# Patient Record
Sex: Female | Born: 1965 | Race: White | Hispanic: No | Smoking: Current every day smoker
Health system: Southern US, Community
[De-identification: ages and names within clinical notes are randomized; demographics above are authoritative.]

## PROBLEM LIST (undated history)

## (undated) DIAGNOSIS — Z87442 Personal history of urinary calculi: Secondary | ICD-10-CM

## (undated) DIAGNOSIS — G4730 Sleep apnea, unspecified: Secondary | ICD-10-CM

## (undated) DIAGNOSIS — R519 Headache, unspecified: Secondary | ICD-10-CM

## (undated) DIAGNOSIS — I639 Cerebral infarction, unspecified: Secondary | ICD-10-CM

## (undated) DIAGNOSIS — M5430 Sciatica, unspecified side: Secondary | ICD-10-CM

## (undated) DIAGNOSIS — K589 Irritable bowel syndrome without diarrhea: Secondary | ICD-10-CM

## (undated) DIAGNOSIS — R51 Headache: Secondary | ICD-10-CM

## (undated) DIAGNOSIS — I10 Essential (primary) hypertension: Secondary | ICD-10-CM

## (undated) DIAGNOSIS — E119 Type 2 diabetes mellitus without complications: Secondary | ICD-10-CM

## (undated) DIAGNOSIS — K5792 Diverticulitis of intestine, part unspecified, without perforation or abscess without bleeding: Secondary | ICD-10-CM

## (undated) DIAGNOSIS — N200 Calculus of kidney: Secondary | ICD-10-CM

## (undated) DIAGNOSIS — B1920 Unspecified viral hepatitis C without hepatic coma: Secondary | ICD-10-CM

## (undated) DIAGNOSIS — T884XXA Failed or difficult intubation, initial encounter: Secondary | ICD-10-CM

## (undated) HISTORY — DX: Cerebral infarction, unspecified: I63.9

## (undated) HISTORY — PX: ABDOMINAL HYSTERECTOMY: SHX81

## (undated) HISTORY — DX: Headache, unspecified: R51.9

## (undated) HISTORY — PX: LITHOTRIPSY: SUR834

## (undated) HISTORY — PX: TONSILLECTOMY: SUR1361

## (undated) HISTORY — DX: Headache: R51

## (undated) HISTORY — PX: OOPHORECTOMY: SHX86

## (undated) HISTORY — PX: SINUS EXPLORATION: SHX5214

---

## 2000-11-29 ENCOUNTER — Inpatient Hospital Stay (HOSPITAL_COMMUNITY): Admission: EM | Admit: 2000-11-29 | Discharge: 2000-12-04 | Payer: Self-pay | Admitting: *Deleted

## 2000-12-19 ENCOUNTER — Emergency Department (HOSPITAL_COMMUNITY): Admission: EM | Admit: 2000-12-19 | Discharge: 2000-12-19 | Payer: Self-pay | Admitting: Emergency Medicine

## 2001-02-16 ENCOUNTER — Emergency Department (HOSPITAL_COMMUNITY): Admission: EM | Admit: 2001-02-16 | Discharge: 2001-02-16 | Payer: Self-pay | Admitting: Emergency Medicine

## 2005-01-08 ENCOUNTER — Ambulatory Visit: Payer: Self-pay | Admitting: Internal Medicine

## 2005-09-09 HISTORY — PX: ABDOMINAL HYSTERECTOMY: SHX81

## 2005-10-20 ENCOUNTER — Emergency Department (HOSPITAL_COMMUNITY): Admission: EM | Admit: 2005-10-20 | Discharge: 2005-10-20 | Payer: Self-pay | Admitting: Emergency Medicine

## 2005-11-16 ENCOUNTER — Emergency Department (HOSPITAL_COMMUNITY): Admission: EM | Admit: 2005-11-16 | Discharge: 2005-11-16 | Payer: Self-pay | Admitting: *Deleted

## 2005-11-25 ENCOUNTER — Emergency Department (HOSPITAL_COMMUNITY): Admission: EM | Admit: 2005-11-25 | Discharge: 2005-11-25 | Payer: Self-pay | Admitting: Emergency Medicine

## 2006-01-11 ENCOUNTER — Emergency Department: Payer: Self-pay | Admitting: Emergency Medicine

## 2006-01-21 ENCOUNTER — Emergency Department: Payer: Self-pay | Admitting: Emergency Medicine

## 2006-03-29 ENCOUNTER — Emergency Department: Payer: Self-pay | Admitting: Emergency Medicine

## 2006-04-06 ENCOUNTER — Emergency Department (HOSPITAL_COMMUNITY): Admission: EM | Admit: 2006-04-06 | Discharge: 2006-04-06 | Payer: Self-pay | Admitting: Emergency Medicine

## 2006-05-04 ENCOUNTER — Emergency Department: Payer: Self-pay | Admitting: Emergency Medicine

## 2006-06-09 ENCOUNTER — Emergency Department (HOSPITAL_COMMUNITY): Admission: EM | Admit: 2006-06-09 | Discharge: 2006-06-09 | Payer: Self-pay | Admitting: Emergency Medicine

## 2006-06-09 ENCOUNTER — Emergency Department: Payer: Self-pay

## 2006-06-12 ENCOUNTER — Emergency Department (HOSPITAL_COMMUNITY): Admission: EM | Admit: 2006-06-12 | Discharge: 2006-06-12 | Payer: Self-pay | Admitting: Emergency Medicine

## 2006-06-29 ENCOUNTER — Emergency Department (HOSPITAL_COMMUNITY): Admission: EM | Admit: 2006-06-29 | Discharge: 2006-06-29 | Payer: Self-pay | Admitting: Emergency Medicine

## 2006-07-08 ENCOUNTER — Emergency Department (HOSPITAL_COMMUNITY): Admission: EM | Admit: 2006-07-08 | Discharge: 2006-07-08 | Payer: Self-pay | Admitting: Emergency Medicine

## 2006-07-12 ENCOUNTER — Emergency Department (HOSPITAL_COMMUNITY): Admission: EM | Admit: 2006-07-12 | Discharge: 2006-07-12 | Payer: Self-pay | Admitting: *Deleted

## 2006-08-05 ENCOUNTER — Emergency Department (HOSPITAL_COMMUNITY): Admission: EM | Admit: 2006-08-05 | Discharge: 2006-08-05 | Payer: Self-pay | Admitting: Emergency Medicine

## 2006-09-08 ENCOUNTER — Emergency Department (HOSPITAL_COMMUNITY): Admission: EM | Admit: 2006-09-08 | Discharge: 2006-09-08 | Payer: Self-pay | Admitting: Emergency Medicine

## 2006-09-29 ENCOUNTER — Emergency Department (HOSPITAL_COMMUNITY): Admission: EM | Admit: 2006-09-29 | Discharge: 2006-09-29 | Payer: Self-pay | Admitting: Emergency Medicine

## 2006-10-11 ENCOUNTER — Emergency Department (HOSPITAL_COMMUNITY): Admission: EM | Admit: 2006-10-11 | Discharge: 2006-10-11 | Payer: Self-pay | Admitting: Emergency Medicine

## 2006-10-13 ENCOUNTER — Emergency Department (HOSPITAL_COMMUNITY): Admission: EM | Admit: 2006-10-13 | Discharge: 2006-10-13 | Payer: Self-pay | Admitting: Emergency Medicine

## 2006-10-19 ENCOUNTER — Emergency Department (HOSPITAL_COMMUNITY): Admission: EM | Admit: 2006-10-19 | Discharge: 2006-10-19 | Payer: Self-pay | Admitting: Emergency Medicine

## 2006-12-14 ENCOUNTER — Emergency Department (HOSPITAL_COMMUNITY): Admission: EM | Admit: 2006-12-14 | Discharge: 2006-12-14 | Payer: Self-pay | Admitting: Emergency Medicine

## 2006-12-19 ENCOUNTER — Emergency Department (HOSPITAL_COMMUNITY): Admission: EM | Admit: 2006-12-19 | Discharge: 2006-12-19 | Payer: Self-pay | Admitting: Emergency Medicine

## 2007-01-25 ENCOUNTER — Emergency Department (HOSPITAL_COMMUNITY): Admission: EM | Admit: 2007-01-25 | Discharge: 2007-01-25 | Payer: Self-pay | Admitting: Emergency Medicine

## 2007-02-05 ENCOUNTER — Emergency Department: Payer: Self-pay | Admitting: Emergency Medicine

## 2007-02-05 ENCOUNTER — Other Ambulatory Visit: Payer: Self-pay

## 2007-03-03 ENCOUNTER — Emergency Department: Payer: Self-pay | Admitting: Emergency Medicine

## 2007-04-18 ENCOUNTER — Emergency Department (HOSPITAL_COMMUNITY): Admission: EM | Admit: 2007-04-18 | Discharge: 2007-04-18 | Payer: Self-pay | Admitting: Emergency Medicine

## 2007-04-23 ENCOUNTER — Emergency Department: Payer: Self-pay

## 2007-04-23 ENCOUNTER — Emergency Department: Payer: Self-pay | Admitting: Emergency Medicine

## 2007-05-06 ENCOUNTER — Ambulatory Visit: Payer: Self-pay | Admitting: Obstetrics & Gynecology

## 2007-05-14 ENCOUNTER — Inpatient Hospital Stay: Payer: Self-pay | Admitting: Obstetrics & Gynecology

## 2007-10-24 ENCOUNTER — Emergency Department (HOSPITAL_COMMUNITY): Admission: EM | Admit: 2007-10-24 | Discharge: 2007-10-24 | Payer: Self-pay | Admitting: Emergency Medicine

## 2007-11-20 ENCOUNTER — Emergency Department: Payer: Self-pay | Admitting: Emergency Medicine

## 2007-11-20 IMAGING — CR DG LUMBAR SPINE COMPLETE 4+V
5 series · 5 of 5 positions shown · non-contrast
Comparison: none

CLINICAL DATA: Low back pain radiating to right leg.
LUMBAR SPINE ? 4 VIEW:

[t l-spine a.p. *]
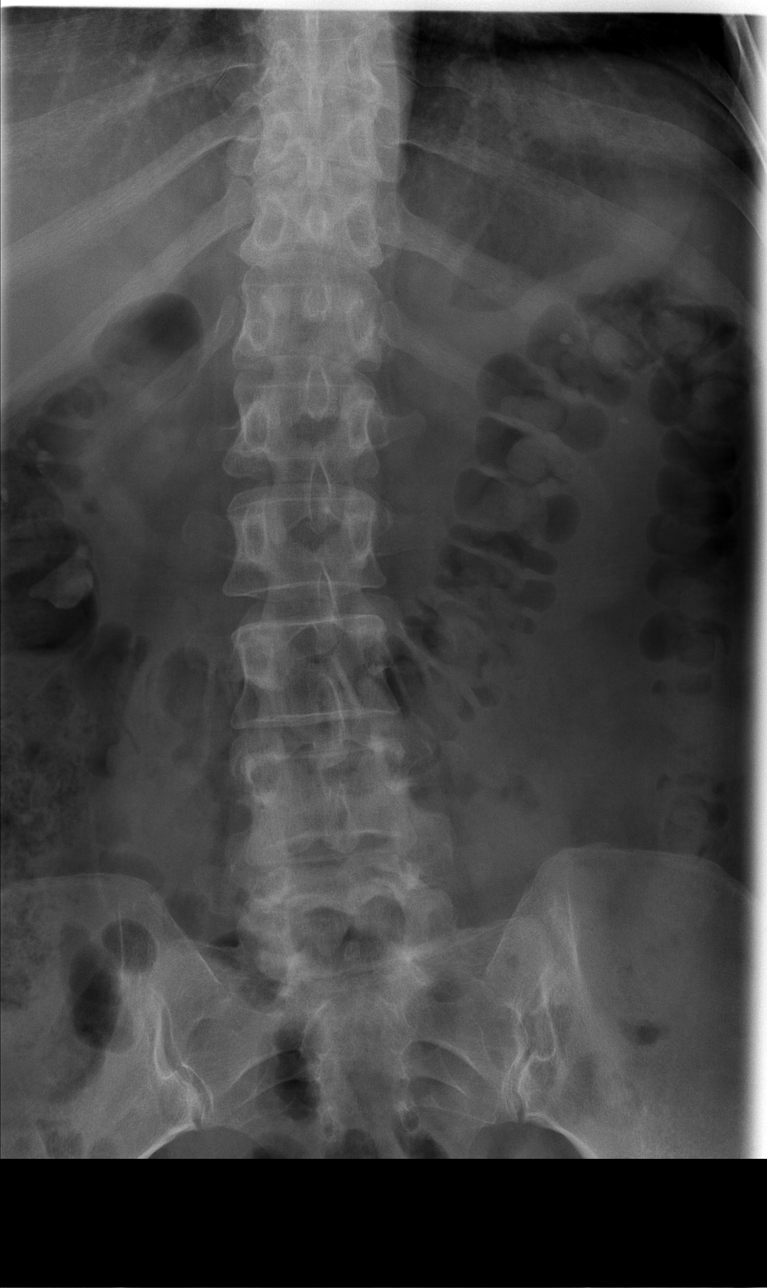

[t l-spine oblique exposure * (1 of 2)]
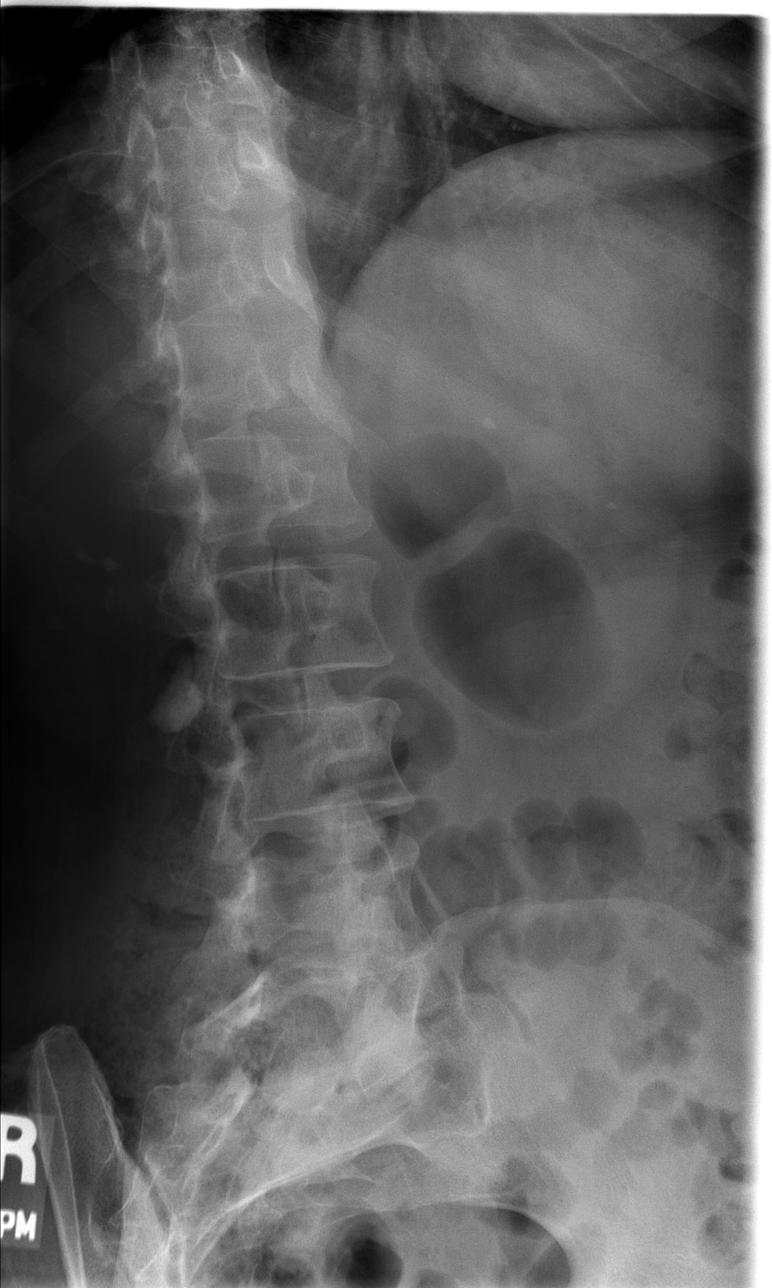

[t l-spine oblique exposure * (2 of 2)]
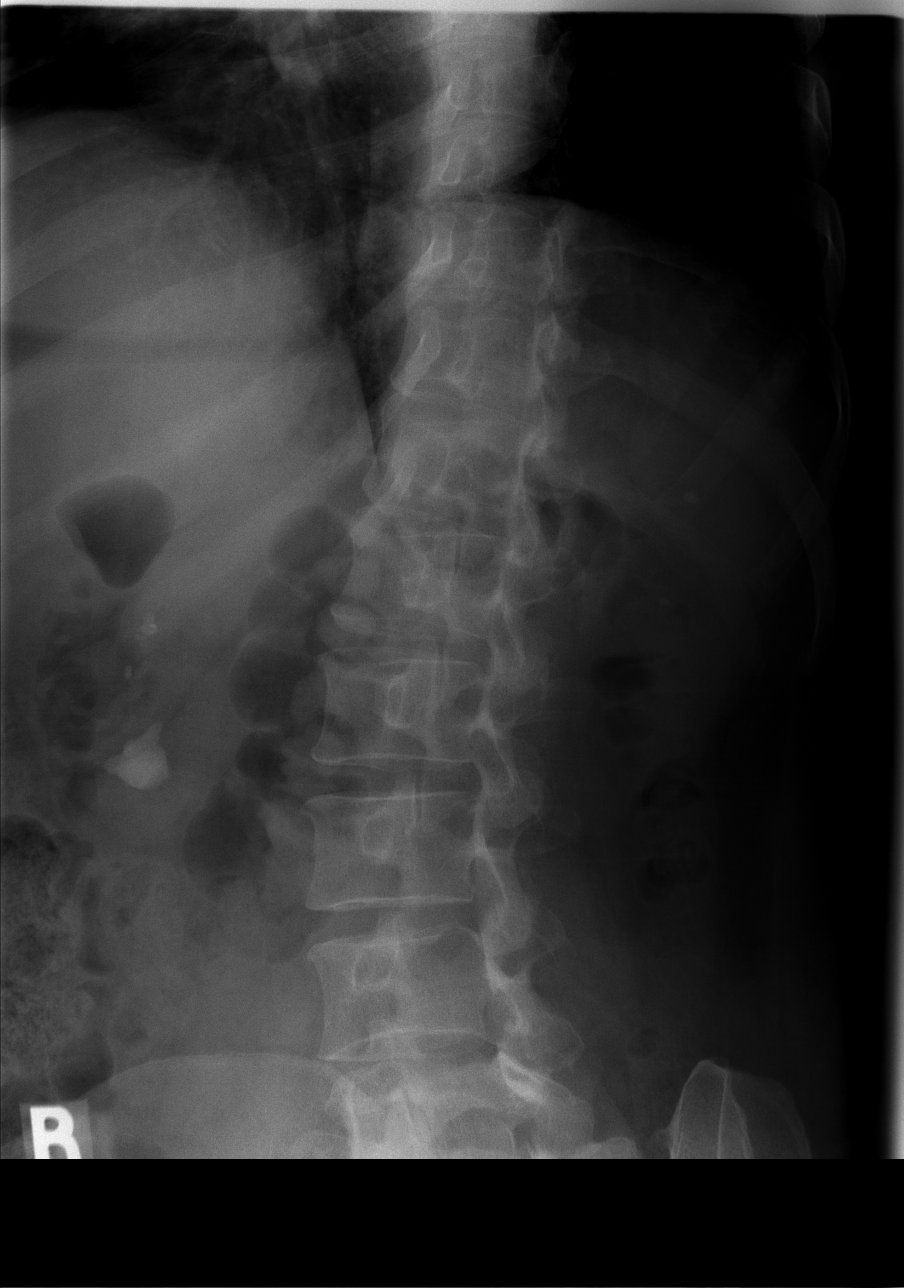

[t l-spine lat *]
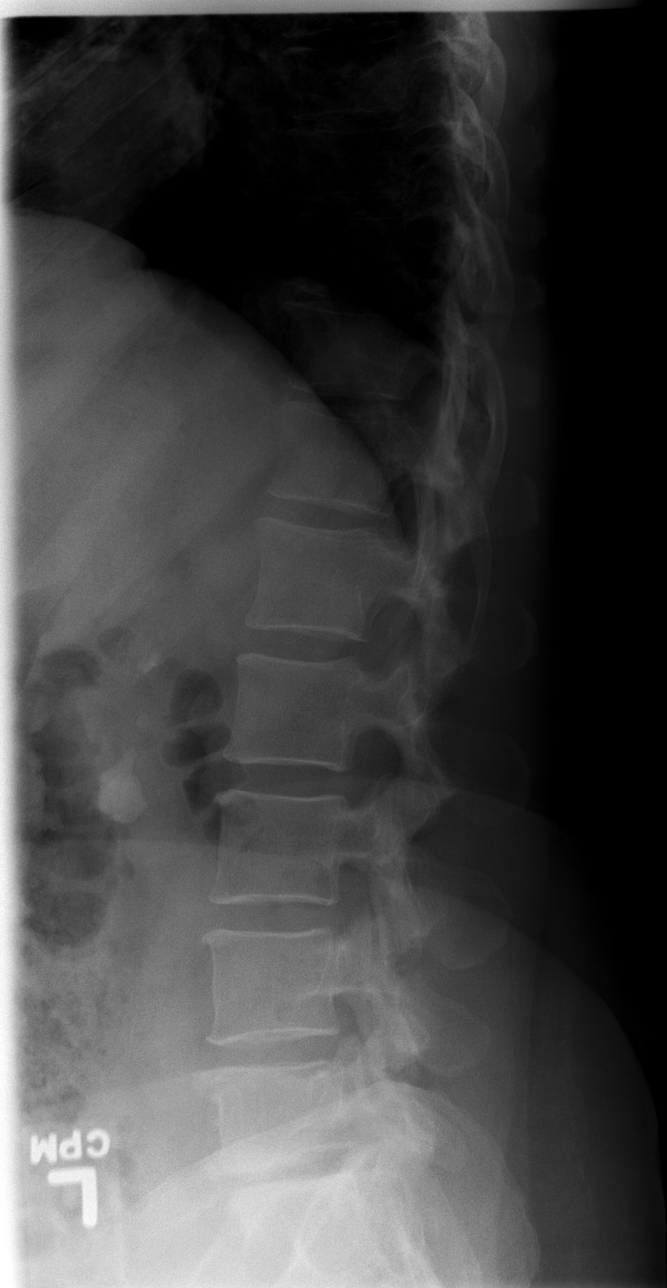

[t l-spine l5-s1 spot *]
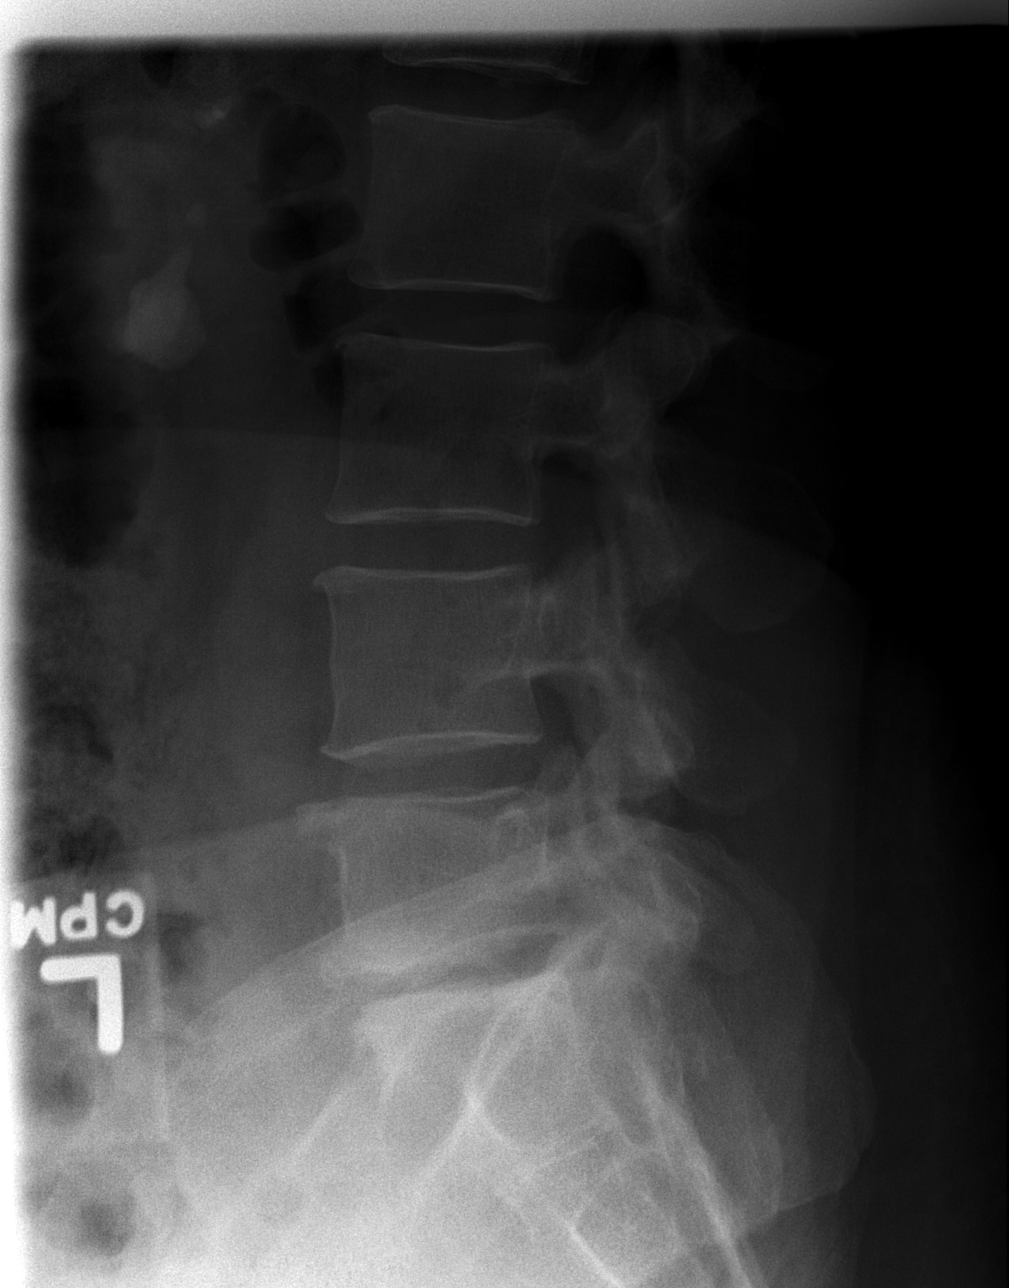

[5 of 5 positions shown; findings below may reference images not displayed]

FINDINGS: There is no evidence of lumbar spine fracture, spondylolysis, or spondylolisthesis.
Mild degenerative disk disease is seen at L4-5 and moderate degenerative disk disease is seen at L5-S1.  Facet DJD is seen bilaterally at L3-4, L4-5, and L5-S1.
Multiple right renal calculi are seen, the largest in the lower pole measuring 1.8 cm.
IMPRESSION: 1.  No acute findings.
2.  Lower lumbar spine degenerative disk disease and facet DJD, as described above.
3.  Multiple right intrarenal calculi.

## 2007-12-09 ENCOUNTER — Emergency Department: Payer: Self-pay | Admitting: Emergency Medicine

## 2008-01-21 ENCOUNTER — Emergency Department: Payer: Self-pay | Admitting: Emergency Medicine

## 2008-01-21 ENCOUNTER — Other Ambulatory Visit: Payer: Self-pay

## 2008-01-25 ENCOUNTER — Emergency Department: Payer: Self-pay | Admitting: Emergency Medicine

## 2008-04-25 ENCOUNTER — Emergency Department: Payer: Self-pay | Admitting: Emergency Medicine

## 2008-04-26 ENCOUNTER — Emergency Department: Payer: Self-pay | Admitting: Emergency Medicine

## 2008-06-03 IMAGING — CR LEFT MIDDLE FINGER 2+V
1 series · 3 of 3 positions shown · non-contrast
Comparison: none

REASON FOR EXAM: INJURY
COMMENTS:

PROCEDURE:     DXR - DXR FINGER MID 3RD DIGIT LT HAND  - May 04, 2006 [DATE]
RESULT:     There does not appear to be evidence of fracture, dislocation or
malalignment.

[Series 1: view not recorded · 0.17mm/px · 3 of 3 slices shown]
[im 1/3]
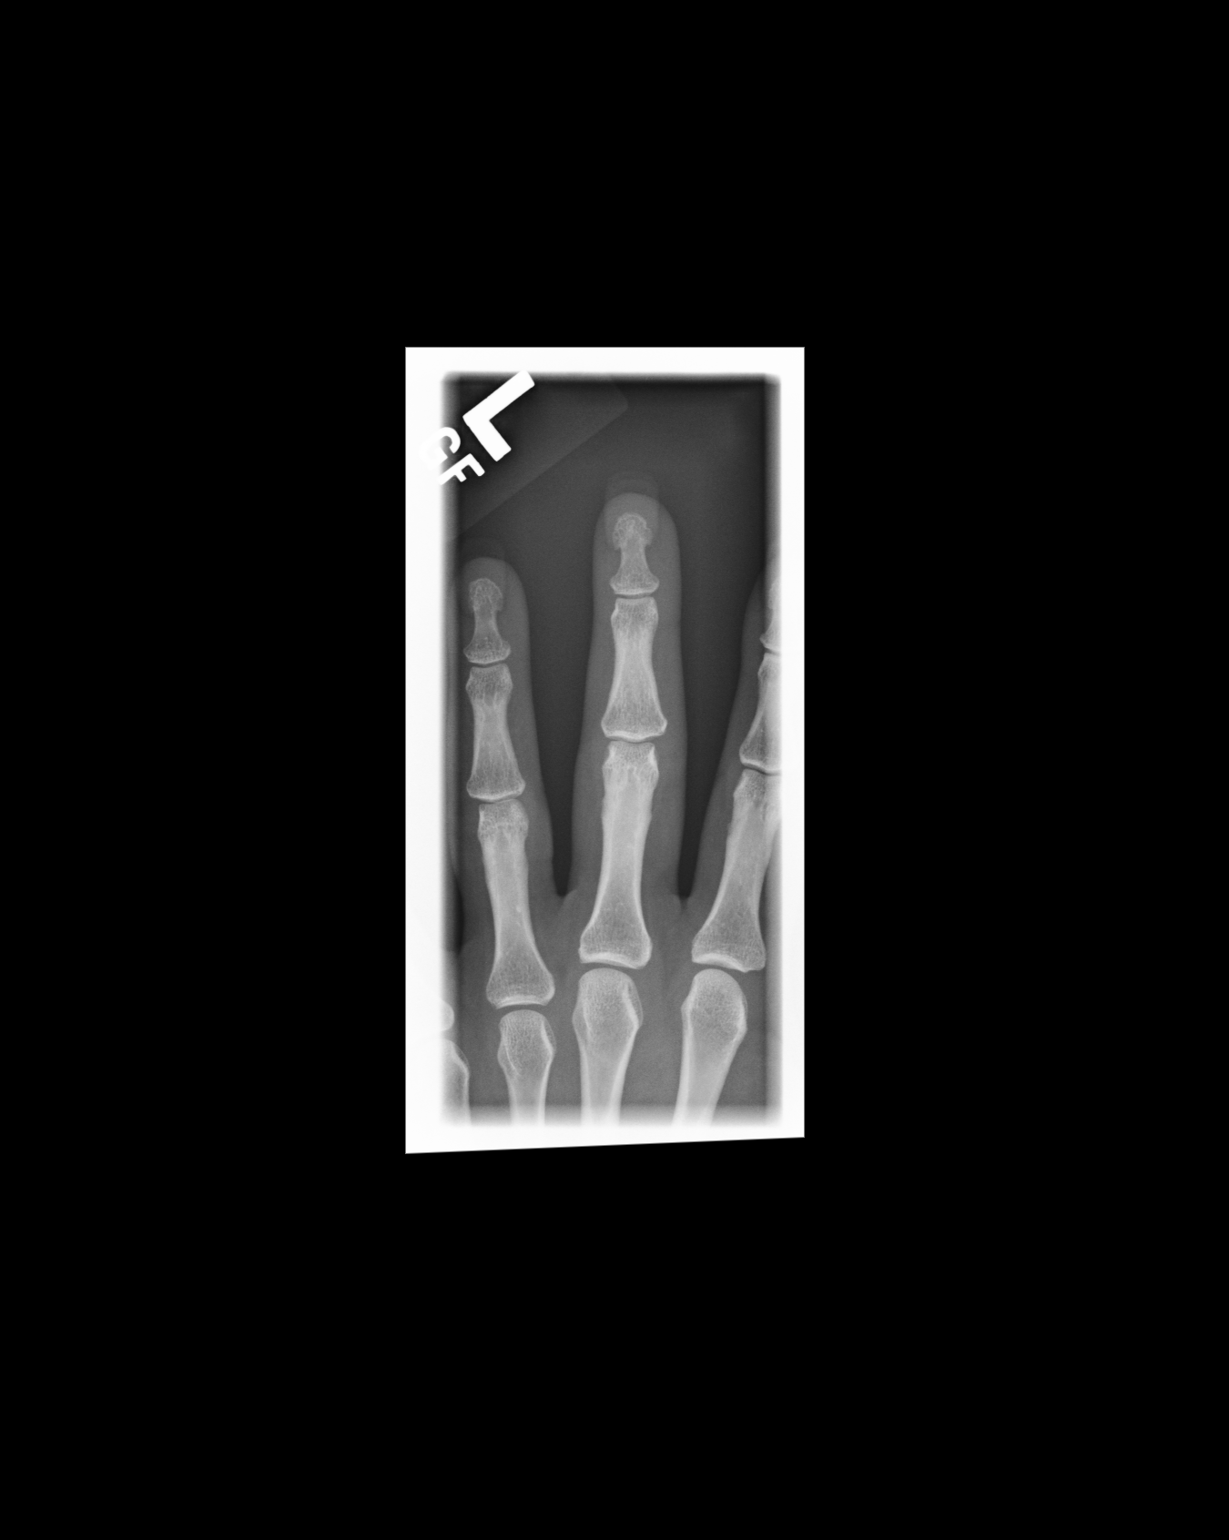
[im 2/3]
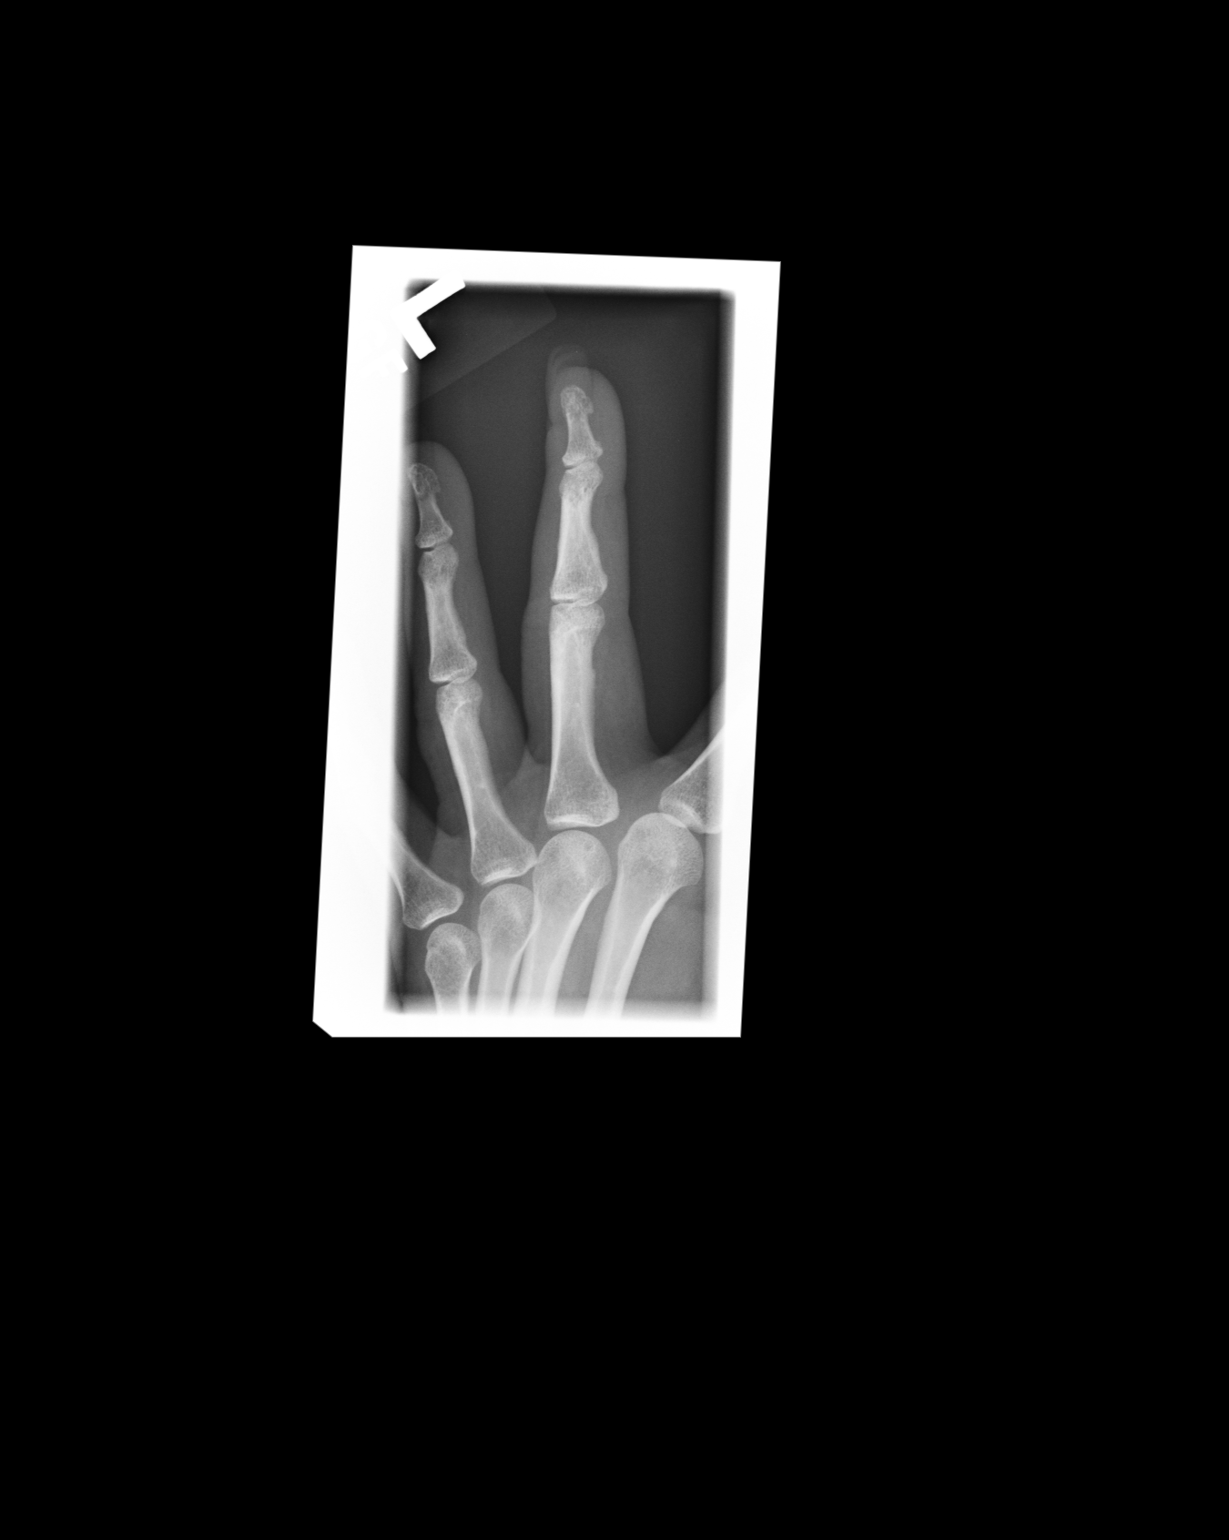
[im 3/3]
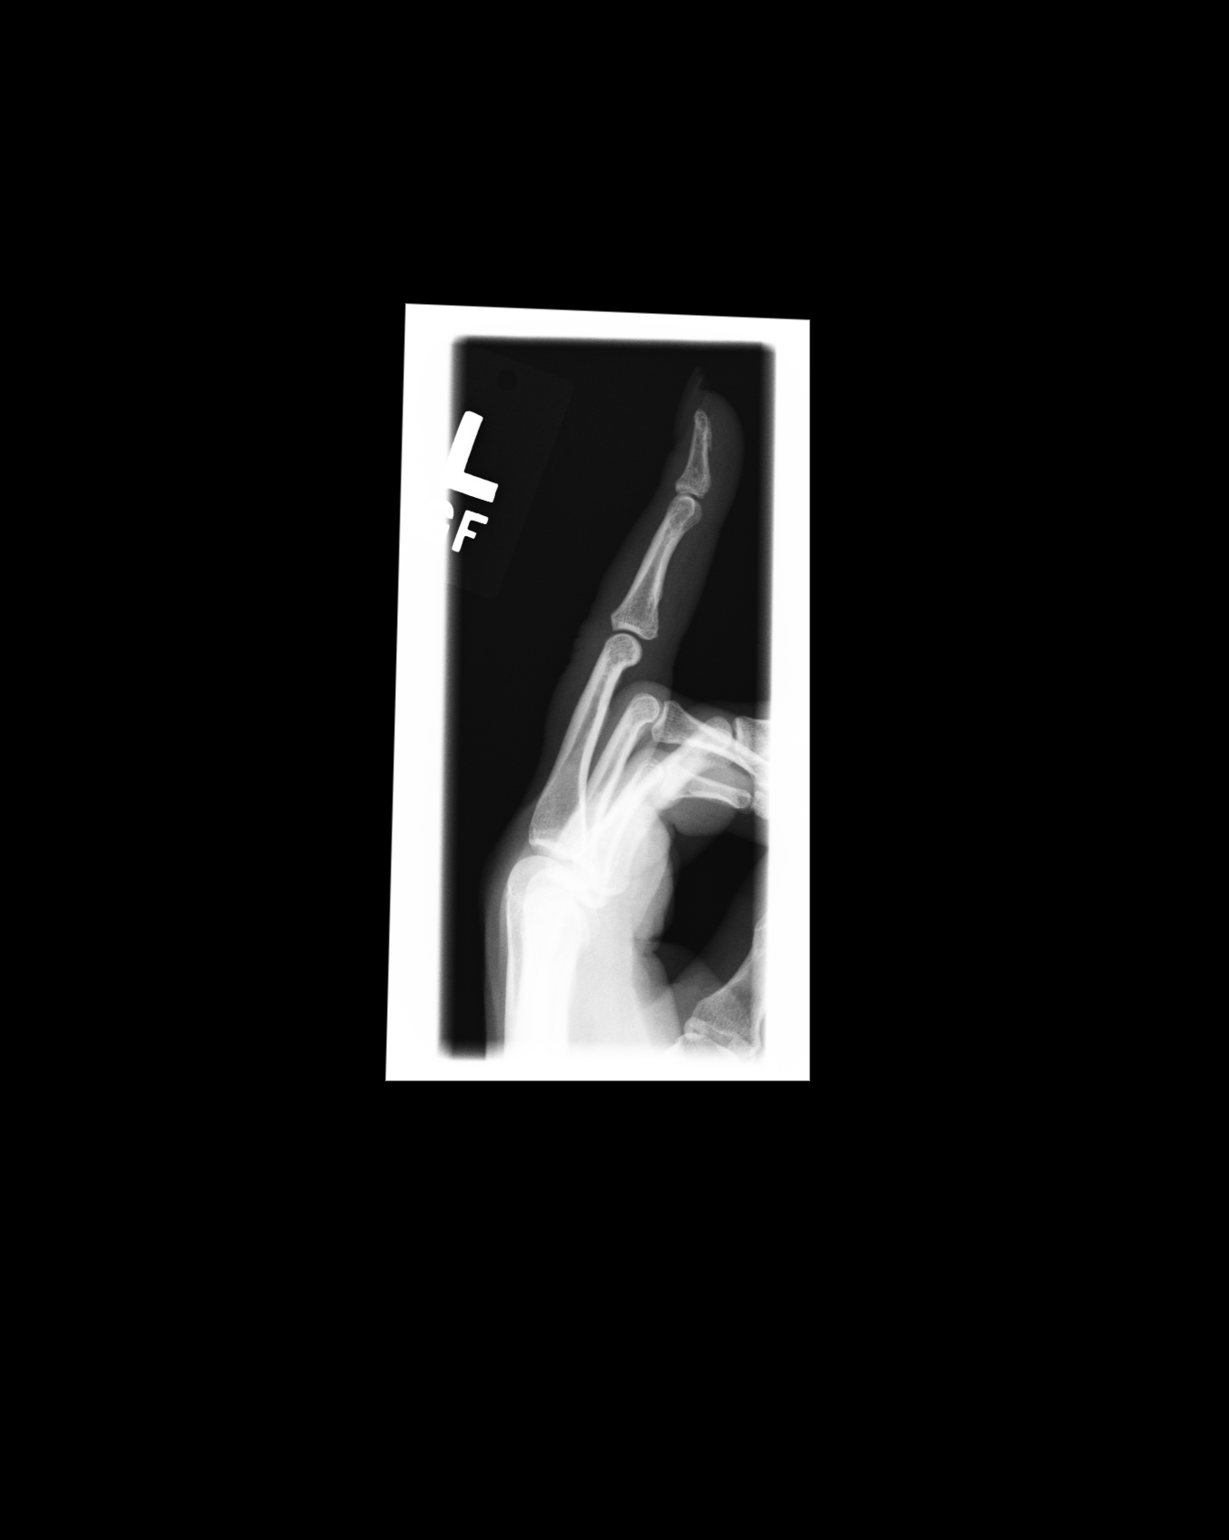

[3 of 3 positions shown; findings below may reference images not displayed]

IMPRESSION: 1.     Unremarkable LEFT, third finger.
2.     If there is persistent clinical concern or persistent complaints of
pain, repeat evaluation in 7-10 days is recommended, if clinically
warranted.

## 2008-08-09 ENCOUNTER — Emergency Department: Payer: Self-pay

## 2008-12-24 ENCOUNTER — Emergency Department: Payer: Self-pay | Admitting: Emergency Medicine

## 2009-03-07 IMAGING — CR DG CHEST 2V
1 series · 2 of 2 positions shown · non-contrast
Comparison: none

REASON FOR EXAM: CP
COMMENTS:

[Series 1: view not recorded · 0.17mm/px · 2 of 2 slices shown]
[im 1/2]
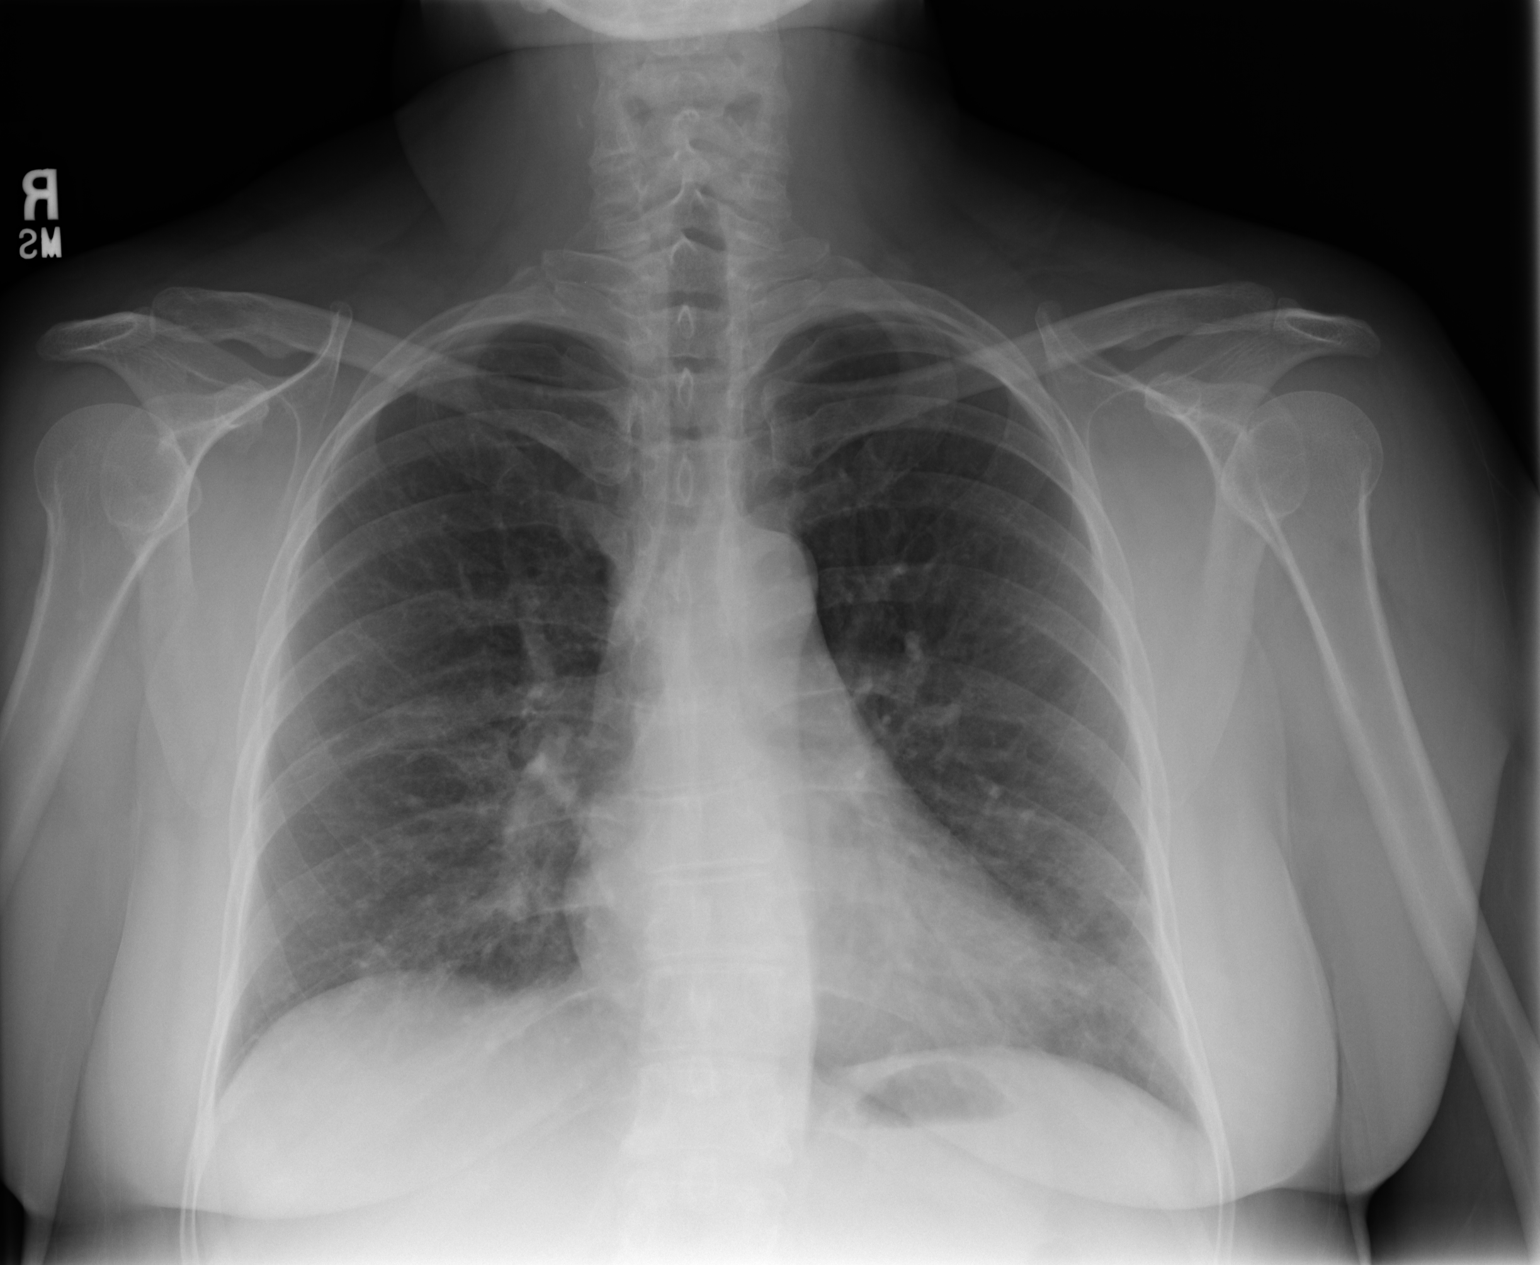
[im 2/2]
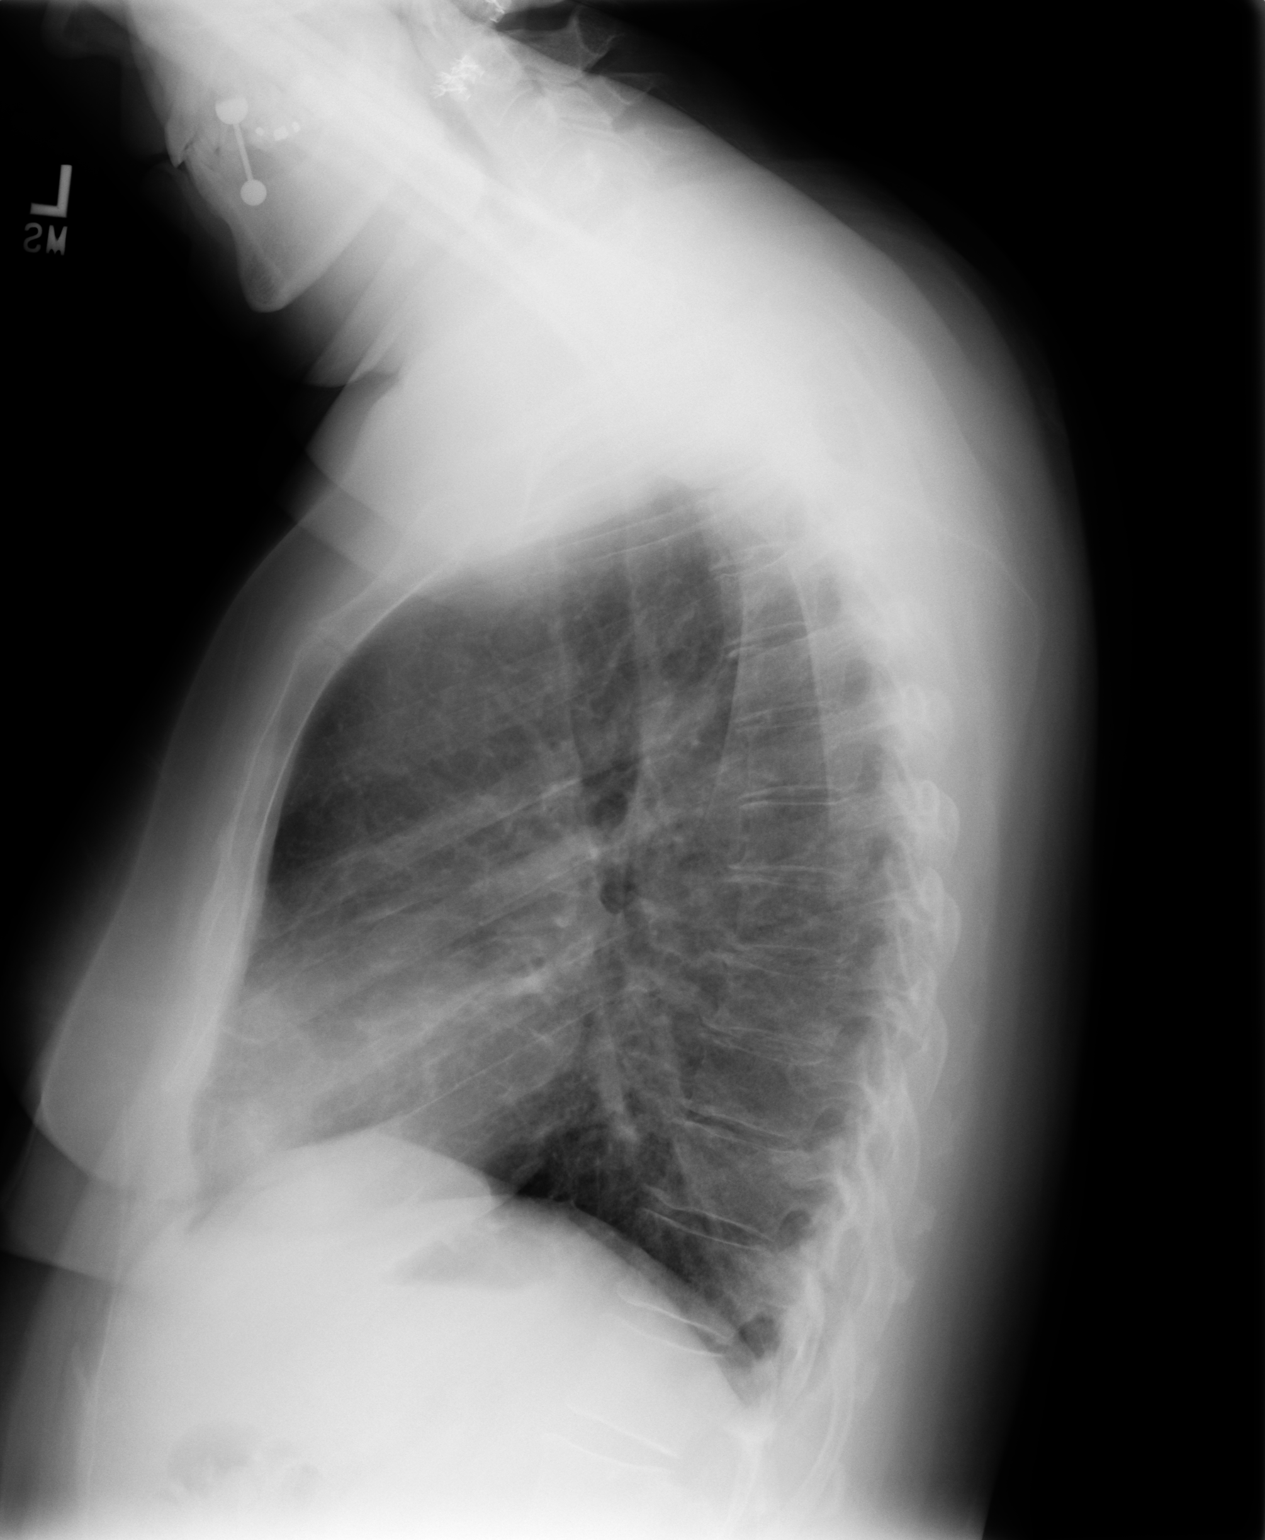

[2 of 2 positions shown; findings below may reference images not displayed]

PROCEDURE:     DXR - DXR CHEST PA (OR AP) AND LATERAL  - February 05, 2007  [DATE]

RESULT:     Two views of the chest demonstrate mild hyperinflation. There is
slight loss of visualization of the heart border near the left lung apex
which may represent some minimal lingular infiltrate. Early pneumonia is
felt to be less likely. There is also subtle increased density on the
lateral view in this region. There is no effusion. There is no pneumothorax.
IMPRESSION: Minimal lingular atelectasis likely present. Early or
resolving pneumonia cannot be completely excluded.

## 2009-04-10 ENCOUNTER — Emergency Department (HOSPITAL_COMMUNITY): Admission: EM | Admit: 2009-04-10 | Discharge: 2009-04-10 | Payer: Self-pay | Admitting: Emergency Medicine

## 2009-05-23 IMAGING — CT CT STONE STUDY
1 of 2 series · 15 of 32 positions shown, 19 images · non-contrast
Comparison: none

RESULT:      Comparison: No available comparison exam.
TECHNIQUE: CT examination of the abdomen and pelvis was performed without
contrast. Collimation is 3 mm.

[Series 2: stone · axial · 0.65mm/px · z∈[+184,+598]mm · 15 of 150 slices shown, 19 images]
[im 6/150  soft-tissue]
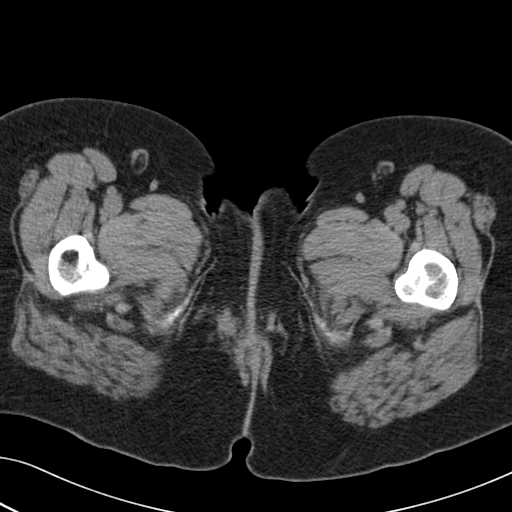
[im 6/150  bone]
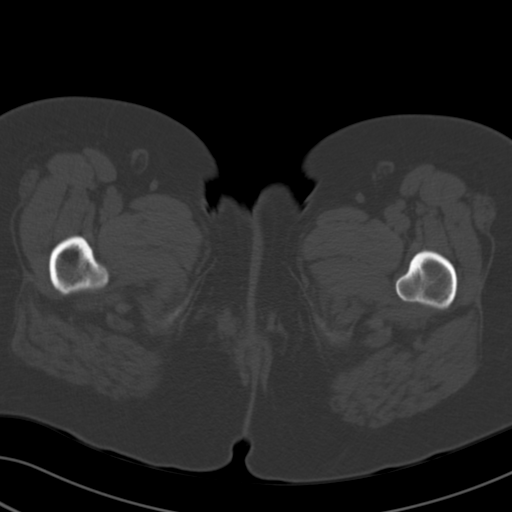
[im 18/150  soft-tissue]
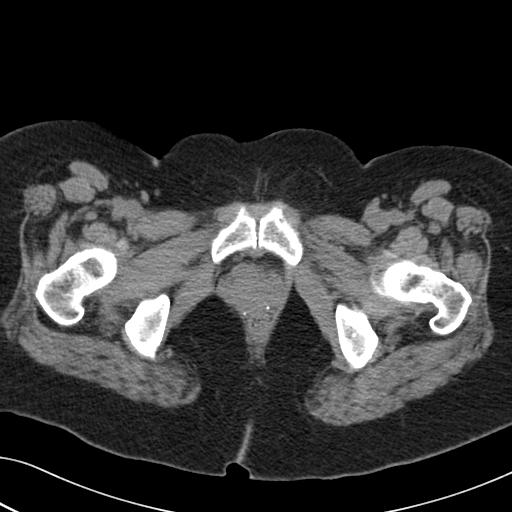
[im 29/150  soft-tissue]
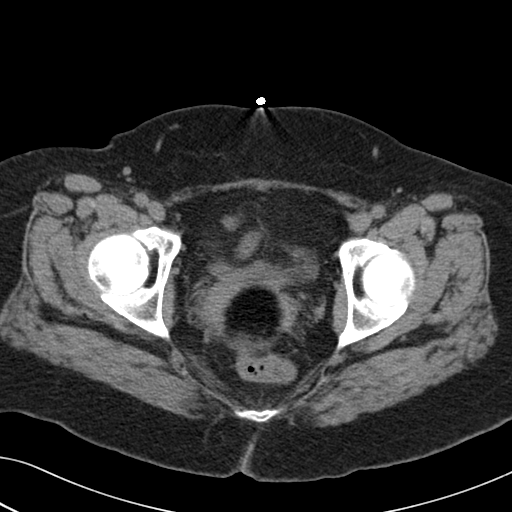
[im 41/150  soft-tissue]
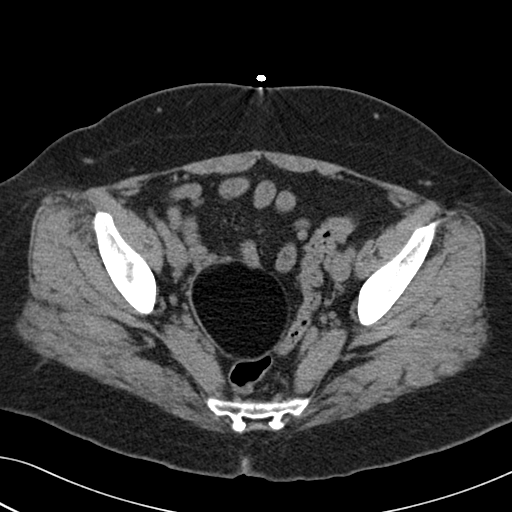
[im 52/150  soft-tissue]
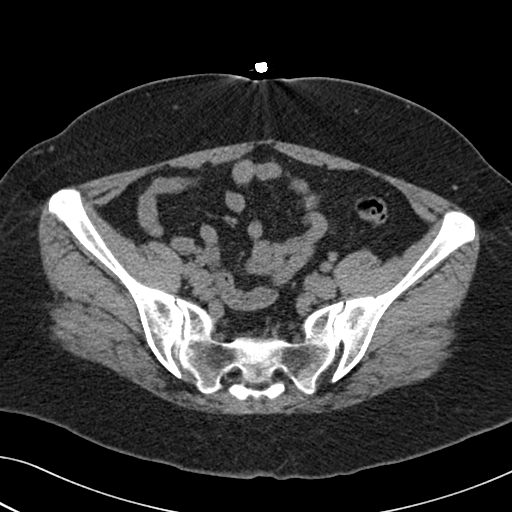
[im 64/150  soft-tissue]
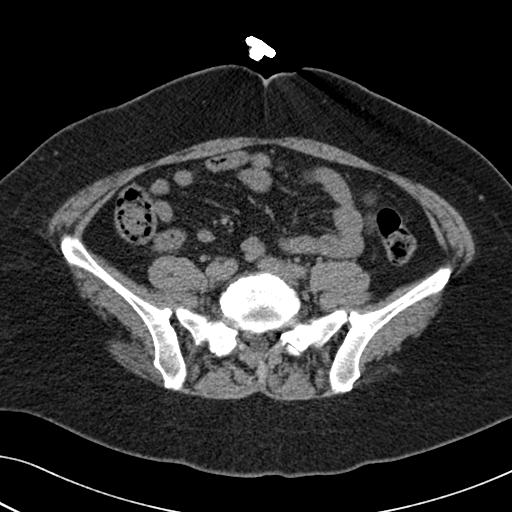
[im 75/150  soft-tissue]
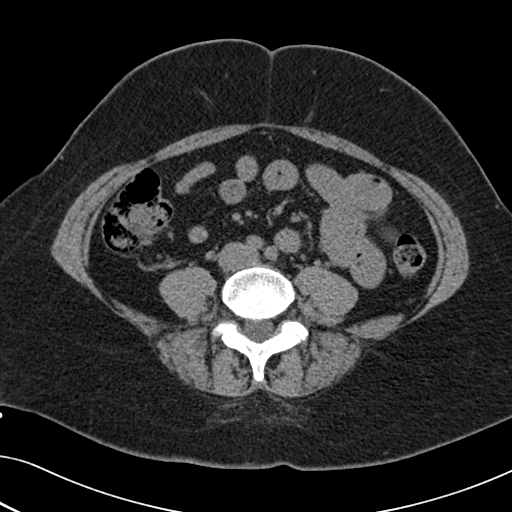
[im 86/150  soft-tissue]
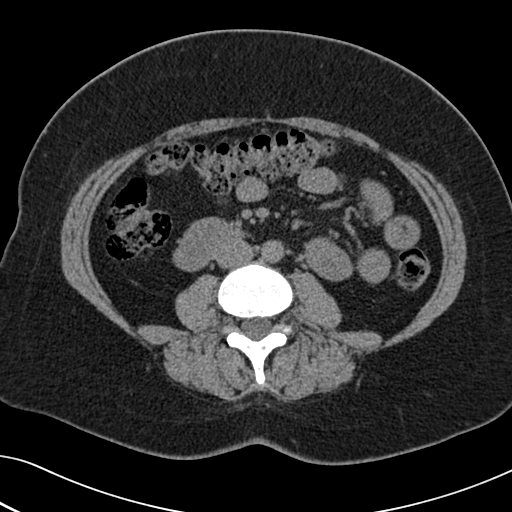
[im 98/150  soft-tissue]
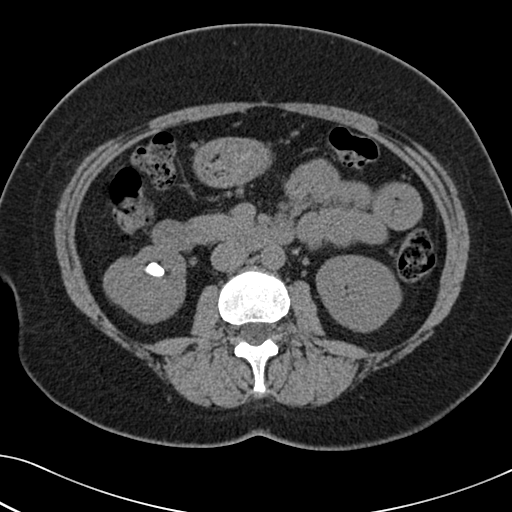
[im 98/150  bone]
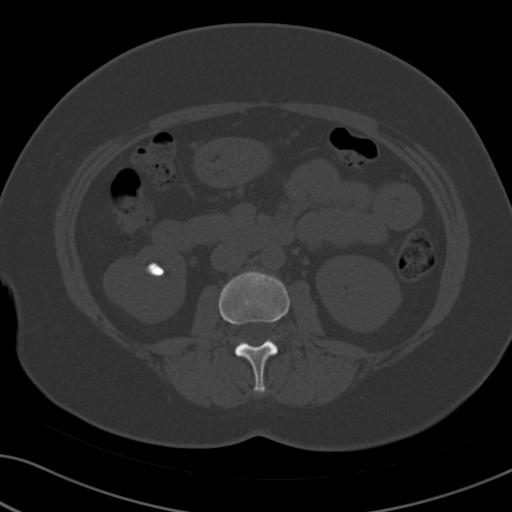
[im 109/150  soft-tissue]
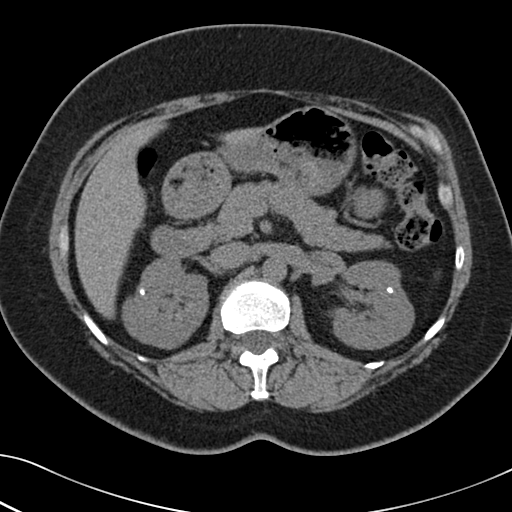
[im 121/150  soft-tissue]
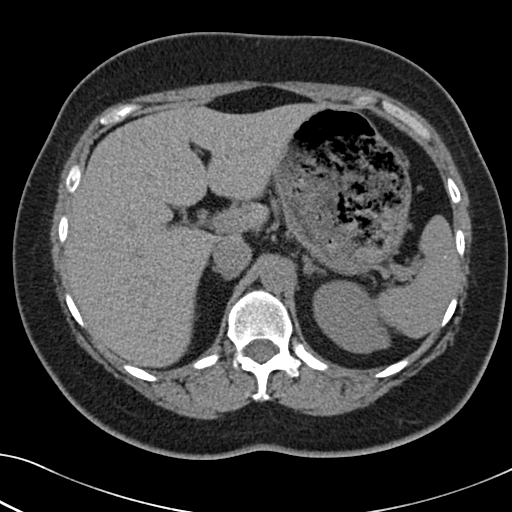
[im 127/150  lung]
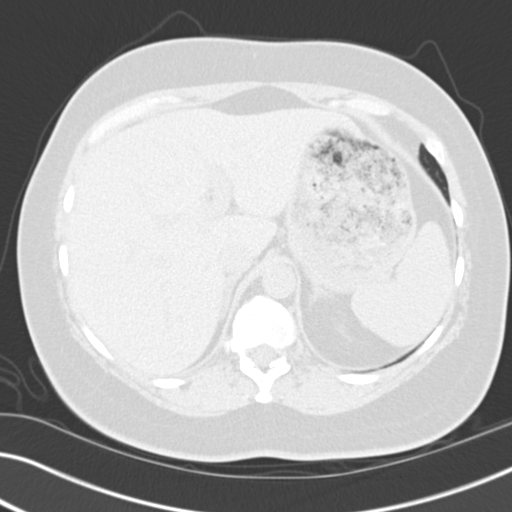
[im 132/150  soft-tissue]
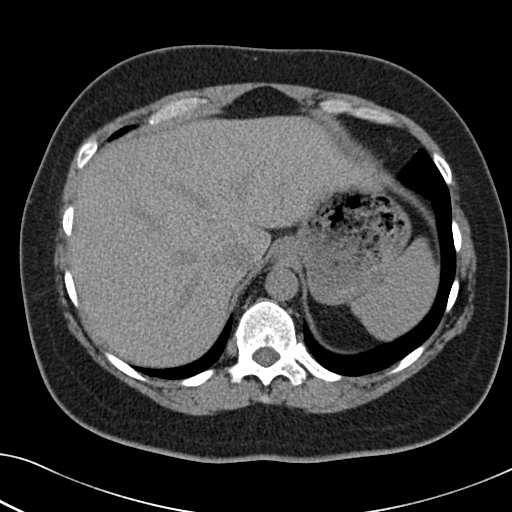
[im 132/150  lung]
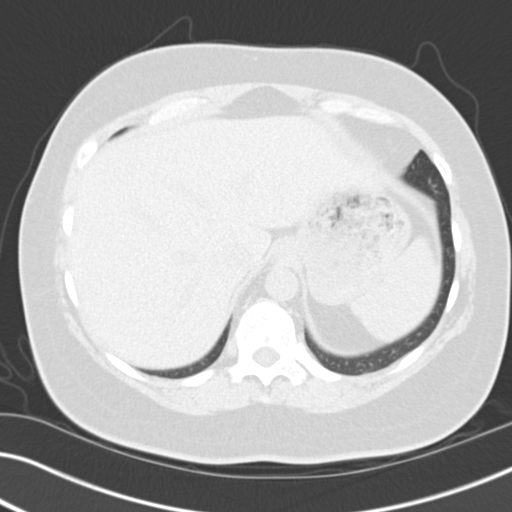
[im 138/150  lung]
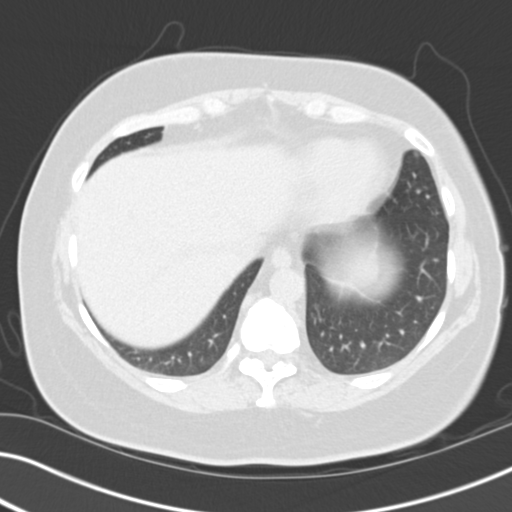
[im 144/150  soft-tissue]
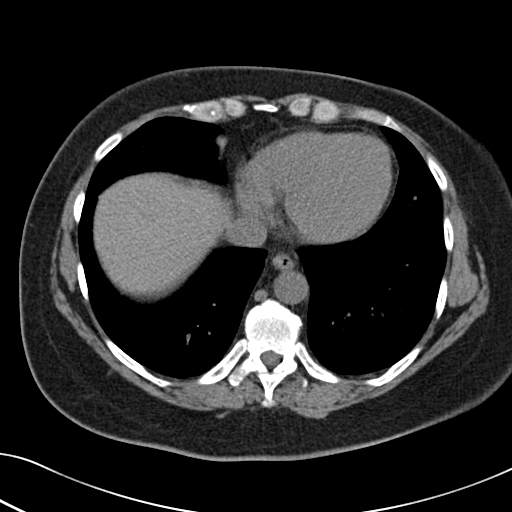
[im 144/150  lung]
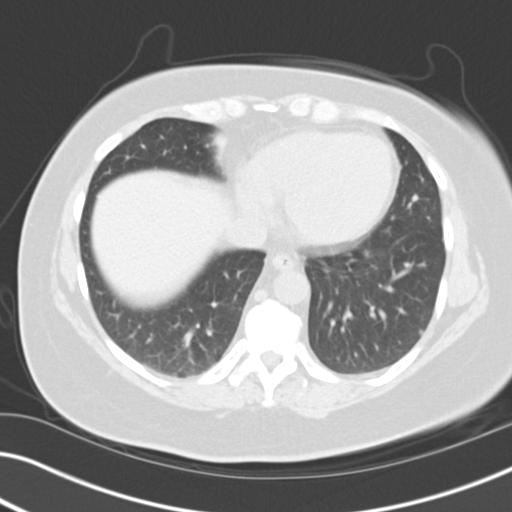

[15 of 32 positions shown; findings below may reference images not displayed]

FINDINGS: Limited evaluation the lung bases shows a 4 mm indeterminate nodular opacity
involving the left lung base on image 7.

Evaluation of the abdominal organs, bowels, and vessels is limited without
contrast. The liver, gallbladder, spleen, pancreas, and adrenal glands are
grossly unremarkable. Multiple bilateral nonobstructive renal stones are
noted. The largest in the right lower pole measures 1 x 1.9 cm. There is no
ureteral stone. There is no hydronephrosis.

There is no dilatation of the bowels. The appendix is unremarkable. There is
no intraperitoneal free air. There is no significant free fluid. There are
no enlarged abdominal pelvic lymph nodes. A 6.5 x 6.2 cm predominantly fat
attenuation circumscribed lesion extending from the right adnexa is noted
with a small amount of peripheral calcification. This likely represents a
teratoma/dermoid.
IMPRESSION: 1. 6.5 x 6.2 cm predominantly fat attenuation circumscribed lesion with
small amount of peripheral calcification extending from the right adnexa
likely represents a teratoma/dermoid.
2. Nonobstructive bilateral renal calculi.
3. 4 mm indeterminate nodular opacity involving the left lung base can be
followed with chest CT in 6-12 months depending on patient's risks for lung
cancer.

Preliminary report was faxed to the emergency room by the night radiologist
shortly after the study was performed.

## 2009-10-09 ENCOUNTER — Ambulatory Visit: Payer: Self-pay

## 2009-11-23 IMAGING — CR DG CHEST 2V
2 series · 2 of 2 positions shown · non-contrast
Comparison: none

CLINICAL DATA: 42 year old female with flu symptoms and migraine.
 CHEST- 2 VIEWS - 10/24/07:

[w chest pa]
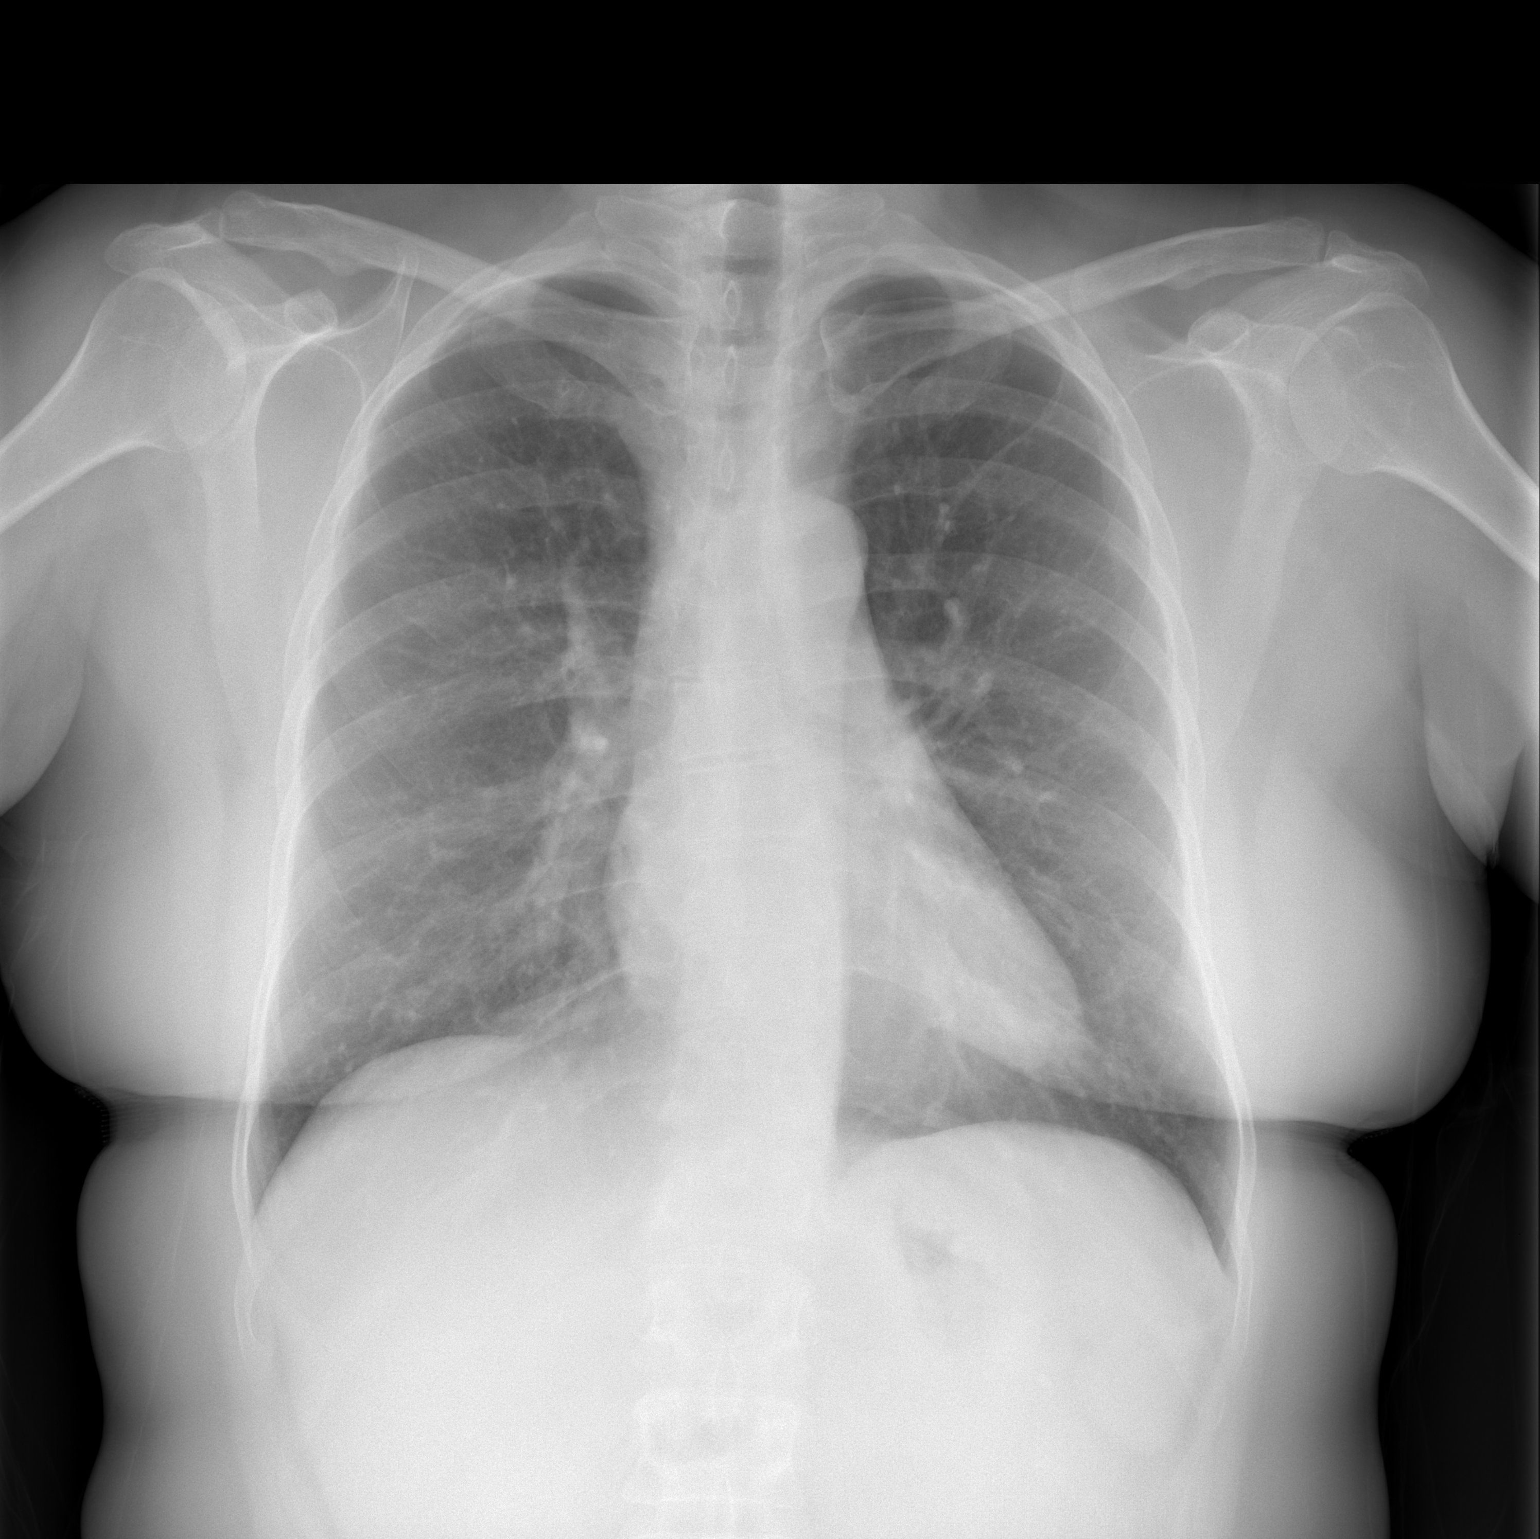

[w chest lat]
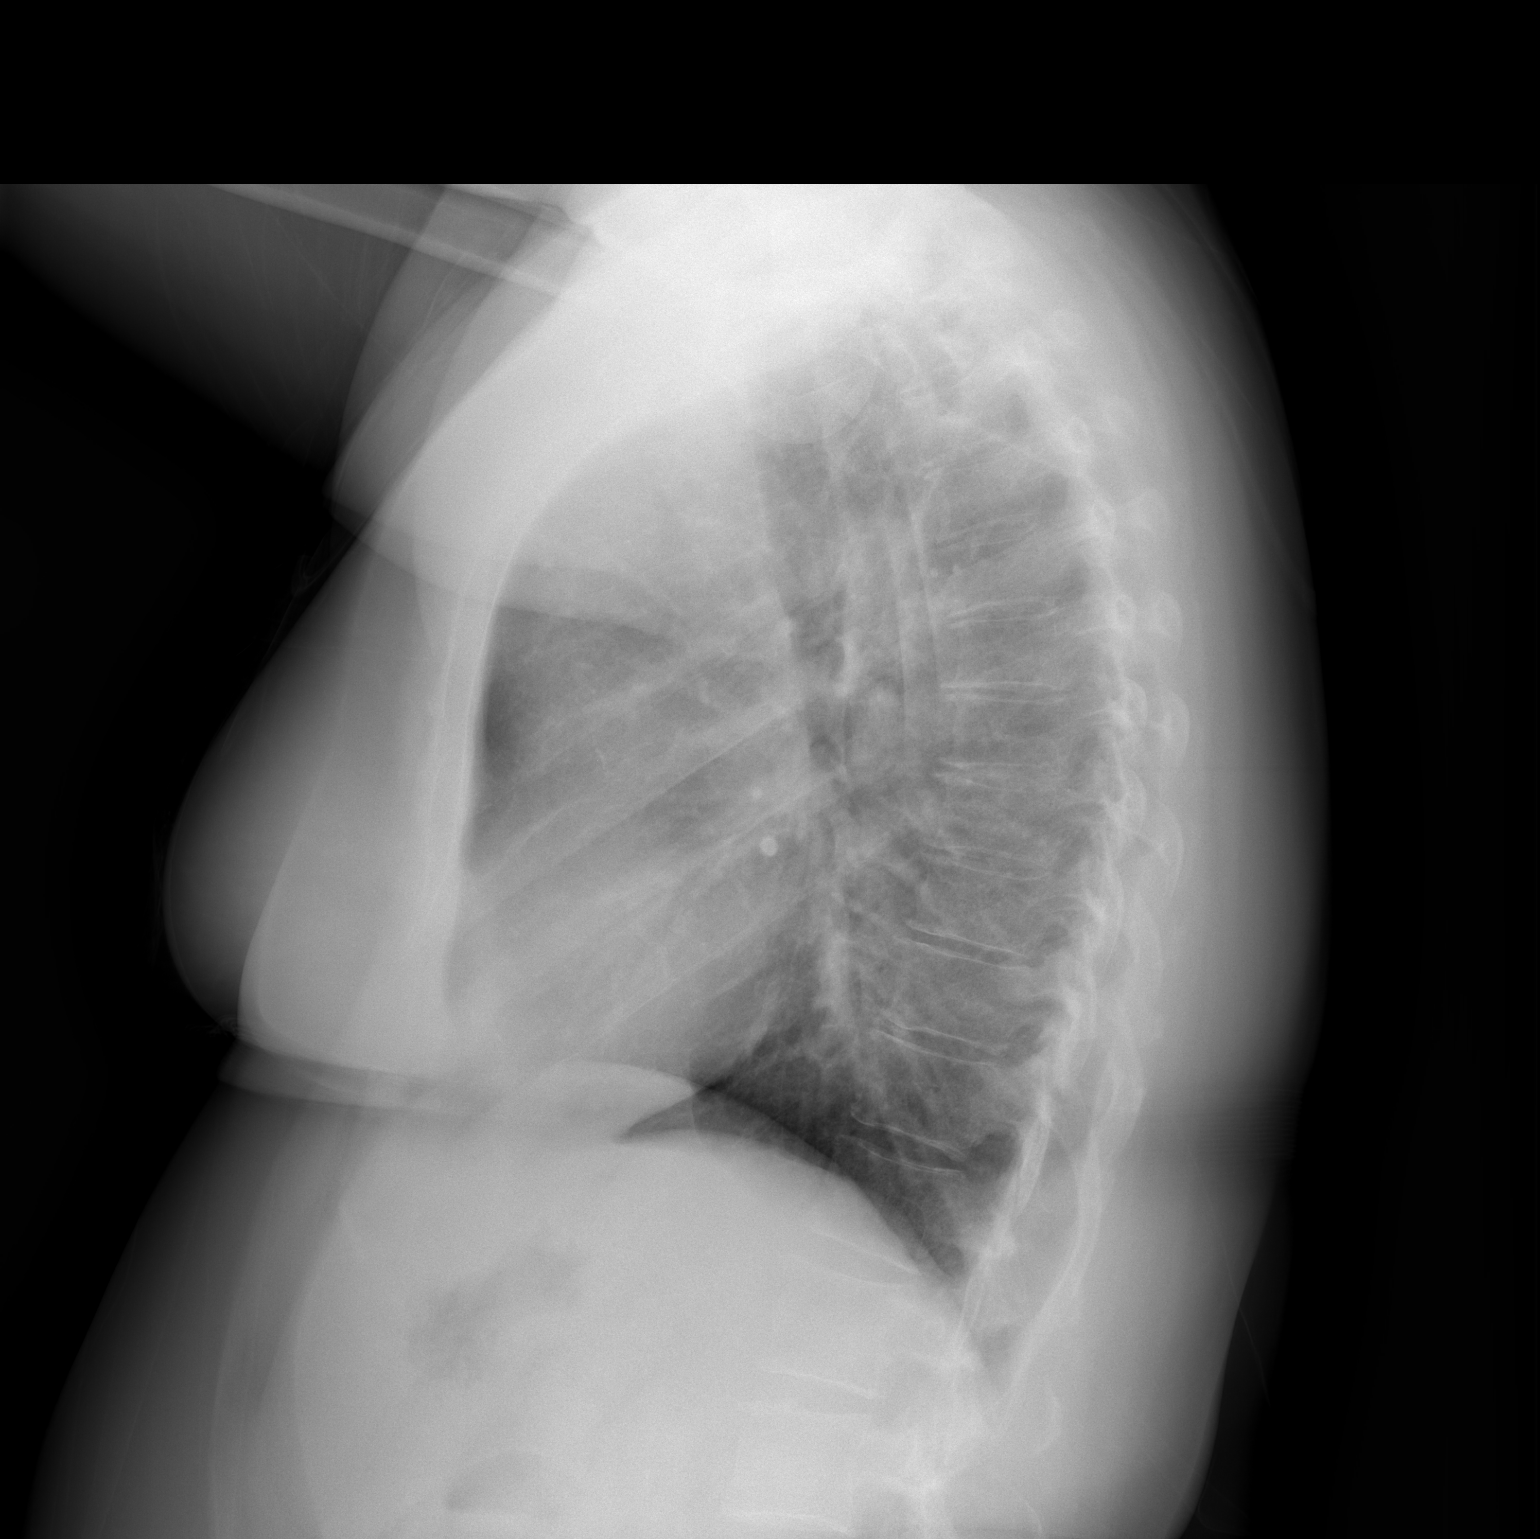

[2 of 2 positions shown; findings below may reference images not displayed]

FINDINGS: Normal mediastinum and cardiac silhouette. The costophrenic angles are clear. There is coarsening of central bronchovascular markings.  There is no evidence of focal infiltrate.  No pulmonary edema.  No pneumothorax.
IMPRESSION: Coarsening of central bronchovascular markings could suggest bronchitis or viral bronchiolitis.

## 2010-04-22 ENCOUNTER — Emergency Department: Payer: Self-pay | Admitting: Internal Medicine

## 2010-07-12 ENCOUNTER — Emergency Department: Payer: Self-pay | Admitting: Emergency Medicine

## 2010-07-27 ENCOUNTER — Emergency Department: Payer: Self-pay | Admitting: Emergency Medicine

## 2010-08-01 ENCOUNTER — Emergency Department: Payer: Self-pay | Admitting: Emergency Medicine

## 2010-08-19 ENCOUNTER — Emergency Department (HOSPITAL_COMMUNITY)
Admission: EM | Admit: 2010-08-19 | Discharge: 2010-08-19 | Payer: Self-pay | Source: Home / Self Care | Admitting: Emergency Medicine

## 2010-09-09 IMAGING — CR DG CHEST 2V
1 series · 2 of 2 positions shown · non-contrast
Comparison: none

REASON FOR EXAM: Chest Pain
COMMENTS:

PROCEDURE:     DXR - DXR CHEST PA (OR AP) AND LATERAL  - August 09, 2008  [DATE]
RESULT:     Comparison is made to the prior exam of 02/05/2007. The lung
fields are clear. Heart, mediastinal and osseous structures show no
significant abnormalities.

[Series 1: view not recorded · 0.17mm/px · 2 of 2 slices shown]
[im 1/2]
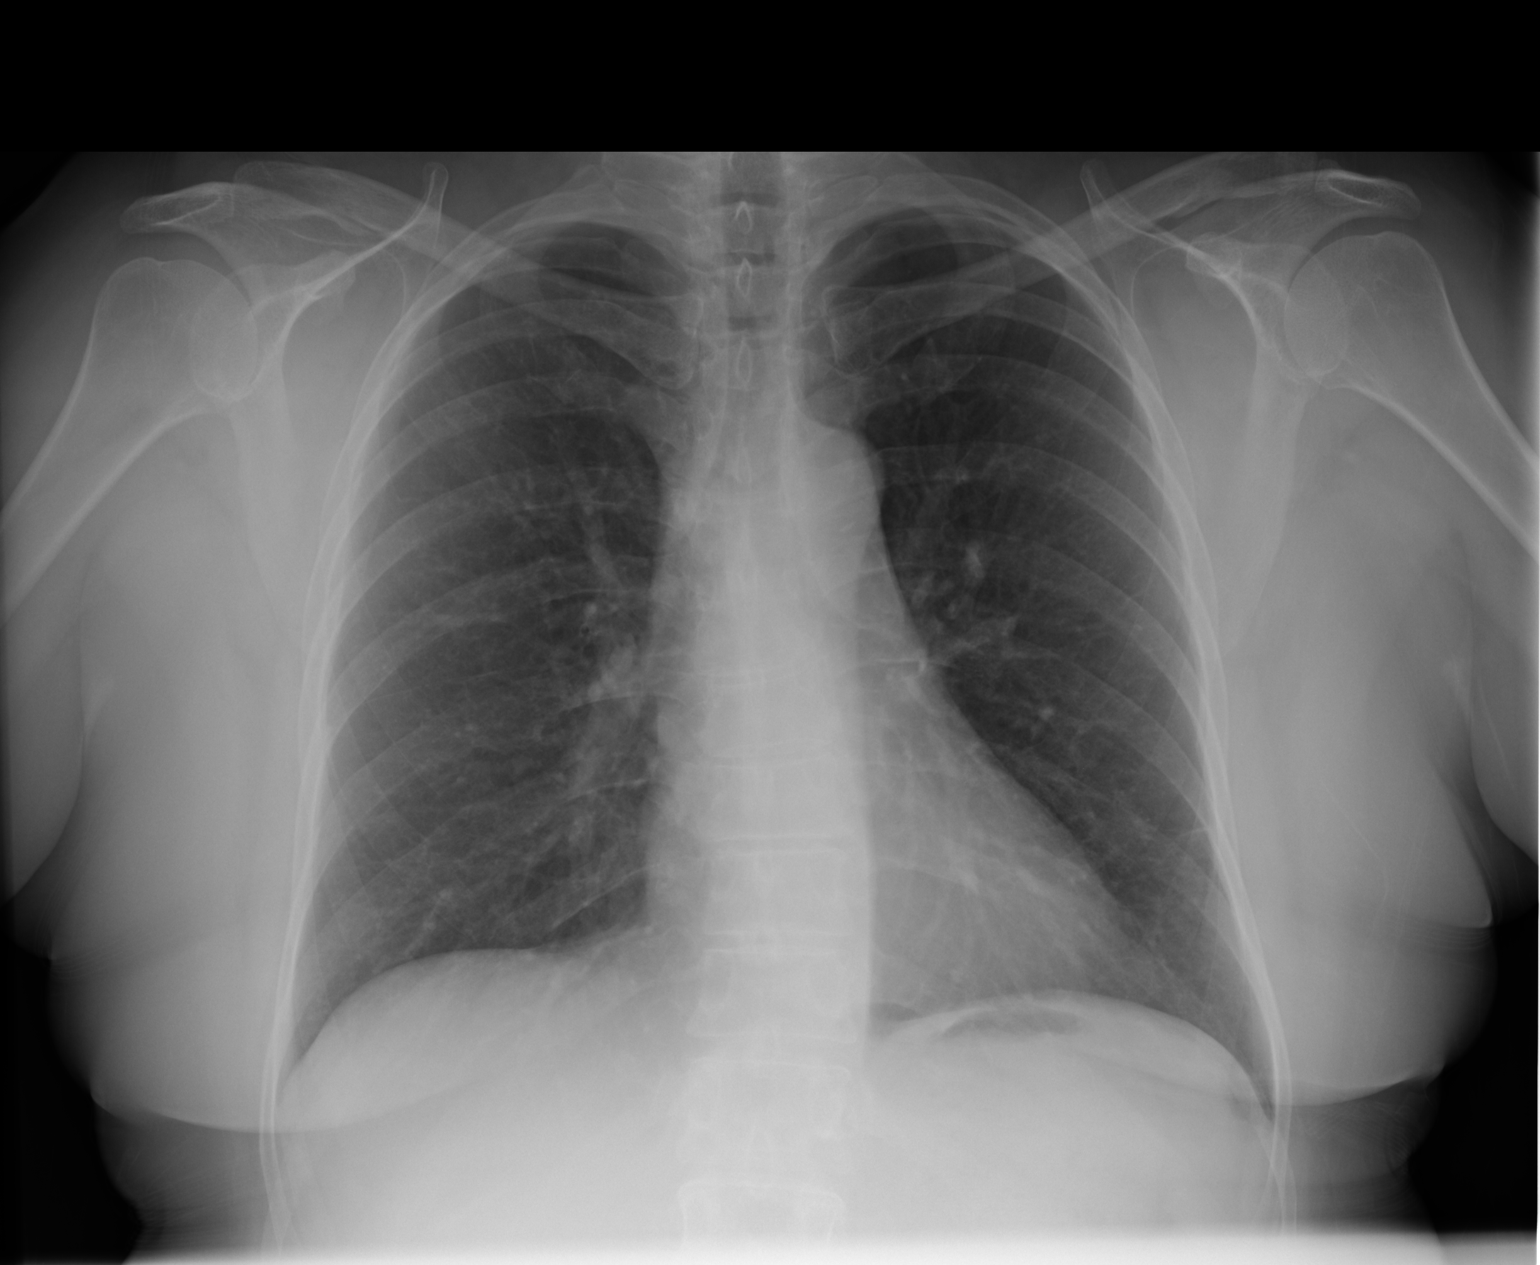
[im 2/2]
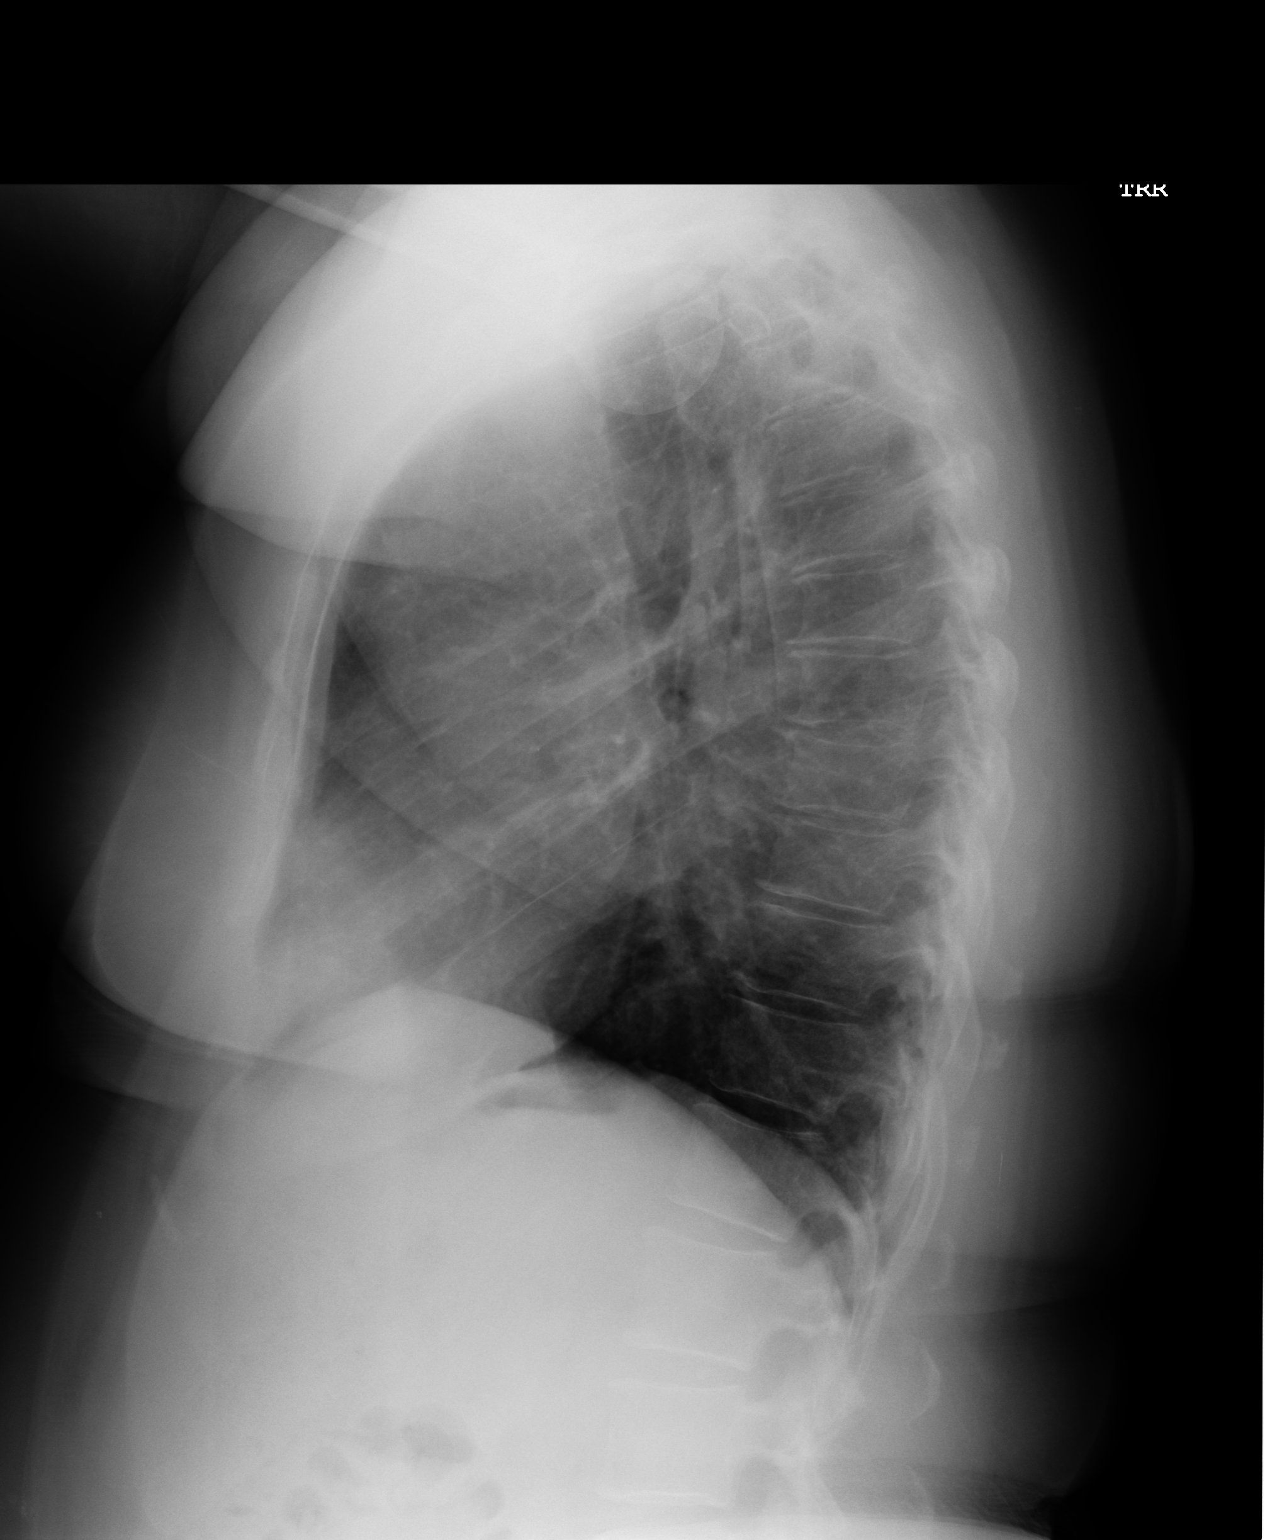

[2 of 2 positions shown; findings below may reference images not displayed]

IMPRESSION: No acute changes are identified.

## 2010-10-19 ENCOUNTER — Emergency Department (HOSPITAL_COMMUNITY): Payer: Self-pay

## 2010-10-19 ENCOUNTER — Emergency Department (HOSPITAL_COMMUNITY)
Admission: EM | Admit: 2010-10-19 | Discharge: 2010-10-19 | Disposition: A | Discharge status: Home / Self Care | Payer: Self-pay | Attending: Emergency Medicine | Admitting: Emergency Medicine

## 2010-10-19 DIAGNOSIS — M549 Dorsalgia, unspecified: Secondary | ICD-10-CM | POA: Insufficient documentation

## 2010-10-19 DIAGNOSIS — B1920 Unspecified viral hepatitis C without hepatic coma: Secondary | ICD-10-CM | POA: Insufficient documentation

## 2010-10-19 DIAGNOSIS — I10 Essential (primary) hypertension: Secondary | ICD-10-CM | POA: Insufficient documentation

## 2010-10-19 DIAGNOSIS — F3289 Other specified depressive episodes: Secondary | ICD-10-CM | POA: Insufficient documentation

## 2010-10-19 DIAGNOSIS — Z87442 Personal history of urinary calculi: Secondary | ICD-10-CM | POA: Insufficient documentation

## 2010-10-19 DIAGNOSIS — F329 Major depressive disorder, single episode, unspecified: Secondary | ICD-10-CM | POA: Insufficient documentation

## 2010-10-19 DIAGNOSIS — N12 Tubulo-interstitial nephritis, not specified as acute or chronic: Secondary | ICD-10-CM | POA: Insufficient documentation

## 2010-10-19 DIAGNOSIS — R109 Unspecified abdominal pain: Secondary | ICD-10-CM | POA: Insufficient documentation

## 2010-10-19 LAB — URINE MICROSCOPIC-ADD ON

## 2010-10-19 LAB — POCT I-STAT, CHEM 8
BUN: 9 mg/dL (ref 6–23)
Chloride: 105 mEq/L (ref 96–112)
Glucose, Bld: 114 mg/dL — ABNORMAL HIGH (ref 70–99)
HCT: 46 % (ref 36.0–46.0)
Potassium: 3.8 mEq/L (ref 3.5–5.1)

## 2010-10-19 LAB — CBC
MCH: 32.9 pg (ref 26.0–34.0)
Platelets: 173 10*3/uL (ref 150–400)
RDW: 12.7 % (ref 11.5–15.5)
WBC: 9.7 10*3/uL (ref 4.0–10.5)

## 2010-10-19 LAB — URINALYSIS, ROUTINE W REFLEX MICROSCOPIC
Ketones, ur: NEGATIVE mg/dL
Urine Glucose, Fasting: 500 mg/dL — AB
pH: 6.5 (ref 5.0–8.0)

## 2010-10-19 LAB — DIFFERENTIAL
Basophils Absolute: 0 10*3/uL (ref 0.0–0.1)
Basophils Relative: 0 % (ref 0–1)
Eosinophils Relative: 3 % (ref 0–5)
Lymphocytes Relative: 38 % (ref 12–46)
Neutro Abs: 4.9 10*3/uL (ref 1.7–7.7)
Neutrophils Relative %: 50 % (ref 43–77)

## 2010-11-14 ENCOUNTER — Emergency Department: Payer: Self-pay | Admitting: Emergency Medicine

## 2011-01-25 NOTE — Discharge Summary (Signed)
[facility]  Patient:    Stephanie [name], Stephanie [name]                      MRN: [id number] Adm. Date:  [id number] Disc. Date: [id number] Attending:  Donnetta Hutching CC:         Gae Gallop, [facility]   Discharge Summary  REASON FOR ADMISSION:  Patient was a 45 year old Caucasian female with a history of cocaine and alcohol dependence.  She presented for detoxification. She had been unable to stop on her own due to severe side effects and craving. For further information, see psychiatric admission assessment.  PHYSICAL EXAMINATION:  This was done by Vic Ripper, P.A.  Patient has hepatitis C.  She is treated by Dr. Maryruth Bun.  He recently stopped treatment because of her substance abuse.  Patient also has chronic sinus problems from seasonal allergies.  ADMISSION LABORATORIES:  Routine chemistry profile was within normal limits. Hypothyroid profile is within normal limits.  TSH within normal limits. Urinalysis was negative.  The urine drug screen negative, although she admits to recent cocaine and alcohol use.  HOSPITAL COURSE:  Upon admission, patient was continued on home medications including Macrobid 100 mg p.o. b.i.d. x 7 days (UTI).  She was also continued on Ativan 0.5 mg q.4h. p.r.n. anxiety.  She was placed on a low-dose Librium protocol due to her excessive anxiety, even though it did not appear she had been drinking enough alcohol to have serious delirium tremens.   She was placed on Remeron 15 mg at bedtime to resolve insomnia.  On November 30, 2000, she was changed to the high-dose Librium protocol due to continued increased anxiety.  On November 30, 2000, she was begun on Celexa 20 mg in the morning to deal with her anxiety.  On December 01, 2000, she was changed back to the low-dose Librium protocol from high-dose Librium protocol due to her excessive sedation and staying in bed much of the time.  On December 01, 2000,  she complained of significant amount of gas and was begun on simethicone 80 mg p.o. b.i.d.  On December 02, 2000, Celexa was increased to 30 mg in the morning. Patient did well on this medication regimen except for her sedation on the higher dose Librium protocol.  There was improvement in her mood and decrease in anxiety.  She was able to be detoxed, though she continued to have some tremor and anxiety.  She was able to participate appropriately in unit therapeutic groups and activities.  She focused on therapies revolving around her continued recovery.  DISCHARGE MENTAL STATUS EXAMINATION:  Improved, less depressed, less anxious. Affect wide range.  Not suicidal or homicidal.  No self-injurious behavior or aggression.  No psychosis or perceptual disturbance.  Thought processes were logical and goal directed.  Cognitive back to baseline.  Judgment fair, insight fair.  DISCHARGE MEDICATIONS: 1. Ativan 0.5 mg p.o. t.i.d. p.r.n. anxiety. 2. Remeron 15 mg p.o. q.h.s. p.r.n. insomnia. 3. Celexa 30 mg in the morning. 4. Macrobid 100 mg b.i.d. x 3 days, then discontinue.  ACTIVITY LEVEL:  No restrictions.  DIET:  No restrictions.  POST HOSPITAL CARE PLAN:  [facility] April 2 at 9:30 with Gae Gallop. DD:  12/04/00 TD:  12/04/00 Job: [id number]

## 2011-01-25 NOTE — H&P (Signed)
[facility]  Patient:    Stephanie [name], Stephanie [name]                      MRN: [id number] Adm. Date:  [id number] Disc. Date: [id number] Attending:  Donnetta Hutching                   Psychiatric Admission Assessment  PAST PSYCHIATRIC HISTORY:  The patient has been at [facility] x 2, once January 19 through 22, 2002, and once December 2001.  SUBSTANCE ABUSE HISTORY:  The patient admits to using crack cocaine approximately $400 or more daily.  She is also using alcohol daily.  PAST MEDICAL HISTORY:  The patient has a history of hepatitis C.  She has no other acute medical problems.  She does admit to smoking cigarettes one pack per day.  She is on no medications.  Drug allergies include morphine, codeine, and penicillin.  SOCIAL HISTORY:  Family history of alcohol dependence.  The patient lives with her husband.  She has been for one year.  She describes him as very supportive.  She has two children.  She had a recent legal charge of driving without a license.  She has a court date December 16, 2000.  MENTAL STATUS EXAMINATION:  Anxious Caucasian female with poor eye contact. Casually dressed, increased psychomotor activation and fidgety behavior. Speech was soft and slow, no articulation disorder.  Mood was depressed and anxious.  Affect: Constricted.  No suicidal or homicidal ideation, no psychoses or perceptual disturbance.  Thought processes were logical and goal directed.   Thought content revealed no predominant theme.  Cognitive: Alert and oriented x 4.  Short-term and long-term memory were adequate.  General fund of knowledge: Age and education level appropriate.  Decreased attention and concentration.  Judgment: Fair.  Insight: Fair.  ADMISSION DIAGNOSES: Axis I:    1. Major depression, recurrent, severe without psychosis.            2. Cocaine dependence.            3. Alcohol dependence. Axis II:   Deferred. Axis III:  Hepatitis C. Axis IV:    Severe. Axis V:    Current global assessment of functioning 30, highest past year 60.  ASSETS AND STRENGTHS:  The patient is engaging.  She has a help-seeking attitude.  She has a supportive family.  Problems: Severe drug dependence and mood instability.  INITIAL PLAN OF CARE:  Short-term treatment goal: Complete Librium detoxification protocol.  Long-term treatment goal: Improvement in mood.  Will have commitment to an active recovery program on a long-term basis.  Plan: Librium detoxification protocol.  Celexa 20 mg q.d. for depression and anxiety.  The patient will be involved in unit therapeutic groups and activities, to improve coping strategies and address chemical dependence issues.  ESTIMATED LENGTH OF STAY:  Three to five days.  CONDITIONS NECESSARY FOR DISCHARGE:  Detoxification will be successfully completed and she will not be experiencing withdrawal symptoms.  POST HOSPITAL CARE PLAN:  Return home to live with her family.  Follow-up therapy and medication management will be arranged with a local clinic prior to discharge. DD:  12/01/00 TD:  12/01/00 Job: [id number]

## 2011-01-25 NOTE — H&P (Signed)
[facility]  Patient:    Stephanie [name], Stephanie [name]                      MRN: [id number] Adm. Date:  [id number] Disc. Date: [id number] Attending:  Donnetta Hutching                   Psychiatric Admission Assessment  DATE OF ADMISSION:  November 29, 2000  PATIENT IDENTIFICATION:  A 45 year old Caucasian female with a history of cocaine and alcohol dependence.  HISTORY OF PRESENT ILLNESS:  The patient presented for detoxification.  She had been unable to stop on her own.  She had been attempting to discontinue drug use but has had significant tremors and cramps.  She also feels depressed and anxious with multiple neurovegetative symptoms.  She is experiencing insomnia, anhedonia, anergia, difficulty concentrating, feelings of hopelessness and worthlessness.  She denied active suicidal or homicidal ideation.  PAST PSYCHIATRIC HISTORY:  The patient has been at [facility] x 2. DD:  12/01/00 TD:  12/01/00 Job: [id number]

## 2011-04-07 ENCOUNTER — Emergency Department (HOSPITAL_COMMUNITY)
Admission: EM | Admit: 2011-04-07 | Discharge: 2011-04-07 | Disposition: A | Discharge status: Home / Self Care | Payer: Self-pay | Attending: Emergency Medicine | Admitting: Emergency Medicine

## 2011-04-07 DIAGNOSIS — Z87442 Personal history of urinary calculi: Secondary | ICD-10-CM | POA: Insufficient documentation

## 2011-04-07 DIAGNOSIS — I10 Essential (primary) hypertension: Secondary | ICD-10-CM | POA: Insufficient documentation

## 2011-04-07 DIAGNOSIS — M545 Low back pain, unspecified: Secondary | ICD-10-CM | POA: Insufficient documentation

## 2011-04-07 DIAGNOSIS — Z8619 Personal history of other infectious and parasitic diseases: Secondary | ICD-10-CM | POA: Insufficient documentation

## 2011-04-07 DIAGNOSIS — N12 Tubulo-interstitial nephritis, not specified as acute or chronic: Secondary | ICD-10-CM | POA: Insufficient documentation

## 2011-04-07 LAB — CBC
HCT: 46.8 % — ABNORMAL HIGH (ref 36.0–46.0)
MCH: 32 pg (ref 26.0–34.0)
MCHC: 34.2 g/dL (ref 30.0–36.0)
MCV: 93.6 fL (ref 78.0–100.0)
RBC: 5 MIL/uL (ref 3.87–5.11)
WBC: 10.3 10*3/uL (ref 4.0–10.5)

## 2011-04-07 LAB — DIFFERENTIAL
Basophils Absolute: 0 10*3/uL (ref 0.0–0.1)
Basophils Relative: 0 % (ref 0–1)
Lymphs Abs: 4.3 10*3/uL — ABNORMAL HIGH (ref 0.7–4.0)
Monocytes Absolute: 0.9 10*3/uL (ref 0.1–1.0)
Neutro Abs: 4.8 10*3/uL (ref 1.7–7.7)

## 2011-04-07 LAB — BASIC METABOLIC PANEL
CO2: 26 mEq/L (ref 19–32)
Calcium: 10.4 mg/dL (ref 8.4–10.5)
GFR calc Af Amer: >60 mL/min (ref >60)
GFR calc non Af Amer: >60 mL/min (ref >60)

## 2011-04-07 LAB — URINALYSIS, ROUTINE W REFLEX MICROSCOPIC
Bilirubin Urine: NEGATIVE
Hgb urine dipstick: NEGATIVE
Nitrite: POSITIVE — AB
Protein, ur: NEGATIVE mg/dL
pH: 6 (ref 5.0–8.0)

## 2011-04-07 LAB — URINE MICROSCOPIC-ADD ON

## 2011-04-09 LAB — URINE CULTURE
Culture  Setup Time: 201207300033
Culture: NO GROWTH

## 2011-05-11 IMAGING — CR DG FOOT COMPLETE 3+V*L*
3 series · 3 of 3 positions shown · non-contrast
Comparison: None

CLINICAL DATA: Left foot pain.

LEFT FOOT - COMPLETE 3+ VIEW

[t foot ap left]
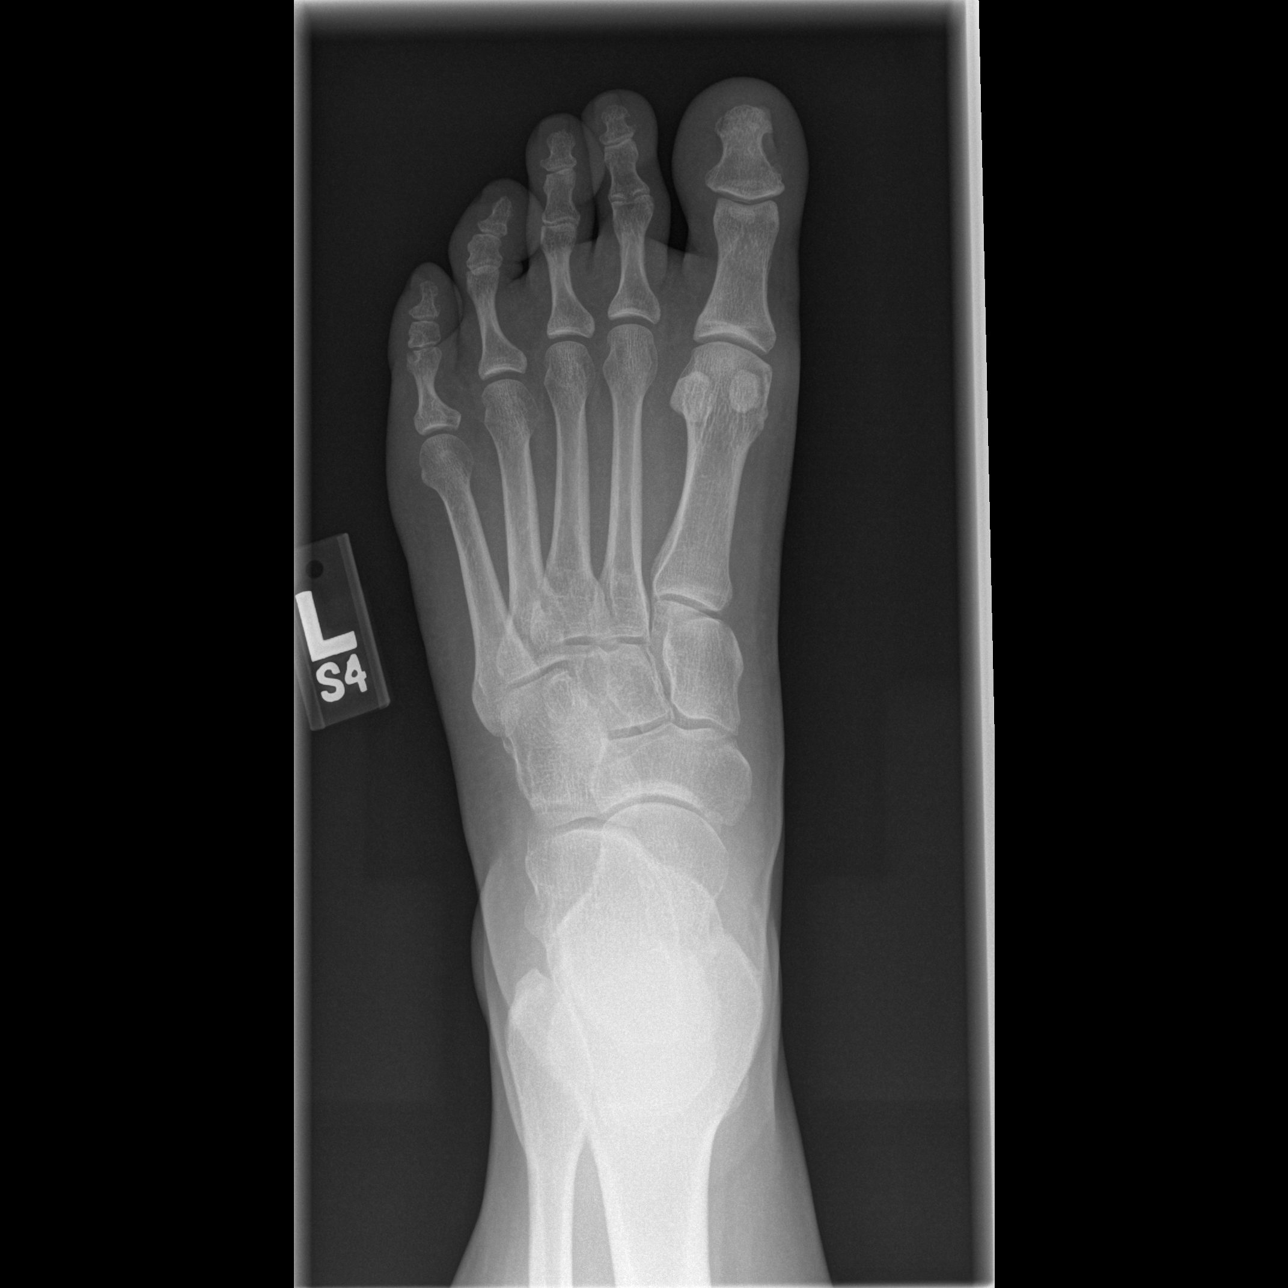

[t foot oblique left]
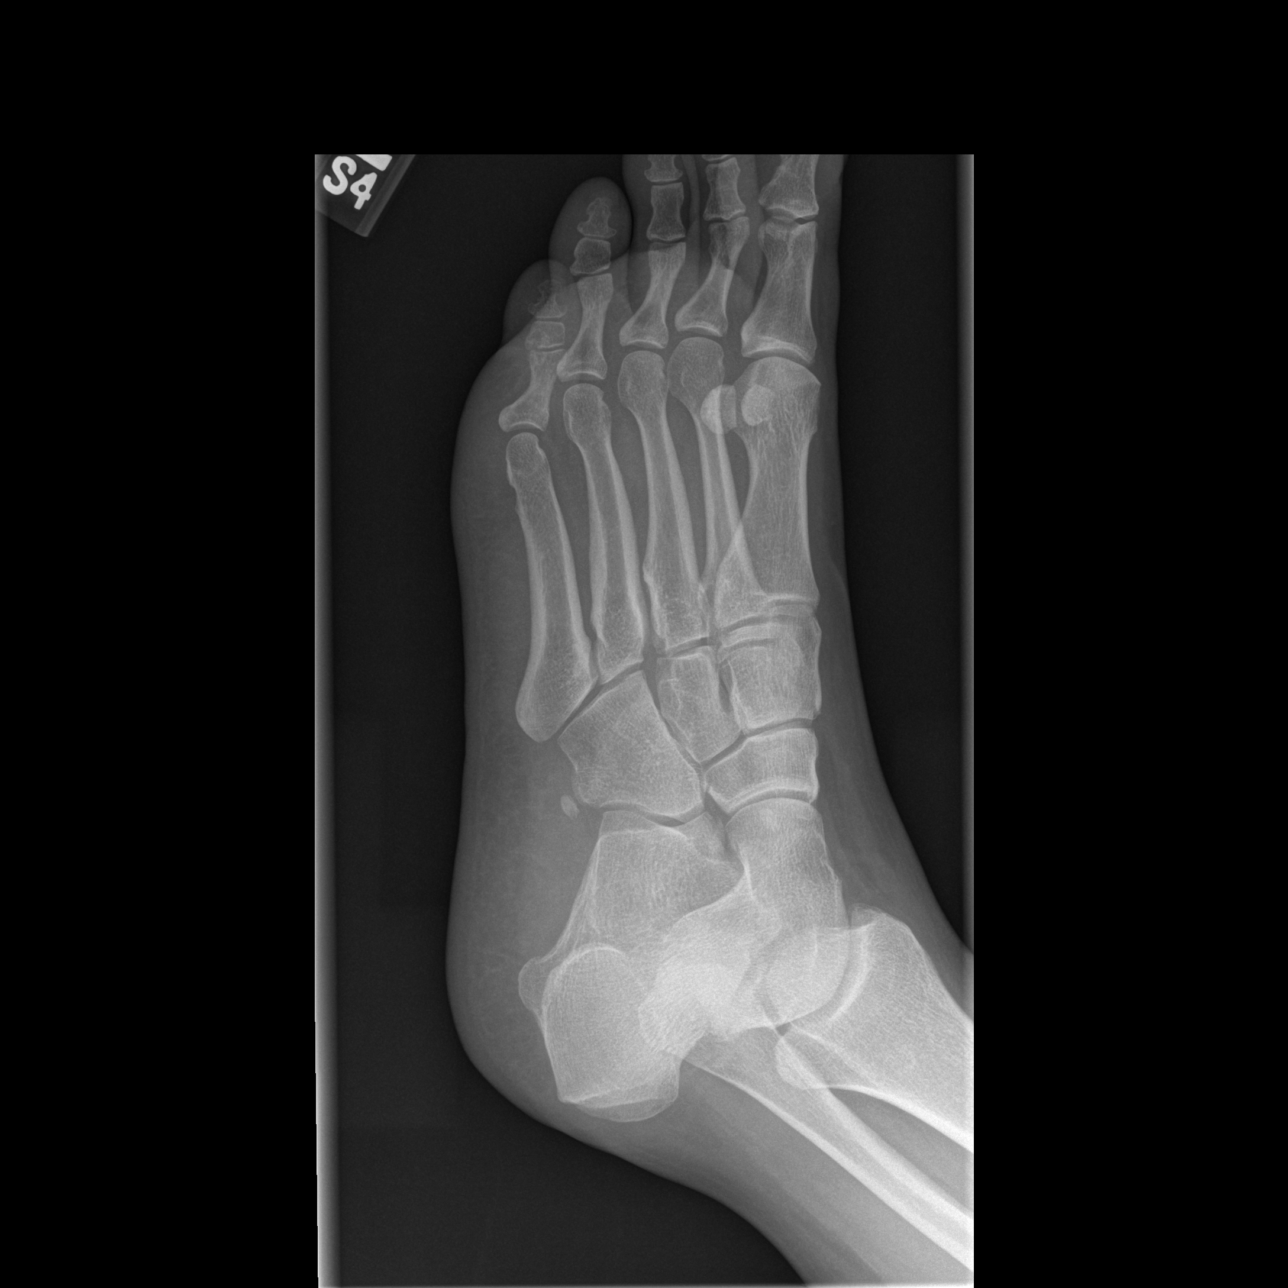

[t foot lat left]
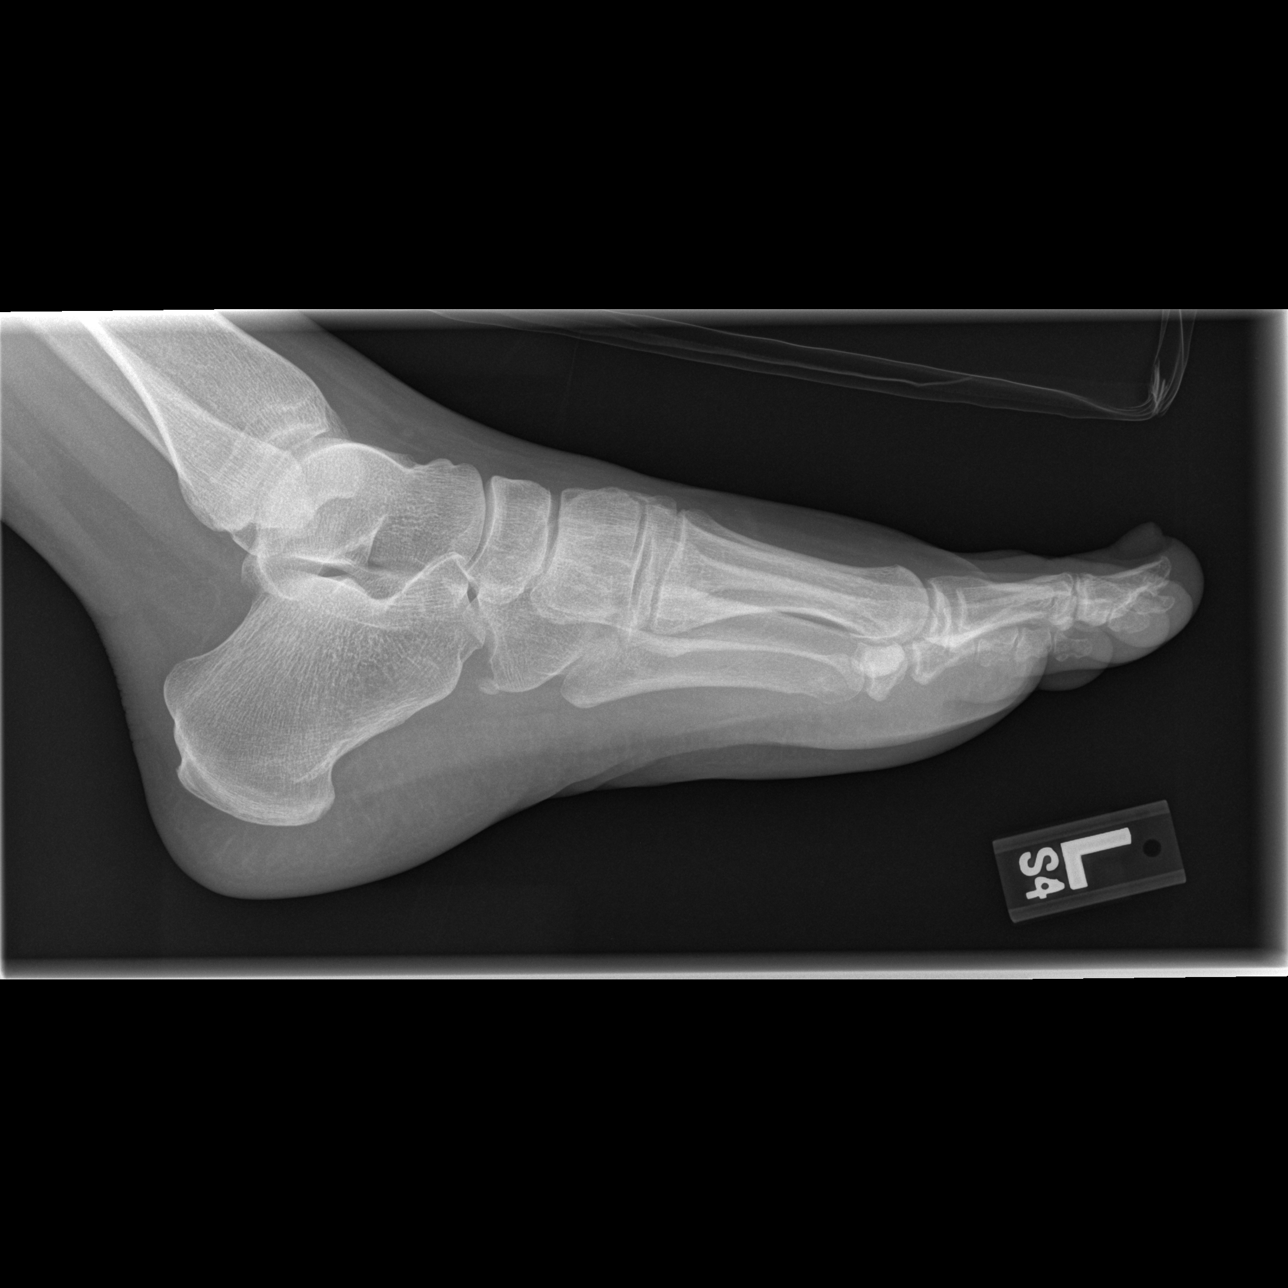

[3 of 3 positions shown; findings below may reference images not displayed]

FINDINGS: Three views of the left foot were obtained.  There is
some irregularity along the lateral aspect of the fifth toe PIP
joint which could be degenerative in etiology.  Otherwise, there
are no acute bony abnormalities.  Normal alignment of the left
foot.
IMPRESSION: No acute bony abnormality to the left foot.

Probable degenerative changes of the fifth toe PIP joint.

## 2011-05-31 LAB — INFLUENZA A+B VIRUS AG-DIRECT(RAPID)
Inflenza A Ag: NEGATIVE
Influenza B Ag: NEGATIVE

## 2011-08-07 ENCOUNTER — Emergency Department: Payer: Self-pay | Admitting: Emergency Medicine

## 2011-08-22 ENCOUNTER — Emergency Department: Payer: Self-pay | Admitting: Emergency Medicine

## 2011-08-28 ENCOUNTER — Emergency Department: Payer: Self-pay | Admitting: Emergency Medicine

## 2011-09-16 ENCOUNTER — Emergency Department: Payer: Self-pay | Admitting: Emergency Medicine

## 2011-09-25 ENCOUNTER — Emergency Department: Payer: Self-pay

## 2011-09-25 LAB — CBC
MCH: 32.2 pg (ref 26.0–34.0)
MCHC: 34.4 g/dL (ref 32.0–36.0)
MCV: 94 fL (ref 80–100)
Platelet: 194 10*3/uL (ref 150–440)
RBC: 4.95 10*6/uL (ref 3.80–5.20)

## 2011-09-25 LAB — COMPREHENSIVE METABOLIC PANEL
Albumin: 4.2 g/dL (ref 3.4–5.0)
Anion Gap: 13 (ref 7–16)
Calcium, Total: 9.8 mg/dL (ref 8.5–10.1)
Chloride: 105 mmol/L (ref 98–107)
Co2: 25 mmol/L (ref 21–32)
EGFR (African American): >60
Glucose: 98 mg/dL (ref 65–99)
Potassium: 4.2 mmol/L (ref 3.5–5.1)
SGOT(AST): 34 U/L (ref 15–37)
SGPT (ALT): 44 U/L
Sodium: 143 mmol/L (ref 136–145)

## 2011-09-25 LAB — URINALYSIS, COMPLETE
Blood: NEGATIVE
Glucose,UR: NEGATIVE mg/dL (ref 0–75)
Ketone: NEGATIVE
Nitrite: NEGATIVE
Ph: 7 (ref 4.5–8.0)
Protein: NEGATIVE
RBC,UR: 5 /HPF (ref 0–5)
Specific Gravity: 1.013 (ref 1.003–1.030)
Squamous Epithelial: 4

## 2011-09-26 ENCOUNTER — Emergency Department: Payer: Self-pay | Admitting: Unknown Physician Specialty

## 2011-09-26 LAB — URINALYSIS, COMPLETE
Bilirubin,UR: NEGATIVE
Blood: NEGATIVE
Glucose,UR: NEGATIVE mg/dL (ref 0–75)
Ketone: NEGATIVE
Nitrite: NEGATIVE
RBC,UR: <1 /HPF (ref 0–5)
Squamous Epithelial: 3

## 2011-09-26 LAB — WET PREP, GENITAL

## 2011-09-27 LAB — URINE CULTURE

## 2011-10-27 ENCOUNTER — Emergency Department: Payer: Self-pay | Admitting: Emergency Medicine

## 2011-12-01 ENCOUNTER — Emergency Department: Payer: Self-pay | Admitting: Internal Medicine

## 2011-12-01 LAB — BASIC METABOLIC PANEL
BUN: 10 mg/dL (ref 7–18)
Calcium, Total: 9.4 mg/dL (ref 8.5–10.1)
Co2: 25 mmol/L (ref 21–32)
EGFR (African American): >60
Glucose: 104 mg/dL — ABNORMAL HIGH (ref 65–99)
Potassium: 4.1 mmol/L (ref 3.5–5.1)
Sodium: 139 mmol/L (ref 136–145)

## 2011-12-01 LAB — CBC
HGB: 16 g/dL (ref 12.0–16.0)
MCV: 94 fL (ref 80–100)
Platelet: 170 10*3/uL (ref 150–440)
RBC: 4.98 10*6/uL (ref 3.80–5.20)
RDW: 13.3 % (ref 11.5–14.5)
WBC: 8.9 10*3/uL (ref 3.6–11.0)

## 2011-12-01 LAB — TROPONIN I: Troponin-I: <0.02 ng/mL

## 2011-12-01 LAB — LIPASE, BLOOD: Lipase: 120 U/L (ref 73–393)

## 2011-12-24 ENCOUNTER — Emergency Department (HOSPITAL_COMMUNITY)
Admission: EM | Admit: 2011-12-24 | Discharge: 2011-12-25 | Disposition: A | Discharge status: Home / Self Care | Payer: Self-pay | Attending: Emergency Medicine | Admitting: Emergency Medicine

## 2011-12-24 ENCOUNTER — Encounter (HOSPITAL_COMMUNITY): Payer: Self-pay

## 2011-12-24 ENCOUNTER — Emergency Department: Payer: Self-pay | Admitting: Emergency Medicine

## 2011-12-24 DIAGNOSIS — N12 Tubulo-interstitial nephritis, not specified as acute or chronic: Secondary | ICD-10-CM | POA: Insufficient documentation

## 2011-12-24 DIAGNOSIS — Z79899 Other long term (current) drug therapy: Secondary | ICD-10-CM | POA: Insufficient documentation

## 2011-12-24 DIAGNOSIS — K5730 Diverticulosis of large intestine without perforation or abscess without bleeding: Secondary | ICD-10-CM | POA: Insufficient documentation

## 2011-12-24 DIAGNOSIS — N200 Calculus of kidney: Secondary | ICD-10-CM | POA: Insufficient documentation

## 2011-12-24 LAB — URINALYSIS, ROUTINE W REFLEX MICROSCOPIC
Bilirubin Urine: NEGATIVE
Ketones, ur: NEGATIVE mg/dL

## 2011-12-24 LAB — URINE MICROSCOPIC-ADD ON

## 2011-12-24 MED ORDER — ONDANSETRON HCL 4 MG/2ML IJ SOLN
4.0000 mg | Freq: Once | INTRAMUSCULAR | Status: AC
  Administered 2011-12-25: 4 mg via INTRAVENOUS
  Filled 2011-12-24: qty 2

## 2011-12-24 MED ORDER — MORPHINE SULFATE 4 MG/ML IJ SOLN
6.0000 mg | Freq: Once | INTRAMUSCULAR | Status: AC
  Administered 2011-12-25: 8 mg via INTRAVENOUS
  Filled 2011-12-24: qty 2

## 2011-12-24 MED ORDER — SODIUM CHLORIDE 0.9 % IV BOLUS (SEPSIS)
1000.0000 mL | Freq: Once | INTRAVENOUS | Status: AC
  Administered 2011-12-25: 1000 mL via INTRAVENOUS

## 2011-12-24 NOTE — ED Notes (Signed)
Pt was dx with kidney stones and a cyst on her kidneys about three weeks ago, has a urologist appt on the 30th

## 2011-12-25 ENCOUNTER — Emergency Department (HOSPITAL_COMMUNITY): Payer: Self-pay

## 2011-12-25 LAB — BASIC METABOLIC PANEL
Calcium: 9.6 mg/dL (ref 8.4–10.5)
GFR calc non Af Amer: >90 mL/min (ref >90)
Sodium: 134 mEq/L — ABNORMAL LOW (ref 135–145)

## 2011-12-25 LAB — CBC
MCH: 32.1 pg (ref 26.0–34.0)
MCHC: 34.3 g/dL (ref 30.0–36.0)
Platelets: 192 10*3/uL (ref 150–400)

## 2011-12-25 MED ORDER — HYDROMORPHONE HCL PF 1 MG/ML IJ SOLN
0.5000 mg | Freq: Once | INTRAMUSCULAR | Status: AC
  Administered 2011-12-25: 0.5 mg via INTRAVENOUS
  Filled 2011-12-25: qty 1

## 2011-12-25 MED ORDER — MORPHINE SULFATE 4 MG/ML IJ SOLN
6.0000 mg | Freq: Once | INTRAMUSCULAR | Status: AC
  Administered 2011-12-25: 8 mg via INTRAVENOUS
  Filled 2011-12-25: qty 2

## 2011-12-25 MED ORDER — OXYCODONE-ACETAMINOPHEN 5-325 MG PO TABS: 1.0000 | ORAL_TABLET | ORAL | Status: AC | PRN

## 2011-12-25 MED ORDER — MORPHINE SULFATE 4 MG/ML IJ SOLN
6.0000 mg | Freq: Once | INTRAMUSCULAR | Status: AC
  Administered 2011-12-25: 6 mg via INTRAVENOUS
  Filled 2011-12-25: qty 2

## 2011-12-25 MED ORDER — CEPHALEXIN 500 MG PO CAPS: 500.0000 mg | ORAL_CAPSULE | Freq: Four times a day (QID) | ORAL | Status: AC

## 2011-12-25 MED ORDER — PROMETHAZINE HCL 25 MG PO TABS: 25.0000 mg | ORAL_TABLET | Freq: Four times a day (QID) | ORAL | Status: DC | PRN

## 2011-12-25 MED ORDER — DEXTROSE 5 % IV SOLN
1.0000 g | Freq: Once | INTRAVENOUS | Status: AC
  Administered 2011-12-25: 1 g via INTRAVENOUS
  Filled 2011-12-25: qty 10

## 2011-12-25 NOTE — Discharge Instructions (Signed)
Pyelonephritis, Adult Pyelonephritis is a kidney infection. A kidney infection can happen quickly, or it can last for a long time. HOME CARE   Take your medicine (antibiotics) as told. Finish it even if you start to feel better.   Keep all doctor visits as told.   Drink enough fluids to keep your pee (urine) clear or pale yellow.   Only take medicine as told by your doctor.  GET HELP RIGHT AWAY IF:   You have a fever.   You cannot take your medicine or drink fluids as told.   You have chills and shaking.   You feel very weak or pass out (faint).   You do not feel better after 2 days.  MAKE SURE YOU:  Understand these instructions.   Will watch your condition.   Will get help right away if you are not doing well or get worse.  Document Released: 10/03/2004 Document Revised: 08/15/2011 Document Reviewed: 02/13/2011 Exit[employer] Patient Information 2012 [employer]. 

## 2011-12-25 NOTE — ED Notes (Signed)
Pt reports she is not getting enough pain relief. MD informed.

## 2011-12-25 NOTE — ED Provider Notes (Signed)
History     CSN: [id number]  Arrival date & time 12/24/11  2023   First MD Initiated Contact with Patient 12/24/11 2325      Chief Complaint  Patient presents with  . Flank Pain    (Consider location/radiation/quality/duration/timing/severity/associated sxs/prior treatment) The history is provided by the patient.   the patient reports 3 days of worsening left-sided flank pain.  She reports his pain radiates down into her left groin and is now become constant.  She reports the radiating pain is improving over the last several days.  She does report a history of kidney stones as well as a history of a cyst on her kidney as well.  She has followup with the urologist scheduled for the 30th of this month however her pain became more severe this evening and now she presents to the ER for evaluation.  She's had urinary frequency without dysuria.  She denies hematuria.  She reports nausea and vomiting.  She denies fevers or chills.  Nothing worsens her symptoms.  Nothing improves her symptoms.  Her symptoms are constant.  Her pain is moderate to severe at this time  History reviewed. No pertinent past medical history.  History reviewed. No pertinent past surgical history.  History reviewed. No pertinent family history.  History  Substance Use Topics  . Smoking status: Not on file  . Smokeless tobacco: Not on file  . Alcohol Use: No    OB History    Grav Para Term Preterm Abortions TAB SAB Ect Mult Living                  Review of Systems  Genitourinary: Positive for flank pain.  All other systems reviewed and are negative.    Allergies  Codeine; Compazine; Penicillins; Tegretol; Toradol; and Tramadol  Home Medications   Current Outpatient Rx  Name Route Sig Dispense Refill  . ALPRAZOLAM 0.5 MG PO TABS Oral Take 0.5 mg by mouth at bedtime as needed. For anxiety    . LOSARTAN POTASSIUM 25 MG PO TABS Oral Take 25 mg by mouth daily.    . ADULT MULTIVITAMIN W/MINERALS CH Oral  Take 1 tablet by mouth daily.    . SERTRALINE HCL 25 MG PO TABS Oral Take 25 mg by mouth daily.    Marland Kitchen ZOLPIDEM TARTRATE 5 MG PO TABS Oral Take 5 mg by mouth at bedtime as needed. For insomnia    . CEPHALEXIN 500 MG PO CAPS Oral Take 1 capsule (500 mg total) by mouth 4 (four) times daily. 28 capsule 0  . OXYCODONE-ACETAMINOPHEN 5-325 MG PO TABS Oral Take 1 tablet by mouth every 4 (four) hours as needed for pain. 15 tablet 0  . PROMETHAZINE HCL 25 MG PO TABS Oral Take 1 tablet (25 mg total) by mouth every 6 (six) hours as needed for nausea. 12 tablet 0    BP 111/68  Pulse 65  Temp(Src) 98.3 F (36.8 C) (Oral)  Resp 18  SpO2 98%  Physical Exam  Nursing note and vitals reviewed. Constitutional: She is oriented to person, place, and time. She appears well-developed and well-nourished. No distress.  HENT:  Head: Normocephalic and atraumatic.  Eyes: EOM are normal.  Neck: Normal range of motion.  Cardiovascular: Normal rate, regular rhythm and normal heart sounds.   Pulmonary/Chest: Effort normal and breath sounds normal.  Abdominal: Soft. She exhibits no distension. There is no tenderness.  Genitourinary:       Mild left CVA tenderness  Musculoskeletal: Normal range  of motion.  Neurological: She is alert and oriented to person, place, and time.  Skin: Skin is warm and dry.  Psychiatric: She has a normal mood and affect. Judgment normal.    ED Course  Procedures (including critical care time)  Labs Reviewed  URINALYSIS, ROUTINE W REFLEX MICROSCOPIC - Abnormal; Notable for the following:    APPearance TURBID (*)    Hgb urine dipstick SMALL (*)    Nitrite POSITIVE (*)    Leukocytes, UA LARGE (*)    All other components within normal limits  CBC - Abnormal; Notable for the following:    WBC 10.6 (*)    Hemoglobin 16.0 (*)    HCT 46.7 (*)    All other components within normal limits  BASIC METABOLIC PANEL - Abnormal; Notable for the following:    Sodium 134 (*)    Creatinine,  Ser 0.48 (*)    All other components within normal limits  URINE MICROSCOPIC-ADD ON - Abnormal; Notable for the following:    Squamous Epithelial / LPF MANY (*)    Bacteria, UA MANY (*)    All other components within normal limits  URINE CULTURE   Ct Abdomen Pelvis Wo Contrast  12/25/2011  *RADIOLOGY REPORT*  Clinical Data: Left flank pain with white blood cells in urine and red blood cells in urine.  CT ABDOMEN AND PELVIS WITHOUT CONTRAST  Technique:  Multidetector CT imaging of the abdomen and pelvis was performed following the standard protocol without intravenous contrast.  Comparison: CT abdomen pelvis 10/19/2010  Findings: Mild bibasilar atelectasis.  There are multiple bilateral renal calculi.  There are areas of cortical scarring bilaterally, right greater than left.  The largest stone is in the lower pole of the right kidney measuring 18 x 10 mm.  The largest stone in the left kidney measures 4 mm. There is no hydronephrosis.  Both ureters are normal in caliber. No ureteral stone or periureteric stranding is identified.  No stone is seen within the urinary bladder.  The noncontrast appearance of the liver, gallbladder, spleen, adrenal glands, and pancreas is within normal limits.  The stomach is decompressed.  Small bowel loops are normal in caliber.  The abdominal aorta is normal in caliber.  The appendix is normal.  The colon contains a moderate amount of stool is normal in caliber.  There is moderate diverticulosis of the sigmoid colon.  There are no acute inflammatory changes. Urinary bladder has normal appearance.  There is no ascites or lymphadenopathy.  Negative for free air.  There is degenerative disc disease at L5-S1 with prominent endplate sclerosis.  No acute bony abnormality.  IMPRESSION:  1.  Bilateral nonobstructing nephrolithiasis, great right greater than left with chronic bilateral renal scarring. 2.  No CT evidence of recently passed stone. 3.  Moderate sigmoid colon  diverticulosis. 4.  Degenerative disc disease at L5 S1.  Original Report Authenticated By: Britta Mccreedy, M.D.   i Personally reviewed the CT scan  1. Pyelonephritis       MDM  The patient appears to have left-sided pyelonephritis.  Her CT scan demonstrates no evidence of ureteral stone.  She feels better at this time.  Discharge home with prescription for pain medicine, nausea medicine, antibiotics.  The patient understands to return to the ER for new or worsening symptoms        Lyanne Co, MD 12/25/11 [phone]

## 2011-12-28 LAB — URINE CULTURE: Colony Count: >=100000

## 2011-12-29 NOTE — ED Notes (Signed)
+  Urine. Patient given Keflex. No sensitivity listed. Chart sent to EDP office for review. °

## 2012-01-07 DIAGNOSIS — Z87442 Personal history of urinary calculi: Secondary | ICD-10-CM | POA: Insufficient documentation

## 2012-01-15 ENCOUNTER — Emergency Department: Payer: Self-pay

## 2012-01-15 LAB — BASIC METABOLIC PANEL
Calcium, Total: 9.4 mg/dL (ref 8.5–10.1)
Chloride: 107 mmol/L (ref 98–107)
Co2: 27 mmol/L (ref 21–32)
Creatinine: 0.65 mg/dL (ref 0.60–1.30)
Glucose: 83 mg/dL (ref 65–99)
Osmolality: 282 (ref 275–301)
Potassium: 4.3 mmol/L (ref 3.5–5.1)
Sodium: 142 mmol/L (ref 136–145)

## 2012-01-15 LAB — URINALYSIS, COMPLETE
Bilirubin,UR: NEGATIVE
Ketone: NEGATIVE
Nitrite: NEGATIVE
Ph: 7 (ref 4.5–8.0)
RBC,UR: 4 /HPF (ref 0–5)
WBC UR: 40 /HPF (ref 0–5)

## 2012-01-15 LAB — CBC
HCT: 46.4 % (ref 35.0–47.0)
MCH: 32 pg (ref 26.0–34.0)
MCHC: 33.4 g/dL (ref 32.0–36.0)
MCV: 96 fL (ref 80–100)
Platelet: 166 10*3/uL (ref 150–440)
RBC: 4.84 10*6/uL (ref 3.80–5.20)
RDW: 13.7 % (ref 11.5–14.5)

## 2012-01-17 LAB — URINE CULTURE

## 2012-01-24 ENCOUNTER — Emergency Department: Payer: Self-pay | Admitting: *Deleted

## 2012-01-24 LAB — URINALYSIS, COMPLETE
Bacteria: NONE SEEN
Bilirubin,UR: NEGATIVE
Glucose,UR: 50 mg/dL (ref 0–75)
Nitrite: NEGATIVE
Ph: 6 (ref 4.5–8.0)
Protein: NEGATIVE
RBC,UR: 4 /HPF (ref 0–5)
Specific Gravity: 1.009 (ref 1.003–1.030)

## 2012-01-26 LAB — URINE CULTURE

## 2012-02-12 HISTORY — PX: KIDNEY STONE SURGERY: SHX686

## 2012-05-22 IMAGING — CR DG SHOULDER 3+V*R*
1 series · 3 of 3 positions shown · non-contrast
Comparison: none

REASON FOR EXAM: pain r shoulder
COMMENTS:

PROCEDURE:     DXR - DXR SHOULDER RIGHT COMPLETE  - April 22, 2010  [DATE]
RESULT:     Three views of the right shoulder reveal the bones to be
adequately mineralized. The glenohumeral joint and the AC joint appear
intact. The observed portions of the clavicle are normal.

[Series 1: view not recorded · 0.17mm/px · 3 of 3 slices shown]
[im 1/3]
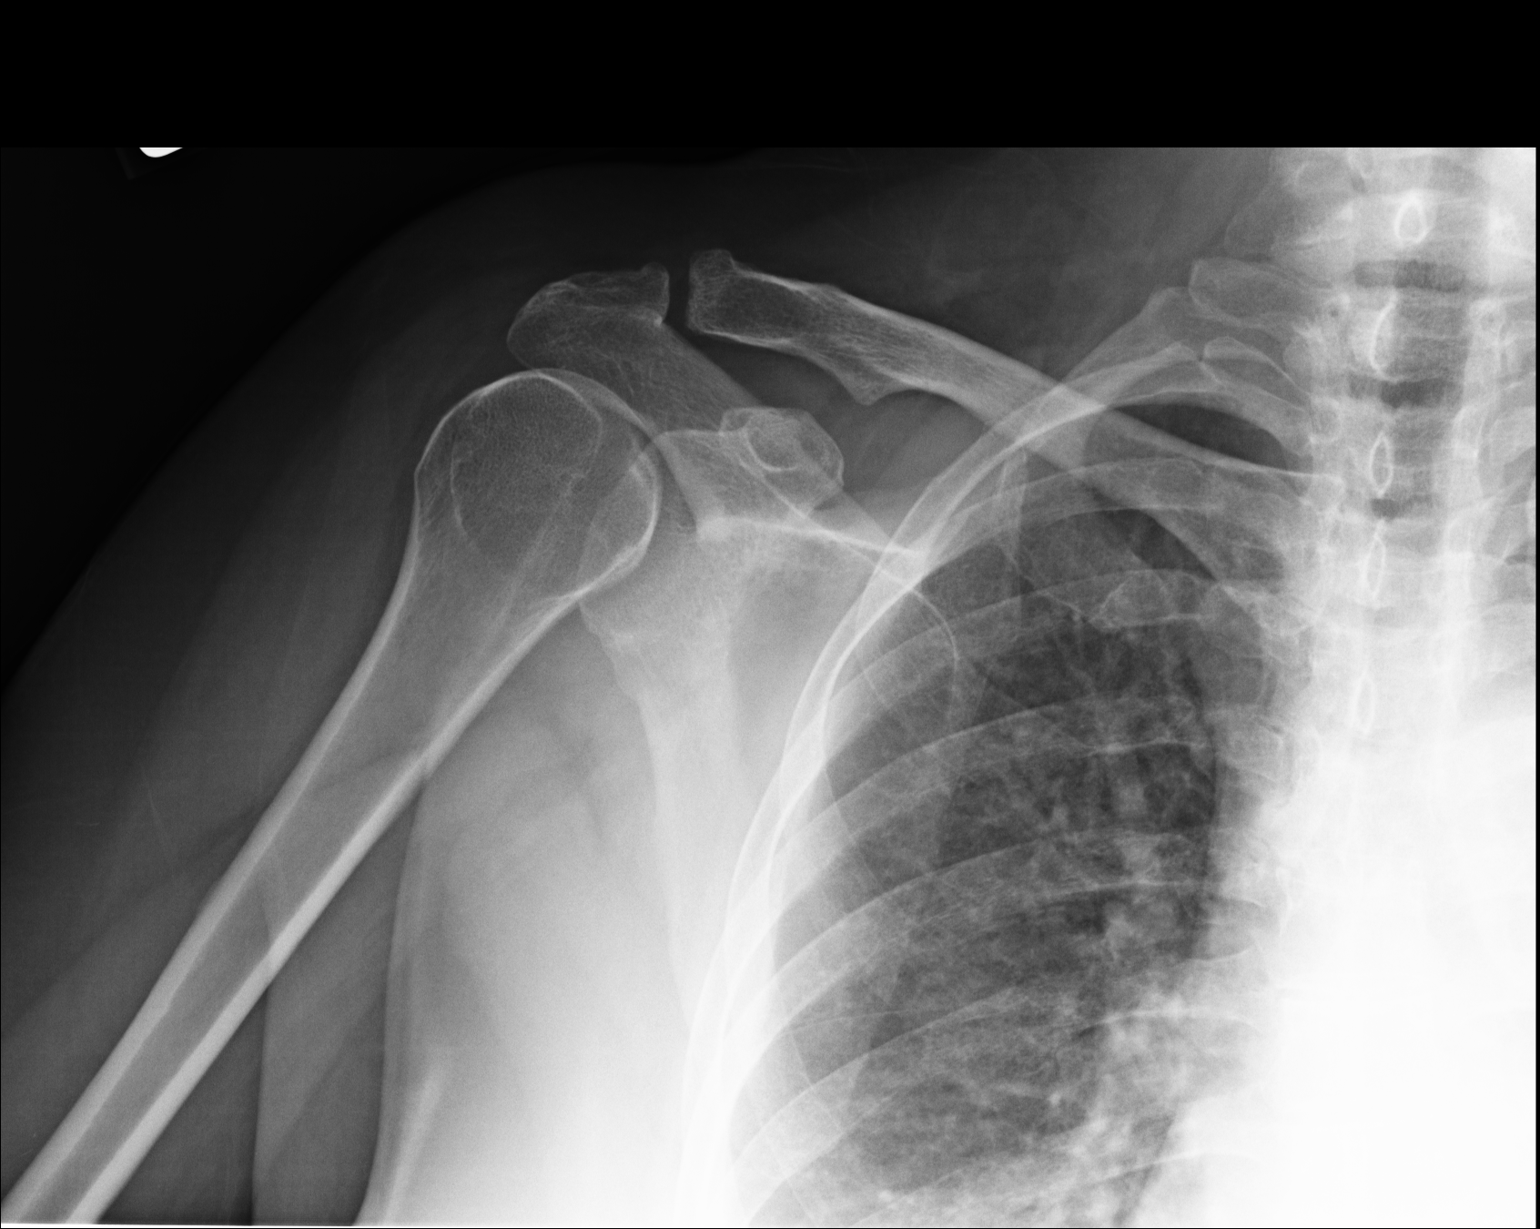
[im 2/3]
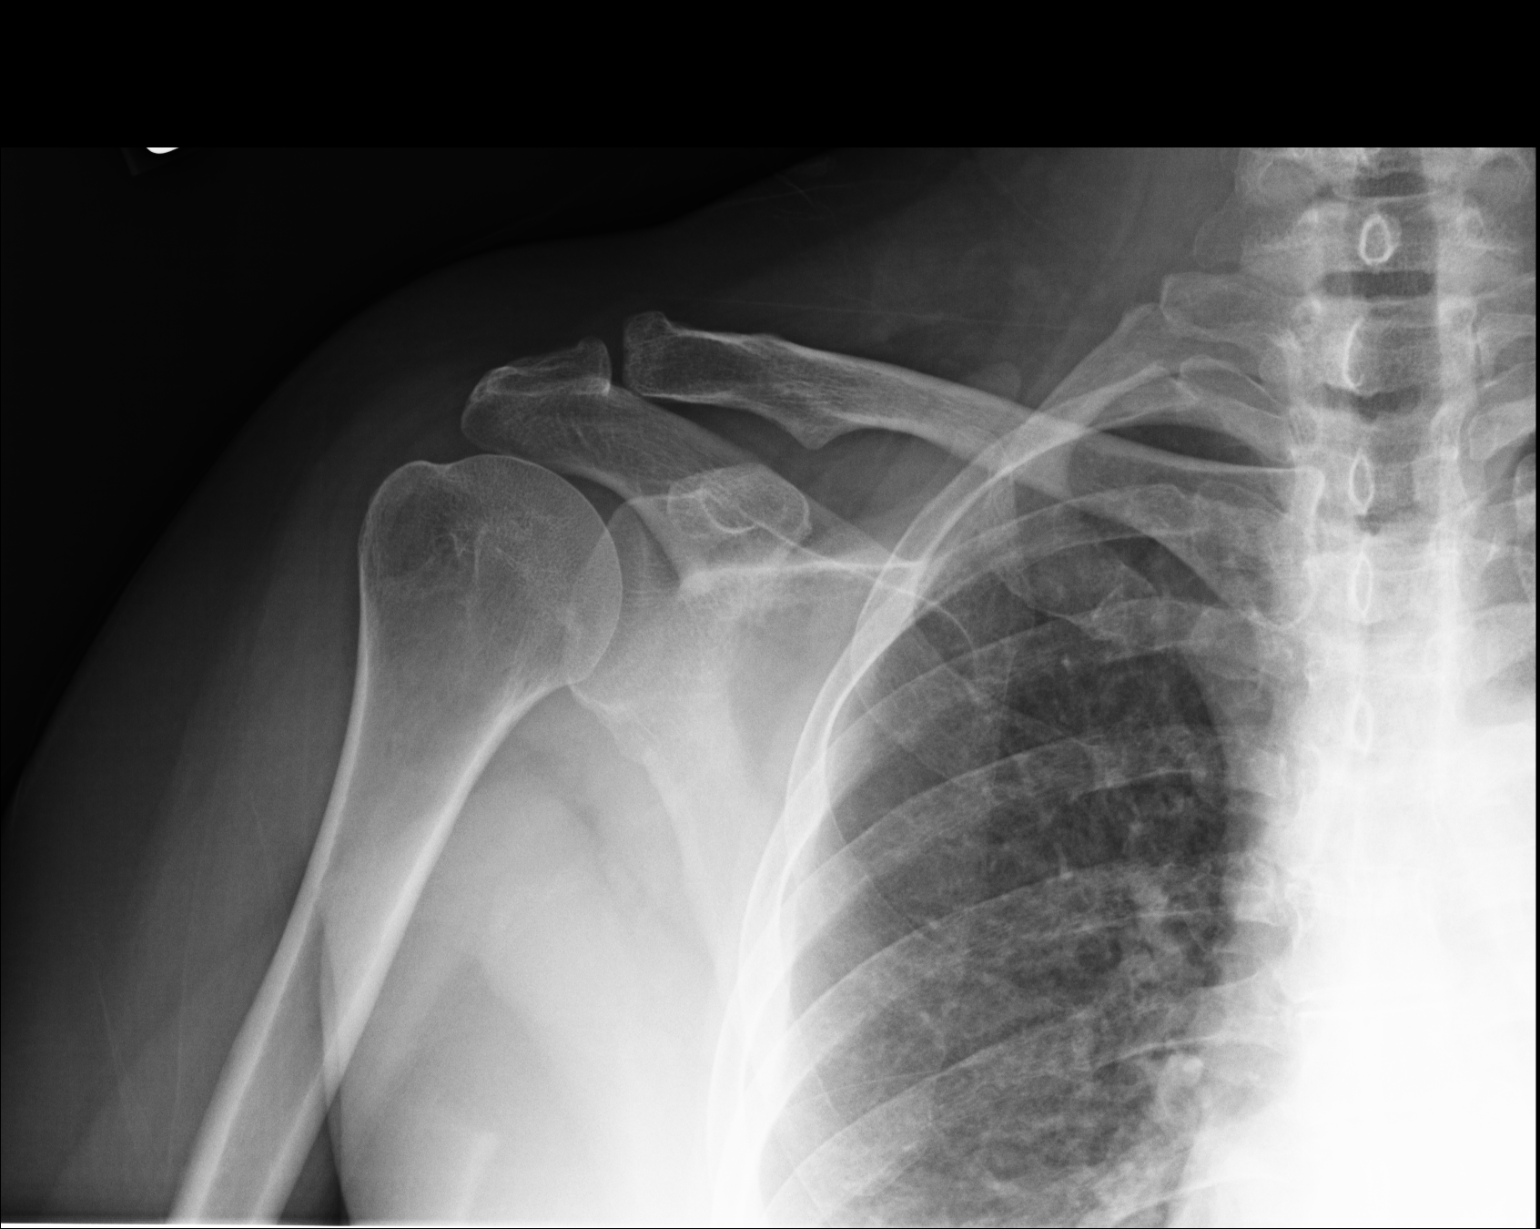
[im 3/3]
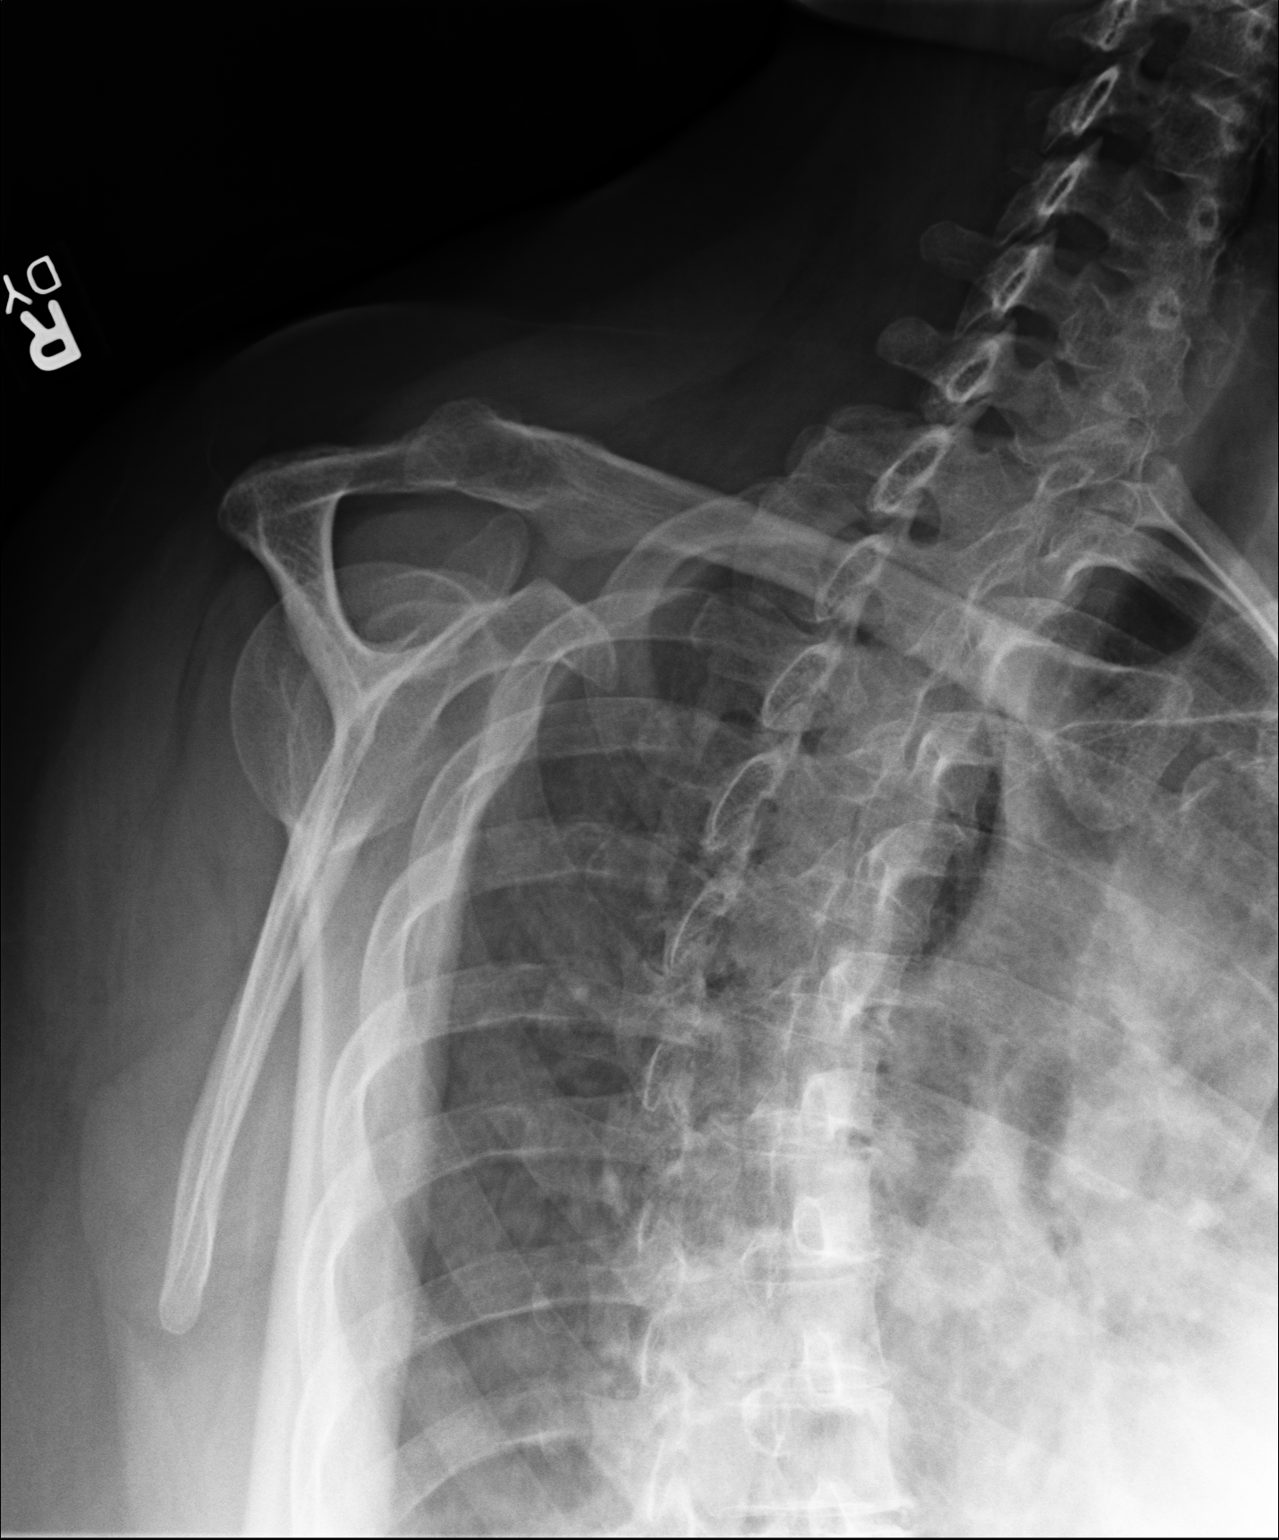

[3 of 3 positions shown; findings below may reference images not displayed]

IMPRESSION: I do not see evidence of acute fracture nor dislocation of
the right shoulder.

## 2012-06-27 ENCOUNTER — Emergency Department (HOSPITAL_COMMUNITY)
Admission: EM | Admit: 2012-06-27 | Discharge: 2012-06-27 | Disposition: A | Discharge status: Home / Self Care | Payer: Self-pay | Attending: Emergency Medicine | Admitting: Emergency Medicine

## 2012-06-27 ENCOUNTER — Encounter (HOSPITAL_COMMUNITY): Payer: Self-pay | Admitting: Emergency Medicine

## 2012-06-27 ENCOUNTER — Emergency Department (HOSPITAL_COMMUNITY): Payer: Self-pay

## 2012-06-27 DIAGNOSIS — B1920 Unspecified viral hepatitis C without hepatic coma: Secondary | ICD-10-CM | POA: Insufficient documentation

## 2012-06-27 DIAGNOSIS — F17200 Nicotine dependence, unspecified, uncomplicated: Secondary | ICD-10-CM | POA: Insufficient documentation

## 2012-06-27 DIAGNOSIS — N200 Calculus of kidney: Secondary | ICD-10-CM | POA: Insufficient documentation

## 2012-06-27 DIAGNOSIS — Z79899 Other long term (current) drug therapy: Secondary | ICD-10-CM | POA: Insufficient documentation

## 2012-06-27 DIAGNOSIS — Z888 Allergy status to other drugs, medicaments and biological substances status: Secondary | ICD-10-CM | POA: Insufficient documentation

## 2012-06-27 DIAGNOSIS — Z8619 Personal history of other infectious and parasitic diseases: Secondary | ICD-10-CM | POA: Insufficient documentation

## 2012-06-27 DIAGNOSIS — I10 Essential (primary) hypertension: Secondary | ICD-10-CM | POA: Insufficient documentation

## 2012-06-27 DIAGNOSIS — N23 Unspecified renal colic: Secondary | ICD-10-CM

## 2012-06-27 HISTORY — DX: Unspecified viral hepatitis C without hepatic coma: B19.20

## 2012-06-27 HISTORY — DX: Essential (primary) hypertension: I10

## 2012-06-27 LAB — URINALYSIS, ROUTINE W REFLEX MICROSCOPIC
Bilirubin Urine: NEGATIVE
Nitrite: NEGATIVE
Protein, ur: NEGATIVE mg/dL
Specific Gravity, Urine: 1.003 — ABNORMAL LOW (ref 1.005–1.030)
Urobilinogen, UA: 0.2 mg/dL (ref 0.0–1.0)

## 2012-06-27 LAB — BASIC METABOLIC PANEL
GFR calc Af Amer: >90 mL/min (ref >90)
GFR calc non Af Amer: >90 mL/min (ref >90)
Potassium: 4 mEq/L (ref 3.5–5.1)
Sodium: 139 mEq/L (ref 135–145)

## 2012-06-27 LAB — CBC WITH DIFFERENTIAL/PLATELET
Basophils Absolute: 0 10*3/uL (ref 0.0–0.1)
Basophils Relative: 0 % (ref 0–1)
Eosinophils Absolute: 0.1 10*3/uL (ref 0.0–0.7)
Lymphs Abs: 3.4 10*3/uL (ref 0.7–4.0)
MCH: 32.4 pg (ref 26.0–34.0)
MCHC: 34.6 g/dL (ref 30.0–36.0)
Neutrophils Relative %: 61 % (ref 43–77)
Platelets: 166 10*3/uL (ref 150–400)
RBC: 4.94 MIL/uL (ref 3.87–5.11)
RDW: 13.5 % (ref 11.5–15.5)

## 2012-06-27 LAB — URINE MICROSCOPIC-ADD ON

## 2012-06-27 MED ORDER — SODIUM CHLORIDE 0.9 % IV BOLUS (SEPSIS): 1000.0000 mL | Freq: Once | INTRAVENOUS | Status: DC

## 2012-06-27 MED ORDER — METOCLOPRAMIDE HCL 5 MG/ML IJ SOLN: 10.0000 mg | Freq: Once | INTRAMUSCULAR | Status: DC

## 2012-06-27 MED ORDER — PROMETHAZINE HCL 25 MG PO TABS: 25.0000 mg | ORAL_TABLET | Freq: Four times a day (QID) | ORAL | Status: DC | PRN

## 2012-06-27 MED ORDER — PROMETHAZINE HCL 25 MG/ML IJ SOLN
12.5000 mg | Freq: Once | INTRAMUSCULAR | Status: AC
  Administered 2012-06-27: 12.5 mg via INTRAVENOUS
  Filled 2012-06-27: qty 1

## 2012-06-27 MED ORDER — FENTANYL CITRATE 0.05 MG/ML IJ SOLN
100.0000 ug | Freq: Once | INTRAMUSCULAR | Status: AC
  Administered 2012-06-27: 100 ug via INTRAVENOUS
  Filled 2012-06-27: qty 2

## 2012-06-27 MED ORDER — HYDROMORPHONE HCL PF 1 MG/ML IJ SOLN
1.0000 mg | Freq: Once | INTRAMUSCULAR | Status: AC
  Administered 2012-06-27: 1 mg via INTRAVENOUS
  Filled 2012-06-27: qty 1

## 2012-06-27 MED ORDER — HYDROCODONE-ACETAMINOPHEN 5-325 MG PO TABS: 1.0000 | ORAL_TABLET | Freq: Four times a day (QID) | ORAL | Status: DC | PRN

## 2012-06-27 MED ORDER — ONDANSETRON HCL 4 MG/2ML IJ SOLN
4.0000 mg | Freq: Once | INTRAMUSCULAR | Status: AC
  Administered 2012-06-27: 4 mg via INTRAVENOUS
  Filled 2012-06-27: qty 2

## 2012-06-27 NOTE — ED Notes (Signed)
Pt c/o N/V/D x 2 days with left sided flank pain into back; pt denies UTI sx

## 2012-06-27 NOTE — ED Notes (Signed)
Patient transported to Ultrasound 

## 2012-06-27 NOTE — ED Provider Notes (Signed)
History     CSN: [id number]  Arrival date & time 06/27/12  4098   First MD Initiated Contact with Patient 06/27/12 [phone]      Chief Complaint  Patient presents with  . Abdominal Pain    (Consider location/radiation/quality/duration/timing/severity/associated sxs/prior treatment) HPI Patient presents to the emergency department with left back and flank pain for the last 2 days.  Patient, states she's had kidney stone issues in the past and her neurologist feels, like he may have formed surgery, remote kidney stones from her left kidney.  Patient, states, that she's not had any shortness of breath, chest pain, diarrhea, headache, fever, or dysuria.  Patient denies taking any treatment prior to arrival.  Patient, states she's given pain medications for her urologist and has run out.    Past Medical History  Diagnosis Date  . Hypertension   . Hepatitis C     Past Surgical History  Procedure Date  . Abdominal hysterectomy     No family history on file.  History  Substance Use Topics  . Smoking status: Current Every Day Smoker  . Smokeless tobacco: Not on file  . Alcohol Use: No    OB History    Grav Para Term Preterm Abortions TAB SAB Ect Mult Living                  Review of Systems All other systems negative except as documented in the HPI. All pertinent positives and negatives as reviewed in the HPI.  Allergies  Aspirin; Codeine; Compazine; Ivp dye; Ketorolac tromethamine; Penicillins; Tegretol; and Tramadol  Home Medications   Current Outpatient Rx  Name Route Sig Dispense Refill  . ALPRAZOLAM 2 MG PO TABS Oral Take 2 mg by mouth at bedtime as needed. anxiety    . BC HEADACHE PO Oral Take 1 Package by mouth every 6 (six) hours as needed. Headache    . HYDROCHLOROTHIAZIDE 25 MG PO TABS Oral Take 25 mg by mouth daily.    Marland Kitchen LISINOPRIL 10 MG PO TABS Oral Take 10 mg by mouth daily.    Marland Kitchen ZOLPIDEM TARTRATE 5 MG PO TABS Oral Take 5 mg by mouth at bedtime as needed.  For insomnia    . PROMETHAZINE HCL 25 MG PO TABS Oral Take 1 tablet (25 mg total) by mouth every 6 (six) hours as needed for nausea. 12 tablet 0    BP 117/78  Pulse 67  Temp 97.9 F (36.6 C) (Oral)  Resp 22  SpO2 99%  Physical Exam  Nursing note and vitals reviewed. Constitutional: She appears well-developed and well-nourished.  HENT:  Head: Normocephalic and atraumatic.  Mouth/Throat: Oropharynx is clear and moist.  Eyes: Pupils are equal, round, and reactive to light.  Neck: Normal range of motion. Neck supple.  Cardiovascular: Normal rate, regular rhythm and normal heart sounds.   Pulmonary/Chest: Effort normal and breath sounds normal.  Abdominal: Soft. Bowel sounds are normal. She exhibits no distension, no ascites and no mass. There is no hepatosplenomegaly. There is tenderness. There is no rigidity, no rebound and no guarding. No hernia.    Skin: Skin is warm and dry. No rash noted.    ED Course  Procedures (including critical care time)  Labs Reviewed  URINALYSIS, ROUTINE W REFLEX MICROSCOPIC - Abnormal; Notable for the following:    Specific Gravity, Urine 1.003 (*)     Leukocytes, UA SMALL (*)     All other components within normal limits  CBC WITH DIFFERENTIAL - Abnormal;  Notable for the following:    WBC 11.0 (*)     Hemoglobin 16.0 (*)     HCT 46.2 (*)     All other components within normal limits  BASIC METABOLIC PANEL - Abnormal; Notable for the following:    Glucose, Bld 104 (*)     All other components within normal limits  URINE MICROSCOPIC-ADD ON - Abnormal; Notable for the following:    Squamous Epithelial / LPF FEW (*)     All other components within normal limits   US Renal  06/27/2012  *RADIOLOGY REPORT*  Clinical Data:  Abdominal pain  RENAL/URINARY TRACT ULTRASOUND COMPLETE  Comparison:  None.  Findings:  Right Kidney:  Measures 10 cm. The right kidney is lobulated with areas of cortical scarring.  No evidence of mass or hydronephrosis.  Nonobstructing stone within the mid pole of the right kidney measures 6 mm.  Left Kidney:  Measures 11 cm.  Normal in size and parenchymal echogenicity.  No evidence of mass or hydronephrosis.  Stone within the upper pole measures 9 mm.  Within the mid pole there is a 4 mm stone.  A 0.9 x 1.1 x 1.1 cm cyst is noted within the mid pole of the left kidney.  Bladder:  Appears normal for degree of bladder distention.  IMPRESSION:  1.  No evidence for hydronephrosis. 2.  Bilateral renal calculi.   Original Report Authenticated By: Rosealee Albee, M.D.      Patient be asked to follow up with her urologist.  Patient's testing here today do not show any significant abnormalities.  Patient is stable and will be given pain control for home.  Patient is advised return here for any worsening in her condition.    MDM  MDM Reviewed: previous chart, nursing note and vitals Reviewed previous: labs Interpretation: labs and ultrasound            Carlyle Dolly, PA-C 06/27/12 1320

## 2012-06-29 NOTE — ED Provider Notes (Signed)
Medical screening examination/treatment/procedure(s) were performed by non-physician practitioner and as supervising physician I was immediately available for consultation/collaboration.   Suzi Roots, MD 06/29/12 [phone]

## 2012-06-30 ENCOUNTER — Emergency Department: Payer: Self-pay | Admitting: Emergency Medicine

## 2012-06-30 LAB — CBC
HGB: 16.1 g/dL — ABNORMAL HIGH (ref 12.0–16.0)
MCV: 92 fL (ref 80–100)
WBC: 9.4 10*3/uL (ref 3.6–11.0)

## 2012-06-30 LAB — URINALYSIS, COMPLETE
Bacteria: NONE SEEN
Bilirubin,UR: NEGATIVE
Blood: NEGATIVE
Glucose,UR: NEGATIVE mg/dL (ref 0–75)
Ketone: NEGATIVE
Nitrite: NEGATIVE
RBC,UR: <1 /HPF (ref 0–5)
Specific Gravity: 1.002 (ref 1.003–1.030)
Squamous Epithelial: 1
WBC UR: 1 /HPF (ref 0–5)

## 2012-06-30 LAB — BASIC METABOLIC PANEL
Calcium, Total: 9.4 mg/dL (ref 8.5–10.1)
Co2: 29 mmol/L (ref 21–32)
EGFR (African American): >60
Potassium: 4 mmol/L (ref 3.5–5.1)
Sodium: 142 mmol/L (ref 136–145)

## 2012-07-15 HISTORY — PX: KIDNEY STONE SURGERY: SHX686

## 2012-07-30 DIAGNOSIS — Z8541 Personal history of malignant neoplasm of cervix uteri: Secondary | ICD-10-CM | POA: Insufficient documentation

## 2012-08-11 IMAGING — CR DG CHEST 2V
1 series · 2 of 2 positions shown · non-contrast
Comparison: none

REASON FOR EXAM: cough , fever
COMMENTS:

PROCEDURE:     DXR - DXR CHEST PA (OR AP) AND LATERAL  - July 12, 2010 [DATE]
RESULT:     Comparison is made to a prior study dated 08/09/2008.
There is no evidence of focal infiltrates, effusions or edema. The cardiac
silhouette and visualized bony skeleton are unremarkable.

[Series 1: view not recorded · 0.17mm/px · 2 of 2 slices shown]
[im 1/2]
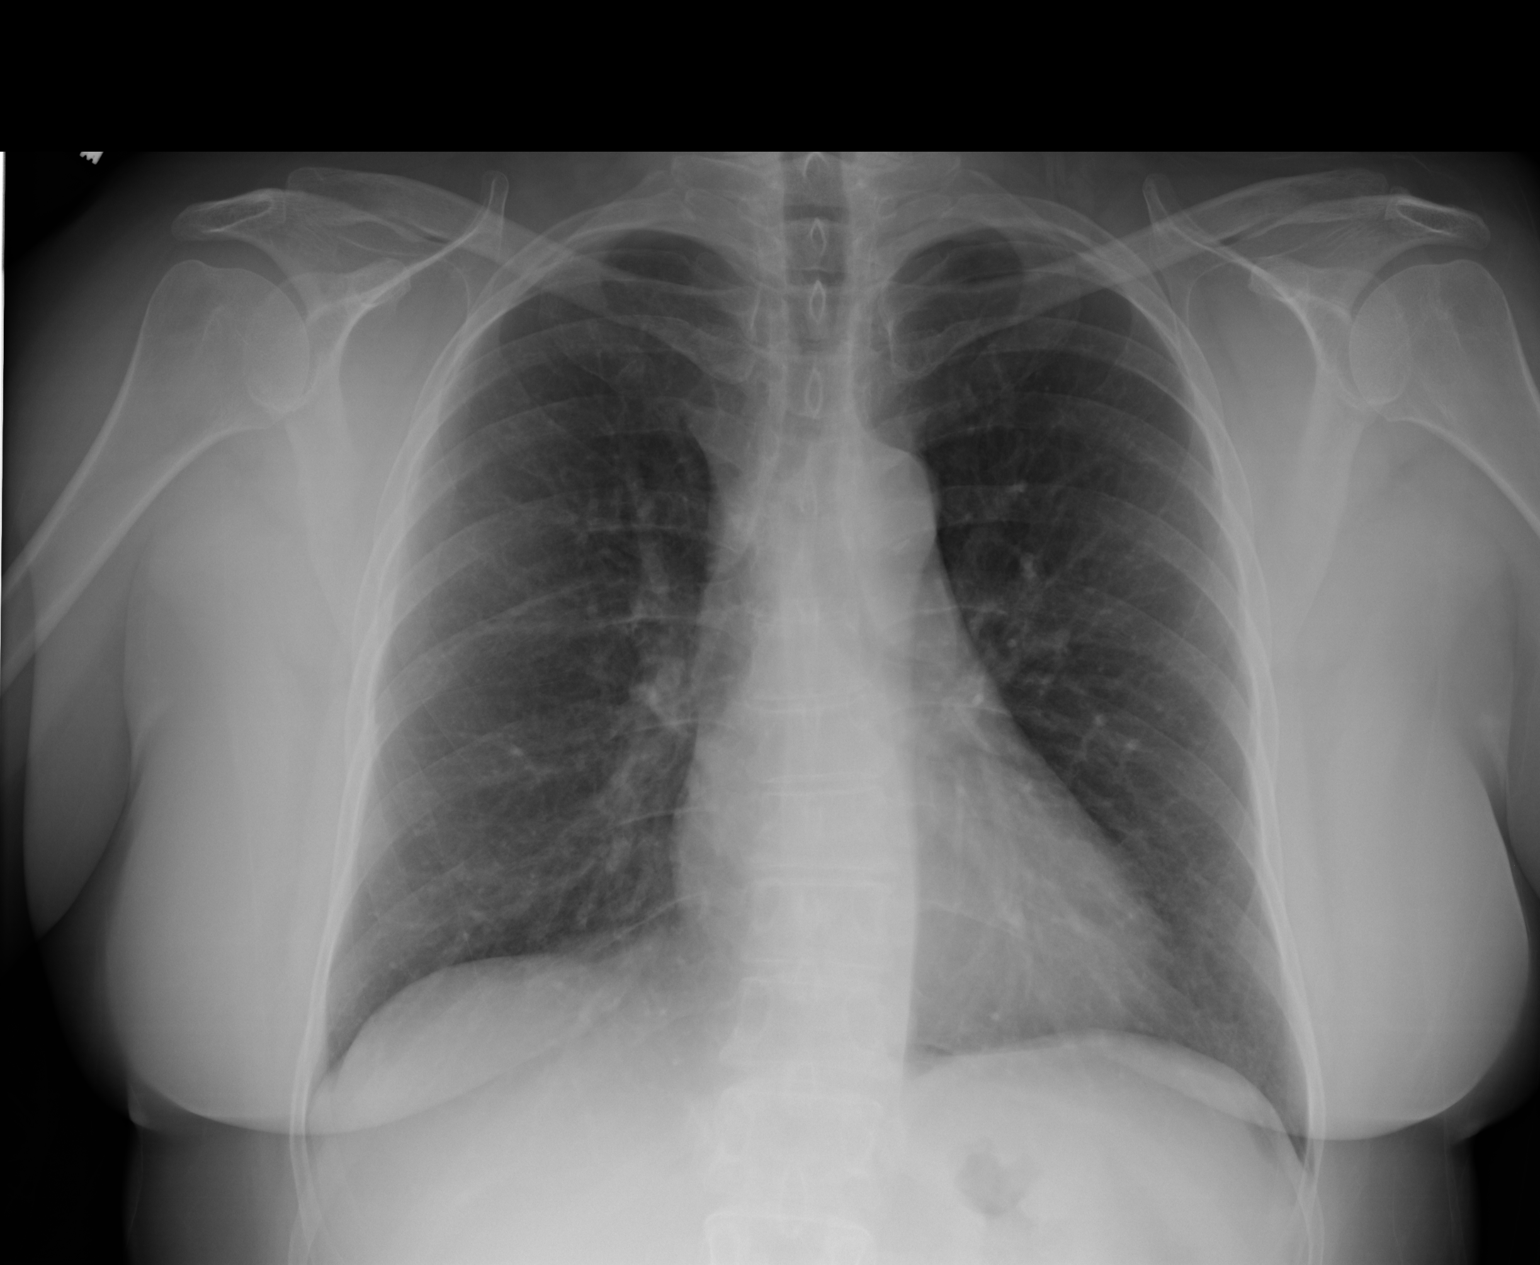
[im 2/2]
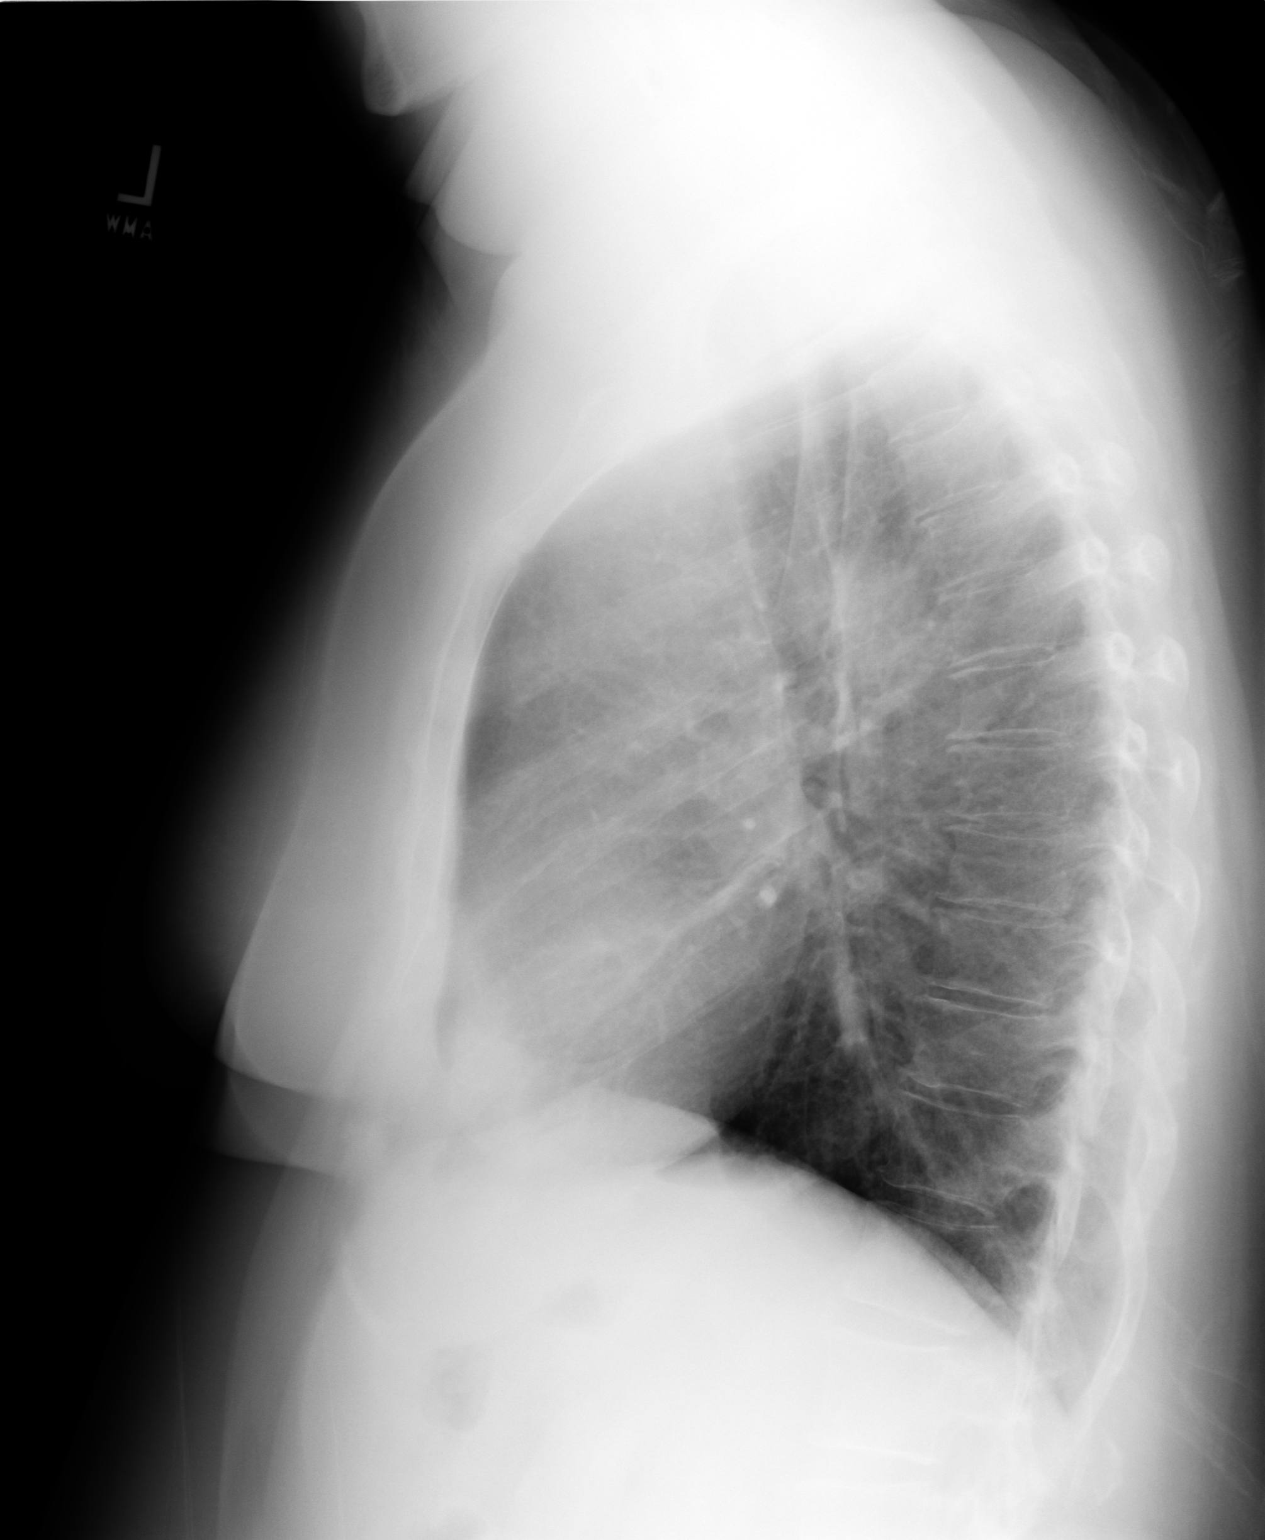

[2 of 2 positions shown; findings below may reference images not displayed]

IMPRESSION: Chest radiograph without evidence of acute cardiopulmonary disease.

## 2012-08-31 IMAGING — CT CT HEAD WITHOUT CONTRAST
2 series · 16 of 30 positions shown, 20 images · non-contrast
Comparison: none

REASON FOR EXAM: severe HA with elevate BP, unresolve with catapress
COMMENTS:   LMP: Post Hysterectomy

PROCEDURE:     CT  - CT HEAD WITHOUT CONTRAST  - August 01, 2010  [DATE]
RESULT:     Comparison:  None
TECHNIQUE: Multiple axial images from the foramen magnum to the vertex were
obtained without IV contrast.

[Series 2: without · axial · non-contrast · 0.41mm/px · z∈[-220,-100]mm · 13 of 30 slices shown, 17 images]
[im 3/30  brain]
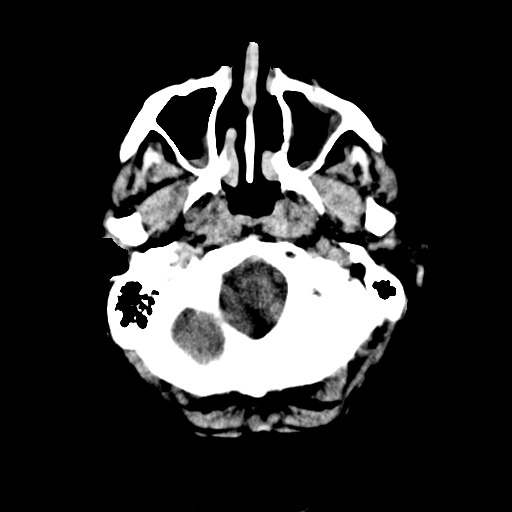
[im 3/30  bone]
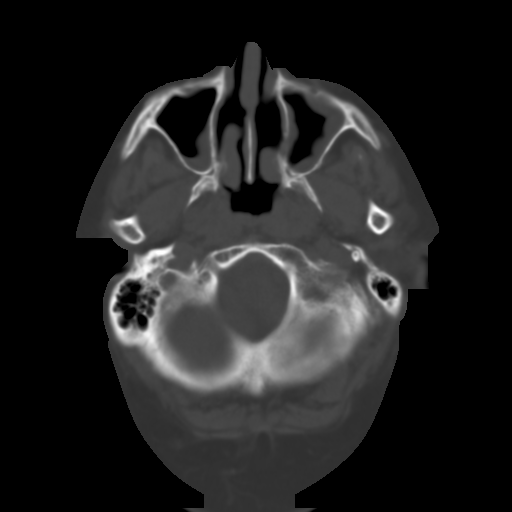
[im 5/30  brain]
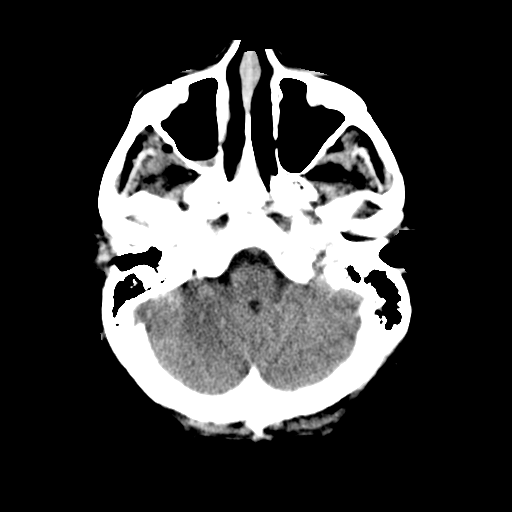
[im 7/30  brain]
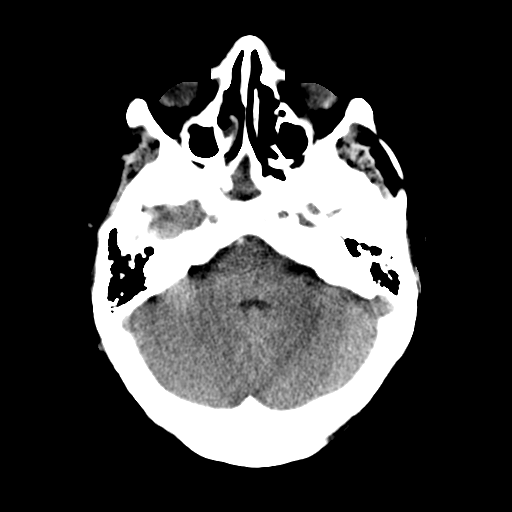
[im 9/30  brain]
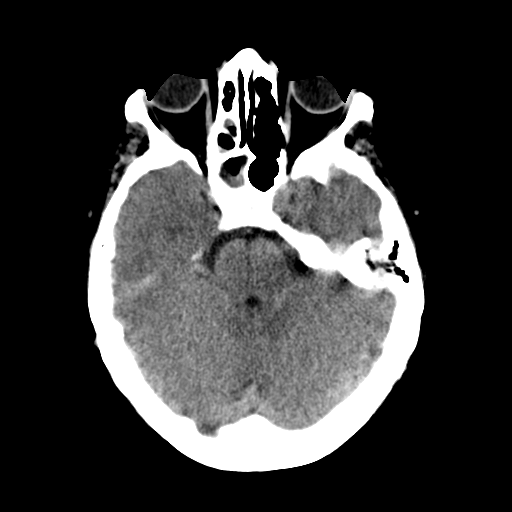
[im 11/30  brain]
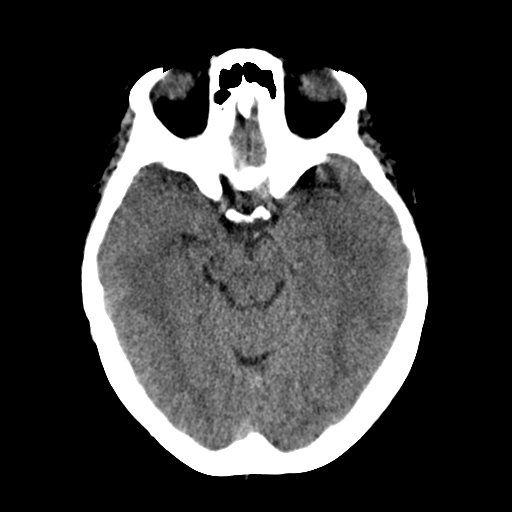
[im 11/30  bone]
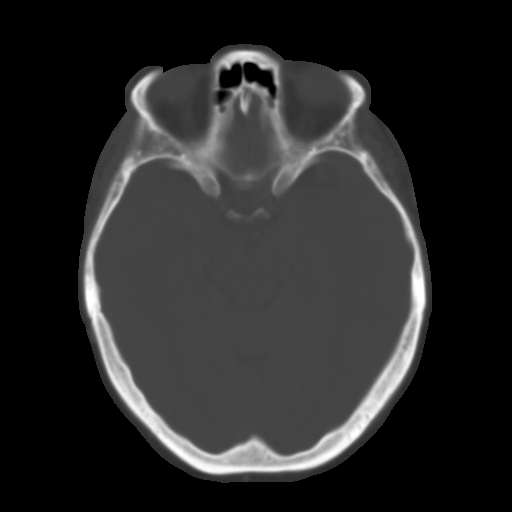
[im 13/30  brain]
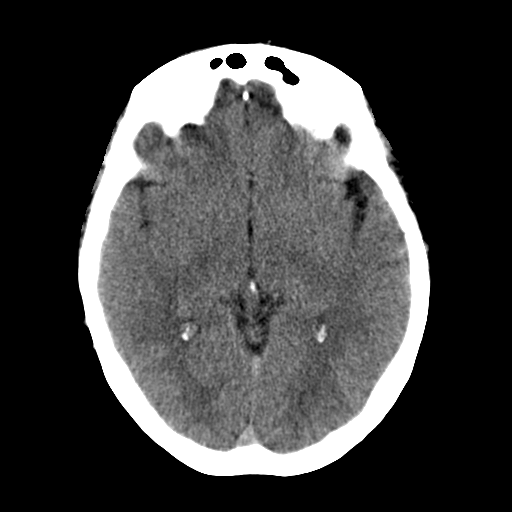
[im 15/30  brain]
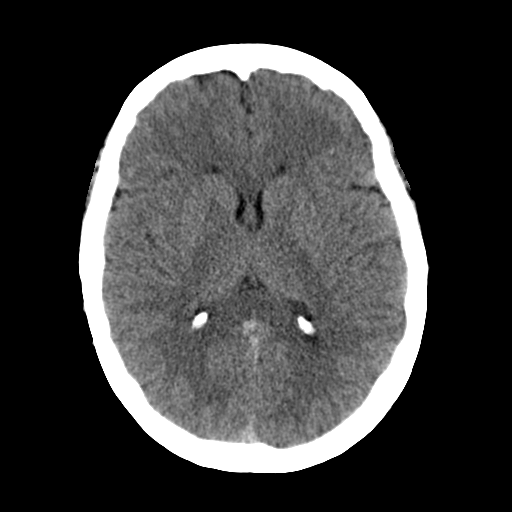
[im 17/30  brain]
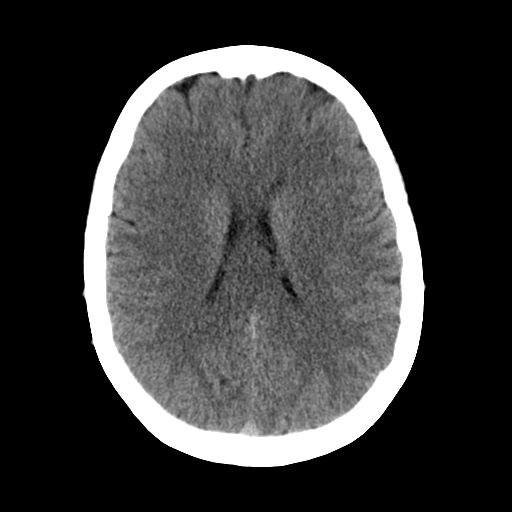
[im 19/30  brain]
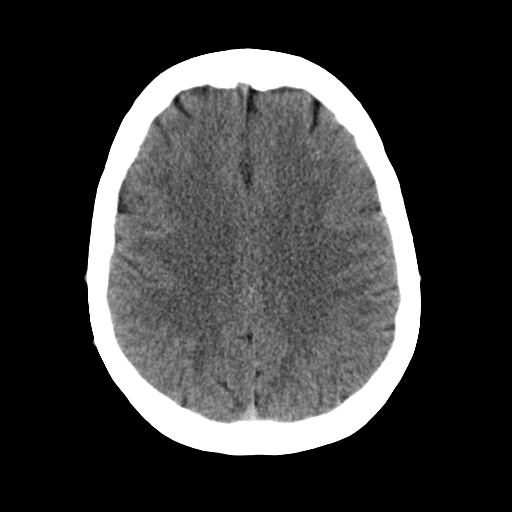
[im 19/30  bone]
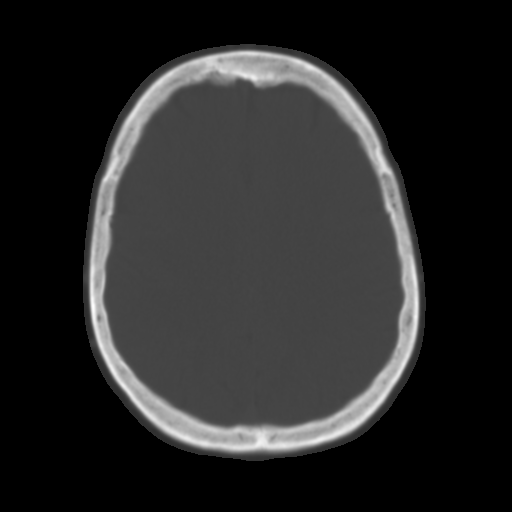
[im 21/30  brain]
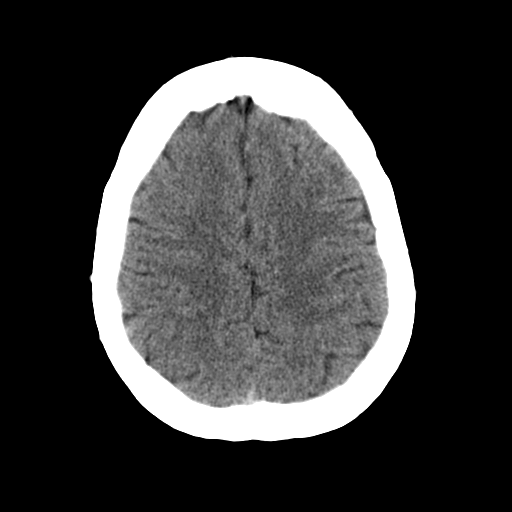
[im 23/30  brain]
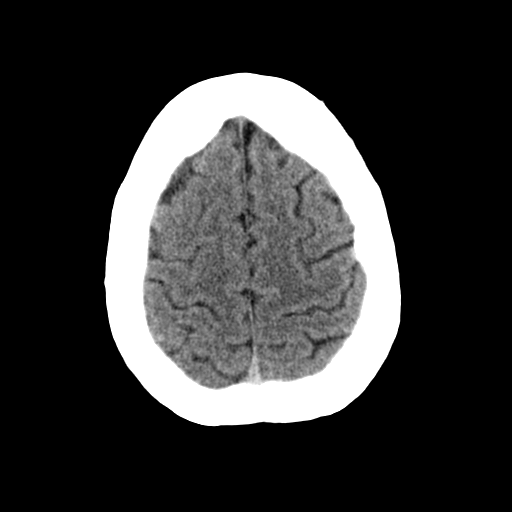
[im 25/30  brain]
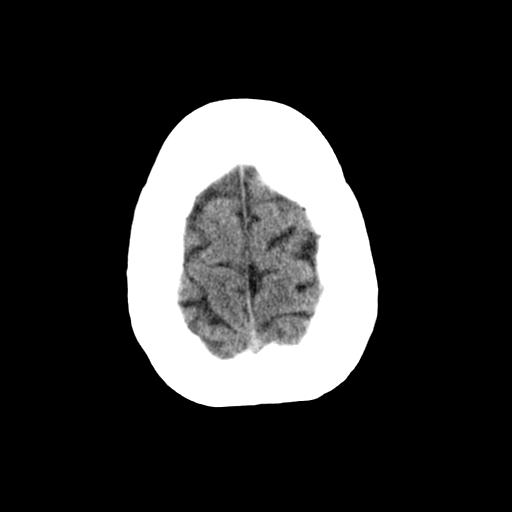
[im 27/30  brain]
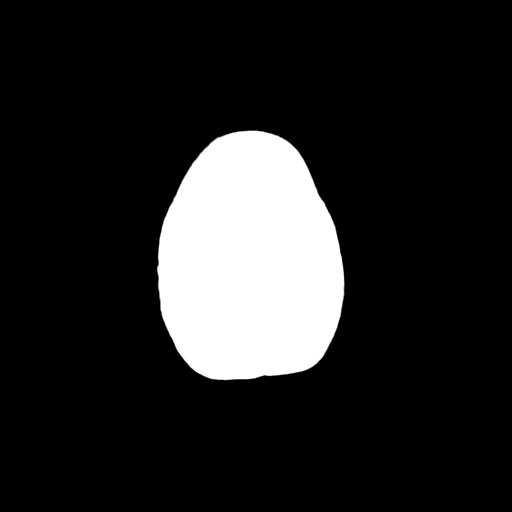
[im 27/30  bone]
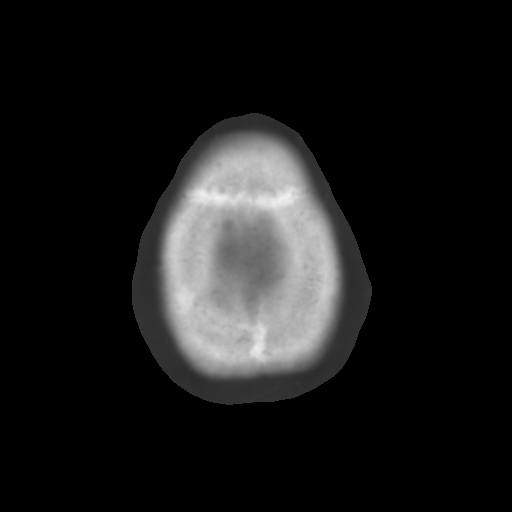

[Series 3: bone · axial · 0.41mm/px · z∈[-220,-180]mm · 3 of 30 slices shown]
[im 3/30  bone]
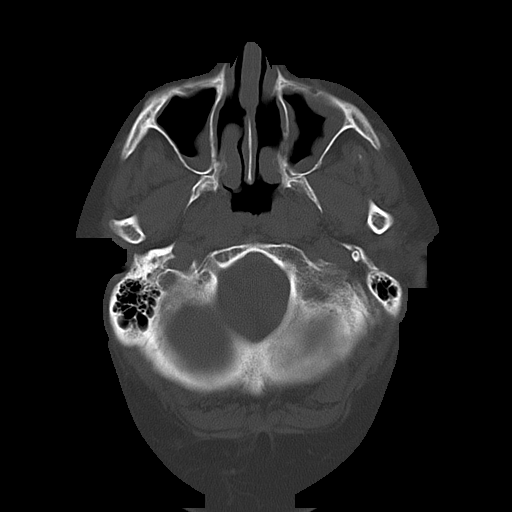
[im 7/30  bone]
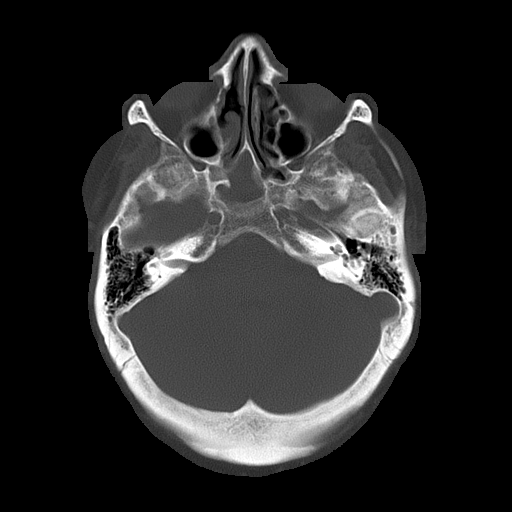
[im 11/30  bone]
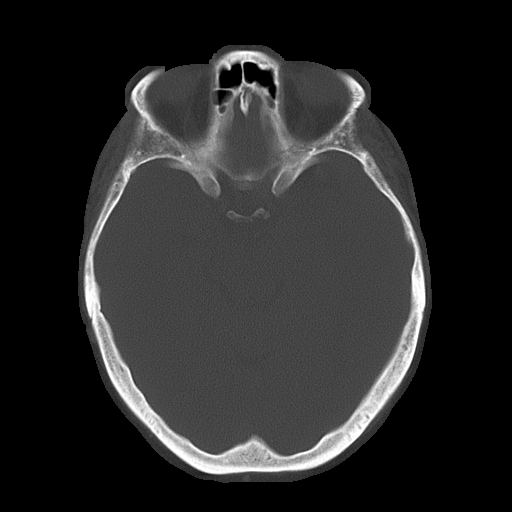

[16 of 30 positions shown; findings below may reference images not displayed]

FINDINGS: There is no evidence for mass effect, midline shift, or extra-axial fluid
collections. There is no evidence for space-occupying lesion, intracranial
hemorrhage, or cortical-based area of infarction.

There is moderate mucosal thickening of the maxillary sinuses, left greater
than right. There is a small air-filled right maxillary sinus. There is
near-complete opacification of the sphenoid sinus. Moderate opacifications
in the right ethmoid air cells.

The osseous structures are unremarkable.
IMPRESSION: 1. No acute intracranial process.
2. Pansinusitis.

## 2012-09-11 DIAGNOSIS — F17200 Nicotine dependence, unspecified, uncomplicated: Secondary | ICD-10-CM | POA: Insufficient documentation

## 2012-09-13 ENCOUNTER — Emergency Department (HOSPITAL_COMMUNITY)
Admission: EM | Admit: 2012-09-13 | Discharge: 2012-09-13 | Disposition: A | Discharge status: Home / Self Care | Payer: Self-pay | Attending: Emergency Medicine | Admitting: Emergency Medicine

## 2012-09-13 ENCOUNTER — Encounter (HOSPITAL_COMMUNITY): Payer: Self-pay | Admitting: Emergency Medicine

## 2012-09-13 DIAGNOSIS — R51 Headache: Secondary | ICD-10-CM

## 2012-09-13 DIAGNOSIS — I10 Essential (primary) hypertension: Secondary | ICD-10-CM | POA: Insufficient documentation

## 2012-09-13 DIAGNOSIS — F17200 Nicotine dependence, unspecified, uncomplicated: Secondary | ICD-10-CM | POA: Insufficient documentation

## 2012-09-13 DIAGNOSIS — M542 Cervicalgia: Secondary | ICD-10-CM | POA: Insufficient documentation

## 2012-09-13 DIAGNOSIS — Z8619 Personal history of other infectious and parasitic diseases: Secondary | ICD-10-CM | POA: Insufficient documentation

## 2012-09-13 DIAGNOSIS — M436 Torticollis: Secondary | ICD-10-CM

## 2012-09-13 DIAGNOSIS — M549 Dorsalgia, unspecified: Secondary | ICD-10-CM | POA: Insufficient documentation

## 2012-09-13 DIAGNOSIS — M2569 Stiffness of other specified joint, not elsewhere classified: Secondary | ICD-10-CM | POA: Insufficient documentation

## 2012-09-13 DIAGNOSIS — Z79899 Other long term (current) drug therapy: Secondary | ICD-10-CM | POA: Insufficient documentation

## 2012-09-13 MED ORDER — SODIUM CHLORIDE 0.9 % IV BOLUS (SEPSIS)
1000.0000 mL | Freq: Once | INTRAVENOUS | Status: AC
  Administered 2012-09-13: 1000 mL via INTRAVENOUS

## 2012-09-13 MED ORDER — DEXAMETHASONE SODIUM PHOSPHATE 10 MG/ML IJ SOLN
10.0000 mg | Freq: Once | INTRAMUSCULAR | Status: AC
  Administered 2012-09-13: 10 mg via INTRAVENOUS
  Filled 2012-09-13: qty 1

## 2012-09-13 MED ORDER — METOCLOPRAMIDE HCL 5 MG/ML IJ SOLN
10.0000 mg | Freq: Once | INTRAMUSCULAR | Status: DC
  Filled 2012-09-13: qty 2

## 2012-09-13 MED ORDER — DIPHENHYDRAMINE HCL 50 MG/ML IJ SOLN
25.0000 mg | Freq: Once | INTRAMUSCULAR | Status: AC
  Administered 2012-09-13: 25 mg via INTRAVENOUS
  Filled 2012-09-13: qty 1

## 2012-09-13 MED ORDER — DIPHENHYDRAMINE HCL 25 MG PO TABS: 25.0000 mg | ORAL_TABLET | Freq: Four times a day (QID) | ORAL | Status: DC | PRN

## 2012-09-13 MED ORDER — DIAZEPAM 2 MG PO TABS
2.0000 mg | ORAL_TABLET | Freq: Once | ORAL | Status: AC
  Administered 2012-09-13: 2 mg via ORAL
  Filled 2012-09-13: qty 1

## 2012-09-13 MED ORDER — HYDROMORPHONE HCL PF 1 MG/ML IJ SOLN
1.0000 mg | Freq: Once | INTRAMUSCULAR | Status: AC
  Administered 2012-09-13: 1 mg via INTRAVENOUS
  Filled 2012-09-13: qty 1

## 2012-09-13 MED ORDER — HYDROCODONE-ACETAMINOPHEN 5-325 MG PO TABS: 1.0000 | ORAL_TABLET | Freq: Four times a day (QID) | ORAL | Status: DC | PRN

## 2012-09-13 MED ORDER — METHOCARBAMOL 500 MG PO TABS
500.0000 mg | ORAL_TABLET | Freq: Once | ORAL | Status: AC
  Administered 2012-09-13: 500 mg via ORAL
  Filled 2012-09-13: qty 1

## 2012-09-13 MED ORDER — IBUPROFEN 400 MG PO TABS: 600.0000 mg | ORAL_TABLET | Freq: Four times a day (QID) | ORAL | Status: DC | PRN

## 2012-09-13 MED ORDER — HYDROCODONE-ACETAMINOPHEN 5-325 MG PO TABS
2.0000 | ORAL_TABLET | Freq: Once | ORAL | Status: AC
  Administered 2012-09-13: 2 via ORAL
  Filled 2012-09-13: qty 2

## 2012-09-13 NOTE — ED Notes (Signed)
Pt escorted to discharge window. Pt verbalized understanding discharge instructions. In no acute distress.  

## 2012-09-13 NOTE — ED Provider Notes (Addendum)
History     CSN: [id number]  Arrival date & time 09/13/12  0803   First MD Initiated Contact with Patient 09/13/12 0825      Chief Complaint  Patient presents with  . Headache    (Consider location/radiation/quality/duration/timing/severity/associated sxs/prior treatment) HPI Comments: 47 y/o with hx of migraines and hep c comes in with cc of headaches and neck pain. Pt started having headaches 2 weeks ago, and then developed neck pain with stiffness. The h/a is not similar to her migraine. It is still right sided unilateral sharp pain, but it is radiating down her entire back and occiput as well. The headache has no photophobia, phonophobia. It was mild initially, and now moderately severe, and she has had worst headaches. She is taking pain meds, and muscle relaxants with no relief at this time. The neck pain goes all the way down her spine, and is worse with neck movement. She as seen at [facility] and was injected with some meds, which made her transiently better. No trauma. Pt has no nausea, vomiting, visual complains, seizures, altered mental status, loss of consciousness, new weakness, or numbness, no gait instability.  Patient is a 47 y.o. female presenting with headaches. The history is provided by the patient.  Headache  Pertinent negatives include no shortness of breath, no nausea and no vomiting.    Past Medical History  Diagnosis Date  . Hypertension   . Hepatitis C     Past Surgical History  Procedure Date  . Abdominal hysterectomy   . Sinus exploration   . Lithotripsy     No family history on file.  History  Substance Use Topics  . Smoking status: Current Every Day Smoker -- 1.5 years    Types: Cigarettes  . Smokeless tobacco: Never Used  . Alcohol Use: No    OB History    Grav Para Term Preterm Abortions TAB SAB Ect Mult Living                  Review of Systems  Constitutional: Negative for activity change.  HENT: Positive for neck pain and neck  stiffness. Negative for facial swelling.   Respiratory: Negative for cough, shortness of breath and wheezing.   Cardiovascular: Negative for chest pain.  Gastrointestinal: Negative for nausea, vomiting, abdominal pain, diarrhea, constipation, blood in stool and abdominal distention.  Genitourinary: Negative for hematuria and difficulty urinating.  Skin: Negative for color change.  Neurological: Positive for headaches. Negative for speech difficulty.  Hematological: Does not bruise/bleed easily.  Psychiatric/Behavioral: Negative for confusion.    Allergies  Aspirin; Codeine; Compazine; Ivp dye; Ketorolac tromethamine; Penicillins; Reglan; Tegretol; and Tramadol  Home Medications   Current Outpatient Rx  Name  Route  Sig  Dispense  Refill  . ALPRAZOLAM 2 MG PO TABS   Oral   Take 2 mg by mouth daily before breakfast. anxiety         . BC HEADACHE PO   Oral   Take 1 Package by mouth every 6 (six) hours as needed. Headache         . HYDROCHLOROTHIAZIDE 25 MG PO TABS   Oral   Take 25 mg by mouth daily.         Marland Kitchen LISINOPRIL 10 MG PO TABS   Oral   Take 10 mg by mouth daily.         . OXYBUTYNIN CHLORIDE 5 MG PO TABS   Oral   Take 5 mg by mouth 3 (three) times  daily.         Marland Kitchen PROMETHAZINE HCL 25 MG PO TABS   Oral   Take 1 tablet (25 mg total) by mouth every 6 (six) hours as needed for nausea.   10 tablet   0   . SERTRALINE HCL 100 MG PO TABS   Oral   Take 150 mg by mouth at bedtime. TAKES 1 AND 1/2         . ZOLPIDEM TARTRATE 10 MG PO TABS   Oral   Take 10 mg by mouth at bedtime as needed. SLEEP           BP 144/96  Pulse 92  Temp 98 F (36.7 C) (Oral)  Resp 18  SpO2 98%  Physical Exam  Nursing note and vitals reviewed. Constitutional: She is oriented to person, place, and time. She appears well-developed.  HENT:  Head: Normocephalic and atraumatic.  Eyes: Conjunctivae normal and EOM are normal. Pupils are equal, round, and reactive to light.    Neck: Normal range of motion. Neck supple.  Cardiovascular: Normal rate, regular rhythm and normal heart sounds.   Pulmonary/Chest: Effort normal and breath sounds normal. No respiratory distress.  Abdominal: Soft. Bowel sounds are normal. She exhibits no distension. There is no tenderness. There is no rebound and no guarding.  Musculoskeletal:       Pt has noticeable spasming of her back, and she has a large lump on her neck, that is tender to palpation   Neurological: She is alert and oriented to person, place, and time. No cranial nerve deficit.       Cerebellar exam is normal (finger to nose) Sensory exam normal for bilateral upper and lower extremities - and patient is able to discriminate between sharp and dull. Motor exam is 4+/5  Skin: Skin is warm and dry.    ED Course  Procedures (including critical care time)  Labs Reviewed - No data to display No results found.   No diagnosis found.    MDM  Pt comes in with cc of headaches, neck pain and stiffness. No redflags with the headaches that started 2 weeks ago. 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 No neuro complains or risk factors to suggest carotid dissection, especially in the setting of the findings mentioned above.  Pt has several allergies. We will five valium po now with some headache meds. Pt will need Neurologist f/u.  Derwood Kaplan, MD 09/13/12 0932  10:26 AM Headache is persistent. Still no indication for CT or LP. Neuro f.u is provided.  Derwood Kaplan, MD 09/13/12 1027

## 2012-09-13 NOTE — ED Notes (Signed)
Pt presents w/ headache x 2 wks. States she was treated at [facility] and had 2 injections in her neck to make it feel better. States since then the pain has worsened, her neck is stiff and hurts down into her back, and her head is throbbing all the time.

## 2012-09-13 NOTE — ED Notes (Signed)
Pt alert and oriented x4. Respirations even and unlabored, bilateral symmetrical rise and fall of chest. Skin warm and dry. In no acute distress. Denies needs.   

## 2012-09-13 NOTE — ED Notes (Signed)
md alerted pt reports increasing pain

## 2012-09-14 ENCOUNTER — Emergency Department: Payer: Self-pay | Admitting: Internal Medicine

## 2012-09-17 ENCOUNTER — Emergency Department: Payer: Self-pay | Admitting: Emergency Medicine

## 2012-09-18 IMAGING — CT CT CERVICAL SPINE W/O CM
4 of 5 series · 15 of 33 positions shown, 17 images · non-contrast
Comparison: None.

CT HEAD

CLINICAL DATA: 44-year-old female status post fall with pain and
blurry vision.

CT HEAD WITHOUT CONTRAST
CT CERVICAL SPINE WITHOUT CONTRAST
TECHNIQUE: Multidetector CT imaging of the head and cervical spine
was performed following the standard protocol without intravenous
contrast.  Multiplanar CT image reconstructions of the cervical
spine were also generated.

[Series 4: c_spine 2.0 b31s · axial · 0.24mm/px · z∈[-236,-164]mm · 3 of 74 slices shown]
[im 19/74  bone]
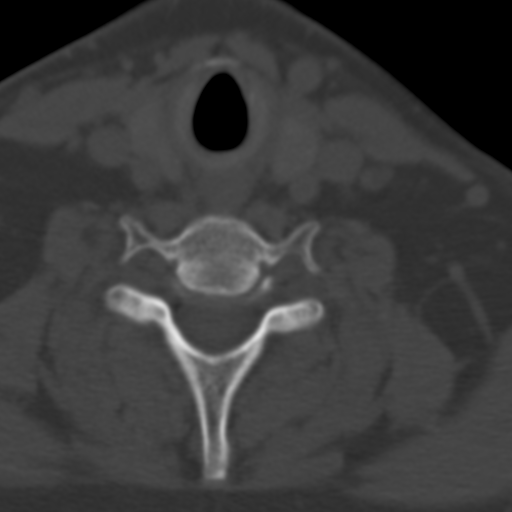
[im 37/74  bone]
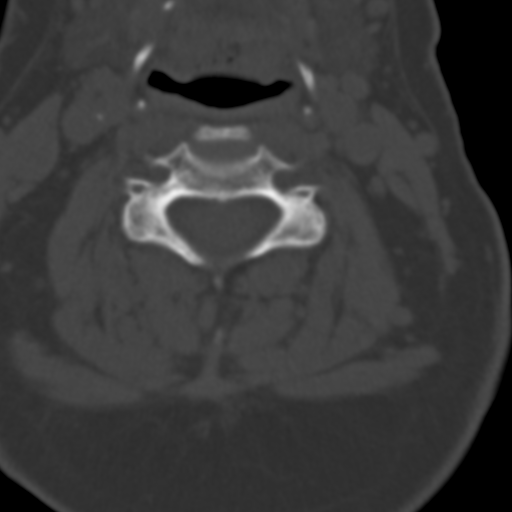
[im 55/74  bone]
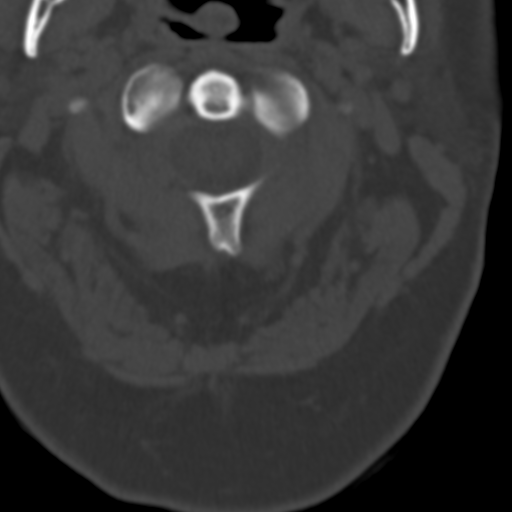

[Series 602: axial cspine · axial · 0.29mm/px · z∈[-282,-198]mm · 4 of 82 slices shown, 5 images]
[im 17/82  soft-tissue]
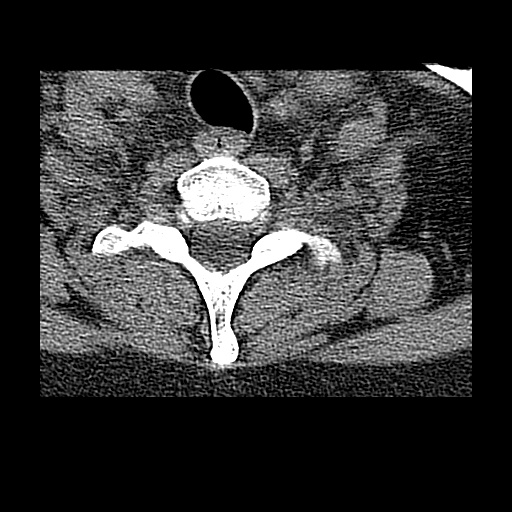
[im 17/82  bone]
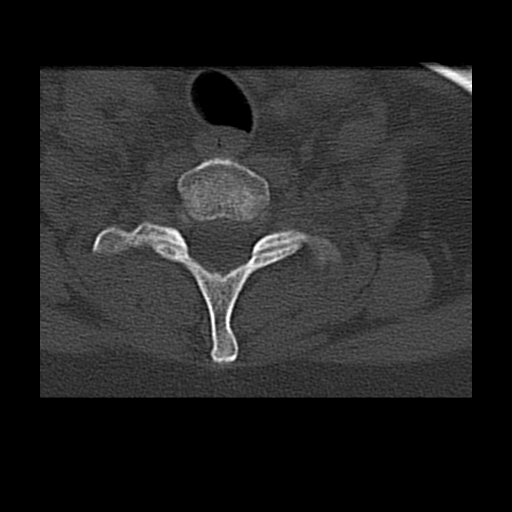
[im 33/82  bone]
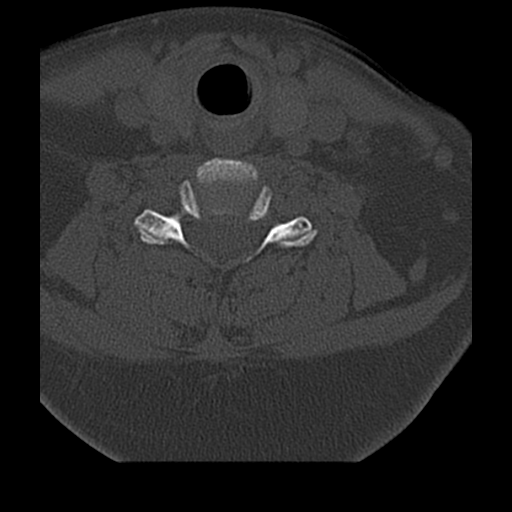
[im 49/82  bone]
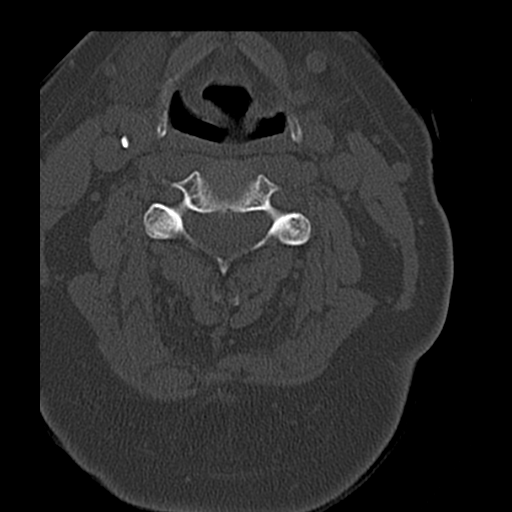
[im 65/82  bone]
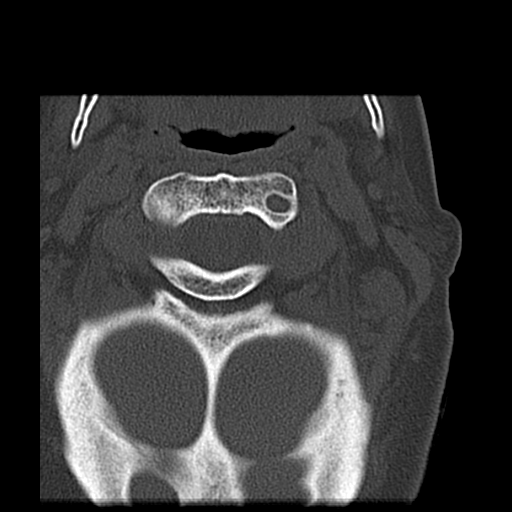

[Series 603: coronal · coronal · 0.29mm/px · 3 of 50 slices shown]
[im 10/50  bone]
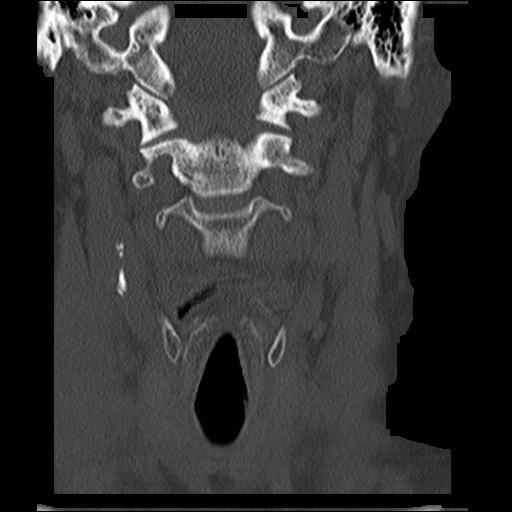
[im 20/50  bone]
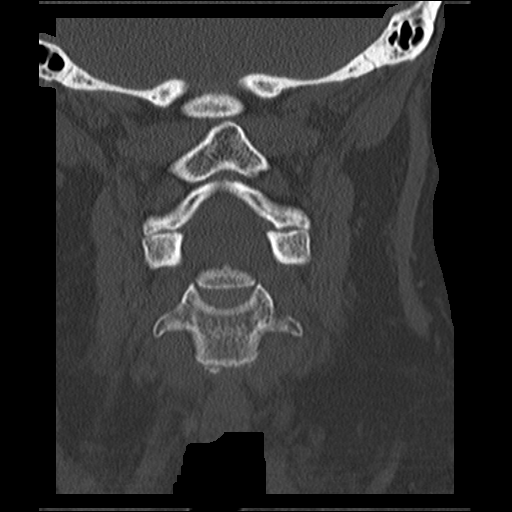
[im 30/50  bone]
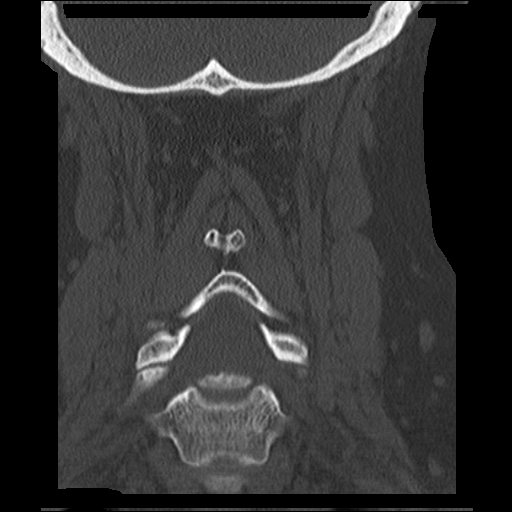

[Series 604: sagittal cspine · sagittal · 0.29mm/px · 5 of 48 slices shown, 6 images]
[im 16/48  bone]
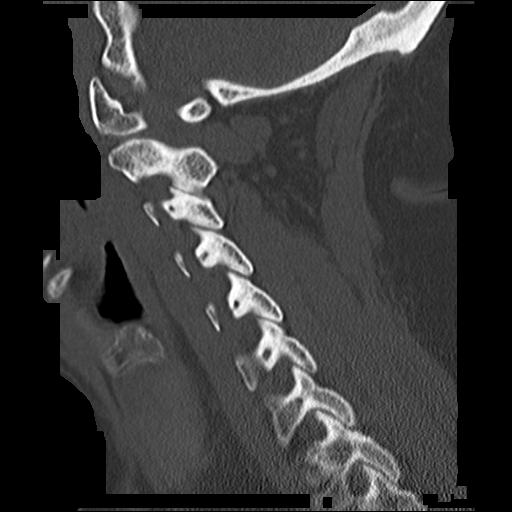
[im 20/48  bone]
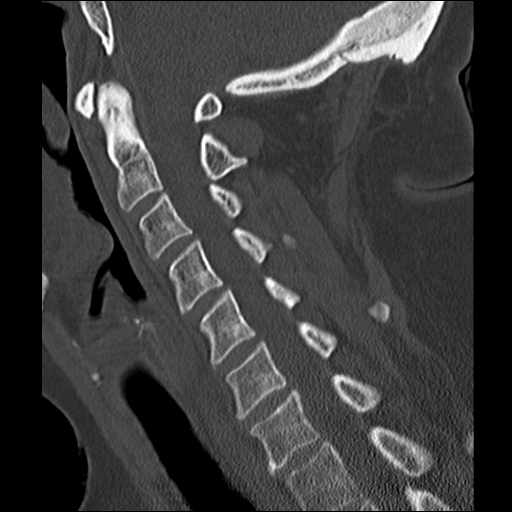
[im 24/48  soft-tissue]
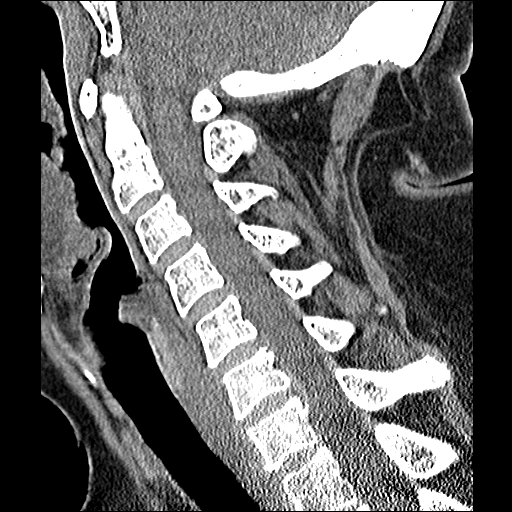
[im 24/48  bone]
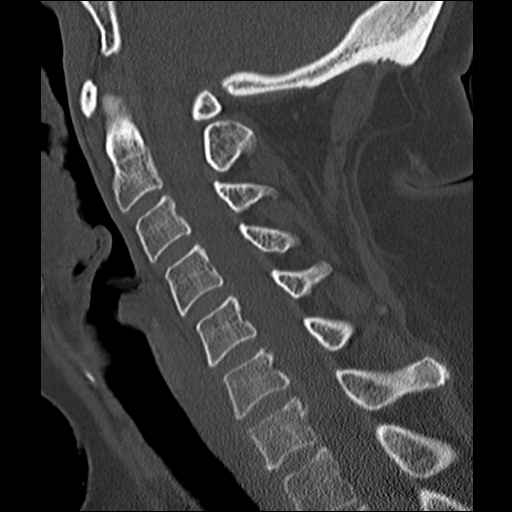
[im 28/48  bone]
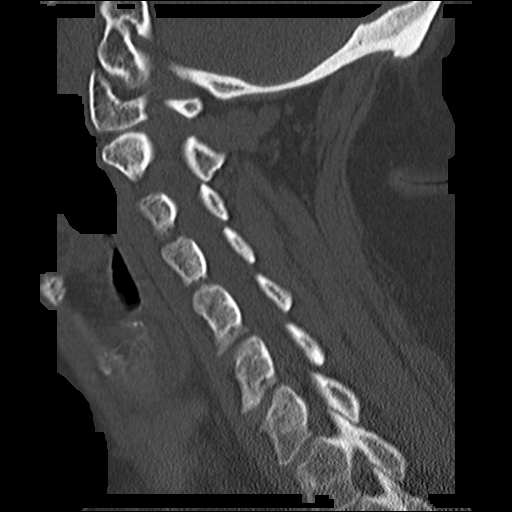
[im 32/48  bone]
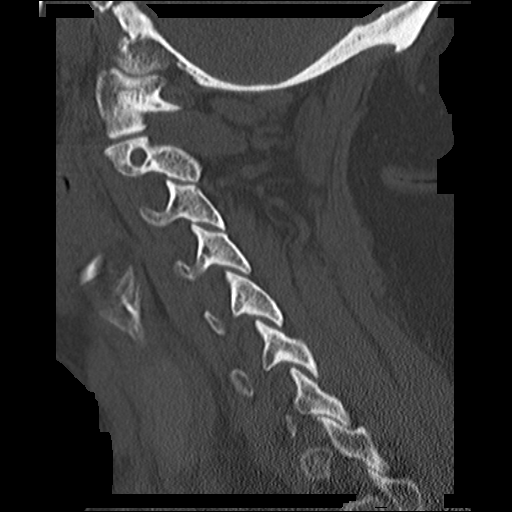

[15 of 33 positions shown; findings below may reference images not displayed]

FINDINGS: 10 mm thick left posterior convexity scalp hematoma.
Calvarial hyperostosis, no underlying fracture identified.  Mucous
retention cyst left maxillary sinus, mild paranasal sinus mucosal
thickening elsewhere.  Mastoids are clear. Visualized orbit soft
tissues are within normal limits.

Cerebral volume is within normal limits for age.  No midline shift,
ventriculomegaly, mass effect, evidence of mass lesion,
intracranial hemorrhage or evidence of cortically based acute
infarction.  Gray-white matter differentiation is within normal
limits throughout the brain.  No suspicious intracranial vascular
hyperdensity.
IMPRESSION: 1.  Left posterior scalp hematoma without underlying fracture.
2. Normal noncontrast CT appearance of the brain for age.

CT CERVICAL SPINE
FINDINGS: Straightening of cervical lordosis. Visualized skull base
is intact.  No atlanto-occipital dissociation.  Bilateral posterior
element alignment is within normal limits.  Cervicothoracic
junction alignment is within normal limits.  No acute cervical
fracture. Visualized paraspinal soft tissues are within normal
limits.
IMPRESSION: No acute fracture or listhesis identified in the cervical spine.
Ligamentous injury is not excluded.

## 2012-10-02 DIAGNOSIS — R109 Unspecified abdominal pain: Secondary | ICD-10-CM | POA: Insufficient documentation

## 2012-10-03 ENCOUNTER — Emergency Department (HOSPITAL_COMMUNITY): Payer: Self-pay

## 2012-10-03 ENCOUNTER — Encounter (HOSPITAL_COMMUNITY): Payer: Self-pay | Admitting: Emergency Medicine

## 2012-10-03 ENCOUNTER — Emergency Department (HOSPITAL_COMMUNITY)
Admission: EM | Admit: 2012-10-03 | Discharge: 2012-10-03 | Disposition: A | Discharge status: Home / Self Care | Payer: Self-pay | Attending: Emergency Medicine | Admitting: Emergency Medicine

## 2012-10-03 DIAGNOSIS — Z90710 Acquired absence of both cervix and uterus: Secondary | ICD-10-CM | POA: Insufficient documentation

## 2012-10-03 DIAGNOSIS — Z79899 Other long term (current) drug therapy: Secondary | ICD-10-CM | POA: Insufficient documentation

## 2012-10-03 DIAGNOSIS — M549 Dorsalgia, unspecified: Secondary | ICD-10-CM | POA: Insufficient documentation

## 2012-10-03 DIAGNOSIS — R112 Nausea with vomiting, unspecified: Secondary | ICD-10-CM | POA: Insufficient documentation

## 2012-10-03 DIAGNOSIS — Z8619 Personal history of other infectious and parasitic diseases: Secondary | ICD-10-CM | POA: Insufficient documentation

## 2012-10-03 DIAGNOSIS — I10 Essential (primary) hypertension: Secondary | ICD-10-CM | POA: Insufficient documentation

## 2012-10-03 DIAGNOSIS — R109 Unspecified abdominal pain: Secondary | ICD-10-CM | POA: Insufficient documentation

## 2012-10-03 DIAGNOSIS — Z7982 Long term (current) use of aspirin: Secondary | ICD-10-CM | POA: Insufficient documentation

## 2012-10-03 LAB — URINALYSIS, ROUTINE W REFLEX MICROSCOPIC
Glucose, UA: NEGATIVE mg/dL
Ketones, ur: NEGATIVE mg/dL
Protein, ur: NEGATIVE mg/dL
Urobilinogen, UA: 0.2 mg/dL (ref 0.0–1.0)

## 2012-10-03 LAB — COMPREHENSIVE METABOLIC PANEL
AST: 18 U/L (ref 0–37)
Albumin: 4.1 g/dL (ref 3.5–5.2)
Calcium: 10.4 mg/dL (ref 8.4–10.5)
Chloride: 99 mEq/L (ref 96–112)
Creatinine, Ser: 0.64 mg/dL (ref 0.50–1.10)

## 2012-10-03 LAB — CBC WITH DIFFERENTIAL/PLATELET
Basophils Absolute: 0 10*3/uL (ref 0.0–0.1)
Basophils Relative: 0 % (ref 0–1)
Eosinophils Relative: 1 % (ref 0–5)
HCT: 42.6 % (ref 36.0–46.0)
MCHC: 35.4 g/dL (ref 30.0–36.0)
Monocytes Absolute: 1.1 10*3/uL — ABNORMAL HIGH (ref 0.1–1.0)
Neutro Abs: 4.6 10*3/uL (ref 1.7–7.7)
RDW: 13.4 % (ref 11.5–15.5)

## 2012-10-03 LAB — URINE MICROSCOPIC-ADD ON

## 2012-10-03 MED ORDER — HYDROCODONE-ACETAMINOPHEN 5-325 MG PO TABS: 1.0000 | ORAL_TABLET | ORAL | Status: DC | PRN

## 2012-10-03 MED ORDER — ONDANSETRON HCL 4 MG/2ML IJ SOLN
4.0000 mg | Freq: Once | INTRAMUSCULAR | Status: AC
  Administered 2012-10-03: 4 mg via INTRAVENOUS
  Filled 2012-10-03: qty 2

## 2012-10-03 MED ORDER — MORPHINE SULFATE 4 MG/ML IJ SOLN
4.0000 mg | Freq: Once | INTRAMUSCULAR | Status: AC
  Administered 2012-10-03: 4 mg via INTRAVENOUS
  Filled 2012-10-03: qty 1

## 2012-10-03 NOTE — ED Notes (Signed)
Pt. Stated, My back hurts on both sides "flank" since Monday. I have a history of kidney stones with surgery

## 2012-10-03 NOTE — ED Provider Notes (Signed)
History     CSN: [id number]  Arrival date & time 10/03/12  1426   First MD Initiated Contact with Patient 10/03/12 1506      Chief Complaint  Patient presents with  . Flank Pain    (Consider location/radiation/quality/duration/timing/severity/associated sxs/prior treatment) HPI Pt with 1 week of RUQ/R flank pain. Pain is worse with movement but otherwise constant. Pt has persistent vomiting without changes. No vomiting, fever, SOB, urinary changes. Pt states she has tried to take ibuprofen with little relief.  Past Medical History  Diagnosis Date  . Hypertension   . Hepatitis C     Past Surgical History  Procedure Date  . Abdominal hysterectomy   . Sinus exploration   . Lithotripsy     No family history on file.  History  Substance Use Topics  . Smoking status: Current Every Day Smoker -- 1.5 years    Types: Cigarettes  . Smokeless tobacco: Never Used  . Alcohol Use: No    OB History    Grav Para Term Preterm Abortions TAB SAB Ect Mult Living                  Review of Systems  Constitutional: Negative for fever and chills.  Respiratory: Negative for shortness of breath.   Cardiovascular: Negative for chest pain.  Gastrointestinal: Positive for nausea and abdominal pain. Negative for vomiting, diarrhea and constipation.  Genitourinary: Positive for flank pain. Negative for dysuria and hematuria.  Musculoskeletal: Positive for back pain.  Skin: Negative for pallor, rash and wound.  All other systems reviewed and are negative.    Allergies  Ambien; Aspirin; Codeine; Compazine; Ivp dye; Ketorolac tromethamine; Penicillins; Reglan; Tegretol; and Tramadol  Home Medications   Current Outpatient Rx  Name  Route  Sig  Dispense  Refill  . BC HEADACHE PO   Oral   Take 1 Package by mouth every 6 (six) hours as needed. Headache         . DIPHENHYDRAMINE HCL 25 MG PO TABS   Oral   Take 1 tablet (25 mg total) by mouth every 6 (six) hours as needed for  itching.   20 tablet   0   . HYDROCHLOROTHIAZIDE 25 MG PO TABS   Oral   Take 25 mg by mouth daily.         Marland Kitchen LISINOPRIL 10 MG PO TABS   Oral   Take 10 mg by mouth daily.         . OXYBUTYNIN CHLORIDE 5 MG PO TABS   Oral   Take 5 mg by mouth 3 (three) times daily.         Marland Kitchen HYDROCODONE-ACETAMINOPHEN 5-325 MG PO TABS   Oral   Take 1 tablet by mouth every 4 (four) hours as needed for pain.   15 tablet   0     BP 126/84  Pulse 84  Temp 97.5 F (36.4 C) (Oral)  Resp 16  SpO2 95%  Physical Exam  Nursing note and vitals reviewed. Constitutional: She is oriented to person, place, and time. She appears well-developed and well-nourished. No distress.  HENT:  Head: Normocephalic and atraumatic.  Mouth/Throat: Oropharynx is clear and moist.  Eyes: EOM are normal. Pupils are equal, round, and reactive to light.  Neck: Normal range of motion. Neck supple.  Cardiovascular: Normal rate and regular rhythm.   Pulmonary/Chest: Effort normal and breath sounds normal. No respiratory distress. She has no wheezes. She has no rales.  Abdominal: Soft. Bowel  sounds are normal. She exhibits no distension and no mass. There is tenderness (RUQ TTP without guarding or rebound). There is no rebound and no guarding.  Musculoskeletal: Normal range of motion. She exhibits tenderness (TTP over R superior lumbar paraspinal muscles and R flank. ). She exhibits no edema.  Neurological: She is alert and oriented to person, place, and time.  Skin: Skin is warm and dry. No rash noted. No erythema.  Psychiatric: She has a normal mood and affect. Her behavior is normal.    ED Course  Procedures (including critical care time)  Labs Reviewed  URINALYSIS, ROUTINE W REFLEX MICROSCOPIC - Abnormal; Notable for the following:    Leukocytes, UA SMALL (*)     All other components within normal limits  CBC WITH DIFFERENTIAL - Abnormal; Notable for the following:    Hemoglobin 15.1 (*)     Monocytes  Absolute 1.1 (*)     All other components within normal limits  COMPREHENSIVE METABOLIC PANEL  LIPASE, BLOOD  URINE MICROSCOPIC-ADD ON   US Abdomen Complete  10/03/2012  *RADIOLOGY REPORT*  Clinical Data:  Right quadrant pain.  Hypertension.  Hepatitis C.  COMPLETE ABDOMINAL ULTRASOUND  Comparison:  06/27/2012 renal ultrasound.  12/25/2011 CT.  Findings:  Gallbladder:  No gallstones or gallbladder wall thickening. Initially, possibility of pericholecystic fluid is raised.  On decubitus views, this does not persist.  The patient was not tender over this region during scanning as per ultrasound technologist.  Common bile duct:  3.5 mm.  Liver:  Mild diffuse increased echogenicity suggestive of fatty infiltration.  No focal hepatic lesion or intrapelvic biliary duct dilation.  IVC:  Appears normal.  Pancreas:  Portions poorly delineated secondary to overlying bowel gas.  The portions which are visualized are unremarkable.  Spleen:  5.5 cm.  No focal mass.  Right Kidney:  9.6 cm.  Lobulated contour consistent with areas of cortical scarring as previously noted. This limits detection of renal mass.  CT detected renal calculi not as well demonstrated present exam.  No hydronephrosis.  Left Kidney:  10.0 cm.  Lobulated contour consistent with areas of cortical scarring as previously noted. This limits detection of renal mass.  CT detected renal calculi not as well demonstrated present exam.  No hydronephrosis.  Abdominal aorta:  No aneurysm identified.  IMPRESSION: No gallstones or ultrasound findings of cholecystitis.  Mild fatty infiltration liver.  Renal cortical irregularity consistent with scarring as noted on prior exams.  The previously noted renal calculi are not as well demonstrated present exam.  No evidence hydronephrosis.  Portions of pancreas poorly delineated secondary to overlying bowel gas.   Original Report Authenticated By: Lacy Duverney, M.D.      1. Abdominal wall pain   2. Right flank pain         MDM   Normal workup. Suspect muscular origin of pain. Advised to f/u with her urologist and return for acute changes.        Loren Racer, MD 10/03/12 [phone]

## 2012-11-18 IMAGING — CT CT ABD-PELV W/O CM
1 series · 15 of 26 positions shown, 19 images · non-contrast
Comparison: None

CLINICAL DATA: Right flank, abdominal and pelvic pain.

CT ABDOMEN AND PELVIS WITHOUT CONTRAST
TECHNIQUE: Multidetector CT imaging of the abdomen and pelvis was
performed following the standard protocol without intravenous
contrast.

[Series 4: lung windows · axial · 0.74mm/px · z∈[-135,-20]mm · 15 of 26 slices shown, 19 images]
[im 2/26  soft-tissue]
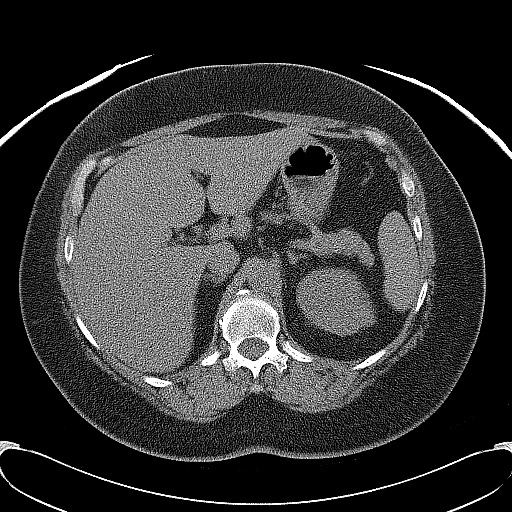
[im 2/26  bone]
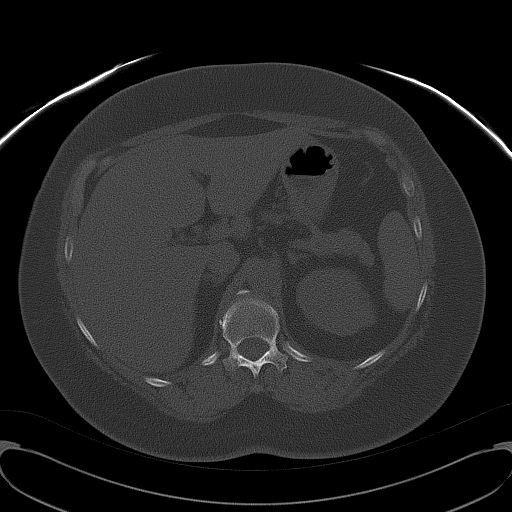
[im 4/26  soft-tissue]
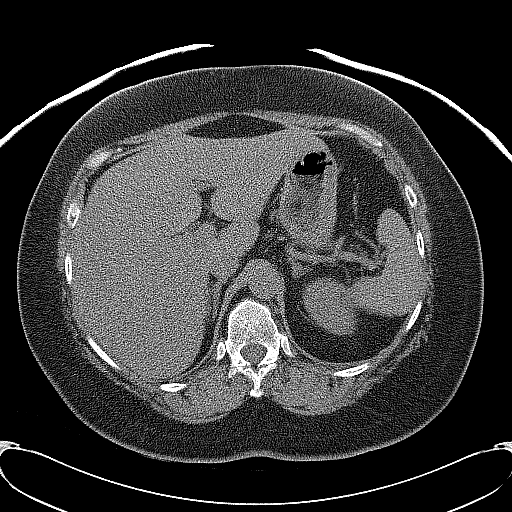
[im 6/26  soft-tissue]
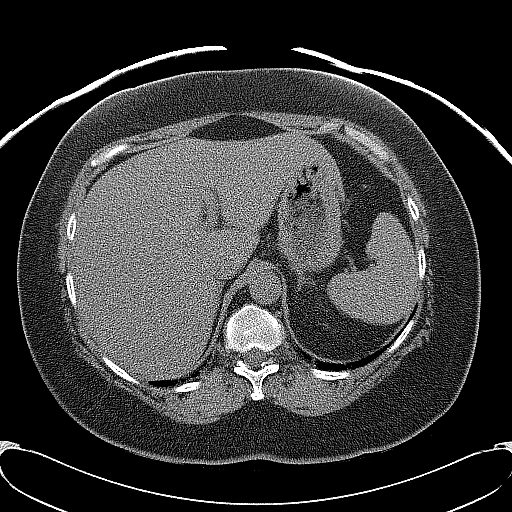
[im 8/26  soft-tissue]
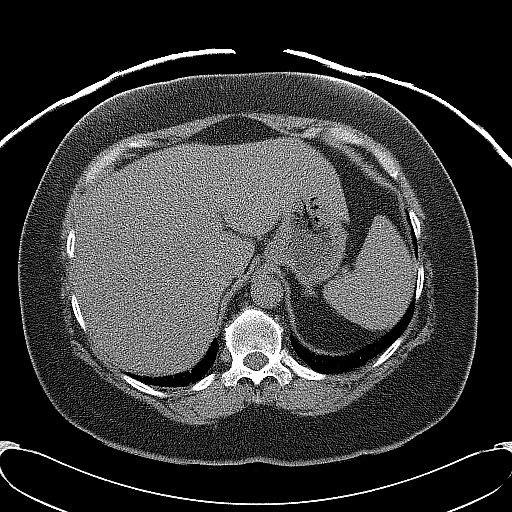
[im 10/26  soft-tissue]
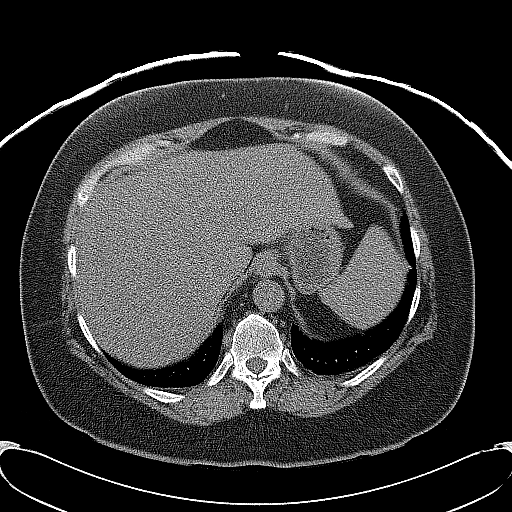
[im 12/26  soft-tissue]
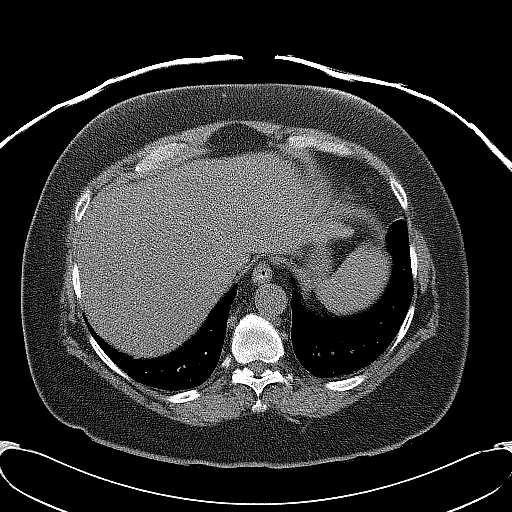
[im 14/26  soft-tissue]
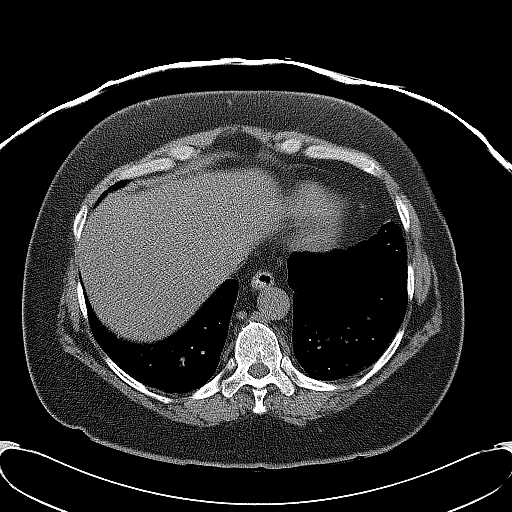
[im 15/26  soft-tissue]
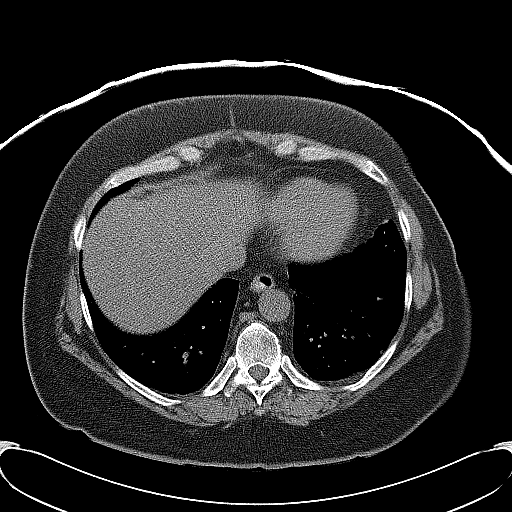
[im 17/26  soft-tissue]
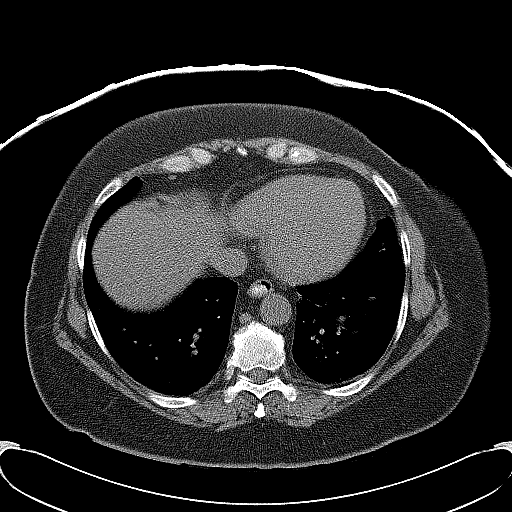
[im 17/26  bone]
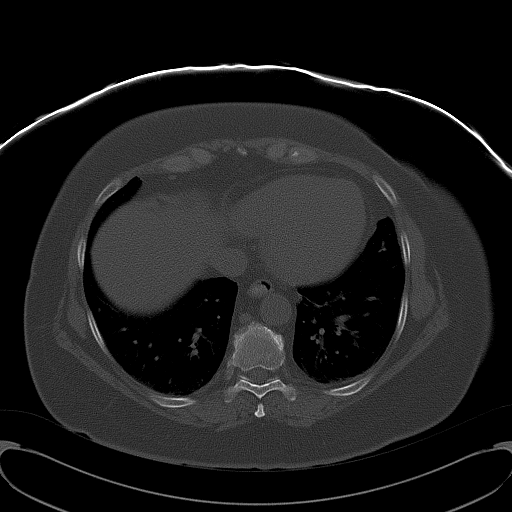
[im 19/26  soft-tissue]
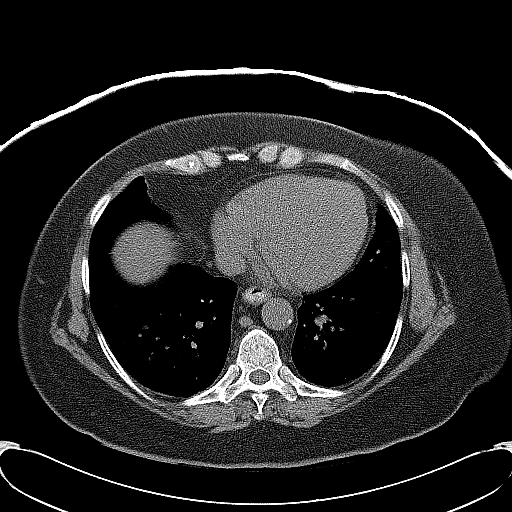
[im 21/26  soft-tissue]
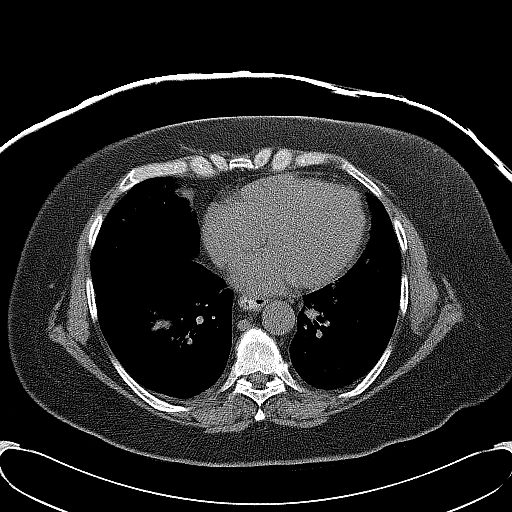
[im 22/26  lung]
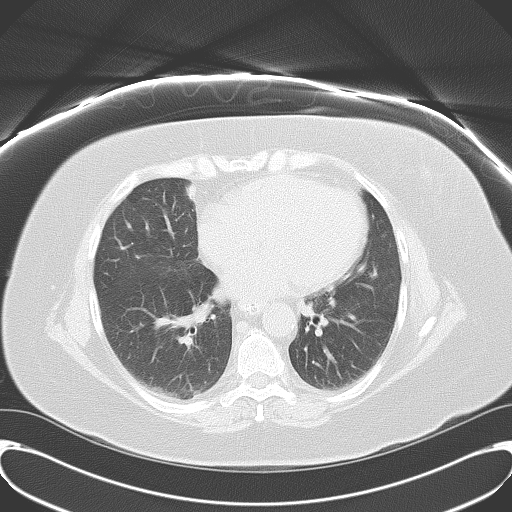
[im 23/26  soft-tissue]
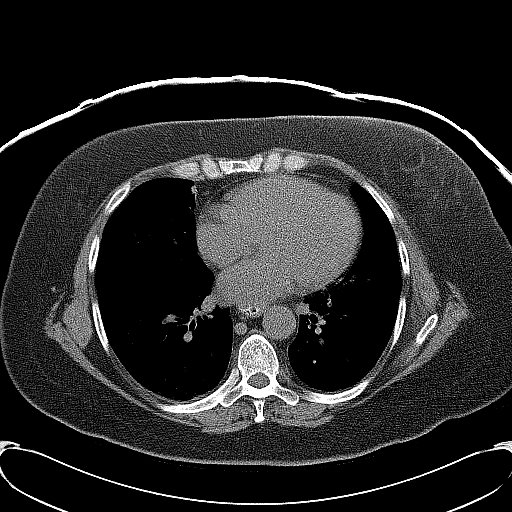
[im 23/26  lung]
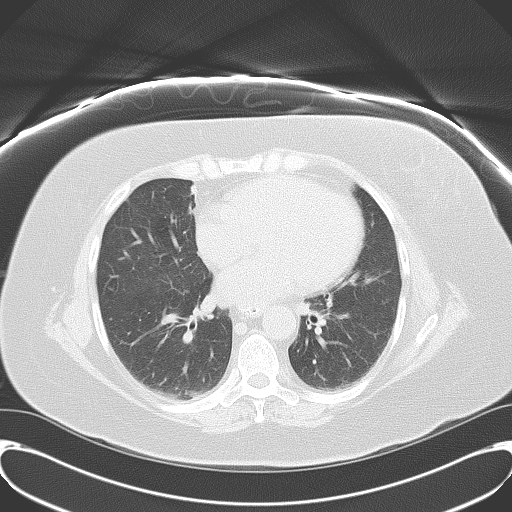
[im 24/26  lung]
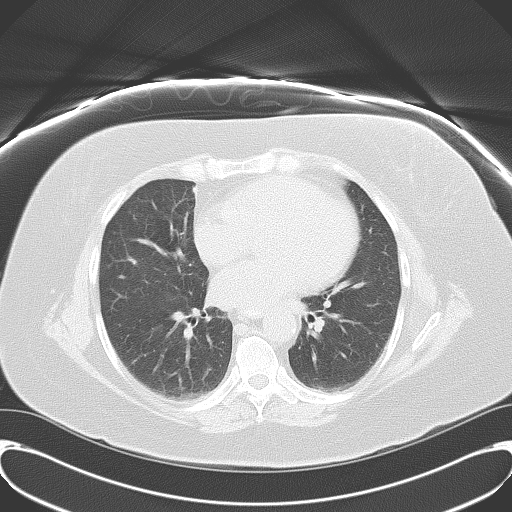
[im 25/26  soft-tissue]
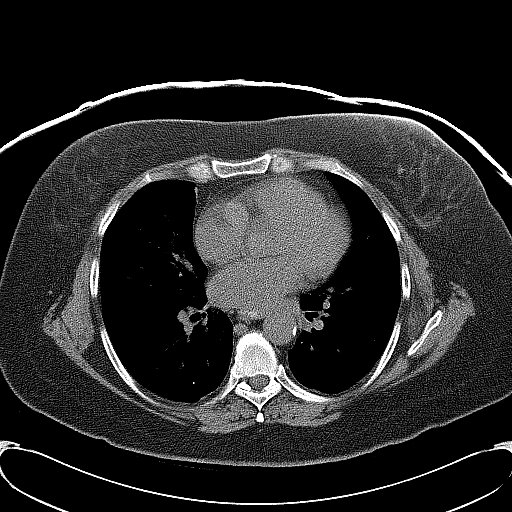
[im 25/26  lung]
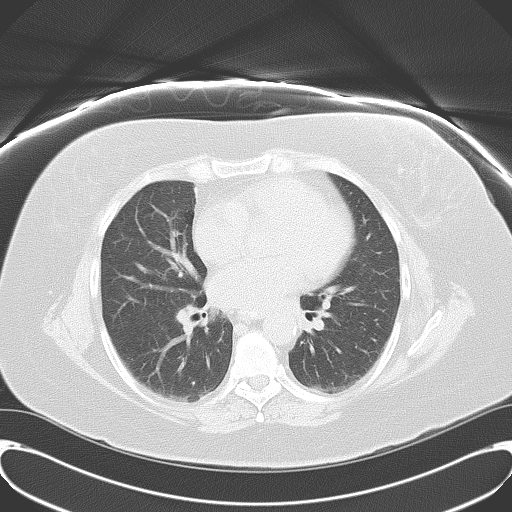

[15 of 26 positions shown; findings below may reference images not displayed]

FINDINGS: The liver, gallbladder, spleen, adrenal glands and
pancreas are unremarkable.
Multiple bilateral nonobstructing renal calculi are identified,
right greater than left, with large right renal calculi
identified.
Scattered areas of right renal scarring are identified.
There is no evidence of hydronephrosis or obstructing urinary
calculi.

Please note that parenchymal abnormalities may be missed as
intravenous contrast was not administered.
No free fluid, enlarged lymph nodes, biliary dilation or abdominal
aortic aneurysm identified.
Hysterectomy identified.

The bowel, appendix and bladder are unremarkable.
No acute or suspicious bony abnormalities are present.
Moderate to severe degenerative disc disease at L5-S1 noted.
IMPRESSION: No evidence of acute abnormality.

Multiple nonobstructing bilateral renal calculi, right greater than
left.

Moderate to severe L5-S1 degenerative changes.

## 2012-11-20 ENCOUNTER — Encounter (HOSPITAL_COMMUNITY): Payer: Self-pay | Admitting: Emergency Medicine

## 2012-11-20 ENCOUNTER — Emergency Department (HOSPITAL_COMMUNITY): Payer: Self-pay

## 2012-11-20 ENCOUNTER — Emergency Department (HOSPITAL_COMMUNITY)
Admission: EM | Admit: 2012-11-20 | Discharge: 2012-11-20 | Disposition: A | Discharge status: Home / Self Care | Payer: Self-pay | Attending: Emergency Medicine | Admitting: Emergency Medicine

## 2012-11-20 DIAGNOSIS — Z8619 Personal history of other infectious and parasitic diseases: Secondary | ICD-10-CM | POA: Insufficient documentation

## 2012-11-20 DIAGNOSIS — Z8739 Personal history of other diseases of the musculoskeletal system and connective tissue: Secondary | ICD-10-CM | POA: Insufficient documentation

## 2012-11-20 DIAGNOSIS — M549 Dorsalgia, unspecified: Secondary | ICD-10-CM | POA: Insufficient documentation

## 2012-11-20 DIAGNOSIS — I10 Essential (primary) hypertension: Secondary | ICD-10-CM | POA: Insufficient documentation

## 2012-11-20 DIAGNOSIS — Z79899 Other long term (current) drug therapy: Secondary | ICD-10-CM | POA: Insufficient documentation

## 2012-11-20 DIAGNOSIS — R110 Nausea: Secondary | ICD-10-CM | POA: Insufficient documentation

## 2012-11-20 DIAGNOSIS — R109 Unspecified abdominal pain: Secondary | ICD-10-CM | POA: Insufficient documentation

## 2012-11-20 DIAGNOSIS — F17200 Nicotine dependence, unspecified, uncomplicated: Secondary | ICD-10-CM | POA: Insufficient documentation

## 2012-11-20 DIAGNOSIS — Z90710 Acquired absence of both cervix and uterus: Secondary | ICD-10-CM | POA: Insufficient documentation

## 2012-11-20 DIAGNOSIS — G8929 Other chronic pain: Secondary | ICD-10-CM

## 2012-11-20 HISTORY — DX: Sciatica, unspecified side: M54.30

## 2012-11-20 LAB — URINE MICROSCOPIC-ADD ON

## 2012-11-20 LAB — CBC WITH DIFFERENTIAL/PLATELET
Basophils Relative: 0 % (ref 0–1)
Hemoglobin: 15.4 g/dL — ABNORMAL HIGH (ref 12.0–15.0)
Lymphs Abs: 3.9 10*3/uL (ref 0.7–4.0)
Monocytes Relative: 10 % (ref 3–12)
Neutro Abs: 4.1 10*3/uL (ref 1.7–7.7)
Neutrophils Relative %: 45 % (ref 43–77)
RBC: 4.82 MIL/uL (ref 3.87–5.11)

## 2012-11-20 LAB — URINALYSIS, ROUTINE W REFLEX MICROSCOPIC
Bilirubin Urine: NEGATIVE
Hgb urine dipstick: NEGATIVE
Ketones, ur: NEGATIVE mg/dL
Nitrite: NEGATIVE
pH: 6 (ref 5.0–8.0)

## 2012-11-20 LAB — COMPREHENSIVE METABOLIC PANEL
Albumin: 3.8 g/dL (ref 3.5–5.2)
Alkaline Phosphatase: 73 U/L (ref 39–117)
BUN: 16 mg/dL (ref 6–23)
Chloride: 102 mEq/L (ref 96–112)
Glucose, Bld: 86 mg/dL (ref 70–99)
Potassium: 4.1 mEq/L (ref 3.5–5.1)
Total Bilirubin: 0.2 mg/dL — ABNORMAL LOW (ref 0.3–1.2)

## 2012-11-20 LAB — LIPASE, BLOOD: Lipase: 34 U/L (ref 11–59)

## 2012-11-20 MED ORDER — ONDANSETRON HCL 4 MG/2ML IJ SOLN
4.0000 mg | Freq: Once | INTRAMUSCULAR | Status: AC
  Administered 2012-11-20: 4 mg via INTRAVENOUS
  Filled 2012-11-20: qty 2

## 2012-11-20 MED ORDER — MORPHINE SULFATE 4 MG/ML IJ SOLN
4.0000 mg | Freq: Once | INTRAMUSCULAR | Status: AC
  Administered 2012-11-20: 4 mg via INTRAVENOUS
  Filled 2012-11-20: qty 1

## 2012-11-20 MED ORDER — CYCLOBENZAPRINE HCL 10 MG PO TABS
5.0000 mg | ORAL_TABLET | Freq: Once | ORAL | Status: AC
  Administered 2012-11-20: 5 mg via ORAL
  Filled 2012-11-20: qty 1

## 2012-11-20 MED ORDER — HYDROCODONE-ACETAMINOPHEN 5-325 MG PO TABS
1.0000 | ORAL_TABLET | Freq: Once | ORAL | Status: AC
  Administered 2012-11-20: 1 via ORAL
  Filled 2012-11-20: qty 1

## 2012-11-20 MED ORDER — CYCLOBENZAPRINE HCL 5 MG PO TABS: 5.0000 mg | ORAL_TABLET | Freq: Three times a day (TID) | ORAL | Status: DC | PRN

## 2012-11-20 MED ORDER — HYDROCODONE-ACETAMINOPHEN 5-325 MG PO TABS: 1.0000 | ORAL_TABLET | Freq: Three times a day (TID) | ORAL | Status: DC | PRN

## 2012-11-20 NOTE — ED Provider Notes (Signed)
History     CSN: [id number]  Arrival date & time 11/20/12  1721   First MD Initiated Contact with Patient 11/20/12 1741      Chief Complaint  Patient presents with  . Flank Pain    (Consider location/radiation/quality/duration/timing/severity/associated sxs/prior treatment) HPI  Pt with a few days  of RUQ/R flank pain. Pain is worse with movement but otherwise constant. Pt has persistent vomiting without changes. No vomiting, fever, SOB, urinary changes. Pt states she has tried to take ibuprofen with little relief.Patient was seen in the hospital for the same  On 10/03/2012 and had a full evaluation which resulted as no significant finding. She did not follow-up with referrals given. She was told to see her urologist.   Past Medical History  Diagnosis Date  . Hypertension   . Hepatitis C   . Sciatica     Past Surgical History  Procedure Laterality Date  . Abdominal hysterectomy    . Sinus exploration    . Lithotripsy    . Kidney stone surgery Right 02/12/12  . Kidney stone surgery Left 07/15/2012    History reviewed. No pertinent family history.  History  Substance Use Topics  . Smoking status: Current Every Day Smoker -- 1.5 years    Types: Cigarettes  . Smokeless tobacco: Never Used  . Alcohol Use: No    OB History   Grav Para Term Preterm Abortions TAB SAB Ect Mult Living                  Review of Systems Constitutional: Negative for fever and chills.  Respiratory: Negative for shortness of breath.  Cardiovascular: Negative for chest pain.  Gastrointestinal: Positive for nausea and abdominal pain. Negative for vomiting, diarrhea and constipation.  Genitourinary: Positive for flank pain. Negative for dysuria and hematuria.  Musculoskeletal: Positive for back pain.  Skin: Negative for pallor, rash and wound.  All other systems reviewed and are negative.    Allergies  Aspirin; Codeine; Compazine; Ivp dye; Ketorolac tromethamine; Penicillins; Reglan;  Tegretol; Toradol; and Tramadol  Home Medications   Current Outpatient Rx  Name  Route  Sig  Dispense  Refill  . hydrochlorothiazide (HYDRODIURIL) 25 MG tablet   Oral   Take 25 mg by mouth daily.         Marland Kitchen lisinopril (PRINIVIL,ZESTRIL) 10 MG tablet   Oral   Take 10 mg by mouth daily.         . promethazine (PHENERGAN) 25 MG tablet   Oral   Take 25 mg by mouth every 6 (six) hours as needed for nausea.         Marland Kitchen venlafaxine XR (EFFEXOR-XR) 75 MG 24 hr capsule   Oral   Take 75 mg by mouth 2 (two) times daily.         Marland Kitchen zolpidem (AMBIEN) 10 MG tablet   Oral   Take 10 mg by mouth at bedtime as needed for sleep.           BP 127/87  Pulse 92  Temp(Src) 97.9 F (36.6 C) (Oral)  Ht 5\' 2"  (1.575 m)  Wt 173 lb (78.472 kg)  BMI 31.63 kg/m2  SpO2 95%  Physical Exam Nursing note and vitals reviewed.  Constitutional: She is oriented to person, place, and time. She appears well-developed and well-nourished. No distress.  HENT:  Head: Normocephalic and atraumatic.  Mouth/Throat: Oropharynx is clear and moist.  Eyes: EOM are normal. Pupils are equal, round, and reactive to light.  Neck: Normal range of motion. Neck supple.  Cardiovascular: Normal rate and regular rhythm.  Pulmonary/Chest: Effort normal and breath sounds normal. No respiratory distress. She has no wheezes. She has no rales.  Abdominal: Soft. Bowel sounds are normal. She exhibits no distension and no mass. There is tenderness (RUQ TTP without guarding or rebound). There is no rebound and no guarding.  Musculoskeletal: Normal range of motion. She exhibits tenderness (TTP over R superior lumbar paraspinal muscles and R flank. ). She exhibits no edema.  Neurological: She is alert and oriented to person, place, and time.  Skin: Skin is warm and dry. No rash noted. No erythema.  Psychiatric: She has a normal mood and affect. Her behavior is normal.   ED Course  Procedures (including critical care time)  Labs  Reviewed  CBC WITH DIFFERENTIAL - Abnormal; Notable for the following:    Hemoglobin 15.4 (*)    All other components within normal limits  COMPREHENSIVE METABOLIC PANEL - Abnormal; Notable for the following:    Total Bilirubin 0.2 (*)    All other components within normal limits  URINALYSIS, ROUTINE W REFLEX MICROSCOPIC - Abnormal; Notable for the following:    APPearance CLOUDY (*)    Leukocytes, UA TRACE (*)    All other components within normal limits  URINE MICROSCOPIC-ADD ON - Abnormal; Notable for the following:    Squamous Epithelial / LPF FEW (*)    All other components within normal limits  LIPASE, BLOOD   No results found.   No diagnosis found.    MDM  Patient labs unremarkable. She does have a urologist. She has had this pain before, will add on CT scan to evaluate for intrarenal or intrahepatic abnormality.   End of shift patient hand-off to Sabino Dick, NP to follow-up of abd [place].       Dorthula Matas, PA-C 11/20/12 2017

## 2012-11-20 NOTE — ED Notes (Signed)
Pt comes in private vehicle with family to ED with complaint of right flank pain radiating to entire back and to epigastric region.

## 2012-11-20 NOTE — Discharge Instructions (Signed)
Abdominal Pain (Nonspecific) Your exam might not show the exact reason you have abdominal pain. Since there are many different causes of abdominal pain, another checkup and more tests may be needed. It is very important to follow up for lasting (persistent) or worsening symptoms. A possible cause of abdominal pain in any person who still has his or her appendix is acute appendicitis. Appendicitis is often hard to diagnose. Normal blood tests, urine tests, ultrasound, and CT scans do not completely rule out early appendicitis or other causes of abdominal pain. Sometimes, only the changes that happen over time will allow appendicitis and other causes of abdominal pain to be determined. Other potential problems that may require surgery may also take time to become more apparent. Because of this, it is important that you follow all of the instructions below. HOME CARE INSTRUCTIONS   Rest as much as possible.  Do not eat solid food until your pain is gone.  While adults or children have pain: A diet of water, weak decaffeinated tea, broth or bouillon, gelatin, oral rehydration solutions (ORS), frozen ice pops, or ice chips may be helpful.  When pain is gone in adults or children: Start a light diet (dry toast, crackers, applesauce, or white rice). Increase the diet slowly as long as it does not bother you. Eat no dairy products (including cheese and eggs) and no spicy, fatty, fried, or high-fiber foods.  Use no alcohol, caffeine, or cigarettes.  Take your regular medicines unless your caregiver told you not to.  Take any prescribed medicine as directed.  Only take over-the-counter or prescription medicines for pain, discomfort, or fever as directed by your caregiver. Do not give aspirin to children. If your caregiver has given you a follow-up appointment, it is very important to keep that appointment. Not keeping the appointment could result in a permanent injury and/or lasting (chronic) pain and/or  disability. If there is any problem keeping the appointment, you must call to reschedule.  SEEK IMMEDIATE MEDICAL CARE IF:   Your pain is not gone in 24 hours.  Your pain becomes worse, changes location, or feels different.  You or your child has an oral temperature above 102 F (38.9 C), not controlled by medicine.  Your baby is older than 3 months with a rectal temperature of 102 F (38.9 C) or higher.  Your baby is 77 months old or younger with a rectal temperature of 100.4 F (38 C) or higher.  You have shaking chills.  You keep throwing up (vomiting) or cannot drink liquids.  There is blood in your vomit or you see blood in your bowel movements.  Your bowel movements become dark or black.  You have frequent bowel movements.  Your bowel movements stop (become blocked) or you cannot pass gas.  You have bloody, frequent, or painful urination.  You have yellow discoloration in the skin or whites of the eyes.  Your stomach becomes bloated or bigger.  You have dizziness or fainting.  You have chest or back pain. MAKE SURE YOU:   Understand these instructions.  Will watch your condition.  Will get help right away if you are not doing well or get worse. Document Released: 08/26/2005 Document Revised: 11/18/2011 Document Reviewed: 07/24/2009 [facility] Patient Information 2013 [place], [place]. Your ultrasound is normal Please continue the care plan you PCP has established

## 2012-11-20 NOTE — ED Provider Notes (Signed)
  Physical Exam  BP 147/92  Pulse 69  Temp(Src) 97.9 F (36.6 C) (Oral)  Resp 18  Ht 5\' 2"  (1.575 m)  Wt 173 lb (78.472 kg)  BMI 31.63 kg/m2  SpO2 100%  Physical Exam Asked to review ultrasound for persistent recurrent R flank pain  ED Course  Procedures  MDM Scan is negative for stone, hydronephrosis  Will DC home with pan control muscle relaxer and follow up with PCP      Arman Filter, NP 11/23/12 2041

## 2012-11-20 NOTE — ED Provider Notes (Signed)
Medical screening examination/treatment/procedure(s) were performed by non-physician practitioner and as supervising physician I was immediately available for consultation/collaboration.   Loren Racer, MD 11/20/12 [phone]

## 2012-11-22 ENCOUNTER — Emergency Department: Payer: Self-pay | Admitting: Emergency Medicine

## 2012-11-22 LAB — URINALYSIS, COMPLETE
Bilirubin,UR: NEGATIVE
Glucose,UR: NEGATIVE mg/dL (ref 0–75)
Ketone: NEGATIVE
Nitrite: NEGATIVE
Ph: 8 (ref 4.5–8.0)
Protein: NEGATIVE
RBC,UR: 5 /HPF (ref 0–5)
Specific Gravity: 1.002 (ref 1.003–1.030)
Squamous Epithelial: 3
WBC UR: 13 /HPF (ref 0–5)

## 2012-11-23 NOTE — ED Provider Notes (Signed)
Medical screening examination/treatment/procedure(s) were performed by non-physician practitioner and as supervising physician I was immediately available for consultation/collaboration.   David Yelverton, MD 11/23/12 2303 

## 2012-12-20 ENCOUNTER — Encounter (HOSPITAL_COMMUNITY): Payer: Self-pay | Admitting: *Deleted

## 2012-12-20 ENCOUNTER — Emergency Department (HOSPITAL_COMMUNITY)
Admission: EM | Admit: 2012-12-20 | Discharge: 2012-12-20 | Disposition: A | Discharge status: Home / Self Care | Payer: Self-pay | Attending: Emergency Medicine | Admitting: Emergency Medicine

## 2012-12-20 ENCOUNTER — Emergency Department (HOSPITAL_COMMUNITY): Payer: Self-pay

## 2012-12-20 DIAGNOSIS — B1920 Unspecified viral hepatitis C without hepatic coma: Secondary | ICD-10-CM | POA: Insufficient documentation

## 2012-12-20 DIAGNOSIS — I10 Essential (primary) hypertension: Secondary | ICD-10-CM | POA: Insufficient documentation

## 2012-12-20 DIAGNOSIS — F17200 Nicotine dependence, unspecified, uncomplicated: Secondary | ICD-10-CM | POA: Insufficient documentation

## 2012-12-20 DIAGNOSIS — N390 Urinary tract infection, site not specified: Secondary | ICD-10-CM

## 2012-12-20 DIAGNOSIS — M5430 Sciatica, unspecified side: Secondary | ICD-10-CM | POA: Insufficient documentation

## 2012-12-20 DIAGNOSIS — R1011 Right upper quadrant pain: Secondary | ICD-10-CM | POA: Insufficient documentation

## 2012-12-20 DIAGNOSIS — R109 Unspecified abdominal pain: Secondary | ICD-10-CM

## 2012-12-20 DIAGNOSIS — Z79899 Other long term (current) drug therapy: Secondary | ICD-10-CM | POA: Insufficient documentation

## 2012-12-20 LAB — COMPREHENSIVE METABOLIC PANEL
ALT: 19 U/L (ref 0–35)
AST: 18 U/L (ref 0–37)
Alkaline Phosphatase: 85 U/L (ref 39–117)
CO2: 28 mEq/L (ref 19–32)
Chloride: 99 mEq/L (ref 96–112)
Creatinine, Ser: 0.6 mg/dL (ref 0.50–1.10)
GFR calc non Af Amer: >90 mL/min (ref >90)
Potassium: 3.5 mEq/L (ref 3.5–5.1)
Sodium: 138 mEq/L (ref 135–145)
Total Bilirubin: 0.2 mg/dL — ABNORMAL LOW (ref 0.3–1.2)

## 2012-12-20 LAB — CBC WITH DIFFERENTIAL/PLATELET
Basophils Absolute: 0 10*3/uL (ref 0.0–0.1)
Eosinophils Absolute: 0.2 10*3/uL (ref 0.0–0.7)
Eosinophils Relative: 2 % (ref 0–5)
HCT: 43.5 % (ref 36.0–46.0)
Lymphocytes Relative: 41 % (ref 12–46)
Lymphs Abs: 4 10*3/uL (ref 0.7–4.0)
MCH: 32 pg (ref 26.0–34.0)
MCV: 93.3 fL (ref 78.0–100.0)
Monocytes Absolute: 1.1 10*3/uL — ABNORMAL HIGH (ref 0.1–1.0)
Platelets: 196 10*3/uL (ref 150–400)
RDW: 13.2 % (ref 11.5–15.5)
WBC: 9.7 10*3/uL (ref 4.0–10.5)

## 2012-12-20 LAB — URINALYSIS, ROUTINE W REFLEX MICROSCOPIC
Bilirubin Urine: NEGATIVE
Glucose, UA: 100 mg/dL — AB
Hgb urine dipstick: NEGATIVE
Ketones, ur: NEGATIVE mg/dL
Protein, ur: NEGATIVE mg/dL
Urobilinogen, UA: 0.2 mg/dL (ref 0.0–1.0)

## 2012-12-20 LAB — URINE MICROSCOPIC-ADD ON

## 2012-12-20 MED ORDER — ONDANSETRON HCL 4 MG/2ML IJ SOLN
4.0000 mg | Freq: Once | INTRAMUSCULAR | Status: AC
  Administered 2012-12-20: 4 mg via INTRAVENOUS
  Filled 2012-12-20: qty 2

## 2012-12-20 MED ORDER — HYDROMORPHONE HCL PF 1 MG/ML IJ SOLN
1.0000 mg | Freq: Once | INTRAMUSCULAR | Status: AC
  Administered 2012-12-20: 1 mg via INTRAVENOUS
  Filled 2012-12-20: qty 1

## 2012-12-20 MED ORDER — MORPHINE SULFATE 4 MG/ML IJ SOLN
4.0000 mg | Freq: Once | INTRAMUSCULAR | Status: AC
  Administered 2012-12-20: 4 mg via INTRAVENOUS
  Filled 2012-12-20: qty 1

## 2012-12-20 MED ORDER — SULFAMETHOXAZOLE-TRIMETHOPRIM 800-160 MG PO TABS: 1.0000 | ORAL_TABLET | Freq: Two times a day (BID) | ORAL | Status: DC

## 2012-12-20 MED ORDER — HYDROCODONE-ACETAMINOPHEN 5-325 MG PO TABS: 2.0000 | ORAL_TABLET | ORAL | Status: DC | PRN

## 2012-12-20 NOTE — ED Provider Notes (Signed)
Medical screening examination/treatment/procedure(s) were performed by non-physician practitioner and as supervising physician I was immediately available for consultation/collaboration.   Rolan Bucco, MD 12/20/12 2218

## 2012-12-20 NOTE — ED Notes (Signed)
Pt states started having RUQ abdominal stabbing pain today, then Friday started having bumps/rash on legs and arms. Denies N/V/D.

## 2012-12-20 NOTE — ED Provider Notes (Signed)
History     CSN: [id number]  Arrival date & time 12/20/12  1615   First MD Initiated Contact with Patient 12/20/12 1651      Chief Complaint  Patient presents with  . Rash  . Abdominal Pain    (Consider location/radiation/quality/duration/timing/severity/associated sxs/prior treatment) Patient is a 47 y.o. female presenting with abdominal pain. The history is provided by the patient. No language interpreter was used.  Abdominal Pain Pain location:  RUQ Pain quality: aching   Pain radiates to:  Does not radiate Pain severity:  Severe   Past Medical History  Diagnosis Date  . Hypertension   . Hepatitis C   . Sciatica     Past Surgical History  Procedure Laterality Date  . Abdominal hysterectomy    . Sinus exploration    . Lithotripsy    . Kidney stone surgery Right 02/12/12  . Kidney stone surgery Left 07/15/2012    No family history on file.  History  Substance Use Topics  . Smoking status: Current Every Day Smoker -- 1.5 years    Types: Cigarettes  . Smokeless tobacco: Never Used  . Alcohol Use: No    OB History   Grav Para Term Preterm Abortions TAB SAB Ect Mult Living                  Review of Systems  Gastrointestinal: Positive for abdominal pain.    Allergies  Aspirin; Codeine; Compazine; Ivp dye; Ketorolac tromethamine; Penicillins; Reglan; Tegretol; Toradol; and Tramadol  Home Medications   Current Outpatient Rx  Name  Route  Sig  Dispense  Refill  . hydrochlorothiazide (HYDRODIURIL) 25 MG tablet   Oral   Take 25 mg by mouth every morning.          Marland Kitchen HYDROcodone-acetaminophen (NORCO/VICODIN) 5-325 MG per tablet   Oral   Take 1 tablet by mouth every 8 (eight) hours as needed for pain.   20 tablet   0   . lisinopril (PRINIVIL,ZESTRIL) 10 MG tablet   Oral   Take 10 mg by mouth every morning.          . promethazine (PHENERGAN) 25 MG tablet   Oral   Take 25 mg by mouth every 6 (six) hours as needed for nausea.         Marland Kitchen  venlafaxine XR (EFFEXOR-XR) 75 MG 24 hr capsule   Oral   Take 75 mg by mouth 2 (two) times daily.           BP 118/70  Pulse 116  Temp(Src) 98.6 F (37 C) (Oral)  Resp 22  SpO2 99%  Physical Exam  Nursing note and vitals reviewed. Constitutional: She is oriented to person, place, and time. She appears well-developed and well-nourished.  HENT:  Head: Normocephalic and atraumatic.  Neck: Normal range of motion. Neck supple.  Cardiovascular: Normal rate and regular rhythm.   Pulmonary/Chest: Effort normal and breath sounds normal.  Abdominal: Soft. Bowel sounds are normal.  ruq tenderness  Musculoskeletal: Normal range of motion.  Neurological: She is alert and oriented to person, place, and time.  Skin: Skin is dry.  Psychiatric: She has a normal mood and affect.    ED Course  Procedures (including critical care time)  Labs Reviewed  COMPREHENSIVE METABOLIC PANEL - Abnormal; Notable for the following:    Glucose, Bld 131 (*)    Total Bilirubin 0.2 (*)    All other components within normal limits  URINALYSIS, ROUTINE W REFLEX MICROSCOPIC -  Abnormal; Notable for the following:    APPearance CLOUDY (*)    Glucose, UA 100 (*)    Leukocytes, UA SMALL (*)    All other components within normal limits  CBC WITH DIFFERENTIAL - Abnormal; Notable for the following:    Monocytes Absolute 1.1 (*)    All other components within normal limits  URINE MICROSCOPIC-ADD ON - Abnormal; Notable for the following:    Squamous Epithelial / LPF MANY (*)    Bacteria, UA FEW (*)    All other components within normal limits  URINE CULTURE   US Abdomen Complete  12/20/2012  *RADIOLOGY REPORT*  Clinical Data:  Right upper quadrant pain.  COMPLETE ABDOMINAL ULTRASOUND  Comparison:  Abdominal ultrasound 11/20/2012 and 10/03/2012.  Findings:  Gallbladder:  No gallstones, gallbladder wall thickening, or pericholecystic fluid.  Common bile duct:  Measures 0.5 cm.  Liver:  No focal lesion identified.   Within normal limits in parenchymal echogenicity.  IVC:  Appears normal.  Pancreas:  No focal abnormality seen.  Spleen:  Measures 4.9 cm and appears normal.  Right Kidney:  Measures 9.3 cm.  Defect in the mid pole is consistent with scar.  No stone or hydronephrosis.  Left Kidney:  Measures 11.3 cm. No stone or hydronephrosis.  1.1 cm cyst is incidentally noted.  A 0.6 cm hyperechoic focus in the mid to lower pole with posterior shadowing is compatible with a nonobstructing stone.  Abdominal aorta:  No aneurysm identified.  IMPRESSION:  1.  Negative for gallstones.  Normal appearing gallbladder. 2.  Likely 0.6 cm nonobstructing stone left kidney.   Original Report Authenticated By: Holley Dexter, M.D.      1. Abdominal pain   2. UTI (lower urinary tract infection)       MDM  Pt is comfortable at this time:will treat for simple ZOX:WRUE send urine for culture:abdomen non tender on discharge        Teressa Lower, NP 12/20/12 1921

## 2012-12-21 LAB — URINE CULTURE: Colony Count: 15000

## 2013-03-14 ENCOUNTER — Emergency Department (HOSPITAL_COMMUNITY)
Admission: EM | Admit: 2013-03-14 | Discharge: 2013-03-14 | Disposition: A | Discharge status: Home / Self Care | Payer: Self-pay | Attending: Emergency Medicine | Admitting: Emergency Medicine

## 2013-03-14 ENCOUNTER — Encounter (HOSPITAL_COMMUNITY): Payer: Self-pay

## 2013-03-14 DIAGNOSIS — Z79899 Other long term (current) drug therapy: Secondary | ICD-10-CM | POA: Insufficient documentation

## 2013-03-14 DIAGNOSIS — Z8739 Personal history of other diseases of the musculoskeletal system and connective tissue: Secondary | ICD-10-CM | POA: Insufficient documentation

## 2013-03-14 DIAGNOSIS — K0889 Other specified disorders of teeth and supporting structures: Secondary | ICD-10-CM

## 2013-03-14 DIAGNOSIS — F17200 Nicotine dependence, unspecified, uncomplicated: Secondary | ICD-10-CM | POA: Insufficient documentation

## 2013-03-14 DIAGNOSIS — Z8619 Personal history of other infectious and parasitic diseases: Secondary | ICD-10-CM | POA: Insufficient documentation

## 2013-03-14 DIAGNOSIS — K029 Dental caries, unspecified: Secondary | ICD-10-CM | POA: Insufficient documentation

## 2013-03-14 DIAGNOSIS — I10 Essential (primary) hypertension: Secondary | ICD-10-CM | POA: Insufficient documentation

## 2013-03-14 DIAGNOSIS — Z880 Allergy status to penicillin: Secondary | ICD-10-CM | POA: Insufficient documentation

## 2013-03-14 MED ORDER — ERYTHROMYCIN BASE 250 MG PO TABS: 250.0000 mg | ORAL_TABLET | Freq: Four times a day (QID) | ORAL | Status: DC

## 2013-03-14 MED ORDER — HYDROCODONE-ACETAMINOPHEN 5-325 MG PO TABS: ORAL_TABLET | ORAL | Status: DC

## 2013-03-14 NOTE — ED Provider Notes (Signed)
History    CSN: [id number] Arrival date & time 03/14/13  1247  First MD Initiated Contact with Patient 03/14/13 1303     Chief Complaint  Patient presents with  . dental problems     HPI Pt was seen at 1305.  Per pt, c/o gradual onset and persistence of constant right upper tooth "pain" for the past 3 days.  Denies fevers, no intra-oral edema, no rash, no facial swelling, no dysphagia, no neck pain.   The condition is aggravated by nothing. The condition is relieved by nothing. The symptoms have been associated with no other complaints.   Past Medical History  Diagnosis Date  . Hypertension   . Hepatitis C   . Sciatica    Past Surgical History  Procedure Laterality Date  . Abdominal hysterectomy    . Sinus exploration    . Lithotripsy    . Kidney stone surgery Right 02/12/12  . Kidney stone surgery Left 07/15/2012  . Oophorectomy     Family History  Problem Relation Age of Onset  . Heart failure Mother   . Diabetes Father   . Cancer Sister    History  Substance Use Topics  . Smoking status: Current Every Day Smoker -- 0.50 packs/day for 1.5 years    Types: Cigarettes  . Smokeless tobacco: Never Used  . Alcohol Use: No    Review of Systems ROS: Statement: All systems negative except as marked or noted in the HPI; Constitutional: Negative for fever and chills. ; ; Eyes: Negative for eye pain and discharge. ; ; ENMT: Positive for dental caries, dental hygiene poor and toothache. Negative for ear pain, bleeding gums, dental injury, facial deformity, facial swelling, hoarseness, nasal congestion, sinus pressure, sore throat, throat swelling and tongue swollen. ; ; Cardiovascular: Negative for chest pain, palpitations, diaphoresis, dyspnea and peripheral edema. ; ; Respiratory: Negative for cough, wheezing and stridor. ; ; Gastrointestinal: Negative for nausea, vomiting, diarrhea and abdominal pain. ; ; Genitourinary: Negative for dysuria, flank pain and hematuria. ; ;  Musculoskeletal: Negative for back pain and neck pain. ; ; Skin: Negative for rash and skin lesion. ; ; Neuro: Negative for headache, lightheadedness and neck stiffness. ;    Allergies  Aspirin; Codeine; Compazine; Ivp dye; Ketorolac tromethamine; Penicillins; Reglan; Tegretol; Toradol; and Tramadol  Home Medications   Current Outpatient Rx  Name  Route  Sig  Dispense  Refill  . erythromycin (E-MYCIN) 250 MG tablet   Oral   Take 1 tablet (250 mg total) by mouth every 6 (six) hours.   28 tablet   0   . hydrochlorothiazide (HYDRODIURIL) 25 MG tablet   Oral   Take 25 mg by mouth every morning.          Marland Kitchen HYDROcodone-acetaminophen (NORCO/VICODIN) 5-325 MG per tablet   Oral   Take 1 tablet by mouth every 8 (eight) hours as needed for pain.   20 tablet   0   . HYDROcodone-acetaminophen (NORCO/VICODIN) 5-325 MG per tablet   Oral   Take 2 tablets by mouth every 4 (four) hours as needed for pain.   5 tablet   0   . HYDROcodone-acetaminophen (NORCO/VICODIN) 5-325 MG per tablet      1 or 2 tabs PO q6 hours prn pain   20 tablet   0   . lisinopril (PRINIVIL,ZESTRIL) 10 MG tablet   Oral   Take 10 mg by mouth every morning.          Marland Kitchen  promethazine (PHENERGAN) 25 MG tablet   Oral   Take 25 mg by mouth every 6 (six) hours as needed for nausea.         Marland Kitchen sulfamethoxazole-trimethoprim (SEPTRA DS) 800-160 MG per tablet   Oral   Take 1 tablet by mouth every 12 (twelve) hours.   6 tablet   0   . venlafaxine XR (EFFEXOR-XR) 75 MG 24 hr capsule   Oral   Take 75 mg by mouth 2 (two) times daily.          BP 124/80  Pulse 83  Temp(Src) 98.5 F (36.9 C) (Oral)  Resp 18  Ht 5\' 3"  (1.6 m)  Wt 184 lb (83.462 kg)  BMI 32.6 kg/m2  SpO2 99% Physical Exam 1310: Physical examination: Vital signs and O2 SAT: Reviewed; Constitutional: Well developed, Well nourished, Well hydrated, In no acute distress; Head and Face: Normocephalic, Atraumatic; Eyes: EOMI, PERRL, No scleral  icterus; ENMT: Mouth and pharynx normal, Poor dentition, Widespread dental decay, Left TM normal, Right TM normal, Mucous membranes moist, +upper right 2nd premolar with dental decay.  No gingival erythema, edema, fluctuance, or drainage.  No intra-oral edema. No submandibular or sublingual edema. No hoarse voice, no drooling, no stridor.  ; Neck: Supple, Full range of motion, No lymphadenopathy; Cardiovascular: Regular rate and rhythm, No murmur, rub, or gallop; Respiratory: Breath sounds clear & equal bilaterally, No rales, rhonchi, wheezes, Normal respiratory effort/excursion; Chest: Nontender, Movement normal; Extremities: Pulses normal, No tenderness, No edema; Neuro: AA&Ox3, Major CN grossly intact.  No gross focal motor or sensory deficits in extremities.; Skin: Color normal, No rash, No petechiae, Warm, Dry   ED Course  Procedures     MDM  MDM Reviewed: previous chart, nursing note and vitals   1315:  Pt encouraged to f/u with dentist or oral surgeon for her dental needs for good continuity of care and definitive treatment.  Verb understanding.    Laray Anger, DO 03/15/13 [phone]

## 2013-03-14 NOTE — ED Notes (Signed)
Patient c/o right upper tooth pain and right jaw pain with slight swelling. Right upper tooth is loose.

## 2013-05-20 DIAGNOSIS — M47817 Spondylosis without myelopathy or radiculopathy, lumbosacral region: Secondary | ICD-10-CM | POA: Insufficient documentation

## 2013-06-26 ENCOUNTER — Emergency Department (HOSPITAL_COMMUNITY)
Admission: EM | Admit: 2013-06-26 | Discharge: 2013-06-26 | Disposition: A | Discharge status: Home / Self Care | Payer: Self-pay | Attending: Emergency Medicine | Admitting: Emergency Medicine

## 2013-06-26 ENCOUNTER — Emergency Department (HOSPITAL_COMMUNITY): Payer: Self-pay

## 2013-06-26 ENCOUNTER — Encounter (HOSPITAL_COMMUNITY): Payer: Self-pay | Admitting: Emergency Medicine

## 2013-06-26 DIAGNOSIS — Z79899 Other long term (current) drug therapy: Secondary | ICD-10-CM | POA: Insufficient documentation

## 2013-06-26 DIAGNOSIS — R109 Unspecified abdominal pain: Secondary | ICD-10-CM | POA: Insufficient documentation

## 2013-06-26 DIAGNOSIS — Z880 Allergy status to penicillin: Secondary | ICD-10-CM | POA: Insufficient documentation

## 2013-06-26 DIAGNOSIS — Z9889 Other specified postprocedural states: Secondary | ICD-10-CM | POA: Insufficient documentation

## 2013-06-26 DIAGNOSIS — Z9079 Acquired absence of other genital organ(s): Secondary | ICD-10-CM | POA: Insufficient documentation

## 2013-06-26 DIAGNOSIS — M549 Dorsalgia, unspecified: Secondary | ICD-10-CM | POA: Insufficient documentation

## 2013-06-26 DIAGNOSIS — Z8619 Personal history of other infectious and parasitic diseases: Secondary | ICD-10-CM | POA: Insufficient documentation

## 2013-06-26 DIAGNOSIS — N390 Urinary tract infection, site not specified: Secondary | ICD-10-CM | POA: Insufficient documentation

## 2013-06-26 DIAGNOSIS — I10 Essential (primary) hypertension: Secondary | ICD-10-CM | POA: Insufficient documentation

## 2013-06-26 DIAGNOSIS — F17200 Nicotine dependence, unspecified, uncomplicated: Secondary | ICD-10-CM | POA: Insufficient documentation

## 2013-06-26 DIAGNOSIS — Z90710 Acquired absence of both cervix and uterus: Secondary | ICD-10-CM | POA: Insufficient documentation

## 2013-06-26 LAB — CBC WITH DIFFERENTIAL/PLATELET
Basophils Absolute: 0 10*3/uL (ref 0.0–0.1)
Basophils Relative: 0 % (ref 0–1)
Eosinophils Absolute: 0.1 10*3/uL (ref 0.0–0.7)
Eosinophils Relative: 0 % (ref 0–5)
HCT: 42.9 % (ref 36.0–46.0)
Hemoglobin: 14.9 g/dL (ref 12.0–15.0)
Lymphocytes Relative: 36 % (ref 12–46)
Lymphs Abs: 4.1 10*3/uL — ABNORMAL HIGH (ref 0.7–4.0)
MCH: 32.3 pg (ref 26.0–34.0)
MCHC: 34.7 g/dL (ref 30.0–36.0)
MCV: 92.9 fL (ref 78.0–100.0)
Monocytes Absolute: 0.7 10*3/uL (ref 0.1–1.0)
Monocytes Relative: 7 % (ref 3–12)
Neutro Abs: 6.5 10*3/uL (ref 1.7–7.7)
Neutrophils Relative %: 57 % (ref 43–77)
Platelets: 222 10*3/uL (ref 150–400)
RBC: 4.62 MIL/uL (ref 3.87–5.11)
RDW: 12.6 % (ref 11.5–15.5)
WBC: 11.4 10*3/uL — ABNORMAL HIGH (ref 4.0–10.5)

## 2013-06-26 LAB — BASIC METABOLIC PANEL
BUN: 16 mg/dL (ref 6–23)
CO2: 24 mEq/L (ref 19–32)
Calcium: 9.7 mg/dL (ref 8.4–10.5)
Chloride: 101 mEq/L (ref 96–112)
Creatinine, Ser: 0.6 mg/dL (ref 0.50–1.10)
GFR calc Af Amer: >90 mL/min (ref >90)
GFR calc non Af Amer: >90 mL/min (ref >90)
Glucose, Bld: 206 mg/dL — ABNORMAL HIGH (ref 70–99)
Potassium: 3.4 mEq/L — ABNORMAL LOW (ref 3.5–5.1)
Sodium: 138 mEq/L (ref 135–145)

## 2013-06-26 LAB — URINALYSIS, ROUTINE W REFLEX MICROSCOPIC
Bilirubin Urine: NEGATIVE
Glucose, UA: 100 mg/dL — AB
Hgb urine dipstick: NEGATIVE
Ketones, ur: NEGATIVE mg/dL
Nitrite: NEGATIVE
Protein, ur: NEGATIVE mg/dL
Specific Gravity, Urine: 1.015 (ref 1.005–1.030)
Urobilinogen, UA: 0.2 mg/dL (ref 0.0–1.0)
pH: 6.5 (ref 5.0–8.0)

## 2013-06-26 LAB — URINE MICROSCOPIC-ADD ON

## 2013-06-26 MED ORDER — CIPROFLOXACIN HCL 500 MG PO TABS: 500.0000 mg | ORAL_TABLET | Freq: Two times a day (BID) | ORAL | Status: DC

## 2013-06-26 MED ORDER — CIPROFLOXACIN IN D5W 400 MG/200ML IV SOLN
400.0000 mg | Freq: Once | INTRAVENOUS | Status: AC
  Administered 2013-06-26: 400 mg via INTRAVENOUS
  Filled 2013-06-26 (×2): qty 200

## 2013-06-26 MED ORDER — IBUPROFEN 200 MG PO TABS
600.0000 mg | ORAL_TABLET | Freq: Once | ORAL | Status: DC
  Filled 2013-06-26: qty 3

## 2013-06-26 MED ORDER — SODIUM CHLORIDE 0.9 % IV BOLUS (SEPSIS)
1000.0000 mL | Freq: Once | INTRAVENOUS | Status: AC
  Administered 2013-06-26: 1000 mL via INTRAVENOUS

## 2013-06-26 MED ORDER — ONDANSETRON HCL 4 MG/2ML IJ SOLN
4.0000 mg | Freq: Once | INTRAMUSCULAR | Status: AC
  Administered 2013-06-26: 4 mg via INTRAVENOUS
  Filled 2013-06-26: qty 2

## 2013-06-26 MED ORDER — OXYCODONE-ACETAMINOPHEN 5-325 MG PO TABS: 1.0000 | ORAL_TABLET | ORAL | Status: DC | PRN

## 2013-06-26 MED ORDER — HYDROMORPHONE HCL PF 1 MG/ML IJ SOLN
1.0000 mg | Freq: Once | INTRAMUSCULAR | Status: AC
  Administered 2013-06-26: 1 mg via INTRAVENOUS
  Filled 2013-06-26: qty 1

## 2013-06-26 NOTE — ED Provider Notes (Signed)
CSN: [id number]     Arrival date & time 06/26/13  1324 History   First MD Initiated Contact with Patient 06/26/13 1353     Chief Complaint  Patient presents with  . Flank Pain   (Consider location/radiation/quality/duration/timing/severity/associated sxs/prior Treatment) HPI  47 year old female with left flank and left mid back pain. Gradual onset 2 days ago. First noticed while sweeping the floor, but doesn't think she strained herself or related to this. Pain is waxed and waned  but does not completely go away. Achy with occasional sharper pain. Patient reports a past history of multiple prior kidney stones and says her current symptoms feel similar. No urinary complaints. Nausea, no vomiting. Patient has been taking ibuprofen with only minimal relief. No fever or chills.   Past Medical History  Diagnosis Date  . Hypertension   . Hepatitis C   . Sciatica    Past Surgical History  Procedure Laterality Date  . Abdominal hysterectomy    . Sinus exploration    . Lithotripsy    . Kidney stone surgery Right 02/12/12  . Kidney stone surgery Left 07/15/2012  . Oophorectomy     Family History  Problem Relation Age of Onset  . Heart failure Mother   . Diabetes Father   . Cancer Sister    History  Substance Use Topics  . Smoking status: Current Every Day Smoker -- 0.50 packs/day for 1.5 years    Types: Cigarettes  . Smokeless tobacco: Never Used  . Alcohol Use: No   OB History   Grav Para Term Preterm Abortions TAB SAB Ect Mult Living                 Review of Systems  All systems reviewed and negative, other than as noted in HPI.   Allergies  Aspirin; Codeine; Compazine; Ivp dye; Ketorolac tromethamine; Norco; Penicillins; Reglan; Tegretol; Toradol; and Tramadol  Home Medications   Current Outpatient Rx  Name  Route  Sig  Dispense  Refill  . FLUoxetine (PROZAC) 40 MG capsule   Oral   Take 40 mg by mouth daily.         . hydrochlorothiazide (HYDRODIURIL) 25 MG  tablet   Oral   Take 25 mg by mouth every morning.          Marland Kitchen omeprazole (PRILOSEC) 40 MG capsule   Oral   Take 40 mg by mouth daily.         . pregabalin (LYRICA) 75 MG capsule   Oral   Take 75 mg by mouth 2 (two) times daily.         . promethazine (PHENERGAN) 25 MG tablet   Oral   Take 25 mg by mouth every 6 (six) hours as needed for nausea.         . simvastatin (ZOCOR) 10 MG tablet   Oral   Take 10 mg by mouth daily.         Marland Kitchen zolpidem (AMBIEN CR) 12.5 MG CR tablet   Oral   Take 12.5 mg by mouth at bedtime as needed for sleep.          BP 163/97  Pulse 97  Temp(Src) 97.7 F (36.5 C) (Oral)  Resp 18  SpO2 97% Physical Exam  Nursing note and vitals reviewed. Constitutional: She appears well-developed and well-nourished. No distress.  HENT:  Head: Normocephalic and atraumatic.  Eyes: Conjunctivae are normal. Right eye exhibits no discharge. Left eye exhibits no discharge.  Neck: Neck supple.  Cardiovascular: Normal rate, regular rhythm and normal heart sounds.  Exam reveals no gallop and no friction rub.   No murmur heard. Pulmonary/Chest: Effort normal and breath sounds normal. No respiratory distress.  Abdominal: Soft. She exhibits no distension. There is tenderness. There is no rebound and no guarding.  Mild L abdominal tenderness w/o rebound or guarding  Genitourinary:  No cva tenderness  Musculoskeletal: She exhibits no edema and no tenderness.  Neurological: She is alert.  Skin: Skin is warm and dry.  Psychiatric: She has a normal mood and affect. Her behavior is normal. Thought content normal.    ED Course  Procedures (including critical care time) Labs Review Labs Reviewed  CBC WITH DIFFERENTIAL - Abnormal; Notable for the following:    WBC 11.4 (*)    Lymphs Abs 4.1 (*)    All other components within normal limits  BASIC METABOLIC PANEL - Abnormal; Notable for the following:    Potassium 3.4 (*)    Glucose, Bld 206 (*)    All other  components within normal limits  URINALYSIS, ROUTINE W REFLEX MICROSCOPIC   Imaging Review Ct Abdomen Pelvis Wo Contrast  06/26/2013   CLINICAL DATA:  Left-sided abdominal pain for 5 days. History of kidney stones.  EXAM: CT ABDOMEN AND PELVIS WITHOUT CONTRAST  TECHNIQUE: Multidetector CT imaging of the abdomen and pelvis was performed following the standard protocol without intravenous contrast.  COMPARISON:  12/25/2011  FINDINGS: No ureteral stones or signs of obstruction.  There is right renal scarring with parenchymal calcifications. Small nonobstructing intrarenal stones are noted bilaterally. Normal ureters. Normal bladder.  The uterus is surgically absent. There are no pelvic masses.  Lung bases are essentially clear. The heart is normal in size.  Fatty infiltration of the liver. The liver is otherwise unremarkable.  Normal spleen, gallbladder and pancreas. No bile duct dilation. There are no adrenal masses. No adenopathy. No abnormal fluid collections.  There are sigmoid colon diverticula. No diverticulitis. The colon is otherwise unremarkable. Normal small bowel. Normal appendix.  There are degenerative changes most evident at L5-S1. No osteoblastic or osteolytic lesions.  IMPRESSION: 1. No acute findings. No ureteral stone or sign of obstruction. There are no findings to explain left-sided pain. 2. Right renal scarring. Right renal parenchymal calcifications. Small bilateral nonobstructing intrarenal stones. 3. Sigmoid colon diverticula. No diverticulitis. 4. Status post hysterectomy. Degenerative changes most evident at L5-S1. No other abnormalities.   Electronically Signed   By: Amie Portland M.D.   On: 06/26/2013 14:49    EKG Interpretation   None       MDM   1. UTI (urinary tract infection)   2. Flank pain    47yF with flank pain. CT w/o acute abnormality. Questionable UTI based on UA. Given symptoms, will tx.     Raeford Razor, MD 06/30/13 2321

## 2013-06-26 NOTE — ED Notes (Signed)
Pt states back pain for past two days, suffers from kidney stones, dr advised to come to the ED.

## 2013-06-26 NOTE — ED Notes (Signed)
Pt w/ hx of kidney stones sent here by her doctor for same because she was not able to get in to see her doctor.  Pain and vomiting x 2 days.

## 2013-06-26 NOTE — ED Notes (Signed)
Pt to be discharged after cipro infusion complete

## 2013-06-26 NOTE — ED Notes (Signed)
Pt refused ibuprofen for pain, sts only dilaudid helps with her pain. Dr Juleen China aware, no new orders received

## 2013-06-27 LAB — URINE CULTURE: Colony Count: >=100000

## 2013-07-30 ENCOUNTER — Emergency Department (HOSPITAL_COMMUNITY): Payer: Self-pay

## 2013-07-30 ENCOUNTER — Emergency Department (HOSPITAL_COMMUNITY)
Admission: EM | Admit: 2013-07-30 | Discharge: 2013-07-30 | Disposition: A | Discharge status: Home / Self Care | Payer: Self-pay | Attending: Emergency Medicine | Admitting: Emergency Medicine

## 2013-07-30 DIAGNOSIS — S59909A Unspecified injury of unspecified elbow, initial encounter: Secondary | ICD-10-CM | POA: Insufficient documentation

## 2013-07-30 DIAGNOSIS — F17200 Nicotine dependence, unspecified, uncomplicated: Secondary | ICD-10-CM | POA: Insufficient documentation

## 2013-07-30 DIAGNOSIS — Y929 Unspecified place or not applicable: Secondary | ICD-10-CM | POA: Insufficient documentation

## 2013-07-30 DIAGNOSIS — M25532 Pain in left wrist: Secondary | ICD-10-CM

## 2013-07-30 DIAGNOSIS — I10 Essential (primary) hypertension: Secondary | ICD-10-CM | POA: Insufficient documentation

## 2013-07-30 DIAGNOSIS — Z880 Allergy status to penicillin: Secondary | ICD-10-CM | POA: Insufficient documentation

## 2013-07-30 DIAGNOSIS — S6990XA Unspecified injury of unspecified wrist, hand and finger(s), initial encounter: Secondary | ICD-10-CM | POA: Insufficient documentation

## 2013-07-30 DIAGNOSIS — Y9389 Activity, other specified: Secondary | ICD-10-CM | POA: Insufficient documentation

## 2013-07-30 DIAGNOSIS — Z79899 Other long term (current) drug therapy: Secondary | ICD-10-CM | POA: Insufficient documentation

## 2013-07-30 DIAGNOSIS — B1920 Unspecified viral hepatitis C without hepatic coma: Secondary | ICD-10-CM | POA: Insufficient documentation

## 2013-07-30 DIAGNOSIS — W010XXA Fall on same level from slipping, tripping and stumbling without subsequent striking against object, initial encounter: Secondary | ICD-10-CM | POA: Insufficient documentation

## 2013-07-30 MED ORDER — ONDANSETRON 8 MG PO TBDP
8.0000 mg | ORAL_TABLET | Freq: Once | ORAL | Status: AC
  Administered 2013-07-30: 8 mg via ORAL
  Filled 2013-07-30: qty 1

## 2013-07-30 MED ORDER — HYDROCODONE-ACETAMINOPHEN 5-325 MG PO TABS
2.0000 | ORAL_TABLET | Freq: Once | ORAL | Status: AC
  Administered 2013-07-30: 2 via ORAL

## 2013-07-30 NOTE — ED Notes (Signed)
Pt states that she was walking her dogs and got tangled up and injured her L wrist/hand. Pt states she fell also and has pain to the L side of her lower back. Pt states the pain increases when she moves her L thumb.

## 2013-07-30 NOTE — ED Notes (Signed)
Pt has a ride home.  

## 2013-07-30 NOTE — ED Provider Notes (Signed)
CSN: [id number]     Arrival date & time 07/30/13  1538 History  This chart was scribed for Junious Silk, PA working with Suzi Roots, MD by Quintella Reichert, ED Scribe. This patient was seen in room WTR5/WTR5 and the patient's care was started at 5:24 PM.   Chief Complaint  Patient presents with  . Arm Pain    The history is provided by the patient. No language interpreter was used.    HPI Comments: Stephanie [name] is a 47 y.o. female who presents to the Emergency Department complaining of a left arm injury sustained 3-4 days ago.  Pt states that she was walking her dogs and got tangled up and fell and landed on her outstretched left hand.  She denies head impact or LOC.  Since then she has had constant moderate pain to the left forearm described as "like something is stabbing in my bone."  Pain is worsened by moving her fingers.  She has attempted to treat pain with 4 200mg  ibuprofen with some temporary relief.  Pt is ambidextrous but uses her right hand slightly more than the left.   Past Medical History  Diagnosis Date  . Hypertension   . Hepatitis C   . Sciatica     Past Surgical History  Procedure Laterality Date  . Abdominal hysterectomy    . Sinus exploration    . Lithotripsy    . Kidney stone surgery Right 02/12/12  . Kidney stone surgery Left 07/15/2012  . Oophorectomy      Family History  Problem Relation Age of Onset  . Heart failure Mother   . Diabetes Father   . Cancer Sister     History  Substance Use Topics  . Smoking status: Current Every Day Smoker -- 0.50 packs/day for 1.5 years    Types: Cigarettes  . Smokeless tobacco: Never Used  . Alcohol Use: No    OB History   Grav Para Term Preterm Abortions TAB SAB Ect Mult Living                  Review of Systems  Musculoskeletal: Positive for arthralgias.  Neurological: Negative for weakness and numbness.  All other systems reviewed and are negative.     Allergies  Aspirin; Codeine;  Compazine; Ivp dye; Ketorolac tromethamine; Norco; Penicillins; Reglan; Tegretol; Toradol; and Tramadol  Home Medications   Current Outpatient Rx  Name  Route  Sig  Dispense  Refill  . FLUoxetine (PROZAC) 40 MG capsule   Oral   Take 40 mg by mouth daily.         . hydrochlorothiazide (HYDRODIURIL) 25 MG tablet   Oral   Take 25 mg by mouth every morning.          Marland Kitchen ibuprofen (ADVIL,MOTRIN) 200 MG tablet   Oral   Take 800 mg by mouth every 6 (six) hours as needed (pain).         Marland Kitchen omeprazole (PRILOSEC) 40 MG capsule   Oral   Take 40 mg by mouth daily.         . pregabalin (LYRICA) 75 MG capsule   Oral   Take 75 mg by mouth 2 (two) times daily.         . promethazine (PHENERGAN) 25 MG tablet   Oral   Take 25 mg by mouth every 6 (six) hours as needed for nausea.         Marland Kitchen zolpidem (AMBIEN CR) 12.5 MG CR tablet  Oral   Take 12.5 mg by mouth at bedtime as needed for sleep.          BP 126/84  Pulse 102  Temp(Src) 97.9 F (36.6 C) (Oral)  Resp 16  SpO2 96%  Physical Exam  Nursing note and vitals reviewed. Constitutional: She is oriented to person, place, and time. She appears well-developed and well-nourished. No distress.  HENT:  Head: Normocephalic and atraumatic.  Right Ear: External ear normal.  Left Ear: External ear normal.  Nose: Nose normal.  Mouth/Throat: Oropharynx is clear and moist.  Eyes: Conjunctivae are normal.  Neck: Normal range of motion.  Cardiovascular: Normal rate, regular rhythm and normal heart sounds.   Pulmonary/Chest: Effort normal and breath sounds normal. No stridor. No respiratory distress. She has no wheezes. She has no rales.  Abdominal: Soft. She exhibits no distension.  Musculoskeletal:  Tender to palpation diffusely over left thumb and wrist with maximal tenderness at distal radius.  ROM limited due to pain.  Neurovascularly intact.  Compartment soft.  Cap refill <3 seconds.  Neurological: She is alert and oriented to  person, place, and time. She has normal strength.  Skin: Skin is warm and dry. She is not diaphoretic. No erythema.  Psychiatric: She has a normal mood and affect. Her behavior is normal.    ED Course  Procedures (including critical care time)  DIAGNOSTIC STUDIES: Oxygen Saturation is 96% on room air, normal by my interpretation.    COORDINATION OF CARE: 5:29 PM: Discussed treatment plan which includes splint application, pain medication and RICE treatment.  Pt expressed understanding and agreed to plan.   Labs Review Labs Reviewed - No data to display   Imaging Review Dg Wrist Complete Left  07/30/2013   CLINICAL DATA:  Larey Seat catching self with left hand, left hand and wrist pain  EXAM: LEFT WRIST - COMPLETE 3+ VIEW  COMPARISON:  None  FINDINGS: Osseous mineralization grossly normal for technique.  Joint spaces preserved.  No acute fracture, dislocation or bone destruction.  IMPRESSION: No acute osseous abnormalities.   Electronically Signed   By: Ulyses Southward M.D.   On: 07/30/2013 17:04   Dg Hand Complete Left  07/30/2013   CLINICAL DATA:  Larey Seat catching self with left hand, left wrist and hand pain  EXAM: LEFT HAND - COMPLETE 3+ VIEW  COMPARISON:  None  FINDINGS: Osseous mineralization grossly normal for technique.  Joint spaces preserved.  No acute fracture, dislocation, or bone destruction.  IMPRESSION: No acute abnormalities.   Electronically Signed   By: Ulyses Southward M.D.   On: 07/30/2013 17:04    EKG Interpretation   None       MDM   1. Left wrist pain    She presents with left wrist pain after a fall. X-rays are all normal. She is neurovascularly intact and the compartment is soft. I placed her in a thumb spica splint for comfort. Discussed rest, ice, elevation. She was given orthopedic followup. Return instructions given. Vital signs stable for discharge. Patient / Family / Caregiver informed of clinical course, understand medical decision-making process, and agree with  plan.   I personally performed the services described in this documentation, which was scribed in my presence. The recorded information has been reviewed and is accurate.    Mora Bellman, PA-C 07/30/13 [phone]

## 2013-07-31 NOTE — ED Provider Notes (Signed)
Medical screening examination/treatment/procedure(s) were performed by non-physician practitioner and as supervising physician I was immediately available for consultation/collaboration.  EKG Interpretation   None         Suzi Roots, MD 07/31/13 1800

## 2013-08-21 ENCOUNTER — Encounter (HOSPITAL_COMMUNITY): Payer: Self-pay | Admitting: Emergency Medicine

## 2013-08-21 ENCOUNTER — Emergency Department (HOSPITAL_COMMUNITY): Payer: Self-pay

## 2013-08-21 ENCOUNTER — Emergency Department (HOSPITAL_COMMUNITY)
Admission: EM | Admit: 2013-08-21 | Discharge: 2013-08-21 | Disposition: A | Discharge status: Home / Self Care | Payer: Self-pay | Attending: Emergency Medicine | Admitting: Emergency Medicine

## 2013-08-21 DIAGNOSIS — R109 Unspecified abdominal pain: Secondary | ICD-10-CM

## 2013-08-21 DIAGNOSIS — Z8619 Personal history of other infectious and parasitic diseases: Secondary | ICD-10-CM | POA: Insufficient documentation

## 2013-08-21 DIAGNOSIS — R1011 Right upper quadrant pain: Secondary | ICD-10-CM | POA: Insufficient documentation

## 2013-08-21 DIAGNOSIS — Z90710 Acquired absence of both cervix and uterus: Secondary | ICD-10-CM | POA: Insufficient documentation

## 2013-08-21 DIAGNOSIS — R197 Diarrhea, unspecified: Secondary | ICD-10-CM | POA: Insufficient documentation

## 2013-08-21 DIAGNOSIS — Z79899 Other long term (current) drug therapy: Secondary | ICD-10-CM | POA: Insufficient documentation

## 2013-08-21 DIAGNOSIS — R350 Frequency of micturition: Secondary | ICD-10-CM | POA: Insufficient documentation

## 2013-08-21 DIAGNOSIS — R3915 Urgency of urination: Secondary | ICD-10-CM | POA: Insufficient documentation

## 2013-08-21 DIAGNOSIS — Z880 Allergy status to penicillin: Secondary | ICD-10-CM | POA: Insufficient documentation

## 2013-08-21 DIAGNOSIS — F17200 Nicotine dependence, unspecified, uncomplicated: Secondary | ICD-10-CM | POA: Insufficient documentation

## 2013-08-21 DIAGNOSIS — R112 Nausea with vomiting, unspecified: Secondary | ICD-10-CM | POA: Insufficient documentation

## 2013-08-21 DIAGNOSIS — Z9079 Acquired absence of other genital organ(s): Secondary | ICD-10-CM | POA: Insufficient documentation

## 2013-08-21 DIAGNOSIS — I10 Essential (primary) hypertension: Secondary | ICD-10-CM | POA: Insufficient documentation

## 2013-08-21 DIAGNOSIS — Z792 Long term (current) use of antibiotics: Secondary | ICD-10-CM | POA: Insufficient documentation

## 2013-08-21 DIAGNOSIS — R1031 Right lower quadrant pain: Secondary | ICD-10-CM | POA: Insufficient documentation

## 2013-08-21 DIAGNOSIS — M549 Dorsalgia, unspecified: Secondary | ICD-10-CM | POA: Insufficient documentation

## 2013-08-21 LAB — COMPREHENSIVE METABOLIC PANEL
ALT: 22 U/L (ref 0–35)
Alkaline Phosphatase: 97 U/L (ref 39–117)
CO2: 25 mEq/L (ref 19–32)
Calcium: 10 mg/dL (ref 8.4–10.5)
Chloride: 102 mEq/L (ref 96–112)
GFR calc Af Amer: >90 mL/min (ref >90)
GFR calc non Af Amer: >90 mL/min (ref >90)
Glucose, Bld: 109 mg/dL — ABNORMAL HIGH (ref 70–99)
Potassium: 3.7 mEq/L (ref 3.5–5.1)
Sodium: 140 mEq/L (ref 135–145)

## 2013-08-21 LAB — URINALYSIS, ROUTINE W REFLEX MICROSCOPIC
Bilirubin Urine: NEGATIVE
Glucose, UA: NEGATIVE mg/dL
Hgb urine dipstick: NEGATIVE
Nitrite: NEGATIVE
Protein, ur: NEGATIVE mg/dL
Specific Gravity, Urine: 1.004 — ABNORMAL LOW (ref 1.005–1.030)
Urobilinogen, UA: 0.2 mg/dL (ref 0.0–1.0)
pH: 6 (ref 5.0–8.0)

## 2013-08-21 LAB — CBC WITH DIFFERENTIAL/PLATELET
Eosinophils Relative: 1 % (ref 0–5)
Hemoglobin: 15.2 g/dL — ABNORMAL HIGH (ref 12.0–15.0)
Lymphocytes Relative: 43 % (ref 12–46)
Lymphs Abs: 4.6 10*3/uL — ABNORMAL HIGH (ref 0.7–4.0)
MCV: 92.4 fL (ref 78.0–100.0)
Monocytes Relative: 8 % (ref 3–12)
Neutro Abs: 5 10*3/uL (ref 1.7–7.7)
Platelets: 190 10*3/uL (ref 150–400)
RBC: 4.84 MIL/uL (ref 3.87–5.11)
RDW: 12.8 % (ref 11.5–15.5)
WBC: 10.6 10*3/uL — ABNORMAL HIGH (ref 4.0–10.5)

## 2013-08-21 MED ORDER — CIPROFLOXACIN HCL 250 MG PO TABS: 250.0000 mg | ORAL_TABLET | Freq: Two times a day (BID) | ORAL | Status: DC

## 2013-08-21 MED ORDER — HYDROMORPHONE HCL PF 1 MG/ML IJ SOLN
1.0000 mg | Freq: Once | INTRAMUSCULAR | Status: AC
  Administered 2013-08-21: 1 mg via INTRAVENOUS
  Filled 2013-08-21: qty 1

## 2013-08-21 MED ORDER — HYDROCODONE-ACETAMINOPHEN 5-325 MG PO TABS: 15.0000 | ORAL_TABLET | ORAL | Status: DC | PRN

## 2013-08-21 MED ORDER — SODIUM CHLORIDE 0.9 % IV BOLUS (SEPSIS)
1000.0000 mL | Freq: Once | INTRAVENOUS | Status: AC
  Administered 2013-08-21: 1000 mL via INTRAVENOUS

## 2013-08-21 MED ORDER — ONDANSETRON HCL 4 MG/2ML IJ SOLN
4.0000 mg | Freq: Once | INTRAMUSCULAR | Status: AC
  Administered 2013-08-21: 4 mg via INTRAVENOUS
  Filled 2013-08-21: qty 2

## 2013-08-21 MED ORDER — ONDANSETRON HCL 4 MG PO TABS: 4.0000 mg | ORAL_TABLET | Freq: Four times a day (QID) | ORAL | Status: DC

## 2013-08-21 NOTE — ED Provider Notes (Signed)
CSN: [id number]     Arrival date & time 08/21/13  1112 History   First MD Initiated Contact with Patient 08/21/13 1122     Chief Complaint  Patient presents with  . Abdominal Pain  . Emesis  . Back Pain   (Consider location/radiation/quality/duration/timing/severity/associated sxs/prior Treatment) Patient is a 47 y.o. female presenting with abdominal pain. The history is provided by the patient. No language interpreter was used.  Abdominal Pain Pain location:  R flank, L flank and RLQ Pain quality: aching and sharp   Pain severity:  Moderate Duration:  3 days Associated symptoms: diarrhea, nausea and vomiting   Associated symptoms: no chest pain, no dysuria, no fatigue, no fever, no hematuria and no shortness of breath   Nausea:    Severity:  Mild   Timing:  Intermittent  Pt is a 47 year old female who presents with abdominal pain and back pain with associated vomiting and diarrhea for the last 3 days. She reports that her back pain started first followed by vomiting then diarrhea. She has been nauseated on and off for 3 days and is tolerating oral fluids. She reports that she can drink water and black coffee without vomiting. She reports that after eating she usually vomits her food. She reports that she had eggs and [place] sausage for breakfast this morning and that was her last episode of vomiting. She is having sporadic diarrhea that she reports is brown and watery. No visible blood in her stool or emesis. She reports that she has had frequency with urination and urgency, no hematuria. No fever or chills or recent illness. No suspicious food intake or recent travel. She reports a history of kidney stones.    Past Medical History  Diagnosis Date  . Hypertension   . Hepatitis C   . Sciatica    Past Surgical History  Procedure Laterality Date  . Abdominal hysterectomy    . Sinus exploration    . Lithotripsy    . Kidney stone surgery Right 02/12/12  . Kidney stone surgery Left  07/15/2012  . Oophorectomy     Family History  Problem Relation Age of Onset  . Heart failure Mother   . Diabetes Father   . Cancer Sister    History  Substance Use Topics  . Smoking status: Current Every Day Smoker -- 0.50 packs/day for 1.5 years    Types: Cigarettes  . Smokeless tobacco: Never Used  . Alcohol Use: No   OB History   Grav Para Term Preterm Abortions TAB SAB Ect Mult Living                 Review of Systems  Constitutional: Negative for fever and fatigue.  Respiratory: Negative for shortness of breath.   Cardiovascular: Negative for chest pain.  Gastrointestinal: Positive for nausea, vomiting, abdominal pain and diarrhea. Negative for blood in stool.  Genitourinary: Positive for urgency and frequency. Negative for dysuria and hematuria.  Musculoskeletal: Positive for back pain.  Neurological: Negative for dizziness and weakness.  All other systems reviewed and are negative.    Allergies  Aspirin; Codeine; Compazine; Ivp dye; Ketorolac tromethamine; Norco; Penicillins; Reglan; Tegretol; Toradol; and Tramadol  Home Medications   Current Outpatient Rx  Name  Route  Sig  Dispense  Refill  . FLUoxetine (PROZAC) 40 MG capsule   Oral   Take 40 mg by mouth daily.         . hydrochlorothiazide (HYDRODIURIL) 25 MG tablet   Oral  Take 25 mg by mouth every morning.          Marland Kitchen ibuprofen (ADVIL,MOTRIN) 200 MG tablet   Oral   Take 800 mg by mouth every 6 (six) hours as needed (pain).         Marland Kitchen omeprazole (PRILOSEC) 40 MG capsule   Oral   Take 40 mg by mouth daily.         . promethazine (PHENERGAN) 25 MG tablet   Oral   Take 25 mg by mouth every 6 (six) hours as needed for nausea.         Marland Kitchen zolpidem (AMBIEN CR) 12.5 MG CR tablet   Oral   Take 12.5 mg by mouth at bedtime as needed for sleep.         . ciprofloxacin (CIPRO) 250 MG tablet   Oral   Take 1 tablet (250 mg total) by mouth 2 (two) times daily.   8 tablet   0   .  HYDROcodone-acetaminophen (NORCO/VICODIN) 5-325 MG per tablet   Oral   Take 15 tablets by mouth every 4 (four) hours as needed.   6 tablet   0   . ondansetron (ZOFRAN) 4 MG tablet   Oral   Take 1 tablet (4 mg total) by mouth every 6 (six) hours.   12 tablet   0    BP 90/60  Pulse 87  Temp(Src) 97.2 F (36.2 C) (Oral)  Resp 18  Wt 206 lb 6.4 oz (93.622 kg)  SpO2 95% Physical Exam  Nursing note and vitals reviewed. Constitutional: She is oriented to person, place, and time. She appears well-developed and well-nourished. No distress.  HENT:  Head: Normocephalic and atraumatic.  Mouth/Throat: Oropharynx is clear and moist.  Eyes: Conjunctivae and EOM are normal.  Neck: Normal range of motion. Neck supple. No JVD present. No tracheal deviation present. No thyromegaly present.  Cardiovascular: Normal rate, regular rhythm, normal heart sounds and intact distal pulses.   Pulmonary/Chest: Effort normal and breath sounds normal. No respiratory distress. She has no wheezes.  Abdominal: Soft. Normal appearance and bowel sounds are normal. There is no hepatosplenomegaly. There is tenderness in the right lower quadrant and left upper quadrant. There is CVA tenderness.  Musculoskeletal: Normal range of motion.  Neurological: She is alert and oriented to person, place, and time.  Skin: Skin is dry.  Psychiatric: She has a normal mood and affect. Her behavior is normal. Thought content normal.    ED Course  Procedures (including critical care time) Labs Review Labs Reviewed  URINALYSIS, ROUTINE W REFLEX MICROSCOPIC - Abnormal; Notable for the following:    Specific Gravity, Urine 1.004 (*)    Leukocytes, UA MODERATE (*)    All other components within normal limits  CBC WITH DIFFERENTIAL - Abnormal; Notable for the following:    WBC 10.6 (*)    Hemoglobin 15.2 (*)    Lymphs Abs 4.6 (*)    All other components within normal limits  COMPREHENSIVE METABOLIC PANEL - Abnormal; Notable for  the following:    Glucose, Bld 109 (*)    Total Bilirubin 0.2 (*)    All other components within normal limits  LIPASE, BLOOD  URINE MICROSCOPIC-ADD ON   Imaging Review Ct Abdomen Pelvis Wo Contrast  08/21/2013   CLINICAL DATA:  Abdominal pain, nausea, vomiting  EXAM: CT ABDOMEN AND PELVIS WITHOUT CONTRAST  TECHNIQUE: Multidetector CT imaging of the abdomen and pelvis was performed following the standard protocol without intravenous contrast.  COMPARISON:  06/26/2013  FINDINGS: Hypoventilation appreciated within the lung bases. Ill-defined area of ground-glass appearing density projects along the posterior periphery of the left hemi thorax. This area was not included on the previous CT. Differential considerations are atelectasis versus infiltrate versus scarring. The area measures 8.5 mm in diameter image number 5 series 3. A pulmonary nodule cannot be excluded and further evaluation with nonemergent, dedicated noncontrast chest CT is recommended.  The liver, spleen, adrenals, pancreas, are unremarkable. Stable coarse calcifications identified within the cortex of the right kidney as well as small medullary calculi. Stable bilateral cortical scarring is identified within the kidneys. There is no evidence of hydronephrosis, hydroureter, no ureterolithiasis on the right or left.  There is no evidence of bowel obstruction, enteritis, colitis, nor diverticulitis within the limitations of a non IV contrast CT. The appendix is appreciated and is unremarkable. There is no evidence of abdominal aortic aneurysm.  There is no evidence of abdominal pelvic free fluid, loculated fluid collections, masses, nor adenopathy. Diverticulosis is appreciated within the sigmoid colon. Degenerative disc disease changes appreciated L5-S1 level.  IMPRESSION: No CT evidence of obstructive or inflammatory abnormalities.  Indeterminate ground-glass nodular density left lung base further evaluation nonemergent noncontrast CT chest CT  recommended.   Electronically Signed   By: Salome Holmes M.D.   On: 08/21/2013 15:00    EKG Interpretation   None       MDM   1. Abdominal pain    Pt has a history of recurrent abdominal pain, UTI and kidney stones. WBC; mild leukocytosis. Urinalysis; moderate leuks and microscopic with WBC's, reports frequency. Vomiting and diarrhea may be viral in origin. Feeling better after 1 liter of NS, zofran and IV dilaudid. Prescriptions for zofran, ciprofloxacin and a small amount of Norco to go. Pt says she feels ok to go home and will follow-up with regular MD in [facility] this week. She is afebrile and non-toxic appearing. Stable for discharge. Abdomen CT; No hydronephrosis, hydroureter or uterolithiasis. No free fluid or masses. Diverticulosis in sigmoid. Left lung, with nodular density in base will warrant further follow-up and she agrees to follow this up in [facility].   Irish Elders, NP 08/22/13 1748  Irish Elders, NP 08/22/13 [phone]

## 2013-08-21 NOTE — ED Notes (Signed)
Pt c/o lower abd pain and lower back pain with N/V/D x 3 days

## 2013-08-23 ENCOUNTER — Emergency Department: Payer: Self-pay | Admitting: Emergency Medicine

## 2013-08-23 LAB — CBC WITH DIFFERENTIAL/PLATELET
Eosinophil #: 0.1 10*3/uL (ref 0.0–0.7)
HCT: 45.5 % (ref 35.0–47.0)
HGB: 15.5 g/dL (ref 12.0–16.0)
Lymphocyte #: 3.3 10*3/uL (ref 1.0–3.6)
MCH: 31.4 pg (ref 26.0–34.0)
MCHC: 34.2 g/dL (ref 32.0–36.0)
MCV: 92 fL (ref 80–100)
Monocyte #: 0.9 x10 3/mm (ref 0.2–0.9)
Monocyte %: 8.5 %
Neutrophil #: 5.6 10*3/uL (ref 1.4–6.5)
Neutrophil %: 55.5 %
RBC: 4.94 10*6/uL (ref 3.80–5.20)
RDW: 13.1 % (ref 11.5–14.5)
WBC: 10.2 10*3/uL (ref 3.6–11.0)

## 2013-08-23 LAB — BASIC METABOLIC PANEL
Chloride: 104 mmol/L (ref 98–107)
Co2: 28 mmol/L (ref 21–32)
Creatinine: 0.71 mg/dL (ref 0.60–1.30)
EGFR (Non-African Amer.): >60
Glucose: 182 mg/dL — ABNORMAL HIGH (ref 65–99)
Sodium: 136 mmol/L (ref 136–145)

## 2013-08-24 NOTE — ED Provider Notes (Signed)
  Medical screening examination/treatment/procedure(s) were performed by non-physician practitioner and as supervising physician I was immediately available for consultation/collaboration.  EKG Interpretation   None          Gerhard Munch, MD 08/24/13 1538

## 2013-09-06 IMAGING — CR DG CHEST 2V
1 series · 2 of 2 positions shown · non-contrast
Comparison: none

REASON FOR EXAM: cough, cp with cough. pt in [place]
COMMENTS:

PROCEDURE:     DXR - DXR CHEST PA (OR AP) AND LATERAL  - August 07, 2011  [DATE]
RESULT:     Comparison: 07/12/2010

[Series 1: w chest pa · 0.14mm/px · 2 of 2 slices shown]
[im 1/2]
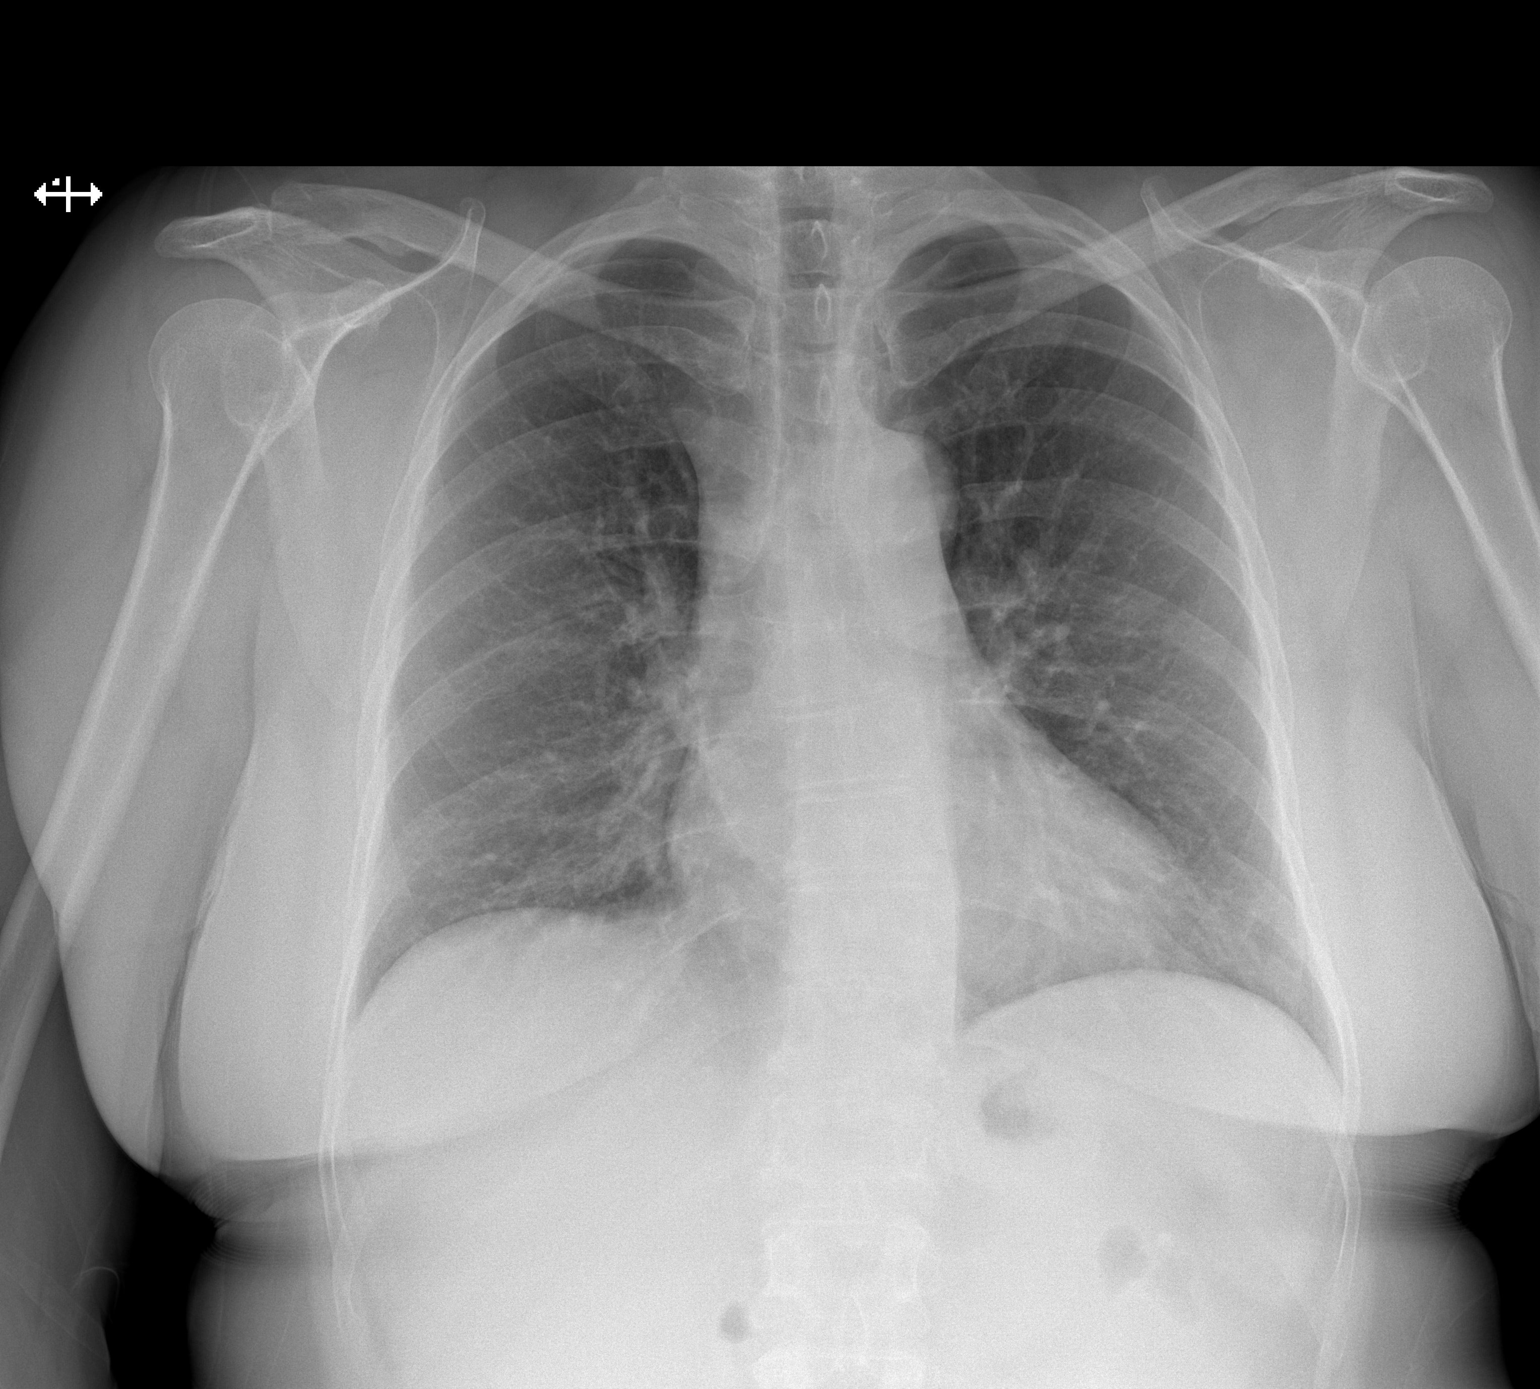
[im 2/2]
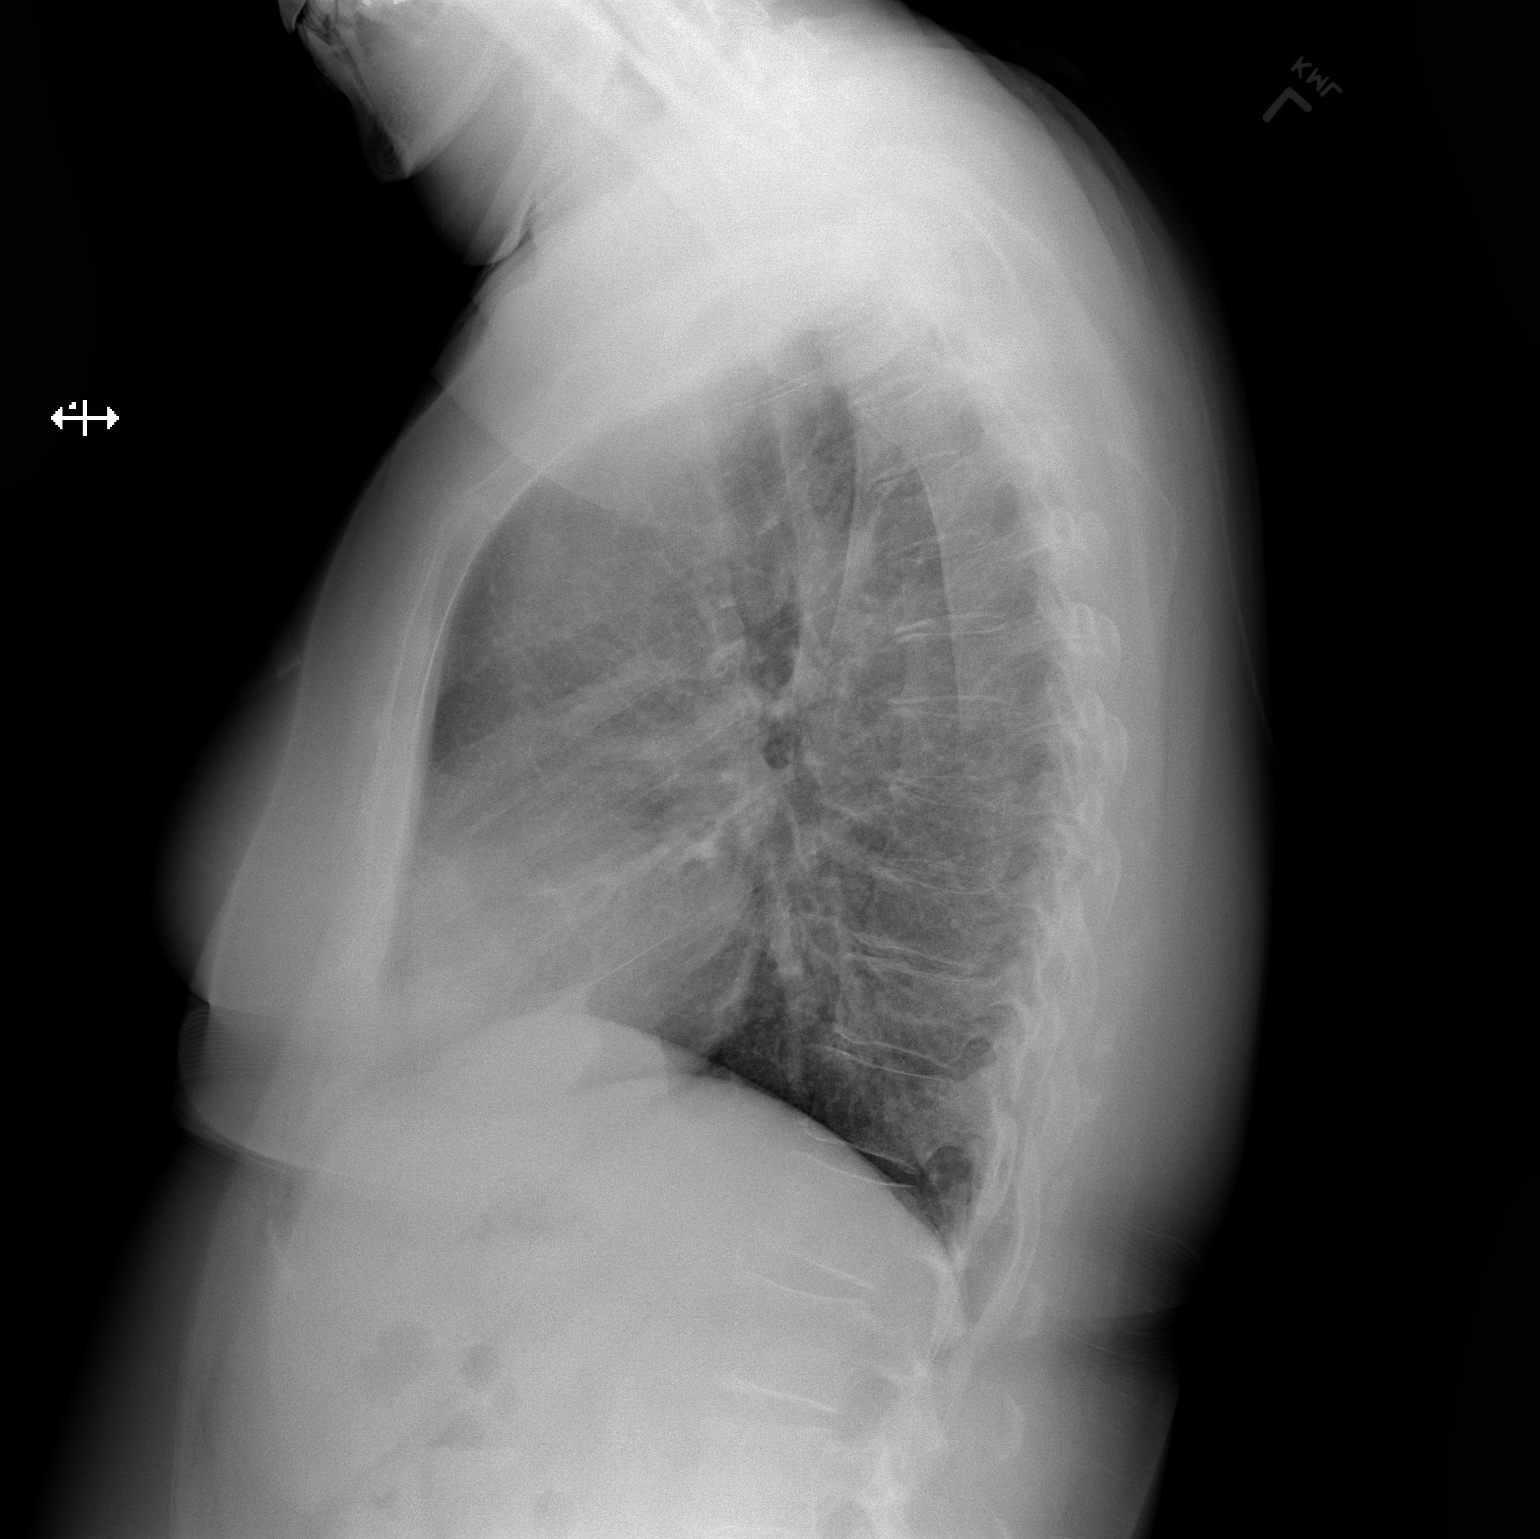

[2 of 2 positions shown; findings below may reference images not displayed]

FINDINGS: The heart and mediastinum are stable. No focal pulmonary opacities.
IMPRESSION: No acute cardiopulmonary disease.

## 2013-12-14 ENCOUNTER — Encounter (HOSPITAL_COMMUNITY): Payer: Self-pay | Admitting: Emergency Medicine

## 2013-12-14 ENCOUNTER — Emergency Department (HOSPITAL_COMMUNITY)
Admission: EM | Admit: 2013-12-14 | Discharge: 2013-12-14 | Disposition: A | Discharge status: Home / Self Care | Payer: Self-pay | Attending: Emergency Medicine | Admitting: Emergency Medicine

## 2013-12-14 ENCOUNTER — Emergency Department (HOSPITAL_COMMUNITY): Payer: Self-pay

## 2013-12-14 DIAGNOSIS — Z87442 Personal history of urinary calculi: Secondary | ICD-10-CM | POA: Insufficient documentation

## 2013-12-14 DIAGNOSIS — Z79899 Other long term (current) drug therapy: Secondary | ICD-10-CM | POA: Insufficient documentation

## 2013-12-14 DIAGNOSIS — I10 Essential (primary) hypertension: Secondary | ICD-10-CM | POA: Insufficient documentation

## 2013-12-14 DIAGNOSIS — Z9079 Acquired absence of other genital organ(s): Secondary | ICD-10-CM | POA: Insufficient documentation

## 2013-12-14 DIAGNOSIS — F17200 Nicotine dependence, unspecified, uncomplicated: Secondary | ICD-10-CM | POA: Insufficient documentation

## 2013-12-14 DIAGNOSIS — R112 Nausea with vomiting, unspecified: Secondary | ICD-10-CM | POA: Insufficient documentation

## 2013-12-14 DIAGNOSIS — M549 Dorsalgia, unspecified: Secondary | ICD-10-CM | POA: Insufficient documentation

## 2013-12-14 DIAGNOSIS — Z880 Allergy status to penicillin: Secondary | ICD-10-CM | POA: Insufficient documentation

## 2013-12-14 DIAGNOSIS — Z8619 Personal history of other infectious and parasitic diseases: Secondary | ICD-10-CM | POA: Insufficient documentation

## 2013-12-14 DIAGNOSIS — Z90710 Acquired absence of both cervix and uterus: Secondary | ICD-10-CM | POA: Insufficient documentation

## 2013-12-14 HISTORY — DX: Calculus of kidney: N20.0

## 2013-12-14 LAB — CBC WITH DIFFERENTIAL/PLATELET
BASOS ABS: 0 10*3/uL (ref 0.0–0.1)
Basophils Relative: 1 % (ref 0–1)
Eosinophils Absolute: 0.1 10*3/uL (ref 0.0–0.7)
Eosinophils Relative: 1 % (ref 0–5)
HCT: 45.7 % (ref 36.0–46.0)
Hemoglobin: 15.6 g/dL — ABNORMAL HIGH (ref 12.0–15.0)
LYMPHS ABS: 3.6 10*3/uL (ref 0.7–4.0)
LYMPHS PCT: 46 % (ref 12–46)
MCH: 31.7 pg (ref 26.0–34.0)
MCHC: 34.1 g/dL (ref 30.0–36.0)
MCV: 92.9 fL (ref 78.0–100.0)
Monocytes Absolute: 0.7 10*3/uL (ref 0.1–1.0)
Monocytes Relative: 9 % (ref 3–12)
NEUTROS ABS: 3.3 10*3/uL (ref 1.7–7.7)
NEUTROS PCT: 43 % (ref 43–77)
PLATELETS: 192 10*3/uL (ref 150–400)
RBC: 4.92 MIL/uL (ref 3.87–5.11)
RDW: 13.1 % (ref 11.5–15.5)
WBC: 7.7 10*3/uL (ref 4.0–10.5)

## 2013-12-14 LAB — COMPREHENSIVE METABOLIC PANEL WITH GFR
ALT: 19 U/L (ref 0–35)
AST: 21 U/L (ref 0–37)
Albumin: 4.1 g/dL (ref 3.5–5.2)
Alkaline Phosphatase: 97 U/L (ref 39–117)
BUN: 14 mg/dL (ref 6–23)
CO2: 26 meq/L (ref 19–32)
Calcium: 10.1 mg/dL (ref 8.4–10.5)
Chloride: 99 meq/L (ref 96–112)
Creatinine, Ser: 0.58 mg/dL (ref 0.50–1.10)
GFR calc Af Amer: >90 mL/min
GFR calc non Af Amer: >90 mL/min
Glucose, Bld: 109 mg/dL — ABNORMAL HIGH (ref 70–99)
Potassium: 4.4 meq/L (ref 3.7–5.3)
Sodium: 141 meq/L (ref 137–147)
Total Bilirubin: 0.4 mg/dL (ref 0.3–1.2)
Total Protein: 8 g/dL (ref 6.0–8.3)

## 2013-12-14 LAB — URINALYSIS, ROUTINE W REFLEX MICROSCOPIC
Bilirubin Urine: NEGATIVE
GLUCOSE, UA: NEGATIVE mg/dL
HGB URINE DIPSTICK: NEGATIVE
Ketones, ur: NEGATIVE mg/dL
Nitrite: NEGATIVE
PROTEIN: NEGATIVE mg/dL
SPECIFIC GRAVITY, URINE: 1.003 — AB (ref 1.005–1.030)
Urobilinogen, UA: 0.2 mg/dL (ref 0.0–1.0)
pH: 7 (ref 5.0–8.0)

## 2013-12-14 LAB — URINE MICROSCOPIC-ADD ON

## 2013-12-14 LAB — LIPASE, BLOOD: LIPASE: 32 U/L (ref 11–59)

## 2013-12-14 MED ORDER — PROMETHAZINE HCL 25 MG PO TABS: 25.0000 mg | ORAL_TABLET | Freq: Four times a day (QID) | ORAL | Status: DC | PRN

## 2013-12-14 MED ORDER — ONDANSETRON HCL 4 MG/2ML IJ SOLN
4.0000 mg | Freq: Once | INTRAMUSCULAR | Status: AC
  Administered 2013-12-14: 4 mg via INTRAVENOUS
  Filled 2013-12-14: qty 2

## 2013-12-14 MED ORDER — FENTANYL CITRATE 0.05 MG/ML IJ SOLN
50.0000 ug | Freq: Once | INTRAMUSCULAR | Status: AC
  Administered 2013-12-14: 50 ug via INTRAVENOUS
  Filled 2013-12-14: qty 2

## 2013-12-14 MED ORDER — HYDROMORPHONE HCL PF 1 MG/ML IJ SOLN
1.0000 mg | Freq: Once | INTRAMUSCULAR | Status: AC
  Administered 2013-12-14: 1 mg via INTRAVENOUS
  Filled 2013-12-14: qty 1

## 2013-12-14 NOTE — ED Notes (Addendum)
Pt reports hx of kidney stones. Reports right flank pain x3 with vomiting, pain 8/10. Denies dysuria but reports frequency.  Past kidney stones were 100% calcium. Reports hx of UTI with kidney stones.

## 2013-12-14 NOTE — ED Provider Notes (Signed)
CSN: [id number]     Arrival date & time 12/14/13  1106 History   First MD Initiated Contact with Patient 12/14/13 1130     Chief Complaint  Patient presents with  . Flank Pain      HPI Patient reports developing right flank and back pain 3 days ago.  She's had nausea and vomiting.  No hematemesis.  No dysuria but does have some urinary frequency.  She states this feels similar to her prior kidney stone.  She denies abdominal pain.  No chest pain or shortness of breath.  No other complaints.  Symptoms are mild to moderate in severity.   Past Medical History  Diagnosis Date  . Hypertension   . Hepatitis C   . Sciatica   . Kidney stones    Past Surgical History  Procedure Laterality Date  . Abdominal hysterectomy    . Sinus exploration    . Lithotripsy    . Kidney stone surgery Right 02/12/12  . Kidney stone surgery Left 07/15/2012  . Oophorectomy     Family History  Problem Relation Age of Onset  . Heart failure Mother   . Diabetes Father   . Cancer Sister    History  Substance Use Topics  . Smoking status: Current Every Day Smoker -- 0.50 packs/day for 1.5 years    Types: Cigarettes  . Smokeless tobacco: Never Used  . Alcohol Use: No   OB History   Grav Para Term Preterm Abortions TAB SAB Ect Mult Living                 Review of Systems  All other systems reviewed and are negative.      Allergies  Aspirin; Codeine; Compazine; Ivp dye; Ketorolac tromethamine; Norco; Penicillins; Reglan; Tegretol; Toradol; and Tramadol  Home Medications   Current Outpatient Rx  Name  Route  Sig  Dispense  Refill  . FLUoxetine (PROZAC) 40 MG capsule   Oral   Take 40 mg by mouth daily.         . hydrochlorothiazide (HYDRODIURIL) 25 MG tablet   Oral   Take 25 mg by mouth every morning.          Marland Kitchen losartan (COZAAR) 50 MG tablet   Oral   Take 50 mg by mouth daily.         Marland Kitchen omeprazole (PRILOSEC) 40 MG capsule   Oral   Take 40 mg by mouth daily.         .  promethazine (PHENERGAN) 25 MG tablet   Oral   Take 25 mg by mouth every 6 (six) hours as needed for nausea.         Marland Kitchen zolpidem (AMBIEN CR) 12.5 MG CR tablet   Oral   Take 12.5 mg by mouth at bedtime as needed for sleep.          BP 130/95  Pulse 90  Temp(Src) 98.1 F (36.7 C) (Oral)  Resp 16  SpO2 99% Physical Exam  Nursing note and vitals reviewed. Constitutional: She is oriented to person, place, and time. She appears well-developed and well-nourished. No distress.  HENT:  Head: Normocephalic and atraumatic.  Eyes: EOM are normal.  Neck: Normal range of motion.  Cardiovascular: Normal rate, regular rhythm and normal heart sounds.   Pulmonary/Chest: Effort normal and breath sounds normal.  Abdominal: Soft. She exhibits no distension. There is no tenderness.  Musculoskeletal: Normal range of motion.  Mild tenderness of her right flank and right parathoracic  region.  No thoracic or lumbar tenderness.  No rash noted.  Neurological: She is alert and oriented to person, place, and time.  Skin: Skin is warm and dry.  Psychiatric: She has a normal mood and affect. Judgment normal.    ED Course  Procedures (including critical care time) Labs Review Labs Reviewed  CBC WITH DIFFERENTIAL - Abnormal; Notable for the following:    Hemoglobin 15.6 (*)    All other components within normal limits  COMPREHENSIVE METABOLIC PANEL - Abnormal; Notable for the following:    Glucose, Bld 109 (*)    All other components within normal limits  URINALYSIS, ROUTINE W REFLEX MICROSCOPIC - Abnormal; Notable for the following:    Specific Gravity, Urine 1.003 (*)    Leukocytes, UA TRACE (*)    All other components within normal limits  URINE CULTURE  LIPASE, BLOOD  URINE MICROSCOPIC-ADD ON   Imaging Review Ct Abdomen Pelvis Wo Contrast  12/14/2013   CLINICAL DATA:  Flank pain.  EXAM: CT ABDOMEN AND PELVIS WITHOUT CONTRAST  TECHNIQUE: Multidetector CT imaging of the abdomen and pelvis was  performed following the standard protocol without intravenous contrast.  COMPARISON:  CT ABD/PELV WO CM dated 08/21/2013  FINDINGS: Liver normal. Spleen normal. Pancreas normal. No biliary distention. Gallbladder is unremarkable.  Tiny left adrenal nodule, stable, consistent with adenoma. Nonobstructive right nephrolithiasis. Stable renal cortical calcification on the right noted. There is no evidence of obstructing ureteral stone or hydronephrosis. The bladder is nondistended. Calcifications within pelvis consistent with phleboliths. Hysterectomy. No adnexal mass. No free pelvic fluid.  No significant adenopathy.  Abdominal aorta normal in caliber.  Appendix normal. No inflammatory changes in the right or left lower quadrant. No bowel distention. Diffuse diverticulosis noted. No evidence of free air. No mesenteric masses.  Abdominal wall is intact. No significant hernia noted. Lung bases are clear. Heart size normal. No acute bony abnormality. Degenerative changes lumbosacral spine.  IMPRESSION: 1. No evidence of obstructing urolithiasis. Nonobstructive right nephrolithiasis. 2. Stable left adrenal nodule consistent with adenoma appear 3. Hysterectomy .   Electronically Signed   By: Maisie Fushomas  Register   On: 12/14/2013 12:26  I personally reviewed the imaging tests through PACS system I reviewed available ER/hospitalization records through the EMR     EKG Interpretation None      MDM   Final diagnoses:  Back pain    Pain control the emergency department.  Labs and urine are without significant abnormality.  CT scan demonstrates no evidence of ureterolithiasis, however she does have nonobstructive right nephrolithiasis.  Home with PCP followup tomorrow.  Likely musculoskeletal versus recently passed stone and ongoing ureteral colic.    Lyanne CoKevin M Hamzeh Tall, MD 12/14/13 1309

## 2013-12-15 LAB — URINE CULTURE

## 2013-12-17 ENCOUNTER — Emergency Department: Payer: Self-pay | Admitting: Emergency Medicine

## 2013-12-21 ENCOUNTER — Emergency Department: Payer: Self-pay | Admitting: Emergency Medicine

## 2013-12-24 ENCOUNTER — Emergency Department: Payer: Self-pay | Admitting: Emergency Medicine

## 2014-01-01 ENCOUNTER — Emergency Department: Payer: Self-pay | Admitting: Emergency Medicine

## 2014-01-17 ENCOUNTER — Encounter (HOSPITAL_COMMUNITY): Payer: Self-pay | Admitting: Emergency Medicine

## 2014-01-17 ENCOUNTER — Emergency Department (HOSPITAL_COMMUNITY)
Admission: EM | Admit: 2014-01-17 | Discharge: 2014-01-17 | Disposition: A | Discharge status: Home / Self Care | Payer: Self-pay | Attending: Emergency Medicine | Admitting: Emergency Medicine

## 2014-01-17 DIAGNOSIS — X500XXA Overexertion from strenuous movement or load, initial encounter: Secondary | ICD-10-CM | POA: Insufficient documentation

## 2014-01-17 DIAGNOSIS — R030 Elevated blood-pressure reading, without diagnosis of hypertension: Secondary | ICD-10-CM

## 2014-01-17 DIAGNOSIS — IMO0001 Reserved for inherently not codable concepts without codable children: Secondary | ICD-10-CM

## 2014-01-17 DIAGNOSIS — Z8619 Personal history of other infectious and parasitic diseases: Secondary | ICD-10-CM | POA: Insufficient documentation

## 2014-01-17 DIAGNOSIS — Z87442 Personal history of urinary calculi: Secondary | ICD-10-CM | POA: Insufficient documentation

## 2014-01-17 DIAGNOSIS — Z880 Allergy status to penicillin: Secondary | ICD-10-CM | POA: Insufficient documentation

## 2014-01-17 DIAGNOSIS — Z79899 Other long term (current) drug therapy: Secondary | ICD-10-CM | POA: Insufficient documentation

## 2014-01-17 DIAGNOSIS — Y929 Unspecified place or not applicable: Secondary | ICD-10-CM | POA: Insufficient documentation

## 2014-01-17 DIAGNOSIS — IMO0002 Reserved for concepts with insufficient information to code with codable children: Secondary | ICD-10-CM | POA: Insufficient documentation

## 2014-01-17 DIAGNOSIS — Z8739 Personal history of other diseases of the musculoskeletal system and connective tissue: Secondary | ICD-10-CM | POA: Insufficient documentation

## 2014-01-17 DIAGNOSIS — Y9389 Activity, other specified: Secondary | ICD-10-CM | POA: Insufficient documentation

## 2014-01-17 DIAGNOSIS — M533 Sacrococcygeal disorders, not elsewhere classified: Secondary | ICD-10-CM

## 2014-01-17 DIAGNOSIS — I10 Essential (primary) hypertension: Secondary | ICD-10-CM | POA: Insufficient documentation

## 2014-01-17 DIAGNOSIS — F17200 Nicotine dependence, unspecified, uncomplicated: Secondary | ICD-10-CM | POA: Insufficient documentation

## 2014-01-17 MED ORDER — PREDNISONE 20 MG PO TABS: 40.0000 mg | ORAL_TABLET | Freq: Every day | ORAL | Status: DC

## 2014-01-17 MED ORDER — IBUPROFEN 800 MG PO TABS: 800.0000 mg | ORAL_TABLET | Freq: Three times a day (TID) | ORAL | Status: DC

## 2014-01-17 NOTE — Discharge Instructions (Signed)
°Emergency Department Resource Guide °1) Find a Doctor and Pay Out of Pocket °Although you won't have to find out who is covered by your insurance plan, it is a good idea to ask around and get recommendations. You will then need to call the office and see if the doctor you have chosen will accept you as a new patient and what types of options they offer for patients who are self-pay. Some doctors offer discounts or will set up payment plans for their patients who do not have insurance, but you will need to ask so you aren't surprised when you get to your appointment. ° °2) Contact Your Local Health Department °Not all health departments have doctors that can see patients for sick visits, but many do, so it is worth a call to see if yours does. If you don't know where your local health department is, you can check in your phone book. The CDC also has a tool to help you locate your state's health department, and many state websites also have listings of all of their local health departments. ° °3) Find a Walk-in Clinic °If your illness is not likely to be very severe or complicated, you may want to try a walk in clinic. These are popping up all over the country in pharmacies, drugstores, and shopping centers. They're usually staffed by nurse practitioners or physician assistants that have been trained to treat common illnesses and complaints. They're usually fairly quick and inexpensive. However, if you have serious medical issues or chronic medical problems, these are probably not your best option. ° °No Primary Care Doctor: °- Call [facility] at  832-8000 - they can help you locate a primary care doctor that  accepts your insurance, provides certain services, etc. °- Physician Referral Service- 1-800-533-3463 ° °Chronic Pain Problems: °Organization         Address  Phone   Notes  ° [facility]  (336) 297-2271 Patients need to be referred by their primary care doctor.  ° °Medication  Assistance: °Organization         Address  Phone   Notes  °[place] [facility] [address] °[place],  [place] [address] (336) 641-8030 --Must be a resident of [place] °-- Must have NO insurance coverage whatsoever (no Medicaid/ Medicare, etc.) °-- The pt. MUST have a primary care doctor that directs their care regularly and follows them in the community °  °[facility]  (866) 331-1348   °[employer]  (888) 892-1162   ° °Agencies that provide inexpensive medical care: °Organization         Address  Phone   Notes  °[facility]  (336) 832-8035   °[facility]    (336) 832-7272   °[facility] [address] °[place], [place] [address] (336) 832-4777   °[facility] [address], °[place] (336) 271-4999   °[facility]    (336) 373-0678   °[facility]    (336) 272-1050   °[facility] ° [address], [place] Phone:  (336) 832-4444, Fax:  (336) 832-4440 Hours of Operation:  9 am - 6 pm, M-F.  Also accepts Medicaid/Medicare and self-pay.  °[facility] ° [address], [place] Phone: (336) 832-3150, Fax: (336) 832-3151. Hours of Operation:  8:30 am - 5:30 pm, M-F.  Also accepts Medicaid and self-pay.  °[facility] [place] [address], [place] Phone: (336) 878-6027   °[facility] [address], [place], [place] (336)723-1848, Ext. 123 Mondays & Thursdays: 7-9 AM.  First 15 patients are seen on a first come, first serve basis. °  ° °Medicaid-accepting [place] Providers: ° °Organization         Address  Phone   Notes  °[facility] [address], [place] (336) 641-2100 Also accepts self-pay patients.  °[facility] [address], [place] ° (336) 856-9996   °[facility] [address], [place]  (336) 288-8857   °[facility] [address] [place] [address], [place] (336) 299-7000   °Veita Bland [address], [place]  ° (336) 373-1557 Only accepts [place] Access Medicaid patients after they have their name applied to their card.  ° °Self-Pay (no insurance) in [place]: ° °Organization         Address  Phone   Notes  °Sickle Cell Patients, [facility] [address], [place] (336) 832-1970   °[facility] [address], [place] (336) 832-4400   °[facility] ° [address], [place] (336) 992-4800   °[facility]/Dr. Osei-Bonsu ° [address] [place] [address], [place] or [address], [place] (336) 841-8500 Phone number for both [place] and [place] locations is the same.  °[facility] [address], [place] (336) 299-0000   °[facility] [address] [place] [address], [place] or [address] (336) 852-7530 °(336) 878-2260   °[facility] [address], [place] (336) 350-1642, phone; (336) 294-5005, fax Sees patients 1st and 3rd Saturday of every month.  Must not qualify for public or private insurance (i.e. Medicaid, Medicare, [place] Health Choice, Veterans' Benefits) • Household income should be no more than 200% of the poverty level •The clinic cannot treat you if you are pregnant or think you are pregnant • Sexually transmitted diseases are not treated at the clinic.  ° ° °Dental Care: °Organization         Address  Phone  Notes  °[place] [facility] [address], [place] (336) 641-6152 Accepts children up to age 21 who are enrolled in Medicaid or [place] Health Choice; pregnant women with a Medicaid card; and children who have applied for Medicaid or [place] Health Choice, but were declined, whose parents can pay a reduced fee at time of service.  °[place]  [facility] [place]  [address], [place] (336) 641-7733 Accepts children up to age 21 who are enrolled in Medicaid or [place] Health Choice; pregnant women with a Medicaid card; and children who have applied for Medicaid or  Health Choice, but were declined, whose parents can pay a reduced fee at time of service.  °[facility] ° [address], [place] (336) 641-4533 Patients are seen by appointment only. Walk-ins are not accepted. [facility] will see patients 18 years of age and older. °Monday - Tuesday (8am-5pm) °Most Wednesdays (8:30-5pm) °$30 per visit, cash only  °[facility] ° [address], [place] (336) 641-4533 Patients are seen by appointment only. Walk-ins are not accepted. [facility] will see patients 18 years of age and older. °One   Wednesday Evening (Monthly: Volunteer Based).  $30 per visit, cash only  °[facility]  (919) 537-3737 for adults; Children under age 4, call Graduate Pediatric Dentistry at (919) [phone]. Children aged 4-14, please call (919) 537-3737 to request a pediatric application. ° Dental services are provided in all areas of dental care including fillings, crowns and bridges, complete and partial dentures, implants, gum treatment, root canals, and extractions. Preventive care is also provided. Treatment is provided to both adults and children. °Patients are selected via a lottery and there is often a waiting list. °  °[facility] [address], °[place] ° (336) 763-8833 [email or web address] °  °[facility] [address], [place], [place] (336)723-1848, Ext. 123 Second and Fourth Thursday of each month, opens at 6:30 AM; Clinic ends at 9 AM.  Patients are seen on a first-come first-served basis, and a limited number are seen during each clinic.  ° °[facility] ° [address], [place], [place] (336) 723-7904    Eligibility Requirements °You must have lived in [place], [place], or [place] counties for at least the last three months. °  You cannot be eligible for state or federal sponsored healthcare insurance, including Veterans Administration, Medicaid, or Medicare. °  You generally cannot be eligible for healthcare insurance through your employer.  °  How to apply: °Eligibility screenings are held every Tuesday and Wednesday afternoon from 1:00 pm until 4:00 pm. You do not need an appointment for the interview!  °[facility] [address], [place], [place] 336-631-2330   °[facility]  336-342-8273   °[facility]  336-703-3100   °[facility]  336-570-6415   ° °Behavioral Health Resources in the Community: °Intensive Outpatient Programs °Organization         Address  Phone  Notes  °[facility] [address], [place], [place] 336-878-6098   °[facility] [address], [place], [place] 336-832-9800   °[facility] [address], [place], [place] ° 336-882-2125   °[facility] [address],  °[place], [place] 1-800-853-5163 or 336-641-4981   °Substance Abuse Resources °Organization         Address  Phone  Notes  °[facility]  336-882-2125   °[facility]  336-784-9470   °[facility]  336-285-9073   °[facility]  336-845-3988   °Residential & Outpatient Substance Abuse Program  1-800-659-3381   °Psychological Services °Organization         Address  Phone  Notes  °[facility]  336- 832-9600   °[facility]  336- 378-7881   °[facility] [address], [place] 1-800-853-5163 or 336-641-4981   ° °Mobile Crisis Teams °Organization         Address  Phone  Notes  °[facility], Mobile Crisis Care Unit  1-877-626-1772   °Assertive °[facility] ° [address]  [place], [place] 336-834-9664   °Sharon DeEsch [address] °[place] 336-554-5454   ° °Self-Help/Support Groups °Organization         Address  Phone             Notes  °[facility]  - variety of support groups  336- 373-1402 Call for more information  °Narcotics Anonymous (NA), [facility] [address], °[place]  2 meetings at this location  ° °  Residential Treatment Programs °Organization         Address  Phone  Notes  °[facility] [address],    °[place]  1-866-801-8205   °[facility] ° [address], [place], [place] 704-293-8524   °[facility] [address], [place] 336-845-3988 Admissions: 8am-3pm M-F  °[facility] [address]    °[place], [place] 336-841-1104   °[facility] [address], [place], [place] 336-379-7146   °[facility] [address]  °[place], [place] 336-285-9073   °[facility] Intensive Outpatient [address], [place], [place] 336-852-3033   °[facility] [address]  °[place], [place] 1-877-615-2722 or 336-784-9470   °[facility] [address] [place], [place] 336-227-7417 Accepts Medicaid  °[facility] [address]  ° [place] 1-800-659-3381 Substance Abuse/Addiction Treatment  ° °[place] Behavioral Health Resources °Organization         Address  Phone  Notes  °[facility]  (888) 581-9988   °Julie Brannon, PhD [address] [place], [place]   (336) 349-5553 or (336) 951-0000   °[facility]   [address] °[place] (336) 349-4454   °[facility] [address], [place], [place] (336) 342-8316 Insurance/Medicaid/sponsorship through [facility]  °[facility] [address]                                    [place], [place] (336) 342-8316 Therapy/tele-psych/case    °[facility] [address]  ° [place], [place] (336) 349-2233    °Dr. Arfeen  (336) 349-4544   °[facility] [place]  [employer] [place] [facility] 1) [address], [place] °2) [address], [place] °3)  [address], [place] (336) 349-3220 °(336) 342-7768 ° °(336) 342-8140   °[place] [facility] (336) 342-1394 or (336) 342-3537 (After Hours)    ° ° °

## 2014-01-17 NOTE — ED Notes (Signed)
Pt ambulatory to exam room with steady gait.  

## 2014-01-17 NOTE — ED Provider Notes (Signed)
CSN: [id number]     Arrival date & time 01/17/14  1700 History  This chart was scribed for non-physician practitioner, Santiago GladHeather Leshawn Straka, PA-C working with Juliet RudeNathan R. Rubin PayorPickering, MD by Greggory StallionKayla Andersen, ED scribe. This patient was seen in room WTR9/WTR9 and the patient's care was started at 5:48 PM.    Chief Complaint  Patient presents with  . Hip Pain   The history is provided by the patient. No language interpreter was used.   HPI Comments: [name] is a 48 y.o. female who presents to the Emergency Department complaining of sudden onset right lower back pain that radiates into her right hip and leg that started earlier today. States she does not do a lot of heavy lifting or bending. Pt states went to get off of the couch and heard a pop. She has had similar back pain in the past but states it has never gone into her hip. Bearing weight, sitting up and laying down worsen the pain. Pt has taken 800 mg ibuprofen with little relief. Denies fever, chills, bowel or bladder incontinence, numbness or tingling. Pt has an appointment with a pain management clinic in one month but is not currently seeing one. Denies history of diabetes.   Past Medical History  Diagnosis Date  . Hypertension   . Hepatitis C   . Sciatica   . Kidney stones    Past Surgical History  Procedure Laterality Date  . Abdominal hysterectomy    . Sinus exploration    . Lithotripsy    . Kidney stone surgery Right 02/12/12  . Kidney stone surgery Left 07/15/2012  . Oophorectomy     Family History  Problem Relation Age of Onset  . Heart failure Mother   . Diabetes Father   . Cancer Sister    History  Substance Use Topics  . Smoking status: Current Every Day Smoker -- 0.50 packs/day for 1.5 years    Types: Cigarettes  . Smokeless tobacco: Never Used  . Alcohol Use: No   OB History   Grav Para Term Preterm Abortions TAB SAB Ect Mult Living                 Review of Systems  Constitutional: Negative for fever and  chills.  Genitourinary:       Negative for bowel or bladder incontinence.   Musculoskeletal: Positive for arthralgias, back pain and myalgias.  Neurological: Negative for numbness.  All other systems reviewed and are negative.  Allergies  Aspirin; Codeine; Compazine; Ivp dye; Ketorolac tromethamine; Norco; Penicillins; Reglan; Tegretol; Toradol; and Tramadol  Home Medications   Prior to Admission medications   Medication Sig Start Date End Date Taking? Authorizing Provider  FLUoxetine (PROZAC) 40 MG capsule Take 40 mg by mouth daily.    Historical Provider, MD  hydrochlorothiazide (HYDRODIURIL) 25 MG tablet Take 25 mg by mouth every morning.     Historical Provider, MD  losartan (COZAAR) 50 MG tablet Take 50 mg by mouth daily.    Historical Provider, MD  omeprazole (PRILOSEC) 40 MG capsule Take 40 mg by mouth daily.    Historical Provider, MD  promethazine (PHENERGAN) 25 MG tablet Take 25 mg by mouth every 6 (six) hours as needed for nausea.    Historical Provider, MD  promethazine (PHENERGAN) 25 MG tablet Take 1 tablet (25 mg total) by mouth every 6 (six) hours as needed for nausea or vomiting. 12/14/13   Lyanne CoKevin M Campos, MD  zolpidem (AMBIEN CR) 12.5 MG CR tablet Take  12.5 mg by mouth at bedtime as needed for sleep.    Historical Provider, MD   BP 184/65  Pulse 105  Temp(Src) 97.7 F (36.5 C) (Oral)  Resp 18  Wt 194 lb (87.998 kg)  SpO2 98%  Physical Exam  Nursing note and vitals reviewed. Constitutional: She appears well-developed and well-nourished.  HENT:  Head: Normocephalic and atraumatic.  Mouth/Throat: Oropharynx is clear and moist.  Eyes: EOM are normal. Pupils are equal, round, and reactive to light.  Neck: Normal range of motion. Neck supple.  Cardiovascular: Normal rate, regular rhythm and normal heart sounds.   Pulses:      Dorsalis pedis pulses are 2+ on the right side.  Pulmonary/Chest: Effort normal and breath sounds normal. She has no wheezes.   Musculoskeletal: Normal range of motion. She exhibits tenderness. She exhibits no edema.  Tenderness to palpation over right posterior SI joint. No overlying erythema, edema or warmth.   Neurological: She is alert.  Sensation of all toes of right foot is intact.   Skin: Skin is warm and dry.  Psychiatric: She has a normal mood and affect. Her behavior is normal.    ED Course  Procedures (including critical care time)  DIAGNOSTIC STUDIES: Oxygen Saturation is 98% on RA, normal by my interpretation.    COORDINATION OF CARE: 5:53 PM-Discussed treatment plan which includes prednisone and continuing ibuprofen with pt at bedside and pt agreed to plan. Advised pt that an xray is not necessary since there was no injury.   Labs Review Labs Reviewed - No data to display  Imaging Review No results found.   EKG Interpretation None      MDM   Final diagnoses:  None   Patient with pain of her right SI.  No neurological deficits and normal neuro exam.  Patient can walk but states is painful.  No loss of bowel or bladder control.  No concern for cauda equina.  No fever, night sweats, weight loss, h/o cancer, IVDU.  RICE protocol and pain medicine indicated and discussed with patient.     I personally performed the services described in this documentation, which was scribed in my presence. The recorded information has been reviewed and is accurate.  Santiago GladHeather Mitchell Epling, PA-C 01/20/14 [phone]

## 2014-01-17 NOTE — ED Notes (Signed)
Pt c/o right hip pain that started today after cleaning and when she sat down she heard a pop. Pt states that she can barely put any weight on right leg.

## 2014-01-21 NOTE — ED Provider Notes (Signed)
Medical screening examination/treatment/procedure(s) were performed by non-physician practitioner and as supervising physician I was immediately available for consultation/collaboration.   EKG Interpretation None       [place] Norberto R. Qasim Diveley, MD 01/21/14 2324 

## 2014-01-24 IMAGING — CT CT ABD-PELV W/O CM
1 series · 14 of 21 positions shown, 19 images · non-contrast
Comparison: CT abdomen pelvis 10/19/2010

CLINICAL DATA: Left flank pain with white blood cells in urine and
red blood cells in urine.

CT ABDOMEN AND PELVIS WITHOUT CONTRAST
TECHNIQUE: Multidetector CT imaging of the abdomen and pelvis was
performed following the standard protocol without intravenous
contrast.

[Series 4: lung · axial · 0.62mm/px · z∈[-289,-199]mm · 14 of 21 slices shown, 19 images]
[im 2/21  soft-tissue]
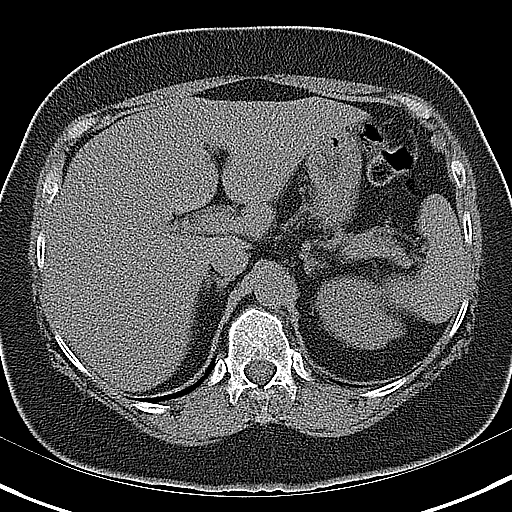
[im 2/21  bone]
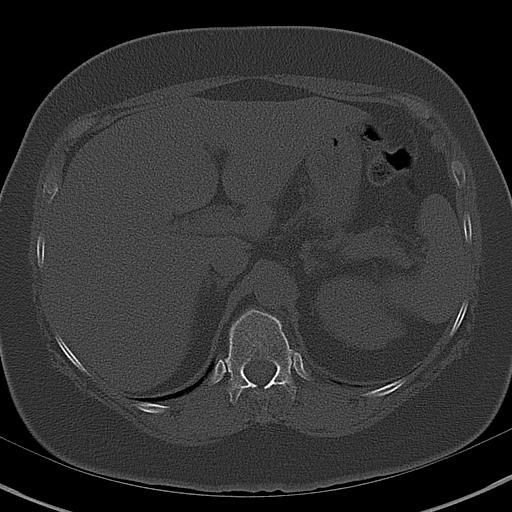
[im 4/21  soft-tissue]
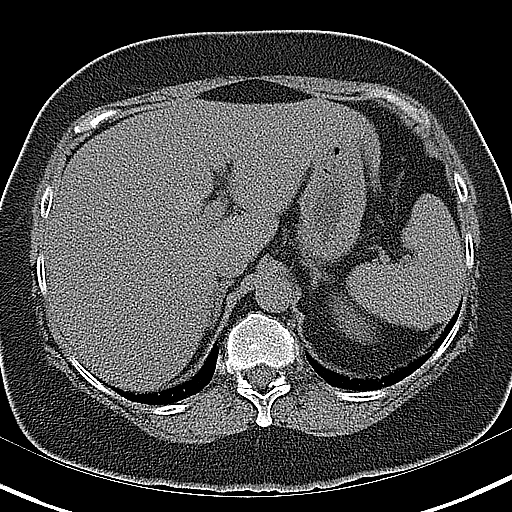
[im 5/21  soft-tissue]
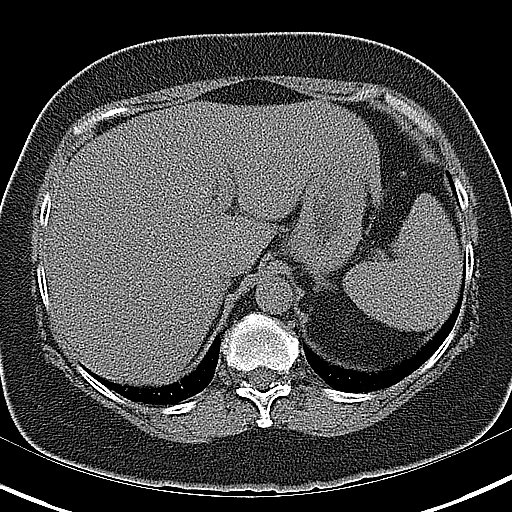
[im 7/21  soft-tissue]
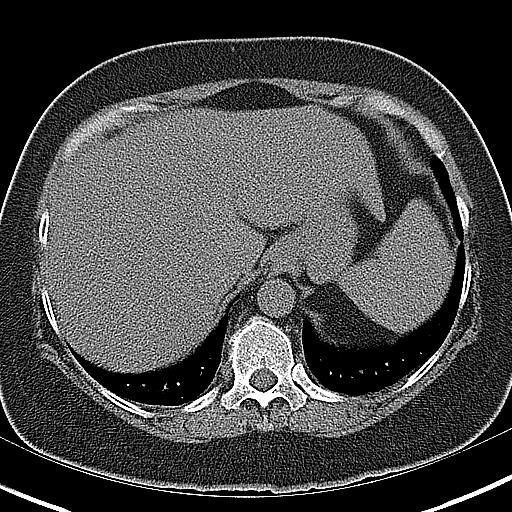
[im 8/21  soft-tissue]
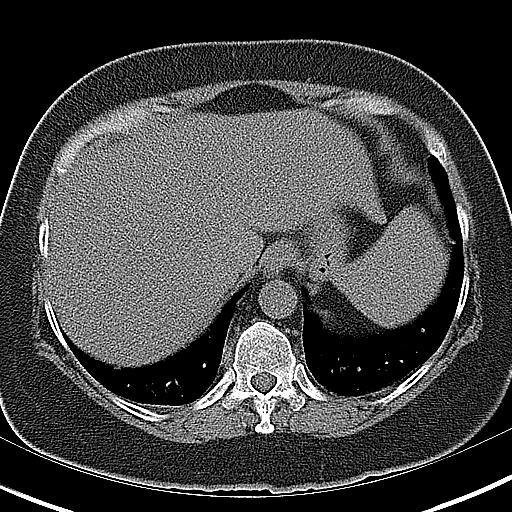
[im 10/21  soft-tissue]
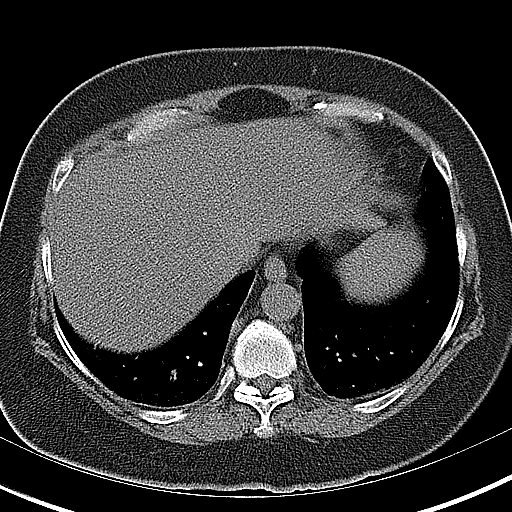
[im 11/21  soft-tissue]
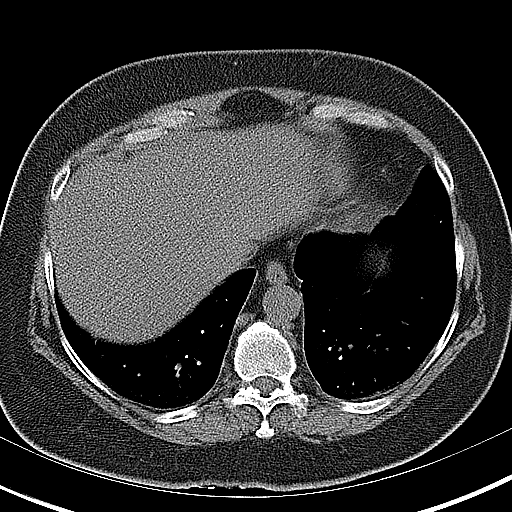
[im 12/21  soft-tissue]
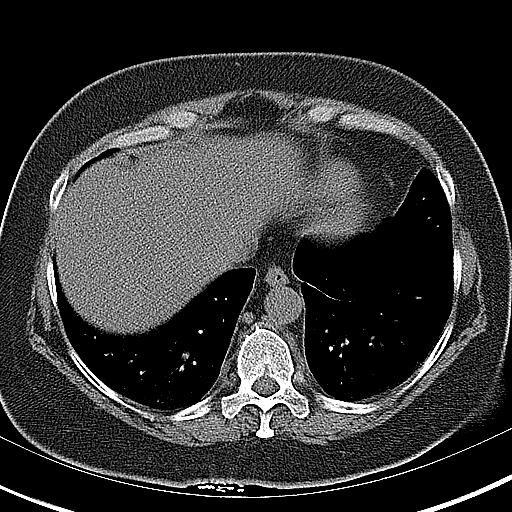
[im 14/21  soft-tissue]
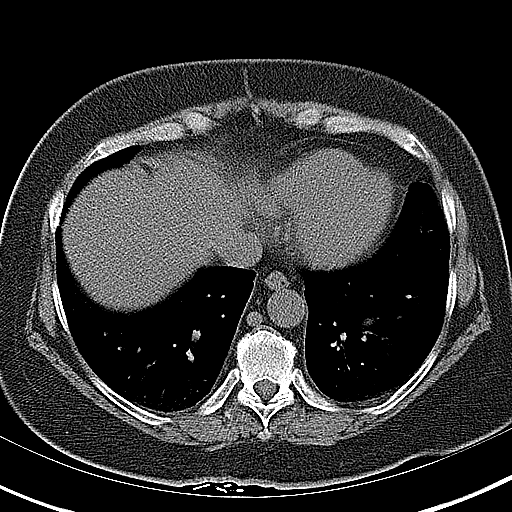
[im 14/21  bone]
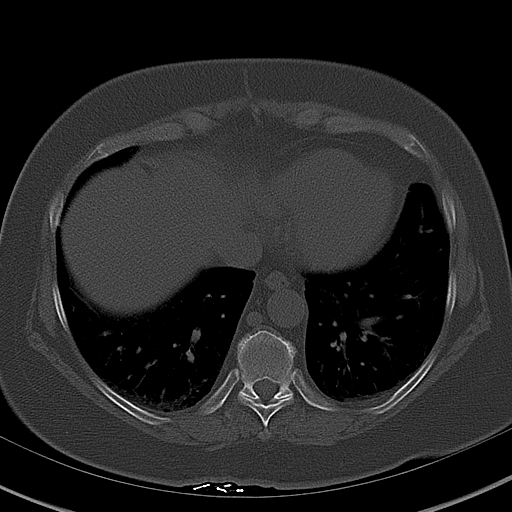
[im 15/21  soft-tissue]
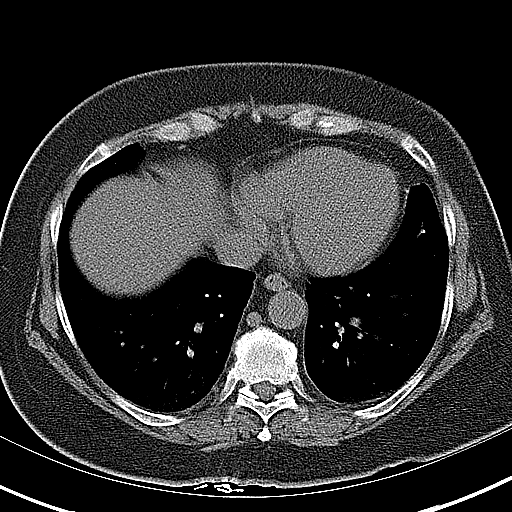
[im 17/21  soft-tissue]
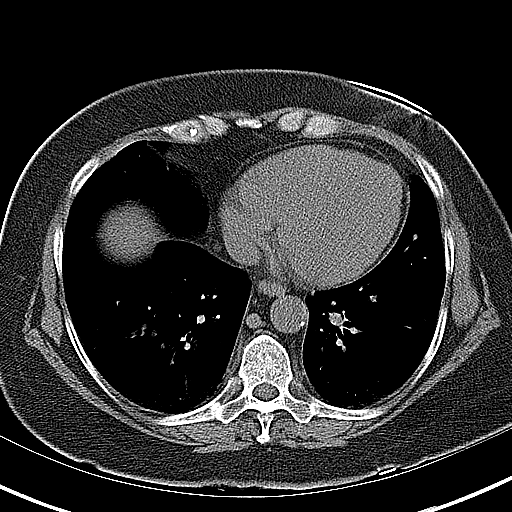
[im 17/21  lung]
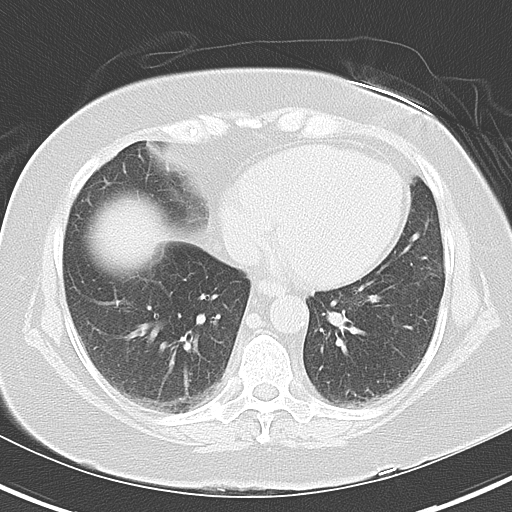
[im 18/21  soft-tissue]
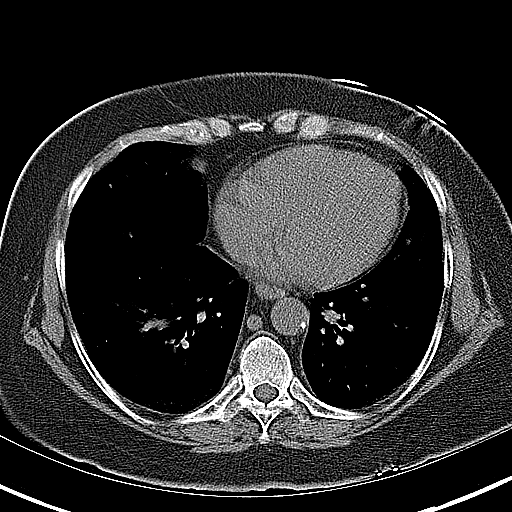
[im 18/21  lung]
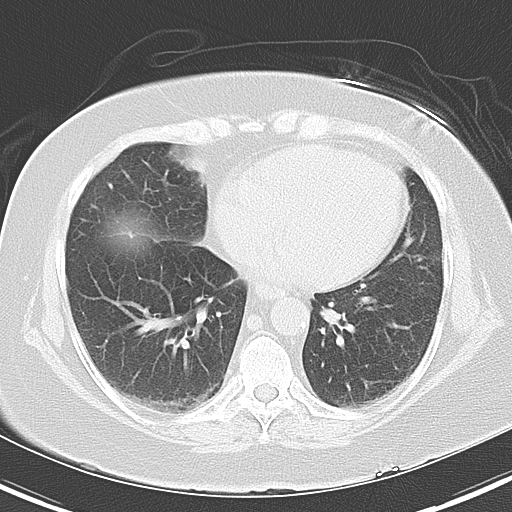
[im 19/21  lung]
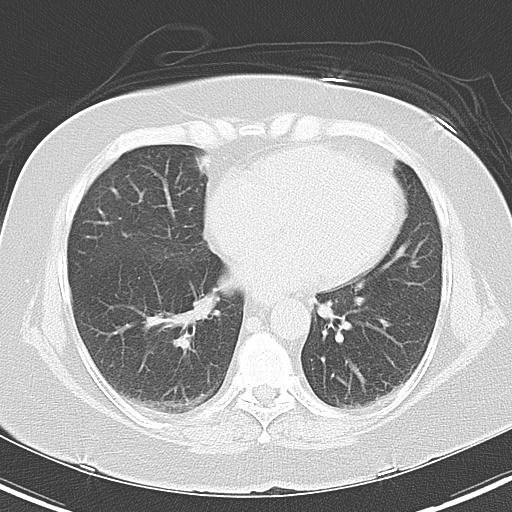
[im 20/21  soft-tissue]
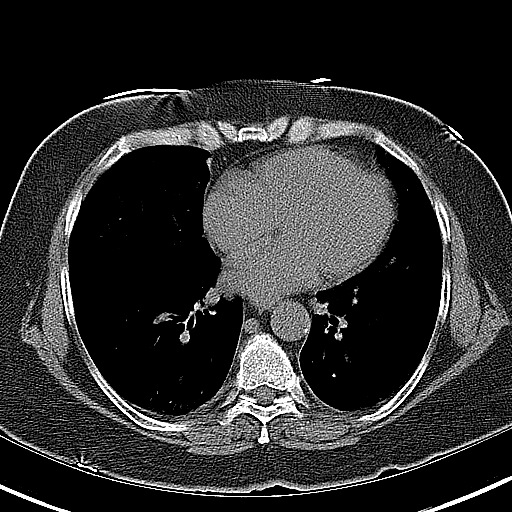
[im 20/21  lung]
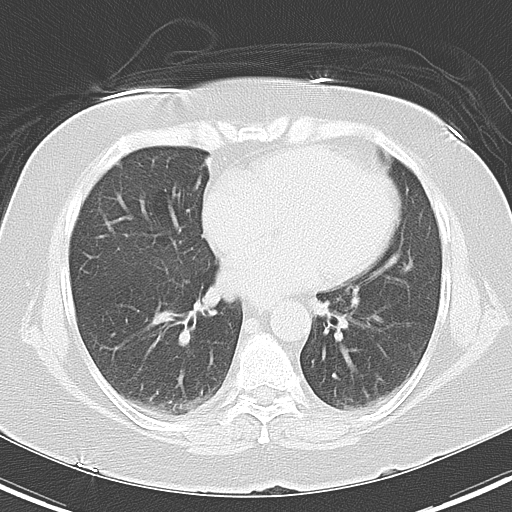

[14 of 21 positions shown; findings below may reference images not displayed]

FINDINGS: Mild bibasilar atelectasis.

There are multiple bilateral renal calculi.  There are areas of
cortical scarring bilaterally, right greater than left.  The
largest stone is in the lower pole of the right kidney measuring 18
x 10 mm.  The largest stone in the left kidney measures 4 mm.
There is no hydronephrosis.  Both ureters are normal in caliber.
No ureteral stone or periureteric stranding is identified.  No
stone is seen within the urinary bladder.

The noncontrast appearance of the liver, gallbladder, spleen,
adrenal glands, and pancreas is within normal limits.  The stomach
is decompressed.  Small bowel loops are normal in caliber.

The abdominal aorta is normal in caliber.

The appendix is normal.  The colon contains a moderate amount of
stool is normal in caliber.  There is moderate diverticulosis of
the sigmoid colon.  There are no acute inflammatory changes.
Urinary bladder has normal appearance.  There is no ascites or
lymphadenopathy.  Negative for free air.  There is degenerative
disc disease at L5-S1 with prominent endplate sclerosis.  No acute
bony abnormality.
IMPRESSION: 1.  Bilateral nonobstructing nephrolithiasis, great right greater
than left with chronic bilateral renal scarring.
2.  No CT evidence of recently passed stone.
3.  Moderate sigmoid colon diverticulosis.
4.  Degenerative disc disease at L5 S1.

## 2014-01-31 ENCOUNTER — Emergency Department (HOSPITAL_COMMUNITY)
Admission: EM | Admit: 2014-01-31 | Discharge: 2014-01-31 | Disposition: A | Discharge status: Home / Self Care | Payer: Self-pay | Attending: Emergency Medicine | Admitting: Emergency Medicine

## 2014-01-31 ENCOUNTER — Encounter (HOSPITAL_COMMUNITY): Payer: Self-pay | Admitting: Emergency Medicine

## 2014-01-31 ENCOUNTER — Emergency Department (HOSPITAL_COMMUNITY): Payer: Self-pay

## 2014-01-31 DIAGNOSIS — R079 Chest pain, unspecified: Secondary | ICD-10-CM | POA: Insufficient documentation

## 2014-01-31 DIAGNOSIS — R197 Diarrhea, unspecified: Secondary | ICD-10-CM | POA: Insufficient documentation

## 2014-01-31 DIAGNOSIS — Z87442 Personal history of urinary calculi: Secondary | ICD-10-CM | POA: Insufficient documentation

## 2014-01-31 DIAGNOSIS — R300 Dysuria: Secondary | ICD-10-CM | POA: Insufficient documentation

## 2014-01-31 DIAGNOSIS — Z9889 Other specified postprocedural states: Secondary | ICD-10-CM | POA: Insufficient documentation

## 2014-01-31 DIAGNOSIS — Z791 Long term (current) use of non-steroidal anti-inflammatories (NSAID): Secondary | ICD-10-CM | POA: Insufficient documentation

## 2014-01-31 DIAGNOSIS — Z8739 Personal history of other diseases of the musculoskeletal system and connective tissue: Secondary | ICD-10-CM | POA: Insufficient documentation

## 2014-01-31 DIAGNOSIS — Z9079 Acquired absence of other genital organ(s): Secondary | ICD-10-CM | POA: Insufficient documentation

## 2014-01-31 DIAGNOSIS — I10 Essential (primary) hypertension: Secondary | ICD-10-CM | POA: Insufficient documentation

## 2014-01-31 DIAGNOSIS — R630 Anorexia: Secondary | ICD-10-CM | POA: Insufficient documentation

## 2014-01-31 DIAGNOSIS — R109 Unspecified abdominal pain: Secondary | ICD-10-CM

## 2014-01-31 DIAGNOSIS — Z79899 Other long term (current) drug therapy: Secondary | ICD-10-CM | POA: Insufficient documentation

## 2014-01-31 DIAGNOSIS — IMO0002 Reserved for concepts with insufficient information to code with codable children: Secondary | ICD-10-CM | POA: Insufficient documentation

## 2014-01-31 DIAGNOSIS — Z90710 Acquired absence of both cervix and uterus: Secondary | ICD-10-CM | POA: Insufficient documentation

## 2014-01-31 DIAGNOSIS — F17200 Nicotine dependence, unspecified, uncomplicated: Secondary | ICD-10-CM | POA: Insufficient documentation

## 2014-01-31 DIAGNOSIS — Z8619 Personal history of other infectious and parasitic diseases: Secondary | ICD-10-CM | POA: Insufficient documentation

## 2014-01-31 DIAGNOSIS — R112 Nausea with vomiting, unspecified: Secondary | ICD-10-CM | POA: Insufficient documentation

## 2014-01-31 DIAGNOSIS — R609 Edema, unspecified: Secondary | ICD-10-CM | POA: Insufficient documentation

## 2014-01-31 DIAGNOSIS — R6883 Chills (without fever): Secondary | ICD-10-CM | POA: Insufficient documentation

## 2014-01-31 DIAGNOSIS — Z880 Allergy status to penicillin: Secondary | ICD-10-CM | POA: Insufficient documentation

## 2014-01-31 DIAGNOSIS — R1084 Generalized abdominal pain: Secondary | ICD-10-CM | POA: Insufficient documentation

## 2014-01-31 LAB — URINALYSIS, ROUTINE W REFLEX MICROSCOPIC
BILIRUBIN URINE: NEGATIVE
GLUCOSE, UA: 500 mg/dL — AB
Hgb urine dipstick: NEGATIVE
Ketones, ur: NEGATIVE mg/dL
Nitrite: NEGATIVE
PH: 6.5 (ref 5.0–8.0)
Protein, ur: NEGATIVE mg/dL
SPECIFIC GRAVITY, URINE: 1.015 (ref 1.005–1.030)
Urobilinogen, UA: 0.2 mg/dL (ref 0.0–1.0)

## 2014-01-31 LAB — CBC WITH DIFFERENTIAL/PLATELET
Basophils Absolute: 0 10*3/uL (ref 0.0–0.1)
Basophils Relative: 0 % (ref 0–1)
EOS ABS: 0.1 10*3/uL (ref 0.0–0.7)
EOS PCT: 2 % (ref 0–5)
HCT: 43 % (ref 36.0–46.0)
Hemoglobin: 14.6 g/dL (ref 12.0–15.0)
LYMPHS PCT: 38 % (ref 12–46)
Lymphs Abs: 2.9 10*3/uL (ref 0.7–4.0)
MCH: 31.9 pg (ref 26.0–34.0)
MCHC: 34 g/dL (ref 30.0–36.0)
MCV: 93.9 fL (ref 78.0–100.0)
Monocytes Absolute: 0.5 10*3/uL (ref 0.1–1.0)
Monocytes Relative: 6 % (ref 3–12)
NEUTROS PCT: 54 % (ref 43–77)
Neutro Abs: 4.1 10*3/uL (ref 1.7–7.7)
PLATELETS: 195 10*3/uL (ref 150–400)
RBC: 4.58 MIL/uL (ref 3.87–5.11)
RDW: 13.8 % (ref 11.5–15.5)
WBC: 7.6 10*3/uL (ref 4.0–10.5)

## 2014-01-31 LAB — COMPREHENSIVE METABOLIC PANEL
ALT: 22 U/L (ref 0–35)
AST: 22 U/L (ref 0–37)
Albumin: 3.5 g/dL (ref 3.5–5.2)
Alkaline Phosphatase: 109 U/L (ref 39–117)
BUN: 11 mg/dL (ref 6–23)
CALCIUM: 9.4 mg/dL (ref 8.4–10.5)
CO2: 24 mEq/L (ref 19–32)
Chloride: 100 mEq/L (ref 96–112)
Creatinine, Ser: 0.59 mg/dL (ref 0.50–1.10)
GFR calc Af Amer: >90 mL/min (ref >90)
GFR calc non Af Amer: >90 mL/min (ref >90)
Glucose, Bld: 216 mg/dL — ABNORMAL HIGH (ref 70–99)
Potassium: 3.8 mEq/L (ref 3.7–5.3)
SODIUM: 140 meq/L (ref 137–147)
TOTAL PROTEIN: 7.2 g/dL (ref 6.0–8.3)
Total Bilirubin: 0.2 mg/dL — ABNORMAL LOW (ref 0.3–1.2)

## 2014-01-31 LAB — URINE MICROSCOPIC-ADD ON

## 2014-01-31 LAB — LIPASE, BLOOD: Lipase: 33 U/L (ref 11–59)

## 2014-01-31 MED ORDER — MORPHINE SULFATE 4 MG/ML IJ SOLN
4.0000 mg | Freq: Once | INTRAMUSCULAR | Status: AC
  Administered 2014-01-31: 4 mg via INTRAVENOUS
  Filled 2014-01-31: qty 1

## 2014-01-31 MED ORDER — ONDANSETRON HCL 4 MG/2ML IJ SOLN
4.0000 mg | Freq: Once | INTRAMUSCULAR | Status: AC
  Administered 2014-01-31: 4 mg via INTRAVENOUS
  Filled 2014-01-31: qty 2

## 2014-01-31 MED ORDER — OXYCODONE-ACETAMINOPHEN 5-325 MG PO TABS: 1.0000 | ORAL_TABLET | Freq: Four times a day (QID) | ORAL | Status: DC | PRN

## 2014-01-31 MED ORDER — SODIUM CHLORIDE 0.9 % IV BOLUS (SEPSIS)
1000.0000 mL | Freq: Once | INTRAVENOUS | Status: AC
  Administered 2014-01-31: 1000 mL via INTRAVENOUS

## 2014-01-31 MED ORDER — HYDROMORPHONE HCL PF 1 MG/ML IJ SOLN
1.0000 mg | Freq: Once | INTRAMUSCULAR | Status: AC
  Administered 2014-01-31: 1 mg via INTRAVENOUS
  Filled 2014-01-31: qty 1

## 2014-01-31 MED ORDER — HYDROMORPHONE HCL PF 1 MG/ML IJ SOLN
0.5000 mg | Freq: Once | INTRAMUSCULAR | Status: AC
  Administered 2014-01-31: 0.5 mg via INTRAVENOUS
  Filled 2014-01-31: qty 1

## 2014-01-31 MED ORDER — PROMETHAZINE HCL 25 MG PO TABS: 25.0000 mg | ORAL_TABLET | Freq: Four times a day (QID) | ORAL | Status: DC | PRN

## 2014-01-31 NOTE — ED Notes (Signed)
Dilaudid not given at this time due to patient BP. Will reassess

## 2014-01-31 NOTE — ED Provider Notes (Signed)
CSN: [id number]     Arrival date & time 01/31/14  1447 History   First MD Initiated Contact with Patient 01/31/14 1753     Chief Complaint  Patient presents with  . Abdominal Pain  . Emesis  . Diarrhea     (Consider location/radiation/quality/duration/timing/severity/associated sxs/prior Treatment) Patient is a 48 y.o. female presenting with abdominal pain, vomiting, and diarrhea. The history is provided by the patient.  Abdominal Pain Associated symptoms: chills, diarrhea, dysuria, nausea and vomiting   Associated symptoms: no chest pain and no shortness of breath   Emesis Associated symptoms: abdominal pain, chills and diarrhea   Associated symptoms: no headaches   Diarrhea Associated symptoms: abdominal pain, chills and vomiting   Associated symptoms: no headaches    patient's had abdominal pain with nausea vomiting diarrhea for the last few days. The pain is on the right. It is crampy and constant. It is worse after eating, she states she has a decreased appetite. She's had some chills but no fever. She's had occasional dysuria. He also said occasional chest pain. She does have a history of abdominal pain. No sick contacts. Denies vaginal bleeding or discharge and has had a hysterectomy  Past Medical History  Diagnosis Date  . Hypertension   . Hepatitis C   . Sciatica   . Kidney stones    Past Surgical History  Procedure Laterality Date  . Abdominal hysterectomy    . Sinus exploration    . Lithotripsy    . Kidney stone surgery Right 02/12/12  . Kidney stone surgery Left 07/15/2012  . Oophorectomy     Family History  Problem Relation Age of Onset  . Heart failure Mother   . Diabetes Father   . Cancer Sister    History  Substance Use Topics  . Smoking status: Current Every Day Smoker -- 0.50 packs/day for 1.5 years    Types: Cigarettes  . Smokeless tobacco: Never Used  . Alcohol Use: No   OB History   Grav Para Term Preterm Abortions TAB SAB Ect Mult Living                  Review of Systems  Constitutional: Positive for chills and appetite change. Negative for activity change.  Eyes: Negative for pain.  Respiratory: Negative for chest tightness and shortness of breath.   Cardiovascular: Negative for chest pain and leg swelling.  Gastrointestinal: Positive for nausea, vomiting, abdominal pain and diarrhea.  Genitourinary: Positive for dysuria. Negative for flank pain.  Musculoskeletal: Negative for back pain and neck stiffness.  Skin: Negative for rash.  Neurological: Negative for weakness, numbness and headaches.  Psychiatric/Behavioral: Negative for behavioral problems.      Allergies  Aspirin; Codeine; Compazine; Ivp dye; Ketorolac tromethamine; Norco; Penicillins; Reglan; Tegretol; Toradol; and Tramadol  Home Medications   Prior to Admission medications   Medication Sig Start Date End Date Taking? Authorizing Provider  FLUoxetine (PROZAC) 40 MG capsule Take 40 mg by mouth daily.   Yes Historical Provider, MD  hydrochlorothiazide (HYDRODIURIL) 25 MG tablet Take 25 mg by mouth every morning.    Yes Historical Provider, MD  ibuprofen (ADVIL,MOTRIN) 800 MG tablet Take 1 tablet (800 mg total) by mouth 3 (three) times daily. 01/17/14  Yes Heather Laisure, PA-C  losartan (COZAAR) 50 MG tablet Take 50 mg by mouth daily.   Yes Historical Provider, MD  omeprazole (PRILOSEC) 40 MG capsule Take 40 mg by mouth daily.   Yes Historical Provider, MD  predniSONE (DELTASONE) 20  MG tablet Take 2 tablets (40 mg total) by mouth daily. 01/17/14  Yes Heather Laisure, PA-C  promethazine (PHENERGAN) 25 MG tablet Take 25 mg by mouth every 6 (six) hours as needed for nausea.   Yes Historical Provider, MD  zolpidem (AMBIEN CR) 12.5 MG CR tablet Take 12.5 mg by mouth at bedtime as needed for sleep.   Yes Historical Provider, MD   BP 100/53  Pulse 79  Temp(Src) 97.6 F (36.4 C) (Oral)  Resp 18  SpO2 97% Physical Exam  Nursing note and vitals  reviewed. Constitutional: She is oriented to person, place, and time. She appears well-developed and well-nourished.  HENT:  Head: Normocephalic and atraumatic.  Eyes: EOM are normal. Pupils are equal, round, and reactive to light.  Neck: Normal range of motion. Neck supple.  Cardiovascular: Normal rate, regular rhythm and normal heart sounds.   No murmur heard. Pulmonary/Chest: Effort normal and breath sounds normal. No respiratory distress. She has no wheezes. She has no rales.  Abdominal: She exhibits no distension. There is tenderness. There is no rebound and no guarding.  Mild diffuse tenderness with hyperactive bowel sounds. More tender on the right compared to left  Musculoskeletal: Normal range of motion. She exhibits edema.  Neurological: She is alert and oriented to person, place, and time. No cranial nerve deficit.  Skin: Skin is warm and dry.  Psychiatric: She has a normal mood and affect. Her speech is normal.    ED Course  Procedures (including critical care time) Labs Review Labs Reviewed  COMPREHENSIVE METABOLIC PANEL - Abnormal; Notable for the following:    Glucose, Bld 216 (*)    Total Bilirubin 0.2 (*)    All other components within normal limits  URINALYSIS, ROUTINE W REFLEX MICROSCOPIC - Abnormal; Notable for the following:    APPearance CLOUDY (*)    Glucose, UA 500 (*)    Leukocytes, UA TRACE (*)    All other components within normal limits  URINE MICROSCOPIC-ADD ON - Abnormal; Notable for the following:    Squamous Epithelial / LPF MANY (*)    Bacteria, UA FEW (*)    All other components within normal limits  CBC WITH DIFFERENTIAL  LIPASE, BLOOD    Imaging Review Dg Abd Acute W/chest  01/31/2014   CLINICAL DATA:  48 year old female with abdominal pain and diarrhea.  EXAM: ACUTE ABDOMEN SERIES (ABDOMEN 2 VIEW & CHEST 1 VIEW)  COMPARISON:  12/14/2013 CT and 10/24/2007 chest radiograph  FINDINGS: The cardiomediastinal silhouette is unremarkable.  Mild  elevation of the right hemidiaphragm again noted.  There is no evidence of airspace disease, pleural effusion or pneumothorax.  The bowel gas pattern is unremarkable.  There is no evidence of bowel obstruction or pneumoperitoneum.  No suspicious calcifications are identified.  No acute bony abnormalities noted.  IMPRESSION: No acute abnormalities.  Normal bowel gas pattern.   Electronically Signed   By: Laveda Abbe M.D.   On: 01/31/2014 19:40     EKG Interpretation None      MDM   Final diagnoses:  Nausea vomiting and diarrhea  Abdominal pain    Patient with nausea vomiting diarrhea with abdominal pain. Reviewing records she has had for negative CT scans with only some renal stones. Laboratories reassuring. Patient's tolerate orals. She had some continued pain, but feels that she is safe for discharge. She may need gastroenterology followup    Juliet Rude. Rubin Payor, MD 02/01/14 [phone]

## 2014-01-31 NOTE — ED Notes (Signed)
Pt presents to department for evaluation of RLQ abdominal pain and N/V/D. Symptoms ongoing x3 days. Also states difficulty urinating. 8/10 pain at the time. Pt is alert and oriented x4.

## 2014-01-31 NOTE — Discharge Instructions (Signed)
Abdominal Pain, Adult °Many things can cause abdominal pain. Usually, abdominal pain is not caused by a disease and will improve without treatment. It can often be observed and treated at home. Your health care provider will do a physical exam and possibly order blood tests and X-rays to help determine the seriousness of your pain. However, in many cases, more time must pass before a clear cause of the pain can be found. Before that point, your health care provider may not know if you need more testing or further treatment. °HOME CARE INSTRUCTIONS  °Monitor your abdominal pain for any changes. The following actions may help to alleviate any discomfort you are experiencing: °· Only take over-the-counter or prescription medicines as directed by your health care provider. °· Do not take laxatives unless directed to do so by your health care provider. °· Try a clear liquid diet (broth, tea, or water) as directed by your health care provider. Slowly move to a bland diet as tolerated. °SEEK MEDICAL CARE IF: °· You have unexplained abdominal pain. °· You have abdominal pain associated with nausea or diarrhea. °· You have pain when you urinate or have a bowel movement. °· You experience abdominal pain that wakes you in the night. °· You have abdominal pain that is worsened or improved by eating food. °· You have abdominal pain that is worsened with eating fatty foods. °SEEK IMMEDIATE MEDICAL CARE IF:  °· Your pain does not go away within 2 hours. °· You have a fever. °· You keep throwing up (vomiting). °· Your pain is felt only in portions of the abdomen, such as the right side or the left lower portion of the abdomen. °· You pass bloody or black tarry stools. °MAKE SURE YOU: °· Understand these instructions.   °· Will watch your condition.   °· Will get help right away if you are not doing well or get worse.   °Document Released: 06/05/2005 Document Revised: 06/16/2013 Document Reviewed: 05/05/2013 °[employer]® Patient  Information ©2014 [employer]. ° °Nausea and Vomiting °Nausea is a sick feeling that often comes before throwing up (vomiting). Vomiting is a reflex where stomach contents come out of your mouth. Vomiting can cause severe loss of body fluids (dehydration). Children and elderly adults can become dehydrated quickly, especially if they also have diarrhea. Nausea and vomiting are symptoms of a condition or disease. It is important to find the cause of your symptoms. °CAUSES  °· Direct irritation of the stomach lining. This irritation can result from increased acid production (gastroesophageal reflux disease), infection, food poisoning, taking certain medicines (such as nonsteroidal anti-inflammatory drugs), alcohol use, or tobacco use. °· Signals from the brain. These signals could be caused by a headache, heat exposure, an inner ear disturbance, increased pressure in the brain from injury, infection, a tumor, or a concussion, pain, emotional stimulus, or metabolic problems. °· An obstruction in the gastrointestinal tract (bowel obstruction). °· Illnesses such as diabetes, hepatitis, gallbladder problems, appendicitis, kidney problems, cancer, sepsis, atypical symptoms of a heart attack, or eating disorders. °· Medical treatments such as chemotherapy and radiation. °· Receiving medicine that makes you sleep (general anesthetic) during surgery. °DIAGNOSIS °Your caregiver may ask for tests to be done if the problems do not improve after a few days. Tests may also be done if symptoms are severe or if the reason for the nausea and vomiting is not clear. Tests may include: °· Urine tests. °· Blood tests. °· Stool tests. °· Cultures (to look for evidence of infection). °· X-rays or   other imaging studies. °Test results can help your caregiver make decisions about treatment or the need for additional tests. °TREATMENT °You need to stay well hydrated. Drink frequently but in small amounts. You may wish to drink water, sports  drinks, clear broth, or eat frozen ice pops or gelatin dessert to help stay hydrated. When you eat, eating slowly may help prevent nausea. There are also some antinausea medicines that may help prevent nausea. °HOME CARE INSTRUCTIONS  °· Take all medicine as directed by your caregiver. °· If you do not have an appetite, do not force yourself to eat. However, you must continue to drink fluids. °· If you have an appetite, eat a normal diet unless your caregiver tells you differently. °· Eat a variety of complex carbohydrates (rice, wheat, potatoes, bread), lean meats, yogurt, fruits, and vegetables. °· Avoid high-fat foods because they are more difficult to digest. °· Drink enough water and fluids to keep your urine clear or pale yellow. °· If you are dehydrated, ask your caregiver for specific rehydration instructions. Signs of dehydration may include: °· Severe thirst. °· Dry lips and mouth. °· Dizziness. °· Dark urine. °· Decreasing urine frequency and amount. °· Confusion. °· Rapid breathing or pulse. °SEEK IMMEDIATE MEDICAL CARE IF:  °· You have blood or brown flecks (like coffee grounds) in your vomit. °· You have black or bloody stools. °· You have a severe headache or stiff neck. °· You are confused. °· You have severe abdominal pain. °· You have chest pain or trouble breathing. °· You do not urinate at least once every 8 hours. °· You develop cold or clammy skin. °· You continue to vomit for longer than 24 to 48 hours. °· You have a fever. °MAKE SURE YOU:  °· Understand these instructions. °· Will watch your condition. °· Will get help right away if you are not doing well or get worse. °Document Released: 08/26/2005 Document Revised: 11/18/2011 Document Reviewed: 01/23/2011 °[employer]® Patient Information ©2014 [employer]. ° °Diarrhea °Diarrhea is frequent loose and watery bowel movements. It can cause you to feel weak and dehydrated. Dehydration can cause you to become tired and thirsty, have a dry mouth,  and have decreased urination that often is dark yellow. Diarrhea is a sign of another problem, most often an infection that will not last long. In most cases, diarrhea typically lasts 2 3 days. However, it can last longer if it is a sign of something more serious. It is important to treat your diarrhea as directed by your caregive to lessen or prevent future episodes of diarrhea. °CAUSES  °Some common causes include: °· Gastrointestinal infections caused by viruses, bacteria, or parasites. °· Food poisoning or food allergies. °· Certain medicines, such as antibiotics, chemotherapy, and laxatives. °· Artificial sweeteners and fructose. °· Digestive disorders. °HOME CARE INSTRUCTIONS °· Ensure adequate fluid intake (hydration): have 1 cup (8 oz) of fluid for each diarrhea episode. Avoid fluids that contain simple sugars or sports drinks, fruit juices, whole milk products, and sodas. Your urine should be clear or pale yellow if you are drinking enough fluids. Hydrate with an oral rehydration solution that you can purchase at pharmacies, retail stores, and online. You can prepare an oral rehydration solution at home by mixing the following ingredients together: °·   tsp table salt. °· ¾ tsp baking soda. °·  tsp salt substitute containing potassium chloride. °· 1  tablespoons sugar. °· 1 L (34 oz) of water. °· Certain foods and beverages may increase the speed at which   food moves through the gastrointestinal (GI) tract. These foods and beverages should be avoided and include: °· Caffeinated and alcoholic beverages. °· High-fiber foods, such as raw fruits and vegetables, nuts, seeds, and whole grain breads and cereals. °· Foods and beverages sweetened with sugar alcohols, such as xylitol, sorbitol, and mannitol. °· Some foods may be well tolerated and may help thicken stool including: °· Starchy foods, such as rice, toast, pasta, low-sugar cereal, oatmeal, grits, baked potatoes, crackers, and  bagels. °· Bananas. °· Applesauce. °· Add probiotic-rich foods to help increase healthy bacteria in the GI tract, such as yogurt and fermented milk products. °· Wash your hands well after each diarrhea episode. °· Only take over-the-counter or prescription medicines as directed by your caregiver. °· Take a warm bath to relieve any burning or pain from frequent diarrhea episodes. °SEEK IMMEDIATE MEDICAL CARE IF:  °· You are unable to keep fluids down. °· You have persistent vomiting. °· You have blood in your stool, or your stools are black and tarry. °· You do not urinate in 6 8 hours, or there is only a small amount of very dark urine. °· You have abdominal pain that increases or localizes. °· You have weakness, dizziness, confusion, or lightheadedness. °· You have a severe headache. °· Your diarrhea gets worse or does not get better. °· You have a fever or persistent symptoms for more than 2 3 days. °· You have a fever and your symptoms suddenly get worse. °MAKE SURE YOU:  °· Understand these instructions. °· Will watch your condition. °· Will get help right away if you are not doing well or get worse. °Document Released: 08/16/2002 Document Revised: 08/12/2012 Document Reviewed: 05/03/2012 °[employer]® Patient Information ©2014 [employer]. ° °

## 2014-02-02 ENCOUNTER — Emergency Department: Payer: Self-pay | Admitting: Emergency Medicine

## 2014-02-02 LAB — COMPREHENSIVE METABOLIC PANEL
Albumin: 3.7 g/dL (ref 3.4–5.0)
Alkaline Phosphatase: 118 U/L — ABNORMAL HIGH
Anion Gap: 8 (ref 7–16)
BUN: 17 mg/dL (ref 7–18)
Bilirubin,Total: 0.4 mg/dL (ref 0.2–1.0)
CREATININE: 0.61 mg/dL (ref 0.60–1.30)
Calcium, Total: 9.8 mg/dL (ref 8.5–10.1)
Chloride: 105 mmol/L (ref 98–107)
Co2: 25 mmol/L (ref 21–32)
EGFR (African American): >60
Glucose: 128 mg/dL — ABNORMAL HIGH (ref 65–99)
Osmolality: 279 (ref 275–301)
Potassium: 4.1 mmol/L (ref 3.5–5.1)
SGOT(AST): 20 U/L (ref 15–37)
SGPT (ALT): 26 U/L (ref 12–78)
Sodium: 138 mmol/L (ref 136–145)
Total Protein: 7.7 g/dL (ref 6.4–8.2)

## 2014-02-02 LAB — CBC
HCT: 43.4 % (ref 35.0–47.0)
HGB: 14.8 g/dL (ref 12.0–16.0)
MCH: 31.6 pg (ref 26.0–34.0)
MCHC: 34.2 g/dL (ref 32.0–36.0)
MCV: 92 fL (ref 80–100)
Platelet: 188 10*3/uL (ref 150–440)
RBC: 4.7 10*6/uL (ref 3.80–5.20)
RDW: 13.9 % (ref 11.5–14.5)
WBC: 8.2 10*3/uL (ref 3.6–11.0)

## 2014-02-02 LAB — URINALYSIS, COMPLETE
BILIRUBIN, UR: NEGATIVE
Blood: NEGATIVE
Glucose,UR: NEGATIVE mg/dL (ref 0–75)
Ketone: NEGATIVE
Nitrite: NEGATIVE
Ph: 7 (ref 4.5–8.0)
Protein: NEGATIVE
RBC,UR: 1 /HPF (ref 0–5)
Specific Gravity: 1.004 (ref 1.003–1.030)
Squamous Epithelial: 14
WBC UR: 12 /HPF (ref 0–5)

## 2014-02-03 LAB — URINE CULTURE

## 2014-02-26 ENCOUNTER — Emergency Department: Payer: Self-pay | Admitting: Emergency Medicine

## 2014-02-26 LAB — COMPREHENSIVE METABOLIC PANEL
Albumin: 4 g/dL (ref 3.4–5.0)
Alkaline Phosphatase: 110 U/L
Anion Gap: 5 — ABNORMAL LOW (ref 7–16)
BILIRUBIN TOTAL: 0.3 mg/dL (ref 0.2–1.0)
BUN: 13 mg/dL (ref 7–18)
Calcium, Total: 9.8 mg/dL (ref 8.5–10.1)
Chloride: 107 mmol/L (ref 98–107)
Co2: 28 mmol/L (ref 21–32)
Creatinine: 0.69 mg/dL (ref 0.60–1.30)
EGFR (African American): >60
GLUCOSE: 143 mg/dL — AB (ref 65–99)
OSMOLALITY: 282 (ref 275–301)
POTASSIUM: 4 mmol/L (ref 3.5–5.1)
SGOT(AST): 13 U/L — ABNORMAL LOW (ref 15–37)
SGPT (ALT): 21 U/L (ref 12–78)
Sodium: 140 mmol/L (ref 136–145)
TOTAL PROTEIN: 8 g/dL (ref 6.4–8.2)

## 2014-02-26 LAB — CBC WITH DIFFERENTIAL/PLATELET
Basophil #: 0.1 10*3/uL (ref 0.0–0.1)
Basophil %: 1 %
EOS ABS: 0.1 10*3/uL (ref 0.0–0.7)
Eosinophil %: 1.2 %
HCT: 46.9 % (ref 35.0–47.0)
HGB: 15.5 g/dL (ref 12.0–16.0)
LYMPHS PCT: 34 %
Lymphocyte #: 4.1 10*3/uL — ABNORMAL HIGH (ref 1.0–3.6)
MCH: 31 pg (ref 26.0–34.0)
MCHC: 33.1 g/dL (ref 32.0–36.0)
MCV: 94 fL (ref 80–100)
Monocyte #: 1 x10 3/mm — ABNORMAL HIGH (ref 0.2–0.9)
Monocyte %: 8 %
NEUTROS PCT: 55.8 %
Neutrophil #: 6.7 10*3/uL — ABNORMAL HIGH (ref 1.4–6.5)
Platelet: 202 10*3/uL (ref 150–440)
RBC: 5 10*6/uL (ref 3.80–5.20)
RDW: 14.2 % (ref 11.5–14.5)
WBC: 12 10*3/uL — ABNORMAL HIGH (ref 3.6–11.0)

## 2014-02-26 LAB — URINALYSIS, COMPLETE
Bilirubin,UR: NEGATIVE
Blood: NEGATIVE
Glucose,UR: >=500 mg/dL (ref 0–75)
Ketone: NEGATIVE
NITRITE: NEGATIVE
PH: 6 (ref 4.5–8.0)
Protein: NEGATIVE
Specific Gravity: 1.016 (ref 1.003–1.030)
Squamous Epithelial: 4

## 2014-02-26 LAB — LIPASE, BLOOD: LIPASE: 168 U/L (ref 73–393)

## 2014-03-04 DIAGNOSIS — M533 Sacrococcygeal disorders, not elsewhere classified: Secondary | ICD-10-CM | POA: Insufficient documentation

## 2014-03-30 ENCOUNTER — Emergency Department: Payer: Self-pay | Admitting: Internal Medicine

## 2014-03-30 LAB — COMPREHENSIVE METABOLIC PANEL
ALBUMIN: 3.8 g/dL (ref 3.4–5.0)
ALK PHOS: 125 U/L — AB
ALT: 29 U/L
AST: 23 U/L (ref 15–37)
Anion Gap: 8 (ref 7–16)
BILIRUBIN TOTAL: 0.6 mg/dL (ref 0.2–1.0)
BUN: 13 mg/dL (ref 7–18)
Calcium, Total: 9.4 mg/dL (ref 8.5–10.1)
Chloride: 105 mmol/L (ref 98–107)
Co2: 22 mmol/L (ref 21–32)
Creatinine: 0.73 mg/dL (ref 0.60–1.30)
GLUCOSE: 117 mg/dL — AB (ref 65–99)
Osmolality: 271 (ref 275–301)
Potassium: 3.7 mmol/L (ref 3.5–5.1)
SODIUM: 135 mmol/L — AB (ref 136–145)
TOTAL PROTEIN: 8.4 g/dL — AB (ref 6.4–8.2)

## 2014-03-30 LAB — CBC WITH DIFFERENTIAL/PLATELET
Basophil #: 0.1 10*3/uL (ref 0.0–0.1)
Basophil %: 0.6 %
Eosinophil #: 0.1 10*3/uL (ref 0.0–0.7)
Eosinophil %: 0.5 %
HCT: 46.7 % (ref 35.0–47.0)
HGB: 15.8 g/dL (ref 12.0–16.0)
LYMPHS PCT: 13.5 %
Lymphocyte #: 1.8 10*3/uL (ref 1.0–3.6)
MCH: 31.8 pg (ref 26.0–34.0)
MCHC: 33.8 g/dL (ref 32.0–36.0)
MCV: 94 fL (ref 80–100)
MONO ABS: 1 x10 3/mm — AB (ref 0.2–0.9)
Monocyte %: 7.3 %
Neutrophil #: 10.2 10*3/uL — ABNORMAL HIGH (ref 1.4–6.5)
Neutrophil %: 78.1 %
PLATELETS: 237 10*3/uL (ref 150–440)
RBC: 4.96 10*6/uL (ref 3.80–5.20)
RDW: 13.8 % (ref 11.5–14.5)
WBC: 13.1 10*3/uL — AB (ref 3.6–11.0)

## 2014-03-30 LAB — URINALYSIS, COMPLETE
BLOOD: NEGATIVE
Bilirubin,UR: NEGATIVE
GLUCOSE, UR: NEGATIVE mg/dL (ref 0–75)
KETONE: NEGATIVE
Nitrite: NEGATIVE
Ph: 6 (ref 4.5–8.0)
Protein: NEGATIVE
Specific Gravity: 1.015 (ref 1.003–1.030)
Squamous Epithelial: <1
WBC UR: 14 /HPF (ref 0–5)

## 2014-03-30 LAB — LIPASE, BLOOD: Lipase: 274 U/L (ref 73–393)

## 2014-04-01 LAB — URINE CULTURE

## 2014-05-08 ENCOUNTER — Emergency Department (HOSPITAL_COMMUNITY): Payer: Self-pay

## 2014-05-08 ENCOUNTER — Encounter (HOSPITAL_COMMUNITY): Payer: Self-pay | Admitting: Emergency Medicine

## 2014-05-08 ENCOUNTER — Emergency Department (HOSPITAL_COMMUNITY)
Admission: EM | Admit: 2014-05-08 | Discharge: 2014-05-08 | Disposition: A | Discharge status: Home / Self Care | Payer: Self-pay | Attending: Emergency Medicine | Admitting: Emergency Medicine

## 2014-05-08 DIAGNOSIS — F17200 Nicotine dependence, unspecified, uncomplicated: Secondary | ICD-10-CM | POA: Insufficient documentation

## 2014-05-08 DIAGNOSIS — R10A0 Flank pain, unspecified side: Secondary | ICD-10-CM

## 2014-05-08 DIAGNOSIS — R109 Unspecified abdominal pain: Secondary | ICD-10-CM | POA: Insufficient documentation

## 2014-05-08 DIAGNOSIS — Z79899 Other long term (current) drug therapy: Secondary | ICD-10-CM | POA: Insufficient documentation

## 2014-05-08 DIAGNOSIS — M545 Low back pain, unspecified: Secondary | ICD-10-CM | POA: Insufficient documentation

## 2014-05-08 DIAGNOSIS — Z880 Allergy status to penicillin: Secondary | ICD-10-CM | POA: Insufficient documentation

## 2014-05-08 DIAGNOSIS — R1110 Vomiting, unspecified: Secondary | ICD-10-CM | POA: Insufficient documentation

## 2014-05-08 DIAGNOSIS — E669 Obesity, unspecified: Secondary | ICD-10-CM | POA: Insufficient documentation

## 2014-05-08 DIAGNOSIS — N390 Urinary tract infection, site not specified: Secondary | ICD-10-CM

## 2014-05-08 LAB — URINE MICROSCOPIC-ADD ON

## 2014-05-08 LAB — CBC WITH DIFFERENTIAL/PLATELET
BASOS PCT: 0 % (ref 0–1)
Basophils Absolute: 0 10*3/uL (ref 0.0–0.1)
Eosinophils Absolute: 0.1 10*3/uL (ref 0.0–0.7)
Eosinophils Relative: 1 % (ref 0–5)
HCT: 45.7 % (ref 36.0–46.0)
Hemoglobin: 15.8 g/dL — ABNORMAL HIGH (ref 12.0–15.0)
LYMPHS PCT: 35 % (ref 12–46)
Lymphs Abs: 2.8 10*3/uL (ref 0.7–4.0)
MCH: 32.3 pg (ref 26.0–34.0)
MCHC: 34.6 g/dL (ref 30.0–36.0)
MCV: 93.5 fL (ref 78.0–100.0)
MONO ABS: 0.6 10*3/uL (ref 0.1–1.0)
Monocytes Relative: 8 % (ref 3–12)
NEUTROS PCT: 56 % (ref 43–77)
Neutro Abs: 4.5 10*3/uL (ref 1.7–7.7)
PLATELETS: 186 10*3/uL (ref 150–400)
RBC: 4.89 MIL/uL (ref 3.87–5.11)
RDW: 13 % (ref 11.5–15.5)
WBC: 8 10*3/uL (ref 4.0–10.5)

## 2014-05-08 LAB — URINALYSIS, ROUTINE W REFLEX MICROSCOPIC
BILIRUBIN URINE: NEGATIVE
Glucose, UA: NEGATIVE mg/dL
Hgb urine dipstick: NEGATIVE
Ketones, ur: NEGATIVE mg/dL
NITRITE: NEGATIVE
PROTEIN: NEGATIVE mg/dL
SPECIFIC GRAVITY, URINE: 1.011 (ref 1.005–1.030)
UROBILINOGEN UA: 0.2 mg/dL (ref 0.0–1.0)
pH: 8 (ref 5.0–8.0)

## 2014-05-08 LAB — COMPREHENSIVE METABOLIC PANEL
ALBUMIN: 4.1 g/dL (ref 3.5–5.2)
ALK PHOS: 95 U/L (ref 39–117)
ALT: 24 U/L (ref 0–35)
ANION GAP: 14 (ref 5–15)
AST: 19 U/L (ref 0–37)
BUN: 12 mg/dL (ref 6–23)
CHLORIDE: 103 meq/L (ref 96–112)
CO2: 24 mEq/L (ref 19–32)
CREATININE: 0.62 mg/dL (ref 0.50–1.10)
Calcium: 10.1 mg/dL (ref 8.4–10.5)
GFR calc Af Amer: >90 mL/min (ref >90)
GFR calc non Af Amer: >90 mL/min (ref >90)
GLUCOSE: 118 mg/dL — AB (ref 70–99)
Potassium: 3.9 mEq/L (ref 3.7–5.3)
Sodium: 141 mEq/L (ref 137–147)
Total Bilirubin: 0.4 mg/dL (ref 0.3–1.2)
Total Protein: 7.7 g/dL (ref 6.0–8.3)

## 2014-05-08 LAB — LIPASE, BLOOD: LIPASE: 31 U/L (ref 11–59)

## 2014-05-08 MED ORDER — SODIUM CHLORIDE 0.9 % IV SOLN
1000.0000 mL | INTRAVENOUS | Status: DC
  Administered 2014-05-08: 1000 mL via INTRAVENOUS

## 2014-05-08 MED ORDER — SODIUM CHLORIDE 0.9 % IV SOLN
1000.0000 mL | Freq: Once | INTRAVENOUS | Status: AC
  Administered 2014-05-08: 1000 mL via INTRAVENOUS

## 2014-05-08 MED ORDER — ONDANSETRON HCL 4 MG/2ML IJ SOLN
4.0000 mg | Freq: Once | INTRAMUSCULAR | Status: AC
  Administered 2014-05-08: 4 mg via INTRAVENOUS
  Filled 2014-05-08: qty 2

## 2014-05-08 MED ORDER — HYDROMORPHONE HCL PF 1 MG/ML IJ SOLN
1.0000 mg | INTRAMUSCULAR | Status: AC | PRN
  Administered 2014-05-08 (×3): 1 mg via INTRAVENOUS
  Filled 2014-05-08 (×3): qty 1

## 2014-05-08 MED ORDER — CIPROFLOXACIN IN D5W 400 MG/200ML IV SOLN
400.0000 mg | Freq: Once | INTRAVENOUS | Status: AC
  Administered 2014-05-08: 400 mg via INTRAVENOUS
  Filled 2014-05-08: qty 200

## 2014-05-08 MED ORDER — DIPHENHYDRAMINE HCL 12.5 MG/5ML PO ELIX
12.5000 mg | ORAL_SOLUTION | Freq: Four times a day (QID) | ORAL | Status: DC | PRN
  Administered 2014-05-08: 12.5 mg via ORAL
  Filled 2014-05-08: qty 5

## 2014-05-08 MED ORDER — CIPROFLOXACIN HCL 500 MG PO TABS: 500.0000 mg | ORAL_TABLET | Freq: Two times a day (BID) | ORAL | Status: DC

## 2014-05-08 NOTE — ED Provider Notes (Signed)
CSN: [id number]     Arrival date & time 05/08/14  1057 History   First MD Initiated Contact with Patient 05/08/14 1145     Chief Complaint  Patient presents with  . Flank Pain  . Back Pain    HPI Pt has a history of kidney stones.  She states she has had surgery in the past for kidney stones.  She started having pain in her right flank and lower back on Friday.  She has had multiple episodes of vomiting.  She tried 800 mg motrin twice on Friday without relief.  The symptoms have persisted.  She has had several episodes of diarrhea and urinary frequency and urgency.  No fevers.    No recent travel. Past Medical History  Diagnosis Date  . Hypertension   . Hepatitis C   . Sciatica   . Kidney stones    Past Surgical History  Procedure Laterality Date  . Abdominal hysterectomy    . Sinus exploration    . Lithotripsy    . Kidney stone surgery Right 02/12/12  . Kidney stone surgery Left 07/15/2012  . Oophorectomy     Family History  Problem Relation Age of Onset  . Heart failure Mother   . Diabetes Father   . Cancer Sister    History  Substance Use Topics  . Smoking status: Current Every Day Smoker -- 0.50 packs/day for 1.5 years    Types: Cigarettes  . Smokeless tobacco: Never Used  . Alcohol Use: No   OB History   Grav Para Term Preterm Abortions TAB SAB Ect Mult Living                 Review of Systems  All other systems reviewed and are negative.     Allergies  Aspirin; Codeine; Compazine; Ivp dye; Ketorolac tromethamine; Norco; Penicillins; Reglan; Tegretol; Toradol; and Tramadol  Home Medications   Prior to Admission medications   Medication Sig Start Date End Date Taking? Authorizing Provider  FLUoxetine (PROZAC) 40 MG capsule Take 40 mg by mouth every evening.    Yes Historical Provider, MD  hydrochlorothiazide (HYDRODIURIL) 25 MG tablet Take 25 mg by mouth every morning.    Yes Historical Provider, MD  losartan (COZAAR) 50 MG tablet Take 50 mg by mouth  daily.   Yes Historical Provider, MD  omeprazole (PRILOSEC) 40 MG capsule Take 40 mg by mouth every evening.    Yes Historical Provider, MD  promethazine (PHENERGAN) 25 MG tablet Take 25 mg by mouth every 6 (six) hours as needed for nausea.   Yes Historical Provider, MD  zolpidem (AMBIEN CR) 12.5 MG CR tablet Take 12.5 mg by mouth at bedtime as needed for sleep.   Yes Historical Provider, MD   BP 153/96  Pulse 89  Temp(Src) 98.1 F (36.7 C) (Oral)  Resp 18  SpO2 99% Physical Exam  Nursing note and vitals reviewed. Constitutional:  Obese  HENT:  Head: Normocephalic and atraumatic.  Right Ear: External ear normal.  Left Ear: External ear normal.  Eyes: Conjunctivae are normal. Right eye exhibits no discharge. Left eye exhibits no discharge. No scleral icterus.  Neck: Neck supple. No tracheal deviation present.  Cardiovascular: Normal rate, regular rhythm and intact distal pulses.   Pulmonary/Chest: Effort normal and breath sounds normal. No stridor. No respiratory distress. She has no wheezes. She has no rales.  Abdominal: Soft. Bowel sounds are normal. She exhibits no distension. There is tenderness in the right upper quadrant and right lower  quadrant. There is CVA tenderness (right-sided). There is no rigidity, no rebound and no guarding.  Musculoskeletal: She exhibits no edema and no tenderness.  Neurological: She is alert. She has normal strength. No cranial nerve deficit (no facial droop, extraocular movements intact, no slurred speech) or sensory deficit. She exhibits normal muscle tone. She displays no seizure activity. Coordination normal.  Skin: Skin is warm and dry. No rash noted. She is not diaphoretic.  Psychiatric: She has a normal mood and affect.    ED Course  Procedures (including critical care time) Labs Review Labs Reviewed  CBC WITH DIFFERENTIAL - Abnormal; Notable for the following:    Hemoglobin 15.8 (*)    All other components within normal limits   COMPREHENSIVE METABOLIC PANEL - Abnormal; Notable for the following:    Glucose, Bld 118 (*)    All other components within normal limits  URINALYSIS, ROUTINE W REFLEX MICROSCOPIC - Abnormal; Notable for the following:    APPearance CLOUDY (*)    Leukocytes, UA TRACE (*)    All other components within normal limits  URINE MICROSCOPIC-ADD ON - Abnormal; Notable for the following:    Squamous Epithelial / LPF MANY (*)    Bacteria, UA FEW (*)    All other components within normal limits  LIPASE, BLOOD    Imaging Review Dg Abd 2 Views  05/08/2014   CLINICAL DATA:  Pain.  EXAM: ABDOMEN - 2 VIEW  COMPARISON:  01/31/2014 .  FINDINGS: Soft tissue structures are unremarkable. Gas pattern is nonspecific. No free air. Calcific density noted over right kidney. This is most consistent with nephrolithiasis. This is stable. Pelvic calcifications are noted appear these may represent phleboliths. Distal ureteral stones cannot be excluded. Degenerative changes lumbar spine and both hips.  IMPRESSION: 1. No acute abnormality. 2. Right nephrolithiasis.   Electronically Signed   By: Maisie Fus  Register   On: 05/08/2014 12:53    Medications  0.9 %  sodium chloride infusion (1,000 mLs Intravenous New Bag/Given 05/08/14 1228)    Followed by  0.9 %  sodium chloride infusion (1,000 mLs Intravenous New Bag/Given 05/08/14 1228)  diphenhydrAMINE (BENADRYL) 12.5 MG/5ML elixir 12.5 mg (12.5 mg Oral Given 05/08/14 1213)  ciprofloxacin (CIPRO) IVPB 400 mg (not administered)  ondansetron (ZOFRAN) injection 4 mg (4 mg Intravenous Given 05/08/14 1213)  HYDROmorphone (DILAUDID) injection 1 mg (1 mg Intravenous Given 05/08/14 1403)     MDM  Patient presents with recurrent episodes of flank pain and back pain associated with vomiting and diarrhea. Patient states she's had history of recurrent kidney stones. I reviewed her prior records. Multiple CT scans in the past that showed a stable renal stones without ureterolithiasis.   Plan labs, x-rays pain management and re-assessment   Final diagnoses:  UTI (lower urinary tract infection)  Flank pain, acute    Patient's urinalysis is consistent with a urinary tract infection. She does not have any hematuria. Plain films do not suggest a ureteral stone. Patient has multiple medication allergies including NSAIDs. Patient's vital signs are unremarkable. I doubt pyelonephritis. Discharge home on Cipro. Follow up with primary care doctor.   Linwood Dibbles, MD 05/08/14 [id number]

## 2014-05-08 NOTE — ED Notes (Signed)
Pt c/o back pain and flank pain since Friday, Pt also has n/v/d. Pt states she went to restroom 20 times

## 2014-05-08 NOTE — Discharge Instructions (Signed)
Abdominal Pain °Many things can cause abdominal pain. Usually, abdominal pain is not caused by a disease and will improve without treatment. It can often be observed and treated at home. Your health care provider will do a physical exam and possibly order blood tests and X-rays to help determine the seriousness of your pain. However, in many cases, more time must pass before a clear cause of the pain can be found. Before that point, your health care provider may not know if you need more testing or further treatment. °HOME CARE INSTRUCTIONS  °Monitor your abdominal pain for any changes. The following actions may help to alleviate any discomfort you are experiencing: °· Only take over-the-counter or prescription medicines as directed by your health care provider. °· Do not take laxatives unless directed to do so by your health care provider. °· Try a clear liquid diet (broth, tea, or water) as directed by your health care provider. Slowly move to a bland diet as tolerated. °SEEK MEDICAL CARE IF: °· You have unexplained abdominal pain. °· You have abdominal pain associated with nausea or diarrhea. °· You have pain when you urinate or have a bowel movement. °· You experience abdominal pain that wakes you in the night. °· You have abdominal pain that is worsened or improved by eating food. °· You have abdominal pain that is worsened with eating fatty foods. °· You have a fever. °SEEK IMMEDIATE MEDICAL CARE IF:  °· Your pain does not go away within 2 hours. °· You keep throwing up (vomiting). °· Your pain is felt only in portions of the abdomen, such as the right side or the left lower portion of the abdomen. °· You pass bloody or black tarry stools. °MAKE SURE YOU: °· Understand these instructions.   °· Will watch your condition.   °· Will get help right away if you are not doing well or get worse.   °Document Released: 06/05/2005 Document Revised: 08/31/2013 Document Reviewed: 05/05/2013 °ExitCare® Patient Information  ©2015 [employer]. This information is not intended to replace advice given to you by your health care provider. Make sure you discuss any questions you have with your health care provider. ° °Urinary Tract Infection °A urinary tract infection (UTI) can occur any place along the urinary tract. The tract includes the kidneys, ureters, bladder, and urethra. A type of germ called bacteria often causes a UTI. UTIs are often helped with antibiotic medicine.  °HOME CARE  °· If given, take antibiotics as told by your doctor. Finish them even if you start to feel better. °· Drink enough fluids to keep your pee (urine) clear or pale yellow. °· Avoid tea, drinks with caffeine, and bubbly (carbonated) drinks. °· Pee often. Avoid holding your pee in for a long time. °· Pee before and after having sex (intercourse). °· Wipe from front to back after you poop (bowel movement) if you are a woman. Use each tissue only once. °GET HELP RIGHT AWAY IF:  °· You have back pain. °· You have lower belly (abdominal) pain. °· You have chills. °· You feel sick to your stomach (nauseous). °· You throw up (vomit). °· Your burning or discomfort with peeing does not go away. °· You have a fever. °· Your symptoms are not better in 3 days. °MAKE SURE YOU:  °· Understand these instructions. °· Will watch your condition. °· Will get help right away if you are not doing well or get worse. °Document Released: 02/12/2008 Document Revised: 05/20/2012 Document Reviewed: 03/26/2012 °ExitCare® Patient Information ©  2015 [employer]. This information is not intended to replace advice given to you by your health care provider. Make sure you discuss any questions you have with your health care provider. ° °

## 2014-05-09 LAB — URINE CULTURE

## 2014-08-09 ENCOUNTER — Encounter (HOSPITAL_COMMUNITY): Payer: Self-pay

## 2014-08-09 ENCOUNTER — Emergency Department (HOSPITAL_COMMUNITY): Payer: Self-pay

## 2014-08-09 ENCOUNTER — Emergency Department (HOSPITAL_COMMUNITY)
Admission: EM | Admit: 2014-08-09 | Discharge: 2014-08-10 | Disposition: A | Discharge status: Home / Self Care | Payer: Self-pay | Attending: Emergency Medicine | Admitting: Emergency Medicine

## 2014-08-09 DIAGNOSIS — Z8619 Personal history of other infectious and parasitic diseases: Secondary | ICD-10-CM | POA: Insufficient documentation

## 2014-08-09 DIAGNOSIS — R55 Syncope and collapse: Secondary | ICD-10-CM | POA: Insufficient documentation

## 2014-08-09 DIAGNOSIS — Z880 Allergy status to penicillin: Secondary | ICD-10-CM | POA: Insufficient documentation

## 2014-08-09 DIAGNOSIS — Z87442 Personal history of urinary calculi: Secondary | ICD-10-CM | POA: Insufficient documentation

## 2014-08-09 DIAGNOSIS — R1084 Generalized abdominal pain: Secondary | ICD-10-CM | POA: Insufficient documentation

## 2014-08-09 DIAGNOSIS — R197 Diarrhea, unspecified: Secondary | ICD-10-CM | POA: Insufficient documentation

## 2014-08-09 DIAGNOSIS — R630 Anorexia: Secondary | ICD-10-CM | POA: Insufficient documentation

## 2014-08-09 DIAGNOSIS — Z7951 Long term (current) use of inhaled steroids: Secondary | ICD-10-CM | POA: Insufficient documentation

## 2014-08-09 DIAGNOSIS — R531 Weakness: Secondary | ICD-10-CM | POA: Insufficient documentation

## 2014-08-09 DIAGNOSIS — R42 Dizziness and giddiness: Secondary | ICD-10-CM | POA: Insufficient documentation

## 2014-08-09 DIAGNOSIS — I10 Essential (primary) hypertension: Secondary | ICD-10-CM | POA: Insufficient documentation

## 2014-08-09 DIAGNOSIS — Z90710 Acquired absence of both cervix and uterus: Secondary | ICD-10-CM | POA: Insufficient documentation

## 2014-08-09 DIAGNOSIS — M5430 Sciatica, unspecified side: Secondary | ICD-10-CM | POA: Insufficient documentation

## 2014-08-09 DIAGNOSIS — Z720 Tobacco use: Secondary | ICD-10-CM | POA: Insufficient documentation

## 2014-08-09 DIAGNOSIS — Z79899 Other long term (current) drug therapy: Secondary | ICD-10-CM | POA: Insufficient documentation

## 2014-08-09 DIAGNOSIS — R109 Unspecified abdominal pain: Secondary | ICD-10-CM

## 2014-08-09 LAB — CBC WITH DIFFERENTIAL/PLATELET
BASOS ABS: 0 10*3/uL (ref 0.0–0.1)
BASOS PCT: 0 % (ref 0–1)
EOS ABS: 0.1 10*3/uL (ref 0.0–0.7)
Eosinophils Relative: 2 % (ref 0–5)
HCT: 45 % (ref 36.0–46.0)
HEMOGLOBIN: 14.8 g/dL (ref 12.0–15.0)
Lymphocytes Relative: 40 % (ref 12–46)
Lymphs Abs: 3.5 10*3/uL (ref 0.7–4.0)
MCH: 31.6 pg (ref 26.0–34.0)
MCHC: 32.9 g/dL (ref 30.0–36.0)
MCV: 96.2 fL (ref 78.0–100.0)
MONOS PCT: 9 % (ref 3–12)
Monocytes Absolute: 0.8 10*3/uL (ref 0.1–1.0)
NEUTROS ABS: 4.4 10*3/uL (ref 1.7–7.7)
NEUTROS PCT: 49 % (ref 43–77)
Platelets: 207 10*3/uL (ref 150–400)
RBC: 4.68 MIL/uL (ref 3.87–5.11)
RDW: 12.9 % (ref 11.5–15.5)
WBC: 8.8 10*3/uL (ref 4.0–10.5)

## 2014-08-09 LAB — URINE MICROSCOPIC-ADD ON

## 2014-08-09 LAB — COMPREHENSIVE METABOLIC PANEL
ALBUMIN: 3.7 g/dL (ref 3.5–5.2)
ALK PHOS: 105 U/L (ref 39–117)
ALT: 24 U/L (ref 0–35)
ANION GAP: 13 (ref 5–15)
AST: 20 U/L (ref 0–37)
BUN: 9 mg/dL (ref 6–23)
CHLORIDE: 99 meq/L (ref 96–112)
CO2: 29 mEq/L (ref 19–32)
Calcium: 10.3 mg/dL (ref 8.4–10.5)
Creatinine, Ser: 0.66 mg/dL (ref 0.50–1.10)
GFR calc Af Amer: >90 mL/min (ref >90)
GFR calc non Af Amer: >90 mL/min (ref >90)
Glucose, Bld: 117 mg/dL — ABNORMAL HIGH (ref 70–99)
POTASSIUM: 4.2 meq/L (ref 3.7–5.3)
Sodium: 141 mEq/L (ref 137–147)
TOTAL PROTEIN: 7.8 g/dL (ref 6.0–8.3)

## 2014-08-09 LAB — URINALYSIS, ROUTINE W REFLEX MICROSCOPIC
Bilirubin Urine: NEGATIVE
GLUCOSE, UA: NEGATIVE mg/dL
Hgb urine dipstick: NEGATIVE
KETONES UR: NEGATIVE mg/dL
Nitrite: NEGATIVE
PROTEIN: NEGATIVE mg/dL
Specific Gravity, Urine: 1.008 (ref 1.005–1.030)
UROBILINOGEN UA: 0.2 mg/dL (ref 0.0–1.0)
pH: 6 (ref 5.0–8.0)

## 2014-08-09 LAB — LIPASE, BLOOD: LIPASE: 23 U/L (ref 11–59)

## 2014-08-09 MED ORDER — CIPROFLOXACIN HCL 500 MG PO TABS: 500.0000 mg | ORAL_TABLET | Freq: Two times a day (BID) | ORAL | Status: DC

## 2014-08-09 MED ORDER — CIPROFLOXACIN HCL 500 MG PO TABS
500.0000 mg | ORAL_TABLET | Freq: Once | ORAL | Status: AC
  Administered 2014-08-10: 500 mg via ORAL
  Filled 2014-08-09: qty 1

## 2014-08-09 MED ORDER — MORPHINE SULFATE 4 MG/ML IJ SOLN
4.0000 mg | Freq: Once | INTRAMUSCULAR | Status: AC
Start: 2014-08-09 — End: 2014-08-09
  Administered 2014-08-09: 4 mg via INTRAVENOUS
  Filled 2014-08-09: qty 1

## 2014-08-09 MED ORDER — MORPHINE SULFATE 4 MG/ML IJ SOLN
4.0000 mg | Freq: Once | INTRAMUSCULAR | Status: AC
  Administered 2014-08-09: 4 mg via INTRAVENOUS
  Filled 2014-08-09: qty 1

## 2014-08-09 MED ORDER — PHENAZOPYRIDINE HCL 200 MG PO TABS: 200.0000 mg | ORAL_TABLET | Freq: Three times a day (TID) | ORAL | Status: DC

## 2014-08-09 MED ORDER — PROMETHAZINE HCL 25 MG/ML IJ SOLN
12.5000 mg | Freq: Once | INTRAMUSCULAR | Status: AC
  Administered 2014-08-09: 25 mg via INTRAVENOUS
  Filled 2014-08-09: qty 1

## 2014-08-09 MED ORDER — ONDANSETRON HCL 4 MG/2ML IJ SOLN
4.0000 mg | Freq: Once | INTRAMUSCULAR | Status: AC
  Administered 2014-08-09: 4 mg via INTRAVENOUS
  Filled 2014-08-09: qty 2

## 2014-08-09 NOTE — ED Provider Notes (Signed)
CSN: [id number]     Arrival date & time 08/09/14  1801 History   First MD Initiated Contact with Patient 08/09/14 2105     Chief Complaint  Patient presents with  . Abdominal Pain  . Dizziness   HPI Pt has had two episodes of near syncope since this morning.  She will get up and walk and start to feel weak and dizzy.  When she looks around she sees black spots.  No focal weakness.  She also started to have some abdominal pain.  Her stomach is in knots and she has had diarrhea, watery brown for the last few days.  NO fevers.  Decreased appetitie.  NO foreign travel.  No dysuria.  She has not been on abx. Past Medical History  Diagnosis Date  . Hypertension   . Hepatitis C   . Sciatica   . Kidney stones    Past Surgical History  Procedure Laterality Date  . Abdominal hysterectomy    . Sinus exploration    . Lithotripsy    . Kidney stone surgery Right 02/12/12  . Kidney stone surgery Left 07/15/2012  . Oophorectomy     Family History  Problem Relation Age of Onset  . Heart failure Mother   . Diabetes Father   . Cancer Sister    History  Substance Use Topics  . Smoking status: Current Every Day Smoker -- 0.50 packs/day for 1.5 years    Types: Cigarettes  . Smokeless tobacco: Never Used  . Alcohol Use: No   OB History    No data available     Review of Systems    Allergies  Aspirin; Codeine; Compazine; Ivp dye; Ketorolac tromethamine; Norco; Penicillins; Reglan; Tegretol; Toradol; and Tramadol  Home Medications   Prior to Admission medications   Medication Sig Start Date End Date Taking? Authorizing Provider  alprazolam Prudy Feeler(XANAX) 2 MG tablet Take 2 mg by mouth 2 (two) times daily.   Yes Historical Provider, MD  aspirin-acetaminophen-caffeine (EXCEDRIN MIGRAINE) [phone] MG per tablet Take 1 tablet by mouth every 6 (six) hours as needed for headache or migraine.   Yes Historical Provider, MD  FLUoxetine (PROZAC) 40 MG capsule Take 40 mg by mouth every morning.    Yes  Historical Provider, MD  fluticasone (FLONASE) 50 MCG/ACT nasal spray Place 2 sprays into both nostrils daily. 04/11/14  Yes Historical Provider, MD  hydrochlorothiazide (HYDRODIURIL) 25 MG tablet Take 25 mg by mouth every morning.    Yes Historical Provider, MD  losartan (COZAAR) 50 MG tablet Take 50 mg by mouth daily.   Yes Historical Provider, MD  omeprazole (PRILOSEC) 40 MG capsule Take 40 mg by mouth every morning.    Yes Historical Provider, MD  pregabalin (LYRICA) 75 MG capsule Take 75-150 mg by mouth 2 (two) times daily. Take 2 tablets = 150 mg in the morning and take 1 tablet = 75mg  at night 07/08/14  Yes Historical Provider, MD  promethazine (PHENERGAN) 25 MG tablet Take 25 mg by mouth every 6 (six) hours as needed for nausea.   Yes Historical Provider, MD  zolpidem (AMBIEN) 10 MG tablet Take 10 mg by mouth at bedtime as needed for sleep.   Yes Historical Provider, MD  ciprofloxacin (CIPRO) 500 MG tablet Take 1 tablet (500 mg total) by mouth 2 (two) times daily. 08/09/14   Linwood DibblesJon Ociel Retherford, MD  phenazopyridine (PYRIDIUM) 200 MG tablet Take 1 tablet (200 mg total) by mouth 3 (three) times daily. 08/09/14   Linwood DibblesJon Jashley Yellin, MD  BP 111/71 mmHg  Pulse 78  Temp(Src) 98.5 F (36.9 C) (Oral)  Resp 18  SpO2 99% Physical Exam  Constitutional: She appears well-developed and well-nourished. No distress.  HENT:  Head: Normocephalic and atraumatic.  Right Ear: External ear normal.  Left Ear: External ear normal.  Eyes: Conjunctivae are normal. Right eye exhibits no discharge. Left eye exhibits no discharge. No scleral icterus.  Neck: Neck supple. No tracheal deviation present.  Cardiovascular: Normal rate, regular rhythm and intact distal pulses.   Pulmonary/Chest: Effort normal and breath sounds normal. No stridor. No respiratory distress. She has no wheezes. She has no rales.  Abdominal: Soft. Bowel sounds are normal. She exhibits no distension. There is generalized tenderness. There is no rebound and no  guarding.  Musculoskeletal: She exhibits no edema or tenderness.  Neurological: She is alert. She has normal strength. No cranial nerve deficit (no facial droop, extraocular movements intact, no slurred speech) or sensory deficit. She exhibits normal muscle tone. She displays no seizure activity. Coordination normal.  Skin: Skin is warm and dry. No rash noted.  Psychiatric: She has a normal mood and affect.  Nursing note and vitals reviewed.   ED Course  Procedures (including critical care time) Labs Review Labs Reviewed  COMPREHENSIVE METABOLIC PANEL - Abnormal; Notable for the following:    Glucose, Bld 117 (*)    Total Bilirubin <0.2 (*)    All other components within normal limits  URINALYSIS, ROUTINE W REFLEX MICROSCOPIC - Abnormal; Notable for the following:    Leukocytes, UA SMALL (*)    All other components within normal limits  URINE MICROSCOPIC-ADD ON - Abnormal; Notable for the following:    Bacteria, UA FEW (*)    All other components within normal limits  CBC WITH DIFFERENTIAL  LIPASE, BLOOD    Imaging Review Dg Abd Acute W/chest  08/09/2014   CLINICAL DATA:  Acute onset of upper abdominal pain and vomiting for 24 hours. Weakness and dizziness. Initial encounter.  EXAM: ACUTE ABDOMEN SERIES (ABDOMEN 2 VIEW & CHEST 1 VIEW)  COMPARISON:  Chest and abdominal radiographs performed 01/31/2014, and abdominal radiographs performed 05/08/2014  FINDINGS: The lungs are well-aerated. Minimal bibasilar atelectasis is noted. Mild vascular congestion is seen. There is no evidence of pleural effusion or pneumothorax. The cardiomediastinal silhouette is within normal limits.  The visualized bowel gas pattern is unremarkable. Scattered stool and air are seen within the colon; there is no evidence of small bowel dilatation to suggest obstruction. No free intra-abdominal air is identified on the provided upright view.  No acute osseous abnormalities are seen; the sacroiliac joints are  unremarkable in appearance.  IMPRESSION: 1. Unremarkable bowel gas pattern; no free intra-abdominal air seen. 2. Minimal bibasilar atelectasis noted; mild vascular congestion seen.   Electronically Signed   By: Roanna RaiderJeffery  Chang M.D.   On: 08/09/2014 22:13     EKG Interpretation   Date/Time:  Tuesday August 09 2014 18:48:06 EST Ventricular Rate:  92 PR Interval:  180 QRS Duration: 74 QT Interval:  358 QTC Calculation: 443 R Axis:   3 Text Interpretation:  Sinus rhythm Anterior infarct, old Minimal ST  depression, inferior leads, new since last tracing Confirmed by Donelda Mailhot   MD-J, Jehan Ranganathan ([id number]) on 08/09/2014 9:27:06 PM     Medications  ciprofloxacin (CIPRO) tablet 500 mg (not administered)  ondansetron (ZOFRAN) injection 4 mg (4 mg Intravenous Given 08/09/14 2058)  morphine 4 MG/ML injection 4 mg (4 mg Intravenous Given 08/09/14 2145)  promethazine (PHENERGAN) injection  12.5 mg (25 mg Intravenous Given 08/09/14 2145)  morphine 4 MG/ML injection 4 mg (4 mg Intravenous Given 08/09/14 2315)    MDM   Final diagnoses:  Abdominal pain    UA is consistent with a UTI.  Labs otherwise unremarkable.  Old records indicate prior visits to the ED for issues with recurrent abdominal pain.  No vomiting in the ED.  Doubt obstruction. Colitis, pancreatitis.  At this time there does not appear to be any evidence of an acute emergency medical condition and the patient appears stable for discharge with appropriate outpatient follow up.    Linwood Dibbles, MD 08/10/14 0000

## 2014-08-09 NOTE — Discharge Instructions (Signed)
Abdominal Pain °Many things can cause abdominal pain. Usually, abdominal pain is not caused by a disease and will improve without treatment. It can often be observed and treated at home. Your health care provider will do a physical exam and possibly order blood tests and X-rays to help determine the seriousness of your pain. However, in many cases, more time must pass before a clear cause of the pain can be found. Before that point, your health care provider may not know if you need more testing or further treatment. °HOME CARE INSTRUCTIONS  °Monitor your abdominal pain for any changes. The following actions may help to alleviate any discomfort you are experiencing: °· Only take over-the-counter or prescription medicines as directed by your health care provider. °· Do not take laxatives unless directed to do so by your health care provider. °· Try a clear liquid diet (broth, tea, or water) as directed by your health care provider. Slowly move to a bland diet as tolerated. °SEEK MEDICAL CARE IF: °· You have unexplained abdominal pain. °· You have abdominal pain associated with nausea or diarrhea. °· You have pain when you urinate or have a bowel movement. °· You experience abdominal pain that wakes you in the night. °· You have abdominal pain that is worsened or improved by eating food. °· You have abdominal pain that is worsened with eating fatty foods. °· You have a fever. °SEEK IMMEDIATE MEDICAL CARE IF:  °· Your pain does not go away within 2 hours. °· You keep throwing up (vomiting). °· Your pain is felt only in portions of the abdomen, such as the right side or the left lower portion of the abdomen. °· You pass bloody or black tarry stools. °MAKE SURE YOU: °· Understand these instructions.   °· Will watch your condition.   °· Will get help right away if you are not doing well or get worse.   °Document Released: 06/05/2005 Document Revised: 08/31/2013 Document Reviewed: 05/05/2013 °[employer]® Patient Information  ©2015 [employer]. This information is not intended to replace advice given to you by your health care provider. Make sure you discuss any questions you have with your health care provider. °Urinary Tract Infection °Urinary tract infections (UTIs) can develop anywhere along your urinary tract. Your urinary tract is your body's drainage system for removing wastes and extra water. Your urinary tract includes two kidneys, two ureters, a bladder, and a urethra. Your kidneys are a pair of bean-shaped organs. Each kidney is about the size of your fist. They are located below your ribs, one on each side of your spine. °CAUSES °Infections are caused by microbes, which are microscopic organisms, including fungi, viruses, and bacteria. These organisms are so small that they can only be seen through a microscope. Bacteria are the microbes that most commonly cause UTIs. °SYMPTOMS  °Symptoms of UTIs may vary by age and gender of the patient and by the location of the infection. Symptoms in young women typically include a frequent and intense urge to urinate and a painful, burning feeling in the bladder or urethra during urination. Older women and men are more likely to be tired, shaky, and weak and have muscle aches and abdominal pain. A fever may mean the infection is in your kidneys. Other symptoms of a kidney infection include pain in your back or sides below the ribs, nausea, and vomiting. °DIAGNOSIS °To diagnose a UTI, your caregiver will ask you about your symptoms. Your caregiver also will ask to provide a urine sample. The urine sample   will be tested for bacteria and white blood cells. White blood cells are made by your body to help fight infection. °TREATMENT  °Typically, UTIs can be treated with medication. Because most UTIs are caused by a bacterial infection, they usually can be treated with the use of antibiotics. The choice of antibiotic and length of treatment depend on your symptoms and the type of bacteria  causing your infection. °HOME CARE INSTRUCTIONS °· If you were prescribed antibiotics, take them exactly as your caregiver instructs you. Finish the medication even if you feel better after you have only taken some of the medication. °· Drink enough water and fluids to keep your urine clear or pale yellow. °· Avoid caffeine, tea, and carbonated beverages. They tend to irritate your bladder. °· Empty your bladder often. Avoid holding urine for long periods of time. °· Empty your bladder before and after sexual intercourse. °· After a bowel movement, women should cleanse from front to back. Use each tissue only once. °SEEK MEDICAL CARE IF:  °· You have back pain. °· You develop a fever. °· Your symptoms do not begin to resolve within 3 days. °SEEK IMMEDIATE MEDICAL CARE IF:  °· You have severe back pain or lower abdominal pain. °· You develop chills. °· You have nausea or vomiting. °· You have continued burning or discomfort with urination. °MAKE SURE YOU:  °· Understand these instructions. °· Will watch your condition. °· Will get help right away if you are not doing well or get worse. °Document Released: 06/05/2005 Document Revised: 02/25/2012 Document Reviewed: 10/04/2011 °Exit[employer]® Patient Information ©2015 [employer]. This information is not intended to replace advice given to you by your health care provider. Make sure you discuss any questions you have with your health care provider. ° °

## 2014-08-09 NOTE — ED Notes (Signed)
Abdominal pain with n/v/d starting today.  No fever

## 2014-09-06 ENCOUNTER — Encounter (HOSPITAL_COMMUNITY): Payer: Self-pay | Admitting: Emergency Medicine

## 2014-09-06 ENCOUNTER — Encounter (HOSPITAL_COMMUNITY): Payer: Self-pay | Admitting: *Deleted

## 2014-09-06 ENCOUNTER — Emergency Department (HOSPITAL_COMMUNITY)
Admission: EM | Admit: 2014-09-06 | Discharge: 2014-09-06 | Discharge status: Against Medical Advice | Payer: Self-pay | Attending: Emergency Medicine | Admitting: Emergency Medicine

## 2014-09-06 ENCOUNTER — Emergency Department (HOSPITAL_COMMUNITY)
Admission: EM | Admit: 2014-09-06 | Discharge: 2014-09-07 | Disposition: A | Discharge status: Home / Self Care | Payer: Self-pay | Attending: Emergency Medicine | Admitting: Emergency Medicine

## 2014-09-06 DIAGNOSIS — W1839XA Other fall on same level, initial encounter: Secondary | ICD-10-CM | POA: Insufficient documentation

## 2014-09-06 DIAGNOSIS — N390 Urinary tract infection, site not specified: Secondary | ICD-10-CM | POA: Insufficient documentation

## 2014-09-06 DIAGNOSIS — I10 Essential (primary) hypertension: Secondary | ICD-10-CM | POA: Insufficient documentation

## 2014-09-06 DIAGNOSIS — Y998 Other external cause status: Secondary | ICD-10-CM | POA: Insufficient documentation

## 2014-09-06 DIAGNOSIS — Z79899 Other long term (current) drug therapy: Secondary | ICD-10-CM | POA: Insufficient documentation

## 2014-09-06 DIAGNOSIS — Z7951 Long term (current) use of inhaled steroids: Secondary | ICD-10-CM | POA: Insufficient documentation

## 2014-09-06 DIAGNOSIS — Y939 Activity, unspecified: Secondary | ICD-10-CM | POA: Insufficient documentation

## 2014-09-06 DIAGNOSIS — Z8619 Personal history of other infectious and parasitic diseases: Secondary | ICD-10-CM | POA: Insufficient documentation

## 2014-09-06 DIAGNOSIS — R350 Frequency of micturition: Secondary | ICD-10-CM

## 2014-09-06 DIAGNOSIS — Z720 Tobacco use: Secondary | ICD-10-CM | POA: Insufficient documentation

## 2014-09-06 DIAGNOSIS — Z792 Long term (current) use of antibiotics: Secondary | ICD-10-CM | POA: Insufficient documentation

## 2014-09-06 DIAGNOSIS — Z8739 Personal history of other diseases of the musculoskeletal system and connective tissue: Secondary | ICD-10-CM | POA: Insufficient documentation

## 2014-09-06 DIAGNOSIS — Z880 Allergy status to penicillin: Secondary | ICD-10-CM | POA: Insufficient documentation

## 2014-09-06 DIAGNOSIS — S3992XA Unspecified injury of lower back, initial encounter: Secondary | ICD-10-CM | POA: Insufficient documentation

## 2014-09-06 DIAGNOSIS — Y929 Unspecified place or not applicable: Secondary | ICD-10-CM | POA: Insufficient documentation

## 2014-09-06 DIAGNOSIS — Z87442 Personal history of urinary calculi: Secondary | ICD-10-CM | POA: Insufficient documentation

## 2014-09-06 LAB — URINE MICROSCOPIC-ADD ON

## 2014-09-06 LAB — COMPREHENSIVE METABOLIC PANEL
ALT: 19 U/L (ref 0–35)
ANION GAP: 11 (ref 5–15)
AST: 20 U/L (ref 0–37)
Albumin: 3.8 g/dL (ref 3.5–5.2)
Alkaline Phosphatase: 91 U/L (ref 39–117)
BUN: 8 mg/dL (ref 6–23)
CALCIUM: 9.6 mg/dL (ref 8.4–10.5)
CHLORIDE: 103 meq/L (ref 96–112)
CO2: 28 mmol/L (ref 19–32)
CREATININE: 0.72 mg/dL (ref 0.50–1.10)
GLUCOSE: 107 mg/dL — AB (ref 70–99)
Potassium: 3.6 mmol/L (ref 3.5–5.1)
Sodium: 142 mmol/L (ref 135–145)
Total Bilirubin: 0.4 mg/dL (ref 0.3–1.2)
Total Protein: 6.9 g/dL (ref 6.0–8.3)

## 2014-09-06 LAB — URINALYSIS, ROUTINE W REFLEX MICROSCOPIC
BILIRUBIN URINE: NEGATIVE
Glucose, UA: 250 mg/dL — AB
Hgb urine dipstick: NEGATIVE
KETONES UR: NEGATIVE mg/dL
Nitrite: NEGATIVE
PH: 6 (ref 5.0–8.0)
PROTEIN: NEGATIVE mg/dL
Specific Gravity, Urine: 1.016 (ref 1.005–1.030)
UROBILINOGEN UA: 0.2 mg/dL (ref 0.0–1.0)

## 2014-09-06 LAB — CBC WITH DIFFERENTIAL/PLATELET
BASOS ABS: 0 10*3/uL (ref 0.0–0.1)
Basophils Relative: 0 % (ref 0–1)
EOS PCT: 1 % (ref 0–5)
Eosinophils Absolute: 0.1 10*3/uL (ref 0.0–0.7)
HCT: 43.5 % (ref 36.0–46.0)
HEMOGLOBIN: 14.5 g/dL (ref 12.0–15.0)
Lymphocytes Relative: 43 % (ref 12–46)
Lymphs Abs: 3.2 10*3/uL (ref 0.7–4.0)
MCH: 31.7 pg (ref 26.0–34.0)
MCHC: 33.3 g/dL (ref 30.0–36.0)
MCV: 95.2 fL (ref 78.0–100.0)
MONO ABS: 0.7 10*3/uL (ref 0.1–1.0)
Monocytes Relative: 10 % (ref 3–12)
Neutro Abs: 3.4 10*3/uL (ref 1.7–7.7)
Neutrophils Relative %: 46 % (ref 43–77)
Platelets: 178 10*3/uL (ref 150–400)
RBC: 4.57 MIL/uL (ref 3.87–5.11)
RDW: 12.8 % (ref 11.5–15.5)
WBC: 7.5 10*3/uL (ref 4.0–10.5)

## 2014-09-06 LAB — LIPASE, BLOOD: Lipase: 34 U/L (ref 11–59)

## 2014-09-06 MED ORDER — FENTANYL CITRATE 0.05 MG/ML IJ SOLN
50.0000 ug | Freq: Once | INTRAMUSCULAR | Status: DC
  Filled 2014-09-06: qty 2

## 2014-09-06 MED ORDER — MORPHINE SULFATE 4 MG/ML IJ SOLN
4.0000 mg | Freq: Once | INTRAMUSCULAR | Status: AC
  Administered 2014-09-06: 4 mg via INTRAVENOUS
  Filled 2014-09-06: qty 1

## 2014-09-06 MED ORDER — ONDANSETRON HCL 4 MG/2ML IJ SOLN
4.0000 mg | Freq: Once | INTRAMUSCULAR | Status: AC
Start: 2014-09-06 — End: 2014-09-06
  Administered 2014-09-06: 4 mg via INTRAVENOUS
  Filled 2014-09-06: qty 2

## 2014-09-06 NOTE — ED Notes (Signed)
Pt in c/o lower abd pain and back pain, n/v/d, for the last few days, thought she had a kidney stone but this feels different

## 2014-09-06 NOTE — ED Notes (Signed)
Per pt, states she fell yesterday due to the kidney stones she has on her left side-multiple complaints-cant tell me her main issue of concern

## 2014-09-06 NOTE — ED Notes (Signed)
Pt reports fentanyl does not help and is requesting morphine or dilaudid by name. Misty StanleyLisa, [place] informed.

## 2014-09-06 NOTE — ED Notes (Signed)
LISA, PA AT BEDSIDE.

## 2014-09-06 NOTE — ED Provider Notes (Signed)
CSN: [id number]     Arrival date & time 09/06/14  1747 History   First MD Initiated Contact with Patient 09/06/14 2154     Chief Complaint  Patient presents with  . Abdominal Pain     (Consider location/radiation/quality/duration/timing/severity/associated sxs/prior Treatment) Patient is a 48 y.o. female presenting with abdominal pain. The history is provided by the patient and medical records.  Abdominal Pain   This is a 48 year old female with past medical history significant for hypertension, hepatitis C, kidney stones, presenting to the ED for abdominal pain. Patient states mostly localized to her left flank with some radiation to her left lower abdomen. States she has had some nausea but denies vomiting. States her bowel movements have been looser than normal for the past 2 weeks. She denies any melena or hematochezia. No fever or chills. Patient denies any vaginal complaints. She does note urinary frequency but denies dysuria or hematuria.  Prior abdominal surgeries include hysterectomy and oopherectomy.  Patient does have hx of frequent UTI's.  [facility]-- states she has required lithotripsy multiple times. She has follow-up appointment on September 16, 2014.  Past Medical History  Diagnosis Date  . Hypertension   . Hepatitis C   . Sciatica   . Kidney stones    Past Surgical History  Procedure Laterality Date  . Abdominal hysterectomy    . Sinus exploration    . Lithotripsy    . Kidney stone surgery Right 02/12/12  . Kidney stone surgery Left 07/15/2012  . Oophorectomy     Family History  Problem Relation Age of Onset  . Heart failure Mother   . Diabetes Father   . Cancer Sister    History  Substance Use Topics  . Smoking status: Current Every Day Smoker -- 0.50 packs/day for 1.5 years    Types: Cigarettes  . Smokeless tobacco: Never Used  . Alcohol Use: No   OB History    No data available     Review of Systems   Gastrointestinal: Positive for abdominal pain.  Genitourinary: Positive for flank pain.  All other systems reviewed and are negative.     Allergies  Aspirin; Codeine; Compazine; Ioxaglate; Ivp dye; Ketorolac tromethamine; Metrizamide; Morphine and related; Naproxen; Norco; Penicillins; Prochlorperazine maleate; Reglan; Tegretol; Toradol; and Tramadol  Home Medications   Prior to Admission medications   Medication Sig Start Date End Date Taking? Authorizing Provider  alprazolam Prudy Feeler(XANAX) 2 MG tablet Take 2 mg by mouth 2 (two) times daily.   Yes Historical Provider, MD  FLUoxetine (PROZAC) 40 MG capsule Take 40 mg by mouth every morning.    Yes Historical Provider, MD  fluticasone (FLONASE) 50 MCG/ACT nasal spray Place 2 sprays into both nostrils daily. 04/11/14  Yes Historical Provider, MD  hydrochlorothiazide (HYDRODIURIL) 25 MG tablet Take 25 mg by mouth every morning.    Yes Historical Provider, MD  losartan (COZAAR) 50 MG tablet Take 50 mg by mouth daily.   Yes Historical Provider, MD  omeprazole (PRILOSEC) 40 MG capsule Take 40 mg by mouth every morning.    Yes Historical Provider, MD  pregabalin (LYRICA) 75 MG capsule Take 75-150 mg by mouth 2 (two) times daily. Take 2 tablets = 150 mg in the morning and take 1 tablet = 75mg  at night 07/08/14  Yes Historical Provider, MD  promethazine (PHENERGAN) 25 MG tablet Take 25 mg by mouth every 6 (six) hours as needed for nausea.   Yes Historical Provider, MD  zolpidem (AMBIEN) 10 MG tablet Take 10 mg by mouth at bedtime as needed for sleep.   Yes Historical Provider, MD  ciprofloxacin (CIPRO) 500 MG tablet Take 1 tablet (500 mg total) by mouth 2 (two) times daily. 08/09/14   Linwood DibblesJon Knapp, MD  phenazopyridine (PYRIDIUM) 200 MG tablet Take 1 tablet (200 mg total) by mouth 3 (three) times daily. 08/09/14   Linwood DibblesJon Knapp, MD   BP 118/81 mmHg  Pulse 87  Temp(Src) 97.8 F (36.6 C) (Oral)  Resp 14  Ht 5\' 3"  (1.6 m)  Wt 180 lb (81.647 kg)  BMI 31.89 kg/m2   SpO2 98%   Physical Exam  Constitutional: She is oriented to person, place, and time. She appears well-developed and well-nourished.  HENT:  Head: Normocephalic and atraumatic.  Mouth/Throat: Oropharynx is clear and moist.  Eyes: Conjunctivae and EOM are normal. Pupils are equal, round, and reactive to light.  Neck: Normal range of motion.  Cardiovascular: Normal rate, regular rhythm and normal heart sounds.   Pulmonary/Chest: Effort normal and breath sounds normal. No respiratory distress. She has no wheezes.  Abdominal: Soft. Bowel sounds are normal. There is no tenderness. There is CVA tenderness (mild, left). There is no guarding.  Mild left CVA tenderness with some radiation to LLQ; no peritoneal signs  Musculoskeletal: Normal range of motion.  Neurological: She is alert and oriented to person, place, and time.  Skin: Skin is warm and dry.  Psychiatric: She has a normal mood and affect.  Nursing note and vitals reviewed.   ED Course  Procedures (including critical care time) Labs Review Labs Reviewed  COMPREHENSIVE METABOLIC PANEL - Abnormal; Notable for the following:    Glucose, Bld 107 (*)    All other components within normal limits  URINALYSIS, ROUTINE W REFLEX MICROSCOPIC - Abnormal; Notable for the following:    APPearance CLOUDY (*)    Glucose, UA 250 (*)    Leukocytes, UA MODERATE (*)    All other components within normal limits  URINE MICROSCOPIC-ADD ON - Abnormal; Notable for the following:    Squamous Epithelial / LPF FEW (*)    Bacteria, UA MANY (*)    All other components within normal limits  CBC WITH DIFFERENTIAL  LIPASE, BLOOD    Imaging Review No results found.   EKG Interpretation None      MDM   Final diagnoses:  UTI (lower urinary tract infection)  Urinary frequency   48 year old female with left flank pain. She does some radiation to her left lower abdomen associated with nausea. She notes loose stools over the past 2 weeks, no melena  or hematochezia. On exam, patient afebrile and nontoxic in appearance. She has some tenderness of her left flank with radiation to her left lower abdomen, no peritoneal signs. Lab work is unremarkable. Urinalysis appears infectious. No hematuria to suggest ureteral stone or obstruction.  Pain currently well controlled.  Low suspicion for acute or surgical abdomen at this time.  Patient discharged home and instructed to FU with her urologist.  Rx keflex and short course of oxycodone.  Discussed plan with patient, he/she acknowledged understanding and agreed with plan of care.  Return precautions given for new or worsening symptoms.  Garlon HatchetLisa M Halima Fogal, PA-C 09/07/14 [phone]  Loren Raceravid Yelverton, MD 09/08/14 1059

## 2014-09-07 MED ORDER — HYDROMORPHONE HCL 1 MG/ML IJ SOLN
1.0000 mg | Freq: Once | INTRAMUSCULAR | Status: AC
  Administered 2014-09-07: 1 mg via INTRAVENOUS
  Filled 2014-09-07: qty 1

## 2014-09-07 MED ORDER — CEPHALEXIN 500 MG PO CAPS: 500.0000 mg | ORAL_CAPSULE | Freq: Four times a day (QID) | ORAL | Status: DC

## 2014-09-07 MED ORDER — OXYCODONE-ACETAMINOPHEN 5-325 MG PO TABS: 1.0000 | ORAL_TABLET | ORAL | Status: DC | PRN

## 2014-09-07 NOTE — Discharge Instructions (Signed)
Take the prescribed medication as directed. °Follow-up with your urologist. °Return to the ED for new or worsening symptoms. ° °

## 2014-10-29 ENCOUNTER — Encounter (HOSPITAL_COMMUNITY): Payer: Self-pay | Admitting: Emergency Medicine

## 2014-10-29 ENCOUNTER — Emergency Department (HOSPITAL_COMMUNITY)
Admission: EM | Admit: 2014-10-29 | Discharge: 2014-10-29 | Disposition: A | Discharge status: Home / Self Care | Payer: Self-pay | Attending: Emergency Medicine | Admitting: Emergency Medicine

## 2014-10-29 DIAGNOSIS — Z7951 Long term (current) use of inhaled steroids: Secondary | ICD-10-CM | POA: Insufficient documentation

## 2014-10-29 DIAGNOSIS — Z8619 Personal history of other infectious and parasitic diseases: Secondary | ICD-10-CM | POA: Insufficient documentation

## 2014-10-29 DIAGNOSIS — Z792 Long term (current) use of antibiotics: Secondary | ICD-10-CM | POA: Insufficient documentation

## 2014-10-29 DIAGNOSIS — Z720 Tobacco use: Secondary | ICD-10-CM | POA: Insufficient documentation

## 2014-10-29 DIAGNOSIS — R519 Headache, unspecified: Secondary | ICD-10-CM

## 2014-10-29 DIAGNOSIS — R197 Diarrhea, unspecified: Secondary | ICD-10-CM | POA: Insufficient documentation

## 2014-10-29 DIAGNOSIS — R109 Unspecified abdominal pain: Secondary | ICD-10-CM | POA: Insufficient documentation

## 2014-10-29 DIAGNOSIS — Z90710 Acquired absence of both cervix and uterus: Secondary | ICD-10-CM | POA: Insufficient documentation

## 2014-10-29 DIAGNOSIS — Z87442 Personal history of urinary calculi: Secondary | ICD-10-CM | POA: Insufficient documentation

## 2014-10-29 DIAGNOSIS — Z880 Allergy status to penicillin: Secondary | ICD-10-CM | POA: Insufficient documentation

## 2014-10-29 DIAGNOSIS — Z79899 Other long term (current) drug therapy: Secondary | ICD-10-CM | POA: Insufficient documentation

## 2014-10-29 DIAGNOSIS — I10 Essential (primary) hypertension: Secondary | ICD-10-CM | POA: Insufficient documentation

## 2014-10-29 DIAGNOSIS — Z8739 Personal history of other diseases of the musculoskeletal system and connective tissue: Secondary | ICD-10-CM | POA: Insufficient documentation

## 2014-10-29 DIAGNOSIS — Z9889 Other specified postprocedural states: Secondary | ICD-10-CM | POA: Insufficient documentation

## 2014-10-29 DIAGNOSIS — R51 Headache: Secondary | ICD-10-CM | POA: Insufficient documentation

## 2014-10-29 DIAGNOSIS — R112 Nausea with vomiting, unspecified: Secondary | ICD-10-CM | POA: Insufficient documentation

## 2014-10-29 LAB — COMPREHENSIVE METABOLIC PANEL
ALBUMIN: 3.9 g/dL (ref 3.5–5.2)
ALK PHOS: 72 U/L (ref 39–117)
ALT: 24 U/L (ref 0–35)
ANION GAP: 7 (ref 5–15)
AST: 29 U/L (ref 0–37)
BUN: 10 mg/dL (ref 6–23)
CHLORIDE: 108 mmol/L (ref 96–112)
CO2: 25 mmol/L (ref 19–32)
Calcium: 9.5 mg/dL (ref 8.4–10.5)
Creatinine, Ser: 0.59 mg/dL (ref 0.50–1.10)
GFR calc Af Amer: >90 mL/min (ref >90)
Glucose, Bld: 106 mg/dL — ABNORMAL HIGH (ref 70–99)
POTASSIUM: 4.2 mmol/L (ref 3.5–5.1)
SODIUM: 140 mmol/L (ref 135–145)
Total Bilirubin: 0.8 mg/dL (ref 0.3–1.2)
Total Protein: 7.3 g/dL (ref 6.0–8.3)

## 2014-10-29 LAB — CBC
HEMATOCRIT: 42.1 % (ref 36.0–46.0)
Hemoglobin: 14.5 g/dL (ref 12.0–15.0)
MCH: 32.4 pg (ref 26.0–34.0)
MCHC: 34.4 g/dL (ref 30.0–36.0)
MCV: 94.2 fL (ref 78.0–100.0)
Platelets: 191 10*3/uL (ref 150–400)
RBC: 4.47 MIL/uL (ref 3.87–5.11)
RDW: 13.1 % (ref 11.5–15.5)
WBC: 9.2 10*3/uL (ref 4.0–10.5)

## 2014-10-29 LAB — URINALYSIS, ROUTINE W REFLEX MICROSCOPIC
Bilirubin Urine: NEGATIVE
GLUCOSE, UA: NEGATIVE mg/dL
Hgb urine dipstick: NEGATIVE
KETONES UR: NEGATIVE mg/dL
NITRITE: NEGATIVE
PH: 7 (ref 5.0–8.0)
PROTEIN: NEGATIVE mg/dL
SPECIFIC GRAVITY, URINE: 1.009 (ref 1.005–1.030)
UROBILINOGEN UA: 0.2 mg/dL (ref 0.0–1.0)

## 2014-10-29 LAB — URINE MICROSCOPIC-ADD ON

## 2014-10-29 LAB — LIPASE, BLOOD: LIPASE: 24 U/L (ref 11–59)

## 2014-10-29 MED ORDER — HYDROMORPHONE HCL 1 MG/ML IJ SOLN
1.0000 mg | Freq: Once | INTRAMUSCULAR | Status: AC
  Administered 2014-10-29: 1 mg via INTRAVENOUS
  Filled 2014-10-29: qty 1

## 2014-10-29 MED ORDER — ONDANSETRON HCL 4 MG/2ML IJ SOLN
4.0000 mg | Freq: Once | INTRAMUSCULAR | Status: AC
  Administered 2014-10-29: 4 mg via INTRAVENOUS
  Filled 2014-10-29: qty 2

## 2014-10-29 MED ORDER — SODIUM CHLORIDE 0.9 % IV BOLUS (SEPSIS)
1000.0000 mL | Freq: Once | INTRAVENOUS | Status: AC
  Administered 2014-10-29: 1000 mL via INTRAVENOUS

## 2014-10-29 MED ORDER — HYDROMORPHONE HCL 1 MG/ML IJ SOLN
0.5000 mg | Freq: Once | INTRAMUSCULAR | Status: AC
  Administered 2014-10-29: 0.5 mg via INTRAVENOUS
  Filled 2014-10-29: qty 1

## 2014-10-29 MED ORDER — OXYCODONE-ACETAMINOPHEN 5-325 MG PO TABS
1.0000 | ORAL_TABLET | Freq: Four times a day (QID) | ORAL | Status: DC | PRN
Start: 2014-10-29 — End: 2015-01-30

## 2014-10-29 NOTE — ED Notes (Signed)
Pt from home c/o headache, LUQ abdominal pain, and diarrhea x 3 days.  She reports this headache is different than her migraines and states "My eyeball hurt".

## 2014-10-29 NOTE — Discharge Instructions (Signed)
It was our pleasure to provide your ER care today - we hope that you feel better.  Continue prilosec. You may take also take maalox or mylanta as need.  You may take percocet as need for pain. No driving when taking percocet. Also, do not take tylenol or acetaminophen containing medication when taking percocet.   Follow up with primary care doctor in coming week.;  Return to ER if worse, new symptoms, fevers, persistent vomiting, other concern.  You were given pain medication in the ER - no driving for the next 4 hours.      Abdominal Pain Many things can cause abdominal pain. Usually, abdominal pain is not caused by a disease and will improve without treatment. It can often be observed and treated at home. Your health care provider will do a physical exam and possibly order blood tests and X-rays to help determine the seriousness of your pain. However, in many cases, more time must pass before a clear cause of the pain can be found. Before that point, your health care provider may not know if you need more testing or further treatment. HOME CARE INSTRUCTIONS  Monitor your abdominal pain for any changes. The following actions may help to alleviate any discomfort you are experiencing:  Only take over-the-counter or prescription medicines as directed by your health care provider.  Do not take laxatives unless directed to do so by your health care provider.  Try a clear liquid diet (broth, tea, or water) as directed by your health care provider. Slowly move to a bland diet as tolerated. SEEK MEDICAL CARE IF:  You have unexplained abdominal pain.  You have abdominal pain associated with nausea or diarrhea.  You have pain when you urinate or have a bowel movement.  You experience abdominal pain that wakes you in the night.  You have abdominal pain that is worsened or improved by eating food.  You have abdominal pain that is worsened with eating fatty foods.  You have a fever. SEEK  IMMEDIATE MEDICAL CARE IF:   Your pain does not go away within 2 hours.  You keep throwing up (vomiting).  Your pain is felt only in portions of the abdomen, such as the right side or the left lower portion of the abdomen.  You pass bloody or black tarry stools. MAKE SURE YOU:  Understand these instructions.   Will watch your condition.   Will get help right away if you are not doing well or get worse.  Document Released: 06/05/2005 Document Revised: 08/31/2013 Document Reviewed: 05/05/2013 [facility] Patient Information 2015 [place]. This information is not intended to replace advice given to you by your health care provider. Make sure you discuss any questions you have with your health care provider.    General Headache Without Cause A headache is pain or discomfort felt around the head or neck area. The specific cause of a headache may not be found. There are many causes and types of headaches. A few common ones are:  Tension headaches.  Migraine headaches.  Cluster headaches.  Chronic daily headaches. HOME CARE INSTRUCTIONS   Keep all follow-up appointments with your caregiver or any specialist referral.  Only take over-the-counter or prescription medicines for pain or discomfort as directed by your caregiver.  Lie down in a dark, quiet room when you have a headache.  Keep a headache journal to find out what may trigger your migraine headaches. For example, write down:  What you eat and drink.  How much  sleep you get.  Any change to your diet or medicines.  Try massage or other relaxation techniques.  Put ice packs or heat on the head and neck. Use these 3 to 4 times per day for 15 to 20 minutes each time, or as needed.  Limit stress.  Sit up straight, and do not tense your muscles.  Quit smoking if you smoke.  Limit alcohol use.  Decrease the amount of caffeine you drink, or stop drinking caffeine.  Eat and sleep on a regular schedule.  Get  7 to 9 hours of sleep, or as recommended by your caregiver.  Keep lights dim if bright lights bother you and make your headaches worse. SEEK MEDICAL CARE IF:   You have problems with the medicines you were prescribed.  Your medicines are not working.  You have a change from the usual headache.  You have nausea or vomiting. SEEK IMMEDIATE MEDICAL CARE IF:   Your headache becomes severe.  You have a fever.  You have a stiff neck.  You have loss of vision.  You have muscular weakness or loss of muscle control.  You start losing your balance or have trouble walking.  You feel faint or pass out.  You have severe symptoms that are different from your first symptoms. MAKE SURE YOU:   Understand these instructions.  Will watch your condition.  Will get help right away if you are not doing well or get worse. Document Released: 08/26/2005 Document Revised: 11/18/2011 Document Reviewed: 09/11/2011 [facility] Patient Information 2015 [place], [place]. This information is not intended to replace advice given to you by your health care provider. Make sure you discuss any questions you have with your health care provider.

## 2014-10-29 NOTE — ED Provider Notes (Addendum)
CSN: [id number]     Arrival date & time 10/29/14  1131 History   First MD Initiated Contact with Patient 10/29/14 1154     Chief Complaint  Patient presents with  . Abdominal Pain  . Headache     (Consider location/radiation/quality/duration/timing/severity/associated sxs/prior Treatment) Patient is a 49 y.o. female presenting with abdominal pain and headaches. The history is provided by the patient.  Abdominal Pain Associated symptoms: diarrhea, nausea and vomiting   Associated symptoms: no chest pain, no chills, no cough, no dysuria, no fever, no shortness of breath and no sore throat   Headache Associated symptoms: abdominal pain, diarrhea, nausea and vomiting   Associated symptoms: no back pain, no cough, no eye pain, no fever, no neck pain, no numbness, no sore throat and no weakness   pt with hx migraines, htn, c/o dull frontal headache, as well as abd cramping and nvd for the past few days. Pain is persistent, constant. Was mild at onset, and gradual in onset. C/w prior headaches. Hx migraines. No eye pain or change in vision. No neck pain or stiffness. No head injury, trauma, or fall. No syncope. No associated numbness/weakness. No change in vision or speech. No problems w balance, coordination, or normal functional ability. Nvd is episodic. Emesis, clear or color recently ingested fluids, not bloody or bilious. Diarrhea watery, 4 episodes today. No abd distension. Crampy, diffuse, intermittent abd pain, no constant or focal abd pain. No dysuria or hematuria. No back or flank pain. No known bad food ingestion, or ill contacts. No recent abx use. abd surgery hx includes hysterectomy.   Past Medical History  Diagnosis Date  . Hypertension   . Hepatitis C   . Sciatica   . Kidney stones    Past Surgical History  Procedure Laterality Date  . Abdominal hysterectomy    . Sinus exploration    . Lithotripsy    . Kidney stone surgery Right 02/12/12  . Kidney stone surgery Left 07/15/2012   . Oophorectomy     Family History  Problem Relation Age of Onset  . Heart failure Mother   . Diabetes Father   . Cancer Sister    History  Substance Use Topics  . Smoking status: Current Every Day Smoker -- 0.50 packs/day for 1.5 years    Types: Cigarettes  . Smokeless tobacco: Never Used  . Alcohol Use: No   OB History    No data available     Review of Systems  Constitutional: Negative for fever and chills.  HENT: Negative for sore throat.   Eyes: Negative for pain, redness and visual disturbance.  Respiratory: Negative for cough and shortness of breath.   Cardiovascular: Negative for chest pain.  Gastrointestinal: Positive for nausea, vomiting, abdominal pain and diarrhea.  Endocrine: Negative for polyuria.  Genitourinary: Negative for dysuria and flank pain.  Musculoskeletal: Negative for back pain and neck pain.  Skin: Negative for rash.  Neurological: Positive for headaches. Negative for syncope, weakness and numbness.  Hematological: Does not bruise/bleed easily.  Psychiatric/Behavioral: Negative for confusion.      Allergies  Aspirin; Codeine; Compazine; Ioxaglate; Ivp dye; Ketorolac tromethamine; Metrizamide; Morphine and related; Naproxen; Norco; Penicillins; Prochlorperazine maleate; Reglan; Tegretol; Toradol; and Tramadol  Home Medications   Prior to Admission medications   Medication Sig Start Date End Date Taking? Authorizing Provider  alprazolam Prudy Feeler) 2 MG tablet Take 2 mg by mouth 2 (two) times daily.    Historical Provider, MD  cephALEXin (KEFLEX) 500 MG capsule Take  1 capsule (500 mg total) by mouth 4 (four) times daily. 09/07/14   Garlon Hatchet, PA-C  ciprofloxacin (CIPRO) 500 MG tablet Take 1 tablet (500 mg total) by mouth 2 (two) times daily. 08/09/14   Linwood Dibbles, MD  FLUoxetine (PROZAC) 40 MG capsule Take 40 mg by mouth every morning.     Historical Provider, MD  fluticasone (FLONASE) 50 MCG/ACT nasal spray Place 2 sprays into both nostrils  daily. 04/11/14   Historical Provider, MD  hydrochlorothiazide (HYDRODIURIL) 25 MG tablet Take 25 mg by mouth every morning.     Historical Provider, MD  losartan (COZAAR) 50 MG tablet Take 50 mg by mouth daily.    Historical Provider, MD  omeprazole (PRILOSEC) 40 MG capsule Take 40 mg by mouth every morning.     Historical Provider, MD  oxyCODONE-acetaminophen (PERCOCET/ROXICET) 5-325 MG per tablet Take 1 tablet by mouth every 4 (four) hours as needed. 09/07/14   Garlon Hatchet, PA-C  phenazopyridine (PYRIDIUM) 200 MG tablet Take 1 tablet (200 mg total) by mouth 3 (three) times daily. 08/09/14   Linwood Dibbles, MD  pregabalin (LYRICA) 75 MG capsule Take 75-150 mg by mouth 2 (two) times daily. Take 2 tablets = 150 mg in the morning and take 1 tablet =  at night 07/08/14   Historical Provider, MD  promethazine (PHENERGAN) 25 MG tablet Take 25 mg by mouth every 6 (six) hours as needed for nausea.    Historical Provider, MD  zolpidem (AMBIEN) 10 MG tablet Take 10 mg by mouth at bedtime as needed for sleep.    Historical Provider, MD   BP 168/101 mmHg  Pulse 68  Temp(Src) 97.7 F (36.5 C) (Oral)  Resp 20  SpO2 100% Physical Exam  Constitutional: She is oriented to person, place, and time. She appears well-developed and well-nourished. No distress.  HENT:  Head: Atraumatic.  Nose: Nose normal.  Mouth/Throat: Oropharynx is clear and moist.  No sinus or temporal tenderness.  Eyes: Conjunctivae and EOM are normal. Pupils are equal, round, and reactive to light. No scleral icterus.  Neck: Neck supple. No tracheal deviation present. No thyromegaly present.  No stiffness or rigidity.   Cardiovascular: Normal rate, regular rhythm, normal heart sounds and intact distal pulses.  Exam reveals no gallop and no friction rub.   No murmur heard. Pulmonary/Chest: Effort normal and breath sounds normal. No respiratory distress.  Abdominal: Soft. Normal appearance and bowel sounds are normal. She exhibits no  distension and no mass. There is no tenderness. There is no rebound and no guarding.  Genitourinary:  No cva tenderness.  Musculoskeletal: Normal range of motion. She exhibits no edema or tenderness.  Neurological: She is alert and oriented to person, place, and time. No cranial nerve deficit.  Motor intact bilaterally. stre 5/5, sens intact. Steady gait.   Skin: Skin is warm and dry. No rash noted. She is not diaphoretic.  Psychiatric: She has a normal mood and affect.  Nursing note and vitals reviewed.   ED Course  Procedures (including critical care time) Labs Review  Results for orders placed or performed during the hospital encounter of 10/29/14  CBC  Result Value Ref Range   WBC 9.2 4.0 - 10.5 K/uL   RBC 4.47 3.87 - 5.11 MIL/uL   Hemoglobin 14.5 12.0 - 15.0 g/dL   HCT 16.1 09.6 - 04.5 %   MCV 94.2 78.0 - 100.0 fL   MCH 32.4 26.0 - 34.0 pg   MCHC 34.4 30.0 - 36.0  g/dL   RDW 78.213.1 95.611.5 - 21.315.5 %   Platelets 191 150 - 400 K/uL  Comprehensive metabolic panel  Result Value Ref Range   Sodium 140 135 - 145 mmol/L   Potassium 4.2 3.5 - 5.1 mmol/L   Chloride 108 96 - 112 mmol/L   CO2 25 19 - 32 mmol/L   Glucose, Bld 106 (H) 70 - 99 mg/dL   BUN 10 6 - 23 mg/dL   Creatinine, Ser 0.860.59 0.50 - 1.10 mg/dL   Calcium 9.5 8.4 - 57.810.5 mg/dL   Total Protein 7.3 6.0 - 8.3 g/dL   Albumin 3.9 3.5 - 5.2 g/dL   AST 29 0 - 37 U/L   ALT 24 0 - 35 U/L   Alkaline Phosphatase 72 39 - 117 U/L   Total Bilirubin 0.8 0.3 - 1.2 mg/dL   GFR calc non Af Amer >90 >90 mL/min   GFR calc Af Amer >90 >90 mL/min   Anion gap 7 5 - 15  Lipase, blood  Result Value Ref Range   Lipase 24 11 - 59 U/L  Urinalysis, Routine w reflex microscopic  Result Value Ref Range   Color, Urine YELLOW YELLOW   APPearance CLEAR CLEAR   Specific Gravity, Urine 1.009 1.005 - 1.030   pH 7.0 5.0 - 8.0   Glucose, UA NEGATIVE NEGATIVE mg/dL   Hgb urine dipstick NEGATIVE NEGATIVE   Bilirubin Urine NEGATIVE NEGATIVE   Ketones,  ur NEGATIVE NEGATIVE mg/dL   Protein, ur NEGATIVE NEGATIVE mg/dL   Urobilinogen, UA 0.2 0.0 - 1.0 mg/dL   Nitrite NEGATIVE NEGATIVE   Leukocytes, UA SMALL (A) NEGATIVE  Urine microscopic-add on  Result Value Ref Range   Squamous Epithelial / LPF RARE RARE   WBC, UA 3-6 <3 WBC/hpf   RBC / HPF 0-2 <3 RBC/hpf        EKG Interpretation   Date/Time:  Saturday October 29 2014 11:54:08 EST Ventricular Rate:  62 PR Interval:  183 QRS Duration: 77 QT Interval:  400 QTC Calculation: 406 R Axis:   -3 Text Interpretation:  Sinus rhythm Normal ECG Baseline wander Confirmed by  Simran Mannis  MD, Caryn BeeKEVIN ([id number]) on 10/29/2014 12:03:03 PM      MDM   Iv ns bolus.  Pt requests pain med. Dilaudid 1 mg iv. zofran iv.  Labs.  Reviewed nursing notes and prior charts for additional history.  Pt w several prior cts for eval abd pain - neg acute.  Pt afeb. abd soft nt. Wbc normal.   Recheck pt comfortable. Headache improved. No neck pain or stiffness.  bp elevated today.  Discussed importance pcp f/u in coming week for recheck, and recheck of bp.   Pt currently appears stable for d/c.  Return precautions provided.  At d/c, pt indicates out of pain med at home/percocet, will give small quantity rx.       Suzi RootsKevin E Dontario Evetts, MD 10/29/14 [phone]

## 2014-11-03 IMAGING — US US ABDOMEN COMPLETE
1 series · 13 of 25 positions shown · non-contrast
Comparison: 06/27/2012 renal ultrasound.  12/25/2011 CT.

CLINICAL DATA: Right quadrant pain.  Hypertension.  Hepatitis C.

COMPLETE ABDOMINAL ULTRASOUND

[Series 1: us abdomen complete · 0.28mm/px · 13 of 72 slices shown]
[im 1/72]
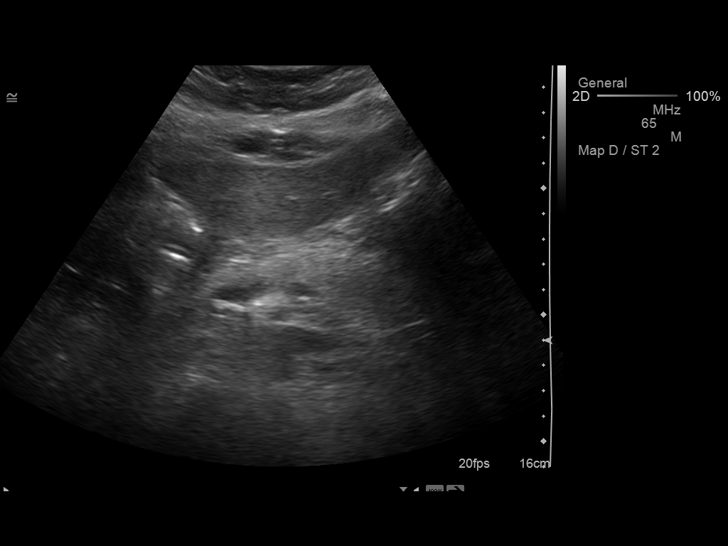
[im 6/72]
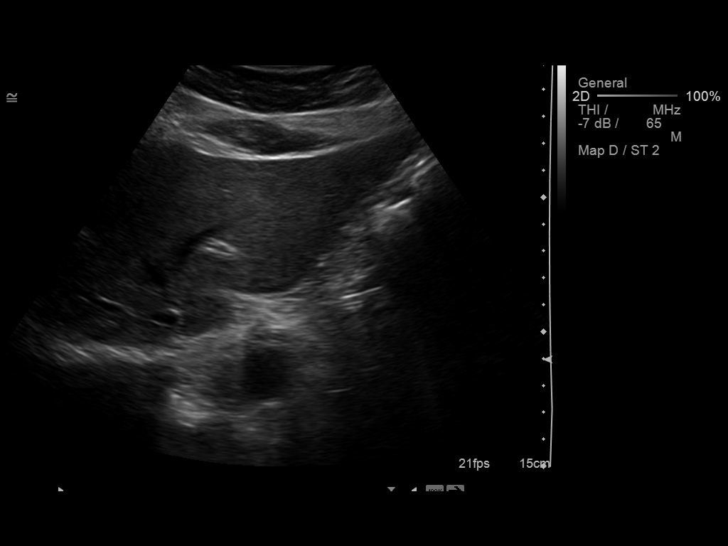
[im 12/72]
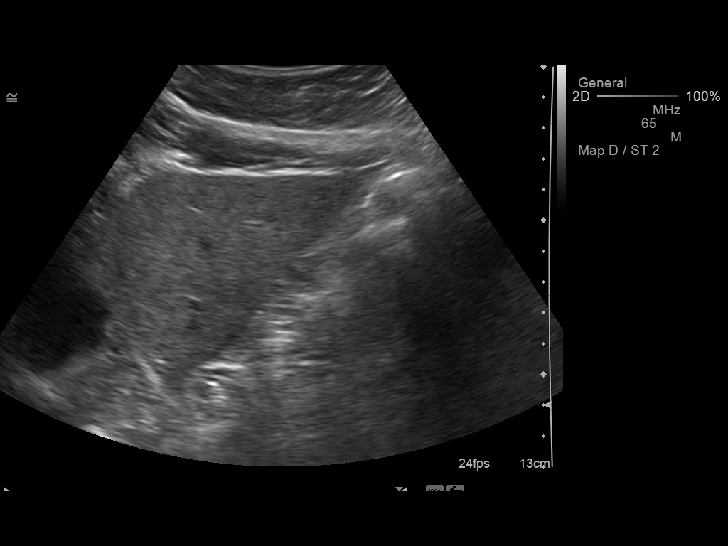
[im 18/72]
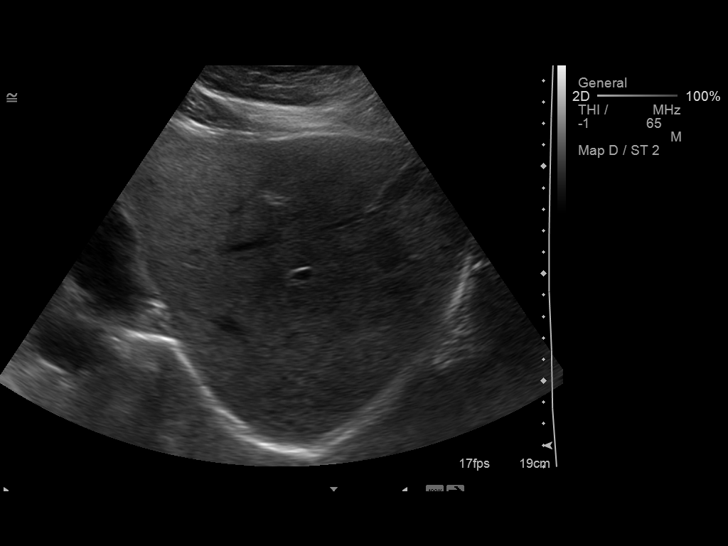
[im 24/72]
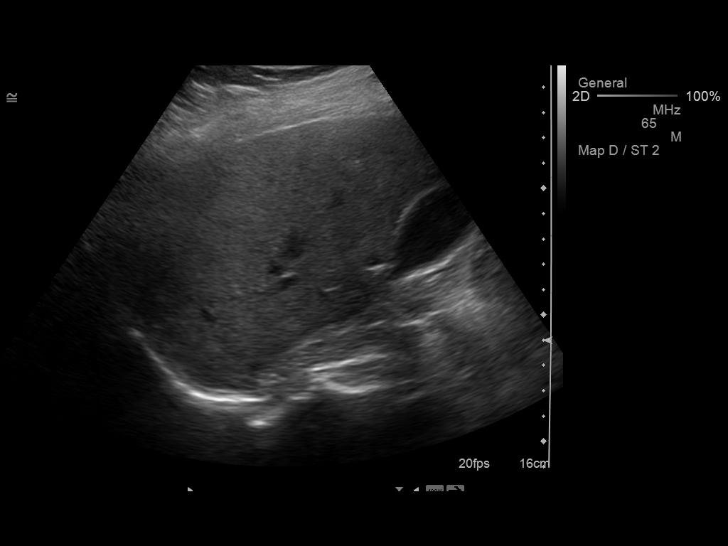
[im 30/72]
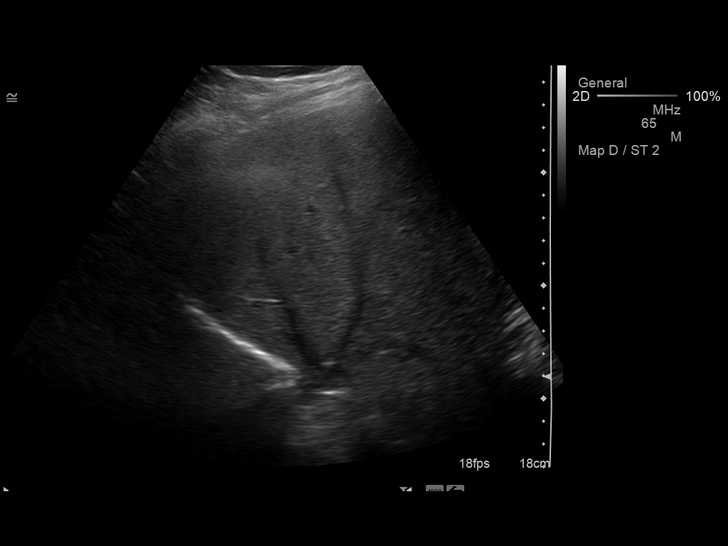
[im 36/72]
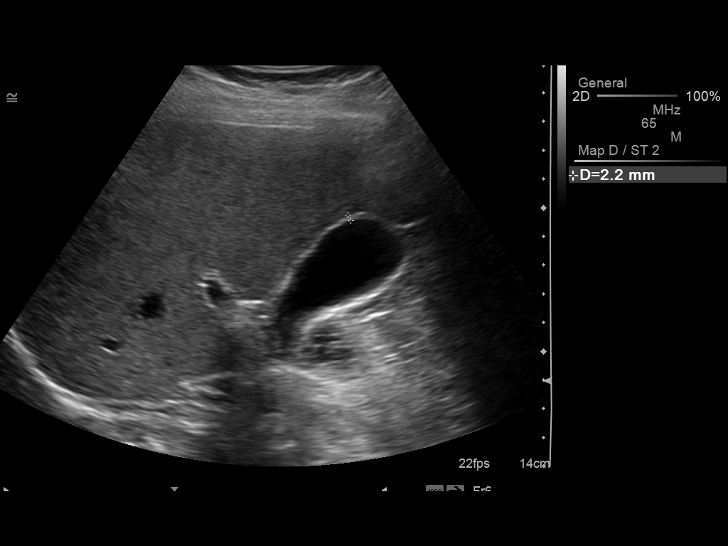
[im 42/72]
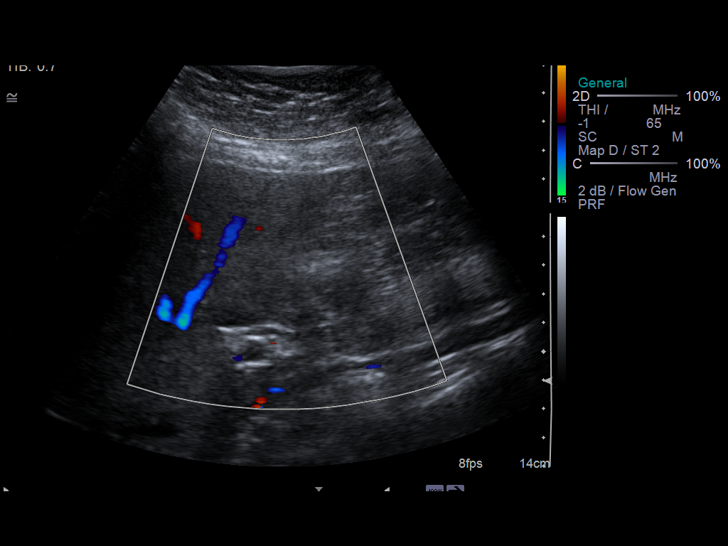
[im 48/72]
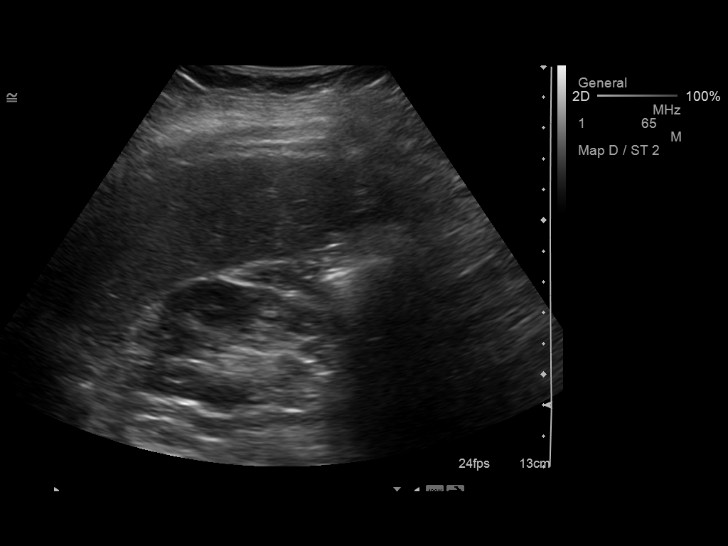
[im 54/72]
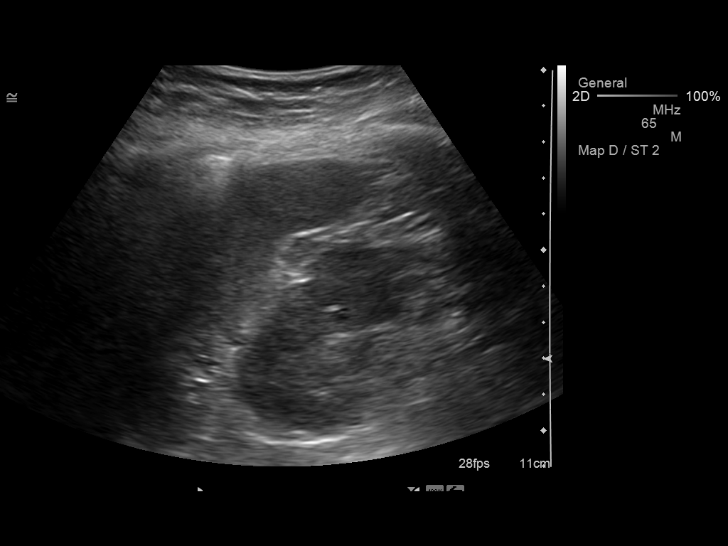
[im 60/72]
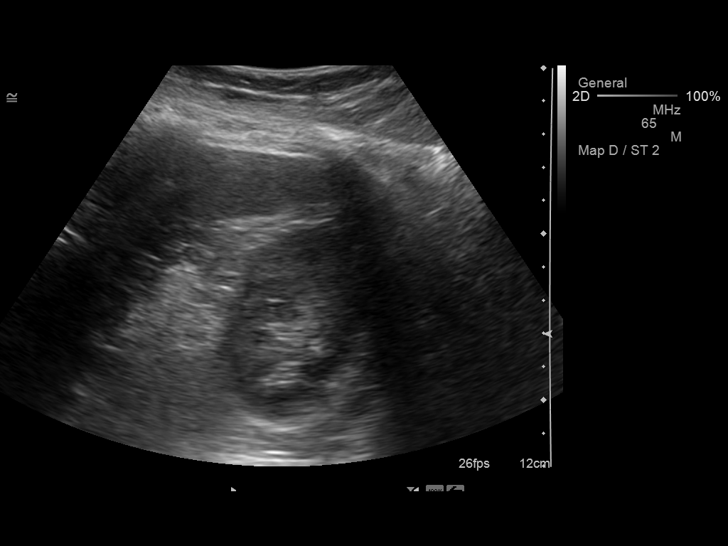
[im 66/72]
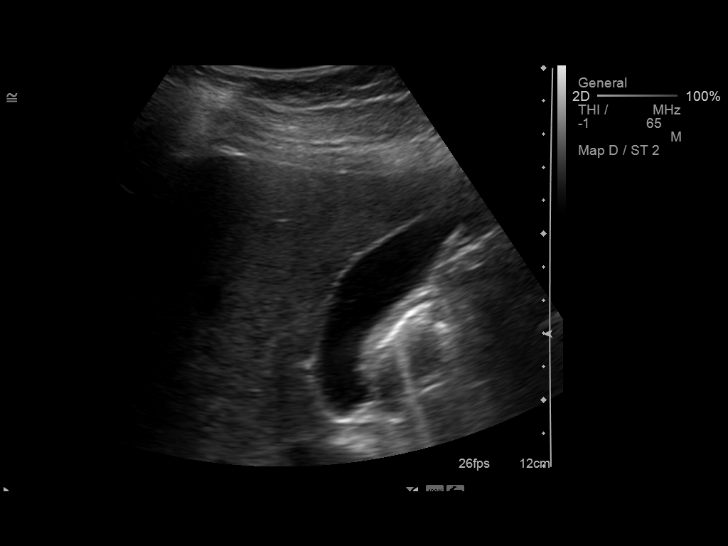
[im 72/72]
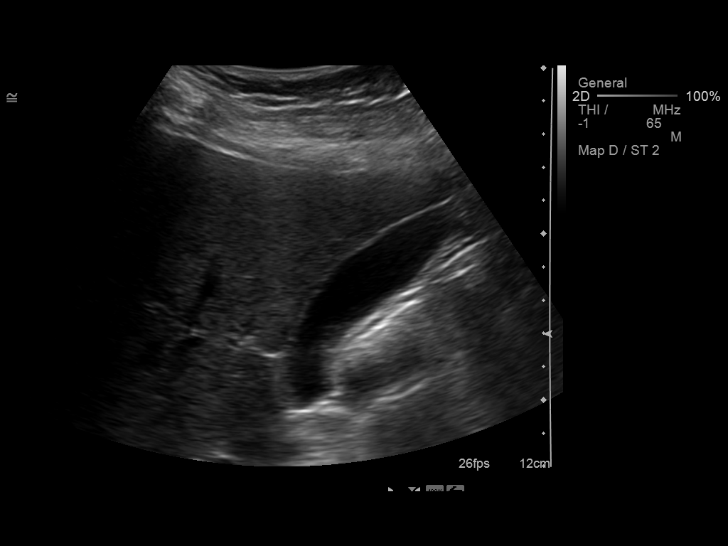

[13 of 25 positions shown; findings below may reference images not displayed]

FINDINGS: Gallbladder:  No gallstones or gallbladder wall thickening.
Initially, possibility of pericholecystic fluid is raised.  On
decubitus views, this does not persist.  The patient was not tender
over this region during scanning as per ultrasound technologist.

Common bile duct:  3.5 mm.

Liver:  Mild diffuse increased echogenicity suggestive of fatty
infiltration.  No focal hepatic lesion or intrapelvic biliary duct
dilation.

IVC:  Appears normal.

Pancreas:  Portions poorly delineated secondary to overlying bowel
gas.  The portions which are visualized are unremarkable.

Spleen:  5.5 cm.  No focal mass.

Right Kidney:  9.6 cm.  Lobulated contour consistent with areas of
cortical scarring as previously noted. This limits detection of
renal mass.  CT detected renal calculi not as well demonstrated
present exam.  No hydronephrosis.

Left Kidney:  10.0 cm.  Lobulated contour consistent with areas of
cortical scarring as previously noted. This limits detection of
renal mass.  CT detected renal calculi not as well demonstrated
present exam.  No hydronephrosis.

Abdominal aorta:  No aneurysm identified.
IMPRESSION: No gallstones or ultrasound findings of cholecystitis.

Mild fatty infiltration liver.

Renal cortical irregularity consistent with scarring as noted on
prior exams.  The previously noted renal calculi are not as well
demonstrated present exam.  No evidence hydronephrosis.

Portions of pancreas poorly delineated secondary to overlying bowel
gas.

## 2014-11-28 ENCOUNTER — Emergency Department (HOSPITAL_COMMUNITY)
Admission: EM | Admit: 2014-11-28 | Discharge: 2014-11-28 | Disposition: A | Discharge status: Home / Self Care | Payer: Self-pay

## 2014-11-29 ENCOUNTER — Encounter (HOSPITAL_COMMUNITY): Payer: Self-pay | Admitting: Emergency Medicine

## 2014-11-29 ENCOUNTER — Emergency Department (HOSPITAL_COMMUNITY)
Admission: EM | Admit: 2014-11-29 | Discharge: 2014-11-29 | Disposition: A | Discharge status: Home / Self Care | Payer: Self-pay | Attending: Emergency Medicine | Admitting: Emergency Medicine

## 2014-11-29 ENCOUNTER — Emergency Department (HOSPITAL_COMMUNITY): Payer: Self-pay

## 2014-11-29 DIAGNOSIS — Z79899 Other long term (current) drug therapy: Secondary | ICD-10-CM | POA: Insufficient documentation

## 2014-11-29 DIAGNOSIS — Y9289 Other specified places as the place of occurrence of the external cause: Secondary | ICD-10-CM | POA: Insufficient documentation

## 2014-11-29 DIAGNOSIS — Z7951 Long term (current) use of inhaled steroids: Secondary | ICD-10-CM | POA: Insufficient documentation

## 2014-11-29 DIAGNOSIS — W19XXXA Unspecified fall, initial encounter: Secondary | ICD-10-CM

## 2014-11-29 DIAGNOSIS — Z880 Allergy status to penicillin: Secondary | ICD-10-CM | POA: Insufficient documentation

## 2014-11-29 DIAGNOSIS — I10 Essential (primary) hypertension: Secondary | ICD-10-CM | POA: Insufficient documentation

## 2014-11-29 DIAGNOSIS — Z8619 Personal history of other infectious and parasitic diseases: Secondary | ICD-10-CM | POA: Insufficient documentation

## 2014-11-29 DIAGNOSIS — Z720 Tobacco use: Secondary | ICD-10-CM | POA: Insufficient documentation

## 2014-11-29 DIAGNOSIS — W108XXA Fall (on) (from) other stairs and steps, initial encounter: Secondary | ICD-10-CM | POA: Insufficient documentation

## 2014-11-29 DIAGNOSIS — Z87442 Personal history of urinary calculi: Secondary | ICD-10-CM | POA: Insufficient documentation

## 2014-11-29 DIAGNOSIS — Z8739 Personal history of other diseases of the musculoskeletal system and connective tissue: Secondary | ICD-10-CM | POA: Insufficient documentation

## 2014-11-29 DIAGNOSIS — Y998 Other external cause status: Secondary | ICD-10-CM | POA: Insufficient documentation

## 2014-11-29 DIAGNOSIS — Z792 Long term (current) use of antibiotics: Secondary | ICD-10-CM | POA: Insufficient documentation

## 2014-11-29 DIAGNOSIS — S3991XA Unspecified injury of abdomen, initial encounter: Secondary | ICD-10-CM | POA: Insufficient documentation

## 2014-11-29 DIAGNOSIS — S20212A Contusion of left front wall of thorax, initial encounter: Secondary | ICD-10-CM | POA: Insufficient documentation

## 2014-11-29 DIAGNOSIS — Y9389 Activity, other specified: Secondary | ICD-10-CM | POA: Insufficient documentation

## 2014-11-29 DIAGNOSIS — S20419A Abrasion of unspecified back wall of thorax, initial encounter: Secondary | ICD-10-CM | POA: Insufficient documentation

## 2014-11-29 MED ORDER — OXYCODONE HCL 5 MG PO TABS
10.0000 mg | ORAL_TABLET | Freq: Once | ORAL | Status: AC
  Administered 2014-11-29: 10 mg via ORAL
  Filled 2014-11-29: qty 2

## 2014-11-29 MED ORDER — OXYCODONE HCL 5 MG PO TABS: 5.0000 mg | ORAL_TABLET | Freq: Four times a day (QID) | ORAL | Status: DC | PRN

## 2014-11-29 MED ORDER — LIDOCAINE 5 % EX PTCH
1.0000 | MEDICATED_PATCH | CUTANEOUS | Status: DC
  Filled 2014-11-29: qty 1

## 2014-11-29 MED ORDER — ONDANSETRON 4 MG PO TBDP
4.0000 mg | ORAL_TABLET | Freq: Once | ORAL | Status: AC
  Administered 2014-11-29: 4 mg via ORAL
  Filled 2014-11-29: qty 1

## 2014-11-29 MED ORDER — TIZANIDINE HCL 4 MG PO TABS: 4.0000 mg | ORAL_TABLET | Freq: Three times a day (TID) | ORAL | Status: DC | PRN

## 2014-11-29 NOTE — ED Provider Notes (Signed)
CSN: [id number]     Arrival date & time 11/29/14  1900 History  This chart was scribed for non-physician practitioner, Earley FavorGail Vici Novick, NP-C working with Eber HongBrian Miller, MD, by Abel PrestoKara Demonbreun, ED Scribe. This patient was seen in room WTR5/WTR5 and the patient's care was started at 8:05 PM.     Chief Complaint  Patient presents with  . Fall     Patient is a 49 y.o. female presenting with fall. The history is provided by the patient. No language interpreter was used.  Fall This is a new problem. The problem occurs constantly. The problem has not changed since onset.Associated symptoms include chest pain. Pertinent negatives include no shortness of breath. Exacerbated by: breathing. The symptoms are relieved by NSAIDs, narcotics, rest and position. She has tried acetaminophen for the symptoms. The treatment provided no relief.   HPI Comments: Stephanie [name] is a 49 y.o. female who presents to the Emergency Department complaining of fall today at 6 AM. Pt sates she tripped on a toy and fell backwards down two steps. She notes she has not eaten anything in  4 days. She notes associated left flank pain and fatigue. She has taken ibuprofen for relief. Pt denies any other medical complaints at this time. Pt has no PCP currently.   Past Medical History  Diagnosis Date  . Hypertension   . Hepatitis C   . Sciatica   . Kidney stones    Past Surgical History  Procedure Laterality Date  . Abdominal hysterectomy    . Sinus exploration    . Lithotripsy    . Kidney stone surgery Right 02/12/12  . Kidney stone surgery Left 07/15/2012  . Oophorectomy     Family History  Problem Relation Age of Onset  . Heart failure Mother   . Diabetes Father   . Cancer Sister    History  Substance Use Topics  . Smoking status: Current Every Day Smoker -- 0.50 packs/day for 1.5 years    Types: Cigarettes  . Smokeless tobacco: Never Used  . Alcohol Use: No   OB History    No data available     Review of  Systems  Constitutional: Positive for fatigue.  Respiratory: Negative for shortness of breath.   Cardiovascular: Positive for chest pain.  Genitourinary: Positive for flank pain.  Musculoskeletal: Positive for back pain.  All other systems reviewed and are negative.     Allergies  Aspirin; Codeine; Compazine; Ioxaglate; Ivp dye; Ketorolac tromethamine; Metrizamide; Morphine and related; Naproxen; Norco; Penicillins; Prochlorperazine maleate; Reglan; Tegretol; Toradol; and Tramadol  Home Medications   Prior to Admission medications   Medication Sig Start Date End Date Taking? Authorizing Provider  alprazolam Prudy Feeler(XANAX) 2 MG tablet Take 2 mg by mouth 2 (two) times daily as needed for anxiety.     Historical Provider, MD  cephALEXin (KEFLEX) 500 MG capsule Take 1 capsule (500 mg total) by mouth 4 (four) times daily. Patient not taking: Reported on 10/29/2014 09/07/14   Garlon HatchetLisa M Sanders, PA-C  ciprofloxacin (CIPRO) 500 MG tablet Take 1 tablet (500 mg total) by mouth 2 (two) times daily. Patient not taking: Reported on 10/29/2014 08/09/14   Linwood DibblesJon Knapp, MD  FLUoxetine (PROZAC) 40 MG capsule Take 40 mg by mouth every morning.     Historical Provider, MD  fluticasone (FLONASE) 50 MCG/ACT nasal spray Place 2 sprays into both nostrils daily. 04/11/14   Historical Provider, MD  hydrochlorothiazide (HYDRODIURIL) 25 MG tablet Take 25 mg by mouth every morning.  Historical Provider, MD  losartan (COZAAR) 50 MG tablet Take 50 mg by mouth daily.    Historical Provider, MD  omeprazole (PRILOSEC) 40 MG capsule Take 40 mg by mouth every morning.     Historical Provider, MD  oxyCODONE (OXY IR/ROXICODONE) 5 MG immediate release tablet Take 1 tablet (5 mg total) by mouth every 6 (six) hours as needed for severe pain. 11/29/14   Earley Favor, NP  oxyCODONE-acetaminophen (PERCOCET/ROXICET) 5-325 MG per tablet Take 1 tablet by mouth every 4 (four) hours as needed. Patient not taking: Reported on 10/29/2014 09/07/14   Garlon Hatchet, PA-C  oxyCODONE-acetaminophen (PERCOCET/ROXICET) 5-325 MG per tablet Take 1-2 tablets by mouth every 6 (six) hours as needed for severe pain. 10/29/14   Cathren Laine, MD  phenazopyridine (PYRIDIUM) 200 MG tablet Take 1 tablet (200 mg total) by mouth 3 (three) times daily. Patient not taking: Reported on 10/29/2014 08/09/14   Linwood Dibbles, MD  promethazine (PHENERGAN) 25 MG tablet Take 25 mg by mouth every 6 (six) hours as needed for nausea.    Historical Provider, MD  tiZANidine (ZANAFLEX) 4 MG tablet Take 1 tablet (4 mg total) by mouth every 8 (eight) hours as needed for muscle spasms. 11/29/14   Earley Favor, NP  zolpidem (AMBIEN) 10 MG tablet Take 10 mg by mouth at bedtime as needed for sleep.    Historical Provider, MD   BP 144/95 mmHg  Pulse 92  Temp(Src) 98 F (36.7 C) (Oral)  Resp 18  SpO2 99% Physical Exam  Constitutional: She is oriented to person, place, and time. She appears well-developed and well-nourished.  HENT:  Head: Normocephalic.  Eyes: Conjunctivae are normal.  Neck: Normal range of motion. Neck supple.  Pulmonary/Chest: Effort normal. She exhibits tenderness.  [name] is posterior lower rib discomfort with palpation, no discoloration, bruising, abrasions to her back or lateral rib area  Musculoskeletal: Normal range of motion.  Neurological: She is alert and oriented to person, place, and time.  Skin: Skin is warm and dry.  Psychiatric: She has a normal mood and affect. Her behavior is normal.  Nursing note and vitals reviewed.   ED Course  Procedures (including critical care time) DIAGNOSTIC STUDIES: Oxygen Saturation is 99% on room air, normal by my interpretation.    COORDINATION OF CARE: 8:08 PM Discussed treatment plan with patient at beside, the patient agrees with the plan and has no further questions at this time.   Labs Review Labs Reviewed - No data to display  Imaging Review Dg Ribs Unilateral W/chest Left  11/29/2014   CLINICAL DATA:   Status post fall down steps after slipping on dog toy. Posterior left lower rib pain. Initial encounter.  EXAM: LEFT RIBS AND CHEST - 3+ VIEW  COMPARISON:  Chest radiograph performed 08/09/2014  FINDINGS: No displaced rib fractures are seen.  The lungs are well-aerated. Mild vascular congestion is noted. Minimal bibasilar atelectasis is seen. There is no evidence of pleural effusion or pneumothorax.  The cardiomediastinal silhouette is mildly enlarged. No acute osseous abnormalities are seen.  IMPRESSION: 1. No displaced rib fractures identified. 2. Mild vascular congestion and mild cardiomegaly noted. Minimal bibasilar atelectasis seen.   Electronically Signed   By: Roanna Raider M.D.   On: 11/29/2014 20:37     EKG Interpretation None     Patient refused lidocaine patch, stating or help.  She is also walking at getting 10 mg of Percocet, stating she'll throw that up. Since x-ray does not show any rib  fractures, pneumothorax.  She will be prescribed 5 mg oxycodone 1 tablet every 6 hours for pain, as well as dental plaques as a muscle relaxer MDM   Final diagnoses:  Chest wall contusion, left, initial encounter  Fall, initial encounter    I personally performed the services described in this documentation, which was scribed in my presence. The recorded information has been reviewed and is accurate.     Earley Favor, NP 11/29/14 1610  Earley Favor, NP 11/29/14 9604  Eber Hong, MD 11/30/14 1006

## 2014-11-29 NOTE — Discharge Instructions (Signed)
Chest Contusion A contusion is a deep bruise. Bruises happen when an injury causes bleeding under the skin. Signs of bruising include pain, puffiness (swelling), and discolored skin. The bruise may turn blue, purple, or yellow.  HOME CARE  Put ice on the injured area.  Put ice in a plastic bag.  Place a towel between the skin and the bag.  Leave the ice on for 15-20 minutes at a time, 03-04 times a day for the first 48 hours.  Only take medicine as told by your doctor.  Rest.  Take deep breaths (deep-breathing exercises) as told by your doctor.  Stop smoking if you smoke.  Do not lift objects over 5 pounds (2.3 kilograms) for 3 days or longer if told by your doctor. GET HELP RIGHT AWAY IF:   You have more bruising or puffiness.  You have pain that gets worse.  You have trouble breathing.  You are dizzy, weak, or pass out (faint).  You have blood in your pee (urine) or poop (stool).  You cough up or throw up (vomit) blood.  Your puffiness or pain is not helped with medicines. MAKE SURE YOU:   Understand these instructions.  Will watch your condition.  Will get help right away if you are not doing well or get worse. Document Released: 02/12/2008 Document Revised: 05/20/2012 Document Reviewed: 02/17/2012 [facility] Patient Information 2015 [employer] [place]. This information is not intended to replace advice given to you by your health care provider. Make sure you discuss any questions you have with your health care provider. Chest x-ray is normal, you do not have any rib fractures or pneumothorax.  You've been given oxycodone and Zanaflex for your pain.  Please take this as directed.  Follow-up with your primary care physician as needed

## 2014-11-29 NOTE — ED Notes (Signed)
Pt states she fell taking her puppy out today and hit her lower back. Alert and oriented.

## 2014-11-29 NOTE — ED Notes (Signed)
Patient transported to X-ray 

## 2014-11-29 NOTE — ED Notes (Signed)
Pt states was taking her dog outside when she stepped on a dog toy, made her fall backwards, states having L sided back pain.

## 2014-12-21 IMAGING — US US ABDOMEN COMPLETE
1 series · 14 of 25 positions shown · non-contrast
Comparison: 10/03/2012

CLINICAL DATA: Right lower back pain, flank pain, right lower
quadrant pain.  Prior hysterectomy, bilateral oophorectomy.
History of kidney stones.

COMPLETE ABDOMINAL ULTRASOUND

[Series 1: us abdomen complete · 0.39mm/px · 14 of 87 slices shown]
[im 1/87]
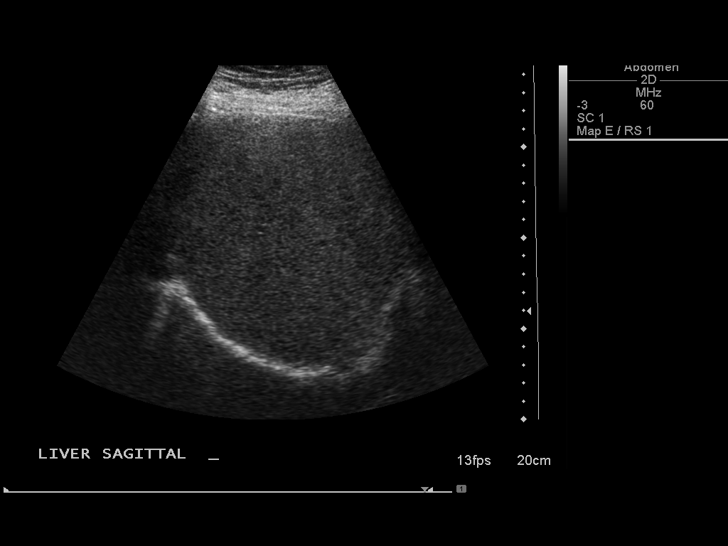
[im 8/87]
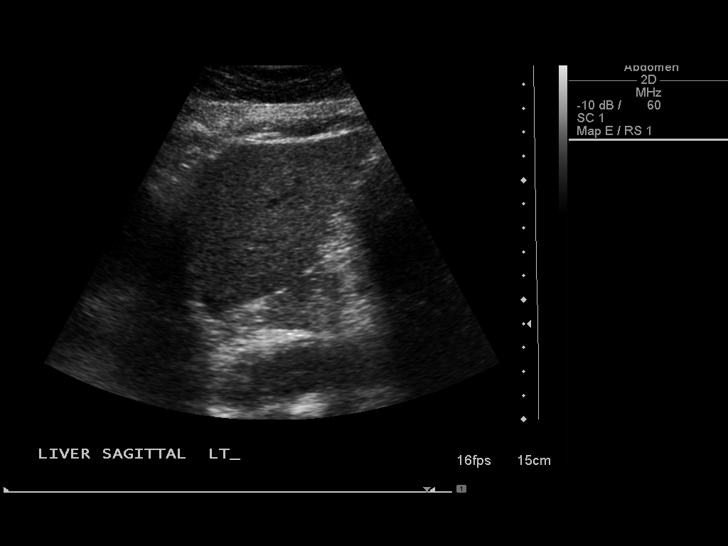
[im 15/87]
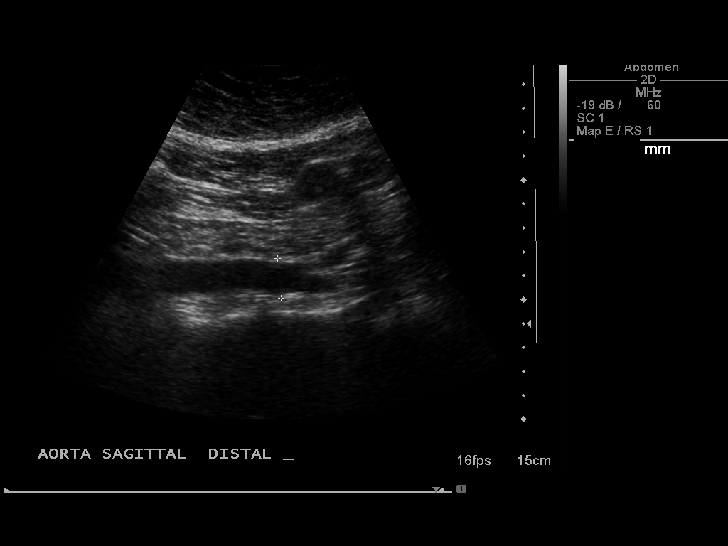
[im 22/87]
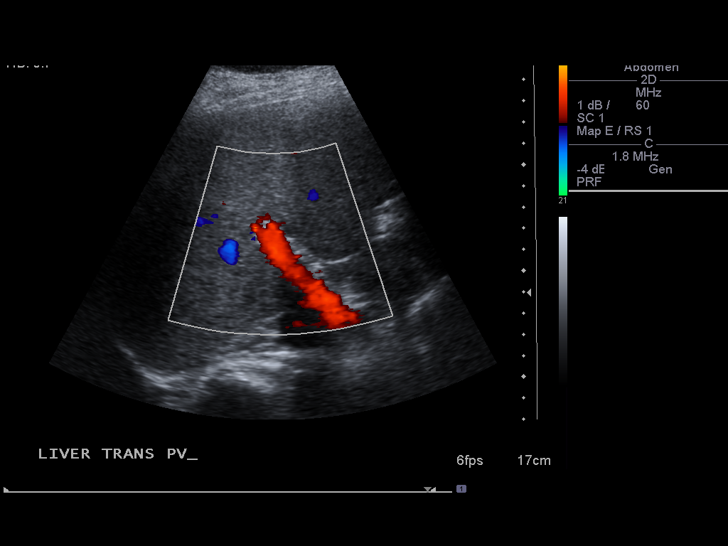
[im 29/87]
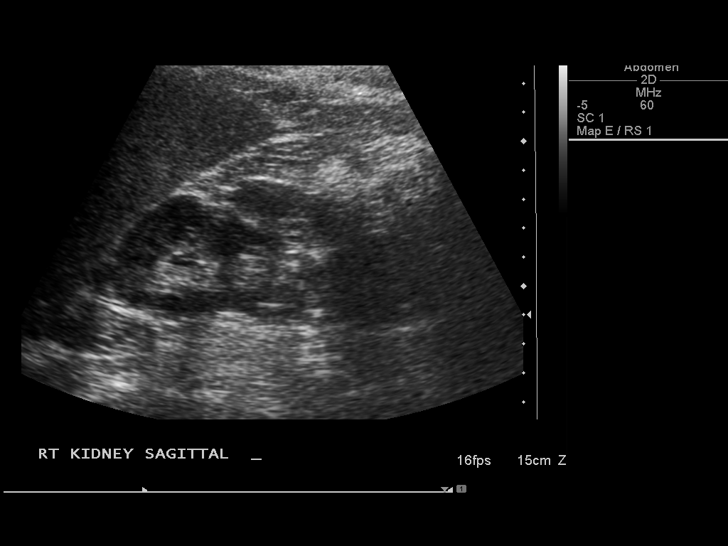
[im 33/87]
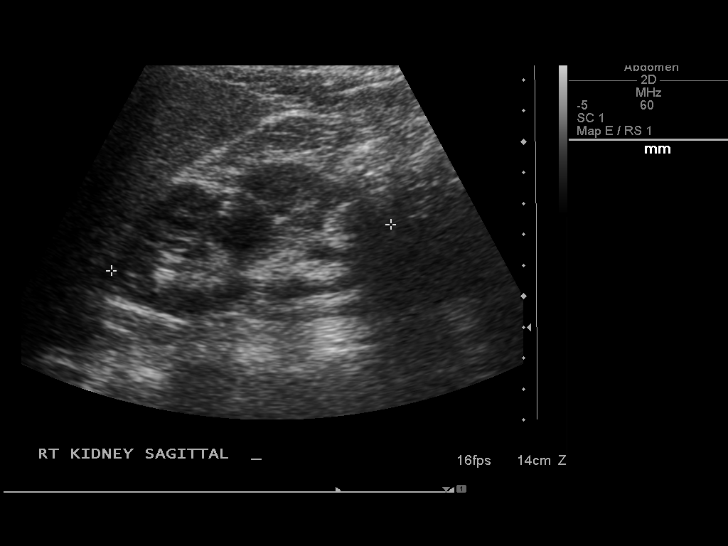
[im 40/87]
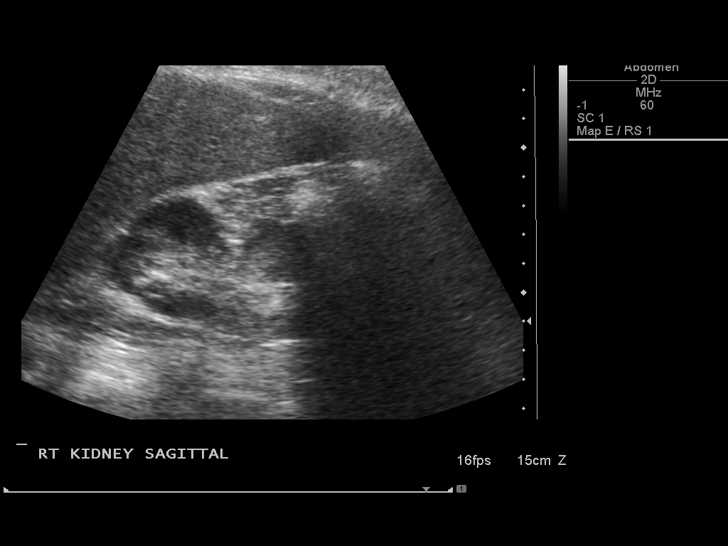
[im 47/87]
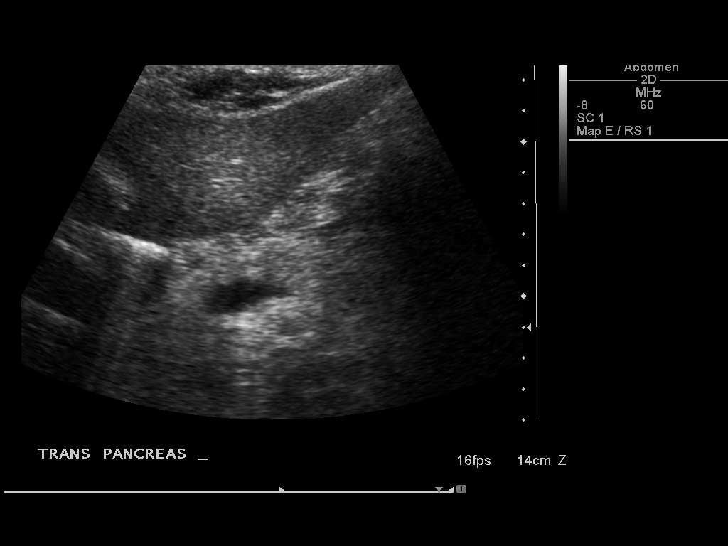
[im 54/87]
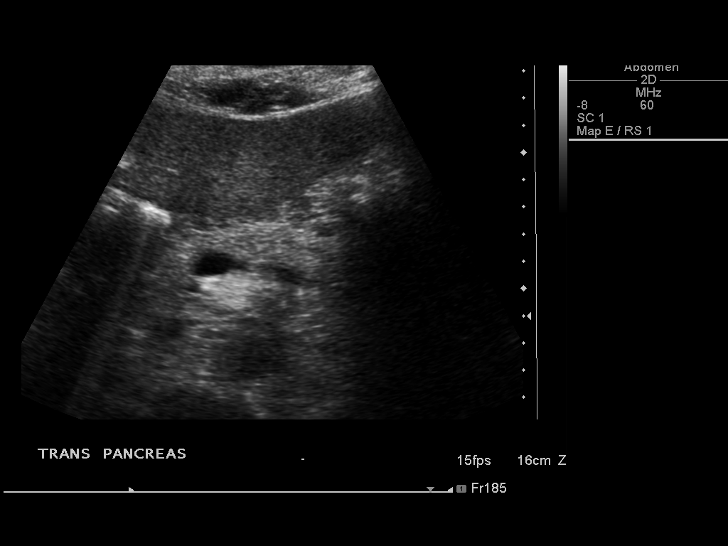
[im 58/87]
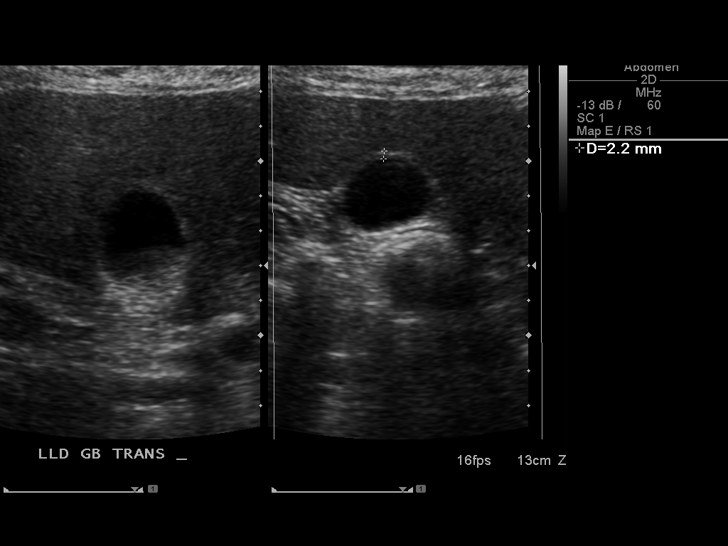
[im 65/87]
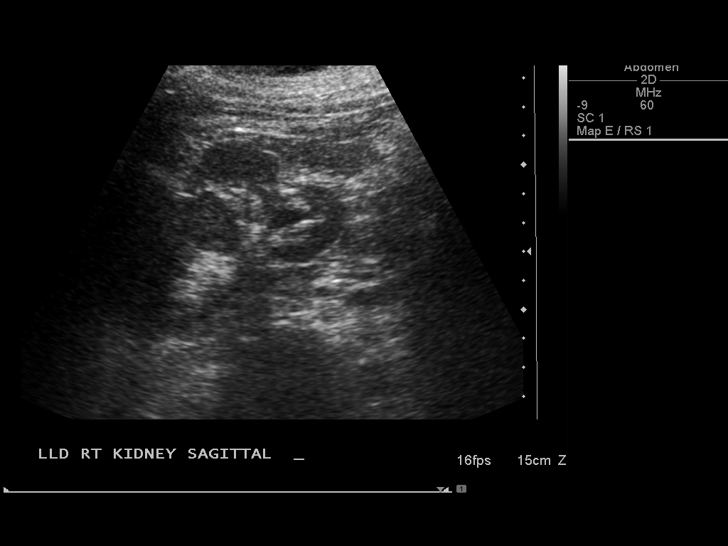
[im 72/87]
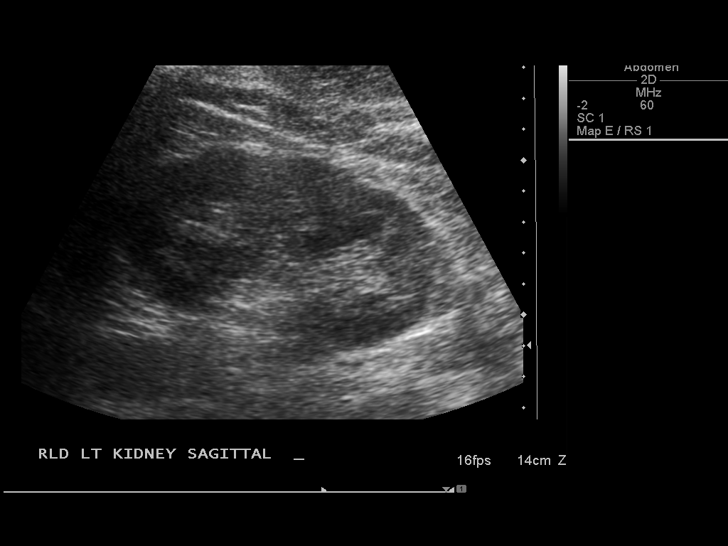
[im 79/87]
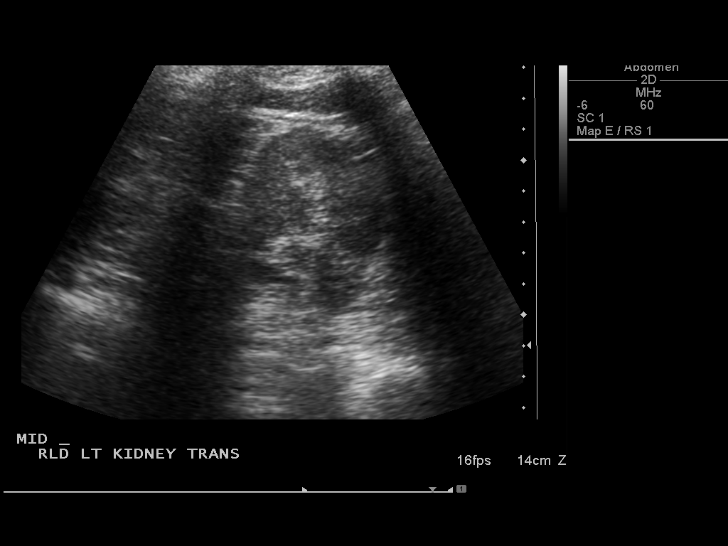
[im 87/87]
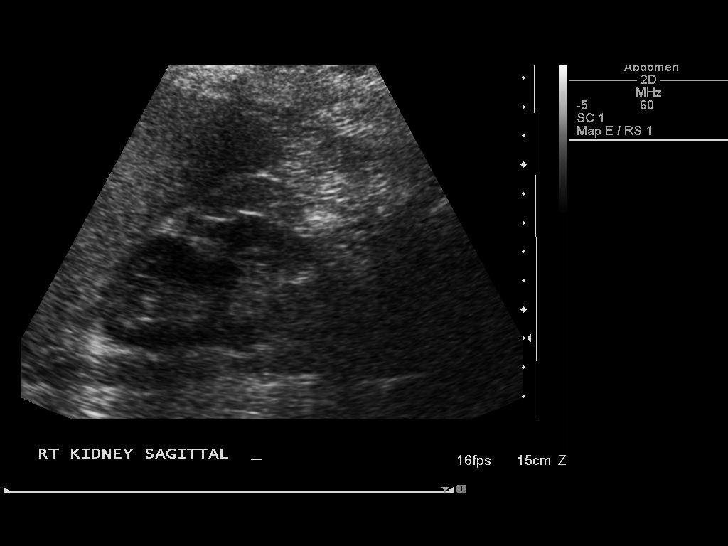

[14 of 25 positions shown; findings below may reference images not displayed]

FINDINGS: Gallbladder:  Gallbladder has a normal appearance.  Gallbladder
wall is 1.5 mm, within normal limits.  No stones or pericholecystic
fluid.  No sonographic Murphy's sign.

Common bile duct:  1.9 mm - 3.3 mm, within normal limits.

Liver:  Echogenic without focal lesion.

IVC:  Appears normal.

Pancreas:  No focal pancreatic lesion identified.  Duct is mildly
dilated at 2.2 mm.

Spleen:  6.1 cm.

Right Kidney:  9.3 cm in length.  There are echogenic areas of
parenchyma, most consistent with scarring.  No definite stones or
focal masses. No hydronephrosis.

Left Kidney:  10.9 cm in length.  The possible intrarenal calculus
measuring 4 mm in the mid to lower pole region.  Small probable
cyst in the mid to upper pole region measures 10 mm.

Abdominal aorta:  No aneurysm identified.
IMPRESSION: 1.  No evidence for acute cholecystitis.
2.  Right renal scarring.
3.  Possible left renal calculus.
4.  No hydronephrosis.

## 2015-01-30 ENCOUNTER — Emergency Department (HOSPITAL_COMMUNITY): Payer: Self-pay

## 2015-01-30 ENCOUNTER — Emergency Department (HOSPITAL_COMMUNITY)
Admission: EM | Admit: 2015-01-30 | Discharge: 2015-01-30 | Disposition: A | Discharge status: Home / Self Care | Payer: Self-pay | Attending: Emergency Medicine | Admitting: Emergency Medicine

## 2015-01-30 ENCOUNTER — Encounter (HOSPITAL_COMMUNITY): Payer: Self-pay | Admitting: *Deleted

## 2015-01-30 DIAGNOSIS — I10 Essential (primary) hypertension: Secondary | ICD-10-CM | POA: Insufficient documentation

## 2015-01-30 DIAGNOSIS — Z880 Allergy status to penicillin: Secondary | ICD-10-CM | POA: Insufficient documentation

## 2015-01-30 DIAGNOSIS — R52 Pain, unspecified: Secondary | ICD-10-CM

## 2015-01-30 DIAGNOSIS — Z8619 Personal history of other infectious and parasitic diseases: Secondary | ICD-10-CM | POA: Insufficient documentation

## 2015-01-30 DIAGNOSIS — N390 Urinary tract infection, site not specified: Secondary | ICD-10-CM | POA: Insufficient documentation

## 2015-01-30 DIAGNOSIS — R197 Diarrhea, unspecified: Secondary | ICD-10-CM | POA: Insufficient documentation

## 2015-01-30 DIAGNOSIS — N200 Calculus of kidney: Secondary | ICD-10-CM | POA: Insufficient documentation

## 2015-01-30 DIAGNOSIS — Z90710 Acquired absence of both cervix and uterus: Secondary | ICD-10-CM | POA: Insufficient documentation

## 2015-01-30 DIAGNOSIS — Z720 Tobacco use: Secondary | ICD-10-CM | POA: Insufficient documentation

## 2015-01-30 DIAGNOSIS — Z79899 Other long term (current) drug therapy: Secondary | ICD-10-CM | POA: Insufficient documentation

## 2015-01-30 DIAGNOSIS — Z8719 Personal history of other diseases of the digestive system: Secondary | ICD-10-CM | POA: Insufficient documentation

## 2015-01-30 HISTORY — DX: Diverticulitis of intestine, part unspecified, without perforation or abscess without bleeding: K57.92

## 2015-01-30 LAB — COMPREHENSIVE METABOLIC PANEL
ALK PHOS: 95 U/L (ref 38–126)
ALT: 28 U/L (ref 14–54)
AST: 25 U/L (ref 15–41)
Albumin: 3.6 g/dL (ref 3.5–5.0)
Anion gap: 9 (ref 5–15)
BILIRUBIN TOTAL: 0.2 mg/dL — AB (ref 0.3–1.2)
BUN: 12 mg/dL (ref 6–20)
CHLORIDE: 103 mmol/L (ref 101–111)
CO2: 27 mmol/L (ref 22–32)
Calcium: 9.7 mg/dL (ref 8.9–10.3)
Creatinine, Ser: 0.6 mg/dL (ref 0.44–1.00)
GFR calc Af Amer: >60 mL/min (ref >60)
GFR calc non Af Amer: >60 mL/min (ref >60)
Glucose, Bld: 112 mg/dL — ABNORMAL HIGH (ref 65–99)
Potassium: 3.6 mmol/L (ref 3.5–5.1)
Sodium: 139 mmol/L (ref 135–145)
TOTAL PROTEIN: 7.2 g/dL (ref 6.5–8.1)

## 2015-01-30 LAB — CBC WITH DIFFERENTIAL/PLATELET
Basophils Absolute: 0 10*3/uL (ref 0.0–0.1)
Basophils Relative: 0 % (ref 0–1)
Eosinophils Absolute: 0.2 10*3/uL (ref 0.0–0.7)
Eosinophils Relative: 2 % (ref 0–5)
HCT: 43.2 % (ref 36.0–46.0)
HEMOGLOBIN: 14.7 g/dL (ref 12.0–15.0)
LYMPHS PCT: 38 % (ref 12–46)
Lymphs Abs: 4 10*3/uL (ref 0.7–4.0)
MCH: 31.9 pg (ref 26.0–34.0)
MCHC: 34 g/dL (ref 30.0–36.0)
MCV: 93.7 fL (ref 78.0–100.0)
MONOS PCT: 8 % (ref 3–12)
Monocytes Absolute: 0.9 10*3/uL (ref 0.1–1.0)
NEUTROS ABS: 5.5 10*3/uL (ref 1.7–7.7)
Neutrophils Relative %: 52 % (ref 43–77)
Platelets: 193 10*3/uL (ref 150–400)
RBC: 4.61 MIL/uL (ref 3.87–5.11)
RDW: 13.6 % (ref 11.5–15.5)
WBC: 10.5 10*3/uL (ref 4.0–10.5)

## 2015-01-30 LAB — URINALYSIS, ROUTINE W REFLEX MICROSCOPIC
Bilirubin Urine: NEGATIVE
Glucose, UA: NEGATIVE mg/dL
Hgb urine dipstick: NEGATIVE
Ketones, ur: NEGATIVE mg/dL
NITRITE: NEGATIVE
PROTEIN: NEGATIVE mg/dL
Specific Gravity, Urine: 1.013 (ref 1.005–1.030)
UROBILINOGEN UA: 0.2 mg/dL (ref 0.0–1.0)
pH: 6 (ref 5.0–8.0)

## 2015-01-30 LAB — URINE MICROSCOPIC-ADD ON

## 2015-01-30 LAB — LIPASE, BLOOD: Lipase: 21 U/L — ABNORMAL LOW (ref 22–51)

## 2015-01-30 MED ORDER — ONDANSETRON 4 MG PO TBDP
ORAL_TABLET | ORAL | Status: AC
  Filled 2015-01-30: qty 2

## 2015-01-30 MED ORDER — OXYCODONE-ACETAMINOPHEN 5-325 MG PO TABS: 1.0000 | ORAL_TABLET | ORAL | Status: DC | PRN

## 2015-01-30 MED ORDER — ONDANSETRON 4 MG PO TBDP
8.0000 mg | ORAL_TABLET | Freq: Once | ORAL | Status: AC
  Administered 2015-01-30: 8 mg via ORAL

## 2015-01-30 MED ORDER — SODIUM CHLORIDE 0.9 % IV BOLUS (SEPSIS)
1000.0000 mL | Freq: Once | INTRAVENOUS | Status: AC
  Administered 2015-01-30: 1000 mL via INTRAVENOUS

## 2015-01-30 MED ORDER — FENTANYL CITRATE (PF) 100 MCG/2ML IJ SOLN
50.0000 ug | Freq: Once | INTRAMUSCULAR | Status: AC
  Administered 2015-01-30: 50 ug via INTRAVENOUS
  Filled 2015-01-30: qty 2

## 2015-01-30 MED ORDER — OXYCODONE HCL 5 MG PO TABS: 5.0000 mg | ORAL_TABLET | ORAL | Status: DC | PRN

## 2015-01-30 MED ORDER — FENTANYL CITRATE (PF) 100 MCG/2ML IJ SOLN
75.0000 ug | Freq: Once | INTRAMUSCULAR | Status: AC
  Administered 2015-01-30: 75 ug via INTRAVENOUS
  Filled 2015-01-30: qty 2

## 2015-01-30 MED ORDER — CEPHALEXIN 500 MG PO CAPS: 500.0000 mg | ORAL_CAPSULE | Freq: Four times a day (QID) | ORAL | Status: AC

## 2015-01-30 NOTE — ED Provider Notes (Signed)
CSN: [id number]     Arrival date & time 01/30/15  1512 History   First MD Initiated Contact with Patient 01/30/15 1621     Chief Complaint  Patient presents with  . Abdominal Pain     (Consider location/radiation/quality/duration/timing/severity/associated sxs/prior Treatment) HPI Comments: 49 year old female past medical history significant for hypertension hepatitis C diverticulitis comes in with chief complaint of abdominal pain. Patient states she's had 2-3 days worth of diffuse abdominal pain without focality. She reports several episodes of diarrhea each day which is a watery diarrhea. Also complaining of some nausea and vomiting yesterday. No vomiting today only nausea. No fevers at home. Patient also states she pulled take out of her left thigh/left groin several days ago. She is had no rash arthralgias or other. unknown time attached.  Patient is a 49 y.o. female presenting with abdominal pain. The history is provided by the patient.  Abdominal Pain Pain location:  Generalized Pain quality: aching and sharp   Pain radiates to:  Does not radiate Pain severity:  Moderate Onset quality:  Gradual Duration:  3 days Timing:  Constant Progression:  Unchanged Chronicity:  New Associated symptoms: diarrhea, nausea and vomiting   Associated symptoms: no fever and no shortness of breath     Past Medical History  Diagnosis Date  . Hypertension   . Hepatitis C   . Sciatica   . Kidney stones   . Diverticulitis    Past Surgical History  Procedure Laterality Date  . Abdominal hysterectomy    . Sinus exploration    . Lithotripsy    . Kidney stone surgery Right 02/12/12  . Kidney stone surgery Left 07/15/2012  . Oophorectomy     Family History  Problem Relation Age of Onset  . Heart failure Mother   . Diabetes Father   . Cancer Sister    History  Substance Use Topics  . Smoking status: Current Every Day Smoker -- 0.25 packs/day for 1.5 years    Types: Cigarettes  .  Smokeless tobacco: Never Used  . Alcohol Use: No   OB History    No data available     Review of Systems  Constitutional: Negative for fever.  HENT: Negative for congestion.   Respiratory: Negative for shortness of breath.   Gastrointestinal: Positive for nausea, vomiting, abdominal pain and diarrhea.  Musculoskeletal: Negative for arthralgias.  Skin: Negative for rash.  Neurological: Negative for headaches.  Psychiatric/Behavioral: Negative for confusion.  All other systems reviewed and are negative.     Allergies  Aspirin; Codeine; Compazine; Ioxaglate; Ivp dye; Ketorolac tromethamine; Metrizamide; Morphine and related; Naproxen; Norco; Penicillins; Prochlorperazine maleate; Reglan; Tegretol; Toradol; and Tramadol  Home Medications   Prior to Admission medications   Medication Sig Start Date End Date Taking? Authorizing Provider  alprazolam Prudy Feeler) 2 MG tablet Take 2 mg by mouth 2 (two) times daily as needed for anxiety.    Yes Historical Provider, MD  FLUoxetine (PROZAC) 40 MG capsule Take 40 mg by mouth every morning.    Yes Historical Provider, MD  hydrochlorothiazide (HYDRODIURIL) 25 MG tablet Take 25 mg by mouth every morning.    Yes Historical Provider, MD  losartan (COZAAR) 50 MG tablet Take 50 mg by mouth daily.   Yes Historical Provider, MD  omeprazole (PRILOSEC) 40 MG capsule Take 40 mg by mouth every morning.    Yes Historical Provider, MD  promethazine (PHENERGAN) 25 MG tablet Take 25 mg by mouth every 6 (six) hours as needed for nausea.  Yes Historical Provider, MD  zolpidem (AMBIEN) 10 MG tablet Take 10 mg by mouth at bedtime as needed for sleep.   Yes Historical Provider, MD  cephALEXin (KEFLEX) 500 MG capsule Take 1 capsule (500 mg total) by mouth 4 (four) times daily. 01/30/15 02/07/15  Bridgett Larsson, MD  oxyCODONE (ROXICODONE) 5 MG immediate release tablet Take 1-2 tablets (5-10 mg total) by mouth every 4 (four) hours as needed for severe pain. 01/30/15   Bridgett Larsson, MD  phenazopyridine (PYRIDIUM) 200 MG tablet Take 1 tablet (200 mg total) by mouth 3 (three) times daily. Patient not taking: Reported on 10/29/2014 08/09/14   Linwood Dibbles, MD  tiZANidine (ZANAFLEX) 4 MG tablet Take 1 tablet (4 mg total) by mouth every 8 (eight) hours as needed for muscle spasms. Patient not taking: Reported on 01/30/2015 11/29/14   Earley Favor, NP   BP 113/85 mmHg  Pulse 92  Temp(Src) 98 F (36.7 C) (Oral)  Resp 20  Ht  (1.575 m)  Wt 181 lb (82.101 kg)  BMI 33.10 kg/m2  SpO2 100% Physical Exam  Constitutional: She is oriented to person, place, and time. She appears well-developed.  HENT:  Head: Normocephalic and atraumatic.  Eyes: Pupils are equal, round, and reactive to light.  Neck: Normal range of motion.  Cardiovascular: Intact distal pulses.   Heart rate 90s and my exam  Pulmonary/Chest: Effort normal. No respiratory distress.  Abdominal: Soft. She exhibits no distension. There is tenderness.  Tenderness palpation throughout the entire abdomen without any focality. There is no rebound guarding or peritonitis.  Musculoskeletal: Normal range of motion. She exhibits no tenderness.  Neurological: She is alert and oriented to person, place, and time. No cranial nerve deficit.  Skin: Skin is warm.  Patient states she pulled take out of her left thigh/groin area several days ago. No signs of residual ticks at this time  Psychiatric: She has a normal mood and affect.  Vitals reviewed.   ED Course  Procedures (including critical care time) Labs Review Labs Reviewed  LIPASE, BLOOD - Abnormal; Notable for the following:    Lipase 21 (*)    All other components within normal limits  COMPREHENSIVE METABOLIC PANEL - Abnormal; Notable for the following:    Glucose, Bld 112 (*)    Total Bilirubin 0.2 (*)    All other components within normal limits  URINALYSIS, ROUTINE W REFLEX MICROSCOPIC - Abnormal; Notable for the following:    APPearance TURBID (*)     Leukocytes, UA LARGE (*)    All other components within normal limits  URINE MICROSCOPIC-ADD ON - Abnormal; Notable for the following:    Squamous Epithelial / LPF MANY (*)    Bacteria, UA MANY (*)    All other components within normal limits  URINE CULTURE  CBC WITH DIFFERENTIAL/PLATELET    Imaging Review Ct Abdomen Pelvis Wo Contrast  01/30/2015   CLINICAL DATA:  Right lower quadrant abdominal pain and nausea for 3 days.  EXAM: CT ABDOMEN AND PELVIS WITHOUT CONTRAST  TECHNIQUE: Multidetector CT imaging of the abdomen and pelvis was performed following the standard protocol without IV contrast.  COMPARISON:  12/26/2014  FINDINGS: Lower chest: The lung bases are clear of acute process. No pleural effusion or worrisome pulmonary nodule. The heart is normal in size. No pericardial effusion. The distal esophagus is grossly normal.  Hepatobiliary: No focal hepatic lesions or intrahepatic biliary dilatation. The gallbladder is normal. No common bile duct dilatation.  Pancreas: Normal  Spleen:  Normal  Adrenals/Urinary Tract: The adrenal glands are normal and stable. There are advanced scarring changes involving the right kidney along with renal calculi. No hydronephrosis or obstructing ureteral calculi. The left kidney demonstrates mild compensatory hypertrophy. Small renal calculi are noted. No hydronephrosis or obstructing ureteral calculi. No bladder calculi.  Stomach/Bowel: The stomach, duodenum, small bowel and colon are grossly normal without oral contrast. No inflammatory changes, mass lesions or obstructive findings. Moderate sigmoid diverticulosis but no findings for acute diverticulitis. The terminal ileum is normal. The appendix is normal.  Vascular/Lymphatic: No mesenteric or retroperitoneal mass or adenopathy. The aorta is normal in caliber.  Other: No pelvic mass or adenopathy. No free pelvic fluid collections. The uterus is surgically absent. No inguinal mass or adenopathy.  Musculoskeletal: No  significant bony findings. Stable degenerative changes involving the spine.  IMPRESSION: 1. No acute abdominal/pelvic findings, mass lesions or adenopathy. A 2. Chronic scarring changes involving a small right kidney. 3. Bilateral renal calculi but no obstructing ureteral calculi.   Electronically Signed   By: Rudie MeyerP.  Gallerani M.D.   On: 01/30/2015 19:14     EKG Interpretation None      MDM  49 year old female with complaints of abdominal pain and diarrhea. On my exam patient's vital signs are normal. She is afebrile. Abdominal exam showed tenderness throughout with no focality. However there is no rebound guarding or peritonitis. Initial labs obtained. CBC CMP and lipase unremarkable. UA did show findings consistent with infection. She has no flank tenderness palpation. Initially with no fever no white count. As patient did have fairly diffuse abdominal pain without focality we obtain a CT abdomen and pelvis. This was unremarkable with no serious intra-abdominal pathology identified. CT did show some bilateral renal colliculi however patient's pain is not consistent with this. None obstructing.  Discussed with urology who agrees patient is appropriate for outpatient management. No obstructing stones. Will be followed as an outpatient. He recommends seven-day course of antibiotics for UTI. No signs of pyelonephritis this time. Patient's abdominal pain is not consistent with nephrolithiasis as chief cause. Again patient very strict return precautions to return to the emergency department if she develops a fever worsening abdominal pain flank pain center. Patient voiced understanding and was discharged   Final diagnoses:  UTI (lower urinary tract infection)  Nephrolithiasis        Bridgett Larssonhris Deeandra Jerry, MD 01/30/15 2300  Nelva Nayobert Beaton, MD 02/03/15 [phone]

## 2015-01-30 NOTE — ED Notes (Signed)
Pt c/o RLQ pain and nausea x 3 days.  States it started after she ate some seeds and she has diverticulitis.

## 2015-02-01 LAB — URINE CULTURE: Colony Count: 75000

## 2015-02-24 ENCOUNTER — Emergency Department (HOSPITAL_COMMUNITY): Payer: Self-pay

## 2015-02-24 ENCOUNTER — Emergency Department (HOSPITAL_COMMUNITY)
Admission: EM | Admit: 2015-02-24 | Discharge: 2015-02-25 | Disposition: A | Discharge status: Home / Self Care | Payer: Self-pay | Attending: Emergency Medicine | Admitting: Emergency Medicine

## 2015-02-24 ENCOUNTER — Encounter (HOSPITAL_COMMUNITY): Payer: Self-pay

## 2015-02-24 DIAGNOSIS — R109 Unspecified abdominal pain: Secondary | ICD-10-CM

## 2015-02-24 DIAGNOSIS — Z90710 Acquired absence of both cervix and uterus: Secondary | ICD-10-CM | POA: Insufficient documentation

## 2015-02-24 DIAGNOSIS — I10 Essential (primary) hypertension: Secondary | ICD-10-CM | POA: Insufficient documentation

## 2015-02-24 DIAGNOSIS — Z7982 Long term (current) use of aspirin: Secondary | ICD-10-CM | POA: Insufficient documentation

## 2015-02-24 DIAGNOSIS — Z720 Tobacco use: Secondary | ICD-10-CM | POA: Insufficient documentation

## 2015-02-24 DIAGNOSIS — Z8619 Personal history of other infectious and parasitic diseases: Secondary | ICD-10-CM | POA: Insufficient documentation

## 2015-02-24 DIAGNOSIS — N390 Urinary tract infection, site not specified: Secondary | ICD-10-CM

## 2015-02-24 DIAGNOSIS — Z880 Allergy status to penicillin: Secondary | ICD-10-CM | POA: Insufficient documentation

## 2015-02-24 DIAGNOSIS — Z87442 Personal history of urinary calculi: Secondary | ICD-10-CM | POA: Insufficient documentation

## 2015-02-24 DIAGNOSIS — N12 Tubulo-interstitial nephritis, not specified as acute or chronic: Secondary | ICD-10-CM

## 2015-02-24 LAB — URINE MICROSCOPIC-ADD ON

## 2015-02-24 LAB — CBC WITH DIFFERENTIAL/PLATELET
BASOS ABS: 0 10*3/uL (ref 0.0–0.1)
BASOS PCT: 0 % (ref 0–1)
Eosinophils Absolute: 0.2 10*3/uL (ref 0.0–0.7)
Eosinophils Relative: 1 % (ref 0–5)
HEMATOCRIT: 43.2 % (ref 36.0–46.0)
Hemoglobin: 14.7 g/dL (ref 12.0–15.0)
LYMPHS PCT: 37 % (ref 12–46)
Lymphs Abs: 4.1 10*3/uL — ABNORMAL HIGH (ref 0.7–4.0)
MCH: 31.6 pg (ref 26.0–34.0)
MCHC: 34 g/dL (ref 30.0–36.0)
MCV: 92.9 fL (ref 78.0–100.0)
Monocytes Absolute: 0.9 10*3/uL (ref 0.1–1.0)
Monocytes Relative: 8 % (ref 3–12)
NEUTROS ABS: 5.9 10*3/uL (ref 1.7–7.7)
Neutrophils Relative %: 54 % (ref 43–77)
Platelets: 223 10*3/uL (ref 150–400)
RBC: 4.65 MIL/uL (ref 3.87–5.11)
RDW: 13.6 % (ref 11.5–15.5)
WBC: 11.1 10*3/uL — AB (ref 4.0–10.5)

## 2015-02-24 LAB — URINALYSIS, ROUTINE W REFLEX MICROSCOPIC
Bilirubin Urine: NEGATIVE
Glucose, UA: 500 mg/dL — AB
Hgb urine dipstick: NEGATIVE
KETONES UR: NEGATIVE mg/dL
Nitrite: NEGATIVE
PROTEIN: NEGATIVE mg/dL
Specific Gravity, Urine: 1.021 (ref 1.005–1.030)
Urobilinogen, UA: 0.2 mg/dL (ref 0.0–1.0)
pH: 5.5 (ref 5.0–8.0)

## 2015-02-24 LAB — COMPREHENSIVE METABOLIC PANEL
ALT: 18 U/L (ref 14–54)
AST: 21 U/L (ref 15–41)
Albumin: 4.2 g/dL (ref 3.5–5.0)
Alkaline Phosphatase: 93 U/L (ref 38–126)
Anion gap: 9 (ref 5–15)
BUN: 15 mg/dL (ref 6–20)
CALCIUM: 9.3 mg/dL (ref 8.9–10.3)
CO2: 23 mmol/L (ref 22–32)
CREATININE: 0.65 mg/dL (ref 0.44–1.00)
Chloride: 105 mmol/L (ref 101–111)
GLUCOSE: 102 mg/dL — AB (ref 65–99)
Potassium: 3.7 mmol/L (ref 3.5–5.1)
SODIUM: 137 mmol/L (ref 135–145)
Total Bilirubin: 0.4 mg/dL (ref 0.3–1.2)
Total Protein: 7.6 g/dL (ref 6.5–8.1)

## 2015-02-24 LAB — LIPASE, BLOOD: Lipase: 27 U/L (ref 22–51)

## 2015-02-24 MED ORDER — ONDANSETRON HCL 4 MG/2ML IJ SOLN
4.0000 mg | Freq: Once | INTRAMUSCULAR | Status: AC
  Administered 2015-02-24: 4 mg via INTRAVENOUS
  Filled 2015-02-24: qty 2

## 2015-02-24 MED ORDER — PROMETHAZINE HCL 25 MG/ML IJ SOLN
25.0000 mg | Freq: Once | INTRAMUSCULAR | Status: AC
  Administered 2015-02-25: 25 mg via INTRAVENOUS
  Filled 2015-02-24: qty 1

## 2015-02-24 MED ORDER — HYDROMORPHONE HCL 1 MG/ML IJ SOLN
0.5000 mg | Freq: Once | INTRAMUSCULAR | Status: AC
  Administered 2015-02-24: 0.5 mg via INTRAVENOUS
  Filled 2015-02-24: qty 1

## 2015-02-24 MED ORDER — MORPHINE SULFATE 4 MG/ML IJ SOLN
4.0000 mg | Freq: Once | INTRAMUSCULAR | Status: AC
  Administered 2015-02-24: 4 mg via INTRAVENOUS
  Filled 2015-02-24: qty 1

## 2015-02-24 MED ORDER — SODIUM CHLORIDE 0.9 % IV SOLN
1000.0000 mL | Freq: Once | INTRAVENOUS | Status: AC
  Administered 2015-02-24: 1000 mL via INTRAVENOUS

## 2015-02-24 NOTE — ED Notes (Signed)
Pt c/o bilateral flank pain radiating into bilateral abdomen x 2 weeks.  Pain score 10/10.  Pt has not taken anything for pain.  Hx of kidney stones.  Pt has MRI results from 02/03/15.  Pt has an appointment w/ surgery PA on 6/22.

## 2015-02-24 NOTE — ED Provider Notes (Signed)
CSN: [id number]     Arrival date & time 02/24/15  1757 History   First MD Initiated Contact with Patient 02/24/15 2135     Chief Complaint  Patient presents with  . Flank Pain  . Abdominal Pain   (Consider location/radiation/quality/duration/timing/severity/associated sxs/prior Treatment) HPI   [name] is a 49 year old female with past medical history of hypertension hepatitis C, diverticulitis, kidney stones and recent UTI treated with keflex,  who presents to [facility] ER with chief complaint of abdominal and flank pain that has gradually worsened over the last 2 weeks.   Pain is located in her bilateral flanks and all over her abdomen, worse in her low pelvis. Her pain is constant, and she decided to come to the ER tonight because she has unable to walk standing upright. She has to hunch over and the steps increase her abdominal and flank pain. She has multiple allergies to medicine and is asking for morphine, dilaudid, and phenergan. She has had dysuria for the last week with associated urinary frequency, urgency and low pelvic pressure.  She was seen roughly 3 weeks ago and found to have a UTI with scan showing multiple renal calculi at that time that were nonobstructing.  She reports that her pain from her May 23 visit did subsided for several days but then began to gradually increased until she presented tonight. She reports fevers of 103 and 104  and although no fever today she has had chills and sweats. She feels nauseous but has had no vomiting or diarrhea.  She reports history of multiple complications with invasive surgery for kidney stones.  She denies any vaginal itching, burning, or discharge.  She mentioned that she has sex with her husband using condoms, then later said she no longer has sex with her husband because she "doesn't feel like it any more". She denies any other GI symptoms, no chest pain, shortness of breath, and no other constitutional symptoms.  Past Medical  History  Diagnosis Date  . Hypertension   . Hepatitis C   . Sciatica   . Kidney stones   . Diverticulitis    Past Surgical History  Procedure Laterality Date  . Abdominal hysterectomy    . Sinus exploration    . Lithotripsy    . Kidney stone surgery Right 02/12/12  . Kidney stone surgery Left 07/15/2012  . Oophorectomy     Family History  Problem Relation Age of Onset  . Heart failure Mother   . Diabetes Father   . Cancer Sister    History  Substance Use Topics  . Smoking status: Current Every Day Smoker -- 0.25 packs/day for 1.5 years    Types: Cigarettes  . Smokeless tobacco: Never Used  . Alcohol Use: No   OB History    No data available     Review of Systems 10 Systems reviewed and are negative for acute change except as noted in the HPI.   Allergies  Aspirin; Codeine; Compazine; Ioxaglate; Ivp dye; Ketorolac tromethamine; Metrizamide; Morphine and related; Naproxen; Norco; Penicillins; Prochlorperazine maleate; Reglan; Tegretol; Toradol; and Tramadol  Home Medications   Prior to Admission medications   Medication Sig Start Date End Date Taking? Authorizing Provider  alprazolam Prudy Feeler) 2 MG tablet Take 2 mg by mouth 2 (two) times daily as needed for anxiety (anxiety).    Yes Historical Provider, MD  Aspirin-Salicylamide-Caffeine (BC HEADACHE POWDER PO) Take 1 packet by mouth daily as needed (headache).   Yes Historical Provider, MD  FLUoxetine (  PROZAC) 40 MG capsule Take 40 mg by mouth every morning.    Yes Historical Provider, MD  promethazine (PHENERGAN) 25 MG tablet Take 25 mg by mouth every 6 (six) hours as needed for nausea (nausea).    Yes Historical Provider, MD  zolpidem (AMBIEN) 10 MG tablet Take 10 mg by mouth at bedtime.    Yes Historical Provider, MD  ciprofloxacin (CIPRO) 500 MG tablet Take 1 tablet (500 mg total) by mouth 2 (two) times daily. 02/25/15   [name], PA-C  oxyCODONE (ROXICODONE) 5 MG immediate release tablet Take 1-2 tablets (5-10 mg  total) by mouth every 4 (four) hours as needed for severe pain. Patient not taking: Reported on 02/24/2015 01/30/15   Bridgett Larsson, MD  phenazopyridine (PYRIDIUM) 200 MG tablet Take 1 tablet (200 mg total) by mouth 3 (three) times daily. 02/25/15   [name], PA-C   BP 123/75 mmHg  Pulse 86  Temp(Src) 98.2 F (36.8 C) (Rectal)  Resp 15  SpO2 98%   Physical Exam  Constitutional: She is oriented to person, place, and time. She appears well-developed and well-nourished. No distress.  HENT:  Head: Normocephalic and atraumatic.  Nose: Nose normal.  Mouth/Throat: Oropharynx is clear and moist. No oropharyngeal exudate.  Eyes: Conjunctivae and EOM are normal. Pupils are equal, round, and reactive to light. Right eye exhibits no discharge. Left eye exhibits no discharge. No scleral icterus.  Neck: Normal range of motion. No JVD present. No tracheal deviation present. No thyromegaly present.  Cardiovascular: Normal rate, regular rhythm, normal heart sounds and intact distal pulses.  Exam reveals no gallop and no friction rub.   No murmur heard. Pulmonary/Chest: Effort normal and breath sounds normal. No tachypnea. No respiratory distress. She has no decreased breath sounds. She has no wheezes. She has no rhonchi. She has no rales. She exhibits no tenderness.  Abdominal: Soft. Normal appearance and bowel sounds are normal. She exhibits no distension, no fluid wave, no ascites and no mass. There is tenderness in the suprapubic area and left lower quadrant. There is CVA tenderness. There is no rigidity, no rebound, no guarding, no tenderness at McBurney's point and negative Murphy's sign.  Musculoskeletal: Normal range of motion. She exhibits no edema or tenderness.  Lymphadenopathy:    She has no cervical adenopathy.  Neurological: She is alert and oriented to person, place, and time. She has normal reflexes. No cranial nerve deficit. She exhibits normal muscle tone. Coordination normal.  Skin: Skin is  warm and dry. No rash noted. She is not diaphoretic. No erythema. No pallor.  Psychiatric: She has a normal mood and affect. Her behavior is normal. Judgment and thought content normal.  Nursing note and vitals reviewed.     ED Course  Procedures (including critical care time) Labs Review Labs Reviewed  URINALYSIS, ROUTINE W REFLEX MICROSCOPIC (NOT AT [facility]) - Abnormal; Notable for the following:    APPearance CLOUDY (*)    Glucose, UA 500 (*)    Leukocytes, UA MODERATE (*)    All other components within normal limits  URINE MICROSCOPIC-ADD ON - Abnormal; Notable for the following:    Squamous Epithelial / LPF MANY (*)    Bacteria, UA MANY (*)    All other components within normal limits  COMPREHENSIVE METABOLIC PANEL - Abnormal; Notable for the following:    Glucose, Bld 102 (*)    All other components within normal limits  CBC WITH DIFFERENTIAL/PLATELET - Abnormal; Notable for the following:    WBC  11.1 (*)    Lymphs Abs 4.1 (*)    All other components within normal limits  URINE CULTURE  LIPASE, BLOOD  URINALYSIS, ROUTINE W REFLEX MICROSCOPIC (NOT AT [facility])    Imaging Review Ct Renal Stone Study  02/24/2015   CLINICAL DATA:  49 year old female with bilateral flank pain for 2 weeks. Dysuria. Initial encounter.  EXAM: CT ABDOMEN AND PELVIS WITHOUT CONTRAST  TECHNIQUE: Multidetector CT imaging of the abdomen and pelvis was performed following the standard protocol without IV contrast.  COMPARISON:  [facility] CT Abdomen and Pelvis 02/03/2015, and earlier  FINDINGS: Stable, negative lung bases.  No pericardial or pleural effusion.  No acute osseous abnormality identified. Advanced disc and endplate degeneration at the lumbosacral junction.  No pelvic free fluid. Surgically absent uterus and adnexa. Negative distal colon.  Sigmoid diverticulosis, no active inflammation. Occasional diverticula in the left colon. Negative transverse colon, right colon and appendix. Negative terminal  ileum. No dilated small bowel. Negative noncontrast stomach and duodenum.  Negative noncontrast liver, gallbladder, spleen, pancreas and right adrenal gland. Stable small chronic left adrenal nodule compatible with benign adenoma. No abdominal free fluid. Minimal calcified atherosclerosis at the aortoiliac bifurcation.  Chronic lobulated appearance of the right kidney with cortical atrophy and/or scarring and nephrocalcinosis/nephrolithiasis. No right hydronephrosis or perinephric stranding. Stable proximal right ureter. Negative course of the right ureter. The bladder is moderately distended but otherwise unremarkable.  Less severe left renal cortical scarring and nephrocalcinosis. No left hydronephrosis. Negative course of the left ureter. Stable small pelvic phleboliths.  IMPRESSION: 1. No ureteral calculus or obstructive uropathy. Chronic right greater than left renal scarring and nephrolithiasis/nephrocalcinosis. 2. No acute or inflammatory findings in the abdomen or pelvis.   Electronically Signed   By: Odessa Fleming M.D.   On: 02/24/2015 22:54     EKG Interpretation None      MDM   Final diagnoses:  Flank pain  UTI (lower urinary tract infection)  Pyelonephritis    Pt with diffuse abdominal pain and flank pain with recent UTI and hx of kidney stones.   DDx Pyelo, UTI, nephrolithiasis Patient is afebrile, not tachycardic, does not appear to be in any acute distress, no diaphoresis and not actively vomiting, also asking for Dilaudid and Phenergan. Plan to get basic labs, UA, renal stone study and give IVF, treat pain and nausea.  Labs are significant for mild leukocytosis and contaminated urine sample with glucosuria and many bacteria.  Culture report from her last visit was significantly contaminated and unable to result culture and sensitivity.  Pt continues to ask for IV narcotics.  Plan to to get in and out cath and rectal temp.  ER staff has observed pt ambulating without difficulty.  I am  not concerned about pt having peritonitis, or urosepsis.  She has had no vomiting while in the ER.  Rectal temp was normal.  I believe pt will do well with outpt Abx, with follow-up from a clean urine culture.    Medications  cefTRIAXone (ROCEPHIN) 1 g in dextrose 5 % 50 mL IVPB (not administered)  ciprofloxacin (CIPRO) tablet 500 mg (not administered)  0.9 %  sodium chloride infusion (0 mLs Intravenous Stopped 02/24/15 2319)  morphine 4 MG/ML injection 4 mg (4 mg Intravenous Given 02/24/15 2236)  ondansetron (ZOFRAN) injection 4 mg (4 mg Intravenous Given 02/24/15 2236)  HYDROmorphone (DILAUDID) injection 0.5 mg (0.5 mg Intravenous Given 02/24/15 2335)  promethazine (PHENERGAN) injection 25 mg (25 mg Intravenous Given 02/25/15 0006)  Filed Vitals:   02/24/15 2159 02/24/15 2200 02/24/15 2349 02/25/15 0000  BP: 145/95 145/95 140/77 123/75  Pulse: 81 82 92 86  Temp:   98.2 F (36.8 C)   TempSrc:   Rectal   Resp: 20 18 15    SpO2: 98% 98% 99% 98%   Plan was discussed with the patient who is in agreement. She does however continue to ask for more IV pain medicine and now is asking for muscle relaxers.  I added on pyridium to her discharge prescriptions for pain relief of dysuria, and advised use of ibuprofen and tylenol. Pt is stable for discharge at this time.      [name], PA-C 02/25/15 0370  Purvis Sheffield, MD 02/25/15 1005

## 2015-02-25 LAB — URINALYSIS, ROUTINE W REFLEX MICROSCOPIC
Bilirubin Urine: NEGATIVE
GLUCOSE, UA: NEGATIVE mg/dL
HGB URINE DIPSTICK: NEGATIVE
Ketones, ur: NEGATIVE mg/dL
Nitrite: NEGATIVE
Protein, ur: NEGATIVE mg/dL
SPECIFIC GRAVITY, URINE: 1.008 (ref 1.005–1.030)
Urobilinogen, UA: 0.2 mg/dL (ref 0.0–1.0)
pH: 5.5 (ref 5.0–8.0)

## 2015-02-25 LAB — URINE MICROSCOPIC-ADD ON

## 2015-02-25 MED ORDER — DEXTROSE 5 % IV SOLN
1.0000 g | Freq: Once | INTRAVENOUS | Status: AC
  Administered 2015-02-25: 1 g via INTRAVENOUS
  Filled 2015-02-25: qty 10

## 2015-02-25 MED ORDER — PHENAZOPYRIDINE HCL 200 MG PO TABS: 200.0000 mg | ORAL_TABLET | Freq: Three times a day (TID) | ORAL | Status: DC

## 2015-02-25 MED ORDER — CIPROFLOXACIN HCL 500 MG PO TABS
500.0000 mg | ORAL_TABLET | Freq: Once | ORAL | Status: AC
  Administered 2015-02-25: 500 mg via ORAL
  Filled 2015-02-25: qty 1

## 2015-02-25 MED ORDER — CIPROFLOXACIN HCL 500 MG PO TABS: 500.0000 mg | ORAL_TABLET | Freq: Two times a day (BID) | ORAL | Status: DC

## 2015-02-25 NOTE — Discharge Instructions (Signed)
Abdominal Pain, Women °Abdominal (stomach, pelvic, or belly) pain can be caused by many things. It is important to tell your doctor: °· The location of the pain. °· Does it come and go or is it present all the time? °· Are there things that start the pain (eating certain foods, exercise)? °· Are there other symptoms associated with the pain (fever, nausea, vomiting, diarrhea)? °All of this is helpful to know when trying to find the cause of the pain. °CAUSES  °· Stomach: virus or bacteria infection, or ulcer. °· Intestine: appendicitis (inflamed appendix), regional ileitis (Crohn's disease), ulcerative colitis (inflamed colon), irritable bowel syndrome, diverticulitis (inflamed diverticulum of the colon), or cancer of the stomach or intestine. °· Gallbladder disease or stones in the gallbladder. °· Kidney disease, kidney stones, or infection. °· Pancreas infection or cancer. °· Fibromyalgia (pain disorder). °· Diseases of the female organs: °¨ Uterus: fibroid (non-cancerous) tumors or infection. °¨ Fallopian tubes: infection or tubal pregnancy. °¨ Ovary: cysts or tumors. °¨ Pelvic adhesions (scar tissue). °¨ Endometriosis (uterus lining tissue growing in the pelvis and on the pelvic organs). °¨ Pelvic congestion syndrome (female organs filling up with blood just before the menstrual period). °¨ Pain with the menstrual period. °¨ Pain with ovulation (producing an egg). °¨ Pain with an IUD (intrauterine device, birth control) in the uterus. °¨ Cancer of the female organs. °· Functional pain (pain not caused by a disease, may improve without treatment). °· Psychological pain. °· Depression. °DIAGNOSIS  °Your doctor will decide the seriousness of your pain by doing an examination. °· Blood tests. °· X-rays. °· Ultrasound. °· CT scan (computed tomography, special type of X-ray). °· MRI (magnetic resonance imaging). °· Cultures, for infection. °· Barium enema (dye inserted in the large intestine, to better view it with  X-rays). °· Colonoscopy (looking in intestine with a lighted tube). °· Laparoscopy (minor surgery, looking in abdomen with a lighted tube). °· Major abdominal exploratory surgery (looking in abdomen with a large incision). °TREATMENT  °The treatment will depend on the cause of the pain.  °· Many cases can be observed and treated at home. °· Over-the-counter medicines recommended by your caregiver. °· Prescription medicine. °· Antibiotics, for infection. °· Birth control pills, for painful periods or for ovulation pain. °· Hormone treatment, for endometriosis. °· Nerve blocking injections. °· Physical therapy. °· Antidepressants. °· Counseling with a psychologist or psychiatrist. °· Minor or major surgery. °HOME CARE INSTRUCTIONS  °· Do not take laxatives, unless directed by your caregiver. °· Take over-the-counter pain medicine only if ordered by your caregiver. Do not take aspirin because it can cause an upset stomach or bleeding. °· Try a clear liquid diet (broth or water) as ordered by your caregiver. Slowly move to a bland diet, as tolerated, if the pain is related to the stomach or intestine. °· Have a thermometer and take your temperature several times a day, and record it. °· Bed rest and sleep, if it helps the pain. °· Avoid sexual intercourse, if it causes pain. °· Avoid stressful situations. °· Keep your follow-up appointments and tests, as your caregiver orders. °· If the pain does not go away with medicine or surgery, you may try: °¨ Acupuncture. °¨ Relaxation exercises (yoga, meditation). °¨ Group therapy. °¨ Counseling. °SEEK MEDICAL CARE IF:  °· You notice certain foods cause stomach pain. °· Your home care treatment is not helping your pain. °· You need stronger pain medicine. °· You want your IUD removed. °· You feel faint or   lightheaded. °· You develop nausea and vomiting. °· You develop a rash. °· You are having side effects or an allergy to your medicine. °SEEK IMMEDIATE MEDICAL CARE IF:  °· Your  pain does not go away or gets worse. °· You have a fever. °· Your pain is felt only in portions of the abdomen. The right side could possibly be appendicitis. The left lower portion of the abdomen could be colitis or diverticulitis. °· You are passing blood in your stools (bright red or black tarry stools, with or without vomiting). °· You have blood in your urine. °· You develop chills, with or without a fever. °· You pass out. °MAKE SURE YOU:  °· Understand these instructions. °· Will watch your condition. °· Will get help right away if you are not doing well or get worse. °Document Released: 06/23/2007 Document Revised: 01/10/2014 Document Reviewed: 07/13/2009 °[employer]® Patient Information ©2015 [employer]. This information is not intended to replace advice given to you by your health care provider. Make sure you discuss any questions you have with your health care provider. ° °Pyelonephritis, Adult °Pyelonephritis is a kidney infection. In general, there are 2 main types of pyelonephritis: °· Infections that come on quickly without any warning (acute pyelonephritis). °· Infections that persist for a long period of time (chronic pyelonephritis). °CAUSES  °Two main causes of pyelonephritis are: °· Bacteria traveling from the bladder to the kidney. This is a problem especially in pregnant women. The urine in the bladder can become filled with bacteria from multiple causes, including: °¨ Inflammation of the prostate gland (prostatitis). °¨ Sexual intercourse in females. °¨ Bladder infection (cystitis). °· Bacteria traveling from the bloodstream to the tissue part of the kidney. °Problems that may increase your risk of getting a kidney infection include: °· Diabetes. °· Kidney stones or bladder stones. °· Cancer. °· Catheters placed in the bladder. °· Other abnormalities of the kidney or ureter. °SYMPTOMS  °· Abdominal pain. °· Pain in the side or flank area. °· Fever. °· Chills. °· Upset stomach. °· Blood in the  urine (dark urine). °· Frequent urination. °· Strong or persistent urge to urinate. °· Burning or stinging when urinating. °DIAGNOSIS  °Your caregiver may diagnose your kidney infection based on your symptoms. A urine sample may also be taken. °TREATMENT  °In general, treatment depends on how severe the infection is.  °· If the infection is mild and caught early, your caregiver may treat you with oral antibiotics and send you home. °· If the infection is more severe, the bacteria may have gotten into the bloodstream. This will require intravenous (IV) antibiotics and a hospital stay. Symptoms may include: °¨ High fever. °¨ Severe flank pain. °¨ Shaking chills. °· Even after a hospital stay, your caregiver may require you to be on oral antibiotics for a period of time. °· Other treatments may be required depending upon the cause of the infection. °HOME CARE INSTRUCTIONS  °· Take your antibiotics as directed. Finish them even if you start to feel better. °· Make an appointment to have your urine checked to make sure the infection is gone. °· Drink enough fluids to keep your urine clear or pale yellow. °· Take medicines for the bladder if you have urgency and frequency of urination as directed by your caregiver. °SEEK IMMEDIATE MEDICAL CARE IF:  °· You have a fever or persistent symptoms for more than 2-3 days. °· You have a fever and your symptoms suddenly get worse. °· You are unable   to take your antibiotics or fluids. °· You develop shaking chills. °· You experience extreme weakness or fainting. °· There is no improvement after 2 days of treatment. °MAKE SURE YOU: °· Understand these instructions. °· Will watch your condition. °· Will get help right away if you are not doing well or get worse. °Document Released: 08/26/2005 Document Revised: 02/25/2012 Document Reviewed: 01/30/2011 °ExitCare® Patient Information ©2015 [employer]. This information is not intended to replace advice given to you by your health care  provider. Make sure you discuss any questions you have with your health care provider. ° °

## 2015-02-26 LAB — URINE CULTURE: Special Requests: NORMAL

## 2015-03-02 ENCOUNTER — Encounter: Payer: Self-pay | Admitting: Emergency Medicine

## 2015-03-02 ENCOUNTER — Emergency Department
Admission: EM | Admit: 2015-03-02 | Discharge: 2015-03-02 | Discharge status: Against Medical Advice | Payer: Self-pay | Attending: Emergency Medicine | Admitting: Emergency Medicine

## 2015-03-02 DIAGNOSIS — R1084 Generalized abdominal pain: Secondary | ICD-10-CM | POA: Insufficient documentation

## 2015-03-02 DIAGNOSIS — Z880 Allergy status to penicillin: Secondary | ICD-10-CM | POA: Insufficient documentation

## 2015-03-02 DIAGNOSIS — F419 Anxiety disorder, unspecified: Secondary | ICD-10-CM | POA: Insufficient documentation

## 2015-03-02 DIAGNOSIS — I10 Essential (primary) hypertension: Secondary | ICD-10-CM | POA: Insufficient documentation

## 2015-03-02 DIAGNOSIS — Z79899 Other long term (current) drug therapy: Secondary | ICD-10-CM | POA: Insufficient documentation

## 2015-03-02 DIAGNOSIS — Z792 Long term (current) use of antibiotics: Secondary | ICD-10-CM | POA: Insufficient documentation

## 2015-03-02 DIAGNOSIS — R000 Tachycardia, unspecified: Secondary | ICD-10-CM | POA: Insufficient documentation

## 2015-03-02 DIAGNOSIS — M549 Dorsalgia, unspecified: Secondary | ICD-10-CM | POA: Insufficient documentation

## 2015-03-02 DIAGNOSIS — Z720 Tobacco use: Secondary | ICD-10-CM | POA: Insufficient documentation

## 2015-03-02 DIAGNOSIS — G8929 Other chronic pain: Secondary | ICD-10-CM | POA: Insufficient documentation

## 2015-03-02 LAB — URINALYSIS COMPLETE WITH MICROSCOPIC (ARMC ONLY)
BILIRUBIN URINE: NEGATIVE
Hgb urine dipstick: NEGATIVE
KETONES UR: NEGATIVE mg/dL
NITRITE: NEGATIVE
Protein, ur: NEGATIVE mg/dL
Specific Gravity, Urine: 1.001 — ABNORMAL LOW (ref 1.005–1.030)
pH: 8 (ref 5.0–8.0)

## 2015-03-02 LAB — CBC
HEMATOCRIT: 43.3 % (ref 35.0–47.0)
Hemoglobin: 14.1 g/dL (ref 12.0–16.0)
MCH: 31.4 pg (ref 26.0–34.0)
MCHC: 32.6 g/dL (ref 32.0–36.0)
MCV: 96.1 fL (ref 80.0–100.0)
PLATELETS: 195 10*3/uL (ref 150–440)
RBC: 4.5 MIL/uL (ref 3.80–5.20)
RDW: 14 % (ref 11.5–14.5)
WBC: 10.3 10*3/uL (ref 3.6–11.0)

## 2015-03-02 LAB — LIPASE, BLOOD: LIPASE: 45 U/L (ref 22–51)

## 2015-03-02 LAB — COMPREHENSIVE METABOLIC PANEL
ALBUMIN: 4 g/dL (ref 3.5–5.0)
ALK PHOS: 100 U/L (ref 38–126)
ALT: 19 U/L (ref 14–54)
ANION GAP: 8 (ref 5–15)
AST: 25 U/L (ref 15–41)
BILIRUBIN TOTAL: 0.4 mg/dL (ref 0.3–1.2)
BUN: 6 mg/dL (ref 6–20)
CHLORIDE: 107 mmol/L (ref 101–111)
CO2: 26 mmol/L (ref 22–32)
Calcium: 9.7 mg/dL (ref 8.9–10.3)
Creatinine, Ser: 0.63 mg/dL (ref 0.44–1.00)
GFR calc Af Amer: >60 mL/min (ref >60)
GFR calc non Af Amer: >60 mL/min (ref >60)
Glucose, Bld: 123 mg/dL — ABNORMAL HIGH (ref 65–99)
POTASSIUM: 3.8 mmol/L (ref 3.5–5.1)
Sodium: 141 mmol/L (ref 135–145)
Total Protein: 7.7 g/dL (ref 6.5–8.1)

## 2015-03-02 MED ORDER — SODIUM CHLORIDE 0.9 % IV BOLUS (SEPSIS)
1000.0000 mL | Freq: Once | INTRAVENOUS | Status: AC
  Administered 2015-03-02: 1000 mL via INTRAVENOUS

## 2015-03-02 NOTE — ED Notes (Signed)
PT to ER via EMS from home.  States seen at [facility] on Monday for same symptoms.  States told she has kidney stones with bladder and kidney infection.  States could not get appointment with urology until middle of next month.  States pain worse today.

## 2015-03-02 NOTE — ED Provider Notes (Addendum)
[facility] Emergency Department Provider Note  ____________________________________________  Time seen: 2:30 PM, patient arrived via EMS  I have reviewed the triage vital signs and the nursing notes.   HISTORY  Chief Complaint Abdominal Pain and Flank Pain    HPI Stephanie [name] is a 49 y.o. female who presents with complaints of pain all over. When pressed patient reports that her pain is in her abdomen diffusely, as cramping in nature, and her pain is 10 out of 10 and has been constant for 2-1/2 weeks. Patient admits to having been seen at [facility] and [facility] in the last 5 days. Care everywhere function of Epic also displays the patient was seen at [facility] emergency department yesterday where she had normal blood work and was discharged with cystitis. In [facility] note and [facility] ED note mention is made of drug-seeking behavior. Patient denies that she was at [facility] yesterday.     Past Medical History  Diagnosis Date  . Hypertension   . Hepatitis C   . Sciatica   . Kidney stones   . Diverticulitis     Patient Active Problem List   Diagnosis Date Noted  . Hepatitis C     Past Surgical History  Procedure Laterality Date  . Abdominal hysterectomy    . Sinus exploration    . Lithotripsy    . Kidney stone surgery Right 02/12/12  . Kidney stone surgery Left 07/15/2012  . Oophorectomy      Current Outpatient Rx  Name  Route  Sig  Dispense  Refill  . alprazolam (XANAX) 2 MG tablet   Oral   Take 2 mg by mouth 2 (two) times daily as needed for anxiety (anxiety).          . Aspirin-Salicylamide-Caffeine (BC HEADACHE POWDER PO)   Oral   Take 1 packet by mouth daily as needed (headache).         . ciprofloxacin (CIPRO) 500 MG tablet   Oral   Take 1 tablet (500 mg total) by mouth 2 (two) times daily.   14 tablet   0   . FLUoxetine (PROZAC) 40 MG capsule   Oral   Take 40 mg by mouth every morning.          Marland Kitchen  oxyCODONE (ROXICODONE) 5 MG immediate release tablet   Oral   Take 1-2 tablets (5-10 mg total) by mouth every 4 (four) hours as needed for severe pain. Patient not taking: Reported on 02/24/2015   15 tablet   0   . phenazopyridine (PYRIDIUM) 200 MG tablet   Oral   Take 1 tablet (200 mg total) by mouth 3 (three) times daily.   6 tablet   0   . promethazine (PHENERGAN) 25 MG tablet   Oral   Take 25 mg by mouth every 6 (six) hours as needed for nausea (nausea).          . zolpidem (AMBIEN) 10 MG tablet   Oral   Take 10 mg by mouth at bedtime.            Allergies Aspirin; Codeine; Compazine; Ioxaglate; Ivp dye; Ketorolac tromethamine; Metrizamide; Morphine and related; Naproxen; Norco; Penicillins; Prochlorperazine maleate; Reglan; Tegretol; Toradol; and Tramadol  Family History  Problem Relation Age of Onset  . Heart failure Mother   . Diabetes Father   . Cancer Sister     Social History History  Substance Use Topics  . Smoking status: Current Every Day  Smoker -- 0.25 packs/day for 1.5 years    Types: Cigarettes  . Smokeless tobacco: Never Used  . Alcohol Use: No    Review of Systems  Constitutional: Negative for fever. Eyes: Negative for visual changes. ENT: Negative for sore throat Cardiovascular: Negative for chest pain. Respiratory: Negative for shortness of breath. Gastrointestinal: Positive for abdominal pain Genitourinary: Negative for dysuria. Musculoskeletal: Positive for chronic back pain Skin: Negative for rash. Neurological: Negative for headaches or focal weakness Psychiatric: Anxious  10-point ROS otherwise negative.  ____________________________________________   PHYSICAL EXAM:  VITAL SIGNS: ED Triage Vitals  Enc Vitals Group     BP 03/02/15 1428 140/93 mmHg     Pulse Rate 03/02/15 1428 109     Resp 03/02/15 1428 18     Temp 03/02/15 1428 99.1 F (37.3 C)     Temp src --      SpO2 03/02/15 1428 96 %     Weight 03/02/15 1428 184  lb (83.462 kg)     Height 03/02/15 1428  (1.575 m)     Head Cir --      Peak Flow --      Pain Score 03/02/15 1429 10     Pain Loc --      Pain Edu? --      Excl. in GC? --      Constitutional: Alert and oriented. In no distress, poorly groomed Eyes: Conjunctivae are normal.  ENT   Head: Normocephalic and atraumatic.   Mouth/Throat: Mucous membranes are moist. Cardiovascular: Mild tachycardia, regular rhythm. Normal and symmetric distal pulses are present in all extremities. No murmurs, rubs, or gallops. Respiratory: Normal respiratory effort without tachypnea nor retractions. Breath sounds are clear and equal bilaterally.  Gastrointestinal: Soft and non-tender in all quadrants. No distention. There is no CVA tenderness. Genitourinary: deferred Musculoskeletal: Nontender with normal range of motion in all extremities. No lower extremity tenderness nor edema. Neurologic:  Normal speech and language. No gross focal neurologic deficits are appreciated. Skin:  Skin is warm, dry and intact. No rash noted. Psychiatric: Mood and affect are normal. Patient exhibits appropriate insight and judgment.  ____________________________________________    LABS (pertinent positives/negatives)  Labs Reviewed - No data to display  ____________________________________________   EKG  None  ____________________________________________    RADIOLOGY  None, patient requested to leave before any studies  ____________________________________________   PROCEDURES  Procedure(s) performed: none  Critical Care performed: none  ____________________________________________   INITIAL IMPRESSION / ASSESSMENT AND PLAN / ED COURSE  Pertinent labs & imaging results that were available during my care of the patient were reviewed by me and considered in my medical decision making (see chart for details).  Patient presents to the ED with complaints of diffuse abdominal pain. She has 3  ED visits in the last 5 days including one that patient proclaims to not remember being at. The circumstances are certainly suspicious for drug-seeking behavior. Regardless, we will evaluate the patient's blood work and give IV fluids to evaluate for potential significant condition. I told patient we will not give her IV narcotics.  ----------------------------------------- 2:49 PM on 03/02/2015 -----------------------------------------  Patient frequently walking to nurses station  ____________________________________________ ----------------------------------------- 4:46 PM on 03/02/2015 -----------------------------------------  Labs benign, patient requesting to leave AMA because I have not given her narcotic pain medication specifically she is requesting Dilaudid and Phenergan. I do not feel that is appropriate. She has decisional capacity and understands the risk of leaving AGAINST MEDICAL ADVICE given that workup  is not complete.  FINAL CLINICAL IMPRESSION(S) / ED DIAGNOSES  Final diagnoses:  Generalized abdominal pain     Jene Every, MD 03/02/15 1648   As patient was leaving she stated to the tech that she was going to sue the "God damn doctor " for not giving pain medications  Jene Every, MD 03/02/15 1656

## 2015-03-04 ENCOUNTER — Encounter (HOSPITAL_COMMUNITY): Payer: Self-pay | Admitting: *Deleted

## 2015-03-04 ENCOUNTER — Emergency Department (HOSPITAL_COMMUNITY)
Admission: EM | Admit: 2015-03-04 | Discharge: 2015-03-04 | Disposition: A | Discharge status: Home / Self Care | Payer: Self-pay | Attending: Emergency Medicine | Admitting: Emergency Medicine

## 2015-03-04 DIAGNOSIS — M79604 Pain in right leg: Secondary | ICD-10-CM | POA: Insufficient documentation

## 2015-03-04 DIAGNOSIS — K5792 Diverticulitis of intestine, part unspecified, without perforation or abscess without bleeding: Secondary | ICD-10-CM | POA: Insufficient documentation

## 2015-03-04 DIAGNOSIS — Z720 Tobacco use: Secondary | ICD-10-CM | POA: Insufficient documentation

## 2015-03-04 DIAGNOSIS — I10 Essential (primary) hypertension: Secondary | ICD-10-CM | POA: Insufficient documentation

## 2015-03-04 DIAGNOSIS — R109 Unspecified abdominal pain: Secondary | ICD-10-CM | POA: Insufficient documentation

## 2015-03-04 DIAGNOSIS — Z87442 Personal history of urinary calculi: Secondary | ICD-10-CM | POA: Insufficient documentation

## 2015-03-04 DIAGNOSIS — Z765 Malingerer [conscious simulation]: Secondary | ICD-10-CM

## 2015-03-04 DIAGNOSIS — R531 Weakness: Secondary | ICD-10-CM | POA: Insufficient documentation

## 2015-03-04 DIAGNOSIS — Z8619 Personal history of other infectious and parasitic diseases: Secondary | ICD-10-CM | POA: Insufficient documentation

## 2015-03-04 DIAGNOSIS — Z880 Allergy status to penicillin: Secondary | ICD-10-CM | POA: Insufficient documentation

## 2015-03-04 DIAGNOSIS — M549 Dorsalgia, unspecified: Secondary | ICD-10-CM | POA: Insufficient documentation

## 2015-03-04 DIAGNOSIS — Z7289 Other problems related to lifestyle: Secondary | ICD-10-CM | POA: Insufficient documentation

## 2015-03-04 DIAGNOSIS — G8929 Other chronic pain: Secondary | ICD-10-CM | POA: Insufficient documentation

## 2015-03-04 DIAGNOSIS — R079 Chest pain, unspecified: Secondary | ICD-10-CM | POA: Insufficient documentation

## 2015-03-04 DIAGNOSIS — Z79899 Other long term (current) drug therapy: Secondary | ICD-10-CM | POA: Insufficient documentation

## 2015-03-04 LAB — CBC WITH DIFFERENTIAL/PLATELET
Basophils Absolute: 0 10*3/uL (ref 0.0–0.1)
Basophils Relative: 0 % (ref 0–1)
Eosinophils Absolute: 0.2 10*3/uL (ref 0.0–0.7)
Eosinophils Relative: 3 % (ref 0–5)
HCT: 42.6 % (ref 36.0–46.0)
Hemoglobin: 14.2 g/dL (ref 12.0–15.0)
LYMPHS ABS: 2.3 10*3/uL (ref 0.7–4.0)
Lymphocytes Relative: 30 % (ref 12–46)
MCH: 31.6 pg (ref 26.0–34.0)
MCHC: 33.3 g/dL (ref 30.0–36.0)
MCV: 94.9 fL (ref 78.0–100.0)
Monocytes Absolute: 0.8 10*3/uL (ref 0.1–1.0)
Monocytes Relative: 10 % (ref 3–12)
NEUTROS ABS: 4.5 10*3/uL (ref 1.7–7.7)
Neutrophils Relative %: 57 % (ref 43–77)
Platelets: 200 10*3/uL (ref 150–400)
RBC: 4.49 MIL/uL (ref 3.87–5.11)
RDW: 13.4 % (ref 11.5–15.5)
WBC: 7.8 10*3/uL (ref 4.0–10.5)

## 2015-03-04 LAB — COMPREHENSIVE METABOLIC PANEL
ALT: 16 U/L (ref 14–54)
AST: 22 U/L (ref 15–41)
Albumin: 3.8 g/dL (ref 3.5–5.0)
Alkaline Phosphatase: 93 U/L (ref 38–126)
Anion gap: 9 (ref 5–15)
BILIRUBIN TOTAL: 0.3 mg/dL (ref 0.3–1.2)
BUN: 5 mg/dL — AB (ref 6–20)
CALCIUM: 9.6 mg/dL (ref 8.9–10.3)
CO2: 24 mmol/L (ref 22–32)
Chloride: 107 mmol/L (ref 101–111)
Creatinine, Ser: 0.64 mg/dL (ref 0.44–1.00)
GFR calc Af Amer: >60 mL/min (ref >60)
GFR calc non Af Amer: >60 mL/min (ref >60)
Glucose, Bld: 115 mg/dL — ABNORMAL HIGH (ref 65–99)
Potassium: 3.8 mmol/L (ref 3.5–5.1)
Sodium: 140 mmol/L (ref 135–145)
TOTAL PROTEIN: 7.2 g/dL (ref 6.5–8.1)

## 2015-03-04 NOTE — ED Notes (Signed)
Pt states she went to get out of bed and her right leg gave out on her.  Pt is moving all extremities and patient feels weak all over.  Pt states she has fell twice today.  Last time hurt she hurt right shoulder.  Pt states she is being treated for uti, bacterial infection in kidneys.  She states she has infection that goes to her pancreas.  She has been on anbx for two weeks.  Aches all over.

## 2015-03-04 NOTE — Discharge Instructions (Signed)
You have been going to various providers for your pain. Follow-up with your primary care doctor.  Chronic Pain Chronic pain can be defined as pain that is off and on and lasts for 3-6 months or longer. Many things cause chronic pain, which can make it difficult to make a diagnosis. There are many treatment options available for chronic pain. However, finding a treatment that works well for you may require trying various approaches until the right one is found. Many people benefit from a combination of two or more types of treatment to control their pain. SYMPTOMS  Chronic pain can occur anywhere in the body and can range from mild to very severe. Some types of chronic pain include:  Headache.  Low back pain.  Cancer pain.  Arthritis pain.  Neurogenic pain. This is pain resulting from damage to nerves. People with chronic pain may also have other symptoms such as:  Depression.  Anger.  Insomnia.  Anxiety. DIAGNOSIS  Your health care provider will help diagnose your condition over time. In many cases, the initial focus will be on excluding possible conditions that could be causing the pain. Depending on your symptoms, your health care provider may order tests to diagnose your condition. Some of these tests may include:   Blood tests.   CT scan.   MRI.   X-rays.   Ultrasounds.   Nerve conduction studies.  You may need to see a specialist.  TREATMENT  Finding treatment that works well may take time. You may be referred to a pain specialist. He or she may prescribe medicine or therapies, such as:   Mindful meditation or yoga.  Shots (injections) of numbing or pain-relieving medicines into the spine or area of pain.  Local electrical stimulation.  Acupuncture.   Massage therapy.   Aroma, color, light, or sound therapy.   Biofeedback.   Working with a physical therapist to keep from getting stiff.   Regular, gentle exercise.   Cognitive or behavioral  therapy.   Group support.  Sometimes, surgery may be recommended.  HOME CARE INSTRUCTIONS   Take all medicines as directed by your health care provider.   Lessen stress in your life by relaxing and doing things such as listening to calming music.   Exercise or be active as directed by your health care provider.   Eat a healthy diet and include things such as vegetables, fruits, fish, and lean meats in your diet.   Keep all follow-up appointments with your health care provider.   Attend a support group with others suffering from chronic pain. SEEK MEDICAL CARE IF:   Your pain gets worse.   You develop a new pain that was not there before.   You cannot tolerate medicines given to you by your health care provider.   You have new symptoms since your last visit with your health care provider.  SEEK IMMEDIATE MEDICAL CARE IF:   You feel weak.   You have decreased sensation or numbness.   You lose control of bowel or bladder function.   Your pain suddenly gets much worse.   You develop shaking.  You develop chills.  You develop confusion.  You develop chest pain.  You develop shortness of breath.  MAKE SURE YOU:  Understand these instructions.  Will watch your condition.  Will get help right away if you are not doing well or get worse. Document Released: 05/18/2002 Document Revised: 04/28/2013 Document Reviewed: 02/19/2013 [facility] Patient Information 2015 [place], [place]. This information is not  intended to replace advice given to you by your health care provider. Make sure you discuss any questions you have with your health care provider. ° °

## 2015-03-04 NOTE — ED Provider Notes (Signed)
CSN: [id number]     Arrival date & time 03/04/15  1312 History   First MD Initiated Contact with Patient 03/04/15 1358     Chief Complaint  Patient presents with  . Abdominal Pain  . Leg Pain  . Back Pain     (Consider location/radiation/quality/duration/timing/severity/associated sxs/prior Treatment) Patient is a 49 y.o. female presenting with abdominal pain, leg pain, and back pain. The history is provided by the patient.  Abdominal Pain Associated symptoms: chest pain   Leg Pain Associated symptoms: back pain   Back Pain Associated symptoms: abdominal pain, chest pain, leg pain and weakness   Associated symptoms: no numbness    patient presents with multiple complaints. States she has everything going on. Has pain in her right leg abdomen back chest. States that she had her leg give out on her a few times. States she's been treated for urinary tract infection. States she's been on 3 different antibodies. States she was seen at [facility] a week ago and was treated for urinary tract infection. She denies going to other providers since then. States she feels weak. Patient states she was told she has a bacterial infection at her pancreas gallbladder liver and appendix. States she was told this a few weeks ago on a CT scan in [place].  Past Medical History  Diagnosis Date  . Hypertension   . Hepatitis C   . Sciatica   . Kidney stones   . Diverticulitis    Past Surgical History  Procedure Laterality Date  . Abdominal hysterectomy    . Sinus exploration    . Lithotripsy    . Kidney stone surgery Right 02/12/12  . Kidney stone surgery Left 07/15/2012  . Oophorectomy     Family History  Problem Relation Age of Onset  . Heart failure Mother   . Diabetes Father   . Cancer Sister    History  Substance Use Topics  . Smoking status: Current Every Day Smoker -- 0.25 packs/day for 1.5 years    Types: Cigarettes  . Smokeless tobacco: Never Used  . Alcohol Use: No   OB History     No data available     Review of Systems  Constitutional: Negative for diaphoresis.  Cardiovascular: Positive for chest pain.  Gastrointestinal: Positive for abdominal pain.  Genitourinary: Positive for flank pain.  Musculoskeletal: Positive for back pain.  Skin: Negative for wound.  Neurological: Positive for weakness. Negative for numbness.      Allergies  Aspirin; Codeine; Compazine; Ioxaglate; Ivp dye; Ketorolac tromethamine; Metrizamide; Morphine and related; Naproxen; Norco; Penicillins; Prochlorperazine maleate; Reglan; Tegretol; Toradol; and Tramadol  Home Medications   Prior to Admission medications   Medication Sig Start Date End Date Taking? Authorizing Provider  alprazolam Prudy Feeler) 2 MG tablet Take 2 mg by mouth 2 (two) times daily as needed for anxiety (anxiety).     Historical Provider, MD  Aspirin-Salicylamide-Caffeine (BC HEADACHE POWDER PO) Take 1 packet by mouth daily as needed (headache).    Historical Provider, MD  ciprofloxacin (CIPRO) 500 MG tablet Take 1 tablet (500 mg total) by mouth 2 (two) times daily. Patient not taking: Reported on 03/02/2015 02/25/15   [name], PA-C  FLUoxetine (PROZAC) 40 MG capsule Take 40 mg by mouth every morning.     Historical Provider, MD  oxyCODONE (ROXICODONE) 5 MG immediate release tablet Take 1-2 tablets (5-10 mg total) by mouth every 4 (four) hours as needed for severe pain. Patient not taking: Reported on 02/24/2015 01/30/15  Bridgett Larsson, MD  phenazopyridine (PYRIDIUM) 200 MG tablet Take 1 tablet (200 mg total) by mouth 3 (three) times daily. Patient not taking: Reported on 03/02/2015 02/25/15   [name], PA-C  promethazine (PHENERGAN) 25 MG tablet Take 25 mg by mouth every 6 (six) hours as needed for nausea (nausea).     Historical Provider, MD  zolpidem (AMBIEN) 10 MG tablet Take 10 mg by mouth at bedtime.     Historical Provider, MD   BP 124/78 mmHg  Pulse 82  Temp(Src) 98.3 F (36.8 C)  Resp 22  SpO2 99% Physical  Exam  Constitutional: She is oriented to person, place, and time. She appears well-developed.  HENT:  Mucous membranes are dry  Eyes: EOM are normal.  Cardiovascular: Normal rate.   Pulmonary/Chest: Effort normal. No respiratory distress.  Abdominal: There is tenderness.  Upper abdominal tenderness without rebound or guarding.  Musculoskeletal:  Good grip strength bilaterally. Some pain with straight leg raise on right.  Neurological: She is alert and oriented to person, place, and time.  Sensation intact over both lower extremities. Patient able to cannulate out of the ER. Good flexion-extension and ankles.  Skin: Skin is warm.    ED Course  Procedures (including critical care time) Labs Review Labs Reviewed  CBC WITH DIFFERENTIAL/PLATELET  COMPREHENSIVE METABOLIC PANEL  URINALYSIS, ROUTINE W REFLEX MICROSCOPIC (NOT AT [facility])  I-STAT TROPOININ, ED  I-STAT CG4 LACTIC ACID, ED    Imaging Review No results found.   EKG Interpretation None      MDM   Final diagnoses:  Chronic pain  Drug-seeking behavior    Patient presents complaining of "everything". Complaining of abdominal pain and weakness on the right side. She was dishonest with her medical history. Stated that she had not seen a doctor since her visit to the ER 8 days ago however she has had at least 4 visits to ER since then. She had a prescription written for his pain medicine-[facility] and has had visits to [facility] twice in [place] once. She has also been apparently kicked out of her pain clinic for using her medications early not bring a pill counts and having negative drug screens. Negative urine culture here 8 days ago however is on Cipro from some of the other hospitals. At this point there is a large suspicion for drug-seeking behavior with the physician shopping and particularly the dishonesty about her visits. Patient was discharged    Benjiman Core, MD 03/04/15 1426

## 2015-03-06 LAB — I-STAT TROPONIN, ED: Troponin i, poc: 0.01 ng/mL (ref 0.00–0.08)

## 2015-03-06 LAB — I-STAT CG4 LACTIC ACID, ED: LACTIC ACID, VENOUS: 1.73 mmol/L (ref 0.5–2.0)

## 2015-06-08 DIAGNOSIS — G500 Trigeminal neuralgia: Secondary | ICD-10-CM | POA: Insufficient documentation

## 2015-07-27 IMAGING — CT CT ABD-PELV W/O CM
1 series · 15 of 28 positions shown, 19 images · non-contrast
Comparison: 12/25/2011

CLINICAL DATA: Left-sided abdominal pain for 5 days. History of
kidney stones.

EXAM:
CT ABDOMEN AND PELVIS WITHOUT CONTRAST
TECHNIQUE: Multidetector CT imaging of the abdomen and pelvis was performed
following the standard protocol without intravenous contrast.

[Series 4: lung · axial · 0.74mm/px · z∈[-195,-75]mm · 15 of 28 slices shown, 19 images]
[im 3/28  soft-tissue]
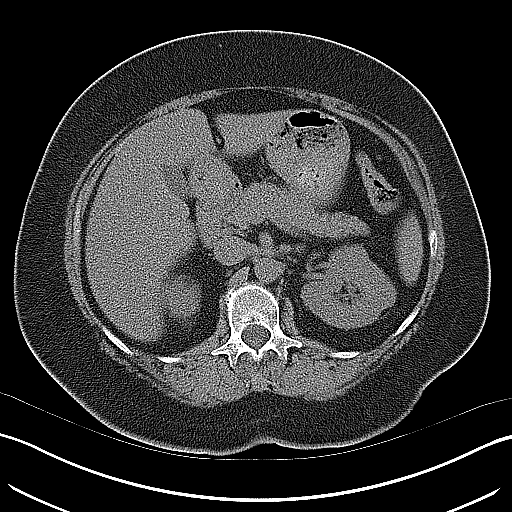
[im 3/28  bone]
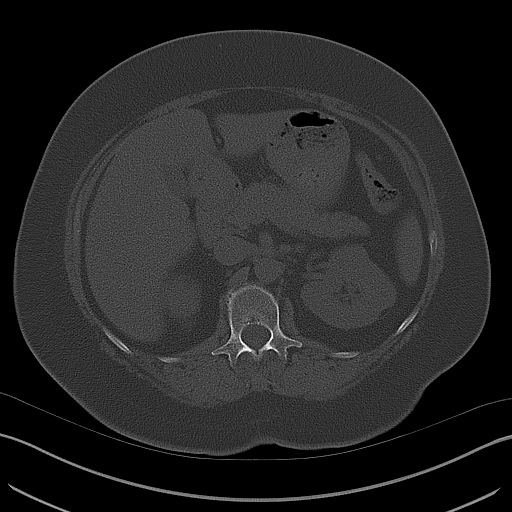
[im 5/28  soft-tissue]
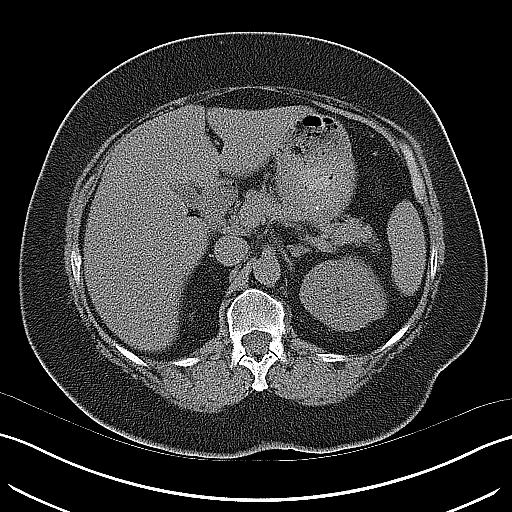
[im 7/28  soft-tissue]
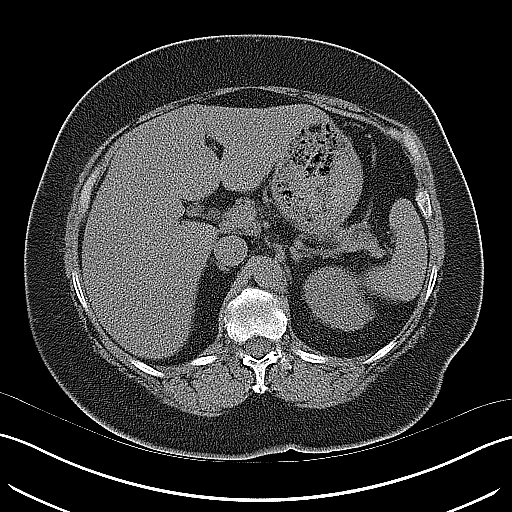
[im 9/28  soft-tissue]
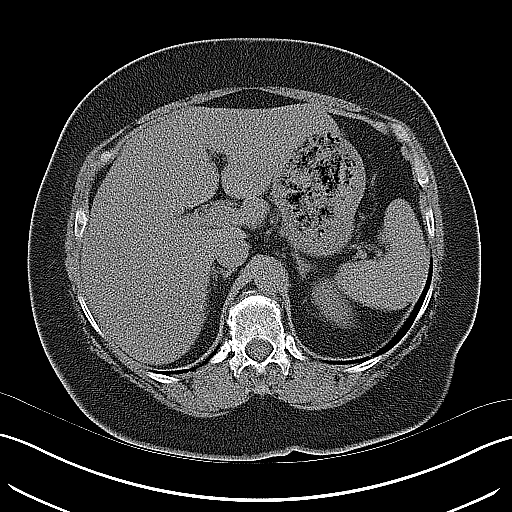
[im 11/28  soft-tissue]
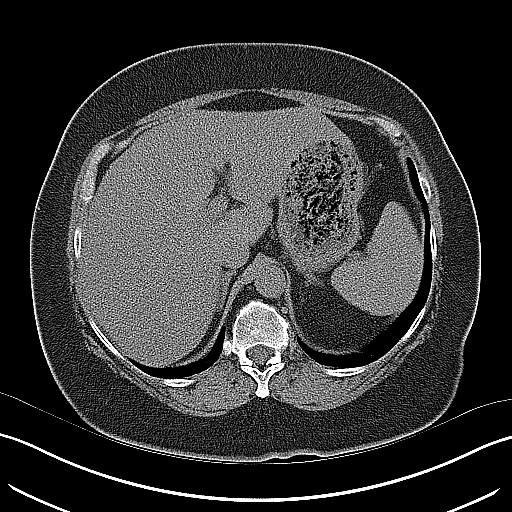
[im 13/28  soft-tissue]
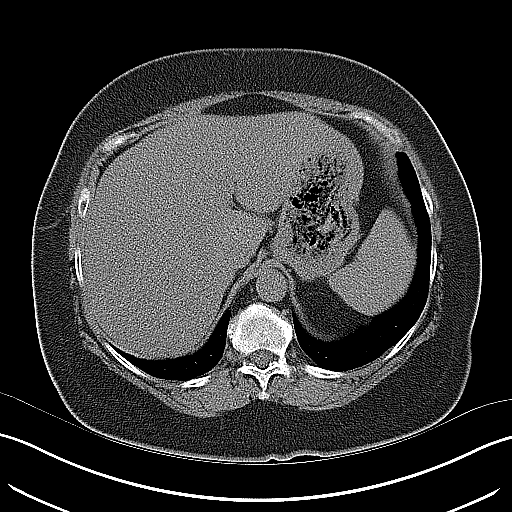
[im 15/28  soft-tissue]
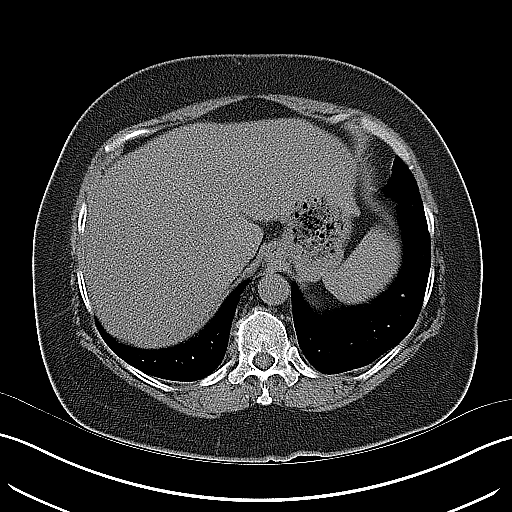
[im 17/28  soft-tissue]
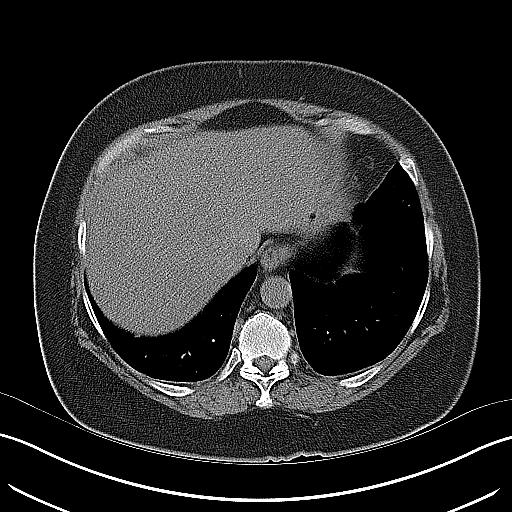
[im 19/28  soft-tissue]
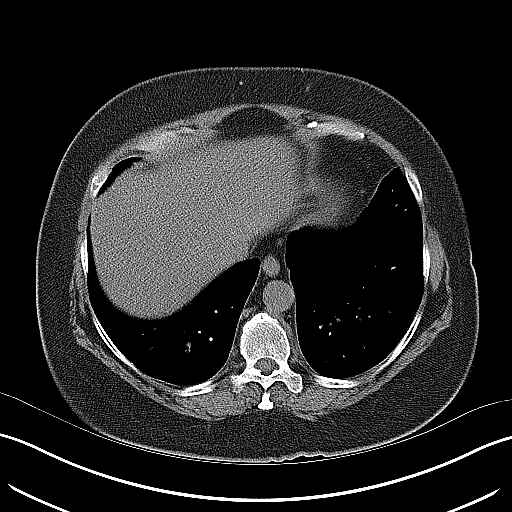
[im 19/28  bone]
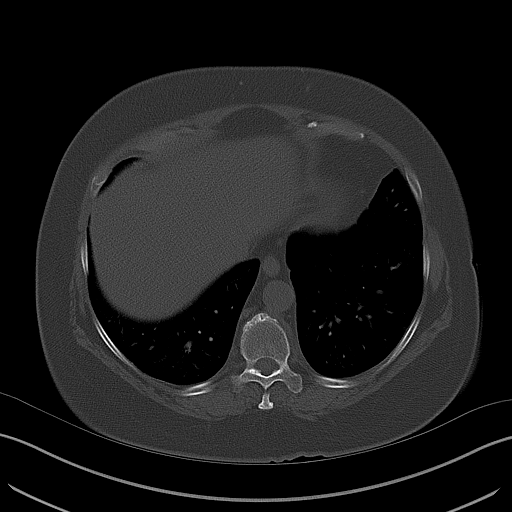
[im 21/28  soft-tissue]
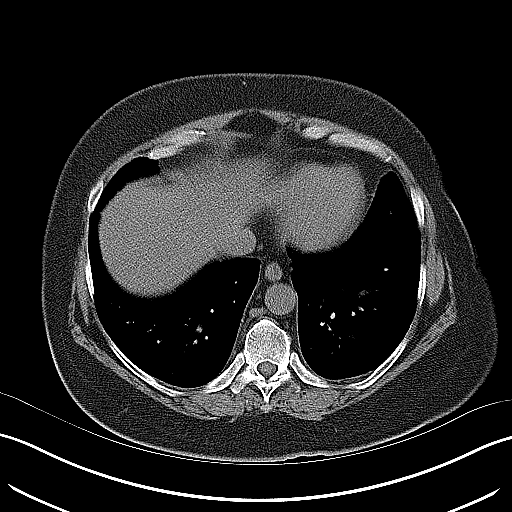
[im 23/28  soft-tissue]
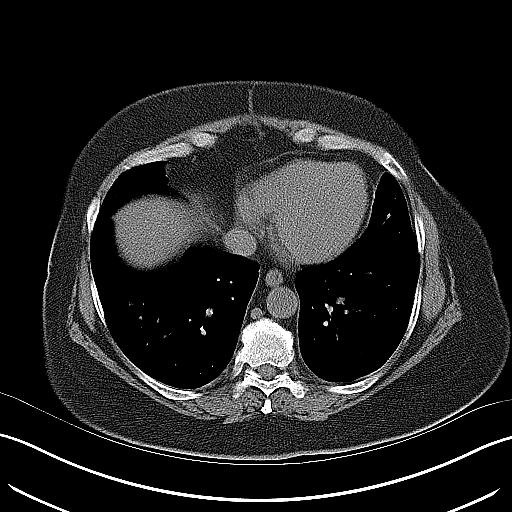
[im 24/28  lung]
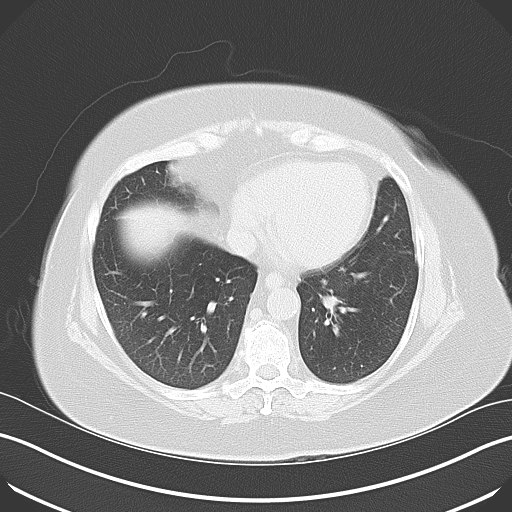
[im 25/28  soft-tissue]
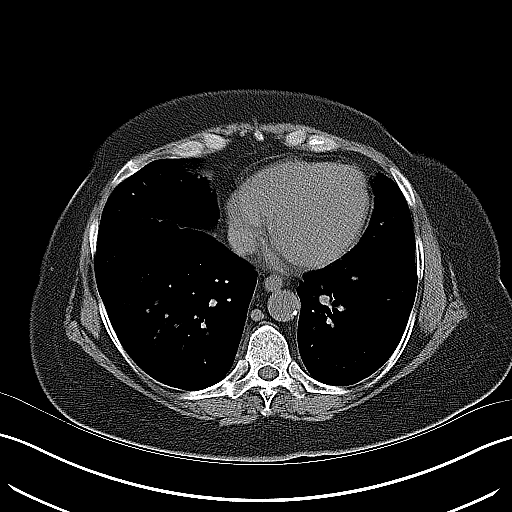
[im 25/28  lung]
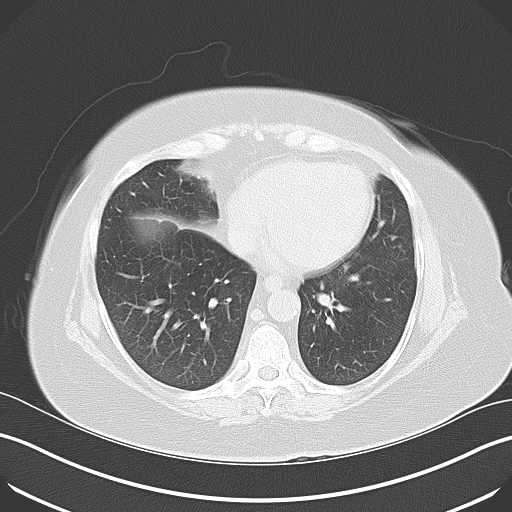
[im 26/28  lung]
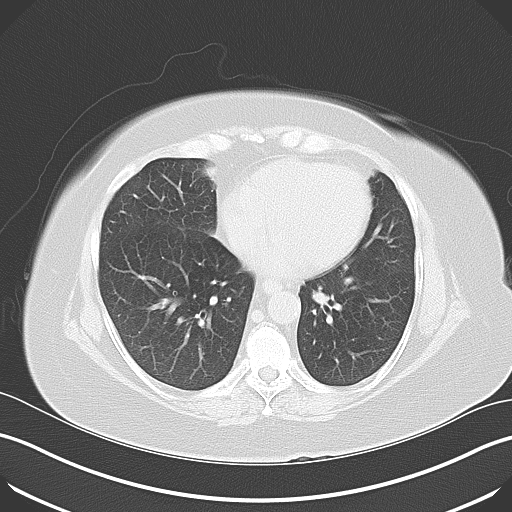
[im 27/28  soft-tissue]
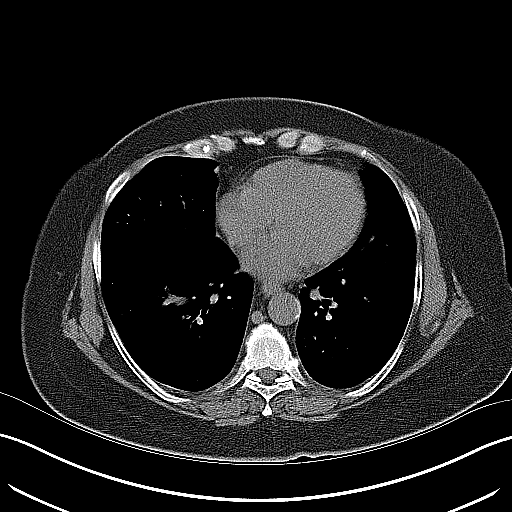
[im 27/28  lung]
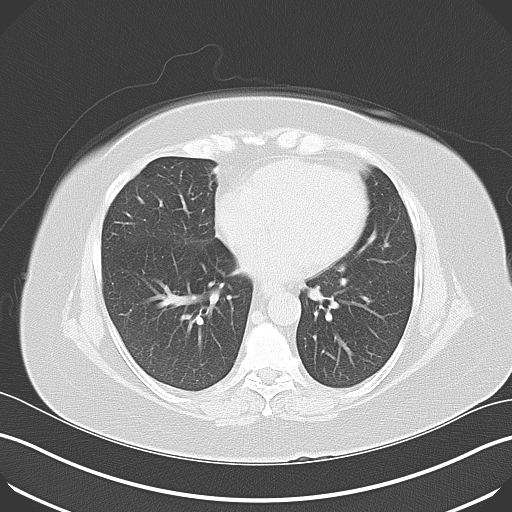

[15 of 28 positions shown; findings below may reference images not displayed]

FINDINGS: No ureteral stones or signs of obstruction.

There is right renal scarring with parenchymal calcifications. Small
nonobstructing intrarenal stones are noted bilaterally. Normal
ureters. Normal bladder.

The uterus is surgically absent. There are no pelvic masses.

Lung bases are essentially clear. The heart is normal in size.

Fatty infiltration of the liver. The liver is otherwise
unremarkable.

Normal spleen, gallbladder and pancreas. No bile duct dilation.
There are no adrenal masses. No adenopathy. No abnormal fluid
collections.

There are sigmoid colon diverticula. No diverticulitis. The colon is
otherwise unremarkable. Normal small bowel. Normal appendix.

There are degenerative changes most evident at L5-S1. No
osteoblastic or osteolytic lesions.
IMPRESSION: 1. No acute findings. No ureteral stone or sign of obstruction.
There are no findings to explain left-sided pain.
2. Right renal scarring. Right renal parenchymal calcifications.
Small bilateral nonobstructing intrarenal stones.
3. Sigmoid colon diverticula. No diverticulitis.
4. Status post hysterectomy. Degenerative changes most evident at
L5-S1. No other abnormalities.

## 2015-08-30 IMAGING — CR DG WRIST COMPLETE 3+V*L*
4 series · 4 of 4 positions shown · non-contrast
Comparison: None

CLINICAL DATA: Fell catching self with left hand, left hand and
wrist pain

EXAM:
LEFT WRIST - COMPLETE 3+ VIEW

[x wrist pa left]
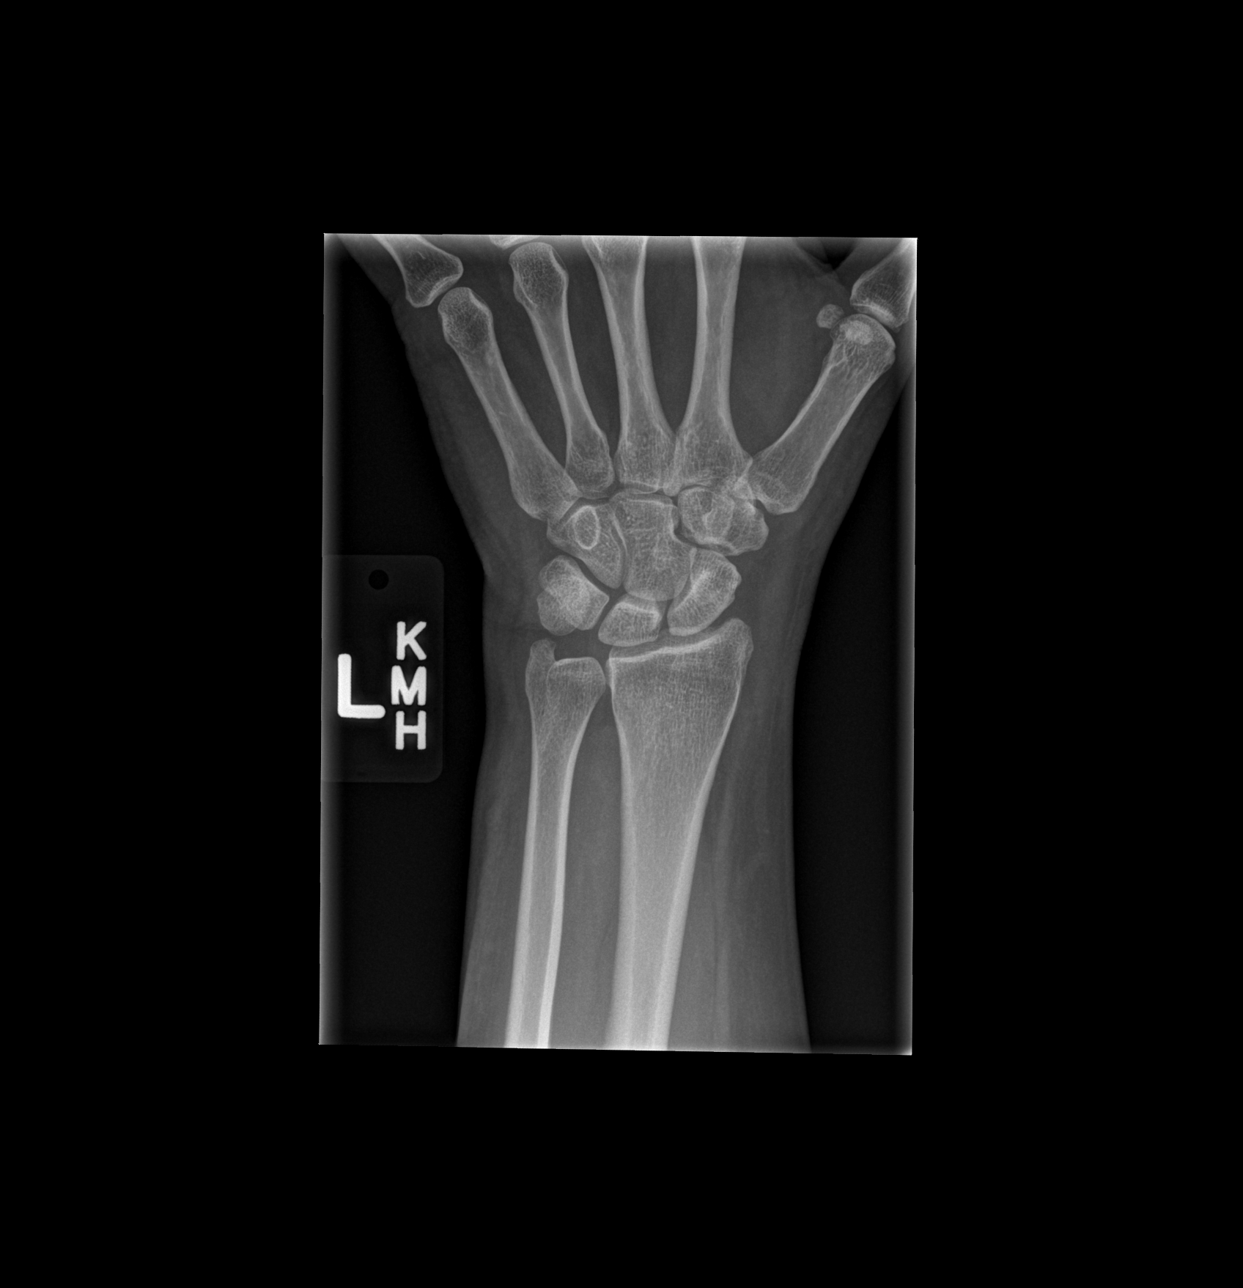

[x wrist obl left]
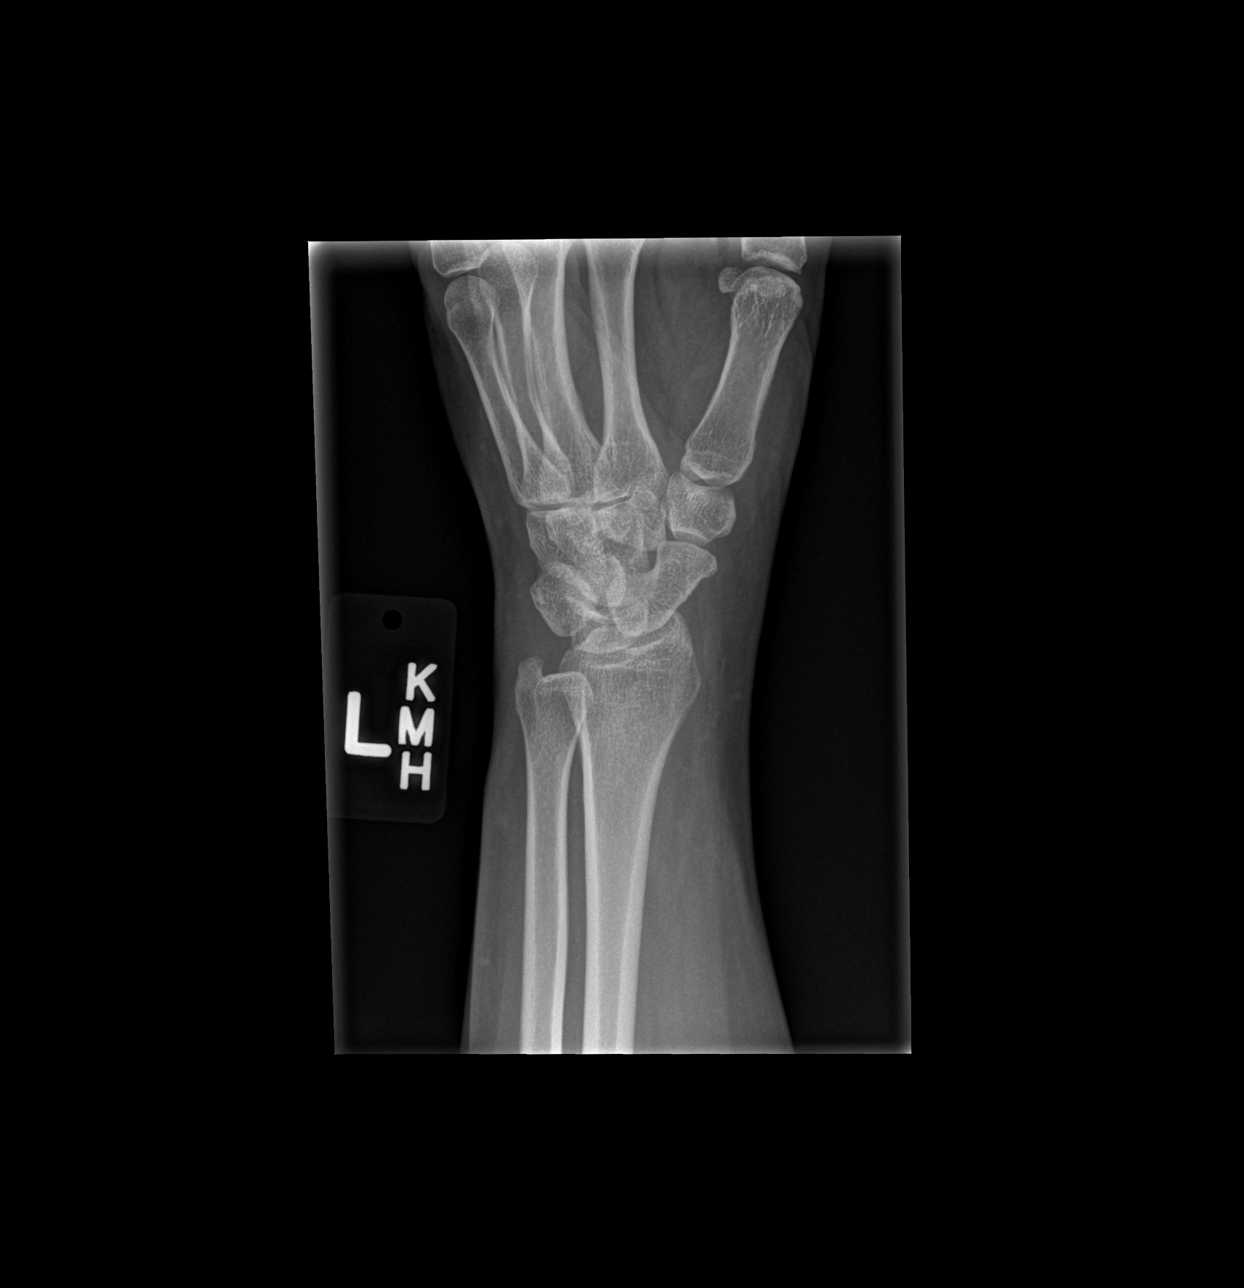

[x wrist lat left]
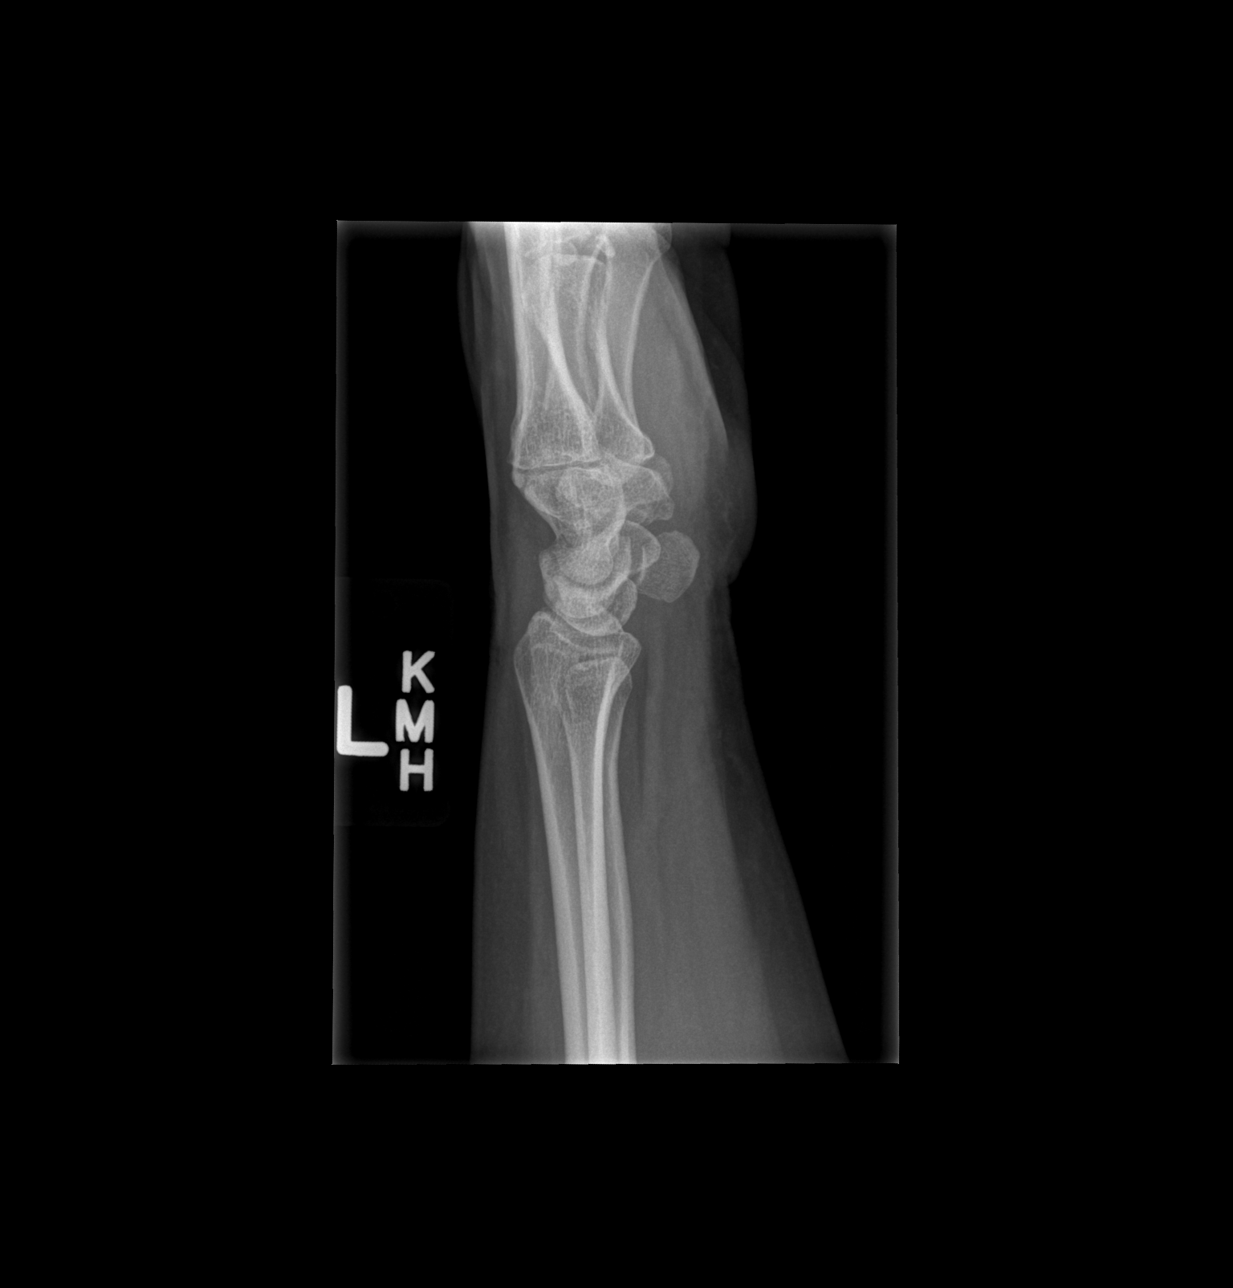

[x wrist navicular view left]
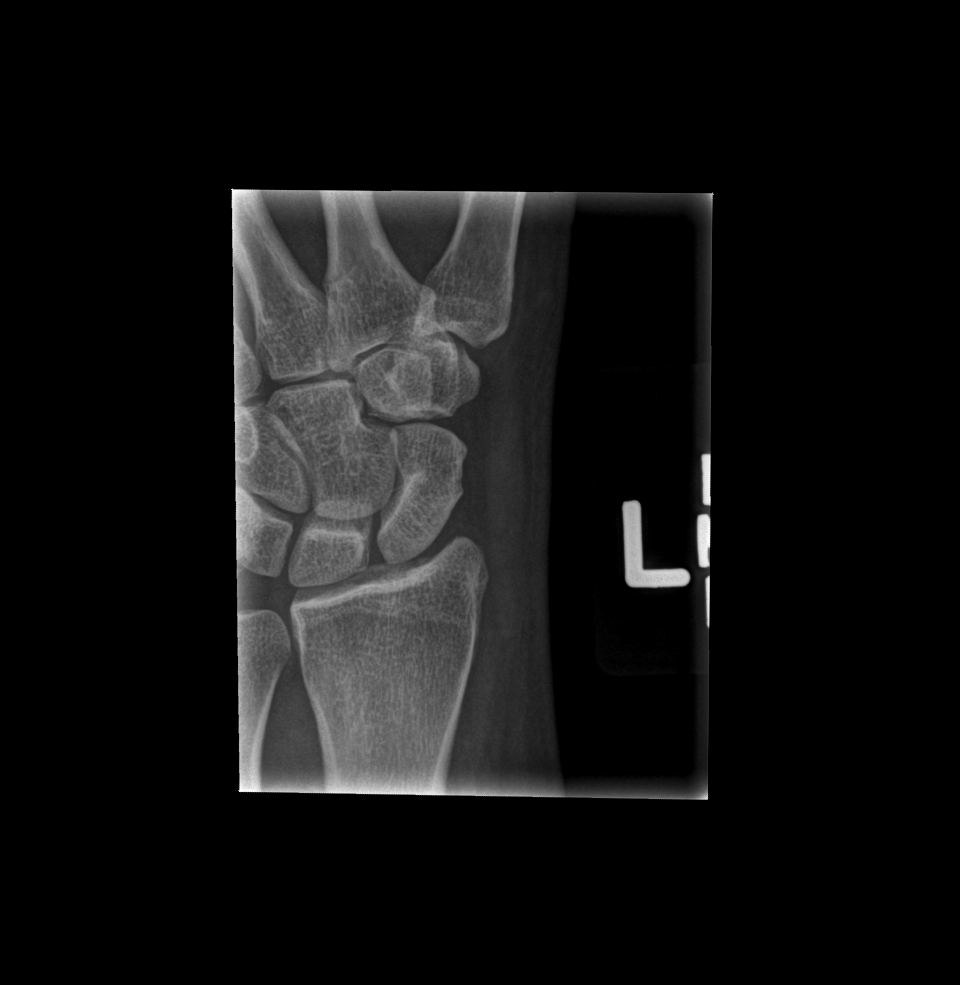

[4 of 4 positions shown; findings below may reference images not displayed]

FINDINGS: Osseous mineralization grossly normal for technique.

Joint spaces preserved.

No acute fracture, dislocation or bone destruction.
IMPRESSION: No acute osseous abnormalities.

## 2015-08-30 IMAGING — CR DG HAND COMPLETE 3+V*L*
3 series · 3 of 3 positions shown · non-contrast
Comparison: None

CLINICAL DATA: Fell catching self with left hand, left wrist and
hand pain

EXAM:
LEFT HAND - COMPLETE 3+ VIEW

[x hand pa left]
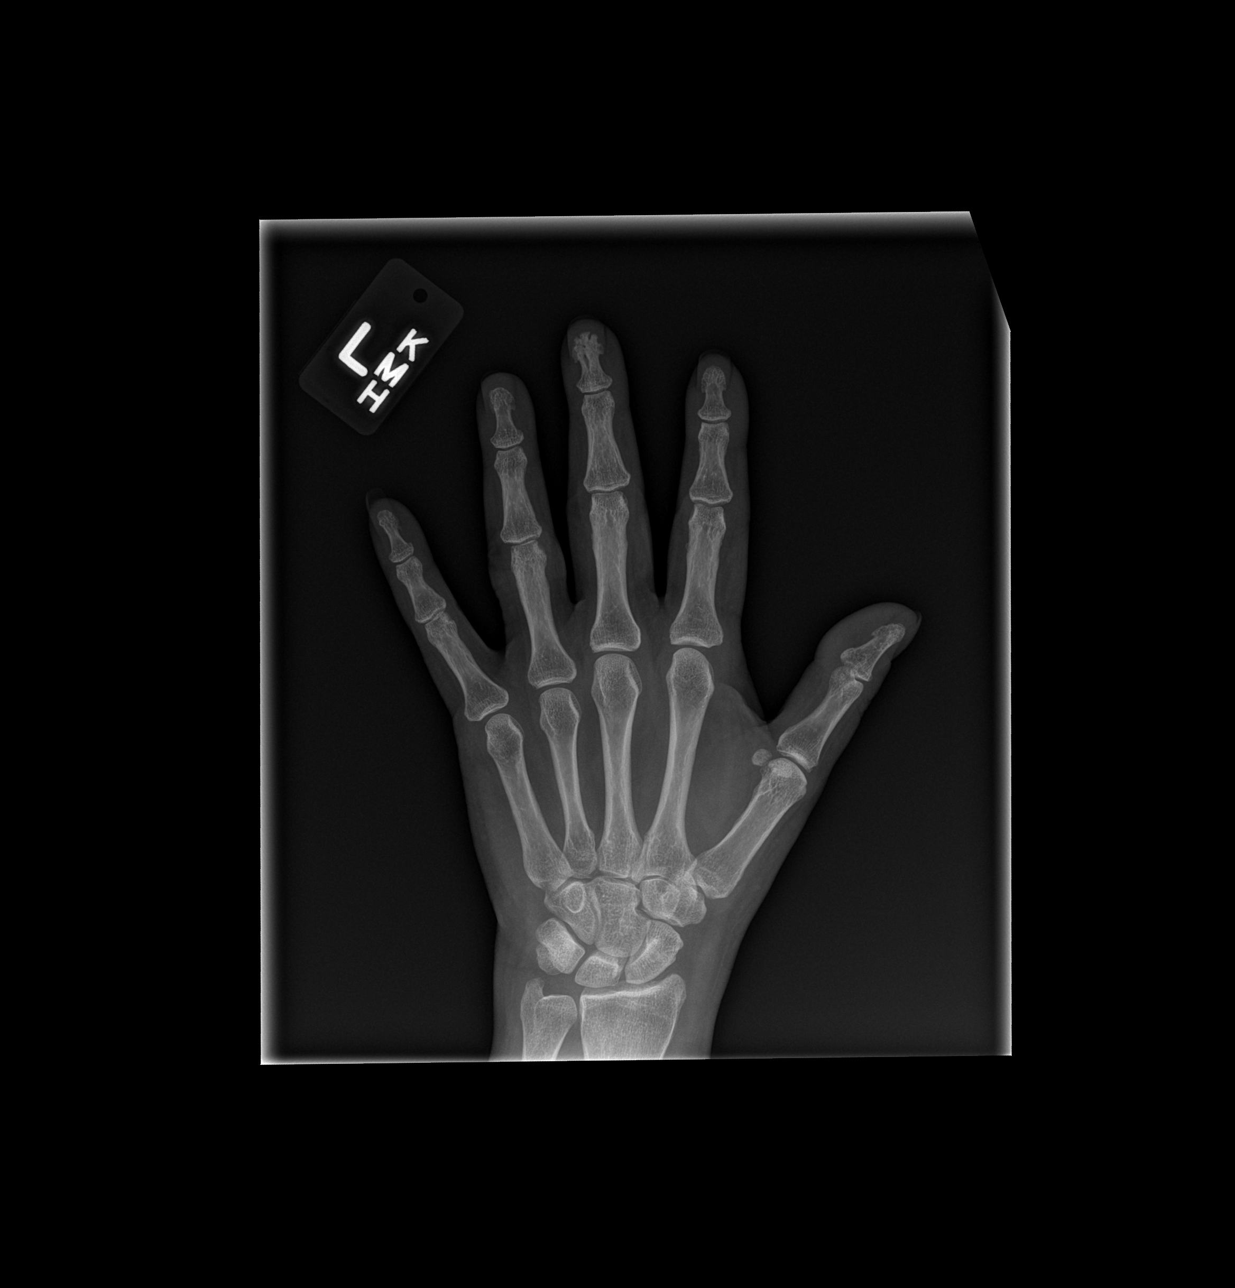

[x hand obl left]
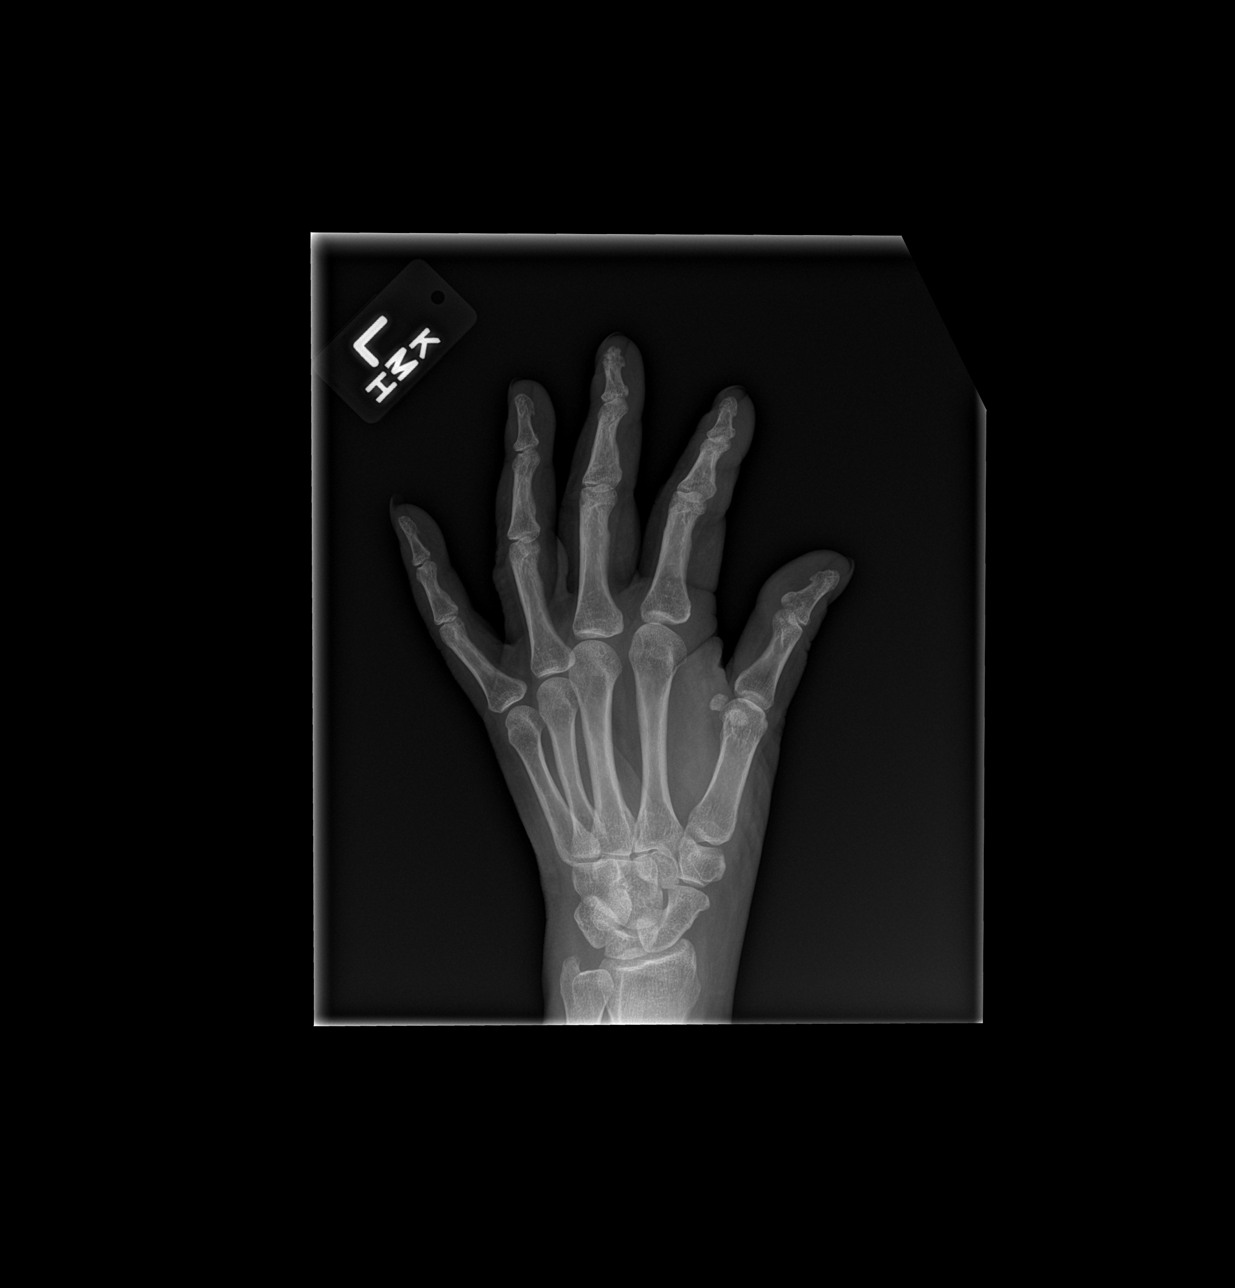

[x hand lat left]
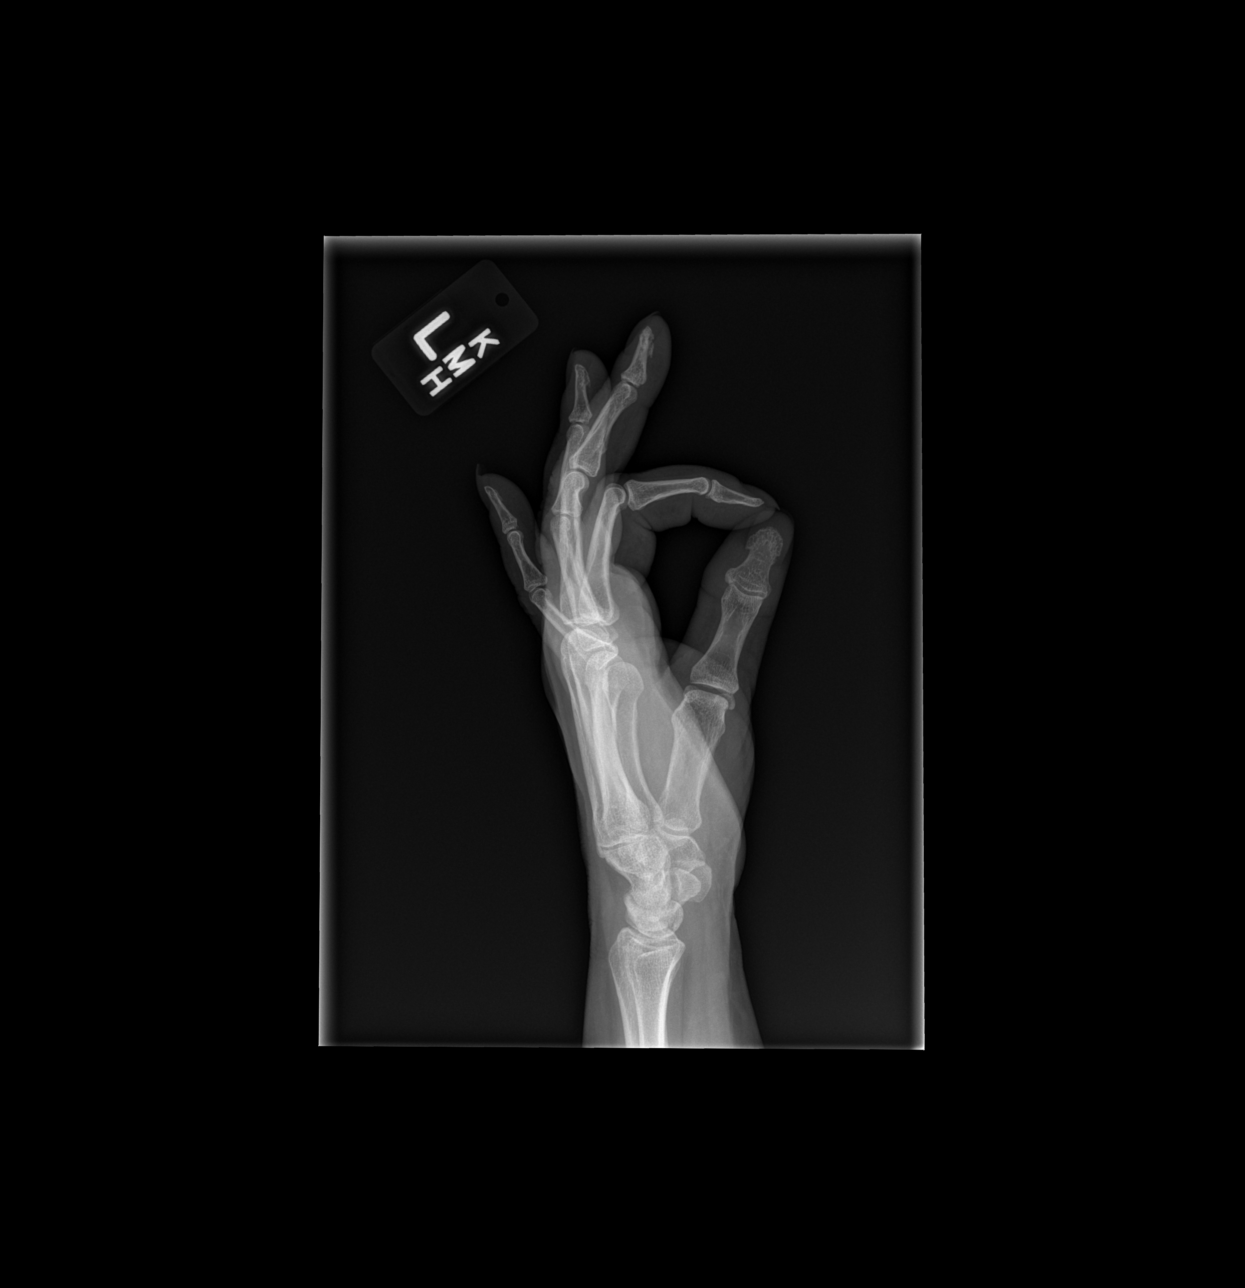

[3 of 3 positions shown; findings below may reference images not displayed]

FINDINGS: Osseous mineralization grossly normal for technique.

Joint spaces preserved.

No acute fracture, dislocation, or bone destruction.
IMPRESSION: No acute abnormalities.

## 2015-09-10 DIAGNOSIS — I639 Cerebral infarction, unspecified: Secondary | ICD-10-CM

## 2015-09-10 HISTORY — PX: OTHER SURGICAL HISTORY: SHX169

## 2015-09-10 HISTORY — DX: Cerebral infarction, unspecified: I63.9

## 2015-09-14 ENCOUNTER — Emergency Department (HOSPITAL_COMMUNITY)
Admission: EM | Admit: 2015-09-14 | Discharge: 2015-09-16 | Disposition: A | Discharge status: Home / Self Care | Payer: Self-pay | Attending: Emergency Medicine | Admitting: Emergency Medicine

## 2015-09-14 ENCOUNTER — Encounter (HOSPITAL_COMMUNITY): Payer: Self-pay | Admitting: Emergency Medicine

## 2015-09-14 DIAGNOSIS — N390 Urinary tract infection, site not specified: Secondary | ICD-10-CM | POA: Insufficient documentation

## 2015-09-14 DIAGNOSIS — Z792 Long term (current) use of antibiotics: Secondary | ICD-10-CM | POA: Insufficient documentation

## 2015-09-14 DIAGNOSIS — Z87442 Personal history of urinary calculi: Secondary | ICD-10-CM | POA: Insufficient documentation

## 2015-09-14 DIAGNOSIS — I10 Essential (primary) hypertension: Secondary | ICD-10-CM | POA: Insufficient documentation

## 2015-09-14 DIAGNOSIS — Z79899 Other long term (current) drug therapy: Secondary | ICD-10-CM | POA: Insufficient documentation

## 2015-09-14 DIAGNOSIS — R197 Diarrhea, unspecified: Secondary | ICD-10-CM | POA: Insufficient documentation

## 2015-09-14 DIAGNOSIS — S29002A Unspecified injury of muscle and tendon of back wall of thorax, initial encounter: Secondary | ICD-10-CM | POA: Insufficient documentation

## 2015-09-14 DIAGNOSIS — Y9389 Activity, other specified: Secondary | ICD-10-CM | POA: Insufficient documentation

## 2015-09-14 DIAGNOSIS — Z8619 Personal history of other infectious and parasitic diseases: Secondary | ICD-10-CM | POA: Insufficient documentation

## 2015-09-14 DIAGNOSIS — W19XXXA Unspecified fall, initial encounter: Secondary | ICD-10-CM

## 2015-09-14 DIAGNOSIS — Z8739 Personal history of other diseases of the musculoskeletal system and connective tissue: Secondary | ICD-10-CM | POA: Insufficient documentation

## 2015-09-14 DIAGNOSIS — Y998 Other external cause status: Secondary | ICD-10-CM | POA: Insufficient documentation

## 2015-09-14 DIAGNOSIS — F17210 Nicotine dependence, cigarettes, uncomplicated: Secondary | ICD-10-CM | POA: Insufficient documentation

## 2015-09-14 DIAGNOSIS — W01198A Fall on same level from slipping, tripping and stumbling with subsequent striking against other object, initial encounter: Secondary | ICD-10-CM | POA: Insufficient documentation

## 2015-09-14 DIAGNOSIS — Y9289 Other specified places as the place of occurrence of the external cause: Secondary | ICD-10-CM | POA: Insufficient documentation

## 2015-09-14 DIAGNOSIS — M546 Pain in thoracic spine: Secondary | ICD-10-CM

## 2015-09-14 DIAGNOSIS — Z8719 Personal history of other diseases of the digestive system: Secondary | ICD-10-CM | POA: Insufficient documentation

## 2015-09-14 DIAGNOSIS — W541XXA Struck by dog, initial encounter: Secondary | ICD-10-CM | POA: Insufficient documentation

## 2015-09-14 DIAGNOSIS — Z880 Allergy status to penicillin: Secondary | ICD-10-CM | POA: Insufficient documentation

## 2015-09-14 LAB — CBC WITH DIFFERENTIAL/PLATELET
BASOS ABS: 0 10*3/uL (ref 0.0–0.1)
BASOS PCT: 0 %
EOS ABS: 0.1 10*3/uL (ref 0.0–0.7)
EOS PCT: 2 %
HCT: 45.8 % (ref 36.0–46.0)
Hemoglobin: 14.8 g/dL (ref 12.0–15.0)
Lymphocytes Relative: 45 %
Lymphs Abs: 3.2 10*3/uL (ref 0.7–4.0)
MCH: 31 pg (ref 26.0–34.0)
MCHC: 32.3 g/dL (ref 30.0–36.0)
MCV: 96 fL (ref 78.0–100.0)
MONO ABS: 0.7 10*3/uL (ref 0.1–1.0)
MONOS PCT: 10 %
Neutro Abs: 3 10*3/uL (ref 1.7–7.7)
Neutrophils Relative %: 43 %
PLATELETS: 176 10*3/uL (ref 150–400)
RBC: 4.77 MIL/uL (ref 3.87–5.11)
RDW: 13.4 % (ref 11.5–15.5)
WBC: 7.1 10*3/uL (ref 4.0–10.5)

## 2015-09-14 LAB — URINALYSIS, ROUTINE W REFLEX MICROSCOPIC
BILIRUBIN URINE: NEGATIVE
GLUCOSE, UA: NEGATIVE mg/dL
Ketones, ur: NEGATIVE mg/dL
Nitrite: NEGATIVE
PH: 6.5 (ref 5.0–8.0)
Protein, ur: NEGATIVE mg/dL
SPECIFIC GRAVITY, URINE: 1.014 (ref 1.005–1.030)

## 2015-09-14 LAB — LIPASE, BLOOD: Lipase: 21 U/L (ref 11–51)

## 2015-09-14 LAB — COMPREHENSIVE METABOLIC PANEL
ALK PHOS: 111 U/L (ref 38–126)
ALT: 25 U/L (ref 14–54)
AST: 23 U/L (ref 15–41)
Albumin: 4.2 g/dL (ref 3.5–5.0)
Anion gap: 13 (ref 5–15)
BILIRUBIN TOTAL: 0.5 mg/dL (ref 0.3–1.2)
BUN: 16 mg/dL (ref 6–20)
CALCIUM: 10 mg/dL (ref 8.9–10.3)
CO2: 29 mmol/L (ref 22–32)
Chloride: 98 mmol/L — ABNORMAL LOW (ref 101–111)
Creatinine, Ser: 0.74 mg/dL (ref 0.44–1.00)
GFR calc Af Amer: >60 mL/min (ref >60)
GFR calc non Af Amer: >60 mL/min (ref >60)
GLUCOSE: 116 mg/dL — AB (ref 65–99)
Potassium: 4.1 mmol/L (ref 3.5–5.1)
Sodium: 140 mmol/L (ref 135–145)
TOTAL PROTEIN: 7.8 g/dL (ref 6.5–8.1)

## 2015-09-14 LAB — URINE MICROSCOPIC-ADD ON

## 2015-09-14 MED ORDER — ACETAMINOPHEN 325 MG PO TABS
650.0000 mg | ORAL_TABLET | Freq: Once | ORAL | Status: AC
  Administered 2015-09-14: 650 mg via ORAL
  Filled 2015-09-14: qty 2

## 2015-09-14 MED ORDER — SODIUM CHLORIDE 0.9 % IV BOLUS (SEPSIS): 1000.0000 mL | Freq: Once | INTRAVENOUS | Status: DC

## 2015-09-14 MED ORDER — ONDANSETRON 4 MG PO TBDP
4.0000 mg | ORAL_TABLET | Freq: Once | ORAL | Status: AC
  Administered 2015-09-14: 4 mg via ORAL
  Filled 2015-09-14: qty 1

## 2015-09-14 MED ORDER — KETOROLAC TROMETHAMINE 30 MG/ML IJ SOLN
30.0000 mg | Freq: Once | INTRAMUSCULAR | Status: AC
  Administered 2015-09-14: 30 mg via INTRAVENOUS
  Filled 2015-09-14: qty 1

## 2015-09-14 MED ORDER — SULFAMETHOXAZOLE-TRIMETHOPRIM 800-160 MG PO TABS: 1.0000 | ORAL_TABLET | Freq: Two times a day (BID) | ORAL | Status: AC

## 2015-09-14 MED ORDER — DIPHENHYDRAMINE HCL 50 MG/ML IJ SOLN
25.0000 mg | Freq: Once | INTRAMUSCULAR | Status: AC
  Administered 2015-09-14: 25 mg via INTRAVENOUS
  Filled 2015-09-14: qty 1

## 2015-09-14 MED ORDER — ONDANSETRON HCL 4 MG/2ML IJ SOLN
4.0000 mg | Freq: Once | INTRAMUSCULAR | Status: AC
  Administered 2015-09-14: 4 mg via INTRAVENOUS
  Filled 2015-09-14: qty 2

## 2015-09-14 MED ORDER — SODIUM CHLORIDE 0.9 % IV BOLUS (SEPSIS)
1000.0000 mL | Freq: Once | INTRAVENOUS | Status: AC
  Administered 2015-09-14: 1000 mL via INTRAVENOUS

## 2015-09-14 NOTE — ED Notes (Signed)
Per pt, states she fell today and is now having lower back pain-states she has a history of kidney stones but did not have pain until she fell

## 2015-09-14 NOTE — ED Provider Notes (Signed)
MSE was initiated and I personally evaluated the patient and placed orders (if any) at  2:45 PM on September 14, 2015.    [name] is a 50 y.o. female, with a PMhx of HTN, sciatica, nephrolithiasis, and drug seeking behavior, who presents to the Emergency Department complaining of sudden onset, constant, moderate left sided thoracic and lumbar back pain onset 10 hours ago s/p mechanical fall. Pt reports she tripped over her dog and fell, hitting her back on the corner of the coffee table and hitting her head. Denies LOC. Pt also reports paraesthesia to anterior left upper leg s/p fall. No overlying skin changes.   Pt is also c/o left flank pain that radiates to LLQ with associated nausea, emesis and diarrhea X 2-3 days. She reports 2 episodes of emesis and 3 episodes of diarrhea today. Pt denies CP, SOB, numbness or weakness, bowel or bladder incontinence, any urinary symptoms, hematemesis or blood in stool.  On exam the patient is afebrile nontoxic appearing. She is retching into an emesis basin with no vomiting. I had not seen her vomit. This patient does not appear fast-track program. Per nursing staff patient should be moved to acute care side and agree with this plan. Orders were initiated and patient will go back to the waiting room.  The patient appears stable so that the remainder of the MSE may be completed by another provider.  Everlene FarrierWilliam Dearia Wilmouth, PA-C 09/14/15 1955  Azalia BilisKevin Campos, MD 09/15/15 2017

## 2015-09-14 NOTE — ED Provider Notes (Signed)
CSN: [id number]     Arrival date & time 09/14/15  1355 History   First MD Initiated Contact with Patient 09/14/15 1424     Chief Complaint  Patient presents with  . Fall   HPI  [name] is a 50 year old female with PMHx of HTN, hepatitis C, sciatica, kidney stones and drug seeking behavior presenting after a fall. She reports tripping over her dog earlier this morning. She fell and hit the left side of her back on her coffee table. She also endorses hitting her head on the table but denies loss of consciousness. She is reporting constant left-sided thoracic back pain since the fall. The pain is exacerbated by palpation and movement. She describes it as a sharp pain. She has not tried any home pain medications for relief. The pain does not radiate. She also states the anterior portion of her left thigh "feels funny". She describes it as pins and needles. She is also reporting 3-4 days of nausea, vomiting and diarrhea. She denies abdominal pain with the symptoms. She denies blood in her vomit or stool. She states that this is a new problem and she has not taken medications for it. She is also complaining of increased urinary frequency. She does note that she has frequent UTIs. Denies fevers, chills, headaches, syncope, chest pain, shortness of breath, cough, incontinence of bowel or bladder, dysuria, vaginal discharge, hematuria, numbness of the lower extremities or weakness of the lower extremities.  Patient was initially seen in fast track situation but was moved to acute care after retching into an emesis bag.  Past Medical History  Diagnosis Date  . Hypertension   . Hepatitis C   . Sciatica   . Kidney stones   . Diverticulitis    Past Surgical History  Procedure Laterality Date  . Abdominal hysterectomy    . Sinus exploration    . Lithotripsy    . Kidney stone surgery Right 02/12/12  . Kidney stone surgery Left 07/15/2012  . Oophorectomy     Family History  Problem Relation Age of  Onset  . Heart failure Mother   . Diabetes Father   . Cancer Sister    Social History  Substance Use Topics  . Smoking status: Current Every Day Smoker -- 0.25 packs/day for 1.5 years    Types: Cigarettes  . Smokeless tobacco: Never Used  . Alcohol Use: No   OB History    No data available     Review of Systems  Constitutional: Negative for fever and chills.  HENT: Negative for congestion, rhinorrhea and sore throat.   Eyes: Negative for visual disturbance.  Respiratory: Negative for shortness of breath.   Cardiovascular: Negative for chest pain.  Gastrointestinal: Positive for nausea, vomiting and diarrhea. Negative for abdominal pain.  Genitourinary: Positive for frequency. Negative for dysuria, hematuria, flank pain and vaginal discharge.  Musculoskeletal: Positive for back pain. Negative for myalgias, arthralgias and neck pain.  Skin: Negative for rash.  Neurological: Negative for syncope, weakness, numbness and headaches.  All other systems reviewed and are negative.     Allergies  Aspirin; Codeine; Compazine; Ioxaglate; Ivp dye; Ketorolac tromethamine; Metrizamide; Morphine and related; Naproxen; Norco; Penicillins; Prochlorperazine maleate; Reglan; Tegretol; Toradol; and Tramadol  Home Medications   Prior to Admission medications   Medication Sig Start Date End Date Taking? Authorizing Provider  alprazolam Prudy Feeler) 2 MG tablet Take 2 mg by mouth 2 (two) times daily as needed for anxiety (anxiety).    Yes Historical  Provider, MD  Aspirin-Salicylamide-Caffeine (BC HEADACHE POWDER PO) Take 1 packet by mouth daily as needed (headache).   Yes Historical Provider, MD  cyclobenzaprine (FLEXERIL) 5 MG tablet Take 7.5 mg by mouth 3 (three) times daily as needed for muscle spasms.  09/07/15  Yes Historical Provider, MD  dicyclomine (BENTYL) 20 MG tablet Take 20 mg by mouth 4 (four) times daily.  07/26/15 07/25/16 Yes Historical Provider, MD  FLUoxetine (PROZAC) 40 MG capsule  Take 40 mg by mouth every morning.    Yes Historical Provider, MD  hydrochlorothiazide (HYDRODIURIL) 50 MG tablet Take 50 mg by mouth daily.  08/24/15  Yes Historical Provider, MD  losartan (COZAAR) 25 MG tablet Take 50 mg by mouth daily.  07/28/15 07/27/16 Yes Historical Provider, MD  omeprazole (PRILOSEC) 40 MG capsule Take 40 mg by mouth. 07/28/15  Yes Historical Provider, MD  pregabalin (LYRICA) 100 MG capsule Take 100 mg by mouth 3 (three) times daily.   Yes Historical Provider, MD  promethazine (PHENERGAN) 25 MG tablet Take 25 mg by mouth every 6 (six) hours as needed for nausea (nausea).    Yes Historical Provider, MD  zolpidem (AMBIEN) 10 MG tablet Take 10 mg by mouth at bedtime.    Yes Historical Provider, MD  ciprofloxacin (CIPRO) 500 MG tablet Take 1 tablet (500 mg total) by mouth 2 (two) times daily. Patient not taking: Reported on 03/02/2015 02/25/15   [name], PA-C  oxyCODONE (ROXICODONE) 5 MG immediate release tablet Take 1-2 tablets (5-10 mg total) by mouth every 4 (four) hours as needed for severe pain. Patient not taking: Reported on 02/24/2015 01/30/15   Bridgett Larsson, MD  phenazopyridine (PYRIDIUM) 200 MG tablet Take 1 tablet (200 mg total) by mouth 3 (three) times daily. Patient not taking: Reported on 03/02/2015 02/25/15   [name], PA-C  sulfamethoxazole-trimethoprim (BACTRIM DS,SEPTRA DS) 800-160 MG tablet Take 1 tablet by mouth 2 (two) times daily. 09/14/15 09/21/15  Keltin Baird, PA-C   BP 133/78 mmHg  Pulse 84  Temp(Src) 97.5 F (36.4 C) (Oral)  Resp 18  SpO2 96% Physical Exam  Constitutional: She is oriented to person, place, and time. She appears well-developed and well-nourished. No distress.  HENT:  Head: Normocephalic and atraumatic.  Mouth/Throat: Oropharynx is clear and moist. No oropharyngeal exudate.  Eyes: Conjunctivae and EOM are normal. Pupils are equal, round, and reactive to light. Right eye exhibits no discharge. Left eye exhibits no discharge. No  scleral icterus.  Neck: Normal range of motion. Neck supple.  Cardiovascular: Normal rate, regular rhythm and normal heart sounds.   Pulmonary/Chest: Effort normal and breath sounds normal. No respiratory distress. She has no wheezes. She has no rales.  Abdominal: Soft. There is no tenderness. There is no rebound and no guarding.  Musculoskeletal: Normal range of motion.       Thoracic back: She exhibits tenderness. She exhibits normal range of motion and no deformity.       Back:  Generalized tenderness over left lateral thoracic region as indicated in diagram. No tenderness over thoracic spine. No bony deformities or step offs. FROM of the thoracic and lumbar back intact.   Neurological: She is alert and oriented to person, place, and time. No cranial nerve deficit. Coordination normal.  Cranial nerves 3-12 intact. Major muscle groups with 5/5 motor strength. Sensation to light touch intact. Coordinated finger to nose. Walks with a steady gait.   Skin: Skin is warm and dry.  No erythema, ecchymosis, laceration, abrasion, rash or other  skin changes noted over left back or left die  Psychiatric: She has a normal mood and affect. Her behavior is normal.  Nursing note and vitals reviewed.   ED Course  Procedures (including critical care time) Labs Review Labs Reviewed  COMPREHENSIVE METABOLIC PANEL - Abnormal; Notable for the following:    Chloride 98 (*)    Glucose, Bld 116 (*)    All other components within normal limits  URINALYSIS, ROUTINE W REFLEX MICROSCOPIC (NOT AT [facility]) - Abnormal; Notable for the following:    APPearance CLOUDY (*)    Hgb urine dipstick TRACE (*)    Leukocytes, UA LARGE (*)    All other components within normal limits  URINE MICROSCOPIC-ADD ON - Abnormal; Notable for the following:    Squamous Epithelial / LPF 6-30 (*)    Bacteria, UA MANY (*)    All other components within normal limits  URINE CULTURE  LIPASE, BLOOD  CBC WITH DIFFERENTIAL/PLATELET     Imaging Review No results found. I have personally reviewed and evaluated these images and lab results as part of my medical decision-making.   EKG Interpretation None      MDM   Final diagnoses:  Fall, initial encounter  Left-sided thoracic back pain  UTI (lower urinary tract infection)   50 year old female presenting with left sided back pain after tripping over her dog and hitting a coffee table. She also notes 3-4 days of nausea, vomiting and diarrhea. Afebrile. Non-focal neuro exam. TTP over left thoracic back without bony deformity. Tenderness is located over the musculature of the back. Imaging is not indicated at this time. No loss of bowel or bladder control. No numbness or weakness in the lower extremities. Pt also given 1 L bolus and zofran for nausea. Pt was initially in fast track where she was noted to be retching into an emesis bag. No retching or vomiting in acute ED. Abdomen is soft, non-tender without peritoneal signs. Blood work unremarkable. UA positive for UTI. Chart review shows that pt has history of drug seeking behavior. She presented to outside ED 2 days ago for same complaint of back pain after tripping over her dog. She received #6 percocet at that visit. She frequently presents to local emergency departments with complaints of falls, back pain and NVD. She has left AMA multiple times when not give IV narcotic medications. She has hx of being dismissed from pain clinics for noncompliance with their rules. [facility] who prescribes her tramadol for chronic low back pain. She is also [facility]. When discussing pain options, she states "tylenol and motrin just don't work. Everything else makes me itch so I can only take morphine or fentanyl". Discussed that I did not feel comfortable providing narcotics for her MSK pain and offered toradol with benadryl to prevent itching which she  agreed to. She reports this did not improve her pain and is still requesting narcotic prescription for home. Discussed again that I would not provide narcotics because she has prescription for tramadol from her PCP. Recommended conservative therapy and follow up with her PCP.  Return precautions given in discharge paperwork and discussed with pt at bedside. Pt stable for discharge .       Stephanie HeimlichStevi Milbern Doescher, PA-C 09/15/15 0026  Stephanie HeimlichStevi Graeden Bitner, PA-C 09/15/15 0215  Lorre NickAnthony Allen, MD 09/20/15 [phone]

## 2015-09-14 NOTE — ED Notes (Signed)
Pt c/o that medical staff didn't meet her needs and refused to take the prescription given for her UTI, despite the [place] advising the pt that her antibiotics that she stated she had at home was old and outdated.  Pt was advised to call our customer compliant area and advise them of her displeasure with our service.  She also advised me that she was not paying for services not rendered to her satisfaction.

## 2015-09-14 NOTE — ED Notes (Signed)
Pt discovered to be more appropriate for level 3.  Pt protocol orders placed and transferred back to lobby.

## 2015-09-14 NOTE — Discharge Instructions (Signed)
Use OTC tylenol or ibuprofen for pain control. Heating pads and ice can be used for musculoskeletal pain. Keep your follow up appointments.    Back Pain, Adult Back pain is very common in adults.The cause of back pain is rarely dangerous and the pain often gets better over time.The cause of your back pain may not be known. Some common causes of back pain include:  Strain of the muscles or ligaments supporting the spine.  Wear and tear (degeneration) of the spinal disks.  Arthritis.  Direct injury to the back. For many people, back pain may return. Since back pain is rarely dangerous, most people can learn to manage this condition on their own. HOME CARE INSTRUCTIONS Watch your back pain for any changes. The following actions may help to lessen any discomfort you are feeling:  Remain active. It is stressful on your back to sit or stand in one place for long periods of time. Do not sit, drive, or stand in one place for more than 30 minutes at a time. Take short walks on even surfaces as soon as you are able.Try to increase the length of time you walk each day.  Exercise regularly as directed by your health care provider. Exercise helps your back heal faster. It also helps avoid future injury by keeping your muscles strong and flexible.  Do not stay in bed.Resting more than 1-2 days can delay your recovery.  Pay attention to your body when you bend and lift. The most comfortable positions are those that put less stress on your recovering back. Always use proper lifting techniques, including:  Bending your knees.  Keeping the load close to your body.  Avoiding twisting.  Find a comfortable position to sleep. Use a firm mattress and lie on your side with your knees slightly bent. If you lie on your back, put a pillow under your knees.  Avoid feeling anxious or stressed.Stress increases muscle tension and can worsen back pain.It is important to recognize when you are anxious or  stressed and learn ways to manage it, such as with exercise.  Take medicines only as directed by your health care provider. Over-the-counter medicines to reduce pain and inflammation are often the most helpful.Your health care provider may prescribe muscle relaxant drugs.These medicines help dull your pain so you can more quickly return to your normal activities and healthy exercise.  Apply ice to the injured area:  Put ice in a plastic bag.  Place a towel between your skin and the bag.  Leave the ice on for 20 minutes, 2-3 times a day for the first 2-3 days. After that, ice and heat may be alternated to reduce pain and spasms.  Maintain a healthy weight. Excess weight puts extra stress on your back and makes it difficult to maintain good posture. SEEK MEDICAL CARE IF:  You have pain that is not relieved with rest or medicine.  You have increasing pain going down into the legs or buttocks.  You have pain that does not improve in one week.  You have night pain.  You lose weight.  You have a fever or chills. SEEK IMMEDIATE MEDICAL CARE IF:   You develop new bowel or bladder control problems.  You have unusual weakness or numbness in your arms or legs.  You develop nausea or vomiting.  You develop abdominal pain.  You feel faint.   This information is not intended to replace advice given to you by your health care provider. Make sure you discuss  any questions you have with your health care provider.   Document Released: 08/26/2005 Document Revised: 09/16/2014 Document Reviewed: 12/28/2013 Elsevier Interactive Patient Education 2016 [employer]  Urinary Tract Infection Urinary tract infections (UTIs) can develop anywhere along your urinary tract. Your urinary tract is your body's drainage system for removing wastes and extra water. Your urinary tract includes two kidneys, two ureters, a bladder, and a urethra. Your kidneys are a pair of bean-shaped organs. Each kidney is  about the size of your fist. They are located below your ribs, one on each side of your spine. CAUSES Infections are caused by microbes, which are microscopic organisms, including fungi, viruses, and bacteria. These organisms are so small that they can only be seen through a microscope. Bacteria are the microbes that most commonly cause UTIs. SYMPTOMS  Symptoms of UTIs may vary by age and gender of the patient and by the location of the infection. Symptoms in young women typically include a frequent and intense urge to urinate and a painful, burning feeling in the bladder or urethra during urination. Older women and men are more likely to be tired, shaky, and weak and have muscle aches and abdominal pain. A fever may mean the infection is in your kidneys. Other symptoms of a kidney infection include pain in your back or sides below the ribs, nausea, and vomiting. DIAGNOSIS To diagnose a UTI, your caregiver will ask you about your symptoms. Your caregiver will also ask you to provide a urine sample. The urine sample will be tested for bacteria and white blood cells. White blood cells are made by your body to help fight infection. TREATMENT  Typically, UTIs can be treated with medication. Because most UTIs are caused by a bacterial infection, they usually can be treated with the use of antibiotics. The choice of antibiotic and length of treatment depend on your symptoms and the type of bacteria causing your infection. HOME CARE INSTRUCTIONS  If you were prescribed antibiotics, take them exactly as your caregiver instructs you. Finish the medication even if you feel better after you have only taken some of the medication.  Drink enough water and fluids to keep your urine clear or pale yellow.  Avoid caffeine, tea, and carbonated beverages. They tend to irritate your bladder.  Empty your bladder often. Avoid holding urine for long periods of time.  Empty your bladder before and after sexual  intercourse.  After a bowel movement, women should cleanse from front to back. Use each tissue only once. SEEK MEDICAL CARE IF:   You have back pain.  You develop a fever.  Your symptoms do not begin to resolve within 3 days. SEEK IMMEDIATE MEDICAL CARE IF:   You have severe back pain or lower abdominal pain.  You develop chills.  You have nausea or vomiting.  You have continued burning or discomfort with urination. MAKE SURE YOU:   Understand these instructions.  Will watch your condition.  Will get help right away if you are not doing well or get worse.   This information is not intended to replace advice given to you by your health care provider. Make sure you discuss any questions you have with your health care provider.   Document Released: 06/05/2005 Document Revised: 05/17/2015 Document Reviewed: 10/04/2011 Elsevier Interactive Patient Education [employer]

## 2015-09-16 LAB — URINE CULTURE

## 2015-09-16 NOTE — ED Notes (Signed)
Pt not present upon our arrival

## 2015-09-21 IMAGING — CT CT ABD-PELV W/O CM
2 of 4 series · 17 of 46 positions shown, 19 images · non-contrast
Comparison: 06/26/2013

CLINICAL DATA: Abdominal pain, nausea, vomiting

EXAM:
CT ABDOMEN AND PELVIS WITHOUT CONTRAST
TECHNIQUE: Multidetector CT imaging of the abdomen and pelvis was performed
following the standard protocol without intravenous contrast.

[Series 2: abd/ pelvis 5.0 i30f 1 · axial · 0.88mm/px · z∈[+924,+1388]mm · 14 of 103 slices shown, 16 images]
[im 5/103  soft-tissue]
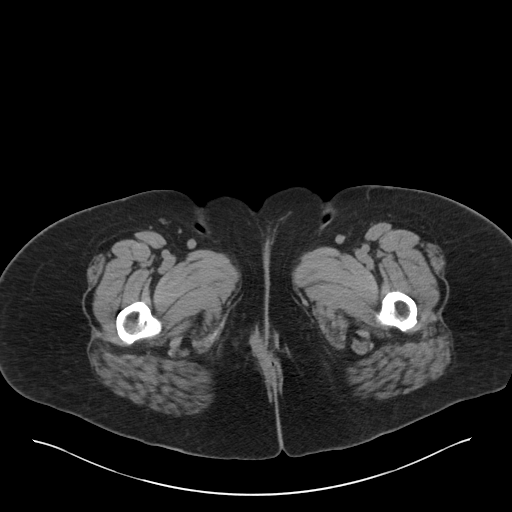
[im 5/103  bone]
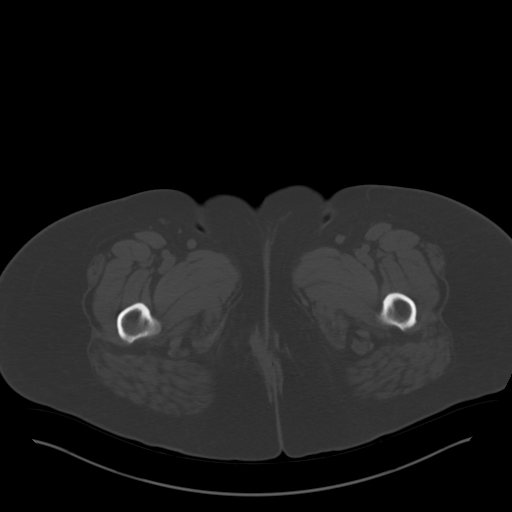
[im 13/103  soft-tissue]
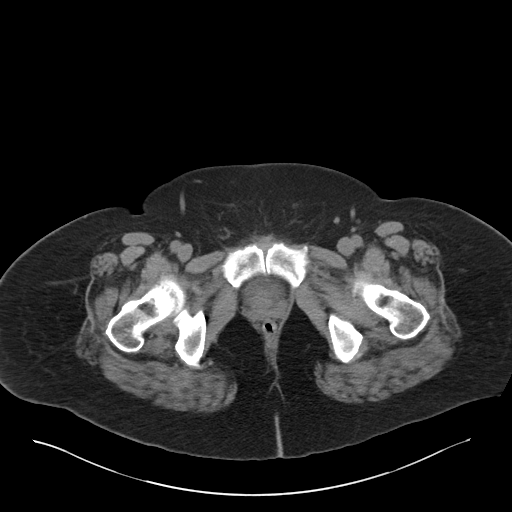
[im 22/103  soft-tissue]
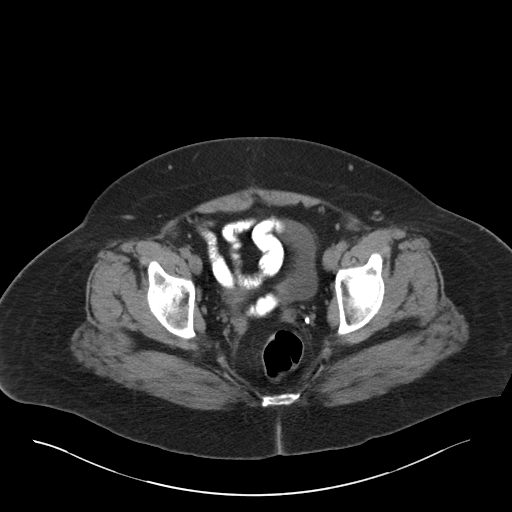
[im 26/103  soft-tissue]
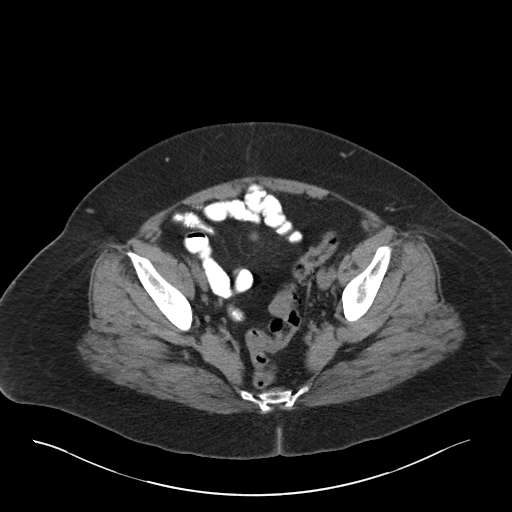
[im 35/103  soft-tissue]
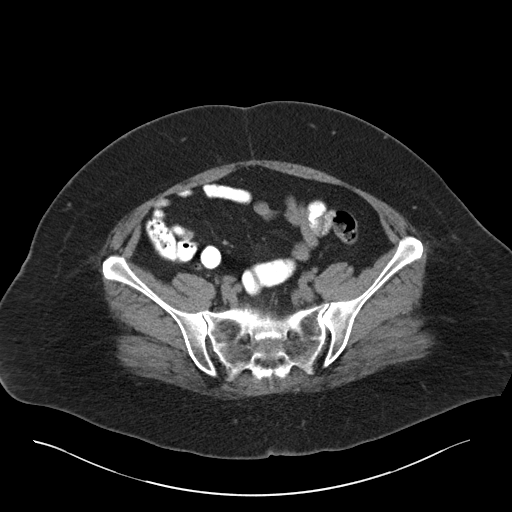
[im 43/103  soft-tissue]
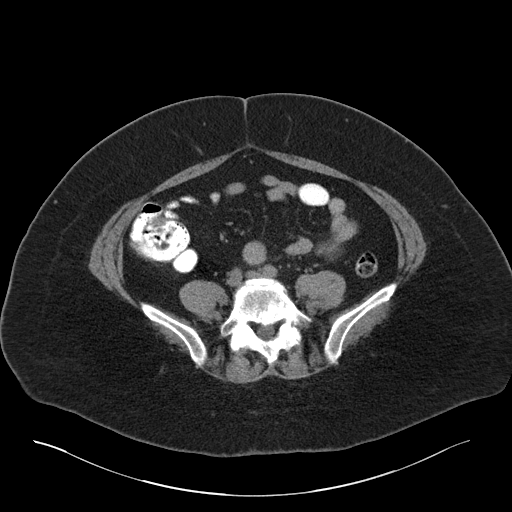
[im 47/103  soft-tissue]
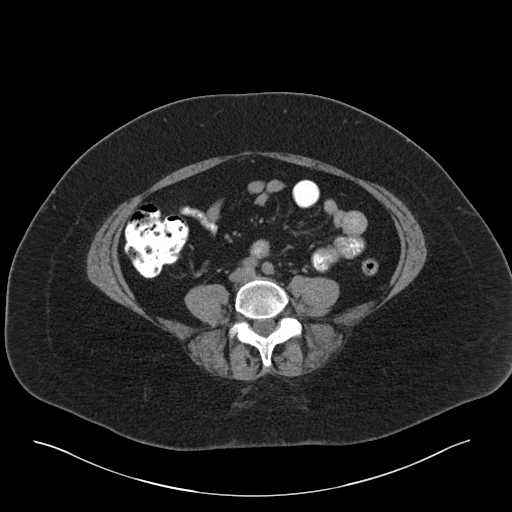
[im 56/103  soft-tissue]
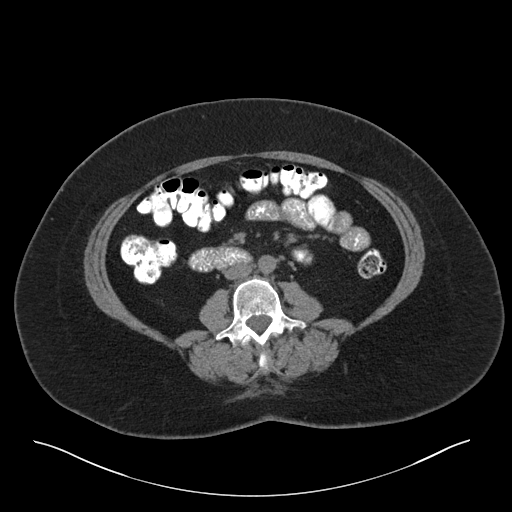
[im 60/103  soft-tissue]
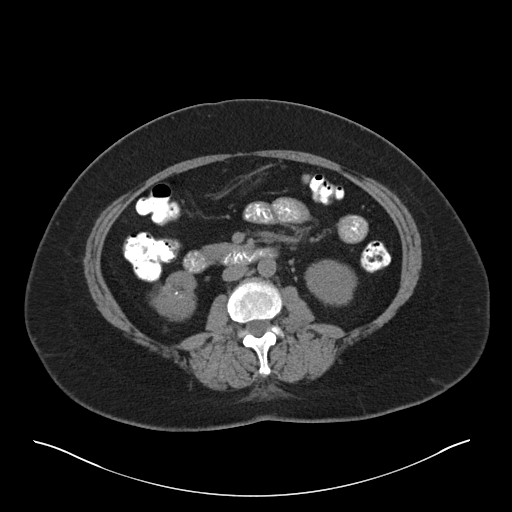
[im 60/103  bone]
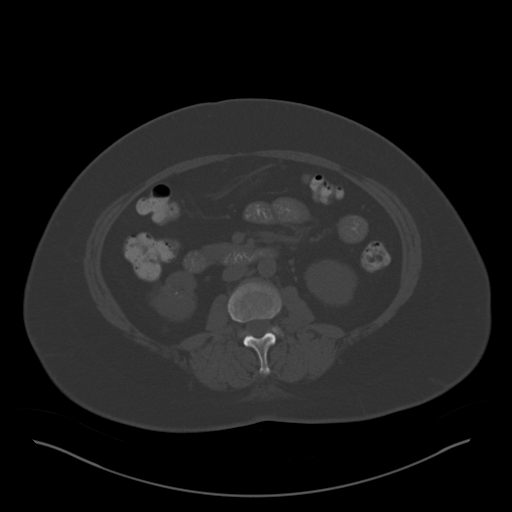
[im 69/103  soft-tissue]
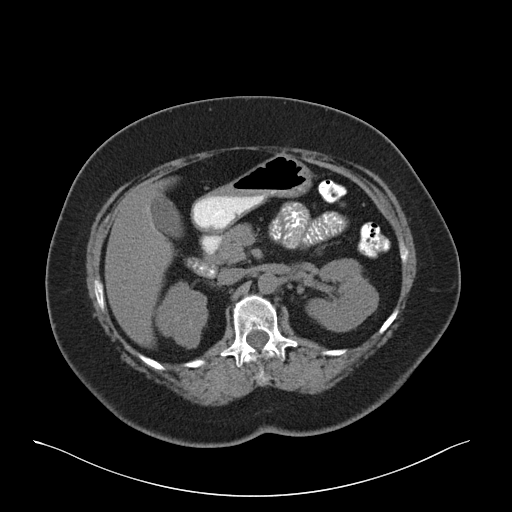
[im 77/103  soft-tissue]
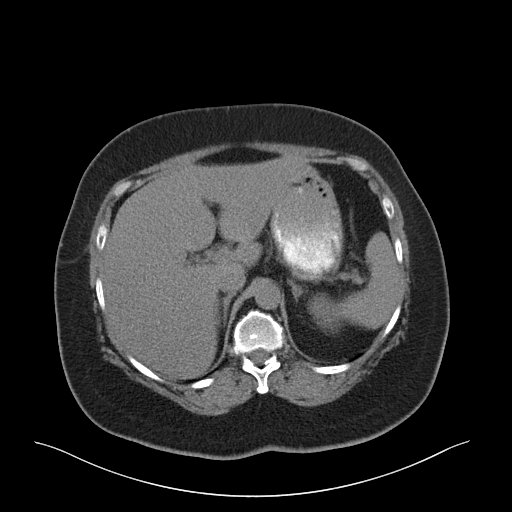
[im 81/103  soft-tissue]
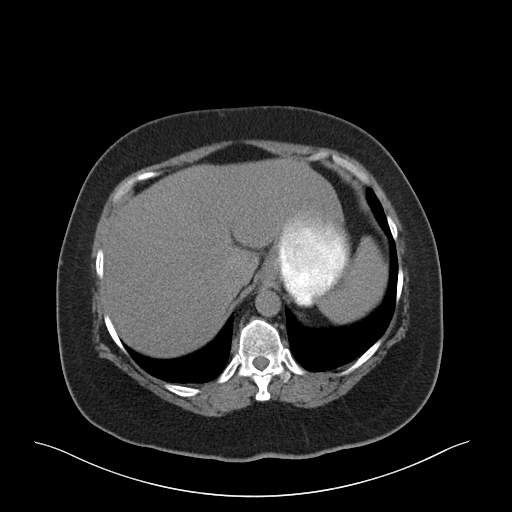
[im 90/103  soft-tissue]
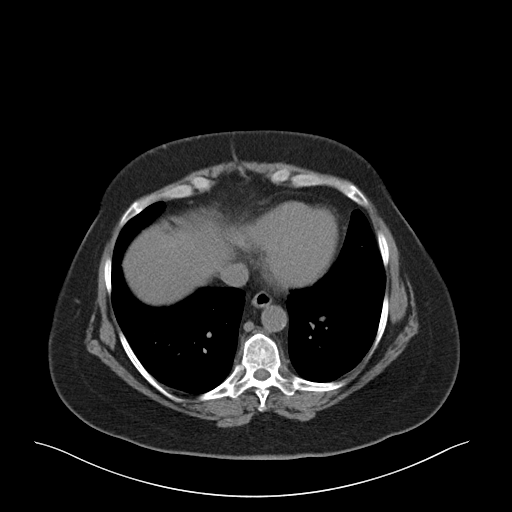
[im 98/103  soft-tissue]
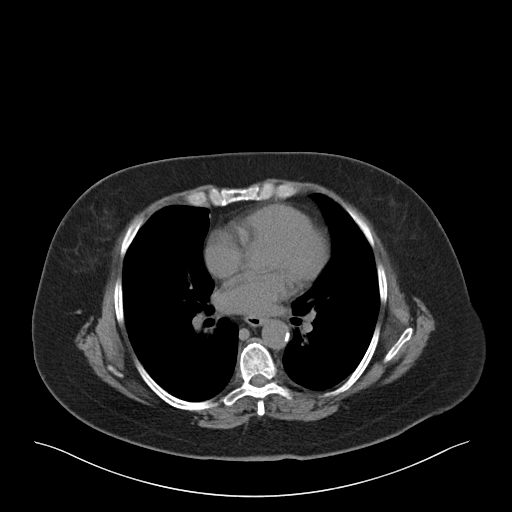

[Series 5: cor st · coronal · 1.00mm/px · 3 of 107 slices shown]
[im 36/107  soft-tissue]
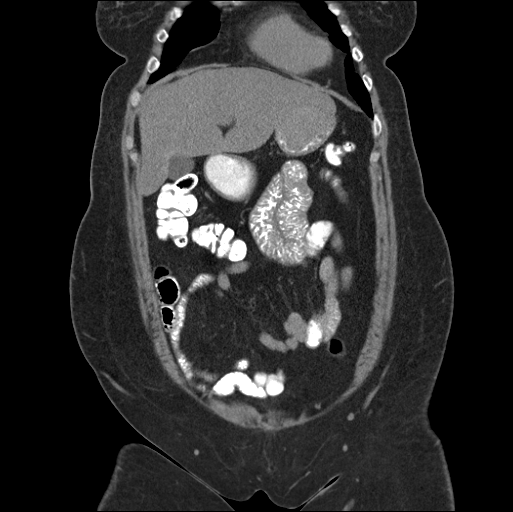
[im 48/107  soft-tissue]
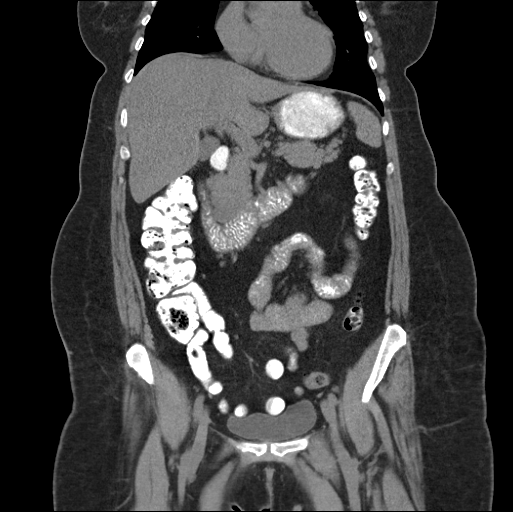
[im 59/107  soft-tissue]
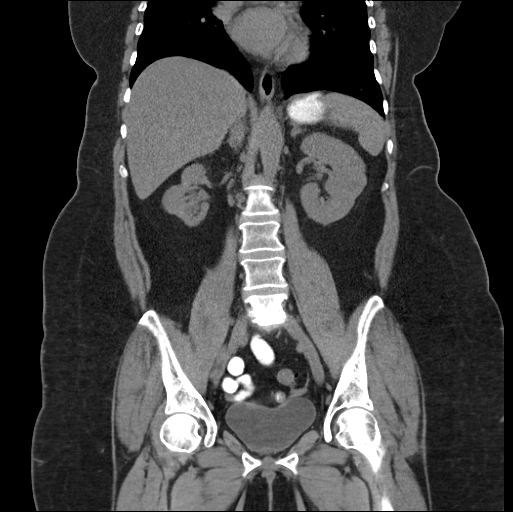

[17 of 46 positions shown; findings below may reference images not displayed]

FINDINGS: Hypoventilation appreciated within the lung bases. Ill-defined area
of ground-glass appearing density projects along the posterior
periphery of the left hemi thorax. This area was not included on the
previous CT. Differential considerations are atelectasis versus
infiltrate versus scarring. The area measures 8.5 mm in diameter
image number 5 series 3. A pulmonary nodule cannot be excluded and
further evaluation with nonemergent, dedicated noncontrast chest CT
is recommended.

The liver, spleen, adrenals, pancreas, are unremarkable. Stable
coarse calcifications identified within the cortex of the right
kidney as well as small medullary calculi. Stable bilateral cortical
scarring is identified within the kidneys. There is no evidence of
hydronephrosis, hydroureter, no ureterolithiasis on the right or
left.

There is no evidence of bowel obstruction, enteritis, colitis, nor
diverticulitis within the limitations of a non IV contrast CT. The
appendix is appreciated and is unremarkable. There is no evidence of
abdominal aortic aneurysm.

There is no evidence of abdominal pelvic free fluid, loculated fluid
collections, masses, nor adenopathy. Diverticulosis is appreciated
within the sigmoid colon. Degenerative disc disease changes
appreciated L5-S1 level.
IMPRESSION: No CT evidence of obstructive or inflammatory abnormalities.

Indeterminate ground-glass nodular density left lung base further
evaluation nonemergent noncontrast CT chest CT recommended.

## 2015-09-23 IMAGING — CR DG RIBS 2V*L*
1 series · 5 of 5 positions shown · non-contrast
Comparison: None.

CLINICAL DATA: Fell, pain left axillary region.

EXAM:
CHEST WITH LEFT RIBS - 2 VIEW

[Series 1: w chest pa · 0.14mm/px · 5 of 5 slices shown]
[im 1/5]
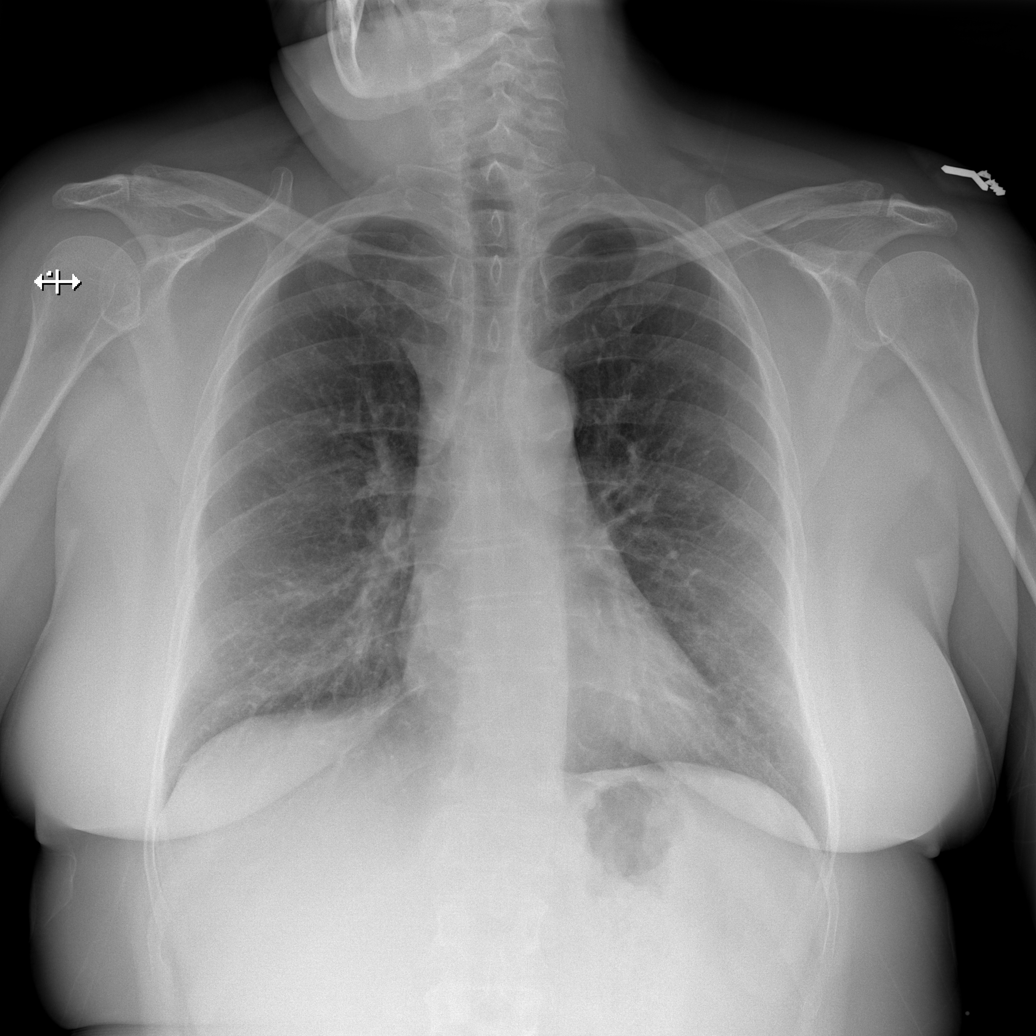
[im 2/5]
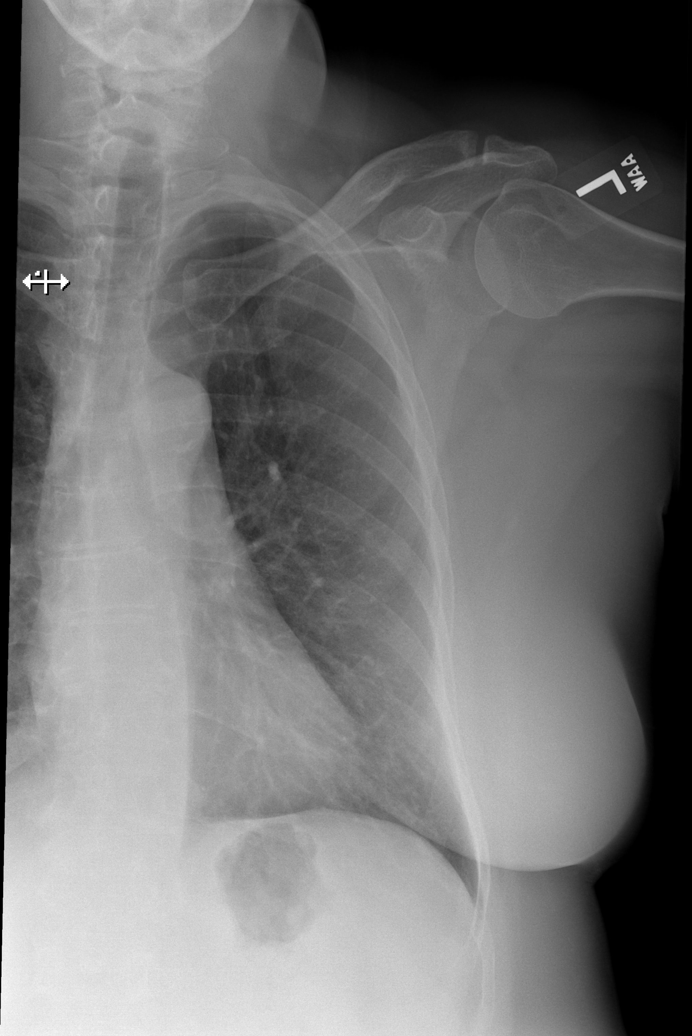
[im 3/5]
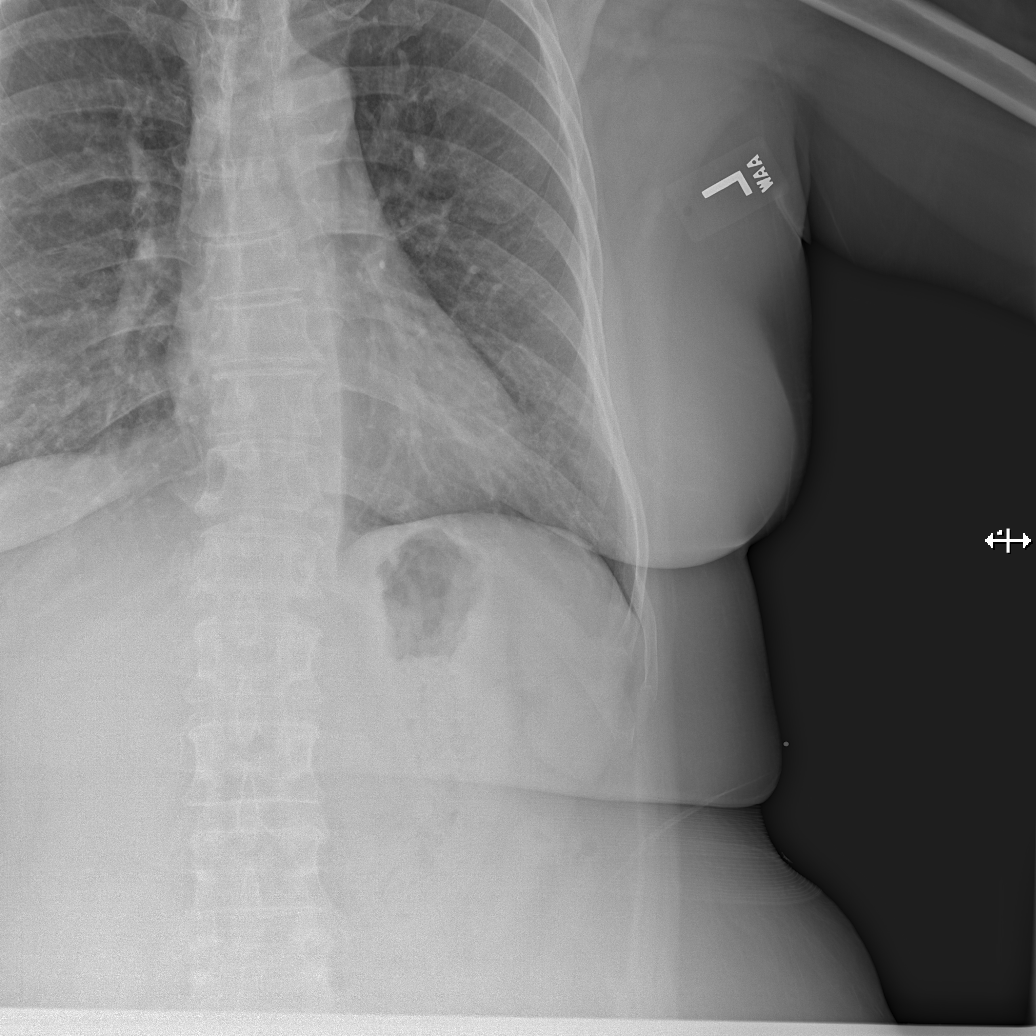
[im 4/5]
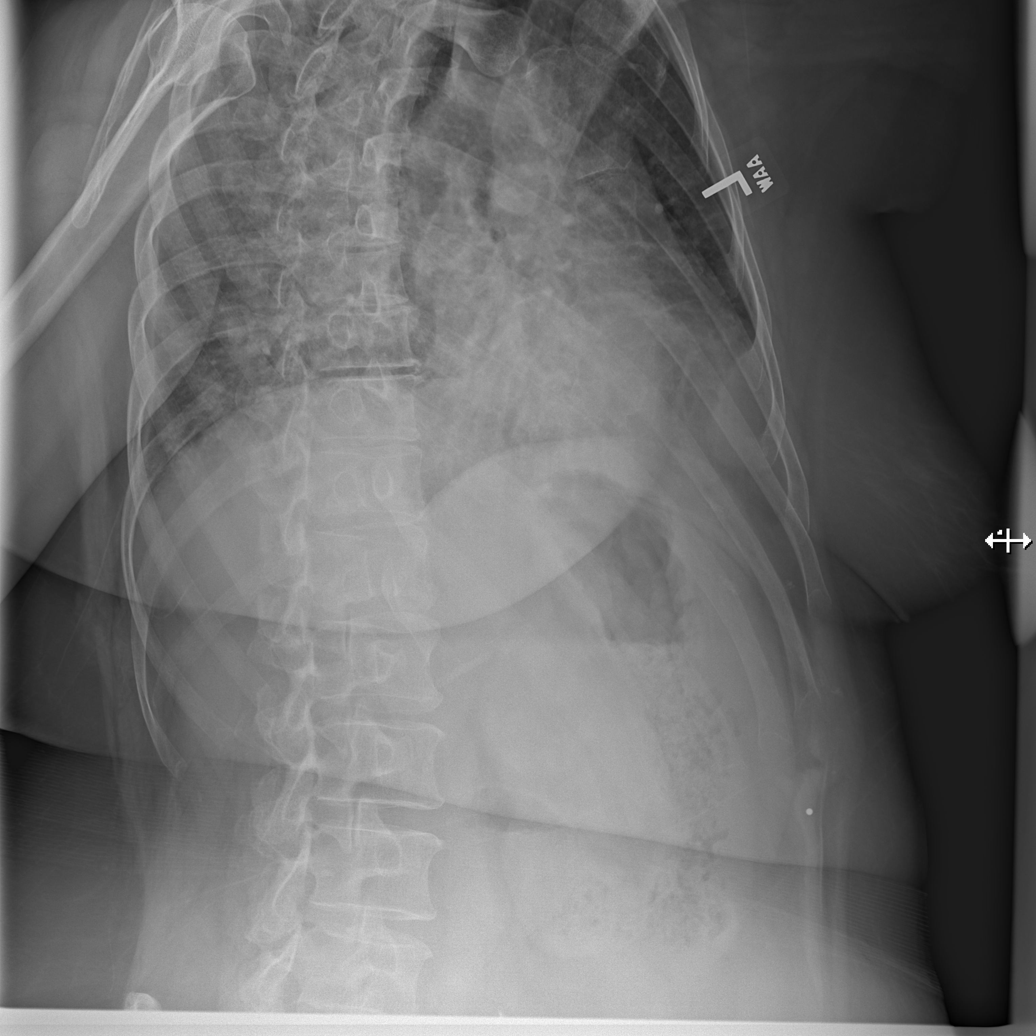
[im 5/5]
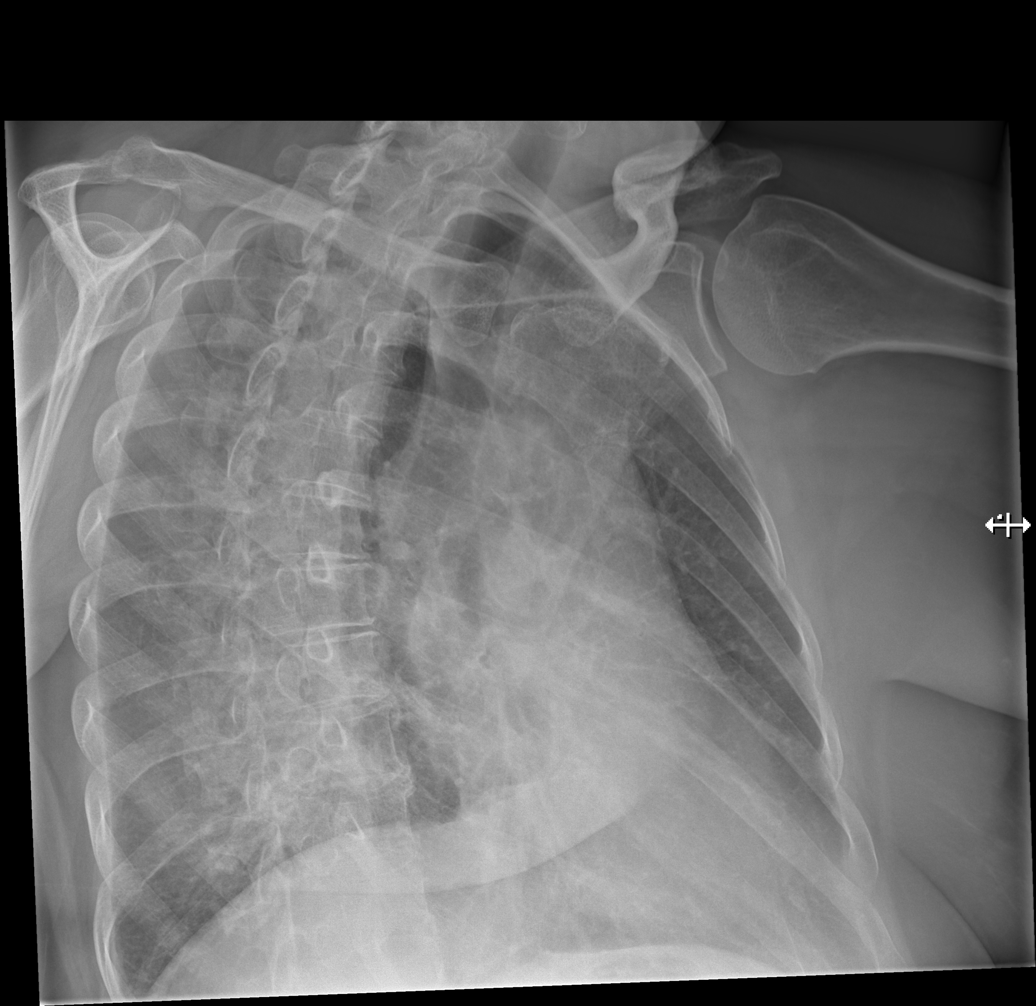

[5 of 5 positions shown; findings below may reference images not displayed]

FINDINGS: No fracture or other bone lesions are seen involving the ribs. There
is no evidence of pneumothorax or pleural effusion. Both lungs are
clear. Heart size and mediastinal contours are within normal limits.
IMPRESSION: Negative.

## 2015-09-23 IMAGING — CR DG LUMBAR SPINE 2-3V
1 series · 3 of 3 positions shown · non-contrast
Comparison: None.

CLINICAL DATA: Fall, lumbar pain

EXAM:
LUMBAR SPINE - 2-3 VIEW

[Series 1: ap · 0.17mm/px · 3 of 3 slices shown]
[im 1/3]
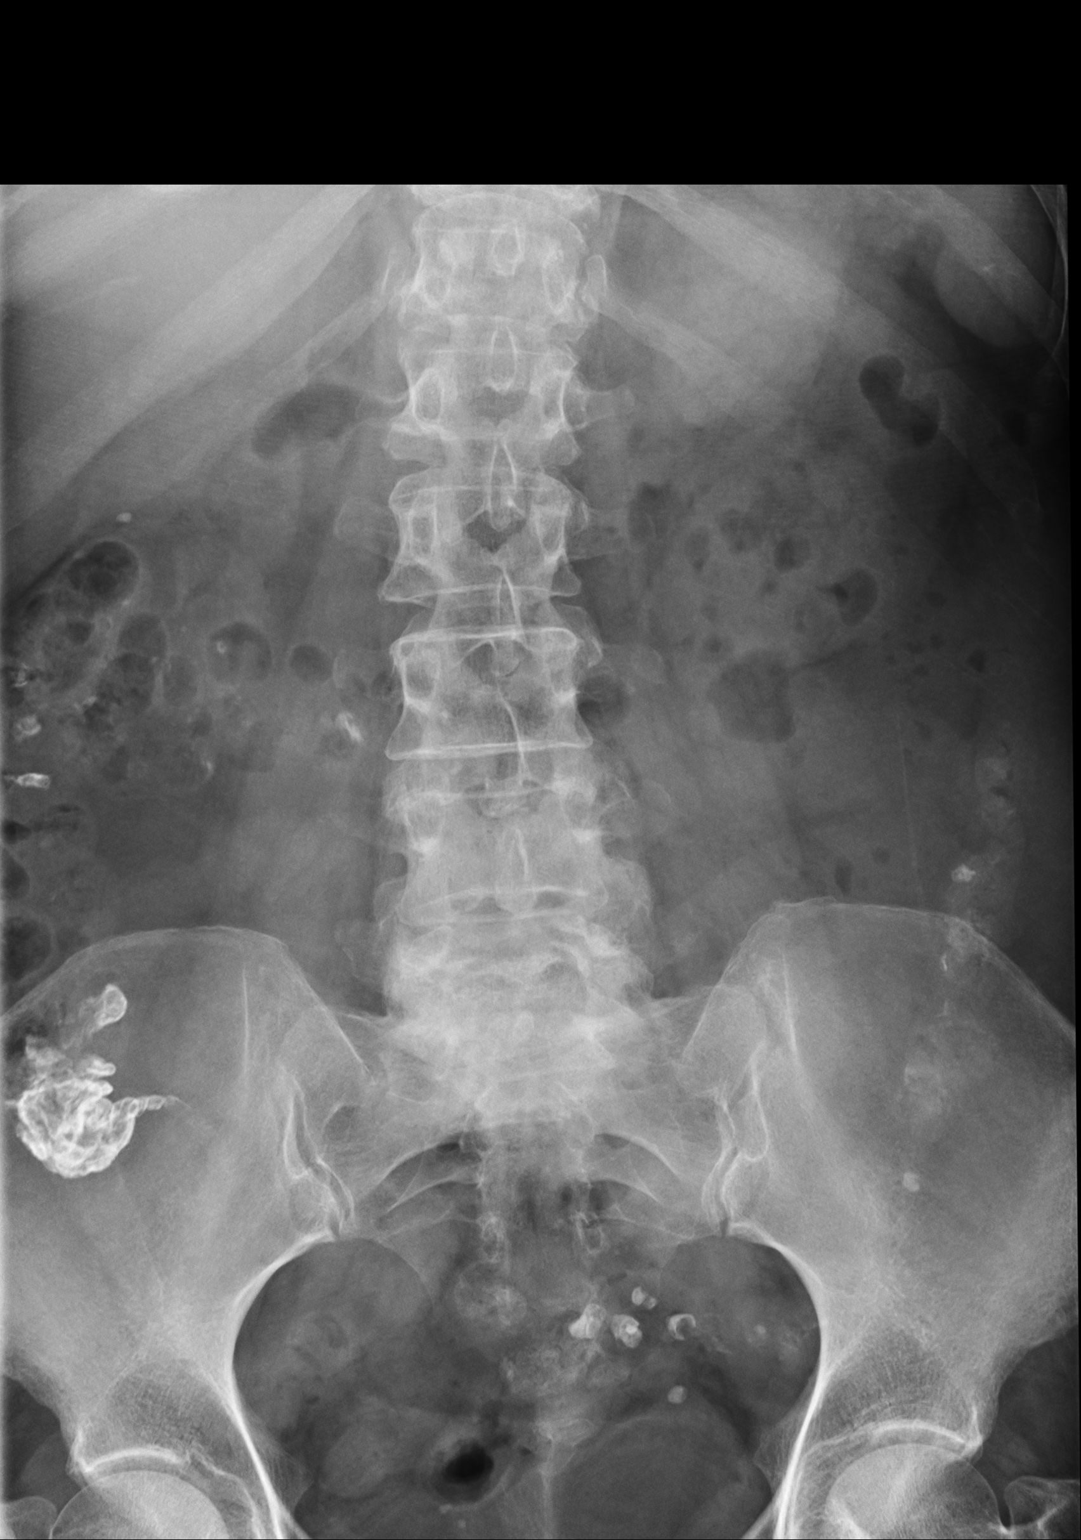
[im 2/3]
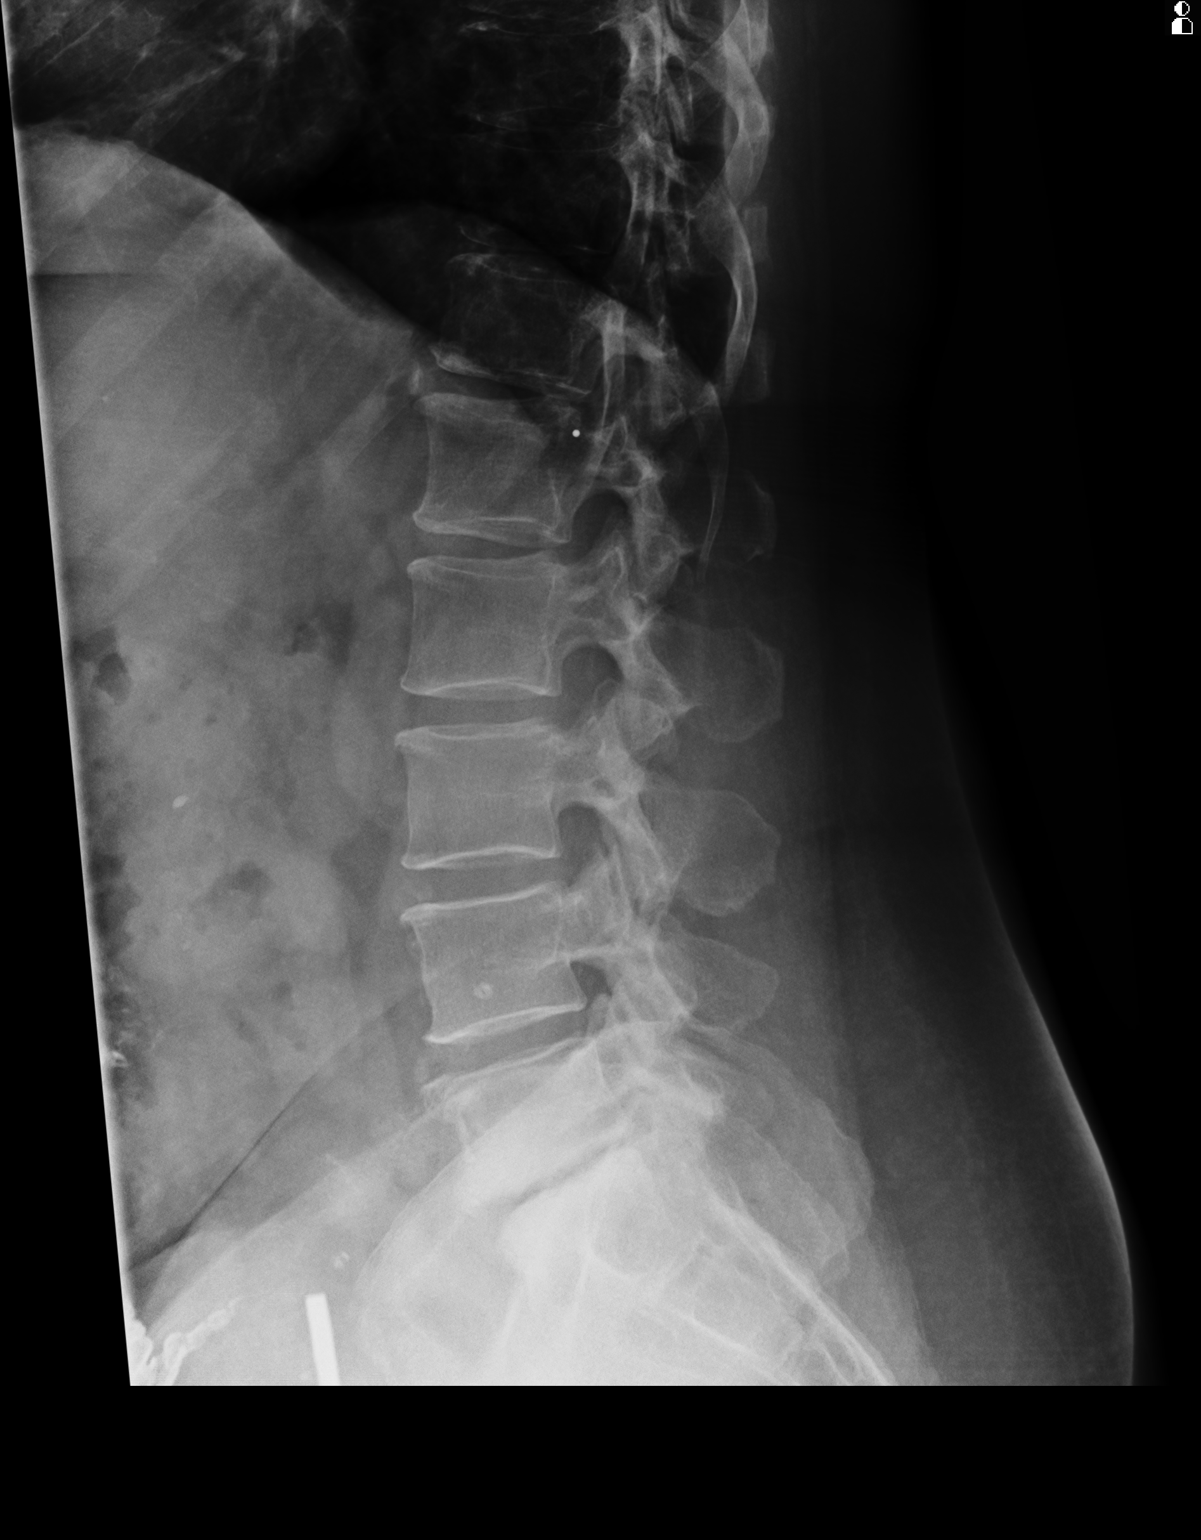
[im 3/3]
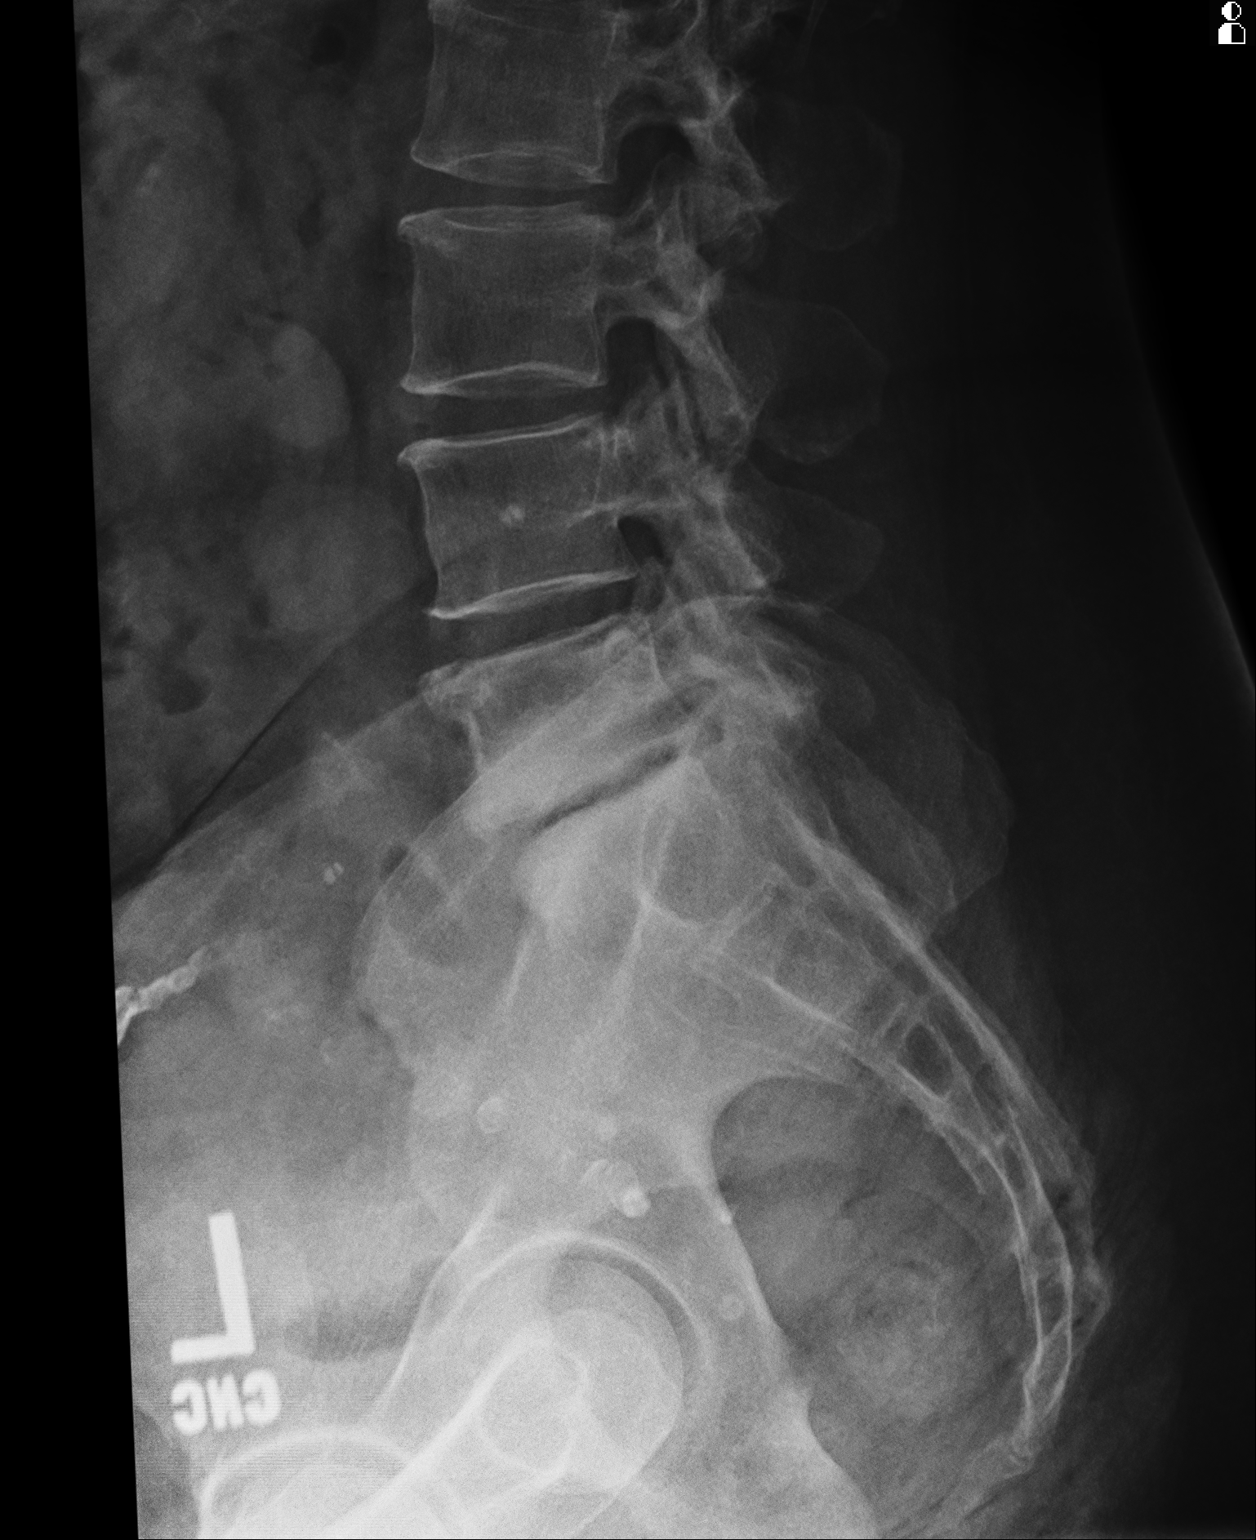

[3 of 3 positions shown; findings below may reference images not displayed]

FINDINGS: Three views of lumbar spine submitted. No acute fracture or
subluxation. Significant disc space flattening with endplate
sclerotic changes at L5-S1 level. Colonic diverticula are noted.
IMPRESSION: No acute fracture or subluxation. Significant disc space flattening
with endplate sclerotic changes at L5-S1 level.

## 2016-01-12 DIAGNOSIS — R296 Repeated falls: Secondary | ICD-10-CM | POA: Insufficient documentation

## 2016-01-14 IMAGING — CT CT ABD-PELV W/O CM
1 series · 14 of 18 positions shown, 19 images · non-contrast
Comparison: CT ABD/PELV WO CM dated 08/21/2013

CLINICAL DATA: Flank pain.

EXAM:
CT ABDOMEN AND PELVIS WITHOUT CONTRAST
TECHNIQUE: Multidetector CT imaging of the abdomen and pelvis was performed
following the standard protocol without intravenous contrast.

[Series 4: lung · axial · 0.74mm/px · z∈[+1542,+1618]mm · 14 of 18 slices shown, 19 images]
[im 2/18  soft-tissue]
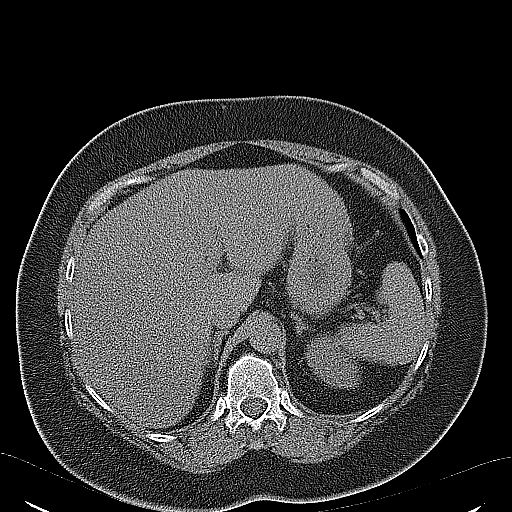
[im 2/18  bone]
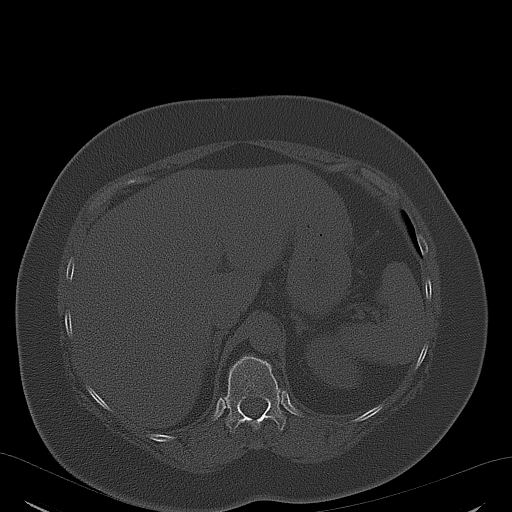
[im 3/18  soft-tissue]
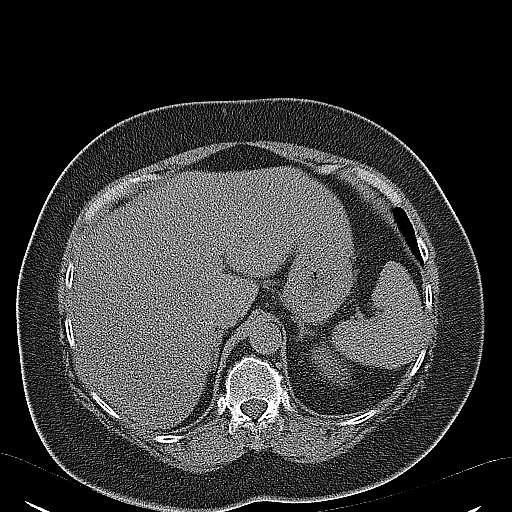
[im 5/18  soft-tissue]
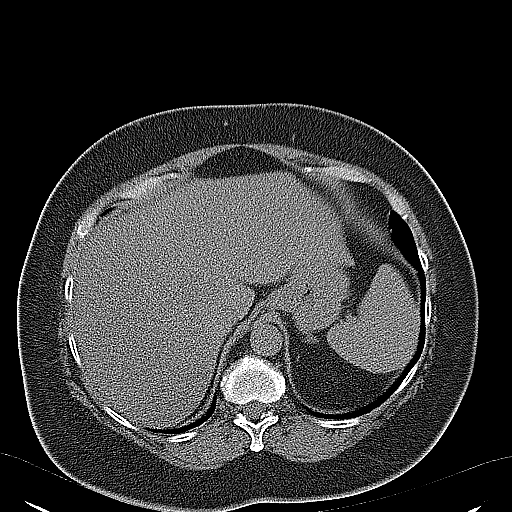
[im 6/18  soft-tissue]
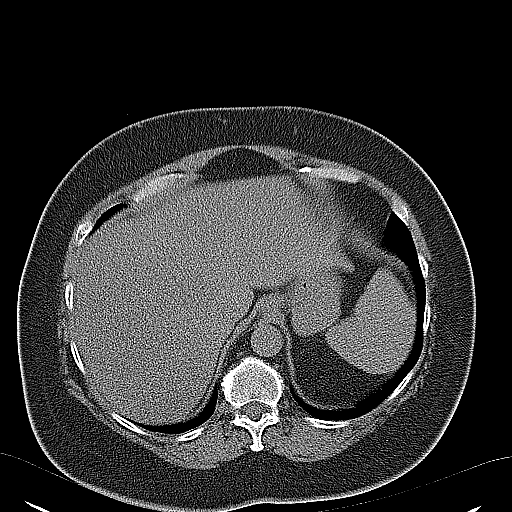
[im 7/18  soft-tissue]
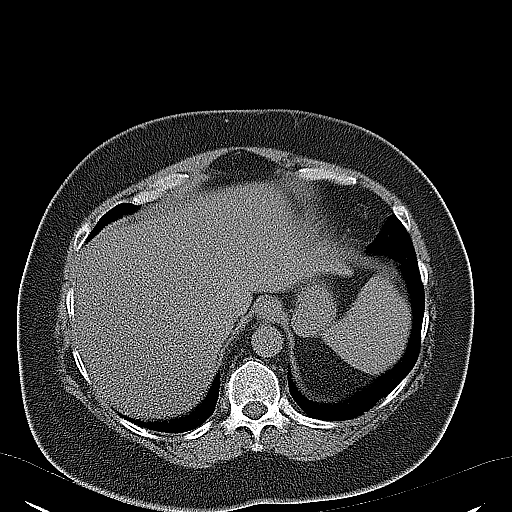
[im 8/18  soft-tissue]
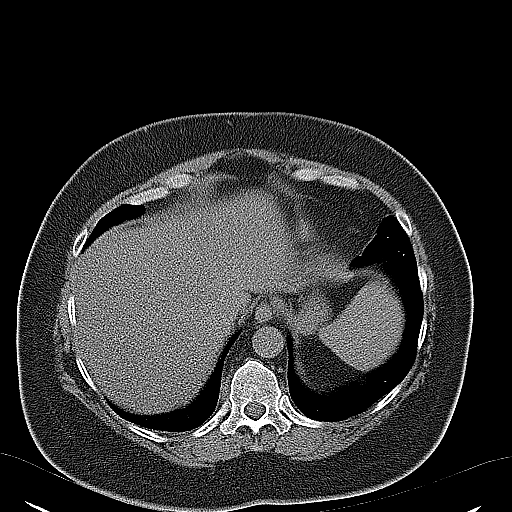
[im 10/18  soft-tissue]
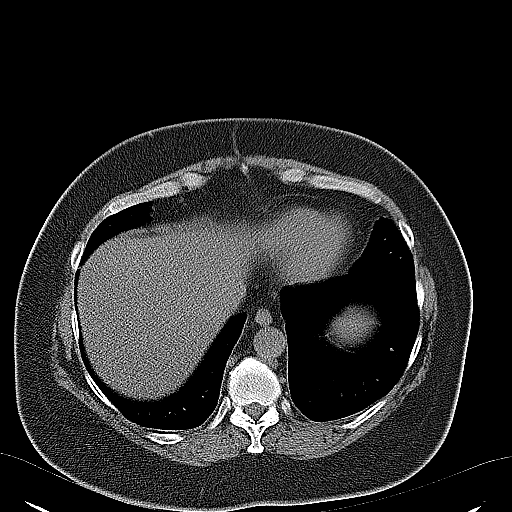
[im 11/18  soft-tissue]
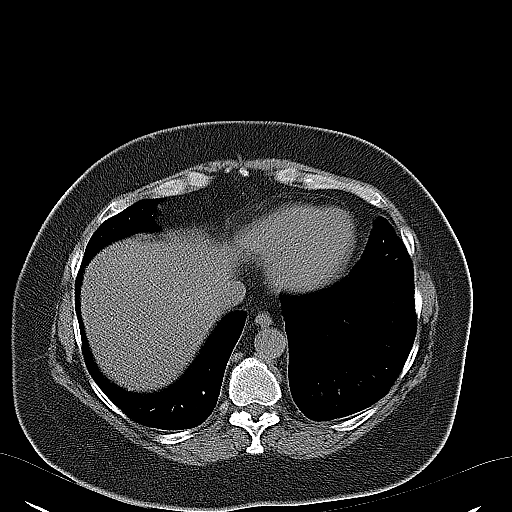
[im 12/18  soft-tissue]
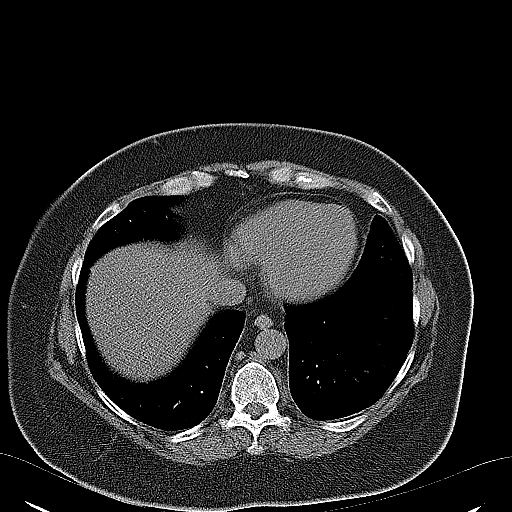
[im 12/18  bone]
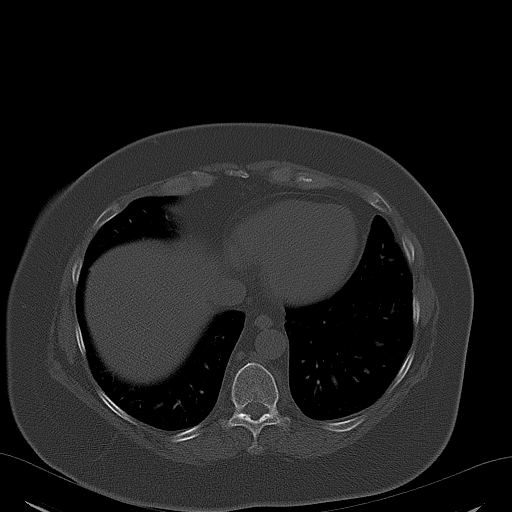
[im 13/18  soft-tissue]
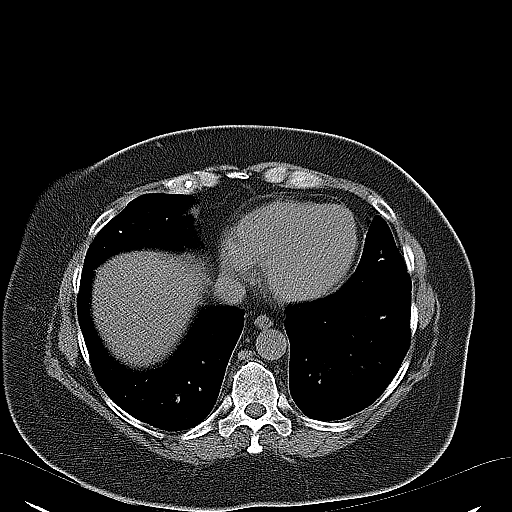
[im 14/18  soft-tissue]
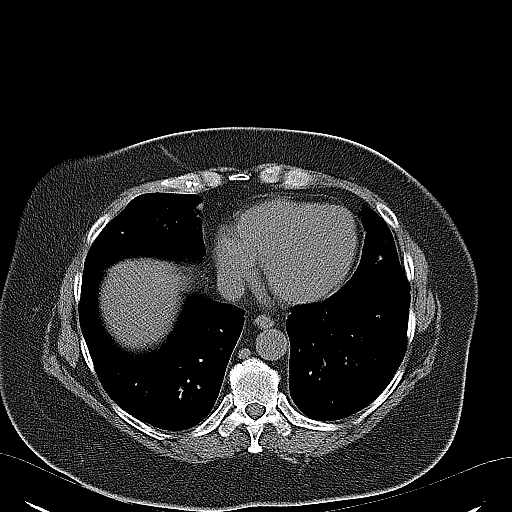
[im 14/18  lung]
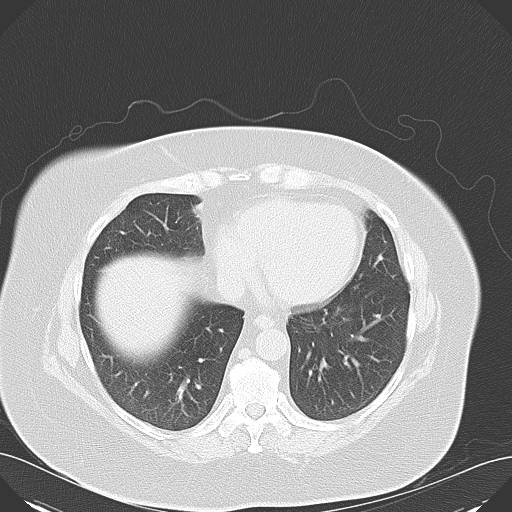
[im 15/18  lung]
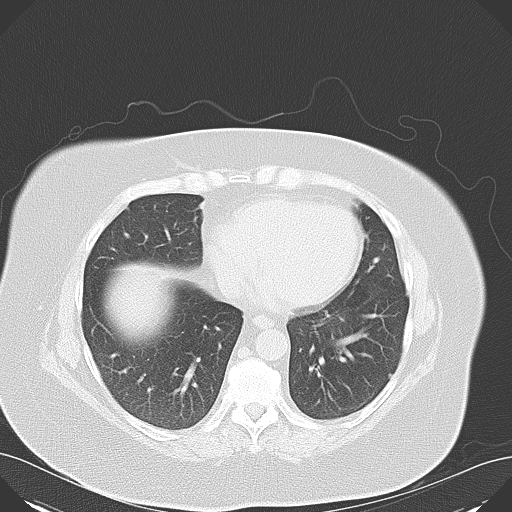
[im 16/18  soft-tissue]
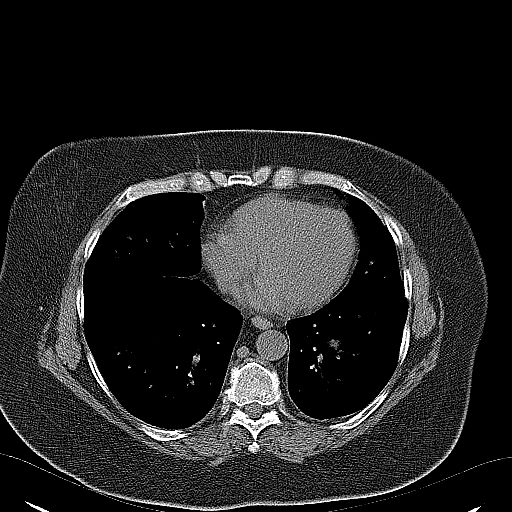
[im 16/18  lung]
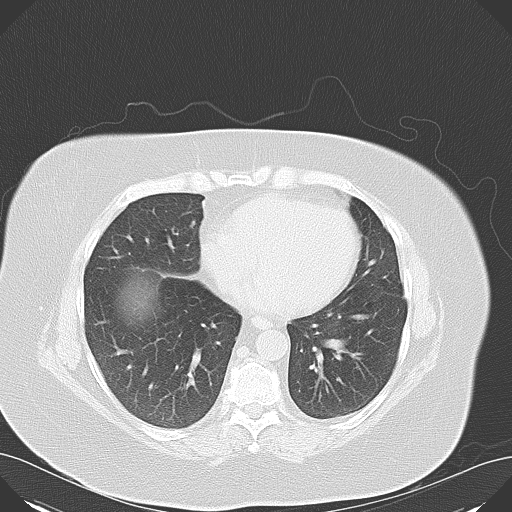
[im 17/18  soft-tissue]
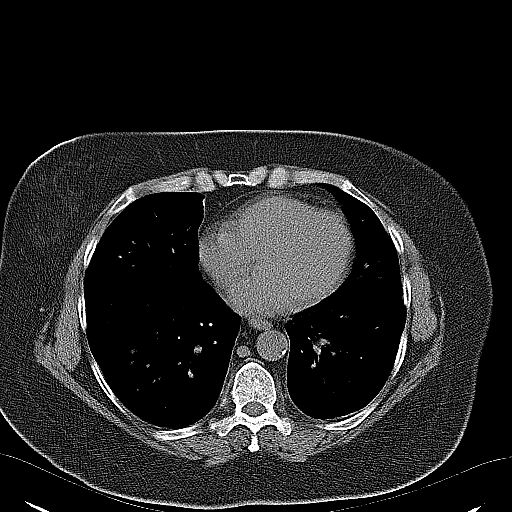
[im 17/18  lung]
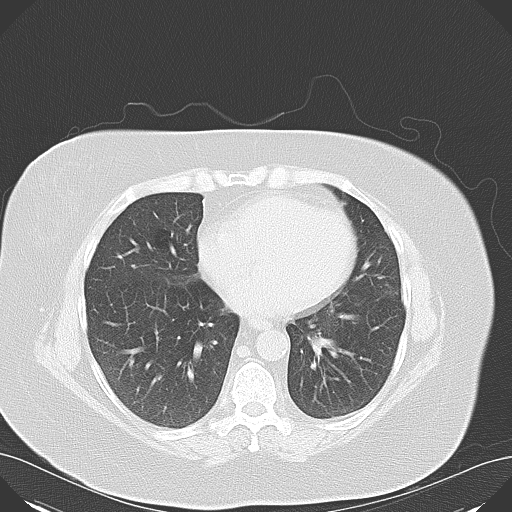

[14 of 18 positions shown; findings below may reference images not displayed]

FINDINGS: Liver normal. Spleen normal. Pancreas normal. No biliary distention.
Gallbladder is unremarkable.

Tiny left adrenal nodule, stable, consistent with adenoma.
Nonobstructive right nephrolithiasis. Stable renal cortical
calcification on the right noted. There is no evidence of
obstructing ureteral stone or hydronephrosis. The bladder is
nondistended. Calcifications within pelvis consistent with
phleboliths. Hysterectomy. No adnexal mass. No free pelvic fluid.

No significant adenopathy.  Abdominal aorta normal in caliber.

Appendix normal. No inflammatory changes in the right or left lower
quadrant. No bowel distention. Diffuse diverticulosis noted. No
evidence of free air. No mesenteric masses.

Abdominal wall is intact. No significant hernia noted. Lung bases
are clear. Heart size normal. No acute bony abnormality.
Degenerative changes lumbosacral spine.
IMPRESSION: 1. No evidence of obstructing urolithiasis. Nonobstructive right
nephrolithiasis.
2. Stable left adrenal nodule consistent with adenoma appear
3. Hysterectomy .

## 2016-01-17 IMAGING — CR DG HAND COMPLETE 3+V*L*
1 series · 3 of 3 positions shown · non-contrast
Comparison: 11/14/2010

CLINICAL DATA: Dog bite along the dorsum of the hand.

EXAM:
LEFT HAND - COMPLETE 3+ VIEW

[Series 1: x hand pa left · 0.14mm/px · 3 of 3 slices shown]
[im 1/3]
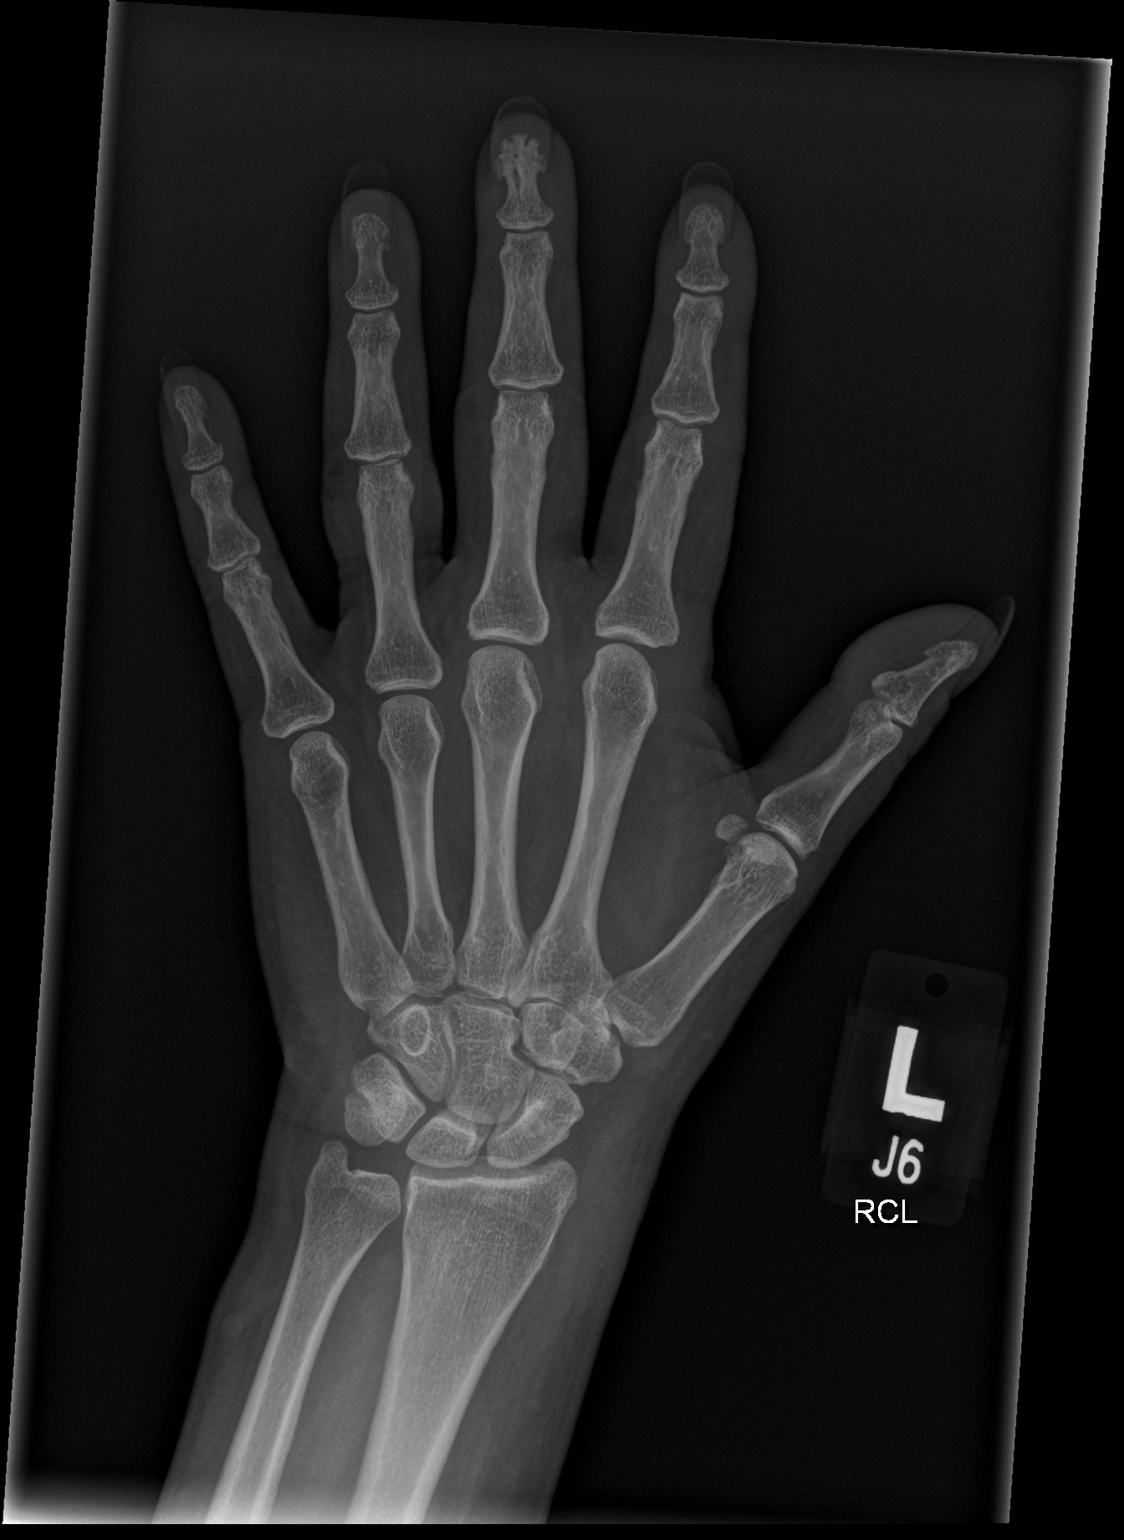
[im 2/3]
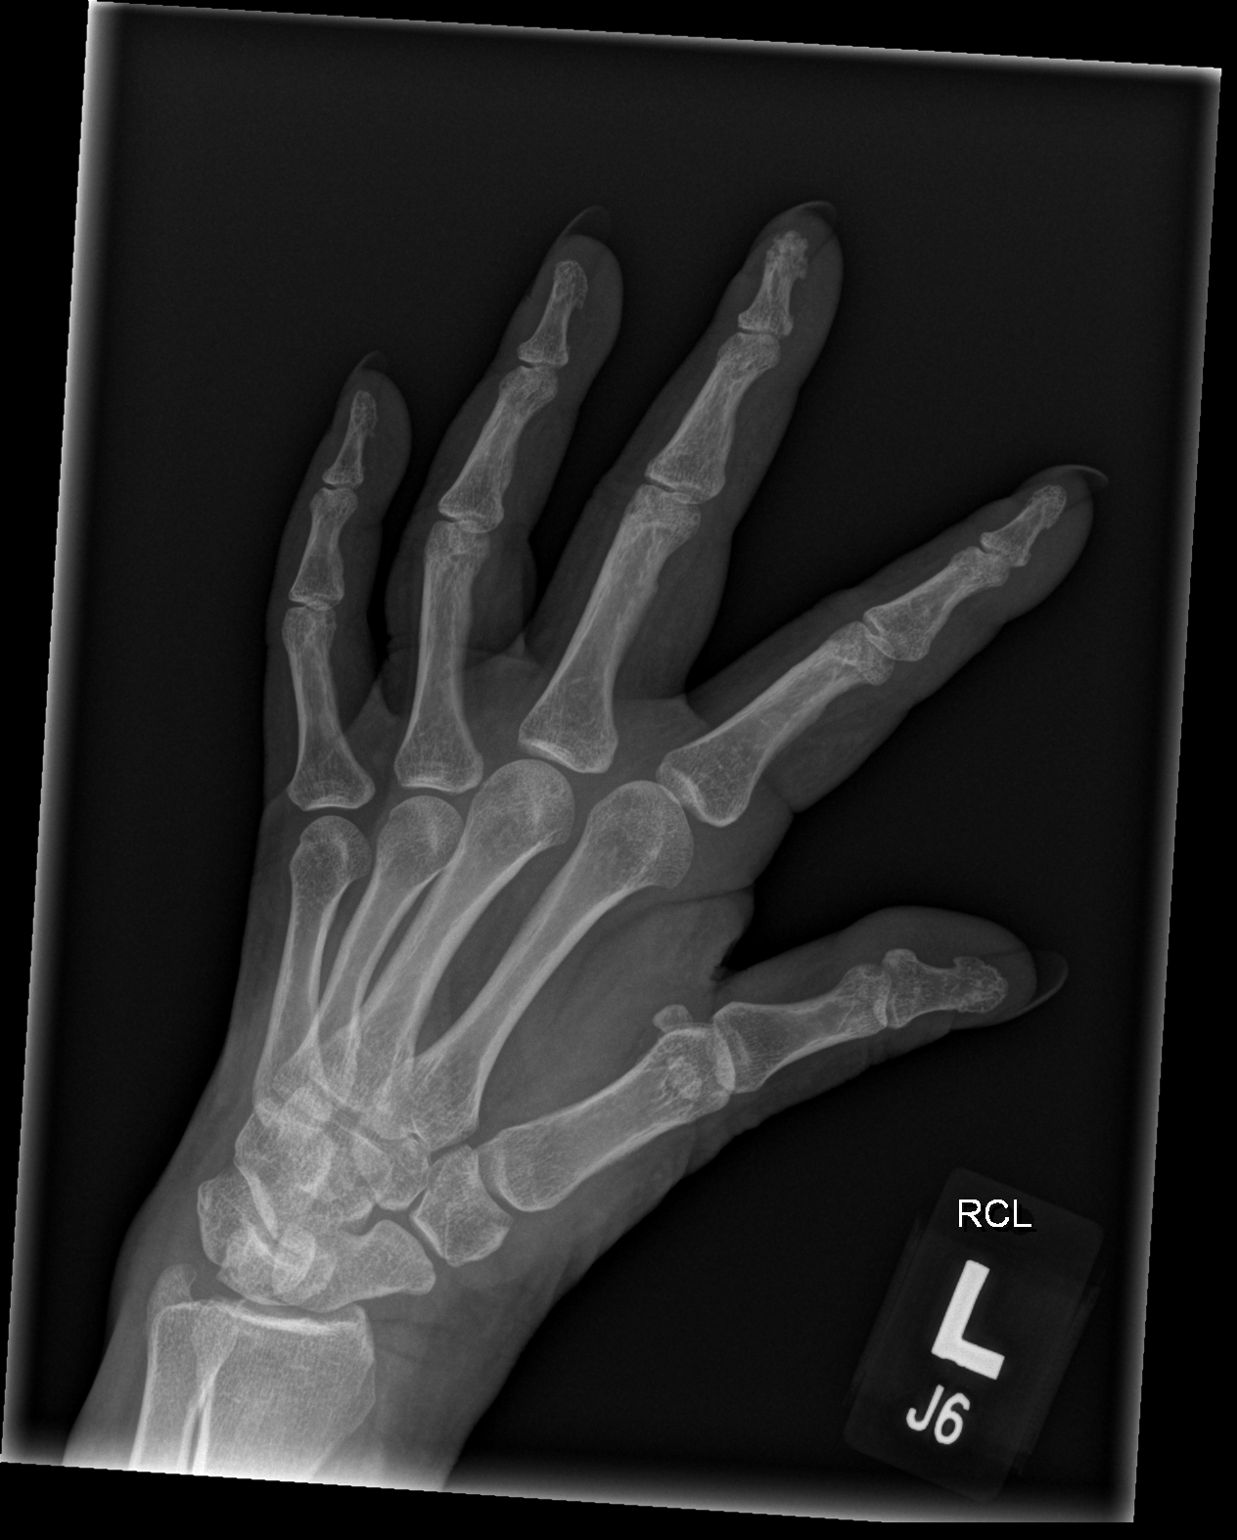
[im 3/3]
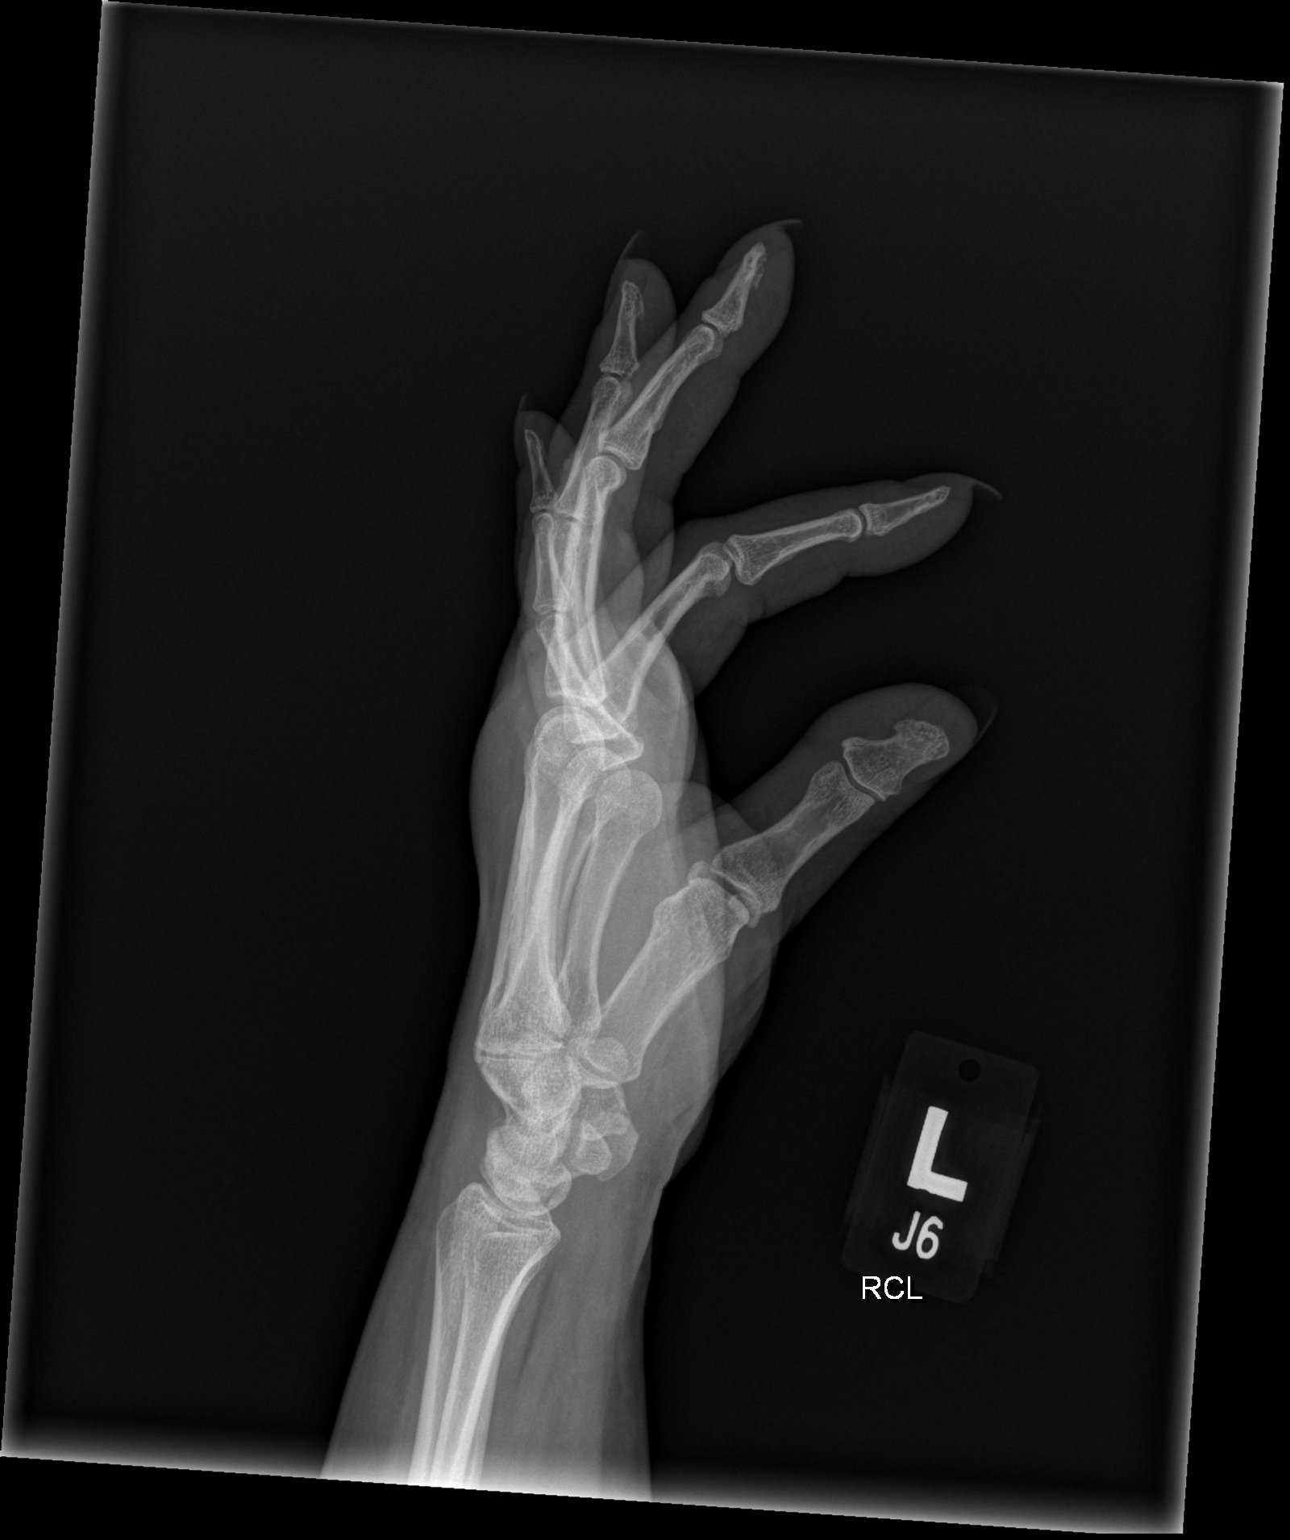

[3 of 3 positions shown; findings below may reference images not displayed]

FINDINGS: No evidence of fracture, dislocation or radiopaque foreign object.
Dorsal soft tissue swelling is evident. There has been old trauma to
the distal tuft of the long finger.
IMPRESSION: No radiographic finding other than dorsal soft tissue swelling.

## 2016-03-02 IMAGING — CR DG ABDOMEN ACUTE W/ 1V CHEST
3 series · 3 of 3 positions shown · non-contrast
Comparison: 12/14/2013 CT and 10/24/2007 chest radiograph

CLINICAL DATA: 48-year-old female with abdominal pain and diarrhea.

EXAM:
ACUTE ABDOMEN SERIES (ABDOMEN 2 VIEW & CHEST 1 VIEW)

[t abdomen supine]
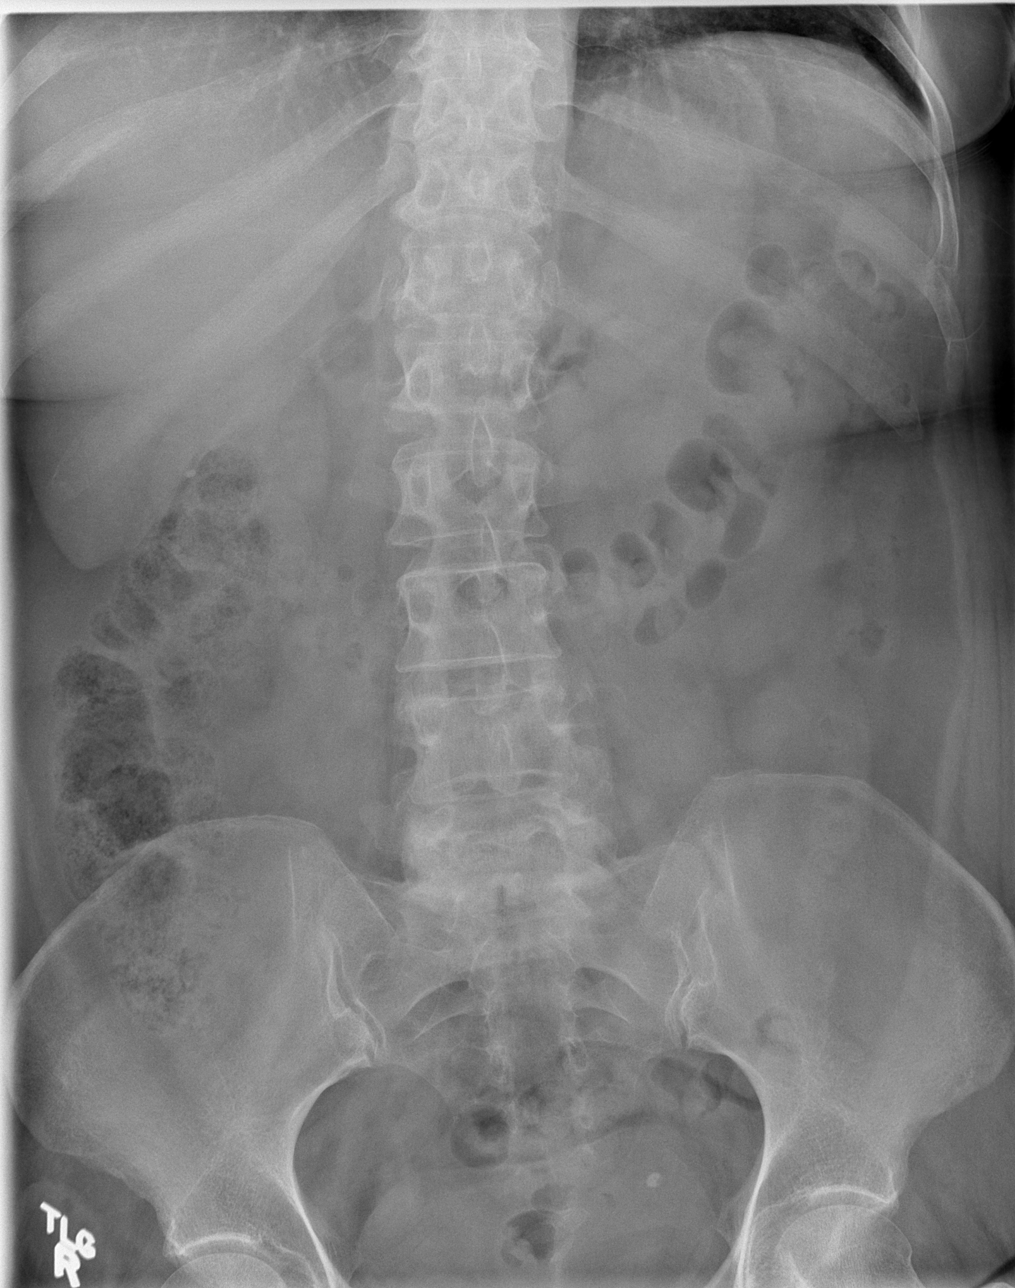

[w chest pa]
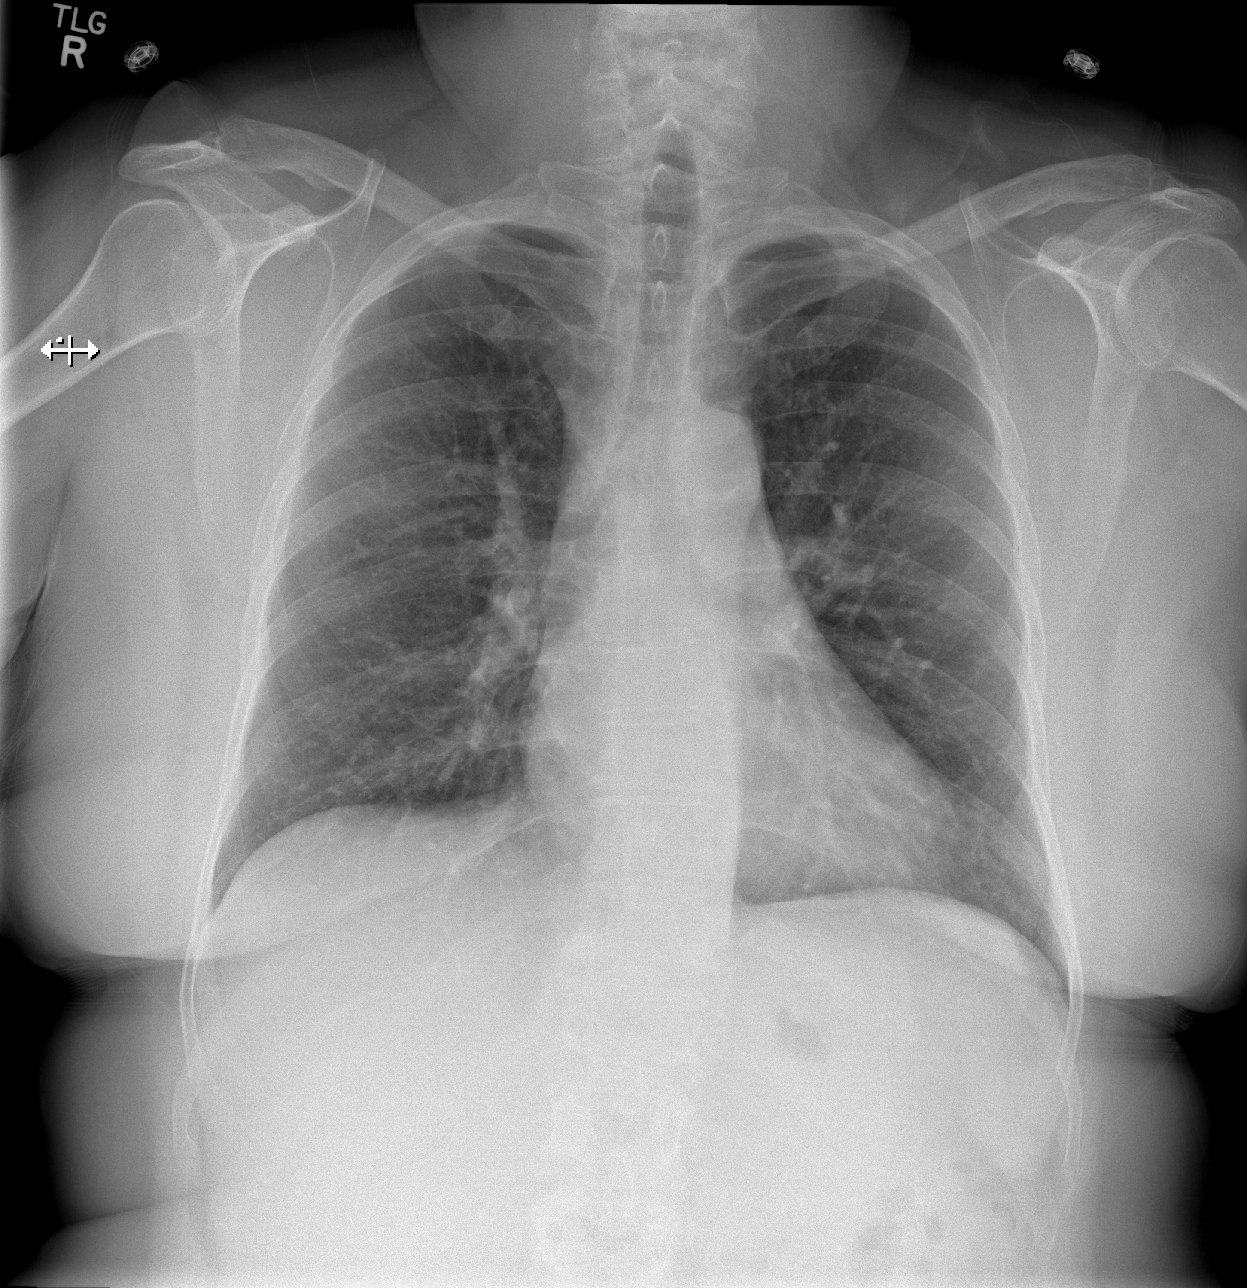

[w abdomen upright]
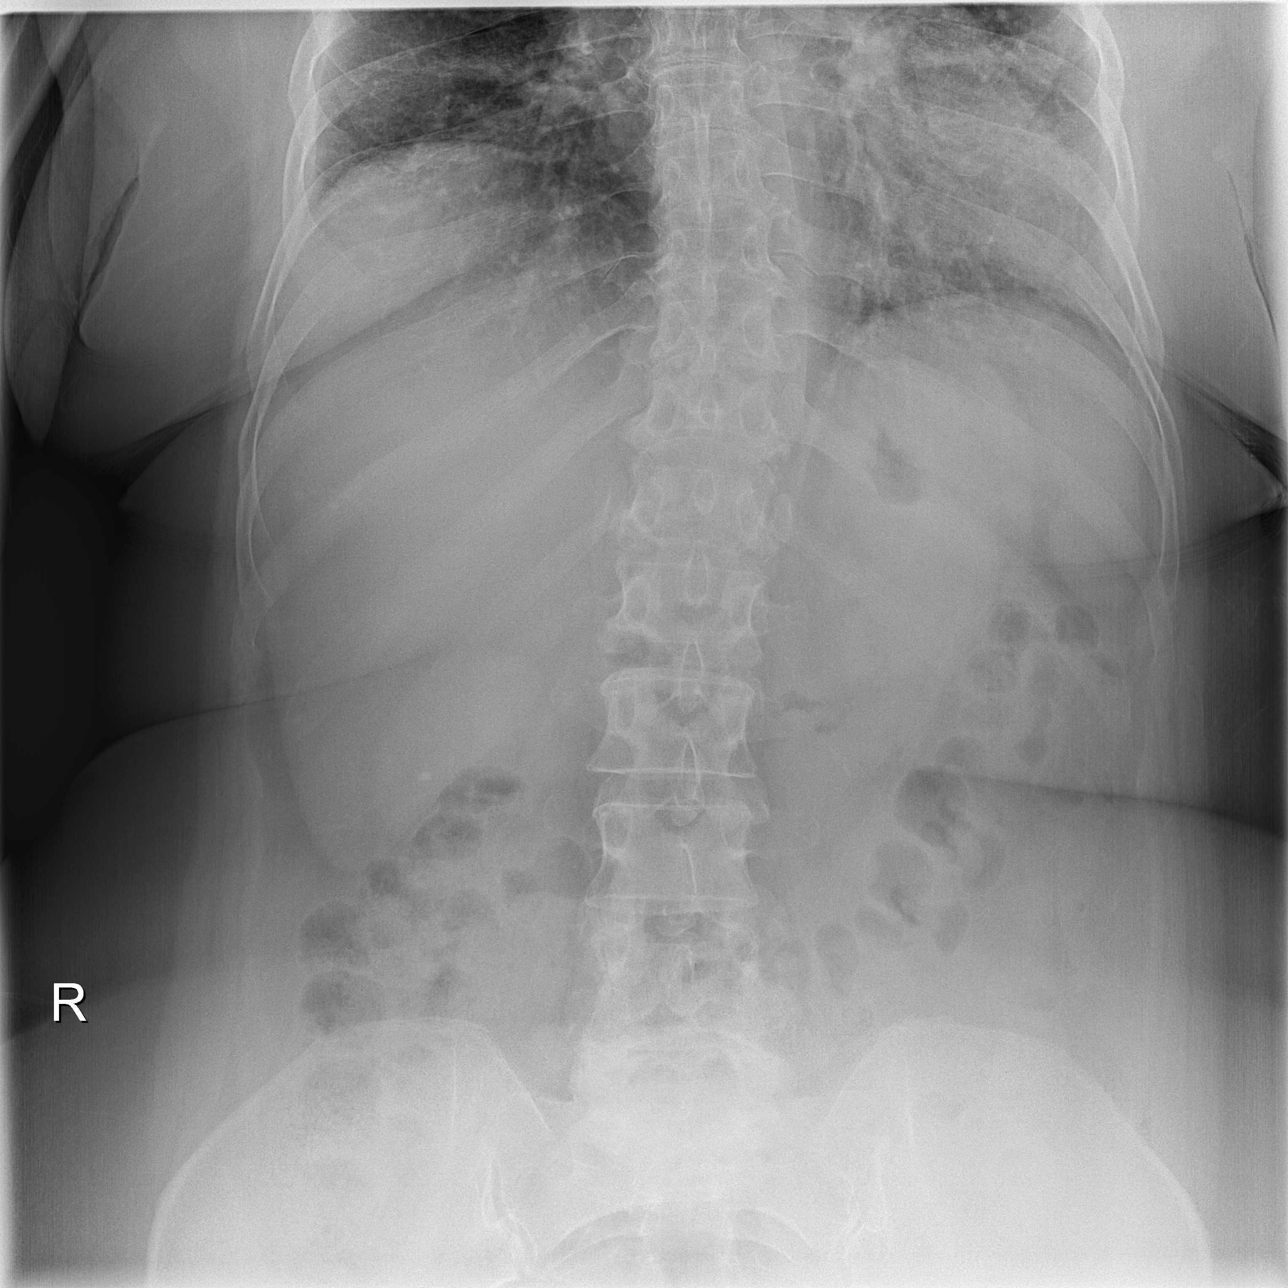

[3 of 3 positions shown; findings below may reference images not displayed]

FINDINGS: The cardiomediastinal silhouette is unremarkable.

Mild elevation of the right hemidiaphragm again noted.

There is no evidence of airspace disease, pleural effusion or
pneumothorax.

The bowel gas pattern is unremarkable.

There is no evidence of bowel obstruction or pneumoperitoneum.

No suspicious calcifications are identified.

No acute bony abnormalities noted.
IMPRESSION: No acute abnormalities.  Normal bowel gas pattern.

## 2016-03-04 IMAGING — CT CT STONE STUDY
2 of 3 series · 17 of 46 positions shown, 19 images · non-contrast
Comparison: 04/23/2007

CLINICAL DATA: Left flank pain.

EXAM:
CT ABDOMEN AND PELVIS WITHOUT CONTRAST
TECHNIQUE: Multidetector CT imaging of the abdomen and pelvis was performed
following the standard protocol without IV contrast.

[Series 2: stone standard full · axial · 0.78mm/px · z∈[-462,-66]mm · 14 of 91 slices shown, 16 images]
[im 6/91  soft-tissue]
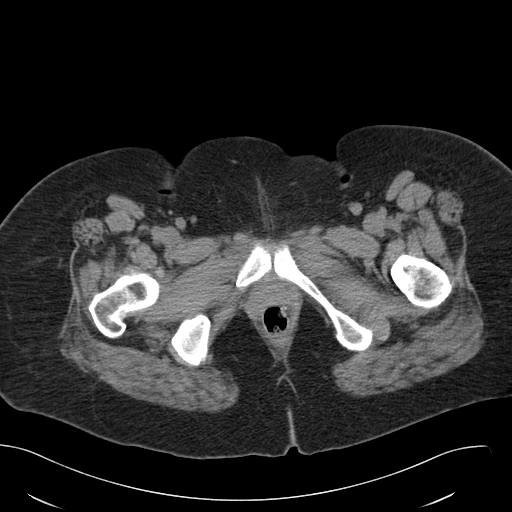
[im 6/91  bone]
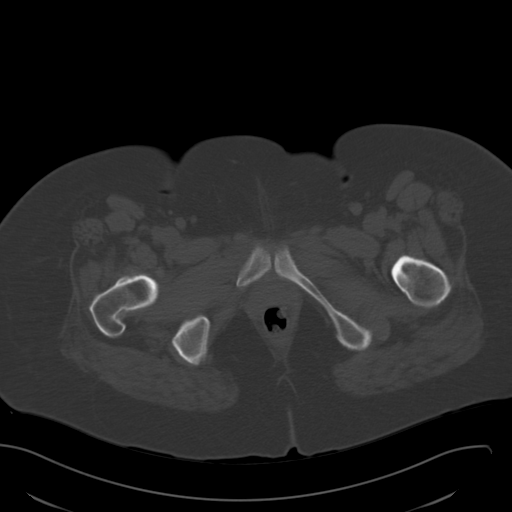
[im 12/91  soft-tissue]
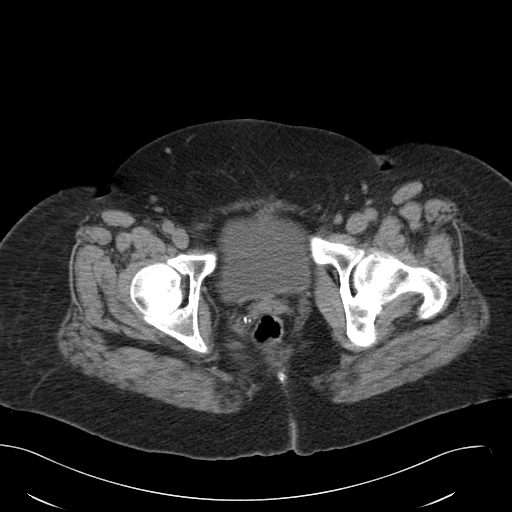
[im 18/91  soft-tissue]
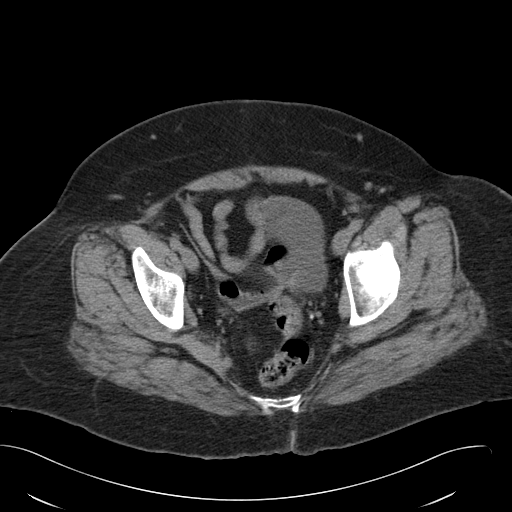
[im 24/91  soft-tissue]
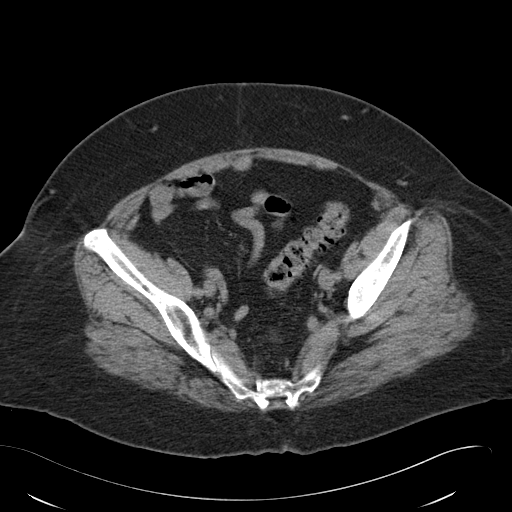
[im 30/91  soft-tissue]
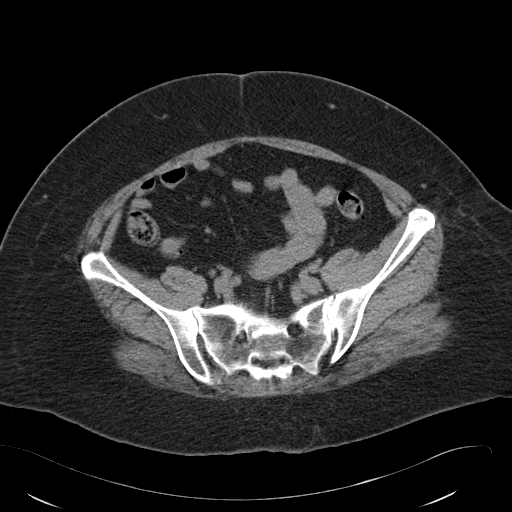
[im 35/91  soft-tissue]
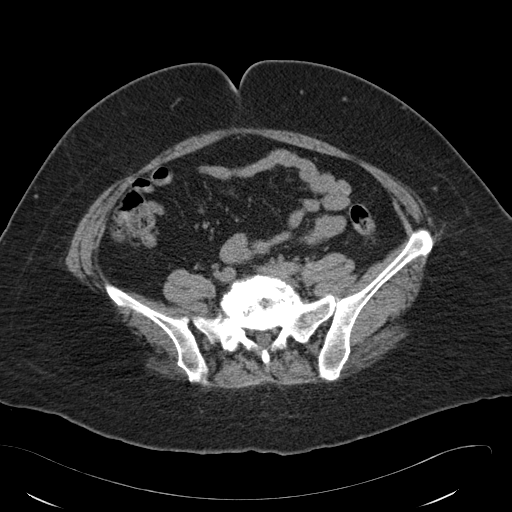
[im 41/91  soft-tissue]
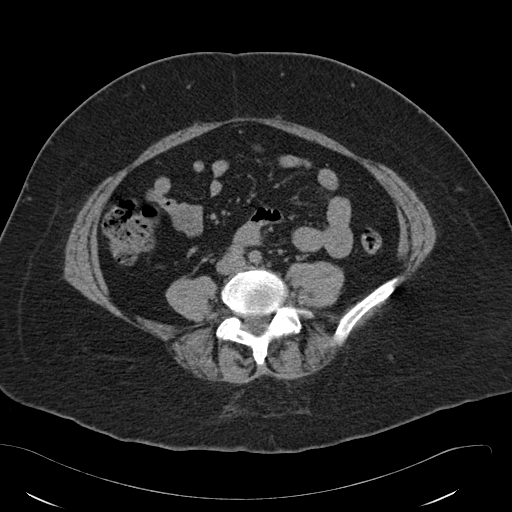
[im 50/91  soft-tissue]
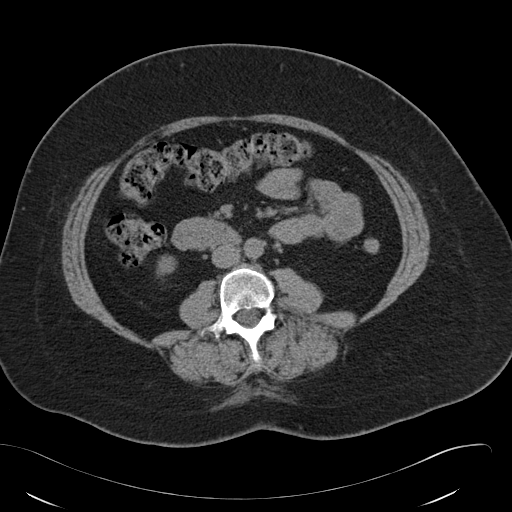
[im 56/91  soft-tissue]
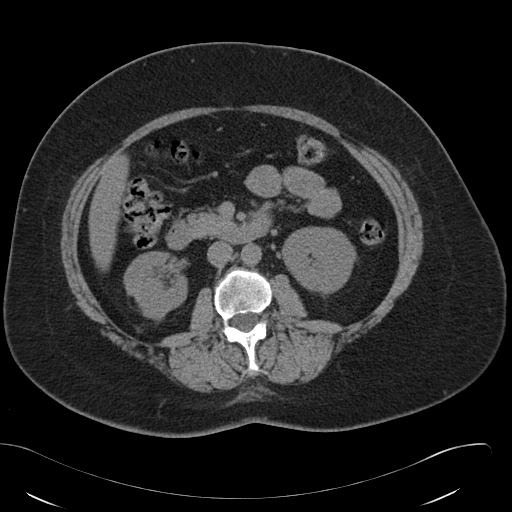
[im 56/91  bone]
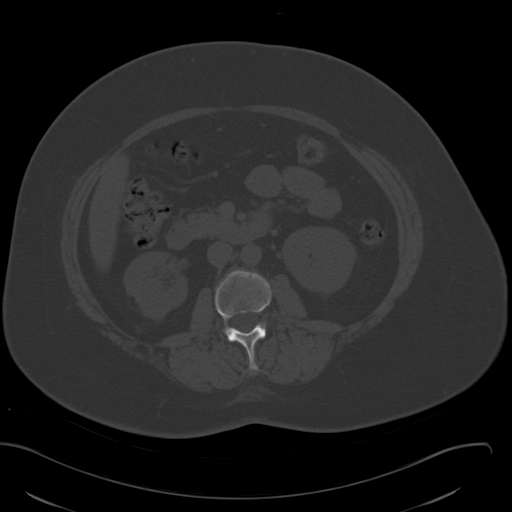
[im 61/91  soft-tissue]
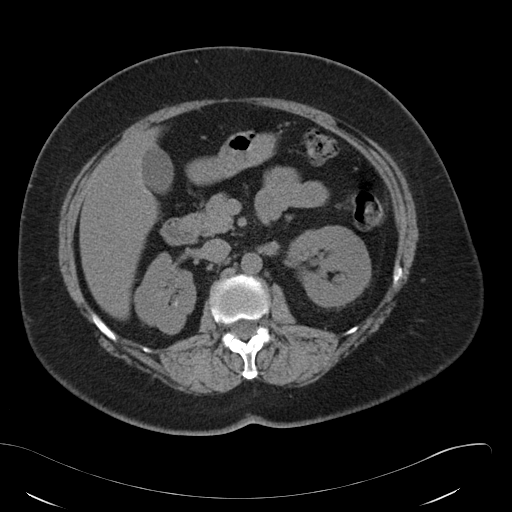
[im 67/91  soft-tissue]
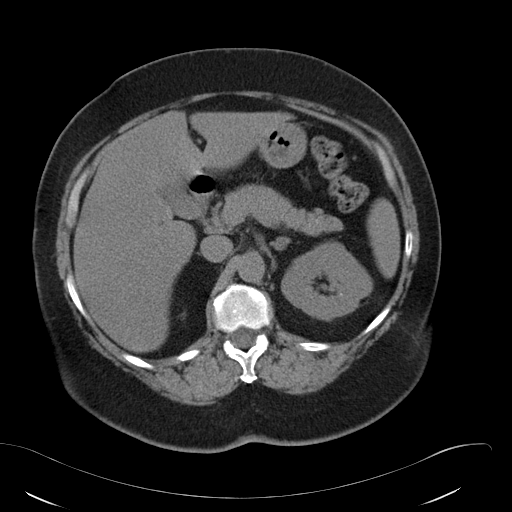
[im 73/91  soft-tissue]
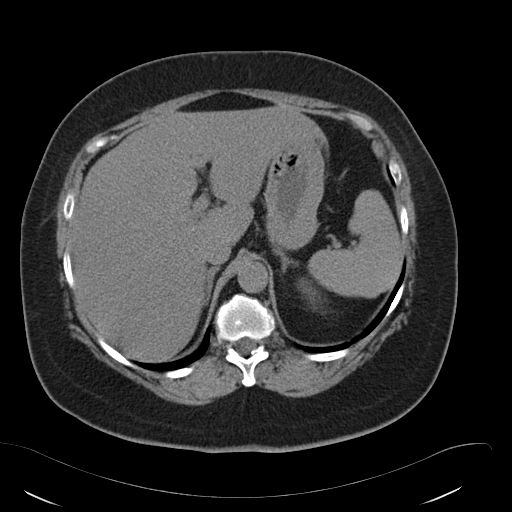
[im 79/91  soft-tissue]
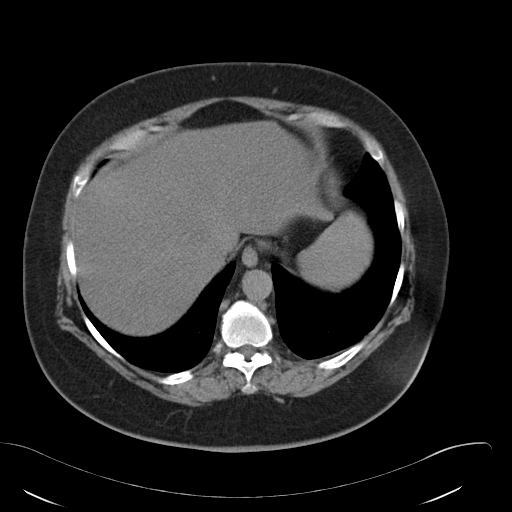
[im 85/91  soft-tissue]
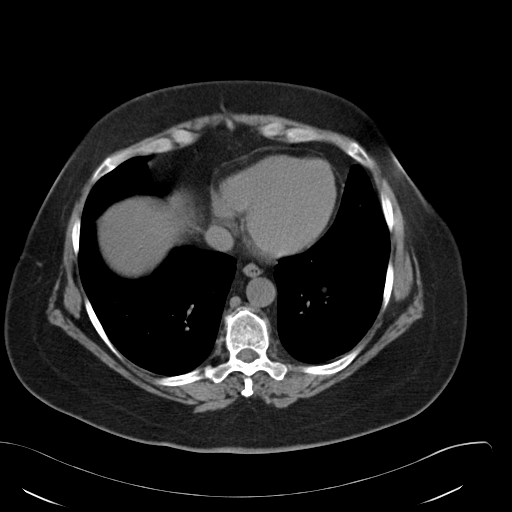

[Series 5: cor stone standard full · coronal · 0.84mm/px · 3 of 161 slices shown]
[im 54/161  soft-tissue]
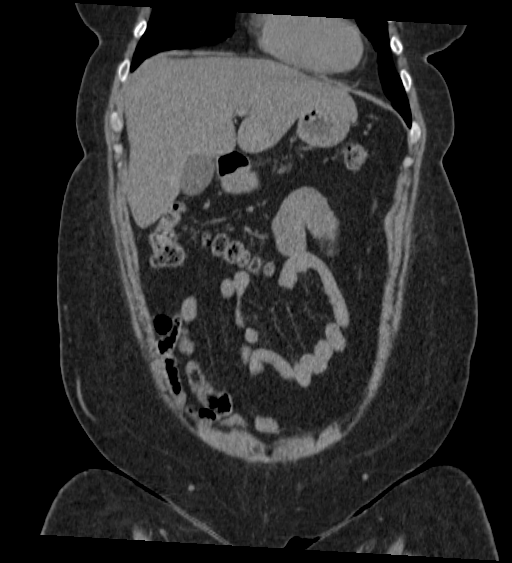
[im 72/161  soft-tissue]
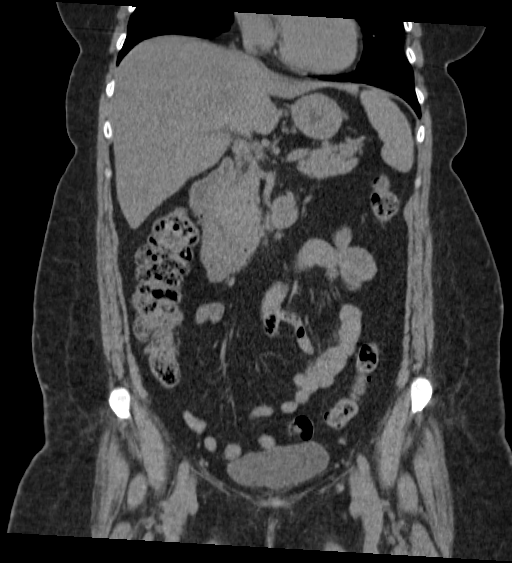
[im 89/161  soft-tissue]
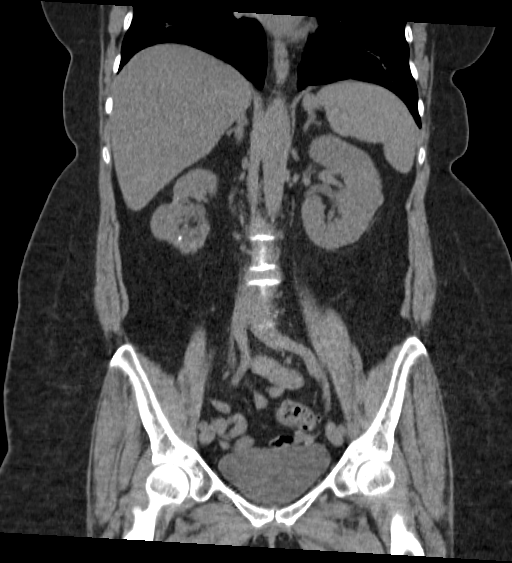

[17 of 46 positions shown; findings below may reference images not displayed]

FINDINGS: Lung bases are clear.  No effusions.  Heart is normal size.

Diffuse fatty infiltration of the liver. Gallbladder, spleen,
pancreas, adrenals have an unremarkable unenhanced appearance.

Areas of cortical scarring and parenchymal calcifications in the
right kidney are stable since prior study. Similar but less
pronounced findings also seen in the left kidney. No hydronephrosis.
No ureteral stones. Urinary bladder is unremarkable.

Prior hysterectomy. No adnexal masses. Appendix is visualized and is
normal. Stomach, large and small bowel are unremarkable. Aorta is
normal caliber.

No acute bony abnormality.  Degenerative disc disease at L5-S1.
IMPRESSION: Areas of cortical scarring and parenchymal calcifications in the
kidneys bilaterally, right greater than left. This is stable since
5991. No ureteral stones or hydronephrosis.

Fatty infiltration of the liver.

## 2016-03-29 ENCOUNTER — Ambulatory Visit: Payer: Self-pay | Admitting: Urology

## 2016-03-29 ENCOUNTER — Encounter: Payer: Self-pay | Admitting: Urology

## 2016-03-29 IMAGING — CT CT ABD-PELV W/ CM
2 of 5 series · 17 of 46 positions shown, 19 images · IV contrast (isovue)
Comparison: CT 02/02/2014 lung bases are clear. No pericardial
fluid.

CLINICAL DATA: Abdominal pain and nausea and vomiting

EXAM:
CT ABDOMEN AND PELVIS WITH CONTRAST
TECHNIQUE: Multidetector CT imaging of the abdomen and pelvis was performed
using the standard protocol following bolus administration of
intravenous contrast. Patient reported IV reaction. Patient was
administered Benadryl (and 25 mg) . No adverse reaction all
CONTRAST:  100 mL Isovue..

[Series 2: routine abd pel with · axial · 0.87mm/px · z∈[-546,-96]mm · 14 of 100 slices shown, 16 images]
[im 5/100  soft-tissue]
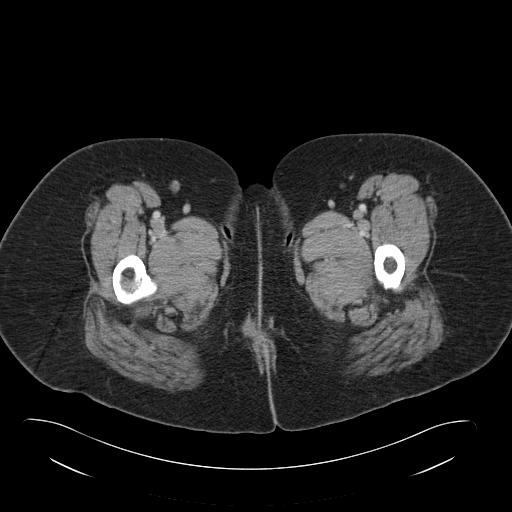
[im 5/100  bone]
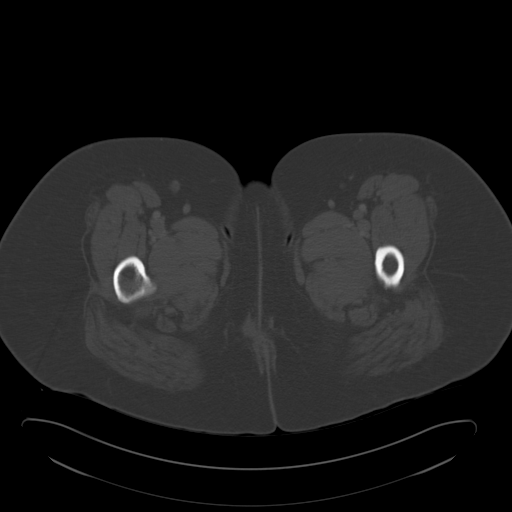
[im 15/100  soft-tissue]
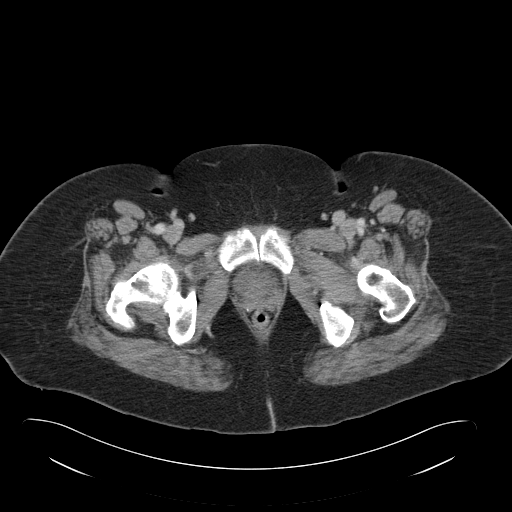
[im 20/100  soft-tissue]
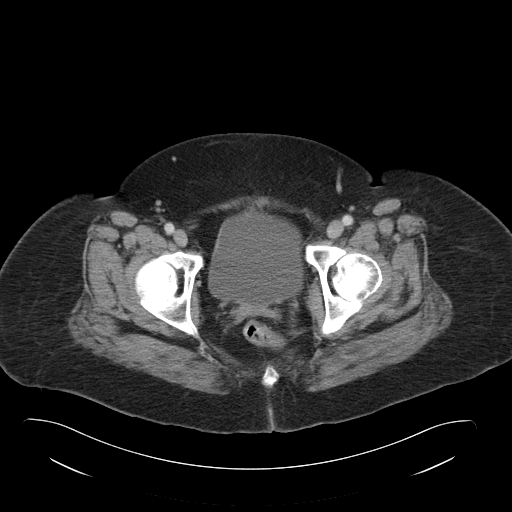
[im 25/100  soft-tissue]
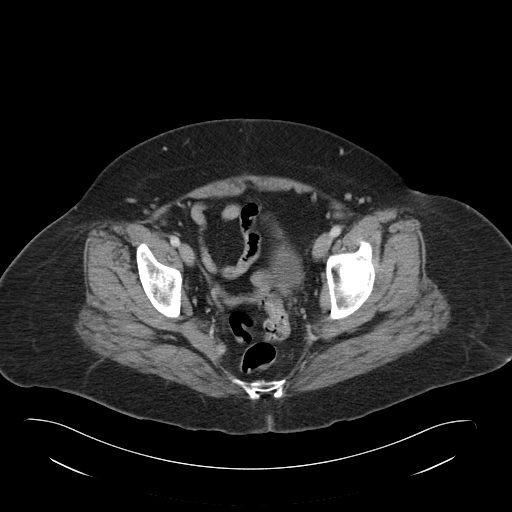
[im 35/100  soft-tissue]
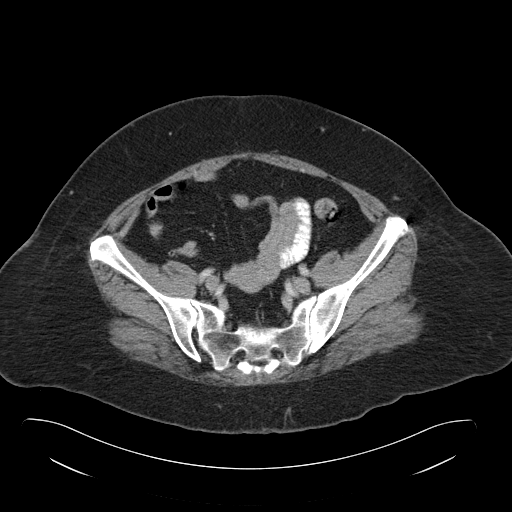
[im 40/100  soft-tissue]
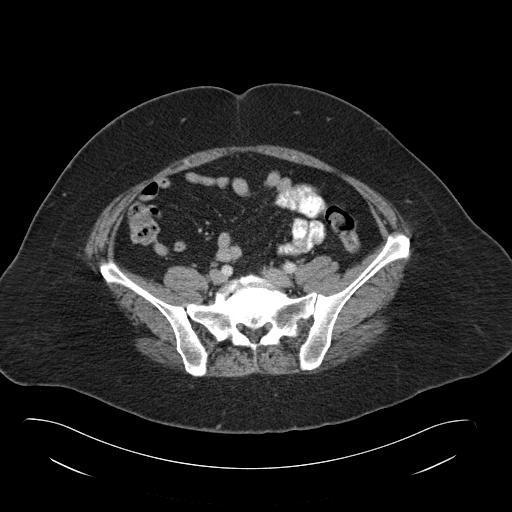
[im 45/100  soft-tissue]
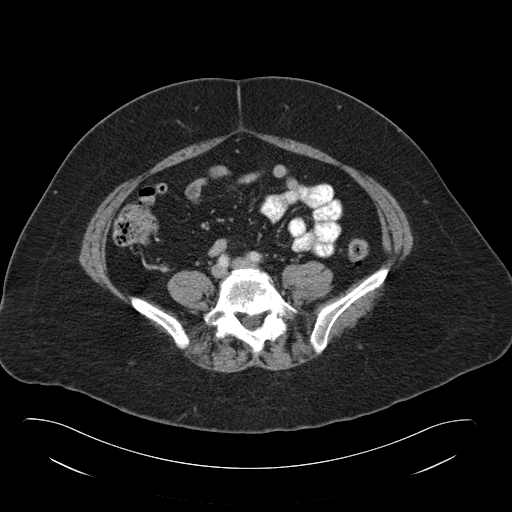
[im 55/100  soft-tissue]
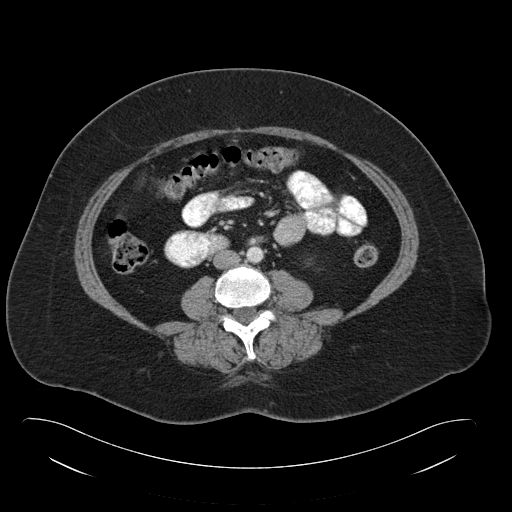
[im 60/100  soft-tissue]
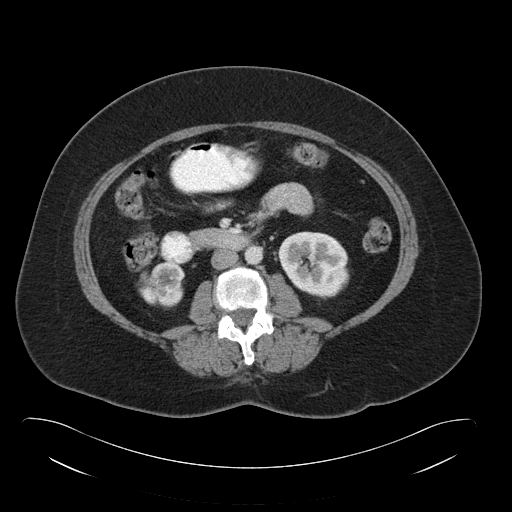
[im 60/100  bone]
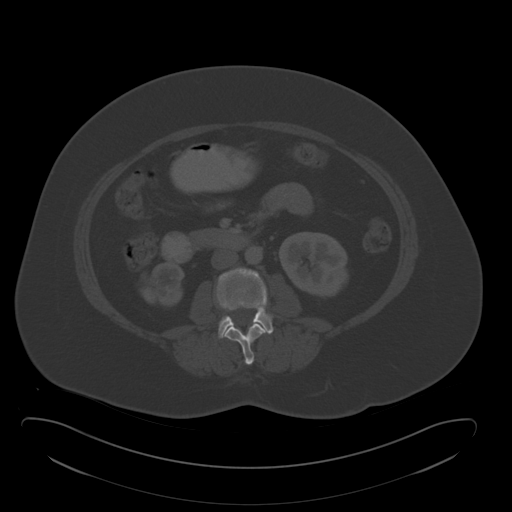
[im 65/100  soft-tissue]
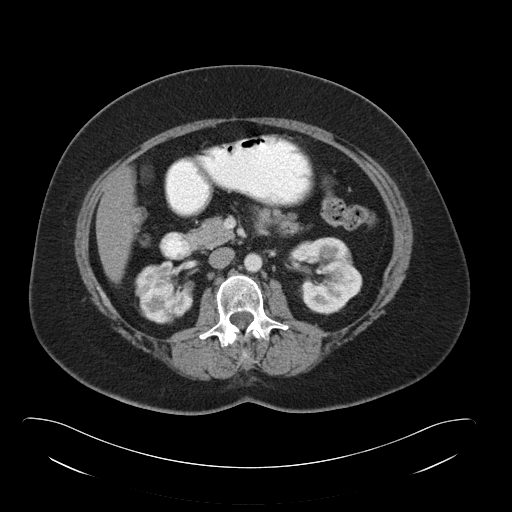
[im 75/100  soft-tissue]
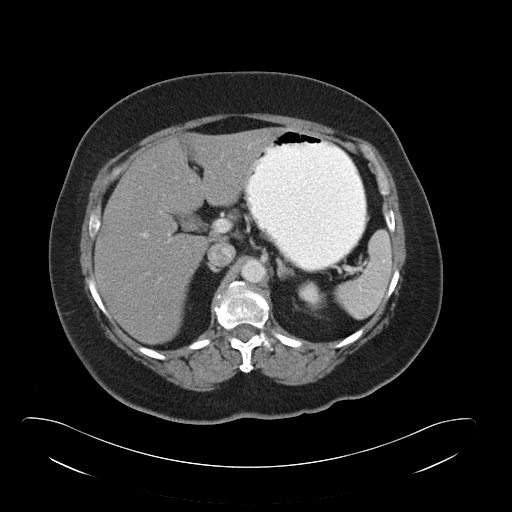
[im 80/100  soft-tissue]
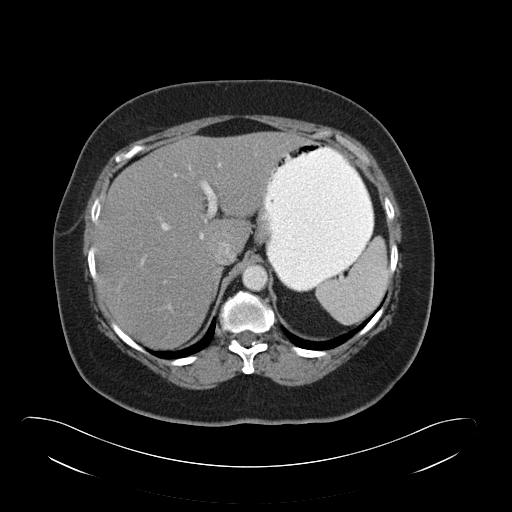
[im 85/100  soft-tissue]
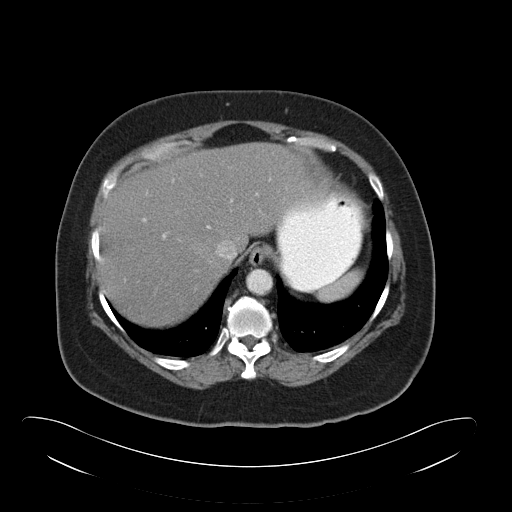
[im 95/100  soft-tissue]
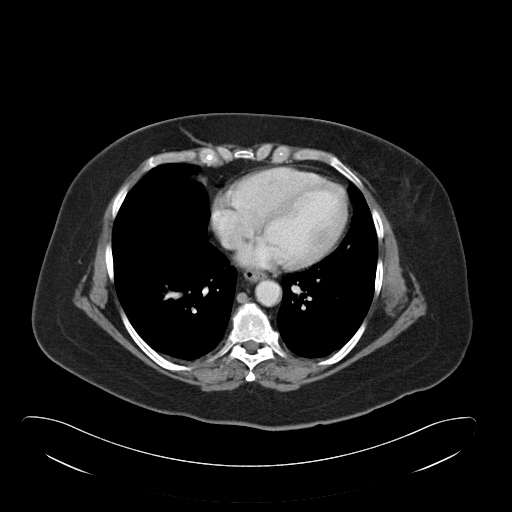

[Series 5: cor routine abd pel with · coronal · 0.89mm/px · 3 of 126 slices shown]
[im 42/126  soft-tissue]
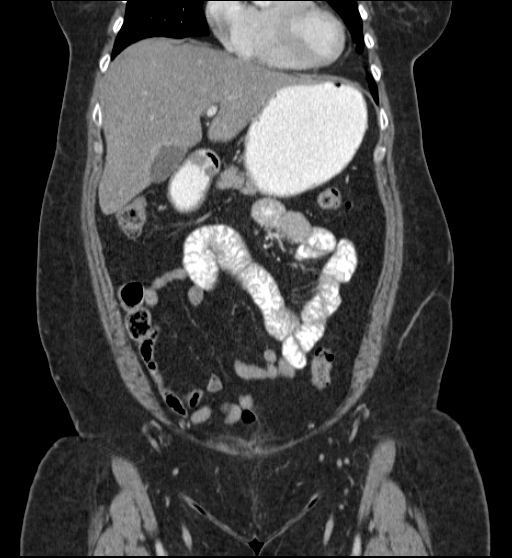
[im 56/126  soft-tissue]
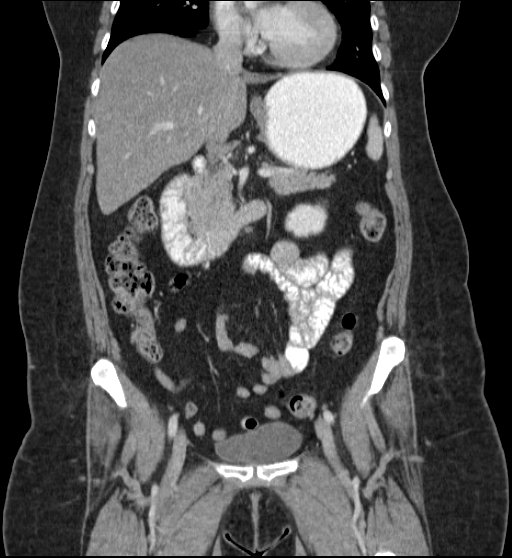
[im 70/126  soft-tissue]
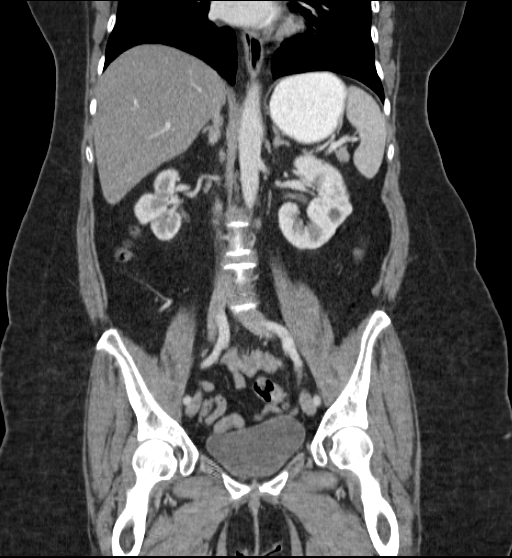

[17 of 46 positions shown; findings below may reference images not displayed]

FINDINGS: Lung bases are clear. No pericardial fluid. No focal hepatic lesion.
Gallbladder, pancreas, spleen, and adrenal glands, and kidneys are
normal. There is cortical scarring of the kidneys, greater on the
right. There is peripheral calcification on the right which appears
dystrophic. There is no hydronephrosis.

There is a nodule of the left adrenal gland which is small and low
attenuation on comparison exam consists with benign adenoma.

The stomach, small bowel, appendix, cecum normal. The colon and
rectosigmoid colon are normal.

Abdominal or is normal caliber. No retroperitoneal periportal
lymphadenopathy.

No free fluid the pelvis. The bladder is normal. Post hysterectomy
anatomy. Note inguinal hernia abdominal hernia. No aggressive
osseous lesion.
IMPRESSION: 1. Right renal cortical scarring.
2. Normal appendix.
3. Normal gallbladder.
4. No evidence of bowel obstruction.

## 2016-04-12 DIAGNOSIS — G5603 Carpal tunnel syndrome, bilateral upper limbs: Secondary | ICD-10-CM | POA: Insufficient documentation

## 2016-04-18 ENCOUNTER — Inpatient Hospital Stay (HOSPITAL_COMMUNITY): Payer: 59

## 2016-04-18 ENCOUNTER — Emergency Department: Payer: 59

## 2016-04-18 ENCOUNTER — Emergency Department
Admission: EM | Admit: 2016-04-18 | Discharge: 2016-04-18 | Payer: 59 | Attending: Emergency Medicine | Admitting: Emergency Medicine

## 2016-04-18 ENCOUNTER — Inpatient Hospital Stay (HOSPITAL_COMMUNITY)
Admission: AD | Admit: 2016-04-18 | Discharge: 2016-04-30 | DRG: 064 | Disposition: A | Discharge status: Home Health | Payer: 59 | Source: Other Acute Inpatient Hospital | Attending: Neurology | Admitting: Neurology

## 2016-04-18 ENCOUNTER — Encounter: Payer: Self-pay | Admitting: Emergency Medicine

## 2016-04-18 DIAGNOSIS — I612 Nontraumatic intracerebral hemorrhage in hemisphere, unspecified: Secondary | ICD-10-CM | POA: Diagnosis not present

## 2016-04-18 DIAGNOSIS — G08 Intracranial and intraspinal phlebitis and thrombophlebitis: Secondary | ICD-10-CM | POA: Diagnosis present

## 2016-04-18 DIAGNOSIS — Z6836 Body mass index (BMI) 36.0-36.9, adult: Secondary | ICD-10-CM

## 2016-04-18 DIAGNOSIS — E669 Obesity, unspecified: Secondary | ICD-10-CM | POA: Diagnosis present

## 2016-04-18 DIAGNOSIS — I676 Nonpyogenic thrombosis of intracranial venous system: Secondary | ICD-10-CM | POA: Diagnosis not present

## 2016-04-18 DIAGNOSIS — I110 Hypertensive heart disease with heart failure: Secondary | ICD-10-CM | POA: Diagnosis present

## 2016-04-18 DIAGNOSIS — Z7984 Long term (current) use of oral hypoglycemic drugs: Secondary | ICD-10-CM | POA: Diagnosis not present

## 2016-04-18 DIAGNOSIS — G8321 Monoplegia of upper limb affecting right dominant side: Secondary | ICD-10-CM | POA: Diagnosis present

## 2016-04-18 DIAGNOSIS — B1920 Unspecified viral hepatitis C without hepatic coma: Secondary | ICD-10-CM | POA: Diagnosis present

## 2016-04-18 DIAGNOSIS — I611 Nontraumatic intracerebral hemorrhage in hemisphere, cortical: Secondary | ICD-10-CM | POA: Diagnosis present

## 2016-04-18 DIAGNOSIS — Z91041 Radiographic dye allergy status: Secondary | ICD-10-CM

## 2016-04-18 DIAGNOSIS — Z886 Allergy status to analgesic agent status: Secondary | ICD-10-CM

## 2016-04-18 DIAGNOSIS — R278 Other lack of coordination: Secondary | ICD-10-CM | POA: Diagnosis present

## 2016-04-18 DIAGNOSIS — I10 Essential (primary) hypertension: Secondary | ICD-10-CM

## 2016-04-18 DIAGNOSIS — Z880 Allergy status to penicillin: Secondary | ICD-10-CM

## 2016-04-18 DIAGNOSIS — Z8249 Family history of ischemic heart disease and other diseases of the circulatory system: Secondary | ICD-10-CM

## 2016-04-18 DIAGNOSIS — I161 Hypertensive emergency: Secondary | ICD-10-CM | POA: Diagnosis present

## 2016-04-18 DIAGNOSIS — F17210 Nicotine dependence, cigarettes, uncomplicated: Secondary | ICD-10-CM | POA: Insufficient documentation

## 2016-04-18 DIAGNOSIS — I610 Nontraumatic intracerebral hemorrhage in hemisphere, subcortical: Secondary | ICD-10-CM | POA: Diagnosis not present

## 2016-04-18 DIAGNOSIS — G9341 Metabolic encephalopathy: Secondary | ICD-10-CM | POA: Diagnosis present

## 2016-04-18 DIAGNOSIS — Z8673 Personal history of transient ischemic attack (TIA), and cerebral infarction without residual deficits: Secondary | ICD-10-CM

## 2016-04-18 DIAGNOSIS — R4182 Altered mental status, unspecified: Secondary | ICD-10-CM

## 2016-04-18 DIAGNOSIS — R9401 Abnormal electroencephalogram [EEG]: Secondary | ICD-10-CM | POA: Diagnosis present

## 2016-04-18 DIAGNOSIS — G936 Cerebral edema: Secondary | ICD-10-CM | POA: Diagnosis present

## 2016-04-18 DIAGNOSIS — Z90710 Acquired absence of both cervix and uterus: Secondary | ICD-10-CM | POA: Diagnosis not present

## 2016-04-18 DIAGNOSIS — H5700 Unspecified anomaly of pupillary function: Secondary | ICD-10-CM

## 2016-04-18 DIAGNOSIS — Z885 Allergy status to narcotic agent status: Secondary | ICD-10-CM

## 2016-04-18 DIAGNOSIS — E119 Type 2 diabetes mellitus without complications: Secondary | ICD-10-CM | POA: Diagnosis present

## 2016-04-18 DIAGNOSIS — I615 Nontraumatic intracerebral hemorrhage, intraventricular: Secondary | ICD-10-CM | POA: Diagnosis present

## 2016-04-18 DIAGNOSIS — I6789 Other cerebrovascular disease: Secondary | ICD-10-CM | POA: Diagnosis not present

## 2016-04-18 DIAGNOSIS — I619 Nontraumatic intracerebral hemorrhage, unspecified: Secondary | ICD-10-CM | POA: Insufficient documentation

## 2016-04-18 DIAGNOSIS — Z79899 Other long term (current) drug therapy: Secondary | ICD-10-CM | POA: Diagnosis not present

## 2016-04-18 DIAGNOSIS — F802 Mixed receptive-expressive language disorder: Secondary | ICD-10-CM | POA: Diagnosis present

## 2016-04-18 DIAGNOSIS — I509 Heart failure, unspecified: Secondary | ICD-10-CM | POA: Diagnosis present

## 2016-04-18 DIAGNOSIS — R58 Hemorrhage, not elsewhere classified: Secondary | ICD-10-CM

## 2016-04-18 DIAGNOSIS — E876 Hypokalemia: Secondary | ICD-10-CM | POA: Diagnosis present

## 2016-04-18 LAB — CBC WITH DIFFERENTIAL/PLATELET
BASOS ABS: 0 10*3/uL (ref 0–0.1)
Basophils Relative: 1 %
Eosinophils Absolute: 0.1 10*3/uL (ref 0–0.7)
Eosinophils Relative: 1 %
HEMATOCRIT: 44.1 % (ref 35.0–47.0)
Hemoglobin: 15.2 g/dL (ref 12.0–16.0)
LYMPHS ABS: 2.5 10*3/uL (ref 1.0–3.6)
LYMPHS PCT: 29 %
MCH: 32.1 pg (ref 26.0–34.0)
MCHC: 34.6 g/dL (ref 32.0–36.0)
MCV: 92.7 fL (ref 80.0–100.0)
MONO ABS: 0.7 10*3/uL (ref 0.2–0.9)
Monocytes Relative: 8 %
NEUTROS ABS: 5.4 10*3/uL (ref 1.4–6.5)
Neutrophils Relative %: 61 %
Platelets: 192 10*3/uL (ref 150–440)
RBC: 4.75 MIL/uL (ref 3.80–5.20)
RDW: 13.6 % (ref 11.5–14.5)
WBC: 8.8 10*3/uL (ref 3.6–11.0)

## 2016-04-18 LAB — URINALYSIS COMPLETE WITH MICROSCOPIC (ARMC ONLY)
BILIRUBIN URINE: NEGATIVE
NITRITE: NEGATIVE
PH: 7 (ref 5.0–8.0)
Protein, ur: 30 mg/dL — AB
Specific Gravity, Urine: 1.008 (ref 1.005–1.030)

## 2016-04-18 LAB — COMPREHENSIVE METABOLIC PANEL
ALT: 45 U/L (ref 14–54)
AST: 43 U/L — AB (ref 15–41)
Albumin: 4.4 g/dL (ref 3.5–5.0)
Alkaline Phosphatase: 94 U/L (ref 38–126)
Anion gap: 11 (ref 5–15)
BILIRUBIN TOTAL: 1 mg/dL (ref 0.3–1.2)
BUN: 10 mg/dL (ref 6–20)
CHLORIDE: 103 mmol/L (ref 101–111)
CO2: 24 mmol/L (ref 22–32)
Calcium: 9.4 mg/dL (ref 8.9–10.3)
Creatinine, Ser: 0.54 mg/dL (ref 0.44–1.00)
Glucose, Bld: 191 mg/dL — ABNORMAL HIGH (ref 65–99)
POTASSIUM: 3.5 mmol/L (ref 3.5–5.1)
Sodium: 138 mmol/L (ref 135–145)
TOTAL PROTEIN: 7.9 g/dL (ref 6.5–8.1)

## 2016-04-18 LAB — TSH: TSH: 1.471 u[IU]/mL (ref 0.350–4.500)

## 2016-04-18 LAB — T4, FREE: FREE T4: 0.92 ng/dL (ref 0.61–1.12)

## 2016-04-18 LAB — LACTIC ACID, PLASMA
LACTIC ACID, VENOUS: 1 mmol/L (ref 0.5–1.9)
Lactic Acid, Venous: 2.3 mmol/L (ref 0.5–1.9)

## 2016-04-18 LAB — TROPONIN I

## 2016-04-18 LAB — ETHANOL: Alcohol, Ethyl (B): <5 mg/dL (ref <5)

## 2016-04-18 LAB — MRSA PCR SCREENING: MRSA BY PCR: NEGATIVE

## 2016-04-18 LAB — LIPASE, BLOOD: LIPASE: 17 U/L (ref 11–51)

## 2016-04-18 MED ORDER — ONDANSETRON HCL 4 MG/2ML IJ SOLN
INTRAMUSCULAR | Status: AC
  Administered 2016-04-18: 4 mg via INTRAVENOUS
  Filled 2016-04-18: qty 2

## 2016-04-18 MED ORDER — FAMOTIDINE IN NACL 20-0.9 MG/50ML-% IV SOLN
20.0000 mg | Freq: Two times a day (BID) | INTRAVENOUS | Status: DC
  Administered 2016-04-18 – 2016-04-19 (×2): 20 mg via INTRAVENOUS
  Filled 2016-04-18 (×2): qty 50

## 2016-04-18 MED ORDER — ONDANSETRON HCL 4 MG/2ML IJ SOLN
4.0000 mg | Freq: Four times a day (QID) | INTRAMUSCULAR | Status: DC | PRN
  Administered 2016-04-18 – 2016-04-24 (×3): 4 mg via INTRAVENOUS
  Filled 2016-04-18 (×3): qty 2

## 2016-04-18 MED ORDER — STROKE: EARLY STAGES OF RECOVERY BOOK
Freq: Once | Status: AC
  Administered 2016-04-18: 20:00:00
  Filled 2016-04-18: qty 1

## 2016-04-18 MED ORDER — SODIUM CHLORIDE 0.9 % IV BOLUS (SEPSIS)
1000.0000 mL | Freq: Once | INTRAVENOUS | Status: AC
  Administered 2016-04-18: 1000 mL via INTRAVENOUS

## 2016-04-18 MED ORDER — HYDROMORPHONE HCL 1 MG/ML IJ SOLN
2.0000 mg | INTRAMUSCULAR | Status: DC | PRN
  Administered 2016-04-18 – 2016-04-24 (×13): 2 mg via INTRAVENOUS
  Filled 2016-04-18 (×13): qty 2

## 2016-04-18 MED ORDER — ONDANSETRON HCL 4 MG/2ML IJ SOLN
4.0000 mg | Freq: Once | INTRAMUSCULAR | Status: AC
  Administered 2016-04-18: 4 mg via INTRAVENOUS

## 2016-04-18 MED ORDER — NICARDIPINE HCL IN NACL 20-0.86 MG/200ML-% IV SOLN
3.0000 mg/h | INTRAVENOUS | Status: DC
  Administered 2016-04-18: 7.5 mg/h via INTRAVENOUS
  Administered 2016-04-19 (×2): 5 mg/h via INTRAVENOUS
  Administered 2016-04-19: 3 mg/h via INTRAVENOUS
  Administered 2016-04-19 – 2016-04-20 (×2): 5 mg/h via INTRAVENOUS
  Administered 2016-04-20 – 2016-04-21 (×2): 3 mg/h via INTRAVENOUS
  Filled 2016-04-18 (×8): qty 200

## 2016-04-18 MED ORDER — VANCOMYCIN HCL IN DEXTROSE 1-5 GM/200ML-% IV SOLN
1000.0000 mg | Freq: Once | INTRAVENOUS | Status: AC
Start: 2016-04-18 — End: 2016-04-18
  Administered 2016-04-18: 1000 mg via INTRAVENOUS
  Filled 2016-04-18: qty 200

## 2016-04-18 MED ORDER — SODIUM CHLORIDE 0.9 % IV BOLUS (SEPSIS): 904.0000 mL | Freq: Once | INTRAVENOUS | Status: DC

## 2016-04-18 MED ORDER — HYDRALAZINE HCL 20 MG/ML IJ SOLN
10.0000 mg | Freq: Once | INTRAMUSCULAR | Status: AC
  Administered 2016-04-18: 10 mg via INTRAVENOUS
  Filled 2016-04-18: qty 1

## 2016-04-18 MED ORDER — NICARDIPINE HCL IN NACL 20-0.86 MG/200ML-% IV SOLN
3.0000 mg/h | INTRAVENOUS | Status: DC
  Administered 2016-04-18 (×2): 5 mg/h via INTRAVENOUS
  Filled 2016-04-18 (×2): qty 200

## 2016-04-18 MED ORDER — DEXTROSE 5 % IV SOLN
1.0000 g | Freq: Once | INTRAVENOUS | Status: AC
  Administered 2016-04-18: 1 g via INTRAVENOUS
  Filled 2016-04-18: qty 1

## 2016-04-18 NOTE — ED Provider Notes (Addendum)
-----------------------------------------   4:38 PM on 04/18/2016 -----------------------------------------  Signed out to me pending answer with a known or parenchymal head bleed. Blood pressure is been well controlled here watching closely on a Cardene drip. Patient is accepted to neurosurgery unit. She is no guarding her airway and awake. She is mildly confused. She sometimes has some stuttering speech. This appears to be about consistent with what she has been like. We will hopefully expedite transfer to definitive care center. We'll watch her closely.   Jeanmarie PlantJames A Tiffiany Beadles, MD 04/18/16 [id number]   ----------------------------------------- 5:45 PM on 04/18/2016 ----------------------------------------- Patient complaining of some abdominal pain. She has a normal abdominal exam at this time. No evidence of AAA. Blood pressure well controlled. Her complaints are that she is very hungry and also that her stomach hurts. I do not think it is safe to send her for CT scan with a reassuring exam and new-onset abdominal pain the context of a head bleed. We will treat her with nausea medication. Patient does have multiple different chronic pain complaints and I do not think that they are necessarily related to her symptoms today.   Jeanmarie PlantJames A Mckinnon Glick, MD 04/18/16 [phone]

## 2016-04-18 NOTE — ED Notes (Signed)
Pt complains of nausea and requests medication to treat it. EDP made aware and orders  zofran.

## 2016-04-18 NOTE — ED Notes (Signed)
Pt placed back under the bair hugger. Pt stated understanding of why she needs it. MD notified of patient continuing to have decreased temp.

## 2016-04-18 NOTE — ED Notes (Signed)
Patient taken off bear hugger with MD approval due to temp 98.2

## 2016-04-18 NOTE — ED Notes (Signed)
Pt returned from CT. Bair hugger applied at this time, more warm blankets applied at this time. Fluids stopped per MD order.

## 2016-04-18 NOTE — Consult Note (Signed)
Reason for Consult: Occipital hemorrhage, intraventricular  hemorrhage Referring Physician: Dr. Gillermina Hu is an 50 y.o. female.  HPI: The patient is a 50 year old obese white female with a history of hypertension who presented to the [facility] today with mental status changes. She was worked up with a head CT which demonstrated a left intracerebral hemorrhage. The patient was transferred to [facility] for further care. A neurosurgical consultation has been requested. The patient was significantly hypertensive and a nicardipine drip was started for blood pressure control.  By report in route to [facility] the patient had some pupillary abnormalities. She was worked up with a repeat head CT which was stable.  Presently the patient is alert and pleasant. She is dysphasic/dysarthric. She is in no apparent distress.  Past Medical History:  Diagnosis Date  . Diverticulitis   . Hepatitis C   . Hypertension   . Kidney stones   . Sciatica     Past Surgical History:  Procedure Laterality Date  . ABDOMINAL HYSTERECTOMY    . KIDNEY STONE SURGERY Right 02/12/12  . KIDNEY STONE SURGERY Left 07/15/2012  . LITHOTRIPSY    . OOPHORECTOMY    . SINUS EXPLORATION      Family History  Problem Relation Age of Onset  . Heart failure Mother   . Diabetes Father   . Cancer Sister     Social History:  reports that she has been smoking Cigarettes.  She has a 0.37 pack-year smoking history. She has never used smokeless tobacco. She reports that she does not drink alcohol or use drugs.  Allergies:  Allergies  Allergen Reactions  . Aspirin Itching  . Codeine Other (See Comments)    Makes her feel like something is crawling under her skin. Can take, sometimes makes her itch  . Compazine Itching    "makes my skin crawl"  . Ioxaglate Itching  . Ivp Dye [Iodinated Diagnostic Agents] Itching    Makes her itch really bad. Had to get 2-3  shots of Benadryl when she was in the hospital.  . Ketorolac Tromethamine Itching  . Metrizamide Itching  . Morphine And Related Itching  . Naproxen Itching  . Norco [Hydrocodone-Acetaminophen] Itching  . Penicillins Itching and Other (See Comments)    Makes her feel really uncomfortable.  . Prochlorperazine Maleate Other (See Comments)    Compazine makes her skin crawl - needs 2-3 shots of Benadryl to get relief.  . Reglan [Metoclopramide] Itching and Other (See Comments)    MAKES HER SKIN CRAWL MAKES HER SKIN CRAWL  . Tegretol [Carbamazepine] Itching and Other (See Comments)    Makes her feel like something is crawling under her skin.  . Toradol [Ketorolac Tromethamine] Itching  . Tramadol Itching    Medications:  I have reviewed the patient's current medications. Prior to Admission:  Prescriptions Prior to Admission  Medication Sig Dispense Refill Last Dose  . alprazolam (XANAX) 2 MG tablet Take 2 mg by mouth 2 (two) times daily as needed for anxiety (anxiety).    09/14/2015 at Unknown time  . Aspirin-Salicylamide-Caffeine (BC HEADACHE POWDER PO) Take 1 packet by mouth daily as needed (headache).   Past Week at Unknown time  . cyclobenzaprine (FLEXERIL) 5 MG tablet Take 7.5 mg by mouth 3 (three) times daily as needed for muscle spasms.    09/14/2015 at Unknown time  . dicyclomine (BENTYL) 20 MG tablet Take 20 mg by mouth 4 (four) times daily.  Past Week at Unknown time  . FLUoxetine (PROZAC) 40 MG capsule Take 40 mg by mouth every morning.    09/14/2015 at Unknown time  . hydrochlorothiazide (HYDRODIURIL) 50 MG tablet Take 50 mg by mouth daily.    09/14/2015 at Unknown time  . losartan (COZAAR) 25 MG tablet Take 50 mg by mouth daily.    09/14/2015 at Unknown time  . omeprazole (PRILOSEC) 40 MG capsule Take 40 mg by mouth.   09/14/2015 at Unknown time  . pregabalin (LYRICA) 100 MG capsule Take 100 mg by mouth 3 (three) times daily.   09/14/2015 at 1130  . promethazine (PHENERGAN) 25 MG tablet  Take 25 mg by mouth every 6 (six) hours as needed for nausea (nausea).    09/14/2015 at Unknown time  . zolpidem (AMBIEN) 10 MG tablet Take 10 mg by mouth at bedtime.    09/13/2015 at Unknown time   Scheduled: .  stroke: mapping our early stages of recovery book   Does not apply Once  . famotidine (PEPCID) IV  20 mg Intravenous Q12H   Continuous:  PRN: Anti-infectives    None       Results for orders placed or performed during the hospital encounter of 04/18/16 (from the past 48 hour(s))  CBC with Differential     Status: None   Collection Time: 04/18/16 12:26 PM  Result Value Ref Range   WBC 8.8 3.6 - 11.0 K/uL   RBC 4.75 3.80 - 5.20 MIL/uL   Hemoglobin 15.2 12.0 - 16.0 g/dL   HCT 44.1 35.0 - 47.0 %   MCV 92.7 80.0 - 100.0 fL   MCH 32.1 26.0 - 34.0 pg   MCHC 34.6 32.0 - 36.0 g/dL   RDW 13.6 11.5 - 14.5 %   Platelets 192 150 - 440 K/uL   Neutrophils Relative % 61 %   Neutro Abs 5.4 1.4 - 6.5 K/uL   Lymphocytes Relative 29 %   Lymphs Abs 2.5 1.0 - 3.6 K/uL   Monocytes Relative 8 %   Monocytes Absolute 0.7 0.2 - 0.9 K/uL   Eosinophils Relative 1 %   Eosinophils Absolute 0.1 0 - 0.7 K/uL   Basophils Relative 1 %   Basophils Absolute 0.0 0 - 0.1 K/uL  Comprehensive metabolic panel     Status: Abnormal   Collection Time: 04/18/16 12:26 PM  Result Value Ref Range   Sodium 138 135 - 145 mmol/L   Potassium 3.5 3.5 - 5.1 mmol/L   Chloride 103 101 - 111 mmol/L   CO2 24 22 - 32 mmol/L   Glucose, Bld 191 (H) 65 - 99 mg/dL   BUN 10 6 - 20 mg/dL   Creatinine, Ser 0.54 0.44 - 1.00 mg/dL   Calcium 9.4 8.9 - 10.3 mg/dL   Total Protein 7.9 6.5 - 8.1 g/dL   Albumin 4.4 3.5 - 5.0 g/dL   AST 43 (H) 15 - 41 U/L   ALT 45 14 - 54 U/L   Alkaline Phosphatase 94 38 - 126 U/L   Total Bilirubin 1.0 0.3 - 1.2 mg/dL   GFR calc non Af Amer >60 >60 mL/min   GFR calc Af Amer >60 >60 mL/min    Comment: (NOTE) The eGFR has been calculated using the CKD EPI equation. This calculation has not been  validated in all clinical situations. eGFR's persistently <60 mL/min signify possible Chronic Kidney Disease.    Anion gap 11 5 - 15  Lipase, blood     Status: None  Collection Time: 04/18/16 12:26 PM  Result Value Ref Range   Lipase 17 11 - 51 U/L  Troponin I     Status: None   Collection Time: 04/18/16 12:26 PM  Result Value Ref Range   Troponin I <0.03 <0.03 ng/mL  Urinalysis complete, with microscopic ([facility] only)     Status: Abnormal   Collection Time: 04/18/16 12:26 PM  Result Value Ref Range   Color, Urine STRAW (A) YELLOW   APPearance CLEAR (A) CLEAR   Glucose, UA >500 (A) NEGATIVE mg/dL   Bilirubin Urine NEGATIVE NEGATIVE   Ketones, ur TRACE (A) NEGATIVE mg/dL   Specific Gravity, Urine 1.008 1.005 - 1.030   Hgb urine dipstick 1+ (A) NEGATIVE   pH 7.0 5.0 - 8.0   Protein, ur 30 (A) NEGATIVE mg/dL   Nitrite NEGATIVE NEGATIVE   Leukocytes, UA TRACE (A) NEGATIVE   RBC / HPF 0-5 0 - 5 RBC/hpf   WBC, UA 6-30 0 - 5 WBC/hpf   Bacteria, UA RARE (A) NONE SEEN   Squamous Epithelial / LPF 0-5 (A) NONE SEEN  TSH     Status: None   Collection Time: 04/18/16 12:26 PM  Result Value Ref Range   TSH 1.471 0.350 - 4.500 uIU/mL  T4, free     Status: None   Collection Time: 04/18/16 12:26 PM  Result Value Ref Range   Free T4 0.92 0.61 - 1.12 ng/dL    Comment: (NOTE) Biotin ingestion may interfere with free T4 tests. If the results are inconsistent with the TSH level, previous test results, or the clinical presentation, then consider biotin interference. If needed, order repeat testing after stopping biotin.   Ethanol     Status: None   Collection Time: 04/18/16 12:26 PM  Result Value Ref Range   Alcohol, Ethyl (B) <5 <5 mg/dL    Comment:        LOWEST DETECTABLE LIMIT FOR SERUM ALCOHOL IS 5 mg/dL FOR MEDICAL PURPOSES ONLY   Lactic acid, plasma     Status: Abnormal   Collection Time: 04/18/16 12:27 PM  Result Value Ref Range   Lactic Acid, Venous 2.3 (HH) 0.5 - 1.9 mmol/L     Comment: CRITICAL RESULT CALLED TO, READ BACK BY AND VERIFIED WITH  MEGAN JONES AT 1330 04/18/16 SDR   Lactic acid, plasma     Status: None   Collection Time: 04/18/16  4:14 PM  Result Value Ref Range   Lactic Acid, Venous 1.0 0.5 - 1.9 mmol/L    Ct Head Wo Contrast  Result Date: 04/18/2016 CLINICAL DATA:  50 year old female -follow-up left intraparenchymal hemorrhage. Possible blown pupil. EXAM: CT HEAD WITHOUT CONTRAST TECHNIQUE: Contiguous axial images were obtained from the base of the skull through the vertex without intravenous contrast. COMPARISON:  04/18/2016 at 2:06 p.m. FINDINGS: A 2.8 x 4.1 cm left parietal intraparenchymal hemorrhage is unchanged with intraventricular extension into the left lateral ventricle. 2 mm left to right midline shift is again noted. Some effacement of the left basilar cisterns again noted. There is no evidence of hydrocephalus, new hemorrhage or other significant change. IMPRESSION: No significant change since the prior study with 2.8 x 4.1 cm left parietal intraparenchymal hemorrhage with intraventricular extension and 2 mm left to right midline shift with some effacement of the left basilar cisterns. Electronically Signed   By: Margarette Canada M.D.   On: 04/18/2016 18:59   Ct Head Wo Contrast  Result Date: 04/18/2016 CLINICAL DATA:  Altered mental status. EXAM: CT  HEAD WITHOUT CONTRAST TECHNIQUE: Contiguous axial images were obtained from the base of the skull through the vertex without intravenous contrast. COMPARISON:  CT scan of July 20, 2015. FINDINGS: Bony calvarium appears intact. Large intraparenchymal hemorrhage is seen in left posterior parietal region measuring 40 x 29 mm. Intraventricular hemorrhage is noted in the left lateral ventricle and a small amount is noted in third ventricle. Small amount of subarachnoid hemorrhage is noted in the left temporal and posterior parietal regions as well. No ventricular enlargement is noted. 4 mm of  left-to-right midline shift is noted. Mild bilateral ethmoid sinusitis is noted. IMPRESSION: Large intraparenchymal hemorrhage is noted in left posterior parietal cortex with extension into the left lateral and third ventricles. Small amount of left-sided subarachnoid hemorrhage is noted. 4 mm of left-to-right midline shift is noted. Critical Value/emergent results were called by telephone at the time of interpretation on 04/18/2016 at 2:22 pm to Dr. Loura Pardon , who verbally acknowledged these results. Electronically Signed   By: Marijo Conception, M.D.   On: 04/18/2016 14:22   Dg Chest Portable 1 View  Result Date: 04/18/2016 CLINICAL DATA:  Altered mental status today. The patient is pale and diaphoretic. EXAM: PORTABLE CHEST 1 VIEW COMPARISON:  Single-view of the chest 08/19/2015 and 11/29/2014. FINDINGS: The lungs are clear. Heart size is upper normal. No pneumothorax or pleural effusion. Aortic atherosclerosis is noted. IMPRESSION: No acute disease. Atherosclerosis. Electronically Signed   By: Inge Rise M.D.   On: 04/18/2016 15:13    ROS: As above Blood pressure (!) 151/101, pulse (!) 111, temperature 98.2 F (36.8 C), temperature source Oral, resp. rate 14, height 5' 3"  (1.6 m), weight 94.4 kg (208 lb 1.8 oz), SpO2 98 %. Physical Exam  General: An alert obese 50 year old white female who is mildly agitated.  HEENT: Normocephalic, atraumatic, pupils are approximately 2-3 mm bilaterally and reactive. Extraocular muscles are intact.  Neck: Supple without obvious deformities  Thorax: Symmetric  Abdomen: Obese and soft  Extremities: The patient has a cast on her right upper extremity and repeat by report has had  surgery this week.  Back exam: Unremarkable  Neurologic exam: The patient is alert and pleasant. Glasgow Coma Scale 13, [id number] She is dysarthric/dysphasic. She notes she was transferred from a hospital in [facility]. The patient's strength is grossly normal in all 4  extremities. Sensory function is grossly normal to light touch in all tested dermatomes bilaterally. Cerebellar function is grossly normal to rapid alternating movements. Cranial nerve exam was somewhat limited but grossly normal. Her pupils are equal as above.  I have reviewed the patient's to head CT scans performed today. They demonstrate a left occipital parenchymal hemorrhage with extension into the ventricular system. There is no significant mass effect or hydrocephalus.  Assessment/Plan: Left occipital hemorrhage, intraventricular hemorrhage, malignant hypertension: Presently the patient does not need surgery or a ventriculostomy. Her blood pressure is being managed with medications. I would recommend planning to repeat her head scan tomorrow or sooner if clinically indicated. I am going out of town tomorrow and I will ask Dr. Kathyrn Sheriff to see the patient in my absence.  Kayde Atkerson D 04/18/2016, 7:20 PM

## 2016-04-18 NOTE — ED Notes (Signed)
Pt placed on bedpan at this time.

## 2016-04-18 NOTE — H&P (Signed)
Initial Neurological Consultation                      NEURO HOSPITALIST ADMIT NOTE      Reason for Admission:  Left parieto-occipital intracranial hemorrhage with intraventricular extension.   HPI:                                                                                                                                          Stephanie [name] is an 50 y.o. female who presented to [facility] with a complaint of diffuse pain as well as headache. She has a known history of hepatitis C, sciatica, recent history of congestive heart failure, and drug seeking behavior. She presented to the emergency room with a complaint of headache and diffuse pain. A CT of the brain was performed and revealed the presence of a large left parieto-occipital hematoma with significant intraventricular extension. She was transferred to [facility] for more definitive care.   Past Medical History:  Diagnosis Date  . Diverticulitis   . Hepatitis C   . Hypertension   . Kidney stones   . Sciatica     Past Surgical History:  Procedure Laterality Date  . ABDOMINAL HYSTERECTOMY    . KIDNEY STONE SURGERY Right 02/12/12  . KIDNEY STONE SURGERY Left 07/15/2012  . LITHOTRIPSY    . OOPHORECTOMY    . SINUS EXPLORATION      MEDICATIONS:                                                                                                                     I have reviewed the patient's current medications.  Allergies  Allergen Reactions  . Aspirin Itching  . Codeine Other (See Comments)    Makes her feel like something is crawling under her skin. Can take, sometimes makes her itch  . Compazine Itching    "makes my skin crawl"  . Ioxaglate Itching  . Ivp Dye [Iodinated Diagnostic Agents] Itching    Makes her itch really bad. Had to get 2-3 shots of Benadryl when she was in the hospital.  . Ketorolac Tromethamine Itching  . Metrizamide Itching  . Morphine And Related Itching  . Naproxen Itching  .  Norco [Hydrocodone-Acetaminophen] Itching  . Penicillins Itching and Other (See Comments)    Makes her feel really uncomfortable.  . Prochlorperazine Maleate Other (See Comments)  Compazine makes her skin crawl - needs 2-3 shots of Benadryl to get relief.  . Reglan [Metoclopramide] Itching and Other (See Comments)    MAKES HER SKIN CRAWL MAKES HER SKIN CRAWL  . Tegretol [Carbamazepine] Itching and Other (See Comments)    Makes her feel like something is crawling under her skin.  . Toradol [Ketorolac Tromethamine] Itching  . Tramadol Itching     Social History:  reports that she has been smoking Cigarettes.  She has a 0.37 pack-year smoking history. She has never used smokeless tobacco. She reports that she does not drink alcohol or use drugs.  Family History  Problem Relation Age of Onset  . Heart failure Mother   . Diabetes Father   . Cancer Sister      ROS:                                                                                                                                       History obtained from chart review  General ROS: negative for - chills, fatigue, fever, night sweats, weight gain or weight loss Psychological ROS: negative for - behavioral disorder, hallucinations, memory difficulties, mood swings or suicidal ideation Ophthalmic ROS: negative for - blurry vision, double vision, eye pain or loss of vision ENT ROS: negative for - epistaxis, nasal discharge, oral lesions, sore throat, tinnitus or vertigo Allergy and Immunology ROS: negative for - hives or itchy/watery eyes Hematological and Lymphatic ROS: negative for - bleeding problems, bruising or swollen lymph nodes Endocrine ROS: negative for - galactorrhea, hair pattern changes, polydipsia/polyuria or temperature intolerance Respiratory ROS: negative for - cough, hemoptysis, shortness of breath or wheezing Cardiovascular ROS: negative for - chest pain, dyspnea on exertion, edema or irregular  heartbeat Gastrointestinal ROS: negative for - abdominal pain, diarrhea, hematemesis, nausea/vomiting or stool incontinence Genito-Urinary ROS: negative for - dysuria, hematuria, incontinence or urinary frequency/urgency Musculoskeletal ROS: negative for - joint swelling or muscular weakness Neurological ROS: as noted in HPI Dermatological ROS: negative for rash and skin lesion changes   General Exam                                                                                                      Blood pressure (!) 151/101, pulse (!) 111, temperature 98.2 F (36.8 C), temperature source Oral, resp. rate 14, height 5\' 3"  (1.6 m), weight 94.4 kg (208 lb 1.8 oz), SpO2 98 %. HEENT-  Normocephalic, no lesions, without obvious abnormality.  Normal external eye and  conjunctiva.  Normal TM's bilaterally.  Normal auditory canals and external ears. Normal external nose, mucus membranes and septum.  Normal pharynx. Cardiovascular- regular rate and rhythm, S1, S2 normal, no murmur, click, rub or gallop, pulses palpable throughout   Lungs- chest clear, no wheezing, rales, normal symmetric air entry, Heart exam - S1, S2 normal, no murmur, no gallop, rate regular Abdomen- soft, non-tender; bowel sounds normal; no masses,  no organomegaly Extremities- less then 2 second capillary refill Lymph-no adenopathy palpable Musculoskeletal-no joint tenderness, deformity or swelling Skin-warm and dry, no hyperpigmentation, vitiligo, or suspicious lesions  Neurological Examination Mental Status: Alert, oriented, thought content appropriate.  Speech fluent without evidence of aphasia.  Able to follow 3 step commands without difficulty. Cranial Nerves: Pupils are equal round reactive to light and accommodation. There is no facial asymmetry. Motor: Mild paresis of right upper extremity which is 4 out of 5 Sensory: Grossly intact Deep Tendon Reflexes: 2+ and symmetric throughout Plantars: Right:  downgoing   Left: downgoing Cerebellar: Grossly normal. Gait: Not tested.      Lab Results: Basic Metabolic Panel:  Recent Labs Lab 04/18/16 1226  NA 138  K 3.5  CL 103  CO2 24  GLUCOSE 191*  BUN 10  CREATININE 0.54  CALCIUM 9.4    Liver Function Tests:  Recent Labs Lab 04/18/16 1226  AST 43*  ALT 45  ALKPHOS 94  BILITOT 1.0  PROT 7.9  ALBUMIN 4.4    Recent Labs Lab 04/18/16 1226  LIPASE 17   No results for input(s): AMMONIA in the last 168 hours.  CBC:  Recent Labs Lab 04/18/16 1226  WBC 8.8  NEUTROABS 5.4  HGB 15.2  HCT 44.1  MCV 92.7  PLT 192    Cardiac Enzymes:  Recent Labs Lab 04/18/16 1226  TROPONINI <0.03    Lipid Panel: No results for input(s): CHOL, TRIG, HDL, CHOLHDL, VLDL, LDLCALC in the last 168 hours.  CBG: No results for input(s): GLUCAP in the last 168 hours.  Microbiology: Results for orders placed or performed during the hospital encounter of 09/14/15  Urine culture     Status: None   Collection Time: 09/14/15  6:25 PM  Result Value Ref Range Status   Specimen Description URINE, RANDOM  Final   Special Requests NONE  Final   Culture   Final    MULTIPLE SPECIES PRESENT, SUGGEST RECOLLECTION [facility]    Report Status 09/16/2015 FINAL  Final    Coagulation Studies: No results for input(s): LABPROT, INR in the last 72 hours.  Imaging: Ct Head Wo Contrast  Result Date: 04/18/2016 CLINICAL DATA:  50 year old female -follow-up left intraparenchymal hemorrhage. Possible blown pupil. EXAM: CT HEAD WITHOUT CONTRAST TECHNIQUE: Contiguous axial images were obtained from the base of the skull through the vertex without intravenous contrast. COMPARISON:  04/18/2016 at 2:06 p.m. FINDINGS: A 2.8 x 4.1 cm left parietal intraparenchymal hemorrhage is unchanged with intraventricular extension into the left lateral ventricle. 2 mm left to right midline shift is again noted. Some effacement of the left  basilar cisterns again noted. There is no evidence of hydrocephalus, new hemorrhage or other significant change. IMPRESSION: No significant change since the prior study with 2.8 x 4.1 cm left parietal intraparenchymal hemorrhage with intraventricular extension and 2 mm left to right midline shift with some effacement of the left basilar cisterns. Electronically Signed   By: Harmon PierJeffrey  Hu M.D.   On: 04/18/2016 18:59   Ct Head Wo Contrast  Result  Date: 04/18/2016 CLINICAL DATA:  Altered mental status. EXAM: CT HEAD WITHOUT CONTRAST TECHNIQUE: Contiguous axial images were obtained from the base of the skull through the vertex without intravenous contrast. COMPARISON:  CT scan of July 20, 2015. FINDINGS: Bony calvarium appears intact. Large intraparenchymal hemorrhage is seen in left posterior parietal region measuring 40 x 29 mm. Intraventricular hemorrhage is noted in the left lateral ventricle and a small amount is noted in third ventricle. Small amount of subarachnoid hemorrhage is noted in the left temporal and posterior parietal regions as well. No ventricular enlargement is noted. 4 mm of left-to-right midline shift is noted. Mild bilateral ethmoid sinusitis is noted. IMPRESSION: Large intraparenchymal hemorrhage is noted in left posterior parietal cortex with extension into the left lateral and third ventricles. Small amount of left-sided subarachnoid hemorrhage is noted. 4 mm of left-to-right midline shift is noted. Critical Value/emergent results were called by telephone at the time of interpretation on 04/18/2016 at 2:22 pm to Dr. Toney Rakes , who verbally acknowledged these results. Electronically Signed   By: Lupita Raider, M.D.   On: 04/18/2016 14:22   Dg Chest Portable 1 View  Result Date: 04/18/2016 CLINICAL DATA:  Altered mental status today. The patient is pale and diaphoretic. EXAM: PORTABLE CHEST 1 VIEW COMPARISON:  Single-view of the chest 08/19/2015 and 11/29/2014. FINDINGS: The lungs  are clear. Heart size is upper normal. No pneumothorax or pleural effusion. Aortic atherosclerosis is noted. IMPRESSION: No acute disease. Atherosclerosis. Electronically Signed   By: Drusilla Kanner M.D.   On: 04/18/2016 15:13    Assessment/Plan:  Jennette Kettle is a pleasant 50 year old patient she presents today with a Complaint of headache and diffuse body aches. Her CT of the brain reveals a large left parietal occipital hemorrhage with intraventricular extension. She has slight slurring of her speech and mild right upper extremity paresis. He is otherwise neurologically intact. She has been evaluated by the neurosurgery team. She will require close neurological checks. Her blood glucose is noted to be elevated but she does not have a history of diabetes.  Plan:  1. Close neurological observation.  2. Repeat CT in a.m.  3. Address elevated glucose as needed.    Amarise Lillo A. Hilda Blades, M.D. Neurohospitalist Phone: [phone]  04/18/2016, 7:09 PM

## 2016-04-18 NOTE — ED Provider Notes (Addendum)
[facility] Emergency Department Provider Note   ____________________________________________   First MD Initiated Contact with Patient 04/18/16 1216     (approximate)  I have reviewed the triage vital signs and the nursing notes.   HISTORY  Chief Complaint Altered Mental Status  Caveat-history of present illness review of systems is limited due to the patient's altered mental status. All information is obtained from EMS on their arrival.  HPI Stephanie [name] is a 50 y.o. female with HTN, hepatitis C, sciatica, recent diagnosis of CHF, kidney stones and drug seeking behavior, s/p right carpal tunnel release and a right de Quervain's first dorsal compartment release on 04/15/16 presenting evaluation of altered mental status and "pain all over". According to EMS, 911 was called by a neighbor because the patient was heard crying out for help complaining of pain all over. On their arrival she appeared altered and then began having dry heaves in transit. No other history is obtained.      Past Medical History:  Diagnosis Date  . Diverticulitis   . Hepatitis C   . Hypertension   . Kidney stones   . Sciatica     Patient Active Problem List   Diagnosis Date Noted  . Hepatitis C     Past Surgical History:  Procedure Laterality Date  . ABDOMINAL HYSTERECTOMY    . KIDNEY STONE SURGERY Right 02/12/12  . KIDNEY STONE SURGERY Left 07/15/2012  . LITHOTRIPSY    . OOPHORECTOMY    . SINUS EXPLORATION      Prior to Admission medications   Medication Sig Start Date End Date Taking? Authorizing Provider  alprazolam Prudy Feeler(XANAX) 2 MG tablet Take 2 mg by mouth 2 (two) times daily as needed for anxiety (anxiety).     Historical Provider, MD  Aspirin-Salicylamide-Caffeine (BC HEADACHE POWDER PO) Take 1 packet by mouth daily as needed (headache).    Historical Provider, MD  cyclobenzaprine (FLEXERIL) 5 MG tablet Take 7.5 mg by mouth 3 (three) times daily as needed for  muscle spasms.  09/07/15   Historical Provider, MD  dicyclomine (BENTYL) 20 MG tablet Take 20 mg by mouth 4 (four) times daily.  07/26/15 07/25/16  Historical Provider, MD  FLUoxetine (PROZAC) 40 MG capsule Take 40 mg by mouth every morning.     Historical Provider, MD  hydrochlorothiazide (HYDRODIURIL) 50 MG tablet Take 50 mg by mouth daily.  08/24/15   Historical Provider, MD  losartan (COZAAR) 25 MG tablet Take 50 mg by mouth daily.  07/28/15 07/27/16  Historical Provider, MD  omeprazole (PRILOSEC) 40 MG capsule Take 40 mg by mouth. 07/28/15   Historical Provider, MD  pregabalin (LYRICA) 100 MG capsule Take 100 mg by mouth 3 (three) times daily.    Historical Provider, MD  promethazine (PHENERGAN) 25 MG tablet Take 25 mg by mouth every 6 (six) hours as needed for nausea (nausea).     Historical Provider, MD  zolpidem (AMBIEN) 10 MG tablet Take 10 mg by mouth at bedtime.     Historical Provider, MD    Allergies Aspirin; Codeine; Compazine; Ioxaglate; Ivp dye [iodinated diagnostic agents]; Ketorolac tromethamine; Metrizamide; Morphine and related; Naproxen; Norco [hydrocodone-acetaminophen]; Penicillins; Prochlorperazine maleate; Reglan [metoclopramide]; Tegretol [carbamazepine]; Toradol [ketorolac tromethamine]; and Tramadol  Family History  Problem Relation Age of Onset  . Heart failure Mother   . Diabetes Father   . Cancer Sister     Social History Social History  Substance Use Topics  . Smoking status: Current Every Day Smoker  Packs/day: 0.25    Years: 1.50    Types: Cigarettes  . Smokeless tobacco: Never Used  . Alcohol use No    Review of Systems  Caveat-history of present illness review of systems is limited due to the patient's altered mental status. All information is obtained from EMS on their arrival. ____________________________________________   PHYSICAL EXAM:  Vitals:   04/18/16 1330 04/18/16 1415 04/18/16 1430 04/18/16 1445  BP: (!) 203/116 (!) 186/84 (!)  182/99 (!) 158/95  Pulse: 87 92 94 94  Resp: (!) 28  Temp: (!) 95.9 F (35.5 C) (!) 95.8 F (35.4 C) (!) 96 F (35.6 C) (!) 96.1 F (35.6 C)  TempSrc:      SpO2: 94% 100% 98% 97%  Weight:      Height:         Constitutional: Awake and alert, oriented to self, actively dry heaving.Complaining that she hurts all over but not able to give me any specific information about her pain. Eyes: Conjunctivae are normal. PERRL. EOMI. Head: Atraumatic. Nose: No congestion/rhinnorhea. Mouth/Throat: Mucous membranes are moist.  Oropharynx non-erythematous. Neck: No stridor.  Supple without meningismus. Cardiovascular: Normal rate, regular rhythm. Grossly normal heart sounds.  Good peripheral circulation. Respiratory: Normal respiratory effort.  No retractions. Lungs CTAB. Gastrointestinal: Soft and nontender. No distention. No CVA tenderness. Genitourinary: Deferred Musculoskeletal: No lower extremity tenderness nor edema.  No joint effusions.Short arm splint right arm, brisk capillary refill in the fingertips. Neurologic:  Normal speech and language. No gross focal neurologic deficits are appreciated.  Extremity spontaneously and equally. Skin:  Skin is warm, dry and intact. No rash noted. Psychiatric: Unable to assess.  ____________________________________________   LABS (all labs ordered are listed, but only abnormal results are displayed)  Labs Reviewed  COMPREHENSIVE METABOLIC PANEL - Abnormal; Notable for the following:       Result Value   Glucose, Bld 191 (*)    AST 43 (*)    All other components within normal limits  URINALYSIS COMPLETEWITH MICROSCOPIC ([facility] ONLY) - Abnormal; Notable for the following:    Color, Urine STRAW (*)    APPearance CLEAR (*)    Glucose, UA >500 (*)    Ketones, ur TRACE (*)    Hgb urine dipstick 1+ (*)    Protein, ur 30 (*)    Leukocytes, UA TRACE (*)    Bacteria, UA RARE (*)    Squamous Epithelial / LPF 0-5 (*)    All other components  within normal limits  LACTIC ACID, PLASMA - Abnormal; Notable for the following:    Lactic Acid, Venous 2.3 (*)    All other components within normal limits  CULTURE, BLOOD (ROUTINE X 2)  CULTURE, BLOOD (ROUTINE X 2)  CBC WITH DIFFERENTIAL/PLATELET  LIPASE, BLOOD  TROPONIN I  ETHANOL  LACTIC ACID, PLASMA  TSH  T4, FREE  URINE DRUG SCREEN, QUALITATIVE ([facility] ONLY)   ____________________________________________  EKG  ED ECG REPORT I, Gayla Doss, the attending physician, personally viewed and interpreted this ECG.   Date: 04/18/2016  EKG Time: 12:36  Rate: 90  Rhythm: normal sinus rhythm  Axis: normal  Intervals:none  ST&T Change: No acute ST elevation or acute ST depression. Baseline wander.  ____________________________________________  RADIOLOGY  CXR  IMPRESSION: No acute disease.  Atherosclerosis.   CT head IMPRESSION: Large intraparenchymal hemorrhage is noted in left posterior parietal cortex with extension into the left lateral and third ventricles. Small amount of left-sided subarachnoid hemorrhage is noted. 4  mm of left-to-right midline shift is noted. Critical Value/emergent results were called by telephone at the time of interpretation on 04/18/2016 at 2:22 pm to Dr. Toney Rakes , who verbally acknowledged these results.  ____________________________________________   PROCEDURES  Procedure(s) performed: None  Procedures  Critical Care performed: Yes, see critical care note(s)   CRITICAL CARE Performed by: Toney Rakes A   Total critical care time: 60 minutes  Critical care time was exclusive of separately billable procedures and treating other patients.  Critical care was necessary to treat or prevent imminent or life-threatening deterioration.  Critical care was time spent personally by me on the following activities: development of treatment plan with patient and/or surrogate as well as nursing, discussions with consultants,  evaluation of patient's response to treatment, examination of patient, obtaining history from patient or surrogate, ordering and performing treatments and interventions, ordering and review of laboratory studies, ordering and review of radiographic studies, pulse oximetry and re-evaluation of patient's condition. ____________________________________________   INITIAL IMPRESSION / ASSESSMENT AND PLAN / ED COURSE  Pertinent labs & imaging results that were available during my care of the patient were reviewed by me and considered in my medical decision making (see chart for details).  Stephanie [name] is a 50 y.o. female with HTN, hepatitis C, sciatica, recent diagnosis of CHF, kidney stones and drug seeking behavior, s/p right carpal tunnel release and a right de Quervain's first dorsal compartment release on 04/15/16 presenting evaluation of altered mental status and "pain all over". On arrival to the emergency department. She is awake, alert, retching, her vital signs are notable for hypothermic and she is hypertensive and intermittently tachypneic, needing multiple Sirs criteria, will place bair hugger, give liberal IV fluids, antibiotics and anticipate admission. Will treat hypertension with hydralazine and reassess. Plan for plain films of the chest abdomen pelvis given her vomiting to rule out obstruction, CT head, urinalysis.  ----------------------------------------- 1:47 PM on 04/18/2016 ----------------------------------------- Patient with improvement of her sensorium, she is no longer retching/vomiting. She reports to me "I don't know what made me come here I was just feeling bad". She is still not able to give any other history.  ----------------------------------------- 2:36 PM on 04/18/2016 ----------------------------------------- CT scan shows left-sided intraparenchymal hemorrhage with extension into the ventricle, 2 mm midline shift, likely hypertensive hemorrhage. We'll start and  titrate continuous nicardipine drip and discuss with [facility] for transfer. At this point, I do not think her clinical picture represents sepsis, I think that the cause of her altered mental status and vomiting is the intraparenchymal hemorrhage.  ----------------------------------------- 3:13 PM on 04/18/2016 ----------------------------------------- Blood pressure improved to 158/95, patient remains awake, alert, oriented. Discussed the case with Dr. Lovell Sheehan of [facility] neurosurgery. I discussed the case with Dr. Hilda Blades of neurology who will accept the patient to [facility] ICU.    Clinical Course     ____________________________________________   FINAL CLINICAL IMPRESSION(S) / ED DIAGNOSES  Final diagnoses:  Hypertensive intracerebral hemorrhage (HCC)  Altered mental status, unspecified altered mental status type  Hypertensive emergency      NEW MEDICATIONS STARTED DURING THIS VISIT:  New Prescriptions   No medications on file     Note:  This document was prepared using Dragon voice recognition software and may include unintentional dictation errors.    Gayla Doss, MD 04/18/16 1516    Gayla Doss, MD 04/18/16 [phone]

## 2016-04-18 NOTE — ED Notes (Signed)
Report given to Kendall, RN.  

## 2016-04-18 NOTE — ED Notes (Signed)
Pt states she feels like she is suffocating, this RN explained to patient the need to keep bear hugger on, pt's son notified this RN that patient has removed bear hugger.

## 2016-04-18 NOTE — ED Notes (Signed)
Instructed patient to remain laying in bed. Pt asks when she is going home. This RN explained to patient that she was going to be transferred. Pt states understanding at this time.

## 2016-04-18 NOTE — ED Notes (Signed)
[facility] arrived at bedside for patient

## 2016-04-18 NOTE — ED Notes (Signed)
Pt's husband to bedside at this time. Dr. Alphonzo LemmingsMcshane explained to patient husband on pt progress.

## 2016-04-18 NOTE — ED Triage Notes (Signed)
Pt presents to ED via [facility] from home. Per EMS pt had hand surgery on Tuesday, upon their arrival pt was semi-alert but unable to answer any questions. Pt presents to ED and is noted to be mildly uncooperative, slow to answer questions, pt is also noted to be pale, diaphoretic and clammy upon arrival. Per EMS when they hit bumps pt would complain of "it hurts all over". Pt's son states that EMS was called by a neighbor and when they found her [place] was [place] CBG 135 at this time.

## 2016-04-19 ENCOUNTER — Inpatient Hospital Stay (HOSPITAL_COMMUNITY): Payer: 59

## 2016-04-19 DIAGNOSIS — G936 Cerebral edema: Secondary | ICD-10-CM

## 2016-04-19 DIAGNOSIS — I611 Nontraumatic intracerebral hemorrhage in hemisphere, cortical: Secondary | ICD-10-CM

## 2016-04-19 DIAGNOSIS — I6789 Other cerebrovascular disease: Secondary | ICD-10-CM

## 2016-04-19 DIAGNOSIS — I10 Essential (primary) hypertension: Secondary | ICD-10-CM

## 2016-04-19 LAB — ECHOCARDIOGRAM COMPLETE
Height: 63 in
Weight: 3329.83 oz

## 2016-04-19 MED ORDER — PANTOPRAZOLE SODIUM 40 MG PO TBEC
80.0000 mg | DELAYED_RELEASE_TABLET | Freq: Every day | ORAL | Status: DC
  Administered 2016-04-19 – 2016-04-30 (×12): 80 mg via ORAL
  Filled 2016-04-19 (×13): qty 2

## 2016-04-19 MED ORDER — DICYCLOMINE HCL 20 MG PO TABS
20.0000 mg | ORAL_TABLET | Freq: Four times a day (QID) | ORAL | Status: DC
  Administered 2016-04-19 – 2016-04-20 (×6): 20 mg via ORAL
  Filled 2016-04-19 (×9): qty 1

## 2016-04-19 MED ORDER — FLUOXETINE HCL 20 MG PO CAPS
40.0000 mg | ORAL_CAPSULE | Freq: Every day | ORAL | Status: DC
  Administered 2016-04-19 – 2016-04-20 (×2): 40 mg via ORAL
  Filled 2016-04-19 (×2): qty 2

## 2016-04-19 MED ORDER — HYDROCHLOROTHIAZIDE 25 MG PO TABS
50.0000 mg | ORAL_TABLET | Freq: Every day | ORAL | Status: DC
  Administered 2016-04-19 – 2016-04-20 (×2): 50 mg via ORAL
  Filled 2016-04-19 (×2): qty 2

## 2016-04-19 MED ORDER — LOSARTAN POTASSIUM 50 MG PO TABS
50.0000 mg | ORAL_TABLET | Freq: Every day | ORAL | Status: DC
  Administered 2016-04-19 – 2016-04-20 (×2): 50 mg via ORAL
  Filled 2016-04-19 (×2): qty 1

## 2016-04-19 NOTE — Progress Notes (Signed)
PT Cancellation Note  Patient Details Name: Stephanie GuerinDianna B [name] MRN: [id number] DOB: 1966/08/13   Cancelled Treatment:    Reason Eval/Treat Not Completed: Patient not medically ready (actively on bedrest at this time)   Fabio AsaWerner, Defne Gerling J 04/19/2016, 7:10 AM Charlotte Crumbevon Camillia Marcy, PT DPT  [phone]

## 2016-04-19 NOTE — Progress Notes (Signed)
No issues overnight. Pt has minimal HA.   EXAM:  BP (!) 142/83   Pulse (!) 117   Temp 99 F (37.2 C) (Core (Comment))   Resp 20   Ht 5\' 3"  (1.6 m)   Wt 94.4 kg (208 lb 1.8 oz)   SpO2 97%   BMI 36.87 kg/m   Drowsy but easily arousable Briskly FC all extremities CN grossly intact Good strength  IMPRESSION:  50 y.o. female with left parietal lobar IPH, likely hypertensive. Remains neurologically stable.   PLAN: - Cont close neurologic observation. - BP control

## 2016-04-19 NOTE — Progress Notes (Signed)
OT Cancellation Note  Patient Details Name: Stephanie [name] MRN: [id number] DOB: [birth year]   Cancelled Treatment:    Reason Eval/Treat Not Completed: Patient not medically ready (on bedrest)  [facility]ILL[facility]  Victory Dresden, OTR/L  [phone] 04/19/2016 04/19/2016, 7:56 AM

## 2016-04-19 NOTE — Evaluation (Signed)
Clinical/Bedside Swallow Evaluation Patient Details  Name: Stephanie [name] MRN: [id number] Date of Birth: 1966/01/06  Today's Date: 04/19/2016 Time: SLP Start Time (ACUTE ONLY): 0944 SLP Stop Time (ACUTE ONLY): 1015 SLP Time Calculation (min) (ACUTE ONLY): 31 min  Past Medical History:  Past Medical History:  Diagnosis Date  . Diverticulitis   . Hepatitis C   . Hypertension   . Kidney stones   . Sciatica    Past Surgical History:  Past Surgical History:  Procedure Laterality Date  . ABDOMINAL HYSTERECTOMY    . KIDNEY STONE SURGERY Right 02/12/12  . KIDNEY STONE SURGERY Left 07/15/2012  . LITHOTRIPSY    . OOPHORECTOMY    . SINUS EXPLORATION     HPI:  Stephanie B Phillipsis an 50 y.o.femalewho presented to [facility] with a complaint of diffuse pain as well as headache. She has a known history of hepatitis C,sciatica, recent history of congestive heart failure, and drug seeking behavior. She presented to the emergency room with a complaint of headache and diffuse pain. A CT of the brain was performed and revealed the presence of a large left parieto-occipital hematoma with significant intraventricular extension.   Assessment / Plan / Recommendation Clinical Impression  Pt demonstrates excellent tolerance of thin liqudis and is eager to drink. Perseveration and right field cut impact pts ability to transition between solids and liquids, so assist was needed to improve awareness of solids. Mastication was eventually adequate but pt will need supervision with meals. Will f/u x1 for tolerance given concern for right oral sensaory deficits and possiblity of pocketing with a full meal.     Aspiration Risk  Mild aspiration risk    Diet Recommendation Regular;Thin liquid   Liquid Administration via: Cup;Straw Medication Administration: Whole meds with liquid Supervision: Full supervision/cueing for compensatory strategies Compensations: Slow rate;Small sips/bites;Minimize environmental  distractions;Lingual sweep for clearance of pocketing Postural Changes: Seated upright at 90 degrees    Other  Recommendations Oral Care Recommendations: Oral care BID   Follow up Recommendations  Inpatient Rehab    Frequency and Duration min 1 x/week  1 week       Prognosis        Swallow Study   General HPI: Stephanie B Phillipsis an 50 y.o.femalewho presented to [facility] with a complaint of diffuse pain as well as headache. She has a known history of hepatitis C,sciatica, recent history of congestive heart failure, and drug seeking behavior. She presented to the emergency room with a complaint of headache and diffuse pain. A CT of the brain was performed and revealed the presence of a large left parieto-occipital hematoma with significant intraventricular extension. Type of Study: Bedside Swallow Evaluation Previous Swallow Assessment: none Diet Prior to this Study: NPO Temperature Spikes Noted: No Respiratory Status: Nasal cannula History of Recent Intubation: No Behavior/Cognition: Alert;Cooperative;Confused;Requires cueing Oral Cavity Assessment: Dry Oral Care Completed by SLP: No Oral Cavity - Dentition: Poor condition Vision: Functional for self-feeding Self-Feeding Abilities: Able to feed self;Needs assist Patient Positioning: Upright in bed Baseline Vocal Quality: Normal Volitional Cough: Strong Volitional Swallow: Unable to elicit    Oral/Motor/Sensory Function Overall Oral Motor/Sensory Function: Other (comment) (sensory impairment on the right)   Ice Chips Ice chips: Not tested   Thin Liquid Thin Liquid: Within functional limits Presentation: Cup;Straw    Nectar Thick Nectar Thick Liquid: Not tested   Honey Thick Honey Thick Liquid: Not tested   Puree Puree: Within functional limits Presentation:  (total assist)   Solid   GO  Solid: Impaired Oral Phase Impairments: Poor awareness of bolus       Harlon Ditty, MA CCC-SLP [phone]  Blane Worthington, Riley Nearing 04/19/2016,10:35 AM

## 2016-04-19 NOTE — Progress Notes (Signed)
STROKE TEAM PROGRESS NOTE   HISTORY OF PRESENT ILLNESS (per record) Stephanie [name] is an 50 y.o. female who presented to [facility] with a complaint of diffuse pain as well as headache. She has a known history of hepatitis C, sciatica, recent history of congestive heart failure, and drug seeking behavior. She presented to the emergency room with a complaint of headache and diffuse pain. A CT of the brain was performed and revealed the presence of a large left parieto-occipital hematoma with significant intraventricular extension. [facility] was [facility] care. Patient was not administered IV t-PA secondary to ICH. She was admitted to the neuro ICU for further evaluation and treatment.   SUBJECTIVE (INTERVAL HISTORY) Her son is at the bedside.  She has passed her swallow eval this am. She states she loves water.  Overall she feels her condition is stable. Family informs me that she has a long-standing history of poorly controlled hypertension   OBJECTIVE Temp:  [95.8 F (35.4 C)-99 F (37.2 C)] 99 F (37.2 C) (08/11 0800) Pulse Rate:  [33-127] 112 (08/11 0800) Cardiac Rhythm: Sinus tachycardia (08/11 0800) Resp:  [12-41] 15 (08/11 0800) BP: (108-209)/(73-131) 147/78 (08/11 0800) SpO2:  [53 %-100 %] 95 % (08/11 0800) Weight:  [94.4 kg (208 lb 1.8 oz)-96.8 kg (213 lb 8 oz)] 94.4 kg (208 lb 1.8 oz) (08/10 1900)  CBC:   Recent Labs Lab 04/18/16 1226  WBC 8.8  NEUTROABS 5.4  HGB 15.2  HCT 44.1  MCV 92.7  PLT 192    Basic Metabolic Panel:   Recent Labs Lab 04/18/16 1226  NA 138  K 3.5  CL 103  CO2 24  GLUCOSE 191*  BUN 10  CREATININE 0.54  CALCIUM 9.4    Lipid Panel: No results found for: CHOL, TRIG, HDL, CHOLHDL, VLDL, LDLCALC HgbA1c: No results found for: HGBA1C Urine Drug Screen: No results found for: LABOPIA, COCAINSCRNUR, LABBENZ, AMPHETMU, THCU, LABBARB    IMAGING  Ct Head Wo Contrast  Result Date: 04/18/2016 CLINICAL  DATA:  50 year old female -follow-up left intraparenchymal hemorrhage. Possible blown pupil. EXAM: CT HEAD WITHOUT CONTRAST TECHNIQUE: Contiguous axial images were obtained from the base of the skull through the vertex without intravenous contrast. COMPARISON:  04/18/2016 at 2:06 p.m. FINDINGS: A 2.8 x 4.1 cm left parietal intraparenchymal hemorrhage is unchanged with intraventricular extension into the left lateral ventricle. 2 mm left to right midline shift is again noted. Some effacement of the left basilar cisterns again noted. There is no evidence of hydrocephalus, new hemorrhage or other significant change. IMPRESSION: No significant change since the prior study with 2.8 x 4.1 cm left parietal intraparenchymal hemorrhage with intraventricular extension and 2 mm left to right midline shift with some effacement of the left basilar cisterns. Electronically Signed   By: Harmon PierJeffrey  Hu M.D.   On: 04/18/2016 18:59   Ct Head Wo Contrast  Result Date: 04/18/2016 CLINICAL DATA:  Altered mental status. EXAM: CT HEAD WITHOUT CONTRAST TECHNIQUE: Contiguous axial images were obtained from the base of the skull through the vertex without intravenous contrast. COMPARISON:  CT scan of July 20, 2015. FINDINGS: Bony calvarium appears intact. Large intraparenchymal hemorrhage is seen in left posterior parietal region measuring 40 x 29 mm. Intraventricular hemorrhage is noted in the left lateral ventricle and a small amount is noted in third ventricle. Small amount of subarachnoid hemorrhage is noted in the left temporal and posterior parietal regions as well. No ventricular enlargement is noted. 4  mm of left-to-right midline shift is noted. Mild bilateral ethmoid sinusitis is noted. IMPRESSION: Large intraparenchymal hemorrhage is noted in left posterior parietal cortex with extension into the left lateral and third ventricles. Small amount of left-sided subarachnoid hemorrhage is noted. 4 mm of left-to-right midline shift  is noted. Critical Value/emergent results were called by telephone at the time of interpretation on 04/18/2016 at 2:22 pm to Dr. Toney Rakes , who verbally acknowledged these results. Electronically Signed   By: Lupita Raider, M.D.   On: 04/18/2016 14:22   Dg Chest Portable 1 View  Result Date: 04/18/2016 CLINICAL DATA:  Altered mental status today. The patient is pale and diaphoretic. EXAM: PORTABLE CHEST 1 VIEW COMPARISON:  Single-view of the chest 08/19/2015 and 11/29/2014. FINDINGS: The lungs are clear. Heart size is upper normal. No pneumothorax or pleural effusion. Aortic atherosclerosis is noted. IMPRESSION: No acute disease. Atherosclerosis. Electronically Signed   By: Drusilla Kanner M.D.   On: 04/18/2016 15:13       PHYSICAL EXAM Obese middle aged caucasian lady not in distress. . Afebrile. Head is nontraumatic. Neck is supple without bruit.    Cardiac exam no murmur or gallop. Lungs are clear to auscultation. Distal pulses are well felt. Neurological Exam :  Awake alert disoriented. Unable to name the current president or name the hospital. Some verbal perseveration. No aphasia or dysarthria but slightly nonfluent speech. Pupils equal reactive. Fundi were not visualized. Right homonymous hemianopsia and right-sided visual neglect. Left gaze preference but able to look to the right past midline. Slight right lower facial asymmetry when she smiles. Tongue midline. Right hemiplegia with 3/5 right upper extremity strength but this is limited due to the carpal tunnel surgery bandage on the right forearm. Mild right lower extremity drift. Weakness of right hip flexors and ankle dorsiflexors. Sensation is slightly diminished on the right compared to the left. Right plantar upgoing left downgoing. Gait was not tested. ASSESSMENT/PLAN Stephanie [name] is a 50 y.o. female with history of hepatitis C, sciatica, recent history of congestive heart failure, and drug seeking behavior presenting to  [facility] with complaints of diffuse pain and HA. CT showed a large L parieto-occipital hemorrhage with IVH. 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 4mm L shift  Repeat CT head at 1859 without significant change, 2mm L shift with some effacement of L basilar cisterns  MRI ordered  MRA ordered  2D Echo  ordered  SCDs for VTE prophylaxis Diet NPO time specified  BC Powders prior to admission  Ongoing aggressive stroke risk factor management  Therapy recommendations:  pending. May be OOB, order written.  Disposition:  pending   Keep in ICU at least 24h given hemorrhage  Hypertensive Emergency  BP 203/116 on arrival to [place] in setting of neurologic symptoms  Placed on cardene drip  BP within goal this am  Resumed home BP medications:    SBP goal < 160 x 24h then < 180  Long-term BP goal normotensive  Other Stroke Risk Factors  Cigarette smoker, advised to stop smoking  Obesity, Body mass index is 36.87 kg/m., recommend weight loss, diet and exercise as appropriate   Congestive [facility] day # 1  Rhoderick Moody  [facility] See Amion for Pager information 04/19/2016 10:38 AM  I have personally examined this patient, reviewed notes, independently viewed imaging studies, participated in medical decision making and plan of care. I have made any additions or clarifications directly to the above note. Agree with note above. Patient presented with severe headache and brain scan shows a larger left parieto-occipital parenchymal hemorrhage slight intraventricular extension but no hydrocephalus. Etiology likely hypertensive given long-standing history of poorly controlled  hypertension. Patient remains at risk for neurological worsening, hydrocephalus, cerebral edema, brain herniation and needs aggressive blood pressure control and close neurological monitoring. Plan check MRI scan of the brain later. Strict blood pressure control. I had a long discussion held the bedside with the patient's husband about her prognosis, plan for treatment and answered questions. This patient is critically ill and at significant risk of neurological worsening, death and care requires constant monitoring of vital signs, hemodynamics,respiratory and cardiac monitoring, extensive review of multiple databases, frequent neurological assessment, discussion with family, other specialists and medical decision making of high complexity.I have made any additions or clarifications directly to the above note.This critical care time does not reflect procedure time, or teaching time or supervisory time of PA/NP/Med Resident etc but could involve care discussion time.  I spent 40 minutes of neurocritical care time  in the care of  this patient.      Delia Heady, MD Medical Director [facility] Pager: [phone] 04/19/2016 1:45 PM    To contact Stroke Continuity provider, please refer to [email or web address]. After hours, contact General Neurology

## 2016-04-19 NOTE — Progress Notes (Signed)
  Echocardiogram 2D Echocardiogram has been performed.  Stephanie [name], Stephanie [name] R 04/19/2016, 1:48 PM

## 2016-04-19 NOTE — Progress Notes (Signed)
Inpatient Diabetes Program Recommendations  AACE/ADA: New Consensus Statement on Inpatient Glycemic Control (2015)  Target Ranges:  Prepandial:   less than 140 mg/dL      Peak postprandial:   less than 180 mg/dL (1-2 hours)      Critically ill patients:  140 - 180 mg/dL  Results for Stephanie [name], Stephanie [name] (MRN [id number]) as of 04/19/2016 08:50  Ref. Range 04/18/2016 12:26  Glucose Latest Ref Range: 65 - 99 mg/dL 202191 (H)    Review of Glycemic Control  Diabetes history: No Outpatient Diabetes medications: NA Current orders for Inpatient glycemic control: None  Inpatient Diabetes Program Recommendations: Correction (SSI): Lab glucose 191 mg/dl at 54:2712:26 on 0/62/378/10/17. Patient does not have a documented history of diabetes. Please consider ordering CBGs with Novolog sensitive correction scale if needed. HgbA1C: Please consider ordering an A1C to evaluate glycemic control over the past 2-3 months.  Thanks, Orlando PennerMarie Caden Fukushima, RN, MSN, CDE Diabetes Coordinator Inpatient Diabetes Program [phone] (Team Pager from 8am to 5pm) [phone] ([facility] office) [phone] [facility] office) [phone] [facility] office)

## 2016-04-19 NOTE — Evaluation (Signed)
Speech Language Pathology Evaluation Patient Details Name: Stephanie GuerinDianna B [name] MRN: [id number] DOB: 04-May-1966 Today's Date: 04/19/2016 Time: [id number] SLP Time Calculation (min) (ACUTE ONLY): 31 min  Problem List:  Patient Active Problem List   Diagnosis Date Noted  . ICH (intracerebral hemorrhage) (HCC) 04/18/2016  . Hepatitis C    Past Medical History:  Past Medical History:  Diagnosis Date  . Diverticulitis   . Hepatitis C   . Hypertension   . Kidney stones   . Sciatica    Past Surgical History:  Past Surgical History:  Procedure Laterality Date  . ABDOMINAL HYSTERECTOMY    . KIDNEY STONE SURGERY Right 02/12/12  . KIDNEY STONE SURGERY Left 07/15/2012  . LITHOTRIPSY    . OOPHORECTOMY    . SINUS EXPLORATION     HPI:  Stephanie B Phillipsis an 50 y.o.femalewho presented to [place] with a complaint of diffuse pain as well as headache. She has a known history of hepatitis C,sciatica, recent history of congestive heart failure, and drug seeking behavior. She presented to the emergency room with a complaint of headache and diffuse pain. A CT of the brain was performed and revealed the presence of a large left parieto-occipital hematoma with significant intraventricular extension.   Assessment / Plan / Recommendation Clinical Impression  Pt demonstrates cognitive lingusitic and visual impairment associated with left occipital-parietal hemorrhage. Pt has a visual field cut on the right, is perseverative with functional tasks, repeatedly trying to drink from items that are not a cup after finishing swallow eval. Self directed requests and tasks are intermittently linguistically correct, but with confrontational naming or automatic language or repetition pt is verbally perseverative or paraphasic. Recpetive language is also impaired with one step commands though pt can fluently answer more complex questions about care. Pt will need ongoing SLP therapy in acute setting and at lext level of  care to facilitate functional communication and cognition. Recommend CIR.     SLP Assessment  Patient needs continued Speech Lanaguage Pathology Services    Follow Up Recommendations  Inpatient Rehab    Frequency and Duration min 2x/week  2 weeks      SLP Evaluation Prior Functioning  Cognitive/Linguistic Baseline: Within functional limits  Lives With: Spouse Available Help at Discharge: Available PRN/intermittently   Cognition  Overall Cognitive Status: Impaired/Different from baseline Arousal/Alertness: Awake/alert Orientation Level: Oriented to person;Oriented to place;Oriented to situation;Disoriented to time Attention: Focused;Sustained Focused Attention: Appears intact Sustained Attention: Impaired Sustained Attention Impairment: Verbal basic;Functional basic Memory: Impaired Memory Impairment: Decreased short term memory;Decreased recall of new information Decreased Short Term Memory: Verbal basic Awareness: Impaired Awareness Impairment: Intellectual impairment;Emergent impairment;Anticipatory impairment Problem Solving: Impaired Problem Solving Impairment: Functional basic;Verbal basic Behaviors: Perseveration Safety/Judgment: Impaired    Comprehension  Auditory Comprehension Overall Auditory Comprehension: Impaired Yes/No Questions: Impaired Basic Biographical Questions: 51-75% accurate Commands: Impaired One Step Basic Commands: 25-49% accurate Conversation: Simple Interfering Components: Attention;Visual impairments EffectiveTechniques: Repetition;Visual/Gestural cues Reading Comprehension Reading Status: Unable to assess (comment)    Expression Expression Primary Mode of Expression: Verbal Verbal Expression Overall Verbal Expression: Impaired Initiation: No impairment Automatic Speech: Name;Social Response;Counting (counting impaired, name impaired) Level of Generative/Spontaneous Verbalization: Sentence Repetition: Impaired Level of Impairment:  Phrase level Naming: Impairment Responsive: Not tested Confrontation: Impaired Verbal Errors: Perseveration;Not aware of errors;Phonemic paraphasias Pragmatics: No impairment Interfering Components: Attention Written Expression Dominant Hand: Right   Oral / Motor  Oral Motor/Sensory Function Overall Oral Motor/Sensory Function: Other (comment) (sensory impairment on the right) Motor Speech Overall Motor Speech: Appears  within functional limits for tasks assessed   GO                   [facility], MA CCC-SLP [phone]  Claudine Mouton 04/19/2016, 10:49 AM

## 2016-04-20 ENCOUNTER — Encounter (HOSPITAL_COMMUNITY): Payer: Self-pay | Admitting: *Deleted

## 2016-04-20 MED ORDER — DICYCLOMINE HCL 20 MG PO TABS
20.0000 mg | ORAL_TABLET | Freq: Two times a day (BID) | ORAL | Status: DC
  Administered 2016-04-20 – 2016-04-30 (×20): 20 mg via ORAL
  Filled 2016-04-20 (×20): qty 1

## 2016-04-20 MED ORDER — LOSARTAN POTASSIUM 50 MG PO TABS
50.0000 mg | ORAL_TABLET | Freq: Two times a day (BID) | ORAL | Status: DC
  Administered 2016-04-20 – 2016-04-30 (×20): 50 mg via ORAL
  Filled 2016-04-20 (×20): qty 1

## 2016-04-20 MED ORDER — CETYLPYRIDINIUM CHLORIDE 0.05 % MT LIQD
7.0000 mL | Freq: Two times a day (BID) | OROMUCOSAL | Status: DC
  Administered 2016-04-20 – 2016-04-21 (×3): 7 mL via OROMUCOSAL

## 2016-04-20 MED ORDER — DULOXETINE HCL 60 MG PO CPEP
60.0000 mg | ORAL_CAPSULE | Freq: Every day | ORAL | Status: DC
  Administered 2016-04-21 – 2016-04-30 (×10): 60 mg via ORAL
  Filled 2016-04-20 (×10): qty 1

## 2016-04-20 NOTE — Progress Notes (Signed)
No issues overnight. Pt has HA this am.  EXAM:  BP (!) 171/115 (BP Location: Left Arm)   Pulse 95   Temp 98.8 F (37.1 C) (Core (Comment))   Resp 20   Ht 5\' 3"  (1.6 m)   Wt 94.4 kg (208 lb 1.8 oz)   SpO2 96%   BMI 36.87 kg/m   Awake, alert [place] FC all extremities Speech fluent, poor naming, (-) repetition CN grossly intact Good strength  IMPRESSION:  50 y.o. female with left parietal lobar IPH with IVH, likely hypertensive. Remains neurologically stable. No symptoms of HCP.  PLAN: - Cont neurologic observation. - BP control

## 2016-04-20 NOTE — Progress Notes (Signed)
STROKE TEAM PROGRESS NOTE   HISTORY OF PRESENT ILLNESS (per record) Stephanie [name] is an 50 y.o. female who presented to [place] with a complaint of diffuse pain as well as headache. She has a known history of hepatitis C, sciatica, recent history of congestive heart failure, and drug seeking behavior. She presented to the emergency room with a complaint of headache and diffuse pain. A CT of the brain was performed and revealed the presence of a large left parieto-occipital hematoma with significant intraventricular extension. She was transferred to [facility] for more definitive care. Patient was not administered IV t-PA secondary to ICH. She was admitted to the neuro ICU for further evaluation and treatment.   SUBJECTIVE (INTERVAL HISTORY) No major changes.  BP, both systolic and diastolic high.  Cardene gtt was stopped by RN due to previous neurologist's parameters.  OBJECTIVE Temp:  [98.2 F (36.8 C)-98.8 F (37.1 C)] 98.8 F (37.1 C) (08/12 0800) Pulse Rate:  [86-118] 95 (08/12 0900) Cardiac Rhythm: Sinus tachycardia (08/12 0800) Resp:  [8-22] 14 (08/12 0900) BP: (98-182)/(43-137) 182/106 (08/12 0900) SpO2:  [90 %-100 %] 99 % (08/12 0900)  CBC:   Recent Labs Lab 04/18/16 1226  WBC 8.8  NEUTROABS 5.4  HGB 15.2  HCT 44.1  MCV 92.7  PLT 192    Basic Metabolic Panel:   Recent Labs Lab 04/18/16 1226  NA 138  K 3.5  CL 103  CO2 24  GLUCOSE 191*  BUN 10  CREATININE 0.54  CALCIUM 9.4    Lipid Panel: No results found for: CHOL, TRIG, HDL, CHOLHDL, VLDL, LDLCALC HgbA1c: No results found for: HGBA1C Urine Drug Screen: No results found for: LABOPIA, COCAINSCRNUR, LABBENZ, AMPHETMU, THCU, LABBARB    IMAGING  Ct Head Wo Contrast 04/18/2016 No significant change since the prior study with 2.8 x 4.1 cm left parietal intraparenchymal hemorrhage with intraventricular extension and 2 mm left to right midline shift with some effacement of the left basilar  cisterns.    Ct Head Wo Contrast 04/18/2016 Large intraparenchymal hemorrhage is noted in left posterior parietal cortex with extension into the left lateral and third ventricles. Small amount of left-sided subarachnoid hemorrhage is noted. 4 mm of left-to-right midline shift is noted.     Mr Maxine Glenn Head Wo Contrast 04/20/2016  MRI HEAD  1. Similar size and appearance of acute right temporal occipital intraparenchymal hematoma measuring 4.0 x 2.8 x 2.7 cm (estimated volume 15 cc). Similar localized edema with trace 3 mm left-to-right shift.  2. Intraventricular extension with blood in the lateral and third ventricles. No evidence for hydrocephalus or ventricular trapping.  3. Small volume acute subarachnoid hemorrhage within the posterior cerebral hemispheres bilaterally, stable from prior CT.  4. Trace left subdural hemorrhage measuring up to 2 mm, difficult to visualize on prior study, but felt to be unchanged.   MRA HEAD IMPRESSION:  1. Negative intracranial MRA. No vascular abnormality identified underlying the acute hemorrhage.  2. No large or proximal arterial branch occlusion. No high-grade or correctable stenosis.  3. Multi focal atheromatous irregularity involving the anterior posterior circulations as above, predominantly involving the distal small vessels.     Dg Chest Portable 1 View 04/18/2016 No acute disease. Atherosclerosis.    PHYSICAL EXAM Obese middle aged caucasian lady not in distress. . Afebrile. Head is nontraumatic. Neck is supple without bruit.    Cardiac exam no murmur or gallop. Lungs are clear to auscultation. Distal pulses are well felt. Neurological Exam :  Awake  alert disoriented to month, date, but knows year and day.  Unable to name the current president or name the hospital. Some verbal perseveration. Fluent when speaks, but comprehension is impaired about 50%, including with complex multistep commands. Naming is impaired. Repetition is normal.  Pupils  equal reactive. Fundi were not visualized. Right homonymous hemianopsia and right-sided visual neglect. Slight right lower facial asymmetry when she smiles. Tongue midline. Right hemiplegia with 4/5 right upper extremity strength but this is limited due to the carpal tunnel surgery bandage on the right forearm. Mild right lower extremity drift. Weakness of right hip flexors and ankle dorsiflexors. Sensation is slightly diminished on the right compared to the left. Right plantar upgoing left downgoing. Gait was not tested.     ASSESSMENT/PLAN [name] is a 50 y.o. female with history of hepatitis C, sciatica, recent history of congestive heart failure, and drug seeking behavior presenting to [place] with complaints of diffuse pain and HA. CT showed a large L parieto-occipital hemorrhage with IVH. [facility]  Stroke:  Large left parieto-occipital hemorrhage with IVH, likely hypertensive etiology  Neurosurgery consult - no surgery or IVC, manage BP, will follow CT  CT at [place] 1422 large L posterior parietal cortex hemorrhage with IVH, small SAH L temporal and posterior parietal. 4mm L shift  Repeat CT head at 1859 without significant change, 2mm L shift with some effacement of L basilar cisterns  MRI - See above  MRA - negative  2D Echo - EF 65-70%. No cardiac source of emboli identified.  SCDs for VTE prophylaxis Diet regular Room service appropriate? Yes; Fluid consistency: Thin  BC Powders prior to admission  Ongoing aggressive stroke risk factor management  Therapy recommendations:  pending. May be OOB, order written.  Disposition:  pending   Keep in ICU at least 24h given hemorrhage  Hypertensive Emergency  BP 203/116 on arrival to [facility] in setting of neurologic symptoms  Placed on cardene drip  BP within goal this am  Resumed home BP medications:    SBP goal < 160 x 24h then < 180  Long-term BP goal  normotensive   Other Stroke Risk Factors  Cigarette smoker, advised to stop smoking  Obesity, Body mass index is 36.87 kg/m., recommend weight loss, diet and exercise as appropriate   Congestive Heart Failre  Elevated glucose levels - monitor  Check labs in a.m.  Hospital day # 2     Hypertensive hemorrhage.  I would like to see the BP better controlled.  I will re-start the Cardene gtt and RN to titrate between 5-15 mg/hr for goal SBP < 160 and DBP < 90.    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      Weston SettleShervin Areatha Kalata, MD 04/20/2016 9:37 AM    To contact Stroke Continuity provider, please refer to [email or web address]. After hours, contact General Neurology

## 2016-04-20 NOTE — Evaluation (Signed)
Physical Therapy Evaluation Patient Details Name: Stephanie [name] MRN: [id number] DOB: 29-Nov-1965 Today's Date: 04/20/2016   History of Present Illness  Stephanie B Phillipsis an 50 y.o.femalewho presented to [place] with a complaint of diffuse pain as well as headache. She has a known history of hepatitis C,sciatica, recent history of congestive heart failure, and drug seeking behavior. She presented to the emergency room with a complaint of headache and diffuse pain. A CT of the brain was performed and revealed the presence of a large left parieto-occipital hematoma with significant intraventricular extension. She was transferred to [facility] for more definitive care. Patient was not administered IV t-PA secondary to ICH. She was admitted to the neuro ICU for further evaluation and treatment.Pt with right carpal tunnel release and De Quervain release on 04/15/16.  Has splint on right UE.    Clinical Impression  Pt admitted with above diagnosis. Pt currently with functional limitations due to the deficits listed below (see PT Problem List). Pt was able to ambulate on unit.  Slightly unsteady gait.  Pt very confused and impulsive.  Pt will need 24 hour care at home and unsure that husband can provide. Will follow acutely. Pt will benefit from skilled PT to increase their independence and safety with mobility to allow discharge to the venue listed below.      Follow Up Recommendations SNF;Supervision/Assistance - 24 hour    Equipment Recommendations  None recommended by PT    Recommendations for Other Services       Precautions / Restrictions Precautions Precautions: Other (comment) (wrist) Required Braces or Orthoses: Other Brace/Splint (ace wrap/splint on right UE) Restrictions Weight Bearing Restrictions: Yes RUE Weight Bearing: Weight bear through elbow only      Mobility  Bed Mobility Overal bed mobility: Needs Assistance;+ 2 for safety/equipment Bed Mobility: Supine to  Sit     Supine to sit: Supervision     General bed mobility comments: Steadying assist needed  Transfers Overall transfer level: Needs assistance Equipment used: 2 person hand held assist Transfers: Sit to/from Stand Sit to Stand: Min assist;+2 safety/equipment         General transfer comment: Pt needed assist to steady once up.  Ambulation/Gait Ambulation/Gait assistance: Min assist;+2 safety/equipment Ambulation Distance (Feet): 400 Feet Assistive device: 2 person hand held assist Gait Pattern/deviations: Step-through pattern;Decreased stride length;Drifts right/left   Gait velocity interpretation: <1.8 ft/sec, indicative of risk for recurrent falls General Gait Details: Pt generally unsteady with ambulation.  No significant LOB however needed +2 assist for safety due to confusion and impulsivitiy.   Stairs            Wheelchair Mobility    Modified Rankin (Stroke Patients Only) Modified Rankin (Stroke Patients Only) Pre-Morbid Rankin Score: Slight disability Modified Rankin: Moderately severe disability     Balance Overall balance assessment: Needs assistance;History of Falls Sitting-balance support: No upper extremity supported;Feet supported Sitting balance-Leahy Scale: Fair     Standing balance support: Bilateral upper extremity supported;During functional activity Standing balance-Leahy Scale: Poor Standing balance comment: relies on UE support for [email or web address].e              High level balance activites: Direction changes;Turns;Sudden stops High Level Balance Comments: min guard assist with challenges.              Pertinent Vitals/Pain Pain Assessment: No/denies pain  96% RA, Other VSS.     Home Living Family/patient expects to be discharged to:: Private residence Living Arrangements: Spouse/significant other Available  Help at Discharge: Available PRN/intermittently             Additional Comments: Unable to ask questions as pt was  very confused.     Prior Function Level of Independence: Independent               Hand Dominance   Dominant Hand: Right    Extremity/Trunk Assessment   Upper Extremity Assessment: Defer to OT evaluation           Lower Extremity Assessment: Generalized weakness      Cervical / Trunk Assessment: Normal  Communication   Communication: Expressive difficulties  Cognition Arousal/Alertness: Awake/alert Behavior During Therapy: Anxious Overall Cognitive Status: Impaired/Different from baseline Area of Impairment: Orientation;Following commands;Safety/judgement;Awareness;Problem solving;Memory Orientation Level: Disoriented to;Place;Time;Situation   Memory: Decreased short-term memory Following Commands: Follows one step commands inconsistently;Follows one step commands with increased time Safety/Judgement: Decreased awareness of safety;Decreased awareness of deficits Awareness:  (Pre-intellectual) Problem Solving: Decreased initiation;Difficulty sequencing;Requires verbal cues;Requires tactile cues      General Comments      Exercises        Assessment/Plan    PT Assessment Patient needs continued PT services  PT Diagnosis Generalized weakness   PT Problem List Decreased activity tolerance;Decreased mobility;Decreased balance;Decreased knowledge of use of DME;Decreased safety awareness;Decreased knowledge of precautions;Decreased coordination;Decreased cognition  PT Treatment Interventions DME instruction;Gait training;Functional mobility training;Therapeutic activities;Therapeutic exercise;Balance training;Patient/family education;Stair training   PT Goals (Current goals can be found in the Care Plan section) Acute Rehab PT Goals Patient Stated Goal: to get better PT Goal Formulation: Patient unable to participate in goal setting Time For Goal Achievement: 05/04/16 Potential to Achieve Goals: Good    Frequency Min 3X/week   Barriers to discharge  Decreased caregiver support      Co-evaluation               End of Session Equipment Utilized During Treatment: Gait belt Activity Tolerance: Patient limited by fatigue Patient left: in bed;with call bell/phone within reach;with bed alarm set Nurse Communication: Mobility status; pt pulled her catheter out at end of session.  Nurse made aware.          Time: 2841-32441013-1033 PT Time Calculation (min) (ACUTE ONLY): 20 min   Charges:   PT Evaluation $PT Eval Moderate Complexity: 1 Procedure     PT G CodesBerline Lopes:        Phoenix Dresser F 04/20/2016, 12:56 PM Bobbiejo Ishikawa,PT Acute Rehabilitation [phone] (pager)

## 2016-04-21 LAB — COMPREHENSIVE METABOLIC PANEL
ALK PHOS: 92 U/L (ref 38–126)
ALT: 36 U/L (ref 14–54)
ANION GAP: 16 — AB (ref 5–15)
AST: 25 U/L (ref 15–41)
Albumin: 4.4 g/dL (ref 3.5–5.0)
BILIRUBIN TOTAL: 1 mg/dL (ref 0.3–1.2)
BUN: 15 mg/dL (ref 6–20)
CALCIUM: 9.9 mg/dL (ref 8.9–10.3)
CO2: 30 mmol/L (ref 22–32)
CREATININE: 0.64 mg/dL (ref 0.44–1.00)
Chloride: 87 mmol/L — ABNORMAL LOW (ref 101–111)
Glucose, Bld: 190 mg/dL — ABNORMAL HIGH (ref 65–99)
Potassium: 3 mmol/L — ABNORMAL LOW (ref 3.5–5.1)
Sodium: 133 mmol/L — ABNORMAL LOW (ref 135–145)
TOTAL PROTEIN: 8.1 g/dL (ref 6.5–8.1)

## 2016-04-21 LAB — CBC
HCT: 45.6 % (ref 36.0–46.0)
HEMOGLOBIN: 15.3 g/dL — AB (ref 12.0–15.0)
MCH: 31.2 pg (ref 26.0–34.0)
MCHC: 33.6 g/dL (ref 30.0–36.0)
MCV: 92.9 fL (ref 78.0–100.0)
PLATELETS: 250 10*3/uL (ref 150–400)
RBC: 4.91 MIL/uL (ref 3.87–5.11)
RDW: 13 % (ref 11.5–15.5)
WBC: 11.6 10*3/uL — AB (ref 4.0–10.5)

## 2016-04-21 MED ORDER — LABETALOL HCL 5 MG/ML IV SOLN
10.0000 mg | INTRAVENOUS | Status: DC | PRN
  Administered 2016-04-22 – 2016-04-28 (×5): 10 mg via INTRAVENOUS
  Filled 2016-04-21 (×5): qty 4

## 2016-04-21 MED ORDER — BUSPIRONE HCL 10 MG PO TABS
10.0000 mg | ORAL_TABLET | Freq: Three times a day (TID) | ORAL | Status: DC
  Administered 2016-04-21 – 2016-04-30 (×26): 10 mg via ORAL
  Filled 2016-04-21 (×30): qty 1

## 2016-04-21 MED ORDER — GLIPIZIDE 5 MG PO TABS
5.0000 mg | ORAL_TABLET | Freq: Every day | ORAL | Status: DC
  Administered 2016-04-22 – 2016-04-24 (×3): 5 mg via ORAL
  Filled 2016-04-21 (×4): qty 1

## 2016-04-21 MED ORDER — AMLODIPINE BESYLATE 10 MG PO TABS
10.0000 mg | ORAL_TABLET | Freq: Every day | ORAL | Status: DC
  Administered 2016-04-21 – 2016-04-30 (×10): 10 mg via ORAL
  Filled 2016-04-21 (×11): qty 1

## 2016-04-21 MED ORDER — ALPRAZOLAM 0.5 MG PO TABS
1.0000 mg | ORAL_TABLET | Freq: Three times a day (TID) | ORAL | Status: DC | PRN
  Administered 2016-04-22 – 2016-04-24 (×5): 1 mg via ORAL
  Filled 2016-04-21 (×5): qty 2

## 2016-04-21 MED ORDER — POTASSIUM CHLORIDE CRYS ER 20 MEQ PO TBCR
20.0000 meq | EXTENDED_RELEASE_TABLET | Freq: Two times a day (BID) | ORAL | Status: AC
  Administered 2016-04-21 – 2016-04-22 (×4): 20 meq via ORAL
  Filled 2016-04-21 (×4): qty 1

## 2016-04-21 MED ORDER — NICARDIPINE HCL IN NACL 20-0.86 MG/200ML-% IV SOLN
3.0000 mg/h | INTRAVENOUS | Status: DC
  Administered 2016-04-21: 3 mg/h via INTRAVENOUS
  Administered 2016-04-21 (×2): 5 mg/h via INTRAVENOUS
  Filled 2016-04-21 (×3): qty 200

## 2016-04-21 NOTE — Progress Notes (Signed)
No issues overnight. Minimal HA.  EXAM:  BP 136/87   Pulse (!) 106   Temp 97.8 F (36.6 C) (Oral)   Resp 15   Ht 5\' 3"  (1.6 m)   Wt 94.4 kg (208 lb 1.8 oz)   SpO2 93%   BMI 36.87 kg/m   Awake, alert Speech fluent, cont to have difficulty with naming/repetition CN grossly intact  5/5 BUE/BLE   IMPRESSION:  50 y.o. female with hypertensive left parietal IPH, has remained neurologically stable. Will not need EVD or surgical intervention  PLAN: - Cont supportive care per neurology - Please call with any further questions.

## 2016-04-21 NOTE — Progress Notes (Signed)
STROKE TEAM PROGRESS NOTE   HISTORY OF PRESENT ILLNESS (per record) Stephanie [name] is an 50 y.o. female who presented to [facility] with a complaint of diffuse pain as well as headache. She has a known history of hepatitis C, sciatica, recent history of congestive heart failure, and drug seeking behavior. She presented to the emergency room with a complaint of headache and diffuse pain. A CT of the brain was performed and revealed the presence of a large left parieto-occipital hematoma with significant intraventricular extension. She was transferred to [facility] for more definitive care. Patient was not administered IV t-PA secondary to ICH. She was admitted to the neuro ICU for further evaluation and treatment.   SUBJECTIVE (INTERVAL HISTORY) No major changes.  BP is better controlled on Cardene gtt 3 mg/hr.  No headache.  Neurosurgery recommends no EVD or intervention needed.     OBJECTIVE Temp:  [97.7 F (36.5 C)-98.8 F (37.1 C)] 97.7 F (36.5 C) (08/13 0400) Pulse Rate:  [85-114] 113 (08/13 0730) Cardiac Rhythm: Sinus tachycardia (08/12 2000) Resp:  [0-27] 11 (08/13 0730) BP: (110-187)/(64-125) 116/80 (08/13 0730) SpO2:  [87 %-99 %] 92 % (08/13 0730)  Awake, alert. Disoriented to year, month, date, day, place, president. Language-fluent Comprehension- impaired with complex commands. Naming-impaired. Repetition-intact. PERL, EOMI, face symmetrical, tongue midline. Strength 5/5 No visual field cuts.      CBC:   Recent Labs Lab 04/18/16 1226 04/21/16 0247  WBC 8.8 11.6*  NEUTROABS 5.4  --   HGB 15.2 15.3*  HCT 44.1 45.6  MCV 92.7 92.9  PLT 192 250    Basic Metabolic Panel:   Recent Labs Lab 04/18/16 1226 04/21/16 0247  NA 138 133*  K 3.5 3.0*  CL 103 87*  CO2 24 30  GLUCOSE 191* 190*  BUN 10 15  CREATININE 0.54 0.64  CALCIUM 9.4 9.9    Lipid Panel: No results found for: CHOL, TRIG, HDL, CHOLHDL, VLDL, LDLCALC HgbA1c: No results found for:  HGBA1C Urine Drug Screen: No results found for: LABOPIA, COCAINSCRNUR, LABBENZ, AMPHETMU, THCU, LABBARB    IMAGING  Ct Head Wo Contrast 04/18/2016 No significant change since the prior study with 2.8 x 4.1 cm left parietal intraparenchymal hemorrhage with intraventricular extension and 2 mm left to right midline shift with some effacement of the left basilar cisterns.    Ct Head Wo Contrast 04/18/2016 Large intraparenchymal hemorrhage is noted in left posterior parietal cortex with extension into the left lateral and third ventricles. Small amount of left-sided subarachnoid hemorrhage is noted. 4 mm of left-to-right midline shift is noted.     Mr Maxine Glenn Head Wo Contrast 04/20/2016   MRI HEAD  1. Similar size and appearance of acute right temporal occipital intraparenchymal hematoma measuring 4.0 x 2.8 x 2.7 cm (estimated volume 15 cc). Similar localized edema with trace 3 mm left-to-right shift.  2. Intraventricular extension with blood in the lateral and third ventricles. No evidence for hydrocephalus or ventricular trapping.  3. Small volume acute subarachnoid hemorrhage within the posterior cerebral hemispheres bilaterally, stable from prior CT.  4. Trace left subdural hemorrhage measuring up to 2 mm, difficult to visualize on prior study, but felt to be unchanged.   MRA HEAD  1. Negative intracranial MRA. No vascular abnormality identified underlying the acute hemorrhage.  2. No large or proximal arterial branch occlusion. No high-grade or correctable stenosis.  3. Multi focal atheromatous irregularity involving the anterior posterior circulations as above, predominantly involving the distal small vessels.  Dg Chest Portable 1 View 04/18/2016 No acute disease. Atherosclerosis.    PHYSICAL EXAM Obese middle aged caucasian lady not in distress. . Afebrile. Head is nontraumatic. Neck is supple without bruit.    Cardiac exam no murmur or gallop. Lungs are clear to auscultation.  Distal pulses are well felt. Neurological Exam :  Awake alert disoriented to month, date, but knows year and day.  Unable to name the current president or name the hospital. Some verbal perseveration. Fluent when speaks, but comprehension is impaired about 50%, including with complex multistep commands. Naming is impaired. Repetition is normal.  Pupils equal reactive. Fundi were not visualized. Right homonymous hemianopsia and right-sided visual neglect. Slight right lower facial asymmetry when she smiles. Tongue midline. Right hemiplegia with 4/5 right upper extremity strength but this is limited due to the carpal tunnel surgery bandage on the right forearm. Mild right lower extremity drift. Weakness of right hip flexors and ankle dorsiflexors. Sensation is slightly diminished on the right compared to the left. Right plantar upgoing left downgoing. Gait was not tested.     ASSESSMENT/PLAN Hospital day # 3     Hypertensive hemorrhage.  BP is better controlled on Cardene gtt.    She has a transcortical sensory aphasia.    No visual field cuts appreciated today.    Cont supportive care, to include speech therapy which would be instrumental in helping her improve her aphasia.      Weston SettleShervin Aquilla Shambley, MD 04/21/2016 7:57 AM    To contact Stroke Continuity provider, please refer to [email or web address]. After hours, contact General Neurology

## 2016-04-22 DIAGNOSIS — I10 Essential (primary) hypertension: Secondary | ICD-10-CM

## 2016-04-22 DIAGNOSIS — G9341 Metabolic encephalopathy: Secondary | ICD-10-CM

## 2016-04-22 DIAGNOSIS — E1159 Type 2 diabetes mellitus with other circulatory complications: Secondary | ICD-10-CM

## 2016-04-22 DIAGNOSIS — I610 Nontraumatic intracerebral hemorrhage in hemisphere, subcortical: Secondary | ICD-10-CM

## 2016-04-22 LAB — CBC
HCT: 43.9 % (ref 36.0–46.0)
Hemoglobin: 14.9 g/dL (ref 12.0–15.0)
MCH: 31.2 pg (ref 26.0–34.0)
MCHC: 33.9 g/dL (ref 30.0–36.0)
MCV: 91.8 fL (ref 78.0–100.0)
PLATELETS: 230 10*3/uL (ref 150–400)
RBC: 4.78 MIL/uL (ref 3.87–5.11)
RDW: 12.7 % (ref 11.5–15.5)
WBC: 11 10*3/uL — AB (ref 4.0–10.5)

## 2016-04-22 LAB — BASIC METABOLIC PANEL
Anion gap: 11 (ref 5–15)
BUN: 13 mg/dL (ref 6–20)
CALCIUM: 9.3 mg/dL (ref 8.9–10.3)
CHLORIDE: 93 mmol/L — AB (ref 101–111)
CO2: 30 mmol/L (ref 22–32)
CREATININE: 0.66 mg/dL (ref 0.44–1.00)
Glucose, Bld: 194 mg/dL — ABNORMAL HIGH (ref 65–99)
Potassium: 3.4 mmol/L — ABNORMAL LOW (ref 3.5–5.1)
SODIUM: 134 mmol/L — AB (ref 135–145)

## 2016-04-22 LAB — LIPID PANEL
CHOL/HDL RATIO: 3 ratio
CHOLESTEROL: 118 mg/dL (ref 0–200)
HDL: 40 mg/dL — AB (ref >40)
LDL Cholesterol: 60 mg/dL (ref 0–99)
TRIGLYCERIDES: 91 mg/dL (ref <150)
VLDL: 18 mg/dL (ref 0–40)

## 2016-04-22 LAB — GLUCOSE, CAPILLARY: Glucose-Capillary: 207 mg/dL — ABNORMAL HIGH (ref 65–99)

## 2016-04-22 MED ORDER — INSULIN ASPART 100 UNIT/ML ~~LOC~~ SOLN
0.0000 [IU] | Freq: Every day | SUBCUTANEOUS | Status: DC
  Administered 2016-04-22: 2 [IU] via SUBCUTANEOUS
  Administered 2016-04-25: 3 [IU] via SUBCUTANEOUS

## 2016-04-22 MED ORDER — ENOXAPARIN SODIUM 40 MG/0.4ML ~~LOC~~ SOLN
40.0000 mg | SUBCUTANEOUS | Status: DC
  Administered 2016-04-22 – 2016-04-24 (×3): 40 mg via SUBCUTANEOUS
  Filled 2016-04-22 (×4): qty 0.4

## 2016-04-22 MED ORDER — ACETAMINOPHEN 325 MG PO TABS
650.0000 mg | ORAL_TABLET | Freq: Four times a day (QID) | ORAL | Status: DC | PRN
  Administered 2016-04-22 – 2016-04-27 (×9): 650 mg via ORAL
  Filled 2016-04-22 (×9): qty 2

## 2016-04-22 MED ORDER — INSULIN ASPART 100 UNIT/ML ~~LOC~~ SOLN
0.0000 [IU] | Freq: Three times a day (TID) | SUBCUTANEOUS | Status: DC
  Administered 2016-04-23: 2 [IU] via SUBCUTANEOUS
  Administered 2016-04-23: 8 [IU] via SUBCUTANEOUS
  Administered 2016-04-24: 3 [IU] via SUBCUTANEOUS
  Administered 2016-04-24: 5 [IU] via SUBCUTANEOUS
  Administered 2016-04-24: 3 [IU] via SUBCUTANEOUS
  Administered 2016-04-25: 2 [IU] via SUBCUTANEOUS
  Administered 2016-04-25: 3 [IU] via SUBCUTANEOUS
  Administered 2016-04-26: 11 [IU] via SUBCUTANEOUS
  Administered 2016-04-26 (×2): 3 [IU] via SUBCUTANEOUS
  Administered 2016-04-27: 2 [IU] via SUBCUTANEOUS
  Administered 2016-04-27 – 2016-04-29 (×6): 3 [IU] via SUBCUTANEOUS
  Administered 2016-04-30: 2 [IU] via SUBCUTANEOUS

## 2016-04-22 NOTE — Progress Notes (Signed)
Physical Therapy Treatment Patient Details Name: Elayne GuerinDianna B Tal MRN: [id number] DOB: 1966-05-13 Today's Date: 04/22/2016    History of Present Illness Marielis B Phillipsis an 50 y.o.femalewho presented to [place] with a complaint of diffuse pain as well as headache. She has a known history of hepatitis C,sciatica, recent history of congestive heart failure, and drug seeking behavior. She presented to the emergency room with a complaint of headache and diffuse pain. A CT of the brain was performed and revealed the presence of a large left parieto-occipital hematoma with significant intraventricular extension. [facility] was [facility] to [facility] care. Patient was not administered IV t-PA secondary to ICH. She was admitted to the neuro ICU for further evaluation and treatment.Pt with right carpal tunnel release and De Quervain release on 04/15/16.  Has splint on right UE.      PT Comments    Pt is progressing with gait stability, but still has significant awareness deficits.  She is at moderate risk of falls especially when multi tasking or preforming tasks that require speed (turning and stopping).  PT will continue to follow acutely to work on gait stability and higher level dynamic balance tasks.    Follow Up Recommendations  SNF;Supervision/Assistance - 24 hour     Equipment Recommendations  None recommended by PT    Recommendations for Other Services   NA     Precautions / Restrictions Precautions Precautions: Fall Required Braces or Orthoses: Other Brace/Splint Other Brace/Splint: R wrist splint Restrictions RUE Weight Bearing: Weight bear through elbow only    Mobility  Bed Mobility               General bed mobility comments: Pt standing with RN on the way back from the bathroom in the room.   Transfers Overall transfer level: Needs assistance Equipment used: None Transfers: Sit to/from Stand Sit to Stand: Min assist          General transfer comment: Min assist to ensure that she is hitting her entended sitting surface target.    Ambulation/Gait Ambulation/Gait assistance: Min assist Ambulation Distance (Feet): 260 Feet Assistive device: None Gait Pattern/deviations: Step-through pattern;Staggering left;Staggering right (runs into obstacles on her right) Gait velocity: decreased Gait velocity interpretation: Below normal speed for age/gender General Gait Details: Pt is unsteady on her feet, unaware of her deficts and running into right sided obstacles.  She moves quickly before she has her balance and does poorly wtih sudden stops or movements.        Modified Rankin (Stroke Patients Only) Modified Rankin (Stroke Patients Only) Pre-Morbid Rankin Score: Slight disability Modified Rankin: Moderately severe disability     Balance Overall balance assessment: Needs assistance Sitting-balance support: Feet supported;Bilateral upper extremity supported;No upper extremity supported Sitting balance-Leahy Scale: Fair     Standing balance support: Bilateral upper extremity supported;No upper extremity supported;Single extremity supported Standing balance-Leahy Scale: Poor                      Cognition Arousal/Alertness: Awake/alert Behavior During Therapy: Restless;Impulsive Overall Cognitive Status: Impaired/Different from baseline Area of Impairment: Attention;Memory;Following commands;Safety/judgement;Awareness;Problem solving;[place] Recovery Scale;Orientation Orientation Level:  Patrcia Dolly("Bryant" "stroke") Current Attention Level: Sustained Memory: Decreased short-term memory;Decreased recall of precautions Following Commands: Follows one step commands consistently Safety/Judgement: Decreased awareness of safety;Decreased awareness of deficits Awareness: Intellectual Problem Solving: Difficulty sequencing;Requires tactile cues;Requires verbal cues General Comments: Pt reports to me that she has no  balance deficits and feels very steady on her  feet.  She, in fact, does have balance deficits and is very unsteady on her feet.            Pertinent Vitals/Pain Pain Assessment: 0-10 Pain Score: 7  Faces Pain Scale: Hurts a little bit Pain Location: head Pain Descriptors / Indicators: Aching Pain Intervention(s): Limited activity within patient's tolerance;Monitored during session;Repositioned           PT Goals (current goals can now be found in the care plan section) Acute Rehab PT Goals Patient Stated Goal: to get better Progress towards PT goals: Progressing toward goals    Frequency  Min 3X/week    PT Plan Current plan remains appropriate    Co-evaluation PT/OT/SLP Co-Evaluation/Treatment: Yes Reason for Co-Treatment: Necessary to address cognition/behavior during functional activity PT goals addressed during session: Mobility/safety with mobility;Balance       End of Session Equipment Utilized During Treatment: Gait belt Activity Tolerance: Patient tolerated treatment well Patient left: Other (comment) (seated EOB working with OT)     Time: 4098-1191: 1340-1351 PT Time Calculation (min) (ACUTE ONLY): 11 min  Charges:  $Gait Training: 8-22 mins                      Valissa Lyvers B. Laelyn Blumenthal, PT, DPT [phone]   04/22/2016, 2:17 PM

## 2016-04-22 NOTE — Progress Notes (Signed)
STROKE TEAM PROGRESS NOTE   SUBJECTIVE (INTERVAL HISTORY) Patient sitting up in chair. Cardene turn off at 2 AM. However, pt still confused with perseveration. Asterixis bilaterally. But no focal deficit. BP stable.    OBJECTIVE Temp:  [97.5 F (36.4 C)-99.1 F (37.3 C)] 97.5 F (36.4 C) (08/14 0400) Pulse Rate:  [48-135] 63 (08/14 0800) Cardiac Rhythm: Normal sinus rhythm (08/14 0800) Resp:  [9-24] 18 (08/14 0800) BP: (111-157)/(65-119) 143/91 (08/14 0800) SpO2:  [86 %-100 %] 89 % (08/14 0800)  CBC:   Recent Labs Lab 04/18/16 1226 04/21/16 0247 04/22/16 0357  WBC 8.8 11.6* 11.0*  NEUTROABS 5.4  --   --   HGB 15.2 15.3* 14.9  HCT 44.1 45.6 43.9  MCV 92.7 92.9 91.8  PLT 192 250 230    Basic Metabolic Panel:   Recent Labs Lab 04/21/16 0247 04/22/16 0357  NA 133* 134*  K 3.0* 3.4*  CL 87* 93*  CO2 30 30  GLUCOSE 190* 194*  BUN 15 13  CREATININE 0.64 0.66  CALCIUM 9.9 9.3    Lipid Panel:     Component Value Date/Time   CHOL 118 04/22/2016 0357   TRIG 91 04/22/2016 0357   HDL 40 (L) 04/22/2016 0357   CHOLHDL 3.0 04/22/2016 0357   VLDL 18 04/22/2016 0357   LDLCALC 60 04/22/2016 0357    IMAGING I have personally reviewed the radiological images below and agree with the radiology interpretations.  Ct Head Wo Contrast 04/18/2016 No significant change since the prior study with 2.8 x 4.1 cm left parietal intraparenchymal hemorrhage with intraventricular extension and 2 mm left to right midline shift with some effacement of the left basilar cisterns.   Ct Head Wo Contrast 04/18/2016 Large intraparenchymal hemorrhage is noted in left posterior parietal cortex with extension into the left lateral and third ventricles. Small amount of left-sided subarachnoid hemorrhage is noted. 4 mm of left-to-right midline shift is noted.   MRI HEAD  04/20/2016 1. Similar size and appearance of acute right temporal occipital intraparenchymal hematoma measuring 4.0 x 2.8 x 2.7  cm (estimated volume 15 cc). Similar localized edema with trace 3 mm left-to-right shift.  2. Intraventricular extension with blood in the lateral and third ventricles. No evidence for hydrocephalus or ventricular trapping.  3. Small volume acute subarachnoid hemorrhage within the posterior cerebral hemispheres bilaterally, stable from prior CT.  4. Trace left subdural hemorrhage measuring up to 2 mm, difficult to visualize on prior study, but felt to be unchanged.   MRA HEAD  04/20/2016 1. Negative intracranial MRA. No vascular abnormality identified underlying the acute hemorrhage.  2. No large or proximal arterial branch occlusion. No high-grade or correctable stenosis.  3. Multi focal atheromatous irregularity involving the anterior posterior circulations as above, predominantly involving the distal small vessels.   2D Echocardiogram  - Procedure narrative: Transthoracic echocardiography. Image quality was poor. The study was technically difficult, as a result of poor sound wave transmission.  - Left ventricle: The cavity size was normal. Wall thickness was increased in a pattern of mild LVH. There was mild focal basal hypertrophy of the septum. Systolic function was vigorous. The estimated ejection fraction was in the range of 65% to 70%. Wall motion was normal; there were no regional wall motion abnormalities. Doppler parameters are consistent with abnormal left ventricular relaxation (grade 1 diastolic dysfunction). Impressions:   Vigorous LV systolic function; grade 1 diastolic dysfunction.   PHYSICAL EXAM Obese middle aged caucasian lady not in distress. Afebrile. Head is  nontraumatic. Neck is supple without bruit. Cardiac exam no murmur or gallop. Lungs are clear to auscultation. Distal pulses are well felt. Neurological Exam :  Awake alert disoriented to month, date, but knows year and day. Unable to name the current president or name the hospital. Significant verbal perseveration.  Fluent when speaks, but comprehension is impaired, including with complex multistep commands. Naming is impaired with perseveration. Repetition is normal.  Pupils equal reactive. Fundi were not visualized. Visual field appears to be intact although slow reaction. Facial symmetrical. Tongue midline. Motor symmetrical bilaterally 4/5, but with significant bilateral asterixis. Sensation symmetrical. DTR 1+, no babinski. Coordination intact but slow on the right. Gait was not tested.   ASSESSMENT/PLAN [name] is a 50 y.o. female with history of hepatitis C,sciatica, CHF, and drug seeking behavior presenting to [facility] with complaints of diffuse pain and HA. CT showed a moderate L parieto-occipital hemorrhage with IVH. [facility]  ICH:  moderate left parieto-occipital hemorrhage with resultant IVH, SAH, likely hypertensive etiology  Resultant: Perseveration and asterixis   Neurosurgery consult - no surgery or IVC, manage BP - signed off 04/22/2016  CT at [place] moderate L posterior parietal cortex hemorrhage with IVH, small SAH L temporal and posterior parietal. 4mm L shift  Repeat CT head no significant change, 2mm L shift with some effacement of L basilar cistern  MRI without contrast - acute left temporal occipital intraparenchymal hematoma, L to R shift (3mm), IVH, small volume SAH, SDH  MRA - negative  LDL 60  HgbA1c pending   2D Echo - EF 65-70%. No cardiac source of emboli identified.  SCDs changed to lovenox 40 mg daily for VTE prophylaxis  Diet regular Room service appropriate? Yes; Fluid consistency: Thin   BC Powders prior to admission  Ongoing aggressive stroke risk factor management  Therapy recommendations:  SNF. Social work consulted  Disposition:  pending   Transfer to floor, telemetry  Metabolic encephalopathy  Associated with ICH and hypertension  Perseveration and asterixis  Treat for the  cause  Hypertensive Emergency  BP 203/116 on arrival to [place] in setting of neurologic symptoms  Placed on cardene drip. Stopped at 2a today.  Now on : norvasc 10 qd, losartan 50 bid   SBP goal < 180  BP within goal  Long-term BP goal normotensive  Diabetes  HgbA1c pending, goal < 7.0  CBG monitoring  On SSI  On glucotrol  Tobacco abuse  Current smoker  Smoking cessation counseling provided  Pt is willing to quit  Other Stroke Risk Factors  Obesity, Body mass index is 36.87 kg/m.  recommend weight loss, diet and exercise as appropriate   Congestive Heart Failure  Hypokalemia 3.4, started yesterday with 20mEq bid  Hospital day # 4  This patient is critically ill due to left ICH with IVH, hypertensive emergency needing Cardene, metabolic encephalopathy and at significant risk of neurological worsening, death form hematoma extension, brain edema, cerebral herniation and seizure. This patient's care requires constant monitoring of vital signs, hemodynamics, respiratory and cardiac monitoring, review of multiple databases, neurological assessment, discussion with family, other specialists and medical decision making of high complexity. I spent 40 minutes of neurocritical care time in the care of this patient.  Marvel PlanJindong Treazure Nery, MD PhD Stroke Neurology 04/22/2016 6:28 PM     To contact Stroke Continuity provider, please refer to [email or web address]. After hours, contact General Neurology

## 2016-04-22 NOTE — Progress Notes (Signed)
Speech Language Pathology Treatment: Dysphagia;Cognitive-Linquistic  Patient Details Name: Stephanie [name] MRN: [id number] DOB: [birth year] Today's Date: 04/22/2016 Time: [id number] SLP Time Calculation (min) (ACUTE ONLY): 22 min  Assessment / Plan / Recommendation Clinical Impression  Dysphagia and cognitive treatment during lunch requiring min assist-mod verbal/tactile assist to locate and open packages and smaller bite size. No pocketing or s/s aspiration present. Followed one step commands during self feeding with 100% visual supervision. She needed mod-max verbal cues to problem and generate alternate solutions to open salad dressing packet. Mod-max cues to orient to place, no cues for situational orientation. Pt able to locate items on the right of her tray and significant visual deficits with OT.    HPI HPI: Stephanie B Phillipsis an 50 y.o.femalewho presented to [facility] with a complaint of diffuse pain as well as headache. She has a known history of hepatitis C,sciatica, recent history of congestive heart failure, and drug seeking behavior. She presented to the emergency room with a complaint of headache and diffuse pain. A CT of the brain was performed and revealed the presence of a large left parieto-occipital hematoma with significant intraventricular extension.      SLP Plan  Continue with current plan of care     Recommendations  Diet recommendations: Regular;Thin liquid Liquids provided via: Cup;Straw Medication Administration: Whole meds with liquid Supervision: Patient able to self feed;Intermittent supervision to cue for compensatory strategies Compensations: Slow rate;Small sips/bites;Minimize environmental distractions Postural Changes and/or Swallow Maneuvers: Seated upright 90 degrees             Oral Care Recommendations: Oral care BID Follow up Recommendations: Inpatient Rehab Plan: Continue with current plan of care     GO                Royce MacadamiaLitaker, Kurstyn Larios  Willis 04/22/2016, 1:44 PM  Breck CoonsLisa Willis Lonell FaceLitaker M.Ed [employer]-SLP Pager [phone]

## 2016-04-22 NOTE — Plan of Care (Signed)
Problem: Education: Goal: Knowledge of patient specific risk factors addressed and post discharge goals established will improve Outcome: Completed/Met Date Met: 04/22/16 Discussed smoking cessation

## 2016-04-22 NOTE — Plan of Care (Signed)
Problem: Nutrition: Goal: Risk of aspiration will decrease Outcome: Completed/Met Date Met: 04/22/16 Speech/swallow consult.  No concerns regarding ability to swallow or aspiration

## 2016-04-22 NOTE — Evaluation (Signed)
Occupational Therapy Evaluation Patient Details Name: Stephanie [name] MRN: [id number] DOB: 12/04/1965 Today's Date: 04/22/2016    History of Present Illness Stephanie B Phillipsis an 50 y.o.femalewho presented to [facility] with a complaint of diffuse pain as well as headache. She has a known history of hepatitis C,sciatica, recent history of congestive heart failure, and drug seeking behavior. She presented to the emergency room with a complaint of headache and diffuse pain. A CT of the brain was performed and revealed the presence of a large left parieto-occipital hematoma with significant intraventricular extension. She was transferred to [facility] for more definitive care. Patient was not administered IV t-PA secondary to ICH. She was admitted to the neuro ICU for further evaluation and treatment.Pt with right carpal tunnel release and [place] release on 04/15/16.  Has splint on right UE.     Clinical Impression   Pt reports she was independent with ADL PTA. Currently pt is overall min assist for ADL and functional mobility due to balance deficits and impulsivity. Pt presenting with R visual field deficits, cognitive impairments, and decreased awareness of safety and insights into her deficits her independence and safety with ADL and functional mobility. Recommend SNF for follow up in order to maximize independence and safety with ADL and functional mobility prior to return home. Pt would benefit from continued skilled OT to address established goals.    Follow Up Recommendations  SNF;Supervision/Assistance - 24 hour    Equipment Recommendations  Other (comment) (TBD at next venue)    Recommendations for Other Services       Precautions / Restrictions Precautions Precautions: Fall Required Braces or Orthoses: Other Brace/Splint Other Brace/Splint: R wrist splint Restrictions Weight Bearing Restrictions: Yes RUE Weight Bearing: Weight bear through elbow only       Mobility Bed Mobility Overal bed mobility: Needs Assistance Bed Mobility: Sit to Supine       Sit to supine: Supervision   General bed mobility comments: Supervision for safety. Pt very impulsive and unsteady.  Transfers Overall transfer level: Needs assistance Equipment used: None Transfers: Sit to/from Stand Sit to Stand: Min assist         General transfer comment: Min assist to ensure that she is hitting her entended sitting surface target.      Balance Overall balance assessment: Needs assistance Sitting-balance support: Feet supported;Bilateral upper extremity supported;No upper extremity supported Sitting balance-Leahy Scale: Fair     Standing balance support: No upper extremity supported;During functional activity Standing balance-Leahy Scale: Poor                   Standardized Balance Assessment Standardized Balance Assessment : Dynamic Gait Index   Dynamic Gait Index Level Surface: Mild Impairment Gait with Horizontal Head Turns: Moderate Impairment Gait with Vertical Head Turns: Moderate Impairment Gait and Pivot Turn: Severe Impairment      ADL Overall ADL's : Needs assistance/impaired Eating/Feeding: Set up;Sitting   Grooming: Standing;Cueing for sequencing;Cueing for safety;Minimal assistance Grooming Details (indicate cue type and reason): Assist for balance in standing Upper Body Bathing: Set up;Supervision/ safety;Sitting   Lower Body Bathing: Sit to/from stand;Minimal assistance   Upper Body Dressing : Standing;Cueing for safety;Cueing for sequencing;Minimal assistance Upper Body Dressing Details (indicate cue type and reason): Max verbal cues to don hospital gown as a jacket. Lower Body Dressing: Sit to/from stand;Minimal assistance Lower Body Dressing Details (indicate cue type and reason): Pt able to pull up socks sitting EOB Toilet Transfer: Minimal assistance;Ambulation;Regular Teacher, adult education  Details (indicate cue type  and reason): Simulated by multiple sit <> stand throughout session and functional mobility in room.         Functional mobility during ADLs: Minimal assistance;Cueing for safety General ADL Comments: Pt very impulsive and unaware of deficits (balance, vision, safety). Pt with difficutly correctly identifying objects, reading or correctly identifying letters. Pt able to snap snaps on hospital with max verbal cues for sequencing and able to don gait belt with max verbal cues.       Vision Vision Assessment?: Yes Eye Alignment: Within Functional Limits Tracking/Visual Pursuits: Able to track stimulus in all quads without difficulty Convergence: Within functional limits Visual Fields: Right visual field deficit Additional Comments: Pt reports she feels like "everything is running together"; did not identify diplopia or increased blurriness. Pt unable to read "occupational therapy" on badge; stated "O, 3, 3,.Marland Kitchen.Marland Kitchen.I just dont know".   Perception     Praxis      Pertinent Vitals/Pain Pain Assessment: 0-10 Pain Score: 7  Faces Pain Scale: Hurts a little bit Pain Location: head Pain Descriptors / Indicators: Aching;Headache Pain Intervention(s): Monitored during session;Repositioned     Hand Dominance Right   Extremity/Trunk Assessment Upper Extremity Assessment Upper Extremity Assessment: RUE deficits/detail;Overall WFL for tasks assessed RUE Deficits / Details: recent carpal tunnel release   Lower Extremity Assessment Lower Extremity Assessment: Defer to PT evaluation   Cervical / Trunk Assessment Cervical / Trunk Assessment: Normal   Communication Communication Communication: No difficulties   Cognition Arousal/Alertness: Awake/alert Behavior During Therapy: Restless;Impulsive Overall Cognitive Status: Impaired/Different from baseline Area of Impairment: Attention;Memory;Following commands;Safety/judgement;Awareness;Problem solving;[place] Recovery Scale;Orientation Orientation  Level:  Stephanie [name]("[place]" "stroke") Current Attention Level: Sustained Memory: Decreased short-term memory;Decreased recall of precautions Following Commands: Follows one step commands consistently Safety/Judgement: Decreased awareness of safety;Decreased awareness of deficits Awareness: Intellectual Problem Solving: Difficulty sequencing;Requires tactile cues;Requires verbal cues General Comments: Pt identifying fork as a "knife"; max cues to correctly identify object.   General Comments       Exercises       Shoulder Instructions      Home Living Family/patient expects to be discharged to:: Private residence Living Arrangements: Spouse/significant other Available Help at Discharge: Available PRN/intermittently Type of Home: House       Home Layout: One level     Bathroom Shower/Tub: Tub/shower unit Shower/tub characteristics: Engineer, building servicesCurtain Bathroom Toilet: Standard     Home Equipment: None   Additional Comments: Pt reports she lives at home with her husband who works 10 hour shifts during the day and her 7 dogs. Per RN; pt and husband are seperated and pt lives with her chidlren 26(17 and 51 y.o.)      Prior Functioning/Environment Level of Independence: Independent             OT Diagnosis: Cognitive deficits;Disturbance of vision;Altered mental status   OT Problem List: Impaired balance (sitting and/or standing);Impaired vision/perception;Decreased cognition;Decreased safety awareness;Pain   OT Treatment/Interventions: Self-care/ADL training;DME and/or AE instruction;Cognitive remediation/compensation;Therapeutic activities;Visual/perceptual remediation/compensation;Patient/family education;Balance training    OT Goals(Current goals can be found in the care plan section) Acute Rehab OT Goals Patient Stated Goal: to get better OT Goal Formulation: With patient Time For Goal Achievement: 05/06/16 Potential to Achieve Goals: Good ADL Goals Pt Will Perform Grooming: with  supervision;standing Pt Will Perform Upper Body Bathing: with supervision;sitting Pt Will Perform Lower Body Bathing: with supervision;sit to/from stand Pt Will Transfer to Toilet: with supervision;ambulating;regular height toilet Pt Will Perform Toileting - Clothing Manipulation and hygiene: with  supervision;sit to/from stand Additional ADL Goal #1: Pt will demonstrate emergent awareness during ADL task with min verbal cues for sequencing. Additional ADL Goal #2: Pt will correctly identify 5/5 objects as precursor to ADL task.  OT Frequency: Min 2X/week   Barriers to D/C:            Co-evaluation PT/OT/SLP Co-Evaluation/Treatment: Yes Reason for Co-Treatment: Necessary to address cognition/behavior during functional activity PT goals addressed during session: Mobility/safety with mobility;Balance OT goals addressed during session: ADL's and self-care      End of Session Equipment Utilized During Treatment: Gait belt Nurse Communication: Mobility status  Activity Tolerance: Patient tolerated treatment well Patient left: in bed;with call bell/phone within reach;with bed alarm set   Time: 1343-1400 OT Time Calculation (min): 17 min Charges:  OT General Charges $OT Visit: 1 Procedure OT Evaluation $OT Eval Moderate Complexity: 1 Procedure G-Codes:     Gaye AlkenBailey A Mikaia Janvier M.S., OTR/L Pager: [phone]  04/22/2016, 3:53 PM

## 2016-04-23 ENCOUNTER — Encounter (HOSPITAL_COMMUNITY): Payer: Self-pay | Admitting: *Deleted

## 2016-04-23 LAB — CBC
HEMATOCRIT: 46.1 % — AB (ref 36.0–46.0)
Hemoglobin: 15.4 g/dL — ABNORMAL HIGH (ref 12.0–15.0)
MCH: 31.4 pg (ref 26.0–34.0)
MCHC: 33.4 g/dL (ref 30.0–36.0)
MCV: 94.1 fL (ref 78.0–100.0)
PLATELETS: 223 10*3/uL (ref 150–400)
RBC: 4.9 MIL/uL (ref 3.87–5.11)
RDW: 13 % (ref 11.5–15.5)
WBC: 8.5 10*3/uL (ref 4.0–10.5)

## 2016-04-23 LAB — GLUCOSE, CAPILLARY
GLUCOSE-CAPILLARY: 146 mg/dL — AB (ref 65–99)
Glucose-Capillary: 254 mg/dL — ABNORMAL HIGH (ref 65–99)
Glucose-Capillary: 80 mg/dL (ref 65–99)
Glucose-Capillary: 198 mg/dL — ABNORMAL HIGH (ref 65–99)

## 2016-04-23 LAB — HEMOGLOBIN A1C
Hgb A1c MFr Bld: 8.1 % — ABNORMAL HIGH (ref 4.8–5.6)
MEAN PLASMA GLUCOSE: 186 mg/dL

## 2016-04-23 LAB — BASIC METABOLIC PANEL
Anion gap: 13 (ref 5–15)
BUN: 14 mg/dL (ref 6–20)
CO2: 28 mmol/L (ref 22–32)
CREATININE: 0.57 mg/dL (ref 0.44–1.00)
Calcium: 9.7 mg/dL (ref 8.9–10.3)
Chloride: 95 mmol/L — ABNORMAL LOW (ref 101–111)
GFR calc Af Amer: >60 mL/min (ref >60)
GLUCOSE: 210 mg/dL — AB (ref 65–99)
POTASSIUM: 3.6 mmol/L (ref 3.5–5.1)
SODIUM: 136 mmol/L (ref 135–145)

## 2016-04-23 LAB — CULTURE, BLOOD (ROUTINE X 2)
Culture: NO GROWTH
Culture: NO GROWTH

## 2016-04-23 MED ORDER — BUTALBITAL-APAP-CAFFEINE 50-325-40 MG PO TABS
1.0000 | ORAL_TABLET | Freq: Three times a day (TID) | ORAL | Status: DC | PRN
  Administered 2016-04-23 – 2016-04-28 (×12): 1 via ORAL
  Filled 2016-04-23 (×12): qty 1

## 2016-04-23 MED ORDER — BUTALBITAL-APAP-CAFFEINE 50-325-40 MG PO TABS: 1.0000 | ORAL_TABLET | Freq: Once | ORAL | Status: DC

## 2016-04-23 NOTE — Progress Notes (Signed)
STROKE TEAM PROGRESS NOTE   SUBJECTIVE (INTERVAL HISTORY) Patient sitting up in chair. Less confused, more awake alert. Still complains of mild headache. Will add Fioricet. Avoid Dilaudid.   OBJECTIVE Temp:  [97.6 F (36.4 C)-98.9 F (37.2 C)] 97.7 F (36.5 C) (08/15 1747) Pulse Rate:  [78-106] 106 (08/15 1747) Cardiac Rhythm: Normal sinus rhythm (08/15 0700) Resp:  [15-20] 20 (08/15 1747) BP: (83-172)/(52-99) 146/94 (08/15 1747) SpO2:  [95 %-99 %] 99 % (08/15 1747) Weight:  [204 lb 12.8 oz (92.9 kg)] 204 lb 12.8 oz (92.9 kg) (08/14 2252)  CBC:   Recent Labs Lab 04/18/16 1226  04/22/16 0357 04/23/16 0734  WBC 8.8  < > 11.0* 8.5  NEUTROABS 5.4  --   --   --   HGB 15.2  < > 14.9 15.4*  HCT 44.1  < > 43.9 46.1*  MCV 92.7  < > 91.8 94.1  PLT 192  < > 230 223  < > = values in this interval not displayed.  Basic Metabolic Panel:   Recent Labs Lab 04/22/16 0357 04/23/16 0734  NA 134* 136  K 3.4* 3.6  CL 93* 95*  CO2 30 28  GLUCOSE 194* 210*  BUN 13 14  CREATININE 0.66 0.57  CALCIUM 9.3 9.7    Lipid Panel:     Component Value Date/Time   CHOL 118 04/22/2016 0357   TRIG 91 04/22/2016 0357   HDL 40 (L) 04/22/2016 0357   CHOLHDL 3.0 04/22/2016 0357   VLDL 18 04/22/2016 0357   LDLCALC 60 04/22/2016 0357    IMAGING I have personally reviewed the radiological images below and agree with the radiology interpretations.  Ct Head Wo Contrast 04/18/2016 No significant change since the prior study with 2.8 x 4.1 cm left parietal intraparenchymal hemorrhage with intraventricular extension and 2 mm left to right midline shift with some effacement of the left basilar cisterns.   Ct Head Wo Contrast 04/18/2016 Large intraparenchymal hemorrhage is noted in left posterior parietal cortex with extension into the left lateral and third ventricles. Small amount of left-sided subarachnoid hemorrhage is noted. 4 mm of left-to-right midline shift is noted.   MRI HEAD   04/20/2016 1. Similar size and appearance of acute right temporal occipital intraparenchymal hematoma measuring 4.0 x 2.8 x 2.7 cm (estimated volume 15 cc). Similar localized edema with trace 3 mm left-to-right shift.  2. Intraventricular extension with blood in the lateral and third ventricles. No evidence for hydrocephalus or ventricular trapping.  3. Small volume acute subarachnoid hemorrhage within the posterior cerebral hemispheres bilaterally, stable from prior CT.  4. Trace left subdural hemorrhage measuring up to 2 mm, difficult to visualize on prior study, but felt to be unchanged.   MRA HEAD  04/20/2016 1. Negative intracranial MRA. No vascular abnormality identified underlying the acute hemorrhage.  2. No large or proximal arterial branch occlusion. No high-grade or correctable stenosis.  3. Multi focal atheromatous irregularity involving the anterior posterior circulations as above, predominantly involving the distal small vessels.   2D Echocardiogram  - Procedure narrative: Transthoracic echocardiography. Image quality was poor. The study was technically difficult, as a result of poor sound wave transmission.  - Left ventricle: The cavity size was normal. Wall thickness was increased in a pattern of mild LVH. There was mild focal basal hypertrophy of the septum. Systolic function was vigorous. The estimated ejection fraction was in the range of 65% to 70%. Wall motion was normal; there were no regional wall motion abnormalities. Doppler parameters  are consistent with abnormal left ventricular relaxation (grade 1 diastolic dysfunction). Impressions:   Vigorous LV systolic function; grade 1 diastolic dysfunction.   PHYSICAL EXAM Obese middle aged caucasian lady not in distress. Afebrile. Head is nontraumatic. Neck is supple without bruit. Cardiac exam no murmur or gallop. Lungs are clear to auscultation. Distal pulses are well felt. Neurological Exam :  Awake alert disoriented to  month, date, but knows year and day. Unable to name the current president or name the hospital. Significant verbal perseveration. Fluent when speaks, but comprehension is impaired, including with complex multistep commands. Naming is impaired with perseveration. Repetition is normal.  Pupils equal reactive. Fundi were not visualized. Visual field appears to be intact although slow reaction. Facial symmetrical. Tongue midline. Motor symmetrical bilaterally 4/5, but with significant bilateral asterixis. Sensation symmetrical. DTR 1+, no babinski. Coordination intact but slow on the right. Gait was not tested.   ASSESSMENT/PLAN [name] is a 50 y.o. female with history of hepatitis C,sciatica, CHF, and drug seeking behavior presenting to [facility] with complaints of diffuse pain and HA. CT showed a moderate L parieto-occipital hemorrhage with IVH. [facility]  ICH:  moderate left parieto-occipital hemorrhage with resultant IVH, SAH, likely hypertensive etiology  Resultant: Perseveration and asterixis   Neurosurgery consult - no surgery or IVC, manage BP - signed off 04/22/2016  CT at [place] moderate L posterior parietal cortex hemorrhage with IVH, small SAH L temporal and posterior parietal. 4mm L shift  Repeat CT head no significant change, 2mm L shift with some effacement of L basilar cistern  MRI without contrast - acute left temporal occipital intraparenchymal hematoma, L to R shift (3mm), IVH, small volume SAH, SDH  MRA - negative  Repeat CT head in a.m. to rule out hydrocephalus as patient still has headache  LDL 60  HgbA1c 8.1  2D Echo - EF 65-70%. No cardiac source of emboli identified.  SCDs changed to lovenox 40 mg daily for VTE prophylaxis  Diet regular Room service appropriate? Yes; Fluid consistency: Thin   BC Powders prior to admission  Ongoing aggressive stroke risk factor management  Therapy recommendations:   SNF  Disposition:  pending   Headache  Repeat CT head in a.m. to rule out hydrocephalus  On Fioricet when necessary  Hypertensive Emergency  BP 203/116 on arrival to [place] in setting of neurologic symptoms  off cardene drip.   Now on : norvasc 10 qd, losartan 50 bid   Long-term BP goal normotensive  Diabetes  HgbA1c 8.1, goal < 7.0  Uncontrolled  CBG monitoring  On SSI  On glucotrol  Tobacco abuse  Current smoker  Smoking cessation counseling provided  Pt is willing to quit  Other Stroke Risk Factors  Obesity, Body mass index is 36.28 kg/m.  recommend weight loss, diet and exercise as appropriate   Congestive Heart Failure  Hypokalemia 3.4, started yesterday with 20mEq bid  Hospital day # 5   Marvel PlanJindong Seline Enzor, MD PhD Stroke Neurology 04/23/2016 8:01 PM     To contact Stroke Continuity provider, please refer to [email or web address]. After hours, contact General Neurology

## 2016-04-23 NOTE — Clinical Social Work Note (Signed)
CSW Aycock completed assessment with patient and discussed SNF placement and patient declined and this was reported to this CSW.  CSW followed up with patient regarding SNF placement and she continues to decline indicating that SNF placement not needed. Mrs. Stephanie [name] reported that once discharged, her husband plans to take some time off work and be with her at home. Mrs. Stephanie [name] also reported that she has some friends who will help her if needed.  CSW signing off as patient's discharge plan is home. Please reconsult if any other SW services needed by patient prior to discharge.  Stephanie [name], MSW, LCSW Licensed Clinical Social Worker Clinical Social Work Department [facility] [phone]

## 2016-04-23 NOTE — Care Management Note (Signed)
Case Management Note  Patient Details  Name: Stephanie GuerinDianna B [name] MRN: [id number] Date of Birth: November 24, 1965  Subjective/Objective:                    Action/Plan: Per CSW note pt is refusing SNF. CM following for discharge disposition.   Expected Discharge Date:                  Expected Discharge Plan:     In-House Referral:     Discharge planning Services     Post Acute Care Choice:    Choice offered to:     DME Arranged:    DME Agency:     HH Arranged:    HH Agency:     Status of Service:     If discussed at [employer] Meetings, dates discussed:    Additional Comments:  Kermit BaloKelli F Gurtej Noyola, RN 04/23/2016, 3:23 PM

## 2016-04-23 NOTE — Clinical Social Work Note (Signed)
Clinical Social Work Assessment  Patient Details  Name: Stephanie [name] MRN: [id number] Date of Birth: 1965-10-22  Date of referral:  04/23/16               Reason for consult:  Discharge Planning                Permission sought to share information with:  Facility Sport and exercise psychologist, Family Supports Permission granted to share information::  Yes, Verbal Permission Granted  Name::     Stephanie [name],Stephanie [name]  Agency::     Relationship::  Husband  Contact Information:     Housing/Transportation Living arrangements for the past 2 months:  [place] of Information:  Patient Patient Interpreter Needed:  None Criminal Activity/Legal Involvement Pertinent to Current Situation/Hospitalization:  No - Comment as needed Significant Relationships:  Spouse Lives with:  Spouse Do you feel safe going back to the place where you live?  Yes Need for family participation in patient care:  No (Coment)  Care giving concerns:  Patient is refusing SNF placement. She reported she does not believe it is needed.    Social Worker assessment / plan:  CSW met with patient at beside to complete assessment. Patient was resting comfortably in bed. CSW explained PT recommendation for SNF placement. CSW explained SNF search and placement process to the patient and answered her questions. Patient is refusing SNF placement. She reported her father and best friend can stay with her during the day and her husband would be with her at night. Patient reported her support as her husband Stephanie [name]. Per patient request CSW called Stephanie [name], but CSW received a message stating phone was unable to receive calls at this time.   Employment status:  Disabled (Comment on whether or not currently receiving Disability) (Unknown if patient is receiving disability. ) Insurance information:  Managed Care PT Recommendations:  [place] / Referral to community resources:  [place]  Patient/Family's Response to care:  The patient appears happy with the care she is receiving in hospital and is appreciative of CSW assistance.  Patient/Family's Understanding of and Emotional Response to Diagnosis, Current Treatment, and Prognosis:  The patient has a good understanding of why she was admitted. She reported she was slurring her words and had a headache. She understands the care plan and what she will need post discharge.  Emotional Assessment Appearance:  Appears stated age Attitude/Demeanor/Rapport:   (Patient was appropriate.) Affect (typically observed):  Accepting, Calm, Appropriate Orientation: Oriented to Self, Oriented to Place, Oriented to Situation Alcohol / Substance use:  Not Applicable Psych involvement (Current and /or in the community):  No (Comment)  Discharge Needs  Concerns to be addressed:  Discharge Planning Concerns Readmission within the last 30 days:  No Current discharge risk:  None Barriers to Discharge:  No Barriers Identified   Samule Dry, LCSW 04/23/2016, 2:17 PM

## 2016-04-23 NOTE — Progress Notes (Signed)
Inpatient Diabetes Program Recommendations  AACE/ADA: New Consensus Statement on Inpatient Glycemic Control (2015)  Target Ranges:  Prepandial:   less than 140 mg/dL      Peak postprandial:   less than 180 mg/dL (1-2 hours)      Critically ill patients:  140 - 180 mg/dL   Results for Stephanie [name], Stephanie [name] (MRN [id number]) as of 04/23/2016 14:42  Ref. Range 04/22/2016 03:57  Hemoglobin A1C Latest Ref Range: 4.8 - 5.6 % 8.1 (H)  Results for Stephanie [name], Stephanie [name] (MRN [id number]) as of 04/23/2016 14:42  Ref. Range 04/22/2016 21:39 04/23/2016 06:02 04/23/2016 11:43  Glucose-Capillary Latest Ref Range: 65 - 99 mg/dL 562207 (H) 130254 (H) 80   Review of Glycemic Control  Inpatient Diabetes Program Recommendations: Please consider decreasing Novolog correction to sensitive 0-9 units tid.  Thank you, Billy FischerJudy E. Shreeya Recendiz, RN, MSN, CDE Inpatient Glycemic Control Team Team Pager [phone] (8am-5pm) 04/23/2016 2:43 PM

## 2016-04-24 ENCOUNTER — Inpatient Hospital Stay (HOSPITAL_COMMUNITY): Payer: 59

## 2016-04-24 LAB — GLUCOSE, CAPILLARY
GLUCOSE-CAPILLARY: 179 mg/dL — AB (ref 65–99)
GLUCOSE-CAPILLARY: 184 mg/dL — AB (ref 65–99)
Glucose-Capillary: 222 mg/dL — ABNORMAL HIGH (ref 65–99)
Glucose-Capillary: 179 mg/dL — ABNORMAL HIGH (ref 65–99)

## 2016-04-24 LAB — BASIC METABOLIC PANEL
ANION GAP: 13 (ref 5–15)
BUN: 10 mg/dL (ref 6–20)
CHLORIDE: 97 mmol/L — AB (ref 101–111)
CO2: 27 mmol/L (ref 22–32)
Calcium: 9.8 mg/dL (ref 8.9–10.3)
Creatinine, Ser: 0.59 mg/dL (ref 0.44–1.00)
GFR calc Af Amer: >60 mL/min (ref >60)
GFR calc non Af Amer: >60 mL/min (ref >60)
Glucose, Bld: 199 mg/dL — ABNORMAL HIGH (ref 65–99)
POTASSIUM: 3.6 mmol/L (ref 3.5–5.1)
SODIUM: 137 mmol/L (ref 135–145)

## 2016-04-24 LAB — CBC
HEMATOCRIT: 47.8 % — AB (ref 36.0–46.0)
HEMOGLOBIN: 16.3 g/dL — AB (ref 12.0–15.0)
MCH: 31.8 pg (ref 26.0–34.0)
MCHC: 34.1 g/dL (ref 30.0–36.0)
MCV: 93.2 fL (ref 78.0–100.0)
Platelets: 268 10*3/uL (ref 150–400)
RBC: 5.13 MIL/uL — AB (ref 3.87–5.11)
RDW: 12.7 % (ref 11.5–15.5)
WBC: 13.9 10*3/uL — AB (ref 4.0–10.5)

## 2016-04-24 MED ORDER — GLIPIZIDE 5 MG PO TABS
5.0000 mg | ORAL_TABLET | Freq: Two times a day (BID) | ORAL | Status: DC
  Administered 2016-04-24 – 2016-04-30 (×12): 5 mg via ORAL
  Filled 2016-04-24 (×15): qty 1

## 2016-04-24 MED ORDER — LABETALOL HCL 100 MG PO TABS
100.0000 mg | ORAL_TABLET | Freq: Three times a day (TID) | ORAL | Status: DC
  Administered 2016-04-24 – 2016-04-29 (×16): 100 mg via ORAL
  Filled 2016-04-24 (×20): qty 1

## 2016-04-24 MED ORDER — SODIUM CHLORIDE 0.9 % IV SOLN
500.0000 mg | Freq: Two times a day (BID) | INTRAVENOUS | Status: DC
  Administered 2016-04-24 – 2016-04-25 (×2): 500 mg via INTRAVENOUS
  Filled 2016-04-24 (×4): qty 5

## 2016-04-24 NOTE — Progress Notes (Signed)
EEG Completed; Results Pending  

## 2016-04-24 NOTE — Progress Notes (Signed)
EEG shows potential area of epileptogenicity. Will start Keppra 500 mg twice a day.  Ritta SlotMcNeill Karsen Nakanishi, MD [facility] [phone]  If 7pm- 7am, please page neurology on call as listed in AMION.

## 2016-04-24 NOTE — Progress Notes (Signed)
STROKE TEAM PROGRESS NOTE   SUBJECTIVE (INTERVAL HISTORY) Patient lying in bed. Remains confused. Patient refiused SNF placement, so SW signed off. Repeat CT no change of hematoma and less IVH. However, concerning for left transverse sinus thrombosis. Not able to do CTV due to allergy to IV contrast. Will do MRV.    OBJECTIVE Temp:  [97.5 F (36.4 C)-98.2 F (36.8 C)] 97.5 F (36.4 C) (08/16 0955) Pulse Rate:  [95-116] 116 (08/16 0955) Cardiac Rhythm: Sinus tachycardia (08/15 1901) Resp:  [20-22] 22 (08/16 0955) BP: (113-183)/(93-108) 153/99 (08/16 0955) SpO2:  [95 %-100 %] 99 % (08/16 0955)  CBC:   Recent Labs Lab 04/18/16 1226  04/23/16 0734 04/24/16 0623  WBC 8.8  < > 8.5 13.9*  NEUTROABS 5.4  --   --   --   HGB 15.2  < > 15.4* 16.3*  HCT 44.1  < > 46.1* 47.8*  MCV 92.7  < > 94.1 93.2  PLT 192  < > 223 268  < > = values in this interval not displayed.  Basic Metabolic Panel:   Recent Labs Lab 04/23/16 0734 04/24/16 0623  NA 136 137  K 3.6 3.6  CL 95* 97*  CO2 28 27  GLUCOSE 210* 199*  BUN 14 10  CREATININE 0.57 0.59  CALCIUM 9.7 9.8    Lipid Panel:     Component Value Date/Time   CHOL 118 04/22/2016 0357   TRIG 91 04/22/2016 0357   HDL 40 (L) 04/22/2016 0357   CHOLHDL 3.0 04/22/2016 0357   VLDL 18 04/22/2016 0357   LDLCALC 60 04/22/2016 0357    IMAGING I have personally reviewed the radiological images below and agree with the radiology interpretations.  Ct Head Wo Contrast 04/23/2016 Left posterior temporal hemorrhage unchanged. There is possible thrombosis in the left transverse sinus as the cause of the hemorrhage. CT venogram recommended for confirmation.  04/18/2016 No significant change since the prior study with 2.8 x 4.1 cm left parietal intraparenchymal hemorrhage with intraventricular extension and 2 mm left to right midline shift with some effacement of the left basilar cisterns.   04/18/2016 Large intraparenchymal hemorrhage is noted  in left posterior parietal cortex with extension into the left lateral and third ventricles. Small amount of left-sided subarachnoid hemorrhage is noted. 4 mm of left-to-right midline shift is noted.   MRI HEAD  04/20/2016 1. Similar size and appearance of acute right temporal occipital intraparenchymal hematoma measuring 4.0 x 2.8 x 2.7 cm (estimated volume 15 cc). Similar localized edema with trace 3 mm left-to-right shift.  2. Intraventricular extension with blood in the lateral and third ventricles. No evidence for hydrocephalus or ventricular trapping.  3. Small volume acute subarachnoid hemorrhage within the posterior cerebral hemispheres bilaterally, stable from prior CT.  4. Trace left subdural hemorrhage measuring up to 2 mm, difficult to visualize on prior study, but felt to be unchanged.   MRA HEAD  04/20/2016 1. Negative intracranial MRA. No vascular abnormality identified underlying the acute hemorrhage.  2. No large or proximal arterial branch occlusion. No high-grade or correctable stenosis.  3. Multi focal atheromatous irregularity involving the anterior posterior circulations as above, predominantly involving the distal small vessels.   2D Echocardiogram  - Procedure narrative: Transthoracic echocardiography. Image quality was poor. The study was technically difficult, as a result of poor sound wave transmission.  - Left ventricle: The cavity size was normal. Wall thickness was increased in a pattern of mild LVH. There was mild focal basal hypertrophy of  the septum. Systolic function was vigorous. The estimated ejection fraction was in the range of 65% to 70%. Wall motion was normal; there were no regional wall motion abnormalities. Doppler parameters are consistent with abnormal left ventricular relaxation (grade 1 diastolic dysfunction). Impressions:   Vigorous LV systolic function; grade 1 diastolic dysfunction.   PHYSICAL EXAM Obese middle aged caucasian lady not in distress.  Afebrile. Head is nontraumatic. Neck is supple without bruit. Cardiac exam no murmur or gallop. Lungs are clear to auscultation. Distal pulses are well felt. Neurological Exam :  Sleepy drowsy today after Xanax and dilaudid. Orientated to self but not orientated to age, place and time. Unable to name the current president or name the hospital. Fluent when speaks, but comprehension is impaired, including with complex multistep commands. Naming is impaired with perseveration. Repetition is normal.  Pupils equal reactive. Fundi were not visualized. Visual field appears to be intact although slow reaction. Facial symmetrical. Tongue midline. Motor symmetrical bilaterally 4/5, but with significant bilateral asterixis. Sensation symmetrical. DTR 1+, no babinski. Coordination intact but slow on the right. Gait was not tested.   ASSESSMENT/PLAN Ms. Elayne GuerinDianna B Vicars is a 50 y.o. female with history of hepatitis C,sciatica, CHF, and drug seeking behavior presenting to [place] with complaints of diffuse pain and HA. CT showed a moderate L parieto-occipital hemorrhage with IVH. [facility]  ICH:  moderate left parieto-occipital hemorrhage with resultant IVH, SAH, likely hypertensive etiology. However, need to rule out CVT. MRV pending  Resultant - confused with HA  Neurosurgery consult - no surgery or IVC, manage BP - signed off 04/22/2016  CT at [place] moderate L posterior parietal cortex hemorrhage with IVH, small SAH L temporal and posterior parietal. 4mm L shift  Repeat CT head no significant change, 2mm L shift with some effacement of L basilar cistern  MRI without contrast - acute left temporal occipital intraparenchymal hematoma, L to R shift (3mm), IVH, small volume SAH, SDH  MRA - negative  Repeat CT head this a.m. Unchanged, no hydrocephalus but concerning for left transverse sinus thrombosis  MRV pending   EEG pending   LDL 60  HgbA1c 8.1  2D Echo - EF  65-70%. No cardiac source of emboli identified.  SCDs changed to lovenox 40 mg daily for VTE prophylaxis  Diet regular Room service appropriate? Yes; Fluid consistency: Thin   BC Powders prior to admission  Ongoing aggressive stroke risk factor management  Therapy recommendations:  SNF. Patient refused SNF.  Disposition:  pending   Headache  Repeat CT head this a.m. Unchanged, no hydrocephalus  On Fioricet when necessary.   Also received xanax and diladid this am  MRV pending to rule out CVT  Hypertensive Emergency  BP 203/116 on arrival to  in setting of neurologic symptoms  off cardene drip.   Now on : norvasc 10 qd, losartan 50 bid   Add labetalol 100mg  tid  Long-term BP goal normotensive  Diabetes  HgbA1c 8.1, goal < 7.0  Uncontrolled  CBG monitoring  On SSI  On glucotrol, increase to bid dosing  Tobacco abuse  Current smoker  Smoking cessation counseling provided  Pt is willing to quit  Other Stroke Risk Factors  Obesity, Body mass index is 36.28 kg/m.  recommend weight loss, diet and exercise as appropriate   Congestive Heart Failure  Hypokalemia 3.4, started yesterday with 20mEq bid  Hospital day # 6  Marvel PlanJindong Lovada Barwick, MD PhD Stroke Neurology 04/24/2016 4:14 PM  To contact Stroke Continuity provider, please refer to [email or web address] After hours, contact General Neurology

## 2016-04-24 NOTE — Procedures (Signed)
History: 50 yo F with altered mental status after left temporal hemorrhage.   Sedation: None  Technique: This is a 21 channel routine scalp EEG performed at the bedside with bipolar and monopolar montages arranged in accordance to the international 10/20 system of electrode placement. One channel was dedicated to EKG recording.    Background: There is a posterior dominant rhythm of 8 Hz that attenuates with eye opening. In addition, even during maximal wakefulness there is generalized irregular delta and theta activities. At times this does appear to have a slight prominence in the left temporal region. There are occasional left temporal sharp wave discharges maximal at F7, T7.   Photic stimulation: Physiologic driving is not performed  EEG Abnormalities: 1) left temporal sharp wave discharges 2) generalized irregular slow activity  Clinical Interpretation: This EEG indicates a potential seizure focus in the left temporal region. No seizure was recorded on this study.  Ritta SlotMcNeill Charlies Rayburn, MD [facility] [phone]  If 7pm- 7am, please page neurology on call as listed in AMION.

## 2016-04-25 DIAGNOSIS — R569 Unspecified convulsions: Secondary | ICD-10-CM

## 2016-04-25 DIAGNOSIS — E785 Hyperlipidemia, unspecified: Secondary | ICD-10-CM

## 2016-04-25 DIAGNOSIS — Z720 Tobacco use: Secondary | ICD-10-CM

## 2016-04-25 DIAGNOSIS — I676 Nonpyogenic thrombosis of intracranial venous system: Secondary | ICD-10-CM

## 2016-04-25 LAB — GLUCOSE, CAPILLARY
GLUCOSE-CAPILLARY: 96 mg/dL (ref 65–99)
GLUCOSE-CAPILLARY: 140 mg/dL — AB (ref 65–99)
Glucose-Capillary: 173 mg/dL — ABNORMAL HIGH (ref 65–99)
Glucose-Capillary: 282 mg/dL — ABNORMAL HIGH (ref 65–99)

## 2016-04-25 LAB — HEPARIN LEVEL (UNFRACTIONATED): Heparin Unfractionated: 0.15 IU/mL — ABNORMAL LOW (ref 0.30–0.70)

## 2016-04-25 MED ORDER — ALPRAZOLAM 0.5 MG PO TABS
1.0000 mg | ORAL_TABLET | Freq: Every evening | ORAL | Status: DC | PRN
  Administered 2016-04-25 – 2016-04-27 (×2): 1 mg via ORAL
  Filled 2016-04-25 (×3): qty 2

## 2016-04-25 MED ORDER — LEVETIRACETAM 500 MG PO TABS: 500.0000 mg | ORAL_TABLET | Freq: Two times a day (BID) | ORAL | Status: DC

## 2016-04-25 MED ORDER — HEPARIN (PORCINE) IN NACL 100-0.45 UNIT/ML-% IJ SOLN
1150.0000 [IU]/h | INTRAMUSCULAR | Status: DC
  Administered 2016-04-25: 950 [IU]/h via INTRAVENOUS
  Administered 2016-04-26 – 2016-04-27 (×2): 1150 [IU]/h via INTRAVENOUS
  Filled 2016-04-25 (×11): qty 250

## 2016-04-25 MED ORDER — LEVETIRACETAM 500 MG PO TABS
500.0000 mg | ORAL_TABLET | Freq: Two times a day (BID) | ORAL | Status: DC
  Administered 2016-04-25 – 2016-04-30 (×10): 500 mg via ORAL
  Filled 2016-04-25 (×10): qty 1

## 2016-04-25 NOTE — Progress Notes (Signed)
Pt transferring to [facility] for IV heparin gtt with close monitoring. Pt refusing SNF at discharge. She states she wants to go home. She states that her husband will be with her at night and she will have a friend with her during the day. CM spoke to Pt's husband and he verified this plan. He states he would drop her off on his way to work with some friends that could provide care while he works and he would pick her up on the way home. CM will continue to follow for further d/c needs.

## 2016-04-25 NOTE — Progress Notes (Addendum)
ANTICOAGULATION CONSULT NOTE - Initial Consult  Pharmacy Consult for Heparin Indication: venous sinus thrombosis in setting of ICH  Allergies  Allergen Reactions  . Aspirin Itching  . Buprenorphine Hcl Itching  . Codeine Other (See Comments)    Makes her feel like something is crawling under her skin. Can take, sometimes makes her itch  . Compazine Itching    "makes my skin crawl"  . Ioxaglate Itching  . Ivp Dye [Iodinated Diagnostic Agents] Itching    Makes her itch really bad. Had to get 2-3 shots of Benadryl when she was in the hospital.  . Metrizamide Itching  . Morphine And Related Itching  . Naproxen Itching  . Norco [Hydrocodone-Acetaminophen] Itching  . Penicillins Itching and Other (See Comments)    Makes her feel really uncomfortable. No other information available.  . Prochlorperazine Maleate Other (See Comments)    Compazine makes her skin crawl - needs 2-3 shots of Benadryl to get relief.  . Reglan [Metoclopramide] Itching and Other (See Comments)    Makes her skin crawl  . Tegretol [Carbamazepine] Itching and Other (See Comments)    Makes her feel like something is crawling under her skin.  . Toradol [Ketorolac Tromethamine] Itching  . Tramadol Itching    Patient Measurements: Height: 5\' 3"  (160 cm) Weight: 204 lb 12.8 oz (92.9 kg) IBW/kg (Calculated) : 52.4 Heparin Dosing Weight: 74 kg  Vital Signs: Temp: 98.5 F (36.9 C) (08/17 0959) Temp Source: Oral (08/17 0959) BP: 140/79 (08/17 0959) Pulse Rate: 95 (08/17 0959)  Labs:  Recent Labs  04/23/16 0734 04/24/16 0623  HGB 15.4* 16.3*  HCT 46.1* 47.8*  PLT 223 268  CREATININE 0.57 0.59    Estimated Creatinine Clearance: 90.1 mL/min (by C-G formula based on SCr of 0.8 mg/dL).   Medical History: Past Medical History:  Diagnosis Date  . Diverticulitis   . Hepatitis C   . Hypertension   . Kidney stones   . Sciatica     Medications:  Prescriptions Prior to Admission  Medication Sig  Dispense Refill Last Dose  . alprazolam (XANAX) 2 MG tablet Take 2 mg by mouth daily as needed for anxiety.    maybe 8/10  . aspirin-acetaminophen-caffeine (EXCEDRIN MIGRAINE) 250-250-65 MG tablet Take 2 tablets by mouth every 6 (six) hours as needed for headache.   maybe 8/10  . busPIRone (BUSPAR) 10 MG tablet Take 10 mg by mouth 3 (three) times daily.   maybe 8/10  . dicyclomine (BENTYL) 20 MG tablet Take 20 mg by mouth 2 (two) times daily.    maybe 8/10  . DULoxetine (CYMBALTA) 60 MG capsule Take 60 mg by mouth daily.   maybe 8/10  . glipiZIDE (GLUCOTROL) 5 MG tablet Take 5 mg by mouth daily before breakfast.   maybe 8/10  . losartan (COZAAR) 50 MG tablet Take 50 mg by mouth 2 (two) times daily.   maybe 8/10  . metFORMIN (GLUCOPHAGE) 500 MG tablet Take 1,000 mg by mouth 2 (two) times daily with a meal.   maybe 8/10  . omeprazole (PRILOSEC) 20 MG capsule Take 40 mg by mouth daily.   maybe 8/10  . oxyCODONE (OXY IR/ROXICODONE) 5 MG immediate release tablet Take 5-10 mg by mouth every 4 (four) hours as needed (pain).   maybe 8/10  . promethazine (PHENERGAN) 25 MG tablet Take 25 mg by mouth every 6 (six) hours as needed for nausea or vomiting.    maybe 8/10  . zolpidem (AMBIEN) 10 MG tablet Take  10 mg by mouth at bedtime.    maybe 8/9   Scheduled:  . amLODipine  10 mg Oral Daily  . busPIRone  10 mg Oral TID  . dicyclomine  20 mg Oral BID  . DULoxetine  60 mg Oral Daily  . enoxaparin (LOVENOX) injection  40 mg Subcutaneous Q24H  . glipiZIDE  5 mg Oral BID AC  . insulin aspart  0-15 Units Subcutaneous TID WC  . insulin aspart  0-5 Units Subcutaneous QHS  . labetalol  100 mg Oral TID  . levETIRAcetam  500 mg Intravenous Q12H  . losartan  50 mg Oral BID  . pantoprazole  80 mg Oral Daily   Infusions:    Assessment: 50yo female with AMS following L temporal hemorrhage. Pharmacy is consulted to dose heparin for venous sinus thrombosis in setting of ICH. 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 Will dose conservatively in setting of ICH with no bolus and more restricted goal.   Pt was on prophylactic lovenox with last dose given 8/16 at 1137.  Goal of Therapy:  Heparin level 0.3-0.5 units/ml Monitor platelets by anticoagulation protocol: Yes   Plan:  Start heparin infusion at 950 units/hr Check anti-Xa level in 8 hours and daily while on heparin Continue to monitor H&H and platelets  Arlean Hoppingorey M. Newman PiesBall, PharmD, BCPS Clinical Pharmacist Pager [phone] 04/25/2016,10:51 AM

## 2016-04-25 NOTE — Progress Notes (Signed)
STROKE TEAM PROGRESS NOTE   SUBJECTIVE (INTERVAL HISTORY) Patient lying in bed. Confusion resolved this am - has not had dilaudid this am. Patient states her head hurt yesterday, but is better today.  Dr. Roda ShuttersXu discussed MRI findings with patient and risk and benefit of treatment with anticoagulants. Pt upset. She really wants to go home. Dr. Roda ShuttersXu talked at length with her. She states she will work with him, though she does want to go home.    OBJECTIVE Temp:  [97.5 F (36.4 C)-98.5 F (36.9 C)] 98.5 F (36.9 C) (08/17 0959) Pulse Rate:  [87-117] 95 (08/17 0959) Cardiac Rhythm: Normal sinus rhythm (08/17 0921) Resp:  [18-22] 20 (08/17 0959) BP: (128-149)/(79-93) 140/79 (08/17 0959) SpO2:  [94 %-100 %] 98 % (08/17 0959)  CBC:   Recent Labs Lab 04/18/16 1226  04/23/16 0734 04/24/16 0623  WBC 8.8  < > 8.5 13.9*  NEUTROABS 5.4  --   --   --   HGB 15.2  < > 15.4* 16.3*  HCT 44.1  < > 46.1* 47.8*  MCV 92.7  < > 94.1 93.2  PLT 192  < > 223 268  < > = values in this interval not displayed.  Basic Metabolic Panel:   Recent Labs Lab 04/23/16 0734 04/24/16 0623  NA 136 137  K 3.6 3.6  CL 95* 97*  CO2 28 27  GLUCOSE 210* 199*  BUN 14 10  CREATININE 0.57 0.59  CALCIUM 9.7 9.8    Lipid Panel:     Component Value Date/Time   CHOL 118 04/22/2016 0357   TRIG 91 04/22/2016 0357   HDL 40 (L) 04/22/2016 0357   CHOLHDL 3.0 04/22/2016 0357   VLDL 18 04/22/2016 0357   LDLCALC 60 04/22/2016 0357    IMAGING I have personally reviewed the radiological images below and agree with the radiology interpretations.  Ct Head Wo Contrast 04/24/2016 Left posterior temporal hemorrhage unchanged. There is possible thrombosis in the left transverse sinus as the cause of the hemorrhage. CT venogram recommended for confirmation.  04/18/2016 No significant change since the prior study with 2.8 x 4.1 cm left parietal intraparenchymal hemorrhage with intraventricular extension and 2 mm left to  right midline shift with some effacement of the left basilar cisterns.   04/18/2016 Large intraparenchymal hemorrhage is noted in left posterior parietal cortex with extension into the left lateral and third ventricles. Small amount of left-sided subarachnoid hemorrhage is noted. 4 mm of left-to-right midline shift is noted.   MRI HEAD  04/20/2016 1. Similar size and appearance of acute right temporal occipital intraparenchymal hematoma measuring 4.0 x 2.8 x 2.7 cm (estimated volume 15 cc). Similar localized edema with trace 3 mm left-to-right shift.  2. Intraventricular extension with blood in the lateral and third ventricles. No evidence for hydrocephalus or ventricular trapping.  3. Small volume acute subarachnoid hemorrhage within the posterior cerebral hemispheres bilaterally, stable from prior CT.  4. Trace left subdural hemorrhage measuring up to 2 mm, difficult to visualize on prior study, but felt to be unchanged.   MRA HEAD  04/20/2016 1. Negative intracranial MRA. No vascular abnormality identified underlying the acute hemorrhage.  2. No large or proximal arterial branch occlusion. No high-grade or correctable stenosis.  3. Multi focal atheromatous irregularity involving the anterior posterior circulations as above, predominantly involving the distal small vessels.   MRV Head 04/24/2016 Filling defect at the junction of the LEFT transverse and sigmoid sinus consistent with nonocclusive LEFT dural venous sinus thrombosis.  2D Echocardiogram  - Procedure narrative: Transthoracic echocardiography. Image quality was poor. The study was technically difficult, as a result of poor sound wave transmission.  - Left ventricle: The cavity size was normal. Wall thickness was increased in a pattern of mild LVH. There was mild focal basal hypertrophy of the septum. Systolic function was vigorous. The estimated ejection fraction was in the range of 65% to 70%. Wall motion was normal; there were no  regional wall motion abnormalities. Doppler parameters are consistent with abnormal left ventricular relaxation (grade 1 diastolic dysfunction). Impressions:   Vigorous LV systolic function; grade 1 diastolic dysfunction.  EEG  1) left temporal sharp wave discharges 2) generalized irregular slow activity Clinical Interpretation: This EEG indicates a potential seizure focus in the left temporal region. No seizure was recorded on this study.   PHYSICAL EXAM Obese middle aged caucasian lady not in distress. Afebrile. Head is nontraumatic. Neck is supple without bruit. Cardiac exam no murmur or gallop. Lungs are clear to auscultation. Distal pulses are well felt. Neurological Exam :  Sleepy drowsy today after Xanax and dilaudid. Orientated to self but not orientated to age, place and time. Unable to name the current president or name the hospital. Fluent when speaks, but comprehension is impaired, including with complex multistep commands. Naming is impaired with perseveration. Repetition is normal.  Pupils equal reactive. Fundi were not visualized. Visual field appears to be intact although slow reaction. Facial symmetrical. Tongue midline. Motor symmetrical bilaterally 4/5, but with significant bilateral asterixis. Sensation symmetrical. DTR 1+, no babinski. Coordination intact but slow on the right. Gait was not tested.   ASSESSMENT/PLAN Stephanie [name] is a 50 y.o. female with history of hepatitis C,sciatica, CHF, and drug seeking behavior presenting to [facility] with complaints of diffuse pain and HA. CT showed a moderate L parieto-occipital hemorrhage with IVH. She was transferred to [facility] for admission  ICH:  moderate left parieto-occipital hemorrhage with resultant IVH, SAH, secondary to L transverse sinus thrombosis  Resultant - confused with HA  Neurosurgery consult - no surgery or IVC, manage BP - signed off 04/22/2016  CT at [place] moderate L posterior parietal cortex  hemorrhage with IVH, small SAH L temporal and posterior parietal. 4mm L shift  Repeat CT head no significant change, 2mm L shift with some effacement of L basilar cistern  MRI without contrast - acute left temporal occipital intraparenchymal hematoma, L to R shift (3mm), IVH, small volume SAH, SDH  MRA - negative  Repeat CT head this a.m. Unchanged, no hydrocephalus but concerning for left transverse sinus thrombosis  MRV nonocclusive L transverse sinus thrombosis  LDL 60  HgbA1c 8.1  2D Echo - EF 65-70%. No cardiac source of emboli identified.  heparin IV for VTE prophylaxis  Diet regular Room service appropriate? Yes; Fluid consistency: Thin   BC Powders prior to admission  Start IV heparin per pharmacy, no bolus, level 0.3-0.5  Transfer pt to stepdown ICU for increased frequency VS and neuro checks given heparin in setting of ICH  Ongoing aggressive stroke risk factor management  Therapy recommendations:  SNF. Patient refused SNF.  Disposition:  pending   Headache  Repeat CT head Unchanged, no hydrocephalus  On Fioricet when necessary.   Improved today per pt  MRV nonocclusive L dural venous sinus thrombosis  On heparin IV  Stat CT if any neuro changes.   Abnormal EEG  EEG positive for potential area of epileptogenicity  Started on Keppra bid  Repeat EEG in am  Hypertensive Emergency  BP 203/116 on arrival to [place] in setting of neurologic symptoms  off cardene drip.   on norvasc 10 qd, losartan 50 bid and labetalol 100mg  tid  Long-term BP goal normotensive  Diabetes  HgbA1c 8.1, goal < 7.0  Uncontrolled  CBG monitoring  On SSI  On glucotrol, increased to bid dosing  Tobacco abuse  Current smoker  Smoking cessation counseling provided  Pt is willing to quit  Other Stroke Risk Factors  Obesity, Body mass index is 36.28 kg/m.  recommend weight loss, diet and exercise as appropriate   Congestive Heart  Failure  Hypokalemia 3.4 -> 3.6  Hospital day # 7  This patient is critically ill due to cerebral venous sinus thrombosis with ICH, IVH and SAH, seizure with abnormal EEG and at significant risk of neurological worsening, death form hemorrhage progression, hematoma enlargement, cerebral edema and brain herniation, status epilepticus. This patient's care requires constant monitoring of vital signs, hemodynamics, respiratory and cardiac monitoring, review of multiple databases, neurological assessment, discussion with family, other specialists and medical decision making of high complexity. I spent 40 minutes of neurocritical care time in the care of this patient.  Lavetta Geier, MD PhD [facility] 04/25/2016 3:55 PM      To contact Stroke Continuity provider, please refer to [email or web address]. After hours, contact General Neurology

## 2016-04-25 NOTE — Progress Notes (Signed)
OT Cancellation Note  Patient Details Name: Elayne GuerinDianna B Willmon MRN: [id number] DOB: 1966-08-24   Cancelled Treatment:    Reason Eval/Treat Not Completed: Other (comment). Pt transferring floors and starting heparin for left dural venous sinus thrombosis. Will continue to follow pt acutely.  Gaye AlkenBailey A Eman Rynders M.S., OTR/L Pager: [phone]  04/25/2016, 2:45 PM

## 2016-04-25 NOTE — Progress Notes (Signed)
ANTICOAGULATION CONSULT NOTE - Initial Consult  Pharmacy Consult for Heparin Indication: venous sinus thrombosis in setting of ICH  Allergies  Allergen Reactions  . Aspirin Itching  . Buprenorphine Hcl Itching  . Codeine Other (See Comments)    Makes her feel like something is crawling under her skin. Can take, sometimes makes her itch  . Compazine Itching    "makes my skin crawl"  . Ioxaglate Itching  . Ivp Dye [Iodinated Diagnostic Agents] Itching    Makes her itch really bad. Had to get 2-3 shots of Benadryl when she was in the hospital.  . Metrizamide Itching  . Morphine And Related Itching  . Naproxen Itching  . Norco [Hydrocodone-Acetaminophen] Itching  . Penicillins Itching and Other (See Comments)    Makes her feel really uncomfortable. No other information available.  . Prochlorperazine Maleate Other (See Comments)    Compazine makes her skin crawl - needs 2-3 shots of Benadryl to get relief.  . Reglan [Metoclopramide] Itching and Other (See Comments)    Makes her skin crawl  . Tegretol [Carbamazepine] Itching and Other (See Comments)    Makes her feel like something is crawling under her skin.  . Toradol [Ketorolac Tromethamine] Itching  . Tramadol Itching    Patient Measurements: Height: 5\' 3"  (160 cm) Weight: 204 lb 12.8 oz (92.9 kg) IBW/kg (Calculated) : 52.4 Heparin Dosing Weight: 74 kg  Vital Signs: Temp: 98 F (36.7 C) (08/17 1944) Temp Source: Oral (08/17 1944) BP: 112/73 (08/17 1900) Pulse Rate: 89 (08/17 1900)  Labs:  Recent Labs  04/23/16 0734 04/24/16 0623 04/25/16 2202  HGB 15.4* 16.3*  --   HCT 46.1* 47.8*  --   PLT 223 268  --   HEPARINUNFRC  --   --  0.15*  CREATININE 0.57 0.59  --     Estimated Creatinine Clearance: 90.1 mL/min (by C-G formula based on SCr of 0.8 mg/dL).   Medical History: Past Medical History:  Diagnosis Date  . Diverticulitis   . Hepatitis C   . Hypertension   . Kidney stones   . Sciatica      Medications:  Prescriptions Prior to Admission  Medication Sig Dispense Refill Last Dose  . alprazolam (XANAX) 2 MG tablet Take 2 mg by mouth daily as needed for anxiety.    maybe 8/10  . aspirin-acetaminophen-caffeine (EXCEDRIN MIGRAINE) 250-250-65 MG tablet Take 2 tablets by mouth every 6 (six) hours as needed for headache.   maybe 8/10  . busPIRone (BUSPAR) 10 MG tablet Take 10 mg by mouth 3 (three) times daily.   maybe 8/10  . dicyclomine (BENTYL) 20 MG tablet Take 20 mg by mouth 2 (two) times daily.    maybe 8/10  . DULoxetine (CYMBALTA) 60 MG capsule Take 60 mg by mouth daily.   maybe 8/10  . glipiZIDE (GLUCOTROL) 5 MG tablet Take 5 mg by mouth daily before breakfast.   maybe 8/10  . losartan (COZAAR) 50 MG tablet Take 50 mg by mouth 2 (two) times daily.   maybe 8/10  . metFORMIN (GLUCOPHAGE) 500 MG tablet Take 1,000 mg by mouth 2 (two) times daily with a meal.   maybe 8/10  . omeprazole (PRILOSEC) 20 MG capsule Take 40 mg by mouth daily.   maybe 8/10  . oxyCODONE (OXY IR/ROXICODONE) 5 MG immediate release tablet Take 5-10 mg by mouth every 4 (four) hours as needed (pain).   maybe 8/10  . promethazine (PHENERGAN) 25 MG tablet Take 25 mg by mouth  every 6 (six) hours as needed for nausea or vomiting.    maybe 8/10  . zolpidem (AMBIEN) 10 MG tablet Take 10 mg by mouth at bedtime.    maybe 8/9   Scheduled:  . amLODipine  10 mg Oral Daily  . busPIRone  10 mg Oral TID  . dicyclomine  20 mg Oral BID  . DULoxetine  60 mg Oral Daily  . glipiZIDE  5 mg Oral BID AC  . insulin aspart  0-15 Units Subcutaneous TID WC  . insulin aspart  0-5 Units Subcutaneous QHS  . labetalol  100 mg Oral TID  . levETIRAcetam  500 mg Oral BID  . losartan  50 mg Oral BID  . pantoprazole  80 mg Oral Daily   Infusions:  . heparin 950 Units/hr (04/25/16 1408)    Assessment: 50yo female with AMS following L temporal hemorrhage. Pharmacy is consulted to dose heparin for venous sinus thrombosis in setting of  ICH. A left dural venous sinus thrombosis was revealed on MRI. Will dose conservatively in setting of ICH with no bolus and more restricted goal.   Pt was on prophylactic lovenox with last dose given 8/16 at 1137.  First HL returned subtherapeutic at 0.15, will increase conservatively 2 units/kg/hour.   Goal of Therapy:  Heparin level 0.3-0.5 units/ml Monitor platelets by anticoagulation protocol: Yes   Plan:  Start heparin infusion at 1150 units/hr Check anti-Xa level with AM labs and daily while on heparin Continue to monitor H&H and platelets  Ruben Imony Olean Sangster, PharmD Clinical Pharmacist Pager: [phone] 04/25/2016 10:27 PM

## 2016-04-26 ENCOUNTER — Inpatient Hospital Stay (HOSPITAL_COMMUNITY): Payer: 59

## 2016-04-26 DIAGNOSIS — R9401 Abnormal electroencephalogram [EEG]: Secondary | ICD-10-CM

## 2016-04-26 LAB — GLUCOSE, CAPILLARY
GLUCOSE-CAPILLARY: 160 mg/dL — AB (ref 65–99)
GLUCOSE-CAPILLARY: 206 mg/dL — AB (ref 65–99)
GLUCOSE-CAPILLARY: 158 mg/dL — AB (ref 65–99)
Glucose-Capillary: 350 mg/dL — ABNORMAL HIGH (ref 65–99)
Glucose-Capillary: 156 mg/dL — ABNORMAL HIGH (ref 65–99)

## 2016-04-26 LAB — CBC
HCT: 45.1 % (ref 36.0–46.0)
Hemoglobin: 14.7 g/dL (ref 12.0–15.0)
MCH: 31.2 pg (ref 26.0–34.0)
MCHC: 32.6 g/dL (ref 30.0–36.0)
MCV: 95.8 fL (ref 78.0–100.0)
PLATELETS: 232 10*3/uL (ref 150–400)
RBC: 4.71 MIL/uL (ref 3.87–5.11)
RDW: 13 % (ref 11.5–15.5)
WBC: 9.8 10*3/uL (ref 4.0–10.5)

## 2016-04-26 LAB — HEPARIN LEVEL (UNFRACTIONATED)
HEPARIN UNFRACTIONATED: 0.34 [IU]/mL (ref 0.30–0.70)
Heparin Unfractionated: 0.41 IU/mL (ref 0.30–0.70)

## 2016-04-26 NOTE — Progress Notes (Signed)
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 Aspirin Itching  . Buprenorphine Hcl Itching  . Codeine Other (See Comments)    Makes her feel like something is crawling under her skin. Can take, sometimes makes her itch  . Compazine Itching    "makes my skin crawl"  . Ioxaglate Itching  . Ivp Dye [Iodinated Diagnostic Agents] Itching    Makes her itch really bad. Had to get 2-3 shots of Benadryl when she was in the hospital.  . Metrizamide Itching  . Morphine And Related Itching  . Naproxen Itching  . Norco [Hydrocodone-Acetaminophen] Itching  . Penicillins Itching and Other (See Comments)    Makes her feel really uncomfortable. No other information available.  . Prochlorperazine Maleate Other (See Comments)    Compazine makes her skin crawl - needs 2-3 shots of Benadryl to get relief.  . Reglan [Metoclopramide] Itching and Other (See Comments)    Makes her skin crawl  . Tegretol [Carbamazepine] Itching and Other (See Comments)    Makes her feel like something is crawling under her skin.  . Toradol [Ketorolac Tromethamine] Itching  . Tramadol Itching    Patient Measurements: Height: 5\' 3"  (160 cm) Weight: 204 lb 12.8 oz (92.9 kg) IBW/kg (Calculated) : 52.4  Vital Signs: Temp: 98.2 F (36.8 C) (08/18 0338) Temp Source: Oral (08/18 0338) BP: 116/91 (08/18 0300) Pulse Rate: 78 (08/18 0300)  Labs:  Recent Labs  04/23/16 0734 04/24/16 0623 04/25/16 2202 04/26/16 0527  HGB 15.4* 16.3*  --  14.7  HCT 46.1* 47.8*  --  45.1  PLT 223 268  --  232  HEPARINUNFRC  --   --  0.15* 0.41  CREATININE 0.57 0.59  --   --     Estimated Creatinine Clearance: 90.1 mL/min (by C-G formula based on SCr of 0.8 mg/dL).   Assessment: Heparin for venous sinus thrombosis in setting of ICH, Hgb remains ok at 14.7, heparin level this therapeutic x 1 after rate  increase  Goal of Therapy:  Heparin level 0.3-0.5 units/ml Monitor platelets by anticoagulation protocol: Yes   Plan:  -Cont heparin at 1150 units/hr -1200 confirmatory HL   Stephanie [name], Stephanie [name] 04/26/2016,6:31 AM

## 2016-04-26 NOTE — Plan of Care (Signed)
Talked with husband over the phone about OT concerns and recommendation for SNF. Husband understood and he will come tomorrow and talk to pt. Current plan is to switch to PO anticoagulation Sunday or Monday if pt neuro stable and if she still needs SNF, will work on SNF placement next week. Husband expressed understanding.   Marvel PlanJindong Jomo Forand, MD PhD Stroke Neurology 04/26/2016 6:16 PM

## 2016-04-26 NOTE — Progress Notes (Signed)
Inpatient Diabetes Program Recommendations  AACE/ADA: New Consensus Statement on Inpatient Glycemic Control (2015)  Target Ranges:  Prepandial:   less than 140 mg/dL      Peak postprandial:   less than 180 mg/dL (1-2 hours)      Critically ill patients:  140 - 180 mg/dL  Results for Stephanie [name], Stephanie [name] (MRN [id number]) as of 04/26/2016 12:11  Ref. Range 04/25/2016 07:18 04/25/2016 11:28 04/25/2016 17:38 04/25/2016 21:21 04/26/2016 08:30 04/26/2016 11:48  Glucose-Capillary Latest Ref Range: 65 - 99 mg/dL 409173 (H) 96 811140 (H) 914282 (H) 160 (H) 350 (H)    Review of Glycemic Control  Diabetes history: DM2 Outpatient Diabetes medications: Glipizide 5 mg QAM, Metformin 1000 mg BID Current orders for Inpatient glycemic control: Novolog 0-15 units TID with meals, Novolog 0-5 units QHS, Glipizide 5 mg BID  Inpatient Diabetes Program Recommendations: Insulin - Meal Coverage: If post prandial glucose continues to be elevated after diet is changed, please consider ordering Novolog 3 units TID with meals for meal coverage. Oral Agents: Noted Glipizide was increased to BID dosing. Diet: Currently ordered Regular diet. Please discontinue Regular diet and order Carb Modified diet is appropriate.  Thanks, Orlando PennerMarie Tahesha Skeet, RN, MSN, CDE Diabetes Coordinator Inpatient Diabetes Program [phone] (Team Pager from 8am to 5pm) [phone] ([facility] office) [phone] [facility] office) [phone] [facility] office)

## 2016-04-26 NOTE — Progress Notes (Signed)
Physical Therapy Treatment Patient Details Name: Stephanie GuerinDianna B Klecker MRN: [id number] DOB: August 20, 1966 Today's Date: 04/26/2016    History of Present Illness Indira B Phillipsis an 50 y.o.femaleadmitted with Left parieto-occipital hematoma. PMHx: hepatitis C,sciatica, recent history of congestive heart failure, and drug seeking behavior. Pt with right carpal tunnel release and De Quervain release on 04/15/16.  Has splint on right UE.      PT Comments    Pt progressing with all mobility but remains limited by cognition, safety awareness and high level balance deficits. Pt states she can arrange 24hr care but does not demonstrate importance of needing it and family not present to confirm. Pt unable to state how to manage in an emergency and is not safe without supervision at this time.   Follow Up Recommendations  Supervision/Assistance - 24 hour;Outpatient PT     Equipment Recommendations  None recommended by PT    Recommendations for Other Services       Precautions / Restrictions Precautions Precautions: Fall Other Brace/Splint: R wrist splint    Mobility  Bed Mobility Overal bed mobility: Modified Independent                Transfers Overall transfer level: Needs assistance     Sit to Stand: Supervision         General transfer comment: supervision for safety and lines  Ambulation/Gait Ambulation/Gait assistance: Supervision Ambulation Distance (Feet): 450 Feet Assistive device: None Gait Pattern/deviations: Step-through pattern   Gait velocity interpretation: at or above normal speed for age/gender General Gait Details: pt with generally steady gait but required cues to avoid object to left x 1, supervision for lines, direction and safety   Stairs Stairs: Yes Stairs assistance: Supervision Stair Management: Alternating pattern;Forwards;One rail Left Number of Stairs: 4 General stair comments: Pt with ability to ascend and descend stairs but not  following commands for safety with IV pole and required constant verbal and tactile cues  Wheelchair Mobility    Modified Rankin (Stroke Patients Only) Modified Rankin (Stroke Patients Only) Pre-Morbid Rankin Score: No significant disability Modified Rankin: Moderately severe disability     Balance     Sitting balance-Leahy Scale: Good       Standing balance-Leahy Scale: Good               High level balance activites: Backward walking;Direction changes;Sudden stops;Head turns;Side stepping High Level Balance Comments: minguard for side stepping, 360 degree turns and backward walking. no deficits with head turns & speed changes    Cognition Arousal/Alertness: Awake/alert Behavior During Therapy: Impulsive Overall Cognitive Status: Impaired/Different from baseline Area of Impairment: Attention;Memory;Following commands;Safety/judgement Orientation Level: Place Current Attention Level: Sustained Memory: Decreased short-term memory Following Commands: Follows one step commands inconsistently Safety/Judgement: Decreased awareness of deficits;Decreased awareness of safety   Problem Solving: Difficulty sequencing;Requires verbal cues General Comments: Pt with difficulty following commands for balance activities, pulling at lines despite cues and direction, unable to state how to call 911 in an emergency, lack of insight into safety and cognitive deficits    Exercises      General Comments        Pertinent Vitals/Pain Pain Score: 6  Pain Location: HA Pain Descriptors / Indicators: Aching    Home Living                      Prior Function            PT Goals (current goals can now be found in the  care plan section) Progress towards PT goals: Progressing toward goals    Frequency       PT Plan Discharge plan needs to be updated    Co-evaluation             End of Session Equipment Utilized During Treatment: Gait belt Activity Tolerance:  Patient tolerated treatment well Patient left: in bed;with bed alarm set;with call bell/phone within reach (returned to bed for EEG)     Time: [id number] PT Time Calculation (min) (ACUTE ONLY): 23 min  Charges:  $Gait Training: 8-22 mins $Therapeutic Activity: 8-22 mins                    G Codes:      Delorse Lekabor, Baer Hinton Beth 04/26/2016, 10:01 AM Delaney MeigsMaija Tabor Asma Boldon, PT [phone]

## 2016-04-26 NOTE — Progress Notes (Signed)
STROKE TEAM PROGRESS NOTE   SUBJECTIVE (INTERVAL HISTORY) No family at bedside. On heparin IV and tolerating well, no neuro changes. BP stable. Glucose elevated and noted that she had regular diet. Changed to DM diet.     OBJECTIVE Temp:  [97.7 F (36.5 C)-98.3 F (36.8 C)] 97.7 F (36.5 C) (08/18 1149) Pulse Rate:  [60-93] 89 (08/18 1000) Cardiac Rhythm: Normal sinus rhythm (08/18 0756) Resp:  [12-27] 13 (08/18 1000) BP: (89-144)/(52-106) 89/52 (08/18 1000) SpO2:  [89 %-99 %] 96 % (08/18 1000)  CBC:   Recent Labs Lab 04/24/16 0623 04/26/16 0527  WBC 13.9* 9.8  HGB 16.3* 14.7  HCT 47.8* 45.1  MCV 93.2 95.8  PLT 268 232    Basic Metabolic Panel:   Recent Labs Lab 04/23/16 0734 04/24/16 0623  NA 136 137  K 3.6 3.6  CL 95* 97*  CO2 28 27  GLUCOSE 210* 199*  BUN 14 10  CREATININE 0.57 0.59  CALCIUM 9.7 9.8    Lipid Panel:     Component Value Date/Time   CHOL 118 04/22/2016 0357   TRIG 91 04/22/2016 0357   HDL 40 (L) 04/22/2016 0357   CHOLHDL 3.0 04/22/2016 0357   VLDL 18 04/22/2016 0357   LDLCALC 60 04/22/2016 0357    IMAGING I have personally reviewed the radiological images below and agree with the radiology interpretations.  Ct Head Wo Contrast 04/24/2016 Left posterior temporal hemorrhage unchanged. There is possible thrombosis in the left transverse sinus as the cause of the hemorrhage. CT venogram recommended for confirmation.  04/18/2016 No significant change since the prior study with 2.8 x 4.1 cm left parietal intraparenchymal hemorrhage with intraventricular extension and 2 mm left to right midline shift with some effacement of the left basilar cisterns.   04/18/2016 Large intraparenchymal hemorrhage is noted in left posterior parietal cortex with extension into the left lateral and third ventricles. Small amount of left-sided subarachnoid hemorrhage is noted. 4 mm of left-to-right midline shift is noted.   MRI HEAD  04/20/2016 1. Similar size  and appearance of acute right temporal occipital intraparenchymal hematoma measuring 4.0 x 2.8 x 2.7 cm (estimated volume 15 cc). Similar localized edema with trace 3 mm left-to-right shift.  2. Intraventricular extension with blood in the lateral and third ventricles. No evidence for hydrocephalus or ventricular trapping.  3. Small volume acute subarachnoid hemorrhage within the posterior cerebral hemispheres bilaterally, stable from prior CT.  4. Trace left subdural hemorrhage measuring up to 2 mm, difficult to visualize on prior study, but felt to be unchanged.   MRA HEAD  04/20/2016 1. Negative intracranial MRA. No vascular abnormality identified underlying the acute hemorrhage.  2. No large or proximal arterial branch occlusion. No high-grade or correctable stenosis.  3. Multi focal atheromatous irregularity involving the anterior posterior circulations as above, predominantly involving the distal small vessels.   MRV Head 04/24/2016 Filling defect at the junction of the LEFT transverse and sigmoid sinus consistent with nonocclusive LEFT dural venous sinus thrombosis.  2D Echocardiogram  - Procedure narrative: Transthoracic echocardiography. Image quality was poor. The study was technically difficult, as a result of poor sound wave transmission.  - Left ventricle: The cavity size was normal. Wall thickness was increased in a pattern of mild LVH. There was mild focal basal hypertrophy of the septum. Systolic function was vigorous. The estimated ejection fraction was in the range of 65% to 70%. Wall motion was normal; there were no regional wall motion abnormalities. Doppler parameters are consistent  with abnormal left ventricular relaxation (grade 1 diastolic dysfunction). Impressions:   Vigorous LV systolic function; grade 1 diastolic dysfunction.  EEG 04/24/16 1) left temporal sharp wave discharges 2) generalized irregular slow activity Clinical Interpretation: This EEG indicates a  potential seizure focus in the left temporal region. No seizure was recorded on this study.  EEG 04/26/16 This awake and asleep EEG is abnormal due to the presence of: 1. Moderate diffuse slowing of the waking background 2. Frontal intermittent rhythmic delta activity (FIRDA) Clinical Correlation of the above findings indicates diffuse cerebral dysfunction that is non-specific in etiology and can be seen with hypoxic/ischemic injury, toxic/metabolic encephalopathies, or medication effect. FIRDA can be seen with neuronal dysfunction from metabolic encephalopathy, ischemic injury, increased intracranial pressure, or from deep diencephalic or brainstem lesions. The absence of epileptiform discharges does not rule out a clinical diagnosis of epilepsy.  Clinical correlation is advised.   PHYSICAL EXAM Obese middle aged caucasian lady not in distress. Afebrile. Head is nontraumatic. Neck is supple without bruit. Cardiac exam no murmur or gallop. Lungs are clear to auscultation. Distal pulses are well felt. Neurological Exam :  Sleepy drowsy today after Xanax and dilaudid. Orientated to self but not orientated to age, place and time. Unable to name the current president or name the hospital. Fluent when speaks, but comprehension is impaired, including with complex multistep commands. Naming is impaired with perseveration. Repetition is normal.  Pupils equal reactive. Fundi were not visualized. Visual field appears to be intact although slow reaction. Facial symmetrical. Tongue midline. Motor symmetrical bilaterally 4/5, but with significant bilateral asterixis. Sensation symmetrical. DTR 1+, no babinski. Coordination intact but slow on the right. Gait was not tested.   ASSESSMENT/PLAN Stephanie [name] is a 50 y.o. female with history of hepatitis C,sciatica, CHF, and drug seeking behavior presenting to [place] with complaints of diffuse pain and HA. CT showed a moderate L parieto-occipital hemorrhage  with IVH. [facility]  ICH:  moderate left parieto-occipital hemorrhage with resultant IVH, SAH, secondary to L transverse sinus thrombosis  Resultant - confused with HA  Neurosurgery consult - no surgery or IVC, manage BP - signed off 04/22/2016  CT at [place] moderate L posterior parietal cortex hemorrhage with IVH, small SAH L temporal and posterior parietal. 4mm L shift  Repeat CT head no significant change, 2mm L shift with some effacement of L basilar cistern  MRI without contrast - acute left temporal occipital intraparenchymal hematoma, L to R shift (3mm), IVH, small volume SAH, SDH  MRA - negative  Repeat CT head this a.m. Unchanged, no hydrocephalus but concerning for left transverse sinus thrombosis  MRV nonocclusive L transverse sinus thrombosis  LDL 60  HgbA1c 8.1  2D Echo - EF 65-70%. No cardiac source of emboli identified.  heparin IV for VTE prophylaxis  Diet heart healthy/carb modified Room service appropriate? Yes; Fluid consistency: Thin   BC Powders prior to admission  Start IV heparin per pharmacy, no bolus, level 0.3-0.5  Transfer pt to stepdown ICU for increased frequency VS and neuro checks given heparin in setting of ICH  Ongoing aggressive stroke risk factor management  Therapy recommendations:  SNF. Patient refused SNF.  Disposition:  pending   Headache  Repeat CT head Unchanged, no hydrocephalus  On Fioricet when necessary.   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   Abnormal EEG  EEG positive for potential area of  epileptogenicity  on Keppra bid  Repeat EEG no seizure like activity - FIRDA and diffuse slowing  Hypertensive Emergency  BP 203/116 on arrival to [facility] in setting of neurologic symptoms  off cardene drip.   on norvasc 10 qd, losartan 50 bid and labetalol 100mg  tid  Stable   Long-term BP goal  normotensive  Diabetes  HgbA1c 8.1, goal < 7.0  Uncontrolled  CBG monitoring  On SSI  On glucotrol, increased to bid dosing  Changed diet to DM diet  Appreciate DM RN recs  Tobacco abuse  Current smoker  Smoking cessation counseling provided  Pt is willing to quit  Other Stroke Risk Factors  Obesity, Body mass index is 36.28 kg/m.  recommend weight loss, diet and exercise as appropriate   Congestive Heart Failure  Hypokalemia 3.4 -> 3.6  Hospital day # 8   Marvel PlanJindong Itzae Mccurdy, MD PhD Stroke Neurology 04/26/2016 1:02 PM      To contact Stroke Continuity provider, please refer to [email or web address]. After hours, contact General Neurology

## 2016-04-26 NOTE — Progress Notes (Signed)
Occupational Therapy Treatment Patient Details Name: Stephanie GuerinDianna B [name] MRN: [id number] DOB: 25-Jan-1966 Today's Date: 04/26/2016    History of present illness Stephanie B Phillipsis an 50 y.o.femaleadmitted with Left parieto-occipital hematoma. PMHx: hepatitis C,sciatica, recent history of congestive heart failure, and drug seeking behavior. Pt with right carpal tunnel release and De Quervain release on 04/15/16.  Has splint on right UE.     OT comments  Pt demonstrates significant cognitive deficits including deficits with sequencing, very poor awareness of deficits and poor safety awareness.  She required max cues to sequence brushing her teeth.   She is unable to read - misreads words and adds words that aren't there, and unable to cue her as she insists she is fine - she will not be able to safely manage medications, and am concerned about her ability to perform meal prep.  Perseveration also noted.  Pt will NOT be safe at home without 24 hour close supervision/assist as she requires max cues for simple, familiar tasks.  Per her report spouse works during the day.  Recommend SNF unless spouse is able to confirm that she indeed will have 24 hour assist.  Recommend NO DRIVING  Follow Up Recommendations  SNF is optimal recommendation for pt safety.  IF she discharges home she will need 24 hour Supervision/Assistance - 24 hour;Home health OT    Equipment Recommendations  None recommended by OT    Recommendations for Other Services      Precautions / Restrictions Precautions Precautions: Fall Other Brace/Splint: Pt reports her surgeon told her to wear the splint for a week, and she has now discontinued wear        Mobility Bed Mobility Overal bed mobility: Modified Independent                Transfers Overall transfer level: Needs assistance     Sit to Stand: Supervision         General transfer comment: supervision for safety and lines    Balance     Sitting  balance-Leahy Scale: Good       Standing balance-Leahy Scale: Good               High level balance activites: Backward walking;Direction changes;Sudden stops;Head turns;Side stepping High Level Balance Comments: minguard for side stepping, 360 degree turns and backward walking. no deficits with head turns & speed changes   ADL Overall ADL's : Needs assistance/impaired     Grooming: Oral care;Minimal assistance;Standing (max verbal cues ) Grooming Details (indicate cue type and reason): Pt required max cues to locate toothbrush and toothpaste on sink edge.  She attempted to apply toothpaste to toothbrush without removing the cap, and unable to recognize error.  Once error pointed out, she removed cap, and proceeded to squeeze toothpaste into cap without awareness.  when deficits pointed out to her, she became irritated, turned cap over and proceeded to fill that end with toothpaste.  Again, unable to detect errors. Unable to determine where the toothpaste is to go, and unable to locate toothbrush which was in her Lt hand.  Pt assisted with applying toothpaste to brush and then was able to perform task with min guard assist.                  Toilet Transfer: Min guard;Ambulation;Comfort height toilet   Toileting- Clothing Manipulation and Hygiene: Min guard;Sit to/from stand       Functional mobility during ADLs: Min guard General ADL Comments: Deficits discussed with pt  in attempts of building awareness, but pt is only able to identify deficits in the moment she is making them, when they are pointed out.  She is unable to generalize how deficits may impact her ability to function.  Discussed with her the need for 24 hour supervision/assist, but pt does not appear to fully understand this recommendation       Vision                 Additional Comments: Pt requires mod cues to locate items on Rt.  Pt unable to read menu accurately.  She frequently misreads words and  inserts words often reading words that dont exist on the page    Perception     Praxis      Cognition   Behavior During Therapy: Impulsive (irritable ) Overall Cognitive Status: Impaired/Different from baseline Area of Impairment: Attention;Safety/judgement;Following commands;Awareness;Problem solving Orientation Level: Place Current Attention Level: Sustained Memory: Decreased short-term memory  Following Commands: Follows one step commands consistently;Follows multi-step commands inconsistently Safety/Judgement: Decreased awareness of safety;Decreased awareness of deficits Awareness: Intellectual Problem Solving: Difficulty sequencing;Requires verbal cues;Requires tactile cues General Comments: Pt with poor awareness of lines.  Max cues to sequence brushing teeth.  Denies deficits even when errors presented to her.  Pt very tangential with conversation and difficulty maintaining attention to task at hand     Extremity/Trunk Assessment               Exercises     Shoulder Instructions       General Comments      Pertinent Vitals/ Pain       Pain Assessment: Faces Pain Score: 6  Faces Pain Scale: Hurts even more Pain Location: headache Pain Descriptors / Indicators: Headache Pain Intervention(s): Monitored during session  Home Living                                          Prior Functioning/Environment              Frequency Min 2X/week     Progress Toward Goals  OT Goals(current goals can now be found in the care plan section)  Progress towards OT goals: Progressing toward goals  ADL Goals Pt Will Perform Grooming: with supervision;standing Pt Will Perform Upper Body Bathing: with supervision;sitting Pt Will Perform Lower Body Bathing: with supervision;sit to/from stand Pt Will Transfer to Toilet: with supervision;ambulating;regular height toilet Pt Will Perform Toileting - Clothing Manipulation and hygiene: with supervision;sit  to/from stand Additional ADL Goal #1: Pt will demonstrate emergent awareness during ADL task with min verbal cues for sequencing. Additional ADL Goal #2: Pt will correctly identify 5/5 objects as precursor to ADL task.  Plan Discharge plan needs to be updated    Co-evaluation                 End of Session Equipment Utilized During Treatment: Gait belt   Activity Tolerance Patient tolerated treatment well   Patient Left in bed;with call bell/phone within reach;with bed alarm set   Nurse Communication Mobility status        Time: [id number] OT Time Calculation (min): 37 min  Charges: OT General Charges $OT Visit: 1 Procedure OT Treatments $Self Care/Home Management : 23-37 mins  Stephanie [name] M 04/26/2016, 1:47 PM

## 2016-04-26 NOTE — Procedures (Signed)
ELECTROENCEPHALOGRAM REPORT  Date of Study: 04/26/2016  Patient's Name: Stephanie [name] MRN: [id number] Date of Birth: 1965/10/06  Referring Provider: Dr. Marvel PlanJindong Xu  Clinical History: This is a 50 year old woman with intracranial bleed. Patient started on Keppra, follow-up EEG ordered.  Medications: levETIRAcetam (KEPPRA) tablet 500 mg  acetaminophen (TYLENOL) tablet 650 mg  ALPRAZolam (XANAX) tablet 1 mg  amLODipine (NORVASC) tablet 10 mg  busPIRone (BUSPAR) tablet 10 mg  butalbital-acetaminophen-caffeine (FIORICET, ESGIC) 50-325-40 MG per tablet 1 tablet  dicyclomine (BENTYL) tablet 20 mg  DULoxetine (CYMBALTA) DR capsule 60 mg  glipiZIDE (GLUCOTROL) tablet 5 mg  labetalol (NORMODYNE) tablet 100 mg  losartan (COZAAR) tablet 50 mg  ondansetron (ZOFRAN) injection 4 mg  pantoprazole (PROTONIX) EC tablet 80 mg   Technical Summary: A multichannel digital EEG recording measured by the international 10-20 system with electrodes applied with paste and impedances below 5000 ohms performed as portable with EKG monitoring in an awake and asleep patient.  Hyperventilation and photic stimulation were not performed.  The digital EEG was referentially recorded, reformatted, and digitally filtered in a variety of bipolar and referential montages for optimal display.   Description: The patient is awake and asleep during the recording.  During maximal wakefulness, there is a symmetric, medium voltage 7 Hz posterior dominant rhythm that poorly attenuates with eye opening and eye closure. This is admixed with a moderate amount of diffuse 4-5 Hz theta and 2-3 Hz delta slowing of the waking background with frontal intermittent rhythmic delta activity (FIRDA) seen. During drowsiness and sleep, there is an increase in theta and delta slowing of the background. Hyperventilation and photic stimulation were not performed.  There were no epileptiform discharges or electrographic seizures seen.    EKG lead  was unremarkable.  Impression: This awake and asleep EEG is abnormal due to the presence of: 1. Moderate diffuse slowing of the waking background 2. Frontal intermittent rhythmic delta activity (FIRDA)  Clinical Correlation of the above findings indicates diffuse cerebral dysfunction that is non-specific in etiology and can be seen with hypoxic/ischemic injury, toxic/metabolic encephalopathies, or medication effect. FIRDA can be seen with neuronal dysfunction from metabolic encephalopathy, ischemic injury, increased intracranial pressure, or from deep diencephalic or brainstem lesions. The absence of epileptiform discharges does not rule out a clinical diagnosis of epilepsy.  Clinical correlation is advised.   Patrcia DollyKaren Fillmore Bynum, M.D.

## 2016-04-26 NOTE — Progress Notes (Signed)
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 Aspirin Itching  . Buprenorphine Hcl Itching  . Codeine Other (See Comments)    Makes her feel like something is crawling under her skin. Can take, sometimes makes her itch  . Compazine Itching    "makes my skin crawl"  . Ioxaglate Itching  . Ivp Dye [Iodinated Diagnostic Agents] Itching    Makes her itch really bad. Had to get 2-3 shots of Benadryl when she was in the hospital.  . Metrizamide Itching  . Morphine And Related Itching  . Naproxen Itching  . Norco [Hydrocodone-Acetaminophen] Itching  . Penicillins Itching and Other (See Comments)    Makes her feel really uncomfortable. No other information available.  . Prochlorperazine Maleate Other (See Comments)    Compazine makes her skin crawl - needs 2-3 shots of Benadryl to get relief.  . Reglan [Metoclopramide] Itching and Other (See Comments)    Makes her skin crawl  . Tegretol [Carbamazepine] Itching and Other (See Comments)    Makes her feel like something is crawling under her skin.  . Toradol [Ketorolac Tromethamine] Itching  . Tramadol Itching    Patient Measurements: Height: 5\' 3"  (160 cm) Weight: 204 lb 12.8 oz (92.9 kg) IBW/kg (Calculated) : 52.4  Vital Signs: Temp: 97.7 F (36.5 C) (08/18 1149) Temp Source: Oral (08/18 1149) BP: 101/89 (08/18 1300) Pulse Rate: 75 (08/18 1300)  Labs:  Recent Labs  04/24/16 0623 04/25/16 2202 04/26/16 0527 04/26/16 1210  HGB 16.3*  --  14.7  --   HCT 47.8*  --  45.1  --   PLT 268  --  232  --   HEPARINUNFRC  --  0.15* 0.41 0.34  CREATININE 0.59  --   --   --     Estimated Creatinine Clearance: 90.1 mL/min (by C-G formula based on SCr of 0.8 mg/dL).   Assessment: Heparin for venous sinus thrombosis in setting of ICH- being conservative with dosing.   Heparin level is therapeutic x2 on 1150  units/hr. H/H ok. Platelets stable. No bleeding noted.   Goal of Therapy:  Heparin level 0.3-0.5 units/ml Monitor platelets by anticoagulation protocol: Yes   Plan:  Continue heparin at 1150 units/hr Daily HL and CBC Follow-up imaging  Link SnufferJessica Nykerria Macconnell, PharmD, BCPS Clinical Pharmacist [phone] 04/26/2016,1:17 PM

## 2016-04-26 NOTE — Progress Notes (Signed)
EEG completed, results pending. 

## 2016-04-26 NOTE — Care Management Note (Signed)
Case Management Note  Patient Details  Name: Stephanie GuerinDianna B [name] MRN: [id number] Date of Birth: [birth year]  Subjective/Objective:    Pt admitted with hemmorrhage                Action/Plan:  PTA from home with husband.  Pt stated she had mobility issues prior to discharge.  Pt still refusing SNF - states her husband will drop her off at best friend Dawns home while he is workng during the day - then he will be with her evenings and night.  Pt states she already has walker and cane in the home.  CM communicated with therapy that pt is refusing SNF.  Recommendation for Outpt PT and HHOT - due to safteryu pt is requesting hh instead of outpt - [facility] - CM will communicate this with therapy.  CM will ask for orders from attending.     Expected Discharge Date:                  Expected Discharge Plan:  Home w Home Health Services  In-House Referral:     Discharge planning Services  CM Consult  Post Acute Care Choice:    Choice offered to:     DME Arranged:    DME Agency:     HH Arranged:    HH Agency:     Status of Service:  In process, will continue to follow  If discussed at Long Length of Stay Meetings, dates discussed:    Additional Comments:  Cherylann ParrClaxton, Curtis Uriarte S, RN 04/26/2016, 2:31 PM

## 2016-04-27 DIAGNOSIS — I619 Nontraumatic intracerebral hemorrhage, unspecified: Secondary | ICD-10-CM

## 2016-04-27 LAB — CBC
HCT: 43.8 % (ref 36.0–46.0)
Hemoglobin: 14.5 g/dL (ref 12.0–15.0)
MCH: 31.5 pg (ref 26.0–34.0)
MCHC: 33.1 g/dL (ref 30.0–36.0)
MCV: 95.2 fL (ref 78.0–100.0)
PLATELETS: 227 10*3/uL (ref 150–400)
RBC: 4.6 MIL/uL (ref 3.87–5.11)
RDW: 12.9 % (ref 11.5–15.5)
WBC: 10.1 10*3/uL (ref 4.0–10.5)

## 2016-04-27 LAB — GLUCOSE, CAPILLARY
GLUCOSE-CAPILLARY: 106 mg/dL — AB (ref 65–99)
Glucose-Capillary: 112 mg/dL — ABNORMAL HIGH (ref 65–99)
Glucose-Capillary: 136 mg/dL — ABNORMAL HIGH (ref 65–99)
Glucose-Capillary: 157 mg/dL — ABNORMAL HIGH (ref 65–99)
Glucose-Capillary: 180 mg/dL — ABNORMAL HIGH (ref 65–99)

## 2016-04-27 LAB — BASIC METABOLIC PANEL WITH GFR
Anion gap: 10 (ref 5–15)
BUN: 8 mg/dL (ref 6–20)
CO2: 26 mmol/L (ref 22–32)
Calcium: 9.6 mg/dL (ref 8.9–10.3)
Chloride: 101 mmol/L (ref 101–111)
Creatinine, Ser: 0.61 mg/dL (ref 0.44–1.00)
GFR calc Af Amer: >60 mL/min
GFR calc non Af Amer: >60 mL/min
Glucose, Bld: 157 mg/dL — ABNORMAL HIGH (ref 65–99)
Potassium: 3.6 mmol/L (ref 3.5–5.1)
Sodium: 137 mmol/L (ref 135–145)

## 2016-04-27 LAB — AMMONIA: Ammonia: 20 umol/L (ref 9–35)

## 2016-04-27 LAB — HEPARIN LEVEL (UNFRACTIONATED): Heparin Unfractionated: 0.34 [IU]/mL (ref 0.30–0.70)

## 2016-04-27 NOTE — Progress Notes (Signed)
STROKE TEAM PROGRESS NOTE   HPI:                                                                                                                                          Stephanie [name] is an 50 y.o. female who presented to [place] with a complaint of diffuse pain as well as headache. She has a known history of hepatitis C, sciatica, recent history of congestive heart failure, and drug seeking behavior. She presented to the emergency room with a complaint of headache and diffuse pain. A CT of the brain was performed and revealed the presence of a large left parieto-occipital hematoma with significant intraventricular extension. [facility]   SUBJECTIVE (INTERVAL HISTORY) No family at bedside. On heparin IV and tolerating well, no neuro changes. BP stable. RN at bedside.  RN reports that patient has some impulsive behavior that seems to be related to decreased insight and judgement regarding her condition.  OBJECTIVE Temp:  [97.5 F (36.4 C)-98 F (36.7 C)] 97.5 F (36.4 C) (08/19 0821) Pulse Rate:  [66-106] 82 (08/19 0700) Cardiac Rhythm: Normal sinus rhythm (08/19 0400) Resp:  [13-32] 19 (08/19 0700) BP: (89-157)/(52-133) 157/96 (08/19 0600) SpO2:  [95 %-100 %] 97 % (08/19 0700)  CBC:   Recent Labs Lab 04/26/16 0527 04/27/16 0246  WBC 9.8 10.1  HGB 14.7 14.5  HCT 45.1 43.8  MCV 95.8 95.2  PLT 232 227    Basic Metabolic Panel:   Recent Labs Lab 04/24/16 0623 04/27/16 0246  NA 137 137  K 3.6 3.6  CL 97* 101  CO2 27 26  GLUCOSE 199* 157*  BUN 10 8  CREATININE 0.59 0.61  CALCIUM 9.8 9.6    Lipid Panel:     Component Value Date/Time   CHOL 118 04/22/2016 0357   TRIG 91 04/22/2016 0357   HDL 40 (L) 04/22/2016 0357   CHOLHDL 3.0 04/22/2016 0357   VLDL 18 04/22/2016 0357   LDLCALC 60 04/22/2016 0357    IMAGING I have personally reviewed the radiological images below and agree with the radiology  interpretations.  Ct Head Wo Contrast 04/24/2016 Left posterior temporal hemorrhage unchanged. There is possible thrombosis in the left transverse sinus as the cause of the hemorrhage. CT venogram recommended for confirmation.  04/18/2016 No significant change since the prior study with 2.8 x 4.1 cm left parietal intraparenchymal hemorrhage with intraventricular extension and 2 mm left to right midline shift with some effacement of the left basilar cisterns.   04/18/2016 Large intraparenchymal hemorrhage is noted in left posterior parietal cortex with extension into the left lateral and third ventricles. Small amount of left-sided subarachnoid hemorrhage is noted. 4 mm of left-to-right midline shift is noted.   MRI HEAD  04/20/2016 1. Similar size and appearance of acute right temporal occipital intraparenchymal hematoma measuring 4.0 x  2.8 x 2.7 cm (estimated volume 15 cc). Similar localized edema with trace 3 mm left-to-right shift.  2. Intraventricular extension with blood in the lateral and third ventricles. No evidence for hydrocephalus or ventricular trapping.  3. Small volume acute subarachnoid hemorrhage within the posterior cerebral hemispheres bilaterally, stable from prior CT.  4. Trace left subdural hemorrhage measuring up to 2 mm, difficult to visualize on prior study, but felt to be unchanged.   MRA HEAD  04/20/2016 1. Negative intracranial MRA. No vascular abnormality identified underlying the acute hemorrhage.  2. No large or proximal arterial branch occlusion. No high-grade or correctable stenosis.  3. Multi focal atheromatous irregularity involving the anterior posterior circulations as above, predominantly involving the distal small vessels.   MRV Head 04/24/2016 Filling defect at the junction of the LEFT transverse and sigmoid sinus consistent with nonocclusive LEFT dural venous sinus thrombosis.  2D Echocardiogram  - Procedure narrative: Transthoracic echocardiography.  Image quality was poor. The study was technically difficult, as a result of poor sound wave transmission.  - Left ventricle: The cavity size was normal. Wall thickness was increased in a pattern of mild LVH. There was mild focal basal hypertrophy of the septum. Systolic function was vigorous. The estimated ejection fraction was in the range of 65% to 70%. Wall motion was normal; there were no regional wall motion abnormalities. Doppler parameters are consistent with abnormal left ventricular relaxation (grade 1 diastolic dysfunction). Impressions:   Vigorous LV systolic function; grade 1 diastolic dysfunction.  EEG 04/24/16 1) left temporal sharp wave discharges 2) generalized irregular slow activity Clinical Interpretation: This EEG indicates a potential seizure focus in the left temporal region. No seizure was recorded on this study.  EEG 04/26/16 This awake and asleep EEG is abnormal due to the presence of: 1. Moderate diffuse slowing of the waking background 2. Frontal intermittent rhythmic delta activity (FIRDA) Clinical Correlation of the above findings indicates diffuse cerebral dysfunction that is non-specific in etiology and can be seen with hypoxic/ischemic injury, toxic/metabolic encephalopathies, or medication effect. FIRDA can be seen with neuronal dysfunction from metabolic encephalopathy, ischemic injury, increased intracranial pressure, or from deep diencephalic or brainstem lesions. The absence of epileptiform discharges does not rule out a clinical diagnosis of epilepsy.  Clinical correlation is advised.   PHYSICAL EXAM Obese middle aged caucasian lady not in distress. Afebrile. Head is nontraumatic. Neck is supple without bruit.  Cardiac exam no murmur or gallop.  Lungs are clear to auscultation.  Abdomen:  Good bowel sounds Distal pulses are well felt.  Neurological Exam :  Awake and alert.  Standing with RN at bedside upon my arrival;   Orientated to self, month/year and  "[place]"  Able to name the current president today Fluent speech Follows simple command but seems distracted and confused by complex multistep commands.   Pupils equal reactive. Visual fields appear grossly intact.  Facial symmetric. Shrug greater on left than right. SCMs intact bilaterally.   Motor symmetric bilaterally 4/5, but with significant bilateral asterixis.   Sensation symmetric to light touch  Gait observed to be slow and wide based   ASSESSMENT/PLAN [name] is a 50 y.o. female with history of hepatitis C,sciatica, CHF, and drug seeking behavior presenting to [place] with complaints of diffuse pain and HA. CT showed a moderate L parieto-occipital hemorrhage with IVH. She was transferred to [facility] for admission  ICH:  moderate left parieto-occipital hemorrhage with resultant IVH, SAH, secondary to L transverse sinus thrombosis  Resultant -  confused with HA  Neurosurgery consult - no surgery or IVC, manage BP - signed off 04/22/2016  CT at [place] moderate L posterior parietal cortex hemorrhage with IVH, small SAH L temporal and posterior parietal. 4mm L shift  Repeat CT head no significant change, 2mm L shift with some effacement of L basilar cistern  MRI without contrast - acute left temporal occipital intraparenchymal hematoma, L to R shift (3mm), IVH, small volume SAH, SDH  MRA - negative  Repeat CT head 04/24/2016. Unchanged, no hydrocephalus but concerning for left transverse sinus thrombosis  MRV nonocclusive L transverse sinus thrombosis  LDL 60  HgbA1c 8.1  2D Echo - EF 65-70%. No cardiac source of emboli identified.  heparin IV for VTE prophylaxis  Diet heart healthy/carb modified Room service appropriate? Yes; Fluid consistency: Thin   BC Powders prior to admission  Start IV heparin per pharmacy, no bolus, level 0.3-0.5  Transfer pt to stepdown ICU for increased frequency VS and neuro checks given heparin in setting of  ICH  Ongoing aggressive stroke risk factor management  Therapy recommendations:  SNF. Patient refused SNF.  Disposition:  pending   Headache  Repeat CT head Unchanged, no hydrocephalus  On Fioricet when necessary.   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   Abnormal EEG  EEG positive for potential area of epileptogenicity  On Keppra bid  Repeat EEG no seizure like activity - FIRDA and diffuse slowing  Hypertensive Emergency  BP 203/116 on arrival to [place] in setting of neurologic symptoms  Off cardene drip.   On norvasc 10 qd, losartan 50 bid and labetalol 100mg  tid  Stable - BP 157/96 on 04/27/2016  Long-term BP goal normotensive  Diabetes  HgbA1c 8.1, goal < 7.0  Uncontrolled  CBG monitoring  On SSI  On glucotrol, increased to bid dosing  Changed diet to DM diet  Appreciate DM RN recs  Tobacco abuse  Current smoker  Smoking cessation counseling provided  Pt is willing to quit  Other Stroke Risk Factors  Obesity, Body mass index is 36.28 kg/m.  recommend weight loss, diet and exercise as appropriate   Congestive Heart Failure  Hypokalemia 3.4 -> 3.6  Hospital day # 9  ATTENDING NOTE: Patient was seen and examined by me personally. Documentation reflects findings. The laboratory and radiographic studies reviewed by me. ROS completed by me personally and pertinent positives fully documented  Condition: somewhat improved  Assessment and plan completed by me personally and fully documented above. Plans/Recommendations include:     Continue heparin;   Continue in monitored setting  Hypokalemia improved; continue to monitor  Plan to discuss NOAC management for cerebral venous thrombosis as plan to initiate anticoagulation on Monday if patient remains stable  Ammonia level sent  SIGNED BY: Dr. Sula Sodahere Knoah Nedeau       To contact Stroke Continuity provider, please refer to  [email or web address]. After hours, contact General Neurology

## 2016-04-27 NOTE — Progress Notes (Signed)
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 Aspirin Itching  . Buprenorphine Hcl Itching  . Codeine Other (See Comments)    Makes her feel like something is crawling under her skin. Can take, sometimes makes her itch  . Compazine Itching    "makes my skin crawl"  . Ioxaglate Itching  . Ivp Dye [Iodinated Diagnostic Agents] Itching    Makes her itch really bad. Had to get 2-3 shots of Benadryl when she was in the hospital.  . Metrizamide Itching  . Morphine And Related Itching  . Naproxen Itching  . Norco [Hydrocodone-Acetaminophen] Itching  . Penicillins Itching and Other (See Comments)    Makes her feel really uncomfortable. No other information available.  . Prochlorperazine Maleate Other (See Comments)    Compazine makes her skin crawl - needs 2-3 shots of Benadryl to get relief.  . Reglan [Metoclopramide] Itching and Other (See Comments)    Makes her skin crawl  . Tegretol [Carbamazepine] Itching and Other (See Comments)    Makes her feel like something is crawling under her skin.  . Toradol [Ketorolac Tromethamine] Itching  . Tramadol Itching    Patient Measurements: Height: 5\' 3"  (160 cm) Weight: 204 lb 12.8 oz (92.9 kg) IBW/kg (Calculated) : 52.4  Vital Signs: Temp: 97.5 F (36.4 C) (08/19 0821) Temp Source: Oral (08/19 0821) BP: 157/96 (08/19 0600) Pulse Rate: 82 (08/19 0700)  Labs:  Recent Labs  04/26/16 0527 04/26/16 1210 04/27/16 0246  HGB 14.7  --  14.5  HCT 45.1  --  43.8  PLT 232  --  227  HEPARINUNFRC 0.41 0.34 0.34  CREATININE  --   --  0.61    Estimated Creatinine Clearance: 90.1 mL/min (by C-G formula based on SCr of 0.8 mg/dL).  Assessment: 50 yof continues on IV heparin for venous sinus thrombosis in setting of ICH. CBC is WNL and no new bleeding noted. Heparin level remains therapeutic.  Goal of Therapy:   Heparin level 0.3-0.5 units/ml Monitor platelets by anticoagulation protocol: Yes   Plan:  Continue heparin at 1150 units/hr Daily HL and CBC  Lysle Pearlachel Stellarose Cerny, PharmD, BCPS Pager # [phone] 04/27/2016 10:41 AM

## 2016-04-28 ENCOUNTER — Inpatient Hospital Stay (HOSPITAL_COMMUNITY): Payer: 59

## 2016-04-28 DIAGNOSIS — I619 Nontraumatic intracerebral hemorrhage, unspecified: Secondary | ICD-10-CM

## 2016-04-28 DIAGNOSIS — G08 Intracranial and intraspinal phlebitis and thrombophlebitis: Secondary | ICD-10-CM

## 2016-04-28 LAB — COMPREHENSIVE METABOLIC PANEL
ALBUMIN: 3.7 g/dL (ref 3.5–5.0)
ALK PHOS: 73 U/L (ref 38–126)
ALT: 37 U/L (ref 14–54)
AST: 23 U/L (ref 15–41)
Anion gap: 13 (ref 5–15)
BUN: 6 mg/dL (ref 6–20)
CALCIUM: 9.9 mg/dL (ref 8.9–10.3)
CO2: 25 mmol/L (ref 22–32)
CREATININE: 0.55 mg/dL (ref 0.44–1.00)
Chloride: 102 mmol/L (ref 101–111)
GFR calc Af Amer: >60 mL/min (ref >60)
GFR calc non Af Amer: >60 mL/min (ref >60)
GLUCOSE: 114 mg/dL — AB (ref 65–99)
Potassium: 3.2 mmol/L — ABNORMAL LOW (ref 3.5–5.1)
SODIUM: 140 mmol/L (ref 135–145)
Total Bilirubin: 0.4 mg/dL (ref 0.3–1.2)
Total Protein: 6.9 g/dL (ref 6.5–8.1)

## 2016-04-28 LAB — PROTIME-INR
INR: 1.01
PROTHROMBIN TIME: 13.3 s (ref 11.4–15.2)

## 2016-04-28 LAB — CBC
HCT: 45.5 % (ref 36.0–46.0)
HEMOGLOBIN: 14.9 g/dL (ref 12.0–15.0)
MCH: 31.3 pg (ref 26.0–34.0)
MCHC: 32.7 g/dL (ref 30.0–36.0)
MCV: 95.6 fL (ref 78.0–100.0)
Platelets: 236 10*3/uL (ref 150–400)
RBC: 4.76 MIL/uL (ref 3.87–5.11)
RDW: 12.8 % (ref 11.5–15.5)
WBC: 8.3 10*3/uL (ref 4.0–10.5)

## 2016-04-28 LAB — GLUCOSE, CAPILLARY
GLUCOSE-CAPILLARY: 117 mg/dL — AB (ref 65–99)
GLUCOSE-CAPILLARY: 141 mg/dL — AB (ref 65–99)
GLUCOSE-CAPILLARY: 156 mg/dL — AB (ref 65–99)
GLUCOSE-CAPILLARY: 86 mg/dL (ref 65–99)
Glucose-Capillary: 163 mg/dL — ABNORMAL HIGH (ref 65–99)
Glucose-Capillary: 119 mg/dL — ABNORMAL HIGH (ref 65–99)

## 2016-04-28 LAB — HEPARIN LEVEL (UNFRACTIONATED): HEPARIN UNFRACTIONATED: 0.39 [IU]/mL (ref 0.30–0.70)

## 2016-04-28 MED ORDER — HYDROMORPHONE HCL 1 MG/ML IJ SOLN
0.5000 mg | Freq: Every day | INTRAMUSCULAR | Status: DC | PRN
  Administered 2016-04-28 – 2016-04-29 (×2): 0.5 mg via INTRAVENOUS
  Filled 2016-04-28 (×3): qty 1

## 2016-04-28 MED ORDER — BUTALBITAL-APAP-CAFFEINE 50-325-40 MG PO TABS
1.0000 | ORAL_TABLET | Freq: Four times a day (QID) | ORAL | Status: DC | PRN
  Administered 2016-04-28 – 2016-04-30 (×2): 1 via ORAL
  Filled 2016-04-28 (×4): qty 1

## 2016-04-28 MED ORDER — POTASSIUM CHLORIDE 20 MEQ PO PACK
20.0000 meq | PACK | Freq: Two times a day (BID) | ORAL | Status: DC
  Administered 2016-04-28 – 2016-04-29 (×4): 20 meq via ORAL
  Filled 2016-04-28 (×5): qty 1

## 2016-04-28 MED ORDER — WARFARIN - PHARMACIST DOSING INPATIENT: Freq: Every day | Status: DC

## 2016-04-28 MED ORDER — WARFARIN SODIUM 5 MG PO TABS
5.0000 mg | ORAL_TABLET | Freq: Once | ORAL | Status: AC
  Administered 2016-04-28: 5 mg via ORAL
  Filled 2016-04-28: qty 1

## 2016-04-28 NOTE — Progress Notes (Signed)
ANTICOAGULATION CONSULT NOTE - Follow Up Consult  Pharmacy Consult for Coumadin  Indication: Venous sinus thrombosis in setting of ICH  Allergies  Allergen Reactions  . Aspirin Itching  . Buprenorphine Hcl Itching  . Codeine Other (See Comments)    Makes her feel like something is crawling under her skin. Can take, sometimes makes her itch  . Compazine Itching    "makes my skin crawl"  . Ioxaglate Itching  . Ivp Dye [Iodinated Diagnostic Agents] Itching    Makes her itch really bad. Had to get 2-3 shots of Benadryl when she was in the hospital.  . Metrizamide Itching  . Morphine And Related Itching  . Naproxen Itching  . Norco [Hydrocodone-Acetaminophen] Itching  . Penicillins Itching and Other (See Comments)    Makes her feel really uncomfortable. No other information available.  . Prochlorperazine Maleate Other (See Comments)    Compazine makes her skin crawl - needs 2-3 shots of Benadryl to get relief.  . Reglan [Metoclopramide] Itching and Other (See Comments)    Makes her skin crawl  . Tegretol [Carbamazepine] Itching and Other (See Comments)    Makes her feel like something is crawling under her skin.  . Toradol [Ketorolac Tromethamine] Itching  . Tramadol Itching    Patient Measurements: Height: 5\' 3"  (160 cm) Weight: 204 lb 12.8 oz (92.9 kg) IBW/kg (Calculated) : 52.4  Vital Signs: Temp: 98.1 F (36.7 C) (08/20 1618) Temp Source: Oral (08/20 1618) BP: 141/98 (08/20 1700) Pulse Rate: 79 (08/20 1700)  Labs:  Recent Labs  04/26/16 0527 04/26/16 1210 04/27/16 0246 04/28/16 0236 04/28/16 0429 04/28/16 1840  HGB 14.7  --  14.5 14.9  --   --   HCT 45.1  --  43.8 45.5  --   --   PLT 232  --  227 236  --   --   LABPROT  --   --   --   --   --  13.3  INR  --   --   --   --   --  1.01  HEPARINUNFRC 0.41 0.34 0.34  --  0.39  --   CREATININE  --   --  0.61 0.55  --   --     Estimated Creatinine Clearance: 90.1 mL/min (by C-G formula based on SCr of 0.8  mg/dL).  Assessment: 50 yof continues on IV heparin for venous sinus thrombosis in setting of ICH. Pharmacy consulted to dose warfarin. Patient noted with small SAH.  -INR= 1.01  Goal of Therapy:  Heparin level 0.3-0.5 units/ml Monitor platelets by anticoagulation protocol: Yes   Plan:  -Coumadin 5mg  x1 -Daily PT/INR  Harland GermanAndrew Lawsen Arnott, Pharm D 04/28/2016 7:14 PM

## 2016-04-28 NOTE — Progress Notes (Signed)
STROKE TEAM PROGRESS NOTE   HPI:                                                                                                                                          Stephanie [name] is an 50 y.o. female who presented to [facility] with a complaint of diffuse pain as well as headache. She has a known history of hepatitis C, sciatica, recent history of congestive heart failure, and drug seeking behavior. She presented to the emergency room with a complaint of headache and diffuse pain. A CT of the brain was performed and revealed the presence of a large left parieto-occipital hematoma with significant intraventricular extension. She was transferred to [facility] for more definitive care.   SUBJECTIVE (INTERVAL HISTORY) Patient's friend is at the bedside.  The friend confirmed willingness to take patient to her home upon discharge.  I explained that patient may need more skilled nursing care given transition need from heparin to coumadin.  She complains of headache which is mostly worsened in the afternoon.  She is eager to leave the hospital but agreed to stay at least for the day.  OBJECTIVE Temp:  [97.5 F (36.4 C)-99 F (37.2 C)] 98.3 F (36.8 C) (08/20 0333) Pulse Rate:  [53-103] 87 (08/20 0700) Cardiac Rhythm: Normal sinus rhythm (08/20 0400) Resp:  [10-36] 33 (08/20 0700) BP: (123-183)/(72-131) 149/111 (08/20 0700) SpO2:  [94 %-100 %] 100 % (08/20 0700)  CBC:   Recent Labs Lab 04/27/16 0246 04/28/16 0236  WBC 10.1 8.3  HGB 14.5 14.9  HCT 43.8 45.5  MCV 95.2 95.6  PLT 227 236    Basic Metabolic Panel:   Recent Labs Lab 04/27/16 0246 04/28/16 0236  NA 137 140  K 3.6 3.2*  CL 101 102  CO2 26 25  GLUCOSE 157* 114*  BUN 8 6  CREATININE 0.61 0.55  CALCIUM 9.6 9.9    Lipid Panel:     Component Value Date/Time   CHOL 118 04/22/2016 0357   TRIG 91 04/22/2016 0357   HDL 40 (L) 04/22/2016 0357   CHOLHDL 3.0 04/22/2016 0357   VLDL 18 04/22/2016 0357    LDLCALC 60 04/22/2016 0357    IMAGING I have personally reviewed the radiological images below and agree with the radiology interpretations.  Ct Head Wo Contrast 04/24/2016 Left posterior temporal hemorrhage unchanged. There is possible thrombosis in the left transverse sinus as the cause of the hemorrhage. CT venogram recommended for confirmation.  04/18/2016 No significant change since the prior study with 2.8 x 4.1 cm left parietal intraparenchymal hemorrhage with intraventricular extension and 2 mm left to right midline shift with some effacement of the left basilar cisterns.   04/18/2016 Large intraparenchymal hemorrhage is noted in left posterior parietal cortex with extension into the left lateral and third ventricles. Small amount of left-sided subarachnoid hemorrhage is noted. 4 mm of  left-to-right midline shift is noted.   MRI HEAD  04/20/2016 1. Similar size and appearance of acute right temporal occipital intraparenchymal hematoma measuring 4.0 x 2.8 x 2.7 cm (estimated volume 15 cc). Similar localized edema with trace 3 mm left-to-right shift.  2. Intraventricular extension with blood in the lateral and third ventricles. No evidence for hydrocephalus or ventricular trapping.  3. Small volume acute subarachnoid hemorrhage within the posterior cerebral hemispheres bilaterally, stable from prior CT.  4. Trace left subdural hemorrhage measuring up to 2 mm, difficult to visualize on prior study, but felt to be unchanged.   MRA HEAD  04/20/2016 1. Negative intracranial MRA. No vascular abnormality identified underlying the acute hemorrhage.  2. No large or proximal arterial branch occlusion. No high-grade or correctable stenosis.  3. Multi focal atheromatous irregularity involving the anterior posterior circulations as above, predominantly involving the distal small vessels.   MRV Head 04/24/2016 Filling defect at the junction of the LEFT transverse and sigmoid sinus consistent with  nonocclusive LEFT dural venous sinus thrombosis.  2D Echocardiogram  - Procedure narrative: Transthoracic echocardiography. Image quality was poor. The study was technically difficult, as a result of poor sound wave transmission.  - Left ventricle: The cavity size was normal. Wall thickness was increased in a pattern of mild LVH. There was mild focal basal hypertrophy of the septum. Systolic function was vigorous. The estimated ejection fraction was in the range of 65% to 70%. Wall motion was normal; there were no regional wall motion abnormalities. Doppler parameters are consistent with abnormal left ventricular relaxation (grade 1 diastolic dysfunction). Impressions:   Vigorous LV systolic function; grade 1 diastolic dysfunction.  EEG 04/24/16 1) left temporal sharp wave discharges 2) generalized irregular slow activity Clinical Interpretation: This EEG indicates a potential seizure focus in the left temporal region. No seizure was recorded on this study.  EEG 04/26/16 This awake and asleep EEG is abnormal due to the presence of: 1. Moderate diffuse slowing of the waking background 2. Frontal intermittent rhythmic delta activity (FIRDA) Clinical Correlation of the above findings indicates diffuse cerebral dysfunction that is non-specific in etiology and can be seen with hypoxic/ischemic injury, toxic/metabolic encephalopathies, or medication effect. FIRDA can be seen with neuronal dysfunction from metabolic encephalopathy, ischemic injury, increased intracranial pressure, or from deep diencephalic or brainstem lesions. The absence of epileptiform discharges does not rule out a clinical diagnosis of epilepsy.  Clinical correlation is advised.   PHYSICAL EXAM Obese middle aged caucasian lady not in distress. Afebrile. Head is nontraumatic. Neck is supple without bruit.  Cardiac exam no murmur or gallop.  Lungs are clear to auscultation.  Abdomen:  Good bowel sounds Distal pulses are well  felt.  Neurological Exam :  Awake and alert.  Standing with RN at bedside upon my arrival;   Orientated to self, month/year and "Stephanie [name]"  Able to name the current president today Fluent speech Follows simple command but seems distracted and confused by complex multistep commands.   Pupils equal reactive. Visual fields appear grossly intact.  Facial symmetric. Shrug greater on left than right. SCMs intact bilaterally.   Motor symmetric bilaterally 4/5, but with significant bilateral asterixis.   Sensation symmetric to light touch  Gait observed to be slow and wide based   ASSESSMENT/PLAN [name] is a 50 y.o. female with history of hepatitis C,sciatica, CHF, and drug seeking behavior presenting to [place] with complaints of diffuse pain and HA. CT showed a moderate L parieto-occipital hemorrhage with IVH.  She was transferred to [facility] for admission  ICH:  moderate left parieto-occipital hemorrhage with resultant IVH, SAH, secondary to L transverse sinus thrombosis  Resultant - confused with HA  Neurosurgery consult - no surgery or IVC, manage BP - signed off 04/22/2016  CT at [place] moderate L posterior parietal cortex hemorrhage with IVH, small SAH L temporal and posterior parietal. 4mm L shift  Repeat CT head no significant change, 2mm L shift with some effacement of L basilar cistern  MRI without contrast - acute left temporal occipital intraparenchymal hematoma, L to R shift (3mm), IVH, small volume SAH, SDH  MRA - negative  Repeat CT head 04/24/2016. Unchanged, no hydrocephalus but concerning for left transverse sinus thrombosis  MRV nonocclusive L transverse sinus thrombosis  LDL 60  HgbA1c 8.1  2D Echo - EF 65-70%. No cardiac source of emboli identified.  heparin IV for VTE prophylaxis  Diet heart healthy/carb modified Room service appropriate? Yes; Fluid consistency: Thin   BC Powders prior to admission  Start IV heparin per pharmacy, no  bolus, level 0.3-0.5  Transfer pt to stepdown ICU for increased frequency VS and neuro checks given heparin in setting of ICH  Ongoing aggressive stroke risk factor management  Therapy recommendations:  SNF. Patient refused SNF.  Disposition:  pending   Headache  Repeat CT head Unchanged, no hydrocephalus  On Fioricet when necessary.   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   Abnormal EEG  EEG positive for potential area of epileptogenicity  On Keppra bid  Repeat EEG no seizure like activity - FIRDA and diffuse slowing  Hypertensive Emergency  BP 203/116 on arrival to [place] in setting of neurologic symptoms  Off cardene drip.   On norvasc 10 qd, losartan 50 bid and labetalol 100mg  tid  Stable - BP 149/111 on 04/28/2016  Long-term BP goal normotensive  Diabetes  HgbA1c 8.1, goal < 7.0  Uncontrolled  CBG monitoring  On SSI  On glucotrol, increased to bid dosing  Changed diet to DM diet  Appreciate DM RN recs  Tobacco abuse  Current smoker  Smoking cessation counseling provided  Pt is willing to quit  Other Stroke Risk Factors  Obesity, Body mass index is 36.28 kg/m.  recommend weight loss, diet and exercise as appropriate   Congestive Heart Failure  Hypokalemia 3.4 -> 3.6 -> 3.2 -> supplement and recheck level Tuesday.  Hospital day # 10  ATTENDING NOTE: Patient was seen and examined by me personally. Documentation reflects findings. The laboratory and radiographic studies reviewed by me. ROS completed by me personally and pertinent positives fully documented  Condition: somewhat improved  Assessment and plan completed by me personally and fully documented above. Plans/Recommendations include:     Continue heparin;   Continue in monitored setting  Hypokalemia improved; continue to monitor  During the week the question of NOAC therapy was raised.   Discussed NOAC management for cerebral venous thrombosis with pharmacy yesterday and no FDA approval.  Plan Coumadin transition.  First dose tonight.  Pharm to dose.  CT ordered as baseline review before starting Coumadin  Ammonia level sent -> 20 ( 9-35 )  SIGNED BY: Dr. Sula Soda       To contact Stroke Continuity provider, please refer to [email or web address]. After hours, contact General Neurology

## 2016-04-28 NOTE — Progress Notes (Signed)
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 Aspirin Itching  . Buprenorphine Hcl Itching  . Codeine Other (See Comments)    Makes her feel like something is crawling under her skin. Can take, sometimes makes her itch  . Compazine Itching    "makes my skin crawl"  . Ioxaglate Itching  . Ivp Dye [Iodinated Diagnostic Agents] Itching    Makes her itch really bad. Had to get 2-3 shots of Benadryl when she was in the hospital.  . Metrizamide Itching  . Morphine And Related Itching  . Naproxen Itching  . Norco [Hydrocodone-Acetaminophen] Itching  . Penicillins Itching and Other (See Comments)    Makes her feel really uncomfortable. No other information available.  . Prochlorperazine Maleate Other (See Comments)    Compazine makes her skin crawl - needs 2-3 shots of Benadryl to get relief.  . Reglan [Metoclopramide] Itching and Other (See Comments)    Makes her skin crawl  . Tegretol [Carbamazepine] Itching and Other (See Comments)    Makes her feel like something is crawling under her skin.  . Toradol [Ketorolac Tromethamine] Itching  . Tramadol Itching    Patient Measurements: Height: 5\' 3"  (160 cm) Weight: 204 lb 12.8 oz (92.9 kg) IBW/kg (Calculated) : 52.4  Vital Signs: Temp: 98.4 F (36.9 C) (08/20 0817) Temp Source: Oral (08/20 0817) BP: 149/111 (08/20 0700) Pulse Rate: 87 (08/20 0700)  Labs:  Recent Labs  04/26/16 0527 04/26/16 1210 04/27/16 0246 04/28/16 0236 04/28/16 0429  HGB 14.7  --  14.5 14.9  --   HCT 45.1  --  43.8 45.5  --   PLT 232  --  227 236  --   HEPARINUNFRC 0.41 0.34 0.34  --  0.39  CREATININE  --   --  0.61 0.55  --     Estimated Creatinine Clearance: 90.1 mL/min (by C-G formula based on SCr of 0.8 mg/dL).  Assessment: 50 yof continues on IV heparin for venous sinus thrombosis in setting of ICH. CBC is WNL and no  new bleeding noted. Heparin level remains therapeutic.  Goal of Therapy:  Heparin level 0.3-0.5 units/ml Monitor platelets by anticoagulation protocol: Yes   Plan:  Continue heparin at 1150 units/hr Daily HL and CBC  Lysle Pearlachel Alette Kataoka, PharmD, BCPS Pager # [phone] 04/28/2016 9:32 AM

## 2016-04-29 ENCOUNTER — Encounter (HOSPITAL_COMMUNITY): Payer: Self-pay | Admitting: Radiology

## 2016-04-29 ENCOUNTER — Inpatient Hospital Stay (HOSPITAL_COMMUNITY): Payer: 59

## 2016-04-29 DIAGNOSIS — G936 Cerebral edema: Secondary | ICD-10-CM

## 2016-04-29 LAB — GLUCOSE, CAPILLARY
GLUCOSE-CAPILLARY: 159 mg/dL — AB (ref 65–99)
GLUCOSE-CAPILLARY: 147 mg/dL — AB (ref 65–99)
GLUCOSE-CAPILLARY: 124 mg/dL — AB (ref 65–99)
Glucose-Capillary: 125 mg/dL — ABNORMAL HIGH (ref 65–99)
Glucose-Capillary: 167 mg/dL — ABNORMAL HIGH (ref 65–99)

## 2016-04-29 LAB — PROTIME-INR
INR: 1.01
PROTHROMBIN TIME: 13.3 s (ref 11.4–15.2)

## 2016-04-29 LAB — HEPARIN LEVEL (UNFRACTIONATED): Heparin Unfractionated: 0.42 IU/mL (ref 0.30–0.70)

## 2016-04-29 IMAGING — US US RENAL KIDNEY
1 series · 14 of 25 positions shown · non-contrast
Comparison: CT abdomen pelvis 02/27/2014.

CLINICAL DATA: Left flank pain.

EXAM:
RENAL/URINARY TRACT ULTRASOUND COMPLETE

[Series 1: us renal kidney · 0.20mm/px · 41 acquisitions, 14 frames shown]
[im 1/41]
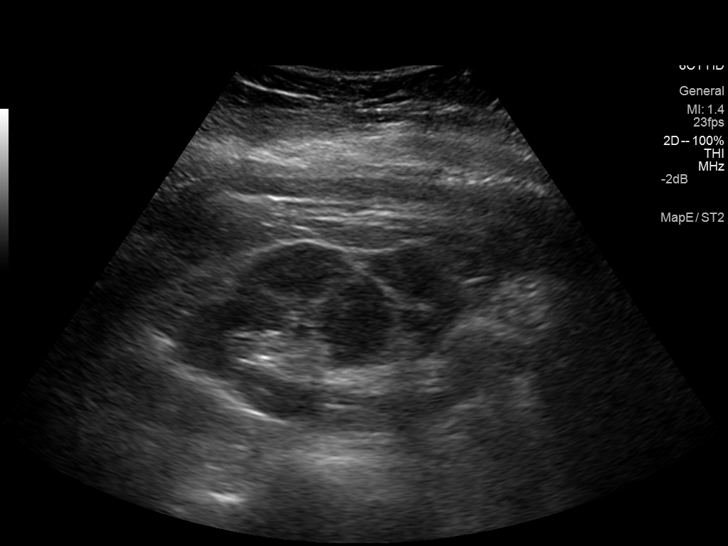
[im 4/41]
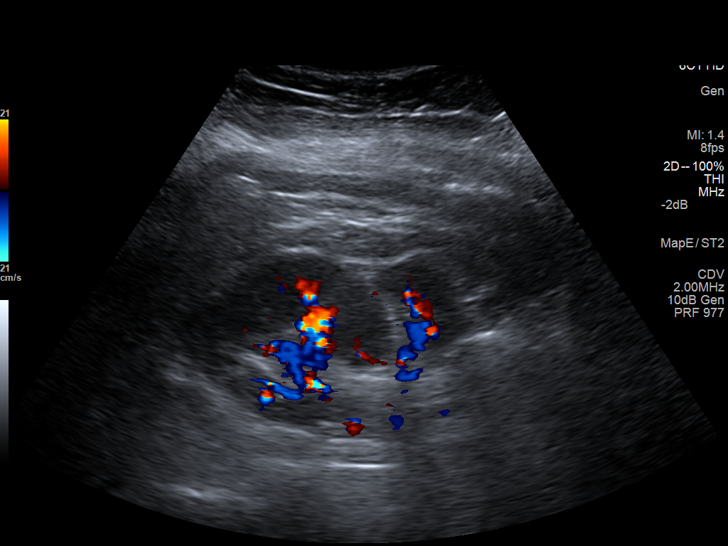
[im 7/41]
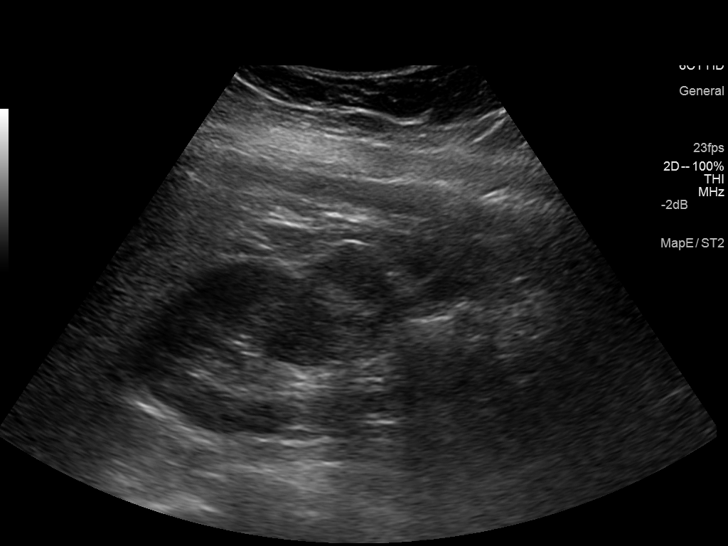
[im 11/41]
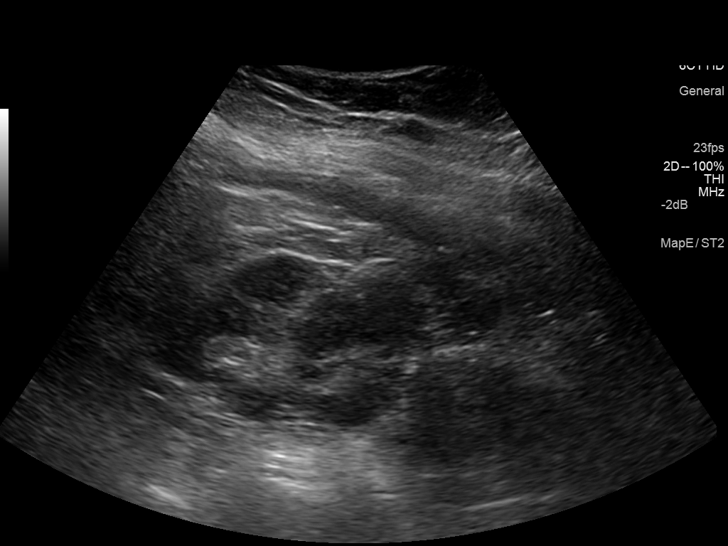
[im 14/41]
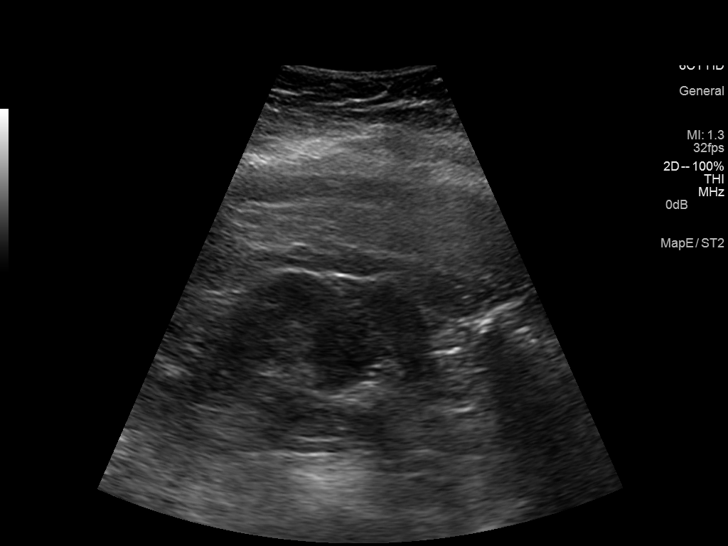
[im 16/41]
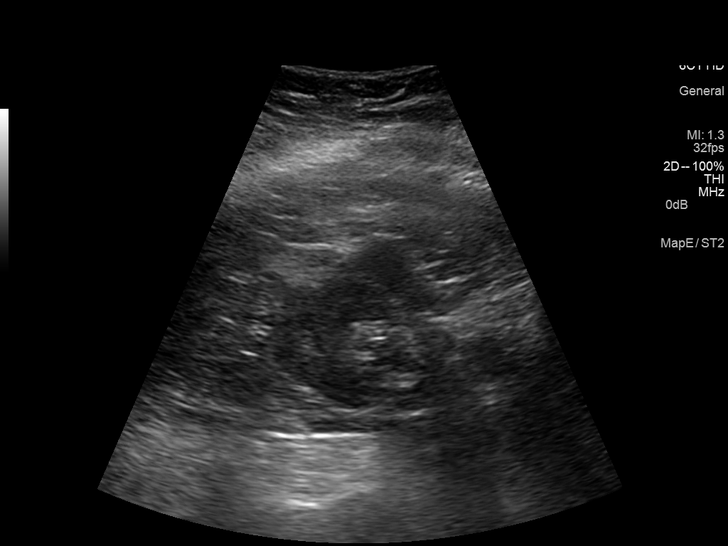
[im 19/41]
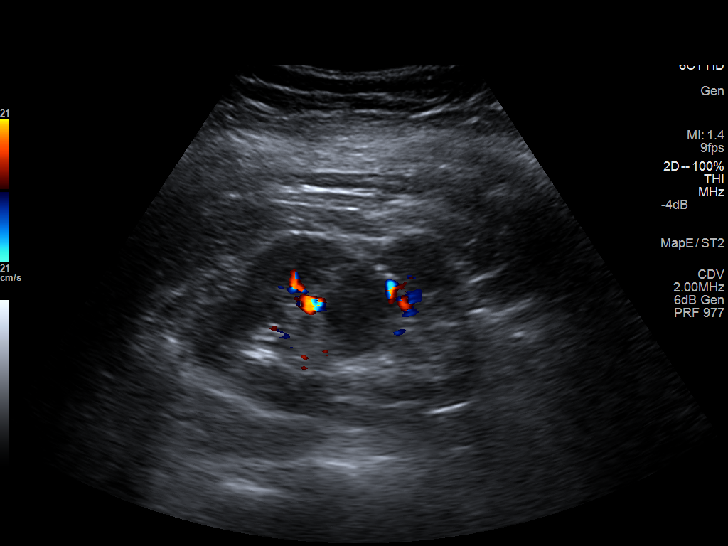
[im 22/41]
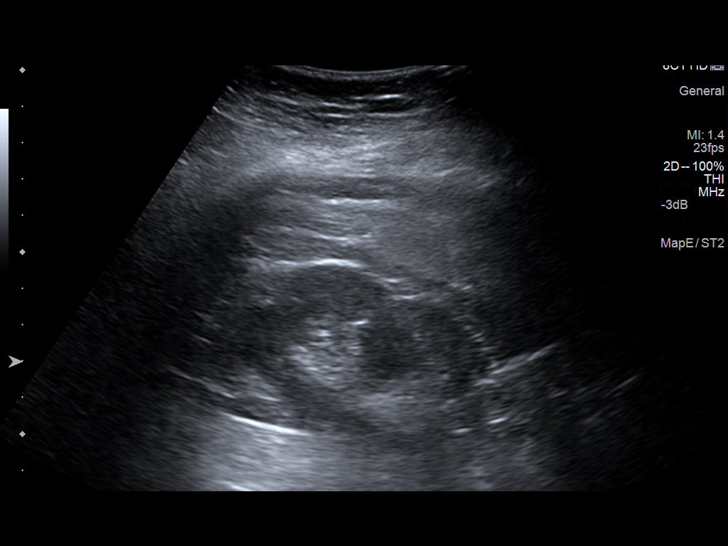
[im 26/41]
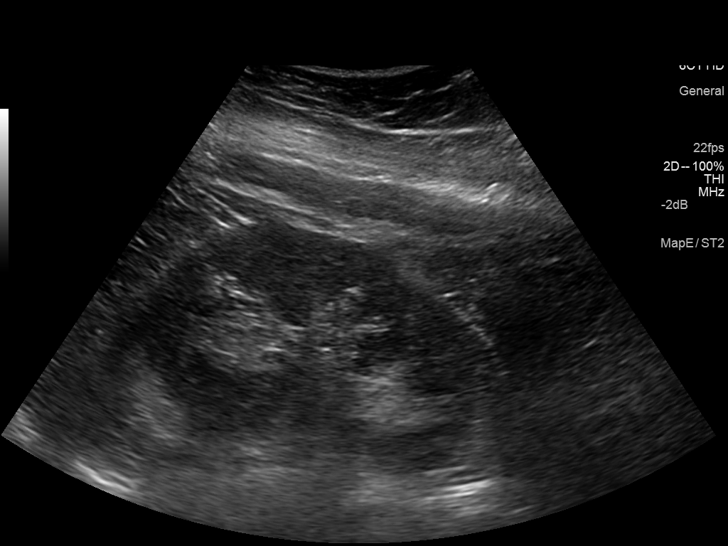
[im 27/41]
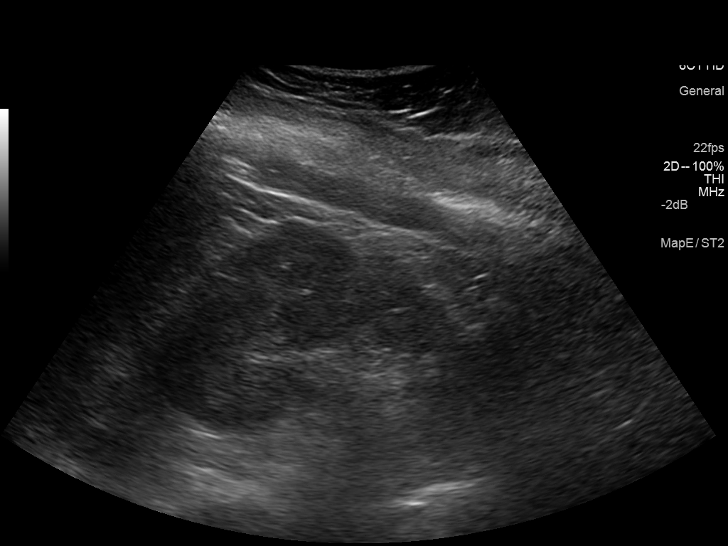
[im 31/41]
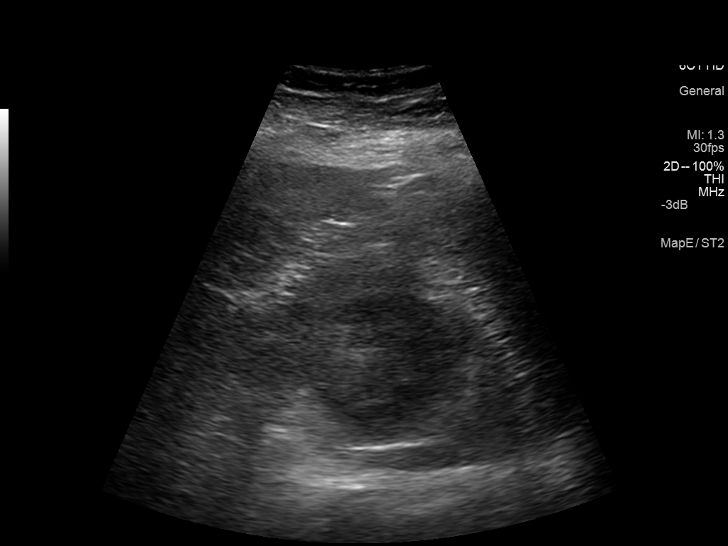
[im 34/41]
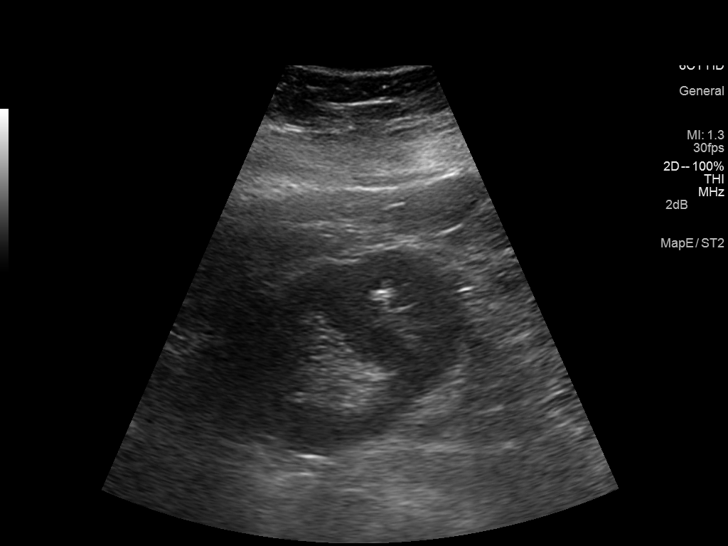
[im 37/41]
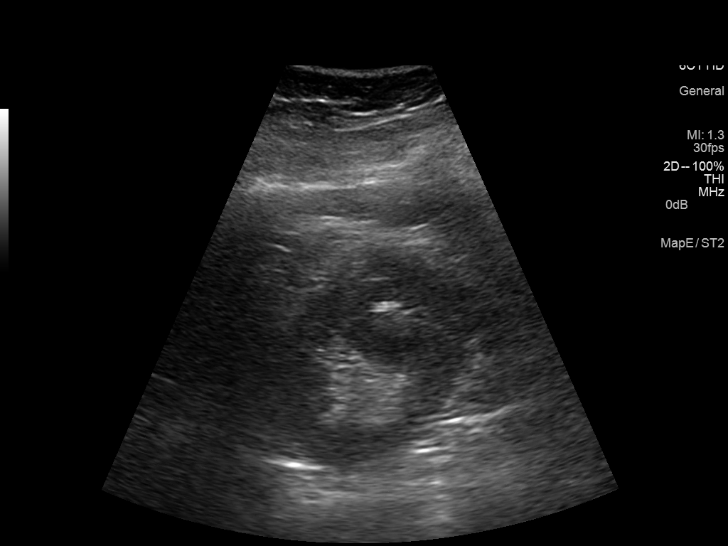
[im 41/41]
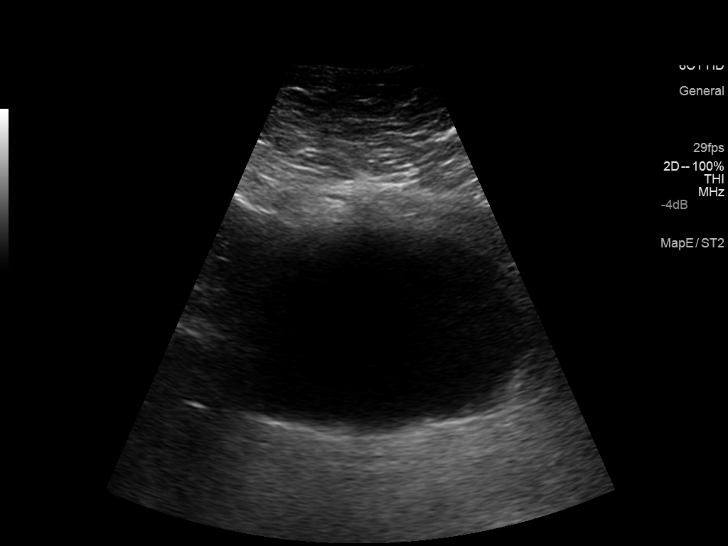

[14 of 25 positions shown; findings below may reference images not displayed]

FINDINGS: Right Kidney:

Length: 9.2 cm. Parenchymal echogenicity is within normal limits.
Margin is lobular and there are areas of scarring. A hypoechoic area
in the central portion of the kidney, measured on image 25, has no
CT correlate on 02/27/2014. No hydronephrosis. Contains scattered
echogenic calcifications which are likely parenchymal when compared
with 02/27/2014.

Left Kidney:

Length: 10.4 cm. Parenchymal echogenicity is within normal limits.
Contour is lobular. Echogenic foci measure up to 9 mm and appear to
be parenchymal as well.

Bladder:

Appears normal for degree of bladder distention.
IMPRESSION: No acute findings. Echogenic foci (calcifications) in the kidneys
bilaterally may be parenchymal when compared to 02/27/2014. No
hydronephrosis.

## 2016-04-29 MED ORDER — IOPAMIDOL (ISOVUE-300) INJECTION 61%
INTRAVENOUS | Status: AC
  Administered 2016-04-29: 16:00:00
  Filled 2016-04-29: qty 75

## 2016-04-29 MED ORDER — IOPAMIDOL (ISOVUE-300) INJECTION 61%
INTRAVENOUS | Status: AC
  Administered 2016-04-29: 75 mL
  Filled 2016-04-29: qty 75

## 2016-04-29 MED ORDER — OLANZAPINE 5 MG PO TBDP
5.0000 mg | ORAL_TABLET | Freq: Every day | ORAL | Status: DC
  Filled 2016-04-29: qty 1

## 2016-04-29 MED ORDER — LORAZEPAM 2 MG/ML IJ SOLN
2.0000 mg | INTRAMUSCULAR | Status: DC | PRN
  Filled 2016-04-29: qty 1

## 2016-04-29 MED ORDER — IOPAMIDOL (ISOVUE-300) INJECTION 61%
INTRAVENOUS | Status: AC
  Filled 2016-04-29: qty 75

## 2016-04-29 MED ORDER — WARFARIN SODIUM 7.5 MG PO TABS
7.5000 mg | ORAL_TABLET | Freq: Once | ORAL | Status: AC
  Administered 2016-04-29: 7.5 mg via ORAL
  Filled 2016-04-29: qty 1

## 2016-04-29 MED ORDER — DIPHENHYDRAMINE HCL 50 MG/ML IJ SOLN
25.0000 mg | Freq: Once | INTRAMUSCULAR | Status: AC
  Administered 2016-04-29: 25 mg via INTRAVENOUS
  Filled 2016-04-29: qty 1

## 2016-04-29 NOTE — Progress Notes (Signed)
STROKE TEAM PROGRESS NOTE   SUBJECTIVE (INTERVAL HISTORY) Patient upset - she wants to go home. [place] Spaniel she was told she could go home Sunday or Monday. Her partner is at the bedside. Patient agreed to stay one more day if she was allowed to step outside to smoke. No new complaints otherwise   OBJECTIVE Temp:  [97.6 F (36.4 C)-98.3 F (36.8 C)] 98 F (36.7 C) (08/21 0828) Pulse Rate:  [52-105] 87 (08/21 0938) Cardiac Rhythm: Normal sinus rhythm (08/21 0400) Resp:  [13-34] 23 (08/21 0600) BP: (99-152)/(73-101) 147/95 (08/21 0938) SpO2:  [80 %-100 %] 95 % (08/21 0815)  CBC:   Recent Labs Lab 04/27/16 0246 04/28/16 0236  WBC 10.1 8.3  HGB 14.5 14.9  HCT 43.8 45.5  MCV 95.2 95.6  PLT 227 236    Basic Metabolic Panel:   Recent Labs Lab 04/27/16 0246 04/28/16 0236  NA 137 140  K 3.6 3.2*  CL 101 102  CO2 26 25  GLUCOSE 157* 114*  BUN 8 6  CREATININE 0.61 0.55  CALCIUM 9.6 9.9    IMAGING Ct Head Wo Contrast  Result Date: 04/28/2016 CLINICAL DATA:  50 year old female with posterior left hemisphere intra-axial hemorrhage suspected secondary to left venous/dural sinus thrombus. Initial encounter. EXAM: CT HEAD WITHOUT CONTRAST TECHNIQUE: Contiguous axial images were obtained from the base of the skull through the vertex without intravenous contrast. COMPARISON:  Intracranial MRV 04/24/2016 and earlier. FINDINGS: Brain: The posterior left hemisphere intra-axial hemorrhage appears less distinct and is stable with size now 40 x 27 x 30 mm (AP by transverse by CC) versus 41 x 27 x 28 mm previously. Surrounding edema has slightly regressed. Stable mild regional mass effect. Associated small volume intraventricular hemorrhage in the left lateral ventricle is no longer evident. No ventriculomegaly. No other extra-axial extension. No midline shift. Basilar cisterns remain patent. Elsewhere stable and normal gray-white matter differentiation. Vascular: Stable CT appearance of the major  intracranial vascular structures. Skull: Stable visualized osseous structures. Sinuses/Orbits: Visualized paranasal sinuses and mastoids are stable and well pneumatized. Other: No acute orbit or scalp soft tissue findings. IMPRESSION: 1. Expected evolution of the posterior left hemisphere intra-axial hemorrhage which is stable in size and slightly less distinct with less surrounding edema. Stable mild regional mass effect with no midline shift. 2. Small volume intraventricular hemorrhage appears resolved. No ventriculomegaly. 3. No new intracranial abnormality. Electronically Signed   By: Odessa Fleming M.D.   On: 04/28/2016 17:44     PHYSICAL EXAM Obese middle aged caucasian lady not in distress. Afebrile. Head is nontraumatic. Neck is supple without bruit.  Cardiac exam no murmur or gallop.  Lungs are clear to auscultation.  Abdomen:  Good bowel sounds Distal pulses are well felt.  Neurological Exam :  Awake and alert.  Sitting up in bed Fluent speech Follows simple command but seems distracted and confused by complex multistep commands.   Pupils equal reactive. R field defect.  Facial symmetric. SCMs intact bilaterally.   Motor symmetric bilaterally 4/5.  Sensation symmetric to light touch  Gait observed to be slow and wide based   ASSESSMENT/PLAN Ms. CLORINDA WYBLE is a 50 y.o. female with history of hepatitis C,sciatica, CHF, and drug seeking behavior presenting to [place] with complaints of diffuse pain and HA. CT showed a moderate L parieto-occipital hemorrhage with IVH. She was transferred to [facility] for admission  ICH:  moderate left parieto-occipital hemorrhage with resultant IVH, SAH, secondary to L transverse sinus thrombosis  Resultant -  confused with HA  Neurosurgery consult - no surgery or IVC, manage BP - signed off 04/22/2016  CT at [facility] moderate L posterior parietal cortex hemorrhage with IVH, small SAH L temporal and posterior parietal. 4mm L shift  Repeat CT head  no significant change, 2mm L shift with some effacement of L basilar cistern  MRI without contrast - acute left temporal occipital intraparenchymal hematoma, L to R shift (3mm), IVH, small volume SAH, SDH  MRA - negative  Repeat CT head 04/24/2016. Unchanged, no hydrocephalus but concerning for left transverse sinus thrombosis  MRV nonocclusive L transverse sinus thrombosis  Repeat CT head 04/28/2016 Expected evolution of the posterior left hemisphere intra-axial hemorrhage which is stable in size and slightly less distinct with less surrounding edema. Stable mild regional mass effect with no midline shift.  LDL 60  HgbA1c 8.1  2D Echo - EF 65-70%. No cardiac source of emboli identified.  heparin IV for VTE prophylaxis  Diet heart healthy/carb modified Room service appropriate? Yes; Fluid consistency: Thin   BC Powders prior to admission  Now on IV heparin per pharmacy.  During the week the question of NOAC therapy was raised.  Discussed NOAC management for cerebral venous thrombosis with pharmacy yesterday and no FDA approval.  Plan Coumadin transition.  First dose 8/20.  Pharm to dose.  Ongoing aggressive stroke risk factor management  Therapy recommendations:  SNF recommended but patient refused. Wants to go home.   Disposition:  pending   Headache  Repeat CT head Unchanged, no hydrocephalus  On Fioricet when necessary.   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  Abnormal EEG  EEG positive for potential area of epileptogenicity  On Keppra bid  Repeat EEG no seizure like activity - FIRDA and diffuse slowing  Hypertensive Emergency  BP 203/116 on arrival to [place] in setting of neurologic symptoms  Off cardene drip.   On norvasc 10 qd, losartan 50 bid and labetalol 100mg  tid  Stable  Long-term BP goal normotensive  Diabetes  HgbA1c 8.1, goal < 7.0  Uncontrolled  CBG  monitoring  On SSI  On glucotrol, increased to bid dosing  Changed diet to DM diet  Appreciate DM RN recs  Tobacco abuse  Current smoker  Smoking cessation counseling provided  Pt is willing to quit  Other Stroke Risk Factors  Obesity, Body mass index is 36.28 kg/m.  recommend weight loss, diet and exercise as appropriate   Congestive Heart Failure  Hypokalemia 3.4 -> 3.6 -> 3.2 -> supplement and recheck level Tuesday.  Ammonia level sent -> 20 ( 9-35 )  Hospital day # 11  Discussed with Dr. Bernerd LimboSethi  Jennifer Krall, MD  Internal Medicine PGY-3 I have personally examined this patient, reviewed notes, independently viewed imaging studies, participated in medical decision making and plan of care. I have made any additions or clarifications directly to the above note. Agree with note above.  100 mg long-standing history of difficult to control hypertension with hypertensive urgency and left temporoparietal parenchymal hemorrhage with intraventricular extension was initially treated as hypertensive bleed but MRI venogram suggests filling defect in the left transverse sinus possibly a vein of Labbe thrombosis. There is good flow proximal to and beyond this. Possibility of arachnoid granulation causing the filling defect or local mass effect from hematoma causing filling defect is not entirely ruled out. Patient had stated that she was allergic to IV dye and hence further  studies had not been done. I feel it is imperative to find out whether this is actual transverse sinus thrombosis on not before committing this patient to anticoagulation as she is at high risk for bleeding given her uncontrolled hypertension and possibly high risk for noncompliance given her belligerent behavior today of wanting to leave. Discussed with neuroradiologist Dr. Augusto GambleLee Hall. Recommend premedication with IV Benadryl followed by CT venogram today. Discussed with patient who is in agreement. This patient is  critically ill and at significant risk of neurological worsening, death and care requires constant monitoring of vital signs, hemodynamics,respiratory and cardiac monitoring, extensive review of multiple databases, frequent neurological assessment, discussion with family, other specialists and medical decision making of high complexity.I have made any additions or clarifications directly to the above note.This critical care time does not reflect procedure time, or teaching time or supervisory time of PA/NP/Med Resident etc but could involve care discussion time.  I spent 50 minutes of neurocritical care time  in the care of  this patient.      Delia HeadyPramod Meiko Ives, MD Medical Director [facility] Pager: [phone] 04/29/2016 1:43 PM   To contact Stroke Continuity provider, please refer to [email or web address]. After hours, contact General Neurology

## 2016-04-29 NOTE — Progress Notes (Signed)
Physical Therapy Treatment Patient Details Name: Stephanie GuerinDianna B [name] MRN: [id number] DOB: 1966/03/16 Today's Date: 04/29/2016    History of Present Illness Stephanie B Phillipsis an 50 y.o.femaleadmitted with Left parieto-occipital hematoma. PMHx: hepatitis C,sciatica, recent history of congestive heart failure, and drug seeking behavior. Pt with right carpal tunnel release and De Quervain release on 04/15/16.  Has splint on right UE.      PT Comments    Pt agitated this morning very determined to return home today and upset about being confined in hospital room. Pt with improved mobility but cognition and higher level balance all remain deficits for pt and highly impact her safety for home. Pt is not aware of her deficits and does not believe education for correction or deficits when provided. Will continue to follow.    Follow Up Recommendations  Supervision/Assistance - 24 hour;SNF;Home health PT (SNF preferred as RN states family is not able to provide consistent and direct 24hr supervision which pt requires for safety, balance and cognition. Should pt return home HHPT with 24hr supervision for balance recommended)     Equipment Recommendations  None recommended by PT    Recommendations for Other Services       Precautions / Restrictions Precautions Precautions: Fall    Mobility  Bed Mobility Overal bed mobility: Needs Assistance Bed Mobility: Supine to Sit;Sit to Supine     Supine to sit: Supervision Sit to supine: Supervision   General bed mobility comments: supervision for safety, lines, pt impulsive  Transfers Overall transfer level: Needs assistance   Transfers: Sit to/from Stand Sit to Stand: Supervision         General transfer comment: supervision for safety and lines  Ambulation/Gait Ambulation/Gait assistance: Supervision Ambulation Distance (Feet): 180 Feet Assistive device: None Gait Pattern/deviations: Step-through pattern   Gait velocity  interpretation: at or above normal speed for age/gender General Gait Details: pt able to avoid obstacles with gait today but continued to need supervision for lines and safety   Stairs            Wheelchair Mobility    Modified Rankin (Stroke Patients Only) Modified Rankin (Stroke Patients Only) Pre-Morbid Rankin Score: No significant disability Modified Rankin: Moderately severe disability     Balance Overall balance assessment: Needs assistance   Sitting balance-Leahy Scale: Good       Standing balance-Leahy Scale: Good                      Cognition Arousal/Alertness: Awake/alert Behavior During Therapy: Impulsive Overall Cognitive Status: Impaired/Different from baseline Area of Impairment: Attention;Safety/judgement;Following commands;Awareness;Problem solving   Current Attention Level: Sustained Memory: Decreased short-term memory Following Commands: Follows one step commands consistently;Follows multi-step commands inconsistently Safety/Judgement: Decreased awareness of safety;Decreased awareness of deficits   Problem Solving: Requires verbal cues General Comments: pt with constant fidgeting with lines, decreased awareness of deficits stating she can do everything fine but with glasses on unable to read sign making up words and letters, able to read clock at this time. Pt very determined to leave today despite education for difficulty in an emergency.     Exercises      General Comments        Pertinent Vitals/Pain Pain Assessment: No/denies pain    Home Living                      Prior Function            PT Goals (current goals  can now be found in the care plan section) Progress towards PT goals: Progressing toward goals    Frequency  Min 3X/week    PT Plan Discharge plan needs to be updated    Co-evaluation             End of Session Equipment Utilized During Treatment: Gait belt Activity Tolerance: Patient  tolerated treatment well Patient left: in bed;with call bell/phone within reach;with bed alarm set (per RN request for safety)     Time: [id number] PT Time Calculation (min) (ACUTE ONLY): 17 min  Charges:  $Physical Performance Test: 8-22 mins                    G Codes:      Delorse Lekabor, Terriyah Westra Beth 04/29/2016, 8:23 AM Delaney MeigsMaija Tabor Xzavion Doswell, PT [phone]

## 2016-04-29 NOTE — Progress Notes (Signed)
Pt becoming progressively more confused and forgetful this shift.  Pt has been repetitively removing monitoring equipment, removing IV, getting out of bed, becoming agitated and combative, stating "she will hit herself in the head."  Attempted mittens, progressed to safety sitter, and soft wrist restraints per Dr. Roxy Mannsster.  Pt refusing ativan, and zyprexa.  Pt's caregiver Lanora Manislizabeth made aware of patient changes. Bedside handoff communication to Izell Carolinaheresa Crite, RN, with soft wrist restraints not continued during bedside report.

## 2016-04-29 NOTE — Progress Notes (Signed)
Speech Language Pathology Treatment: Dysphagia;Cognitive-Linquistic  Patient Details Name: Stephanie [name] MRN: [id number] DOB: June 11, 1966 Today's Date: 04/29/2016 Time: [id number] SLP Time Calculation (min) (ACUTE ONLY): 19 min  Assessment / Plan / Recommendation Clinical Impression  Pt was coopertive for dysphagia and cognitive tx. Given full supervision and min cueing, pt consumed all consistencies with no visible difficulty. Pt was able to follow 1-step commands during PO trials with minimal cueing. Pt displayed implusive behaviors upon needing to use the restroom by not being aware of the amount of lines connected to body. During discussion about D/C pt verbally demonstrated anticipatory awareness, but pt needed moderate support when trying to get up to go to the restroom. SLP educated pt on importance of safety and reminders upon discharge and pt was receptive. Recommend pt D/C from dysphagia tx and continue cognitive tx for further education and strategies.    HPI HPI: Stephanie B Phillipsis an 50 y.o.femalewho presented to [facility] with a complaint of diffuse pain as well as headache. She has a known history of hepatitis C,sciatica, recent history of congestive heart failure, and drug seeking behavior. She presented to the emergency room with a complaint of headache and diffuse pain. A CT of the brain was performed and revealed the presence of a large left parieto-occipital hematoma with significant intraventricular extension.      SLP Plan  Goals updated;Continue with current plan of care     Recommendations  Diet recommendations: Regular;Thin liquid Liquids provided via: Straw Medication Administration: Whole meds with liquid Supervision: Patient able to self feed;Intermittent supervision to cue for compensatory strategies Compensations: Minimize environmental distractions;Slow rate;Small sips/bites Postural Changes and/or Swallow Maneuvers: Seated upright 90 degrees;Upright 30-60  min after meal             Oral Care Recommendations: Oral care BID Follow up Recommendations: Inpatient Rehab Plan: Goals updated;Continue with current plan of care     GO              Stephanie [name], Student SLP  Stephanie [name] 04/29/2016, 3:09 PM

## 2016-04-29 NOTE — Progress Notes (Signed)
ANTICOAGULATION CONSULT NOTE - Follow Up Consult  Pharmacy Consult for Warfarin and Heparin  Indication: Venous sinus thrombosis in setting of ICH  Allergies  Allergen Reactions  . Aspirin Itching  . Buprenorphine Hcl Itching  . Codeine Other (See Comments)    Makes her feel like something is crawling under her skin. Can take, sometimes makes her itch  . Compazine Itching    "makes my skin crawl"  . Ioxaglate Itching  . Ivp Dye [Iodinated Diagnostic Agents] Itching    Makes her itch really bad. Had to get 2-3 shots of Benadryl when she was in the hospital.  . Metrizamide Itching  . Morphine And Related Itching  . Naproxen Itching  . Norco [Hydrocodone-Acetaminophen] Itching  . Penicillins Itching and Other (See Comments)    Makes her feel really uncomfortable. No other information available.  . Prochlorperazine Maleate Other (See Comments)    Compazine makes her skin crawl - needs 2-3 shots of Benadryl to get relief.  . Reglan [Metoclopramide] Itching and Other (See Comments)    Makes her skin crawl  . Tegretol [Carbamazepine] Itching and Other (See Comments)    Makes her feel like something is crawling under her skin.  . Toradol [Ketorolac Tromethamine] Itching  . Tramadol Itching    Patient Measurements: Height: 5\' 3"  (160 cm) Weight: 204 lb 12.8 oz (92.9 kg) IBW/kg (Calculated) : 52.4  Vital Signs: Temp: 97.8 F (36.6 C) (08/21 1150) Temp Source: Oral (08/21 1150) BP: 147/95 (08/21 0938) Pulse Rate: 87 (08/21 0938)  Labs:  Recent Labs  04/27/16 0246 04/28/16 0236 04/28/16 0429 04/28/16 1840 04/29/16 0340  HGB 14.5 14.9  --   --   --   HCT 43.8 45.5  --   --   --   PLT 227 236  --   --   --   LABPROT  --   --   --  13.3 13.3  INR  --   --   --  1.01 1.01  HEPARINUNFRC 0.34  --  0.39  --  0.42  CREATININE 0.61 0.55  --   --   --     Estimated Creatinine Clearance: 90.1 mL/min (by C-G formula based on SCr of 0.8 mg/dL).  Assessment: 50 yo female was  initiated on new warfarin therapy yesterday for venous sinus thrombosis in setting of ICH. Heparin IV bridge will continue until INR is therapeutic.   INR today is subtherapeutic at 1.01 as expected because patient just received first dose of warfarin yesterday. HL was therapeutic today at 0.42 units/mL. No CBC today, but hemoglobin and platelets have been stable this admission. No bleeding noted. Repeat CT of head yesterday shows that ICH appears resolved.   Given that INR did not increase since yet, will give warfarin 7.5 mg PO tonight and continue heparin at current rate.   Goal of Therapy:  INR goal: 2-3  Heparin level 0.3-0.5 units/ml Monitor platelets by anticoagulation protocol: Yes   Plan:  Warfarin 7.5 mg PO tonight  Daily INR Continue heparin at 1150 units/hr until INR is therapeutic Daily HL and CBC Monitor for s/sx of bleeding  Carylon PerchesMaggie Forrest Demuro, PharmD Acute Care Pharmacy Resident  Pager: [phone] 04/29/2016

## 2016-04-30 LAB — CBC
HEMATOCRIT: 47.2 % — AB (ref 36.0–46.0)
Hemoglobin: 15.4 g/dL — ABNORMAL HIGH (ref 12.0–15.0)
MCH: 31.5 pg (ref 26.0–34.0)
MCHC: 32.6 g/dL (ref 30.0–36.0)
MCV: 96.5 fL (ref 78.0–100.0)
Platelets: 239 10*3/uL (ref 150–400)
RBC: 4.89 MIL/uL (ref 3.87–5.11)
RDW: 13.2 % (ref 11.5–15.5)
WBC: 9.3 10*3/uL (ref 4.0–10.5)

## 2016-04-30 LAB — PROTIME-INR
INR: 1.73
Prothrombin Time: 20.5 seconds — ABNORMAL HIGH (ref 11.4–15.2)

## 2016-04-30 LAB — BASIC METABOLIC PANEL
ANION GAP: 11 (ref 5–15)
BUN: 11 mg/dL (ref 6–20)
CHLORIDE: 104 mmol/L (ref 101–111)
CO2: 25 mmol/L (ref 22–32)
Calcium: 10.5 mg/dL — ABNORMAL HIGH (ref 8.9–10.3)
Creatinine, Ser: 0.76 mg/dL (ref 0.44–1.00)
Glucose, Bld: 151 mg/dL — ABNORMAL HIGH (ref 65–99)
POTASSIUM: 5.9 mmol/L — AB (ref 3.5–5.1)
SODIUM: 140 mmol/L (ref 135–145)

## 2016-04-30 LAB — GLUCOSE, CAPILLARY: GLUCOSE-CAPILLARY: 150 mg/dL — AB (ref 65–99)

## 2016-04-30 LAB — HEPARIN LEVEL (UNFRACTIONATED): Heparin Unfractionated: 0.45 IU/mL (ref 0.30–0.70)

## 2016-04-30 MED ORDER — BUTALBITAL-APAP-CAFFEINE 50-325-40 MG PO TABS: 1.0000 | ORAL_TABLET | Freq: Four times a day (QID) | ORAL | 0 refills | Status: DC | PRN

## 2016-04-30 MED ORDER — AMLODIPINE BESYLATE 10 MG PO TABS: 10.0000 mg | ORAL_TABLET | Freq: Every day | ORAL | 0 refills | Status: DC

## 2016-04-30 MED ORDER — OLANZAPINE 5 MG PO TBDP: 5.0000 mg | ORAL_TABLET | Freq: Every day | ORAL | 0 refills | Status: DC

## 2016-04-30 MED ORDER — GLIPIZIDE 5 MG PO TABS
5.0000 mg | ORAL_TABLET | Freq: Two times a day (BID) | ORAL | 0 refills | Status: DC
Start: 2016-04-30 — End: 2022-02-20

## 2016-04-30 MED ORDER — LEVETIRACETAM 500 MG PO TABS: 500.0000 mg | ORAL_TABLET | Freq: Two times a day (BID) | ORAL | 0 refills | Status: DC

## 2016-04-30 MED ORDER — AMLODIPINE BESYLATE 5 MG PO TABS: 5.0000 mg | ORAL_TABLET | Freq: Every day | ORAL | 0 refills | Status: DC

## 2016-04-30 MED ORDER — POTASSIUM CHLORIDE CRYS ER 20 MEQ PO TBCR: 20.0000 meq | EXTENDED_RELEASE_TABLET | Freq: Two times a day (BID) | ORAL | Status: DC

## 2016-04-30 MED ORDER — LABETALOL HCL 100 MG PO TABS: 100.0000 mg | ORAL_TABLET | Freq: Three times a day (TID) | ORAL | 0 refills | Status: DC

## 2016-04-30 NOTE — Progress Notes (Signed)
Occupational Therapy Treatment Patient Details Name: Stephanie GuerinDianna B [name] MRN: [id number] DOB: 1966-08-16 Today's Date: 04/30/2016    History of present illness Stephanie B Phillipsis an 50 y.o.femaleadmitted with Left parieto-occipital hematoma. PMHx: hepatitis C,sciatica, recent history of congestive heart failure, and drug seeking behavior. Pt with right carpal tunnel release and De Quervain release on 04/15/16.  Has splint on right UE.     OT comments  Pt with significant improvements in cognition.  She still demonstrates visual and language deficits, but awareness is much improved.  Recommend HHOT  Follow Up Recommendations  Home health OT;Supervision - Intermittent    Equipment Recommendations  None recommended by OT    Recommendations for Other Services      Precautions / Restrictions Precautions Precautions: Fall       Mobility Bed Mobility Overal bed mobility: Independent                Transfers Overall transfer level: Independent                    Balance Overall balance assessment: No apparent balance deficits (not formally assessed)                                 ADL Overall ADL's : Modified independent                                              Vision                 Additional Comments: Pt does not have glasses with her, so unable to assess reading, however, she is able to tell me that she still is having difficulty identifying and reading words.   Pt located all needed items in her environment without difficulty.  She was able to scan hallway to locate items.     Perception     Praxis      Cognition   Behavior During Therapy: WFL for tasks assessed/performed Overall Cognitive Status: Impaired/Different from baseline Area of Impairment: Attention;Awareness;Problem solving   Current Attention Level: Alternating    Following Commands: Follows multi-step commands consistently   Awareness:  Emergent Problem Solving: Requires verbal cues General Comments: Pt with significant improvements, but still requires cues for full awareness of deficits and effects on function     Extremity/Trunk Assessment               Exercises     Shoulder Instructions       General Comments      Pertinent Vitals/ Pain       Pain Assessment: No/denies pain  Home Living                                          Prior Functioning/Environment              Frequency Min 2X/week     Progress Toward Goals  OT Goals(current goals can now be found in the care plan section)  Progress towards OT goals: Progressing toward goals  Acute Rehab OT Goals Patient Stated Goal: to get better OT Goal Formulation: With patient Time For Goal Achievement: 05/06/16  Plan Discharge plan needs to be updated  Co-evaluation                 End of Session     Activity Tolerance Patient tolerated treatment well   Patient Left in bed   Nurse Communication Mobility status        Time: 1215-1226 OT Time Calculation (min): 11 min  Charges: OT General Charges $OT Visit: 1 Procedure OT Treatments $Self Care/Home Management : 8-22 mins  Stephanie [name] M 04/30/2016, 12:45 PM

## 2016-04-30 NOTE — Discharge Summary (Signed)
Stroke Discharge Summary  [name]: Stephanie [name]       MRN: [id number]      DOB: 03/06/1966  Date of Admission: 04/18/2016 Date of Discharge: 04/30/2016  Attending Physician:  Micki Riley, Stroke MD Consulting Physician(s):    Neurosurgery Tressie Stalker, MD) Patient's PCP:  [facility]  DISCHARGE DIAGNOSIS:  Active Problems:   IVH (intraventricular hemorrhage) (HCC)   Essential hypertension, malignant   Cytotoxic brain edema (HCC)   ICH (intracerebral hemorrhage) (HCC)   BMI: Body mass index is 36.28 kg/m.  Past Medical History:  Diagnosis Date  . Diverticulitis   . Hepatitis C   . Hypertension   . Kidney stones   . Sciatica    Past Surgical History:  Procedure Laterality Date  . ABDOMINAL HYSTERECTOMY    . KIDNEY STONE SURGERY Right 02/12/12  . KIDNEY STONE SURGERY Left 07/15/2012  . LITHOTRIPSY    . OOPHORECTOMY    . SINUS EXPLORATION        Medication List    STOP taking these medications   aspirin-acetaminophen-caffeine 250-250-65 MG tablet Commonly known as:  EXCEDRIN MIGRAINE   oxyCODONE 5 MG immediate release tablet Commonly known as:  Oxy IR/ROXICODONE   promethazine 25 MG tablet Commonly known as:  PHENERGAN   zolpidem 10 MG tablet Commonly known as:  AMBIEN     TAKE these medications   alprazolam 2 MG tablet Commonly known as:  XANAX Take 2 mg by mouth daily as needed for anxiety.   amLODipine 5 MG tablet Commonly known as:  NORVASC Take 1 tablet (5 mg total) by mouth daily.   busPIRone 10 MG tablet Commonly known as:  BUSPAR Take 10 mg by mouth 3 (three) times daily.   butalbital-acetaminophen-caffeine 50-325-40 MG tablet Commonly known as:  FIORICET, ESGIC Take 1 tablet by mouth every 6 (six) hours as needed for headache.   dicyclomine 20 MG tablet Commonly known as:  BENTYL Take 20 mg by mouth 2 (two) times daily.   DULoxetine 60 MG capsule Commonly known as:  CYMBALTA Take 60 mg by mouth daily.    glipiZIDE 5 MG tablet Commonly known as:  GLUCOTROL Take 1 tablet (5 mg total) by mouth 2 (two) times daily before a meal. What changed:  when to take this   labetalol 100 MG tablet Commonly known as:  NORMODYNE Take 1 tablet (100 mg total) by mouth 3 (three) times daily.   levETIRAcetam 500 MG tablet Commonly known as:  KEPPRA Take 1 tablet (500 mg total) by mouth 2 (two) times daily.   losartan 50 MG tablet Commonly known as:  COZAAR Take 50 mg by mouth 2 (two) times daily.   metFORMIN 500 MG tablet Commonly known as:  GLUCOPHAGE Take 1,000 mg by mouth 2 (two) times daily with a meal.   OLANZapine zydis 5 MG disintegrating tablet Commonly known as:  ZYPREXA Take 1 tablet (5 mg total) by mouth at bedtime.   omeprazole 20 MG capsule Commonly known as:  PRILOSEC Take 40 mg by mouth daily.       LABORATORY STUDIES CBC    Component Value Date/Time   WBC 9.3 04/30/2016 0631   RBC 4.89 04/30/2016 0631   HGB 15.4 (H) 04/30/2016 0631   HGB 15.8 03/30/2014 1353   HCT 47.2 (H) 04/30/2016 0631   HCT 46.7 03/30/2014 1353   PLT 239 04/30/2016 0631   PLT 237 03/30/2014 1353   MCV 96.5 04/30/2016 0631   MCV 94 03/30/2014  1353   MCH 31.5 04/30/2016 0631   MCHC 32.6 04/30/2016 0631   RDW 13.2 04/30/2016 0631   RDW 13.8 03/30/2014 1353   LYMPHSABS 2.5 04/18/2016 1226   LYMPHSABS 1.8 03/30/2014 1353   MONOABS 0.7 04/18/2016 1226   MONOABS 1.0 (H) 03/30/2014 1353   EOSABS 0.1 04/18/2016 1226   EOSABS 0.1 03/30/2014 1353   BASOSABS 0.0 04/18/2016 1226   BASOSABS 0.1 03/30/2014 1353   CMP    Component Value Date/Time   NA 140 04/30/2016 0631   NA 135 (L) 03/30/2014 1353   K 5.9 (H) 04/30/2016 0631   K 3.7 03/30/2014 1353   CL 104 04/30/2016 0631   CL 105 03/30/2014 1353   CO2 25 04/30/2016 0631   CO2 22 03/30/2014 1353   GLUCOSE 151 (H) 04/30/2016 0631   GLUCOSE 117 (H) 03/30/2014 1353   BUN 11 04/30/2016 0631   BUN 13 03/30/2014 1353   CREATININE 0.76  04/30/2016 0631   CREATININE 0.73 03/30/2014 1353   CALCIUM 10.5 (H) 04/30/2016 0631   CALCIUM 9.4 03/30/2014 1353   PROT 6.9 04/28/2016 0236   PROT 8.4 (H) 03/30/2014 1353   ALBUMIN 3.7 04/28/2016 0236   ALBUMIN 3.8 03/30/2014 1353   AST 23 04/28/2016 0236   AST 23 03/30/2014 1353   ALT 37 04/28/2016 0236   ALT 29 03/30/2014 1353   ALKPHOS 73 04/28/2016 0236   ALKPHOS 125 (H) 03/30/2014 1353   BILITOT 0.4 04/28/2016 0236   BILITOT 0.6 03/30/2014 1353   GFRNONAA >60 04/30/2016 0631   GFRNONAA >60 03/30/2014 1353   GFRAA >60 04/30/2016 0631   GFRAA >60 03/30/2014 1353   COAGS Lab Results  Component Value Date   INR 1.73 04/30/2016   INR 1.01 04/29/2016   INR 1.01 04/28/2016   Lipid Panel    Component Value Date/Time   CHOL 118 04/22/2016 0357   TRIG 91 04/22/2016 0357   HDL 40 (L) 04/22/2016 0357   CHOLHDL 3.0 04/22/2016 0357   VLDL 18 04/22/2016 0357   LDLCALC 60 04/22/2016 0357   HgbA1C  Lab Results  Component Value Date   HGBA1C 8.1 (H) 04/22/2016   Urinalysis    Component Value Date/Time   COLORURINE STRAW (A) 04/18/2016 1226   APPEARANCEUR CLEAR (A) 04/18/2016 1226   APPEARANCEUR Clear 03/30/2014 1354   LABSPEC 1.008 04/18/2016 1226   LABSPEC 1.015 03/30/2014 1354   PHURINE 7.0 04/18/2016 1226   GLUCOSEU >500 (A) 04/18/2016 1226   GLUCOSEU Negative 03/30/2014 1354   HGBUR 1+ (A) 04/18/2016 1226   BILIRUBINUR NEGATIVE 04/18/2016 1226   BILIRUBINUR Negative 03/30/2014 1354   KETONESUR TRACE (A) 04/18/2016 1226   PROTEINUR 30 (A) 04/18/2016 1226   UROBILINOGEN 0.2 02/25/2015 0049   NITRITE NEGATIVE 04/18/2016 1226   LEUKOCYTESUR TRACE (A) 04/18/2016 1226   LEUKOCYTESUR Trace 03/30/2014 1354   Alcohol Level    Component Value Date/Time   ETH <5 04/18/2016 1226     SIGNIFICANT DIAGNOSTIC STUDIES CT VENOGRAM HEAD 04/29/2016 CT HEAD: Evolving LEFT parietal occipital lobe hemorrhage with local mass effect, no midline shift. Trace LEFT occipital  lobe subarachnoid hemorrhage, alternately thrombosed cortical vein.  CTV head: Bilateral transverse sinus nonocclusive arachnoid granulation without convincing evidence of dural venous sinus thrombosis.  Abnormal enhancement along the inferior margin of the hemorrhage, though this could represent transient hyperemia, underlying vascular malformation or mass is a consideration. Recommend 6 week follow-up MRI of the head with contrast. Alternatively CTA or catheter angiography could be done  sooner.  MRI HEAD08/08/2016 1. Similar size and appearance of acute right temporal occipital intraparenchymal hematoma measuring 4.0 x 2.8 x 2.7 cm (estimated volume 15 cc). Similar localized edema with trace 3 mm left-to-right shift. 2. Intraventricular extension with blood in the lateral and third ventricles. No evidence for hydrocephalus or ventricular trapping. 3. Small volume acute subarachnoid hemorrhage within the posterior cerebral hemispheres bilaterally, stable from prior CT. 4. Trace left subdural hemorrhage measuring up to 2 mm, difficult to visualize on prior study, but felt to be unchanged.  MRA HEAD 04/20/2016 1. Negative intracranial MRA. No vascular abnormality identified underlying the acute hemorrhage. 2. No large or proximal arterial branch occlusion. No high-grade or correctable stenosis. 3. Multi focal atheromatous irregularity involving the anterior posterior circulations as above, predominantly involving the distal small vessels.  CT head w/o contrast 04/18/2016 Large intraparenchymal hemorrhage is noted in left posterior parietal cortex with extension into the left lateral and third ventricles. Small amount of left-sided subarachnoid hemorrhage is noted. 4 mm of left-to-right midline shift is noted.     HISTORY OF PRESENT ILLNESS 50 year old woman who presented to [place] with a complaint of diffuse pain as well as headache. She has a known history of hepatitis  C, sciatica, recent history of congestive heart failure, and drug seeking behavior. She presented to the emergency room with a complaint of headache and diffuse pain. A CT of the brain was performed and revealed the presence of a large left parieto-occipital hematoma with significant intraventricular extension. [facility] definitive care.  HOSPITAL COURSE Large left parieto-occipital hemorrhage with IVH, likely hypertensive etiology CT of the brain revealed a large left parietal occipital hemorrhage with intraventricular extension. She had slight slurring of her speech and mild right upper extremity paresis. She was otherwise neurologically intact. She was seen in consultation by neurosurgery, Dr. Tressie StalkerJeffrey Jenkins, who felt she did not need surgery or ventriculostomy. She was admitted to the neuro ICU for close neurologic observation. Repeat CT head at 1859 on 8/10 without significant change, 2mm L shift with some effacement of L basilar cisterns. MR/MRA head on 8/12 with stable right temporal occipital intraparenchymal hematoma, localized edema with trace 3mm left to right shift, intraventricular extension with blood in the lateral and third ventricles. Small volume acute subarachnoid hemorrhage within posterior cerebral hemispheres bilaterally, trace left subdural hemorrhage, negative MRA. Echo with EF 65-70%, grade 1 DD. LDL 60. Hgb A1c 8.1%. Due to continued headache, she had a repeat CT head 04/24/2016 which was unchanged, no hydrocephalus but concerning for left transverse sinus thrombosis which was also seen on MRV. She was started on heparin gtt. Repeat head 8/20 showed expected evolution of the posterior left hemisphere intra-axial hemorrhage which is stable in size and slightly less distinct with less surrounding edema. Stable mild regional mass effect with no midline shift. CT venogram on 8/21 demonstrated bilateral arachnoid granulation and no dural venous sinus  thrombosis. Anticoagulation was discontinued. She was discharged home with follow up with neurology in 2 months and her PCP in 2 weeks.  Abnormal EEG She had positive EEG with potential are of epileptogenicity. She was placed on Keppra BID. No episodes of seizures during hospitalization.  Hypertensive Emergency BP 203/116 on arrival to [place] in setting of neurologic symptoms. She was placed on a Cardene gtt with SBP goal < 160 x 24h then < 180. BP was 88/67 prior to discharge, so amlodipine was decreased. She was discharged on amlodipine 5mg  daily, losartan  50mg  BID, labetalol 100mg  TID.  Diabetes Mellitus, type 2 Metformin was continued on discharge. Her glipizide was increased to twice a day.  Headache Improved with Fioricet prn. Continued on discharge.   Cigarette smoker, advised to stop smoking  DISCHARGE EXAM Blood pressure (!) 88/67, pulse 95, temperature 97.9 F (36.6 C), temperature source Oral, resp. rate 19, height 5\' 3"  (1.6 m), weight 92.9 kg (204 lb 12.8 oz), SpO2 99 %. Obese middle aged caucasian lady not in distress. Afebrile. Head is nontraumatic. Neck is supple without bruit.  Cardiac exam no murmur or gallop.  Lungs are clear to auscultation.  Abdomen:  Good bowel sounds Distal pulses are well felt.  Neurological Exam:  Awake and alert.  Sitting up in bed Fluent speech Follows simple command but seems distracted and confused by complex multistep commands.   Pupils equal reactive. R field defect.  Facial symmetric. SCMs intact bilaterally.   Motor symmetric bilaterally 4/5.  Sensation symmetric to light touch  Gait observed to be slow and wide based  Discharge Diet   Diet heart healthy/carb modified Room service appropriate? Yes; Fluid consistency: Thin liquids  DISCHARGE PLAN  Disposition:  Home   No antithrombotic for secondary stroke prevention.  Ongoing risk factor control by Primary Care Physician at time of discharge  Follow-up [facility] in 2 weeks.  Follow-up with Dr. Marvel Plan, Stroke Clinic in 2 months, office to schedule an appointment.  30 minutes were spent preparing discharge.  Griffin Basil, MD  Internal Medicine PGY-3 04/30/16 3:24 PM   I have personally examined this patient, reviewed notes, independently viewed imaging studies, participated in medical decision making and plan of care. I have made any additions or clarifications directly to the above note. Agree with note above.  Delia Heady, MD Medical Director [facility] Pager: [phone] 04/30/2016 7:32 PM

## 2016-04-30 NOTE — Progress Notes (Signed)
STROKE TEAM PROGRESS NOTE   SUBJECTIVE (INTERVAL HISTORY) No family at bedside today. More pleasant today. She still has some expressive aphasia. No new complaints otherwise. Ready to go home.    OBJECTIVE Temp:  [97.8 F (36.6 C)-98.9 F (37.2 C)] 97.9 F (36.6 C) (08/22 0752) Pulse Rate:  [73-95] 95 (08/22 0752) Cardiac Rhythm: Normal sinus rhythm (08/22 0800) Resp:  [11-27] 19 (08/22 0752) BP: (83-142)/(56-101) 88/67 (08/22 1040) SpO2:  [95 %-100 %] 99 % (08/22 0752)  CBC:   Recent Labs Lab 04/28/16 0236 04/30/16 0631  WBC 8.3 9.3  HGB 14.9 15.4*  HCT 45.5 47.2*  MCV 95.6 96.5  PLT 236 239    Basic Metabolic Panel:   Recent Labs Lab 04/28/16 0236 04/30/16 0631  NA 140 140  K 3.2* 5.9*  CL 102 104  CO2 25 25  GLUCOSE 114* 151*  BUN 6 11  CREATININE 0.55 0.76  CALCIUM 9.9 10.5*    IMAGING Ct Venogram Head  Result Date: 04/29/2016 CLINICAL DATA:  Follow-up intracranial hemorrhage. Assess venous sinus thrombosis. History of hepatitis-C, hypertension. EXAM: CT VENOGRAM HEAD TECHNIQUE: Axial 4 mm noncontrast CT images from the vertex to the foramen magnum followed by axial 2 mm contrast-enhanced CT images with coronal and sagittal reformations. CONTRAST:  75mL ISOVUE-300 IOPAMIDOL (ISOVUE-300) INJECTION 61% COMPARISON:  CT HEAD May 06, 2016 and MRV April 24, 2016 and MRI/MRA head April 19, 2016 FINDINGS: CT HEAD: BRAIN: Evolving 2.8 x 4.1 x 2.8 cm (volume = 17 cm^3) LEFT parietal occipital lobe hemorrhage with surrounding low-density vasogenic edema. Trace LEFT occipital subarachnoid density. Local mass-effect, and no midline shift. Partially effaced LEFT occipital horn, no hydrocephalus. No intraventricular blood products. No acute large vascular territory infarcts. No abnormal extra-axial fluid collections. Basal cisterns are patent. VASCULAR: Trace calcific atherosclerosis of the carotid siphons. No abnormal density within the major dural venous sinuses.  SKULL/SOFT TISSUES: No skull fracture. No significant soft tissue swelling. ORBITS/SINUSES: The included ocular globes and orbital contents are normal.Small LEFT maxillary mucosal retention cyst. Mild ethmoid sinusitis. Status post RIGHT antrectomy and turbinectomy. OTHER: None. CTV HEAD: Homogeneous normal contrast opacification of the superior sagittal sinus and torcula. Lobulated filling defects within the LEFT greater than RIGHT transverse sinuses most compatible with arachnoid granulations without discrete thrombus. Normal appearance of the sigmoid sinus and included internal jugular veins. Irregular approximate 15 x 4 mm abnormal enhancement along the inferior margin of the hemorrhage with tangled appearance. IMPRESSION: CT HEAD: Evolving LEFT parietal occipital lobe hemorrhage with local mass effect, no midline shift. Trace LEFT occipital lobe subarachnoid hemorrhage, alternately thrombosed cortical vein. CTV head: Bilateral transverse sinus nonocclusive arachnoid granulation without convincing evidence of dural venous sinus thrombosis. Abnormal enhancement along the inferior margin of the hemorrhage, though this could represent transient hyperemia, underlying vascular malformation or mass is a consideration. Recommend 6 week follow-up MRI of the head with contrast. Alternatively CTA or catheter angiography could be done sooner. Electronically Signed   By: Awilda Metro M.D.   On: 04/29/2016 17:36     PHYSICAL EXAM Obese middle aged caucasian lady not in distress. Afebrile. Head is nontraumatic. Neck is supple without bruit.  Cardiac exam no murmur or gallop.  Lungs are clear to auscultation.  Abdomen:  Good bowel sounds Distal pulses are well felt.  Neurological Exam :  Awake and alert.  Sitting up in bed Fluent speech Follows simple command but seems distracted and confused by complex multistep commands.   Pupils equal reactive. R field  defect.  Facial symmetric. SCMs intact bilaterally.    Motor symmetric bilaterally 4/5.  Sensation symmetric to light touch  Gait observed to be slow and wide based   ASSESSMENT/PLAN Stephanie [name] is a 50 y.o. female with history of hepatitis C,sciatica, CHF, and drug seeking behavior presenting to [place] with complaints of diffuse pain and HA. CT showed a moderate L parieto-occipital hemorrhage with IVH. [facility]  ICH:  moderate left parieto-occipital hemorrhage with resultant IVH, SAH, secondary to L transverse sinus thrombosis  Resultant - confused with HA  Neurosurgery consult - no surgery or IVC, manage BP - signed off 04/22/2016  CT at [place] moderate L posterior parietal cortex hemorrhage with IVH, small SAH L temporal and posterior parietal. 4mm L shift  Repeat CT head no significant change, 2mm L shift with some effacement of L basilar cistern  MRI without contrast - acute left temporal occipital intraparenchymal hematoma, L to R shift (3mm), IVH, small volume SAH, SDH  MRA - negative  Repeat CT head 04/24/2016. Unchanged, no hydrocephalus but concerning for left transverse sinus thrombosis  MRV nonocclusive L transverse sinus thrombosis  Repeat CT head 04/28/2016 Expected evolution of the posterior left hemisphere intra-axial hemorrhage which is stable in size and slightly less distinct with less surrounding edema. Stable mild regional mass effect with no midline shift.  CT venogram 04/29/2016 evolving hemorrhage with local mass effect, no midline shift, bilateral arachnoid granulation and no dural venous sinus thrombosis.  LDL 60  HgbA1c 8.1  2D Echo - EF 65-70%. No cardiac source of emboli identified.  Diet heart healthy/carb modified Room service appropriate? Yes; Fluid consistency: Thin   BC Powders prior to admission  Ongoing aggressive stroke risk factor management  Therapy recommendations:  SNF recommended but patient refused. Wants to go home.   Disposition:  home today. Follow up with Dr. Roda ShuttersXu in 2 months.  Headache  Repeat CT head Unchanged, no hydrocephalus  On Fioricet when necessary.   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  Discontinued warfarin and heparin gtt.  Abnormal EEG  EEG positive for potential area of epileptogenicity  On Keppra bid  Repeat EEG no seizure like activity - FIRDA and diffuse slowing  Hypertensive Emergency  BP 203/116 on arrival to [place] in setting of neurologic symptoms  Off cardene drip.   On norvasc 10 qd, losartan 50 bid and labetalol 100mg  tid  Stable  Long-term BP goal normotensive  Diabetes  HgbA1c 8.1, goal < 7.0  Uncontrolled  CBG monitoring  On SSI  On glucotrol, increased to bid dosing  Changed diet to DM diet  Appreciate DM RN recs  Tobacco abuse  Current smoker  Smoking cessation counseling provided  Pt is willing to quit  Other Stroke Risk Factors  Obesity, Body mass index is 36.28 kg/m.  recommend weight loss, diet and exercise as appropriate   Congestive Heart Failure  Hypokalemia 3.4 -> 3.6 -> 3.2 -> 5.9. Discontinued potassium supplementation. Recheck at outpatient follow up with PCP  Ammonia level sent -> 20 ( 9-35 )  Hospital day # 12  Discussed with Dr. Bernerd LimboSethi  Jennifer Krall, MD  Internal Medicine PGY-3 04/30/16 11:20 AM I have personally examined this patient, reviewed notes, independently viewed imaging studies, participated in medical decision making and plan of care. I have made any additions or clarifications directly to the above note. Agree with note above.   Delia HeadyPramod Sethi,  MD Medical Director [facility] Pager: [phone] 04/30/2016 2:44 PM   To contact Stroke Continuity provider, please refer to [email or web address]. After hours, contact General Neurology

## 2016-04-30 NOTE — Progress Notes (Signed)
Explained and discussed discharge instructions to pt. Pt being discharged to home via w/c with belongings. F/u appt and prescriptions given.

## 2016-05-01 ENCOUNTER — Other Ambulatory Visit: Payer: Self-pay | Admitting: Pulmonary Disease

## 2016-05-01 DIAGNOSIS — I619 Nontraumatic intracerebral hemorrhage, unspecified: Secondary | ICD-10-CM

## 2016-05-01 NOTE — Care Management Note (Signed)
Case Management Note  Patient Details  Name: Stephanie GuerinDianna B [name] MRN: [id number] Date of Birth: Jan 08, 1966  Subjective/Objective:   NCM spoke with patient ,she states she would like to have HHPT/OT with [facility], NCM notified PA to put orders in , patient was discharged yesterday.  [facility]  For HHPT/OT.                 Action/Plan:   Expected Discharge Date:                  Expected Discharge Plan:  Home w Home Health Services  In-House Referral:     Discharge planning Services  CM Consult  Post Acute Care Choice:    Choice offered to:     DME Arranged:    DME Agency:     HH Arranged:  PT, OT HH Agency:  [facility]  Status of Service:  Completed, signed off  If discussed at Long Length of Stay Meetings, dates discussed:    Additional Comments:  Leone Havenaylor, Cassiopeia Florentino Clinton, RN 05/01/2016, 11:16 AM

## 2016-05-01 NOTE — Progress Notes (Signed)
Orders placed for home health PT/OT 

## 2016-05-06 ENCOUNTER — Other Ambulatory Visit: Payer: Self-pay

## 2016-05-06 ENCOUNTER — Other Ambulatory Visit: Payer: Self-pay | Admitting: Neurology

## 2016-05-06 MED ORDER — BUTALBITAL-APAP-CAFFEINE 50-325-40 MG PO TABS: 1.0000 | ORAL_TABLET | Freq: Four times a day (QID) | ORAL | 0 refills | Status: DC | PRN

## 2016-05-06 NOTE — Telephone Encounter (Addendum)
Prescription done by Dr. Pearlean BrownieSethi. Pt states she does not have a PCP. Pt only give 30 days. Pt has new hospital appt in 06/2016. Dr.Sethi is the work in.

## 2016-05-06 NOTE — Telephone Encounter (Signed)
Fioricet needs to have paper prescription. I am not at clinic now, could you please ask Dr. Pearlean BrownieSethi to prescribe the fioricet? Dr. Pearlean BrownieSethi saw the pt in the hospital too, and he is familiar with the pt. Thanks.  Marvel PlanJindong Mara Favero, MD PhD Stroke Neurology 05/06/2016 4:37 PM

## 2016-05-06 NOTE — Telephone Encounter (Signed)
Patient called to request refill of FIORICET, states she still has headache. Patient advised to contact PCP, patient states she doesn't have a PCP.

## 2016-05-06 NOTE — Telephone Encounter (Signed)
Rn call patient back about floricet prescription. Pt stated she does have prescription for floricet from 04/30/2016 from hospital MD. Rn stated we cannot give her another prescription 5 days after the original one. Pt stated [facility] but is trying to find a PCP in [place]. Rn stated her neurologist is Dr. Roda ShuttersXu but not her PCP. [facility]. Rn stated if she cannot a PCP when the current prescription runs out to call our office. PT will call within 27 days to get refill on floricet. When pt finds new PCP she will call us.

## 2016-05-07 ENCOUNTER — Telehealth: Payer: Self-pay

## 2016-05-07 NOTE — Telephone Encounter (Signed)
RN call Cleotis NipperMartie Kirchoff Rn at [phone]. Rn stated patient was just discharge 04/30/2016.Pt also has appt in 06/2016. Rn stated that we are her neurologist not her PCP. Martie stated she just wanted the form filled out after office visit, and the notes fax to her. Martie verbalized understand and will wait till 06/2016.

## 2016-05-08 ENCOUNTER — Other Ambulatory Visit: Payer: Self-pay | Admitting: *Deleted

## 2016-05-08 NOTE — Patient Outreach (Addendum)
[facility] Care Management  05/08/2016  [name] November 14, 1965 [id number]  Subjective: Telephone call to patient's home number, spoke with patient, and HIPAA verified.  [facility] EMMI Stroke follow up, patient voices understanding, and is in agreement to complete follow up.  Patient states she is doing great and just returned from a beach trip. States she does not have any problems with medications.  States she has received home health evaluation for physical therapy by Thayer Ohmhris at [facility] and has no needs from a physical therapy standpoint.   Patient states she is in need of some speech therapy to assist with wording finding and she will follow up with her new primary regarding speech therapy on 05/09/16.  States she is also working on her speech independently with cards she obtained from [employer].  States she lost her balance on 05/07/16, fell and hit her head, while lending over to kiss her grandson.  States she has been having a headache and has made an appointment to to see her primary on 05/09/16.  She also has a knot on left arm that is resolving from hospitalization and  She is planning to discuss with primary MD.  States she is aware of signs and symptoms to follow up with MD, urgent care, or ED as needed.   States she is also planning to discuss obtain Advanced Directives with her primary MD.  Patient states she does not have any additional EMMI Stroke follow up needs at this time.  Objective: Per chart review: Patient hospitalized 04/18/16 - 04/30/16 for intraventricular hemorrhage and intracerebral hemorrhage.   Patient also has a history of Cytotoxic brain edema, diabetes, and hypertension.    Assessment: Received EMMI Stroke referral on 05/07/16.   Red flag alert trigger: Patient answered yes to questions / problems with meds.   Trigger date: 05/06/16.  Day #3.   EMMI follow up completed and no additional needs at this time.   Will proceed with  case closure.   Plan: RNCM send case closure due to follow up completed / no care management needs request to Tomasita [facility]rum[facility]Lavelda Com[facility].   Giannina Bartolome H. Gardiner Barefootooper RN, BSN, CCM [facility] Care Management [facility] Telephonic CM Phone: [phone] Fax: [phone]

## 2016-05-09 ENCOUNTER — Telehealth: Payer: Self-pay | Admitting: Vascular Surgery

## 2016-05-09 DIAGNOSIS — M5030 Other cervical disc degeneration, unspecified cervical region: Secondary | ICD-10-CM | POA: Insufficient documentation

## 2016-05-09 DIAGNOSIS — F319 Bipolar disorder, unspecified: Secondary | ICD-10-CM | POA: Insufficient documentation

## 2016-05-09 DIAGNOSIS — M419 Scoliosis, unspecified: Secondary | ICD-10-CM | POA: Insufficient documentation

## 2016-05-09 DIAGNOSIS — K589 Irritable bowel syndrome without diarrhea: Secondary | ICD-10-CM | POA: Insufficient documentation

## 2016-05-09 DIAGNOSIS — E785 Hyperlipidemia, unspecified: Secondary | ICD-10-CM | POA: Insufficient documentation

## 2016-05-09 DIAGNOSIS — K219 Gastro-esophageal reflux disease without esophagitis: Secondary | ICD-10-CM | POA: Insufficient documentation

## 2016-05-09 DIAGNOSIS — F064 Anxiety disorder due to known physiological condition: Secondary | ICD-10-CM | POA: Insufficient documentation

## 2016-05-09 DIAGNOSIS — M5440 Lumbago with sciatica, unspecified side: Secondary | ICD-10-CM | POA: Insufficient documentation

## 2016-05-09 DIAGNOSIS — K5730 Diverticulosis of large intestine without perforation or abscess without bleeding: Secondary | ICD-10-CM | POA: Insufficient documentation

## 2016-05-09 NOTE — Telephone Encounter (Signed)
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 Dr. Roda ShuttersXu is seeing her and she needs appt with him for followup. We ain't got no dog in that hunt.   :) ----- Message ----- From: Shari ProwsAnnette W Tobin Sent: 05/06/2016   2:52 PM To: Sharee PimpleMarilyn K McChesney, RN  Joyce GrossKay, This patient called requesting an apt with [facility] regarding a vascular stroke. I did not see a staff message or anything in her chart/hospital discharge regarding this appt. Can you please help me with this one? Thanks, Drinda ButtsAnnette

## 2016-05-09 NOTE — Telephone Encounter (Signed)
I left message for the patient regarding the information from Kay/awt

## 2016-05-10 ENCOUNTER — Other Ambulatory Visit: Payer: Self-pay | Admitting: Nurse Practitioner

## 2016-05-10 ENCOUNTER — Telehealth: Payer: Self-pay | Admitting: Neurology

## 2016-05-10 DIAGNOSIS — Z1231 Encounter for screening mammogram for malignant neoplasm of breast: Secondary | ICD-10-CM

## 2016-05-10 NOTE — Telephone Encounter (Signed)
Pt called back and stated that she is taking fioricet bid but she is wondering if she does not have HA whether she should still take it and how long she needs to take it.   I told her that this medication only PRN basis, if not HA, do not take it. The longer on the medication, the higher risk for rebound HA. She is also encouraged to use tylenol instead of fioricet. She expressed understanding and appreciation.   Marvel PlanJindong Mantaj Chamberlin, MD PhD Stroke Neurology 05/10/2016 4:04 PM

## 2016-05-10 NOTE — Telephone Encounter (Signed)
Called pt at 3:47pm and pt not available. Left VM for her to call back.   Marvel PlanJindong Izyan Ezzell, MD PhD Stroke Neurology 05/10/2016 3:48 PM

## 2016-05-10 NOTE — Telephone Encounter (Signed)
Pt called to advise she is still having HA's every other day. She said if she take fiorcet before she goes to bed she does not have a HA. She tries to take 1 am and 1pm but doesn't want to run out so she said she must be careful.  PCP ([facility]) prescribed prilosec yesterday at 1st appt. Please call

## 2016-05-14 ENCOUNTER — Other Ambulatory Visit: Payer: Self-pay | Admitting: Pulmonary Disease

## 2016-05-21 NOTE — Telephone Encounter (Signed)
[facility] be seen till 06/2016 for hospital follow up. Rn stated a phone call was made last month about no office notes. Rn explain we are her neurologist not her PCP. Martie ask who was the patients PCP. [facility]. Martie verbalized understanding.

## 2016-05-21 NOTE — Telephone Encounter (Signed)
[facility], Nurse Case Mgr/[facility] [phone] ext [phone] called to check status of records request from August 25th for most recent office notes, plan of care and medication list.

## 2016-05-23 ENCOUNTER — Other Ambulatory Visit: Payer: Self-pay | Admitting: Nurse Practitioner

## 2016-05-23 DIAGNOSIS — M79602 Pain in left arm: Secondary | ICD-10-CM

## 2016-05-26 ENCOUNTER — Other Ambulatory Visit: Payer: Self-pay | Admitting: Pulmonary Disease

## 2016-05-27 ENCOUNTER — Ambulatory Visit
Admission: RE | Admit: 2016-05-27 | Discharge: 2016-05-27 | Disposition: A | Discharge status: Home / Self Care | Payer: 59 | Source: Ambulatory Visit | Attending: Nurse Practitioner | Admitting: Nurse Practitioner

## 2016-05-27 DIAGNOSIS — M79602 Pain in left arm: Secondary | ICD-10-CM | POA: Diagnosis present

## 2016-05-27 DIAGNOSIS — I82612 Acute embolism and thrombosis of superficial veins of left upper extremity: Secondary | ICD-10-CM | POA: Insufficient documentation

## 2016-05-29 ENCOUNTER — Ambulatory Visit: Payer: PRIVATE HEALTH INSURANCE | Attending: Nurse Practitioner

## 2016-05-30 ENCOUNTER — Telehealth: Payer: Self-pay | Admitting: Neurology

## 2016-05-30 NOTE — Telephone Encounter (Signed)
Called back to patient and she stated that she had a node at left elbow up to her arm and had ultrasound at Dr. Driscilla Grammesew's office found to have large superficial venous thrombosis, but no DVT. She said that Dr. Wyn Quakerew is going to do more venous ultrasound for further evaluation.   I told her that I will send message to Dr. Wyn Quakerew that pt had left parietal occipital hemorrhage on 04/18/16. To evaluate the etiology of ICH, CT head concerning for left transverse sinus thrombosis. MRV done 04/24/16 also suspicious for left transverse sinus thrombosis at the junction of vein of Labbe. Therefore, heparin drip was started for treatment. However, CTV on 04/29/16 showed the abnormality seen on MRV may be due to arachnoid granulation instead of venous sinus thrombosis. Her heparin was discontinued. She was discharged on 04/30/16. After discharge, she complained of headache and was treated with tylenol and fioricet.   If she is found to have DVT in arms or legs this time, I really concerns that her brain bleed could be likely due to the venous sinus thrombosis. She may need further work up for hypercoagulable state. But for treatment wise, she has no contraindication for anticoagulation. For venous sinus thrombosis induced brain bleed, treatment is anticoagulation. Therefore, she can be considered as a candidate for anticoagulation if it is needed. I will let Dr. Wyn Quakerew know about it.   Pt expressed understanding and appreciation.   Marvel PlanJindong Kazandra Forstrom, MD PhD Stroke Neurology 05/30/2016 5:28 PM

## 2016-05-30 NOTE — Telephone Encounter (Signed)
Patient called to let Dr. Roda ShuttersXu know that she is seeing a vascular and vein Doctor today for blood clot in arm.

## 2016-05-31 ENCOUNTER — Ambulatory Visit: Payer: Self-pay | Admitting: Podiatry

## 2016-06-05 ENCOUNTER — Other Ambulatory Visit: Payer: Self-pay | Admitting: Pulmonary Disease

## 2016-06-07 IMAGING — CR DG ABDOMEN 2V
2 series · 2 of 2 positions shown · non-contrast
Comparison: 01/31/2014 .

CLINICAL DATA: Pain.

EXAM:
ABDOMEN - 2 VIEW

[w abdomen upright]
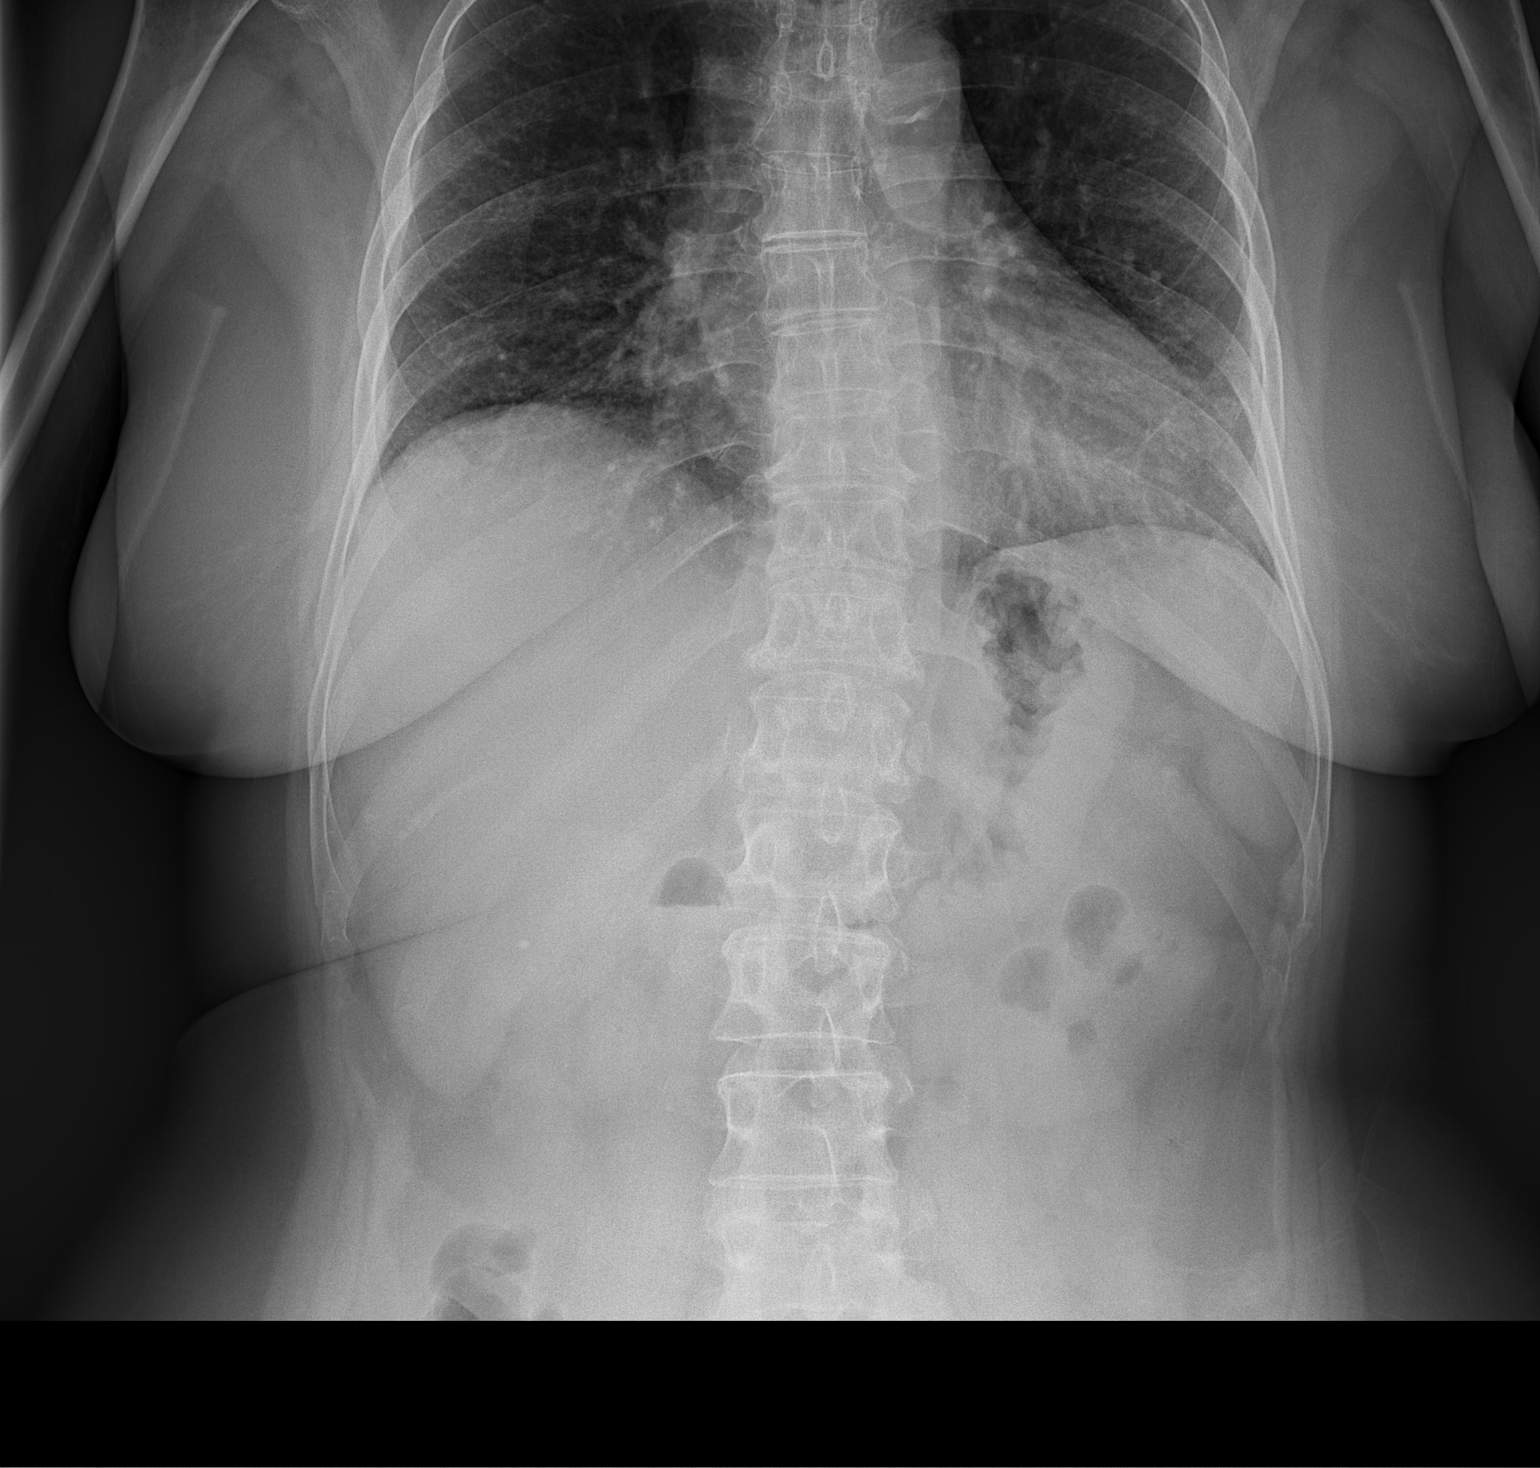

[t abdomen supine]
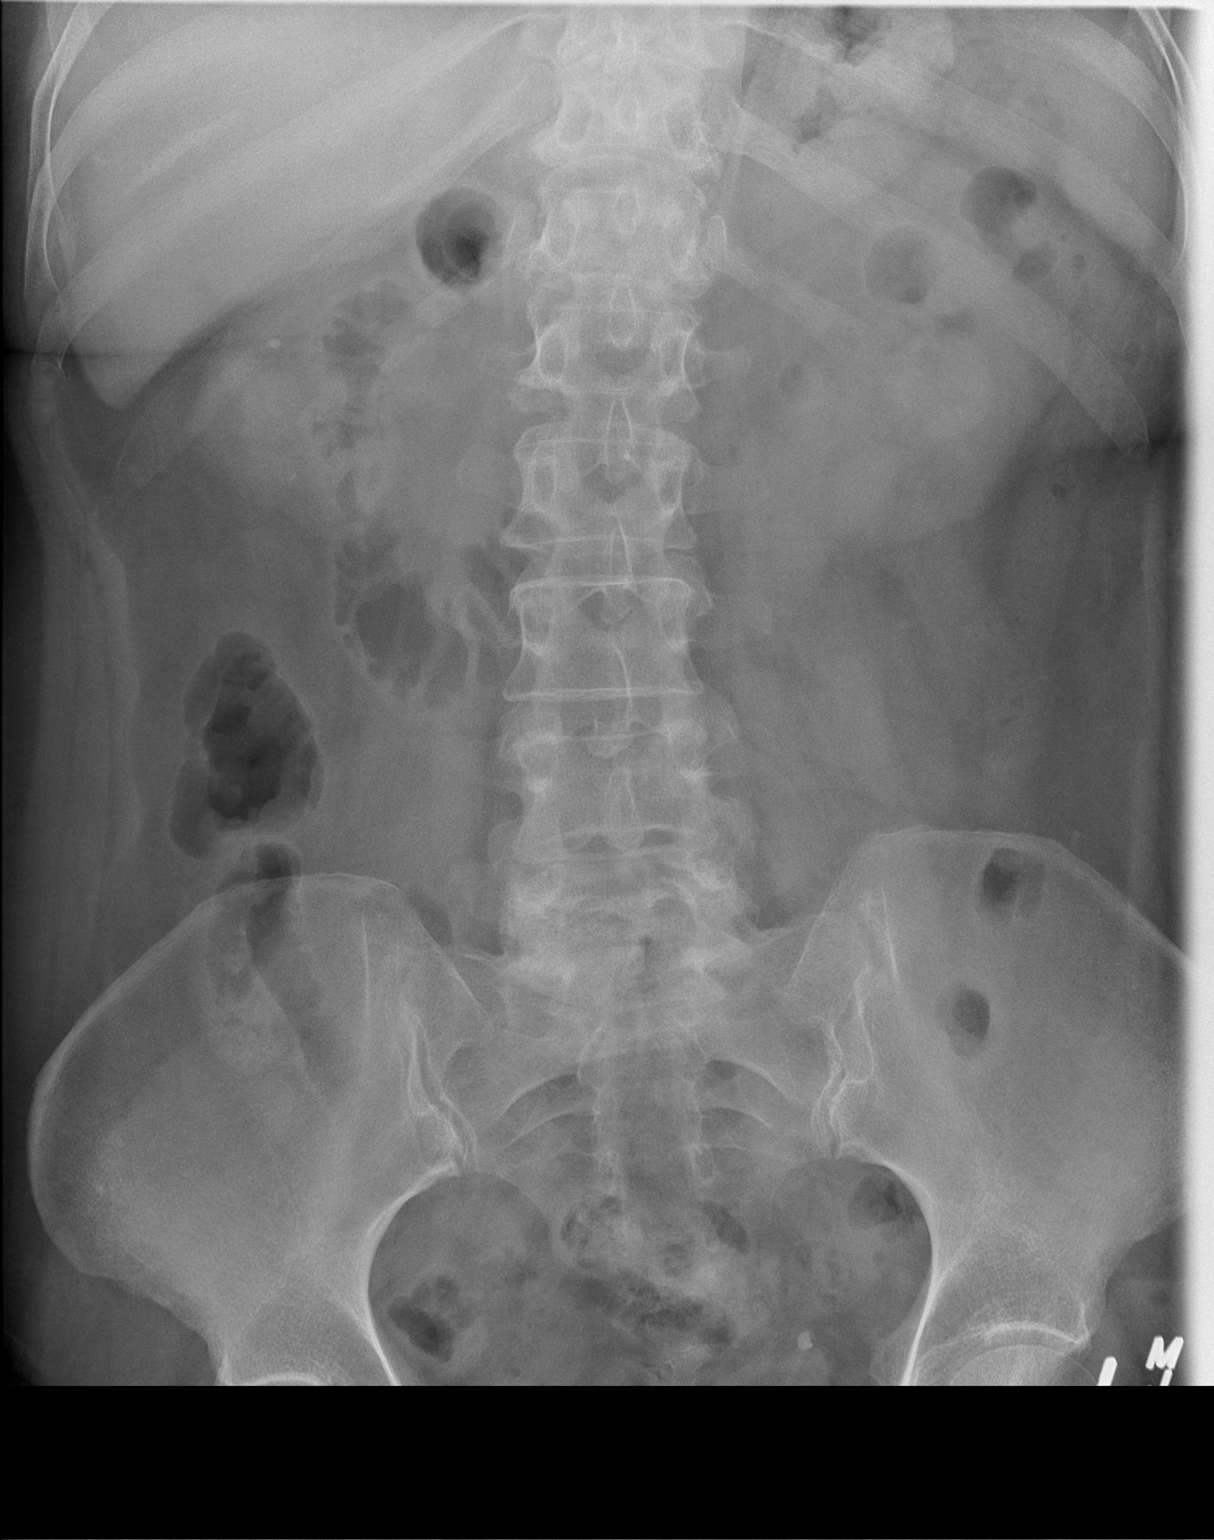

[2 of 2 positions shown; findings below may reference images not displayed]

FINDINGS: Soft tissue structures are unremarkable. Gas pattern is nonspecific.
No free air. Calcific density noted over right kidney. This is most
consistent with nephrolithiasis. This is stable. Pelvic
calcifications are noted appear these may represent phleboliths.
Distal ureteral stones cannot be excluded. Degenerative changes
lumbar spine and both hips.
IMPRESSION: 1. No acute abnormality.
2. Right nephrolithiasis.

## 2016-06-13 ENCOUNTER — Telehealth: Payer: Self-pay | Admitting: Neurology

## 2016-06-13 DIAGNOSIS — I82612 Acute embolism and thrombosis of superficial veins of left upper extremity: Secondary | ICD-10-CM

## 2016-06-13 NOTE — Telephone Encounter (Signed)
Pt still has HA, more at morning waking up. The HA at left side behind eye. Has some neck pain but not as bad as the HA. She still takes fioricet and helps some. However, she said her BP fluctate and for the last two days, BP was high. She has appointment with her PCP in am to discuss about BP issue. Also, she still has left elbow nodule and she has two more nodules along the vein. She will discuss with her PCP too.   She did have left UE SVT according to the test on 05/27/16. Due to concerns of her ICH may due to cerebral sinus thrombosis, I will do doppler of UEs and LEs to rule out DVT. I will also do hypercoagulable work up when she follow up with me this month on 06/27/16.   Marvel PlanJindong Christia Domke, MD PhD Stroke Neurology 06/13/2016 5:09 PM

## 2016-06-13 NOTE — Telephone Encounter (Signed)
Pt called in to let Dr. Roda ShuttersXu know she is still have really bad headaches. Please call and advise

## 2016-06-19 ENCOUNTER — Encounter (INDEPENDENT_AMBULATORY_CARE_PROVIDER_SITE_OTHER): Payer: Self-pay

## 2016-06-19 ENCOUNTER — Ambulatory Visit (INDEPENDENT_AMBULATORY_CARE_PROVIDER_SITE_OTHER): Payer: PRIVATE HEALTH INSURANCE | Admitting: Vascular Surgery

## 2016-06-19 ENCOUNTER — Encounter (INDEPENDENT_AMBULATORY_CARE_PROVIDER_SITE_OTHER): Payer: Self-pay | Admitting: Vascular Surgery

## 2016-06-19 ENCOUNTER — Ambulatory Visit (INDEPENDENT_AMBULATORY_CARE_PROVIDER_SITE_OTHER): Payer: PRIVATE HEALTH INSURANCE

## 2016-06-19 VITALS — BP 116/82 | HR 75 | Resp 16 | Ht 62.0 in | Wt 190.0 lb

## 2016-06-19 DIAGNOSIS — I808 Phlebitis and thrombophlebitis of other sites: Secondary | ICD-10-CM | POA: Diagnosis not present

## 2016-06-19 DIAGNOSIS — M79602 Pain in left arm: Secondary | ICD-10-CM

## 2016-06-19 DIAGNOSIS — I82612 Acute embolism and thrombosis of superficial veins of left upper extremity: Secondary | ICD-10-CM

## 2016-06-19 DIAGNOSIS — I619 Nontraumatic intracerebral hemorrhage, unspecified: Secondary | ICD-10-CM | POA: Diagnosis not present

## 2016-06-19 DIAGNOSIS — I10 Essential (primary) hypertension: Secondary | ICD-10-CM | POA: Diagnosis not present

## 2016-06-20 ENCOUNTER — Telehealth (INDEPENDENT_AMBULATORY_CARE_PROVIDER_SITE_OTHER): Payer: Self-pay

## 2016-06-20 ENCOUNTER — Other Ambulatory Visit (INDEPENDENT_AMBULATORY_CARE_PROVIDER_SITE_OTHER): Payer: Self-pay | Admitting: Vascular Surgery

## 2016-06-20 ENCOUNTER — Encounter (INDEPENDENT_AMBULATORY_CARE_PROVIDER_SITE_OTHER): Payer: Self-pay | Admitting: Vascular Surgery

## 2016-06-20 ENCOUNTER — Ambulatory Visit (INDEPENDENT_AMBULATORY_CARE_PROVIDER_SITE_OTHER): Payer: PRIVATE HEALTH INSURANCE | Admitting: Vascular Surgery

## 2016-06-20 VITALS — BP 106/75 | HR 75 | Resp 17 | Ht 62.0 in | Wt 191.0 lb

## 2016-06-20 DIAGNOSIS — B182 Chronic viral hepatitis C: Secondary | ICD-10-CM

## 2016-06-20 DIAGNOSIS — I619 Nontraumatic intracerebral hemorrhage, unspecified: Secondary | ICD-10-CM

## 2016-06-20 DIAGNOSIS — I10 Essential (primary) hypertension: Secondary | ICD-10-CM

## 2016-06-20 DIAGNOSIS — I82612 Acute embolism and thrombosis of superficial veins of left upper extremity: Secondary | ICD-10-CM

## 2016-06-20 NOTE — Telephone Encounter (Signed)
Rn spoke with [facility]Martie about a fax stating third request. Rn explain that two phone calls were made to her in Sept and Oct that pts appt is 06/27/2016. Rn stated the form cannot be filled out until pt is seen on 06/27/2016 or office notes. Martie stated someone was covering for her and did not know of patients appt. Marite verbalized understanding.

## 2016-06-20 NOTE — Telephone Encounter (Signed)
Can you put orders in for this patient she is having ligation of left basilic vein tomorrow with Dr. Gilda CreaseSchnier. Thank you

## 2016-06-20 NOTE — Progress Notes (Signed)
Subjective:    [name]: Stephanie [name], female    DOB: 02/04/1966, 50 y.o.   MRN: [id number] Chief Complaint  Patient presents with  . Follow-up    Patient presents sooner than her scheduled one month follow up for a left upper extremity SVT (basilic vein). Patient complaining of increased pain and now numbness in her left hand. Denies any fever, nausea or vomiting. Denies any ulceration to wound development to her hand. Patient underwent a left upper extremity duplex which was notable for propagation of her DVT now extending further proximally and distally in the extremity. She denies any SOB or chest pain.    Review of Systems  Constitutional: Negative.   HENT: Negative.   Eyes: Negative.   Respiratory: Negative.   Cardiovascular: Negative.        Increased pain in LUE.  Gastrointestinal: Negative.   Endocrine: Negative.   Genitourinary: Negative.   Musculoskeletal: Negative.   Skin: Negative.   Allergic/Immunologic: Negative.   Neurological: Positive for numbness (Left Hand Numbness).  Hematological: Negative.   Psychiatric/Behavioral: Negative.       Objective:   Physical Exam  Constitutional: She is oriented to person, place, and time. She appears well-developed and well-nourished.  HENT:  Head: Atraumatic.  Eyes: Conjunctivae and EOM are normal. Pupils are equal, round, and reactive to light.  Neck: Normal range of motion.  Cardiovascular: Normal rate, regular rhythm, normal heart sounds and intact distal pulses.   Pulses:      Radial pulses are 2+ on the right side, and 2+ on the left side.  Pulmonary/Chest: Effort normal and breath sounds normal.  Abdominal: Soft. Bowel sounds are normal.  Musculoskeletal: Normal range of motion. She exhibits edema (Mild Left Upper Extremity Edema).  Neurological: She is alert and oriented to person, place, and time.  Skin: Skin is warm and dry.  Psychiatric: She has a normal mood and affect. Her behavior is normal.  Judgment and thought content normal.   BP 116/82   Pulse 75   Resp 16   Ht 5\' 2"  (1.575 m)   Wt 190 lb (86.2 kg)   BMI 34.75 kg/m   Past Medical History:  Diagnosis Date  . Diverticulitis   . Hepatitis C   . Hypertension   . Kidney stones   . Sciatica    Social History   Social History  . Marital status: Married    Spouse name: N/A  . Number of children: N/A  . Years of education: N/A   Occupational History  . Not on file.   Social History Main Topics  . Smoking status: Current Every Day Smoker    Packs/day: 0.25    Years: 1.50    Types: Cigarettes  . Smokeless tobacco: Never Used  . Alcohol use No  . Drug use: No     Comment: electric cig  . Sexual activity: Not on file   Other Topics Concern  . Not on file   Social History Narrative  . No narrative on file   Past Surgical History:  Procedure Laterality Date  . ABDOMINAL HYSTERECTOMY    . KIDNEY STONE SURGERY Right 02/12/12  . KIDNEY STONE SURGERY Left 07/15/2012  . LITHOTRIPSY    . OOPHORECTOMY    . SINUS EXPLORATION     Family History  Problem Relation Age of Onset  . Heart failure Mother   . Diabetes Father   . Cancer Sister    Allergies  Allergen Reactions  . Aspirin  Itching  . Buprenorphine Hcl Itching  . Codeine Other (See Comments)    Makes her feel like something is crawling under her skin. Can take, sometimes makes her itch  . Compazine Itching    "makes my skin crawl"  . Ioxaglate Itching  . Ivp Dye [Iodinated Diagnostic Agents] Itching    Makes her itch really bad. Had to get 2-3 shots of Benadryl when she was in the hospital.  . Metrizamide Itching  . Morphine And Related Itching  . Naproxen Itching  . Norco [Hydrocodone-Acetaminophen] Itching  . Penicillins Itching and Other (See Comments)    Makes her feel really uncomfortable. No other information available.  . Prochlorperazine Maleate Other (See Comments)    Compazine makes her skin crawl - needs 2-3 shots of Benadryl to  get relief.  . Reglan [Metoclopramide] Itching and Other (See Comments)    Makes her skin crawl  . Tegretol [Carbamazepine] Itching and Other (See Comments)    Makes her feel like something is crawling under her skin.  . Toradol [Ketorolac Tromethamine] Itching  . Tramadol Itching      Assessment & Plan:  Patient presents sooner than her scheduled one month follow up for a left upper extremity SVT (basilic vein). Patient complaining of increased pain and now numbness in her left hand. Denies any fever, nausea or vomiting. Denies any ulceration to wound development to her hand. Patient underwent a left upper extremity duplex which was notable for propagation of her DVT now extending further proximally and distally in the extremity. She denies any SOB or chest pain.   1. Superficial venous thrombosis of arm, left - Worsening Worsening LUE SVT - now almost the entire length of the basilic vein. Patient may need possible ligation of basilic vein as we cannot anticoagulate her. Patient is allergic to ASA.   2. Nontraumatic intracerebral hemorrhage, unspecified cerebral location, unspecified laterality - Stable (HCC) Reason we are contraindicated to anticoagulate for worsening SVT.   3. Essential hypertension, malignant - Stable Encouraged good control as its slows the progression of atherosclerotic disease  Current Outpatient Prescriptions on File Prior to Visit  Medication Sig Dispense Refill  . alprazolam (XANAX) 2 MG tablet Take 2 mg by mouth daily as needed for anxiety.     Marland Kitchen amLODipine (NORVASC) 5 MG tablet Take 1 tablet (5 mg total) by mouth daily. 30 tablet 0  . busPIRone (BUSPAR) 10 MG tablet Take 10 mg by mouth 3 (three) times daily.    . butalbital-acetaminophen-caffeine (FIORICET, ESGIC) 50-325-40 MG tablet Take 1 tablet by mouth 2 (two) times daily as needed for headache.    . dicyclomine (BENTYL) 20 MG tablet Take 20 mg by mouth 2 (two) times daily.     . DULoxetine (CYMBALTA) 60  MG capsule Take 60 mg by mouth daily.    Marland Kitchen glipiZIDE (GLUCOTROL) 5 MG tablet Take 1 tablet (5 mg total) by mouth 2 (two) times daily before a meal. 60 tablet 0  . labetalol (NORMODYNE) 100 MG tablet Take 1 tablet (100 mg total) by mouth 3 (three) times daily. 90 tablet 0  . levETIRAcetam (KEPPRA) 500 MG tablet Take 1 tablet (500 mg total) by mouth 2 (two) times daily. 60 tablet 0  . losartan (COZAAR) 50 MG tablet Take 50 mg by mouth 2 (two) times daily.    . metFORMIN (GLUCOPHAGE) 500 MG tablet Take 1,000 mg by mouth 2 (two) times daily with a meal.    . OLANZapine zydis (ZYPREXA) 5 MG  disintegrating tablet Take 1 tablet (5 mg total) by mouth at bedtime. 30 tablet 0  . omeprazole (PRILOSEC) 20 MG capsule Take 40 mg by mouth daily.     No current facility-administered medications on file prior to visit.     There are no Patient Instructions on file for this visit. Return in about 1 day (around 06/20/2016) for To meet with Dr. Gilda Crease and discuss surgery.   Mattheus Rauls A Ron Junco, PA-C

## 2016-06-20 NOTE — Progress Notes (Signed)
MRN : [id number]  Stephanie [name] is a 50 y.o. (12-26-1965) female who presents with chief complaint of  Chief Complaint  Patient presents with  . Re-evaluation    Discuss surgery  .  History of Present Illness: Patient presents sooner than her scheduled one month follow up for a left upper extremity SVT (basilic vein). She was seen yesterday complaining of increased pain and now numbness in her left hand.   Duplex ultrasound done yesterday now shows propagation of thrombus proximally and distally.  The clot now extends up to the axillary vein.   No interval fever, nausea or vomiting. She continues to deny any SOB or chest pain.   Current Outpatient Prescriptions  Medication Sig Dispense Refill  . alprazolam (XANAX) 2 MG tablet Take 2 mg by mouth daily as needed for anxiety.     Marland Kitchen amLODipine (NORVASC) 5 MG tablet Take 1 tablet (5 mg total) by mouth daily. 30 tablet 0  . busPIRone (BUSPAR) 10 MG tablet Take 10 mg by mouth 3 (three) times daily.    . butalbital-acetaminophen-caffeine (FIORICET, ESGIC) 50-325-40 MG tablet Take 1 tablet by mouth 2 (two) times daily as needed for headache.    . cloNIDine (CATAPRES) 0.1 MG tablet daily.    . Diclofenac Sodium (PENNSAID) 2 % SOLN Place onto the skin 3 (three) times daily.    Marland Kitchen dicyclomine (BENTYL) 20 MG tablet Take 20 mg by mouth 2 (two) times daily.     . DULoxetine (CYMBALTA) 60 MG capsule Take 60 mg by mouth daily.    Marland Kitchen glipiZIDE (GLUCOTROL) 5 MG tablet Take 1 tablet (5 mg total) by mouth 2 (two) times daily before a meal. 60 tablet 0  . labetalol (NORMODYNE) 100 MG tablet Take 1 tablet (100 mg total) by mouth 3 (three) times daily. 90 tablet 0  . levETIRAcetam (KEPPRA) 500 MG tablet Take 1 tablet (500 mg total) by mouth 2 (two) times daily. 60 tablet 0  . losartan (COZAAR) 50 MG tablet Take 50 mg by mouth 2 (two) times daily.    . metFORMIN (GLUCOPHAGE) 500 MG tablet Take 1,000 mg by mouth 2 (two) times daily with a meal.    .  metoprolol (LOPRESSOR) 50 MG tablet     . OLANZapine zydis (ZYPREXA) 5 MG disintegrating tablet Take 1 tablet (5 mg total) by mouth at bedtime. 30 tablet 0  . omeprazole (PRILOSEC) 20 MG capsule Take 40 mg by mouth daily.     No current facility-administered medications for this visit.     Past Medical History:  Diagnosis Date  . Diverticulitis   . Hepatitis C   . Hypertension   . Kidney stones   . Sciatica     Past Surgical History:  Procedure Laterality Date  . ABDOMINAL HYSTERECTOMY    . KIDNEY STONE SURGERY Right 02/12/12  . KIDNEY STONE SURGERY Left 07/15/2012  . LITHOTRIPSY    . OOPHORECTOMY    . SINUS EXPLORATION      Social History Social History  Substance Use Topics  . Smoking status: Current Every Day Smoker    Packs/day: 0.25    Years: 1.50    Types: Cigarettes  . Smokeless tobacco: Never Used  . Alcohol use No    Allergies  Allergen Reactions  . Aspirin Itching  . Buprenorphine Hcl Itching  . Codeine Other (See Comments)    Makes her feel like something is crawling under her skin. Can take, sometimes makes her itch  .  Compazine Itching    "makes my skin crawl"  . Ioxaglate Itching  . Ivp Dye [Iodinated Diagnostic Agents] Itching    Makes her itch really bad. Had to get 2-3 shots of Benadryl when she was in the hospital.  . Metrizamide Itching  . Morphine And Related Itching  . Naproxen Itching  . Norco [Hydrocodone-Acetaminophen] Itching  . Penicillins Itching and Other (See Comments)    Makes her feel really uncomfortable. No other information available.  . Prochlorperazine Maleate Other (See Comments)    Compazine makes her skin crawl - needs 2-3 shots of Benadryl to get relief.  . Reglan [Metoclopramide] Itching and Other (See Comments)    Makes her skin crawl  . Tegretol [Carbamazepine] Itching and Other (See Comments)    Makes her feel like something is crawling under her skin.  . Toradol [Ketorolac Tromethamine] Itching  . Tramadol  Itching     REVIEW OF SYSTEMS (Negative unless checked)  Constitutional: [] Weight loss  [] Fever  [] Chills Cardiac: [] Chest pain   [] Chest pressure   [] Palpitations   [] Shortness of breath when laying flat   [] Shortness of breath at rest   [] Shortness of breath with exertion. Vascular:  [] Pain in legs with walking   [] Pain in legs at rest   [] History of DVT   [x] Phlebitis   [] Swelling in legs   [] Varicose veins Pulmonary:   [] Uses home oxygen   [] Productive cough   [] Hemoptysis   [] Wheeze  [] COPD   [] Asthma Neurologic:  [] Dizziness  [] Blackouts   [] Seizures   [x] History of stroke   [] History of TIA  [] Aphasia   [] Temporary blindness   [] Dysphagia   [] Weakness or numbness in arms   [] Weakness or numbness in legs Musculoskeletal:  [] Arthritis   [] Joint swelling   [] Joint pain   [] Low back pain Hematologic:  [] Easy bruising  [] Easy bleeding   [] Hypercoagulable state   [] Anemic   Gastrointestinal:   [] Vomiting  [] Gastroesophageal reflux/heartburn   [] Abdominal pain Genitourinary:  [] Chronic kidney disease   [] Difficult urination  [] Frequent urination  [] Burning with urination   [] Hematuria Skin:  [] Rashes   [] Ulcers   [] Wounds Psychological:  []  anxiety   []  depression.  Physical Examination  BP 106/75   Pulse 75   Resp 17   Ht 5' 2"  (1.575 m)   Wt 191 lb (86.6 kg)   BMI 34.93 kg/m  Gen:  WD/WN, NAD Head: [place]/AT, No temporalis wasting. Ear/Nose/Throat: Hearing grossly intact, nares w/o erythema or drainage, trachea midline Eyes: PERRLA.  Sclera non-icteric Neck: Supple.  No JVD.  Pulmonary:  Good air movement, no use of accessory muscles. Clear bilaterally Cardiac: RRR, normal S1, S2 Vascular:  Vessel Right Left  Radial Palpable Palpable  Ulnar Palpable Palpable  Brachial Palpable Palpable  Gastrointestinal: Non-tender/non-distended. No guarding.  Musculoskeletal: M/S 5/5 throughout.  No deformity or atrophy.  Neurologic: CN 2-12 intact. Pain and light touch intact in extremities.   Symmetrical.  Speech is fluent.  Psychiatric: Judgment intact, Mood & affect appropriate for pt's clinical situation. Dermatologic: No rashes or ulcers noted.  No cellulitis or open wounds.    Labs Recent Results (from the past 2160 hour(s))  Blood culture (routine x 2)     Status: None   Collection Time: 04/18/16 12:20 PM  Result Value Ref Range   Specimen Description BLOOD  LEFT HAND    Special Requests      BOTTLES DRAWN AEROBIC AND ANAEROBIC  ANA 11ML AER 12ML   Culture NO  GROWTH 5 DAYS    Report Status 04/23/2016 FINAL   CBC with Differential     Status: None   Collection Time: 04/18/16 12:26 PM  Result Value Ref Range   WBC 8.8 3.6 - 11.0 K/uL   RBC 4.75 3.80 - 5.20 MIL/uL   Hemoglobin 15.2 12.0 - 16.0 g/dL   HCT 44.1 35.0 - 47.0 %   MCV 92.7 80.0 - 100.0 fL   MCH 32.1 26.0 - 34.0 pg   MCHC 34.6 32.0 - 36.0 g/dL   RDW 13.6 11.5 - 14.5 %   Platelets 192 150 - 440 K/uL   Neutrophils Relative % 61 %   Neutro Abs 5.4 1.4 - 6.5 K/uL   Lymphocytes Relative 29 %   Lymphs Abs 2.5 1.0 - 3.6 K/uL   Monocytes Relative 8 %   Monocytes Absolute 0.7 0.2 - 0.9 K/uL   Eosinophils Relative 1 %   Eosinophils Absolute 0.1 0 - 0.7 K/uL   Basophils Relative 1 %   Basophils Absolute 0.0 0 - 0.1 K/uL  Comprehensive metabolic panel     Status: Abnormal   Collection Time: 04/18/16 12:26 PM  Result Value Ref Range   Sodium 138 135 - 145 mmol/L   Potassium 3.5 3.5 - 5.1 mmol/L   Chloride 103 101 - 111 mmol/L   CO2 24 22 - 32 mmol/L   Glucose, Bld 191 (H) 65 - 99 mg/dL   BUN 10 6 - 20 mg/dL   Creatinine, Ser 0.54 0.44 - 1.00 mg/dL   Calcium 9.4 8.9 - 10.3 mg/dL   Total Protein 7.9 6.5 - 8.1 g/dL   Albumin 4.4 3.5 - 5.0 g/dL   AST 43 (H) 15 - 41 U/L   ALT 45 14 - 54 U/L   Alkaline Phosphatase 94 38 - 126 U/L   Total Bilirubin 1.0 0.3 - 1.2 mg/dL   GFR calc non Af Amer >60 >60 mL/min   GFR calc Af Amer >60 >60 mL/min    Comment: (NOTE) The eGFR has been calculated using the CKD EPI  equation. This calculation has not been validated in all clinical situations. eGFR's persistently <60 mL/min signify possible Chronic Kidney Disease.    Anion gap 11 5 - 15  Lipase, blood     Status: None   Collection Time: 04/18/16 12:26 PM  Result Value Ref Range   Lipase 17 11 - 51 U/L  Troponin I     Status: None   Collection Time: 04/18/16 12:26 PM  Result Value Ref Range   Troponin I <0.03 <0.03 ng/mL  Urinalysis complete, with microscopic ([facility] only)     Status: Abnormal   Collection Time: 04/18/16 12:26 PM  Result Value Ref Range   Color, Urine STRAW (A) YELLOW   APPearance CLEAR (A) CLEAR   Glucose, UA >500 (A) NEGATIVE mg/dL   Bilirubin Urine NEGATIVE NEGATIVE   Ketones, ur TRACE (A) NEGATIVE mg/dL   Specific Gravity, Urine 1.008 1.005 - 1.030   Hgb urine dipstick 1+ (A) NEGATIVE   pH 7.0 5.0 - 8.0   Protein, ur 30 (A) NEGATIVE mg/dL   Nitrite NEGATIVE NEGATIVE   Leukocytes, UA TRACE (A) NEGATIVE   RBC / HPF 0-5 0 - 5 RBC/hpf   WBC, UA 6-30 0 - 5 WBC/hpf   Bacteria, UA RARE (A) NONE SEEN   Squamous Epithelial / LPF 0-5 (A) NONE SEEN  Blood culture (routine x 2)     Status: None   Collection Time: 04/18/16  12:26 PM  Result Value Ref Range   Specimen Description BLOOD LEFT WRIST    Special Requests      BOTTLES DRAWN AEROBIC AND ANAEROBIC  ANA 6ML AER 10ML   Culture NO GROWTH 5 DAYS    Report Status 04/23/2016 FINAL   TSH     Status: None   Collection Time: 04/18/16 12:26 PM  Result Value Ref Range   TSH 1.471 0.350 - 4.500 uIU/mL  T4, free     Status: None   Collection Time: 04/18/16 12:26 PM  Result Value Ref Range   Free T4 0.92 0.61 - 1.12 ng/dL    Comment: (NOTE) Biotin ingestion may interfere with free T4 tests. If the results are inconsistent with the TSH level, previous test results, or the clinical presentation, then consider biotin interference. If needed, order repeat testing after stopping biotin.   Ethanol     Status: None   Collection Time:  04/18/16 12:26 PM  Result Value Ref Range   Alcohol, Ethyl (B) <5 <5 mg/dL    Comment:        LOWEST DETECTABLE LIMIT FOR SERUM ALCOHOL IS 5 mg/dL FOR MEDICAL PURPOSES ONLY   Lactic acid, plasma     Status: Abnormal   Collection Time: 04/18/16 12:27 PM  Result Value Ref Range   Lactic Acid, Venous 2.3 (HH) 0.5 - 1.9 mmol/L    Comment: CRITICAL RESULT CALLED TO, READ BACK BY AND VERIFIED WITH  MEGAN JONES AT 1330 04/18/16 SDR   Lactic acid, plasma     Status: None   Collection Time: 04/18/16  4:14 PM  Result Value Ref Range   Lactic Acid, Venous 1.0 0.5 - 1.9 mmol/L  MRSA PCR Screening     Status: None   Collection Time: 04/18/16  7:02 PM  Result Value Ref Range   MRSA by PCR NEGATIVE NEGATIVE    Comment:        The GeneXpert MRSA Assay (FDA approved for NASAL specimens only), is one component of a comprehensive MRSA colonization surveillance program. It is not intended to diagnose MRSA infection nor to guide or monitor treatment for MRSA infections.   ECHOCARDIOGRAM COMPLETE     Status: None   Collection Time: 04/19/16  1:48 PM  Result Value Ref Range   Weight 3,329.83 oz   Height 63 in   BP 144/87 mmHg  Comprehensive metabolic panel     Status: Abnormal   Collection Time: 04/21/16  2:47 AM  Result Value Ref Range   Sodium 133 (L) 135 - 145 mmol/L   Potassium 3.0 (L) 3.5 - 5.1 mmol/L   Chloride 87 (L) 101 - 111 mmol/L   CO2 30 22 - 32 mmol/L   Glucose, Bld 190 (H) 65 - 99 mg/dL   BUN 15 6 - 20 mg/dL   Creatinine, Ser 0.64 0.44 - 1.00 mg/dL   Calcium 9.9 8.9 - 10.3 mg/dL   Total Protein 8.1 6.5 - 8.1 g/dL   Albumin 4.4 3.5 - 5.0 g/dL   AST 25 15 - 41 U/L   ALT 36 14 - 54 U/L   Alkaline Phosphatase 92 38 - 126 U/L   Total Bilirubin 1.0 0.3 - 1.2 mg/dL   GFR calc non Af Amer >60 >60 mL/min   GFR calc Af Amer >60 >60 mL/min    Comment: (NOTE) The eGFR has been calculated using the CKD EPI equation. This calculation has not been validated in all clinical  situations. eGFR's persistently <60 mL/min  signify possible Chronic Kidney Disease.    Anion gap 16 (H) 5 - 15  CBC     Status: Abnormal   Collection Time: 04/21/16  2:47 AM  Result Value Ref Range   WBC 11.6 (H) 4.0 - 10.5 K/uL   RBC 4.91 3.87 - 5.11 MIL/uL   Hemoglobin 15.3 (H) 12.0 - 15.0 g/dL   HCT 45.6 36.0 - 46.0 %   MCV 92.9 78.0 - 100.0 fL   MCH 31.2 26.0 - 34.0 pg   MCHC 33.6 30.0 - 36.0 g/dL   RDW 13.0 11.5 - 15.5 %   Platelets 250 150 - 400 K/uL  Basic metabolic panel     Status: Abnormal   Collection Time: 04/22/16  3:57 AM  Result Value Ref Range   Sodium 134 (L) 135 - 145 mmol/L   Potassium 3.4 (L) 3.5 - 5.1 mmol/L   Chloride 93 (L) 101 - 111 mmol/L   CO2 30 22 - 32 mmol/L   Glucose, Bld 194 (H) 65 - 99 mg/dL   BUN 13 6 - 20 mg/dL   Creatinine, Ser 0.66 0.44 - 1.00 mg/dL   Calcium 9.3 8.9 - 10.3 mg/dL   GFR calc non Af Amer >60 >60 mL/min   GFR calc Af Amer >60 >60 mL/min    Comment: (NOTE) The eGFR has been calculated using the CKD EPI equation. This calculation has not been validated in all clinical situations. eGFR's persistently <60 mL/min signify possible Chronic Kidney Disease.    Anion gap 11 5 - 15  CBC     Status: Abnormal   Collection Time: 04/22/16  3:57 AM  Result Value Ref Range   WBC 11.0 (H) 4.0 - 10.5 K/uL   RBC 4.78 3.87 - 5.11 MIL/uL   Hemoglobin 14.9 12.0 - 15.0 g/dL   HCT 43.9 36.0 - 46.0 %   MCV 91.8 78.0 - 100.0 fL   MCH 31.2 26.0 - 34.0 pg   MCHC 33.9 30.0 - 36.0 g/dL   RDW 12.7 11.5 - 15.5 %   Platelets 230 150 - 400 K/uL  Hemoglobin A1c     Status: Abnormal   Collection Time: 04/22/16  3:57 AM  Result Value Ref Range   Hgb A1c MFr Bld 8.1 (H) 4.8 - 5.6 %    Comment: (NOTE)         Pre-diabetes: 5.7 - 6.4         Diabetes: >6.4         Glycemic control for adults with diabetes: <7.0    Mean Plasma Glucose 186 mg/dL    Comment: (NOTE) Performed At: [facility] [place], [place] [id number] Lindon Romp MD EH:[id number]   Lipid panel     Status: Abnormal   Collection Time: 04/22/16  3:57 AM  Result Value Ref Range   Cholesterol 118 0 - 200 mg/dL   Triglycerides 91 <150 mg/dL   HDL 40 (L) >40 mg/dL   Total CHOL/HDL Ratio 3.0 RATIO   VLDL 18 0 - 40 mg/dL   LDL Cholesterol 60 0 - 99 mg/dL    Comment:        Total Cholesterol/HDL:CHD Risk Coronary Heart Disease Risk Table                     Men   Women  1/2 Average Risk   3.4   3.3  Average Risk       5.0   4.4  2 X Average Risk   9.6   7.1  3 X Average Risk  23.4   11.0        Use the calculated Patient Ratio above and the CHD Risk Table to determine the patient's CHD Risk.        ATP III CLASSIFICATION (LDL):  <100     mg/dL   Optimal  100-129  mg/dL   Near or Above                    Optimal  130-159  mg/dL   Borderline  160-189  mg/dL   High  >190     mg/dL   Very High   Glucose, capillary     Status: Abnormal   Collection Time: 04/22/16  9:39 PM  Result Value Ref Range   Glucose-Capillary 207 (H) 65 - 99 mg/dL  Glucose, capillary     Status: Abnormal   Collection Time: 04/23/16  6:02 AM  Result Value Ref Range   Glucose-Capillary 254 (H) 65 - 99 mg/dL   Comment 1 Notify RN    Comment 2 Document in Chart   Basic metabolic panel     Status: Abnormal   Collection Time: 04/23/16  7:34 AM  Result Value Ref Range   Sodium 136 135 - 145 mmol/L   Potassium 3.6 3.5 - 5.1 mmol/L   Chloride 95 (L) 101 - 111 mmol/L   CO2 28 22 - 32 mmol/L   Glucose, Bld 210 (H) 65 - 99 mg/dL   BUN 14 6 - 20 mg/dL   Creatinine, Ser 0.57 0.44 - 1.00 mg/dL   Calcium 9.7 8.9 - 10.3 mg/dL   GFR calc non Af Amer >60 >60 mL/min   GFR calc Af Amer >60 >60 mL/min    Comment: (NOTE) The eGFR has been calculated using the CKD EPI equation. This calculation has not been validated in all clinical situations. eGFR's persistently <60 mL/min signify possible Chronic Kidney Disease.    Anion gap 13 5 - 15  CBC     Status: Abnormal    Collection Time: 04/23/16  7:34 AM  Result Value Ref Range   WBC 8.5 4.0 - 10.5 K/uL   RBC 4.90 3.87 - 5.11 MIL/uL   Hemoglobin 15.4 (H) 12.0 - 15.0 g/dL   HCT 46.1 (H) 36.0 - 46.0 %   MCV 94.1 78.0 - 100.0 fL   MCH 31.4 26.0 - 34.0 pg   MCHC 33.4 30.0 - 36.0 g/dL   RDW 13.0 11.5 - 15.5 %   Platelets 223 150 - 400 K/uL  Glucose, capillary     Status: None   Collection Time: 04/23/16 11:43 AM  Result Value Ref Range   Glucose-Capillary 80 65 - 99 mg/dL   Comment 1 Notify RN    Comment 2 Document in Chart   Glucose, capillary     Status: Abnormal   Collection Time: 04/23/16  4:44 PM  Result Value Ref Range   Glucose-Capillary 146 (H) 65 - 99 mg/dL   Comment 1 Notify RN    Comment 2 Document in Chart   Glucose, capillary     Status: Abnormal   Collection Time: 04/23/16  9:52 PM  Result Value Ref Range   Glucose-Capillary 198 (H) 65 - 99 mg/dL   Comment 1 Notify RN   Glucose, capillary     Status: Abnormal   Collection Time: 04/24/16  6:14 AM  Result Value Ref Range   Glucose-Capillary 222 (  H) 65 - 99 mg/dL   Comment 1 Notify RN    Comment 2 Document in Chart   Basic metabolic panel     Status: Abnormal   Collection Time: 04/24/16  6:23 AM  Result Value Ref Range   Sodium 137 135 - 145 mmol/L   Potassium 3.6 3.5 - 5.1 mmol/L   Chloride 97 (L) 101 - 111 mmol/L   CO2 27 22 - 32 mmol/L   Glucose, Bld 199 (H) 65 - 99 mg/dL   BUN 10 6 - 20 mg/dL   Creatinine, Ser 0.59 0.44 - 1.00 mg/dL   Calcium 9.8 8.9 - 10.3 mg/dL   GFR calc non Af Amer >60 >60 mL/min   GFR calc Af Amer >60 >60 mL/min    Comment: (NOTE) The eGFR has been calculated using the CKD EPI equation. This calculation has not been validated in all clinical situations. eGFR's persistently <60 mL/min signify possible Chronic Kidney Disease.    Anion gap 13 5 - 15  CBC     Status: Abnormal   Collection Time: 04/24/16  6:23 AM  Result Value Ref Range   WBC 13.9 (H) 4.0 - 10.5 K/uL   RBC 5.13 (H) 3.87 - 5.11  MIL/uL   Hemoglobin 16.3 (H) 12.0 - 15.0 g/dL   HCT 47.8 (H) 36.0 - 46.0 %   MCV 93.2 78.0 - 100.0 fL   MCH 31.8 26.0 - 34.0 pg   MCHC 34.1 30.0 - 36.0 g/dL   RDW 12.7 11.5 - 15.5 %   Platelets 268 150 - 400 K/uL  Glucose, capillary     Status: Abnormal   Collection Time: 04/24/16 12:34 PM  Result Value Ref Range   Glucose-Capillary 179 (H) 65 - 99 mg/dL  Glucose, capillary     Status: Abnormal   Collection Time: 04/24/16  4:15 PM  Result Value Ref Range   Glucose-Capillary 179 (H) 65 - 99 mg/dL  Glucose, capillary     Status: Abnormal   Collection Time: 04/24/16  9:57 PM  Result Value Ref Range   Glucose-Capillary 184 (H) 65 - 99 mg/dL   Comment 1 Notify RN    Comment 2 Document in Chart   Glucose, capillary     Status: Abnormal   Collection Time: 04/25/16  7:18 AM  Result Value Ref Range   Glucose-Capillary 173 (H) 65 - 99 mg/dL   Comment 1 Notify RN    Comment 2 Document in Chart   Glucose, capillary     Status: None   Collection Time: 04/25/16 11:28 AM  Result Value Ref Range   Glucose-Capillary 96 65 - 99 mg/dL   Comment 1 Notify RN    Comment 2 Document in Chart   Glucose, capillary     Status: Abnormal   Collection Time: 04/25/16  5:38 PM  Result Value Ref Range   Glucose-Capillary 140 (H) 65 - 99 mg/dL  Glucose, capillary     Status: Abnormal   Collection Time: 04/25/16  9:21 PM  Result Value Ref Range   Glucose-Capillary 282 (H) 65 - 99 mg/dL   Comment 1 Notify RN   Heparin level (unfractionated)     Status: Abnormal   Collection Time: 04/25/16 10:02 PM  Result Value Ref Range   Heparin Unfractionated 0.15 (L) 0.30 - 0.70 IU/mL    Comment:        IF HEPARIN RESULTS ARE BELOW EXPECTED VALUES, AND PATIENT DOSAGE HAS BEEN CONFIRMED, SUGGEST FOLLOW UP TESTING OF ANTITHROMBIN III  LEVELS.   Heparin level (unfractionated)     Status: None   Collection Time: 04/26/16  5:27 AM  Result Value Ref Range   Heparin Unfractionated 0.41 0.30 - 0.70 IU/mL    Comment:         IF HEPARIN RESULTS ARE BELOW EXPECTED VALUES, AND PATIENT DOSAGE HAS BEEN CONFIRMED, SUGGEST FOLLOW UP TESTING OF ANTITHROMBIN III LEVELS.   CBC     Status: None   Collection Time: 04/26/16  5:27 AM  Result Value Ref Range   WBC 9.8 4.0 - 10.5 K/uL   RBC 4.71 3.87 - 5.11 MIL/uL   Hemoglobin 14.7 12.0 - 15.0 g/dL   HCT 45.1 36.0 - 46.0 %   MCV 95.8 78.0 - 100.0 fL   MCH 31.2 26.0 - 34.0 pg   MCHC 32.6 30.0 - 36.0 g/dL   RDW 13.0 11.5 - 15.5 %   Platelets 232 150 - 400 K/uL  Glucose, capillary     Status: Abnormal   Collection Time: 04/26/16  8:30 AM  Result Value Ref Range   Glucose-Capillary 160 (H) 65 - 99 mg/dL   Comment 1 Notify RN    Comment 2 Document in Chart   Glucose, capillary     Status: Abnormal   Collection Time: 04/26/16 11:48 AM  Result Value Ref Range   Glucose-Capillary 350 (H) 65 - 99 mg/dL   Comment 1 Notify RN    Comment 2 Document in Chart   Heparin level (unfractionated)     Status: None   Collection Time: 04/26/16 12:10 PM  Result Value Ref Range   Heparin Unfractionated 0.34 0.30 - 0.70 IU/mL    Comment:        IF HEPARIN RESULTS ARE BELOW EXPECTED VALUES, AND PATIENT DOSAGE HAS BEEN CONFIRMED, SUGGEST FOLLOW UP TESTING OF ANTITHROMBIN III LEVELS.   Glucose, capillary     Status: Abnormal   Collection Time: 04/26/16  3:44 PM  Result Value Ref Range   Glucose-Capillary 156 (H) 65 - 99 mg/dL   Comment 1 Document in Chart   Glucose, capillary     Status: Abnormal   Collection Time: 04/26/16  7:46 PM  Result Value Ref Range   Glucose-Capillary 206 (H) 65 - 99 mg/dL   Comment 1 Notify RN   Glucose, capillary     Status: Abnormal   Collection Time: 04/26/16  8:50 PM  Result Value Ref Range   Glucose-Capillary 158 (H) 65 - 99 mg/dL   Comment 1 Notify RN   Heparin level (unfractionated)     Status: None   Collection Time: 04/27/16  2:46 AM  Result Value Ref Range   Heparin Unfractionated 0.34 0.30 - 0.70 IU/mL    Comment:        IF  HEPARIN RESULTS ARE BELOW EXPECTED VALUES, AND PATIENT DOSAGE HAS BEEN CONFIRMED, SUGGEST FOLLOW UP TESTING OF ANTITHROMBIN III LEVELS.   CBC     Status: None   Collection Time: 04/27/16  2:46 AM  Result Value Ref Range   WBC 10.1 4.0 - 10.5 K/uL   RBC 4.60 3.87 - 5.11 MIL/uL   Hemoglobin 14.5 12.0 - 15.0 g/dL   HCT 43.8 36.0 - 46.0 %   MCV 95.2 78.0 - 100.0 fL   MCH 31.5 26.0 - 34.0 pg   MCHC 33.1 30.0 - 36.0 g/dL   RDW 12.9 11.5 - 15.5 %   Platelets 227 150 - 400 K/uL  Basic metabolic panel     Status: Abnormal  Collection Time: 04/27/16  2:46 AM  Result Value Ref Range   Sodium 137 135 - 145 mmol/L   Potassium 3.6 3.5 - 5.1 mmol/L   Chloride 101 101 - 111 mmol/L   CO2 26 22 - 32 mmol/L   Glucose, Bld 157 (H) 65 - 99 mg/dL   BUN 8 6 - 20 mg/dL   Creatinine, Ser 0.61 0.44 - 1.00 mg/dL   Calcium 9.6 8.9 - 10.3 mg/dL   GFR calc non Af Amer >60 >60 mL/min   GFR calc Af Amer >60 >60 mL/min    Comment: (NOTE) The eGFR has been calculated using the CKD EPI equation. This calculation has not been validated in all clinical situations. eGFR's persistently <60 mL/min signify possible Chronic Kidney Disease.    Anion gap 10 5 - 15  Glucose, capillary     Status: Abnormal   Collection Time: 04/27/16  8:23 AM  Result Value Ref Range   Glucose-Capillary 180 (H) 65 - 99 mg/dL   Comment 1 Notify RN    Comment 2 Document in Chart   Glucose, capillary     Status: Abnormal   Collection Time: 04/27/16 12:18 PM  Result Value Ref Range   Glucose-Capillary 157 (H) 65 - 99 mg/dL   Comment 1 Notify RN    Comment 2 Document in Chart   Ammonia     Status: None   Collection Time: 04/27/16  1:05 PM  Result Value Ref Range   Ammonia 20 9 - 35 umol/L  Glucose, capillary     Status: Abnormal   Collection Time: 04/27/16  4:33 PM  Result Value Ref Range   Glucose-Capillary 136 (H) 65 - 99 mg/dL   Comment 1 Notify RN    Comment 2 Document in Chart   Glucose, capillary     Status: Abnormal     Collection Time: 04/27/16  7:41 PM  Result Value Ref Range   Glucose-Capillary 106 (H) 65 - 99 mg/dL   Comment 1 Notify RN   Glucose, capillary     Status: Abnormal   Collection Time: 04/27/16 11:37 PM  Result Value Ref Range   Glucose-Capillary 112 (H) 65 - 99 mg/dL   Comment 1 Notify RN   CBC     Status: None   Collection Time: 04/28/16  2:36 AM  Result Value Ref Range   WBC 8.3 4.0 - 10.5 K/uL   RBC 4.76 3.87 - 5.11 MIL/uL   Hemoglobin 14.9 12.0 - 15.0 g/dL   HCT 45.5 36.0 - 46.0 %   MCV 95.6 78.0 - 100.0 fL   MCH 31.3 26.0 - 34.0 pg   MCHC 32.7 30.0 - 36.0 g/dL   RDW 12.8 11.5 - 15.5 %   Platelets 236 150 - 400 K/uL  Comprehensive metabolic panel     Status: Abnormal   Collection Time: 04/28/16  2:36 AM  Result Value Ref Range   Sodium 140 135 - 145 mmol/L   Potassium 3.2 (L) 3.5 - 5.1 mmol/L   Chloride 102 101 - 111 mmol/L   CO2 25 22 - 32 mmol/L   Glucose, Bld 114 (H) 65 - 99 mg/dL   BUN 6 6 - 20 mg/dL   Creatinine, Ser 0.55 0.44 - 1.00 mg/dL   Calcium 9.9 8.9 - 10.3 mg/dL   Total Protein 6.9 6.5 - 8.1 g/dL   Albumin 3.7 3.5 - 5.0 g/dL   AST 23 15 - 41 U/L   ALT 37 14 - 54 U/L  Alkaline Phosphatase 73 38 - 126 U/L   Total Bilirubin 0.4 0.3 - 1.2 mg/dL   GFR calc non Af Amer >60 >60 mL/min   GFR calc Af Amer >60 >60 mL/min    Comment: (NOTE) The eGFR has been calculated using the CKD EPI equation. This calculation has not been validated in all clinical situations. eGFR's persistently <60 mL/min signify possible Chronic Kidney Disease.    Anion gap 13 5 - 15  Glucose, capillary     Status: Abnormal   Collection Time: 04/28/16  3:33 AM  Result Value Ref Range   Glucose-Capillary 141 (H) 65 - 99 mg/dL   Comment 1 Notify RN   Heparin level (unfractionated)     Status: None   Collection Time: 04/28/16  4:29 AM  Result Value Ref Range   Heparin Unfractionated 0.39 0.30 - 0.70 IU/mL    Comment:        IF HEPARIN RESULTS ARE BELOW EXPECTED VALUES, AND  PATIENT DOSAGE HAS BEEN CONFIRMED, SUGGEST FOLLOW UP TESTING OF ANTITHROMBIN III LEVELS.   Glucose, capillary     Status: Abnormal   Collection Time: 04/28/16  8:18 AM  Result Value Ref Range   Glucose-Capillary 163 (H) 65 - 99 mg/dL   Comment 1 Notify RN    Comment 2 Document in Chart   Glucose, capillary     Status: Abnormal   Collection Time: 04/28/16 12:20 PM  Result Value Ref Range   Glucose-Capillary 156 (H) 65 - 99 mg/dL   Comment 1 Notify RN    Comment 2 Document in Chart   Glucose, capillary     Status: None   Collection Time: 04/28/16  4:17 PM  Result Value Ref Range   Glucose-Capillary 86 65 - 99 mg/dL   Comment 1 Notify RN    Comment 2 Document in Chart   Protime-INR     Status: None   Collection Time: 04/28/16  6:40 PM  Result Value Ref Range   Prothrombin Time 13.3 11.4 - 15.2 seconds   INR 1.01   Glucose, capillary     Status: Abnormal   Collection Time: 04/28/16  8:44 PM  Result Value Ref Range   Glucose-Capillary 119 (H) 65 - 99 mg/dL   Comment 1 Notify RN   Glucose, capillary     Status: Abnormal   Collection Time: 04/28/16 11:51 PM  Result Value Ref Range   Glucose-Capillary 117 (H) 65 - 99 mg/dL   Comment 1 Notify RN   Heparin level (unfractionated)     Status: None   Collection Time: 04/29/16  3:40 AM  Result Value Ref Range   Heparin Unfractionated 0.42 0.30 - 0.70 IU/mL    Comment:        IF HEPARIN RESULTS ARE BELOW EXPECTED VALUES, AND PATIENT DOSAGE HAS BEEN CONFIRMED, SUGGEST FOLLOW UP TESTING OF ANTITHROMBIN III LEVELS.   Protime-INR     Status: None   Collection Time: 04/29/16  3:40 AM  Result Value Ref Range   Prothrombin Time 13.3 11.4 - 15.2 seconds   INR 1.01   Glucose, capillary     Status: Abnormal   Collection Time: 04/29/16  4:13 AM  Result Value Ref Range   Glucose-Capillary 125 (H) 65 - 99 mg/dL   Comment 1 Notify RN   Glucose, capillary     Status: Abnormal   Collection Time: 04/29/16  8:18 AM  Result Value Ref Range    Glucose-Capillary 159 (H) 65 - 99 mg/dL  Comment 1 Document in Chart   Glucose, capillary     Status: Abnormal   Collection Time: 04/29/16 11:42 AM  Result Value Ref Range   Glucose-Capillary 147 (H) 65 - 99 mg/dL   Comment 1 Notify RN    Comment 2 Document in Chart   Glucose, capillary     Status: Abnormal   Collection Time: 04/29/16  3:57 PM  Result Value Ref Range   Glucose-Capillary 167 (H) 65 - 99 mg/dL   Comment 1 Document in Chart   Glucose, capillary     Status: Abnormal   Collection Time: 04/29/16 10:03 PM  Result Value Ref Range   Glucose-Capillary 124 (H) 65 - 99 mg/dL   Comment 1 Notify RN    Comment 2 Document in Chart   Heparin level (unfractionated)     Status: None   Collection Time: 04/30/16  6:31 AM  Result Value Ref Range   Heparin Unfractionated 0.45 0.30 - 0.70 IU/mL    Comment:        IF HEPARIN RESULTS ARE BELOW EXPECTED VALUES, AND PATIENT DOSAGE HAS BEEN CONFIRMED, SUGGEST FOLLOW UP TESTING OF ANTITHROMBIN III LEVELS.   Basic metabolic panel     Status: Abnormal   Collection Time: 04/30/16  6:31 AM  Result Value Ref Range   Sodium 140 135 - 145 mmol/L   Potassium 5.9 (H) 3.5 - 5.1 mmol/L   Chloride 104 101 - 111 mmol/L   CO2 25 22 - 32 mmol/L   Glucose, Bld 151 (H) 65 - 99 mg/dL   BUN 11 6 - 20 mg/dL   Creatinine, Ser 0.76 0.44 - 1.00 mg/dL   Calcium 10.5 (H) 8.9 - 10.3 mg/dL   GFR calc non Af Amer >60 >60 mL/min   GFR calc Af Amer >60 >60 mL/min    Comment: (NOTE) The eGFR has been calculated using the CKD EPI equation. This calculation has not been validated in all clinical situations. eGFR's persistently <60 mL/min signify possible Chronic Kidney Disease.    Anion gap 11 5 - 15  CBC     Status: Abnormal   Collection Time: 04/30/16  6:31 AM  Result Value Ref Range   WBC 9.3 4.0 - 10.5 K/uL   RBC 4.89 3.87 - 5.11 MIL/uL   Hemoglobin 15.4 (H) 12.0 - 15.0 g/dL   HCT 47.2 (H) 36.0 - 46.0 %   MCV 96.5 78.0 - 100.0 fL   MCH 31.5 26.0  - 34.0 pg   MCHC 32.6 30.0 - 36.0 g/dL   RDW 13.2 11.5 - 15.5 %   Platelets 239 150 - 400 K/uL  Protime-INR     Status: Abnormal   Collection Time: 04/30/16  6:31 AM  Result Value Ref Range   Prothrombin Time 20.5 (H) 11.4 - 15.2 seconds   INR 1.73   Glucose, capillary     Status: Abnormal   Collection Time: 04/30/16  7:48 AM  Result Value Ref Range   Glucose-Capillary 150 (H) 65 - 99 mg/dL   Comment 1 Notify RN    Comment 2 Document in Chart     Radiology US Venous Img Upper Uni Left  Result Date: 05/28/2016 CLINICAL DATA:  Left arm pain for 1 month. Knot in the medial left elbow area. EXAM: LEFT UPPER EXTREMITY VENOUS DOPPLER ULTRASOUND TECHNIQUE: Gray-scale sonography with graded compression, as well as color Doppler and duplex ultrasound were performed to evaluate the upper extremity deep venous system from the level of the subclavian vein  and including the jugular, axillary, basilic and upper cephalic vein. Spectral Doppler was utilized to evaluate flow at rest and with distal augmentation maneuvers. COMPARISON:  None. FINDINGS: Right subclavian vein is patent. Normal compressibility, color Doppler flow and phasicity in the left internal jugular vein. Left subclavian vein is patent. Normal compression, color Doppler flow and augmentation in the left axillary vein. Normal compressibility, color Doppler flow and augmentation in the left cephalic vein. Normal compressibility, color Doppler flow and augmentation in the left brachial veins. Radial and ulnar veins are patent. Superficial venous thrombosis involving the left basilic vein. The left basilic vein thrombosis extends throughout the left forearm and terminates near elbow. Upper arm left basilic vein appears to be in patent with normal compressibility. Thrombosed left basilic vein is quite large measuring up to 1.3 cm. This thrombosed vein appears to account for the palpable knot near the elbow. IMPRESSION: Superficial venous thrombosis  involving the left basilic vein. The thrombus extends throughout the left forearm and terminates near the elbow. Segment of the thrombosed vein is quite large and accounts for the palpable abnormality. No deep venous thrombosis in the left upper extremity. Electronically Signed   By: Markus Daft M.D.   On: 05/28/2016 09:56    Assessment/Plan  1. Superficial venous thrombosis of arm, left The patient has demonstrated significant propagation of her superficial thrombophlebitis. She has now reached a point that the deep venous system has potential to become affected.  Given her intracranial hemorrhage she is not a candidate for anticoagulation as has been previously discussed. At this time the 2 possible courses of action are continued observation or surgical ligation and division of the basilic vein in an attempt to prevent further propagation. The risks benefits as well as alternatives have been discussed in detail. All questions have been answered. The patient has elected to proceed with ligation of the basilic vein.  2. Essential hypertension, malignant Antihypertensive medications are reviewed, no changes are made at this time  3. Chronic hepatitis C without hepatic coma (HCC) Most recent hepatic profile is acceptable and we will proceed with surgery  4. Nontraumatic intracerebral hemorrhage, unspecified cerebral location, unspecified laterality [facility]) Given this recent occurrence she is not at candidate for anticoagulation     Hortencia Pilar, MD  06/20/2016 12:47 PM    This note was created with Dragon medical transcription system.  Any errors from dictation are purely unintentional

## 2016-06-21 ENCOUNTER — Encounter
Admission: RE | Disposition: A | Discharge status: Home / Self Care | Payer: Self-pay | Source: Ambulatory Visit | Attending: Vascular Surgery

## 2016-06-21 ENCOUNTER — Ambulatory Visit: Payer: 59 | Admitting: Anesthesiology

## 2016-06-21 ENCOUNTER — Encounter: Payer: Self-pay | Admitting: *Deleted

## 2016-06-21 ENCOUNTER — Ambulatory Visit
Admission: RE | Admit: 2016-06-21 | Discharge: 2016-06-21 | Disposition: A | Discharge status: Home / Self Care | Payer: 59 | Source: Ambulatory Visit | Attending: Vascular Surgery | Admitting: Vascular Surgery

## 2016-06-21 DIAGNOSIS — I10 Essential (primary) hypertension: Secondary | ICD-10-CM | POA: Insufficient documentation

## 2016-06-21 DIAGNOSIS — F17200 Nicotine dependence, unspecified, uncomplicated: Secondary | ICD-10-CM | POA: Insufficient documentation

## 2016-06-21 DIAGNOSIS — I82612 Acute embolism and thrombosis of superficial veins of left upper extremity: Secondary | ICD-10-CM | POA: Insufficient documentation

## 2016-06-21 DIAGNOSIS — Z8619 Personal history of other infectious and parasitic diseases: Secondary | ICD-10-CM | POA: Insufficient documentation

## 2016-06-21 DIAGNOSIS — I82290 Acute embolism and thrombosis of other thoracic veins: Secondary | ICD-10-CM | POA: Diagnosis not present

## 2016-06-21 DIAGNOSIS — E119 Type 2 diabetes mellitus without complications: Secondary | ICD-10-CM | POA: Insufficient documentation

## 2016-06-21 HISTORY — PX: LIGATION OF ARTERIOVENOUS  FISTULA: SHX5948

## 2016-06-21 HISTORY — DX: Type 2 diabetes mellitus without complications: E11.9

## 2016-06-21 LAB — CBC WITH DIFFERENTIAL/PLATELET
Basophils Absolute: 0.1 10*3/uL (ref 0–0.1)
Basophils Relative: 1 %
Eosinophils Absolute: 0.1 10*3/uL (ref 0–0.7)
Eosinophils Relative: 2 %
HEMATOCRIT: 44.2 % (ref 35.0–47.0)
HEMOGLOBIN: 15.4 g/dL (ref 12.0–16.0)
LYMPHS ABS: 3.1 10*3/uL (ref 1.0–3.6)
LYMPHS PCT: 44 %
MCH: 32.8 pg (ref 26.0–34.0)
MCHC: 34.9 g/dL (ref 32.0–36.0)
MCV: 94 fL (ref 80.0–100.0)
MONOS PCT: 8 %
Monocytes Absolute: 0.6 10*3/uL (ref 0.2–0.9)
NEUTROS ABS: 3.2 10*3/uL (ref 1.4–6.5)
NEUTROS PCT: 45 %
Platelets: 162 10*3/uL (ref 150–440)
RBC: 4.7 MIL/uL (ref 3.80–5.20)
RDW: 14.5 % (ref 11.5–14.5)
WBC: 7 10*3/uL (ref 3.6–11.0)

## 2016-06-21 LAB — APTT: aPTT: 30 seconds (ref 24–36)

## 2016-06-21 LAB — BASIC METABOLIC PANEL
ANION GAP: 8 (ref 5–15)
BUN: 12 mg/dL (ref 6–20)
CHLORIDE: 107 mmol/L (ref 101–111)
CO2: 25 mmol/L (ref 22–32)
Calcium: 9.6 mg/dL (ref 8.9–10.3)
Creatinine, Ser: 0.48 mg/dL (ref 0.44–1.00)
GFR calc non Af Amer: >60 mL/min (ref >60)
Glucose, Bld: 106 mg/dL — ABNORMAL HIGH (ref 65–99)
Potassium: 4 mmol/L (ref 3.5–5.1)
Sodium: 140 mmol/L (ref 135–145)

## 2016-06-21 LAB — ABO/RH: ABO/RH(D): A POS

## 2016-06-21 LAB — TYPE AND SCREEN
ABO/RH(D): A POS
Antibody Screen: NEGATIVE

## 2016-06-21 LAB — PROTIME-INR
INR: 1
Prothrombin Time: 13.2 seconds (ref 11.4–15.2)

## 2016-06-21 LAB — GLUCOSE, CAPILLARY
GLUCOSE-CAPILLARY: 110 mg/dL — AB (ref 65–99)
GLUCOSE-CAPILLARY: 103 mg/dL — AB (ref 65–99)

## 2016-06-21 SURGERY — LIGATION OF ARTERIOVENOUS  FISTULA
Anesthesia: General | Site: Arm Upper | Laterality: Left | Wound class: Clean

## 2016-06-21 MED ORDER — SODIUM CHLORIDE 0.9 % IV SOLN
INTRAVENOUS | Status: DC | PRN
  Administered 2016-06-21: 14:00:00 via INTRAVENOUS

## 2016-06-21 MED ORDER — BUPIVACAINE HCL (PF) 0.5 % IJ SOLN
INTRAMUSCULAR | Status: DC | PRN
  Administered 2016-06-21: 20 mL

## 2016-06-21 MED ORDER — OXYCODONE-ACETAMINOPHEN 5-325 MG PO TABS: 1.0000 | ORAL_TABLET | Freq: Four times a day (QID) | ORAL | 0 refills | Status: DC | PRN

## 2016-06-21 MED ORDER — HEPARIN SODIUM (PORCINE) 5000 UNIT/ML IJ SOLN
INTRAMUSCULAR | Status: AC
  Filled 2016-06-21: qty 1

## 2016-06-21 MED ORDER — ONDANSETRON HCL 4 MG/2ML IJ SOLN
INTRAMUSCULAR | Status: DC | PRN
  Administered 2016-06-21: 4 mg via INTRAVENOUS

## 2016-06-21 MED ORDER — GLYCOPYRROLATE 0.2 MG/ML IJ SOLN
INTRAMUSCULAR | Status: DC | PRN
  Administered 2016-06-21 (×3): 0.2 mg via INTRAVENOUS

## 2016-06-21 MED ORDER — MIDAZOLAM HCL 2 MG/2ML IJ SOLN
INTRAMUSCULAR | Status: DC | PRN
  Administered 2016-06-21: 2 mg via INTRAVENOUS

## 2016-06-21 MED ORDER — FENTANYL CITRATE (PF) 100 MCG/2ML IJ SOLN
INTRAMUSCULAR | Status: DC | PRN
  Administered 2016-06-21: 100 ug via INTRAVENOUS

## 2016-06-21 MED ORDER — PHENYLEPHRINE HCL 10 MG/ML IJ SOLN
INTRAMUSCULAR | Status: DC | PRN
  Administered 2016-06-21 (×4): 100 ug via INTRAVENOUS

## 2016-06-21 MED ORDER — CHLORHEXIDINE GLUCONATE CLOTH 2 % EX PADS: 6.0000 | MEDICATED_PAD | Freq: Once | CUTANEOUS | Status: DC

## 2016-06-21 MED ORDER — FENTANYL CITRATE (PF) 100 MCG/2ML IJ SOLN
25.0000 ug | INTRAMUSCULAR | Status: DC | PRN
  Administered 2016-06-21 (×2): 50 ug via INTRAVENOUS

## 2016-06-21 MED ORDER — EPHEDRINE SULFATE 50 MG/ML IJ SOLN
INTRAMUSCULAR | Status: DC | PRN
  Administered 2016-06-21 (×4): 10 mg via INTRAVENOUS

## 2016-06-21 MED ORDER — CLINDAMYCIN PHOSPHATE 300 MG/50ML IV SOLN
INTRAVENOUS | Status: AC
  Filled 2016-06-21: qty 50

## 2016-06-21 MED ORDER — FENTANYL CITRATE (PF) 100 MCG/2ML IJ SOLN
INTRAMUSCULAR | Status: AC
Start: 2016-06-21 — End: 2016-06-21
  Administered 2016-06-21: 50 ug via INTRAVENOUS
  Filled 2016-06-21: qty 2

## 2016-06-21 MED ORDER — OXYCODONE HCL 5 MG PO TABS
ORAL_TABLET | ORAL | Status: DC
Start: 2016-06-21 — End: 2016-06-21
  Filled 2016-06-21: qty 1

## 2016-06-21 MED ORDER — PROPOFOL 10 MG/ML IV BOLUS
INTRAVENOUS | Status: DC | PRN
  Administered 2016-06-21: 150 mg via INTRAVENOUS
  Administered 2016-06-21: 100 mg via INTRAVENOUS
  Administered 2016-06-21: 50 mg via INTRAVENOUS

## 2016-06-21 MED ORDER — CLINDAMYCIN PHOSPHATE 300 MG/50ML IV SOLN
300.0000 mg | Freq: Once | INTRAVENOUS | Status: AC
  Administered 2016-06-21: 300 mg via INTRAVENOUS

## 2016-06-21 MED ORDER — METOPROLOL TARTRATE 5 MG/5ML IV SOLN
INTRAVENOUS | Status: DC | PRN
  Administered 2016-06-21 (×2): 2.5 mg via INTRAVENOUS

## 2016-06-21 MED ORDER — LIDOCAINE HCL (CARDIAC) 20 MG/ML IV SOLN
INTRAVENOUS | Status: DC | PRN
  Administered 2016-06-21: 80 mg via INTRAVENOUS

## 2016-06-21 MED ORDER — OXYCODONE HCL 5 MG/5ML PO SOLN: 5.0000 mg | Freq: Once | ORAL | Status: AC | PRN

## 2016-06-21 MED ORDER — BUPIVACAINE HCL (PF) 0.5 % IJ SOLN
INTRAMUSCULAR | Status: AC
  Filled 2016-06-21: qty 30

## 2016-06-21 MED ORDER — OXYCODONE HCL 5 MG PO TABS
5.0000 mg | ORAL_TABLET | Freq: Once | ORAL | Status: AC | PRN
  Administered 2016-06-21: 5 mg via ORAL

## 2016-06-21 SURGICAL SUPPLY — 45 items
BAG DECANTER FOR FLEXI CONT (MISCELLANEOUS) ×2 IMPLANT
BLADE SURG SZ11 CARB STEEL (BLADE) ×2 IMPLANT
BOOT SUTURE AID YELLOW STND (SUTURE) IMPLANT
CANISTER SUCT 1200ML W/VALVE (MISCELLANEOUS) ×2 IMPLANT
CHLORAPREP W/TINT 26ML (MISCELLANEOUS) ×2 IMPLANT
CLIP TI MEDIUM 6 (CLIP) IMPLANT
CLIP TI WIDE RED SMALL 6 (CLIP) IMPLANT
DRESSING SURGICEL FIBRLLR 1X2 (HEMOSTASIS) ×1 IMPLANT
DRSG SURGICEL FIBRILLAR 1X2 (HEMOSTASIS) ×2
ELECT CAUTERY BLADE 6.4 (BLADE) ×2 IMPLANT
ELECT REM PT RETURN 9FT ADLT (ELECTROSURGICAL) ×2
ELECTRODE REM PT RTRN 9FT ADLT (ELECTROSURGICAL) ×1 IMPLANT
GLOVE SURG SYN 8.0 (GLOVE) ×2 IMPLANT
GOWN STRL REUS W/ TWL LRG LVL3 (GOWN DISPOSABLE) ×1 IMPLANT
GOWN STRL REUS W/ TWL XL LVL3 (GOWN DISPOSABLE) ×1 IMPLANT
GOWN STRL REUS W/TWL LRG LVL3 (GOWN DISPOSABLE) ×1
GOWN STRL REUS W/TWL XL LVL3 (GOWN DISPOSABLE) ×2
IV NS 500ML (IV SOLUTION) ×2
IV NS 500ML BAXH (IV SOLUTION) ×1 IMPLANT
KIT RM TURNOVER STRD PROC AR (KITS) ×2 IMPLANT
LABEL OR SOLS (LABEL) ×2 IMPLANT
LIQUID BAND (GAUZE/BANDAGES/DRESSINGS) ×2 IMPLANT
LOOP RED MAXI  1X406MM (MISCELLANEOUS)
LOOP VESSEL MAXI 1X406 RED (MISCELLANEOUS) IMPLANT
LOOP VESSEL MINI 0.8X406 BLUE (MISCELLANEOUS) IMPLANT
LOOPS BLUE MINI 0.8X406MM (MISCELLANEOUS)
NEEDLE FILTER BLUNT 18X 1/2SAF (NEEDLE) ×1
NEEDLE FILTER BLUNT 18X1 1/2 (NEEDLE) ×1 IMPLANT
PACK EXTREMITY ARMC (MISCELLANEOUS) ×2 IMPLANT
PAD PREP 24X41 OB/GYN DISP (PERSONAL CARE ITEMS) ×2 IMPLANT
STOCKINETTE STRL 4IN 9604848 (GAUZE/BANDAGES/DRESSINGS) ×2 IMPLANT
SUT ETHIBOND 0 36 GRN (SUTURE) IMPLANT
SUT MNCRL+ 5-0 UNDYED PC-3 (SUTURE) ×1 IMPLANT
SUT MONOCRYL 5-0 (SUTURE) ×1
SUT PROLENE 6 0 BV (SUTURE) IMPLANT
SUT SILK 2 0 (SUTURE)
SUT SILK 2-0 18XBRD TIE 12 (SUTURE) IMPLANT
SUT SILK 3 0 (SUTURE)
SUT SILK 3-0 18XBRD TIE 12 (SUTURE) IMPLANT
SUT SILK 4 0 (SUTURE)
SUT SILK 4-0 18XBRD TIE 12 (SUTURE) IMPLANT
SUT VIC AB 3-0 SH 27 (SUTURE) ×1
SUT VIC AB 3-0 SH 27X BRD (SUTURE) ×1 IMPLANT
SYR 20CC LL (SYRINGE) ×2 IMPLANT
SYR 3ML LL SCALE MARK (SYRINGE) ×2 IMPLANT

## 2016-06-21 NOTE — Anesthesia Postprocedure Evaluation (Signed)
Anesthesia Post Note  Patient: Stephanie [name]  Procedure(s) Performed: Procedure(s) (LRB): LIGATION OF ARTERIOVENOUS  FISTULA ( LIGATION BASILIC VEIN ) (Left)  Patient location during evaluation: PACU Anesthesia Type: General Level of consciousness: awake and alert Pain management: pain level controlled Vital Signs Assessment: post-procedure vital signs reviewed and stable Respiratory status: spontaneous breathing, nonlabored ventilation, respiratory function stable and patient connected to nasal cannula oxygen Cardiovascular status: blood pressure returned to baseline and stable Postop Assessment: no signs of nausea or vomiting Anesthetic complications: no    Last Vitals:  Vitals:   06/21/16 1632 06/21/16 1641  BP: 121/72 107/68  Pulse: 80 77  Resp: 16 16  Temp: 36.3 C     Last Pain:  Vitals:   06/21/16 1632  TempSrc: Temporal  PainSc: 7                  Janda Cargo S

## 2016-06-21 NOTE — Anesthesia Preprocedure Evaluation (Addendum)
Anesthesia Evaluation  Patient identified by MRN, date of birth, ID band Patient awake    Reviewed: Allergy & Precautions, H&P , NPO status , Patient's Chart, lab work & pertinent test results  History of Anesthesia Complications Negative for: history of anesthetic complications  Airway Mallampati: III  TM Distance: >3 FB Neck ROM: full    Dental  (+) Poor Dentition   Pulmonary neg shortness of breath, Current Smoker,    Pulmonary exam normal breath sounds clear to auscultation       Cardiovascular Exercise Tolerance: Good hypertension, (-) angina(-) Past MI and (-) DOE Normal cardiovascular exam Rhythm:regular Rate:Normal     Neuro/Psych  Neuromuscular disease negative psych ROS   GI/Hepatic negative GI ROS, (+) Hepatitis -  Endo/Other  negative endocrine ROSdiabetes  Renal/GU Renal disease     Musculoskeletal   Abdominal   Peds  Hematology negative hematology ROS (+)   Anesthesia Other Findings Patient reports no problems with oxycodone for fentanyl.   Past Medical History: No date: Diabetes mellitus without complication (HCC) No date: Diverticulitis No date: Hepatitis C No date: Hypertension No date: Kidney stones No date: Sciatica  Past Surgical History: No date: ABDOMINAL HYSTERECTOMY 02/12/12: KIDNEY STONE SURGERY Right 07/15/2012: KIDNEY STONE SURGERY Left No date: LITHOTRIPSY No date: OOPHORECTOMY No date: SINUS EXPLORATION     Reproductive/Obstetrics negative OB ROS                            Anesthesia Physical Anesthesia Plan  ASA: III  Anesthesia Plan: General LMA   Post-op Pain Management:    Induction:   Airway Management Planned:   Additional Equipment:   Intra-op Plan:   Post-operative Plan:   Informed Consent: I have reviewed the patients History and Physical, chart, labs and discussed the procedure including the risks, benefits and alternatives  for the proposed anesthesia with the patient or authorized representative who has indicated his/her understanding and acceptance.   Dental Advisory Given  Plan Discussed with: Anesthesiologist, CRNA and Surgeon  Anesthesia Plan Comments:         Anesthesia Quick Evaluation

## 2016-06-21 NOTE — Anesthesia Procedure Notes (Signed)
Procedure Name: LMA Insertion Date/Time: 06/21/2016 2:10 PM Performed by: Pearla DubonnetHUANG, Jadene Stemmer Pre-anesthesia Checklist: Patient identified, Emergency Drugs available, Suction available, Patient being monitored and Timeout performed Patient Re-evaluated:Patient Re-evaluated prior to inductionOxygen Delivery Method: Circle system utilized Preoxygenation: Pre-oxygenation with 100% oxygen Intubation Type: IV induction LMA: LMA inserted LMA Size: 4.0 Placement Confirmation: positive ETCO2 and breath sounds checked- equal and bilateral

## 2016-06-21 NOTE — Discharge Instructions (Signed)
°  AV Fistula, Care After Refer to this sheet in the next few weeks. These instructions provide you with information on caring for yourself after your procedure. Your caregiver may also give you more specific instructions. Your treatment has been planned according to current medical practices, but problems sometimes occur. Call your caregiver if you have any problems or questions after your procedure. HOME CARE INSTRUCTIONS   Do not drive a car or take public transportation alone.  Do not drink alcohol.  Only take medicine that has been prescribed by your caregiver.  Do not sign important papers or make important decisions.  Eat a light meal today to avoid nausea and vomiting; nothing greasy or spicy. You may resume your regular diet tomorrow.  Have a responsible person with you.  Ask your caregiver to show you how to check your access at home for a vibration (called a "thrill") or for a sound (called a "bruit" pronounced brew-ee).  Your vein will need time to enlarge and mature so needles can be inserted for dialysis. Follow your caregiver's instructions about what you need to do to make this happen.  Keep dressings clean and dry.  Keep the arm elevated above your heart. Use a pillow.  Rest.  Use the arm as usual for all activities.  Have the stitches or tape closures removed in 10 to 14 days, or as directed by your caregiver.  Do not sleep or lie on the area of the fistula or that arm. This may decrease or stop the blood flow through your fistula.  Do not allow blood pressures to be taken on this arm.  Do not allow blood drawing to be done from the graft.  Do not wear tight clothing around the access site or on the arm.  Avoid lifting heavy objects with the arm that has the fistula.  Do not use creams or lotions over the access site. SEEK MEDICAL CARE IF:   You have a fever.  You have swelling around the fistula that gets worse, or you have new pain.  You have unusual  bleeding at the fistula site or from any other area.  You have pus or other drainage at the fistula site.  You have skin redness or red streaking on the skin around, above, or below the fistula site.  Your access site feels warm.  You have any flu-like symptoms. SEEK IMMEDIATE MEDICAL CARE IF:   You have pain, numbness, or an unusual pale skin on the hand or on the side of your fistula.  You have dizziness or weakness that you have not had before.  You have shortness of breath.  You have chest pain.  Your fistula disconnects or breaks, and there is bleeding that cannot be easily controlled. Call for local emergency medical help. Do not try to drive yourself to the hospital. MAKE SURE YOU  Understand these instructions.  Will watch your condition.  Will get help right away if you are not doing well or get worse.   This information is not intended to replace advice given to you by your health care provider. Make sure you discuss any questions you have with your health care provider.   Document Released: 08/26/2005 Document Revised: 09/16/2014 Document Reviewed: 02/13/2011 Elsevier Interactive Patient Education [employer]2016 [employer] Inc.

## 2016-06-21 NOTE — H&P (Signed)
[facility] History & Physical Update  The patient was interviewed and re-examined.  The patient's previous History and Physical has been reviewed and is unchanged.  There is no change in the plan of care. We plan to proceed with the scheduled procedure.  Levora DredgeGregory Yadiel Aubry, MD  06/21/2016, 12:58 PM

## 2016-06-21 NOTE — Transfer of Care (Signed)
Immediate Anesthesia Transfer of Care Note  Patient: Stephanie [name]  Procedure(s) Performed: Procedure(s): LIGATION OF ARTERIOVENOUS  FISTULA ( LIGATION BASILIC VEIN ) (Left)  Patient Location: PACU  Anesthesia Type:General  Level of Consciousness: awake, alert  and oriented  Airway & Oxygen Therapy: Patient Spontanous Breathing  Post-op Assessment: Report given to RN and Post -op Vital signs reviewed and stable  Post vital signs: Reviewed and stable  Last Vitals:  Vitals:   06/21/16 1215 06/21/16 1541  BP: 110/84 114/66  Pulse: 74 88  Resp: 18 15  Temp: 36.7 C 36.3 C    Last Pain:  Vitals:   06/21/16 1541  TempSrc: Tympanic  PainSc:       Patients Stated Pain Goal: 3 (06/21/16 1215)  Complications: No apparent anesthesia complications

## 2016-06-21 NOTE — Op Note (Signed)
    OPERATIVE NOTE   PROCEDURE: Ligation of the left basilic vein at the confluence with the axillary vein  PRE-OPERATIVE DIAGNOSIS: Thrombosis left basilic vein with propagation; intracranial hemorrhage.  POST-OPERATIVE DIAGNOSIS: Same  SURGEON: Levora DredgeGregory Texanna Hilburn  ASSISTANT(S): Ms. Raul DelKim Stegmayer  ANESTHESIA: general  ESTIMATED BLOOD LOSS: Less than 20 cc  FINDING(S): Basilic vein confluence identified  SPECIMEN(S):  None  INDICATIONS:   Elayne GuerinDianna B Salceda is a 50 y.o. female who presents with painful left arm secondary to the thrombosis within the basilic vein.  Serial ultrasounds have shown propagation of the vein and given her recent intracranial hemorrhage she is not eligible for anticoagulation. Therefore she is undergoing ligation of the basilic vein before thrombus propagates into the deep system.    DESCRIPTION: After full informed written consent was obtained from the patient, the patient was brought back to the operating room and placed supine upon the operating table.  Prior to induction, the patient received IV antibiotics.   After obtaining adequate anesthesia, the patient was then prepped and draped in the standard fashion for a  left arm exploration.  A linear incision was made over the medial aspect of the left arm and the deep veins were exposed. The confluence of the basilic vein with the axillary vein was identified. The dissection was carried out circumferentially. 2-0 silk was then used to flush ligate the basilic vein. 2 separate sutures were utilized.   The wound was then irrigated with sterile saline area did Hemostasis was then obtained with Bovie cautery. Fibrillar was then applied to the area. The wound was then closed in layers using 3-0 Vicryl followed by 4-0 Monocryl subcuticular and then Dermabond.   The patient tolerated this procedure well.   COMPLICATIONS: None  CONDITION: Stephanie [name]   [name]  Office:  [phone]   06/21/2016, 3:30 PM

## 2016-06-25 ENCOUNTER — Encounter: Payer: Self-pay | Admitting: Vascular Surgery

## 2016-06-27 ENCOUNTER — Ambulatory Visit (INDEPENDENT_AMBULATORY_CARE_PROVIDER_SITE_OTHER): Payer: PRIVATE HEALTH INSURANCE | Admitting: Neurology

## 2016-06-27 ENCOUNTER — Encounter: Payer: Self-pay | Admitting: Neurology

## 2016-06-27 VITALS — BP 105/71 | HR 61 | Ht 62.0 in | Wt 192.0 lb

## 2016-06-27 DIAGNOSIS — I10 Essential (primary) hypertension: Secondary | ICD-10-CM

## 2016-06-27 DIAGNOSIS — I82612 Acute embolism and thrombosis of superficial veins of left upper extremity: Secondary | ICD-10-CM | POA: Diagnosis not present

## 2016-06-27 DIAGNOSIS — I611 Nontraumatic intracerebral hemorrhage in hemisphere, cortical: Secondary | ICD-10-CM

## 2016-06-27 MED ORDER — CLOPIDOGREL BISULFATE 75 MG PO TABS: 75.0000 mg | ORAL_TABLET | Freq: Every day | ORAL | 3 refills | Status: DC

## 2016-06-27 NOTE — Patient Instructions (Addendum)
-   will start plavix for stroke and DVT prevention - will repeat CT without contrast and CTV  - will do extremity ultrasound to rule out DVT - will refill fioricet this time.  - will do blood draw to rule out hypercoagulable state - check BP and glucose at home - continue Keppra for now and will repeat EEG next visit.  - Follow up with your primary care physician for stroke risk factor modification. Recommend maintain blood pressure goal <130/80, diabetes with hemoglobin A1c goal below 7.0% and lipids with LDL cholesterol goal below 70 mg/dL.  - keep hydration - follow up in 3 months.

## 2016-06-27 NOTE — Progress Notes (Signed)
STROKE NEUROLOGY FOLLOW UP NOTE  NAME: Stephanie GuerinDianna B [name] DOB: [birth year]  REASON FOR VISIT: stroke follow up HISTORY FROM: Patient and chart  Today we had the pleasure of seeing Stephanie [name] in follow-up at our Neurology Clinic. Pt was accompanied by no one.   History Summary Ms.Stephanie [name] a 50 y.o.femalewith history of hepatitis C,sciatica, CHF, and drug seeking behavioradmitted on 04/18/2016 to [place] with complaints of diffuse pain and HA. CT showed a moderate Lpar[place]o-occipital ICH with IVH and SAH.[facility]. MRI without contrast hematoma stable. MRA negative. Repeat CT hematoma stable, however, concerning for left transverse sinus thrombosis. MRV also suggest non-occlusive left transverse sinus thrombosis. Put on IV heparin with close neuro monitoring. LDL 60 and A1c 8.1. TTE EF 65-70%. Headache controlled with Fioricet. Due to confusion, EEG was done showing positive for left temporal sharp wave discharges and generalized irregular slow activity. Put on Keppra, repeat EEG showed FIRDA and moderate diffuse slowing. BP controlled by PO medications.   However, after several days of the heparin IV treatment, a CTV performed and suggested bilateral transverse sinus nonocclusive arachnoid granulation without convincing evidence of dural venous sinus thrombosis. IV heparin discontinued. She was discharged without antiplatelet or anticoagulation, but with Keppra and BP/DM meds.  Interval History During the interval time, patient mental status improved and now at baseline. She continued to have headache, 3-4 times a week. Headache normal he start in the morning after getting up, left frontal HA with soreness of the left eye, pressure feeling, 7/10, lasting several hours. Normally ease off after lunch. Denies photophobia or phonophobia. Fioricet helps to abort the HA. She usually takes 1 tablet per day. The current headache is different from her  previous migraine. For the last 20 years she had history of migraine, usually at the right side with right eye pain, associated with N/V, photophobia, phonophobia, has to go to dark room. 2-3 times per months, relief without medication. She tried Imitrex before does not help.  For the last 1 months, she started to have a knot at left elbow up to her arm and had ultrasound at with vascular surgery in [place] found to have large superficial venous thrombosis (basilic vein), but no DVT. She continued to complain of increased pain and numbness of left hand, and repeat ultrasound 06/19/16 showed propagation of thrombus proximally and distally. The clot extended up to the axillary vein. She had basilic vein ligation done on 06/21/16. Currently still not on any antiplatelet.  REVIEW OF SYSTEMS: Full 14 system review of systems performed and notable only for those listed below and in HPI above, all others are negative:  Constitutional:   Cardiovascular:  Ear/Nose/Throat:   Skin:  Eyes:   Respiratory:   Gastroitestinal:   Genitourinary:  Hematology/Lymphatic:   Endocrine:  Musculoskeletal:   Allergy/Immunology:   Neurological:  Dizziness, headache Psychiatric:  Sleep: Apnea, snoring  The following represents the patient's updated allergies and side effects list: Allergies  Allergen Reactions  . Aspirin Itching  . Buprenorphine Hcl Itching  . Codeine Other (See Comments)    Makes her feel like something is crawling under her skin. Can take, sometimes makes her itch  . Compazine Itching    "makes my skin crawl"  . Ioxaglate Itching  . Ivp Dye [Iodinated Diagnostic Agents] Itching    Makes her itch really bad. Had to get 2-3 shots of Benadryl when she was in the hospital.  . Metrizamide Itching  . Naproxen  Itching  . Norco [Hydrocodone-Acetaminophen] Itching  . Penicillins Itching and Other (See Comments)    Makes her feel really uncomfortable. No other information available.  .  Prochlorperazine Maleate Other (See Comments)    Compazine makes her skin crawl - needs 2-3 shots of Benadryl to get relief.  . Reglan [Metoclopramide] Itching and Other (See Comments)    Makes her skin crawl  . Tegretol [Carbamazepine] Itching and Other (See Comments)    Makes her feel like something is crawling under her skin.  . Toradol [Ketorolac Tromethamine] Itching  . Tramadol Itching    The neurologically relevant items on the patient's problem list were reviewed on today's visit.  Neurologic Examination  A problem focused neurological exam (12 or more points of the single system neurologic examination, vital signs counts as 1 point, cranial nerves count for 8 points) was performed.  Blood pressure 105/71, pulse 61, height 5\' 2"  (1.575 m), weight 192 lb (87.1 kg).  General - Well nourished, well developed, in no apparent distress.  Ophthalmologic - Fundi not visualized due to eye movement.  Cardiovascular - Regular rate and rhythm with no murmur.  Mental Status -  Level of arousal and orientation to time, place, and person were intact. Language including expression, naming, repetition, comprehension was assessed and found intact. Fund of Knowledge was assessed and was intact.  Cranial Nerves II - XII - II - Visual field intact OU. III, IV, VI - Extraocular movements intact. V - Facial sensation intact bilaterally. VII - Facial movement intact bilaterally. VIII - Hearing & vestibular intact bilaterally. X - Palate elevates symmetrically. XI - Chin turning & shoulder shrug intact bilaterally. XII - Tongue protrusion intact.  Motor Strength - The patient's strength was normal in all extremities and pronator drift was absent.  Bulk was normal and fasciculations were absent.   Motor Tone - Muscle tone was assessed at the neck and appendages and was normal.  Reflexes - The patient's reflexes were 1+ in all extremities and she had no pathological reflexes.  Sensory -  Light touch, temperature/pinprick, vibration and proprioception, and Romberg testing were assessed and were normal.    Coordination - The patient had normal movements in the hands and feet with no ataxia or dysmetria.  Tremor was absent.  Gait and Station - The patient's transfers, posture, gait, station, and turns were observed as normal.   Functional score  mRS = 1   0 - No symptoms.   1 - No significant disability. Able to carry out all usual activities, despite some symptoms.   2 - Slight disability. Able to look after own affairs without assistance, but unable to carry out all previous activities.   3 - Moderate disability. Requires some help, but able to walk unassisted.   4 - Moderately severe disability. Unable to attend to own bodily needs without assistance, and unable to walk unassisted.   5 - Severe disability. Requires constant nursing care and attention, bedridden, incontinent.   6 - Dead.   NIH Stroke Scale = 0   Data reviewed: I personally reviewed the images and agree with the radiology interpretations.  Ct Head Wo Contrast 04/24/2016 Left posterior temporal hemorrhage unchanged. There is possible thrombosis in the left transverse sinus as the cause of the hemorrhage. CT venogram recommended for confirmation.  04/18/2016 No significant change since the prior study with 2.8 x 4.1 cm left parietal intraparenchymal hemorrhage with intraventricular extension and 2 mm left to right midline shift with some effacement  of the left basilar cisterns.   04/18/2016 Large intraparenchymal hemorrhage is noted in left posterior parietal cortex with extension into the left lateral and third ventricles. Small amount of left-sided subarachnoid hemorrhage is noted. 4 mm of left-to-right midline shift is noted.   MRI HEAD  04/20/2016 1. Similar size and appearance of acute right temporal occipital intraparenchymal hematoma measuring 4.0 x 2.8 x 2.7 cm (estimated volume 15  cc). Similar localized edema with trace 3 mm left-to-right shift.  2. Intraventricular extension with blood in the lateral and third ventricles. No evidence for hydrocephalus or ventricular trapping.  3. Small volume acute subarachnoid hemorrhage within the posterior cerebral hemispheres bilaterally, stable from prior CT.  4. Trace left subdural hemorrhage measuring up to 2 mm, difficult to visualize on prior study, but felt to be unchanged.   MRA HEAD  04/20/2016 1. Negative intracranial MRA. No vascular abnormality identified underlying the acute hemorrhage.  2. No large or proximal arterial branch occlusion. No high-grade or correctable stenosis.  3. Multi focal atheromatous irregularity involving the anterior posterior circulations as above, predominantly involving the distal small vessels.   MRV Head 04/24/2016 Filling defect at the junction of the LEFT transverse and sigmoid sinus consistent with nonocclusive LEFT dural venous sinus thrombosis.  2D Echocardiogram  - Procedure narrative: Transthoracic echocardiography. Imagequality was poor. The study was technically difficult, as aresult of poor sound wave transmission.  - Left ventricle: The cavity size was normal. Wall thickness wasincreased in a pattern of mild LVH. There was mild focal basalhypertrophy of the septum. Systolic function was vigorous. Theestimated ejection fraction was in the range of 65% to 70%. Wallmotion was normal; there were no regional wall motionabnormalities. Doppler parameters are consistent with abnormalleft ventricular relaxation (grade 1 diastolic dysfunction). Impressions:   Vigorous LV systolic function; grade 1 diastolic dysfunction.  EEG 04/24/16 1) left temporal sharp wave discharges 2) generalized irregular slow activity Clinical Interpretation: This EEG indicates a potential seizure focus in the left temporal region. No seizure was recorded on this study.  EEG 04/26/16 This awake and  asleepEEG is abnormal due to the presence of: 1. Moderatediffuse slowing of the waking background 2. Frontal intermittent rhythmic delta activity (FIRDA) Clinical Correlation of the above findings indicates diffuse cerebral dysfunction that is non-specific in etiology and can be seen with hypoxic/ischemic injury, toxic/metabolic encephalopathies, or medication effect. FIRDA can be seen with neuronal dysfunction from metabolic encephalopathy, ischemic injury, increased intracranial pressure, or from deep diencephalic or brainstem lesions. The absence of epileptiform discharges does not rule out a clinical diagnosis of epilepsy. Clinical correlation is advised.  CTV 04/29/16 CT HEAD: Evolving LEFT parietal occipital lobe hemorrhage with local mass effect, no midline shift. Trace LEFT occipital lobe subarachnoid hemorrhage, alternately thrombosed cortical vein. CTV head: Bilateral transverse sinus nonocclusive arachnoid granulation without convincing evidence of dural venous sinus thrombosis. Abnormal enhancement along the inferior margin of the hemorrhage, though this could represent transient hyperemia, underlying vascular malformation or mass is a consideration. Recommend 6 week follow-up MRI of the head with contrast. Alternatively CTA or catheter angiography could be done sooner.  UE venous doppler 05/27/16 - Superficial venous thrombosis involving the left basilic vein. The thrombus extends throughout the left forearm and terminates near the elbow. Segment of the thrombosed vein is quite large and accounts for the palpable abnormality. No deep venous thrombosis in the left upper extremity.  UE venous doppler 06/19/16 -  Occlusive, superficial vein thrombosis of left basilic vein upper arm and forearm extending to  brachial/axillary confluence. No evidence of DVT. Comparing with previous study, there is a propagation of left upper extremity superficial thrombosis.  Component     Latest  Ref Rng & Units 04/18/2016 04/22/2016  Cholesterol     0 - 200 mg/dL  161  Triglycerides     <150 mg/dL  91  HDL Cholesterol     >40 mg/dL  40 (L)  Total CHOL/HDL Ratio     RATIO  3.0  VLDL     0 - 40 mg/dL  18  LDL (calc)     0 - 99 mg/dL  60  Hemoglobin W9U     4.8 - 5.6 %  8.1 (H)  Mean Plasma Glucose     mg/dL  045  TSH     4.098 - 1.191 uIU/mL 1.471   T4,Free(Direct)     0.61 - 1.12 ng/dL 4.78     Assessment: As you may recall, she is a 50 y.o. Caucasian female with PMH of hepatitis C,sciatica, CHF, and drug seeking behavioradmitted on 04/18/2016 for a moderate Lparieto-occipital ICH with IVH and SAH.MRI without contrast hematoma stable. MRA negative. Repeat CT hematoma stable, however, concerning for left transverse sinus thrombosis. MRV also suggest non-occlusive left transverse sinus thrombosis. Put on IV heparin with close neuro monitoring. LDL 60 and A1c 8.1. TTE EF 65-70%. Headache controlled with Fioricet. EEG showed positive for left temporal sharp wave discharges and generalized irregular slow activity. Put on Keppra, repeat EEG showed FIRDA and moderate diffuse slowing. BP controlled by PO medications. However, after several days of the heparin IV treatment, a CTV performed and suggested bilateral transverse sinus nonocclusive arachnoid granulation without convincing evidence of dural venous sinus thrombosis. IV heparin discontinued. She was discharged without antiplatelet or anticoagulation. During the interval time, patient mental status improved and now at baseline. Developed large superficial venous thrombosis (basilic vein), but no DVT. And repeat ultrasound on 06/19/16 showed propagation of thrombus proximally and distally. The clot extended up to the axillary vein. She had basilic vein ligation done on 06/21/16. Patient has allergy of aspirin.  She continued to have headache, 3-4 times a week, lasting several hours each. Fioricet helps to abort the HA. The current  headache is different from her previous migraine.   Plan:  - start plavix for stroke and DVT prevention - repeat CT without contrast, CTA and CTV  - UE and LE venous doppler to rule out DVT in other extremities - continue fioricet for headache abortion at this time. May consider Diamox if needed - hypercoagulable workup - check BP and glucose at home - continue Keppra for now and will repeat EEG next visit.  - Follow up with your primary care physician for stroke risk factor modification. Recommend maintain blood pressure goal <130/80, diabetes with hemoglobin A1c goal below 7.0% and lipids with LDL cholesterol goal below 70 mg/dL.  - keep hydration - follow up in 3 months.   A total of 25 minutes was spent face-to-face with this patient. Over half this time was spent on counseling patient on the venous thrombosis diagnosis and different diagnostic and therapeutic options available. We also discussed about etiology of the headache and management. Medical decision making is complicated in this patient.   Orders Placed This Encounter  Procedures  . CT HEAD WO CONTRAST    Standing Status:   Future    Standing Expiration Date:   09/27/2017    Order Specific Question:   Reason for Exam (SYMPTOM  OR DIAGNOSIS  REQUIRED)    Answer:   follow up with ICH    Order Specific Question:   Is patient pregnant?    Answer:   No    Order Specific Question:   Preferred imaging location?    Answer:   Internal  . CT VENOGRAM HEAD    Standing Status:   Future    Standing Expiration Date:   09/27/2017    Order Specific Question:   Reason for Exam (SYMPTOM  OR DIAGNOSIS REQUIRED)    Answer:   follow up with possible left side venous sinus thrombosis    Order Specific Question:   Is patient pregnant?    Answer:   No    Order Specific Question:   Preferred imaging location?    Answer:   Internal  . CT ANGIO HEAD W OR WO CONTRAST    Standing Status:   Future    Standing Expiration Date:   08/28/2017     Order Specific Question:   If indicated for the ordered procedure, I authorize the administration of contrast media per Radiology protocol    Answer:   Yes    Order Specific Question:   Reason for Exam (SYMPTOM  OR DIAGNOSIS REQUIRED)    Answer:   rule out AVM at left temporal ICH area    Order Specific Question:   Is patient pregnant?    Answer:   No    Order Specific Question:   Preferred imaging location?    Answer:   Internal  . CBC (no diff)  . Basic metabolic panel  . Hypercoagulable panel, comprehensive  . Systemic Lupus Profile A    Meds ordered this encounter  Medications  . zolpidem (AMBIEN) 10 MG tablet  . Butalbital-APAP-Caffeine 50-300-40 MG CAPS  . INVOKANA 300 MG TABS tablet  . clopidogrel (PLAVIX) 75 MG tablet    Sig: Take 1 tablet (75 mg total) by mouth daily.    Dispense:  90 tablet    Refill:  3    Patient Instructions  - will start plavix for stroke and DVT prevention - will repeat CT without contrast and CTV  - will do extremity ultrasound to rule out DVT - will refill fioricet this time.  - will do blood draw to rule out hypercoagulable state - check BP and glucose at home - continue Keppra for now and will repeat EEG next visit.  - Follow up with your primary care physician for stroke risk factor modification. Recommend maintain blood pressure goal <130/80, diabetes with hemoglobin A1c goal below 7.0% and lipids with LDL cholesterol goal below 70 mg/dL.  - keep hydration - follow up in 3 months.    Marvel Plan, MD PhD [facility] [address], [address] [place], [place] [address] [phone]

## 2016-06-28 DIAGNOSIS — G4730 Sleep apnea, unspecified: Secondary | ICD-10-CM | POA: Insufficient documentation

## 2016-07-02 ENCOUNTER — Ambulatory Visit (INDEPENDENT_AMBULATORY_CARE_PROVIDER_SITE_OTHER): Payer: PRIVATE HEALTH INSURANCE | Admitting: Vascular Surgery

## 2016-07-08 ENCOUNTER — Other Ambulatory Visit: Payer: Self-pay | Admitting: Neurology

## 2016-07-08 ENCOUNTER — Encounter (INDEPENDENT_AMBULATORY_CARE_PROVIDER_SITE_OTHER): Payer: Self-pay | Admitting: Vascular Surgery

## 2016-07-08 ENCOUNTER — Ambulatory Visit (INDEPENDENT_AMBULATORY_CARE_PROVIDER_SITE_OTHER): Payer: PRIVATE HEALTH INSURANCE | Admitting: Vascular Surgery

## 2016-07-08 VITALS — BP 120/78 | HR 68 | Resp 17 | Ht 62.0 in | Wt 189.0 lb

## 2016-07-08 DIAGNOSIS — I611 Nontraumatic intracerebral hemorrhage in hemisphere, cortical: Secondary | ICD-10-CM

## 2016-07-08 DIAGNOSIS — I10 Essential (primary) hypertension: Secondary | ICD-10-CM

## 2016-07-08 DIAGNOSIS — I82612 Acute embolism and thrombosis of superficial veins of left upper extremity: Secondary | ICD-10-CM

## 2016-07-08 LAB — BASIC METABOLIC PANEL
BUN / CREAT RATIO: 17 (ref 9–23)
BUN: 11 mg/dL (ref 6–24)
CO2: 28 mmol/L (ref 18–29)
CREATININE: 0.65 mg/dL (ref 0.57–1.00)
Calcium: 10 mg/dL (ref 8.7–10.2)
Chloride: 104 mmol/L (ref 96–106)
GFR, EST AFRICAN AMERICAN: 119 mL/min/{1.73_m2} (ref >59)
GFR, EST NON AFRICAN AMERICAN: 103 mL/min/{1.73_m2} (ref >59)
Glucose: 165 mg/dL — ABNORMAL HIGH (ref 65–99)
Potassium: 5 mmol/L (ref 3.5–5.2)
Sodium: 147 mmol/L — ABNORMAL HIGH (ref 134–144)

## 2016-07-08 LAB — HYPERCOAGULABLE PANEL, COMPREHENSIVE
ACT. PRT C RESIST W/FV DEFIC.: 3 ratio
APTT: 25 s
AT III Act/Nor PPP Chro: 84 %
Anticardiolipin Ab, IgG: 12 [GPL'U]
Anticardiolipin Ab, IgM: <10 [MPL'U]
Beta-2 Glycoprotein I, IgG: <10 SGU
Beta-2 Glycoprotein I, IgM: <10 SMU
DRVVT CONFIRM SECONDS: 36.3 s
DRVVT RATIO: 1.2 ratio
DRVVT Screen Seconds: 49.2 s — ABNORMAL HIGH
FACTOR VIII ACTIVITY: 142 %
Factor VII Antigen**: 134 %
HOMOCYSTEINE: 14.3 umol/L
Hexagonal Phospholipid Neutral: 7 s
PROT S AG ACT/NOR PPP IMM: 106 %
PROTEIN S AG/FVII AG RATIO: 0.8 ratio
Prot C Ag Act/Nor PPP Imm: 99 %
Protein C Ag/FVII Ag Ratio**: 0.7 ratio

## 2016-07-08 LAB — SYSTEMIC LUPUS PROFILE A
Chromatin Ab SerPl-aCnc: <0.2 AI (ref 0.0–0.9)
DSDNA AB: 1 [IU]/mL (ref 0–9)
ENA RNP Ab: <0.2 AI (ref 0.0–0.9)
ENA SM Ab Ser-aCnc: <0.2 AI (ref 0.0–0.9)
ENA SSA (RO) Ab: 0.2 AI (ref 0.0–0.9)
ENA SSB (LA) Ab: <0.2 AI (ref 0.0–0.9)
RHEUMATOID FACTOR: 10.5 [IU]/mL (ref 0.0–13.9)

## 2016-07-08 LAB — CBC
HEMOGLOBIN: 15.1 g/dL (ref 11.1–15.9)
Hematocrit: 44.9 % (ref 34.0–46.6)
MCH: 31.8 pg (ref 26.6–33.0)
MCHC: 33.6 g/dL (ref 31.5–35.7)
MCV: 95 fL (ref 79–97)
Platelets: 231 10*3/uL (ref 150–379)
RBC: 4.75 x10E6/uL (ref 3.77–5.28)
RDW: 13.9 % (ref 12.3–15.4)
WBC: 7.7 10*3/uL (ref 3.4–10.8)

## 2016-07-09 ENCOUNTER — Telehealth: Payer: Self-pay

## 2016-07-09 ENCOUNTER — Ambulatory Visit: Payer: Self-pay | Admitting: Neurology

## 2016-07-09 NOTE — Telephone Encounter (Signed)
-----   Message from Jindong Xu, MD sent at 07/08/2016  5:16 PM EDT ----- Could you please let the patient know that the blood test done recently in our office was normal and no abnormality to explain for her arm superficial vein clot. Please continue current treatment. Thanks.  Jindong Xu, MD PhD Stroke Neurology 07/08/2016 5:16 PM  

## 2016-07-09 NOTE — Telephone Encounter (Signed)
-----   Message from Marvel PlanJindong Xu, MD sent at 07/08/2016  5:16 PM EDT ----- Could you please let the patient know that the blood test done recently in our office was normal and no abnormality to explain for her arm superficial vein clot. Please continue current treatment. Thanks.  Marvel PlanJindong Xu, MD PhD Stroke Neurology 07/08/2016 5:16 PM

## 2016-07-09 NOTE — Telephone Encounter (Deleted)
ffff

## 2016-07-09 NOTE — Telephone Encounter (Signed)
Thank you both for the strong work.   Marvel PlanJindong Thorvald Orsino, MD PhD Stroke Neurology 07/09/2016 5:10 PM

## 2016-07-09 NOTE — Telephone Encounter (Signed)
Stephanie Guth[facility]Donna [facility] [phone] Patient Venous is scheduled for 07/12/2016. Lupita LeashDonna relayed Venous was in there work que.

## 2016-07-09 NOTE — Telephone Encounter (Signed)
Rn call patient about her blood work. Rn stated the labs are normal, and does not explain the abnormality of her superficial vein clot. Continue treatment plan. Pt verbalized understanding.

## 2016-07-09 NOTE — Telephone Encounter (Addendum)
Rn call [phone] and spoke with Stephanie [name] in vascular. Stephanie [name] was in presence of the conversation.. Rn explain pt had a venous duplex doppler order in April 2017 and it was never schedule. Rn also explain to Stephanie [name] the Md order the same test on 06/27/2016 and 06/28/2016 and pt was not schedule. Rn also stated a phone call was made on 07/08/3016 to their dept about the order number for both test. The order number was given to Dr. Roda ShuttersXu to order both.The vas us venous duplex order was showing up under cv procedure. The nurse stated the staff at her dept stated the order could not be seen and it should be under imaging. The vascular stated it should not be under cv procedure.  Rn stated to Stephanie LeashDonna do they have a work quere that has all of their orders. Stephanie [name] stated they dont work from a work quere for outpatient orders. Stephanie [name] stated they only work from a quere for ED, and [facility]. Stephanie [name] also stated for any outpatient office they give the appt to the office. The referring office has to call the pt about their appt time. The vascular office does not call the pt to schedule venous duplex. Rn stated Dr. Roda ShuttersXu put the order in the same way as another MD did. Stephanie [name] stated they can see the two orders now for venous duplex of arms.and legs. Rn ask was it a issue on their end on how they see the order. Stephanie [name] stated she thinks its a epic issue on how they see the orders.[name] my presence when this telephone call was occurring. Rn stated to [name] we want to create a better work flow for our patients so no test are delayed. Dr. Roda ShuttersXu was made aware of this issue.

## 2016-07-11 ENCOUNTER — Ambulatory Visit (INDEPENDENT_AMBULATORY_CARE_PROVIDER_SITE_OTHER): Payer: MEDICAID | Admitting: Vascular Surgery

## 2016-07-11 ENCOUNTER — Other Ambulatory Visit (INDEPENDENT_AMBULATORY_CARE_PROVIDER_SITE_OTHER): Payer: MEDICAID

## 2016-07-12 ENCOUNTER — Encounter (HOSPITAL_COMMUNITY): Payer: 59

## 2016-07-12 ENCOUNTER — Ambulatory Visit (HOSPITAL_COMMUNITY)
Admission: RE | Admit: 2016-07-12 | Discharge: 2016-07-12 | Disposition: A | Discharge status: Home / Self Care | Payer: 59 | Source: Ambulatory Visit | Attending: Neurology | Admitting: Neurology

## 2016-07-12 ENCOUNTER — Ambulatory Visit (HOSPITAL_COMMUNITY): Admission: RE | Admit: 2016-07-12 | Payer: 59 | Source: Ambulatory Visit

## 2016-07-12 DIAGNOSIS — I82612 Acute embolism and thrombosis of superficial veins of left upper extremity: Secondary | ICD-10-CM

## 2016-07-19 ENCOUNTER — Ambulatory Visit (HOSPITAL_COMMUNITY)
Admission: RE | Admit: 2016-07-19 | Discharge: 2016-07-19 | Disposition: A | Discharge status: Home / Self Care | Payer: PRIVATE HEALTH INSURANCE | Source: Ambulatory Visit | Attending: Neurology | Admitting: Neurology

## 2016-07-19 DIAGNOSIS — I82612 Acute embolism and thrombosis of superficial veins of left upper extremity: Secondary | ICD-10-CM | POA: Diagnosis present

## 2016-07-19 NOTE — Progress Notes (Signed)
VASCULAR LAB PRELIMINARY  PRELIMINARY  PRELIMINARY  PRELIMINARY  Bilateral upper extremity venous duplex completed.    Preliminary report:  Right No evidence of DVT or superficial thrombosis Patient is known to have a left basilic thrombus in 07/09/2016, The thrombus was ligated and tied off. There is still evidence of thrombosis of the left basilic vein to the ligation from the elbow to the mid forearm. There is no evidence of left upper extremity DVT.  Tylesha Gibeault, RVT 07/19/2016, 6:07 PM

## 2016-07-19 NOTE — Progress Notes (Signed)
VASCULAR LAB PRELIMINARY  PRELIMINARY  PRELIMINARY  PRELIMINARY  Bilateral lower extremity venous duplex completed.    Preliminary report:  Bilateral:  No evidence of DVT, superficial thrombosis, or Baker's Cyst.   Devory Mckinzie, RVS 07/19/2016, 6:06 PM

## 2016-07-22 ENCOUNTER — Encounter (INDEPENDENT_AMBULATORY_CARE_PROVIDER_SITE_OTHER): Payer: Self-pay | Admitting: Vascular Surgery

## 2016-07-22 ENCOUNTER — Ambulatory Visit (INDEPENDENT_AMBULATORY_CARE_PROVIDER_SITE_OTHER): Payer: PRIVATE HEALTH INSURANCE | Admitting: Vascular Surgery

## 2016-07-22 VITALS — BP 126/78 | HR 55 | Resp 17 | Ht 63.0 in | Wt 189.0 lb

## 2016-07-22 DIAGNOSIS — M79602 Pain in left arm: Secondary | ICD-10-CM

## 2016-07-22 DIAGNOSIS — M7989 Other specified soft tissue disorders: Secondary | ICD-10-CM

## 2016-07-22 DIAGNOSIS — I82612 Acute embolism and thrombosis of superficial veins of left upper extremity: Secondary | ICD-10-CM

## 2016-07-22 DIAGNOSIS — I10 Essential (primary) hypertension: Secondary | ICD-10-CM

## 2016-07-22 DIAGNOSIS — I82712 Chronic embolism and thrombosis of superficial veins of left upper extremity: Secondary | ICD-10-CM

## 2016-07-22 HISTORY — DX: Acute embolism and thrombosis of superficial veins of left upper extremity: I82.612

## 2016-07-22 NOTE — Progress Notes (Signed)
Subjective:    [name]: Stephanie [name], female    DOB: Dec 13, 1965, 50 y.o.   MRN: [id number] Chief Complaint  Patient presents with  . Re-evaluation    2 week follow up    The patient is s/p ligation of the left basilic vein at the confluence with the axillary vein on 06/21/16. Her incision is healing well. Still experiencing some swelling and pain to the left upper extremity. She underwent an upper extremity DVT study on 07/19/16 and was found to have a subacute superficial thrombus of the left basilic vein distal to the ligation from the elbow to the mid forearm. Lower extremity DVT study negative for DVT bilaterally.    Review of Systems  Constitutional: Negative.   HENT: Negative.   Eyes: Negative.   Respiratory: Negative.   Cardiovascular: Negative.   Gastrointestinal: Negative.   Endocrine: Negative.   Genitourinary:       ESRD  Musculoskeletal: Negative.   Skin: Negative.   Allergic/Immunologic: Negative.   Neurological: Negative.   Hematological: Negative.   Psychiatric/Behavioral: Negative.       Objective:   Physical Exam  Constitutional: She is oriented to person, place, and time. She appears well-developed and well-nourished.  HENT:  Head: Normocephalic and atraumatic.  Eyes: Conjunctivae and EOM are normal. Pupils are equal, round, and reactive to light.  Neck: Normal range of motion.  Cardiovascular: Normal rate, regular rhythm, normal heart sounds and intact distal pulses.   Pulses:      Radial pulses are 2+ on the right side, and 2+ on the left side.       Dorsalis pedis pulses are 2+ on the right side, and 2+ on the left side.       Posterior tibial pulses are 2+ on the right side, and 2+ on the left side.  Pulmonary/Chest: Effort normal and breath sounds normal.  Abdominal: Soft. Bowel sounds are normal.  Musculoskeletal: Normal range of motion. She exhibits no edema.  Neurological: She is alert and oriented to person, place, and time.  Skin:  Skin is warm and dry.  Incision: Healing well.   Psychiatric: She has a normal mood and affect. Her behavior is normal. Judgment and thought content normal.   BP 126/78 (BP Location: Right Arm)   Pulse (!) 55   Resp 17   Ht 5\' 3"  (1.6 m)   Wt 189 lb (85.7 kg)   BMI 33.48 kg/m   Past Medical History:  Diagnosis Date  . Diabetes mellitus without complication (HCC)   . Diverticulitis   . Hepatitis C   . Hypertension   . Kidney stones   . Sciatica   . Stroke [facility])    Social History   Social History  . Marital status: Married    Spouse name: N/A  . Number of children: N/A  . Years of education: N/A   Occupational History  . Not on file.   Social History Main Topics  . Smoking status: Current Every Day Smoker    Packs/day: 0.25    Years: 1.50    Types: Cigarettes  . Smokeless tobacco: Never Used  . Alcohol use No  . Drug use: No     Comment: electric cig  . Sexual activity: Not on file   Other Topics Concern  . Not on file   Social History Narrative  . No narrative on file   Past Surgical History:  Procedure Laterality Date  . ABDOMINAL HYSTERECTOMY    . KIDNEY  STONE SURGERY Right 02/12/12  . KIDNEY STONE SURGERY Left 07/15/2012  . LIGATION OF ARTERIOVENOUS  FISTULA Left 06/21/2016   Procedure: LIGATION OF ARTERIOVENOUS  FISTULA ( LIGATION BASILIC VEIN );  Surgeon: Renford DillsGregory G Schnier, MD;  Location: [facility] ORS;  Service: Vascular;  Laterality: Left;  . LITHOTRIPSY    . OOPHORECTOMY    . SINUS EXPLORATION     Family History  Problem Relation Age of Onset  . Heart failure Mother   . Diabetes Father   . Cancer Sister    Allergies  Allergen Reactions  . Aspirin Itching  . Buprenorphine Hcl Itching  . Codeine Other (See Comments)    Makes her feel like something is crawling under her skin. Can take, sometimes makes her itch  . Compazine Itching    "makes my skin crawl"  . Ioxaglate Itching  . Ivp Dye [Iodinated Diagnostic Agents] Itching    Makes her itch  really bad. Had to get 2-3 shots of Benadryl when she was in the hospital.  . Metrizamide Itching  . Naproxen Itching  . Norco [Hydrocodone-Acetaminophen] Itching  . Penicillins Itching and Other (See Comments)    Makes her feel really uncomfortable. No other information available.  . Prochlorperazine Maleate Other (See Comments)    Compazine makes her skin crawl - needs 2-3 shots of Benadryl to get relief.  . Reglan [Metoclopramide] Itching and Other (See Comments)    Makes her skin crawl  . Tegretol [Carbamazepine] Itching and Other (See Comments)    Makes her feel like something is crawling under her skin.  . Toradol [Ketorolac Tromethamine] Itching  . Tramadol Itching      Assessment & Plan:  The patient is s/p ligation of the left basilic vein at the confluence with the axillary vein on 06/21/16. Her incision is healing well. Still experiencing some swelling and pain to the left upper extremity. She underwent an upper extremity DVT study on 07/19/16 and was found to have a subacute superficial thrombus of the left basilic vein distal to the ligation from the elbow to the mid forearm. Lower extremity DVT study negative for DVT bilaterally.   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   2. Pain and swelling of left upper extremity - No Improvement Oxycodone for pain. Encouraged compression sleeve, elevation and warm compresses.  No vascular compromise to extremity.  3. Essential hypertension, malignant - Stable Encouraged good control as its slows the progression of atherosclerotic disease  Current Outpatient Prescriptions on File Prior to Visit  Medication Sig Dispense Refill  . alprazolam (XANAX) 2 MG tablet Take 2 mg by mouth daily as needed for anxiety.     Marland Kitchen. amLODipine (NORVASC) 5 MG tablet Take 1 tablet (5 mg total) by mouth daily. 30 tablet 0  . busPIRone (BUSPAR) 10 MG tablet Take 10 mg by mouth 3 (three)  times daily.    . butalbital-acetaminophen-caffeine (FIORICET, ESGIC) 50-325-40 MG tablet Take 1 tablet by mouth 2 (two) times daily as needed for headache.    . Butalbital-APAP-Caffeine 50-300-40 MG CAPS     . cloNIDine (CATAPRES) 0.1 MG tablet daily.    . clopidogrel (PLAVIX) 75 MG tablet Take 1 tablet (75 mg total) by mouth daily. 90 tablet 3  . Diclofenac Sodium (PENNSAID) 2 % SOLN Place onto the skin 3 (three) times daily.    Marland Kitchen. dicyclomine (BENTYL) 20 MG tablet Take 20 mg by mouth 2 (two) times  daily.     . DULoxetine (CYMBALTA) 60 MG capsule Take 60 mg by mouth daily.    Marland Kitchen. glipiZIDE (GLUCOTROL) 5 MG tablet Take 1 tablet (5 mg total) by mouth 2 (two) times daily before a meal. 60 tablet 0  . INVOKANA 300 MG TABS tablet     . labetalol (NORMODYNE) 100 MG tablet Take 1 tablet (100 mg total) by mouth 3 (three) times daily. 90 tablet 0  . levETIRAcetam (KEPPRA) 500 MG tablet Take 1 tablet (500 mg total) by mouth 2 (two) times daily. 60 tablet 0  . losartan (COZAAR) 50 MG tablet Take 50 mg by mouth 2 (two) times daily.    . metFORMIN (GLUCOPHAGE) 500 MG tablet Take 1,000 mg by mouth 2 (two) times daily with a meal.    . metoprolol (LOPRESSOR) 50 MG tablet     . OLANZapine zydis (ZYPREXA) 5 MG disintegrating tablet Take 1 tablet (5 mg total) by mouth at bedtime. 30 tablet 0  . omeprazole (PRILOSEC) 20 MG capsule Take 40 mg by mouth daily.    Marland Kitchen. oxyCODONE-acetaminophen (ROXICET) 5-325 MG tablet Take 1-2 tablets by mouth every 6 (six) hours as needed for moderate pain or severe pain. 50 tablet 0  . zolpidem (AMBIEN) 10 MG tablet      No current facility-administered medications on file prior to visit.     There are no Patient Instructions on file for this visit. No Follow-up on file.   Jaze Rodino A Chrysten Woulfe, PA-C

## 2016-07-23 ENCOUNTER — Telehealth: Payer: Self-pay

## 2016-07-23 NOTE — Telephone Encounter (Signed)
Patient returned Katrina's call °

## 2016-07-23 NOTE — Telephone Encounter (Signed)
LFt vm for patient to call back about ultrasound test done in hospital.

## 2016-07-23 NOTE — Telephone Encounter (Signed)
-----   Message from Marvel PlanJindong Xu, MD sent at 07/20/2016  6:25 AM EST ----- Could you please let the patient know that the ultrasound test done recently in hospital was negative for any new clot. Please continue current treatment. Thanks.  Marvel PlanJindong Xu, MD PhD Stroke Neurology 07/20/2016 6:24 AM

## 2016-07-24 NOTE — Telephone Encounter (Signed)
Rn call patient that her ultrasound test was negative for any new clot. Pt verbalized understanding. Pt is still seeing her vein vascular md for her issues.

## 2016-07-29 ENCOUNTER — Ambulatory Visit (INDEPENDENT_AMBULATORY_CARE_PROVIDER_SITE_OTHER): Payer: MEDICAID | Admitting: Vascular Surgery

## 2016-07-29 ENCOUNTER — Encounter (INDEPENDENT_AMBULATORY_CARE_PROVIDER_SITE_OTHER): Payer: Self-pay

## 2016-07-29 ENCOUNTER — Encounter: Payer: Self-pay | Admitting: *Deleted

## 2016-07-29 ENCOUNTER — Emergency Department: Payer: 59

## 2016-07-29 ENCOUNTER — Emergency Department
Admission: EM | Admit: 2016-07-29 | Discharge: 2016-07-29 | Disposition: A | Discharge status: Home / Self Care | Payer: 59 | Attending: Emergency Medicine | Admitting: Emergency Medicine

## 2016-07-29 DIAGNOSIS — R1031 Right lower quadrant pain: Secondary | ICD-10-CM

## 2016-07-29 DIAGNOSIS — F17210 Nicotine dependence, cigarettes, uncomplicated: Secondary | ICD-10-CM | POA: Diagnosis not present

## 2016-07-29 DIAGNOSIS — Z79899 Other long term (current) drug therapy: Secondary | ICD-10-CM | POA: Diagnosis not present

## 2016-07-29 DIAGNOSIS — N390 Urinary tract infection, site not specified: Secondary | ICD-10-CM | POA: Diagnosis not present

## 2016-07-29 DIAGNOSIS — I10 Essential (primary) hypertension: Secondary | ICD-10-CM | POA: Diagnosis not present

## 2016-07-29 DIAGNOSIS — E119 Type 2 diabetes mellitus without complications: Secondary | ICD-10-CM | POA: Diagnosis not present

## 2016-07-29 DIAGNOSIS — Z7984 Long term (current) use of oral hypoglycemic drugs: Secondary | ICD-10-CM | POA: Insufficient documentation

## 2016-07-29 LAB — LIPASE, BLOOD: Lipase: 28 U/L (ref 11–51)

## 2016-07-29 LAB — URINALYSIS COMPLETE WITH MICROSCOPIC (ARMC ONLY)
Bilirubin Urine: NEGATIVE
KETONES UR: NEGATIVE mg/dL
NITRITE: NEGATIVE
Protein, ur: NEGATIVE mg/dL
SPECIFIC GRAVITY, URINE: 1.006 (ref 1.005–1.030)
pH: 6 (ref 5.0–8.0)

## 2016-07-29 LAB — CBC
HCT: 46.8 % (ref 35.0–47.0)
Hemoglobin: 15.9 g/dL (ref 12.0–16.0)
MCH: 31.8 pg (ref 26.0–34.0)
MCHC: 33.9 g/dL (ref 32.0–36.0)
MCV: 93.9 fL (ref 80.0–100.0)
Platelets: 180 10*3/uL (ref 150–440)
RBC: 4.99 MIL/uL (ref 3.80–5.20)
RDW: 14.3 % (ref 11.5–14.5)
WBC: 8.1 10*3/uL (ref 3.6–11.0)

## 2016-07-29 LAB — COMPREHENSIVE METABOLIC PANEL
ALBUMIN: 4.1 g/dL (ref 3.5–5.0)
ALK PHOS: 92 U/L (ref 38–126)
ALT: 17 U/L (ref 14–54)
ANION GAP: 9 (ref 5–15)
AST: 21 U/L (ref 15–41)
BILIRUBIN TOTAL: 0.3 mg/dL (ref 0.3–1.2)
BUN: 13 mg/dL (ref 6–20)
CALCIUM: 9.5 mg/dL (ref 8.9–10.3)
CO2: 24 mmol/L (ref 22–32)
Chloride: 106 mmol/L (ref 101–111)
Creatinine, Ser: 0.57 mg/dL (ref 0.44–1.00)
GFR calc Af Amer: >60 mL/min (ref >60)
GLUCOSE: 88 mg/dL (ref 65–99)
Potassium: 3.8 mmol/L (ref 3.5–5.1)
Sodium: 139 mmol/L (ref 135–145)
TOTAL PROTEIN: 7.5 g/dL (ref 6.5–8.1)

## 2016-07-29 MED ORDER — CEFTRIAXONE SODIUM-DEXTROSE 1-3.74 GM-% IV SOLR
1.0000 g | Freq: Once | INTRAVENOUS | Status: AC
  Administered 2016-07-29: 1 g via INTRAVENOUS
  Filled 2016-07-29 (×2): qty 50

## 2016-07-29 MED ORDER — OXYCODONE-ACETAMINOPHEN 5-325 MG PO TABS: 1.0000 | ORAL_TABLET | Freq: Four times a day (QID) | ORAL | 0 refills | Status: DC | PRN

## 2016-07-29 MED ORDER — SODIUM CHLORIDE 0.9 % IV BOLUS (SEPSIS)
1000.0000 mL | Freq: Once | INTRAVENOUS | Status: AC
  Administered 2016-07-29: 1000 mL via INTRAVENOUS

## 2016-07-29 MED ORDER — DEXTROSE 5 % IV SOLN: 1.0000 g | Freq: Once | INTRAVENOUS | Status: DC

## 2016-07-29 MED ORDER — ONDANSETRON HCL 4 MG/2ML IJ SOLN
4.0000 mg | Freq: Once | INTRAMUSCULAR | Status: AC
  Administered 2016-07-29: 4 mg via INTRAVENOUS

## 2016-07-29 MED ORDER — ONDANSETRON 4 MG PO TBDP: 4.0000 mg | ORAL_TABLET | Freq: Three times a day (TID) | ORAL | 0 refills | Status: DC | PRN

## 2016-07-29 MED ORDER — CEPHALEXIN 500 MG PO CAPS: 500.0000 mg | ORAL_CAPSULE | Freq: Three times a day (TID) | ORAL | 0 refills | Status: DC

## 2016-07-29 MED ORDER — ONDANSETRON HCL 4 MG/2ML IJ SOLN
INTRAMUSCULAR | Status: AC
  Administered 2016-07-29: 4 mg via INTRAVENOUS
  Filled 2016-07-29: qty 2

## 2016-07-29 MED ORDER — MORPHINE SULFATE (PF) 4 MG/ML IV SOLN
4.0000 mg | Freq: Once | INTRAVENOUS | Status: AC
  Administered 2016-07-29: 4 mg via INTRAVENOUS

## 2016-07-29 MED ORDER — MORPHINE SULFATE (PF) 4 MG/ML IV SOLN
INTRAVENOUS | Status: AC
  Administered 2016-07-29: 4 mg via INTRAVENOUS
  Filled 2016-07-29: qty 1

## 2016-07-29 MED ORDER — MORPHINE SULFATE (PF) 4 MG/ML IV SOLN
4.0000 mg | Freq: Once | INTRAVENOUS | Status: AC
  Administered 2016-07-29: 4 mg via INTRAVENOUS
  Filled 2016-07-29: qty 1

## 2016-07-29 NOTE — ED Provider Notes (Signed)
[facility] Emergency Department Provider Note  Time seen: 7:54 PM  I have reviewed the triage vital signs and the nursing notes.   HISTORY  Chief Complaint Abdominal Pain    HPI Stephanie [name] is a 50 y.o. female with a past medical history of diabetes, CVA, kidney stones, presents the emergency department with right lower quadrant abdominal pain. According to the patient for the past 2 weeks she has been experiencing lower abdominal/right lower quadrant pain. States intermittent nausea, subjective fevers. States significant dysuria. Denies any hematuria. Patient states a significant history of kidney stones in the past which this feels somewhat similar.  Past Medical History:  Diagnosis Date  . Diabetes mellitus without complication (HCC)   . Diverticulitis   . Hepatitis C   . Hypertension   . Kidney stones   . Sciatica   . Stroke [facility])     Patient Active Problem List   Diagnosis Date Noted  . Pain and swelling of left upper extremity 07/22/2016  . Chronic thrombosis of left basilic vein 07/22/2016  . ICH (intracerebral hemorrhage) (HCC)   . Essential hypertension, malignant 04/19/2016  . Cytotoxic brain edema (HCC) 04/19/2016  . IVH (intraventricular hemorrhage) (HCC) 04/18/2016  . Hepatitis C     Past Surgical History:  Procedure Laterality Date  . ABDOMINAL HYSTERECTOMY    . KIDNEY STONE SURGERY Right 02/12/12  . KIDNEY STONE SURGERY Left 07/15/2012  . LIGATION OF ARTERIOVENOUS  FISTULA Left 06/21/2016   Procedure: LIGATION OF ARTERIOVENOUS  FISTULA ( LIGATION BASILIC VEIN );  Surgeon: Renford Dills, MD;  Location: [facility] ORS;  Service: Vascular;  Laterality: Left;  . LITHOTRIPSY    . OOPHORECTOMY    . SINUS EXPLORATION      Prior to Admission medications   Medication Sig Start Date End Date Taking? Authorizing Provider  alprazolam Prudy Feeler) 2 MG tablet Take 2 mg by mouth daily as needed for anxiety.     Historical Provider, MD   amLODipine (NORVASC) 5 MG tablet Take 1 tablet (5 mg total) by mouth daily. 04/30/16   Lora Paula, MD  busPIRone (BUSPAR) 10 MG tablet Take 10 mg by mouth 3 (three) times daily.    Historical Provider, MD  butalbital-acetaminophen-caffeine (FIORICET, ESGIC) 50-325-40 MG tablet Take 1 tablet by mouth 2 (two) times daily as needed for headache.    Historical Provider, MD  Butalbital-APAP-Caffeine 50-300-40 MG CAPS  06/22/16   Historical Provider, MD  cloNIDine (CATAPRES) 0.1 MG tablet daily. 06/03/16   Historical Provider, MD  clopidogrel (PLAVIX) 75 MG tablet Take 1 tablet (75 mg total) by mouth daily. 06/27/16   Marvel Plan, MD  Diclofenac Sodium (PENNSAID) 2 % SOLN Place onto the skin 3 (three) times daily.    Historical Provider, MD  dicyclomine (BENTYL) 20 MG tablet Take 20 mg by mouth 2 (two) times daily.  07/26/15 07/25/16  Historical Provider, MD  DULoxetine (CYMBALTA) 60 MG capsule Take 60 mg by mouth daily.    Historical Provider, MD  glipiZIDE (GLUCOTROL) 5 MG tablet Take 1 tablet (5 mg total) by mouth 2 (two) times daily before a meal. 04/30/16   Lora Paula, MD  INVOKANA 300 MG TABS tablet  06/22/16   Historical Provider, MD  labetalol (NORMODYNE) 100 MG tablet Take 1 tablet (100 mg total) by mouth 3 (three) times daily. 04/30/16   Lora Paula, MD  levETIRAcetam (KEPPRA) 500 MG tablet Take 1 tablet (500 mg total) by mouth 2 (two) times  daily. 04/30/16   Lora PaulaJennifer T Krall, MD  losartan (COZAAR) 50 MG tablet Take 50 mg by mouth 2 (two) times daily.    Historical Provider, MD  metFORMIN (GLUCOPHAGE) 500 MG tablet Take 1,000 mg by mouth 2 (two) times daily with a meal.    Historical Provider, MD  metoprolol (LOPRESSOR) 50 MG tablet  05/23/16   Historical Provider, MD  OLANZapine zydis (ZYPREXA) 5 MG disintegrating tablet Take 1 tablet (5 mg total) by mouth at bedtime. 04/30/16   Lora PaulaJennifer T Krall, MD  omeprazole (PRILOSEC) 20 MG capsule Take 40 mg by mouth daily.    Historical  Provider, MD  oxyCODONE-acetaminophen (ROXICET) 5-325 MG tablet Take 1-2 tablets by mouth every 6 (six) hours as needed for moderate pain or severe pain. 06/21/16   Renford DillsGregory G Schnier, MD  zolpidem (AMBIEN) 10 MG tablet  05/29/16   Historical Provider, MD    Allergies  Allergen Reactions  . Aspirin Itching  . Buprenorphine Hcl Itching  . Codeine Other (See Comments)    Makes her feel like something is crawling under her skin. Can take, sometimes makes her itch  . Compazine Itching    "makes my skin crawl"  . Ioxaglate Itching  . Ivp Dye [Iodinated Diagnostic Agents] Itching    Makes her itch really bad. Had to get 2-3 shots of Benadryl when she was in the hospital.  . Metrizamide Itching  . Naproxen Itching  . Norco [Hydrocodone-Acetaminophen] Itching  . Penicillins Itching and Other (See Comments)    Makes her feel really uncomfortable. No other information available.  . Prochlorperazine Maleate Other (See Comments)    Compazine makes her skin crawl - needs 2-3 shots of Benadryl to get relief.  . Reglan [Metoclopramide] Itching and Other (See Comments)    Makes her skin crawl  . Tegretol [Carbamazepine] Itching and Other (See Comments)    Makes her feel like something is crawling under her skin.  . Toradol [Ketorolac Tromethamine] Itching  . Tramadol Itching    Family History  Problem Relation Age of Onset  . Heart failure Mother   . Diabetes Father   . Cancer Sister     Social History Social History  Substance Use Topics  . Smoking status: Current Every Day Smoker    Packs/day: 0.25    Years: 1.50    Types: Cigarettes  . Smokeless tobacco: Never Used  . Alcohol use No    Review of Systems Constitutional: Subjective fever Cardiovascular: Negative for chest pain. Respiratory: Negative for shortness of breath. Gastrointestinal: Right-sided abdominal pain, lower abdominal pain. Positive for intermittent nausea and vomiting. Negative for diarrhea. Genitourinary:  Significant dysuria. Cloudy urine. Musculoskeletal: Negative for back pain Neurological: Negative for headache 10-point ROS otherwise negative.  ____________________________________________   PHYSICAL EXAM:  VITAL SIGNS: ED Triage Vitals  Enc Vitals Group     BP 07/29/16 1757 121/72     Pulse Rate 07/29/16 1757 66     Resp 07/29/16 1757 18     Temp 07/29/16 1757 98 F (36.7 C)     Temp Source 07/29/16 1757 Oral     SpO2 07/29/16 1757 100 %     Weight 07/29/16 1759 178 lb (80.7 kg)     Height 07/29/16 1759 5\' 3"  (1.6 m)     Head Circumference --      Peak Flow --      Pain Score 07/29/16 1802 9     Pain Loc --      Pain  Edu? --      Excl. in GC? --     Constitutional: Alert and oriented. Well appearing and in no distress. Eyes: Normal exam ENT   Head: Normocephalic and atraumatic.   Mouth/Throat: Mucous membranes are moist. Cardiovascular: Normal rate, regular rhythm. No murmur Respiratory: Normal respiratory effort without tachypnea nor retractions. Breath sounds are clear Gastrointestinal: Soft, mild right-sided tenderness to palpation, moderate suprapubic tenderness palpation. No rebound or guarding. No distention. Musculoskeletal: Nontender with normal range of motion in all extremities.  Neurologic:  Normal speech and language. No gross focal neurologic deficits  Skin:  Skin is warm, dry and intact.  Psychiatric: Mood and affect are normal. Speech and behavior are normal.   ____________________________________________     RADIOLOGY  CT shows renal stones but no ureteral lithiasis. Normal appendix.  ____________________________________________   INITIAL IMPRESSION / ASSESSMENT AND PLAN / ED COURSE  Pertinent labs & imaging results that were available during my care of the patient were reviewed by me and considered in my medical decision making (see chart for details).  The patient presents the emergency department 2 weeks of lower abdominal pain and  dysuria. Patient has a significant urinary tract infection on urinalysis with too numerous to count white blood cells, and many bacteria. We will dose Rocephin, pain and nausea medication, IV fluids. Patient's blood work is largely within normal limits including a normal white blood cell count. However given the patient's significant history of kidney stones in the past we will obtain a CT renal scan to rule out ureteral lithiasis associated with urinary tract infection.  CT negative for ureteral stone. Patient's workup consistent with significant urinary tract infection. Urine culture has been added on to the patient's urinalysis. We will discharge with Keflex 3 times a day for the next 7 days. Patient will follow up with her primary care doctor. We'll also discharge her pain and nausea medication.  ____________________________________________   FINAL CLINICAL IMPRESSION(S) / ED DIAGNOSES  Urinary tract infection Abdominal pain    Minna AntisKevin Curt Oatis, MD 07/29/16 2052

## 2016-07-29 NOTE — ED Notes (Signed)
Pt states pain no improvement Dr. Lenard LancePaduchowski made aware.

## 2016-07-29 NOTE — ED Notes (Signed)
ED Provider at bedside. 

## 2016-07-29 NOTE — ED Triage Notes (Signed)
Pt has abd pain for 2 weeks.  Pt reports n/v.  Hx of kidney stones.  Pt reports dysuria.  Pt also has back pain .  Pt alert.

## 2016-07-29 NOTE — ED Notes (Signed)
Discharge instructions reviewed with patient. Questions fielded by this RN. Patient verbalizes understanding of instructions. Patient discharged home in stable condition per Paduchowski MD . No acute distress noted at time of discharge.   

## 2016-08-01 LAB — URINE CULTURE

## 2016-08-02 ENCOUNTER — Emergency Department: Payer: 59

## 2016-08-02 ENCOUNTER — Emergency Department
Admission: EM | Admit: 2016-08-02 | Discharge: 2016-08-02 | Disposition: A | Discharge status: Home / Self Care | Payer: 59 | Attending: Emergency Medicine | Admitting: Emergency Medicine

## 2016-08-02 ENCOUNTER — Encounter: Payer: Self-pay | Admitting: Emergency Medicine

## 2016-08-02 DIAGNOSIS — Z7984 Long term (current) use of oral hypoglycemic drugs: Secondary | ICD-10-CM | POA: Diagnosis not present

## 2016-08-02 DIAGNOSIS — Z79899 Other long term (current) drug therapy: Secondary | ICD-10-CM | POA: Insufficient documentation

## 2016-08-02 DIAGNOSIS — I10 Essential (primary) hypertension: Secondary | ICD-10-CM | POA: Insufficient documentation

## 2016-08-02 DIAGNOSIS — R197 Diarrhea, unspecified: Secondary | ICD-10-CM | POA: Insufficient documentation

## 2016-08-02 DIAGNOSIS — R1110 Vomiting, unspecified: Secondary | ICD-10-CM

## 2016-08-02 DIAGNOSIS — F17210 Nicotine dependence, cigarettes, uncomplicated: Secondary | ICD-10-CM | POA: Insufficient documentation

## 2016-08-02 DIAGNOSIS — N3000 Acute cystitis without hematuria: Secondary | ICD-10-CM | POA: Diagnosis not present

## 2016-08-02 DIAGNOSIS — E119 Type 2 diabetes mellitus without complications: Secondary | ICD-10-CM | POA: Diagnosis not present

## 2016-08-02 DIAGNOSIS — R112 Nausea with vomiting, unspecified: Secondary | ICD-10-CM | POA: Diagnosis present

## 2016-08-02 DIAGNOSIS — R1084 Generalized abdominal pain: Secondary | ICD-10-CM

## 2016-08-02 LAB — URINALYSIS COMPLETE WITH MICROSCOPIC (ARMC ONLY)
Bilirubin Urine: NEGATIVE
Glucose, UA: >500 mg/dL — AB
Hgb urine dipstick: NEGATIVE
Ketones, ur: NEGATIVE mg/dL
Nitrite: NEGATIVE
PH: 7 (ref 5.0–8.0)
PROTEIN: NEGATIVE mg/dL
RBC / HPF: NONE SEEN RBC/hpf (ref 0–5)
Specific Gravity, Urine: 1.001 — ABNORMAL LOW (ref 1.005–1.030)

## 2016-08-02 LAB — CBC
HEMATOCRIT: 46.5 % (ref 35.0–47.0)
Hemoglobin: 15.8 g/dL (ref 12.0–16.0)
MCH: 31.9 pg (ref 26.0–34.0)
MCHC: 34 g/dL (ref 32.0–36.0)
MCV: 93.6 fL (ref 80.0–100.0)
Platelets: 174 10*3/uL (ref 150–440)
RBC: 4.97 MIL/uL (ref 3.80–5.20)
RDW: 14.1 % (ref 11.5–14.5)
WBC: 7.8 10*3/uL (ref 3.6–11.0)

## 2016-08-02 LAB — LIPASE, BLOOD: LIPASE: 29 U/L (ref 11–51)

## 2016-08-02 LAB — GLUCOSE, CAPILLARY: GLUCOSE-CAPILLARY: 94 mg/dL (ref 65–99)

## 2016-08-02 MED ORDER — HYDROMORPHONE HCL 1 MG/ML IJ SOLN
1.0000 mg | Freq: Once | INTRAMUSCULAR | Status: AC
  Administered 2016-08-02: 1 mg via INTRAMUSCULAR
  Filled 2016-08-02: qty 1

## 2016-08-02 MED ORDER — NITROFURANTOIN MONOHYD MACRO 100 MG PO CAPS
100.0000 mg | ORAL_CAPSULE | Freq: Once | ORAL | Status: AC
  Administered 2016-08-02: 100 mg via ORAL
  Filled 2016-08-02: qty 1

## 2016-08-02 MED ORDER — PROMETHAZINE HCL 25 MG/ML IJ SOLN
50.0000 mg | Freq: Four times a day (QID) | INTRAMUSCULAR | Status: DC | PRN
  Administered 2016-08-02: 50 mg via INTRAMUSCULAR
  Filled 2016-08-02: qty 2

## 2016-08-02 MED ORDER — PROMETHAZINE HCL 25 MG PO TABS: 25.0000 mg | ORAL_TABLET | Freq: Four times a day (QID) | ORAL | 0 refills | Status: DC | PRN

## 2016-08-02 MED ORDER — NITROFURANTOIN MONOHYD MACRO 100 MG PO CAPS
100.0000 mg | ORAL_CAPSULE | Freq: Two times a day (BID) | ORAL | 0 refills | Status: DC
Start: 2016-08-02 — End: 2016-09-02

## 2016-08-02 NOTE — Progress Notes (Signed)
ED Culture Results: Patient seen in ED on 07/29/16. Urine culture with >100,000 cfu E.coli that is ESBL positive. Patient was disharged from ED on 07/29/16 with Rx for Keflex 500mg  tid x 7 days. Patient is now back in ED with abdominal pain and nausea. Reported culture results to attending MD and he will follow up.  Clovia CuffLisa Loletta Harper, PharmD, BCPS 08/02/2016 3:08 PM

## 2016-08-02 NOTE — ED Provider Notes (Signed)
[facility] Emergency Department Provider Note        Time seen: ----------------------------------------- 2:50 PM on 08/02/2016 -----------------------------------------    I have reviewed the triage vital signs and the nursing notes.   HISTORY  Chief Complaint Abdominal Pain    HPI Stephanie [name] is a 50 y.o. female who presents to the ER for evaluation of abdominal pain with vomiting and diarrhea. Patient states she was here Monday and diagnosed with UTI. Patient reports the pain has moved to the mid abdomen and states nausea and vomiting has started. She reports Zofran has not been helping her symptoms and a recent fever.   Past Medical History:  Diagnosis Date  . Diabetes mellitus without complication (HCC)   . Diverticulitis   . Hepatitis C   . Hypertension   . Kidney stones   . Sciatica   . Stroke [facility](HCC)     Patient Active Problem List   Diagnosis Date Noted  . Pain and swelling of left upper extremity 07/22/2016  . Chronic thrombosis of left basilic vein 07/22/2016  . ICH (intracerebral hemorrhage) (HCC)   . Essential hypertension, malignant 04/19/2016  . Cytotoxic brain edema (HCC) 04/19/2016  . IVH (intraventricular hemorrhage) (HCC) 04/18/2016  . Hepatitis C     Past Surgical History:  Procedure Laterality Date  . ABDOMINAL HYSTERECTOMY    . KIDNEY STONE SURGERY Right 02/12/12  . KIDNEY STONE SURGERY Left 07/15/2012  . LIGATION OF ARTERIOVENOUS  FISTULA Left 06/21/2016   Procedure: LIGATION OF ARTERIOVENOUS  FISTULA ( LIGATION BASILIC VEIN );  Surgeon: Renford DillsGregory G Schnier, MD;  Location: [facility] ORS;  Service: Vascular;  Laterality: Left;  . LITHOTRIPSY    . OOPHORECTOMY    . SINUS EXPLORATION      Allergies Aspirin; Buprenorphine hcl; Codeine; Compazine; Ioxaglate; Ivp dye [iodinated diagnostic agents]; Metrizamide; Naproxen; Norco [hydrocodone-acetaminophen]; Penicillins; Prochlorperazine maleate; Reglan [metoclopramide];  Tegretol [carbamazepine]; Toradol [ketorolac tromethamine]; and Tramadol  Social History Social History  Substance Use Topics  . Smoking status: Current Every Day Smoker    Packs/day: 0.25    Years: 1.50    Types: Cigarettes  . Smokeless tobacco: Never Used  . Alcohol use No    Review of Systems Constitutional: Positive for fever Cardiovascular: Negative for chest pain. Respiratory: Negative for shortness of breath. Gastrointestinal: Positive for abdominal pain, vomiting and diarrhea Genitourinary: Negative for dysuria. Musculoskeletal: Negative for back pain. Skin: Negative for rash. Neurological: Negative for headaches, focal weakness or numbness.  10-point ROS otherwise negative.  ____________________________________________   PHYSICAL EXAM:  VITAL SIGNS: ED Triage Vitals  Enc Vitals Group     BP 08/02/16 1338 120/76     Pulse Rate 08/02/16 1337 71     Resp 08/02/16 1337 18     Temp 08/02/16 1337 97.9 F (36.6 C)     Temp Source 08/02/16 1337 Oral     SpO2 08/02/16 1337 98 %     Weight 08/02/16 1337 187 lb (84.8 kg)     Height 08/02/16 1337 5\' 2"  (1.575 m)     Head Circumference --      Peak Flow --      Pain Score 08/02/16 1337 9     Pain Loc --      Pain Edu? --      Excl. in GC? --     Constitutional: Alert and oriented. Well appearing and in no distress. Eyes: Conjunctivae are normal. PERRL. Normal extraocular movements. ENT   Head: Normocephalic and  atraumatic.   Nose: No congestion/rhinnorhea.   Mouth/Throat: Mucous membranes are moist.   Neck: No stridor. Cardiovascular: Normal rate, regular rhythm. No murmurs, rubs, or gallops. Respiratory: Normal respiratory effort without tachypnea nor retractions. Breath sounds are clear and equal bilaterally. No wheezes/rales/rhonchi. Gastrointestinal: Right upper quadrant tenderness, no rebound or guarding. Normal bowel sounds. Musculoskeletal: Nontender with normal range of motion in all  extremities. No lower extremity tenderness nor edema. Neurologic:  Normal speech and language. No gross focal neurologic deficits are appreciated.  Skin:  Skin is warm, dry and intact. No rash noted. Psychiatric: Mood and affect are normal. Speech and behavior are normal.  ___________________________________________  ED COURSE:  Pertinent labs & imaging results that were available during my care of the patient were reviewed by me and considered in my medical decision making (see chart for details). Clinical Course as of Aug 02 1622  Fri Aug 02, 2016  1515 Glucose: 85 [JW]    Clinical Course User Index [JW] Emily FilbertJonathan E Brayan Votaw, MD  Patient presents to the ER seemingly gastroenteritis likely unrelated to her recent UTI. We will reassess her urine, check basic labs and give antiemetics.  Procedures ____________________________________________   LABS (pertinent positives/negatives)  Labs Reviewed  COMPREHENSIVE METABOLIC PANEL - Abnormal; Notable for the following:       Result Value   Total Bilirubin <0.1 (*)    All other components within normal limits  URINALYSIS COMPLETEWITH MICROSCOPIC ([facility] ONLY) - Abnormal; Notable for the following:    Color, Urine STRAW (*)    APPearance CLEAR (*)    Glucose, UA >500 (*)    Specific Gravity, Urine 1.001 (*)    Leukocytes, UA 1+ (*)    Bacteria, UA RARE (*)    Squamous Epithelial / LPF 0-5 (*)    All other components within normal limits  LIPASE, BLOOD  CBC    RADIOLOGY  Right upper quadrant ultrasound Revealed hepatic steatosis ____________________________________________  FINAL ASSESSMENT AND PLAN  Abdominal pain, vomiting and diarrhea, UTI  Plan: Patient with labs and imaging as dictated above. Patient did have findings of ESBL that was improving markedly on Keflex which the ESBL was supposedly resistant to. She has been changed to Macrobid, ultrasound is unremarkable. I feel confident the vomiting and diarrhea is unrelated  to her UTI which is again improving. She is stable for outpatient follow-up.   Emily FilbertWilliams, Anddy Wingert E, MD   Note: This dictation was prepared with Dragon dictation. Any transcriptional errors that result from this process are unintentional    Emily FilbertJonathan E Tashon Capp, MD 08/02/16 [phone]

## 2016-08-02 NOTE — ED Triage Notes (Signed)
Pt seen here on Monday and dx with UTI, reports yesterday pain has moved to mid abdomen. Pt reports nausea and vomiting, reports zofran hasn't been working.

## 2016-08-04 LAB — COMPREHENSIVE METABOLIC PANEL
ALT: 18 U/L (ref 14–54)
AST: 23 U/L (ref 15–41)
Albumin: 4.1 g/dL (ref 3.5–5.0)
Alkaline Phosphatase: 99 U/L (ref 38–126)
Anion gap: 7 (ref 5–15)
BUN: 8 mg/dL (ref 6–20)
CHLORIDE: 106 mmol/L (ref 101–111)
CO2: 27 mmol/L (ref 22–32)
Calcium: 9.8 mg/dL (ref 8.9–10.3)
Creatinine, Ser: 0.57 mg/dL (ref 0.44–1.00)
Glucose, Bld: 85 mg/dL (ref 65–99)
POTASSIUM: 4 mmol/L (ref 3.5–5.1)
SODIUM: 140 mmol/L (ref 135–145)
Total Bilirubin: 0.5 mg/dL (ref 0.3–1.2)
Total Protein: 7.6 g/dL (ref 6.5–8.1)

## 2016-08-15 NOTE — Progress Notes (Signed)
[name]: Stephanie [name], female   DOB: 15-Dec-1965, 50 y.o.   MRN: [id number]  Chief Complaint  Patient presents with  . Follow-up    HPI Stephanie [name] is a 50 y.o. female.  The patient is s/p ligation of the left basilic vein at the confluence with the axillary vein on 06/21/16. Her incision is healing well. Still experiencing some swelling and pain to the left upper extremity. She underwent an upper extremity DVT study on 07/19/16 and was found to have a subacute superficial thrombus of the left basilic vein distal to the ligation from the elbow to the mid forearm.  She feels her arm pain and swelling is a little better but remains concerned that it is still bothering her   Past Medical History:  Diagnosis Date  . Diabetes mellitus without complication (HCC)   . Diverticulitis   . Hepatitis C   . Hypertension   . Kidney stones   . Sciatica   . Stroke [facility](HCC)     Past Surgical History:  Procedure Laterality Date  . ABDOMINAL HYSTERECTOMY    . KIDNEY STONE SURGERY Right 02/12/12  . KIDNEY STONE SURGERY Left 07/15/2012  . LIGATION OF ARTERIOVENOUS  FISTULA Left 06/21/2016   Procedure: LIGATION OF ARTERIOVENOUS  FISTULA ( LIGATION BASILIC VEIN );  Surgeon: Renford DillsGregory G Latrisa Hellums, MD;  Location: [facility] ORS;  Service: Vascular;  Laterality: Left;  . LITHOTRIPSY    . OOPHORECTOMY    . SINUS EXPLORATION        Allergies  Allergen Reactions  . Aspirin Itching  . Buprenorphine Hcl Itching  . Codeine Other (See Comments)    Makes her feel like something is crawling under her skin. Can take, sometimes makes her itch  . Compazine Itching    "makes my skin crawl"  . Ioxaglate Itching  . Ivp Dye [Iodinated Diagnostic Agents] Itching    Makes her itch really bad. Had to get 2-3 shots of Benadryl when she was in the hospital.  . Metrizamide Itching  . Naproxen Itching  . Norco [Hydrocodone-Acetaminophen] Itching  . Penicillins Itching and Other (See Comments)    Makes her feel  really uncomfortable. No other information available.  . Prochlorperazine Maleate Other (See Comments)    Compazine makes her skin crawl - needs 2-3 shots of Benadryl to get relief.  . Reglan [Metoclopramide] Itching and Other (See Comments)    Makes her skin crawl  . Tegretol [Carbamazepine] Itching and Other (See Comments)    Makes her feel like something is crawling under her skin.  . Toradol [Ketorolac Tromethamine] Itching  . Tramadol Itching    Current Outpatient Prescriptions  Medication Sig Dispense Refill  . alprazolam (XANAX) 2 MG tablet Take 2 mg by mouth daily as needed for anxiety.     Marland Kitchen. amLODipine (NORVASC) 5 MG tablet Take 1 tablet (5 mg total) by mouth daily. 30 tablet 0  . busPIRone (BUSPAR) 10 MG tablet Take 10 mg by mouth 3 (three) times daily.    . butalbital-acetaminophen-caffeine (FIORICET, ESGIC) 50-325-40 MG tablet Take 1 tablet by mouth 2 (two) times daily as needed for headache.    . Butalbital-APAP-Caffeine 50-300-40 MG CAPS     . cloNIDine (CATAPRES) 0.1 MG tablet daily.    . clopidogrel (PLAVIX) 75 MG tablet Take 1 tablet (75 mg total) by mouth daily. 90 tablet 3  . Diclofenac Sodium (PENNSAID) 2 % SOLN Place onto the skin 3 (three) times daily.    .Marland Kitchen  dicyclomine (BENTYL) 20 MG tablet Take 20 mg by mouth 2 (two) times daily.     . DULoxetine (CYMBALTA) 60 MG capsule Take 60 mg by mouth daily.    Marland Kitchen. glipiZIDE (GLUCOTROL) 5 MG tablet Take 1 tablet (5 mg total) by mouth 2 (two) times daily before a meal. 60 tablet 0  . INVOKANA 300 MG TABS tablet     . labetalol (NORMODYNE) 100 MG tablet Take 1 tablet (100 mg total) by mouth 3 (three) times daily. 90 tablet 0  . levETIRAcetam (KEPPRA) 500 MG tablet Take 1 tablet (500 mg total) by mouth 2 (two) times daily. 60 tablet 0  . losartan (COZAAR) 50 MG tablet Take 50 mg by mouth 2 (two) times daily.    . metFORMIN (GLUCOPHAGE) 500 MG tablet Take 1,000 mg by mouth 2 (two) times daily with a meal.    . metoprolol  (LOPRESSOR) 50 MG tablet     . OLANZapine zydis (ZYPREXA) 5 MG disintegrating tablet Take 1 tablet (5 mg total) by mouth at bedtime. 30 tablet 0  . omeprazole (PRILOSEC) 20 MG capsule Take 40 mg by mouth daily.    Marland Kitchen. zolpidem (AMBIEN) 10 MG tablet     . cephALEXin (KEFLEX) 500 MG capsule Take 1 capsule (500 mg total) by mouth 3 (three) times daily. 21 capsule 0  . nitrofurantoin, macrocrystal-monohydrate, (MACROBID) 100 MG capsule Take 1 capsule (100 mg total) by mouth 2 (two) times daily. 20 capsule 0  . ondansetron (ZOFRAN ODT) 4 MG disintegrating tablet Take 1 tablet (4 mg total) by mouth every 8 (eight) hours as needed for nausea or vomiting. 20 tablet 0  . oxyCODONE-acetaminophen (ROXICET) 5-325 MG tablet Take 1 tablet by mouth every 6 (six) hours as needed. 12 tablet 0  . promethazine (PHENERGAN) 25 MG tablet Take 1 tablet (25 mg total) by mouth every 6 (six) hours as needed for nausea or vomiting. 20 tablet 0   No current facility-administered medications for this visit.         Physical Exam BP 120/78   Pulse 68   Resp 17   Ht 5\' 2"  (1.575 m)   Wt 189 lb (85.7 kg)   BMI 34.57 kg/m  Gen:  WD/WN, NAD Skin: incision C/D/I Left Arm:  Edema still present and palpable cord over the basilic vein noted     Assessment/Plan:  S/P ligation of the proximal basilic vein to prevent DVT  Continue pain medication for now as she is still having significant pain.  Continue elevation.   Follow up in 2 weeks     Levora DredgeGregory Alegandro Macnaughton 08/15/2016, 1:27 PM   This note was created with Dragon medical transcription system.  Any errors from dictation are unintentional.

## 2016-08-16 ENCOUNTER — Ambulatory Visit: Payer: Self-pay | Admitting: Urology

## 2016-08-16 ENCOUNTER — Encounter: Payer: Self-pay | Admitting: Urology

## 2016-08-29 ENCOUNTER — Telehealth: Payer: Self-pay | Admitting: Neurology

## 2016-08-29 NOTE — Telephone Encounter (Signed)
Patient requesting Rx called in for headaches. Fioricet is not helping. Patient uses [facility] on [address] in [place].

## 2016-08-30 NOTE — Telephone Encounter (Signed)
Called pt and left VM for her to call back. She supposed to have CT, CTA and CTV done but seems not scheduled. She needs more work up for her HA.   Stephanie [name] Audreanna Torrisi, MD PhD Stroke Neurology 08/30/2016 1:26 PM

## 2016-09-01 ENCOUNTER — Inpatient Hospital Stay
Admission: EM | Admit: 2016-09-01 | Discharge: 2016-09-02 | DRG: 390 | Disposition: A | Discharge status: Home / Self Care | Payer: 59 | Attending: Surgery | Admitting: Surgery

## 2016-09-01 ENCOUNTER — Emergency Department: Payer: 59

## 2016-09-01 DIAGNOSIS — E119 Type 2 diabetes mellitus without complications: Secondary | ICD-10-CM | POA: Diagnosis present

## 2016-09-01 DIAGNOSIS — Z8673 Personal history of transient ischemic attack (TIA), and cerebral infarction without residual deficits: Secondary | ICD-10-CM

## 2016-09-01 DIAGNOSIS — K5651 Intestinal adhesions [bands], with partial obstruction: Principal | ICD-10-CM | POA: Diagnosis present

## 2016-09-01 DIAGNOSIS — Z79899 Other long term (current) drug therapy: Secondary | ICD-10-CM

## 2016-09-01 DIAGNOSIS — F17210 Nicotine dependence, cigarettes, uncomplicated: Secondary | ICD-10-CM | POA: Diagnosis present

## 2016-09-01 DIAGNOSIS — Z7984 Long term (current) use of oral hypoglycemic drugs: Secondary | ICD-10-CM

## 2016-09-01 DIAGNOSIS — Z8249 Family history of ischemic heart disease and other diseases of the circulatory system: Secondary | ICD-10-CM

## 2016-09-01 DIAGNOSIS — I10 Essential (primary) hypertension: Secondary | ICD-10-CM | POA: Diagnosis present

## 2016-09-01 DIAGNOSIS — Z833 Family history of diabetes mellitus: Secondary | ICD-10-CM

## 2016-09-01 DIAGNOSIS — Z4659 Encounter for fitting and adjustment of other gastrointestinal appliance and device: Secondary | ICD-10-CM

## 2016-09-01 DIAGNOSIS — Z90710 Acquired absence of both cervix and uterus: Secondary | ICD-10-CM

## 2016-09-01 DIAGNOSIS — K56609 Unspecified intestinal obstruction, unspecified as to partial versus complete obstruction: Secondary | ICD-10-CM | POA: Diagnosis present

## 2016-09-01 DIAGNOSIS — Z87442 Personal history of urinary calculi: Secondary | ICD-10-CM

## 2016-09-01 DIAGNOSIS — R112 Nausea with vomiting, unspecified: Secondary | ICD-10-CM | POA: Diagnosis not present

## 2016-09-01 HISTORY — DX: Irritable bowel syndrome, unspecified: K58.9

## 2016-09-01 LAB — COMPREHENSIVE METABOLIC PANEL
ALBUMIN: 4.6 g/dL (ref 3.5–5.0)
ALT: 18 U/L (ref 14–54)
AST: 23 U/L (ref 15–41)
Alkaline Phosphatase: 118 U/L (ref 38–126)
Anion gap: 11 (ref 5–15)
BILIRUBIN TOTAL: 0.9 mg/dL (ref 0.3–1.2)
BUN: 12 mg/dL (ref 6–20)
CO2: 25 mmol/L (ref 22–32)
Calcium: 9.8 mg/dL (ref 8.9–10.3)
Chloride: 103 mmol/L (ref 101–111)
Creatinine, Ser: 0.62 mg/dL (ref 0.44–1.00)
GFR calc Af Amer: >60 mL/min (ref >60)
GFR calc non Af Amer: >60 mL/min (ref >60)
GLUCOSE: 159 mg/dL — AB (ref 65–99)
POTASSIUM: 4 mmol/L (ref 3.5–5.1)
Sodium: 139 mmol/L (ref 135–145)
TOTAL PROTEIN: 8.2 g/dL — AB (ref 6.5–8.1)

## 2016-09-01 LAB — CBC
HEMATOCRIT: 49.5 % — AB (ref 35.0–47.0)
Hemoglobin: 16.6 g/dL — ABNORMAL HIGH (ref 12.0–16.0)
MCH: 31.8 pg (ref 26.0–34.0)
MCHC: 33.6 g/dL (ref 32.0–36.0)
MCV: 94.8 fL (ref 80.0–100.0)
Platelets: 193 10*3/uL (ref 150–440)
RBC: 5.22 MIL/uL — ABNORMAL HIGH (ref 3.80–5.20)
RDW: 14.1 % (ref 11.5–14.5)
WBC: 13.8 10*3/uL — ABNORMAL HIGH (ref 3.6–11.0)

## 2016-09-01 LAB — LIPASE, BLOOD: Lipase: 17 U/L (ref 11–51)

## 2016-09-01 MED ORDER — MORPHINE SULFATE (PF) 4 MG/ML IV SOLN
4.0000 mg | Freq: Once | INTRAVENOUS | Status: AC
  Administered 2016-09-01: 4 mg via INTRAVENOUS
  Filled 2016-09-01: qty 1

## 2016-09-01 MED ORDER — ONDANSETRON HCL 4 MG/2ML IJ SOLN
4.0000 mg | Freq: Once | INTRAMUSCULAR | Status: AC
  Administered 2016-09-01: 4 mg via INTRAVENOUS
  Filled 2016-09-01: qty 2

## 2016-09-01 MED ORDER — MORPHINE SULFATE (PF) 4 MG/ML IV SOLN
INTRAVENOUS | Status: AC
  Administered 2016-09-01: 4 mg via INTRAVENOUS
  Filled 2016-09-01: qty 1

## 2016-09-01 MED ORDER — BARIUM SULFATE 2.1 % PO SUSP: 450.0000 mL | ORAL | Status: AC

## 2016-09-01 MED ORDER — SODIUM CHLORIDE 0.9 % IV BOLUS (SEPSIS)
1000.0000 mL | Freq: Once | INTRAVENOUS | Status: AC
Start: 2016-09-01 — End: 2016-09-02
  Administered 2016-09-01: 1000 mL via INTRAVENOUS

## 2016-09-01 MED ORDER — MORPHINE SULFATE (PF) 4 MG/ML IV SOLN
4.0000 mg | Freq: Once | INTRAVENOUS | Status: AC
  Administered 2016-09-01: 4 mg via INTRAVENOUS

## 2016-09-01 NOTE — ED Notes (Signed)
Pt c/o 4-5 days rated at 8 out of 10. Pt reports previous hx of hemmoragic stroke in August. Pt has CT scheduled for 12/28 in regards to headache/stroke hx.

## 2016-09-01 NOTE — ED Notes (Signed)
Patient was given Zofran by EMS

## 2016-09-01 NOTE — ED Provider Notes (Signed)
[facility] Emergency Department Provider Note   ____________________________________________   I have reviewed the triage vital signs and the nursing notes.   HISTORY  Chief Complaint Abdominal Pain   History limited by: Not Limited   HPI Stephanie [name] is a 50 y.o. female who presents to the emergency department today because of concern for abdominal pain. The pain started today. It is located in the center and epigastric regions. It is severe. It has been accompanied by both nausea, vomiting and diarrhea. The patient has not had any fevers. The patient has had problems with IBS in the past. States she was also told she has diverticulitis but has not had any attacks of it. No fevers. No unusual ingestions.   Past Medical History:  Diagnosis Date  . Diabetes mellitus without complication (HCC)   . Diverticulitis   . Hepatitis C   . Hypertension   . IBS (irritable bowel syndrome)   . Kidney stones   . Sciatica   . Stroke [facility](HCC)     Patient Active Problem List   Diagnosis Date Noted  . Pain and swelling of left upper extremity 07/22/2016  . Chronic thrombosis of left basilic vein 07/22/2016  . ICH (intracerebral hemorrhage) (HCC)   . Essential hypertension, malignant 04/19/2016  . Cytotoxic brain edema (HCC) 04/19/2016  . IVH (intraventricular hemorrhage) (HCC) 04/18/2016  . Hepatitis C     Past Surgical History:  Procedure Laterality Date  . ABDOMINAL HYSTERECTOMY    . KIDNEY STONE SURGERY Right 02/12/12  . KIDNEY STONE SURGERY Left 07/15/2012  . LIGATION OF ARTERIOVENOUS  FISTULA Left 06/21/2016   Procedure: LIGATION OF ARTERIOVENOUS  FISTULA ( LIGATION BASILIC VEIN );  Surgeon: Renford DillsGregory G Schnier, MD;  Location: [facility] ORS;  Service: Vascular;  Laterality: Left;  . LITHOTRIPSY    . OOPHORECTOMY    . SINUS EXPLORATION      Prior to Admission medications   Medication Sig Start Date End Date Taking? Authorizing Provider  alprazolam Prudy Feeler(XANAX)  2 MG tablet Take 2 mg by mouth daily as needed for anxiety.     Historical Provider, MD  amLODipine (NORVASC) 5 MG tablet Take 1 tablet (5 mg total) by mouth daily. 04/30/16   Lora PaulaJennifer T Krall, MD  busPIRone (BUSPAR) 10 MG tablet Take 10 mg by mouth 3 (three) times daily.    Historical Provider, MD  butalbital-acetaminophen-caffeine (FIORICET, ESGIC) 50-325-40 MG tablet Take 1 tablet by mouth 2 (two) times daily as needed for headache.    Historical Provider, MD  Butalbital-APAP-Caffeine 50-300-40 MG CAPS  06/22/16   Historical Provider, MD  cephALEXin (KEFLEX) 500 MG capsule Take 1 capsule (500 mg total) by mouth 3 (three) times daily. 07/29/16   Minna AntisKevin Paduchowski, MD  cloNIDine (CATAPRES) 0.1 MG tablet daily. 06/03/16   Historical Provider, MD  clopidogrel (PLAVIX) 75 MG tablet Take 1 tablet (75 mg total) by mouth daily. 06/27/16   Marvel PlanJindong Xu, MD  Diclofenac Sodium (PENNSAID) 2 % SOLN Place onto the skin 3 (three) times daily.    Historical Provider, MD  dicyclomine (BENTYL) 20 MG tablet Take 20 mg by mouth 2 (two) times daily.  07/26/15 07/25/16  Historical Provider, MD  DULoxetine (CYMBALTA) 60 MG capsule Take 60 mg by mouth daily.    Historical Provider, MD  glipiZIDE (GLUCOTROL) 5 MG tablet Take 1 tablet (5 mg total) by mouth 2 (two) times daily before a meal. 04/30/16   Lora PaulaJennifer T Krall, MD  INVOKANA 300 MG TABS tablet  06/22/16   Historical Provider, MD  labetalol (NORMODYNE) 100 MG tablet Take 1 tablet (100 mg total) by mouth 3 (three) times daily. 04/30/16   Lora PaulaJennifer T Krall, MD  levETIRAcetam (KEPPRA) 500 MG tablet Take 1 tablet (500 mg total) by mouth 2 (two) times daily. 04/30/16   Lora PaulaJennifer T Krall, MD  losartan (COZAAR) 50 MG tablet Take 50 mg by mouth 2 (two) times daily.    Historical Provider, MD  metFORMIN (GLUCOPHAGE) 500 MG tablet Take 1,000 mg by mouth 2 (two) times daily with a meal.    Historical Provider, MD  metoprolol (LOPRESSOR) 50 MG tablet  05/23/16   Historical Provider, MD   nitrofurantoin, macrocrystal-monohydrate, (MACROBID) 100 MG capsule Take 1 capsule (100 mg total) by mouth 2 (two) times daily. 08/02/16   Emily FilbertJonathan E Williams, MD  OLANZapine zydis (ZYPREXA) 5 MG disintegrating tablet Take 1 tablet (5 mg total) by mouth at bedtime. 04/30/16   Lora PaulaJennifer T Krall, MD  omeprazole (PRILOSEC) 20 MG capsule Take 40 mg by mouth daily.    Historical Provider, MD  ondansetron (ZOFRAN ODT) 4 MG disintegrating tablet Take 1 tablet (4 mg total) by mouth every 8 (eight) hours as needed for nausea or vomiting. 07/29/16   Minna AntisKevin Paduchowski, MD  oxyCODONE-acetaminophen (ROXICET) 5-325 MG tablet Take 1 tablet by mouth every 6 (six) hours as needed. 07/29/16   Minna AntisKevin Paduchowski, MD  promethazine (PHENERGAN) 25 MG tablet Take 1 tablet (25 mg total) by mouth every 6 (six) hours as needed for nausea or vomiting. 08/02/16   Emily FilbertJonathan E Williams, MD  zolpidem Remus Loffler(AMBIEN) 10 MG tablet  05/29/16   Historical Provider, MD    Allergies Aspirin; Buprenorphine hcl; Codeine; Compazine; Ioxaglate; Ivp dye [iodinated diagnostic agents]; Metrizamide; Naproxen; Norco [hydrocodone-acetaminophen]; Penicillins; Prochlorperazine maleate; Reglan [metoclopramide]; Tegretol [carbamazepine]; Toradol [ketorolac tromethamine]; and Tramadol  Family History  Problem Relation Age of Onset  . Heart failure Mother   . Diabetes Father   . Cancer Sister     Social History Social History  Substance Use Topics  . Smoking status: Current Every Day Smoker    Packs/day: 0.25    Years: 1.50    Types: Cigarettes  . Smokeless tobacco: Never Used  . Alcohol use No    Review of Systems  Constitutional: Negative for fever. Cardiovascular: Negative for chest pain. Respiratory: Negative for shortness of breath. Gastrointestinal: Positive for abdominal pain, nausea, vomiting and diarrhea. Genitourinary: Negative for dysuria. Musculoskeletal: Negative for back pain. Skin: Negative for rash. Neurological: Negative  for headaches, focal weakness or numbness.  10-point ROS otherwise negative.  ____________________________________________   PHYSICAL EXAM:  VITAL SIGNS: ED Triage Vitals  Enc Vitals Group     BP 09/01/16 2005 (!) 125/94     Pulse Rate 09/01/16 2005 78     Resp 09/01/16 2005 18     Temp 09/01/16 2005 97.8 F (36.6 C)     Temp Source 09/01/16 2005 Oral     SpO2 09/01/16 2005 98 %     Weight 09/01/16 1954 193 lb 3.2 oz (87.6 kg)     Height 09/01/16 1954 5\' 2"  (1.575 m)     Head Circumference --      Peak Flow --      Pain Score 09/01/16 1954 9   Constitutional: Alert and oriented. Appears uncomfortable.  Eyes: Conjunctivae are normal. Normal extraocular movements. ENT   Head: Normocephalic and atraumatic.   Nose: No congestion/rhinnorhea.   Mouth/Throat: Mucous membranes are moist.   Neck:  No stridor. Hematological/Lymphatic/Immunilogical: No cervical lymphadenopathy. Cardiovascular: Normal rate, regular rhythm.  No murmurs, rubs, or gallops.  Respiratory: Normal respiratory effort without tachypnea nor retractions. Breath sounds are clear and equal bilaterally. No wheezes/rales/rhonchi. Gastrointestinal: Soft, non distended. Tender to palpation in the periumbilical area Genitourinary: Deferred Musculoskeletal: Normal range of motion in all extremities. No lower extremity edema. Neurologic:  Normal speech and language. No gross focal neurologic deficits are appreciated.  Skin:  Skin is warm, dry and intact. No rash noted. Psychiatric: Mood and affect are normal. Speech and behavior are normal. Patient exhibits appropriate insight and judgment.  ____________________________________________    LABS (pertinent positives/negatives)  Labs Reviewed  COMPREHENSIVE METABOLIC PANEL - Abnormal; Notable for the following:       Result Value   Glucose, Bld 159 (*)    Total Protein 8.2 (*)    All other components within normal limits  CBC - Abnormal; Notable for the  following:    WBC 13.8 (*)    RBC 5.22 (*)    Hemoglobin 16.6 (*)    HCT 49.5 (*)    All other components within normal limits  LIPASE, BLOOD  URINALYSIS, COMPLETE (UACMP) WITH MICROSCOPIC    ____________________________________________   EKG  I, Phineas Semen, attending physician, personally viewed and interpreted this EKG  EKG Time: 1958 Rate: 74 Rhythm: normal sinus rhythm Axis: normal Intervals: qtc 415 QRS: narrow, q wave V1-V3 ST changes: no st elevation Impression: abnormal ekg   ____________________________________________    RADIOLOGY  CT abd/pel pending   ____________________________________________   PROCEDURES  Procedures  ____________________________________________   INITIAL IMPRESSION / ASSESSMENT AND PLAN / ED COURSE  Pertinent labs & imaging results that were available during my care of the patient were reviewed by me and considered in my medical decision making (see chart for details).  Patient presents to the emergency department today with abdominal pain. She describes it as severe. It is located in the periumbilical and upper abdomen. On exam patient is quite uncomfortable. Patient did have a leukocytosis on blood work. CT scan will be obtained. ____________________________________________   FINAL CLINICAL IMPRESSION(S) / ED DIAGNOSES  Abdominal pain  Note: This dictation was prepared with Dragon dictation. Any transcriptional errors that result from this process are unintentional     Phineas Semen, MD 09/01/16 2322

## 2016-09-01 NOTE — ED Triage Notes (Signed)
Per EMS: pt reports hx of diverticulitis and IBS; Patient c/o abdominal pain, N/V rated at 8 out of 10.

## 2016-09-02 ENCOUNTER — Inpatient Hospital Stay: Payer: 59

## 2016-09-02 DIAGNOSIS — Z8673 Personal history of transient ischemic attack (TIA), and cerebral infarction without residual deficits: Secondary | ICD-10-CM | POA: Diagnosis not present

## 2016-09-02 DIAGNOSIS — Z8249 Family history of ischemic heart disease and other diseases of the circulatory system: Secondary | ICD-10-CM | POA: Diagnosis not present

## 2016-09-02 DIAGNOSIS — Z79899 Other long term (current) drug therapy: Secondary | ICD-10-CM | POA: Diagnosis not present

## 2016-09-02 DIAGNOSIS — K56609 Unspecified intestinal obstruction, unspecified as to partial versus complete obstruction: Secondary | ICD-10-CM

## 2016-09-02 DIAGNOSIS — I10 Essential (primary) hypertension: Secondary | ICD-10-CM | POA: Diagnosis present

## 2016-09-02 DIAGNOSIS — Z7984 Long term (current) use of oral hypoglycemic drugs: Secondary | ICD-10-CM | POA: Diagnosis not present

## 2016-09-02 DIAGNOSIS — Z90710 Acquired absence of both cervix and uterus: Secondary | ICD-10-CM | POA: Diagnosis not present

## 2016-09-02 DIAGNOSIS — E119 Type 2 diabetes mellitus without complications: Secondary | ICD-10-CM | POA: Diagnosis present

## 2016-09-02 DIAGNOSIS — Z87442 Personal history of urinary calculi: Secondary | ICD-10-CM | POA: Diagnosis not present

## 2016-09-02 DIAGNOSIS — R112 Nausea with vomiting, unspecified: Secondary | ICD-10-CM | POA: Diagnosis present

## 2016-09-02 DIAGNOSIS — Z833 Family history of diabetes mellitus: Secondary | ICD-10-CM | POA: Diagnosis not present

## 2016-09-02 DIAGNOSIS — F17210 Nicotine dependence, cigarettes, uncomplicated: Secondary | ICD-10-CM | POA: Diagnosis present

## 2016-09-02 DIAGNOSIS — K5651 Intestinal adhesions [bands], with partial obstruction: Secondary | ICD-10-CM | POA: Diagnosis present

## 2016-09-02 LAB — BASIC METABOLIC PANEL
ANION GAP: 6 (ref 5–15)
BUN: 9 mg/dL (ref 6–20)
CO2: 25 mmol/L (ref 22–32)
Calcium: 8.9 mg/dL (ref 8.9–10.3)
Chloride: 108 mmol/L (ref 101–111)
Creatinine, Ser: 0.53 mg/dL (ref 0.44–1.00)
GFR calc non Af Amer: >60 mL/min (ref >60)
Glucose, Bld: 126 mg/dL — ABNORMAL HIGH (ref 65–99)
POTASSIUM: 3.8 mmol/L (ref 3.5–5.1)
Sodium: 139 mmol/L (ref 135–145)

## 2016-09-02 LAB — CBC WITH DIFFERENTIAL/PLATELET
BASOS ABS: 0 10*3/uL (ref 0–0.1)
BASOS PCT: 0 %
EOS ABS: 0.1 10*3/uL (ref 0–0.7)
EOS PCT: 1 %
HEMATOCRIT: 45.1 % (ref 35.0–47.0)
Hemoglobin: 15.4 g/dL (ref 12.0–16.0)
Lymphocytes Relative: 26 %
Lymphs Abs: 2.5 10*3/uL (ref 1.0–3.6)
MCH: 32.3 pg (ref 26.0–34.0)
MCHC: 34.1 g/dL (ref 32.0–36.0)
MCV: 94.7 fL (ref 80.0–100.0)
MONO ABS: 0.9 10*3/uL (ref 0.2–0.9)
MONOS PCT: 10 %
NEUTROS PCT: 63 %
Neutro Abs: 5.9 10*3/uL (ref 1.4–6.5)
PLATELETS: 159 10*3/uL (ref 150–440)
RBC: 4.76 MIL/uL (ref 3.80–5.20)
RDW: 13.8 % (ref 11.5–14.5)
WBC: 9.4 10*3/uL (ref 3.6–11.0)

## 2016-09-02 LAB — GLUCOSE, CAPILLARY
GLUCOSE-CAPILLARY: 139 mg/dL — AB (ref 65–99)
Glucose-Capillary: 119 mg/dL — ABNORMAL HIGH (ref 65–99)
Glucose-Capillary: 157 mg/dL — ABNORMAL HIGH (ref 65–99)

## 2016-09-02 LAB — MAGNESIUM: Magnesium: 2 mg/dL (ref 1.7–2.4)

## 2016-09-02 MED ORDER — BUTALBITAL-APAP-CAFFEINE 50-325-40 MG PO TABS: 1.0000 | ORAL_TABLET | Freq: Two times a day (BID) | ORAL | Status: DC | PRN

## 2016-09-02 MED ORDER — PANTOPRAZOLE SODIUM 40 MG IV SOLR
40.0000 mg | Freq: Every day | INTRAVENOUS | Status: DC
  Administered 2016-09-02: 40 mg via INTRAVENOUS
  Filled 2016-09-02: qty 40

## 2016-09-02 MED ORDER — LACTATED RINGERS IV SOLN
125.0000 mL/h | INTRAVENOUS | Status: DC
  Administered 2016-09-02 (×2): 125 mL/h via INTRAVENOUS

## 2016-09-02 MED ORDER — METOPROLOL TARTRATE 5 MG/5ML IV SOLN
5.0000 mg | Freq: Four times a day (QID) | INTRAVENOUS | Status: DC
  Administered 2016-09-02 (×2): 5 mg via INTRAVENOUS
  Filled 2016-09-02 (×2): qty 5

## 2016-09-02 MED ORDER — HYDROMORPHONE HCL 1 MG/ML IJ SOLN
0.5000 mg | INTRAMUSCULAR | Status: DC | PRN
  Administered 2016-09-02 (×2): 0.5 mg via INTRAVENOUS
  Filled 2016-09-02 (×3): qty 0.5

## 2016-09-02 MED ORDER — HYDRALAZINE HCL 20 MG/ML IJ SOLN: 10.0000 mg | Freq: Four times a day (QID) | INTRAMUSCULAR | Status: DC | PRN

## 2016-09-02 MED ORDER — HEPARIN SODIUM (PORCINE) 5000 UNIT/ML IJ SOLN
5000.0000 [IU] | Freq: Three times a day (TID) | INTRAMUSCULAR | Status: DC
  Administered 2016-09-02 (×2): 5000 [IU] via SUBCUTANEOUS
  Filled 2016-09-02 (×2): qty 1

## 2016-09-02 MED ORDER — ONDANSETRON 4 MG PO TBDP: 4.0000 mg | ORAL_TABLET | Freq: Four times a day (QID) | ORAL | Status: DC | PRN

## 2016-09-02 MED ORDER — INSULIN ASPART 100 UNIT/ML ~~LOC~~ SOLN
0.0000 [IU] | SUBCUTANEOUS | Status: DC
Start: 2016-09-02 — End: 2016-09-02
  Administered 2016-09-02: 3 [IU] via SUBCUTANEOUS
  Administered 2016-09-02: 2 [IU] via SUBCUTANEOUS
  Filled 2016-09-02: qty 3
  Filled 2016-09-02: qty 2

## 2016-09-02 MED ORDER — MORPHINE SULFATE (PF) 4 MG/ML IV SOLN
4.0000 mg | Freq: Once | INTRAVENOUS | Status: AC
  Administered 2016-09-02: 4 mg via INTRAVENOUS
  Filled 2016-09-02: qty 1

## 2016-09-02 MED ORDER — ONDANSETRON HCL 4 MG/2ML IJ SOLN: 4.0000 mg | Freq: Four times a day (QID) | INTRAMUSCULAR | Status: DC | PRN

## 2016-09-02 NOTE — ED Notes (Signed)
MD Brown at bedside.

## 2016-09-02 NOTE — H&P (Signed)
Date of Admission:  09/02/2016  Reason for Admission:  Small bowel obstruction  History of Present Illness: Stephanie [name] is a 50 y.o. female who presents with a 1 day history of abdominal pain, associated with nausea and vomiting. Patient reports that her pain started around noon time in the mid abdomen. She had had a loose bowel movement in the morning but otherwise denies any flatus during the day and any further bowel movements afterwards. Patient reports that her pain progressively got worse throughout the afternoon and that she presented to the emergency room for further evaluation. She has had 2 episodes of emesis at home and once in the ambulance on her way to the hospital. Currently still has abdominal pain and just received a dose of pain medication for this. Otherwise denies any fevers, chills, chest pain, shortness of breath, blood in the stool, dysuria, hematuria, or other issues.   Past Medical History: Past Medical History:  Diagnosis Date  . Diabetes mellitus without complication (HCC)   . Diverticulitis   . Hepatitis C   . Hypertension   . IBS (irritable bowel syndrome)   . Kidney stones   . Sciatica   . L parieto-occipital ICH stroke [facility])      Past Surgical History: Past Surgical History:  Procedure Laterality Date  . ABDOMINAL HYSTERECTOMY    . KIDNEY STONE SURGERY Right 02/12/12  . KIDNEY STONE SURGERY Left 07/15/2012  . LIGATION OF ARTERIOVENOUS  FISTULA Left 06/21/2016   Procedure: LIGATION OF ARTERIOVENOUS  FISTULA ( LIGATION BASILIC VEIN );  Surgeon: Renford Dills, MD;  Location: [facility] ORS;  Service: Vascular;  Laterality: Left;  . LITHOTRIPSY    . OOPHORECTOMY    . SINUS EXPLORATION      Home Medications: Prior to Admission medications   Medication Sig Start Date End Date Taking? Authorizing Provider  alprazolam Prudy Feeler) 2 MG tablet Take 2 mg by mouth daily as needed for anxiety.    Yes Historical Provider, MD  amLODipine (NORVASC) 10 MG tablet  Take 10 mg by mouth daily.   Yes Historical Provider, MD  atorvastatin (LIPITOR) 40 MG tablet Take 40 mg by mouth every evening.   Yes Historical Provider, MD  butalbital-acetaminophen-caffeine (FIORICET, ESGIC) 50-325-40 MG tablet Take 1 tablet by mouth 2 (two) times daily as needed for headache.   Yes Historical Provider, MD  cloNIDine (CATAPRES) 0.1 MG tablet Take 0.1 mg by mouth daily.    Yes Historical Provider, MD  clopidogrel (PLAVIX) 75 MG tablet Take 1 tablet (75 mg total) by mouth daily. 06/27/16  Yes Marvel Plan, MD  diphenoxylate-atropine (LOMOTIL) 2.5-0.025 MG tablet Take 1 tablet by mouth 4 (four) times daily as needed for diarrhea or loose stools.   Yes Historical Provider, MD  DULoxetine (CYMBALTA) 60 MG capsule Take 60 mg by mouth daily.   Yes Historical Provider, MD  glipiZIDE (GLUCOTROL) 5 MG tablet Take 1 tablet (5 mg total) by mouth 2 (two) times daily before a meal. 04/30/16  Yes Lora Paula, MD  hyoscyamine (LEVSIN SL) 0.125 MG SL tablet Place 0.125 mg under the tongue every 4 (four) hours as needed.   Yes Historical Provider, MD  INVOKANA 300 MG TABS tablet Take 300 mg by mouth daily before breakfast.    Yes Historical Provider, MD  levETIRAcetam (KEPPRA) 500 MG tablet Take 1 tablet (500 mg total) by mouth 2 (two) times daily. 04/30/16  Yes Lora Paula, MD  losartan (COZAAR) 50 MG tablet Take 50 mg  by mouth 2 (two) times daily.   Yes Historical Provider, MD  metoprolol (LOPRESSOR) 50 MG tablet Take 50 mg by mouth 2 (two) times daily.    Yes Historical Provider, MD  omeprazole (PRILOSEC) 20 MG capsule Take 40 mg by mouth daily.   Yes Historical Provider, MD  ondansetron (ZOFRAN ODT) 4 MG disintegrating tablet Take 1 tablet (4 mg total) by mouth every 8 (eight) hours as needed for nausea or vomiting. 07/29/16  Yes Minna Antis, MD  topiramate (TOPAMAX) 25 MG tablet Take 25 mg by mouth daily.   Yes Historical Provider, MD  zolpidem (AMBIEN) 10 MG tablet Take 10 mg by  mouth at bedtime as needed for sleep.    Yes Historical Provider, MD  busPIRone (BUSPAR) 10 MG tablet Take 10 mg by mouth 3 (three) times daily.    Historical Provider, MD  cephALEXin (KEFLEX) 500 MG capsule Take 1 capsule (500 mg total) by mouth 3 (three) times daily. Patient not taking: Reported on 09/02/2016 07/29/16   Minna Antis, MD  dicyclomine (BENTYL) 20 MG tablet Take 20 mg by mouth 2 (two) times daily.  07/26/15 07/25/16  Historical Provider, MD  labetalol (NORMODYNE) 100 MG tablet Take 1 tablet (100 mg total) by mouth 3 (three) times daily. 04/30/16   Lora Paula, MD  nitrofurantoin, macrocrystal-monohydrate, (MACROBID) 100 MG capsule Take 1 capsule (100 mg total) by mouth 2 (two) times daily. Patient not taking: Reported on 09/02/2016 08/02/16   Emily Filbert, MD  OLANZapine zydis (ZYPREXA) 5 MG disintegrating tablet Take 1 tablet (5 mg total) by mouth at bedtime. Patient not taking: Reported on 09/02/2016 04/30/16   Lora Paula, MD  oxyCODONE-acetaminophen (ROXICET) 5-325 MG tablet Take 1 tablet by mouth every 6 (six) hours as needed. Patient not taking: Reported on 09/02/2016 07/29/16   Minna Antis, MD  promethazine (PHENERGAN) 25 MG tablet Take 1 tablet (25 mg total) by mouth every 6 (six) hours as needed for nausea or vomiting. Patient not taking: Reported on 09/02/2016 08/02/16   Emily Filbert, MD    Allergies: Allergies  Allergen Reactions  . Aspirin Itching  . Buprenorphine Hcl Itching  . Codeine Other (See Comments)    Makes her feel like something is crawling under her skin. Can take, sometimes makes her itch  . Compazine Itching    "makes my skin crawl"  . Ioxaglate Itching  . Ivp Dye [Iodinated Diagnostic Agents] Itching    Makes her itch really bad. Had to get 2-3 shots of Benadryl when she was in the hospital.  . Metrizamide Itching  . Naproxen Itching  . Norco [Hydrocodone-Acetaminophen] Itching  . Penicillins Itching and Other  (See Comments)    Makes her feel really uncomfortable. No other information available.  . Prochlorperazine Maleate Other (See Comments)    Compazine makes her skin crawl - needs 2-3 shots of Benadryl to get relief.  . Reglan [Metoclopramide] Itching and Other (See Comments)    Makes her skin crawl  . Tegretol [Carbamazepine] Itching and Other (See Comments)    Makes her feel like something is crawling under her skin.  . Toradol [Ketorolac Tromethamine] Itching  . Tramadol Itching    Social History:  reports that she has been smoking Cigarettes.  She has a 0.37 pack-year smoking history. She has never used smokeless tobacco. She reports that she does not drink alcohol or use drugs.   Family History: Family History  Problem Relation Age of Onset  . Heart failure  Mother   . Diabetes Father   . Cancer Sister     Review of Systems: Review of Systems  Constitutional: Negative for chills and fever.  HENT: Negative for hearing loss.   Eyes: Negative for blurred vision.  Respiratory: Negative for cough and shortness of breath.   Cardiovascular: Negative for chest pain and leg swelling.  Gastrointestinal: Positive for abdominal pain, nausea and vomiting. Negative for constipation and heartburn.  Genitourinary: Negative for dysuria and hematuria.  Musculoskeletal: Negative for myalgias.  Skin: Negative for rash.  Neurological: Negative for dizziness.  Psychiatric/Behavioral: Negative for depression.    Physical Exam BP (!) 125/94 (BP Location: Right Arm)   Pulse 78   Temp 97.8 F (36.6 C) (Oral)   Resp 18   Ht 5\' 2"  (1.575 m)   Wt 87.6 kg (193 lb 3.2 oz)   SpO2 98%   BMI 35.34 kg/m  CONSTITUTIONAL: No acute distress HEENT:  Normocephalic, atraumatic, extraocular motion intact. NECK: Trachea is midline, and there is no jugular venous distension.  RESPIRATORY:  Lungs are clear, and breath sounds are equal bilaterally. Normal respiratory effort without pathologic use of  accessory muscles. CARDIOVASCULAR: Heart is regular without murmurs, gallops, or rubs. GI: The abdomen is soft, mildly distented, mild tenderness to palpation over the mid abdomen. There were no palpable masses. Patient has low abdominal scar consistent with history of hysterectomy. MUSCULOSKELETAL:  Normal muscle strength and tone in all four extremities.  No peripheral edema or cyanosis. SKIN: Skin turgor is normal. There are no pathologic skin lesions.  NEUROLOGIC:  Motor and sensation is grossly normal.  Cranial nerves are grossly intact. PSYCH:  Alert and oriented to person, place and time. Affect is normal.  Laboratory Analysis: Results for orders placed or performed during the hospital encounter of 09/01/16 (from the past 24 hour(s))  Lipase, blood     Status: None   Collection Time: 09/01/16  8:00 PM  Result Value Ref Range   Lipase 17 11 - 51 U/L  Comprehensive metabolic panel     Status: Abnormal   Collection Time: 09/01/16  8:00 PM  Result Value Ref Range   Sodium 139 135 - 145 mmol/L   Potassium 4.0 3.5 - 5.1 mmol/L   Chloride 103 101 - 111 mmol/L   CO2 25 22 - 32 mmol/L   Glucose, Bld 159 (H) 65 - 99 mg/dL   BUN 12 6 - 20 mg/dL   Creatinine, Ser 1.610.62 0.44 - 1.00 mg/dL   Calcium 9.8 8.9 - 09.610.3 mg/dL   Total Protein 8.2 (H) 6.5 - 8.1 g/dL   Albumin 4.6 3.5 - 5.0 g/dL   AST 23 15 - 41 U/L   ALT 18 14 - 54 U/L   Alkaline Phosphatase 118 38 - 126 U/L   Total Bilirubin 0.9 0.3 - 1.2 mg/dL   GFR calc non Af Amer >60 >60 mL/min   GFR calc Af Amer >60 >60 mL/min   Anion gap 11 5 - 15  CBC     Status: Abnormal   Collection Time: 09/01/16  8:00 PM  Result Value Ref Range   WBC 13.8 (H) 3.6 - 11.0 K/uL   RBC 5.22 (H) 3.80 - 5.20 MIL/uL   Hemoglobin 16.6 (H) 12.0 - 16.0 g/dL   HCT 04.549.5 (H) 40.935.0 - 81.147.0 %   MCV 94.8 80.0 - 100.0 fL   MCH 31.8 26.0 - 34.0 pg   MCHC 33.6 32.0 - 36.0 g/dL   RDW 91.414.1 78.211.5 -  14.5 %   Platelets 193 150 - 440 K/uL    Imaging: Ct Abdomen Pelvis  Wo Contrast  Result Date: 09/01/2016 CLINICAL DATA:  Nausea, vomiting and abdominal pain since this morning. History of diverticulitis and irritable bowel syndrome. History of hysterectomy. EXAM: CT ABDOMEN AND PELVIS WITHOUT CONTRAST TECHNIQUE: Multidetector CT imaging of the abdomen and pelvis was performed following the standard protocol without IV contrast. COMPARISON:  08/05/2016 and 12/14/2013 CT FINDINGS: Lower chest: Respiratory motion limits assessment of the sub solid right middle lobe nodular density seen previously. No pneumonic consolidation, effusion or pulmonary edema. Stable 3 mm subpleural nodular density in the left lower lobe. The visualized cardiac chambers are within normal limits. Small amount of retained oral contrast in the distal esophagus may be due to delayed esophageal emptying versus reflux. Hepatobiliary: No space-occupying mass of the liver. There appears to be some mild biliary sludge along the dependent aspect of the gallbladder which is not distended in appearance. Pancreas: Normal Spleen: Normal Adrenals/Urinary Tract: The kidneys are fluoroscopic, mildly atrophic on the right as before. Mild compensatory left renal hypertrophy. Multifocal right renal cortical scarring and parenchymal calcifications. No obstructive uropathy. Urinary bladder is distended without mass or calculus. Stable 11 mm left adrenal nodule. Stomach/Bowel: Normal bowel rotation. Proximal to mid small bowel dilatation likely due to adhesions in the right lower quadrant, one such adhesion seen on series 2, image 45. This may be causing early SBO or partial SBO. Fecalized material noted in the affected small bowel loops in the right lower quadrant and upper pelvis. Large bowel contains stool and gas without dilatation nor obstruction. No acute inflammation. Mild sigmoid diverticulosis. Normal appendix. Vascular/Lymphatic: Aortoiliac vessels are normal in course and caliber with trace calcific atherosclerosis.  No lymphadenopathy by CT size criteria. Reproductive: Hysterectomy. Other: Trace free fluid in the pelvis.  No free air. Musculoskeletal: L5-S1 degenerative disc disease with moderate to severe L5-S1 neural foraminal encroachment. No acute osseous abnormality. IMPRESSION: Dilated proximal to mid small bowel loops consistent with early or partial SBO and likely related to adhesions. Atrophic, scarred right kidney with parenchymal calcifications. No obstructive uropathy. Stable 11 mm left adrenal nodule likely an adenoma. Minimal biliary sludge noted in the gallbladder. Possible reflux of oral contrast into the distal esophagus. Stable 3 mm left lower lobe pulmonary nodule dating back to 2015 consistent with a benign finding. Electronically Signed   By: Tollie Ethavid  Kwon M.D.   On: 09/01/2016 23:41    Assessment and Plan: This is a 50 y.o. female who presents with a 1 day history of abdominal pain, nausea, and vomiting, with CT scan findings consistent with small bowel obstruction.  I have personally reviewed all of the patient's laboratory and imaging studies.  Patient will be admitted to the general surgery service. She will be nothing by mouth with IV fluid hydration.  NG tube will be placed in emergency room. She will have appropriate pain and nausea medications and DVT and GI prophylaxis. Plavix will be held for now.  Have discussed with the patient that for now we'll continue with conservative measures for treatment of her small bowel obstruction but that if there is no improvement over the next 2-3 days that she may require surgical intervention for her small bowel obstruction. At this point she is not peritoneal and does not require any urgent surgery. The patient understands this plan and all of her questions have been answered.   Howie IllJose Luis Bridgitte Felicetti, MD [facility]

## 2016-09-02 NOTE — ED Notes (Signed)
Admitting Provider at bedside. 

## 2016-09-02 NOTE — Discharge Summary (Signed)
Physician Discharge Summary  [name]: Stephanie GuerinDianna B Luthi MRN: [id number] DOB/AGE: [birth year] 50 y.o.  Admit date: 09/01/2016 Discharge date: 09/02/2016   Discharge Diagnoses:  Active Problems:   Small bowel obstruction   Proceduresnone  Hospital Course: This patient was admitted the hospital with signs of a bowel obstruction. She promptly started passing gas and having bowel movements. A clear liquid diet was instituted around the clamp NG tube and she tolerated that well therefore the NG tube was removed. She continues to tolerate her a clear liquid diet and wants to be discharged at this time. Well her abdominal exam shows no sign of obstruction. Therefore she will be discharged today with instructions to stay on a clear liquid diet for 24 hours and then gradually advance her diet and to return the emergency room should nausea vomiting or pain returned.  Consults: none  Disposition: 01-Home or Self Care   Allergies as of 09/02/2016      Reactions   Aspirin Itching   Buprenorphine Hcl Itching   Codeine Other (See Comments)   Makes her feel like something is crawling under her skin. Can take, sometimes makes her itch   Compazine Itching   "makes my skin crawl"   Ioxaglate Itching   Ivp Dye [iodinated Diagnostic Agents] Itching   Makes her itch really bad. Had to get 2-3 shots of Benadryl when she was in the hospital.   Metrizamide Itching   Naproxen Itching   Norco [hydrocodone-acetaminophen] Itching   Penicillins Itching, Other (See Comments)   Makes her feel really uncomfortable. No other information available.   Prochlorperazine Maleate Other (See Comments)   Compazine makes her skin crawl - needs 2-3 shots of Benadryl to get relief.   Reglan [metoclopramide] Itching, Other (See Comments)   Makes her skin crawl   Tegretol [carbamazepine] Itching, Other (See Comments)   Makes her feel like something is crawling under her skin.   Toradol [ketorolac Tromethamine]  Itching   Tramadol Itching      Medication List    STOP taking these medications   cephALEXin 500 MG capsule Commonly known as:  KEFLEX   nitrofurantoin (macrocrystal-monohydrate) 100 MG capsule Commonly known as:  MACROBID   OLANZapine zydis 5 MG disintegrating tablet Commonly known as:  ZYPREXA   oxyCODONE-acetaminophen 5-325 MG tablet Commonly known as:  ROXICET   promethazine 25 MG tablet Commonly known as:  PHENERGAN     TAKE these medications   alprazolam 2 MG tablet Commonly known as:  XANAX Take 2 mg by mouth daily as needed for anxiety.   amLODipine 10 MG tablet Commonly known as:  NORVASC Take 10 mg by mouth daily.   atorvastatin 40 MG tablet Commonly known as:  LIPITOR Take 40 mg by mouth every evening.   busPIRone 10 MG tablet Commonly known as:  BUSPAR Take 10 mg by mouth 3 (three) times daily.   butalbital-acetaminophen-caffeine 50-325-40 MG tablet Commonly known as:  FIORICET, ESGIC Take 1 tablet by mouth 2 (two) times daily as needed for headache.   cloNIDine 0.1 MG tablet Commonly known as:  CATAPRES Take 0.1 mg by mouth daily.   clopidogrel 75 MG tablet Commonly known as:  PLAVIX Take 1 tablet (75 mg total) by mouth daily.   dicyclomine 20 MG tablet Commonly known as:  BENTYL Take 20 mg by mouth 2 (two) times daily.   diphenoxylate-atropine 2.5-0.025 MG tablet Commonly known as:  LOMOTIL Take 1 tablet by mouth 4 (four) times daily as needed for  diarrhea or loose stools.   DULoxetine 60 MG capsule Commonly known as:  CYMBALTA Take 60 mg by mouth daily.   glipiZIDE 5 MG tablet Commonly known as:  GLUCOTROL Take 1 tablet (5 mg total) by mouth 2 (two) times daily before a meal.   hyoscyamine 0.125 MG SL tablet Commonly known as:  LEVSIN SL Place 0.125 mg under the tongue every 4 (four) hours as needed.   INVOKANA 300 MG Tabs tablet Generic drug:  canagliflozin Take 300 mg by mouth daily before breakfast.   labetalol 100 MG  tablet Commonly known as:  NORMODYNE Take 1 tablet (100 mg total) by mouth 3 (three) times daily.   levETIRAcetam 500 MG tablet Commonly known as:  KEPPRA Take 1 tablet (500 mg total) by mouth 2 (two) times daily.   losartan 50 MG tablet Commonly known as:  COZAAR Take 50 mg by mouth 2 (two) times daily.   metoprolol 50 MG tablet Commonly known as:  LOPRESSOR Take 50 mg by mouth 2 (two) times daily.   omeprazole 20 MG capsule Commonly known as:  PRILOSEC Take 40 mg by mouth daily.   ondansetron 4 MG disintegrating tablet Commonly known as:  ZOFRAN ODT Take 1 tablet (4 mg total) by mouth every 8 (eight) hours as needed for nausea or vomiting.   topiramate 25 MG tablet Commonly known as:  TOPAMAX Take 25 mg by mouth daily.   zolpidem 10 MG tablet Commonly known as:  AMBIEN Take 10 mg by mouth at bedtime as needed for sleep.      Follow-up Information    HOLLAND, CHELSA, NP Follow up in 2 week(s).   Specialty:  Nurse Practitioner Contact information: [address] [place] [address] [phone]           Lattie Hawichard E Chayse Gracey, MD, FACS

## 2016-09-02 NOTE — Discharge Instructions (Signed)
Resume all home medications Follow-up with primary care in 2 weeks No new medications Return to the emergency room should bowel obstruction symptoms return

## 2016-09-02 NOTE — Progress Notes (Signed)
Patient admitted early this morning with bowel obstruction. An NG tube was placed and she promptly started passing gas. She is experiencing no abdominal pain at this point. She is asking for coffee.  Vital signs are reviewed Abdomen is soft and nondistended nontender Calves are nontender  We'll clamp NG tube and start sips of clears. If she tolerates this and improves will discontinue NG tube and possibly discharge later today.

## 2016-09-02 NOTE — Progress Notes (Signed)
Patient discharged to home as ordered. Patient denies pain at this time and tolerated clear liquid diet.Patient instructed to make follow up appointment with Dr. Marcelle OverlieHolland in two weeks. Patient is alert and oriented, ambulates without assistance. No acute distress noted.

## 2016-09-02 NOTE — Progress Notes (Signed)
Patient has home medication for Fioricet and states that she takes Fioricet when her headache gets bad at home. Received order from Dr. Excell Seltzerooper to start headache medication that she takes at home. Medication initiated as ordered. NGT discontinued as ordered. Patient stats that her headache is not as bad now. Will await pharmacy to verify medication and ask patient if she still wants something for her headache.

## 2016-09-07 ENCOUNTER — Emergency Department
Admission: EM | Admit: 2016-09-07 | Discharge: 2016-09-07 | Disposition: A | Discharge status: Home / Self Care | Payer: 59 | Attending: Emergency Medicine | Admitting: Emergency Medicine

## 2016-09-07 ENCOUNTER — Emergency Department: Payer: 59

## 2016-09-07 DIAGNOSIS — R197 Diarrhea, unspecified: Secondary | ICD-10-CM | POA: Insufficient documentation

## 2016-09-07 DIAGNOSIS — R1013 Epigastric pain: Secondary | ICD-10-CM | POA: Insufficient documentation

## 2016-09-07 DIAGNOSIS — I10 Essential (primary) hypertension: Secondary | ICD-10-CM | POA: Diagnosis not present

## 2016-09-07 DIAGNOSIS — Z7984 Long term (current) use of oral hypoglycemic drugs: Secondary | ICD-10-CM | POA: Diagnosis not present

## 2016-09-07 DIAGNOSIS — E119 Type 2 diabetes mellitus without complications: Secondary | ICD-10-CM | POA: Insufficient documentation

## 2016-09-07 DIAGNOSIS — Z79899 Other long term (current) drug therapy: Secondary | ICD-10-CM | POA: Diagnosis not present

## 2016-09-07 DIAGNOSIS — F17210 Nicotine dependence, cigarettes, uncomplicated: Secondary | ICD-10-CM | POA: Insufficient documentation

## 2016-09-07 DIAGNOSIS — R109 Unspecified abdominal pain: Secondary | ICD-10-CM

## 2016-09-07 LAB — COMPREHENSIVE METABOLIC PANEL
ALT: 27 U/L (ref 14–54)
AST: 30 U/L (ref 15–41)
Albumin: 4.3 g/dL (ref 3.5–5.0)
Alkaline Phosphatase: 92 U/L (ref 38–126)
Anion gap: 8 (ref 5–15)
BUN: 10 mg/dL (ref 6–20)
CHLORIDE: 108 mmol/L (ref 101–111)
CO2: 24 mmol/L (ref 22–32)
CREATININE: 0.53 mg/dL (ref 0.44–1.00)
Calcium: 9.9 mg/dL (ref 8.9–10.3)
GFR calc Af Amer: >60 mL/min (ref >60)
Glucose, Bld: 113 mg/dL — ABNORMAL HIGH (ref 65–99)
POTASSIUM: 4.1 mmol/L (ref 3.5–5.1)
SODIUM: 140 mmol/L (ref 135–145)
Total Bilirubin: 0.8 mg/dL (ref 0.3–1.2)
Total Protein: 8 g/dL (ref 6.5–8.1)

## 2016-09-07 LAB — URINALYSIS, COMPLETE (UACMP) WITH MICROSCOPIC
BILIRUBIN URINE: NEGATIVE
Glucose, UA: >=500 mg/dL — AB
Hgb urine dipstick: NEGATIVE
KETONES UR: NEGATIVE mg/dL
Nitrite: NEGATIVE
Protein, ur: NEGATIVE mg/dL
RBC / HPF: NONE SEEN RBC/hpf (ref 0–5)
Specific Gravity, Urine: 1.002 — ABNORMAL LOW (ref 1.005–1.030)
pH: 7 (ref 5.0–8.0)

## 2016-09-07 LAB — CBC
HEMATOCRIT: 45.6 % (ref 35.0–47.0)
Hemoglobin: 15.7 g/dL (ref 12.0–16.0)
MCH: 32.4 pg (ref 26.0–34.0)
MCHC: 34.5 g/dL (ref 32.0–36.0)
MCV: 93.9 fL (ref 80.0–100.0)
PLATELETS: 197 10*3/uL (ref 150–440)
RBC: 4.86 MIL/uL (ref 3.80–5.20)
RDW: 14 % (ref 11.5–14.5)
WBC: 9 10*3/uL (ref 3.6–11.0)

## 2016-09-07 LAB — TROPONIN I

## 2016-09-07 LAB — LIPASE, BLOOD: LIPASE: 21 U/L (ref 11–51)

## 2016-09-07 MED ORDER — MORPHINE SULFATE (PF) 4 MG/ML IV SOLN
4.0000 mg | Freq: Once | INTRAVENOUS | Status: AC
  Administered 2016-09-07: 4 mg via INTRAVENOUS
  Filled 2016-09-07: qty 1

## 2016-09-07 MED ORDER — SODIUM CHLORIDE 0.9 % IV BOLUS (SEPSIS)
1000.0000 mL | Freq: Once | INTRAVENOUS | Status: AC
  Administered 2016-09-07: 1000 mL via INTRAVENOUS

## 2016-09-07 NOTE — ED Provider Notes (Addendum)
[facility] Emergency Department Provider Note  ____________________________________________   I have reviewed the triage vital signs and the nursing notes.   HISTORY  Chief Complaint Abdominal Pain    HPI Stephanie [name] is a 50 y.o. female with multiple chronic pain issues including chronic abdominal pain, who has had 4 CT scans of her abdomen pelvis in the last 13 months, all of them negative except for one in December of this year, on the 25th, which showed a questionable SBO. She was admitted had an NG tube however did not have any ongoing symptoms of SBO was very soon after discharge. Since going home she states she felt better for a day or 2 than the pain came back in the epigastric region which is a recurrent pain she has had off and on for years. She is also having nonbloody not melanotic stool which is loose. She does have history of recurrent loose stool. She has had no vomiting or fever. Patient has had a very concerning 18 different CT scans of the abdomen and pelvis since 2009.     Past Medical History:  Diagnosis Date  . Diabetes mellitus without complication (HCC)   . Diverticulitis   . Hepatitis C   . Hypertension   . IBS (irritable bowel syndrome)   . Kidney stones   . Sciatica   . Stroke [facility])     Patient Active Problem List   Diagnosis Date Noted  . Small bowel obstruction 09/02/2016  . Pain and swelling of left upper extremity 07/22/2016  . Chronic thrombosis of left basilic vein 07/22/2016  . ICH (intracerebral hemorrhage) (HCC)   . Essential hypertension, malignant 04/19/2016  . Cytotoxic brain edema (HCC) 04/19/2016  . IVH (intraventricular hemorrhage) (HCC) 04/18/2016  . Hepatitis C     Past Surgical History:  Procedure Laterality Date  . ABDOMINAL HYSTERECTOMY    . KIDNEY STONE SURGERY Right 02/12/12  . KIDNEY STONE SURGERY Left 07/15/2012  . LIGATION OF ARTERIOVENOUS  FISTULA Left 06/21/2016   Procedure: LIGATION OF  ARTERIOVENOUS  FISTULA ( LIGATION BASILIC VEIN );  Surgeon: Renford Dills, MD;  Location: [facility] ORS;  Service: Vascular;  Laterality: Left;  . LITHOTRIPSY    . OOPHORECTOMY    . SINUS EXPLORATION      Prior to Admission medications   Medication Sig Start Date End Date Taking? Authorizing Provider  alprazolam Prudy Feeler) 2 MG tablet Take 2 mg by mouth 2 (two) times daily.    Yes Historical Provider, MD  amLODipine (NORVASC) 10 MG tablet Take 10 mg by mouth daily.   Yes Historical Provider, MD  atorvastatin (LIPITOR) 40 MG tablet Take 40 mg by mouth every evening.   Yes Historical Provider, MD  cholecalciferol (VITAMIN D) 1000 units tablet Take 2,000 Units by mouth daily.   Yes Historical Provider, MD  cloNIDine (CATAPRES) 0.1 MG tablet Take 0.1 mg by mouth daily.    Yes Historical Provider, MD  clopidogrel (PLAVIX) 75 MG tablet Take 1 tablet (75 mg total) by mouth daily. 06/27/16  Yes Marvel Plan, MD  DULoxetine (CYMBALTA) 60 MG capsule Take 60 mg by mouth daily.   Yes Historical Provider, MD  glipiZIDE (GLUCOTROL) 5 MG tablet Take 1 tablet (5 mg total) by mouth 2 (two) times daily before a meal. 04/30/16  Yes Lora Paula, MD  INVOKANA 300 MG TABS tablet Take 300 mg by mouth daily before breakfast.    Yes Historical Provider, MD  levETIRAcetam (KEPPRA) 500 MG  tablet Take 1 tablet (500 mg total) by mouth 2 (two) times daily. 04/30/16  Yes Lora Paula, MD  losartan (COZAAR) 50 MG tablet Take 50 mg by mouth 2 (two) times daily.   Yes Historical Provider, MD  metoprolol (LOPRESSOR) 50 MG tablet Take 50 mg by mouth 2 (two) times daily.    Yes Historical Provider, MD  omeprazole (PRILOSEC) 40 MG capsule Take 40 mg by mouth daily.   Yes Historical Provider, MD  topiramate (TOPAMAX) 25 MG tablet Take 25 mg by mouth daily.   Yes Historical Provider, MD  zolpidem (AMBIEN) 10 MG tablet Take 10 mg by mouth at bedtime as needed for sleep.    Yes Historical Provider, MD   butalbital-acetaminophen-caffeine (FIORICET, ESGIC) 50-325-40 MG tablet Take 1 tablet by mouth 2 (two) times daily as needed for headache.    Historical Provider, MD  dicyclomine (BENTYL) 20 MG tablet Take 20 mg by mouth 2 (two) times daily.  07/26/15 07/25/16  Historical Provider, MD  diphenoxylate-atropine (LOMOTIL) 2.5-0.025 MG tablet Take 1 tablet by mouth 4 (four) times daily as needed for diarrhea or loose stools.    Historical Provider, MD  hyoscyamine (LEVSIN SL) 0.125 MG SL tablet Place 0.125 mg under the tongue every 4 (four) hours as needed.    Historical Provider, MD  labetalol (NORMODYNE) 100 MG tablet Take 1 tablet (100 mg total) by mouth 3 (three) times daily. Patient not taking: Reported on 09/07/2016 04/30/16   Lora Paula, MD  ondansetron (ZOFRAN ODT) 4 MG disintegrating tablet Take 1 tablet (4 mg total) by mouth every 8 (eight) hours as needed for nausea or vomiting. Patient not taking: Reported on 09/07/2016 07/29/16   Minna Antis, MD    Allergies Aspirin; Buprenorphine hcl; Codeine; Compazine; Ioxaglate; Ivp dye [iodinated diagnostic agents]; Metrizamide; Naproxen; Norco [hydrocodone-acetaminophen]; Penicillins; Prochlorperazine maleate; Reglan [metoclopramide]; Tegretol [carbamazepine]; Toradol [ketorolac tromethamine]; and Tramadol  Family History  Problem Relation Age of Onset  . Heart failure Mother   . Diabetes Father   . Cancer Sister     Social History Social History  Substance Use Topics  . Smoking status: Current Every Day Smoker    Packs/day: 0.25    Years: 1.50    Types: Cigarettes  . Smokeless tobacco: Never Used  . Alcohol use No    Review of Systems Constitutional: No fever/chills Eyes: No visual changes. ENT: No sore throat. No stiff neck no neck pain Cardiovascular: Denies chest pain. Respiratory: Denies shortness of breath. Gastrointestinal:   no vomiting.  Positive diarrhea.  No constipation. Genitourinary: Negative for  dysuria. Musculoskeletal: Negative lower extremity swelling Skin: Negative for rash. Neurological: Negative for severe headaches, focal weakness or numbness. 10-point ROS otherwise negative.  ____________________________________________   PHYSICAL EXAM:  VITAL SIGNS: ED Triage Vitals [09/07/16 0747]  Enc Vitals Group     BP (!) 150/96     Pulse Rate 73     Resp 20     Temp 98 F (36.7 C)     Temp Source Oral     SpO2 99 %     Weight 196 lb (88.9 kg)     Height 5\' 2"  (1.575 m)     Head Circumference      Peak Flow      Pain Score 8     Pain Loc      Pain Edu?      Excl. in GC?     Constitutional: Alert and oriented. Well appearing and in no  acute distress. Eyes: Conjunctivae are normal. PERRL. EOMI. Head: Atraumatic. Nose: No congestion/rhinnorhea. Mouth/Throat: Mucous membranes are moist.  Oropharynx non-erythematous. Neck: No stridor.   Nontender with no meningismus Cardiovascular: Normal rate, regular rhythm. Grossly normal heart sounds.  Good peripheral circulation. Respiratory: Normal respiratory effort.  No retractions. Lungs CTAB. Abdominal: Soft and epigastric discomfort which reproduces her pain and no surgical pathology noted. No distention. No guarding no rebound Back:  There is no focal tenderness or step off.  there is no midline tenderness there are no lesions noted. there is no CVA tenderness Musculoskeletal: No lower extremity tenderness, no upper extremity tenderness. No joint effusions, no DVT signs strong distal pulses no edema Neurologic:  Normal speech and language. No gross focal neurologic deficits are appreciated.  Skin:  Skin is warm, dry and intact. No rash noted. Psychiatric: Mood and affect are normal. Speech and behavior are normal.  ____________________________________________   LABS (all labs ordered are listed, but only abnormal results are displayed)  Labs Reviewed  COMPREHENSIVE METABOLIC PANEL - Abnormal; Notable for the following:        Result Value   Glucose, Bld 113 (*)    All other components within normal limits  URINALYSIS, COMPLETE (UACMP) WITH MICROSCOPIC - Abnormal; Notable for the following:    Color, Urine STRAW (*)    APPearance CLEAR (*)    Specific Gravity, Urine 1.002 (*)    Glucose, UA >=500 (*)    Leukocytes, UA TRACE (*)    Bacteria, UA RARE (*)    Squamous Epithelial / LPF 0-5 (*)    All other components within normal limits  LIPASE, BLOOD  CBC  TROPONIN I   ____________________________________________  EKG  I personally interpreted any EKGs ordered by me or triage Normal sinus rhythm at 63 bpm no acute ST elevation or acute ST depression normal axis unremarkable EKG ____________________________________________  RADIOLOGY  I reviewed any imaging ordered by me or triage that were performed during my shift and, if possible, patient and/or family made aware of any abnormal findings. ____________________________________________   PROCEDURES  Procedure(s) performed: None  Procedures  Critical Care performed: None  ____________________________________________   INITIAL IMPRESSION / ASSESSMENT AND PLAN / ED COURSE  Pertinent labs & imaging results that were available during my care of the patient were reviewed by me and considered in my medical decision making (see chart for details).  Patient with chronic recurrent abdominal pain and unfortunately a very large radiation burden not only from multiple CT scans of her head from prior head bleed but also as noted a very significant and concerning number of CT scans of her abdomen and pelvis over the last several years for various abdominal and renal complaints presents today complaining of ongoing abdominal pain and diarrhea. Vital signs are reassuring. Blood work is reassuring, she is not vomiting there is nothing at this time to strongly suggest SBO in terms of her symptoms. However, given prior CT read and ongoing abdominal discomfort  we will start with  a abdominal x-ray and reassess. Patient complains of 15 different medication intolerances many of which, unfortunately, are medications used to treat abdominal pain.  She is requesting morphine or Dilaudid for pain.  ----------------------------------------- 12:48 PM on 09/07/2016 -----------------------------------------  Patient states "nothing" that I can give her at home will help her pain. She is angry that she has not received pain medication yet so far. I have explained that I'm very reluctant to give her pain medication because of her significant  allergy profile and the of her pain. She states that usually she gets one dose of pain medications in the emergency room she is pain-free for several days. I have therefore acquiesce to giving her 1 dose of IV pain medication. Patient remains in no acute distress blood work and vital signs are reassuring. No evidence of any acute abdominal pathology. X-ray shows no evidence of SBO she is not actually vomiting. Patient cannot give us a stool sample she states. Low suspicion for C. difficilKoreae but again she cannot give me a sample. She will need to follow-up with her primary care doctor. I do not think a CT scan is warranted. Patient has noted has had innumerable CT scans with minimal response and I think that the risk of cancer outweighs the benefit. Patient also agrees with this. She would like to go home at this time. She is requesting discharge. We will give her several doses of pain medication as described I will explain to her that this is not a normal thing that we can usually do in the emergency department for her, and I have strongly suggested that she follow-up both with her primary care doctor and the GI doctor who is been helping her with this in the past to see if she can, to some resolution that does not involve IV pain medication. In addition, patient made aware that time she feels worse she can come back and follow-up  instructions are exposed explain. Do not think this represents referred chest discomfort. PAt this time, there does not appear to be clinical evidence to support the diagnosis of pulmonary embolus, dissection, myocarditis, endocarditis, pericarditis, pericardial tamponade, acute coronary syndrome, pneumothorax, pneumonia, or any other acute intrathoracic pathology that will require admission or acute intervention. Nor is there evidence of any significant intra-abdominal pathology causing this discomfort. She has no right upper quadrant pain and at this time her abdomen is completely benign. Nothing to suggest gallbladder.     Clinical Course    ____________________________________________   FINAL CLINICAL IMPRESSION(S) / ED DIAGNOSES  Final diagnoses:  None      This chart was dictated using voice recognition software.  Despite best efforts to proofread,  errors can occur which can change meaning.      Jeanmarie PlantJames A Ahmad Vanwey, MD 09/07/16 1147    Jeanmarie PlantJames A Dalisha Shively, MD 09/07/16 1250    Jeanmarie PlantJames A Chevelle Durr, MD 09/07/16 [phone]

## 2016-09-07 NOTE — ED Notes (Signed)
Report given to Stephen

## 2016-09-07 NOTE — ED Notes (Signed)
Patient transported to X-ray 

## 2016-09-07 NOTE — ED Triage Notes (Signed)
Pt was recently discharged from hospital for small bowel obstruction, pt states that she has cont to have diarrhea and abd pain as well as nausea, pt's dr encouraged her to return to the ER for possibility of continued obstruction

## 2016-09-08 IMAGING — CR DG ABDOMEN ACUTE W/ 1V CHEST
3 series · 3 of 3 positions shown · non-contrast
Comparison: Chest and abdominal radiographs performed 01/31/2014,
and abdominal radiographs performed 05/08/2014

CLINICAL DATA: Acute onset of upper abdominal pain and vomiting for
24 hours. Weakness and dizziness. Initial encounter.

EXAM:
ACUTE ABDOMEN SERIES (ABDOMEN 2 VIEW & CHEST 1 VIEW)

[w chest pa]
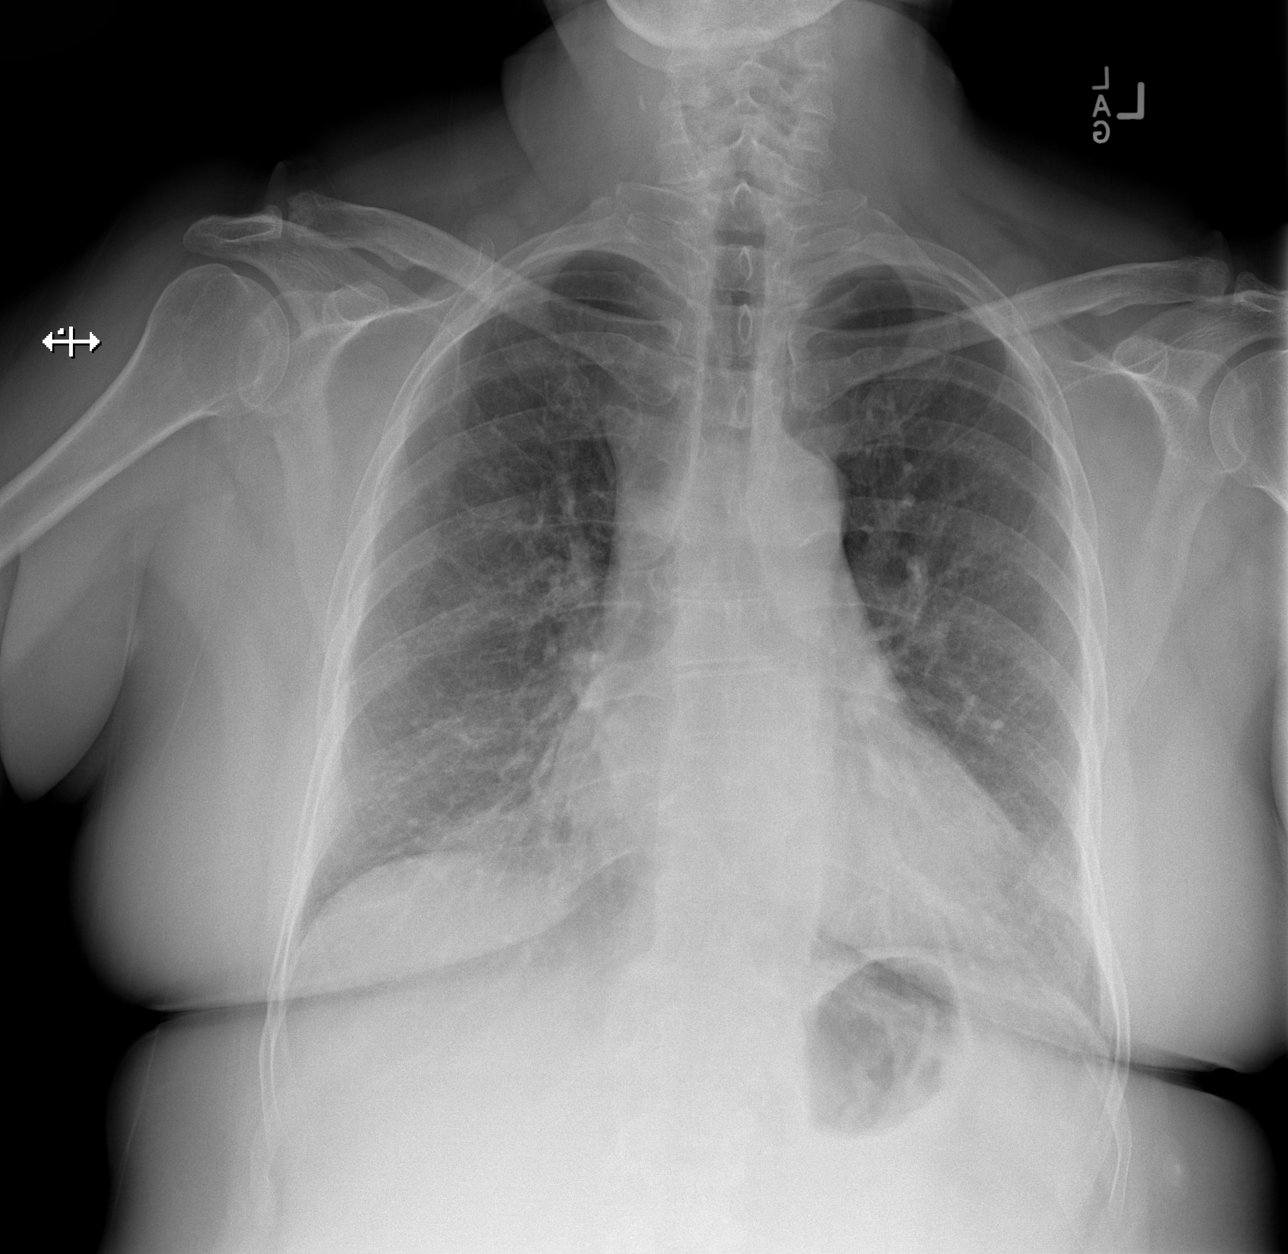

[w abdomen upright]
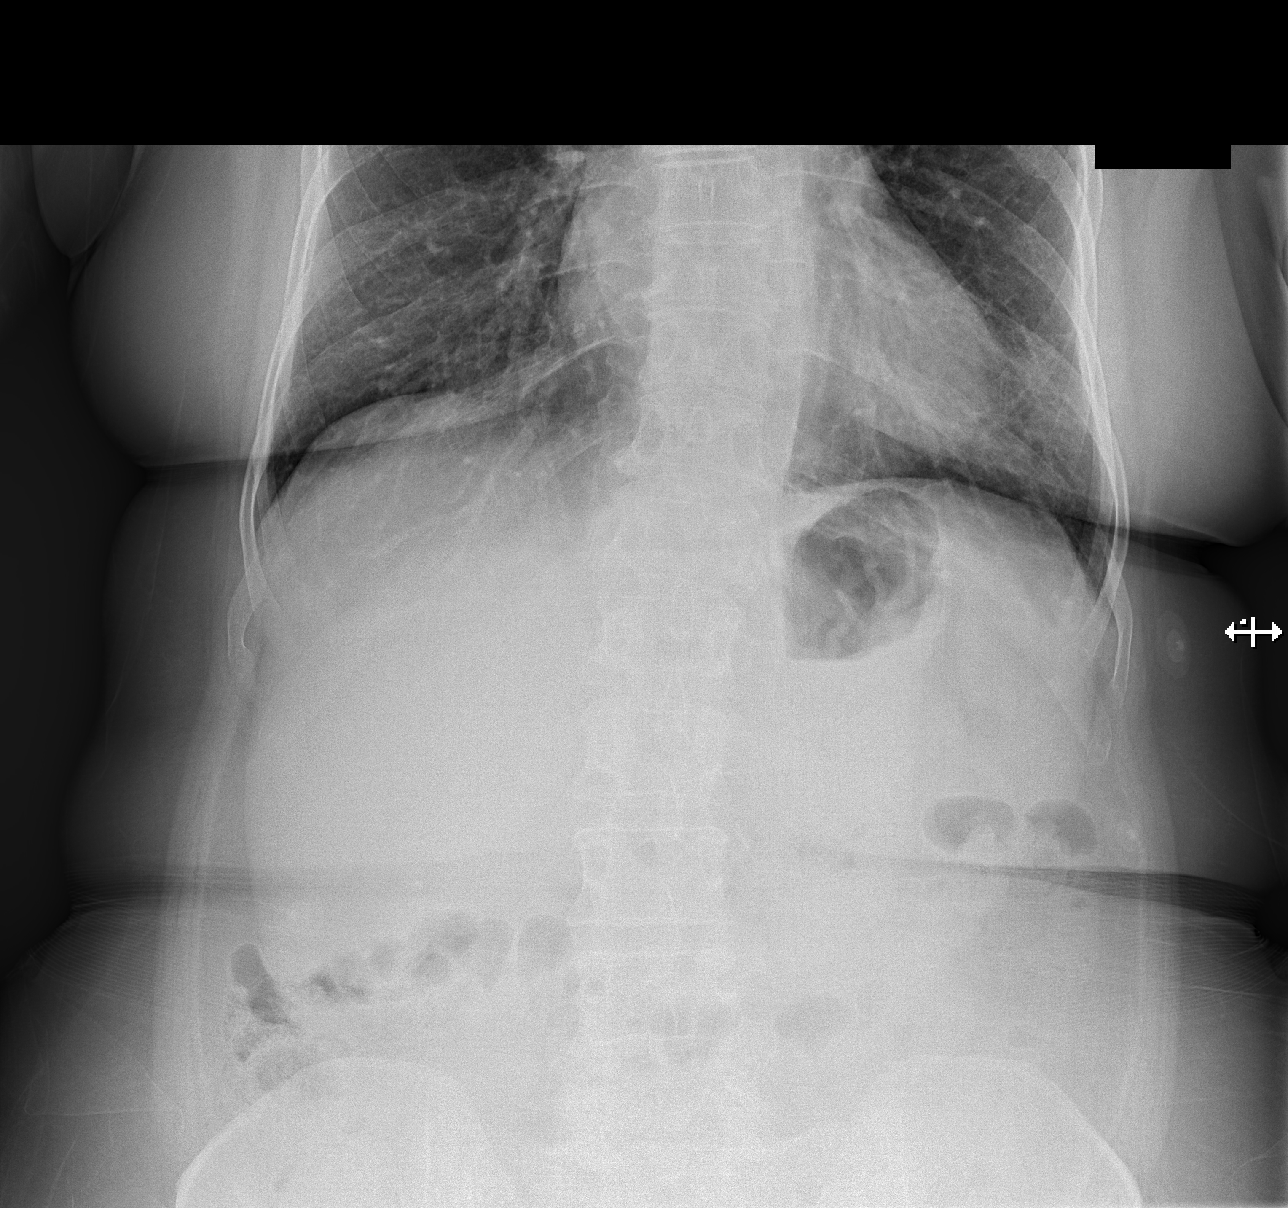

[t abdomen supine]
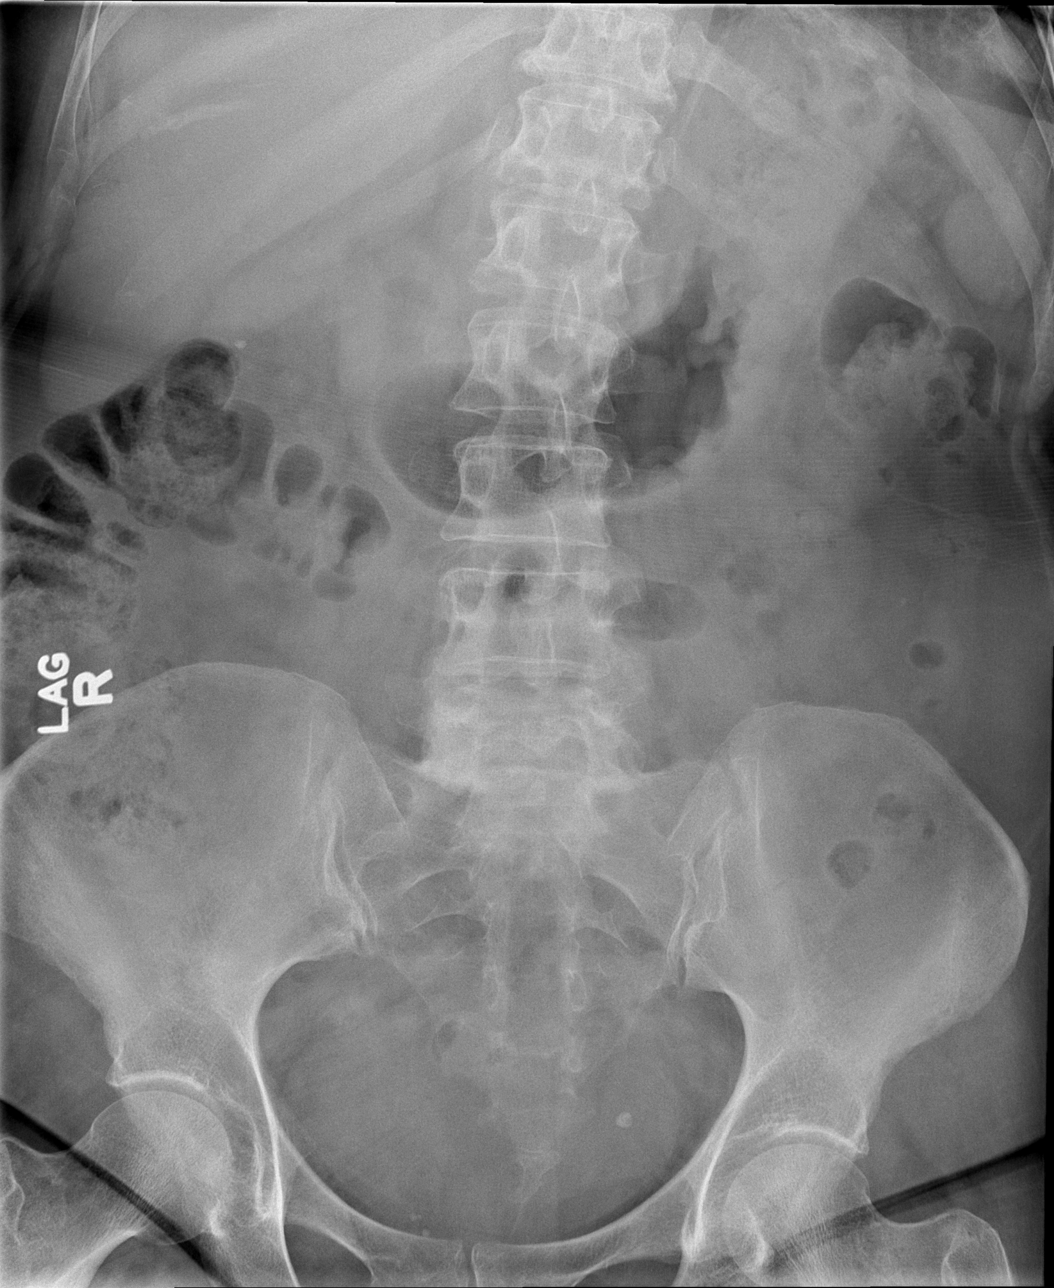

[3 of 3 positions shown; findings below may reference images not displayed]

FINDINGS: The lungs are well-aerated. Minimal bibasilar atelectasis is noted.
Mild vascular congestion is seen. There is no evidence of pleural
effusion or pneumothorax. The cardiomediastinal silhouette is within
normal limits.

The visualized bowel gas pattern is unremarkable. Scattered stool
and air are seen within the colon; there is no evidence of small
bowel dilatation to suggest obstruction. No free intra-abdominal air
is identified on the provided upright view.

No acute osseous abnormalities are seen; the sacroiliac joints are
unremarkable in appearance.
IMPRESSION: 1. Unremarkable bowel gas pattern; no free intra-abdominal air seen.
2. Minimal bibasilar atelectasis noted; mild vascular congestion
seen.

## 2016-09-18 ENCOUNTER — Other Ambulatory Visit: Payer: Self-pay | Admitting: *Deleted

## 2016-09-25 ENCOUNTER — Ambulatory Visit: Payer: 59 | Admitting: Gastroenterology

## 2016-09-27 ENCOUNTER — Telehealth: Payer: Self-pay | Admitting: Gastroenterology

## 2016-09-27 NOTE — Telephone Encounter (Signed)
Left voice message with patient to call and reschedule appointment with GI for IBS

## 2016-10-01 ENCOUNTER — Ambulatory Visit: Payer: PRIVATE HEALTH INSURANCE | Admitting: Neurology

## 2016-10-02 ENCOUNTER — Encounter: Payer: Self-pay | Admitting: Neurology

## 2016-10-07 ENCOUNTER — Ambulatory Visit: Payer: 59 | Admitting: Gastroenterology

## 2016-10-16 ENCOUNTER — Telehealth: Payer: Self-pay | Admitting: Nurse Practitioner

## 2016-10-16 NOTE — Telephone Encounter (Signed)
Pt called said she missed the appt with Dr Roda ShuttersXu bc she is having blackout spells again. I scheduled her with Eber JonesCarolyn, Dr Roda ShuttersXu is booked into May.

## 2016-10-17 NOTE — Telephone Encounter (Signed)
Please make sure Stephanie [name] is here the day you schedule

## 2016-10-18 ENCOUNTER — Emergency Department: Payer: 59

## 2016-10-18 ENCOUNTER — Encounter: Payer: Self-pay | Admitting: Emergency Medicine

## 2016-10-18 ENCOUNTER — Emergency Department
Admission: EM | Admit: 2016-10-18 | Discharge: 2016-10-18 | Disposition: A | Discharge status: Home / Self Care | Payer: 59 | Attending: Emergency Medicine | Admitting: Emergency Medicine

## 2016-10-18 DIAGNOSIS — I10 Essential (primary) hypertension: Secondary | ICD-10-CM | POA: Insufficient documentation

## 2016-10-18 DIAGNOSIS — F17210 Nicotine dependence, cigarettes, uncomplicated: Secondary | ICD-10-CM | POA: Insufficient documentation

## 2016-10-18 DIAGNOSIS — N390 Urinary tract infection, site not specified: Secondary | ICD-10-CM | POA: Insufficient documentation

## 2016-10-18 DIAGNOSIS — E119 Type 2 diabetes mellitus without complications: Secondary | ICD-10-CM | POA: Insufficient documentation

## 2016-10-18 DIAGNOSIS — R51 Headache: Secondary | ICD-10-CM | POA: Insufficient documentation

## 2016-10-18 DIAGNOSIS — Z7984 Long term (current) use of oral hypoglycemic drugs: Secondary | ICD-10-CM | POA: Diagnosis not present

## 2016-10-18 DIAGNOSIS — R1032 Left lower quadrant pain: Secondary | ICD-10-CM | POA: Diagnosis present

## 2016-10-18 DIAGNOSIS — Z79899 Other long term (current) drug therapy: Secondary | ICD-10-CM | POA: Insufficient documentation

## 2016-10-18 DIAGNOSIS — R519 Headache, unspecified: Secondary | ICD-10-CM

## 2016-10-18 LAB — BLOOD GAS, VENOUS
ACID-BASE EXCESS: 6.4 mmol/L — AB (ref 0.0–2.0)
BICARBONATE: 33.5 mmol/L — AB (ref 20.0–28.0)
FIO2: 0.21
PCO2 VEN: 58 mmHg (ref 44.0–60.0)
PH VEN: 7.37 (ref 7.250–7.430)
Patient temperature: 37

## 2016-10-18 LAB — BASIC METABOLIC PANEL
ANION GAP: 8 (ref 5–15)
BUN: 9 mg/dL (ref 6–20)
CALCIUM: 9.7 mg/dL (ref 8.9–10.3)
CO2: 28 mmol/L (ref 22–32)
Chloride: 105 mmol/L (ref 101–111)
Creatinine, Ser: 0.54 mg/dL (ref 0.44–1.00)
GLUCOSE: 82 mg/dL (ref 65–99)
Potassium: 4.9 mmol/L (ref 3.5–5.1)
Sodium: 141 mmol/L (ref 135–145)

## 2016-10-18 LAB — CBC
HCT: 48.4 % — ABNORMAL HIGH (ref 35.0–47.0)
HEMOGLOBIN: 16.2 g/dL — AB (ref 12.0–16.0)
MCH: 31.5 pg (ref 26.0–34.0)
MCHC: 33.6 g/dL (ref 32.0–36.0)
MCV: 93.9 fL (ref 80.0–100.0)
PLATELETS: 210 10*3/uL (ref 150–440)
RBC: 5.15 MIL/uL (ref 3.80–5.20)
RDW: 14 % (ref 11.5–14.5)
WBC: 9.5 10*3/uL (ref 3.6–11.0)

## 2016-10-18 LAB — URINALYSIS, COMPLETE (UACMP) WITH MICROSCOPIC
Bilirubin Urine: NEGATIVE
Glucose, UA: >=500 mg/dL — AB
Hgb urine dipstick: NEGATIVE
KETONES UR: NEGATIVE mg/dL
NITRITE: NEGATIVE
PH: 7 (ref 5.0–8.0)
Protein, ur: NEGATIVE mg/dL
SPECIFIC GRAVITY, URINE: 1.003 — AB (ref 1.005–1.030)

## 2016-10-18 LAB — GLUCOSE, CAPILLARY: Glucose-Capillary: 82 mg/dL (ref 65–99)

## 2016-10-18 LAB — TROPONIN I: Troponin I: <0.03 ng/mL (ref <0.03)

## 2016-10-18 MED ORDER — MORPHINE SULFATE (PF) 4 MG/ML IV SOLN
4.0000 mg | Freq: Once | INTRAVENOUS | Status: AC
  Administered 2016-10-18: 4 mg via INTRAVENOUS
  Filled 2016-10-18: qty 1

## 2016-10-18 MED ORDER — MAGNESIUM SULFATE IN D5W 1-5 GM/100ML-% IV SOLN
1.0000 g | Freq: Once | INTRAVENOUS | Status: AC
  Administered 2016-10-18: 1 g via INTRAVENOUS
  Filled 2016-10-18: qty 100

## 2016-10-18 MED ORDER — CEPHALEXIN 500 MG PO CAPS: 500.0000 mg | ORAL_CAPSULE | Freq: Two times a day (BID) | ORAL | 0 refills | Status: DC

## 2016-10-18 MED ORDER — CEFTRIAXONE SODIUM-DEXTROSE 1-3.74 GM-% IV SOLR
1.0000 g | INTRAVENOUS | Status: DC
  Administered 2016-10-18: 1 g via INTRAVENOUS
  Filled 2016-10-18: qty 50

## 2016-10-18 MED ORDER — DEXTROSE 5 % IV SOLN: 1.0000 g | Freq: Once | INTRAVENOUS | Status: DC

## 2016-10-18 MED ORDER — ONDANSETRON HCL 4 MG/2ML IJ SOLN
4.0000 mg | Freq: Once | INTRAMUSCULAR | Status: AC
  Administered 2016-10-18: 4 mg via INTRAVENOUS
  Filled 2016-10-18: qty 2

## 2016-10-18 NOTE — ED Notes (Signed)
Pt stated that she has had vomiting and diarrhea for the last two days.

## 2016-10-18 NOTE — ED Triage Notes (Addendum)
Pt reports having "black out" spells for past 3-4 days. Denies syncope but reports doesn't remember doing things.  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 Reports does things but does not remember doing them.  C/o generalized weakness.  Has not fallen. Also has headache. Not worst headache she has had. Pt feels like symptoms are same as when had head bleed but not quite as bad.  They were not sure why she had bleed. Slightly drowsy. Has sleep apnea but has not had machine.

## 2016-10-18 NOTE — ED Notes (Signed)
Patient states she has diverticulosis and it has been hurting her the last few days on the left side. Diarrhea for last 2 days and has not eaten. Vomited twice yesterday, once today. Patient having episodes where she is doing things and doesn't remember doing them "like black outs". Having headaches like pressure on top.

## 2016-10-18 NOTE — ED Notes (Signed)
Patient ambulated to the toilet without difficulty.  

## 2016-10-18 NOTE — ED Provider Notes (Signed)
[facility] Emergency Department Provider Note ____________________________________________   I have reviewed the triage vital signs and the triage nursing note.  HISTORY  Chief Complaint Weakness and possible syncope   Historian Patient  HPI Stephanie [name] is a 51 y.o. female presents for evaluation of 24 hours of some left lower quadrant discomfort, states that she has a history of frequent urinary tract infections. She also states that she has a history of chronic headaches, migraines, and has had a headache since yesterday which is moderate to severe. It feels similar to prior headaches, however it was more acute onset and gradual which is a little different than typical headaches for her.  Over the past year or so she has had episodes where she will forget how she got to a certain location, feel like she is "blacking out" without any syncope or fall, or seizure activity noted.  No chest pain or palpitations.  No recent fever or coughing or trouble breathing.    Past Medical History:  Diagnosis Date  . Diabetes mellitus without complication ([place])   . Diverticulitis   . Hepatitis C   . Hypertension   . IBS (irritable bowel syndrome)   . Kidney stones   . Sciatica   . Stroke [facility])     Patient Active Problem List   Diagnosis Date Noted  . Small bowel obstruction 09/02/2016  . Pain and swelling of left upper extremity 07/22/2016  . Chronic thrombosis of left basilic vein 69/62/9528  . ICH (intracerebral hemorrhage) ([place])   . Essential hypertension, malignant 04/19/2016  . Cytotoxic brain edema ([place]) 04/19/2016  . IVH (intraventricular hemorrhage) ([place]) 04/18/2016  . Hepatitis C     Past Surgical History:  Procedure Laterality Date  . ABDOMINAL HYSTERECTOMY    . KIDNEY STONE SURGERY Right 02/12/12  . KIDNEY STONE SURGERY Left 07/15/2012  . LIGATION OF ARTERIOVENOUS  FISTULA Left 06/21/2016   Procedure: LIGATION OF ARTERIOVENOUS  FISTULA (  LIGATION BASILIC VEIN );  Surgeon: Katha Cabal, MD;  Location: [facility] ORS;  Service: Vascular;  Laterality: Left;  . LITHOTRIPSY    . OOPHORECTOMY    . SINUS EXPLORATION      Prior to Admission medications   Medication Sig Start Date End Date Taking? Authorizing Provider  alprazolam Duanne Moron) 2 MG tablet Take 2 mg by mouth 2 (two) times daily.    Yes Historical Provider, MD  amLODipine (NORVASC) 10 MG tablet Take 10 mg by mouth daily.   Yes Historical Provider, MD  atorvastatin (LIPITOR) 40 MG tablet Take 40 mg by mouth every evening.   Yes Historical Provider, MD  butalbital-acetaminophen-caffeine (FIORICET, ESGIC) 50-325-40 MG tablet Take 1 tablet by mouth 2 (two) times daily as needed for headache.   Yes Historical Provider, MD  cholecalciferol (VITAMIN D) 1000 units tablet Take 2,000 Units by mouth daily.   Yes Historical Provider, MD  clopidogrel (PLAVIX) 75 MG tablet Take 1 tablet (75 mg total) by mouth daily. 06/27/16  Yes Rosalin Hawking, MD  diphenoxylate-atropine (LOMOTIL) 2.5-0.025 MG tablet Take 1 tablet by mouth 4 (four) times daily as needed for diarrhea or loose stools.   Yes Historical Provider, MD  DULoxetine (CYMBALTA) 60 MG capsule Take 60 mg by mouth daily.   Yes Historical Provider, MD  glipiZIDE (GLUCOTROL) 5 MG tablet Take 1 tablet (5 mg total) by mouth 2 (two) times daily before a meal. Patient taking differently: Take 5 mg by mouth daily before breakfast.  04/30/16  Yes Anderson Malta  Pixie Casino, MD  INVOKANA 300 MG TABS tablet Take 300 mg by mouth daily before breakfast.    Yes Historical Provider, MD  levETIRAcetam (KEPPRA) 500 MG tablet Take 1 tablet (500 mg total) by mouth 2 (two) times daily. 04/30/16  Yes Milagros Loll, MD  losartan (COZAAR) 50 MG tablet Take 50 mg by mouth 2 (two) times daily.   Yes Historical Provider, MD  metoprolol (LOPRESSOR) 50 MG tablet Take 50 mg by mouth 2 (two) times daily.    Yes Historical Provider, MD  omeprazole (PRILOSEC) 40 MG capsule Take 40  mg by mouth daily.   Yes Historical Provider, MD  topiramate (TOPAMAX) 25 MG tablet Take 25 mg by mouth daily.   Yes Historical Provider, MD  zolpidem (AMBIEN) 10 MG tablet Take 10 mg by mouth at bedtime as needed for sleep.    Yes Historical Provider, MD  cephALEXin (KEFLEX) 500 MG capsule Take 1 capsule (500 mg total) by mouth 2 (two) times daily. 10/18/16   Lisa Roca, MD  cloNIDine (CATAPRES) 0.1 MG tablet Take 0.1 mg by mouth daily.     Historical Provider, MD  dicyclomine (BENTYL) 20 MG tablet Take 20 mg by mouth 2 (two) times daily.  07/26/15 07/25/16  Historical Provider, MD  hyoscyamine (LEVSIN SL) 0.125 MG SL tablet Place 0.125 mg under the tongue every 4 (four) hours as needed.    Historical Provider, MD  labetalol (NORMODYNE) 100 MG tablet Take 1 tablet (100 mg total) by mouth 3 (three) times daily. Patient not taking: Reported on 09/07/2016 04/30/16   Milagros Loll, MD  ondansetron (ZOFRAN ODT) 4 MG disintegrating tablet Take 1 tablet (4 mg total) by mouth every 8 (eight) hours as needed for nausea or vomiting. Patient not taking: Reported on 09/07/2016 07/29/16   Harvest Dark, MD    Allergies  Allergen Reactions  . Aspirin Itching  . Buprenorphine Hcl Itching  . Codeine Other (See Comments)    Makes her feel like something is crawling under her skin. Can take, sometimes makes her itch  . Compazine Itching    "makes my skin crawl"  . Ioxaglate Itching  . Ivp Dye [Iodinated Diagnostic Agents] Itching    Makes her itch really bad. Had to get 2-3 shots of Benadryl when she was in the hospital.  . Metrizamide Itching  . Naproxen Itching  . Norco [Hydrocodone-Acetaminophen] Itching  . Penicillin G Rash    Other reaction(s): Skin Rashes, Hives  . Penicillins Itching and Other (See Comments)    Makes her feel really uncomfortable. No other information available.  . Prochlorperazine Maleate Other (See Comments)    Compazine makes her skin crawl - needs 2-3 shots of  Benadryl to get relief.  . Reglan [Metoclopramide] Itching and Other (See Comments)    Makes her skin crawl  . Sulfur Rash    Other reaction(s): Skin Rashes, Hives  . Tegretol [Carbamazepine] Itching and Other (See Comments)    Makes her feel like something is crawling under her skin.  . Toradol [Ketorolac Tromethamine] Itching  . Tramadol Itching    Family History  Problem Relation Age of Onset  . Heart failure Mother   . Diabetes Father   . Cancer Sister     Social History Social History  Substance Use Topics  . Smoking status: Current Every Day Smoker    Packs/day: 0.25    Years: 1.50    Types: Cigarettes  . Smokeless tobacco: Never Used  . Alcohol use  No    Review of Systems  Constitutional: Negative for fever. Eyes: Negative for visual changes. ENT: Negative for sore throat. Cardiovascular: Negative for chest pain. Respiratory: Negative for shortness of breath. Gastrointestinal: Negative for abdominal pain, vomiting and diarrhea. Genitourinary: Negative for dysuria. Musculoskeletal: Negative for back pain. Skin: Negative for rash. Neurological: Negative for headache. 10 point Review of Systems otherwise negative ____________________________________________   PHYSICAL EXAM:  VITAL SIGNS: ED Triage Vitals  Enc Vitals Group     BP 10/18/16 1132 123/66     Pulse Rate 10/18/16 1132 70     Resp 10/18/16 1132 18     Temp 10/18/16 1132 98.2 F (36.8 C)     Temp Source 10/18/16 1132 Oral     SpO2 10/18/16 1132 97 %     Weight 10/18/16 1132 178 lb (80.7 kg)     Height 10/18/16 1132 _0  (1.575 m)     Head Circumference --      Peak Flow --      Pain Score 10/18/16 1137 9     Pain Loc --      Pain Edu? --      Excl. in [place]? --      Constitutional: Alert and oriented. Well appearing and in no distress. HEENT   Head: Normocephalic and atraumatic.      Eyes: Conjunctivae are normal. PERRL. Normal extraocular movements.  Funduscopic exam normal  bilaterally.      Ears:         Nose: No congestion/rhinnorhea.   Mouth/Throat: Mucous membranes are moist.   Neck: No stridor. Cardiovascular/Chest: Normal rate, regular rhythm.  No murmurs, rubs, or gallops. Respiratory: Normal respiratory effort without tachypnea nor retractions. Breath sounds are clear and equal bilaterally. No wheezes/rales/rhonchi. Gastrointestinal: Soft. No distention, no guarding, no rebound. Obese. Very mild discomfort to the left lower quadrant.  Genitourinary/rectal:Deferred Musculoskeletal: Nontender with normal range of motion in all extremities. No joint effusions.  No lower extremity tenderness.  No edema. Neurologic:  No facial droop. No aphasia. Normal speech and language. No gross or focal neurologic deficits are appreciated. Skin:  Skin is warm, dry and intact. No rash noted. Psychiatric: Mood and affect are normal. Speech and behavior are normal. Patient exhibits appropriate insight and judgment.   ____________________________________________  LABS (pertinent positives/negatives)  Labs Reviewed  CBC - Abnormal; Notable for the following:       Result Value   Hemoglobin 16.2 (*)    HCT 48.4 (*)    All other components within normal limits  URINALYSIS, COMPLETE (UACMP) WITH MICROSCOPIC - Abnormal; Notable for the following:    Color, Urine YELLOW (*)    APPearance CLEAR (*)    Specific Gravity, Urine 1.003 (*)    Glucose, UA >=500 (*)    Leukocytes, UA LARGE (*)    Bacteria, UA RARE (*)    Squamous Epithelial / LPF 6-30 (*)    All other components within normal limits  BLOOD GAS, VENOUS - Abnormal; Notable for the following:    pO2, Ven <31.0 (*)    Bicarbonate 33.5 (*)    Acid-Base Excess 6.4 (*)    All other components within normal limits  URINE CULTURE  BASIC METABOLIC PANEL  TROPONIN I  GLUCOSE, CAPILLARY  CBG MONITORING, ED    ____________________________________________    EKG I, Lisa Roca, MD, the attending  physician have personally viewed and interpreted all ECGs.  60 bpm. Normal sinus rhythm. Narrow QRS. Normal axis. Normal ST and  T-wave ____________________________________________  RADIOLOGY All Xrays were viewed by me. Imaging interpreted by Radiologist.  CT head without contrast:  IMPRESSION: No evidence of acute intracranial abnormality.  Left occipital-parietal encephalomalacia. __________________________________________  PROCEDURES  Procedure(s) performed: None  Critical Care performed: None  ____________________________________________   ED COURSE / ASSESSMENT AND PLAN  Pertinent labs & imaging results that were available during my care of the patient were reviewed by me and considered in my medical decision making (see chart for details).   Ms. Taitt is here with dual complaints of lower abdominal pain on the left side as well as headache. In terms of the lower abdominal discomfort, her labs are reassuring, but she does have evidence of urinary tract infection on urinalysis. She states that she has hives with penicillin, but has had Keflex in the past without a problem. Patient will be given a dose of Rocephin here and prescribed Keflex and urine culture was sent.  In terms of the headache, she does have a history of frequent and recurrent headaches, and the quality of the headache is similar. She states that only Phenergan and morphine work for the headache. I did end up giving HER-2 doses of symptomatic pain medication and she feels much better.  Head CT was obtained and was reassuring.  The only thing that was slightly unusual for her was morbidly acute onset rather than gradual onset. I discussed the diagnosis of subarachnoid hemorrhage with this patient, and discussed the next step options for further investigation would possibly include CT angiogram of the head, but she has a contrast allergy. Other possible workup could include lumbar puncture, but she did not  want to do lumbar puncture.  My overall suspicion for subarachnoid hemorrhage is extremely low. We did discuss return precautions. She would like to go on home tonight. I think this is reasonable.   CONSULTATIONS:  None   Patient / Family / Caregiver informed of clinical course, medical decision-making process, and agree with plan.   I discussed return precautions, follow-up instructions, and discharge instructions with patient and/or family.   ___________________________________________   FINAL CLINICAL IMPRESSION(S) / ED DIAGNOSES   Final diagnoses:  Headache, unspecified headache type  Urinary tract infection without hematuria, site unspecified              Note: This dictation was prepared with Dragon dictation. Any transcriptional errors that result from this process are unintentional    Lisa Roca, MD 10/18/16 1901

## 2016-10-18 NOTE — ED Notes (Signed)
Patient is ready to leave. EDP notified.

## 2016-10-18 NOTE — ED Notes (Signed)
Patient stated the morphine as helped her head but not her stomach. EDP said we could try tylenol. Informed patient, patient refused said she could take that at home with her nightly medications.

## 2016-10-18 NOTE — Discharge Instructions (Signed)
You were treated for headache, and although no certain cause was found, your exam and evaluation are overall reassuring in the emergency department tonight. We discussed possible lumbar puncture, and you declined this tonight.  Return to the emergency department immediately if you're having any worsening symptoms including worsening headache, vision changes, weakness, numbness, confusion or altered mental status, or any other symptoms concerning to you. Next line You are also found to have a urinary tract infection and are being treated with antibiotic called Keflex.

## 2016-10-20 LAB — URINE CULTURE

## 2016-10-21 ENCOUNTER — Ambulatory Visit (INDEPENDENT_AMBULATORY_CARE_PROVIDER_SITE_OTHER): Payer: PRIVATE HEALTH INSURANCE | Admitting: Vascular Surgery

## 2016-10-21 ENCOUNTER — Encounter (INDEPENDENT_AMBULATORY_CARE_PROVIDER_SITE_OTHER): Payer: PRIVATE HEALTH INSURANCE

## 2016-10-21 NOTE — Telephone Encounter (Signed)
Spoke to pt and was able to move appt up to 11-12-16 at 1315.  She verbalized understanding.  [facility] has [facility] there headache.  Had scan done and was treated with morphine which she stated has helped.

## 2016-11-12 ENCOUNTER — Ambulatory Visit: Payer: Self-pay | Admitting: Nurse Practitioner

## 2016-11-12 ENCOUNTER — Telehealth: Payer: Self-pay

## 2016-11-12 NOTE — Telephone Encounter (Signed)
Pt no show for appt on 10/01/2016 for Dr. Roda ShuttersXu see appt data.

## 2016-11-12 NOTE — Telephone Encounter (Signed)
NO show for appt today on 11/12/2016.

## 2016-11-13 ENCOUNTER — Encounter: Payer: Self-pay | Admitting: Nurse Practitioner

## 2016-12-09 ENCOUNTER — Ambulatory Visit: Payer: PRIVATE HEALTH INSURANCE | Admitting: Nurse Practitioner

## 2016-12-13 IMAGING — US US EXTREM  UP VENOUS*L*
1 series · 13 of 24 positions shown · non-contrast
Comparison: None.

CLINICAL DATA: Left arm pain for 1 month. Knot in the medial left
elbow area.

EXAM:
LEFT UPPER EXTREMITY VENOUS DOPPLER ULTRASOUND
TECHNIQUE: Gray-scale sonography with graded compression, as well as color
Doppler and duplex ultrasound were performed to evaluate the upper
extremity deep venous system from the level of the subclavian vein
and including the jugular, axillary, basilic and upper cephalic
vein. Spectral Doppler was utilized to evaluate flow at rest and
with distal augmentation maneuvers.

[Series 1: us extrem up venous*left* · 0.12mm/px · 13 of 36 slices shown]
[im 1/36]
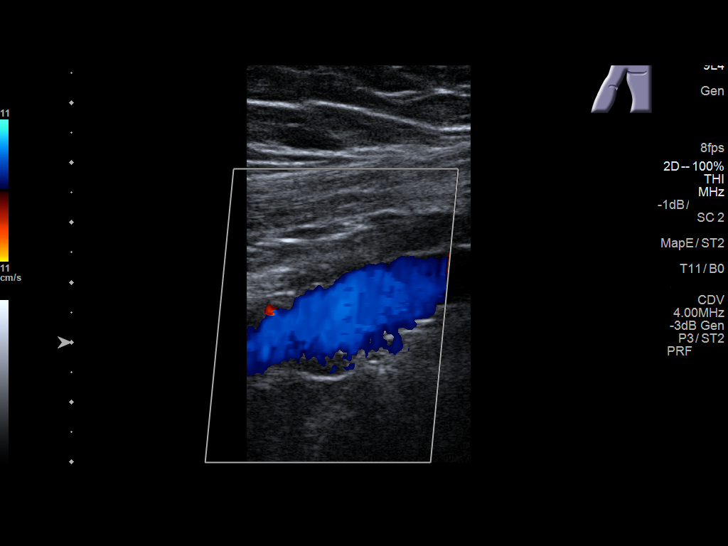
[im 4/36]
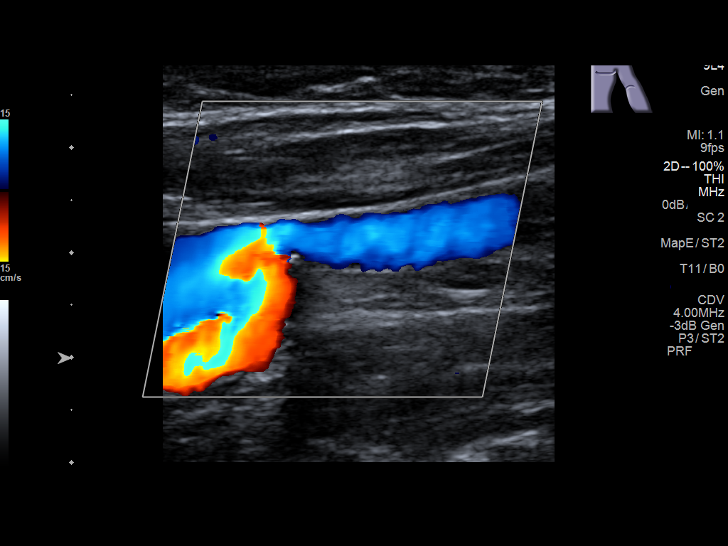
[im 7/36]
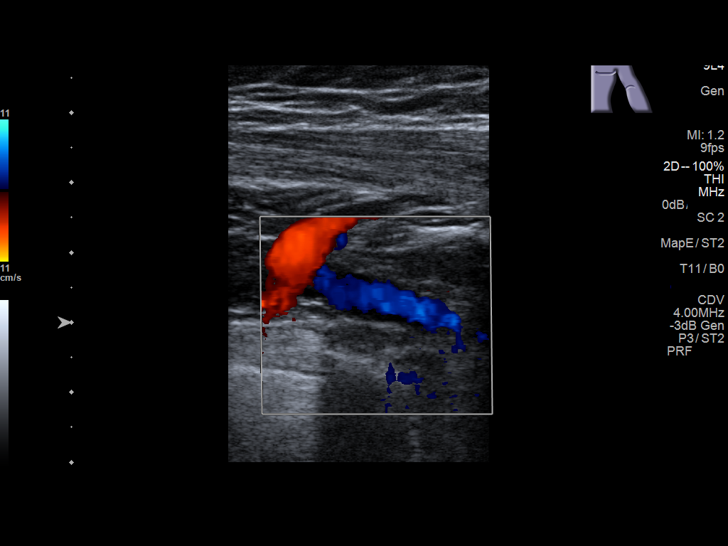
[im 10/36]
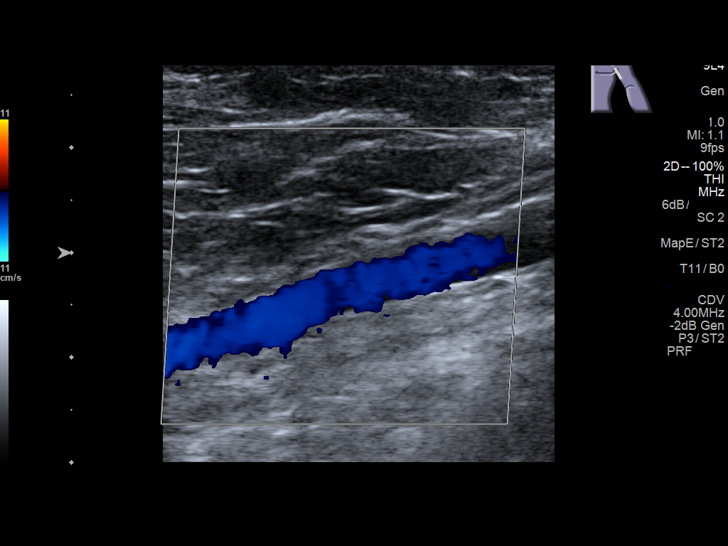
[im 13/36]
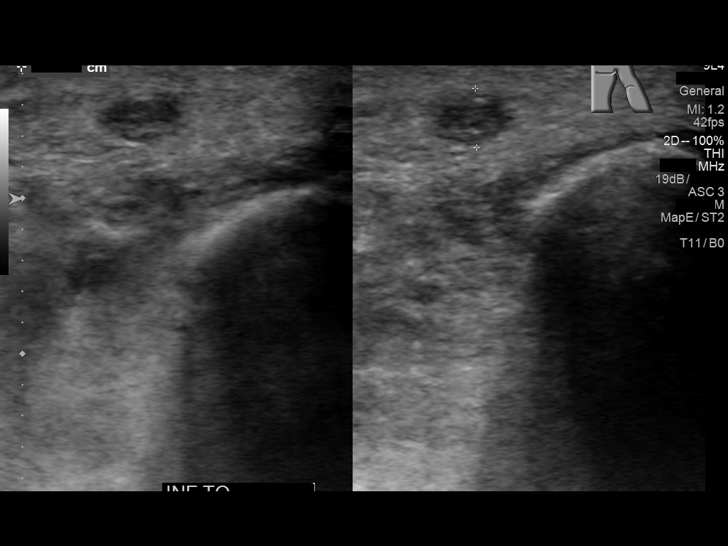
[im 16/36]
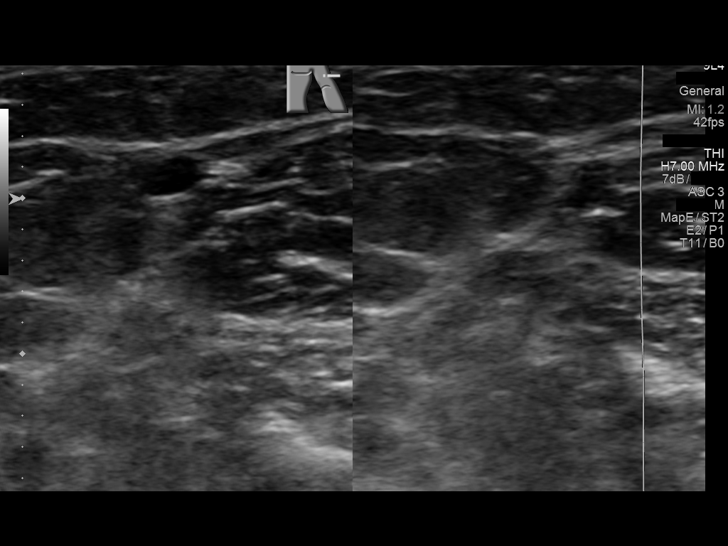
[im 19/36]
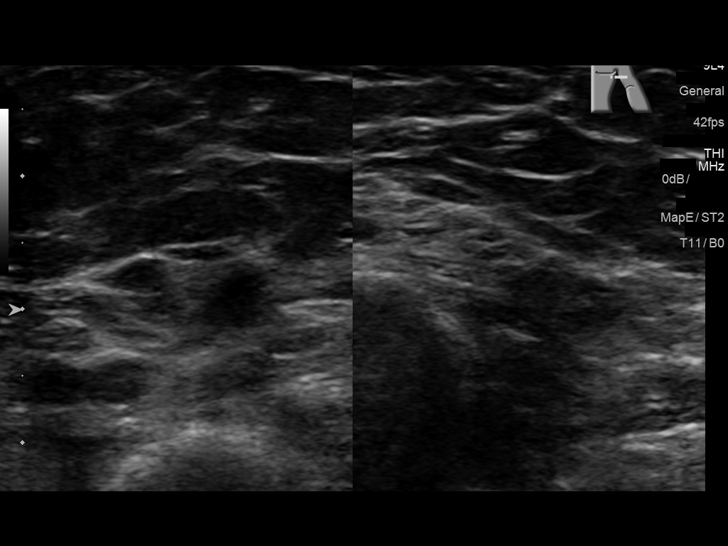
[im 20/36]
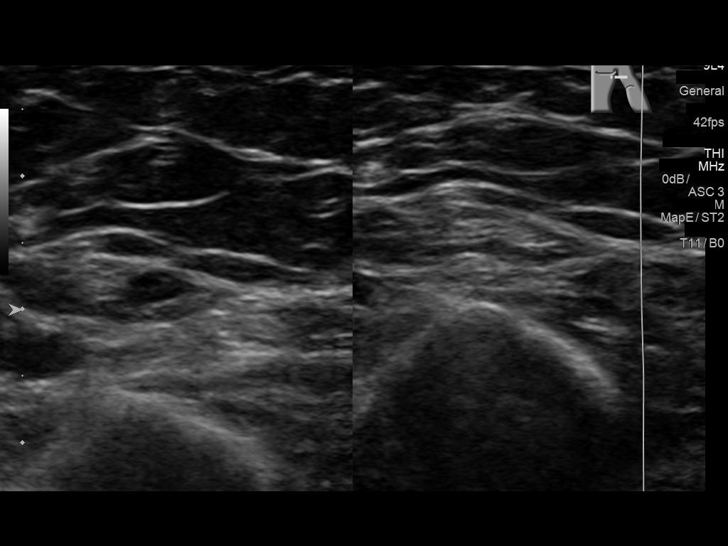
[im 23/36]
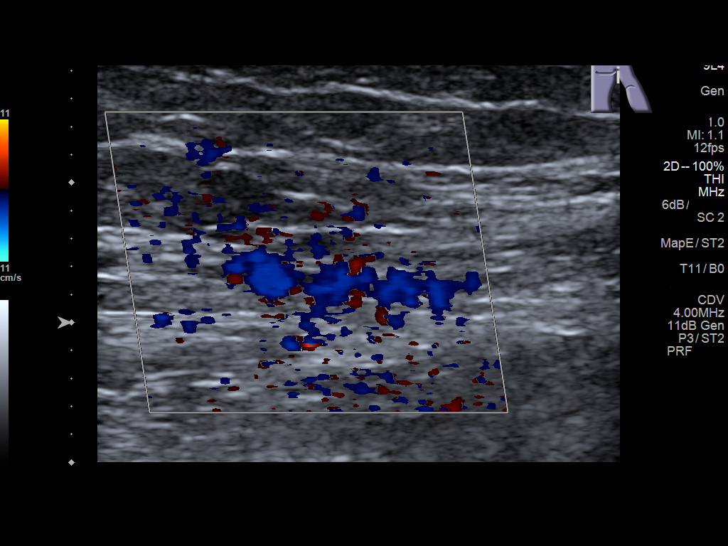
[im 26/36]
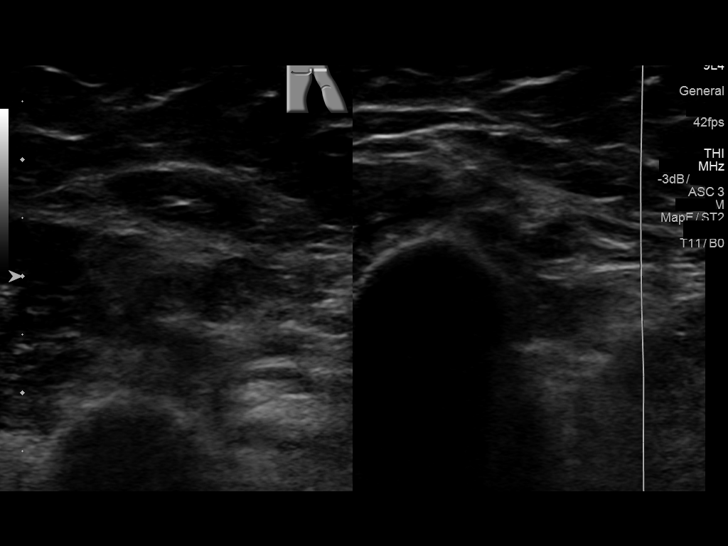
[im 29/36]
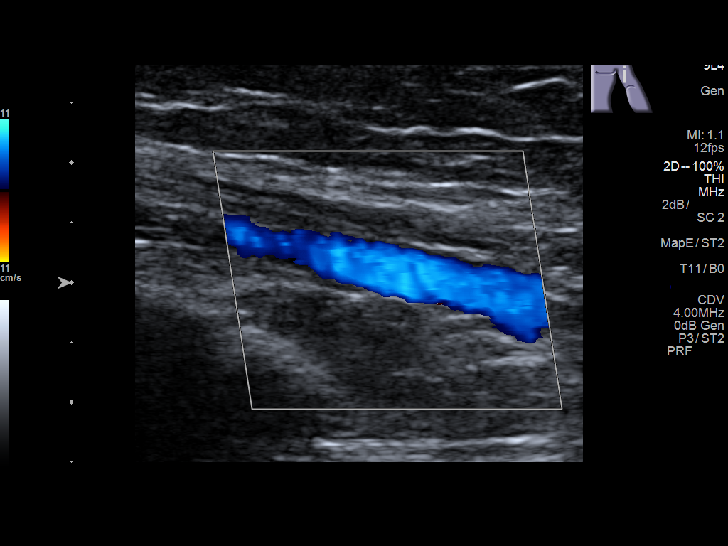
[im 32/36]
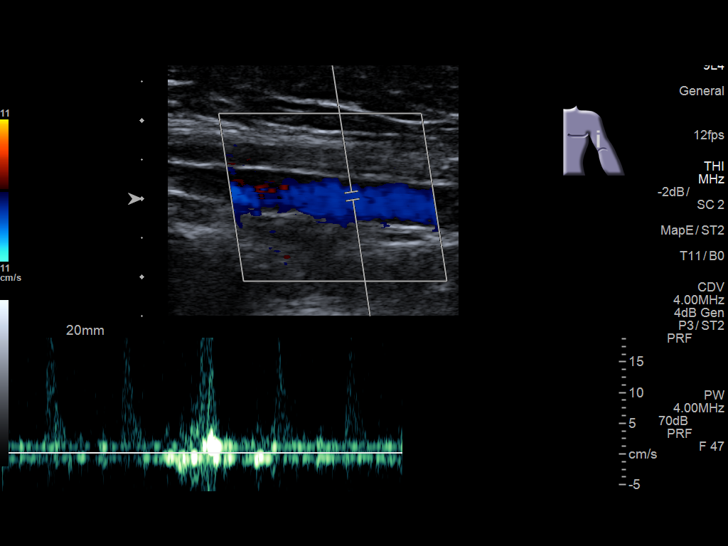
[im 36/36]
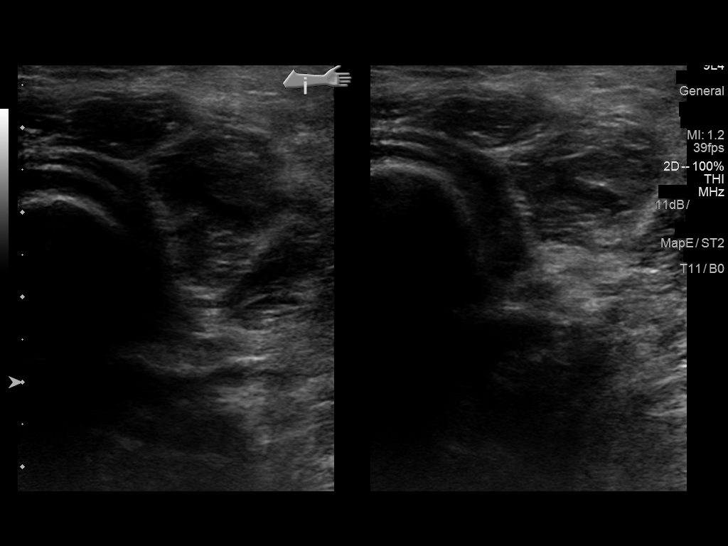

[13 of 24 positions shown; findings below may reference images not displayed]

FINDINGS: Right subclavian vein is patent. Normal compressibility, color
Doppler flow and phasicity in the left internal jugular vein. Left
subclavian vein is patent. Normal compression, color Doppler flow
and augmentation in the left axillary vein. Normal compressibility,
color Doppler flow and augmentation in the left cephalic vein.
Normal compressibility, color Doppler flow and augmentation in the
left brachial veins. Radial and ulnar veins are patent.

Superficial venous thrombosis involving the left basilic vein. The
left basilic vein thrombosis extends throughout the left forearm and
terminates near elbow. Upper arm left basilic vein appears to be in
patent with normal compressibility. Thrombosed left basilic vein is
quite large measuring up to 1.3 cm. This thrombosed vein appears to
account for the palpable knot near the elbow.
IMPRESSION: Superficial venous thrombosis involving the left basilic vein. The
thrombus extends throughout the left forearm and terminates near the
elbow. Segment of the thrombosed vein is quite large and accounts
for the palpable abnormality.

No deep venous thrombosis in the left upper extremity.

## 2016-12-29 IMAGING — CR DG RIBS W/ CHEST 3+V*L*
3 series · 3 of 3 positions shown · non-contrast
Comparison: Chest radiograph performed 08/09/2014

CLINICAL DATA: Status post fall down steps after slipping on dog
toy. Posterior left lower rib pain. Initial encounter.

EXAM:
LEFT RIBS AND CHEST - 3+ VIEW

[w chest pa]
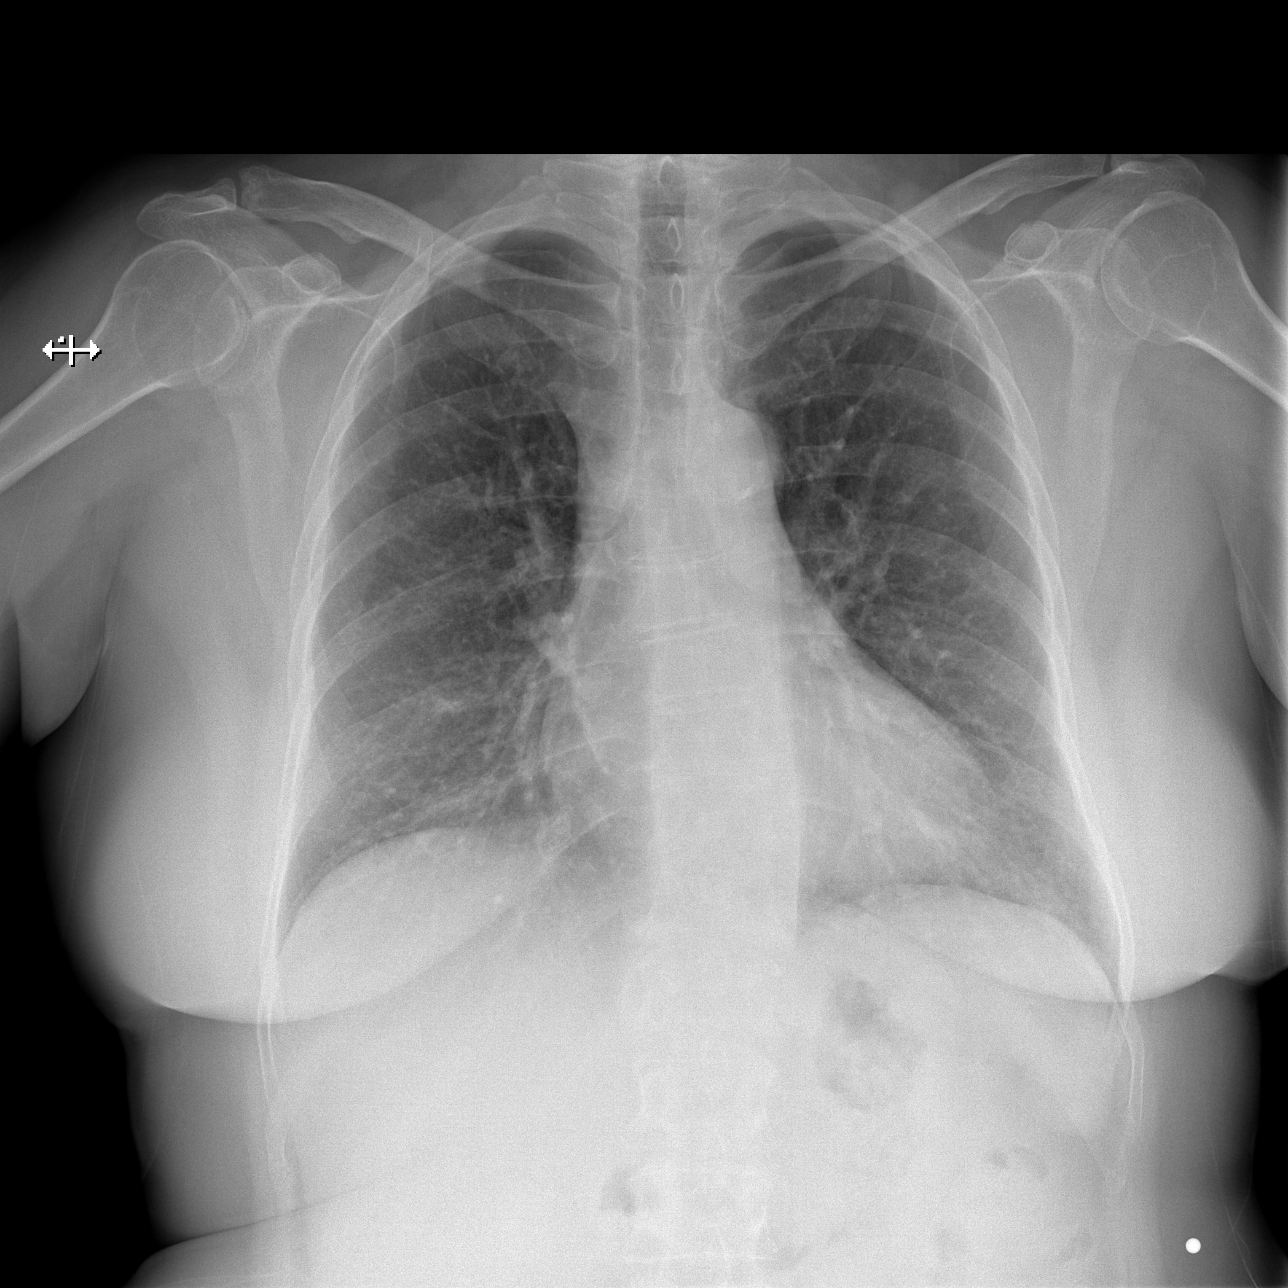

[w ribs ap upper left]
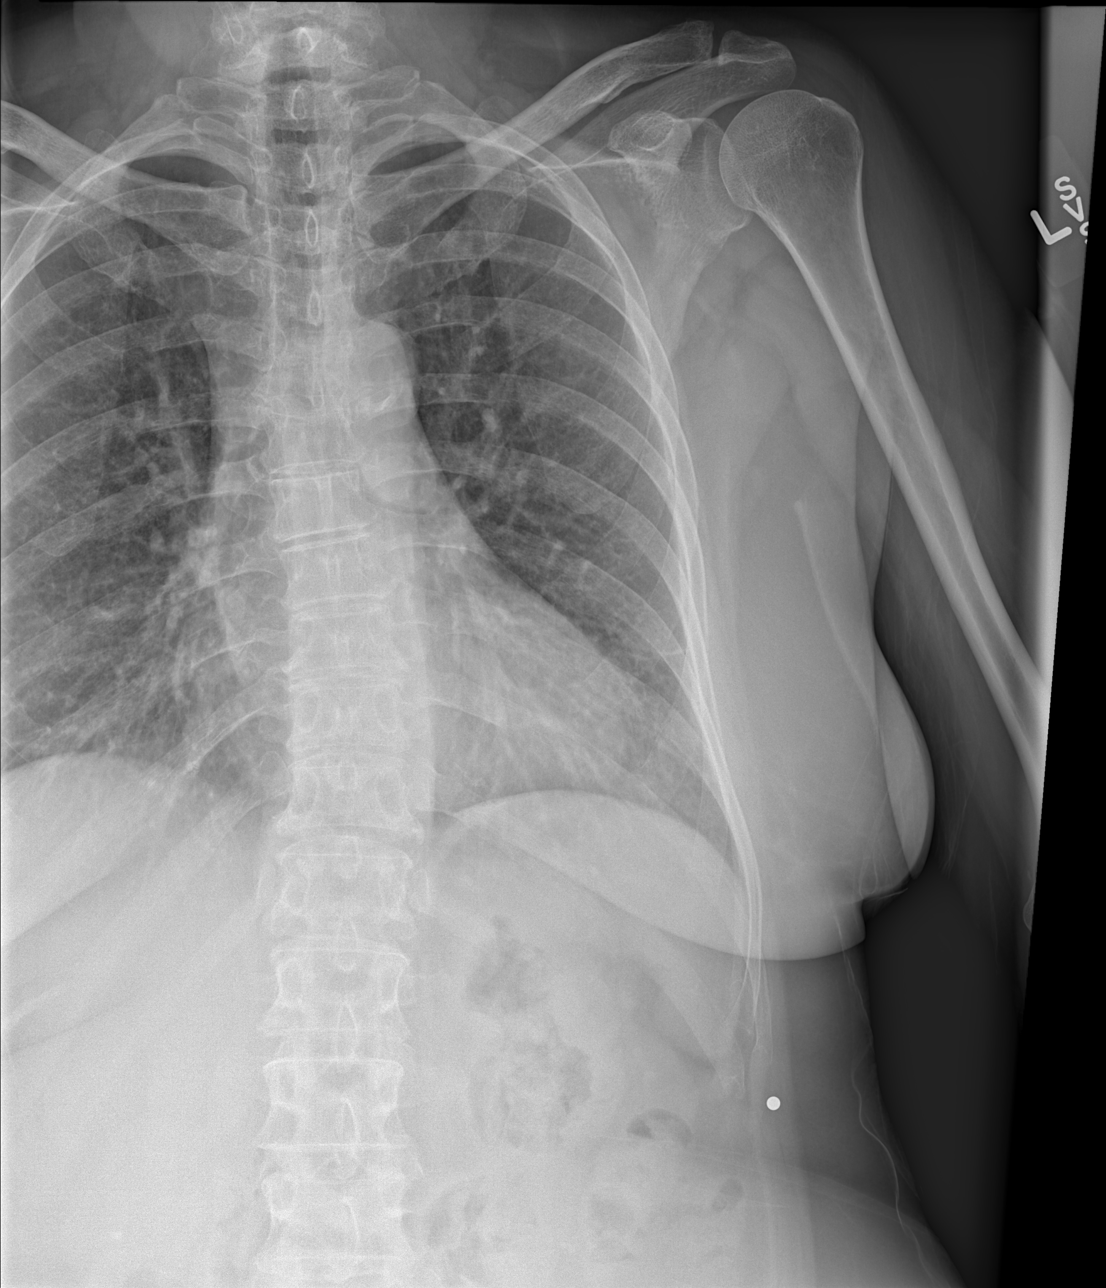

[w ribs ap lower left]
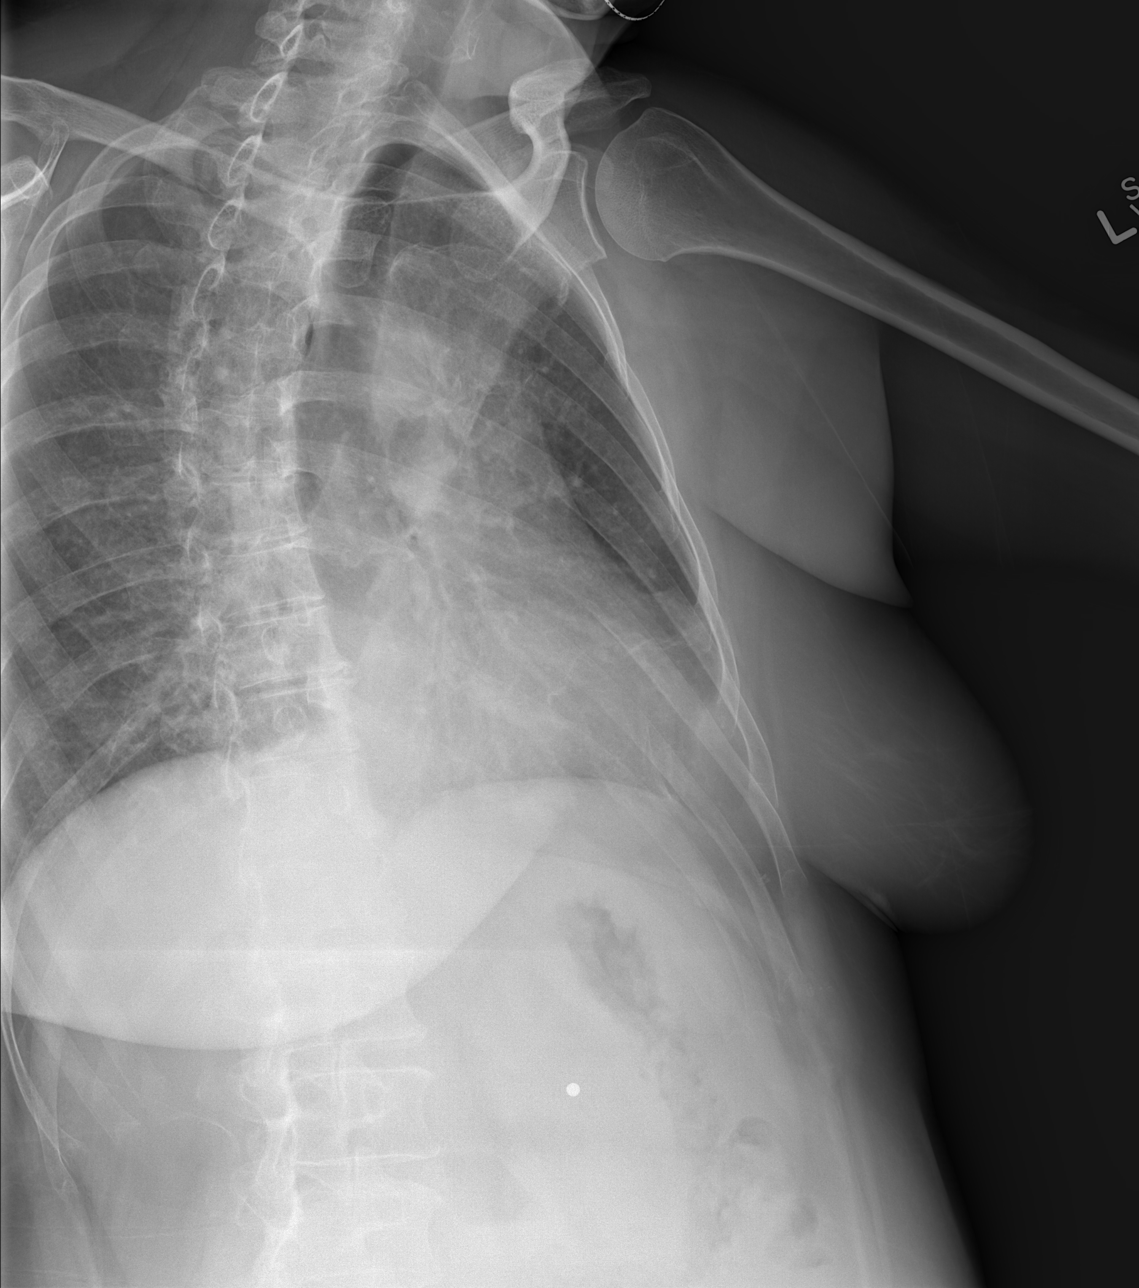

[3 of 3 positions shown; findings below may reference images not displayed]

FINDINGS: No displaced rib fractures are seen.

The lungs are well-aerated. Mild vascular congestion is noted.
Minimal bibasilar atelectasis is seen. There is no evidence of
pleural effusion or pneumothorax.

The cardiomediastinal silhouette is mildly enlarged. No acute
osseous abnormalities are seen.
IMPRESSION: 1. No displaced rib fractures identified.
2. Mild vascular congestion and mild cardiomegaly noted. Minimal
bibasilar atelectasis seen.

## 2017-01-07 ENCOUNTER — Encounter (INDEPENDENT_AMBULATORY_CARE_PROVIDER_SITE_OTHER): Payer: Self-pay

## 2017-01-07 ENCOUNTER — Other Ambulatory Visit: Payer: Self-pay

## 2017-01-07 ENCOUNTER — Encounter: Payer: Self-pay | Admitting: Neurology

## 2017-01-07 ENCOUNTER — Telehealth: Payer: Self-pay | Admitting: Neurology

## 2017-01-07 ENCOUNTER — Ambulatory Visit (INDEPENDENT_AMBULATORY_CARE_PROVIDER_SITE_OTHER): Payer: 59 | Admitting: Neurology

## 2017-01-07 VITALS — BP 115/82 | HR 73 | Wt 183.2 lb

## 2017-01-07 DIAGNOSIS — R519 Headache, unspecified: Secondary | ICD-10-CM

## 2017-01-07 DIAGNOSIS — I611 Nontraumatic intracerebral hemorrhage in hemisphere, cortical: Secondary | ICD-10-CM

## 2017-01-07 DIAGNOSIS — R51 Headache: Secondary | ICD-10-CM

## 2017-01-07 DIAGNOSIS — I82612 Acute embolism and thrombosis of superficial veins of left upper extremity: Secondary | ICD-10-CM | POA: Diagnosis not present

## 2017-01-07 DIAGNOSIS — I10 Essential (primary) hypertension: Secondary | ICD-10-CM

## 2017-01-07 MED ORDER — TOPIRAMATE 25 MG PO TABS: ORAL_TABLET | ORAL | 2 refills | Status: DC

## 2017-01-07 NOTE — Telephone Encounter (Signed)
Rx sent to [facility], and already receive per epic.

## 2017-01-07 NOTE — Patient Instructions (Addendum)
-   continue plavix for stroke and DVT prevention - repeat CTV to rule out clot in brain - check BP and glucose at home - continue Keppra for now and will repeat EEG.  - Follow up with your primary care physician for stroke risk factor modification. Recommend maintain blood pressure goal <130/80, diabetes with hemoglobin A1c goal below 7.0% and lipids with LDL cholesterol goal below 70 mg/dL.  - keep hydration - will do occipital nerve block tomorrow - will increase topamax dose to help headache - if headache not improving, may consider spinal tap to check pressure in the brain - follow up in 3 months with me

## 2017-01-07 NOTE — Progress Notes (Signed)
STROKE NEUROLOGY FOLLOW UP NOTE  NAME: Stephanie [name] DOB: 04/26/1966  REASON FOR VISIT: stroke follow up HISTORY FROM: Patient and chart  Today we had the pleasure of seeing Stephanie [name] in follow-up at our Neurology Clinic. Pt was accompanied by husband.   History Summary Stephanie [name] a 51 y.o.femalewith history of hepatitis C,sciatica, CHF, and drug seeking behavioradmitted on 04/18/2016 to [place] with complaints of diffuse pain and HA. CT showed a moderate Lpar[place]o-occipital ICH with IVH and SAH.She was transferred to [facility] for admission. MRI without contrast hematoma stable. MRA negative. Repeat CT hematoma stable, however, concerning for left transverse sinus thrombosis. MRV also suggest non-occlusive left transverse sinus thrombosis. Put on IV heparin with close neuro monitoring. LDL 60 and A1c 8.1. TTE EF 65-70%. Headache controlled with Fioricet. Due to confusion, EEG was done showing positive for left temporal sharp wave discharges and generalized irregular slow activity. Put on Keppra, repeat EEG showed FIRDA and moderate diffuse slowing. BP controlled by PO medications.   However, after several days of the heparin IV treatment, a CTV performed and suggested bilateral transverse sinus nonocclusive arachnoid granulation without convincing evidence of dural venous sinus thrombosis. IV heparin discontinued. She was discharged without antiplatelet or anticoagulation, but with Keppra and BP/DM meds.  Follow up 06/27/17 - patient mental status improved and now at baseline. She continued to have headache, 3-4 times a week. Headache normal he start in the morning after getting up, left frontal HA with soreness of the left eye, pressure feeling, 7/10, lasting several hours. Normally ease off after lunch. Denies photophobia or phonophobia. Fioricet helps to abort the HA. She usually takes 1 tablet per day. The current headache is different from her previous migraine. For  the last 20 years she had history of migraine, usually at the right side with right eye pain, associated with N/V, photophobia, phonophobia, has to go to dark room. 2-3 times per months, relief without medication. She tried Imitrex before does not help.  For the last 1 months, she started to have a knot at left elbow up to her arm and had ultrasound at with vascular surgery in [place] found to have large superficial venous thrombosis (basilic vein), but no DVT. She continued to complain of increased pain and numbness of left hand, and repeat ultrasound 06/19/16 showed propagation of thrombus proximally and distally. The clot extended up to the axillary vein. She had basilic vein ligation done on 06/21/16. Currently still not on any antiplatelet.  Interval History During the interval time, pt has been doing well from stroke standpoint. No more clot developed and repeat UE and LE venous Doppler showed no DVT. Hypercoagulable and autoimmune labs on negative. CTV has not done yet. Patient continued to complain headache, more localized at the back of the head and neck area. Fioricet not helping anymore. She had ER visit in 10/2016 for HA, head CT negative, received pain medication and felt better. She is on Topamax, but only on 25 mg daily, low-dose. For the last 3 weeks, she complained her headache never went away. Complaint of intermittent confusion and sometimes memory gaps.   REVIEW OF SYSTEMS: Full 14 system review of systems performed and notable only for those listed below and in HPI above, all others are negative:  Constitutional:  Fatigue Cardiovascular:  Ear/Nose/Throat:   Skin:  Eyes:  Double vision, blurry vision Respiratory:  SOB Gastroitestinal:  Abdominal pain, diarrhea, nausea Genitourinary:  Hematology/Lymphatic:   Endocrine: Excessive thirst Musculoskeletal:  Back  pain, muscle cramps, neck pain, neck stiffness Allergy/Immunology:   Neurological:  Dizziness, headache Psychiatric:  Confusion Sleep: Apnea, sleep talking  The following represents the patient's updated allergies and side effects list: Allergies  Allergen Reactions  . Aspirin Itching  . Buprenorphine Hcl Itching  . Codeine Other (See Comments)    Makes her feel like something is crawling under her skin. Can take, sometimes makes her itch  . Compazine Itching    "makes my skin crawl"  . Ioxaglate Itching  . Ivp Dye [Iodinated Diagnostic Agents] Itching    Makes her itch really bad. Had to get 2-3 shots of Benadryl when she was in the hospital.  . Metrizamide Itching  . Naproxen Itching  . Norco [Hydrocodone-Acetaminophen] Itching  . Penicillin G Rash    Other reaction(s): Skin Rashes, Hives  . Penicillins Itching and Other (See Comments)    Makes her feel really uncomfortable. No other information available.  . Prochlorperazine Maleate Other (See Comments)    Compazine makes her skin crawl - needs 2-3 shots of Benadryl to get relief.  . Reglan [Metoclopramide] Itching and Other (See Comments)    Makes her skin crawl  . Sulfur Rash    Other reaction(s): Skin Rashes, Hives  . Tegretol [Carbamazepine] Itching and Other (See Comments)    Makes her feel like something is crawling under her skin.  . Toradol [Ketorolac Tromethamine] Itching  . Tramadol Itching    The neurologically relevant items on the patient's problem list were reviewed on today's visit.  Neurologic Examination  A problem focused neurological exam (12 or more points of the single system neurologic examination, vital signs counts as 1 point, cranial nerves count for 8 points) was performed.  Blood pressure 115/82, pulse 73, weight 183 lb 3.2 oz (83.1 kg).  General - Well nourished, well developed, in no apparent distress.  Ophthalmologic - Fundi not visualized due to eye movement.  Cardiovascular - Regular rate and rhythm with no murmur.  Neck - tenderness on touch at occipital nerve bilaterally, right > left  Mental  Status -  Level of arousal and orientation to time, place, and person were intact. Language including expression, naming, repetition, comprehension was assessed and found intact. Fund of Knowledge was assessed and was intact.  Cranial Nerves II - XII - II - Visual field intact OU. III, IV, VI - Extraocular movements intact. V - Facial sensation intact bilaterally. VII - Facial movement intact bilaterally. VIII - Hearing & vestibular intact bilaterally. X - Palate elevates symmetrically. XI - Chin turning & shoulder shrug intact bilaterally. XII - Tongue protrusion intact.  Motor Strength - The patient's strength was normal in all extremities and pronator drift was absent.  Bulk was normal and fasciculations were absent.   Motor Tone - Muscle tone was assessed at the neck and appendages and was normal.  Reflexes - The patient's reflexes were 1+ in all extremities and she had no pathological reflexes.  Sensory - Light touch, temperature/pinprick, vibration and proprioception, and Romberg testing were assessed and were normal.    Coordination - The patient had normal movements in the hands and feet with no ataxia or dysmetria.  Tremor was absent.  Gait and Station - The patient's transfers, posture, gait, station, and turns were observed as normal.   Data reviewed: I personally reviewed the images and agree with the radiology interpretations.  Ct Head Wo Contrast 04/24/2016 Left posterior temporal hemorrhage unchanged. There is possible thrombosis in the left transverse sinus as  the cause of the hemorrhage. CT venogram recommended for confirmation.  04/18/2016 No significant change since the prior study with 2.8 x 4.1 cm left parietal intraparenchymal hemorrhage with intraventricular extension and 2 mm left to right midline shift with some effacement of the left basilar cisterns.   04/18/2016 Large intraparenchymal hemorrhage is noted in left posterior parietal cortex with extension  into the left lateral and third ventricles. Small amount of left-sided subarachnoid hemorrhage is noted. 4 mm of left-to-right midline shift is noted.   MRI HEAD  04/20/2016 1. Similar size and appearance of acute right temporal occipital intraparenchymal hematoma measuring 4.0 x 2.8 x 2.7 cm (estimated volume 15 cc). Similar localized edema with trace 3 mm left-to-right shift.  2. Intraventricular extension with blood in the lateral and third ventricles. No evidence for hydrocephalus or ventricular trapping.  3. Small volume acute subarachnoid hemorrhage within the posterior cerebral hemispheres bilaterally, stable from prior CT.  4. Trace left subdural hemorrhage measuring up to 2 mm, difficult to visualize on prior study, but felt to be unchanged.   MRA HEAD  04/20/2016 1. Negative intracranial MRA. No vascular abnormality identified underlying the acute hemorrhage.  2. No large or proximal arterial branch occlusion. No high-grade or correctable stenosis.  3. Multi focal atheromatous irregularity involving the anterior posterior circulations as above, predominantly involving the distal small vessels.   MRV Head 04/24/2016 Filling defect at the junction of the LEFT transverse and sigmoid sinus consistent with nonocclusive LEFT dural venous sinus thrombosis.  2D Echocardiogram  - Procedure narrative: Transthoracic echocardiography. Imagequality was poor. The study was technically difficult, as aresult of poor sound wave transmission.  - Left ventricle: The cavity size was normal. Wall thickness wasincreased in a pattern of mild LVH. There was mild focal basalhypertrophy of the septum. Systolic function was vigorous. Theestimated ejection fraction was in the range of 65% to 70%. Wallmotion was normal; there were no regional wall motionabnormalities. Doppler parameters are consistent with abnormalleft ventricular relaxation (grade 1 diastolic dysfunction). Impressions:   Vigorous LV  systolic function; grade 1 diastolic dysfunction.  EEG 04/24/16 1) left temporal sharp wave discharges 2) generalized irregular slow activity Clinical Interpretation: This EEG indicates a potential seizure focus in the left temporal region. No seizure was recorded on this study.  EEG 04/26/16 This awake and asleepEEG is abnormal due to the presence of: 1. Moderatediffuse slowing of the waking background 2. Frontal intermittent rhythmic delta activity (FIRDA) Clinical Correlation of the above findings indicates diffuse cerebral dysfunction that is non-specific in etiology and can be seen with hypoxic/ischemic injury, toxic/metabolic encephalopathies, or medication effect. FIRDA can be seen with neuronal dysfunction from metabolic encephalopathy, ischemic injury, increased intracranial pressure, or from deep diencephalic or brainstem lesions. The absence of epileptiform discharges does not rule out a clinical diagnosis of epilepsy. Clinical correlation is advised.  CTV 04/29/16 CT HEAD: Evolving LEFT parietal occipital lobe hemorrhage with local mass effect, no midline shift. Trace LEFT occipital lobe subarachnoid hemorrhage, alternately thrombosed cortical vein. CTV head: Bilateral transverse sinus nonocclusive arachnoid granulation without convincing evidence of dural venous sinus thrombosis. Abnormal enhancement along the inferior margin of the hemorrhage, though this could represent transient hyperemia, underlying vascular malformation or mass is a consideration. Recommend 6 week follow-up MRI of the head with contrast. Alternatively CTA or catheter angiography could be done sooner.  UE venous doppler 05/27/16 - Superficial venous thrombosis involving the left basilic vein. The thrombus extends throughout the left forearm and terminates near the elbow. Segment of  the thrombosed vein is quite large and accounts for the palpable abnormality. No deep venous thrombosis in the left upper  extremity.  UE venous doppler 06/19/16 -  Occlusive, superficial vein thrombosis of left basilic vein upper arm and forearm extending to brachial/axillary confluence. No evidence of DVT. Comparing with previous study, there is a propagation of left upper extremity superficial thrombosis.  UE and LE venous doppler 07/19/16 - no DVT  CT head 10/18/16 No evidence of acute intracranial abnormality. Left occipital-parietal encephalomalacia.  Component     Latest Ref Rng & Units 04/18/2016 04/22/2016  Cholesterol     0 - 200 mg/dL  962  Triglycerides     <150 mg/dL  91  HDL Cholesterol     >40 mg/dL  40 (L)  Total CHOL/HDL Ratio     RATIO  3.0  VLDL     0 - 40 mg/dL  18  LDL (calc)     0 - 99 mg/dL  60  Hemoglobin X5M     4.8 - 5.6 %  8.1 (H)  Mean Plasma Glucose     mg/dL  841  TSH     3.244 - 0.102 uIU/mL 1.471   T4,Free(Direct)     0.61 - 1.12 ng/dL 7.25    Component     Latest Ref Rng & Units 06/27/2016  Hcys SerPl-sCnc     umol/L 14.3  Factor VIII Activity     % 142  AT III Act/Nor PPP Chro     % 84  Prot C Ag Act/Nor PPP Imm     % 99  Prot S Ag Act/Nor PPP Imm     % 106  Factor VII Antigen**     % 134  Protein C Ag/FVII Ag Ratio**     ratio 0.7  Protein S Ag/FVII Ag Ratio**     ratio 0.8  Act. Prt C Resist w/FV Defic.     ratio 3.0  APTT     sec 25.0  APTT 1:1 NP     sec CANCELED  APTT 1:1 Saline     Sec CANCELED  LAC Interpretation      Comment  DRVVT Screen Seconds     sec 49.2 (H)  DRVVT Confirm Seconds     sec 36.3  DRVVT Ratio     ratio 1.2  Hexagonal Phospholipid Neutral     sec 7  Anticardiolipin Ab, IgG     GPL 12  Anticardiolipin Ab, IgM     MPL <10  Beta-2 Glycoprotein I, IgG     SGU <10  Beta-2 Glycoprotein I, IgM     SMU <10  Beta-2 Glycoprotein I, IgA     SAU <10  Pathologist Interpretation      CANCELED  Factor II Gene Mutation Result      Comment  Interpretation      Comment  Methodology      Comment  Comments       Comment  ENA RNP Ab     0.0 - 0.9 AI <0.2  ENA SM Ab Ser-aCnc     0.0 - 0.9 AI <0.2  RA Latex Turbid.     0.0 - 13.9 IU/mL 10.5  Chromatin Ab SerPl-aCnc     0.0 - 0.9 AI <0.2  ENA SSA (RO) Ab     0.0 - 0.9 AI 0.2  ENA SSB (LA) Ab     0.0 - 0.9 AI <0.2  dsDNA Ab     0 -  9 IU/mL 1    Assessment: As you may recall, she is a 51 y.o. Caucasian female with PMH of hepatitis C,sciatica, CHF, and drug seeking behavioradmitted on 04/18/2016 for a moderate Lparieto-occipital ICH with IVH and SAH.MRI without contrast hematoma stable. MRA negative. Repeat CT hematoma stable, however, concerning for left transverse sinus thrombosis. MRV also suggest non-occlusive left transverse sinus thrombosis. Put on IV heparin with close neuro monitoring. LDL 60 and A1c 8.1. TTE EF 65-70%. Headache controlled with Fioricet. EEG showed positive for left temporal sharp wave discharges and generalized irregular slow activity. Put on Keppra, repeat EEG showed FIRDA and moderate diffuse slowing. BP controlled by PO medications. However, after several days of the heparin IV treatment, a CTV performed and suggested bilateral transverse sinus nonocclusive arachnoid granulation without convincing evidence of dural venous sinus thrombosis. IV heparin discontinued. She was discharged without antiplatelet or anticoagulation. During the interval time, patient mental status improved and now at baseline. Developed large superficial venous thrombosis (basilic vein), but no DVT. And repeat ultrasound on 06/19/16 showed propagation of thrombus proximally and distally. The clot extended up to the axillary vein. She had basilic vein ligation done on 06/21/16. Patient has allergy of aspirin. On plavix now, hypercoagulable and autoimmune labs unremarkable.  She continued to have headache, 3-4 times a week, lasting several hours each. Fioricet was helping initially but not anymore. She had ER visit in 10/2016 for HA, CT head negative. She is  on Topamax, but only on 25 mg daily, low-dose. For the last 3 weeks, she complained her headache never went away. Will increase topamax dose. Also found to have occipital neuralgia, will do occipital block tomorrow. Increase topamax dose. If not effective, may consider LP checking OP. She need to be off plavix 5-7 days for LP. Complaint of intermittent confusion and sometimes memory gaps. She is on keppra, will increase topamax and will do EEG.   Plan:  - continue plavix for stroke and DVT prevention - repeat MRV to rule out CVST - check BP and glucose at home - continue Keppra for now and will repeat EEG - Follow up with your primary care physician for stroke risk factor modification. Recommend maintain blood pressure goal <130/80, diabetes with hemoglobin A1c goal below 7.0% and lipids with LDL cholesterol goal below 70 mg/dL.  - keep hydration - will do occipital nerve block tomorrow - will increase topamax dose to  bid to help headache - if headache not improving, may consider spinal tap to check pressure in the brain. Pt needs to be off plavix for 5-7 days before LP - follow up in 3 months with me   A total of 25 minutes was spent face-to-face with this patient. Over half this time was spent on counseling patient on the headache management, medication changes, repeat EEG. Medical decision making is complicated in this patient.   Orders Placed This Encounter  Procedures  . MR MRV HEAD WO CM    Standing Status:   Future    Standing Expiration Date:   03/09/2018    Order Specific Question:   Reason for Exam (SYMPTOM  OR DIAGNOSIS REQUIRED)    Answer:   rule out CVST    Order Specific Question:   What is the patient's sedation requirement?    Answer:   No Sedation    Order Specific Question:   Does the patient have a pacemaker or implanted devices?    Answer:   No    Order Specific Question:  Preferred imaging location?    Answer:   Internal    Order Specific Question:   Radiology  Contrast Protocol - do NOT remove file path    Answer:   [email or web address]  . EEG adult    Standing Status:   Future    Standing Expiration Date:   01/07/2018    Meds ordered this encounter  Medications  . DISCONTD: Butalbital-APAP-Caffeine 50-300-40 MG CAPS  . Diclofenac Sodium 1.5 % SOLN    Sig: as needed.   . Doxepin HCl 5 % CREA    Sig: as needed.   . Fluocinonide 0.1 % CREA    Sig: as needed.   Marland Kitchen LIVIXIL PAK cream  . DISCONTD: hyoscyamine (LEVSIN, ANASPAZ) 0.125 MG tablet  . DISCONTD: omeprazole (PRILOSEC) 40 MG capsule    Sig: Take by mouth.  . loratadine (CLARITIN) 10 MG tablet    Sig: Take 10 mg by mouth daily.  Marland Kitchen DISCONTD: topiramate (TOPAMAX) 25 MG tablet    Sig:  bid for 7 days and then  bid    Dispense:  120 tablet    Refill:  2    Patient Instructions  - continue plavix for stroke and DVT prevention - repeat CTV to rule out clot in brain - check BP and glucose at home - continue Keppra for now and will repeat EEG.  - Follow up with your primary care physician for stroke risk factor modification. Recommend maintain blood pressure goal <130/80, diabetes with hemoglobin A1c goal below 7.0% and lipids with LDL cholesterol goal below 70 mg/dL.  - keep hydration - will do occipital nerve block tomorrow - will increase topamax dose to help headache - if headache not improving, may consider spinal tap to check pressure in the brain - follow up in 3 months with me    Marvel Plan, MD PhD [facility] [address], [address] [place], [place] [address] [phone]

## 2017-01-07 NOTE — Telephone Encounter (Signed)
Pt called said the RX for topiramate (TOPAMAX) 25 MG tablet should have been sent to [facility]. She said her husband will pick up RX tomorrow afternoon. She is aware it was sent to a different pharmacy by mistake. Thank you

## 2017-01-07 NOTE — Telephone Encounter (Signed)
RN call [facility] mail order pharmacy and spoke with rep. Rn stated the MD mistakenly sent Topamax for refill.Rn stated it needs to be cancel. PT would like medication sent to [facility] pharmacy.

## 2017-01-08 ENCOUNTER — Ambulatory Visit (INDEPENDENT_AMBULATORY_CARE_PROVIDER_SITE_OTHER): Payer: 59 | Admitting: Neurology

## 2017-01-08 ENCOUNTER — Encounter: Payer: Self-pay | Admitting: Neurology

## 2017-01-08 VITALS — BP 113/79 | HR 70 | Wt 182.2 lb

## 2017-01-08 DIAGNOSIS — M5481 Occipital neuralgia: Secondary | ICD-10-CM

## 2017-01-08 DIAGNOSIS — R51 Headache: Secondary | ICD-10-CM

## 2017-01-08 DIAGNOSIS — R519 Headache, unspecified: Secondary | ICD-10-CM

## 2017-01-09 ENCOUNTER — Telehealth: Payer: Self-pay

## 2017-01-09 ENCOUNTER — Ambulatory Visit (INDEPENDENT_AMBULATORY_CARE_PROVIDER_SITE_OTHER): Payer: 59 | Admitting: Neurology

## 2017-01-09 ENCOUNTER — Other Ambulatory Visit: Payer: Self-pay | Admitting: Neurology

## 2017-01-09 DIAGNOSIS — I611 Nontraumatic intracerebral hemorrhage in hemisphere, cortical: Secondary | ICD-10-CM

## 2017-01-09 MED ORDER — PREDNISONE 10 MG PO TABS: ORAL_TABLET | ORAL | 0 refills | Status: DC

## 2017-01-09 NOTE — Telephone Encounter (Signed)
I sent a prescription for a steroid pack to her pharmacy

## 2017-01-09 NOTE — Telephone Encounter (Signed)
Rn was in waiting room and saw patient. Pt was there to get her EEG done. Pt was prescribed topamax on 01/08/2017, and given a occipital nerve block by Dr. Roda ShuttersXu. PT stated to nurse in waiting room she still had a headache. PT wanted something else for a headache. Pt stated" if this headache does not go away I will have to seek the ED". Rn advised pt to get her EEG, and lab work, and the work in md will advise her. Pt verbalized understanding.

## 2017-01-09 NOTE — Progress Notes (Signed)
Patient reported continued headache. A steroid pack will be sent into pharmacy

## 2017-01-09 NOTE — Progress Notes (Signed)
Procedure Date:  01/08/17 Procedure: Occipital Nerve Block, right  Pre-procedure Diagnosis: Headache  Post-procedure Diagnosis: same as above  Prior to Procedure:  Informed Consent: The risks, benefits, indications, potential complications, and alternatives were explained to the patient/family and informed consent obtained.  Attending Staff:  RN, [name] Skin Prep: Cleansed with alcohol.  Anesthesia: 70m Lidocaine 1% without epinephrine without added sodium bicarbonate, decadron 127m(62m41mIndications: Ms. PhiBedonie a [age] female here for right occipital neuralgia. The identity of the patient was confirmed and a bedside time out was performed.  Description of Procedure: Pt's skin was prepped with alcohol and lidocaine 1% and decadron 62mg29m was injected over the occipital ridge in a fan-like fashion with appropriate procedure on the right. Pt had partial relief.  Findings: partial HA relief  Complications: The patient tolerated the procedure well with no complications.  Specimens:none  Estimated blood loss: zero    Pt came in with right sided headache at the back of head and at right temporal region radiating to the behind of right eye. After occipital nerve black on the right, the pain at the back of head got immediate relief, the right temporal pain partial relief. Did not feel any right temporal A hardening. Will check ESR and CRP.   JindRosalin Hawking PhD Stroke Neurology 01/09/2017 7:09 AM

## 2017-01-09 NOTE — Telephone Encounter (Signed)
Spoke with Dr. Epimenio FootSater the work in MD. Dr. Roda ShuttersXu is off on today.  Rn explain pt is in office having EEG,and blood work done. Also she express a terrible headache. PT was prescribed topamax, and given a occipital nerve block on 01/08/2017. He advise to prescribed her steroids.

## 2017-01-09 NOTE — Telephone Encounter (Signed)
Rn went to waiting room to advise her that the prednisone was sent to her pharmacy for the headache. Pt was still [facility] Pt stated she had prednisone in the past and it did not help. Pt stated the only thing her bad headache was a pain shot. Rn advised pt that to continue her topamax and start the prednisone per Dr. Epimenio FootSater. PT was advised that if the headache is no better to seek the nearest ED. Pt verbalized understanding.Pt was also advised to get her lab work done after her EEG.

## 2017-01-09 NOTE — Procedures (Signed)
    History:  Stephanie [name] is a 51 year old patient with a history of hepatitis C, congestive heart failure, and drug abuse. The patient has had intermittent episodes of confusion and amnestic events. She is being evaluated for this.  This is a routine EEG. No skull defects are noted. Medications include Xanax, amlodipine, Lipitor, Fioricet, vitamin D, Cymbalta, glipizide, hyoscyamine, Invokana, Keppra, Claritin, Cozaar, Lopressor, Prilosec, prednisone, Topamax, and Ambien.   EEG classification: Normal awake  Description of the recording: The background rhythms of this recording consists of a fairly well modulated medium amplitude alpha rhythm of 9 Hz that is reactive to eye opening and closure. As the record progresses, the patient appears to remain in the waking state throughout the recording. Photic stimulation was performed, resulting in a bilateral and symmetric photic driving response. Hyperventilation was also performed, resulting in a minimal buildup of the background rhythm activities without significant slowing seen. At no time during the recording does there appear to be evidence of spike or spike wave discharges or evidence of focal slowing. EKG monitor shows no evidence of cardiac rhythm abnormalities with a heart rate of 66.  Impression: This is a normal EEG recording in the waking state. No evidence of ictal or interictal discharges are seen.

## 2017-01-10 ENCOUNTER — Telehealth: Payer: Self-pay | Admitting: *Deleted

## 2017-01-10 LAB — C-REACTIVE PROTEIN: CRP: 1.3 mg/L (ref 0.0–4.9)

## 2017-01-10 LAB — SEDIMENTATION RATE: SED RATE: 7 mm/h (ref 0–40)

## 2017-01-10 NOTE — Telephone Encounter (Signed)
Per Dr Roda ShuttersXu, spoke with patient and informed her that her EEG and labs were normal. Advised she continue with his current treatment plan. Reviewed the plan per Dr Warren DanesXu's last office note. She stated she was feeling some better as far as her headaches. Advised she needs to give increased dose of topamax 2-4 weeks to be totally effective ans to stay well hydrated. She stated she drinks water and decaff unsweet tea only. Advised she call back for any needs, concerns prior to her follow up. She verbalized understanding, appreciation.

## 2017-01-18 ENCOUNTER — Emergency Department: Payer: No Typology Code available for payment source

## 2017-01-18 ENCOUNTER — Encounter: Payer: Self-pay | Admitting: Emergency Medicine

## 2017-01-18 ENCOUNTER — Emergency Department
Admission: EM | Admit: 2017-01-18 | Discharge: 2017-01-18 | Disposition: A | Discharge status: Home / Self Care | Payer: No Typology Code available for payment source | Attending: Emergency Medicine | Admitting: Emergency Medicine

## 2017-01-18 DIAGNOSIS — W010XXA Fall on same level from slipping, tripping and stumbling without subsequent striking against object, initial encounter: Secondary | ICD-10-CM | POA: Diagnosis not present

## 2017-01-18 DIAGNOSIS — S060X0A Concussion without loss of consciousness, initial encounter: Secondary | ICD-10-CM | POA: Diagnosis not present

## 2017-01-18 DIAGNOSIS — Y999 Unspecified external cause status: Secondary | ICD-10-CM | POA: Insufficient documentation

## 2017-01-18 DIAGNOSIS — S42201A Unspecified fracture of upper end of right humerus, initial encounter for closed fracture: Secondary | ICD-10-CM

## 2017-01-18 DIAGNOSIS — S8011XA Contusion of right lower leg, initial encounter: Secondary | ICD-10-CM | POA: Diagnosis not present

## 2017-01-18 DIAGNOSIS — S0990XA Unspecified injury of head, initial encounter: Secondary | ICD-10-CM | POA: Diagnosis present

## 2017-01-18 DIAGNOSIS — S0083XA Contusion of other part of head, initial encounter: Secondary | ICD-10-CM | POA: Diagnosis not present

## 2017-01-18 DIAGNOSIS — Y939 Activity, unspecified: Secondary | ICD-10-CM | POA: Insufficient documentation

## 2017-01-18 DIAGNOSIS — Y929 Unspecified place or not applicable: Secondary | ICD-10-CM | POA: Insufficient documentation

## 2017-01-18 MED ORDER — MORPHINE SULFATE (PF) 4 MG/ML IV SOLN
4.0000 mg | Freq: Once | INTRAVENOUS | Status: AC
  Administered 2017-01-18: 4 mg via INTRAMUSCULAR
  Filled 2017-01-18: qty 1

## 2017-01-18 MED ORDER — ONDANSETRON 4 MG PO TBDP
4.0000 mg | ORAL_TABLET | Freq: Once | ORAL | Status: AC
  Administered 2017-01-18: 4 mg via ORAL
  Filled 2017-01-18: qty 1

## 2017-01-18 MED ORDER — OXYCODONE HCL 5 MG PO TABS: ORAL_TABLET | ORAL | 0 refills | Status: DC

## 2017-01-18 MED ORDER — OXYCODONE-ACETAMINOPHEN 5-325 MG PO TABS
1.0000 | ORAL_TABLET | Freq: Once | ORAL | Status: AC
Start: 2017-01-18 — End: 2017-01-18
  Administered 2017-01-18: 1 via ORAL
  Filled 2017-01-18: qty 1

## 2017-01-18 NOTE — ED Triage Notes (Signed)
States tripped over dog this am 6 am, states did have loss of consciousness. Bruise noted R shoulder.

## 2017-01-18 NOTE — Discharge Instructions (Signed)
Leave sling in place while fractures healing. Please follow up with orthopedic doctor, call the office next week.  Return to emergency room immediately for any worsening pain, weakness, numbness, confusion or altered mental status, or any other symptoms concerning to you.

## 2017-01-18 NOTE — ED Provider Notes (Signed)
[facility] Emergency Department Provider Note ____________________________________________   I have reviewed the triage vital signs and the triage nursing note.  HISTORY  Chief Complaint Fall   Historian Patient  HPI Stephanie [name] is a 51 y.o. female fell after tripping over small dogs around 5:30am down one step outside.  She struck the right eyebrow, does not think she lost consciousness.  She bruised her right shin.    She has severe pain at the right upper arm where there is a developing bruise.  She reports feeling confused between 5:30 and 6:30 AM, although she was talking back and forth with her husband, she doesn't know how an hour went by.  No neck pain. No abdominal pain. No hip pain.  Will be hard makes it feel worse.    Past Medical History:  Diagnosis Date  . Diabetes mellitus without complication (HCC)   . Diverticulitis   . Headache   . Hepatitis C   . Hypertension   . IBS (irritable bowel syndrome)   . Kidney stones   . Sciatica   . Stroke [facility])     Patient Active Problem List   Diagnosis Date Noted  . Headache 01/07/2017  . Small bowel obstruction (HCC) 09/02/2016  . Pain and swelling of left upper extremity 07/22/2016  . Superficial venous thrombosis of arm, left 07/22/2016  . ICH (intracerebral hemorrhage) (HCC)   . Essential hypertension, malignant 04/19/2016  . Cytotoxic brain edema (HCC) 04/19/2016  . IVH (intraventricular hemorrhage) (HCC) 04/18/2016  . Hepatitis C     Past Surgical History:  Procedure Laterality Date  . ABDOMINAL HYSTERECTOMY    . carpel tunnel syndrome  2017  . KIDNEY STONE SURGERY Right 02/12/12  . KIDNEY STONE SURGERY Left 07/15/2012  . LIGATION OF ARTERIOVENOUS  FISTULA Left 06/21/2016   Procedure: LIGATION OF ARTERIOVENOUS  FISTULA ( LIGATION BASILIC VEIN );  Surgeon: Renford Dills, MD;  Location: [facility] ORS;  Service: Vascular;  Laterality: Left;  . LITHOTRIPSY    . OOPHORECTOMY     . SINUS EXPLORATION      Prior to Admission medications   Medication Sig Start Date End Date Taking? Authorizing Provider  alprazolam Prudy Feeler) 2 MG tablet Take 2 mg by mouth 2 (two) times daily.     [provider]  amLODipine (NORVASC) 10 MG tablet Take 10 mg by mouth daily.    [provider]  atorvastatin (LIPITOR) 40 MG tablet Take 40 mg by mouth every evening.    [provider]  butalbital-acetaminophen-caffeine (FIORICET, ESGIC) 50-325-40 MG tablet Take 1 tablet by mouth 2 (two) times daily as needed for headache.    [provider]  cholecalciferol (VITAMIN D) 1000 units tablet Take 2,000 Units by mouth daily.    [provider]  clopidogrel (PLAVIX) 75 MG tablet Take 1 tablet (75 mg total) by mouth daily. 06/27/16   Marvel Plan, MD  Diclofenac Sodium 1.5 % SOLN as needed.  11/29/16   [provider]  diphenoxylate-atropine (LOMOTIL) 2.5-0.025 MG tablet Take 1 tablet by mouth 4 (four) times daily as needed for diarrhea or loose stools.    [provider]  Doxepin HCl 5 % CREA as needed.  11/29/16   [provider]  DULoxetine (CYMBALTA) 60 MG capsule Take 60 mg by mouth daily.    [provider]  Fluocinonide 0.1 % CREA as needed.  11/29/16   [provider]  glipiZIDE (GLUCOTROL) 5 MG tablet Take 1 tablet (  5 mg total) by mouth 2 (two) times daily before a meal. Patient taking differently: Take 5 mg by mouth daily before breakfast.  04/30/16   Lora PaulaKrall, Jennifer T, MD  hyoscyamine (LEVSIN SL) 0.125 MG SL tablet Place 0.125 mg under the tongue every 4 (four) hours as needed.    [provider]  INVOKANA 300 MG TABS tablet Take 300 mg by mouth daily before breakfast.     [provider]  levETIRAcetam (KEPPRA) 500 MG tablet Take 1 tablet (500 mg total) by mouth 2 (two) times daily. 04/30/16   Lora PaulaKrall, Jennifer T, MD  LIVIXIL PAK cream  01/06/17   [provider]  loratadine  (CLARITIN) 10 MG tablet Take 10 mg by mouth daily.    [provider]  losartan (COZAAR) 50 MG tablet Take 50 mg by mouth 2 (two) times daily.    [provider]  metoprolol (LOPRESSOR) 50 MG tablet Take 50 mg by mouth 2 (two) times daily.     [provider]  omeprazole (PRILOSEC) 40 MG capsule Take 40 mg by mouth daily.    [provider]  oxyCODONE (ROXICODONE) 5 MG immediate release tablet 5-10mg  every 6 hours as needed for severe pain 01/18/17   Governor RooksLord, Calyb Mcquarrie, MD  predniSONE (DELTASONE) 10 MG tablet 6 po x 1day then 5 po x 1day, then 4 po x 1d, then 3 po x 1d, then 2 po x 1d, then 1 po x1d 01/09/17   Sater, Pearletha Furlichard A, MD  topiramate (TOPAMAX) 25 MG tablet 25mg  bid for 7 days and then 50mg  bid 01/07/17   Marvel PlanXu, Jindong, MD  zolpidem (AMBIEN) 10 MG tablet Take 10 mg by mouth at bedtime as needed for sleep.     [provider]    Allergies  Allergen Reactions  . Aspirin Itching  . Buprenorphine Hcl Itching  . Codeine Other (See Comments)    Makes her feel like something is crawling under her skin. Can take, sometimes makes her itch  . Compazine Itching    "makes my skin crawl"  . Ioxaglate Itching  . Ivp Dye [Iodinated Diagnostic Agents] Itching    Makes her itch really bad. Had to get 2-3 shots of Benadryl when she was in the hospital.  . Metrizamide Itching  . Naproxen Itching  . Norco [Hydrocodone-Acetaminophen] Itching  . Penicillin G Rash    Other reaction(s): Skin Rashes, Hives  . Penicillins Itching and Other (See Comments)    Makes her feel really uncomfortable. No other information available.  . Prochlorperazine Maleate Other (See Comments)    Compazine makes her skin crawl - needs 2-3 shots of Benadryl to get relief.  . Reglan [Metoclopramide] Itching and Other (See Comments)    Makes her skin crawl  . Sulfur Rash    Other reaction(s): Skin Rashes, Hives  . Tegretol [Carbamazepine] Itching and Other (See Comments)    Makes her feel  like something is crawling under her skin.  . Toradol [Ketorolac Tromethamine] Itching  . Tramadol Itching    Family History  Problem Relation Age of Onset  . Heart failure Mother   . Diabetes Father   . Cancer Sister     Social History Social History  Substance Use Topics  . Smoking status: Current Every Day Smoker    Packs/day: 0.25    Years: 1.50    Types: Cigarettes  . Smokeless tobacco: Never Used  . Alcohol use No    Review of Systems  Constitutional: Negative  for fever. Eyes: Negative for visual changes. ENT: Negative for sore throat. Cardiovascular: Negative for chest pain. Respiratory: Negative for shortness of breath. Gastrointestinal: Negative for abdominal pain, vomiting and diarrhea. Genitourinary: Negative for dysuria. Musculoskeletal: Negative for back pain. Skin: Negative for rash. Neurological: Negative for headache. 10 point Review of Systems otherwise negative ____________________________________________   PHYSICAL EXAM:  VITAL SIGNS: ED Triage Vitals  Enc Vitals Group     BP 01/18/17 0903 (!) 156/97     Pulse Rate 01/18/17 0903 77     Resp 01/18/17 0903 (!) 22     Temp 01/18/17 0903 97.9 F (36.6 C)     Temp Source 01/18/17 0903 Oral     SpO2 01/18/17 0903 100 %     Weight 01/18/17 0905 180 lb (81.6 kg)     Height 01/18/17 0905 5\' 2"  (1.575 m)     Head Circumference --      Peak Flow --      Pain Score 01/18/17 0909 10     Pain Loc --      Pain Edu? --      Excl. in GC? --      Constitutional: Alert and oriented. Well appearing and in no distress. HEENT   Head: Normocephalic. Bruising to the right eyebrow.      Eyes: Conjunctivae are normal. PERRL. Normal extraocular movements.      Ears:         Nose: No congestion/rhinnorhea.   Mouth/Throat: Mucous membranes are moist.   Neck: No stridor.  No cervical spine tenderness to palpation or range of motion. Cardiovascular/Chest: Normal rate, regular rhythm.  No murmurs,  rubs, or gallops. Respiratory: Normal respiratory effort without tachypnea nor retractions. Breath sounds are clear and equal bilaterally. No wheezes/rales/rhonchi. Gastrointestinal: Soft. No distention, no guarding, no rebound. Nontender.   Genitourinary/rectal:Deferred Musculoskeletal: Bruising to the right anterior shoulder margin. Pain and swelling at the right upper arm. Some bruising to the right hand with no bony point tenderness and full range of motion at the wrist and the hand joints. Bruising over the right shin with moderate swelling, no evidence for compartment syndrome. Neurovascularly intact. No bony tenderness of the lower extremities bilaterally. Right upper extremity neurologically and vascularly intact. Neurologic:  Normal speech and language. No gross or focal neurologic deficits are appreciated. Skin:  Skin is warm, dry and intact. Bruising as per above Psychiatric: Mood and affect are normal. Speech and behavior are normal. Patient exhibits appropriate insight and judgment.   ____________________________________________  LABS (pertinent positives/negatives)  Labs Reviewed - No data to display  ____________________________________________    EKG I, Governor Rooks, MD, the attending physician have personally viewed and interpreted all ECGs.  None ____________________________________________  RADIOLOGY All Xrays were viewed by me. Imaging interpreted by Radiologist.  CT head without contrast:  IMPRESSION: Stable region of encephalomalacia in the posterior left temporal lobe at the level of prior old parenchymal hemorrhage. No acute findings.  Shoulder right x-ray:  IMPRESSION: Comminuted, angulated and displaced fracture involving the surgical neck of the right humerus.  Chest x-ray 1 view:  IMPRESSION: No active disease.  Right proximal humeral neck fracture.    __________________________________________  PROCEDURES  Procedure(s) performed:  None  Critical Care performed: None  ____________________________________________   ED COURSE / ASSESSMENT AND PLAN  Pertinent labs & imaging results that were available during my care of the patient were reviewed by me and considered in my medical decision making (see chart for details).  Ms. Dayrit  is here after fall this morning. She is mostly complaining of her right arm hurting. X-ray confirms a proximal humeral neck fracture. She is neurovascularly intact. Head CT was obtained given her facial injury and she is on Plavix and she reports an hour where things were a little fuzzy. She is currently clear thinking. I think that she is not having a distracting injury in terms of evaluating her cervical spine although she is reporting tenderness and pain at her right shoulder when she moves it, she is able to be up, comfortable and focused on other areas and she has no neck pain at all palpation or range of motion.  I don't have a suspicion of other bony fractures although she does have bruising over her right shin and right hand she is not really having bony tenderness. I think this is bruising more as a result of being on Plavix than underlying bony fracture.  Her ride did come and the patient was ready to go home although she was still reporting 10 pain at her right shoulder with movement after the IM morphine, she would prefer to take a Percocet tablet here and then go on home now rather than stay and get more pain control here in the emergency department.  Her family member did come to pick her up. I reviewed her controlled substance prescription history, she is on several other medications which didn't not involve any chronic pain medication, but she was warned about sedation side effects. She was given 12 tablets total.  CONSULTATIONS:  None   Patient / Family / Caregiver informed of clinical course, medical decision-making process, and agree with plan.   I discussed return  precautions, follow-up instructions, and discharge instructions with patient and/or family.   ___________________________________________   FINAL CLINICAL IMPRESSION(S) / ED DIAGNOSES   Final diagnoses:  Closed fracture of proximal end of right humerus, unspecified fracture morphology, initial encounter  Contusion of right leg, initial encounter  Concussion without loss of consciousness, initial encounter  Contusion of face, initial encounter              Note: This dictation was prepared with Dragon dictation. Any transcriptional errors that result from this process are unintentional    Governor Rooks, MD 01/18/17 1134

## 2017-01-20 ENCOUNTER — Telehealth: Payer: Self-pay | Admitting: Neurology

## 2017-01-20 DIAGNOSIS — S42309A Unspecified fracture of shaft of humerus, unspecified arm, initial encounter for closed fracture: Secondary | ICD-10-CM

## 2017-01-20 HISTORY — DX: Unspecified fracture of shaft of humerus, unspecified arm, initial encounter for closed fracture: S42.309A

## 2017-01-21 ENCOUNTER — Telehealth: Payer: Self-pay | Admitting: Neurology

## 2017-01-21 ENCOUNTER — Ambulatory Visit: Payer: 59 | Admitting: Neurology

## 2017-01-21 DIAGNOSIS — Z0289 Encounter for other administrative examinations: Secondary | ICD-10-CM

## 2017-01-21 NOTE — Telephone Encounter (Signed)
Pt called to r/s appt today, per Dr Roda ShuttersXu ok to r/s with another provider. He said brain bleed was last yr. appt has been scheduled for 01/23/17

## 2017-01-22 ENCOUNTER — Encounter: Payer: Self-pay | Admitting: Neurology

## 2017-01-23 ENCOUNTER — Telehealth: Payer: Self-pay | Admitting: Neurology

## 2017-01-23 ENCOUNTER — Ambulatory Visit: Payer: 59 | Admitting: Neurology

## 2017-01-23 DIAGNOSIS — R51 Headache: Principal | ICD-10-CM

## 2017-01-23 DIAGNOSIS — R519 Headache, unspecified: Secondary | ICD-10-CM
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
## 2017-01-23 NOTE — Telephone Encounter (Signed)
Patient was no show for appt today.

## 2017-01-23 NOTE — Addendum Note (Signed)
Addended by: Marvel PlanXU, Tremain Rucinski on: 01/23/2017 11:53 AM   Modules accepted: Orders

## 2017-01-23 NOTE — Telephone Encounter (Signed)
Patient was a no show for the following appts: 10/01/2016 Dr.Xu MD 11/14/2016 Eber Jonesarolyn (NP) 01/23/2017 Dr. Anne HahnWIllis MD  Rn call Dr Roda ShuttersXu via phone about the pt having 3 no show this year. Rn ask if he wanted to dismiss patient from clinic. Pt was seen 2 weeks for app. PT was schedule with Dr. Anne HahnWIllis for a fall and possible concussion. He was seeing the patient for Dr. Roda ShuttersXu because he is out of the office. PTs son requested a referral to Neurologist in [place]. Dr. Roda ShuttersXu he will refer pt to neurologist [facility] because of the closer location.

## 2017-01-23 NOTE — Telephone Encounter (Signed)
Patients son Thayer OhmChris called office in reference to patients appointment this morning with Dr. Anne HahnWillis stating she had another appointment at 10 with Dr. About her shoulder.  I advised son this will be considered a NO show for today (3 no show's 2018) and there is a $50 NO show Fee being our office policy.  He then asked if patient can be referred to a neurologist in [place] because they live in [place].

## 2017-01-23 NOTE — Telephone Encounter (Signed)
The patient did not show for a revisit appointment today. The patient had another scheduled appointment with another doctor.

## 2017-01-24 NOTE — Telephone Encounter (Signed)
Hi Dr. Roda ShuttersXu, Happy to see her.  Just FYI, I don't check [facility] regularly as we use [facility].

## 2017-01-26 NOTE — Telephone Encounter (Signed)
Thanks, Dr. Sherryll BurgerShah, for your kind help.  - Caera Enwright

## 2017-01-28 DIAGNOSIS — Z8669 Personal history of other diseases of the nervous system and sense organs: Secondary | ICD-10-CM | POA: Insufficient documentation

## 2017-01-28 DIAGNOSIS — Z8673 Personal history of transient ischemic attack (TIA), and cerebral infarction without residual deficits: Secondary | ICD-10-CM | POA: Insufficient documentation

## 2017-02-07 ENCOUNTER — Telehealth: Payer: Self-pay | Admitting: Neurology
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
## 2017-02-18 IMAGING — US US ABDOMEN LIMITED
1 series · 14 of 25 positions shown · non-contrast
Comparison: Prior ultrasound on 03/11/2016 and unenhanced CT of the
abdomen on 07/29/2016.

ADDENDUM:
At least a single shadowing, nonobstructing calculus is identified
in the right kidney. This was noted on prior CT.
CLINICAL DATA: Right upper quadrant abdominal pain, nausea and
vomiting.

EXAM:
US ABDOMEN LIMITED - RIGHT UPPER QUADRANT

[Series 1: us abdomen limited · 0.22mm/px · 14 of 50 slices shown]
[im 1/50]
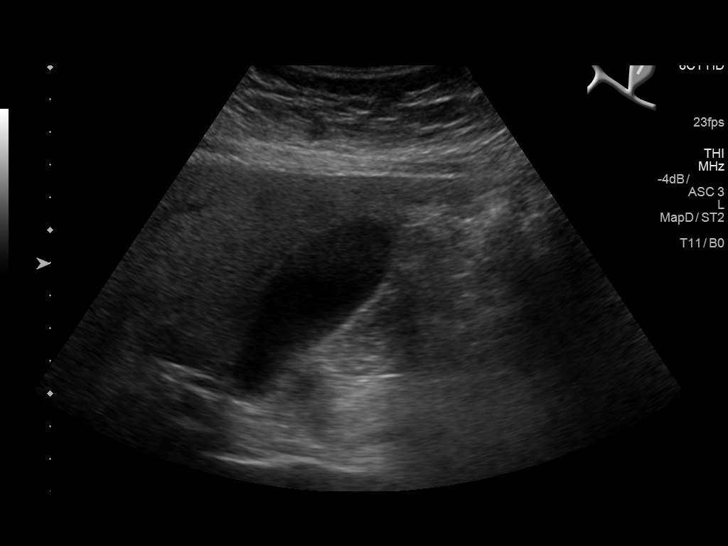
[im 5/50]
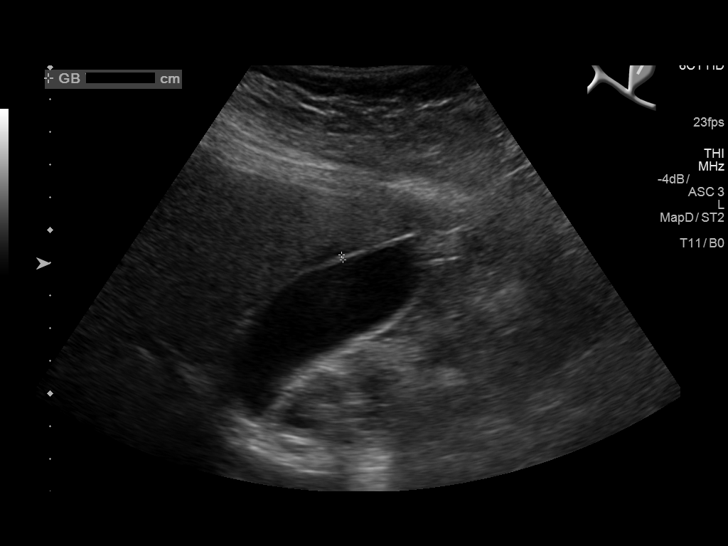
[im 9/50]
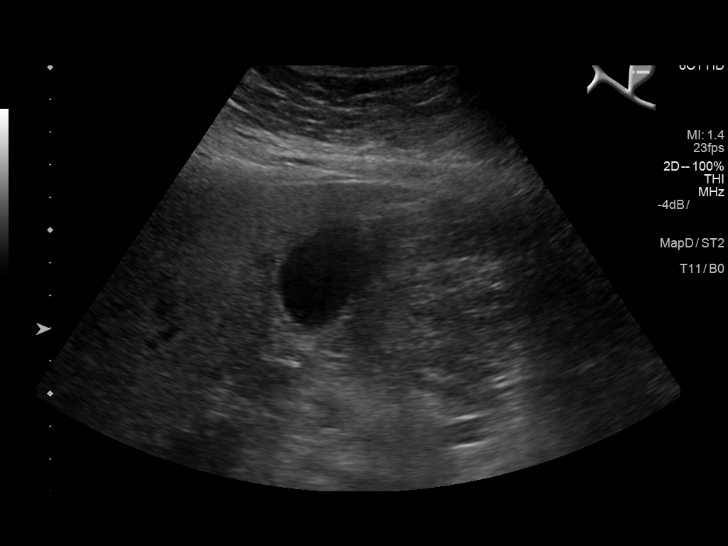
[im 13/50]
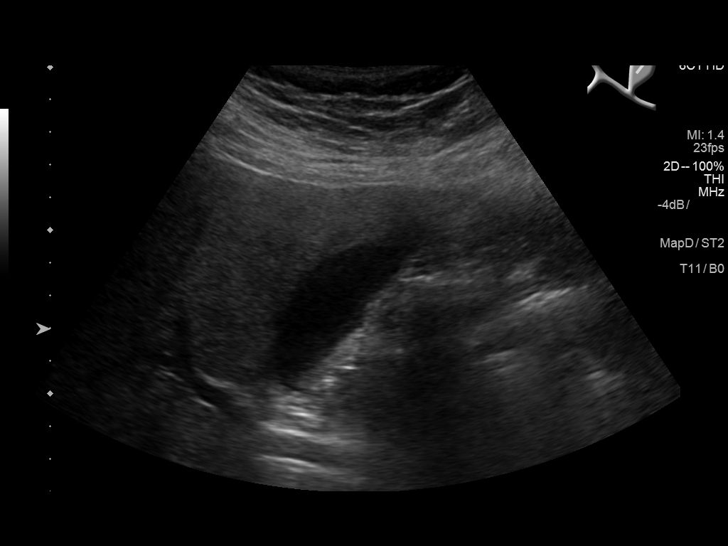
[im 17/50]
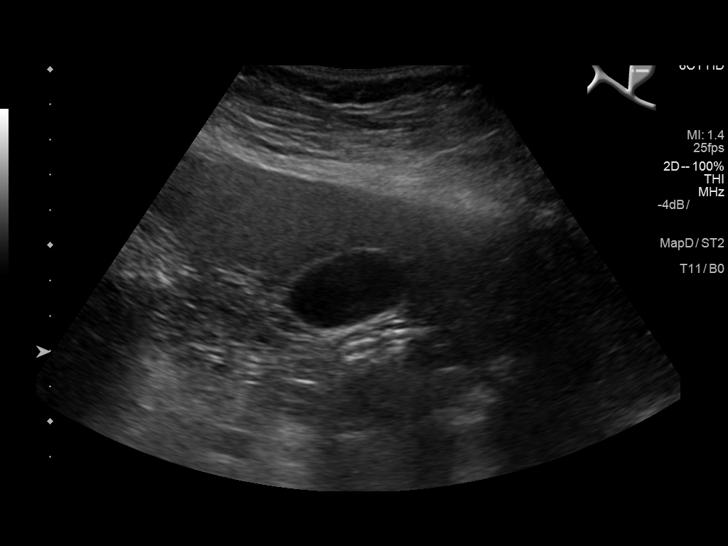
[im 19/50]
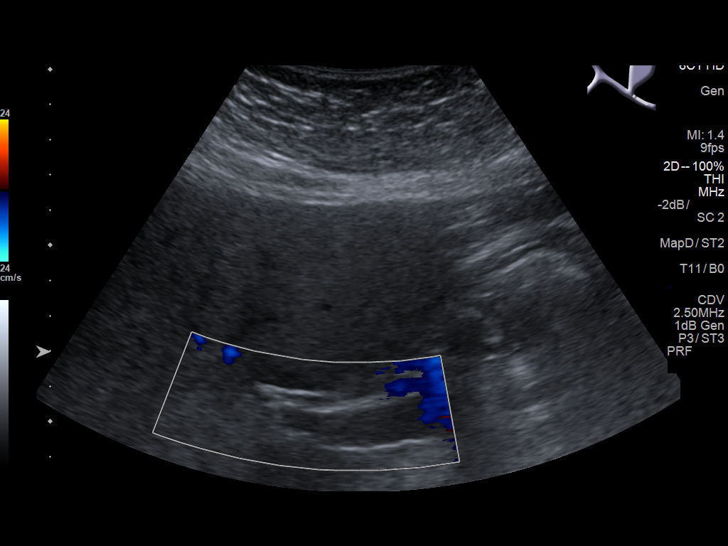
[im 23/50]
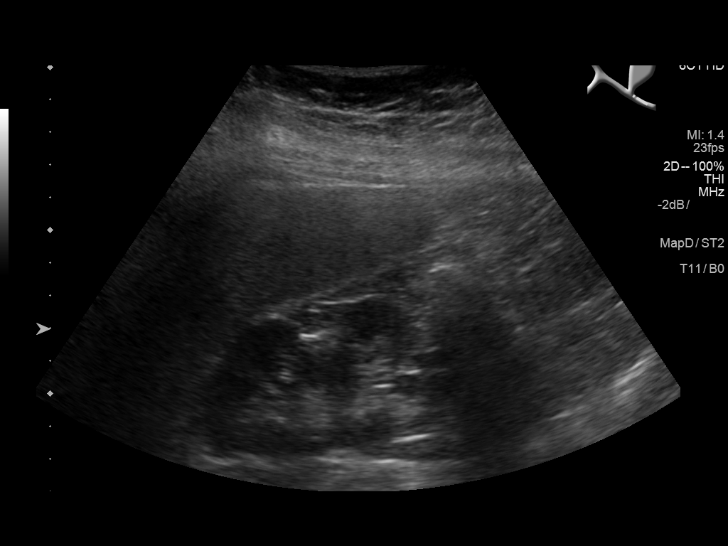
[im 27/50]
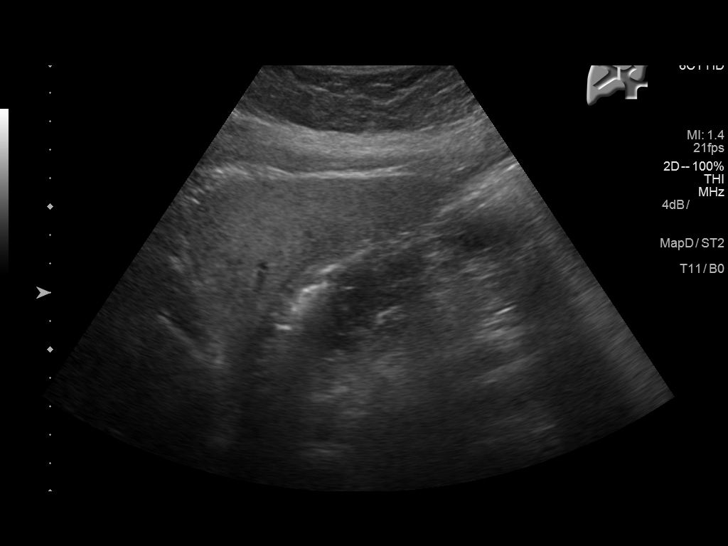
[im 31/50]
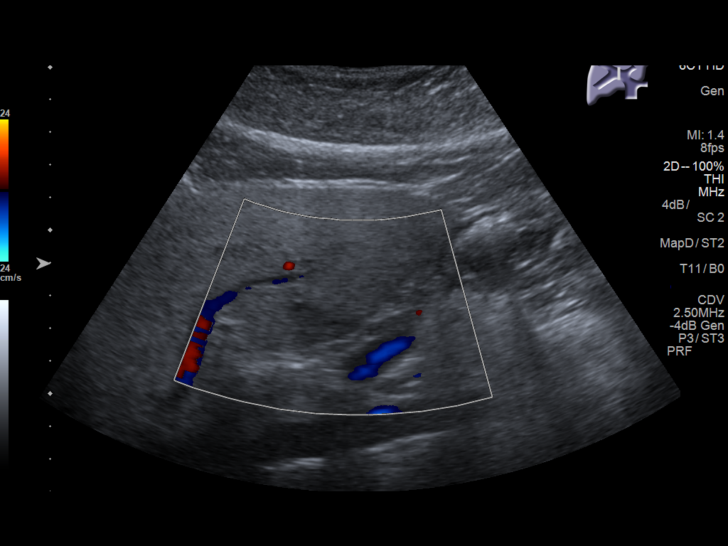
[im 33/50]
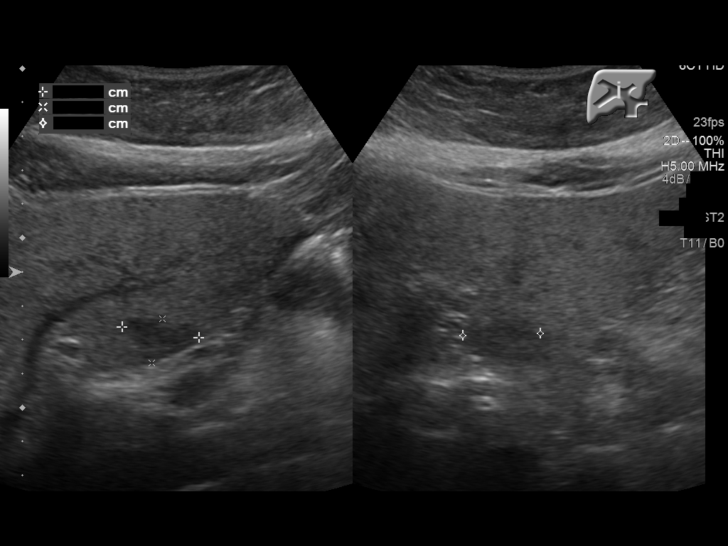
[im 37/50]
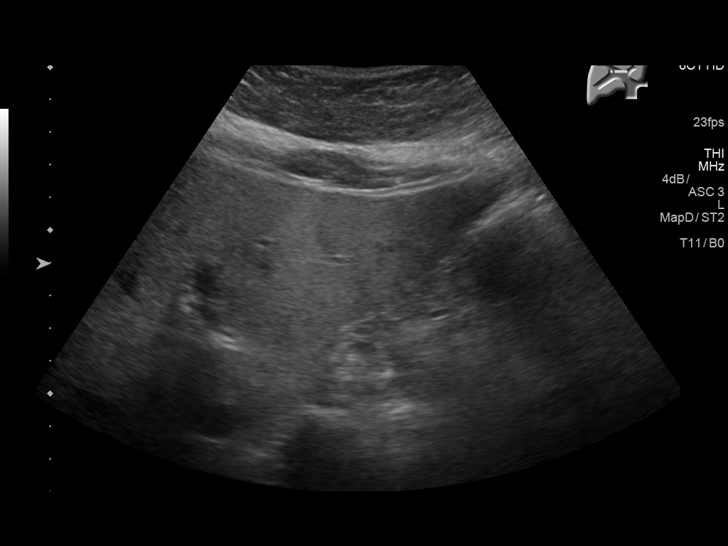
[im 41/50]
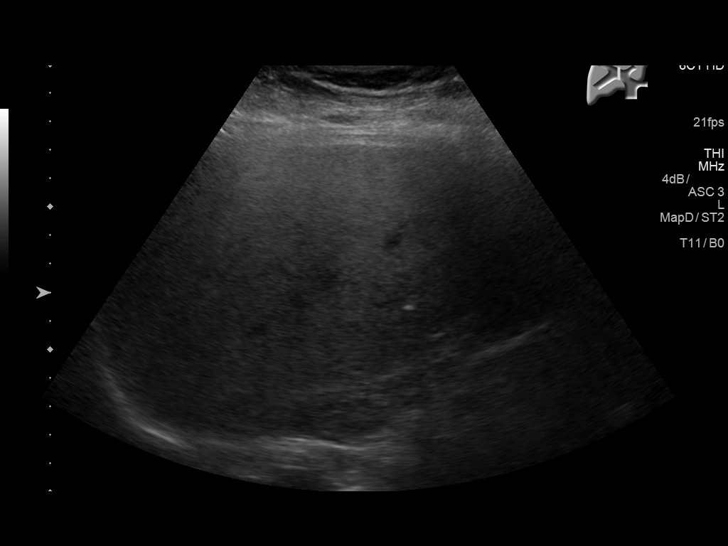
[im 45/50]
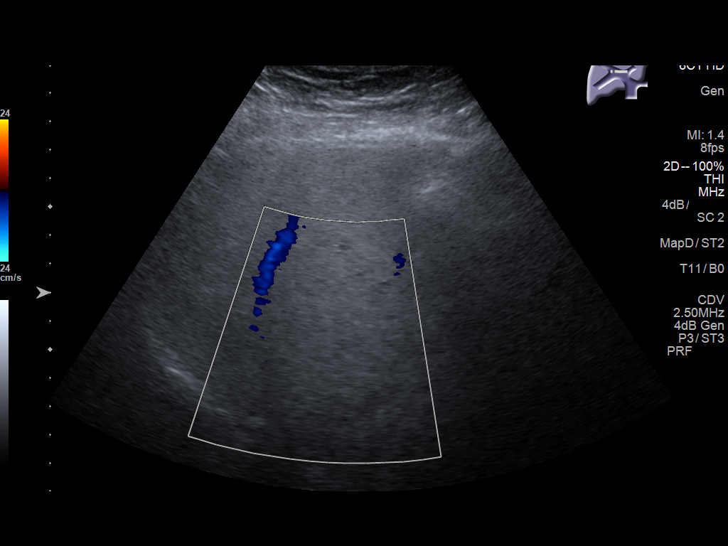
[im 50/50]
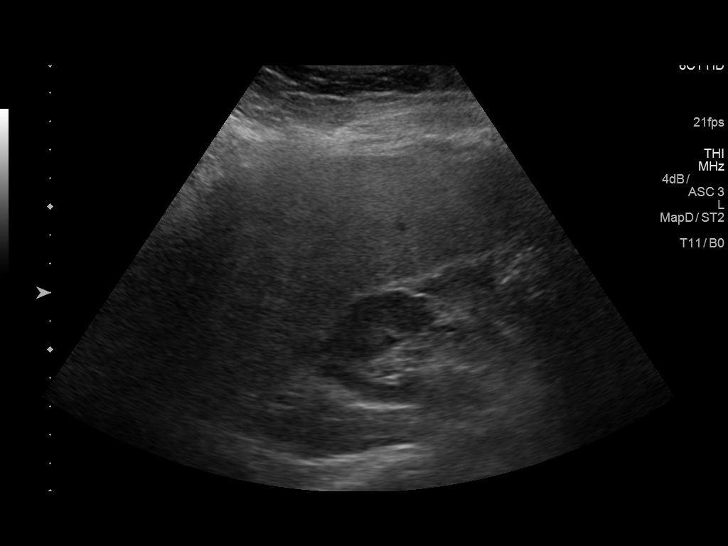

[14 of 25 positions shown; findings below may reference images not displayed]

FINDINGS: Gallbladder:

No gallstones or wall thickening visualized. No sonographic Murphy
sign noted by sonographer.

Common bile duct:

Diameter: 3 mm.

Liver:

The liver demonstrates coarse echotexture and increased
echogenicity, likely reflecting diffuse steatosis. No overt
cirrhotic contour abnormalities or focal lesions are identified.
Focal triangular areas of relative lower echogenicity in the liver
parenchyma near the gallbladder fossa and in the left lobe likely
are representative of areas of relative fatty sparing. There is no
evidence of intrahepatic biliary ductal dilatation. The portal vein
is open.
IMPRESSION: Hepatic steatosis.

## 2017-02-27 ENCOUNTER — Other Ambulatory Visit: Payer: Self-pay | Admitting: Neurology

## 2017-03-01 IMAGING — CT CT ABD-PELV W/O CM
2 of 4 series · 15 of 46 positions shown, 17 images · non-contrast
Comparison: 12/26/2014

CLINICAL DATA: Right lower quadrant abdominal pain and nausea for 3
days.

EXAM:
CT ABDOMEN AND PELVIS WITHOUT CONTRAST
TECHNIQUE: Multidetector CT imaging of the abdomen and pelvis was performed
following the standard protocol without IV contrast.

[Series 2: abd/ pelvis 5.0 i30f 1 · axial · 0.76mm/px · z∈[-480,-94]mm · 12 of 88 slices shown, 14 images]
[im 7/88  soft-tissue]
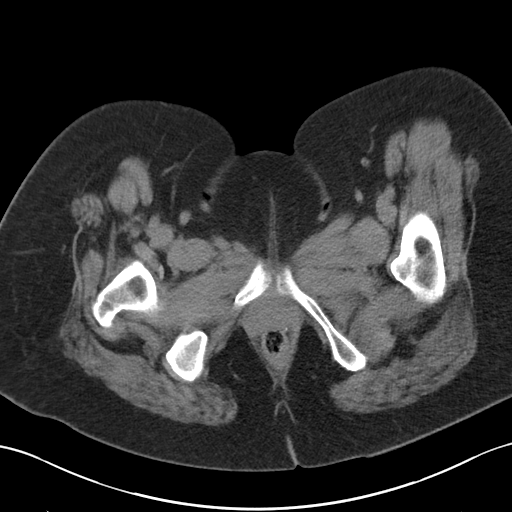
[im 7/88  bone]
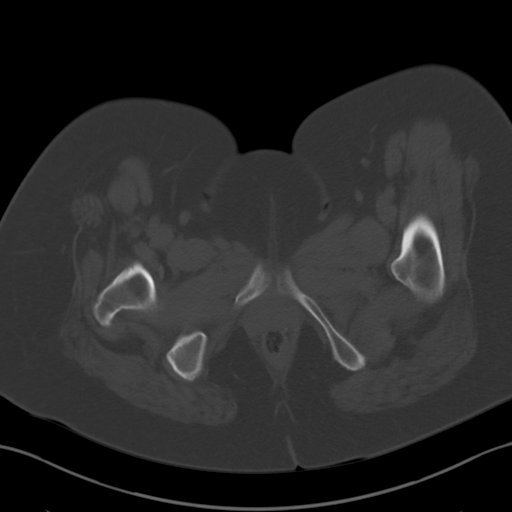
[im 14/88  soft-tissue]
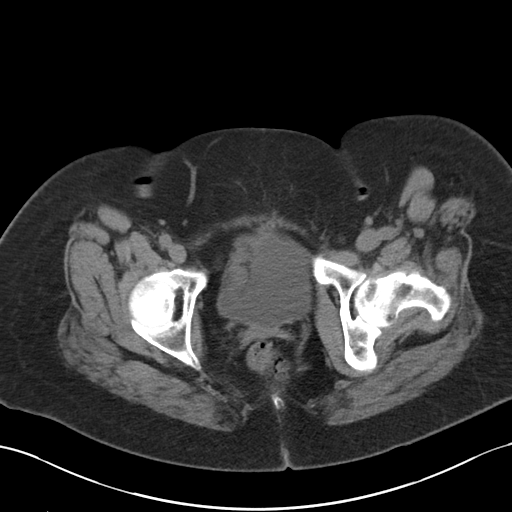
[im 21/88  soft-tissue]
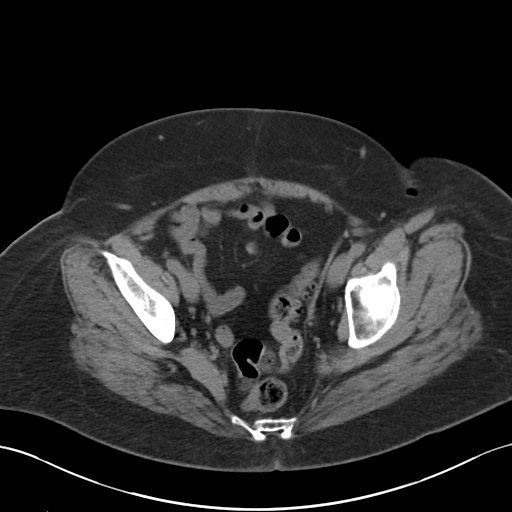
[im 28/88  soft-tissue]
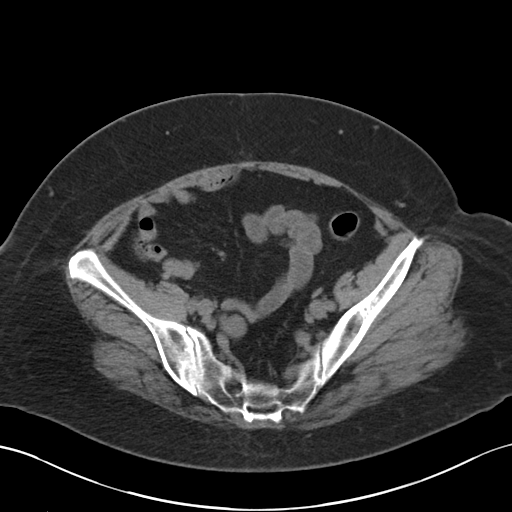
[im 35/88  soft-tissue]
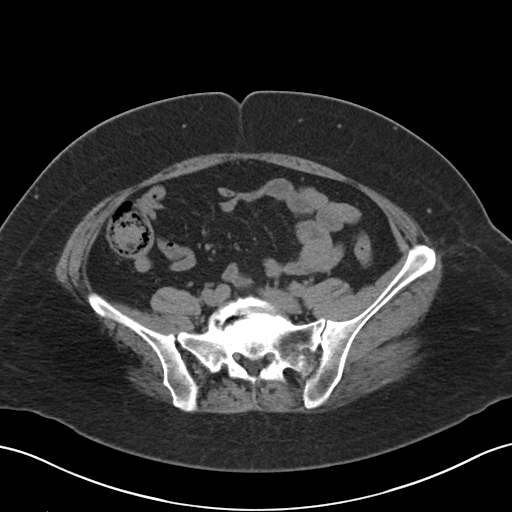
[im 42/88  soft-tissue]
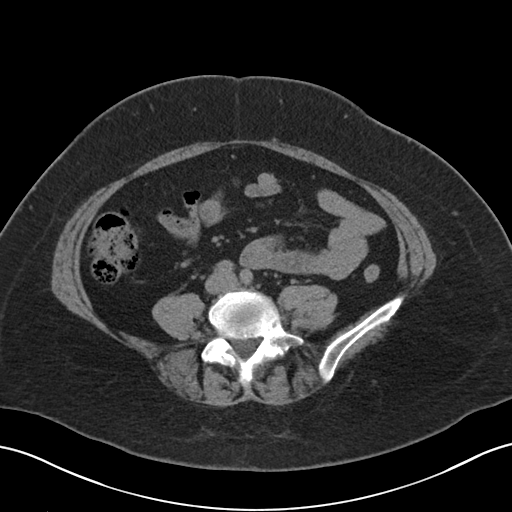
[im 49/88  soft-tissue]
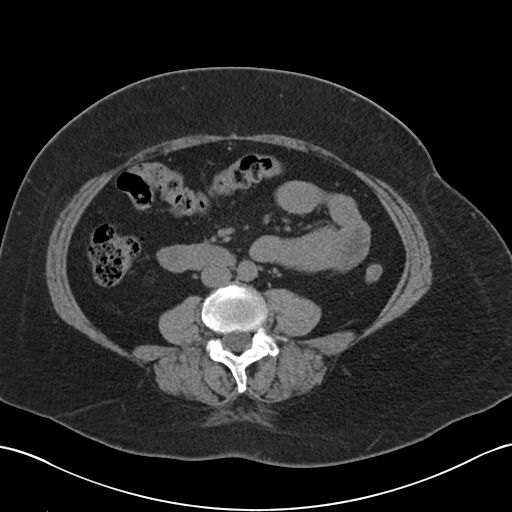
[im 56/88  soft-tissue]
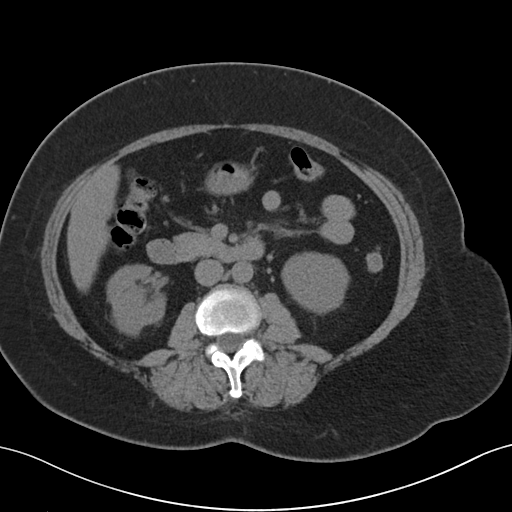
[im 63/88  soft-tissue]
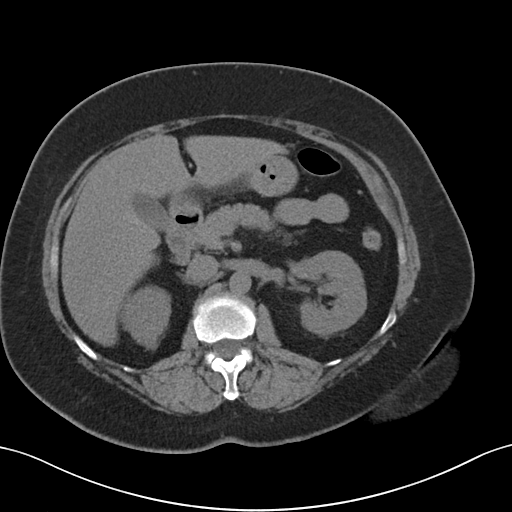
[im 63/88  bone]
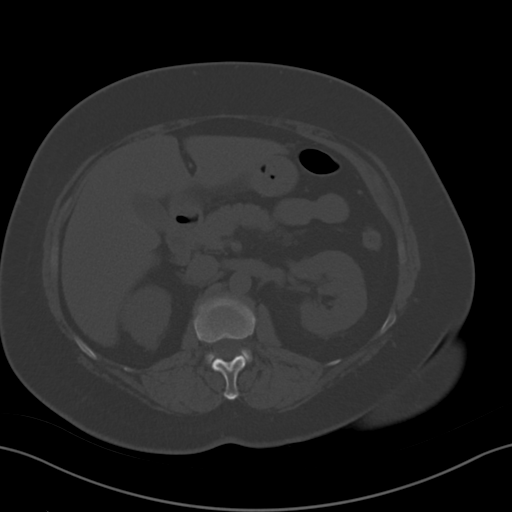
[im 70/88  soft-tissue]
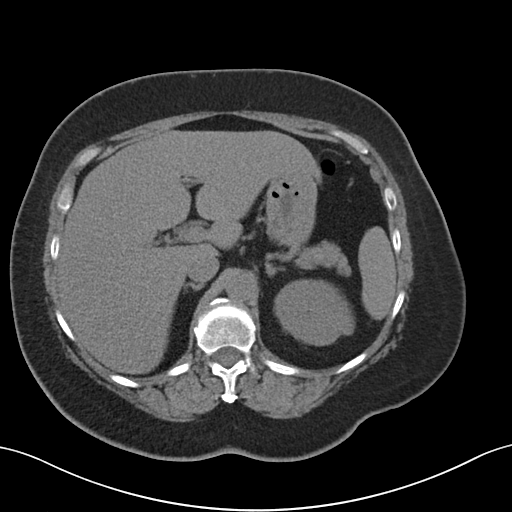
[im 77/88  soft-tissue]
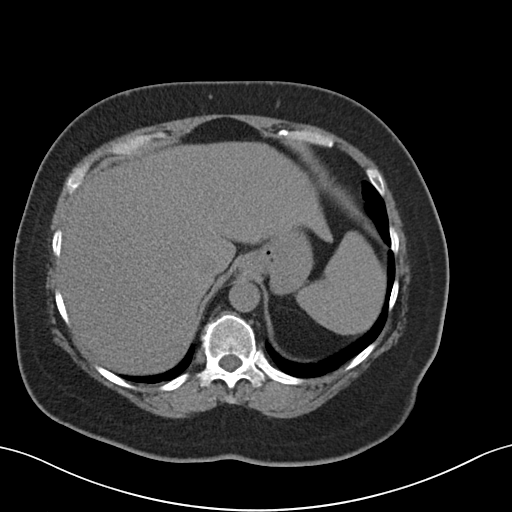
[im 84/88  soft-tissue]
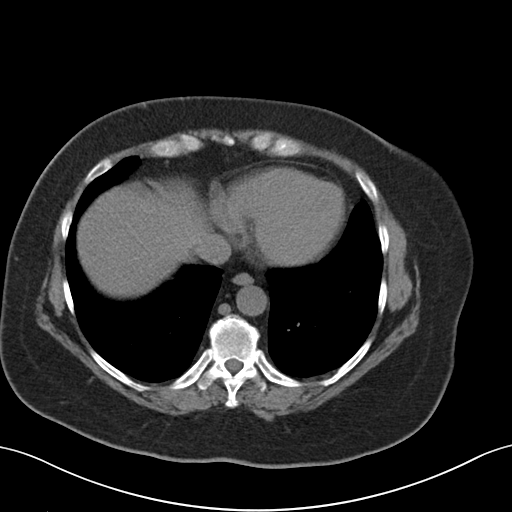

[Series 5: coronals · coronal · 0.70mm/px · 3 of 121 slices shown]
[im 41/121  soft-tissue]
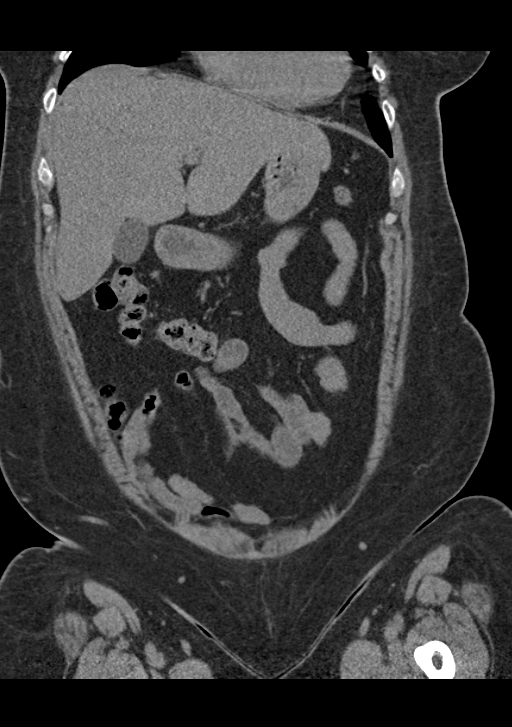
[im 54/121  soft-tissue]
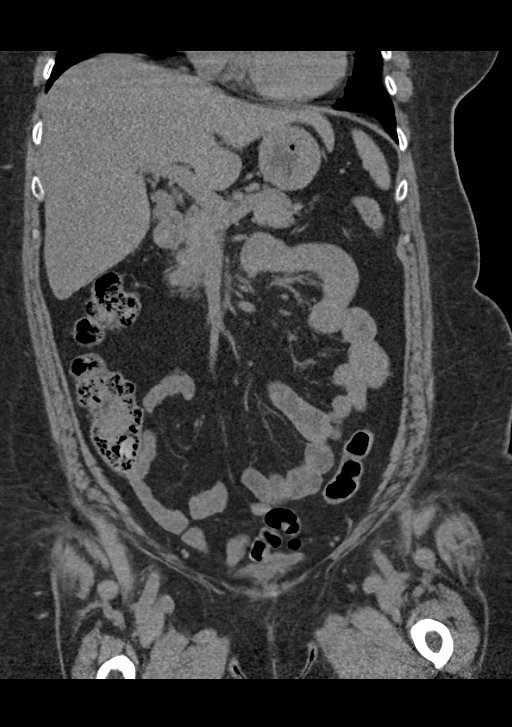
[im 67/121  soft-tissue]
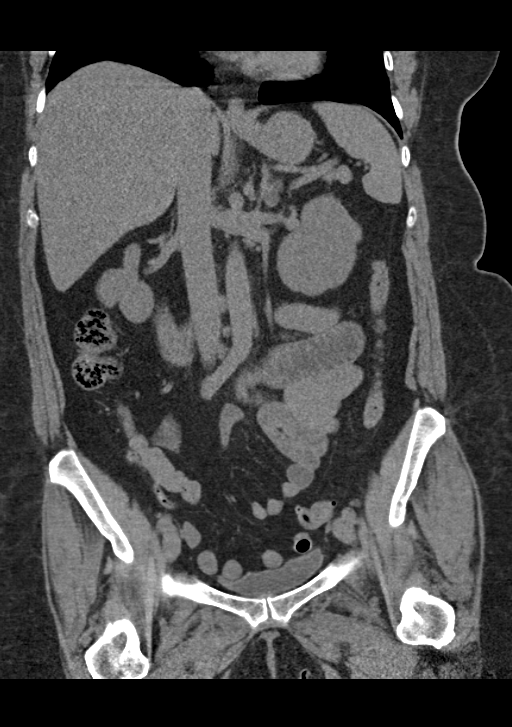

[15 of 46 positions shown; findings below may reference images not displayed]

FINDINGS: Lower chest: The lung bases are clear of acute process. No pleural
effusion or worrisome pulmonary nodule. The heart is normal in size.
No pericardial effusion. The distal esophagus is grossly normal.

Hepatobiliary: No focal hepatic lesions or intrahepatic biliary
dilatation. The gallbladder is normal. No common bile duct
dilatation.

Pancreas: Normal

Spleen: Normal

Adrenals/Urinary Tract: The adrenal glands are normal and stable.
There are advanced scarring changes involving the right kidney along
with renal calculi. No hydronephrosis or obstructing ureteral
calculi. The left kidney demonstrates mild compensatory hypertrophy.
Small renal calculi are noted. No hydronephrosis or obstructing
ureteral calculi. No bladder calculi.

Stomach/Bowel: The stomach, duodenum, small bowel and colon are
grossly normal without oral contrast. No inflammatory changes, mass
lesions or obstructive findings. Moderate sigmoid diverticulosis but
no findings for acute diverticulitis. The terminal ileum is normal.
The appendix is normal.

Vascular/Lymphatic: No mesenteric or retroperitoneal mass or
adenopathy. The aorta is normal in caliber.

Other: No pelvic mass or adenopathy. No free pelvic fluid
collections. The uterus is surgically absent. No inguinal mass or
adenopathy.

Musculoskeletal: No significant bony findings. Stable degenerative
changes involving the spine.
IMPRESSION: 1. No acute abdominal/pelvic findings, mass lesions or adenopathy. A
2. Chronic scarring changes involving a small right kidney.
3. Bilateral renal calculi but no obstructing ureteral calculi.

## 2017-03-03 ENCOUNTER — Inpatient Hospital Stay: Admission: RE | Admit: 2017-03-03 | Payer: PRIVATE HEALTH INSURANCE | Source: Ambulatory Visit

## 2017-03-04 ENCOUNTER — Inpatient Hospital Stay: Admission: RE | Admit: 2017-03-04 | Payer: PRIVATE HEALTH INSURANCE | Source: Ambulatory Visit

## 2017-03-06 ENCOUNTER — Encounter
Admission: RE | Admit: 2017-03-06 | Discharge: 2017-03-06 | Disposition: A | Discharge status: Home / Self Care | Payer: No Typology Code available for payment source | Source: Ambulatory Visit | Attending: Orthopedic Surgery | Admitting: Orthopedic Surgery

## 2017-03-06 DIAGNOSIS — I82612 Acute embolism and thrombosis of superficial veins of left upper extremity: Secondary | ICD-10-CM | POA: Diagnosis not present

## 2017-03-06 DIAGNOSIS — I10 Essential (primary) hypertension: Secondary | ICD-10-CM | POA: Diagnosis not present

## 2017-03-06 DIAGNOSIS — Z01812 Encounter for preprocedural laboratory examination: Secondary | ICD-10-CM | POA: Insufficient documentation

## 2017-03-06 DIAGNOSIS — I615 Nontraumatic intracerebral hemorrhage, intraventricular: Secondary | ICD-10-CM | POA: Diagnosis not present

## 2017-03-06 DIAGNOSIS — B1920 Unspecified viral hepatitis C without hepatic coma: Secondary | ICD-10-CM | POA: Insufficient documentation

## 2017-03-06 DIAGNOSIS — M79602 Pain in left arm: Secondary | ICD-10-CM | POA: Insufficient documentation

## 2017-03-06 DIAGNOSIS — M7989 Other specified soft tissue disorders: Secondary | ICD-10-CM | POA: Insufficient documentation

## 2017-03-06 DIAGNOSIS — K56609 Unspecified intestinal obstruction, unspecified as to partial versus complete obstruction: Secondary | ICD-10-CM | POA: Insufficient documentation

## 2017-03-06 DIAGNOSIS — R51 Headache: Secondary | ICD-10-CM | POA: Diagnosis not present

## 2017-03-06 DIAGNOSIS — G936 Cerebral edema: Secondary | ICD-10-CM | POA: Insufficient documentation

## 2017-03-06 HISTORY — DX: Sleep apnea, unspecified: G47.30

## 2017-03-06 HISTORY — DX: Personal history of urinary calculi: Z87.442

## 2017-03-06 LAB — COMPREHENSIVE METABOLIC PANEL
ALT: 13 U/L — AB (ref 14–54)
AST: 18 U/L (ref 15–41)
Albumin: 4.2 g/dL (ref 3.5–5.0)
Alkaline Phosphatase: 132 U/L — ABNORMAL HIGH (ref 38–126)
Anion gap: 8 (ref 5–15)
BUN: 12 mg/dL (ref 6–20)
CHLORIDE: 104 mmol/L (ref 101–111)
CO2: 27 mmol/L (ref 22–32)
CREATININE: 0.73 mg/dL (ref 0.44–1.00)
Calcium: 10 mg/dL (ref 8.9–10.3)
GFR calc Af Amer: >60 mL/min (ref >60)
Glucose, Bld: 78 mg/dL (ref 65–99)
Potassium: 4.3 mmol/L (ref 3.5–5.1)
Sodium: 139 mmol/L (ref 135–145)
TOTAL PROTEIN: 7.8 g/dL (ref 6.5–8.1)
Total Bilirubin: 0.6 mg/dL (ref 0.3–1.2)

## 2017-03-06 LAB — CBC
HEMATOCRIT: 43.5 % (ref 35.0–47.0)
Hemoglobin: 14.7 g/dL (ref 12.0–16.0)
MCH: 31.1 pg (ref 26.0–34.0)
MCHC: 33.8 g/dL (ref 32.0–36.0)
MCV: 91.9 fL (ref 80.0–100.0)
Platelets: 230 10*3/uL (ref 150–440)
RBC: 4.73 MIL/uL (ref 3.80–5.20)
RDW: 14.3 % (ref 11.5–14.5)
WBC: 8.4 10*3/uL (ref 3.6–11.0)

## 2017-03-06 NOTE — Patient Instructions (Signed)
Your procedure is scheduled on:March 11, 2017 Report to Same Day Surgery on the 2nd floor in the [facility]. To find out your arrival time, please call [phone] between 1PM - 3PM on: March 10, 2017  REMEMBER: Instructions that are not followed completely may result in serious medical risk up to and including death; or upon the discretion of your surgeon and anesthesiologist your surgery may need to be rescheduled.  Do not eat food or drink liquids after midnight. No gum chewing or hard candies  No Alcohol for 24 hours before or after surgery.  No Smoking for 24 hours prior to surgery.  Notify your doctor if there is any change in your medical condition (cold, fever, infection).  Do not wear jewelry, make-up, hairpins, clips or nail polish.  Do not wear lotions, powders, or perfumes.   Do not shave 48 hours prior to surgery. Men may shave face and neck.  Contacts and dentures may not be worn into surgery.  Do not bring valuables to the hospital. [facility] not responsible for any belongings or valuables.   TAKE THESE MEDICATIONS THE MORNING OF SURGERY WITH A SIP OF WATER: Amlodipine Metoprolol Losartan Claritin Omeprazole take a dose night before and morning of surgery Keppra cymbalta      Use CHG Soap per office instruction   Bring your C-PAP to the hospital with you in case you may have to spend the night.  Follow recommendations from Cardiologist, Pulmonologist or PCP regarding stopping Aspirin, Coumadin, Plavix, Eliquis, Pradaxa, or Pletal.  Stop Anti-inflammatories such as Advil, Aleve, Ibuprofen, Motrin, Naproxen, Naprosyn, Goodie powder, or aspirin products. (May take Tylenol or Acetaminophen and Celebrex if needed.)  Stop supplements until after surgery. (May continue Vitamin D, Vitamin B, and multivitamin.)  If you are being admitted to the hospital overnight, leave your suitcase in the car. After surgery it may be brought to your room.  If you are  being discharged the day of surgery, you will not be allowed to drive home. You will need someone to drive you home and stay with you that night.

## 2017-03-06 NOTE — Pre-Procedure Instructions (Signed)
Pt given instruction to go to MD office to Altamese CabalMaurice Jones PA and Dr Sherryll BurgerLinthavong's office for preop clearance appt.  Pt stated she was not aware of needing appt.

## 2017-03-10 MED ORDER — CLINDAMYCIN PHOSPHATE 900 MG/50ML IV SOLN
900.0000 mg | INTRAVENOUS | Status: AC
Start: 2017-03-11 — End: 2017-03-11
  Administered 2017-03-11: 900 mg via INTRAVENOUS

## 2017-03-11 ENCOUNTER — Encounter: Payer: Self-pay | Admitting: Anesthesiology

## 2017-03-11 ENCOUNTER — Ambulatory Visit: Payer: No Typology Code available for payment source

## 2017-03-11 ENCOUNTER — Ambulatory Visit: Payer: No Typology Code available for payment source | Admitting: Anesthesiology

## 2017-03-11 ENCOUNTER — Encounter
Admission: RE | Disposition: A | Discharge status: Home / Self Care | Payer: Self-pay | Source: Ambulatory Visit | Attending: Orthopedic Surgery

## 2017-03-11 ENCOUNTER — Ambulatory Visit
Admission: RE | Admit: 2017-03-11 | Discharge: 2017-03-11 | Disposition: A | Discharge status: Home / Self Care | Payer: No Typology Code available for payment source | Source: Ambulatory Visit | Attending: Orthopedic Surgery | Admitting: Orthopedic Surgery

## 2017-03-11 DIAGNOSIS — Z79899 Other long term (current) drug therapy: Secondary | ICD-10-CM | POA: Insufficient documentation

## 2017-03-11 DIAGNOSIS — K589 Irritable bowel syndrome without diarrhea: Secondary | ICD-10-CM | POA: Insufficient documentation

## 2017-03-11 DIAGNOSIS — E119 Type 2 diabetes mellitus without complications: Secondary | ICD-10-CM | POA: Diagnosis not present

## 2017-03-11 DIAGNOSIS — Z8673 Personal history of transient ischemic attack (TIA), and cerebral infarction without residual deficits: Secondary | ICD-10-CM | POA: Diagnosis not present

## 2017-03-11 DIAGNOSIS — B1920 Unspecified viral hepatitis C without hepatic coma: Secondary | ICD-10-CM | POA: Diagnosis not present

## 2017-03-11 DIAGNOSIS — G4730 Sleep apnea, unspecified: Secondary | ICD-10-CM | POA: Diagnosis not present

## 2017-03-11 DIAGNOSIS — I10 Essential (primary) hypertension: Secondary | ICD-10-CM | POA: Diagnosis not present

## 2017-03-11 DIAGNOSIS — X58XXXA Exposure to other specified factors, initial encounter: Secondary | ICD-10-CM | POA: Diagnosis not present

## 2017-03-11 DIAGNOSIS — S42211A Unspecified displaced fracture of surgical neck of right humerus, initial encounter for closed fracture: Secondary | ICD-10-CM | POA: Insufficient documentation

## 2017-03-11 DIAGNOSIS — Z419 Encounter for procedure for purposes other than remedying health state, unspecified: Secondary | ICD-10-CM

## 2017-03-11 HISTORY — PX: HUMERUS IM NAIL: SHX1769

## 2017-03-11 LAB — GLUCOSE, CAPILLARY
GLUCOSE-CAPILLARY: 88 mg/dL (ref 65–99)
GLUCOSE-CAPILLARY: 179 mg/dL — AB (ref 65–99)

## 2017-03-11 SURGERY — INSERTION, INTRAMEDULLARY ROD, HUMERUS
Anesthesia: Choice | Site: Shoulder | Laterality: Right | Wound class: Clean

## 2017-03-11 MED ORDER — FENTANYL CITRATE (PF) 100 MCG/2ML IJ SOLN
INTRAMUSCULAR | Status: AC
  Administered 2017-03-11: 50 ug via INTRAVENOUS
  Filled 2017-03-11: qty 2

## 2017-03-11 MED ORDER — OXYCODONE HCL 5 MG PO TABS: 5.0000 mg | ORAL_TABLET | ORAL | Status: DC | PRN

## 2017-03-11 MED ORDER — OXYCODONE HCL 5 MG PO TABS: 5.0000 mg | ORAL_TABLET | ORAL | 0 refills | Status: DC | PRN

## 2017-03-11 MED ORDER — PROPOFOL 10 MG/ML IV BOLUS
INTRAVENOUS | Status: AC
  Filled 2017-03-11: qty 20

## 2017-03-11 MED ORDER — SODIUM CHLORIDE 0.9 % IR SOLN
Status: DC | PRN
  Administered 2017-03-11: 1000 mL

## 2017-03-11 MED ORDER — SUCCINYLCHOLINE CHLORIDE 20 MG/ML IJ SOLN
INTRAMUSCULAR | Status: DC | PRN
  Administered 2017-03-11: 80 mg via INTRAVENOUS

## 2017-03-11 MED ORDER — SUGAMMADEX SODIUM 200 MG/2ML IV SOLN
INTRAVENOUS | Status: AC
  Filled 2017-03-11: qty 2

## 2017-03-11 MED ORDER — SODIUM CHLORIDE 0.9 % IJ SOLN
INTRAMUSCULAR | Status: AC
  Filled 2017-03-11: qty 10

## 2017-03-11 MED ORDER — ACETAMINOPHEN 10 MG/ML IV SOLN
INTRAVENOUS | Status: DC | PRN
  Administered 2017-03-11: 1000 mg via INTRAVENOUS

## 2017-03-11 MED ORDER — ONDANSETRON HCL 4 MG/2ML IJ SOLN: 4.0000 mg | Freq: Four times a day (QID) | INTRAMUSCULAR | Status: DC | PRN

## 2017-03-11 MED ORDER — ROCURONIUM BROMIDE 50 MG/5ML IV SOLN
INTRAVENOUS | Status: AC
  Filled 2017-03-11: qty 1

## 2017-03-11 MED ORDER — MIDAZOLAM HCL 2 MG/2ML IJ SOLN
INTRAMUSCULAR | Status: AC
  Administered 2017-03-11: 1 mg via INTRAVENOUS
  Filled 2017-03-11: qty 2

## 2017-03-11 MED ORDER — ONDANSETRON HCL 4 MG PO TABS: 4.0000 mg | ORAL_TABLET | Freq: Four times a day (QID) | ORAL | Status: DC | PRN

## 2017-03-11 MED ORDER — PROPOFOL 10 MG/ML IV BOLUS
INTRAVENOUS | Status: DC | PRN
  Administered 2017-03-11: 50 mg via INTRAVENOUS
  Administered 2017-03-11: 150 mg via INTRAVENOUS

## 2017-03-11 MED ORDER — BUPIVACAINE-EPINEPHRINE (PF) 0.25% -1:200000 IJ SOLN
INTRAMUSCULAR | Status: DC | PRN
  Administered 2017-03-11: 19 mL via PERINEURAL

## 2017-03-11 MED ORDER — PREGABALIN 75 MG PO CAPS
ORAL_CAPSULE | ORAL | Status: AC
  Filled 2017-03-11: qty 2

## 2017-03-11 MED ORDER — ONDANSETRON HCL 4 MG/2ML IJ SOLN: 4.0000 mg | Freq: Once | INTRAMUSCULAR | Status: DC | PRN

## 2017-03-11 MED ORDER — LIDOCAINE HCL (PF) 2 % IJ SOLN
INTRAMUSCULAR | Status: AC
  Filled 2017-03-11: qty 2

## 2017-03-11 MED ORDER — OXYCODONE HCL 5 MG PO TABS
ORAL_TABLET | ORAL | Status: AC
  Filled 2017-03-11: qty 1

## 2017-03-11 MED ORDER — ESMOLOL HCL 100 MG/10ML IV SOLN
INTRAVENOUS | Status: DC | PRN
  Administered 2017-03-11: 30 mg via INTRAVENOUS

## 2017-03-11 MED ORDER — FENTANYL CITRATE (PF) 100 MCG/2ML IJ SOLN
25.0000 ug | INTRAMUSCULAR | Status: DC | PRN
  Administered 2017-03-11 (×3): 25 ug via INTRAVENOUS

## 2017-03-11 MED ORDER — PHENYLEPHRINE HCL 10 MG/ML IJ SOLN
INTRAMUSCULAR | Status: DC | PRN
  Administered 2017-03-11 (×2): 200 ug via INTRAVENOUS

## 2017-03-11 MED ORDER — CHLORHEXIDINE GLUCONATE 4 % EX LIQD: 60.0000 mL | Freq: Once | CUTANEOUS | Status: DC

## 2017-03-11 MED ORDER — FENTANYL CITRATE (PF) 100 MCG/2ML IJ SOLN
INTRAMUSCULAR | Status: AC
  Filled 2017-03-11: qty 2

## 2017-03-11 MED ORDER — MIDAZOLAM HCL 2 MG/2ML IJ SOLN
INTRAMUSCULAR | Status: AC
  Filled 2017-03-11: qty 2

## 2017-03-11 MED ORDER — MIDAZOLAM HCL 2 MG/2ML IJ SOLN
1.0000 mg | Freq: Once | INTRAMUSCULAR | Status: AC
  Administered 2017-03-11: 1 mg via INTRAVENOUS

## 2017-03-11 MED ORDER — DEXAMETHASONE SODIUM PHOSPHATE 10 MG/ML IJ SOLN
INTRAMUSCULAR | Status: DC | PRN
  Administered 2017-03-11: 10 mg via INTRAVENOUS

## 2017-03-11 MED ORDER — EPHEDRINE SULFATE 50 MG/ML IJ SOLN
INTRAMUSCULAR | Status: DC | PRN
  Administered 2017-03-11: 10 mg via INTRAVENOUS

## 2017-03-11 MED ORDER — ACETAMINOPHEN 500 MG PO TABS
1000.0000 mg | ORAL_TABLET | Freq: Once | ORAL | Status: AC
  Administered 2017-03-11: 1000 mg via ORAL

## 2017-03-11 MED ORDER — SUGAMMADEX SODIUM 200 MG/2ML IV SOLN
INTRAVENOUS | Status: DC | PRN
  Administered 2017-03-11: 157.8 mg via INTRAVENOUS

## 2017-03-11 MED ORDER — CLINDAMYCIN PHOSPHATE 900 MG/50ML IV SOLN
INTRAVENOUS | Status: AC
  Filled 2017-03-11: qty 50

## 2017-03-11 MED ORDER — ROPIVACAINE HCL 5 MG/ML IJ SOLN
INTRAMUSCULAR | Status: DC | PRN
  Administered 2017-03-11: 20 mL via EPIDURAL

## 2017-03-11 MED ORDER — ACETAMINOPHEN 10 MG/ML IV SOLN
INTRAVENOUS | Status: AC
Start: 2017-03-11 — End: 2017-03-11
  Filled 2017-03-11: qty 100

## 2017-03-11 MED ORDER — BUPIVACAINE-EPINEPHRINE (PF) 0.25% -1:200000 IJ SOLN
INTRAMUSCULAR | Status: AC
  Filled 2017-03-11: qty 30

## 2017-03-11 MED ORDER — ROCURONIUM BROMIDE 100 MG/10ML IV SOLN
INTRAVENOUS | Status: DC | PRN
  Administered 2017-03-11: 5 mg via INTRAVENOUS
  Administered 2017-03-11: 40 mg via INTRAVENOUS
  Administered 2017-03-11: 10 mg via INTRAVENOUS
  Administered 2017-03-11: 20 mg via INTRAVENOUS

## 2017-03-11 MED ORDER — ONDANSETRON HCL 4 MG/2ML IJ SOLN
INTRAMUSCULAR | Status: DC | PRN
  Administered 2017-03-11: 4 mg via INTRAVENOUS

## 2017-03-11 MED ORDER — ONDANSETRON HCL 4 MG/2ML IJ SOLN
INTRAMUSCULAR | Status: AC
Start: 2017-03-11 — End: 2017-03-11
  Filled 2017-03-11: qty 2

## 2017-03-11 MED ORDER — ROPIVACAINE HCL 5 MG/ML IJ SOLN
INTRAMUSCULAR | Status: AC
  Filled 2017-03-11: qty 30

## 2017-03-11 MED ORDER — SODIUM CHLORIDE 0.9 % IV SOLN: INTRAVENOUS | Status: DC

## 2017-03-11 MED ORDER — BACITRACIN 50000 UNITS IM SOLR
INTRAMUSCULAR | Status: AC
  Filled 2017-03-11: qty 1

## 2017-03-11 MED ORDER — LIDOCAINE HCL (CARDIAC) 20 MG/ML IV SOLN
INTRAVENOUS | Status: DC | PRN
  Administered 2017-03-11: 50 mg via INTRAVENOUS

## 2017-03-11 MED ORDER — HYDROMORPHONE HCL 1 MG/ML IJ SOLN: 0.5000 mg | INTRAMUSCULAR | Status: DC | PRN

## 2017-03-11 MED ORDER — GABAPENTIN 300 MG PO CAPS: 300.0000 mg | ORAL_CAPSULE | Freq: Once | ORAL | Status: DC

## 2017-03-11 MED ORDER — FENTANYL CITRATE (PF) 100 MCG/2ML IJ SOLN
50.0000 ug | Freq: Once | INTRAMUSCULAR | Status: AC
  Administered 2017-03-11: 50 ug via INTRAVENOUS

## 2017-03-11 MED ORDER — ACETAMINOPHEN 500 MG PO TABS
ORAL_TABLET | ORAL | Status: AC
  Administered 2017-03-11: 1000 mg via ORAL
  Filled 2017-03-11: qty 2

## 2017-03-11 MED ORDER — MIDAZOLAM HCL 5 MG/5ML IJ SOLN
INTRAMUSCULAR | Status: DC | PRN
  Administered 2017-03-11: 2 mg via INTRAVENOUS

## 2017-03-11 MED ORDER — PREGABALIN 75 MG PO CAPS
150.0000 mg | ORAL_CAPSULE | Freq: Once | ORAL | Status: AC
  Administered 2017-03-11: 150 mg via ORAL

## 2017-03-11 MED ORDER — SODIUM CHLORIDE 0.9 % IV SOLN
INTRAVENOUS | Status: DC | PRN
  Administered 2017-03-11: 50 ug/min via INTRAVENOUS

## 2017-03-11 MED ORDER — DEXTROSE-NACL 5-0.9 % IV SOLN
Freq: Once | INTRAVENOUS | Status: AC
  Administered 2017-03-11: 11:00:00 via INTRAVENOUS

## 2017-03-11 MED ORDER — DEXAMETHASONE SODIUM PHOSPHATE 10 MG/ML IJ SOLN
INTRAMUSCULAR | Status: AC
  Filled 2017-03-11: qty 1

## 2017-03-11 MED ORDER — FENTANYL CITRATE (PF) 100 MCG/2ML IJ SOLN
INTRAMUSCULAR | Status: DC | PRN
  Administered 2017-03-11: 100 ug via INTRAVENOUS

## 2017-03-11 SURGICAL SUPPLY — 49 items
2.5MM IMPLANT
8.0mm Right Multiloc Proximal Humerous Nail (Nail) IMPLANT
BASIN GRAD PLASTIC 32OZ STRL (MISCELLANEOUS) ×3 IMPLANT
BIT DRILL 3.8MMX270MM (BIT) ×1
BIT DRILL 3.8X270 (BIT) ×2 IMPLANT
BIT DRILL 6.5 CONICAL (BIT) ×2 IMPLANT
BIT DRILL 6.5MM CONICAL (BIT) ×1
BIT DRILL CAL 3.2 LONG (BIT) ×2 IMPLANT
BIT DRILL CAL 3.2MM LONG (BIT) ×1
BRUSH SCRUB EZ  4% CHG (MISCELLANEOUS) ×4
BRUSH SCRUB EZ 4% CHG (MISCELLANEOUS) ×2 IMPLANT
CANISTER SUCT 1200ML W/VALVE (MISCELLANEOUS) ×3 IMPLANT
CHLORAPREP W/TINT 26ML (MISCELLANEOUS) ×3 IMPLANT
CRADLE LAMINECT ARM (MISCELLANEOUS) ×3 IMPLANT
DECANTER SPIKE VIAL GLASS SM (MISCELLANEOUS) ×3 IMPLANT
DRAPE C-ARM XRAY 36X54 (DRAPES) ×6 IMPLANT
DRAPE IMP U-DRAPE 54X76 (DRAPES) ×3 IMPLANT
DRAPE U-SHAPE 47X51 STRL (DRAPES) ×6 IMPLANT
DRSG AQUACEL AG ADV 3.5X 4 (GAUZE/BANDAGES/DRESSINGS) ×9 IMPLANT
ELECT REM PT RETURN 9FT ADLT (ELECTROSURGICAL) ×3
ELECTRODE REM PT RTRN 9FT ADLT (ELECTROSURGICAL) ×1 IMPLANT
GAUZE PETRO XEROFOAM 1X8 (MISCELLANEOUS) ×3 IMPLANT
GAUZE SPONGE 4X4 12PLY STRL (GAUZE/BANDAGES/DRESSINGS) IMPLANT
GLOVE INDICATOR 8.0 STRL GRN (GLOVE) ×3 IMPLANT
GLOVE SURG ORTHO 8.0 STRL STRW (GLOVE) ×6 IMPLANT
GOWN STRL REUS W/ TWL LRG LVL3 (GOWN DISPOSABLE) ×2 IMPLANT
GOWN STRL REUS W/ TWL XL LVL3 (GOWN DISPOSABLE) ×1 IMPLANT
GOWN STRL REUS W/TWL LRG LVL3 (GOWN DISPOSABLE) ×6
GOWN STRL REUS W/TWL XL LVL3 (GOWN DISPOSABLE) ×3
GUIDEROD SS 2.5MMX280MM (ROD) ×3 IMPLANT
HEMOVAC 400ML (MISCELLANEOUS)
K-WIRE TROCAR POINT 2.5X280 (WIRE) ×6
KIT DRAIN HEMOVAC JP 7FR 400ML (MISCELLANEOUS) IMPLANT
KIT RM TURNOVER STRD PROC AR (KITS) ×3 IMPLANT
KWIRE TROCAR POINT 2.5X280 (WIRE) ×2 IMPLANT
NAIL CANN SHORT 8MM 160M RIGHT (Nail) ×3 IMPLANT
NS IRRIG 1000ML POUR BTL (IV SOLUTION) ×3 IMPLANT
PACK ARTHROSCOPY SHOULDER (MISCELLANEOUS) ×3 IMPLANT
PENCIL ELECTRO HAND CTR (MISCELLANEOUS) ×3 IMPLANT
ROD REAMING 2.5 (Rod) ×3 IMPLANT
SCREW LOCK 4.5X36 TI FT (Screw) ×3 IMPLANT
SCREW LOCK 4.5X38 TI FT (Screw) ×3 IMPLANT
SCREW LOCK 4X24 TI (Screw) ×3 IMPLANT
SCREW LOCK 4X26 TI (Screw) ×3 IMPLANT
SCREW LOCK MULTILOC FT 4.5X30 (Screw) ×3 IMPLANT
SLING ARM LRG DEEP (SOFTGOODS) ×3 IMPLANT
STAPLER SKIN PROX 35W (STAPLE) ×3 IMPLANT
STRAP SAFETY BODY (MISCELLANEOUS) ×6 IMPLANT
TAPE TRANSPORE STRL 2 31045 (GAUZE/BANDAGES/DRESSINGS) ×3 IMPLANT

## 2017-03-11 NOTE — Anesthesia Procedure Notes (Signed)
Procedure Name: Intubation Performed by: Lance Muss Pre-anesthesia Checklist: Patient identified, Emergency Drugs available, Suction available, Patient being monitored and Timeout performed Patient Re-evaluated:Patient Re-evaluated prior to inductionOxygen Delivery Method: Circle system utilized Preoxygenation: Pre-oxygenation with 100% oxygen Intubation Type: IV induction Ventilation: Mask ventilation without difficulty Laryngoscope Size: 3 and McGraph Grade View: Grade II Tube type: Oral Tube size: 7.0 mm Number of attempts: 1 Airway Equipment and Method: Stylet Placement Confirmation: ETT inserted through vocal cords under direct vision,  positive ETCO2 and breath sounds checked- equal and bilateral Secured at: 20 cm Tube secured with: Tape Dental Injury: Teeth and Oropharynx as per pre-operative assessment  Difficulty Due To: Difficult Airway- due to anterior larynx, Difficult Airway- due to dentition and Difficult Airway- due to limited oral opening Future Recommendations: Recommend- induction with short-acting agent, and alternative techniques readily available Comments: First attempt with MAC 3 with grade 3 view. Second attempt with Mcgraph 3, grade 3 view. Advanced ETT through bougie stylet. +ETCO2, +BBS

## 2017-03-11 NOTE — Anesthesia Procedure Notes (Signed)
Anesthesia Regional Block: Interscalene brachial plexus block   Pre-Anesthetic Checklist: ,, timeout performed, Correct Patient, Correct Site, Correct Laterality, Correct Procedure, Correct Position, site marked, Risks and benefits discussed,  Surgical consent,  Pre-op evaluation,  At surgeon's request and post-op pain management   Prep: Betadine       Needles:  Injection technique: Single-shot  Needle Type: Echogenic Stimulator Needle     Needle Length: 5cm  Needle Gauge: 21     Additional Needles:   Procedures: ultrasound guided, nerve stimulator,,,,,,   Nerve Stimulator or Paresthesia:  Response: biceps flexion, 0.8 mA,   Additional Responses:   Narrative:  Injection made incrementally with aspirations every 5 mL.  Performed by: Personally  Anesthesiologist: Bronwyn Belasco  Additional Notes: Functioning IV was confirmed and monitors were applied.  A 50mm 22ga Arrow echogenic stimulator needle was used. Sterile prep and drape,hand hygiene and sterile gloves were used.  Negative aspiration and negative test dose prior to incremental administration of local anesthetic. The patient tolerated the procedure well.  Ultrasound guidance: relevent anatomy identified, needle position confirmed, local anesthetic spread visualized around nerve(s), vascular puncture avoided.  Image printed for medical record.       

## 2017-03-11 NOTE — Anesthesia Post-op Follow-up Note (Cosign Needed)
Anesthesia QCDR form completed.        

## 2017-03-11 NOTE — Transfer of Care (Signed)
Immediate Anesthesia Transfer of Care Note  Patient: Stephanie GuerinDianna B [name]  Procedure(s) Performed: Procedure(s): INTRAMEDULLARY (IM) NAIL HUMERAL (Right)  Patient Location: PACU  Anesthesia Type:General  Level of Consciousness: awake and sedated  Airway & Oxygen Therapy: Patient Spontanous Breathing and Patient connected to face mask oxygen  Post-op Assessment: Report given to RN and Post -op Vital signs reviewed and stable  Post vital signs: Reviewed and stable  Last Vitals:  Vitals:   03/11/17 1248 03/11/17 1534  BP: 124/75 130/81  Pulse: 83 72  Resp: 16 17  Temp:      Last Pain:  Vitals:   03/11/17 1248  TempSrc:   PainSc: 3          Complications: No apparent anesthesia complications

## 2017-03-11 NOTE — H&P (Signed)
The patient has been re-examined, and the chart reviewed, and there have been no interval changes to the documented history and physical.  Plan a right humeral nailing today.  Anesthesia is consulted regarding a peripheral nerve block for post-operative pain.  The risks, benefits, and alternatives have been discussed at length, and the patient is willing to proceed.

## 2017-03-11 NOTE — Anesthesia Postprocedure Evaluation (Signed)
Anesthesia Post Note  Patient: Stephanie GuerinDianna B [name]  Procedure(s) Performed: Procedure(s) (LRB): INTRAMEDULLARY (IM) NAIL HUMERAL (Right)  Patient location during evaluation: PACU Anesthesia Type: General Level of consciousness: awake and alert Pain management: pain level controlled Vital Signs Assessment: post-procedure vital signs reviewed and stable Respiratory status: spontaneous breathing, nonlabored ventilation, respiratory function stable and patient connected to nasal cannula oxygen Cardiovascular status: blood pressure returned to baseline and stable Postop Assessment: no signs of nausea or vomiting Anesthetic complications: no     Last Vitals:  Vitals:   03/11/17 1648 03/11/17 1748  BP: 137/87 128/78  Pulse: 72 78  Resp: 15 16  Temp: (!) 36.3 C     Last Pain:  Vitals:   03/11/17 1748  TempSrc:   PainSc: 4                  Kaitlynne Wenz S

## 2017-03-11 NOTE — Anesthesia Preprocedure Evaluation (Addendum)
Anesthesia Evaluation  Patient identified by MRN, date of birth, ID band Patient awake    Reviewed: Allergy & Precautions, NPO status , Patient's Chart, lab work & pertinent test results, reviewed documented beta blocker date and time   Airway Mallampati: III  TM Distance: >3 FB     Dental  (+) Chipped   Pulmonary sleep apnea , Current Smoker,           Cardiovascular hypertension, Pt. on medications and Pt. on home beta blockers      Neuro/Psych  Headaches,  Neuromuscular disease CVA    GI/Hepatic (+) Hepatitis -, C  Endo/Other  diabetes, Type 2  Renal/GU Renal disease     Musculoskeletal   Abdominal   Peds  Hematology   Anesthesia Other Findings IBS.  Reproductive/Obstetrics                            Anesthesia Physical Anesthesia Plan  ASA: IV  Anesthesia Plan:    Post-op Pain Management:    Induction:   PONV Risk Score and Plan:   Airway Management Planned:   Additional Equipment:   Intra-op Plan:   Post-operative Plan:   Informed Consent: I have reviewed the patients History and Physical, chart, labs and discussed the procedure including the risks, benefits and alternatives for the proposed anesthesia with the patient or authorized representative who has indicated his/her understanding and acceptance.     Plan Discussed with: CRNA  Anesthesia Plan Comments:         Anesthesia Quick Evaluation

## 2017-03-11 NOTE — Discharge Instructions (Addendum)
AMBULATORY SURGERY  DISCHARGE INSTRUCTIONS   1) The drugs that you were given will stay in your system until tomorrow so for the next 24 hours you should not:  A) Drive an automobile B) Make any legal decisions C) Drink any alcoholic beverage   2) You may resume regular meals tomorrow.  Today it is better to start with liquids and gradually work up to solid foods.  You may eat anything you prefer, but it is better to start with liquids, then soup and crackers, and gradually work up to solid foods.   3) Please notify your doctor immediately if you have any unusual bleeding, trouble breathing, redness and pain at the surgery site, drainage, fever, or pain not relieved by medication.    4) Additional Instructions:Keep right arm in a sling as instructed by the doctor.  Do not elevate arm above your head or out to your side. **  Reinforce dressing on shoulder as needed.  **  Remove dressing in 4 days.  **  You may shower once dressing removed.  **  Keep incision dry and put a bandaid over site.   **  Take pain medication as needed.  **   Stool softeners should accompany the narcotic.  Have some food in your stomach before taking pain meds.  **  Sleeping in a recliner for the first night or two might be more comfortable than the bed.  If not possible, then elevate head in bed with a pillow under the right elbow.  Please contact your physician with any problems or Same Day Surgery at [phone], Monday through Friday 6 am to 4 pm, or [place] at [facility] Main number at [phone].

## 2017-03-11 NOTE — Op Note (Signed)
03/11/2017  4:25 PM  PATIENT:  Stephanie [name]    PRE-OPERATIVE DIAGNOSIS:  S49.001G Unspec fracture upper end of right humerus  POST-OPERATIVE DIAGNOSIS:  Same  PROCEDURE:  INTRAMEDULLARY (IM) NAIL HUMERAL  SURGEON:  Lyndle HerrlichJames R Jennfier Abdulla, MD  ANESTHESIA:   General Endotracheal and Regional Block  PREOPERATIVE INDICATIONS:  Stephanie GuerinDianna B Glaude is a  51 y.o. female with a diagnosis of S49.001G Unspec fracture upper end of right humerus who failed conservative measures and elected for surgical management.    The risks benefits and alternatives were discussed with the patient preoperatively including but not limited to the risks of infection, bleeding, nerve injury, cardiopulmonary complications, the need for revision surgery, among others, and the patient was willing to proceed.  EBL: 50 cc  TOURNIQUET TIME:  None used  OPERATIVE IMPLANTS: Synthes short humeral nail  OPERATIVE FINDINGS: Comminuted, displaced fracture of the proximal humerus with delayed union       OPERATIVE PROCEDURE:   The patient was brought to the operating room and underwent satisfactory general endotracheal anesthesia in the supine position. A regional block had been performed by the anesthesia team.The bed was repositioned to the beachchair position. The operative arm was prepped and draped in a sterile fashion. 1/4% Marcaine with epinephrine was placed in the proximal and distal incisional areas. A short saber incision was made off the edge of the acromion and dissection carried out bluntly through subcutaneous tissue. Muscle was split bluntly at the anterior edge of the acromium and bursectomy carried out. An incision was made in the supraspinatus cuff tissue and a guidepin inserted. Fluoroscopy showed the guide pin to be in satisfactory position with a good entry point. A reamer was used to enlarge the opening in the humerus. The ball tip guide was advanced down the humerus to pass across the fracture.  Fluoroscopy showed the guide pin to be in good position. The synthes short nail was introduced and seated completely and slightly countersunk. Three screws were introduced through small stab wounds lateral to medial fixing the rod proximally. The distal holes in the nail were located using the external guide and a small incision made over this. Blunt dissection was carried out through muscle down to bone. Using fluoroscopy, the hole was drilled and two distal screws were introduced and seated snugly. Fluoroscopy showed the hardware to be in good position and the fracture be well aligned. The wounds were irrigated and the rotator cuff closed with 0 Vicryl suture. Subcutaneous tissues were closed with 0 and 2-0 Vicryl. Staples were used for the skin. Dry sterile dressings and a sling were applied. Sponge and needle counts were correct. Patient was awakened and taken to recovery in good condition.   Dola ArgyleJames R. Odis LusterBowers, MD

## 2017-03-16 ENCOUNTER — Encounter: Payer: Self-pay | Admitting: Emergency Medicine

## 2017-03-16 ENCOUNTER — Emergency Department
Admission: EM | Admit: 2017-03-16 | Discharge: 2017-03-16 | Disposition: A | Discharge status: Home / Self Care | Payer: No Typology Code available for payment source | Attending: Emergency Medicine | Admitting: Emergency Medicine

## 2017-03-16 DIAGNOSIS — E119 Type 2 diabetes mellitus without complications: Secondary | ICD-10-CM | POA: Diagnosis not present

## 2017-03-16 DIAGNOSIS — F17210 Nicotine dependence, cigarettes, uncomplicated: Secondary | ICD-10-CM | POA: Insufficient documentation

## 2017-03-16 DIAGNOSIS — G8918 Other acute postprocedural pain: Secondary | ICD-10-CM | POA: Insufficient documentation

## 2017-03-16 DIAGNOSIS — I10 Essential (primary) hypertension: Secondary | ICD-10-CM | POA: Diagnosis not present

## 2017-03-16 DIAGNOSIS — Z7902 Long term (current) use of antithrombotics/antiplatelets: Secondary | ICD-10-CM | POA: Insufficient documentation

## 2017-03-16 DIAGNOSIS — Z79899 Other long term (current) drug therapy: Secondary | ICD-10-CM | POA: Insufficient documentation

## 2017-03-16 DIAGNOSIS — Z7984 Long term (current) use of oral hypoglycemic drugs: Secondary | ICD-10-CM | POA: Diagnosis not present

## 2017-03-16 DIAGNOSIS — M79601 Pain in right arm: Secondary | ICD-10-CM | POA: Diagnosis present

## 2017-03-16 LAB — COMPREHENSIVE METABOLIC PANEL
ALK PHOS: 106 U/L (ref 38–126)
ALT: 11 U/L — ABNORMAL LOW (ref 14–54)
ANION GAP: 8 (ref 5–15)
AST: 17 U/L (ref 15–41)
Albumin: 3.7 g/dL (ref 3.5–5.0)
BUN: 14 mg/dL (ref 6–20)
CALCIUM: 9.7 mg/dL (ref 8.9–10.3)
CO2: 28 mmol/L (ref 22–32)
Chloride: 101 mmol/L (ref 101–111)
Creatinine, Ser: 0.6 mg/dL (ref 0.44–1.00)
Glucose, Bld: 163 mg/dL — ABNORMAL HIGH (ref 65–99)
Potassium: 4 mmol/L (ref 3.5–5.1)
Sodium: 137 mmol/L (ref 135–145)
Total Bilirubin: 0.5 mg/dL (ref 0.3–1.2)
Total Protein: 7.3 g/dL (ref 6.5–8.1)

## 2017-03-16 LAB — URINALYSIS, COMPLETE (UACMP) WITH MICROSCOPIC
BILIRUBIN URINE: NEGATIVE
Bacteria, UA: NONE SEEN
HGB URINE DIPSTICK: NEGATIVE
KETONES UR: NEGATIVE mg/dL
Nitrite: NEGATIVE
Protein, ur: NEGATIVE mg/dL
Specific Gravity, Urine: 1.014 (ref 1.005–1.030)
pH: 6 (ref 5.0–8.0)

## 2017-03-16 LAB — CBC
HCT: 40.8 % (ref 35.0–47.0)
HEMOGLOBIN: 14 g/dL (ref 12.0–16.0)
MCH: 31.4 pg (ref 26.0–34.0)
MCHC: 34.2 g/dL (ref 32.0–36.0)
MCV: 91.8 fL (ref 80.0–100.0)
PLATELETS: 231 10*3/uL (ref 150–440)
RBC: 4.45 MIL/uL (ref 3.80–5.20)
RDW: 13.7 % (ref 11.5–14.5)
WBC: 8.6 10*3/uL (ref 3.6–11.0)

## 2017-03-16 LAB — LIPASE, BLOOD: LIPASE: 22 U/L (ref 11–51)

## 2017-03-16 MED ORDER — HYDROMORPHONE HCL 1 MG/ML IJ SOLN
1.0000 mg | Freq: Once | INTRAMUSCULAR | Status: AC
  Administered 2017-03-16: 1 mg via INTRAMUSCULAR
  Filled 2017-03-16: qty 1

## 2017-03-16 MED ORDER — FOSFOMYCIN TROMETHAMINE 3 G PO PACK
3.0000 g | PACK | Freq: Once | ORAL | Status: AC
  Administered 2017-03-16: 3 g via ORAL
  Filled 2017-03-16: qty 3

## 2017-03-16 MED ORDER — ONDANSETRON 4 MG PO TBDP
4.0000 mg | ORAL_TABLET | Freq: Once | ORAL | Status: AC | PRN
  Administered 2017-03-16: 4 mg via ORAL
  Filled 2017-03-16: qty 1

## 2017-03-16 NOTE — ED Triage Notes (Signed)
Patient presents to the ED with headache and right shoulder pain as well as vomiting.  Patient states she had shoulder surgery on Tuesday and started having vomiting on Wednesday.  Patient states, "I usually vomit about 6945min-1 hour after taking the oxycodone pain medication for my shoulder.  Patient states she began to have a headache on Thursday and pain is not controlled in shoulder or head with her pain medication.  Patient states she has been taking extra pain medication but it is still not working.  Patient states, "they gave me 75 and I only have about 25 left."

## 2017-03-16 NOTE — ED Provider Notes (Signed)
[facility] Emergency Department Provider Note  Time seen: 6:30 PM  I have reviewed the triage vital signs and the nursing notes.   HISTORY  Chief Complaint Headache; Emesis; and Post-op Problem    HPI Stephanie [name] is a 51 y.o. female with a past medical history of diabetes, hypertension, recent right humerus surgeries 03/11/17 who presents the emergency department for increased right arm pain. According to the patient and record review the patient had a humerus surgery perform 03/11/17 due to a humerus fracture. Patient states she has been taking her pain medication at home which has been making her nauseated. She has talked to her orthopedist about the increased pain levels he recommended doubling her medication. She states she is continuing to take the medication but the pain is worse so she called back and they told her to come to the ER for pain relief. Here overall the patient appears well, no distress but the patient does state severe pain in the right arm.  Past Medical History:  Diagnosis Date  . Diabetes mellitus without complication (HCC)   . Diverticulitis   . Headache   . Hepatitis C   . History of kidney stones   . Hypertension   . IBS (irritable bowel syndrome)   . Kidney stones   . Sciatica   . Sleep apnea   . Stroke [facility])     Patient Active Problem List   Diagnosis Date Noted  . Headache 01/07/2017  . Small bowel obstruction (HCC) 09/02/2016  . Pain and swelling of left upper extremity 07/22/2016  . Superficial venous thrombosis of arm, left 07/22/2016  . ICH (intracerebral hemorrhage) (HCC)   . Essential hypertension, malignant 04/19/2016  . Cytotoxic brain edema (HCC) 04/19/2016  . IVH (intraventricular hemorrhage) (HCC) 04/18/2016  . Hepatitis C     Past Surgical History:  Procedure Laterality Date  . ABDOMINAL HYSTERECTOMY    . carpel tunnel syndrome  2017  . KIDNEY STONE SURGERY Right 02/12/12  . KIDNEY STONE SURGERY Left  07/15/2012  . LIGATION OF ARTERIOVENOUS  FISTULA Left 06/21/2016   Procedure: LIGATION OF ARTERIOVENOUS  FISTULA ( LIGATION BASILIC VEIN );  Surgeon: Renford Dills, MD;  Location: [facility] ORS;  Service: Vascular;  Laterality: Left;  . LITHOTRIPSY    . OOPHORECTOMY    . SINUS EXPLORATION    . TONSILLECTOMY      Prior to Admission medications   Medication Sig Start Date End Date Taking? Authorizing Provider  acetaminophen (TYLENOL) 500 MG tablet Take 1,000 mg by mouth 2 (two) times daily.    [provider]  alprazolam Prudy Feeler) 2 MG tablet Take 1 mg by mouth daily as needed for anxiety.     [provider]  amLODipine (NORVASC) 10 MG tablet Take 10 mg by mouth daily.    [provider]  atorvastatin (LIPITOR) 40 MG tablet Take 40 mg by mouth every evening.    [provider]  Cholecalciferol (VITAMIN D) 2000 units CAPS Take 2,000 Units by mouth daily.    [provider]  clopidogrel (PLAVIX) 75 MG tablet Take 1 tablet (75 mg total) by mouth daily. 06/27/16   Marvel Plan, MD  diphenoxylate-atropine (LOMOTIL) 2.5-0.025 MG tablet Take 1 tablet by mouth 4 (four) times daily as needed for diarrhea or loose stools.    [provider]  DULoxetine (CYMBALTA) 60 MG capsule Take 60 mg by mouth daily.    [provider]  glipiZIDE (GLUCOTROL) 5 MG tablet  Take 1 tablet (5 mg total) by mouth 2 (two) times daily before a meal. 04/30/16   Lora Paula, MD  INVOKANA 300 MG TABS tablet Take 300 mg by mouth daily before breakfast.     [provider]  levETIRAcetam (KEPPRA) 500 MG tablet Take 1 tablet (500 mg total) by mouth 2 (two) times daily. 04/30/16   Lora Paula, MD  loratadine (CLARITIN) 10 MG tablet Take 10 mg by mouth daily.    [provider]  losartan (COZAAR) 50 MG tablet Take 50 mg by mouth 2 (two) times daily.    [provider]  metoprolol (LOPRESSOR) 50 MG tablet Take 50 mg by mouth 2 (two) times  daily.     [provider]  omeprazole (PRILOSEC) 40 MG capsule Take 40 mg by mouth daily.    [provider]  oxyCODONE (OXY IR/ROXICODONE) 5 MG immediate release tablet Take 1-2 tablets (5-10 mg total) by mouth every 4 (four) hours as needed for severe pain. 03/11/17   Lyndle Herrlich, MD  topiramate (TOPAMAX) 25 MG tablet 25mg  bid for 7 days and then 50mg  bid Patient taking differently: Take 50 mg by mouth 2 (two) times daily. 25mg  bid for 7 days and then 50mg  bid 01/07/17   Marvel Plan, MD    Allergies  Allergen Reactions  . Aspirin Itching  . Buprenorphine Hcl Itching  . Codeine Itching and Other (See Comments)    Makes her feel like something is crawling under her skin. Can take, sometimes makes her itch  . Compazine Itching    "makes my skin crawl"  . Ioxaglate Itching  . Ivp Dye [Iodinated Diagnostic Agents] Itching    Makes her itch really bad. Had to get 2-3 shots of Benadryl when she was in the hospital.  . Metrizamide Itching  . Morphine And Related Itching    Feels like something is underneath her skin crawling   . Naproxen Itching  . Norco [Hydrocodone-Acetaminophen] Itching  . Penicillin G Hives and Rash    Has patient had a PCN reaction causing immediate rash, facial/tongue/throat swelling, SOB or lightheadedness with hypotension: Yes Has patient had a PCN reaction causing severe rash involving mucus membranes or skin necrosis: No Has patient had a PCN reaction that required hospitalization: No Has patient had a PCN reaction occurring within the last 10 years: No If all of the above answers are "NO", then may proceed with Cephalosporin use.   Marland Kitchen Penicillins Itching and Other (See Comments)    Makes her feel really uncomfortable. No other information available.  . Prochlorperazine Maleate Other (See Comments)    Compazine makes her skin crawl - needs 2-3 shots of Benadryl to get relief.  . Reglan [Metoclopramide] Itching and Other (See Comments)     Makes her skin crawl  . Sulfur Hives and Rash    Other reaction(s): Skin Rashes, Hives  . Tegretol [Carbamazepine] Itching and Other (See Comments)    Makes her feel like something is crawling under her skin.  . Toradol [Ketorolac Tromethamine] Itching  . Tramadol Itching    Family History  Problem Relation Age of Onset  . Heart failure Mother   . Diabetes Father   . Cancer Sister     Social History Social History  Substance Use Topics  . Smoking status: Current Every Day Smoker    Packs/day: 0.25    Years: 1.50    Types: Cigarettes  . Smokeless tobacco: Never Used  . Alcohol use  No     Comment: no alcohol since 2002    Review of Systems Constitutional: Negative for fever Cardiovascular: Negative for chest pain. Respiratory: Negative for shortness of breath. Gastrointestinal: Negative for abdominal pain. Intermittent nausea and vomiting which she relates to the pain medication. Genitourinary: Negative for dysuria. Musculoskeletal: Significant right shoulder pain Neurological: Moderate headache All other ROS negative  ____________________________________________   PHYSICAL EXAM:  VITAL SIGNS: ED Triage Vitals  Enc Vitals Group     BP 03/16/17 1457 (!) 148/99     Pulse Rate 03/16/17 1457 (!) 117     Resp 03/16/17 1457 18     Temp 03/16/17 1457 (!) 97.5 F (36.4 C)     Temp Source 03/16/17 1457 Oral     SpO2 03/16/17 1457 98 %     Weight 03/16/17 1457 154 lb (69.9 kg)     Height 03/16/17 1457 5\' 2"  (1.575 m)     Head Circumference --      Peak Flow --      Pain Score 03/16/17 1456 9     Pain Loc --      Pain Edu? --      Excl. in GC? --     Constitutional: Alert and oriented. Well appearing and in no distress. Eyes: Normal exam ENT   Head: Normocephalic and atraumatic.   Mouth/Throat: Mucous membranes are moist. Cardiovascular: Normal rate, regular rhythm. No murmur Respiratory: Normal respiratory effort without tachypnea nor retractions. Breath  sounds are clear  Gastrointestinal: Soft and nontender. No distention. Musculoskeletal: Well-appearing surgical incision with staples present in the right arm. Arm appears normal with no significant edema, 2+ radial pulse, good grip strength and sensation. No signs of cellulitis or incision infection. Neurologic:  Normal speech and language. No gross focal neurologic deficits  Skin:  Skin is warm, dry and intact.  Psychiatric: Mood and affect are normal.   ____________________________________________    INITIAL IMPRESSION / ASSESSMENT AND PLAN / ED COURSE  Pertinent labs & imaging results that were available during my care of the patient were reviewed by me and considered in my medical decision making (see chart for details).  Patient presents to the emergency department with increased right arm pain since her surgery. Patient's labs are consistent with a urinary tract infection otherwise within normal limits we will dose a one-time dose of fosfomycin and send a urine culture. We will dose IM Dilaudid for the patient and the emergency department. I discussed with patient that we could not discharge her with anything stronger and she would need to follow up with her orthopedist tomorrow for reevaluation. The patient is agreeable to this plan.  ____________________________________________   FINAL CLINICAL IMPRESSION(S) / ED DIAGNOSES  Postoperative pain    Minna AntisPaduchowski, Creek Gan, MD 03/16/17 [phone]

## 2017-03-18 ENCOUNTER — Encounter: Payer: Self-pay | Admitting: Orthopedic Surgery

## 2017-03-18 LAB — URINE CULTURE: Culture: <10000 — AB

## 2017-03-26 IMAGING — CT CT RENAL STONE PROTOCOL
2 of 3 series · 16 of 36 positions shown, 18 images · non-contrast
Comparison: Rodney Merlos CT Abdomen and Pelvis 02/03/2015, and
earlier

CLINICAL DATA: 49-year-old female with bilateral flank pain for 2
weeks. Dysuria. Initial encounter.

EXAM:
CT ABDOMEN AND PELVIS WITHOUT CONTRAST
TECHNIQUE: Multidetector CT imaging of the abdomen and pelvis was performed
following the standard protocol without IV contrast.

[Series 3: coronal · coronal · 0.74mm/px · 3 of 97 slices shown]
[im 33/97  soft-tissue]
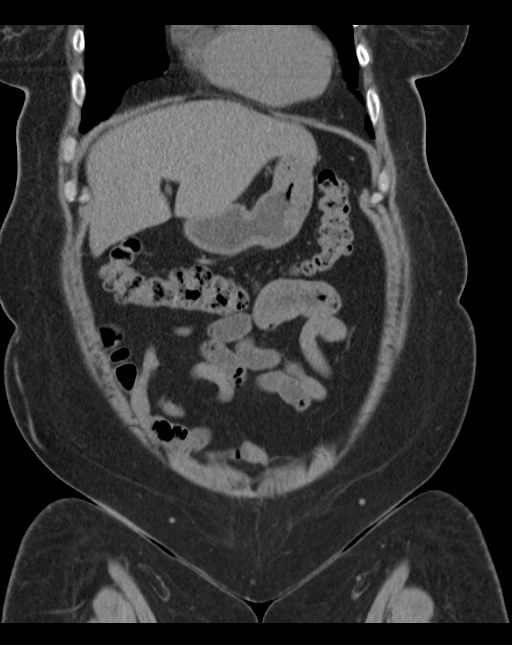
[im 43/97  soft-tissue]
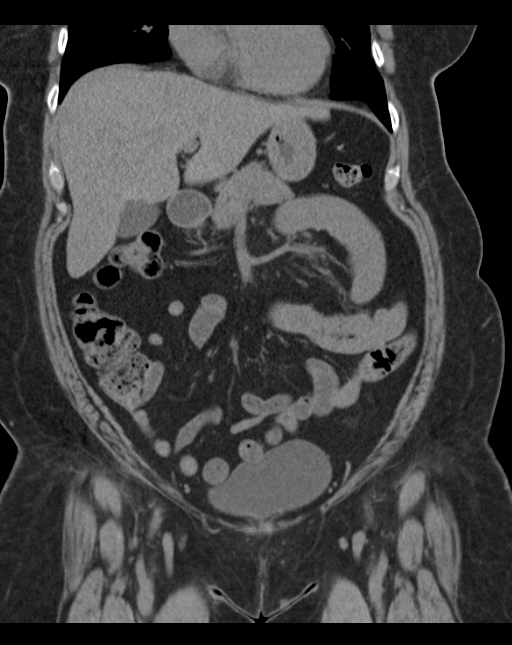
[im 54/97  soft-tissue]
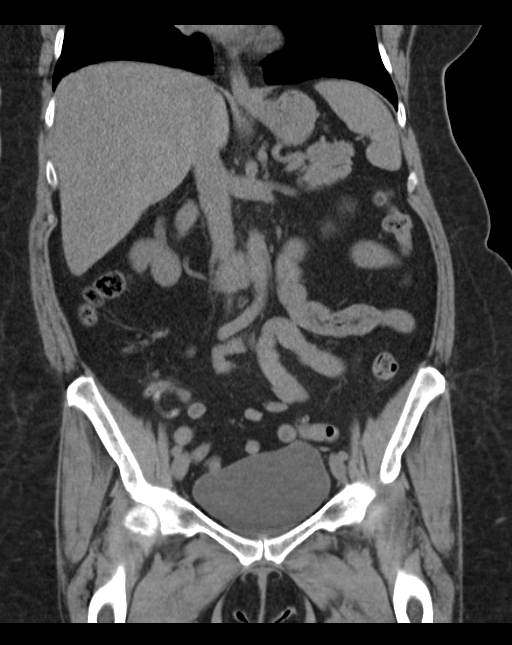

[Series 6: lung · axial · 0.79mm/px · z∈[+1426,+1520]mm · 13 of 22 slices shown, 15 images]
[im 2/22  soft-tissue]
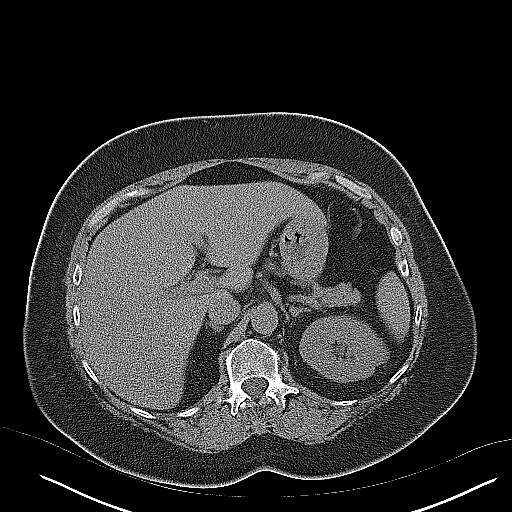
[im 2/22  bone]
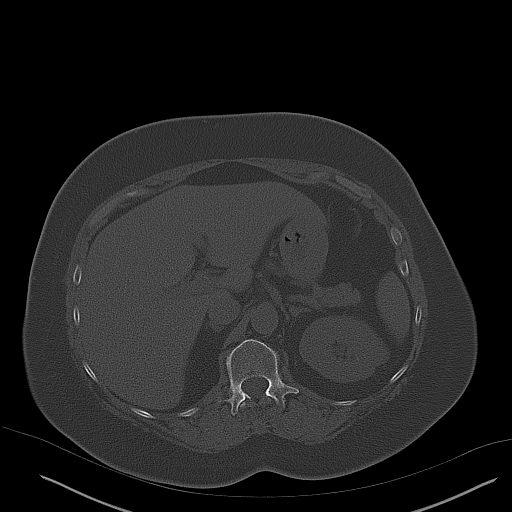
[im 4/22  soft-tissue]
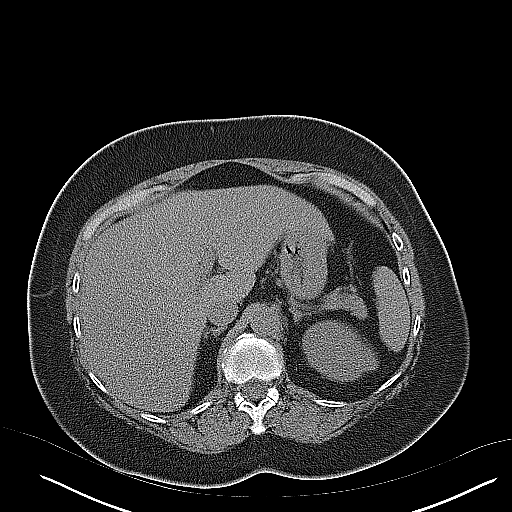
[im 5/22  soft-tissue]
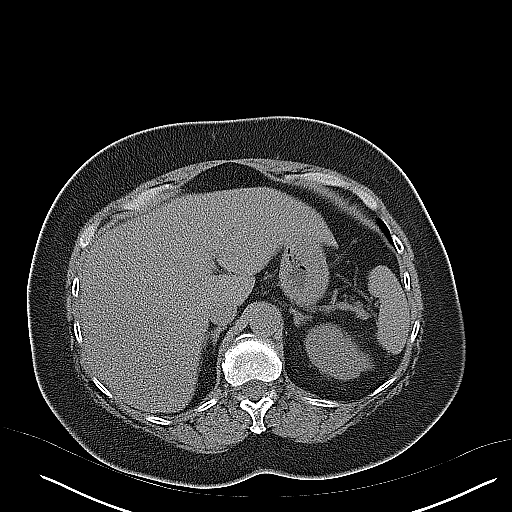
[im 7/22  soft-tissue]
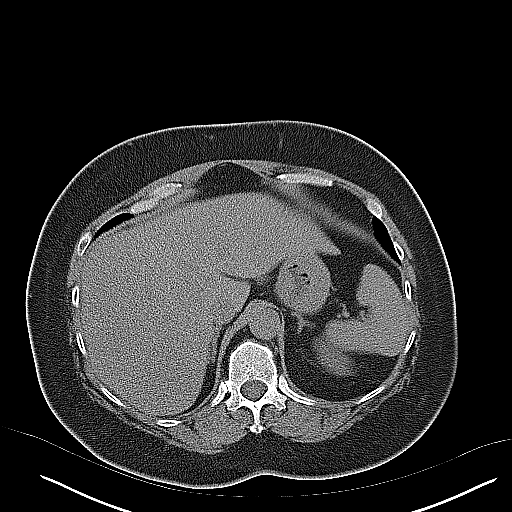
[im 8/22  soft-tissue]
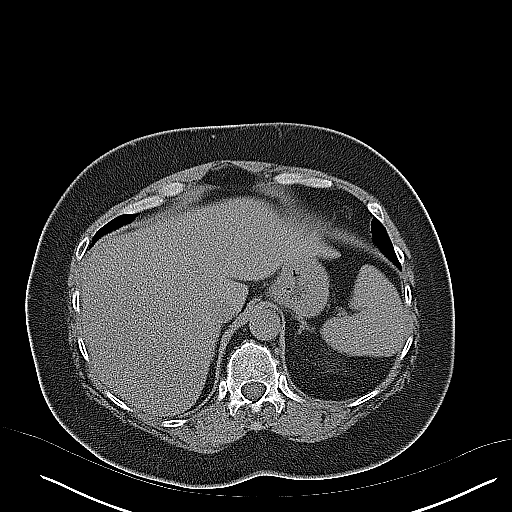
[im 10/22  soft-tissue]
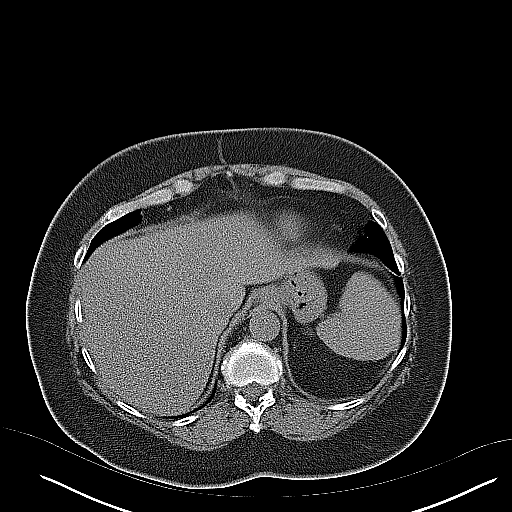
[im 12/22  soft-tissue]
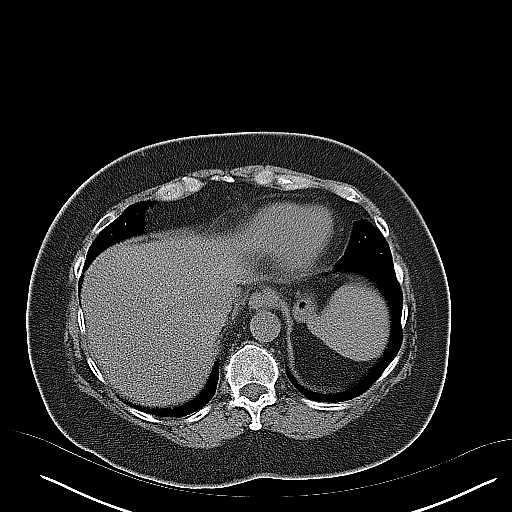
[im 13/22  soft-tissue]
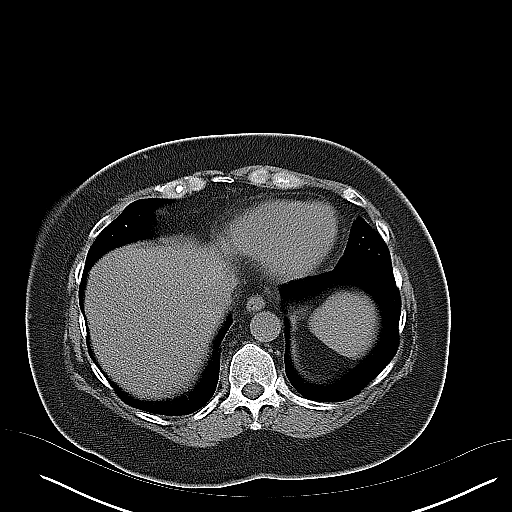
[im 15/22  soft-tissue]
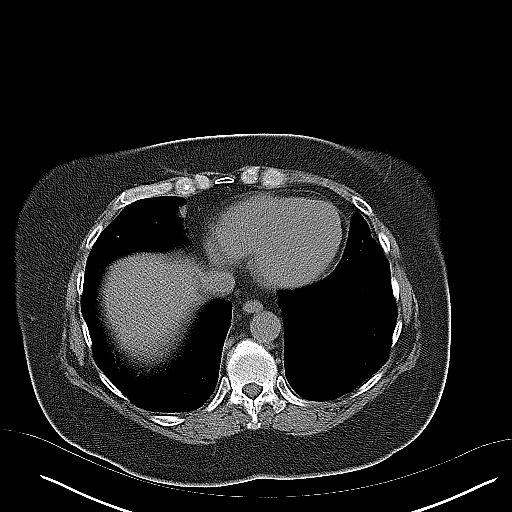
[im 15/22  bone]
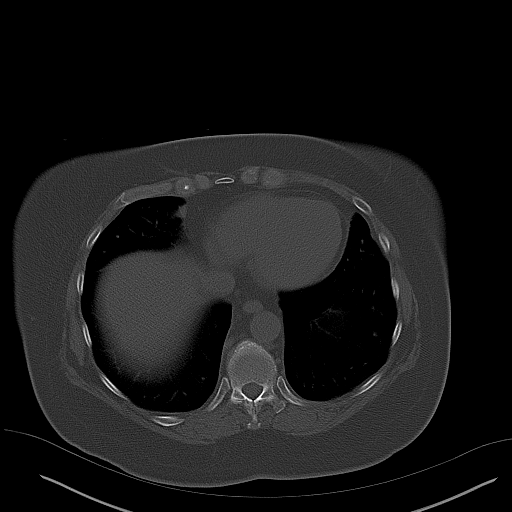
[im 16/22  soft-tissue]
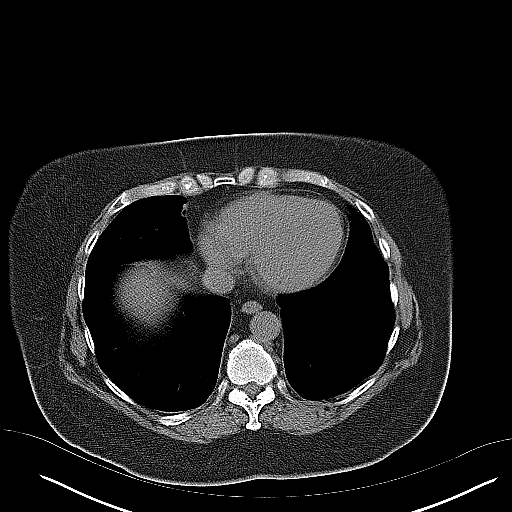
[im 18/22  soft-tissue]
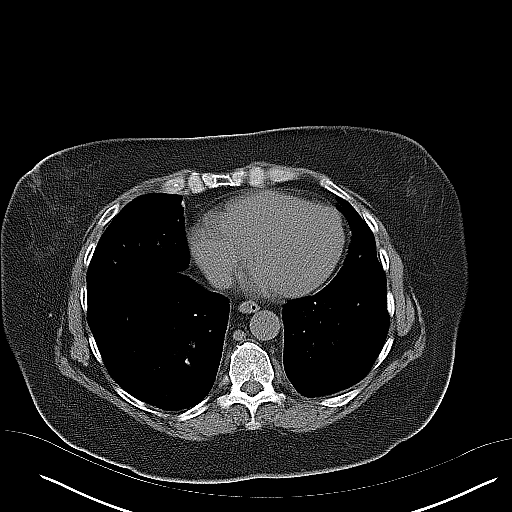
[im 19/22  soft-tissue]
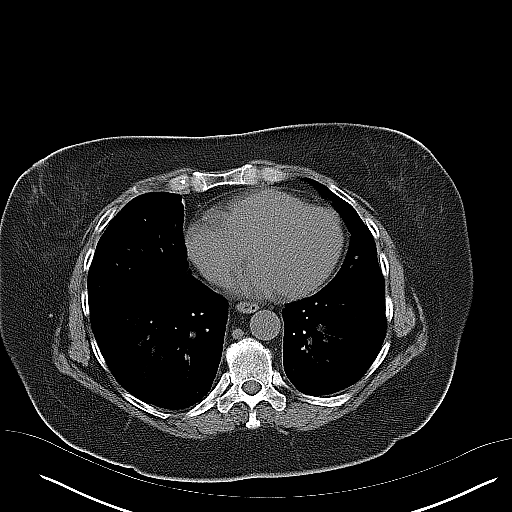
[im 21/22  soft-tissue]
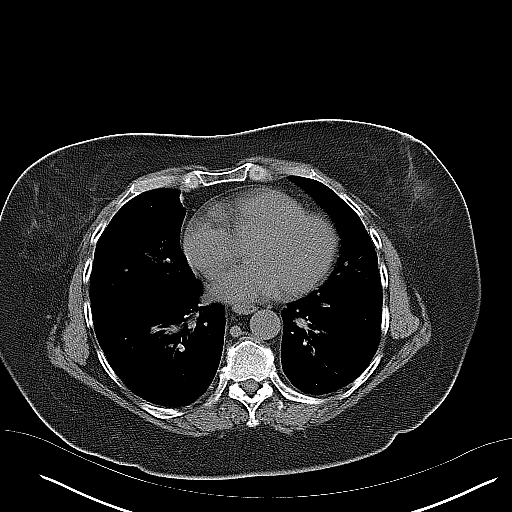

[16 of 36 positions shown; findings below may reference images not displayed]

FINDINGS: Stable, negative lung bases.  No pericardial or pleural effusion.

No acute osseous abnormality identified. Advanced disc and endplate
degeneration at the lumbosacral junction.

No pelvic free fluid. Surgically absent uterus and adnexa. Negative
distal colon.

Sigmoid diverticulosis, no active inflammation. Occasional
diverticula in the left colon. Negative transverse colon, right
colon and appendix. Negative terminal ileum. No dilated small bowel.
Negative noncontrast stomach and duodenum.

Negative noncontrast liver, gallbladder, spleen, pancreas and right
adrenal gland. Stable small chronic left adrenal nodule compatible
with benign adenoma. No abdominal free fluid. Minimal calcified
atherosclerosis at the aortoiliac bifurcation.

Chronic lobulated appearance of the right kidney with cortical
atrophy and/or scarring and nephrocalcinosis/nephrolithiasis. No
right hydronephrosis or perinephric stranding. Stable proximal right
ureter. Negative course of the right ureter. The bladder is
moderately distended but otherwise unremarkable.

Less severe left renal cortical scarring and nephrocalcinosis. No
left hydronephrosis. Negative course of the left ureter. Stable
small pelvic phleboliths.
IMPRESSION: 1. No ureteral calculus or obstructive uropathy. Chronic right
greater than left renal scarring and
nephrolithiasis/nephrocalcinosis.
2. No acute or inflammatory findings in the abdomen or pelvis.

## 2017-03-31 ENCOUNTER — Ambulatory Visit
Payer: No Typology Code available for payment source | Admitting: Student in an Organized Health Care Education/Training Program

## 2017-04-08 ENCOUNTER — Other Ambulatory Visit: Payer: Self-pay | Admitting: Neurology

## 2017-04-08 DIAGNOSIS — I82612 Acute embolism and thrombosis of superficial veins of left upper extremity: Secondary | ICD-10-CM

## 2017-04-08 DIAGNOSIS — I611 Nontraumatic intracerebral hemorrhage in hemisphere, cortical: Secondary | ICD-10-CM

## 2017-04-10 ENCOUNTER — Other Ambulatory Visit: Payer: No Typology Code available for payment source

## 2017-04-11 ENCOUNTER — Emergency Department: Payer: No Typology Code available for payment source

## 2017-04-11 ENCOUNTER — Encounter: Payer: Self-pay | Admitting: *Deleted

## 2017-04-11 ENCOUNTER — Emergency Department
Admission: EM | Admit: 2017-04-11 | Discharge: 2017-04-11 | Disposition: A | Discharge status: Home / Self Care | Payer: No Typology Code available for payment source | Attending: Emergency Medicine | Admitting: Emergency Medicine

## 2017-04-11 DIAGNOSIS — Z8673 Personal history of transient ischemic attack (TIA), and cerebral infarction without residual deficits: Secondary | ICD-10-CM | POA: Diagnosis not present

## 2017-04-11 DIAGNOSIS — Y998 Other external cause status: Secondary | ICD-10-CM | POA: Diagnosis not present

## 2017-04-11 DIAGNOSIS — R112 Nausea with vomiting, unspecified: Secondary | ICD-10-CM | POA: Insufficient documentation

## 2017-04-11 DIAGNOSIS — Z7902 Long term (current) use of antithrombotics/antiplatelets: Secondary | ICD-10-CM | POA: Insufficient documentation

## 2017-04-11 DIAGNOSIS — R197 Diarrhea, unspecified: Secondary | ICD-10-CM

## 2017-04-11 DIAGNOSIS — R1010 Upper abdominal pain, unspecified: Secondary | ICD-10-CM | POA: Diagnosis not present

## 2017-04-11 DIAGNOSIS — I10 Essential (primary) hypertension: Secondary | ICD-10-CM | POA: Diagnosis not present

## 2017-04-11 DIAGNOSIS — F17210 Nicotine dependence, cigarettes, uncomplicated: Secondary | ICD-10-CM | POA: Diagnosis not present

## 2017-04-11 DIAGNOSIS — Z7984 Long term (current) use of oral hypoglycemic drugs: Secondary | ICD-10-CM | POA: Diagnosis not present

## 2017-04-11 DIAGNOSIS — Y9389 Activity, other specified: Secondary | ICD-10-CM | POA: Diagnosis not present

## 2017-04-11 DIAGNOSIS — Y92238 Other place in hospital as the place of occurrence of the external cause: Secondary | ICD-10-CM | POA: Diagnosis not present

## 2017-04-11 DIAGNOSIS — S56911A Strain of unspecified muscles, fascia and tendons at forearm level, right arm, initial encounter: Secondary | ICD-10-CM | POA: Insufficient documentation

## 2017-04-11 DIAGNOSIS — X509XXA Other and unspecified overexertion or strenuous movements or postures, initial encounter: Secondary | ICD-10-CM | POA: Insufficient documentation

## 2017-04-11 DIAGNOSIS — R1011 Right upper quadrant pain: Secondary | ICD-10-CM | POA: Diagnosis not present

## 2017-04-11 DIAGNOSIS — E119 Type 2 diabetes mellitus without complications: Secondary | ICD-10-CM | POA: Insufficient documentation

## 2017-04-11 DIAGNOSIS — T148XXA Other injury of unspecified body region, initial encounter: Secondary | ICD-10-CM

## 2017-04-11 LAB — COMPREHENSIVE METABOLIC PANEL
ALBUMIN: 4.3 g/dL (ref 3.5–5.0)
ALT: 14 U/L (ref 14–54)
AST: 21 U/L (ref 15–41)
Alkaline Phosphatase: 131 U/L — ABNORMAL HIGH (ref 38–126)
Anion gap: 10 (ref 5–15)
BUN: 11 mg/dL (ref 6–20)
CHLORIDE: 109 mmol/L (ref 101–111)
CO2: 22 mmol/L (ref 22–32)
Calcium: 10.2 mg/dL (ref 8.9–10.3)
Creatinine, Ser: 0.86 mg/dL (ref 0.44–1.00)
GFR calc Af Amer: >60 mL/min (ref >60)
GFR calc non Af Amer: >60 mL/min (ref >60)
GLUCOSE: 85 mg/dL (ref 65–99)
POTASSIUM: 3.6 mmol/L (ref 3.5–5.1)
Sodium: 141 mmol/L (ref 135–145)
Total Bilirubin: 0.6 mg/dL (ref 0.3–1.2)
Total Protein: 8.2 g/dL — ABNORMAL HIGH (ref 6.5–8.1)

## 2017-04-11 LAB — LIPASE, BLOOD: Lipase: 28 U/L (ref 11–51)

## 2017-04-11 LAB — URINALYSIS, COMPLETE (UACMP) WITH MICROSCOPIC
BACTERIA UA: NONE SEEN
Bilirubin Urine: NEGATIVE
Glucose, UA: >=500 mg/dL — AB
Hgb urine dipstick: NEGATIVE
KETONES UR: NEGATIVE mg/dL
Nitrite: NEGATIVE
Protein, ur: NEGATIVE mg/dL
Specific Gravity, Urine: 1.006 (ref 1.005–1.030)
pH: 6 (ref 5.0–8.0)

## 2017-04-11 LAB — CBC
HEMATOCRIT: 45.8 % (ref 35.0–47.0)
Hemoglobin: 15.2 g/dL (ref 12.0–16.0)
MCH: 30.6 pg (ref 26.0–34.0)
MCHC: 33.1 g/dL (ref 32.0–36.0)
MCV: 92.3 fL (ref 80.0–100.0)
Platelets: 239 10*3/uL (ref 150–440)
RBC: 4.96 MIL/uL (ref 3.80–5.20)
RDW: 13.5 % (ref 11.5–14.5)
WBC: 9.1 10*3/uL (ref 3.6–11.0)

## 2017-04-11 MED ORDER — OXYCODONE HCL 5 MG PO TABS
5.0000 mg | ORAL_TABLET | Freq: Once | ORAL | Status: AC
Start: 2017-04-11 — End: 2017-04-11
  Administered 2017-04-11: 5 mg via ORAL
  Filled 2017-04-11: qty 1

## 2017-04-11 MED ORDER — ONDANSETRON HCL 4 MG PO TABS
4.0000 mg | ORAL_TABLET | Freq: Once | ORAL | Status: AC
  Administered 2017-04-11: 4 mg via ORAL
  Filled 2017-04-11: qty 1

## 2017-04-11 MED ORDER — ONDANSETRON HCL 4 MG PO TABS: 4.0000 mg | ORAL_TABLET | Freq: Every day | ORAL | 0 refills | Status: DC | PRN

## 2017-04-11 MED ORDER — CARISOPRODOL 350 MG PO TABS: 350.0000 mg | ORAL_TABLET | Freq: Three times a day (TID) | ORAL | 0 refills | Status: DC | PRN

## 2017-04-11 NOTE — ED Notes (Signed)
Patient going to US

## 2017-04-11 NOTE — ED Provider Notes (Signed)
[facility] Emergency Department Provider Note  ____________________________________________   First MD Initiated Contact with Patient 04/11/17 Paulo Fruit     (approximate)  I have reviewed the triage vital signs and the nursing notes.   HISTORY  Chief Complaint Diarrhea   HPI Stephanie [name] is a 51 y.o. female with a history of diverticulitis as well as irritable bowel syndrome who is presenting to the emergency department with 3 days of diarrhea, nausea and vomiting. She also states that she has upper abdominal cramping which she rates at about a 6 out of 10 right now. She says that the stool was initially black-colored liquid but progressed to an oatmeal color and consistency and now over the past day it has been formed but soft. She said that she had 6 episodes of stooling today which is down from 20 at its peak. Also, the patient says that she has not vomited since yesterday and also that she tolerates bland crackers with mayonnaise on them.   Past Medical History:  Diagnosis Date  . Diabetes mellitus without complication (HCC)   . Diverticulitis   . Headache   . Hepatitis C   . History of kidney stones   . Hypertension   . IBS (irritable bowel syndrome)   . Kidney stones   . Sciatica   . Sleep apnea   . Stroke [facility])     Patient Active Problem List   Diagnosis Date Noted  . Headache 01/07/2017  . Small bowel obstruction (HCC) 09/02/2016  . Pain and swelling of left upper extremity 07/22/2016  . Superficial venous thrombosis of arm, left 07/22/2016  . ICH (intracerebral hemorrhage) (HCC)   . Essential hypertension, malignant 04/19/2016  . Cytotoxic brain edema (HCC) 04/19/2016  . IVH (intraventricular hemorrhage) (HCC) 04/18/2016  . Hepatitis C     Past Surgical History:  Procedure Laterality Date  . ABDOMINAL HYSTERECTOMY    . carpel tunnel syndrome  2017  . HUMERUS IM NAIL Right 03/11/2017   Procedure: INTRAMEDULLARY (IM) NAIL HUMERAL;   Surgeon: Lyndle Herrlich, MD;  Location: [facility] ORS;  Service: Orthopedics;  Laterality: Right;  . KIDNEY STONE SURGERY Right 02/12/12  . KIDNEY STONE SURGERY Left 07/15/2012  . LIGATION OF ARTERIOVENOUS  FISTULA Left 06/21/2016   Procedure: LIGATION OF ARTERIOVENOUS  FISTULA ( LIGATION BASILIC VEIN );  Surgeon: Renford Dills, MD;  Location: [facility] ORS;  Service: Vascular;  Laterality: Left;  . LITHOTRIPSY    . OOPHORECTOMY    . SINUS EXPLORATION    . TONSILLECTOMY      Prior to Admission medications   Medication Sig Start Date End Date Taking? Authorizing Provider  acetaminophen (TYLENOL) 500 MG tablet Take 1,000 mg by mouth 2 (two) times daily.    [provider]  alprazolam Prudy Feeler) 2 MG tablet Take 1 mg by mouth daily as needed for anxiety.     [provider]  amLODipine (NORVASC) 10 MG tablet Take 10 mg by mouth daily.    [provider]  atorvastatin (LIPITOR) 40 MG tablet Take 40 mg by mouth every evening.    [provider]  Cholecalciferol (VITAMIN D) 2000 units CAPS Take 2,000 Units by mouth daily.    [provider]  clopidogrel (PLAVIX) 75 MG tablet Take 1 tablet (75 mg total) by mouth daily. 06/27/16   Marvel Plan, MD  diphenoxylate-atropine (LOMOTIL) 2.5-0.025 MG tablet Take 1 tablet by mouth 4 (four) times daily as needed for diarrhea or loose stools.  [provider]  DULoxetine (CYMBALTA) 60 MG capsule Take 60 mg by mouth daily.    [provider]  glipiZIDE (GLUCOTROL) 5 MG tablet Take 1 tablet (5 mg total) by mouth 2 (two) times daily before a meal. 04/30/16   Lora PaulaKrall, Jennifer T, MD  INVOKANA 300 MG TABS tablet Take 300 mg by mouth daily before breakfast.     [provider]  levETIRAcetam (KEPPRA) 500 MG tablet Take 1 tablet (500 mg total) by mouth 2 (two) times daily. 04/30/16   Lora PaulaKrall, Jennifer T, MD  loratadine (CLARITIN) 10 MG tablet Take 10 mg by mouth daily.    [provider]  losartan  (COZAAR) 50 MG tablet Take 50 mg by mouth 2 (two) times daily.    [provider]  metoprolol (LOPRESSOR) 50 MG tablet Take 50 mg by mouth 2 (two) times daily.     [provider]  omeprazole (PRILOSEC) 40 MG capsule Take 40 mg by mouth daily.    [provider]  oxyCODONE (OXY IR/ROXICODONE) 5 MG immediate release tablet Take 1-2 tablets (5-10 mg total) by mouth every 4 (four) hours as needed for severe pain. 03/11/17   Lyndle HerrlichBowers, James R, MD  topiramate (TOPAMAX) 25 MG tablet 25mg  bid for 7 days and then 50mg  bid Patient taking differently: Take 50 mg by mouth 2 (two) times daily. 25mg  bid for 7 days and then 50mg  bid 01/07/17   Marvel PlanXu, Jindong, MD    Allergies Aspirin; Buprenorphine hcl; Codeine; Compazine; Ioxaglate; Ivp dye [iodinated diagnostic agents]; Metrizamide; Morphine and related; Naproxen; Norco [hydrocodone-acetaminophen]; Penicillin g; Penicillins; Prochlorperazine maleate; Reglan [metoclopramide]; Sulfur; Tegretol [carbamazepine]; Toradol [ketorolac tromethamine]; and Tramadol  Family History  Problem Relation Age of Onset  . Heart failure Mother   . Diabetes Father   . Cancer Sister     Social History Social History  Substance Use Topics  . Smoking status: Current Every Day Smoker    Packs/day: 0.25    Years: 1.50    Types: Cigarettes  . Smokeless tobacco: Never Used  . Alcohol use No     Comment: no alcohol since 2002    Review of Systems  Constitutional: No fever/chills Eyes: No visual changes. ENT: No sore throat. Cardiovascular: Denies chest pain. Respiratory: Denies shortness of breath. Gastrointestinal:   No constipation. Genitourinary: Negative for dysuria. Musculoskeletal: Negative for back pain. Skin: Negative for rash. Neurological: Negative for headaches, focal weakness or numbness.   ____________________________________________   PHYSICAL EXAM:  VITAL SIGNS: ED Triage Vitals  Enc Vitals Group     BP 04/11/17 1646 (!)  163/95     Pulse Rate 04/11/17 1646 82     Resp 04/11/17 1646 16     Temp 04/11/17 1646 97.8 F (36.6 C)     Temp Source 04/11/17 1646 Oral     SpO2 04/11/17 1646 98 %     Weight 04/11/17 1646 154 lb (69.9 kg)     Height 04/11/17 1646 5\' 2"  (1.575 m)     Head Circumference --      Peak Flow --      Pain Score 04/11/17 1645 9     Pain Loc --      Pain Edu? --      Excl. in GC? --     Constitutional: Alert and oriented. Well appearing and in no acute distress. Eyes: Conjunctivae are normal.  Head: Atraumatic. Nose: No congestion/rhinnorhea. Mouth/Throat: Mucous membranes are moist.  Neck: No stridor.   Cardiovascular:  Normal rate, regular rhythm. Grossly normal heart sounds.   Respiratory: Normal respiratory effort.  No retractions. Lungs CTAB. Gastrointestinal: Soft With mild upper abdominal tenderness to palpation with a negative Murphy sign. No distention. No CVA tenderness. Musculoskeletal: No lower extremity tenderness nor edema.  No joint effusions. Neurologic:  Normal speech and language. No gross focal neurologic deficits are appreciated. Skin:  Skin is warm, dry and intact. No rash noted. Psychiatric: Mood and affect are normal. Speech and behavior are normal.  ____________________________________________   LABS (all labs ordered are listed, but only abnormal results are displayed)  Labs Reviewed  COMPREHENSIVE METABOLIC PANEL - Abnormal; Notable for the following:       Result Value   Total Protein 8.2 (*)    Alkaline Phosphatase 131 (*)    All other components within normal limits  URINALYSIS, COMPLETE (UACMP) WITH MICROSCOPIC - Abnormal; Notable for the following:    Color, Urine STRAW (*)    APPearance CLEAR (*)    Glucose, UA >=500 (*)    Leukocytes, UA SMALL (*)    Squamous Epithelial / LPF 0-5 (*)    All other components within normal limits  LIPASE, BLOOD  CBC    ____________________________________________  EKG   ____________________________________________  RADIOLOGY  Or some right upper quadrant with hepatic steatosis but without any other acute pathology. Chest x-ray without any acute pathology. ____________________________________________   PROCEDURES  Procedure(s) performed:   Procedures  Critical Care performed:   ____________________________________________   INITIAL IMPRESSION / ASSESSMENT AND PLAN / ED COURSE  Pertinent labs & imaging results that were available during my care of the patient were reviewed by me and considered in my medical decision making (see chart for details).  ----------------------------------------- 8:53 PM on 04/11/2017 -----------------------------------------  Patient without any abdominal pain at this time. I reexamined her abdomen and she is soft and nontender throughout.  Patient is saying that when she was in x-ray needed to reach overhead that she pulled her right biceps muscle. She has had a recent surgery on the side and denies any pain to the right proximal humerus. I examined the area and she is sensate to light touch. There are no deformities or bruising noted. She is requesting a muscle relaxer. In addition to Zofran I will give her Tresa GarterSoma to go home with. The patient says that she already has Bentyl at home for abdominal cramping. She'll be following up with her primary care doctor. To send urine culture. Symptoms are improving as far as her diarrhea goes and the patient didn't tolerate a simple diet. I believe the Zofran she will do better as far as her vomiting does appear she has not any vomiting in the emergency department and she was able to tolerate her by mouth medications. The patient is understanding of the plan and willing to comply. We reviewed her testing results prior to discharge.      ____________________________________________   FINAL CLINICAL IMPRESSION(S) / ED  DIAGNOSES  Final diagnoses:  RUQ pain   Upper abdominal pain. Nausea vomiting and diarrhea. Muscle strain.   NEW MEDICATIONS STARTED DURING THIS VISIT:  New Prescriptions   No medications on file     Note:  This document was prepared using Dragon voice recognition software and may include unintentional dictation errors.     Myrna BlazerSchaevitz, Odilon Cass Matthew, MD 04/11/17 2055

## 2017-04-11 NOTE — ED Triage Notes (Addendum)
Pt arrives with complaints of diarrhea and and vomiting for 2 weeks, states general weakness and increased falls and decreased PO intake, awake and alert

## 2017-04-13 LAB — URINE CULTURE: Culture: NO GROWTH

## 2017-04-14 ENCOUNTER — Ambulatory Visit: Payer: PRIVATE HEALTH INSURANCE | Admitting: Neurology

## 2017-04-15 ENCOUNTER — Ambulatory Visit
Payer: No Typology Code available for payment source | Admitting: Student in an Organized Health Care Education/Training Program

## 2017-04-18 ENCOUNTER — Emergency Department
Admission: EM | Admit: 2017-04-18 | Discharge: 2017-04-18 | Disposition: A | Discharge status: Home / Self Care | Payer: No Typology Code available for payment source | Attending: Emergency Medicine | Admitting: Emergency Medicine

## 2017-04-18 ENCOUNTER — Emergency Department: Payer: No Typology Code available for payment source

## 2017-04-18 DIAGNOSIS — Z79899 Other long term (current) drug therapy: Secondary | ICD-10-CM | POA: Diagnosis not present

## 2017-04-18 DIAGNOSIS — Z7984 Long term (current) use of oral hypoglycemic drugs: Secondary | ICD-10-CM | POA: Diagnosis not present

## 2017-04-18 DIAGNOSIS — Y939 Activity, unspecified: Secondary | ICD-10-CM | POA: Diagnosis not present

## 2017-04-18 DIAGNOSIS — Z7902 Long term (current) use of antithrombotics/antiplatelets: Secondary | ICD-10-CM | POA: Insufficient documentation

## 2017-04-18 DIAGNOSIS — M25511 Pain in right shoulder: Secondary | ICD-10-CM | POA: Diagnosis not present

## 2017-04-18 DIAGNOSIS — Y999 Unspecified external cause status: Secondary | ICD-10-CM | POA: Insufficient documentation

## 2017-04-18 DIAGNOSIS — Y929 Unspecified place or not applicable: Secondary | ICD-10-CM | POA: Diagnosis not present

## 2017-04-18 DIAGNOSIS — S4991XA Unspecified injury of right shoulder and upper arm, initial encounter: Secondary | ICD-10-CM | POA: Diagnosis present

## 2017-04-18 DIAGNOSIS — E119 Type 2 diabetes mellitus without complications: Secondary | ICD-10-CM | POA: Insufficient documentation

## 2017-04-18 DIAGNOSIS — I10 Essential (primary) hypertension: Secondary | ICD-10-CM | POA: Insufficient documentation

## 2017-04-18 DIAGNOSIS — S42201P Unspecified fracture of upper end of right humerus, subsequent encounter for fracture with malunion: Secondary | ICD-10-CM | POA: Insufficient documentation

## 2017-04-18 DIAGNOSIS — X503XXA Overexertion from repetitive movements, initial encounter: Secondary | ICD-10-CM | POA: Diagnosis not present

## 2017-04-18 DIAGNOSIS — G8929 Other chronic pain: Secondary | ICD-10-CM

## 2017-04-18 DIAGNOSIS — R52 Pain, unspecified: Secondary | ICD-10-CM

## 2017-04-18 DIAGNOSIS — F17210 Nicotine dependence, cigarettes, uncomplicated: Secondary | ICD-10-CM | POA: Insufficient documentation

## 2017-04-18 LAB — COMPREHENSIVE METABOLIC PANEL
ALT: 17 U/L (ref 14–54)
AST: 21 U/L (ref 15–41)
Albumin: 4.7 g/dL (ref 3.5–5.0)
Alkaline Phosphatase: 110 U/L (ref 38–126)
Anion gap: 13 (ref 5–15)
BUN: 14 mg/dL (ref 6–20)
CHLORIDE: 104 mmol/L (ref 101–111)
CO2: 20 mmol/L — AB (ref 22–32)
Calcium: 10.1 mg/dL (ref 8.9–10.3)
Creatinine, Ser: 0.69 mg/dL (ref 0.44–1.00)
Glucose, Bld: 126 mg/dL — ABNORMAL HIGH (ref 65–99)
POTASSIUM: 3.2 mmol/L — AB (ref 3.5–5.1)
SODIUM: 137 mmol/L (ref 135–145)
Total Bilirubin: 0.9 mg/dL (ref 0.3–1.2)
Total Protein: 8.2 g/dL — ABNORMAL HIGH (ref 6.5–8.1)

## 2017-04-18 LAB — CBC WITH DIFFERENTIAL/PLATELET
Basophils Absolute: 0.1 10*3/uL (ref 0–0.1)
Basophils Relative: 1 %
EOS ABS: 0 10*3/uL (ref 0–0.7)
EOS PCT: 0 %
HCT: 45.6 % (ref 35.0–47.0)
Hemoglobin: 15.4 g/dL (ref 12.0–16.0)
LYMPHS ABS: 3.7 10*3/uL — AB (ref 1.0–3.6)
Lymphocytes Relative: 38 %
MCH: 30.6 pg (ref 26.0–34.0)
MCHC: 33.9 g/dL (ref 32.0–36.0)
MCV: 90.5 fL (ref 80.0–100.0)
Monocytes Absolute: 1.1 10*3/uL — ABNORMAL HIGH (ref 0.2–0.9)
Monocytes Relative: 11 %
Neutro Abs: 5 10*3/uL (ref 1.4–6.5)
Neutrophils Relative %: 50 %
PLATELETS: 292 10*3/uL (ref 150–440)
RBC: 5.04 MIL/uL (ref 3.80–5.20)
RDW: 14.6 % — ABNORMAL HIGH (ref 11.5–14.5)
WBC: 9.9 10*3/uL (ref 3.6–11.0)

## 2017-04-18 LAB — ACETAMINOPHEN LEVEL: Acetaminophen (Tylenol), Serum: 27 ug/mL (ref 10–30)

## 2017-04-18 MED ORDER — DIAZEPAM 5 MG PO TABS
5.0000 mg | ORAL_TABLET | Freq: Once | ORAL | Status: AC
  Administered 2017-04-18: 5 mg via ORAL
  Filled 2017-04-18: qty 1

## 2017-04-18 MED ORDER — ACETAMINOPHEN 500 MG PO TABS
1000.0000 mg | ORAL_TABLET | Freq: Once | ORAL | Status: AC
  Administered 2017-04-18: 1000 mg via ORAL
  Filled 2017-04-18: qty 2

## 2017-04-18 NOTE — ED Provider Notes (Signed)
[facility] Emergency Department Provider Note  ____________________________________________  Time seen: Approximately 8:17 PM  I have reviewed the triage vital signs and the nursing notes.   HISTORY  Chief Complaint Shoulder Pain    HPI Stephanie [name] is a 51 y.o. female who complains of chronic right shoulder pain worse with movement ever since having surgery at the beginning of July to repair a humerus fracture. She's been taking Tylenol 2000 mg once a day for this which helps temporarily but then wears off. She reports that her orthopedic doctor has dismissed her from that clinic and she is currently trying to get an appointment with another doctor. Pain is worse with movement, no alleviating factors, severe. No new trauma. No fevers or chills. No chest pain or shortness of breath. Pain is nonradiating and sharp in the right shoulder.  Patient admits that after surgery she went back to her orthopedic clinic one time for rehabilitation exercises but has not done any rehabilitation or range of motion exercises other than that. She attributes this to the pain associated with doing the exercises and wanting to avoid that.   She has a history of opioid abuse and dependence.  Review of control substance reporting systems shows that in July she was receiving opioid prescriptions from 2 different doctors for a total of 120 oxycodone tablets prescribed within 1 week. She's continued going back to her orthopedic doctor and getting subsequent opioid prescriptions July 30.   Past Medical History:  Diagnosis Date  . Diabetes mellitus without complication (HCC)   . Diverticulitis   . Headache   . Hepatitis C   . History of kidney stones   . Hypertension   . IBS (irritable bowel syndrome)   . Kidney stones   . Sciatica   . Sleep apnea   . Stroke [facility])      Patient Active Problem List   Diagnosis Date Noted  . Headache 01/07/2017  . Small bowel obstruction  (HCC) 09/02/2016  . Pain and swelling of left upper extremity 07/22/2016  . Superficial venous thrombosis of arm, left 07/22/2016  . ICH (intracerebral hemorrhage) (HCC)   . Essential hypertension, malignant 04/19/2016  . Cytotoxic brain edema (HCC) 04/19/2016  . IVH (intraventricular hemorrhage) (HCC) 04/18/2016  . Hepatitis C      Past Surgical History:  Procedure Laterality Date  . ABDOMINAL HYSTERECTOMY    . carpel tunnel syndrome  2017  . HUMERUS IM NAIL Right 03/11/2017   Procedure: INTRAMEDULLARY (IM) NAIL HUMERAL;  Surgeon: Lyndle Herrlich, MD;  Location: [facility] ORS;  Service: Orthopedics;  Laterality: Right;  . KIDNEY STONE SURGERY Right 02/12/12  . KIDNEY STONE SURGERY Left 07/15/2012  . LIGATION OF ARTERIOVENOUS  FISTULA Left 06/21/2016   Procedure: LIGATION OF ARTERIOVENOUS  FISTULA ( LIGATION BASILIC VEIN );  Surgeon: Renford Dills, MD;  Location: [facility] ORS;  Service: Vascular;  Laterality: Left;  . LITHOTRIPSY    . OOPHORECTOMY    . SINUS EXPLORATION    . TONSILLECTOMY       Prior to Admission medications   Medication Sig Start Date End Date Taking? Authorizing Provider  acetaminophen (TYLENOL) 500 MG tablet Take 1,000 mg by mouth 2 (two) times daily.    [provider]  alprazolam Prudy Feeler) 2 MG tablet Take 1 mg by mouth daily as needed for anxiety.     [provider]  amLODipine (NORVASC) 10 MG tablet Take 10 mg by mouth daily.    [provider]  atorvastatin (LIPITOR) 40 MG tablet Take 40 mg by mouth every evening.    [provider]  carisoprodol (SOMA) 350 MG tablet Take 1 tablet (350 mg total) by mouth 3 (three) times daily as needed. 04/11/17   Myrna Blazer, MD  Cholecalciferol (VITAMIN D) 2000 units CAPS Take 2,000 Units by mouth daily.    [provider]  clopidogrel (PLAVIX) 75 MG tablet Take 1 tablet (75 mg total) by mouth daily. 06/27/16   Marvel Plan, MD  diphenoxylate-atropine (LOMOTIL) 2.5-0.025 MG  tablet Take 1 tablet by mouth 4 (four) times daily as needed for diarrhea or loose stools.    [provider]  DULoxetine (CYMBALTA) 60 MG capsule Take 60 mg by mouth daily.    [provider]  glipiZIDE (GLUCOTROL) 5 MG tablet Take 1 tablet (5 mg total) by mouth 2 (two) times daily before a meal. 04/30/16   Lora Paula, MD  INVOKANA 300 MG TABS tablet Take 300 mg by mouth daily before breakfast.     [provider]  levETIRAcetam (KEPPRA) 500 MG tablet Take 1 tablet (500 mg total) by mouth 2 (two) times daily. 04/30/16   Lora Paula, MD  loratadine (CLARITIN) 10 MG tablet Take 10 mg by mouth daily.    [provider]  losartan (COZAAR) 50 MG tablet Take 50 mg by mouth 2 (two) times daily.    [provider]  metoprolol (LOPRESSOR) 50 MG tablet Take 50 mg by mouth 2 (two) times daily.     [provider]  omeprazole (PRILOSEC) 40 MG capsule Take 40 mg by mouth daily.    [provider]  ondansetron (ZOFRAN) 4 MG tablet Take 1 tablet (4 mg total) by mouth daily as needed. 04/11/17   Schaevitz, Myra Rude, MD  oxyCODONE (OXY IR/ROXICODONE) 5 MG immediate release tablet Take 1-2 tablets (5-10 mg total) by mouth every 4 (four) hours as needed for severe pain. 03/11/17   Lyndle Herrlich, MD  topiramate (TOPAMAX) 25 MG tablet 25mg  bid for 7 days and then 50mg  bid Patient taking differently: Take 50 mg by mouth 2 (two) times daily. 25mg  bid for 7 days and then 50mg  bid 01/07/17   Marvel Plan, MD     Allergies Aspirin; Buprenorphine hcl; Codeine; Compazine; Ioxaglate; Ivp dye [iodinated diagnostic agents]; Metrizamide; Morphine and related; Naproxen; Norco [hydrocodone-acetaminophen]; Penicillin g; Penicillins; Prochlorperazine maleate; Reglan [metoclopramide]; Sulfur; Tegretol [carbamazepine]; Toradol [ketorolac tromethamine]; and Tramadol   Family History  Problem Relation Age of Onset  . Heart failure Mother   . Diabetes Father    . Cancer Sister     Social History Social History  Substance Use Topics  . Smoking status: Current Every Day Smoker    Packs/day: 0.25    Years: 1.50    Types: Cigarettes  . Smokeless tobacco: Never Used  . Alcohol use No     Comment: no alcohol since 2002    Review of Systems  Constitutional:   No fever or chills.  ENT:   No sore throat. No rhinorrhea. Cardiovascular:   No chest pain or syncope. Respiratory:   No dyspnea or cough. Gastrointestinal:   Negative for abdominal pain, vomiting and diarrhea.  Musculoskeletal:   Chronic right shoulder pain as above All other systems reviewed and are negative except as documented above in ROS and HPI.  ____________________________________________   PHYSICAL EXAM:  VITAL SIGNS: ED Triage Vitals [04/18/17 1558]  Enc Vitals Group  BP (!) 130/108     Pulse Rate (!) 109     Resp (!) 26     Temp 98.5 F (36.9 C)     Temp Source Oral     SpO2 98 %     Weight 178 lb (80.7 kg)     Height 5\' 2"  (1.575 m)     Head Circumference      Peak Flow      Pain Score 9     Pain Loc      Pain Edu?      Excl. in GC?     Vital signs reviewed, nursing assessments reviewed.   Constitutional:   Alert and oriented. Well appearing and in no distress. Eyes:   No scleral icterus.  EOMI. No nystagmus. No conjunctival pallor. PERRL. ENT   Head:   Normocephalic and atraumatic.   Nose:   No congestion/rhinnorhea.    Mouth/Throat:   MMM, no pharyngeal erythema. No peritonsillar mass.    Neck:   No meningismus. Full ROM Hematological/Lymphatic/Immunilogical:   No cervical lymphadenopathy. Cardiovascular:   RRR. Symmetric bilateral radial and DP pulses.  No murmurs.  Respiratory:   Normal respiratory effort without tachypnea/retractions. Breath sounds are clear and equal bilaterally. No wheezes/rales/rhonchi. Gastrointestinal:   Soft and nontender. Non distended. There is no CVA tenderness.  No rebound, rigidity, or  guarding. Genitourinary:   deferred Musculoskeletal:   Patient represents diffuse tenderness around the right shoulder including the clavicle scapula and entire length of the right humerus. This is inconsistent and distractible. No focal bony tenderness. No deformity. She has intact range of motion although limited by pain. Symmetric extremity circumferences in the upper and lower extremities. No lower extremity tenderness.  No edema. Neurologic:   Normal speech and language.  Motor grossly intact. No gross focal neurologic deficits are appreciated.  Skin:    Skin is warm, dry and intact. No rash noted.  No petechiae, purpura, or bullae. Multiple well-healed surgical incisions on the right upper extremity  ____________________________________________    LABS (pertinent positives/negatives) (all labs ordered are listed, but only abnormal results are displayed) Labs Reviewed  CBC WITH DIFFERENTIAL/PLATELET - Abnormal; Notable for the following:       Result Value   RDW 14.6 (*)    Lymphs Abs 3.7 (*)    Monocytes Absolute 1.1 (*)    All other components within normal limits  COMPREHENSIVE METABOLIC PANEL - Abnormal; Notable for the following:    Potassium 3.2 (*)    CO2 20 (*)    Glucose, Bld 126 (*)    Total Protein 8.2 (*)    All other components within normal limits  ACETAMINOPHEN LEVEL   ____________________________________________   EKG  Interpreted by me Sinus tachycardia rate 111, axis intervals QRS ST segments and T waves.  ____________________________________________    RADIOLOGY  Dg Shoulder Right  Result Date: 04/18/2017 CLINICAL DATA:  Right shoulder pain since the surgery for a proximal humerus fracture 1 month ago. EXAM: RIGHT SHOULDER - 2+ VIEW COMPARISON:  01/18/2017 and 03/11/2017. FINDINGS: Intramedullary rod and screw fixation of the previously demonstrated comminuted proximal humerus fracture. The fracture lines are still visible with no interval bridging  callus. No acute fracture or dislocation seen. IMPRESSION: Stable hardware fixation of a comminuted proximal right humerus fracture with no interval healing. Electronically Signed   By: Beckie Salts M.D.   On: 04/18/2017 18:31   Dg Humerus Right  Result Date: 04/18/2017 CLINICAL DATA:  Worsening right upper  arm pain for the past month since surgical repair of a proximal humerus fracture. EXAM: RIGHT HUMERUS - 2+ VIEW COMPARISON:  Right shoulder dated 01/18/2017 and 03/11/2017 and today. FINDINGS: Intramedullary rod and screw fixation of the previously demonstrated comminuted proximal humerus fracture. There is callus formation distal to the fracture site with no bridging callus. No acute fracture or dislocation seen. IMPRESSION: Partially healing proximal humerus fracture with hardware fixation. Electronically Signed   By: Beckie SaltsSteven  Reid M.D.   On: 04/18/2017 18:33    ____________________________________________   PROCEDURES Procedures  ____________________________________________   INITIAL IMPRESSION / ASSESSMENT AND PLAN / ED COURSE  Pertinent labs & imaging results that were available during my care of the patient were reviewed by me and considered in my medical decision making (see chart for details).  Patient well appearing no acute distress, presents with chronic right shoulder pain. Appears to be related to adhesive capsulitis from not doing her rehabilitation exercises after surgery as well as hyperesthesia from history of opioid dependence and recent opioid exposure and overuse. A week ago she was seen in the ED for nausea vomiting diarrhea which I suspect was actually opioid withdrawal.  Considering the patient's symptoms, medical history, and physical examination today, I have low suspicion for ACS, PE, TAD, pneumothorax, carditis, mediastinitis, pneumonia, CHF, or sepsis. No evidence of any soft tissue or bony infection in the surgical area. X-rays do show that the bones themselves are  not healing despite being 6 weeks out from surgery. Advised the patient she should [facility] or [facility].  .      ____________________________________________   FINAL CLINICAL IMPRESSION(S) / ED DIAGNOSES  Final diagnoses:  Closed fracture of proximal end of right humerus with malunion, unspecified fracture morphology, subsequent encounter  Chronic right shoulder pain      New Prescriptions   No medications on file     Portions of this note were generated with dragon dictation software. Dictation errors may occur despite best attempts at proofreading.    Sharman CheekStafford, Carlyn Lemke, MD 04/18/17 2025

## 2017-04-18 NOTE — ED Notes (Signed)
Patient transported to X-ray 

## 2017-04-18 NOTE — Discharge Instructions (Signed)
Your xray shows poor healing of the fracture on your right humerus.  The hardware is in place.  You should see an advanced [facility] or [facility] for further assessment. Continue taking tylenol 1000mg  three times a day for pain control. It is important to do rehab and range of motion exercises to maintain mobility and function of the joint.   Results for orders placed or performed during the hospital encounter of 04/18/17  CBC with Differential/Platelet  Result Value Ref Range   WBC 9.9 3.6 - 11.0 K/uL   RBC 5.04 3.80 - 5.20 MIL/uL   Hemoglobin 15.4 12.0 - 16.0 g/dL   HCT 78.245.6 95.635.0 - 21.347.0 %   MCV 90.5 80.0 - 100.0 fL   MCH 30.6 26.0 - 34.0 pg   MCHC 33.9 32.0 - 36.0 g/dL   RDW 08.614.6 (H) 57.811.5 - 46.914.5 %   Platelets 292 150 - 440 K/uL   Neutrophils Relative % 50 %   Neutro Abs 5.0 1.4 - 6.5 K/uL   Lymphocytes Relative 38 %   Lymphs Abs 3.7 (H) 1.0 - 3.6 K/uL   Monocytes Relative 11 %   Monocytes Absolute 1.1 (H) 0.2 - 0.9 K/uL   Eosinophils Relative 0 %   Eosinophils Absolute 0.0 0 - 0.7 K/uL   Basophils Relative 1 %   Basophils Absolute 0.1 0 - 0.1 K/uL  Acetaminophen level  Result Value Ref Range   Acetaminophen (Tylenol), Serum 27 10 - 30 ug/mL  Comprehensive metabolic panel  Result Value Ref Range   Sodium 137 135 - 145 mmol/L   Potassium 3.2 (L) 3.5 - 5.1 mmol/L   Chloride 104 101 - 111 mmol/L   CO2 20 (L) 22 - 32 mmol/L   Glucose, Bld 126 (H) 65 - 99 mg/dL   BUN 14 6 - 20 mg/dL   Creatinine, Ser 6.290.69 0.44 - 1.00 mg/dL   Calcium 52.810.1 8.9 - 41.310.3 mg/dL   Total Protein 8.2 (H) 6.5 - 8.1 g/dL   Albumin 4.7 3.5 - 5.0 g/dL   AST 21 15 - 41 U/L   ALT 17 14 - 54 U/L   Alkaline Phosphatase 110 38 - 126 U/L   Total Bilirubin 0.9 0.3 - 1.2 mg/dL   GFR calc non Af Amer >60 >60 mL/min   GFR calc Af Amer >60 >60 mL/min   Anion gap 13 5 - 15   Dg Chest 2 View  Result Date: 04/11/2017 CLINICAL DATA:  Cough for 1 week. EXAM: CHEST  2 VIEW COMPARISON:  Chest x-ray dated  01/18/2017. FINDINGS: Heart size and mediastinal contours are normal. Atherosclerotic changes noted at the aortic arch. Lungs are clear. No pleural effusion or pneumothorax seen. No acute or suspicious osseous finding. Fixation hardware now present within the proximal right humerus. IMPRESSION: No active cardiopulmonary disease. No evidence of pneumonia or pulmonary edema. Aortic atherosclerosis. Electronically Signed   By: Bary RichardStan  Maynard M.D.   On: 04/11/2017 19:14   Dg Shoulder Right  Result Date: 04/18/2017 CLINICAL DATA:  Right shoulder pain since the surgery for a proximal humerus fracture 1 month ago. EXAM: RIGHT SHOULDER - 2+ VIEW COMPARISON:  01/18/2017 and 03/11/2017. FINDINGS: Intramedullary rod and screw fixation of the previously demonstrated comminuted proximal humerus fracture. The fracture lines are still visible with no interval bridging callus. No acute fracture or dislocation seen. IMPRESSION: Stable hardware fixation of a comminuted proximal right humerus fracture with no interval healing. Electronically Signed   By: Zada FindersSteven  Reid M.D.  On: 04/18/2017 18:31   Dg Humerus Right  Result Date: 04/18/2017 CLINICAL DATA:  Worsening right upper arm pain for the past month since surgical repair of a proximal humerus fracture. EXAM: RIGHT HUMERUS - 2+ VIEW COMPARISON:  Right shoulder dated 01/18/2017 and 03/11/2017 and today. FINDINGS: Intramedullary rod and screw fixation of the previously demonstrated comminuted proximal humerus fracture. There is callus formation distal to the fracture site with no bridging callus. No acute fracture or dislocation seen. IMPRESSION: Partially healing proximal humerus fracture with hardware fixation. Electronically Signed   By: Beckie Salts M.D.   On: 04/18/2017 18:33   US Abdomen Limited Ruq  Result Date: 04/11/2017 CLINICAL DATA:  Right upper quadrant pain, vomiting, diarrhea x2 weeks EXAM: ULTRASOUND ABDOMEN LIMITED RIGHT UPPER QUADRANT COMPARISON:  CT  abdomen/ pelvis dated 09/01/2016 FINDINGS: Gallbladder: No gallstones, gallbladder wall thickening, or pericholecystic fluid. Negative sonographic Murphy's sign. Common bile duct: Diameter: 4 mm Liver: Hyperechoic hepatic parenchyma, suggesting hepatic steatosis, with focal fatty sparing along the gallbladder fossa. IMPRESSION: Hepatic steatosis with focal fatty sparing. Electronically Signed   By: Charline Bills M.D.   On: 04/11/2017 20:23

## 2017-04-18 NOTE — ED Notes (Signed)
Pt requesting pain medication. EDP aware 

## 2017-04-18 NOTE — ED Triage Notes (Signed)
Pt arrives to ER via [facility] from home c/o right shoulder pain X 1 week. Pt reports taking extra strength tylenol withotu relief at home. Pt had surgery to right shoulder in June. Pt reports that she is unable to move shoulder due to pain and is afraid that she may not be ablet o move shoulder and use if she does not use regularly. 20G to left hand by EMS, VSS with EMS, 202 CBG with EMS. Pt alert and oriented X4, active, cooperative, pt in NAD. RR even and unlabored, color WNL.

## 2017-04-18 NOTE — ED Notes (Signed)
Report to Kenny, RN

## 2017-04-18 NOTE — ED Notes (Signed)
Pt resting in bed eyes closed.

## 2017-04-18 NOTE — ED Notes (Signed)
Denies cp or SOB.

## 2017-04-25 ENCOUNTER — Inpatient Hospital Stay: Admission: RE | Admit: 2017-04-25 | Payer: No Typology Code available for payment source | Source: Ambulatory Visit

## 2017-04-30 ENCOUNTER — Other Ambulatory Visit: Payer: Self-pay | Admitting: Neurology

## 2017-04-30 DIAGNOSIS — R519 Headache, unspecified: Secondary | ICD-10-CM

## 2017-04-30 DIAGNOSIS — R51 Headache: Principal | ICD-10-CM

## 2017-05-07 ENCOUNTER — Ambulatory Visit
Admission: RE | Admit: 2017-05-07 | Discharge: 2017-05-07 | Disposition: A | Discharge status: Home / Self Care | Payer: No Typology Code available for payment source | Source: Ambulatory Visit | Attending: Neurology | Admitting: Neurology

## 2017-05-09 ENCOUNTER — Ambulatory Visit
Admission: RE | Admit: 2017-05-09 | Discharge: 2017-05-09 | Disposition: A | Discharge status: Home / Self Care | Payer: No Typology Code available for payment source | Source: Ambulatory Visit | Attending: Neurology | Admitting: Neurology

## 2017-05-09 DIAGNOSIS — S060X9S Concussion with loss of consciousness of unspecified duration, sequela: Secondary | ICD-10-CM | POA: Insufficient documentation

## 2017-05-09 DIAGNOSIS — I619 Nontraumatic intracerebral hemorrhage, unspecified: Secondary | ICD-10-CM | POA: Diagnosis not present

## 2017-05-09 DIAGNOSIS — R519 Headache, unspecified: Secondary | ICD-10-CM

## 2017-05-09 DIAGNOSIS — R51 Headache: Secondary | ICD-10-CM | POA: Insufficient documentation

## 2017-05-09 DIAGNOSIS — I639 Cerebral infarction, unspecified: Secondary | ICD-10-CM | POA: Diagnosis not present

## 2017-05-09 MED ORDER — IOPAMIDOL (ISOVUE-300) INJECTION 61%
75.0000 mL | Freq: Once | INTRAVENOUS | Status: AC | PRN
  Administered 2017-05-09: 75 mL via INTRAVENOUS

## 2017-06-13 NOTE — Telephone Encounter (Signed)
ERROR

## 2017-06-25 ENCOUNTER — Other Ambulatory Visit: Payer: Self-pay | Admitting: Student

## 2017-06-25 DIAGNOSIS — R110 Nausea: Secondary | ICD-10-CM

## 2017-06-25 DIAGNOSIS — G8929 Other chronic pain: Secondary | ICD-10-CM

## 2017-06-25 DIAGNOSIS — R109 Unspecified abdominal pain: Secondary | ICD-10-CM

## 2017-07-05 ENCOUNTER — Ambulatory Visit
Admission: RE | Admit: 2017-07-05 | Discharge: 2017-07-05 | Disposition: A | Discharge status: Home / Self Care | Payer: No Typology Code available for payment source | Source: Ambulatory Visit | Attending: Student | Admitting: Student

## 2017-07-05 DIAGNOSIS — G8929 Other chronic pain: Secondary | ICD-10-CM | POA: Insufficient documentation

## 2017-07-05 DIAGNOSIS — R109 Unspecified abdominal pain: Secondary | ICD-10-CM | POA: Insufficient documentation

## 2017-07-05 DIAGNOSIS — R110 Nausea: Secondary | ICD-10-CM | POA: Insufficient documentation

## 2017-07-05 MED ORDER — TECHNETIUM TC 99M SULFUR COLLOID
2.1500 | Freq: Once | INTRAVENOUS | Status: AC | PRN
  Administered 2017-07-05: 2.15 via ORAL

## 2017-07-21 ENCOUNTER — Ambulatory Visit: Payer: Self-pay | Admitting: Psychiatry

## 2017-08-18 ENCOUNTER — Encounter (HOSPITAL_COMMUNITY): Payer: Self-pay

## 2017-08-18 ENCOUNTER — Emergency Department (HOSPITAL_COMMUNITY): Payer: No Typology Code available for payment source

## 2017-08-18 ENCOUNTER — Emergency Department (HOSPITAL_COMMUNITY)
Admission: EM | Admit: 2017-08-18 | Discharge: 2017-08-18 | Disposition: A | Discharge status: Home / Self Care | Payer: No Typology Code available for payment source | Attending: Emergency Medicine | Admitting: Emergency Medicine

## 2017-08-18 ENCOUNTER — Other Ambulatory Visit: Payer: Self-pay

## 2017-08-18 DIAGNOSIS — G43909 Migraine, unspecified, not intractable, without status migrainosus: Secondary | ICD-10-CM | POA: Diagnosis present

## 2017-08-18 DIAGNOSIS — Z7901 Long term (current) use of anticoagulants: Secondary | ICD-10-CM | POA: Insufficient documentation

## 2017-08-18 DIAGNOSIS — R51 Headache: Secondary | ICD-10-CM | POA: Insufficient documentation

## 2017-08-18 DIAGNOSIS — R519 Headache, unspecified: Secondary | ICD-10-CM

## 2017-08-18 DIAGNOSIS — Z8673 Personal history of transient ischemic attack (TIA), and cerebral infarction without residual deficits: Secondary | ICD-10-CM | POA: Insufficient documentation

## 2017-08-18 DIAGNOSIS — Z79899 Other long term (current) drug therapy: Secondary | ICD-10-CM | POA: Insufficient documentation

## 2017-08-18 DIAGNOSIS — R109 Unspecified abdominal pain: Secondary | ICD-10-CM | POA: Insufficient documentation

## 2017-08-18 DIAGNOSIS — B1710 Acute hepatitis C without hepatic coma: Secondary | ICD-10-CM | POA: Insufficient documentation

## 2017-08-18 DIAGNOSIS — Z7984 Long term (current) use of oral hypoglycemic drugs: Secondary | ICD-10-CM | POA: Diagnosis not present

## 2017-08-18 DIAGNOSIS — N3000 Acute cystitis without hematuria: Secondary | ICD-10-CM | POA: Insufficient documentation

## 2017-08-18 DIAGNOSIS — F17210 Nicotine dependence, cigarettes, uncomplicated: Secondary | ICD-10-CM | POA: Insufficient documentation

## 2017-08-18 DIAGNOSIS — I10 Essential (primary) hypertension: Secondary | ICD-10-CM | POA: Insufficient documentation

## 2017-08-18 DIAGNOSIS — E119 Type 2 diabetes mellitus without complications: Secondary | ICD-10-CM | POA: Insufficient documentation

## 2017-08-18 LAB — COMPREHENSIVE METABOLIC PANEL
ALBUMIN: 4 g/dL (ref 3.5–5.0)
ALT: 16 U/L (ref 14–54)
AST: 18 U/L (ref 15–41)
Alkaline Phosphatase: 123 U/L (ref 38–126)
Anion gap: 5 (ref 5–15)
BUN: 12 mg/dL (ref 6–20)
CHLORIDE: 109 mmol/L (ref 101–111)
CO2: 27 mmol/L (ref 22–32)
Calcium: 9.5 mg/dL (ref 8.9–10.3)
Creatinine, Ser: 0.61 mg/dL (ref 0.44–1.00)
GFR calc Af Amer: >60 mL/min (ref >60)
Glucose, Bld: 89 mg/dL (ref 65–99)
POTASSIUM: 4 mmol/L (ref 3.5–5.1)
SODIUM: 141 mmol/L (ref 135–145)
Total Bilirubin: 0.8 mg/dL (ref 0.3–1.2)
Total Protein: 7.3 g/dL (ref 6.5–8.1)

## 2017-08-18 LAB — URINALYSIS, ROUTINE W REFLEX MICROSCOPIC
BILIRUBIN URINE: NEGATIVE
HGB URINE DIPSTICK: NEGATIVE
KETONES UR: NEGATIVE mg/dL
NITRITE: NEGATIVE
PH: 7 (ref 5.0–8.0)
Protein, ur: NEGATIVE mg/dL
SPECIFIC GRAVITY, URINE: 1.001 — AB (ref 1.005–1.030)

## 2017-08-18 LAB — CBC
HEMATOCRIT: 44.7 % (ref 36.0–46.0)
Hemoglobin: 14.8 g/dL (ref 12.0–15.0)
MCH: 31.6 pg (ref 26.0–34.0)
MCHC: 33.1 g/dL (ref 30.0–36.0)
MCV: 95.3 fL (ref 78.0–100.0)
PLATELETS: 266 10*3/uL (ref 150–400)
RBC: 4.69 MIL/uL (ref 3.87–5.11)
RDW: 13.7 % (ref 11.5–15.5)
WBC: 11.8 10*3/uL — AB (ref 4.0–10.5)

## 2017-08-18 LAB — LIPASE, BLOOD: LIPASE: 28 U/L (ref 11–51)

## 2017-08-18 MED ORDER — HYDROMORPHONE HCL 1 MG/ML IJ SOLN
1.0000 mg | Freq: Once | INTRAMUSCULAR | Status: AC
  Administered 2017-08-18: 1 mg via INTRAVENOUS
  Filled 2017-08-18: qty 1

## 2017-08-18 MED ORDER — SODIUM CHLORIDE 0.9 % IV BOLUS (SEPSIS)
500.0000 mL | Freq: Once | INTRAVENOUS | Status: AC
  Administered 2017-08-18: 500 mL via INTRAVENOUS

## 2017-08-18 MED ORDER — PROMETHAZINE HCL 25 MG/ML IJ SOLN
12.5000 mg | Freq: Once | INTRAMUSCULAR | Status: AC
  Administered 2017-08-18: 12.5 mg via INTRAVENOUS
  Filled 2017-08-18: qty 1

## 2017-08-18 MED ORDER — LEVOFLOXACIN 750 MG PO TABS: 750.0000 mg | ORAL_TABLET | Freq: Every day | ORAL | 0 refills | Status: AC

## 2017-08-18 MED ORDER — LEVOFLOXACIN 750 MG PO TABS
750.0000 mg | ORAL_TABLET | Freq: Once | ORAL | Status: AC
  Administered 2017-08-18: 750 mg via ORAL
  Filled 2017-08-18: qty 1

## 2017-08-18 NOTE — ED Notes (Signed)
ED Provider at bedside. 

## 2017-08-18 NOTE — ED Notes (Signed)
Urine culture delayed.  Pt currently in CT and is unable to give a sample at this time.

## 2017-08-18 NOTE — ED Notes (Signed)
Patient stumbling around room with IV removed and in hand. Patient reports she is supposed to get more pain medication and be discharged. This RN gave patient PO antibiotics and spoke with MD. Reports patient could have IM pain medication or start another IV and administer the medication. Patient refused IM injection stating it does not work as well. Husband at bedside [facility] and will be back when the patient is discharged 30 mins after medication administered.

## 2017-08-18 NOTE — ED Notes (Signed)
At this time pt is cursing regarding being told by the nurse tech to return to room after being found wandering around department. Pt states "I don't want that bitch back in here." Dr.Tegeler notified of pt behavior towards staff along with charge nurse.

## 2017-08-18 NOTE — ED Notes (Signed)
Patient transported to CT 

## 2017-08-18 NOTE — ED Provider Notes (Signed)
[facility]-EMERGENCY DEPT Provider Note   CSN: [id number] Arrival date & time: 08/18/17  1511     History   Chief Complaint Chief Complaint  Patient presents with  . Migraine  . Abdominal Pain    HPI Stephanie [name] is a 51 y.o. female.  The history is provided by the patient and medical records. No language interpreter was used.  Headache   This is a recurrent problem. The current episode started 2 days ago. The problem occurs constantly. The problem has not changed since onset.The headache is associated with bright light. The pain is located in the bilateral region. The quality of the pain is described as dull and throbbing. The pain is at a severity of 9/10. The pain is severe. The pain does not radiate. Associated symptoms include malaise/fatigue, nausea and vomiting. Pertinent negatives include no fever, no chest pressure, no palpitations, no syncope and no shortness of breath. She has tried NSAIDs for the symptoms. The treatment provided no relief.  Flank Pain  This is a recurrent problem. The current episode started 2 days ago. The problem occurs constantly. The problem has not changed since onset.Associated symptoms include abdominal pain and headaches. Pertinent negatives include no chest pain and no shortness of breath. Nothing aggravates the symptoms. Nothing relieves the symptoms. She has tried nothing for the symptoms. The treatment provided no relief.    Past Medical History:  Diagnosis Date  . Diabetes mellitus without complication (HCC)   . Diverticulitis   . Headache   . Hepatitis C   . History of kidney stones   . Hypertension   . IBS (irritable bowel syndrome)   . Kidney stones   . Sciatica   . Sleep apnea   . Stroke [facility](HCC)     Patient Active Problem List   Diagnosis Date Noted  . Headache 01/07/2017  . Small bowel obstruction (HCC) 09/02/2016  . Pain and swelling of left upper extremity 07/22/2016  . Superficial venous  thrombosis of arm, left 07/22/2016  . ICH (intracerebral hemorrhage) (HCC)   . Essential hypertension, malignant 04/19/2016  . Cytotoxic brain edema (HCC) 04/19/2016  . IVH (intraventricular hemorrhage) (HCC) 04/18/2016  . Hepatitis C     Past Surgical History:  Procedure Laterality Date  . ABDOMINAL HYSTERECTOMY    . carpel tunnel syndrome  2017  . HUMERUS IM NAIL Right 03/11/2017   Procedure: INTRAMEDULLARY (IM) NAIL HUMERAL;  Surgeon: Lyndle HerrlichBowers, James R, MD;  Location: [facility] ORS;  Service: Orthopedics;  Laterality: Right;  . KIDNEY STONE SURGERY Right 02/12/12  . KIDNEY STONE SURGERY Left 07/15/2012  . LIGATION OF ARTERIOVENOUS  FISTULA Left 06/21/2016   Procedure: LIGATION OF ARTERIOVENOUS  FISTULA ( LIGATION BASILIC VEIN );  Surgeon: Renford DillsGregory G Schnier, MD;  Location: [facility] ORS;  Service: Vascular;  Laterality: Left;  . LITHOTRIPSY    . OOPHORECTOMY    . SINUS EXPLORATION    . TONSILLECTOMY      OB History    No data available       Home Medications    Prior to Admission medications   Medication Sig Start Date End Date Taking? Authorizing Provider  acetaminophen (TYLENOL) 500 MG tablet Take 1,000 mg by mouth 2 (two) times daily.    [provider]  alprazolam Prudy Feeler(XANAX) 2 MG tablet Take 1 mg by mouth daily as needed for anxiety.     [provider]  amLODipine (NORVASC) 10 MG tablet Take 10 mg by mouth daily.  [provider]  atorvastatin (LIPITOR) 40 MG tablet Take 40 mg by mouth every evening.    [provider]  carisoprodol (SOMA) 350 MG tablet Take 1 tablet (350 mg total) by mouth 3 (three) times daily as needed. 04/11/17   Myrna BlazerSchaevitz, David Matthew, MD  Cholecalciferol (VITAMIN D) 2000 units CAPS Take 2,000 Units by mouth daily.    [provider]  clopidogrel (PLAVIX) 75 MG tablet Take 1 tablet (75 mg total) by mouth daily. 06/27/16   Marvel PlanXu, Jindong, MD  diphenoxylate-atropine (LOMOTIL) 2.5-0.025 MG tablet Take 1 tablet by mouth 4  (four) times daily as needed for diarrhea or loose stools.    [provider]  DULoxetine (CYMBALTA) 60 MG capsule Take 60 mg by mouth daily.    [provider]  glipiZIDE (GLUCOTROL) 5 MG tablet Take 1 tablet (5 mg total) by mouth 2 (two) times daily before a meal. 04/30/16   Lora PaulaKrall, Jennifer T, MD  INVOKANA 300 MG TABS tablet Take 300 mg by mouth daily before breakfast.     [provider]  levETIRAcetam (KEPPRA) 500 MG tablet Take 1 tablet (500 mg total) by mouth 2 (two) times daily. 04/30/16   Lora PaulaKrall, Jennifer T, MD  loratadine (CLARITIN) 10 MG tablet Take 10 mg by mouth daily.    [provider]  losartan (COZAAR) 50 MG tablet Take 50 mg by mouth 2 (two) times daily.    [provider]  metoprolol (LOPRESSOR) 50 MG tablet Take 50 mg by mouth 2 (two) times daily.     [provider]  omeprazole (PRILOSEC) 40 MG capsule Take 40 mg by mouth daily.    [provider]  ondansetron (ZOFRAN) 4 MG tablet Take 1 tablet (4 mg total) by mouth daily as needed. 04/11/17   Schaevitz, Myra Rudeavid Matthew, MD  oxyCODONE (OXY IR/ROXICODONE) 5 MG immediate release tablet Take 1-2 tablets (5-10 mg total) by mouth every 4 (four) hours as needed for severe pain. 03/11/17   Lyndle HerrlichBowers, James R, MD  topiramate (TOPAMAX) 25 MG tablet 25mg  bid for 7 days and then 50mg  bid Patient taking differently: Take 50 mg by mouth 2 (two) times daily. 25mg  bid for 7 days and then 50mg  bid 01/07/17   Marvel PlanXu, Jindong, MD    Family History Family History  Problem Relation Age of Onset  . Heart failure Mother   . Diabetes Father   . Cancer Sister     Social History Social History   Tobacco Use  . Smoking status: Current Every Day Smoker    Packs/day: 0.25    Years: 1.50    Pack years: 0.37    Types: Cigarettes  . Smokeless tobacco: Never Used  Substance Use Topics  . Alcohol use: No    Comment: no alcohol since 2002  . Drug use: No    Comment: electric cig     Allergies     Aspirin; Buprenorphine hcl; Codeine; Compazine; Ioxaglate; Ivp dye [iodinated diagnostic agents]; Metrizamide; Morphine and related; Naproxen; Norco [hydrocodone-acetaminophen]; Penicillin g; Penicillins; Prochlorperazine maleate; Reglan [metoclopramide]; Sulfur; Tegretol [carbamazepine]; Toradol [ketorolac tromethamine]; and Tramadol   Review of Systems Review of Systems  Constitutional: Positive for fatigue and malaise/fatigue. Negative for chills, diaphoresis and fever.  HENT: Negative for congestion.   Eyes: Positive for photophobia. Negative for visual disturbance.  Respiratory: Negative for cough, chest tightness, shortness of breath, wheezing and stridor.   Cardiovascular: Negative for chest pain, palpitations, leg swelling and syncope.  Gastrointestinal: Positive for abdominal pain,  nausea and vomiting. Negative for constipation and diarrhea.  Genitourinary: Positive for flank pain and frequency. Negative for difficulty urinating and dysuria.  Musculoskeletal: Negative for back pain, neck pain and neck stiffness.  Neurological: Positive for headaches. Negative for weakness, light-headedness and numbness.  Psychiatric/Behavioral: Negative for agitation.  All other systems reviewed and are negative.    Physical Exam Updated Vital Signs BP (!) 148/96 (BP Location: Left Arm)   Pulse 77   Temp (!) 97.5 F (36.4 C) (Oral)   Resp 14   Ht 5\' 2"  (1.575 m)   Wt 63 kg (139 lb)   SpO2 99%   BMI 25.42 kg/m   Physical Exam  Constitutional: She is oriented to person, place, and time. She appears well-developed and well-nourished. She does not appear ill. No distress.  HENT:  Head: Normocephalic and atraumatic.  Nose: Nose normal.  Mouth/Throat: Oropharynx is clear and moist. No oropharyngeal exudate.  Eyes: Conjunctivae and EOM are normal. Pupils are equal, round, and reactive to light.  Neck: Normal range of motion.  Cardiovascular: Normal rate and intact distal pulses.  No  murmur heard. Pulmonary/Chest: Effort normal. No respiratory distress. She has no wheezes. She exhibits no tenderness.  Abdominal: Soft. Bowel sounds are normal. There is tenderness in the right upper quadrant. There is CVA tenderness. There is no rigidity, no rebound and no guarding.    Musculoskeletal:       Thoracic back: She exhibits tenderness and pain.       Back:       Right hand: She exhibits laceration and swelling. She exhibits normal range of motion, no tenderness, no bony tenderness and no deformity. Normal sensation noted. Normal strength noted.       Hands: Normal sensation and strength in all extremities.  Normal pulses in right hand and normal capillary refill.  Neurological: She is alert and oriented to person, place, and time. She is not disoriented. She displays no tremor. No cranial nerve deficit or sensory deficit. She exhibits normal muscle tone. GCS eye subscore is 4. GCS verbal subscore is 5. GCS motor subscore is 6.  Skin: Skin is warm. Capillary refill takes less than 2 seconds. No rash noted. She is not diaphoretic. No erythema. No pallor.  Psychiatric: She has a normal mood and affect.  Nursing note and vitals reviewed.    ED Treatments / Results  Labs (all labs ordered are listed, but only abnormal results are displayed) Labs Reviewed  CBC - Abnormal; Notable for the following components:      Result Value   WBC 11.8 (*)    All other components within normal limits  URINALYSIS, ROUTINE W REFLEX MICROSCOPIC - Abnormal; Notable for the following components:   Color, Urine STRAW (*)    Specific Gravity, Urine 1.001 (*)    Glucose, UA >=500 (*)    Leukocytes, UA LARGE (*)    Bacteria, UA RARE (*)    Squamous Epithelial / LPF 0-5 (*)    All other components within normal limits  URINE CULTURE  LIPASE, BLOOD  COMPREHENSIVE METABOLIC PANEL    EKG  EKG Interpretation None       Radiology Ct Head Wo Contrast  Result Date: 08/18/2017 CLINICAL  DATA:  Headaches EXAM: CT HEAD WITHOUT CONTRAST TECHNIQUE: Contiguous axial images were obtained from the base of the skull through the vertex without intravenous contrast. 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 No acute hemorrhage, acute infarction or space-occupying mass lesion  is noted. Vascular: No hyperdense vessel or unexpected calcification. Skull: Normal. Negative for fracture or focal lesion. Sinuses/Orbits: Postsurgical changes are noted in the right maxillary antrum. Mucosal thickening is noted within the sphenoid sinus on the right. Other: None. IMPRESSION: Chronic changes without acute abnormality. Electronically Signed   By: Alcide Clever M.D.   On: 08/18/2017 17:16   Ct Renal Stone Study  Result Date: 08/18/2017 CLINICAL DATA:  Mid abdominal pain since yesterday. Nausea vomiting and diarrhea. EXAM: CT ABDOMEN AND PELVIS WITHOUT CONTRAST TECHNIQUE: Multidetector CT imaging of the abdomen and pelvis was performed following the standard protocol without IV contrast. COMPARISON:  07/25/2017 FINDINGS: Lower chest:  Unremarkable. Hepatobiliary: No focal abnormality in the liver on this study without intravenous contrast. There is no evidence for gallstones, gallbladder wall thickening, or pericholecystic fluid. No intrahepatic or extrahepatic biliary dilation. Pancreas: No focal mass lesion. No dilatation of the main duct. No intraparenchymal cyst. No peripancreatic edema. Spleen: No splenomegaly. No focal mass lesion. Adrenals/Urinary Tract: Right adrenal gland unremarkable. Stable 11 mm left adrenal nodule compatible with adenoma. Bilateral renal stones again noted. 3 mm nonobstructing stone seen upper pole left kidney. No left ureteral stones. At least 7 tiny stones are seen scattered in the right kidney with prominent areas of focal cortical scarring. No right hydroureteronephrosis. No right ureteral stone. No bladder stone. Stomach/Bowel: Stomach is  nondistended. No gastric wall thickening. No evidence of outlet obstruction. Duodenum is normally positioned as is the ligament of Treitz. No small bowel wall thickening. No small bowel dilatation. The terminal ileum is normal. The appendix is normal. Diverticular changes are noted in the left colon without evidence of diverticulitis. Vascular/Lymphatic: There is abdominal aortic atherosclerosis without aneurysm. There is no gastrohepatic or hepatoduodenal ligament lymphadenopathy. No intraperitoneal or retroperitoneal lymphadenopathy. No pelvic sidewall lymphadenopathy. Reproductive: Uterus surgically absent.  There is no adnexal mass. Other: No intraperitoneal free fluid. Musculoskeletal: Bone windows reveal no worrisome lytic or sclerotic osseous lesions. IMPRESSION: 1. Stable exam.  No acute findings in the abdomen or pelvis. 2. Bilateral nephrolithiasis, right greater than left. No hydroureteronephrosis. No evidence for ureteral or bladder stones. 3. Right renal scarring. 4. Stable 11 mm left adrenal adenoma. Electronically Signed   By: Kennith Center M.D.   On: 08/18/2017 17:18    Procedures Procedures (including critical care time)  Medications Ordered in ED Medications  HYDROmorphone (DILAUDID) injection 1 mg (1 mg Intravenous Given 08/18/17 1739)  promethazine (PHENERGAN) injection 12.5 mg (12.5 mg Intravenous Given 08/18/17 1738)  sodium chloride 0.9 % bolus 500 mL (0 mLs Intravenous Stopped 08/18/17 1859)  HYDROmorphone (DILAUDID) injection 1 mg (1 mg Intravenous Given 08/18/17 2120)  levofloxacin (LEVAQUIN) tablet 750 mg (750 mg Oral Given 08/18/17 2105)     Initial Impression / Assessment and Plan / ED Course  I have reviewed the triage vital signs and the nursing notes.  Pertinent labs & imaging results that were available during my care of the patient were reviewed by me and considered in my medical decision making (see chart for details).     SMITA LESH is a 51 y.o.  female with a complicated past medical history including hypertension, diabetes, hepatitis C, CHF, diverticulitis and small bowel obstruction, recurrent kidney stones, intracerebral hemorrhage, and chronic headaches who presents with headache, right-sided flank pain, right abdominal pain, urinary frequency, and darkened urine.  Patient reports that her symptoms all began 2 days ago.  She reports that last year she had an intracerebral hemorrhage that was  due to blood pressure being elevated.  She says that has been doing better recently.  She denies recent falls or injuries but says that her headache began 2 days ago it is severe.  It is similar to prior migraines.  There is photophobia.  Patient denies any vision changes but reports nausea and vomiting.  She has not been able to tolerate oral intake over the last 2 days.  She reports her urine has been slightly darker and pink.  She reports that she is having flank pain bilaterally but worse on the right that feels similar to prior kidney stone pains.  She reports the abdominal pain is severe.  She denies any constipation or diarrhea.  She denies fevers, chills, chest pain, shortness of breath or productive cough.  She denies other complaints.  On exam, patient had a scratch on her right dorsum of her hand.  She reports that her cat scratched her several days ago and is also got bruised.  She reports no signs or symptoms of infection and says that it has been doing well.  Exam otherwise had tenderness in the right flank and right upper quadrant.  Tenderness present in the right side CVA area.  No focal neurologic deficits.  Pupils reactive and symmetric.  Normal extraocular motions.  Lungs clear and chest nontender.  Patient will have CT of the head due to her history of intracerebral hemorrhage and headache recurrence.  She also have CT stone study given her strong history of kidney stones and feels similar.  Urinalysis will be collected as it will be lab  work.  After long discussion of medications, Dilaudid and Phenergan was chosen as this is what patient has had relief in the past.  Anticipate reassessment after workup.  Patient's diagnostic testing revealed evidence of urinary tract infection with leukocytes and bacteria in the setting of her frequency.  Mild leukocytosis.  Other laboratory testing reassuring.  I suspect patient may have pyelonephritis with the pain going towards her kidneys.  No evidence of infected stone in the ureter.  No hydronephrosis.  CT of the head showed no acute abnormality.  Patient felt better after pain medication and nausea medicine.  Given reassuring workup, patient will be treated for pyelonephritis and discharged home for PCP follow-up.  Patient has improving scratch on her hand, do not feel patient has serious cellulitis.  We will watch this and follow-up with her PCP for this.  Due to patient's chronic back pain complaints, patient will also be referred to a spine surgeon to see her.  Patient understood the plan of care and had no other questions or concerns.  Patient discharged in good condition.   Final Clinical Impressions(s) / ED Diagnoses   Final diagnoses:  Nonintractable headache, unspecified chronicity pattern, unspecified headache type  Flank pain  Acute cystitis without hematuria    ED Discharge Orders        Ordered    levofloxacin (LEVAQUIN) 750 MG tablet  Daily     08/18/17 2050      Clinical Impression: 1. Nonintractable headache, unspecified chronicity pattern, unspecified headache type   2. Flank pain   3. Acute cystitis without hematuria     Disposition: Discharge  Condition: Good  I have discussed the results, Dx and Tx plan with the pt(& family if present). He/she/they expressed understanding and agree(s) with the plan. Discharge instructions discussed at great length. Strict return precautions discussed and pt &/or family have verbalized understanding of the instructions.  No further  questions at time of discharge.    This SmartLink is deprecated. Use AVSMEDLIST instead to display the medication list for a patient.  Follow Up: Marisue Ivan, MD [id number] [facility] [address] [facility] [place] [address] [phone]     [facility] [address] [id number] [address] [place] [address] [phone]  If symptoms worsen  Tia Alert, MD [address] [place] [address] [phone]        Lantz Hermann, Canary Brim, MD 08/19/17 [phone]

## 2017-08-18 NOTE — ED Triage Notes (Signed)
Patient c/o abdominal mid abdominal pain since yesterday. Patient c/o N/V/D. Patient states she has been taking Phenergan with no relief. Patient c/o migraine 6 days ago and states she had a nerve block 2 weeks ago and it only helped her for 30 minutes.

## 2017-08-18 NOTE — Discharge Instructions (Signed)
Your workup today did not show evidence of intracranial hemorrhage as the cause of your headache.  I suspect this is her chronic headaches flaring up in the setting of urinary tract infection with the discomfort.  We did not find evidence of obstructing kidney stone.  Given your chronic back pain, please follow-up with both a primary care physician as well as a back doctor for further management.  If any symptoms change or worsen, please return to the nearest emergency department.  Please stay hydrated.

## 2017-08-21 ENCOUNTER — Observation Stay (HOSPITAL_COMMUNITY)
Admission: EM | Admit: 2017-08-21 | Discharge: 2017-08-22 | Discharge status: Against Medical Advice | Payer: No Typology Code available for payment source | Attending: Internal Medicine | Admitting: Internal Medicine

## 2017-08-21 ENCOUNTER — Emergency Department (HOSPITAL_COMMUNITY): Payer: No Typology Code available for payment source

## 2017-08-21 ENCOUNTER — Encounter (HOSPITAL_COMMUNITY): Payer: Self-pay | Admitting: Emergency Medicine

## 2017-08-21 DIAGNOSIS — Z7984 Long term (current) use of oral hypoglycemic drugs: Secondary | ICD-10-CM | POA: Insufficient documentation

## 2017-08-21 DIAGNOSIS — N12 Tubulo-interstitial nephritis, not specified as acute or chronic: Secondary | ICD-10-CM | POA: Diagnosis not present

## 2017-08-21 DIAGNOSIS — I10 Essential (primary) hypertension: Secondary | ICD-10-CM | POA: Diagnosis not present

## 2017-08-21 DIAGNOSIS — K219 Gastro-esophageal reflux disease without esophagitis: Secondary | ICD-10-CM | POA: Insufficient documentation

## 2017-08-21 DIAGNOSIS — G4733 Obstructive sleep apnea (adult) (pediatric): Secondary | ICD-10-CM | POA: Diagnosis not present

## 2017-08-21 DIAGNOSIS — F17210 Nicotine dependence, cigarettes, uncomplicated: Secondary | ICD-10-CM | POA: Insufficient documentation

## 2017-08-21 DIAGNOSIS — Z791 Long term (current) use of non-steroidal anti-inflammatories (NSAID): Secondary | ICD-10-CM | POA: Diagnosis not present

## 2017-08-21 DIAGNOSIS — Z87442 Personal history of urinary calculi: Secondary | ICD-10-CM | POA: Diagnosis not present

## 2017-08-21 DIAGNOSIS — R112 Nausea with vomiting, unspecified: Secondary | ICD-10-CM

## 2017-08-21 DIAGNOSIS — Z79899 Other long term (current) drug therapy: Secondary | ICD-10-CM | POA: Diagnosis not present

## 2017-08-21 DIAGNOSIS — M47817 Spondylosis without myelopathy or radiculopathy, lumbosacral region: Secondary | ICD-10-CM | POA: Insufficient documentation

## 2017-08-21 DIAGNOSIS — Z8673 Personal history of transient ischemic attack (TIA), and cerebral infarction without residual deficits: Secondary | ICD-10-CM | POA: Insufficient documentation

## 2017-08-21 DIAGNOSIS — Z7902 Long term (current) use of antithrombotics/antiplatelets: Secondary | ICD-10-CM | POA: Diagnosis not present

## 2017-08-21 DIAGNOSIS — F17200 Nicotine dependence, unspecified, uncomplicated: Secondary | ICD-10-CM

## 2017-08-21 DIAGNOSIS — F419 Anxiety disorder, unspecified: Secondary | ICD-10-CM | POA: Diagnosis not present

## 2017-08-21 DIAGNOSIS — N3000 Acute cystitis without hematuria: Secondary | ICD-10-CM | POA: Diagnosis not present

## 2017-08-21 DIAGNOSIS — K589 Irritable bowel syndrome without diarrhea: Secondary | ICD-10-CM | POA: Diagnosis not present

## 2017-08-21 DIAGNOSIS — G40909 Epilepsy, unspecified, not intractable, without status epilepticus: Secondary | ICD-10-CM | POA: Diagnosis not present

## 2017-08-21 DIAGNOSIS — E119 Type 2 diabetes mellitus without complications: Secondary | ICD-10-CM | POA: Diagnosis not present

## 2017-08-21 DIAGNOSIS — N390 Urinary tract infection, site not specified: Secondary | ICD-10-CM | POA: Diagnosis present

## 2017-08-21 LAB — COMPREHENSIVE METABOLIC PANEL
ALK PHOS: 112 U/L (ref 38–126)
ALT: 16 U/L (ref 14–54)
AST: 16 U/L (ref 15–41)
Albumin: 3.8 g/dL (ref 3.5–5.0)
Anion gap: 8 (ref 5–15)
BUN: 15 mg/dL (ref 6–20)
CALCIUM: 9.3 mg/dL (ref 8.9–10.3)
CO2: 25 mmol/L (ref 22–32)
CREATININE: 0.68 mg/dL (ref 0.44–1.00)
Chloride: 108 mmol/L (ref 101–111)
Glucose, Bld: 67 mg/dL (ref 65–99)
Potassium: 4 mmol/L (ref 3.5–5.1)
Sodium: 141 mmol/L (ref 135–145)
Total Bilirubin: 0.7 mg/dL (ref 0.3–1.2)
Total Protein: 7.3 g/dL (ref 6.5–8.1)

## 2017-08-21 LAB — URINALYSIS, ROUTINE W REFLEX MICROSCOPIC
BILIRUBIN URINE: NEGATIVE
Glucose, UA: 150 mg/dL — AB
Hgb urine dipstick: NEGATIVE
KETONES UR: NEGATIVE mg/dL
Nitrite: NEGATIVE
PH: 7 (ref 5.0–8.0)
PROTEIN: NEGATIVE mg/dL
Specific Gravity, Urine: 1.013 (ref 1.005–1.030)

## 2017-08-21 LAB — CBC
HEMATOCRIT: 44.5 % (ref 36.0–46.0)
Hemoglobin: 15.1 g/dL — ABNORMAL HIGH (ref 12.0–15.0)
MCH: 32.3 pg (ref 26.0–34.0)
MCHC: 33.9 g/dL (ref 30.0–36.0)
MCV: 95.1 fL (ref 78.0–100.0)
PLATELETS: 247 10*3/uL (ref 150–400)
RBC: 4.68 MIL/uL (ref 3.87–5.11)
RDW: 13.6 % (ref 11.5–15.5)
WBC: 11.3 10*3/uL — AB (ref 4.0–10.5)

## 2017-08-21 LAB — LIPASE, BLOOD: Lipase: 23 U/L (ref 11–51)

## 2017-08-21 LAB — I-STAT BETA HCG BLOOD, ED (MC, WL, AP ONLY): HCG, QUANTITATIVE: 6.1 m[IU]/mL — AB (ref <5)

## 2017-08-21 MED ORDER — CLOPIDOGREL BISULFATE 75 MG PO TABS: 75.0000 mg | ORAL_TABLET | Freq: Every day | ORAL | Status: DC

## 2017-08-21 MED ORDER — ZOLPIDEM TARTRATE 5 MG PO TABS
5.0000 mg | ORAL_TABLET | Freq: Every day | ORAL | Status: DC
  Administered 2017-08-21: 5 mg via ORAL
  Filled 2017-08-21: qty 1

## 2017-08-21 MED ORDER — GLIPIZIDE 5 MG PO TABS
5.0000 mg | ORAL_TABLET | Freq: Two times a day (BID) | ORAL | Status: DC
  Filled 2017-08-21: qty 1

## 2017-08-21 MED ORDER — METOPROLOL TARTRATE 50 MG PO TABS
50.0000 mg | ORAL_TABLET | Freq: Two times a day (BID) | ORAL | Status: DC
  Administered 2017-08-21: 50 mg via ORAL
  Filled 2017-08-21: qty 1

## 2017-08-21 MED ORDER — LOSARTAN POTASSIUM 50 MG PO TABS: 50.0000 mg | ORAL_TABLET | Freq: Two times a day (BID) | ORAL | Status: DC

## 2017-08-21 MED ORDER — ACETAMINOPHEN 325 MG PO TABS: 650.0000 mg | ORAL_TABLET | Freq: Four times a day (QID) | ORAL | Status: DC | PRN

## 2017-08-21 MED ORDER — ENOXAPARIN SODIUM 40 MG/0.4ML ~~LOC~~ SOLN
40.0000 mg | SUBCUTANEOUS | Status: DC
  Administered 2017-08-21: 40 mg via SUBCUTANEOUS
  Filled 2017-08-21: qty 0.4

## 2017-08-21 MED ORDER — NICOTINE 14 MG/24HR TD PT24: 14.0000 mg | MEDICATED_PATCH | Freq: Every day | TRANSDERMAL | Status: DC | PRN

## 2017-08-21 MED ORDER — DULOXETINE HCL 30 MG PO CPEP: 60.0000 mg | ORAL_CAPSULE | Freq: Every day | ORAL | Status: DC

## 2017-08-21 MED ORDER — TIZANIDINE HCL 4 MG PO TABS
4.0000 mg | ORAL_TABLET | Freq: Every day | ORAL | Status: DC
Start: 2017-08-22 — End: 2017-08-22

## 2017-08-21 MED ORDER — DEXTROSE 5 % IV SOLN
1.0000 g | Freq: Once | INTRAVENOUS | Status: AC
  Administered 2017-08-21: 1 g via INTRAVENOUS
  Filled 2017-08-21: qty 10

## 2017-08-21 MED ORDER — HYDROCHLOROTHIAZIDE 25 MG PO TABS: 25.0000 mg | ORAL_TABLET | Freq: Every day | ORAL | Status: DC

## 2017-08-21 MED ORDER — SODIUM CHLORIDE 0.9 % IV SOLN
INTRAVENOUS | Status: DC
  Administered 2017-08-21: 22:00:00 via INTRAVENOUS

## 2017-08-21 MED ORDER — HYDROMORPHONE HCL 1 MG/ML IJ SOLN
1.0000 mg | Freq: Once | INTRAMUSCULAR | Status: AC
  Administered 2017-08-21: 1 mg via INTRAVENOUS
  Filled 2017-08-21: qty 1

## 2017-08-21 MED ORDER — ACETAMINOPHEN 650 MG RE SUPP: 650.0000 mg | Freq: Four times a day (QID) | RECTAL | Status: DC | PRN

## 2017-08-21 MED ORDER — ALPRAZOLAM 1 MG PO TABS
1.0000 mg | ORAL_TABLET | Freq: Two times a day (BID) | ORAL | Status: DC
  Administered 2017-08-21: 1 mg via ORAL
  Filled 2017-08-21: qty 1

## 2017-08-21 MED ORDER — PROMETHAZINE HCL 25 MG/ML IJ SOLN: 12.5000 mg | Freq: Four times a day (QID) | INTRAMUSCULAR | Status: DC | PRN

## 2017-08-21 MED ORDER — LOSARTAN POTASSIUM 50 MG PO TABS: 100.0000 mg | ORAL_TABLET | Freq: Every day | ORAL | Status: DC

## 2017-08-21 MED ORDER — DEXTROSE 5 % IV SOLN: 1.0000 g | INTRAVENOUS | Status: DC

## 2017-08-21 MED ORDER — SODIUM CHLORIDE 0.9 % IV BOLUS (SEPSIS)
500.0000 mL | Freq: Once | INTRAVENOUS | Status: AC
  Administered 2017-08-21: 500 mL via INTRAVENOUS

## 2017-08-21 MED ORDER — LORATADINE 10 MG PO TABS: 10.0000 mg | ORAL_TABLET | Freq: Every day | ORAL | Status: DC

## 2017-08-21 MED ORDER — ONDANSETRON HCL 4 MG/2ML IJ SOLN
4.0000 mg | Freq: Once | INTRAMUSCULAR | Status: AC
  Administered 2017-08-21: 4 mg via INTRAVENOUS
  Filled 2017-08-21: qty 2

## 2017-08-21 MED ORDER — CANAGLIFLOZIN 100 MG PO TABS: 300.0000 mg | ORAL_TABLET | Freq: Every day | ORAL | Status: DC

## 2017-08-21 MED ORDER — VITAMIN D 50 MCG (2000 UT) PO CAPS: 2000.0000 [IU] | ORAL_CAPSULE | Freq: Every day | ORAL | Status: DC

## 2017-08-21 MED ORDER — VITAMIN D3 25 MCG (1000 UNIT) PO TABS: 2000.0000 [IU] | ORAL_TABLET | Freq: Every day | ORAL | Status: DC

## 2017-08-21 MED ORDER — AMLODIPINE BESYLATE 10 MG PO TABS: 10.0000 mg | ORAL_TABLET | Freq: Every day | ORAL | Status: DC

## 2017-08-21 MED ORDER — PANTOPRAZOLE SODIUM 40 MG PO TBEC: 40.0000 mg | DELAYED_RELEASE_TABLET | Freq: Every day | ORAL | Status: DC

## 2017-08-21 MED ORDER — PROMETHAZINE HCL 25 MG PO TABS
25.0000 mg | ORAL_TABLET | Freq: Four times a day (QID) | ORAL | Status: DC | PRN
  Filled 2017-08-21: qty 1

## 2017-08-21 MED ORDER — LEVETIRACETAM 500 MG PO TABS
500.0000 mg | ORAL_TABLET | Freq: Two times a day (BID) | ORAL | Status: DC
  Administered 2017-08-21: 500 mg via ORAL
  Filled 2017-08-21: qty 1

## 2017-08-21 MED ORDER — HYOSCYAMINE SULFATE 0.125 MG PO TABS: 0.1250 mg | ORAL_TABLET | Freq: Four times a day (QID) | ORAL | Status: DC | PRN

## 2017-08-21 MED ORDER — SODIUM CHLORIDE 0.9 % IV SOLN
125.00 | INTRAVENOUS | Status: DC
Start: ? — End: 2017-08-21

## 2017-08-21 MED ORDER — OXYCODONE HCL 5 MG PO TABS
5.0000 mg | ORAL_TABLET | ORAL | Status: DC | PRN
  Administered 2017-08-21: 5 mg via ORAL
  Filled 2017-08-21: qty 1

## 2017-08-21 MED ORDER — BARIUM SULFATE 2.1 % PO SUSP: 900.0000 mL | Freq: Once | ORAL | Status: DC

## 2017-08-21 MED ORDER — ATORVASTATIN CALCIUM 40 MG PO TABS: 40.0000 mg | ORAL_TABLET | Freq: Every evening | ORAL | Status: DC

## 2017-08-21 MED ORDER — CYCLOBENZAPRINE HCL 10 MG PO TABS: 10.0000 mg | ORAL_TABLET | Freq: Three times a day (TID) | ORAL | Status: DC | PRN

## 2017-08-21 MED ORDER — DIPHENOXYLATE-ATROPINE 2.5-0.025 MG PO TABS: 1.0000 | ORAL_TABLET | Freq: Four times a day (QID) | ORAL | Status: DC | PRN

## 2017-08-21 NOTE — [facility] Notes (Signed)
PHARMACIST - PHYSICIAN COMMUNICATION CONCERNING:  SGLT2 INHIBITOR SAFE ADMINISTRATION   DESCRIPTION:  After discussion with Dr Maudie Mercury, canagliflozin has been stopped during hospital admission. Canagliflozin should only be restarted after all of the following have been corrected.  Specific contraindications are:  _0  eGFR is below 45 ml/min/1.80m  _1  Acute renal failure  <<[id number]>_0<\/[id number]>_1 Acute metabolic acidosis (including DKA)   _3  Dehydration or volume depletion  _4  NPO status  _5  Urinary Tract Infection   RECOMMENDATION: _6  Resume canagliflozin at discharge if above contraindication has resolved.     _7  Do not resume canagliflozin at discharge.  Consider alternate    anti-diabetic therapy at discharge.    EPeggyann Juba PharmD, BCPS Pharmacy: [phone]/13/2018 9:45 PM

## 2017-08-21 NOTE — ED Triage Notes (Signed)
Patient c/o abd pain with n/v/d for several days. Patient also c/o back pain causing her to not be able to walk and get around very well.  Patient also c/o migraine that started this morning.

## 2017-08-21 NOTE — ED Provider Notes (Signed)
[facility]-EMERGENCY DEPT Provider Note   CSN: [id number] Arrival date & time: 08/21/17  1212     History   Chief Complaint Chief Complaint  Patient presents with  . Abdominal Pain  . Back Pain  . Migraine    HPI Stephanie [name] is a 51 y.o. female w PMHx DM, IBS, small bowel obstruction, intracerebral hemorrhage, nephrolithiasis, hypertension, hepatitis C, sending to the ED for subsequent visit for bilateral flank pain and abdominal pain with nausea/vomiting/diarrhea since 08/16/2017. No medications tried for pain.  Patient was seen on 08/18/2017 in the ED, with CT renal stone study showing 3 mm nonobstructing stone in the upper pole of left kidney, and tiny scattered stones in the right kidney.  Bowels appeared normal.  Patient also with headache during that visit, with a negative head CT.  Patient with UA consistent with infection, and treated with Levaquin for possible pyelonephritis.  She has not followed up with her PCP since that visit.  Presents today for persistent pain, that is not worsening.  Denies fever or chills, dysuria, urinary frequency, or other complaints other than headache.  The history is provided by the patient.    Past Medical History:  Diagnosis Date  . Diabetes mellitus without complication (HCC)   . Diverticulitis   . Headache   . Hepatitis C   . History of kidney stones   . Hypertension   . IBS (irritable bowel syndrome)   . Kidney stones   . Sciatica   . Sleep apnea   . Stroke [facility])     Patient Active Problem List   Diagnosis Date Noted  . Headache 01/07/2017  . Small bowel obstruction (HCC) 09/02/2016  . Pain and swelling of left upper extremity 07/22/2016  . Superficial venous thrombosis of arm, left 07/22/2016  . ICH (intracerebral hemorrhage) (HCC)   . Essential hypertension, malignant 04/19/2016  . Cytotoxic brain edema (HCC) 04/19/2016  . IVH (intraventricular hemorrhage) (HCC) 04/18/2016  . Hepatitis C      Past Surgical History:  Procedure Laterality Date  . ABDOMINAL HYSTERECTOMY    . carpel tunnel syndrome  2017  . HUMERUS IM NAIL Right 03/11/2017   Procedure: INTRAMEDULLARY (IM) NAIL HUMERAL;  Surgeon: Lyndle Herrlich, MD;  Location: [facility] ORS;  Service: Orthopedics;  Laterality: Right;  . KIDNEY STONE SURGERY Right 02/12/12  . KIDNEY STONE SURGERY Left 07/15/2012  . LIGATION OF ARTERIOVENOUS  FISTULA Left 06/21/2016   Procedure: LIGATION OF ARTERIOVENOUS  FISTULA ( LIGATION BASILIC VEIN );  Surgeon: Renford Dills, MD;  Location: [facility] ORS;  Service: Vascular;  Laterality: Left;  . LITHOTRIPSY    . OOPHORECTOMY    . SINUS EXPLORATION    . TONSILLECTOMY      OB History    No data available       Home Medications    Prior to Admission medications   Medication Sig Start Date End Date Taking? Authorizing Provider  alprazolam Prudy Feeler) 2 MG tablet Take 1 mg by mouth 2 (two) times daily.    Yes [provider]  amLODipine (NORVASC) 10 MG tablet Take 10 mg by mouth daily.   Yes [provider]  atorvastatin (LIPITOR) 40 MG tablet Take 40 mg by mouth every evening.   Yes [provider]  Cholecalciferol (VITAMIN D) 2000 units CAPS Take 2,000 Units by mouth daily.   Yes [provider]  clopidogrel (PLAVIX) 75 MG tablet Take 1 tablet (75 mg total) by mouth daily. 06/27/16  Yes Marvel Plan, MD  cyclobenzaprine (FLEXERIL) 10 MG tablet Take 10 mg by mouth 3 (three) times daily as needed for muscle spasms.   Yes [provider]  glipiZIDE (GLUCOTROL) 5 MG tablet Take 1 tablet (5 mg total) by mouth 2 (two) times daily before a meal. 04/30/16  Yes Lora Paula, MD  hydrochlorothiazide (HYDRODIURIL) 25 MG tablet Take 25 mg by mouth daily.  07/01/17  Yes [provider]  INVOKANA 300 MG TABS tablet Take 300 mg by mouth daily before breakfast.    Yes [provider]  levETIRAcetam (KEPPRA) 500 MG tablet Take 1 tablet (500 mg  total) by mouth 2 (two) times daily. 04/30/16  Yes Lora Paula, MD  levofloxacin (LEVAQUIN) 750 MG tablet Take 1 tablet (750 mg total) by mouth daily for 5 days. 08/18/17 08/23/17 Yes Tegeler, Canary Brim, MD  loratadine (CLARITIN) 10 MG tablet Take 10 mg by mouth daily.   Yes [provider]  losartan (COZAAR) 100 MG tablet Take 100 mg by mouth daily.  08/08/17  Yes [provider]  meloxicam (MOBIC) 7.5 MG tablet  05/14/17  Yes [provider]  metoprolol (LOPRESSOR) 50 MG tablet Take 50 mg by mouth 2 (two) times daily.    Yes [provider]  omeprazole (PRILOSEC) 40 MG capsule Take 40 mg by mouth daily.   Yes [provider]  promethazine (PHENERGAN) 25 MG tablet Take 25 mg by mouth every 6 (six) hours as needed for nausea or vomiting.  08/08/17  Yes [provider]  propranolol ER (INDERAL LA) 60 MG 24 hr capsule Take 60 mg by mouth daily.  08/08/17  Yes [provider]  tiZANidine (ZANAFLEX) 4 MG tablet Take 4 mg by mouth daily.  08/08/17  Yes [provider]  zolpidem (AMBIEN) 10 MG tablet Take 10 mg by mouth at bedtime.  08/14/17  Yes [provider]  acetaminophen (TYLENOL) 500 MG tablet Take 1,000 mg by mouth 2 (two) times daily.    [provider]  baclofen (LIORESAL) 10 MG tablet Take 10 mg by mouth 2 (two) times daily as needed for muscle spasms.  06/18/17   [provider]  butalbital-acetaminophen-caffeine (FIORICET, ESGIC) 530 382 0102 MG tablet  05/14/17   [provider]  calcipotriene (DOVONOX) 0.005 % cream  07/17/17   [provider]  carisoprodol (SOMA) 350 MG tablet Take 1 tablet (350 mg total) by mouth 3 (three) times daily as needed. Patient not taking: Reported on 08/18/2017 04/11/17   Myrna Blazer, MD  diphenoxylate-atropine (LOMOTIL) 2.5-0.025 MG tablet Take 1 tablet by mouth 4 (four) times daily as needed for diarrhea or loose stools.    [provider]  DULoxetine (CYMBALTA) 60 MG capsule Take 60 mg by mouth daily.    [provider]  hyoscyamine (LEVSIN, ANASPAZ) 0.125 MG tablet Take 0.125 mg by mouth every 6 (six) hours as needed.  08/08/17   [provider]  levETIRAcetam (KEPPRA) 250 MG tablet  07/18/17   [provider]  losartan (COZAAR) 50 MG tablet Take 50 mg by mouth 2 (two) times daily.    [provider]  ondansetron (ZOFRAN) 4 MG tablet Take 1 tablet (4 mg total) by mouth daily as needed. Patient not taking: Reported on 08/21/2017 04/11/17   Myrna Blazer, MD  oxyCODONE (OXY IR/ROXICODONE) 5 MG immediate release tablet Take 1-2 tablets (5-10 mg total) by mouth every 4 (four) hours as needed for severe pain. Patient  not taking: Reported on 08/18/2017 03/11/17   Lyndle HerrlichBowers, James R, MD  topiramate (TOPAMAX) 25 MG tablet 25mg  bid for 7 days and then 50mg  bid Patient not taking: Reported on 08/18/2017 01/07/17   Marvel PlanXu, Jindong, MD  triamcinolone (KENALOG) 0.147 MG/GM topical spray  07/17/17   [provider]    Family History Family History  Problem Relation Age of Onset  . Heart failure Mother   . Diabetes Father   . Cancer Sister     Social History Social History   Tobacco Use  . Smoking status: Current Every Day Smoker    Packs/day: 0.25    Years: 1.50    Pack years: 0.37    Types: Cigarettes  . Smokeless tobacco: Never Used  Substance Use Topics  . Alcohol use: No    Comment: no alcohol since 2002  . Drug use: No    Comment: electric cig     Allergies   Aspirin; Buprenorphine hcl; Codeine; Compazine; Ioxaglate; Ivp dye [iodinated diagnostic agents]; Metrizamide; Morphine and related; Naproxen; Norco [hydrocodone-acetaminophen]; Penicillin g; Penicillins; Prochlorperazine maleate; Reglan [metoclopramide]; Sulfur; Tegretol [carbamazepine]; Toradol [ketorolac tromethamine]; and Tramadol   Review of Systems Review of Systems  Constitutional: Negative for  chills and fever.  Gastrointestinal: Positive for abdominal pain, diarrhea, nausea and vomiting.  Genitourinary: Positive for flank pain. Negative for dysuria, frequency and hematuria.  Neurological: Positive for headaches.  All other systems reviewed and are negative.    Physical Exam Updated Vital Signs BP (!) 126/100   Pulse 72   Temp 98 F (36.7 C) (Oral)   Resp 15   SpO2 93%   Physical Exam  Constitutional: She appears well-developed and well-nourished. She appears ill.  HENT:  Head: Normocephalic and atraumatic.  Mouth/Throat: Oropharynx is clear and moist.  Eyes: Conjunctivae are normal.  Cardiovascular: Normal rate, regular rhythm, normal heart sounds and intact distal pulses.  Pulmonary/Chest: Effort normal and breath sounds normal.  Abdominal: Soft. Normal appearance and bowel sounds are normal. There is generalized tenderness. There is CVA tenderness (b/l). There is no rigidity and no guarding.  Neurological: She is alert.  Skin: Skin is warm. There is pallor.  Psychiatric: She has a normal mood and affect. Her behavior is normal.  Nursing note and vitals reviewed.    ED Treatments / Results  Labs (all labs ordered are listed, but only abnormal results are displayed) Labs Reviewed  CBC - Abnormal; Notable for the following components:      Result Value   WBC 11.3 (*)    Hemoglobin 15.1 (*)    All other components within normal limits  URINALYSIS, ROUTINE W REFLEX MICROSCOPIC - Abnormal; Notable for the following components:   APPearance HAZY (*)    Glucose, UA 150 (*)    Leukocytes, UA LARGE (*)    Bacteria, UA RARE (*)    Squamous Epithelial / LPF 0-5 (*)    All other components within normal limits  I-STAT BETA HCG BLOOD, ED ([facility], [facility], [facility] ONLY) - Abnormal; Notable for the following components:   I-stat hCG, quantitative 6.1 (*)    All other components within normal limits  LIPASE, BLOOD  COMPREHENSIVE METABOLIC PANEL    EKG  EKG  Interpretation None       Radiology No results found.  Procedures Procedures (including critical care time)  Medications Ordered in ED Medications  cefTRIAXone (ROCEPHIN) 1 g in dextrose 5 % 50 mL IVPB (not administered)  Barium Sulfate 2.1 % SUSP 900 mL (not  administered)  HYDROmorphone (DILAUDID) injection 1 mg (not administered)  sodium chloride 0.9 % bolus 500 mL (not administered)  HYDROmorphone (DILAUDID) injection 1 mg (1 mg Intravenous Given 08/21/17 1408)  sodium chloride 0.9 % bolus 500 mL (500 mLs Intravenous New Bag/Given 08/21/17 1408)  ondansetron (ZOFRAN) injection 4 mg (4 mg Intravenous Given 08/21/17 1408)     Initial Impression / Assessment and Plan / ED Course  I have reviewed the triage vital signs and the nursing notes.  Pertinent labs & imaging results that were available during my care of the patient were reviewed by me and considered in my medical decision making (see chart for details).     Patient presenting for persistent abdominal, flank pain, nausea and vomiting.  Patient taking 3 days of Levaquin for pyelonephritis, however without relief of symptoms. Afebrile, ill-appearing.  Generalized abdominal tenderness on exam with b/l CVA tenderness. UA today without much change from 3 days ago, despite antibiotics.  White blood cell count slightly improved from 11.8 to 11.3.  Normal creatinine.  Pain treated and managed in ED with Dilaudid, nausea managed with Zofran.  IV fluids given. Rocephin administered.  CT abdomen pelvis ordered with oral contrast, to rule out acute intra-abdominal pathology.  Care assumed by Audry Piliyler Mohr, PA-C, pending CT results. Anticipate admission for pyelonephritis with failed outpatient treatment.    Patient discussed with and seen by Dr. Adriana Simasook.  Final Clinical Impressions(s) / ED Diagnoses   Final diagnoses:  Pyelonephritis    ED Discharge Orders    None       Anael Rosch, [place], PA-C 08/21/17 1559    Donnetta Hutchingook, Brian,  MD 08/22/17 1329

## 2017-08-21 NOTE — ED Notes (Signed)
Patient used restroom before coming to hospital will call for bathroom assistance to get urine sample

## 2017-08-21 NOTE — H&P (Signed)
TRH H&P   Patient Demographics:    Stephanie [name], is a 51 y.o. female  MRN: [id number]   DOB - 08-06-1966  Admit Date - 08/21/2017  Outpatient Primary MD for the patient is Marisue IvanLinthavong, Kanhka, MD  Referring MD/NP/PA:  [place] Robinson  Outpatient Specialists:  Dr. Sherryll BurgerShah (neurology), [facility] Patient coming from: home  Chief Complaint  Patient presents with  . Abdominal Pain  . Back Pain  . Migraine      HPI:    Stephanie [name]  is a 51 y.o. female, w OSA, hypertension, Dm2, CVA, Hep C, nephrolithiasis apparently c/o bilateral flank pain, and n/v.  Pt was seen on 12/10 and dx with UTI.  And sent out with oral abx.  Pt notes that due to n/v, unable to keep Po Levaquin down. Urine culture pending.    In Ed,  CT abd/ pelvis IMPRESSION: No acute find to explain the clinical presentation.  Bilateral renal calculi but without evidence of hydronephrosis or passing stone. Chronic scarring of the right kidney.  Chronic degenerative spondylosis L5-S1.  lipase 23,  Na 141, K 4.0 Bun 15, Creatinine 0.68 Ast 16, Alt 16  ua wbc tntc.   Pt will be admitted for UTI and n/v.      Review of systems:    In addition to the HPI above,  No Fever-chills, No Headache, No changes with Vision or hearing, No problems swallowing food or Liquids, No Chest pain, Cough or Shortness of Breath, Bowel movements are regular, No Blood in stool or Urine, No dysuria, No new skin rashes or bruises, No new joints pains-aches,  No new weakness, tingling, numbness in any extremity, No recent weight gain or loss, No polyuria, polydypsia or polyphagia, No significant Mental Stressors.  A full 10 point Review of Systems was done, except as stated above, all other Review of Systems were negative.   With Past History of the following :    Past Medical History:  Diagnosis Date  .  Diabetes mellitus without complication (HCC)   . Diverticulitis   . Headache   . Hepatitis C   . History of kidney stones   . Hypertension   . IBS (irritable bowel syndrome)   . Kidney stones   . Sciatica   . Sleep apnea    on cpap  . Stroke [facility](HCC)       Past Surgical History:  Procedure Laterality Date  . ABDOMINAL HYSTERECTOMY    . carpel tunnel syndrome  2017  . HUMERUS IM NAIL Right 03/11/2017   Procedure: INTRAMEDULLARY (IM) NAIL HUMERAL;  Surgeon: Lyndle HerrlichBowers, Skylen Spiering R, MD;  Location: [facility] ORS;  Service: Orthopedics;  Laterality: Right;  . KIDNEY STONE SURGERY Right 02/12/12  . KIDNEY STONE SURGERY Left 07/15/2012  . LIGATION OF ARTERIOVENOUS  FISTULA Left 06/21/2016   Procedure: LIGATION OF ARTERIOVENOUS  FISTULA ( LIGATION BASILIC VEIN );  Surgeon: Renford DillsGregory G Schnier, MD;  Location: [facility] ORS;  Service: Vascular;  Laterality: Left;  . LITHOTRIPSY    . OOPHORECTOMY    . SINUS EXPLORATION    . TONSILLECTOMY        Social History:     Social History   Tobacco Use  . Smoking status: Current Every Day Smoker    Packs/day: 0.25    Years: 1.50    Pack years: 0.37    Types: Cigarettes  . Smokeless tobacco: Never Used  Substance Use Topics  . Alcohol use: No    Comment: no alcohol since 2002     Lives - at home  Mobility - walks by self   Family History :     Family History  Problem Relation Age of Onset  . Heart failure Mother   . Diabetes Father   . Cancer Sister       Home Medications:   Prior to Admission medications   Medication Sig Start Date End Date Taking? Authorizing Provider  alprazolam Prudy Feeler(XANAX) 2 MG tablet Take 1 mg by mouth 2 (two) times daily.    Yes [provider]  amLODipine (NORVASC) 10 MG tablet Take 10 mg by mouth daily.   Yes [provider]  atorvastatin (LIPITOR) 40 MG tablet Take 40 mg by mouth every evening.   Yes [provider]  Cholecalciferol (VITAMIN D) 2000 units CAPS Take 2,000 Units by mouth daily.    Yes [provider]  clopidogrel (PLAVIX) 75 MG tablet Take 1 tablet (75 mg total) by mouth daily. 06/27/16  Yes Marvel PlanXu, Jindong, MD  cyclobenzaprine (FLEXERIL) 10 MG tablet Take 10 mg by mouth 3 (three) times daily as needed for muscle spasms.   Yes [provider]  glipiZIDE (GLUCOTROL) 5 MG tablet Take 1 tablet (5 mg total) by mouth 2 (two) times daily before a meal. 04/30/16  Yes Lora PaulaKrall, Jennifer T, MD  hydrochlorothiazide (HYDRODIURIL) 25 MG tablet Take 25 mg by mouth daily.  07/01/17  Yes [provider]  INVOKANA 300 MG TABS tablet Take 300 mg by mouth daily before breakfast.    Yes [provider]  levETIRAcetam (KEPPRA) 500 MG tablet Take 1 tablet (500 mg total) by mouth 2 (two) times daily. 04/30/16  Yes Lora PaulaKrall, Jennifer T, MD  levofloxacin (LEVAQUIN) 750 MG tablet Take 1 tablet (750 mg total) by mouth daily for 5 days. 08/18/17 08/23/17 Yes Tegeler, Canary Brimhristopher J, MD  loratadine (CLARITIN) 10 MG tablet Take 10 mg by mouth daily.   Yes [provider]  losartan (COZAAR) 100 MG tablet Take 100 mg by mouth daily.  08/08/17  Yes [provider]  meloxicam (MOBIC) 7.5 MG tablet  05/14/17  Yes [provider]  metoprolol (LOPRESSOR) 50 MG tablet Take 50 mg by mouth 2 (two) times daily.    Yes [provider]  omeprazole (PRILOSEC) 40 MG capsule Take 40 mg by mouth daily.   Yes [provider]  promethazine (PHENERGAN) 25 MG tablet Take 25 mg by mouth every 6 (six) hours as needed for nausea or vomiting.  08/08/17  Yes [provider]  propranolol ER (INDERAL LA) 60 MG 24 hr capsule Take 60 mg by mouth daily.  08/08/17  Yes [provider]  tiZANidine (ZANAFLEX) 4 MG tablet Take 4 mg by mouth daily.  08/08/17  Yes [provider]  zolpidem (AMBIEN) 10 MG tablet Take 10 mg by mouth at bedtime.  08/14/17  Yes [provider]  acetaminophen (TYLENOL) 500 MG tablet Take 1,000 mg by mouth 2 (two)  times daily.    [provider]  baclofen (LIORESAL) 10 MG tablet Take 10 mg by mouth 2 (two) times daily as needed for muscle spasms.  06/18/17   [provider]  butalbital-acetaminophen-caffeine (FIORICET, ESGIC) [phone] MG tablet  05/14/17   [provider]  calcipotriene (DOVONOX) 0.005 % cream  07/17/17   [provider]  carisoprodol (SOMA) 350 MG tablet Take 1 tablet (350 mg total) by mouth 3 (three) times daily as needed. Patient not taking: Reported on 08/18/2017 04/11/17   Myrna Blazer, MD  diphenoxylate-atropine (LOMOTIL) 2.5-0.025 MG tablet Take 1 tablet by mouth 4 (four) times daily as needed for diarrhea or loose stools.    [provider]  DULoxetine (CYMBALTA) 60 MG capsule Take 60 mg by mouth daily.    [provider]  hyoscyamine (LEVSIN, ANASPAZ) 0.125 MG tablet Take 0.125 mg by mouth every 6 (six) hours as needed.  08/08/17   [provider]  levETIRAcetam (KEPPRA) 250 MG tablet  07/18/17   [provider]  losartan (COZAAR) 50 MG tablet Take 50 mg by mouth 2 (two) times daily.    [provider]  ondansetron (ZOFRAN) 4 MG tablet Take 1 tablet (4 mg total) by mouth daily as needed. Patient not taking: Reported on 08/21/2017 04/11/17   Myrna Blazer, MD  oxyCODONE (OXY IR/ROXICODONE) 5 MG immediate release tablet Take 1-2 tablets (5-10 mg total) by mouth every 4 (four) hours as needed for severe pain. Patient not taking: Reported on 08/18/2017 03/11/17   Lyndle Herrlich, MD  topiramate (TOPAMAX) 25 MG tablet 25mg  bid for 7 days and then 50mg  bid Patient not taking: Reported on 08/18/2017 01/07/17   Marvel Plan, MD  triamcinolone (KENALOG) 0.147 MG/GM topical spray  07/17/17   [provider]     Allergies:     Allergies  Allergen Reactions  . Aspirin Itching  . Buprenorphine Hcl Itching  . Codeine Itching and Other (See Comments)    Makes her feel like something is  crawling under her skin. Can take, sometimes makes her itch  . Compazine Itching    "makes my skin crawl"  . Ioxaglate Itching  . Ivp Dye [Iodinated Diagnostic Agents] Itching    Makes her itch really bad. Had to get 2-3 shots of Benadryl when she was in the hospital.  . Metrizamide Itching  . Morphine And Related Itching    Feels like something is underneath her skin crawling   . Naproxen Itching  . Norco [Hydrocodone-Acetaminophen] Itching  . Penicillin G Hives and Rash    Has patient had a PCN reaction causing immediate rash, facial/tongue/throat swelling, SOB or lightheadedness with hypotension: Yes Has patient had a PCN reaction causing severe rash involving mucus membranes or skin necrosis: No Has patient had a PCN reaction that required hospitalization: No Has patient had a PCN reaction occurring within the last 10 years: No If all of the above answers are "NO", then may proceed with Cephalosporin use.   Marland Kitchen Penicillins Itching and Other (See Comments)    Has patient had a PCN reaction causing immediate rash, facial/tongue/throat swelling, SOB or lightheadedness with hypotension: Yes Has patient had a PCN reaction causing severe rash involving mucus membranes or skin necrosis: No Has patient had a PCN reaction that required hospitalization: No Has patient had a PCN reaction occurring within the last 10 years: No If all of the  above answers are "NO", then may proceed with Cephalosporin use.   Marland Kitchen Prochlorperazine Maleate Other (See Comments)    Compazine makes her skin crawl - needs 2-3 shots of Benadryl to get relief.  . Reglan [Metoclopramide] Itching and Other (See Comments)    Makes her skin crawl  . Sulfur Hives and Rash    Other reaction(s): Skin Rashes, Hives  . Tegretol [Carbamazepine] Itching and Other (See Comments)    Makes her feel like something is crawling under her skin.  . Toradol [Ketorolac Tromethamine] Itching  . Tramadol Itching     Physical Exam:    Vitals  Blood pressure 113/80, pulse 65, temperature 98 F (36.7 C), temperature source Oral, resp. rate 16, SpO2 97 %.   1. General  lying in bed in NAD,   2. Normal affect and insight, Not Suicidal or Homicidal, Awake Alert, Oriented X 3.  3. No F.N deficits, ALL C.Nerves Intact, Strength 5/5 all 4 extremities, Sensation intact all 4 extremities, Plantars down going.  4. Ears and Eyes appear Normal, Conjunctivae clear, PERRLA. Moist Oral Mucosa.  5. Supple Neck, No JVD, No cervical lymphadenopathy appriciated, No Carotid Bruits.  6. Symmetrical Chest wall movement, Good air movement bilaterally, CTAB.  7. RRR, No Gallops, Rubs or Murmurs, No Parasternal Heave.  8. Positive Bowel Sounds, Abdomen Soft, No tenderness, No organomegaly appriciated,No rebound -guarding or rigidity.  9.  No Cyanosis, Normal Skin Turgor, No Skin Rash or Bruise.  10. Good muscle tone,  joints appear normal , no effusions, Normal ROM.  11. No Palpable Lymph Nodes in Neck or Axillae  No cva tenderness   Data Review:    CBC Recent Labs  Lab 08/18/17 1527 08/21/17 1238  WBC 11.8* 11.3*  HGB 14.8 15.1*  HCT 44.7 44.5  PLT 266 247  MCV 95.3 95.1  MCH 31.6 32.3  MCHC 33.1 33.9  RDW 13.7 13.6   ------------------------------------------------------------------------------------------------------------------  Chemistries  Recent Labs  Lab 08/18/17 1527 08/21/17 1238  NA 141 141  K 4.0 4.0  CL 109 108  CO2 27 25  GLUCOSE 89 67  BUN 12 15  CREATININE 0.61 0.68  CALCIUM 9.5 9.3  AST 18 16  ALT 16 16  ALKPHOS 123 112  BILITOT 0.8 0.7   ------------------------------------------------------------------------------------------------------------------ estimated creatinine clearance is 71.8 mL/min (by C-G formula based on SCr of 0.68 mg/dL). ------------------------------------------------------------------------------------------------------------------ No results for input(s): TSH,  T4TOTAL, T3FREE, THYROIDAB in the last 72 hours.  Invalid input(s): FREET3  Coagulation profile No results for input(s): INR, PROTIME in the last 168 hours. ------------------------------------------------------------------------------------------------------------------- No results for input(s): DDIMER in the last 72 hours. -------------------------------------------------------------------------------------------------------------------  Cardiac Enzymes No results for input(s): CKMB, TROPONINI, MYOGLOBIN in the last 168 hours.  Invalid input(s): CK ------------------------------------------------------------------------------------------------------------------ No results found for: BNP   ---------------------------------------------------------------------------------------------------------------  Urinalysis    Component Value Date/Time   COLORURINE YELLOW 08/21/2017 1405   APPEARANCEUR HAZY (A) 08/21/2017 1405   APPEARANCEUR Clear 03/30/2014 1354   LABSPEC 1.013 08/21/2017 1405   LABSPEC 1.015 03/30/2014 1354   PHURINE 7.0 08/21/2017 1405   GLUCOSEU 150 (A) 08/21/2017 1405   GLUCOSEU Negative 03/30/2014 1354   HGBUR NEGATIVE 08/21/2017 1405   BILIRUBINUR NEGATIVE 08/21/2017 1405   BILIRUBINUR Negative 03/30/2014 1354   KETONESUR NEGATIVE 08/21/2017 1405   PROTEINUR NEGATIVE 08/21/2017 1405   UROBILINOGEN 0.2 02/25/2015 0049   NITRITE NEGATIVE 08/21/2017 1405   LEUKOCYTESUR LARGE (A) 08/21/2017 1405   LEUKOCYTESUR Trace 03/30/2014 1354    ----------------------------------------------------------------------------------------------------------------   Imaging  Results:    Ct Abdomen Pelvis Wo Contrast  Result Date: 08/21/2017 CLINICAL DATA:  Bilateral flank and abdominal pain with nausea, vomiting and diarrhea. EXAM: CT ABDOMEN AND PELVIS WITHOUT CONTRAST TECHNIQUE: Multidetector CT imaging of the abdomen and pelvis was performed following the standard protocol  without IV contrast. COMPARISON:  CT 3 days ago. Multiple prior studies including 09/01/2016 FINDINGS: Lower chest: Mild right middle lobe scarring. Benign 3 mm subpleural nodules posterolateral left lower lobe. No active process. Hepatobiliary: Normal Pancreas: Normal Spleen: Normal Adrenals/Urinary Tract: Right adrenal gland is normal. 11 mm left adrenal adenoma as seen previously. Right kidney shows areas of atrophy with multiple small renal calculi. No hydronephrosis. No passing stone. The left kidney is normal in size. There is a nonobstructing 3 mm stone in the upper pole. No hydronephrosis or passing stone. No stone in the bladder. Stomach/Bowel: No acute bowel finding. Moderate amount of fecal matter throughout the colon. Small bowel appears normal. Vascular/Lymphatic: Aortic atherosclerosis. No aneurysm. IVC is normal. No retroperitoneal adenopathy. Reproductive: Previous hysterectomy.  No pelvic mass. Other: No free fluid or air. Musculoskeletal: Chronic degenerative spondylosis L5-S1 IMPRESSION: No acute find to explain the clinical presentation. Bilateral renal calculi but without evidence of hydronephrosis or passing stone. Chronic scarring of the right kidney. Chronic degenerative spondylosis L5-S1. Electronically Signed   By: Paulina Fusi M.D.   On: 08/21/2017 18:11      Assessment & Plan:    Principal Problem:   UTI (urinary tract infection) Active Problems:   Nausea & vomiting   Nicotine dependence    UTI Awaiting urine culture Blood culture x2 Rocephin 1gm iv qday  N/v  ? gastroparesis Phenergan protonix   Nicotine dep Nicotine patch prn    Dm2 fsbs ac and qhs, ISS Cont invokana Cont glipizide  Hypertension Cont losartan Cont hydrochlorothiazide  Seizure do Cont keppra  Gerd PPI  Anxiety Cont cymbalta Cont xanax prn   DVT Prophylaxis Lovenox - SCDs   AM Labs Ordered, also please review Full Orders  Family Communication: Admission, patients condition  and plan of care including tests being ordered have been discussed with the patient who indicate understanding and agree with the plan and Code Status.  Code Status FULL CODE  Likely DC to  home  Condition GUARDED    Consults called:  none  Admission status: obwservation  Time spent in minutes : 45   Pearson Grippe M.D on 08/21/2017 at 6:45 PM  Between 7am to 7pm - Pager - [phone] . After 7pm go to [email or web address] - password [facility]  [facility] - Office  [phone]

## 2017-08-21 NOTE — ED Provider Notes (Signed)
  Physical Exam  BP (!) 126/100   Pulse 72   Temp 98 F (36.7 C) (Oral)   Resp 15   SpO2 93%   Physical Exam  ED Course/Procedures     Procedures  MDM   3:13 PM- Sign out from Stephanie [name], [place]PA-C  Per previous provider HPI 51 y.o. female w PMHx DM, IBS, small bowel obstruction, intracerebral hemorrhage, nephrolithiasis, hypertension, hepatitis C, sending to the ED for subsequent visit for bilateral flank pain and abdominal pain with nausea/vomiting/diarrhea since 08/16/2017.  Patient was seen on 08/18/2017 in the ED, with CT renal stone study showing 3 mm nonobstructing stone in the upper pole of left kidney, and tiny scattered stones in the right kidney.  Bowels appeared normal.  Patient also with headache during that visit, with a negative head CT.  Patient with UA consistent with infection, and treated with Levaquin for possible pyelonephritis.  She has not followed up with her PCP since that visit.  Presents today for persistent pain, that is not worsening.  Denies fever or chills, dysuria, urinary frequency, or other complaints other than headache.   Pending CT Abdomen/Pelvis. If unremarkable, will likely admit due to Pyelonephritis. Pt was on outpatient ABX and continues to be symptomatic from previous ED visit. Urine culture has not resulted. Continues to be symptomatic. Pt with significant commodities. Pt was given Rocephin in ED. Attending from previous provider has seen patient and recommends admission.     Lab work with WBC 11.3. UA with evidence of UTI. CMP unremarkable.   Temp 35F HR 85 BP 150/99   6:14 PM CT Abdomen/Pelvis IMPRESSION:  No acute find to explain the clinical presentation.    Bilateral renal calculi but without evidence of hydronephrosis or  passing stone. Chronic scarring of the right kidney.    Chronic degenerative spondylosis L5-S1.   Will admit to medicine   Stephanie [name], Stephanie Balling, PA-C 08/21/17 1833    Donnetta Hutchingook, Brian, MD 08/22/17 [phone]

## 2017-08-22 DIAGNOSIS — N3000 Acute cystitis without hematuria: Secondary | ICD-10-CM

## 2017-08-22 LAB — COMPREHENSIVE METABOLIC PANEL
ALT: 14 U/L (ref 14–54)
AST: 16 U/L (ref 15–41)
Albumin: 3.5 g/dL (ref 3.5–5.0)
Alkaline Phosphatase: 95 U/L (ref 38–126)
Anion gap: 7 (ref 5–15)
BILIRUBIN TOTAL: 0.8 mg/dL (ref 0.3–1.2)
BUN: 12 mg/dL (ref 6–20)
CHLORIDE: 110 mmol/L (ref 101–111)
CO2: 24 mmol/L (ref 22–32)
CREATININE: 0.56 mg/dL (ref 0.44–1.00)
Calcium: 9 mg/dL (ref 8.9–10.3)
Glucose, Bld: 90 mg/dL (ref 65–99)
POTASSIUM: 4.2 mmol/L (ref 3.5–5.1)
Sodium: 141 mmol/L (ref 135–145)
TOTAL PROTEIN: 6.5 g/dL (ref 6.5–8.1)

## 2017-08-22 LAB — CBC
HCT: 42.1 % (ref 36.0–46.0)
Hemoglobin: 13.6 g/dL (ref 12.0–15.0)
MCH: 31.4 pg (ref 26.0–34.0)
MCHC: 32.3 g/dL (ref 30.0–36.0)
MCV: 97.2 fL (ref 78.0–100.0)
PLATELETS: 129 10*3/uL — AB (ref 150–400)
RBC: 4.33 MIL/uL (ref 3.87–5.11)
RDW: 13.6 % (ref 11.5–15.5)
WBC: 8.8 10*3/uL (ref 4.0–10.5)

## 2017-08-22 LAB — HIV ANTIBODY (ROUTINE TESTING W REFLEX): HIV SCREEN 4TH GENERATION: NONREACTIVE

## 2017-08-22 NOTE — Discharge Summary (Signed)
[facility]  Physician Discharge Summary   [name]: Stephanie [name] MRN: [id number] DOB/AGE: 622/05/1966 51 y.o.  Admit date: 08/21/2017 Discharge date: 08/22/2017  PCP: Marisue IvanLinthavong, Kanhka, MD  DISCHARGE DIAGNOSES:  Principal Problem:   UTI (urinary tract infection) Active Problems:   Nausea & vomiting   Nicotine dependence    PATIENT LEFT AGAINST MEDICAL ADVICE   INITIAL HISTORY: 51 year old female with past medical history of sleep apnea, hypertension, diabetes, stroke, nephrolithiasis, presented with flank pain nausea vomiting.  Recently diagnosed with UTI and was prescribed oral antibiotics.  Due to persistent of her symptoms she presented back to the emergency department.  She was hospitalized.  Imaging studies did not show any hydronephrosis.   HOSPITAL COURSE:   Patient was hospitalized for further management of her urinary tract infection.  She was placed on intravenous ceftriaxone.  She was given symptomatic treatment for her nausea vomiting and pain.  During the course of the night patient became upset for reasons that are not entirely clear.  I was paged by the nurse this morning stating that the patient wanted to leave AGAINST MEDICAL ADVICE.  Patient was very upset.  I tried to convince the patient to stay in the hospital however she does not found to listen to me.  She already has anxiolytics as well as narcotics ordered for symptoms.  She is awake and alert.  She is oriented x3.  She understands that her condition could get worse by leaving the hospital.  She still wants to leave.  Subsequently patient left AGAINST MEDICAL ADVICE.    PERTINENT LABS:  The results of significant diagnostics from this hospitalization (including imaging, microbiology, ancillary and laboratory) are listed below for reference.      Labs: Basic Metabolic Panel: Recent Labs  Lab 08/18/17 1527 08/21/17 1238 08/22/17 0635  NA 141 141 141  K 4.0 4.0 4.2  CL  109 108 110  CO2 27 25 24   GLUCOSE 89 67 90  BUN 12 15 12   CREATININE 0.61 0.68 0.56  CALCIUM 9.5 9.3 9.0   Liver Function Tests: Recent Labs  Lab 08/18/17 1527 08/21/17 1238 08/22/17 0635  AST 18 16 16   ALT 16 16 14   ALKPHOS 123 112 95  BILITOT 0.8 0.7 0.8  PROT 7.3 7.3 6.5  ALBUMIN 4.0 3.8 3.5   Recent Labs  Lab 08/18/17 1527 08/21/17 1238  LIPASE 28 23   CBC: Recent Labs  Lab 08/18/17 1527 08/21/17 1238 08/22/17 0635  WBC 11.8* 11.3* 8.8  HGB 14.8 15.1* 13.6  HCT 44.7 44.5 42.1  MCV 95.3 95.1 97.2  PLT 266 247 129*     IMAGING STUDIES Ct Abdomen Pelvis Wo Contrast  Result Date: 08/21/2017 CLINICAL DATA:  Bilateral flank and abdominal pain with nausea, vomiting and diarrhea. EXAM: CT ABDOMEN AND PELVIS WITHOUT CONTRAST TECHNIQUE: Multidetector CT imaging of the abdomen and pelvis was performed following the standard protocol without IV contrast. COMPARISON:  CT 3 days ago. Multiple prior studies including 09/01/2016 FINDINGS: Lower chest: Mild right middle lobe scarring. Benign 3 mm subpleural nodules posterolateral left lower lobe. No active process. Hepatobiliary: Normal Pancreas: Normal Spleen: Normal Adrenals/Urinary Tract: Right adrenal gland is normal. 11 mm left adrenal adenoma as seen previously. Right kidney shows areas of atrophy with multiple small renal calculi. No hydronephrosis. No passing stone. The left kidney is normal in size. There is a nonobstructing 3 mm stone in the upper pole. No hydronephrosis or passing stone. No stone in the bladder. Stomach/Bowel:  No acute bowel finding. Moderate amount of fecal matter throughout the colon. Small bowel appears normal. Vascular/Lymphatic: Aortic atherosclerosis. No aneurysm. IVC is normal. No retroperitoneal adenopathy. Reproductive: Previous hysterectomy.  No pelvic mass. Other: No free fluid or air. Musculoskeletal: Chronic degenerative spondylosis L5-S1 IMPRESSION: No acute find to explain the clinical  presentation. Bilateral renal calculi but without evidence of hydronephrosis or passing stone. Chronic scarring of the right kidney. Chronic degenerative spondylosis L5-S1. Electronically Signed   By: Paulina FusiMark  Shogry M.D.   On: 08/21/2017 18:11       PATIENT LEFT AGAINST MEDICAL ADVICE  Stephanie [name]  [facility] Pager [phone]  08/22/2017, 1:05 PM

## 2017-08-22 NOTE — Progress Notes (Signed)
Husband came and discussed what his wife's plan of care is and wants her to stay here to receive antibiotics as planned. His number if he needs to be reached is [phone] -- his name is Jorja Loaim

## 2017-08-22 NOTE — Progress Notes (Deleted)
[facility]  Physician Discharge Summary   [name]: Stephanie GuerinDianna B Vides MRN: [id number] DOB/AGE: Mar 20, 1966 51 y.o.  Admit date: 08/21/2017 Discharge date: 08/22/2017  PCP: Marisue IvanLinthavong, Kanhka, MD  DISCHARGE DIAGNOSES:  Principal Problem:   UTI (urinary tract infection) Active Problems:   Nausea & vomiting   Nicotine dependence   PATIENT LEFT AGAINST MEDICAL ADVICE   INITIAL HISTORY: 51 year old female with past medical history of sleep apnea, hypertension, diabetes, stroke, nephrolithiasis, presented with flank pain nausea vomiting.  Recently diagnosed with UTI and was prescribed oral antibiotics.  Due to persistent of her symptoms she presented back to the emergency department.  She was hospitalized.  Imaging studies did not show any hydronephrosis.   HOSPITAL COURSE:   Patient was hospitalized for further management of her urinary tract infection.  She was placed on intravenous ceftriaxone.  She was given symptomatic treatment for her nausea vomiting and pain.  During the course of the night patient became upset for reasons that are not entirely clear.  I was paged by the nurse this morning stating that the patient wanted to leave AGAINST MEDICAL ADVICE.  Patient was very upset.  I tried to convince the patient to stay in the hospital however she does not found to listen to me.  She already has anxiolytics as well as narcotics ordered for symptoms.  She is awake and alert.  She is oriented x3.  She understands that her condition could get worse by leaving the hospital.  She still wants to leave.  Subsequently patient left AGAINST MEDICAL ADVICE.    PERTINENT LABS:  The results of significant diagnostics from this hospitalization (including imaging, microbiology, ancillary and laboratory) are listed below for reference.     Labs: Basic Metabolic Panel: Recent Labs  Lab 08/18/17 1527 08/21/17 1238 08/22/17 0635  NA 141 141 141  K 4.0 4.0 4.2  CL 109 108 110    CO2 27 25 24   GLUCOSE 89 67 90  BUN 12 15 12   CREATININE 0.61 0.68 0.56  CALCIUM 9.5 9.3 9.0   Liver Function Tests: Recent Labs  Lab 08/18/17 1527 08/21/17 1238 08/22/17 0635  AST 18 16 16   ALT 16 16 14   ALKPHOS 123 112 95  BILITOT 0.8 0.7 0.8  PROT 7.3 7.3 6.5  ALBUMIN 4.0 3.8 3.5   Recent Labs  Lab 08/18/17 1527 08/21/17 1238  LIPASE 28 23   No results for input(s): AMMONIA in the last 168 hours. CBC: Recent Labs  Lab 08/18/17 1527 08/21/17 1238 08/22/17 0635  WBC 11.8* 11.3* 8.8  HGB 14.8 15.1* 13.6  HCT 44.7 44.5 42.1  MCV 95.3 95.1 97.2  PLT 266 247 129*     IMAGING STUDIES Ct Abdomen Pelvis Wo Contrast  Result Date: 08/21/2017 CLINICAL DATA:  Bilateral flank and abdominal pain with nausea, vomiting and diarrhea. EXAM: CT ABDOMEN AND PELVIS WITHOUT CONTRAST TECHNIQUE: Multidetector CT imaging of the abdomen and pelvis was performed following the standard protocol without IV contrast. COMPARISON:  CT 3 days ago. Multiple prior studies including 09/01/2016 FINDINGS: Lower chest: Mild right middle lobe scarring. Benign 3 mm subpleural nodules posterolateral left lower lobe. No active process. Hepatobiliary: Normal Pancreas: Normal Spleen: Normal Adrenals/Urinary Tract: Right adrenal gland is normal. 11 mm left adrenal adenoma as seen previously. Right kidney shows areas of atrophy with multiple small renal calculi. No hydronephrosis. No passing stone. The left kidney is normal in size. There is a nonobstructing 3 mm stone in the upper pole. No hydronephrosis  or passing stone. No stone in the bladder. Stomach/Bowel: No acute bowel finding. Moderate amount of fecal matter throughout the colon. Small bowel appears normal. Vascular/Lymphatic: Aortic atherosclerosis. No aneurysm. IVC is normal. No retroperitoneal adenopathy. Reproductive: Previous hysterectomy.  No pelvic mass. Other: No free fluid or air. Musculoskeletal: Chronic degenerative spondylosis L5-S1 IMPRESSION:  No acute find to explain the clinical presentation. Bilateral renal calculi but without evidence of hydronephrosis or passing stone. Chronic scarring of the right kidney. Chronic degenerative spondylosis L5-S1. Electronically Signed   By: Paulina FusiMark  Shogry M.D.   On: 08/21/2017 18:11    PATIENT LEFT AGAINST MEDICAL ADVICE   Osvaldo ShipperGokul Khadejah Son  [facility] Pager [phone]  08/22/2017, 12:59 PM

## 2017-08-22 NOTE — Progress Notes (Addendum)
Patient repeatedly asking to leave AMA. Husband refuses to leave work to pick her up. MD has been notified to see patient ASAP. C/o nausea and abdominal pain. PRN medications offered but patient refused.

## 2017-08-22 NOTE — Progress Notes (Signed)
Dr Rito EhrlichKrishnan spoke with patient. Patient continued to want to leave AMA. Patient states her dad is coming to pick her up. Patient removed her IV. Patient signed AMA papers. Provided paper scrubs. This RN attained patients medications stored in pharmacy and returned to patient. Patient left unit.

## 2017-08-23 ENCOUNTER — Encounter: Payer: Self-pay | Admitting: Emergency Medicine

## 2017-08-23 ENCOUNTER — Emergency Department
Admission: EM | Admit: 2017-08-23 | Discharge: 2017-08-23 | Disposition: A | Discharge status: Home / Self Care | Payer: No Typology Code available for payment source | Attending: Emergency Medicine | Admitting: Emergency Medicine

## 2017-08-23 ENCOUNTER — Other Ambulatory Visit: Payer: Self-pay

## 2017-08-23 DIAGNOSIS — N390 Urinary tract infection, site not specified: Secondary | ICD-10-CM | POA: Diagnosis not present

## 2017-08-23 DIAGNOSIS — R109 Unspecified abdominal pain: Secondary | ICD-10-CM | POA: Insufficient documentation

## 2017-08-23 DIAGNOSIS — R51 Headache: Secondary | ICD-10-CM

## 2017-08-23 DIAGNOSIS — I10 Essential (primary) hypertension: Secondary | ICD-10-CM | POA: Diagnosis not present

## 2017-08-23 DIAGNOSIS — Z79899 Other long term (current) drug therapy: Secondary | ICD-10-CM | POA: Diagnosis not present

## 2017-08-23 DIAGNOSIS — F17210 Nicotine dependence, cigarettes, uncomplicated: Secondary | ICD-10-CM | POA: Diagnosis not present

## 2017-08-23 DIAGNOSIS — E119 Type 2 diabetes mellitus without complications: Secondary | ICD-10-CM | POA: Insufficient documentation

## 2017-08-23 DIAGNOSIS — Z7984 Long term (current) use of oral hypoglycemic drugs: Secondary | ICD-10-CM | POA: Insufficient documentation

## 2017-08-23 DIAGNOSIS — R519 Headache, unspecified: Secondary | ICD-10-CM

## 2017-08-23 LAB — GLUCOSE, CAPILLARY: GLUCOSE-CAPILLARY: 150 mg/dL — AB (ref 65–99)

## 2017-08-23 LAB — URINALYSIS, COMPLETE (UACMP) WITH MICROSCOPIC
Bacteria, UA: NONE SEEN
Bilirubin Urine: NEGATIVE
Glucose, UA: >=500 mg/dL — AB
HGB URINE DIPSTICK: NEGATIVE
Ketones, ur: NEGATIVE mg/dL
Nitrite: NEGATIVE
PH: 6 (ref 5.0–8.0)
Protein, ur: NEGATIVE mg/dL
SPECIFIC GRAVITY, URINE: 1.018 (ref 1.005–1.030)

## 2017-08-23 LAB — COMPREHENSIVE METABOLIC PANEL
ALBUMIN: 3.8 g/dL (ref 3.5–5.0)
ALK PHOS: 105 U/L (ref 38–126)
ALT: 17 U/L (ref 14–54)
ANION GAP: 9 (ref 5–15)
AST: 23 U/L (ref 15–41)
BILIRUBIN TOTAL: 0.6 mg/dL (ref 0.3–1.2)
BUN: 12 mg/dL (ref 6–20)
CALCIUM: 9.4 mg/dL (ref 8.9–10.3)
CO2: 22 mmol/L (ref 22–32)
Chloride: 109 mmol/L (ref 101–111)
Creatinine, Ser: 0.75 mg/dL (ref 0.44–1.00)
GFR calc Af Amer: >60 mL/min (ref >60)
GFR calc non Af Amer: >60 mL/min (ref >60)
GLUCOSE: 158 mg/dL — AB (ref 65–99)
Potassium: 3.8 mmol/L (ref 3.5–5.1)
SODIUM: 140 mmol/L (ref 135–145)
TOTAL PROTEIN: 7.3 g/dL (ref 6.5–8.1)

## 2017-08-23 LAB — CBC
HCT: 44.6 % (ref 35.0–47.0)
HEMOGLOBIN: 15.3 g/dL (ref 12.0–16.0)
MCH: 31.7 pg (ref 26.0–34.0)
MCHC: 34.2 g/dL (ref 32.0–36.0)
MCV: 92.8 fL (ref 80.0–100.0)
Platelets: 209 10*3/uL (ref 150–440)
RBC: 4.81 MIL/uL (ref 3.80–5.20)
RDW: 13.9 % (ref 11.5–14.5)
WBC: 9.7 10*3/uL (ref 3.6–11.0)

## 2017-08-23 LAB — POCT PREGNANCY, URINE: Preg Test, Ur: NEGATIVE

## 2017-08-23 LAB — LIPASE, BLOOD: Lipase: 29 U/L (ref 11–51)

## 2017-08-23 MED ORDER — CEFTRIAXONE SODIUM 1 G IJ SOLR
INTRAMUSCULAR | Status: AC
  Filled 2017-08-23: qty 10

## 2017-08-23 MED ORDER — SODIUM CHLORIDE 0.9 % IV BOLUS (SEPSIS)
1000.0000 mL | Freq: Once | INTRAVENOUS | Status: AC
  Administered 2017-08-23: 1000 mL via INTRAVENOUS

## 2017-08-23 MED ORDER — CEFTRIAXONE SODIUM IN DEXTROSE 20 MG/ML IV SOLN
1.0000 g | Freq: Once | INTRAVENOUS | Status: AC
  Administered 2017-08-23: 1 g via INTRAVENOUS

## 2017-08-23 MED ORDER — PHENAZOPYRIDINE HCL 200 MG PO TABS
200.0000 mg | ORAL_TABLET | Freq: Once | ORAL | Status: AC
  Administered 2017-08-23: 200 mg via ORAL
  Filled 2017-08-23: qty 1

## 2017-08-23 MED ORDER — MAGNESIUM SULFATE IN D5W 1-5 GM/100ML-% IV SOLN
1.0000 g | Freq: Once | INTRAVENOUS | Status: DC
  Filled 2017-08-23: qty 100

## 2017-08-23 NOTE — ED Triage Notes (Addendum)
Pt reports feeling bad for 1 week; has been seen [facility] and [facility] once in the last 5 days; diagnosed 2 days ago with Pyelonephritis; taking levaquin for same and has almost finished; pt noted to be passing malodorous gas in triage; reports twisting pain to right side of her abd; also c/o migraine; pt diabetic; says her arm sensor is reading low; when she placed the monitor on the arm sensor the monitor reads "sensor ended; start a new sensor"; no reading or no "low" reading shown;

## 2017-08-23 NOTE — ED Provider Notes (Signed)
[facility] Emergency Department Provider Note    ____________________________________________   I have reviewed the triage vital signs and the nursing notes.   HISTORY  Chief Complaint Migraine and Abdominal Pain   History limited by: Not Limited   HPI Stephanie [name] is a 51 y.o. female who presents to the emergency department today because of concern for migraine and abdominal pain.  LOCATION:generalized headache, right sided abdominal pain DURATION:1 week TIMING: constant SEVERITY: severe QUALITY: sharp abdominal pain CONTEXT: Patient left from [facility] hospital yesterday AMA after admission for urinary tract infection. States that the pain she is feeling in her stomach is slightly different from the pain she had when admitted. At that time it was primarily in the center, now it is more on the right side. She states she has been taking her home antibiotics.  MODIFYING FACTORS: excedrin did not help her headache ASSOCIATED SYMPTOMS: denies any fevers  Per medical record review patient has a history of recent admission and leaving AMA. Had CT abd/pel done 2 days ago without acute findings.   Past Medical History:  Diagnosis Date  . Diabetes mellitus without complication (HCC)   . Diverticulitis   . Headache   . Hepatitis C   . History of kidney stones   . Hypertension   . IBS (irritable bowel syndrome)   . Kidney stones   . Sciatica   . Sleep apnea    on cpap  . Stroke [facility])     Patient Active Problem List   Diagnosis Date Noted  . UTI (urinary tract infection) 08/21/2017  . Nausea & vomiting 08/21/2017  . Nicotine dependence 08/21/2017  . Headache 01/07/2017  . Small bowel obstruction (HCC) 09/02/2016  . Pain and swelling of left upper extremity 07/22/2016  . Superficial venous thrombosis of arm, left 07/22/2016  . ICH (intracerebral hemorrhage) (HCC)   . Essential hypertension, malignant 04/19/2016  . Cytotoxic brain edema  (HCC) 04/19/2016  . IVH (intraventricular hemorrhage) (HCC) 04/18/2016  . Hepatitis C     Past Surgical History:  Procedure Laterality Date  . ABDOMINAL HYSTERECTOMY    . carpel tunnel syndrome  2017  . HUMERUS IM NAIL Right 03/11/2017   Procedure: INTRAMEDULLARY (IM) NAIL HUMERAL;  Surgeon: Lyndle Herrlich, MD;  Location: [facility] ORS;  Service: Orthopedics;  Laterality: Right;  . KIDNEY STONE SURGERY Right 02/12/12  . KIDNEY STONE SURGERY Left 07/15/2012  . LIGATION OF ARTERIOVENOUS  FISTULA Left 06/21/2016   Procedure: LIGATION OF ARTERIOVENOUS  FISTULA ( LIGATION BASILIC VEIN );  Surgeon: Renford Dills, MD;  Location: [facility] ORS;  Service: Vascular;  Laterality: Left;  . LITHOTRIPSY    . OOPHORECTOMY    . SINUS EXPLORATION    . TONSILLECTOMY      Prior to Admission medications   Medication Sig Start Date End Date Taking? Authorizing Provider  acetaminophen (TYLENOL) 500 MG tablet Take 1,000 mg by mouth 2 (two) times daily.    [provider]  alprazolam Prudy Feeler) 2 MG tablet Take 1 mg by mouth 2 (two) times daily.     [provider]  amLODipine (NORVASC) 10 MG tablet Take 10 mg by mouth daily.    [provider]  atorvastatin (LIPITOR) 40 MG tablet Take 40 mg by mouth every evening.    [provider]  baclofen (LIORESAL) 10 MG tablet Take 10 mg by mouth 2 (two) times daily as needed for muscle spasms.  06/18/17   [provider]  butalbital-acetaminophen-caffeine (FIORICET, ESGIC) 50-325-40 MG tablet  05/14/17   [provider]  calcipotriene (DOVONOX) 0.005 % cream  07/17/17   [provider]  carisoprodol (SOMA) 350 MG tablet Take 1 tablet (350 mg total) by mouth 3 (three) times daily as needed. Patient not taking: Reported on 08/18/2017 04/11/17   Myrna BlazerSchaevitz, David Matthew, MD  Cholecalciferol (VITAMIN D) 2000 units CAPS Take 2,000 Units by mouth daily.    [provider]  clopidogrel (PLAVIX) 75 MG tablet Take 1  tablet (75 mg total) by mouth daily. 06/27/16   Marvel PlanXu, Jindong, MD  cyclobenzaprine (FLEXERIL) 10 MG tablet Take 10 mg by mouth 3 (three) times daily as needed for muscle spasms.    [provider]  diphenoxylate-atropine (LOMOTIL) 2.5-0.025 MG tablet Take 1 tablet by mouth 4 (four) times daily as needed for diarrhea or loose stools.    [provider]  DULoxetine (CYMBALTA) 60 MG capsule Take 60 mg by mouth daily.    [provider]  glipiZIDE (GLUCOTROL) 5 MG tablet Take 1 tablet (5 mg total) by mouth 2 (two) times daily before a meal. 04/30/16   Lora PaulaKrall, Jennifer T, MD  hydrochlorothiazide (HYDRODIURIL) 25 MG tablet Take 25 mg by mouth daily.  07/01/17   [provider]  hyoscyamine (LEVSIN, ANASPAZ) 0.125 MG tablet Take 0.125 mg by mouth every 6 (six) hours as needed.  08/08/17   [provider]  INVOKANA 300 MG TABS tablet Take 300 mg by mouth daily before breakfast.     [provider]  levETIRAcetam (KEPPRA) 250 MG tablet  07/18/17   [provider]  levETIRAcetam (KEPPRA) 500 MG tablet Take 1 tablet (500 mg total) by mouth 2 (two) times daily. 04/30/16   Lora PaulaKrall, Jennifer T, MD  levofloxacin (LEVAQUIN) 750 MG tablet Take 1 tablet (750 mg total) by mouth daily for 5 days. 08/18/17 08/23/17  Tegeler, Canary Brimhristopher J, MD  loratadine (CLARITIN) 10 MG tablet Take 10 mg by mouth daily.    [provider]  losartan (COZAAR) 100 MG tablet Take 100 mg by mouth daily.  08/08/17   [provider]  losartan (COZAAR) 50 MG tablet Take 50 mg by mouth 2 (two) times daily.    [provider]  meloxicam (MOBIC) 7.5 MG tablet  05/14/17   [provider]  metoprolol (LOPRESSOR) 50 MG tablet Take 50 mg by mouth 2 (two) times daily.     [provider]  omeprazole (PRILOSEC) 40 MG capsule Take 40 mg by mouth daily.    [provider]  ondansetron (ZOFRAN) 4 MG tablet Take 1 tablet (4 mg total) by mouth daily as  needed. Patient not taking: Reported on 08/21/2017 04/11/17   Myrna BlazerSchaevitz, David Matthew, MD  oxyCODONE (OXY IR/ROXICODONE) 5 MG immediate release tablet Take 1-2 tablets (5-10 mg total) by mouth every 4 (four) hours as needed for severe pain. Patient not taking: Reported on 08/18/2017 03/11/17   Lyndle HerrlichBowers, James R, MD  promethazine (PHENERGAN) 25 MG tablet Take 25 mg by mouth every 6 (six) hours as needed for nausea or vomiting.  08/08/17   [provider]  propranolol ER (INDERAL LA) 60 MG 24 hr capsule Take 60 mg by mouth daily.  08/08/17   [provider]  tiZANidine (ZANAFLEX) 4 MG tablet Take 4 mg by mouth daily.  08/08/17   [provider]  topiramate (TOPAMAX) 25 MG tablet 25mg  bid for 7 days and then 50mg  bid Patient not taking: Reported on 08/18/2017  01/07/17   Marvel PlanXu, Jindong, MD  triamcinolone (KENALOG) 0.147 MG/GM topical spray  07/17/17   [provider]  zolpidem (AMBIEN) 10 MG tablet Take 10 mg by mouth at bedtime.  08/14/17   [provider]    Allergies Aspirin; Buprenorphine hcl; Codeine; Compazine; Ioxaglate; Ivp dye [iodinated diagnostic agents]; Metrizamide; Morphine and related; Naproxen; Norco [hydrocodone-acetaminophen]; Penicillin g; Penicillins; Prochlorperazine maleate; Reglan [metoclopramide]; Sulfur; Tegretol [carbamazepine]; Toradol [ketorolac tromethamine]; and Tramadol  Family History  Problem Relation Age of Onset  . Heart failure Mother   . Diabetes Father   . Cancer Sister     Social History Social History   Tobacco Use  . Smoking status: Current Every Day Smoker    Packs/day: 0.25    Years: 1.50    Pack years: 0.37    Types: Cigarettes  . Smokeless tobacco: Never Used  Substance Use Topics  . Alcohol use: No    Comment: no alcohol since 2002  . Drug use: No    Comment: electric cig    Review of Systems Constitutional: No fever/chills Eyes: No visual changes. ENT: No sore throat. Cardiovascular: Denies chest  pain. Respiratory: Denies shortness of breath. Gastrointestinal: Positive for abdominal pain. Genitourinary: Negative for dysuria. Musculoskeletal: Negative for back pain. Skin: Negative for rash. Neurological: Positive for headache.  ____________________________________________   PHYSICAL EXAM:  VITAL SIGNS: ED Triage Vitals  Enc Vitals Group     BP 08/23/17 2027 137/81     Pulse Rate 08/23/17 2027 92     Resp 08/23/17 2027 17     Temp 08/23/17 2027 98.1 F (36.7 C)     Temp Source 08/23/17 2027 Oral     SpO2 08/23/17 2027 97 %     Weight 08/23/17 2027 139 lb (63 kg)     Height 08/23/17 2027 5\' 2"  (1.575 m)     Head Circumference --      Peak Flow --      Pain Score 08/23/17 2026 10    Constitutional: Alert and oriented.  Eyes: Conjunctivae are normal.  ENT   Head: Normocephalic and atraumatic.   Nose: No congestion/rhinnorhea.   Mouth/Throat: Mucous membranes are moist.   Neck: No stridor. Hematological/Lymphatic/Immunilogical: No cervical lymphadenopathy. Cardiovascular: Normal rate, regular rhythm.  No murmurs, rubs, or gallops.  Respiratory: Normal respiratory effort without tachypnea nor retractions. Breath sounds are clear and equal bilaterally. No wheezes/rales/rhonchi. Gastrointestinal: Soft and minimally tender to palpation in lower and right lower quadrant. No rebound. No guarding.  Genitourinary: Deferred Musculoskeletal: Normal range of motion in all extremities. No lower extremity edema. Neurologic:  Normal speech and language. No gross focal neurologic deficits are appreciated.  Skin:  Skin is warm, dry and intact. No rash noted. Psychiatric: Mood and affect are normal. Speech and behavior are normal. Patient exhibits appropriate insight and judgment.  ____________________________________________    LABS (pertinent positives/negatives)  upreg negative Lipase 29 CMP wnl except glu 158 CBC wnl UA large leukocytes, too numerous to count  WBC  ____________________________________________   EKG  None  ____________________________________________    RADIOLOGY  None   ____________________________________________   PROCEDURES  Procedures  ____________________________________________   INITIAL IMPRESSION / ASSESSMENT AND PLAN / ED COURSE  Pertinent labs & imaging results that were available during my care of the patient were reviewed by me and considered in my medical decision making (see chart for details).  Patient presented to the emergency department today because of concerns for headache and continued abdominal pain.  Urine  does look like she is still likely has infection however no leukocytosis.  I did discuss with patient repeat CT scan and although I am hesitant to do this given negative CT scan performed 2 days ago, no fever or leukocytosis.  In terms of her migraine she states she does get migraines frequently.  Unfortunately she does have many allergies.  She is requesting narcotics for headaches but given concern for rebound headaches I do not think that this would be in her best interest.  Will try IV fluids, magnesium. Will give dose of IV abx given UTI. Patient states she has prescription from recent hospitalization. Discussed plan with patient.     ____________________________________________   FINAL CLINICAL IMPRESSION(S) / ED DIAGNOSES  Final diagnoses:  Lower urinary tract infectious disease  Bad headache  Abdominal pain, unspecified abdominal location     Note: This dictation was prepared with Dragon dictation. Any transcriptional errors that result from this process are unintentional     Phineas Semen, MD 08/23/17 2237

## 2017-08-23 NOTE — Discharge Instructions (Signed)
Continue taking all of your antibiotics as prescribed and follow up with your PMD this coming Monday for a recheck.  Return to the ED for any concerns.

## 2017-08-23 NOTE — ED Notes (Signed)
Waiting for rocephin to finish to hang magnesium. Pt will be discharged after magnesium infusion is complete.

## 2017-08-23 NOTE — ED Notes (Signed)
Pt states several days of back pain, generalized abd pain, and burning with urination. Pt states she was diagnosed with pyelonephritis. Pt states she has had a headache, generalized since yesterday. Pt states the only thing that helps her headache is dilaudid. Pt appears in no acute distress. Call bell at left side.

## 2017-08-23 NOTE — ED Notes (Signed)
Pt refuses magnesium. Pt states "if he's not giving me anything stronger, I want to leave. I can go home and hurt." pt encouraged to stay and receive antibiotics and fluids.
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
## 2017-09-17 ENCOUNTER — Emergency Department (HOSPITAL_COMMUNITY)
Admission: EM | Admit: 2017-09-17 | Discharge: 2017-09-17 | Discharge status: Against Medical Advice | Payer: No Typology Code available for payment source

## 2017-10-13 ENCOUNTER — Emergency Department (HOSPITAL_COMMUNITY): Payer: No Typology Code available for payment source

## 2017-10-13 ENCOUNTER — Encounter (HOSPITAL_COMMUNITY): Payer: Self-pay | Admitting: Emergency Medicine

## 2017-10-13 ENCOUNTER — Other Ambulatory Visit: Payer: Self-pay

## 2017-10-13 ENCOUNTER — Ambulatory Visit (INDEPENDENT_AMBULATORY_CARE_PROVIDER_SITE_OTHER): Payer: 59 | Admitting: Vascular Surgery

## 2017-10-13 ENCOUNTER — Emergency Department (HOSPITAL_COMMUNITY)
Admission: EM | Admit: 2017-10-13 | Discharge: 2017-10-13 | Disposition: A | Discharge status: Home / Self Care | Payer: No Typology Code available for payment source | Source: Home / Self Care

## 2017-10-13 DIAGNOSIS — I10 Essential (primary) hypertension: Secondary | ICD-10-CM | POA: Diagnosis not present

## 2017-10-13 DIAGNOSIS — E119 Type 2 diabetes mellitus without complications: Secondary | ICD-10-CM | POA: Diagnosis not present

## 2017-10-13 DIAGNOSIS — Z79899 Other long term (current) drug therapy: Secondary | ICD-10-CM | POA: Diagnosis not present

## 2017-10-13 DIAGNOSIS — Z5321 Procedure and treatment not carried out due to patient leaving prior to being seen by health care provider: Secondary | ICD-10-CM

## 2017-10-13 DIAGNOSIS — R1084 Generalized abdominal pain: Secondary | ICD-10-CM | POA: Diagnosis not present

## 2017-10-13 DIAGNOSIS — Z7984 Long term (current) use of oral hypoglycemic drugs: Secondary | ICD-10-CM | POA: Diagnosis not present

## 2017-10-13 DIAGNOSIS — R51 Headache: Secondary | ICD-10-CM | POA: Insufficient documentation

## 2017-10-13 DIAGNOSIS — G43909 Migraine, unspecified, not intractable, without status migrainosus: Secondary | ICD-10-CM | POA: Diagnosis not present

## 2017-10-13 DIAGNOSIS — F17210 Nicotine dependence, cigarettes, uncomplicated: Secondary | ICD-10-CM | POA: Diagnosis not present

## 2017-10-13 MED ORDER — ONDANSETRON 4 MG PO TBDP
4.0000 mg | ORAL_TABLET | Freq: Once | ORAL | Status: AC
  Administered 2017-10-13: 4 mg via ORAL
  Filled 2017-10-13: qty 1

## 2017-10-13 NOTE — ED Triage Notes (Signed)
Pt to ED with c/o migraine headache x's 5 days.  St's has taken OTC meds without relief.  Pt also st's she has nausea, vomiting and diarrhea.

## 2017-10-13 NOTE — ED Triage Notes (Signed)
Labels given to registration and pt left

## 2017-10-14 ENCOUNTER — Emergency Department (HOSPITAL_COMMUNITY)
Admission: EM | Admit: 2017-10-14 | Discharge: 2017-10-14 | Disposition: A | Discharge status: Home / Self Care | Payer: No Typology Code available for payment source | Source: Home / Self Care

## 2017-10-14 ENCOUNTER — Emergency Department
Admission: EM | Admit: 2017-10-14 | Discharge: 2017-10-14 | Disposition: A | Discharge status: Home / Self Care | Payer: No Typology Code available for payment source | Attending: Emergency Medicine | Admitting: Emergency Medicine

## 2017-10-14 ENCOUNTER — Encounter: Payer: Self-pay | Admitting: Emergency Medicine

## 2017-10-14 DIAGNOSIS — I10 Essential (primary) hypertension: Secondary | ICD-10-CM | POA: Insufficient documentation

## 2017-10-14 DIAGNOSIS — G43909 Migraine, unspecified, not intractable, without status migrainosus: Secondary | ICD-10-CM | POA: Insufficient documentation

## 2017-10-14 DIAGNOSIS — Z79899 Other long term (current) drug therapy: Secondary | ICD-10-CM | POA: Insufficient documentation

## 2017-10-14 DIAGNOSIS — R1084 Generalized abdominal pain: Secondary | ICD-10-CM | POA: Insufficient documentation

## 2017-10-14 DIAGNOSIS — F17210 Nicotine dependence, cigarettes, uncomplicated: Secondary | ICD-10-CM | POA: Insufficient documentation

## 2017-10-14 DIAGNOSIS — Z7984 Long term (current) use of oral hypoglycemic drugs: Secondary | ICD-10-CM | POA: Insufficient documentation

## 2017-10-14 DIAGNOSIS — E119 Type 2 diabetes mellitus without complications: Secondary | ICD-10-CM | POA: Insufficient documentation

## 2017-10-14 LAB — COMPREHENSIVE METABOLIC PANEL
ALK PHOS: 118 U/L (ref 38–126)
ALT: 20 U/L (ref 14–54)
AST: 21 U/L (ref 15–41)
Albumin: 4 g/dL (ref 3.5–5.0)
Anion gap: 7 (ref 5–15)
BILIRUBIN TOTAL: 0.5 mg/dL (ref 0.3–1.2)
BUN: 10 mg/dL (ref 6–20)
CHLORIDE: 105 mmol/L (ref 101–111)
CO2: 30 mmol/L (ref 22–32)
Calcium: 9.5 mg/dL (ref 8.9–10.3)
Creatinine, Ser: 0.72 mg/dL (ref 0.44–1.00)
Glucose, Bld: 140 mg/dL — ABNORMAL HIGH (ref 65–99)
Potassium: 3.8 mmol/L (ref 3.5–5.1)
Sodium: 142 mmol/L (ref 135–145)
TOTAL PROTEIN: 7.4 g/dL (ref 6.5–8.1)

## 2017-10-14 LAB — CBC WITH DIFFERENTIAL/PLATELET
BASOS ABS: 0 10*3/uL (ref 0–0.1)
Basophils Relative: 1 %
Eosinophils Absolute: 0.1 10*3/uL (ref 0–0.7)
Eosinophils Relative: 1 %
HEMATOCRIT: 45 % (ref 35.0–47.0)
Hemoglobin: 15 g/dL (ref 12.0–16.0)
LYMPHS ABS: 3 10*3/uL (ref 1.0–3.6)
LYMPHS PCT: 36 %
MCH: 31.1 pg (ref 26.0–34.0)
MCHC: 33.4 g/dL (ref 32.0–36.0)
MCV: 93.1 fL (ref 80.0–100.0)
Monocytes Absolute: 0.8 10*3/uL (ref 0.2–0.9)
Monocytes Relative: 10 %
NEUTROS ABS: 4.3 10*3/uL (ref 1.4–6.5)
Neutrophils Relative %: 52 %
Platelets: 166 10*3/uL (ref 150–440)
RBC: 4.83 MIL/uL (ref 3.80–5.20)
RDW: 14.8 % — ABNORMAL HIGH (ref 11.5–14.5)
WBC: 8.2 10*3/uL (ref 3.6–11.0)

## 2017-10-14 LAB — LIPASE, BLOOD: LIPASE: 28 U/L (ref 11–51)

## 2017-10-14 MED ORDER — HALOPERIDOL LACTATE 5 MG/ML IJ SOLN
5.0000 mg | Freq: Once | INTRAMUSCULAR | Status: AC
  Administered 2017-10-14: 5 mg via INTRAVENOUS
  Filled 2017-10-14: qty 1

## 2017-10-14 MED ORDER — DIPHENHYDRAMINE HCL 50 MG/ML IJ SOLN
25.0000 mg | INTRAMUSCULAR | Status: AC
  Administered 2017-10-14: 25 mg via INTRAVENOUS
  Filled 2017-10-14: qty 1

## 2017-10-14 MED ORDER — SODIUM CHLORIDE 0.9 % IV BOLUS (SEPSIS)
500.0000 mL | INTRAVENOUS | Status: AC
  Administered 2017-10-14: 500 mL via INTRAVENOUS

## 2017-10-14 MED ORDER — MAGNESIUM SULFATE 2 GM/50ML IV SOLN
2.0000 g | Freq: Once | INTRAVENOUS | Status: AC
  Administered 2017-10-14: 2 g via INTRAVENOUS
  Filled 2017-10-14: qty 50

## 2017-10-14 NOTE — ED Notes (Signed)
Upon reassesment, pt up to toilet, did not call out for any help.  Pt has unhooked herself from monitor and pulled out PIV; pt received approximately half of magnesium dose.  Pt states she does not need to be on monitor any longer.

## 2017-10-14 NOTE — ED Notes (Signed)
pt ambulatory without difficulty or distress noted; pt noted going outside to smoke

## 2017-10-14 NOTE — ED Provider Notes (Signed)
[facility] Emergency Department Provider Note  ____________________________________________   First MD Initiated Contact with Patient 10/14/17 [phone]     (approximate)  I have reviewed the triage vital signs and the nursing notes.   HISTORY  Chief Complaint Migraine    HPI Stephanie [name] is a 52 y.o. female with medical history as listed below and 7 emergency department visits within the [facility] system in the last 6 months.  She presents for evaluation of gradually worsening headache/migraine for several days.  Loud noises and bright lights make the pain worse.  She has had some nausea and 2 episodes of vomiting today.  She states that she is also been having gradually worsening generalized abdominal pain, not localized to any particular area but "it just hurts all over".  She denies fever/chills, recent respiratory symptoms, chest pain, difficulty breathing, cough, dysuria.  She has had all of these symptoms in the past and states that her headache feels similar to her usual migraine and that she has had multiple episodes of abdominal pain in the past including prior SBO.  She is also reporting that her toes are very cold and that has been the case for at least 2 months and she has an appointment with vascular surgery and 4 days for further evaluation.  She has no pain in her foot or up in her legs.  It was notable that while she was waiting for several hours in the waiting room outside multiple times to smoke and back in without any difficulty or signs of distress, but she reports that her pain is severe and she was not able to walk on her own to the exam room back.   Past Medical History:  Diagnosis Date  . Diabetes mellitus without complication (HCC)   . Diverticulitis   . Headache   . Hepatitis C   . History of kidney stones   . Hypertension   . IBS (irritable bowel syndrome)   . Kidney stones   . Sciatica   . Sleep apnea    on cpap  . Stroke [facility](HCC)       Patient Active Problem List   Diagnosis Date Noted  . UTI (urinary tract infection) 08/21/2017  . Nausea & vomiting 08/21/2017  . Nicotine dependence 08/21/2017  . Headache 01/07/2017  . Small bowel obstruction (HCC) 09/02/2016  . Pain and swelling of left upper extremity 07/22/2016  . Superficial venous thrombosis of arm, left 07/22/2016  . ICH (intracerebral hemorrhage) (HCC)   . Essential hypertension, malignant 04/19/2016  . Cytotoxic brain edema (HCC) 04/19/2016  . IVH (intraventricular hemorrhage) (HCC) 04/18/2016  . Hepatitis C     Past Surgical History:  Procedure Laterality Date  . ABDOMINAL HYSTERECTOMY    . carpel tunnel syndrome  2017  . HUMERUS IM NAIL Right 03/11/2017   Procedure: INTRAMEDULLARY (IM) NAIL HUMERAL;  Surgeon: Lyndle HerrlichBowers, James R, MD;  Location: [facility] ORS;  Service: Orthopedics;  Laterality: Right;  . KIDNEY STONE SURGERY Right 02/12/12  . KIDNEY STONE SURGERY Left 07/15/2012  . LIGATION OF ARTERIOVENOUS  FISTULA Left 06/21/2016   Procedure: LIGATION OF ARTERIOVENOUS  FISTULA ( LIGATION BASILIC VEIN );  Surgeon: Renford DillsGregory G Schnier, MD;  Location: [facility] ORS;  Service: Vascular;  Laterality: Left;  . LITHOTRIPSY    . OOPHORECTOMY    . SINUS EXPLORATION    . TONSILLECTOMY      Prior to Admission medications   Medication Sig Start Date End Date Taking? Authorizing Provider  acetaminophen (TYLENOL) 500 MG tablet Take 1,000 mg by mouth 2 (two) times daily.    [provider]  alprazolam Prudy Feeler) 2 MG tablet Take 1 mg by mouth 2 (two) times daily.     [provider]  amLODipine (NORVASC) 10 MG tablet Take 10 mg by mouth daily.    [provider]  atorvastatin (LIPITOR) 40 MG tablet Take 40 mg by mouth every evening.    [provider]  baclofen (LIORESAL) 10 MG tablet Take 10 mg by mouth 2 (two) times daily as needed for muscle spasms.  06/18/17   [provider]  butalbital-acetaminophen-caffeine (FIORICET, ESGIC)  [phone] MG tablet  05/14/17   [provider]  calcipotriene (DOVONOX) 0.005 % cream  07/17/17   [provider]  carisoprodol (SOMA) 350 MG tablet Take 1 tablet (350 mg total) by mouth 3 (three) times daily as needed. Patient not taking: Reported on 08/18/2017 04/11/17   Myrna Blazer, MD  Cholecalciferol (VITAMIN D) 2000 units CAPS Take 2,000 Units by mouth daily.    [provider]  clopidogrel (PLAVIX) 75 MG tablet Take 1 tablet (75 mg total) by mouth daily. 06/27/16   Marvel Plan, MD  cyclobenzaprine (FLEXERIL) 10 MG tablet Take 10 mg by mouth 3 (three) times daily as needed for muscle spasms.    [provider]  diphenoxylate-atropine (LOMOTIL) 2.5-0.025 MG tablet Take 1 tablet by mouth 4 (four) times daily as needed for diarrhea or loose stools.    [provider]  DULoxetine (CYMBALTA) 60 MG capsule Take 60 mg by mouth daily.    [provider]  glipiZIDE (GLUCOTROL) 5 MG tablet Take 1 tablet (5 mg total) by mouth 2 (two) times daily before a meal. 04/30/16   Lora Paula, MD  hydrochlorothiazide (HYDRODIURIL) 25 MG tablet Take 25 mg by mouth daily.  07/01/17   [provider]  hyoscyamine (LEVSIN, ANASPAZ) 0.125 MG tablet Take 0.125 mg by mouth every 6 (six) hours as needed.  08/08/17   [provider]  INVOKANA 300 MG TABS tablet Take 300 mg by mouth daily before breakfast.     [provider]  levETIRAcetam (KEPPRA) 250 MG tablet  07/18/17   [provider]  levETIRAcetam (KEPPRA) 500 MG tablet Take 1 tablet (500 mg total) by mouth 2 (two) times daily. 04/30/16   Lora Paula, MD  loratadine (CLARITIN) 10 MG tablet Take 10 mg by mouth daily.    [provider]  losartan (COZAAR) 100 MG tablet Take 100 mg by mouth daily.  08/08/17   [provider]  losartan (COZAAR) 50 MG tablet Take 50 mg by mouth 2 (two) times daily.    [provider]  meloxicam (MOBIC)  7.5 MG tablet  05/14/17   [provider]  metoprolol (LOPRESSOR) 50 MG tablet Take 50 mg by mouth 2 (two) times daily.     [provider]  omeprazole (PRILOSEC) 40 MG capsule Take 40 mg by mouth daily.    [provider]  ondansetron (ZOFRAN) 4 MG tablet Take 1 tablet (4 mg total) by mouth daily as needed. Patient not taking: Reported on 08/21/2017 04/11/17   Myrna Blazer, MD  oxyCODONE (OXY IR/ROXICODONE) 5 MG immediate release tablet Take 1-2 tablets (5-10 mg total) by mouth every 4 (four) hours as needed for severe pain. Patient not taking: Reported on 08/18/2017 03/11/17   Lyndle Herrlich, MD  promethazine (PHENERGAN) 25 MG tablet Take 25 mg  by mouth every 6 (six) hours as needed for nausea or vomiting.  08/08/17   [provider]  propranolol ER (INDERAL LA) 60 MG 24 hr capsule Take 60 mg by mouth daily.  08/08/17   [provider]  tiZANidine (ZANAFLEX) 4 MG tablet Take 4 mg by mouth daily.  08/08/17   [provider]  topiramate (TOPAMAX) 25 MG tablet 25mg  bid for 7 days and then 50mg  bid Patient not taking: Reported on 08/18/2017 01/07/17   Marvel Plan, MD  triamcinolone (KENALOG) 0.147 MG/GM topical spray  07/17/17   [provider]  zolpidem (AMBIEN) 10 MG tablet Take 10 mg by mouth at bedtime.  08/14/17   [provider]    Allergies Aspirin; Buprenorphine hcl; Codeine; Compazine; Ioxaglate; Ivp dye [iodinated diagnostic agents]; Metrizamide; Morphine and related; Naproxen; Norco [hydrocodone-acetaminophen]; Penicillin g; Penicillins; Prochlorperazine maleate; Reglan [metoclopramide]; Sulfur; Tegretol [carbamazepine]; Toradol [ketorolac tromethamine]; and Tramadol  Family History  Problem Relation Age of Onset  . Heart failure Mother   . Diabetes Father   . Cancer Sister     Social History Social History   Tobacco Use  . Smoking status: Current Every Day Smoker    Packs/day: 0.25    Years: 1.50     Pack years: 0.37    Types: Cigarettes  . Smokeless tobacco: Never Used  Substance Use Topics  . Alcohol use: No    Comment: no alcohol since 2002  . Drug use: No    Comment: electric cig    Review of Systems Constitutional: No fever/chills Eyes: No visual changes but reports photophobia ENT: No sore throat. Cardiovascular: Denies chest pain. Respiratory: Denies shortness of breath. Gastrointestinal: Generalized abdominal pain gradually worsening for several days.  Nausea with 2 episodes of vomiting earlier today, she believes to be associated with the headache Genitourinary: Negative for dysuria. Musculoskeletal: Negative for neck pain.  Negative for back pain. Integumentary: Negative for rash. Neurological: Frontal headache   ____________________________________________   PHYSICAL EXAM:  VITAL SIGNS: ED Triage Vitals  Enc Vitals Group     BP 10/14/17 0113 (!) 154/93     Pulse Rate 10/14/17 0113 86     Resp 10/14/17 0113 20     Temp 10/14/17 0113 97.6 F (36.4 C)     Temp Source 10/14/17 0113 Oral     SpO2 10/14/17 0113 98 %     Weight 10/14/17 0124 81.6 kg (180 lb)     Height 10/14/17 0124 1.575 m (5\' 2" )     Head Circumference --      Peak Flow --      Pain Score 10/14/17 0124 8     Pain Loc --      Pain Edu? --      Excl. in GC? --     Constitutional: Alert and oriented.  Appears uncomfortable and to have a number of chronic medical conditions but is in no acute distress Eyes: Conjunctivae are normal. PERRL. EOMI. Head: Atraumatic. Nose: No congestion/rhinnorhea. Mouth/Throat: Mucous membranes are moist. Neck: No stridor.  No meningeal signs.   Cardiovascular: Normal rate, regular rhythm. Good peripheral circulation. Grossly normal heart sounds. Respiratory: Normal respiratory effort.  No retractions. Lungs CTAB. Gastrointestinal: Soft with diffuse abdominal pain throughout the abdomen but no focal tenderness.  No distention. Musculoskeletal: No lower  extremity tenderness nor edema. No gross deformities of extremities. Neurologic:  Normal speech and language. No gross focal neurologic deficits are appreciated.  Skin:  Skin is warm, dry and  intact. No rash noted. Psychiatric: Mood and affect are normal. Speech and behavior are normal.  ____________________________________________   LABS (all labs ordered are listed, but only abnormal results are displayed)  Labs Reviewed  CBC WITH DIFFERENTIAL/PLATELET - Abnormal; Notable for the following components:      Result Value   RDW 14.8 (*)    All other components within normal limits  COMPREHENSIVE METABOLIC PANEL - Abnormal; Notable for the following components:   Glucose, Bld 140 (*)    All other components within normal limits  LIPASE, BLOOD   ____________________________________________  EKG  None - EKG not ordered by ED physician ____________________________________________  RADIOLOGY   ED MD interpretation: No acute abnormalities identified on CT scan of the head.  Official radiology report(s): Ct Head Wo Contrast  Result Date: 10/14/2017 CLINICAL DATA:  Severe headache EXAM: CT HEAD WITHOUT CONTRAST TECHNIQUE: Contiguous axial images were obtained from the base of the skull through the vertex without intravenous contrast. COMPARISON:  08/18/2017, 08/16/2017, 05/11/2017 MRI brain 04/24/2016 FINDINGS: Brain: No acute territorial infarction, hemorrhage or intracranial mass is visualized. Similar appearance of left parietooccipital encephalomalacia. Stable ventricle size. Vascular: No hyperdense vessels. Scattered calcifications at the carotid siphons. Skull: Normal. Negative for fracture or focal lesion. Sinuses/Orbits: Mucosal thickening in the sphenoid and ethmoid sinuses. No acute orbital abnormality. Other: None IMPRESSION: No CT evidence for acute intracranial abnormality. 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 Electronically Signed   By: Jasmine Pang M.D.   On: 10/14/2017 00:11      ____________________________________________   PROCEDURES  Critical Care performed: No   Procedure(s) performed:   Procedures   ____________________________________________   INITIAL IMPRESSION / ASSESSMENT AND PLAN / ED COURSE  As part of my medical decision making, I reviewed the following data within the electronic MEDICAL RECORD NUMBER Nursing notes reviewed and incorporated, Labs reviewed , Old chart reviewed and Notes from prior ED visits, [place] Controlled Substance Database    Differential diagnosis includes, but is not limited to, intracranial hemorrhage, meningitis/encephalitis, previous head trauma, cavernous venous thrombosis, tension headache, temporal arteritis, migraine or migraine equivalent, idiopathic intracranial hypertension, and non-specific headache.  She has a history of migraines and all of her symptoms are consistent with migraine.  She has no tenderness palpation of her temples to suggest temporal arteritis.  She has numerous medication allergies listed in the system and to me that she does have many allergies.  Reviewing the list it seems like many of them are adverse reactions.  She did not specifically asked me for narcotics although she has ask previous providers based on a review of medical records.  Given her history of migraines and her current presentation, I want to try to do my best to improve the migraine.  I think this would be best accomplished through the use of Haldol and Benadryl for refractory and persistent migraine over the last few days.  There is a risk that she will have a reaction but she reportedly has taken Phenergan without issues in the past and it is similarly related to Compazine and metoclopramide, so I am hopeful that by taking Benadryl with the Haldol it will help.  I have also ordered magnesium 2 g IV which apparently has worked for migraines in the past.  Blood work has been ordered and at this point I do not feel that she would  benefit from imaging of her abdomen although that may need reassessment after her migraine has been addressed.  Clinical Course as of Oct 14 744  Tue Oct 14, 2017  0643 Checked on patient, she is sleeping comfortably.  [CF]  [phone] I reviewed the patient's prescription history over the last 24 months in the multi-state controlled substances database(s) that includes [place], [place], [place], [place], [place], [place], [place], [place], [place], [place], [place], [place], [place], [place], and [place].  Results were notable for 83 prescriptions for controlled substances over 2 years from 16 different providers.  The majority of these prescriptions seem to be alprazolam, Ambien, and Lyrica.  However, she has also had 24 prescriptions for either tramadol, Tylenol 3, hydrocodone, or oxycodone over the same time period from a variety of providers including her orthopedic surgeon.  [CF]  [id number] Transferring ED care to Dr. Mayford Knife to reassess and likely discharge if symptoms have improved after migraine treatment.  [CF]    Clinical Course User Index [CF] Loleta Rose, MD    ____________________________________________  FINAL CLINICAL IMPRESSION(S) / ED DIAGNOSES  Final diagnoses:  Migraine without status migrainosus, not intractable, unspecified migraine type  Generalized abdominal pain     MEDICATIONS GIVEN DURING THIS VISIT:  Medications  magnesium sulfate IVPB 2 g 50 mL (not administered)  haloperidol lactate (HALDOL) injection 5 mg (5 mg Intravenous Given 10/14/17 0609)  diphenhydrAMINE (BENADRYL) injection 25 mg (25 mg Intravenous Given 10/14/17 0607)  sodium chloride 0.9 % bolus 500 mL (500 mLs Intravenous New Bag/Given 10/14/17 0615)     ED Discharge Orders    None       Note:  This document was prepared using Dragon voice recognition software and may include unintentional dictation errors.    Loleta Rose, MD 10/14/17 [phone]

## 2017-10-14 NOTE — ED Notes (Signed)
While starting iv and administering meds pt was dosing off to sleep and though process seemed to be slightly delayed. While explaining what medications were being given pt states " the only thing that's going to fix my headache is dilaudid or morphine, and that will make it go away for about 3 months".

## 2017-10-14 NOTE — ED Notes (Signed)
Pt ambulatory to STAT desk to inquire over wait; pt updated on wait time

## 2017-10-14 NOTE — ED Notes (Signed)
Pt ambulatory to STAT desk without difficulty or distress noted to inquire over wait time, stating that she has been here over 3 hours waiting; pt updated on wait time

## 2017-10-14 NOTE — ED Notes (Signed)
Pt requesting iv to be taken out so her husband can come pick her up

## 2017-10-14 NOTE — Discharge Instructions (Signed)
You have been seen in the Emergency Department (ED) for a migraine and generalized abdominal pain.  Fortunately your workup was reassuring.     Please use Tylenol or Motrin as needed for symptoms, but only as written on the box, and take any regular medications that have been prescribed for you. As we have discussed, please follow up with your doctor as soon as possible regarding today?s ED visit and your headache symptoms.    Call your doctor or return to the Emergency Department (ED) if you have a worsening headache, sudden and severe headache, confusion, slurred speech, facial droop, weakness or numbness in any arm or leg, extreme fatigue, or other symptoms that concern you.

## 2017-10-14 NOTE — ED Triage Notes (Signed)
Pt presents to ED with worsening migraine headache since last Monday. Pt reports sensitivity to lights and sounds. Pt states she also feels like her toes are "frozen because they are so cold" and she "wishes they would warm up". Pt has an appt with a vascular MD this Friday regarding her cold toes.

## 2017-10-15 ENCOUNTER — Encounter (INDEPENDENT_AMBULATORY_CARE_PROVIDER_SITE_OTHER): Payer: Self-pay | Admitting: Vascular Surgery

## 2017-10-21 ENCOUNTER — Emergency Department: Payer: No Typology Code available for payment source

## 2017-10-21 ENCOUNTER — Encounter: Payer: Self-pay | Admitting: Emergency Medicine

## 2017-10-21 ENCOUNTER — Emergency Department
Admission: EM | Admit: 2017-10-21 | Discharge: 2017-10-22 | Discharge status: Against Medical Advice | Payer: No Typology Code available for payment source | Attending: Emergency Medicine | Admitting: Emergency Medicine

## 2017-10-21 ENCOUNTER — Other Ambulatory Visit: Payer: Self-pay

## 2017-10-21 DIAGNOSIS — Z7902 Long term (current) use of antithrombotics/antiplatelets: Secondary | ICD-10-CM | POA: Diagnosis not present

## 2017-10-21 DIAGNOSIS — Z8673 Personal history of transient ischemic attack (TIA), and cerebral infarction without residual deficits: Secondary | ICD-10-CM | POA: Insufficient documentation

## 2017-10-21 DIAGNOSIS — F17210 Nicotine dependence, cigarettes, uncomplicated: Secondary | ICD-10-CM | POA: Insufficient documentation

## 2017-10-21 DIAGNOSIS — R51 Headache: Secondary | ICD-10-CM | POA: Diagnosis not present

## 2017-10-21 DIAGNOSIS — Z7984 Long term (current) use of oral hypoglycemic drugs: Secondary | ICD-10-CM | POA: Diagnosis not present

## 2017-10-21 DIAGNOSIS — Z79899 Other long term (current) drug therapy: Secondary | ICD-10-CM | POA: Diagnosis not present

## 2017-10-21 DIAGNOSIS — I10 Essential (primary) hypertension: Secondary | ICD-10-CM | POA: Diagnosis not present

## 2017-10-21 DIAGNOSIS — E119 Type 2 diabetes mellitus without complications: Secondary | ICD-10-CM | POA: Diagnosis not present

## 2017-10-21 DIAGNOSIS — R519 Headache, unspecified: Secondary | ICD-10-CM

## 2017-10-21 DIAGNOSIS — R1084 Generalized abdominal pain: Secondary | ICD-10-CM | POA: Insufficient documentation

## 2017-10-21 DIAGNOSIS — R109 Unspecified abdominal pain: Secondary | ICD-10-CM

## 2017-10-21 LAB — BASIC METABOLIC PANEL
ANION GAP: 11 (ref 5–15)
BUN: 15 mg/dL (ref 6–20)
CHLORIDE: 104 mmol/L (ref 101–111)
CO2: 25 mmol/L (ref 22–32)
Calcium: 9.3 mg/dL (ref 8.9–10.3)
Creatinine, Ser: 0.78 mg/dL (ref 0.44–1.00)
GFR calc Af Amer: >60 mL/min (ref >60)
GLUCOSE: 172 mg/dL — AB (ref 65–99)
POTASSIUM: 3.5 mmol/L (ref 3.5–5.1)
SODIUM: 140 mmol/L (ref 135–145)

## 2017-10-21 LAB — CBC
HEMATOCRIT: 45.4 % (ref 35.0–47.0)
HEMOGLOBIN: 15.4 g/dL (ref 12.0–16.0)
MCH: 31.6 pg (ref 26.0–34.0)
MCHC: 34 g/dL (ref 32.0–36.0)
MCV: 93.1 fL (ref 80.0–100.0)
Platelets: 179 10*3/uL (ref 150–440)
RBC: 4.88 MIL/uL (ref 3.80–5.20)
RDW: 14.6 % — ABNORMAL HIGH (ref 11.5–14.5)
WBC: 7.7 10*3/uL (ref 3.6–11.0)

## 2017-10-21 LAB — URINALYSIS, COMPLETE (UACMP) WITH MICROSCOPIC
Bacteria, UA: NONE SEEN
Bilirubin Urine: NEGATIVE
Ketones, ur: NEGATIVE mg/dL
Nitrite: NEGATIVE
Protein, ur: NEGATIVE mg/dL
SPECIFIC GRAVITY, URINE: 1.009 (ref 1.005–1.030)
pH: 6 (ref 5.0–8.0)

## 2017-10-21 MED ORDER — DIPHENHYDRAMINE HCL 50 MG/ML IJ SOLN
25.0000 mg | Freq: Once | INTRAMUSCULAR | Status: AC
  Administered 2017-10-21: 25 mg via INTRAVENOUS
  Filled 2017-10-21: qty 1

## 2017-10-21 MED ORDER — HYDROMORPHONE HCL 1 MG/ML IJ SOLN
0.5000 mg | Freq: Once | INTRAMUSCULAR | Status: AC
  Administered 2017-10-21: 0.5 mg via INTRAVENOUS
  Filled 2017-10-21: qty 1

## 2017-10-21 MED ORDER — SODIUM CHLORIDE 0.9 % IV BOLUS (SEPSIS)
1000.0000 mL | Freq: Once | INTRAVENOUS | Status: AC
  Administered 2017-10-21: 1000 mL via INTRAVENOUS

## 2017-10-21 NOTE — ED Provider Notes (Signed)
[facility] Emergency Department Provider Note   ____________________________________________   First MD Initiated Contact with Patient 10/21/17 2237     (approximate)  I have reviewed the triage vital signs and the nursing notes.   HISTORY  Chief Complaint Headache and Flank Pain    HPI Stephanie [name] is a 52 y.o. female Who reports headache coming on gradually lasting for the last 4 days. She also was chasing her dog and fell into the heating vent and hurt her left hand and has abrasions on both sides of her left knee. The knee itself is not swollen or red works well. The headache feels like a her typical migraine. She also has left-sided flank painand a low-grade fever.   Past Medical History:  Diagnosis Date  . Diabetes mellitus without complication (HCC)   . Diverticulitis   . Headache   . Hepatitis C   . History of kidney stones   . Hypertension   . IBS (irritable bowel syndrome)   . Kidney stones   . Sciatica   . Sleep apnea    on cpap  . Stroke [facility])     Patient Active Problem List   Diagnosis Date Noted  . UTI (urinary tract infection) 08/21/2017  . Nausea & vomiting 08/21/2017  . Nicotine dependence 08/21/2017  . Headache 01/07/2017  . Small bowel obstruction (HCC) 09/02/2016  . Pain and swelling of left upper extremity 07/22/2016  . Superficial venous thrombosis of arm, left 07/22/2016  . ICH (intracerebral hemorrhage) (HCC)   . Essential hypertension, malignant 04/19/2016  . Cytotoxic brain edema (HCC) 04/19/2016  . IVH (intraventricular hemorrhage) (HCC) 04/18/2016  . Hepatitis C     Past Surgical History:  Procedure Laterality Date  . ABDOMINAL HYSTERECTOMY    . carpel tunnel syndrome  2017  . HUMERUS IM NAIL Right 03/11/2017   Procedure: INTRAMEDULLARY (IM) NAIL HUMERAL;  Surgeon: Lyndle Herrlich, MD;  Location: [facility] ORS;  Service: Orthopedics;  Laterality: Right;  . KIDNEY STONE SURGERY Right 02/12/12  . KIDNEY  STONE SURGERY Left 07/15/2012  . LIGATION OF ARTERIOVENOUS  FISTULA Left 06/21/2016   Procedure: LIGATION OF ARTERIOVENOUS  FISTULA ( LIGATION BASILIC VEIN );  Surgeon: Renford Dills, MD;  Location: [facility] ORS;  Service: Vascular;  Laterality: Left;  . LITHOTRIPSY    . OOPHORECTOMY    . SINUS EXPLORATION    . TONSILLECTOMY      Prior to Admission medications   Medication Sig Start Date End Date Taking? Authorizing Provider  acetaminophen (TYLENOL) 500 MG tablet Take 1,000 mg by mouth 2 (two) times daily.    [provider]  alprazolam Prudy Feeler) 2 MG tablet Take 1 mg by mouth 2 (two) times daily.     [provider]  amLODipine (NORVASC) 10 MG tablet Take 10 mg by mouth daily.    [provider]  atorvastatin (LIPITOR) 40 MG tablet Take 40 mg by mouth every evening.    [provider]  baclofen (LIORESAL) 10 MG tablet Take 10 mg by mouth 2 (two) times daily as needed for muscle spasms.  06/18/17   [provider]  butalbital-acetaminophen-caffeine (FIORICET, ESGIC) [phone] MG tablet  05/14/17   [provider]  calcipotriene (DOVONOX) 0.005 % cream  07/17/17   [provider]  carisoprodol (SOMA) 350 MG tablet Take 1 tablet (350 mg total) by mouth 3 (three) times daily as needed. Patient not taking: Reported on 08/18/2017 04/11/17   Myrna Blazer,  MD  Cholecalciferol (VITAMIN D) 2000 units CAPS Take 2,000 Units by mouth daily.    [provider]  clopidogrel (PLAVIX) 75 MG tablet Take 1 tablet (75 mg total) by mouth daily. 06/27/16   Marvel Plan, MD  cyclobenzaprine (FLEXERIL) 10 MG tablet Take 10 mg by mouth 3 (three) times daily as needed for muscle spasms.    [provider]  diphenoxylate-atropine (LOMOTIL) 2.5-0.025 MG tablet Take 1 tablet by mouth 4 (four) times daily as needed for diarrhea or loose stools.    [provider]  DULoxetine (CYMBALTA) 60 MG capsule Take 60 mg by mouth daily.     [provider]  glipiZIDE (GLUCOTROL) 5 MG tablet Take 1 tablet (5 mg total) by mouth 2 (two) times daily before a meal. 04/30/16   Lora Paula, MD  hydrochlorothiazide (HYDRODIURIL) 25 MG tablet Take 25 mg by mouth daily.  07/01/17   [provider]  hyoscyamine (LEVSIN, ANASPAZ) 0.125 MG tablet Take 0.125 mg by mouth every 6 (six) hours as needed.  08/08/17   [provider]  INVOKANA 300 MG TABS tablet Take 300 mg by mouth daily before breakfast.     [provider]  levETIRAcetam (KEPPRA) 250 MG tablet  07/18/17   [provider]  levETIRAcetam (KEPPRA) 500 MG tablet Take 1 tablet (500 mg total) by mouth 2 (two) times daily. 04/30/16   Lora Paula, MD  loratadine (CLARITIN) 10 MG tablet Take 10 mg by mouth daily.    [provider]  losartan (COZAAR) 100 MG tablet Take 100 mg by mouth daily.  08/08/17   [provider]  losartan (COZAAR) 50 MG tablet Take 50 mg by mouth 2 (two) times daily.    [provider]  meloxicam (MOBIC) 7.5 MG tablet  05/14/17   [provider]  metoprolol (LOPRESSOR) 50 MG tablet Take 50 mg by mouth 2 (two) times daily.     [provider]  omeprazole (PRILOSEC) 40 MG capsule Take 40 mg by mouth daily.    [provider]  ondansetron (ZOFRAN) 4 MG tablet Take 1 tablet (4 mg total) by mouth daily as needed. Patient not taking: Reported on 08/21/2017 04/11/17   Myrna Blazer, MD  oxyCODONE (OXY IR/ROXICODONE) 5 MG immediate release tablet Take 1-2 tablets (5-10 mg total) by mouth every 4 (four) hours as needed for severe pain. Patient not taking: Reported on 08/18/2017 03/11/17   Lyndle Herrlich, MD  promethazine (PHENERGAN) 25 MG tablet Take 25 mg by mouth every 6 (six) hours as needed for nausea or vomiting.  08/08/17   [provider]  propranolol ER (INDERAL LA) 60 MG 24 hr capsule Take 60 mg by mouth daily.  08/08/17   [provider]    tiZANidine (ZANAFLEX) 4 MG tablet Take 4 mg by mouth daily.  08/08/17   [provider]  topiramate (TOPAMAX) 25 MG tablet 25mg  bid for 7 days and then 50mg  bid Patient not taking: Reported on 08/18/2017 01/07/17   Marvel Plan, MD  triamcinolone (KENALOG) 0.147 MG/GM topical spray  07/17/17   [provider]  zolpidem (AMBIEN) 10 MG tablet Take 10 mg by mouth at bedtime.  08/14/17   [provider]    Allergies Aspirin; Buprenorphine hcl; Codeine; Compazine; Ioxaglate; Ivp dye [iodinated diagnostic agents]; Metrizamide; Naproxen; Norco [hydrocodone-acetaminophen]; Penicillin g; Penicillins; Prochlorperazine maleate; Reglan [metoclopramide]; Sulfur; Tegretol [carbamazepine]; Toradol [ketorolac tromethamine]; and Tramadol  Family History  Problem Relation Age of  Onset  . Heart failure Mother   . Diabetes Father   . Cancer Sister     Social History Social History   Tobacco Use  . Smoking status: Current Every Day Smoker    Packs/day: 0.25    Years: 1.50    Pack years: 0.37    Types: Cigarettes  . Smokeless tobacco: Never Used  Substance Use Topics  . Alcohol use: No    Comment: no alcohol since 2002  . Drug use: No    Comment: electric cig    Review of Systems  Constitutional:fever/chills Eyes: No visual changes. ENT: No sore throat. Cardiovascular: Denies chest pain. Respiratory: Denies shortness of breath. Gastrointestinal: No abdominal pain.  No nausea, no vomiting.  No diarrhea.  No constipation. Genitourinary: Negative for dysuria. Musculoskeletal: Negative for back pain. Skin: Negative for rash. Neurological: Negative for headaches, focal weakness   ____________________________________________   PHYSICAL EXAM:  VITAL SIGNS: ED Triage Vitals  Enc Vitals Group     BP 10/21/17 1935 (!) 175/109     Pulse Rate 10/21/17 1935 (!) 103     Resp 10/21/17 1935 20     Temp 10/21/17 1935 98.5 F (36.9 C)     Temp Source 10/21/17 1935 Oral      SpO2 10/21/17 1935 98 %     Weight 10/21/17 1936 180 lb (81.6 kg)     Height 10/21/17 1936 5\' 2"  (1.575 m)     Head Circumference --      Peak Flow --      Pain Score 10/21/17 1935 10     Pain Loc --      Pain Edu? --      Excl. in GC? --     Constitutional: Alert and oriented. Well appearing and in no acute distress. Eyes: Conjunctivae are normal. Head: Atraumatic. Nose: No congestion/rhinnorhea. Mouth/Throat: Mucous membranes are moist.  Oropharynx non-erythematous. Neck: No stridor.  Cardiovascular: Normal rate, regular rhythm. Grossly normal heart sounds.  Good peripheral circulation. Respiratory: Normal respiratory effort.  No retractions. Lungs CTAB. Gastrointestinal: Soft and nontender. No distention. No abdominal bruits. bilateral CVA tenderness left worse than right Musculoskeletal: No lower extremity tenderness nor edema.  No joint effusions. Neurologic:  Normal speech and language. No gross focal neurologic deficits are appreciated.  Skin:  Skin is warm, dry and intact. No rash noted. Psychiatric: Mood and affect are normal. Speech and behavior are normal.  ____________________________________________   LABS (all labs ordered are listed, but only abnormal results are displayed)  Labs Reviewed  URINALYSIS, COMPLETE (UACMP) WITH MICROSCOPIC - Abnormal; Notable for the following components:      Result Value   Color, Urine YELLOW (*)    APPearance CLOUDY (*)    Glucose, UA >=500 (*)    Hgb urine dipstick SMALL (*)    Leukocytes, UA LARGE (*)    Squamous Epithelial / LPF 0-5 (*)    All other components within normal limits  BASIC METABOLIC PANEL - Abnormal; Notable for the following components:   Glucose, Bld 172 (*)    All other components within normal limits  CBC - Abnormal; Notable for the following components:   RDW 14.6 (*)    All other components within normal limits    ____________________________________________  EKG   ____________________________________________  RADIOLOGY  ED MD interpretation: finger x-rays negative however the whole hand is bruised and tender L x-ray and as wellx-ray of the hand is negative as well  Official radiology report(s): Dg Hand  Complete Left  Result Date: 10/21/2017 CLINICAL DATA:  Left hand pain after fall yesterday. EXAM: LEFT HAND - COMPLETE 3+ VIEW COMPARISON:  Third digit radiograph earlier this day at 1947 hour. Hand radiograph 12/17/2013 FINDINGS: There is no evidence of acute fracture or dislocation. Linear lucency near the tip of the third digit distal phalanx is unchanged from 2015 exam. No significant arthropathy. Soft tissues are unremarkable. IMPRESSION: No fracture or acute osseous abnormality of the left hand. Electronically Signed   By: Rubye Oaks M.D.   On: 10/21/2017 23:28   Dg Finger Middle Left  Result Date: 10/21/2017 CLINICAL DATA:  Fall yesterday.  Left middle finger pain EXAM: LEFT MIDDLE FINGER 2+V COMPARISON:  12/17/2013 FINDINGS: Linear lucency near the tip of the distal phalanx is stable since 2015 and may be related to old injury. No acute fracture, subluxation or dislocation. Soft tissues are intact. IMPRESSION: No acute bony abnormality. Electronically Signed   By: Charlett Nose M.D.   On: 10/21/2017 20:07    ____________________________________________   PROCEDURES  Procedure(s) performed:   Procedures  Critical Care performed:   ____________________________________________   INITIAL IMPRESSION / ASSESSMENT AND PLAN / ED COURSE  }     Clinical Course as of Oct 22 28  Tue Oct 21, 2017  2237 Chloride: 104 [PM]    Clinical Course User Index [PM] Arnaldo Natal, MD    will sign the patient out to Dr. Zenda Alpers ____________________________________________   FINAL CLINICAL IMPRESSION(S) / ED DIAGNOSES  Final diagnoses:  Intractable episodic headache,  unspecified headache type  Left flank pain     ED Discharge Orders    None       Note:  This document was prepared using Dragon voice recognition software and may include unintentional dictation errors.    Arnaldo Natal, MD 10/22/17 0030

## 2017-10-21 NOTE — ED Triage Notes (Addendum)
Patient to ER for c/o "migraine" that began on Sunday. States he has h/o the same, feels like her typical migraine. Also reports left sided flank pain. States she knows she has stones in kidneys, "don't know if it's moving". Patient has been taking Excedrin Migraine with no relief. States she is waiting for prescription for migraine medication from her MD (piece of dosing was missing from prescription).  Patient also reports fall yesterday (stepped on air vent grate and fell through it). Denies any dizziness. Does have abrasions present to inner and outer aspect of left leg. Reports pain to left 3rd finger.

## 2017-10-21 NOTE — ED Notes (Signed)
MD at bedside. 

## 2017-10-22 ENCOUNTER — Other Ambulatory Visit: Payer: No Typology Code available for payment source

## 2017-10-22 ENCOUNTER — Emergency Department: Payer: No Typology Code available for payment source

## 2017-10-22 MED ORDER — BUTALBITAL-APAP-CAFFEINE 50-325-40 MG PO TABS
ORAL_TABLET | ORAL | Status: AC
  Filled 2017-10-22: qty 1

## 2017-10-22 MED ORDER — BUTALBITAL-APAP-CAFFEINE 50-325-40 MG PO TABS: 2.0000 | ORAL_TABLET | Freq: Once | ORAL | Status: DC

## 2017-10-22 NOTE — ED Notes (Signed)
Pt refusing to stay for further treatment. Pt sts, "Im done with this hospital. [facility] wont even give me a damn percocet. Ill find something myself."

## 2017-10-22 NOTE — ED Provider Notes (Signed)
-----------------------------------------   2:03 AM on 10/22/2017 -----------------------------------------   Blood pressure (!) 158/99, pulse 89, temperature 98.5 F (36.9 C), temperature source Oral, resp. rate 17, height 5\' 2"  (1.575 m), weight 81.6 kg (180 lb), SpO2 99 %.  Assuming care from Dr. Darnelle CatalanMalinda.  In short, Einar GradDianna B Vear Clockhillips is a [age] y.o. female with a chief complaint of Headache and Flank Pain .  Refer to the original H&P for additional details.  The current plan of care is to follow up the result of the renal ultrasound.   Clinical Course as of Oct 22 201  Tue Oct 21, 2017  2237 Chloride: 104 [PM]    Clinical Course User Index [PM] Arnaldo NatalMalinda, Paul F, MD   As I was awaiting the results of the ultrasound I was informed by the tech that the patient's headache had returned.  The patient had been treated with Dilaudid for her headache initially.  She has a significant amount of medication allergies.  I did initially order some Tylenol for the patient.  The nurse came back and stated that the patient was requesting either another dose of Dilaudid or some morphine as she states that is the only thing that works for her headaches.  I did inform the staff that narcotics create rebound headaches and offered Fioricet in its place.  The nursing staff came back and stated that the patient refused the Fioricet.  She reports that the only thing that works for her headaches is morphine or Dilaudid.  She told the staff that she would leave if we were going to give her any more narcotics for her headache.  I again reiterated that we do not treat headaches with narcotics but I had not yet been able to get into the patient's room.  The staff reports that the patient states she would go and buy some narcotics off of the street or go and get some Dilaudid from her father.  I did instruct the staff that this was very concerning for some drug-seeking behavior.  At this time I was still awaiting the results of  the ultrasound and caring for another critical patient.  I instructed the staff that if the patient wanted to leave she would have to sign out AGAINST MEDICAL ADVICE.  The patient decided that if we would not give her Dilaudid, morphine or fentanyl that she was going to leave and she signed out AGAINST MEDICAL ADVICE.  After the patient left her ultrasound results returned showing bilateral nonobstructing renal calculi and patent ureters.   Rebecka ApleyWebster, Radley Barto P, MD 10/22/17 [phone]

## 2017-10-22 NOTE — ED Notes (Signed)
Responding to call bell, pt OOB stating "if the doctor isnt going to do anything about my headache, Im leaving".  MD and RN notified.  Pt states "I can only have Morphine or Dilaudid".  MD notified.

## 2017-10-22 NOTE — ED Notes (Signed)
Pt refusing fioricet medication. Pt sts, "I need morphine, it last 9 hours for me. Im going to go home and I hope I have a stroke. Because yall want give me pain medication. You only gave me a small dose of dilaudid and that's not enough. Im going to get something to make this headache go away, [place] [address] [place]."

## 2017-10-26 ENCOUNTER — Emergency Department: Payer: No Typology Code available for payment source

## 2017-10-26 ENCOUNTER — Other Ambulatory Visit: Payer: Self-pay

## 2017-10-26 ENCOUNTER — Emergency Department
Admission: EM | Admit: 2017-10-26 | Discharge: 2017-10-26 | Disposition: A | Discharge status: Home / Self Care | Payer: No Typology Code available for payment source | Attending: Emergency Medicine | Admitting: Emergency Medicine

## 2017-10-26 DIAGNOSIS — S8012XA Contusion of left lower leg, initial encounter: Secondary | ICD-10-CM | POA: Diagnosis not present

## 2017-10-26 DIAGNOSIS — I10 Essential (primary) hypertension: Secondary | ICD-10-CM | POA: Diagnosis not present

## 2017-10-26 DIAGNOSIS — Z79899 Other long term (current) drug therapy: Secondary | ICD-10-CM | POA: Diagnosis not present

## 2017-10-26 DIAGNOSIS — Y929 Unspecified place or not applicable: Secondary | ICD-10-CM | POA: Diagnosis not present

## 2017-10-26 DIAGNOSIS — F17210 Nicotine dependence, cigarettes, uncomplicated: Secondary | ICD-10-CM | POA: Insufficient documentation

## 2017-10-26 DIAGNOSIS — Z7901 Long term (current) use of anticoagulants: Secondary | ICD-10-CM | POA: Diagnosis not present

## 2017-10-26 DIAGNOSIS — E119 Type 2 diabetes mellitus without complications: Secondary | ICD-10-CM | POA: Diagnosis not present

## 2017-10-26 DIAGNOSIS — W133XXA Fall through floor, initial encounter: Secondary | ICD-10-CM | POA: Insufficient documentation

## 2017-10-26 DIAGNOSIS — Y939 Activity, unspecified: Secondary | ICD-10-CM | POA: Insufficient documentation

## 2017-10-26 DIAGNOSIS — Z7984 Long term (current) use of oral hypoglycemic drugs: Secondary | ICD-10-CM | POA: Insufficient documentation

## 2017-10-26 DIAGNOSIS — Z23 Encounter for immunization: Secondary | ICD-10-CM | POA: Insufficient documentation

## 2017-10-26 DIAGNOSIS — Y999 Unspecified external cause status: Secondary | ICD-10-CM | POA: Diagnosis not present

## 2017-10-26 DIAGNOSIS — T07XXXA Unspecified multiple injuries, initial encounter: Secondary | ICD-10-CM

## 2017-10-26 MED ORDER — OXYCODONE-ACETAMINOPHEN 5-325 MG PO TABS
1.0000 | ORAL_TABLET | Freq: Once | ORAL | Status: AC
  Administered 2017-10-26: 1 via ORAL
  Filled 2017-10-26: qty 1

## 2017-10-26 MED ORDER — OXYCODONE-ACETAMINOPHEN 5-325 MG PO TABS: 1.0000 | ORAL_TABLET | Freq: Four times a day (QID) | ORAL | 0 refills | Status: DC | PRN

## 2017-10-26 MED ORDER — TETANUS-DIPHTH-ACELL PERTUSSIS 5-2.5-18.5 LF-MCG/0.5 IM SUSP
0.5000 mL | Freq: Once | INTRAMUSCULAR | Status: AC
  Administered 2017-10-26: 0.5 mL via INTRAMUSCULAR
  Filled 2017-10-26: qty 0.5

## 2017-10-26 NOTE — ED Notes (Signed)
See triage note  States she fell thur a register 2 days ago  conts to have left leg pain  States she is not able to bear wt to left leg  Was able to walk some with cane  Abrasions noted to lateral aspects of leg  Knee and lower leg tender on palpation  No deformity noted

## 2017-10-26 NOTE — Discharge Instructions (Signed)
Follow-up with your primary care doctor if any continued problems.  Any further pain medication will need to come from your primary care doctor.  This medication could cause drowsiness and increase your risk for falling.  Clean abrasions daily with mild soap and water.  Watch for any signs of infection.  You may use ice to your leg as needed for discomfort.  X-rays did not show a fracture.

## 2017-10-26 NOTE — ED Provider Notes (Signed)
[facility] Emergency Department Provider Note  ____________________________________________   First MD Initiated Contact with Patient 10/26/17 0720     (approximate)  I have reviewed the triage vital signs and the nursing notes.   HISTORY  Chief Complaint Leg Injury   HPI Stephanie [name] is a 52 y.o. female is here with complaint of left leg pain.  Patient states that she fell through a heating register in the floor 2 days ago.  She was seen in the ED at that time but was treated for a migraine.  She states that no one looked at her leg and she has continued to have pain.  Patient does have abrasions.  She states that she has had difficulty walking since her accident.  She also is unsure of her last tetanus immunization.  She rates her pain as a 10/10.   Past Medical History:  Diagnosis Date  . Diabetes mellitus without complication (HCC)   . Diverticulitis   . Headache   . Hepatitis C   . History of kidney stones   . Hypertension   . IBS (irritable bowel syndrome)   . Kidney stones   . Sciatica   . Sleep apnea    on cpap  . Stroke [facility])     Patient Active Problem List   Diagnosis Date Noted  . UTI (urinary tract infection) 08/21/2017  . Nausea & vomiting 08/21/2017  . Nicotine dependence 08/21/2017  . Headache 01/07/2017  . Small bowel obstruction (HCC) 09/02/2016  . Pain and swelling of left upper extremity 07/22/2016  . Superficial venous thrombosis of arm, left 07/22/2016  . ICH (intracerebral hemorrhage) (HCC)   . Essential hypertension, malignant 04/19/2016  . Cytotoxic brain edema (HCC) 04/19/2016  . IVH (intraventricular hemorrhage) (HCC) 04/18/2016  . Hepatitis C     Past Surgical History:  Procedure Laterality Date  . ABDOMINAL HYSTERECTOMY    . carpel tunnel syndrome  2017  . HUMERUS IM NAIL Right 03/11/2017   Procedure: INTRAMEDULLARY (IM) NAIL HUMERAL;  Surgeon: Lyndle Herrlich, MD;  Location: [facility] ORS;  Service:  Orthopedics;  Laterality: Right;  . KIDNEY STONE SURGERY Right 02/12/12  . KIDNEY STONE SURGERY Left 07/15/2012  . LIGATION OF ARTERIOVENOUS  FISTULA Left 06/21/2016   Procedure: LIGATION OF ARTERIOVENOUS  FISTULA ( LIGATION BASILIC VEIN );  Surgeon: Renford Dills, MD;  Location: [facility] ORS;  Service: Vascular;  Laterality: Left;  . LITHOTRIPSY    . OOPHORECTOMY    . SINUS EXPLORATION    . TONSILLECTOMY      Prior to Admission medications   Medication Sig Start Date End Date Taking? Authorizing Provider  acetaminophen (TYLENOL) 500 MG tablet Take 1,000 mg by mouth 2 (two) times daily.    [provider]  alprazolam Prudy Feeler) 2 MG tablet Take 1 mg by mouth 2 (two) times daily.     [provider]  amLODipine (NORVASC) 10 MG tablet Take 10 mg by mouth daily.    [provider]  atorvastatin (LIPITOR) 40 MG tablet Take 40 mg by mouth every evening.    [provider]  baclofen (LIORESAL) 10 MG tablet Take 10 mg by mouth 2 (two) times daily as needed for muscle spasms.  06/18/17   [provider]  butalbital-acetaminophen-caffeine (FIORICET, ESGIC) [phone] MG tablet  05/14/17   [provider]  calcipotriene (DOVONOX) 0.005 % cream  07/17/17   [provider]  Cholecalciferol (VITAMIN D) 2000 units CAPS Take 2,000 Units  by mouth daily.    [provider]  clopidogrel (PLAVIX) 75 MG tablet Take 1 tablet (75 mg total) by mouth daily. 06/27/16   Marvel Plan, MD  cyclobenzaprine (FLEXERIL) 10 MG tablet Take 10 mg by mouth 3 (three) times daily as needed for muscle spasms.    [provider]  diphenoxylate-atropine (LOMOTIL) 2.5-0.025 MG tablet Take 1 tablet by mouth 4 (four) times daily as needed for diarrhea or loose stools.    [provider]  DULoxetine (CYMBALTA) 60 MG capsule Take 60 mg by mouth daily.    [provider]  glipiZIDE (GLUCOTROL) 5 MG tablet Take 1 tablet (5 mg total) by mouth 2 (two)  times daily before a meal. 04/30/16   Lora Paula, MD  hydrochlorothiazide (HYDRODIURIL) 25 MG tablet Take 25 mg by mouth daily.  07/01/17   [provider]  hyoscyamine (LEVSIN, ANASPAZ) 0.125 MG tablet Take 0.125 mg by mouth every 6 (six) hours as needed.  08/08/17   [provider]  INVOKANA 300 MG TABS tablet Take 300 mg by mouth daily before breakfast.     [provider]  levETIRAcetam (KEPPRA) 250 MG tablet  07/18/17   [provider]  levETIRAcetam (KEPPRA) 500 MG tablet Take 1 tablet (500 mg total) by mouth 2 (two) times daily. 04/30/16   Lora Paula, MD  loratadine (CLARITIN) 10 MG tablet Take 10 mg by mouth daily.    [provider]  losartan (COZAAR) 100 MG tablet Take 100 mg by mouth daily.  08/08/17   [provider]  losartan (COZAAR) 50 MG tablet Take 50 mg by mouth 2 (two) times daily.    [provider]  meloxicam (MOBIC) 7.5 MG tablet  05/14/17   [provider]  metoprolol (LOPRESSOR) 50 MG tablet Take 50 mg by mouth 2 (two) times daily.     [provider]  omeprazole (PRILOSEC) 40 MG capsule Take 40 mg by mouth daily.    [provider]  oxyCODONE-acetaminophen (PERCOCET) 5-325 MG tablet Take 1 tablet by mouth every 6 (six) hours as needed for severe pain. 10/26/17   Tommi Rumps, PA-C  promethazine (PHENERGAN) 25 MG tablet Take 25 mg by mouth every 6 (six) hours as needed for nausea or vomiting.  08/08/17   [provider]  propranolol ER (INDERAL LA) 60 MG 24 hr capsule Take 60 mg by mouth daily.  08/08/17   [provider]  tiZANidine (ZANAFLEX) 4 MG tablet Take 4 mg by mouth daily.  08/08/17   [provider]  triamcinolone (KENALOG) 0.147 MG/GM topical spray  07/17/17   [provider]  zolpidem (AMBIEN) 10 MG tablet Take 10 mg by mouth at bedtime.  08/14/17   [provider]    Allergies Aspirin; Buprenorphine hcl; Codeine;  Compazine; Ioxaglate; Ivp dye [iodinated diagnostic agents]; Metrizamide; Naproxen; Norco [hydrocodone-acetaminophen]; Penicillin g; Penicillins; Prochlorperazine maleate; Reglan [metoclopramide]; Sulfur; Tegretol [carbamazepine]; Toradol [ketorolac tromethamine]; and Tramadol  Family History  Problem Relation Age of Onset  . Heart failure Mother   . Diabetes Father   . Cancer Sister     Social History Social History   Tobacco Use  . Smoking status: Current Every Day Smoker    Packs/day: 0.25    Years: 1.50    Pack years: 0.37    Types: Cigarettes  . Smokeless tobacco: Never Used  Substance Use Topics  . Alcohol use: No    Comment: no alcohol since 2002  .  Drug use: No    Comment: electric cig    Review of Systems Constitutional: No fever/chills Cardiovascular: Denies chest pain. Respiratory: Denies shortness of breath. Gastrointestinal: No abdominal pain.  No nausea, no vomiting.   Musculoskeletal: Positive for left leg pain.  Negative for back pain. Skin: Positive for multiple abrasions left leg. Neurological: Negative for headaches, focal weakness or numbness. ___________________________________________   PHYSICAL EXAM:  VITAL SIGNS: ED Triage Vitals [10/26/17 0608]  Enc Vitals Group     BP (!) 143/100     Pulse Rate 86     Resp 12     Temp 97.6 F (36.4 C)     Temp Source Oral     SpO2 97 %     Weight 185 lb (83.9 kg)     Height 5\' 2"  (1.575 m)     Head Circumference      Peak Flow      Pain Score 10     Pain Loc      Pain Edu?      Excl. in GC?    Constitutional: Alert and oriented. Well appearing and in no acute distress. Eyes: Conjunctivae are normal.  Head: Atraumatic. Neck: No stridor.   Cardiovascular: Normal rate, regular rhythm. Grossly normal heart sounds.  Good peripheral circulation. Respiratory: Normal respiratory effort.  No retractions. Lungs CTAB. Gastrointestinal: Soft and nontender. No distention.  Musculoskeletal: Examination of  the left lower extremity there is no gross deformity.  Range of motion is restricted secondary to patient's discomfort.  There is no obvious deformity or effusion to the left knee.  There is superficial abrasions noted on the medial and lateral aspect of the lower thigh and upper tib-fib area.  Areas appear to be healing without any signs of infection.  No drainage is noted.  Motor sensory function intact distal to her injuries.  Patient is able to bear weight without assistance. Neurologic:  Normal speech and language. No gross focal neurologic deficits are appreciated. No gait instability. Skin:  Skin is warm, dry.  Abrasions as noted above. Psychiatric: Mood and affect are normal. Speech and behavior are normal.  ____________________________________________   LABS (all labs ordered are listed, but only abnormal results are displayed)  Labs Reviewed - No data to display RADIOLOGY  ED MD interpretation:   Left tib-fib is negative for fracture or foreign bodies.  Official radiology report(s): Dg Tibia/fibula Left  Result Date: 10/26/2017 CLINICAL DATA:  Fall, leg injury, pain EXAM: LEFT TIBIA AND FIBULA - 2 VIEW COMPARISON:  04/10/2009 FINDINGS: Left lower extremity subcutaneous edema noted diffusely. Left tibia and fibula appear intact. No malalignment or acute osseous finding. Negative for fracture. No radiopaque foreign body. IMPRESSION: No acute osseous finding. Electronically Signed   By: Judie Petit.  Shick M.D.   On: 10/26/2017 08:34    ____________________________________________   PROCEDURES  Procedure(s) performed: None  Procedures  Critical Care performed: No  ____________________________________________   INITIAL IMPRESSION / ASSESSMENT AND PLAN / ED COURSE  As part of my medical decision making, I reviewed the following data within the electronic MEDICAL RECORD NUMBER Notes from prior ED visits and [place] Controlled Substance Database  Patient was instructed to clean abrasions daily  with mild soap and water.  She is to continue watching for any signs of infection and follow-up with her PCP if any continued problems.  Patient was given Percocet 5 mg 1 every 6 hours as needed for pain #12.  She is aware that she will need to  follow-up with her PCP if any continued pain medication as needed. ____________________________________________   FINAL CLINICAL IMPRESSION(S) / ED DIAGNOSES  Final diagnoses:  Contusion of multiple sites of left lower extremity, initial encounter  Multiple abrasions     ED Discharge Orders        Ordered    oxyCODONE-acetaminophen (PERCOCET) 5-325 MG tablet  Every 6 hours PRN     10/26/17 0908       Note:  This document was prepared using Dragon voice recognition software and may include unintentional dictation errors.    Tommi RumpsSummers, Ferlin Fairhurst L, PA-C 10/26/17 1302    Arnaldo NatalMalinda, Paul F, MD 10/26/17 [phone]

## 2017-10-26 NOTE — ED Triage Notes (Signed)
Pt states she fell through a heating register in the floor 2 days pta. Pt states she was here for a migraine after falling through register "but they didn't even look at my leg". Pt with abrasions noted to medial and lateral knee. Pt states "I can't even walk on it", however pt ambulated into ed. Pt appears to be under the influence.

## 2017-12-08 ENCOUNTER — Ambulatory Visit: Payer: Self-pay | Admitting: Orthopedic Surgery

## 2017-12-09 ENCOUNTER — Encounter
Admission: RE | Admit: 2017-12-09 | Discharge: 2017-12-09 | Disposition: A | Discharge status: Home / Self Care | Payer: No Typology Code available for payment source | Source: Ambulatory Visit | Attending: Orthopedic Surgery | Admitting: Orthopedic Surgery

## 2017-12-09 ENCOUNTER — Other Ambulatory Visit: Payer: Self-pay

## 2017-12-09 DIAGNOSIS — Z01818 Encounter for other preprocedural examination: Secondary | ICD-10-CM | POA: Insufficient documentation

## 2017-12-09 NOTE — Patient Instructions (Signed)
Your procedure is scheduled on: Mon 4/15 Report to Day Surgery. To find out your arrival time please call [phone] between 1PM - 3PM on Friday.4/12  Remember: Instructions that are not followed completely may result in serious medical risk, up to and including death, or upon the discretion of your surgeon and anesthesiologist your surgery may need to be rescheduled.     _X__ 1. Do not eat food after midnight the night before your procedure.                 No gum chewing or hard candies. You may drink clear liquids up to 2 hours                 before you are scheduled to arrive for your surgery- DO not drink clear                 liquids within 2 hours of the start of your surgery.                 Clear Liquids include:  water,  __X__2.  On the morning of surgery brush your teeth with toothpaste and water, you  may rinse your mouth with mouthwash if you wish.  Do not swallow any  toothpaste of mouthwash.     _X__ 3.  No Alcohol for 24 hours before or after surgery.   _X__ 4.  Do Not Smoke or use e-cigarettes For 24 Hours Prior to Your Surgery.                 Do not use any chewable tobacco products for at least 6 hours prior to                 surgery.  ____  5.  Bring all medications with you on the day of surgery if instructed.   _x___  6.  Notify your doctor if there is any change in your medical condition      (cold, fever, infections).     Do not wear jewelry, make-up, hairpins, clips or nail polish. Do not wear lotions, powders, or perfumes. You may wear deodorant. Do not shave 48 hours prior to surgery. Men may shave face and neck. Do not bring valuables to the hospital.    [facility] is not responsible for any belongings or valuables.  Contacts, dentures or bridgework may not be worn into surgery. Leave your suitcase in the car. After surgery it may be brought to your room. For patients admitted to the hospital, discharge time is determined by  your treatment team.   Patients discharged the day of surgery will not be allowed to drive home.   Please read over the following fact sheets that you were given:    __x__ Take these medicines the morning of surgery with A SIP OF WATER:    1. alprazolam (XANAX) 2 MG tablet  2. amLODipine (NORVASC) 10 MG tablet  3. cloNIDine (CATAPRES) 0.1 MG tablet  4.diltiazem (DILACOR XR) 180 MG 24 hr capsule  5.omeprazole (PRILOSEC) 40 MG capsule take extra dose the night before as well  6.propranolol (INDERAL) 60 MG tablet May take any "as needed" medication the morning of surgery  ____ Fleet Enema (as directed)   _x___ Use CHG Soap as directed  ____ Use inhalers on the day of surgery  ____ Stop metformin 2 days prior to surgery    ____ Take 1/2 of usual insulin dose the night before surgery. No insulin the  morning          of surgery.   __x__ Stop Plavix on per Dr. Alver FisherShaw's recommendation  ____ Stop Anti-inflammatories on    ____ Stop supplements until after surgery.    __x__ Bring C-Pap to the hospital.

## 2017-12-09 NOTE — Pre-Procedure Instructions (Signed)
Received a clearance of moderate risk from Dr. Marla RoeShan.  Patient notified to call the office for instructions on when to stop the plavix before the surgery.

## 2017-12-09 NOTE — Pre-Procedure Instructions (Signed)
AS INSTRUCTED BY DR Lorin PicketM THOMAS, REQUEST FOR NEURO CLEARANCE CALLED AND FAXED TO DR [facility]. SPOKE WITH PAULA. ALSO FAXED AND SPOKE WITH Marquette Piontek AT DR [facility] HamBOWER'S. SHE HAS ALSO REQUESTED MEDICAL CLEARANCE FROM [facility].

## 2017-12-18 NOTE — Pre-Procedure Instructions (Addendum)
SPOKE WITH [facility], PATIENT NO SHOW FOR STRESS TEST 4/9/ AND 12/17/17. Suly Vukelich MORTON AT [facility] NOTIFIED Jabri Blancett AT [facility], CALLED AND INSTRUCTED PATIENT SHE WILL BE CNL IF DOES NOT KEEP APPOINTMENT FOR 4/12  STRESS 9AM ,ECHO 12NOON, SEEING CARDIOLOGIST AT 1 PM AT [facility]

## 2017-12-21 MED ORDER — CLINDAMYCIN PHOSPHATE 900 MG/50ML IV SOLN
900.0000 mg | INTRAVENOUS | Status: AC
  Administered 2017-12-22: 900 mg via INTRAVENOUS

## 2017-12-22 ENCOUNTER — Ambulatory Visit: Payer: No Typology Code available for payment source | Admitting: Anesthesiology

## 2017-12-22 ENCOUNTER — Encounter
Admission: RE | Discharge status: Against Medical Advice | Payer: Self-pay | Source: Ambulatory Visit | Attending: Orthopedic Surgery

## 2017-12-22 ENCOUNTER — Ambulatory Visit: Payer: No Typology Code available for payment source

## 2017-12-22 ENCOUNTER — Other Ambulatory Visit: Payer: Self-pay

## 2017-12-22 ENCOUNTER — Ambulatory Visit
Admission: RE | Admit: 2017-12-22 | Discharge: 2017-12-22 | Discharge status: Against Medical Advice | Payer: No Typology Code available for payment source | Source: Ambulatory Visit | Attending: Orthopedic Surgery | Admitting: Orthopedic Surgery

## 2017-12-22 DIAGNOSIS — F17210 Nicotine dependence, cigarettes, uncomplicated: Secondary | ICD-10-CM | POA: Insufficient documentation

## 2017-12-22 DIAGNOSIS — M5430 Sciatica, unspecified side: Secondary | ICD-10-CM | POA: Insufficient documentation

## 2017-12-22 DIAGNOSIS — G4730 Sleep apnea, unspecified: Secondary | ICD-10-CM | POA: Diagnosis not present

## 2017-12-22 DIAGNOSIS — Z91041 Radiographic dye allergy status: Secondary | ICD-10-CM | POA: Insufficient documentation

## 2017-12-22 DIAGNOSIS — I10 Essential (primary) hypertension: Secondary | ICD-10-CM | POA: Insufficient documentation

## 2017-12-22 DIAGNOSIS — Z90710 Acquired absence of both cervix and uterus: Secondary | ICD-10-CM | POA: Diagnosis not present

## 2017-12-22 DIAGNOSIS — R7981 Abnormal blood-gas level: Secondary | ICD-10-CM

## 2017-12-22 DIAGNOSIS — Z7984 Long term (current) use of oral hypoglycemic drugs: Secondary | ICD-10-CM | POA: Diagnosis not present

## 2017-12-22 DIAGNOSIS — M25511 Pain in right shoulder: Secondary | ICD-10-CM | POA: Diagnosis present

## 2017-12-22 DIAGNOSIS — Z885 Allergy status to narcotic agent status: Secondary | ICD-10-CM | POA: Insufficient documentation

## 2017-12-22 DIAGNOSIS — Z79899 Other long term (current) drug therapy: Secondary | ICD-10-CM | POA: Insufficient documentation

## 2017-12-22 DIAGNOSIS — Z8249 Family history of ischemic heart disease and other diseases of the circulatory system: Secondary | ICD-10-CM | POA: Insufficient documentation

## 2017-12-22 DIAGNOSIS — Z472 Encounter for removal of internal fixation device: Secondary | ICD-10-CM | POA: Diagnosis present

## 2017-12-22 DIAGNOSIS — T8484XA Pain due to internal orthopedic prosthetic devices, implants and grafts, initial encounter: Secondary | ICD-10-CM | POA: Diagnosis not present

## 2017-12-22 DIAGNOSIS — Z888 Allergy status to other drugs, medicaments and biological substances status: Secondary | ICD-10-CM | POA: Insufficient documentation

## 2017-12-22 DIAGNOSIS — Z882 Allergy status to sulfonamides status: Secondary | ICD-10-CM | POA: Insufficient documentation

## 2017-12-22 DIAGNOSIS — K589 Irritable bowel syndrome without diarrhea: Secondary | ICD-10-CM | POA: Diagnosis not present

## 2017-12-22 DIAGNOSIS — Z87442 Personal history of urinary calculi: Secondary | ICD-10-CM | POA: Diagnosis not present

## 2017-12-22 DIAGNOSIS — X58XXXA Exposure to other specified factors, initial encounter: Secondary | ICD-10-CM | POA: Diagnosis not present

## 2017-12-22 DIAGNOSIS — Z8052 Family history of malignant neoplasm of bladder: Secondary | ICD-10-CM | POA: Insufficient documentation

## 2017-12-22 DIAGNOSIS — E119 Type 2 diabetes mellitus without complications: Secondary | ICD-10-CM | POA: Insufficient documentation

## 2017-12-22 DIAGNOSIS — Z8673 Personal history of transient ischemic attack (TIA), and cerebral infarction without residual deficits: Secondary | ICD-10-CM | POA: Insufficient documentation

## 2017-12-22 DIAGNOSIS — Z880 Allergy status to penicillin: Secondary | ICD-10-CM | POA: Insufficient documentation

## 2017-12-22 DIAGNOSIS — Z886 Allergy status to analgesic agent status: Secondary | ICD-10-CM | POA: Insufficient documentation

## 2017-12-22 DIAGNOSIS — R51 Headache: Secondary | ICD-10-CM | POA: Diagnosis not present

## 2017-12-22 DIAGNOSIS — Z833 Family history of diabetes mellitus: Secondary | ICD-10-CM | POA: Diagnosis not present

## 2017-12-22 DIAGNOSIS — B1920 Unspecified viral hepatitis C without hepatic coma: Secondary | ICD-10-CM | POA: Insufficient documentation

## 2017-12-22 HISTORY — PX: HARDWARE REMOVAL: SHX979

## 2017-12-22 HISTORY — DX: Failed or difficult intubation, initial encounter: T88.4XXA

## 2017-12-22 LAB — GLUCOSE, CAPILLARY
GLUCOSE-CAPILLARY: 109 mg/dL — AB (ref 65–99)
GLUCOSE-CAPILLARY: 108 mg/dL — AB (ref 65–99)
Glucose-Capillary: 190 mg/dL — ABNORMAL HIGH (ref 65–99)

## 2017-12-22 SURGERY — REMOVAL, HARDWARE
Anesthesia: General | Laterality: Right

## 2017-12-22 MED ORDER — ROCURONIUM BROMIDE 100 MG/10ML IV SOLN
INTRAVENOUS | Status: DC | PRN
  Administered 2017-12-22: 10 mg via INTRAVENOUS
  Administered 2017-12-22: 40 mg via INTRAVENOUS

## 2017-12-22 MED ORDER — LACTATED RINGERS IV SOLN: INTRAVENOUS | Status: DC

## 2017-12-22 MED ORDER — DOCUSATE SODIUM 100 MG PO CAPS: 100.0000 mg | ORAL_CAPSULE | Freq: Every day | ORAL | 2 refills | Status: DC | PRN

## 2017-12-22 MED ORDER — OXYCODONE HCL 5 MG PO TABS: 5.0000 mg | ORAL_TABLET | ORAL | Status: DC | PRN

## 2017-12-22 MED ORDER — FENTANYL CITRATE (PF) 100 MCG/2ML IJ SOLN
INTRAMUSCULAR | Status: AC
  Administered 2017-12-22: 50 ug via INTRAVENOUS
  Filled 2017-12-22: qty 2

## 2017-12-22 MED ORDER — DEXAMETHASONE SODIUM PHOSPHATE 10 MG/ML IJ SOLN
INTRAMUSCULAR | Status: DC | PRN
  Administered 2017-12-22: 10 mg via INTRAVENOUS

## 2017-12-22 MED ORDER — OXYCODONE HCL 5 MG PO TABS: 10.0000 mg | ORAL_TABLET | ORAL | Status: DC | PRN

## 2017-12-22 MED ORDER — SUCCINYLCHOLINE CHLORIDE 20 MG/ML IJ SOLN
INTRAMUSCULAR | Status: AC
Start: 2017-12-22 — End: 2017-12-22
  Filled 2017-12-22: qty 1

## 2017-12-22 MED ORDER — BUPIVACAINE LIPOSOME 1.3 % IJ SUSP
INTRAMUSCULAR | Status: DC | PRN
  Administered 2017-12-22: 7 mL via PERINEURAL
  Administered 2017-12-22: 13 mL via PERINEURAL

## 2017-12-22 MED ORDER — PROPOFOL 10 MG/ML IV BOLUS
INTRAVENOUS | Status: DC | PRN
  Administered 2017-12-22: 150 mg via INTRAVENOUS

## 2017-12-22 MED ORDER — MENTHOL 3 MG MT LOZG
1.0000 | LOZENGE | OROMUCOSAL | Status: DC | PRN
  Filled 2017-12-22: qty 9

## 2017-12-22 MED ORDER — LIDOCAINE HCL (PF) 2 % IJ SOLN
INTRAMUSCULAR | Status: AC
  Filled 2017-12-22: qty 10

## 2017-12-22 MED ORDER — FENTANYL CITRATE (PF) 100 MCG/2ML IJ SOLN
INTRAMUSCULAR | Status: AC
  Filled 2017-12-22: qty 2

## 2017-12-22 MED ORDER — MIDAZOLAM HCL 2 MG/2ML IJ SOLN
1.0000 mg | Freq: Once | INTRAMUSCULAR | Status: AC
  Administered 2017-12-22: 1 mg via INTRAVENOUS

## 2017-12-22 MED ORDER — GABAPENTIN 300 MG PO CAPS: 300.0000 mg | ORAL_CAPSULE | Freq: Once | ORAL | Status: DC

## 2017-12-22 MED ORDER — MIDAZOLAM HCL 2 MG/2ML IJ SOLN
INTRAMUSCULAR | Status: DC | PRN
  Administered 2017-12-22: 2 mg via INTRAVENOUS

## 2017-12-22 MED ORDER — EPHEDRINE SULFATE 50 MG/ML IJ SOLN
INTRAMUSCULAR | Status: DC | PRN
  Administered 2017-12-22: 10 mg via INTRAVENOUS

## 2017-12-22 MED ORDER — MIDAZOLAM HCL 2 MG/2ML IJ SOLN
INTRAMUSCULAR | Status: AC
  Filled 2017-12-22: qty 2

## 2017-12-22 MED ORDER — BUPIVACAINE LIPOSOME 1.3 % IJ SUSP
INTRAMUSCULAR | Status: AC
  Filled 2017-12-22: qty 20

## 2017-12-22 MED ORDER — ONDANSETRON HCL 4 MG/2ML IJ SOLN: 4.0000 mg | Freq: Four times a day (QID) | INTRAMUSCULAR | Status: DC | PRN

## 2017-12-22 MED ORDER — PROPOFOL 10 MG/ML IV BOLUS
INTRAVENOUS | Status: AC
  Filled 2017-12-22: qty 20

## 2017-12-22 MED ORDER — BUPIVACAINE HCL (PF) 0.5 % IJ SOLN
INTRAMUSCULAR | Status: AC
  Filled 2017-12-22: qty 10

## 2017-12-22 MED ORDER — FENTANYL CITRATE (PF) 100 MCG/2ML IJ SOLN
50.0000 ug | Freq: Once | INTRAMUSCULAR | Status: AC
  Administered 2017-12-22: 50 ug via INTRAVENOUS

## 2017-12-22 MED ORDER — METOCLOPRAMIDE HCL 5 MG/ML IJ SOLN: 5.0000 mg | Freq: Three times a day (TID) | INTRAMUSCULAR | Status: DC | PRN

## 2017-12-22 MED ORDER — ROCURONIUM BROMIDE 50 MG/5ML IV SOLN
INTRAVENOUS | Status: AC
  Filled 2017-12-22: qty 1

## 2017-12-22 MED ORDER — HYDROMORPHONE HCL 1 MG/ML IJ SOLN
0.5000 mg | INTRAMUSCULAR | Status: AC | PRN
  Administered 2017-12-22 (×4): 0.5 mg via INTRAVENOUS

## 2017-12-22 MED ORDER — DEXAMETHASONE SODIUM PHOSPHATE 10 MG/ML IJ SOLN
INTRAMUSCULAR | Status: AC
  Filled 2017-12-22: qty 1

## 2017-12-22 MED ORDER — PHENOL 1.4 % MT LIQD
1.0000 | OROMUCOSAL | Status: DC | PRN
  Filled 2017-12-22: qty 177

## 2017-12-22 MED ORDER — HYDROMORPHONE HCL 1 MG/ML IJ SOLN
0.5000 mg | INTRAMUSCULAR | Status: DC | PRN
  Administered 2017-12-22 (×2): 0.5 mg via INTRAVENOUS

## 2017-12-22 MED ORDER — SUCCINYLCHOLINE CHLORIDE 20 MG/ML IJ SOLN
INTRAMUSCULAR | Status: DC | PRN
  Administered 2017-12-22: 100 mg via INTRAVENOUS

## 2017-12-22 MED ORDER — HYDROMORPHONE HCL 1 MG/ML IJ SOLN: 0.5000 mg | INTRAMUSCULAR | Status: DC | PRN

## 2017-12-22 MED ORDER — OXYCODONE HCL 5 MG PO TABS: 5.0000 mg | ORAL_TABLET | Freq: Three times a day (TID) | ORAL | 0 refills | Status: DC | PRN

## 2017-12-22 MED ORDER — CLINDAMYCIN PHOSPHATE 900 MG/50ML IV SOLN
INTRAVENOUS | Status: AC
  Filled 2017-12-22: qty 50

## 2017-12-22 MED ORDER — SUGAMMADEX SODIUM 200 MG/2ML IV SOLN
INTRAVENOUS | Status: AC
  Filled 2017-12-22: qty 2

## 2017-12-22 MED ORDER — ONDANSETRON HCL 4 MG PO TABS: 4.0000 mg | ORAL_TABLET | Freq: Four times a day (QID) | ORAL | Status: DC | PRN

## 2017-12-22 MED ORDER — ACETAMINOPHEN 500 MG PO TABS
ORAL_TABLET | ORAL | Status: AC
  Filled 2017-12-22: qty 2

## 2017-12-22 MED ORDER — METOCLOPRAMIDE HCL 10 MG PO TABS: 5.0000 mg | ORAL_TABLET | Freq: Three times a day (TID) | ORAL | Status: DC | PRN

## 2017-12-22 MED ORDER — ACETAMINOPHEN 500 MG PO TABS: 1000.0000 mg | ORAL_TABLET | Freq: Once | ORAL | Status: DC

## 2017-12-22 MED ORDER — GABAPENTIN 300 MG PO CAPS
ORAL_CAPSULE | ORAL | Status: AC
  Filled 2017-12-22: qty 1

## 2017-12-22 MED ORDER — SODIUM CHLORIDE 0.9 % IV SOLN
INTRAVENOUS | Status: DC
  Administered 2017-12-22 (×2): via INTRAVENOUS

## 2017-12-22 MED ORDER — BUPIVACAINE-EPINEPHRINE 0.25% -1:200000 IJ SOLN
INTRAMUSCULAR | Status: DC | PRN
  Administered 2017-12-22: 10 mL

## 2017-12-22 MED ORDER — CHLORHEXIDINE GLUCONATE 4 % EX LIQD: 60.0000 mL | Freq: Once | CUTANEOUS | Status: DC

## 2017-12-22 MED ORDER — HYDROMORPHONE HCL 1 MG/ML IJ SOLN
INTRAMUSCULAR | Status: AC
  Administered 2017-12-22: 0.5 mg via INTRAVENOUS
  Filled 2017-12-22: qty 1

## 2017-12-22 MED ORDER — BACITRACIN 50000 UNITS IM SOLR
INTRAMUSCULAR | Status: DC | PRN
  Administered 2017-12-22: 1000 mL

## 2017-12-22 MED ORDER — DOCUSATE SODIUM 100 MG PO CAPS: 100.0000 mg | ORAL_CAPSULE | Freq: Two times a day (BID) | ORAL | Status: DC

## 2017-12-22 MED ORDER — GLYCOPYRROLATE 0.2 MG/ML IJ SOLN
INTRAMUSCULAR | Status: DC | PRN
  Administered 2017-12-22: 0.2 mg via INTRAVENOUS

## 2017-12-22 MED ORDER — ONDANSETRON HCL 4 MG/2ML IJ SOLN
INTRAMUSCULAR | Status: DC | PRN
  Administered 2017-12-22: 4 mg via INTRAVENOUS

## 2017-12-22 MED ORDER — ONDANSETRON HCL 4 MG/2ML IJ SOLN
INTRAMUSCULAR | Status: AC
  Filled 2017-12-22: qty 2

## 2017-12-22 MED ORDER — MIDAZOLAM HCL 2 MG/2ML IJ SOLN
INTRAMUSCULAR | Status: AC
  Administered 2017-12-22: 1 mg via INTRAVENOUS
  Filled 2017-12-22: qty 2

## 2017-12-22 MED ORDER — SODIUM CHLORIDE 0.9 % IV SOLN
INTRAVENOUS | Status: DC | PRN
  Administered 2017-12-22: 50 ug/min via INTRAVENOUS

## 2017-12-22 MED ORDER — FENTANYL CITRATE (PF) 100 MCG/2ML IJ SOLN
INTRAMUSCULAR | Status: DC | PRN
  Administered 2017-12-22 (×2): 25 ug via INTRAVENOUS
  Administered 2017-12-22: 50 ug via INTRAVENOUS

## 2017-12-22 MED ORDER — FENTANYL CITRATE (PF) 100 MCG/2ML IJ SOLN: 25.0000 ug | INTRAMUSCULAR | Status: DC | PRN

## 2017-12-22 MED ORDER — GLIPIZIDE 5 MG PO TABS
5.0000 mg | ORAL_TABLET | Freq: Two times a day (BID) | ORAL | Status: DC
  Filled 2017-12-22: qty 1

## 2017-12-22 MED ORDER — LIDOCAINE HCL (PF) 1 % IJ SOLN
INTRAMUSCULAR | Status: AC
  Filled 2017-12-22: qty 5

## 2017-12-22 MED ORDER — BUPIVACAINE HCL (PF) 0.5 % IJ SOLN
INTRAMUSCULAR | Status: DC | PRN
  Administered 2017-12-22: 7 mL via PERINEURAL
  Administered 2017-12-22: 3 mL via PERINEURAL

## 2017-12-22 SURGICAL SUPPLY — 30 items
BANDAGE ACE 4X5 VEL STRL LF (GAUZE/BANDAGES/DRESSINGS) ×3 IMPLANT
BNDG COHESIVE 4X5 TAN STRL (GAUZE/BANDAGES/DRESSINGS) ×3 IMPLANT
BRUSH SCRUB 4% CHG (MISCELLANEOUS) ×3 IMPLANT
CANISTER SUCT 1200ML W/VALVE (MISCELLANEOUS) ×3 IMPLANT
CHLORAPREP W/TINT 26ML (MISCELLANEOUS) ×3 IMPLANT
DRAPE FLUOR MINI C-ARM 54X84 (DRAPES) ×3 IMPLANT
DRAPE INCISE IOBAN 66X45 STRL (DRAPES) IMPLANT
ELECT CAUTERY BLADE 6.4 (BLADE) ×3 IMPLANT
ELECT REM PT RETURN 9FT ADLT (ELECTROSURGICAL) ×3
ELECTRODE REM PT RTRN 9FT ADLT (ELECTROSURGICAL) ×1 IMPLANT
GAUZE PETRO XEROFOAM 1X8 (MISCELLANEOUS) ×3 IMPLANT
GAUZE SPONGE 4X4 12PLY STRL (GAUZE/BANDAGES/DRESSINGS) ×3 IMPLANT
GLOVE INDICATOR 8.0 STRL GRN (GLOVE) ×3 IMPLANT
GLOVE SURG ORTHO 8.0 STRL STRW (GLOVE) ×6 IMPLANT
GOWN STRL REUS W/ TWL LRG LVL3 (GOWN DISPOSABLE) ×1 IMPLANT
GOWN STRL REUS W/ TWL XL LVL3 (GOWN DISPOSABLE) ×1 IMPLANT
GOWN STRL REUS W/TWL LRG LVL3 (GOWN DISPOSABLE) ×3
GOWN STRL REUS W/TWL XL LVL3 (GOWN DISPOSABLE) ×3
KIT TURNOVER KIT A (KITS) ×3 IMPLANT
NEEDLE FILTER BLUNT 18X 1/2SAF (NEEDLE) ×2
NEEDLE FILTER BLUNT 18X1 1/2 (NEEDLE) ×1 IMPLANT
NS IRRIG 1000ML POUR BTL (IV SOLUTION) ×3 IMPLANT
PACK EXTREMITY ARMC (MISCELLANEOUS) ×3 IMPLANT
SLING ARM LRG DEEP (SOFTGOODS) ×3 IMPLANT
STAPLER SKIN PROX 35W (STAPLE) ×3 IMPLANT
STOCKINETTE M/LG 89821 (MISCELLANEOUS) ×3 IMPLANT
SUT PROLENE 4 0 PS 2 18 (SUTURE) ×3 IMPLANT
SUT VIC AB 2-0 SH 27 (SUTURE) ×6
SUT VIC AB 2-0 SH 27XBRD (SUTURE) ×2 IMPLANT
SYR 10ML LL (SYRINGE) ×3 IMPLANT

## 2017-12-22 NOTE — Progress Notes (Signed)
Pt arrived to room from PACU with sling to right shoulder. Pt would not lie down in bed. She wanted to walk outside to have a cigarette. This nurse and PACU nurse told the pt she had just got back from surgery, we could not let her walk outside. She was on 02 at 2 liters because her 02 saturation was in the 80's on room air. She was reminded that this is a smoke free facility. She insisted she sign herself out AMA. She was encouraged to stay, but refused. Dr Odis LusterBowers was notified of p t's decision.

## 2017-12-22 NOTE — Anesthesia Post-op Follow-up Note (Signed)
Anesthesia QCDR form completed.        

## 2017-12-22 NOTE — OR Nursing (Signed)
Patient report having a fistula in left arm after a blood clot.  Patient reports she has always used this arm for blood draws and bp's denies any complications including lymphedema.  Order received per Dr Randa NgoPiscitello to use left arm for IV

## 2017-12-22 NOTE — Discharge Instructions (Signed)
Wear sling for comfort. NO LIFTING MORE THAN 10 POUNDS FOR 6 WEEKS.    Keep the dressing dry.  You may remove bandage in 3 days.  You may place Band-Aids over top of the incisions.  May shower once dressing is removed in 3 days.  Remove sling carefully only for showers, leaving arm down by your side while in the shower.  +++ Make sure to take some pain medication this evening before you fall asleep, in preparation for the nerve block wearing off in the middle of the night.  If the the pain medication causes itching, or is too strong, try taking a single tablet at a time, or combining with Benadryl.  You may be most comfortable sleeping in a recliner.  If you do sleep in near bed, placed pillows behind the shoulder that have the operation to support it.      Interscalene Nerve Block with Exparel  1.  For your surgery you have received an Interscalene Nerve Block with Exparel. 2. Nerve Blocks affect many types of nerves, including nerves that control movement, pain and normal sensation.  You may experience feelings such as numbness, tingling, heaviness, weakness or the inability to move your arm or the feeling or sensation that your arm has "fallen asleep". 3. A nerve block with Exparel can last up to 5 days.  Usually the weakness wears off first.  The tingling and heaviness usually wear off next.  Finally you may start to notice pain.  Keep in mind that this may occur in any order.  Once a nerve block starts to wear off it is usually completely gone within 60 minutes. 4. ISNB may cause mild shortness of breath, a hoarse voice, blurry vision, unequal pupils, or drooping of the face on the same side as the nerve block.  These symptoms will usually resolve with the numbness.  Very rarely the procedure itself can cause mild seizures. 5. If needed, your surgeon will give you a prescription for pain medication.  It will take about 60 minutes for the oral pain medication to become fully effective.  So,  it is recommended that you start taking this medication before the nerve block first begins to wear off, or when you first begin to feel discomfort. 6. Take your pain medication only as prescribed.  Pain medication can cause sedation and decrease your breathing if you take more than you need for the level of pain that you have. 7. Nausea is a common side effect of many pain medications.  You may want to eat something before taking your pain medicine to prevent nausea. 8. After an Interscalene nerve block, you cannot feel pain, pressure or extremes in temperature in the effected arm.  Because your arm is numb it is at an increased risk for injury.  To decrease the possibility of injury, please practice the following:  a. While you are awake change the position of your arm frequently to prevent too much pressure on any one area for prolonged periods of time. b.  If you have a cast or tight dressing, check the color or your fingers every couple of hours.  Call your surgeon with the appearance of any discoloration (white or blue). c. If you are given a sling to wear before you go home, please wear it  at all times until the block has completely worn off.  Do not get up at night without your sling. d. Please contact [facility] or your surgeon if you do not  begin to regain sensation after 7 days from the surgery.  Anesthesia may be contacted by calling the Same Day Surgery Department, Mon. through Fri., 6 am to 4 pm at [phone].   e. If you experience any other problems or concerns, please contact your surgeon's office. f. If you experience severe or prolonged shortness of breath go to the nearest emergency department.

## 2017-12-22 NOTE — Anesthesia Preprocedure Evaluation (Signed)
Anesthesia Evaluation  Patient identified by MRN, date of birth, ID band Patient awake    Reviewed: Allergy & Precautions, H&P , NPO status , Patient's Chart, lab work & pertinent test results  History of Anesthesia Complications Negative for: history of anesthetic complications  Airway Mallampati: III  TM Distance: <3 FB Neck ROM: limited    Dental  (+) Chipped, Poor Dentition, Missing   Pulmonary neg shortness of breath, sleep apnea and Continuous Positive Airway Pressure Ventilation , Current Smoker,           Cardiovascular Exercise Tolerance: Good hypertension, negative cardio ROS       Neuro/Psych  Headaches,  Neuromuscular disease CVA, Residual Symptoms negative psych ROS   GI/Hepatic negative GI ROS, (+) Hepatitis -  Endo/Other  negative endocrine ROSdiabetes, Type 2  Renal/GU Renal disease     Musculoskeletal   Abdominal   Peds  Hematology negative hematology ROS (+)   Anesthesia Other Findings Past Medical History: No date: Diabetes mellitus without complication (HCC) No date: Diverticulitis No date: Headache No date: Hepatitis C     Comment:  treated and resolved No date: History of kidney stones No date: Hypertension No date: IBS (irritable bowel syndrome) No date: Kidney stones No date: Sciatica No date: Sleep apnea     Comment:  on cpap 2017: Stroke [facility](HCC)     Comment:  memory loss  Past Surgical History: No date: ABDOMINAL HYSTERECTOMY 2017: carpel tunnel syndrome 03/11/2017: HUMERUS IM NAIL; Right     Comment:  Procedure: INTRAMEDULLARY (IM) NAIL HUMERAL;  Surgeon:               Lyndle HerrlichBowers, James R, MD;  Location: [facility] ORS;  Service:               Orthopedics;  Laterality: Right; 02/12/12: KIDNEY STONE SURGERY; Right 07/15/2012: KIDNEY STONE SURGERY; Left 06/21/2016: LIGATION OF ARTERIOVENOUS  FISTULA; Left     Comment:  Procedure: LIGATION OF ARTERIOVENOUS  FISTULA ( LIGATION  BASILIC VEIN );  Surgeon: Renford DillsGregory G Schnier, MD;                Location: [facility] ORS;  Service: Vascular;  Laterality:               Left; No date: LITHOTRIPSY No date: OOPHORECTOMY No date: SINUS EXPLORATION No date: TONSILLECTOMY  BMI    Body Mass Index:  34.57 kg/m      Reproductive/Obstetrics negative OB ROS                             Anesthesia Physical Anesthesia Plan  ASA: III  Anesthesia Plan: General ETT   Post-op Pain Management: GA combined w/ Regional for post-op pain   Induction: Intravenous  PONV Risk Score and Plan: Dexamethasone, Midazolam and Ondansetron  Airway Management Planned: Oral ETT and Video Laryngoscope Planned  Additional Equipment:   Intra-op Plan:   Post-operative Plan: Extubation in OR  Informed Consent: I have reviewed the patients History and Physical, chart, labs and discussed the procedure including the risks, benefits and alternatives for the proposed anesthesia with the patient or authorized representative who has indicated his/her understanding and acceptance.   Dental Advisory Given  Plan Discussed with: Anesthesiologist, CRNA and Surgeon  Anesthesia Plan Comments: (Patient is 99% on RA today and she reports that she did well the her interscalene nerve block last time.  Patient consented for risks of anesthesia including but not limited  to:  - adverse reactions to medications - damage to teeth, lips or other oral mucosa - sore throat or hoarseness - Damage to heart, brain, lungs or loss of life  Patient voiced understanding.)        Anesthesia Quick Evaluation

## 2017-12-22 NOTE — Op Note (Signed)
12/22/2017  4:49 PM  PATIENT:  Stephanie [name]    PRE-OPERATIVE DIAGNOSIS:  S49.001G Unsp physl fx upr end humer, right arm, subs for fx w delay heal  POST-OPERATIVE DIAGNOSIS:  Same  PROCEDURE:  HARDWARE REMOVAL-RIGHT ARM  SURGEON:  Lyndle HerrlichJames R Amarri Satterly, MD  ANESTHESIA:   General Endotracheal and Regional Block  PREOPERATIVE INDICATIONS:  Stephanie [name] is a  52 y.o. female with a diagnosis of S49.001G Unsp physl fx upr end humer, right arm, subs for fx w delay heal who failed conservative measures and elected for surgical management.    The risks benefits and alternatives were discussed with the patient preoperatively including but not limited to the risks of infection, bleeding, nerve injury, cardiopulmonary complications, the need for revision surgery, among others, and the patient was willing to proceed.  EBL: 25 ml  TOURNIQUET TIME:  None used  OPERATIVE IMPLANTS: Synthes short humeral nail and 5 screws are removed.  OPERATIVE FINDINGS: Patient with crepitus and grind along the superior aspect of the shoulder with movement.       OPERATIVE PROCEDURE:   The patient was brought to the operating room and underwent satisfactory general endotracheal anesthesia in the supine position. The bed was repositioned to the beachchair position. The operative arm was prepped and draped in a sterile fashion. 1/2% Sensorcaine with epinephrine was placed in the proximal and distal incisional areas. A short saber incision was made off the edge of the acromion and dissection carried out bluntly through subcutaneous tissue. Muscle was split bluntly at the anterior edge of the acromium and bursectomy carried out. An incision was made in the supraspinatus cuff tissue and the proximal portion of the nail was identified. The prior stab incisions were utilized to remove the 2 distal and 3 proximal screws. The threaded rod was inserted proximally and using the back slap hammer the rod was removed.  The wounds were irrigated and the rotator cuff closed with 0 vicryl suture. Subcutaneous tissues were closed with 0 and 2-0 Vicryl. Remainder of the Sensorcaine was injected. Dry sterile dressings and a sling were applied. Sponge and needle counts were correct. Patient was awakened and taken to recovery in good condition.   Dola ArgyleJames R. Odis LusterBowers, MD

## 2017-12-22 NOTE — H&P (Signed)
The patient has been re-examined, and the chart reviewed, and there have been no interval changes to the documented history and physical.  Plan a right shoulder removal of humeral nail today.  Anesthesia is consulted regarding a peripheral nerve block for post-operative pain.  The risks, benefits, and alternatives have been discussed at length, and the patient is willing to proceed.

## 2017-12-22 NOTE — OR Nursing (Addendum)
Instructed on use of incentive spirometer. Patient has blood glucose monitor attached to upper left arm anesthesia aware and reports it can stay in place.

## 2017-12-22 NOTE — Anesthesia Procedure Notes (Signed)
Procedure Name: Intubation Date/Time: 12/22/2017 2:43 PM Performed by: Nelda Marseille, CRNA Pre-anesthesia Checklist: Patient identified, Patient being monitored, Timeout performed, Emergency Drugs available and Suction available Patient Re-evaluated:Patient Re-evaluated prior to induction Oxygen Delivery Method: Circle system utilized Preoxygenation: Pre-oxygenation with 100% oxygen Induction Type: IV induction Ventilation: Mask ventilation without difficulty Laryngoscope Size: Mac, 3 and McGraph Grade View: Grade IV Tube type: Oral Tube size: 7.0 mm Number of attempts: 1 Airway Equipment and Method: Stylet and Video-laryngoscopy Placement Confirmation: ETT inserted through vocal cords under direct vision,  positive ETCO2 and breath sounds checked- equal and bilateral Secured at: 21 cm Tube secured with: Tape Dental Injury: Teeth and Oropharynx as per pre-operative assessment  Difficulty Due To: Difficulty was anticipated, Difficult Airway-  due to neck instability, Difficult Airway- due to anterior larynx, Difficult Airway- due to limited oral opening and Difficult Airway- due to dentition

## 2017-12-22 NOTE — OR Nursing (Signed)
Dr. Odis LusterBowers listened to [facility]

## 2017-12-22 NOTE — Transfer of Care (Signed)
Immediate Anesthesia Transfer of Care Note  Patient: Stephanie [name]  Procedure(s) Performed: HARDWARE REMOVAL-RIGHT ARM (Right )  Patient Location: PACU  Anesthesia Type:General  Level of Consciousness: awake and sedated  Airway & Oxygen Therapy: Patient Spontanous Breathing and Patient connected to face mask oxygen  Post-op Assessment: Report given to RN and Post -op Vital signs reviewed and stable  Post vital signs: Reviewed and stable  Last Vitals:  Vitals Value Taken Time  BP    Temp    Pulse 71 12/22/2017  4:33 PM  Resp 13 12/22/2017  4:33 PM  SpO2 100 % 12/22/2017  4:33 PM  Vitals shown include unvalidated device data.  Last Pain:  Vitals:   12/22/17 1139  TempSrc: Temporal  PainSc: 0-No pain         Complications: No apparent anesthesia complications

## 2017-12-22 NOTE — Progress Notes (Signed)
CH paged to complete a HCPOA for patient before scheduled procedure. CH was unable to complete the HCPOA as it was previously filled out including witnesses. CH offered to provide a blank copy and a staff member went to locate one.There was not enough time as the procedure was going to start before a form could be located. CH will leave not with unit CH to provide follow-up for patient.

## 2017-12-22 NOTE — Anesthesia Procedure Notes (Signed)
Anesthesia Regional Block: Interscalene brachial plexus block   Pre-Anesthetic Checklist: ,, timeout performed, Correct Patient, Correct Site, Correct Laterality, Correct Procedure, Correct Position, site marked, Risks and benefits discussed,  Surgical consent,  Pre-op evaluation,  At surgeon's request and post-op pain management  Laterality: Upper and Right  Prep: chloraprep       Needles:  Injection technique: Single-shot  Needle Type: Stimiplex     Needle Length: 5cm  Needle Gauge: 22     Additional Needles:   Procedures:,,,, ultrasound used (permanent image in chart),,,,  Narrative:  Start time: 12/22/2017 12:34 PM End time: 12/22/2017 12:38 PM Injection made incrementally with aspirations every 5 mL.  Performed by: Personally  Anesthesiologist: Zamyra Allensworth, Cleda MccreedyJoseph K, MD  Additional Notes: Functioning IV was confirmed and monitors were applied.  A 50mm 22ga Stimuplex needle was used. Sterile prep,hand hygiene and sterile gloves were used.  Minimal sedation used for procedure.  No paresthesia endorsed by patient during the procedure.  Negative aspiration and negative test dose prior to incremental administration of local anesthetic. The patient tolerated the procedure well with no immediate complications.

## 2017-12-23 ENCOUNTER — Encounter: Payer: Self-pay | Admitting: Orthopedic Surgery

## 2017-12-24 NOTE — Anesthesia Postprocedure Evaluation (Signed)
Anesthesia Post Note  Patient: Stephanie [name]  Procedure(s) Performed: HARDWARE REMOVAL-RIGHT ARM (Right )  Patient location during evaluation: PACU Anesthesia Type: General Level of consciousness: awake and alert Pain management: pain level controlled Vital Signs Assessment: post-procedure vital signs reviewed and stable Respiratory status: spontaneous breathing, nonlabored ventilation, respiratory function stable and patient connected to nasal cannula oxygen Cardiovascular status: blood pressure returned to baseline and stable Postop Assessment: no apparent nausea or vomiting Anesthetic complications: no     Last Vitals:  Vitals:   12/22/17 2000 12/22/17 2015  BP: 115/66 140/73  Pulse: 68 82  Resp: 16 18  Temp:  (!) 36.3 C  SpO2: 94% 94%    Last Pain:  Vitals:   12/22/17 1945  TempSrc:   PainSc: Asleep                 [name]

## 2018-01-03 NOTE — H&P (Signed)
PREOPERATIVE H&P  Chief Complaint: S49.001G Unsp physl fx upr end humer, right arm, subs for fx w delay heal  HPI: Stephanie [name] is a 52 y.o. female who presents for preoperative history and physical with a diagnosis of S49.001G Unsp physl fx upr end humer, right arm, subs for fx w delay heal. Symptoms are rated as moderate to severe, and have been worsening.  This is significantly impairing activities of daily living.  She has elected for surgical management.   Past Medical History:  Diagnosis Date  . Diabetes mellitus without complication (HCC)   . Difficult intubation   . Diverticulitis   . Headache   . Hepatitis C    treated and resolved  . History of kidney stones   . Hypertension   . IBS (irritable bowel syndrome)   . Kidney stones   . Sciatica   . Sleep apnea    on cpap  . Stroke [facility]) 2017   memory loss   Past Surgical History:  Procedure Laterality Date  . ABDOMINAL HYSTERECTOMY    . carpel tunnel syndrome  2017  . HARDWARE REMOVAL Right 12/22/2017   Procedure: HARDWARE REMOVAL-RIGHT ARM;  Surgeon: Lyndle Herrlich, MD;  Location: [facility] ORS;  Service: Orthopedics;  Laterality: Right;  Removal of implant, superficial right humerus  . HUMERUS IM NAIL Right 03/11/2017   Procedure: INTRAMEDULLARY (IM) NAIL HUMERAL;  Surgeon: Lyndle Herrlich, MD;  Location: [facility] ORS;  Service: Orthopedics;  Laterality: Right;  . KIDNEY STONE SURGERY Right 02/12/12  . KIDNEY STONE SURGERY Left 07/15/2012  . LIGATION OF ARTERIOVENOUS  FISTULA Left 06/21/2016   Procedure: LIGATION OF ARTERIOVENOUS  FISTULA ( LIGATION BASILIC VEIN );  Surgeon: Renford Dills, MD;  Location: [facility] ORS;  Service: Vascular;  Laterality: Left;  . LITHOTRIPSY    . OOPHORECTOMY    . SINUS EXPLORATION    . TONSILLECTOMY     Social History   Socioeconomic History  . Marital status: Married    Spouse name: Not on file  . Number of children: Not on file  . Years of education: Not on file  . Highest education  level: Not on file  Occupational History  . Not on file  Social Needs  . Financial resource strain: Not on file  . Food insecurity:    Worry: Not on file    Inability: Not on file  . Transportation needs:    Medical: Not on file    Non-medical: Not on file  Tobacco Use  . Smoking status: Current Every Day Smoker    Packs/day: 0.25    Years: 1.50    Pack years: 0.37    Types: Cigarettes  . Smokeless tobacco: Never Used  Substance and Sexual Activity  . Alcohol use: No    Comment: no alcohol since 2002  . Drug use: No    Comment: electric cig  . Sexual activity: Not on file  Lifestyle  . Physical activity:    Days per week: Not on file    Minutes per session: Not on file  . Stress: Not on file  Relationships  . Social connections:    Talks on phone: Not on file    Gets together: Not on file    Attends religious service: Not on file    Active member of club or organization: Not on file    Attends meetings of clubs or organizations: Not on file    Relationship status: Not on file  Other Topics Concern  .  Not on file  Social History Narrative  . Not on file   Family History  Problem Relation Age of Onset  . Heart failure Mother   . Diabetes Father   . Cancer Sister    Allergies  Allergen Reactions  . Gabapentin Itching  . Tylenol [Acetaminophen] Itching  . Aspirin Itching  . Buprenorphine Hcl Itching  . Codeine Itching and Other (See Comments)    Makes her feel like something is crawling under her skin. Can take, sometimes makes her itch  . Compazine Itching and Other (See Comments)    "makes my skin crawl"  . Ioxaglate Itching  . Ivp Dye [Iodinated Diagnostic Agents] Itching and Other (See Comments)    Makes her itch really bad. Had to get 2-3 shots of Benadryl when she was in the hospital.  . Metrizamide Itching  . Naproxen Itching  . Norco [Hydrocodone-Acetaminophen] Itching  . Penicillin G Hives, Rash and Other (See Comments)    Has patient had a PCN  reaction causing immediate rash, facial/tongue/throat swelling, SOB or lightheadedness with hypotension: Yes Has patient had a PCN reaction causing severe rash involving mucus membranes or skin necrosis: No Has patient had a PCN reaction that required hospitalization: No Has patient had a PCN reaction occurring within the last 10 years: No If all of the above answers are "NO", then may proceed with Cephalosporin use.   Marland Kitchen Penicillins Itching and Other (See Comments)    Has patient had a PCN reaction causing immediate rash, facial/tongue/throat swelling, SOB or lightheadedness with hypotension: Yes Has patient had a PCN reaction causing severe rash involving mucus membranes or skin necrosis: No Has patient had a PCN reaction that required hospitalization: No Has patient had a PCN reaction occurring within the last 10 years: No If all of the above answers are "NO", then may proceed with Cephalosporin use.   Marland Kitchen Prochlorperazine Maleate Other (See Comments)    Compazine makes her skin crawl - needs 2-3 shots of Benadryl to get relief.  . Reglan [Metoclopramide] Itching and Other (See Comments)    Makes her skin crawl  . Sulfur Hives, Rash and Other (See Comments)    Skin Rashes, Hives  . Tegretol [Carbamazepine] Itching and Other (See Comments)    Makes her feel like something is crawling under her skin.  . Toradol [Ketorolac Tromethamine] Itching  . Tramadol Itching   Prior to Admission medications   Medication Sig Start Date End Date Taking? Authorizing Provider  alprazolam Prudy Feeler) 2 MG tablet Take 2 mg by mouth 2 (two) times daily.    Yes [provider]  amLODipine (NORVASC) 10 MG tablet Take 10 mg by mouth daily.   Yes [provider]  atorvastatin (LIPITOR) 40 MG tablet Take 40 mg by mouth every evening.   Yes [provider]  Cholecalciferol (VITAMIN D) 2000 units CAPS Take 2,000 Units by mouth daily.   Yes [provider]  citalopram (CELEXA) 20 MG  tablet Take 20 mg by mouth daily.   Yes [provider]  cloNIDine (CATAPRES) 0.1 MG tablet Take 0.1 mg by mouth daily.   Yes [provider]  clopidogrel (PLAVIX) 75 MG tablet Take 1 tablet (75 mg total) by mouth daily. 06/27/16  Yes Marvel Plan, MD  cyclobenzaprine (FLEXERIL) 10 MG tablet Take 10 mg by mouth 3 (three) times daily as needed for muscle spasms.   Yes [provider]  dicyclomine (BENTYL) 20 MG tablet Take 20 mg by mouth every 6 (  six) hours as needed for spasms.   Yes [provider]  diltiazem (DILACOR XR) 180 MG 24 hr capsule Take 180 mg by mouth daily.   Yes [provider]  Erenumab-aooe (AIMOVIG 140 DOSE) 70 MG/ML SOAJ Inject 140 mg into the skin every 28 (twenty-eight) days.   Yes [provider]  glipiZIDE (GLUCOTROL) 5 MG tablet Take 1 tablet (5 mg total) by mouth 2 (two) times daily before a meal. 04/30/16  Yes Lora Paula, MD  hyoscyamine (LEVSIN, ANASPAZ) 0.125 MG tablet Take 0.125 mg by mouth every 6 (six) hours as needed for bladder spasms or cramping.  08/08/17  Yes [provider]  loratadine (CLARITIN) 10 MG tablet Take 10 mg by mouth daily.   Yes [provider]  losartan (COZAAR) 100 MG tablet Take 100 mg by mouth daily.  08/08/17  Yes [provider]  omeprazole (PRILOSEC) 40 MG capsule Take 40 mg by mouth daily.   Yes [provider]  promethazine (PHENERGAN) 25 MG tablet Take 25 mg by mouth every 6 (six) hours as needed for nausea or vomiting.  08/08/17  Yes [provider]  propranolol (INDERAL) 60 MG tablet Take 60 mg by mouth daily.   Yes [provider]  zolpidem (AMBIEN) 10 MG tablet Take 10 mg by mouth at bedtime.  08/14/17  Yes [provider]  docusate sodium (COLACE) 100 MG capsule Take 1 capsule (100 mg total) by mouth daily as needed. 12/22/17 12/22/18  Lyndle Herrlich, MD  oxyCODONE (ROXICODONE) 5 MG immediate release tablet Take 1  tablet (5 mg total) by mouth every 8 (eight) hours as needed. 12/22/17 12/22/18  Lyndle Herrlich, MD     Positive ROS: All other systems have been reviewed and were otherwise negative with the exception of those mentioned in the HPI and as above.  Physical Exam: General: Alert, no acute distress Cardiovascular: Regular rate and rhythm, no murmurs rubs or gallops.  No pedal edema Respiratory: Clear to auscultation bilaterally, no wheezes rales or rhonchi. No cyanosis, no use of accessory musculature GI: No organomegaly, abdomen is soft and non-tender nondistended with positive bowel sounds. Skin: Skin intact, no lesions within the operative field. Neurologic: Sensation intact distally Psychiatric: Patient is competent for consent with normal mood and affect Lymphatic: No axillary or cervical lymphadenopathy  MUSCULOSKELETAL: incision well healed, crepitus with range of motion, right shoulder, positive Apley's  Assessment: S49.001G Unsp physl fx upr end humer, right arm, subs for fx w delay heal  Plan: Plan for Procedure(s): HARDWARE REMOVAL-RIGHT ARM  I discussed the risks and benefits of surgery. The risks include but are not limited to infection, bleeding requiring blood transfusion, nerve or blood vessel injury, joint stiffness or loss of motion, persistent pain, weakness or instability, malunion, nonunion and hardware failure and the need for further surgery. Medical risks include but are not limited to DVT and pulmonary embolism, myocardial infarction, stroke, pneumonia, respiratory failure and death. Patient understood these risks and wished to proceed.   Lyndle Herrlich, MD   01/03/2018 4:51 PM

## 2018-02-14 ENCOUNTER — Emergency Department (HOSPITAL_COMMUNITY): Payer: PRIVATE HEALTH INSURANCE

## 2018-02-14 ENCOUNTER — Emergency Department (HOSPITAL_COMMUNITY)
Admission: EM | Admit: 2018-02-14 | Discharge: 2018-02-14 | Disposition: A | Discharge status: Home / Self Care | Payer: PRIVATE HEALTH INSURANCE | Attending: Emergency Medicine | Admitting: Emergency Medicine

## 2018-02-14 ENCOUNTER — Other Ambulatory Visit: Payer: Self-pay

## 2018-02-14 ENCOUNTER — Encounter (HOSPITAL_COMMUNITY): Payer: Self-pay | Admitting: Emergency Medicine

## 2018-02-14 DIAGNOSIS — Z79899 Other long term (current) drug therapy: Secondary | ICD-10-CM | POA: Insufficient documentation

## 2018-02-14 DIAGNOSIS — R109 Unspecified abdominal pain: Secondary | ICD-10-CM | POA: Insufficient documentation

## 2018-02-14 DIAGNOSIS — R42 Dizziness and giddiness: Secondary | ICD-10-CM | POA: Insufficient documentation

## 2018-02-14 DIAGNOSIS — Z7902 Long term (current) use of antithrombotics/antiplatelets: Secondary | ICD-10-CM | POA: Insufficient documentation

## 2018-02-14 DIAGNOSIS — M5442 Lumbago with sciatica, left side: Secondary | ICD-10-CM | POA: Insufficient documentation

## 2018-02-14 DIAGNOSIS — N390 Urinary tract infection, site not specified: Secondary | ICD-10-CM | POA: Insufficient documentation

## 2018-02-14 DIAGNOSIS — W19XXXA Unspecified fall, initial encounter: Secondary | ICD-10-CM

## 2018-02-14 DIAGNOSIS — M5441 Lumbago with sciatica, right side: Secondary | ICD-10-CM | POA: Diagnosis not present

## 2018-02-14 DIAGNOSIS — Z7984 Long term (current) use of oral hypoglycemic drugs: Secondary | ICD-10-CM | POA: Insufficient documentation

## 2018-02-14 DIAGNOSIS — F17210 Nicotine dependence, cigarettes, uncomplicated: Secondary | ICD-10-CM | POA: Diagnosis not present

## 2018-02-14 DIAGNOSIS — M545 Low back pain: Secondary | ICD-10-CM | POA: Diagnosis present

## 2018-02-14 DIAGNOSIS — R197 Diarrhea, unspecified: Secondary | ICD-10-CM | POA: Insufficient documentation

## 2018-02-14 DIAGNOSIS — E119 Type 2 diabetes mellitus without complications: Secondary | ICD-10-CM | POA: Insufficient documentation

## 2018-02-14 DIAGNOSIS — R296 Repeated falls: Secondary | ICD-10-CM | POA: Diagnosis not present

## 2018-02-14 DIAGNOSIS — I10 Essential (primary) hypertension: Secondary | ICD-10-CM | POA: Insufficient documentation

## 2018-02-14 LAB — URINALYSIS, ROUTINE W REFLEX MICROSCOPIC
Bilirubin Urine: NEGATIVE
Glucose, UA: NEGATIVE mg/dL
Hgb urine dipstick: NEGATIVE
Ketones, ur: NEGATIVE mg/dL
NITRITE: NEGATIVE
PH: 7 (ref 5.0–8.0)
Protein, ur: NEGATIVE mg/dL
SPECIFIC GRAVITY, URINE: 1.004 — AB (ref 1.005–1.030)

## 2018-02-14 LAB — I-STAT CHEM 8, ED
BUN: 8 mg/dL (ref 6–20)
Calcium, Ion: 1.22 mmol/L (ref 1.15–1.40)
Chloride: 105 mmol/L (ref 101–111)
Creatinine, Ser: 0.6 mg/dL (ref 0.44–1.00)
Glucose, Bld: 89 mg/dL (ref 65–99)
HEMATOCRIT: 44 % (ref 36.0–46.0)
Hemoglobin: 15 g/dL (ref 12.0–15.0)
POTASSIUM: 4.1 mmol/L (ref 3.5–5.1)
Sodium: 142 mmol/L (ref 135–145)
TCO2: 26 mmol/L (ref 22–32)

## 2018-02-14 MED ORDER — FENTANYL CITRATE (PF) 100 MCG/2ML IJ SOLN
50.0000 ug | Freq: Once | INTRAMUSCULAR | Status: AC
Start: 2018-02-14 — End: 2018-02-14
  Administered 2018-02-14: 50 ug via INTRAVENOUS
  Filled 2018-02-14: qty 2

## 2018-02-14 MED ORDER — FENTANYL CITRATE (PF) 100 MCG/2ML IJ SOLN
25.0000 ug | Freq: Once | INTRAMUSCULAR | Status: AC
  Administered 2018-02-14: 25 ug via INTRAVENOUS
  Filled 2018-02-14: qty 2

## 2018-02-14 MED ORDER — ONDANSETRON HCL 4 MG PO TABS
4.0000 mg | ORAL_TABLET | Freq: Once | ORAL | Status: AC
  Administered 2018-02-14: 4 mg via ORAL
  Filled 2018-02-14: qty 1

## 2018-02-14 NOTE — ED Notes (Signed)
Patient transported to X-ray 

## 2018-02-14 NOTE — ED Triage Notes (Signed)
Pt reports that she was chasing a cat couple days ago and fell. Patient c/o mid to lower back pains. Pt has bruising on her back. Pt reports she was seen for migraine couple days ago and told MD about her back pain but did nothing for her.

## 2018-02-14 NOTE — ED Provider Notes (Addendum)
[facility]-EMERGENCY DEPT Provider Note   CSN: [id number] Arrival date & time: 02/14/18  1341     History   Chief Complaint Chief Complaint  Patient presents with  . Fall  . Back Pain    HPI Stephanie [name] is a 52 y.o. female.  HPI   Patient is a 52 year old female with a history of T2DM, kidney stones, hypertension, CVA (chronic right sided residual deficits), sciatica, IBS who presents the emergency department today to be evaluated for back pain that began after a fall.  Patient states that she has a h/o multiple falls as she has residual decreased balance since her prior CVA.  Has had several falls over the last week.  She states that she rolled out of bed 4 days ago.  She also states that several days ago she fell down 4 steps and landed on her bottom and her back 3 days ago. States she "grazed my head " against an air conditioner and "passed out for a few minutes".  After this injury she was seen at [facility] migraine.  At the time she had a CT scan which was negative for any acute intracranial abnormality or bleeding. Pt is not on blood thinners and denies any severe headaches or neurologic sxs.  Denies any lightheadedness, vision changes.  States she has chronic dizziness at baseline which is unchanged.  She states that she fell she has had lower back pain that radiates down her legs.  She has a history of sciatica and has had similar symptoms in the past. This feels like a typical flare up for her.  Denies any numbness or weakness to her legs. Has been ambulatory at home.  No loss of control of her bowels or bladder function. No saddle anesthesia. No h/o CA, fevers, IVDU.  Is also complaining of right flank pain that she states feels like a kidney stone.  States she has a history of known kidney stones bilaterally.  Has had some hematuria.  No dysuria, frequency, urgency.  No upper back pain.  No chest pain or shortness of breath.  States she has had  some nausea and vomiting at home as well as some diarrhea which is chronic for her due to her IBS.  Past Medical History:  Diagnosis Date  . Diabetes mellitus without complication (HCC)   . Difficult intubation   . Diverticulitis   . Headache   . Hepatitis C    treated and resolved  . History of kidney stones   . Hypertension   . IBS (irritable bowel syndrome)   . Kidney stones   . Sciatica   . Sleep apnea    on cpap  . Stroke [facility](HCC) 2017   memory loss    Patient Active Problem List   Diagnosis Date Noted  . Shoulder pain, right 12/22/2017  . UTI (urinary tract infection) 08/21/2017  . Nausea & vomiting 08/21/2017  . Nicotine dependence 08/21/2017  . Headache 01/07/2017  . Small bowel obstruction (HCC) 09/02/2016  . Pain and swelling of left upper extremity 07/22/2016  . Superficial venous thrombosis of arm, left 07/22/2016  . ICH (intracerebral hemorrhage) (HCC)   . Essential hypertension, malignant 04/19/2016  . Cytotoxic brain edema (HCC) 04/19/2016  . IVH (intraventricular hemorrhage) (HCC) 04/18/2016  . Hepatitis C     Past Surgical History:  Procedure Laterality Date  . ABDOMINAL HYSTERECTOMY    . carpel tunnel syndrome  2017  . HARDWARE REMOVAL Right 12/22/2017  Procedure: HARDWARE REMOVAL-RIGHT ARM;  Surgeon: Lyndle Herrlich, MD;  Location: [facility] ORS;  Service: Orthopedics;  Laterality: Right;  Removal of implant, superficial right humerus  . HUMERUS IM NAIL Right 03/11/2017   Procedure: INTRAMEDULLARY (IM) NAIL HUMERAL;  Surgeon: Lyndle Herrlich, MD;  Location: [facility] ORS;  Service: Orthopedics;  Laterality: Right;  . KIDNEY STONE SURGERY Right 02/12/12  . KIDNEY STONE SURGERY Left 07/15/2012  . LIGATION OF ARTERIOVENOUS  FISTULA Left 06/21/2016   Procedure: LIGATION OF ARTERIOVENOUS  FISTULA ( LIGATION BASILIC VEIN );  Surgeon: Renford Dills, MD;  Location: [facility] ORS;  Service: Vascular;  Laterality: Left;  . LITHOTRIPSY    . OOPHORECTOMY    . SINUS  EXPLORATION    . TONSILLECTOMY       OB History   None      Home Medications    Prior to Admission medications   Medication Sig Start Date End Date Taking? Authorizing Provider  alprazolam Prudy Feeler) 2 MG tablet Take 2 mg by mouth 2 (two) times daily.     [provider]  amLODipine (NORVASC) 10 MG tablet Take 10 mg by mouth daily.    [provider]  atorvastatin (LIPITOR) 40 MG tablet Take 40 mg by mouth every evening.    [provider]  Cholecalciferol (VITAMIN D) 2000 units CAPS Take 2,000 Units by mouth daily.    [provider]  citalopram (CELEXA) 20 MG tablet Take 20 mg by mouth daily.    [provider]  cloNIDine (CATAPRES) 0.1 MG tablet Take 0.1 mg by mouth daily.    [provider]  clopidogrel (PLAVIX) 75 MG tablet Take 1 tablet (75 mg total) by mouth daily. 06/27/16   Marvel Plan, MD  cyclobenzaprine (FLEXERIL) 10 MG tablet Take 10 mg by mouth 3 (three) times daily as needed for muscle spasms.    [provider]  dicyclomine (BENTYL) 20 MG tablet Take 20 mg by mouth every 6 (six) hours as needed for spasms.    [provider]  diltiazem (DILACOR XR) 180 MG 24 hr capsule Take 180 mg by mouth daily.    [provider]  docusate sodium (COLACE) 100 MG capsule Take 1 capsule (100 mg total) by mouth daily as needed. 12/22/17 12/22/18  Lyndle Herrlich, MD  Erenumab-aooe (AIMOVIG 140 DOSE) 70 MG/ML SOAJ Inject 140 mg into the skin every 28 (twenty-eight) days.    [provider]  glipiZIDE (GLUCOTROL) 5 MG tablet Take 1 tablet (5 mg total) by mouth 2 (two) times daily before a meal. 04/30/16   Lora Paula, MD  hyoscyamine (LEVSIN, ANASPAZ) 0.125 MG tablet Take 0.125 mg by mouth every 6 (six) hours as needed for bladder spasms or cramping.  08/08/17   [provider]  loratadine (CLARITIN) 10 MG tablet Take 10 mg by mouth daily.    [provider]  losartan (COZAAR) 100 MG  tablet Take 100 mg by mouth daily.  08/08/17   [provider]  omeprazole (PRILOSEC) 40 MG capsule Take 40 mg by mouth daily.    [provider]  oxyCODONE (ROXICODONE) 5 MG immediate release tablet Take 1 tablet (5 mg total) by mouth every 8 (eight) hours as needed. 12/22/17 12/22/18  Lyndle Herrlich, MD  promethazine (PHENERGAN) 25 MG tablet Take 25 mg by mouth every 6 (six) hours as needed for nausea or vomiting.  08/08/17   [provider]  propranolol (INDERAL) 60 MG tablet Take 60  mg by mouth daily.    [provider]  zolpidem (AMBIEN) 10 MG tablet Take 10 mg by mouth at bedtime.  08/14/17   [provider]    Family History Family History  Problem Relation Age of Onset  . Heart failure Mother   . Diabetes Father   . Cancer Sister     Social History Social History   Tobacco Use  . Smoking status: Current Every Day Smoker    Packs/day: 0.25    Years: 1.50    Pack years: 0.37    Types: Cigarettes  . Smokeless tobacco: Never Used  Substance Use Topics  . Alcohol use: No    Comment: no alcohol since 2002  . Drug use: No    Comment: electric cig     Allergies   Gabapentin; Tylenol [acetaminophen]; Aspirin; Buprenorphine hcl; Codeine; Compazine; Ioxaglate; Ivp dye [iodinated diagnostic agents]; Metrizamide; Naproxen; Norco [hydrocodone-acetaminophen]; Penicillin g; Penicillins; Prochlorperazine maleate; Reglan [metoclopramide]; Sulfur; Tegretol [carbamazepine]; Toradol [ketorolac tromethamine]; and Tramadol   Review of Systems Review of Systems  Constitutional: Negative for fever.  HENT: Negative for congestion and rhinorrhea.   Eyes: Negative for visual disturbance.  Respiratory: Negative for cough and shortness of breath.   Cardiovascular: Negative for chest pain.  Gastrointestinal: Positive for diarrhea, nausea and vomiting. Negative for abdominal pain and constipation.  Genitourinary: Positive for flank pain and hematuria.  Negative for dysuria, frequency, urgency, vaginal bleeding and vaginal discharge.  Musculoskeletal: Positive for back pain. Negative for neck pain.  Skin: Negative for wound.  Neurological: Positive for dizziness (chronic, unchanged today) and headaches (chronic, unchanged today). Negative for weakness, light-headedness and numbness.    Physical Exam Updated Vital Signs BP 129/83 (BP Location: Right Arm)   Pulse 62   Temp 97.7 F (36.5 C) (Oral)   Resp 16   Ht 5\' 2"  (1.575 m)   Wt 90.7 kg (200 lb)   SpO2 96%   BMI 36.58 kg/m   Physical Exam  Constitutional: She is oriented to person, place, and time. She appears well-developed and well-nourished. No distress.  HENT:  Head: Normocephalic and atraumatic.  Right Ear: External ear normal.  Left Ear: External ear normal.  Mouth/Throat: Oropharynx is clear and moist.  Eyes: Pupils are equal, round, and reactive to light. Conjunctivae are normal.  Neck: Neck supple.  Cardiovascular: Normal rate, regular rhythm, normal heart sounds and intact distal pulses.  No murmur heard. Pulmonary/Chest: Effort normal and breath sounds normal. No stridor. No respiratory distress. She has no wheezes.  Areas of old ecchymosis to bilateral scapula that are nontender to palpation.  No tenderness across the chest wall  Abdominal: Soft. Bowel sounds are normal. She exhibits no distension. There is no tenderness. There is no guarding.  Right CVA tenderness.  Musculoskeletal: She exhibits no edema.  Tenderness to palpation to the thoracic and lumbar spine.  No C-spine tenderness.  Neurological: She is alert and oriented to person, place, and time.  Mental Status:  Alert, thought content appropriate, able to give a coherent history. Speech fluent without evidence of aphasia. Able to follow 2 step commands without difficulty.  Cranial Nerves:  II:  Peripheral visual fields grossly normal, pupils equal, round, reactive to light III,IV, VI: ptosis not  present, extra-ocular motions intact bilaterally  V,VII: smile symmetric, facial light touch sensation equal VIII: hearing grossly normal to voice  X: uvula elevates symmetrically  XI: bilateral shoulder shrug symmetric and strong XII: midline tongue extension without fassiculations Motor:  Normal  tone. 5/5 strength of LUE and LLE major muscle groups including strong and equal grip strength and dorsiflexion/plantar flexion, 4/5 strength through RLE and RUE that is chronic and baseline s/p CVA (per pt) Sensory: light touch normal in all extremities. DTRs: biceps and achilles 2+ symmetric b/l Cerebellar: normal finger-to-nose with bilateral upper extremities Gait: normal gait and balance. Able to walk on toes and heels with ease.  CV: 2+ radial and DP/PT pulses   Skin: Skin is warm and dry. Capillary refill takes less than 2 seconds.  Psychiatric: She has a normal mood and affect.  Nursing note and vitals reviewed.    ED Treatments / Results  Labs (all labs ordered are listed, but only abnormal results are displayed) Labs Reviewed  URINALYSIS, ROUTINE W REFLEX MICROSCOPIC - Abnormal; Notable for the following components:      Result Value   Specific Gravity, Urine 1.004 (*)    Leukocytes, UA LARGE (*)    Bacteria, UA MANY (*)    All other components within normal limits  URINE CULTURE  I-STAT CHEM 8, ED    EKG None  Radiology Dg Chest 2 View  Result Date: 02/14/2018 CLINICAL DATA:  Status post fall a few days ago with back pain EXAM: CHEST - 2 VIEW COMPARISON:  December 22, 2017 FINDINGS: The heart size and mediastinal contours are stable. There is no focal infiltrate, pulmonary edema, or pleural effusion. Mild degenerative joint changes of the spine are noted. Chronic deformity of the proximal right humerus is unchanged. IMPRESSION: No active cardiopulmonary disease. Electronically Signed   By: Sherian Rein M.D.   On: 02/14/2018 16:13   Dg Thoracic Spine 2 View  Result Date:  02/14/2018 CLINICAL DATA:  Status post fall few days ago with back pain EXAM: THORACIC SPINE 2 VIEWS COMPARISON:  Chest x-ray April 11, 2017 FINDINGS: There is no evidence of thoracic spine fracture. Scoliosis of the spine is noted. Degenerative joint changes with anterior osteophytosis and narrowed joint space are identified. IMPRESSION: No acute fracture or dislocation. Osteoarthritic changes of thoracic spine. Electronically Signed   By: Sherian Rein M.D.   On: 02/14/2018 16:17   Dg Lumbar Spine Complete  Result Date: 02/14/2018 CLINICAL DATA:  Status post fall few days ago with back pain. EXAM: LUMBAR SPINE - COMPLETE 4+ VIEW COMPARISON:  None. FINDINGS: There is no evidence of lumbar spine fracture. Degenerative joint changes of the mid to lower lumbar spine are noted. There is scoliosis of spine. IMPRESSION: No acute fracture or dislocation. Electronically Signed   By: Sherian Rein M.D.   On: 02/14/2018 16:17   Dg Scapula Left  Result Date: 02/14/2018 CLINICAL DATA:  Status post fall a few days ago with back pain. EXAM: LEFT SCAPULA - 2+ VIEWS COMPARISON:  None. FINDINGS: There is no evidence of fracture or dislocation. Soft tissues are unremarkable. IMPRESSION: Negative. Electronically Signed   By: Sherian Rein M.D.   On: 02/14/2018 16:16   Dg Scapula Right  Result Date: 02/14/2018 CLINICAL DATA:  Status post fall a few days ago with back pain. EXAM: RIGHT SCAPULA - 2+ VIEWS COMPARISON:  Jan 18, 2017 FINDINGS: There is no evidence of fracture or dislocation. Chronic deformity of the proximal right humerus is noted. Degenerative joint changes of the right acromioclavicular joint are noted. Soft tissues are unremarkable. IMPRESSION: No acute abnormality. Electronically Signed   By: Sherian Rein M.D.   On: 02/14/2018 16:15   Ct Renal Stone Study  Result Date: 02/14/2018 CLINICAL  DATA:  Status post fall few days ago with back pain and bruise on her back. EXAM: CT ABDOMEN AND PELVIS WITHOUT CONTRAST  TECHNIQUE: Multidetector CT imaging of the abdomen and pelvis was performed following the standard protocol without IV contrast. COMPARISON:  February 11, 2018 FINDINGS: Lower chest: No acute abnormality. Hepatobiliary: No focal liver abnormality is seen. No gallstones, gallbladder wall thickening, or biliary dilatation. Pancreas: Unremarkable. No pancreatic ductal dilatation or surrounding inflammatory changes. Spleen: Normal in size without focal abnormality. Adrenals/Urinary Tract: The bilateral adrenal glands are normal. Scarring of bilateral kidneys are noted. There are nonobstructing stones in both kidneys. There is no hydronephrosis bilaterally. Bladder is normal. Stomach/Bowel: Stomach is within normal limits. Appendix appears normal. No evidence of bowel wall thickening, distention, or inflammatory changes. Vascular/Lymphatic: Aortic atherosclerosis. No enlarged abdominal or pelvic lymph nodes. Reproductive: Status post hysterectomy. No adnexal masses. Other: None. Musculoskeletal: Degenerative joint changes of the spine are noted. No acute fracture or dislocation is identified. IMPRESSION: No acute abnormalities identified in the abdomen and pelvis. Degenerative joint changes of the lumbar spine without evidence of acute fracture dislocation. The soft tissues of the back is unremarkable. Scarring of both kidneys with bilateral nonobstructing stones in the kidneys. Electronically Signed   By: Sherian Rein M.D.   On: 02/14/2018 16:24    Procedures Procedures (including critical care time)  Medications Ordered in ED Medications  fentaNYL (SUBLIMAZE) injection 25 mcg (25 mcg Intravenous Given 02/14/18 1516)  fentaNYL (SUBLIMAZE) injection 50 mcg (50 mcg Intravenous Given 02/14/18 1626)  ondansetron (ZOFRAN) tablet 4 mg (4 mg Oral Given 02/14/18 1728)     Initial Impression / Assessment and Plan / ED Course  I have reviewed the triage vital signs and the nursing notes.  Pertinent labs & imaging results  that were available during my care of the patient were reviewed by me and considered in my medical decision making (see chart for details).     Pt requesting pain medication multiple times throughout her stay in the ED. She asked which pain medication I gave her here and I told her fentanyl. She states that she usually receives morphine when she has a flare up of her chronic back pain and it resolves her pain for several months.   Chart reviewed in [place] narcotic database and patient has overdose risk score of 830.  Patient has received prescriptions from 16 different providers.  She receives alprazolam and zolpidem on a monthly basis from the same provider.  Final Clinical Impressions(s) / ED Diagnoses   Final diagnoses:  Acute midline low back pain with bilateral sciatica  Fall, initial encounter  Urinary tract infection without hematuria, site unspecified  Flank pain   Patient presenting complaining of lower back pain after a fall.  Upon further evaluation she has multiple other complaints as listed in the HPI.  Her vital signs are stable in the ED and she is afebrile.  Cardiopulmonary exam is benign.  Some bruising noted to the back.  Some midline lumbar tenderness noted.  Normal neurologic exam and ambulatory in the room.  Her lab work is benign. UA with leukocytes, no nitrites.  Culture sent.  Patient states that her prior visit on 02/11/2018 she was diagnosed with UTI and was sent home with antibiotics which she states she has been taking.  Advised her to continue taking these antibiotics until you they are finished. Patient has had no vomiting in the emergency department.  She is nontoxic-appearing and has been tolerating p.o.  Do not suspect pyelo in this patient.  Imaging was reviewed and chest x-ray negative bilateral scapular x-rays negative, thoracic spine negative for acute abnormality, x-ray lumbar spine negative, CT renal study without any acute abnormalities identified in the  abdomen and pelvis.   Degenerative changes noted in the lumbar spine without evidence of fracture or dislocation.  Bilateral nonobstructing stones noted in the kidneys.  No ureterolithiasis noted. Had shared decision decision making discussion with patient about not obtaining a repeat head CT at this time given that she has no neurologic symptoms and already had a head CT after her fall 3 days ago.  Patient in agreement to forego head CT at this time given that she has no neurologic symptoms that are concerning for bleeding. Discussed results with patient.  Patient again asks me what I am going to send her home with for pain.  I discussed that we will treat her pain with Tylenol.  She has many reported allergies.  advised patient to follow-up with her PCP for reevaluation and to return to the ER if she has any new or worsening symptoms.   ED Discharge Orders    None       Karrie Meres, PA-C 02/14/18 1803    Rayne Du 02/14/18 1806    Tilden Fossa, MD 02/15/18 [phone]

## 2018-02-14 NOTE — ED Notes (Signed)
Pt requesting nausea medicine--PA Courtni informed

## 2018-02-14 NOTE — ED Notes (Signed)
Bed: WTR9 Expected date:  Expected time:  Means of arrival:  Comments: 

## 2018-02-14 NOTE — Discharge Instructions (Addendum)
Continue taking the antibiotic that you were given on your prior visit for UTI.  You may take Tylenol for your symptoms and use warm or cool compresses to help with your pain.  Please follow up with your primary doctor within the next 7-10 days for re-evaluation and further treatment of your symptoms.   Please return to the ER sooner if you have any new or worsening symptoms.

## 2018-02-16 LAB — URINE CULTURE: CULTURE: NO GROWTH

## 2018-03-03 ENCOUNTER — Other Ambulatory Visit: Payer: Self-pay

## 2018-03-03 ENCOUNTER — Emergency Department (HOSPITAL_COMMUNITY): Payer: PRIVATE HEALTH INSURANCE

## 2018-03-03 ENCOUNTER — Encounter (HOSPITAL_COMMUNITY): Payer: Self-pay

## 2018-03-03 ENCOUNTER — Emergency Department (HOSPITAL_COMMUNITY)
Admission: EM | Admit: 2018-03-03 | Discharge: 2018-03-03 | Disposition: A | Discharge status: Home / Self Care | Payer: PRIVATE HEALTH INSURANCE | Attending: Emergency Medicine | Admitting: Emergency Medicine

## 2018-03-03 DIAGNOSIS — Y929 Unspecified place or not applicable: Secondary | ICD-10-CM | POA: Insufficient documentation

## 2018-03-03 DIAGNOSIS — Y939 Activity, unspecified: Secondary | ICD-10-CM | POA: Insufficient documentation

## 2018-03-03 DIAGNOSIS — Y999 Unspecified external cause status: Secondary | ICD-10-CM | POA: Diagnosis not present

## 2018-03-03 DIAGNOSIS — M542 Cervicalgia: Secondary | ICD-10-CM | POA: Insufficient documentation

## 2018-03-03 DIAGNOSIS — F17210 Nicotine dependence, cigarettes, uncomplicated: Secondary | ICD-10-CM | POA: Insufficient documentation

## 2018-03-03 DIAGNOSIS — E119 Type 2 diabetes mellitus without complications: Secondary | ICD-10-CM | POA: Insufficient documentation

## 2018-03-03 DIAGNOSIS — I10 Essential (primary) hypertension: Secondary | ICD-10-CM | POA: Diagnosis not present

## 2018-03-03 DIAGNOSIS — Z8673 Personal history of transient ischemic attack (TIA), and cerebral infarction without residual deficits: Secondary | ICD-10-CM | POA: Diagnosis not present

## 2018-03-03 DIAGNOSIS — Z79899 Other long term (current) drug therapy: Secondary | ICD-10-CM | POA: Insufficient documentation

## 2018-03-03 DIAGNOSIS — R51 Headache: Secondary | ICD-10-CM | POA: Diagnosis not present

## 2018-03-03 DIAGNOSIS — T07XXXA Unspecified multiple injuries, initial encounter: Secondary | ICD-10-CM | POA: Diagnosis not present

## 2018-03-03 DIAGNOSIS — M79631 Pain in right forearm: Secondary | ICD-10-CM | POA: Diagnosis not present

## 2018-03-03 DIAGNOSIS — Z7902 Long term (current) use of antithrombotics/antiplatelets: Secondary | ICD-10-CM | POA: Diagnosis not present

## 2018-03-03 DIAGNOSIS — W109XXA Fall (on) (from) unspecified stairs and steps, initial encounter: Secondary | ICD-10-CM | POA: Diagnosis not present

## 2018-03-03 DIAGNOSIS — W19XXXA Unspecified fall, initial encounter: Secondary | ICD-10-CM

## 2018-03-03 DIAGNOSIS — R52 Pain, unspecified: Secondary | ICD-10-CM

## 2018-03-03 DIAGNOSIS — M545 Low back pain: Secondary | ICD-10-CM | POA: Diagnosis not present

## 2018-03-03 MED ORDER — METHOCARBAMOL 500 MG PO TABS: 500.0000 mg | ORAL_TABLET | Freq: Two times a day (BID) | ORAL | 0 refills | Status: DC | PRN

## 2018-03-03 MED ORDER — HYDROCODONE-ACETAMINOPHEN 5-325 MG PO TABS: 1.0000 | ORAL_TABLET | ORAL | 0 refills | Status: DC | PRN

## 2018-03-03 MED ORDER — HYDROMORPHONE HCL 1 MG/ML IJ SOLN
1.0000 mg | Freq: Once | INTRAMUSCULAR | Status: AC
  Administered 2018-03-03: 1 mg via INTRAMUSCULAR
  Filled 2018-03-03: qty 1

## 2018-03-03 MED ORDER — DIAZEPAM 5 MG PO TABS
5.0000 mg | ORAL_TABLET | Freq: Once | ORAL | Status: AC
  Administered 2018-03-03: 5 mg via ORAL
  Filled 2018-03-03: qty 1

## 2018-03-03 NOTE — ED Triage Notes (Signed)
Pt states that she fell down two steps today hitting her head, denies LOC, also reports that she  Had bleeding from her ear X2 weeks ago, has apt with ENT upcoming. Negative Stroke screen today. Pt reports tingling in left leg but states that this is chronic r/t diabetes.

## 2018-03-03 NOTE — ED Notes (Signed)
Patient verbalizes understanding of discharge instructions. Opportunity for questioning and answers were provided. Armband removed by staff, pt discharged from ED ambulatory with husband.  

## 2018-03-03 NOTE — ED Provider Notes (Signed)
Patient placed in Quick Look pathway, seen and evaluated   Chief Complaint: Fall  HPI:   Patient reports that around 2pm she fell down two steps hitting her head head on the ground.  She denies any new weakness.  She reports inability to move her head/neck secondary to pain.    ROS: No LOC (one)  Physical Exam:   Gen: No distress  Neuro: Awake and Alert  Skin: Warm    Focused Exam: No facial droop, strength symmetrical in bilateral upper and lower extremities.     Initiation of care has begun. The patient has been counseled on the process, plan, and necessity for staying for the completion/evaluation, and the remainder of the medical screening examination    Norman ClayHammond, Yasin Ducat W, PA-C 03/03/18 1827    Terrilee FilesButler, Michael C, MD 03/04/18 1125

## 2018-03-03 NOTE — ED Notes (Signed)
Patient ambulatory to bathroom with steady gait at this time 

## 2018-03-07 ENCOUNTER — Encounter (HOSPITAL_COMMUNITY): Payer: Self-pay | Admitting: *Deleted

## 2018-03-07 ENCOUNTER — Other Ambulatory Visit: Payer: Self-pay

## 2018-03-07 ENCOUNTER — Emergency Department (HOSPITAL_COMMUNITY)
Admission: EM | Admit: 2018-03-07 | Discharge: 2018-03-07 | Disposition: A | Discharge status: Home / Self Care | Payer: PRIVATE HEALTH INSURANCE | Attending: Emergency Medicine | Admitting: Emergency Medicine

## 2018-03-07 DIAGNOSIS — F17210 Nicotine dependence, cigarettes, uncomplicated: Secondary | ICD-10-CM | POA: Insufficient documentation

## 2018-03-07 DIAGNOSIS — Z8673 Personal history of transient ischemic attack (TIA), and cerebral infarction without residual deficits: Secondary | ICD-10-CM | POA: Diagnosis not present

## 2018-03-07 DIAGNOSIS — E119 Type 2 diabetes mellitus without complications: Secondary | ICD-10-CM | POA: Insufficient documentation

## 2018-03-07 DIAGNOSIS — I10 Essential (primary) hypertension: Secondary | ICD-10-CM | POA: Insufficient documentation

## 2018-03-07 DIAGNOSIS — N390 Urinary tract infection, site not specified: Secondary | ICD-10-CM | POA: Insufficient documentation

## 2018-03-07 DIAGNOSIS — Z7901 Long term (current) use of anticoagulants: Secondary | ICD-10-CM | POA: Insufficient documentation

## 2018-03-07 DIAGNOSIS — R112 Nausea with vomiting, unspecified: Secondary | ICD-10-CM

## 2018-03-07 DIAGNOSIS — Z79899 Other long term (current) drug therapy: Secondary | ICD-10-CM | POA: Diagnosis not present

## 2018-03-07 DIAGNOSIS — R1030 Lower abdominal pain, unspecified: Secondary | ICD-10-CM | POA: Diagnosis present

## 2018-03-07 DIAGNOSIS — R197 Diarrhea, unspecified: Secondary | ICD-10-CM

## 2018-03-07 LAB — URINALYSIS, ROUTINE W REFLEX MICROSCOPIC
BILIRUBIN URINE: NEGATIVE
GLUCOSE, UA: NEGATIVE mg/dL
KETONES UR: NEGATIVE mg/dL
Nitrite: NEGATIVE
PH: 5 (ref 5.0–8.0)
PROTEIN: NEGATIVE mg/dL
Specific Gravity, Urine: 1.018 (ref 1.005–1.030)
WBC, UA: >50 WBC/hpf — ABNORMAL HIGH (ref 0–5)

## 2018-03-07 LAB — COMPREHENSIVE METABOLIC PANEL
ALBUMIN: 3.8 g/dL (ref 3.5–5.0)
ALT: 18 U/L (ref 0–44)
AST: 18 U/L (ref 15–41)
Alkaline Phosphatase: 107 U/L (ref 38–126)
Anion gap: 6 (ref 5–15)
BUN: 8 mg/dL (ref 6–20)
CHLORIDE: 108 mmol/L (ref 98–111)
CO2: 26 mmol/L (ref 22–32)
CREATININE: 0.61 mg/dL (ref 0.44–1.00)
Calcium: 9.1 mg/dL (ref 8.9–10.3)
GFR calc Af Amer: >60 mL/min (ref >60)
GFR calc non Af Amer: >60 mL/min (ref >60)
Glucose, Bld: 118 mg/dL — ABNORMAL HIGH (ref 70–99)
POTASSIUM: 4.3 mmol/L (ref 3.5–5.1)
SODIUM: 140 mmol/L (ref 135–145)
Total Bilirubin: 0.3 mg/dL (ref 0.3–1.2)
Total Protein: 6.9 g/dL (ref 6.5–8.1)

## 2018-03-07 LAB — CBC
HCT: 44.7 % (ref 36.0–46.0)
Hemoglobin: 14.6 g/dL (ref 12.0–15.0)
MCH: 30.4 pg (ref 26.0–34.0)
MCHC: 32.7 g/dL (ref 30.0–36.0)
MCV: 93.1 fL (ref 78.0–100.0)
PLATELETS: 181 10*3/uL (ref 150–400)
RBC: 4.8 MIL/uL (ref 3.87–5.11)
RDW: 13.3 % (ref 11.5–15.5)
WBC: 7.6 10*3/uL (ref 4.0–10.5)

## 2018-03-07 LAB — I-STAT CHEM 8, ED
BUN: 48 mg/dL — AB (ref 6–20)
CALCIUM ION: 0.91 mmol/L — AB (ref 1.15–1.40)
CHLORIDE: 102 mmol/L (ref 98–111)
Creatinine, Ser: 3.3 mg/dL — ABNORMAL HIGH (ref 0.44–1.00)
GLUCOSE: 117 mg/dL — AB (ref 70–99)
HCT: 35 % — ABNORMAL LOW (ref 36.0–46.0)
Hemoglobin: 11.9 g/dL — ABNORMAL LOW (ref 12.0–15.0)
Potassium: 3.4 mmol/L — ABNORMAL LOW (ref 3.5–5.1)
Sodium: 133 mmol/L — ABNORMAL LOW (ref 135–145)
TCO2: 15 mmol/L — ABNORMAL LOW (ref 22–32)

## 2018-03-07 LAB — I-STAT BETA HCG BLOOD, ED (MC, WL, AP ONLY): I-stat hCG, quantitative: 9.2 m[IU]/mL — ABNORMAL HIGH (ref <5)

## 2018-03-07 LAB — LIPASE, BLOOD: LIPASE: 28 U/L (ref 11–51)

## 2018-03-07 MED ORDER — CEPHALEXIN 500 MG PO CAPS
1000.0000 mg | ORAL_CAPSULE | Freq: Once | ORAL | Status: AC
  Administered 2018-03-07: 1000 mg via ORAL
  Filled 2018-03-07: qty 2

## 2018-03-07 MED ORDER — ONDANSETRON 4 MG PO TBDP: 4.0000 mg | ORAL_TABLET | ORAL | 0 refills | Status: DC | PRN

## 2018-03-07 MED ORDER — HYDROMORPHONE HCL 1 MG/ML IJ SOLN
1.0000 mg | Freq: Once | INTRAMUSCULAR | Status: AC
  Administered 2018-03-07: 1 mg via INTRAMUSCULAR
  Filled 2018-03-07: qty 1

## 2018-03-07 MED ORDER — CEPHALEXIN 500 MG PO CAPS: 1000.0000 mg | ORAL_CAPSULE | Freq: Two times a day (BID) | ORAL | 0 refills | Status: DC

## 2018-03-07 NOTE — ED Notes (Signed)
Urine culture sent down with UA. 

## 2018-03-07 NOTE — ED Provider Notes (Signed)
[facility]-EMERGENCY DEPT Provider Note   CSN: [id number] Arrival date & time: 03/07/18  1820     History   Chief Complaint Chief Complaint  Patient presents with  . Abdominal Pain  . Headache  . Hematuria    HPI Stephanie [name] is a 52 y.o. female.  HPI Starting yesterday lower abdominal pain bilaterally.  Aching in quality.  Also lower back pain.  Patient reports 3 episodes of nonbloody diarrhea today.  Nausea with a couple episodes of vomiting.  No fever.  Patient has had a total abdominal hysterectomy.  She reports history of bilateral kidney stones.  No sick contact Past Medical History:  Diagnosis Date  . Diabetes mellitus without complication (HCC)   . Difficult intubation   . Diverticulitis   . Headache   . Hepatitis C    treated and resolved  . History of kidney stones   . Hypertension   . IBS (irritable bowel syndrome)   . Kidney stones   . Sciatica   . Sleep apnea    on cpap  . Stroke [facility]) 2017   memory loss    Patient Active Problem List   Diagnosis Date Noted  . Shoulder pain, right 12/22/2017  . UTI (urinary tract infection) 08/21/2017  . Nausea & vomiting 08/21/2017  . Nicotine dependence 08/21/2017  . Headache 01/07/2017  . Small bowel obstruction (HCC) 09/02/2016  . Pain and swelling of left upper extremity 07/22/2016  . Superficial venous thrombosis of arm, left 07/22/2016  . ICH (intracerebral hemorrhage) (HCC)   . Essential hypertension, malignant 04/19/2016  . Cytotoxic brain edema (HCC) 04/19/2016  . IVH (intraventricular hemorrhage) (HCC) 04/18/2016  . Hepatitis C     Past Surgical History:  Procedure Laterality Date  . ABDOMINAL HYSTERECTOMY    . carpel tunnel syndrome  2017  . HARDWARE REMOVAL Right 12/22/2017   Procedure: HARDWARE REMOVAL-RIGHT ARM;  Surgeon: Lyndle Herrlich, MD;  Location: [facility] ORS;  Service: Orthopedics;  Laterality: Right;  Removal of implant, superficial right humerus  . HUMERUS IM  NAIL Right 03/11/2017   Procedure: INTRAMEDULLARY (IM) NAIL HUMERAL;  Surgeon: Lyndle Herrlich, MD;  Location: [facility] ORS;  Service: Orthopedics;  Laterality: Right;  . KIDNEY STONE SURGERY Right 02/12/12  . KIDNEY STONE SURGERY Left 07/15/2012  . LIGATION OF ARTERIOVENOUS  FISTULA Left 06/21/2016   Procedure: LIGATION OF ARTERIOVENOUS  FISTULA ( LIGATION BASILIC VEIN );  Surgeon: Renford Dills, MD;  Location: [facility] ORS;  Service: Vascular;  Laterality: Left;  . LITHOTRIPSY    . OOPHORECTOMY    . SINUS EXPLORATION    . TONSILLECTOMY       OB History   None      Home Medications    Prior to Admission medications   Medication Sig Start Date End Date Taking? Authorizing Provider  alprazolam Prudy Feeler) 2 MG tablet Take 2 mg by mouth 2 (two) times daily.    Yes [provider]  amLODipine (NORVASC) 10 MG tablet Take 10 mg by mouth daily.   Yes [provider]  atorvastatin (LIPITOR) 40 MG tablet Take 40 mg by mouth every evening.   Yes [provider]  Cholecalciferol (VITAMIN D) 2000 units CAPS Take 2,000 Units by mouth daily.   Yes [provider]  cloNIDine (CATAPRES) 0.1 MG tablet Take 0.1 mg by mouth daily.   Yes [provider]  clopidogrel (PLAVIX) 75 MG tablet Take 1 tablet (75 mg total) by mouth daily. 06/27/16  Yes Marvel Plan, MD  diltiazem (DILACOR XR) 180 MG 24 hr capsule Take 180 mg by mouth daily.   Yes [provider]  glipiZIDE (GLUCOTROL) 5 MG tablet Take 1 tablet (5 mg total) by mouth 2 (two) times daily before a meal. 04/30/16  Yes Lora Paula, MD  HYDROcodone-acetaminophen (NORCO/VICODIN) 5-325 MG tablet Take 1 tablet by mouth every 4 (four) hours as needed. Patient taking differently: Take 1 tablet by mouth every 4 (four) hours as needed for severe pain.  03/03/18  Yes Raeford Razor, MD  levETIRAcetam (KEPPRA) 500 MG tablet Take 500 mg by mouth 2 (two) times daily. 02/16/18  Yes [provider]  losartan  (COZAAR) 100 MG tablet Take 100 mg by mouth daily.  08/08/17  Yes [provider]  methocarbamol (ROBAXIN) 500 MG tablet Take 1 tablet (500 mg total) by mouth 2 (two) times daily as needed for muscle spasms. 03/03/18  Yes Raeford Razor, MD  omeprazole (PRILOSEC) 40 MG capsule Take 40 mg by mouth daily.   Yes [provider]  propranolol (INDERAL) 60 MG tablet Take 60 mg by mouth daily.   Yes [provider]  zolpidem (AMBIEN) 10 MG tablet Take 10 mg by mouth at bedtime.  08/14/17  Yes [provider]  cephALEXin (KEFLEX) 500 MG capsule Take 2 capsules (1,000 mg total) by mouth 2 (two) times daily. 03/07/18   Arby Barrette, MD  cyclobenzaprine (FLEXERIL) 10 MG tablet Take 10 mg by mouth 3 (three) times daily as needed for muscle spasms.    [provider]  docusate sodium (COLACE) 100 MG capsule Take 1 capsule (100 mg total) by mouth daily as needed. Patient not taking: Reported on 03/07/2018 12/22/17 12/22/18  Lyndle Herrlich, MD  ondansetron (ZOFRAN ODT) 4 MG disintegrating tablet Take 1 tablet (4 mg total) by mouth every 4 (four) hours as needed for nausea or vomiting. 03/07/18   Arby Barrette, MD  oxyCODONE (ROXICODONE) 5 MG immediate release tablet Take 1 tablet (5 mg total) by mouth every 8 (eight) hours as needed. Patient not taking: Reported on 03/07/2018 12/22/17 12/22/18  Lyndle Herrlich, MD  promethazine (PHENERGAN) 25 MG tablet Take 25 mg by mouth every 6 (six) hours as needed for nausea or vomiting.  08/08/17   [provider]    Family History Family History  Problem Relation Age of Onset  . Heart failure Mother   . Diabetes Father   . Cancer Sister     Social History Social History   Tobacco Use  . Smoking status: Current Every Day Smoker    Packs/day: 0.25    Years: 1.50    Pack years: 0.37    Types: Cigarettes  . Smokeless tobacco: Never Used  Substance Use Topics  . Alcohol use: No    Comment: no alcohol since 2002    . Drug use: No    Comment: electric cig     Allergies   Gabapentin; Tylenol [acetaminophen]; Aspirin; Buprenorphine hcl; Codeine; Compazine; Ioxaglate; Ivp dye [iodinated diagnostic agents]; Metrizamide; Naproxen; Norco [hydrocodone-acetaminophen]; Penicillin g; Penicillins; Prochlorperazine maleate; Reglan [metoclopramide]; Sulfur; Tegretol [carbamazepine]; Toradol [ketorolac tromethamine]; and Tramadol   Review of Systems Review of Systems 10 Systems reviewed and are negative for acute change except as noted in the HPI.  Physical Exam Updated Vital Signs BP 119/70 (BP Location: Left Arm)   Pulse (!) 57   Temp 98 F (36.7 C) (Oral)   Resp 17   Ht 5\' 3"  (1.6 m)  Wt 89.8 kg (198 lb)   SpO2 98%   BMI 35.07 kg/m   Physical Exam  Constitutional: She is oriented to person, place, and time. She appears well-developed and well-nourished. No distress.  HENT:  Head: Normocephalic and atraumatic.  Eyes: EOM are normal.  Neck: Neck supple.  Cardiovascular: Normal rate, regular rhythm, normal heart sounds and intact distal pulses.  Pulmonary/Chest: Effort normal and breath sounds normal.  Abdominal: Soft. Bowel sounds are normal. She exhibits no distension. There is tenderness.  Mild diffuse lower abdominal tenderness without guarding.  Left slightly greater than right.  Mild CVA tenderness bilaterally left slightly greater than right.  Musculoskeletal: Normal range of motion. She exhibits no edema or tenderness.  Neurological: She is alert and oriented to person, place, and time. She has normal strength. She exhibits normal muscle tone. Coordination normal. GCS eye subscore is 4. GCS verbal subscore is 5. GCS motor subscore is 6.  Skin: Skin is warm, dry and intact.  Psychiatric: She has a normal mood and affect.     ED Treatments / Results  Labs (all labs ordered are listed, but only abnormal results are displayed) Labs Reviewed  COMPREHENSIVE METABOLIC PANEL - Abnormal;  Notable for the following components:      Result Value   Glucose, Bld 118 (*)    All other components within normal limits  URINALYSIS, ROUTINE W REFLEX MICROSCOPIC - Abnormal; Notable for the following components:   APPearance CLOUDY (*)    Hgb urine dipstick SMALL (*)    Leukocytes, UA LARGE (*)    WBC, UA >50 (*)    Bacteria, UA RARE (*)    Non Squamous Epithelial 21-50 (*)    All other components within normal limits  I-STAT BETA HCG BLOOD, ED ([facility], [facility], [facility] ONLY) - Abnormal; Notable for the following components:   I-stat hCG, quantitative 9.2 (*)    All other components within normal limits  I-STAT CHEM 8, ED - Abnormal; Notable for the following components:   Sodium 133 (*)    Potassium 3.4 (*)    BUN 48 (*)    Creatinine, Ser 3.30 (*)    Glucose, Bld 117 (*)    Calcium, Ion 0.91 (*)    TCO2 15 (*)    Hemoglobin 11.9 (*)    HCT 35.0 (*)    All other components within normal limits  URINE CULTURE  LIPASE, BLOOD  CBC    EKG None  Radiology No results found.  Procedures Procedures (including critical care time)  Medications Ordered in ED Medications  cephALEXin (KEFLEX) capsule 1,000 mg (1,000 mg Oral Given 03/07/18 2134)  HYDROmorphone (DILAUDID) injection 1 mg (1 mg Intramuscular Given 03/07/18 2132)     Initial Impression / Assessment and Plan / ED Course  I have reviewed the triage vital signs and the nursing notes.  Pertinent labs & imaging results that were available during my care of the patient were reviewed by me and considered in my medical decision making (see chart for details).      Final Clinical Impressions(s) / ED Diagnoses   Final diagnoses:  Lower urinary tract infectious disease  Nausea vomiting and diarrhea   Patient describes fairly typical gastroenteritis type symptoms with vomiting and diarrhea.  Abdominal examination is nonsurgical in nature.  Patient has no leukocytosis.  Renal function is normal.  Patient has known history of renal  kidney stones.  She has CT on 6\8\2019 that showed nonobstructing stones.  Pain is not typical for  obstructing kidney stones with diffuse, achy pain bilateral lower abdomen and suprapubic.  At this time, patient wishes to take outpatient medication for UTI and call her urologist on Monday.  She is counseled on necessity to return immediately should she develop a fever, worsening localizing pain, nausea or vomiting that prevents her from taking her antibiotics, or other concerning symptoms. ED Discharge Orders        Ordered    cephALEXin (KEFLEX) 500 MG capsule  2 times daily     03/07/18 2121    ondansetron (ZOFRAN ODT) 4 MG disintegrating tablet  Every 4 hours PRN     03/07/18 2121       Arby Barrette, MD 03/07/18 2141

## 2018-03-07 NOTE — ED Triage Notes (Signed)
Pt states she has had vomiting and diarrhea since last night but come in due to headache and when she went to the bathroom she saw blood in her panties ans urine.

## 2018-03-07 NOTE — Discharge Instructions (Signed)
1.  Call your urologist Monday morning to schedule a recheck as soon as possible. 2.  Return to the emergency department if you develop a fever, worsening pain, inability to tolerate your medications, ongoing vomiting or other concerns.

## 2018-03-07 NOTE — ED Notes (Signed)
Pt reports having left flank pain with radiation to abdomen along with nausea and vomiting. Pt reports having hx of kidney stones.

## 2018-03-09 LAB — URINE CULTURE: Culture: 30000 — AB

## 2018-03-09 NOTE — ED Provider Notes (Signed)
[facility] EMERGENCY DEPARTMENT Provider Note   CSN: [id number] Arrival date & time: 03/03/18  1802     History   Chief Complaint Chief Complaint  Patient presents with  . Fall    HPI Stephanie [name] is a 52 y.o. female.  HPI   52 year old female presenting after fall.  Around 2 PM she lost her balance and fell down 2 steps.  She did strike her head on the ground.  No loss of consciousness.  No acute numbness, tingling focal loss of strength.  She is having pain in her neck which is worse with movement.  Denies any similar pain elsewhere.  Past Medical History:  Diagnosis Date  . Diabetes mellitus without complication (HCC)   . Difficult intubation   . Diverticulitis   . Headache   . Hepatitis C    treated and resolved  . History of kidney stones   . Hypertension   . IBS (irritable bowel syndrome)   . Kidney stones   . Sciatica   . Sleep apnea    on cpap  . Stroke [facility]) 2017   memory loss    Patient Active Problem List   Diagnosis Date Noted  . Shoulder pain, right 12/22/2017  . UTI (urinary tract infection) 08/21/2017  . Nausea & vomiting 08/21/2017  . Nicotine dependence 08/21/2017  . Headache 01/07/2017  . Small bowel obstruction (HCC) 09/02/2016  . Pain and swelling of left upper extremity 07/22/2016  . Superficial venous thrombosis of arm, left 07/22/2016  . ICH (intracerebral hemorrhage) (HCC)   . Essential hypertension, malignant 04/19/2016  . Cytotoxic brain edema (HCC) 04/19/2016  . IVH (intraventricular hemorrhage) (HCC) 04/18/2016  . Hepatitis C     Past Surgical History:  Procedure Laterality Date  . ABDOMINAL HYSTERECTOMY    . carpel tunnel syndrome  2017  . HARDWARE REMOVAL Right 12/22/2017   Procedure: HARDWARE REMOVAL-RIGHT ARM;  Surgeon: Lyndle Herrlich, MD;  Location: [facility] ORS;  Service: Orthopedics;  Laterality: Right;  Removal of implant, superficial right humerus  . HUMERUS IM NAIL Right 03/11/2017   Procedure:  INTRAMEDULLARY (IM) NAIL HUMERAL;  Surgeon: Lyndle Herrlich, MD;  Location: [facility] ORS;  Service: Orthopedics;  Laterality: Right;  . KIDNEY STONE SURGERY Right 02/12/12  . KIDNEY STONE SURGERY Left 07/15/2012  . LIGATION OF ARTERIOVENOUS  FISTULA Left 06/21/2016   Procedure: LIGATION OF ARTERIOVENOUS  FISTULA ( LIGATION BASILIC VEIN );  Surgeon: Renford Dills, MD;  Location: [facility] ORS;  Service: Vascular;  Laterality: Left;  . LITHOTRIPSY    . OOPHORECTOMY    . SINUS EXPLORATION    . TONSILLECTOMY       OB History   None      Home Medications    Prior to Admission medications   Medication Sig Start Date End Date Taking? Authorizing Provider  alprazolam Prudy Feeler) 2 MG tablet Take 2 mg by mouth 2 (two) times daily.     [provider]  amLODipine (NORVASC) 10 MG tablet Take 10 mg by mouth daily.    [provider]  atorvastatin (LIPITOR) 40 MG tablet Take 40 mg by mouth every evening.    [provider]  cephALEXin (KEFLEX) 500 MG capsule Take 2 capsules (1,000 mg total) by mouth 2 (two) times daily. 03/07/18   Arby Barrette, MD  Cholecalciferol (VITAMIN D) 2000 units CAPS Take 2,000 Units by mouth daily.    [provider]  cloNIDine (CATAPRES) 0.1 MG tablet Take 0.1  mg by mouth daily.    [provider]  clopidogrel (PLAVIX) 75 MG tablet Take 1 tablet (75 mg total) by mouth daily. 06/27/16   Marvel Plan, MD  cyclobenzaprine (FLEXERIL) 10 MG tablet Take 10 mg by mouth 3 (three) times daily as needed for muscle spasms.    [provider]  diltiazem (DILACOR XR) 180 MG 24 hr capsule Take 180 mg by mouth daily.    [provider]  docusate sodium (COLACE) 100 MG capsule Take 1 capsule (100 mg total) by mouth daily as needed. Patient not taking: Reported on 03/07/2018 12/22/17 12/22/18  Lyndle Herrlich, MD  glipiZIDE (GLUCOTROL) 5 MG tablet Take 1 tablet (5 mg total) by mouth 2 (two) times daily before a meal. 04/30/16   Lora Paula, MD  HYDROcodone-acetaminophen (NORCO/VICODIN) 5-325 MG tablet Take 1 tablet by mouth every 4 (four) hours as needed. Patient taking differently: Take 1 tablet by mouth every 4 (four) hours as needed for severe pain.  03/03/18   Raeford Razor, MD  levETIRAcetam (KEPPRA) 500 MG tablet Take 500 mg by mouth 2 (two) times daily. 02/16/18   [provider]  losartan (COZAAR) 100 MG tablet Take 100 mg by mouth daily.  08/08/17   [provider]  methocarbamol (ROBAXIN) 500 MG tablet Take 1 tablet (500 mg total) by mouth 2 (two) times daily as needed for muscle spasms. 03/03/18   Raeford Razor, MD  omeprazole (PRILOSEC) 40 MG capsule Take 40 mg by mouth daily.    [provider]  ondansetron (ZOFRAN ODT) 4 MG disintegrating tablet Take 1 tablet (4 mg total) by mouth every 4 (four) hours as needed for nausea or vomiting. 03/07/18   Arby Barrette, MD  oxyCODONE (ROXICODONE) 5 MG immediate release tablet Take 1 tablet (5 mg total) by mouth every 8 (eight) hours as needed. Patient not taking: Reported on 03/07/2018 12/22/17 12/22/18  Lyndle Herrlich, MD  promethazine (PHENERGAN) 25 MG tablet Take 25 mg by mouth every 6 (six) hours as needed for nausea or vomiting.  08/08/17   [provider]  propranolol (INDERAL) 60 MG tablet Take 60 mg by mouth daily.    [provider]  zolpidem (AMBIEN) 10 MG tablet Take 10 mg by mouth at bedtime.  08/14/17   [provider]    Family History Family History  Problem Relation Age of Onset  . Heart failure Mother   . Diabetes Father   . Cancer Sister     Social History Social History   Tobacco Use  . Smoking status: Current Every Day Smoker    Packs/day: 0.25    Years: 1.50    Pack years: 0.37    Types: Cigarettes  . Smokeless tobacco: Never Used  Substance Use Topics  . Alcohol use: No    Comment: no alcohol since 2002  . Drug use: No    Comment: electric cig     Allergies   Gabapentin;  Tylenol [acetaminophen]; Aspirin; Buprenorphine hcl; Codeine; Compazine; Ioxaglate; Ivp dye [iodinated diagnostic agents]; Metrizamide; Naproxen; Norco [hydrocodone-acetaminophen]; Penicillin g; Penicillins; Prochlorperazine maleate; Reglan [metoclopramide]; Sulfur; Tegretol [carbamazepine]; Toradol [ketorolac tromethamine]; and Tramadol   Review of Systems Review of Systems All systems reviewed and negative, other than as noted in HPI.   Physical Exam Updated Vital Signs BP (!) 135/99   Pulse 83   Temp 97.9 F (36.6 C) (Oral)   Resp 18   Ht 5\' 3"  (1.6 m)   Wt 89.4 kg (  197 lb)   SpO2 99%   BMI 34.90 kg/m   Physical Exam  Constitutional: She is oriented to person, place, and time. She appears well-developed and well-nourished. No distress.  HENT:  Head: Normocephalic and atraumatic.  Eyes: Conjunctivae are normal. Right eye exhibits no discharge. Left eye exhibits no discharge.  Neck: Neck supple.  Cardiovascular: Normal rate, regular rhythm and normal heart sounds. Exam reveals no gallop and no friction rub.  No murmur heard. Pulmonary/Chest: Effort normal and breath sounds normal. No respiratory distress.  Abdominal: Soft. She exhibits no distension. There is no tenderness.  Musculoskeletal: She exhibits no edema.  Tenderness in the posterior neck into the right lateral neck.  No step-off or deformity.  No midline spinal tenderness elsewhere.  Range of motion of the large joints without apparent discomfort.  Neurological: She is alert and oriented to person, place, and time. No cranial nerve deficit or sensory deficit. She exhibits normal muscle tone. Coordination normal.  Skin: Skin is warm and dry.  Psychiatric: She has a normal mood and affect. Her behavior is normal. Thought content normal.  Nursing note and vitals reviewed.    ED Treatments / Results  Labs (all labs ordered are listed, but only abnormal results are displayed) Labs Reviewed - No data to  display  EKG None  Radiology No results found.   Dg Chest 2 View  Result Date: 02/14/2018 CLINICAL DATA:  Status post fall a few days ago with back pain EXAM: CHEST - 2 VIEW COMPARISON:  December 22, 2017 FINDINGS: The heart size and mediastinal contours are stable. There is no focal infiltrate, pulmonary edema, or pleural effusion. Mild degenerative joint changes of the spine are noted. Chronic deformity of the proximal right humerus is unchanged. IMPRESSION: No active cardiopulmonary disease. Electronically Signed   By: Sherian Rein M.D.   On: 02/14/2018 16:13   Dg Thoracic Spine 2 View  Result Date: 02/14/2018 CLINICAL DATA:  Status post fall few days ago with back pain EXAM: THORACIC SPINE 2 VIEWS COMPARISON:  Chest x-ray April 11, 2017 FINDINGS: There is no evidence of thoracic spine fracture. Scoliosis of the spine is noted. Degenerative joint changes with anterior osteophytosis and narrowed joint space are identified. IMPRESSION: No acute fracture or dislocation. Osteoarthritic changes of thoracic spine. Electronically Signed   By: Sherian Rein M.D.   On: 02/14/2018 16:17   Dg Thoracic Spine W/swimmers  Result Date: 03/03/2018 CLINICAL DATA:  Pain after fall today. EXAM: THORACIC SPINE - 3 VIEWS COMPARISON:  None. FINDINGS: There is no evidence of thoracic spine fracture. There are 12 ribbed thoracic vertebrae. Mild multilevel disc flattening is noted with small anterior osteophytes compatible mild degenerative change of the thoracic spine. No acute fracture, malalignment nor bone destruction. IMPRESSION: Mild degenerative change of the thoracic spine without acute osseous appearing abnormality. Electronically Signed   By: Tollie Eth M.D.   On: 03/03/2018 19:29   Dg Lumbar Spine Complete  Result Date: 03/03/2018 CLINICAL DATA:  Patient fell down 2 stairs at home today.  Pain. EXAM: LUMBAR SPINE - COMPLETE 4+ VIEW COMPARISON:  None. FINDINGS: There are 5 non ribbed lumbar vertebrae in  normal lumbar lordosis. No acute lumbar spine fracture, pars defects or listhesis. Moderate disc space narrowing is identified at L5-S1. Probable old limbus vertebra of L5 along the anterior superior aspect. Facet sclerosis from L3 through S1 compatible with degenerative facet arthropathy. 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 No acute fracture or  listhesis. Electronically Signed   By: Tollie Ethavid  Kwon M.D.   On: 03/03/2018 19:27   Dg Lumbar Spine Complete  Result Date: 02/14/2018 CLINICAL DATA:  Status post fall few days ago with back pain. EXAM: LUMBAR SPINE - COMPLETE 4+ VIEW COMPARISON:  None. FINDINGS: There is no evidence of lumbar spine fracture. Degenerative joint changes of the mid to lower lumbar spine are noted. There is scoliosis of spine. IMPRESSION: No acute fracture or dislocation. Electronically Signed   By: Sherian ReinWei-Chen  Lin M.D.   On: 02/14/2018 16:17   Dg Scapula Left  Result Date: 02/14/2018 CLINICAL DATA:  Status post fall a few days ago with back pain. EXAM: LEFT SCAPULA - 2+ VIEWS COMPARISON:  None. FINDINGS: There is no evidence of fracture or dislocation. Soft tissues are unremarkable. IMPRESSION: Negative. Electronically Signed   By: Sherian ReinWei-Chen  Lin M.D.   On: 02/14/2018 16:16   Dg Scapula Right  Result Date: 02/14/2018 CLINICAL DATA:  Status post fall a few days ago with back pain. EXAM: RIGHT SCAPULA - 2+ VIEWS COMPARISON:  Jan 18, 2017 FINDINGS: There is no evidence of fracture or dislocation. Chronic deformity of the proximal right humerus is noted. Degenerative joint changes of the right acromioclavicular joint are noted. Soft tissues are unremarkable. IMPRESSION: No acute abnormality. Electronically Signed   By: Sherian ReinWei-Chen  Lin M.D.   On: 02/14/2018 16:15   Dg Forearm Right  Result Date: 03/03/2018 CLINICAL DATA:  Forearm pain after fall today. EXAM: RIGHT FOREARM - 2 VIEW COMPARISON:  None. FINDINGS: There is no evidence of fracture or  other focal bone lesions. Mild soft tissue swelling along the dorsum of the mid forearm. IMPRESSION: Mild dorsal soft tissue swelling of the forearm. No acute fracture of the radius nor ulna. Intact joint spaces. Electronically Signed   By: Tollie Ethavid  Kwon M.D.   On: 03/03/2018 19:30   Ct Head Wo Contrast  Result Date: 03/03/2018 CLINICAL DATA:  52 year old female with acute headache and RIGHT neck pain following fall. Initial encounter. EXAM: CT HEAD WITHOUT CONTRAST CT CERVICAL SPINE WITHOUT CONTRAST TECHNIQUE: Multidetector CT imaging of the head and cervical spine was performed following the standard protocol without intravenous contrast. Multiplanar CT image reconstructions of the cervical spine were also generated. COMPARISON:  02/11/2018 and prior CTs FINDINGS: CT HEAD FINDINGS Brain: No evidence of acute infarction, hemorrhage, hydrocephalus, extra-axial collection or mass lesion/mass effect. 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 Vascular: Mild atherosclerotic calcifications again noted. Skull: Normal. Negative for fracture or focal lesion. Sinuses/Orbits: No acute finding. Other: None. CT CERVICAL SPINE FINDINGS Alignment: Normal. Skull base and vertebrae: No acute fracture. No primary bone lesion or focal pathologic process. Soft tissues and spinal canal: No prevertebral fluid or swelling. No visible canal hematoma. Disc levels: Mild degenerative disc disease and spondylosis from C5-T1 noted. Upper chest: No acute abnormality. Other: None IMPRESSION: 1. No evidence of acute intracranial abnormality. Remote LEFT occipital infarct. 2. No static evidence of acute injury to the cervical spine. Mild multilevel degenerative changes again noted. Electronically Signed   By: Harmon PierJeffrey  Hu M.D.   On: 03/03/2018 20:14   Ct Cervical Spine Wo Contrast  Result Date: 03/03/2018 CLINICAL DATA:  52 year old female with acute headache and RIGHT neck pain following fall. Initial encounter. EXAM: CT HEAD WITHOUT  CONTRAST CT CERVICAL SPINE WITHOUT CONTRAST TECHNIQUE: Multidetector CT imaging of the head and cervical spine was performed following the standard protocol without intravenous contrast. Multiplanar CT image reconstructions of the cervical spine were  also generated. COMPARISON:  02/11/2018 and prior CTs FINDINGS: CT HEAD FINDINGS Brain: No evidence of acute infarction, hemorrhage, hydrocephalus, extra-axial collection or mass lesion/mass effect. 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 Vascular: Mild atherosclerotic calcifications again noted. Skull: Normal. Negative for fracture or focal lesion. Sinuses/Orbits: No acute finding. Other: None. CT CERVICAL SPINE FINDINGS Alignment: Normal. Skull base and vertebrae: No acute fracture. No primary bone lesion or focal pathologic process. Soft tissues and spinal canal: No prevertebral fluid or swelling. No visible canal hematoma. Disc levels: Mild degenerative disc disease and spondylosis from C5-T1 noted. Upper chest: No acute abnormality. Other: None IMPRESSION: 1. No evidence of acute intracranial abnormality. Remote LEFT occipital infarct. 2. No static evidence of acute injury to the cervical spine. Mild multilevel degenerative changes again noted. Electronically Signed   By: Harmon Pier M.D.   On: 03/03/2018 20:14   Ct Renal Stone Study  Result Date: 02/14/2018 CLINICAL DATA:  Status post fall few days ago with back pain and bruise on her back. EXAM: CT ABDOMEN AND PELVIS WITHOUT CONTRAST TECHNIQUE: Multidetector CT imaging of the abdomen and pelvis was performed following the standard protocol without IV contrast. COMPARISON:  February 11, 2018 FINDINGS: Lower chest: No acute abnormality. Hepatobiliary: No focal liver abnormality is seen. No gallstones, gallbladder wall thickening, or biliary dilatation. Pancreas: Unremarkable. No pancreatic ductal dilatation or surrounding inflammatory changes. Spleen: Normal in size without focal abnormality. Adrenals/Urinary  Tract: The bilateral adrenal glands are normal. Scarring of bilateral kidneys are noted. There are nonobstructing stones in both kidneys. There is no hydronephrosis bilaterally. Bladder is normal. Stomach/Bowel: Stomach is within normal limits. Appendix appears normal. No evidence of bowel wall thickening, distention, or inflammatory changes. Vascular/Lymphatic: Aortic atherosclerosis. No enlarged abdominal or pelvic lymph nodes. Reproductive: Status post hysterectomy. No adnexal masses. Other: None. Musculoskeletal: Degenerative joint changes of the spine are noted. No acute fracture or dislocation is identified. IMPRESSION: No acute abnormalities identified in the abdomen and pelvis. Degenerative joint changes of the lumbar spine without evidence of acute fracture dislocation. The soft tissues of the back is unremarkable. Scarring of both kidneys with bilateral nonobstructing stones in the kidneys. Electronically Signed   By: Sherian Rein M.D.   On: 02/14/2018 16:24    Procedures Procedures (including critical care time)  Medications Ordered in ED Medications  HYDROmorphone (DILAUDID) injection 1 mg (1 mg Intramuscular Given 03/03/18 2104)  diazepam (VALIUM) tablet 5 mg (5 mg Oral Given 03/03/18 2104)     Initial Impression / Assessment and Plan / ED Course  I have reviewed the triage vital signs and the nursing notes.  Pertinent labs & imaging results that were available during my care of the patient were reviewed by me and considered in my medical decision making (see chart for details).     52 year old female with pain after a fall.  No acute neurological findings.  Imaging negative for acute traumatic abnormality.  Plan symptomatic treatment of her pain.  Return precautions were discussed.  Final Clinical Impressions(s) / ED Diagnoses   Final diagnoses:  Fall, initial encounter  Multiple contusions    ED Discharge Orders        Ordered    methocarbamol (ROBAXIN) 500 MG tablet  2  times daily PRN     03/03/18 2115    HYDROcodone-acetaminophen (NORCO/VICODIN) 5-325 MG tablet  Every 4 hours PRN     03/03/18 2115       Raeford Razor, MD 03/09/18 1734

## 2018-03-10 ENCOUNTER — Telehealth: Payer: Self-pay | Admitting: *Deleted

## 2018-03-10 DIAGNOSIS — N200 Calculus of kidney: Secondary | ICD-10-CM | POA: Insufficient documentation

## 2018-03-10 NOTE — Telephone Encounter (Signed)
Post ED Visit - Positive Culture Follow-up  Culture report reviewed by antimicrobial stewardship pharmacist:  []  Stephanie [name], Pharm.D. []  Celedonio MiyamotoJeremy Frens, Pharm.D., BCPS AQ-ID []  Garvin FilaMike Maccia, Pharm.D., BCPS []  Georgina PillionElizabeth Martin, Pharm.D., BCPS []  FarragutMinh Pham, 17[address] Rainbow BoulevardPharm.D., BCPS, AAHIVP []  Estella HuskMichelle Turner, Pharm.D., BCPS, AAHIVP [x]  Lysle Pearlachel Rumbarger, PharmD, BCPS []  Klemp Climeshuy Dang, PharmD, BCPS []  Agapito GamesAlison Masters, PharmD, BCPS []  Verlan FriendsErin Deja, PharmD  Positive urine culture Contaminent and no further patient follow-up is required at this time.  Virl AxeRobertson, Neviah Braud [facility] 03/10/2018, 9:42 AM

## 2018-05-01 ENCOUNTER — Emergency Department (HOSPITAL_COMMUNITY): Payer: No Typology Code available for payment source

## 2018-05-01 ENCOUNTER — Emergency Department (HOSPITAL_COMMUNITY)
Admission: EM | Admit: 2018-05-01 | Discharge: 2018-05-02 | Disposition: A | Discharge status: Home / Self Care | Payer: No Typology Code available for payment source | Attending: Emergency Medicine | Admitting: Emergency Medicine

## 2018-05-01 ENCOUNTER — Encounter (HOSPITAL_COMMUNITY): Payer: Self-pay | Admitting: *Deleted

## 2018-05-01 DIAGNOSIS — R51 Headache: Secondary | ICD-10-CM | POA: Insufficient documentation

## 2018-05-01 DIAGNOSIS — Z79899 Other long term (current) drug therapy: Secondary | ICD-10-CM | POA: Insufficient documentation

## 2018-05-01 DIAGNOSIS — R0781 Pleurodynia: Secondary | ICD-10-CM

## 2018-05-01 DIAGNOSIS — R0789 Other chest pain: Secondary | ICD-10-CM

## 2018-05-01 DIAGNOSIS — E119 Type 2 diabetes mellitus without complications: Secondary | ICD-10-CM | POA: Insufficient documentation

## 2018-05-01 DIAGNOSIS — Z7901 Long term (current) use of anticoagulants: Secondary | ICD-10-CM | POA: Diagnosis not present

## 2018-05-01 DIAGNOSIS — R202 Paresthesia of skin: Secondary | ICD-10-CM | POA: Diagnosis not present

## 2018-05-01 DIAGNOSIS — Z8673 Personal history of transient ischemic attack (TIA), and cerebral infarction without residual deficits: Secondary | ICD-10-CM | POA: Insufficient documentation

## 2018-05-01 DIAGNOSIS — R1031 Right lower quadrant pain: Secondary | ICD-10-CM | POA: Diagnosis not present

## 2018-05-01 DIAGNOSIS — F17210 Nicotine dependence, cigarettes, uncomplicated: Secondary | ICD-10-CM | POA: Insufficient documentation

## 2018-05-01 DIAGNOSIS — R519 Headache, unspecified: Secondary | ICD-10-CM

## 2018-05-01 DIAGNOSIS — I10 Essential (primary) hypertension: Secondary | ICD-10-CM | POA: Insufficient documentation

## 2018-05-01 LAB — COMPREHENSIVE METABOLIC PANEL
ALT: 23 U/L (ref 0–44)
AST: 24 U/L (ref 15–41)
Albumin: 4.1 g/dL (ref 3.5–5.0)
Alkaline Phosphatase: 107 U/L (ref 38–126)
Anion gap: 11 (ref 5–15)
BUN: 8 mg/dL (ref 6–20)
CO2: 24 mmol/L (ref 22–32)
Calcium: 9.6 mg/dL (ref 8.9–10.3)
Chloride: 110 mmol/L (ref 98–111)
Creatinine, Ser: 0.7 mg/dL (ref 0.44–1.00)
GFR calc Af Amer: >60 mL/min (ref >60)
GFR calc non Af Amer: >60 mL/min (ref >60)
Glucose, Bld: 108 mg/dL — ABNORMAL HIGH (ref 70–99)
Potassium: 4.2 mmol/L (ref 3.5–5.1)
Sodium: 145 mmol/L (ref 135–145)
Total Bilirubin: 1 mg/dL (ref 0.3–1.2)
Total Protein: 7.2 g/dL (ref 6.5–8.1)

## 2018-05-01 LAB — CBC
HCT: 43.7 % (ref 36.0–46.0)
HEMOGLOBIN: 14.6 g/dL (ref 12.0–15.0)
MCH: 31.2 pg (ref 26.0–34.0)
MCHC: 33.4 g/dL (ref 30.0–36.0)
MCV: 93.4 fL (ref 78.0–100.0)
Platelets: 207 10*3/uL (ref 150–400)
RBC: 4.68 MIL/uL (ref 3.87–5.11)
RDW: 13.5 % (ref 11.5–15.5)
WBC: 6.7 10*3/uL (ref 4.0–10.5)

## 2018-05-01 LAB — CBG MONITORING, ED: Glucose-Capillary: 106 mg/dL — ABNORMAL HIGH (ref 70–99)

## 2018-05-01 LAB — LIPASE, BLOOD: Lipase: 27 U/L (ref 11–51)

## 2018-05-01 MED ORDER — ONDANSETRON HCL 4 MG/2ML IJ SOLN: 4.0000 mg | Freq: Once | INTRAMUSCULAR | Status: DC

## 2018-05-01 MED ORDER — HYDROMORPHONE HCL 1 MG/ML IJ SOLN
1.0000 mg | Freq: Once | INTRAMUSCULAR | Status: AC
  Administered 2018-05-01: 1 mg via INTRAVENOUS
  Filled 2018-05-01: qty 1

## 2018-05-01 MED ORDER — PROMETHAZINE HCL 25 MG/ML IJ SOLN
12.5000 mg | Freq: Once | INTRAMUSCULAR | Status: AC
  Administered 2018-05-01: 12.5 mg via INTRAVENOUS
  Filled 2018-05-01: qty 1

## 2018-05-01 MED ORDER — SODIUM CHLORIDE 0.9 % IV BOLUS
1000.0000 mL | Freq: Once | INTRAVENOUS | Status: AC
  Administered 2018-05-01: 1000 mL via INTRAVENOUS

## 2018-05-01 NOTE — ED Notes (Signed)
Pt unable to provide urine sample at this time 

## 2018-05-01 NOTE — ED Provider Notes (Signed)
[facility]-EMERGENCY DEPT Provider Note   CSN: [id number] Arrival date & time: 05/01/18  1530     History   Chief Complaint Chief Complaint  Patient presents with  . Migraine  . Hyperglycemia  . Tingling    HPI Stephanie [name] is a 52 y.o. female with history of diabetes, IBS, migraines who presents with a 3-day history of headache.  She reports her headache feels typical of her normal.  She took Excedrin Migraine at home without relief.  She has had associated photophobia, phonophobia, nausea, vomiting.  She also has some right-sided neck pain which she states she typically has with her migraines.  Her symptoms began gradually.  She also reports she fell today when the dog was caught under her feet.  She reports hitting her right ribs on the step and a pile of bricks.  She did not hit her head or lose consciousness.  She reports that she has had tingling to her lower legs below her knees bilaterally since.  She also reports her blood sugar has been over 400 today.  Patient reports she has chronic abdominal pain and has no abdominal pain worse than normal.  She denies any saddle anesthesia, loss of bowel or bladder control.  She denies any chest pain, shortness of breath, vision changes.  Patient reports that she has several allergies and only has relief from Dilaudid for her headaches.  This is corroborated in the medical record.  Patient states she feels like she may have a UTI she is having some suprapubic pressure.  HPI  Past Medical History:  Diagnosis Date  . Diabetes mellitus without complication (HCC)   . Difficult intubation   . Diverticulitis   . Headache   . Hepatitis C    treated and resolved  . History of kidney stones   . Hypertension   . IBS (irritable bowel syndrome)   . Kidney stones   . Sciatica   . Sleep apnea    on cpap  . Stroke [facility]) 2017   memory loss    Patient Active Problem List   Diagnosis Date Noted  . Shoulder pain, right  12/22/2017  . UTI (urinary tract infection) 08/21/2017  . Nausea & vomiting 08/21/2017  . Nicotine dependence 08/21/2017  . Headache 01/07/2017  . Small bowel obstruction (HCC) 09/02/2016  . Pain and swelling of left upper extremity 07/22/2016  . Superficial venous thrombosis of arm, left 07/22/2016  . ICH (intracerebral hemorrhage) (HCC)   . Essential hypertension, malignant 04/19/2016  . Cytotoxic brain edema (HCC) 04/19/2016  . IVH (intraventricular hemorrhage) (HCC) 04/18/2016  . Hepatitis C     Past Surgical History:  Procedure Laterality Date  . ABDOMINAL HYSTERECTOMY    . carpel tunnel syndrome  2017  . HARDWARE REMOVAL Right 12/22/2017   Procedure: HARDWARE REMOVAL-RIGHT ARM;  Surgeon: Lyndle Herrlich, MD;  Location: [facility] ORS;  Service: Orthopedics;  Laterality: Right;  Removal of implant, superficial right humerus  . HUMERUS IM NAIL Right 03/11/2017   Procedure: INTRAMEDULLARY (IM) NAIL HUMERAL;  Surgeon: Lyndle Herrlich, MD;  Location: [facility] ORS;  Service: Orthopedics;  Laterality: Right;  . KIDNEY STONE SURGERY Right 02/12/12  . KIDNEY STONE SURGERY Left 07/15/2012  . LIGATION OF ARTERIOVENOUS  FISTULA Left 06/21/2016   Procedure: LIGATION OF ARTERIOVENOUS  FISTULA ( LIGATION BASILIC VEIN );  Surgeon: Renford Dills, MD;  Location: [facility] ORS;  Service: Vascular;  Laterality: Left;  . LITHOTRIPSY    . OOPHORECTOMY    .  SINUS EXPLORATION    . TONSILLECTOMY       OB History   None      Home Medications    Prior to Admission medications   Medication Sig Start Date End Date Taking? Authorizing Provider  alprazolam Prudy Feeler(XANAX) 2 MG tablet Take 2 mg by mouth 2 (two) times daily.     [provider]  amLODipine (NORVASC) 10 MG tablet Take 10 mg by mouth daily.    [provider]  atorvastatin (LIPITOR) 40 MG tablet Take 40 mg by mouth every evening.    [provider]  cephALEXin (KEFLEX) 500 MG capsule Take 2 capsules (1,000 mg total) by mouth 2  (two) times daily. Patient not taking: Reported on 05/01/2018 03/07/18   Arby BarrettePfeiffer, Marcy, MD  Cholecalciferol (VITAMIN D) 2000 units CAPS Take 2,000 Units by mouth daily.    [provider]  cloNIDine (CATAPRES) 0.1 MG tablet Take 0.1 mg by mouth daily.    [provider]  clopidogrel (PLAVIX) 75 MG tablet Take 1 tablet (75 mg total) by mouth daily. 06/27/16   Marvel PlanXu, Jindong, MD  cyclobenzaprine (FLEXERIL) 10 MG tablet Take 10 mg by mouth 3 (three) times daily as needed for muscle spasms.    [provider]  diltiazem (DILACOR XR) 180 MG 24 hr capsule Take 180 mg by mouth daily.    [provider]  docusate sodium (COLACE) 100 MG capsule Take 1 capsule (100 mg total) by mouth daily as needed. Patient not taking: Reported on 03/07/2018 12/22/17 12/22/18  Lyndle HerrlichBowers, James R, MD  glipiZIDE (GLUCOTROL) 5 MG tablet Take 1 tablet (5 mg total) by mouth 2 (two) times daily before a meal. 04/30/16   Lora PaulaKrall, Jennifer T, MD  HYDROcodone-acetaminophen (NORCO/VICODIN) 5-325 MG tablet Take 1 tablet by mouth every 4 (four) hours as needed. Patient taking differently: Take 1 tablet by mouth every 4 (four) hours as needed for severe pain.  03/03/18   Raeford RazorKohut, Stephen, MD  levETIRAcetam (KEPPRA) 500 MG tablet Take 500 mg by mouth 2 (two) times daily. 02/16/18   [provider]  losartan (COZAAR) 100 MG tablet Take 100 mg by mouth daily.  08/08/17   [provider]  methocarbamol (ROBAXIN) 500 MG tablet Take 1 tablet (500 mg total) by mouth 2 (two) times daily as needed for muscle spasms. 03/03/18   Raeford RazorKohut, Stephen, MD  omeprazole (PRILOSEC) 40 MG capsule Take 40 mg by mouth daily.    [provider]  ondansetron (ZOFRAN ODT) 4 MG disintegrating tablet Take 1 tablet (4 mg total) by mouth every 4 (four) hours as needed for nausea or vomiting. 03/07/18   Arby BarrettePfeiffer, Marcy, MD  oxyCODONE (ROXICODONE) 5 MG immediate release tablet Take 1 tablet (5 mg total) by mouth every 8 (eight)  hours as needed. Patient not taking: Reported on 03/07/2018 12/22/17 12/22/18  Lyndle HerrlichBowers, James R, MD  promethazine (PHENERGAN) 25 MG tablet Take 25 mg by mouth every 6 (six) hours as needed for nausea or vomiting.  08/08/17   [provider]  propranolol (INDERAL) 60 MG tablet Take 60 mg by mouth daily.    [provider]  zolpidem (AMBIEN) 10 MG tablet Take 10 mg by mouth at bedtime.  08/14/17   [provider]    Family History Family History  Problem Relation Age of Onset  . Heart failure Mother   . Diabetes Father   . Cancer Sister     Social History Social History   Tobacco Use  .  Smoking status: Current Every Day Smoker    Packs/day: 0.25    Years: 1.50    Pack years: 0.37    Types: Cigarettes  . Smokeless tobacco: Never Used  Substance Use Topics  . Alcohol use: No    Comment: no alcohol since 2002  . Drug use: No    Comment: electric cig     Allergies   Gabapentin; Tylenol [acetaminophen]; Aspirin; Buprenorphine hcl; Codeine; Compazine; Ioxaglate; Ivp dye [iodinated diagnostic agents]; Metrizamide; Naproxen; Norco [hydrocodone-acetaminophen]; Penicillin g; Penicillins; Prochlorperazine maleate; Reglan [metoclopramide]; Sulfur; Tegretol [carbamazepine]; Toradol [ketorolac tromethamine]; Tramadol; and Zofran [ondansetron hcl]   Review of Systems Review of Systems  Constitutional: Negative for chills and fever.  HENT: Negative for facial swelling and sore throat.   Respiratory: Negative for shortness of breath.   Cardiovascular: Negative for chest pain.  Gastrointestinal: Positive for abdominal pain (chronic), nausea and vomiting.  Genitourinary: Negative for dysuria.  Musculoskeletal: Positive for back pain and neck pain.  Skin: Negative for rash and wound.  Neurological: Positive for numbness (paresthesia) and headaches.  Psychiatric/Behavioral: The patient is not nervous/anxious.      Physical Exam Updated Vital Signs BP (!) 124/93  (BP Location: Left Arm)   Pulse 74   Temp 97.7 F (36.5 C) (Oral)   Resp 18   SpO2 99%   Physical Exam  Constitutional: She appears well-developed and well-nourished. No distress.  HENT:  Head: Normocephalic and atraumatic.  Mouth/Throat: Oropharynx is clear and moist. No oropharyngeal exudate.  Eyes: Pupils are equal, round, and reactive to light. Conjunctivae and EOM are normal. Right eye exhibits no discharge. Left eye exhibits no discharge. No scleral icterus.  Neck: Normal range of motion. Neck supple. No thyromegaly present.  Cardiovascular: Normal rate, regular rhythm, normal heart sounds and intact distal pulses. Exam reveals no gallop and no friction rub.  No murmur heard. Pulmonary/Chest: Effort normal and breath sounds normal. No stridor. No respiratory distress. She has no wheezes. She has no rales.  Abdominal: Soft. Bowel sounds are normal. She exhibits no distension. There is tenderness in the right upper quadrant and right lower quadrant. There is no rebound and no guarding.  Musculoskeletal: She exhibits no edema.  Lymphadenopathy:    She has no cervical adenopathy.  Neurological: She is alert. Coordination normal.  CN 3-12 intact; normal sensation throughout, however patient reports feeling pins and needles below knees bilaterally; 5/5 strength in all 4 extremities; equal bilateral grip strength  Skin: Skin is warm and dry. No rash noted. She is not diaphoretic. No pallor.  Psychiatric: She has a normal mood and affect.  Nursing note and vitals reviewed.    ED Treatments / Results  Labs (all labs ordered are listed, but only abnormal results are displayed) Labs Reviewed  COMPREHENSIVE METABOLIC PANEL - Abnormal; Notable for the following components:      Result Value   Glucose, Bld 108 (*)    All other components within normal limits  CBG MONITORING, ED - Abnormal; Notable for the following components:   Glucose-Capillary 106 (*)    All other components within  normal limits  CBC  LIPASE, BLOOD  URINALYSIS, ROUTINE W REFLEX MICROSCOPIC    EKG None  Radiology Dg Ribs Unilateral W/chest Right  Result Date: 05/01/2018 CLINICAL DATA:  Fall with right-sided rib pain EXAM: RIGHT RIBS AND CHEST - 3+ VIEW COMPARISON:  02/14/2017 FINDINGS: Single view chest demonstrates no acute consolidation or effusion. Normal heart size. No pneumothorax. Possible old fracture deformity of the  proximal right humerus. Right rib series demonstrates no acute displaced right rib fracture IMPRESSION: Negative. Electronically Signed   By: Jasmine Pang M.D.   On: 05/01/2018 21:28    Procedures Procedures (including critical care time)  Medications Ordered in ED Medications  sodium chloride 0.9 % bolus 1,000 mL (1,000 mLs Intravenous New Bag/Given 05/01/18 2058)  HYDROmorphone (DILAUDID) injection 1 mg (1 mg Intravenous Given 05/01/18 2058)  promethazine (PHENERGAN) injection 12.5 mg (12.5 mg Intravenous Given 05/01/18 2058)  HYDROmorphone (DILAUDID) injection 1 mg (1 mg Intravenous Given 05/01/18 2238)  promethazine (PHENERGAN) injection 12.5 mg (12.5 mg Intravenous Given 05/01/18 2238)     Initial Impression / Assessment and Plan / ED Course  I have reviewed the triage vital signs and the nursing notes.  Pertinent labs & imaging results that were available during my care of the patient were reviewed by me and considered in my medical decision making (see chart for details).  Clinical Course as of May 02 104  Fri May 01, 2018  2147 Patient's headache went from 12/10 to 8/10 after Dilaudid and Phenergan.  Will give her 1 more dose and plan for discharge home.   [AL]  Sat May 02, 2018  0003 Patient reports her headache is down to a 7/10.  After initial treatment, patient reports her numbness and tingling of her lower extremities have resolved.  Patient still trying to provide UA.   [AL]    Clinical Course User Index [AL] Emi Holes, PA-C    Patient  presenting with migraine headache typical of her normal.  Normal neuro exam without focal deficits.  Low suspicion of any other emergent headache as this is the same as her typical headaches.  Full range of motion of the neck without midline tenderness.  She is feeling better after her normal treatment of Dilaudid.  Labs are unremarkable.  Patient's numbness and tingling resolved after treatment.  Suspect may have been related to her headache.  This is abnormal distribution for any central nervous system issue.  Patient has no midline tenderness over her spine.  X-ray of her ribs are negative.  UA is pending at shift change. At shift change, patient care transferred to [facility], PA-C for continued evaluation, follow up of UA and determination of disposition. Anticipate discharge if UA negative. Follow up with PCP. I discussed patient case with Dr. Donnald Garre who guided the patient's management and agrees with plan.   Final Clinical Impressions(s) / ED Diagnoses   Final diagnoses:  Bad headache  Paresthesia  Rib pain on right side    ED Discharge Orders    None       Emi Holes, PA-C 05/02/18 0105    Arby Barrette, MD 05/08/18 0700

## 2018-05-01 NOTE — ED Triage Notes (Signed)
Pt complains of bilateral leg tingling. Left sided abdominal pain, vomiting, migraine, back pain, elevated blood sugar for the past few days. Pt had CBG of 413 today. Pt states she fell 3 times today.

## 2018-05-02 ENCOUNTER — Emergency Department (HOSPITAL_COMMUNITY): Payer: No Typology Code available for payment source

## 2018-05-02 LAB — URINALYSIS, ROUTINE W REFLEX MICROSCOPIC
Bilirubin Urine: NEGATIVE
GLUCOSE, UA: 50 mg/dL — AB
Hgb urine dipstick: NEGATIVE
KETONES UR: NEGATIVE mg/dL
Nitrite: NEGATIVE
PROTEIN: 30 mg/dL — AB
Specific Gravity, Urine: 1.018 (ref 1.005–1.030)
pH: 5 (ref 5.0–8.0)

## 2018-05-02 MED ORDER — MORPHINE SULFATE (PF) 4 MG/ML IV SOLN
4.0000 mg | Freq: Once | INTRAVENOUS | Status: AC
  Administered 2018-05-02: 4 mg via INTRAVENOUS
  Filled 2018-05-02: qty 1

## 2018-05-02 MED ORDER — NITROFURANTOIN MONOHYD MACRO 100 MG PO CAPS: 100.0000 mg | ORAL_CAPSULE | Freq: Two times a day (BID) | ORAL | 0 refills | Status: DC

## 2018-05-02 MED ORDER — NITROFURANTOIN MONOHYD MACRO 100 MG PO CAPS
100.0000 mg | ORAL_CAPSULE | Freq: Once | ORAL | Status: AC
  Administered 2018-05-02: 100 mg via ORAL
  Filled 2018-05-02: qty 1

## 2018-05-02 NOTE — ED Notes (Signed)
Pt bladder scanned. 61mL found. Provider aware.

## 2018-05-02 NOTE — ED Notes (Signed)
Discharge instructions reviewed with pt. Pt verbalized understanding. Pt to follow up with PCP and neuro. PIV removed. Pt assisted to waiting room via wheelchair.

## 2018-05-02 NOTE — Discharge Instructions (Addendum)
Please follow-up with your doctor early next week for recheck.  Please return the emergency department if you develop any new or worsening symptoms.  Take antibiotics for urinary tract infection.  I will also recommend you follow-up with your neurologist the beginning of next week.  Return immediately to the ED if you develop any worsening headaches or for any other reasons.

## 2018-05-02 NOTE — ED Provider Notes (Signed)
Care assumed from previous provider PA Law. Please see their note for further details to include full history and physical. To summarize in short pt is a 52 year old female past medical history significant for chronic headaches, intracranial hemorrhage, hypertension, diabetes, sleep apnea resents to the ED for evaluation of headache and back pain.. Case discussed, plan agreed upon.   At time of care handoff was awaiting UA as she stated she had some concern for urinary tract infection.  Urine with RBCs, WBCs, rare bacteria and white blood cell clumps.  This appears similar to her prior UAs.  Will send for culture.  Patient has significant medication allergy.  Will give Macrobid.  Patient has no prior urine culture.  Patient has no CVA tenderness.  She is afebrile.  No leukocytosis.  Doubt pyelonephritis. Pt has no abd pain.   Patient states that her headache has returned.  She states that the only thing that works for her headache is Dilaudid and morphine.  Patient has a significant allergy history.  She is seen by neurology for chronic headaches.  She states that her headache started after she fell this afternoon.  She does report hitting her head.  Will obtain CT imaging to rule any intracranial bleeding.  Then patient states that she is having headaches since she was 52 years old.  Remote infarct was noted but no signs of acute intracranial hemorrhage.  On repeat assessment patient was snoring in the room.  When awakened she states that she feels significantly improved and ready for discharge.  Patient has been encouraged to follow-up with her neurologist on Monday.  Have also been encouraged to return the ED if you develop any worsening headaches or for any other reason.  Pt is hemodynamically stable, in NAD, & able to ambulate in the ED. Evaluation does not show pathology that would require ongoing emergent intervention or inpatient treatment. I explained the diagnosis to the patient. Pain has been  managed & has no complaints prior to dc. Pt is comfortable with above plan and is stable for discharge at this time. All questions were answered prior to disposition. Strict return precautions for f/u to the ED were discussed. Encouraged follow up with PCP.        Rise MuLeaphart, Jia Dottavio T, PA-C 05/02/18 [id number]    Arby BarrettePfeiffer, Marcy, MD 05/08/18 0700

## 2018-05-03 LAB — URINE CULTURE: Culture: <10000 — AB

## 2018-05-06 ENCOUNTER — Emergency Department
Admission: EM | Admit: 2018-05-06 | Discharge: 2018-05-06 | Disposition: A | Discharge status: Home / Self Care | Payer: No Typology Code available for payment source | Attending: Emergency Medicine | Admitting: Emergency Medicine

## 2018-05-06 ENCOUNTER — Other Ambulatory Visit: Payer: Self-pay

## 2018-05-06 ENCOUNTER — Emergency Department: Payer: No Typology Code available for payment source

## 2018-05-06 DIAGNOSIS — R42 Dizziness and giddiness: Secondary | ICD-10-CM | POA: Insufficient documentation

## 2018-05-06 DIAGNOSIS — Y93K9 Activity, other involving animal care: Secondary | ICD-10-CM | POA: Insufficient documentation

## 2018-05-06 DIAGNOSIS — Y929 Unspecified place or not applicable: Secondary | ICD-10-CM | POA: Insufficient documentation

## 2018-05-06 DIAGNOSIS — F17210 Nicotine dependence, cigarettes, uncomplicated: Secondary | ICD-10-CM | POA: Insufficient documentation

## 2018-05-06 DIAGNOSIS — W19XXXA Unspecified fall, initial encounter: Secondary | ICD-10-CM

## 2018-05-06 DIAGNOSIS — I10 Essential (primary) hypertension: Secondary | ICD-10-CM | POA: Insufficient documentation

## 2018-05-06 DIAGNOSIS — W01198A Fall on same level from slipping, tripping and stumbling with subsequent striking against other object, initial encounter: Secondary | ICD-10-CM | POA: Insufficient documentation

## 2018-05-06 DIAGNOSIS — E119 Type 2 diabetes mellitus without complications: Secondary | ICD-10-CM | POA: Insufficient documentation

## 2018-05-06 DIAGNOSIS — N3000 Acute cystitis without hematuria: Secondary | ICD-10-CM

## 2018-05-06 DIAGNOSIS — Y999 Unspecified external cause status: Secondary | ICD-10-CM | POA: Insufficient documentation

## 2018-05-06 DIAGNOSIS — G43811 Other migraine, intractable, with status migrainosus: Secondary | ICD-10-CM | POA: Diagnosis not present

## 2018-05-06 DIAGNOSIS — S50312A Abrasion of left elbow, initial encounter: Secondary | ICD-10-CM | POA: Insufficient documentation

## 2018-05-06 LAB — URINALYSIS, COMPLETE (UACMP) WITH MICROSCOPIC
BILIRUBIN URINE: NEGATIVE
Glucose, UA: 150 mg/dL — AB
HGB URINE DIPSTICK: NEGATIVE
Ketones, ur: NEGATIVE mg/dL
NITRITE: NEGATIVE
PH: 7 (ref 5.0–8.0)
Protein, ur: NEGATIVE mg/dL
SPECIFIC GRAVITY, URINE: 1.006 (ref 1.005–1.030)
WBC, UA: >50 WBC/hpf — ABNORMAL HIGH (ref 0–5)

## 2018-05-06 LAB — COMPREHENSIVE METABOLIC PANEL
ALK PHOS: 94 U/L (ref 38–126)
ALT: 22 U/L (ref 0–44)
AST: 32 U/L (ref 15–41)
Albumin: 3.8 g/dL (ref 3.5–5.0)
Anion gap: 7 (ref 5–15)
BUN: 8 mg/dL (ref 6–20)
CALCIUM: 9 mg/dL (ref 8.9–10.3)
CO2: 26 mmol/L (ref 22–32)
Chloride: 109 mmol/L (ref 98–111)
Creatinine, Ser: 0.5 mg/dL (ref 0.44–1.00)
Glucose, Bld: 134 mg/dL — ABNORMAL HIGH (ref 70–99)
Potassium: 4.2 mmol/L (ref 3.5–5.1)
Sodium: 142 mmol/L (ref 135–145)
Total Bilirubin: 1 mg/dL (ref 0.3–1.2)
Total Protein: 6.7 g/dL (ref 6.5–8.1)

## 2018-05-06 LAB — CBC
HCT: 43.5 % (ref 35.0–47.0)
Hemoglobin: 14.8 g/dL (ref 12.0–16.0)
MCH: 31.5 pg (ref 26.0–34.0)
MCHC: 34.1 g/dL (ref 32.0–36.0)
MCV: 92.3 fL (ref 80.0–100.0)
PLATELETS: 189 10*3/uL (ref 150–440)
RBC: 4.71 MIL/uL (ref 3.80–5.20)
RDW: 14.3 % (ref 11.5–14.5)
WBC: 7.3 10*3/uL (ref 3.6–11.0)

## 2018-05-06 LAB — TROPONIN I: Troponin I: <0.03 ng/mL (ref <0.03)

## 2018-05-06 MED ORDER — CIPROFLOXACIN IN D5W 400 MG/200ML IV SOLN
400.0000 mg | Freq: Once | INTRAVENOUS | Status: AC
  Administered 2018-05-06: 400 mg via INTRAVENOUS
  Filled 2018-05-06: qty 200

## 2018-05-06 MED ORDER — MECLIZINE HCL 25 MG PO TABS
25.0000 mg | ORAL_TABLET | Freq: Once | ORAL | Status: AC
  Administered 2018-05-06: 25 mg via ORAL
  Filled 2018-05-06: qty 1

## 2018-05-06 MED ORDER — SODIUM CHLORIDE 0.9 % IV BOLUS
1000.0000 mL | Freq: Once | INTRAVENOUS | Status: AC
  Administered 2018-05-06: 1000 mL via INTRAVENOUS

## 2018-05-06 MED ORDER — PROMETHAZINE HCL 25 MG/ML IJ SOLN
12.5000 mg | Freq: Once | INTRAMUSCULAR | Status: AC
  Administered 2018-05-06: 12.5 mg via INTRAVENOUS
  Filled 2018-05-06: qty 1

## 2018-05-06 MED ORDER — HYDROMORPHONE HCL 1 MG/ML IJ SOLN
0.5000 mg | Freq: Once | INTRAMUSCULAR | Status: AC
  Administered 2018-05-06: 0.5 mg via INTRAVENOUS
  Filled 2018-05-06: qty 1

## 2018-05-06 MED ORDER — CIPROFLOXACIN HCL 500 MG PO TABS: 500.0000 mg | ORAL_TABLET | Freq: Two times a day (BID) | ORAL | 0 refills | Status: AC

## 2018-05-06 NOTE — ED Provider Notes (Addendum)
[facility] Emergency Department Provider Note  ____________________________________________  Time seen: Approximately 3:11 PM  I have reviewed the triage vital signs and the nursing notes.   HISTORY  Chief Complaint Fall    HPI Stephanie [name] is a 52 y.o. female history of CVA on Plavix, HTN, DM, chronic migraines, presenting for migraine with dizziness and fall.  The patient reports that the past 3 days she has had a typical migraine with a sensation "like my brain is swelling."  This has been associated with several episodes of nausea and vomiting.  She has tried Excedrin Migraine x4 without any improvement.  She reports chronic 2-3 times monthly migraines; today, her symptoms are typical in character and severity.  She has not had any thunderclap sensation.  Today, the patient also experienced a room spinning dizziness when she got out of bed, and she went to walk her dog when she got "tangled up" in the leash and sustained a fall hitting the right side of her head on a brick.  She did not lose consciousness and was able to get back up, went indoors and once again got tangled in the lesion fell striking her head on the coffee table, also on the right side of the head.  Reports dizziness was worse with left lateral gaze.  The patient denies any associated blurred or double vision, new numbness tingling or weakness, any chest pain, shortness of breath, palpitations.    Past Medical History:  Diagnosis Date  . Diabetes mellitus without complication (HCC)   . Difficult intubation   . Diverticulitis   . Headache   . Hepatitis C    treated and resolved  . History of kidney stones   . Hypertension   . IBS (irritable bowel syndrome)   . Kidney stones   . Sciatica   . Sleep apnea    on cpap  . Stroke [facility]) 2017   memory loss    Patient Active Problem List   Diagnosis Date Noted  . Shoulder pain, right 12/22/2017  . UTI (urinary tract infection) 08/21/2017   . Nausea & vomiting 08/21/2017  . Nicotine dependence 08/21/2017  . Headache 01/07/2017  . Small bowel obstruction (HCC) 09/02/2016  . Pain and swelling of left upper extremity 07/22/2016  . Superficial venous thrombosis of arm, left 07/22/2016  . ICH (intracerebral hemorrhage) (HCC)   . Essential hypertension, malignant 04/19/2016  . Cytotoxic brain edema (HCC) 04/19/2016  . IVH (intraventricular hemorrhage) (HCC) 04/18/2016  . Hepatitis C     Past Surgical History:  Procedure Laterality Date  . ABDOMINAL HYSTERECTOMY    . carpel tunnel syndrome  2017  . HARDWARE REMOVAL Right 12/22/2017   Procedure: HARDWARE REMOVAL-RIGHT ARM;  Surgeon: Lyndle Herrlich, MD;  Location: [facility] ORS;  Service: Orthopedics;  Laterality: Right;  Removal of implant, superficial right humerus  . HUMERUS IM NAIL Right 03/11/2017   Procedure: INTRAMEDULLARY (IM) NAIL HUMERAL;  Surgeon: Lyndle Herrlich, MD;  Location: [facility] ORS;  Service: Orthopedics;  Laterality: Right;  . KIDNEY STONE SURGERY Right 02/12/12  . KIDNEY STONE SURGERY Left 07/15/2012  . LIGATION OF ARTERIOVENOUS  FISTULA Left 06/21/2016   Procedure: LIGATION OF ARTERIOVENOUS  FISTULA ( LIGATION BASILIC VEIN );  Surgeon: Renford Dills, MD;  Location: [facility] ORS;  Service: Vascular;  Laterality: Left;  . LITHOTRIPSY    . OOPHORECTOMY    . SINUS EXPLORATION    . TONSILLECTOMY      Current Outpatient Rx  .  Order #: [id number] Class: Historical Med  . Order #: [id number] Class: Normal  . Order #: [id number] Class: Historical Med  . Order #: [id number] Class: Historical Med  . Order #: [id number] Class: Normal  . Order #: [id number] Class: Historical Med  . Order #: [id number] Class: Historical Med  . Order #: [id number] Class: Print  . Order #: [id number] Class: Historical Med  . Order #: [id number] Class: Historical Med  . Order #: [id number] Class: Historical Med  . Order #: [id number] Class: Historical Med  . Order #: [id number] Class: Print  . Order #:  [id number] Class: Normal  . Order #: [id number] Class: Normal  . Order #: [id number] Class: Print  . Order #: [id number] Class: Print  . Order #: [id number] Class: Print  . Order #: [id number] Class: Normal  . Order #: [id number] Class: Historical Med    Allergies Gabapentin; Tylenol [acetaminophen]; Aspirin; Buprenorphine hcl; Codeine; Compazine; Ioxaglate; Ivp dye [iodinated diagnostic agents]; Metrizamide; Naproxen; Norco [hydrocodone-acetaminophen]; Penicillin g; Penicillins; Prochlorperazine maleate; Reglan [metoclopramide]; Sulfur; Tegretol [carbamazepine]; Toradol [ketorolac tromethamine]; Tramadol; and Zofran [ondansetron hcl]  Family History  Problem Relation Age of Onset  . Heart failure Mother   . Diabetes Father   . Cancer Sister     Social History Social History   Tobacco Use  . Smoking status: Current Every Day Smoker    Packs/day: 1.00    Years: 1.50    Pack years: 1.50    Types: Cigarettes  . Smokeless tobacco: Never Used  Substance Use Topics  . Alcohol use: No    Comment: no alcohol since 2002  . Drug use: No    Comment: electric cig    Review of Systems Constitutional: No fever/chills.  Positive dizziness.  Negative lightheadedness.  No loss of consciousness.  Falls x2. Eyes: No visual changes.  No blurred or double vision. ENT: No sore throat. No congestion or rhinorrhea. Cardiovascular: Denies chest pain. Denies palpitations. Respiratory: Denies shortness of breath.  No cough. Gastrointestinal: No abdominal pain.  Positive nausea, positive vomiting.  No diarrhea.  No constipation. Genitourinary: Negative for dysuria. Musculoskeletal: Negative for back pain.  Denies any neck pain.   Skin: Negative for rash.  Positive abrasion to the inner left elbow. Neurological: Positive for headache. No focal numbness, or weakness.  Patient reports that for the past several months she has had bilateral leg tingling and has been started on gabapentin by her primary care physician  for suspected diabetic neuropathy; she does not feel that this medication is improving her symptoms.  She denies any blurred or double vision, changes in speech, changes in mental status.  She has had some difficulty walking due to her dizziness.    ____________________________________________   PHYSICAL EXAM:  VITAL SIGNS: ED Triage Vitals  Enc Vitals Group     BP 05/06/18 1438 (!) 165/94     Pulse Rate 05/06/18 1438 91     Resp 05/06/18 1438 16     Temp 05/06/18 1438 98 F (36.7 C)     Temp Source 05/06/18 1438 Oral     SpO2 05/06/18 1438 96 %     Weight 05/06/18 1439 190 lb (86.2 kg)     Height 05/06/18 1439 5\' 2"  (1.575 m)     Head Circumference --      Peak Flow --      Pain Score 05/06/18 1438 9     Pain Loc --      Pain Edu? --      Excl. in GC? --     Constitutional: Alert and oriented.  Answers questions appropriately.  GCS is 15. Eyes: Conjunctivae are normal.  EOMI. PERRLA no scleral icterus.  No raccoon eyes. Head: Atraumatic.  No palpable hematoma over the scalp.  No battle sign. Nose: No congestion/rhinnorhea.  No swelling over the nose or septal hematoma. Mouth/Throat: Mucous membranes are mildly dry.  No dental injury or malocclusion.  Positive poor dentition diffusely. Neck: No stridor.  Supple.  No JVD.  No midline C-spine tenderness to palpation, step-offs or deformities.  Full range of motion without pain.  No meningismus peer Cardiovascular: Normal rate, regular rhythm. No murmurs, rubs or gallops.  Respiratory: Normal respiratory effort.  No accessory muscle use or retractions. Lungs CTAB.  No wheezes, rales or ronchi. Gastrointestinal: Obese.  Soft, nontender and nondistended.  No guarding or rebound.  No peritoneal signs. Musculoskeletal: No LE edema. No ttp in the calves or palpable cords.  Negative Homan's sign. Neurologic: Alert and oriented 3. Speech is clear. Face and smile symmetric. Tongue is midline.  EOMI.  PERRLA.  Mild extinguishable  nystagmus with left lateral gaze. No pronator drift. 5 out of 5 grip, biceps, triceps, hip flexors, plantar flexion and dorsiflexion. Normal sensation to light touch in the bilateral upper and lower extremities, and face. Normal heel-to-shin. Skin:  Skin is warm, dry.  2 cm x 1 cm superficial abrasion to the medial left elbow. Psychiatric: Depressed mood with flat affect.  ____________________________________________   LABS (all labs ordered are listed, but only abnormal results are displayed)  Labs Reviewed  COMPREHENSIVE METABOLIC PANEL - Abnormal; Notable for the following components:      Result Value   Glucose, Bld 134 (*)    All other components within normal limits  URINALYSIS, COMPLETE (UACMP) WITH MICROSCOPIC - Abnormal; Notable for the following components:   Color, Urine YELLOW (*)    APPearance CLEAR (*)    Glucose, UA 150 (*)    Leukocytes, UA MODERATE (*)    WBC, UA >50 (*)    Bacteria, UA RARE (*)    All other components within normal limits  CBC  TROPONIN I   ____________________________________________  EKG  ED ECG REPORT I, Anne-Caroline Sharma CovertNorman, the attending physician, personally viewed and interpreted this ECG.   Date: 05/06/2018  EKG Time: 1456  Rate: 89  Rhythm: normal sinus rhythm  Axis: leftward  Intervals:none  ST&T Change: No STEMI  ____________________________________________  RADIOLOGY  Dg Chest 2 View  Result Date: 05/06/2018 CLINICAL DATA:  Dizziness. EXAM: CHEST - 2 VIEW COMPARISON:  Radiographs of May 01, 2018. FINDINGS: The heart size and mediastinal contours are within normal limits. Both lungs are clear. No pneumothorax or pleural effusion is noted. The visualized skeletal structures are unremarkable. IMPRESSION: No active cardiopulmonary disease. Electronically Signed   By: Lupita RaiderJames  Green Jr, M.D.   On: 05/06/2018 16:08   Ct Head Wo Contrast  Result Date: 05/06/2018 CLINICAL DATA:  Neuro deficits, subacute. Hit head on brick this  morning. Subsequent falls and difficulty standing [place]. EXAM: CT HEAD WITHOUT CONTRAST TECHNIQUE: Contiguous axial images were obtained from the base of the skull through the vertex without intravenous contrast. COMPARISON:  CT head without contrast 05/02/2018. FINDINGS: Brain: The remote posterior left temporal and occipital lobe infarct is again seen. Associated ex vacuo dilation of the left lateral ventricle is noted. No acute infarct, hemorrhage, or mass lesion is present. The ventricles are of normal size. No significant extra-axial fluid collection present. Vascular: Atherosclerotic calcifications are present within the cavernous internal carotid arteries bilaterally. There is no hyperdense vessel.  Skull: No focal lytic or blastic lesions are present. No acute fractures present. No significant extracranial soft tissue injury is present. Sinuses/Orbits: Postsurgical changes are noted in the right ethmoid air cells and right maxillary sinus. Minimal mucosal thickening is present in the residual right ethmoid air cells. The paranasal sinuses and mastoid air cells are otherwise clear. Globes and orbits are within normal limits. IMPRESSION: 1. Stable remote posterior left temporal and occipital lobe infarct. 2. No acute intracranial abnormality or significant interval change. 3. Postsurgical changes of the right ethmoid air cells and maxillary sinus without significant interval change. Electronically Signed   By: Marin Roberts M.D.   On: 05/06/2018 15:59    ____________________________________________   PROCEDURES  Procedure(s) performed: None  Procedures  Critical Care performed: No ____________________________________________   INITIAL IMPRESSION / ASSESSMENT AND PLAN / ED COURSE  Pertinent labs & imaging results that were available during my care of the patient were reviewed by me and considered in my medical decision making (see chart for details).  52 y.o. female with a prior  history of CVA on Plavix, multiple other comorbidities, presenting with 3 days of chronic and typical migraine, today with dizziness and 2 falls.  Overall, the patient is hypertensive to 165/94 here but otherwise hemodynamically stable.  She has some mild nystagmus on my examination, which may be indicative of a peripheral vertigo.  There are no other focal neurologic abnormalities on her exam.  However, she does have several risk factors for CVA and we will start with a noncontrasted CT scan and follow with CT Angie of the head neck if that is negative for evaluation of posterior stroke.  In the meantime, she will receive treatment for her migraine as well as possible vertigo and we will follow her clinical course.   From a trauma standpoint, CT scan will allow [place] to rule out any injury.  I do not see any other evidence of acute traumatic injury today.  Plan reevaluation for final disposition.  ----------------------------------------- 4:56 PM on 05/06/2018 -----------------------------------------  The patient's urinalysis does show bacteria and white blood cells as well as leukocyte esterase; I will treat her with ciprofloxacin and send her urine for culture.  Her white blood cell count is reassuring and her electrolytes are also reassuring.  CT of her head does not show any acute process, and her chest x-ray does not show any active cardiopulmonary disease.  At this time, the patient continues to rest and we are continuing treatment for her migraine.  I have spoken with Dr. Thad Ranger, who agrees that if the patient undergoes MRI and does not have any focal findings, stroke evaluation has been complete and the patient can be discharged home.  ----------------------------------------- 7:38 PM on 05/06/2018 -----------------------------------------  Patient is remained hemodynamically stable and neurologically unchanged in the emergency department.  I have given her two doses of Dilaudid, 1 dose of  Phenergan, and intravenous fluids to help with her headache.  We had a long discussion about transitioning to a nonnarcotic option and I have offered her multiple medications for her symptoms, including Tylenol, as well as nonmedication treatments such as an ice pack.  At this time, the patient understands the risks and the benefits including worsening symptoms, neurologic devastation and death, and is not willing to stay for completion of her evaluation with MRI.  She and her husband were present when I gave strict return precautions as well as follow-up instructions.  The patient is leaving AGAINST MEDICAL ADVICE at this  time.  ____________________________________________  FINAL CLINICAL IMPRESSION(S) / ED DIAGNOSES  Final diagnoses:  Abrasion of left elbow, initial encounter  Acute cystitis without hematuria  Dizziness  Fall, initial encounter  Other migraine with status migrainosus, intractable    Clinical Course as of May 06 1938  Wed May 06, 2018  1730 The patient is starting to feel better at this time.  She states that she is currently under treatment with Macrobid for UTI.  Given that she has had 4 days and continues to have a UTI and her UA, we will give her IV Cipro and plan to discharge home home with a prescription for ciprofloxacin.  She knows that she needs to stop taking the Macrobid.  A urine culture will be sent.  I am awaiting the patient's MRI, and if that is reassuring, we will plan discharge home.   [AN]    Clinical Course User Index [AN] Rockne Menghini, MD      NEW MEDICATIONS STARTED DURING THIS VISIT:  New Prescriptions   CIPROFLOXACIN (CIPRO) 500 MG TABLET    Take 1 tablet (500 mg total) by mouth 2 (two) times daily for 5 days.      Rockne Menghini, MD 05/06/18 1657    Rockne Menghini, MD 05/06/18 [phone]

## 2018-05-06 NOTE — ED Notes (Signed)
Patient unable to sign the AMA e signature due to computers in hallways e signature pads not working

## 2018-05-06 NOTE — ED Notes (Signed)
Patient transported to CT 

## 2018-05-06 NOTE — ED Triage Notes (Signed)
Pt arrived via EMS from home c/o dizziness, weakness, poor appetite, diplopia, blurred vision, headache and 2 falls today. At approx. 0700 when she was walking her dog she states that she hit the right side of her head on a brick and around 0730 she fell inside the house onto the table. After falling, she states that she is having difficulty staying awake. Hx of migraines, hyperglycemia and a stroke in 2017.

## 2018-05-06 NOTE — ED Notes (Signed)
MRI called to come and take patient back for scan and pt is refusing to go unless given more dilaudid or moprhine

## 2018-05-06 NOTE — Discharge Instructions (Addendum)
Today your left before your evaluation was complete.  It is possible that your symptoms are related to a stroke, and that if you go home without complete evaluation you may have worsening symptoms or even die.  Return to the emergency department if you develop severe pain, lightheadedness or fainting, numbness tingling or weakness, vomiting, or any other symptoms concerning to you.

## 2018-05-06 NOTE — ED Notes (Signed)
Patient states to this RN "if yall are not going to give me anything else for my pain then I am leaving" MD made aware and is going to speak with patient. Patient is requesting more dilaudid or morphine

## 2018-05-06 NOTE — ED Notes (Signed)
This RN saw patients ride in car waiting on her to get in car

## 2018-05-10 IMAGING — US US RENAL
1 series · 14 of 25 positions shown · non-contrast
Comparison: CT 08/21/2017.

CLINICAL DATA: Flank pain with pyuria and hematuria.

EXAM:
RENAL / URINARY TRACT ULTRASOUND COMPLETE

[Series 1: us renal · 0.25mm/px · 14 of 43 slices shown]
[im 1/43]
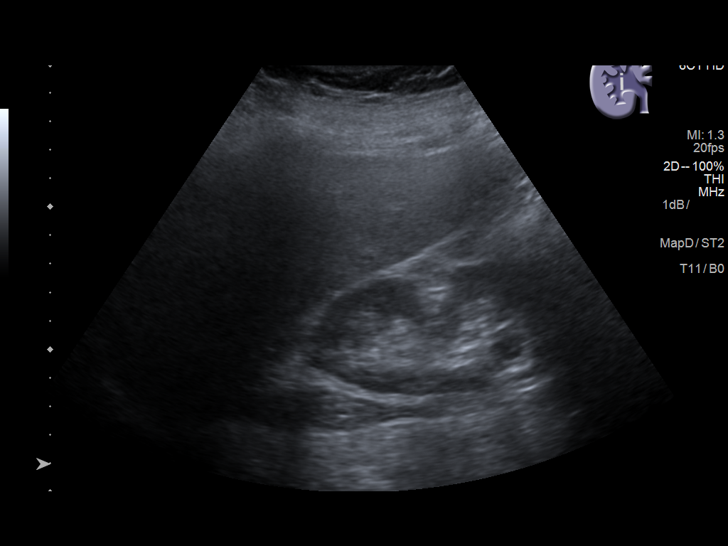
[im 4/43]
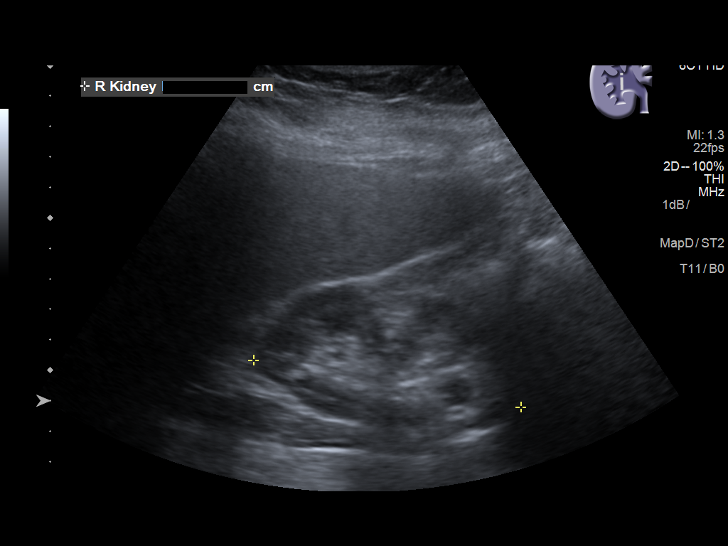
[im 8/43]
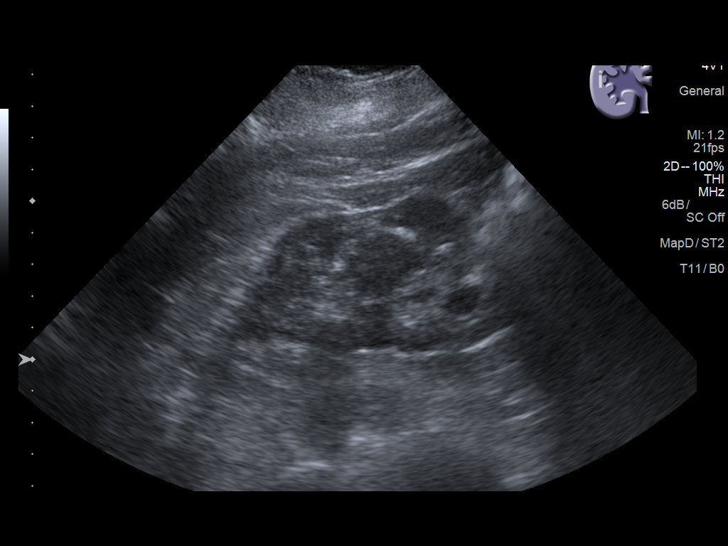
[im 11/43]
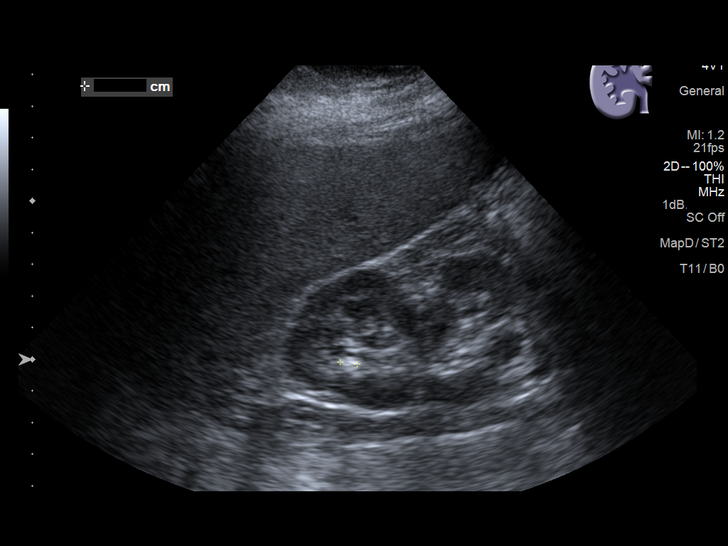
[im 15/43]
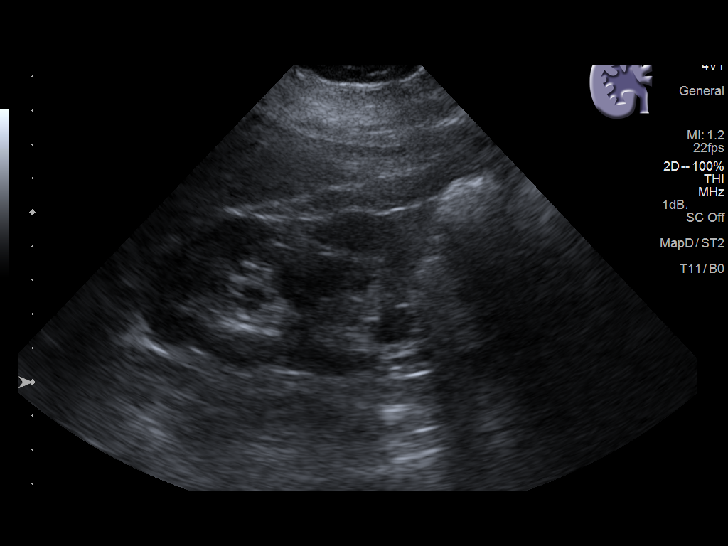
[im 16/43]
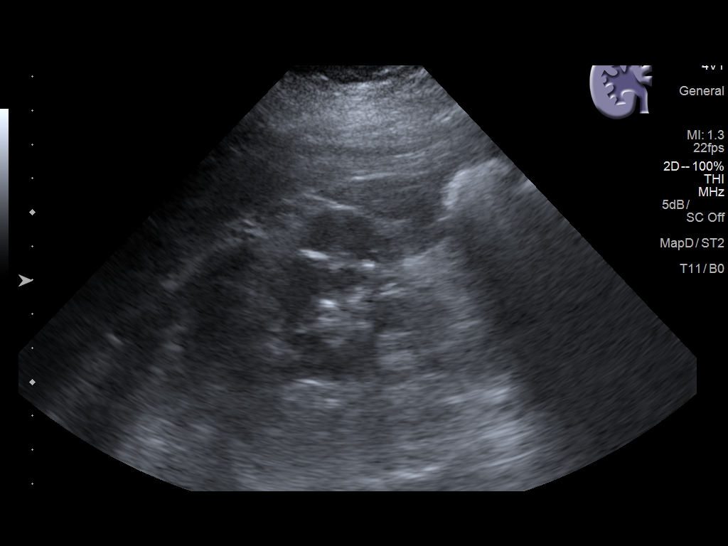
[im 20/43]
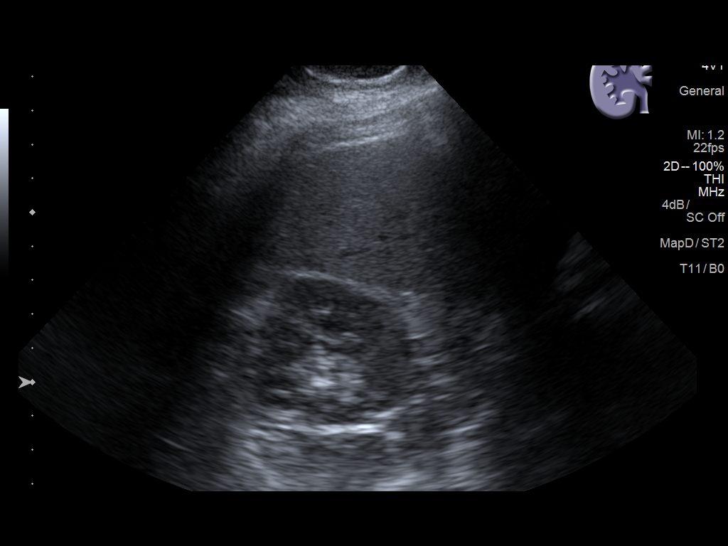
[im 23/43]
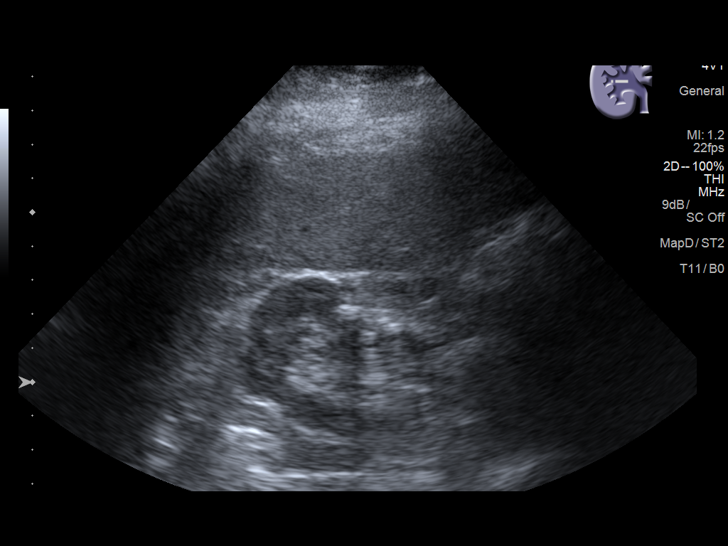
[im 27/43]
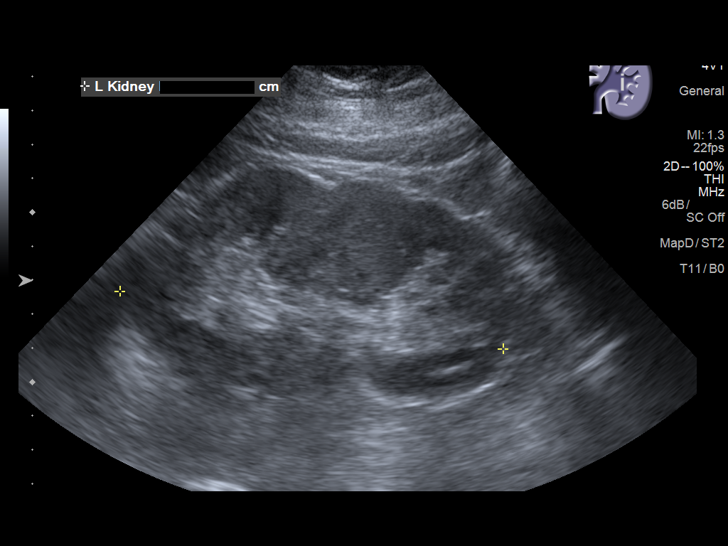
[im 29/43]
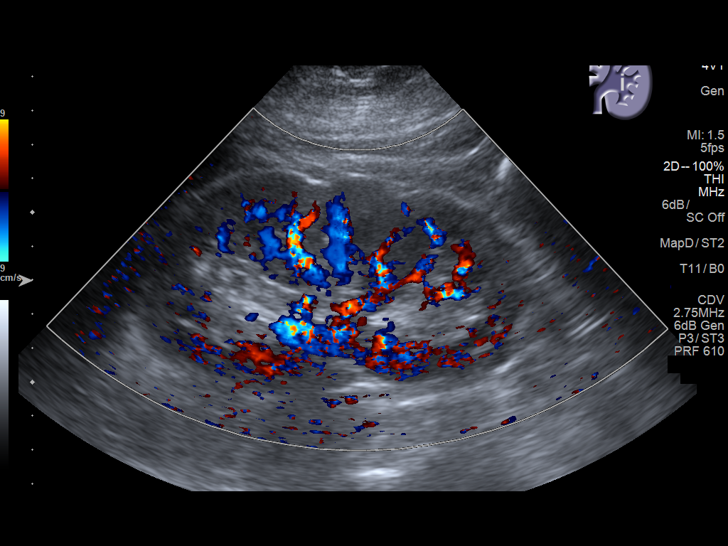
[im 32/43]
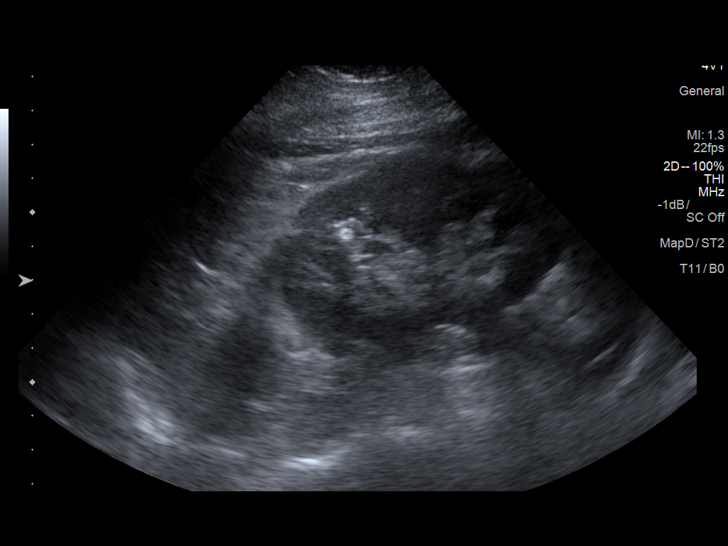
[im 36/43]
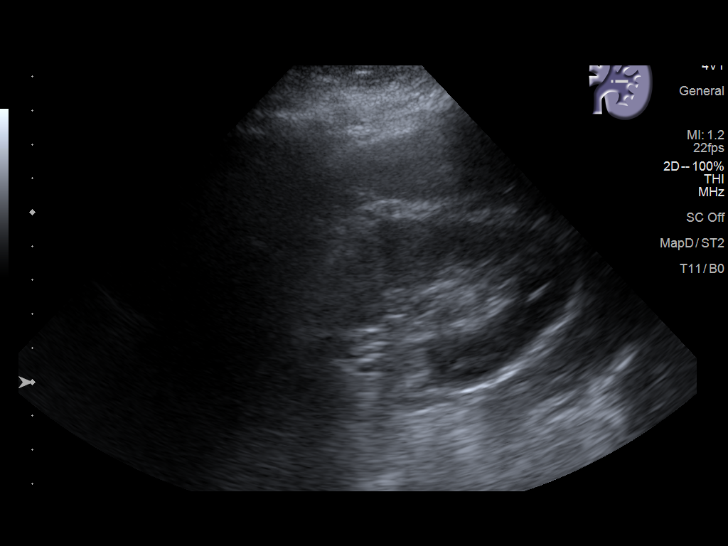
[im 39/43]
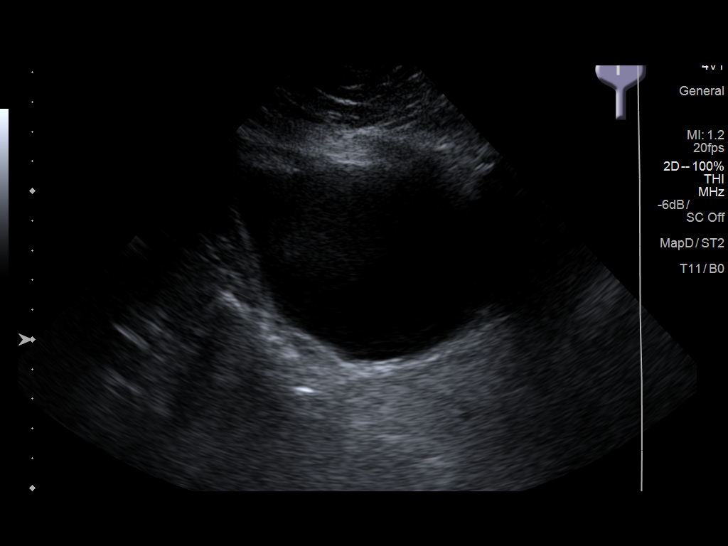
[im 43/43]
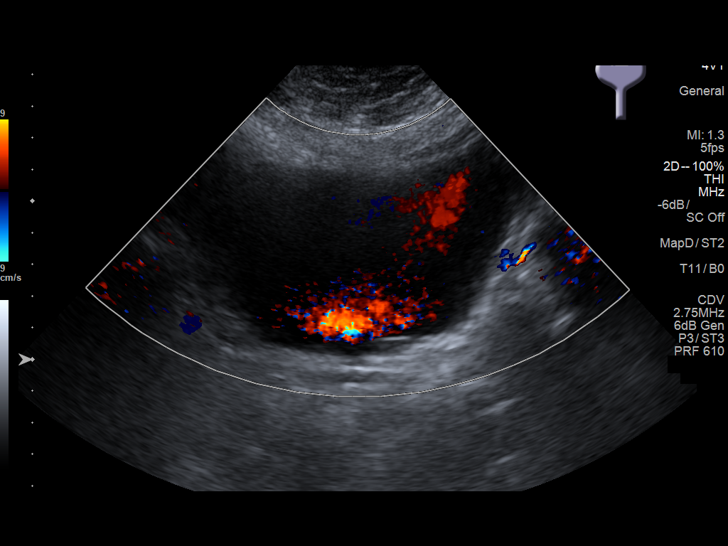

[14 of 25 positions shown; findings below may reference images not displayed]

FINDINGS: Right Kidney:

Length: 8.9 cm. Echogenicity within normal limits. No mass or
hydronephrosis visualized. Echogenic nonobstructing renal calculi
are noted of the right kidney, the largest is interpolar measuring
approximately 8 mm. Renal cortical scarring is also seen along the
interpolar aspect. Lower pole renal cyst measuring 1.2 x 1.2 x 1 1
cm is noted.

Left Kidney:

Length: 11.4 cm. Echogenicity within normal limits. No mass or
hydronephrosis visualized. There is a 4.5 mm upper pole renal
calculus.

Bladder:

Appears normal for degree of bladder distention. Demonstrated
bilateral ureteral jets. No focal mural thickening or bladder
calculus.
IMPRESSION: Bilateral nonobstructing renal calculi. Patent bilateral ureters. No
focal mural thickening of the bladder nor mass noted.

## 2018-05-19 IMAGING — CT CT HEAD W/O CM
3 series · 16 of 47 positions shown, 19 images · non-contrast
Comparison: 04/18/2016 at [DATE] p.m..

CLINICAL DATA: 50-year-old female -follow-up left intraparenchymal
hemorrhage. Possible blown pupil.

EXAM:
CT HEAD WITHOUT CONTRAST
TECHNIQUE: Contiguous axial images were obtained from the base of the skull
through the vertex without intravenous contrast.

[Series 2: head 5.0 h30s · axial · 0.41mm/px · z∈[-167,-27]mm · 10 of 34 slices shown, 13 images]
[im 3/34  brain]
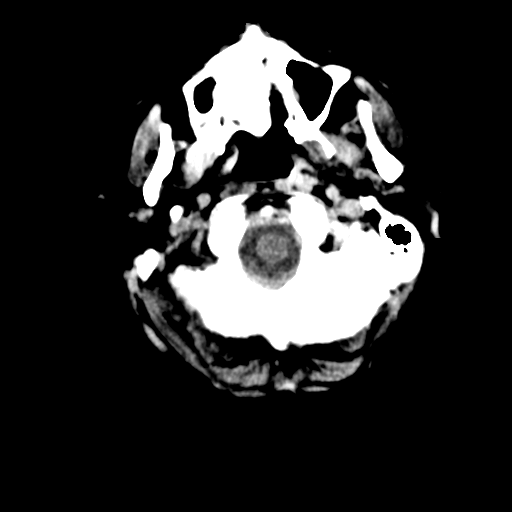
[im 3/34  bone]
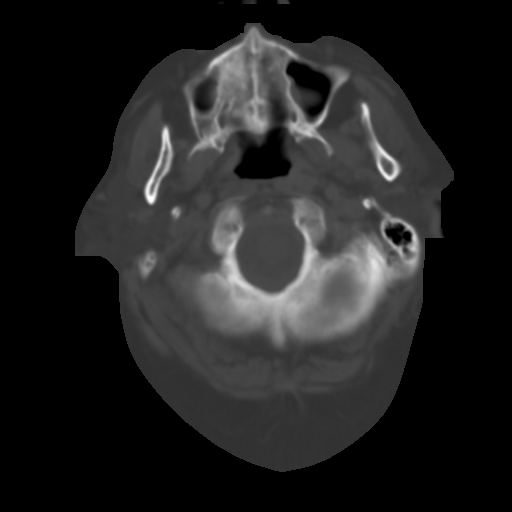
[im 6/34  brain]
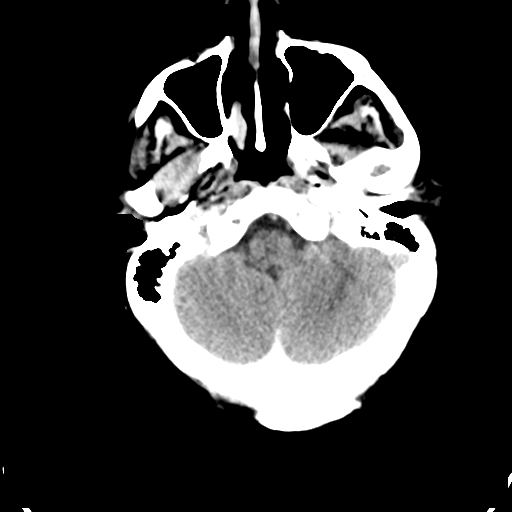
[im 10/34  brain]
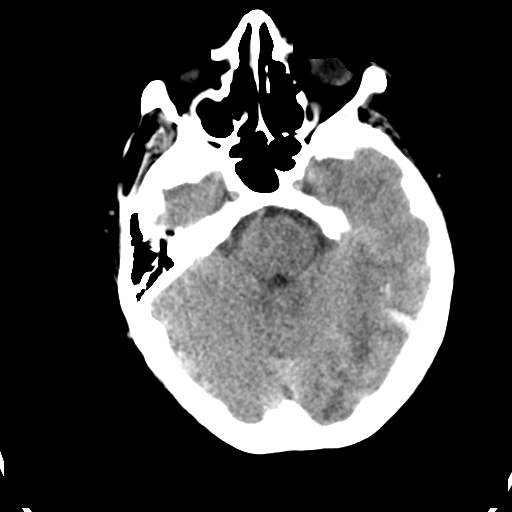
[im 12/34  brain]
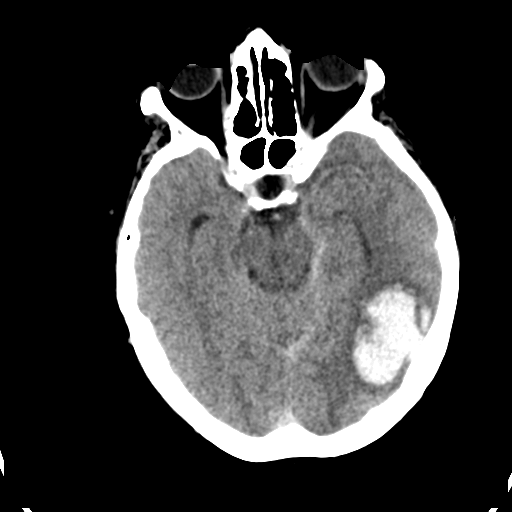
[im 15/34  brain]
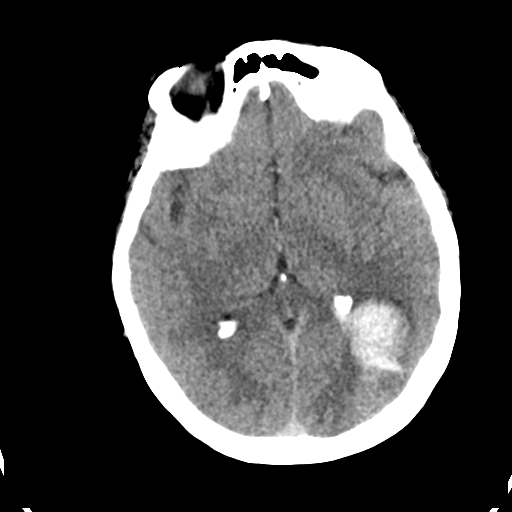
[im 15/34  bone]
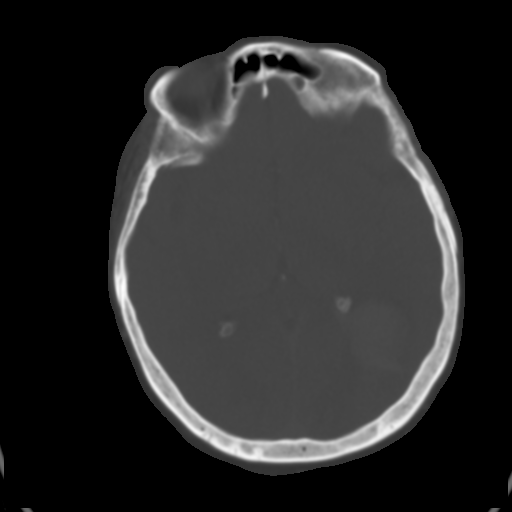
[im 19/34  brain]
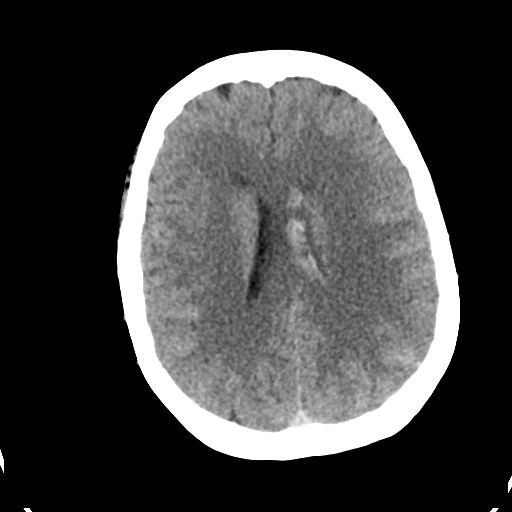
[im 22/34  brain]
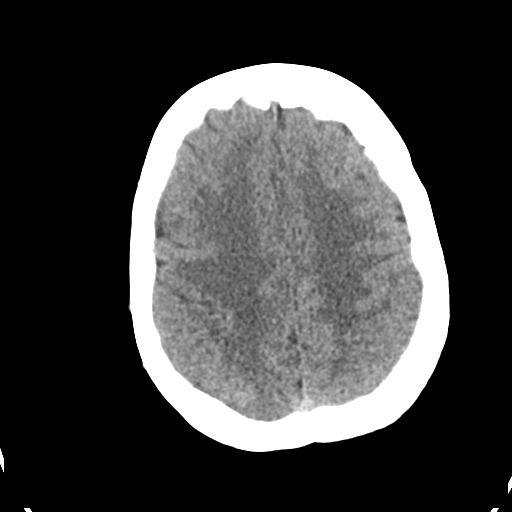
[im 26/34  brain]
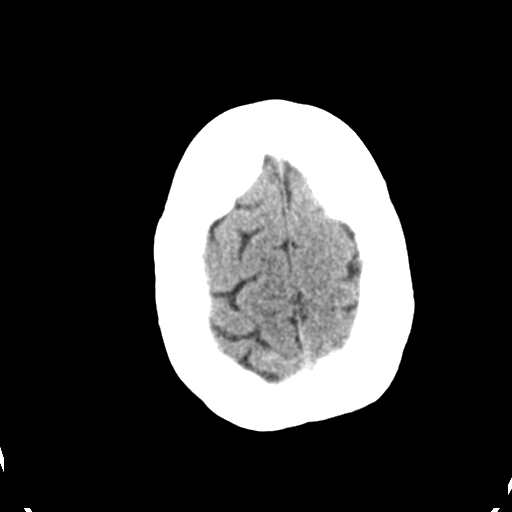
[im 28/34  brain]
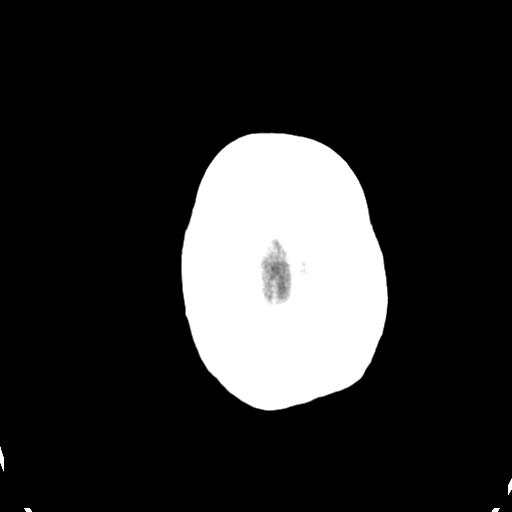
[im 28/34  bone]
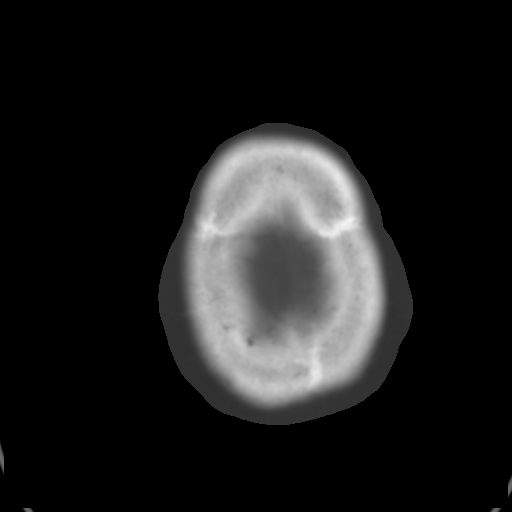
[im 31/34  brain]
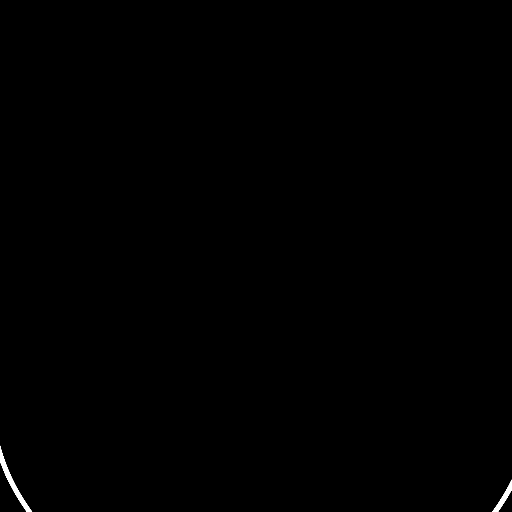

[Series 4: head 3.0 mpr · coronal · 0.35mm/px · 3 of 67 slices shown (1 of 2)]
[im 23/67  brain]
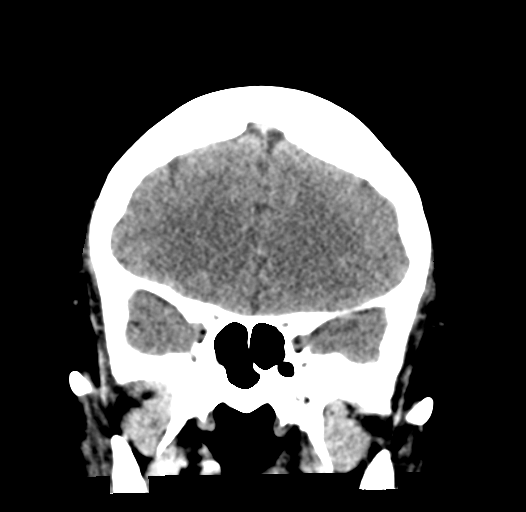
[im 30/67  brain]
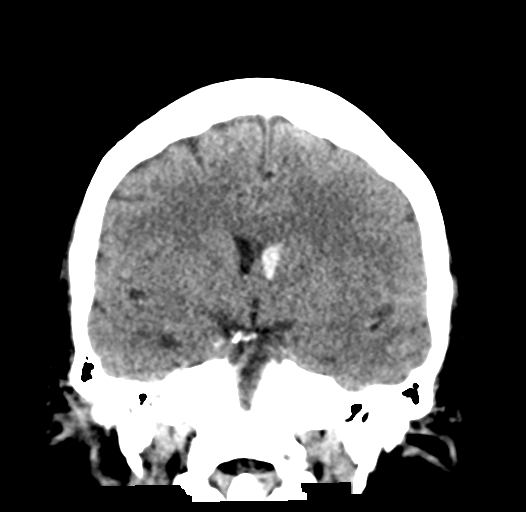
[im 37/67  brain]
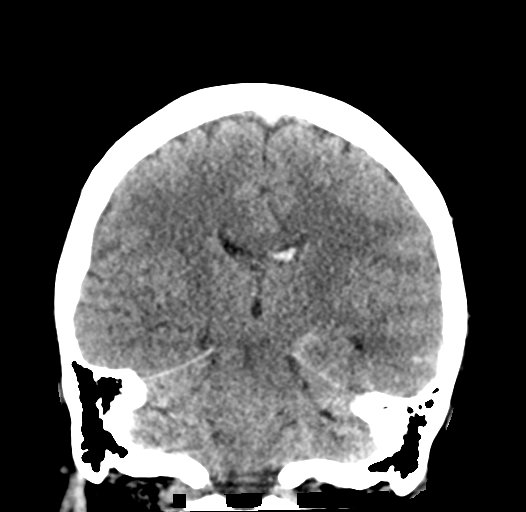

[Series 5: head 3.0 mpr · sagittal · 0.33mm/px · 3 of 66 slices shown (2 of 2)]
[im 22/66  brain]
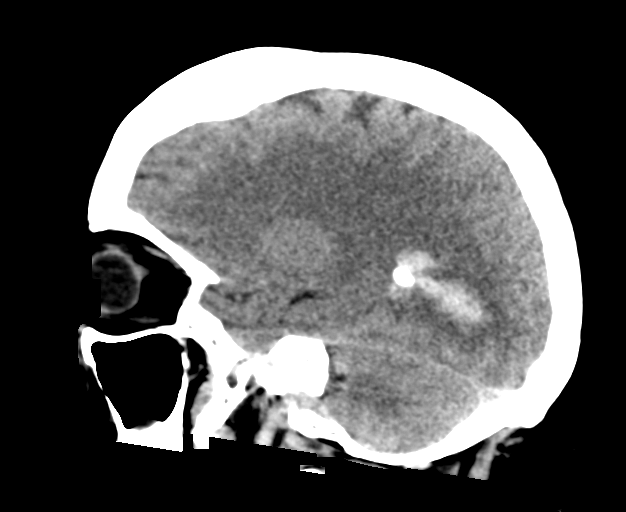
[im 33/66  brain]
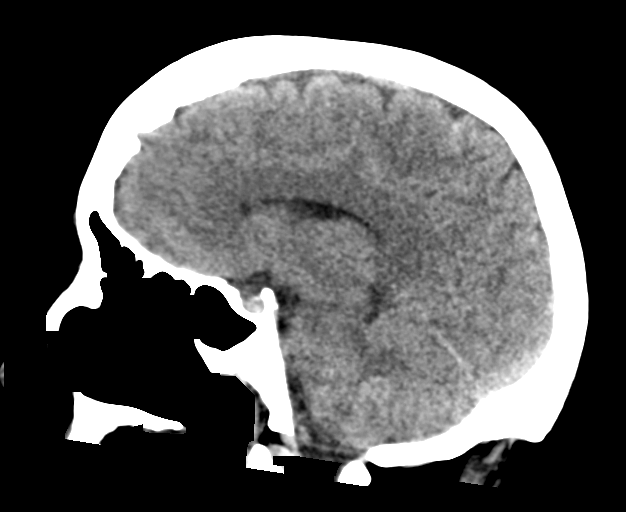
[im 44/66  brain]
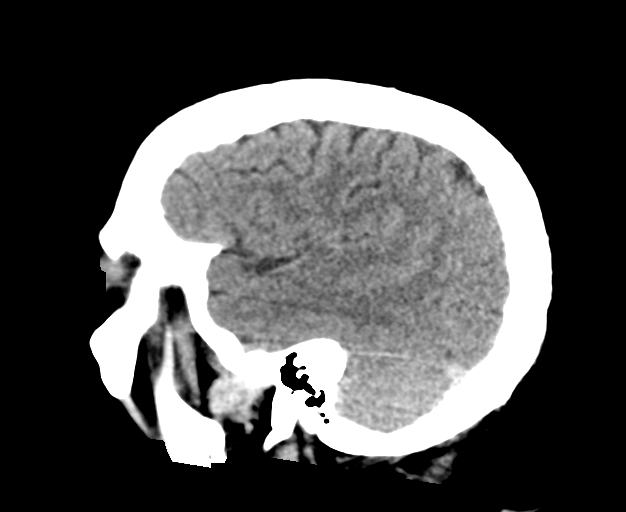

[16 of 47 positions shown; findings below may reference images not displayed]

FINDINGS: A 2.8 x 4.1 cm left parietal intraparenchymal hemorrhage is
unchanged with intraventricular extension into the left lateral
ventricle. 2 mm left to right midline shift is again noted. Some
effacement of the left basilar cisterns again noted.

There is no evidence of hydrocephalus, new hemorrhage or other
significant change.
IMPRESSION: No significant change since the prior study with 2.8 x 4.1 cm left
parietal intraparenchymal hemorrhage with intraventricular extension
and 2 mm left to right midline shift with some effacement of the
left basilar cisterns.

## 2018-05-19 IMAGING — CT CT HEAD W/O CM
3 series · 14 of 45 positions shown, 16 images · non-contrast
Comparison: CT scan of July 20, 2015.

CLINICAL DATA: Altered mental status.

EXAM:
CT HEAD WITHOUT CONTRAST
TECHNIQUE: Contiguous axial images were obtained from the base of the skull
through the vertex without intravenous contrast.

[Series 2: ax head wo · axial · 0.38mm/px · z∈[-70,+31]mm · 8 of 28 slices shown, 10 images]
[im 3/28  brain]
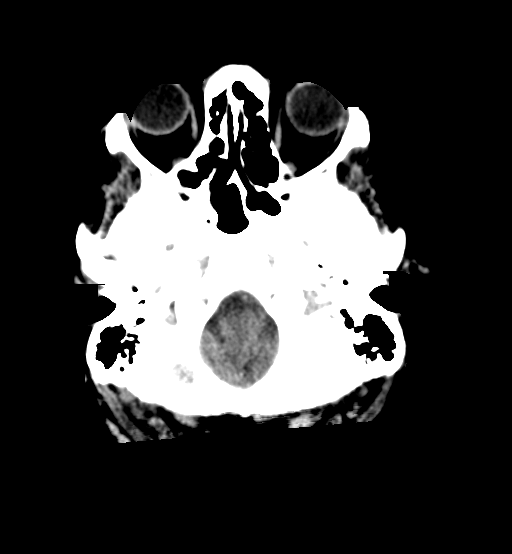
[im 3/28  bone]
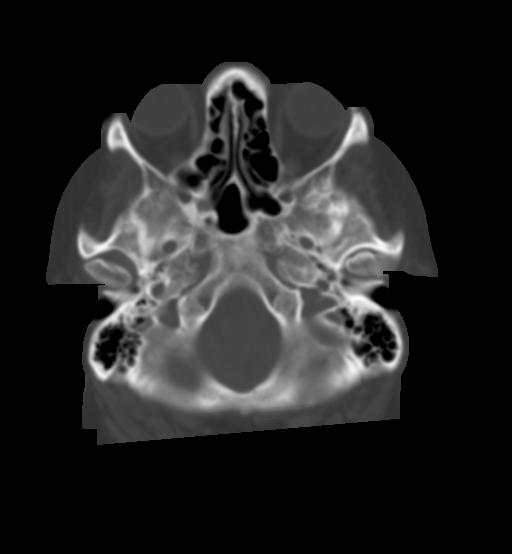
[im 6/28  brain]
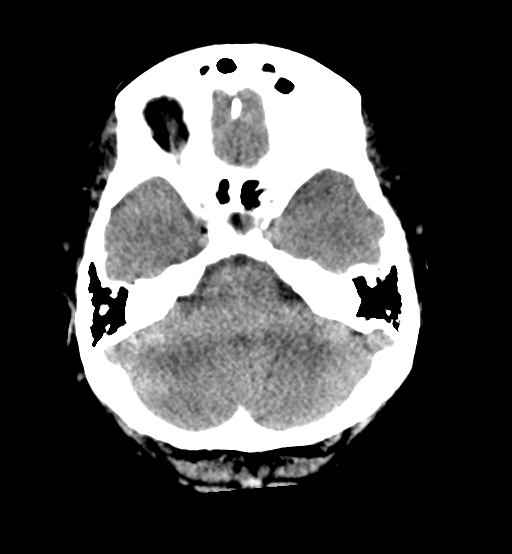
[im 10/28  brain]
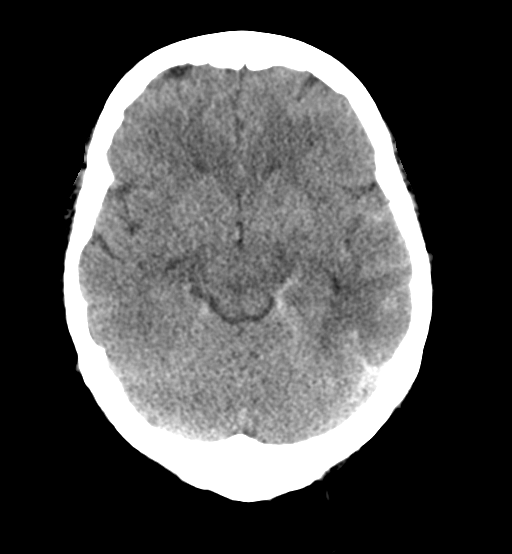
[im 13/28  brain]
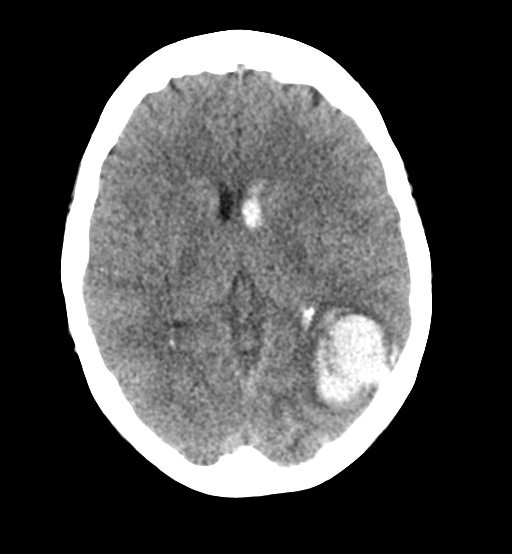
[im 16/28  brain]
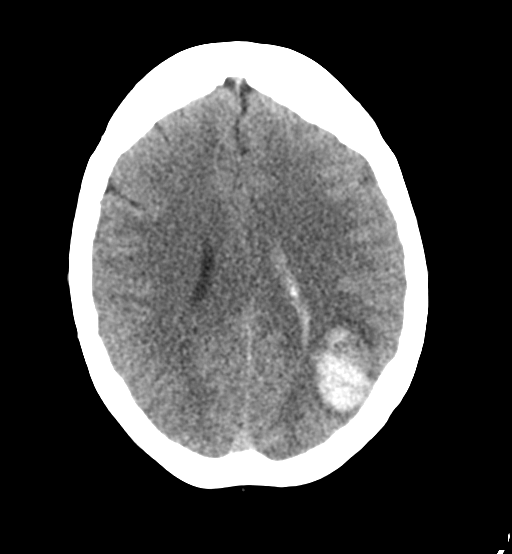
[im 16/28  bone]
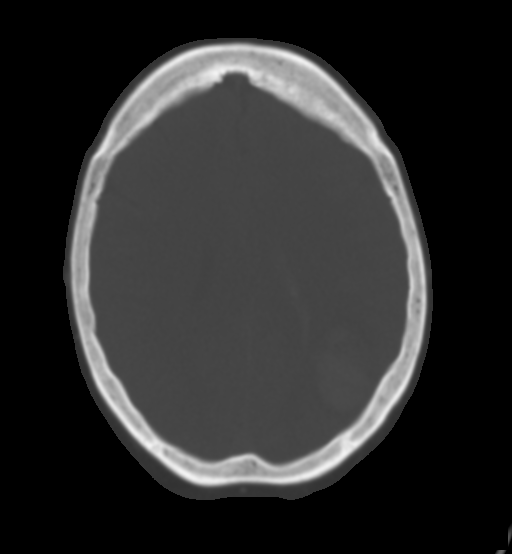
[im 19/28  brain]
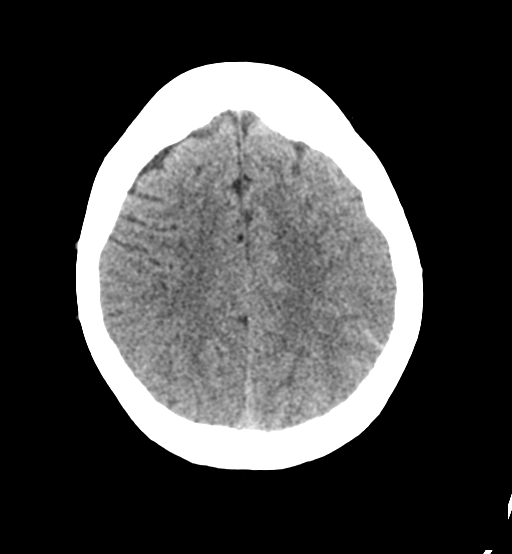
[im 23/28  brain]
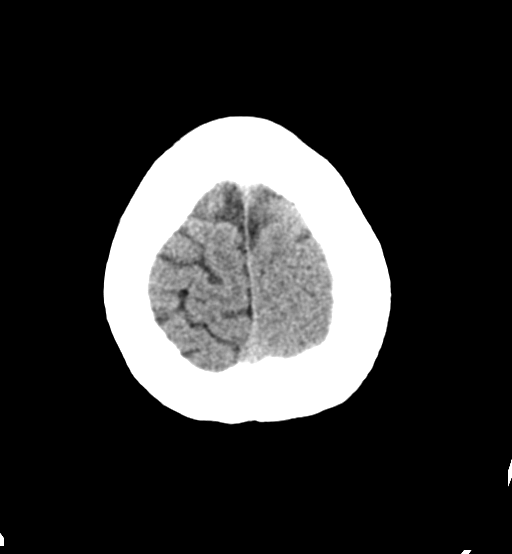
[im 26/28  brain]
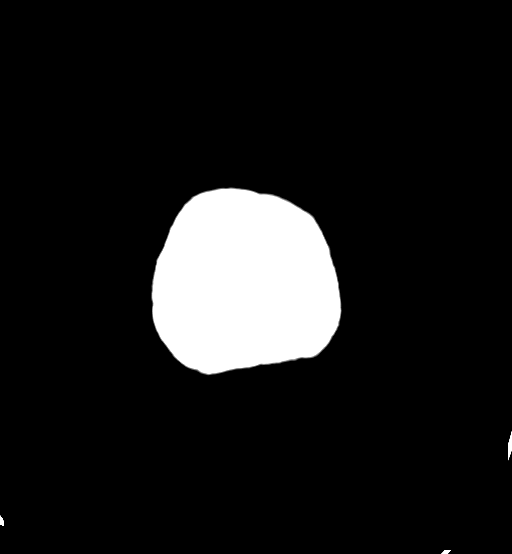

[Series 5: coronal soft tissue · coronal · 0.28mm/px · 3 of 67 slices shown]
[im 25/67  brain]
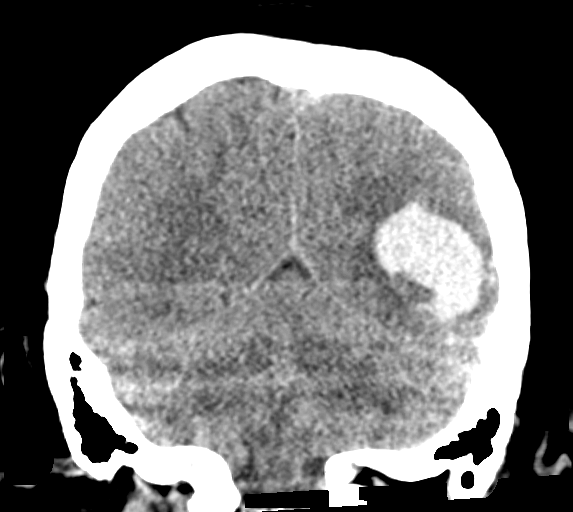
[im 31/67  brain]
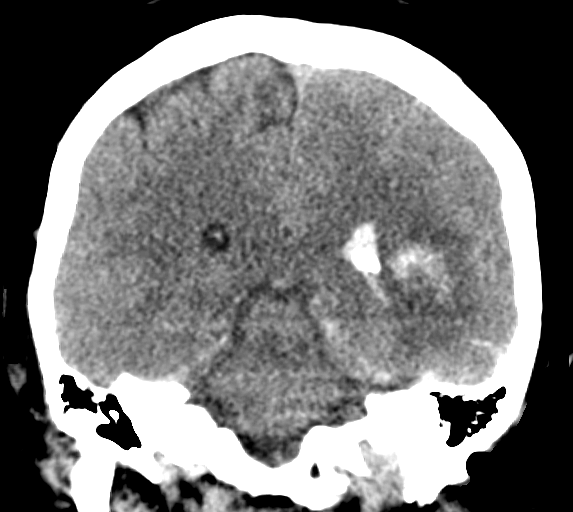
[im 36/67  brain]
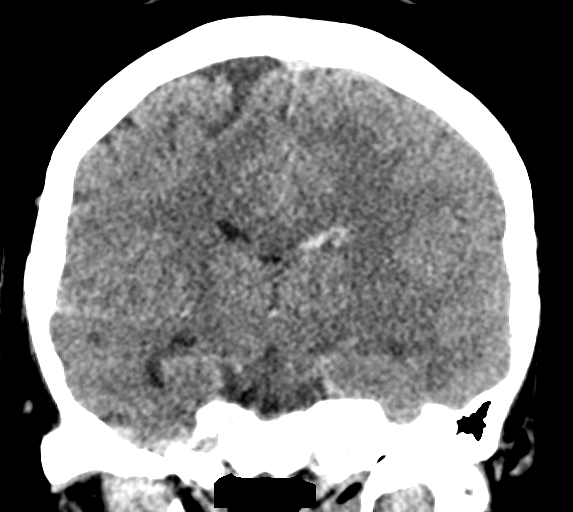

[Series 6: sagittal soft tissue · sagittal · 0.28mm/px · 3 of 51 slices shown]
[im 17/51  brain]
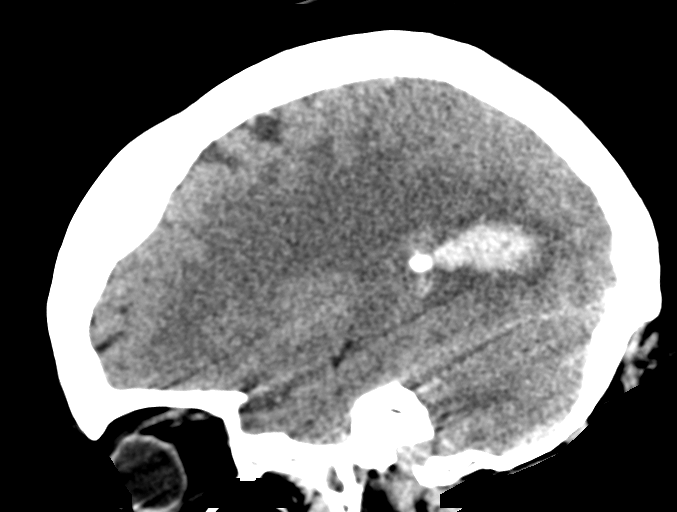
[im 26/51  brain]
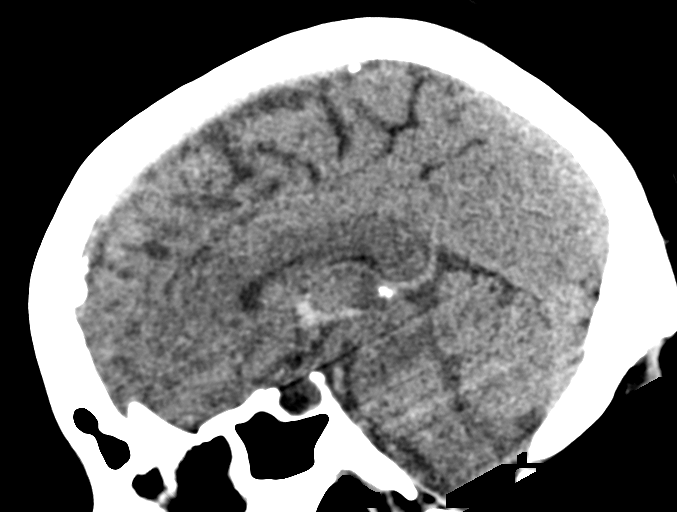
[im 34/51  brain]
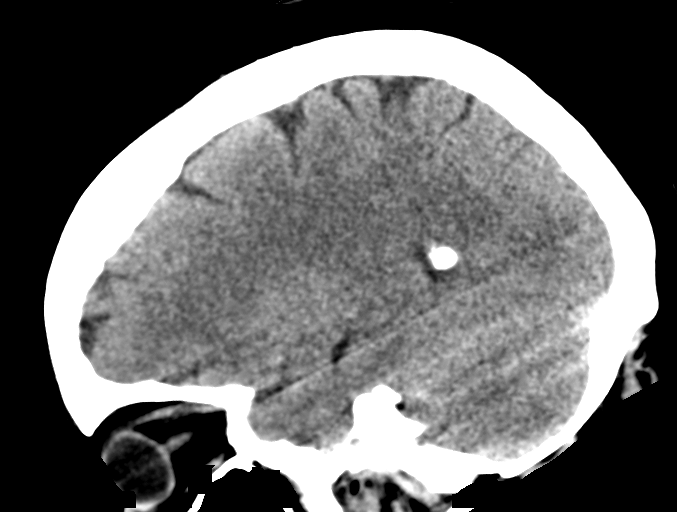

[14 of 45 positions shown; findings below may reference images not displayed]

FINDINGS: Bony calvarium appears intact. Large intraparenchymal hemorrhage is
seen in left posterior parietal region measuring 40 x 29 mm.
Intraventricular hemorrhage is noted in the left lateral ventricle
and a small amount is noted in third ventricle. Small amount of
subarachnoid hemorrhage is noted in the left temporal and posterior
parietal regions as well. No ventricular enlargement is noted. 4 mm
of left-to-right midline shift is noted. Mild bilateral ethmoid
sinusitis is noted.
IMPRESSION: Large intraparenchymal hemorrhage is noted in left posterior
parietal cortex with extension into the left lateral and third
ventricles. Small amount of left-sided subarachnoid hemorrhage is
noted. 4 mm of left-to-right midline shift is noted. Critical
Value/emergent results were called by telephone at the time of
interpretation on 04/18/2016 at [DATE] to Dr. ELKE BERROA , who
verbally acknowledged these results.

## 2018-05-25 ENCOUNTER — Emergency Department (HOSPITAL_COMMUNITY)
Admission: EM | Admit: 2018-05-25 | Discharge: 2018-05-26 | Disposition: A | Discharge status: Home / Self Care | Payer: No Typology Code available for payment source | Attending: Emergency Medicine | Admitting: Emergency Medicine

## 2018-05-25 ENCOUNTER — Emergency Department (HOSPITAL_COMMUNITY)
Admission: EM | Admit: 2018-05-25 | Discharge: 2018-05-25 | Disposition: A | Discharge status: Home / Self Care | Payer: PRIVATE HEALTH INSURANCE

## 2018-05-25 ENCOUNTER — Other Ambulatory Visit: Payer: Self-pay

## 2018-05-25 ENCOUNTER — Encounter (HOSPITAL_COMMUNITY): Payer: Self-pay | Admitting: Emergency Medicine

## 2018-05-25 DIAGNOSIS — E119 Type 2 diabetes mellitus without complications: Secondary | ICD-10-CM | POA: Insufficient documentation

## 2018-05-25 DIAGNOSIS — R1084 Generalized abdominal pain: Secondary | ICD-10-CM | POA: Insufficient documentation

## 2018-05-25 DIAGNOSIS — Z7984 Long term (current) use of oral hypoglycemic drugs: Secondary | ICD-10-CM | POA: Insufficient documentation

## 2018-05-25 DIAGNOSIS — G4489 Other headache syndrome: Secondary | ICD-10-CM | POA: Diagnosis not present

## 2018-05-25 DIAGNOSIS — Z79899 Other long term (current) drug therapy: Secondary | ICD-10-CM | POA: Insufficient documentation

## 2018-05-25 DIAGNOSIS — I10 Essential (primary) hypertension: Secondary | ICD-10-CM | POA: Diagnosis not present

## 2018-05-25 DIAGNOSIS — F17210 Nicotine dependence, cigarettes, uncomplicated: Secondary | ICD-10-CM | POA: Insufficient documentation

## 2018-05-25 DIAGNOSIS — Z8673 Personal history of transient ischemic attack (TIA), and cerebral infarction without residual deficits: Secondary | ICD-10-CM | POA: Diagnosis not present

## 2018-05-25 DIAGNOSIS — Z7902 Long term (current) use of antithrombotics/antiplatelets: Secondary | ICD-10-CM | POA: Diagnosis not present

## 2018-05-25 DIAGNOSIS — R51 Headache: Secondary | ICD-10-CM | POA: Diagnosis present

## 2018-05-25 LAB — CBC
HEMATOCRIT: 41.3 % (ref 36.0–46.0)
HEMOGLOBIN: 13.4 g/dL (ref 12.0–15.0)
MCH: 30.9 pg (ref 26.0–34.0)
MCHC: 32.4 g/dL (ref 30.0–36.0)
MCV: 95.4 fL (ref 78.0–100.0)
Platelets: 195 10*3/uL (ref 150–400)
RBC: 4.33 MIL/uL (ref 3.87–5.11)
RDW: 13.5 % (ref 11.5–15.5)
WBC: 9.1 10*3/uL (ref 4.0–10.5)

## 2018-05-25 LAB — URINALYSIS, ROUTINE W REFLEX MICROSCOPIC
Bilirubin Urine: NEGATIVE
Ketones, ur: NEGATIVE mg/dL
NITRITE: NEGATIVE
Protein, ur: NEGATIVE mg/dL
SPECIFIC GRAVITY, URINE: 1.012 (ref 1.005–1.030)
pH: 5 (ref 5.0–8.0)

## 2018-05-25 LAB — COMPREHENSIVE METABOLIC PANEL
ALBUMIN: 3.7 g/dL (ref 3.5–5.0)
ALT: 16 U/L (ref 0–44)
AST: 18 U/L (ref 15–41)
Alkaline Phosphatase: 101 U/L (ref 38–126)
Anion gap: 9 (ref 5–15)
BILIRUBIN TOTAL: 0.3 mg/dL (ref 0.3–1.2)
BUN: 6 mg/dL (ref 6–20)
CHLORIDE: 109 mmol/L (ref 98–111)
CO2: 23 mmol/L (ref 22–32)
Calcium: 9.1 mg/dL (ref 8.9–10.3)
Creatinine, Ser: 0.79 mg/dL (ref 0.44–1.00)
GFR calc Af Amer: >60 mL/min (ref >60)
GLUCOSE: 111 mg/dL — AB (ref 70–99)
POTASSIUM: 3.5 mmol/L (ref 3.5–5.1)
Sodium: 141 mmol/L (ref 135–145)
TOTAL PROTEIN: 6.7 g/dL (ref 6.5–8.1)

## 2018-05-25 LAB — LIPASE, BLOOD: LIPASE: 28 U/L (ref 11–51)

## 2018-05-25 IMAGING — CT CT HEAD W/O CM
3 of 4 series · 15 of 47 positions shown, 18 images · non-contrast
Comparison: MRI 04/19/2016, CT 04/18/2016

ADDENDUM:
These results were called by telephone at the time of interpretation
on 04/24/2016 at [DATE] to Dr. Sing , who verbally
acknowledged these results.
CLINICAL DATA: Intra cerebral hemorrhage

EXAM:
CT HEAD WITHOUT CONTRAST
TECHNIQUE: Contiguous axial images were obtained from the base of the skull
through the vertex without intravenous contrast.

[Series 3: head 2.0 h70h · axial · 0.47mm/px · z∈[-160,-22]mm · 9 of 87 slices shown, 12 images]
[im 9/87  brain]
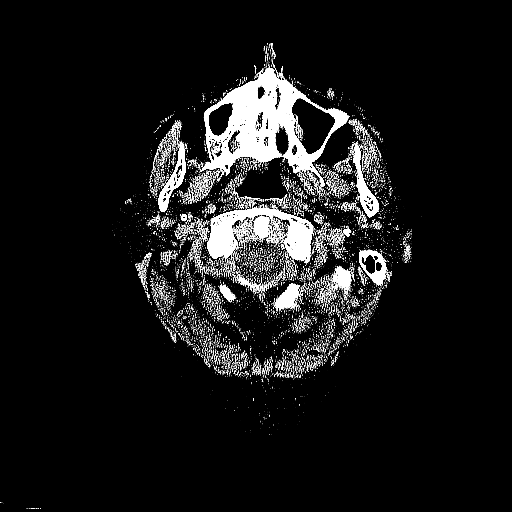
[im 9/87  bone]
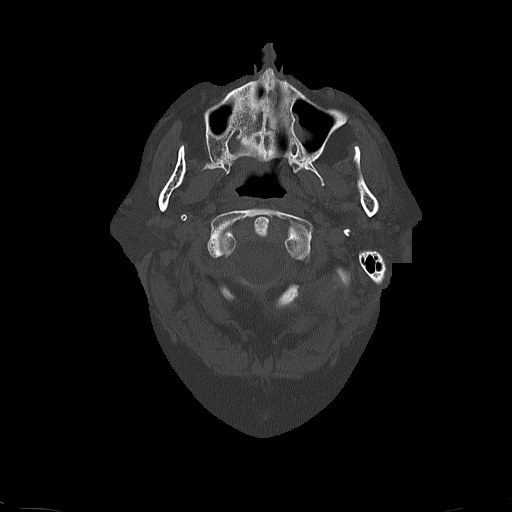
[im 18/87  brain]
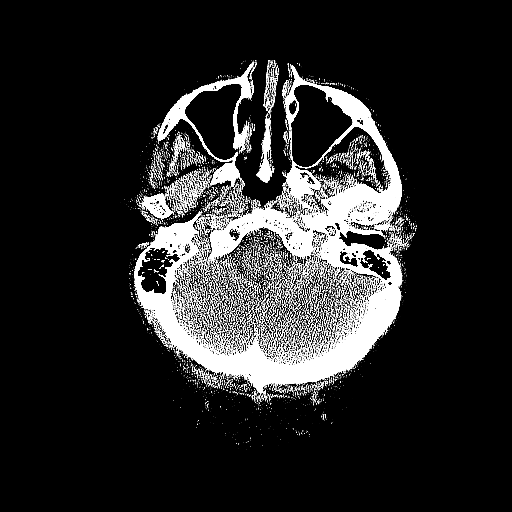
[im 26/87  brain]
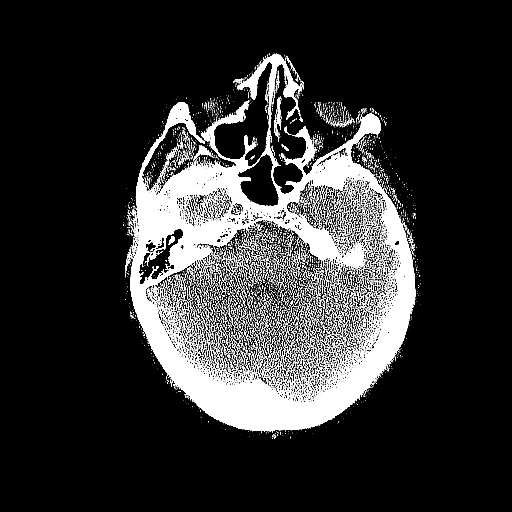
[im 35/87  brain]
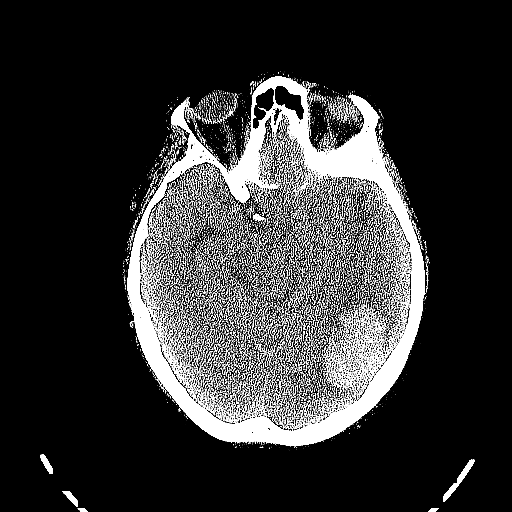
[im 44/87  brain]
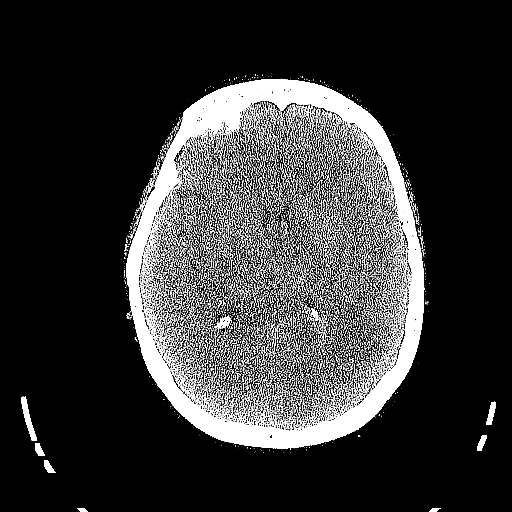
[im 44/87  bone]
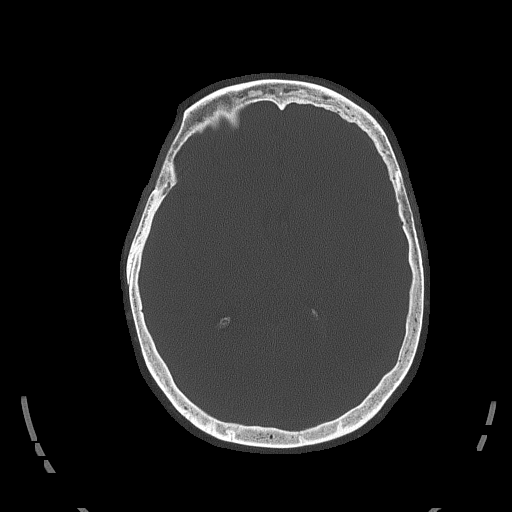
[im 52/87  brain]
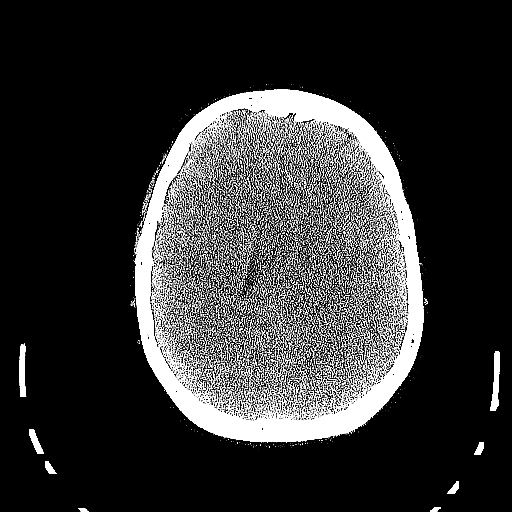
[im 61/87  brain]
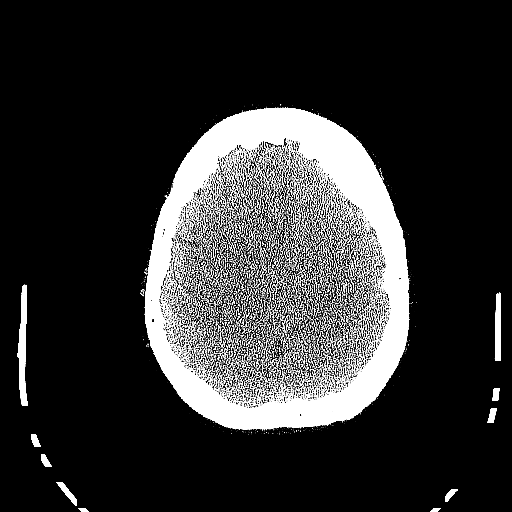
[im 69/87  brain]
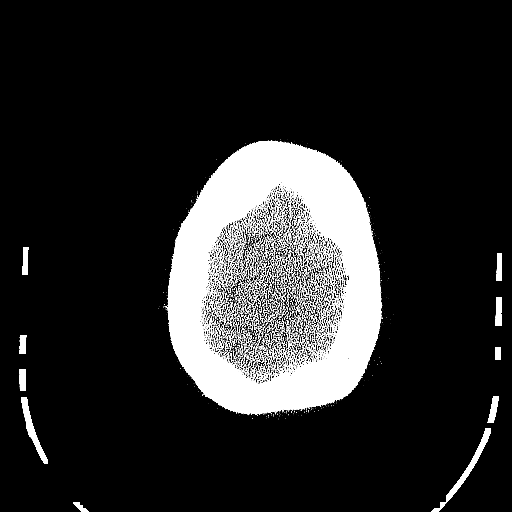
[im 78/87  brain]
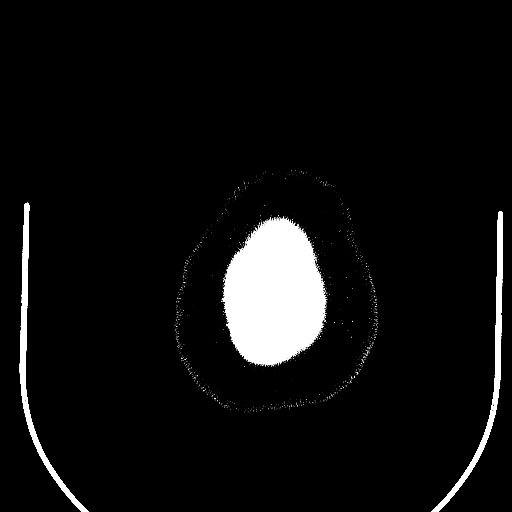
[im 78/87  bone]
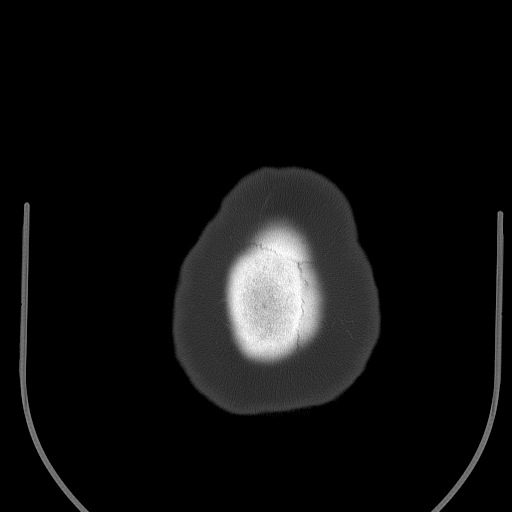

[Series 4: head 3.0 mpr · coronal · 0.34mm/px · 3 of 71 slices shown (1 of 2)]
[im 24/71  brain]
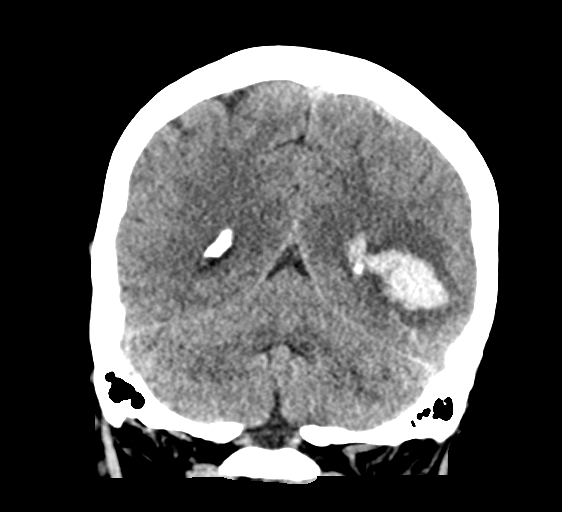
[im 32/71  brain]
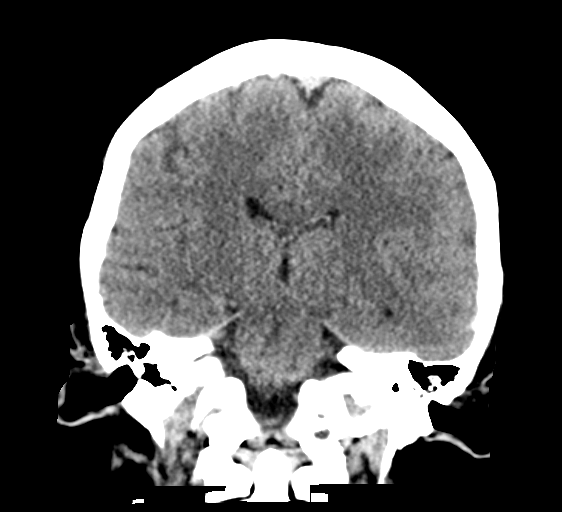
[im 39/71  brain]
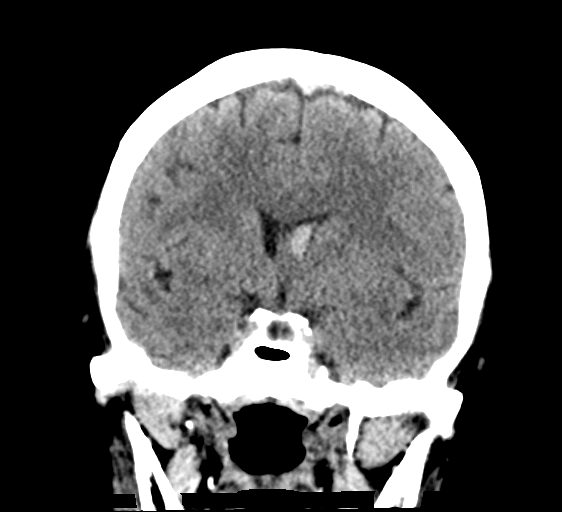

[Series 5: head 3.0 mpr · sagittal · 0.34mm/px · 3 of 66 slices shown (2 of 2)]
[im 22/66  brain]
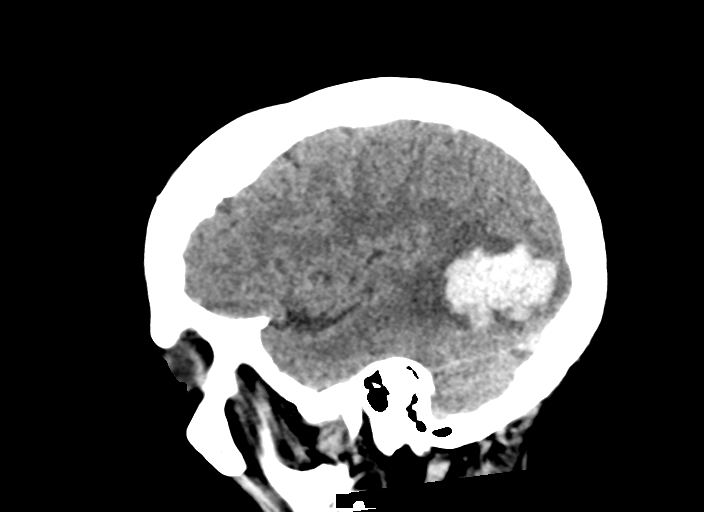
[im 33/66  brain]
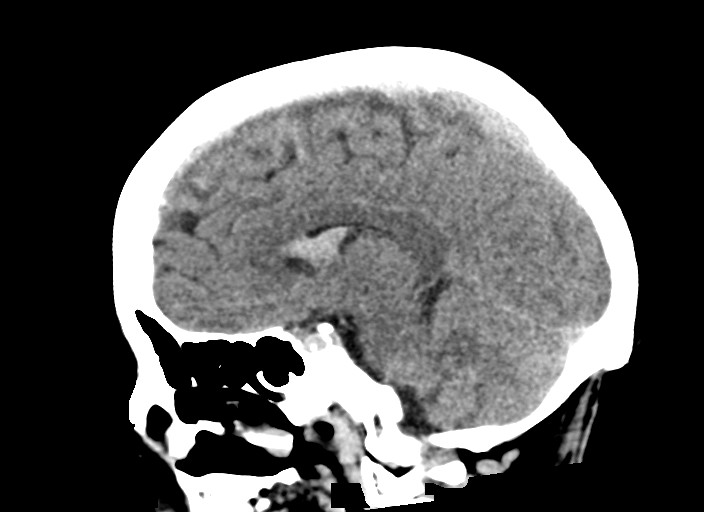
[im 44/66  brain]
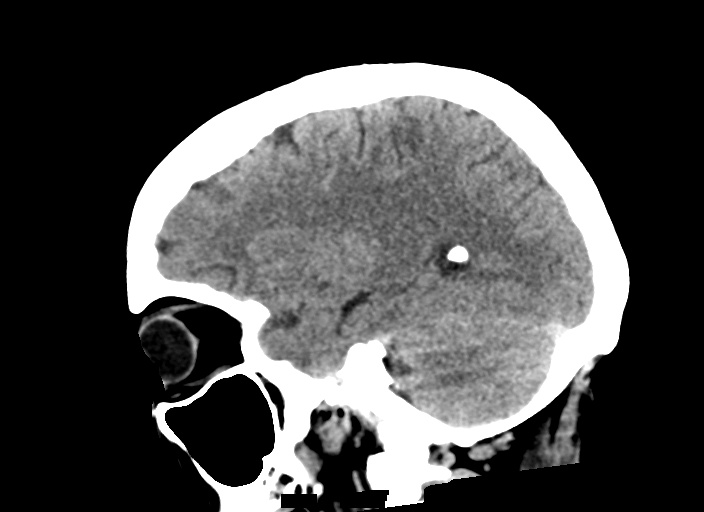

[15 of 47 positions shown; findings below may reference images not displayed]

FINDINGS: Brain: Left posterior temporal hemorrhage measures 4.1 x 2.7 cm and
is unchanged in size. Surrounding low-density also unchanged.
Intraventricular extension into the atrium of the left lateral
ventricle. Decreased ventricular hemorrhage. No hydrocephalus. Mild
shift of the midline structures to the right 3 mm.

Possible partial thrombosis of the left transverse sinus leading to
intra cerebral hemorrhage. Low-density is seen within the left
transverse sinus best seen on coronal images. Recommend CT venogram
to confirm.

Negative for acute ischemic infarct.  No mass lesion identified.

Vascular: Possible partial thrombosis of left transverse sinus.

Skull: Negative

Sinuses/Orbits: Mild mucosal edema paranasal sinuses without
air-fluid level.

Other: Normal soft tissues
IMPRESSION: Left posterior temporal hemorrhage unchanged. There is possible
thrombosis in the left transverse sinus as the cause of the
hemorrhage. CT venogram recommended for confirmation.

## 2018-05-25 IMAGING — MR MR MRV HEAD W/O CM
2 series · 19 of 48 positions shown · non-contrast
Comparison: Multiple priors.  Most recent CT earlier today.

CLINICAL DATA: Intracranial hemorrhage. Evaluate for sinus
thrombosis.

EXAM:
MR MRV HEAD WITHOUT CONTRAST
TECHNIQUE: Multiplanar, multisequence MR imaging was performed. No intravenous
contrast was administered.

[Series 4: MRV · coronal · 1.5mm · 0.45mm/px · 9 of 111 slices shown]
[im 1/111]
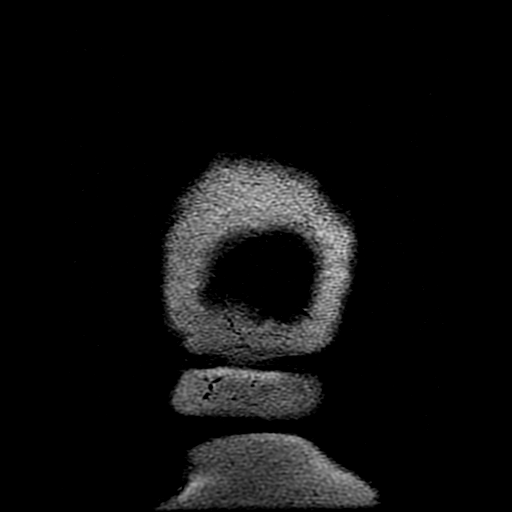
[im 19/111]
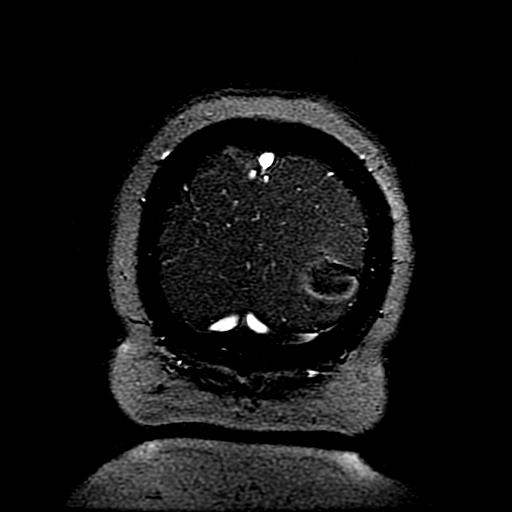
[im 37/111]
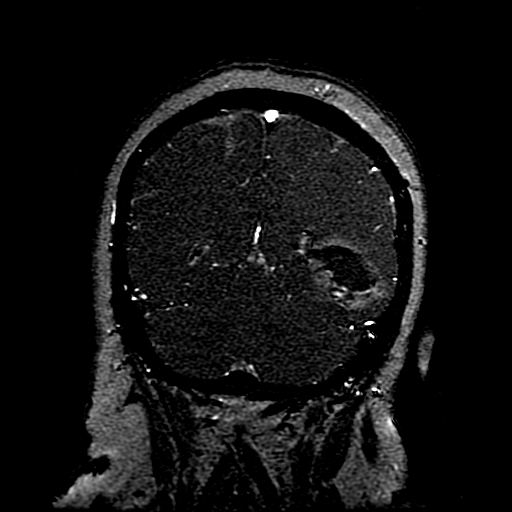
[im 46/111]
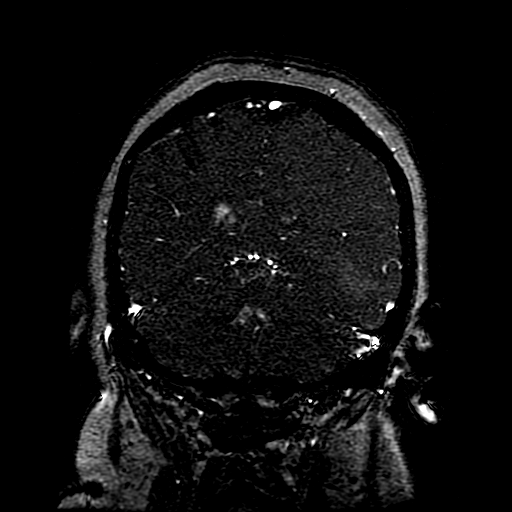
[im 56/111]
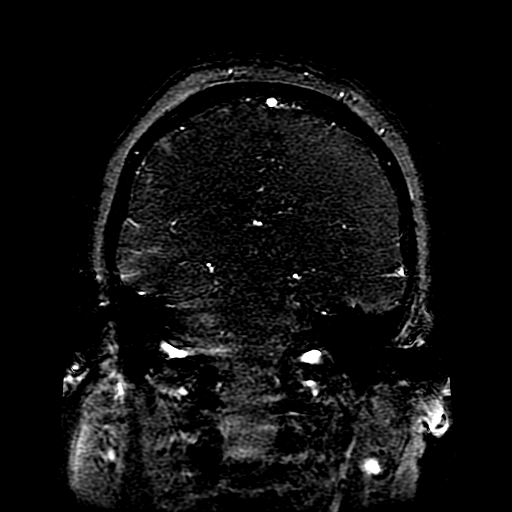
[im 65/111]
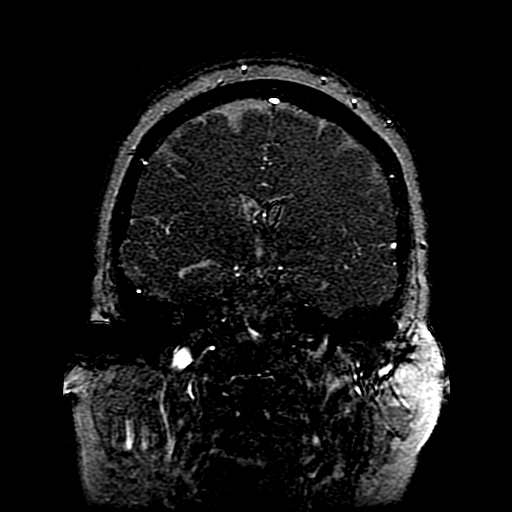
[im 74/111]
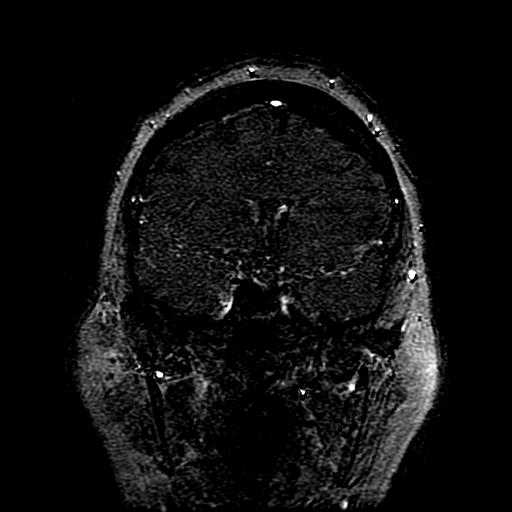
[im 92/111]
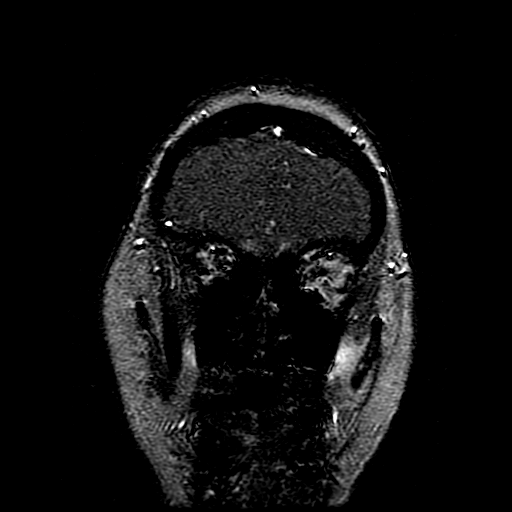
[im 111/111]
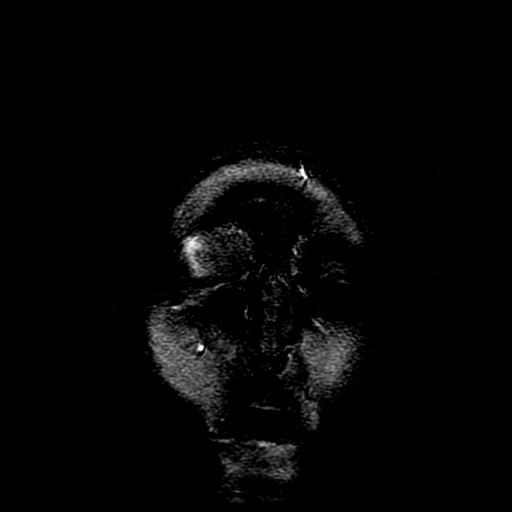

[Series 6: sag inhance (id) · sagittal · 1.8mm · 0.47mm/px · 10 of 296 slices shown]
[im 18/296]
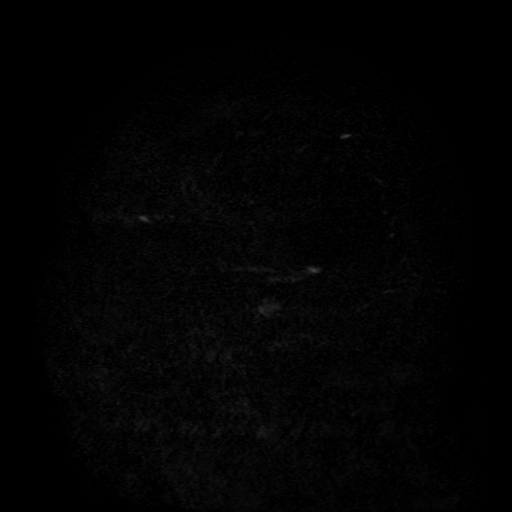
[im 44/296]
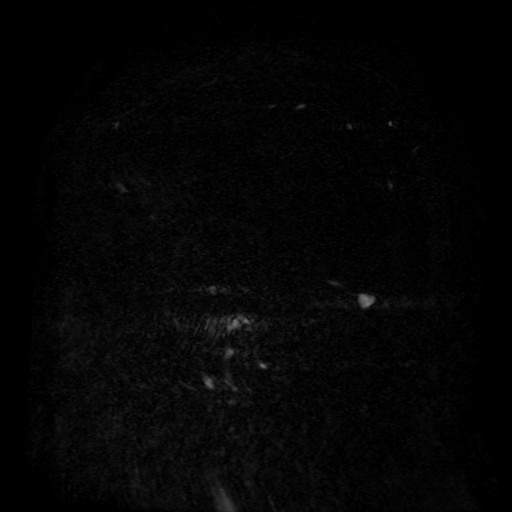
[im 53/296]
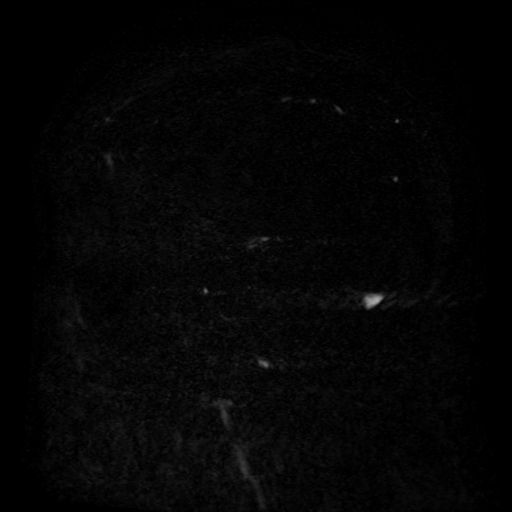
[im 87/296]
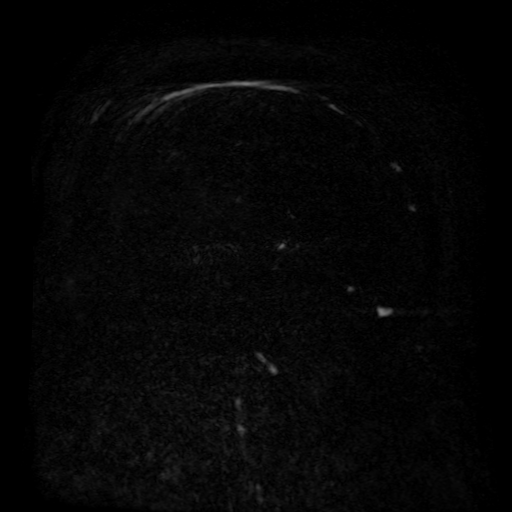
[im 131/296]
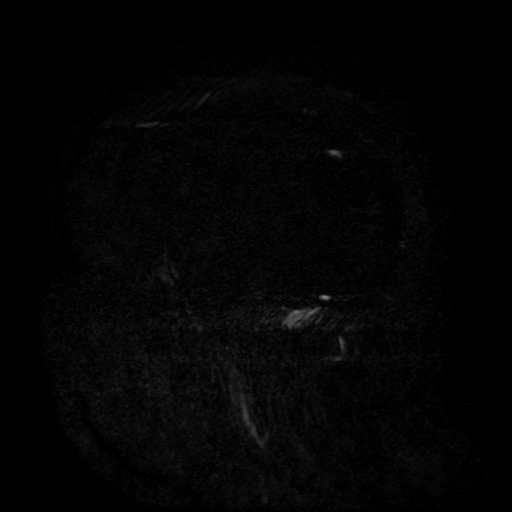
[im 148/296]
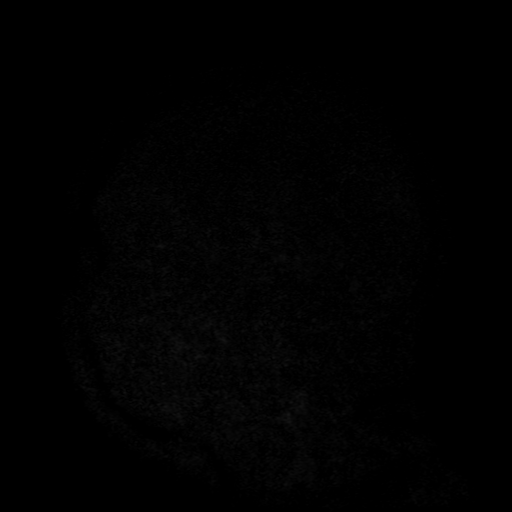
[im 165/296]
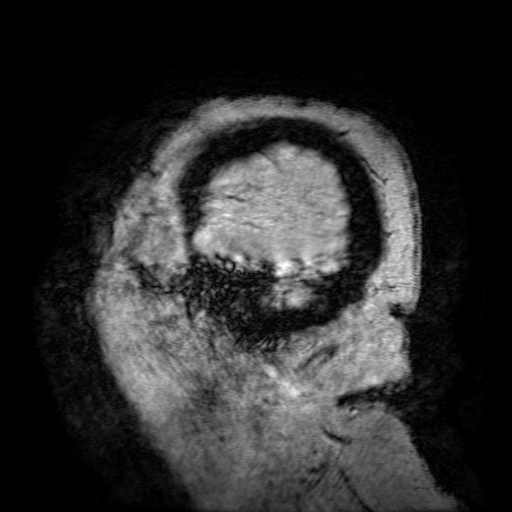
[im 209/296]
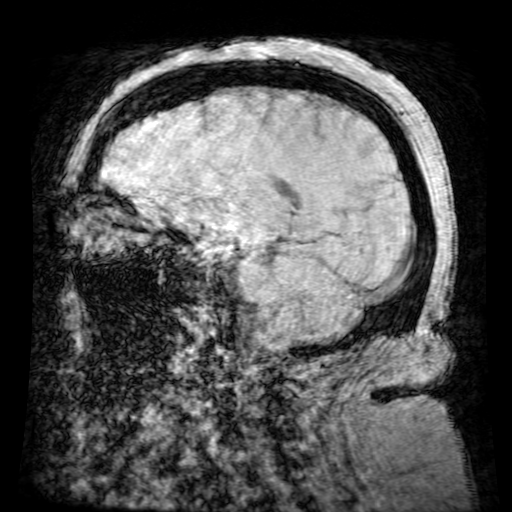
[im 243/296]
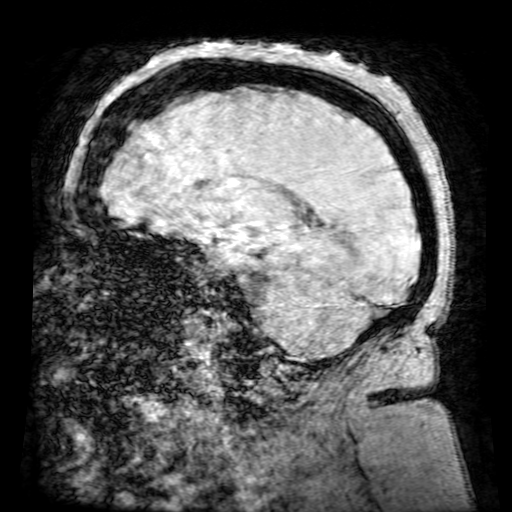
[im 252/296]
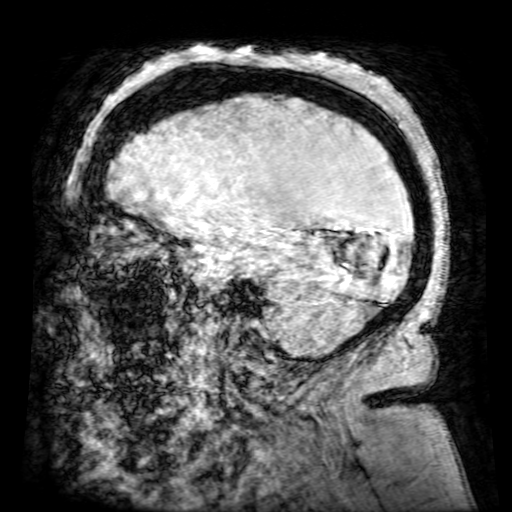

[19 of 48 positions shown; findings below may reference images not displayed]

FINDINGS: The superior sagittal sinus is widely patent. Both transverse and
sigmoid sinuses are patent. Pacchionian granulations are present in
the RIGHT transverse sinus, nonocclusive.

At the junction of the LEFT transverse and sigmoid sinus, there is a
filling defect. This correlates with abnormal signal on the previous
MRI brain from 04/19/2016 and suspected abnormal filling defect from
the CT earlier today. Concern raised for acute LEFT nonocclusive
venous thrombosis.

On 2D images, the LEFT vein of [place] appears patent. No other areas
of concern for cortical venous thrombosis are definitely seen.
IMPRESSION: Filling defect at the junction of the LEFT transverse and sigmoid
sinus consistent with nonocclusive LEFT dural venous sinus
thrombosis.

## 2018-05-25 NOTE — ED Triage Notes (Signed)
Pt c/o lower abd pain and migraine headache onset 3 days ago with nausea, vomiting and diarrhea.  Pt also c/o lower back pain.

## 2018-05-26 MED ORDER — DIPHENHYDRAMINE HCL 50 MG/ML IJ SOLN
50.0000 mg | Freq: Once | INTRAMUSCULAR | Status: AC
  Administered 2018-05-26: 50 mg via INTRAVENOUS
  Filled 2018-05-26: qty 1

## 2018-05-26 NOTE — ED Notes (Signed)
PT states understanding of care given, follow up care. PT ambulated from ED to car with a steady gait.  

## 2018-05-26 NOTE — Discharge Instructions (Addendum)

## 2018-05-26 NOTE — ED Provider Notes (Signed)
[facility] EMERGENCY DEPARTMENT Provider Note   CSN: [id number] Arrival date & time: 05/25/18  1934     History   Chief Complaint Chief Complaint  Patient presents with  . Abdominal Pain  . Migraine    HPI DALAYAH DEAHL is a 52 y.o. female.  The history is provided by the patient.  Migraine  This is a recurrent problem. The current episode started more than 2 days ago. The problem occurs daily. The problem has been gradually worsening. Associated symptoms include headaches. Exacerbated by: Sherlynn Stalls. Nothing relieves the symptoms.   Patient history of diabetes, chronic migraines, hypertension presents with abdominal pain, headache, vomiting, diarrhea. She reports she gets multiple migraine headaches per month, this is similar to prior.  No fever.  No focal weakness.  She does report vomiting and diarrhea.  She also reports generalized abdominal pain. She denies dysuria or urinary frequency  She reports she recently fell going down 2 steps, landed on her low back.  No head injury, no LOC.  Patient requesting narcotics soon as by history and physical were completed Past Medical History:  Diagnosis Date  . Diabetes mellitus without complication (HCC)   . Difficult intubation   . Diverticulitis   . Headache   . Hepatitis C    treated and resolved  . History of kidney stones   . Hypertension   . IBS (irritable bowel syndrome)   . Kidney stones   . Sciatica   . Sleep apnea    on cpap  . Stroke [facility]) 2017   memory loss    Patient Active Problem List   Diagnosis Date Noted  . Shoulder pain, right 12/22/2017  . UTI (urinary tract infection) 08/21/2017  . Nausea & vomiting 08/21/2017  . Nicotine dependence 08/21/2017  . Headache 01/07/2017  . Small bowel obstruction (HCC) 09/02/2016  . Pain and swelling of left upper extremity 07/22/2016  . Superficial venous thrombosis of arm, left 07/22/2016  . ICH (intracerebral hemorrhage) (HCC)   . Essential  hypertension, malignant 04/19/2016  . Cytotoxic brain edema (HCC) 04/19/2016  . IVH (intraventricular hemorrhage) (HCC) 04/18/2016  . Hepatitis C     Past Surgical History:  Procedure Laterality Date  . ABDOMINAL HYSTERECTOMY    . carpel tunnel syndrome  2017  . HARDWARE REMOVAL Right 12/22/2017   Procedure: HARDWARE REMOVAL-RIGHT ARM;  Surgeon: Lyndle Herrlich, MD;  Location: [facility] ORS;  Service: Orthopedics;  Laterality: Right;  Removal of implant, superficial right humerus  . HUMERUS IM NAIL Right 03/11/2017   Procedure: INTRAMEDULLARY (IM) NAIL HUMERAL;  Surgeon: Lyndle Herrlich, MD;  Location: [facility] ORS;  Service: Orthopedics;  Laterality: Right;  . KIDNEY STONE SURGERY Right 02/12/12  . KIDNEY STONE SURGERY Left 07/15/2012  . LIGATION OF ARTERIOVENOUS  FISTULA Left 06/21/2016   Procedure: LIGATION OF ARTERIOVENOUS  FISTULA ( LIGATION BASILIC VEIN );  Surgeon: Renford Dills, MD;  Location: [facility] ORS;  Service: Vascular;  Laterality: Left;  . LITHOTRIPSY    . OOPHORECTOMY    . SINUS EXPLORATION    . TONSILLECTOMY       OB History   None      Home Medications    Prior to Admission medications   Medication Sig Start Date End Date Taking? Authorizing Provider  alprazolam Prudy Feeler) 2 MG tablet Take 1 mg by mouth 2 (two) times daily.     [provider]  atorvastatin (LIPITOR) 40 MG tablet Take 40 mg by mouth every evening.  [provider]  clopidogrel (PLAVIX) 75 MG tablet Take 1 tablet (75 mg total) by mouth daily. 06/27/16   Marvel PlanXu, Jindong, MD  diltiazem (DILACOR XR) 180 MG 24 hr capsule Take 180 mg by mouth daily.    [provider]  FLUoxetine (PROZAC) 20 MG tablet Take 20 mg by mouth daily.    [provider]  glipiZIDE (GLUCOTROL) 5 MG tablet Take 1 tablet (5 mg total) by mouth 2 (two) times daily before a meal. 04/30/16   Lora PaulaKrall, Jennifer T, MD  levETIRAcetam (KEPPRA) 500 MG tablet Take 500 mg by mouth 2 (two) times daily. 02/16/18   [provider]  losartan (COZAAR) 100 MG tablet Take 100 mg by mouth daily.  08/08/17   [provider]  nitrofurantoin, macrocrystal-monohydrate, (MACROBID) 100 MG capsule Take 1 capsule (100 mg total) by mouth 2 (two) times daily. 05/02/18   Rise MuLeaphart, Kenneth T, PA-C  omeprazole (PRILOSEC) 40 MG capsule Take 40 mg by mouth daily.    [provider]  pregabalin (LYRICA) 50 MG capsule Take 50 mg by mouth at bedtime. 04/30/18   [provider]  promethazine (PHENERGAN) 25 MG tablet Take 25 mg by mouth every 6 (six) hours as needed for nausea or vomiting.  08/08/17   [provider]  zolpidem (AMBIEN) 10 MG tablet Take 10 mg by mouth at bedtime.  08/14/17   [provider]    Family History Family History  Problem Relation Age of Onset  . Heart failure Mother   . Diabetes Father   . Cancer Sister     Social History Social History   Tobacco Use  . Smoking status: Current Every Day Smoker    Packs/day: 1.00    Years: 1.50    Pack years: 1.50    Types: Cigarettes  . Smokeless tobacco: Never Used  Substance Use Topics  . Alcohol use: No    Comment: no alcohol since 2002  . Drug use: No    Comment: electric cig     Allergies   Gabapentin; Tylenol [acetaminophen]; Aspirin; Buprenorphine hcl; Codeine; Compazine; Ioxaglate; Ivp dye [iodinated diagnostic agents]; Metrizamide; Naproxen; Norco [hydrocodone-acetaminophen]; Penicillin g; Penicillins; Prochlorperazine maleate; Reglan [metoclopramide]; Sulfur; Tegretol [carbamazepine]; Toradol [ketorolac tromethamine]; Tramadol; and Zofran [ondansetron hcl]   Review of Systems Review of Systems  Gastrointestinal: Positive for diarrhea, nausea and vomiting.  Musculoskeletal: Positive for back pain.  Neurological: Positive for headaches. Negative for weakness.  All other systems reviewed and are negative.    Physical Exam Updated Vital Signs BP 108/71 (BP Location: Right Arm)   Pulse 67    Temp 98.1 F (36.7 C) (Oral)   Resp 16   Ht 1.575 m (5\' 2" )   Wt 86.2 kg   SpO2 100%   BMI 34.75 kg/m   Physical Exam CONSTITUTIONAL: Well developed/well nourished, no distress noted HEAD: Normocephalic/atraumatic EYES: EOMI/PERRL, no nystagmus, no ptosis ENMT: Mucous membranes moist NECK: supple no meningeal signs, no bruits SPINE/BACK:entire spine nontender, no bruising/crepitance/stepoffs noted to spine CV: S1/S2 noted, no murmurs/rubs/gallops noted LUNGS: Lungs are clear to auscultation bilaterally, no apparent distress ABDOMEN: soft, nontender, no rebound or guarding GU:no cva tenderness NEURO:Awake/alert, face symmetric, no arm or leg drift is noted Equal 5/5 strength with shoulder abduction, elbow flex/extension, wrist flex/extension in upper extremities and equal hand grips bilaterally Equal 5/5 strength with hip flexion,knee flex/extension, foot dorsi/plantar flexion Cranial nerves 3/4/5/6/03/17/09/11/12 tested and intact No past pointing Sensation to light touch intact in all extremities EXTREMITIES: pulses  normal, full ROM SKIN: warm, color normal PSYCH: no abnormalities of mood noted, alert and oriented to situation    ED Treatments / Results  Labs (all labs ordered are listed, but only abnormal results are displayed) Labs Reviewed  COMPREHENSIVE METABOLIC PANEL - Abnormal; Notable for the following components:      Result Value   Glucose, Bld 111 (*)    All other components within normal limits  URINALYSIS, ROUTINE W REFLEX MICROSCOPIC - Abnormal; Notable for the following components:   APPearance HAZY (*)    Glucose, UA >=500 (*)    Hgb urine dipstick SMALL (*)    Leukocytes, UA MODERATE (*)    Bacteria, UA RARE (*)    All other components within normal limits  URINE CULTURE  LIPASE, BLOOD  CBC    EKG None  Radiology No results found.  Procedures Procedures   Medications Ordered in ED Medications  diphenhydrAMINE (BENADRYL) injection 50 mg  (50 mg Intravenous Given 05/26/18 0446)     Initial Impression / Assessment and Plan / ED Course  I have reviewed the triage vital signs and the nursing notes.  Pertinent labs  results that were available during my care of the patient were reviewed by me and considered in my medical decision making (see chart for details).     Patient presents for multiple complaints, but she admits that her main concern is her headache. She reports she gets this several times per month, and this is similar to prior.  No focal neuro deficits are noted at this time.  I have low suspicion for stroke/meningitis/SAH at this time. No recent head trauma, no bruising or signs of trauma to head  Patient persistently asking for Dilaudid and morphine for her headaches. Unfortunately she has greater than 10 medication allergies including all medications that treat migraines. I advised her that narcotics are not recommended for her migraines.  This appears to be more of a chronic process.  I advised her to follow-up closely with her neurologist for better management of her headaches. Do not feel further work-up is required.  I advised patient that I would not be giving her narcotics at this time.  In terms of her urinalysis, she has frequent abnormal urinalysis, and she denies any dysuria or frequency. She does not feel she has UTI.  She refused to provide further samples for a urine culture.  Final Clinical Impressions(s) / ED Diagnoses   Final diagnoses:  Other headache syndrome    ED Discharge Orders    None       Zadie Rhine, MD 05/26/18 [phone]

## 2018-05-27 LAB — URINE CULTURE

## 2018-05-29 IMAGING — CT CT HEAD W/O CM
3 of 4 series · 17 of 47 positions shown, 20 images · non-contrast
Comparison: Intracranial MRV 04/24/2016 and earlier.

CLINICAL DATA: 50-year-old female with posterior left hemisphere
intra-axial hemorrhage suspected secondary to left venous/dural
sinus thrombus. Initial encounter.

EXAM:
CT HEAD WITHOUT CONTRAST
TECHNIQUE: Contiguous axial images were obtained from the base of the skull
through the vertex without intravenous contrast.

[Series 201: head w/o, idose (1) · axial · non-contrast · 0.41mm/px · z∈[+947,+1077]mm · 11 of 32 slices shown, 14 images]
[im 3/32  brain]
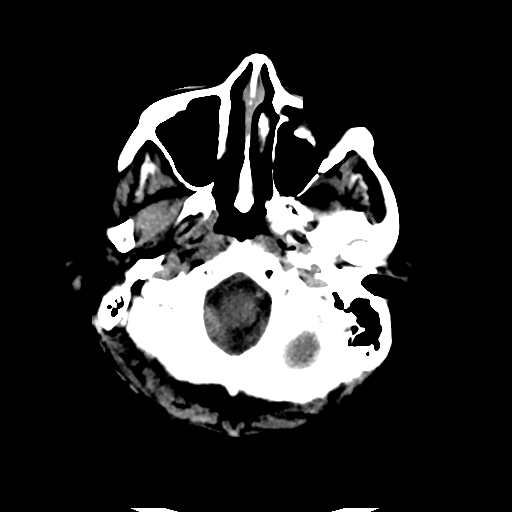
[im 3/32  bone]
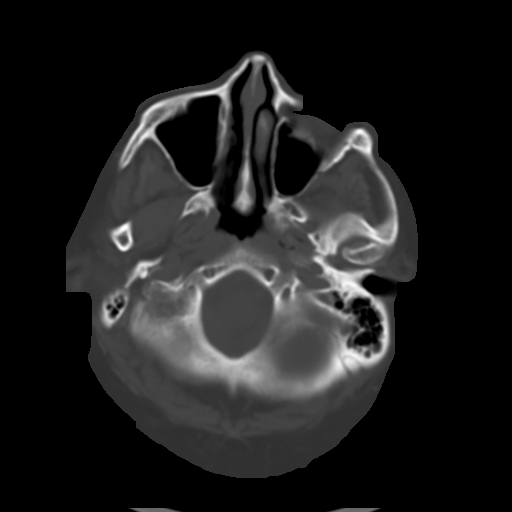
[im 5/32  brain]
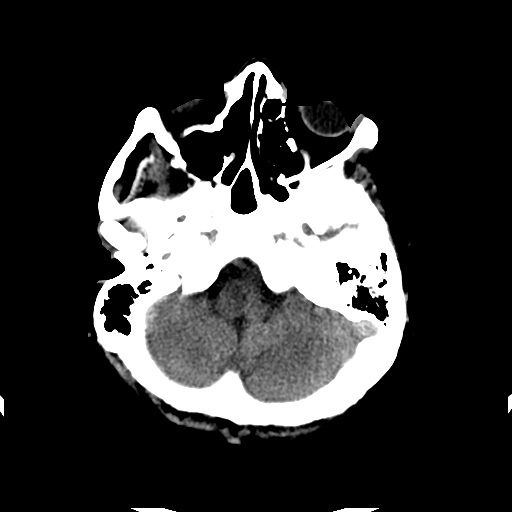
[im 7/32  brain]
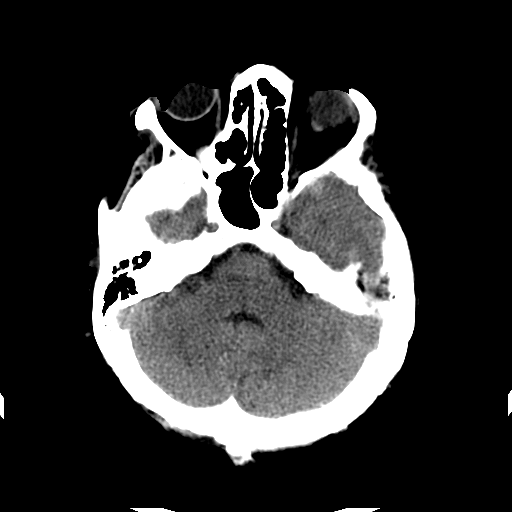
[im 12/32  brain]
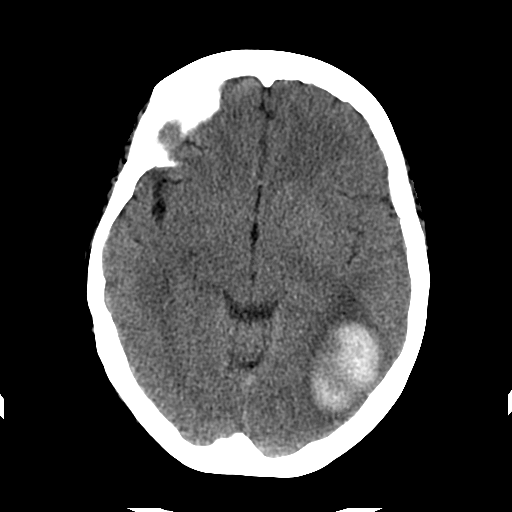
[im 14/32  brain]
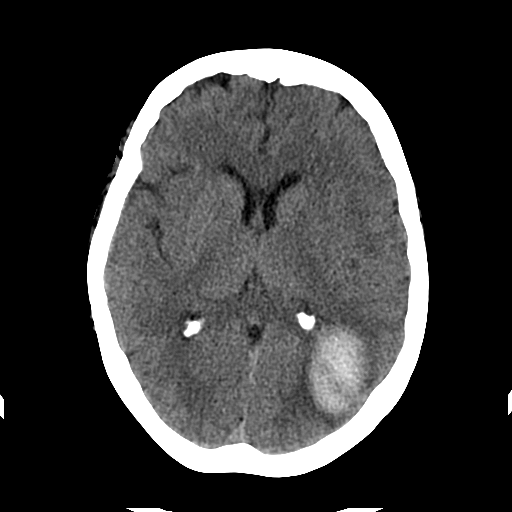
[im 14/32  bone]
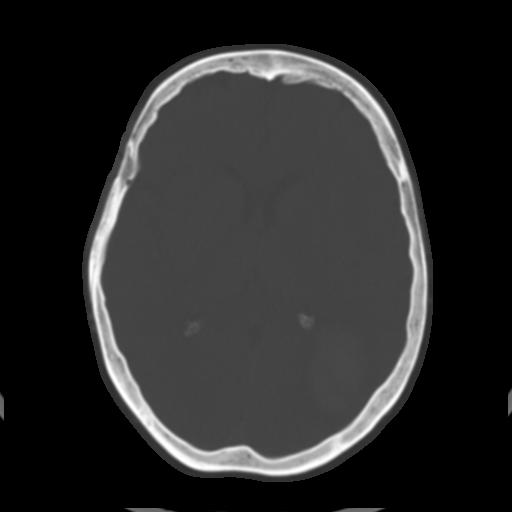
[im 16/32  brain]
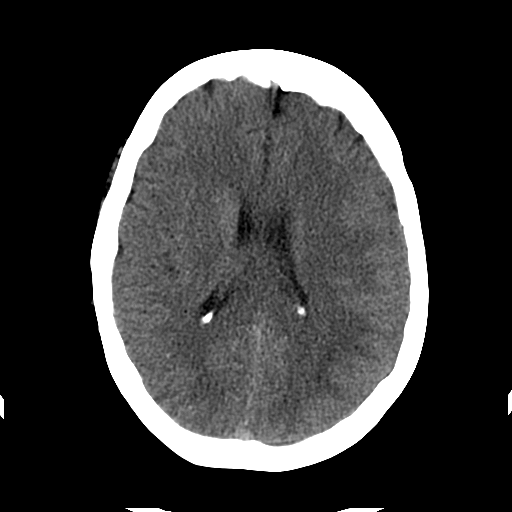
[im 18/32  brain]
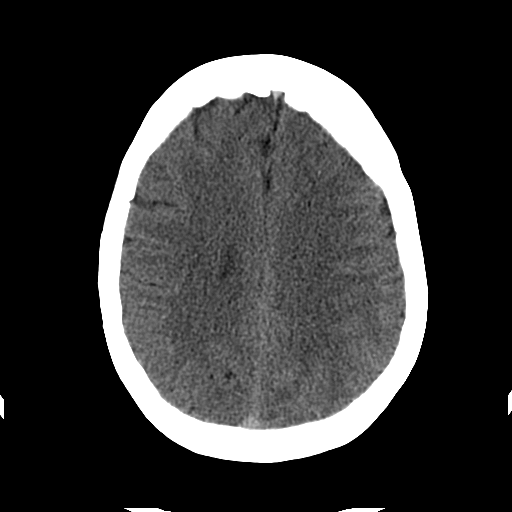
[im 20/32  brain]
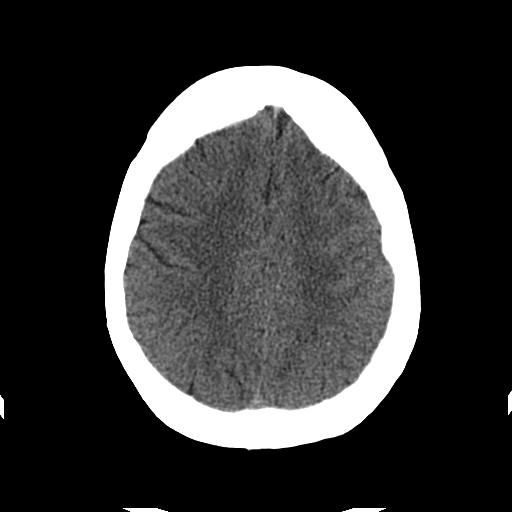
[im 25/32  brain]
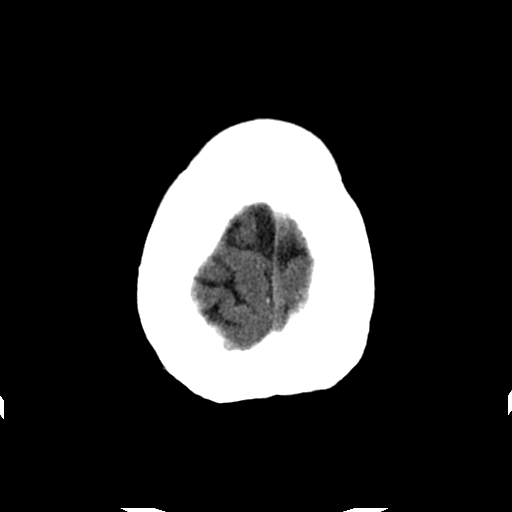
[im 25/32  bone]
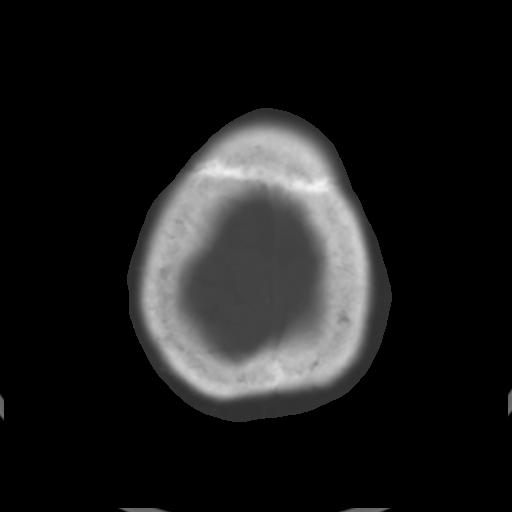
[im 27/32  brain]
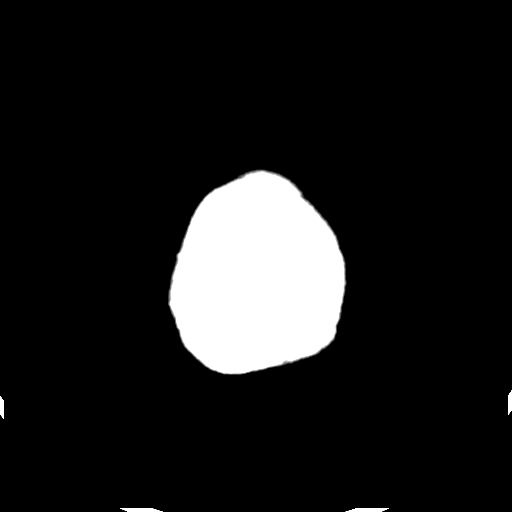
[im 29/32  brain]
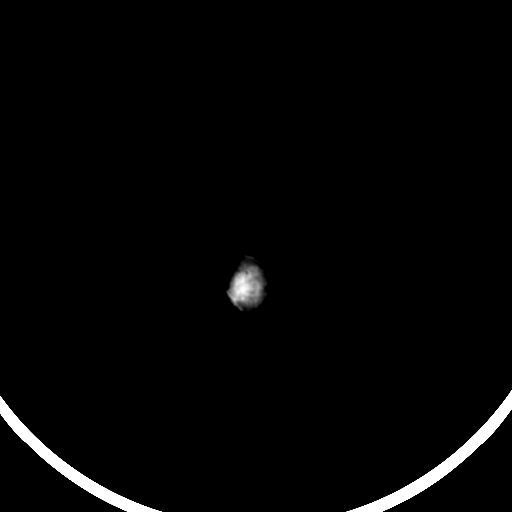

[Series 203: coronal st, idose (1) · coronal · 0.40mm/px · 3 of 66 slices shown]
[im 22/66  brain]
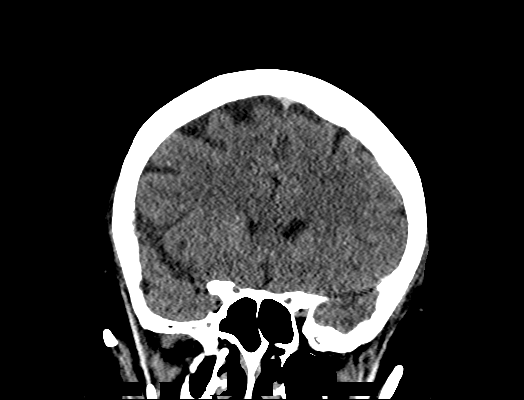
[im 29/66  brain]
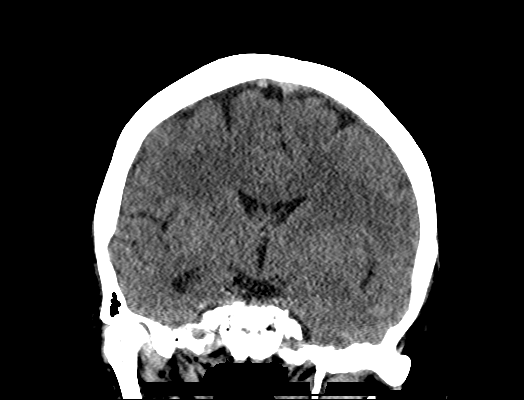
[im 37/66  brain]
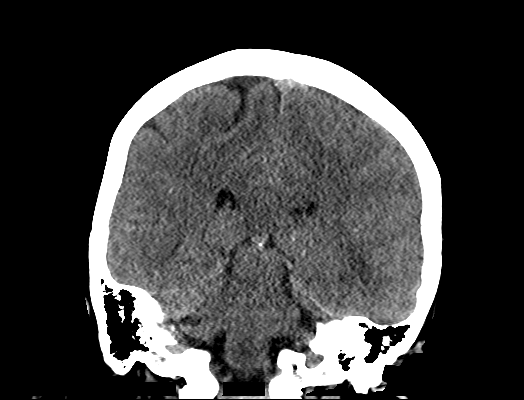

[Series 204: sagittal st, idose (1) · sagittal · 0.40mm/px · 3 of 70 slices shown]
[im 24/70  brain]
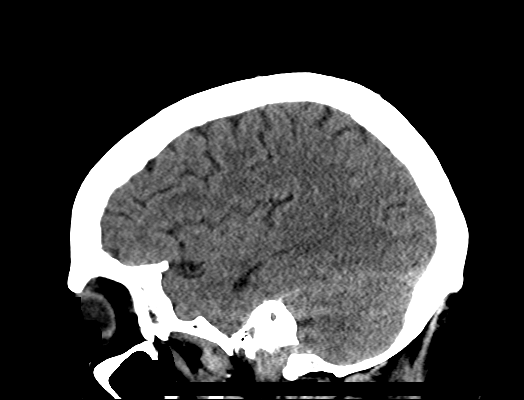
[im 35/70  brain]
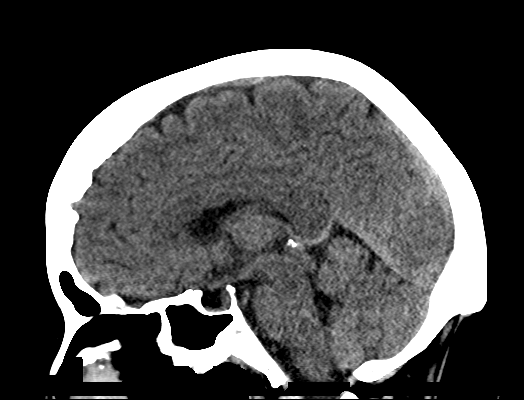
[im 47/70  brain]
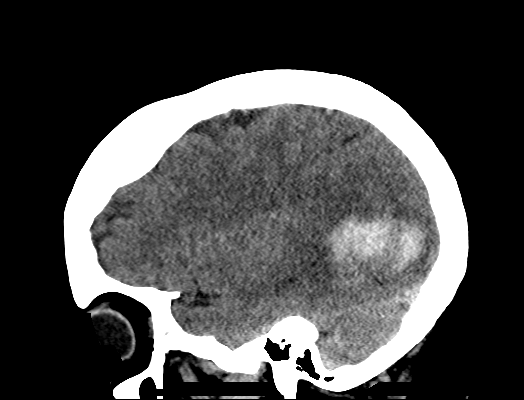

[17 of 47 positions shown; findings below may reference images not displayed]

FINDINGS: Brain: The posterior left hemisphere intra-axial hemorrhage appears
less distinct and is stable with size now 40 x 27 x 30 mm (AP by
transverse by CC) versus 41 x 27 x 28 mm previously. Surrounding
edema has slightly regressed. Stable mild regional mass effect.
Associated small volume intraventricular hemorrhage in the left
lateral ventricle is no longer evident. No ventriculomegaly. No
other extra-axial extension.

No midline shift. Basilar cisterns remain patent. Elsewhere stable
and normal gray-white matter differentiation.

Vascular: Stable CT appearance of the major intracranial vascular
structures.

Skull: Stable visualized osseous structures.

Sinuses/Orbits: Visualized paranasal sinuses and mastoids are stable
and well pneumatized.

Other: No acute orbit or scalp soft tissue findings.
IMPRESSION: 1. Expected evolution of the posterior left hemisphere intra-axial
hemorrhage which is stable in size and slightly less distinct with
less surrounding edema. Stable mild regional mass effect with no
midline shift.
2. Small volume intraventricular hemorrhage appears resolved. No
ventriculomegaly.
3. No new intracranial abnormality.

## 2018-05-30 IMAGING — CT CT VENOGRAM HEAD
3 of 7 series · 18 of 47 positions shown · IV contrast (APPLIED)
Comparison: CT HEAD May 06, 2016 and MRV April 24, 2016 and
MRI/MRA head April 19, 2016

CLINICAL DATA: Follow-up intracranial hemorrhage. Assess venous
sinus thrombosis. History of hepatitis-C, hypertension.

EXAM:
CT VENOGRAM HEAD
TECHNIQUE: Axial 4 mm noncontrast CT images from the vertex to the foramen
magnum followed by axial 2 mm contrast-enhanced CT images with
coronal and sagittal reformations.
CONTRAST:  75mL 90IJL5-EHH IOPAMIDOL (90IJL5-EHH) INJECTION 61%

[Series 5: head with 1.0 h30s · axial · 0.39mm/px · z∈[-133,+8]mm · 15 of 311 slices shown]
[im 15/311  brain]
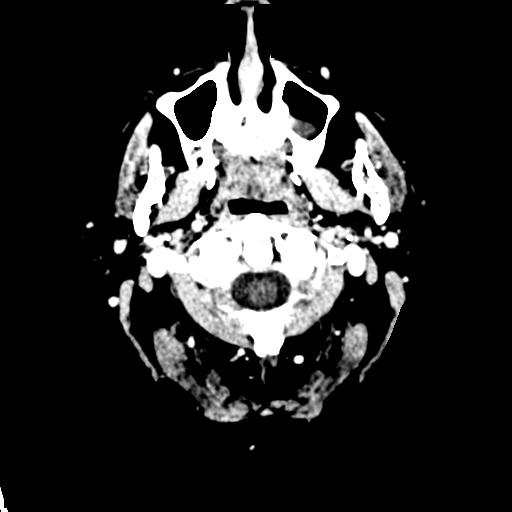
[im 45/311  bone]
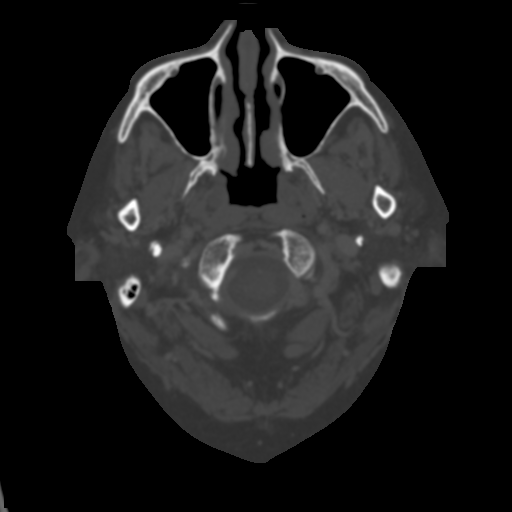
[im 60/311  brain]
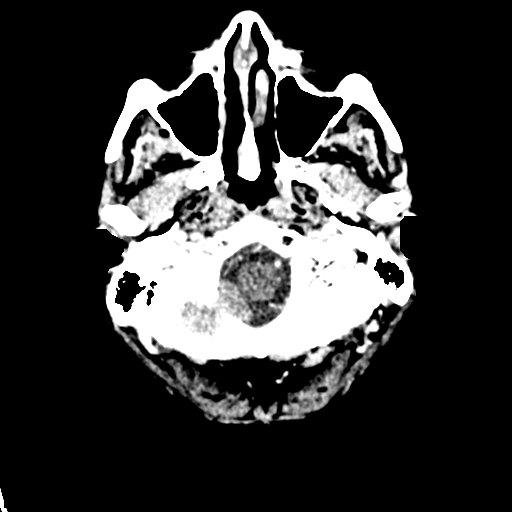
[im 74/311  bone]
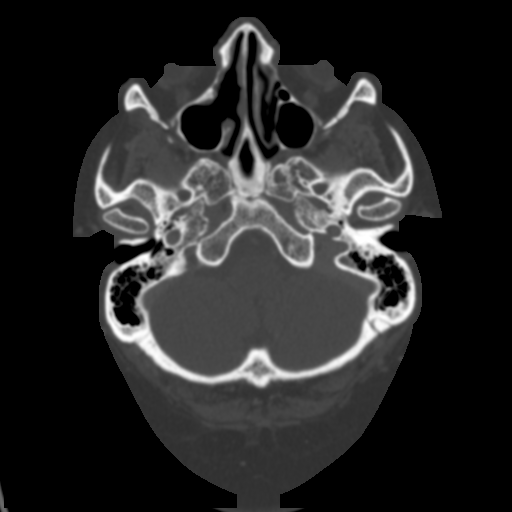
[im 104/311  brain]
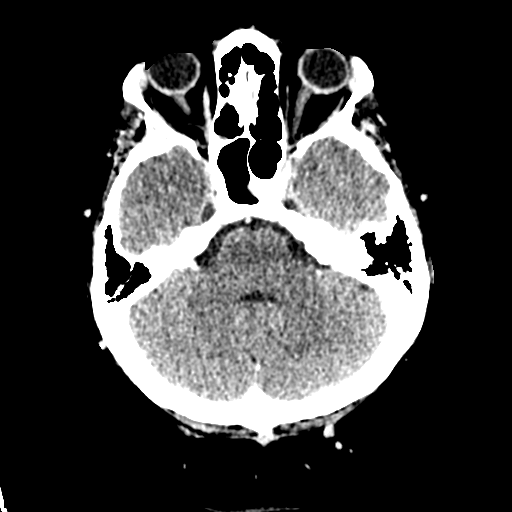
[im 119/311  bone]
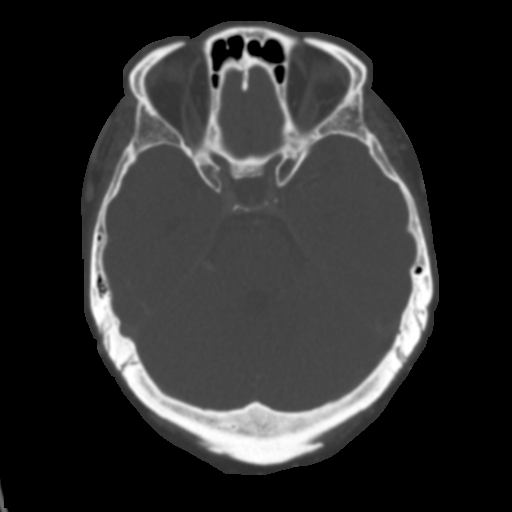
[im 133/311  brain]
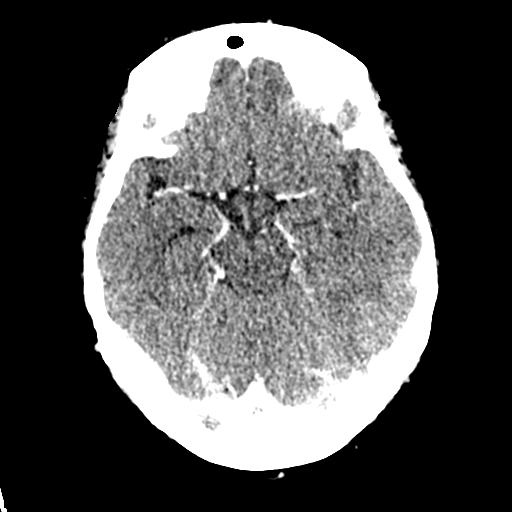
[im 163/311  bone]
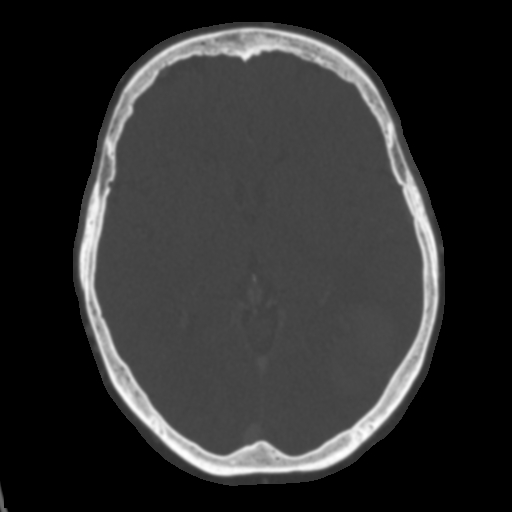
[im 178/311  brain]
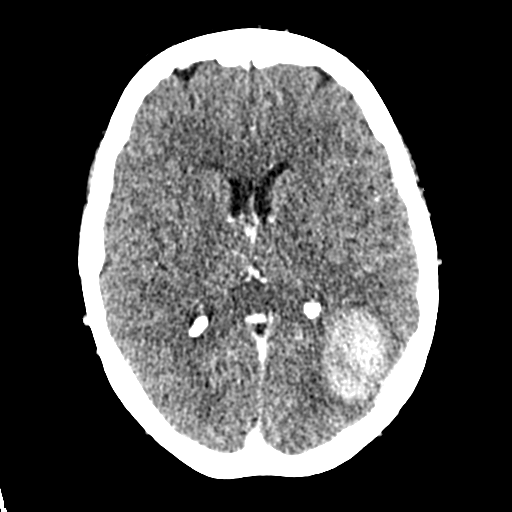
[im 192/311  bone]
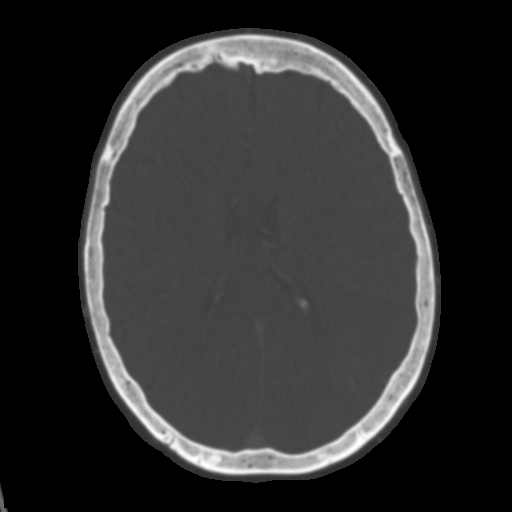
[im 222/311  brain]
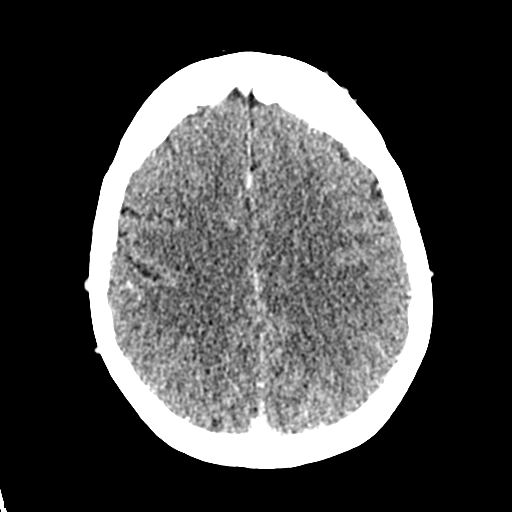
[im 237/311  bone]
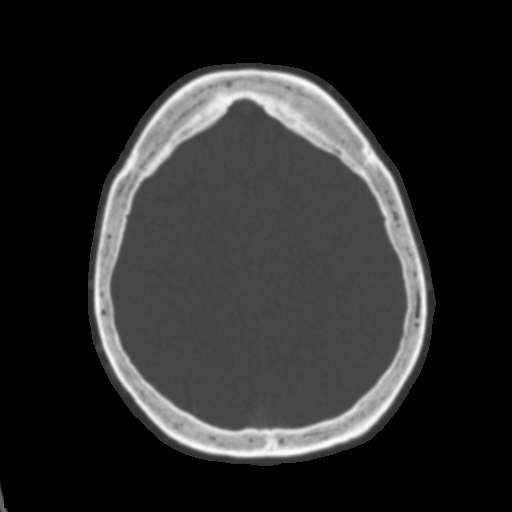
[im 251/311  brain]
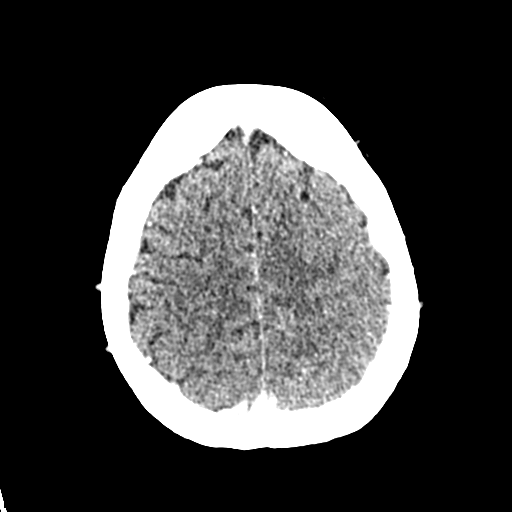
[im 281/311  bone]
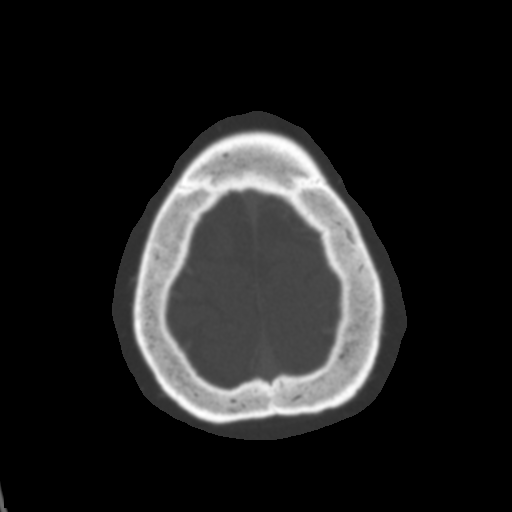
[im 296/311  brain]
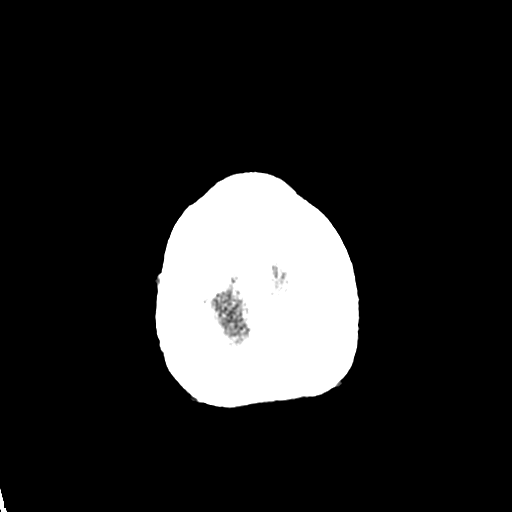

[Series 6: head wo 3.0 mpr · coronal · 0.29mm/px · 2 of 65 slices shown (1 of 2)]
[im 22/65  brain]
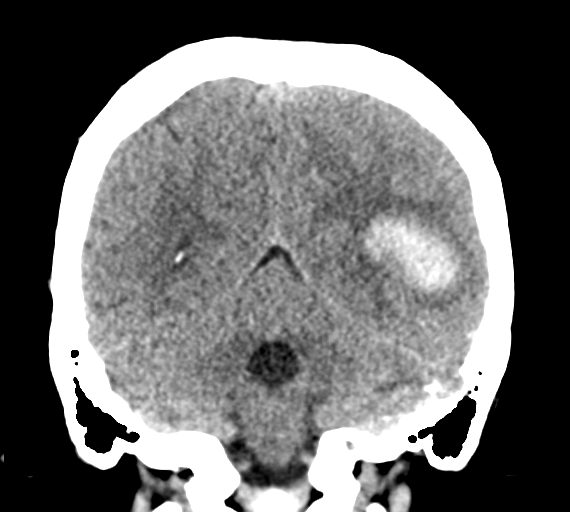
[im 43/65  brain]
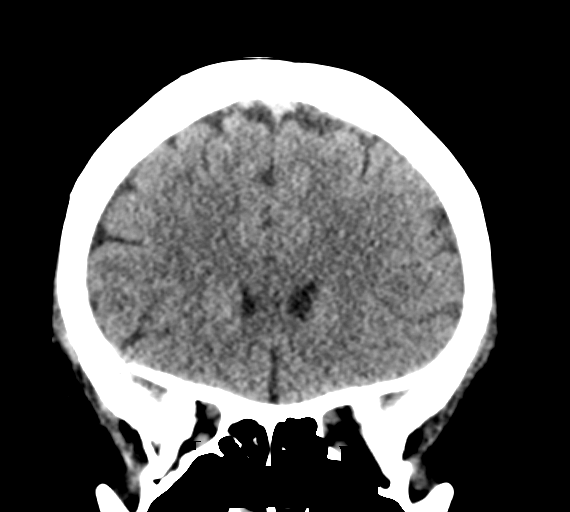

[Series 7: head wo 3.0 mpr · sagittal · 0.29mm/px · 1 of 53 slices shown (2 of 2)]
[im 27/53  brain]
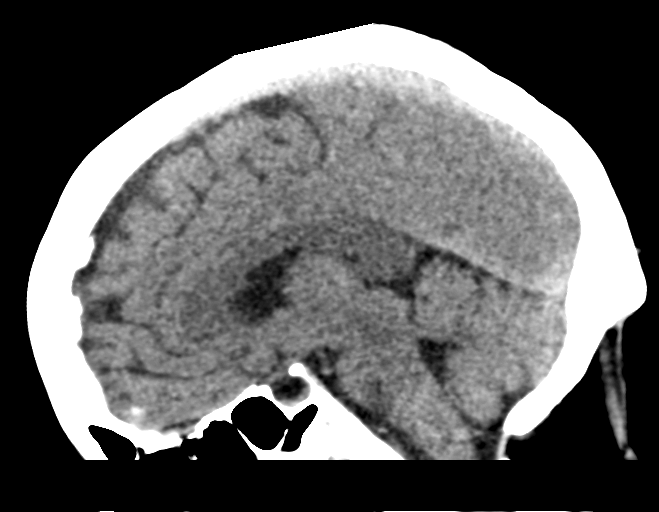

[18 of 47 positions shown; findings below may reference images not displayed]

FINDINGS: CT HEAD:

BRAIN: Evolving 2.8 x 4.1 x 2.8 cm (volume = 17 cm^3) LEFT
parietal occipital lobe hemorrhage with surrounding low-density
vasogenic edema. Trace LEFT occipital subarachnoid density. Local
mass-effect, and no midline shift. Partially effaced LEFT occipital
horn, no hydrocephalus. No intraventricular blood products. No acute
large vascular territory infarcts. No abnormal extra-axial fluid
collections. Basal cisterns are patent.

VASCULAR: Trace calcific atherosclerosis of the carotid siphons. No
abnormal density within the major dural venous sinuses.

SKULL/SOFT TISSUES: No skull fracture. No significant soft tissue
swelling.

ORBITS/SINUSES: The included ocular globes and orbital contents are
normal.Small LEFT maxillary mucosal retention cyst. Mild ethmoid
sinusitis. Status post RIGHT antrectomy and turbinectomy.

OTHER: None.

CTV HEAD: Homogeneous normal contrast opacification of the superior
sagittal sinus and torcula. Lobulated filling defects within the
LEFT greater than RIGHT transverse sinuses most compatible with
arachnoid granulations without discrete thrombus. Normal appearance
of the sigmoid sinus and included internal jugular veins.

Irregular approximate 15 x 4 mm abnormal enhancement along the
inferior margin of the hemorrhage with tangled appearance.
IMPRESSION: CT HEAD: Evolving LEFT parietal occipital lobe hemorrhage with local
mass effect, no midline shift. Trace LEFT occipital lobe
subarachnoid hemorrhage, alternately thrombosed cortical vein.

CTV head: Bilateral transverse sinus nonocclusive arachnoid
granulation without convincing evidence of dural venous sinus
thrombosis.

Abnormal enhancement along the inferior margin of the hemorrhage,
though this could represent transient hyperemia, underlying vascular
malformation or mass is a consideration. Recommend 6 week follow-up
MRI of the head with contrast. Alternatively CTA or catheter
angiography could be done sooner.

## 2018-06-25 ENCOUNTER — Emergency Department: Payer: No Typology Code available for payment source

## 2018-06-25 ENCOUNTER — Emergency Department
Admission: EM | Admit: 2018-06-25 | Discharge: 2018-06-25 | Disposition: A | Discharge status: Home / Self Care | Payer: No Typology Code available for payment source | Attending: Emergency Medicine | Admitting: Emergency Medicine

## 2018-06-25 ENCOUNTER — Encounter: Payer: Self-pay | Admitting: Emergency Medicine

## 2018-06-25 DIAGNOSIS — Z79899 Other long term (current) drug therapy: Secondary | ICD-10-CM | POA: Insufficient documentation

## 2018-06-25 DIAGNOSIS — I959 Hypotension, unspecified: Secondary | ICD-10-CM

## 2018-06-25 DIAGNOSIS — S0990XA Unspecified injury of head, initial encounter: Secondary | ICD-10-CM | POA: Insufficient documentation

## 2018-06-25 DIAGNOSIS — Y999 Unspecified external cause status: Secondary | ICD-10-CM | POA: Insufficient documentation

## 2018-06-25 DIAGNOSIS — M545 Low back pain: Secondary | ICD-10-CM | POA: Diagnosis not present

## 2018-06-25 DIAGNOSIS — W01190A Fall on same level from slipping, tripping and stumbling with subsequent striking against furniture, initial encounter: Secondary | ICD-10-CM | POA: Insufficient documentation

## 2018-06-25 DIAGNOSIS — Y9389 Activity, other specified: Secondary | ICD-10-CM | POA: Diagnosis not present

## 2018-06-25 DIAGNOSIS — I10 Essential (primary) hypertension: Secondary | ICD-10-CM | POA: Insufficient documentation

## 2018-06-25 DIAGNOSIS — E119 Type 2 diabetes mellitus without complications: Secondary | ICD-10-CM | POA: Insufficient documentation

## 2018-06-25 DIAGNOSIS — Z7902 Long term (current) use of antithrombotics/antiplatelets: Secondary | ICD-10-CM | POA: Insufficient documentation

## 2018-06-25 DIAGNOSIS — Y92009 Unspecified place in unspecified non-institutional (private) residence as the place of occurrence of the external cause: Secondary | ICD-10-CM | POA: Insufficient documentation

## 2018-06-25 DIAGNOSIS — F17210 Nicotine dependence, cigarettes, uncomplicated: Secondary | ICD-10-CM | POA: Insufficient documentation

## 2018-06-25 DIAGNOSIS — N390 Urinary tract infection, site not specified: Secondary | ICD-10-CM | POA: Insufficient documentation

## 2018-06-25 DIAGNOSIS — M549 Dorsalgia, unspecified: Secondary | ICD-10-CM | POA: Diagnosis not present

## 2018-06-25 DIAGNOSIS — R1084 Generalized abdominal pain: Secondary | ICD-10-CM | POA: Insufficient documentation

## 2018-06-25 DIAGNOSIS — Z7984 Long term (current) use of oral hypoglycemic drugs: Secondary | ICD-10-CM | POA: Insufficient documentation

## 2018-06-25 LAB — BASIC METABOLIC PANEL
ANION GAP: 8 (ref 5–15)
BUN: 13 mg/dL (ref 6–20)
CALCIUM: 9.3 mg/dL (ref 8.9–10.3)
CO2: 29 mmol/L (ref 22–32)
CREATININE: 0.73 mg/dL (ref 0.44–1.00)
Chloride: 103 mmol/L (ref 98–111)
GLUCOSE: 124 mg/dL — AB (ref 70–99)
Potassium: 4.5 mmol/L (ref 3.5–5.1)
Sodium: 140 mmol/L (ref 135–145)

## 2018-06-25 LAB — ETHANOL: Alcohol, Ethyl (B): <10 mg/dL (ref <10)

## 2018-06-25 LAB — URINALYSIS, COMPLETE (UACMP) WITH MICROSCOPIC
Bilirubin Urine: NEGATIVE
Glucose, UA: >=500 mg/dL — AB
HGB URINE DIPSTICK: NEGATIVE
Ketones, ur: NEGATIVE mg/dL
Nitrite: NEGATIVE
PH: 6 (ref 5.0–8.0)
Protein, ur: NEGATIVE mg/dL
SPECIFIC GRAVITY, URINE: 1.009 (ref 1.005–1.030)

## 2018-06-25 LAB — HEPATIC FUNCTION PANEL
ALBUMIN: 3.2 g/dL — AB (ref 3.5–5.0)
ALK PHOS: 100 U/L (ref 38–126)
ALT: 21 U/L (ref 0–44)
AST: 19 U/L (ref 15–41)
BILIRUBIN TOTAL: 0.4 mg/dL (ref 0.3–1.2)
Total Protein: 5.9 g/dL — ABNORMAL LOW (ref 6.5–8.1)

## 2018-06-25 LAB — GLUCOSE, CAPILLARY: GLUCOSE-CAPILLARY: 118 mg/dL — AB (ref 70–99)

## 2018-06-25 LAB — CBC
HCT: 43.1 % (ref 36.0–46.0)
Hemoglobin: 13.8 g/dL (ref 12.0–15.0)
MCH: 30.5 pg (ref 26.0–34.0)
MCHC: 32 g/dL (ref 30.0–36.0)
MCV: 95.1 fL (ref 80.0–100.0)
Platelets: 194 10*3/uL (ref 150–400)
RBC: 4.53 MIL/uL (ref 3.87–5.11)
RDW: 12.9 % (ref 11.5–15.5)
WBC: 7.1 10*3/uL (ref 4.0–10.5)
nRBC: 0 % (ref 0.0–0.2)

## 2018-06-25 LAB — URINE DRUG SCREEN, QUALITATIVE (ARMC ONLY)
Amphetamines, Ur Screen: NOT DETECTED
BARBITURATES, UR SCREEN: NOT DETECTED
Benzodiazepine, Ur Scrn: POSITIVE — AB
CANNABINOID 50 NG, UR ~~LOC~~: NOT DETECTED
COCAINE METABOLITE, UR ~~LOC~~: NOT DETECTED
MDMA (ECSTASY) UR SCREEN: NOT DETECTED
Methadone Scn, Ur: NOT DETECTED
OPIATE, UR SCREEN: NOT DETECTED
PHENCYCLIDINE (PCP) UR S: NOT DETECTED
TRICYCLIC, UR SCREEN: NOT DETECTED

## 2018-06-25 LAB — LIPASE, BLOOD: Lipase: 23 U/L (ref 11–51)

## 2018-06-25 LAB — ACETAMINOPHEN LEVEL: Acetaminophen (Tylenol), Serum: <10 ug/mL — ABNORMAL LOW (ref 10–30)

## 2018-06-25 LAB — SALICYLATE LEVEL

## 2018-06-25 LAB — TROPONIN I: Troponin I: <0.03 ng/mL (ref <0.03)

## 2018-06-25 MED ORDER — CEPHALEXIN 500 MG PO CAPS: 500.0000 mg | ORAL_CAPSULE | Freq: Once | ORAL | Status: DC

## 2018-06-25 MED ORDER — MORPHINE SULFATE (PF) 4 MG/ML IV SOLN
4.0000 mg | Freq: Once | INTRAVENOUS | Status: AC
  Administered 2018-06-25: 4 mg via INTRAVENOUS
  Filled 2018-06-25: qty 1

## 2018-06-25 MED ORDER — SODIUM CHLORIDE 0.9 % IV SOLN
Freq: Once | INTRAVENOUS | Status: AC
  Administered 2018-06-25: 16:00:00 via INTRAVENOUS

## 2018-06-25 MED ORDER — METHYLPREDNISOLONE SODIUM SUCC 40 MG IJ SOLR
40.0000 mg | Freq: Once | INTRAMUSCULAR | Status: AC
  Administered 2018-06-25: 40 mg via INTRAVENOUS
  Filled 2018-06-25: qty 1

## 2018-06-25 MED ORDER — CEPHALEXIN 500 MG PO CAPS: 500.0000 mg | ORAL_CAPSULE | Freq: Three times a day (TID) | ORAL | 0 refills | Status: AC

## 2018-06-25 MED ORDER — SODIUM CHLORIDE 0.9 % IV BOLUS
1000.0000 mL | Freq: Once | INTRAVENOUS | Status: AC
  Administered 2018-06-25: 1000 mL via INTRAVENOUS

## 2018-06-25 MED ORDER — DIPHENHYDRAMINE HCL 50 MG/ML IJ SOLN
25.0000 mg | Freq: Once | INTRAMUSCULAR | Status: DC
  Filled 2018-06-25: qty 1

## 2018-06-25 NOTE — ED Notes (Signed)
Pt threw her discharge instructions and prescription in the trash in the room

## 2018-06-25 NOTE — ED Provider Notes (Addendum)
[facility] Emergency Department Provider Note  ___________________________________________   First MD Initiated Contact with Patient 06/25/18 1508     (approximate)  I have reviewed the triage vital signs and the nursing notes.   HISTORY  Chief Complaint Dizziness   HPI Stephanie [name] is a 52 y.o. female with a history of diabetes, diverticulitis, hepatitis C and recurrent headache was presented to the emergency department today with dizziness as well as headache after a fall.  She says that she was getting up from a couch and felt very dizzy.  Says that she fell, hitting her head on into table.  Says that she then had difficulty getting up several other times because of dizziness.  Denied feeling any abnormal symptoms prior to getting up from a couch initially and then feeling dizziness.  Says that her pain is a 10 out of 10 in her head as well as to the thoracic and lumbar spines.  Denies any fever.  Denies any burning with urination or frequency.  Reports normal p.o. intake.  Patient initially in triage with blood pressure in the 70s systolic.  Past Medical History:  Diagnosis Date  . Diabetes mellitus without complication (HCC)   . Difficult intubation   . Diverticulitis   . Headache   . Hepatitis C    treated and resolved  . History of kidney stones   . Hypertension   . IBS (irritable bowel syndrome)   . Kidney stones   . Sciatica   . Sleep apnea    on cpap  . Stroke [facility]) 2017   memory loss    Patient Active Problem List   Diagnosis Date Noted  . Shoulder pain, right 12/22/2017  . UTI (urinary tract infection) 08/21/2017  . Nausea & vomiting 08/21/2017  . Nicotine dependence 08/21/2017  . Headache 01/07/2017  . Small bowel obstruction (HCC) 09/02/2016  . Pain and swelling of left upper extremity 07/22/2016  . Superficial venous thrombosis of arm, left 07/22/2016  . ICH (intracerebral hemorrhage) (HCC)   . Essential hypertension,  malignant 04/19/2016  . Cytotoxic brain edema (HCC) 04/19/2016  . IVH (intraventricular hemorrhage) (HCC) 04/18/2016  . Hepatitis C     Past Surgical History:  Procedure Laterality Date  . ABDOMINAL HYSTERECTOMY    . carpel tunnel syndrome  2017  . HARDWARE REMOVAL Right 12/22/2017   Procedure: HARDWARE REMOVAL-RIGHT ARM;  Surgeon: Lyndle Herrlich, MD;  Location: [facility] ORS;  Service: Orthopedics;  Laterality: Right;  Removal of implant, superficial right humerus  . HUMERUS IM NAIL Right 03/11/2017   Procedure: INTRAMEDULLARY (IM) NAIL HUMERAL;  Surgeon: Lyndle Herrlich, MD;  Location: [facility] ORS;  Service: Orthopedics;  Laterality: Right;  . KIDNEY STONE SURGERY Right 02/12/12  . KIDNEY STONE SURGERY Left 07/15/2012  . LIGATION OF ARTERIOVENOUS  FISTULA Left 06/21/2016   Procedure: LIGATION OF ARTERIOVENOUS  FISTULA ( LIGATION BASILIC VEIN );  Surgeon: Renford Dills, MD;  Location: [facility] ORS;  Service: Vascular;  Laterality: Left;  . LITHOTRIPSY    . OOPHORECTOMY    . SINUS EXPLORATION    . TONSILLECTOMY      Prior to Admission medications   Medication Sig Start Date End Date Taking? Authorizing Provider  alprazolam Prudy Feeler) 2 MG tablet Take 1 mg by mouth 2 (two) times daily.     [provider]  atorvastatin (LIPITOR) 40 MG tablet Take 40 mg by mouth every evening.    [provider]  clopidogrel (PLAVIX) 75  MG tablet Take 1 tablet (75 mg total) by mouth daily. 06/27/16   Marvel Plan, MD  diltiazem (DILACOR XR) 180 MG 24 hr capsule Take 180 mg by mouth daily.    [provider]  FLUoxetine (PROZAC) 20 MG tablet Take 20 mg by mouth daily.    [provider]  glipiZIDE (GLUCOTROL) 5 MG tablet Take 1 tablet (5 mg total) by mouth 2 (two) times daily before a meal. 04/30/16   Lora Paula, MD  levETIRAcetam (KEPPRA) 500 MG tablet Take 500 mg by mouth 2 (two) times daily. 02/16/18   [provider]  losartan (COZAAR) 100 MG tablet Take 100 mg by  mouth daily.  08/08/17   [provider]  omeprazole (PRILOSEC) 40 MG capsule Take 40 mg by mouth daily.    [provider]  pregabalin (LYRICA) 50 MG capsule Take 50 mg by mouth at bedtime. 04/30/18   [provider]  promethazine (PHENERGAN) 25 MG tablet Take 25 mg by mouth every 6 (six) hours as needed for nausea or vomiting.  08/08/17   [provider]  zolpidem (AMBIEN) 10 MG tablet Take 10 mg by mouth at bedtime.  08/14/17   [provider]    Allergies Gabapentin; Tylenol [acetaminophen]; Aspirin; Buprenorphine hcl; Codeine; Compazine; Ioxaglate; Ivp dye [iodinated diagnostic agents]; Metrizamide; Naproxen; Norco [hydrocodone-acetaminophen]; Penicillin g; Penicillins; Prochlorperazine maleate; Reglan [metoclopramide]; Sulfur; Tegretol [carbamazepine]; Toradol [ketorolac tromethamine]; Tramadol; and Zofran [ondansetron hcl]  Family History  Problem Relation Age of Onset  . Heart failure Mother   . Diabetes Father   . Cancer Sister     Social History Social History   Tobacco Use  . Smoking status: Current Every Day Smoker    Packs/day: 1.00    Years: 1.50    Pack years: 1.50    Types: Cigarettes  . Smokeless tobacco: Never Used  Substance Use Topics  . Alcohol use: No    Comment: no alcohol since 2002  . Drug use: No    Comment: electric cig    Review of Systems  Constitutional: No fever/chills Eyes: No visual changes. ENT: No sore throat. Cardiovascular: Denies chest pain. Respiratory: Denies shortness of breath. Gastrointestinal: No nausea, no vomiting.  No diarrhea.  No constipation. Genitourinary: Negative for dysuria. Musculoskeletal: Negative for back pain. Skin: Negative for rash. Neurological: Negative for  focal weakness or numbness.   ____________________________________________   PHYSICAL EXAM:  VITAL SIGNS: ED Triage Vitals  Enc Vitals Group     BP 06/25/18 1448 (!) 72/44     Pulse Rate 06/25/18 1448  61     Resp 06/25/18 1448 16     Temp 06/25/18 1448 97.7 F (36.5 C)     Temp Source 06/25/18 1448 Oral     SpO2 06/25/18 1448 100 %     Weight 06/25/18 1449 190 lb (86.2 kg)     Height 06/25/18 1449 5\' 2"  (1.575 m)     Head Circumference --      Peak Flow --      Pain Score 06/25/18 1449 0     Pain Loc --      Pain Edu? --      Excl. in GC? --     Constitutional: Alert and oriented.  Patient slow to respond to questions but responds appropriately. Eyes: Conjunctivae are normal.  Head: Atraumatic. Nose: No congestion/rhinnorhea. Mouth/Throat: Mucous membranes are moist.  Neck: No stridor.  No tenderness to midline cervical spine. Cardiovascular: Normal rate, regular  rhythm. Grossly normal heart sounds.  Good peripheral circulation. Respiratory: Normal respiratory effort.  No retractions. Lungs CTAB. Gastrointestinal: Soft with mild to moderate diffuse tenderness to palpation without rebound or guarding. No distention. No CVA tenderness. Musculoskeletal: No lower extremity tenderness nor edema.  No joint effusions.  Patient with diffuse tenderness to palpation of thoracic and lumbar spines.  No focal tenderness to palpation noted to step-off or deformity.  5 out of 5 strength bilateral lower extremities. Neurologic:  Normal speech and language. No gross focal neurologic deficits are appreciated. Skin:  Skin is warm, dry and intact. No rash noted. Psychiatric: Mood and affect are normal. Speech and behavior are normal.  ____________________________________________   LABS (all labs ordered are listed, but only abnormal results are displayed)  Labs Reviewed  GLUCOSE, CAPILLARY - Abnormal; Notable for the following components:      Result Value   Glucose-Capillary 118 (*)    All other components within normal limits  BASIC METABOLIC PANEL - Abnormal; Notable for the following components:   Glucose, Bld 124 (*)    All other components within normal limits  URINALYSIS, COMPLETE  (UACMP) WITH MICROSCOPIC - Abnormal; Notable for the following components:   Color, Urine YELLOW (*)    APPearance CLEAR (*)    Glucose, UA >=500 (*)    Leukocytes, UA TRACE (*)    Bacteria, UA RARE (*)    All other components within normal limits  HEPATIC FUNCTION PANEL - Abnormal; Notable for the following components:   Total Protein 5.9 (*)    Albumin 3.2 (*)    All other components within normal limits  URINE DRUG SCREEN, QUALITATIVE ([facility] ONLY) - Abnormal; Notable for the following components:   Benzodiazepine, Ur Scrn POSITIVE (*)    All other components within normal limits  ACETAMINOPHEN LEVEL - Abnormal; Notable for the following components:   Acetaminophen (Tylenol), Serum <10 (*)    All other components within normal limits  URINE CULTURE  CBC  TROPONIN I  LIPASE, BLOOD  SALICYLATE LEVEL  ETHANOL  CBG MONITORING, ED   ____________________________________________  EKG  ED ECG REPORT I, Arelia Longest, the attending physician, personally viewed and interpreted this ECG.   Date: 06/25/2018  EKG Time: 1450  Rate: 61  Rhythm: normal sinus rhythm  Axis: Normal  Intervals:none  ST&T Change: No ST segment elevation or depression.  No abnormal T wave inversion.  ____________________________________________  RADIOLOGY  No Acute Finding of the Head nor Abdominal Scans. ____________________________________________   PROCEDURES  Procedure(s) performed:   Procedures  Critical Care performed:   ____________________________________________   INITIAL IMPRESSION / ASSESSMENT AND PLAN / ED COURSE  Pertinent labs & imaging results that were available during my care of the patient were reviewed by me and considered in my medical decision making (see chart for details).  Differential diagnosis includes, but is not limited to, ovarian cyst, ovarian torsion, acute appendicitis, diverticulitis, urinary tract infection/pyelonephritis, endometriosis, bowel  obstruction, colitis, renal colic, gastroenteritis, hernia, fibroids, endometriosis, pregnancy related pain including ectopic pregnancy, etc. Differential diagnosis includes, but is not limited to, intracranial hemorrhage, meningitis/encephalitis, previous head trauma, cavernous venous thrombosis, tension headache, temporal arteritis, migraine or migraine equivalent, idiopathic intracranial hypertension, and non-specific headache. As part of my medical decision making, I reviewed the following data within the electronic MEDICAL RECORD NUMBER Notes from prior ED visits  ----------------------------------------- 5:31 PM on 06/25/2018 -----------------------------------------  Patient continues to complain of headache and requesting morphine at this time.  Blood pressure is  improved.  We will give IV pain medication.  Pending labs at this time.  Reassuring imaging.  Pressure continues to improve.  ----------------------------------------- 7:00 PM on 06/25/2018 -----------------------------------------  Patient at this time awake and alert.  Evidence of UTI and benzodiazepine positive urine.  Nurse overheard patient and husband talked about the patient taking too many pills.  However, the patient says that she only takes Xanax twice a day and does not like the way it makes her feel so she sometimes does not take it.  Patient's blood pressure is resolved.  Will discharge with Keflex.  Does not appear to be an intent of self-harm.  Patient does not express any suicidal ideation.  Repeatedly asking for more morphine.  However, I am concerned because the patient's previous confusion and hypotension about getting of this medication and the possible adverse effects.  Patient has been ambulatory on her own without issue.  Patient will be discharged at this time. ____________________________________________   FINAL CLINICAL IMPRESSION(S) / ED DIAGNOSES  Hypotension.  Headache.  Fall.  Back pain.  NEW  MEDICATIONS STARTED DURING THIS VISIT:  New Prescriptions   No medications on file     Note:  This document was prepared using Dragon voice recognition software and may include unintentional dictation errors.     Myrna Blazer, MD 06/25/18 Mikle Bosworth    Myrna Blazer, MD 06/25/18 [phone]

## 2018-06-25 NOTE — ED Notes (Signed)
Pt up to [facility] with steady gate. Pt states she is hungry and wants some coffee

## 2018-06-25 NOTE — ED Notes (Signed)
Pt is currently in CT.

## 2018-06-25 NOTE — ED Triage Notes (Signed)
PT arrives with husband from home. Pt called husband at 1300 and husband noticed pt was slurring her words. Unclear time frame of when symptoms started. Pt sluggish is triage. Pt had stroke in 2017 but husband states pt's only deficit was mental delay in remembering. PT denies any pain. CBG 118

## 2018-06-25 NOTE — ED Notes (Signed)
Pt is alert and oriented and insistent that she goes home. States she doesn't want her abx she came her for pain medicine and the doctor won't give her any. Dr Pershing Proud aware and states she can be discharged. Encouraged pt to wait for her paperwork. She has to wait for her husband to come back.

## 2018-06-25 NOTE — ED Notes (Addendum)
Pt provided with crackers and  Coffee with Dr. Marquis Buggy permission  Pt successfully completed 3oz swallow screen

## 2018-06-25 NOTE — ED Notes (Signed)
ED Provider at bedside. 

## 2018-06-25 NOTE — ED Notes (Signed)
1 failed IV attempt in R hand by Idalia Needle, RN

## 2018-06-28 ENCOUNTER — Encounter (HOSPITAL_COMMUNITY): Payer: Self-pay | Admitting: *Deleted

## 2018-06-28 ENCOUNTER — Emergency Department (HOSPITAL_COMMUNITY): Payer: No Typology Code available for payment source

## 2018-06-28 ENCOUNTER — Emergency Department (HOSPITAL_COMMUNITY)
Admission: EM | Admit: 2018-06-28 | Discharge: 2018-06-28 | Disposition: A | Discharge status: Home / Self Care | Payer: No Typology Code available for payment source | Attending: Emergency Medicine | Admitting: Emergency Medicine

## 2018-06-28 ENCOUNTER — Other Ambulatory Visit: Payer: Self-pay

## 2018-06-28 DIAGNOSIS — E119 Type 2 diabetes mellitus without complications: Secondary | ICD-10-CM | POA: Diagnosis not present

## 2018-06-28 DIAGNOSIS — Z7984 Long term (current) use of oral hypoglycemic drugs: Secondary | ICD-10-CM | POA: Insufficient documentation

## 2018-06-28 DIAGNOSIS — Z7902 Long term (current) use of antithrombotics/antiplatelets: Secondary | ICD-10-CM | POA: Diagnosis not present

## 2018-06-28 DIAGNOSIS — Y998 Other external cause status: Secondary | ICD-10-CM | POA: Diagnosis not present

## 2018-06-28 DIAGNOSIS — Z8673 Personal history of transient ischemic attack (TIA), and cerebral infarction without residual deficits: Secondary | ICD-10-CM | POA: Diagnosis not present

## 2018-06-28 DIAGNOSIS — I10 Essential (primary) hypertension: Secondary | ICD-10-CM | POA: Diagnosis not present

## 2018-06-28 DIAGNOSIS — W19XXXA Unspecified fall, initial encounter: Secondary | ICD-10-CM | POA: Diagnosis not present

## 2018-06-28 DIAGNOSIS — F17210 Nicotine dependence, cigarettes, uncomplicated: Secondary | ICD-10-CM | POA: Insufficient documentation

## 2018-06-28 DIAGNOSIS — Y929 Unspecified place or not applicable: Secondary | ICD-10-CM | POA: Insufficient documentation

## 2018-06-28 DIAGNOSIS — R197 Diarrhea, unspecified: Secondary | ICD-10-CM | POA: Insufficient documentation

## 2018-06-28 DIAGNOSIS — Y939 Activity, unspecified: Secondary | ICD-10-CM | POA: Insufficient documentation

## 2018-06-28 DIAGNOSIS — R1084 Generalized abdominal pain: Secondary | ICD-10-CM | POA: Insufficient documentation

## 2018-06-28 DIAGNOSIS — Z79899 Other long term (current) drug therapy: Secondary | ICD-10-CM | POA: Diagnosis not present

## 2018-06-28 DIAGNOSIS — S0990XA Unspecified injury of head, initial encounter: Secondary | ICD-10-CM | POA: Insufficient documentation

## 2018-06-28 LAB — CBC WITH DIFFERENTIAL/PLATELET
Abs Immature Granulocytes: 0.02 10*3/uL (ref 0.00–0.07)
BASOS ABS: 0 10*3/uL (ref 0.0–0.1)
BASOS PCT: 1 %
EOS PCT: 2 %
Eosinophils Absolute: 0.1 10*3/uL (ref 0.0–0.5)
HCT: 41.8 % (ref 36.0–46.0)
HEMOGLOBIN: 13.2 g/dL (ref 12.0–15.0)
Immature Granulocytes: 0 %
LYMPHS PCT: 38 %
Lymphs Abs: 2.8 10*3/uL (ref 0.7–4.0)
MCH: 30.2 pg (ref 26.0–34.0)
MCHC: 31.6 g/dL (ref 30.0–36.0)
MCV: 95.7 fL (ref 80.0–100.0)
Monocytes Absolute: 0.6 10*3/uL (ref 0.1–1.0)
Monocytes Relative: 9 %
NEUTROS ABS: 3.7 10*3/uL (ref 1.7–7.7)
NRBC: 0 % (ref 0.0–0.2)
Neutrophils Relative %: 50 %
Platelets: 177 10*3/uL (ref 150–400)
RBC: 4.37 MIL/uL (ref 3.87–5.11)
RDW: 12.9 % (ref 11.5–15.5)
WBC: 7.4 10*3/uL (ref 4.0–10.5)

## 2018-06-28 LAB — COMPREHENSIVE METABOLIC PANEL
ALBUMIN: 3.6 g/dL (ref 3.5–5.0)
ALK PHOS: 117 U/L (ref 38–126)
ALT: 22 U/L (ref 0–44)
ANION GAP: 7 (ref 5–15)
AST: 18 U/L (ref 15–41)
BUN: 7 mg/dL (ref 6–20)
CHLORIDE: 107 mmol/L (ref 98–111)
CO2: 30 mmol/L (ref 22–32)
Calcium: 9 mg/dL (ref 8.9–10.3)
Creatinine, Ser: 0.64 mg/dL (ref 0.44–1.00)
GFR calc non Af Amer: >60 mL/min (ref >60)
GLUCOSE: 97 mg/dL (ref 70–99)
POTASSIUM: 4 mmol/L (ref 3.5–5.1)
SODIUM: 144 mmol/L (ref 135–145)
Total Bilirubin: 0.4 mg/dL (ref 0.3–1.2)
Total Protein: 7 g/dL (ref 6.5–8.1)

## 2018-06-28 LAB — URINE CULTURE

## 2018-06-28 LAB — LIPASE, BLOOD: Lipase: 21 U/L (ref 11–51)

## 2018-06-28 MED ORDER — LACTATED RINGERS IV BOLUS
1000.0000 mL | Freq: Once | INTRAVENOUS | Status: AC
  Administered 2018-06-28: 1000 mL via INTRAVENOUS

## 2018-06-28 MED ORDER — FENTANYL CITRATE (PF) 100 MCG/2ML IJ SOLN
50.0000 ug | Freq: Once | INTRAMUSCULAR | Status: AC
  Administered 2018-06-28: 50 ug via INTRAVENOUS
  Filled 2018-06-28: qty 2

## 2018-06-28 MED ORDER — LIDOCAINE 5 % EX PTCH: 1.0000 | MEDICATED_PATCH | CUTANEOUS | 0 refills | Status: AC

## 2018-06-28 NOTE — ED Notes (Signed)
Patient transported to CT 

## 2018-06-28 NOTE — ED Notes (Signed)
Pt states she can not provide urine specimen at this time °

## 2018-06-28 NOTE — ED Notes (Signed)
Patient left without RN being able to attain vitals or signature. Patient was very disappointed at RN due to not receiving more pain medication.

## 2018-06-28 NOTE — ED Triage Notes (Addendum)
Pt reports n/v/d x 3 days.  Pt reports left flank pain and abd pain.  Pt states that 2 days ago she that her foot slid out from under her while she was trying to get back to bed and fell.  Pt reports she hit her head on the nightstand.  Pt reports a headache and dizziness. Pt endorses sensitivity to light.  Pt a/o x 4.  Hx CVA, Kidney stones, and DM.  Pt has been unable to monitor for blood sugars since she lost her monitor.

## 2018-06-28 NOTE — ED Provider Notes (Signed)
[facility]-EMERGENCY DEPT Provider Note   CSN: [id number] Arrival date & time: 06/28/18  1205     History   Chief Complaint Chief Complaint  Patient presents with  . Abdominal Pain  . Fall    HPI Stephanie [name] is a 52 y.o. female.  The history is provided by the patient.  Abdominal Pain   This is a new problem. The current episode started more than 2 days ago. The problem occurs daily. The problem has not changed since onset.The pain is associated with an unknown factor. The pain is located in the generalized abdominal region. The quality of the pain is dull and aching. The pain is at a severity of 3/10. The pain is mild. Associated symptoms include diarrhea and headaches (fall yesterday). Pertinent negatives include anorexia, fever, flatus, melena, nausea, vomiting, constipation, dysuria, frequency, hematuria, arthralgias and myalgias. Nothing aggravates the symptoms. Nothing relieves the symptoms. Her past medical history does not include GERD or irritable bowel syndrome.    Past Medical History:  Diagnosis Date  . Diabetes mellitus without complication (HCC)   . Difficult intubation   . Diverticulitis   . Headache   . Hepatitis C    treated and resolved  . History of kidney stones   . Hypertension   . IBS (irritable bowel syndrome)   . Kidney stones   . Sciatica   . Sleep apnea    on cpap  . Stroke [facility]) 2017   memory loss    Patient Active Problem List   Diagnosis Date Noted  . Shoulder pain, right 12/22/2017  . UTI (urinary tract infection) 08/21/2017  . Nausea & vomiting 08/21/2017  . Nicotine dependence 08/21/2017  . Headache 01/07/2017  . Small bowel obstruction (HCC) 09/02/2016  . Pain and swelling of left upper extremity 07/22/2016  . Superficial venous thrombosis of arm, left 07/22/2016  . ICH (intracerebral hemorrhage) (HCC)   . Essential hypertension, malignant 04/19/2016  . Cytotoxic brain edema (HCC) 04/19/2016  . IVH  (intraventricular hemorrhage) (HCC) 04/18/2016  . Hepatitis C     Past Surgical History:  Procedure Laterality Date  . ABDOMINAL HYSTERECTOMY    . carpel tunnel syndrome  2017  . HARDWARE REMOVAL Right 12/22/2017   Procedure: HARDWARE REMOVAL-RIGHT ARM;  Surgeon: Lyndle Herrlich, MD;  Location: [facility] ORS;  Service: Orthopedics;  Laterality: Right;  Removal of implant, superficial right humerus  . HUMERUS IM NAIL Right 03/11/2017   Procedure: INTRAMEDULLARY (IM) NAIL HUMERAL;  Surgeon: Lyndle Herrlich, MD;  Location: [facility] ORS;  Service: Orthopedics;  Laterality: Right;  . KIDNEY STONE SURGERY Right 02/12/12  . KIDNEY STONE SURGERY Left 07/15/2012  . LIGATION OF ARTERIOVENOUS  FISTULA Left 06/21/2016   Procedure: LIGATION OF ARTERIOVENOUS  FISTULA ( LIGATION BASILIC VEIN );  Surgeon: Renford Dills, MD;  Location: [facility] ORS;  Service: Vascular;  Laterality: Left;  . LITHOTRIPSY    . OOPHORECTOMY    . SINUS EXPLORATION    . TONSILLECTOMY       OB History   None      Home Medications    Prior to Admission medications   Medication Sig Start Date End Date Taking? Authorizing Provider  alprazolam Prudy Feeler) 2 MG tablet Take 1 mg by mouth 2 (two) times daily.    Yes [provider]  amLODipine (NORVASC) 5 MG tablet Take 5 mg by mouth daily.  06/25/18  Yes [provider]  atorvastatin (LIPITOR) 40 MG tablet Take 40 mg  by mouth every evening.   Yes [provider]  CARTIA XT 180 MG 24 hr capsule Take 180 mg by mouth daily.  04/13/18  Yes [provider]  cephALEXin (KEFLEX) 500 MG capsule Take 1 capsule (500 mg total) by mouth 3 (three) times daily for 10 days. 06/25/18 07/05/18 Yes Schaevitz, Myra Rude, MD  cloNIDine (CATAPRES) 0.1 MG tablet Take 0.1 mg by mouth 2 (two) times daily.  06/20/18  Yes [provider]  clopidogrel (PLAVIX) 75 MG tablet Take 1 tablet (75 mg total) by mouth daily. 06/27/16  Yes Marvel Plan, MD  EMGALITY 120 MG/ML SOAJ  Inject 120-240 mg into the skin every 30 (thirty) days. Receive 240 mg 1st month then Receive 120 mg for the remaining months ahead 05/10/18  Yes [provider]  FLUoxetine (PROZAC) 40 MG capsule Take 40 mg by mouth daily.  06/28/18  Yes [provider]  glipiZIDE (GLUCOTROL) 5 MG tablet Take 1 tablet (5 mg total) by mouth 2 (two) times daily before a meal. 04/30/16  Yes Lora Paula, MD  levETIRAcetam (KEPPRA) 500 MG tablet Take 500 mg by mouth 2 (two) times daily. 02/16/18  Yes [provider]  losartan (COZAAR) 100 MG tablet Take 100 mg by mouth daily.  08/08/17  Yes [provider]  metoprolol succinate (TOPROL-XL) 50 MG 24 hr tablet Take 50 mg by mouth daily. 06/03/18  Yes [provider]  omeprazole (PRILOSEC) 40 MG capsule Take 40 mg by mouth daily.   Yes [provider]  pregabalin (LYRICA) 50 MG capsule Take 50 mg by mouth at bedtime. 04/30/18  Yes [provider]  SUNMARK LORATADINE 10 MG tablet Take 10 mg by mouth daily.  06/24/18  Yes [provider]  zolpidem (AMBIEN) 10 MG tablet Take 10 mg by mouth at bedtime.  08/14/17  Yes [provider]  lidocaine (LIDODERM) 5 % Place 1 patch onto the skin daily. Remove & Discard patch within 12 hours or as directed by MD 06/28/18 07/28/18  Virgina Norfolk, DO  promethazine (PHENERGAN) 25 MG tablet Take 25 mg by mouth every 6 (six) hours as needed for nausea or vomiting.  08/08/17   [provider]    Family History Family History  Problem Relation Age of Onset  . Heart failure Mother   . Diabetes Father   . Cancer Sister     Social History Social History   Tobacco Use  . Smoking status: Current Every Day Smoker    Packs/day: 1.00    Years: 1.50    Pack years: 1.50    Types: Cigarettes  . Smokeless tobacco: Never Used  Substance Use Topics  . Alcohol use: No    Comment: no alcohol since 2002  . Drug use: No    Comment: electric cig      Allergies   Gabapentin; Tylenol [acetaminophen]; Aspirin; Buprenorphine hcl; Codeine; Compazine; Ioxaglate; Ivp dye [iodinated diagnostic agents]; Metrizamide; Naproxen; Norco [hydrocodone-acetaminophen]; Penicillin g; Penicillins; Prochlorperazine maleate; Reglan [metoclopramide]; Sulfur; Tegretol [carbamazepine]; Toradol [ketorolac tromethamine]; Tramadol; and Zofran [ondansetron hcl]   Review of Systems Review of Systems  Constitutional: Negative for chills and fever.  HENT: Negative for ear pain and sore throat.   Eyes: Negative for pain and visual disturbance.  Respiratory: Negative for cough and shortness of breath.   Cardiovascular: Negative for chest pain and palpitations.  Gastrointestinal: Positive for abdominal pain and diarrhea. Negative for anorexia, constipation, flatus, melena, nausea and vomiting.  Genitourinary: Negative for dysuria,  frequency and hematuria.  Musculoskeletal: Negative for arthralgias, back pain and myalgias.  Skin: Negative for color change and rash.  Neurological: Positive for headaches (fall yesterday). Negative for seizures and syncope.  All other systems reviewed and are negative.    Physical Exam Updated Vital Signs  ED Triage Vitals  Enc Vitals Group     BP 06/28/18 1218 (!) 149/98     Pulse Rate 06/28/18 1218 63     Resp 06/28/18 1218 18     Temp 06/28/18 1218 97.9 F (36.6 C)     Temp Source 06/28/18 1218 Oral     SpO2 06/28/18 1218 100 %     Weight 06/28/18 1219 195 lb (88.5 kg)     Height 06/28/18 1219 5\' 2"  (1.575 m)     Head Circumference --      Peak Flow --      Pain Score 06/28/18 1219 9     Pain Loc --      Pain Edu? --      Excl. in GC? --     Physical Exam  Constitutional: She is oriented to person, place, and time. She appears well-developed and well-nourished. No distress.  HENT:  Head: Normocephalic and atraumatic.  Eyes: Pupils are equal, round, and reactive to light. Conjunctivae and EOM are normal.  Neck:  Neck supple.  Cardiovascular: Normal rate, regular rhythm and normal heart sounds.  No murmur heard. Pulmonary/Chest: Effort normal and breath sounds normal. No respiratory distress.  Abdominal: Soft. Bowel sounds are normal. There is generalized tenderness. There is no rigidity, no rebound, no CVA tenderness, no tenderness at McBurney's point and negative Murphy's sign.  Musculoskeletal: She exhibits no edema.  Neurological: She is alert and oriented to person, place, and time.  5+/5 strength, normal sensation, no drift  Skin: Skin is warm and dry. Capillary refill takes less than 2 seconds.  Psychiatric: She has a normal mood and affect.  Nursing note and vitals reviewed.    ED Treatments / Results  Labs (all labs ordered are listed, but only abnormal results are displayed) Labs Reviewed  CBC WITH DIFFERENTIAL/PLATELET  COMPREHENSIVE METABOLIC PANEL  LIPASE, BLOOD    EKG None  Radiology Ct Abdomen Pelvis Wo Contrast  Result Date: 06/28/2018 CLINICAL DATA:  Acute right-sided abdominal pain. EXAM: CT ABDOMEN AND PELVIS WITHOUT CONTRAST TECHNIQUE: Multidetector CT imaging of the abdomen and pelvis was performed following the standard protocol without IV contrast. COMPARISON:  CT scan of June 25, 2018. FINDINGS: Lower chest: No acute abnormality. Hepatobiliary: No focal liver abnormality is seen. No gallstones, gallbladder wall thickening, or biliary dilatation. Pancreas: Unremarkable. No pancreatic ductal dilatation or surrounding inflammatory changes. Spleen: Normal in size without focal abnormality. Adrenals/Urinary Tract: Adrenal glands appear normal. Right renal atrophy and cortical scarring is noted. Nonobstructive right nephrolithiasis is noted. No hydronephrosis or renal obstruction is noted. Urinary bladder is unremarkable. Stomach/Bowel: Stomach is within normal limits. Appendix appears normal. No evidence of bowel wall thickening, distention, or inflammatory changes.  Vascular/Lymphatic: Aortic atherosclerosis. No enlarged abdominal or pelvic lymph nodes. Reproductive: Status post hysterectomy. No adnexal masses. Other: No abdominal wall hernia or abnormality. No abdominopelvic ascites. Musculoskeletal: No acute or significant osseous findings. IMPRESSION: Nonobstructive right nephrolithiasis. No hydronephrosis or renal obstruction is noted. Right renal atrophy and cortical scarring is noted. Aortic Atherosclerosis (ICD10-I70.0). Electronically Signed   By: Lupita Raider, M.D.   On: 06/28/2018 15:18   Ct Head Wo Contrast  Result Date: 06/28/2018 CLINICAL DATA:  Minor head trauma. EXAM: CT HEAD WITHOUT CONTRAST TECHNIQUE: Contiguous axial images were obtained from the base of the skull through the vertex without intravenous contrast. COMPARISON:  06/26/2018 and 10/13/2017 FINDINGS: Brain: Ventricles, cisterns and other CSF spaces are within normal. 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 No evidence of mass, mass effect, shift of midline structures or acute hemorrhage. No acute infarction. Vascular: No hyperdense vessel or unexpected calcification. Skull: Normal. Negative for fracture or focal lesion. Sinuses/Orbits: No acute finding. Other: None. IMPRESSION: No acute findings. 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 Electronically Signed   By: Elberta Fortis M.D.   On: 06/28/2018 15:11    Procedures Procedures (including critical care time)  Medications Ordered in ED Medications  lactated ringers bolus 1,000 mL (1,000 mLs Intravenous New Bag/Given 06/28/18 1257)  fentaNYL (SUBLIMAZE) injection 50 mcg (50 mcg Intravenous Given 06/28/18 1258)     Initial Impression / Assessment and Plan / ED Course  I have reviewed the triage vital signs and the nursing notes.  Pertinent labs & imaging results that were available during my care of the patient were reviewed by me and considered in my medical decision making (see chart for details).     JOSSLYNN MENTZER is a 52 year old female history of diabetes, hepatitis C, kidney stones, irritable bowel syndrome who presents the ED with multiple complaints.  Patient with normal vitals.  No fever.  Patient states that she has a headache after falling and hitting her head yesterday.  Patient with diarrhea but no nausea, no vomiting.  No urinary symptoms.  Has diffuse abdominal pain on exam.  Has normal neurological exam.  Had similar episode at outside hospital several days ago.  Concern for polysubstance abuse.  There was a concern that patient missed uses her Xanax which is leading to her falls.  Patient overall is well-appearing.  She is currently on Keflex for urinary tract infection which she has been taking for the last 2 days.  She does not have any urinary symptoms.  Urine culture performed at that ED visit 2 days ago shows pan sensitivity.  Will obtain lab work, CT of the head, abdomen, pelvis.  Patient given IV fluids, IV fentanyl.  Suspect patient with diarrhea from a viral process.  May be side effect from antibiotic.  Does have a history of irritable bowel syndrome as well.  Lab work shows no significant anemia, lateral abnormality, kidney injury.  CT of the head was unremarkable.  CT of the abdomen and pelvis showed no acute processes.  Patient likely with diarrhea from viral process.  Does not have any high risk features.  Patient was requesting more IV pain medicine but at this time there is no organic reason for treatment.  Patient discharged from ED in good condition.  Understands return precautions and recommend follow-up with primary care doctor. Given rx for zofran.   This chart was dictated using voice recognition software.  Despite best efforts to proofread,  errors can occur which can change the documentation meaning.   Final Clinical Impressions(s) / ED Diagnoses   Final diagnoses:  Generalized abdominal pain  Diarrhea, unspecified type    ED Discharge Orders         Ordered     lidocaine (LIDODERM) 5 %  Every 24 hours     06/28/18 1523           Virgina Norfolk, DO 06/28/18 1524

## 2018-06-29 NOTE — Progress Notes (Signed)
ED Antimicrobial Stewardship Positive Culture Follow Up   Kalianna Verbeke Vensel is an 52 y.o. female who presented to [facility] on 06/25/2018 with a chief complaint of Chief Complaint  Patient presents with  . Dizziness    Recent Results (from the past 720 hour(s))  Urine Culture     Status: Abnormal   Collection Time: 06/25/18  4:39 PM  Result Value Ref Range Status   Specimen Description   Final    URINE, RANDOM Performed at [facility], [address], [place], [place] [address]    Special Requests   Final    NONE Performed at [facility], [address] [place], [place] [address]    Culture >=100,000 COLONIES/mL ENTEROCOCCUS FAECALIS (A)  Final   Report Status 06/28/2018 FINAL  Final   Organism ID, Bacteria ENTEROCOCCUS FAECALIS (A)  Final      Susceptibility   Enterococcus faecalis - MIC*    AMPICILLIN <=2 SENSITIVE Sensitive     LEVOFLOXACIN 0.5 SENSITIVE Sensitive     NITROFURANTOIN <=16 SENSITIVE Sensitive     VANCOMYCIN 1 SENSITIVE Sensitive     * >=100,000 COLONIES/mL ENTEROCOCCUS FAECALIS    [x]  Treated with Keflex 500 mg TID, organism resistant to prescribed antimicrobial []  Patient discharged originally without antimicrobial agent and treatment is now indicated  New antibiotic prescription: Nitrofurantoin 100 mg BID for 5 days  ED Provider: Dr. Charlett Blake 06/29/2018, 4:56 PM Clinical Pharmacist

## 2018-07-22 ENCOUNTER — Encounter (HOSPITAL_COMMUNITY): Payer: Self-pay

## 2018-07-22 ENCOUNTER — Emergency Department (HOSPITAL_COMMUNITY): Payer: No Typology Code available for payment source

## 2018-07-22 ENCOUNTER — Other Ambulatory Visit: Payer: Self-pay

## 2018-07-22 ENCOUNTER — Emergency Department (HOSPITAL_COMMUNITY)
Admission: EM | Admit: 2018-07-22 | Discharge: 2018-07-22 | Disposition: A | Discharge status: Home / Self Care | Payer: No Typology Code available for payment source | Attending: Emergency Medicine | Admitting: Emergency Medicine

## 2018-07-22 DIAGNOSIS — Z79899 Other long term (current) drug therapy: Secondary | ICD-10-CM | POA: Insufficient documentation

## 2018-07-22 DIAGNOSIS — I10 Essential (primary) hypertension: Secondary | ICD-10-CM | POA: Insufficient documentation

## 2018-07-22 DIAGNOSIS — G43909 Migraine, unspecified, not intractable, without status migrainosus: Secondary | ICD-10-CM | POA: Insufficient documentation

## 2018-07-22 DIAGNOSIS — F17210 Nicotine dependence, cigarettes, uncomplicated: Secondary | ICD-10-CM | POA: Insufficient documentation

## 2018-07-22 DIAGNOSIS — E119 Type 2 diabetes mellitus without complications: Secondary | ICD-10-CM | POA: Diagnosis not present

## 2018-07-22 DIAGNOSIS — R197 Diarrhea, unspecified: Secondary | ICD-10-CM | POA: Diagnosis not present

## 2018-07-22 DIAGNOSIS — R112 Nausea with vomiting, unspecified: Secondary | ICD-10-CM | POA: Diagnosis present

## 2018-07-22 LAB — CBC WITH DIFFERENTIAL/PLATELET
Abs Immature Granulocytes: 0.04 10*3/uL (ref 0.00–0.07)
Basophils Absolute: 0.1 10*3/uL (ref 0.0–0.1)
Basophils Relative: 1 %
EOS PCT: 1 %
Eosinophils Absolute: 0.1 10*3/uL (ref 0.0–0.5)
HEMATOCRIT: 43.7 % (ref 36.0–46.0)
HEMOGLOBIN: 14.2 g/dL (ref 12.0–15.0)
Immature Granulocytes: 0 %
LYMPHS PCT: 39 %
Lymphs Abs: 3.5 10*3/uL (ref 0.7–4.0)
MCH: 31.1 pg (ref 26.0–34.0)
MCHC: 32.5 g/dL (ref 30.0–36.0)
MCV: 95.6 fL (ref 80.0–100.0)
MONO ABS: 0.9 10*3/uL (ref 0.1–1.0)
MONOS PCT: 10 %
Neutro Abs: 4.5 10*3/uL (ref 1.7–7.7)
Neutrophils Relative %: 49 %
Platelets: 210 10*3/uL (ref 150–400)
RBC: 4.57 MIL/uL (ref 3.87–5.11)
RDW: 13.3 % (ref 11.5–15.5)
WBC: 9 10*3/uL (ref 4.0–10.5)
nRBC: 0 % (ref 0.0–0.2)

## 2018-07-22 LAB — COMPREHENSIVE METABOLIC PANEL
ALK PHOS: 115 U/L (ref 38–126)
ALT: 19 U/L (ref 0–44)
AST: 22 U/L (ref 15–41)
Albumin: 3.9 g/dL (ref 3.5–5.0)
Anion gap: 11 (ref 5–15)
BUN: 9 mg/dL (ref 6–20)
CALCIUM: 9.4 mg/dL (ref 8.9–10.3)
CO2: 26 mmol/L (ref 22–32)
CREATININE: 0.69 mg/dL (ref 0.44–1.00)
Chloride: 106 mmol/L (ref 98–111)
GFR calc non Af Amer: >60 mL/min (ref >60)
GLUCOSE: 99 mg/dL (ref 70–99)
Potassium: 3.8 mmol/L (ref 3.5–5.1)
SODIUM: 143 mmol/L (ref 135–145)
Total Bilirubin: 0.5 mg/dL (ref 0.3–1.2)
Total Protein: 7.5 g/dL (ref 6.5–8.1)

## 2018-07-22 LAB — LIPASE, BLOOD: Lipase: 26 U/L (ref 11–51)

## 2018-07-22 MED ORDER — ACETAMINOPHEN 500 MG PO TABS
1000.0000 mg | ORAL_TABLET | Freq: Once | ORAL | Status: DC
  Filled 2018-07-22: qty 2

## 2018-07-22 MED ORDER — SODIUM CHLORIDE 0.9 % IV BOLUS
1000.0000 mL | Freq: Once | INTRAVENOUS | Status: AC
  Administered 2018-07-22: 1000 mL via INTRAVENOUS

## 2018-07-22 MED ORDER — ONDANSETRON HCL 4 MG/2ML IJ SOLN
4.0000 mg | Freq: Once | INTRAMUSCULAR | Status: AC
  Administered 2018-07-22: 4 mg via INTRAVENOUS
  Filled 2018-07-22: qty 2

## 2018-07-22 MED ORDER — DIPHENHYDRAMINE HCL 50 MG/ML IJ SOLN
25.0000 mg | Freq: Once | INTRAMUSCULAR | Status: AC
  Administered 2018-07-22: 25 mg via INTRAVENOUS
  Filled 2018-07-22: qty 1

## 2018-07-22 MED ORDER — MORPHINE SULFATE (PF) 2 MG/ML IV SOLN
2.0000 mg | Freq: Once | INTRAVENOUS | Status: AC
  Administered 2018-07-22: 2 mg via INTRAVENOUS
  Filled 2018-07-22: qty 1

## 2018-07-22 NOTE — Discharge Instructions (Addendum)
Please return for any problem. Follow up with your regular physician as instructed.  °

## 2018-07-22 NOTE — ED Notes (Signed)
Patient transported to X-ray 

## 2018-07-22 NOTE — ED Triage Notes (Signed)
Pt reports headache, abd pain, and N/V/D x3 days. Pt is currently stable and not vomiting in triage.

## 2018-07-22 NOTE — ED Provider Notes (Signed)
[facility]-EMERGENCY DEPT Provider Note   CSN: [id number] Arrival date & time: 07/22/18  1533     History   Chief Complaint Chief Complaint  Patient presents with  . Abdominal Pain  . Headache    HPI Stephanie [name] is a 52 y.o. female.  53 year old female with prior medical history as detailed below presents for evaluation of nausea, vomiting, diarrhea, and headache.  Patient reports that 3 days ago she started to have diarrhea and associated nausea and vomiting.  She does report sick contacts at home with similar symptoms.  Over the last 2 days the nausea, vomiting, and diarrhea have persisted.  She developed a headache over the last 24 hours.  She took some Excedrin yesterday for headache.  She has not taken anything today for her headache.  She requests a dose of morphine for her headache.  She denies fever.  She denies abdominal pain.  Patient reports that her grandson at home has very similar symptoms with nausea, vomiting, diarrhea.  The history is provided by the patient and medical records.  Diarrhea   This is a new problem. The current episode started more than 2 days ago. The problem occurs 2 to 4 times per day. The problem has not changed since onset.The stool consistency is described as watery. There has been no fever. Associated symptoms include headaches. Pertinent negatives include no abdominal pain. She has tried nothing for the symptoms. The treatment provided no relief.    Past Medical History:  Diagnosis Date  . Diabetes mellitus without complication (HCC)   . Difficult intubation   . Diverticulitis   . Headache   . Hepatitis C    treated and resolved  . History of kidney stones   . Hypertension   . IBS (irritable bowel syndrome)   . Kidney stones   . Sciatica   . Sleep apnea    on cpap  . Stroke [facility]) 2017   memory loss    Patient Active Problem List   Diagnosis Date Noted  . Shoulder pain, right 12/22/2017  . UTI  (urinary tract infection) 08/21/2017  . Nausea & vomiting 08/21/2017  . Nicotine dependence 08/21/2017  . Headache 01/07/2017  . Small bowel obstruction (HCC) 09/02/2016  . Pain and swelling of left upper extremity 07/22/2016  . Superficial venous thrombosis of arm, left 07/22/2016  . ICH (intracerebral hemorrhage) (HCC)   . Essential hypertension, malignant 04/19/2016  . Cytotoxic brain edema (HCC) 04/19/2016  . IVH (intraventricular hemorrhage) (HCC) 04/18/2016  . Hepatitis C     Past Surgical History:  Procedure Laterality Date  . ABDOMINAL HYSTERECTOMY    . carpel tunnel syndrome  2017  . HARDWARE REMOVAL Right 12/22/2017   Procedure: HARDWARE REMOVAL-RIGHT ARM;  Surgeon: Lyndle Herrlich, MD;  Location: [facility] ORS;  Service: Orthopedics;  Laterality: Right;  Removal of implant, superficial right humerus  . HUMERUS IM NAIL Right 03/11/2017   Procedure: INTRAMEDULLARY (IM) NAIL HUMERAL;  Surgeon: Lyndle Herrlich, MD;  Location: [facility] ORS;  Service: Orthopedics;  Laterality: Right;  . KIDNEY STONE SURGERY Right 02/12/12  . KIDNEY STONE SURGERY Left 07/15/2012  . LIGATION OF ARTERIOVENOUS  FISTULA Left 06/21/2016   Procedure: LIGATION OF ARTERIOVENOUS  FISTULA ( LIGATION BASILIC VEIN );  Surgeon: Renford Dills, MD;  Location: [facility] ORS;  Service: Vascular;  Laterality: Left;  . LITHOTRIPSY    . OOPHORECTOMY    . SINUS EXPLORATION    . TONSILLECTOMY  OB History   None      Home Medications    Prior to Admission medications   Medication Sig Start Date End Date Taking? Authorizing Provider  alprazolam Prudy Feeler(XANAX) 2 MG tablet Take 1 mg by mouth 2 (two) times daily.     [provider]  amLODipine (NORVASC) 5 MG tablet Take 5 mg by mouth daily.  06/25/18   [provider]  atorvastatin (LIPITOR) 40 MG tablet Take 40 mg by mouth every evening.    [provider]  CARTIA XT 180 MG 24 hr capsule Take 180 mg by mouth daily.  04/13/18   [provider]   cloNIDine (CATAPRES) 0.1 MG tablet Take 0.1 mg by mouth 2 (two) times daily.  06/20/18   [provider]  clopidogrel (PLAVIX) 75 MG tablet Take 1 tablet (75 mg total) by mouth daily. 06/27/16   Marvel PlanXu, Jindong, MD  EMGALITY 120 MG/ML SOAJ Inject 120-240 mg into the skin every 30 (thirty) days. Receive 240 mg 1st month then Receive 120 mg for the remaining months ahead 05/10/18   [provider]  FLUoxetine (PROZAC) 40 MG capsule Take 40 mg by mouth daily.  06/28/18   [provider]  glipiZIDE (GLUCOTROL) 5 MG tablet Take 1 tablet (5 mg total) by mouth 2 (two) times daily before a meal. 04/30/16   Lora PaulaKrall, Jennifer T, MD  levETIRAcetam (KEPPRA) 500 MG tablet Take 500 mg by mouth 2 (two) times daily. 02/16/18   [provider]  lidocaine (LIDODERM) 5 % Place 1 patch onto the skin daily. Remove & Discard patch within 12 hours or as directed by MD 06/28/18 07/28/18  Curatolo, Adam, DO  losartan (COZAAR) 100 MG tablet Take 100 mg by mouth daily.  08/08/17   [provider]  metoprolol succinate (TOPROL-XL) 50 MG 24 hr tablet Take 50 mg by mouth daily. 06/03/18   [provider]  omeprazole (PRILOSEC) 40 MG capsule Take 40 mg by mouth daily.    [provider]  pregabalin (LYRICA) 50 MG capsule Take 50 mg by mouth at bedtime. 04/30/18   [provider]  promethazine (PHENERGAN) 25 MG tablet Take 25 mg by mouth every 6 (six) hours as needed for nausea or vomiting.  08/08/17   [provider]  SUNMARK LORATADINE 10 MG tablet Take 10 mg by mouth daily.  06/24/18   [provider]  zolpidem (AMBIEN) 10 MG tablet Take 10 mg by mouth at bedtime.  08/14/17   [provider]    Family History Family History  Problem Relation Age of Onset  . Heart failure Mother   . Diabetes Father   . Cancer Sister     Social History Social History   Tobacco Use  . Smoking status: Current Every Day Smoker    Packs/day: 1.00     Years: 1.50    Pack years: 1.50    Types: Cigarettes  . Smokeless tobacco: Never Used  Substance Use Topics  . Alcohol use: No    Comment: no alcohol since 2002  . Drug use: No    Comment: electric cig     Allergies   Gabapentin; Tylenol [acetaminophen]; Aspirin; Buprenorphine hcl; Codeine; Compazine; Ioxaglate; Ivp dye [iodinated diagnostic agents]; Metrizamide; Naproxen; Norco [hydrocodone-acetaminophen]; Penicillin g; Penicillins; Prochlorperazine maleate; Reglan [metoclopramide]; Sulfur; Tegretol [carbamazepine]; Toradol [ketorolac tromethamine]; Tramadol; and Zofran [ondansetron hcl]   Review of Systems Review of Systems  Gastrointestinal: Positive for diarrhea. Negative for abdominal pain.  Neurological: Positive for  headaches.  All other systems reviewed and are negative.    Physical Exam Updated Vital Signs BP (!) 145/106 (BP Location: Left Arm)   Pulse 92   Temp 97.7 F (36.5 C) (Oral)   Resp 18   Ht 5\' 2"  (1.575 m)   Wt 88.5 kg   SpO2 99%   BMI 35.69 kg/m   Physical Exam  Constitutional: She is oriented to person, place, and time. She appears well-developed and well-nourished. No distress.  HENT:  Head: Normocephalic and atraumatic.  Mouth/Throat: Oropharynx is clear and moist.  Eyes: Pupils are equal, round, and reactive to light. Conjunctivae and EOM are normal.  Neck: Normal range of motion. Neck supple.  Cardiovascular: Normal rate, regular rhythm and normal heart sounds.  Pulmonary/Chest: Effort normal and breath sounds normal. No respiratory distress.  Abdominal: Soft. Normal appearance. She exhibits no distension. There is no tenderness.  Musculoskeletal: Normal range of motion. She exhibits no edema or deformity.  Neurological: She is alert and oriented to person, place, and time.  Alert and oriented x3 No facial droop Normal speech 5 out of 5 strength in both upper and lower extremities. VAN negative  Normal Gait   Skin: Skin is warm and dry.    Psychiatric: She has a normal mood and affect.  Nursing note and vitals reviewed.    ED Treatments / Results  Labs (all labs ordered are listed, but only abnormal results are displayed) Labs Reviewed  COMPREHENSIVE METABOLIC PANEL  LIPASE, BLOOD  CBC WITH DIFFERENTIAL/PLATELET  URINALYSIS, ROUTINE W REFLEX MICROSCOPIC    EKG None  Radiology Ct Head Wo Contrast  Result Date: 07/22/2018 CLINICAL DATA:  Headache, nausea, vomiting and dizziness x3 days. EXAM: CT HEAD WITHOUT CONTRAST TECHNIQUE: Contiguous axial images were obtained from the base of the skull through the vertex without intravenous contrast. COMPARISON:  06/28/2018 FINDINGS: Brain: Redemonstration of remote left posterior parietal infarct with encephalomalacia. No acute intracranial hemorrhage, acute appearing large vascular territory infarct, intra-axial mass nor extra-axial fluid. Midline fourth ventricle and basal cisterns without effacement. The cerebellum and brainstem are nonacute in appearance. Vascular: No hyperdense vessel sign. 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 No acute intracranial abnormality. Electronically Signed   By: Tollie Eth M.D.   On: 07/22/2018 18:18    Procedures Procedures (including critical care time)  Medications Ordered in ED Medications  sodium chloride 0.9 % bolus 1,000 mL (has no administration in time range)  ondansetron (ZOFRAN) injection 4 mg (has no administration in time range)  acetaminophen (TYLENOL) tablet 1,000 mg (has no administration in time range)     Initial Impression / Assessment and Plan / ED Course  I have reviewed the triage vital signs and the nursing notes.  Pertinent labs & imaging results that were available during my care of the patient were reviewed by me and considered in my medical decision making (see chart for details).     MDM  Screen complete   Patient  is presenting for evaluation of nausea, vomiting, diarrhea and associated headache.  Symptoms are most likely viral in nature.  Screening exam, labs, and CT do not suggest other acute pathology.   Patient appears improved following her.  Of treatment in the ED. The patient's multiple listed allergies complicated her care.   She now desires discharge home.  Importance of close follow-up was stressed.  Strict return precautions given and understood.  Of note, patient did repeatedly asked for narcotics  for her headache.  She repeatedly asked for morphine, fentanyl, and Dilaudid.  I did advise the patient on multiple occasions that the use of narcotics was not indicated for the treatment of migraines.  She is strongly encouraged to follow-up with her regular care providers.  Final Clinical Impressions(s) / ED Diagnoses   Final diagnoses:  Migraine without status migrainosus, not intractable, unspecified migraine type    ED Discharge Orders    None       Wynetta Fines, MD 07/22/18 2059

## 2018-07-22 NOTE — ED Notes (Signed)
RN went to d/c patient, patient angry about not receiving more morphine and said she still has a migraine.

## 2018-07-22 NOTE — ED Notes (Signed)
Pt came up to talk to me about wait time, alert and oriented x 3, clear speech noted, moves all extremities, steady gait noted.

## 2018-07-22 NOTE — ED Notes (Signed)
Dr Rodena MedinMessick aware pt requesting narcotic for pain and asked to go speak with pt.

## 2018-08-21 ENCOUNTER — Inpatient Hospital Stay (HOSPITAL_COMMUNITY): Payer: No Typology Code available for payment source

## 2018-08-21 ENCOUNTER — Emergency Department (HOSPITAL_COMMUNITY): Payer: No Typology Code available for payment source

## 2018-08-21 ENCOUNTER — Ambulatory Visit (HOSPITAL_COMMUNITY)
Admission: EM | Admit: 2018-08-21 | Discharge: 2018-08-21 | Disposition: A | Discharge status: Home / Self Care | Payer: PRIVATE HEALTH INSURANCE

## 2018-08-21 ENCOUNTER — Inpatient Hospital Stay (HOSPITAL_COMMUNITY)
Admission: EM | Admit: 2018-08-21 | Discharge: 2018-08-25 | DRG: 091 | Disposition: A | Discharge status: Home Health | Payer: No Typology Code available for payment source | Source: Ambulatory Visit | Attending: Internal Medicine | Admitting: Internal Medicine

## 2018-08-21 DIAGNOSIS — I959 Hypotension, unspecified: Secondary | ICD-10-CM | POA: Diagnosis present

## 2018-08-21 DIAGNOSIS — T43225A Adverse effect of selective serotonin reuptake inhibitors, initial encounter: Secondary | ICD-10-CM | POA: Diagnosis present

## 2018-08-21 DIAGNOSIS — X58XXXA Exposure to other specified factors, initial encounter: Secondary | ICD-10-CM | POA: Diagnosis present

## 2018-08-21 DIAGNOSIS — E119 Type 2 diabetes mellitus without complications: Secondary | ICD-10-CM | POA: Diagnosis present

## 2018-08-21 DIAGNOSIS — E872 Acidosis: Secondary | ICD-10-CM | POA: Diagnosis present

## 2018-08-21 DIAGNOSIS — J9622 Acute and chronic respiratory failure with hypercapnia: Secondary | ICD-10-CM | POA: Diagnosis present

## 2018-08-21 DIAGNOSIS — E860 Dehydration: Secondary | ICD-10-CM | POA: Diagnosis present

## 2018-08-21 DIAGNOSIS — Z8249 Family history of ischemic heart disease and other diseases of the circulatory system: Secondary | ICD-10-CM

## 2018-08-21 DIAGNOSIS — R001 Bradycardia, unspecified: Secondary | ICD-10-CM | POA: Diagnosis present

## 2018-08-21 DIAGNOSIS — Z6841 Body Mass Index (BMI) 40.0 and over, adult: Secondary | ICD-10-CM | POA: Diagnosis not present

## 2018-08-21 DIAGNOSIS — E785 Hyperlipidemia, unspecified: Secondary | ICD-10-CM | POA: Diagnosis present

## 2018-08-21 DIAGNOSIS — I440 Atrioventricular block, first degree: Secondary | ICD-10-CM | POA: Diagnosis present

## 2018-08-21 DIAGNOSIS — G9389 Other specified disorders of brain: Secondary | ICD-10-CM | POA: Diagnosis present

## 2018-08-21 DIAGNOSIS — R319 Hematuria, unspecified: Secondary | ICD-10-CM

## 2018-08-21 DIAGNOSIS — J9690 Respiratory failure, unspecified, unspecified whether with hypoxia or hypercapnia: Secondary | ICD-10-CM

## 2018-08-21 DIAGNOSIS — M4050 Lordosis, unspecified, site unspecified: Secondary | ICD-10-CM | POA: Diagnosis present

## 2018-08-21 DIAGNOSIS — G8929 Other chronic pain: Secondary | ICD-10-CM | POA: Diagnosis present

## 2018-08-21 DIAGNOSIS — R296 Repeated falls: Secondary | ICD-10-CM | POA: Diagnosis present

## 2018-08-21 DIAGNOSIS — N200 Calculus of kidney: Secondary | ICD-10-CM | POA: Diagnosis present

## 2018-08-21 DIAGNOSIS — Z833 Family history of diabetes mellitus: Secondary | ICD-10-CM

## 2018-08-21 DIAGNOSIS — G92 Toxic encephalopathy: Secondary | ICD-10-CM | POA: Diagnosis present

## 2018-08-21 DIAGNOSIS — G9340 Encephalopathy, unspecified: Secondary | ICD-10-CM | POA: Diagnosis not present

## 2018-08-21 DIAGNOSIS — F17210 Nicotine dependence, cigarettes, uncomplicated: Secondary | ICD-10-CM | POA: Diagnosis present

## 2018-08-21 DIAGNOSIS — J9621 Acute and chronic respiratory failure with hypoxia: Secondary | ICD-10-CM | POA: Diagnosis present

## 2018-08-21 DIAGNOSIS — Z87442 Personal history of urinary calculi: Secondary | ICD-10-CM

## 2018-08-21 DIAGNOSIS — K5730 Diverticulosis of large intestine without perforation or abscess without bleeding: Secondary | ICD-10-CM | POA: Diagnosis present

## 2018-08-21 DIAGNOSIS — S20229A Contusion of unspecified back wall of thorax, initial encounter: Secondary | ICD-10-CM | POA: Diagnosis present

## 2018-08-21 DIAGNOSIS — J449 Chronic obstructive pulmonary disease, unspecified: Secondary | ICD-10-CM | POA: Diagnosis present

## 2018-08-21 DIAGNOSIS — G4733 Obstructive sleep apnea (adult) (pediatric): Secondary | ICD-10-CM | POA: Diagnosis present

## 2018-08-21 DIAGNOSIS — N390 Urinary tract infection, site not specified: Secondary | ICD-10-CM | POA: Diagnosis present

## 2018-08-21 DIAGNOSIS — K760 Fatty (change of) liver, not elsewhere classified: Secondary | ICD-10-CM | POA: Diagnosis present

## 2018-08-21 DIAGNOSIS — K029 Dental caries, unspecified: Secondary | ICD-10-CM | POA: Diagnosis present

## 2018-08-21 DIAGNOSIS — N3000 Acute cystitis without hematuria: Secondary | ICD-10-CM | POA: Diagnosis not present

## 2018-08-21 DIAGNOSIS — E669 Obesity, unspecified: Secondary | ICD-10-CM | POA: Diagnosis present

## 2018-08-21 DIAGNOSIS — I10 Essential (primary) hypertension: Secondary | ICD-10-CM | POA: Diagnosis present

## 2018-08-21 DIAGNOSIS — Z8673 Personal history of transient ischemic attack (TIA), and cerebral infarction without residual deficits: Secondary | ICD-10-CM

## 2018-08-21 DIAGNOSIS — Z90710 Acquired absence of both cervix and uterus: Secondary | ICD-10-CM

## 2018-08-21 DIAGNOSIS — R4781 Slurred speech: Secondary | ICD-10-CM | POA: Diagnosis present

## 2018-08-21 DIAGNOSIS — Z809 Family history of malignant neoplasm, unspecified: Secondary | ICD-10-CM

## 2018-08-21 DIAGNOSIS — I9589 Other hypotension: Secondary | ICD-10-CM | POA: Diagnosis not present

## 2018-08-21 LAB — I-STAT ARTERIAL BLOOD GAS, ED
Acid-base deficit: 3 mmol/L — ABNORMAL HIGH (ref 0.0–2.0)
Bicarbonate: 25.9 mmol/L (ref 20.0–28.0)
O2 Saturation: 88 %
Patient temperature: 98.6
TCO2: 28 mmol/L (ref 22–32)
pCO2 arterial: 66.1 mmHg (ref 32.0–48.0)
pH, Arterial: 7.202 — ABNORMAL LOW (ref 7.350–7.450)
pO2, Arterial: 69 mmHg — ABNORMAL LOW (ref 83.0–108.0)

## 2018-08-21 LAB — I-STAT CG4 LACTIC ACID, ED
Lactic Acid, Venous: 2.36 mmol/L (ref 0.5–1.9)
Lactic Acid, Venous: 1.45 mmol/L (ref 0.5–1.9)

## 2018-08-21 LAB — COMPREHENSIVE METABOLIC PANEL
ALT: 20 U/L (ref 0–44)
AST: 27 U/L (ref 15–41)
Albumin: 3.6 g/dL (ref 3.5–5.0)
Alkaline Phosphatase: 112 U/L (ref 38–126)
Anion gap: 10 (ref 5–15)
BUN: 16 mg/dL (ref 6–20)
CO2: 23 mmol/L (ref 22–32)
Calcium: 7.1 mg/dL — ABNORMAL LOW (ref 8.9–10.3)
Chloride: 109 mmol/L (ref 98–111)
Creatinine, Ser: 1.09 mg/dL — ABNORMAL HIGH (ref 0.44–1.00)
GFR calc Af Amer: >60 mL/min (ref >60)
GFR calc non Af Amer: 58 mL/min — ABNORMAL LOW (ref >60)
Glucose, Bld: 119 mg/dL — ABNORMAL HIGH (ref 70–99)
Potassium: 6 mmol/L — ABNORMAL HIGH (ref 3.5–5.1)
Sodium: 142 mmol/L (ref 135–145)
Total Bilirubin: 0.7 mg/dL (ref 0.3–1.2)
Total Protein: 6.6 g/dL (ref 6.5–8.1)

## 2018-08-21 LAB — CBC WITH DIFFERENTIAL/PLATELET
Abs Immature Granulocytes: 0.03 10*3/uL (ref 0.00–0.07)
Basophils Absolute: 0 10*3/uL (ref 0.0–0.1)
Basophils Relative: 1 %
Eosinophils Absolute: 0.2 10*3/uL (ref 0.0–0.5)
Eosinophils Relative: 3 %
HCT: 42.5 % (ref 36.0–46.0)
Hemoglobin: 13 g/dL (ref 12.0–15.0)
Immature Granulocytes: 0 %
Lymphocytes Relative: 35 %
Lymphs Abs: 3 10*3/uL (ref 0.7–4.0)
MCH: 30 pg (ref 26.0–34.0)
MCHC: 30.6 g/dL (ref 30.0–36.0)
MCV: 97.9 fL (ref 80.0–100.0)
Monocytes Absolute: 0.8 10*3/uL (ref 0.1–1.0)
Monocytes Relative: 9 %
Neutro Abs: 4.5 10*3/uL (ref 1.7–7.7)
Neutrophils Relative %: 52 %
Platelets: 135 10*3/uL — ABNORMAL LOW (ref 150–400)
RBC: 4.34 MIL/uL (ref 3.87–5.11)
RDW: 14.5 % (ref 11.5–15.5)
WBC: 8.6 10*3/uL (ref 4.0–10.5)
nRBC: 0 % (ref 0.0–0.2)

## 2018-08-21 LAB — URINALYSIS, ROUTINE W REFLEX MICROSCOPIC
Bilirubin Urine: NEGATIVE
Glucose, UA: NEGATIVE mg/dL
Hgb urine dipstick: NEGATIVE
Ketones, ur: 5 mg/dL — AB
Nitrite: NEGATIVE
Protein, ur: 100 mg/dL — AB
Specific Gravity, Urine: 1.023 (ref 1.005–1.030)
WBC, UA: >50 WBC/hpf — ABNORMAL HIGH (ref 0–5)
pH: 5 (ref 5.0–8.0)

## 2018-08-21 LAB — BRAIN NATRIURETIC PEPTIDE: B Natriuretic Peptide: 45.9 pg/mL (ref 0.0–100.0)

## 2018-08-21 LAB — I-STAT BETA HCG BLOOD, ED (MC, WL, AP ONLY): I-stat hCG, quantitative: 7.2 m[IU]/mL — ABNORMAL HIGH (ref <5)

## 2018-08-21 LAB — CBG MONITORING, ED: Glucose-Capillary: 115 mg/dL — ABNORMAL HIGH (ref 70–99)

## 2018-08-21 LAB — PROTIME-INR
INR: 0.97
Prothrombin Time: 12.8 seconds (ref 11.4–15.2)

## 2018-08-21 LAB — POTASSIUM: Potassium: 4.8 mmol/L (ref 3.5–5.1)

## 2018-08-21 MED ORDER — MIDAZOLAM HCL 2 MG/2ML IJ SOLN
INTRAMUSCULAR | Status: AC
  Filled 2018-08-21: qty 2

## 2018-08-21 MED ORDER — PROPOFOL 1000 MG/100ML IV EMUL
0.0000 ug/kg/min | INTRAVENOUS | Status: DC
  Filled 2018-08-21: qty 100

## 2018-08-21 MED ORDER — IPRATROPIUM-ALBUTEROL 0.5-2.5 (3) MG/3ML IN SOLN
3.0000 mL | RESPIRATORY_TRACT | Status: DC
  Administered 2018-08-22 (×4): 3 mL via RESPIRATORY_TRACT
  Filled 2018-08-21 (×4): qty 3

## 2018-08-21 MED ORDER — MIDAZOLAM HCL 2 MG/2ML IJ SOLN: 2.0000 mg | INTRAMUSCULAR | Status: DC | PRN

## 2018-08-21 MED ORDER — FENTANYL CITRATE (PF) 100 MCG/2ML IJ SOLN
INTRAMUSCULAR | Status: AC | PRN
  Administered 2018-08-21: 100 ug via INTRAVENOUS

## 2018-08-21 MED ORDER — MIDAZOLAM HCL 2 MG/2ML IJ SOLN
2.0000 mg | INTRAMUSCULAR | Status: DC | PRN
  Administered 2018-08-22: 2 mg via INTRAVENOUS
  Filled 2018-08-21: qty 2

## 2018-08-21 MED ORDER — ATROPINE SULFATE 1 MG/ML IJ SOLN
INTRAMUSCULAR | Status: AC
  Filled 2018-08-21: qty 1

## 2018-08-21 MED ORDER — VANCOMYCIN HCL 10 G IV SOLR
1500.0000 mg | Freq: Once | INTRAVENOUS | Status: AC
  Administered 2018-08-22: 1500 mg via INTRAVENOUS
  Filled 2018-08-21: qty 1500

## 2018-08-21 MED ORDER — SODIUM CHLORIDE 0.9 % IV BOLUS
1000.0000 mL | Freq: Once | INTRAVENOUS | Status: AC
  Administered 2018-08-21: 1000 mL via INTRAVENOUS

## 2018-08-21 MED ORDER — SODIUM CHLORIDE 0.9 % IV SOLN
INTRAVENOUS | Status: AC | PRN
  Administered 2018-08-21 (×2): 1000 mL via INTRAVENOUS

## 2018-08-21 MED ORDER — FENTANYL CITRATE (PF) 100 MCG/2ML IJ SOLN
100.0000 ug | INTRAMUSCULAR | Status: DC | PRN
  Administered 2018-08-22: 100 ug via INTRAVENOUS
  Filled 2018-08-21: qty 2

## 2018-08-21 MED ORDER — ETOMIDATE 2 MG/ML IV SOLN
INTRAVENOUS | Status: AC | PRN
  Administered 2018-08-21: 20 mg via INTRAVENOUS

## 2018-08-21 MED ORDER — SODIUM CHLORIDE 0.9 % IV BOLUS: 1000.0000 mL | Freq: Once | INTRAVENOUS | Status: DC

## 2018-08-21 MED ORDER — FENTANYL CITRATE (PF) 100 MCG/2ML IJ SOLN
100.0000 ug | INTRAMUSCULAR | Status: DC | PRN
  Administered 2018-08-21 – 2018-08-22 (×2): 100 ug via INTRAVENOUS
  Filled 2018-08-21 (×4): qty 2

## 2018-08-21 MED ORDER — CHLORHEXIDINE GLUCONATE 0.12% ORAL RINSE (MEDLINE KIT)
15.0000 mL | Freq: Two times a day (BID) | OROMUCOSAL | Status: DC
  Administered 2018-08-22 – 2018-08-23 (×4): 15 mL via OROMUCOSAL

## 2018-08-21 MED ORDER — LACTATED RINGERS IV SOLN
INTRAVENOUS | Status: DC
  Administered 2018-08-22 (×2): via INTRAVENOUS

## 2018-08-21 MED ORDER — FAMOTIDINE 40 MG/5ML PO SUSR
20.0000 mg | Freq: Every day | ORAL | Status: DC
  Administered 2018-08-22 – 2018-08-23 (×2): 20 mg
  Filled 2018-08-21 (×2): qty 2.5

## 2018-08-21 MED ORDER — DOCUSATE SODIUM 50 MG/5ML PO LIQD
100.0000 mg | Freq: Two times a day (BID) | ORAL | Status: DC | PRN
  Filled 2018-08-21: qty 10

## 2018-08-21 MED ORDER — INSULIN ASPART 100 UNIT/ML ~~LOC~~ SOLN
0.0000 [IU] | SUBCUTANEOUS | Status: DC
  Administered 2018-08-22 (×2): 1 [IU] via SUBCUTANEOUS
  Administered 2018-08-22: 3 [IU] via SUBCUTANEOUS
  Administered 2018-08-22 – 2018-08-23 (×2): 2 [IU] via SUBCUTANEOUS
  Administered 2018-08-23 (×2): 1 [IU] via SUBCUTANEOUS
  Administered 2018-08-23: 2 [IU] via SUBCUTANEOUS
  Administered 2018-08-23: 1 [IU] via SUBCUTANEOUS

## 2018-08-21 MED ORDER — ORAL CARE MOUTH RINSE
15.0000 mL | OROMUCOSAL | Status: DC
  Administered 2018-08-22 (×6): 15 mL via OROMUCOSAL

## 2018-08-21 MED ORDER — ATROPINE SULFATE 1 MG/10ML IJ SOSY
0.5000 mg | PREFILLED_SYRINGE | Freq: Once | INTRAMUSCULAR | Status: AC
  Administered 2018-08-21: 0.5 mg via INTRAVENOUS

## 2018-08-21 MED ORDER — SODIUM CHLORIDE 0.9 % IV SOLN
1.0000 g | Freq: Once | INTRAVENOUS | Status: AC
  Administered 2018-08-21: 1 g via INTRAVENOUS
  Filled 2018-08-21: qty 1

## 2018-08-21 MED ORDER — ROCURONIUM BROMIDE 50 MG/5ML IV SOLN
INTRAVENOUS | Status: AC | PRN
  Administered 2018-08-21: 80 mg via INTRAVENOUS

## 2018-08-21 MED ORDER — NOREPINEPHRINE 4 MG/250ML-% IV SOLN
0.0000 ug/min | INTRAVENOUS | Status: DC
  Administered 2018-08-21: 2 ug/min via INTRAVENOUS
  Filled 2018-08-21: qty 250

## 2018-08-21 MED ORDER — BISACODYL 10 MG RE SUPP: 10.0000 mg | Freq: Every day | RECTAL | Status: DC | PRN

## 2018-08-21 NOTE — ED Notes (Signed)
Pt called for xray x4. No response.

## 2018-08-21 NOTE — ED Notes (Signed)
Pt transported to CT and then 65M

## 2018-08-21 NOTE — ED Notes (Signed)
EDP notified of pt arrival 

## 2018-08-21 NOTE — ED Notes (Signed)
Critical care at bedside  

## 2018-08-21 NOTE — ED Notes (Signed)
Intubation at 27 at the lip.

## 2018-08-21 NOTE — H&P (Signed)
NAME:  Stephanie [name], MRN:  [id number], DOB:  October 19, 1965, LOS: 1 ADMISSION DATE:  08/21/2018, CONSULTATION DATE:  08/21/2018 REFERRING MD:  Dr. Juleen China, CHIEF COMPLAINT:  AMS  Brief History   22 yoF presenting with slurred speech, multiple falls at home, and somnolence requiring intubation for airway protection and respiratory acidosis.  Additionally hypotensive despite 3L NS requiring low dose NE and bradycardic into the 40's. +UTI, CTH pending.   History of present illness   HPI obtained from medical chart review as patient is encephalopathic on mechanical ventilation. No family present at bedside.   52 year old female with history significant for tobacco abuse, DM, HTN, and prior stroke who presents from home with altered mental status.    Patient represented to urgent care with complaints of back pain, nausea, back pain and bruising, slurred speech and several falls at home.  Patient on plavix only.  Patient nor husband able to give clear history.  Unknown baseline or if residual affects from prior stroke.  She was noted to be lethargic and sent to [facility] ER for further eval.  In the ER, she was lethargic and sonorous.  ABG showed respiratory acidosis.  CXR noted for mild vascular congestion.  She was intubated and noted to be difficult airway.  Additionally, she was afebrile and hypotensive, requiring 3L NS and low dose peripheral levophed, and bradycardic in the 40's. Atropine given without change in HR.  EKG showing first degree AV block, abnormal labs noted for plts 135, LA 2.36 ->1.45, +UTI.  CT head / cervical pending.  PCCM called for admit.   Of note, on pharm review, pharmacy notes that patient had her prozac filled at 3 different pharmacies within days of each other this month.  Past Medical History  Tobacco abuse, DM, HTN, IBS, stroke, headache, treated Hep C, kidney stones, diverticulitis   Significant Hospital Events   12/13 Admitted  Consults:   Procedures:  12/13 ETT  (difficult intubation) >>  Significant Diagnostic Tests:  12/13 CTH/ cervical spine >> 12/13 CT abd/pelvis >>  Micro Data:  12/13 MRSA PCR >> 12/13 BCx2 >> 12/13 UC >>  Antimicrobials:  12/13 aztreonam >> 12/13 vanc >>  Interim history/subjective:   Objective   Blood pressure 95/70, pulse (!) 47, temperature 98 F (36.7 C), temperature source Oral, resp. rate 18, weight 88 kg, SpO2 98 %.    Vent Mode: PRVC FiO2 (%):  [40 %] 40 % Set Rate:  [18 bmp] 18 bmp Vt Set:  [550 mL] 550 mL PEEP:  [5 cmH20] 5 cmH20 Plateau Pressure:  [36 cmH20] 36 cmH20   Intake/Output Summary (Last 24 hours) at 08/22/2018 0059 Last data filed at 08/21/2018 2318 Gross per 24 hour  Intake 1000 ml  Output 200 ml  Net 800 ml   Filed Weights   08/21/18 2100  Weight: 88 kg   Examination: General:  Obese WF unresponsive on MV, NAD HEENT: MM pink/moist, ETT at 22, OGT, pupils 3/reactive, anicteric  Neuro: unresponsive CV: rr, 1deg AV block, no murmur, +1 distal pulses PULM: not breathing over set rate, diffuse rhonchi and wheeze GI: soft, +BS Extremities: cool /dry, no LE edema  Skin: no rashes, see picture of bruising to back   Resolved Hospital Problem list    Assessment & Plan:  Acute encephalopathy - reported falls and slurred speech - patient moving all extremities in ER, LSW would be 12/13 prior to bed - possibly related to UTI, but unclear etiology at this point -  ddx include intracranial process vs toxic/metabolic process vs ? Overdose/ prozac/  since patient refilled rx at 3 different pharmacies  P:  CT Head / cervical pending  Checking UDS, ETOH, tylenol/ salisylates, TSH  Minimize sedating meds- PAD with fentanyl/ versed pushes only as needed Hourly neuro exams  Acute respiratory failure Tobacco abuse  P:  Full MV support, PRVC 8cc/kg, rate 18 Wean FiO2/ PEEP for sats > 92% Recheck ABG and CXR (to check ETT positioning, initially right mainstem intubation since  retracted) duonebs q 4 hr Daily SBT  Hypotension - possibly related to sepsis although afebrile and normal WBC, could be dehydration but unclear hx of N/V - large bruising to back however Hgb is stable - s/p 3L fluid and weaning levophed - lactate normalized P:  Wean pressors for goal MAP 65 LR at 50 ml/hr Trend UOP Check PCT, CK Follow cultures Continue Azactam/ if MRSA neg, can d/c vanc F/u CT abd/pelvis If ongoing hypotension, consider TTE Trend BMET/ CBC  Bradycardia - first degree AV block on EKG, no prior hx of bradycardia.  Patient is on CCB and BB at home - Possible med related/ ?OD, but pressors requirements are improving, almost off P:  Tele monitoring Trend troponin and EKG Will change pressor to dopamine for beta affect  UTI P:  Send for UC Continue abx as above Awaiting CT to r/o obstruction/ stone  Hx DM P:  CBG q 4/ SSI  Hx HTN/ HLD P:  Hold home HTN meds for now  Best practice:  Diet: NPO Pain/Anxiety/Delirium protocol (if indicated): prn fentanyl/ versed VAP protocol (if indicated): yes DVT prophylaxis: SCDs only for now GI prophylaxis: pepcid  Glucose control: SSI Mobility: BR Code Status: Full  Family Communication: No family at bedside.  RN reports patient's husband left earlier. Disposition: ICU  Labs   CBC: Recent Labs  Lab 08/21/18 1845  WBC 8.6  NEUTROABS 4.5  HGB 13.0  HCT 42.5  MCV 97.9  PLT 135*    Basic Metabolic Panel: Recent Labs  Lab 08/21/18 1845 08/21/18 2138  NA 142  --   K 6.0* 4.8  CL 109  --   CO2 23  --   GLUCOSE 119*  --   BUN 16  --   CREATININE 1.09*  --   CALCIUM 7.1*  --    GFR: Estimated Creatinine Clearance: 61.5 mL/min (A) (by C-G formula based on SCr of 1.09 mg/dL (H)). Recent Labs  Lab 08/21/18 1845 08/21/18 1857 08/21/18 2139  WBC 8.6  --   --   LATICACIDVEN  --  2.36* 1.45    Liver Function Tests: Recent Labs  Lab 08/21/18 1845  AST 27  ALT 20  ALKPHOS 112  BILITOT 0.7   PROT 6.6  ALBUMIN 3.6   No results for input(s): LIPASE, AMYLASE in the last 168 hours. No results for input(s): AMMONIA in the last 168 hours.  ABG    Component Value Date/Time   PHART 7.202 (L) 08/21/2018 2126   PCO2ART 66.1 (HH) 08/21/2018 2126   PO2ART 69.0 (L) 08/21/2018 2126   HCO3 25.9 08/21/2018 2126   TCO2 28 08/21/2018 2126   ACIDBASEDEF 3.0 (H) 08/21/2018 2126   O2SAT 88.0 08/21/2018 2126     Coagulation Profile: Recent Labs  Lab 08/21/18 1925  INR 0.97    Cardiac Enzymes: No results for input(s): CKTOTAL, CKMB, CKMBINDEX, TROPONINI in the last 168 hours.  HbA1C: Hgb A1c MFr Bld  Date/Time Value Ref Range Status  04/22/2016 03:57 AM 8.1 (H) 4.8 - 5.6 % Final    Comment:    (NOTE)         Pre-diabetes: 5.7 - 6.4         Diabetes: >6.4         Glycemic control for adults with diabetes: <7.0     CBG: Recent Labs  Lab 08/21/18 1859 08/22/18 0037  GLUCAP 115* 214*    Review of Systems:   unable  Past Medical History  She,  has a past medical history of Diabetes mellitus without complication (HCC), Difficult intubation, Diverticulitis, Headache, Hepatitis C, History of kidney stones, Hypertension, IBS (irritable bowel syndrome), Kidney stones, Sciatica, Sleep apnea, and Stroke (HCC) (2017).   Surgical History    Past Surgical History:  Procedure Laterality Date  . ABDOMINAL HYSTERECTOMY    . carpel tunnel syndrome  2017  . HARDWARE REMOVAL Right 12/22/2017   Procedure: HARDWARE REMOVAL-RIGHT ARM;  Surgeon: Lyndle Herrlich, MD;  Location: [facility] ORS;  Service: Orthopedics;  Laterality: Right;  Removal of implant, superficial right humerus  . HUMERUS IM NAIL Right 03/11/2017   Procedure: INTRAMEDULLARY (IM) NAIL HUMERAL;  Surgeon: Lyndle Herrlich, MD;  Location: [facility] ORS;  Service: Orthopedics;  Laterality: Right;  . KIDNEY STONE SURGERY Right 02/12/12  . KIDNEY STONE SURGERY Left 07/15/2012  . LIGATION OF ARTERIOVENOUS  FISTULA Left 06/21/2016    Procedure: LIGATION OF ARTERIOVENOUS  FISTULA ( LIGATION BASILIC VEIN );  Surgeon: Renford Dills, MD;  Location: [facility] ORS;  Service: Vascular;  Laterality: Left;  . LITHOTRIPSY    . OOPHORECTOMY    . SINUS EXPLORATION    . TONSILLECTOMY       Social History   reports that she has been smoking cigarettes. She has a 1.50 pack-year smoking history. She has never used smokeless tobacco. She reports that she does not drink alcohol or use drugs.   Family History   Her family history includes Cancer in her sister; Diabetes in her father; Heart failure in her mother.   Allergies Allergies  Allergen Reactions  . Gabapentin Itching  . Tylenol [Acetaminophen] Itching  . Aspirin Itching  . Buprenorphine Hcl Itching  . Codeine Itching and Other (See Comments)    Makes her feel like something is crawling under her skin. Can take, sometimes makes her itch  . Compazine Itching and Other (See Comments)    "makes my skin crawl"  . Ioxaglate Itching  . Ivp Dye [Iodinated Diagnostic Agents] Itching and Other (See Comments)    Makes her itch really bad. Had to get 2-3 shots of Benadryl when she was in the hospital.  . Metrizamide Itching  . Naproxen Itching  . Norco [Hydrocodone-Acetaminophen] Itching  . Penicillin G Hives, Rash and Other (See Comments)    Has patient had a PCN reaction causing immediate rash, facial/tongue/throat swelling, SOB or lightheadedness with hypotension: Yes Has patient had a PCN reaction causing severe rash involving mucus membranes or skin necrosis: No Has patient had a PCN reaction that required hospitalization: No Has patient had a PCN reaction occurring within the last 10 years: No If all of the above answers are "NO", then may proceed with Cephalosporin use.   Marland Kitchen Penicillins Itching and Other (See Comments)    Has patient had a PCN reaction causing immediate rash, facial/tongue/throat swelling, SOB or lightheadedness with hypotension: Yes Has patient had a PCN  reaction causing severe rash involving mucus membranes or skin necrosis: No Has patient had  a PCN reaction that required hospitalization: No Has patient had a PCN reaction occurring within the last 10 years: No If all of the above answers are "NO", then may proceed with Cephalosporin use.   Marland Kitchen. Prochlorperazine Maleate Other (See Comments)    Compazine makes her skin crawl - needs 2-3 shots of Benadryl to get relief.  . Reglan [Metoclopramide] Itching and Other (See Comments)    Makes her skin crawl  . Sulfur Hives, Rash and Other (See Comments)    Skin Rashes, Hives  . Tegretol [Carbamazepine] Itching and Other (See Comments)    Makes her feel like something is crawling under her skin.  . Toradol [Ketorolac Tromethamine] Itching  . Tramadol Itching  . Zofran [Ondansetron Hcl] Itching     Home Medications  Prior to Admission medications   Medication Sig Start Date End Date Taking? Authorizing Provider  alprazolam Prudy Feeler(XANAX) 2 MG tablet Take 2 mg by mouth 2 (two) times daily as needed for anxiety.     [provider]  amLODipine (NORVASC) 5 MG tablet Take 5 mg by mouth daily.  06/25/18   [provider]  atorvastatin (LIPITOR) 40 MG tablet Take 40 mg by mouth every evening.    [provider]  ciprofloxacin (CIPRO) 500 MG tablet Take 500 mg by mouth 2 (two) times daily.    [provider]  cloNIDine (CATAPRES) 0.1 MG tablet Take 0.1 mg by mouth 2 (two) times daily.  06/20/18   [provider]  clopidogrel (PLAVIX) 75 MG tablet Take 1 tablet (75 mg total) by mouth daily. 06/27/16   Marvel PlanXu, Jindong, MD  diltiazem (CARDIZEM CD) 180 MG 24 hr capsule Take 180 mg by mouth daily.    [provider]  FLUoxetine (PROZAC) 40 MG capsule Take 40 mg by mouth daily.  06/28/18   [provider]  Galcanezumab-gnlm (EMGALITY) 120 MG/ML SOSY Inject 120 mg into the skin every 30 (thirty) days.    [provider]  glipiZIDE (GLUCOTROL) 5 MG  tablet Take 1 tablet (5 mg total) by mouth 2 (two) times daily before a meal. 04/30/16   Lora PaulaKrall, Jennifer T, MD  HYDROcodone-acetaminophen (NORCO/VICODIN) 5-325 MG tablet Take 1 tablet by mouth every 6 (six) hours as needed for moderate pain.    [provider]  loratadine (CLARITIN) 10 MG tablet Take 10 mg by mouth daily.    [provider]  losartan (COZAAR) 100 MG tablet Take 100 mg by mouth daily.  08/08/17   [provider]  metoprolol succinate (TOPROL-XL) 50 MG 24 hr tablet Take 50 mg by mouth daily. 06/03/18   [provider]  omeprazole (PRILOSEC) 40 MG capsule Take 40 mg by mouth daily.    [provider]  pregabalin (LYRICA) 50 MG capsule Take 50 mg by mouth at bedtime.     [provider]  promethazine (PHENERGAN) 25 MG tablet Take 25 mg by mouth every 8 (eight) hours as needed for nausea or vomiting.  08/08/17   [provider]  zolpidem (AMBIEN) 10 MG tablet Take 10 mg by mouth at bedtime.  08/14/17   [provider]     Critical care time: 60 mins    Posey BoyerBrooke Simpson, AGACNP-BC [facility] Pgr: [phone] or if no answer [phone] 08/22/2018, 1:03 AM

## 2018-08-21 NOTE — ED Provider Notes (Signed)
[facility]   CSN: [id number] Arrival date & time: 08/21/18  1755     History   Chief Complaint Chief Complaint  Patient presents with  . Abdominal Pain  . Back Pain  . Nausea  . Weakness    HPI Stephanie [name] is a 52 y.o. female.  HPI   52 year old female with change in mental status.  Unclear as to what exactly symptoms started.  She initially presented in urgent care and was referred to the emergency room for further evaluation.  Prior to the emergency room lethargic.  She is having difficulty answering basic questions.  Her husband is not currently here but apparently reported that she had fallen several times during this week.  Patient herself cannot give a coherent history.  Past Medical History:  Diagnosis Date  . Diabetes mellitus without complication (HCC)   . Difficult intubation   . Diverticulitis   . Headache   . Hepatitis C    treated and resolved  . History of kidney stones   . Hypertension   . IBS (irritable bowel syndrome)   . Kidney stones   . Sciatica   . Sleep apnea    on cpap  . Stroke [facility](HCC) 2017   memory loss    Patient Active Problem List   Diagnosis Date Noted  . Shoulder pain, right 12/22/2017  . UTI (urinary tract infection) 08/21/2017  . Nausea & vomiting 08/21/2017  . Nicotine dependence 08/21/2017  . Headache 01/07/2017  . Small bowel obstruction (HCC) 09/02/2016  . Pain and swelling of left upper extremity 07/22/2016  . Superficial venous thrombosis of arm, left 07/22/2016  . ICH (intracerebral hemorrhage) (HCC)   . Essential hypertension, malignant 04/19/2016  . Cytotoxic brain edema (HCC) 04/19/2016  . IVH (intraventricular hemorrhage) (HCC) 04/18/2016  . Hepatitis C     Past Surgical History:  Procedure Laterality Date  . ABDOMINAL HYSTERECTOMY    . carpel tunnel syndrome  2017  . HARDWARE REMOVAL Right 12/22/2017   Procedure: HARDWARE REMOVAL-RIGHT ARM;  Surgeon:  Lyndle HerrlichBowers, James R, MD;  Location: [facility] ORS;  Service: Orthopedics;  Laterality: Right;  Removal of implant, superficial right humerus  . HUMERUS IM NAIL Right 03/11/2017   Procedure: INTRAMEDULLARY (IM) NAIL HUMERAL;  Surgeon: Lyndle HerrlichBowers, James R, MD;  Location: [facility] ORS;  Service: Orthopedics;  Laterality: Right;  . KIDNEY STONE SURGERY Right 02/12/12  . KIDNEY STONE SURGERY Left 07/15/2012  . LIGATION OF ARTERIOVENOUS  FISTULA Left 06/21/2016   Procedure: LIGATION OF ARTERIOVENOUS  FISTULA ( LIGATION BASILIC VEIN );  Surgeon: Renford DillsGregory G Schnier, MD;  Location: [facility] ORS;  Service: Vascular;  Laterality: Left;  . LITHOTRIPSY    . OOPHORECTOMY    . SINUS EXPLORATION    . TONSILLECTOMY       OB History   No obstetric history on file.      Home Medications    Prior to Admission medications   Medication Sig Start Date End Date Taking? Authorizing Provider  alprazolam Prudy Feeler(XANAX) 2 MG tablet Take 2 mg by mouth 2 (two) times daily as needed for anxiety.    Yes [provider]  FLUoxetine (PROZAC) 40 MG capsule Take 40 mg by mouth daily.  06/28/18  Yes [provider]  amLODipine (NORVASC) 5 MG tablet Take 5 mg by mouth daily.  06/25/18   [provider]  atorvastatin (LIPITOR) 40 MG tablet Take 40 mg by mouth every evening.    [provider]  CARTIA XT 180 MG 24 hr capsule Take 180 mg by mouth daily.  04/13/18   [provider]  cloNIDine (CATAPRES) 0.1 MG tablet Take 0.1 mg by mouth 2 (two) times daily.  06/20/18   [provider]  clopidogrel (PLAVIX) 75 MG tablet Take 1 tablet (75 mg total) by mouth daily. 06/27/16   Marvel Plan, MD  EMGALITY 120 MG/ML SOAJ Inject 120 mg into the skin every 30 (thirty) days. 07/22/18   [provider]  glipiZIDE (GLUCOTROL) 5 MG tablet Take 1 tablet (5 mg total) by mouth 2 (two) times daily before a meal. 04/30/16   Lora Paula, MD  loratadine (CLARITIN) 10 MG tablet Take 10 mg by mouth daily.     [provider]  losartan (COZAAR) 100 MG tablet Take 100 mg by mouth daily.  08/08/17   [provider]  metoprolol succinate (TOPROL-XL) 50 MG 24 hr tablet Take 50 mg by mouth daily. 06/03/18   [provider]  omeprazole (PRILOSEC) 40 MG capsule Take 40 mg by mouth daily.    [provider]  promethazine (PHENERGAN) 25 MG tablet Take 25 mg by mouth every 6 (six) hours as needed for nausea or vomiting.  08/08/17   [provider]  zolpidem (AMBIEN) 10 MG tablet Take 10 mg by mouth at bedtime.  08/14/17   [provider]    Family History Family History  Problem Relation Age of Onset  . Heart failure Mother   . Diabetes Father   . Cancer Sister     Social History Social History   Tobacco Use  . Smoking status: Current Every Day Smoker    Packs/day: 1.00    Years: 1.50    Pack years: 1.50    Types: Cigarettes  . Smokeless tobacco: Never Used  Substance Use Topics  . Alcohol use: No    Comment: no alcohol since 2002  . Drug use: No    Comment: electric cig     Allergies   Gabapentin; Tylenol [acetaminophen]; Aspirin; Buprenorphine hcl; Codeine; Compazine; Ioxaglate; Ivp dye [iodinated diagnostic agents]; Metrizamide; Naproxen; Norco [hydrocodone-acetaminophen]; Penicillin g; Penicillins; Prochlorperazine maleate; Reglan [metoclopramide]; Sulfur; Tegretol [carbamazepine]; Toradol [ketorolac tromethamine]; Tramadol; and Zofran [ondansetron hcl]   Review of Systems Review of Systems  Level 5 caveat because of confusion. Physical Exam Updated Vital Signs BP 140/74   Pulse 62   Temp 98 F (36.7 C) (Oral)   Resp 16   SpO2 (!) 86%   Physical Exam Vitals signs and nursing note reviewed.  Constitutional:      Appearance: She is obese.     Comments: Very drowsy.  Obese.  Disheveled appearance.  HENT:     Head: Normocephalic and atraumatic.  Eyes:     General:        Right eye: No discharge.        Left eye: No  discharge.     Conjunctiva/sclera: Conjunctivae normal.     Pupils: Pupils are equal, round, and reactive to light.     Comments: Pupils 4mm, symmetric reactive.   Neck:     Musculoskeletal: Neck supple.  Cardiovascular:     Rate and Rhythm: Normal rate and regular rhythm.     Heart sounds: Normal heart sounds. No murmur. No friction rub. No gallop.   Pulmonary:     Effort: Pulmonary effort is normal. No respiratory distress.     Breath sounds: Normal breath sounds.  Abdominal:  General: There is no distension.     Palpations: Abdomen is soft.     Tenderness: There is no abdominal tenderness.  Musculoskeletal:     Comments: Ecchymosis to the right mid to lower thoracic region off the midline.  No apparent midline spinal tenderness but hard to fully assess.  Skin:    General: Skin is warm and dry.  Neurological:     Comments: Very drowsy.  Answers mumbled.  Does follow some simple commands.  She will squeeze my fingers bilaterally she will squeeze my fingers with either hand on command.  She will move her feet on command.      ED Treatments / Results  Labs (all labs ordered are listed, but only abnormal results are displayed) Labs Reviewed  COMPREHENSIVE METABOLIC PANEL - Abnormal; Notable for the following components:      Result Value   Potassium 6.0 (*)    Glucose, Bld 119 (*)    Creatinine, Ser 1.09 (*)    Calcium 7.1 (*)    GFR calc non Af Amer 58 (*)    All other components within normal limits  CBC WITH DIFFERENTIAL/PLATELET - Abnormal; Notable for the following components:   Platelets 135 (*)    All other components within normal limits  URINALYSIS, ROUTINE W REFLEX MICROSCOPIC - Abnormal; Notable for the following components:   APPearance TURBID (*)    Ketones, ur 5 (*)    Protein, ur 100 (*)    Leukocytes, UA MODERATE (*)    WBC, UA >50 (*)    Bacteria, UA MANY (*)    All other components within normal limits  GLUCOSE, CAPILLARY - Abnormal; Notable for the  following components:   Glucose-Capillary 214 (*)    All other components within normal limits  I-STAT CG4 LACTIC ACID, ED - Abnormal; Notable for the following components:   Lactic Acid, Venous 2.36 (*)    All other components within normal limits  I-STAT BETA HCG BLOOD, ED ([facility], [facility], [facility] ONLY) - Abnormal; Notable for the following components:   I-stat hCG, quantitative 7.2 (*)    All other components within normal limits  CBG MONITORING, ED - Abnormal; Notable for the following components:   Glucose-Capillary 115 (*)    All other components within normal limits  I-STAT ARTERIAL BLOOD GAS, ED - Abnormal; Notable for the following components:   pH, Arterial 7.202 (*)    pCO2 arterial 66.1 (*)    pO2, Arterial 69.0 (*)    Acid-base deficit 3.0 (*)    All other components within normal limits  CULTURE, BLOOD (ROUTINE X 2)  CULTURE, BLOOD (ROUTINE X 2)  URINE CULTURE  MRSA PCR SCREENING  PROTIME-INR  POTASSIUM  BRAIN NATRIURETIC PEPTIDE  PROCALCITONIN  PROCALCITONIN  BLOOD GAS, ARTERIAL  RAPID URINE DRUG SCREEN, HOSP PERFORMED  ETHANOL  TSH  ACETAMINOPHEN LEVEL  SALICYLATE LEVEL  RENAL FUNCTION PANEL  MAGNESIUM  CBC  TROPONIN I  CK  TROPONIN I  I-STAT CG4 LACTIC ACID, ED    EKG EKG Interpretation  Date/Time:  Friday August 21 2018 18:12:19 EST Ventricular Rate:  56 PR Interval:  200 QRS Duration: 82 QT Interval:  416 QTC Calculation: 401 R Axis:     Text Interpretation:  Sinus bradycardia Cannot rule out Anterior infarct , age undetermined Abnormal ECG Confirmed by Raeford Razor [id number]) on 08/21/2018 10:28:17 PM   Radiology Dg Chest 2 View  Result Date: 08/21/2018 CLINICAL DATA:  Altered mental status. Weakness and low blood pressure.  EXAM: CHEST - 2 VIEW COMPARISON:  Radiographs 08/16/2018 and 06/25/2018. Abdominal CT 08/16/2018. FINDINGS: The heart size is stable and normal for AP technique. There are lower lung volumes with vascular congestion and probable  mild bibasilar atelectasis. There is no confluent airspace opacity, pneumothorax or significant pleural effusion. No acute osseous findings are evident. IMPRESSION: Lower lung volumes with vascular congestion and probable mild bibasilar atelectasis. Electronically Signed   By: Carey Bullocks M.D.   On: 08/21/2018 19:15   Dg Chest Port 1 View  Result Date: 08/21/2018 CLINICAL DATA:  Intubation. EXAM: PORTABLE CHEST 1 VIEW COMPARISON:  Radiograph earlier this day FINDINGS: Tip of the endotracheal tube in the right mainstem bronchus. Enteric tube in place with tip below the diaphragm, side-port not well visualized. Unchanged heart size and mediastinal contours. Increasing retrocardiac opacity with possible left pleural effusion. Vascular congestion again seen. No pneumothorax. No acute osseous abnormalities. IMPRESSION: 1. Right mainstem intubation. Recommend retraction of 3.5 cm for optimal placement. Tip of the enteric tube below the diaphragm. 2. Increasing retrocardiac opacity with possible left pleural effusion, aspiration is considered. Vascular congestion again seen. These results were called by telephone at the time of interpretation on 08/21/2018 at 10:30 pm to Dr. Raeford Razor , who verbally acknowledged these results. Electronically Signed   By: Narda Rutherford M.D.   On: 08/21/2018 22:30    Procedures Procedure Name: Intubation Date/Time: 08/21/2018 10:08 PM Performed by: Raeford Razor, MD Pre-anesthesia Checklist: Patient identified, Patient being monitored, Emergency Drugs available, Timeout performed and Suction available Oxygen Delivery Method: Non-rebreather mask Preoxygenation: Pre-oxygenation with 100% oxygen Induction Type: Rapid sequence Ventilation: Mask ventilation without difficulty Laryngoscope Size: Glidescope and 4 Grade View: Grade III Tube size: 7.5 mm Number of attempts: 3 Airway Equipment and Method: Bougie stylet and Video-laryngoscopy Placement Confirmation:  CO2 detector and Breath sounds checked- equal and bilateral Secured at: 27 cm Dental Injury: Bloody posterior oropharynx  Difficulty Due To: Difficult Airway- due to dentition, Difficult Airway- due to immobile epiglottis and Difficult Airway- due to anterior larynx Future Recommendations: Recommend- induction with short-acting agent, and alternative techniques readily available Comments: Difficult airway. Three attempts. Bagged easily in between. I never visualized cords. Had to use bougie.        CRITICAL CARE Performed by: Raeford Razor Total critical care time: 35 minutes Critical care time was exclusive of separately billable procedures and treating other patients. Critical care was necessary to treat or prevent imminent or life-threatening deterioration. Critical care was time spent personally by me on the following activities: development of treatment plan with patient and/or surrogate as well as nursing, discussions with consultants, evaluation of patient's response to treatment, examination of patient, obtaining history from patient or surrogate, ordering and performing treatments and interventions, ordering and review of laboratory studies, ordering and review of radiographic studies, pulse oximetry and re-evaluation of patient's condition.  (including critical care time)  Medications Ordered in ED Medications - No data to display   Initial Impression / Assessment and Plan / ED Course  I have reviewed the triage vital signs and the nursing notes.  Pertinent labs & imaging results that were available during my care of the patient were reviewed by me and considered in my medical decision making (see chart for details).  52 year old female with decreased mental status.  She progressively became more lethargic while in the emergency room.  She progressed to the point where she would open her eyes briefly with a sternal rub.  Intubated for airway protection.  Empiric antibiotics.   Hypotension.  Not initial respond to IV fluids.  Levophed started.  She does appear to have a UTI.  She needs CT of her head.  Critical care consulted for admission.    Final Clinical Impressions(s) / ED Diagnoses   Final diagnoses:  Respiratory failure (HCC)  Encephalopathy  Urinary tract infection with hematuria, site unspecified    ED Discharge Orders    None       Raeford Razor, MD 08/22/18 [phone]

## 2018-08-21 NOTE — ED Notes (Signed)
Resp at bedside

## 2018-08-21 NOTE — ED Triage Notes (Addendum)
[facility] Dayton ScrapeMurray. [facility]   PT's husband elects to take her to ED in private vehicle. PT helped into car via wheelchair and nursing assistance

## 2018-08-21 NOTE — ED Notes (Signed)
Airway adjusted by Juleen ChinaKohut, MD now 22 at the lip

## 2018-08-21 NOTE — ED Notes (Signed)
PT reports several falls recently. Slurred speech. PT on plavix. History of stroke. PT reports speech changes are new since falls 3 days ago. Per PT, it appears she falls daily. PT appears disheveled.   Large bruise on mid back.

## 2018-08-21 NOTE — ED Notes (Signed)
Respiratory notified of need for ABG.

## 2018-08-21 NOTE — ED Notes (Signed)
ED Provider at bedside. 

## 2018-08-21 NOTE — ED Provider Notes (Signed)
Brief assessment:  Patient in wheelchair and unable to keep eyes open, cannot give coherent history.  Speech is slurred.  Says she fell ?today ?couple days ago, not able to clarify, refers to meeting Sponge Nadine CountsBob, says her speech is not normal for her. Slight facial droop, of unclear age. Big bruise on back.  Mild hypoxia (sat 94%). Referring to ED for multiple comorbidities and ability to perform more comprehensive evaluation.   [name], Stephanie Niswander Wilson, MD 08/21/18 1754

## 2018-08-21 NOTE — ED Notes (Signed)
Urine culture save tube sent with urine specimen.  

## 2018-08-21 NOTE — ED Triage Notes (Addendum)
Pt. Was sent to the ED from Urgent care for further evaluations.  Pt. Reports having n/v/ abdominal pain, and back pain.  She is also having slurred speech that she reports that she woke up with.  She is pale and very sleepy.  She also reports having passed blood last week.    Pt. Is very lethargic and difficulty answering questions.  Husband reports that she is having falls throughout the week and she has a bruise on her back.

## 2018-08-21 NOTE — ED Notes (Addendum)
ED Provider at bedside. Pt brady to 37, pt placed on zoll.

## 2018-08-22 ENCOUNTER — Inpatient Hospital Stay (HOSPITAL_COMMUNITY): Payer: No Typology Code available for payment source

## 2018-08-22 DIAGNOSIS — G9340 Encephalopathy, unspecified: Secondary | ICD-10-CM

## 2018-08-22 DIAGNOSIS — I498 Other specified cardiac arrhythmias: Secondary | ICD-10-CM | POA: Insufficient documentation

## 2018-08-22 DIAGNOSIS — N3000 Acute cystitis without hematuria: Secondary | ICD-10-CM

## 2018-08-22 DIAGNOSIS — E119 Type 2 diabetes mellitus without complications: Secondary | ICD-10-CM

## 2018-08-22 DIAGNOSIS — R001 Bradycardia, unspecified: Secondary | ICD-10-CM | POA: Diagnosis present

## 2018-08-22 DIAGNOSIS — I959 Hypotension, unspecified: Secondary | ICD-10-CM | POA: Diagnosis present

## 2018-08-22 HISTORY — DX: Hypotension, unspecified: I95.9

## 2018-08-22 LAB — RAPID URINE DRUG SCREEN, HOSP PERFORMED
AMPHETAMINES: NOT DETECTED
Barbiturates: NOT DETECTED
Barbiturates: NOT DETECTED
Benzodiazepines: POSITIVE — AB
Cocaine: NOT DETECTED
Opiates: POSITIVE — AB
Tetrahydrocannabinol: NOT DETECTED

## 2018-08-22 LAB — RENAL FUNCTION PANEL
Albumin: 2.5 g/dL — ABNORMAL LOW (ref 3.5–5.0)
Anion gap: 12 (ref 5–15)
BUN: 11 mg/dL (ref 6–20)
CALCIUM: 7.7 mg/dL — AB (ref 8.9–10.3)
CO2: 20 mmol/L — ABNORMAL LOW (ref 22–32)
Chloride: 111 mmol/L (ref 98–111)
Creatinine, Ser: 0.78 mg/dL (ref 0.44–1.00)
GFR calc Af Amer: >60 mL/min (ref >60)
GFR calc non Af Amer: >60 mL/min (ref >60)
Glucose, Bld: 156 mg/dL — ABNORMAL HIGH (ref 70–99)
Phosphorus: 2.3 mg/dL — ABNORMAL LOW (ref 2.5–4.6)
Potassium: 4 mmol/L (ref 3.5–5.1)
Sodium: 143 mmol/L (ref 135–145)

## 2018-08-22 LAB — TROPONIN I
Troponin I: <0.03 ng/mL (ref <0.03)
Troponin I: <0.03 ng/mL (ref <0.03)

## 2018-08-22 LAB — SALICYLATE LEVEL: Salicylate Lvl: <7 mg/dL (ref 2.8–30.0)

## 2018-08-22 LAB — POCT I-STAT 3, ART BLOOD GAS (G3+)
ACID-BASE DEFICIT: 5 mmol/L — AB (ref 0.0–2.0)
Bicarbonate: 20.9 mmol/L (ref 20.0–28.0)
O2 Saturation: 93 %
Patient temperature: 98
TCO2: 22 mmol/L (ref 22–32)
pCO2 arterial: 40.6 mmHg (ref 32.0–48.0)
pH, Arterial: 7.318 — ABNORMAL LOW (ref 7.350–7.450)
pO2, Arterial: 71 mmHg — ABNORMAL LOW (ref 83.0–108.0)

## 2018-08-22 LAB — PROCALCITONIN
Procalcitonin: <0.1 ng/mL
Procalcitonin: <0.1 ng/mL

## 2018-08-22 LAB — CBC
HCT: 36 % (ref 36.0–46.0)
Hemoglobin: 11 g/dL — ABNORMAL LOW (ref 12.0–15.0)
MCH: 30.1 pg (ref 26.0–34.0)
MCHC: 30.6 g/dL (ref 30.0–36.0)
MCV: 98.4 fL (ref 80.0–100.0)
Platelets: 141 10*3/uL — ABNORMAL LOW (ref 150–400)
RBC: 3.66 MIL/uL — ABNORMAL LOW (ref 3.87–5.11)
RDW: 14.4 % (ref 11.5–15.5)
WBC: 10.3 10*3/uL (ref 4.0–10.5)
nRBC: 0 % (ref 0.0–0.2)

## 2018-08-22 LAB — ETHANOL: Alcohol, Ethyl (B): <10 mg/dL (ref <10)

## 2018-08-22 LAB — MAGNESIUM: MAGNESIUM: 1.5 mg/dL — AB (ref 1.7–2.4)

## 2018-08-22 LAB — MRSA PCR SCREENING: MRSA by PCR: NEGATIVE

## 2018-08-22 LAB — GLUCOSE, CAPILLARY
Glucose-Capillary: 116 mg/dL — ABNORMAL HIGH (ref 70–99)
Glucose-Capillary: 119 mg/dL — ABNORMAL HIGH (ref 70–99)
Glucose-Capillary: 139 mg/dL — ABNORMAL HIGH (ref 70–99)
Glucose-Capillary: 145 mg/dL — ABNORMAL HIGH (ref 70–99)
Glucose-Capillary: 192 mg/dL — ABNORMAL HIGH (ref 70–99)
Glucose-Capillary: 214 mg/dL — ABNORMAL HIGH (ref 70–99)

## 2018-08-22 LAB — TSH: TSH: 1.241 u[IU]/mL (ref 0.350–4.500)

## 2018-08-22 LAB — CK: Total CK: 137 U/L (ref 38–234)

## 2018-08-22 LAB — ACETAMINOPHEN LEVEL: Acetaminophen (Tylenol), Serum: <10 ug/mL — ABNORMAL LOW (ref 10–30)

## 2018-08-22 MED ORDER — DOPAMINE-DEXTROSE 3.2-5 MG/ML-% IV SOLN
0.0000 ug/kg/min | INTRAVENOUS | Status: DC
  Administered 2018-08-22: 5 ug/kg/min via INTRAVENOUS
  Filled 2018-08-22: qty 250

## 2018-08-22 MED ORDER — IPRATROPIUM-ALBUTEROL 0.5-2.5 (3) MG/3ML IN SOLN
3.0000 mL | Freq: Three times a day (TID) | RESPIRATORY_TRACT | Status: DC
  Administered 2018-08-22 – 2018-08-23 (×3): 3 mL via RESPIRATORY_TRACT
  Filled 2018-08-22 (×3): qty 3

## 2018-08-22 MED ORDER — SODIUM CHLORIDE 0.9 % IV SOLN
INTRAVENOUS | Status: DC | PRN
  Administered 2018-08-22: 1000 mL via INTRAVENOUS

## 2018-08-22 MED ORDER — IPRATROPIUM-ALBUTEROL 0.5-2.5 (3) MG/3ML IN SOLN: 3.0000 mL | RESPIRATORY_TRACT | Status: DC | PRN

## 2018-08-22 MED ORDER — DOPAMINE-DEXTROSE 3.2-5 MG/ML-% IV SOLN: 0.0000 ug/kg/min | INTRAVENOUS | Status: DC

## 2018-08-22 MED ORDER — CIPROFLOXACIN IN D5W 400 MG/200ML IV SOLN
400.0000 mg | Freq: Two times a day (BID) | INTRAVENOUS | Status: DC
  Administered 2018-08-22 – 2018-08-23 (×3): 400 mg via INTRAVENOUS
  Filled 2018-08-22 (×3): qty 200

## 2018-08-22 NOTE — Procedures (Signed)
Extubation Procedure Note  Patient Details:   Name: Stephanie GuerinDianna B [name] DOB: 01-20-1966 MRN: [id number]   Airway Documentation:   Patient extubated per orders. Patient had positive cuff leak prior to extubation. Placed on 4lpm humidified O2. RN at bedside. Patient able to state her name and has a strong cough. Vent end date: 08/22/18 Vent end time: 1306   Evaluation  O2 sats: stable throughout Complications: No apparent complications Patient did tolerate procedure well. Bilateral Breath Sounds: Clear, Diminished   Yes  [name] 08/22/2018, 1:07 PM

## 2018-08-22 NOTE — Progress Notes (Signed)
RT collected ABG sample and ran on Istat. Results came back as insufficient blood sample. RT was unable to obtain another sample d/t pts low BP and Low HR at this time. CCM NP Brook contacted by [employer] about possible femoral line placement. RT will continue to monitor.

## 2018-08-22 NOTE — Progress Notes (Signed)
NAME:  Stephanie [name], MRN:  [id number], DOB:  24-Dec-1965, LOS: 1 ADMISSION DATE:  08/21/2018, CONSULTATION DATE:  08/21/2018 REFERRING MD:  Dr. Juleen China, CHIEF COMPLAINT:  AMS  Brief History   52 yoF presenting with slurred speech, multiple falls at home, and somnolence requiring intubation for airway protection and respiratory acidosis.  Additionally hypotensive despite 3L NS requiring low dose NE and bradycardic into the 40's. +UTI, CTH pending.   History of present illness   HPI obtained from medical chart review as patient is encephalopathic on mechanical ventilation. No family present at bedside.   52 year old female with history significant for tobacco abuse, DM, HTN, and prior stroke who presents from home with altered mental status.    Patient represented to urgent care with complaints of back pain, nausea, back pain and bruising, slurred speech and several falls at home.  Patient on plavix only.  Patient nor husband able to give clear history.  Unknown baseline or if residual affects from prior stroke.  She was noted to be lethargic and sent to [facility] ER for further eval.  In the ER, she was lethargic and sonorous.  ABG showed respiratory acidosis.  CXR noted for mild vascular congestion.  She was intubated and noted to be difficult airway.  Additionally, she was afebrile and hypotensive, requiring 3L NS and low dose peripheral levophed, and bradycardic in the 40's. Atropine given without change in HR.  EKG showing first degree AV block, abnormal labs noted for plts 135, LA 2.36 ->1.45, +UTI.  CT head / cervical pending.  PCCM called for admit.   Of note, on pharm review, pharmacy notes that patient had her prozac filled at 3 different pharmacies within days of each other this month.  Past Medical History  Tobacco abuse, DM, HTN, IBS, stroke, headache, treated Hep C, kidney stones, diverticulitis   Significant Hospital Events   12/13 Admitted  Consults:   Procedures:  12/13 ETT  (difficult intubation) >>  Significant Diagnostic Tests:  12/13 CTH/ cervical spine >> no acute findings on head CT, cervical spine without fracture of muscle 12/13 CT abd/pelvis >> I meant question left lower lobe atelectasis versus pneumonia, no hydronephrosis but some scattered calcifications  Micro Data:  12/13 MRSA PCR >> 12/13 BCx2 >> 12/13 UC >>  Antimicrobials:  12/13 aztreonam >> 12/14 12/13 vanc >> 12/14 Cipro 12/14 >>   Interim history/subjective:  Improve neurological status. Dopamine has been weaned off (was hypotensive and bradycardic) Tylenol and salicylate levels negative   Objective   Blood pressure 103/72, pulse 86, temperature 99.4 F (37.4 C), temperature source Oral, resp. rate (!) 29, weight 102.9 kg, SpO2 90 %.    Vent Mode: PSV;CPAP FiO2 (%):  [40 %] 40 % Set Rate:  [18 bmp] 18 bmp Vt Set:  [550 mL] 550 mL PEEP:  [5 cmH20] 5 cmH20 Pressure Support:  [10 cmH20] 10 cmH20 Plateau Pressure:  [18 cmH20-36 cmH20] 20 cmH20   Intake/Output Summary (Last 24 hours) at 08/22/2018 1659 Last data filed at 08/22/2018 1400 Gross per 24 hour  Intake 2467.45 ml  Output 1240 ml  Net 1227.45 ml   Filed Weights   08/21/18 2100 08/22/18 0344  Weight: 88 kg 102.9 kg   Examination: General:  Obese WF on MV HEENT: MM pink/moist, ETT at 22, OGT, pupils 3/reactive, anicteric  Neuro: now awake and following commands, nodding to questions.  CV: rr, 1deg AV block, no murmur, +1 distal pulses PULM: clear, decreased at bases GI:  soft, +BS Extremities: cool /dry, no LE edema  Skin: no rashes, see picture of bruising to back   Resolved Hospital Problem list    Assessment & Plan:  Acute encephalopathy - reported falls and slurred speech -Question related to medications, sepsis/UTI - ddx include intracranial process vs toxic/metabolic process vs ? Overdose/ prozac/  since patient refilled rx at 3 different pharmacies  P:  Suspect medications and also UTI.  Other  evaluation reassuring thus far Improved and will lighten sedation, work toward spontaneous breathing trial  Acute respiratory failure Tobacco abuse  P:  Spontaneous breathing trial this morning, goal extubation today duonebs q 4 hr  Hypotension - possibly related to sepsis although afebrile and normal WBC, could be dehydration but unclear hx of N/V - large bruising to back however Hgb is stable - s/p 3L fluid and weaning levophed - lactate normalized P:  Dopamine weaned to off Continue maintenance IV fluids for now Antibiotics as below Consider TTE if recurrent hypotension  Bradycardia - first degree AV block on EKG, no prior hx of bradycardia.  Patient is on CCB and BB at home - Possible med related/ ?OD, but pressors requirements are improving, almost off P:  Dopamine weaned to off Follow EKG, QTC  UTI P:  Culture pending Change antibiotic to ciprofloxacin and follow CT abdomen without any hydronephrosis or obstructing stone.  Hx DM P:  Sliding scale insulin per protocol  Hx HTN/ HLD P:  Antihypertensive regimen on hold  Best practice:  Diet: NPO Pain/Anxiety/Delirium protocol (if indicated): prn fentanyl/ versed VAP protocol (if indicated): yes DVT prophylaxis: SCDs only for now GI prophylaxis: pepcid  Glucose control: SSI Mobility: BR Code Status: Full  Family Communication: No family at bedside.  RN reports patient's husband left earlier. Disposition: ICU  Labs   CBC: Recent Labs  Lab 08/21/18 1845 08/22/18 0720  WBC 8.6 10.3  NEUTROABS 4.5  --   HGB 13.0 11.0*  HCT 42.5 36.0  MCV 97.9 98.4  PLT 135* 141*    Basic Metabolic Panel: Recent Labs  Lab 08/21/18 1845 08/21/18 2138 08/22/18 0720  NA 142  --  143  K 6.0* 4.8 4.0  CL 109  --  111  CO2 23  --  20*  GLUCOSE 119*  --  156*  BUN 16  --  11  CREATININE 1.09*  --  0.78  CALCIUM 7.1*  --  7.7*  MG  --   --  1.5*  PHOS  --   --  2.3*   GFR: Estimated Creatinine Clearance: 91.4  mL/min (by C-G formula based on SCr of 0.78 mg/dL). Recent Labs  Lab 08/21/18 1845 08/21/18 1857 08/21/18 2139 08/21/18 2331 08/22/18 0720  PROCALCITON  --   --   --  <0.10 <0.10  WBC 8.6  --   --   --  10.3  LATICACIDVEN  --  2.36* 1.45  --   --     Liver Function Tests: Recent Labs  Lab 08/21/18 1845 08/22/18 0720  AST 27  --   ALT 20  --   ALKPHOS 112  --   BILITOT 0.7  --   PROT 6.6  --   ALBUMIN 3.6 2.5*   No results for input(s): LIPASE, AMYLASE in the last 168 hours. No results for input(s): AMMONIA in the last 168 hours.  ABG    Component Value Date/Time   PHART 7.318 (L) 08/22/2018 0723   PCO2ART 40.6 08/22/2018 0723   PO2ART 71.0 (  L) 08/22/2018 0723   HCO3 20.9 08/22/2018 0723   TCO2 22 08/22/2018 0723   ACIDBASEDEF 5.0 (H) 08/22/2018 0723   O2SAT 93.0 08/22/2018 0723     Coagulation Profile: Recent Labs  Lab 08/21/18 1925  INR 0.97    Cardiac Enzymes: Recent Labs  Lab 08/21/18 2331 08/22/18 0720  CKTOTAL  --  137  TROPONINI <0.03 <0.03    HbA1C: Hgb A1c MFr Bld  Date/Time Value Ref Range Status  04/22/2016 03:57 AM 8.1 (H) 4.8 - 5.6 % Final    Comment:    (NOTE)         Pre-diabetes: 5.7 - 6.4         Diabetes: >6.4         Glycemic control for adults with diabetes: <7.0     CBG: Recent Labs  Lab 08/22/18 0037 08/22/18 0406 08/22/18 0834 08/22/18 1151 08/22/18 1555  GLUCAP 214* 192* 139* 145* 119*    Review of Systems:   unable  Past Medical History  She,  has a past medical history of Diabetes mellitus without complication (HCC), Difficult intubation, Diverticulitis, Headache, Hepatitis C, History of kidney stones, Hypertension, IBS (irritable bowel syndrome), Kidney stones, Sciatica, Sleep apnea, and Stroke (HCC) (2017).   Surgical History    Past Surgical History:  Procedure Laterality Date  . ABDOMINAL HYSTERECTOMY    . carpel tunnel syndrome  2017  . HARDWARE REMOVAL Right 12/22/2017   Procedure: HARDWARE  REMOVAL-RIGHT ARM;  Surgeon: Lyndle Herrlich, MD;  Location: [facility] ORS;  Service: Orthopedics;  Laterality: Right;  Removal of implant, superficial right humerus  . HUMERUS IM NAIL Right 03/11/2017   Procedure: INTRAMEDULLARY (IM) NAIL HUMERAL;  Surgeon: Lyndle Herrlich, MD;  Location: [facility] ORS;  Service: Orthopedics;  Laterality: Right;  . KIDNEY STONE SURGERY Right 02/12/12  . KIDNEY STONE SURGERY Left 07/15/2012  . LIGATION OF ARTERIOVENOUS  FISTULA Left 06/21/2016   Procedure: LIGATION OF ARTERIOVENOUS  FISTULA ( LIGATION BASILIC VEIN );  Surgeon: Renford Dills, MD;  Location: [facility] ORS;  Service: Vascular;  Laterality: Left;  . LITHOTRIPSY    . OOPHORECTOMY    . SINUS EXPLORATION    . TONSILLECTOMY       Social History   reports that she has been smoking cigarettes. She has a 1.50 pack-year smoking history. She has never used smokeless tobacco. She reports that she does not drink alcohol or use drugs.   Family History   Her family history includes Cancer in her sister; Diabetes in her father; Heart failure in her mother.   Allergies Allergies  Allergen Reactions  . Gabapentin Itching  . Tylenol [Acetaminophen] Itching  . Aspirin Itching  . Buprenorphine Hcl Itching  . Codeine Itching and Other (See Comments)    Makes her feel like something is crawling under her skin. Can take, sometimes makes her itch  . Compazine Itching and Other (See Comments)    "makes my skin crawl"  . Ioxaglate Itching  . Ivp Dye [Iodinated Diagnostic Agents] Itching and Other (See Comments)    Makes her itch really bad. Had to get 2-3 shots of Benadryl when she was in the hospital.  . Metrizamide Itching  . Naproxen Itching  . Norco [Hydrocodone-Acetaminophen] Itching  . Penicillin G Hives, Rash and Other (See Comments)    Has patient had a PCN reaction causing immediate rash, facial/tongue/throat swelling, SOB or lightheadedness with hypotension: Yes Has patient had a PCN reaction causing severe  rash involving mucus  membranes or skin necrosis: No Has patient had a PCN reaction that required hospitalization: No Has patient had a PCN reaction occurring within the last 10 years: No If all of the above answers are "NO", then may proceed with Cephalosporin use.   Marland Kitchen. Penicillins Itching and Other (See Comments)    Has patient had a PCN reaction causing immediate rash, facial/tongue/throat swelling, SOB or lightheadedness with hypotension: Yes Has patient had a PCN reaction causing severe rash involving mucus membranes or skin necrosis: No Has patient had a PCN reaction that required hospitalization: No Has patient had a PCN reaction occurring within the last 10 years: No If all of the above answers are "NO", then may proceed with Cephalosporin use.   Marland Kitchen. Prochlorperazine Maleate Other (See Comments)    Compazine makes her skin crawl - needs 2-3 shots of Benadryl to get relief.  . Reglan [Metoclopramide] Itching and Other (See Comments)    Makes her skin crawl  . Sulfur Hives, Rash and Other (See Comments)    Skin Rashes, Hives  . Tegretol [Carbamazepine] Itching and Other (See Comments)    Makes her feel like something is crawling under her skin.  . Toradol [Ketorolac Tromethamine] Itching  . Tramadol Itching  . Zofran [Ondansetron Hcl] Itching     Home Medications  Prior to Admission medications   Medication Sig Start Date End Date Taking? Authorizing Provider  alprazolam Prudy Feeler(XANAX) 2 MG tablet Take 2 mg by mouth 2 (two) times daily as needed for anxiety.     [provider]  amLODipine (NORVASC) 5 MG tablet Take 5 mg by mouth daily.  06/25/18   [provider]  atorvastatin (LIPITOR) 40 MG tablet Take 40 mg by mouth every evening.    [provider]  ciprofloxacin (CIPRO) 500 MG tablet Take 500 mg by mouth 2 (two) times daily.    [provider]  cloNIDine (CATAPRES) 0.1 MG tablet Take 0.1 mg by mouth 2 (two) times daily.  06/20/18   [provider]  clopidogrel (PLAVIX) 75 MG tablet Take 1 tablet (75 mg total) by mouth daily. 06/27/16   Marvel PlanXu, Jindong, MD  diltiazem (CARDIZEM CD) 180 MG 24 hr capsule Take 180 mg by mouth daily.    [provider]  FLUoxetine (PROZAC) 40 MG capsule Take 40 mg by mouth daily.  06/28/18   [provider]  Galcanezumab-gnlm (EMGALITY) 120 MG/ML SOSY Inject 120 mg into the skin every 30 (thirty) days.    [provider]  glipiZIDE (GLUCOTROL) 5 MG tablet Take 1 tablet (5 mg total) by mouth 2 (two) times daily before a meal. 04/30/16   Lora PaulaKrall, Jennifer T, MD  HYDROcodone-acetaminophen (NORCO/VICODIN) 5-325 MG tablet Take 1 tablet by mouth every 6 (six) hours as needed for moderate pain.    [provider]  loratadine (CLARITIN) 10 MG tablet Take 10 mg by mouth daily.    [provider]  losartan (COZAAR) 100 MG tablet Take 100 mg by mouth daily.  08/08/17   [provider]  metoprolol succinate (TOPROL-XL) 50 MG 24 hr tablet Take 50 mg by mouth daily. 06/03/18   [provider]  omeprazole (PRILOSEC) 40 MG capsule Take 40 mg by mouth daily.    [provider]  pregabalin (LYRICA) 50 MG capsule Take 50 mg by mouth at bedtime.     [provider]  promethazine (PHENERGAN) 25 MG tablet Take 25 mg by mouth every 8 (eight) hours as needed for nausea  or vomiting.  08/08/17   [provider]  zolpidem (AMBIEN) 10 MG tablet Take 10 mg by mouth at bedtime.  08/14/17   [provider]     Critical care time: 35 mins     Levy Pupa, MD, PhD 08/22/2018, 5:06 PM [facility] [phone] or if no answer [phone]

## 2018-08-22 NOTE — Progress Notes (Signed)
Call received from family friend, Stephanie [name]. Per Stephanie [name], patient's husband is currently at work and plans to come to hospital this morning at approximately 11 AM. Dawn expressed that patient's primary care [facility] is at [facility] in [place] and that she was recently started on a new medication for blood pressure. Plans are for her to call Stephanie [name] and ask him to bring Stephanie [name]' medications to the hospital this morning.

## 2018-08-23 DIAGNOSIS — I9589 Other hypotension: Secondary | ICD-10-CM

## 2018-08-23 LAB — PROCALCITONIN: Procalcitonin: <0.1 ng/mL

## 2018-08-23 LAB — URINE CULTURE: Culture: 20000 — AB

## 2018-08-23 LAB — GLUCOSE, CAPILLARY
GLUCOSE-CAPILLARY: 122 mg/dL — AB (ref 70–99)
Glucose-Capillary: 117 mg/dL — ABNORMAL HIGH (ref 70–99)
Glucose-Capillary: 123 mg/dL — ABNORMAL HIGH (ref 70–99)
Glucose-Capillary: 173 mg/dL — ABNORMAL HIGH (ref 70–99)

## 2018-08-23 MED ORDER — CLONIDINE HCL 0.1 MG PO TABS
0.1000 mg | ORAL_TABLET | Freq: Two times a day (BID) | ORAL | Status: DC
  Administered 2018-08-23 – 2018-08-25 (×4): 0.1 mg via ORAL
  Filled 2018-08-23 (×4): qty 1

## 2018-08-23 MED ORDER — CLOPIDOGREL BISULFATE 75 MG PO TABS
75.0000 mg | ORAL_TABLET | Freq: Every day | ORAL | Status: DC
  Administered 2018-08-23 – 2018-08-25 (×3): 75 mg via ORAL
  Filled 2018-08-23 (×3): qty 1

## 2018-08-23 MED ORDER — FLUOXETINE HCL 20 MG PO CAPS
40.0000 mg | ORAL_CAPSULE | Freq: Every day | ORAL | Status: DC
  Administered 2018-08-23 – 2018-08-25 (×3): 40 mg via ORAL
  Filled 2018-08-23 (×3): qty 2

## 2018-08-23 MED ORDER — AMLODIPINE BESYLATE 5 MG PO TABS
5.0000 mg | ORAL_TABLET | Freq: Every day | ORAL | Status: DC
  Administered 2018-08-23 – 2018-08-25 (×3): 5 mg via ORAL
  Filled 2018-08-23 (×3): qty 1

## 2018-08-23 MED ORDER — INSULIN ASPART 100 UNIT/ML ~~LOC~~ SOLN: 0.0000 [IU] | Freq: Every day | SUBCUTANEOUS | Status: DC

## 2018-08-23 MED ORDER — INSULIN ASPART 100 UNIT/ML ~~LOC~~ SOLN
0.0000 [IU] | Freq: Three times a day (TID) | SUBCUTANEOUS | Status: DC
  Administered 2018-08-24: 2 [IU] via SUBCUTANEOUS
  Administered 2018-08-24: 3 [IU] via SUBCUTANEOUS
  Administered 2018-08-25 (×2): 2 [IU] via SUBCUTANEOUS

## 2018-08-23 MED ORDER — ORAL CARE MOUTH RINSE
15.0000 mL | Freq: Two times a day (BID) | OROMUCOSAL | Status: DC
  Administered 2018-08-23: 15 mL via OROMUCOSAL

## 2018-08-23 MED ORDER — CIPROFLOXACIN HCL 500 MG PO TABS
500.0000 mg | ORAL_TABLET | Freq: Two times a day (BID) | ORAL | Status: DC
  Administered 2018-08-23 – 2018-08-24 (×2): 500 mg via ORAL
  Filled 2018-08-23 (×3): qty 1

## 2018-08-23 NOTE — Progress Notes (Signed)
Patient setup on CPAP for the night with auto settings. Patient stated she wears 40 at home, so auto mode was set using max and min of 4 cmH2O and 20 cmH2O. Patient said pressure was good and the CPAP is doing okay for now. She will call if any further assistance needed.

## 2018-08-23 NOTE — Progress Notes (Signed)
NAME:  Stephanie [name], MRN:  [id number], DOB:  03-20-1966, LOS: 2 ADMISSION DATE:  08/21/2018, CONSULTATION DATE:  08/21/2018 REFERRING MD:  Dr. Juleen China, CHIEF COMPLAINT:  AMS  Brief History   81 yoF presenting with slurred speech, multiple falls at home, and somnolence requiring intubation for airway protection and respiratory acidosis.  Additionally hypotensive despite 3L NS requiring low dose NE and bradycardic into the 40's. +UTI, CTH pending.   History of present illness   HPI obtained from medical chart review as patient is encephalopathic on mechanical ventilation. No family present at bedside.   52 year old female with history significant for tobacco abuse, DM, HTN, and prior stroke who presents from home with altered mental status.    Patient represented to urgent care with complaints of back pain, nausea, back pain and bruising, slurred speech and several falls at home.  Patient on plavix only.  Patient nor husband able to give clear history.  Unknown baseline or if residual affects from prior stroke.  She was noted to be lethargic and sent to [facility] ER for further eval.  In the ER, she was lethargic and sonorous.  ABG showed respiratory acidosis.  CXR noted for mild vascular congestion.  She was intubated and noted to be difficult airway.  Additionally, she was afebrile and hypotensive, requiring 3L NS and low dose peripheral levophed, and bradycardic in the 40's. Atropine given without change in HR.  EKG showing first degree AV block, abnormal labs noted for plts 135, LA 2.36 ->1.45, +UTI.  CT head / cervical pending.  PCCM called for admit.   Of note, on pharm review, pharmacy notes that patient had her prozac filled at 3 different pharmacies within days of each other this month.  Past Medical History  Tobacco abuse, DM, HTN, IBS, stroke, headache, treated Hep C, kidney stones, diverticulitis   Significant Hospital Events   12/13 Admitted  Consults:   Procedures:  12/13 ETT  (difficult intubation) >>  Significant Diagnostic Tests:  12/13 CTH/ cervical spine >> no acute findings on head CT, cervical spine without fracture of muscle 12/13 CT abd/pelvis >> I meant question left lower lobe atelectasis versus pneumonia, no hydronephrosis but some scattered calcifications  Micro Data:  12/13 MRSA PCR >> negative 12/13 BCx2 >> 12/13 UC >> 20 K CFU yeast  Antimicrobials:  12/13 aztreonam >> 12/14 12/13 vanc >> 12/14 Cipro 12/14 >>   Interim history/subjective:  Feels better, little memory of the last few days.  States that she has been taking her home meds correctly.  Wants to go home.    Objective   Blood pressure (!) 142/91, pulse 92, temperature 98 F (36.7 C), temperature source Oral, resp. rate 19, weight 102.6 kg, SpO2 100 %.        Intake/Output Summary (Last 24 hours) at 08/23/2018 1521 Last data filed at 08/23/2018 1400 Gross per 24 hour  Intake 2412.11 ml  Output 2420 ml  Net -7.89 ml   Filed Weights   08/21/18 2100 08/22/18 0344 08/23/18 0409  Weight: 88 kg 102.9 kg 102.6 kg   Examination: General:  Obese WF on MV HEENT: MM pink/moist, ETT at 22, OGT, pupils 3/reactive, anicteric  Neuro: now awake and following commands, nodding to questions.  CV: rr, 1deg AV block, no murmur, +1 distal pulses PULM: clear, decreased at bases GI: soft, +BS Extremities: cool /dry, no LE edema  Skin: no rash, contusion on back  Resolved Hospital Problem list    Assessment &  Plan:  Acute encephalopathy - reported falls and slurred speech -Question related to medications, sepsis/UTI - ddx include intracranial process vs toxic/metabolic process vs ? Overdose/ prozac/  since patient refilled rx at 3 different pharmacies  P:  Unclear etiology, suspect medications and UTI superimposed on baseline dementia.  She denies misusing fluoxetine.  Note that she also has Xanax and Ambien on her med list. Restart her home medications slowly and carefully to avoid  encephalopathy and respiratory suppression  Acute respiratory failure, resolved Tobacco abuse  P:  Pulmonary hygiene Stop scheduled nebulizers  Hypotension, resolved - possibly related to sepsis although afebrile and normal WBC, could be dehydration, meds - large bruising to back however Hgb is stable P:  Antibiotics as below Consider TTE if recurrent hypotension  Bradycardia - first degree AV block on EKG, no prior hx of bradycardia.  Patient is on CCB and BB at home P:  Dopamine weaned to off Follow EKG, QTC  UTI, CT abdomen without any hydronephrosis or obstructing stone. P:  Culture unremarkable Change Cipro to p.o. and complete 5 days total  Hx DM P:  Pain scale insulin per protocol Restart home diabetes regimen when she is taking good p.o.  Hx HTN/ HLD P:  Antihypertensive regimen on hold, reinitiate as she stabilizes from this acute event  Best practice:  Diet: Heart healthy carb modified Pain/Anxiety/Delirium protocol (if indicated): N/A VAP protocol (if indicated): yes DVT prophylaxis: SCD GI prophylaxis: N/A Glucose control: SSI Mobility: Bed with assist Code Status: Full  Family Communication: Discussed with family 12/14, with the patient on 12/15. Disposition: ICU  Labs   CBC: Recent Labs  Lab 08/21/18 1845 08/22/18 0720  WBC 8.6 10.3  NEUTROABS 4.5  --   HGB 13.0 11.0*  HCT 42.5 36.0  MCV 97.9 98.4  PLT 135* 141*    Basic Metabolic Panel: Recent Labs  Lab 08/21/18 1845 08/21/18 2138 08/22/18 0720  NA 142  --  143  K 6.0* 4.8 4.0  CL 109  --  111  CO2 23  --  20*  GLUCOSE 119*  --  156*  BUN 16  --  11  CREATININE 1.09*  --  0.78  CALCIUM 7.1*  --  7.7*  MG  --   --  1.5*  PHOS  --   --  2.3*   GFR: Estimated Creatinine Clearance: 91.3 mL/min (by C-G formula based on SCr of 0.78 mg/dL). Recent Labs  Lab 08/21/18 1845 08/21/18 1857 08/21/18 2139 08/21/18 2331 08/22/18 0720 08/23/18 0515  PROCALCITON  --   --   --  <0.10  <0.10 <0.10  WBC 8.6  --   --   --  10.3  --   LATICACIDVEN  --  2.36* 1.45  --   --   --     Liver Function Tests: Recent Labs  Lab 08/21/18 1845 08/22/18 0720  AST 27  --   ALT 20  --   ALKPHOS 112  --   BILITOT 0.7  --   PROT 6.6  --   ALBUMIN 3.6 2.5*   No results for input(s): LIPASE, AMYLASE in the last 168 hours. No results for input(s): AMMONIA in the last 168 hours.  ABG    Component Value Date/Time   PHART 7.318 (L) 08/22/2018 0723   PCO2ART 40.6 08/22/2018 0723   PO2ART 71.0 (L) 08/22/2018 0723   HCO3 20.9 08/22/2018 0723   TCO2 22 08/22/2018 0723   ACIDBASEDEF 5.0 (H) 08/22/2018 [id number]  O2SAT 93.0 08/22/2018 0723     Coagulation Profile: Recent Labs  Lab 08/21/18 1925  INR 0.97    Cardiac Enzymes: Recent Labs  Lab 08/21/18 2331 08/22/18 0720  CKTOTAL  --  137  TROPONINI <0.03 <0.03    HbA1C: Hgb A1c MFr Bld  Date/Time Value Ref Range Status  04/22/2016 03:57 AM 8.1 (H) 4.8 - 5.6 % Final    Comment:    (NOTE)         Pre-diabetes: 5.7 - 6.4         Diabetes: >6.4         Glycemic control for adults with diabetes: <7.0     CBG: Recent Labs  Lab 08/22/18 1951 08/23/18 0006 08/23/18 0351 08/23/18 0811 08/23/18 1229  GLUCAP 116* 123* 122* 117* 173*      Levy Pupa, MD, PhD 08/23/2018, 3:21 PM [facility] [phone] or if no answer [phone]

## 2018-08-24 ENCOUNTER — Other Ambulatory Visit: Payer: Self-pay

## 2018-08-24 ENCOUNTER — Encounter (HOSPITAL_COMMUNITY): Payer: Self-pay

## 2018-08-24 DIAGNOSIS — J9690 Respiratory failure, unspecified, unspecified whether with hypoxia or hypercapnia: Secondary | ICD-10-CM

## 2018-08-24 LAB — BASIC METABOLIC PANEL
Anion gap: 11 (ref 5–15)
BUN: 7 mg/dL (ref 6–20)
CHLORIDE: 106 mmol/L (ref 98–111)
CO2: 25 mmol/L (ref 22–32)
Calcium: 8.5 mg/dL — ABNORMAL LOW (ref 8.9–10.3)
Creatinine, Ser: 0.61 mg/dL (ref 0.44–1.00)
GFR calc Af Amer: >60 mL/min (ref >60)
GFR calc non Af Amer: >60 mL/min (ref >60)
Glucose, Bld: 136 mg/dL — ABNORMAL HIGH (ref 70–99)
Potassium: 3.5 mmol/L (ref 3.5–5.1)
Sodium: 142 mmol/L (ref 135–145)

## 2018-08-24 LAB — CBC
HCT: 35.4 % — ABNORMAL LOW (ref 36.0–46.0)
HEMOGLOBIN: 11.2 g/dL — AB (ref 12.0–15.0)
MCH: 30.1 pg (ref 26.0–34.0)
MCHC: 31.6 g/dL (ref 30.0–36.0)
MCV: 95.2 fL (ref 80.0–100.0)
Platelets: 133 10*3/uL — ABNORMAL LOW (ref 150–400)
RBC: 3.72 MIL/uL — ABNORMAL LOW (ref 3.87–5.11)
RDW: 14.5 % (ref 11.5–15.5)
WBC: 8.6 10*3/uL (ref 4.0–10.5)
nRBC: 0 % (ref 0.0–0.2)

## 2018-08-24 LAB — GLUCOSE, CAPILLARY
GLUCOSE-CAPILLARY: 163 mg/dL — AB (ref 70–99)
Glucose-Capillary: 158 mg/dL — ABNORMAL HIGH (ref 70–99)
Glucose-Capillary: 147 mg/dL — ABNORMAL HIGH (ref 70–99)
Glucose-Capillary: 123 mg/dL — ABNORMAL HIGH (ref 70–99)
Glucose-Capillary: 146 mg/dL — ABNORMAL HIGH (ref 70–99)
Glucose-Capillary: 158 mg/dL — ABNORMAL HIGH (ref 70–99)
Glucose-Capillary: 134 mg/dL — ABNORMAL HIGH (ref 70–99)
Glucose-Capillary: 135 mg/dL — ABNORMAL HIGH (ref 70–99)

## 2018-08-24 MED ORDER — DILTIAZEM HCL ER COATED BEADS 180 MG PO CP24
180.0000 mg | ORAL_CAPSULE | Freq: Every day | ORAL | Status: DC
  Administered 2018-08-24 – 2018-08-25 (×2): 180 mg via ORAL
  Filled 2018-08-24 (×2): qty 1

## 2018-08-24 MED ORDER — POTASSIUM CHLORIDE CRYS ER 20 MEQ PO TBCR
40.0000 meq | EXTENDED_RELEASE_TABLET | Freq: Once | ORAL | Status: AC
  Administered 2018-08-24: 40 meq via ORAL
  Filled 2018-08-24: qty 2

## 2018-08-24 MED ORDER — ENOXAPARIN SODIUM 40 MG/0.4ML ~~LOC~~ SOLN
40.0000 mg | SUBCUTANEOUS | Status: DC
  Administered 2018-08-24 – 2018-08-25 (×2): 40 mg via SUBCUTANEOUS
  Filled 2018-08-24 (×2): qty 0.4

## 2018-08-24 MED ORDER — IPRATROPIUM-ALBUTEROL 0.5-2.5 (3) MG/3ML IN SOLN
3.0000 mL | RESPIRATORY_TRACT | Status: DC | PRN
  Administered 2018-08-24: 3 mL via RESPIRATORY_TRACT
  Filled 2018-08-24: qty 3

## 2018-08-24 MED ORDER — ACETAMINOPHEN 325 MG PO TABS: 650.0000 mg | ORAL_TABLET | Freq: Four times a day (QID) | ORAL | Status: DC | PRN

## 2018-08-24 MED ORDER — FUROSEMIDE 10 MG/ML IJ SOLN
20.0000 mg | Freq: Once | INTRAMUSCULAR | Status: AC
  Administered 2018-08-24: 20 mg via INTRAVENOUS
  Filled 2018-08-24: qty 2

## 2018-08-24 NOTE — Progress Notes (Signed)
PROGRESS NOTE    Stephanie GuerinDianna B [name]  ZOX:[id number] DOB: 02-10-1966 DOA: 08/21/2018 PCP: Fayrene HelperBoswell, Chelsa H, NP  Brief Narrative: [facility] -This is a [age] year old obese female with COPD, sleep apnea, diabetes, obesity, hypertension, chronic back pain presented to our emergency room with somnolence multiple falls slurring of speech found to have severe hypercarbic respiratory acidosis she was intubated in the emergency room, sedating medications held. -There is also concern for possible UTI and treated with broad-spectrum antibiotics -Stabilized, extubated, [facility] -CT abdomen pelvis 12/13 showed left lower lobe atelectasis versus pneumonia  Assessment & Plan:   Toxic metabolic encephalopathy -Felt to be secondary to medications worsened by hypercarbia from COPD/OSA -Apparently failed Prozac at 3 different pharmacies recently -In addition also on Xanax, Ambien, Phenergan, Lyrica, hydrocodone, all these were held -There was also concern for possible UTI -Urine cultures negative -Procalcitonin level very low -Discontinue antibiotics and monitor -PT/OT eval -Restarted on Prozac, avoid resuming other medications if possible  Acute hypoxic and hypercarbic respiratory failure -Suspect underlying COPD at baseline, 30-40-pack-year smoker -Extubated, wean down O2  Hypotension -Probably related to medications, dehydration -Clinically no evidence of sepsis at this time -Discontinue antibiotics  History of diabetes mellitus type 2 -Glipizide on hold, CBGs are stable less than 150 -Continue sensitive sliding scale  Transient bradycardia -While in ICU with first-degree AV block -Likely related to critical illness, ongoing respiratory failure on sedation at the time -Now with sinus tachycardia, restart home dose of Cardizem CD -Metoprolol on hold, will consider restarting this in the next 24 hours depending on heart rate  DVT prophylaxis:  Start Lovenox Code Status: Full code Family Communication: No family at bedside Disposition Plan: Home likely 24 to 48 hours  Consultants:   PCCM Tx   Procedures:   Antimicrobials:    Subjective: -Feels better, did not use BiPAP last night, denies any dyspnea   Objective: Vitals:   08/23/18 2321 08/23/18 2356 08/24/18 0526 08/24/18 0733  BP: (!) 166/96   (!) 158/89  Pulse: 96 (!) 102  98  Resp: (!) 28 (!) 22  16  Temp: 98.1 F (36.7 C)   98.5 F (36.9 C)  TempSrc: Oral   Oral  SpO2: 95% 95% 94% 95%  Weight:   102.8 kg     Intake/Output Summary (Last 24 hours) at 08/24/2018 0959 Last data filed at 08/23/2018 1600 Gross per 24 hour  Intake 1058.34 ml  Output 1350 ml  Net -291.66 ml   Filed Weights   08/22/18 0344 08/23/18 0409 08/24/18 0526  Weight: 102.9 kg 102.6 kg 102.8 kg    Examination:  General exam: Obese, chronically ill-appearing female, somnolent, arousable and appropriate, oriented x2 Respiratory system: Poor air movement, no expiratory wheezes, decreased at both bases Cardiovascular system: S1-S2/tachycardic   Gastrointestinal system: Abdomen is nondistended, soft and nontender.Normal bowel sounds heard. Central nervous system: Alert and oriented. No focal neurological deficits. Extremities: Trace edema  skin: No rashes, lesions or ulcers Psychiatry: Judgement and insight appear normal. Mood & affect appropriate.     Data Reviewed:   CBC: Recent Labs  Lab 08/21/18 1845 08/22/18 0720 08/24/18 0237  WBC 8.6 10.3 8.6  NEUTROABS 4.5  --   --   HGB 13.0 11.0* 11.2*  HCT 42.5 36.0 35.4*  MCV 97.9 98.4 95.2  PLT 135* 141* 133*   Basic Metabolic Panel: Recent Labs  Lab 08/21/18 1845 08/21/18 2138 08/22/18 0720 08/24/18 0237  NA 142  --  143 142  K 6.0* 4.8 4.0 3.5  CL 109  --  111 106  CO2 23  --  20* 25  GLUCOSE 119*  --  156* 136*  BUN 16  --  11 7  CREATININE 1.09*  --  0.78 0.61  CALCIUM 7.1*  --  7.7* 8.5*  MG  --   --   1.5*  --   PHOS  --   --  2.3*  --    GFR: Estimated Creatinine Clearance: 91.4 mL/min (by C-G formula based on SCr of 0.61 mg/dL). Liver Function Tests: Recent Labs  Lab 08/21/18 1845 08/22/18 0720  AST 27  --   ALT 20  --   ALKPHOS 112  --   BILITOT 0.7  --   PROT 6.6  --   ALBUMIN 3.6 2.5*   No results for input(s): LIPASE, AMYLASE in the last 168 hours. No results for input(s): AMMONIA in the last 168 hours. Coagulation Profile: Recent Labs  Lab 08/21/18 1925  INR 0.97   Cardiac Enzymes: Recent Labs  Lab 08/21/18 2331 08/22/18 0720  CKTOTAL  --  137  TROPONINI <0.03 <0.03   BNP (last 3 results) No results for input(s): PROBNP in the last 8760 hours. HbA1C: No results for input(s): HGBA1C in the last 72 hours. CBG: Recent Labs  Lab 08/23/18 1619 08/23/18 1948 08/23/18 2050 08/24/18 0114 08/24/18 0729  GLUCAP 158* 147* 163* 123* 146*   Lipid Profile: No results for input(s): CHOL, HDL, LDLCALC, TRIG, CHOLHDL, LDLDIRECT in the last 72 hours. Thyroid Function Tests: Recent Labs    08/21/18 2331  TSH 1.241   Anemia Panel: No results for input(s): VITAMINB12, FOLATE, FERRITIN, TIBC, IRON, RETICCTPCT in the last 72 hours. Urine analysis:    Component Value Date/Time   COLORURINE YELLOW 08/21/2018 1817   APPEARANCEUR TURBID (A) 08/21/2018 1817   APPEARANCEUR Clear 03/30/2014 1354   LABSPEC 1.023 08/21/2018 1817   LABSPEC 1.015 03/30/2014 1354   PHURINE 5.0 08/21/2018 1817   GLUCOSEU NEGATIVE 08/21/2018 1817   GLUCOSEU Negative 03/30/2014 1354   HGBUR NEGATIVE 08/21/2018 1817   BILIRUBINUR NEGATIVE 08/21/2018 1817   BILIRUBINUR Negative 03/30/2014 1354   KETONESUR 5 (A) 08/21/2018 1817   PROTEINUR 100 (A) 08/21/2018 1817   UROBILINOGEN 0.2 02/25/2015 0049   NITRITE NEGATIVE 08/21/2018 1817   LEUKOCYTESUR MODERATE (A) 08/21/2018 1817   LEUKOCYTESUR Trace 03/30/2014 1354   Sepsis Labs: @LABRCNTIP (procalcitonin:4,lacticidven:4)  ) Recent  Results (from the past 240 hour(s))  Culture, blood (Routine x 2)     Status: None (Preliminary result)   Collection Time: 08/21/18  6:45 PM  Result Value Ref Range Status   Specimen Description SITE NOT SPECIFIED  Final   Special Requests   Final    BOTTLES DRAWN AEROBIC AND ANAEROBIC Blood Culture results may not be optimal due to an inadequate volume of blood received in culture bottles   Culture   Final    NO GROWTH 3 DAYS Performed at [facility], [address], [place], [place] [address]    Report Status PENDING  Incomplete  Culture, blood (Routine x 2)     Status: None (Preliminary result)   Collection Time: 08/21/18  7:15 PM  Result Value Ref Range Status   Specimen Description BLOOD RIGHT ANTECUBITAL  Final   Special Requests   Final    BOTTLES DRAWN AEROBIC AND ANAEROBIC Blood Culture adequate volume   Culture   Final    NO GROWTH 3  DAYS Performed at [facility], [address] [place], [place] [address]    Report Status PENDING  Incomplete  Culture, Urine     Status: Abnormal   Collection Time: 08/21/18  9:22 PM  Result Value Ref Range Status   Specimen Description URINE, RANDOM  Final   Special Requests   Final    NONE Performed at [facility], [address] [place], [place] [address]    Culture 20,000 COLONIES/mL YEAST (A)  Final   Report Status 08/23/2018 FINAL  Final  MRSA PCR Screening     Status: None   Collection Time: 08/22/18 12:39 AM  Result Value Ref Range Status   MRSA by PCR NEGATIVE NEGATIVE Final    Comment:        The GeneXpert MRSA Assay (FDA approved for NASAL specimens only), is one component of a comprehensive MRSA colonization surveillance program. It is not intended to diagnose MRSA infection nor to guide or monitor treatment for MRSA infections. Performed at [facility], [address] [place], [place] [address]          Radiology Studies: No results found.      Scheduled Meds: .  amLODipine  5 mg Oral Daily  . ciprofloxacin  500 mg Oral BID  . cloNIDine  0.1 mg Oral BID  . clopidogrel  75 mg Oral Daily  . FLUoxetine  40 mg Oral Daily  . furosemide  20 mg Intravenous Once  . insulin aspart  0-15 Units Subcutaneous TID WC  . insulin aspart  0-5 Units Subcutaneous QHS  . potassium chloride  40 mEq Oral Once   Continuous Infusions: . sodium chloride Stopped (08/23/18 1519)     LOS: 3 days    Time spent:    Zannie Cove, MD [facility] Page via [email or web address], password [email or web address] After 7PM please contact night-coverage  08/24/2018, 9:59 AM

## 2018-08-24 NOTE — Progress Notes (Signed)
Patient placed on CPAP and tolerating well. Will wear as long as she can tolerate she stated. No issues at this time.

## 2018-08-25 LAB — BASIC METABOLIC PANEL
Anion gap: 9 (ref 5–15)
BUN: 6 mg/dL (ref 6–20)
CO2: 27 mmol/L (ref 22–32)
Calcium: 8.7 mg/dL — ABNORMAL LOW (ref 8.9–10.3)
Chloride: 102 mmol/L (ref 98–111)
Creatinine, Ser: 0.58 mg/dL (ref 0.44–1.00)
GFR calc Af Amer: >60 mL/min (ref >60)
GFR calc non Af Amer: >60 mL/min (ref >60)
Glucose, Bld: 137 mg/dL — ABNORMAL HIGH (ref 70–99)
POTASSIUM: 3.5 mmol/L (ref 3.5–5.1)
Sodium: 138 mmol/L (ref 135–145)

## 2018-08-25 LAB — CBC
HCT: 36.7 % (ref 36.0–46.0)
Hemoglobin: 11.4 g/dL — ABNORMAL LOW (ref 12.0–15.0)
MCH: 29.1 pg (ref 26.0–34.0)
MCHC: 31.1 g/dL (ref 30.0–36.0)
MCV: 93.6 fL (ref 80.0–100.0)
NRBC: 0 % (ref 0.0–0.2)
Platelets: 153 10*3/uL (ref 150–400)
RBC: 3.92 MIL/uL (ref 3.87–5.11)
RDW: 14.3 % (ref 11.5–15.5)
WBC: 7.2 10*3/uL (ref 4.0–10.5)

## 2018-08-25 LAB — GLUCOSE, CAPILLARY
Glucose-Capillary: 143 mg/dL — ABNORMAL HIGH (ref 70–99)
Glucose-Capillary: 149 mg/dL — ABNORMAL HIGH (ref 70–99)
Glucose-Capillary: 122 mg/dL — ABNORMAL HIGH (ref 70–99)

## 2018-08-25 MED ORDER — METOPROLOL SUCCINATE ER 25 MG PO TB24
25.0000 mg | ORAL_TABLET | Freq: Every day | ORAL | Status: DC
  Administered 2018-08-25: 25 mg via ORAL
  Filled 2018-08-25: qty 1

## 2018-08-25 NOTE — Progress Notes (Signed)
PROGRESS NOTE    Stephanie [name]  ZOX:[id number] DOB: Nov 19, 1965 DOA: 08/21/2018 PCP: Fayrene HelperBoswell, Chelsa H, NP [name] Brief Narrative: [facility] -This is a 52 year old obese female with COPD, sleep apnea, diabetes, obesity, hypertension, chronic back pain presented to our emergency room with somnolence multiple falls slurring of speech found to have severe hypercarbic respiratory acidosis, due to COPD/OSA and polypharmacy she was intubated in the emergency room, sedating medications held. -There is also concern for possible UTI and treated with broad-spectrum antibiotics -Stabilized, extubated, [facility] -CT abdomen pelvis 12/13 showed left lower lobe atelectasis versus pneumonia  Assessment & Plan:   Toxic metabolic encephalopathy -Felt to be secondary to medications worsened by hypercarbia from COPD/OSA -Apparently failed Prozac at 3 different pharmacies recently -In addition also on Xanax, Ambien, Phenergan, Lyrica, hydrocodone, all these were held -There was also concern for possible UTI, urine cultures were negative, antibiotics discontinued yesterday, procalcitonin level was very low -PT OT eval pending -Prozac was restarted, trying to avoid resuming other sedating medications if possible -Educated patient regarding effect of polypharmacy with chronic lung disease -Discharge planning  Acute hypoxic and hypercarbic respiratory failure -Suspect underlying COPD at baseline, 30-40-pack-year smoker -Status post VDRF, extubated, wean down O2  Hypotension -Probably related to medications, dehydration -Clinically no evidence of sepsis at this time -Pressure is high now, restarted clonidine, amlodipine, Cardizem and low-dose beta-blocker  History of diabetes mellitus type 2 -Glipizide on hold, CBGs are stable less than 150 -Continue sensitive sliding scale  Transient bradycardia -While in ICU with first-degree AV block -Likely related to  critical illness, ongoing respiratory failure on sedation at the time -Sinus tachycardia improving with resumption of Cardizem CD will restart metoprolol, monitor on telemetry   DVT prophylaxis:  Lovenox Code Status: Full code Family Communication: No family at bedside Disposition Plan: Home later today or in a.m.  Consultants:   PCCM Tx   Procedures:   Antimicrobials:    Subjective: -Breathing better, more awake and alert today, did not use BiPAP yesterday   Objective: Vitals:   08/24/18 1641 08/24/18 2159 08/24/18 2358 08/25/18 0856  BP: (!) 152/92 (!) 159/95  (!) 175/86  Pulse: 92 99 98 94  Resp: 15 16 20 18   Temp: 98.7 F (37.1 C) 98.7 F (37.1 C)  97.7 F (36.5 C)  TempSrc: Oral Oral    SpO2: 97% 98% 98% 93%  Weight: 102.8 kg     Height: 5\' 3"  (1.6 m)       Intake/Output Summary (Last 24 hours) at 08/25/2018 1015 Last data filed at 08/25/2018 0900 Gross per 24 hour  Intake 360 ml  Output 1300 ml  Net -940 ml   Filed Weights   08/23/18 0409 08/24/18 0526 08/24/18 1641  Weight: 102.6 kg 102.8 kg 102.8 kg    Examination:  Gen: Obese, chronically ill-appearing female, more awake and alert today, oriented to self place and person HEENT: PERRLA, Neck supple, no JVD Lungs: Poor air movement, no expiratory wheezes CVS: RRR,No Gallops,Rubs or new Murmurs Abd: soft, Non tender, non distended, BS present Extremities: Mild left hand swelling Skin: no new rashes Psychiatry: Judgement and insight appear normal. Mood & affect appropriate.     Data Reviewed:   CBC: Recent Labs  Lab 08/21/18 1845 08/22/18 0720 08/24/18 0237 08/25/18 0206  WBC 8.6 10.3 8.6 7.2  NEUTROABS 4.5  --   --   --   HGB 13.0 11.0* 11.2* 11.4*  HCT 42.5 36.0  35.4* 36.7  MCV 97.9 98.4 95.2 93.6  PLT 135* 141* 133* 153   Basic Metabolic Panel: Recent Labs  Lab 08/21/18 1845 08/21/18 2138 08/22/18 0720 08/24/18 0237 08/25/18 0206  NA 142  --  143 142 138  K 6.0* 4.8 4.0  3.5 3.5  CL 109  --  111 106 102  CO2 23  --  20* 25 27  GLUCOSE 119*  --  156* 136* 137*  BUN 16  --  11 7 6   CREATININE 1.09*  --  0.78 0.61 0.58  CALCIUM 7.1*  --  7.7* 8.5* 8.7*  MG  --   --  1.5*  --   --   PHOS  --   --  2.3*  --   --    GFR: Estimated Creatinine Clearance: 93.2 mL/min (by C-G formula based on SCr of 0.58 mg/dL). Liver Function Tests: Recent Labs  Lab 08/21/18 1845 08/22/18 0720  AST 27  --   ALT 20  --   ALKPHOS 112  --   BILITOT 0.7  --   PROT 6.6  --   ALBUMIN 3.6 2.5*   No results for input(s): LIPASE, AMYLASE in the last 168 hours. No results for input(s): AMMONIA in the last 168 hours. Coagulation Profile: Recent Labs  Lab 08/21/18 1925  INR 0.97   Cardiac Enzymes: Recent Labs  Lab 08/21/18 2331 08/22/18 0720  CKTOTAL  --  137  TROPONINI <0.03 <0.03   BNP (last 3 results) No results for input(s): PROBNP in the last 8760 hours. HbA1C: No results for input(s): HGBA1C in the last 72 hours. CBG: Recent Labs  Lab 08/24/18 0729 08/24/18 1116 08/24/18 1641 08/24/18 2150 08/25/18 0725  GLUCAP 146* 158* 134* 135* 143*   Lipid Profile: No results for input(s): CHOL, HDL, LDLCALC, TRIG, CHOLHDL, LDLDIRECT in the last 72 hours. Thyroid Function Tests: No results for input(s): TSH, T4TOTAL, FREET4, T3FREE, THYROIDAB in the last 72 hours. Anemia Panel: No results for input(s): VITAMINB12, FOLATE, FERRITIN, TIBC, IRON, RETICCTPCT in the last 72 hours. Urine analysis:    Component Value Date/Time   COLORURINE YELLOW 08/21/2018 1817   APPEARANCEUR TURBID (A) 08/21/2018 1817   APPEARANCEUR Clear 03/30/2014 1354   LABSPEC 1.023 08/21/2018 1817   LABSPEC 1.015 03/30/2014 1354   PHURINE 5.0 08/21/2018 1817   GLUCOSEU NEGATIVE 08/21/2018 1817   GLUCOSEU Negative 03/30/2014 1354   HGBUR NEGATIVE 08/21/2018 1817   BILIRUBINUR NEGATIVE 08/21/2018 1817   BILIRUBINUR Negative 03/30/2014 1354   KETONESUR 5 (A) 08/21/2018 1817   PROTEINUR 100  (A) 08/21/2018 1817   UROBILINOGEN 0.2 02/25/2015 0049   NITRITE NEGATIVE 08/21/2018 1817   LEUKOCYTESUR MODERATE (A) 08/21/2018 1817   LEUKOCYTESUR Trace 03/30/2014 1354   Sepsis Labs: @LABRCNTIP (procalcitonin:4,lacticidven:4)  ) Recent Results (from the past 240 hour(s))  Culture, blood (Routine x 2)     Status: None (Preliminary result)   Collection Time: 08/21/18  6:45 PM  Result Value Ref Range Status   Specimen Description BLOOD SITE NOT SPECIFIED  Final   Special Requests   Final    BOTTLES DRAWN AEROBIC AND ANAEROBIC Blood Culture results may not be optimal due to an inadequate volume of blood received in culture bottles   Culture   Final    NO GROWTH 4 DAYS Performed at [facility], [address], [place], [place] [address]    Report Status PENDING  Incomplete  Culture, blood (Routine x 2)     Status: None (  Preliminary result)   Collection Time: 08/21/18  7:15 PM  Result Value Ref Range Status   Specimen Description BLOOD RIGHT ANTECUBITAL  Final   Special Requests   Final    BOTTLES DRAWN AEROBIC AND ANAEROBIC Blood Culture adequate volume   Culture   Final    NO GROWTH 4 DAYS Performed at [facility], [address] [place], [place] [address]    Report Status PENDING  Incomplete  Culture, Urine     Status: Abnormal   Collection Time: 08/21/18  9:22 PM  Result Value Ref Range Status   Specimen Description URINE, RANDOM  Final   Special Requests   Final    NONE Performed at [facility], [address] [place], [place] [address]    Culture 20,000 COLONIES/mL YEAST (A)  Final   Report Status 08/23/2018 FINAL  Final  MRSA PCR Screening     Status: None   Collection Time: 08/22/18 12:39 AM  Result Value Ref Range Status   MRSA by PCR NEGATIVE NEGATIVE Final    Comment:        The GeneXpert MRSA Assay (FDA approved for NASAL specimens only), is one component of a comprehensive MRSA colonization surveillance program. It is not intended  to diagnose MRSA infection nor to guide or monitor treatment for MRSA infections. Performed at [facility], [address], [place], [place] [address]          Radiology Studies: No results found.      Scheduled Meds: . amLODipine  5 mg Oral Daily  . cloNIDine  0.1 mg Oral BID  . clopidogrel  75 mg Oral Daily  . diltiazem  180 mg Oral Daily  . enoxaparin (LOVENOX) injection  40 mg Subcutaneous Q24H  . FLUoxetine  40 mg Oral Daily  . insulin aspart  0-15 Units Subcutaneous TID WC  . insulin aspart  0-5 Units Subcutaneous QHS  . metoprolol succinate  25 mg Oral Daily   Continuous Infusions: . sodium chloride Stopped (08/23/18 1519)     LOS: 4 days    Time spent:    Zannie Cove, MD [facility] Page via [email or web address], password [email or web address] After 7PM please contact night-coverage  08/25/2018, 10:15 AM

## 2018-08-25 NOTE — Care Management Note (Signed)
Case Management Note  Patient Details  Name: Elayne GuerinDianna B Fenoglio MRN: [id number] Date of Birth: 09-12-1965  Subjective/Objective:  From home , resp failure, for dc today or tomorrow, patient states she does not drive so she will need HH services to come to her home.  NCM offered choice, patient chose [facility] , they can not take referral,  NCM tried [facility], they can not take referral,  [facility]. [facility]  Soc will begin 24-48 hrs post dc.                                  Action/Plan: DC home when ready.  Expected Discharge Date:                  Expected Discharge Plan:  Home w Home Health Services  In-House Referral:     Discharge planning Services  CM Consult  Post Acute Care Choice:  Home Health Choice offered to:  Patient  DME Arranged:    DME Agency:     HH Arranged:  PT HH [facility]  Status of Service:  Completed, signed off  If discussed at Long Length of Stay Meetings, dates discussed:    Additional Comments:  Leone Havenaylor, Chalese Peach [place], RN 08/25/2018, 1:09 PM

## 2018-08-25 NOTE — Evaluation (Signed)
Occupational Therapy Evaluation Patient Details Name: Stephanie GuerinDianna B Couey MRN: [id number] DOB: [birth year] Today's Date: 08/25/2018    History of Present Illness Patient is a 52 y/o female presenting to the ED on 08/21/18 due to several falls, slurred speech.  Intubated on 08/21/18, extubated on 08/24/18. Admitted with Toxic metabolic encephalopathy. PMH significant for COPD, sleep apnea, diabetes, obesity, hypertension, chronic back pain.   Clinical Impression   Patient evaluated by Occupational Therapy with no further acute OT needs identified. All education has been completed and the patient has no further questions. Pt is able to perform ADLs at supervision level.  DOE 3/4 - 4/4 with sats 94% on RA and HR 115.   She feels she is close to baseline, and reports she is fairly sedentary at home.   See below for any follow-up Occupational Therapy or equipment needs. OT is signing off. Thank you for this referral.      Follow Up Recommendations  No OT follow up;Supervision - Intermittent    Equipment Recommendations  None recommended by OT - may benefit from pulmonary rehab.    Recommendations for Other Services       Precautions / Restrictions Precautions Precautions: Fall Precaution Comments: watch O2 Restrictions Weight Bearing Restrictions: No      Mobility Bed Mobility Overal bed mobility: Independent                Transfers Overall transfer level: Needs assistance Equipment used: None Transfers: Sit to/from Stand;Stand Pivot Transfers Sit to Stand: Supervision Stand pivot transfers: Supervision       General transfer comment: for safety     Balance Overall balance assessment: Needs assistance Sitting-balance support: No upper extremity supported;Feet supported Sitting balance-Leahy Scale: Good     Standing balance support: No upper extremity supported;During functional activity Standing balance-Leahy Scale: Good                              ADL either performed or assessed with clinical judgement   ADL                                         General ADL Comments: Pt is able to perform ADLs with supervision - DOE 3/4 - 4/4 with sats 94% on RA.   Pt voices that she frequently takes rest breaks and breaks activity up into small portions to pace herself      Vision         Perception     Praxis      Pertinent Vitals/Pain Pain Assessment: No/denies pain     Hand Dominance Right   Extremity/Trunk Assessment Upper Extremity Assessment Upper Extremity Assessment: Generalized weakness   Lower Extremity Assessment Lower Extremity Assessment: Generalized weakness   Cervical / Trunk Assessment Cervical / Trunk Assessment: Normal   Communication Communication Communication: No difficulties   Cognition Arousal/Alertness: Awake/alert Behavior During Therapy: WFL for tasks assessed/performed Overall Cognitive Status: Within Functional Limits for tasks assessed                                     General Comments       Exercises     Shoulder Instructions      Home Living Family/patient expects to be discharged to:: Private residence Living  Arrangements: Spouse/significant other Available Help at Discharge: Family;Available PRN/intermittently Type of Home: House Home Access: Stairs to enter [employer] of Steps: 4 Entrance Stairs-Rails: Right Home Layout: One level     Bathroom Shower/Tub: Producer, television/film/video: Standard     Home Equipment: Gilmer Mor - single point   Additional Comments: Pt reports she lives with spouse, but is by herself most of the time with her 7 dogs.        Prior Functioning/Environment Level of Independence: Independent;Needs assistance  Gait / Transfers Assistance Needed: Pt reports she is fairly sendentary.  She reports she ambulates at times with [facility] when she goes to [employer]  ADL's / Homemaking Assistance Needed: Pt  reports she is mod I with ADLs and requires assist with IADLs.  SHe reports she does not drive             OT Problem List: Decreased strength;Decreased activity tolerance;Cardiopulmonary status limiting activity      OT Treatment/Interventions:      OT Goals(Current goals can be found in the care plan section) Acute Rehab OT Goals Patient Stated Goal: to go home to dogs  OT Goal Formulation: All assessment and education complete, DC therapy  OT Frequency:     Barriers to D/C:            Co-evaluation              AM-PAC OT "6 Clicks" Daily Activity     Outcome Measure Help from another person eating meals?: None Help from another person taking care of personal grooming?: None Help from another person toileting, which includes using toliet, bedpan, or urinal?: None Help from another person bathing (including washing, rinsing, drying)?: None Help from another person to put on and taking off regular upper body clothing?: None Help from another person to put on and taking off regular lower body clothing?: None 6 Click Score: 24   End of Session Equipment Utilized During Treatment: Gait belt Nurse Communication: Mobility status  Activity Tolerance: Patient tolerated treatment well Patient left: in bed;with call bell/phone within reach;with bed alarm set  OT Visit Diagnosis: Muscle weakness (generalized) (M62.81)                Time: 1610-9604 OT Time Calculation (min): 14 min Charges:  OT General Charges $OT Visit: 1 Visit OT Evaluation $OT Eval Moderate Complexity: 1 Mod  Jeani Hawking, OTR/L Acute Rehabilitation Services Pager [phone] Office [phone]   Jeani Hawking M 08/25/2018, 12:49 PM

## 2018-08-25 NOTE — Discharge Summary (Signed)
Physician Discharge Summary  Stephanie [name] ZOX:[name]:[id number] DOB: 08/20/1966 DOA: 08/21/2018  PCP: Fayrene HelperBoswell, Chelsa H, NP  Admit date: 08/21/2018 Discharge date: 08/25/2018  Time spent: 45 minutes  Recommendations for Outpatient Follow-up:  1. Patient was on 6 sedating medications, 5 of them have been discontinued, caution with polypharmacy   Discharge Diagnoses:  Acute on chronic hypercarbic respiratory failure Toxic metabolic encephalopathy Polypharmacy COPD Sleep apnea   UTI (urinary tract infection)   Acute encephalopathy   Hypotension   Bradycardia   Type 2 diabetes mellitus (HCC)   Discharge Condition: sTable  Diet recommendation: Diabetic, heart healthy  Filed Weights   08/23/18 0409 08/24/18 0526 08/24/18 1641  Weight: 102.6 kg 102.8 kg 102.8 kg    History of present illness:   52 year old obese female with COPD, sleep apnea, diabetes, obesity, hypertension, chronic back pain presented to our emergency room with somnolence multiple falls slurring of speech found to have severe hypercarbic respiratory acidosis, due to COPD/OSA and polypharmacy she was intubated in the emergency room, sedating medications held.  Hospital Course:  Toxic metabolic encephalopathy -Felt to be secondary to medications worsened by hypercarbia from COPD/OSA -Apparently failed Prozac at 3 different pharmacies recently -In addition also on Xanax, Ambien, Phenergan, Lyrica, hydrocodone, all these were held -There was also concern for possible UTI, urine cultures were negative, antibiotics discontinued yesterday, procalcitonin level was very low -PT OT eval completed, set up with home health physical therapy today at discharge -Prozac was restarted, discontinued other medications as listed above  -Educated patient regarding effect of polypharmacy with chronic lung disease -Discharged home in a stable condition advised quick follow-up with PCP in 1 week  Acute hypoxic and hypercarbic  respiratory failure -Suspect underlying COPD at baseline, 30-40-pack-year smoker -Status post VDRF, extubated, weaned off O2  Hypotension -Probably related to medications, dehydration -Clinically no evidence of sepsis at this time -Pressure is high now, restarted clonidine, amlodipine, Cardizem and low-dose beta-blocker  History of diabetes mellitus type 2 -Glipizide resumed  Transient bradycardia -While in ICU with first-degree AV block -Likely related to critical illness, ongoing respiratory failure on sedation at the time -Resolved, subsequently with sinus tachycardia improving with resumption of Cardizem CD & metoprolol,  -now improved    Discharge Exam: Vitals:   08/24/18 2358 08/25/18 0856  BP:  (!) 175/86  Pulse: 98 94  Resp: 20 18  Temp:  97.7 F (36.5 C)  SpO2: 98% 93%    General: AAOx3 Cardiovascular: S1S2/RRR Respiratory: CTAB  Discharge Instructions   Discharge Instructions    Diet - low sodium heart healthy   Complete by:  As directed    Increase activity slowly   Complete by:  As directed      Allergies as of 08/25/2018      Reactions   Gabapentin Itching   Tylenol [acetaminophen] Itching   Aspirin Itching   Buprenorphine Hcl Itching   Codeine Itching, Other (See Comments)   Makes her feel like something is crawling under her skin. Can take, sometimes makes her itch   Compazine Itching, Other (See Comments)   "makes my skin crawl"   Ioxaglate Itching   Ivp Dye [iodinated Diagnostic Agents] Itching, Other (See Comments)   Makes her itch really bad. Had to get 2-3 shots of Benadryl when she was in the hospital.   Metrizamide Itching   Naproxen Itching   Norco [hydrocodone-acetaminophen] Itching   Penicillin G Hives, Rash, Other (See Comments)   Has patient had a PCN reaction causing immediate  rash, facial/tongue/throat swelling, SOB or lightheadedness with hypotension: Yes Has patient had a PCN reaction causing severe rash involving mucus  membranes or skin necrosis: No Has patient had a PCN reaction that required hospitalization: No Has patient had a PCN reaction occurring within the last 10 years: No If all of the above answers are "NO", then may proceed with Cephalosporin use.   Penicillins Itching, Other (See Comments)   Has patient had a PCN reaction causing immediate rash, facial/tongue/throat swelling, SOB or lightheadedness with hypotension: Yes Has patient had a PCN reaction causing severe rash involving mucus membranes or skin necrosis: No Has patient had a PCN reaction that required hospitalization: No Has patient had a PCN reaction occurring within the last 10 years: No If all of the above answers are "NO", then may proceed with Cephalosporin use.   Prochlorperazine Maleate Other (See Comments)   Compazine makes her skin crawl - needs 2-3 shots of Benadryl to get relief.   Reglan [metoclopramide] Itching, Other (See Comments)   Makes her skin crawl   Sulfur Hives, Rash, Other (See Comments)   Skin Rashes, Hives   Tegretol [carbamazepine] Itching, Other (See Comments)   Makes her feel like something is crawling under her skin.   Toradol [ketorolac Tromethamine] Itching   Tramadol Itching   Zofran [ondansetron Hcl] Itching      Medication List    STOP taking these medications   alprazolam 2 MG tablet Commonly known as:  XANAX   ciprofloxacin 500 MG tablet Commonly known as:  CIPRO   HYDROcodone-acetaminophen 5-325 MG tablet Commonly known as:  NORCO/VICODIN   losartan 100 MG tablet Commonly known as:  COZAAR   pregabalin 50 MG capsule Commonly known as:  LYRICA   promethazine 25 MG tablet Commonly known as:  PHENERGAN   zolpidem 10 MG tablet Commonly known as:  AMBIEN     TAKE these medications   amLODipine 5 MG tablet Commonly known as:  NORVASC Take 5 mg by mouth daily.   atorvastatin 40 MG tablet Commonly known as:  LIPITOR Take 40 mg by mouth every evening.   cloNIDine 0.1 MG  tablet Commonly known as:  CATAPRES Take 0.1 mg by mouth 2 (two) times daily.   clopidogrel 75 MG tablet Commonly known as:  PLAVIX Take 1 tablet (75 mg total) by mouth daily.   diltiazem 180 MG 24 hr capsule Commonly known as:  CARDIZEM CD Take 180 mg by mouth daily.   EMGALITY 120 MG/ML Sosy Generic drug:  Galcanezumab-gnlm Inject 120 mg into the skin every 30 (thirty) days.   FLUoxetine 40 MG capsule Commonly known as:  PROZAC Take 40 mg by mouth daily.   glipiZIDE 5 MG tablet Commonly known as:  GLUCOTROL Take 1 tablet (5 mg total) by mouth 2 (two) times daily before a meal.   loratadine 10 MG tablet Commonly known as:  CLARITIN Take 10 mg by mouth daily.   metoprolol succinate 50 MG 24 hr tablet Commonly known as:  TOPROL-XL Take 50 mg by mouth daily.   omeprazole 40 MG capsule Commonly known as:  PRILOSEC Take 40 mg by mouth daily.      Allergies  Allergen Reactions  . Gabapentin Itching  . Tylenol [Acetaminophen] Itching  . Aspirin Itching  . Buprenorphine Hcl Itching  . Codeine Itching and Other (See Comments)    Makes her feel like something is crawling under her skin. Can take, sometimes makes her itch  . Compazine Itching and Other (See  Comments)    "makes my skin crawl"  . Ioxaglate Itching  . Ivp Dye [Iodinated Diagnostic Agents] Itching and Other (See Comments)    Makes her itch really bad. Had to get 2-3 shots of Benadryl when she was in the hospital.  . Metrizamide Itching  . Naproxen Itching  . Norco [Hydrocodone-Acetaminophen] Itching  . Penicillin G Hives, Rash and Other (See Comments)    Has patient had a PCN reaction causing immediate rash, facial/tongue/throat swelling, SOB or lightheadedness with hypotension: Yes Has patient had a PCN reaction causing severe rash involving mucus membranes or skin necrosis: No Has patient had a PCN reaction that required hospitalization: No Has patient had a PCN reaction occurring within the last 10  years: No If all of the above answers are "NO", then may proceed with Cephalosporin use.   Marland Kitchen Penicillins Itching and Other (See Comments)    Has patient had a PCN reaction causing immediate rash, facial/tongue/throat swelling, SOB or lightheadedness with hypotension: Yes Has patient had a PCN reaction causing severe rash involving mucus membranes or skin necrosis: No Has patient had a PCN reaction that required hospitalization: No Has patient had a PCN reaction occurring within the last 10 years: No If all of the above answers are "NO", then may proceed with Cephalosporin use.   Marland Kitchen Prochlorperazine Maleate Other (See Comments)    Compazine makes her skin crawl - needs 2-3 shots of Benadryl to get relief.  . Reglan [Metoclopramide] Itching and Other (See Comments)    Makes her skin crawl  . Sulfur Hives, Rash and Other (See Comments)    Skin Rashes, Hives  . Tegretol [Carbamazepine] Itching and Other (See Comments)    Makes her feel like something is crawling under her skin.  . Toradol [Ketorolac Tromethamine] Itching  . Tramadol Itching  . Zofran [Ondansetron Hcl] Itching   Follow-up Information    [facility] Follow up.   Specialty:  Home Health Services Why:  HHPT Contact information: [address] [place] [address] [phone]        Fayrene Helper, NP. Schedule an appointment as soon as possible for a visit in 1 week(s).   Specialty:  Nurse Practitioner Contact information: [address] [place] [address] [phone]            The results of significant diagnostics from this hospitalization (including imaging, microbiology, ancillary and laboratory) are listed below for reference.    Significant Diagnostic Studies: Ct Abdomen Pelvis Wo Contrast  Result Date: 08/22/2018 CLINICAL DATA:  Nausea and vomiting. Abdominal pain. EXAM: CT ABDOMEN AND PELVIS WITHOUT CONTRAST TECHNIQUE: Multidetector CT imaging of the abdomen and  pelvis was performed following the standard protocol without IV contrast. COMPARISON:  Multiple exams, including 08/16/2018 CT and ultrasound FINDINGS: Lower chest: New airspace opacities in the left lower lobe suspicious for pneumonia. Airway thickening in the right lower lobe. Nasogastric tube enters the stomach. Hepatobiliary: Hepatic steatosis. Contracted gallbladder. Periportal edema. Pancreas: Unremarkable Spleen: Unremarkable Adrenals/Urinary Tract: Stable scarring of the right kidney. 3 mm calcification in the left kidney upper pole could be a parenchymal calcification or a renal calculus. Several similar punctate calcifications so seated with the right kidney are present, probably renal calculi although 1 or more could be parenchymal calcifications. There is no hydronephrosis or hydroureter. Otherwise empty urinary bladder containing a Foley catheter. Adrenal glands normal. Stomach/Bowel: Nasogastric tube enters the stomach. Scattered sigmoid colon diverticula. Normal appendix. Vascular/Lymphatic: Aortoiliac atherosclerotic vascular disease. Reproductive:  Uterus absent. Adnexa unremarkable. Other: Central mesenteric edema, increased from prior. Musculoskeletal: Long-term chronic endplate sclerosis and loss of disc height with mild endplate irregularity at L5-S1. IMPRESSION: 1. Airspace opacities in the left lower lobe suspicious for pneumonia. Airway thickening in the right lower lobe. 2. Hepatic steatosis. 3. Periportal edema, nonspecific but possibly from passive congestion. 4. Bilateral nonobstructive nephrolithiasis. 5. Aortoiliac atherosclerotic vascular disease. 6. Central mesenteric edema, increased from prior. 7. Long-term chronic endplate sclerosis and loss of disc height at L5-S1. Electronically Signed   By: Gaylyn Rong M.D.   On: 08/22/2018 00:52   Dg Chest 2 View  Result Date: 08/21/2018 CLINICAL DATA:  Altered mental status. Weakness and low blood pressure. EXAM: CHEST - 2 VIEW  COMPARISON:  Radiographs 08/16/2018 and 06/25/2018. Abdominal CT 08/16/2018. FINDINGS: The heart size is stable and normal for AP technique. There are lower lung volumes with vascular congestion and probable mild bibasilar atelectasis. There is no confluent airspace opacity, pneumothorax or significant pleural effusion. No acute osseous findings are evident. IMPRESSION: Lower lung volumes with vascular congestion and probable mild bibasilar atelectasis. Electronically Signed   By: Carey Bullocks M.D.   On: 08/21/2018 19:15   Ct Head Wo Contrast  Result Date: 08/22/2018 CLINICAL DATA:  Altered mental status.  History of stroke. EXAM: CT HEAD WITHOUT CONTRAST CT CERVICAL SPINE WITHOUT CONTRAST TECHNIQUE: Multidetector CT imaging of the head and cervical spine was performed following the standard protocol without intravenous contrast. Multiplanar CT image reconstructions of the cervical spine were also generated. COMPARISON:  CT HEAD July 22, 2018 FINDINGS: CT HEAD FINDINGS BRAIN: No intraparenchymal hemorrhage, mass effect nor midline shift. LEFT occipital lobe encephalomalacia with ex vacuo dilatation subjacent ventricle. No acute large vascular territory infarcts. No abnormal extra-axial fluid collections. Basal cisterns are patent. VASCULAR: Mild calcific atherosclerosis carotid siphon. SKULL/SOFT TISSUES: No skull fracture. No significant soft tissue swelling. ORBITS/SINUSES: The included ocular globes and orbital contents are normal.Moderate lobulated paranasal sinus mucosal thickening. Mastoid air cells are well aerated. OTHER: Life support lines in place. Subcutaneous gas within included face soft tissues. Multiple dental caries. CT CERVICAL SPINE FINDINGS ALIGNMENT: Straightened lordosis.  Vertebral bodies in alignment. SKULL BASE AND VERTEBRAE: Cervical vertebral bodies and posterior elements are intact. Moderate C5-6 and mild C6-7 disc height loss with endplate spurring compatible with degenerative  discs. T1 and T2 Schmorl's nodes. No destructive bony lesions. C1-2 articulation maintained. SOFT TISSUES AND SPINAL CANAL: Life support lines in place. Mild calcific atherosclerosis carotid bifurcations. RIGHT greater than LEFT anterior neck subcutaneous fat stranding, possibly postprocedural. Small volume anterior neck subcutaneous gas. DISC LEVELS: No significant osseous canal stenosis or neural foraminal narrowing. UPPER CHEST: Lung apices are clear. OTHER: None. IMPRESSION: CT HEAD: 1. No acute intracranial process. 2. Stable examination including old LEFT occipital lobe infarct. CT CERVICAL SPINE: 1. No fracture or malalignment. 2. Life support lines in place. Electronically Signed   By: Awilda Metro M.D.   On: 08/22/2018 00:51   Ct Cervical Spine Wo Contrast  Result Date: 08/22/2018 CLINICAL DATA:  Altered mental status.  History of stroke. EXAM: CT HEAD WITHOUT CONTRAST CT CERVICAL SPINE WITHOUT CONTRAST TECHNIQUE: Multidetector CT imaging of the head and cervical spine was performed following the standard protocol without intravenous contrast. Multiplanar CT image reconstructions of the cervical spine were also generated. COMPARISON:  CT HEAD July 22, 2018 FINDINGS: CT HEAD FINDINGS BRAIN: No intraparenchymal hemorrhage, mass effect nor midline shift. LEFT occipital lobe encephalomalacia with ex vacuo dilatation subjacent ventricle.  No acute large vascular territory infarcts. No abnormal extra-axial fluid collections. Basal cisterns are patent. VASCULAR: Mild calcific atherosclerosis carotid siphon. SKULL/SOFT TISSUES: No skull fracture. No significant soft tissue swelling. ORBITS/SINUSES: The included ocular globes and orbital contents are normal.Moderate lobulated paranasal sinus mucosal thickening. Mastoid air cells are well aerated. OTHER: Life support lines in place. Subcutaneous gas within included face soft tissues. Multiple dental caries. CT CERVICAL SPINE FINDINGS ALIGNMENT:  Straightened lordosis.  Vertebral bodies in alignment. SKULL BASE AND VERTEBRAE: Cervical vertebral bodies and posterior elements are intact. Moderate C5-6 and mild C6-7 disc height loss with endplate spurring compatible with degenerative discs. T1 and T2 Schmorl's nodes. No destructive bony lesions. C1-2 articulation maintained. SOFT TISSUES AND SPINAL CANAL: Life support lines in place. Mild calcific atherosclerosis carotid bifurcations. RIGHT greater than LEFT anterior neck subcutaneous fat stranding, possibly postprocedural. Small volume anterior neck subcutaneous gas. DISC LEVELS: No significant osseous canal stenosis or neural foraminal narrowing. UPPER CHEST: Lung apices are clear. OTHER: None. IMPRESSION: CT HEAD: 1. No acute intracranial process. 2. Stable examination including old LEFT occipital lobe infarct. CT CERVICAL SPINE: 1. No fracture or malalignment. 2. Life support lines in place. Electronically Signed   By: Awilda Metro M.D.   On: 08/22/2018 00:51   Dg Chest Port 1 View  Result Date: 08/22/2018 CLINICAL DATA:  Respiratory failure. EXAM: PORTABLE CHEST 1 VIEW COMPARISON:  Radiograph of August 21, 2018. FINDINGS: Stable cardiomediastinal silhouette. Endotracheal tube has been withdrawn and is in grossly good position with distal tip 3 cm above the carina. Nasogastric tube is in good position. No pneumothorax is noted. Mild perihilar and bibasilar opacities are noted concerning for edema or possibly atelectasis. Minimal pleural effusions may be present. Bony thorax is unremarkable. IMPRESSION: Endotracheal and nasogastric tubes are in grossly good position. Mild bilateral perihilar and bibasilar opacities are noted suggesting atelectasis or possibly edema. Minimal pleural effusions. Electronically Signed   By: Lupita Raider, M.D.   On: 08/22/2018 08:07   Dg Chest Port 1 View  Result Date: 08/21/2018 CLINICAL DATA:  Intubation. EXAM: PORTABLE CHEST 1 VIEW COMPARISON:  Radiograph  earlier this day FINDINGS: Tip of the endotracheal tube in the right mainstem bronchus. Enteric tube in place with tip below the diaphragm, side-port not well visualized. Unchanged heart size and mediastinal contours. Increasing retrocardiac opacity with possible left pleural effusion. Vascular congestion again seen. No pneumothorax. No acute osseous abnormalities. IMPRESSION: 1. Right mainstem intubation. Recommend retraction of 3.5 cm for optimal placement. Tip of the enteric tube below the diaphragm. 2. Increasing retrocardiac opacity with possible left pleural effusion, aspiration is considered. Vascular congestion again seen. These results were called by telephone at the time of interpretation on 08/21/2018 at 10:30 pm to Dr. Raeford Razor , who verbally acknowledged these results. Electronically Signed   By: Narda Rutherford M.D.   On: 08/21/2018 22:30    Microbiology: Recent Results (from the past 240 hour(s))  Culture, blood (Routine x 2)     Status: None (Preliminary result)   Collection Time: 08/21/18  6:45 PM  Result Value Ref Range Status   Specimen Description BLOOD SITE NOT SPECIFIED  Final   Special Requests   Final    BOTTLES DRAWN AEROBIC AND ANAEROBIC Blood Culture results may not be optimal due to an inadequate volume of blood received in culture bottles   Culture   Final    NO GROWTH 4 DAYS Performed at [facility], [address], [place], [place]  [address]    Report Status PENDING  Incomplete  Culture, blood (Routine x 2)     Status: None (Preliminary result)   Collection Time: 08/21/18  7:15 PM  Result Value Ref Range Status   Specimen Description BLOOD RIGHT ANTECUBITAL  Final   Special Requests   Final    BOTTLES DRAWN AEROBIC AND ANAEROBIC Blood Culture adequate volume   Culture   Final    NO GROWTH 4 DAYS Performed at [facility], [address], [place], [place] [address]    Report Status PENDING  Incomplete  Culture, Urine     Status: Abnormal    Collection Time: 08/21/18  9:22 PM  Result Value Ref Range Status   Specimen Description URINE, RANDOM  Final   Special Requests   Final    NONE Performed at [facility], [address] [place], [place] [address]    Culture 20,000 COLONIES/mL YEAST (A)  Final   Report Status 08/23/2018 FINAL  Final  MRSA PCR Screening     Status: None   Collection Time: 08/22/18 12:39 AM  Result Value Ref Range Status   MRSA by PCR NEGATIVE NEGATIVE Final    Comment:        The GeneXpert MRSA Assay (FDA approved for NASAL specimens only), is one component of a comprehensive MRSA colonization surveillance program. It is not intended to diagnose MRSA infection nor to guide or monitor treatment for MRSA infections. Performed at [facility], [address] [place], [place] [address]      Labs: Basic Metabolic Panel: Recent Labs  Lab 08/21/18 1845 08/21/18 2138 08/22/18 0720 08/24/18 0237 08/25/18 0206  NA 142  --  143 142 138  K 6.0* 4.8 4.0 3.5 3.5  CL 109  --  111 106 102  CO2 23  --  20* 25 27  GLUCOSE 119*  --  156* 136* 137*  BUN 16  --  11 7 6   CREATININE 1.09*  --  0.78 0.61 0.58  CALCIUM 7.1*  --  7.7* 8.5* 8.7*  MG  --   --  1.5*  --   --   PHOS  --   --  2.3*  --   --    Liver Function Tests: Recent Labs  Lab 08/21/18 1845 08/22/18 0720  AST 27  --   ALT 20  --   ALKPHOS 112  --   BILITOT 0.7  --   PROT 6.6  --   ALBUMIN 3.6 2.5*   No results for input(s): LIPASE, AMYLASE in the last 168 hours. No results for input(s): AMMONIA in the last 168 hours. CBC: Recent Labs  Lab 08/21/18 1845 08/22/18 0720 08/24/18 0237 08/25/18 0206  WBC 8.6 10.3 8.6 7.2  NEUTROABS 4.5  --   --   --   HGB 13.0 11.0* 11.2* 11.4*  HCT 42.5 36.0 35.4* 36.7  MCV 97.9 98.4 95.2 93.6  PLT 135* 141* 133* 153   Cardiac Enzymes: Recent Labs  Lab 08/21/18 2331 08/22/18 0720  CKTOTAL  --  137  TROPONINI <0.03 <0.03   BNP: BNP (last 3 results) Recent Labs     08/21/18 2138  BNP 45.9    ProBNP (last 3 results) No results for input(s): PROBNP in the last 8760 hours.  CBG: Recent Labs  Lab 08/24/18 1116 08/24/18 1641 08/24/18 2150 08/25/18 0725 08/25/18 1158  GLUCAP 158* 134* 135* 143* 149*       Signed:  Zannie Cove MD.  [facility] 08/25/2018, 1:18 PM

## 2018-08-25 NOTE — Progress Notes (Signed)
Pt seen by MD, orders written for d/c.  Went over discharge instructions with pt and answered all questions.  Removed IV's no complications.  Escorted for discharge via wheelchair with all belongings.  Will follow up outpatient with MD.

## 2018-08-25 NOTE — Evaluation (Signed)
Physical Therapy Evaluation Patient Details Name: Stephanie GuerinDianna B [name] MRN: [id number] DOB: [birth year] Today's Date: 08/25/2018   History of Present Illness  Patient is a 52 y/o female presenting to the ED on 08/21/18 due to several falls, slurred speech.  Intubated on 08/21/18, extubated on 08/24/18. Admitted with Toxic metabolic encephalopathy. PMH significant for COPD, sleep apnea, diabetes, obesity, hypertension, chronic back pain.    Clinical Impression  Stephanie [name] admitted with the above listed diagnosis. Patient reports IND with all mobility and ADLs prior to admission. Patient today performing transfers and mobility at SUP/min guard level for safety - no physical assist required to complete task or for steadying. Will recommend OPPT at discharge to progress high level balance. PT to continue to follow to maximize safe and independent functional mobility prior to discharge.     Follow Up Recommendations Outpatient PT;Supervision - Intermittent(may benefit from OPPT for balance )    Equipment Recommendations  None recommended by PT    Recommendations for Other Services       Precautions / Restrictions Precautions Precautions: Fall Precaution Comments: watch O2 Restrictions Weight Bearing Restrictions: No      Mobility  Bed Mobility Overal bed mobility: Independent                Transfers Overall transfer level: Needs assistance Equipment used: None Transfers: Sit to/from Stand Sit to Stand: Supervision         General transfer comment: for safety and immediate standing balance, slightly impulsive  Ambulation/Gait Ambulation/Gait assistance: Min guard Gait Distance (Feet): 100 Feet Assistive device: None Gait Pattern/deviations: Step-through pattern;Decreased stride length;Drifts right/left Gait velocity: decreased   General Gait Details: mild unsteadiness throughout, no overt instability requiring physical assist  Stairs            Wheelchair  Mobility    Modified Rankin (Stroke Patients Only)       Balance Overall balance assessment: Needs assistance Sitting-balance support: No upper extremity supported;Feet supported Sitting balance-Leahy Scale: Good     Standing balance support: No upper extremity supported;During functional activity Standing balance-Leahy Scale: Fair                               Pertinent Vitals/Pain Pain Assessment: No/denies pain    Home Living Family/patient expects to be discharged to:: Private residence Living Arrangements: Spouse/significant other Available Help at Discharge: Family;Available PRN/intermittently Type of Home: House Home Access: Stairs to enter Entrance Stairs-Rails: Right Entrance Stairs-Number of Steps: 4 Home Layout: One level Home Equipment: None      Prior Function Level of Independence: Independent               Hand Dominance        Extremity/Trunk Assessment   Upper Extremity Assessment Upper Extremity Assessment: Defer to OT evaluation    Lower Extremity Assessment Lower Extremity Assessment: Generalized weakness    Cervical / Trunk Assessment Cervical / Trunk Assessment: Normal  Communication   Communication: No difficulties  Cognition Arousal/Alertness: Awake/alert Behavior During Therapy: WFL for tasks assessed/performed Overall Cognitive Status: Within Functional Limits for tasks assessed                                        General Comments General comments (skin integrity, edema, etc.): O2 on RA prior to mobility 93%, HR 89; SpO2 with ambulation 88%  with HR 114 - quickly recovers to 92% and HR of 92 after seated rest break    Exercises     Assessment/Plan    PT Assessment Patient needs continued PT services  PT Problem List Decreased strength;Decreased activity tolerance;Decreased balance;Decreased mobility;Decreased safety awareness       PT Treatment Interventions DME instruction;Gait  training;Stair training;Functional mobility training;Therapeutic activities;Therapeutic exercise;Balance training;Patient/family education    PT Goals (Current goals can be found in the Care Plan section)  Acute Rehab PT Goals Patient Stated Goal: return home soon PT Goal Formulation: With patient Time For Goal Achievement: 09/08/18 Potential to Achieve Goals: Good    Frequency Min 3X/week   Barriers to discharge        Co-evaluation               AM-PAC PT "6 Clicks" Mobility  Outcome Measure Help needed turning from your back to your side while in a flat bed without using bedrails?: None Help needed moving from lying on your back to sitting on the side of a flat bed without using bedrails?: None Help needed moving to and from a bed to a chair (including a wheelchair)?: A Little Help needed standing up from a chair using your arms (e.g., wheelchair or bedside chair)?: A Little Help needed to walk in hospital room?: A Little Help needed climbing 3-5 steps with a railing? : A Little 6 Click Score: 20    End of Session Equipment Utilized During Treatment: Gait belt Activity Tolerance: Patient tolerated treatment well Patient left: in chair;with call bell/phone within reach;with chair alarm set Nurse Communication: Mobility status PT Visit Diagnosis: Unsteadiness on feet (R26.81);Other abnormalities of gait and mobility (R26.89);Muscle weakness (generalized) (M62.81)    Time: 4098-1191 PT Time Calculation (min) (ACUTE ONLY): 18 min   Charges:   PT Evaluation $PT Eval Moderate Complexity: 1 Mod         Kipp Laurence, PT, DPT Supplemental Physical Therapist 08/25/18 9:03 AM Pager: [phone] Office: [phone]

## 2018-08-25 NOTE — Care Management Note (Addendum)
Case Management Note  Patient Details  Name: Elayne GuerinDianna B Dement MRN: [id number] Date of Birth: 1965-12-09  Subjective/Objective:    From home , resp failure, for dc today or tomorrow, patient states she does not drive so she will need HH services to come to her home.  NCM offered choice, patient chose [facility] , they can not take referral,  NCM tried [facility], they can not take referral,  NCM tried [facility], awaiting to her back.                 Action/Plan: NCM will follow for transition of care needs.   Expected Discharge Date:                  Expected Discharge Plan:  Home w Home Health Services  In-House Referral:     Discharge planning Services  CM Consult  Post Acute Care Choice:  Home Health Choice offered to:  Patient  DME Arranged:    DME Agency:     HH Arranged:  PT HH Agency:   [facility]  Status of Service:  In process, will continue to follow  If discussed at Long Length of Stay Meetings, dates discussed:    Additional Comments:  Leone Havenaylor, Hikaru Delorenzo Clinton, RN 08/25/2018, 12:52 PM

## 2018-08-26 LAB — CULTURE, BLOOD (ROUTINE X 2)
Culture: NO GROWTH
Culture: NO GROWTH
Special Requests: ADEQUATE

## 2018-08-29 IMAGING — CT CT RENAL STONE PROTOCOL
2 of 4 series · 16 of 46 positions shown, 18 images · non-contrast
Comparison: Multiple exams, including abdominal ultrasound from
03/11/2016 and CT abdomen from 07/06/2016

CLINICAL DATA: Right-sided abdominal pain for 2 weeks. Nausea and
vomiting. Dysuria. History of renal calculi. Back pain. Diabetes.

EXAM:
CT ABDOMEN AND PELVIS WITHOUT CONTRAST
TECHNIQUE: Multidetector CT imaging of the abdomen and pelvis was performed
following the standard protocol without IV contrast.

[Series 2: axial st · axial · 0.74mm/px · z∈[-238,+182]mm · 13 of 92 slices shown, 15 images]
[im 4/92  soft-tissue]
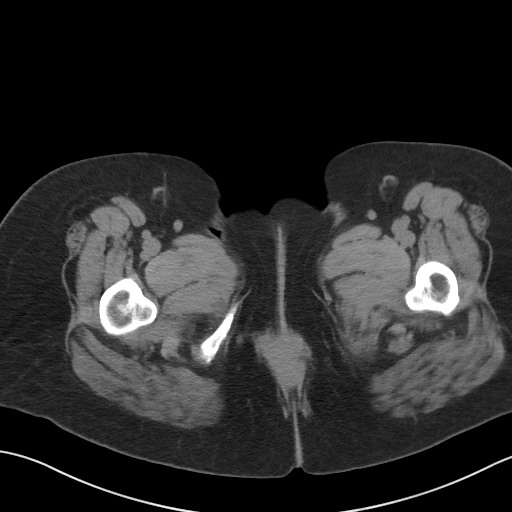
[im 4/92  bone]
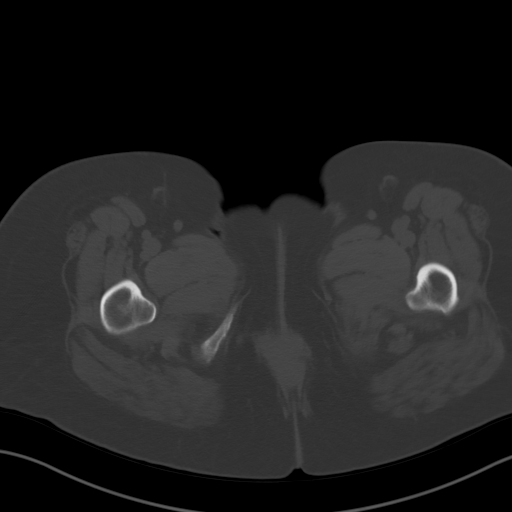
[im 11/92  soft-tissue]
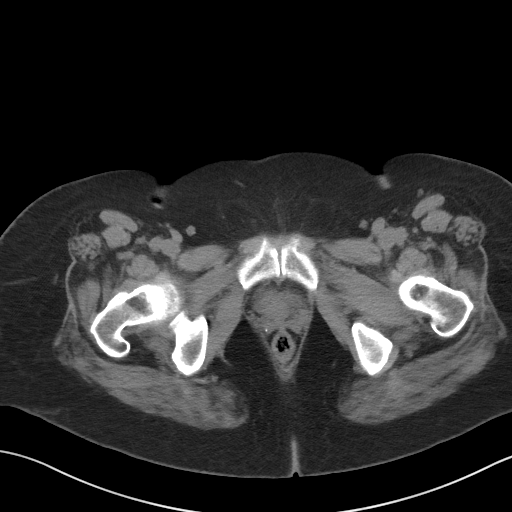
[im 19/92  soft-tissue]
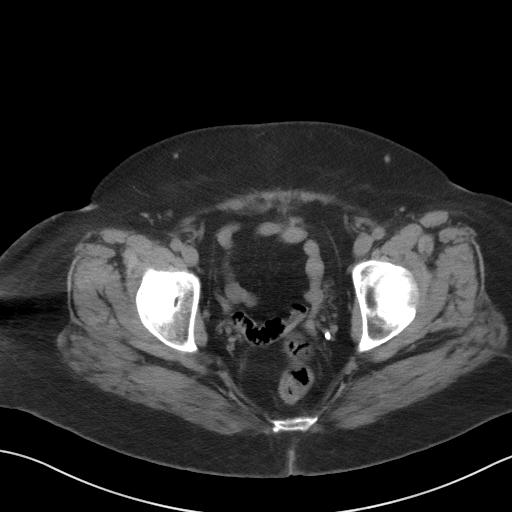
[im 26/92  soft-tissue]
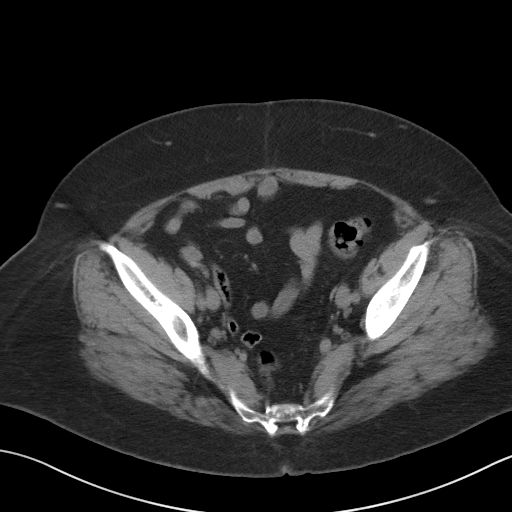
[im 33/92  soft-tissue]
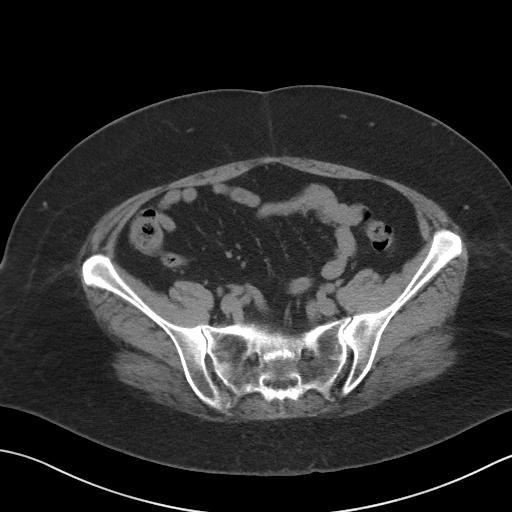
[im 41/92  soft-tissue]
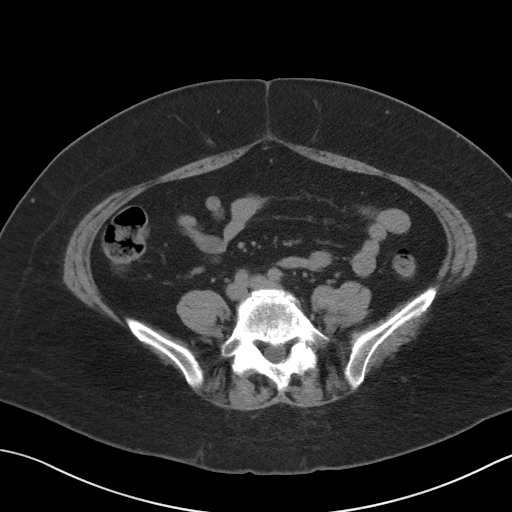
[im 48/92  soft-tissue]
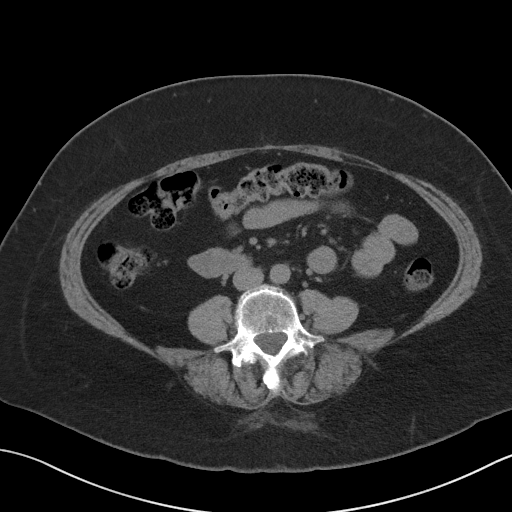
[im 51/92  soft-tissue]
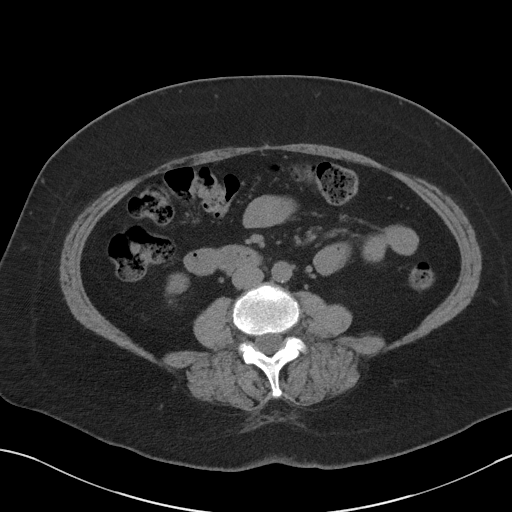
[im 59/92  soft-tissue]
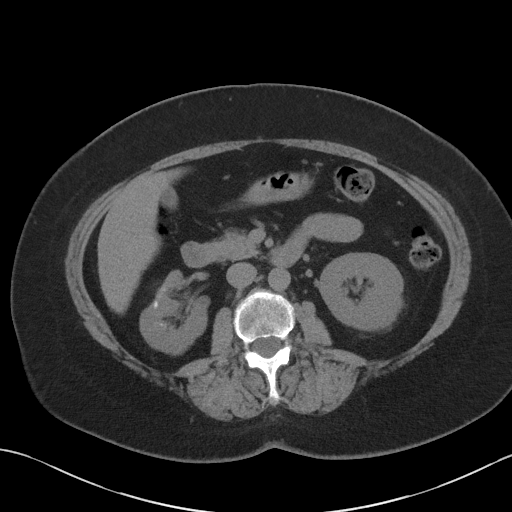
[im 59/92  bone]
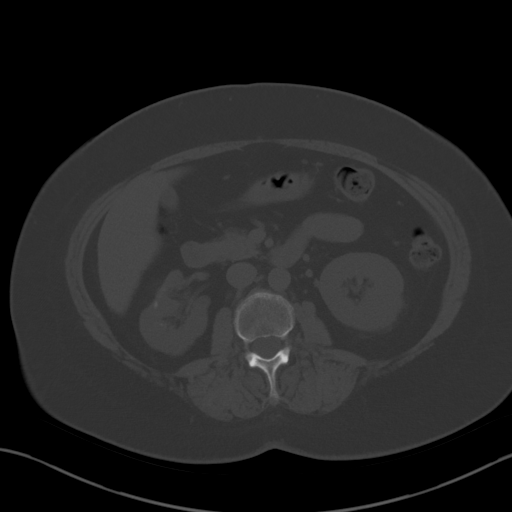
[im 66/92  soft-tissue]
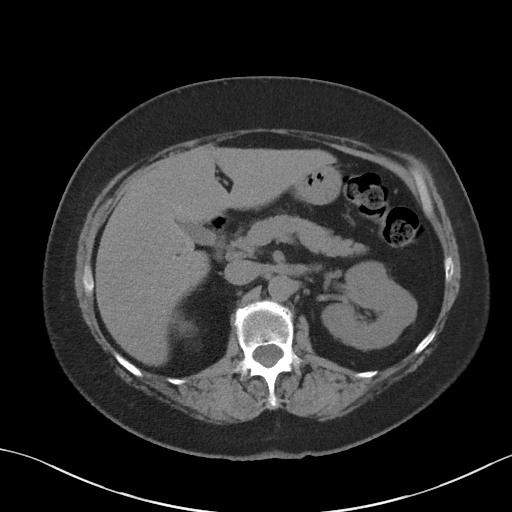
[im 73/92  soft-tissue]
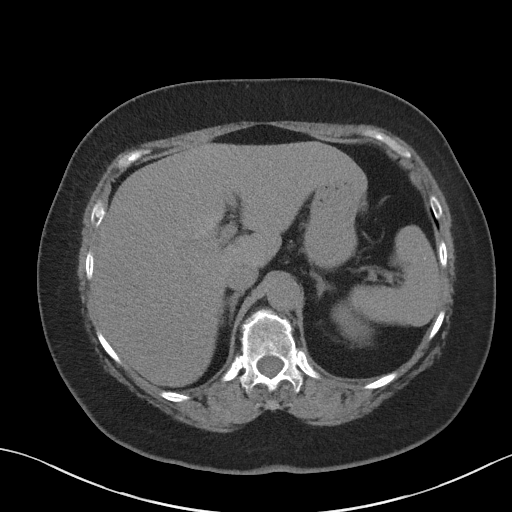
[im 81/92  soft-tissue]
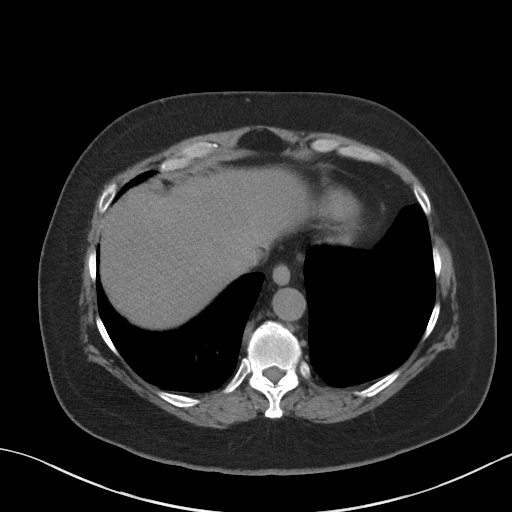
[im 88/92  soft-tissue]
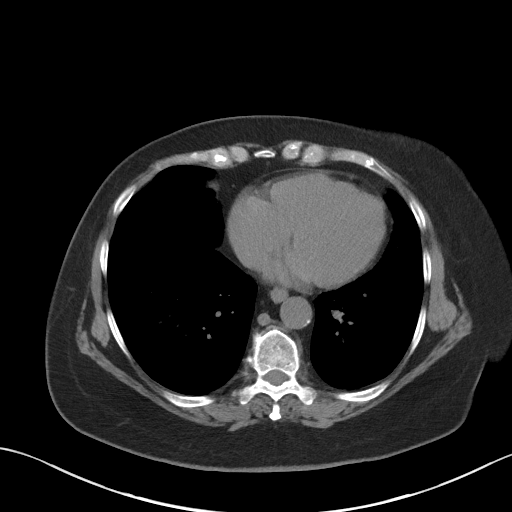

[Series 5: coronal · coronal · 0.77mm/px · 3 of 135 slices shown]
[im 45/135  soft-tissue]
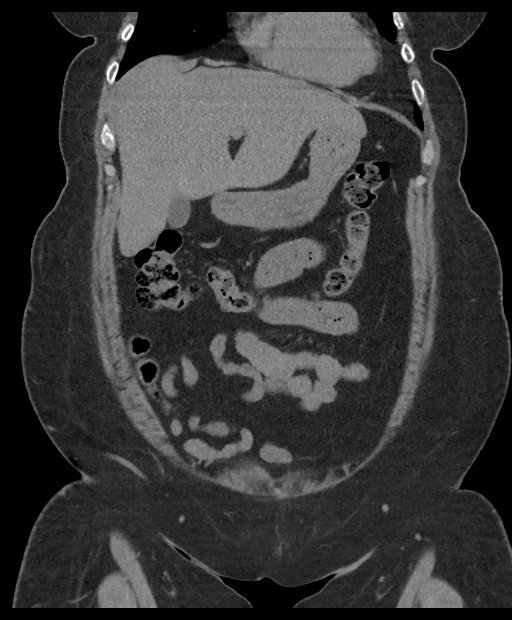
[im 60/135  soft-tissue]
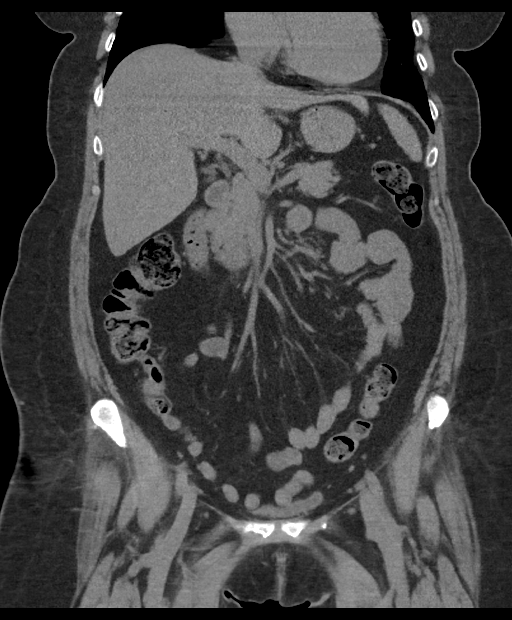
[im 75/135  soft-tissue]
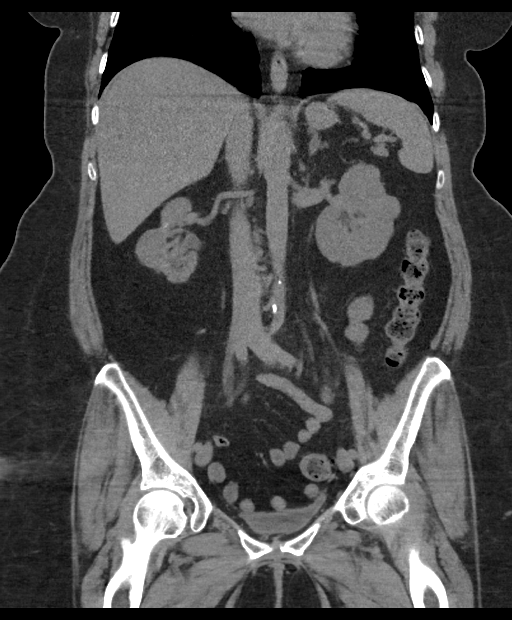

[16 of 46 positions shown; findings below may reference images not displayed]

FINDINGS: Lower chest: 4 mm average size left lower lobe subpleural nodule on
image [DATE], not changed from the earliest available comparison of
04/23/2007, benign.

Hepatobiliary: Unremarkable

Pancreas: Unremarkable

Spleen: Unremarkable

Adrenals/Urinary Tract: There about 9 calcifications in the right
kidney, most which are thought to be small renal calculi. It has a
fairly similar appearance to 07/07/15. One of the calcifications is
fairly peripheral along a region of scarring in the right mid upper
kidney, and is the largest of the calcifications at 5 mm; this could
be a parenchymal calcification or a stone within a calyceal
diverticulum. Primarily the scarring, the right kidney has a lobular
morphology, more so than the left which appears to have a thicker
cortex due to the lack of scarring. The scarring is slightly worse
in the right kidney than on 04/23/2007, but otherwise I do not see
that any of the lobular areas heavy different density or
significantly different contour in such a way as to suggest an
underlying mass.

No ureteral or bladder calculus identified. There is a 1.0 by 1.4 cm
left adrenal adenoma, internal density 7 Hounsfield units.

Stomach/Bowel: Unremarkable

Vascular/Lymphatic: Mild abdominal aortic atherosclerotic
calcification. No pathologic adenopathy identified.

Reproductive: Uterus absent.  Adnexa unremarkable.

Other: No supplemental non-categorized findings.

Musculoskeletal: Endplate sclerosis, loss of disc height, and vacuum
disc phenomenon at L5-S1.
IMPRESSION: 1. Multiple nonobstructive right renal calculi. Several possible
parenchymal calcifications on the right. These are all very similar
to the prior exam. There is scarring of the right kidney but no
hydronephrosis or ureteral calculus.
2. Small left adrenal adenoma, benign.
3. The appendix appears normal.
4. Mild atherosclerosis.

## 2018-10-02 IMAGING — CT CT ABD-PELV W/O CM
2 of 4 series · 15 of 46 positions shown, 17 images · non-contrast
Comparison: 08/05/2016 and 12/14/2013 CT

CLINICAL DATA: Nausea, vomiting and abdominal pain since this
morning. History of diverticulitis and irritable bowel syndrome.
History of hysterectomy.

EXAM:
CT ABDOMEN AND PELVIS WITHOUT CONTRAST
TECHNIQUE: Multidetector CT imaging of the abdomen and pelvis was performed
following the standard protocol without IV contrast.

[Series 2: routine abd/pel wo · axial · 0.91mm/px · z∈[-462,-48]mm · 12 of 95 slices shown, 14 images]
[im 8/95  soft-tissue]
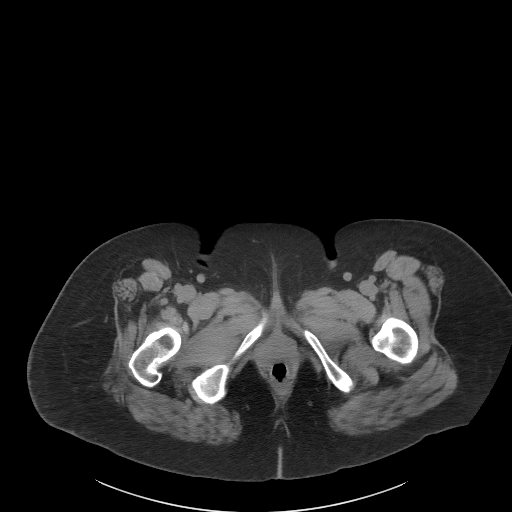
[im 8/95  bone]
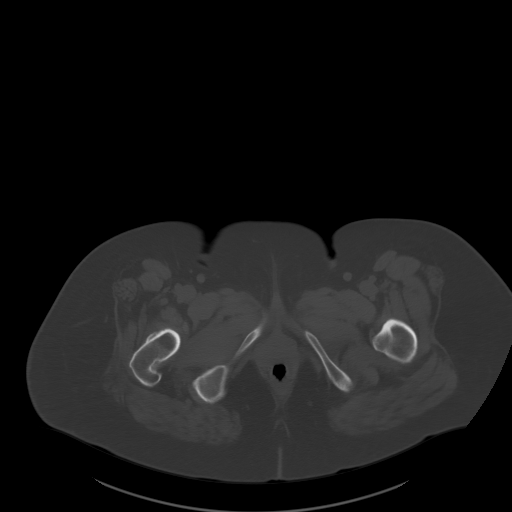
[im 16/95  soft-tissue]
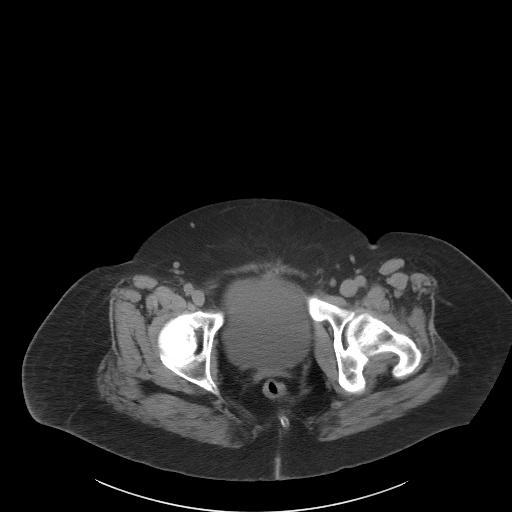
[im 23/95  soft-tissue]
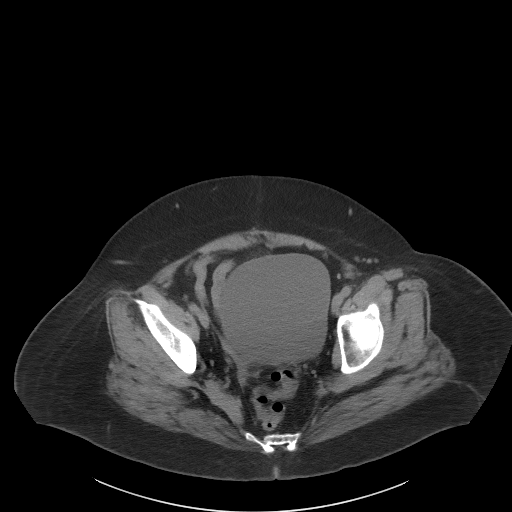
[im 31/95  soft-tissue]
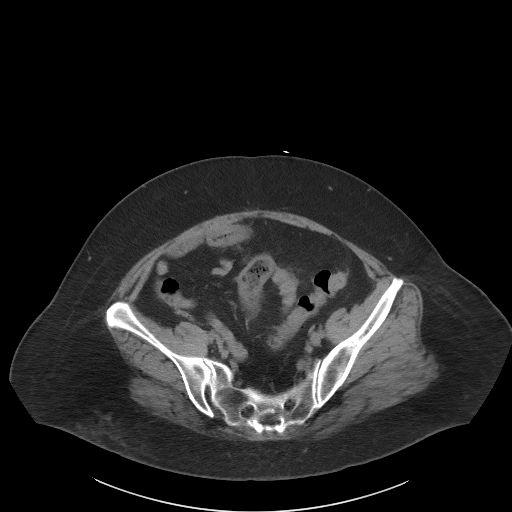
[im 38/95  soft-tissue]
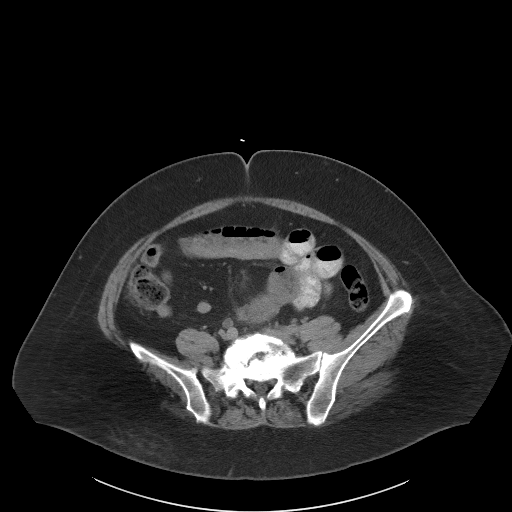
[im 46/95  soft-tissue]
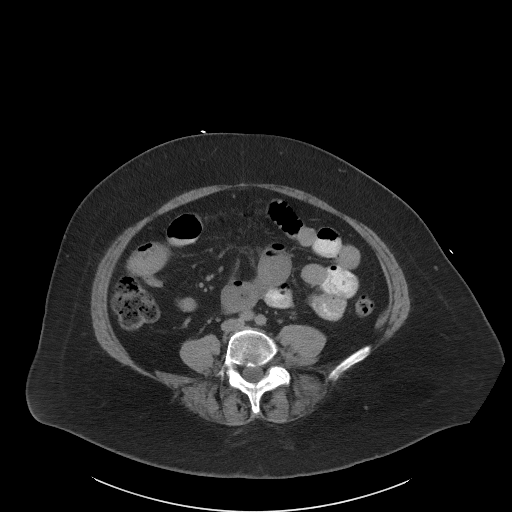
[im 53/95  soft-tissue]
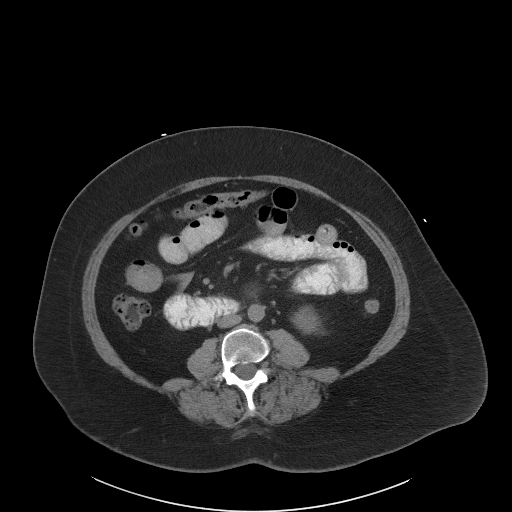
[im 61/95  soft-tissue]
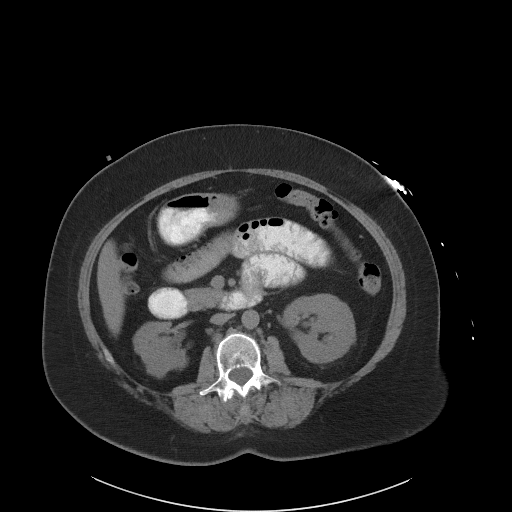
[im 68/95  soft-tissue]
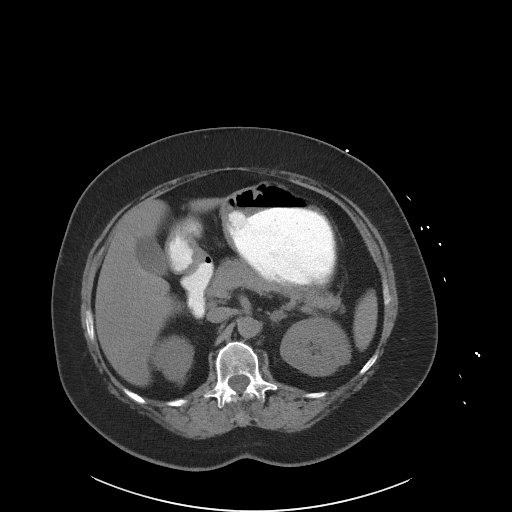
[im 68/95  bone]
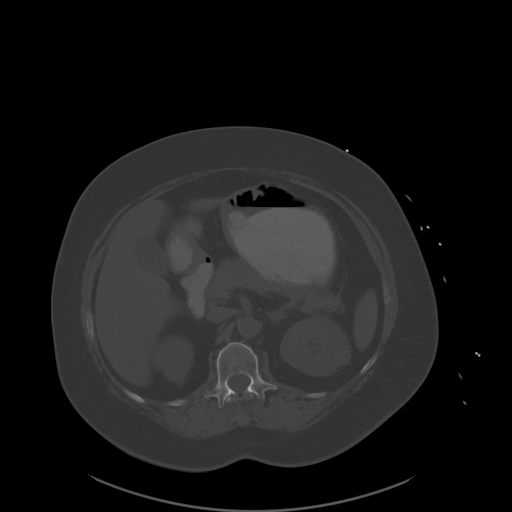
[im 76/95  soft-tissue]
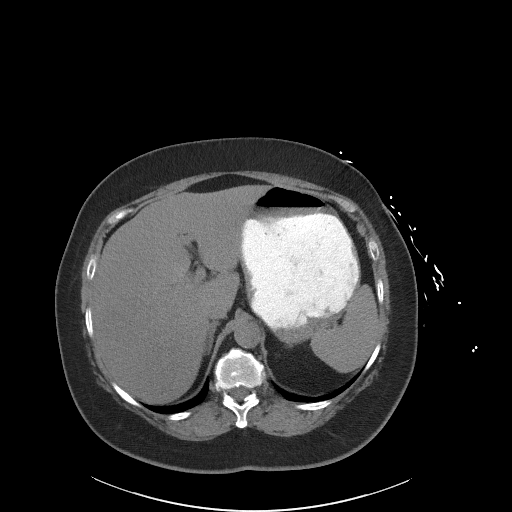
[im 83/95  soft-tissue]
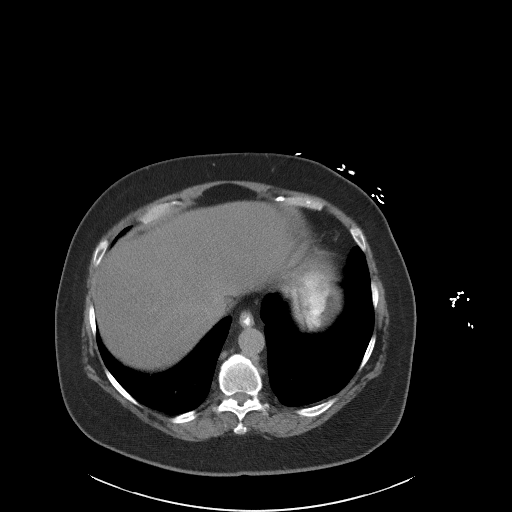
[im 91/95  soft-tissue]
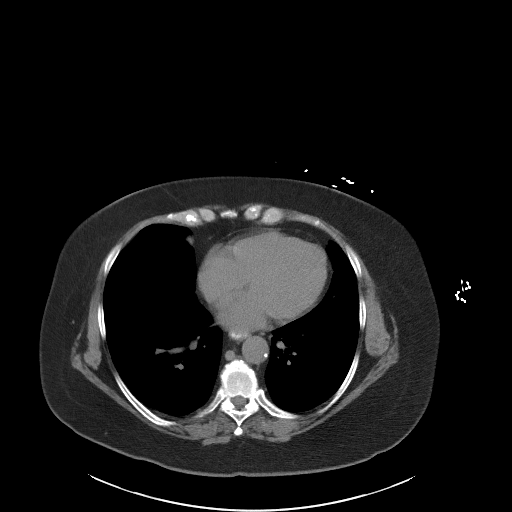

[Series 5: coronal st · coronal · 0.77mm/px · 3 of 104 slices shown]
[im 35/104  soft-tissue]
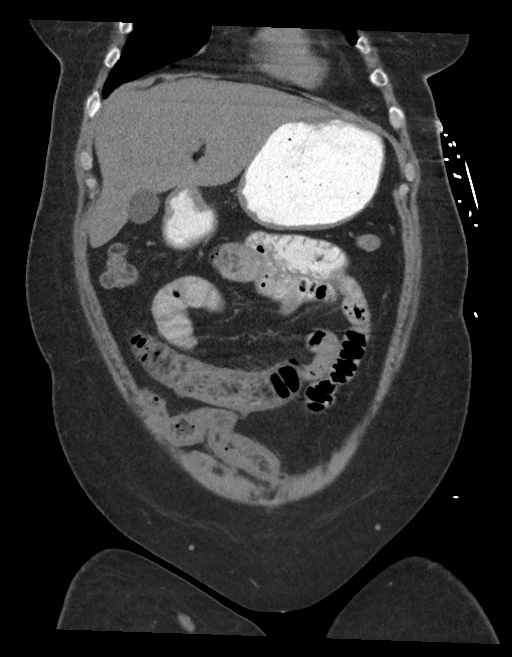
[im 46/104  soft-tissue]
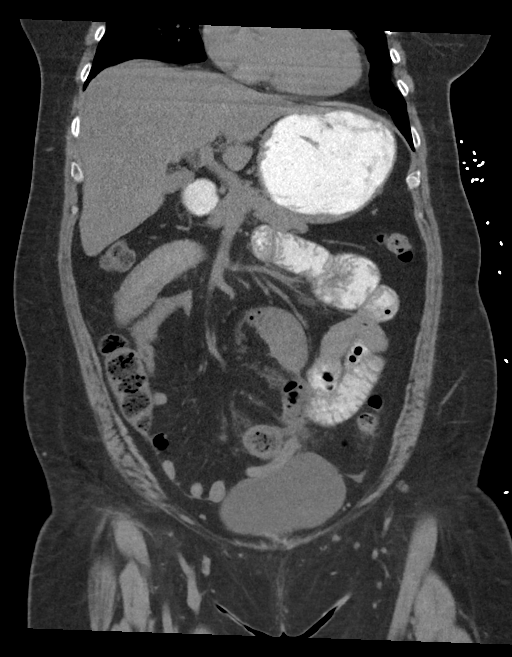
[im 58/104  soft-tissue]
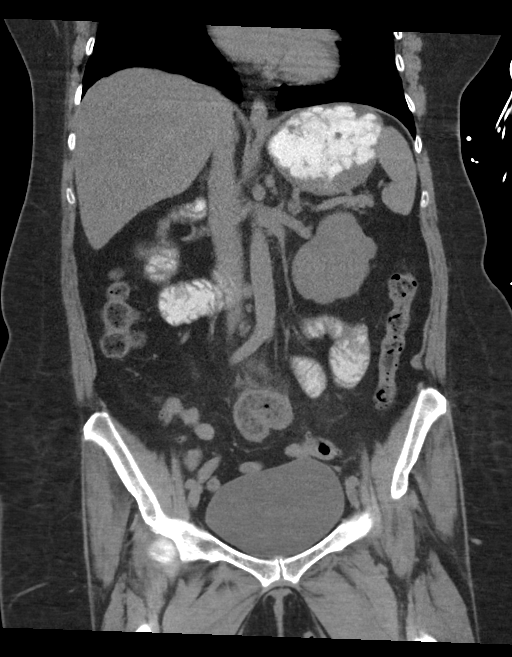

[15 of 46 positions shown; findings below may reference images not displayed]

FINDINGS: Lower chest: Respiratory motion limits assessment of the sub solid
right middle lobe nodular density seen previously. No pneumonic
consolidation, effusion or pulmonary edema. Stable 3 mm subpleural
nodular density in the left lower lobe. The visualized cardiac
chambers are within normal limits. Small amount of retained oral
contrast in the distal esophagus may be due to delayed esophageal
emptying versus reflux.

Hepatobiliary: No space-occupying mass of the liver. There appears
to be some mild biliary sludge along the dependent aspect of the
gallbladder which is not distended in appearance.

Pancreas: Normal

Spleen: Normal

Adrenals/Urinary Tract: The kidneys are fluoroscopic, mildly
atrophic on the right as before. Mild compensatory left renal
hypertrophy. Multifocal right renal cortical scarring and
parenchymal calcifications. No obstructive uropathy. Urinary bladder
is distended without mass or calculus. Stable 11 mm left adrenal
nodule.

Stomach/Bowel: Normal bowel rotation. Proximal to mid small bowel
dilatation likely due to adhesions in the right lower quadrant, one
such adhesion seen on series 2, image 45. This may be causing early
SBO or partial SBO. Fecalized material noted in the affected small
bowel loops in the right lower quadrant and upper pelvis. Large
bowel contains stool and gas without dilatation nor obstruction. No
acute inflammation. Mild sigmoid diverticulosis. Normal appendix.

Vascular/Lymphatic: Aortoiliac vessels are normal in course and
caliber with trace calcific atherosclerosis. No lymphadenopathy by
CT size criteria.

Reproductive: Hysterectomy.

Other: Trace free fluid in the pelvis.  No free air.

Musculoskeletal: L5-S1 degenerative disc disease with moderate to
severe L5-S1 neural foraminal encroachment. No acute osseous
abnormality.
IMPRESSION: Dilated proximal to mid small bowel loops consistent with early or
partial SBO and likely related to adhesions.

Atrophic, scarred right kidney with parenchymal calcifications. No
obstructive uropathy.

Stable 11 mm left adrenal nodule likely an adenoma.

Minimal biliary sludge noted in the gallbladder.

Possible reflux of oral contrast into the distal esophagus.

Stable 3 mm left lower lobe pulmonary nodule dating back to 6142
consistent with a benign finding.

## 2018-10-02 IMAGING — CT CT HEAD W/O CM
3 series · 15 of 47 positions shown, 18 images · non-contrast
Comparison: Most recent head CT 04/29/2016

CLINICAL DATA: Headache for 4 days.  History of hemorrhagic CVA.

EXAM:
CT HEAD WITHOUT CONTRAST
TECHNIQUE: Contiguous axial images were obtained from the base of the skull
through the vertex without intravenous contrast.

[Series 2: head wo · axial · 0.47mm/px · z∈[-118,+7]mm · 9 of 30 slices shown, 12 images]
[im 3/30  brain]
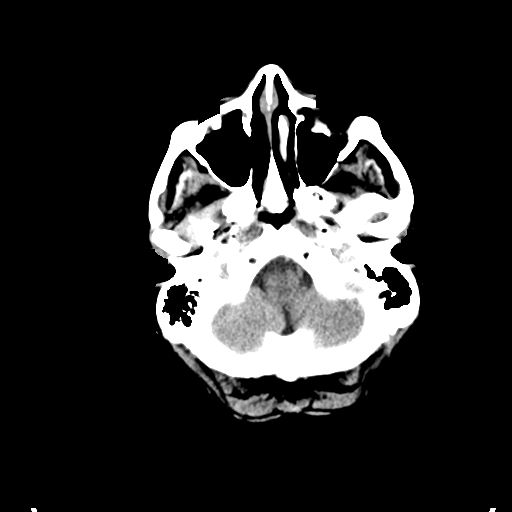
[im 3/30  bone]
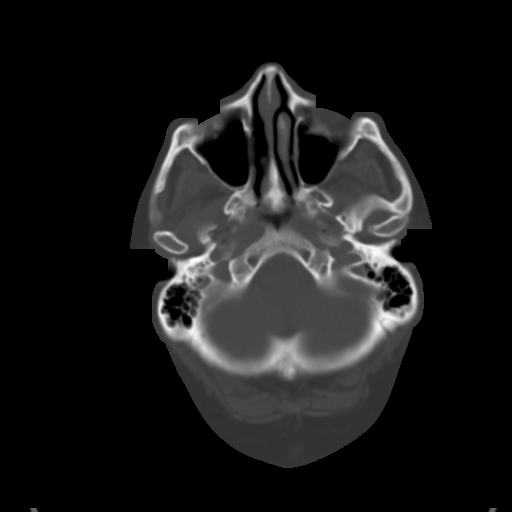
[im 6/30  brain]
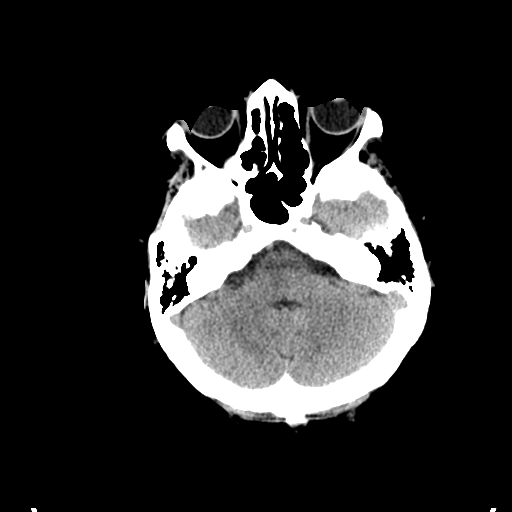
[im 9/30  brain]
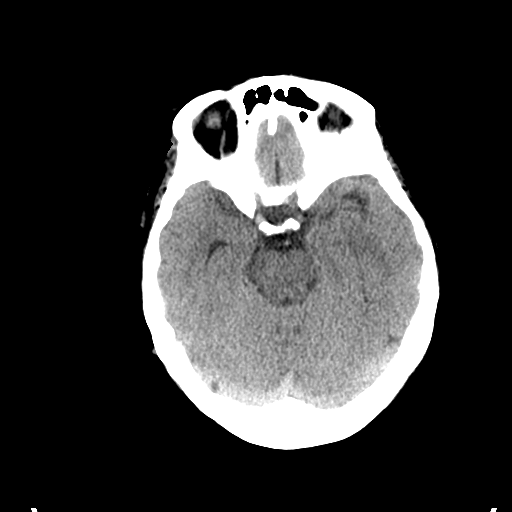
[im 12/30  brain]
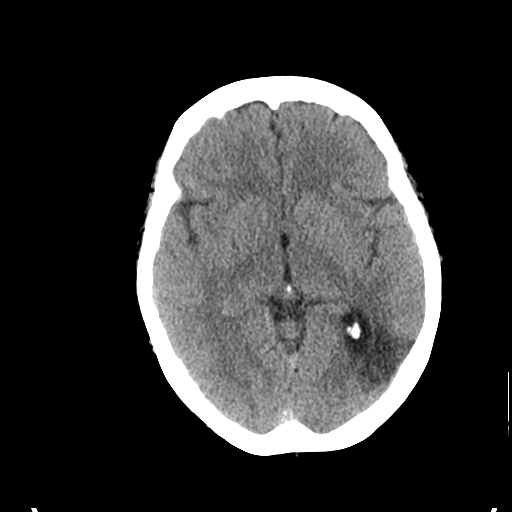
[im 16/30  brain]
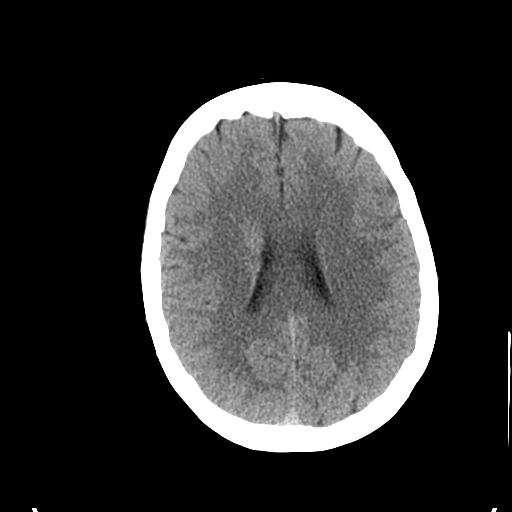
[im 16/30  bone]
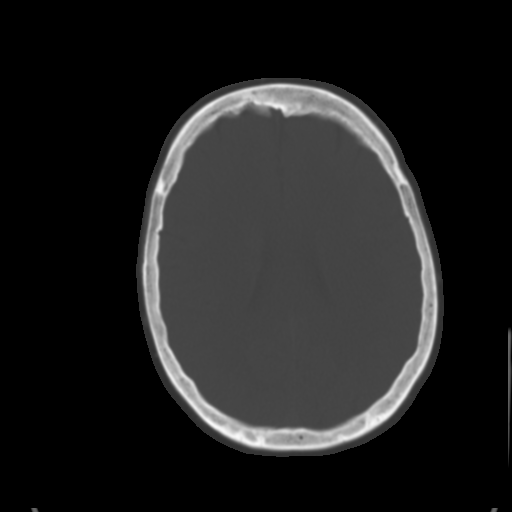
[im 19/30  brain]
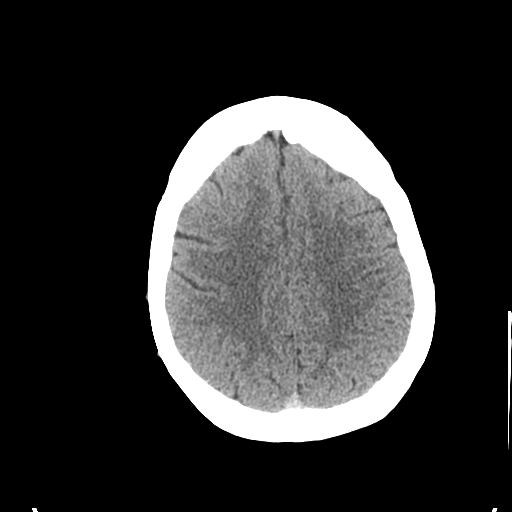
[im 22/30  brain]
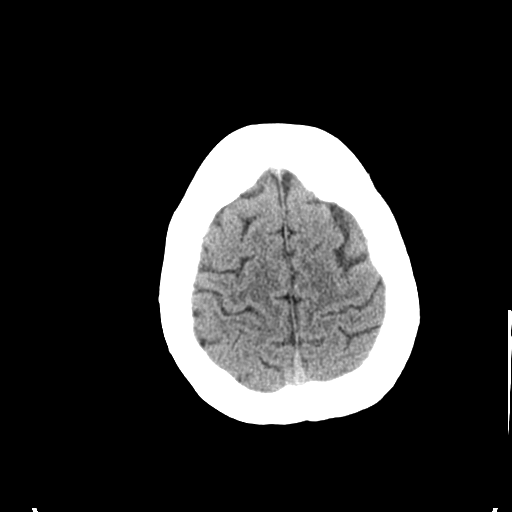
[im 25/30  brain]
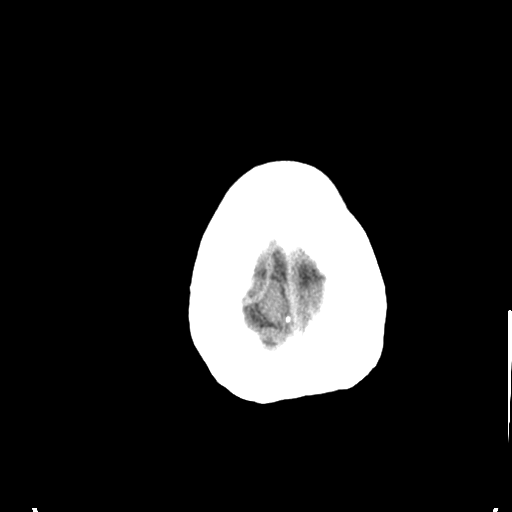
[im 28/30  brain]
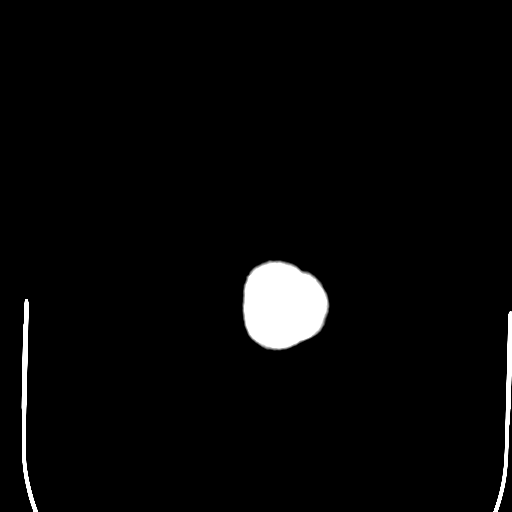
[im 28/30  bone]
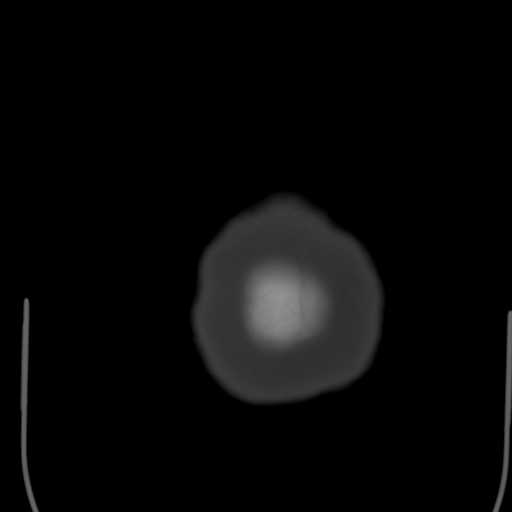

[Series 4: coronal soft tissue · coronal · 0.27mm/px · 3 of 64 slices shown]
[im 22/64  brain]
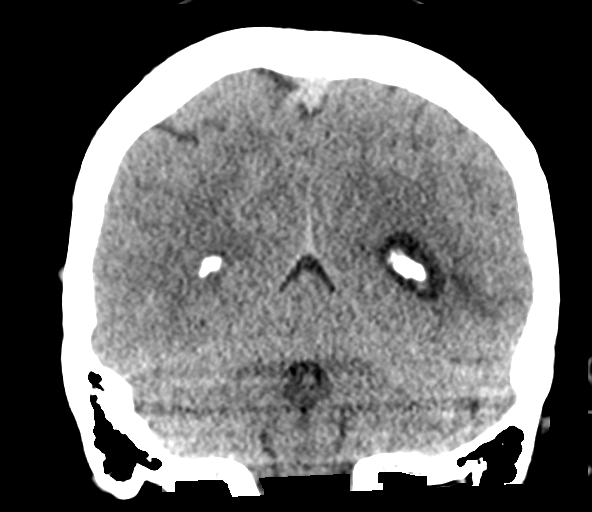
[im 29/64  brain]
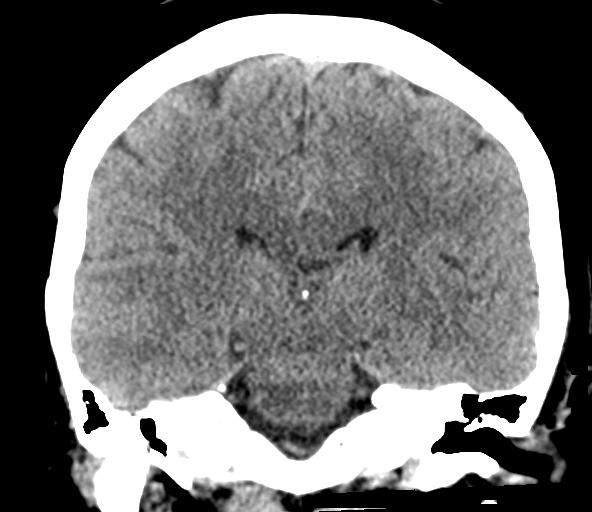
[im 36/64  brain]
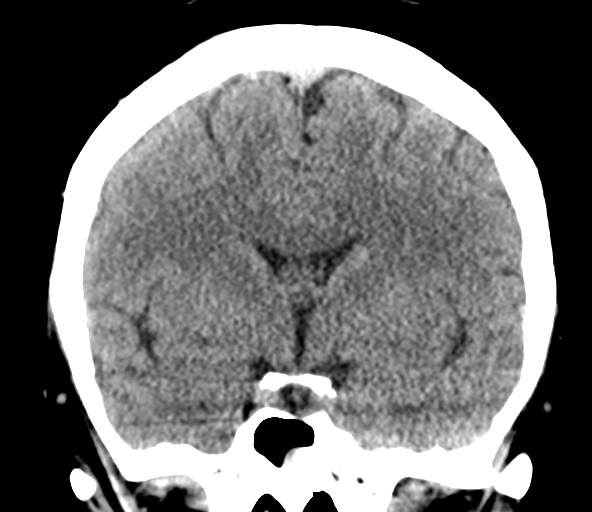

[Series 5: sagittal soft tissue · sagittal · 0.27mm/px · 3 of 52 slices shown]
[im 18/52  brain]
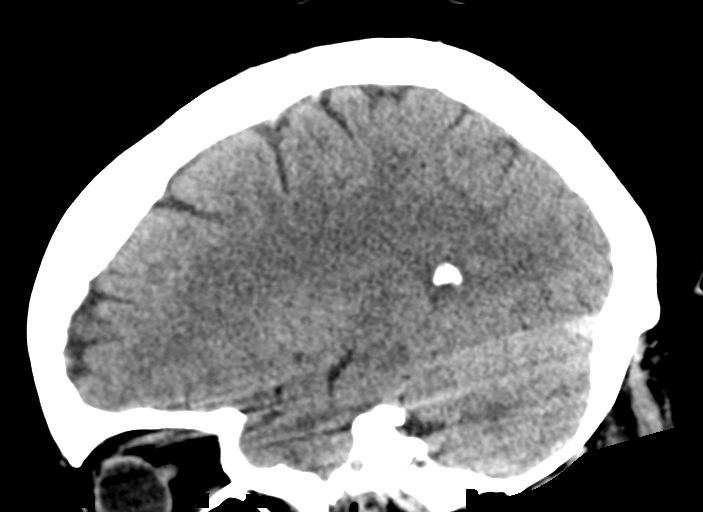
[im 26/52  brain]
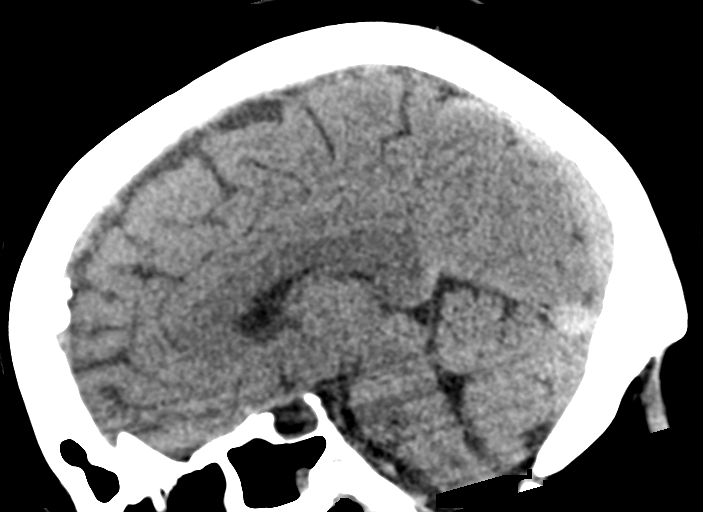
[im 35/52  brain]
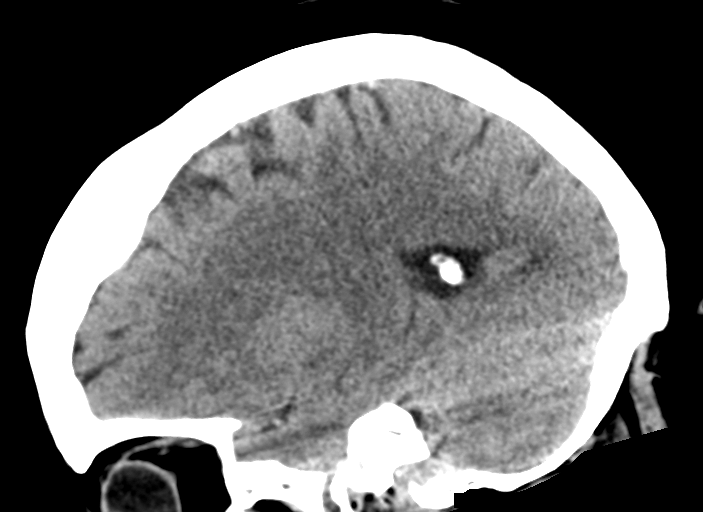

[15 of 47 positions shown; findings below may reference images not displayed]

FINDINGS: Brain: Previous left temporal occipital hemorrhage has resolved,
expected residual encephalomalacia. No new hemorrhage. No evidence
of acute ischemia. No mass effect, midline shift, or hydrocephalus.

Vascular: No hyperdense vessel or unexpected calcification.

Skull: Normal. Negative for fracture or focal lesion.

Sinuses/Orbits: Scattered chronic mucosal thickening and
postsurgical change on the right maxillary sinus. Visualized orbits
are unremarkable. Mastoid air cells are well-aerated.

Other: None.
IMPRESSION: 1.  No acute intracranial abnormality.
2. Encephalomalacia in the left parietal-occipital lobe at site of
prior hemorrhage, the prior hemorrhage has resolved.

## 2018-10-03 IMAGING — DX DG ABD PORTABLE 1V
1 series · 1 of 1 positions shown · non-contrast
Comparison: CT abdomen/pelvis 3 hours prior.

CLINICAL DATA: NG tube placement.

EXAM:
PORTABLE ABDOMEN - 1 VIEW

[abdomen kub]
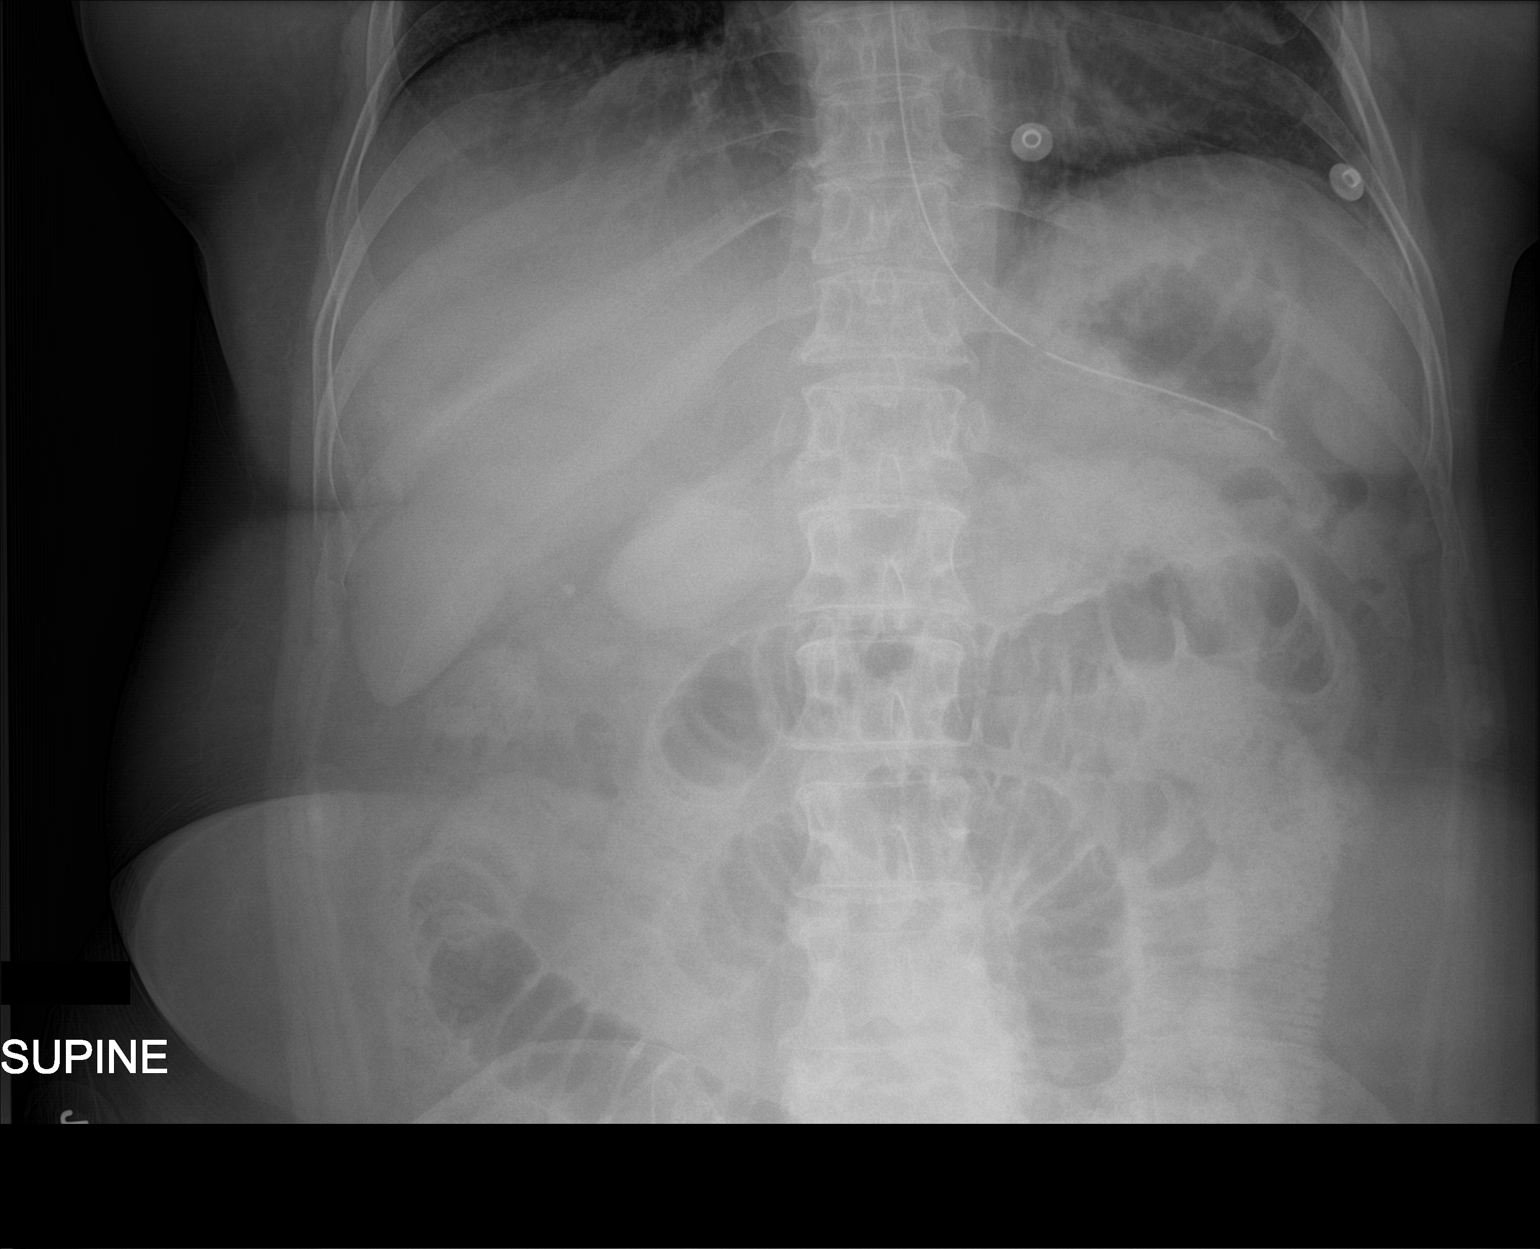

[1 of 1 positions shown; findings below may reference images not displayed]

FINDINGS: Tip and side port of the enteric tube below the diaphragm in the
stomach. Air and contrast filled dilated small bowel loops in the
central abdomen again seen. No evidence of free air.
IMPRESSION: Tip and side port of the enteric tube below the diaphragm in the
stomach.

## 2018-10-08 IMAGING — CR DG ABDOMEN ACUTE W/ 1V CHEST
4 series · 4 of 4 positions shown · non-contrast
Comparison: 09/02/2016.

CLINICAL DATA: Abdominal pain for 1 week.

EXAM:
DG ABDOMEN ACUTE W/ 1V CHEST

[chest pa]
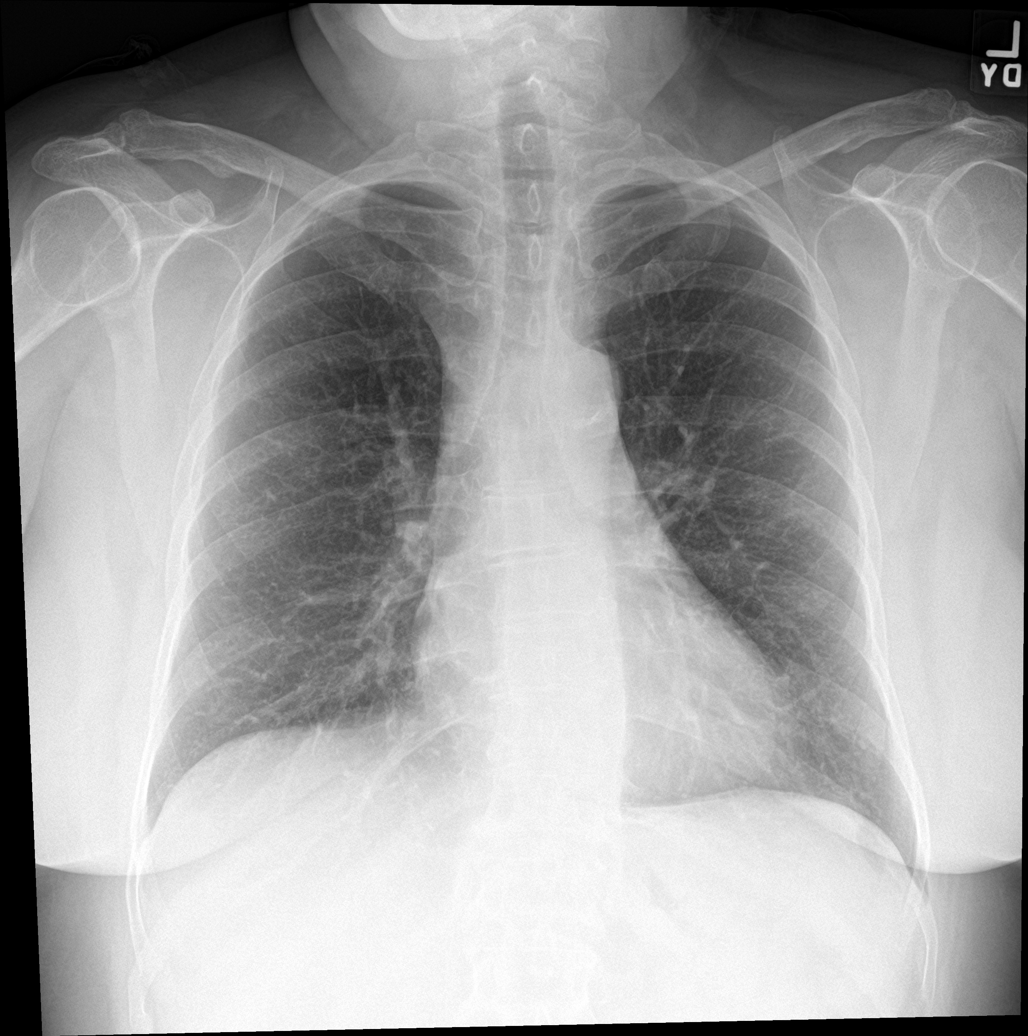

[abdomen erect]
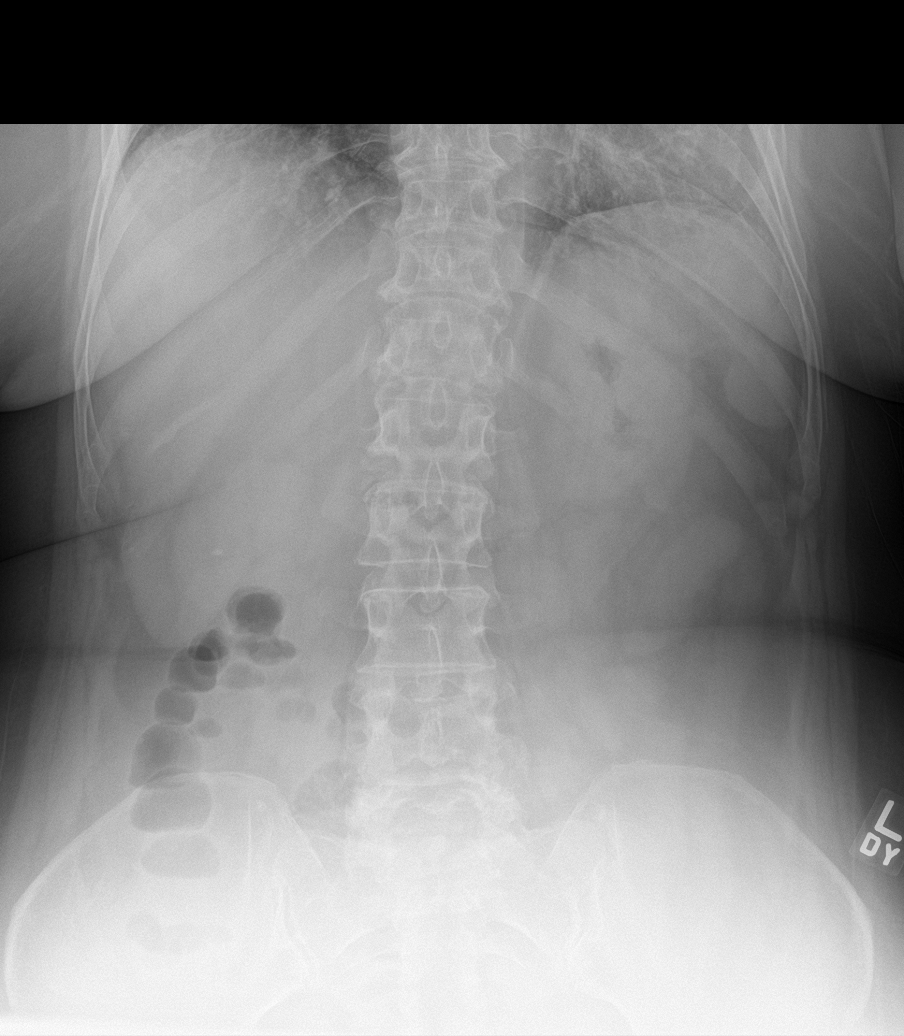

[abdomen supine (1 of 2)]
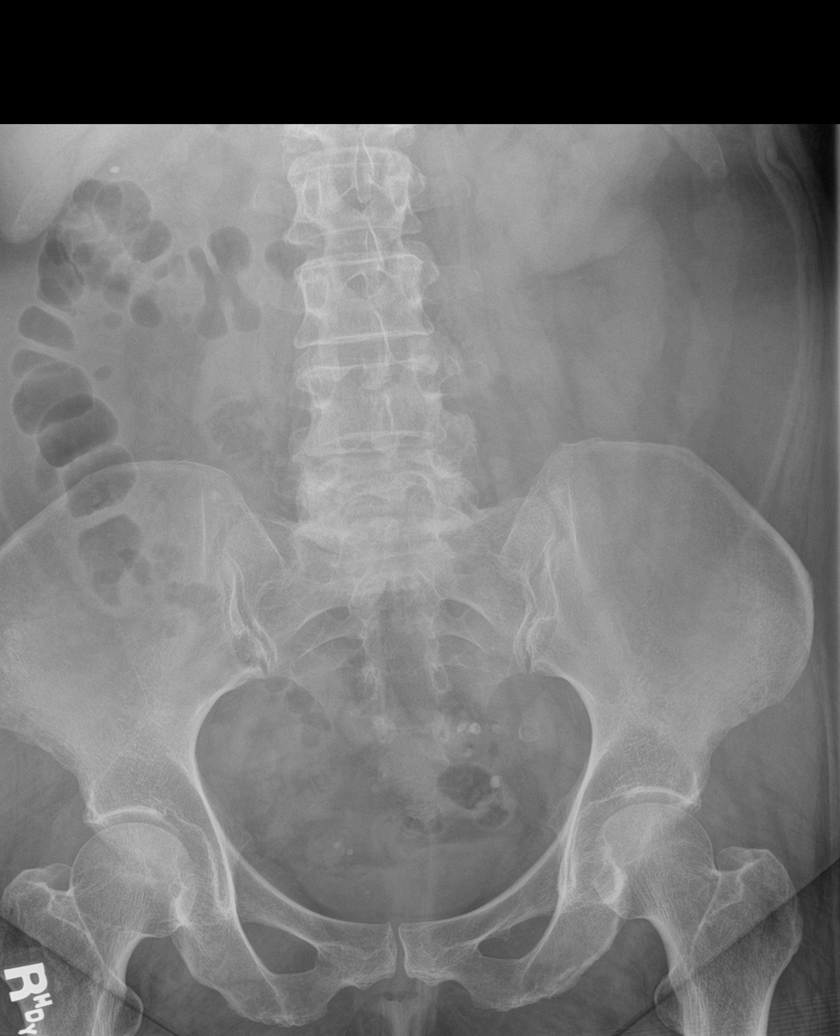

[abdomen supine (2 of 2)]
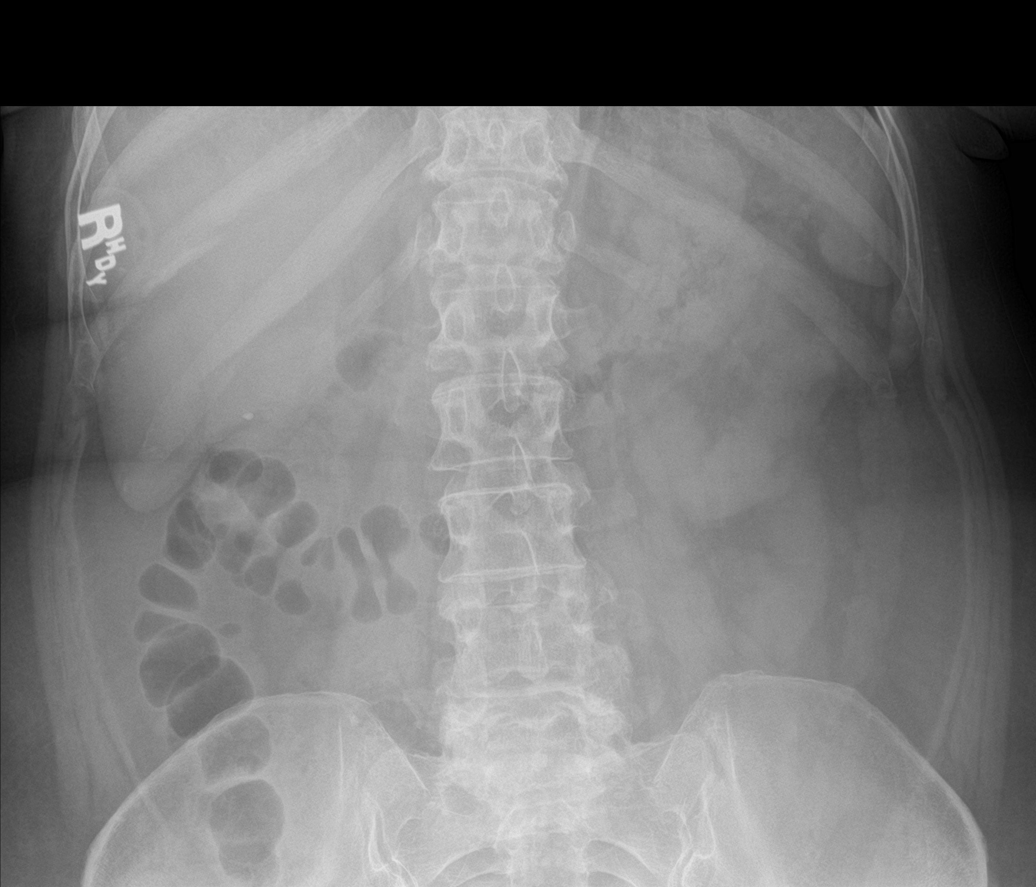

[4 of 4 positions shown; findings below may reference images not displayed]

FINDINGS: Small calcification projects over the midpole of the right kidney,
likely small nonobstructing right renal stone. Calcified phleboliths
in the pelvis. Nonobstructive bowel gas pattern. No free air
organomegaly.

Heart and mediastinal contours are within normal limits. No focal
opacities or effusions. No acute bony abnormality.
IMPRESSION: No acute findings.

Right nephrolithiasis.

## 2018-10-21 ENCOUNTER — Other Ambulatory Visit: Payer: Self-pay

## 2018-10-21 ENCOUNTER — Encounter (HOSPITAL_COMMUNITY): Payer: Self-pay

## 2018-10-21 ENCOUNTER — Emergency Department (HOSPITAL_COMMUNITY)
Admission: EM | Admit: 2018-10-21 | Discharge: 2018-10-21 | Disposition: A | Discharge status: Home / Self Care | Payer: No Typology Code available for payment source | Attending: Emergency Medicine | Admitting: Emergency Medicine

## 2018-10-21 ENCOUNTER — Emergency Department (HOSPITAL_COMMUNITY): Payer: No Typology Code available for payment source

## 2018-10-21 DIAGNOSIS — R1013 Epigastric pain: Secondary | ICD-10-CM | POA: Diagnosis not present

## 2018-10-21 DIAGNOSIS — Z7984 Long term (current) use of oral hypoglycemic drugs: Secondary | ICD-10-CM | POA: Diagnosis not present

## 2018-10-21 DIAGNOSIS — Z8673 Personal history of transient ischemic attack (TIA), and cerebral infarction without residual deficits: Secondary | ICD-10-CM | POA: Diagnosis not present

## 2018-10-21 DIAGNOSIS — R10A0 Flank pain, unspecified side: Secondary | ICD-10-CM

## 2018-10-21 DIAGNOSIS — Z79899 Other long term (current) drug therapy: Secondary | ICD-10-CM | POA: Insufficient documentation

## 2018-10-21 DIAGNOSIS — E119 Type 2 diabetes mellitus without complications: Secondary | ICD-10-CM | POA: Insufficient documentation

## 2018-10-21 DIAGNOSIS — I10 Essential (primary) hypertension: Secondary | ICD-10-CM | POA: Diagnosis not present

## 2018-10-21 DIAGNOSIS — F17210 Nicotine dependence, cigarettes, uncomplicated: Secondary | ICD-10-CM | POA: Insufficient documentation

## 2018-10-21 DIAGNOSIS — Z7902 Long term (current) use of antithrombotics/antiplatelets: Secondary | ICD-10-CM | POA: Insufficient documentation

## 2018-10-21 DIAGNOSIS — R109 Unspecified abdominal pain: Secondary | ICD-10-CM

## 2018-10-21 DIAGNOSIS — R1032 Left lower quadrant pain: Secondary | ICD-10-CM | POA: Diagnosis present

## 2018-10-21 LAB — URINALYSIS, ROUTINE W REFLEX MICROSCOPIC
Bilirubin Urine: NEGATIVE
Glucose, UA: NEGATIVE mg/dL
KETONES UR: NEGATIVE mg/dL
Nitrite: NEGATIVE
PH: 6 (ref 5.0–8.0)
Protein, ur: NEGATIVE mg/dL
Specific Gravity, Urine: 1.003 — ABNORMAL LOW (ref 1.005–1.030)

## 2018-10-21 LAB — COMPREHENSIVE METABOLIC PANEL
ALK PHOS: 110 U/L (ref 38–126)
ALT: 18 U/L (ref 0–44)
ANION GAP: 9 (ref 5–15)
AST: 19 U/L (ref 15–41)
Albumin: 4 g/dL (ref 3.5–5.0)
BUN: 8 mg/dL (ref 6–20)
CO2: 24 mmol/L (ref 22–32)
Calcium: 9.5 mg/dL (ref 8.9–10.3)
Chloride: 104 mmol/L (ref 98–111)
Creatinine, Ser: 0.63 mg/dL (ref 0.44–1.00)
GFR calc Af Amer: >60 mL/min (ref >60)
GFR calc non Af Amer: >60 mL/min (ref >60)
Glucose, Bld: 111 mg/dL — ABNORMAL HIGH (ref 70–99)
Potassium: 4.3 mmol/L (ref 3.5–5.1)
Sodium: 137 mmol/L (ref 135–145)
Total Bilirubin: 0.4 mg/dL (ref 0.3–1.2)
Total Protein: 7.3 g/dL (ref 6.5–8.1)

## 2018-10-21 LAB — CBC
HCT: 46.3 % — ABNORMAL HIGH (ref 36.0–46.0)
Hemoglobin: 14.6 g/dL (ref 12.0–15.0)
MCH: 29.8 pg (ref 26.0–34.0)
MCHC: 31.5 g/dL (ref 30.0–36.0)
MCV: 94.5 fL (ref 80.0–100.0)
Platelets: 211 10*3/uL (ref 150–400)
RBC: 4.9 MIL/uL (ref 3.87–5.11)
RDW: 13.5 % (ref 11.5–15.5)
WBC: 6.5 10*3/uL (ref 4.0–10.5)
nRBC: 0 % (ref 0.0–0.2)

## 2018-10-21 LAB — I-STAT BETA HCG BLOOD, ED (MC, WL, AP ONLY): I-stat hCG, quantitative: 7 m[IU]/mL — ABNORMAL HIGH (ref <5)

## 2018-10-21 LAB — LIPASE, BLOOD: Lipase: 33 U/L (ref 11–51)

## 2018-10-21 LAB — PREGNANCY, URINE: Preg Test, Ur: NEGATIVE

## 2018-10-21 MED ORDER — HYDROMORPHONE HCL 1 MG/ML IJ SOLN
0.5000 mg | Freq: Once | INTRAMUSCULAR | Status: AC
  Administered 2018-10-21: 0.5 mg via INTRAVENOUS
  Filled 2018-10-21: qty 1

## 2018-10-21 MED ORDER — SODIUM CHLORIDE 0.9% FLUSH: 3.0000 mL | Freq: Once | INTRAVENOUS | Status: DC

## 2018-10-21 MED ORDER — PROMETHAZINE HCL 25 MG/ML IJ SOLN
12.5000 mg | Freq: Once | INTRAMUSCULAR | Status: AC
  Administered 2018-10-21: 12.5 mg via INTRAVENOUS
  Filled 2018-10-21: qty 1

## 2018-10-21 MED ORDER — SODIUM CHLORIDE 0.9 % IV BOLUS
500.0000 mL | Freq: Once | INTRAVENOUS | Status: AC
  Administered 2018-10-21: 500 mL via INTRAVENOUS

## 2018-10-21 MED ORDER — OXYCODONE HCL 5 MG PO TABS
5.0000 mg | ORAL_TABLET | Freq: Once | ORAL | Status: AC
  Administered 2018-10-21: 5 mg via ORAL
  Filled 2018-10-21: qty 1

## 2018-10-21 MED ORDER — ONDANSETRON HCL 4 MG PO TABS: 4.0000 mg | ORAL_TABLET | Freq: Two times a day (BID) | ORAL | 0 refills | Status: DC

## 2018-10-21 NOTE — ED Provider Notes (Signed)
  Physical Exam  BP 121/65 (BP Location: Right Arm)   Pulse 74   Temp 98 F (36.7 C) (Oral)   Resp 16   Ht 5\' 2"  (1.575 m)   Wt 86.2 kg   SpO2 95%   BMI 34.75 kg/m   Physical Exam  ED Course/Procedures     Procedures  MDM  Patient care received by Rhea Bleacher PA at shift change pending CT ABD & Pelvis, please see his note for a full HPI. Briefly, patient with a previous history of kidney stones and diverticulitis presents to the ED with worsening abdominal pain, reports this pain is new for her.  CT ABD& Pelvis showed: 1. Sigmoid diverticulosis without evidence of acute diverticulitis.  No acute non-contrast CT findings to explain left-sided abdominal  pain.    2. Other chronic and incidental findings as above.     5:35 PM Patient has been informed of her results. Tried to prescribe patient Zofran but she reports she is allergic to medication and cannot take this. She also reported she will schedule an appointment with her gastroenterologist Dr. Laural Benes in [place].  Advised to take OTC medication.      Claude Manges, PA-C 10/21/18 1748    Charlynne Pander, MD 10/21/18 [phone]

## 2018-10-21 NOTE — Discharge Instructions (Addendum)
All your laboratory results and CT were within normal limits.I have prescribed medication to help with your nausea. You may alternate ibuprofen or tylenol for your pain. Please follow up with your primary care physician as needed.

## 2018-10-21 NOTE — ED Triage Notes (Addendum)
Patient c/o left lower abdominal pain and left flank pain that started 2 days ago. Patient reports a history of kidney stones and UTI"s.  Patient came to the triage door and reported that she had bug bites on her left arm and hand.

## 2018-10-21 NOTE — ED Notes (Signed)
Patient transported to CT 

## 2018-10-21 NOTE — ED Provider Notes (Signed)
[facility]-EMERGENCY DEPT Provider Note   CSN: [id number] Arrival date & time: 10/21/18  2334     History   Chief Complaint Chief Complaint  Patient presents with  . Abdominal Pain  . Flank Pain  . Insect Bite    HPI Stephanie [name] is a 53 y.o. female.  Patient with history of diverticulitis, UTI, diabetes, previous stroke on clopidogrel --presents the emergency department with 3-day history of abdominal pain and nausea.  Patient reports pain in the left side of the abdomen and left flank.  She has watery nonbloody diarrhea since onset.  She also reports urinary urgency but no dysuria, hematuria, or increased frequency.  She denies chest pain or shortness of breath.  She is uncertain if her symptoms are due to diverticulitis, kidney stones, or other issue.  She does not take any medications prior to arrival other than Excedrin Migraine yesterday for headache.  She has a surgical history of hysterectomy and C-sections.  The onset of this condition was acute. The course is constant. Aggravating factors: none. Alleviating factors: none.       Past Medical History:  Diagnosis Date  . Diabetes mellitus without complication (HCC)   . Difficult intubation   . Diverticulitis   . Headache   . Hepatitis C    treated and resolved  . History of kidney stones   . Hypertension   . IBS (irritable bowel syndrome)   . Kidney stones   . Sciatica   . Sleep apnea    on cpap  . Stroke [facility]) 2017   memory loss    Patient Active Problem List   Diagnosis Date Noted  . Hypotension 08/22/2018  . Bradycardia 08/22/2018  . Type 2 diabetes mellitus (HCC) 08/22/2018  . Acute encephalopathy 08/21/2018  . Shoulder pain, right 12/22/2017  . UTI (urinary tract infection) 08/21/2017  . Nausea & vomiting 08/21/2017  . Nicotine dependence 08/21/2017  . Headache 01/07/2017  . Small bowel obstruction (HCC) 09/02/2016  . Pain and swelling of left upper extremity 07/22/2016    . Superficial venous thrombosis of arm, left 07/22/2016  . ICH (intracerebral hemorrhage) (HCC)   . Essential hypertension, malignant 04/19/2016  . Cytotoxic brain edema (HCC) 04/19/2016  . IVH (intraventricular hemorrhage) (HCC) 04/18/2016  . Hepatitis C     Past Surgical History:  Procedure Laterality Date  . ABDOMINAL HYSTERECTOMY    . carpel tunnel syndrome  2017  . HARDWARE REMOVAL Right 12/22/2017   Procedure: HARDWARE REMOVAL-RIGHT ARM;  Surgeon: Lyndle Herrlich, MD;  Location: [facility] ORS;  Service: Orthopedics;  Laterality: Right;  Removal of implant, superficial right humerus  . HUMERUS IM NAIL Right 03/11/2017   Procedure: INTRAMEDULLARY (IM) NAIL HUMERAL;  Surgeon: Lyndle Herrlich, MD;  Location: [facility] ORS;  Service: Orthopedics;  Laterality: Right;  . KIDNEY STONE SURGERY Right 02/12/12  . KIDNEY STONE SURGERY Left 07/15/2012  . LIGATION OF ARTERIOVENOUS  FISTULA Left 06/21/2016   Procedure: LIGATION OF ARTERIOVENOUS  FISTULA ( LIGATION BASILIC VEIN );  Surgeon: Renford Dills, MD;  Location: [facility] ORS;  Service: Vascular;  Laterality: Left;  . LITHOTRIPSY    . OOPHORECTOMY    . SINUS EXPLORATION    . TONSILLECTOMY       OB History   No obstetric history on file.      Home Medications    Prior to Admission medications   Medication Sig Start Date End Date Taking? Authorizing Provider  amLODipine (NORVASC) 5 MG tablet  Take 5 mg by mouth daily.  06/25/18   [provider]  atorvastatin (LIPITOR) 40 MG tablet Take 40 mg by mouth every evening.    [provider]  cloNIDine (CATAPRES) 0.1 MG tablet Take 0.1 mg by mouth 2 (two) times daily.  06/20/18   [provider]  clopidogrel (PLAVIX) 75 MG tablet Take 1 tablet (75 mg total) by mouth daily. 06/27/16   Marvel PlanXu, Jindong, MD  diltiazem (CARDIZEM CD) 180 MG 24 hr capsule Take 180 mg by mouth daily.    [provider]  FLUoxetine (PROZAC) 40 MG capsule Take 40 mg by mouth daily.  06/28/18    [provider]  Galcanezumab-gnlm (EMGALITY) 120 MG/ML SOSY Inject 120 mg into the skin every 30 (thirty) days.    [provider]  glipiZIDE (GLUCOTROL) 5 MG tablet Take 1 tablet (5 mg total) by mouth 2 (two) times daily before a meal. 04/30/16   Lora PaulaKrall, Jennifer T, MD  loratadine (CLARITIN) 10 MG tablet Take 10 mg by mouth daily.    [provider]  metoprolol succinate (TOPROL-XL) 50 MG 24 hr tablet Take 50 mg by mouth daily. 06/03/18   [provider]  omeprazole (PRILOSEC) 40 MG capsule Take 40 mg by mouth daily.    [provider]    Family History Family History  Problem Relation Age of Onset  . Heart failure Mother   . Diabetes Father   . Cancer Sister     Social History Social History   Tobacco Use  . Smoking status: Current Every Day Smoker    Packs/day: 1.00    Years: 1.50    Pack years: 1.50    Types: Cigarettes  . Smokeless tobacco: Never Used  Substance Use Topics  . Alcohol use: Not Currently    Comment: no alcohol since 2002  . Drug use: No    Comment: electric cig     Allergies   Gabapentin; Tylenol [acetaminophen]; Aspirin; Buprenorphine hcl; Codeine; Compazine; Ioxaglate; Ivp dye [iodinated diagnostic agents]; Metrizamide; Naproxen; Norco [hydrocodone-acetaminophen]; Penicillin g; Penicillins; Prochlorperazine maleate; Reglan [metoclopramide]; Sulfur; Tegretol [carbamazepine]; Toradol [ketorolac tromethamine]; Tramadol; and Zofran [ondansetron hcl]   Review of Systems Review of Systems  Constitutional: Negative for fever.  HENT: Negative for rhinorrhea and sore throat.   Eyes: Negative for redness.  Respiratory: Negative for cough and shortness of breath.   Cardiovascular: Negative for chest pain.  Gastrointestinal: Positive for abdominal pain and nausea. Negative for diarrhea and vomiting.  Genitourinary: Positive for flank pain and urgency. Negative for dysuria, frequency and hematuria.  Musculoskeletal:  Negative for myalgias.  Skin: Negative for rash.  Neurological: Negative for headaches.     Physical Exam Updated Vital Signs BP (!) 143/86   Pulse 73   Temp 98 F (36.7 C) (Oral)   Resp 17   Ht 5\' 2"  (1.575 m)   Wt 86.2 kg   SpO2 98%   BMI 34.75 kg/m   Physical Exam Vitals signs and nursing note reviewed.  Constitutional:      Appearance: She is well-developed.  HENT:     Head: Normocephalic and atraumatic.  Eyes:     General:        Right eye: No discharge.        Left eye: No discharge.     Conjunctiva/sclera: Conjunctivae normal.  Neck:     Musculoskeletal: Normal range of motion and neck supple.  Cardiovascular:     Rate and Rhythm: Normal rate and regular rhythm.  Heart sounds: Normal heart sounds.  Pulmonary:     Effort: Pulmonary effort is normal.     Breath sounds: Normal breath sounds.  Abdominal:     Palpations: Abdomen is soft.     Tenderness: There is abdominal tenderness (moderate tenderness, patient winces when I press over L abdomen, appears uncomfortable) in the epigastric area, periumbilical area, suprapubic area, left upper quadrant and left lower quadrant. There is guarding. There is no rebound.  Skin:    General: Skin is warm and dry.  Neurological:     Mental Status: She is alert.      ED Treatments / Results  Labs (all labs ordered are listed, but only abnormal results are displayed) Labs Reviewed  COMPREHENSIVE METABOLIC PANEL - Abnormal; Notable for the following components:      Result Value   Glucose, Bld 111 (*)    All other components within normal limits  CBC - Abnormal; Notable for the following components:   HCT 46.3 (*)    All other components within normal limits  URINALYSIS, ROUTINE W REFLEX MICROSCOPIC - Abnormal; Notable for the following components:   Specific Gravity, Urine 1.003 (*)    Hgb urine dipstick SMALL (*)    Leukocytes,Ua TRACE (*)    Bacteria, UA MANY (*)    All other components within normal limits    I-STAT BETA HCG BLOOD, ED ([facility], [facility], [facility] ONLY) - Abnormal; Notable for the following components:   I-stat hCG, quantitative 7.0 (*)    All other components within normal limits  LIPASE, BLOOD  PREGNANCY, URINE    EKG None  Radiology No results found.  Procedures Procedures (including critical care time)  Medications Ordered in ED Medications  sodium chloride flush (NS) 0.9 % injection 3 mL (3 mLs Intravenous Not Given 10/21/18 1215)  HYDROmorphone (DILAUDID) injection 0.5 mg (0.5 mg Intravenous Given 10/21/18 1231)  promethazine (PHENERGAN) injection 12.5 mg (12.5 mg Intravenous Given 10/21/18 1229)  sodium chloride 0.9 % bolus 500 mL (0 mLs Intravenous Stopped 10/21/18 1450)  oxyCODONE (Oxy IR/ROXICODONE) immediate release tablet 5 mg (5 mg Oral Given 10/21/18 1451)     Initial Impression / Assessment and Plan / ED Course  I have reviewed the triage vital signs and the nursing notes.  Pertinent labs & imaging results that were available during my care of the patient were reviewed by me and considered in my medical decision making (see chart for details).     Patient seen and examined. Work-up initiated. Medications ordered.   Vital signs reviewed and are as follows: BP (!) 143/86   Pulse 73   Temp 98 F (36.7 C) (Oral)   Resp 17   Ht 5\' 2"  (1.575 m)   Wt 86.2 kg   SpO2 98%   BMI 34.75 kg/m   4:21 PM patient discussed with and seen by earlier with Dr. Rhunette Croft.  Agrees with CT imaging.  Lab work is reassuring.  Handoff to [place] PA-C at shift change.  We will follow-up on CT results.  If negative, plan discharged home with symptomatic treatment.  Final Clinical Impressions(s) / ED Diagnoses   Final diagnoses:  Epigastric pain  Flank pain   Pending completion of work-up.  ED Discharge Orders    None       Renne Crigler, Cordelia Poche 10/21/18 1622    Derwood Kaplan, MD 10/21/18 1623

## 2018-10-27 ENCOUNTER — Telehealth: Payer: Self-pay

## 2018-10-27 NOTE — Telephone Encounter (Signed)
revised 

## 2018-10-27 NOTE — Telephone Encounter (Signed)
I called patients PCP and spoke with Paulino Rily. I explained that pt is trying to get a referral for a EMG/NCV at our office. Morrie Sheldon stated pt saw  Orson Eva NP on 10/23/2018 and a referral was sent to [facility] with Dr.Shah for nerve pain and  EMG/NCV test. I stated the pt call our office stating her PCP wanted her to have the test her. Paulino Rily stated it was sent on 10/26/2018 to Dr. Sherryll Burger in [place]. I explained that pt had 4 no shows.She had three with Dr. Roda Shutters and one with Dr. Anne Hahn. Morrie Sheldon stated pt also has a history of not coming to appts at their office. I stated Dr Roda Shutters did not want to dismiss pt so ask Dr. Sherryll Burger could he take over neurology care because of the distance. I request Morrie Sheldon RN make pts PCP aware pt is asking for a referral at [facility]. Morrie Sheldon appreciate the phone call.

## 2018-10-27 NOTE — Telephone Encounter (Addendum)
I receive a message from Stephanie [name] in phone room that pt wants to see Dr. Pearlean Brownie or Shanda Bumps NP for nerve issues in legs and get a EMG. I explained to Stephanie [name] patient was transfer to neurologist in [place] because she had 3 no shows at [facility] in 2018 due to being far away.I called patient to state Dr.Shah can do a EMG and NCV at his office because she is establish with him now for headaches. Pt states "I want to transfer care back to [facility] because I dont like the neurologist office in [place]". I explained to pt that Shanda Bumps NP and Dr Pearlean Brownie only specialized in stroke. I also stated she is on Emgality and other headaches medicine. Pt stated she wants to be seen for nerve pain in her back and legs.I explained she can be seen by anyone for nerve pain in her legs.Also this is a new issue for pt and she can be seen by any Md at [facility] if she chose to come back here. Pt stated she will talk to her PCP about a referral.

## 2018-11-13 ENCOUNTER — Emergency Department (HOSPITAL_COMMUNITY)
Admission: EM | Admit: 2018-11-13 | Discharge: 2018-11-13 | Disposition: A | Discharge status: Home / Self Care | Payer: No Typology Code available for payment source | Attending: Emergency Medicine | Admitting: Emergency Medicine

## 2018-11-13 ENCOUNTER — Encounter (HOSPITAL_COMMUNITY): Payer: Self-pay | Admitting: Emergency Medicine

## 2018-11-13 ENCOUNTER — Other Ambulatory Visit: Payer: Self-pay

## 2018-11-13 DIAGNOSIS — Z7901 Long term (current) use of anticoagulants: Secondary | ICD-10-CM | POA: Insufficient documentation

## 2018-11-13 DIAGNOSIS — F17210 Nicotine dependence, cigarettes, uncomplicated: Secondary | ICD-10-CM | POA: Diagnosis not present

## 2018-11-13 DIAGNOSIS — Z79899 Other long term (current) drug therapy: Secondary | ICD-10-CM | POA: Insufficient documentation

## 2018-11-13 DIAGNOSIS — E119 Type 2 diabetes mellitus without complications: Secondary | ICD-10-CM | POA: Diagnosis not present

## 2018-11-13 DIAGNOSIS — Z8673 Personal history of transient ischemic attack (TIA), and cerebral infarction without residual deficits: Secondary | ICD-10-CM | POA: Diagnosis not present

## 2018-11-13 DIAGNOSIS — I10 Essential (primary) hypertension: Secondary | ICD-10-CM | POA: Insufficient documentation

## 2018-11-13 DIAGNOSIS — G43809 Other migraine, not intractable, without status migrainosus: Secondary | ICD-10-CM | POA: Diagnosis not present

## 2018-11-13 DIAGNOSIS — G43909 Migraine, unspecified, not intractable, without status migrainosus: Secondary | ICD-10-CM | POA: Diagnosis present

## 2018-11-13 MED ORDER — SODIUM CHLORIDE 0.9 % IV BOLUS
1000.0000 mL | Freq: Once | INTRAVENOUS | Status: AC
  Administered 2018-11-13: 1000 mL via INTRAVENOUS

## 2018-11-13 MED ORDER — PROMETHAZINE HCL 25 MG PO TABS
12.5000 mg | ORAL_TABLET | Freq: Once | ORAL | Status: AC
  Administered 2018-11-13: 12.5 mg via ORAL
  Filled 2018-11-13: qty 1

## 2018-11-13 MED ORDER — ACETAMINOPHEN 500 MG PO TABS
1000.0000 mg | ORAL_TABLET | Freq: Once | ORAL | Status: AC
  Administered 2018-11-13: 1000 mg via ORAL
  Filled 2018-11-13: qty 2

## 2018-11-13 MED ORDER — DIPHENHYDRAMINE HCL 50 MG/ML IJ SOLN
50.0000 mg | Freq: Once | INTRAMUSCULAR | Status: AC
  Administered 2018-11-13: 50 mg via INTRAVENOUS
  Filled 2018-11-13: qty 1

## 2018-11-13 MED ORDER — SUMATRIPTAN SUCCINATE 6 MG/0.5ML ~~LOC~~ SOLN
6.0000 mg | Freq: Once | SUBCUTANEOUS | Status: AC
  Administered 2018-11-13: 6 mg via SUBCUTANEOUS
  Filled 2018-11-13: qty 0.5

## 2018-11-13 MED ORDER — HALOPERIDOL LACTATE 5 MG/ML IJ SOLN
5.0000 mg | Freq: Once | INTRAMUSCULAR | Status: AC
  Administered 2018-11-13: 5 mg via INTRAVENOUS
  Filled 2018-11-13: qty 1

## 2018-11-13 NOTE — ED Notes (Signed)
Pt was given benadryl, pt states that it is not going to help my pain and that only morphine helps and if she does not get this medication she wants to leave, and that she is not going to sit here in pain for 3 more  Hours. Pt made aware that providers are aware of her position and that writer will make them aware of her request again.

## 2018-11-13 NOTE — ED Provider Notes (Addendum)
[facility]-EMERGENCY DEPT Provider Note   CSN: [id number] Arrival date & time: 11/13/18  1441    History   Chief Complaint Chief Complaint  Patient presents with  . Migraine  . Back Pain  . Emesis    HPI Stephanie [name] is a 53 y.o. female with medical history significant for diabetes, hypertension, CVA with chronic right sided residual deficits, migraines presenting to emergency department today with chief complaint of migraine x2 days.  She states the pain is all over her head and radiates to her neck.  She describes it as throbbing that is constant and rates it 10 out of 10 in severity.  She took Excedrin yesterday without relief.  She has not taken anything for her headache today.  Patient states associated nausea, vomiting.  She estimates 6 episodes of nonbloody and nonbilious emesis.  Patient is requesting Dilaudid or morphine for headache.  Patient also describes her blurry vision as seeing double, this is normal for her in the with migraines.  Patient denies sudden onset and states the headache has gradually worsened over last 2 days.  Patient has low back pain.  She describes it as feeling sore.  She rates it as 10 out of 10 in severity.  Denies fever, saddle anesthesia, numbness and weakness, history of cancer, urinary retention, bowel and bladder incontinence.      Past Medical History:  Diagnosis Date  . Diabetes mellitus without complication (HCC)   . Difficult intubation   . Diverticulitis   . Headache   . Hepatitis C    treated and resolved  . History of kidney stones   . Hypertension   . IBS (irritable bowel syndrome)   . Kidney stones   . Sciatica   . Sleep apnea    on cpap  . Stroke [facility]) 2017   memory loss    Patient Active Problem List   Diagnosis Date Noted  . Hypotension 08/22/2018  . Bradycardia 08/22/2018  . Type 2 diabetes mellitus (HCC) 08/22/2018  . Acute encephalopathy 08/21/2018  . Shoulder pain, right 12/22/2017    . UTI (urinary tract infection) 08/21/2017  . Nausea & vomiting 08/21/2017  . Nicotine dependence 08/21/2017  . Headache 01/07/2017  . Small bowel obstruction (HCC) 09/02/2016  . Pain and swelling of left upper extremity 07/22/2016  . Superficial venous thrombosis of arm, left 07/22/2016  . ICH (intracerebral hemorrhage) (HCC)   . Essential hypertension, malignant 04/19/2016  . Cytotoxic brain edema (HCC) 04/19/2016  . IVH (intraventricular hemorrhage) (HCC) 04/18/2016  . Hepatitis C     Past Surgical History:  Procedure Laterality Date  . ABDOMINAL HYSTERECTOMY    . carpel tunnel syndrome  2017  . HARDWARE REMOVAL Right 12/22/2017   Procedure: HARDWARE REMOVAL-RIGHT ARM;  Surgeon: Lyndle Herrlich, MD;  Location: [facility] ORS;  Service: Orthopedics;  Laterality: Right;  Removal of implant, superficial right humerus  . HUMERUS IM NAIL Right 03/11/2017   Procedure: INTRAMEDULLARY (IM) NAIL HUMERAL;  Surgeon: Lyndle Herrlich, MD;  Location: [facility] ORS;  Service: Orthopedics;  Laterality: Right;  . KIDNEY STONE SURGERY Right 02/12/12  . KIDNEY STONE SURGERY Left 07/15/2012  . LIGATION OF ARTERIOVENOUS  FISTULA Left 06/21/2016   Procedure: LIGATION OF ARTERIOVENOUS  FISTULA ( LIGATION BASILIC VEIN );  Surgeon: Renford Dills, MD;  Location: [facility] ORS;  Service: Vascular;  Laterality: Left;  . LITHOTRIPSY    . OOPHORECTOMY    . SINUS EXPLORATION    . TONSILLECTOMY  OB History   No obstetric history on file.      Home Medications    Prior to Admission medications   Medication Sig Start Date End Date Taking? Authorizing Provider  alprazolam Prudy Feeler) 2 MG tablet Take 2 mg by mouth 2 (two) times daily as needed for anxiety. 10/13/18   [provider]  cloNIDine (CATAPRES) 0.2 MG tablet Take 0.2 mg by mouth 2 (two) times daily. 10/02/18   [provider]  clopidogrel (PLAVIX) 75 MG tablet Take 1 tablet (75 mg total) by mouth daily. 06/27/16   Marvel Plan, MD  FLUoxetine  (PROZAC) 40 MG capsule Take 40 mg by mouth daily.  06/28/18   [provider]  Galcanezumab-gnlm (EMGALITY) 120 MG/ML SOSY Inject 120 mg into the skin every 30 (thirty) days.    [provider]  glipiZIDE (GLUCOTROL) 5 MG tablet Take 1 tablet (5 mg total) by mouth 2 (two) times daily before a meal. 04/30/16   Lora Paula, MD  loratadine (CLARITIN) 10 MG tablet Take 10 mg by mouth daily.    [provider]  losartan (COZAAR) 100 MG tablet Take 100 mg by mouth daily. 10/02/18   [provider]  metoprolol succinate (TOPROL-XL) 50 MG 24 hr tablet Take 50 mg by mouth daily. 06/03/18   [provider]  omeprazole (PRILOSEC) 40 MG capsule Take 40 mg by mouth daily.    [provider]  SYMBICORT 160-4.5 MCG/ACT inhaler Inhale 2 puffs into the lungs 2 (two) times daily. 09/08/18   [provider]  zolpidem (AMBIEN) 10 MG tablet Take 10 mg by mouth at bedtime as needed for sleep. 10/13/18   [provider]    Family History Family History  Problem Relation Age of Onset  . Heart failure Mother   . Diabetes Father   . Cancer Sister     Social History Social History   Tobacco Use  . Smoking status: Current Every Day Smoker    Packs/day: 1.00    Years: 1.50    Pack years: 1.50    Types: Cigarettes  . Smokeless tobacco: Never Used  Substance Use Topics  . Alcohol use: Not Currently    Comment: no alcohol since 2002  . Drug use: No    Comment: electric cig     Allergies   Gabapentin; Tylenol [acetaminophen]; Aspirin; Buprenorphine hcl; Codeine; Compazine; Ioxaglate; Ivp dye [iodinated diagnostic agents]; Metrizamide; Naproxen; Norco [hydrocodone-acetaminophen]; Penicillin g; Penicillins; Prochlorperazine maleate; Reglan [metoclopramide]; Sulfur; Tegretol [carbamazepine]; Toradol [ketorolac tromethamine]; Tramadol; and Zofran [ondansetron hcl]   Review of Systems Review of Systems  Constitutional: Negative for chills  and fever.  Respiratory: Negative for shortness of breath.   Cardiovascular: Negative for chest pain.  Gastrointestinal: Positive for nausea and vomiting.  Genitourinary: Negative for dysuria and frequency.  Musculoskeletal: Positive for back pain.  Neurological: Positive for headaches. Negative for dizziness, weakness, light-headedness and numbness.  All other systems reviewed and are negative.    Physical Exam Updated Vital Signs BP (!) 151/96 (BP Location: Right Arm)   Pulse 93   Temp 98.3 F (36.8 C) (Oral)   Resp 20   SpO2 99%   Physical Exam Vitals signs and nursing note reviewed.  Constitutional:      Appearance: She is well-developed. She is not toxic-appearing.     Comments: Patient appears uncomfortable secondary to pain.  She is in no acute distress.  HENT:     Head: Normocephalic and atraumatic.     Comments:  Nontender to palpation of bilateral temples. Eyes:     General: No scleral icterus.       Right eye: No discharge.        Left eye: No discharge.     Extraocular Movements: Extraocular movements intact.     Conjunctiva/sclera: Conjunctivae normal.     Pupils: Pupils are equal, round, and reactive to light.  Neck:     Musculoskeletal: Normal range of motion. No muscular tenderness.     Comments: Full ROM intact without spinous process TTP. No bony stepoffs or deformities, No paraspinous muscle TTP or muscle spasms. No rigidity or meningeal signs. No bruising, erythema, or swelling.  Cardiovascular:     Rate and Rhythm: Normal rate and regular rhythm.     Pulses: Normal pulses.          Radial pulses are 2+ on the right side and 2+ on the left side.     Heart sounds: Normal heart sounds.  Pulmonary:     Effort: Pulmonary effort is normal.     Breath sounds: Normal breath sounds.  Abdominal:     General: There is no distension.     Palpations: Abdomen is soft.     Tenderness: There is no abdominal tenderness.  Musculoskeletal: Normal range of motion.       Right lower leg: No edema.     Left lower leg: No edema.     Comments: No midline T-spine or L-spine tenderness.  Tender to palpation over right sided paraspinal muscles of lumbar spine.  No overlying rash, ecchymosis, or erythema.  DP pulses 2+ bilaterally  Skin:    General: Skin is warm and dry.  Neurological:     Mental Status: She is oriented to person, place, and time.     Comments: Sensation grossly intact to light touch in the lower extremities bilaterally. No saddle anesthesias. Strength 5/5 with flexion and extension at the bilateral hips, knees, and ankles. No noted gait deficit. Coordination intact with heel to shin testing.   Psychiatric:        Behavior: Behavior normal.      ED Treatments / Results  Labs (all labs ordered are listed, but only abnormal results are displayed) Labs Reviewed - No data to display  EKG None  Radiology No results found.  Procedures Procedures (including critical care time)  Medications Ordered in ED Medications  acetaminophen (TYLENOL) tablet 1,000 mg (1,000 mg Oral Given 11/13/18 1617)  promethazine (PHENERGAN) tablet 12.5 mg (12.5 mg Oral Given 11/13/18 1617)  sodium chloride 0.9 % bolus 1,000 mL (0 mLs Intravenous Stopped 11/13/18 1926)  diphenhydrAMINE (BENADRYL) injection 50 mg (50 mg Intravenous Given 11/13/18 1751)  haloperidol lactate (HALDOL) injection 5 mg (5 mg Intravenous Given 11/13/18 1854)  SUMAtriptan (IMITREX) injection 6 mg (6 mg Subcutaneous Given 11/13/18 1853)     Initial Impression / Assessment and Plan / ED Course  I have reviewed the triage vital signs and the nursing notes.  Pertinent labs & imaging results that were available during my care of the patient were reviewed by me and considered in my medical decision making (see chart for details).    Pt HA did not improve while in ED.  Presentation is like pts typical HA and non concerning for [facility], ICH, Meningitis, or temporal arteritis. Pt is afebrile with no  focal neuro deficits, nuchal rigidity, temporal arteritis.  Nontender to palpation of temples. Patient is requesting morphine and Dilaudid for her headache repeatedly.  I discussed with  the patient that narcotics are not indicated for the treatment of migraines. Attempted pain control withTylenol, Phenergan, Benadryl, Haldol, Imitrex.  Unfortunately patient states none of these are working.  On arrival patient's pain was 10 of 10, after reevaluation she states it is 9 out of 10. Pt states her nausea did improve after phenergan and she is tolerating PO intake.  When I walked into the room to check on the patient she is taking off the cardiac monitor stickers and demanding that I take her IV out.  She reports if I do not give her morphine she will not continue to sit in this emergency department.  Recommend she follow-up with PCP and neurology.  Patient also with back pain.  Patient can walk without pain.  No loss of bowel or bladder control.  No concern for cauda equina.  No fever, night sweats, weight loss, h/o cancer, IVDU.  RICE protocol indicated and discussed with patient.   Patient is hemodynamically stable, in NAD, and able to ambulate in the ED. Evaluation does not show pathology that would require ongoing emergent intervention or inpatient treatment. Patient is stable for discharge at this time. All questions were answered prior to disposition. Strict return precautions for returning to the ED were discussed.  Pt case discussed with Dr. Rhunette Croft who agrees with my plan.   This note was prepared with assistance of Conservation officer, historic buildings. Occasional wrong-word or sound-a-like substitutions may have occurred due to the inherent limitations of voice recognition software.       Final Clinical Impressions(s) / ED Diagnoses   Final diagnoses:  Other migraine without status migrainosus, not intractable    ED Discharge Orders    None       Sherene Sires, PA-C 11/13/18  2240    Vianny Schraeder, [place], PA-C 11/13/18 2241    Derwood Kaplan, MD 11/14/18 0107

## 2018-11-13 NOTE — ED Notes (Signed)
Pt states that she wants to leave and started to take monitoring devices off. Provider made aware and provided  Discharge papers.

## 2018-11-13 NOTE — ED Triage Notes (Signed)
Pt reports that she had migraine with n/v and blurred vision starting last night. 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  Pt reports that she has sciatica and other things wrong with her back that normally dont have problems until does things.

## 2018-11-13 NOTE — Discharge Instructions (Signed)
Today you were evaluated at the emergency department for a headache. ° °1. Medications: continue usual home medications °2. Treatment: rest, drink plenty of fluids, if headache persists take 800mmg ibuprofen with caffeine °3. Follow Up: Please followup with your primary doctor in 3 days and neurology within 1 week for discussion of your diagnoses and further evaluation after today's visit; if you do not have a primary care doctor use the resource guide provided to find one; Please return to the ER for double vision, speech difficulty, gait disturbance, persistent vomiting or other concerns. ° °Headache: ° °You are having a headache. No specific cause was found today for your headache. It may have been a migraine or other cause of headache. Stress, anxiety, fatigue, and depression are common triggers for headaches. Your headache today does not appear to be life-threatening or require hospitalization, but often the exact cause of headaches is not determined in the emergency department. Therefore, followup with your doctor is very important to find out what may have caused your headache, and whether or not you need any further diagnostic testing or treatment. Sometimes headaches can appear benign but then more serious symptoms can develop which should prompt an immediate reevaluation by your doctor or the emergency department. ° °Hydration: Have a goal of about a half liter of water every couple hours to stay well hydrated.  ° °Sleep: Please be sure to get plenty of sleep with a goal of 8 hours per night. Having a regular bed time and bedtime routine can help with this. ° °Screens: Reduce the amount of time you are in front of screens.  Take about a 5-10-minute break every hour or every couple hours to give your eyes rest.  Do not use screens in dark rooms.  Glasses with a blue light filter may also help reduce eye fatigue. ° °Stress: Take steps to reduce stress as much as possible.  ° °Seek immediate medical attention  if: ° °You develop possible problems with medications prescribed. °The medications don't resolve your headache, if it recurs, or if you have multiple episodes of vomiting or can't take fluids by mouth °You have a change from the usual headache. °If you developed a sudden severe headache or confusion, become poorly responsive or faint, developed a fever above 100.4 or problems breathing, have a change in speech, vision, swallowing or understanding, or developed new weakness, numbness, tingling, incoordination or have a seizure. ° ° ° °

## 2018-11-20 ENCOUNTER — Emergency Department: Payer: No Typology Code available for payment source

## 2018-11-20 ENCOUNTER — Other Ambulatory Visit: Payer: Self-pay

## 2018-11-20 ENCOUNTER — Emergency Department
Admission: EM | Admit: 2018-11-20 | Discharge: 2018-11-20 | Disposition: A | Discharge status: Home / Self Care | Payer: No Typology Code available for payment source | Attending: Emergency Medicine | Admitting: Emergency Medicine

## 2018-11-20 DIAGNOSIS — Z7984 Long term (current) use of oral hypoglycemic drugs: Secondary | ICD-10-CM | POA: Diagnosis not present

## 2018-11-20 DIAGNOSIS — E119 Type 2 diabetes mellitus without complications: Secondary | ICD-10-CM | POA: Diagnosis not present

## 2018-11-20 DIAGNOSIS — Z7902 Long term (current) use of antithrombotics/antiplatelets: Secondary | ICD-10-CM | POA: Diagnosis not present

## 2018-11-20 DIAGNOSIS — R197 Diarrhea, unspecified: Secondary | ICD-10-CM | POA: Insufficient documentation

## 2018-11-20 DIAGNOSIS — R1084 Generalized abdominal pain: Secondary | ICD-10-CM | POA: Diagnosis present

## 2018-11-20 DIAGNOSIS — I10 Essential (primary) hypertension: Secondary | ICD-10-CM | POA: Diagnosis not present

## 2018-11-20 DIAGNOSIS — N3090 Cystitis, unspecified without hematuria: Secondary | ICD-10-CM | POA: Diagnosis not present

## 2018-11-20 DIAGNOSIS — Z79899 Other long term (current) drug therapy: Secondary | ICD-10-CM | POA: Diagnosis not present

## 2018-11-20 DIAGNOSIS — F17210 Nicotine dependence, cigarettes, uncomplicated: Secondary | ICD-10-CM | POA: Diagnosis not present

## 2018-11-20 LAB — URINALYSIS, COMPLETE (UACMP) WITH MICROSCOPIC
Bacteria, UA: NONE SEEN
Bilirubin Urine: NEGATIVE
Glucose, UA: 50 mg/dL — AB
KETONES UR: NEGATIVE mg/dL
Nitrite: NEGATIVE
Protein, ur: NEGATIVE mg/dL
RBC / HPF: >50 RBC/hpf — ABNORMAL HIGH (ref 0–5)
Specific Gravity, Urine: 1.045 — ABNORMAL HIGH (ref 1.005–1.030)
pH: 5 (ref 5.0–8.0)

## 2018-11-20 LAB — HEPATIC FUNCTION PANEL
ALT: 29 U/L (ref 0–44)
AST: 31 U/L (ref 15–41)
Albumin: 3.7 g/dL (ref 3.5–5.0)
Alkaline Phosphatase: 100 U/L (ref 38–126)
Bilirubin, Direct: <0.1 mg/dL (ref 0.0–0.2)
Total Bilirubin: 0.6 mg/dL (ref 0.3–1.2)
Total Protein: 6.9 g/dL (ref 6.5–8.1)

## 2018-11-20 LAB — CBC
HCT: 41.2 % (ref 36.0–46.0)
Hemoglobin: 13.1 g/dL (ref 12.0–15.0)
MCH: 30 pg (ref 26.0–34.0)
MCHC: 31.8 g/dL (ref 30.0–36.0)
MCV: 94.3 fL (ref 80.0–100.0)
Platelets: 212 10*3/uL (ref 150–400)
RBC: 4.37 MIL/uL (ref 3.87–5.11)
RDW: 14.1 % (ref 11.5–15.5)
WBC: 6.5 10*3/uL (ref 4.0–10.5)
nRBC: 0 % (ref 0.0–0.2)

## 2018-11-20 LAB — GASTROINTESTINAL PANEL BY PCR, STOOL (REPLACES STOOL CULTURE)

## 2018-11-20 LAB — BASIC METABOLIC PANEL
Anion gap: 11 (ref 5–15)
BUN: 21 mg/dL — ABNORMAL HIGH (ref 6–20)
CO2: 21 mmol/L — ABNORMAL LOW (ref 22–32)
Calcium: 8.8 mg/dL — ABNORMAL LOW (ref 8.9–10.3)
Chloride: 105 mmol/L (ref 98–111)
Creatinine, Ser: 0.75 mg/dL (ref 0.44–1.00)
GFR calc Af Amer: >60 mL/min (ref >60)
GFR calc non Af Amer: >60 mL/min (ref >60)
Glucose, Bld: 170 mg/dL — ABNORMAL HIGH (ref 70–99)
POTASSIUM: 4.3 mmol/L (ref 3.5–5.1)
Sodium: 137 mmol/L (ref 135–145)

## 2018-11-20 LAB — LIPASE, BLOOD: Lipase: 30 U/L (ref 11–51)

## 2018-11-20 LAB — C DIFFICILE QUICK SCREEN W PCR REFLEX
C Diff antigen: NEGATIVE
C Diff interpretation: NOT DETECTED
C Diff toxin: NEGATIVE

## 2018-11-20 LAB — MAGNESIUM: Magnesium: 2.1 mg/dL (ref 1.7–2.4)

## 2018-11-20 LAB — GLUCOSE, CAPILLARY: Glucose-Capillary: 182 mg/dL — ABNORMAL HIGH (ref 70–99)

## 2018-11-20 MED ORDER — SODIUM CHLORIDE 0.9 % IV SOLN
1.0000 g | Freq: Once | INTRAVENOUS | Status: AC
  Administered 2018-11-20: 1 g via INTRAVENOUS
  Filled 2018-11-20: qty 10

## 2018-11-20 MED ORDER — SODIUM CHLORIDE 0.9 % IV BOLUS
1000.0000 mL | Freq: Once | INTRAVENOUS | Status: AC
  Administered 2018-11-20: 1000 mL via INTRAVENOUS

## 2018-11-20 MED ORDER — ACETAMINOPHEN 10 MG/ML IV SOLN
1000.0000 mg | Freq: Four times a day (QID) | INTRAVENOUS | Status: DC
  Administered 2018-11-20: 1000 mg via INTRAVENOUS
  Filled 2018-11-20 (×3): qty 100

## 2018-11-20 MED ORDER — CEFDINIR 300 MG PO CAPS: 300.0000 mg | ORAL_CAPSULE | Freq: Two times a day (BID) | ORAL | 0 refills | Status: DC

## 2018-11-20 NOTE — ED Notes (Signed)
Patient verbalized to this nurse that she would allow her husband to visit. Pt denies feeling unsafe around him.

## 2018-11-20 NOTE — ED Notes (Signed)
Pt up to ambulate to the bathroom 

## 2018-11-20 NOTE — ED Notes (Signed)
Pt states she has fallen 6-7 times in past 3 days. Pt is wearing a cardiac monitor placed by a physician at urgent care today. Pt c/o dizziness prior to each time she fell. Pt is not falling every time she is dizzy. Pt is hyperglycemic per report. Pt c/o n/v/d x 4 days. Pt states she only had coffee this morning and vomited after that. Pt has PMH of stroke. Pt states she's been to 3 different medical facilities in the past 5 days and no one has been able to tell her what is wrong. Pt has appt w/ PCP in 1 week.

## 2018-11-20 NOTE — ED Triage Notes (Signed)
Pt states for past few days when she tries to walk she gets swimmy headed and c/o of L kidney pain when walking. States she falls from this. States N&V&D as well.   A&O, in wheelchair. Speaking in complete sentences. Moving extremities on own.

## 2018-11-20 NOTE — ED Notes (Signed)
Pt to front desk to ask about wait time. Pt updated to the best of this nurses ability. Pt remains calm and sitting in wheelchair. NAD noted and no changes in symptoms reported.

## 2018-11-20 NOTE — ED Notes (Signed)
Report received from [place], [place].Marland Kitchen Introduced to pt. Call bell within reach. Requesting something for pain, MD aware, awaits IV Tylenol from Pharmacy.

## 2018-11-20 NOTE — ED Notes (Signed)
Pt refused to allow RN to review d/c paperwork and f/u instructions. Refused to allow Rn to take d/c vitals and refused to sign e-signature. Pt verbalized that she was upset that we did not address her pain appropriately and proceeded to storm out of the room. IV was removed by Rn prior to pt exiting the department. Pt did take rx and dispo paperwork with her.

## 2018-11-20 NOTE — Discharge Instructions (Signed)
Your lab tests and CT scan today are okay.  The stool tests are also negative for any infectious pathogens.  The urine test does look like you still have a urinary tract infection so I am prescribing a new antibiotic to help control this.  Please follow-up closely with your primary care doctor for continued evaluation of your symptoms.

## 2018-11-20 NOTE — ED Provider Notes (Signed)
[facility] Emergency Department Provider Note  ____________________________________________  Time seen: Approximately 8:50 PM  I have reviewed the triage vital signs and the nursing notes.   HISTORY  Chief Complaint Dizziness; Fall; and Flank Pain    Level 5 Caveat: Portions of the History and Physical including HPI and review of systems are unable to be completely obtained due to patient being a poor historian   HPI Stephanie [name] is a 53 y.o. female with a history of diabetes hepatitis hypertension IBS and stroke  who complains of generalized abdominal pain  and diarrhea for the past 4 days.  Also reports vomiting.  Denies body aches fevers or chills.  States she has been on antibiotics for the past few days due to urinary tract infection.  Notes that she has been to 3 or 4 different healthcare facilities in the past several days without any definitive diagnosis of her symptoms.  Request pain medicine.  Notably her chart lists an allergy for every pain medicine except for opiates.  She denies chest pain or shortness of breath.     Past Medical History:  Diagnosis Date  . Diabetes mellitus without complication (HCC)   . Difficult intubation   . Diverticulitis   . Headache   . Hepatitis C    treated and resolved  . History of kidney stones   . Hypertension   . IBS (irritable bowel syndrome)   . Kidney stones   . Sciatica   . Sleep apnea    on cpap  . Stroke [facility]) 2017   memory loss     Patient Active Problem List   Diagnosis Date Noted  . Hypotension 08/22/2018  . Bradycardia 08/22/2018  . Type 2 diabetes mellitus (HCC) 08/22/2018  . Acute encephalopathy 08/21/2018  . Shoulder pain, right 12/22/2017  . UTI (urinary tract infection) 08/21/2017  . Nausea & vomiting 08/21/2017  . Nicotine dependence 08/21/2017  . Headache 01/07/2017  . Small bowel obstruction (HCC) 09/02/2016  . Pain and swelling of left upper extremity 07/22/2016  .  Superficial venous thrombosis of arm, left 07/22/2016  . ICH (intracerebral hemorrhage) (HCC)   . Essential hypertension, malignant 04/19/2016  . Cytotoxic brain edema (HCC) 04/19/2016  . IVH (intraventricular hemorrhage) (HCC) 04/18/2016  . Hepatitis C      Past Surgical History:  Procedure Laterality Date  . ABDOMINAL HYSTERECTOMY    . carpel tunnel syndrome  2017  . HARDWARE REMOVAL Right 12/22/2017   Procedure: HARDWARE REMOVAL-RIGHT ARM;  Surgeon: Lyndle Herrlich, MD;  Location: [facility] ORS;  Service: Orthopedics;  Laterality: Right;  Removal of implant, superficial right humerus  . HUMERUS IM NAIL Right 03/11/2017   Procedure: INTRAMEDULLARY (IM) NAIL HUMERAL;  Surgeon: Lyndle Herrlich, MD;  Location: [facility] ORS;  Service: Orthopedics;  Laterality: Right;  . KIDNEY STONE SURGERY Right 02/12/12  . KIDNEY STONE SURGERY Left 07/15/2012  . LIGATION OF ARTERIOVENOUS  FISTULA Left 06/21/2016   Procedure: LIGATION OF ARTERIOVENOUS  FISTULA ( LIGATION BASILIC VEIN );  Surgeon: Renford Dills, MD;  Location: [facility] ORS;  Service: Vascular;  Laterality: Left;  . LITHOTRIPSY    . OOPHORECTOMY    . SINUS EXPLORATION    . TONSILLECTOMY       Prior to Admission medications   Medication Sig Start Date End Date Taking? Authorizing Provider  alprazolam Prudy Feeler) 2 MG tablet Take 2 mg by mouth 2 (two) times daily as needed for anxiety. 10/13/18  Yes [provider]  aspirin-acetaminophen-caffeine (EXCEDRIN MIGRAINE) 250-250-65 MG tablet Take 1 tablet by mouth every 6 (six) hours as needed for headache.   Yes [provider]  cephALEXin (KEFLEX) 500 MG capsule Take 500 mg by mouth 4 (four) times daily. 11/14/18 11/24/18 Yes [provider]  cloNIDine (CATAPRES) 0.2 MG tablet Take 0.2 mg by mouth 2 (two) times daily. 10/02/18  Yes [provider]  clopidogrel (PLAVIX) 75 MG tablet Take 1 tablet (75 mg total) by mouth daily. 06/27/16  Yes Marvel Plan, MD  FLUoxetine (PROZAC) 40  MG capsule Take 40 mg by mouth daily.  06/28/18  Yes [provider]  Galcanezumab-gnlm (EMGALITY) 120 MG/ML SOSY Inject 120 mg into the skin every 30 (thirty) days.   Yes [provider]  glipiZIDE (GLUCOTROL) 5 MG tablet Take 1 tablet (5 mg total) by mouth 2 (two) times daily before a meal. 04/30/16  Yes Lora Paula, MD  hydrALAZINE (APRESOLINE) 25 MG tablet Take 25 mg by mouth 3 (three) times daily. 10/23/18  Yes [provider]  ketorolac (TORADOL) 10 MG tablet Take 10 mg by mouth every 6 (six) hours as needed.   Yes [provider]  loratadine (CLARITIN) 10 MG tablet Take 10 mg by mouth daily.   Yes [provider]  losartan (COZAAR) 100 MG tablet Take 100 mg by mouth daily. 10/02/18  Yes [provider]  metoprolol succinate (TOPROL-XL) 50 MG 24 hr tablet Take 50 mg by mouth daily. 06/03/18  Yes [provider]  omeprazole (PRILOSEC) 40 MG capsule Take 40 mg by mouth daily.   Yes [provider]  SYMBICORT 160-4.5 MCG/ACT inhaler Inhale 2 puffs into the lungs 2 (two) times daily. 09/08/18  Yes [provider]  zolpidem (AMBIEN) 10 MG tablet Take 10 mg by mouth at bedtime as needed for sleep. 10/13/18  Yes [provider]  cefdinir (OMNICEF) 300 MG capsule Take 1 capsule (300 mg total) by mouth 2 (two) times daily. 11/20/18   Sharman Cheek, MD     Allergies Gabapentin; Tylenol [acetaminophen]; Aspirin; Buprenorphine hcl; Codeine; Compazine; Ioxaglate; Ivp dye [iodinated diagnostic agents]; Metrizamide; Naproxen; Norco [hydrocodone-acetaminophen]; Penicillin g; Penicillins; Prochlorperazine maleate; Reglan [metoclopramide]; Sulfur; Tegretol [carbamazepine]; Toradol [ketorolac tromethamine]; Tramadol; and Zofran [ondansetron hcl]   Family History  Problem Relation Age of Onset  . Heart failure Mother   . Diabetes Father   . Cancer Sister     Social History Social History   Tobacco Use  .  Smoking status: Current Every Day Smoker    Packs/day: 1.00    Years: 1.50    Pack years: 1.50    Types: Cigarettes  . Smokeless tobacco: Never Used  Substance Use Topics  . Alcohol use: Not Currently    Comment: no alcohol since 2002  . Drug use: No    Comment: electric cig    Review of Systems Level 5 Caveat: Portions of the History and Physical including HPI and review of systems are unable to be completely obtained due to patient being a poor historian   Constitutional:   No known fever.  ENT:   No rhinorrhea. Cardiovascular:   No chest pain or syncope. Respiratory:   No dyspnea or cough. Gastrointestinal: Positive as above  for abdominal pain, vomiting and diarrhea.  Musculoskeletal:   Negative for focal pain or swelling ____________________________________________   PHYSICAL EXAM:  VITAL SIGNS: ED Triage Vitals [11/20/18 1403]  Enc Vitals Group     BP (!) 121/101  Pulse Rate 92     Resp 16     Temp 97.8 F (36.6 C)     Temp Source Oral     SpO2 95 %     Weight 180 lb (81.6 kg)     Height  (1.575 m)     Head Circumference      Peak Flow      Pain Score 10     Pain Loc      Pain Edu?      Excl. in GC?     Vital signs reviewed, nursing assessments reviewed.   Constitutional:   Alert and oriented. Non-toxic appearance. Eyes:   Conjunctivae are normal. EOMI. PERRL. ENT      Head:   Normocephalic and atraumatic.      Nose:   No congestion/rhinnorhea.       Mouth/Throat:   Dry mucous membranes, no pharyngeal erythema. No peritonsillar mass.       Neck:   No meningismus. Full ROM. Hematological/Lymphatic/Immunilogical:   No cervical lymphadenopathy. Cardiovascular:   RRR. Symmetric bilateral radial and DP pulses.  No murmurs. Cap refill less than 2 seconds. Respiratory:   Normal respiratory effort without tachypnea/retractions. Breath sounds are clear and equal bilaterally. No wheezes/rales/rhonchi. Gastrointestinal:   Soft with generalized  tenderness, nonfocal. Non distended. There is no CVA tenderness.  No rebound, rigidity, or guarding. Musculoskeletal:   Normal range of motion in all extremities. No joint effusions.  No lower extremity tenderness.  No edema. Neurologic:   Normal speech and language.  Motor grossly intact. No acute focal neurologic deficits are appreciated.  Skin:    Skin is warm, dry and intact. No rash noted.  No petechiae, purpura, or bullae.  ____________________________________________    LABS (pertinent positives/negatives) (all labs ordered are listed, but only abnormal results are displayed) Labs Reviewed  BASIC METABOLIC PANEL - Abnormal; Notable for the following components:      Result Value   CO2 21 (*)    Glucose, Bld 170 (*)    BUN 21 (*)    Calcium 8.8 (*)    All other components within normal limits  URINALYSIS, COMPLETE (UACMP) WITH MICROSCOPIC - Abnormal; Notable for the following components:   Color, Urine YELLOW (*)    APPearance CLOUDY (*)    Specific Gravity, Urine 1.045 (*)    Glucose, UA 50 (*)    Hgb urine dipstick LARGE (*)    Leukocytes,Ua LARGE (*)    RBC / HPF >50 (*)    WBC, UA >50 (*)    All other components within normal limits  GLUCOSE, CAPILLARY - Abnormal; Notable for the following components:   Glucose-Capillary 182 (*)    All other components within normal limits  GASTROINTESTINAL PANEL BY PCR, STOOL (REPLACES STOOL CULTURE)  C DIFFICILE QUICK SCREEN W PCR REFLEX  URINE CULTURE  CBC  HEPATIC FUNCTION PANEL  LIPASE, BLOOD  MAGNESIUM  CBG MONITORING, ED   ____________________________________________   EKG    ____________________________________________    RADIOLOGY  Ct Abdomen Pelvis Wo Contrast  Result Date: 11/20/2018 CLINICAL DATA:  Nausea, vomiting, diarrhea, and generalized abdominal pain for 4 days. EXAM: CT ABDOMEN AND PELVIS WITHOUT CONTRAST TECHNIQUE: Multidetector CT imaging of the abdomen and pelvis was performed following the  standard protocol without IV contrast. COMPARISON:  11/18/2018 FINDINGS: Lower chest: Lung bases are clear. Hepatobiliary: No focal liver lesions. Increased density in the gallbladder likely represents vicarious excretion of contrast material period. No gallbladder wall thickening  or edema. No bile duct dilatation. Pancreas: Unremarkable. No pancreatic ductal dilatation or surrounding inflammatory changes. Spleen: Normal in size without focal abnormality. Adrenals/Urinary Tract: No adrenal gland nodules. Multiple stones in the right kidney and a single stone in the left kidney. Largest right renal stone measures 4.5 mm in diameter. Residual contrast material in the bladder, renal collecting system and ureters. Parenchymal scarring on both kidneys, greater on the right. No hydronephrosis or hydroureter. Bladder wall is not thickened and no filling defects are demonstrated. Stomach/Bowel: Stomach, small bowel, and colon are mostly decompressed with scattered stool in the colon. No wall thickening or inflammatory changes. Scattered diverticula in the sigmoid colon without evidence of diverticulitis. Appendix is normal. Vascular/Lymphatic: Aortic atherosclerosis. No enlarged abdominal or pelvic lymph nodes. Reproductive: Status post hysterectomy. No adnexal masses. Other: No free air or free fluid in the abdomen. Abdominal wall musculature appears intact. Musculoskeletal: Degenerative changes in the spine. No destructive bone lesions. IMPRESSION: Bilateral nonobstructing intrarenal stones. Scarring in both kidneys. No hydronephrosis or hydroureter. No evidence of bowel obstruction or inflammation. Electronically Signed   By: Burman Nieves M.D.   On: 11/20/2018 20:38    ____________________________________________   PROCEDURES Procedures  ____________________________________________  DIFFERENTIAL DIAGNOSIS   Bowel obstruction, gastroenteritis, perforation, cystitis, diverticulitis, C. difficile  infection  CLINICAL IMPRESSION / ASSESSMENT AND PLAN / ED COURSE  Pertinent labs & imaging results that were available during my care of the patient were reviewed by me and considered in my medical decision making (see chart for details).    Patient presents with diarrhea and abdominal pain.  Represents significant tenderness on exam although it is nonfocal.  CT scan unremarkable, labs unremarkable, urinalysis consistent with persistent UTI despite taking Keflex recently.  I will step up her antibiotics to [facility] after getting ceftriaxone in the ED.  Patient given IV fluids for hydration.  Controlled substance reporting system shows that she has extensive benzodiazepine use.  It does not list a lot of opioid use, but her symptoms could possibly be related to opioid withdrawal if she is obtaining them from another source.  Appears to be stable I doubt AAA, dissection, mesenteric ischemia.  Doubt STI PID TOA or torsion.  She is suitable for outpatient follow-up.      ____________________________________________   FINAL CLINICAL IMPRESSION(S) / ED DIAGNOSES    Final diagnoses:  Cystitis  Generalized abdominal pain  Diarrhea, unspecified type     ED Discharge Orders         Ordered    cefdinir (OMNICEF) 300 MG capsule  2 times daily     11/20/18 2048          Portions of this note were generated with dragon dictation software. Dictation errors may occur despite best attempts at proofreading.   Sharman Cheek, MD 11/20/18 2056

## 2018-11-22 ENCOUNTER — Other Ambulatory Visit: Payer: Self-pay

## 2018-11-22 ENCOUNTER — Emergency Department (HOSPITAL_COMMUNITY)
Admission: EM | Admit: 2018-11-22 | Discharge: 2018-11-22 | Disposition: A | Discharge status: Home / Self Care | Payer: No Typology Code available for payment source | Attending: Emergency Medicine | Admitting: Emergency Medicine

## 2018-11-22 ENCOUNTER — Encounter (HOSPITAL_COMMUNITY): Payer: Self-pay

## 2018-11-22 DIAGNOSIS — E119 Type 2 diabetes mellitus without complications: Secondary | ICD-10-CM | POA: Insufficient documentation

## 2018-11-22 DIAGNOSIS — N390 Urinary tract infection, site not specified: Secondary | ICD-10-CM | POA: Insufficient documentation

## 2018-11-22 DIAGNOSIS — Z79899 Other long term (current) drug therapy: Secondary | ICD-10-CM | POA: Insufficient documentation

## 2018-11-22 DIAGNOSIS — R1084 Generalized abdominal pain: Secondary | ICD-10-CM | POA: Diagnosis present

## 2018-11-22 DIAGNOSIS — Z7984 Long term (current) use of oral hypoglycemic drugs: Secondary | ICD-10-CM | POA: Insufficient documentation

## 2018-11-22 DIAGNOSIS — F17210 Nicotine dependence, cigarettes, uncomplicated: Secondary | ICD-10-CM | POA: Diagnosis not present

## 2018-11-22 DIAGNOSIS — I10 Essential (primary) hypertension: Secondary | ICD-10-CM | POA: Diagnosis not present

## 2018-11-22 DIAGNOSIS — R109 Unspecified abdominal pain: Secondary | ICD-10-CM

## 2018-11-22 LAB — URINALYSIS, ROUTINE W REFLEX MICROSCOPIC
Bacteria, UA: NONE SEEN
Bilirubin Urine: NEGATIVE
GLUCOSE, UA: NEGATIVE mg/dL
Ketones, ur: NEGATIVE mg/dL
NITRITE: POSITIVE — AB
Protein, ur: NEGATIVE mg/dL
Specific Gravity, Urine: 1.005 (ref 1.005–1.030)
WBC, UA: >50 WBC/hpf — ABNORMAL HIGH (ref 0–5)
pH: 6 (ref 5.0–8.0)

## 2018-11-22 LAB — CBC WITH DIFFERENTIAL/PLATELET
Abs Immature Granulocytes: 0.03 10*3/uL (ref 0.00–0.07)
Basophils Absolute: 0 10*3/uL (ref 0.0–0.1)
Basophils Relative: 1 %
EOS ABS: 0.3 10*3/uL (ref 0.0–0.5)
Eosinophils Relative: 5 %
HCT: 40.1 % (ref 36.0–46.0)
Hemoglobin: 12.6 g/dL (ref 12.0–15.0)
Immature Granulocytes: 0 %
Lymphocytes Relative: 31 %
Lymphs Abs: 2.1 10*3/uL (ref 0.7–4.0)
MCH: 29.9 pg (ref 26.0–34.0)
MCHC: 31.4 g/dL (ref 30.0–36.0)
MCV: 95.2 fL (ref 80.0–100.0)
Monocytes Absolute: 0.6 10*3/uL (ref 0.1–1.0)
Monocytes Relative: 9 %
Neutro Abs: 3.7 10*3/uL (ref 1.7–7.7)
Neutrophils Relative %: 54 %
Platelets: 202 10*3/uL (ref 150–400)
RBC: 4.21 MIL/uL (ref 3.87–5.11)
RDW: 14.3 % (ref 11.5–15.5)
WBC: 6.7 10*3/uL (ref 4.0–10.5)
nRBC: 0 % (ref 0.0–0.2)

## 2018-11-22 LAB — COMPREHENSIVE METABOLIC PANEL
ALT: 32 U/L (ref 0–44)
ANION GAP: 6 (ref 5–15)
AST: 26 U/L (ref 15–41)
Albumin: 3.5 g/dL (ref 3.5–5.0)
Alkaline Phosphatase: 101 U/L (ref 38–126)
BUN: 26 mg/dL — ABNORMAL HIGH (ref 6–20)
CO2: 25 mmol/L (ref 22–32)
Calcium: 9.1 mg/dL (ref 8.9–10.3)
Chloride: 108 mmol/L (ref 98–111)
Creatinine, Ser: 0.8 mg/dL (ref 0.44–1.00)
GFR calc Af Amer: >60 mL/min (ref >60)
GFR calc non Af Amer: >60 mL/min (ref >60)
Glucose, Bld: 116 mg/dL — ABNORMAL HIGH (ref 70–99)
Potassium: 4.1 mmol/L (ref 3.5–5.1)
Sodium: 139 mmol/L (ref 135–145)
Total Bilirubin: 0.5 mg/dL (ref 0.3–1.2)
Total Protein: 6.8 g/dL (ref 6.5–8.1)

## 2018-11-22 LAB — URINE CULTURE

## 2018-11-22 LAB — LIPASE, BLOOD: Lipase: 37 U/L (ref 11–51)

## 2018-11-22 MED ORDER — MORPHINE SULFATE (PF) 4 MG/ML IV SOLN
4.0000 mg | Freq: Once | INTRAVENOUS | Status: AC
  Administered 2018-11-22: 4 mg via INTRAVENOUS
  Filled 2018-11-22: qty 1

## 2018-11-22 MED ORDER — SODIUM CHLORIDE 0.9 % IV BOLUS
1000.0000 mL | Freq: Once | INTRAVENOUS | Status: AC
  Administered 2018-11-22: 1000 mL via INTRAVENOUS

## 2018-11-22 MED ORDER — SODIUM CHLORIDE 0.9 % IV SOLN
1.0000 g | Freq: Once | INTRAVENOUS | Status: AC
  Administered 2018-11-22: 1 g via INTRAVENOUS
  Filled 2018-11-22: qty 10

## 2018-11-22 MED ORDER — PROMETHAZINE HCL 25 MG/ML IJ SOLN
25.0000 mg | Freq: Once | INTRAMUSCULAR | Status: AC
  Administered 2018-11-22: 25 mg via INTRAVENOUS
  Filled 2018-11-22: qty 1

## 2018-11-22 NOTE — ED Notes (Signed)
Pt called with no response x1

## 2018-11-22 NOTE — ED Triage Notes (Signed)
Patient arrived via POV, AOx4 and ambulatory. Patient chief complaint is generalized abdominal pain, diarrhea and back pain primarily on left lower lumbar area. Abd pain has been going on for about 8 days. Patient stated she has had about 6-7 episodes of diarrhea yesterday and about 4 today.

## 2018-11-22 NOTE — ED Provider Notes (Signed)
[facility]-EMERGENCY DEPT Provider Note   CSN: [id number] Arrival date & time: 11/22/18  1220    History   Chief Complaint Chief Complaint  Patient presents with  . Abdominal Pain  . Diarrhea    HPI Stephanie [name] is a 53 y.o. female.  53 year old female with history of diabetes and IBS chronic headaches kidney stones who has had multiple recent ED visits for headaches and abdominal pain back pain urinary symptoms.  Currently she is on antibiotics.  She is complaining worsening diarrhea that is been going on over a week, diffuse abdominal pain, left-sided back pain.  She rates the pain is severe.  She denies any fevers or chills.  She was last at [facility] 2 days ago for same.  She had a CT abdomen and pelvis without contrast at that time that did not show any acute findings, did see some nonobstructing intrarenal stones and some kidney scarring.He says she has upcoming appointments with her GI and urology specialist.     The history is provided by the patient.  Abdominal Pain  Pain location:  Generalized Pain quality: sharp and stabbing   Pain severity:  Severe Onset quality:  Gradual Timing:  Constant Progression:  Waxing and waning Chronicity:  Recurrent Context: recent illness   Context: not recent travel, not suspicious food intake and not trauma   Relieved by:  Nothing Worsened by:  Nothing Ineffective treatments:  OTC medications Associated symptoms: diarrhea and dysuria   Associated symptoms: no chest pain, no chills, no constipation, no cough, no fever, no shortness of breath and no sore throat   Risk factors: multiple surgeries   Diarrhea  Associated symptoms: abdominal pain and headaches   Associated symptoms: no chills and no fever     Past Medical History:  Diagnosis Date  . Diabetes mellitus without complication (HCC)   . Difficult intubation   . Diverticulitis   . Headache   . Hepatitis C    treated and resolved  . History of  kidney stones   . Hypertension   . IBS (irritable bowel syndrome)   . Kidney stones   . Sciatica   . Sleep apnea    on cpap  . Stroke [facility]) 2017   memory loss    Patient Active Problem List   Diagnosis Date Noted  . Hypotension 08/22/2018  . Bradycardia 08/22/2018  . Type 2 diabetes mellitus (HCC) 08/22/2018  . Acute encephalopathy 08/21/2018  . Shoulder pain, right 12/22/2017  . UTI (urinary tract infection) 08/21/2017  . Nausea & vomiting 08/21/2017  . Nicotine dependence 08/21/2017  . Headache 01/07/2017  . Small bowel obstruction (HCC) 09/02/2016  . Pain and swelling of left upper extremity 07/22/2016  . Superficial venous thrombosis of arm, left 07/22/2016  . ICH (intracerebral hemorrhage) (HCC)   . Essential hypertension, malignant 04/19/2016  . Cytotoxic brain edema (HCC) 04/19/2016  . IVH (intraventricular hemorrhage) (HCC) 04/18/2016  . Hepatitis C     Past Surgical History:  Procedure Laterality Date  . ABDOMINAL HYSTERECTOMY    . carpel tunnel syndrome  2017  . HARDWARE REMOVAL Right 12/22/2017   Procedure: HARDWARE REMOVAL-RIGHT ARM;  Surgeon: Lyndle Herrlich, MD;  Location: [facility] ORS;  Service: Orthopedics;  Laterality: Right;  Removal of implant, superficial right humerus  . HUMERUS IM NAIL Right 03/11/2017   Procedure: INTRAMEDULLARY (IM) NAIL HUMERAL;  Surgeon: Lyndle Herrlich, MD;  Location: [facility] ORS;  Service: Orthopedics;  Laterality: Right;  . KIDNEY STONE SURGERY  Right 02/12/12  . KIDNEY STONE SURGERY Left 07/15/2012  . LIGATION OF ARTERIOVENOUS  FISTULA Left 06/21/2016   Procedure: LIGATION OF ARTERIOVENOUS  FISTULA ( LIGATION BASILIC VEIN );  Surgeon: Renford Dills, MD;  Location: [facility] ORS;  Service: Vascular;  Laterality: Left;  . LITHOTRIPSY    . OOPHORECTOMY    . SINUS EXPLORATION    . TONSILLECTOMY       OB History   No obstetric history on file.      Home Medications    Prior to Admission medications   Medication Sig Start Date End  Date Taking? Authorizing Provider  alprazolam Prudy Feeler) 2 MG tablet Take 2 mg by mouth 2 (two) times daily as needed for anxiety. 10/13/18   [provider]  aspirin-acetaminophen-caffeine (EXCEDRIN MIGRAINE) [phone] MG tablet Take 1 tablet by mouth every 6 (six) hours as needed for headache.    [provider]  cefdinir (OMNICEF) 300 MG capsule Take 1 capsule (300 mg total) by mouth 2 (two) times daily. 11/20/18   Sharman Cheek, MD  cephALEXin (KEFLEX) 500 MG capsule Take 500 mg by mouth 4 (four) times daily. 11/14/18 11/24/18  [provider]  cloNIDine (CATAPRES) 0.2 MG tablet Take 0.2 mg by mouth 2 (two) times daily. 10/02/18   [provider]  clopidogrel (PLAVIX) 75 MG tablet Take 1 tablet (75 mg total) by mouth daily. 06/27/16   Marvel Plan, MD  FLUoxetine (PROZAC) 40 MG capsule Take 40 mg by mouth daily.  06/28/18   [provider]  Galcanezumab-gnlm (EMGALITY) 120 MG/ML SOSY Inject 120 mg into the skin every 30 (thirty) days.    [provider]  glipiZIDE (GLUCOTROL) 5 MG tablet Take 1 tablet (5 mg total) by mouth 2 (two) times daily before a meal. 04/30/16   Lora Paula, MD  hydrALAZINE (APRESOLINE) 25 MG tablet Take 25 mg by mouth 3 (three) times daily. 10/23/18   [provider]  ketorolac (TORADOL) 10 MG tablet Take 10 mg by mouth every 6 (six) hours as needed.    [provider]  loratadine (CLARITIN) 10 MG tablet Take 10 mg by mouth daily.    [provider]  losartan (COZAAR) 100 MG tablet Take 100 mg by mouth daily. 10/02/18   [provider]  metoprolol succinate (TOPROL-XL) 50 MG 24 hr tablet Take 50 mg by mouth daily. 06/03/18   [provider]  omeprazole (PRILOSEC) 40 MG capsule Take 40 mg by mouth daily.    [provider]  SYMBICORT 160-4.5 MCG/ACT inhaler Inhale 2 puffs into the lungs 2 (two) times daily. 09/08/18   [provider]  zolpidem (AMBIEN) 10 MG  tablet Take 10 mg by mouth at bedtime as needed for sleep. 10/13/18   [provider]    Family History Family History  Problem Relation Age of Onset  . Heart failure Mother   . Diabetes Father   . Cancer Sister     Social History Social History   Tobacco Use  . Smoking status: Current Every Day Smoker    Packs/day: 1.00    Years: 1.50    Pack years: 1.50    Types: Cigarettes  . Smokeless tobacco: Never Used  Substance Use Topics  . Alcohol use: Not Currently    Comment: no alcohol since 2002  . Drug use: No    Comment: electric cig     Allergies   Gabapentin; Tylenol [acetaminophen]; Aspirin; Buprenorphine hcl; Codeine; Compazine; Ioxaglate; Ivp dye [  iodinated diagnostic agents]; Metrizamide; Naproxen; Norco [hydrocodone-acetaminophen]; Penicillin g; Penicillins; Prochlorperazine maleate; Reglan [metoclopramide]; Sulfur; Tegretol [carbamazepine]; Toradol [ketorolac tromethamine]; Tramadol; and Zofran [ondansetron hcl]   Review of Systems Review of Systems  Constitutional: Negative for chills and fever.  HENT: Negative for sore throat.   Eyes: Negative for visual disturbance.  Respiratory: Negative for cough and shortness of breath.   Cardiovascular: Negative for chest pain.  Gastrointestinal: Positive for abdominal pain and diarrhea. Negative for constipation.  Genitourinary: Positive for dysuria.  Musculoskeletal: Positive for back pain.  Skin: Negative for rash.  Neurological: Positive for headaches.     Physical Exam Updated Vital Signs BP (!) 173/105 (BP Location: Left Arm)   Pulse 83   Temp 97.7 F (36.5 C) (Oral)   Resp 18   Ht  (1.575 m)   Wt 81.6 kg   SpO2 99%   BMI 32.92 kg/m   Physical Exam Vitals signs and nursing note reviewed.  Constitutional:      General: She is not in acute distress.    Appearance: She is well-developed. She is obese.  HENT:     Head: Normocephalic and atraumatic.  Eyes:     Conjunctiva/sclera:  Conjunctivae normal.  Neck:     Musculoskeletal: Neck supple.  Cardiovascular:     Rate and Rhythm: Normal rate and regular rhythm.     Heart sounds: No murmur.  Pulmonary:     Effort: Pulmonary effort is normal. No respiratory distress.     Breath sounds: Normal breath sounds.  Abdominal:     Palpations: Abdomen is soft.     Tenderness: There is generalized abdominal tenderness. There is no guarding or rebound.  Musculoskeletal: Normal range of motion.        General: No deformity or signs of injury.  Skin:    General: Skin is warm and dry.  Neurological:     General: No focal deficit present.     Mental Status: She is alert.      ED Treatments / Results  Labs (all labs ordered are listed, but only abnormal results are displayed) Labs Reviewed  COMPREHENSIVE METABOLIC PANEL - Abnormal; Notable for the following components:      Result Value   Glucose, Bld 116 (*)    BUN 26 (*)    All other components within normal limits  URINALYSIS, ROUTINE W REFLEX MICROSCOPIC - Abnormal; Notable for the following components:   Color, Urine AMBER (*)    Hgb urine dipstick SMALL (*)    Nitrite POSITIVE (*)    Leukocytes,Ua LARGE (*)    WBC, UA >50 (*)    All other components within normal limits  GASTROINTESTINAL PANEL BY PCR, STOOL (REPLACES STOOL CULTURE)  CBC WITH DIFFERENTIAL/PLATELET  LIPASE, BLOOD    EKG None  Radiology Ct Abdomen Pelvis Wo Contrast  Result Date: 11/20/2018 CLINICAL DATA:  Nausea, vomiting, diarrhea, and generalized abdominal pain for 4 days. EXAM: CT ABDOMEN AND PELVIS WITHOUT CONTRAST TECHNIQUE: Multidetector CT imaging of the abdomen and pelvis was performed following the standard protocol without IV contrast. COMPARISON:  11/18/2018 FINDINGS: Lower chest: Lung bases are clear. Hepatobiliary: No focal liver lesions. Increased density in the gallbladder likely represents vicarious excretion of contrast material period. No gallbladder wall thickening or  edema. No bile duct dilatation. Pancreas: Unremarkable. No pancreatic ductal dilatation or surrounding inflammatory changes. Spleen: Normal in size without focal abnormality. Adrenals/Urinary Tract: No adrenal gland nodules. Multiple stones in the right kidney and a single stone in  the left kidney. Largest right renal stone measures 4.5 mm in diameter. Residual contrast material in the bladder, renal collecting system and ureters. Parenchymal scarring on both kidneys, greater on the right. No hydronephrosis or hydroureter. Bladder wall is not thickened and no filling defects are demonstrated. Stomach/Bowel: Stomach, small bowel, and colon are mostly decompressed with scattered stool in the colon. No wall thickening or inflammatory changes. Scattered diverticula in the sigmoid colon without evidence of diverticulitis. Appendix is normal. Vascular/Lymphatic: Aortic atherosclerosis. No enlarged abdominal or pelvic lymph nodes. Reproductive: Status post hysterectomy. No adnexal masses. Other: No free air or free fluid in the abdomen. Abdominal wall musculature appears intact. Musculoskeletal: Degenerative changes in the spine. No destructive bone lesions. IMPRESSION: Bilateral nonobstructing intrarenal stones. Scarring in both kidneys. No hydronephrosis or hydroureter. No evidence of bowel obstruction or inflammation. Electronically Signed   By: Burman Nieves M.D.   On: 11/20/2018 20:38    Procedures Procedures (including critical care time)  Medications Ordered in ED Medications  morphine 4 MG/ML injection 4 mg (has no administration in time range)  sodium chloride 0.9 % bolus 1,000 mL (has no administration in time range)  promethazine (PHENERGAN) injection 25 mg (has no administration in time range)     Initial Impression / Assessment and Plan / ED Course  I have reviewed the triage vital signs and the nursing notes.  Pertinent labs & imaging results that were available during my care of the  patient were reviewed by me and considered in my medical decision making (see chart for details).  Clinical Course as of Nov 23 650  Wynelle Link Nov 22, 2018  1339 Patient had a CT yesterday that did not show any acute findings.  I do not see a new indication for rescanning her.  On review of prior ED notes this sounds very chronic in the each, and upon her request for pain medicine.   [MB]  1524 Patient's work-up has been fairly unremarkable.  She does have a UTI and is on antibiotics for this.  I explained that she will need to continue the antibiotics.  She understands the need for her to follow-up with her specialists including urology and gastroenterology.   [MB]    Clinical Course User Index [MB] Terrilee Files, MD        Final Clinical Impressions(s) / ED Diagnoses   Final diagnoses:  Generalized abdominal pain  Left flank pain  Lower urinary tract infectious disease    ED Discharge Orders    None       Terrilee Files, MD 11/23/18 [phone]

## 2018-11-22 NOTE — Discharge Instructions (Signed)
You were seen in the emergency department for abdominal and left flank pain.  Your lab work did not show an obvious explanation for your pain although your urine infection does not look like it is been completely treated.  We gave you some pain medicine and IV fluids and an IV dose of antibiotics.  Please finish your oral antibiotics and follow-up with your urologist and your GI doctor.

## 2019-01-07 ENCOUNTER — Other Ambulatory Visit: Payer: Self-pay

## 2019-01-07 ENCOUNTER — Emergency Department (HOSPITAL_COMMUNITY)
Admission: EM | Admit: 2019-01-07 | Discharge: 2019-01-07 | Disposition: A | Discharge status: Home / Self Care | Payer: No Typology Code available for payment source | Attending: Emergency Medicine | Admitting: Emergency Medicine

## 2019-01-07 DIAGNOSIS — Z79899 Other long term (current) drug therapy: Secondary | ICD-10-CM | POA: Insufficient documentation

## 2019-01-07 DIAGNOSIS — E119 Type 2 diabetes mellitus without complications: Secondary | ICD-10-CM | POA: Diagnosis not present

## 2019-01-07 DIAGNOSIS — F17210 Nicotine dependence, cigarettes, uncomplicated: Secondary | ICD-10-CM | POA: Diagnosis not present

## 2019-01-07 DIAGNOSIS — R51 Headache: Secondary | ICD-10-CM | POA: Insufficient documentation

## 2019-01-07 DIAGNOSIS — Z7984 Long term (current) use of oral hypoglycemic drugs: Secondary | ICD-10-CM | POA: Insufficient documentation

## 2019-01-07 DIAGNOSIS — R519 Headache, unspecified: Secondary | ICD-10-CM

## 2019-01-07 DIAGNOSIS — I10 Essential (primary) hypertension: Secondary | ICD-10-CM | POA: Insufficient documentation

## 2019-01-07 DIAGNOSIS — Z7902 Long term (current) use of antithrombotics/antiplatelets: Secondary | ICD-10-CM | POA: Insufficient documentation

## 2019-01-07 LAB — COMPREHENSIVE METABOLIC PANEL
ALT: 16 U/L (ref 0–44)
AST: 16 U/L (ref 15–41)
Albumin: 3.8 g/dL (ref 3.5–5.0)
Alkaline Phosphatase: 90 U/L (ref 38–126)
Anion gap: 8 (ref 5–15)
BUN: 12 mg/dL (ref 6–20)
CO2: 24 mmol/L (ref 22–32)
Calcium: 9 mg/dL (ref 8.9–10.3)
Chloride: 107 mmol/L (ref 98–111)
Creatinine, Ser: 0.57 mg/dL (ref 0.44–1.00)
GFR calc Af Amer: >60 mL/min (ref >60)
GFR calc non Af Amer: >60 mL/min (ref >60)
Glucose, Bld: 94 mg/dL (ref 70–99)
Potassium: 3.6 mmol/L (ref 3.5–5.1)
Sodium: 139 mmol/L (ref 135–145)
Total Bilirubin: 0.3 mg/dL (ref 0.3–1.2)
Total Protein: 7.3 g/dL (ref 6.5–8.1)

## 2019-01-07 LAB — CBC WITH DIFFERENTIAL/PLATELET
Abs Immature Granulocytes: 0.03 10*3/uL (ref 0.00–0.07)
Basophils Absolute: 0 10*3/uL (ref 0.0–0.1)
Basophils Relative: 1 %
Eosinophils Absolute: 0.2 10*3/uL (ref 0.0–0.5)
Eosinophils Relative: 2 %
HCT: 44.4 % (ref 36.0–46.0)
Hemoglobin: 14.1 g/dL (ref 12.0–15.0)
Immature Granulocytes: 0 %
Lymphocytes Relative: 37 %
Lymphs Abs: 2.9 10*3/uL (ref 0.7–4.0)
MCH: 29.7 pg (ref 26.0–34.0)
MCHC: 31.8 g/dL (ref 30.0–36.0)
MCV: 93.7 fL (ref 80.0–100.0)
Monocytes Absolute: 0.8 10*3/uL (ref 0.1–1.0)
Monocytes Relative: 10 %
Neutro Abs: 4.1 10*3/uL (ref 1.7–7.7)
Neutrophils Relative %: 50 %
Platelets: 256 10*3/uL (ref 150–400)
RBC: 4.74 MIL/uL (ref 3.87–5.11)
RDW: 14.6 % (ref 11.5–15.5)
WBC: 8 10*3/uL (ref 4.0–10.5)
nRBC: 0 % (ref 0.0–0.2)

## 2019-01-07 LAB — LIPASE, BLOOD: Lipase: 38 U/L (ref 11–51)

## 2019-01-07 MED ORDER — DIPHENHYDRAMINE HCL 50 MG/ML IJ SOLN
25.0000 mg | Freq: Once | INTRAMUSCULAR | Status: AC
  Administered 2019-01-07: 25 mg via INTRAVENOUS
  Filled 2019-01-07: qty 1

## 2019-01-07 MED ORDER — SODIUM CHLORIDE 0.9 % IV BOLUS
1000.0000 mL | Freq: Once | INTRAVENOUS | Status: AC
  Administered 2019-01-07: 1000 mL via INTRAVENOUS

## 2019-01-07 MED ORDER — DEXAMETHASONE 4 MG PO TABS
10.0000 mg | ORAL_TABLET | Freq: Once | ORAL | Status: AC
  Administered 2019-01-07: 10 mg via ORAL
  Filled 2019-01-07: qty 2

## 2019-01-07 MED ORDER — DROPERIDOL 2.5 MG/ML IJ SOLN
2.5000 mg | Freq: Once | INTRAMUSCULAR | Status: AC
  Administered 2019-01-07: 2.5 mg via INTRAVENOUS
  Filled 2019-01-07: qty 2

## 2019-01-07 NOTE — Discharge Instructions (Signed)
Follow up with your PCP and neurologist.  Return for worsening symptoms, fever, stroke symptoms.

## 2019-01-07 NOTE — ED Triage Notes (Signed)
Per Patient: Patient reports she has a migraine and that this has occurred before. Pt reports nausea, emesis, and diarrhea.  Pt reports she has no cough, fever, or SOB.  Pt reports light sensitivity.

## 2019-01-07 NOTE — ED Notes (Signed)
Bed: WA10 Expected date:  Expected time:  Means of arrival:  Comments: 

## 2019-01-07 NOTE — ED Provider Notes (Signed)
[facility]-EMERGENCY DEPT Provider Note   CSN: [id number] Arrival date & time: 01/07/19  2048    History   Chief Complaint Chief Complaint  Patient presents with  . Migraine    HPI Stephanie [name] is a 53 y.o. female.     53 yo F with a cc of a headache. Going on for the past three days.  Started with n/v/d.  That has resolved but left with right frontal throbbing headache down to the right neck. Feels like prior migraines, worse with bright lights/loud noises.  No trauma, no fevers.     Migraine  This is a recurrent problem. The current episode started more than 2 days ago. The problem occurs constantly. The problem has been gradually worsening. Associated symptoms include headaches. Pertinent negatives include no chest pain and no shortness of breath. Nothing aggravates the symptoms. Nothing relieves the symptoms. The treatment provided no relief.    Past Medical History:  Diagnosis Date  . Diabetes mellitus without complication (HCC)   . Difficult intubation   . Diverticulitis   . Headache   . Hepatitis C    treated and resolved  . History of kidney stones   . Hypertension   . IBS (irritable bowel syndrome)   . Kidney stones   . Sciatica   . Sleep apnea    on cpap  . Stroke [facility]) 2017   memory loss    Patient Active Problem List   Diagnosis Date Noted  . Hypotension 08/22/2018  . Bradycardia 08/22/2018  . Type 2 diabetes mellitus (HCC) 08/22/2018  . Acute encephalopathy 08/21/2018  . Shoulder pain, right 12/22/2017  . UTI (urinary tract infection) 08/21/2017  . Nausea & vomiting 08/21/2017  . Nicotine dependence 08/21/2017  . Headache 01/07/2017  . Small bowel obstruction (HCC) 09/02/2016  . Pain and swelling of left upper extremity 07/22/2016  . Superficial venous thrombosis of arm, left 07/22/2016  . ICH (intracerebral hemorrhage) (HCC)   . Essential hypertension, malignant 04/19/2016  . Cytotoxic brain edema (HCC) 04/19/2016   . IVH (intraventricular hemorrhage) (HCC) 04/18/2016  . Hepatitis C     Past Surgical History:  Procedure Laterality Date  . ABDOMINAL HYSTERECTOMY    . carpel tunnel syndrome  2017  . HARDWARE REMOVAL Right 12/22/2017   Procedure: HARDWARE REMOVAL-RIGHT ARM;  Surgeon: Lyndle Herrlich, MD;  Location: [facility] ORS;  Service: Orthopedics;  Laterality: Right;  Removal of implant, superficial right humerus  . HUMERUS IM NAIL Right 03/11/2017   Procedure: INTRAMEDULLARY (IM) NAIL HUMERAL;  Surgeon: Lyndle Herrlich, MD;  Location: [facility] ORS;  Service: Orthopedics;  Laterality: Right;  . KIDNEY STONE SURGERY Right 02/12/12  . KIDNEY STONE SURGERY Left 07/15/2012  . LIGATION OF ARTERIOVENOUS  FISTULA Left 06/21/2016   Procedure: LIGATION OF ARTERIOVENOUS  FISTULA ( LIGATION BASILIC VEIN );  Surgeon: Renford Dills, MD;  Location: [facility] ORS;  Service: Vascular;  Laterality: Left;  . LITHOTRIPSY    . OOPHORECTOMY    . SINUS EXPLORATION    . TONSILLECTOMY       OB History   No obstetric history on file.      Home Medications    Prior to Admission medications   Medication Sig Start Date End Date Taking? Authorizing Provider  alprazolam Prudy Feeler) 2 MG tablet Take 2 mg by mouth 2 (two) times daily as needed for anxiety. 10/13/18   [provider]  aspirin-acetaminophen-caffeine (EXCEDRIN MIGRAINE) [phone] MG tablet Take 1 tablet by mouth  every 6 (six) hours as needed for headache.    [provider]  cefdinir (OMNICEF) 300 MG capsule Take 1 capsule (300 mg total) by mouth 2 (two) times daily. Patient not taking: Reported on 01/07/2019 11/20/18   Sharman CheekStafford, Phillip, MD  cloNIDine (CATAPRES) 0.2 MG tablet Take 0.2 mg by mouth 2 (two) times daily. 10/02/18   [provider]  clopidogrel (PLAVIX) 75 MG tablet Take 1 tablet (75 mg total) by mouth daily. 06/27/16   Marvel PlanXu, Jindong, MD  FLUoxetine (PROZAC) 40 MG capsule Take 40 mg by mouth daily.  06/28/18   [provider]   Galcanezumab-gnlm (EMGALITY) 120 MG/ML SOSY Inject 120 mg into the skin every 30 (thirty) days.    [provider]  glipiZIDE (GLUCOTROL) 5 MG tablet Take 1 tablet (5 mg total) by mouth 2 (two) times daily before a meal. 04/30/16   Lora PaulaKrall, Jennifer T, MD  hydrALAZINE (APRESOLINE) 25 MG tablet Take 25 mg by mouth 3 (three) times daily. 10/23/18   [provider]  ketorolac (TORADOL) 10 MG tablet Take 10 mg by mouth every 6 (six) hours as needed for moderate pain.     [provider]  loratadine (CLARITIN) 10 MG tablet Take 10 mg by mouth daily.    [provider]  losartan (COZAAR) 100 MG tablet Take 100 mg by mouth daily. 10/02/18   [provider]  metoprolol succinate (TOPROL-XL) 50 MG 24 hr tablet Take 50 mg by mouth daily. 06/03/18   [provider]  omeprazole (PRILOSEC) 40 MG capsule Take 40 mg by mouth daily.    [provider]  SYMBICORT 160-4.5 MCG/ACT inhaler Inhale 2 puffs into the lungs 2 (two) times daily. 09/08/18   [provider]  zolpidem (AMBIEN) 10 MG tablet Take 10 mg by mouth at bedtime as needed for sleep. 10/13/18   [provider]    Family History Family History  Problem Relation Age of Onset  . Heart failure Mother   . Diabetes Father   . Cancer Sister     Social History Social History   Tobacco Use  . Smoking status: Current Every Day Smoker    Packs/day: 1.00    Years: 1.50    Pack years: 1.50    Types: Cigarettes  . Smokeless tobacco: Never Used  Substance Use Topics  . Alcohol use: Not Currently    Comment: no alcohol since 2002  . Drug use: No    Comment: electric cig     Allergies   Gabapentin; Tylenol [acetaminophen]; Aspirin; Buprenorphine hcl; Codeine; Compazine; Ioxaglate; Ivp dye [iodinated diagnostic agents]; Metrizamide; Naproxen; Norco [hydrocodone-acetaminophen]; Penicillin g; Penicillins; Prochlorperazine maleate; Reglan [metoclopramide]; Sulfur; Tegretol  [carbamazepine]; Toradol [ketorolac tromethamine]; Tramadol; and Zofran [ondansetron hcl]   Review of Systems Review of Systems  Constitutional: Negative for chills and fever.  HENT: Negative for congestion and rhinorrhea.   Eyes: Positive for visual disturbance (blurry, similar to prior headaches). Negative for redness.  Respiratory: Negative for shortness of breath and wheezing.   Cardiovascular: Negative for chest pain and palpitations.  Gastrointestinal: Positive for diarrhea, nausea and vomiting.  Genitourinary: Negative for dysuria and urgency.  Musculoskeletal: Negative for arthralgias and myalgias.  Skin: Negative for pallor and wound.  Neurological: Positive for headaches. Negative for dizziness.     Physical Exam Updated Vital Signs BP (!) 162/114 (BP Location: Left Arm)   Pulse 88   Temp 98.2 F (36.8 C) (Oral)   Resp 16   SpO2 99%  Physical Exam Vitals signs and nursing note reviewed.  Constitutional:      General: She is not in acute distress.    Appearance: She is well-developed. She is not diaphoretic.  HENT:     Head: Normocephalic and atraumatic.  Eyes:     Pupils: Pupils are equal, round, and reactive to light.  Neck:     Musculoskeletal: Normal range of motion and neck supple.  Cardiovascular:     Rate and Rhythm: Normal rate and regular rhythm.     Heart sounds: No murmur. No friction rub. No gallop.   Pulmonary:     Effort: Pulmonary effort is normal.     Breath sounds: No wheezing or rales.  Abdominal:     General: There is no distension.     Palpations: Abdomen is soft.     Tenderness: There is no abdominal tenderness.  Musculoskeletal:        General: No tenderness.  Skin:    General: Skin is warm and dry.  Neurological:     Mental Status: She is alert and oriented to person, place, and time.     GCS: GCS eye subscore is 4. GCS verbal subscore is 5. GCS motor subscore is 6.     Cranial Nerves: Cranial nerves are intact.     Sensory:  Sensation is intact.     Motor: Motor function is intact.     Coordination: Coordination is intact.     Comments: Benign neuro exam  Psychiatric:        Behavior: Behavior normal.      ED Treatments / Results  Labs (all labs ordered are listed, but only abnormal results are displayed) Labs Reviewed  CBC WITH DIFFERENTIAL/PLATELET  COMPREHENSIVE METABOLIC PANEL  LIPASE, BLOOD    EKG None  Radiology No results found.  Procedures Procedures (including critical care time)  Medications Ordered in ED Medications  dexamethasone (DECADRON) tablet 10 mg (has no administration in time range)  sodium chloride 0.9 % bolus 1,000 mL (1,000 mLs Intravenous New Bag/Given 01/07/19 2221)  droperidol (INAPSINE) 2.5 MG/ML injection 2.5 mg (2.5 mg Intravenous Given 01/07/19 2218)  diphenhydrAMINE (BENADRYL) injection 25 mg (25 mg Intravenous Given 01/07/19 2217)     Initial Impression / Assessment and Plan / ED Course  I have reviewed the triage vital signs and the nursing notes.  Pertinent labs & imaging results that were available during my care of the patient were reviewed by me and considered in my medical decision making (see chart for details).        53 yo F with a cc of a headache.  Hx of migraine, feels the same.  Proceeded by n/v/d.  Will obtain labs, give fluids, headache cocktail and reassess.   Patient feeling better on reassessment. Labwork without lft elevation or lipase elevation.  No leukocytosis.  Renal function at baseline.   11:02 PM:  I have discussed the diagnosis/risks/treatment options with the patient and believe the pt to be eligible for discharge home to follow-up with PCP. We also discussed returning to the ED immediately if new or worsening sx occur. We discussed the sx which are most concerning (e.g., sudden worsening pain, fever, inability to tolerate by mouth) that necessitate immediate return. Medications administered to the patient during their visit and  any new prescriptions provided to the patient are listed below.  Medications given during this visit Medications  dexamethasone (DECADRON) tablet 10 mg (has no administration in time range)  sodium chloride 0.9 %  bolus 1,000 mL (1,000 mLs Intravenous New Bag/Given 01/07/19 2221)  droperidol (INAPSINE) 2.5 MG/ML injection 2.5 mg (2.5 mg Intravenous Given 01/07/19 2218)  diphenhydrAMINE (BENADRYL) injection 25 mg (25 mg Intravenous Given 01/07/19 2217)     The patient appears reasonably screen and/or stabilized for discharge and I doubt any other medical condition or other [facility] requiring further screening, evaluation, or treatment in the ED at this time prior to discharge.    Final Clinical Impressions(s) / ED Diagnoses   Final diagnoses:  Right-sided headache    ED Discharge Orders    None       Melene Plan, DO 01/07/19 2302

## 2019-01-12 ENCOUNTER — Other Ambulatory Visit: Payer: Self-pay

## 2019-01-12 ENCOUNTER — Emergency Department (HOSPITAL_COMMUNITY)
Admission: EM | Admit: 2019-01-12 | Discharge: 2019-01-12 | Disposition: A | Discharge status: Home / Self Care | Payer: No Typology Code available for payment source | Attending: Emergency Medicine | Admitting: Emergency Medicine

## 2019-01-12 ENCOUNTER — Emergency Department (HOSPITAL_COMMUNITY): Payer: No Typology Code available for payment source

## 2019-01-12 ENCOUNTER — Encounter (HOSPITAL_COMMUNITY): Payer: Self-pay | Admitting: Emergency Medicine

## 2019-01-12 DIAGNOSIS — Z79899 Other long term (current) drug therapy: Secondary | ICD-10-CM | POA: Diagnosis not present

## 2019-01-12 DIAGNOSIS — R1011 Right upper quadrant pain: Secondary | ICD-10-CM | POA: Diagnosis present

## 2019-01-12 DIAGNOSIS — Z7901 Long term (current) use of anticoagulants: Secondary | ICD-10-CM | POA: Insufficient documentation

## 2019-01-12 DIAGNOSIS — Z8673 Personal history of transient ischemic attack (TIA), and cerebral infarction without residual deficits: Secondary | ICD-10-CM | POA: Diagnosis not present

## 2019-01-12 DIAGNOSIS — F17210 Nicotine dependence, cigarettes, uncomplicated: Secondary | ICD-10-CM | POA: Insufficient documentation

## 2019-01-12 DIAGNOSIS — I10 Essential (primary) hypertension: Secondary | ICD-10-CM | POA: Diagnosis not present

## 2019-01-12 DIAGNOSIS — R109 Unspecified abdominal pain: Secondary | ICD-10-CM

## 2019-01-12 MED ORDER — LORAZEPAM 1 MG PO TABS
1.0000 mg | ORAL_TABLET | Freq: Once | ORAL | Status: AC
  Administered 2019-01-12: 1 mg via ORAL
  Filled 2019-01-12: qty 1

## 2019-01-12 NOTE — ED Provider Notes (Signed)
[facility] DEPT Provider Note   CSN: [id number] Arrival date & time: 01/12/19  1610    History   Chief Complaint Chief Complaint  Patient presents with  . Flank Pain    HPI Stephanie [name] is a 53 y.o. female.     HPI  53 year old female with history of diabetes, diverticulitis, hep C, kidney stones, hypertension, migraine headaches presents today complaining of right flank pain that began 3 days ago and worsened today about 1 AM.  She describes it as sharp and radiating from the right mid back into the right lower quadrant.  She has had some nausea and vomiting.  She feels that it is similar in nature to previous kidney stone pain.  She denies any hematuria, frequency, or pain with urination.  She has not taken any medications for this.  She states that she talked to her doctor today at [facility] clinic and was told to go to the hospital.  She reports that she went to [facility] and left there after she was told that they were not giving her pain medicine because they did not treat chronic pain.  She states that she pulled out her IV and came here.  She denies headache, neck pain, chest pain, change in cough, she states she has chronic cough from smoking, fever, chills, exposure to anybody with COVID-19.  However, she has been in this ED for migraine within the past week, at [facility], and back here now.   Past Medical History:  Diagnosis Date  . Diabetes mellitus without complication ([place])   . Difficult intubation   . Diverticulitis   . Headache   . Hepatitis C    treated and resolved  . History of kidney stones   . Hypertension   . IBS (irritable bowel syndrome)   . Kidney stones   . Sciatica   . Sleep apnea    on cpap  . Stroke [facility]) 2017   memory loss    Patient Active Problem List   Diagnosis Date Noted  . Hypotension 08/22/2018  . Bradycardia 08/22/2018  . Type 2 diabetes mellitus ([place]) 08/22/2018  . Acute encephalopathy  08/21/2018  . Shoulder pain, right 12/22/2017  . UTI (urinary tract infection) 08/21/2017  . Nausea & vomiting 08/21/2017  . Nicotine dependence 08/21/2017  . Headache 01/07/2017  . Small bowel obstruction ([place]) 09/02/2016  . Pain and swelling of left upper extremity 07/22/2016  . Superficial venous thrombosis of arm, left 07/22/2016  . ICH (intracerebral hemorrhage) ([place])   . Essential hypertension, malignant 04/19/2016  . Cytotoxic brain edema ([place]) 04/19/2016  . IVH (intraventricular hemorrhage) ([place]) 04/18/2016  . Hepatitis C     Past Surgical History:  Procedure Laterality Date  . ABDOMINAL HYSTERECTOMY    . carpel tunnel syndrome  2017  . HARDWARE REMOVAL Right 12/22/2017   Procedure: HARDWARE REMOVAL-RIGHT ARM;  Surgeon: Lovell Sheehan, MD;  Location: [facility] ORS;  Service: Orthopedics;  Laterality: Right;  Removal of implant, superficial right humerus  . HUMERUS IM NAIL Right 03/11/2017   Procedure: INTRAMEDULLARY (IM) NAIL HUMERAL;  Surgeon: Lovell Sheehan, MD;  Location: [facility] ORS;  Service: Orthopedics;  Laterality: Right;  . KIDNEY STONE SURGERY Right 02/12/12  . KIDNEY STONE SURGERY Left 07/15/2012  . LIGATION OF ARTERIOVENOUS  FISTULA Left 06/21/2016   Procedure: LIGATION OF ARTERIOVENOUS  FISTULA ( LIGATION BASILIC VEIN );  Surgeon: Katha Cabal, MD;  Location: [facility] ORS;  Service: Vascular;  Laterality: Left;  .  LITHOTRIPSY    . OOPHORECTOMY    . SINUS EXPLORATION    . TONSILLECTOMY       OB History   No obstetric history on file.      Home Medications    Prior to Admission medications   Medication Sig Start Date End Date Taking? Authorizing Provider  alprazolam Duanne Moron) 2 MG tablet Take 2 mg by mouth 2 (two) times daily as needed for anxiety. 10/13/18   [provider]  aspirin-acetaminophen-caffeine (EXCEDRIN MIGRAINE) [phone] MG tablet Take 1 tablet by mouth every 6 (six) hours as needed for headache.    [provider]  cefdinir  (OMNICEF) 300 MG capsule Take 1 capsule (300 mg total) by mouth 2 (two) times daily. Patient not taking: Reported on 01/07/2019 11/20/18   Carrie Mew, MD  cloNIDine (CATAPRES) 0.2 MG tablet Take 0.2 mg by mouth 2 (two) times daily. 10/02/18   [provider]  clopidogrel (PLAVIX) 75 MG tablet Take 1 tablet (75 mg total) by mouth daily. 06/27/16   Rosalin Hawking, MD  FLUoxetine (PROZAC) 40 MG capsule Take 40 mg by mouth daily.  06/28/18   [provider]  Galcanezumab-gnlm (EMGALITY) 120 MG/ML SOSY Inject 120 mg into the skin every 30 (thirty) days.    [provider]  glipiZIDE (GLUCOTROL) 5 MG tablet Take 1 tablet (5 mg total) by mouth 2 (two) times daily before a meal. 04/30/16   Milagros Loll, MD  hydrALAZINE (APRESOLINE) 25 MG tablet Take 25 mg by mouth 3 (three) times daily. 10/23/18   [provider]  ketorolac (TORADOL) 10 MG tablet Take 10 mg by mouth every 6 (six) hours as needed for moderate pain.     [provider]  loratadine (CLARITIN) 10 MG tablet Take 10 mg by mouth daily.    [provider]  losartan (COZAAR) 100 MG tablet Take 100 mg by mouth daily. 10/02/18   [provider]  metoprolol succinate (TOPROL-XL) 50 MG 24 hr tablet Take 50 mg by mouth daily. 06/03/18   [provider]  omeprazole (PRILOSEC) 40 MG capsule Take 40 mg by mouth daily.    [provider]  SYMBICORT 160-4.5 MCG/ACT inhaler Inhale 2 puffs into the lungs 2 (two) times daily. 09/08/18   [provider]  zolpidem (AMBIEN) 10 MG tablet Take 10 mg by mouth at bedtime as needed for sleep. 10/13/18   [provider]    Family History Family History  Problem Relation Age of Onset  . Heart failure Mother   . Diabetes Father   . Cancer Sister     Social History Social History   Tobacco Use  . Smoking status: Current Every Day Smoker    Packs/day: 1.00    Years: 1.50    Pack years: 1.50    Types: Cigarettes   . Smokeless tobacco: Never Used  Substance Use Topics  . Alcohol use: Not Currently    Comment: no alcohol since 2002  . Drug use: No    Comment: electric cig     Allergies   Gabapentin; Tylenol [acetaminophen]; Aspirin; Buprenorphine hcl; Codeine; Compazine; Ioxaglate; Ivp dye [iodinated diagnostic agents]; Metrizamide; Naproxen; Norco [hydrocodone-acetaminophen]; Penicillin g; Penicillins; Prochlorperazine maleate; Reglan [metoclopramide]; Sulfur; Tegretol [carbamazepine]; Toradol [ketorolac tromethamine]; Tramadol; and Zofran [ondansetron hcl]   Review of Systems Review of Systems  All other systems reviewed and are negative.    Physical Exam Updated Vital Signs BP (!) 146/95 (BP Location: Right Arm)   Pulse 92  Temp 98.3 F (36.8 C) (Oral)   Resp 20   SpO2 99%   Physical Exam Vitals signs and nursing note reviewed.  Constitutional:      General: She is not in acute distress.    Appearance: Normal appearance. She is obese. She is not ill-appearing.  HENT:     Head: Normocephalic and atraumatic.     Right Ear: External ear normal.     Left Ear: External ear normal.     Nose: Nose normal.     Mouth/Throat:     Mouth: Mucous membranes are moist.  Eyes:     Extraocular Movements: Extraocular movements intact.     Pupils: Pupils are equal, round, and reactive to light.  Neck:     Musculoskeletal: Normal range of motion and neck supple.  Cardiovascular:     Rate and Rhythm: Normal rate and regular rhythm.     Pulses: Normal pulses.     Heart sounds: Normal heart sounds.  Pulmonary:     Effort: Pulmonary effort is normal.     Breath sounds: Normal breath sounds.  Abdominal:     General: Abdomen is flat. Bowel sounds are normal.     Palpations: Abdomen is soft.     Tenderness: There is abdominal tenderness. There is no right CVA tenderness, left CVA tenderness or rebound.     Comments: Patient with tenderness palpation in right lower quadrant  Musculoskeletal:  Normal range of motion.  Skin:    General: Skin is warm and dry.     Capillary Refill: Capillary refill takes less than 2 seconds.  Neurological:     General: No focal deficit present.     Mental Status: She is alert and oriented to person, place, and time. Mental status is at baseline.  Psychiatric:        Mood and Affect: Mood normal.        Behavior: Behavior normal.      ED Treatments / Results  Labs (all labs ordered are listed, but only abnormal results are displayed) Labs Reviewed - No data to display  EKG None  Radiology Ct Abdomen Pelvis Wo Contrast  Result Date: 01/12/2019 CLINICAL DATA:  Abdominal pain EXAM: CT ABDOMEN AND PELVIS WITHOUT CONTRAST TECHNIQUE: Multidetector CT imaging of the abdomen and pelvis was performed following the standard protocol without IV contrast. COMPARISON:  CT 11/20/2018, 11/18/2018, 10/21/2018, 08/21/2017 FINDINGS: Lower chest: Stable left lung base pulmonary nodule. No acute consolidation or pleural effusion. Heart size within normal limits. Hepatobiliary: Mild increased density within the gallbladder may reflect small stones or sludge. No focal hepatic abnormality or biliary dilatation Pancreas: Unremarkable. No pancreatic ductal dilatation or surrounding inflammatory changes. Spleen: Normal in size without focal abnormality. Adrenals/Urinary Tract: Adrenal glands are normal. Cortical scarring within the right greater than left kidneys. Scattered cortical calcifications, no change. Right greater than left intrarenal stones. No hydronephrosis or ureteral stones. Bladder normal Stomach/Bowel: Stomach is within normal limits. Appendix appears normal. No evidence of bowel wall thickening, distention, or inflammatory changes. Diverticular changes of the sigmoid colon without acute inflammatory process Vascular/Lymphatic: Nonaneurysmal aorta. Mild aortic atherosclerosis. No significantly enlarged lymph nodes Reproductive: Status post hysterectomy. No  adnexal masses. Other: Negative for free air or free fluid Musculoskeletal: Degenerative changes of the spine without acute or suspicious osseous abnormality IMPRESSION: 1. Negative for hydronephrosis or ureteral stone. Negative for appendicitis. 2. Cortical scarring within both kidneys with bilateral right greater than left intrarenal stones 3. Small amount of gallbladder sludge versus  small stones 4. Diverticular disease of the sigmoid colon without acute inflammatory process Electronically Signed   By: Donavan Foil M.D.   On: 01/12/2019 17:50    Procedures Procedures (including critical care time)  Medications Ordered in ED Medications - No data to display Reviewed labs from [facility] today CBC normal C-Met hour-long Urinalysis with 6 white blood cells and no report of hematuria   Initial Impression / Assessment and Plan / ED Course  I have reviewed the triage vital signs and the nursing notes.  Pertinent labs & imaging results that were available during my care of the patient were reviewed by me and considered in my medical decision making (see chart for details).    Plan CT abdomen pelvis to evaluate appendix and for kidney stone    Reviewed CAT scan.  No appendicitis or ureteral stone noted Some sludge noted in gallbladder Reviewed labs again from [facility] and no elevation of bilirubin, white blood cell count, or LFTs.  Therefore, doubt that this is acute cholecystitis and pain is more in the flank and right lower quadrant.  No obvious source of pain found.  Patient states nothing helps her pain is morphine.  I discussed with the patient that without source for pain, I am hesitant to treat with any scheduled narcotics.  She states "just send me home then, you not can help me anymore than [facility]. "We will treat symptomatically and discharged patient Discussed return precautions need for follow-up.  Final Clinical Impressions(s) / ED Diagnoses   Final diagnoses:  Right flank pain     ED Discharge Orders    None       Pattricia Boss, MD 01/12/19 [phone]

## 2019-01-12 NOTE — ED Notes (Signed)
Pt being driven home by husband.

## 2019-01-12 NOTE — ED Notes (Signed)
Pt transported to CT at this time.

## 2019-01-12 NOTE — ED Triage Notes (Signed)
Pt c/o right flank pain that radiates to Right later abd since yesterday. Reports that was at [facility] ED this morning and was told that they dont treat chronic pain and she would have to leave. Adds that she had n/v/d for several days.

## 2019-01-16 ENCOUNTER — Encounter (HOSPITAL_COMMUNITY): Payer: Self-pay | Admitting: *Deleted

## 2019-01-16 ENCOUNTER — Emergency Department (HOSPITAL_COMMUNITY)
Admission: EM | Admit: 2019-01-16 | Discharge: 2019-01-16 | Disposition: A | Discharge status: Home / Self Care | Payer: No Typology Code available for payment source | Attending: Emergency Medicine | Admitting: Emergency Medicine

## 2019-01-16 ENCOUNTER — Other Ambulatory Visit: Payer: Self-pay

## 2019-01-16 ENCOUNTER — Emergency Department (HOSPITAL_COMMUNITY): Payer: No Typology Code available for payment source

## 2019-01-16 DIAGNOSIS — Z7902 Long term (current) use of antithrombotics/antiplatelets: Secondary | ICD-10-CM | POA: Insufficient documentation

## 2019-01-16 DIAGNOSIS — Z79899 Other long term (current) drug therapy: Secondary | ICD-10-CM | POA: Insufficient documentation

## 2019-01-16 DIAGNOSIS — I10 Essential (primary) hypertension: Secondary | ICD-10-CM | POA: Diagnosis not present

## 2019-01-16 DIAGNOSIS — F17210 Nicotine dependence, cigarettes, uncomplicated: Secondary | ICD-10-CM | POA: Diagnosis not present

## 2019-01-16 DIAGNOSIS — E119 Type 2 diabetes mellitus without complications: Secondary | ICD-10-CM | POA: Diagnosis not present

## 2019-01-16 DIAGNOSIS — Z7984 Long term (current) use of oral hypoglycemic drugs: Secondary | ICD-10-CM | POA: Diagnosis not present

## 2019-01-16 DIAGNOSIS — R1031 Right lower quadrant pain: Secondary | ICD-10-CM | POA: Diagnosis present

## 2019-01-16 DIAGNOSIS — Z8673 Personal history of transient ischemic attack (TIA), and cerebral infarction without residual deficits: Secondary | ICD-10-CM | POA: Insufficient documentation

## 2019-01-16 DIAGNOSIS — R109 Unspecified abdominal pain: Secondary | ICD-10-CM

## 2019-01-16 LAB — URINALYSIS, ROUTINE W REFLEX MICROSCOPIC
Bilirubin Urine: NEGATIVE
Glucose, UA: NEGATIVE mg/dL
Hgb urine dipstick: NEGATIVE
Ketones, ur: NEGATIVE mg/dL
Nitrite: NEGATIVE
Protein, ur: NEGATIVE mg/dL
Specific Gravity, Urine: 1.006 (ref 1.005–1.030)
pH: 7 (ref 5.0–8.0)

## 2019-01-16 LAB — COMPREHENSIVE METABOLIC PANEL
ALT: 25 U/L (ref 0–44)
AST: 22 U/L (ref 15–41)
Albumin: 3.9 g/dL (ref 3.5–5.0)
Alkaline Phosphatase: 111 U/L (ref 38–126)
Anion gap: 15 (ref 5–15)
BUN: 7 mg/dL (ref 6–20)
CO2: 20 mmol/L — ABNORMAL LOW (ref 22–32)
Calcium: 9.5 mg/dL (ref 8.9–10.3)
Chloride: 104 mmol/L (ref 98–111)
Creatinine, Ser: 0.6 mg/dL (ref 0.44–1.00)
GFR calc Af Amer: >60 mL/min (ref >60)
GFR calc non Af Amer: >60 mL/min (ref >60)
Glucose, Bld: 132 mg/dL — ABNORMAL HIGH (ref 70–99)
Potassium: 4.1 mmol/L (ref 3.5–5.1)
Sodium: 139 mmol/L (ref 135–145)
Total Bilirubin: 0.7 mg/dL (ref 0.3–1.2)
Total Protein: 7.5 g/dL (ref 6.5–8.1)

## 2019-01-16 LAB — CBC WITH DIFFERENTIAL/PLATELET
Abs Immature Granulocytes: 0.02 10*3/uL (ref 0.00–0.07)
Basophils Absolute: 0 10*3/uL (ref 0.0–0.1)
Basophils Relative: 1 %
Eosinophils Absolute: 0.2 10*3/uL (ref 0.0–0.5)
Eosinophils Relative: 2 %
HCT: 46 % (ref 36.0–46.0)
Hemoglobin: 14.6 g/dL (ref 12.0–15.0)
Immature Granulocytes: 0 %
Lymphocytes Relative: 32 %
Lymphs Abs: 2.6 10*3/uL (ref 0.7–4.0)
MCH: 29.6 pg (ref 26.0–34.0)
MCHC: 31.7 g/dL (ref 30.0–36.0)
MCV: 93.3 fL (ref 80.0–100.0)
Monocytes Absolute: 0.8 10*3/uL (ref 0.1–1.0)
Monocytes Relative: 10 %
Neutro Abs: 4.6 10*3/uL (ref 1.7–7.7)
Neutrophils Relative %: 55 %
Platelets: 223 10*3/uL (ref 150–400)
RBC: 4.93 MIL/uL (ref 3.87–5.11)
RDW: 15.1 % (ref 11.5–15.5)
WBC: 8.2 10*3/uL (ref 4.0–10.5)
nRBC: 0 % (ref 0.0–0.2)

## 2019-01-16 LAB — LIPASE, BLOOD: Lipase: 26 U/L (ref 11–51)

## 2019-01-16 LAB — I-STAT BETA HCG BLOOD, ED (MC, WL, AP ONLY): I-stat hCG, quantitative: 5.1 m[IU]/mL — ABNORMAL HIGH (ref <5)

## 2019-01-16 LAB — D-DIMER, QUANTITATIVE: D-Dimer, Quant: 0.45 ug/mL-FEU (ref 0.00–0.50)

## 2019-01-16 MED ORDER — FENTANYL CITRATE (PF) 100 MCG/2ML IJ SOLN
50.0000 ug | Freq: Once | INTRAMUSCULAR | Status: AC
  Administered 2019-01-16: 50 ug via INTRAVENOUS
  Filled 2019-01-16: qty 2

## 2019-01-16 MED ORDER — SODIUM CHLORIDE 0.9 % IV BOLUS
1000.0000 mL | Freq: Once | INTRAVENOUS | Status: AC
  Administered 2019-01-16: 1000 mL via INTRAVENOUS

## 2019-01-16 MED ORDER — SODIUM CHLORIDE 0.9 % IV SOLN: INTRAVENOUS | Status: DC

## 2019-01-16 MED ORDER — ETODOLAC 300 MG PO CAPS: 300.0000 mg | ORAL_CAPSULE | Freq: Three times a day (TID) | ORAL | 0 refills | Status: DC | PRN

## 2019-01-16 NOTE — ED Triage Notes (Signed)
Pt returns today with same symptoms as seen for on 01/12/19. Continues to have rt flank pain, had a CT which she states revealed Kidney and Gall bladder stones, pt anxious and hypertensive in triage.

## 2019-01-16 NOTE — Discharge Instructions (Addendum)
Continue your current medications, follow-up with your primary care doctor °

## 2019-01-16 NOTE — ED Provider Notes (Signed)
[facility]-EMERGENCY DEPT Provider Note   CSN: [id number] Arrival date & time: 01/16/19  9150    History   Chief Complaint Chief Complaint  Patient presents with   Flank Pain    Rt    HPI Stephanie [name] is a 53 y.o. female.     HPI Patient presents to the emergency room with complaints of persistent flank pain.  Patient states she has had pain in her right flank ongoing for at least a week.  Pain is sharp.  She has also noticed that her heart is racing.  She denies any vomiting or diarrhea.  She has not had any change in her appetite.  Eating or drinking does not make it worse.  Patient was seen in the emergency room on May 5.  At that time she had laboratory tests and a CT scan.  Her CT scan showed persistent renal stones but no evidence of hydronephrosis or ureteral stones.  Her CT scan also suggested the possibility of gallbladder sludge versus small stones but no other signs of inflammation.  Patient states she continues to have pain so she came back to the ED. Past Medical History:  Diagnosis Date   Diabetes mellitus without complication (HCC)    Difficult intubation    Diverticulitis    Headache    Hepatitis C    treated and resolved   History of kidney stones    Hypertension    IBS (irritable bowel syndrome)    Kidney stones    Sciatica    Sleep apnea    on cpap   Stroke (HCC) 2017   memory loss    Patient Active Problem List   Diagnosis Date Noted   Hypotension 08/22/2018   Bradycardia 08/22/2018   Type 2 diabetes mellitus (HCC) 08/22/2018   Acute encephalopathy 08/21/2018   Shoulder pain, right 12/22/2017   UTI (urinary tract infection) 08/21/2017   Nausea & vomiting 08/21/2017   Nicotine dependence 08/21/2017   Headache 01/07/2017   Small bowel obstruction (HCC) 09/02/2016   Pain and swelling of left upper extremity 07/22/2016   Superficial venous thrombosis of arm, left 07/22/2016   ICH (intracerebral  hemorrhage) (HCC)    Essential hypertension, malignant 04/19/2016   Cytotoxic brain edema (HCC) 04/19/2016   IVH (intraventricular hemorrhage) (HCC) 04/18/2016   Hepatitis C     Past Surgical History:  Procedure Laterality Date   ABDOMINAL HYSTERECTOMY     carpel tunnel syndrome  2017   HARDWARE REMOVAL Right 12/22/2017   Procedure: HARDWARE REMOVAL-RIGHT ARM;  Surgeon: Lyndle Herrlich, MD;  Location: [facility] ORS;  Service: Orthopedics;  Laterality: Right;  Removal of implant, superficial right humerus   HUMERUS IM NAIL Right 03/11/2017   Procedure: INTRAMEDULLARY (IM) NAIL HUMERAL;  Surgeon: Lyndle Herrlich, MD;  Location: [facility] ORS;  Service: Orthopedics;  Laterality: Right;   KIDNEY STONE SURGERY Right 02/12/12   KIDNEY STONE SURGERY Left 07/15/2012   LIGATION OF ARTERIOVENOUS  FISTULA Left 06/21/2016   Procedure: LIGATION OF ARTERIOVENOUS  FISTULA ( LIGATION BASILIC VEIN );  Surgeon: Renford Dills, MD;  Location: [facility] ORS;  Service: Vascular;  Laterality: Left;   LITHOTRIPSY     OOPHORECTOMY     SINUS EXPLORATION     TONSILLECTOMY       OB History   No obstetric history on file.      Home Medications    Prior to Admission medications   Medication Sig Start Date End Date Taking? Authorizing  Provider  alprazolam Prudy Feeler) 2 MG tablet Take 2 mg by mouth 2 (two) times daily as needed for anxiety. 10/13/18  Yes [provider]  atorvastatin (LIPITOR) 40 MG tablet Take 40 mg by mouth daily. 12/10/18  Yes [provider]  Butalbital-APAP-Caffeine 50-300-40 MG CAPS Take 1 capsule by mouth every 8 (eight) hours as needed (migraine).  11/18/18  Yes [provider]  ciprofloxacin (CIPRO) 500 MG tablet Take 500 mg by mouth daily. 01/09/19 01/16/19 Yes [provider]  cloNIDine (CATAPRES) 0.2 MG tablet Take 0.2 mg by mouth 2 (two) times daily. 10/02/18  Yes [provider]  clopidogrel (PLAVIX) 75 MG tablet Take 1 tablet (75 mg total) by  mouth daily. 06/27/16  Yes Marvel Plan, MD  FLUoxetine (PROZAC) 40 MG capsule Take 40 mg by mouth daily.  06/28/18  Yes [provider]  Galcanezumab-gnlm (EMGALITY) 120 MG/ML SOSY Inject 120 mg into the skin every 30 (thirty) days.   Yes [provider]  glipiZIDE (GLUCOTROL) 5 MG tablet Take 1 tablet (5 mg total) by mouth 2 (two) times daily before a meal. 04/30/16  Yes Lora Paula, MD  hydrALAZINE (APRESOLINE) 50 MG tablet Take 50 mg by mouth 3 (three) times daily. 12/08/18  Yes [provider]  loratadine (CLARITIN) 10 MG tablet Take 10 mg by mouth daily.   Yes [provider]  losartan (COZAAR) 100 MG tablet Take 100 mg by mouth daily. 10/02/18  Yes [provider]  omeprazole (PRILOSEC) 40 MG capsule Take 40 mg by mouth daily.   Yes [provider]  zolpidem (AMBIEN) 10 MG tablet Take 10 mg by mouth at bedtime.  10/13/18  Yes [provider]  etodolac (LODINE) 300 MG capsule Take 1 capsule (300 mg total) by mouth 3 (three) times daily as needed (pain). 01/16/19   Linwood Dibbles, MD    Family History Family History  Problem Relation Age of Onset   Heart failure Mother    Diabetes Father    Cancer Sister     Social History Social History   Tobacco Use   Smoking status: Current Every Day Smoker    Packs/day: 1.00    Years: 1.50    Pack years: 1.50    Types: Cigarettes   Smokeless tobacco: Never Used  Substance Use Topics   Alcohol use: Not Currently    Comment: no alcohol since 2002   Drug use: No    Comment: electric cig     Allergies   Gabapentin; Tylenol [acetaminophen]; Aspirin; Buprenorphine hcl; Codeine; Compazine; Ioxaglate; Ivp dye [iodinated diagnostic agents]; Metrizamide; Naproxen; Norco [hydrocodone-acetaminophen]; Penicillin g; Prochlorperazine maleate; Reglan [metoclopramide]; Sulfur; Tegretol [carbamazepine]; Toradol [ketorolac tromethamine]; Tramadol; and Zofran [ondansetron hcl]   Review of  Systems Review of Systems  All other systems reviewed and are negative.    Physical Exam Updated Vital Signs BP 125/80    Pulse 93    Temp 97.9 F (36.6 C) (Oral)    Resp 19    Ht 1.575 m ( )    Wt 91.6 kg    SpO2 97%    BMI 36.95 kg/m   Physical Exam Vitals signs and nursing note reviewed.  Constitutional:      Appearance: She is well-developed.     Comments: Appears to be in pain  HENT:     Head: Normocephalic and atraumatic.     Right Ear: External ear normal.     Left Ear: External ear normal.  Eyes:  General: No scleral icterus.       Right eye: No discharge.        Left eye: No discharge.     Conjunctiva/sclera: Conjunctivae normal.  Neck:     Musculoskeletal: Neck supple.     Trachea: No tracheal deviation.  Cardiovascular:     Rate and Rhythm: Regular rhythm. Bradycardia present.  Pulmonary:     Effort: Pulmonary effort is normal. No respiratory distress.     Breath sounds: Normal breath sounds. No stridor. No wheezing or rales.  Abdominal:     General: Bowel sounds are normal. There is no distension.     Palpations: Abdomen is soft.     Tenderness: There is no abdominal tenderness. There is no guarding or rebound.  Musculoskeletal:        General: No tenderness.  Skin:    General: Skin is warm and dry.     Findings: No rash.     Comments: No vesicular rash noted in the flank area  Neurological:     Cranial Nerves: No cranial nerve deficit (no facial droop, extraocular movements intact, no slurred speech).     Sensory: No sensory deficit.     Motor: No abnormal muscle tone or seizure activity.     Coordination: Coordination normal.      ED Treatments / Results  Labs (all labs ordered are listed, but only abnormal results are displayed) Labs Reviewed  COMPREHENSIVE METABOLIC PANEL - Abnormal; Notable for the following components:      Result Value   CO2 20 (*)    Glucose, Bld 132 (*)    All other components within normal limits  URINALYSIS,  ROUTINE W REFLEX MICROSCOPIC - Abnormal; Notable for the following components:   Leukocytes,Ua MODERATE (*)    Bacteria, UA RARE (*)    All other components within normal limits  I-STAT BETA HCG BLOOD, ED ([facility], [facility], [facility] ONLY) - Abnormal; Notable for the following components:   I-stat hCG, quantitative 5.1 (*)    All other components within normal limits  URINE CULTURE  LIPASE, BLOOD  CBC WITH DIFFERENTIAL/PLATELET  D-DIMER, QUANTITATIVE (NOT AT [facility])    EKG EKG Interpretation  Date/Time:  Saturday Jan 16 2019 12:11:15 EDT Ventricular Rate:  95 PR Interval:    QRS Duration: 93 QT Interval:  373 QTC Calculation: 469 R Axis:   -8 Text Interpretation:  Sinus rhythm Low voltage, precordial leads No significant change since last tracing Confirmed by Linwood DibblesKnapp, Aspyn Warnke [phone]([id number]) on 01/16/2019 12:37:35 PM   Radiology Dg Chest Portable 1 View  Result Date: 01/16/2019 CLINICAL DATA:  Pt returned today with same symptoms as seen for on 01/12/19. Pt reported she continues to have right flank pain. Pt stated she had a CT which she states revealed kidney and gall bladder stones.chest pain EXAM: PORTABLE CHEST 1 VIEW COMPARISON:  11/18/2018 FINDINGS: Normal mediastinum and cardiac silhouette. Normal pulmonary vasculature. No evidence of effusion, infiltrate, or pneumothorax. No acute bony abnormality. IMPRESSION: No acute cardiopulmonary process. Electronically Signed   By: Genevive BiStewart  Edmunds M.D.   On: 01/16/2019 11:21    Procedures Procedures (including critical care time)  Medications Ordered in ED Medications  sodium chloride 0.9 % bolus 1,000 mL (0 mLs Intravenous Stopped 01/16/19 1210)    And  0.9 %  sodium chloride infusion (has no administration in time range)  fentaNYL (SUBLIMAZE) injection 50 mcg (has no administration in time range)  fentaNYL (SUBLIMAZE) injection 50 mcg (50 mcg Intravenous Given 01/16/19 1046)  Initial Impression / Assessment and Plan / ED Course  I have reviewed the triage  vital signs and the nursing notes.  Pertinent labs & imaging results that were available during my care of the patient were reviewed by me and considered in my medical decision making (see chart for details).  Clinical Course as of Jan 15 1301  Sat Jan 16, 2019  1237 Persistent flank pain.  ED workup reassuring.  NO PNA on CXR.  D dimer negative, doubt PE.  Tachycardic improved after pain management.  UA with possible UTI   [JK]    Clinical Course User Index [JK] Linwood Dibbles, MD     Patient presented to ED for recurrent flank pain.  Patient has been seen a number of times at this point for the same complaint.  She was seen at a [facility] facility on April 2 for back pain.  She was then seen at another facility on that same day.  She was at a [facility] facility on May 5 and then went to [facility] on May 5.  Patient had a CT scan performed that did not show any acute pathology.  I reviewed those findings today.  Patient's laboratory tests are unremarkable.  Her urinalysis does show some white blood cells and bacteria.  She is already on ciprofloxacin.  Patient does not have any urinary symptoms I do not feel like she needs to change antibiotics (multiple med allergies) at this time but I will send off a urine culture.  Patient appears to have recurrent episodes of abdominal pain.  It is unclear what the etiology is at this point but I do not see any evidence of emergency medical condition.  Patient states she has been taking ibuprofen without relief.  I will have her try Lodine.  Patient should follow-up with her primary care doctor.  Final Clinical Impressions(s) / ED Diagnoses   Final diagnoses:  Flank pain    ED Discharge Orders         Ordered    etodolac (LODINE) 300 MG capsule  3 times daily PRN    Note to Pharmacy:  As needed for pain   01/16/19 1259           Linwood Dibbles, MD 01/16/19 1304

## 2019-01-17 LAB — URINE CULTURE: Culture: NO GROWTH

## 2019-02-04 NOTE — Progress Notes (Deleted)
Patient's Name: Stephanie [name]  MRN: [id number]  Referring Provider: [name], NP  DOB: 04/13/1966  PCP: [name], NP  DOS: 02/08/2019  Note by: Gillis Santa, MD  Service setting: Ambulatory outpatient  Specialty: Interventional Pain Management  Location: [facility] Virtual Visit  Visit type: Initial Patient Evaluation  Patient type: New Patient   Pain Management Virtual Encounter Note - Virtual Visit via  ***  Telehealth (real-time audio visits between healthcare provider and patient).  Patient's Phone No.:  [phone] (home); [phone] (mobile); (Preferred) [phone] [email or web address], [address] - [place] Phone: [phone] Fax: [phone]   Pre-screening note:  Our staff contacted Stephanie [name] and offered her an "in person", "face-to-face" appointment versus a telephone encounter. She indicated preferring the telephone encounter, at this time.  Primary Reason(s) for Visit: Tele-Encounter for initial evaluation of one or more chronic problems (new to examiner) potentially causing chronic pain, and posing a threat to normal musculoskeletal function. (Level of risk: High) CC: No chief complaint on file.  I contacted Stephanie [name] on 02/08/2019 at 3:40 PM via  *** .      I clearly identified myself as 19/09/2018, MD. I verified that I was speaking with the correct person using two identifiers (Name and date of birth: February 15, 1966).  Advanced Informed Consent I sought verbal advanced consent from 10/18/1965 for virtual visit interactions. I informed Stephanie [name] of possible security and privacy concerns, risks, and limitations associated with providing "not-in-person" medical evaluation and management services. I also informed Stephanie [name] of the availability of "in-person" appointments. Finally, I informed her that there would be a charge for the virtual visit and that  she could be  personally, fully or partially, financially responsible for it. Stephanie [name] expressed understanding and agreed to proceed.   HPI  Stephanie [name] is a 53 y.o. year old, female patient, contacted today for an initial evaluation of her chronic pain. She has Hepatitis C; IVH (intraventricular hemorrhage) ([place]); Essential hypertension, malignant; Cytotoxic brain edema ([facility]); ICH (intracerebral hemorrhage) ([place]); Pain and swelling of left upper extremity; Superficial venous thrombosis of arm, left; Small bowel obstruction ([place]); Headache; UTI (urinary tract infection); Nausea & vomiting; Nicotine dependence; Shoulder pain, right; Acute encephalopathy; Hypotension; Bradycardia; and Type 2 diabetes mellitus ([place]) on their problem list.  Pain Assessment: Location:     Radiating:   Onset:   Duration:   Quality:   Severity:  /10 (subjective, self-reported pain score)  Effect on ADL:   Timing:   Modifying factors:    Onset and Duration: {Hx; Onset and Duration:210120511} Cause of pain: {Hx; Cause:210120521} Severity: {Pain Severity:210120502} Timing: {Symptoms; Timing:210120501} Aggravating Factors: {Causes; Aggravating pain factors:210120507} Alleviating Factors: {Causes; Alleviating Factors:210120500} Associated Problems: {Hx; Associated problems:210120515} Quality of Pain: {Hx; Symptom quality or Descriptor:210120531} Previous Examinations or Tests: {Hx; Previous examinations or test:210120529} Previous Treatments: {Hx; Previous Treatment:210120503}  ***  The patient was informed that my practice is divided into two sections: an interventional pain management section, as well as a completely separate and distinct medication management section. I explained that I have procedure days for my interventional therapies, and evaluation days for follow-ups and medication management. Because of the amount of documentation required during both, they are kept separated. This means that there is  the possibility that she may be scheduled for a procedure on one day, and medication management the next. I have also informed her that because  of staffing and facility limitations, I no longer take patients for medication management only. To illustrate the reasons for this, I gave the patient the example of surgeons, and how inappropriate it would be to refer a patient to his/her care, just to write for the post-surgical antibiotics on a surgery done by a different surgeon.   Because interventional pain management is my board-certified specialty, the patient was informed that joining my practice means that they are open to any and all interventional therapies. I made it clear that this does not mean that they will be forced to have any procedures done. What this means is that I believe interventional therapies to be essential part of the diagnosis and proper management of chronic pain conditions. Therefore, patients not interested in these interventional alternatives will be better served under the care of a different practitioner.  The patient was also made aware of my Comprehensive Pain Management Safety Guidelines where by joining my practice, they limit all of their nerve blocks and joint injections to those done by our practice, for as long as we are retained to manage their care.   Historic Controlled Substance Pharmacotherapy Review  PMP and historical list of controlled substances: ***  Highest opioid analgesic regimen found: ***  Most recent opioid analgesic: ***  Current opioid analgesics: ***  Highest recorded MME/day: *** mg/day MME/day: *** mg/day Medications: Bottles not available for inspection. Pharmacodynamics: Desired effects: Analgesia: The patient reports >50% benefit. Reported improvement in function: The patient reports medication allows her to accomplish basic ADLs. Clinically meaningful improvement in function (CMIF): Sustained CMIF goals met Perceived effectiveness:  Described as relatively effective, allowing for increase in activities of daily living (ADL) Undesirable effects: Side-effects or Adverse reactions: None reported Historical Monitoring: The patient  reports no history of drug use. List of all UDS Test(s): Lab Results  Component Value Date   MDMA NONE DETECTED 06/25/2018   COCAINSCRNUR NONE DETECTED 08/22/2018   COCAINSCRNUR NOT DONE (A) 08/21/2018   COCAINSCRNUR NONE DETECTED 06/25/2018   PCPSCRNUR NONE DETECTED 06/25/2018   THCU NONE DETECTED 08/22/2018   THCU NOT DONE (A) 08/21/2018   THCU NONE DETECTED 06/25/2018   ETH <10 08/21/2018   ETH <10 06/25/2018   ETH <5 04/18/2016   List of other Serum/Urine Drug Screening Test(s):  Lab Results  Component Value Date   COCAINSCRNUR NONE DETECTED 08/22/2018   COCAINSCRNUR NOT DONE (A) 08/21/2018   COCAINSCRNUR NONE DETECTED 06/25/2018   THCU NONE DETECTED 08/22/2018   THCU NOT DONE (A) 08/21/2018   THCU NONE DETECTED 06/25/2018   ETH <10 08/21/2018   ETH <10 06/25/2018   ETH <5 04/18/2016   Historical Background Evaluation: [place] PMP: PDMP not reviewed this encounter. Six (6) year initial data search conducted.             PMP NARX Score Report:  Narcotic: *** Sedative: *** Stimulant: *** [place] Department of public safety, offender search: Editor, commissioning Information) Non-contributory Risk Assessment Profile: Aberrant behavior: None observed or detected today Risk factors for fatal opioid overdose: None identified today PMP NARX Overdose Risk Score: *** Fatal overdose hazard ratio (HR): Calculation deferred Non-fatal overdose hazard ratio (HR): Calculation deferred Risk of opioid abuse or dependence: 0.7-3.0% with doses ? 36 MME/day and 6.1-26% with doses ? 120 MME/day. Substance use disorder (SUD) risk level: See below Personal History of Substance Abuse (SUD-Substance use disorder):  Alcohol:    Illegal Drugs:    Rx Drugs:    ORT Risk Level calculation:  ORT Scoring  interpretation table:  Score <3 = Low Risk for SUD  Score between 4-7 = Moderate Risk for SUD  Score >8 = High Risk for Opioid Abuse   Pharmacologic Plan: As per protocol, I have not taken over any controlled substance management, pending the results of ordered tests and/or consults.            Initial impression: Pending review of available data and ordered tests.  Meds   Current Outpatient Medications:  .  alprazolam (XANAX) 2 MG tablet, Take 2 mg by mouth 2 (two) times daily as needed for anxiety., Disp: , Rfl:  .  atorvastatin (LIPITOR) 40 MG tablet, Take 40 mg by mouth daily., Disp: , Rfl:  .  Butalbital-APAP-Caffeine 50-300-40 MG CAPS, Take 1 capsule by mouth every 8 (eight) hours as needed (migraine). , Disp: , Rfl:  .  cloNIDine (CATAPRES) 0.2 MG tablet, Take 0.2 mg by mouth 2 (two) times daily., Disp: , Rfl:  .  clopidogrel (PLAVIX) 75 MG tablet, Take 1 tablet (75 mg total) by mouth daily., Disp: 90 tablet, Rfl: 3 .  etodolac (LODINE) 300 MG capsule, Take 1 capsule (300 mg total) by mouth 3 (three) times daily as needed (pain)., Disp: 21 capsule, Rfl: 0 .  FLUoxetine (PROZAC) 40 MG capsule, Take 40 mg by mouth daily. , Disp: , Rfl:  .  Galcanezumab-gnlm (EMGALITY) 120 MG/ML SOSY, Inject 120 mg into the skin every 30 (thirty) days., Disp: , Rfl:  .  glipiZIDE (GLUCOTROL) 5 MG tablet, Take 1 tablet (5 mg total) by mouth 2 (two) times daily before a meal., Disp: 60 tablet, Rfl: 0 .  hydrALAZINE (APRESOLINE) 50 MG tablet, Take 50 mg by mouth 3 (three) times daily., Disp: , Rfl:  .  loratadine (CLARITIN) 10 MG tablet, Take 10 mg by mouth daily., Disp: , Rfl:  .  losartan (COZAAR) 100 MG tablet, Take 100 mg by mouth daily., Disp: , Rfl:  .  omeprazole (PRILOSEC) 40 MG capsule, Take 40 mg by mouth daily., Disp: , Rfl:  .  zolpidem (AMBIEN) 10 MG tablet, Take 10 mg by mouth at bedtime. , Disp: , Rfl:   ROS  Cardiovascular: {Hx; Cardiovascular History:210120525} Pulmonary or Respiratory:  {Hx; Pumonary and/or Respiratory History:210120523} Neurological: {Hx; Neurological:210120504} Review of Past Neurological Studies:  Results for orders placed or performed during the hospital encounter of 08/21/18  CT Head Wo Contrast   Narrative   CLINICAL DATA:  Altered mental status.  History of stroke.  EXAM: CT HEAD WITHOUT CONTRAST  CT CERVICAL SPINE WITHOUT CONTRAST  TECHNIQUE: Multidetector CT imaging of the head and cervical spine was performed following the standard protocol without intravenous contrast. Multiplanar CT image reconstructions of the cervical spine were also generated.  COMPARISON:  CT HEAD July 22, 2018  FINDINGS: CT HEAD FINDINGS  BRAIN: No intraparenchymal hemorrhage, mass effect nor midline shift. LEFT occipital lobe encephalomalacia with ex vacuo dilatation subjacent ventricle. No acute large vascular territory infarcts. No abnormal extra-axial fluid collections. Basal cisterns are patent.  VASCULAR: Mild calcific atherosclerosis carotid siphon.  SKULL/SOFT TISSUES: No skull fracture. No significant soft tissue swelling.  ORBITS/SINUSES: The included ocular globes and orbital contents are normal.Moderate lobulated paranasal sinus mucosal thickening. Mastoid air cells are well aerated.  OTHER: Life support lines in place. Subcutaneous gas within included face soft tissues. Multiple dental caries.  CT CERVICAL SPINE FINDINGS  ALIGNMENT: Straightened lordosis.  Vertebral bodies in alignment.  SKULL BASE AND VERTEBRAE: Cervical vertebral bodies and  posterior elements are intact. Moderate C5-6 and mild C6-7 disc height loss with endplate spurring compatible with degenerative discs. T1 and T2 Schmorl's nodes. No destructive bony lesions. C1-2 articulation maintained.  SOFT TISSUES AND SPINAL CANAL: Life support lines in place. Mild calcific atherosclerosis carotid bifurcations. RIGHT greater than LEFT anterior neck subcutaneous fat  stranding, possibly postprocedural. Small volume anterior neck subcutaneous gas.  DISC LEVELS: No significant osseous canal stenosis or neural foraminal narrowing.  UPPER CHEST: Lung apices are clear.  OTHER: None.  IMPRESSION: CT HEAD:  1. No acute intracranial process. 2. Stable examination including old LEFT occipital lobe infarct.  CT CERVICAL SPINE:  1. No fracture or malalignment. 2. Life support lines in place.   Electronically Signed   By: Elon Alas M.D.   On: 08/22/2018 00:51   Results for orders placed or performed during the hospital encounter of 05/09/17  CT HEAD W & WO CONTRAST   Narrative   CLINICAL DATA:  Headache. Intermittent weakness. Personal history of stroke.  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 No acute infarct, hemorrhage, or mass lesion is present. The ventricles are of normal size. No significant extra-axial fluid collection is present.  Postcontrast images demonstrate no pathologic enhancement.  Brainstem and cerebellum are normal.  Vascular: No hyperdense vessel or unexpected calcification. Visible vessels are patent.  Skull: Calvarium is unremarkable. No focal lytic or blastic lesions are present.  Sinuses/Orbits: Right maxillary antrostomy is noted. Part right-sided ethmoidectomy is present. There is some residual mucosal thickening within the posterior right ethmoid air cells. Mucosal thickening and a small fluid level is present in the right sphenoid sinus. The left sphenoid sinus is clear. The remaining paranasal sinuses and the mastoid air cells are  clear.  IMPRESSION: 1. Remote left temporal and occipital lobe infarct. 2. No acute intracranial abnormality or pathologic enhancement. 3. Previous right-sided sinus surgery with mild residual mucosal thickening in the remaining ethmoid air cells. 4. Small fluid level and mucosal thickening in the right sphenoid sinus.   Electronically Signed   By: CSan MorelleM.D.   On: 05/09/2017 18:52   Results for orders placed or performed during the hospital encounter of 04/18/16  MR Brain Wo Contrast   Narrative   CLINICAL DATA:  Initial evaluation for acute intracranial hemorrhage.  EXAM: MRI HEAD WITHOUT CONTRAST  MRA HEAD WITHOUT CONTRAST  TECHNIQUE: Multiplanar, multiecho pulse sequences of the brain and surrounding structures were obtained without intravenous contrast. Angiographic images of the head were obtained using MRA technique without contrast.  COMPARISON:  Prior CT from 04/18/2016.  FINDINGS: MRI HEAD FINDINGS  Study mildly degraded by motion artifact.  Cerebral volume within normal limits for patient age. Minimal T2/FLAIR hyperintensity within the periventricular white matter, within normal limits for patient age.  Previously identified acute intraparenchymal hematoma centered at the left temporal occipital region again seen. This measures 4.0 x 2.8 x 2.7 cm. Localized edema within the adjacent left temporal occipital region is similar. Intraventricular extension with blood and knee lateral and third ventricles again seen. Overall volume of intraventricular blood is not significantly changed.  Scattered serpiginous FLAIR signal abnormality overlying the bilateral parietal convexities consistent with a small amount  of associated subarachnoid hemorrhage, this is not significantly changed relative to previous study.  Eight seen subdural hematoma overlies the left parietotemporal convexity, measuring up to 2 mm. Slight extension along  the tentorium.  Trace 3 mm left-to-right shift. No hydrocephalus or ventricular trapping.  No evidence for acute infarct. Gray-white matter differentiation otherwise maintained. Major intracranial vascular flow voids are preserved. No areas of chronic infarction.  No mass lesion identified.  Major dural sinuses are grossly patent.  Craniocervical junction within normal limits. Visualized upper cervical spine unremarkable.  Pituitary gland within normal limits. No acute abnormality about the globes and orbits.  Small retention cyst noted within the left max O sinus. Paranasal sinuses are otherwise clear. No mastoid effusion. Inner ear structures normal.  Bone marrow signal intensity within normal limits. No scalp soft tissue abnormality.  MRA HEAD FINDINGS  ANTERIOR CIRCULATION:  Visualized distal cervical segments of the internal carotid arteries are patent with antegrade flow. Petrous, cavernous, and supraclinoid segments patent without high-grade stenosis. Atheromatous irregularity within the cavernous ICAs bilaterally apparent A1 segments patent, with the right A1 segment slightly dominant. Anterior communicating artery normal. Atheromatous irregularity within the anterior cerebral arteries bilaterally which are opacified to their distal aspects. M1 segments patent without stenosis or occlusion. Multi focal atheromatous irregularity throughout the MCA branches bilaterally, which are fairly symmetric and well perfused. No correctable stenosis within the intracranial circulation.  POSTERIOR CIRCULATION:  Vertebral arteries patent to the vertebrobasilar junction. Multi focal atheromatous irregularity within knee V4 segments without high-grade stenosis. Posterior inferior cerebral arteries patent proximally. Basilar artery somewhat diminutive but well opacified to its distal aspect. Superior cerebral arteries patent proximally. Left PCA arises from the basilar artery  and is well opacified to its distal aspect. Atheromatous regularity throughout the left PCA. There is a fetal type right PCA supplied via the right posterior communicating artery. Right PCA also supplied to its distal aspect, although demonstrates multifocal atheromatous irregularity.  No aneurysm or vascular malformation. No vascular abnormality seen underlying the acute parenchymal hemorrhage.  IMPRESSION: MRI HEAD IMPRESSION:  1. Similar size and appearance of acute right temporal occipital intraparenchymal hematoma measuring 4.0 x 2.8 x 2.7 cm (estimated volume 15 cc). Similar localized edema with trace 3 mm left-to-right shift. 2. Intraventricular extension with blood in the lateral and third ventricles. No evidence for hydrocephalus or ventricular trapping. 3. Small volume acute subarachnoid hemorrhage within the posterior cerebral hemispheres bilaterally, stable from prior CT. 4. Trace left subdural hemorrhage measuring up to 2 mm, difficult to visualize on prior study, but felt to be unchanged.  MRA HEAD IMPRESSION:  1. Negative intracranial MRA. No vascular abnormality identified underlying the acute hemorrhage. 2. No large or proximal arterial branch occlusion. No high-grade or correctable stenosis. 3. Multi focal atheromatous irregularity involving the anterior posterior circulations as above, predominantly involving the distal small vessels.   Electronically Signed   By: Jeannine Boga M.D.   On: 04/20/2016 03:52   MR MRA HEAD WO CONTRAST   Narrative   CLINICAL DATA:  Initial evaluation for acute intracranial hemorrhage.  EXAM: MRI HEAD WITHOUT CONTRAST  MRA HEAD WITHOUT CONTRAST  TECHNIQUE: Multiplanar, multiecho pulse sequences of the brain and surrounding structures were obtained without intravenous contrast. Angiographic images of the head were obtained using MRA technique without contrast.  COMPARISON:  Prior CT from  04/18/2016.  FINDINGS: MRI HEAD FINDINGS  Study mildly degraded by motion artifact.  Cerebral volume within normal limits for patient age. Minimal T2/FLAIR hyperintensity within the periventricular  white matter, within normal limits for patient age.  Previously identified acute intraparenchymal hematoma centered at the left temporal occipital region again seen. This measures 4.0 x 2.8 x 2.7 cm. Localized edema within the adjacent left temporal occipital region is similar. Intraventricular extension with blood and knee lateral and third ventricles again seen. Overall volume of intraventricular blood is not significantly changed.  Scattered serpiginous FLAIR signal abnormality overlying the bilateral parietal convexities consistent with a small amount of associated subarachnoid hemorrhage, this is not significantly changed relative to previous study.  Eight seen subdural hematoma overlies the left parietotemporal convexity, measuring up to 2 mm. Slight extension along the tentorium.  Trace 3 mm left-to-right shift. No hydrocephalus or ventricular trapping.  No evidence for acute infarct. Gray-white matter differentiation otherwise maintained. Major intracranial vascular flow voids are preserved. No areas of chronic infarction.  No mass lesion identified.  Major dural sinuses are grossly patent.  Craniocervical junction within normal limits. Visualized upper cervical spine unremarkable.  Pituitary gland within normal limits. No acute abnormality about the globes and orbits.  Small retention cyst noted within the left max O sinus. Paranasal sinuses are otherwise clear. No mastoid effusion. Inner ear structures normal.  Bone marrow signal intensity within normal limits. No scalp soft tissue abnormality.  MRA HEAD FINDINGS  ANTERIOR CIRCULATION:  Visualized distal cervical segments of the internal carotid arteries are patent with antegrade flow. Petrous, cavernous, and  supraclinoid segments patent without high-grade stenosis. Atheromatous irregularity within the cavernous ICAs bilaterally apparent A1 segments patent, with the right A1 segment slightly dominant. Anterior communicating artery normal. Atheromatous irregularity within the anterior cerebral arteries bilaterally which are opacified to their distal aspects. M1 segments patent without stenosis or occlusion. Multi focal atheromatous irregularity throughout the MCA branches bilaterally, which are fairly symmetric and well perfused. No correctable stenosis within the intracranial circulation.  POSTERIOR CIRCULATION:  Vertebral arteries patent to the vertebrobasilar junction. Multi focal atheromatous irregularity within knee V4 segments without high-grade stenosis. Posterior inferior cerebral arteries patent proximally. Basilar artery somewhat diminutive but well opacified to its distal aspect. Superior cerebral arteries patent proximally. Left PCA arises from the basilar artery and is well opacified to its distal aspect. Atheromatous regularity throughout the left PCA. There is a fetal type right PCA supplied via the right posterior communicating artery. Right PCA also supplied to its distal aspect, although demonstrates multifocal atheromatous irregularity.  No aneurysm or vascular malformation. No vascular abnormality seen underlying the acute parenchymal hemorrhage.  IMPRESSION: MRI HEAD IMPRESSION:  1. Similar size and appearance of acute right temporal occipital intraparenchymal hematoma measuring 4.0 x 2.8 x 2.7 cm (estimated volume 15 cc). Similar localized edema with trace 3 mm left-to-right shift. 2. Intraventricular extension with blood in the lateral and third ventricles. No evidence for hydrocephalus or ventricular trapping. 3. Small volume acute subarachnoid hemorrhage within the posterior cerebral hemispheres bilaterally, stable from prior CT. 4. Trace left subdural  hemorrhage measuring up to 2 mm, difficult to visualize on prior study, but felt to be unchanged.  MRA HEAD IMPRESSION:  1. Negative intracranial MRA. No vascular abnormality identified underlying the acute hemorrhage. 2. No large or proximal arterial branch occlusion. No high-grade or correctable stenosis. 3. Multi focal atheromatous irregularity involving the anterior posterior circulations as above, predominantly involving the distal small vessels.   Electronically Signed   By: Jeannine Boga M.D.   On: 04/20/2016 03:52    Psychological-Psychiatric: {Hx; Psychological-Psychiatric History:210120512} Gastrointestinal: {Hx; Gastrointestinal:210120527} Genitourinary: {Hx; Genitourinary:210120506} Hematological: {Hx; Hematological:210120510} Endocrine: {  Hx; Endocrine history:210120509} Rheumatologic: {Hx; Rheumatological:210120530} Musculoskeletal: {Hx; Musculoskeletal:210120528} Work History: {Hx; Work history:210120514}  Allergies  Stephanie [name] is allergic to gabapentin; tylenol [acetaminophen]; aspirin; buprenorphine hcl; codeine; compazine; ioxaglate; ivp dye [iodinated diagnostic agents]; metrizamide; naproxen; norco [hydrocodone-acetaminophen]; penicillin g; prochlorperazine maleate; reglan [metoclopramide]; sulfur; tegretol [carbamazepine]; toradol [ketorolac tromethamine]; tramadol; and zofran [ondansetron hcl].  Laboratory Chemistry  Inflammation Markers (CRP: Acute Phase) (ESR: Chronic Phase) Lab Results  Component Value Date   CRP 1.3 01/09/2017   ESRSEDRATE 7 01/09/2017   LATICACIDVEN 1.45 08/21/2018                         Rheumatology Markers Lab Results  Component Value Date   RF 10.5 06/27/2016                        Renal Function Markers Lab Results  Component Value Date   BUN 7 01/16/2019   CREATININE 0.60 01/16/2019   BCR 17 06/27/2016   GFRAA >60 01/16/2019   GFRNONAA >60 01/16/2019                             Hepatic Function  Markers Lab Results  Component Value Date   AST 22 01/16/2019   ALT 25 01/16/2019   ALBUMIN 3.9 01/16/2019   ALKPHOS 111 01/16/2019   LIPASE 26 01/16/2019   AMMONIA 20 04/27/2016                        Electrolytes Lab Results  Component Value Date   NA 139 01/16/2019   K 4.1 01/16/2019   CL 104 01/16/2019   CALCIUM 9.5 01/16/2019   MG 2.1 11/20/2018   PHOS 2.3 (L) 08/22/2018                        Neuropathy Markers Lab Results  Component Value Date   HGBA1C 8.1 (H) 04/22/2016   HIV Non Reactive 08/22/2017                        CNS Tests No results found for: COLORCSF, APPEARCSF, RBCCOUNTCSF, WBCCSF, POLYSCSF, LYMPHSCSF, EOSCSF, PROTEINCSF, GLUCCSF, JCVIRUS, CSFOLI, IGGCSF, LABACHR, ACETBL                      Bone Pathology Markers No results found for: Marveen Reeks, [id number], [id number], 25OHVITD1, 25OHVITD2, 25OHVITD3, TESTOFREE, TESTOSTERONE                       Coagulation Parameters Lab Results  Component Value Date   INR 0.97 08/21/2018   LABPROT 12.8 08/21/2018   APTT 30 06/21/2016   PLT 223 01/16/2019   DDIMER 0.45 01/16/2019                        Cardiovascular Markers Lab Results  Component Value Date   BNP 45.9 08/21/2018   CKTOTAL 137 08/22/2018   TROPONINI <0.03 08/22/2018   HGB 14.6 01/16/2019   HCT 46.0 01/16/2019                         ID Markers Lab Results  Component Value Date   HIV Non Reactive 08/22/2017  CA Markers No results found for: CEA, CA125, LABCA2                      Endocrine Markers Lab Results  Component Value Date   TSH 1.241 08/21/2018   FREET4 0.92 04/18/2016                        Note: Lab results reviewed.  Imaging Review  Cervical Imaging: Cervical MR wo contrast: No results found for this or any previous visit. Cervical MR wo contrast: No procedure found. Cervical MR w/wo contrast: No results found for this or any previous visit. Cervical MR w contrast: No results  found for this or any previous visit. Cervical CT wo contrast:  Results for orders placed during the hospital encounter of 08/21/18  CT Cervical Spine Wo Contrast   Narrative CLINICAL DATA:  Altered mental status.  History of stroke.  EXAM: CT HEAD WITHOUT CONTRAST  CT CERVICAL SPINE WITHOUT CONTRAST  TECHNIQUE: Multidetector CT imaging of the head and cervical spine was performed following the standard protocol without intravenous contrast. Multiplanar CT image reconstructions of the cervical spine were also generated.  COMPARISON:  CT HEAD July 22, 2018  FINDINGS: CT HEAD FINDINGS  BRAIN: No intraparenchymal hemorrhage, mass effect nor midline shift. LEFT occipital lobe encephalomalacia with ex vacuo dilatation subjacent ventricle. No acute large vascular territory infarcts. No abnormal extra-axial fluid collections. Basal cisterns are patent.  VASCULAR: Mild calcific atherosclerosis carotid siphon.  SKULL/SOFT TISSUES: No skull fracture. No significant soft tissue swelling.  ORBITS/SINUSES: The included ocular globes and orbital contents are normal.Moderate lobulated paranasal sinus mucosal thickening. Mastoid air cells are well aerated.  OTHER: Life support lines in place. Subcutaneous gas within included face soft tissues. Multiple dental caries.  CT CERVICAL SPINE FINDINGS  ALIGNMENT: Straightened lordosis.  Vertebral bodies in alignment.  SKULL BASE AND VERTEBRAE: Cervical vertebral bodies and posterior elements are intact. Moderate C5-6 and mild C6-7 disc height loss with endplate spurring compatible with degenerative discs. T1 and T2 Schmorl's nodes. No destructive bony lesions. C1-2 articulation maintained.  SOFT TISSUES AND SPINAL CANAL: Life support lines in place. Mild calcific atherosclerosis carotid bifurcations. RIGHT greater than LEFT anterior neck subcutaneous fat stranding, possibly postprocedural. Small volume anterior neck subcutaneous  gas.  DISC LEVELS: No significant osseous canal stenosis or neural foraminal narrowing.  UPPER CHEST: Lung apices are clear.  OTHER: None.  IMPRESSION: CT HEAD:  1. No acute intracranial process. 2. Stable examination including old LEFT occipital lobe infarct.  CT CERVICAL SPINE:  1. No fracture or malalignment. 2. Life support lines in place.   Electronically Signed   By: Elon Alas M.D.   On: 08/22/2018 00:51    Cervical CT w/wo contrast: No results found for this or any previous visit. Cervical CT w/wo contrast: No results found for this or any previous visit. Cervical CT w contrast: No results found for this or any previous visit. Cervical CT outside: No results found for this or any previous visit. Cervical DG 1 view: No results found for this or any previous visit. Cervical DG 2-3 views: No results found for this or any previous visit. Cervical DG F/E views: No results found for this or any previous visit. Cervical DG 2-3 clearing views: No results found for this or any previous visit. Cervical DG Bending/F/E views: No results found for this or any previous visit. Cervical DG complete: No results found for this or  any previous visit. Cervical DG Myelogram views: No results found for this or any previous visit. Cervical DG Myelogram views: No results found for this or any previous visit. Cervical Discogram views: No results found for this or any previous visit.  Shoulder Imaging: Shoulder-R MR w contrast: No results found for this or any previous visit. Shoulder-L MR w contrast: No results found for this or any previous visit. Shoulder-R MR w/wo contrast: No results found for this or any previous visit. Shoulder-L MR w/wo contrast: No results found for this or any previous visit. Shoulder-R MR wo contrast: No results found for this or any previous visit. Shoulder-L MR wo contrast: No results found for this or any previous visit. Shoulder-R CT w contrast: No  results found for this or any previous visit. Shoulder-L CT w contrast: No results found for this or any previous visit. Shoulder-R CT w/wo contrast: No results found for this or any previous visit. Shoulder-L CT w/wo contrast: No results found for this or any previous visit. Shoulder-R CT wo contrast: No results found for this or any previous visit. Shoulder-L CT wo contrast: No results found for this or any previous visit. Shoulder-R DG Arthrogram: No results found for this or any previous visit. Shoulder-L DG Arthrogram: No results found for this or any previous visit. Shoulder-R DG 1 view: No results found for this or any previous visit. Shoulder-L DG 1 view: No results found for this or any previous visit. Shoulder-R DG:  Results for orders placed during the hospital encounter of 04/18/17  DG Shoulder Right   Narrative CLINICAL DATA:  Right shoulder pain since the surgery for a proximal humerus fracture 1 month ago.  EXAM: RIGHT SHOULDER - 2+ VIEW  COMPARISON:  01/18/2017 and 03/11/2017.  FINDINGS: Intramedullary rod and screw fixation of the previously demonstrated comminuted proximal humerus fracture. The fracture lines are still visible with no interval bridging callus. No acute fracture or dislocation seen.  IMPRESSION: Stable hardware fixation of a comminuted proximal right humerus fracture with no interval healing.   Electronically Signed   By: Claudie Revering M.D.   On: 04/18/2017 18:31    Shoulder-L DG: No results found for this or any previous visit.  Thoracic Imaging: Thoracic MR wo contrast: No results found for this or any previous visit. Thoracic MR wo contrast: No procedure found. Thoracic MR w/wo contrast: No results found for this or any previous visit. Thoracic MR w contrast: No results found for this or any previous visit. Thoracic CT wo contrast: No results found for this or any previous visit. Thoracic CT w/wo contrast: No results found for this or  any previous visit. Thoracic CT w/wo contrast: No results found for this or any previous visit. Thoracic CT w contrast: No results found for this or any previous visit. Thoracic DG 2-3 views:  Results for orders placed during the hospital encounter of 02/14/18  DG Thoracic Spine 2 View   Narrative CLINICAL DATA:  Status post fall few days ago with back pain  EXAM: THORACIC SPINE 2 VIEWS  COMPARISON:  Chest x-ray April 11, 2017  FINDINGS: There is no evidence of thoracic spine fracture. Scoliosis of the spine is noted. Degenerative joint changes with anterior osteophytosis and narrowed joint space are identified.  IMPRESSION: No acute fracture or dislocation. Osteoarthritic changes of thoracic spine.   Electronically Signed   By: Abelardo Diesel M.D.   On: 02/14/2018 16:17    Thoracic DG 4 views: No results found for this or  any previous visit. Thoracic DG: No results found for this or any previous visit. Thoracic DG w/swimmers view:  Results for orders placed during the hospital encounter of 03/03/18  [facility] Thoracic Spine W/Swimmers   Narrative CLINICAL DATA:  Pain after fall today.  EXAM: THORACIC SPINE - 3 VIEWS  COMPARISON:  None.  FINDINGS: There is no evidence of thoracic spine fracture. There are 12 ribbed thoracic vertebrae. Mild multilevel disc flattening is noted with small anterior osteophytes compatible mild degenerative change of the thoracic spine. No acute fracture, malalignment nor bone destruction.  IMPRESSION: Mild degenerative change of the thoracic spine without acute osseous appearing abnormality.   Electronically Signed   By: Ashley Royalty M.D.   On: 03/03/2018 19:29    Thoracic DG Myelogram views: No results found for this or any previous visit. Thoracic DG Myelogram views: No results found for this or any previous visit.  Lumbosacral Imaging: Lumbar MR wo contrast: No results found for this or any previous visit. Lumbar MR wo contrast: No  procedure found. Lumbar MR w/wo contrast: No results found for this or any previous visit. Lumbar MR w/wo contrast: No results found for this or any previous visit. Lumbar MR w contrast: No results found for this or any previous visit. Lumbar CT wo contrast: No results found for this or any previous visit. Lumbar CT w/wo contrast: No results found for this or any previous visit. Lumbar CT w/wo contrast: No results found for this or any previous visit. Lumbar CT w contrast: No results found for this or any previous visit. Lumbar DG 1V: No results found for this or any previous visit. Lumbar DG 1V (Clearing): No results found for this or any previous visit. Lumbar DG 2-3V (Clearing): No results found for this or any previous visit. Lumbar DG 2-3 views: No results found for this or any previous visit. Lumbar DG (Complete) 4+V:  Results for orders placed during the hospital encounter of 03/03/18  DG Lumbar Spine Complete   Narrative CLINICAL DATA:  Patient fell down 2 stairs at home today.  Pain.  EXAM: LUMBAR SPINE - COMPLETE 4+ VIEW  COMPARISON:  None.  FINDINGS: There are 5 non ribbed lumbar vertebrae in normal lumbar lordosis. No acute lumbar spine fracture, pars defects or listhesis. Moderate disc space narrowing is identified at L5-S1. Probable old limbus vertebra of L5 along the anterior superior aspect. Facet sclerosis from L3 through S1 compatible with degenerative facet arthropathy.  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 No acute fracture or listhesis.   Electronically Signed   By: Ashley Royalty M.D.   On: 03/03/2018 19:27          Lumbar DG F/E views: No results found for this or any previous visit.       Lumbar DG Bending views: No results found for this or any previous visit.       Lumbar DG Myelogram views: No results found for this or any previous visit. Lumbar DG Myelogram: No results found for this or any previous  visit. Lumbar DG Myelogram: No results found for this or any previous visit. Lumbar DG Myelogram: No results found for this or any previous visit. Lumbar DG Myelogram Lumbosacral: No results found for this or any previous visit. Lumbar DG Diskogram views: No results found for this or any previous visit. Lumbar DG Diskogram views: No results found for this or any previous visit. Lumbar DG Epidurogram OP: No results found for this or  any previous visit. Lumbar DG Epidurogram IP: No results found for this or any previous visit.  Sacroiliac Joint Imaging: Sacroiliac Joint DG: No results found for this or any previous visit. Sacroiliac Joint MR w/wo contrast: No results found for this or any previous visit. Sacroiliac Joint MR wo contrast: No results found for this or any previous visit.  Spine Imaging: Whole Spine DG Myelogram views: No results found for this or any previous visit. Whole Spine MR Mets screen: No results found for this or any previous visit. Whole Spine MR Mets screen: No results found for this or any previous visit. Whole Spine MR w/wo: No results found for this or any previous visit. MRA Spinal Canal w/ cm: No results found for this or any previous visit. MRA Spinal Canal wo/ cm: No procedure found. MRA Spinal Canal w/wo cm: No results found for this or any previous visit. Spine Outside MR Films: No results found for this or any previous visit. Spine Outside CT Films: No results found for this or any previous visit. CT-Guided Biopsy: No results found for this or any previous visit. CT-Guided Needle Placement: No results found for this or any previous visit. DG Spine outside: No results found for this or any previous visit. IR Spine outside: No results found for this or any previous visit. NM Spine outside: No results found for this or any previous visit.  Hip Imaging: Hip-R MR w contrast: No results found for this or any previous visit. Hip-L MR w contrast: No results  found for this or any previous visit. Hip-R MR w/wo contrast: No results found for this or any previous visit. Hip-L MR w/wo contrast: No results found for this or any previous visit. Hip-R MR wo contrast: No results found for this or any previous visit. Hip-L MR wo contrast: No results found for this or any previous visit. Hip-R CT w contrast: No results found for this or any previous visit. Hip-L CT w contrast: No results found for this or any previous visit. Hip-R CT w/wo contrast: No results found for this or any previous visit. Hip-L CT w/wo contrast: No results found for this or any previous visit. Hip-R CT wo contrast: No results found for this or any previous visit. Hip-L CT wo contrast: No results found for this or any previous visit. Hip-R DG 2-3 views: No results found for this or any previous visit. Hip-L DG 2-3 views: No results found for this or any previous visit. Hip-R DG Arthrogram: No results found for this or any previous visit. Hip-L DG Arthrogram: No results found for this or any previous visit. Hip-B DG Bilateral: No results found for this or any previous visit.  Knee Imaging: Knee-R MR w contrast: No results found for this or any previous visit. Knee-L MR w contrast: No results found for this or any previous visit. Knee-R MR w/wo contrast: No results found for this or any previous visit. Knee-L MR w/wo contrast: No results found for this or any previous visit. Knee-R MR wo contrast: No results found for this or any previous visit. Knee-L MR wo contrast: No results found for this or any previous visit. Knee-R CT w contrast: No results found for this or any previous visit. Knee-L CT w contrast: No results found for this or any previous visit. Knee-R CT w/wo contrast: No results found for this or any previous visit. Knee-L CT w/wo contrast: No results found for this or any previous visit. Knee-R CT wo contrast: No results  found for this or any previous visit. Knee-L  CT wo contrast: No results found for this or any previous visit. Knee-R DG 1-2 views: No results found for this or any previous visit. Knee-L DG 1-2 views: No results found for this or any previous visit. Knee-R DG 3 views: No results found for this or any previous visit. Knee-L DG 3 views: No results found for this or any previous visit. Knee-R DG 4 views: No results found for this or any previous visit. Knee-L DG 4 views: No results found for this or any previous visit. Knee-R DG Arthrogram: No results found for this or any previous visit. Knee-L DG Arthrogram: No results found for this or any previous visit.  Ankle Imaging: Ankle-R DG Complete: No results found for this or any previous visit. Ankle-L DG Complete: No results found for this or any previous visit.  Foot Imaging: Foot-R DG Complete: No results found for this or any previous visit. Foot-L DG Complete:  Results for orders placed during the hospital encounter of 04/10/09  DG Foot Complete Left   Narrative Clinical Data: Left foot pain.   LEFT FOOT - COMPLETE 3+ VIEW   Comparison: None   Findings: Three views of the left foot were obtained.  There is some irregularity along the lateral aspect of the fifth toe PIP joint which could be degenerative in etiology.  Otherwise, there are no acute bony abnormalities.  Normal alignment of the left foot.   IMPRESSION: No acute bony abnormality to the left foot.   Probable degenerative changes of the fifth toe PIP joint.  Provider: Marcelino Scot, [place] Collar    Elbow Imaging: Elbow-R DG Complete: No results found for this or any previous visit. Elbow-L DG Complete: No results found for this or any previous visit.  Wrist Imaging: Wrist-R DG Complete: No results found for this or any previous visit. Wrist-L DG Complete:  Results for orders placed during the hospital encounter of 07/30/13  DG Wrist Complete Left   Narrative CLINICAL DATA:  [place] catching self with left  hand, left hand and wrist pain  EXAM: LEFT WRIST - COMPLETE 3+ VIEW  COMPARISON:  None  FINDINGS: Osseous mineralization grossly normal for technique.  Joint spaces preserved.  No acute fracture, dislocation or bone destruction.  IMPRESSION: No acute osseous abnormalities.   Electronically Signed   By: Lavonia Dana M.D.   On: 07/30/2013 17:04     Hand Imaging: Hand-R DG Complete: No results found for this or any previous visit. Hand-L DG Complete:  Results for orders placed during the hospital encounter of 10/21/17  DG Hand Complete Left   Narrative CLINICAL DATA:  Left hand pain after fall yesterday.  EXAM: LEFT HAND - COMPLETE 3+ VIEW  COMPARISON:  Third digit radiograph earlier this day at 1947 hour. Hand radiograph 12/17/2013  FINDINGS: There is no evidence of acute fracture or dislocation. Linear lucency near the tip of the third digit distal phalanx is unchanged from 2015 exam. No significant arthropathy. Soft tissues are unremarkable.  IMPRESSION: No fracture or acute osseous abnormality of the left hand.   Electronically Signed   By: Jeb Levering M.D.   On: 10/21/2017 23:28     Complexity Note: Imaging results reviewed. Results shared with [name], using [employer].                         [place]  Drug: Stephanie [name]  reports no history of drug  use. Alcohol:  reports previous alcohol use. Tobacco:  reports that she has been smoking cigarettes. She has a 1.50 pack-year smoking history. She has never used smokeless tobacco. Medical:  has a past medical history of Diabetes mellitus without complication ([place]), Difficult intubation, Diverticulitis, Headache, Hepatitis C, History of kidney stones, Hypertension, IBS (irritable bowel syndrome), Kidney stones, Sciatica, Sleep apnea, and Stroke ([place]) (2017). Family: family history includes Cancer in her sister; Diabetes in her father; Heart failure in her mother.  Past Surgical History:   Procedure Laterality Date  . ABDOMINAL HYSTERECTOMY    . carpel tunnel syndrome  2017  . HARDWARE REMOVAL Right 12/22/2017   Procedure: HARDWARE REMOVAL-RIGHT ARM;  Surgeon: Lovell Sheehan, MD;  Location: [facility] ORS;  Service: Orthopedics;  Laterality: Right;  Removal of implant, superficial right humerus  . HUMERUS IM NAIL Right 03/11/2017   Procedure: INTRAMEDULLARY (IM) NAIL HUMERAL;  Surgeon: Lovell Sheehan, MD;  Location: [facility] ORS;  Service: Orthopedics;  Laterality: Right;  . KIDNEY STONE SURGERY Right 02/12/12  . KIDNEY STONE SURGERY Left 07/15/2012  . LIGATION OF ARTERIOVENOUS  FISTULA Left 06/21/2016   Procedure: LIGATION OF ARTERIOVENOUS  FISTULA ( LIGATION BASILIC VEIN );  Surgeon: Katha Cabal, MD;  Location: [facility] ORS;  Service: Vascular;  Laterality: Left;  . LITHOTRIPSY    . OOPHORECTOMY    . SINUS EXPLORATION    . TONSILLECTOMY     Active Ambulatory Problems    Diagnosis Date Noted  . Hepatitis C   . IVH (intraventricular hemorrhage) ([place]) 04/18/2016  . Essential hypertension, malignant 04/19/2016  . Cytotoxic brain edema ([place]) 04/19/2016  . ICH (intracerebral hemorrhage) ([place])   . Pain and swelling of left upper extremity 07/22/2016  . Superficial venous thrombosis of arm, left 07/22/2016  . Small bowel obstruction ([place]) 09/02/2016  . Headache 01/07/2017  . UTI (urinary tract infection) 08/21/2017  . Nausea & vomiting 08/21/2017  . Nicotine dependence 08/21/2017  . Shoulder pain, right 12/22/2017  . Acute encephalopathy 08/21/2018  . Hypotension 08/22/2018  . Bradycardia 08/22/2018  . Type 2 diabetes mellitus ([place]) 08/22/2018   Resolved Ambulatory Problems    Diagnosis Date Noted  . Cerebral venous thrombosis    Past Medical History:  Diagnosis Date  . Diabetes mellitus without complication ([place])   . Difficult intubation   . Diverticulitis   . History of kidney stones   . Hypertension   . IBS (irritable bowel syndrome)   . Kidney stones   . Sciatica    . Sleep apnea   . Stroke [facility]) 2017   Assessment  Primary Diagnosis & Pertinent Problem List: There were no encounter diagnoses.  Visit Diagnosis (New problems to examiner): No diagnosis found. Plan of Care (Initial workup plan)  Note: Stephanie [name] was reminded that as per protocol, today's visit has been an evaluation only. We have not taken over the patient's controlled substance management.  Problem-specific plan: No problem-specific Assessment & Plan notes found for this encounter.  Lab Orders  No laboratory test(s) ordered today   Imaging Orders  No imaging studies ordered today   Referral Orders  No referral(s) requested today   Procedure Orders    No procedure(s) ordered today   Pharmacotherapy (current): Medications ordered:  No orders of the defined types were placed in this encounter.  Medications administered during this visit: Stephanie [name] had no medications administered during this visit.   Pharmacological management options:  Opioid Analgesics: The patient was informed that there is  no guarantee that she would be a candidate for opioid analgesics. The decision will be made following CDC guidelines. This decision will be based on the results of diagnostic studies, as well as Stephanie [name]'s risk profile.   Membrane stabilizer: To be determined at a later time  Muscle relaxant: To be determined at a later time  NSAID: To be determined at a later time  Other analgesic(s): To be determined at a later time   Interventional management options: Stephanie [name] was informed that there is no guarantee that she would be a candidate for interventional therapies. The decision will be based on the results of diagnostic studies, as well as Stephanie [name] risk profile.  Procedure(s) under consideration:  ***   Provider-requested follow-up: No follow-ups on file.  Future Appointments  Date Time Provider [place]  02/08/2019 11:30 AM Gillis Santa, MD  [facility] None    Total duration of non-face-to-face encounter: *** minutes.  Primary Care Physician: [name], NP Location: [facility] Note by: Gillis Santa, MD Date: 02/08/2019; Time: 3:40 PM  Note: This dictation was prepared with Dragon dictation. Any transcriptional errors that may result from this process are unintentional.

## 2019-02-05 ENCOUNTER — Other Ambulatory Visit: Payer: Self-pay

## 2019-02-05 ENCOUNTER — Emergency Department (HOSPITAL_COMMUNITY): Payer: No Typology Code available for payment source

## 2019-02-05 ENCOUNTER — Encounter (HOSPITAL_COMMUNITY): Payer: Self-pay | Admitting: Emergency Medicine

## 2019-02-05 ENCOUNTER — Emergency Department (HOSPITAL_COMMUNITY)
Admission: EM | Admit: 2019-02-05 | Discharge: 2019-02-05 | Disposition: A | Discharge status: Home / Self Care | Payer: No Typology Code available for payment source | Attending: Emergency Medicine | Admitting: Emergency Medicine

## 2019-02-05 DIAGNOSIS — E119 Type 2 diabetes mellitus without complications: Secondary | ICD-10-CM | POA: Insufficient documentation

## 2019-02-05 DIAGNOSIS — Z79899 Other long term (current) drug therapy: Secondary | ICD-10-CM | POA: Insufficient documentation

## 2019-02-05 DIAGNOSIS — F17210 Nicotine dependence, cigarettes, uncomplicated: Secondary | ICD-10-CM | POA: Insufficient documentation

## 2019-02-05 DIAGNOSIS — Z7901 Long term (current) use of anticoagulants: Secondary | ICD-10-CM | POA: Insufficient documentation

## 2019-02-05 DIAGNOSIS — I10 Essential (primary) hypertension: Secondary | ICD-10-CM | POA: Insufficient documentation

## 2019-02-05 DIAGNOSIS — R51 Headache: Secondary | ICD-10-CM | POA: Insufficient documentation

## 2019-02-05 DIAGNOSIS — Z8673 Personal history of transient ischemic attack (TIA), and cerebral infarction without residual deficits: Secondary | ICD-10-CM | POA: Diagnosis not present

## 2019-02-05 DIAGNOSIS — R519 Headache, unspecified: Secondary | ICD-10-CM

## 2019-02-05 LAB — CBC WITH DIFFERENTIAL/PLATELET
Abs Immature Granulocytes: 0.02 10*3/uL (ref 0.00–0.07)
Basophils Absolute: 0 10*3/uL (ref 0.0–0.1)
Basophils Relative: 1 %
Eosinophils Absolute: 0.2 10*3/uL (ref 0.0–0.5)
Eosinophils Relative: 3 %
HCT: 41.1 % (ref 36.0–46.0)
Hemoglobin: 12.7 g/dL (ref 12.0–15.0)
Immature Granulocytes: 0 %
Lymphocytes Relative: 42 %
Lymphs Abs: 2.7 10*3/uL (ref 0.7–4.0)
MCH: 30 pg (ref 26.0–34.0)
MCHC: 30.9 g/dL (ref 30.0–36.0)
MCV: 96.9 fL (ref 80.0–100.0)
Monocytes Absolute: 0.7 10*3/uL (ref 0.1–1.0)
Monocytes Relative: 11 %
Neutro Abs: 2.8 10*3/uL (ref 1.7–7.7)
Neutrophils Relative %: 43 %
Platelets: 209 10*3/uL (ref 150–400)
RBC: 4.24 MIL/uL (ref 3.87–5.11)
RDW: 15.1 % (ref 11.5–15.5)
WBC: 6.4 10*3/uL (ref 4.0–10.5)
nRBC: 0 % (ref 0.0–0.2)

## 2019-02-05 LAB — BASIC METABOLIC PANEL
Anion gap: 5 (ref 5–15)
BUN: 10 mg/dL (ref 6–20)
CO2: 26 mmol/L (ref 22–32)
Calcium: 9 mg/dL (ref 8.9–10.3)
Chloride: 108 mmol/L (ref 98–111)
Creatinine, Ser: 0.65 mg/dL (ref 0.44–1.00)
GFR calc Af Amer: >60 mL/min (ref >60)
GFR calc non Af Amer: >60 mL/min (ref >60)
Glucose, Bld: 96 mg/dL (ref 70–99)
Potassium: 3.7 mmol/L (ref 3.5–5.1)
Sodium: 139 mmol/L (ref 135–145)

## 2019-02-05 MED ORDER — HYDROMORPHONE HCL 1 MG/ML IJ SOLN
0.5000 mg | Freq: Once | INTRAMUSCULAR | Status: AC
  Administered 2019-02-05: 0.5 mg via INTRAVENOUS
  Filled 2019-02-05: qty 1

## 2019-02-05 MED ORDER — DROPERIDOL 2.5 MG/ML IJ SOLN: 2.5000 mg | Freq: Once | INTRAMUSCULAR | Status: DC

## 2019-02-05 MED ORDER — HYDROMORPHONE HCL 1 MG/ML IJ SOLN
1.0000 mg | Freq: Once | INTRAMUSCULAR | Status: AC
  Administered 2019-02-05: 1 mg via INTRAVENOUS
  Filled 2019-02-05: qty 1

## 2019-02-05 MED ORDER — MORPHINE SULFATE (PF) 4 MG/ML IV SOLN
6.0000 mg | Freq: Once | INTRAVENOUS | Status: AC
  Administered 2019-02-05: 6 mg via INTRAVENOUS
  Filled 2019-02-05: qty 2

## 2019-02-05 MED ORDER — SODIUM CHLORIDE 0.9 % IV SOLN
INTRAVENOUS | Status: DC
  Administered 2019-02-05: 12:00:00 via INTRAVENOUS

## 2019-02-05 MED ORDER — LORAZEPAM 2 MG/ML IJ SOLN
1.0000 mg | Freq: Once | INTRAMUSCULAR | Status: AC
  Administered 2019-02-05: 1 mg via INTRAVENOUS
  Filled 2019-02-05: qty 1

## 2019-02-05 MED ORDER — VALPROATE SODIUM 500 MG/5ML IV SOLN
500.0000 mg | Freq: Once | INTRAVENOUS | Status: AC
  Administered 2019-02-05: 500 mg via INTRAVENOUS
  Filled 2019-02-05: qty 5

## 2019-02-05 MED ORDER — SODIUM CHLORIDE 0.9 % IV BOLUS
1000.0000 mL | Freq: Once | INTRAVENOUS | Status: AC
  Administered 2019-02-05: 1000 mL via INTRAVENOUS

## 2019-02-05 MED ORDER — DIPHENHYDRAMINE HCL 50 MG/ML IJ SOLN
12.5000 mg | Freq: Once | INTRAMUSCULAR | Status: AC
  Administered 2019-02-05: 12.5 mg via INTRAVENOUS
  Filled 2019-02-05: qty 1

## 2019-02-05 MED ORDER — DIPHENHYDRAMINE HCL 50 MG/ML IJ SOLN: 25.0000 mg | Freq: Once | INTRAMUSCULAR | Status: DC

## 2019-02-05 MED ORDER — DEXAMETHASONE 4 MG PO TABS
10.0000 mg | ORAL_TABLET | Freq: Once | ORAL | Status: AC
  Administered 2019-02-05: 10 mg via ORAL
  Filled 2019-02-05: qty 2

## 2019-02-05 MED ORDER — HYDROMORPHONE HCL 1 MG/ML IJ SOLN
0.5000 mg | Freq: Once | INTRAMUSCULAR | Status: AC
  Administered 2019-02-05: 13:00:00 0.5 mg via INTRAVENOUS
  Filled 2019-02-05: qty 1

## 2019-02-05 NOTE — ED Provider Notes (Signed)
53 year old female with a chief complaint of a headache.  I received the patient in signout from Dr. Freida Busman, briefly the patient has a history of migraines his currently in pain management and is on many medications.  She has many allergies to typical migraine treatment.  She suggested that the only thing that works for her is Dilaudid she is gotten a milligram and a half of this and 6 morphine with minimal improvement.  Plan is to reassess post CT scan and lab work.  CT of the head is negative.  Lab work is unremarkable.  Patient is feeling mildly better she is requesting a another dose of Dilaudid before she is discharged.  Told me that typically she gets between 3 and 4 mg of Dilaudid before her headache improves.  I will have her follow-up with her pain management and neurologist.   Melene Plan, DO 02/05/19 1527

## 2019-02-05 NOTE — ED Provider Notes (Signed)
[facility]-EMERGENCY DEPT Provider Note   CSN: [id number] Arrival date & time: 02/05/19  [id number]    History   Chief Complaint Chief Complaint  Patient presents with  . Migraine    HPI Stephanie [name] is a 53 y.o. female.     53 year old female with long history of migraines who presents with her typical migraine exacerbation.  Symptoms have been for the past 2 days and have been associated with photophobia but denies any phonophobia.  No neck pain or stiffness.  No fever or chills.  Pain is on the right side of her head and has been unrelieved with taken Excedrin Migraine.  Denies any new weakness in upper or lower extremities.       Past Medical History:  Diagnosis Date  . Diabetes mellitus without complication (HCC)   . Difficult intubation   . Diverticulitis   . Headache   . Hepatitis C    treated and resolved  . History of kidney stones   . Hypertension   . IBS (irritable bowel syndrome)   . Kidney stones   . Sciatica   . Sleep apnea    on cpap  . Stroke [facility](HCC) 2017   memory loss    Patient Active Problem List   Diagnosis Date Noted  . Hypotension 08/22/2018  . Bradycardia 08/22/2018  . Type 2 diabetes mellitus (HCC) 08/22/2018  . Acute encephalopathy 08/21/2018  . Shoulder pain, right 12/22/2017  . UTI (urinary tract infection) 08/21/2017  . Nausea & vomiting 08/21/2017  . Nicotine dependence 08/21/2017  . Headache 01/07/2017  . Small bowel obstruction (HCC) 09/02/2016  . Pain and swelling of left upper extremity 07/22/2016  . Superficial venous thrombosis of arm, left 07/22/2016  . ICH (intracerebral hemorrhage) (HCC)   . Essential hypertension, malignant 04/19/2016  . Cytotoxic brain edema (HCC) 04/19/2016  . IVH (intraventricular hemorrhage) (HCC) 04/18/2016  . Hepatitis C     Past Surgical History:  Procedure Laterality Date  . ABDOMINAL HYSTERECTOMY    . carpel tunnel syndrome  2017  . HARDWARE REMOVAL Right 12/22/2017    Procedure: HARDWARE REMOVAL-RIGHT ARM;  Surgeon: Lyndle HerrlichBowers, James R, MD;  Location: [facility] ORS;  Service: Orthopedics;  Laterality: Right;  Removal of implant, superficial right humerus  . HUMERUS IM NAIL Right 03/11/2017   Procedure: INTRAMEDULLARY (IM) NAIL HUMERAL;  Surgeon: Lyndle HerrlichBowers, James R, MD;  Location: [facility] ORS;  Service: Orthopedics;  Laterality: Right;  . KIDNEY STONE SURGERY Right 02/12/12  . KIDNEY STONE SURGERY Left 07/15/2012  . LIGATION OF ARTERIOVENOUS  FISTULA Left 06/21/2016   Procedure: LIGATION OF ARTERIOVENOUS  FISTULA ( LIGATION BASILIC VEIN );  Surgeon: Renford DillsGregory G Schnier, MD;  Location: [facility] ORS;  Service: Vascular;  Laterality: Left;  . LITHOTRIPSY    . OOPHORECTOMY    . SINUS EXPLORATION    . TONSILLECTOMY       OB History   No obstetric history on file.      Home Medications    Prior to Admission medications   Medication Sig Start Date End Date Taking? Authorizing Provider  alprazolam Prudy Feeler(XANAX) 2 MG tablet Take 2 mg by mouth 2 (two) times daily as needed for anxiety. 10/13/18   [provider]  atorvastatin (LIPITOR) 40 MG tablet Take 40 mg by mouth daily. 12/10/18   [provider]  Butalbital-APAP-Caffeine 50-300-40 MG CAPS Take 1 capsule by mouth every 8 (eight) hours as needed (migraine).  11/18/18   [provider]  cloNIDine (CATAPRES)  0.2 MG tablet Take 0.2 mg by mouth 2 (two) times daily. 10/02/18   [provider]  clopidogrel (PLAVIX) 75 MG tablet Take 1 tablet (75 mg total) by mouth daily. 06/27/16   Marvel Plan, MD  etodolac (LODINE) 300 MG capsule Take 1 capsule (300 mg total) by mouth 3 (three) times daily as needed (pain). 01/16/19   Linwood Dibbles, MD  FLUoxetine (PROZAC) 40 MG capsule Take 40 mg by mouth daily.  06/28/18   [provider]  Galcanezumab-gnlm (EMGALITY) 120 MG/ML SOSY Inject 120 mg into the skin every 30 (thirty) days.    [provider]  glipiZIDE (GLUCOTROL) 5 MG tablet Take 1 tablet (5 mg  total) by mouth 2 (two) times daily before a meal. 04/30/16   Lora Paula, MD  hydrALAZINE (APRESOLINE) 50 MG tablet Take 50 mg by mouth 3 (three) times daily. 12/08/18   [provider]  loratadine (CLARITIN) 10 MG tablet Take 10 mg by mouth daily.    [provider]  losartan (COZAAR) 100 MG tablet Take 100 mg by mouth daily. 10/02/18   [provider]  omeprazole (PRILOSEC) 40 MG capsule Take 40 mg by mouth daily.    [provider]  zolpidem (AMBIEN) 10 MG tablet Take 10 mg by mouth at bedtime.  10/13/18   [provider]    Family History Family History  Problem Relation Age of Onset  . Heart failure Mother   . Diabetes Father   . Cancer Sister     Social History Social History   Tobacco Use  . Smoking status: Current Every Day Smoker    Packs/day: 1.00    Years: 1.50    Pack years: 1.50    Types: Cigarettes  . Smokeless tobacco: Never Used  Substance Use Topics  . Alcohol use: Not Currently    Comment: no alcohol since 2002  . Drug use: No    Comment: electric cig     Allergies   Gabapentin; Tylenol [acetaminophen]; Aspirin; Buprenorphine hcl; Codeine; Compazine; Ioxaglate; Ivp dye [iodinated diagnostic agents]; Metrizamide; Naproxen; Norco [hydrocodone-acetaminophen]; Penicillin g; Prochlorperazine maleate; Reglan [metoclopramide]; Sulfur; Tegretol [carbamazepine]; Toradol [ketorolac tromethamine]; Tramadol; and Zofran [ondansetron hcl]   Review of Systems Review of Systems  All other systems reviewed and are negative.    Physical Exam Updated Vital Signs BP 134/82 (BP Location: Left Arm)   Pulse 76   Temp 98.9 F (37.2 C) (Oral)   Resp 16   SpO2 97%   Physical Exam Vitals signs and nursing note reviewed.  Constitutional:      General: She is not in acute distress.    Appearance: Normal appearance. She is well-developed. She is not toxic-appearing.  HENT:     Head: Normocephalic and atraumatic.  Eyes:      General: Lids are normal.     Conjunctiva/sclera: Conjunctivae normal.     Pupils: Pupils are equal, round, and reactive to light.  Neck:     Musculoskeletal: Normal range of motion and neck supple.     Thyroid: No thyroid mass.     Trachea: No tracheal deviation.  Cardiovascular:     Rate and Rhythm: Normal rate and regular rhythm.     Heart sounds: Normal heart sounds. No murmur. No gallop.   Pulmonary:     Effort: Pulmonary effort is normal. No respiratory distress.     Breath sounds: Normal breath sounds. No stridor. No decreased breath sounds, wheezing, rhonchi or rales.  Abdominal:  General: Bowel sounds are normal. There is no distension.     Palpations: Abdomen is soft.     Tenderness: There is no abdominal tenderness. There is no rebound.  Musculoskeletal: Normal range of motion.        General: No tenderness.  Skin:    General: Skin is warm and dry.     Findings: No abrasion or rash.  Neurological:     General: No focal deficit present.     Mental Status: She is alert and oriented to person, place, and time.     GCS: GCS eye subscore is 4. GCS verbal subscore is 5. GCS motor subscore is 6.     Cranial Nerves: No cranial nerve deficit or dysarthria.     Sensory: No sensory deficit.     Motor: No weakness or tremor.  Psychiatric:        Speech: Speech normal.        Behavior: Behavior normal.      ED Treatments / Results  Labs (all labs ordered are listed, but only abnormal results are displayed) Labs Reviewed - No data to display  EKG None  Radiology No results found.  Procedures Procedures (including critical care time)  Medications Ordered in ED Medications  sodium chloride 0.9 % bolus 1,000 mL (has no administration in time range)  0.9 %  sodium chloride infusion (has no administration in time range)  LORazepam (ATIVAN) injection 1 mg (has no administration in time range)  morphine 4 MG/ML injection 6 mg (has no administration in time range)   diphenhydrAMINE (BENADRYL) injection 12.5 mg (has no administration in time range)     Initial Impression / Assessment and Plan / ED Course  I have reviewed the triage vital signs and the nursing notes.  Pertinent labs & imaging results that were available during my care of the patient were reviewed by me and considered in my medical decision making (see chart for details).        Patient medicated for her usual migraine here.  States that only thing it helps her is morphine or Dilaudid.  She has multiple allergies.  Was medicated with 6 of morphine and 1 of Ativan.  Also given IV fluids and notes minimal relief of symptoms.  She was then given Depakote 500 mg IV piggyback as well as a re-dosing of Dilaudid.  Will order head CT at this time.  She has no focal neurological deficits and has no fever.  Final Clinical Impressions(s) / ED Diagnoses   Final diagnoses:  None    ED Discharge Orders    None       Lorre Nick, MD 02/07/19 [phone]

## 2019-02-05 NOTE — ED Notes (Signed)
Bed: IR48 Expected date: 02/04/19 Expected time: 11:21 PM Means of arrival:  Comments:

## 2019-02-05 NOTE — Discharge Instructions (Signed)
Please follow-up with your neurologist and pain management physician.  Let them know that you are having ongoing headaches.  Please return for fever one-sided weakness difficulty with speech or swallowing.

## 2019-02-08 ENCOUNTER — Encounter: Payer: Self-pay | Admitting: Emergency Medicine

## 2019-02-08 ENCOUNTER — Emergency Department
Admission: EM | Admit: 2019-02-08 | Discharge: 2019-02-09 | Disposition: A | Discharge status: Home / Self Care | Payer: Medicaid Other | Attending: Emergency Medicine | Admitting: Emergency Medicine

## 2019-02-08 ENCOUNTER — Ambulatory Visit
Payer: No Typology Code available for payment source | Admitting: Student in an Organized Health Care Education/Training Program

## 2019-02-08 ENCOUNTER — Other Ambulatory Visit: Payer: Self-pay

## 2019-02-08 DIAGNOSIS — R109 Unspecified abdominal pain: Secondary | ICD-10-CM | POA: Insufficient documentation

## 2019-02-08 DIAGNOSIS — Z87442 Personal history of urinary calculi: Secondary | ICD-10-CM | POA: Diagnosis not present

## 2019-02-08 DIAGNOSIS — I10 Essential (primary) hypertension: Secondary | ICD-10-CM | POA: Diagnosis not present

## 2019-02-08 DIAGNOSIS — Z7902 Long term (current) use of antithrombotics/antiplatelets: Secondary | ICD-10-CM | POA: Diagnosis not present

## 2019-02-08 DIAGNOSIS — Z8673 Personal history of transient ischemic attack (TIA), and cerebral infarction without residual deficits: Secondary | ICD-10-CM | POA: Diagnosis not present

## 2019-02-08 DIAGNOSIS — Z79899 Other long term (current) drug therapy: Secondary | ICD-10-CM | POA: Insufficient documentation

## 2019-02-08 DIAGNOSIS — G8929 Other chronic pain: Secondary | ICD-10-CM | POA: Diagnosis not present

## 2019-02-08 DIAGNOSIS — E119 Type 2 diabetes mellitus without complications: Secondary | ICD-10-CM | POA: Diagnosis not present

## 2019-02-08 DIAGNOSIS — Z7984 Long term (current) use of oral hypoglycemic drugs: Secondary | ICD-10-CM | POA: Insufficient documentation

## 2019-02-08 DIAGNOSIS — F17210 Nicotine dependence, cigarettes, uncomplicated: Secondary | ICD-10-CM | POA: Insufficient documentation

## 2019-02-08 LAB — CBC WITH DIFFERENTIAL/PLATELET
Abs Immature Granulocytes: 0.03 10*3/uL (ref 0.00–0.07)
Basophils Absolute: 0 10*3/uL (ref 0.0–0.1)
Basophils Relative: 0 %
Eosinophils Absolute: 0.3 10*3/uL (ref 0.0–0.5)
Eosinophils Relative: 3 %
HCT: 37.8 % (ref 36.0–46.0)
Hemoglobin: 12.3 g/dL (ref 12.0–15.0)
Immature Granulocytes: 0 %
Lymphocytes Relative: 29 %
Lymphs Abs: 2.7 10*3/uL (ref 0.7–4.0)
MCH: 30.6 pg (ref 26.0–34.0)
MCHC: 32.5 g/dL (ref 30.0–36.0)
MCV: 94 fL (ref 80.0–100.0)
Monocytes Absolute: 0.9 10*3/uL (ref 0.1–1.0)
Monocytes Relative: 10 %
Neutro Abs: 5.2 10*3/uL (ref 1.7–7.7)
Neutrophils Relative %: 58 %
Platelets: 181 10*3/uL (ref 150–400)
RBC: 4.02 MIL/uL (ref 3.87–5.11)
RDW: 14.6 % (ref 11.5–15.5)
WBC: 9.1 10*3/uL (ref 4.0–10.5)
nRBC: 0 % (ref 0.0–0.2)

## 2019-02-08 LAB — URINALYSIS, COMPLETE (UACMP) WITH MICROSCOPIC
Bilirubin Urine: NEGATIVE
Glucose, UA: >=500 mg/dL — AB
Hgb urine dipstick: NEGATIVE
Ketones, ur: NEGATIVE mg/dL
Nitrite: NEGATIVE
Protein, ur: NEGATIVE mg/dL
Specific Gravity, Urine: 1.002 — ABNORMAL LOW (ref 1.005–1.030)
pH: 8 (ref 5.0–8.0)

## 2019-02-08 LAB — COMPREHENSIVE METABOLIC PANEL
ALT: 30 U/L (ref 0–44)
AST: 18 U/L (ref 15–41)
Albumin: 3.7 g/dL (ref 3.5–5.0)
Alkaline Phosphatase: 104 U/L (ref 38–126)
Anion gap: 7 (ref 5–15)
BUN: 6 mg/dL (ref 6–20)
CO2: 25 mmol/L (ref 22–32)
Calcium: 8.9 mg/dL (ref 8.9–10.3)
Chloride: 105 mmol/L (ref 98–111)
Creatinine, Ser: 0.51 mg/dL (ref 0.44–1.00)
GFR calc Af Amer: >60 mL/min (ref >60)
GFR calc non Af Amer: >60 mL/min (ref >60)
Glucose, Bld: 203 mg/dL — ABNORMAL HIGH (ref 70–99)
Potassium: 4.4 mmol/L (ref 3.5–5.1)
Sodium: 137 mmol/L (ref 135–145)
Total Bilirubin: 0.5 mg/dL (ref 0.3–1.2)
Total Protein: 6.9 g/dL (ref 6.5–8.1)

## 2019-02-08 NOTE — ED Triage Notes (Addendum)
Patient ambulatory to triage with steady gait, without difficulty or distress noted, mask in place; pt reports rt flank and rt lower abd pain since Monday; st dx with kidney stones; see [facility] ED notes 5/31 regarding "pain seeking behavior, multiple prior ED visits"

## 2019-02-09 NOTE — ED Notes (Signed)
Patient discharged to home per MD order. Patient in stable condition, and deemed medically cleared by ED provider for discharge. Discharge instructions reviewed with patient/family using "Teach Back"; verbalized understanding of medication education and administration, and information about follow-up care. Denies further concerns. ° °

## 2019-02-09 NOTE — Discharge Instructions (Addendum)
As we discussed, your lab work-up was reassuring and your urinary tract infection seems to be improving based on your other recent urinalyses at the other hospital as you have visited.  Your pain appears to be chronic with no evidence of an acute issue and we cannot treat chronic pain in the emergency department.  Please follow-up with your doctors at [facility] and your pain management doctor as scheduled.  Return to the emergency department if you develop new or worsening symptoms that concern you.

## 2019-02-09 NOTE — ED Notes (Signed)
Dr York Cerise and Gwynneth Munson, RN a bedside at this time.

## 2019-02-09 NOTE — ED Provider Notes (Signed)
[facility] Emergency Department Provider Note  ____________________________________________   First MD Initiated Contact with Patient 02/09/19 0001     (approximate)  I have reviewed the triage vital signs and the nursing notes.   HISTORY  Chief Complaint Flank Pain    HPI Stephanie [name] is a 53 y.o. female with medical history as listed below which notably includes a long history of bilateral renal calculi and numerous emergency department visits within the [facility] system as well as at both [facility] and [place].  She presents tonight by private vehicle for evaluation of persistent right flank and lower abdominal pain.  She says it has been going on for about a week.  She did not initially talk about her other recent ED visits but after I mentioned it admitted that she has been to multiple other emergency departments recently.  She is currently taking antibiotics for a probable urinary tract infection as well as Diflucan for yeast infection.  She reports the pain is severe, nothing in particular makes it better or worse.  She said that she has had some nausea and vomiting but is not certain when.  She denies fever/chills, sore throat, chest pain, shortness of breath.  It feels similar to her prior pain.  She says she has an appointment by computer with a pain management doctor to establish care tomorrow.  She says she has an appointment coming up with her urologist at [facility].  She came in tonight because she just could not manage the pain any longer.         Past Medical History:  Diagnosis Date  . Diabetes mellitus without complication (HCC)   . Difficult intubation   . Diverticulitis   . Headache   . Hepatitis C    treated and resolved  . History of kidney stones   . Hypertension   . IBS (irritable bowel syndrome)   . Kidney stones   . Sciatica   . Sleep apnea    on cpap  . Stroke [facility]) 2017   memory loss    Patient Active Problem List   Diagnosis Date  Noted  . Hypotension 08/22/2018  . Bradycardia 08/22/2018  . Type 2 diabetes mellitus (HCC) 08/22/2018  . Acute encephalopathy 08/21/2018  . Shoulder pain, right 12/22/2017  . UTI (urinary tract infection) 08/21/2017  . Nausea & vomiting 08/21/2017  . Nicotine dependence 08/21/2017  . Headache 01/07/2017  . Small bowel obstruction (HCC) 09/02/2016  . Pain and swelling of left upper extremity 07/22/2016  . Superficial venous thrombosis of arm, left 07/22/2016  . ICH (intracerebral hemorrhage) (HCC)   . Essential hypertension, malignant 04/19/2016  . Cytotoxic brain edema (HCC) 04/19/2016  . IVH (intraventricular hemorrhage) (HCC) 04/18/2016  . Hepatitis C     Past Surgical History:  Procedure Laterality Date  . ABDOMINAL HYSTERECTOMY    . carpel tunnel syndrome  2017  . HARDWARE REMOVAL Right 12/22/2017   Procedure: HARDWARE REMOVAL-RIGHT ARM;  Surgeon: Lyndle Herrlich, MD;  Location: [facility] ORS;  Service: Orthopedics;  Laterality: Right;  Removal of implant, superficial right humerus  . HUMERUS IM NAIL Right 03/11/2017   Procedure: INTRAMEDULLARY (IM) NAIL HUMERAL;  Surgeon: Lyndle Herrlich, MD;  Location: [facility] ORS;  Service: Orthopedics;  Laterality: Right;  . KIDNEY STONE SURGERY Right 02/12/12  . KIDNEY STONE SURGERY Left 07/15/2012  . LIGATION OF ARTERIOVENOUS  FISTULA Left 06/21/2016   Procedure: LIGATION OF ARTERIOVENOUS  FISTULA ( LIGATION BASILIC VEIN );  Surgeon:  Renford Dills, MD;  Location: [facility] ORS;  Service: Vascular;  Laterality: Left;  . LITHOTRIPSY    . OOPHORECTOMY    . SINUS EXPLORATION    . TONSILLECTOMY      Prior to Admission medications   Medication Sig Start Date End Date Taking? Authorizing Provider  alprazolam Prudy Feeler) 2 MG tablet Take 2 mg by mouth 2 (two) times daily as needed for anxiety. 10/13/18   [provider]  atorvastatin (LIPITOR) 40 MG tablet Take 40 mg by mouth daily. 12/10/18   [provider]  cloNIDine (CATAPRES) 0.2 MG  tablet Take 0.2 mg by mouth 2 (two) times daily. 10/02/18   [provider]  clopidogrel (PLAVIX) 75 MG tablet Take 1 tablet (75 mg total) by mouth daily. 06/27/16   Marvel Plan, MD  etodolac (LODINE) 300 MG capsule Take 1 capsule (300 mg total) by mouth 3 (three) times daily as needed (pain). Patient not taking: Reported on 02/05/2019 01/16/19   Linwood Dibbles, MD  FLUoxetine (PROZAC) 40 MG capsule Take 40 mg by mouth daily.  06/28/18   [provider]  Galcanezumab-gnlm (EMGALITY) 120 MG/ML SOSY Inject 120 mg into the skin every 30 (thirty) days.    [provider]  glipiZIDE (GLUCOTROL) 5 MG tablet Take 1 tablet (5 mg total) by mouth 2 (two) times daily before a meal. 04/30/16   Lora Paula, MD  hydrALAZINE (APRESOLINE) 50 MG tablet Take 50 mg by mouth 3 (three) times daily. 12/08/18   [provider]  loratadine (CLARITIN) 10 MG tablet Take 10 mg by mouth daily.    [provider]  losartan (COZAAR) 100 MG tablet Take 100 mg by mouth daily. 10/02/18   [provider]  omeprazole (PRILOSEC) 40 MG capsule Take 40 mg by mouth daily.    [provider]  zolpidem (AMBIEN) 10 MG tablet Take 10 mg by mouth at bedtime.  10/13/18   [provider]    Allergies Gabapentin; Tylenol [acetaminophen]; Aspirin; Buprenorphine hcl; Codeine; Compazine; Ioxaglate; Ivp dye [iodinated diagnostic agents]; Metrizamide; Naproxen; Norco [hydrocodone-acetaminophen]; Penicillin g; Prochlorperazine maleate; Reglan [metoclopramide]; Sulfur; Tegretol [carbamazepine]; Toradol [ketorolac tromethamine]; Tramadol; and Zofran [ondansetron hcl]  Family History  Problem Relation Age of Onset  . Heart failure Mother   . Diabetes Father   . Cancer Sister     Social History Social History   Tobacco Use  . Smoking status: Current Every Day Smoker    Packs/day: 1.00    Years: 1.50    Pack years: 1.50    Types: Cigarettes  . Smokeless tobacco: Never Used   Substance Use Topics  . Alcohol use: Not Currently    Comment: no alcohol since 2002  . Drug use: No    Comment: electric cig    Review of Systems Constitutional: No fever/chills Eyes: No visual changes. ENT: No sore throat. Cardiovascular: Denies chest pain. Respiratory: Denies shortness of breath. Gastrointestinal: Right flank and right-sided abdominal pain. Genitourinary: Negative for dysuria. Musculoskeletal: Negative for neck pain.  Negative for back pain. Integumentary: Negative for rash. Neurological: Negative for headaches, focal weakness or numbness.   ____________________________________________   PHYSICAL EXAM:  VITAL SIGNS: ED Triage Vitals  Enc Vitals Group     BP 02/08/19 2109 (!) 182/97     Pulse Rate 02/08/19 2109 80     Resp 02/08/19 2109 20     Temp 02/08/19 2109 98 F (36.7 C)     Temp Source 02/08/19 2109 Oral  SpO2 02/08/19 2109 97 %     Weight 02/08/19 2109 91.6 kg (201 lb 15.1 oz)     Height 02/08/19 2109 1.575 m (5\' 2" )     Head Circumference --      Peak Flow --      Pain Score 02/08/19 2107 10     Pain Loc --      Pain Edu? --      Excl. in GC? --     Constitutional: Alert and oriented. Well appearing and in no acute distress. Eyes: Conjunctivae are normal.  Head: Atraumatic. Nose: No congestion/rhinnorhea. Mouth/Throat: Mucous membranes are moist. Neck: No stridor.  No meningeal signs.   Cardiovascular: Normal rate, regular rhythm. Good peripheral circulation. Grossly normal heart sounds. Respiratory: Normal respiratory effort.  No retractions. No audible wheezing. Gastrointestinal: Soft and nondistended.  Mild diffuse tenderness to palpation throughout the abdomen without any focal tenderness. Musculoskeletal: No lower extremity tenderness nor edema. No gross deformities of extremities. Neurologic:  Normal speech and language. No gross focal neurologic deficits are appreciated.  Skin:  Skin is warm, dry and intact. No rash noted.  Psychiatric: Mood and affect are normal. Speech and behavior are normal.  ____________________________________________   LABS (all labs ordered are listed, but only abnormal results are displayed)  Labs Reviewed  COMPREHENSIVE METABOLIC PANEL - Abnormal; Notable for the following components:      Result Value   Glucose, Bld 203 (*)    All other components within normal limits  URINALYSIS, COMPLETE (UACMP) WITH MICROSCOPIC - Abnormal; Notable for the following components:   Color, Urine STRAW (*)    APPearance HAZY (*)    Specific Gravity, Urine 1.002 (*)    Glucose, UA >=500 (*)    Leukocytes,Ua SMALL (*)    Bacteria, UA RARE (*)    All other components within normal limits  CBC WITH DIFFERENTIAL/PLATELET   ____________________________________________  EKG  None - EKG not ordered by ED physician ____________________________________________  RADIOLOGY   ED MD interpretation: No indication for emergent imaging  Official radiology report(s): No results found.  ____________________________________________   PROCEDURES   Procedure(s) performed (including Critical Care):  Procedures   ____________________________________________   INITIAL IMPRESSION / MDM / ASSESSMENT AND PLAN / ED COURSE  As part of my medical decision making, I reviewed the following data within the electronic MEDICAL RECORD NUMBER Nursing notes reviewed and incorporated, Labs reviewed , Old chart reviewed, Notes from prior ED visits and [place] Controlled Substance Database      *Loa B Vear Clock[place]ips was evaluated in Emergency Department on 02/09/2019 for the symptoms described in the history of present illness. She was evaluated in the context of the global COVID-19 pandemic, which necessitated consideration that the patient might be at risk for infection with the SARS-CoV-2 virus that causes COVID-19. Institutional protocols and algorithms that pertain to the evaluation of patients at risk for COVID-19 are  in a state of rapid change based on information released by regulatory bodies including the CDC and federal and state organizations. These policies and algorithms were followed during the patient's care in the ED.  Some ED evaluations and interventions may be delayed as a result of limited staffing during the pandemic.*  Differential diagnosis includes, but is not limited to, drug-seeking behavior, chronic pain, acute UTI/pyelonephritis, ureteral or renal obstruction, other intra-abdominal infection.  [facility] and admit her everywhere.  She has had at least 8 emergency department visits within the [facility] and  [facility] systems in the last 6 months and this is her 11th visit within the [facility] system.  She went to [facility] ED just over 24 hours ago and there was a note mentioned of drug-seeking behavior.  I read several ED notes from recent visits to other facilities where she got multiple doses of Dilaudid, had a reassuring work-up including CT scans and ultrasounds, and when told that there was no evidence of an acute issue she asked for more Dilaudid before she was discharged.  Her vital signs are stable and reassuring.  She has no tachycardia and no fever.  After I had my discussion with her she immediately put on her clothes and was ambulating around without any difficulty in no apparent pain.  I explained to her that there is no evidence she has an acute issue at this time.  I explained that chronic intrarenal calculi typically do not cause pain and based on her visit pattern to multiple emergency departments all over the state I think that her main issue is in fact chronic pain.  I explained that the emergency department is unable to treat chronic pain and that she needs to follow-up with her regular doctors.  That was when she disclosed that she has an appointment with a pain management doctor tomorrow as well as follow-up at [facility].  I encouraged her to continue taking her  antibiotics and other medications and went over return precautions and reasons to come back to the emergency department but explained why I do not think that additional imaging would be indicated given the literal dozens of CT scans she has received over the last year and longer from within [place], [place], and [facility].  She says that she understands and will follow up and immediately picked up her phone to call her husband.      ____________________________________________  FINAL CLINICAL IMPRESSION(S) / ED DIAGNOSES  Final diagnoses:  Chronic flank pain     MEDICATIONS GIVEN DURING THIS VISIT:  Medications - No data to display   ED Discharge Orders    None       Note:  This document was prepared using Dragon voice recognition software and may include unintentional dictation errors.   Loleta Rose, MD 02/09/19 [phone]

## 2019-02-10 ENCOUNTER — Other Ambulatory Visit: Payer: Self-pay

## 2019-02-10 ENCOUNTER — Encounter (HOSPITAL_COMMUNITY): Payer: Self-pay | Admitting: Emergency Medicine

## 2019-02-10 ENCOUNTER — Emergency Department (HOSPITAL_COMMUNITY): Payer: Medicaid Other

## 2019-02-10 ENCOUNTER — Emergency Department (HOSPITAL_COMMUNITY)
Admission: EM | Admit: 2019-02-10 | Discharge: 2019-02-10 | Disposition: A | Discharge status: Home / Self Care | Payer: Medicaid Other | Attending: Emergency Medicine | Admitting: Emergency Medicine

## 2019-02-10 DIAGNOSIS — F17210 Nicotine dependence, cigarettes, uncomplicated: Secondary | ICD-10-CM | POA: Diagnosis not present

## 2019-02-10 DIAGNOSIS — Z79899 Other long term (current) drug therapy: Secondary | ICD-10-CM | POA: Diagnosis not present

## 2019-02-10 DIAGNOSIS — R1031 Right lower quadrant pain: Secondary | ICD-10-CM | POA: Insufficient documentation

## 2019-02-10 DIAGNOSIS — I10 Essential (primary) hypertension: Secondary | ICD-10-CM | POA: Diagnosis not present

## 2019-02-10 DIAGNOSIS — G8929 Other chronic pain: Secondary | ICD-10-CM

## 2019-02-10 DIAGNOSIS — Z7984 Long term (current) use of oral hypoglycemic drugs: Secondary | ICD-10-CM | POA: Diagnosis not present

## 2019-02-10 DIAGNOSIS — E119 Type 2 diabetes mellitus without complications: Secondary | ICD-10-CM | POA: Diagnosis not present

## 2019-02-10 LAB — URINALYSIS, ROUTINE W REFLEX MICROSCOPIC
Bilirubin Urine: NEGATIVE
Glucose, UA: NEGATIVE mg/dL
Hgb urine dipstick: NEGATIVE
Ketones, ur: NEGATIVE mg/dL
Nitrite: NEGATIVE
Protein, ur: NEGATIVE mg/dL
Specific Gravity, Urine: 1.008 (ref 1.005–1.030)
pH: 8 (ref 5.0–8.0)

## 2019-02-10 LAB — BASIC METABOLIC PANEL
Anion gap: 9 (ref 5–15)
BUN: 7 mg/dL (ref 6–20)
CO2: 24 mmol/L (ref 22–32)
Calcium: 9 mg/dL (ref 8.9–10.3)
Chloride: 106 mmol/L (ref 98–111)
Creatinine, Ser: 0.57 mg/dL (ref 0.44–1.00)
GFR calc Af Amer: >60 mL/min (ref >60)
GFR calc non Af Amer: >60 mL/min (ref >60)
Glucose, Bld: 128 mg/dL — ABNORMAL HIGH (ref 70–99)
Potassium: 4.1 mmol/L (ref 3.5–5.1)
Sodium: 139 mmol/L (ref 135–145)

## 2019-02-10 LAB — CBC
HCT: 39.4 % (ref 36.0–46.0)
Hemoglobin: 12.9 g/dL (ref 12.0–15.0)
MCH: 30.9 pg (ref 26.0–34.0)
MCHC: 32.7 g/dL (ref 30.0–36.0)
MCV: 94.5 fL (ref 80.0–100.0)
Platelets: 216 10*3/uL (ref 150–400)
RBC: 4.17 MIL/uL (ref 3.87–5.11)
RDW: 14.4 % (ref 11.5–15.5)
WBC: 8.1 10*3/uL (ref 4.0–10.5)
nRBC: 0 % (ref 0.0–0.2)

## 2019-02-10 MED ORDER — ONDANSETRON 4 MG PO TBDP
4.0000 mg | ORAL_TABLET | Freq: Once | ORAL | Status: AC
  Administered 2019-02-10: 4 mg via ORAL
  Filled 2019-02-10: qty 1

## 2019-02-10 NOTE — ED Notes (Signed)
Pt given discharge instruction but pt threw them away in the trash can.  Pt ambulated out of department without incident.

## 2019-02-10 NOTE — ED Triage Notes (Signed)
Recently seen for same; diagnosed with kidney stones on right; worsening pain unable to get an appointment with urology until 6/23. +

## 2019-02-10 NOTE — ED Notes (Signed)
Pt refused to get an abd x-ray until pt got nausea and pain medication.  Greta Doom, PA made aware.

## 2019-02-10 NOTE — ED Notes (Signed)
Pt offered tylenol for her migraine but pt declined.  Pt states that she wants her IV out at 12:30pm because he husband gets off work about that time to pick her up.  Greta Doom, PA made aware.

## 2019-02-10 NOTE — Discharge Instructions (Addendum)
Fortunately your urine result showing improvement of your infection. Continue with your current antibiotic.  Call and follow up with your doctor for further management of your recurrent pain.  A pain specialist may be more equip to help with pain control.

## 2019-02-10 NOTE — ED Provider Notes (Signed)
[facility]-EMERGENCY DEPT Provider Note   CSN: [id number] Arrival date & time: 02/10/19  0915    History   Chief Complaint Chief Complaint  Patient presents with  . Flank Pain    HPI Stephanie [name] is a 53 y.o. female.     The history is provided by the patient and medical records. No language interpreter was used.  Flank Pain      53 year old female with a long history of kidney stones who is been seen in the ED multiple times in the past presenting for evaluation of flank pain.  This is patient's third ER visit within the past 3 days at different facility for the same complaint.  Patient report for the past 2 weeks she has had recurrent pain to her right flank left flank and abdomen.  Pain is sharp, achy, severe, waxing and waning.  Pain felt similar to prior kidney stone pain.  She endorsed nausea without vomiting.  She denies fever or chills but does endorse some dysuria and states she is been treated for urinary tract infection with antibiotic.  States that it was diagnosed 3 days ago.  She mention the next appointment with her urologist is in 20 days.  She is here due to persistent worsening pain.  She admits that she was seen at a different facility for her pain but did not receive any kind of treatment.  Past Medical History:  Diagnosis Date  . Diabetes mellitus without complication (HCC)   . Difficult intubation   . Diverticulitis   . Headache   . Hepatitis C    treated and resolved  . History of kidney stones   . Hypertension   . IBS (irritable bowel syndrome)   . Kidney stones   . Sciatica   . Sleep apnea    on cpap  . Stroke [facility](HCC) 2017   memory loss    Patient Active Problem List   Diagnosis Date Noted  . Hypotension 08/22/2018  . Bradycardia 08/22/2018  . Type 2 diabetes mellitus (HCC) 08/22/2018  . Acute encephalopathy 08/21/2018  . Shoulder pain, right 12/22/2017  . UTI (urinary tract infection) 08/21/2017  . Nausea &  vomiting 08/21/2017  . Nicotine dependence 08/21/2017  . Headache 01/07/2017  . Small bowel obstruction (HCC) 09/02/2016  . Pain and swelling of left upper extremity 07/22/2016  . Superficial venous thrombosis of arm, left 07/22/2016  . ICH (intracerebral hemorrhage) (HCC)   . Essential hypertension, malignant 04/19/2016  . Cytotoxic brain edema (HCC) 04/19/2016  . IVH (intraventricular hemorrhage) (HCC) 04/18/2016  . Hepatitis C     Past Surgical History:  Procedure Laterality Date  . ABDOMINAL HYSTERECTOMY    . carpel tunnel syndrome  2017  . HARDWARE REMOVAL Right 12/22/2017   Procedure: HARDWARE REMOVAL-RIGHT ARM;  Surgeon: Lyndle HerrlichBowers, James R, MD;  Location: [facility] ORS;  Service: Orthopedics;  Laterality: Right;  Removal of implant, superficial right humerus  . HUMERUS IM NAIL Right 03/11/2017   Procedure: INTRAMEDULLARY (IM) NAIL HUMERAL;  Surgeon: Lyndle HerrlichBowers, James R, MD;  Location: [facility] ORS;  Service: Orthopedics;  Laterality: Right;  . KIDNEY STONE SURGERY Right 02/12/12  . KIDNEY STONE SURGERY Left 07/15/2012  . LIGATION OF ARTERIOVENOUS  FISTULA Left 06/21/2016   Procedure: LIGATION OF ARTERIOVENOUS  FISTULA ( LIGATION BASILIC VEIN );  Surgeon: Renford DillsGregory G Schnier, MD;  Location: [facility] ORS;  Service: Vascular;  Laterality: Left;  . LITHOTRIPSY    . OOPHORECTOMY    . SINUS EXPLORATION    .  TONSILLECTOMY       OB History   No obstetric history on file.      Home Medications    Prior to Admission medications   Medication Sig Start Date End Date Taking? Authorizing Provider  alprazolam Prudy Feeler) 2 MG tablet Take 2 mg by mouth 2 (two) times daily as needed for anxiety. 10/13/18   [provider]  atorvastatin (LIPITOR) 40 MG tablet Take 40 mg by mouth daily. 12/10/18   [provider]  cloNIDine (CATAPRES) 0.2 MG tablet Take 0.2 mg by mouth 2 (two) times daily. 10/02/18   [provider]  clopidogrel (PLAVIX) 75 MG tablet Take 1 tablet (75 mg total) by mouth daily.  06/27/16   Marvel Plan, MD  etodolac (LODINE) 300 MG capsule Take 1 capsule (300 mg total) by mouth 3 (three) times daily as needed (pain). Patient not taking: Reported on 02/05/2019 01/16/19   Linwood Dibbles, MD  FLUoxetine (PROZAC) 40 MG capsule Take 40 mg by mouth daily.  06/28/18   [provider]  Galcanezumab-gnlm (EMGALITY) 120 MG/ML SOSY Inject 120 mg into the skin every 30 (thirty) days.    [provider]  glipiZIDE (GLUCOTROL) 5 MG tablet Take 1 tablet (5 mg total) by mouth 2 (two) times daily before a meal. 04/30/16   Lora Paula, MD  hydrALAZINE (APRESOLINE) 50 MG tablet Take 50 mg by mouth 3 (three) times daily. 12/08/18   [provider]  loratadine (CLARITIN) 10 MG tablet Take 10 mg by mouth daily.    [provider]  losartan (COZAAR) 100 MG tablet Take 100 mg by mouth daily. 10/02/18   [provider]  omeprazole (PRILOSEC) 40 MG capsule Take 40 mg by mouth daily.    [provider]  zolpidem (AMBIEN) 10 MG tablet Take 10 mg by mouth at bedtime.  10/13/18   [provider]    Family History Family History  Problem Relation Age of Onset  . Heart failure Mother   . Diabetes Father   . Cancer Sister     Social History Social History   Tobacco Use  . Smoking status: Current Every Day Smoker    Packs/day: 1.00    Years: 1.50    Pack years: 1.50    Types: Cigarettes  . Smokeless tobacco: Never Used  Substance Use Topics  . Alcohol use: Not Currently    Comment: no alcohol since 2002  . Drug use: No    Comment: electric cig     Allergies   Gabapentin; Tylenol [acetaminophen]; Aspirin; Buprenorphine hcl; Codeine; Compazine; Ioxaglate; Ivp dye [iodinated diagnostic agents]; Metrizamide; Naproxen; Norco [hydrocodone-acetaminophen]; Penicillin g; Prochlorperazine maleate; Reglan [metoclopramide]; Sulfur; Tegretol [carbamazepine]; Toradol [ketorolac tromethamine]; Tramadol; and Zofran [ondansetron hcl]   Review  of Systems Review of Systems  Genitourinary: Positive for flank pain.  All other systems reviewed and are negative.    Physical Exam Updated Vital Signs BP (!) 200/105 (BP Location: Right Arm)   Pulse 91   Temp 98.6 F (37 C) (Oral)   Resp (!) 22   Ht  (1.575 m)   Wt 91.6 kg   SpO2 99%   BMI 36.94 kg/m   Physical Exam Vitals signs and nursing note reviewed.  Constitutional:      General: She is not in acute distress.    Appearance: She is well-developed.  HENT:     Head: Atraumatic.  Eyes:     Conjunctiva/sclera: Conjunctivae normal.  Neck:  Musculoskeletal: Neck supple.  Cardiovascular:     Rate and Rhythm: Normal rate and regular rhythm.     Pulses: Normal pulses.     Heart sounds: Normal heart sounds.  Pulmonary:     Effort: Pulmonary effort is normal.     Breath sounds: Normal breath sounds.  Abdominal:     General: Abdomen is flat.     Comments: Bilateral CVA tenderness.  Mild diffuse abdominal tenderness without focal point tenderness.  Skin:    Findings: No rash.  Neurological:     Mental Status: She is alert and oriented to person, place, and time.      ED Treatments / Results  Labs (all labs ordered are listed, but only abnormal results are displayed) Labs Reviewed  URINALYSIS, ROUTINE W REFLEX MICROSCOPIC - Abnormal; Notable for the following components:      Result Value   Leukocytes,Ua SMALL (*)    Bacteria, UA FEW (*)    All other components within normal limits  BASIC METABOLIC PANEL - Abnormal; Notable for the following components:   Glucose, Bld 128 (*)    All other components within normal limits  CBC    EKG None  Radiology Dg Abdomen 1 View  Result Date: 02/10/2019 CLINICAL DATA:  Right-sided flank pain for several weeks EXAM: ABDOMEN - 1 VIEW COMPARISON:  02/05/2018 FINDINGS: Five lumbar type vertebral bodies are well visualized. Vertebral body height is well maintained. Parenchymal calcification in the right kidney is  noted although no definitive renal calculi are noted on this exam. No bony abnormality is noted. IMPRESSION: No acute abnormality noted. Electronically Signed   By: Alcide Clever M.D.   On: 02/10/2019 11:04    Procedures Procedures (including critical care time)  Medications Ordered in ED Medications  ondansetron (ZOFRAN-ODT) disintegrating tablet 4 mg (4 mg Oral Given 02/10/19 1114)     Initial Impression / Assessment and Plan / ED Course  I have reviewed the triage vital signs and the nursing notes.  Pertinent labs & imaging results that were available during my care of the patient were reviewed by me and considered in my medical decision making (see chart for details).        BP (!) 149/94 (BP Location: Right Arm)   Pulse 83   Temp 98.3 F (36.8 C) (Oral)   Resp 14   Ht 5\' 2"  (1.575 m)   Wt 91.6 kg   SpO2 98%   BMI 36.94 kg/m    Final Clinical Impressions(s) / ED Diagnoses   Final diagnoses:  Chronic flank pain    ED Discharge Orders    None     Pt here with flank pain.  This is a chronic condition.  She has been seen and evaluated multiple times for the same complaint, most recent was yesterday.  It was felt that patient exhibiting drug seeking behavior.  Labs today without concerning finding.  UA shows evidence of UTI, improves from a few days ago.  Pt currently taking abx, recommend continue with abx as UTI likely contributing to her pain.  KUB without evidence to suggest obstructed stone.     Fayrene Helper, PA-C 02/11/19 1727    Derwood Kaplan, MD 02/12/19 1723

## 2019-02-10 NOTE — ED Notes (Signed)
Pt ambulated to the restroom with a steady gait.

## 2019-02-10 NOTE — ED Notes (Signed)
Pt states she has a migraine HA and would like medication.  Greta Doom, PA made aware.

## 2019-02-18 IMAGING — CT CT HEAD W/O CM
3 series · 14 of 47 positions shown, 16 images · non-contrast
Comparison: 10/18/2016

CLINICAL DATA: Fall with loss of consciousness. History of prior
cerebral hemorrhage.

EXAM:
CT HEAD WITHOUT CONTRAST
TECHNIQUE: Contiguous axial images were obtained from the base of the skull
through the vertex without intravenous contrast.

[Series 2: head wo · axial · 0.47mm/px · z∈[-146,-21]mm · 8 of 31 slices shown, 10 images]
[im 3/31  brain]
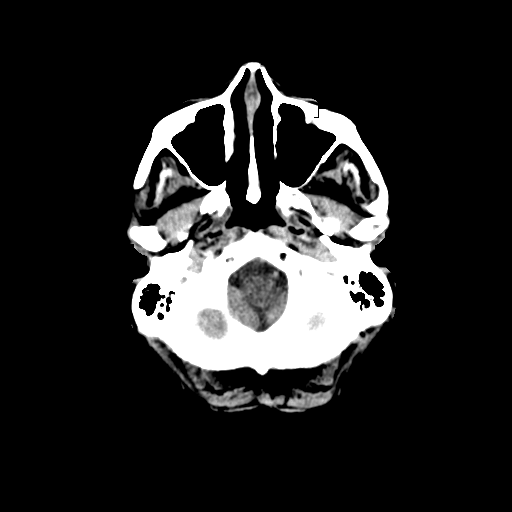
[im 3/31  bone]
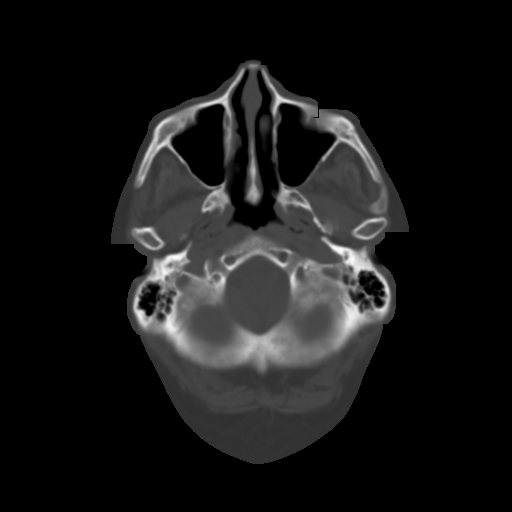
[im 7/31  brain]
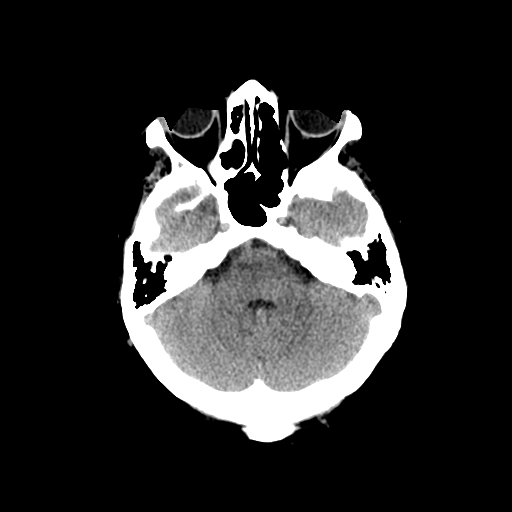
[im 10/31  brain]
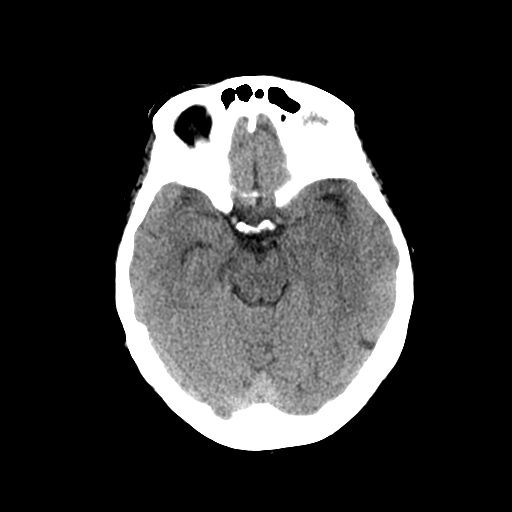
[im 14/31  brain]
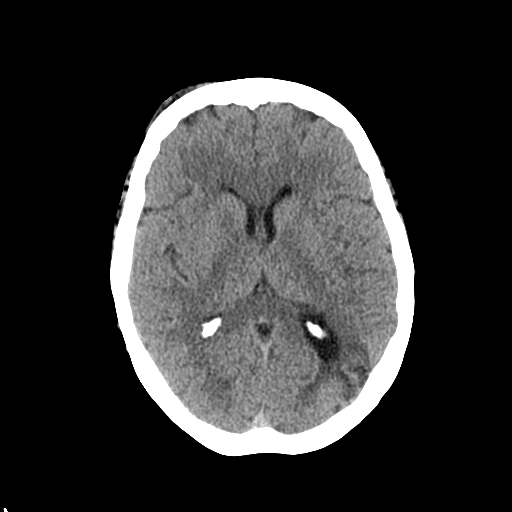
[im 17/31  brain]
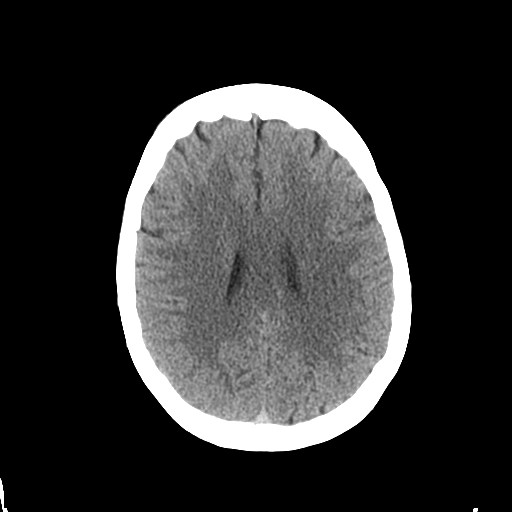
[im 17/31  bone]
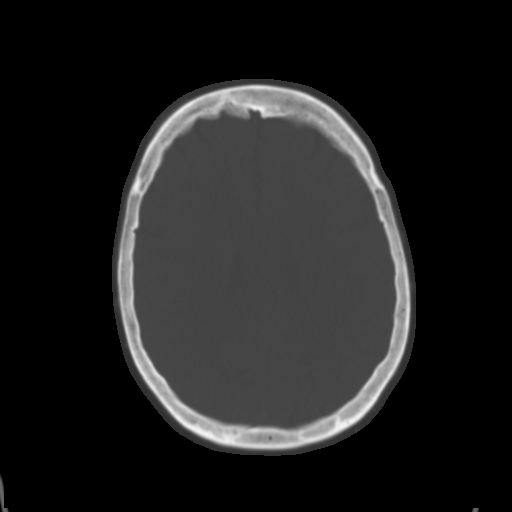
[im 21/31  brain]
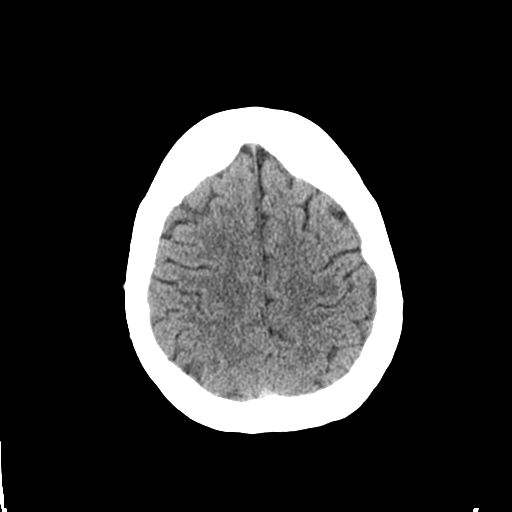
[im 24/31  brain]
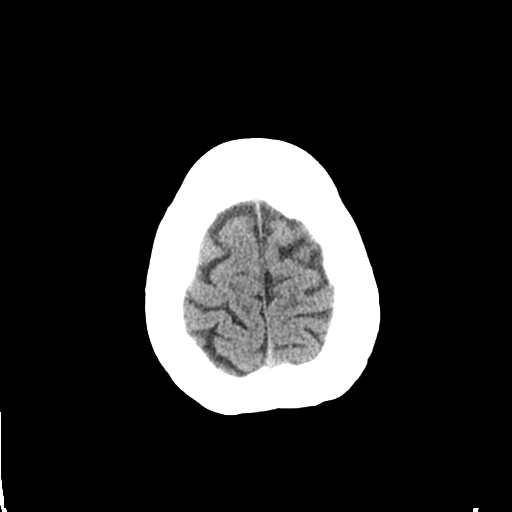
[im 28/31  brain]
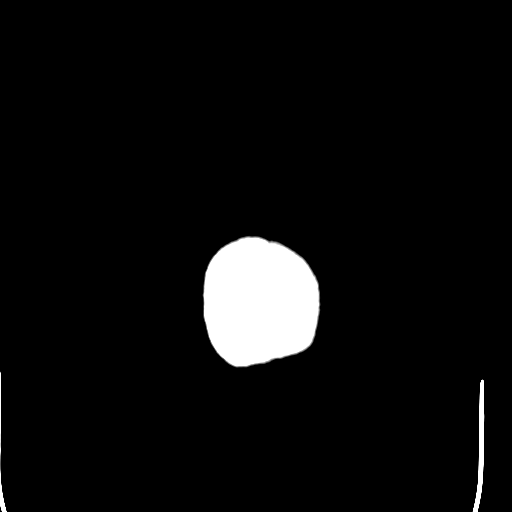

[Series 4: coronal soft tissue · coronal · 0.31mm/px · 3 of 63 slices shown]
[im 21/63  brain]
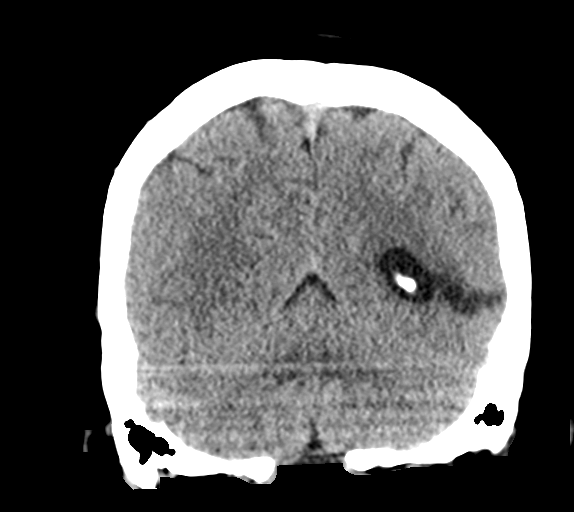
[im 28/63  brain]
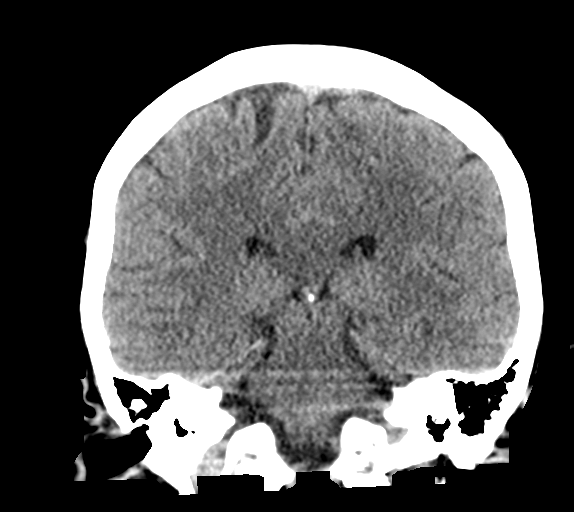
[im 35/63  brain]
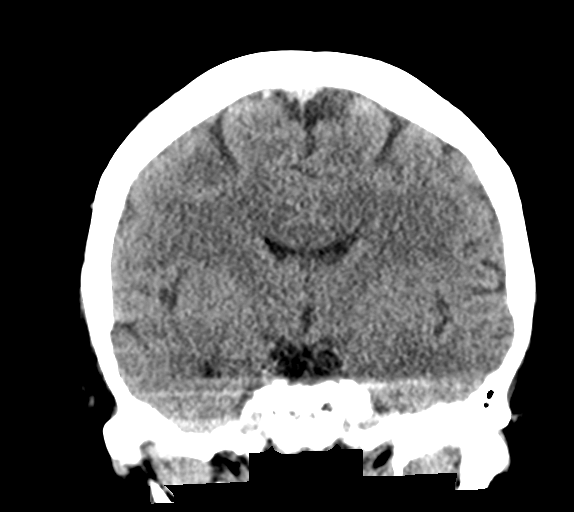

[Series 5: sagittal soft tissue · sagittal · 0.27mm/px · 3 of 50 slices shown]
[im 17/50  brain]
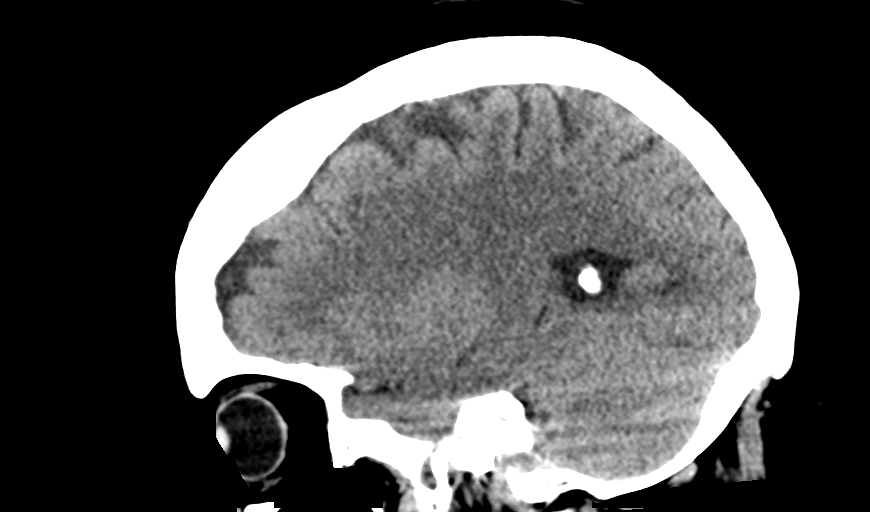
[im 25/50  brain]
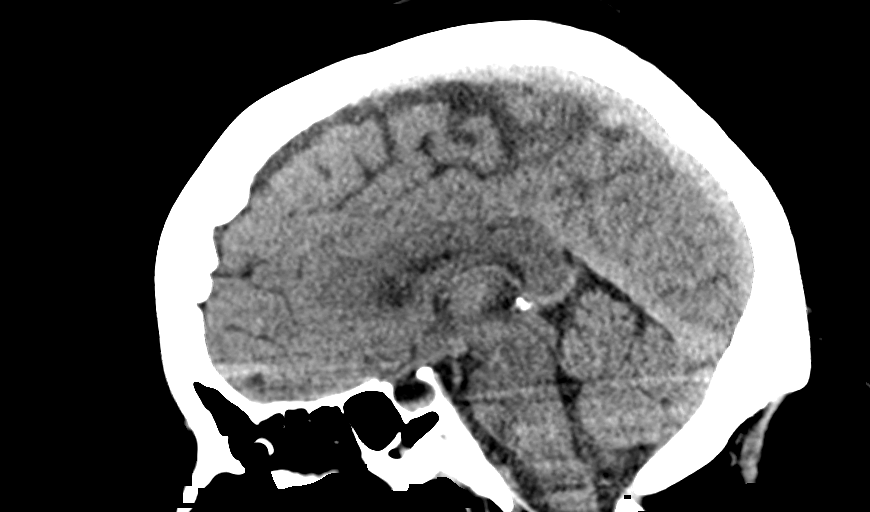
[im 33/50  brain]
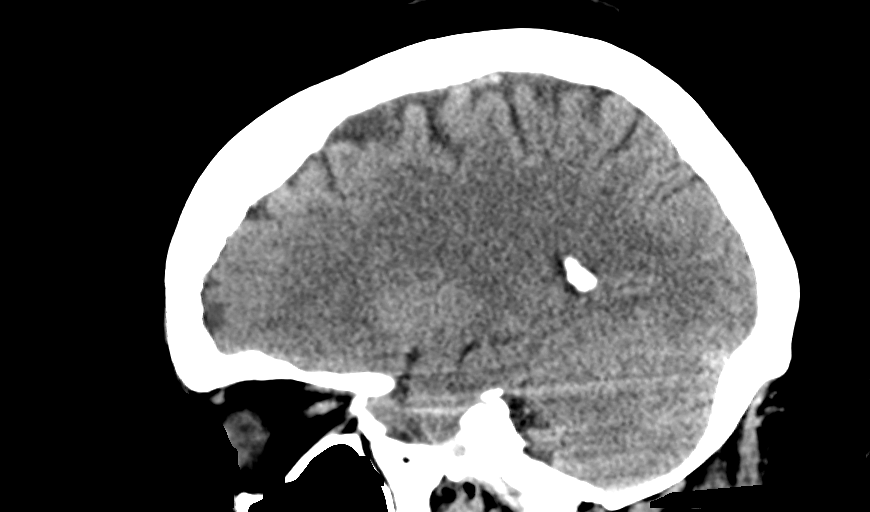

[14 of 47 positions shown; findings below may reference images not displayed]

FINDINGS: Brain: Stable encephalomalacia of the posterior left temporal lobe
at the level of prior old hemorrhage. No evidence of new hemorrhage,
infarct, mass effect, extra-axial fluid collection or hydrocephalus.

Vascular: No hyperdense vessel or unexpected calcification.

Skull: Normal. Negative for fracture or focal lesion.

Sinuses/Orbits: No acute finding.

Other: None.
IMPRESSION: Stable region of encephalomalacia in the posterior left temporal
lobe at the level of prior old parenchymal hemorrhage. No acute
findings.

## 2019-02-18 IMAGING — CR DG SHOULDER 2+V*R*
3 series · 3 of 3 positions shown · non-contrast
Comparison: None.

CLINICAL DATA: Fall with injury to right shoulder.

EXAM:
RIGHT SHOULDER - 2+ VIEW

[shoulder grashey]
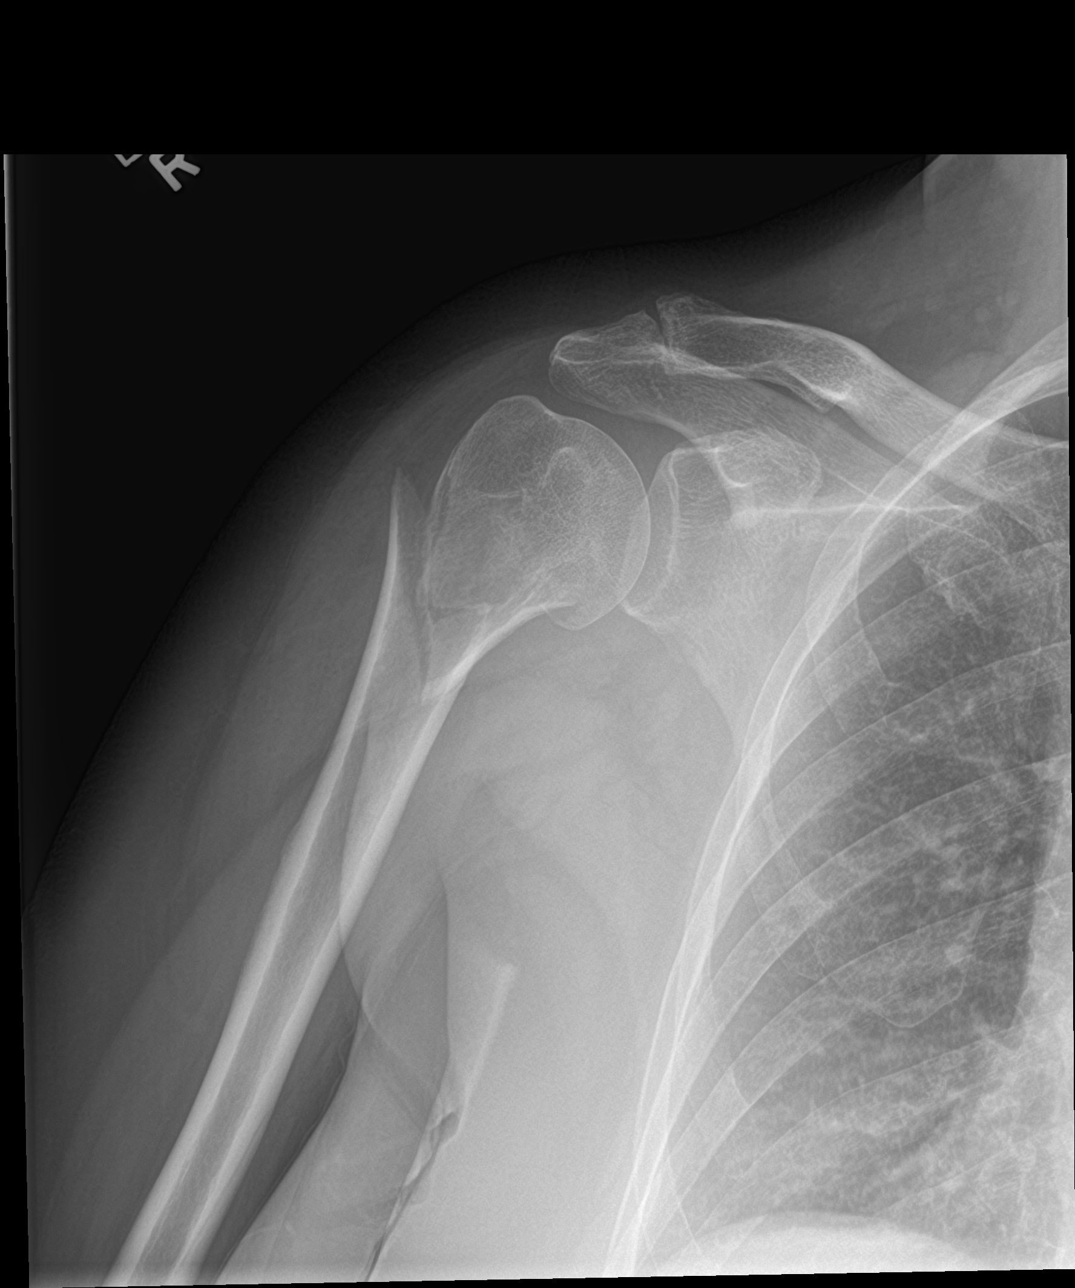

[shoulder y view]
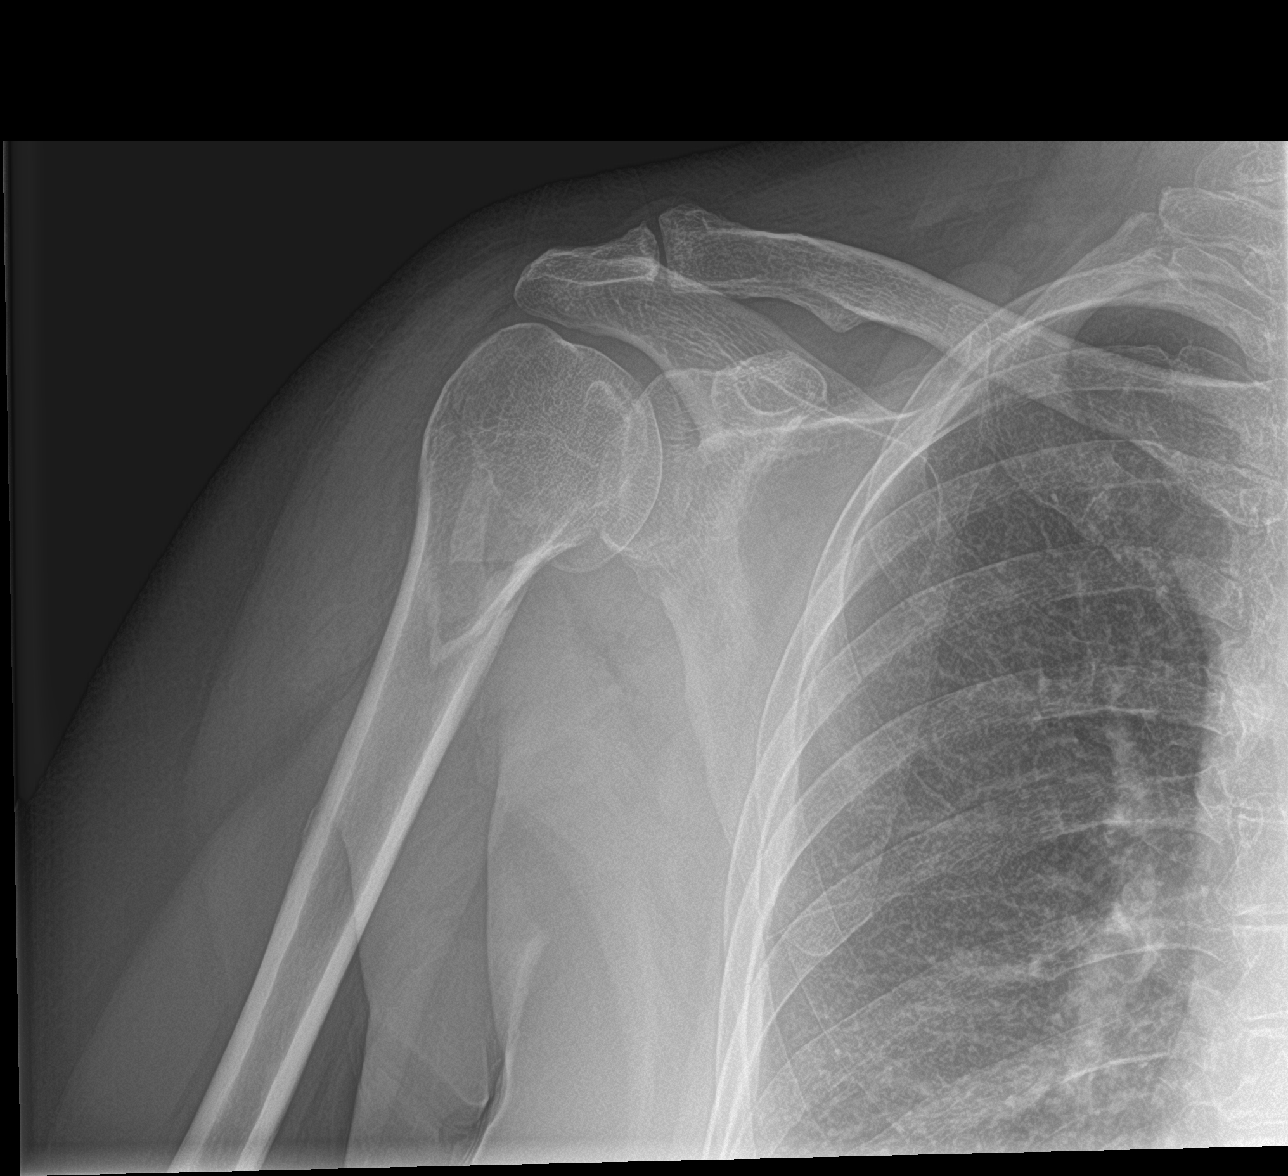

[shoulder axillary]
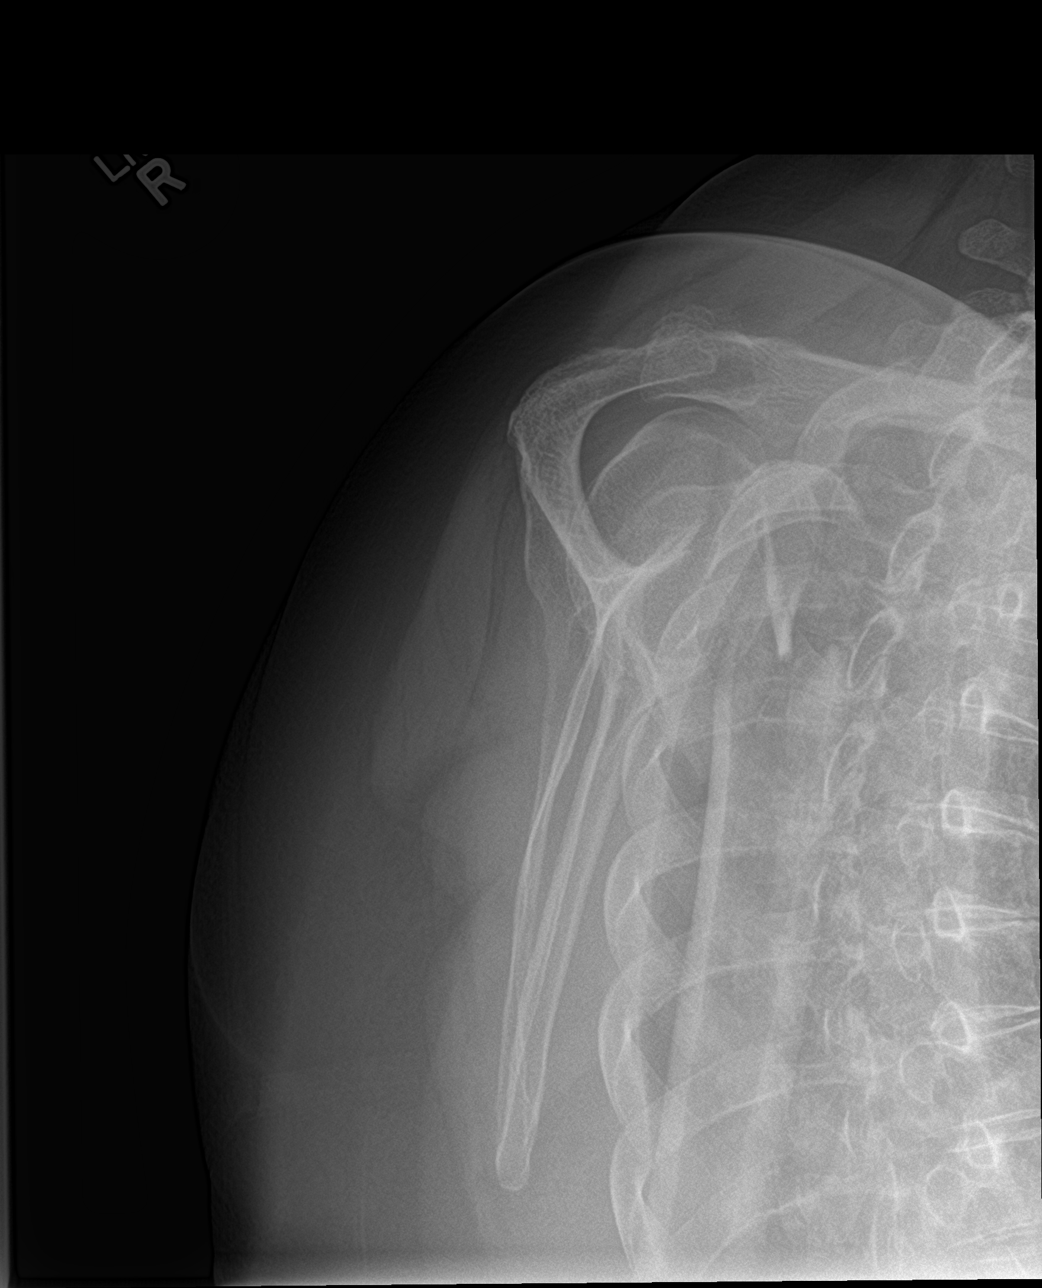

[3 of 3 positions shown; findings below may reference images not displayed]

FINDINGS: Acute comminuted fracture of the surgical neck of the proximal right
humerus is noted demonstrating angulation and mild displacement. No
dislocation identified. The AC joint shows normal alignment.
IMPRESSION: Comminuted, angulated and displaced fracture involving the surgical
neck of the right humerus.

## 2019-02-18 IMAGING — CR DG CHEST 1V
1 series · 1 of 1 positions shown · non-contrast
Comparison: Chest x-ray from abdominal series on 09/07/2016

CLINICAL DATA: Fall injury to right shoulder.

EXAM:
CHEST 1 VIEW

[chest ap]
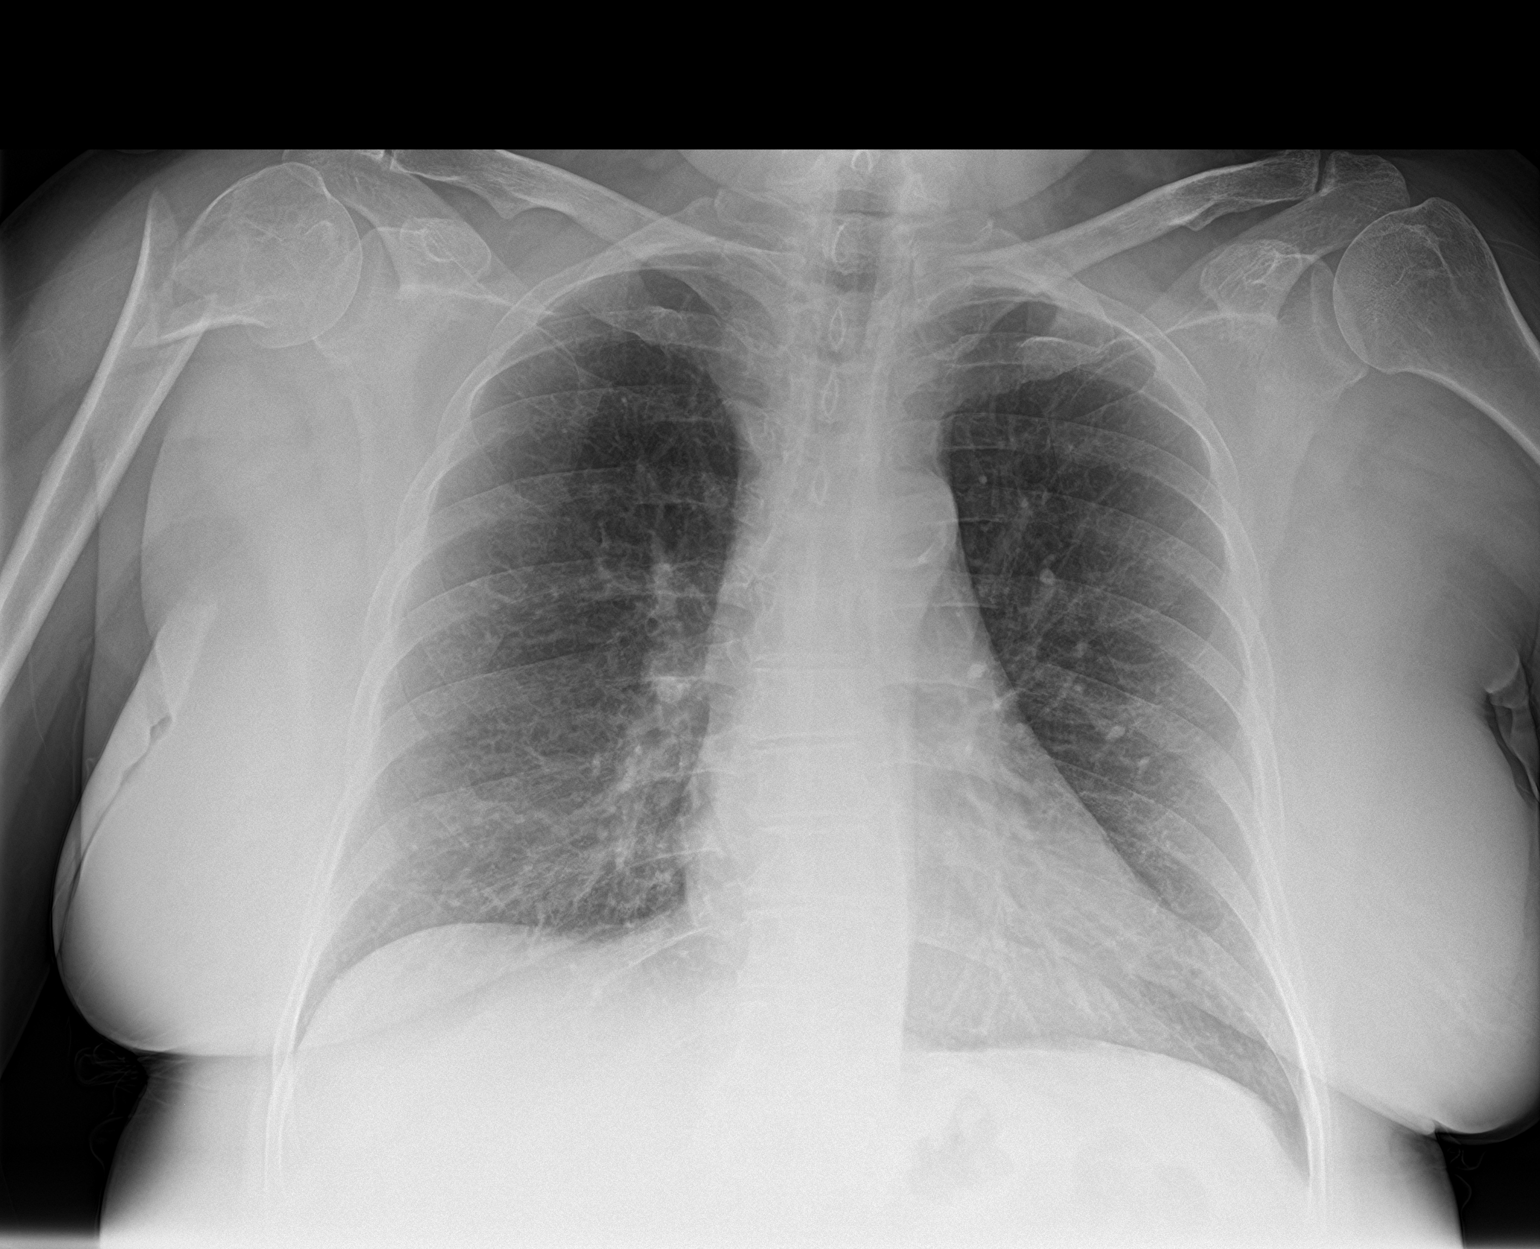

[1 of 1 positions shown; findings below may reference images not displayed]

FINDINGS: The heart size and mediastinal contours are within normal limits.
Stable mild interstitial prominence. There is no evidence of
pulmonary edema, consolidation, pneumothorax, nodule or pleural
fluid. The visualized skeletal structures show a comminuted and
displaced fracture involving the proximal neck of the right humerus.
IMPRESSION: No active disease.  Right proximal humeral neck fracture.

## 2019-03-02 ENCOUNTER — Other Ambulatory Visit: Payer: Self-pay

## 2019-03-02 ENCOUNTER — Ambulatory Visit
Payer: Medicaid Other | Attending: Student in an Organized Health Care Education/Training Program | Admitting: Student in an Organized Health Care Education/Training Program

## 2019-03-02 DIAGNOSIS — M5442 Lumbago with sciatica, left side: Secondary | ICD-10-CM

## 2019-03-02 DIAGNOSIS — G894 Chronic pain syndrome: Secondary | ICD-10-CM

## 2019-03-02 DIAGNOSIS — B182 Chronic viral hepatitis C: Secondary | ICD-10-CM | POA: Diagnosis not present

## 2019-03-02 DIAGNOSIS — I611 Nontraumatic intracerebral hemorrhage in hemisphere, cortical: Secondary | ICD-10-CM | POA: Diagnosis not present

## 2019-03-02 DIAGNOSIS — M5441 Lumbago with sciatica, right side: Secondary | ICD-10-CM

## 2019-03-02 DIAGNOSIS — Z79899 Other long term (current) drug therapy: Secondary | ICD-10-CM

## 2019-03-02 DIAGNOSIS — M546 Pain in thoracic spine: Secondary | ICD-10-CM

## 2019-03-02 DIAGNOSIS — G8929 Other chronic pain: Secondary | ICD-10-CM

## 2019-03-02 NOTE — Progress Notes (Signed)
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 atheromatous irregularity.  No aneurysm or vascular malformation. No vascular abnormality seen underlying the acute parenchymal hemorrhage.  IMPRESSION: MRI HEAD IMPRESSION:  1. Similar size and appearance of acute right temporal occipital intraparenchymal hematoma measuring 4.0 x 2.8 x 2.7 cm (estimated volume 15 cc). Similar localized edema with trace 3 mm left-to-right shift. 2. Intraventricular extension with blood in the lateral and third ventricles. No evidence for hydrocephalus or ventricular trapping. 3. Small volume acute subarachnoid hemorrhage within the posterior cerebral hemispheres bilaterally, stable from prior CT. 4. Trace left subdural hemorrhage measuring up to 2 mm, difficult to visualize on prior  study, but felt to be unchanged.  MRA HEAD IMPRESSION:  1. Negative intracranial MRA. No vascular abnormality identified underlying the acute hemorrhage. 2. No large or proximal arterial branch occlusion. No high-grade or correctable stenosis. 3. Multi focal atheromatous irregularity involving the anterior posterior circulations as above, predominantly involving the distal small vessels.   Electronically Signed   By: Jeannine Boga M.D.   On: 04/20/2016 03:52    Psychological-Psychiatric: Anxiousness and Depressed Gastrointestinal: Reflux or heatburn and Inflamed liver (Hepatitis) Genitourinary: Passing kidney stones Hematological: No reported hematological signs or symptoms such as prolonged bleeding, low or poor functioning platelets, bruising or bleeding easily, hereditary bleeding problems, low energy levels due to low hemoglobin or being anemic Endocrine: High blood sugar requiring insulin (IDDM) Rheumatologic: Generalized muscle aches (Fibromyalgia) Musculoskeletal: Negative for myasthenia gravis, muscular dystrophy, multiple sclerosis or malignant hyperthermia Work History: Disabled  Allergies  Stephanie [name] is allergic to gabapentin; tylenol [acetaminophen]; aspirin; buprenorphine hcl; codeine; compazine; ioxaglate; ivp dye [iodinated diagnostic agents]; metrizamide; naproxen; norco [hydrocodone-acetaminophen]; penicillin g; prochlorperazine maleate; reglan [metoclopramide]; sulfur; tegretol [carbamazepine]; toradol [ketorolac tromethamine]; tramadol; and zofran [ondansetron hcl].  Laboratory Chemistry   SAFETY SCREENING Profile Lab Results  Component Value Date   MRSAPCR NEGATIVE 08/22/2018   HIV Non Reactive 08/22/2017   PREGTESTUR NEGATIVE 10/21/2018   Inflammation Markers (CRP: Acute Phase) (ESR: Chronic Phase) Lab Results  Component Value Date  CRP 1.3 01/09/2017   ESRSEDRATE 7 01/09/2017   LATICACIDVEN 1.45 08/21/2018                          Rheumatology Markers Lab Results  Component Value Date   RF 10.5 06/27/2016                        Renal Function Markers Lab Results  Component Value Date   BUN 7 02/10/2019   CREATININE 0.57 02/10/2019   BCR 17 06/27/2016   GFRAA >60 02/10/2019   GFRNONAA >60 02/10/2019                             Hepatic Function Markers Lab Results  Component Value Date   AST 18 02/08/2019   ALT 30 02/08/2019   ALBUMIN 3.7 02/08/2019   ALKPHOS 104 02/08/2019   LIPASE 26 01/16/2019   AMMONIA 20 04/27/2016                        Electrolytes Lab Results  Component Value Date   NA 139 02/10/2019   K 4.1 02/10/2019   CL 106 02/10/2019   CALCIUM 9.0 02/10/2019   MG 2.1 11/20/2018   PHOS 2.3 (L) 08/22/2018                        Neuropathy Markers Lab Results  Component Value Date   HGBA1C 8.1 (H) 04/22/2016   HIV Non Reactive 08/22/2017                        CNS Tests No results found for: COLORCSF, APPEARCSF, RBCCOUNTCSF, WBCCSF, POLYSCSF, LYMPHSCSF, EOSCSF, PROTEINCSF, GLUCCSF, JCVIRUS, CSFOLI, IGGCSF, LABACHR, ACETBL                      Bone Pathology Markers No results found forMarveen Reeks [id number], [id number], 25OHVITD1, 25OHVITD2, 25OHVITD3, TESTOFREE, TESTOSTERONE                       Coagulation Parameters Lab Results  Component Value Date   INR 0.97 08/21/2018   LABPROT 12.8 08/21/2018   APTT 30 06/21/2016   PLT 216 02/10/2019   DDIMER 0.45 01/16/2019                        Cardiovascular Markers Lab Results  Component Value Date   BNP 45.9 08/21/2018   CKTOTAL 137 08/22/2018   TROPONINI <0.03 08/22/2018   HGB 12.9 02/10/2019   HCT 39.4 02/10/2019                         ID Test(s) Lab Results  Component Value Date   HIV Non Reactive 08/22/2017   MRSAPCR NEGATIVE 08/22/2018   PREGTESTUR NEGATIVE 10/21/2018   MICROTEXT  03/30/2014       SOURCE: CLEAN CATCH    COMMENT                   MIXED BACTERIAL ORGANISMS   COMMENT                    RESULTS SUGGESTIVE OF CONTAMINATION   ANTIBIOTIC  CA Markers No results found for: CEA, CA125, LABCA2                      Endocrine Markers Lab Results  Component Value Date   TSH 1.241 08/21/2018   FREET4 0.92 04/18/2016                        Note: Lab results reviewed.  Imaging Review  Cervical Imaging:  Cervical CT wo contrast:  Results for orders placed during the hospital encounter of 08/21/18  CT Cervical Spine Wo Contrast   Narrative CLINICAL DATA:  Altered mental status.  History of stroke.  EXAM: CT HEAD WITHOUT CONTRAST  CT CERVICAL SPINE WITHOUT CONTRAST  TECHNIQUE: Multidetector CT imaging of the head and cervical spine was performed following the standard protocol without intravenous contrast. Multiplanar CT image reconstructions of the cervical spine were also generated.  COMPARISON:  CT HEAD July 22, 2018  FINDINGS: CT HEAD FINDINGS  BRAIN: No intraparenchymal hemorrhage, mass effect nor midline shift. LEFT occipital lobe encephalomalacia with ex vacuo dilatation subjacent ventricle. No acute large vascular territory infarcts. No abnormal extra-axial fluid collections. Basal cisterns are patent.  VASCULAR: Mild calcific atherosclerosis carotid siphon.  SKULL/SOFT TISSUES: No skull fracture. No significant soft tissue swelling.  ORBITS/SINUSES: The included ocular globes and orbital contents are normal.Moderate lobulated paranasal sinus mucosal thickening. Mastoid air cells are well aerated.  OTHER: Life support lines in place. Subcutaneous gas within included face soft tissues. Multiple dental caries.  CT CERVICAL SPINE FINDINGS  ALIGNMENT: Straightened lordosis.  Vertebral bodies in alignment.  SKULL BASE AND VERTEBRAE: Cervical vertebral bodies and posterior elements are intact. Moderate C5-6 and mild C6-7 disc height loss with endplate spurring compatible with  degenerative discs. T1 and T2 Schmorl's nodes. No destructive bony lesions. C1-2 articulation maintained.  SOFT TISSUES AND SPINAL CANAL: Life support lines in place. Mild calcific atherosclerosis carotid bifurcations. RIGHT greater than LEFT anterior neck subcutaneous fat stranding, possibly postprocedural. Small volume anterior neck subcutaneous gas.  DISC LEVELS: No significant osseous canal stenosis or neural foraminal narrowing.  UPPER CHEST: Lung apices are clear.  OTHER: None.  IMPRESSION: CT HEAD:  1. No acute intracranial process. 2. Stable examination including old LEFT occipital lobe infarct.  CT CERVICAL SPINE:  1. No fracture or malalignment. 2. Life support lines in place.   Electronically Signed   By: Elon Alas M.D.   On: 08/22/2018 00:51    Results for orders placed during the hospital encounter of 04/18/17  DG Shoulder Right   Narrative CLINICAL DATA:  Right shoulder pain since the surgery for a proximal humerus fracture 1 month ago.  EXAM: RIGHT SHOULDER - 2+ VIEW  COMPARISON:  01/18/2017 and 03/11/2017.  FINDINGS: Intramedullary rod and screw fixation of the previously demonstrated comminuted proximal humerus fracture. The fracture lines are still visible with no interval bridging callus. No acute fracture or dislocation seen.  IMPRESSION: Stable hardware fixation of a comminuted proximal right humerus fracture with no interval healing.   Electronically Signed   By: Claudie Revering M.D.   On: 04/18/2017 18:31    Thoracic DG 2-3 views:  Results for orders placed during the hospital encounter of 02/14/18  DG Thoracic Spine 2 View   Narrative CLINICAL DATA:  Status post fall few days ago with back pain  EXAM: THORACIC SPINE 2 VIEWS  COMPARISON:  Chest x-ray April 11, 2017  FINDINGS: There is no evidence of thoracic  spine fracture. Scoliosis of the spine is noted. Degenerative joint changes with anterior osteophytosis and  narrowed joint space are identified.  IMPRESSION: No acute fracture or dislocation. Osteoarthritic changes of thoracic spine.   Electronically Signed   By: Abelardo Diesel M.D.   On: 02/14/2018 16:17    Thoracic DG 4 views: No results found for this or any previous visit. Thoracic DG: No results found for this or any previous visit. Thoracic DG w/swimmers view:  Results for orders placed during the hospital encounter of 03/03/18  [facility] Thoracic Spine W/Swimmers   Narrative CLINICAL DATA:  Pain after fall today.  EXAM: THORACIC SPINE - 3 VIEWS  COMPARISON:  None.  FINDINGS: There is no evidence of thoracic spine fracture. There are 12 ribbed thoracic vertebrae. Mild multilevel disc flattening is noted with small anterior osteophytes compatible mild degenerative change of the thoracic spine. No acute fracture, malalignment nor bone destruction.  IMPRESSION: Mild degenerative change of the thoracic spine without acute osseous appearing abnormality.   Electronically Signed   By: Ashley Royalty M.D.   On: 03/03/2018 19:29     Results for orders placed during the hospital encounter of 03/03/18  DG Lumbar Spine Complete   Narrative CLINICAL DATA:  Patient fell down 2 stairs at home today.  Pain.  EXAM: LUMBAR SPINE - COMPLETE 4+ VIEW  COMPARISON:  None.  FINDINGS: There are 5 non ribbed lumbar vertebrae in normal lumbar lordosis. No acute lumbar spine fracture, pars defects or listhesis. Moderate disc space narrowing is identified at L5-S1. Probable old limbus vertebra of L5 along the anterior superior aspect. Facet sclerosis from L3 through S1 compatible with degenerative facet arthropathy.  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 No acute fracture or listhesis.   Electronically Signed   By: Ashley Royalty M.D.   On: 03/03/2018 19:27         Foot-L DG Complete:  Results for orders placed during the hospital encounter of  04/10/09  DG Foot Complete Left   Narrative Clinical Data: Left foot pain.   LEFT FOOT - COMPLETE 3+ VIEW   Comparison: None   Findings: Three views of the left foot were obtained.  There is some irregularity along the lateral aspect of the fifth toe PIP joint which could be degenerative in etiology.  Otherwise, there are no acute bony abnormalities.  Normal alignment of the left foot.   IMPRESSION: No acute bony abnormality to the left foot.   Probable degenerative changes of the fifth toe PIP joint.  Provider: Marcelino Scot, Lendon Collar    Results for orders placed during the hospital encounter of 07/30/13  DG Wrist Complete Left   Narrative CLINICAL DATA:  [place] catching self with left hand, left hand and wrist pain  EXAM: LEFT WRIST - COMPLETE 3+ VIEW  COMPARISON:  None  FINDINGS: Osseous mineralization grossly normal for technique.  Joint spaces preserved.  No acute fracture, dislocation or bone destruction.  IMPRESSION: No acute osseous abnormalities.   Electronically Signed   By: Lavonia Dana M.D.   On: 07/30/2013 17:04     Hand-L DG Complete:  Results for orders placed during the hospital encounter of 10/21/17  DG Hand Complete Left   Narrative CLINICAL DATA:  Left hand pain after fall yesterday.  EXAM: LEFT HAND - COMPLETE 3+ VIEW  COMPARISON:  Third digit radiograph earlier this day at 1947 hour. Hand radiograph 12/17/2013  FINDINGS: There is no evidence of acute fracture or  dislocation. Linear lucency near the tip of the third digit distal phalanx is unchanged from 2015 exam. No significant arthropathy. Soft tissues are unremarkable.  IMPRESSION: No fracture or acute osseous abnormality of the left hand.   Electronically Signed   By: Jeb Levering M.D.   On: 10/21/2017 23:28     Complexity Note: Imaging results reviewed. Results shared with Ms. Hardin Negus, using [employer].                         [place]  Drug: Ms.  Budreau  reports no history of drug use. Alcohol:  reports previous alcohol use. Tobacco:  reports that she has been smoking cigarettes. She has a 1.50 pack-year smoking history. She has never used smokeless tobacco. Medical:  has a past medical history of Diabetes mellitus without complication ([place]), Difficult intubation, Diverticulitis, Headache, Hepatitis C, History of kidney stones, Hypertension, IBS (irritable bowel syndrome), Kidney stones, Sciatica, Sleep apnea, and Stroke ([place]) (2017). Family: family history includes Cancer in her sister; Diabetes in her father; Heart failure in her mother.  Past Surgical History:  Procedure Laterality Date  . ABDOMINAL HYSTERECTOMY    . carpel tunnel syndrome  2017  . HARDWARE REMOVAL Right 12/22/2017   Procedure: HARDWARE REMOVAL-RIGHT ARM;  Surgeon: Lovell Sheehan, MD;  Location: [facility] ORS;  Service: Orthopedics;  Laterality: Right;  Removal of implant, superficial right humerus  . HUMERUS IM NAIL Right 03/11/2017   Procedure: INTRAMEDULLARY (IM) NAIL HUMERAL;  Surgeon: Lovell Sheehan, MD;  Location: [facility] ORS;  Service: Orthopedics;  Laterality: Right;  . KIDNEY STONE SURGERY Right 02/12/12  . KIDNEY STONE SURGERY Left 07/15/2012  . LIGATION OF ARTERIOVENOUS  FISTULA Left 06/21/2016   Procedure: LIGATION OF ARTERIOVENOUS  FISTULA ( LIGATION BASILIC VEIN );  Surgeon: Katha Cabal, MD;  Location: [facility] ORS;  Service: Vascular;  Laterality: Left;  . LITHOTRIPSY    . OOPHORECTOMY    . SINUS EXPLORATION    . TONSILLECTOMY     Active Ambulatory Problems    Diagnosis Date Noted  . Hepatitis C   . IVH (intraventricular hemorrhage) ([place]) 04/18/2016  . Essential hypertension, malignant 04/19/2016  . Cytotoxic brain edema ([place]) 04/19/2016  . ICH (intracerebral hemorrhage) ([place])   . Pain and swelling of left upper extremity 07/22/2016  . Superficial venous thrombosis of arm, left 07/22/2016  . Small bowel obstruction ([place]) 09/02/2016  . Headache  01/07/2017  . UTI (urinary tract infection) 08/21/2017  . Nausea & vomiting 08/21/2017  . Nicotine dependence 08/21/2017  . Shoulder pain, right 12/22/2017  . Acute encephalopathy 08/21/2018  . Hypotension 08/22/2018  . Bradycardia 08/22/2018  . Type 2 diabetes mellitus ([place]) 08/22/2018   Resolved Ambulatory Problems    Diagnosis Date Noted  . Cerebral venous thrombosis    Past Medical History:  Diagnosis Date  . Diabetes mellitus without complication ([place])   . Difficult intubation   . Diverticulitis   . History of kidney stones   . Hypertension   . IBS (irritable bowel syndrome)   . Kidney stones   . Sciatica   . Sleep apnea   . Stroke [facility]) 2017   Assessment  Primary Diagnosis & Pertinent Problem List: The primary encounter diagnosis was Chronic bilateral low back pain with bilateral sciatica. Diagnoses of Thoracic spine pain, Chronic hepatitis C without hepatic coma ([place]), Nontraumatic cortical hemorrhage of left cerebral hemisphere [facility]), Chronic prescription benzodiazepine use, and Chronic pain syndrome were also pertinent to  this visit.  Visit Diagnosis (New problems to examiner): 1. Chronic bilateral low back pain with bilateral sciatica   2. Thoracic spine pain   3. Chronic hepatitis C without hepatic coma ([place])   4. Nontraumatic cortical hemorrhage of left cerebral hemisphere ([place])   5. Chronic prescription benzodiazepine use   6. Chronic pain syndrome     53 year old female with a complex medical history including history of 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  Patient states that she has trouble with recall.  She states that she was seen at the [facility] pain medicine clinic in the past.  I do not see any records of this.  She states that she has had multiple injections including epidurals,  nerve blocks performed.  She does not recall laterality and or level.  She is unable to recall how effective they were.  Patient is being seen by neurology, Dr. Brigitte Pulse for migraine management.  They are trying sphenopalatine ganglion nerve blocks.  I informed the patient that this will be a very limited evaluation as we need records from the [facility] pain medicine clinic to review before developing a treatment plan.  I informed the patient that she would not be a candidate for opioid therapy here given her prior history of substance abuse, memory loss, obstructive sleep apnea which increases her risk of substance abuse/misuse as well as adverse reactions. Patient is also on Xanax as well as Ambien 2 mg twice daily as needed in the context of obesity and obstructive sleep apnea.  This is another reason that she would not be a candidate for opioid management here.   Patient endorsed understanding.  She said that she will contact us after she has requested [facility] to send over records.  After reviewing records I can discuss interventional treatment plan with patient.   I had extensive discussion with the patient about the goals of pain management.  We discussed nonpharmacological approaches to pain management that include physical therapy, dieting, sleep hygiene, psychotherapy, interventional therapy.  We discussed the importance of understanding the type of pain including neuropathic, nociceptive, centralized.  I also stressed the importance of multimodal analgesia with an emphasis on nondrug modalities including self management, behavioral health support and physical therapy.  We discussed the importance of physical therapy and how a individualized physical therapy and occupational therapy program tailored to patient limitations can be helpful at improving physical function. We also discussed the importance of insomnia and disrupted sleep and how improved sleep hygiene and cognitive therapy could be helpful.  Psychotherapy  including CBT, mind-body therapies, pain coping strategies can be helpful for patients whose pain impacts mood, sleep, quality of life, relationships with others.  We discussed avoiding benzodiazepines.  I also had an extensive discussion with the patient about interventional therapies which is my expertise and how these could be incorporated into an effective multimodal pain management plan after we obtain records from her previous pain clinic to further understand what interventional procedures have been done.   Pharmacological management options:  Opioid Analgesics: Not a candidate  Membrane stabilizer: To be determined at a later time  Muscle relaxant: To be determined at a later time  NSAID: To be determined at a later time  Other analgesic(s): To be determined at a later time   Interventional management options: Stephanie [name] was informed that there is  no guarantee that she would be a candidate for interventional therapies. The decision will be based on the results of diagnostic studies, as well as Stephanie [name]'s risk profile.  Procedure(s) under consideration:  TBD pending records    Total duration of non-face-to-face encounter:54mnutes.  Primary Care Physician: BDanelle Berry NP [facility] Note by: BGillis Santa MD Date: 03/02/2019; Time: 9:18 AM  Note: This dictation was prepared with Dragon dictation. Any transcriptional errors that may result from this process are unintentional.

## 2019-03-03 ENCOUNTER — Encounter: Payer: Self-pay | Admitting: Student in an Organized Health Care Education/Training Program

## 2019-03-05 ENCOUNTER — Emergency Department (HOSPITAL_COMMUNITY)
Admission: EM | Admit: 2019-03-05 | Discharge: 2019-03-05 | Discharge status: Against Medical Advice | Payer: Medicaid Other | Source: Home / Self Care

## 2019-03-05 ENCOUNTER — Emergency Department (HOSPITAL_COMMUNITY)
Admission: EM | Admit: 2019-03-05 | Discharge: 2019-03-05 | Disposition: A | Discharge status: Home / Self Care | Payer: Medicaid Other | Attending: Emergency Medicine | Admitting: Emergency Medicine

## 2019-03-05 ENCOUNTER — Encounter (HOSPITAL_COMMUNITY): Payer: Self-pay | Admitting: Emergency Medicine

## 2019-03-05 ENCOUNTER — Encounter (HOSPITAL_COMMUNITY): Payer: Self-pay

## 2019-03-05 ENCOUNTER — Other Ambulatory Visit: Payer: Self-pay

## 2019-03-05 DIAGNOSIS — M549 Dorsalgia, unspecified: Secondary | ICD-10-CM | POA: Insufficient documentation

## 2019-03-05 DIAGNOSIS — Z5321 Procedure and treatment not carried out due to patient leaving prior to being seen by health care provider: Secondary | ICD-10-CM | POA: Insufficient documentation

## 2019-03-05 LAB — COMPREHENSIVE METABOLIC PANEL
ALT: 18 U/L (ref 0–44)
AST: 22 U/L (ref 15–41)
Albumin: 3.9 g/dL (ref 3.5–5.0)
Alkaline Phosphatase: 100 U/L (ref 38–126)
Anion gap: 11 (ref 5–15)
BUN: 10 mg/dL (ref 6–20)
CO2: 18 mmol/L — ABNORMAL LOW (ref 22–32)
Calcium: 9.5 mg/dL (ref 8.9–10.3)
Chloride: 105 mmol/L (ref 98–111)
Creatinine, Ser: 0.86 mg/dL (ref 0.44–1.00)
GFR calc Af Amer: >60 mL/min (ref >60)
GFR calc non Af Amer: >60 mL/min (ref >60)
Glucose, Bld: 402 mg/dL — ABNORMAL HIGH (ref 70–99)
Potassium: 4.1 mmol/L (ref 3.5–5.1)
Sodium: 134 mmol/L — ABNORMAL LOW (ref 135–145)
Total Bilirubin: 0.3 mg/dL (ref 0.3–1.2)
Total Protein: 7 g/dL (ref 6.5–8.1)

## 2019-03-05 LAB — CBC WITH DIFFERENTIAL/PLATELET
Abs Immature Granulocytes: 0.02 10*3/uL (ref 0.00–0.07)
Basophils Absolute: 0 10*3/uL (ref 0.0–0.1)
Basophils Relative: 0 %
Eosinophils Absolute: 0 10*3/uL (ref 0.0–0.5)
Eosinophils Relative: 0 %
HCT: 43.9 % (ref 36.0–46.0)
Hemoglobin: 13.9 g/dL (ref 12.0–15.0)
Immature Granulocytes: 0 %
Lymphocytes Relative: 10 %
Lymphs Abs: 0.6 10*3/uL — ABNORMAL LOW (ref 0.7–4.0)
MCH: 29.7 pg (ref 26.0–34.0)
MCHC: 31.7 g/dL (ref 30.0–36.0)
MCV: 93.8 fL (ref 80.0–100.0)
Monocytes Absolute: 0.2 10*3/uL (ref 0.1–1.0)
Monocytes Relative: 3 %
Neutro Abs: 4.9 10*3/uL (ref 1.7–7.7)
Neutrophils Relative %: 87 %
Platelets: 198 10*3/uL (ref 150–400)
RBC: 4.68 MIL/uL (ref 3.87–5.11)
RDW: 13.6 % (ref 11.5–15.5)
WBC: 5.7 10*3/uL (ref 4.0–10.5)
nRBC: 0 % (ref 0.0–0.2)

## 2019-03-05 LAB — URINALYSIS, ROUTINE W REFLEX MICROSCOPIC
Bilirubin Urine: NEGATIVE
Glucose, UA: >=500 mg/dL — AB
Hgb urine dipstick: NEGATIVE
Ketones, ur: NEGATIVE mg/dL
Leukocytes,Ua: NEGATIVE
Nitrite: NEGATIVE
Protein, ur: NEGATIVE mg/dL
Specific Gravity, Urine: 1.02 (ref 1.005–1.030)
pH: 6 (ref 5.0–8.0)

## 2019-03-05 NOTE — ED Notes (Signed)
Pt left due to wait, ambulatory with steady gait

## 2019-03-05 NOTE — ED Triage Notes (Signed)
Patient reports upper/lower back pain onset Monday this week , history of sciatica and renal stones , denies hematuria or dysuria , no fever or chills .

## 2019-03-05 NOTE — ED Notes (Signed)
Pt does not want to wait any longer. Pt gave labels and bp cuff back to registration.

## 2019-03-05 NOTE — ED Triage Notes (Addendum)
Pt reports upper and lower back pain along her spine that started Monday. States that the pain also shoots down her L leg. She states that she has several kidney stones as well with planned surgery later this month. Hx of sciatica.  Ambulatory.

## 2019-03-07 ENCOUNTER — Other Ambulatory Visit: Payer: Self-pay

## 2019-03-07 ENCOUNTER — Emergency Department (HOSPITAL_COMMUNITY)
Admission: EM | Admit: 2019-03-07 | Discharge: 2019-03-07 | Disposition: A | Discharge status: Home / Self Care | Payer: Medicaid Other | Attending: Emergency Medicine | Admitting: Emergency Medicine

## 2019-03-07 ENCOUNTER — Encounter (HOSPITAL_COMMUNITY): Payer: Self-pay

## 2019-03-07 DIAGNOSIS — Z8673 Personal history of transient ischemic attack (TIA), and cerebral infarction without residual deficits: Secondary | ICD-10-CM | POA: Insufficient documentation

## 2019-03-07 DIAGNOSIS — G894 Chronic pain syndrome: Secondary | ICD-10-CM | POA: Diagnosis not present

## 2019-03-07 DIAGNOSIS — Z7984 Long term (current) use of oral hypoglycemic drugs: Secondary | ICD-10-CM | POA: Insufficient documentation

## 2019-03-07 DIAGNOSIS — E119 Type 2 diabetes mellitus without complications: Secondary | ICD-10-CM | POA: Diagnosis not present

## 2019-03-07 DIAGNOSIS — F17210 Nicotine dependence, cigarettes, uncomplicated: Secondary | ICD-10-CM | POA: Diagnosis not present

## 2019-03-07 DIAGNOSIS — Z79899 Other long term (current) drug therapy: Secondary | ICD-10-CM | POA: Insufficient documentation

## 2019-03-07 DIAGNOSIS — R222 Localized swelling, mass and lump, trunk: Secondary | ICD-10-CM | POA: Diagnosis present

## 2019-03-07 MED ORDER — LOPERAMIDE HCL 2 MG PO CAPS
4.0000 mg | ORAL_CAPSULE | ORAL | Status: DC | PRN
  Administered 2019-03-07: 4 mg via ORAL
  Filled 2019-03-07: qty 2

## 2019-03-07 MED ORDER — HALOPERIDOL 5 MG PO TABS
5.0000 mg | ORAL_TABLET | Freq: Once | ORAL | Status: AC
  Administered 2019-03-07: 5 mg via ORAL
  Filled 2019-03-07: qty 1

## 2019-03-07 MED ORDER — LIDOCAINE 5 % EX PTCH
2.00 | MEDICATED_PATCH | CUTANEOUS | Status: DC
Start: 2019-03-07 — End: 2019-03-07

## 2019-03-07 NOTE — ED Notes (Signed)
Pt ambulatory without assistance to restroom 

## 2019-03-07 NOTE — ED Provider Notes (Addendum)
[facility]-EMERGENCY DEPT Provider Note   CSN: [id number] Arrival date & time: 03/07/19  1336    History   Chief Complaint Chief Complaint  Patient presents with  . Abdominal Pain    HPI Stephanie [name] is a 53 y.o. female.     HPI   She presents for evaluation of painful condition present for 1 week, without trauma.  She complains of pain in her right trapezius region which radiates to her right shoulder, abdominal pain, diarrhea, and nausea.  She was seen twice for the same problem yesterday and emergency departments in [place], in [place].  She states she did not get any relief from the medications they gave her including, her report " ketamine and ibuprofen."  On the second visit yesterday she was treated with Tylenol, ibuprofen, lidocaine patch, and Robaxin.  She feels like she is itching after taking the ibuprofen.  She recently saw pain management doctor who deferred therapeutic decision making, until he receives additional past medical documentation from her providers.  She denies fever, chills, cough, weakness or dizziness.  Past Medical History:  Diagnosis Date  . Diabetes mellitus without complication (HCC)   . Difficult intubation   . Diverticulitis   . Headache   . Hepatitis C    treated and resolved  . History of kidney stones   . Hypertension   . IBS (irritable bowel syndrome)   . Kidney stones   . Sciatica   . Sleep apnea    on cpap  . Stroke [facility](HCC) 2017   memory loss    Patient Active Problem List   Diagnosis Date Noted  . Hypotension 08/22/2018  . Bradycardia 08/22/2018  . Type 2 diabetes mellitus (HCC) 08/22/2018  . Acute encephalopathy 08/21/2018  . Shoulder pain, right 12/22/2017  . UTI (urinary tract infection) 08/21/2017  . Nausea & vomiting 08/21/2017  . Nicotine dependence 08/21/2017  . Headache 01/07/2017  . Small bowel obstruction (HCC) 09/02/2016  . Pain and swelling of left upper extremity  07/22/2016  . Superficial venous thrombosis of arm, left 07/22/2016  . ICH (intracerebral hemorrhage) (HCC)   . Essential hypertension, malignant 04/19/2016  . Cytotoxic brain edema (HCC) 04/19/2016  . IVH (intraventricular hemorrhage) (HCC) 04/18/2016  . Hepatitis C     Past Surgical History:  Procedure Laterality Date  . ABDOMINAL HYSTERECTOMY    . carpel tunnel syndrome  2017  . HARDWARE REMOVAL Right 12/22/2017   Procedure: HARDWARE REMOVAL-RIGHT ARM;  Surgeon: Lyndle HerrlichBowers, James R, MD;  Location: [facility] ORS;  Service: Orthopedics;  Laterality: Right;  Removal of implant, superficial right humerus  . HUMERUS IM NAIL Right 03/11/2017   Procedure: INTRAMEDULLARY (IM) NAIL HUMERAL;  Surgeon: Lyndle HerrlichBowers, James R, MD;  Location: [facility] ORS;  Service: Orthopedics;  Laterality: Right;  . KIDNEY STONE SURGERY Right 02/12/12  . KIDNEY STONE SURGERY Left 07/15/2012  . LIGATION OF ARTERIOVENOUS  FISTULA Left 06/21/2016   Procedure: LIGATION OF ARTERIOVENOUS  FISTULA ( LIGATION BASILIC VEIN );  Surgeon: Renford DillsGregory G Schnier, MD;  Location: [facility] ORS;  Service: Vascular;  Laterality: Left;  . LITHOTRIPSY    . OOPHORECTOMY    . SINUS EXPLORATION    . TONSILLECTOMY       OB History   No obstetric history on file.      Home Medications    Prior to Admission medications   Medication Sig Start Date End Date Taking? Authorizing Provider  alprazolam Prudy Feeler(XANAX) 2 MG tablet Take 2 mg  by mouth 2 (two) times daily as needed for anxiety. 10/13/18  Yes [provider]  atorvastatin (LIPITOR) 40 MG tablet Take 40 mg by mouth daily. 12/10/18  Yes [provider]  budesonide-formoterol (SYMBICORT) 160-4.5 MCG/ACT inhaler Inhale 2 puffs into the lungs 2 (two) times daily.   Yes [provider]  butalbital-acetaminophen-caffeine (FIORICET) 50-325-40 MG tablet Take 1 tablet by mouth 2 (two) times daily as needed for headache.   Yes [provider]  cloNIDine (CATAPRES) 0.2 MG tablet Take 0.2 mg  by mouth 2 (two) times daily. 10/02/18  Yes [provider]  clopidogrel (PLAVIX) 75 MG tablet Take 1 tablet (75 mg total) by mouth daily. 06/27/16  Yes Marvel PlanXu, Jindong, MD  FLUoxetine (PROZAC) 40 MG capsule Take 40 mg by mouth daily.  06/28/18  Yes [provider]  Galcanezumab-gnlm (EMGALITY) 120 MG/ML SOSY Inject 120 mg into the skin every 30 (thirty) days.   Yes [provider]  glipiZIDE (GLUCOTROL) 5 MG tablet Take 1 tablet (5 mg total) by mouth 2 (two) times daily before a meal. 04/30/16  Yes Lora PaulaKrall, Jennifer T, MD  hydrALAZINE (APRESOLINE) 50 MG tablet Take 50 mg by mouth 3 (three) times daily. 12/08/18  Yes [provider]  hydrochlorothiazide (HYDRODIURIL) 50 MG tablet Take 50 mg by mouth 2 (two) times a day.   Yes [provider]  lamoTRIgine (LAMICTAL) 25 MG tablet Take 25-50 mg by mouth 2 (two) times a day. Take 25 mg in the am and 50 mg at night. 02/12/19 03/13/20 Yes [provider]  loratadine (CLARITIN) 10 MG tablet Take 10 mg by mouth daily.   Yes [provider]  losartan (COZAAR) 100 MG tablet Take 100 mg by mouth daily. 10/02/18  Yes [provider]  pantoprazole (PROTONIX) 40 MG tablet Take 40 mg by mouth daily.   Yes [provider]  zolpidem (AMBIEN) 10 MG tablet Take 10 mg by mouth at bedtime.  10/13/18  Yes [provider]  etodolac (LODINE) 300 MG capsule Take 1 capsule (300 mg total) by mouth 3 (three) times daily as needed (pain). Patient not taking: Reported on 03/07/2019 01/16/19   Linwood DibblesKnapp, Jon, MD  omeprazole (PRILOSEC) 40 MG capsule Take 40 mg by mouth daily.    [provider]    Family History Family History  Problem Relation Age of Onset  . Heart failure Mother   . Diabetes Father   . Cancer Sister     Social History Social History   Tobacco Use  . Smoking status: Current Every Day Smoker    Packs/day: 1.00    Years: 1.50    Pack years: 1.50    Types: Cigarettes  .  Smokeless tobacco: Never Used  Substance Use Topics  . Alcohol use: Not Currently    Comment: no alcohol since 2002  . Drug use: No    Comment: electric cig     Allergies   Gabapentin, Ibuprofen, Tylenol [acetaminophen], Aspirin, Buprenorphine hcl, Codeine, Compazine, Ioxaglate, Ivp dye [iodinated diagnostic agents], Metrizamide, Naproxen, Norco [hydrocodone-acetaminophen], Penicillin g, Prochlorperazine maleate, Reglan [metoclopramide], Sulfur, Tegretol [carbamazepine], Toradol [ketorolac tromethamine], Tramadol, and Zofran [ondansetron hcl]   Review of Systems Review of Systems  All other systems reviewed and are negative.    Physical Exam Updated Vital Signs BP 133/84 (BP Location: Left Arm)   Pulse (!) 103   Temp 97.8 F (36.6 C) (Oral)   Resp 18   SpO2 97%   Physical Exam Vitals signs and  nursing note reviewed.  Constitutional:      Appearance: She is well-developed.  HENT:     Head: Normocephalic and atraumatic.     Right Ear: External ear normal.     Left Ear: External ear normal.  Eyes:     Conjunctiva/sclera: Conjunctivae normal.     Pupils: Pupils are equal, round, and reactive to light.  Neck:     Musculoskeletal: Normal range of motion and neck supple.     Trachea: Phonation normal.  Cardiovascular:     Rate and Rhythm: Normal rate and regular rhythm.     Heart sounds: Normal heart sounds.  Pulmonary:     Effort: Pulmonary effort is normal.     Breath sounds: Normal breath sounds.  Abdominal:     Palpations: Abdomen is soft.     Tenderness: There is no abdominal tenderness.  Musculoskeletal:     Comments: She guards against movement of the right shoulder, and is mildly tender of the right trapezius.  There is no deformity of the right shoulder.  Skin:    General: Skin is warm and dry.  Neurological:     Mental Status: She is alert and oriented to person, place, and time.     Cranial Nerves: No cranial nerve deficit.     Sensory: No sensory  deficit.     Motor: No abnormal muscle tone.     Coordination: Coordination normal.     Comments: No dysarthria or aphasia.  Psychiatric:        Mood and Affect: Mood normal.        Behavior: Behavior normal.        Thought Content: Thought content normal.        Judgment: Judgment normal.      ED Treatments / Results  Labs (all labs ordered are listed, but only abnormal results are displayed) Labs Reviewed - No data to display  EKG None  Radiology No results found.  Procedures Procedures (including critical care time)  Medications Ordered in ED Medications  loperamide (IMODIUM) capsule 4 mg (4 mg Oral Given 03/07/19 1559)  haloperidol (HALDOL) tablet 5 mg (5 mg Oral Given 03/07/19 1559)     Initial Impression / Assessment and Plan / ED Course  I have reviewed the triage vital signs and the nursing notes.  Pertinent labs & imaging results that were available during my care of the patient were reviewed by me and considered in my medical decision making (see chart for details).         Patient Vitals for the past 24 hrs:  BP Temp Temp src Pulse Resp SpO2  03/07/19 1658 133/84 - - (!) 103 18 97 %  03/07/19 1407 (!) 166/89 97.8 F (36.6 C) Oral (!) 107 20 100 %    6:08 PM Reevaluation with update and discussion. After initial assessment and treatment, an updated evaluation reveals on my arrival into the room patient is sleeping.  She aroused by voice.  She states that neither her pain or nausea are better.  She has not vomited.  At this time she states she is not going back to see the pain doctor mentioned above, because "he gave the steroid shots and they did not help."  Further, she states, "nothing helps my back pain."  Findings discussed with the patient and questions were answered. Mancel BaleElliott Dayan Desa   Medical Decision Making: Chronic pain, without acute findings.  Nonspecific symptoms of nausea and diarrhea treated in the ED with symptomatic care.  There is no  indication for further ED evaluation, treatment or hospitalization, at this time.  CRITICAL CARE-no Performed by: Daleen Bo  Nursing Notes Reviewed/ Care Coordinated Applicable Imaging Reviewed Interpretation of Laboratory Data incorporated into ED treatment  The patient appears reasonably screened and/or stabilized for discharge and I doubt any other medical condition or other [facility] requiring further screening, evaluation, or treatment in the ED at this time prior to discharge.  Plan: Home Medications-continue routine medication and use symptomatic over-the-counter medications; Home Treatments-gradual advance activity, heat to affected areas; return here if the recommended treatment, does not improve the symptoms; Recommended follow up-PCP, PRN   Final Clinical Impressions(s) / ED Diagnoses   Final diagnoses:  Chronic pain syndrome    ED Discharge Orders    None       Daleen Bo, MD 03/07/19 1811    Daleen Bo, MD 03/16/19 [phone]

## 2019-03-07 NOTE — Discharge Instructions (Addendum)
There is no sign no change in your chronic pain, so I recommend that you follow-up with your primary care doctor or a pain management doctor for further care and treatment.  For your nausea you can use the Phenergan which you have at home, and for pain, use Tylenol.

## 2019-03-07 NOTE — ED Triage Notes (Signed)
She c/o a (chronic) "bump" on here right shoulder. She also c/o abdominal discomfort. She states she was seen for same Fri. (two days ago) at [facility], "but they just gave me Ketamine and it didn't help". She also tells [place] that she went to [facility] in [place] Friday also "and they released me". She is ambulatory and in no distress.

## 2019-03-07 NOTE — ED Notes (Signed)
Pt refused D/C signature, stating "Just show me the way out!  He didn't help my stomach, my neck, or pain."  Pt then threw d/c papers in the trash.

## 2019-03-13 ENCOUNTER — Other Ambulatory Visit: Payer: Self-pay

## 2019-03-13 ENCOUNTER — Encounter (HOSPITAL_COMMUNITY): Payer: Self-pay | Admitting: *Deleted

## 2019-03-13 ENCOUNTER — Emergency Department (HOSPITAL_COMMUNITY)
Admission: EM | Admit: 2019-03-13 | Discharge: 2019-03-14 | Disposition: A | Discharge status: Home / Self Care | Payer: Medicaid Other | Attending: Emergency Medicine | Admitting: Emergency Medicine

## 2019-03-13 DIAGNOSIS — Z79899 Other long term (current) drug therapy: Secondary | ICD-10-CM | POA: Diagnosis not present

## 2019-03-13 DIAGNOSIS — I10 Essential (primary) hypertension: Secondary | ICD-10-CM | POA: Insufficient documentation

## 2019-03-13 DIAGNOSIS — R519 Headache, unspecified: Secondary | ICD-10-CM

## 2019-03-13 DIAGNOSIS — G894 Chronic pain syndrome: Secondary | ICD-10-CM | POA: Diagnosis not present

## 2019-03-13 DIAGNOSIS — E119 Type 2 diabetes mellitus without complications: Secondary | ICD-10-CM | POA: Diagnosis not present

## 2019-03-13 DIAGNOSIS — R51 Headache: Secondary | ICD-10-CM | POA: Insufficient documentation

## 2019-03-13 DIAGNOSIS — Z8673 Personal history of transient ischemic attack (TIA), and cerebral infarction without residual deficits: Secondary | ICD-10-CM | POA: Insufficient documentation

## 2019-03-13 DIAGNOSIS — F17210 Nicotine dependence, cigarettes, uncomplicated: Secondary | ICD-10-CM | POA: Insufficient documentation

## 2019-03-13 DIAGNOSIS — Z7902 Long term (current) use of antithrombotics/antiplatelets: Secondary | ICD-10-CM | POA: Insufficient documentation

## 2019-03-13 MED ORDER — MAGNESIUM SULFATE IN D5W 1-5 GM/100ML-% IV SOLN
1.0000 g | Freq: Once | INTRAVENOUS | Status: AC
  Administered 2019-03-13: 23:00:00 1 g via INTRAVENOUS
  Filled 2019-03-13: qty 100

## 2019-03-13 MED ORDER — DEXAMETHASONE SODIUM PHOSPHATE 10 MG/ML IJ SOLN
10.0000 mg | Freq: Once | INTRAMUSCULAR | Status: AC
  Administered 2019-03-13: 23:00:00 10 mg via INTRAVENOUS
  Filled 2019-03-13: qty 1

## 2019-03-13 MED ORDER — HALOPERIDOL LACTATE 5 MG/ML IJ SOLN
5.0000 mg | Freq: Once | INTRAMUSCULAR | Status: AC
  Administered 2019-03-13: 23:00:00 5 mg via INTRAVENOUS
  Filled 2019-03-13: qty 1

## 2019-03-13 MED ORDER — SODIUM CHLORIDE 0.9 % IV BOLUS
500.0000 mL | Freq: Once | INTRAVENOUS | Status: AC
  Administered 2019-03-13: 500 mL via INTRAVENOUS

## 2019-03-13 NOTE — ED Triage Notes (Signed)
Pt c/o migraine for the past week with NV and blurred vision. Also reported a fall, abrasion to L elbow and bilateral knees. Was seen at [facility] for migraine last night and told to take tylenol and asa, both of which pt is intolerant to

## 2019-03-13 NOTE — ED Provider Notes (Signed)
[facility] Provider Note   CSN: [id number] Arrival date & time: 03/13/19  2006    History   Chief Complaint Chief Complaint  Patient presents with   Migraine    HPI [name] is a 53 y.o. female with h/o hepatitic C, intracerebral hemorrhage, HTN, chronic headaches, chronic back pain, chronic right shoulder pain, DM, substance abuse, OSA on CPAP presents to the ER for evaluation of "migraine".  Onset yesterday morning.  He has gradually worsened.  Described as right-sided, squeezing, radiating to the right side of her neck, trapezius and shoulders.  Has history of migraines that feels similar.  Reports that a few years ago she had a brain bleed and associated right shoulder fracture that needed surgery, since then she has had right shoulder pain that shoots into her trapezius and neck occasionally causing in her growing migraines.  States yesterday she had a mechanical trip and fall and bumped the back of her head on a nearby coffee table.  She went to the ER where they did CT of the head and treated her migraine with ketamine, states that this did not help.  Her neurologist put her on Emgality shot every month but still has refractory 3-4 migraines a month.  Her neurologist recently prescribed something for her breakthrough headaches but she ran out and does not know the name.  Associated with nausea, vomiting and diarrhea.  Has not been eating a lot of food for the last 2 days but has drink a lot of water.  States after she fell yesterday she had about 20 minutes of her left vision going "white", felt like she had discharge in her eye and she could not see.  This has since resolved and has not returned.  Every now and then she has intermittent blurred vision that she attributes to her pain and migraines.  States that she cannot take ibuprofen because it causes skin itching, she cannot take Tylenol because it causes stomach burning.  She cannot take  tramadol, Compazine either because of itching and her "skin crawls".  She finished course of Plavix 2 months ago.  No fever.  She has right-sided neck and trapezius pain with neck movements but no frank rigidity or stiffness.  No double vision, loss of vision.  No difficulty swallowing.  No difficulty with speech.  No difficulty with balance.  She denies one-sided drooping or weakness to her extremities or face, numbness.    HPI  Past Medical History:  Diagnosis Date   Diabetes mellitus without complication (HCC)    Difficult intubation    Diverticulitis    Headache    Hepatitis C    treated and resolved   History of kidney stones    Hypertension    IBS (irritable bowel syndrome)    Kidney stones    Sciatica    Sleep apnea    on cpap   Stroke (HCC) 2017   memory loss    Patient Active Problem List   Diagnosis Date Noted   Hypotension 08/22/2018   Bradycardia 08/22/2018   Type 2 diabetes mellitus (HCC) 08/22/2018   Acute encephalopathy 08/21/2018   Shoulder pain, right 12/22/2017   UTI (urinary tract infection) 08/21/2017   Nausea & vomiting 08/21/2017   Nicotine dependence 08/21/2017   Headache 01/07/2017   Small bowel obstruction (HCC) 09/02/2016   Pain and swelling of left upper extremity 07/22/2016   Superficial venous thrombosis of arm, left 07/22/2016   ICH (intracerebral hemorrhage) (  HCC)    Essential hypertension, malignant 04/19/2016   Cytotoxic brain edema (HCC) 04/19/2016   IVH (intraventricular hemorrhage) (HCC) 04/18/2016   Hepatitis C     Past Surgical History:  Procedure Laterality Date   ABDOMINAL HYSTERECTOMY     carpel tunnel syndrome  2017   HARDWARE REMOVAL Right 12/22/2017   Procedure: HARDWARE REMOVAL-RIGHT ARM;  Surgeon: Lyndle HerrlichBowers, James R, MD;  Location: [facility] ORS;  Service: Orthopedics;  Laterality: Right;  Removal of implant, superficial right humerus   HUMERUS IM NAIL Right 03/11/2017   Procedure: INTRAMEDULLARY  (IM) NAIL HUMERAL;  Surgeon: Lyndle HerrlichBowers, James R, MD;  Location: [facility] ORS;  Service: Orthopedics;  Laterality: Right;   KIDNEY STONE SURGERY Right 02/12/12   KIDNEY STONE SURGERY Left 07/15/2012   LIGATION OF ARTERIOVENOUS  FISTULA Left 06/21/2016   Procedure: LIGATION OF ARTERIOVENOUS  FISTULA ( LIGATION BASILIC VEIN );  Surgeon: Renford DillsGregory G Schnier, MD;  Location: [facility] ORS;  Service: Vascular;  Laterality: Left;   LITHOTRIPSY     OOPHORECTOMY     SINUS EXPLORATION     TONSILLECTOMY       OB History   No obstetric history on file.      Home Medications    Prior to Admission medications   Medication Sig Start Date End Date Taking? Authorizing Provider  alprazolam Prudy Feeler(XANAX) 2 MG tablet Take 2 mg by mouth 2 (two) times daily as needed for anxiety. 10/13/18   [provider]  atorvastatin (LIPITOR) 40 MG tablet Take 40 mg by mouth daily. 12/10/18   [provider]  budesonide-formoterol (SYMBICORT) 160-4.5 MCG/ACT inhaler Inhale 2 puffs into the lungs 2 (two) times daily.    [provider]  butalbital-acetaminophen-caffeine (FIORICET) 50-325-40 MG tablet Take 1 tablet by mouth 2 (two) times daily as needed for headache.    [provider]  cloNIDine (CATAPRES) 0.2 MG tablet Take 0.2 mg by mouth 2 (two) times daily. 10/02/18   [provider]  clopidogrel (PLAVIX) 75 MG tablet Take 1 tablet (75 mg total) by mouth daily. 06/27/16   Marvel PlanXu, Jindong, MD  etodolac (LODINE) 300 MG capsule Take 1 capsule (300 mg total) by mouth 3 (three) times daily as needed (pain). Patient not taking: Reported on 03/07/2019 01/16/19   Linwood DibblesKnapp, Jon, MD  FLUoxetine (PROZAC) 40 MG capsule Take 40 mg by mouth daily.  06/28/18   [provider]  Galcanezumab-gnlm (EMGALITY) 120 MG/ML SOSY Inject 120 mg into the skin every 30 (thirty) days.    [provider]  glipiZIDE (GLUCOTROL) 5 MG tablet Take 1 tablet (5 mg total) by mouth 2 (two) times daily before a meal.  04/30/16   Lora PaulaKrall, Jennifer T, MD  hydrALAZINE (APRESOLINE) 50 MG tablet Take 50 mg by mouth 3 (three) times daily. 12/08/18   [provider]  hydrochlorothiazide (HYDRODIURIL) 50 MG tablet Take 50 mg by mouth 2 (two) times a day.    [provider]  lamoTRIgine (LAMICTAL) 25 MG tablet Take 25-50 mg by mouth 2 (two) times a day. Take 25 mg in the am and 50 mg at night. 02/12/19 03/13/20  [provider]  loratadine (CLARITIN) 10 MG tablet Take 10 mg by mouth daily.    [provider]  losartan (COZAAR) 100 MG tablet Take 100 mg by mouth daily. 10/02/18   [provider]  omeprazole (PRILOSEC) 40 MG capsule Take 40 mg by mouth daily.    [provider]  pantoprazole (PROTONIX) 40 MG tablet Take 40  mg by mouth daily.    [provider]  zolpidem (AMBIEN) 10 MG tablet Take 10 mg by mouth at bedtime.  10/13/18   [provider]    Family History Family History  Problem Relation Age of Onset   Heart failure Mother    Diabetes Father    Cancer Sister     Social History Social History   Tobacco Use   Smoking status: Current Every Day Smoker    Packs/day: 1.00    Years: 1.50    Pack years: 1.50    Types: Cigarettes   Smokeless tobacco: Never Used  Substance Use Topics   Alcohol use: Not Currently    Comment: no alcohol since 2002   Drug use: No    Comment: electric cig     Allergies   Gabapentin, Ibuprofen, Tylenol [acetaminophen], Aspirin, Buprenorphine hcl, Codeine, Compazine, Ioxaglate, Ivp dye [iodinated diagnostic agents], Metrizamide, Naproxen, Norco [hydrocodone-acetaminophen], Penicillin g, Prochlorperazine maleate, Reglan [metoclopramide], Sulfur, Tegretol [carbamazepine], Toradol [ketorolac tromethamine], Tramadol, and Zofran [ondansetron hcl]   Review of Systems Review of Systems  Gastrointestinal: Positive for diarrhea, nausea and vomiting.  Musculoskeletal: Positive for arthralgias, back pain,  myalgias and neck pain.  Neurological: Positive for headaches.  All other systems reviewed and are negative.    Physical Exam Updated Vital Signs BP (!) 142/85 (BP Location: Left Arm)    Pulse 72    Temp 98.5 F (36.9 C) (Oral)    Resp 16    SpO2 99%   Physical Exam Vitals signs and nursing note reviewed.  Constitutional:      General: She is not in acute distress.    Appearance: She is well-developed.     Comments: NAD.  HENT:     Head: Normocephalic and atraumatic.     Comments: No temporal artery tenderness     Right Ear: External ear normal.     Left Ear: External ear normal.     Nose: Nose normal.  Eyes:     General: No scleral icterus.    Conjunctiva/sclera: Conjunctivae normal.  Neck:     Musculoskeletal: Normal range of motion and neck supple. Muscular tenderness present.     Comments: TTP to right SCM, right sided cervical muscle, top of trapezius and posterior shoulder with light touch. No Midline tenderness. Pain noted with R neck bend and rotation but no rigidity, meningismus. No carotid bruits.  Cardiovascular:     Rate and Rhythm: Normal rate and regular rhythm.     Heart sounds: Normal heart sounds. No murmur.  Pulmonary:     Effort: Pulmonary effort is normal.     Breath sounds: Normal breath sounds. No wheezing.  Musculoskeletal: Normal range of motion.        General: No deformity.  Skin:    General: Skin is warm and dry.     Capillary Refill: Capillary refill takes less than 2 seconds.  Neurological:     Mental Status: She is alert and oriented to person, place, and time.     Comments:  Alert and oriented to self, place, time and event.  Speech is fluent without dysarthria or dysphasia. Strength 5/5 in upper/lower extremities. Sensation to light touch intact in face, upper/lower extremities. Sits on side of the bed without truncal sway No pronator drift. No leg drop. Normal finger-to-nose CN I not tested CN II grossly intact visual fields  bilaterally. Unable to visualize posterior eye. CN III, IV, VI PEERL and EOMs intact bilaterally CN V light touch  intact in all 3 divisions of trigeminal nerve CN VII facial movements symmetric CN VIII not tested CN IX, X no uvula deviation, symmetric rise of soft palate  CN XI 5/5 SCM and trapezius strength bilaterally  CN XII Midline tongue protrusion, symmetric L/R movements  Psychiatric:        Behavior: Behavior normal.        Thought Content: Thought content normal.        Judgment: Judgment normal.      ED Treatments / Results  Labs (all labs ordered are listed, but only abnormal results are displayed) Labs Reviewed - No data to display  EKG None  Radiology No results found.  Procedures Procedures (including critical care time)  Medications Ordered in ED Medications  dexamethasone (DECADRON) injection 10 mg (10 mg Intravenous Given 03/13/19 2235)  magnesium sulfate IVPB 1 g 100 mL (0 g Intravenous Stopped 03/13/19 2332)  haloperidol lactate (HALDOL) injection 5 mg (5 mg Intravenous Given 03/13/19 2235)  sodium chloride 0.9 % bolus 500 mL (0 mLs Intravenous Stopped 03/13/19 2333)     Initial Impression / Assessment and Plan / ED Course  I have reviewed the triage vital signs and the nursing notes.  Pertinent labs & imaging results that were available during my care of the patient were reviewed by me and considered in my medical decision making (see chart for details).  I reviewed patient's EMR to obtain pertinent PMH.  Patient presents with gradually worsening right-sided migraine with radiation to the right neck, right trapezius and shoulder.  Reports history of migraines that feels similar in the past.  She has chronic right shoulder, low back pain.  Chart review reveals patient has been to several different ERs since 6/23.  This is her eighth ER visit since then.  [facility] for what appears to be  chronic pain.  On exam she has reproducible right-sided neck, trapezius tenderness.  Neurological exam is normal.  No temporal tenderness.  No neck rigidity or meningismus.  No fever.  Reports headache began yesterday after mechanical fall with head trauma.  I [facility].  She had a head CT then that was normal.  Given symptoms, ddx includes tension type headache versus breakthrough migraine.  Could be posttraumatic headache.  She has no history of anticoagulation, meningismus, focal neuro deficits, pain over temporal arteries.  I doubt intracranial bleed, meningitis, CVA, dissection, temporal arteritis.  She has 20 documented allergies/intolerances.  She was given migraine cocktail here in the ER and upon reevaluation she was asleep.  Reported some improvement in her migraine.  Had lengthy discussion with patient regarding her frequent ER visits in the last week and a half.  Recommended follow-up with PCP and neurologist for ongoing pain management.  Return precautions discussed.  Patient is comfortable with plan.    Final Clinical Impressions(s) / ED Diagnoses   Final diagnoses:  Headache disorder  Chronic pain syndrome    ED Discharge Orders    None       Liberty HandyGibbons, Duc Crocket J, PA-C 03/14/19 0011    Virgina Norfolkuratolo, Adam, DO 03/14/19 0025

## 2019-03-14 NOTE — Discharge Instructions (Addendum)
Last ER visit yesterday and the head CT obtained then was normal.You are seen in the ER for migraine.  Your symptoms improved with medicines.  You have been to several ERs multiple times in the last week, I recommend that you touch base with your primary care doctor on Monday to discuss your ongoing pain.  You need to contact your neurologist for ongoing treatment of your migraines.  Return to the ER for fever greater than 100, vision loss or double vision, neck stiffness, stroke symptoms such as difficulty swallowing, with speech, one-sided weakness or numbness

## 2019-03-29 ENCOUNTER — Other Ambulatory Visit
Admission: RE | Admit: 2019-03-29 | Discharge: 2019-03-29 | Disposition: A | Discharge status: Home / Self Care | Payer: Medicaid Other | Source: Ambulatory Visit | Attending: Urology | Admitting: Urology

## 2019-03-29 DIAGNOSIS — N200 Calculus of kidney: Secondary | ICD-10-CM | POA: Insufficient documentation

## 2019-03-30 LAB — URINE CULTURE

## 2019-04-20 ENCOUNTER — Other Ambulatory Visit: Payer: Self-pay

## 2019-04-20 ENCOUNTER — Emergency Department (HOSPITAL_COMMUNITY)
Admission: EM | Admit: 2019-04-20 | Discharge: 2019-04-21 | Disposition: A | Discharge status: Home / Self Care | Payer: Medicaid Other | Attending: Emergency Medicine | Admitting: Emergency Medicine

## 2019-04-20 ENCOUNTER — Emergency Department (HOSPITAL_COMMUNITY): Payer: Medicaid Other

## 2019-04-20 ENCOUNTER — Encounter (HOSPITAL_COMMUNITY): Payer: Self-pay | Admitting: Family Medicine

## 2019-04-20 DIAGNOSIS — N390 Urinary tract infection, site not specified: Secondary | ICD-10-CM | POA: Insufficient documentation

## 2019-04-20 DIAGNOSIS — E119 Type 2 diabetes mellitus without complications: Secondary | ICD-10-CM | POA: Insufficient documentation

## 2019-04-20 DIAGNOSIS — R109 Unspecified abdominal pain: Secondary | ICD-10-CM | POA: Diagnosis not present

## 2019-04-20 DIAGNOSIS — Z7902 Long term (current) use of antithrombotics/antiplatelets: Secondary | ICD-10-CM | POA: Insufficient documentation

## 2019-04-20 DIAGNOSIS — Z79899 Other long term (current) drug therapy: Secondary | ICD-10-CM | POA: Insufficient documentation

## 2019-04-20 DIAGNOSIS — Z7984 Long term (current) use of oral hypoglycemic drugs: Secondary | ICD-10-CM | POA: Diagnosis not present

## 2019-04-20 DIAGNOSIS — F17210 Nicotine dependence, cigarettes, uncomplicated: Secondary | ICD-10-CM | POA: Diagnosis not present

## 2019-04-20 DIAGNOSIS — Z8673 Personal history of transient ischemic attack (TIA), and cerebral infarction without residual deficits: Secondary | ICD-10-CM | POA: Diagnosis not present

## 2019-04-20 DIAGNOSIS — M5442 Lumbago with sciatica, left side: Secondary | ICD-10-CM | POA: Insufficient documentation

## 2019-04-20 DIAGNOSIS — I10 Essential (primary) hypertension: Secondary | ICD-10-CM | POA: Diagnosis not present

## 2019-04-20 DIAGNOSIS — G8929 Other chronic pain: Secondary | ICD-10-CM | POA: Diagnosis not present

## 2019-04-20 DIAGNOSIS — M545 Low back pain: Secondary | ICD-10-CM | POA: Diagnosis present

## 2019-04-20 DIAGNOSIS — E669 Obesity, unspecified: Secondary | ICD-10-CM | POA: Diagnosis not present

## 2019-04-20 DIAGNOSIS — Z6833 Body mass index (BMI) 33.0-33.9, adult: Secondary | ICD-10-CM | POA: Insufficient documentation

## 2019-04-20 LAB — CBC WITH DIFFERENTIAL/PLATELET
Abs Immature Granulocytes: 0.02 10*3/uL (ref 0.00–0.07)
Basophils Absolute: 0.1 10*3/uL (ref 0.0–0.1)
Basophils Relative: 1 %
Eosinophils Absolute: 0.1 10*3/uL (ref 0.0–0.5)
Eosinophils Relative: 1 %
HCT: 40.2 % (ref 36.0–46.0)
Hemoglobin: 12.8 g/dL (ref 12.0–15.0)
Immature Granulocytes: 0 %
Lymphocytes Relative: 33 %
Lymphs Abs: 2.3 10*3/uL (ref 0.7–4.0)
MCH: 29.6 pg (ref 26.0–34.0)
MCHC: 31.8 g/dL (ref 30.0–36.0)
MCV: 92.8 fL (ref 80.0–100.0)
Monocytes Absolute: 0.7 10*3/uL (ref 0.1–1.0)
Monocytes Relative: 10 %
Neutro Abs: 3.9 10*3/uL (ref 1.7–7.7)
Neutrophils Relative %: 55 %
Platelets: 249 10*3/uL (ref 150–400)
RBC: 4.33 MIL/uL (ref 3.87–5.11)
RDW: 14.6 % (ref 11.5–15.5)
WBC: 7.1 10*3/uL (ref 4.0–10.5)
nRBC: 0 % (ref 0.0–0.2)

## 2019-04-20 MED ORDER — DIPHENHYDRAMINE HCL 25 MG PO CAPS
25.0000 mg | ORAL_CAPSULE | Freq: Once | ORAL | Status: AC
  Administered 2019-04-21: 02:00:00 25 mg via ORAL
  Filled 2019-04-20 (×2): qty 1

## 2019-04-20 MED ORDER — KETOROLAC TROMETHAMINE 30 MG/ML IJ SOLN
30.0000 mg | Freq: Once | INTRAMUSCULAR | Status: AC
  Administered 2019-04-21: 02:00:00 30 mg via INTRAMUSCULAR
  Filled 2019-04-20 (×2): qty 1

## 2019-04-20 NOTE — ED Triage Notes (Signed)
Patient states she is experiencing left sided mid back pain that radiates to her left hip and left leg. She states this pain started about 3-4 days. Also, today she states she fell while taking her dogs out around 11 am. She states she hit her head on a piece of wood with a few seconds of loss of consciousness. When inquiring about why she didn't seek medical treatment, she stated she could not have went to [facility] or she would have been in jail. Patient is alert, oriented x 4, and appears in no distress.

## 2019-04-20 NOTE — ED Provider Notes (Signed)
[facility]-EMERGENCY DEPT Provider Note   CSN: [id number] Arrival date & time: 04/20/19  1951    History   Chief Complaint Chief Complaint  Patient presents with   Back Pain   Fall    HPI Stephanie [name] is a 53 y.o. female.     HPI  This a 53 year old female with a history of diabetes, kidney stones, hypertension, chronic pain who presents with left-sided back pain.  Patient reports 3 to 4-day history of left-sided back pain that radiates into her left leg and hip.  She has had similar pain in the past.  She has not taken anything at home for the pain.  She rates her pain at 11 out of 10.  Reports recent lithotripsy on the right kidney.  Denies hematuria or dysuria.  Denies fevers.  She states that this morning she tripped and fell hitting her head on a piece of wood.  She reports loss of consciousness and continued dizziness since that time.  She describes room spinning dizziness.  She is on Plavix.  She denies any neck pain.  Past Medical History:  Diagnosis Date   Diabetes mellitus without complication (HCC)    Difficult intubation    Diverticulitis    Headache    Hepatitis C    treated and resolved   History of kidney stones    Hypertension    IBS (irritable bowel syndrome)    Kidney stones    Sciatica    Sleep apnea    on cpap   Stroke (HCC) 2017   memory loss    Patient Active Problem List   Diagnosis Date Noted   Hypotension 08/22/2018   Bradycardia 08/22/2018   Type 2 diabetes mellitus (HCC) 08/22/2018   Acute encephalopathy 08/21/2018   Shoulder pain, right 12/22/2017   UTI (urinary tract infection) 08/21/2017   Nausea & vomiting 08/21/2017   Nicotine dependence 08/21/2017   Headache 01/07/2017   Small bowel obstruction (HCC) 09/02/2016   Pain and swelling of left upper extremity 07/22/2016   Superficial venous thrombosis of arm, left 07/22/2016   ICH (intracerebral hemorrhage) (HCC)    Essential  hypertension, malignant 04/19/2016   Cytotoxic brain edema (HCC) 04/19/2016   IVH (intraventricular hemorrhage) (HCC) 04/18/2016   Hepatitis C     Past Surgical History:  Procedure Laterality Date   ABDOMINAL HYSTERECTOMY     carpel tunnel syndrome  2017   HARDWARE REMOVAL Right 12/22/2017   Procedure: HARDWARE REMOVAL-RIGHT ARM;  Surgeon: Lyndle HerrlichBowers, James R, MD;  Location: [facility] ORS;  Service: Orthopedics;  Laterality: Right;  Removal of implant, superficial right humerus   HUMERUS IM NAIL Right 03/11/2017   Procedure: INTRAMEDULLARY (IM) NAIL HUMERAL;  Surgeon: Lyndle HerrlichBowers, James R, MD;  Location: [facility] ORS;  Service: Orthopedics;  Laterality: Right;   KIDNEY STONE SURGERY Right 02/12/12   KIDNEY STONE SURGERY Left 07/15/2012   LIGATION OF ARTERIOVENOUS  FISTULA Left 06/21/2016   Procedure: LIGATION OF ARTERIOVENOUS  FISTULA ( LIGATION BASILIC VEIN );  Surgeon: Renford DillsGregory G Schnier, MD;  Location: [facility] ORS;  Service: Vascular;  Laterality: Left;   LITHOTRIPSY     OOPHORECTOMY     SINUS EXPLORATION     TONSILLECTOMY       OB History   No obstetric history on file.      Home Medications    Prior to Admission medications   Medication Sig Start Date End Date Taking? Authorizing Provider  alprazolam Prudy Feeler(XANAX) 2 MG tablet Take 2 mg  by mouth 2 (two) times daily as needed for anxiety. 10/13/18  Yes [provider]  atorvastatin (LIPITOR) 40 MG tablet Take 40 mg by mouth daily. 12/10/18  Yes [provider]  budesonide-formoterol (SYMBICORT) 160-4.5 MCG/ACT inhaler Inhale 2 puffs into the lungs 2 (two) times daily.   Yes [provider]  butalbital-acetaminophen-caffeine (FIORICET) 50-325-40 MG tablet Take 1 tablet by mouth 2 (two) times daily as needed for headache.   Yes [provider]  cloNIDine (CATAPRES) 0.2 MG tablet Take 0.2 mg by mouth 2 (two) times daily. 10/02/18  Yes [provider]  clopidogrel (PLAVIX) 75 MG tablet Take 1 tablet (75 mg  total) by mouth daily. 06/27/16  Yes Rosalin Hawking, MD  FLUoxetine (PROZAC) 40 MG capsule Take 40 mg by mouth daily.  06/28/18  Yes [provider]  Galcanezumab-gnlm (EMGALITY) 120 MG/ML SOSY Inject 120 mg into the skin every 30 (thirty) days.   Yes [provider]  glipiZIDE (GLUCOTROL) 5 MG tablet Take 1 tablet (5 mg total) by mouth 2 (two) times daily before a meal. 04/30/16  Yes Milagros Loll, MD  hydrALAZINE (APRESOLINE) 50 MG tablet Take 50 mg by mouth 3 (three) times daily. 12/08/18  Yes [provider]  hydrochlorothiazide (HYDRODIURIL) 50 MG tablet Take 50 mg by mouth 2 (two) times a day.   Yes [provider]  lamoTRIgine (LAMICTAL) 25 MG tablet Take 25-50 mg by mouth 2 (two) times a day. Take 25 mg in the am and 50 mg at night. 02/12/19 03/13/20 Yes [provider]  loratadine (CLARITIN) 10 MG tablet Take 10 mg by mouth daily.   Yes [provider]  losartan (COZAAR) 100 MG tablet Take 100 mg by mouth daily. 10/02/18  Yes [provider]  omeprazole (PRILOSEC) 40 MG capsule Take 40 mg by mouth daily.   Yes [provider]  pantoprazole (PROTONIX) 40 MG tablet Take 40 mg by mouth daily.   Yes [provider]  cephALEXin (KEFLEX) 500 MG capsule Take 1 capsule (500 mg total) by mouth 3 (three) times daily. 04/21/19   Derrious Bologna, Barbette Hair, MD  etodolac (LODINE) 300 MG capsule Take 1 capsule (300 mg total) by mouth 3 (three) times daily as needed (pain). Patient not taking: Reported on 03/07/2019 01/16/19   Dorie Rank, MD  methylPREDNISolone (MEDROL DOSEPAK) 4 MG TBPK tablet Take as directed on packet 04/21/19   Alisabeth Selkirk, Barbette Hair, MD  promethazine (PHENERGAN) 25 MG tablet Take 1 tablet (25 mg total) by mouth every 6 (six) hours as needed for nausea or vomiting. 04/21/19   Yancey Pedley, Barbette Hair, MD    Family History Family History  Problem Relation Age of Onset   Heart failure Mother    Diabetes Father    Cancer Sister      Social History Social History   Tobacco Use   Smoking status: Current Every Day Smoker    Packs/day: 1.00    Years: 1.50    Pack years: 1.50    Types: Cigarettes   Smokeless tobacco: Never Used  Substance Use Topics   Alcohol use: Not Currently    Comment: no alcohol since 2002   Drug use: No    Comment: electric cig     Allergies   Gabapentin, Ibuprofen, Tylenol [acetaminophen], Aspirin, Buprenorphine hcl, Codeine, Compazine, Ioxaglate, Ivp dye [iodinated diagnostic agents], Metrizamide, Naproxen, Norco [hydrocodone-acetaminophen], Penicillin g, Prochlorperazine maleate, Reglan [metoclopramide], Sulfur, Tegretol [carbamazepine], Toradol [ketorolac tromethamine], Tramadol, and Zofran [ondansetron hcl]  Review of Systems Review of Systems  Constitutional: Negative for fever.  Respiratory: Negative for shortness of breath.   Cardiovascular: Negative for chest pain.  Gastrointestinal: Positive for nausea. Negative for abdominal pain and vomiting.  Genitourinary: Negative for difficulty urinating, dysuria and hematuria.  Musculoskeletal: Positive for back pain.  Skin: Negative for color change.  Neurological: Positive for dizziness and headaches. Negative for weakness.  All other systems reviewed and are negative.    Physical Exam Updated Vital Signs BP 129/64 (BP Location: Right Arm)    Pulse 79    Temp 98.3 F (36.8 C) (Oral)    Resp 18    Ht 1.575 m (5\' 2" )    Wt 83.5 kg    SpO2 98%    BMI 33.65 kg/m   Physical Exam Vitals signs and nursing note reviewed.  Constitutional:      Appearance: She is well-developed. She is obese. She is not ill-appearing.  HENT:     Head: Normocephalic and atraumatic.     Mouth/Throat:     Mouth: Mucous membranes are moist.  Eyes:     Pupils: Pupils are equal, round, and reactive to light.  Neck:     Musculoskeletal: Neck supple.  Cardiovascular:     Rate and Rhythm: Normal rate and regular rhythm.     Heart sounds: Normal  heart sounds.  Pulmonary:     Effort: Pulmonary effort is normal. No respiratory distress.     Breath sounds: Normal breath sounds. No wheezing.  Abdominal:     General: Bowel sounds are normal.     Palpations: Abdomen is soft.     Tenderness: There is no abdominal tenderness.  Musculoskeletal:     Comments: Tenderness to palpation along the entire or with left thoracic and lumbar paraspinous muscle region, positive left straight leg raise,, no midline tenderness  Skin:    General: Skin is warm and dry.  Neurological:     Mental Status: She is alert and oriented to person, place, and time.     Comments: 5 out of 5 strength bilateral lower extremities, 2+ patellar reflexes bilaterally, normal gait, no dysmetria to finger-nose-finger  Psychiatric:        Mood and Affect: Mood normal.      ED Treatments / Results  Labs (all labs ordered are listed, but only abnormal results are displayed) Labs Reviewed  BASIC METABOLIC PANEL - Abnormal; Notable for the following components:      Result Value   Glucose, Bld 131 (*)    All other components within normal limits  URINALYSIS, ROUTINE W REFLEX MICROSCOPIC - Abnormal; Notable for the following components:   APPearance HAZY (*)    Glucose, UA >=500 (*)    Leukocytes,Ua MODERATE (*)    WBC, UA >50 (*)    Bacteria, UA RARE (*)    All other components within normal limits  URINE CULTURE  CBC WITH DIFFERENTIAL/PLATELET    EKG None  Radiology Ct Head Wo Contrast  Result Date: 04/21/2019 CLINICAL DATA:  Fall, history of CVA EXAM: CT HEAD WITHOUT CONTRAST TECHNIQUE: Contiguous axial images were obtained from the base of the skull through the vertex without intravenous contrast. COMPARISON:  03/12/2019 FINDINGS: Brain: No evidence of acute infarction, hemorrhage, hydrocephalus, extra-axial collection or mass lesion/mass effect. Encephalomalacic changes related to old left temporo-occipital infarct. Vascular: No hyperdense vessel or  unexpected calcification. Skull: Normal. Negative for fracture or focal lesion. Sinuses/Orbits: The visualized paranasal sinuses are essentially clear. The mastoid air cells  are unopacified. Other: None. IMPRESSION: No evidence of acute intracranial abnormality. 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 Electronically Signed   By: Charline BillsSriyesh  Krishnan M.D.   On: 04/21/2019 00:20   Ct Renal Stone Study  Result Date: 04/21/2019 CLINICAL DATA:  53 year old female with left back pain and flank pain. Concern for kidney stone. EXAM: CT ABDOMEN AND PELVIS WITHOUT CONTRAST TECHNIQUE: Multidetector CT imaging of the abdomen and pelvis was performed following the standard protocol without IV contrast. COMPARISON:  CT of the abdomen pelvis dated 02/06/2019 FINDINGS: Evaluation of this exam is limited in the absence of intravenous contrast. Lower chest: The visualized lung bases are clear. No intra-abdominal free air or free fluid. Hepatobiliary: No focal liver abnormality is seen. No gallstones, gallbladder wall thickening, or biliary dilatation. Pancreas: Unremarkable. No pancreatic ductal dilatation or surrounding inflammatory changes. Spleen: Normal in size without focal abnormality. Adrenals/Urinary Tract: The adrenal glands are unremarkable. Areas of parenchymal atrophy and cortical infarct primarily involving the inferior pole of the right kidney similar to prior studies dating back to 2018. There are multiple nonobstructing bilateral renal calculi measure up to 3-4 mm. There is no hydronephrosis on either side. The visualized ureters and urinary bladder appear unremarkable. Stomach/Bowel: There is sigmoid diverticulosis without active inflammatory changes. There is no bowel obstruction or active inflammation. The appendix is normal. Vascular/Lymphatic: Mild atherosclerotic calcification of the aorta. The IVC is unremarkable. No portal venous gas. There is no adenopathy. Reproductive: Hysterectomy. No pelvic mass. Other: None  Musculoskeletal: Degenerative changes primarily at L5-S1. No acute osseous pathology. IMPRESSION: 1. Small nonobstructing bilateral renal calculi. No hydronephrosis or obstructing stone. 2. Right renal inferior pole parenchyma atrophy and cortical scarring. 3. Sigmoid diverticulosis. No bowel obstruction or active inflammation. Normal appendix. 4. Aortic Atherosclerosis (ICD10-I70.0). Electronically Signed   By: Elgie CollardArash  Radparvar M.D.   On: 04/21/2019 01:22    Procedures Procedures (including critical care time)  Medications Ordered in ED Medications  ketorolac (TORADOL) 30 MG/ML injection 30 mg (30 mg Intramuscular Given 04/21/19 0141)  diphenhydrAMINE (BENADRYL) capsule 25 mg (25 mg Oral Given 04/21/19 0141)  cefTRIAXone (ROCEPHIN) injection 1 g (1 g Intramuscular Given 04/21/19 0135)  sterile water (preservative free) injection (2.1 mLs  Given 04/21/19 0135)     Initial Impression / Assessment and Plan / ED Course  I have reviewed the triage vital signs and the nursing notes.  Pertinent labs & imaging results that were available during my care of the patient were reviewed by me and considered in my medical decision making (see chart for details).        Patient presents with back pain.  She is overall nontoxic-appearing and vital signs are reassuring.  I suspect her pain is acute on chronic as she has a history of back pain.  She also has multiple allergies to pain medications.  She has reproducible tenderness on exam.  She does also have a history of kidney stones.  She is afebrile.  Straight leg raise is negative.  Otherwise no evidence of cauda equina.  Work-up initiated given dizziness following a fall.  CT scan does not show any evidence of intracranial abnormality.  Basic lab work-up is reassuring with the exception of urinalysis with greater than 50 white cells and bacteria.  Urine culture was sent.  Because of the urinalysis, I did obtain a CT scan to rule out infected stone.  CT scan  does not show any obstructing stone.  This is reassuring.  Patient was given Rocephin.  She will be  discharged on Keflex for UTI.  Additionally, will discharge with Decadron for sciatica.  After history, exam, and medical workup I feel the patient has been appropriately medically screened and is safe for discharge home. Pertinent diagnoses were discussed with the patient. Patient was given return precautions.   Final Clinical Impressions(s) / ED Diagnoses   Final diagnoses:  Lower urinary tract infectious disease  Chronic left-sided low back pain with left-sided sciatica    ED Discharge Orders         Ordered    cephALEXin (KEFLEX) 500 MG capsule  3 times daily     04/21/19 0205    promethazine (PHENERGAN) 25 MG tablet  Every 6 hours PRN     04/21/19 0205    methylPREDNISolone (MEDROL DOSEPAK) 4 MG TBPK tablet     04/21/19 0206           Shon Baton, MD 04/21/19 [phone]

## 2019-04-21 ENCOUNTER — Emergency Department (HOSPITAL_COMMUNITY): Payer: Medicaid Other

## 2019-04-21 ENCOUNTER — Encounter (HOSPITAL_COMMUNITY): Payer: Self-pay

## 2019-04-21 LAB — URINALYSIS, ROUTINE W REFLEX MICROSCOPIC
Bilirubin Urine: NEGATIVE
Glucose, UA: >=500 mg/dL — AB
Hgb urine dipstick: NEGATIVE
Ketones, ur: NEGATIVE mg/dL
Nitrite: NEGATIVE
Protein, ur: NEGATIVE mg/dL
Specific Gravity, Urine: 1.014 (ref 1.005–1.030)
WBC, UA: >50 WBC/hpf — ABNORMAL HIGH (ref 0–5)
pH: 6 (ref 5.0–8.0)

## 2019-04-21 LAB — BASIC METABOLIC PANEL
Anion gap: 9 (ref 5–15)
BUN: 10 mg/dL (ref 6–20)
CO2: 25 mmol/L (ref 22–32)
Calcium: 9.3 mg/dL (ref 8.9–10.3)
Chloride: 104 mmol/L (ref 98–111)
Creatinine, Ser: 0.84 mg/dL (ref 0.44–1.00)
GFR calc Af Amer: >60 mL/min (ref >60)
GFR calc non Af Amer: >60 mL/min (ref >60)
Glucose, Bld: 131 mg/dL — ABNORMAL HIGH (ref 70–99)
Potassium: 3.7 mmol/L (ref 3.5–5.1)
Sodium: 138 mmol/L (ref 135–145)

## 2019-04-21 MED ORDER — PROMETHAZINE HCL 25 MG PO TABS: 25.0000 mg | ORAL_TABLET | Freq: Four times a day (QID) | ORAL | 0 refills | Status: DC | PRN

## 2019-04-21 MED ORDER — CEPHALEXIN 500 MG PO CAPS: 500.0000 mg | ORAL_CAPSULE | Freq: Three times a day (TID) | ORAL | 0 refills | Status: DC

## 2019-04-21 MED ORDER — METHYLPREDNISOLONE 4 MG PO TBPK: ORAL_TABLET | ORAL | 0 refills | Status: DC

## 2019-04-21 MED ORDER — STERILE WATER FOR INJECTION IJ SOLN
INTRAMUSCULAR | Status: AC
  Administered 2019-04-21: 2.1 mL
  Filled 2019-04-21: qty 10

## 2019-04-21 MED ORDER — CEFTRIAXONE SODIUM 1 G IJ SOLR
1.0000 g | Freq: Once | INTRAMUSCULAR | Status: AC
  Administered 2019-04-21: 1 g via INTRAMUSCULAR
  Filled 2019-04-21: qty 10

## 2019-04-21 NOTE — Discharge Instructions (Addendum)
Your seen today for back pain.  You have evidence of UTI.  No evidence of pyelonephritis.  Take medications as prescribed.  Regarding her back pain, this is likely related to sciatica.  Take the Medrol Dosepak and follow-up closely with your primary physician.

## 2019-04-21 NOTE — ED Notes (Signed)
Patient able to ambulate unassisted with her cane

## 2019-04-22 LAB — URINE CULTURE: Culture: <10000 — AB

## 2019-05-10 ENCOUNTER — Other Ambulatory Visit: Payer: Self-pay

## 2019-05-10 ENCOUNTER — Emergency Department (HOSPITAL_COMMUNITY)
Admission: EM | Admit: 2019-05-10 | Discharge: 2019-05-11 | Disposition: A | Discharge status: Home / Self Care | Payer: Medicaid Other | Attending: Emergency Medicine | Admitting: Emergency Medicine

## 2019-05-10 ENCOUNTER — Encounter (HOSPITAL_COMMUNITY): Payer: Self-pay

## 2019-05-10 DIAGNOSIS — N390 Urinary tract infection, site not specified: Secondary | ICD-10-CM | POA: Diagnosis not present

## 2019-05-10 DIAGNOSIS — I10 Essential (primary) hypertension: Secondary | ICD-10-CM | POA: Insufficient documentation

## 2019-05-10 DIAGNOSIS — K047 Periapical abscess without sinus: Secondary | ICD-10-CM

## 2019-05-10 DIAGNOSIS — R112 Nausea with vomiting, unspecified: Secondary | ICD-10-CM | POA: Diagnosis not present

## 2019-05-10 DIAGNOSIS — F17210 Nicotine dependence, cigarettes, uncomplicated: Secondary | ICD-10-CM | POA: Insufficient documentation

## 2019-05-10 DIAGNOSIS — Z8673 Personal history of transient ischemic attack (TIA), and cerebral infarction without residual deficits: Secondary | ICD-10-CM | POA: Diagnosis not present

## 2019-05-10 DIAGNOSIS — R197 Diarrhea, unspecified: Secondary | ICD-10-CM

## 2019-05-10 DIAGNOSIS — E119 Type 2 diabetes mellitus without complications: Secondary | ICD-10-CM | POA: Diagnosis not present

## 2019-05-10 DIAGNOSIS — R51 Headache: Secondary | ICD-10-CM | POA: Diagnosis not present

## 2019-05-10 DIAGNOSIS — K0889 Other specified disorders of teeth and supporting structures: Secondary | ICD-10-CM | POA: Diagnosis present

## 2019-05-10 LAB — COMPREHENSIVE METABOLIC PANEL
ALT: 16 U/L (ref 0–44)
AST: 15 U/L (ref 15–41)
Albumin: 4 g/dL (ref 3.5–5.0)
Alkaline Phosphatase: 91 U/L (ref 38–126)
Anion gap: 10 (ref 5–15)
BUN: 7 mg/dL (ref 6–20)
CO2: 27 mmol/L (ref 22–32)
Calcium: 9.6 mg/dL (ref 8.9–10.3)
Chloride: 104 mmol/L (ref 98–111)
Creatinine, Ser: 0.69 mg/dL (ref 0.44–1.00)
GFR calc Af Amer: >60 mL/min (ref >60)
GFR calc non Af Amer: >60 mL/min (ref >60)
Glucose, Bld: 91 mg/dL (ref 70–99)
Potassium: 3.8 mmol/L (ref 3.5–5.1)
Sodium: 141 mmol/L (ref 135–145)
Total Bilirubin: 0.5 mg/dL (ref 0.3–1.2)
Total Protein: 7.6 g/dL (ref 6.5–8.1)

## 2019-05-10 LAB — CBG MONITORING, ED: Glucose-Capillary: 95 mg/dL (ref 70–99)

## 2019-05-10 LAB — CBC
HCT: 43 % (ref 36.0–46.0)
Hemoglobin: 13.5 g/dL (ref 12.0–15.0)
MCH: 29 pg (ref 26.0–34.0)
MCHC: 31.4 g/dL (ref 30.0–36.0)
MCV: 92.3 fL (ref 80.0–100.0)
Platelets: 194 10*3/uL (ref 150–400)
RBC: 4.66 MIL/uL (ref 3.87–5.11)
RDW: 14.9 % (ref 11.5–15.5)
WBC: 7.7 10*3/uL (ref 4.0–10.5)
nRBC: 0 % (ref 0.0–0.2)

## 2019-05-10 LAB — LIPASE, BLOOD: Lipase: 25 U/L (ref 11–51)

## 2019-05-10 LAB — I-STAT BETA HCG BLOOD, ED (MC, WL, AP ONLY): I-stat hCG, quantitative: <5 m[IU]/mL (ref <5)

## 2019-05-10 MED ORDER — SODIUM CHLORIDE 0.9 % IV BOLUS
500.0000 mL | Freq: Once | INTRAVENOUS | Status: AC
  Administered 2019-05-10: 500 mL via INTRAVENOUS

## 2019-05-10 MED ORDER — DROPERIDOL 2.5 MG/ML IJ SOLN
1.2500 mg | Freq: Once | INTRAMUSCULAR | Status: AC
  Administered 2019-05-10: 1.25 mg via INTRAVENOUS
  Filled 2019-05-10: qty 2

## 2019-05-10 MED ORDER — SODIUM CHLORIDE 0.9% FLUSH: 3.0000 mL | Freq: Once | INTRAVENOUS | Status: DC

## 2019-05-10 NOTE — ED Provider Notes (Signed)
Emergency Department Provider Note   I have reviewed the triage vital signs and the nursing notes.   HISTORY  Chief Complaint No chief complaint on file.   HPI Stephanie [name] is a 53 y.o. female with multiple medical problems documented below who is well-known to our department presents for recurrence of migraine headache, tooth pain, diarrhea and nausea.  Patient states that most of the symptoms started a few days ago and have been progressively worsening.  She tried Excedrin Migraine 1 time for symptoms and did not seem to help.  Review the records it appears that she has chronic migraines from a head bleed a year or 2 ago.  Patient has been on home medications in the past but for some unclear reason does not have them anymore.  She denies any fever, trauma or other red flags to this time.  She states that she has some back pain and thinks might be related to kidney stones however she was told the kidney stones were to be moderate kidneys.  No urinary symptoms.  Does have nonbloody diarrhea.  Once again has not tried anything for these at home.   No other associated or modifying symptoms.    Past Medical History:  Diagnosis Date  . Diabetes mellitus without complication (HCC)   . Difficult intubation   . Diverticulitis   . Headache   . Hepatitis C    treated and resolved  . History of kidney stones   . Hypertension   . IBS (irritable bowel syndrome)   . Kidney stones   . Sciatica   . Sleep apnea    on cpap  . Stroke [facility](HCC) 2017   memory loss    Patient Active Problem List   Diagnosis Date Noted  . Hypotension 08/22/2018  . Bradycardia 08/22/2018  . Type 2 diabetes mellitus (HCC) 08/22/2018  . Acute encephalopathy 08/21/2018  . Shoulder pain, right 12/22/2017  . UTI (urinary tract infection) 08/21/2017  . Nausea & vomiting 08/21/2017  . Nicotine dependence 08/21/2017  . Headache 01/07/2017  . Small bowel obstruction (HCC) 09/02/2016  . Pain and swelling of left  upper extremity 07/22/2016  . Superficial venous thrombosis of arm, left 07/22/2016  . ICH (intracerebral hemorrhage) (HCC)   . Essential hypertension, malignant 04/19/2016  . Cytotoxic brain edema (HCC) 04/19/2016  . IVH (intraventricular hemorrhage) (HCC) 04/18/2016  . Hepatitis C     Past Surgical History:  Procedure Laterality Date  . ABDOMINAL HYSTERECTOMY    . carpel tunnel syndrome  2017  . HARDWARE REMOVAL Right 12/22/2017   Procedure: HARDWARE REMOVAL-RIGHT ARM;  Surgeon: Lyndle HerrlichBowers, James R, MD;  Location: [facility] ORS;  Service: Orthopedics;  Laterality: Right;  Removal of implant, superficial right humerus  . HUMERUS IM NAIL Right 03/11/2017   Procedure: INTRAMEDULLARY (IM) NAIL HUMERAL;  Surgeon: Lyndle HerrlichBowers, James R, MD;  Location: [facility] ORS;  Service: Orthopedics;  Laterality: Right;  . KIDNEY STONE SURGERY Right 02/12/12  . KIDNEY STONE SURGERY Left 07/15/2012  . LIGATION OF ARTERIOVENOUS  FISTULA Left 06/21/2016   Procedure: LIGATION OF ARTERIOVENOUS  FISTULA ( LIGATION BASILIC VEIN );  Surgeon: Renford DillsGregory G Schnier, MD;  Location: [facility] ORS;  Service: Vascular;  Laterality: Left;  . LITHOTRIPSY    . OOPHORECTOMY    . SINUS EXPLORATION    . TONSILLECTOMY      Current Outpatient Rx  . Order #: [id number] Class: Historical Med  . Order #: [id number] Class: Historical Med  . Order #: [id number] Class: Historical Med  .  Order #: [id number] Class: Historical Med  . Order #: [id number] Class: Print  . Order #: [id number] Class: Normal  . Order #: [id number] Class: Historical Med  . Order #: [id number] Class: Normal  . Order #: [id number] Class: Normal  . Order #: [id number] Class: Historical Med  . Order #: [id number] Class: Historical Med  . Order #: [id number] Class: Normal  . Order #: [id number] Class: Historical Med  . Order #: [id number] Class: Historical Med  . Order #: [id number] Class: Historical Med  . Order #: [id number] Class: Historical Med  . Order #: [id number] Class: Historical Med  . Order #:  [id number] Class: Normal  . Order #: [id number] Class: Historical Med  . Order #: [id number] Class: Historical Med  . Order #: [id number] Class: Print    Allergies Gabapentin, Ibuprofen, Tylenol [acetaminophen], Aspirin, Buprenorphine hcl, Codeine, Compazine, Ioxaglate, Ivp dye [iodinated diagnostic agents], Metrizamide, Naproxen, Norco [hydrocodone-acetaminophen], Penicillin g, Prochlorperazine maleate, Reglan [metoclopramide], Sulfur, Tegretol [carbamazepine], Toradol [ketorolac tromethamine], Tramadol, and Zofran [ondansetron hcl]  Family History  Problem Relation Age of Onset  . Heart failure Mother   . Diabetes Father   . Cancer Sister     Social History Social History   Tobacco Use  . Smoking status: Current Every Day Smoker    Packs/day: 1.00    Years: 1.50    Pack years: 1.50    Types: Cigarettes  . Smokeless tobacco: Never Used  Substance Use Topics  . Alcohol use: Not Currently    Comment: no alcohol since 2002  . Drug use: No    Comment: electric cig    Review of Systems  All other systems negative except as documented in the HPI. All pertinent positives and negatives as reviewed in the HPI. ____________________________________________   PHYSICAL EXAM:  VITAL SIGNS: ED Triage Vitals  Enc Vitals Group     BP 05/10/19 2004 132/90     Pulse Rate 05/10/19 2004 89     Resp 05/10/19 2004 18     Temp 05/10/19 2004 98.6 F (37 C)     Temp Source 05/10/19 2004 Oral     SpO2 05/10/19 2004 98 %    Constitutional: Alert and oriented. Well appearing and in no acute distress. Eyes: Conjunctivae are normal. PERRL. EOMI. Head: Atraumatic. Nose: No congestion/rhinnorhea. Mouth/Throat: Mucous membranes are moist.  Oropharynx non-erythematous. Significant dental caries, broken teeth, missing teeth, mild gum erythema. Neck: No stridor.  No meningeal signs.   Cardiovascular: Normal rate, regular rhythm. Good peripheral circulation. Grossly normal heart sounds.   Respiratory:  Normal respiratory effort.  No retractions. Lungs CTAB. Gastrointestinal: Soft and nontender. No distention.  Musculoskeletal: No lower extremity tenderness nor edema. No gross deformities of extremities. Neurologic:  Normal speech and language. No gross focal neurologic deficits are appreciated.  Skin:  Skin is warm, dry and intact. No rash noted.   ____________________________________________   LABS (all labs ordered are listed, but only abnormal results are displayed)  Labs Reviewed  URINALYSIS, ROUTINE W REFLEX MICROSCOPIC - Abnormal; Notable for the following components:      Result Value   APPearance CLOUDY (*)    Leukocytes,Ua LARGE (*)    WBC, UA >50 (*)    Bacteria, UA FEW (*)    All other components within normal limits  LIPASE, BLOOD  COMPREHENSIVE METABOLIC PANEL  CBC  I-STAT BETA HCG BLOOD, ED ([facility], [facility], [facility] ONLY)  CBG MONITORING, ED   ____________________________________________    INITIAL IMPRESSION / ASSESSMENT AND PLAN / ED COURSE  Small amount of fluids, droperidol for headache. Would like a urine sample to  ensure no infection as she has a history of same but will be starting antibiotics for the tooth infection anyway, so not entirely necessary. Doubt meningitis, repeat SAH, CVA or other emergent cause for headache at this time.   Improved symptoms. Ultimately found to have a UTI, will start levaquin, dc clindamycin.   Pertinent labs & imaging results that were available during my care of the patient were reviewed by me and considered in my medical decision making (see chart for details).  A medical screening exam was performed and I feel the patient has had an appropriate workup for their chief complaint at this time and likelihood of emergent condition existing is low. They have been counseled on decision, discharge, follow up and which symptoms necessitate immediate return to the emergency department. They or their family verbally stated understanding and  agreement with plan and discharged in stable condition.   ____________________________________________  FINAL CLINICAL IMPRESSION(S) / ED DIAGNOSES  Final diagnoses:  Dental infection  Diarrhea, unspecified type  Nausea and vomiting, intractability of vomiting not specified, unspecified vomiting type     MEDICATIONS GIVEN DURING THIS VISIT:  Medications  sodium chloride flush (NS) 0.9 % injection 3 mL (has no administration in time range)  sodium chloride 0.9 % bolus 500 mL (0 mLs Intravenous Stopped 05/11/19 0209)  droperidol (INAPSINE) 2.5 MG/ML injection 1.25 mg (1.25 mg Intravenous Given 05/10/19 2330)     NEW OUTPATIENT MEDICATIONS STARTED DURING THIS VISIT:  Discharge Medication List as of 05/11/2019  2:08 AM    START taking these medications   Details  clindamycin (CLEOCIN) 300 MG capsule Take 1 capsule (300 mg total) by mouth 4 (four) times daily. X 7 days, Starting Tue 05/11/2019, Normal        Note:  This note was prepared with assistance of Dragon voice recognition software. Occasional wrong-word or sound-a-like substitutions may have occurred due to the inherent limitations of voice recognition software.   Kyani Simkin, Corene Cornea, MD 05/11/19 [phone]

## 2019-05-10 NOTE — ED Triage Notes (Signed)
Pt complains of dental pain that includes 4 teeth, also states she has a headache and has been vomiting and having diarrhea for 4 days

## 2019-05-11 LAB — URINALYSIS, ROUTINE W REFLEX MICROSCOPIC
Bilirubin Urine: NEGATIVE
Glucose, UA: NEGATIVE mg/dL
Hgb urine dipstick: NEGATIVE
Ketones, ur: NEGATIVE mg/dL
Nitrite: NEGATIVE
Protein, ur: NEGATIVE mg/dL
Specific Gravity, Urine: 1.011 (ref 1.005–1.030)
WBC, UA: >50 WBC/hpf — ABNORMAL HIGH (ref 0–5)
pH: 6 (ref 5.0–8.0)

## 2019-05-11 MED ORDER — LEVOFLOXACIN 500 MG PO TABS: 500.0000 mg | ORAL_TABLET | Freq: Every day | ORAL | 0 refills | Status: AC

## 2019-05-11 MED ORDER — CLINDAMYCIN HCL 300 MG PO CAPS: 300.0000 mg | ORAL_CAPSULE | Freq: Four times a day (QID) | ORAL | 0 refills | Status: DC

## 2019-05-11 NOTE — ED Notes (Signed)
Pt's IV came out when she went to [facility]. RN aware.

## 2019-05-17 ENCOUNTER — Other Ambulatory Visit: Payer: Self-pay

## 2019-05-17 ENCOUNTER — Encounter (HOSPITAL_COMMUNITY): Payer: Self-pay

## 2019-05-17 ENCOUNTER — Emergency Department (HOSPITAL_COMMUNITY): Payer: Medicaid Other

## 2019-05-17 ENCOUNTER — Emergency Department (HOSPITAL_COMMUNITY)
Admission: EM | Admit: 2019-05-17 | Discharge: 2019-05-17 | Disposition: A | Discharge status: Home / Self Care | Payer: Medicaid Other | Attending: Emergency Medicine | Admitting: Emergency Medicine

## 2019-05-17 DIAGNOSIS — G43909 Migraine, unspecified, not intractable, without status migrainosus: Secondary | ICD-10-CM | POA: Diagnosis not present

## 2019-05-17 DIAGNOSIS — I10 Essential (primary) hypertension: Secondary | ICD-10-CM | POA: Insufficient documentation

## 2019-05-17 DIAGNOSIS — F17210 Nicotine dependence, cigarettes, uncomplicated: Secondary | ICD-10-CM | POA: Diagnosis not present

## 2019-05-17 DIAGNOSIS — W19XXXA Unspecified fall, initial encounter: Secondary | ICD-10-CM

## 2019-05-17 DIAGNOSIS — Z79899 Other long term (current) drug therapy: Secondary | ICD-10-CM | POA: Insufficient documentation

## 2019-05-17 DIAGNOSIS — Z7984 Long term (current) use of oral hypoglycemic drugs: Secondary | ICD-10-CM | POA: Diagnosis not present

## 2019-05-17 DIAGNOSIS — G43809 Other migraine, not intractable, without status migrainosus: Secondary | ICD-10-CM

## 2019-05-17 DIAGNOSIS — E119 Type 2 diabetes mellitus without complications: Secondary | ICD-10-CM | POA: Diagnosis not present

## 2019-05-17 DIAGNOSIS — R51 Headache: Secondary | ICD-10-CM | POA: Diagnosis present

## 2019-05-17 MED ORDER — DROPERIDOL 2.5 MG/ML IJ SOLN
1.2500 mg | Freq: Once | INTRAMUSCULAR | Status: AC
  Administered 2019-05-17: 1.25 mg via INTRAMUSCULAR
  Filled 2019-05-17: qty 2

## 2019-05-17 NOTE — Discharge Instructions (Signed)
Please use your walker at home for balance and support.  Be careful climbing or walking down stairs.  Please call your primary care doctor to discuss your issues with falling in the house.  You should avoid alcohol or any medications (like ambien, opioids, percocet, ativan, or xanax) that can make you drowsy.

## 2019-05-17 NOTE — ED Provider Notes (Signed)
[facility]-EMERGENCY DEPT Provider Note   CSN: [id number] Arrival date & time: 05/17/19  1336     History   Chief Complaint Chief Complaint  Patient presents with  . Fall    HPI Stephanie [name] is a 53 y.o. female with a history of stroke, sleep apnea, chronic migraines, hypertension, presented to the emergency department with fall and headache.  She reports she was getting out of bed today and tripped over the dog house in her bedroom.  She states she struck the right side of her head on a dresser.  She did not lose consciousness.  She reports she has had a headache all day, which began prior to her fall, but worsened after the fall.  She reports the headache is similar to her migraines in the past.  She reports sensitivity to light, nausea but no vomiting.  She reports she was recently taken off Plavix about a week ago due to an issue with recurrent falls.  She is unsure why she keeps falling.  She states she recently got a new glasses prescription, and "sometimes I see funny." She denies lightheadedness chest pain or palpitations prior to her falls.  She denies any new neurological deficits in her legs or recent spinal trauma.  She reports she typically walks with a walker.  She has a very extensive list of allergies which are noted above.  She reports she is "not allowed to take" NSAIDs or Tylenol.  She says the only thing that works for her headache is morphine.  She denies taking opioids or sedatives at home today.    HPI  Past Medical History:  Diagnosis Date  . Diabetes mellitus without complication (HCC)   . Difficult intubation   . Diverticulitis   . Headache   . Hepatitis C    treated and resolved  . History of kidney stones   . Hypertension   . IBS (irritable bowel syndrome)   . Kidney stones   . Sciatica   . Sleep apnea    on cpap  . Stroke [facility]) 2017   memory loss    Patient Active Problem List   Diagnosis Date Noted  . Hypotension  08/22/2018  . Bradycardia 08/22/2018  . Type 2 diabetes mellitus (HCC) 08/22/2018  . Acute encephalopathy 08/21/2018  . Shoulder pain, right 12/22/2017  . UTI (urinary tract infection) 08/21/2017  . Nausea & vomiting 08/21/2017  . Nicotine dependence 08/21/2017  . Headache 01/07/2017  . Small bowel obstruction (HCC) 09/02/2016  . Pain and swelling of left upper extremity 07/22/2016  . Superficial venous thrombosis of arm, left 07/22/2016  . ICH (intracerebral hemorrhage) (HCC)   . Essential hypertension, malignant 04/19/2016  . Cytotoxic brain edema (HCC) 04/19/2016  . IVH (intraventricular hemorrhage) (HCC) 04/18/2016  . Hepatitis C     Past Surgical History:  Procedure Laterality Date  . ABDOMINAL HYSTERECTOMY    . carpel tunnel syndrome  2017  . HARDWARE REMOVAL Right 12/22/2017   Procedure: HARDWARE REMOVAL-RIGHT ARM;  Surgeon: Lyndle Herrlich, MD;  Location: [facility] ORS;  Service: Orthopedics;  Laterality: Right;  Removal of implant, superficial right humerus  . HUMERUS IM NAIL Right 03/11/2017   Procedure: INTRAMEDULLARY (IM) NAIL HUMERAL;  Surgeon: Lyndle Herrlich, MD;  Location: [facility] ORS;  Service: Orthopedics;  Laterality: Right;  . KIDNEY STONE SURGERY Right 02/12/12  . KIDNEY STONE SURGERY Left 07/15/2012  . LIGATION OF ARTERIOVENOUS  FISTULA Left 06/21/2016   Procedure: LIGATION OF ARTERIOVENOUS  FISTULA ( LIGATION BASILIC VEIN );  Surgeon: Renford DillsGregory G Schnier, MD;  Location: [facility] ORS;  Service: Vascular;  Laterality: Left;  . LITHOTRIPSY    . OOPHORECTOMY    . SINUS EXPLORATION    . TONSILLECTOMY       OB History   No obstetric history on file.      Home Medications    Prior to Admission medications   Medication Sig Start Date End Date Taking? Authorizing Provider  budesonide-formoterol (SYMBICORT) 160-4.5 MCG/ACT inhaler Inhale 2 puffs into the lungs 2 (two) times daily.   Yes [provider]  ciprofloxacin (CIPRO) 500 MG tablet Take 500 mg by mouth 2  (two) times daily.   Yes [provider]  cloNIDine (CATAPRES) 0.2 MG tablet Take 0.2 mg by mouth 2 (two) times daily. 10/02/18  Yes [provider]  FLUoxetine (PROZAC) 40 MG capsule Take 40 mg by mouth daily.  06/28/18  Yes [provider]  glipiZIDE (GLUCOTROL) 5 MG tablet Take 1 tablet (5 mg total) by mouth 2 (two) times daily before a meal. 04/30/16  Yes Lora PaulaKrall, Jennifer T, MD  hydrALAZINE (APRESOLINE) 50 MG tablet Take 50 mg by mouth 3 (three) times daily. 12/08/18  Yes [provider]  lamoTRIgine (LAMICTAL) 25 MG tablet Take 25-50 mg by mouth 2 (two) times a day. Take 25 mg in the am and 50 mg at night. 02/12/19 03/13/20 Yes [provider]  loratadine (CLARITIN) 10 MG tablet Take 10 mg by mouth daily.   Yes [provider]  losartan (COZAAR) 100 MG tablet Take 100 mg by mouth daily. 10/02/18  Yes [provider]  pantoprazole (PROTONIX) 40 MG tablet Take 40 mg by mouth daily.   Yes [provider]  promethazine (PHENERGAN) 25 MG tablet Take 1 tablet (25 mg total) by mouth every 6 (six) hours as needed for nausea or vomiting. 04/21/19  Yes Horton, Mayer Maskerourtney F, MD  traZODone (DESYREL) 100 MG tablet Take 100 mg by mouth at bedtime.   Yes [provider]  cephALEXin (KEFLEX) 500 MG capsule Take 1 capsule (500 mg total) by mouth 3 (three) times daily. Patient not taking: Reported on 05/17/2019 04/21/19   Horton, Mayer Maskerourtney F, MD  clopidogrel (PLAVIX) 75 MG tablet Take 1 tablet (75 mg total) by mouth daily. Patient not taking: Reported on 05/17/2019 06/27/16   Marvel PlanXu, Jindong, MD  etodolac (LODINE) 300 MG capsule Take 1 capsule (300 mg total) by mouth 3 (three) times daily as needed (pain). Patient not taking: Reported on 03/07/2019 01/16/19   Linwood DibblesKnapp, Jon, MD  levofloxacin (LEVAQUIN) 500 MG tablet Take 1 tablet (500 mg total) by mouth daily for 7 days. Patient not taking: Reported on 05/17/2019 05/11/19 05/18/19  Mesner, Barbara CowerJason, MD   methylPREDNISolone (MEDROL DOSEPAK) 4 MG TBPK tablet Take as directed on packet Patient not taking: Reported on 05/17/2019 04/21/19   Horton, Mayer Maskerourtney F, MD    Family History Family History  Problem Relation Age of Onset  . Heart failure Mother   . Diabetes Father   . Cancer Sister     Social History Social History   Tobacco Use  . Smoking status: Current Every Day Smoker    Packs/day: 1.00    Years: 1.50    Pack years: 1.50    Types: Cigarettes  . Smokeless tobacco: Never Used  Substance Use Topics  . Alcohol use: Not Currently    Comment: no alcohol since 2002  . Drug use: No  Comment: electric cig     Allergies   Gabapentin, Ibuprofen, Tylenol [acetaminophen], Aspirin, Buprenorphine hcl, Codeine, Compazine, Ioxaglate, Ivp dye [iodinated diagnostic agents], Metrizamide, Naproxen, Norco [hydrocodone-acetaminophen], Penicillin g, Prochlorperazine maleate, Reglan [metoclopramide], Sulfur, Tegretol [carbamazepine], Toradol [ketorolac tromethamine], Tramadol, and Zofran [ondansetron hcl]   Review of Systems Review of Systems  Constitutional: Negative for chills and fever.  HENT: Negative for ear pain and sore throat.   Eyes: Negative for photophobia, pain and discharge.  Respiratory: Negative for cough and shortness of breath.   Cardiovascular: Negative for chest pain and palpitations.  Gastrointestinal: Negative for nausea and vomiting.  Musculoskeletal: Positive for arthralgias and back pain. Negative for neck pain and neck stiffness.  Skin: Negative for pallor and rash.  Neurological: Positive for light-headedness and headaches. Negative for seizures and syncope.  All other systems reviewed and are negative.    Physical Exam Updated Vital Signs BP (!) 141/94 (BP Location: Left Arm)   Pulse 95   Temp 97.8 F (36.6 C)   Resp 18   Wt 84 kg   SpO2 100%   BMI 33.87 kg/m   Physical Exam Vitals signs and nursing note reviewed.  Constitutional:      General:  She is not in acute distress.    Appearance: She is well-developed.     Comments: Appears groggy, but awakens and answers questions easily  HENT:     Head: Normocephalic and atraumatic. No raccoon eyes, Battle's sign, abrasion, contusion, masses or laceration.     Jaw: There is normal jaw occlusion. No trismus or tenderness.     Right Ear: Tympanic membrane normal.     Left Ear: Tympanic membrane normal.  Eyes:     Conjunctiva/sclera: Conjunctivae normal.  Neck:     Musculoskeletal: Neck supple.  Cardiovascular:     Rate and Rhythm: Normal rate and regular rhythm.     Pulses: Normal pulses.  Pulmonary:     Effort: Pulmonary effort is normal. No respiratory distress.     Breath sounds: Normal breath sounds.  Abdominal:     Palpations: Abdomen is soft.     Tenderness: There is no abdominal tenderness.  Skin:    General: Skin is warm and dry.     Comments: Circular region of bruising in mid lower back without focal rib tenderness or spinal midline tenderness  Neurological:     Mental Status: She is oriented to person, place, and time. Mental status is at baseline.     Sensory: No sensory deficit.     Motor: No weakness.      ED Treatments / Results  Labs (all labs ordered are listed, but only abnormal results are displayed) Labs Reviewed - No data to display  EKG None  Radiology Ct Head Wo Contrast  Result Date: 05/17/2019 CLINICAL DATA:  Headache EXAM: CT HEAD WITHOUT CONTRAST TECHNIQUE: Contiguous axial images were obtained from the base of the skull through the vertex without intravenous contrast. COMPARISON:  04/20/2019 FINDINGS: Brain: No evidence of acute infarction, hemorrhage, hydrocephalus, extra-axial collection or mass lesion/mass effect. Stable encephalomalacia related to prior left temporo-occipital infarct. Vascular: Mild atherosclerotic calcifications involving the large vessels of the skull base. No unexpected hyperdense vessel. Skull: Normal. Negative for  fracture or focal lesion. Sinuses/Orbits: Scattered ethmoid sinus mucosal thickening, unchanged. Remaining paranasal sinuses and mastoid air cells are clear. Orbital structures intact. Other: None. IMPRESSION: 1. No acute intracranial findings. 2. Chronic left temporo-occipital infarct. Electronically Signed   By: Marisue Brooklyn.D.  On: 05/17/2019 15:28    Procedures Procedures (including critical care time)  Medications Ordered in ED Medications  droperidol (INAPSINE) 2.5 MG/ML injection 1.25 mg (1.25 mg Intramuscular Given 05/17/19 1438)     Initial Impression / Assessment and Plan / ED Course  I have reviewed the triage vital signs and the nursing notes.  Pertinent labs & imaging results that were available during my care of the patient were reviewed by me and considered in my medical decision making (see chart for details).  53 yo female w/ hx of CVA presenting to the ED with mechanical fall and head trauma.  She reports migraine headache that began this morning but worsened after the fall.  She otherwise appears somewhat drowsy on exam, although she is not confused.  She states that she is "always like this when I get a headache."  It is possible this is related to her chronic migraines.  We will give droperidol for headache as she has allergies to all other medications.  There is no evidence of head trauma on her exam.  However given her hx of brain bleed and drowsiness, we will obtain CTH to rule out possible bleed.  She has no spinal ttp to warrant spine imaging at this time.  She has a small area of bruising on her back from another fall earlier this week.  It is unclear what is causing these recurrent falls.  Does not appear to be cardiac as she has no prodrome and has no LOC.  Likewise unlikely to be seizures.  She uses a walker at baseline and I advised her to continue doing so and take extra precautions at home.  Clinical Course as of May 17 1903  Mon May 17, 2019  1535  IMPRESSION: 1. No acute intracranial findings. 2. Chronic left temporo-occipital infarct.   [MT]  1643 Patient reports improvement with droperidol, states that her headache is better.  We will ambulate her and discharge her.  I advised her to use her walker at home given her issues with frequent falls, and to discuss this with her primary care doctor.  She reports that her husband is waiting to take her home.   [MT]    Clinical Course User Index [MT] Terald Sleeperrifan, Teaghan Formica J, MD       Final Clinical Impressions(s) / ED Diagnoses   Final diagnoses:  Fall, initial encounter  Other migraine without status migrainosus, not intractable    ED Discharge Orders    None       Hubbard Seldon, Kermit BaloMatthew J, MD 05/17/19 1904

## 2019-05-17 NOTE — ED Notes (Signed)
Pt returned from radiology.

## 2019-05-17 NOTE — ED Triage Notes (Signed)
Pt states that she fell, tripping over her dog. Pt states she hit her head, shoulder, side, back, legs. Pt c/o pain in her right leg as well.

## 2019-05-19 IMAGING — CR DG HUMERUS 2V *R*
2 series · 2 of 2 positions shown · non-contrast
Comparison: Right shoulder dated 01/18/2017 and 03/11/2017 and
today.

CLINICAL DATA: Worsening right upper arm pain for the past month
since surgical repair of a proximal humerus fracture.

EXAM:
RIGHT HUMERUS - 2+ VIEW

[humerus ap]
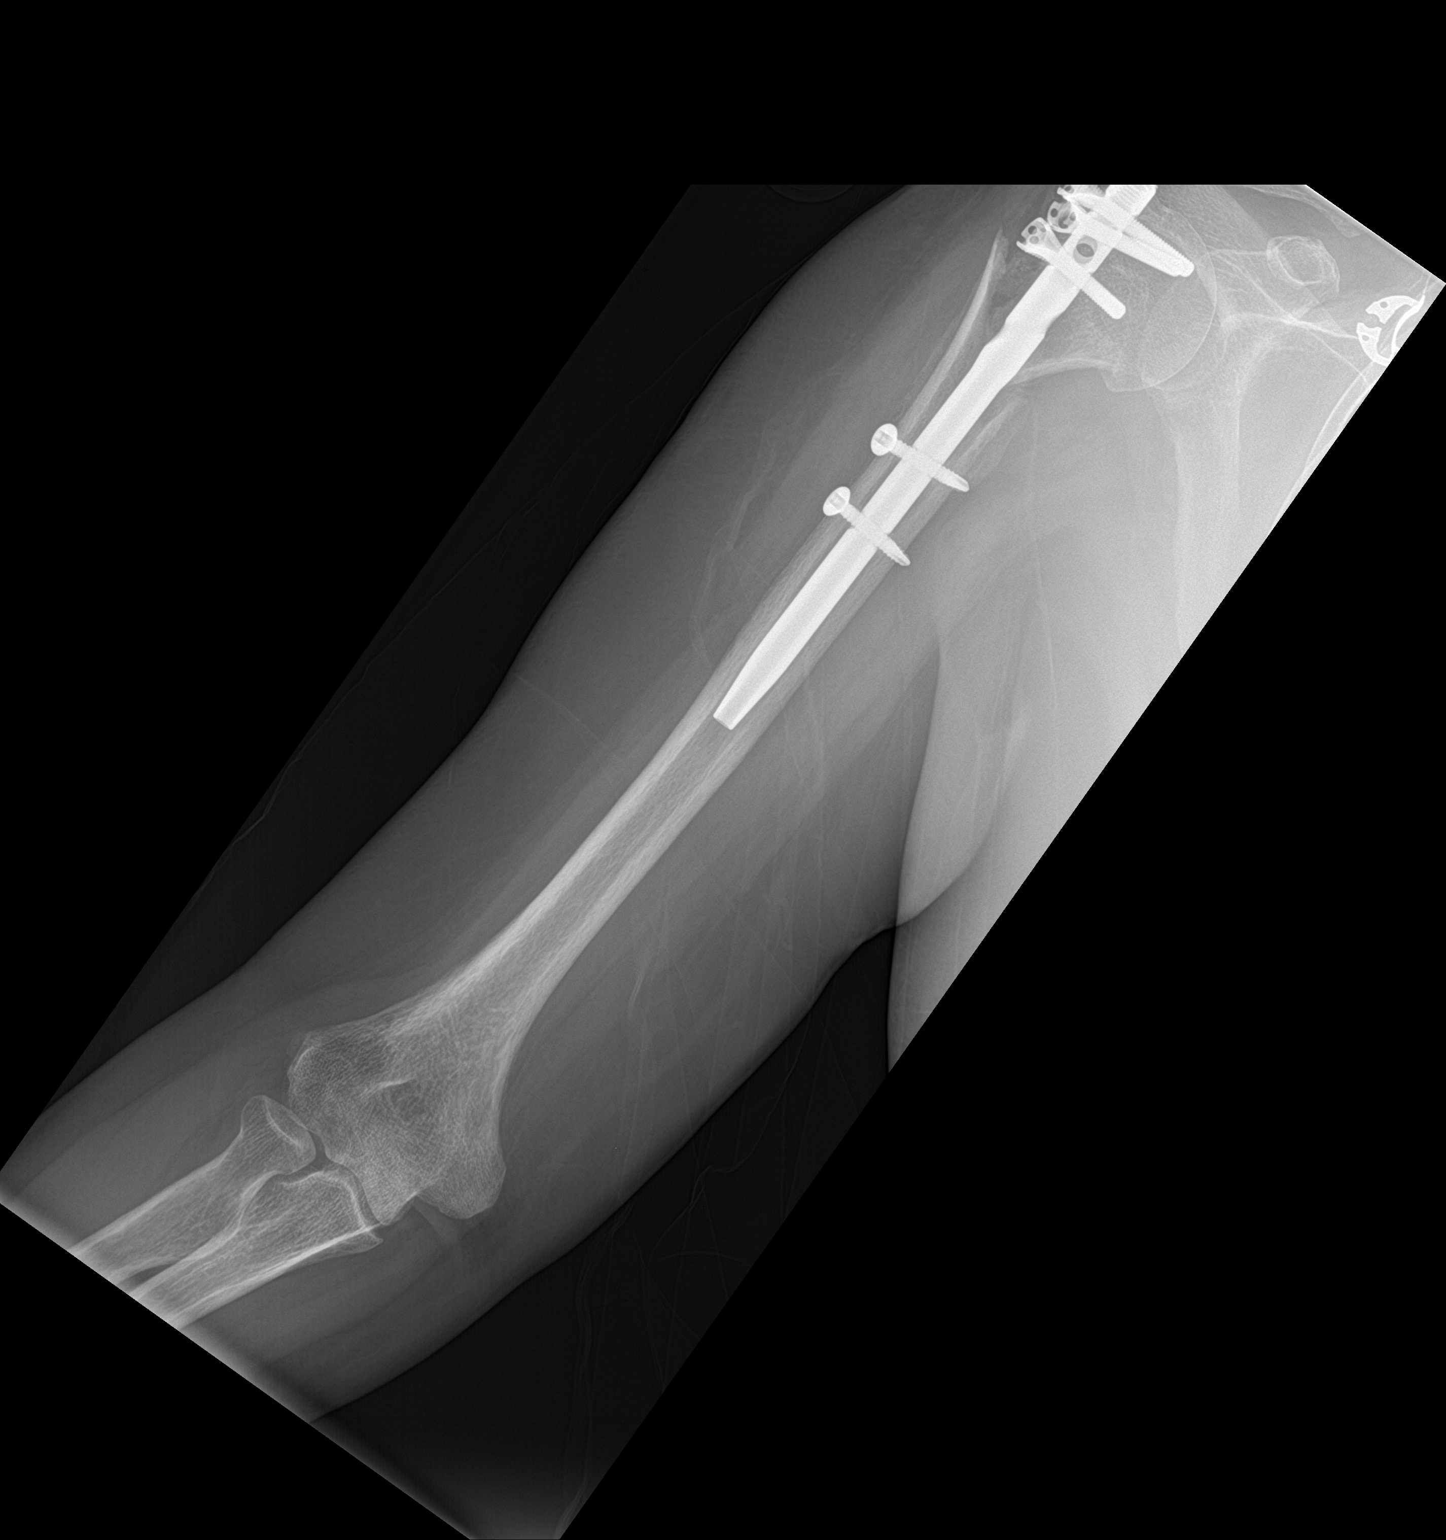

[humerus lat]
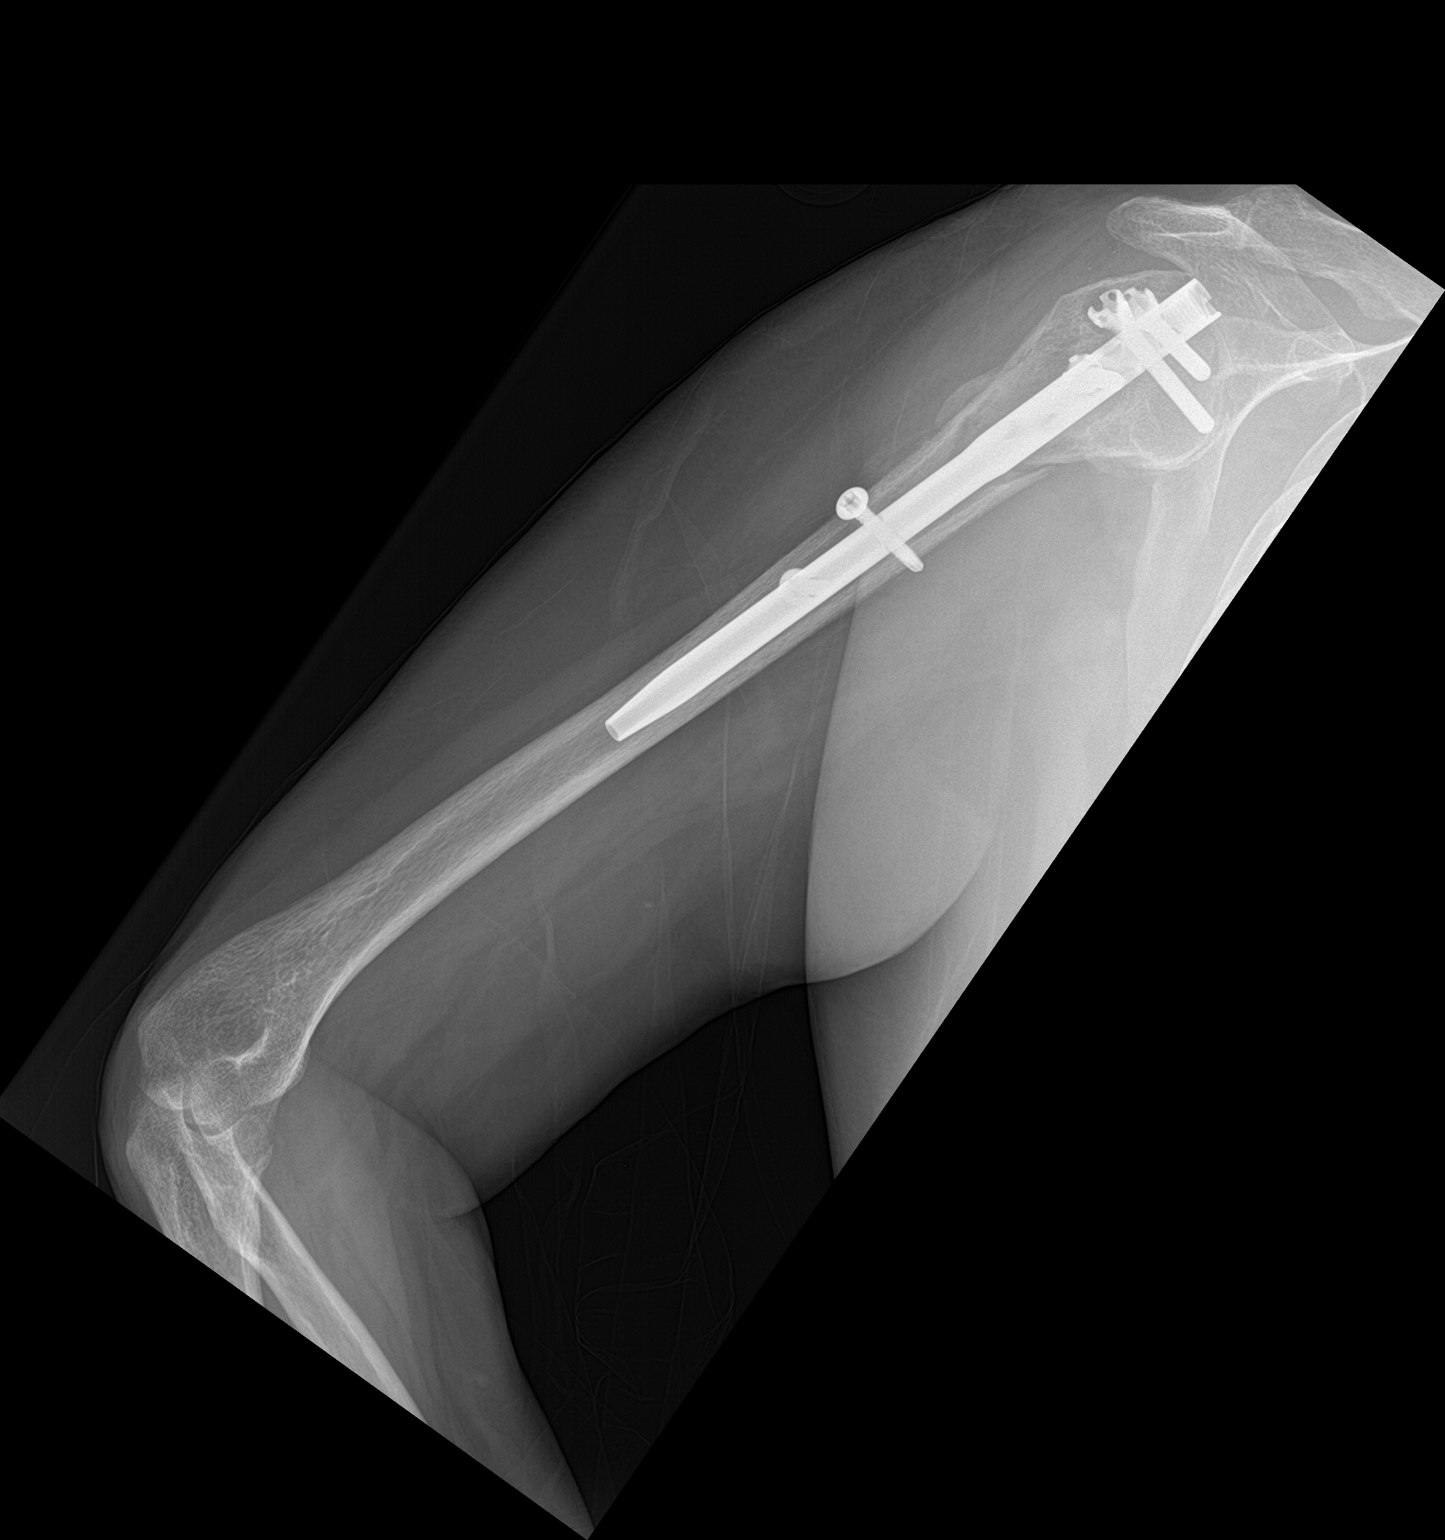

[2 of 2 positions shown; findings below may reference images not displayed]

FINDINGS: Intramedullary rod and screw fixation of the previously demonstrated
comminuted proximal humerus fracture. There is callus formation
distal to the fracture site with no bridging callus. No acute
fracture or dislocation seen.
IMPRESSION: Partially healing proximal humerus fracture with hardware fixation.

## 2019-05-19 IMAGING — CR DG SHOULDER 2+V*R*
3 series · 3 of 3 positions shown · non-contrast
Comparison: 01/18/2017 and 03/11/2017.

CLINICAL DATA: Right shoulder pain since the surgery for a proximal
humerus fracture 1 month ago.

EXAM:
RIGHT SHOULDER - 2+ VIEW

[shoulder grashey]
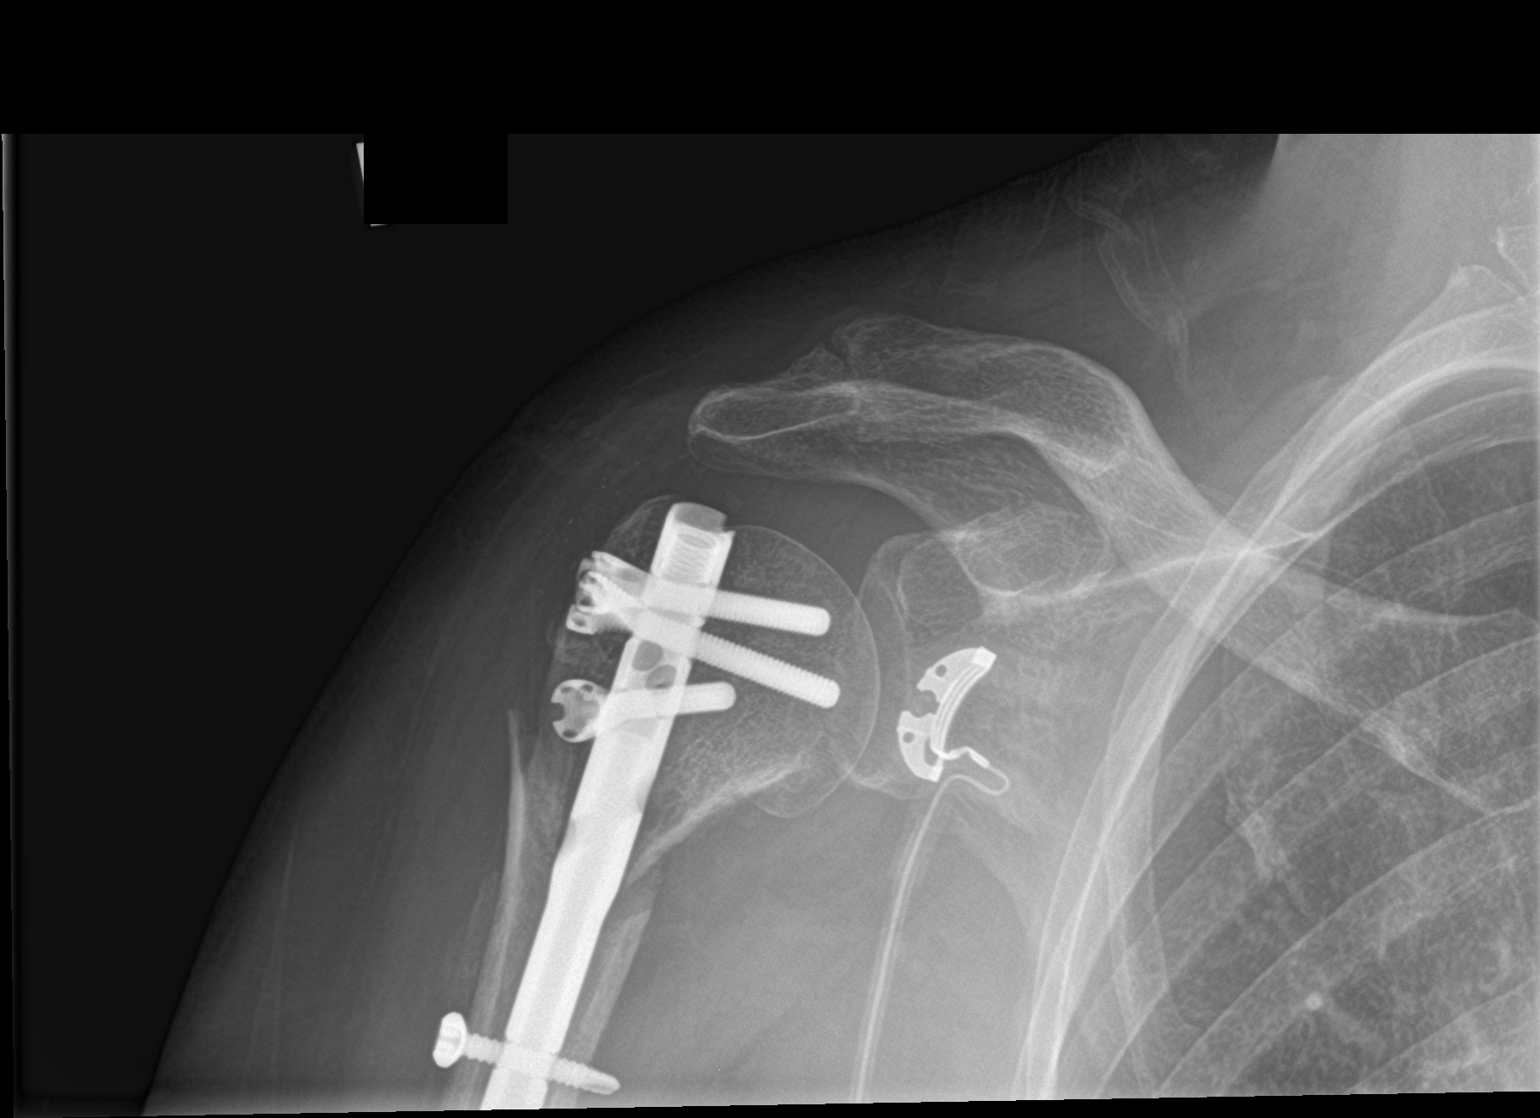

[shoulder y view]
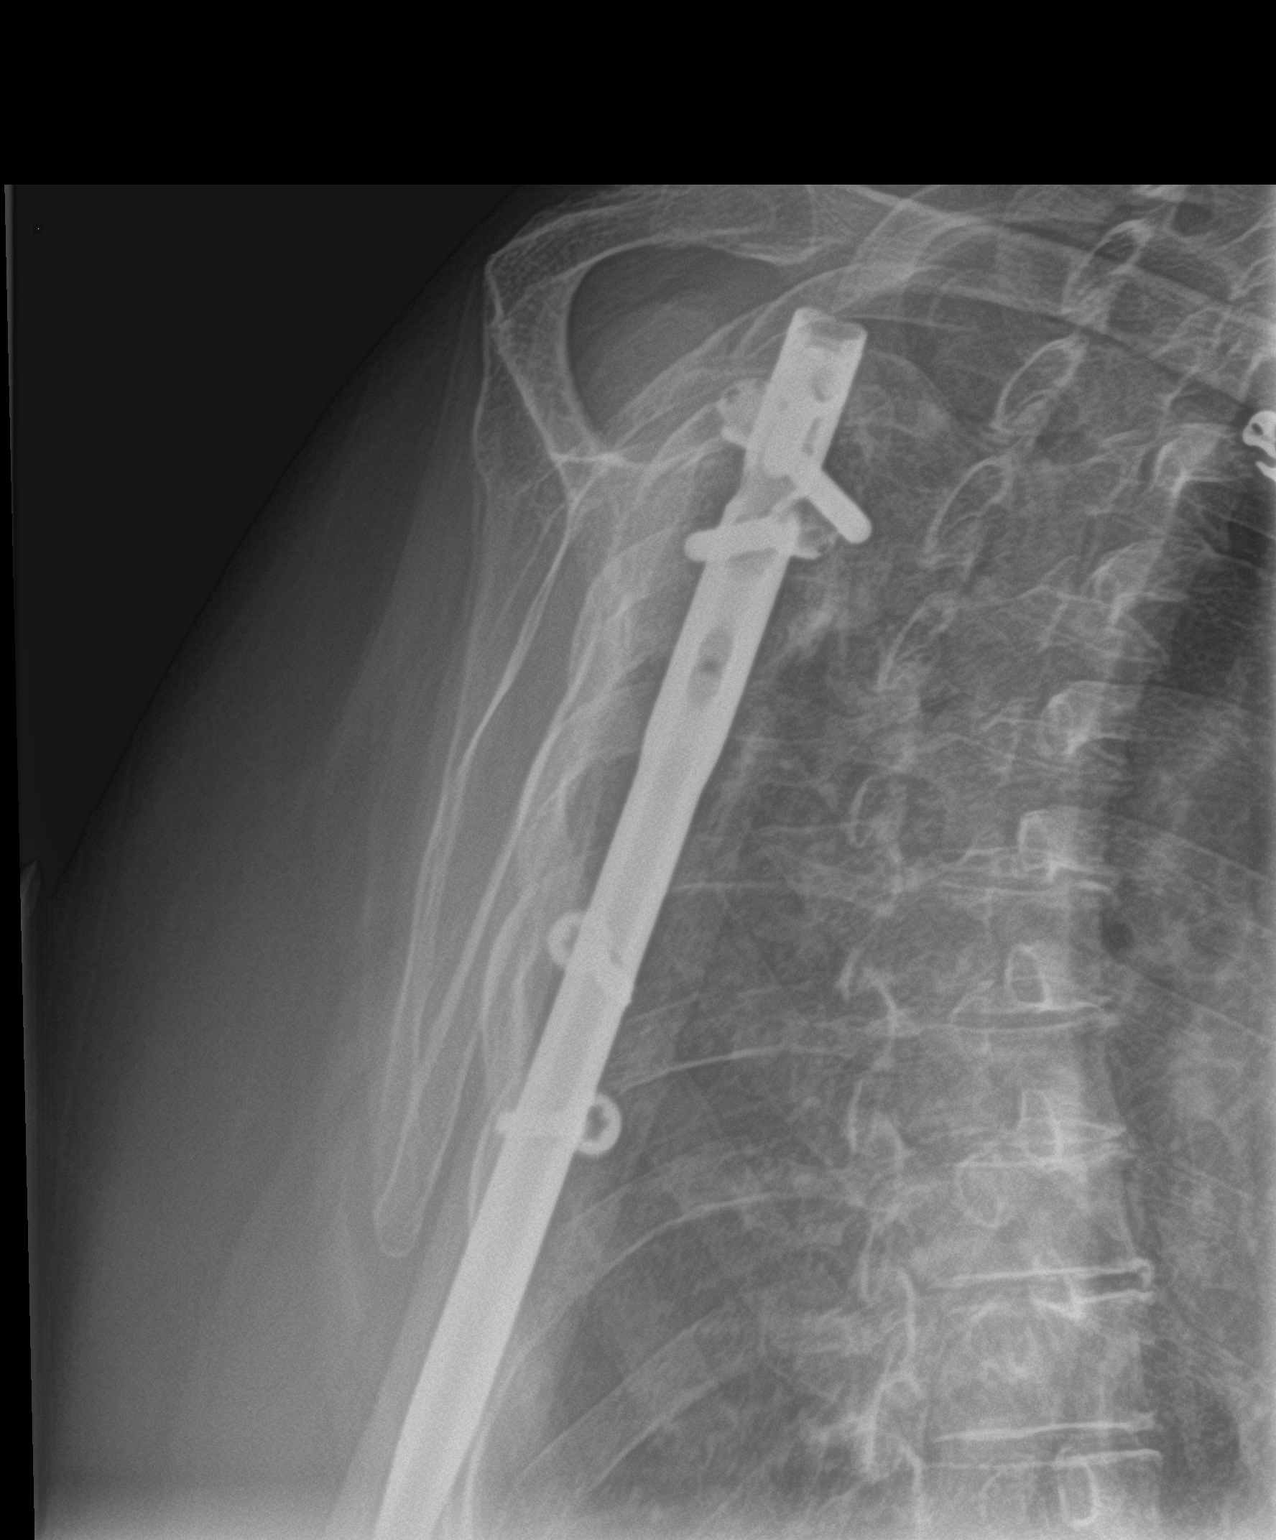

[shoulder axillary]
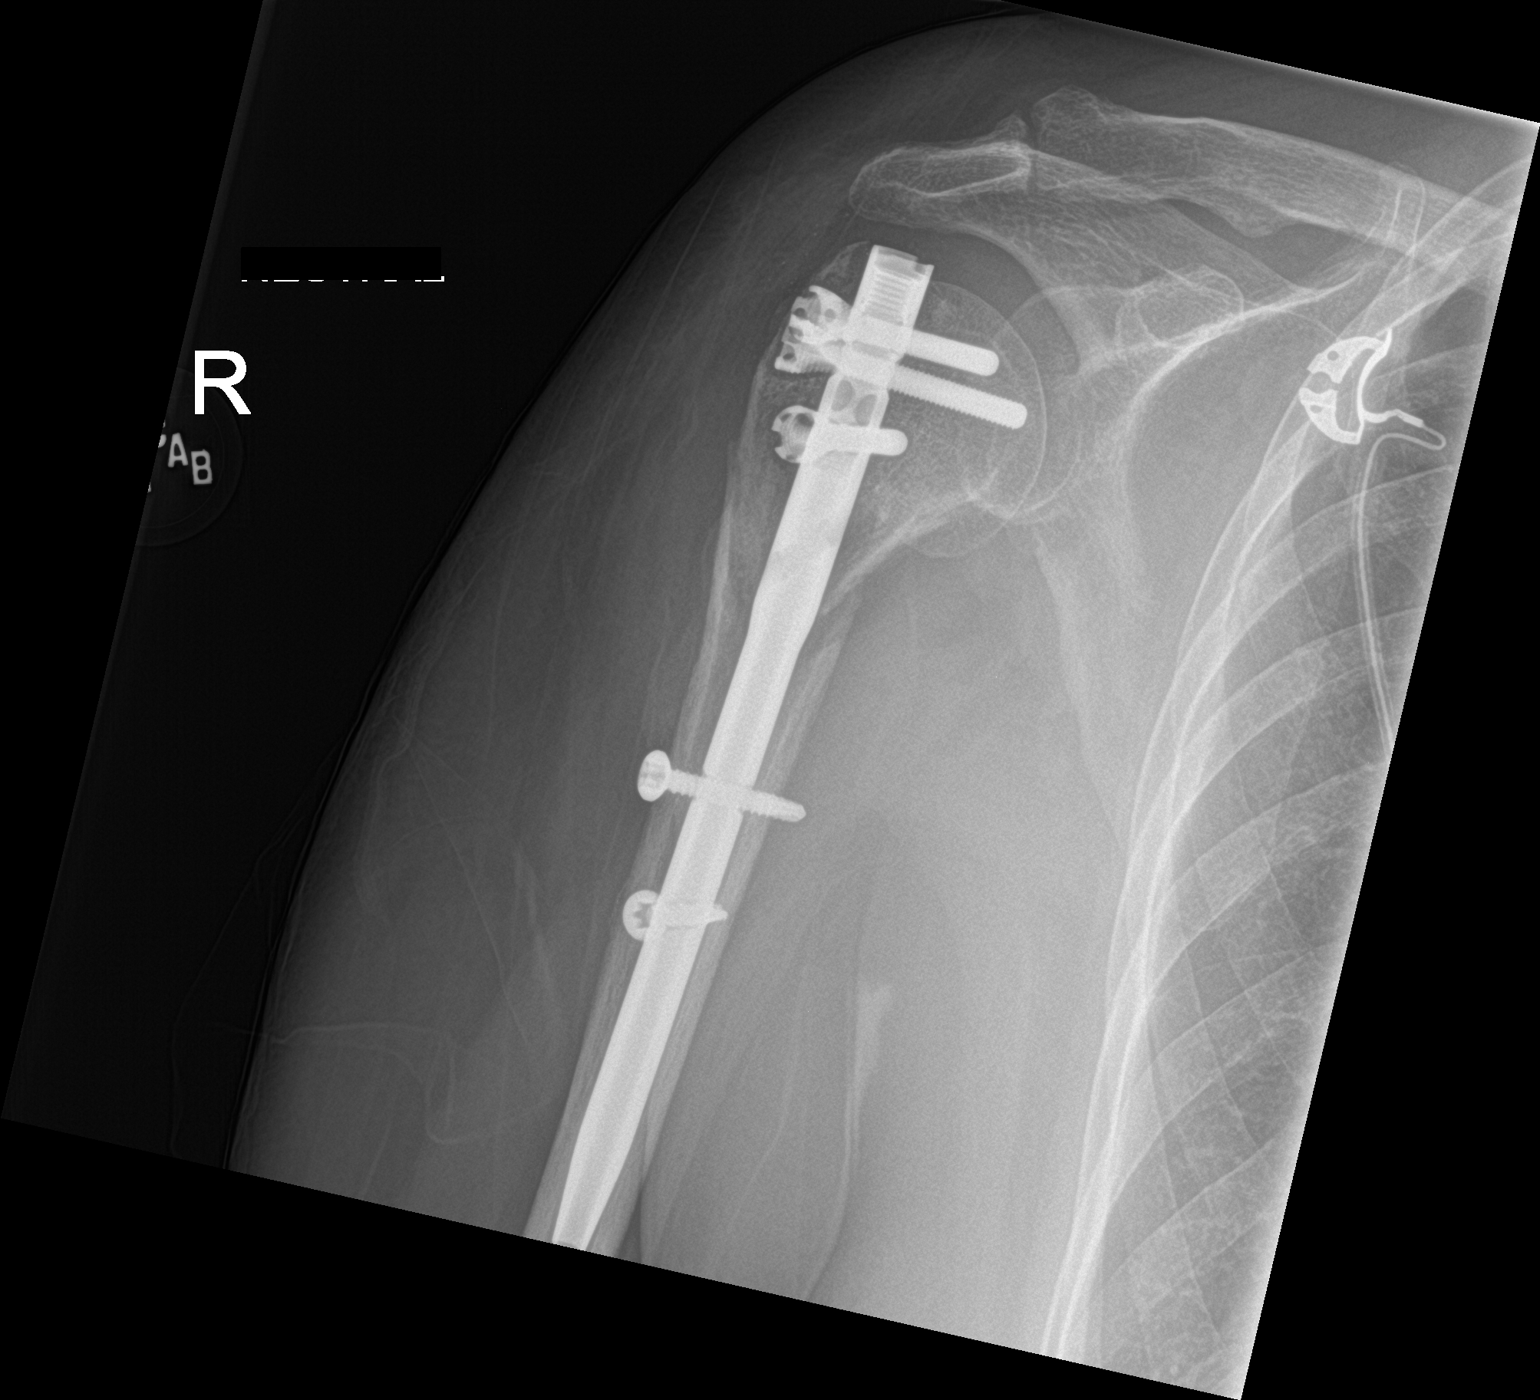

[3 of 3 positions shown; findings below may reference images not displayed]

FINDINGS: Intramedullary rod and screw fixation of the previously demonstrated
comminuted proximal humerus fracture. The fracture lines are still
visible with no interval bridging callus. No acute fracture or
dislocation seen.
IMPRESSION: Stable hardware fixation of a comminuted proximal right humerus
fracture with no interval healing.

## 2019-05-21 ENCOUNTER — Emergency Department (HOSPITAL_COMMUNITY)
Admission: EM | Admit: 2019-05-21 | Discharge: 2019-05-21 | Disposition: A | Discharge status: Home / Self Care | Payer: Medicaid Other | Attending: Emergency Medicine | Admitting: Emergency Medicine

## 2019-05-21 ENCOUNTER — Emergency Department (HOSPITAL_COMMUNITY): Payer: Medicaid Other

## 2019-05-21 ENCOUNTER — Other Ambulatory Visit: Payer: Self-pay

## 2019-05-21 ENCOUNTER — Encounter (HOSPITAL_COMMUNITY): Payer: Self-pay

## 2019-05-21 DIAGNOSIS — Y999 Unspecified external cause status: Secondary | ICD-10-CM | POA: Insufficient documentation

## 2019-05-21 DIAGNOSIS — Z8673 Personal history of transient ischemic attack (TIA), and cerebral infarction without residual deficits: Secondary | ICD-10-CM | POA: Insufficient documentation

## 2019-05-21 DIAGNOSIS — W010XXA Fall on same level from slipping, tripping and stumbling without subsequent striking against object, initial encounter: Secondary | ICD-10-CM | POA: Insufficient documentation

## 2019-05-21 DIAGNOSIS — Y929 Unspecified place or not applicable: Secondary | ICD-10-CM | POA: Insufficient documentation

## 2019-05-21 DIAGNOSIS — Y939 Activity, unspecified: Secondary | ICD-10-CM | POA: Insufficient documentation

## 2019-05-21 DIAGNOSIS — I10 Essential (primary) hypertension: Secondary | ICD-10-CM | POA: Diagnosis not present

## 2019-05-21 DIAGNOSIS — S99922A Unspecified injury of left foot, initial encounter: Secondary | ICD-10-CM | POA: Diagnosis present

## 2019-05-21 DIAGNOSIS — F17210 Nicotine dependence, cigarettes, uncomplicated: Secondary | ICD-10-CM | POA: Insufficient documentation

## 2019-05-21 DIAGNOSIS — S8255XA Nondisplaced fracture of medial malleolus of left tibia, initial encounter for closed fracture: Secondary | ICD-10-CM | POA: Diagnosis not present

## 2019-05-21 MED ORDER — OXYCODONE HCL 5 MG PO TABS
5.0000 mg | ORAL_TABLET | Freq: Once | ORAL | Status: AC
  Administered 2019-05-21: 17:00:00 5 mg via ORAL
  Filled 2019-05-21: qty 1

## 2019-05-21 MED ORDER — OXYCODONE HCL 5 MG PO TABS: 5.0000 mg | ORAL_TABLET | ORAL | 0 refills | Status: DC | PRN

## 2019-05-21 NOTE — ED Notes (Signed)
An After Visit Summary was printed and given to the patient. Discharge instructions given and no further questions at this time.  

## 2019-05-21 NOTE — ED Provider Notes (Signed)
[facility]-EMERGENCY DEPT Provider Note   CSN: [id number] Arrival date & time: 05/21/19  1324     History   Chief Complaint Chief Complaint  Patient presents with   Foot Injury    HPI Stephanie [name] is a 53 y.o. female.     HPI Patient presents to the emergency department with 3 AM she states that she tripped over her cat and hit the floor.  She states she had ankle pain immediately.  The patient states that she tried to walk on her foot but was unable to do so.  States she fell a few more times due to the fact that she is unable to walk on the foot.  The patient states that certain movements palpation make the pain worse.  Patient denies any other injuries.  She did not take any medications prior to arrival for her symptoms.  She states she not hit her head or lose consciousness.  She states she had no frank syncopal symptoms. Past Medical History:  Diagnosis Date   Diabetes mellitus without complication (HCC)    Difficult intubation    Diverticulitis    Headache    Hepatitis C    treated and resolved   History of kidney stones    Hypertension    IBS (irritable bowel syndrome)    Kidney stones    Sciatica    Sleep apnea    on cpap   Stroke (HCC) 2017   memory loss    Patient Active Problem List   Diagnosis Date Noted   Hypotension 08/22/2018   Bradycardia 08/22/2018   Type 2 diabetes mellitus (HCC) 08/22/2018   Acute encephalopathy 08/21/2018   Shoulder pain, right 12/22/2017   UTI (urinary tract infection) 08/21/2017   Nausea & vomiting 08/21/2017   Nicotine dependence 08/21/2017   Headache 01/07/2017   Small bowel obstruction (HCC) 09/02/2016   Pain and swelling of left upper extremity 07/22/2016   Superficial venous thrombosis of arm, left 07/22/2016   ICH (intracerebral hemorrhage) (HCC)    Essential hypertension, malignant 04/19/2016   Cytotoxic brain edema (HCC) 04/19/2016   IVH (intraventricular  hemorrhage) (HCC) 04/18/2016   Hepatitis C     Past Surgical History:  Procedure Laterality Date   ABDOMINAL HYSTERECTOMY     carpel tunnel syndrome  2017   HARDWARE REMOVAL Right 12/22/2017   Procedure: HARDWARE REMOVAL-RIGHT ARM;  Surgeon: Lyndle HerrlichBowers, James R, MD;  Location: [facility] ORS;  Service: Orthopedics;  Laterality: Right;  Removal of implant, superficial right humerus   HUMERUS IM NAIL Right 03/11/2017   Procedure: INTRAMEDULLARY (IM) NAIL HUMERAL;  Surgeon: Lyndle HerrlichBowers, James R, MD;  Location: [facility] ORS;  Service: Orthopedics;  Laterality: Right;   KIDNEY STONE SURGERY Right 02/12/12   KIDNEY STONE SURGERY Left 07/15/2012   LIGATION OF ARTERIOVENOUS  FISTULA Left 06/21/2016   Procedure: LIGATION OF ARTERIOVENOUS  FISTULA ( LIGATION BASILIC VEIN );  Surgeon: Renford DillsGregory G Schnier, MD;  Location: [facility] ORS;  Service: Vascular;  Laterality: Left;   LITHOTRIPSY     OOPHORECTOMY     SINUS EXPLORATION     TONSILLECTOMY       OB History   No obstetric history on file.      Home Medications    Prior to Admission medications   Medication Sig Start Date End Date Taking? Authorizing Provider  budesonide-formoterol (SYMBICORT) 160-4.5 MCG/ACT inhaler Inhale 2 puffs into the lungs 2 (two) times daily.    [provider]  cephALEXin (KEFLEX) 500  MG capsule Take 1 capsule (500 mg total) by mouth 3 (three) times daily. Patient not taking: Reported on 05/17/2019 04/21/19   Horton, Mayer Maskerourtney F, MD  ciprofloxacin (CIPRO) 500 MG tablet Take 500 mg by mouth 2 (two) times daily.    [provider]  cloNIDine (CATAPRES) 0.2 MG tablet Take 0.2 mg by mouth 2 (two) times daily. 10/02/18   [provider]  clopidogrel (PLAVIX) 75 MG tablet Take 1 tablet (75 mg total) by mouth daily. Patient not taking: Reported on 05/17/2019 06/27/16   Marvel PlanXu, Jindong, MD  etodolac (LODINE) 300 MG capsule Take 1 capsule (300 mg total) by mouth 3 (three) times daily as needed (pain). Patient not taking:  Reported on 03/07/2019 01/16/19   Linwood DibblesKnapp, Jon, MD  FLUoxetine (PROZAC) 40 MG capsule Take 40 mg by mouth daily.  06/28/18   [provider]  glipiZIDE (GLUCOTROL) 5 MG tablet Take 1 tablet (5 mg total) by mouth 2 (two) times daily before a meal. 04/30/16   Lora PaulaKrall, Jennifer T, MD  hydrALAZINE (APRESOLINE) 50 MG tablet Take 50 mg by mouth 3 (three) times daily. 12/08/18   [provider]  lamoTRIgine (LAMICTAL) 25 MG tablet Take 25-50 mg by mouth 2 (two) times a day. Take 25 mg in the am and 50 mg at night. 02/12/19 03/13/20  [provider]  loratadine (CLARITIN) 10 MG tablet Take 10 mg by mouth daily.    [provider]  losartan (COZAAR) 100 MG tablet Take 100 mg by mouth daily. 10/02/18   [provider]  methylPREDNISolone (MEDROL DOSEPAK) 4 MG TBPK tablet Take as directed on packet Patient not taking: Reported on 05/17/2019 04/21/19   Horton, Mayer Maskerourtney F, MD  oxyCODONE (ROXICODONE) 5 MG immediate release tablet Take 1 tablet (5 mg total) by mouth every 4 (four) hours as needed for severe pain. 05/21/19   Alondra Vandeven, Cristal Deerhristopher, PA-C  pantoprazole (PROTONIX) 40 MG tablet Take 40 mg by mouth daily.    [provider]  promethazine (PHENERGAN) 25 MG tablet Take 1 tablet (25 mg total) by mouth every 6 (six) hours as needed for nausea or vomiting. 04/21/19   Horton, Mayer Maskerourtney F, MD  traZODone (DESYREL) 100 MG tablet Take 100 mg by mouth at bedtime.    [provider]    Family History Family History  Problem Relation Age of Onset   Heart failure Mother    Diabetes Father    Cancer Sister     Social History Social History   Tobacco Use   Smoking status: Current Every Day Smoker    Packs/day: 1.00    Years: 1.50    Pack years: 1.50    Types: Cigarettes   Smokeless tobacco: Never Used  Substance Use Topics   Alcohol use: Not Currently    Comment: no alcohol since 2002   Drug use: No    Comment: electric cig     Allergies     Gabapentin, Ibuprofen, Tylenol [acetaminophen], Aspirin, Buprenorphine hcl, Codeine, Compazine, Ioxaglate, Ivp dye [iodinated diagnostic agents], Metrizamide, Naproxen, Norco [hydrocodone-acetaminophen], Penicillin g, Prochlorperazine maleate, Reglan [metoclopramide], Sulfur, Tegretol [carbamazepine], Toradol [ketorolac tromethamine], Tramadol, and Zofran [ondansetron hcl]   Review of Systems Review of Systems All other systems negative except as documented in the HPI. All pertinent positives and negatives as reviewed in the HPI.  Physical Exam Updated Vital Signs BP 110/80 (BP Location: Right Arm)    Pulse 67    Temp 98.7 F (37.1 C) (Oral)    Resp 16  Ht 5\' 2"  (1.575 m)    Wt 77.1 kg    SpO2 96%    BMI 31.09 kg/m   Physical Exam Vitals signs and nursing note reviewed.  Constitutional:      General: She is not in acute distress.    Appearance: She is well-developed.  HENT:     Head: Normocephalic and atraumatic.  Eyes:     Pupils: Pupils are equal, round, and reactive to light.  Pulmonary:     Effort: Pulmonary effort is normal.  Musculoskeletal:     Left ankle: She exhibits decreased range of motion and swelling. She exhibits normal pulse. Tenderness. Achilles tendon normal.  Skin:    General: Skin is warm and dry.  Neurological:     Mental Status: She is alert and oriented to person, place, and time.     Motor: No weakness.     Coordination: Coordination normal.      ED Treatments / Results  Labs (all labs ordered are listed, but only abnormal results are displayed) Labs Reviewed - No data to display  EKG None  Radiology Dg Ankle Complete Left  Result Date: 05/21/2019 CLINICAL DATA:  Trip and fall EXAM: LEFT FOOT - COMPLETE 3+ VIEW; LEFT ANKLE COMPLETE - 3+ VIEW COMPARISON:  None. FINDINGS: There is a mildly displaced fracture of the left medial malleolus. The lateral and posterior malleoli are intact. Diffuse soft tissue edema about the ankle. The joint spaces  are generally well preserved. There appears to be at least one periarticular erosion, of the lateral aspect of the fifth proximal phalanx about the proximal interphalangeal joint. IMPRESSION: 1. There is a mildly displaced fracture of the left medial malleolus. The lateral and posterior malleoli are intact. 2.  No fracture or dislocation of the left foot. 3. There appears to be at least one periarticular erosion, of the lateral aspect of the fifth proximal phalanx about the proximal interphalangeal joint. Findings suggest gouty arthropathy. Correlate with clinical history. Electronically Signed   By: Eddie Candle M.D.   On: 05/21/2019 15:21   Dg Foot Complete Left  Result Date: 05/21/2019 CLINICAL DATA:  Trip and fall EXAM: LEFT FOOT - COMPLETE 3+ VIEW; LEFT ANKLE COMPLETE - 3+ VIEW COMPARISON:  None. FINDINGS: There is a mildly displaced fracture of the left medial malleolus. The lateral and posterior malleoli are intact. Diffuse soft tissue edema about the ankle. The joint spaces are generally well preserved. There appears to be at least one periarticular erosion, of the lateral aspect of the fifth proximal phalanx about the proximal interphalangeal joint. IMPRESSION: 1. There is a mildly displaced fracture of the left medial malleolus. The lateral and posterior malleoli are intact. 2.  No fracture or dislocation of the left foot. 3. There appears to be at least one periarticular erosion, of the lateral aspect of the fifth proximal phalanx about the proximal interphalangeal joint. Findings suggest gouty arthropathy. Correlate with clinical history. Electronically Signed   By: Eddie Candle M.D.   On: 05/21/2019 15:21    Procedures Procedures (including critical care time)  Medications Ordered in ED Medications  oxyCODONE (Oxy IR/ROXICODONE) immediate release tablet 5 mg (5 mg Oral Given 05/21/19 1646)     Initial Impression / Assessment and Plan / ED Course  I have reviewed the triage vital signs  and the nursing notes.  Pertinent labs & imaging results that were available during my care of the patient were reviewed by me and considered in my medical decision making (see  chart for details).        Patient will be placed in a short leg splint with crutches.  Patient is advised to follow-up with orthopedics who she seen in the past.  Patient states that she has no questions and agrees to this plan.  Told to ice and elevate her ankle.  Final Clinical Impressions(s) / ED Diagnoses   Final diagnoses:  Closed nondisplaced fracture of medial malleolus of left tibia, initial encounter    ED Discharge Orders         Ordered    oxyCODONE (ROXICODONE) 5 MG immediate release tablet  Every 4 hours PRN     05/21/19 1630           Saya Mccoll, Clay, PA-C 05/25/19 1503    Rolan Bucco, MD 06/01/19 [phone]

## 2019-05-21 NOTE — ED Triage Notes (Addendum)
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 Patient states she tripped over the cat, fell and hit the door. patient c/o left foot and left ankle pain. Patient states, "I feel 4 more times because I was trying to walk on my left foot."

## 2019-05-21 NOTE — Discharge Instructions (Addendum)
Follow-up with the orthopedist provided.  Return here as needed.  Elevate the ankle.

## 2019-05-28 ENCOUNTER — Telehealth: Payer: Self-pay | Admitting: Orthopaedic Surgery

## 2019-05-28 NOTE — Telephone Encounter (Signed)
Called patient got recording mailbox is full- left phone number to call back to schedule an appointment with Jeneen Rinks or another provider    Patient went to [facility] ER left foot fracture  [phone]

## 2019-06-09 IMAGING — CT CT VENOGRAM HEAD
3 of 4 series · 19 of 47 positions shown · IV contrast (iopamidol)
Comparison: CT head without and with contrast from the same day.

CLINICAL DATA: Intermittent headaches and weakness. Remote infarct.

EXAM:
CT VENOGRAM HEAD
TECHNIQUE: Venous phase imaging of the head was performed after administration
of IV contrast to evaluate the dural sinuses of the brain.
CONTRAST:  75mL PIGT2T-1SS IOPAMIDOL (PIGT2T-1SS) INJECTION 61%

[Series 2: head wo · axial · 0.41mm/px · z∈[+153,+283]mm · 13 of 32 slices shown]
[im 3/32  brain]
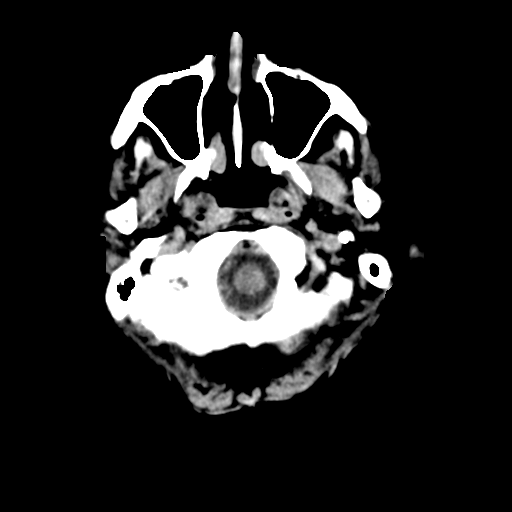
[im 5/32  bone]
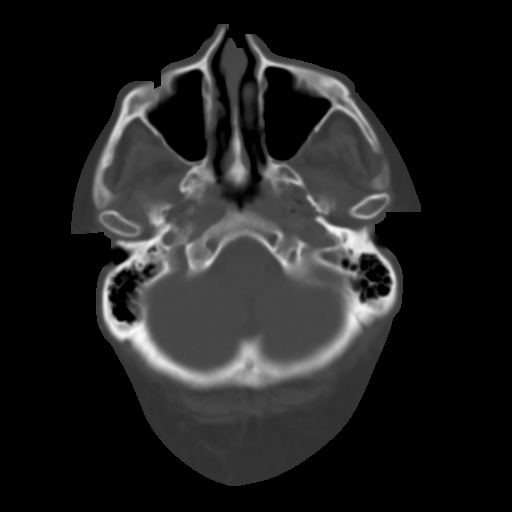
[im 7/32  brain]
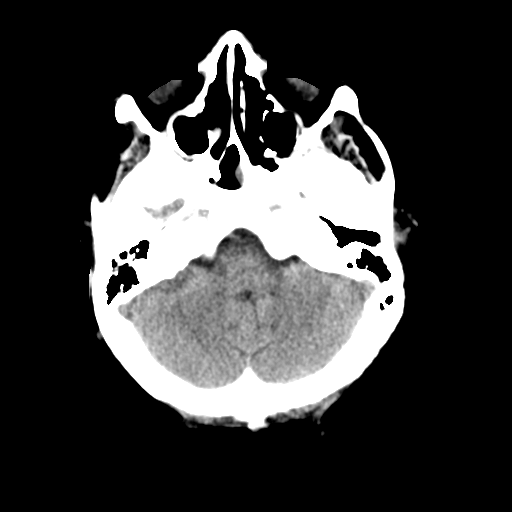
[im 9/32  bone]
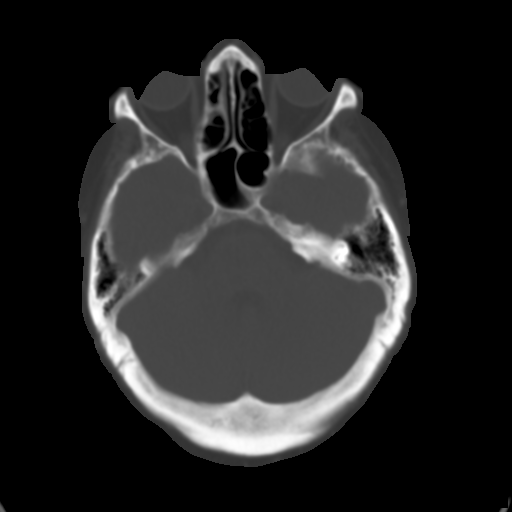
[im 12/32  brain]
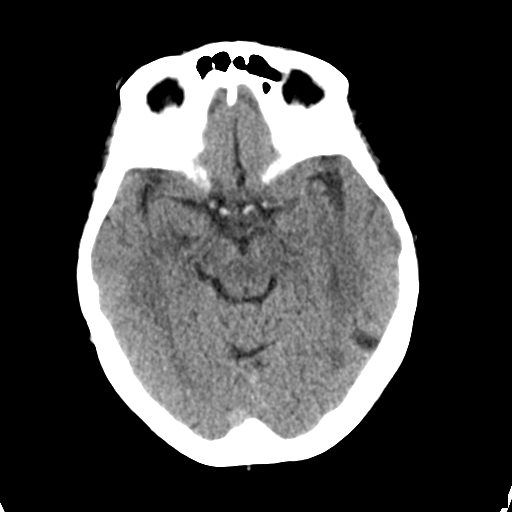
[im 14/32  bone]
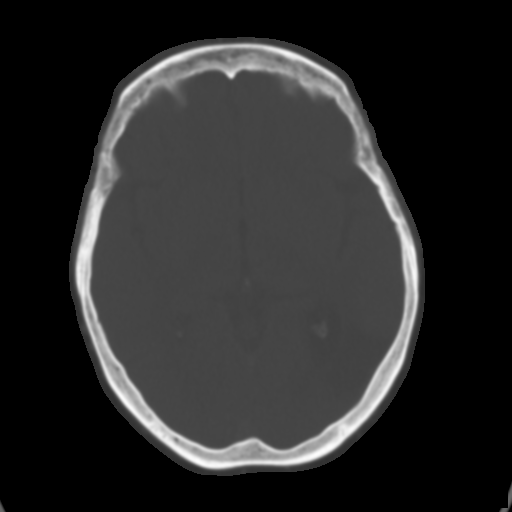
[im 16/32  brain]
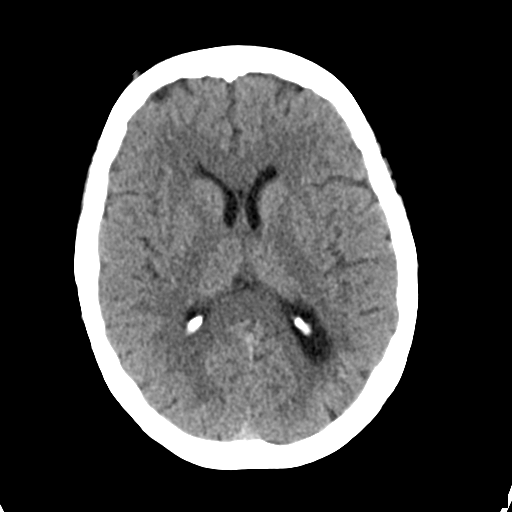
[im 18/32  bone]
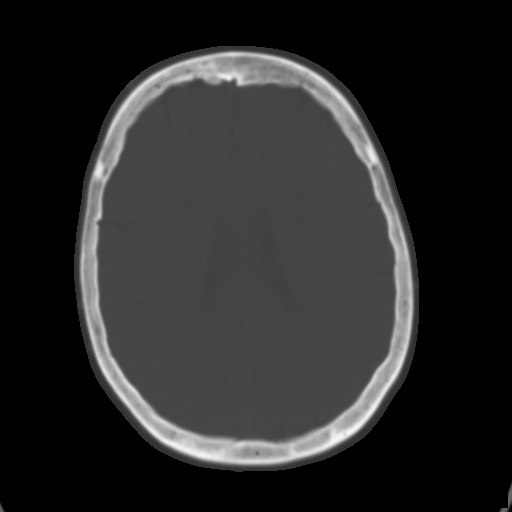
[im 20/32  brain]
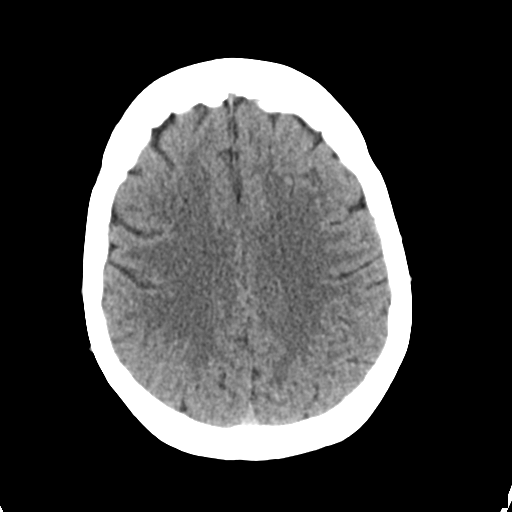
[im 23/32  bone]
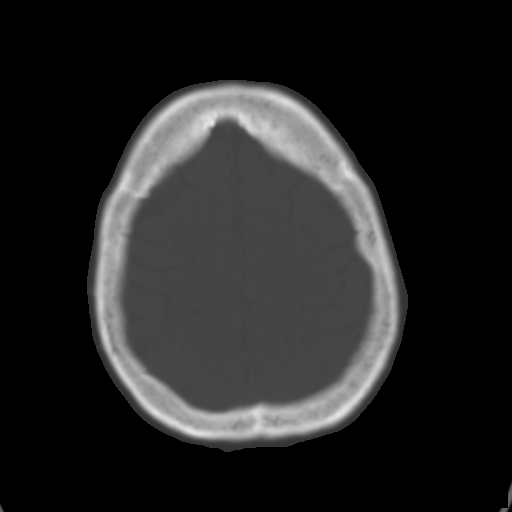
[im 25/32  brain]
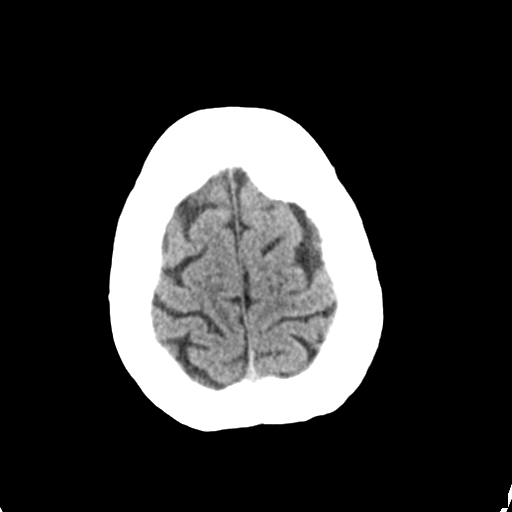
[im 27/32  bone]
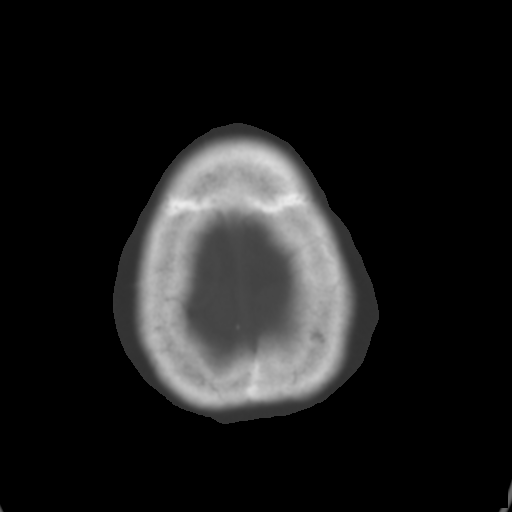
[im 29/32  brain]
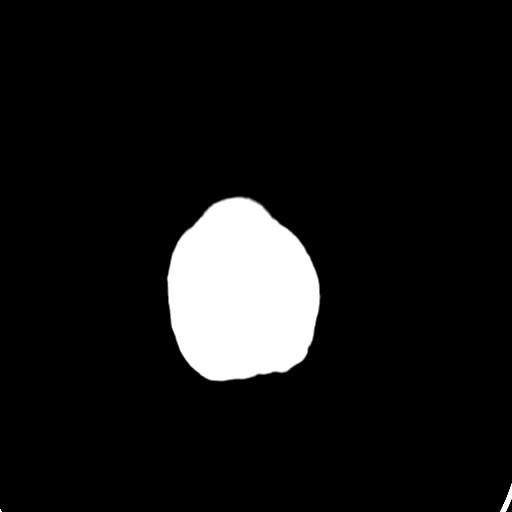

[Series 5: coronal soft tissue · coronal · 0.31mm/px · 3 of 61 slices shown]
[im 21/61  brain]
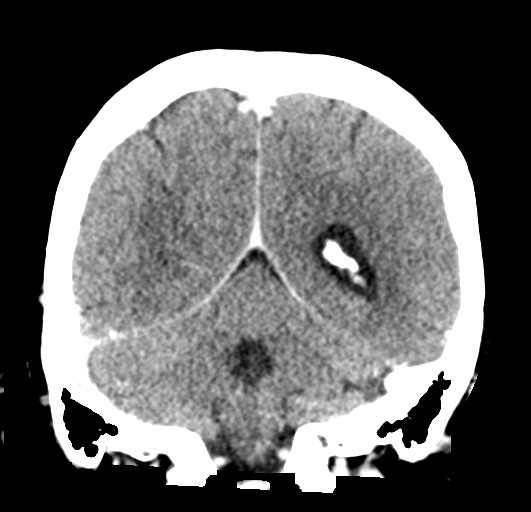
[im 27/61  brain]
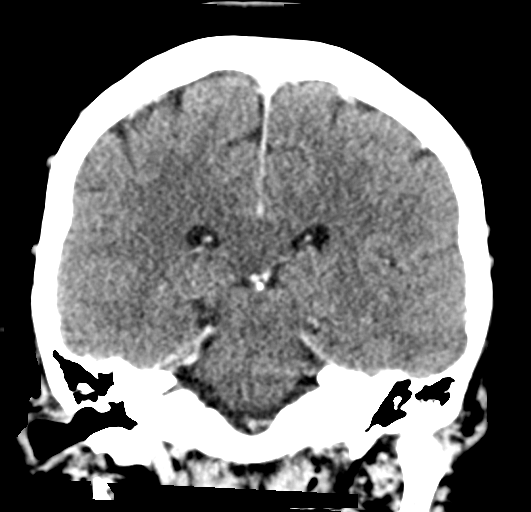
[im 34/61  brain]
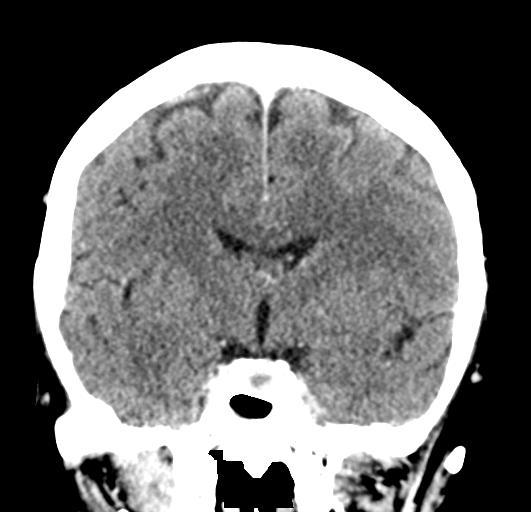

[Series 6: sagittal soft tissue · sagittal · 0.31mm/px · 3 of 49 slices shown]
[im 17/49  brain]
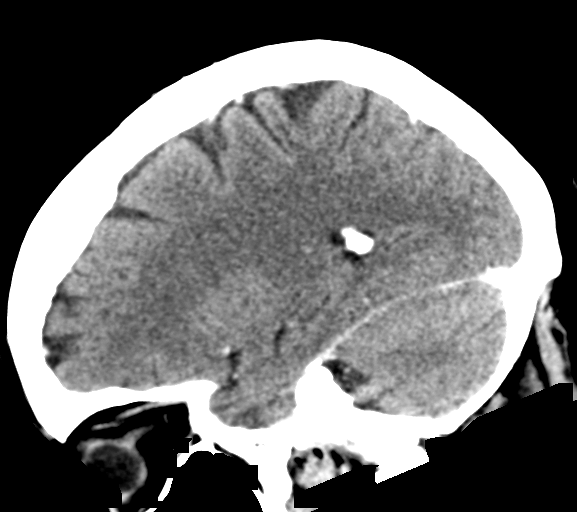
[im 25/49  brain]
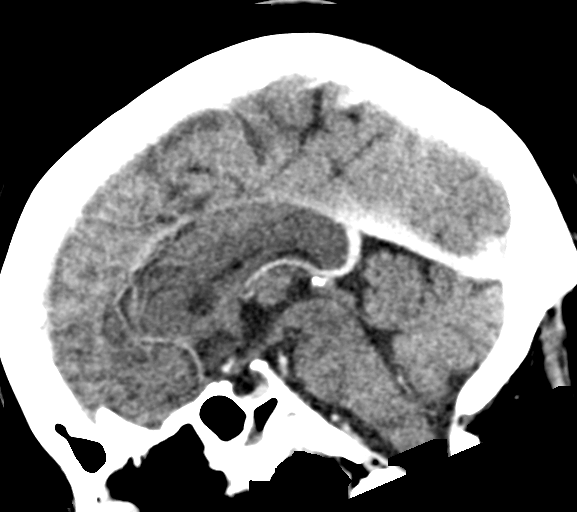
[im 33/49  brain]
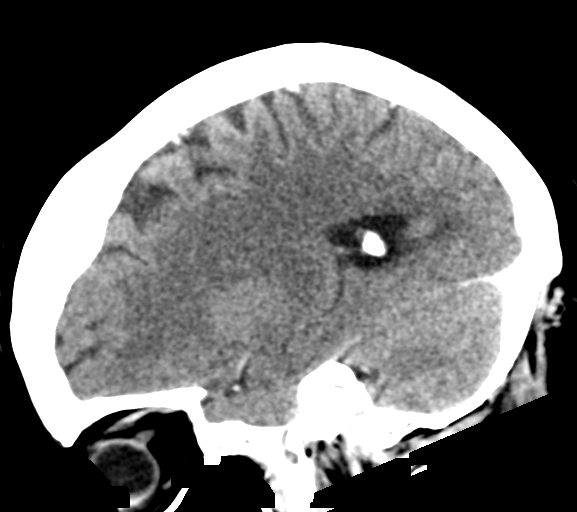

[19 of 47 positions shown; findings below may reference images not displayed]

FINDINGS: The dural sinuses are patent. The right transverse sinus is
dominant. Straight sinus deep cerebral veins are intact.

Cortical veins are unremarkable.
IMPRESSION: Negative CT venogram of the head.

## 2019-07-17 ENCOUNTER — Emergency Department (HOSPITAL_BASED_OUTPATIENT_CLINIC_OR_DEPARTMENT_OTHER): Payer: Medicaid Other

## 2019-07-17 ENCOUNTER — Encounter (HOSPITAL_COMMUNITY): Payer: Self-pay | Admitting: Emergency Medicine

## 2019-07-17 ENCOUNTER — Emergency Department (HOSPITAL_COMMUNITY): Payer: Medicaid Other

## 2019-07-17 ENCOUNTER — Emergency Department (HOSPITAL_COMMUNITY)
Admission: EM | Admit: 2019-07-17 | Discharge: 2019-07-17 | Disposition: A | Discharge status: Home / Self Care | Payer: Medicaid Other | Attending: Emergency Medicine | Admitting: Emergency Medicine

## 2019-07-17 ENCOUNTER — Other Ambulatory Visit: Payer: Self-pay

## 2019-07-17 DIAGNOSIS — R52 Pain, unspecified: Secondary | ICD-10-CM

## 2019-07-17 DIAGNOSIS — I10 Essential (primary) hypertension: Secondary | ICD-10-CM | POA: Insufficient documentation

## 2019-07-17 DIAGNOSIS — Z79899 Other long term (current) drug therapy: Secondary | ICD-10-CM | POA: Insufficient documentation

## 2019-07-17 DIAGNOSIS — W19XXXD Unspecified fall, subsequent encounter: Secondary | ICD-10-CM | POA: Insufficient documentation

## 2019-07-17 DIAGNOSIS — E119 Type 2 diabetes mellitus without complications: Secondary | ICD-10-CM | POA: Insufficient documentation

## 2019-07-17 DIAGNOSIS — S8255XD Nondisplaced fracture of medial malleolus of left tibia, subsequent encounter for closed fracture with routine healing: Secondary | ICD-10-CM

## 2019-07-17 DIAGNOSIS — M79662 Pain in left lower leg: Secondary | ICD-10-CM | POA: Diagnosis present

## 2019-07-17 DIAGNOSIS — F17210 Nicotine dependence, cigarettes, uncomplicated: Secondary | ICD-10-CM | POA: Insufficient documentation

## 2019-07-17 MED ORDER — LIDOCAINE 5 % EX PTCH: 1.0000 | MEDICATED_PATCH | CUTANEOUS | 0 refills | Status: DC

## 2019-07-17 MED ORDER — OXYCODONE-ACETAMINOPHEN 5-325 MG PO TABS: 1.0000 | ORAL_TABLET | ORAL | 0 refills | Status: DC | PRN

## 2019-07-17 MED ORDER — OXYCODONE HCL 5 MG PO TABS
5.0000 mg | ORAL_TABLET | Freq: Once | ORAL | Status: AC
  Administered 2019-07-17: 5 mg via ORAL
  Filled 2019-07-17: qty 1

## 2019-07-17 NOTE — ED Provider Notes (Addendum)
[facility] EMERGENCY DEPARTMENT Provider Note   CSN: [id number] Arrival date & time: 07/17/19  1323   History   Chief Complaint Chief Complaint  Patient presents with   Foot Pain   HPI Stephanie [name] is a 53 y.o. female with past medical history significant for hepatitis C, CVA, chronic pain who presents for evaluation of left foot and ankle pain.  She sustained a left medial malleolus fracture on 05/21/2019.  She was splinted and told to follow-up with orthopedics.  Per patient she has follow-up with orthopedics.  She is also been seen outpatient and other emergency departments for chronic ongoing pain.  She states she last saw orthopedics 2 weeks ago.  She denies any numbness or tingling, decreased range of motion, redness, swelling, warmth, color change, wounds to her extremities.  Denies fever, chills, nausea, vomiting, chest pain, shortness of breath, headache, unilateral weakness, extremity weakness, recent falls/injuries, anticoagulation, rashes, lesion.  Patient states her pain has been chronic over the last 2 months.  Per her PMP aware database she has refilled multiple pain medications and has an overdose score of 800.  Patient states she has been using a walker at home as well as an Ace wrap to her extremity.  She denies additional aggravating or alleviating factors.  She rates her current pain a 9/10.  Describes her pain as aching.  Pain worse with ambulation.  History obtained from patient and past medical records.  No interpreter was used.     HPI  Past Medical History:  Diagnosis Date   Diabetes mellitus without complication (HCC)    Difficult intubation    Diverticulitis    Headache    Hepatitis C    treated and resolved   History of kidney stones    Hypertension    IBS (irritable bowel syndrome)    Kidney stones    Sciatica    Sleep apnea    on cpap   Stroke (HCC) 2017   memory loss    Patient Active Problem List   Diagnosis  Date Noted   Hypotension 08/22/2018   Bradycardia 08/22/2018   Type 2 diabetes mellitus (HCC) 08/22/2018   Acute encephalopathy 08/21/2018   Shoulder pain, right 12/22/2017   UTI (urinary tract infection) 08/21/2017   Nausea & vomiting 08/21/2017   Nicotine dependence 08/21/2017   Headache 01/07/2017   Small bowel obstruction (HCC) 09/02/2016   Pain and swelling of left upper extremity 07/22/2016   Superficial venous thrombosis of arm, left 07/22/2016   ICH (intracerebral hemorrhage) (HCC)    Essential hypertension, malignant 04/19/2016   Cytotoxic brain edema (HCC) 04/19/2016   IVH (intraventricular hemorrhage) (HCC) 04/18/2016   Hepatitis C     Past Surgical History:  Procedure Laterality Date   ABDOMINAL HYSTERECTOMY     carpel tunnel syndrome  2017   HARDWARE REMOVAL Right 12/22/2017   Procedure: HARDWARE REMOVAL-RIGHT ARM;  Surgeon: Lyndle HerrlichBowers, James R, MD;  Location: [facility] ORS;  Service: Orthopedics;  Laterality: Right;  Removal of implant, superficial right humerus   HUMERUS IM NAIL Right 03/11/2017   Procedure: INTRAMEDULLARY (IM) NAIL HUMERAL;  Surgeon: Lyndle HerrlichBowers, James R, MD;  Location: [facility] ORS;  Service: Orthopedics;  Laterality: Right;   KIDNEY STONE SURGERY Right 02/12/12   KIDNEY STONE SURGERY Left 07/15/2012   LIGATION OF ARTERIOVENOUS  FISTULA Left 06/21/2016   Procedure: LIGATION OF ARTERIOVENOUS  FISTULA ( LIGATION BASILIC VEIN );  Surgeon: Renford DillsGregory G Schnier, MD;  Location: [facility] ORS;  Service: Vascular;  Laterality: Left;   LITHOTRIPSY     OOPHORECTOMY     SINUS EXPLORATION     TONSILLECTOMY       OB History   No obstetric history on file.      Home Medications    Prior to Admission medications   Medication Sig Start Date End Date Taking? Authorizing Provider  budesonide-formoterol (SYMBICORT) 160-4.5 MCG/ACT inhaler Inhale 2 puffs into the lungs 2 (two) times daily.    [provider]  cephALEXin (KEFLEX) 500 MG capsule Take  1 capsule (500 mg total) by mouth 3 (three) times daily. Patient not taking: Reported on 05/17/2019 04/21/19   Horton, Mayer Masker, MD  ciprofloxacin (CIPRO) 500 MG tablet Take 500 mg by mouth 2 (two) times daily.    [provider]  cloNIDine (CATAPRES) 0.2 MG tablet Take 0.2 mg by mouth 2 (two) times daily. 10/02/18   [provider]  clopidogrel (PLAVIX) 75 MG tablet Take 1 tablet (75 mg total) by mouth daily. Patient not taking: Reported on 05/17/2019 06/27/16   Marvel Plan, MD  etodolac (LODINE) 300 MG capsule Take 1 capsule (300 mg total) by mouth 3 (three) times daily as needed (pain). Patient not taking: Reported on 03/07/2019 01/16/19   Linwood Dibbles, MD  FLUoxetine (PROZAC) 40 MG capsule Take 40 mg by mouth daily.  06/28/18   [provider]  glipiZIDE (GLUCOTROL) 5 MG tablet Take 1 tablet (5 mg total) by mouth 2 (two) times daily before a meal. 04/30/16   Lora Paula, MD  hydrALAZINE (APRESOLINE) 50 MG tablet Take 50 mg by mouth 3 (three) times daily. 12/08/18   [provider]  lamoTRIgine (LAMICTAL) 25 MG tablet Take 25-50 mg by mouth 2 (two) times a day. Take 25 mg in the am and 50 mg at night. 02/12/19 03/13/20  [provider]  lidocaine (LIDODERM) 5 % Place 1 patch onto the skin daily. Remove & Discard patch within 12 hours or as directed by MD 07/17/19   Teletha Petrea A, PA-C  lidocaine (LIDODERM) 5 % Place 1 patch onto the skin daily. Remove & Discard patch within 12 hours or as directed by MD 07/17/19   Mervil Wacker A, PA-C  loratadine (CLARITIN) 10 MG tablet Take 10 mg by mouth daily.    [provider]  losartan (COZAAR) 100 MG tablet Take 100 mg by mouth daily. 10/02/18   [provider]  methylPREDNISolone (MEDROL DOSEPAK) 4 MG TBPK tablet Take as directed on packet Patient not taking: Reported on 05/17/2019 04/21/19   Horton, Mayer Masker, MD  oxyCODONE (ROXICODONE) 5 MG immediate release tablet Take 1 tablet (5 mg total) by  mouth every 4 (four) hours as needed for severe pain. 05/21/19   Lawyer, Cristal Deer, PA-C  oxyCODONE-acetaminophen (PERCOCET/ROXICET) 5-325 MG tablet Take 1 tablet by mouth every 4 (four) hours as needed for severe pain. 07/17/19   Arif Amendola A, PA-C  oxyCODONE-acetaminophen (PERCOCET/ROXICET) 5-325 MG tablet Take 1 tablet by mouth every 4 (four) hours as needed for severe pain. 07/17/19   Afiya Ferrebee A, PA-C  pantoprazole (PROTONIX) 40 MG tablet Take 40 mg by mouth daily.    [provider]  promethazine (PHENERGAN) 25 MG tablet Take 1 tablet (25 mg total) by mouth every 6 (six) hours as needed for nausea or vomiting. 04/21/19   Horton, Mayer Masker, MD  traZODone (DESYREL) 100 MG tablet Take 100 mg by mouth at bedtime.    [provider]    [facility]  History Family History  Problem Relation Age of Onset   Heart failure Mother    Diabetes Father    Cancer Sister     Social History Social History   Tobacco Use   Smoking status: Current Every Day Smoker    Packs/day: 1.00    Years: 1.50    Pack years: 1.50    Types: Cigarettes   Smokeless tobacco: Never Used  Substance Use Topics   Alcohol use: Not Currently    Comment: no alcohol since 2002   Drug use: No    Comment: electric cig     Allergies   Gabapentin, Ibuprofen, Tylenol [acetaminophen], Aspirin, Buprenorphine hcl, Codeine, Compazine, Ioxaglate, Ivp dye [iodinated diagnostic agents], Metrizamide, Naproxen, Norco [hydrocodone-acetaminophen], Penicillin g, Prochlorperazine maleate, Reglan [metoclopramide], Sulfur, Tegretol [carbamazepine], Toradol [ketorolac tromethamine], Tramadol, and Zofran [ondansetron hcl]   Review of Systems Review of Systems  Constitutional: Negative.   HENT: Negative.   Respiratory: Negative.   Cardiovascular: Negative.   Gastrointestinal: Negative.   Genitourinary: Negative.   Musculoskeletal: Negative for arthralgias, back pain, joint swelling, myalgias, neck pain  and neck stiffness.       Lower extremity pain  Skin: Negative.   Neurological: Negative.   All other systems reviewed and are negative.   Physical Exam Updated Vital Signs BP (!) 153/92 (BP Location: Right Arm)    Pulse 67    Temp 98.2 F (36.8 C) (Oral)    Resp 16    Wt 86.2 kg    SpO2 97%    BMI 34.75 kg/m   Physical Exam Vitals signs and nursing note reviewed.  Constitutional:      General: She is not in acute distress.    Appearance: She is well-developed. She is not ill-appearing or toxic-appearing.  HENT:     Head: Normocephalic and atraumatic.     Nose: Nose normal.     Mouth/Throat:     Mouth: Mucous membranes are moist.     Pharynx: Oropharynx is clear.  Eyes:     Pupils: Pupils are equal, round, and reactive to light.  Neck:     Musculoskeletal: Normal range of motion.  Cardiovascular:     Rate and Rhythm: Normal rate.     Pulses: Normal pulses.          Dorsalis pedis pulses are 2+ on the right side and 2+ on the left side.       Posterior tibial pulses are 2+ on the right side and 2+ on the left side.     Heart sounds: Normal heart sounds.  Pulmonary:     Effort: Pulmonary effort is normal. No respiratory distress.     Breath sounds: Normal breath sounds.  Abdominal:     General: Bowel sounds are normal. There is no distension.  Musculoskeletal: Normal range of motion.     Right knee: Normal.     Left knee: Normal.     Right ankle: Normal.     Left ankle: She exhibits normal range of motion, no swelling, no ecchymosis, no deformity, no laceration and normal pulse. Tenderness. Lateral malleolus and medial malleolus tenderness found. Achilles tendon exhibits no pain, no defect and normal Thompson's test results.     Right upper leg: Normal.     Left lower leg: She exhibits tenderness. She exhibits no bony tenderness, no swelling, no deformity and no laceration. No edema.     Right foot: Normal. Normal range of motion. No deformity.     Left foot: Normal  range  of motion and normal capillary refill. Tenderness present. No bony tenderness, swelling, crepitus, deformity or laceration.     Comments: Diffuse tenderness to left calf, lateral and medial malleolus as well as entire left foot.  Full range of motion with plantarflexion, dorsiflexion as well as inversion and eversion.  Wiggles toes without difficulty.  No bony tenderness.  Her compartments are soft.  She has 2+, DP, PT pulses bilaterally.  She has good skin perfusion.  Feet:     Right foot:     Protective Sensation: 3 sites tested. 3 sites sensed.     Skin integrity: Skin integrity normal.     Toenail Condition: Right toenails are abnormally thick.     Left foot:     Protective Sensation: 3 sites tested. 3 sites sensed.     Skin integrity: Skin integrity normal.     Toenail Condition: Left toenails are abnormally thick.     Comments: Thickened toenails bilaterally. Skin:    General: Skin is warm and dry.     Capillary Refill: Capillary refill takes less than 2 seconds.     Comments: No edema, erythema, ecchymosis or warmth.  No fluctuance or induration.  No rashes or lesions.  Neurological:     General: No focal deficit present.     Mental Status: She is alert.     Sensory: Sensation is intact.     Motor: Motor function is intact.     Coordination: Coordination is intact.     Comments: Intact sensation to sharp and dull bilateral lower extremities. Cranial nerves II through XII grossly intact. 5/5 rings to bilateral lower extremities without difficulty. Patient unwilling to ambulate secondary to pain however states she has been using a walker at home for ambulation.    ED Treatments / Results  Labs (all labs ordered are listed, but only abnormal results are displayed) Labs Reviewed - No data to display  EKG None  Radiology Dg Ankle Complete Left  Result Date: 07/17/2019 CLINICAL DATA:  Medial malleolus fracture, pain EXAM: LEFT FOOT - COMPLETE 3+ VIEW; LEFT ANKLE COMPLETE - 3+  VIEW COMPARISON:  06/16/2019, 05/21/1999 FINDINGS: Redemonstrated, mildly displaced fracture of the left medial malleolus of the ankle, with interval formation of some bony callus and adjacent heterotopic ossification, consistent with early healing. No other fracture or dislocation of the left foot or ankle. There is a redemonstrated, possible periarticular erosion of the left fifth proximal phalanx abutting the proximal interphalangeal joint. Joint spaces are well preserved. Mild soft tissue edema about the left ankle. IMPRESSION: 1. Redemonstrated, mildly displaced fracture of the left medial malleolus of the ankle, with interval formation of some bony callus and adjacent heterotopic ossification, consistent with early healing. 2.  No other fracture or dislocation of the left foot or ankle. 3. Mild soft tissue edema about the left ankle. Electronically Signed   By: Lauralyn Primes M.D.   On: 07/17/2019 15:29   Dg Foot Complete Left  Result Date: 07/17/2019 CLINICAL DATA:  Medial malleolus fracture, pain EXAM: LEFT FOOT - COMPLETE 3+ VIEW; LEFT ANKLE COMPLETE - 3+ VIEW COMPARISON:  06/16/2019, 05/21/1999 FINDINGS: Redemonstrated, mildly displaced fracture of the left medial malleolus of the ankle, with interval formation of some bony callus and adjacent heterotopic ossification, consistent with early healing. No other fracture or dislocation of the left foot or ankle. There is a redemonstrated, possible periarticular erosion of the left fifth proximal phalanx abutting the proximal interphalangeal joint. Joint spaces are well preserved. Mild  soft tissue edema about the left ankle. IMPRESSION: 1. Redemonstrated, mildly displaced fracture of the left medial malleolus of the ankle, with interval formation of some bony callus and adjacent heterotopic ossification, consistent with early healing. 2.  No other fracture or dislocation of the left foot or ankle. 3. Mild soft tissue edema about the left ankle. Electronically  Signed   By: Eddie Candle M.D.   On: 07/17/2019 15:29   Vas [place] Lower Extremity Venous (dvt) (only [facility])  Result Date: 07/17/2019  Lower Venous Study Indications: Pain.  Comparison Study: No prior Performing Technologist: Sharion Dove RVS  Examination Guidelines: A complete evaluation includes B-mode imaging, spectral Doppler, color Doppler, and power Doppler as needed of all accessible portions of each vessel. Bilateral testing is considered an integral part of a complete examination. Limited examinations for reoccurring indications may be performed as noted.  +-----+---------------+---------+-----------+----------+--------------+  RIGHT Compressibility Phasicity Spontaneity Properties Thrombus Aging  +-----+---------------+---------+-----------+----------+--------------+  CFV   Full            Yes       Yes                                    +-----+---------------+---------+-----------+----------+--------------+   +---------+---------------+---------+-----------+----------+--------------+  LEFT      Compressibility Phasicity Spontaneity Properties Thrombus Aging  +---------+---------------+---------+-----------+----------+--------------+  CFV       Full            Yes       Yes                                    +---------+---------------+---------+-----------+----------+--------------+  SFJ       Full                                                             +---------+---------------+---------+-----------+----------+--------------+  FV Prox   Full                                                             +---------+---------------+---------+-----------+----------+--------------+  FV Mid    Full                                                             +---------+---------------+---------+-----------+----------+--------------+  FV Distal Full                                                             +---------+---------------+---------+-----------+----------+--------------+  PFV       Full                                                              +---------+---------------+---------+-----------+----------+--------------+  POP       Full            Yes       Yes                                    +---------+---------------+---------+-----------+----------+--------------+  PTV       Full                                                             +---------+---------------+---------+-----------+----------+--------------+  PERO      Full                                                             +---------+---------------+---------+-----------+----------+--------------+     Summary: Right: No evidence of common femoral vein obstruction. Left: There is no evidence of deep vein thrombosis in the lower extremity.  *See table(s) above for measurements and observations.    Preliminary    Procedures .Ortho Injury Treatment  Date/Time: 07/17/2019 4:44 PM Performed by: Ralph Leyden A, PA-C Authorized by: Linwood Dibbles, PA-C   Consent:    Consent obtained:  Verbal   Consent given by:  Patient   Risks discussed:  Fracture, nerve damage, restricted joint movement, vascular damage, recurrent dislocation, irreducible dislocation and stiffness   Alternatives discussed:  Referral, immobilization, alternative treatment, no treatment and delayed treatmentInjury location: ankle Location details: left ankle Injury type: fracture Fracture type: medial malleolus Pre-procedure neurovascular assessment: neurovascularly intact Pre-procedure distal perfusion: normal Pre-procedure neurological function: normal Pre-procedure range of motion: normal  Anesthesia: Local anesthesia used: no  Patient sedated: NoManipulation performed: no Immobilization: cast Splint type: Air cast. Post-procedure neurovascular assessment: post-procedure neurovascularly intact Post-procedure distal perfusion: normal Post-procedure neurological function: normal Post-procedure range of motion: normal Patient tolerance:  patient tolerated the procedure well with no immediate complications    (including critical care time)  Medications Ordered in ED Medications  oxyCODONE (Oxy IR/ROXICODONE) immediate release tablet 5 mg (5 mg Oral Given 07/17/19 1458)   Initial Impression / Assessment and Plan / ED Course  I have reviewed the triage vital signs and the nursing notes.  Pertinent labs & imaging results that were available during my care of the patient were reviewed by me and considered in my medical decision making (see chart for details).  53 year old female appears otherwise well presents for evaluation of left lower extremity pain.  She did have a left medial malleolus fracture 2 months ago.  Unfortunately she has had chronic pain since the fall.  She states she has been followed outpatient by orthopedics as well as seen in the emergency department since her original accident.  She does have a history of chronic pain which I feel likely this is a degree of.  She has greater than 20 medication allergies of which most are pain medicines.  She does have an overdose score of 800 list and the PMP aware database.  She did recently have oxycodone filled for her chronic pain from an ED less than 1 month ago.  Patient seen ambulating in ED without difficulty.  Plain film x-rays show well healing medial malleolus fracture to left extremity.  Ultrasound negative for DVT.  Her compartments are soft.  She has normal musculoskeletal exam.  She is neurovascularly intact.  She has good skin perfusion.  I have low suspicion for septic joint, gout, hemarthrosis, compartment syndrome, occult fracture, dislocation, osteomyelitis, gangrene, acute muscle or tendon rupture, vascular injury.  Patient likely with acute on chronic pain.  RICE for symptomatic management.  Will place in Aircast for support.  She appears overall well and has been ambulating since her arrival in the ED.  She is to follow-up with orthopedics which I referred her to  at discharge. Requesting additional pain medicine at discharge.  Will DC home with lidocaine patches and #2 Percocets.  Discussed with patient she needs to follow-up with pain management given she has chronic pain and has been seen multiple times for this.  The patient has been appropriately medically screened and/or stabilized in the ED. I have low suspicion for any other emergent medical condition which would require further screening, evaluation or treatment in the ED or require inpatient management.  1640: ADDEND: Did write her prescriptions to the wrong pharmacy.  I have called the original pharmacy and canceled her prescription with the pharmacist.        Final Clinical Impressions(s) / ED Diagnoses   Final diagnoses:  Nondisplaced fracture of medial malleolus of left tibia, subsequent encounter for closed fracture with routine healing    ED Discharge Orders         Ordered    lidocaine (LIDODERM) 5 %  Every 24 hours     07/17/19 1614    oxyCODONE-acetaminophen (PERCOCET/ROXICET) 5-325 MG tablet  Every 4 hours PRN     07/17/19 1619    oxyCODONE-acetaminophen (PERCOCET/ROXICET) 5-325 MG tablet  Every 4 hours PRN     07/17/19 1633    lidocaine (LIDODERM) 5 %  Every 24 hours     07/17/19 1633           Anmol Fleck A, PA-C 07/17/19 1642    Clotilde Loth A, PA-C 07/17/19 1643    Laythan Hayter A, PA-C 07/17/19 1645    Tegeler, Canary Brim, MD 07/18/19 0830

## 2019-07-17 NOTE — Progress Notes (Signed)
Orthopedic Tech Progress Note Patient Details:  Stephanie [name] 02-15-1966 [id number]  Ortho Devices Type of Ortho Device: Ankle Air splint Ortho Device/Splint Location: LLE Ortho Device/Splint Interventions: Adjustment, Application, Ordered   Post Interventions Patient Tolerated: Well Instructions Provided: Care of device, Adjustment of device   [name] 07/17/2019, 5:06 PM

## 2019-07-17 NOTE — Discharge Instructions (Addendum)
Lidocaine pain patches as prescribed.  Follow-up with orthopedics. You do not need any narcotic pain medications.

## 2019-07-17 NOTE — Progress Notes (Signed)
VASCULAR LAB PRELIMINARY  PRELIMINARY  PRELIMINARY  PRELIMINARY  Left lower extremity venous duplex completed.    Preliminary report:  See CV proc for preliminary results.  Gave report to [employer], PA-C  Gorgeous [place]lun, RVT 07/17/2019, 4:24 PM

## 2019-07-17 NOTE — ED Triage Notes (Signed)
Pt arrives to ED from home with complaints of on-going pain in her left foot since she sustained a left medial malleolus fracture on 9/11. Patient states that the pain has gotten to the point where she cannot eat. Dorsalis pedis pulse is palpable 2+. Patient denies any other complaints at this time.

## 2019-07-21 ENCOUNTER — Ambulatory Visit (INDEPENDENT_AMBULATORY_CARE_PROVIDER_SITE_OTHER): Payer: Medicaid Other

## 2019-07-21 ENCOUNTER — Ambulatory Visit (INDEPENDENT_AMBULATORY_CARE_PROVIDER_SITE_OTHER): Payer: Medicaid Other | Admitting: Orthopedic Surgery

## 2019-07-21 ENCOUNTER — Other Ambulatory Visit: Payer: Self-pay

## 2019-07-21 DIAGNOSIS — S8255XA Nondisplaced fracture of medial malleolus of left tibia, initial encounter for closed fracture: Secondary | ICD-10-CM

## 2019-07-21 DIAGNOSIS — M898X6 Other specified disorders of bone, lower leg: Secondary | ICD-10-CM

## 2019-07-21 MED ORDER — CYCLOBENZAPRINE HCL 10 MG PO TABS: 10.0000 mg | ORAL_TABLET | Freq: Three times a day (TID) | ORAL | 0 refills | Status: DC | PRN

## 2019-07-23 ENCOUNTER — Encounter: Payer: Self-pay | Admitting: Orthopedic Surgery

## 2019-07-23 NOTE — Progress Notes (Signed)
Office Visit Note   Patient: Stephanie [name]           Date of Birth: 15-Mar-1966           MRN: [id number] Visit Date: 07/21/2019 Requested by: Fayrene Helper, NP [address] [place],  [place] [address] PCP: Fayrene Helper, NP  Subjective: Chief Complaint  Patient presents with  . Left Ankle - Pain  . Left Foot - Pain    HPI: Stephanie [name] is a 53 y.o. female who presents to the office complaining of left ankle pain.  Patient notes that she fractured her left ankle 2 months ago after a ground-level fall.  She was seen at [facility] where she was immobilized in a boot.  Patient states that the boot was irritating so she only wore it for 1 week.  Recently she was seen at the ED at [facility] 4 days ago for ongoing left ankle pain.  Currently she is ambulating full weightbearing in flip-flops.  She prefers walking in flip-flops or bare feet.  She has been taking Percocet for pain but states that this causes itching.  She notes dizziness with walking which she states is led to several of her falls.  She has history of CVA 3 years ago as well as 1 year ago with multiple falls since these events.  sHe has follow-up with Dr. Clelia Croft in neurology.  Patient is a smoker.              ROS:  All systems reviewed are negative as they relate to the chief complaint within the history of present illness.  Patient denies fevers or chills.  Assessment & Plan: Visit Diagnoses:  1. Nondisplaced fracture of medial malleolus of left tibia, initial encounter for closed fracture     Plan: Patient is a 53 year old female who presents 2 months out from left ankle fracture.  X-rays today reveal well healed medial malleolus fracture and no definite lateral malleolus fracture.  She still has some tenderness over the malleoli but they appear to be healing well radiographically.  Patient was taken to fluoroscopy for syndesmotic testing.  Her syndesmosis was found to be stable.  Impression is post fracture  pain.  Recommended that patient use ankle brace for stability which may improve her pain; she was provided with one today in the office.  Prescribed Flexeril for muscle spasms.  Patient will follow-up as needed.  Pain prescriptions requested but none given today.  Follow-Up Instructions: No follow-ups on file.   Orders:  Orders Placed This Encounter  Procedures  . XR Tibia/Fibula Left   Meds ordered this encounter  Medications  . cyclobenzaprine (FLEXERIL) 10 MG tablet    Sig: Take 1 tablet (10 mg total) by mouth 3 (three) times daily as needed for muscle spasms.    Dispense:  30 tablet    Refill:  0      Procedures: No procedures performed   Clinical Data: No additional findings.  Objective: Vital Signs: There were no vitals taken for this visit.  Physical Exam:  Constitutional: Patient appears well-developed HEENT:  Head: Normocephalic Eyes:EOM are normal Neck: Normal range of motion Cardiovascular: Normal rate Pulmonary/chest: Effort normal Neurologic: Patient is alert Skin: Skin is warm Psychiatric: Patient has normal mood and affect  Ortho Exam:  Left ankle exam Mild TTP over the medial malleolus.  Moderate TTP over the lateral malleolus.  Syndesmosis is stable on exam with very small amount of laxity compared with the  contralateral side Dorsiflexion and plantar flexion are intact Achilles tendon intact.  No TTP at the fifth metatarsal base, myofascia, retrocalcaneal space, forefoot, midfoot No TTP at the fibular head or along the proximal shaft of the fibula.  No pain with syndesmotic squeeze  Specialty Comments:  No specialty comments available.  Imaging: No results found.   PMFS History: Patient Active Problem List   Diagnosis Date Noted  . Hypotension 08/22/2018  . Bradycardia 08/22/2018  . Type 2 diabetes mellitus ([place]) 08/22/2018  . Acute encephalopathy 08/21/2018  . Shoulder pain, right 12/22/2017  . UTI (urinary tract infection) 08/21/2017   . Nausea & vomiting 08/21/2017  . Nicotine dependence 08/21/2017  . Headache 01/07/2017  . Small bowel obstruction ([place]) 09/02/2016  . Pain and swelling of left upper extremity 07/22/2016  . Superficial venous thrombosis of arm, left 07/22/2016  . ICH (intracerebral hemorrhage) ([place])   . Essential hypertension, malignant 04/19/2016  . Cytotoxic brain edema ([place]) 04/19/2016  . IVH (intraventricular hemorrhage) ([place]) 04/18/2016  . Hepatitis C    Past Medical History:  Diagnosis Date  . Diabetes mellitus without complication ([place])   . Difficult intubation   . Diverticulitis   . Headache   . Hepatitis C    treated and resolved  . History of kidney stones   . Hypertension   . IBS (irritable bowel syndrome)   . Kidney stones   . Sciatica   . Sleep apnea    on cpap  . Stroke [facility]) 2017   memory loss    Family History  Problem Relation Age of Onset  . Heart failure Mother   . Diabetes Father   . Cancer Sister     Past Surgical History:  Procedure Laterality Date  . ABDOMINAL HYSTERECTOMY    . carpel tunnel syndrome  2017  . HARDWARE REMOVAL Right 12/22/2017   Procedure: HARDWARE REMOVAL-RIGHT ARM;  Surgeon: Lovell Sheehan, MD;  Location: [facility] ORS;  Service: Orthopedics;  Laterality: Right;  Removal of implant, superficial right humerus  . HUMERUS IM NAIL Right 03/11/2017   Procedure: INTRAMEDULLARY (IM) NAIL HUMERAL;  Surgeon: Lovell Sheehan, MD;  Location: [facility] ORS;  Service: Orthopedics;  Laterality: Right;  . KIDNEY STONE SURGERY Right 02/12/12  . KIDNEY STONE SURGERY Left 07/15/2012  . LIGATION OF ARTERIOVENOUS  FISTULA Left 06/21/2016   Procedure: LIGATION OF ARTERIOVENOUS  FISTULA ( LIGATION BASILIC VEIN );  Surgeon: Katha Cabal, MD;  Location: [facility] ORS;  Service: Vascular;  Laterality: Left;  . LITHOTRIPSY    . OOPHORECTOMY    . SINUS EXPLORATION    . TONSILLECTOMY     Social History   Occupational History  . Not on file  Tobacco Use  . Smoking status:  Current Every Day Smoker    Packs/day: 1.00    Years: 1.50    Pack years: 1.50    Types: Cigarettes  . Smokeless tobacco: Never Used  Substance and Sexual Activity  . Alcohol use: Not Currently    Comment: no alcohol since 2002  . Drug use: No    Comment: electric cig  . Sexual activity: Not on file

## 2019-08-05 IMAGING — NM NM GASTRIC EMPTYING
1 series · 10 of 10 positions shown · non-contrast
Comparison: None.

CLINICAL DATA: Chronic abdominal pain and nausea.

EXAM:
NUCLEAR MEDICINE GASTRIC EMPTYING SCAN
TECHNIQUE: After oral ingestion of radiolabeled meal, sequential abdominal
images were obtained for 4 hours. Percentage of activity emptying
the stomach was calculated at 1 hour, 2 hour, 3 hour, and 4 hours.
RADIOPHARMACEUTICALS:  2.2 mCi 1c-TTm sulfur colloid in standardized
meal

[Series 1000: gatric statics (results) · 3.90mm/px · 5 acquisitions, 10 frames shown]
[im 1/5]
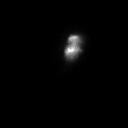
[im 1/5]
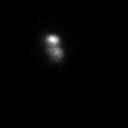
[im 2/5]
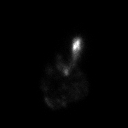
[im 2/5]
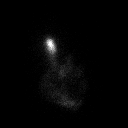
[im 3/5]
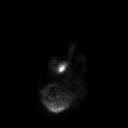
[im 3/5]
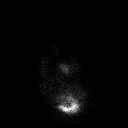
[im 4/5]
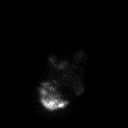
[im 4/5]
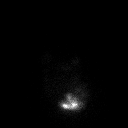
[im 5/5]
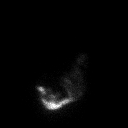
[im 5/5]
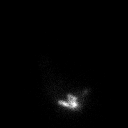

[10 of 10 positions shown; findings below may reference images not displayed]

FINDINGS: Expected location of the stomach in the left upper quadrant.
Ingested meal empties the stomach gradually over the course of the
study.

45% emptied at 1 hr ( normal >= 10%)

84% emptied at 2 hr ( normal >= 40%)

95% emptied at 3 hr ( normal >= 70%)

99% emptied at 4 hr ( normal >= 90%)
IMPRESSION: Normal gastric emptying study.

## 2019-08-18 ENCOUNTER — Ambulatory Visit: Payer: Medicaid Other | Admitting: Orthopedic Surgery

## 2019-08-20 ENCOUNTER — Other Ambulatory Visit: Payer: Self-pay | Admitting: Surgical

## 2019-08-20 ENCOUNTER — Emergency Department (HOSPITAL_COMMUNITY)
Admission: EM | Admit: 2019-08-20 | Discharge: 2019-08-20 | Disposition: A | Discharge status: Home / Self Care | Payer: Medicaid Other | Attending: Emergency Medicine | Admitting: Emergency Medicine

## 2019-08-20 ENCOUNTER — Encounter (HOSPITAL_COMMUNITY): Payer: Self-pay | Admitting: *Deleted

## 2019-08-20 ENCOUNTER — Other Ambulatory Visit: Payer: Self-pay

## 2019-08-20 DIAGNOSIS — Z79899 Other long term (current) drug therapy: Secondary | ICD-10-CM | POA: Diagnosis not present

## 2019-08-20 DIAGNOSIS — E119 Type 2 diabetes mellitus without complications: Secondary | ICD-10-CM | POA: Diagnosis not present

## 2019-08-20 DIAGNOSIS — F17210 Nicotine dependence, cigarettes, uncomplicated: Secondary | ICD-10-CM | POA: Insufficient documentation

## 2019-08-20 DIAGNOSIS — I10 Essential (primary) hypertension: Secondary | ICD-10-CM | POA: Insufficient documentation

## 2019-08-20 DIAGNOSIS — Z7984 Long term (current) use of oral hypoglycemic drugs: Secondary | ICD-10-CM | POA: Diagnosis not present

## 2019-08-20 DIAGNOSIS — Z8673 Personal history of transient ischemic attack (TIA), and cerebral infarction without residual deficits: Secondary | ICD-10-CM | POA: Diagnosis not present

## 2019-08-20 DIAGNOSIS — G43909 Migraine, unspecified, not intractable, without status migrainosus: Secondary | ICD-10-CM | POA: Diagnosis not present

## 2019-08-20 DIAGNOSIS — R519 Headache, unspecified: Secondary | ICD-10-CM | POA: Diagnosis present

## 2019-08-20 MED ORDER — HALOPERIDOL LACTATE 5 MG/ML IJ SOLN
2.5000 mg | Freq: Once | INTRAMUSCULAR | Status: AC
  Administered 2019-08-20: 21:00:00 2.5 mg via INTRAMUSCULAR
  Filled 2019-08-20: qty 1

## 2019-08-20 NOTE — ED Notes (Signed)
Pt states she has had a headache since last Sunday. Pt states it is associated with small black dots in her visual field. Pt denies nausea or vomiting. Pt denies anything making it better or worse. When asked time frame of last eye exam for glasses pt states she just received prescription lenses  About a month and a half ago.

## 2019-08-20 NOTE — Discharge Instructions (Addendum)
Please return for any problem.  Follow-up with your regular care provider as instructed. °

## 2019-08-20 NOTE — ED Provider Notes (Signed)
[facility]-EMERGENCY DEPT Provider Note   CSN: [id number] Arrival date & time: 08/20/19  1550     History Chief Complaint  Patient presents with  . Headache  . Back Pain    Stephanie [name] is a 53 y.o. female.  53 year old female with prior medical history as detailed below presents for evaluation of migraine headache.  Patient reports onset of migraine headache approximately 1 week prior.  She reports persistent headache.  This is consistent with her normal migraine.  She denies associated fever.  She denies neck stiffness.  She denies visual change or focal weakness.  She does specifically request a shot of Dilaudid for her pain.  Chart review reveals significant multiple prior evaluations with similar symptoms.  There has been a concern for possible drug-seeking behavior in the past.  Patient with multiple listed allergies including to most commonly used antimigraine medications.  The history is provided by the patient and medical records.  Headache Pain location:  Generalized Quality:  Dull Radiates to:  Does not radiate Onset quality:  Gradual Duration:  6 days Timing:  Constant Progression:  Waxing and waning Chronicity:  New Relieved by:  Nothing Worsened by:  Nothing Associated symptoms: back pain   Associated symptoms: no fever   Back Pain Associated symptoms: headaches   Associated symptoms: no fever        Past Medical History:  Diagnosis Date  . Diabetes mellitus without complication ([facility])   . Difficult intubation   . Diverticulitis   . Headache   . Hepatitis C    treated and resolved  . History of kidney stones   . Hypertension   . IBS (irritable bowel syndrome)   . Kidney stones   . Sciatica   . Sleep apnea    on cpap  . Stroke [facility]) 2017   memory loss    Patient Active Problem List   Diagnosis Date Noted  . Hypotension 08/22/2018  . Bradycardia 08/22/2018  . Type 2 diabetes mellitus ([facility]) 08/22/2018  . Acute  encephalopathy 08/21/2018  . Shoulder pain, right 12/22/2017  . UTI (urinary tract infection) 08/21/2017  . Nausea & vomiting 08/21/2017  . Nicotine dependence 08/21/2017  . Headache 01/07/2017  . Small bowel obstruction ([facility]) 09/02/2016  . Pain and swelling of left upper extremity 07/22/2016  . Superficial venous thrombosis of arm, left 07/22/2016  . ICH (intracerebral hemorrhage) ([facility])   . Essential hypertension, malignant 04/19/2016  . Cytotoxic brain edema ([facility]) 04/19/2016  . IVH (intraventricular hemorrhage) ([facility]) 04/18/2016  . Hepatitis C     Past Surgical History:  Procedure Laterality Date  . ABDOMINAL HYSTERECTOMY    . carpel tunnel syndrome  2017  . HARDWARE REMOVAL Right 12/22/2017   Procedure: HARDWARE REMOVAL-RIGHT ARM;  Surgeon: Lyndle HerrlichBowers, James R, MD;  Location: [facility] ORS;  Service: Orthopedics;  Laterality: Right;  Removal of implant, superficial right humerus  . HUMERUS IM NAIL Right 03/11/2017   Procedure: INTRAMEDULLARY (IM) NAIL HUMERAL;  Surgeon: Lyndle HerrlichBowers, James R, MD;  Location: [facility] ORS;  Service: Orthopedics;  Laterality: Right;  . KIDNEY STONE SURGERY Right 02/12/12  . KIDNEY STONE SURGERY Left 07/15/2012  . LIGATION OF ARTERIOVENOUS  FISTULA Left 06/21/2016   Procedure: LIGATION OF ARTERIOVENOUS  FISTULA ( LIGATION BASILIC VEIN );  Surgeon: Renford DillsGregory G Schnier, MD;  Location: [facility] ORS;  Service: Vascular;  Laterality: Left;  . LITHOTRIPSY    . OOPHORECTOMY    . SINUS EXPLORATION    . TONSILLECTOMY  OB History   No obstetric history on file.     Family History  Problem Relation Age of Onset  . Heart failure Mother   . Diabetes Father   . Cancer Sister     Social History   Tobacco Use  . Smoking status: Current Every Day Smoker    Packs/day: 1.00    Years: 1.50    Pack years: 1.50    Types: Cigarettes  . Smokeless tobacco: Never Used  Substance Use Topics  . Alcohol use: Not Currently    Comment: no alcohol since 2002  . Drug use: No     Comment: electric cig    Home Medications Prior to Admission medications   Medication Sig Start Date End Date Taking? Authorizing Provider  budesonide-formoterol (SYMBICORT) 160-4.5 MCG/ACT inhaler Inhale 2 puffs into the lungs 2 (two) times daily.    [provider]  cephALEXin (KEFLEX) 500 MG capsule Take 1 capsule (500 mg total) by mouth 3 (three) times daily. Patient not taking: Reported on 05/17/2019 04/21/19   Horton, Mayer Masker, MD  ciprofloxacin (CIPRO) 500 MG tablet Take 500 mg by mouth 2 (two) times daily.    [provider]  cloNIDine (CATAPRES) 0.2 MG tablet Take 0.2 mg by mouth 2 (two) times daily. 10/02/18   [provider]  clopidogrel (PLAVIX) 75 MG tablet Take 1 tablet (75 mg total) by mouth daily. Patient not taking: Reported on 05/17/2019 06/27/16   Marvel Plan, MD  cyclobenzaprine (FLEXERIL) 10 MG tablet Take 1 tablet by mouth three times daily as needed for muscle spasm 08/20/19   Magnant, Joycie Peek, PA-C  etodolac (LODINE) 300 MG capsule Take 1 capsule (300 mg total) by mouth 3 (three) times daily as needed (pain). Patient not taking: Reported on 03/07/2019 01/16/19   Linwood Dibbles, MD  FLUoxetine (PROZAC) 40 MG capsule Take 40 mg by mouth daily.  06/28/18   [provider]  glipiZIDE (GLUCOTROL) 5 MG tablet Take 1 tablet (5 mg total) by mouth 2 (two) times daily before a meal. 04/30/16   Lora Paula, MD  hydrALAZINE (APRESOLINE) 50 MG tablet Take 50 mg by mouth 3 (three) times daily. 12/08/18   [provider]  lamoTRIgine (LAMICTAL) 25 MG tablet Take 25-50 mg by mouth 2 (two) times a day. Take 25 mg in the am and 50 mg at night. 02/12/19 03/13/20  [provider]  lidocaine (LIDODERM) 5 % Place 1 patch onto the skin daily. Remove & Discard patch within 12 hours or as directed by MD 07/17/19   Henderly, Britni A, PA-C  lidocaine (LIDODERM) 5 % Place 1 patch onto the skin daily. Remove & Discard patch within 12 hours or as directed  by MD 07/17/19   Henderly, Britni A, PA-C  loratadine (CLARITIN) 10 MG tablet Take 10 mg by mouth daily.    [provider]  losartan (COZAAR) 100 MG tablet Take 100 mg by mouth daily. 10/02/18   [provider]  methylPREDNISolone (MEDROL DOSEPAK) 4 MG TBPK tablet Take as directed on packet Patient not taking: Reported on 05/17/2019 04/21/19   Horton, Mayer Masker, MD  oxyCODONE (ROXICODONE) 5 MG immediate release tablet Take 1 tablet (5 mg total) by mouth every 4 (four) hours as needed for severe pain. 05/21/19   Lawyer, Cristal Deer, PA-C  oxyCODONE-acetaminophen (PERCOCET/ROXICET) 5-325 MG tablet Take 1 tablet by mouth every 4 (four) hours as needed for severe pain. 07/17/19   Henderly, Britni A, PA-C  oxyCODONE-acetaminophen (PERCOCET/ROXICET) 5-325  MG tablet Take 1 tablet by mouth every 4 (four) hours as needed for severe pain. 07/17/19   Henderly, Britni A, PA-C  pantoprazole (PROTONIX) 40 MG tablet Take 40 mg by mouth daily.    [provider]  promethazine (PHENERGAN) 25 MG tablet Take 1 tablet (25 mg total) by mouth every 6 (six) hours as needed for nausea or vomiting. 04/21/19   Horton, Barbette Hair, MD  traZODone (DESYREL) 100 MG tablet Take 100 mg by mouth at bedtime.    [provider]    Allergies    Gabapentin, Ibuprofen, Tylenol [acetaminophen], Aspirin, Buprenorphine hcl, Codeine, Compazine, Ioxaglate, Ivp dye [iodinated diagnostic agents], Metrizamide, Naproxen, Norco [hydrocodone-acetaminophen], Penicillin g, Prochlorperazine maleate, Reglan [metoclopramide], Sulfur, Tegretol [carbamazepine], Toradol [ketorolac tromethamine], Tramadol, and Zofran [ondansetron hcl]  Review of Systems   Review of Systems  Constitutional: Negative for fever.  Musculoskeletal: Positive for back pain.  Neurological: Positive for headaches.  All other systems reviewed and are negative.   Physical Exam Updated Vital Signs BP 121/80 (BP Location: Right Arm)   Pulse 87    Temp 98.5 F (36.9 C) (Oral)   Resp 18   Ht 5\' 2"  (1.575 m)   Wt 81.6 kg   SpO2 96%   BMI 32.92 kg/m   Physical Exam Vitals and nursing note reviewed.  Constitutional:      General: She is not in acute distress.    Appearance: She is well-developed.  HENT:     Head: Normocephalic and atraumatic.  Eyes:     General: No visual field deficit.    Conjunctiva/sclera: Conjunctivae normal.     Pupils: Pupils are equal, round, and reactive to light.  Cardiovascular:     Rate and Rhythm: Normal rate and regular rhythm.     Heart sounds: Normal heart sounds.  Pulmonary:     Effort: Pulmonary effort is normal. No respiratory distress.     Breath sounds: Normal breath sounds.  Abdominal:     General: There is no distension.     Palpations: Abdomen is soft.     Tenderness: There is no abdominal tenderness.  Musculoskeletal:        General: No deformity. Normal range of motion.     Cervical back: Normal range of motion and neck supple.  Skin:    General: Skin is warm and dry.  Neurological:     Mental Status: She is alert and oriented to person, place, and time.     GCS: GCS eye subscore is 4. GCS verbal subscore is 5. GCS motor subscore is 6.     Cranial Nerves: No cranial nerve deficit, dysarthria or facial asymmetry.     ED Results / Procedures / Treatments   Labs (all labs ordered are listed, but only abnormal results are displayed) Labs Reviewed - No data to display  EKG None  Radiology No results found.  Procedures Procedures (including critical care time)  Medications Ordered in ED Medications  haloperidol lactate (HALDOL) injection 2.5 mg (has no administration in time range)    ED Course  I have reviewed the triage vital signs and the nursing notes.  Pertinent labs & imaging results that were available during my care of the patient were reviewed by me and considered in my medical decision making (see chart for details).    MDM  Rules/Calculators/A&P MDM  Screen complete  Stephanie [name] was evaluated in Emergency Department on 08/20/2019 for the symptoms described in the history of present illness. She was  evaluated in the context of the global COVID-19 pandemic, which necessitated consideration that the patient might be at risk for infection with the SARS-CoV-2 virus that causes COVID-19. Institutional protocols and algorithms that pertain to the evaluation of patients at risk for COVID-19 are in a state of rapid change based on information released by regulatory bodies including the CDC and federal and state organizations. These policies and algorithms were followed during the patient's care in the ED.  Patient is presenting for evaluation of reported migraine.  Patient with longstanding history of same.  Presentation today is not suggestive of other more significant acute pathology.  Of note, patient did request Dilaudid by name.  She does have a documented longstanding history of possible drug-seeking behavior.  Narcotics were avoided as a treatment option today.  Patient does feel improved following her ED evaluation and treatment.  She does understand the need for close follow-up.  Strict return precautions given and understood.  Final Clinical Impression(s) / ED Diagnoses Final diagnoses:  Migraine without status migrainosus, not intractable, unspecified migraine type    Rx / DC Orders ED Discharge Orders    None       Wynetta Fines, MD 08/20/19 2153

## 2019-08-20 NOTE — ED Triage Notes (Signed)
Multiple complaints, headache since Sunday, back and foot pain described as chronic

## 2019-09-10 ENCOUNTER — Other Ambulatory Visit: Payer: Self-pay | Admitting: Surgical

## 2019-09-14 ENCOUNTER — Other Ambulatory Visit: Payer: Self-pay | Admitting: Surgical

## 2019-09-14 NOTE — Telephone Encounter (Signed)
Ok to rf? 

## 2019-09-18 IMAGING — CT CT HEAD W/O CM
3 series · 16 of 47 positions shown, 19 images · non-contrast
Comparison: 08/16/2017

CLINICAL DATA: Headaches

EXAM:
CT HEAD WITHOUT CONTRAST
TECHNIQUE: Contiguous axial images were obtained from the base of the skull
through the vertex without intravenous contrast.

[Series 2: head wo · axial · 0.46mm/px · z∈[-163,-28]mm · 10 of 33 slices shown, 13 images]
[im 3/33  brain]
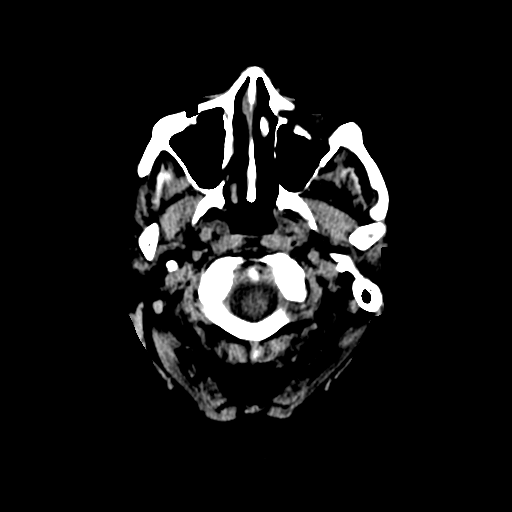
[im 3/33  bone]
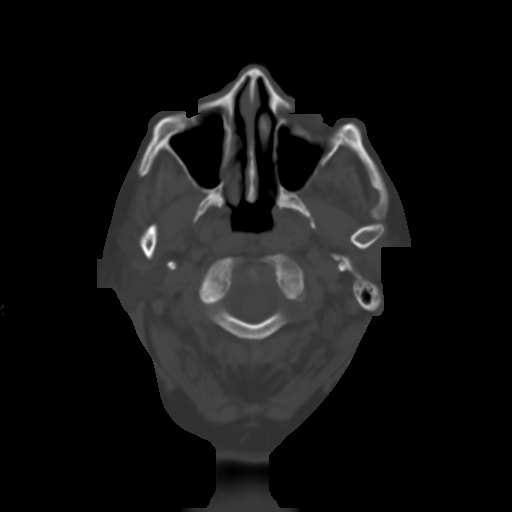
[im 6/33  brain]
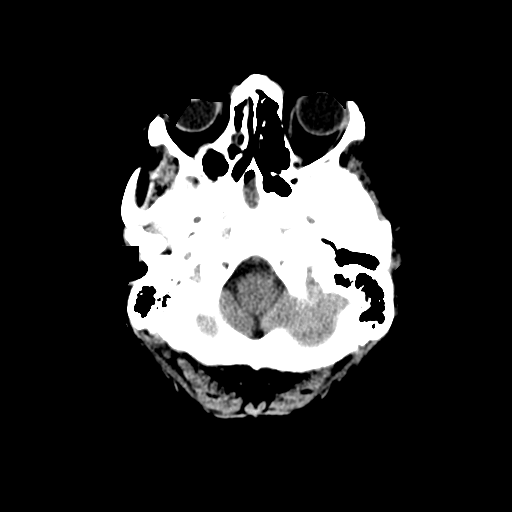
[im 9/33  brain]
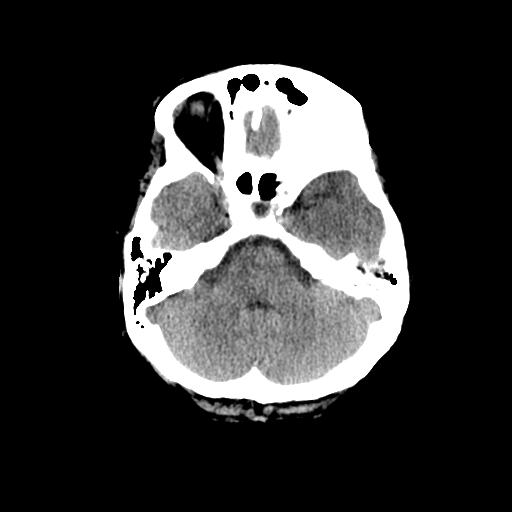
[im 12/33  brain]
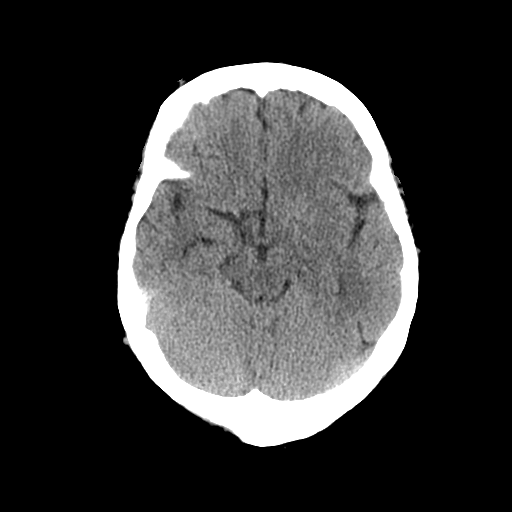
[im 15/33  brain]
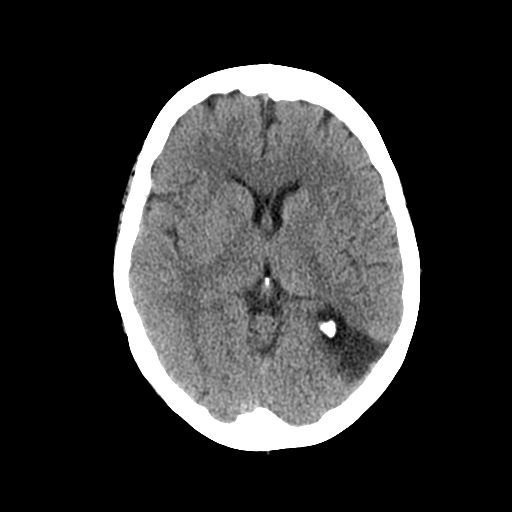
[im 15/33  bone]
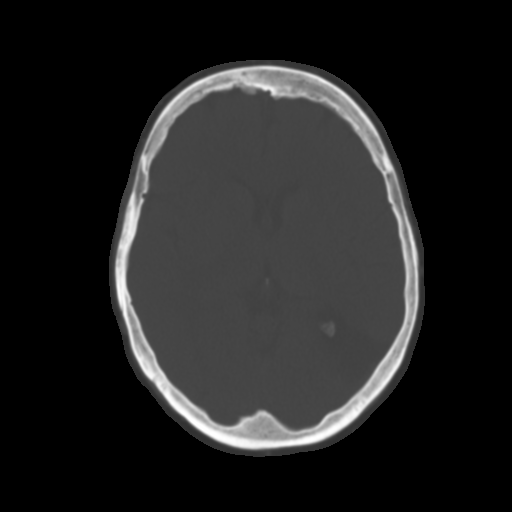
[im 18/33  brain]
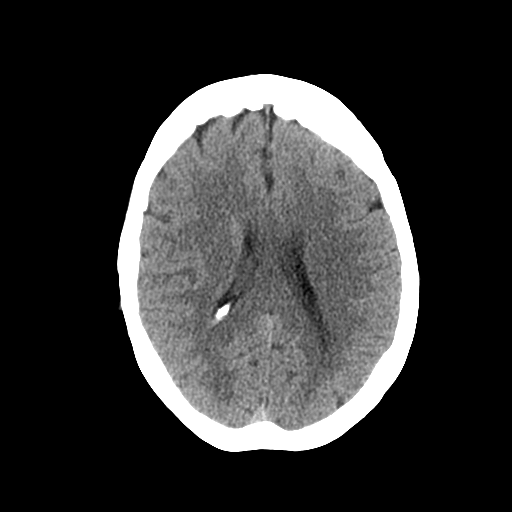
[im 21/33  brain]
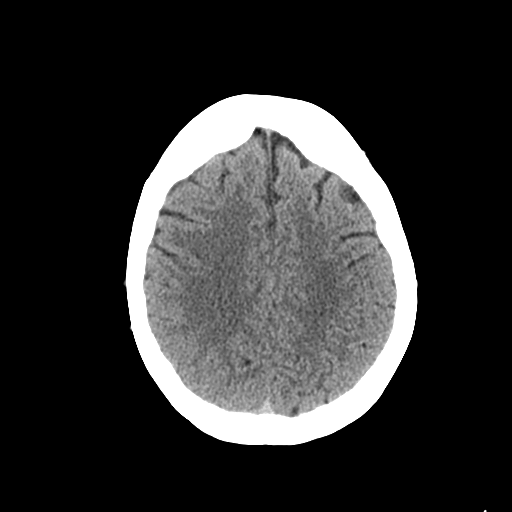
[im 25/33  brain]
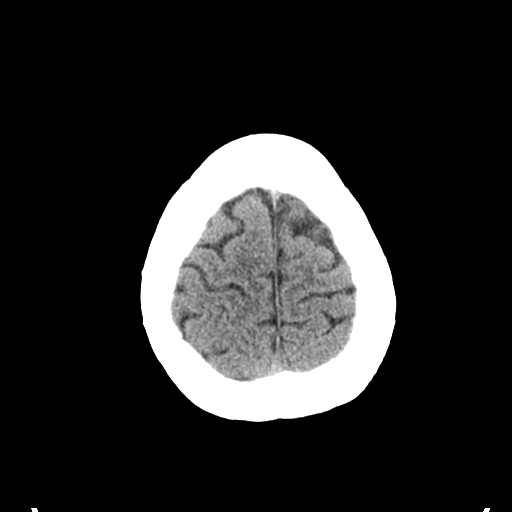
[im 27/33  brain]
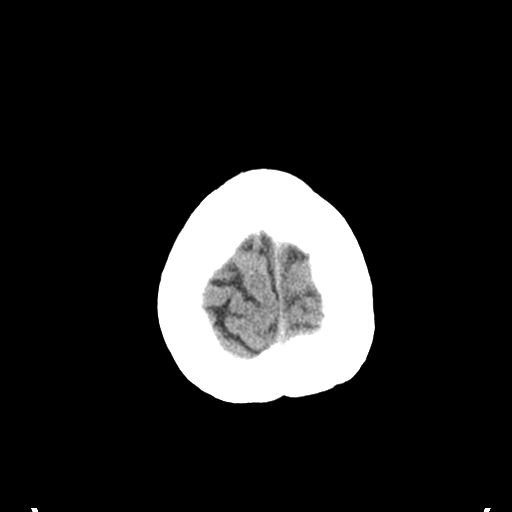
[im 27/33  bone]
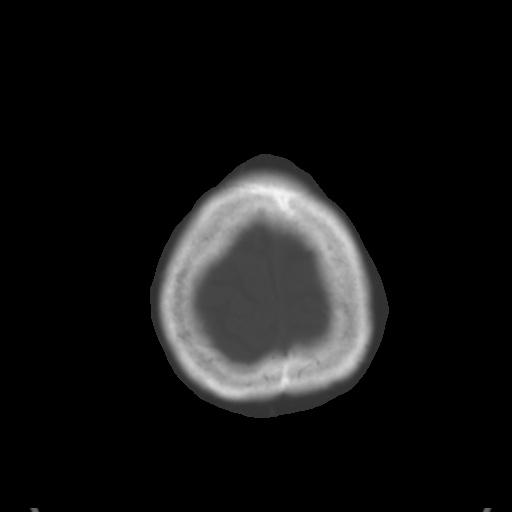
[im 30/33  brain]
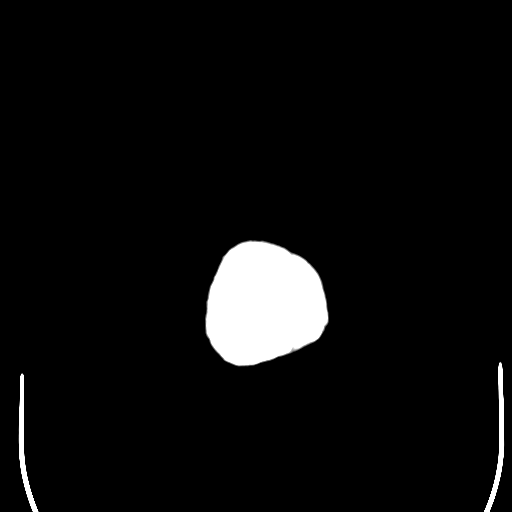

[Series 4: coronal soft tissue · coronal · 0.30mm/px · 3 of 65 slices shown]
[im 22/65  brain]
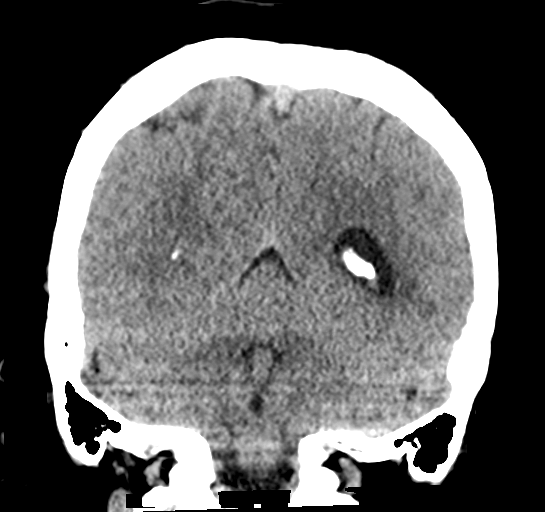
[im 29/65  brain]
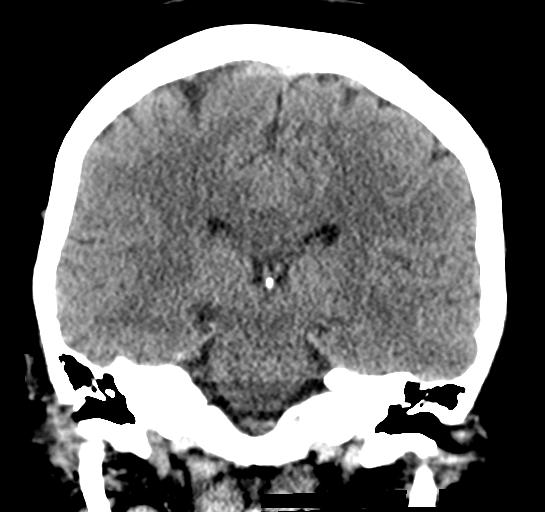
[im 36/65  brain]
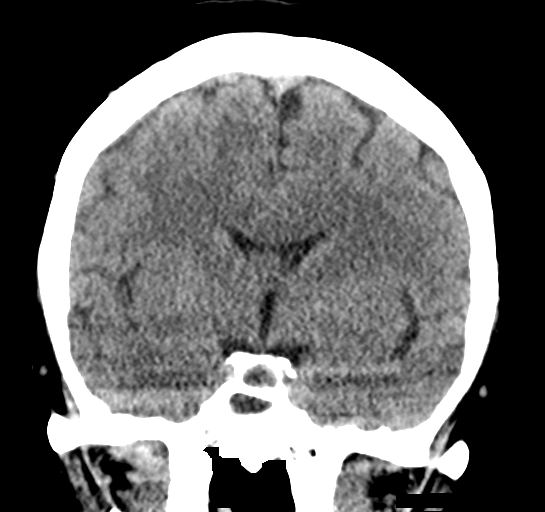

[Series 5: sagittal soft tissue · sagittal · 0.30mm/px · 3 of 55 slices shown]
[im 19/55  brain]
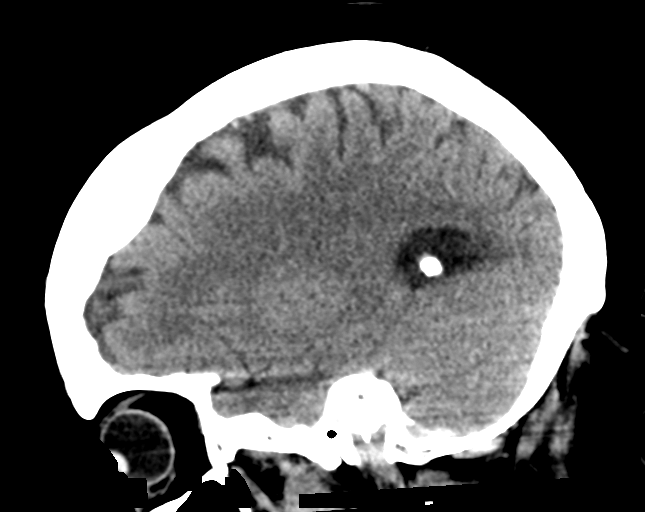
[im 28/55  brain]
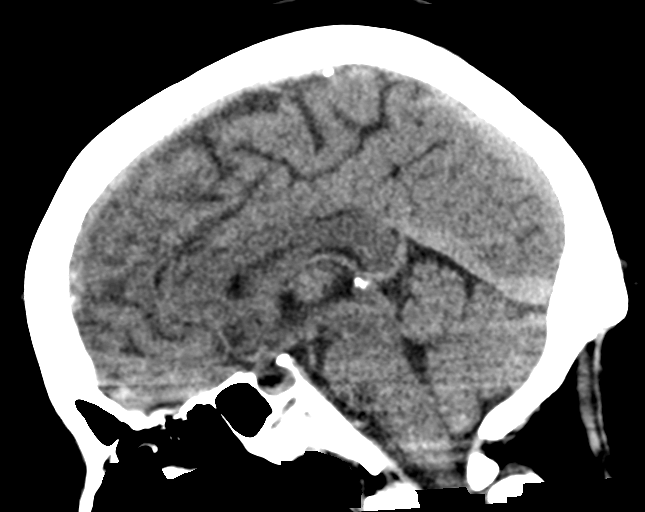
[im 37/55  brain]
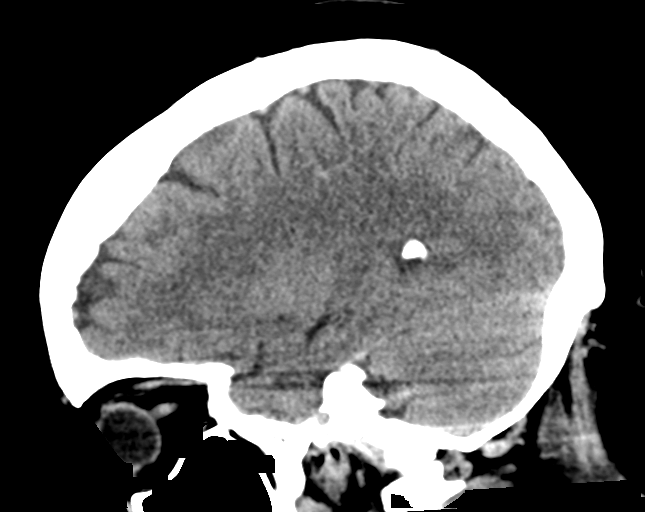

[16 of 47 positions shown; findings below may reference images not displayed]

FINDINGS: Brain: Old left parietoccipital infarct is noted. No acute
hemorrhage, acute infarction or space-occupying mass lesion is
noted.

Vascular: No hyperdense vessel or unexpected calcification.

Skull: Normal. Negative for fracture or focal lesion.

Sinuses/Orbits: Postsurgical changes are noted in the right
maxillary antrum. Mucosal thickening is noted within the sphenoid
sinus on the right.

Other: None.
IMPRESSION: Chronic changes without acute abnormality.

## 2019-09-18 IMAGING — CT CT RENAL STONE PROTOCOL
2 of 4 series · 16 of 46 positions shown, 18 images · non-contrast
Comparison: 07/25/2017

CLINICAL DATA: Mid abdominal pain since yesterday. Nausea vomiting
and diarrhea.

EXAM:
CT ABDOMEN AND PELVIS WITHOUT CONTRAST
TECHNIQUE: Multidetector CT imaging of the abdomen and pelvis was performed
following the standard protocol without IV contrast.

[Series 2: axial st · axial · 0.72mm/px · z∈[-874,-478]mm · 13 of 89 slices shown, 15 images]
[im 5/89  soft-tissue]
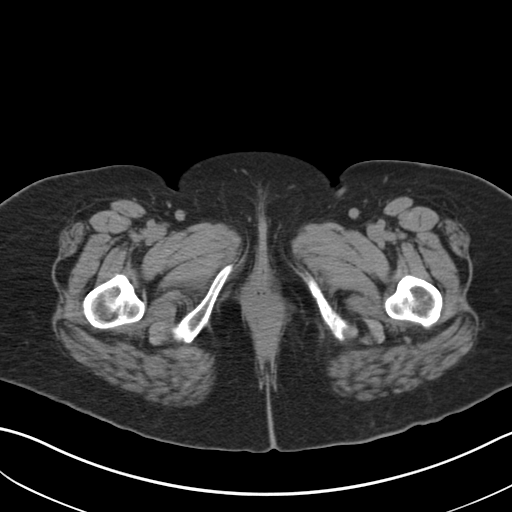
[im 5/89  bone]
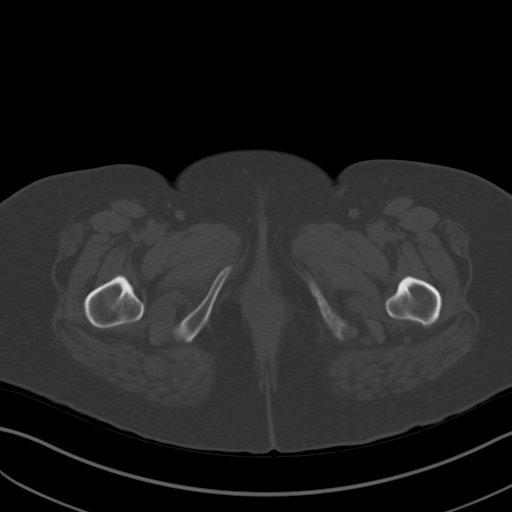
[im 14/89  soft-tissue]
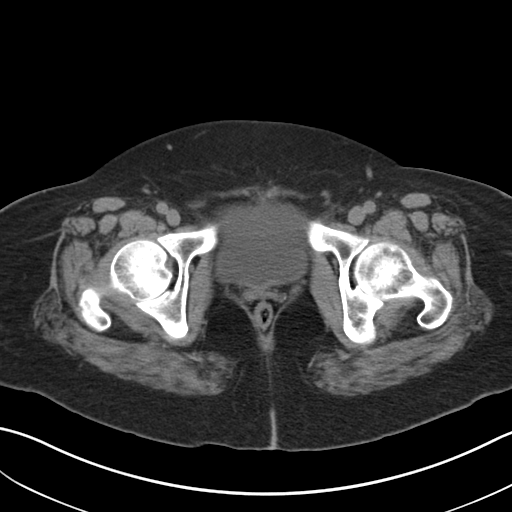
[im 18/89  soft-tissue]
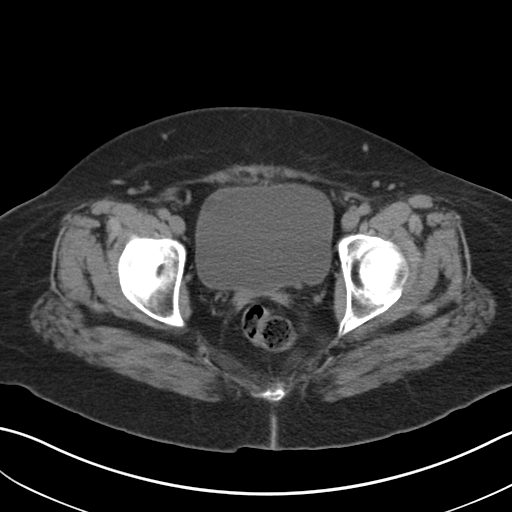
[im 27/89  soft-tissue]
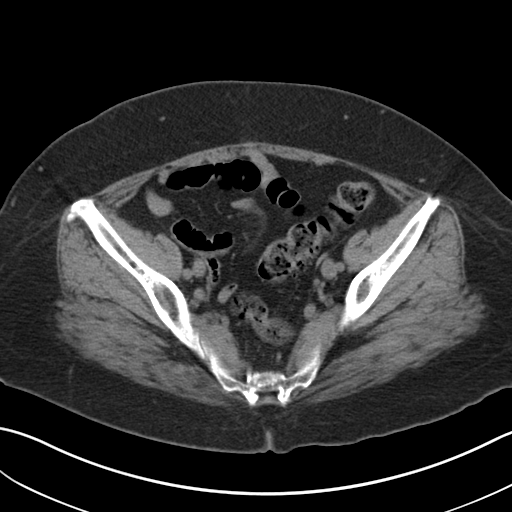
[im 31/89  soft-tissue]
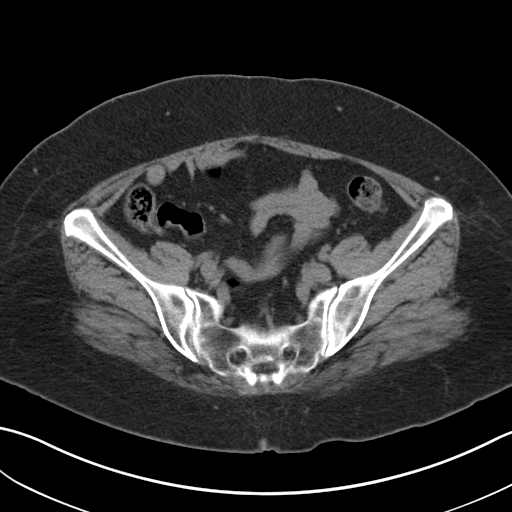
[im 40/89  soft-tissue]
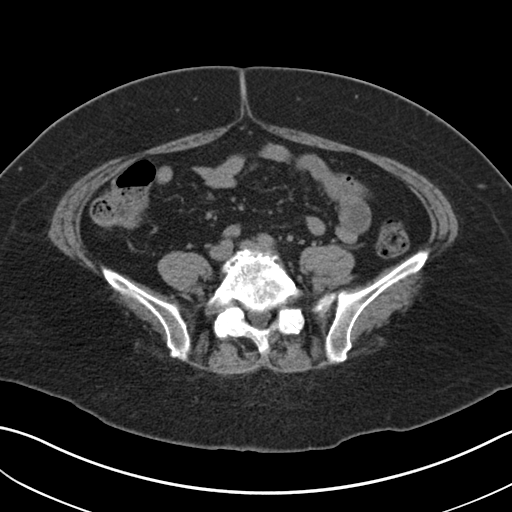
[im 45/89  soft-tissue]
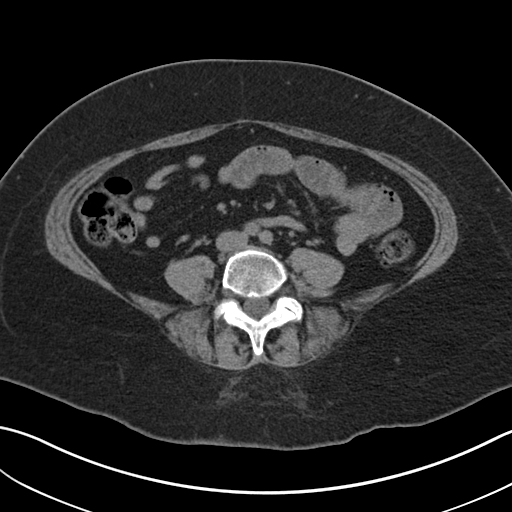
[im 49/89  soft-tissue]
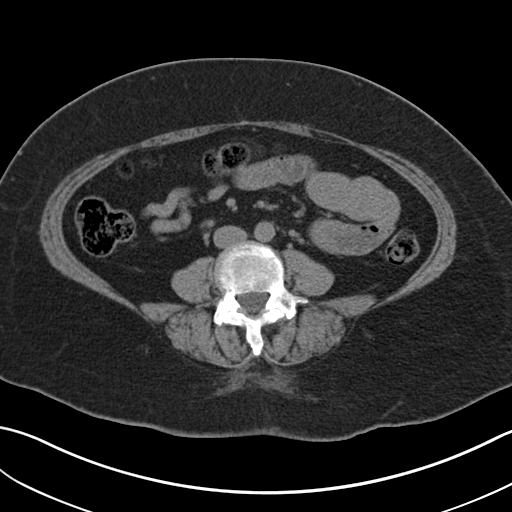
[im 58/89  soft-tissue]
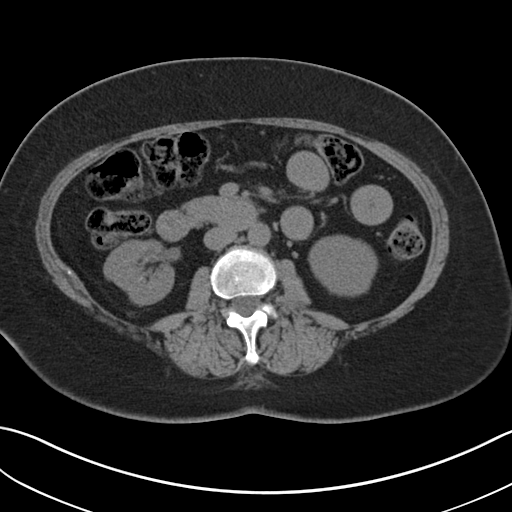
[im 58/89  bone]
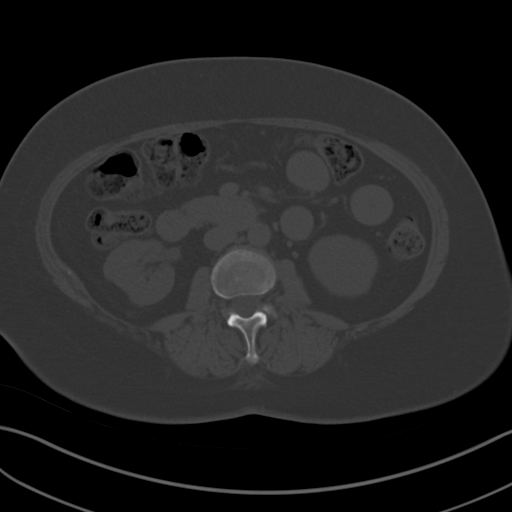
[im 62/89  soft-tissue]
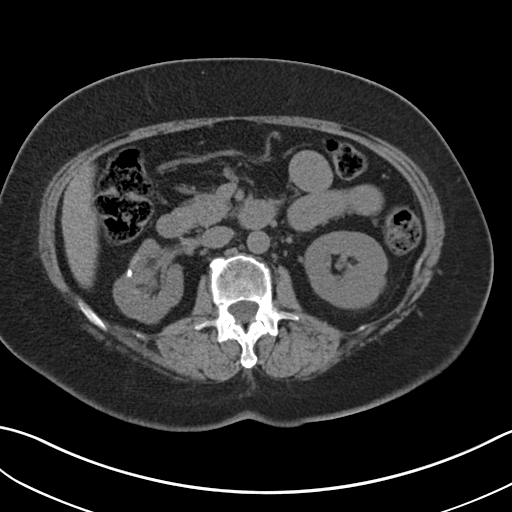
[im 71/89  soft-tissue]
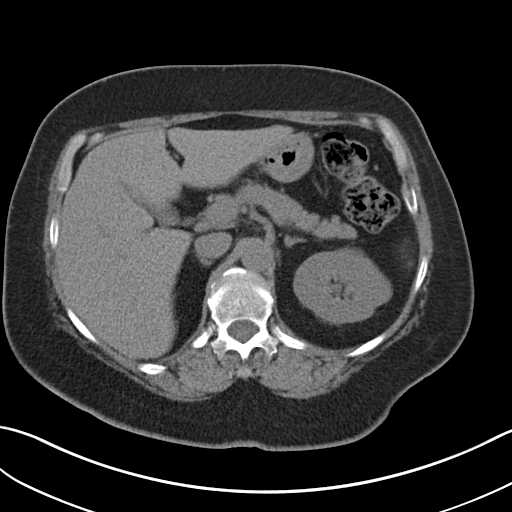
[im 75/89  soft-tissue]
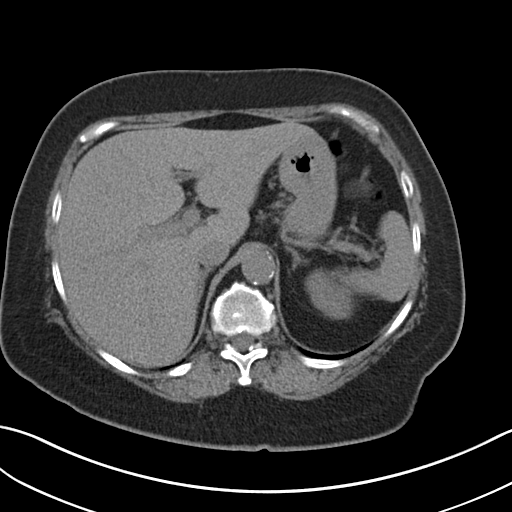
[im 84/89  soft-tissue]
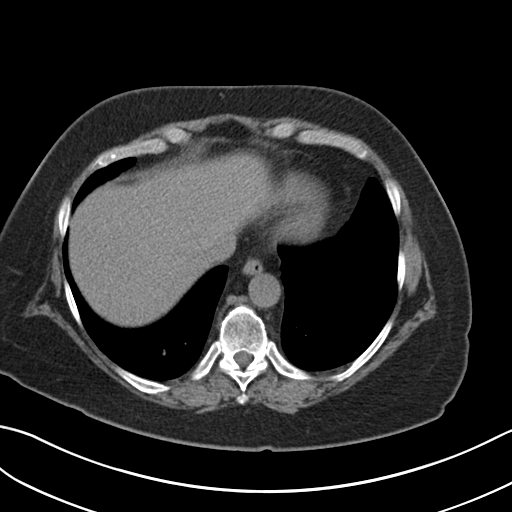

[Series 4: coronal · coronal · 0.84mm/px · 3 of 151 slices shown]
[im 51/151  soft-tissue]
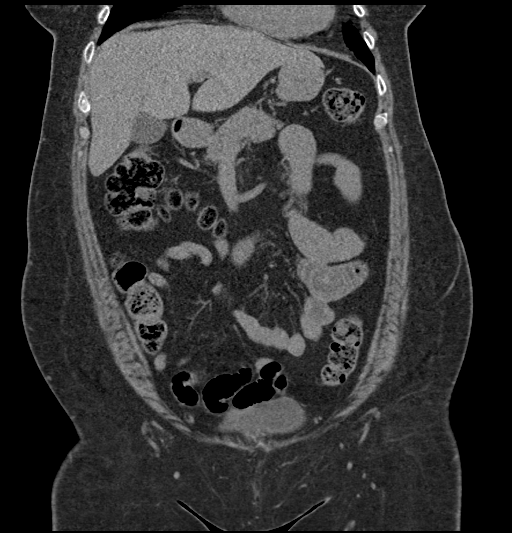
[im 67/151  soft-tissue]
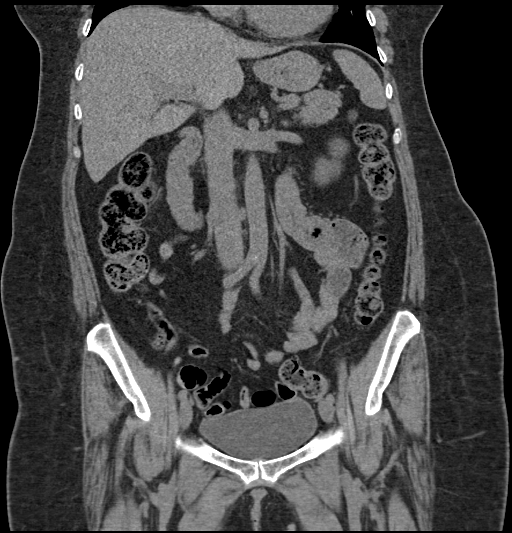
[im 84/151  soft-tissue]
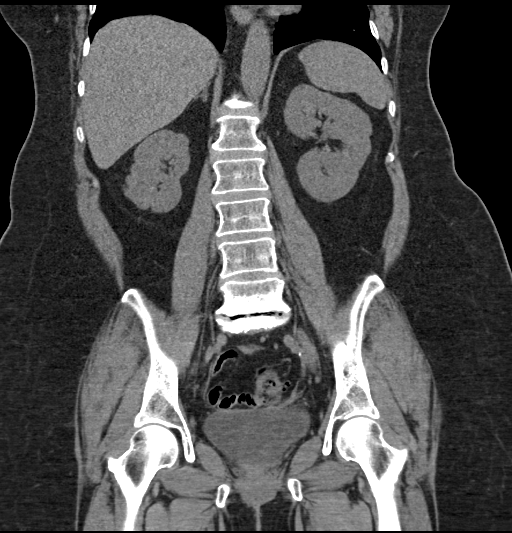

[16 of 46 positions shown; findings below may reference images not displayed]

FINDINGS: Lower chest:  Unremarkable.

Hepatobiliary: No focal abnormality in the liver on this study
without intravenous contrast. There is no evidence for gallstones,
gallbladder wall thickening, or pericholecystic fluid. No
intrahepatic or extrahepatic biliary dilation.

Pancreas: No focal mass lesion. No dilatation of the main duct. No
intraparenchymal cyst. No peripancreatic edema.

Spleen: No splenomegaly. No focal mass lesion.

Adrenals/Urinary Tract: Right adrenal gland unremarkable. Stable 11
mm left adrenal nodule compatible with adenoma. Bilateral renal
stones again noted. 3 mm nonobstructing stone seen upper pole left
kidney. No left ureteral stones. At least 7 tiny stones are seen
scattered in the right kidney with prominent areas of focal cortical
scarring. No right hydroureteronephrosis. No right ureteral stone.
No bladder stone.

Stomach/Bowel: Stomach is nondistended. No gastric wall thickening.
No evidence of outlet obstruction. Duodenum is normally positioned
as is the ligament of Treitz. No small bowel wall thickening. No
small bowel dilatation. The terminal ileum is normal. The appendix
is normal. Diverticular changes are noted in the left colon without
evidence of diverticulitis.

Vascular/Lymphatic: There is abdominal aortic atherosclerosis
without aneurysm. There is no gastrohepatic or hepatoduodenal
ligament lymphadenopathy. No intraperitoneal or retroperitoneal
lymphadenopathy. No pelvic sidewall lymphadenopathy.

Reproductive: Uterus surgically absent.  There is no adnexal mass.

Other: No intraperitoneal free fluid.

Musculoskeletal: Bone windows reveal no worrisome lytic or sclerotic
osseous lesions.
IMPRESSION: 1. Stable exam.  No acute findings in the abdomen or pelvis.
2. Bilateral nephrolithiasis, right greater than left. No
hydroureteronephrosis. No evidence for ureteral or bladder stones.
3. Right renal scarring.
4. Stable 11 mm left adrenal adenoma.

## 2019-10-12 ENCOUNTER — Other Ambulatory Visit: Payer: Self-pay | Admitting: Surgical

## 2019-10-12 NOTE — Telephone Encounter (Signed)
Ok to rf? 

## 2019-10-12 NOTE — Telephone Encounter (Signed)
This is a [facility] pt 

## 2019-10-15 ENCOUNTER — Other Ambulatory Visit: Payer: Self-pay | Admitting: Surgical

## 2019-11-13 IMAGING — CT CT HEAD W/O CM
3 series · 15 of 47 positions shown, 18 images · non-contrast
Comparison: 08/18/2017, 08/16/2017, 05/11/2017 MRI brain 04/24/2016

CLINICAL DATA: Severe headache

EXAM:
CT HEAD WITHOUT CONTRAST
TECHNIQUE: Contiguous axial images were obtained from the base of the skull
through the vertex without intravenous contrast.

[Series 3: head 5.0 h30s · axial · 0.42mm/px · z∈[-133,-3]mm · 9 of 32 slices shown, 12 images]
[im 3/32  brain]
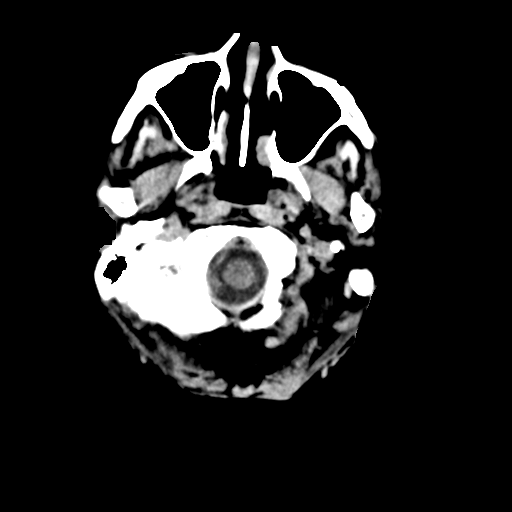
[im 3/32  bone]
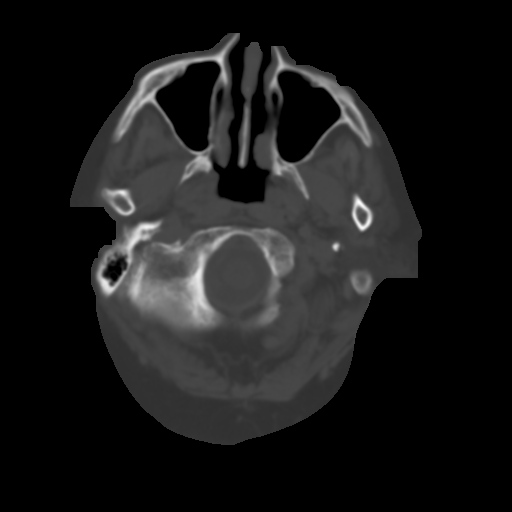
[im 6/32  brain]
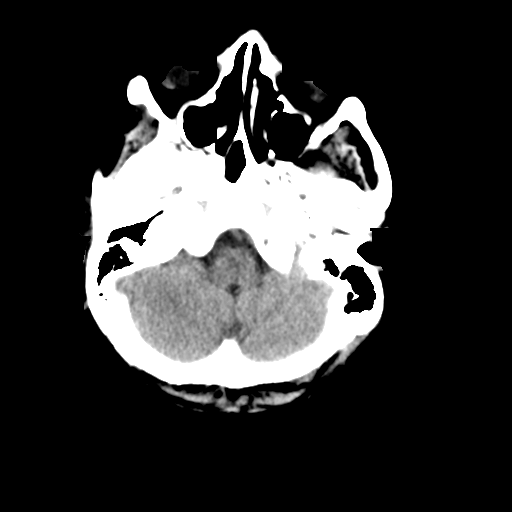
[im 9/32  brain]
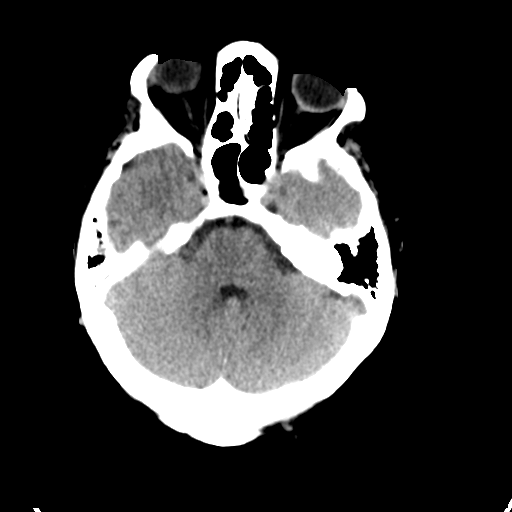
[im 12/32  brain]
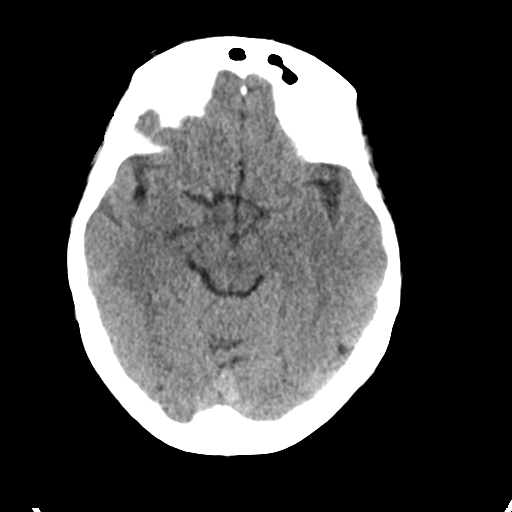
[im 17/32  brain]
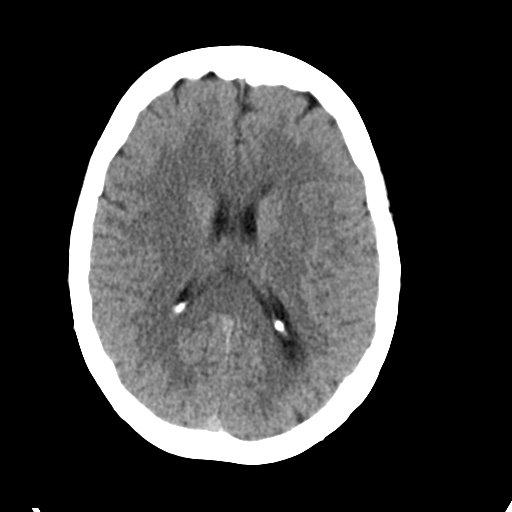
[im 17/32  bone]
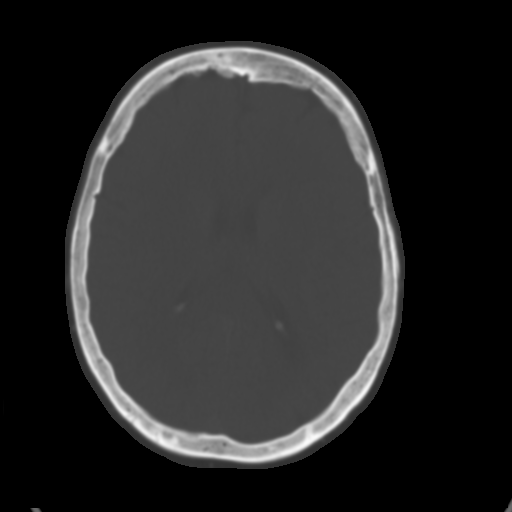
[im 20/32  brain]
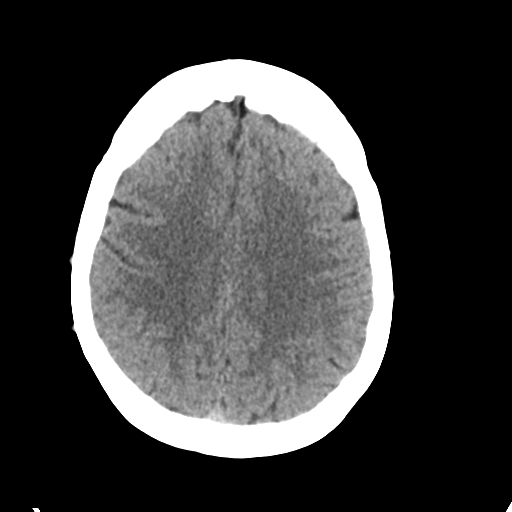
[im 23/32  brain]
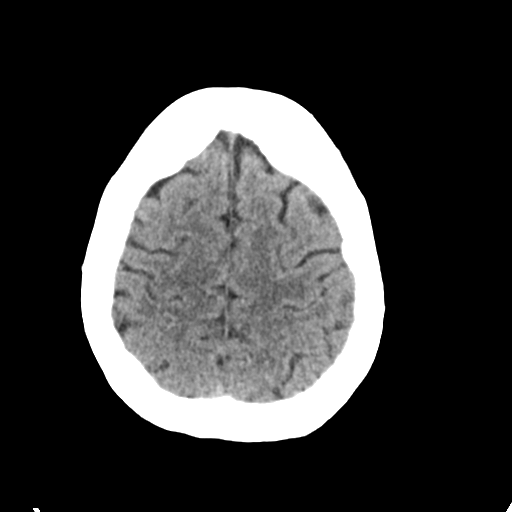
[im 26/32  brain]
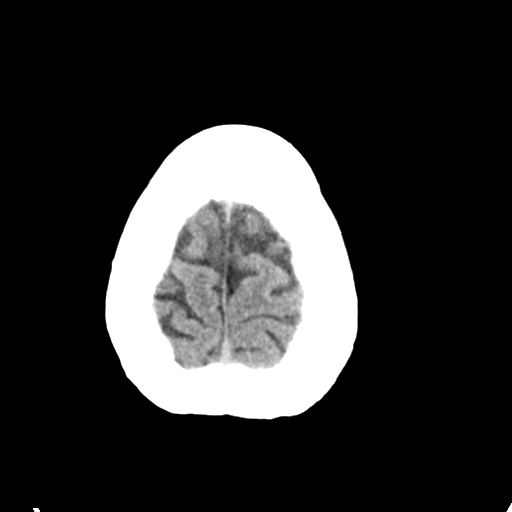
[im 29/32  brain]
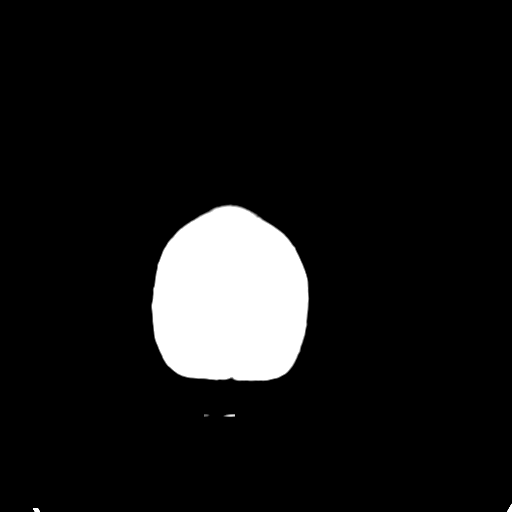
[im 29/32  bone]
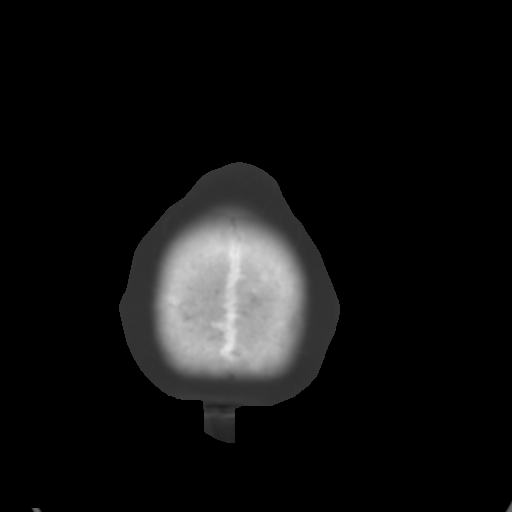

[Series 5: head 3.0 mpr cor · coronal · 0.30mm/px · 3 of 67 slices shown]
[im 23/67  brain]
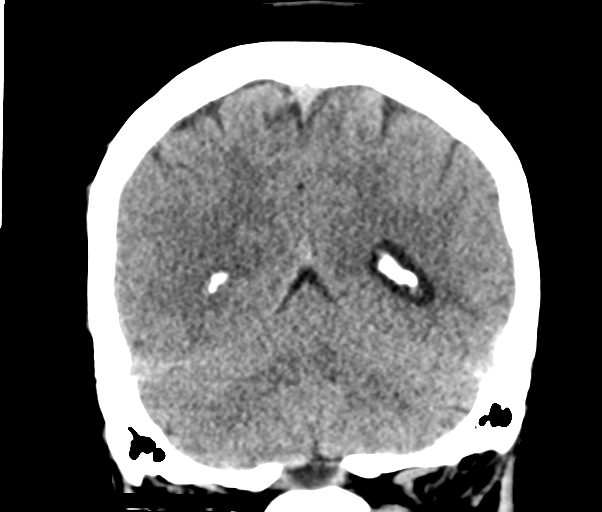
[im 30/67  brain]
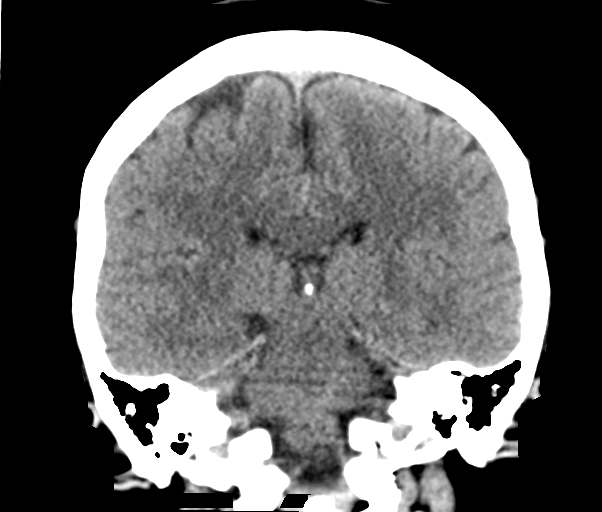
[im 37/67  brain]
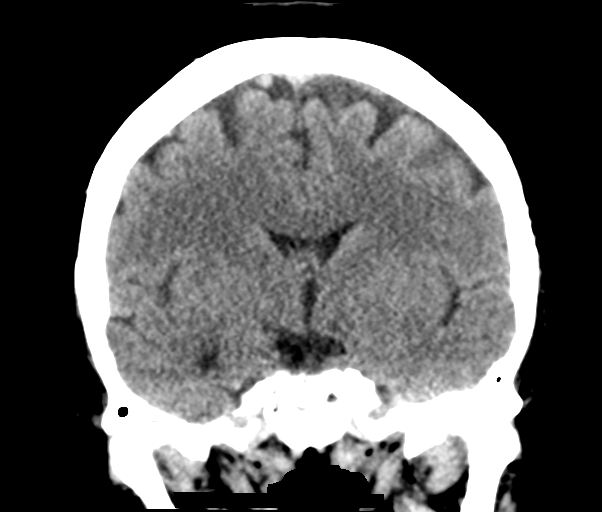

[Series 6: head 3.0 mpr sag · sagittal · 0.31mm/px · 3 of 59 slices shown]
[im 20/59  brain]
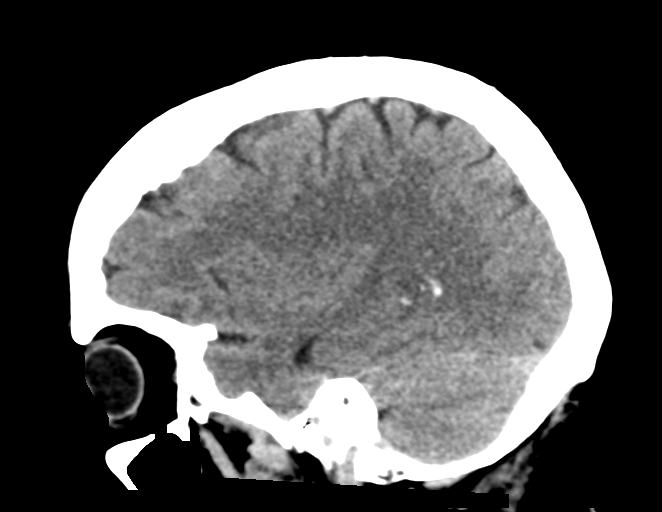
[im 30/59  brain]
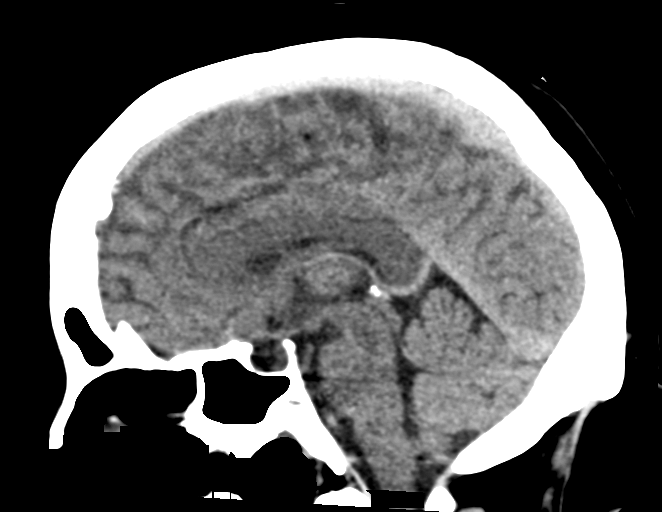
[im 39/59  brain]
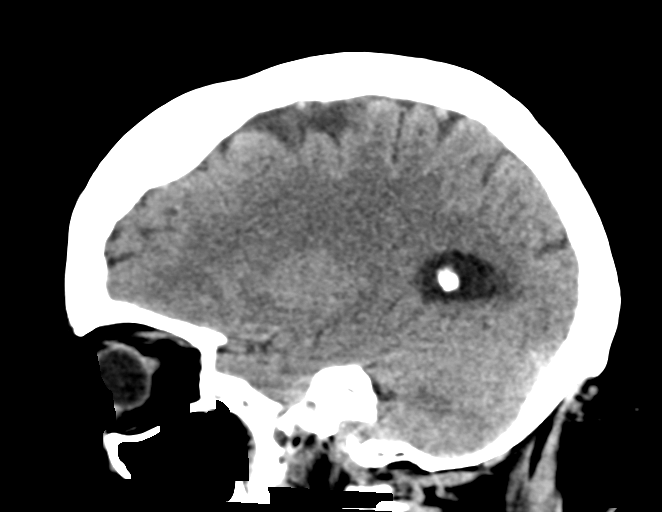

[15 of 47 positions shown; findings below may reference images not displayed]

FINDINGS: Brain: No acute territorial infarction, hemorrhage or intracranial
mass is visualized. Similar appearance of left parietooccipital
encephalomalacia. Stable ventricle size.

Vascular: No hyperdense vessels. Scattered calcifications at the
carotid siphons.

Skull: Normal. Negative for fracture or focal lesion.

Sinuses/Orbits: Mucosal thickening in the sphenoid and ethmoid
sinuses. No acute orbital abnormality.

Other: None
IMPRESSION: No CT evidence for acute intracranial abnormality. Old left
parietooccipital infarct.

## 2019-11-21 IMAGING — DX DG HAND COMPLETE 3+V*L*
3 series · 3 of 3 positions shown · non-contrast
Comparison: Third digit radiograph earlier this day at 3847 hour.
Hand radiograph 12/17/2013

CLINICAL DATA: Left hand pain after fall yesterday.

EXAM:
LEFT HAND - COMPLETE 3+ VIEW

[hand ap]
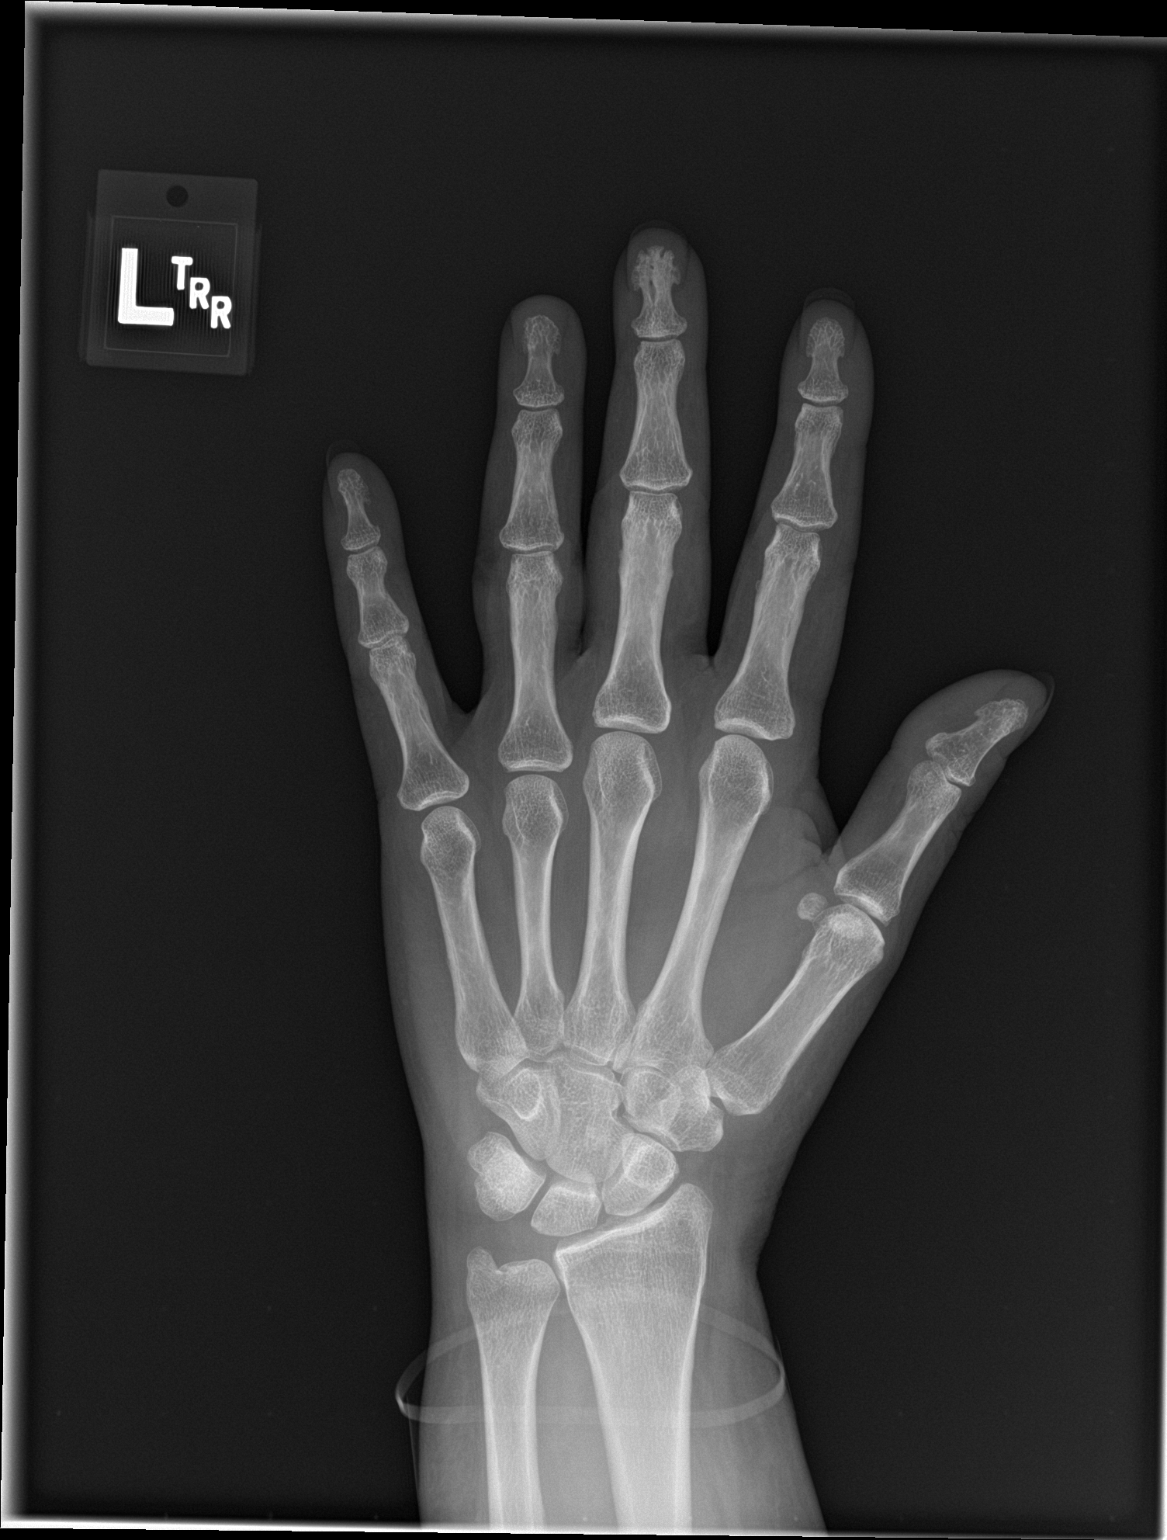

[hand obl]
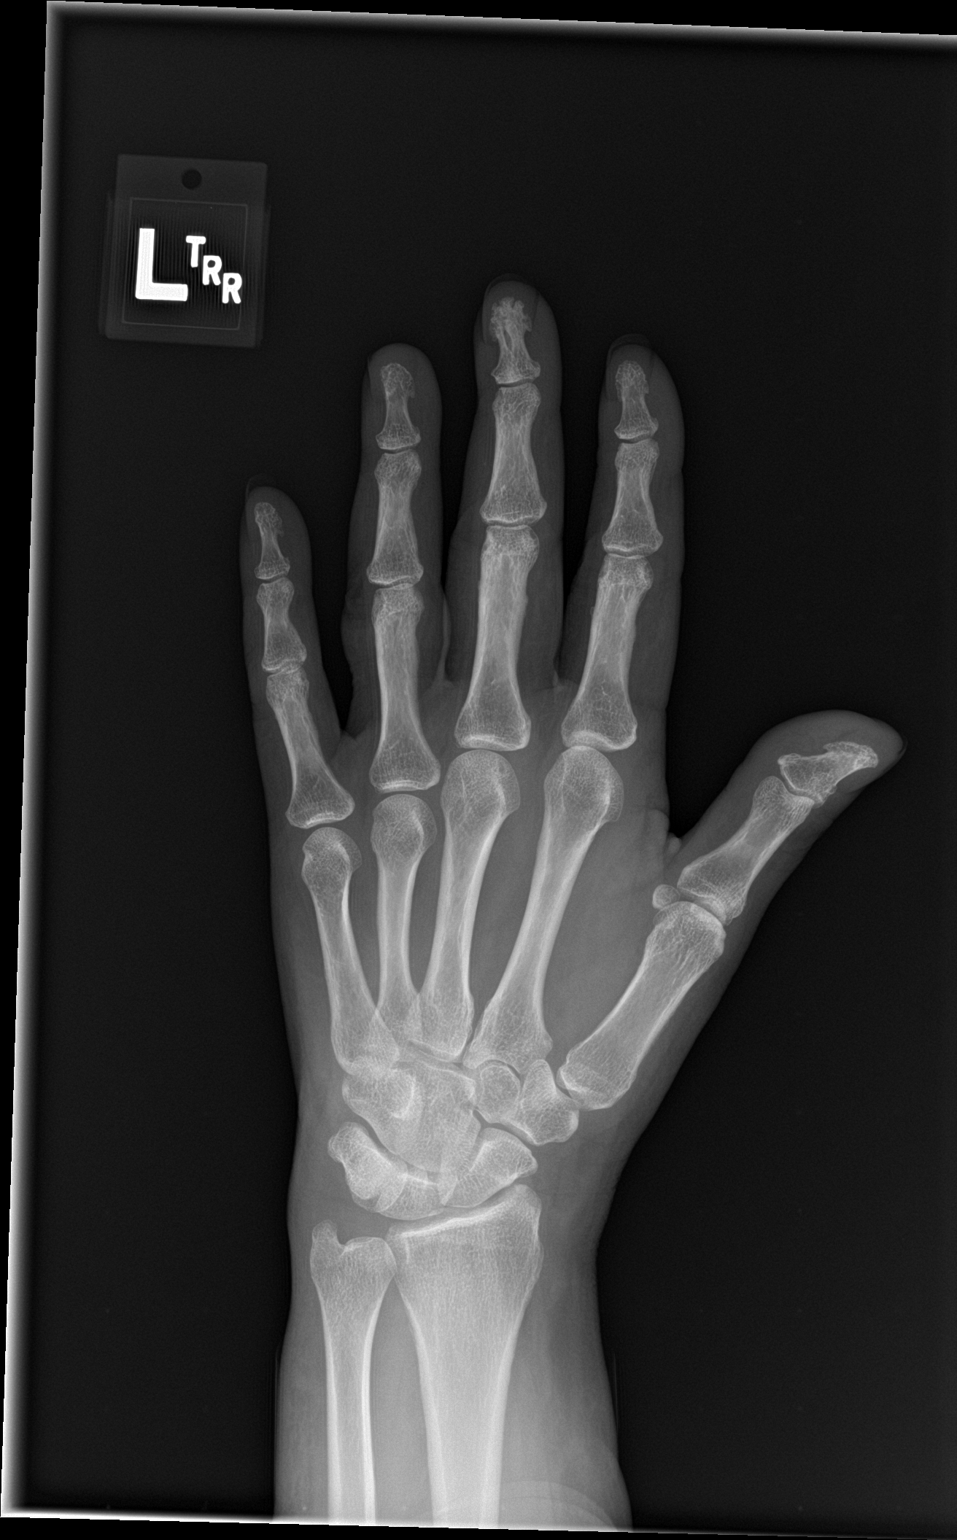

[hand lat]
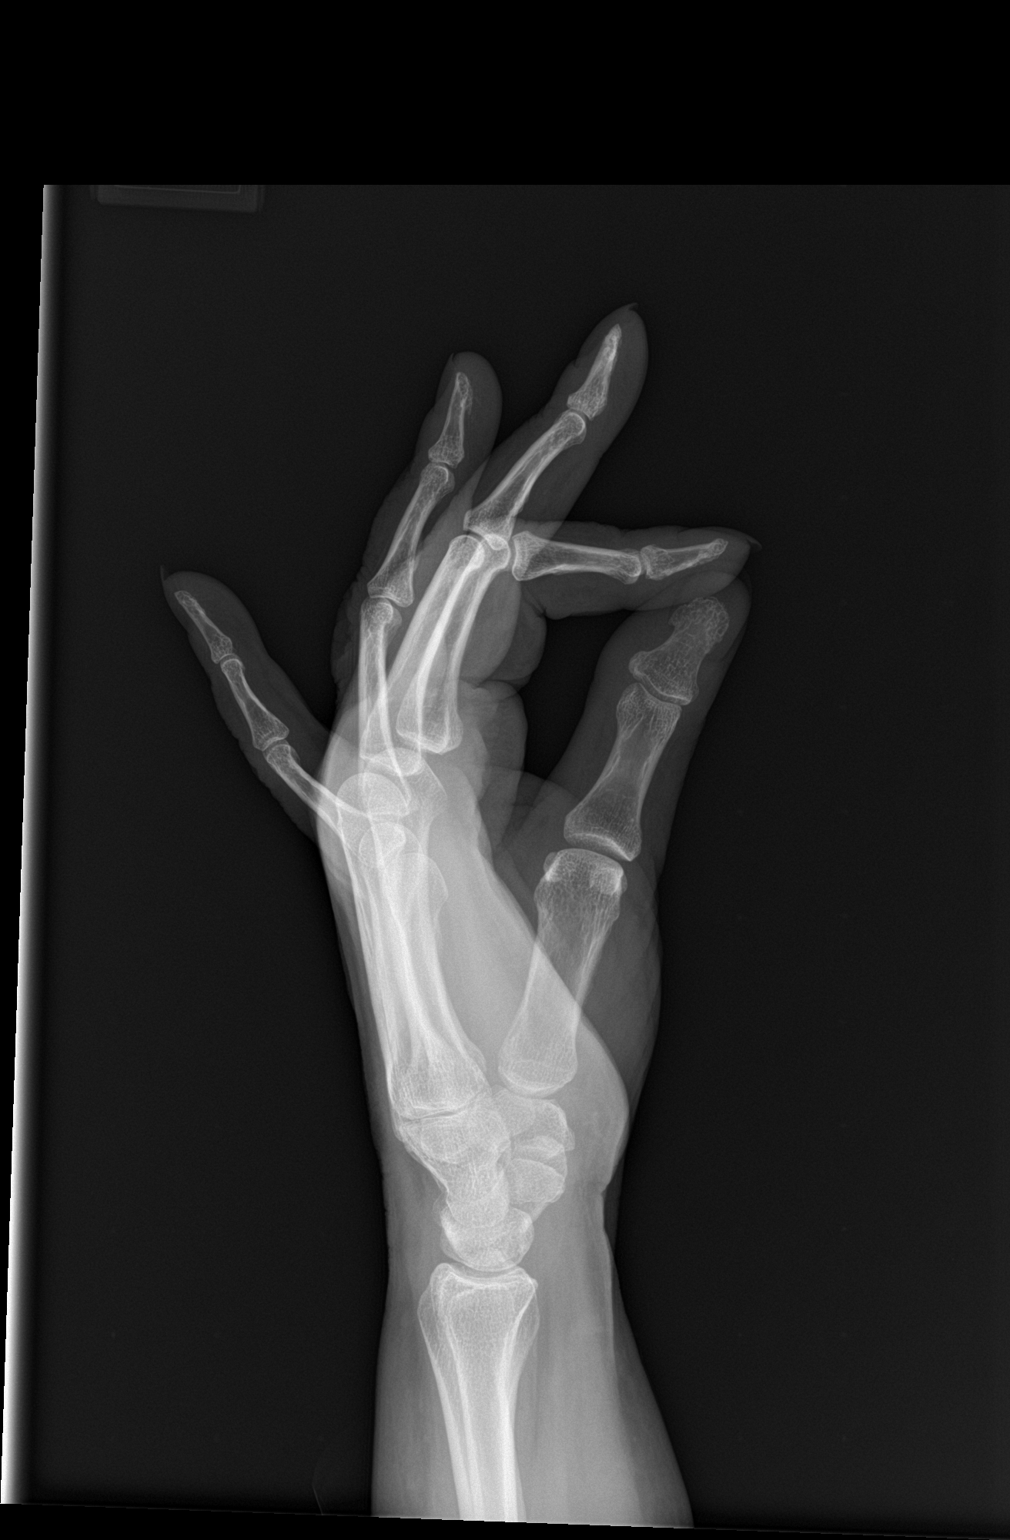

[3 of 3 positions shown; findings below may reference images not displayed]

FINDINGS: There is no evidence of acute fracture or dislocation. Linear
lucency near the tip of the third digit distal phalanx is unchanged
from 2168 exam. No significant arthropathy. Soft tissues are
unremarkable.
IMPRESSION: No fracture or acute osseous abnormality of the left hand.

## 2019-11-21 IMAGING — CR DG FINGER MIDDLE 2+V*L*
1 series · 3 of 3 positions shown · non-contrast
Comparison: 12/17/2013

CLINICAL DATA: Fall yesterday.  Left middle finger pain

EXAM:
LEFT MIDDLE FINGER 2+V

[Series 1: dg finger middle left · 0.14mm/px · 3 of 3 slices shown]
[im 1/3]
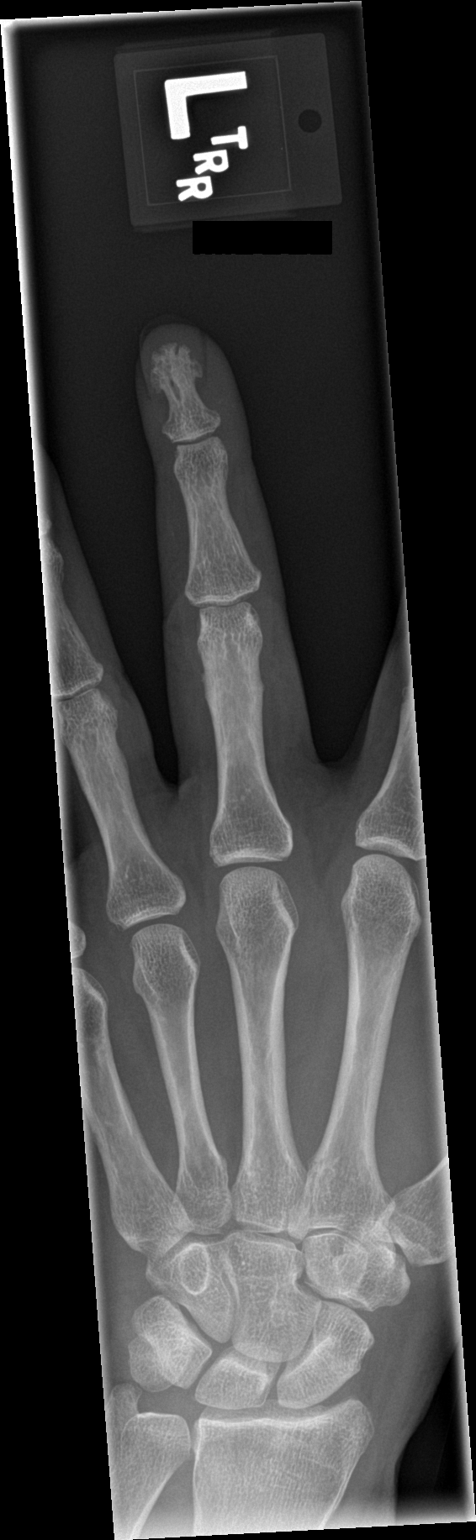
[im 2/3]
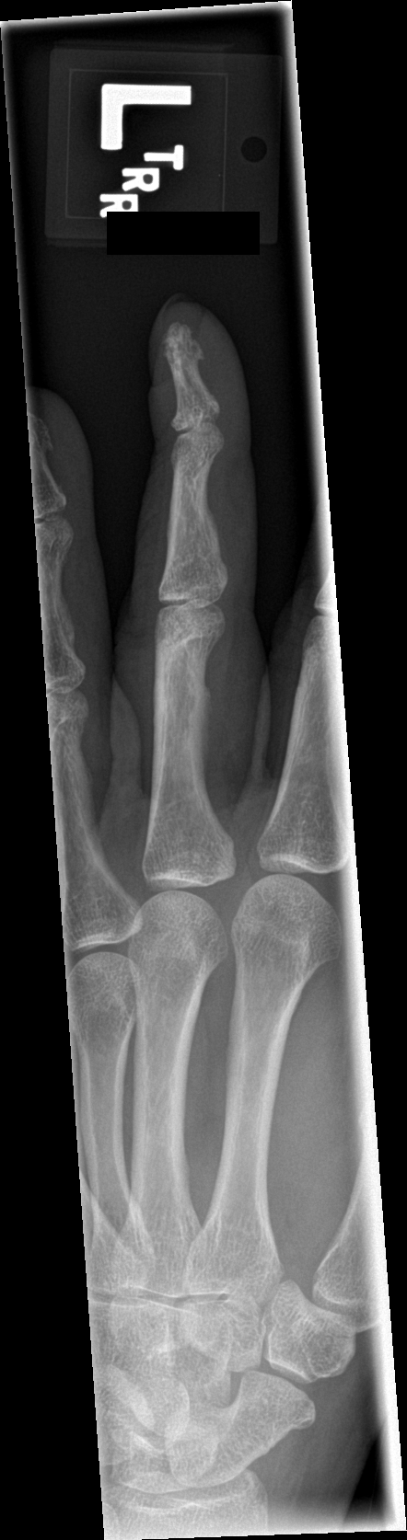
[im 3/3]
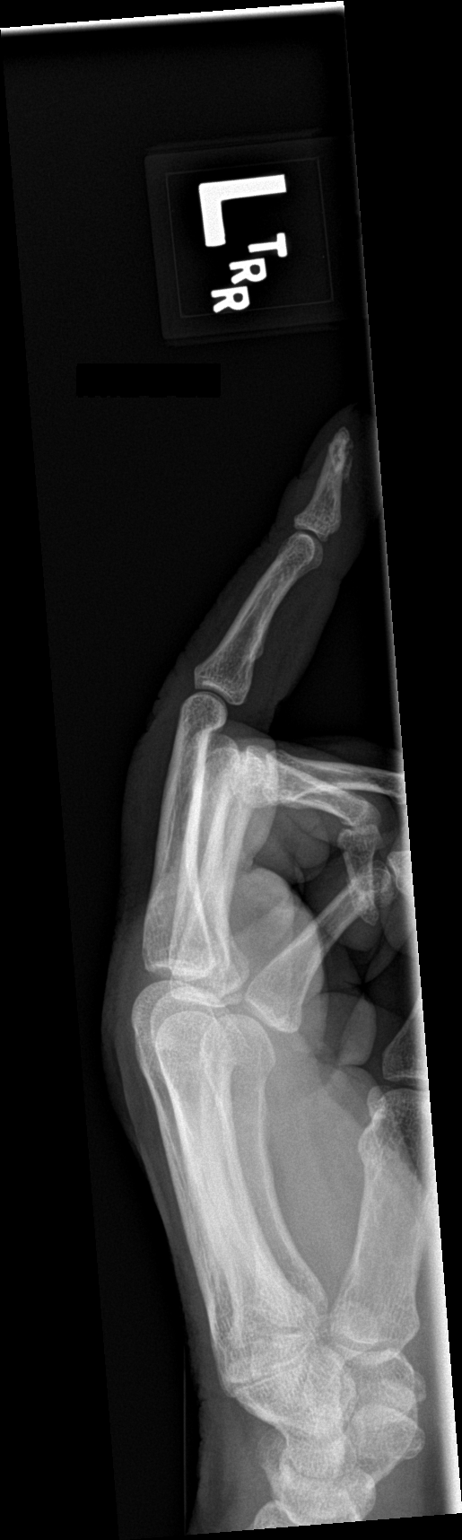

[3 of 3 positions shown; findings below may reference images not displayed]

FINDINGS: Linear lucency near the tip of the distal phalanx is stable since
9668 and may be related to old injury. No acute fracture,
subluxation or dislocation. Soft tissues are intact.
IMPRESSION: No acute bony abnormality.

## 2019-11-26 IMAGING — DX DG TIBIA/FIBULA 2V*L*
2 series · 3 of 3 positions shown · non-contrast
Comparison: 04/10/2009

CLINICAL DATA: Fall, leg injury, pain

EXAM:
LEFT TIBIA AND FIBULA - 2 VIEW

[tibia ap (1 of 2)]
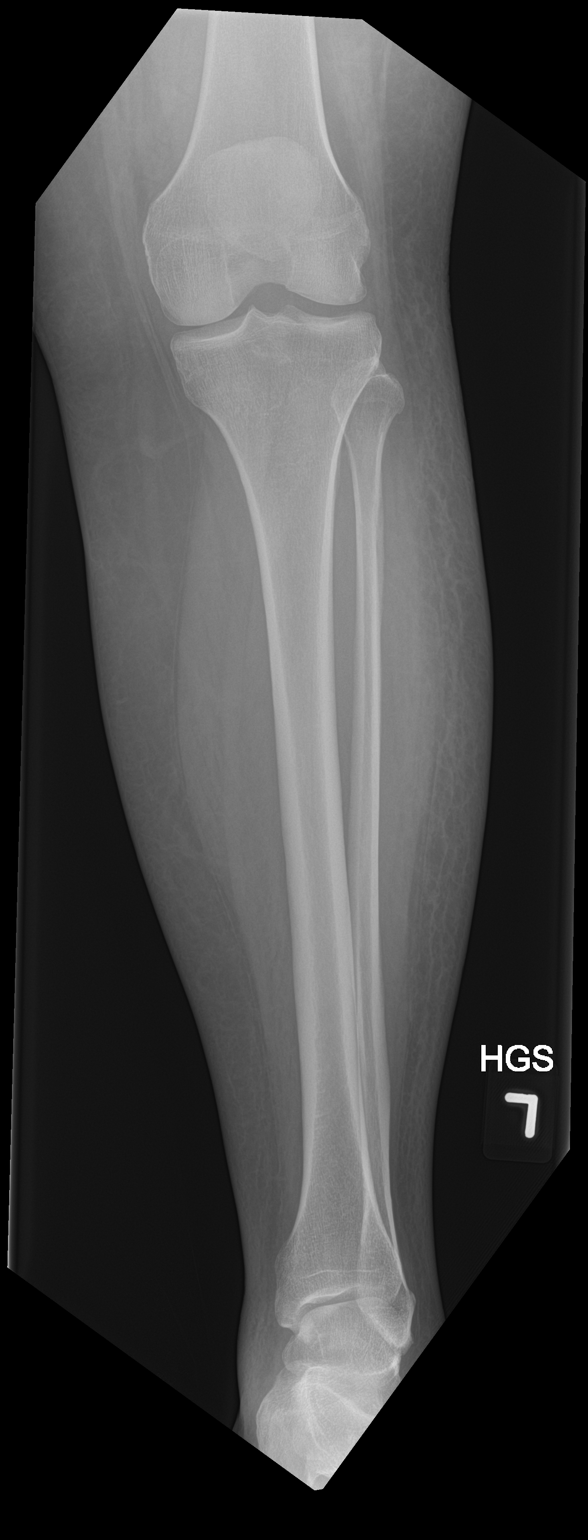

[Series 2: tibia ap · 0.14mm/px · 2 of 2 slices shown (2 of 2)]
[im 1/2]
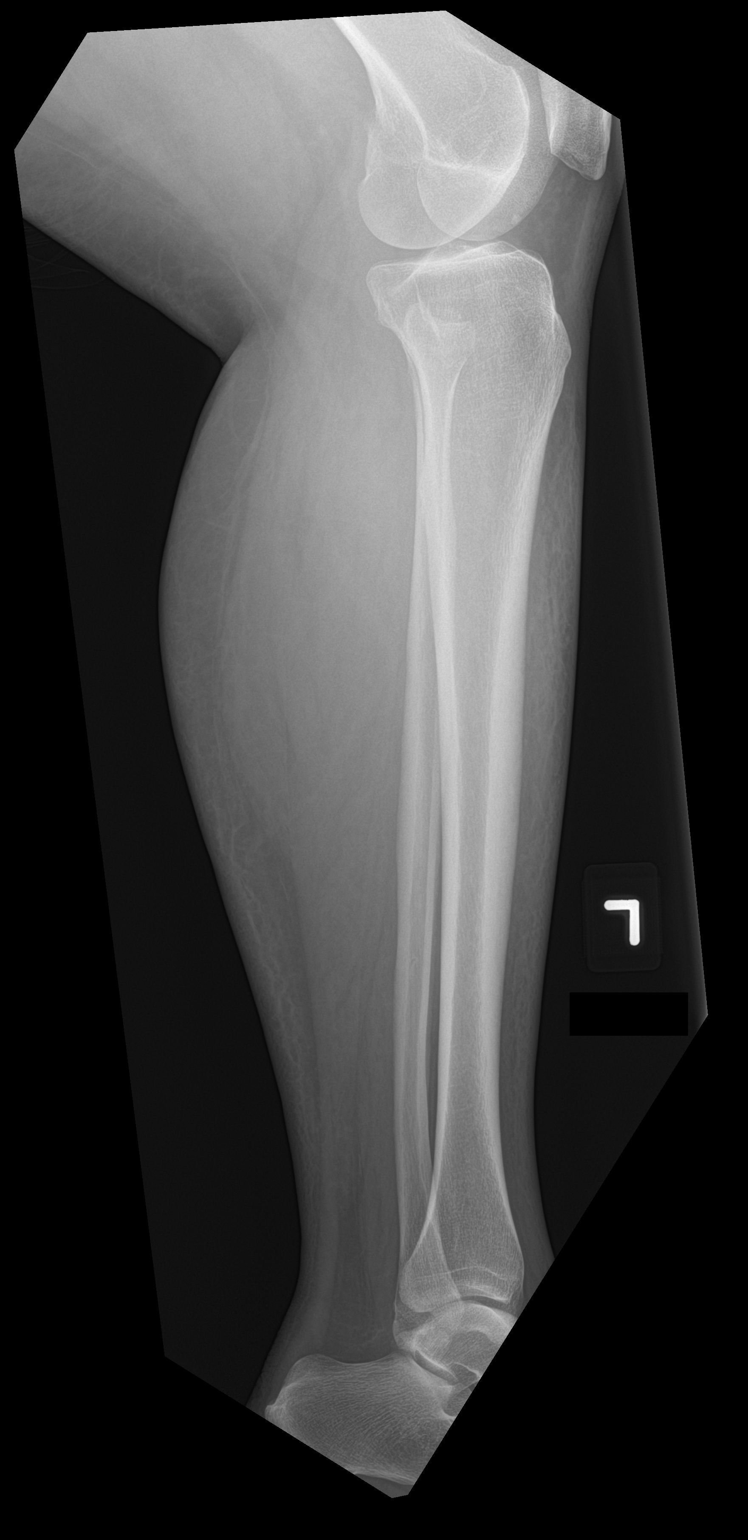
[im 2/2]
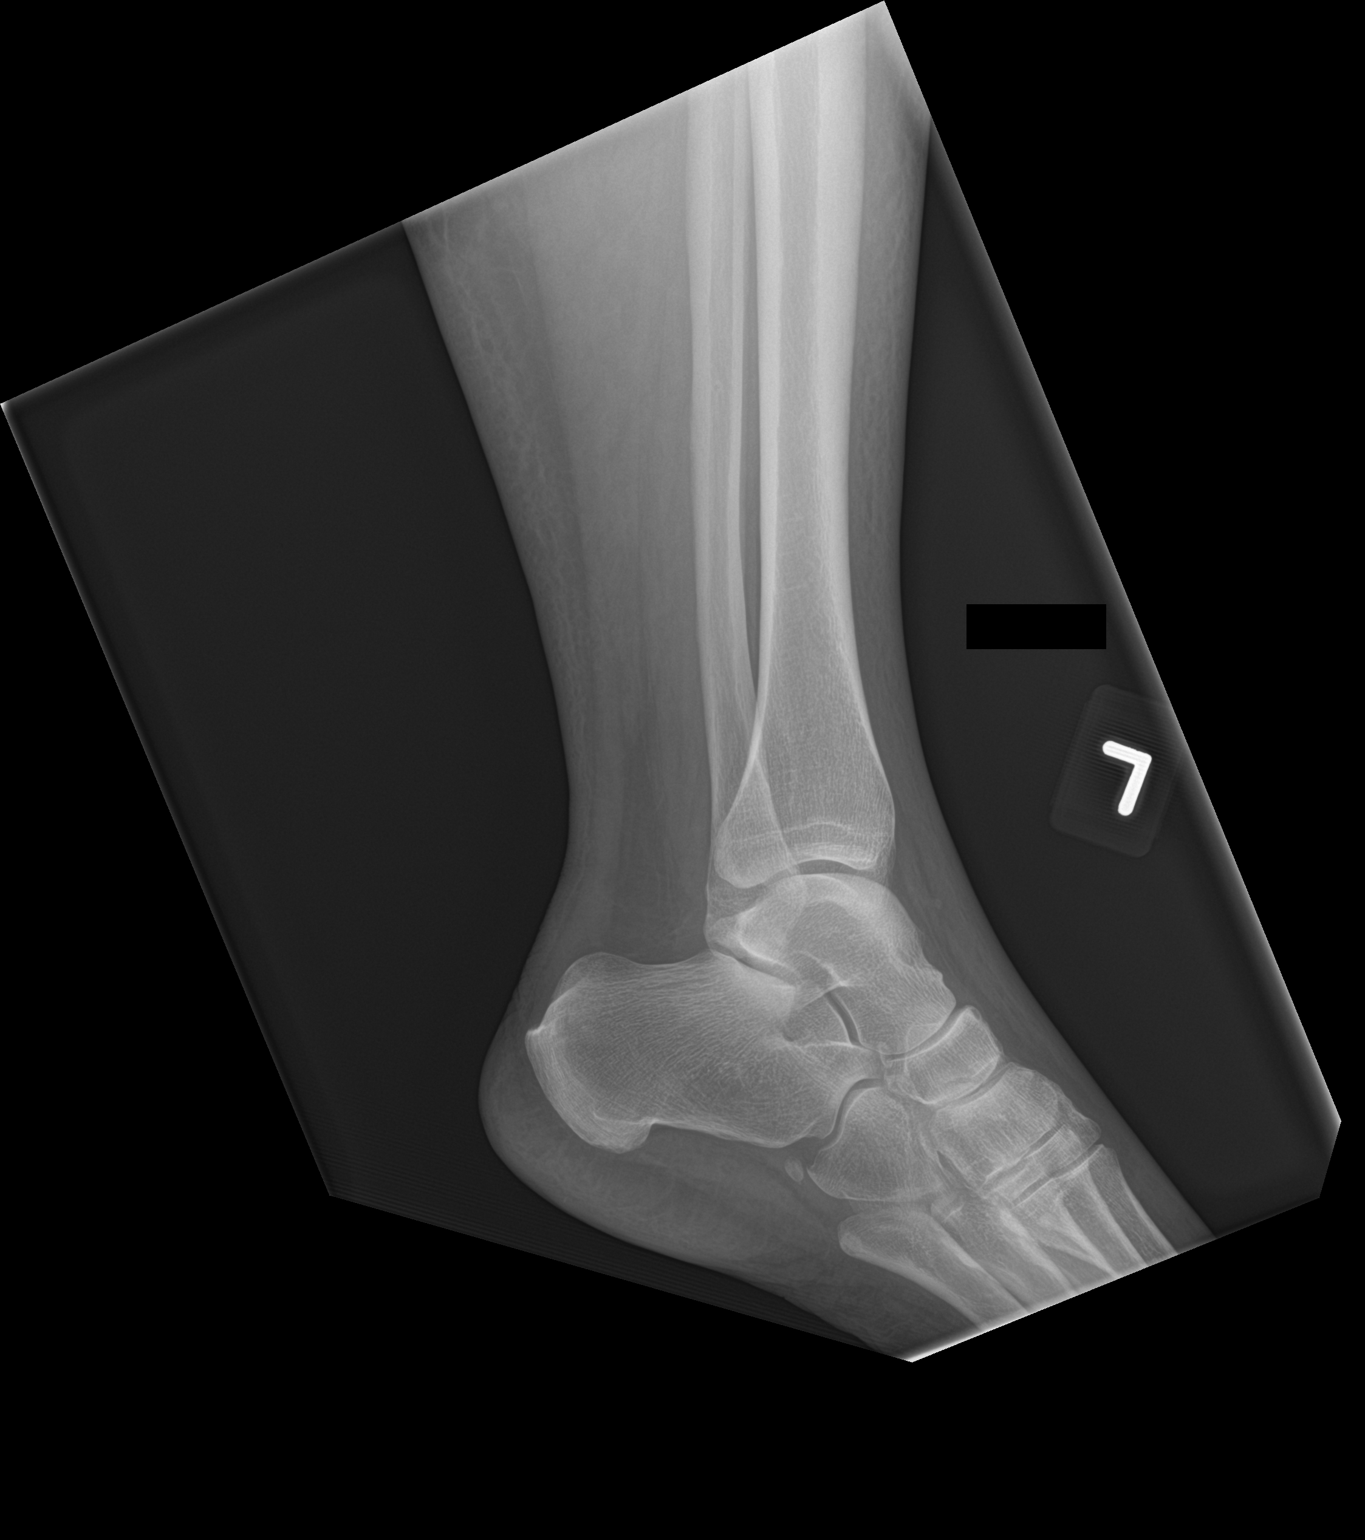

[3 of 3 positions shown; findings below may reference images not displayed]

FINDINGS: Left lower extremity subcutaneous edema noted diffusely. Left tibia
and fibula appear intact. No malalignment or acute osseous finding.
Negative for fracture. No radiopaque foreign body.
IMPRESSION: No acute osseous finding.

## 2019-12-08 ENCOUNTER — Other Ambulatory Visit: Payer: Self-pay

## 2019-12-08 ENCOUNTER — Ambulatory Visit: Payer: 59 | Admitting: Neurology

## 2019-12-08 ENCOUNTER — Encounter: Payer: Self-pay | Admitting: Neurology

## 2019-12-08 VITALS — BP 135/89 | HR 85 | Temp 97.8°F | Wt 190.0 lb

## 2019-12-08 DIAGNOSIS — R202 Paresthesia of skin: Secondary | ICD-10-CM

## 2019-12-08 NOTE — Progress Notes (Signed)
Reason for visit: Migraine headache, prior intracranial hemorrhage, memory disturbance, possible seizures  Referring physician: Dr. Princella Ion is a 54 y.o. female  History of present illness:  Stephanie [name] is a 21 year old right-handed white female who has been seen through this office by Dr. Erlinda Hong in 2017 after the patient was admitted in August 2017 with headache and was noted to have a left parietal-occipital intracranial hemorrhage.  The patient does report some memory deficits since that time.  The source of the hemorrhage was never definitely determined.  The patient has had an EEG study showing left temporal sharp wave activity around that time, she was placed on Keppra and has remained on Keppra.  The patient claims that she has episodes lasting 1 to 2 days where her head will feel tight and she feels like she is in a different world.  This may occur 2 or 3 times a month.  The patient cannot sleep during these episodes.  It is not clear whether these actually represent seizures.  The patient also has a history of migraine headaches since age 42, usually on the right frontotemporal area but occasionally may be on the left.  She is only having about 2-3 headaches a month currently as she has responded quite well to [employer].  She takes Nurtec if needed.  The patient reports photophobia and phonophobia with the headache, she may have some dizziness and some nausea and vomiting.  She denies any particular activating factors for the headache.  Headaches may last up to 3 days if she does not treat the headache aggressively.  Excedrin Migraine may help.  She drinks 2 cups of coffee daily but no other caffeinated products.  The patient has a history of sleep apnea on CPAP, she is being considered for a glossopharyngeal nerve stimulator.  The patient has history of hepatitis C and diabetes, she reports some problems with cold sensation in the feet that is bothersome to her.  She has some  low back pain as well that is chronic, no neck pain.  She denies issues controlling the bowels or the bladder.  She does have some mild gait instability.  She has not had any falls.  She has been followed by Dr. Manuella Ghazi and Sunday Corn of neurology in [place], [place], but she claims that she wishes to be followed here now.  In the past, she has had problems with drug-seeking behavior.  Past Medical History:  Diagnosis Date  . Diabetes mellitus without complication ([place])   . Difficult intubation   . Diverticulitis   . Headache   . Hepatitis C    treated and resolved  . History of kidney stones   . Hypertension   . IBS (irritable bowel syndrome)   . Kidney stones   . Sciatica   . Sleep apnea    on cpap  . Stroke [facility]) 2017   memory loss    Past Surgical History:  Procedure Laterality Date  . ABDOMINAL HYSTERECTOMY    . carpel tunnel syndrome  2017  . HARDWARE REMOVAL Right 12/22/2017   Procedure: HARDWARE REMOVAL-RIGHT ARM;  Surgeon: Lovell Sheehan, MD;  Location: [facility] ORS;  Service: Orthopedics;  Laterality: Right;  Removal of implant, superficial right humerus  . HUMERUS IM NAIL Right 03/11/2017   Procedure: INTRAMEDULLARY (IM) NAIL HUMERAL;  Surgeon: Lovell Sheehan, MD;  Location: [facility] ORS;  Service: Orthopedics;  Laterality: Right;  . KIDNEY STONE SURGERY Right 02/12/12  . KIDNEY STONE  SURGERY Left 07/15/2012  . LIGATION OF ARTERIOVENOUS  FISTULA Left 06/21/2016   Procedure: LIGATION OF ARTERIOVENOUS  FISTULA ( LIGATION BASILIC VEIN );  Surgeon: Renford Dills, MD;  Location: [facility] ORS;  Service: Vascular;  Laterality: Left;  . LITHOTRIPSY    . OOPHORECTOMY    . SINUS EXPLORATION    . TONSILLECTOMY      Family History  Problem Relation Age of Onset  . Heart failure Mother   . Diabetes Father   . Cancer Sister     Social history:  reports that she has been smoking cigarettes. She has a 1.50 pack-year smoking history. She has never used smokeless tobacco. She reports  previous alcohol use. She reports that she does not use drugs.  Medications:  Prior to Admission medications   Medication Sig Start Date End Date Taking? Authorizing Provider  alprazolam Prudy Feeler) 2 MG tablet Take 2 mg by mouth 2 (two) times daily as needed for anxiety. 08/04/19  Yes [provider]  budesonide-formoterol (SYMBICORT) 160-4.5 MCG/ACT inhaler Inhale 2 puffs into the lungs 2 (two) times daily.   Yes [provider]  Cetirizine HCl (ZYRTEC PO) Take by mouth.   Yes [provider]  chlorthalidone (HYGROTON) 25 MG tablet Take 25 mg by mouth daily. 07/30/19  Yes [provider]  cholecalciferol (VITAMIN D3) 25 MCG (1000 UT) tablet Take 1,000 Units by mouth daily.   Yes [provider]  cloNIDine (CATAPRES) 0.2 MG tablet Take 0.2 mg by mouth 2 (two) times daily. 10/02/18  Yes [provider]  clopidogrel (PLAVIX) 75 MG tablet Take 1 tablet (75 mg total) by mouth daily. 06/27/16  Yes Marvel Plan, MD  EMGALITY 120 MG/ML SOSY Inject 120 mg into the skin every 30 (thirty) days. 07/13/19  Yes [provider]  etodolac (LODINE) 300 MG capsule Take 1 capsule (300 mg total) by mouth 3 (three) times daily as needed (pain). 01/16/19  Yes Linwood Dibbles, MD  FLUoxetine (PROZAC) 40 MG capsule Take 40 mg by mouth daily.  06/28/18  Yes [provider]  glipiZIDE (GLUCOTROL) 5 MG tablet Take 1 tablet (5 mg total) by mouth 2 (two) times daily before a meal. 04/30/16  Yes Lora Paula, MD  hydrALAZINE (APRESOLINE) 100 MG tablet Take 100 mg by mouth 3 (three) times daily. 07/30/19  Yes [provider]  ibuprofen (ADVIL) 200 MG tablet Take 800 mg by mouth every 6 (six) hours as needed for headache.   Yes [provider]  lamoTRIgine (LAMICTAL) 25 MG tablet Take 25-50 mg by mouth 2 (two) times a day. Take 25 mg in the am and 50 mg at night. 02/12/19 03/13/20 Yes [provider]  omeprazole (PRILOSEC) 40 MG capsule Take  40 mg by mouth daily as needed for heartburn. 08/13/19  Yes [provider]  pantoprazole (PROTONIX) 40 MG tablet Take 40 mg by mouth daily.   Yes [provider]  promethazine (PHENERGAN) 25 MG tablet Take 1 tablet (25 mg total) by mouth every 6 (six) hours as needed for nausea or vomiting. 04/21/19  Yes Horton, Mayer Masker, MD  traZODone (DESYREL) 100 MG tablet Take 100 mg by mouth at bedtime.   Yes [provider]  losartan (COZAAR) 100 MG tablet Take 100 mg by mouth daily. 10/02/18   [provider]      Allergies  Allergen Reactions  . Gabapentin Itching  . Ibuprofen Itching  . Tylenol [Acetaminophen] Itching  . Aspirin Itching  .  Buprenorphine Hcl Itching  . Carbamazepine Itching, Other (See Comments) and Rash    Makes her feel like something is crawling under her skin.  . Codeine Itching and Other (See Comments)    Makes her feel like something is crawling under her skin. Can take, sometimes makes her itch  . Compazine Itching and Other (See Comments)    "makes my skin crawl"  . Ioxaglate Itching  . Ivp Dye [Iodinated Diagnostic Agents] Itching and Other (See Comments)    Makes her itch really bad. Had to get 2-3 shots of Benadryl when she was in the hospital.  . Metrizamide Itching  . Naproxen Itching  . Norco [Hydrocodone-Acetaminophen] Itching  . Other Itching and Other (See Comments)    Makes her itch really bad. Had to get 2-3 shots of Benadryl when she was in the hospital.  . Penicillin G Hives, Rash and Other (See Comments)    Has patient had a PCN reaction causing immediate rash, facial/tongue/throat swelling, SOB or lightheadedness with hypotension: Yes Has patient had a PCN reaction causing severe rash involving mucus membranes or skin necrosis: No Has patient had a PCN reaction that required hospitalization: No Has patient had a PCN reaction occurring within the last 10 years: No If all of the above answers are "NO", then may  proceed with Cephalosporin use.   Marland Kitchen Prochlorperazine Maleate Other (See Comments)    Compazine makes her skin crawl - needs 2-3 shots of Benadryl to get relief.  . Reglan [Metoclopramide] Itching and Other (See Comments)    Makes her skin crawl  . Sulfur Hives, Rash and Other (See Comments)    Skin Rashes, Hives  . Toradol [Ketorolac Tromethamine] Itching  . Tramadol Itching  . Zofran [Ondansetron Hcl] Itching    ROS:  Out of a complete 14 system review of symptoms, the patient complains only of the following symptoms, and all other reviewed systems are negative.  Headache Low back pain Coldness of the feet Mild walking problems Memory disturbance  Blood pressure 135/89, pulse 85, temperature 97.8 F (36.6 C), weight 190 lb (86.2 kg).  Physical Exam  General: The patient is alert and cooperative at the time of the examination.  The patient is moderately obese.  Eyes: Pupils are equal, round, and reactive to light. Discs are flat bilaterally.  Neck: The neck is supple, no carotid bruits are noted.  Respiratory: The respiratory examination is clear.  Cardiovascular: The cardiovascular examination reveals a regular rate and rhythm, no obvious murmurs or rubs are noted.  Skin: Extremities are without significant edema.  Neurologic Exam  Mental status: The patient is alert and oriented x 3 at the time of the examination. The patient has apparent normal recent and remote memory, with an apparently normal attention span and concentration ability.  Cranial nerves: Facial symmetry is present. There is good sensation of the face to pinprick and soft touch bilaterally. The strength of the facial muscles and the muscles to head turning and shoulder shrug are normal bilaterally. Speech is well enunciated, no aphasia or dysarthria is noted. Extraocular movements are full. Visual fields are full. The tongue is midline, and the patient has symmetric elevation of the soft palate. No  obvious hearing deficits are noted.  Motor: The motor testing reveals 5 over 5 strength of all 4 extremities. Good symmetric motor tone is noted throughout.  Sensory: Sensory testing is intact to pinprick, soft touch, vibration sensation, and position sense on all 4 extremities. No evidence of extinction  is noted.  Coordination: Cerebellar testing reveals good finger-nose-finger and heel-to-shin bilaterally.  Gait and station: Gait is normal. Tandem gait is unsteady. Romberg is negative. No drift is seen.  Reflexes: Deep tendon reflexes are symmetric and normal bilaterally. Toes are downgoing bilaterally.   Assessment/Plan:  1.  Migraine headache  2.  History of left parotid occipital intracranial hemorrhage  3.  Reported memory disturbance  4.  Questionable history of seizures  5.  Cold sensations in the feet, possible neuropathy  6.  Diabetes  7.  History of hepatitis C  8.  Chronic low back pain  9.  Sleep apnea on CPAP  The patient has multiple medical issues.  She claims that her migraine headaches are under good control currently, we will continue the Emgality and Nurtec.  Given the cold sensations in the feet, she will be set up for nerve conductions on both legs and EMG on 1 leg.  The patient has had some stable memory issues she claims since the stroke in 2017.  She will follow-up here in 6 months. The referral note mentions syncopal events, but the patient and her spouse never indicated this to me.  Stephanie Palau MD 12/08/2019 10:48 AM  [facility] [address] [place], [place] [address]  Phone [phone] Fax [phone]

## 2020-01-17 ENCOUNTER — Ambulatory Visit (INDEPENDENT_AMBULATORY_CARE_PROVIDER_SITE_OTHER): Payer: Medicaid Other | Admitting: Neurology

## 2020-01-17 ENCOUNTER — Other Ambulatory Visit: Payer: Self-pay

## 2020-01-17 ENCOUNTER — Encounter: Payer: Self-pay | Admitting: Neurology

## 2020-01-17 ENCOUNTER — Ambulatory Visit: Payer: Medicaid Other | Admitting: Neurology

## 2020-01-17 DIAGNOSIS — R202 Paresthesia of skin: Secondary | ICD-10-CM | POA: Diagnosis not present

## 2020-01-17 DIAGNOSIS — E119 Type 2 diabetes mellitus without complications: Secondary | ICD-10-CM

## 2020-01-17 DIAGNOSIS — Z0289 Encounter for other administrative examinations: Secondary | ICD-10-CM

## 2020-01-17 NOTE — Progress Notes (Signed)
Please refer to EMG and nerve conduction procedure note.  

## 2020-01-17 NOTE — Progress Notes (Addendum)
The patient comes in today for EMG nerve conduction study evaluation.  She complains of sensory alteration in toes of the feet on both sides with a cold sensation.  Nerve conductions on both legs were unremarkable, the patient may have a very early peripheral neuropathy however.  EMG of the right lower extremity was unremarkable, no evidence of an overlying lumbosacral radiculopathy was seen.       MNC    Nerve / Sites Muscle Latency Ref. Amplitude Ref. Rel Amp Segments Distance Velocity Ref. Area    ms ms mV mV %  cm m/s m/s mVms  L Peroneal - EDB     Ankle EDB 3.9 ?6.5 3.5 ?2.0 100 Ankle - EDB 9   9.4     Fib head EDB 9.1  3.0  83.7 Fib head - Ankle 25 48 ?44 8.2     Pop fossa EDB 11.2  3.1  104 Pop fossa - Fib head 10 48 ?44 8.7         Pop fossa - Ankle      R Peroneal - EDB     Ankle EDB 3.9 ?6.5 2.4 ?2.0 100 Ankle - EDB 9   7.1     Fib head EDB 9.4  2.2  92.8 Fib head - Ankle 25 46 ?44 7.0     Pop fossa EDB 11.5  1.9  86.4 Pop fossa - Fib head 10 47 ?44 6.2         Pop fossa - Ankle      L Tibial - AH     Ankle AH 3.3 ?5.8 12.2 ?4.0 100 Ankle - AH 9   21.7     Pop fossa AH 10.5  9.3  76.5 Pop fossa - Ankle 33 45 ?41 20.1  R Tibial - AH     Ankle AH 4.1 ?5.8 11.9 ?4.0 100 Ankle - AH 9   23.3     Pop fossa AH 11.6  8.3  69.7 Pop fossa - Ankle 34 45 ?41 18.7             SNC    Nerve / Sites Rec. Site Peak Lat Ref.  Amp Ref. Segments Distance    ms ms V V  cm  L Sural - Ankle (Calf)     Calf Ankle 3.5 ?4.4 7 ?6 Calf - Ankle 14  R Sural - Ankle (Calf)     Calf Ankle 3.5 ?4.4 6 ?6 Calf - Ankle 14  L Superficial peroneal - Ankle     Lat leg Ankle 4.1 ?4.4 10 ?6 Lat leg - Ankle 14  R Superficial peroneal - Ankle     Lat leg Ankle 3.9 ?4.4 4 ?6 Lat leg - Ankle 14             F  Wave    Nerve F Lat Ref.   ms ms  L Tibial - AH 47.5 ?56.0  R Tibial - AH 47.3 ?56.0

## 2020-01-17 NOTE — Procedures (Signed)
     HISTORY:  Stephanie [name] is a 54 year old patient with a history of cold and numb sensations in the toes of the feet bilaterally.  She has a history of diabetes and a history of hepatitis C.  She is being evaluated for a possible peripheral neuropathy.  NERVE CONDUCTION STUDIES:  Nerve conduction studies were performed on both lower extremities. The distal motor latencies and motor amplitudes for the peroneal and posterior tibial nerves were within normal limits. The nerve conduction velocities for these nerves were also normal. The sensory latencies for the peroneal and sural nerves were within normal limits. The F wave latencies for the posterior tibial nerves were within normal limits.   EMG STUDIES:  EMG study was performed on the right lower extremity:  The tibialis anterior muscle reveals 2 to 4K motor units with full recruitment. No fibrillations or positive waves were seen. The peroneus tertius muscle reveals 2 to 4K motor units with full recruitment. No fibrillations or positive waves were seen. The medial gastrocnemius muscle reveals 1 to 3K motor units with full recruitment. No fibrillations or positive waves were seen. The vastus lateralis muscle reveals 2 to 4K motor units with full recruitment. No fibrillations or positive waves were seen. The iliopsoas muscle reveals 2 to 4K motor units with full recruitment. No fibrillations or positive waves were seen. The biceps femoris muscle (long head) reveals 2 to 4K motor units with full recruitment. No fibrillations or positive waves were seen. The lumbosacral paraspinal muscles were tested at 3 levels, and revealed no abnormalities of insertional activity at all 3 levels tested. There was good relaxation.   IMPRESSION:  Nerve conduction studies done on both lower extremities were unremarkable, no evidence of a peripheral neuropathy was seen.  An early peripheral neuropathy or a small fiber neuropathy however may be missed by  standard nerve conduction studies, clinical correlation is required.  EMG evaluation of the right lower extremity was unremarkable, no evidence of an overlying lumbosacral radiculopathy was seen.  Marlan Palau MD 01/17/2020 4:04 PM  [facility] [address] [place], [place] [address]  Phone [phone] Fax [phone]

## 2020-01-22 IMAGING — DX DG CHEST 1V
1 series · 1 of 1 positions shown · non-contrast
Comparison: 05/11/2017

CLINICAL DATA: Postop hardware removal with low O2 saturation

EXAM:
CHEST  1 VIEW

[chest ap]
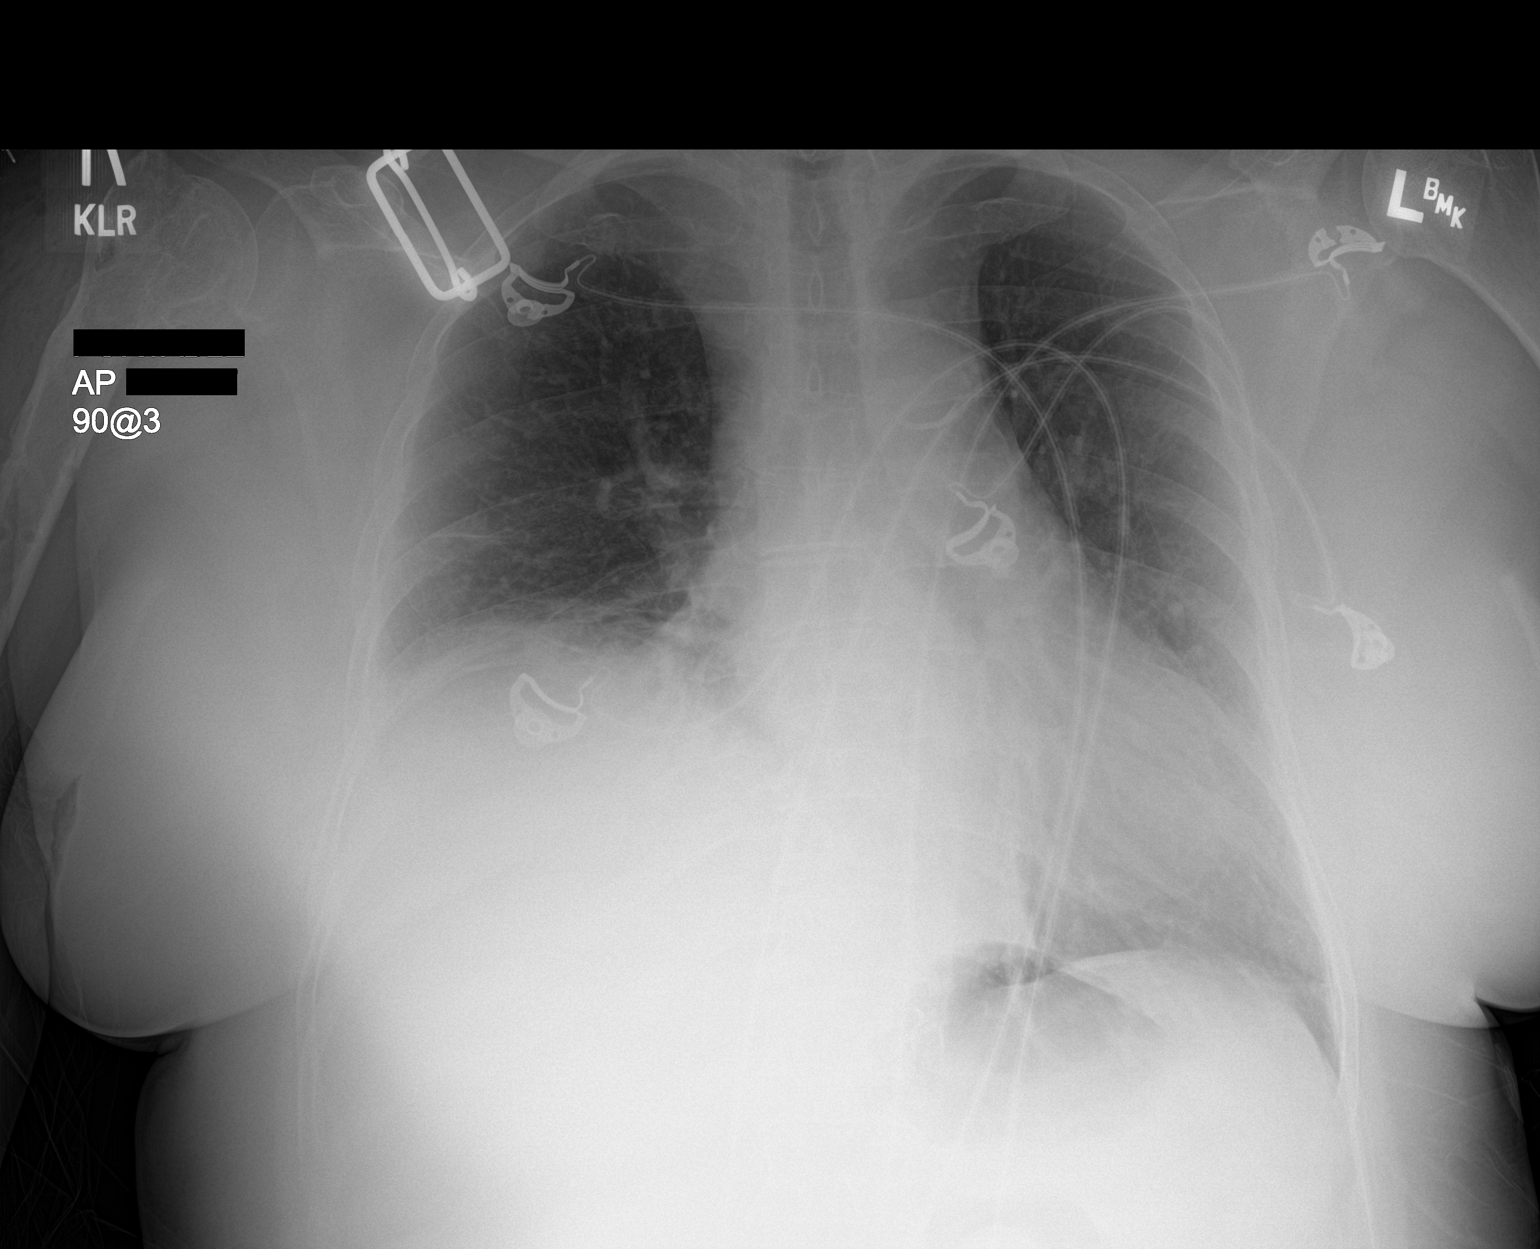

[1 of 1 positions shown; findings below may reference images not displayed]

FINDINGS: Removal of surgical hardware from the right humerus with cutaneous
staples over the right shoulder. New elevation of the right
diaphragm with atelectasis at the right base. Borderline heart size.
No pleural effusion. Aortic atherosclerosis. No pneumothorax.
IMPRESSION: New elevation of the right diaphragm with atelectasis at the right
base. Removal of surgical hardware from the visible right humerus.

## 2020-02-14 ENCOUNTER — Telehealth: Payer: Self-pay | Admitting: Neurology

## 2020-02-14 MED ORDER — UBRELVY 50 MG PO TABS: 50.0000 mg | ORAL_TABLET | Freq: Every day | ORAL | 3 refills | Status: DC | PRN

## 2020-02-14 NOTE — Addendum Note (Signed)
Addended by: York Spaniel on: 02/14/2020 05:09 PM   Modules accepted: Orders

## 2020-02-14 NOTE — Telephone Encounter (Signed)
I called the patient and left a message.  Prior records indicated that she was placed on Nurtec, if she is on this medication I would not prescribe Bernita Raisin, the patient will let me know what she is on.

## 2020-02-14 NOTE — Telephone Encounter (Signed)
The patient was given a prescription for Nurtec but apparently never picked up the prescription.  She claims that she has been on Ubrelvy in the past and it has been effective.  I will send in the prescription for the Ubrelvy.

## 2020-02-14 NOTE — Telephone Encounter (Signed)
Pt has called to report that she was told by the pharmacy to reach out to Dr Anne Hahn re:a prior authorization being needed for her Bernita Raisin. Please call

## 2020-02-14 NOTE — Telephone Encounter (Signed)
Pt returned call stating that she has stopped the Nurtec 2 weeks ago due to it making her wake up every morning "stuffed up". Please advise.

## 2020-02-14 NOTE — Telephone Encounter (Signed)
DR Anne Hahn pt was previously seeing Dr Sherryll Burger neurologist in [place].We saw pt for paresthesia on 12/08/2019, and pt saw her previous neurologist for a video visit on 01/05/2020. Pt is calling for [place] to PA on ubrelvy.PT wanted to transfer care here but Is still calling the previous neurologist. I dont see Bernita Raisin on medication list.Please advise thanks

## 2020-02-15 ENCOUNTER — Other Ambulatory Visit: Payer: Self-pay

## 2020-02-15 MED ORDER — UBRELVY 50 MG PO TABS: 50.0000 mg | ORAL_TABLET | Freq: Every day | ORAL | 3 refills | Status: DC | PRN

## 2020-02-15 NOTE — Telephone Encounter (Signed)
Pt called stating that her Ubrogepant (UBRELVY) 50 MG TABS was sent to the wrong pharmacy. Pt needs it sent to the [facility] on [address]. And it is needing a PA. Please advise.

## 2020-02-15 NOTE — Telephone Encounter (Signed)
PA initiated for [place] [facility] [place] [address] at  [phone]. This is a new medication for pt. PA number for reference is [id number]. They stated to call back after 24 hours of decision.

## 2020-02-15 NOTE — Telephone Encounter (Signed)
If patient calls back ubrelvy was sent to [facility] on [address]. The PA will get done. It will take 24 to 72 hours for medicaid to review if they will approve or deny medication. Please let pt know that thanks.

## 2020-02-16 NOTE — Telephone Encounter (Signed)
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 You might want to resubmit the request with this information.   Routing comment

## 2020-02-16 NOTE — Telephone Encounter (Signed)
I called [place] [facility] and ubrelvy was denied. Pt mus try and failed two preferred triptans. PT PA pt has only failed and tried imitrex. PT will have to try maxalt which is the preferred. Message will be sent over to Dr. Anne Hahn to review if he wants to prescribed maxalt.

## 2020-02-16 NOTE — Telephone Encounter (Signed)
I called [place] [facility] again to resubmit PA. I stated per MD pt cannot take triptans due to history of prior hemorrhage in left temporoparietal area use of triptan is contradicted.Other PA questions were answered. PA number for call is [phone].

## 2020-02-17 NOTE — Telephone Encounter (Signed)
I called pt that PA are done with her insurance not the pharmacy. I stated it was submitted twice to [place] [facility]. I advise pt that a call will be made this evening if it was denied or approve. She verbalized understanding.

## 2020-02-17 NOTE — Telephone Encounter (Signed)
Pt returned call and LVM. States a PA form needs to be faxed over to the pharmacy in order for her to be able to pick up her Bernita Raisin. Please advise.

## 2020-02-18 ENCOUNTER — Telehealth: Payer: Self-pay | Admitting: Neurology

## 2020-02-18 MED ORDER — DEXAMETHASONE 2 MG PO TABS: 2.0000 mg | ORAL_TABLET | Freq: Two times a day (BID) | ORAL | 0 refills | Status: DC

## 2020-02-18 NOTE — Telephone Encounter (Signed)
I called the patient.  The patient was calling about the Bernita Raisin, he was initially denied, but we are going to resubmit the prior authorization form.  The patient has had a headache over the last 2 days, I will give her a short course of dexamethasone.  The patient cannot take aspirin and she cannot tolerate Compazine or promethazine.

## 2020-02-22 NOTE — Telephone Encounter (Signed)
I called pt that Stephanie [name] was approve by [place] [facility]. Pt verbalized understanding.

## 2020-02-22 NOTE — Telephone Encounter (Signed)
I called [place] [facility] and medication was approve from 02/22/2020 to 02/16/21. PA reference number is [phone].

## 2020-03-16 IMAGING — CR DG CHEST 2V
2 series · 2 of 2 positions shown · non-contrast
Comparison: December 22, 2017

CLINICAL DATA: Status post fall a few days ago with back pain

EXAM:
CHEST - 2 VIEW

[w chest lat]
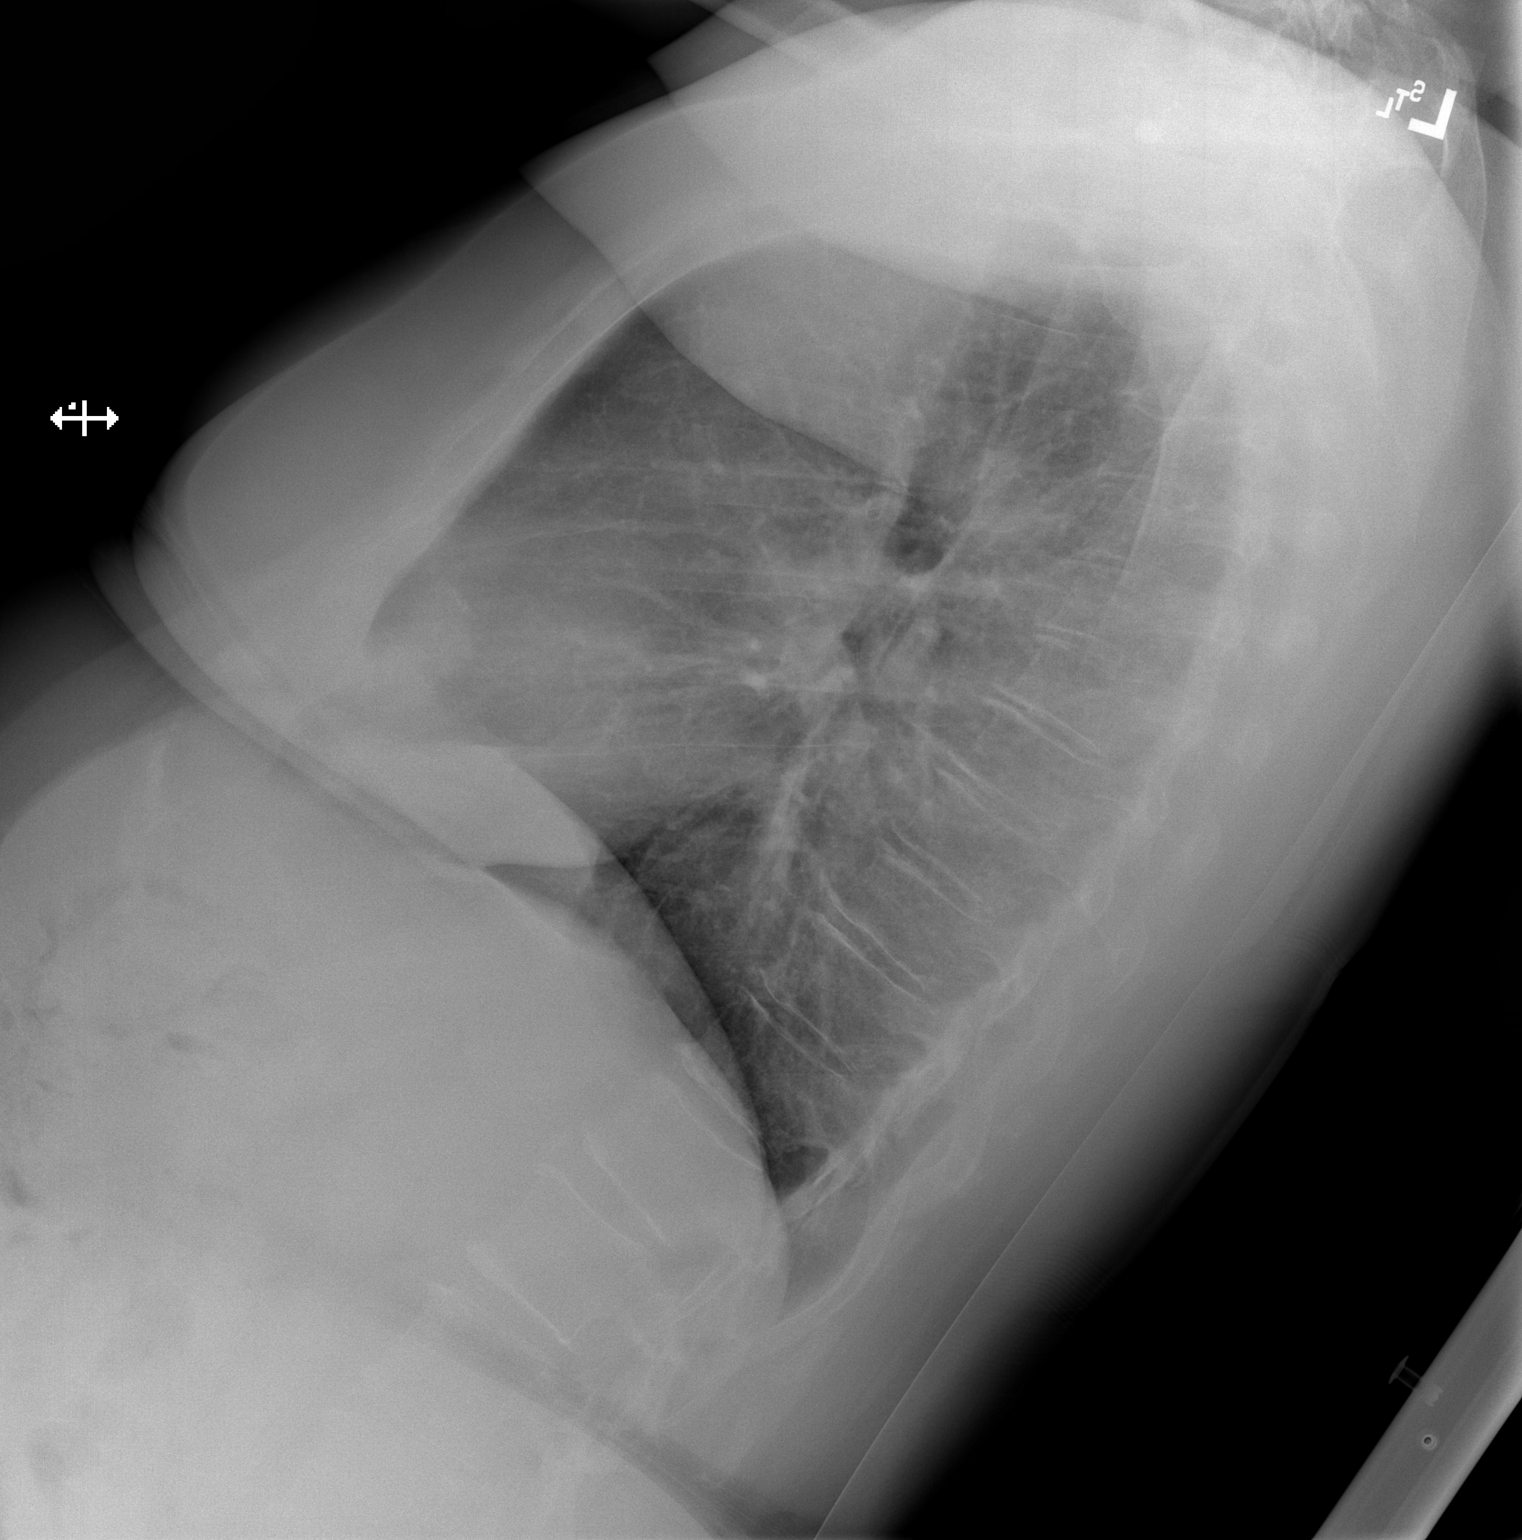

[x chest ap]
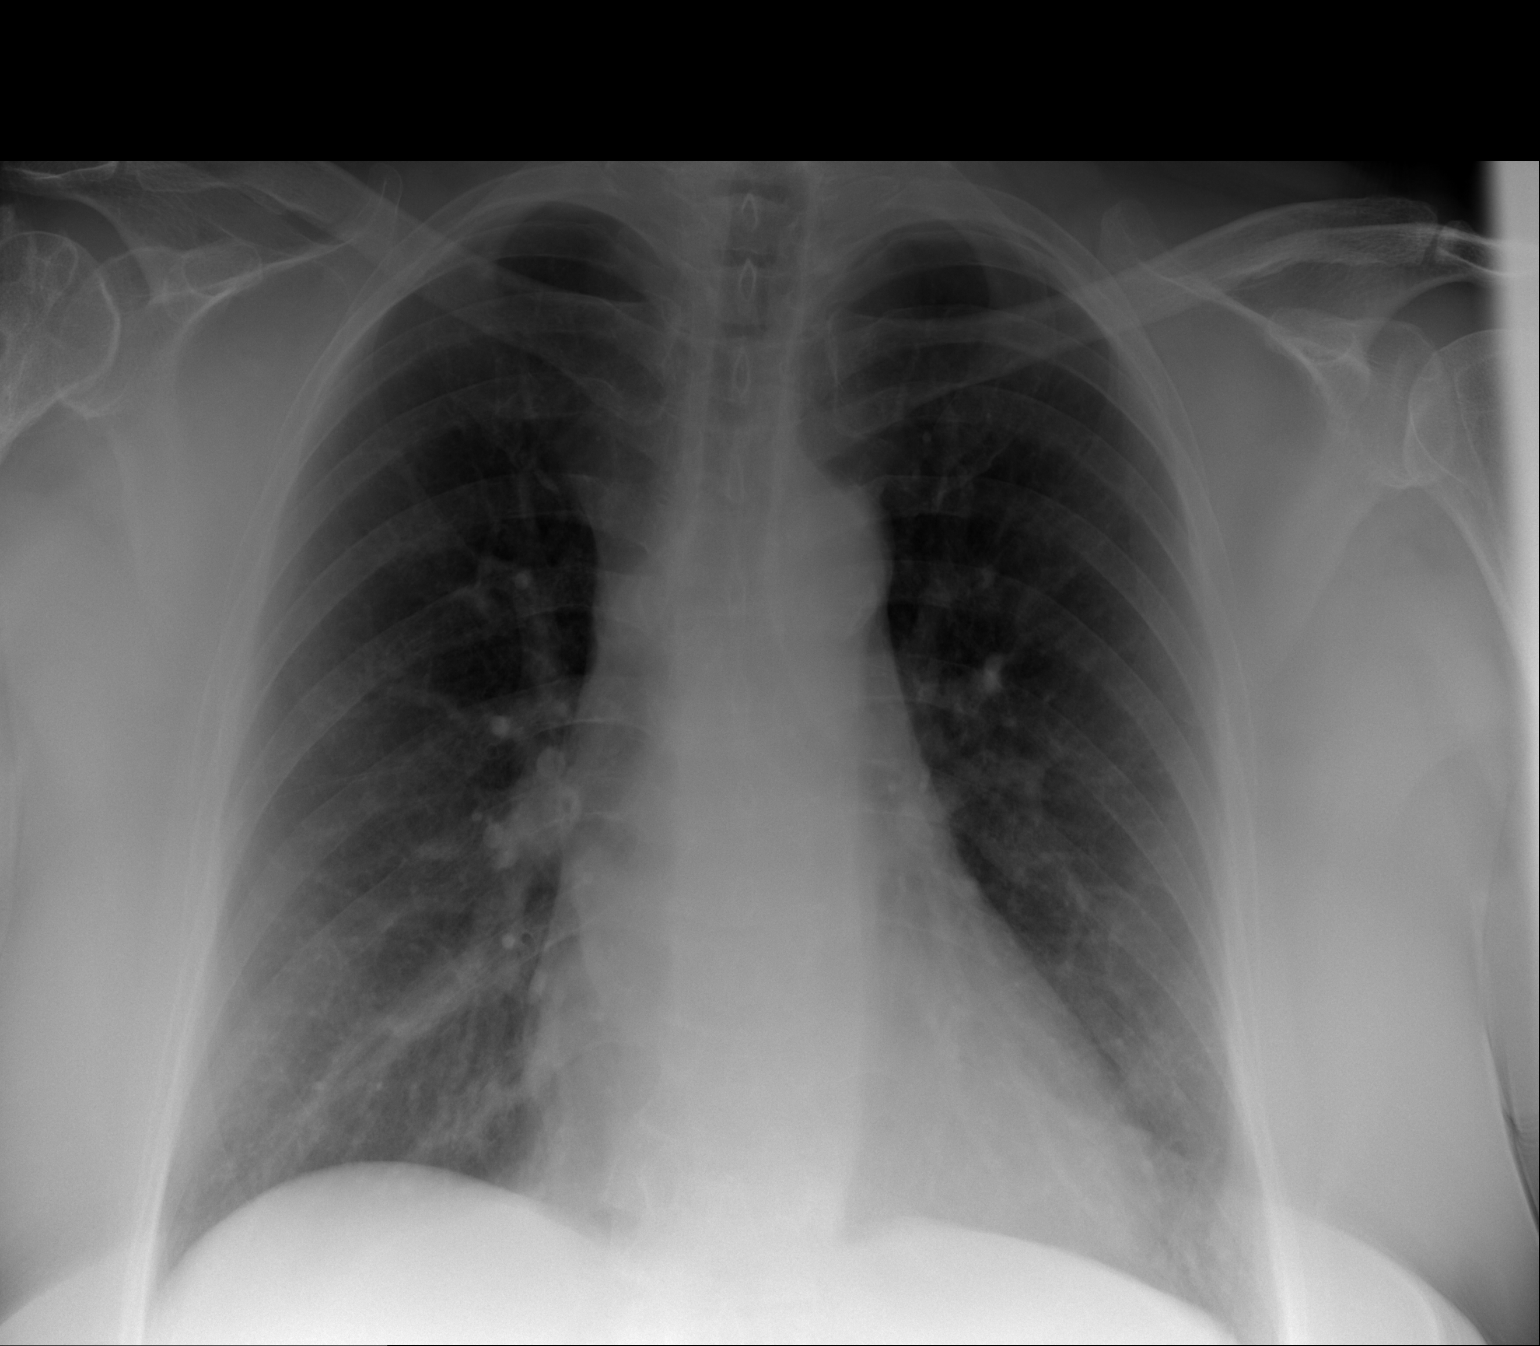

[2 of 2 positions shown; findings below may reference images not displayed]

FINDINGS: The heart size and mediastinal contours are stable. There is no
focal infiltrate, pulmonary edema, or pleural effusion. Mild
degenerative joint changes of the spine are noted. Chronic deformity
of the proximal right humerus is unchanged.
IMPRESSION: No active cardiopulmonary disease.

## 2020-03-16 IMAGING — CR DG THORACIC SPINE 2V
2 series · 2 of 2 positions shown · non-contrast
Comparison: Chest x-ray April 11, 2017

CLINICAL DATA: Status post fall few days ago with back pain

EXAM:
THORACIC SPINE 2 VIEWS

[t thoracic spine ap]
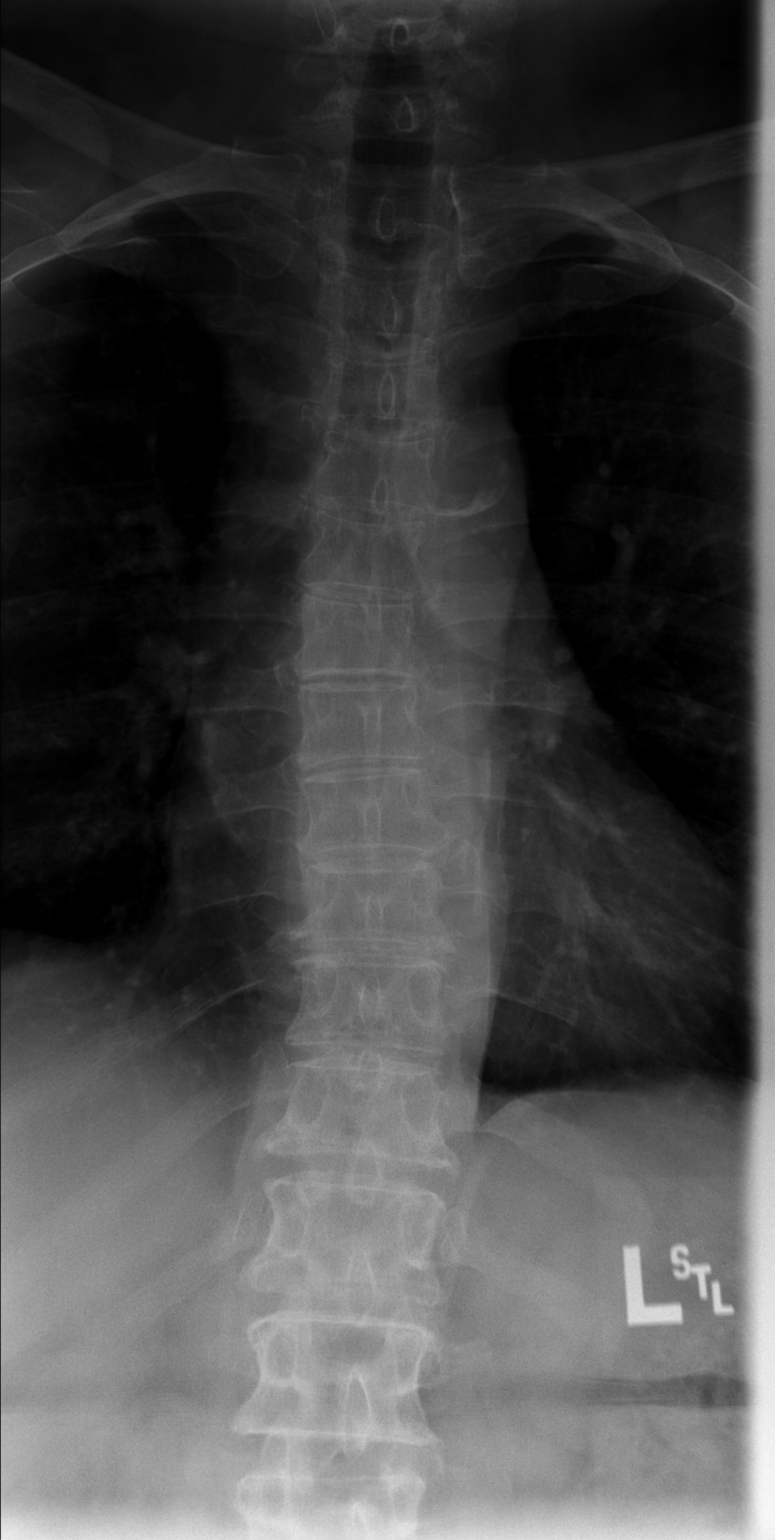

[t thoracic spine lat]
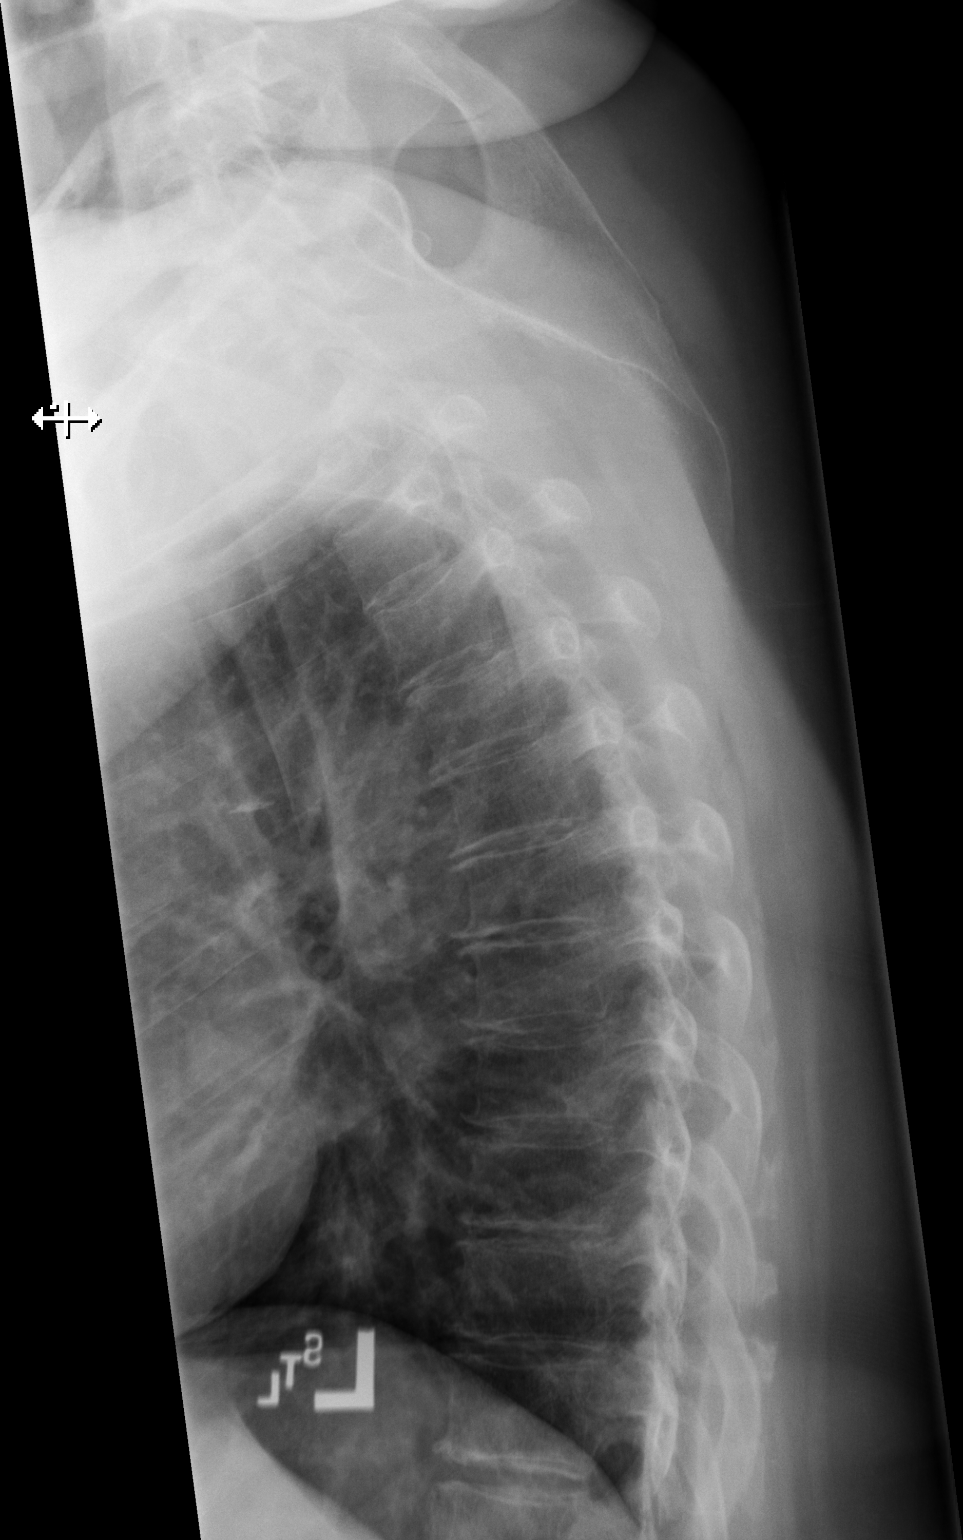

[2 of 2 positions shown; findings below may reference images not displayed]

FINDINGS: There is no evidence of thoracic spine fracture. Scoliosis of the
spine is noted. Degenerative joint changes with anterior
osteophytosis and narrowed joint space are identified.
IMPRESSION: No acute fracture or dislocation. Osteoarthritic changes of thoracic
spine.

## 2020-03-16 IMAGING — CR DG LUMBAR SPINE COMPLETE 4+V
5 series · 5 of 5 positions shown · non-contrast
Comparison: None.

CLINICAL DATA: Status post fall few days ago with back pain.

EXAM:
LUMBAR SPINE - COMPLETE 4+ VIEW

[t lumbar spine ap]
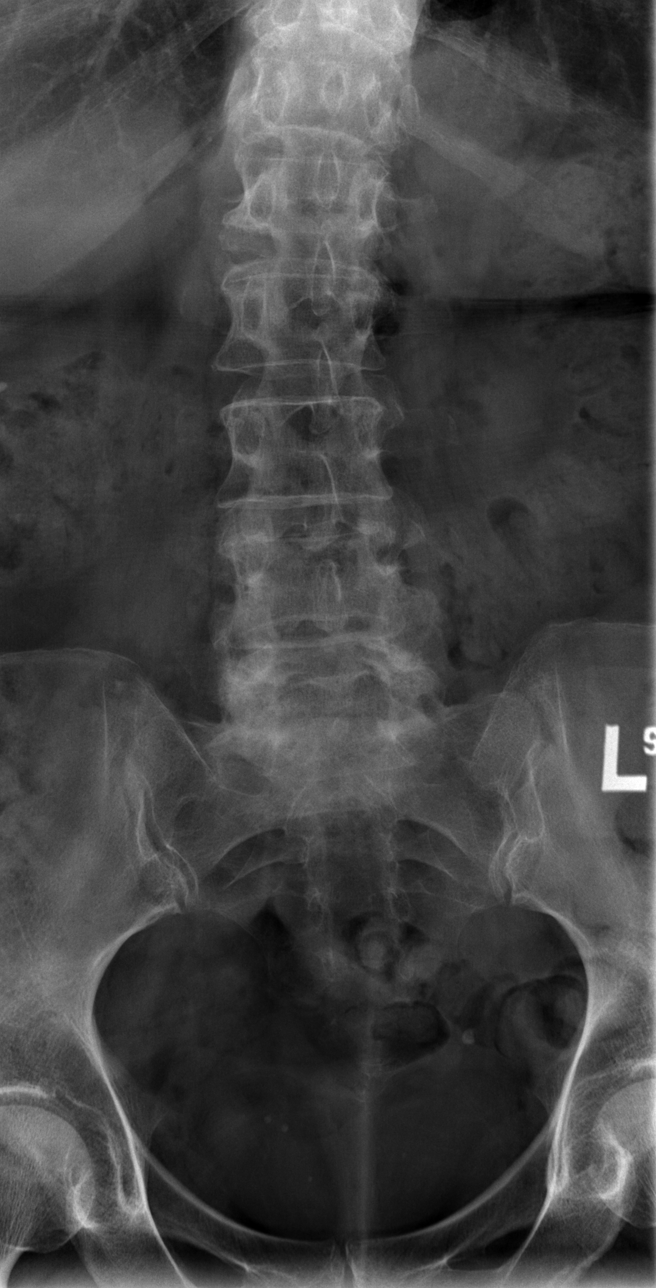

[t lumbar spine obl (1 of 2)]
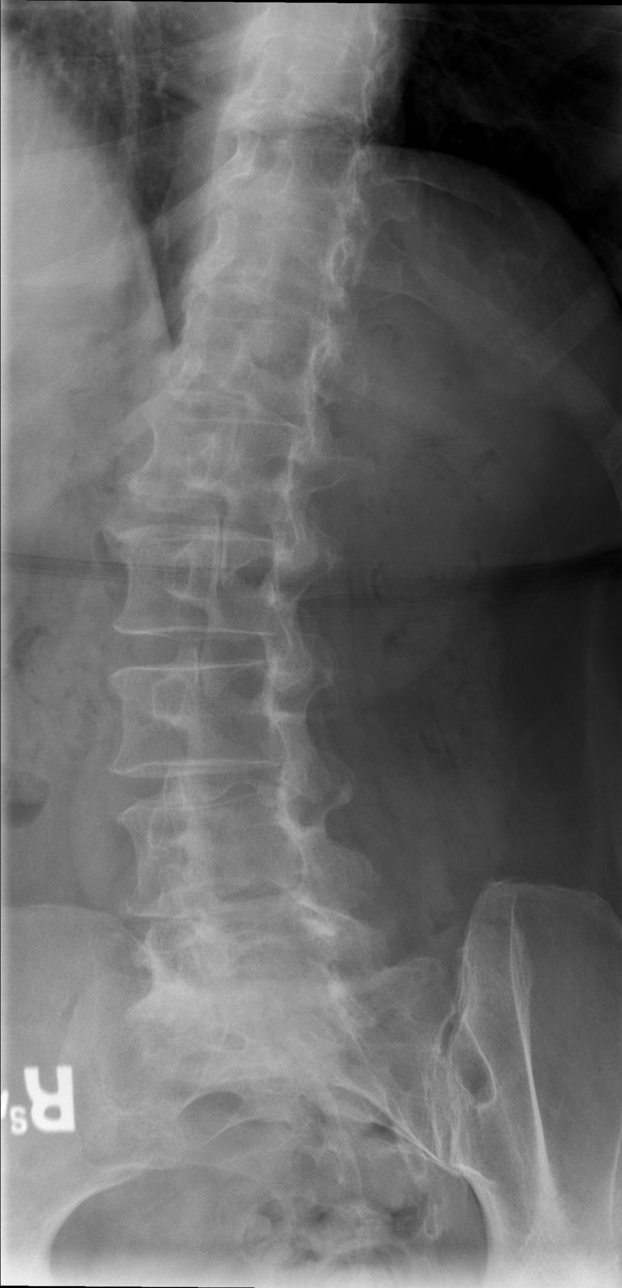

[t lumbar spine obl (2 of 2)]
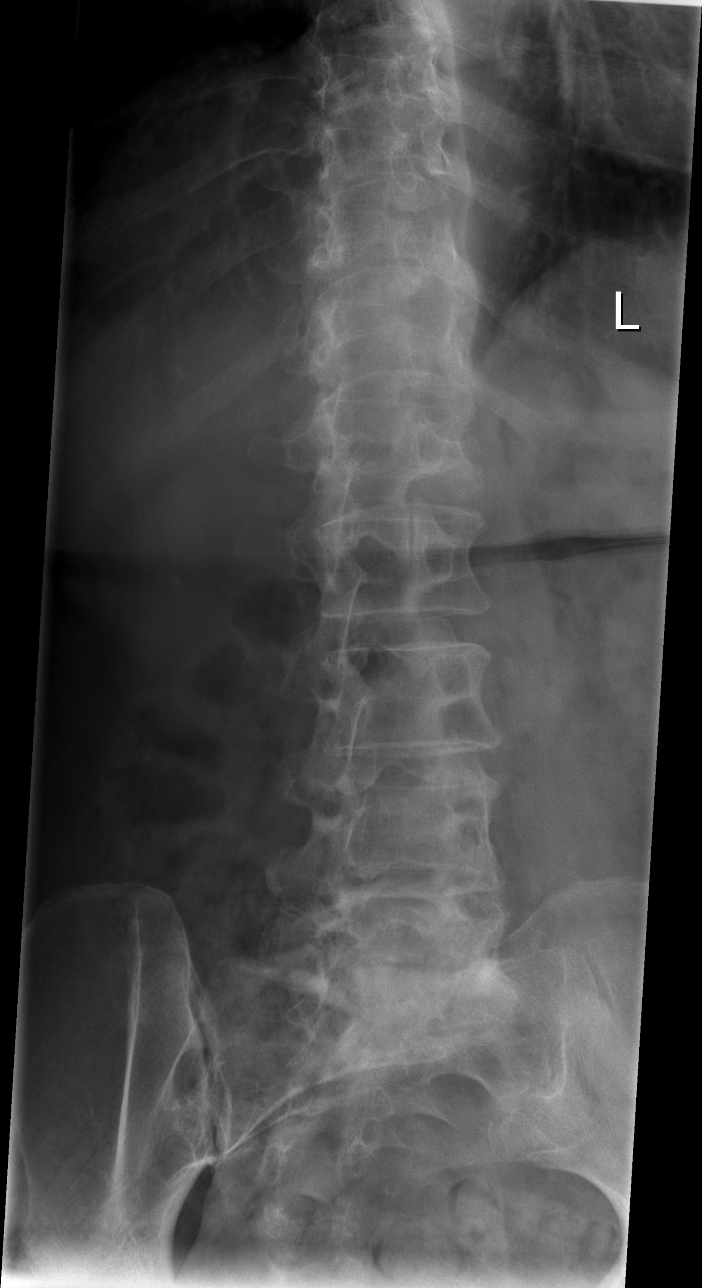

[t lumbar spine lat]
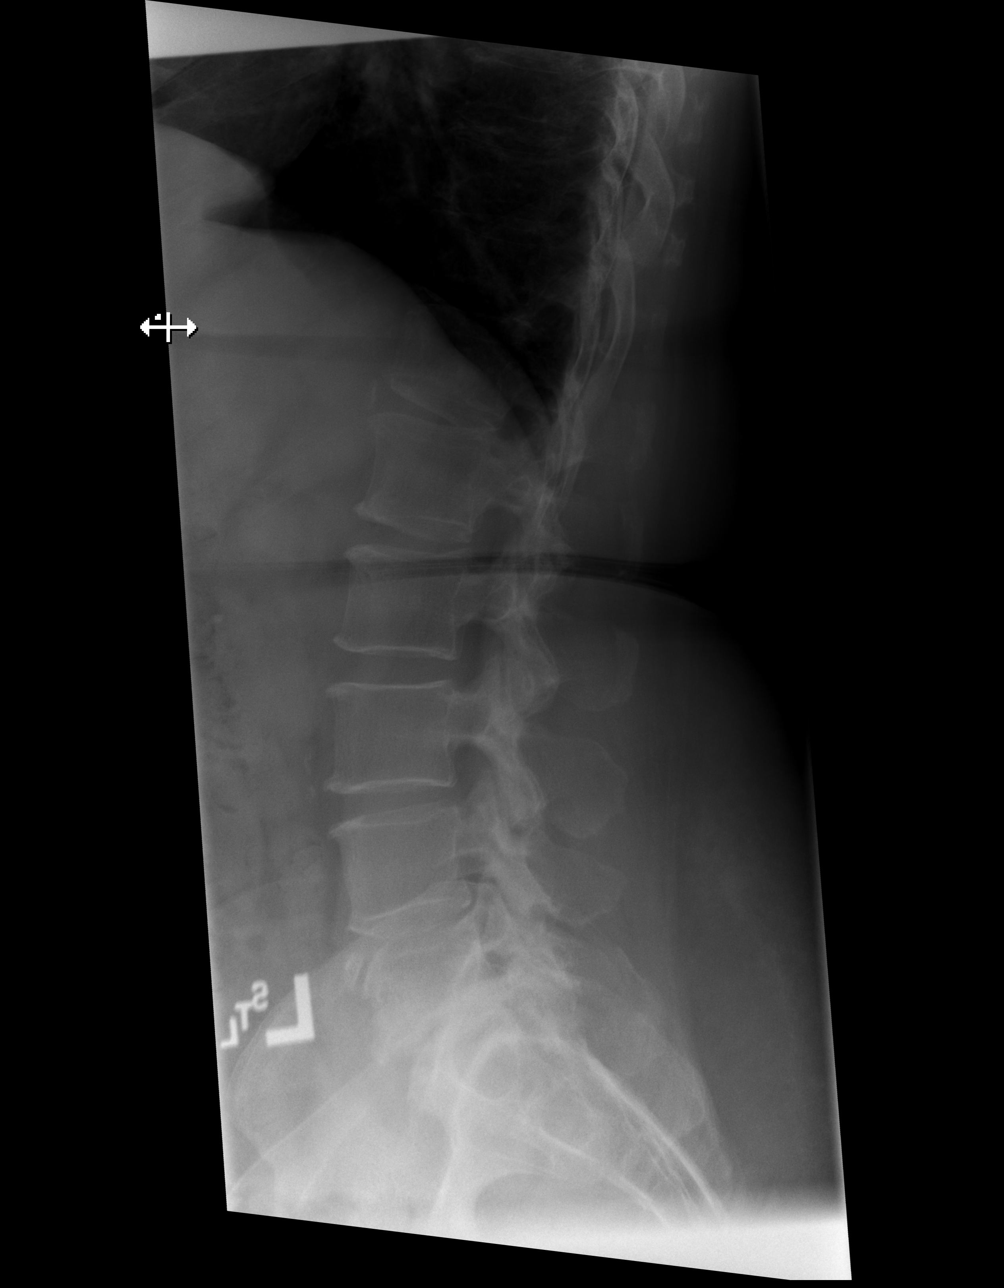

[t lumbar l-5 s-1 spot]
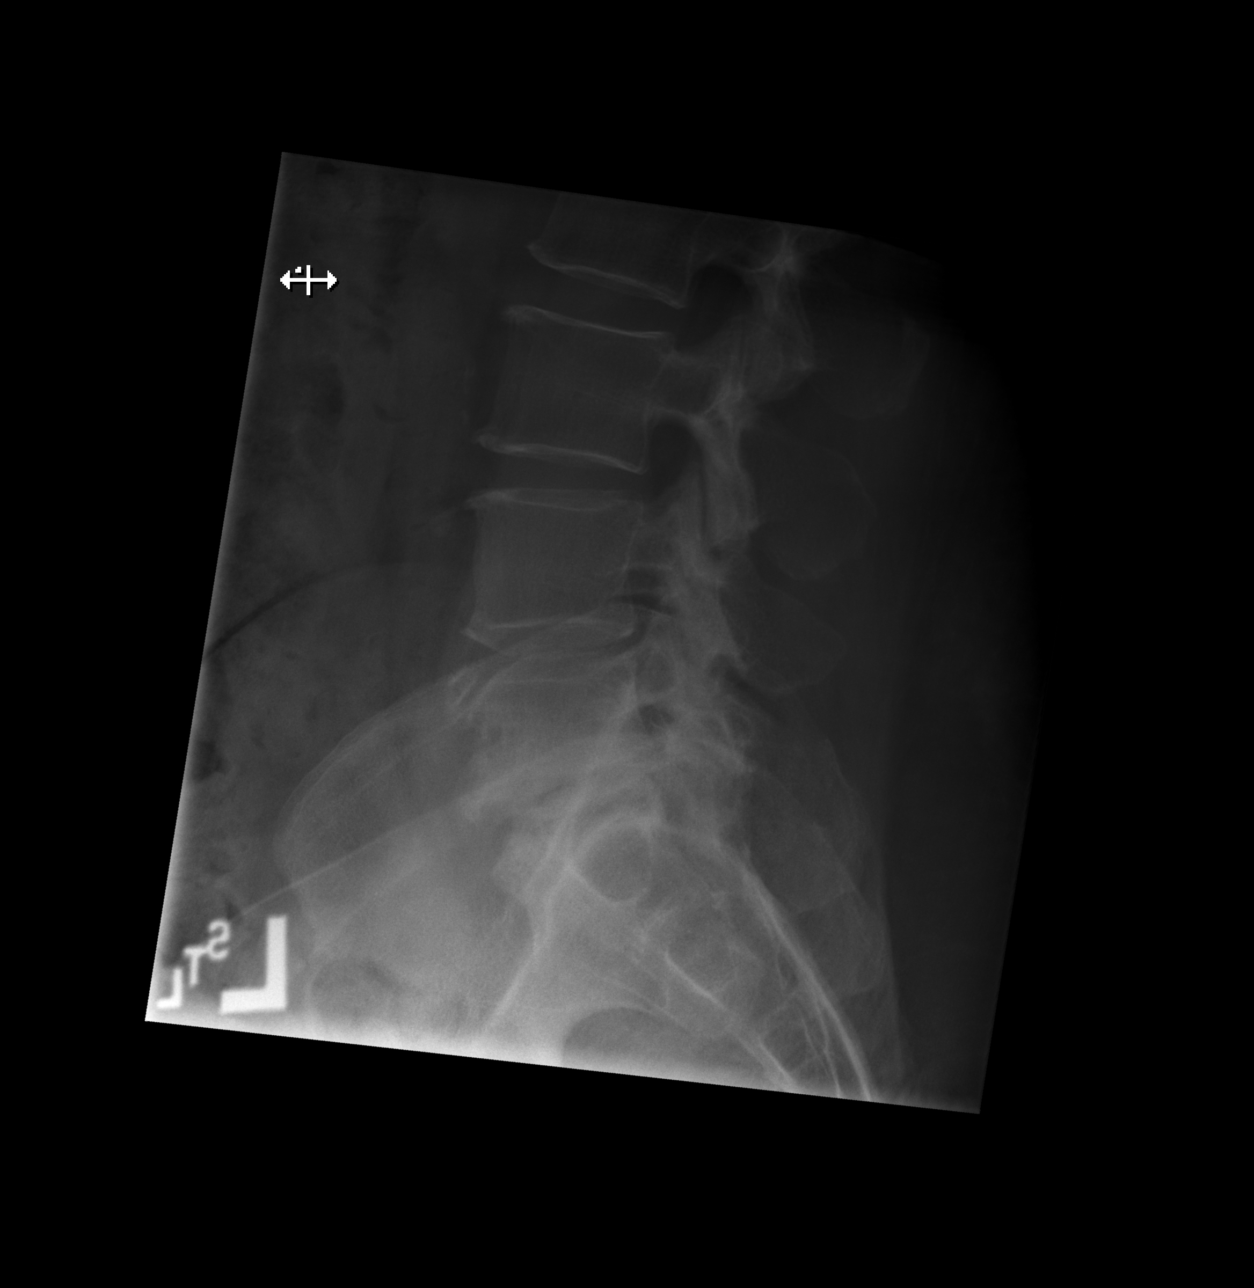

[5 of 5 positions shown; findings below may reference images not displayed]

FINDINGS: There is no evidence of lumbar spine fracture. Degenerative joint
changes of the mid to lower lumbar spine are noted. There is
scoliosis of spine.
IMPRESSION: No acute fracture or dislocation.

## 2020-03-16 IMAGING — CR DG SCAPULA*L*
2 series · 2 of 2 positions shown · non-contrast
Comparison: None.

CLINICAL DATA: Status post fall a few days ago with back pain.

EXAM:
LEFT SCAPULA - 2+ VIEWS

[t scapula ap left]
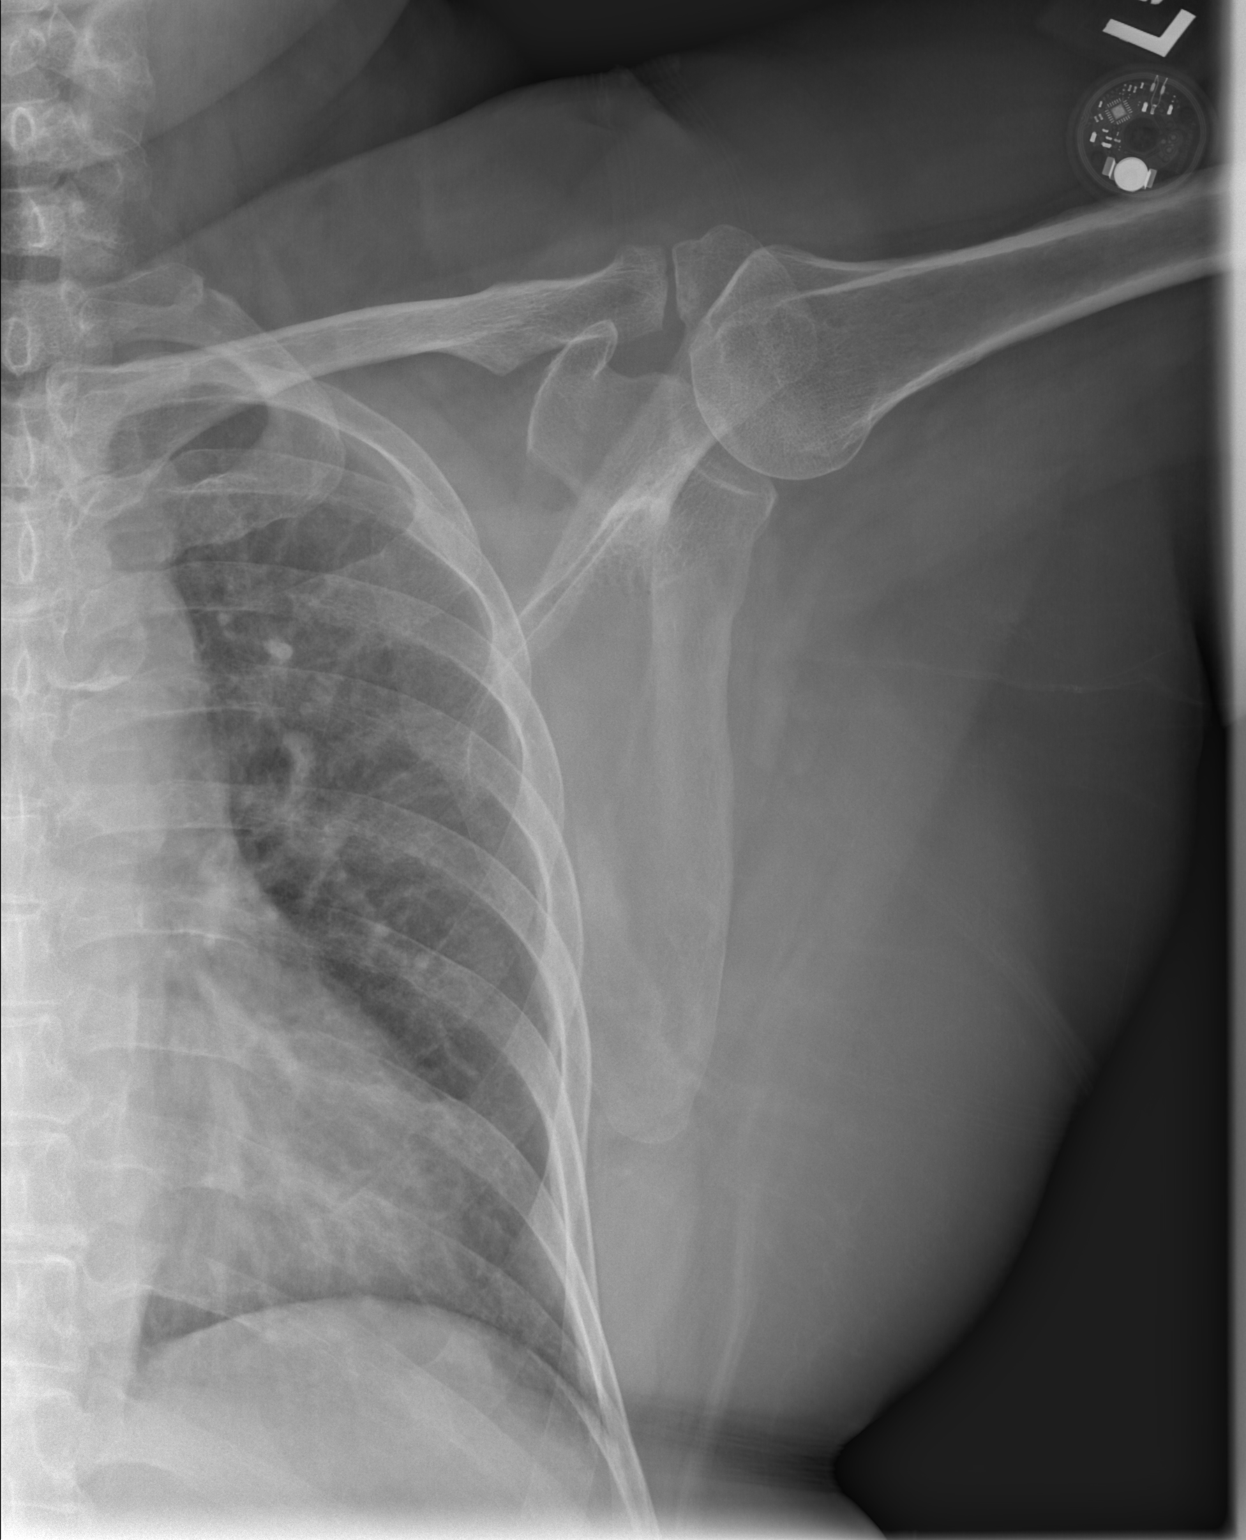

[t scapula y-view left]
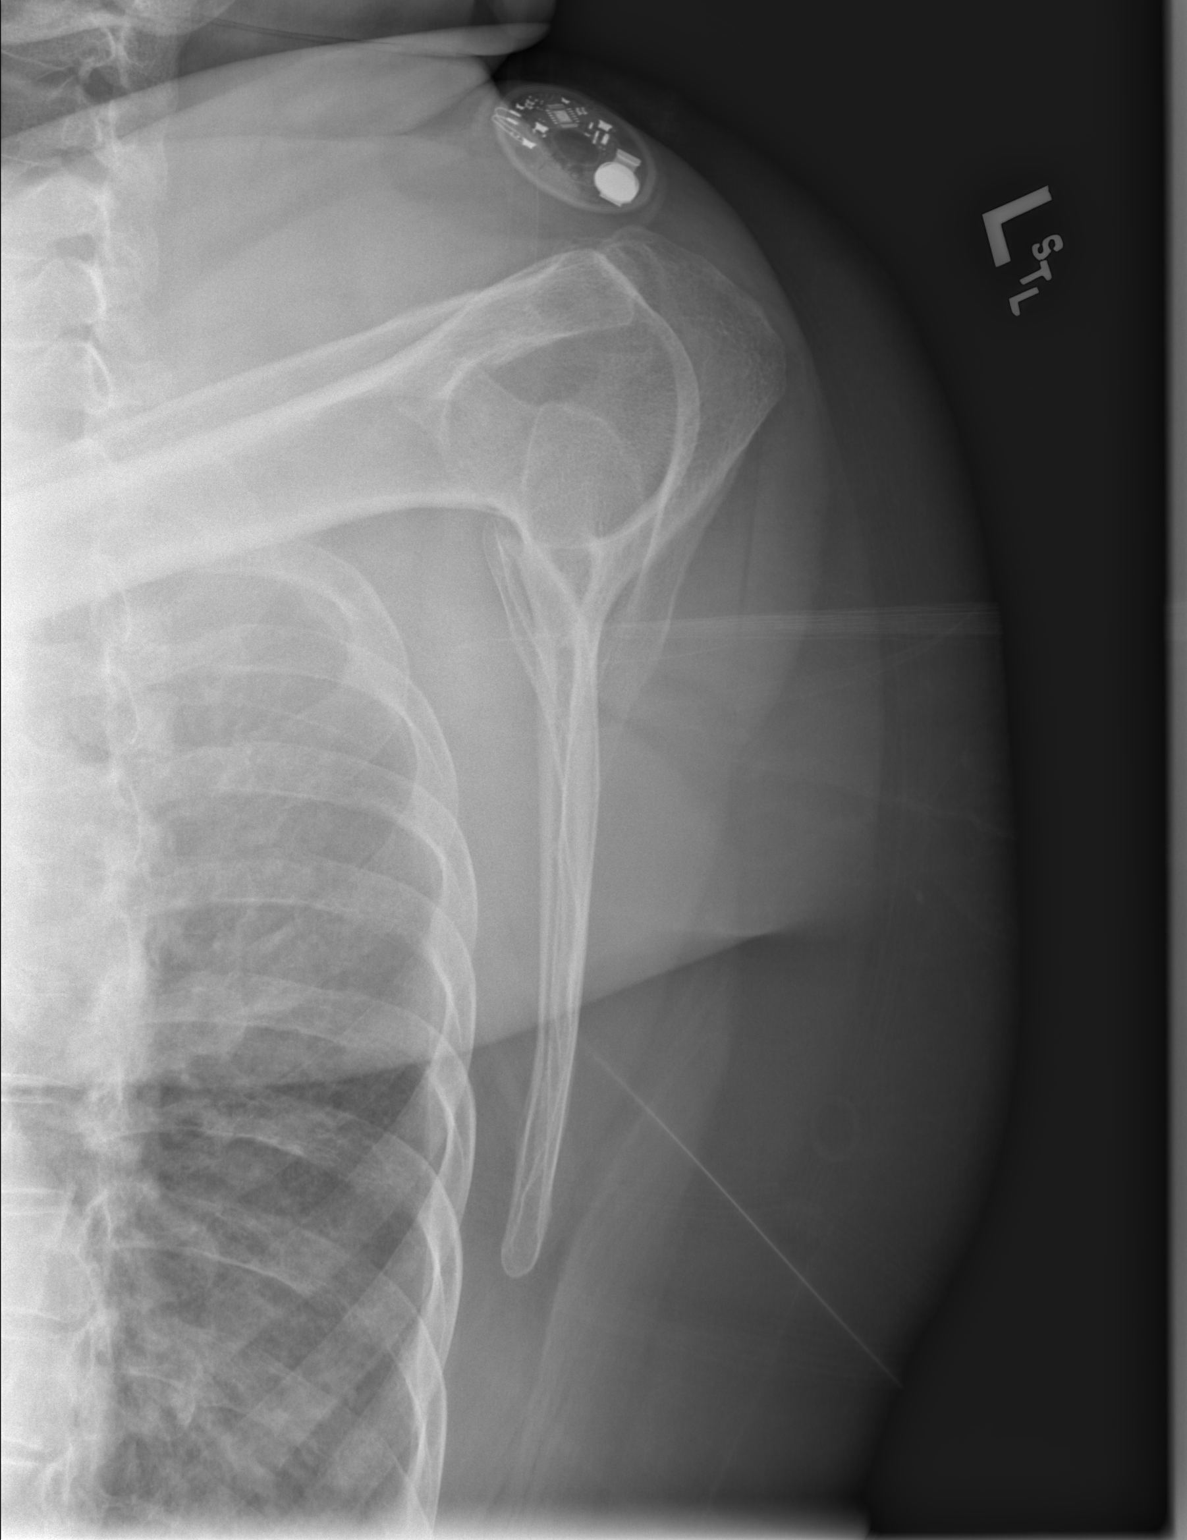

[2 of 2 positions shown; findings below may reference images not displayed]

FINDINGS: There is no evidence of fracture or dislocation. Soft tissues are
unremarkable.
IMPRESSION: Negative.

## 2020-03-16 IMAGING — CT CT RENAL STONE PROTOCOL
2 of 4 series · 17 of 46 positions shown, 19 images · non-contrast
Comparison: February 11, 2018

CLINICAL DATA: Status post fall few days ago with back pain and
bruise on her back.

EXAM:
CT ABDOMEN AND PELVIS WITHOUT CONTRAST
TECHNIQUE: Multidetector CT imaging of the abdomen and pelvis was performed
following the standard protocol without IV contrast.

[Series 2: axial st · axial · 0.88mm/px · z∈[-630,-225]mm · 14 of 93 slices shown, 16 images]
[im 6/93  soft-tissue]
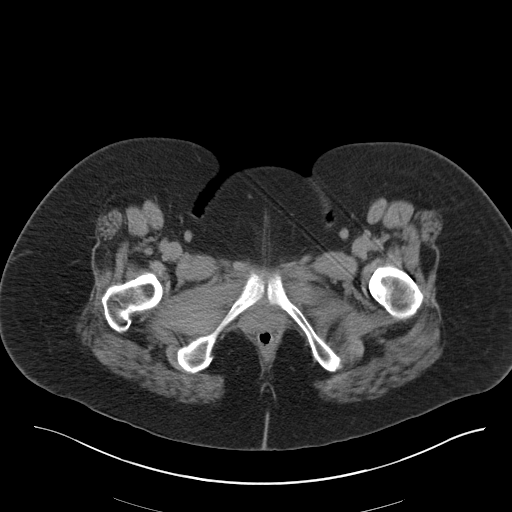
[im 6/93  bone]
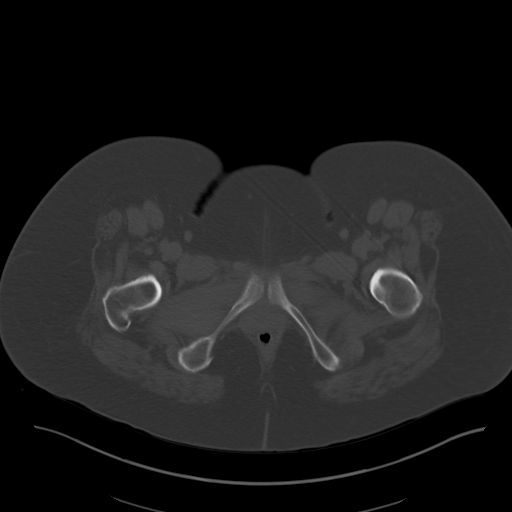
[im 12/93  soft-tissue]
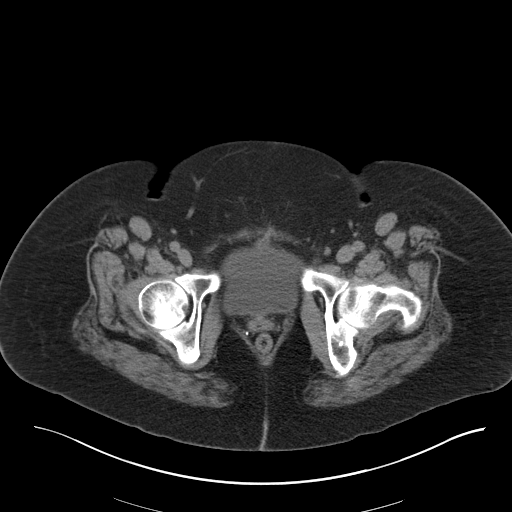
[im 18/93  soft-tissue]
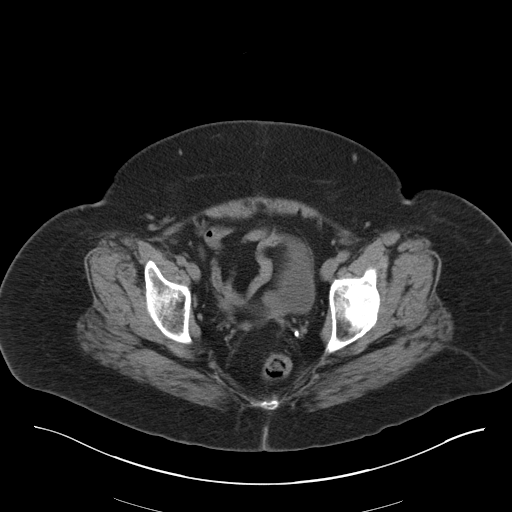
[im 24/93  soft-tissue]
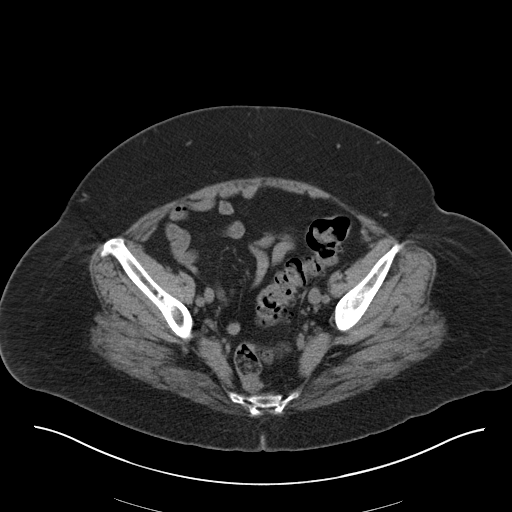
[im 29/93  soft-tissue]
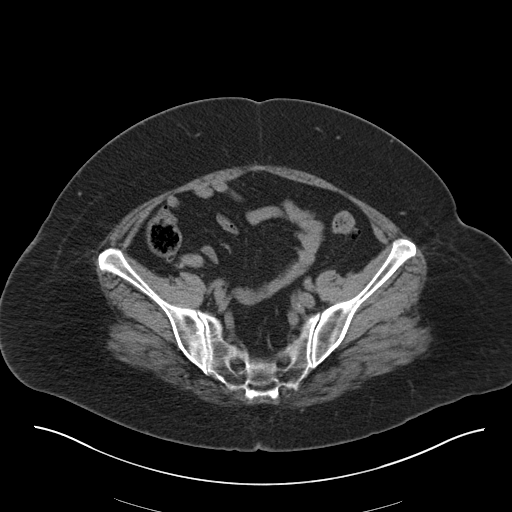
[im 35/93  soft-tissue]
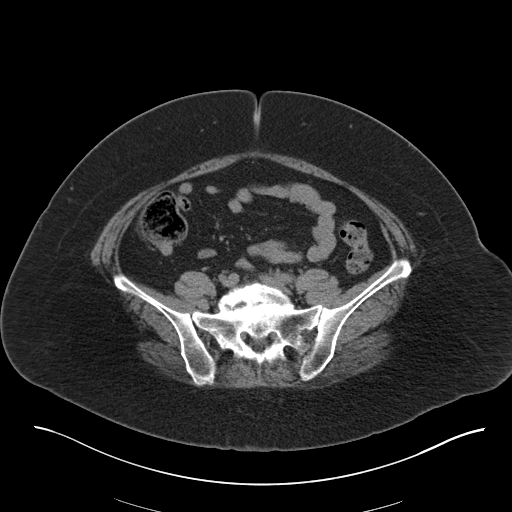
[im 41/93  soft-tissue]
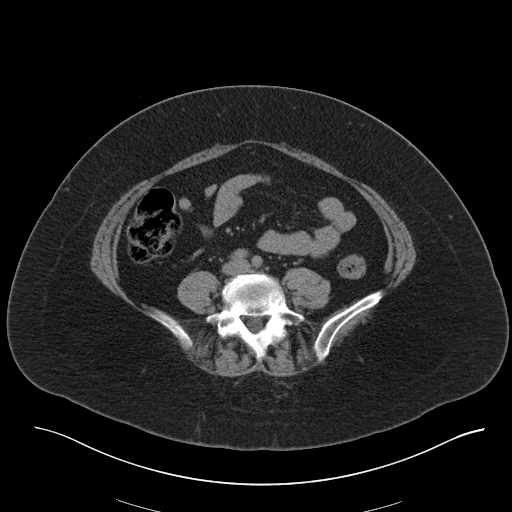
[im 52/93  soft-tissue]
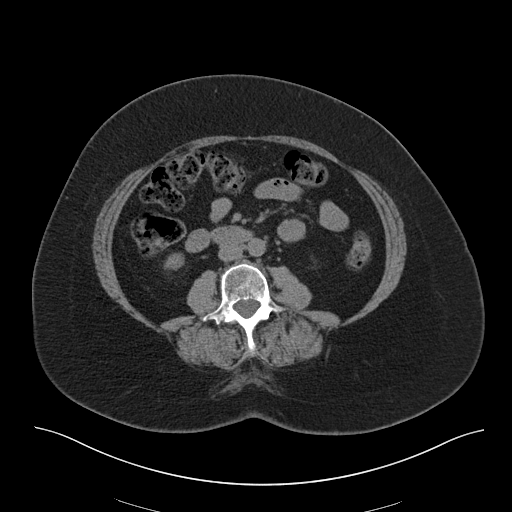
[im 58/93  soft-tissue]
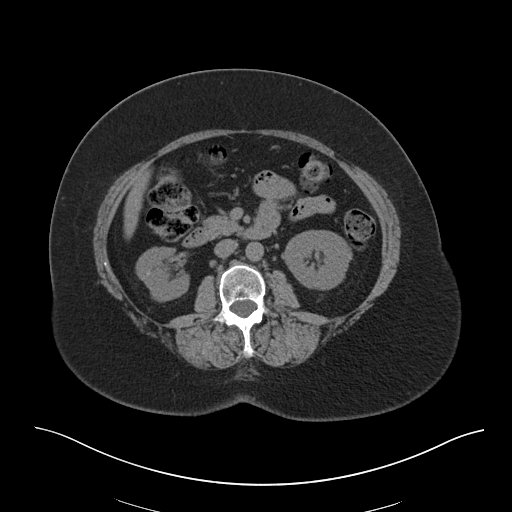
[im 58/93  bone]
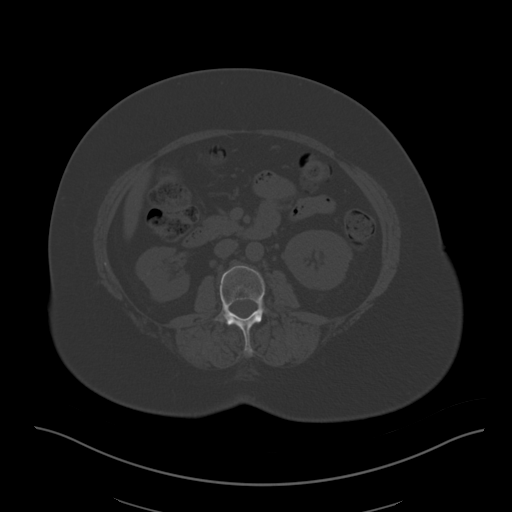
[im 64/93  soft-tissue]
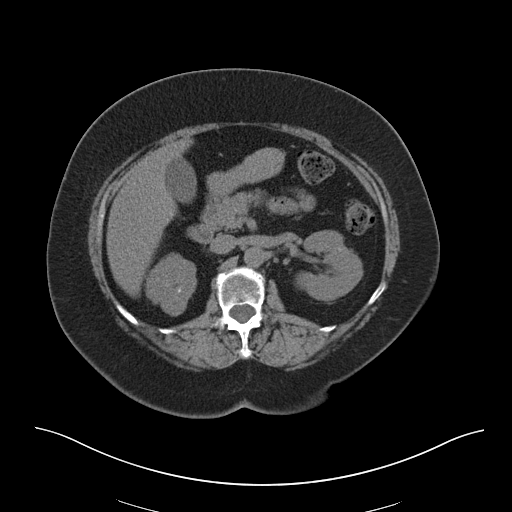
[im 70/93  soft-tissue]
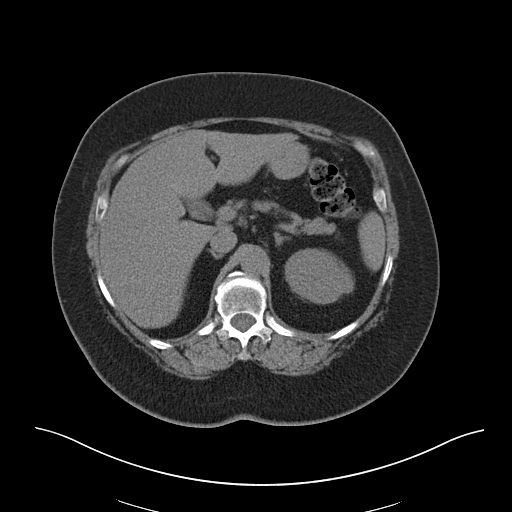
[im 75/93  soft-tissue]
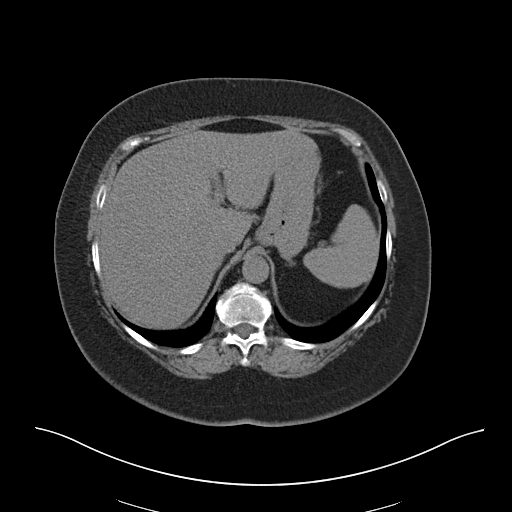
[im 81/93  soft-tissue]
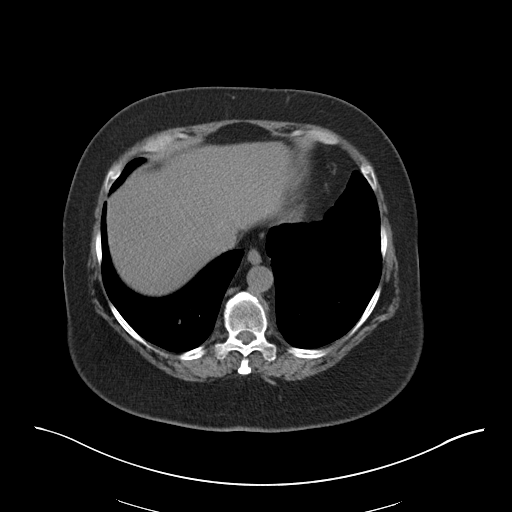
[im 87/93  soft-tissue]
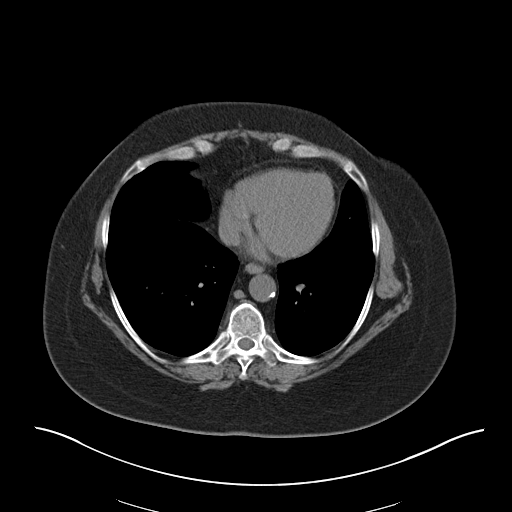

[Series 5: coronal · coronal · 0.88mm/px · 3 of 182 slices shown]
[im 61/182  soft-tissue]
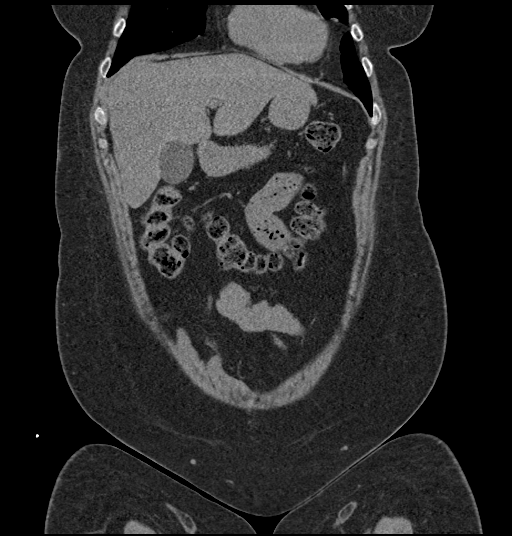
[im 81/182  soft-tissue]
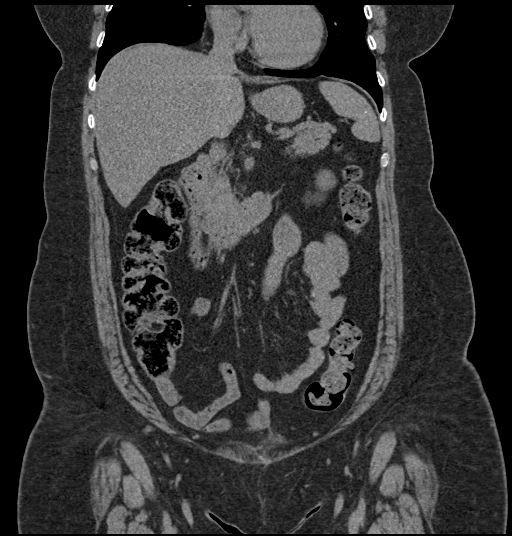
[im 101/182  soft-tissue]
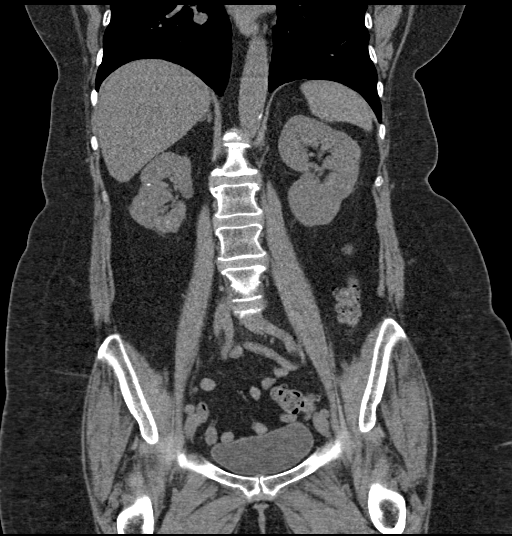

[17 of 46 positions shown; findings below may reference images not displayed]

FINDINGS: Lower chest: No acute abnormality.

Hepatobiliary: No focal liver abnormality is seen. No gallstones,
gallbladder wall thickening, or biliary dilatation.

Pancreas: Unremarkable. No pancreatic ductal dilatation or
surrounding inflammatory changes.

Spleen: Normal in size without focal abnormality.

Adrenals/Urinary Tract: The bilateral adrenal glands are normal.
Scarring of bilateral kidneys are noted. There are nonobstructing
stones in both kidneys. There is no hydronephrosis bilaterally.
Bladder is normal.

Stomach/Bowel: Stomach is within normal limits. Appendix appears
normal. No evidence of bowel wall thickening, distention, or
inflammatory changes.

Vascular/Lymphatic: Aortic atherosclerosis. No enlarged abdominal or
pelvic lymph nodes.

Reproductive: Status post hysterectomy. No adnexal masses.

Other: None.

Musculoskeletal: Degenerative joint changes of the spine are noted.
No acute fracture or dislocation is identified.
IMPRESSION: No acute abnormalities identified in the abdomen and pelvis.

Degenerative joint changes of the lumbar spine without evidence of
acute fracture dislocation. The soft tissues of the back is
unremarkable.

Scarring of both kidneys with bilateral nonobstructing stones in the
kidneys.

## 2020-03-16 IMAGING — CR DG SCAPULA*R*
2 series · 2 of 2 positions shown · non-contrast
Comparison: January 18, 2017

CLINICAL DATA: Status post fall a few days ago with back pain.

EXAM:
RIGHT SCAPULA - 2+ VIEWS

[t scapula ap right]
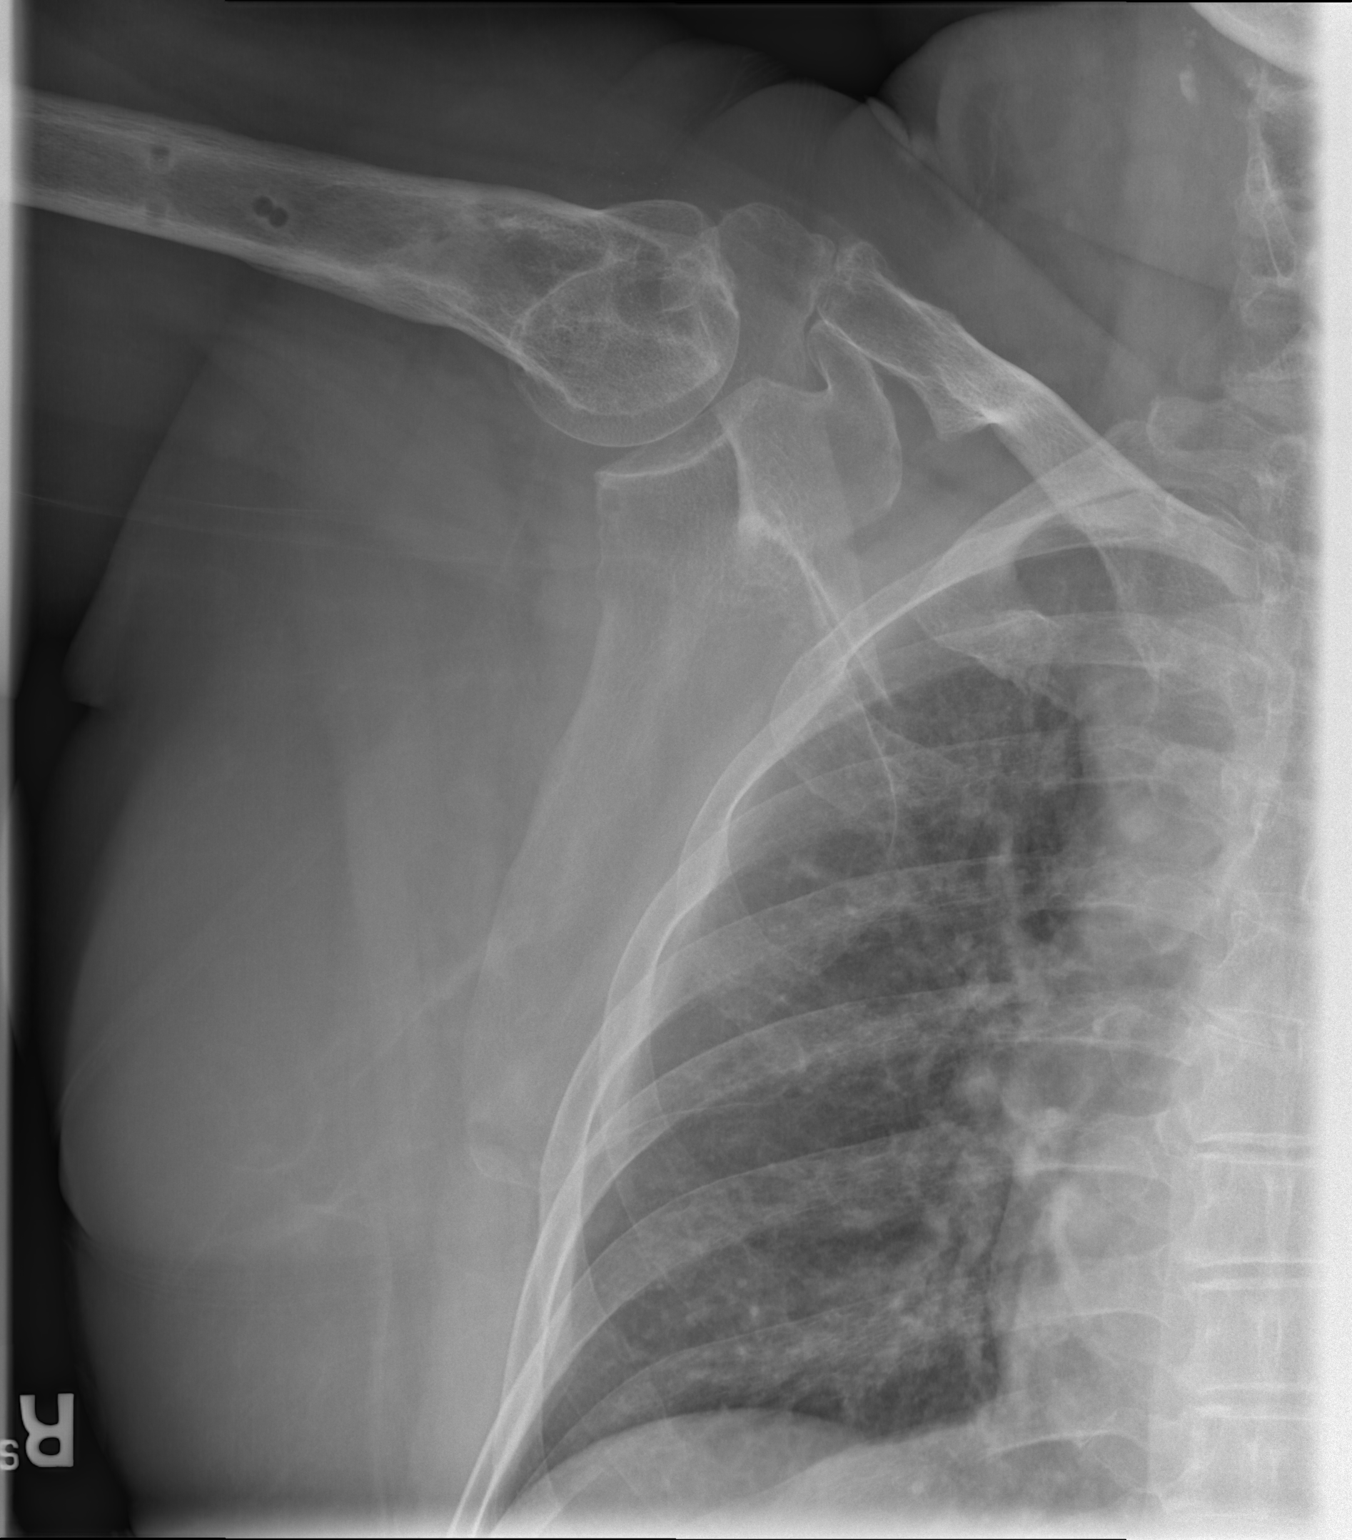

[t scapula y-view right]
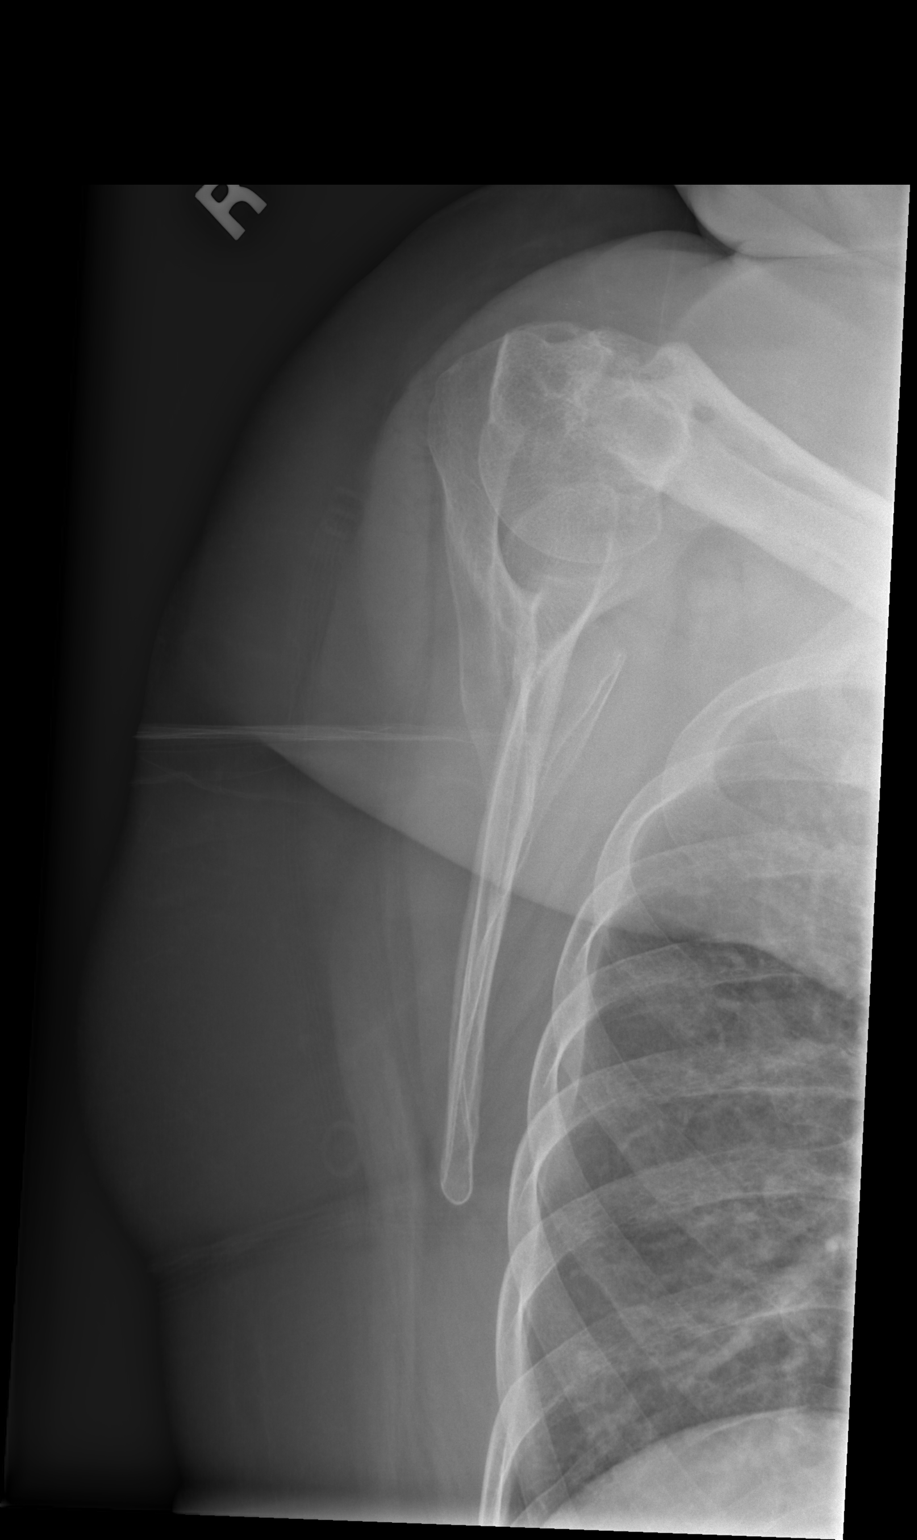

[2 of 2 positions shown; findings below may reference images not displayed]

FINDINGS: There is no evidence of fracture or dislocation. Chronic deformity
of the proximal right humerus is noted. Degenerative joint changes
of the right acromioclavicular joint are noted. Soft tissues are
unremarkable.
IMPRESSION: No acute abnormality.

## 2020-04-02 IMAGING — CT CT CERVICAL SPINE W/O CM
5 of 8 series · 12 of 33 positions shown, 13 images · non-contrast
Comparison: 02/11/2018 and prior CTs

CLINICAL DATA: 52-year-old female with acute headache and RIGHT
neck pain following fall. Initial encounter.

EXAM:
CT HEAD WITHOUT CONTRAST
CT CERVICAL SPINE WITHOUT CONTRAST
TECHNIQUE: Multidetector CT imaging of the head and cervical spine was
performed following the standard protocol without intravenous
contrast. Multiplanar CT image reconstructions of the cervical spine
were also generated.

[Series 5: head bone · axial · 0.42mm/px · z∈[+1279,+1333]mm · 2 of 82 slices shown]
[im 28/82  bone]
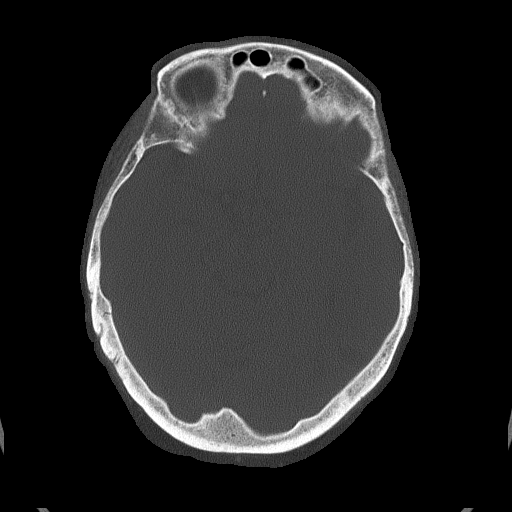
[im 55/82  bone]
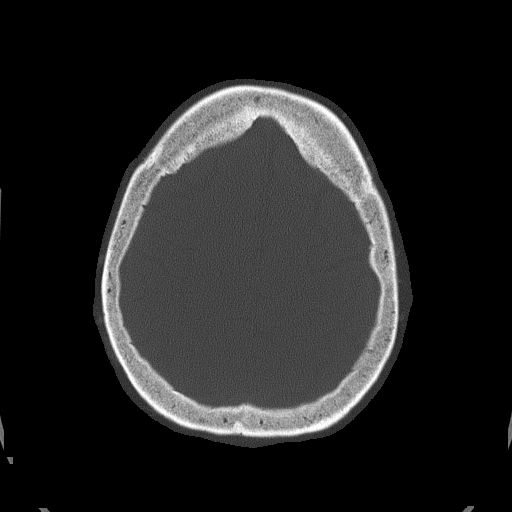

[Series 8: c_spine 2.0 st · axial · 0.29mm/px · z∈[+1140,+1194]mm · 2 of 83 slices shown, 3 images]
[im 28/83  soft-tissue]
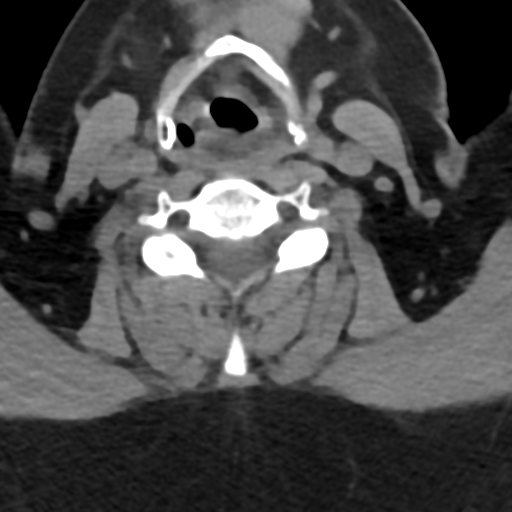
[im 28/83  bone]
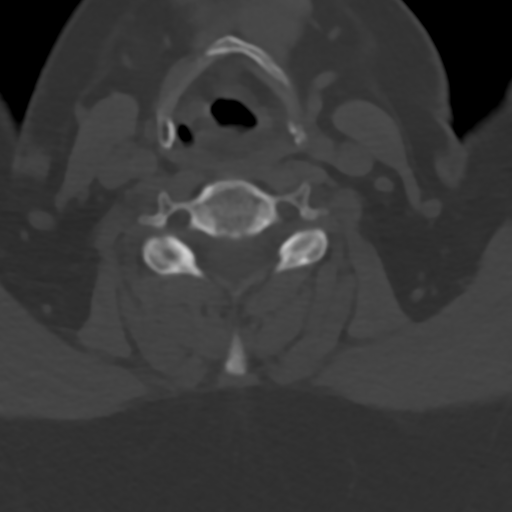
[im 55/83  bone]
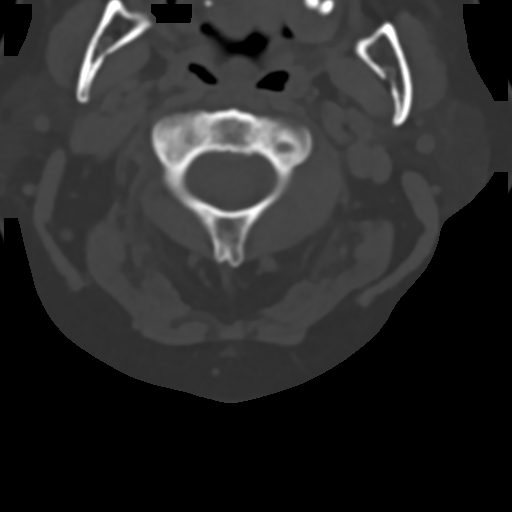

[Series 10: c_spine 2.0 sag bone · sagittal · 0.24mm/px · 5 of 61 slices shown]
[im 11/61  bone]
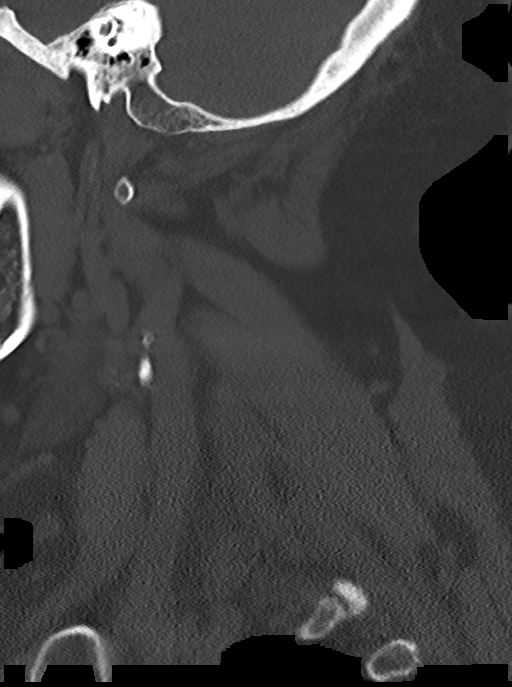
[im 21/61  bone]
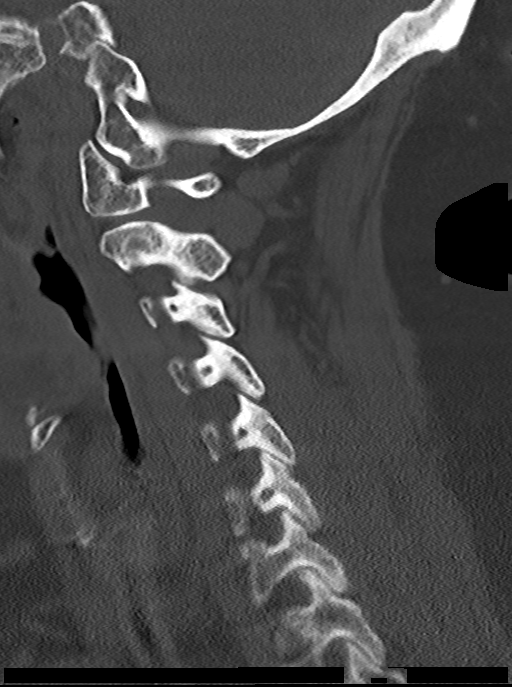
[im 31/61  bone]
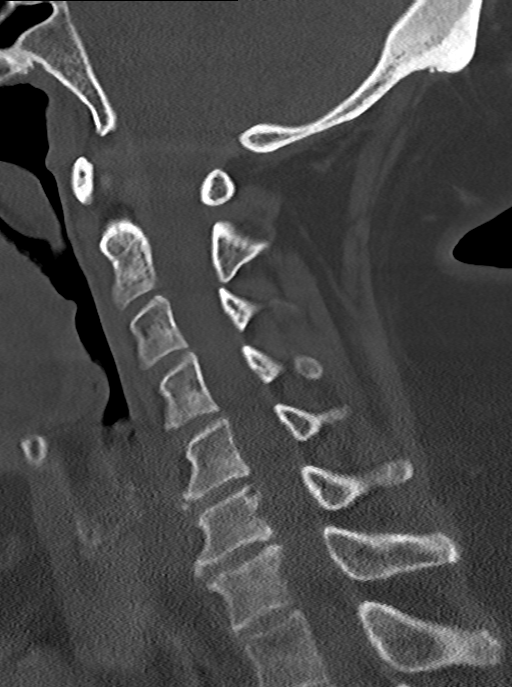
[im 41/61  bone]
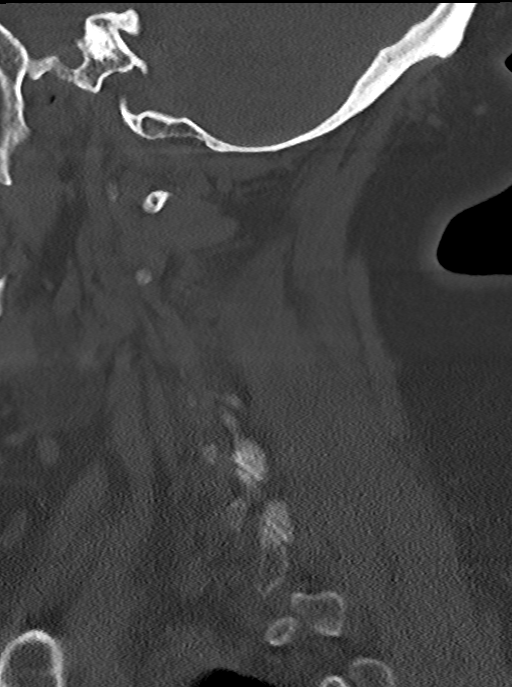
[im 51/61  bone]
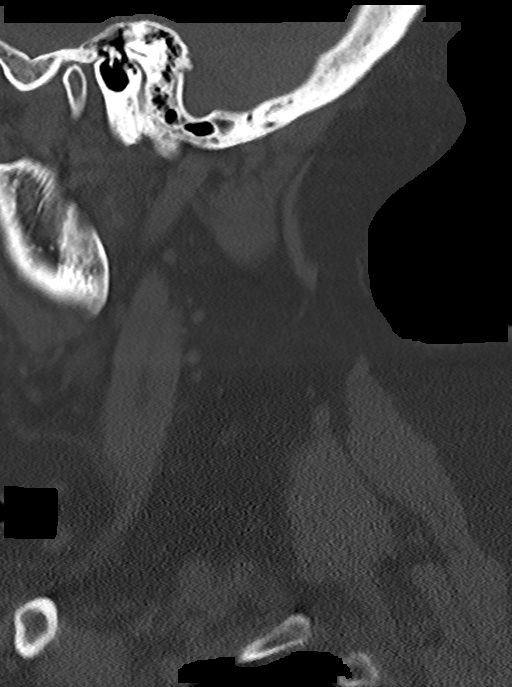

[Series 11: c_spine 2.0 cor bone · coronal · 0.24mm/px · 1 of 63 slices shown]
[im 32/63  bone]
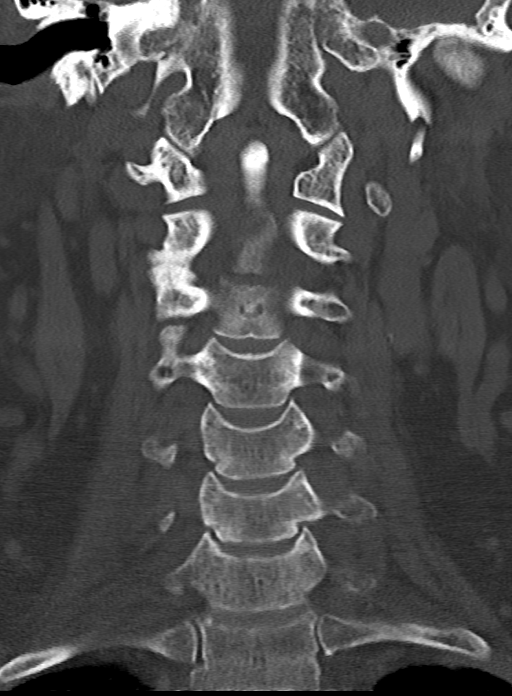

[Series 12: c_spine 2.0 orthogonals · axial · 0.21mm/px · z∈[+1127,+1168]mm · 2 of 67 slices shown]
[im 23/67  bone]
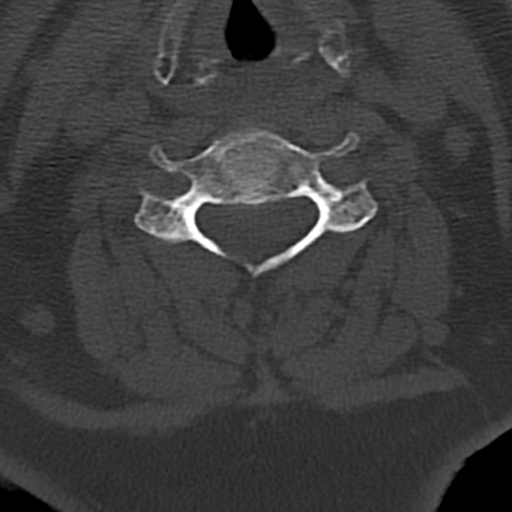
[im 45/67  bone]
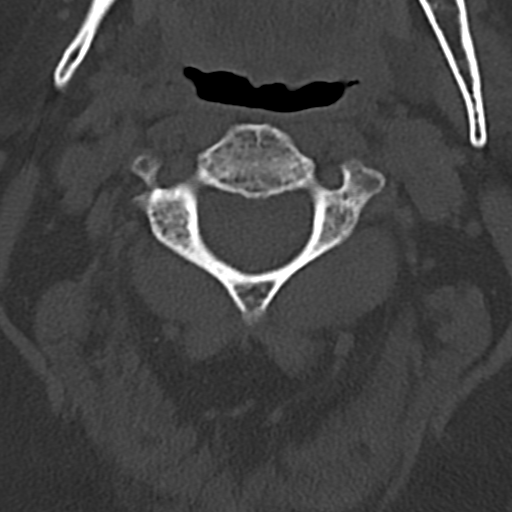

[12 of 33 positions shown; findings below may reference images not displayed]

FINDINGS: CT HEAD FINDINGS

Brain: No evidence of acute infarction, hemorrhage, hydrocephalus,
extra-axial collection or mass lesion/mass effect.

A remote LEFT occipital infarct is again noted.

Vascular: Mild atherosclerotic calcifications again noted.

Skull: Normal. Negative for fracture or focal lesion.

Sinuses/Orbits: No acute finding.

Other: None.

CT CERVICAL SPINE FINDINGS

Alignment: Normal.

Skull base and vertebrae: No acute fracture. No primary bone lesion
or focal pathologic process.

Soft tissues and spinal canal: No prevertebral fluid or swelling. No
visible canal hematoma.

Disc levels: Mild degenerative disc disease and spondylosis from
C5-T1 noted.

Upper chest: No acute abnormality.

Other: None
IMPRESSION: 1. No evidence of acute intracranial abnormality. Remote LEFT
occipital infarct.
2. No static evidence of acute injury to the cervical spine. Mild
multilevel degenerative changes again noted.

## 2020-04-02 IMAGING — CR DG THORACIC SPINE 3V
3 series · 3 of 3 positions shown · non-contrast
Comparison: None.

CLINICAL DATA: Pain after fall today.

EXAM:
THORACIC SPINE - 3 VIEWS

[t-spine ap]
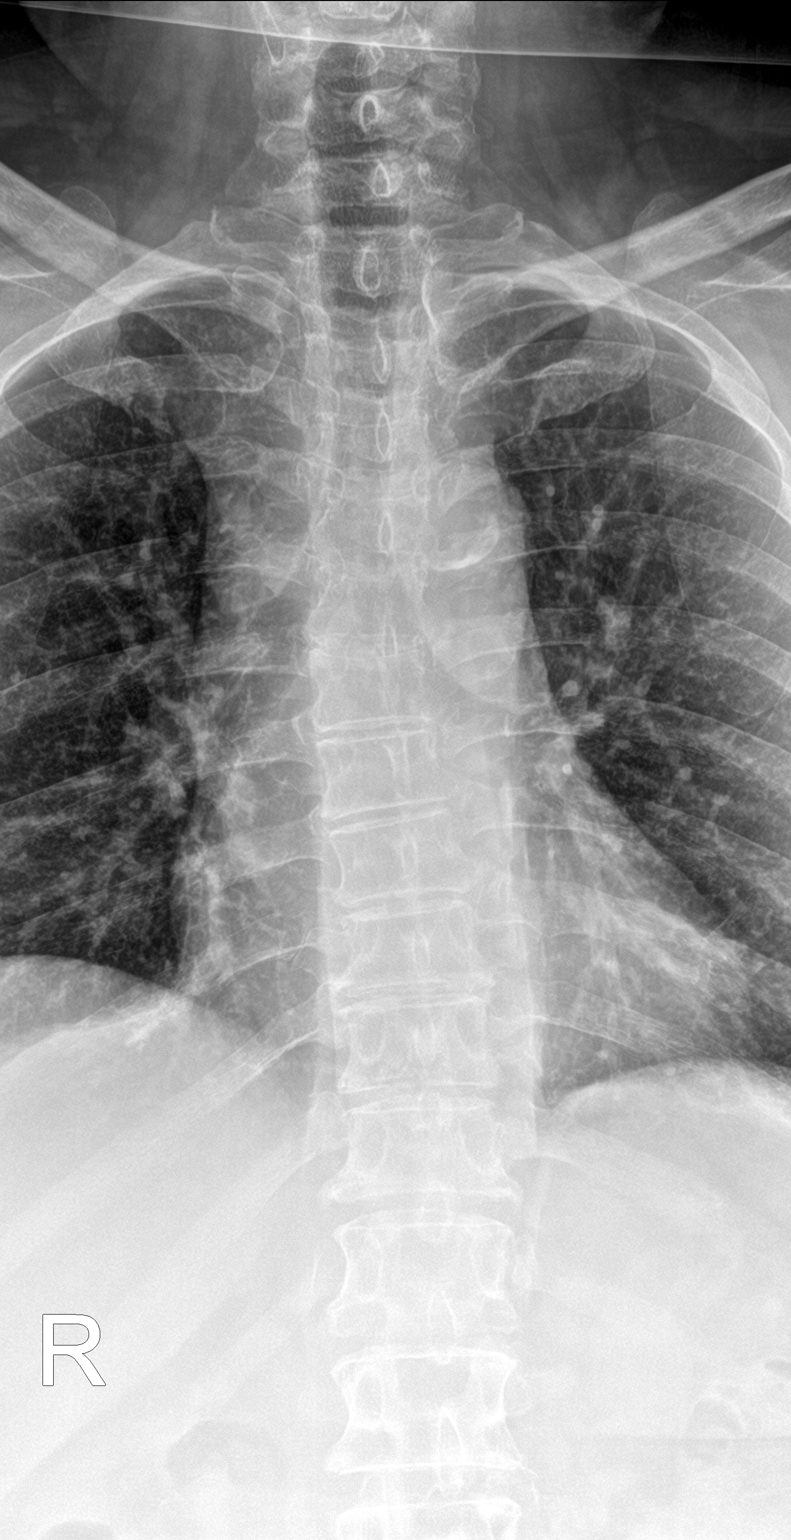

[t-spine lat]
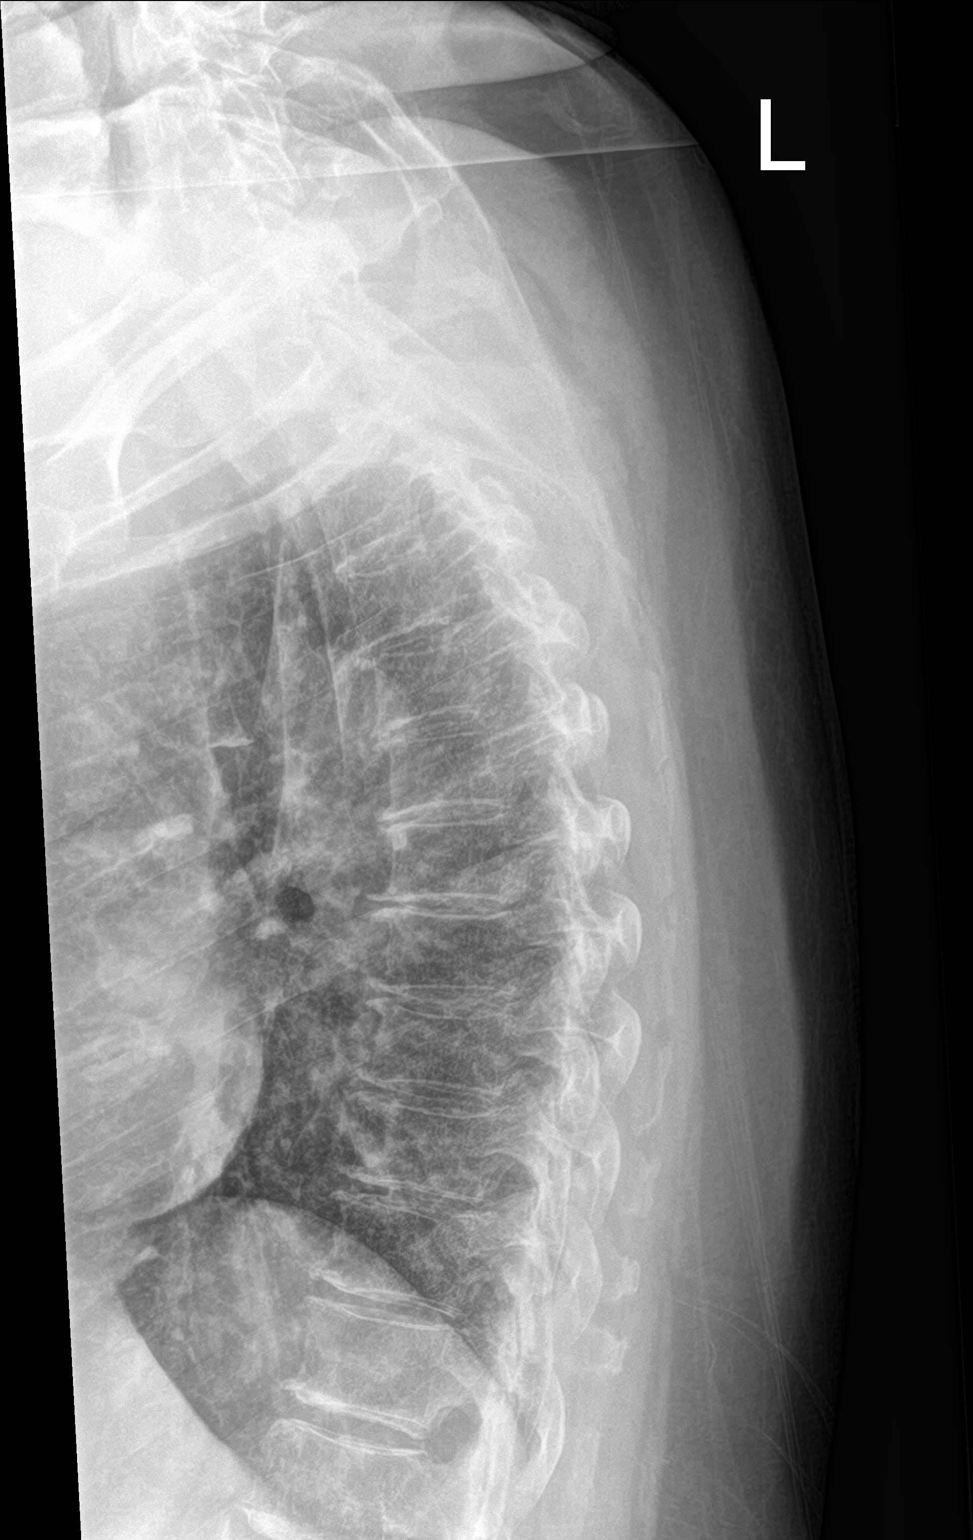

[t-spine swimmers]
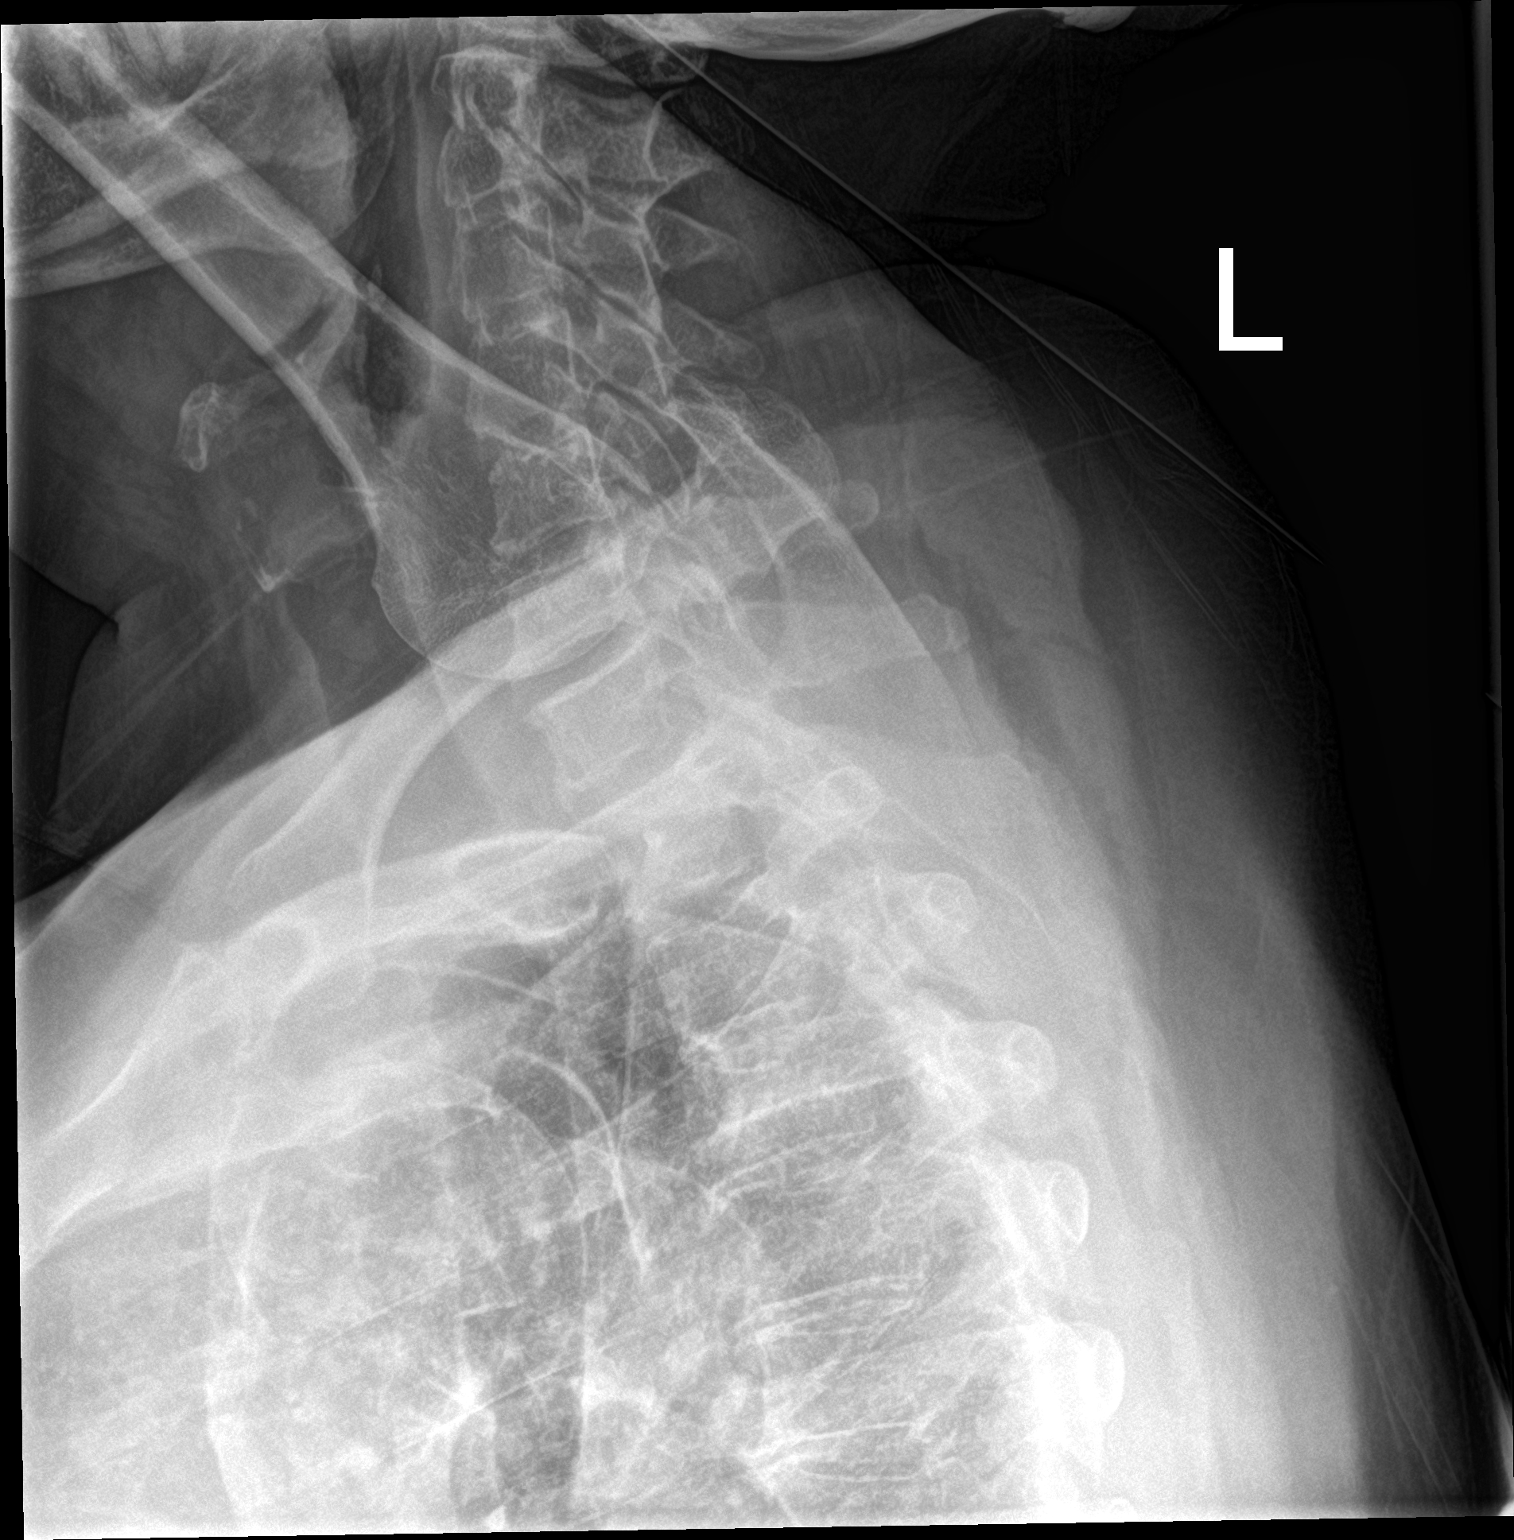

[3 of 3 positions shown; findings below may reference images not displayed]

FINDINGS: There is no evidence of thoracic spine fracture. There are 12 ribbed
thoracic vertebrae. Mild multilevel disc flattening is noted with
small anterior osteophytes compatible mild degenerative change of
the thoracic spine. No acute fracture, malalignment nor bone
destruction.
IMPRESSION: Mild degenerative change of the thoracic spine without acute osseous
appearing abnormality.

## 2020-04-02 IMAGING — CR DG LUMBAR SPINE COMPLETE 4+V
5 series · 5 of 5 positions shown · non-contrast
Comparison: None.

CLINICAL DATA: Patient fell down 2 stairs at home today.  Pain.

EXAM:
LUMBAR SPINE - COMPLETE 4+ VIEW

[l-spine ap]
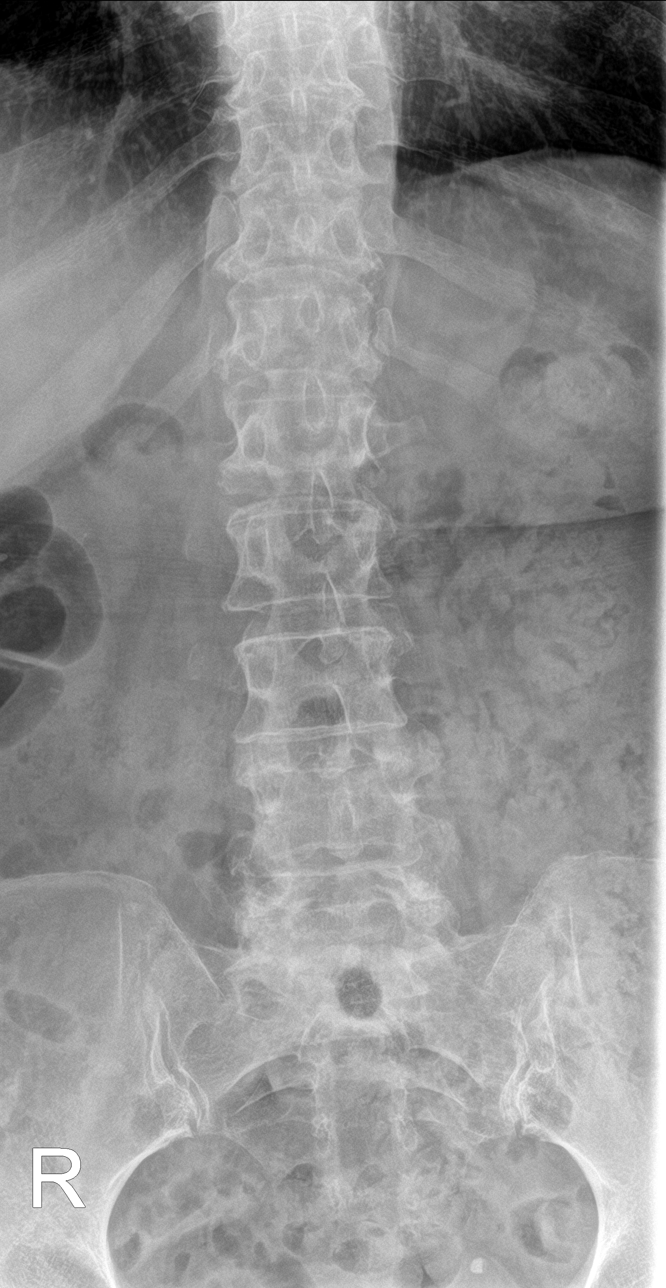

[l-spine obl (1 of 2)]
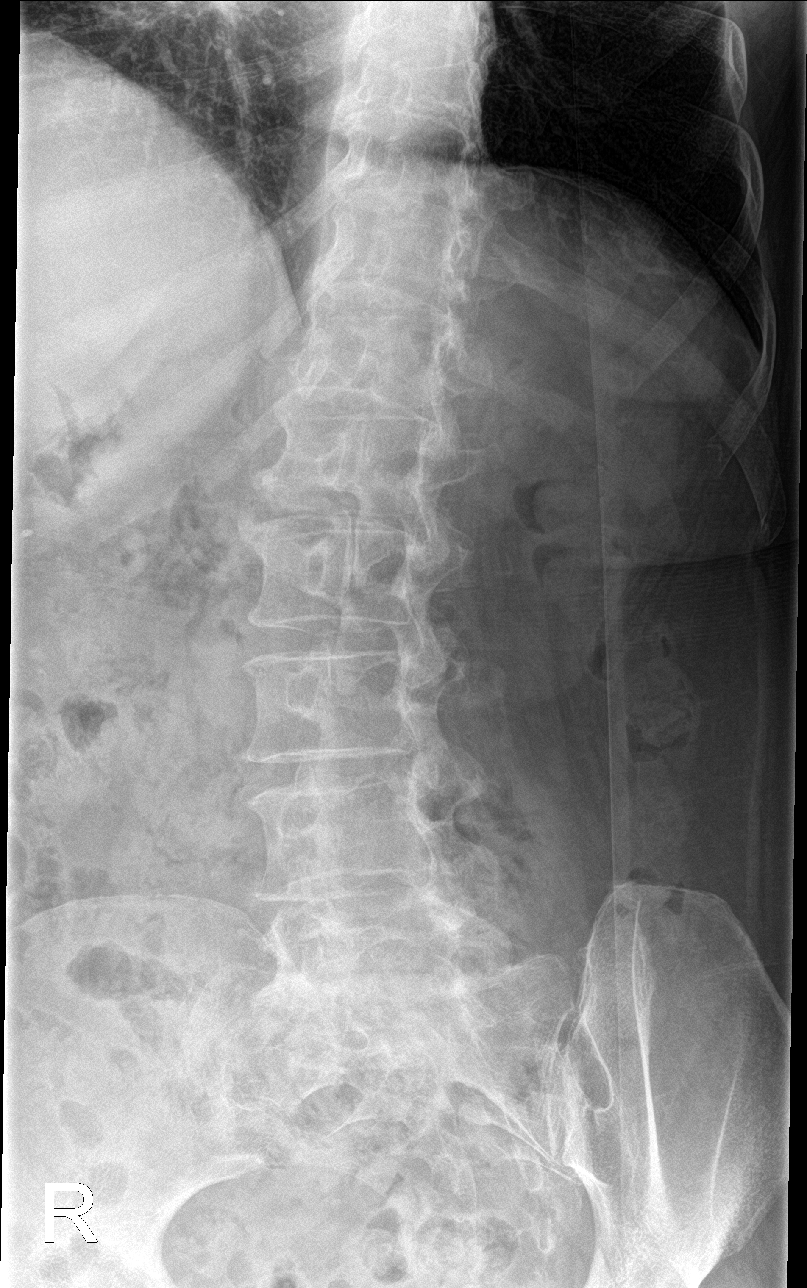

[l-spine obl (2 of 2)]
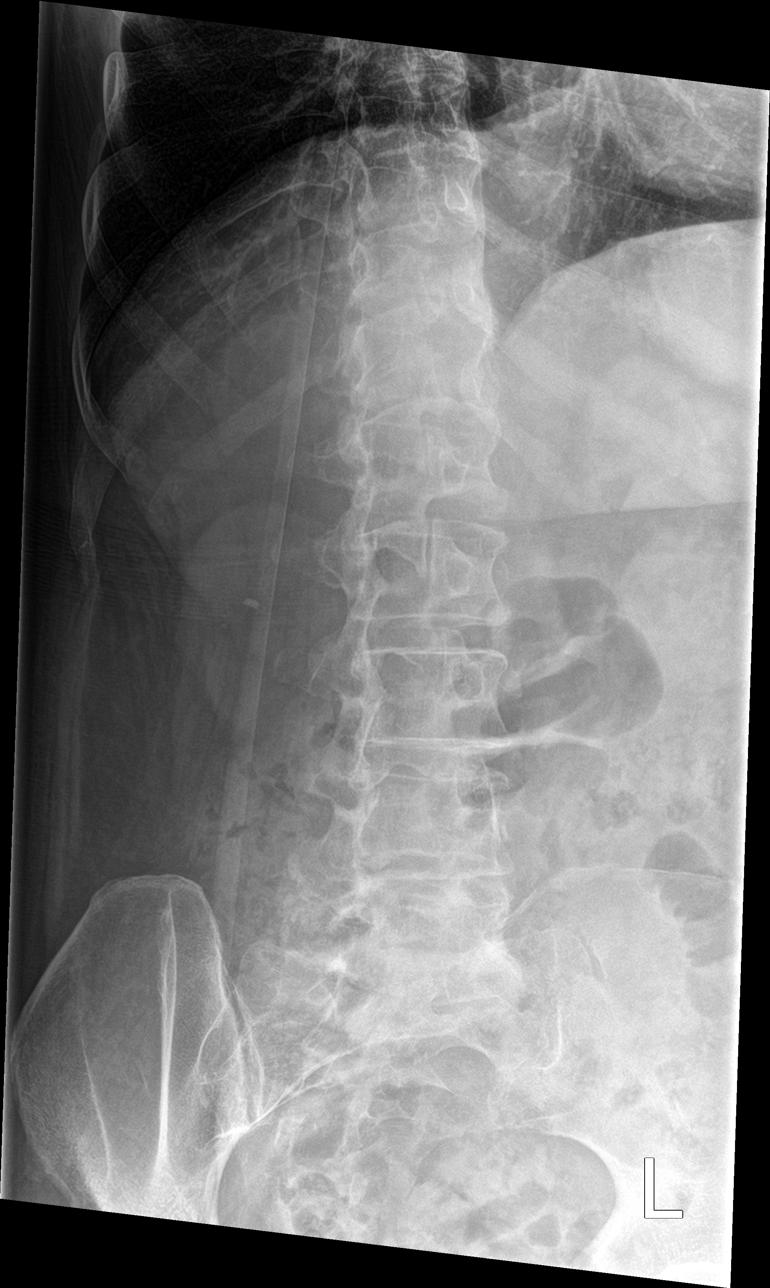

[l-spine lat]
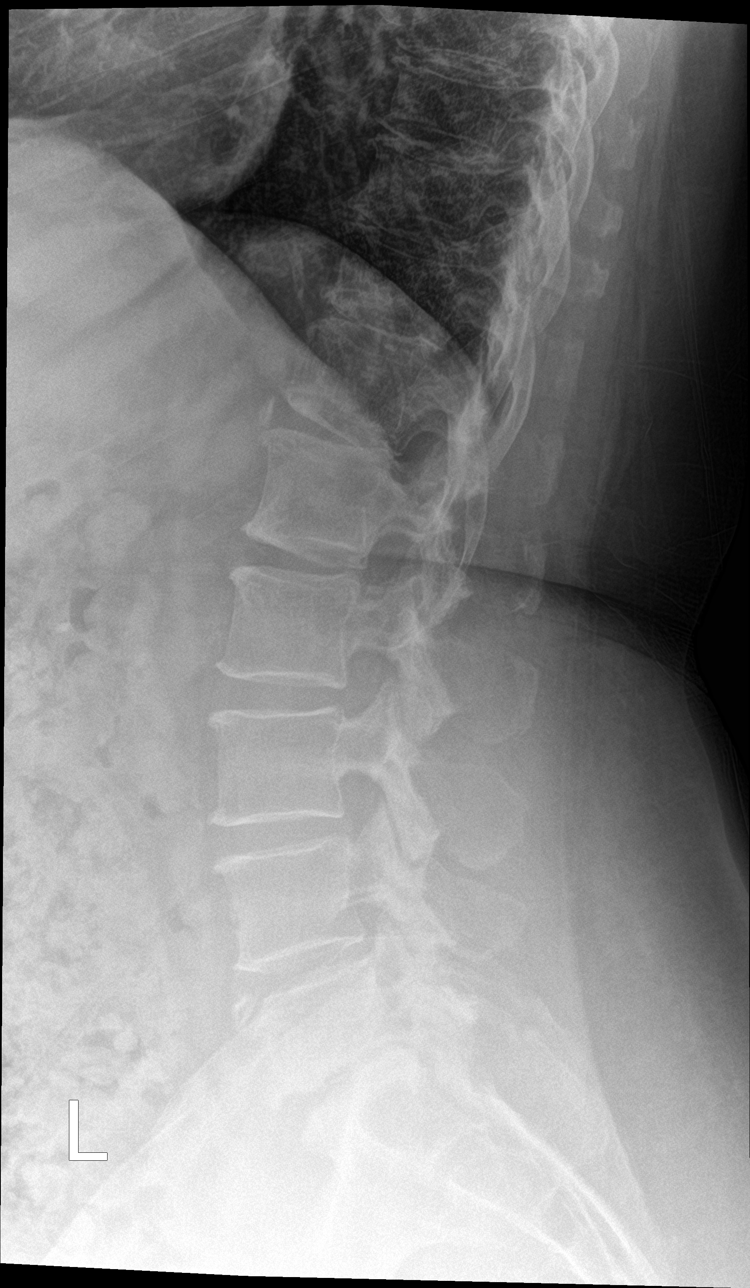

[l-spine spot]
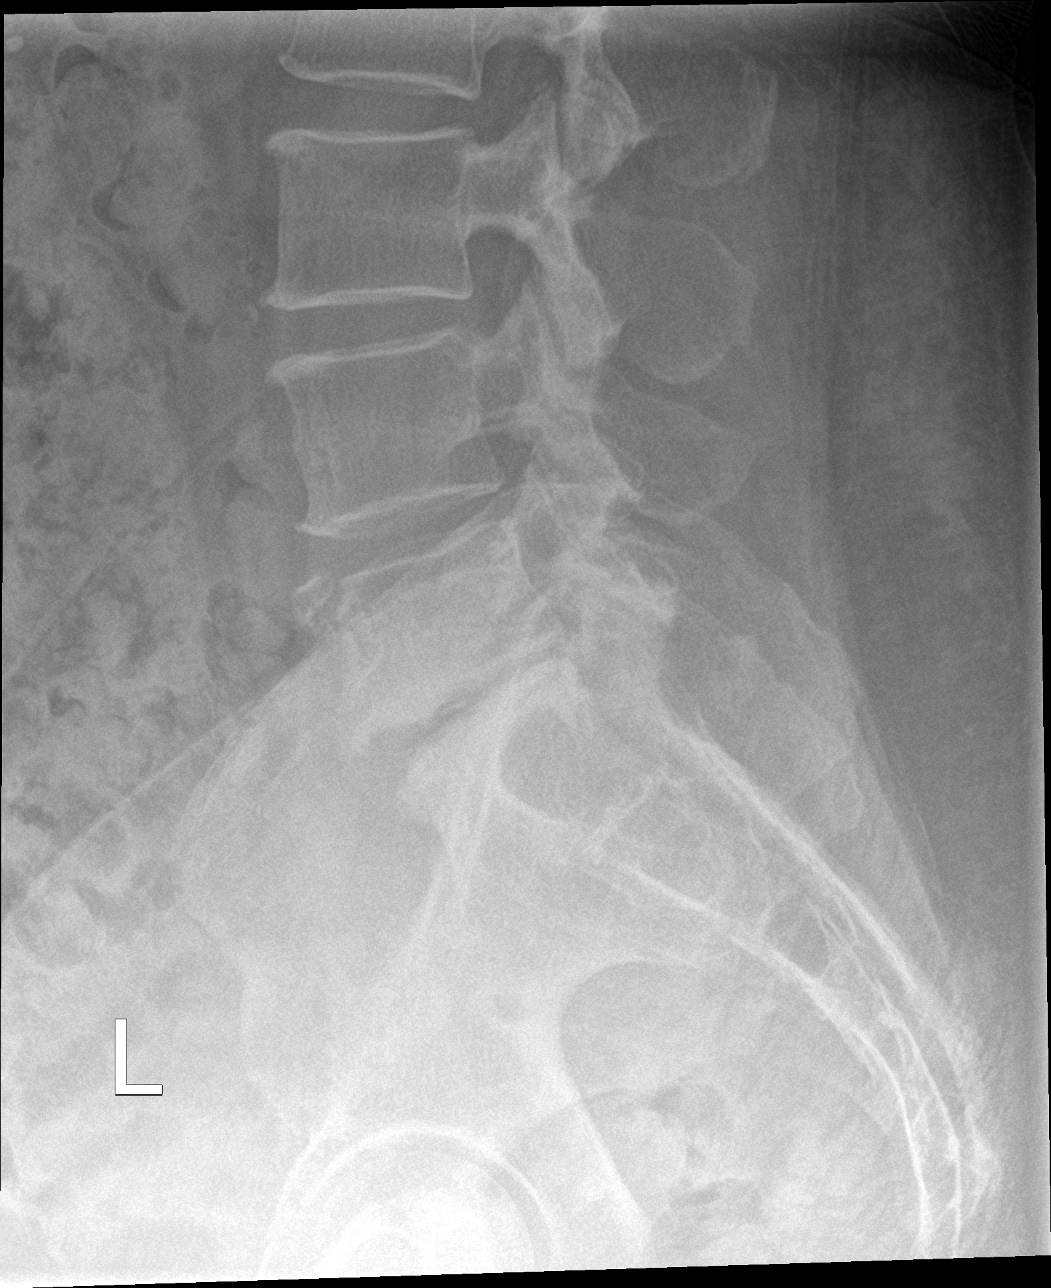

[5 of 5 positions shown; findings below may reference images not displayed]

FINDINGS: There are 5 non ribbed lumbar vertebrae in normal lumbar lordosis.
No acute lumbar spine fracture, pars defects or listhesis. Moderate
disc space narrowing is identified at L5-S1. Probable old limbus
vertebra of L5 along the anterior superior aspect. Facet sclerosis
from L3 through S1 compatible with degenerative facet arthropathy.
IMPRESSION: Degenerative disc disease L5-S1 with L3 through S1 facet
arthropathy. No acute fracture or listhesis.

## 2020-04-02 IMAGING — CR DG FOREARM 2V*R*
2 series · 2 of 2 positions shown · non-contrast
Comparison: None.

CLINICAL DATA: Forearm pain after fall today.

EXAM:
RIGHT FOREARM - 2 VIEW

[forearm ap]
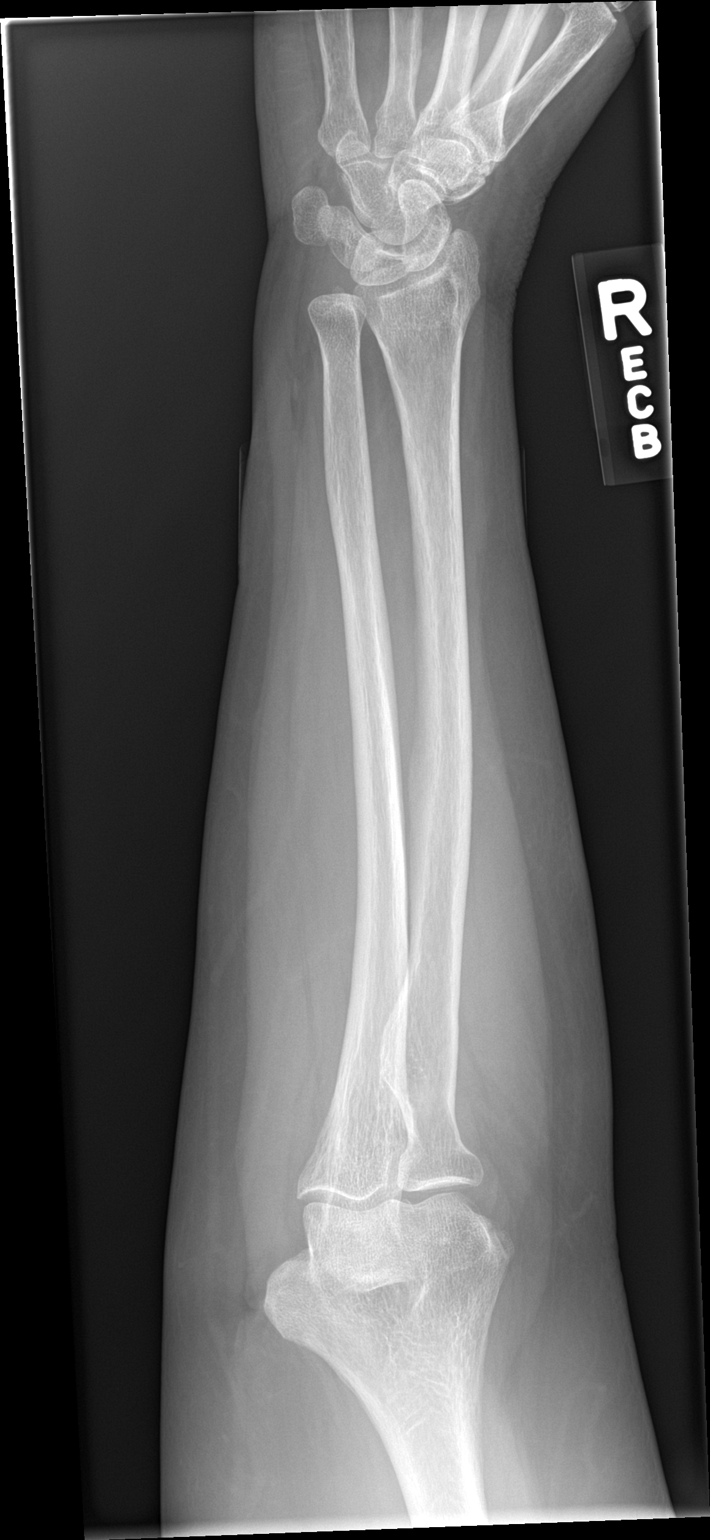

[forearm lat]
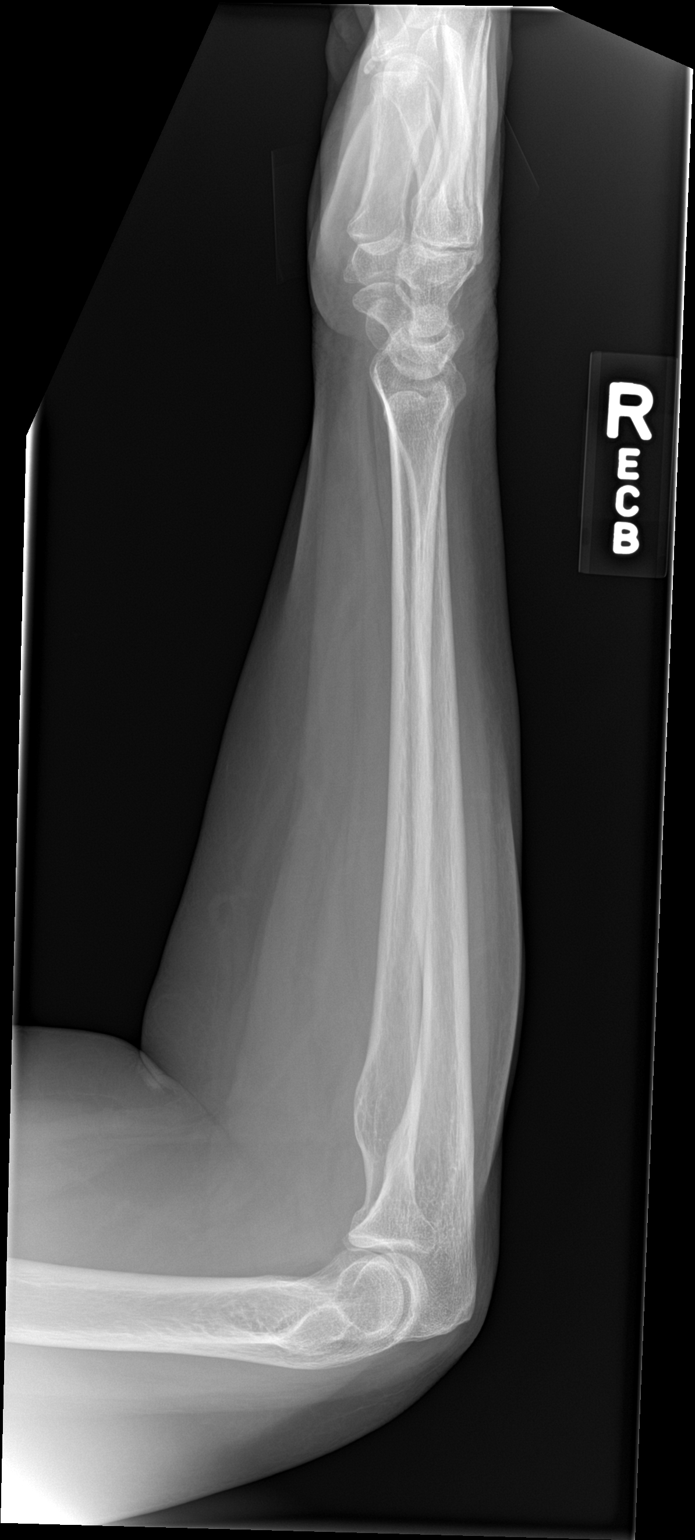

[2 of 2 positions shown; findings below may reference images not displayed]

FINDINGS: There is no evidence of fracture or other focal bone lesions. Mild
soft tissue swelling along the dorsum of the mid forearm.
IMPRESSION: Mild dorsal soft tissue swelling of the forearm. No acute fracture
of the radius nor ulna. Intact joint spaces.

## 2020-04-18 ENCOUNTER — Other Ambulatory Visit: Payer: Self-pay

## 2020-04-18 ENCOUNTER — Encounter (HOSPITAL_COMMUNITY): Payer: Self-pay | Admitting: Emergency Medicine

## 2020-04-18 ENCOUNTER — Telehealth: Payer: Self-pay | Admitting: Neurology

## 2020-04-18 ENCOUNTER — Ambulatory Visit (HOSPITAL_COMMUNITY)
Admission: EM | Admit: 2020-04-18 | Discharge: 2020-04-18 | Disposition: A | Discharge status: Home / Self Care | Payer: Medicaid Other | Attending: Family Medicine | Admitting: Family Medicine

## 2020-04-18 MED ORDER — LEVETIRACETAM 500 MG PO TABS: 500.0000 mg | ORAL_TABLET | Freq: Two times a day (BID) | ORAL | 3 refills | Status: DC

## 2020-04-18 NOTE — Telephone Encounter (Signed)
We can have her start zonisamide 100 mg one po nightly  #30  #3 refills to see if that will help her chronic migraines more.

## 2020-04-18 NOTE — Telephone Encounter (Signed)
Called patient back to further discuss. She went to [facility] ER 04/10/20 and 04/15/20 post falls/fractures. They did CT scan. Gave her aimovig inj. Advised per Dr. Epimenio Foot, he reviewed her chart since Dr. Anne Hahn is out and can call in another round of dexamethasone for her like Dr. Anne Hahn did in the past. She declined, states she had one left over from last time. She took it last night but it made her blood sugar high. She would like an alternative. Advised I will send message back to Dr. Epimenio Foot for suggestions.

## 2020-04-18 NOTE — Telephone Encounter (Signed)
Called pt back. Relayed Dr. Bonnita Hollow recommendation. She is agreeable to try zonegran. Advised she needs to take daily. Should take for at least 2-3 weeks to determine if it is helping. She verbalized understanding. However, when I went to e-scribe it flagged d/t pt allergy to sulfa (itching/rash). I spoke with MD. He recommends Keppra instead. Appears per [facility] records that she already takes 250mg  po BID. If she is taking it this way, he recommends increasing her dose to 500mg  po BID. I called patient back. Confirmed she is on Keppra 250mg  po BID. She is agreeable to try and increase her Keppra to 500mg  po BID. I e-scribed to the pharmacy. In the meantime, she is going to go back to the ER for immediate treatment for severe migraine. She will call back if sx have not improved by next week. Aware her appt with is on 05/24/20 at 2pm. She is going to try and arrange transportation. Hard for her to get out. She will call back if she is unable to arrange transportation.

## 2020-04-18 NOTE — ED Triage Notes (Signed)
Pt presents to [facility] for assessment of headache x 3 days to right side of head.  Pt states she spoke to her neurologist and he has increased her Keppra, but told her it would take a few weeks for her to get relief.  Patient states her BP was 200+ systolic this morning.  Pt c/o emesis.  Pt states pain behind right eye.

## 2020-04-18 NOTE — Telephone Encounter (Signed)
Pt called had 3 falls in the past 2 weeks; fractured nose, fractured middle nose, busted head in the bathroom. Pt would like nurse to call in a medication for my migraine. Pt said physician cannot call in anything I will have to go to the ER.

## 2020-04-20 ENCOUNTER — Other Ambulatory Visit: Payer: Self-pay

## 2020-04-20 ENCOUNTER — Emergency Department (HOSPITAL_COMMUNITY): Payer: Medicaid Other

## 2020-04-20 ENCOUNTER — Emergency Department
Admission: EM | Admit: 2020-04-20 | Discharge: 2020-04-20 | Disposition: A | Discharge status: Home / Self Care | Payer: Medicaid Other | Attending: Emergency Medicine | Admitting: Emergency Medicine

## 2020-04-20 ENCOUNTER — Emergency Department: Payer: Medicaid Other

## 2020-04-20 ENCOUNTER — Emergency Department (HOSPITAL_COMMUNITY)
Admission: EM | Admit: 2020-04-20 | Discharge: 2020-04-20 | Disposition: A | Discharge status: Home / Self Care | Payer: Medicaid Other | Attending: Emergency Medicine | Admitting: Emergency Medicine

## 2020-04-20 DIAGNOSIS — Z5321 Procedure and treatment not carried out due to patient leaving prior to being seen by health care provider: Secondary | ICD-10-CM | POA: Insufficient documentation

## 2020-04-20 DIAGNOSIS — R519 Headache, unspecified: Secondary | ICD-10-CM | POA: Insufficient documentation

## 2020-04-20 DIAGNOSIS — R112 Nausea with vomiting, unspecified: Secondary | ICD-10-CM | POA: Diagnosis not present

## 2020-04-20 DIAGNOSIS — R079 Chest pain, unspecified: Secondary | ICD-10-CM | POA: Insufficient documentation

## 2020-04-20 LAB — CBC
HCT: 42.7 % (ref 36.0–46.0)
Hemoglobin: 13.3 g/dL (ref 12.0–15.0)
MCH: 26.3 pg (ref 26.0–34.0)
MCHC: 31.1 g/dL (ref 30.0–36.0)
MCV: 84.6 fL (ref 80.0–100.0)
Platelets: 249 10*3/uL (ref 150–400)
RBC: 5.05 MIL/uL (ref 3.87–5.11)
RDW: 16 % — ABNORMAL HIGH (ref 11.5–15.5)
WBC: 9.3 10*3/uL (ref 4.0–10.5)
nRBC: 0 % (ref 0.0–0.2)

## 2020-04-20 LAB — URINALYSIS, ROUTINE W REFLEX MICROSCOPIC
Bilirubin Urine: NEGATIVE
Glucose, UA: >=500 mg/dL — AB
Hgb urine dipstick: NEGATIVE
Ketones, ur: NEGATIVE mg/dL
Nitrite: NEGATIVE
Protein, ur: NEGATIVE mg/dL
Specific Gravity, Urine: 1.006 (ref 1.005–1.030)
pH: 7 (ref 5.0–8.0)

## 2020-04-20 LAB — BASIC METABOLIC PANEL
Anion gap: 15 (ref 5–15)
BUN: 13 mg/dL (ref 6–20)
CO2: 23 mmol/L (ref 22–32)
Calcium: 9.5 mg/dL (ref 8.9–10.3)
Chloride: 99 mmol/L (ref 98–111)
Creatinine, Ser: 0.79 mg/dL (ref 0.44–1.00)
GFR calc Af Amer: >60 mL/min (ref >60)
GFR calc non Af Amer: >60 mL/min (ref >60)
Glucose, Bld: 128 mg/dL — ABNORMAL HIGH (ref 70–99)
Potassium: 3.5 mmol/L (ref 3.5–5.1)
Sodium: 137 mmol/L (ref 135–145)

## 2020-04-20 LAB — TROPONIN I (HIGH SENSITIVITY): Troponin I (High Sensitivity): <2 ng/L (ref <18)

## 2020-04-20 LAB — I-STAT BETA HCG BLOOD, ED (NOT ORDERABLE): I-stat hCG, quantitative: 6.5 m[IU]/mL — ABNORMAL HIGH (ref <5)

## 2020-04-20 NOTE — ED Triage Notes (Signed)
Pt states headache that began three days pta with nausea, vomiting. Pt is ambulatory without difficulty. Pt states she fell going to restroom days ago hitting head.

## 2020-04-20 NOTE — ED Triage Notes (Signed)
Patient states she has a headache that began a few days ago. Patient reports she fell and hit her head and it knocked her out. Patient reports chest pain

## 2020-05-12 DIAGNOSIS — G43909 Migraine, unspecified, not intractable, without status migrainosus: Secondary | ICD-10-CM | POA: Insufficient documentation

## 2020-05-24 ENCOUNTER — Other Ambulatory Visit: Payer: Self-pay

## 2020-05-24 ENCOUNTER — Telehealth: Payer: Self-pay | Admitting: Neurology

## 2020-05-24 ENCOUNTER — Ambulatory Visit: Payer: Medicaid Other | Admitting: Neurology

## 2020-05-24 MED ORDER — LAMOTRIGINE 100 MG PO TABS: 100.0000 mg | ORAL_TABLET | Freq: Two times a day (BID) | ORAL | Status: DC

## 2020-05-24 NOTE — Telephone Encounter (Signed)
I called the patient, left a message, I will call back later. 

## 2020-05-24 NOTE — Telephone Encounter (Signed)
I called the patient. She is to go up on the lamictal to 100 mg twice a day.   We will try to get another RV set up for her.

## 2020-05-24 NOTE — Telephone Encounter (Signed)
Stephanie [name] - Unfortunately this Pt came in earlier than her scheduled appt and her husband left work to take her so she opted to reschedule. She has a med question..lamotrigine 100mg  directions read on the bottle 1 at bedtime 14days then increase 1 by mouth twice a day. She didn't see the second part so she wanted to advise she will start today. She does need to schedule an appt and wanted to see if it was possible to do virtual. I didn't make a new appt because she wanted to wait to speak to the Dr. About the meds.  Thank you

## 2020-05-25 ENCOUNTER — Ambulatory Visit: Payer: Medicaid Other | Admitting: Neurology

## 2020-05-30 NOTE — Telephone Encounter (Signed)
VV Appt made with Margie Ege NP

## 2020-05-31 IMAGING — CR DG RIBS W/ CHEST 3+V*R*
5 series · 5 of 5 positions shown · non-contrast
Comparison: 02/14/2017

CLINICAL DATA: Fall with right-sided rib pain

EXAM:
RIGHT RIBS AND CHEST - 3+ VIEW

[w chest pa (1 of 3)]
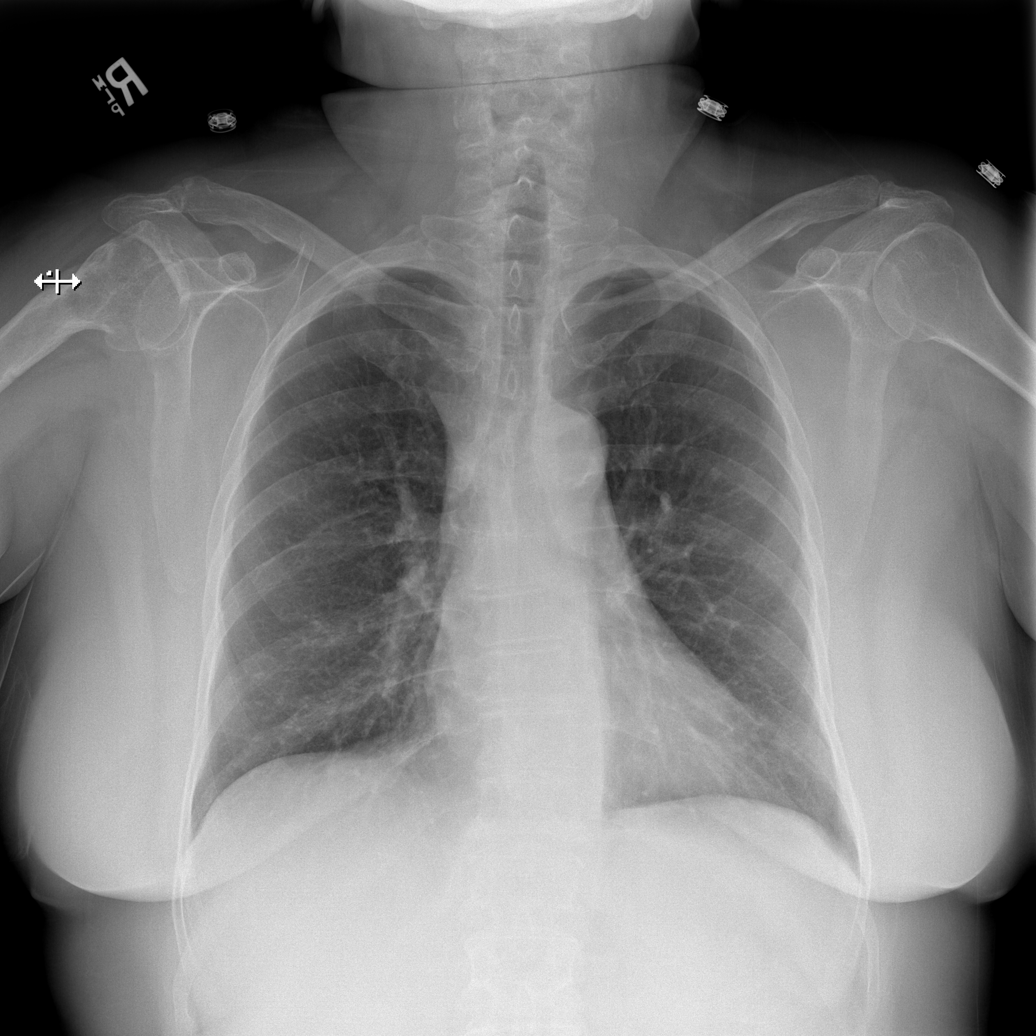

[w chest pa (2 of 3)]
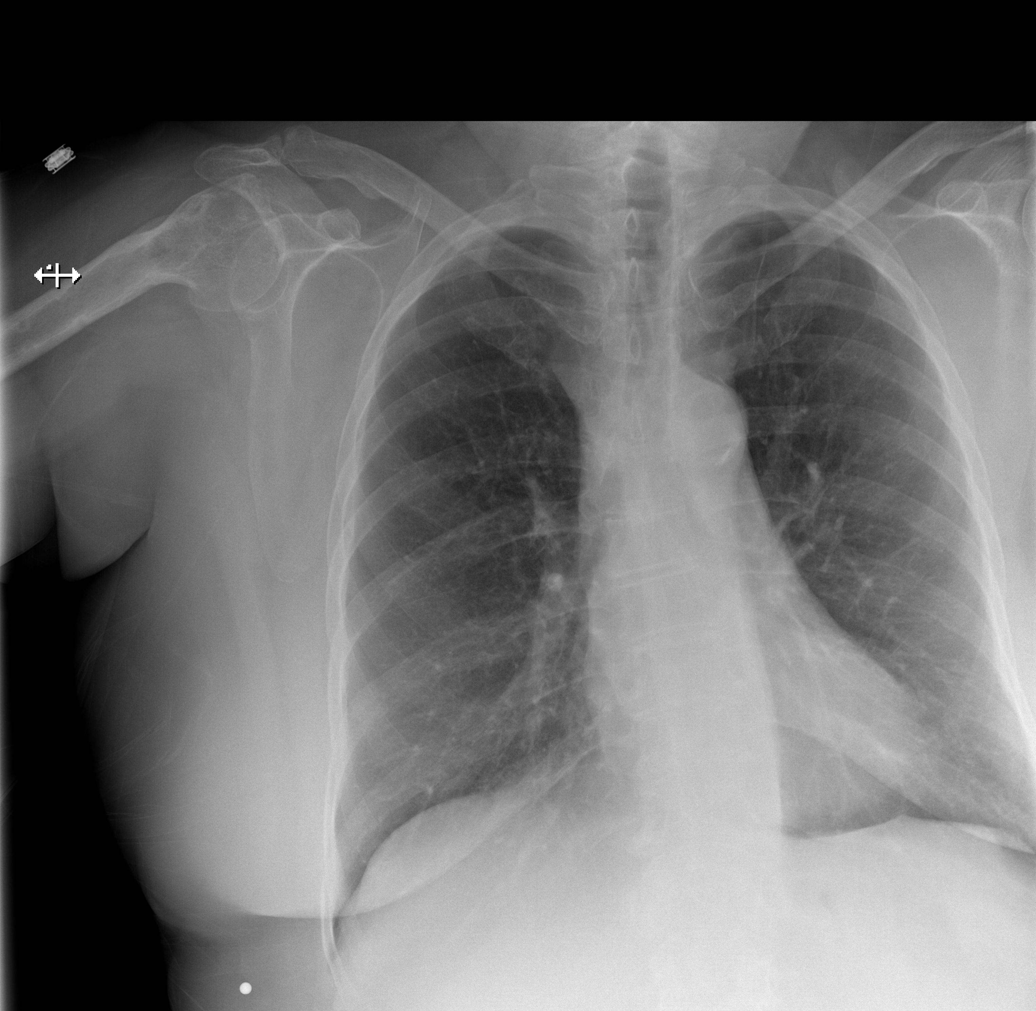

[w chest pa (3 of 3)]
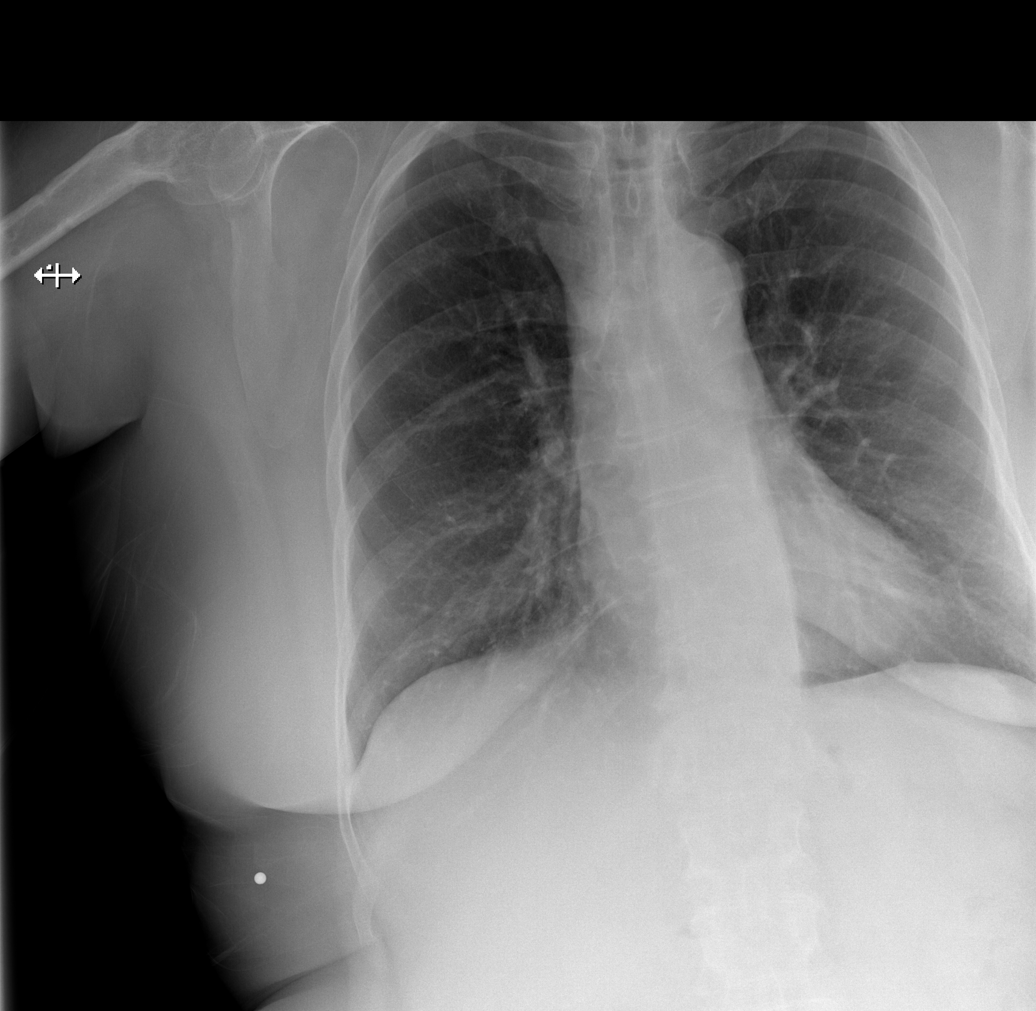

[w ribs obl right (1 of 2)]
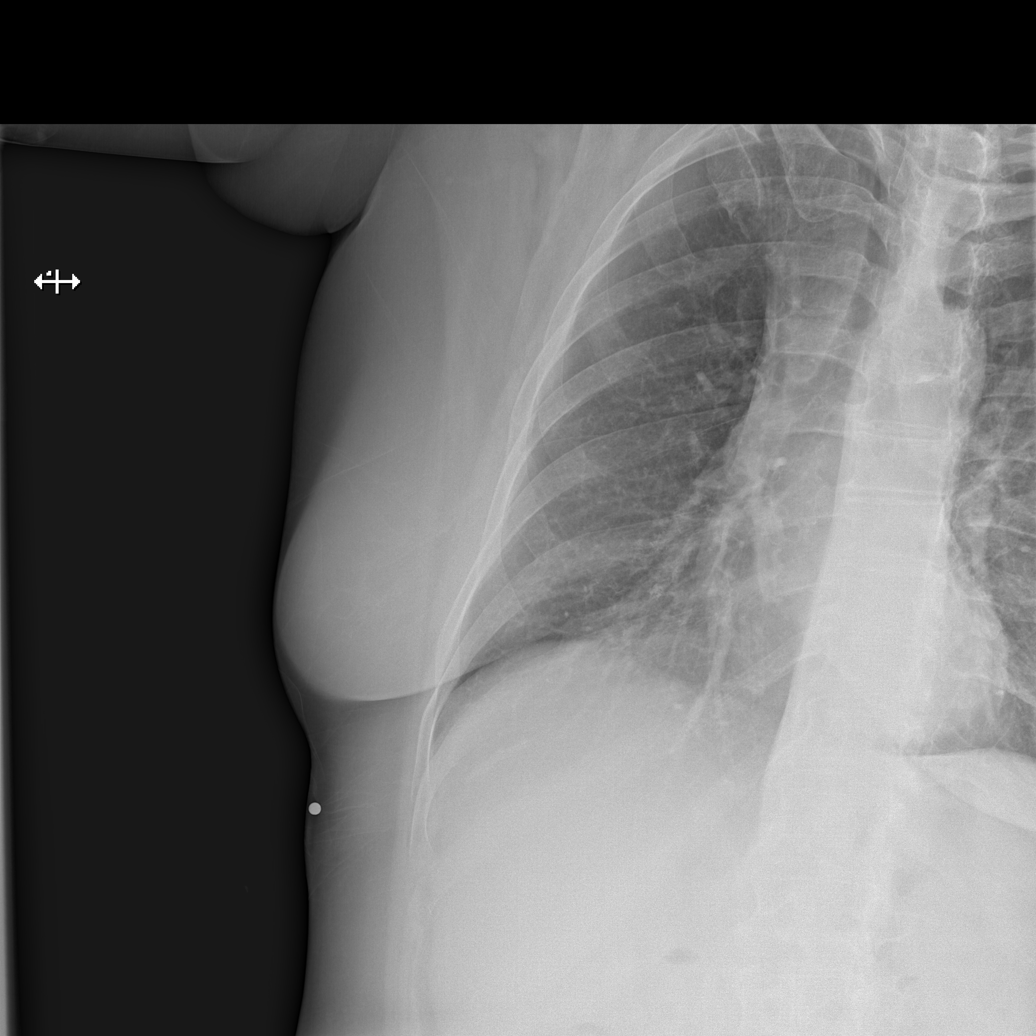

[w ribs obl right (2 of 2)]
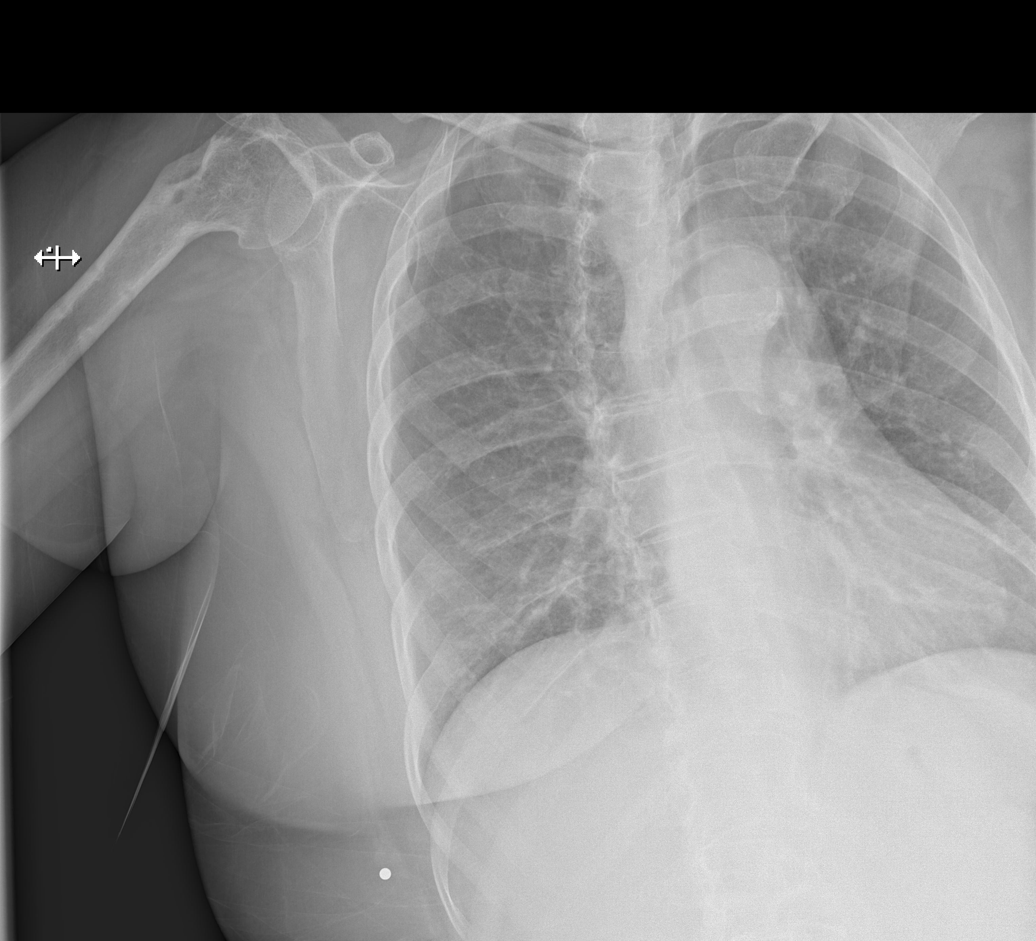

[5 of 5 positions shown; findings below may reference images not displayed]

FINDINGS: Single view chest demonstrates no acute consolidation or effusion.
Normal heart size. No pneumothorax. Possible old fracture deformity
of the proximal right humerus.

Right rib series demonstrates no acute displaced right rib fracture
IMPRESSION: Negative.

## 2020-06-01 IMAGING — CT CT HEAD W/O CM
3 series · 15 of 47 positions shown, 18 images · non-contrast
Comparison: Head CT 03/03/2018

CLINICAL DATA: Headache for 3 days. Head trauma, minor, GCS>=13,
high clinical risk, initial exam

EXAM:
CT HEAD WITHOUT CONTRAST
TECHNIQUE: Contiguous axial images were obtained from the base of the skull
through the vertex without intravenous contrast.

[Series 4: head wo · axial · 0.47mm/px · z∈[-344,-219]mm · 9 of 31 slices shown, 12 images]
[im 3/31  brain]
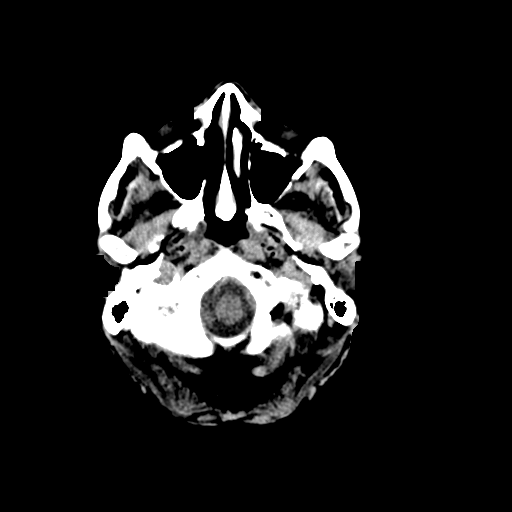
[im 3/31  bone]
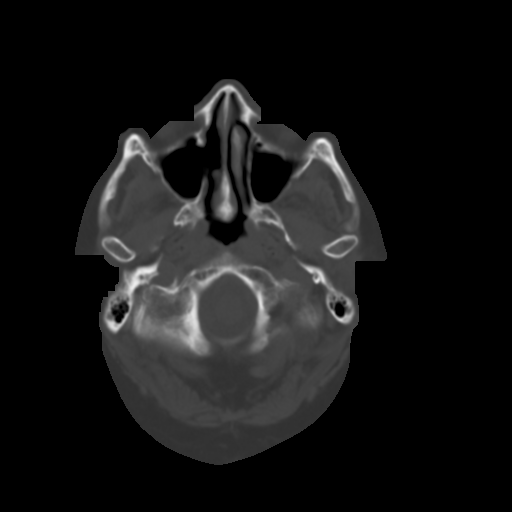
[im 6/31  brain]
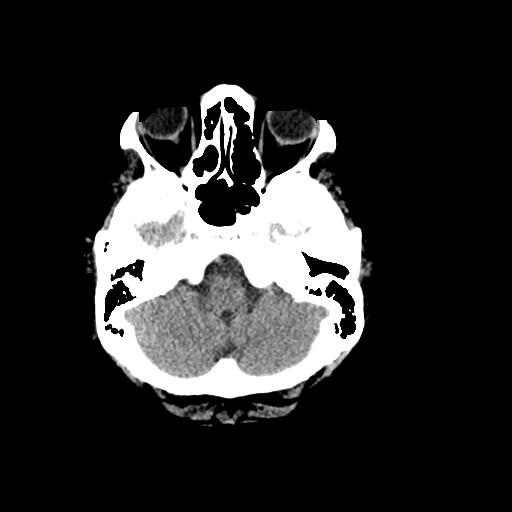
[im 9/31  brain]
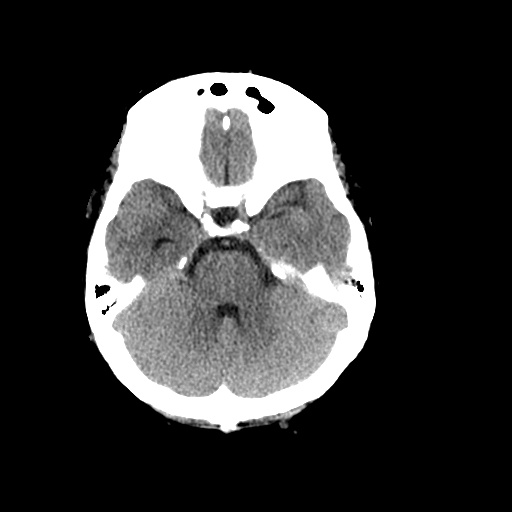
[im 12/31  brain]
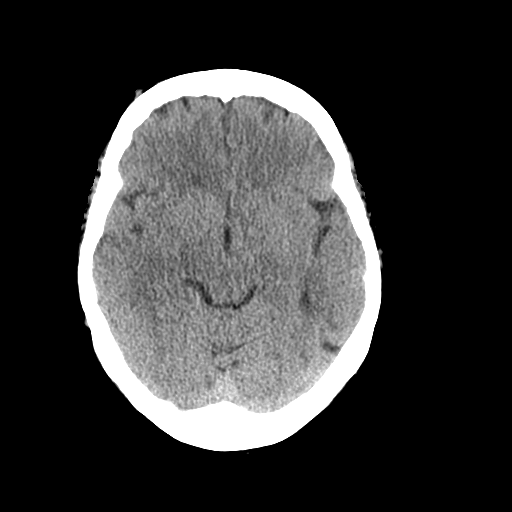
[im 16/31  brain]
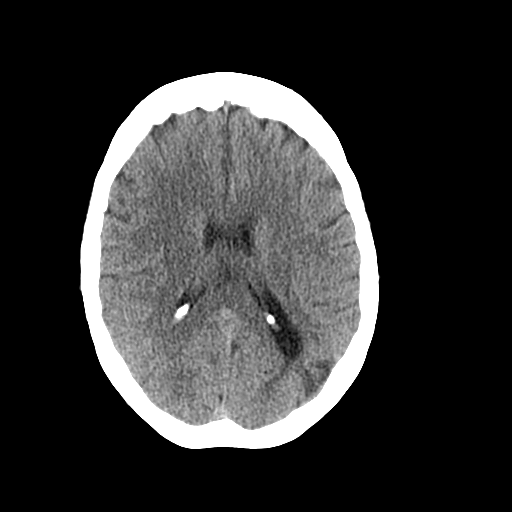
[im 16/31  bone]
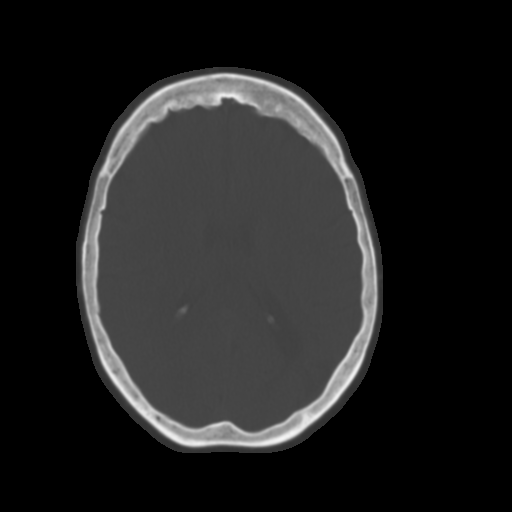
[im 19/31  brain]
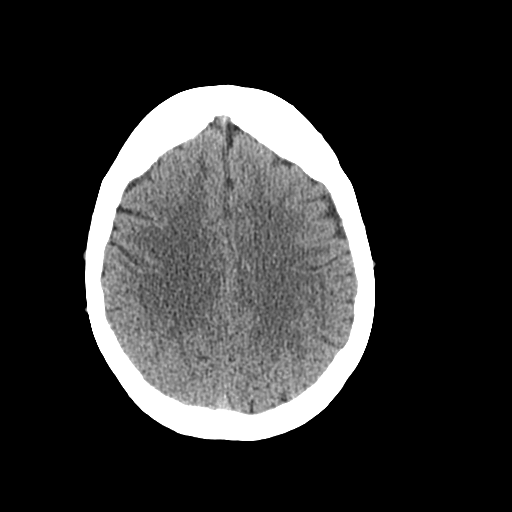
[im 22/31  brain]
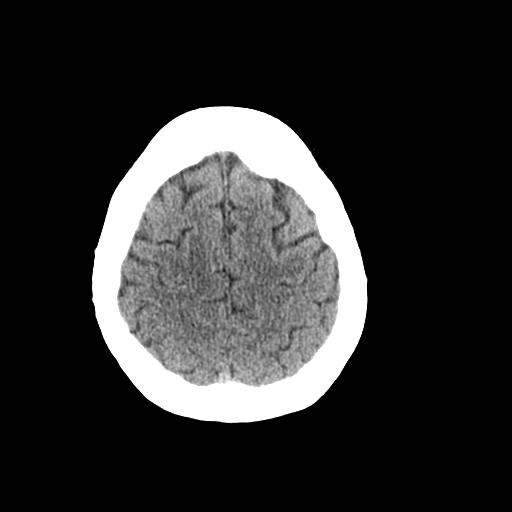
[im 25/31  brain]
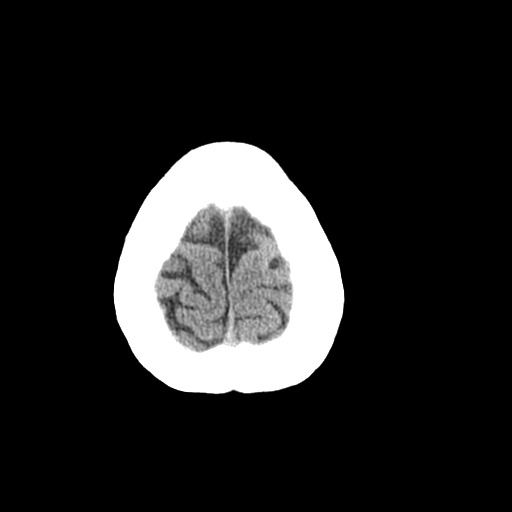
[im 28/31  brain]
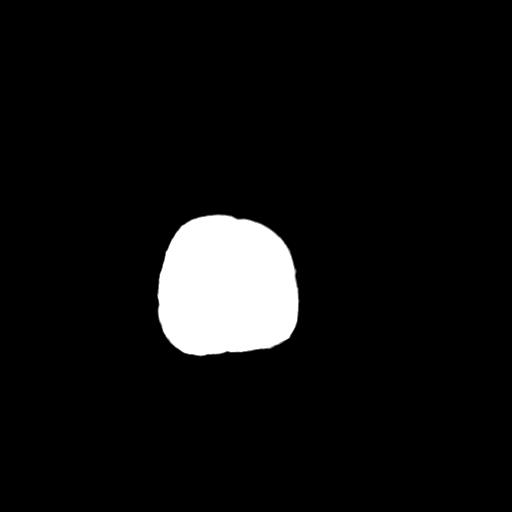
[im 28/31  bone]
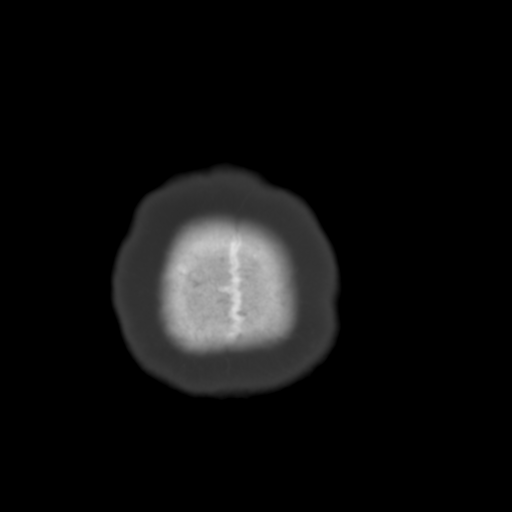

[Series 5: coronal soft tissue · coronal · 0.30mm/px · 3 of 62 slices shown]
[im 21/62  brain]
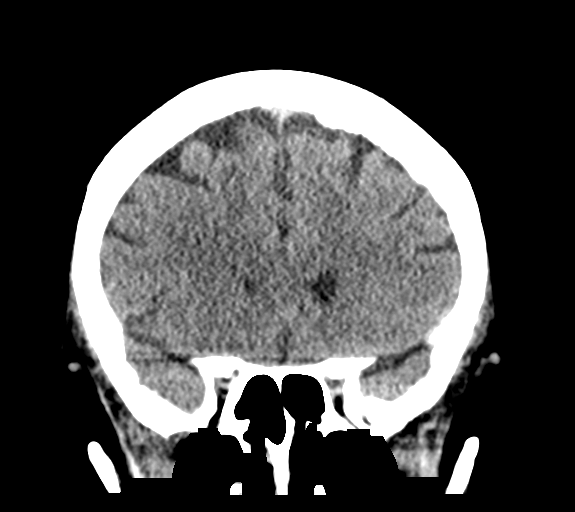
[im 28/62  brain]
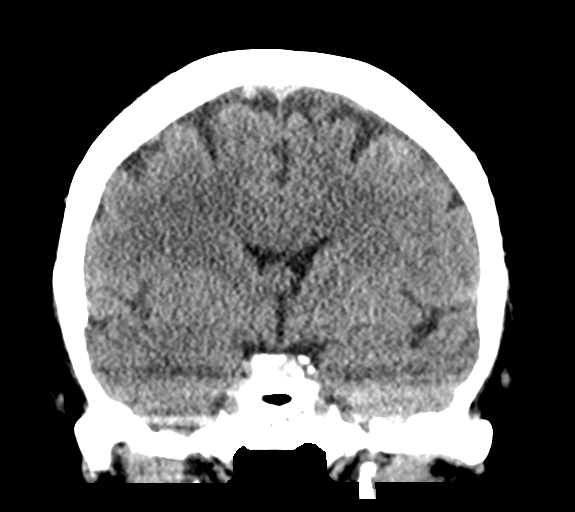
[im 34/62  brain]
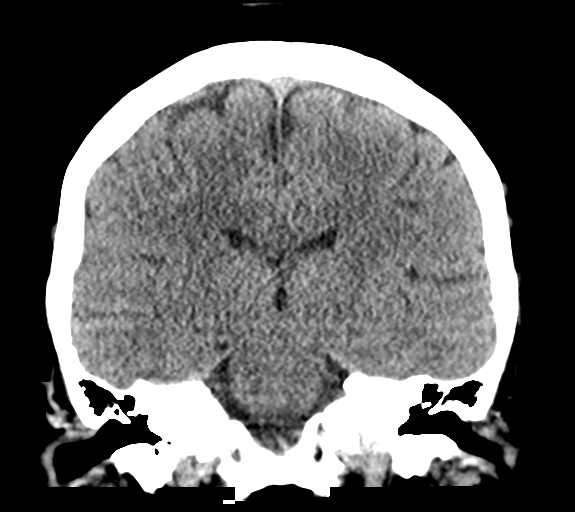

[Series 6: sagittal soft tissue · sagittal · 0.31mm/px · 3 of 59 slices shown]
[im 20/59  brain]
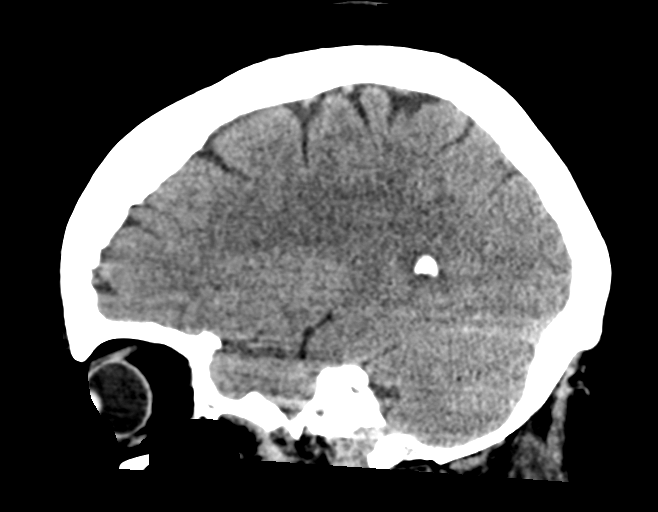
[im 30/59  brain]
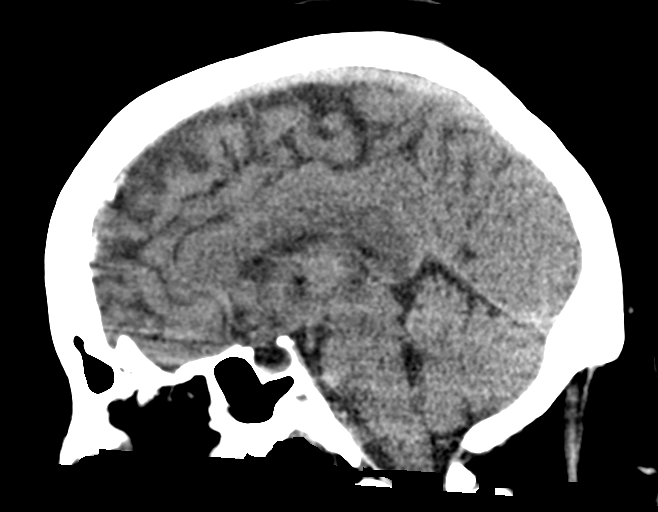
[im 39/59  brain]
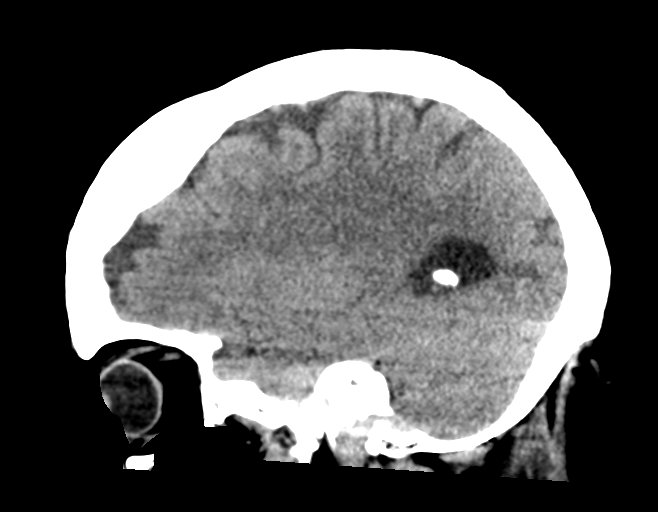

[15 of 47 positions shown; findings below may reference images not displayed]

FINDINGS: Brain: Remote left occipital infarct with encephalomalacia,
unchanged. No intracranial hemorrhage, mass effect, or midline
shift. No hydrocephalus. The basilar cisterns are patent. No
evidence of territorial infarct or acute ischemia. No extra-axial or
intracranial fluid collection.

Vascular: No hyperdense vessel or unexpected calcification.

Skull: No fracture or focal lesion.

Sinuses/Orbits: Ethmoid air cell mucosal thickening, unchanged from
prior exam. No sinus fluid levels. Mastoid air cells are clear.

Other: None.
IMPRESSION: No acute intracranial abnormality.  Remote left occipital infarct.

## 2020-06-05 IMAGING — CT CT HEAD W/O CM
3 series · 14 of 47 positions shown, 16 images · non-contrast
Comparison: CT head without contrast 05/02/2018.

CLINICAL DATA: Neuro deficits, subacute. Hit head on brick this
morning. Subsequent falls and difficulty standing [place].

EXAM:
CT HEAD WITHOUT CONTRAST
TECHNIQUE: Contiguous axial images were obtained from the base of the skull
through the vertex without intravenous contrast.

[Series 3: head wo · axial · 0.40mm/px · z∈[-163,-38]mm · 8 of 30 slices shown, 10 images]
[im 3/30  brain]
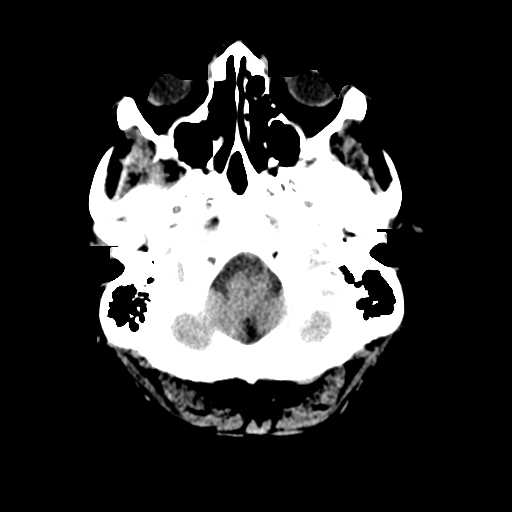
[im 3/30  bone]
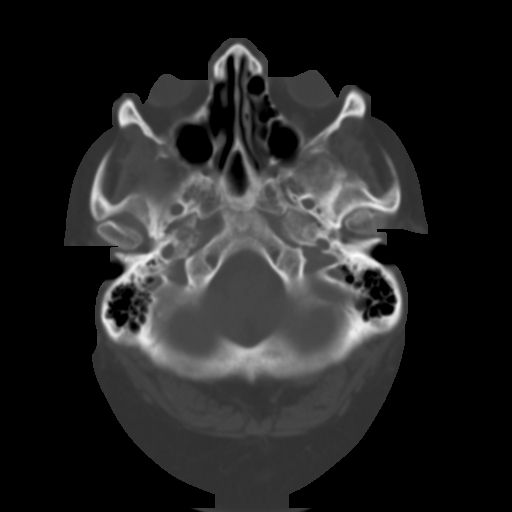
[im 7/30  brain]
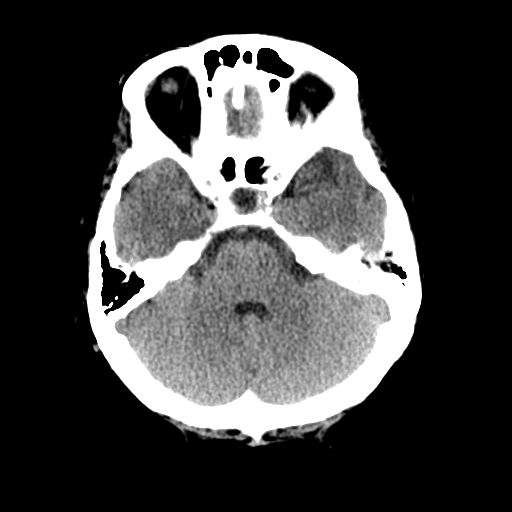
[im 10/30  brain]
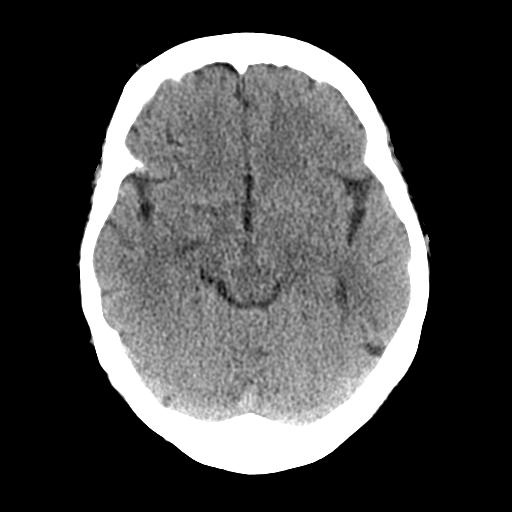
[im 14/30  brain]
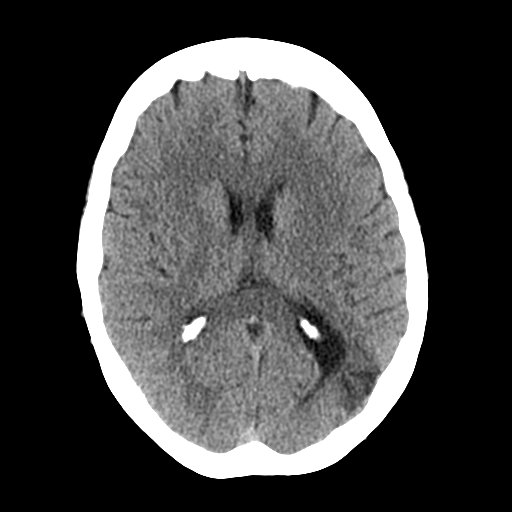
[im 17/30  brain]
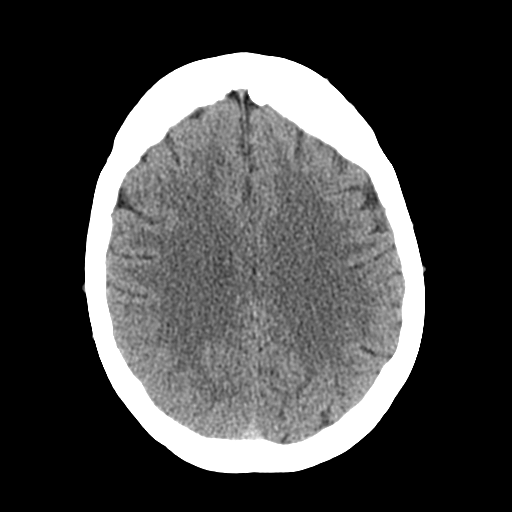
[im 17/30  bone]
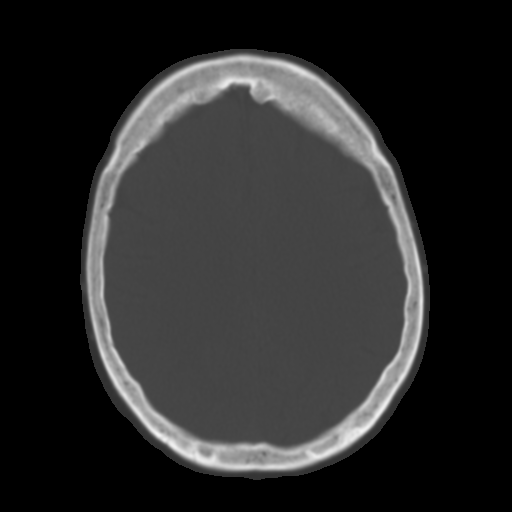
[im 21/30  brain]
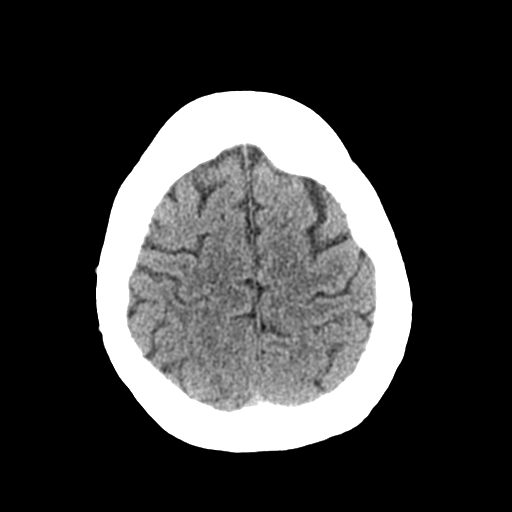
[im 24/30  brain]
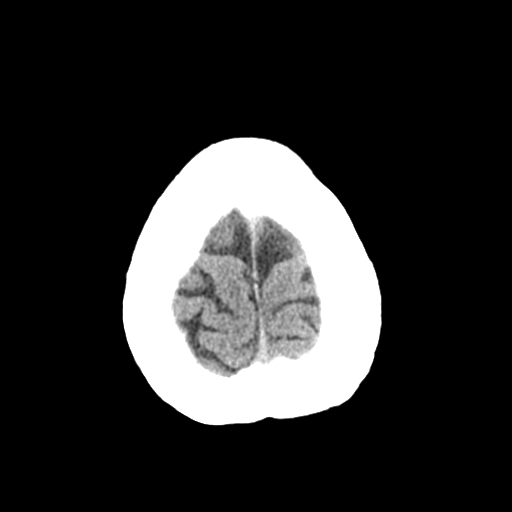
[im 28/30  brain]
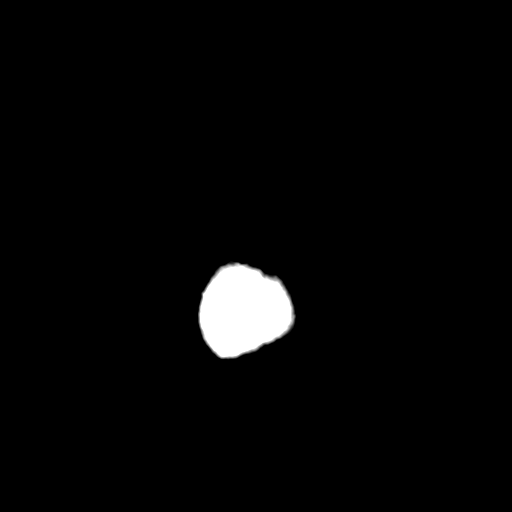

[Series 4: coronal soft tissue · coronal · 0.28mm/px · 3 of 62 slices shown]
[im 21/62  brain]
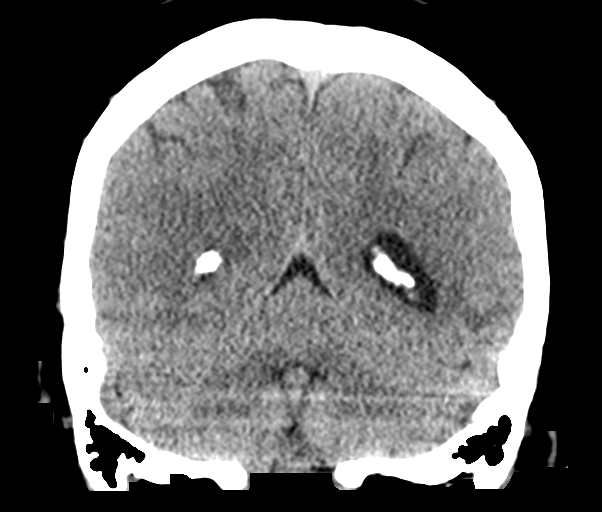
[im 28/62  brain]
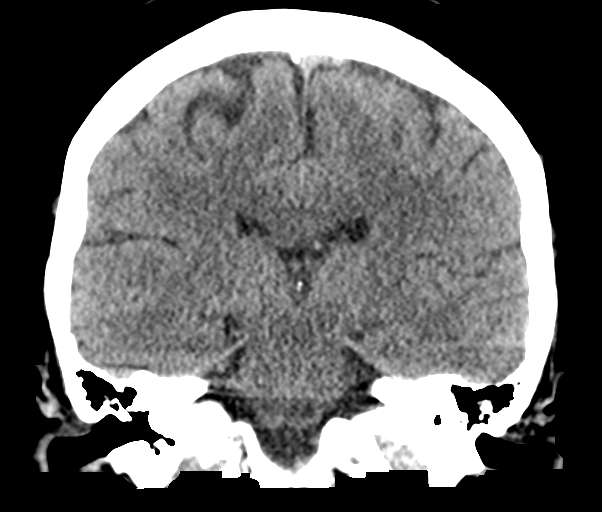
[im 34/62  brain]
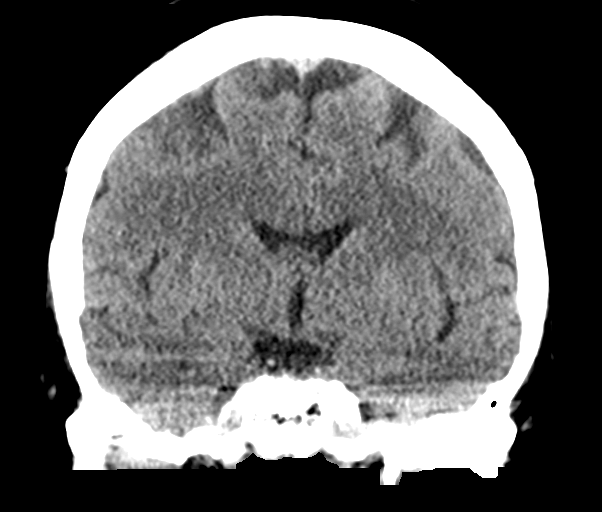

[Series 5: sagittal soft tissue · sagittal · 0.28mm/px · 3 of 52 slices shown]
[im 18/52  brain]
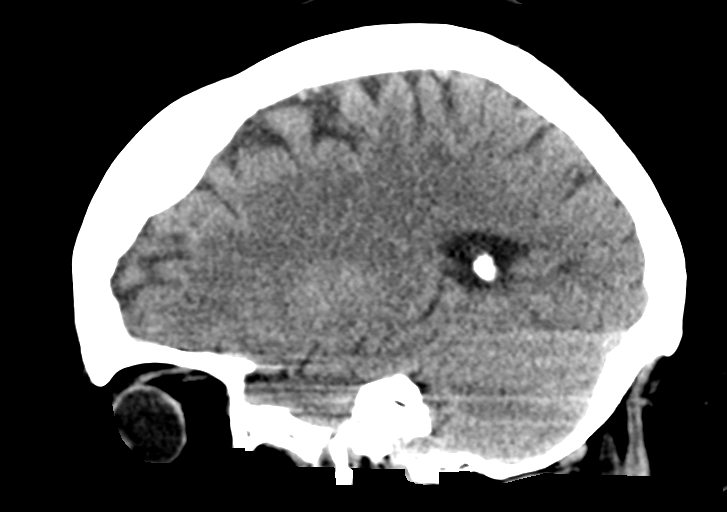
[im 26/52  brain]
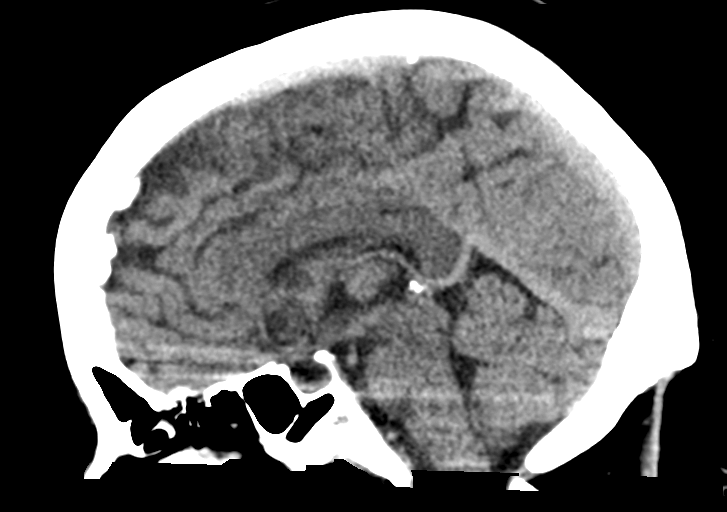
[im 35/52  brain]
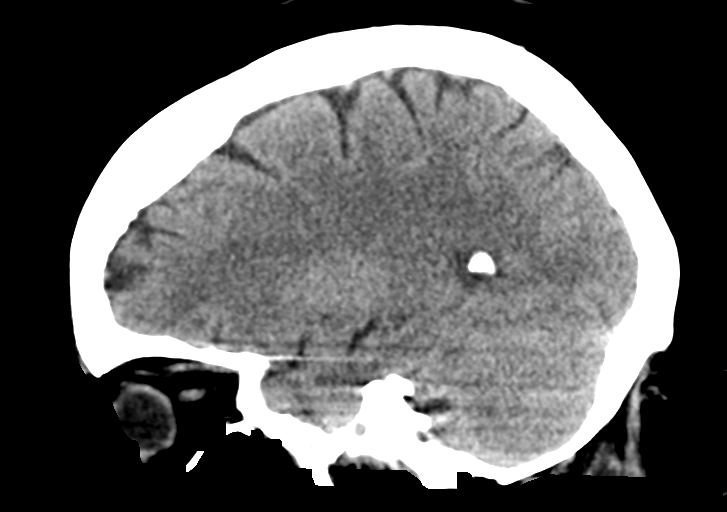

[14 of 47 positions shown; findings below may reference images not displayed]

FINDINGS: Brain: The remote posterior left temporal and occipital lobe infarct
is again seen. Associated ex vacuo dilation of the left lateral
ventricle is noted. No acute infarct, hemorrhage, or mass lesion is
present. The ventricles are of normal size. No significant
extra-axial fluid collection present.

Vascular: Atherosclerotic calcifications are present within the
cavernous internal carotid arteries bilaterally. There is no
hyperdense vessel.

Skull: No focal lytic or blastic lesions are present. No acute
fractures present. No significant extracranial soft tissue injury is
present.

Sinuses/Orbits: Postsurgical changes are noted in the right ethmoid
air cells and right maxillary sinus. Minimal mucosal thickening is
present in the residual right ethmoid air cells. The paranasal
sinuses and mastoid air cells are otherwise clear. Globes and orbits
are within normal limits.
IMPRESSION: 1. Stable remote posterior left temporal and occipital lobe infarct.
2. No acute intracranial abnormality or significant interval change.
3. Postsurgical changes of the right ethmoid air cells and maxillary
sinus without significant interval change.

## 2020-06-05 IMAGING — CR DG CHEST 2V
2 series · 2 of 2 positions shown · non-contrast
Comparison: Radiographs May 01, 2018.

CLINICAL DATA: Dizziness.

EXAM:
CHEST - 2 VIEW

[chest lat]
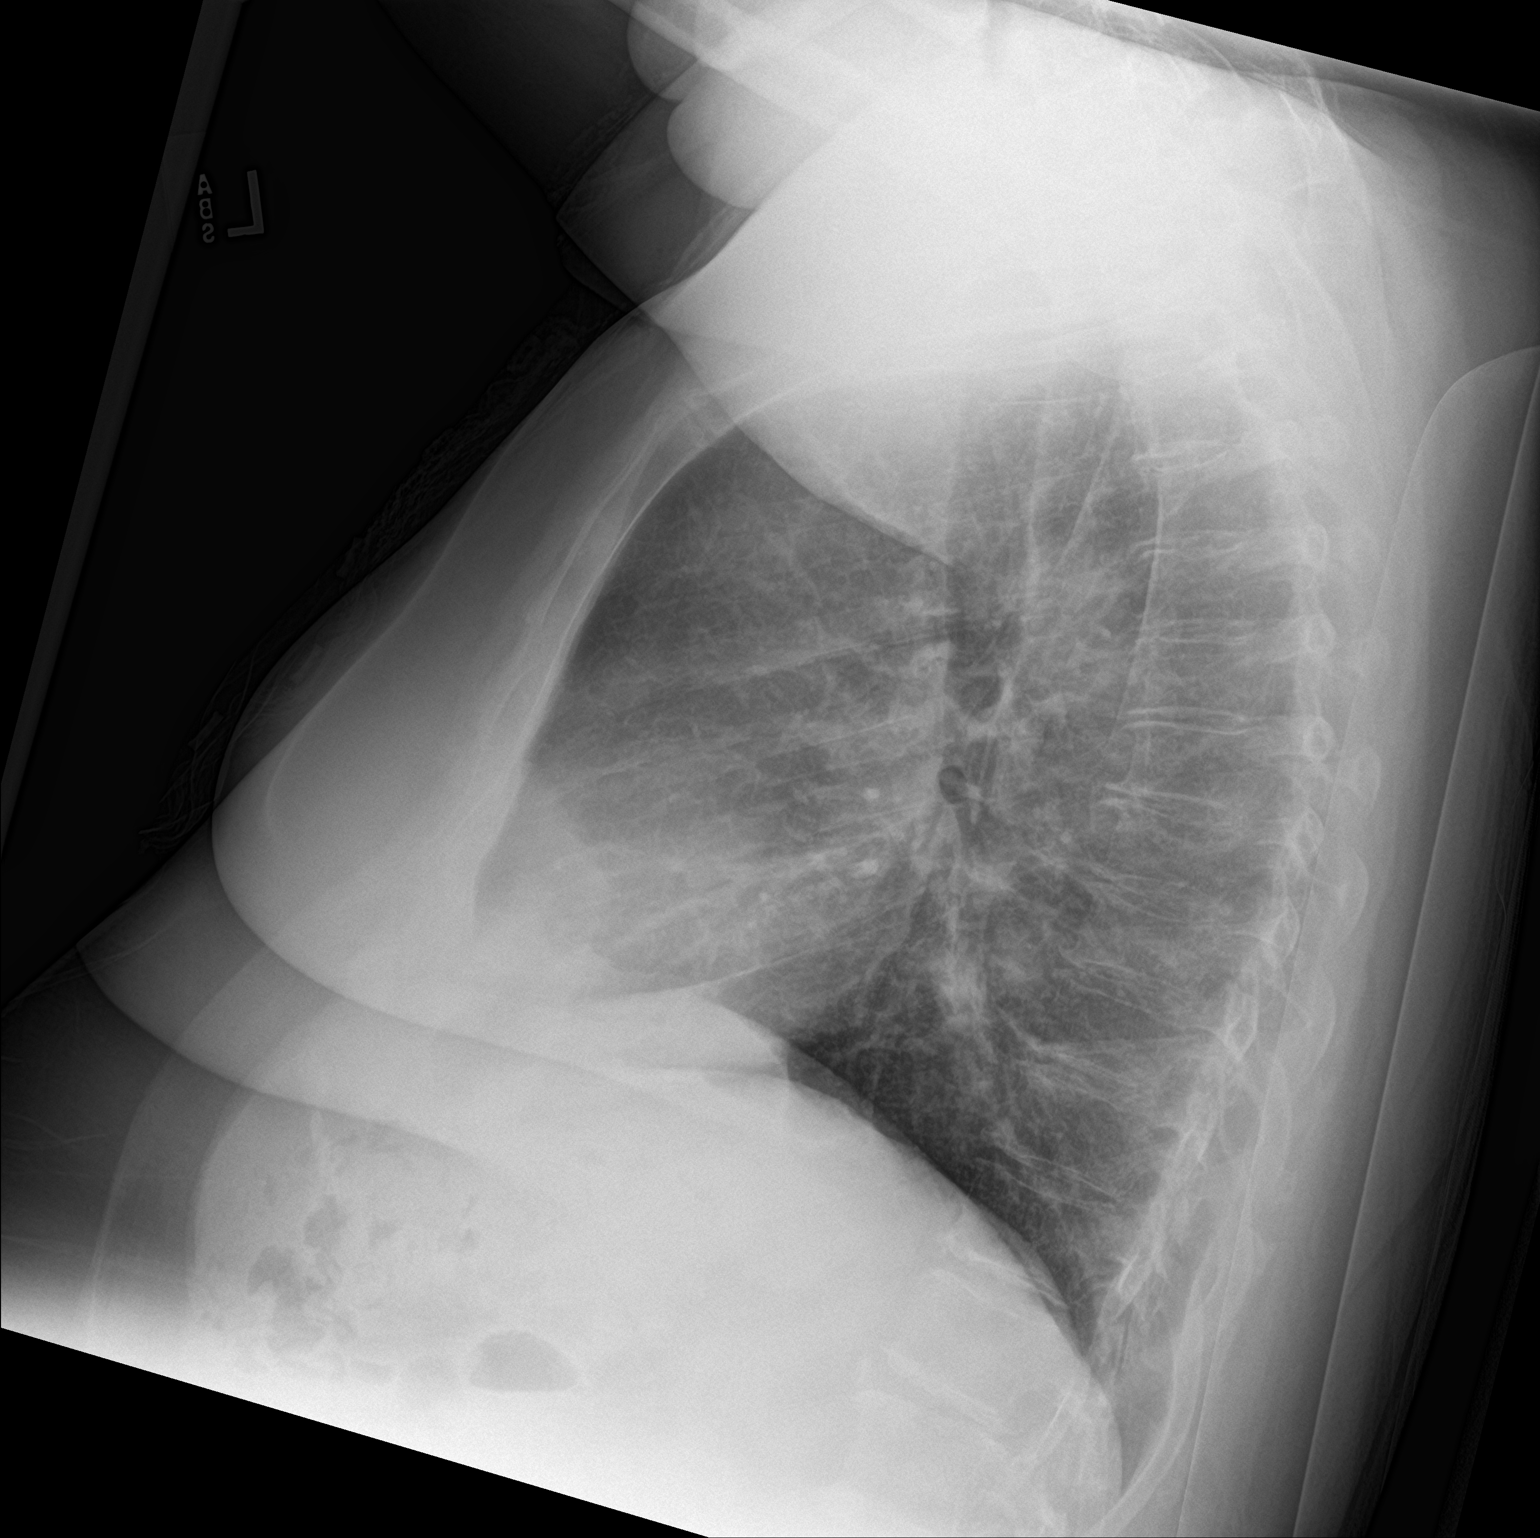

[chest ap]
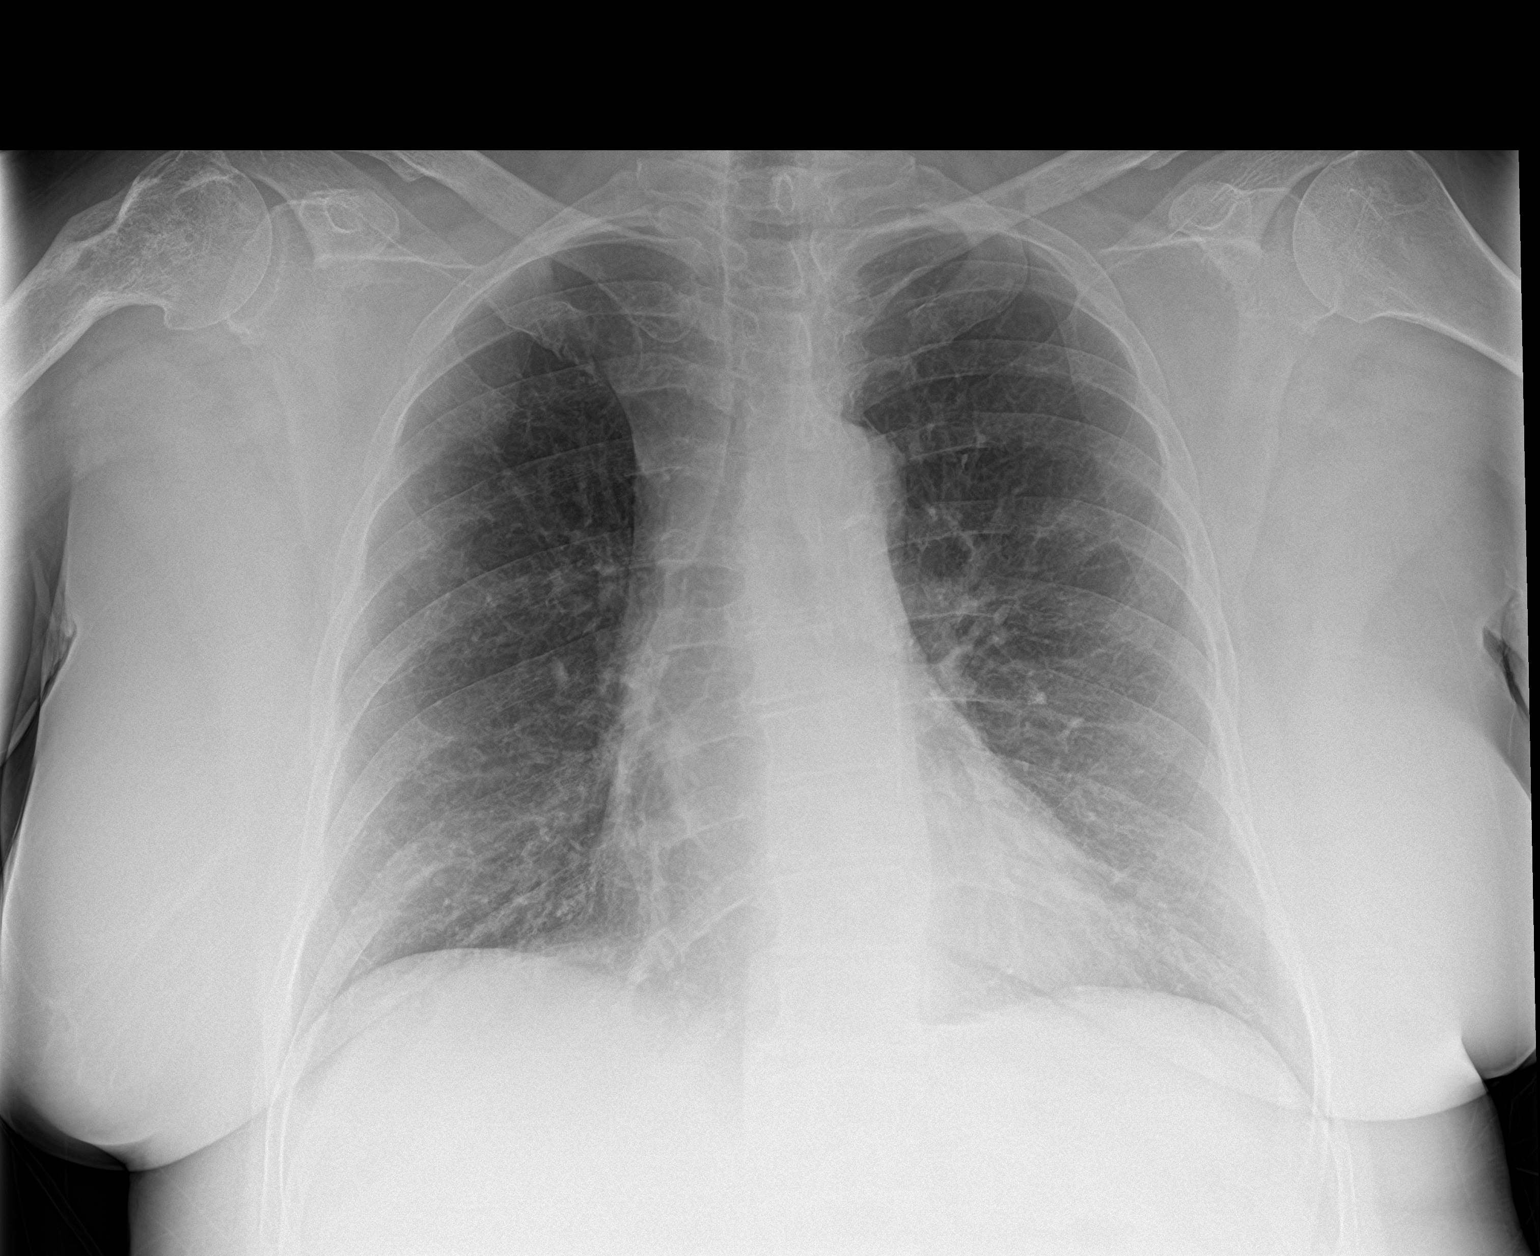

[2 of 2 positions shown; findings below may reference images not displayed]

FINDINGS: The heart size and mediastinal contours are within normal limits.
Both lungs are clear. No pneumothorax or pleural effusion is noted.
The visualized skeletal structures are unremarkable.
IMPRESSION: No active cardiopulmonary disease.

## 2020-06-08 ENCOUNTER — Ambulatory Visit: Payer: 59 | Admitting: Neurology

## 2020-06-13 ENCOUNTER — Telehealth: Payer: Self-pay | Admitting: Neurology

## 2020-06-13 NOTE — Telephone Encounter (Signed)
Pt called, been to the ER last 5 weeks. I';m taking 2 tablets of Ubrogepant (UBRELVY) 50 MG TABS, taking 120 mg EMGALITY 120 MG/ML SOSY and nothing is working.  Would like a call from the nurse.

## 2020-06-13 NOTE — Telephone Encounter (Signed)
Called patient and advised Dr Marjory Lies, work in [place] for Dr Anne Hahn wants her to have VV or office FU with Dr Anne Hahn or NP to discuss migraines and medications. She agreed to VV, and I sent e mail for her to activate. Dr Anne Hahn and Karren Burly NP didn't have openings until Nov.  Scheduled her with Dolores Hoose, NP for Thurs. Patient verbalized understanding, appreciation.

## 2020-06-13 NOTE — Telephone Encounter (Signed)
Recommend follow up visit (video or in person) with Dr. Anne Hahn or Margie Ege, NP to evaluate further. -VRP

## 2020-06-15 ENCOUNTER — Telehealth (INDEPENDENT_AMBULATORY_CARE_PROVIDER_SITE_OTHER): Payer: Medicaid Other | Admitting: Adult Health

## 2020-06-15 DIAGNOSIS — G43719 Chronic migraine without aura, intractable, without status migrainosus: Secondary | ICD-10-CM | POA: Diagnosis not present

## 2020-06-15 DIAGNOSIS — R519 Headache, unspecified: Secondary | ICD-10-CM | POA: Diagnosis not present

## 2020-06-15 NOTE — Progress Notes (Signed)
  [facility] [address] [place] [place] [address] [phone]     Virtual Visit via Telephone Note  I connected with Stephanie [name] on 06/15/20 at  9:00 AM EDT by telephone located remotely at [facility] and verified that I am speaking with the correct person using two identifiers who reports being located at home.    Visit scheduled by RN. She discussed the limitations, risks, security and privacy concerns of performing an evaluation and management service by telephone and the availability of in person appointments. I also discussed with the patient that there may be a patient responsible charge related to this service. The patient expressed understanding and agreed to proceed. See telephone note for consent and additional scheduling information.    History of Present Illness:  Stephanie [name] is a 54 y.o. female who has been followed in this office for migraines was initially scheduled for face-to-face office follow up visit today time but due to COVID19, visit rescheduled for non-face-to-face telephone visit with patients consent. Unable to participate in video visit due to lack of access to device with camera.    Stephanie [name] is a 54 year old female with a history of migraine headaches.  She was unable to join me through a video visit.  She states that over the last several months her headaches have gotten worse.  Reports that she has daily headaches.  Reports that she gets a severe headache approximately 2 times a week.  Her headaches are located on the right side of the head down to the neck.  She does report photophobia and phonophobia as well as nausea.  She has been on Emgality but does not feel it is helping any longer.  She also has Ubrelvy 50 mg that she reports is not helpful.  She states that she is been also having tachycardia has an ultrasound scheduled for Friday.  She currently has a 3 mm kidney stone.  The patient is also on CPAP  for sleep apnea but according to the patient no one is managing this.     Observations/Objective:  Generalized: Well developed, in no acute distress   Neurological examination  Mentation: Alert oriented to time, place, history taking. Follows all commands speech and language fluent  Assessment and Plan:  1: Migraine headaches   Stop Emgality.  Start Ajovy-she will take her first dose next month when her Emgality would have been due.  Okay to increase Ubrelvy to 100 mg at the onset of a migraine and can repeat once if needed.  Advised patient to bring her CPAP chip to our office for my review  Follow-up in 2 to 3 months or sooner if needed   Follow Up Instructions:   F/U in 2 to 3 months    I discussed the assessment and treatment plan with the patient.  The patient was provided an opportunity to ask questions and all were answered to their satisfaction. The patient agreed with the plan and verbalized an understanding of the instructions.   I spent 30  minutes of telephone and non-face-to-face time with patient.  This included previsit chart review, lab review, study review, order entry, electronic health record documentation, patient education.     Butch Penny NP-C  [facility] [address] [place], [place] [address]  Phone [phone] Fax [phone] \

## 2020-06-16 NOTE — Progress Notes (Signed)
I have read the note, and I agree with the clinical assessment and plan.  Temari Schooler K Victoire Deans   

## 2020-06-19 ENCOUNTER — Other Ambulatory Visit: Payer: Self-pay | Admitting: Adult Health

## 2020-06-19 NOTE — Telephone Encounter (Signed)
Pt is needing a refill on her lamoTRIgine (LAMICTAL) 100 MG tablet and her levETIRAcetam (KEPPRA) 500 MG tablet sent in to the [facility] on [address].

## 2020-06-20 MED ORDER — LAMOTRIGINE 100 MG PO TABS: 100.0000 mg | ORAL_TABLET | Freq: Two times a day (BID) | ORAL | 3 refills | Status: DC

## 2020-06-20 MED ORDER — LEVETIRACETAM 500 MG PO TABS: 500.0000 mg | ORAL_TABLET | Freq: Two times a day (BID) | ORAL | 3 refills | Status: DC

## 2020-06-20 NOTE — Telephone Encounter (Signed)
Is it okay for pt to have enough refills to last a year?

## 2020-06-23 ENCOUNTER — Ambulatory Visit: Payer: Medicaid Other | Admitting: Urology

## 2020-06-28 ENCOUNTER — Ambulatory Visit: Payer: Medicaid Other | Admitting: Urology

## 2020-06-29 ENCOUNTER — Ambulatory Visit: Payer: Medicaid Other | Admitting: Urology

## 2020-06-30 ENCOUNTER — Ambulatory Visit
Admission: RE | Admit: 2020-06-30 | Discharge: 2020-06-30 | Disposition: A | Discharge status: Home / Self Care | Payer: Medicaid Other | Source: Ambulatory Visit | Attending: Urology | Admitting: Urology

## 2020-06-30 ENCOUNTER — Encounter: Payer: Self-pay | Admitting: Urology

## 2020-06-30 ENCOUNTER — Ambulatory Visit
Admission: RE | Admit: 2020-06-30 | Discharge: 2020-06-30 | Disposition: A | Discharge status: Home / Self Care | Payer: Medicaid Other | Attending: Urology | Admitting: Urology

## 2020-06-30 ENCOUNTER — Ambulatory Visit (INDEPENDENT_AMBULATORY_CARE_PROVIDER_SITE_OTHER): Payer: Medicaid Other | Admitting: Urology

## 2020-06-30 ENCOUNTER — Other Ambulatory Visit: Payer: Self-pay

## 2020-06-30 VITALS — BP 122/82 | HR 80 | Ht 62.0 in | Wt 184.0 lb

## 2020-06-30 DIAGNOSIS — N200 Calculus of kidney: Secondary | ICD-10-CM | POA: Diagnosis not present

## 2020-06-30 DIAGNOSIS — R8281 Pyuria: Secondary | ICD-10-CM

## 2020-06-30 NOTE — Progress Notes (Signed)
06/30/2020 1:18 PM   Stephanie [name] [birth year] [id number]  Referring provider: Fayrene Helper, NP [address] [place],  [place] [address] Chief Complaint  Patient presents with  . Nephrolithiasis    HPI: Stephanie [name] is a 54 y.o. female who presents today for evaluation and management of nephrolithiasis.   -History recurrent stone disease followed by Dr. Marcy Salvo at [facility] -States that she was hospitalized at [facility] 7/2-7/4 with right flank pain and UTI and was told she has a 2 cm stone -Review of records at [facility] has no hospitalization in July 2021 however she was hospitalized 9/2-9/4 after syncopal episode and fall.  She had a CT scan performed at that visit which showed nonobstructing renal calculi -She presented to the ED at [facility] on 05/09/2020 with complaints of flank pain (side not specified)  -CT A/P w/o contrast showed no acute intra-abdominal pathology. Bilateral nonobstructing renal calculi with similar burden. No evidence of bilateral hydronephrosis.   -She reports persistent right flank pain for a few months now. -Pain is radiating to the RLQ. She feels dizzy upon standing.  -She has baseline frequency, urgency and urge incontinence -Denies fever, chills.  She has nausea without vomiting -Last stone procedure was ureteroscopic removal of a right ureteral calculus at [facility] 03/2019  PMH: Past Medical History:  Diagnosis Date  . Diabetes mellitus without complication (HCC)   . Difficult intubation   . Diverticulitis   . Headache   . Hepatitis C    treated and resolved  . History of kidney stones   . Hypertension   . IBS (irritable bowel syndrome)   . Kidney stones   . Sciatica   . Sleep apnea    on cpap  . Stroke [facility]) 2017   memory loss    Surgical History: Past Surgical History:  Procedure Laterality Date  . ABDOMINAL HYSTERECTOMY    . carpel tunnel syndrome  2017  . HARDWARE REMOVAL Right 12/22/2017   Procedure: HARDWARE REMOVAL-RIGHT ARM;   Surgeon: Lyndle Herrlich, MD;  Location: [facility] ORS;  Service: Orthopedics;  Laterality: Right;  Removal of implant, superficial right humerus  . HUMERUS IM NAIL Right 03/11/2017   Procedure: INTRAMEDULLARY (IM) NAIL HUMERAL;  Surgeon: Lyndle Herrlich, MD;  Location: [facility] ORS;  Service: Orthopedics;  Laterality: Right;  . KIDNEY STONE SURGERY Right 02/12/12  . KIDNEY STONE SURGERY Left 07/15/2012  . LIGATION OF ARTERIOVENOUS  FISTULA Left 06/21/2016   Procedure: LIGATION OF ARTERIOVENOUS  FISTULA ( LIGATION BASILIC VEIN );  Surgeon: Renford Dills, MD;  Location: [facility] ORS;  Service: Vascular;  Laterality: Left;  . LITHOTRIPSY    . OOPHORECTOMY    . SINUS EXPLORATION    . TONSILLECTOMY      Home Medications:  Allergies as of 06/30/2020      Reactions   Gabapentin Itching   Ibuprofen Itching   Tylenol [acetaminophen] Itching   Aspirin Itching   Buprenorphine Hcl Itching   Carbamazepine Itching, Other (See Comments), Rash   Makes her feel like something is crawling under her skin.   Codeine Itching, Other (See Comments)   Makes her feel like something is crawling under her skin. Can take, sometimes makes her itch   Compazine Itching, Other (See Comments)   "makes my skin crawl"   Ioxaglate Itching   Ivp Dye [iodinated Diagnostic Agents] Itching, Other (See Comments)   Makes her itch really bad. Had to get 2-3 shots of Benadryl when she was in the  hospital.   Metrizamide Itching   Naproxen Itching   Norco [hydrocodone-acetaminophen] Itching   Other Itching, Other (See Comments)   Makes her itch really bad. Had to get 2-3 shots of Benadryl when she was in the hospital.   Penicillin G Hives, Rash, Other (See Comments)   Has patient had a PCN reaction causing immediate rash, facial/tongue/throat swelling, SOB or lightheadedness with hypotension: Yes Has patient had a PCN reaction causing severe rash involving mucus membranes or skin necrosis: No Has patient had a PCN reaction that  required hospitalization: No Has patient had a PCN reaction occurring within the last 10 years: No If all of the above answers are "NO", then may proceed with Cephalosporin use.   Prochlorperazine Maleate Other (See Comments)   Compazine makes her skin crawl - needs 2-3 shots of Benadryl to get relief.   Reglan [metoclopramide] Itching, Other (See Comments)   Makes her skin crawl   Sulfur Hives, Rash, Other (See Comments)   Skin Rashes, Hives   Toradol [ketorolac Tromethamine] Itching   Tramadol Itching   Zofran [ondansetron Hcl] Itching      Medication List       Accurate as of June 30, 2020  1:18 PM. If you have any questions, ask your nurse or doctor.        alprazolam 2 MG tablet Commonly known as: XANAX Take 2 mg by mouth 2 (two) times daily as needed for anxiety.   budesonide-formoterol 160-4.5 MCG/ACT inhaler Commonly known as: SYMBICORT Inhale 2 puffs into the lungs 2 (two) times daily.   chlorthalidone 25 MG tablet Commonly known as: HYGROTON Take 25 mg by mouth daily.   cholecalciferol 25 MCG (1000 UNIT) tablet Commonly known as: VITAMIN D3 Take 1,000 Units by mouth daily.   cloNIDine 0.2 MG tablet Commonly known as: CATAPRES Take 0.2 mg by mouth 2 (two) times daily.   clopidogrel 75 MG tablet Commonly known as: PLAVIX Take 1 tablet (75 mg total) by mouth daily.   dexamethasone 2 MG tablet Commonly known as: DECADRON Take 1 tablet (2 mg total) by mouth 2 (two) times daily with a meal.   Emgality 120 MG/ML Sosy Generic drug: Galcanezumab-gnlm Inject 120 mg into the skin every 30 (thirty) days.   etodolac 300 MG capsule Commonly known as: LODINE Take 1 capsule (300 mg total) by mouth 3 (three) times daily as needed (pain).   FLUoxetine 40 MG capsule Commonly known as: PROZAC Take 40 mg by mouth daily.   glipiZIDE 5 MG tablet Commonly known as: GLUCOTROL Take 1 tablet (5 mg total) by mouth 2 (two) times daily before a meal.   hydrALAZINE 100  MG tablet Commonly known as: APRESOLINE Take 100 mg by mouth 3 (three) times daily.   ibuprofen 200 MG tablet Commonly known as: ADVIL Take 800 mg by mouth every 6 (six) hours as needed for headache.   lamoTRIgine 100 MG tablet Commonly known as: LaMICtal Take 1 tablet (100 mg total) by mouth 2 (two) times daily.   levETIRAcetam 500 MG tablet Commonly known as: Keppra Take 1 tablet (500 mg total) by mouth 2 (two) times daily.   losartan 100 MG tablet Commonly known as: COZAAR Take 100 mg by mouth daily.   omeprazole 40 MG capsule Commonly known as: PRILOSEC Take 40 mg by mouth daily as needed for heartburn.   pantoprazole 40 MG tablet Commonly known as: PROTONIX Take 40 mg by mouth daily.   promethazine 25 MG tablet Commonly known as:  PHENERGAN Take 1 tablet (25 mg total) by mouth every 6 (six) hours as needed for nausea or vomiting.   traZODone 100 MG tablet Commonly known as: DESYREL Take 100 mg by mouth at bedtime.   Ubrelvy 50 MG Tabs Generic drug: Ubrogepant Take 50 mg by mouth daily as needed.   ZYRTEC PO Take by mouth.       Allergies:  Allergies  Allergen Reactions  . Gabapentin Itching  . Ibuprofen Itching  . Tylenol [Acetaminophen] Itching  . Aspirin Itching  . Buprenorphine Hcl Itching  . Carbamazepine Itching, Other (See Comments) and Rash    Makes her feel like something is crawling under her skin.  . Codeine Itching and Other (See Comments)    Makes her feel like something is crawling under her skin. Can take, sometimes makes her itch  . Compazine Itching and Other (See Comments)    "makes my skin crawl"  . Ioxaglate Itching  . Ivp Dye [Iodinated Diagnostic Agents] Itching and Other (See Comments)    Makes her itch really bad. Had to get 2-3 shots of Benadryl when she was in the hospital.  . Metrizamide Itching  . Naproxen Itching  . Norco [Hydrocodone-Acetaminophen] Itching  . Other Itching and Other (See Comments)    Makes her itch  really bad. Had to get 2-3 shots of Benadryl when she was in the hospital.  . Penicillin G Hives, Rash and Other (See Comments)    Has patient had a PCN reaction causing immediate rash, facial/tongue/throat swelling, SOB or lightheadedness with hypotension: Yes Has patient had a PCN reaction causing severe rash involving mucus membranes or skin necrosis: No Has patient had a PCN reaction that required hospitalization: No Has patient had a PCN reaction occurring within the last 10 years: No If all of the above answers are "NO", then may proceed with Cephalosporin use.   Marland Kitchen Prochlorperazine Maleate Other (See Comments)    Compazine makes her skin crawl - needs 2-3 shots of Benadryl to get relief.  . Reglan [Metoclopramide] Itching and Other (See Comments)    Makes her skin crawl  . Sulfur Hives, Rash and Other (See Comments)    Skin Rashes, Hives  . Toradol [Ketorolac Tromethamine] Itching  . Tramadol Itching  . Zofran [Ondansetron Hcl] Itching    Family History: Family History  Problem Relation Age of Onset  . Heart failure Mother   . Diabetes Father   . Cancer Sister     Social History:  reports that she has been smoking cigarettes. She has a 1.50 pack-year smoking history. She has never used smokeless tobacco. She reports previous alcohol use. She reports that she does not use drugs.   Physical Exam: BP 122/82   Pulse 80   Ht 5\' 2"  (1.575 m)   Wt 184 lb (83.5 kg)   BMI 33.65 kg/m   Constitutional:  Alert and oriented, No acute distress. HEENT: [place] AT, moist mucus membranes.  Trachea midline, no masses. Cardiovascular: No clubbing, cyanosis, or edema. Respiratory: Normal respiratory effort, no increased work of breathing. Skin: No rashes, bruises or suspicious lesions. Neurologic: Grossly intact, no focal deficits, moving all 4 extremities. Psychiatric: Normal mood and affect.  Laboratory Data:  Lab Results  Component Value Date   CREATININE 0.79 04/20/2020     Urinalysis Dipstick 1+ leukocytes/microscopy 11-30 WBC  Pertinent Imaging: Interface, Rad Results In - 05/09/2020 3:29 PM EDT  Formatting of this note might be different from the original.  EXAM: CT ABDOMEN  PELVIS WO CONTRAST  DATE: 05/08/2020 11:53 PM  ACCESSION: [id number]  DICTATED: 05/09/2020 12:06 AM  INTERPRETATION LOCATION: [facility]   CLINICAL INDICATION: Possible kidney stone ; Flank pain, kidney stone suspected    COMPARISON: 10/30/2019.   TECHNIQUE: A spiral CT scan of the abdomen and pelvis was obtained without IV contrast from the lung bases through the pubic symphysis. Images were reconstructed in the axial plane. Coronal and sagittal reformatted images were also provided for further evaluation.   FINDINGS:   Evaluation of the solid organs and vasculature is limited in the absence of intravenous contrast.   LOWER THORAX: Unchanged pleural-based noncalcified pulmonary micronodule in the posterior left lung (series 2, image 15)   HEPATOBILIARY: No focal hepatic lesions. Fatty infiltration of the falciform ligament. Gallbladder is contracted, limiting evaluation. No biliary dilatation.    SPLEEN: Unremarkable.   PANCREAS: Unremarkable.   ADRENALS: Unchanged nodularity of the left adrenal gland. The right adrenal gland is unremarkable.   KIDNEYS/URETERS: Bilateral nonobstructing renal calculi withsimilar burden. There are also bilateral cortically-based calculi, similar to prior. No hydronephrosis. The right kidney is smaller. Bilateral cortical scarring, right greater than left. The ureters are decompressed.   BLADDER: Underdistended, limitingevaluation.   PELVIC/REPRODUCTIVE ORGANS: Uterus is surgically absent. No adnexal masses.   GI TRACT: No obstructed bowel loops are identified. The appendix is unremarkable.   PERITONEUM/RETROPERITONEUM AND MESENTERY: No free air or fluid.   VESSELS: Normal in caliber. Mild atherosclerotic disease of the aorta  and its branch vessels.   BONES AND SOFT TISSUES: No aggressive or acute osseous lesions identified. There are multilevel degenerative changes in the included spine.    IMPRESSION:  1.No acute intra-abdominal pathology.  2.Bilateral nonobstructing renal calculi with similar burden. No evidence of bilateral hydronephrosis.  I have personally reviewed the images.    Assessment & Plan:    1.  Bilateral nephrolithiasis   At the time of our initial visit I did not have access to the [facility] accessed after our visit and notes/CTs from August 2021 and September 2021 for reviewed.  She has chronic right flank pain however CTs show a parenchymal calcification in the periphery of the right kidney associated with a cortical scar.  There are minute, small calcifications bilaterally  These findings should not be a source of her chronic right flank pain and I would not recommend ureteroscopy.  She will be notified with these findings and if desires further evaluation/treatment would recommend follow-up with Dr. Marcy Salvo at [facility]  2.  Pyuria  Urine culture was ordered  I spent 45 total minutes on the day of the encounter including pre-visit review of the medical record, face-to-face time with the patient, and post visit ordering of labs/imaging/tests.   [facility] [address] [place], [place] [address] [phone]  I, Theador Hawthorne, am acting as a scribe for Dr. Lorin Picket C. Rut Betterton,  I have reviewed the above documentation for accuracy and completeness, and I agree with the above.    Riki Altes, MD

## 2020-07-01 ENCOUNTER — Encounter: Payer: Self-pay | Admitting: Urology

## 2020-07-01 LAB — MICROSCOPIC EXAMINATION

## 2020-07-01 LAB — URINALYSIS, COMPLETE
Bilirubin, UA: NEGATIVE
Glucose, UA: NEGATIVE
Ketones, UA: NEGATIVE
Nitrite, UA: NEGATIVE
Protein,UA: NEGATIVE
RBC, UA: NEGATIVE
Specific Gravity, UA: 1.015 (ref 1.005–1.030)
Urobilinogen, Ur: 1 mg/dL (ref 0.2–1.0)
pH, UA: 7 (ref 5.0–7.5)

## 2020-07-03 ENCOUNTER — Telehealth: Payer: Self-pay | Admitting: Urology

## 2020-07-03 LAB — CULTURE, URINE COMPREHENSIVE

## 2020-07-03 NOTE — Telephone Encounter (Signed)
Left message for patient to call back  

## 2020-07-03 NOTE — Telephone Encounter (Signed)
Notified patient as instructed, patient pleased. Patient agrees

## 2020-07-03 NOTE — Telephone Encounter (Signed)
Please let patient know I reviewed her recent CTs and she has small, nonobstructing calculi in her right kidney which would not be causing flank pain.  I would not recommend operative intervention.  She is an established patient of Dr. Marcy Salvo at [facility] u[facility] and would recommend she follow-up with him if she wants to pursue ureteroscopic removal of these calculi.  Urine culture was negative for infection

## 2020-07-11 ENCOUNTER — Telehealth: Payer: Self-pay | Admitting: Adult Health

## 2020-07-11 NOTE — Telephone Encounter (Signed)
Pt asking for approval status of her Imitrex and Nurtec, please call.

## 2020-07-19 NOTE — Telephone Encounter (Signed)
Pt. Is calling to request again Imitrex & Nurtec. Please advise.  Pharmacy: [facility] [phone]

## 2020-07-20 MED ORDER — AJOVY 225 MG/1.5ML ~~LOC~~ SOAJ: 225.0000 mg | SUBCUTANEOUS | 3 refills | Status: DC

## 2020-07-20 MED ORDER — NURTEC 75 MG PO TBDP: ORAL_TABLET | ORAL | 11 refills | Status: DC

## 2020-07-20 NOTE — Addendum Note (Signed)
Addended by: Enedina Finner on: 07/20/2020 11:47 AM   Modules accepted: Orders

## 2020-07-20 NOTE — Addendum Note (Signed)
Addended by: Guy Begin on: 07/20/2020 10:03 AM   Modules accepted: Orders

## 2020-07-20 NOTE — Telephone Encounter (Signed)
Called pt.  She states that she asked about trying nurtec. The ubrelvy taking 100mg  dose did not help.  I will place the ajovy order as listed in last ofv note (VV) and ask her about nurtec. I started rx) She appreciated call back.

## 2020-07-20 NOTE — Telephone Encounter (Signed)
Order sent for Nurtec

## 2020-07-22 ENCOUNTER — Other Ambulatory Visit: Payer: Self-pay

## 2020-07-22 ENCOUNTER — Emergency Department (HOSPITAL_COMMUNITY)
Admission: EM | Admit: 2020-07-22 | Discharge: 2020-07-22 | Disposition: A | Discharge status: Home / Self Care | Payer: Medicaid Other | Attending: Emergency Medicine | Admitting: Emergency Medicine

## 2020-07-22 ENCOUNTER — Emergency Department (HOSPITAL_COMMUNITY): Payer: Medicaid Other

## 2020-07-22 DIAGNOSIS — E119 Type 2 diabetes mellitus without complications: Secondary | ICD-10-CM | POA: Diagnosis not present

## 2020-07-22 DIAGNOSIS — I10 Essential (primary) hypertension: Secondary | ICD-10-CM | POA: Insufficient documentation

## 2020-07-22 DIAGNOSIS — R519 Headache, unspecified: Secondary | ICD-10-CM | POA: Diagnosis present

## 2020-07-22 DIAGNOSIS — Z79899 Other long term (current) drug therapy: Secondary | ICD-10-CM | POA: Insufficient documentation

## 2020-07-22 DIAGNOSIS — R079 Chest pain, unspecified: Secondary | ICD-10-CM | POA: Insufficient documentation

## 2020-07-22 DIAGNOSIS — R112 Nausea with vomiting, unspecified: Secondary | ICD-10-CM | POA: Diagnosis not present

## 2020-07-22 DIAGNOSIS — Z7901 Long term (current) use of anticoagulants: Secondary | ICD-10-CM | POA: Diagnosis not present

## 2020-07-22 DIAGNOSIS — R1010 Upper abdominal pain, unspecified: Secondary | ICD-10-CM | POA: Diagnosis not present

## 2020-07-22 DIAGNOSIS — G43909 Migraine, unspecified, not intractable, without status migrainosus: Secondary | ICD-10-CM | POA: Diagnosis not present

## 2020-07-22 DIAGNOSIS — F17210 Nicotine dependence, cigarettes, uncomplicated: Secondary | ICD-10-CM | POA: Diagnosis not present

## 2020-07-22 LAB — TROPONIN I (HIGH SENSITIVITY)
Troponin I (High Sensitivity): <2 ng/L (ref <18)
Troponin I (High Sensitivity): <2 ng/L (ref <18)

## 2020-07-22 LAB — CBG MONITORING, ED: Glucose-Capillary: 105 mg/dL — ABNORMAL HIGH (ref 70–99)

## 2020-07-22 LAB — BASIC METABOLIC PANEL
Anion gap: 10 (ref 5–15)
BUN: 12 mg/dL (ref 6–20)
CO2: 25 mmol/L (ref 22–32)
Calcium: 9.5 mg/dL (ref 8.9–10.3)
Chloride: 107 mmol/L (ref 98–111)
Creatinine, Ser: 0.65 mg/dL (ref 0.44–1.00)
GFR, Estimated: >60 mL/min (ref >60)
Glucose, Bld: 92 mg/dL (ref 70–99)
Potassium: 4.3 mmol/L (ref 3.5–5.1)
Sodium: 142 mmol/L (ref 135–145)

## 2020-07-22 LAB — CBC
HCT: 40.2 % (ref 36.0–46.0)
Hemoglobin: 12.5 g/dL (ref 12.0–15.0)
MCH: 27.4 pg (ref 26.0–34.0)
MCHC: 31.1 g/dL (ref 30.0–36.0)
MCV: 88.2 fL (ref 80.0–100.0)
Platelets: 206 10*3/uL (ref 150–400)
RBC: 4.56 MIL/uL (ref 3.87–5.11)
RDW: 18.6 % — ABNORMAL HIGH (ref 11.5–15.5)
WBC: 6.9 10*3/uL (ref 4.0–10.5)
nRBC: 0 % (ref 0.0–0.2)

## 2020-07-22 LAB — I-STAT BETA HCG BLOOD, ED (MC, WL, AP ONLY): I-stat hCG, quantitative: 5.3 m[IU]/mL — ABNORMAL HIGH (ref <5)

## 2020-07-22 MED ORDER — HYDROMORPHONE HCL 1 MG/ML IJ SOLN
1.0000 mg | Freq: Once | INTRAMUSCULAR | Status: AC
  Administered 2020-07-22: 1 mg via INTRAVENOUS
  Filled 2020-07-22: qty 1

## 2020-07-22 NOTE — ED Provider Notes (Signed)
[facility]-EMERGENCY DEPT Provider Note   CSN: [id number] Arrival date & time: 07/22/20  1706     History Chief Complaint  Patient presents with  . Headache  . Emesis    Stephanie [name] is a 54 y.o. female.  She has a history of migraine headaches and started with a headache yesterday 9 out of 10 associated with vomiting.  She started with some substernal chest pain today along with some upper abdominal pain.  No blood in the vomitus no fever no numbness no weakness no blurry vision or double vision.  She also noticed her blood pressure was quite high.  The history is provided by the patient.  Headache Pain location:  Generalized Radiates to:  Does not radiate Severity currently:  8/10 Severity at highest:  8/10 Onset quality:  Gradual Duration:  24 hours Timing:  Constant Progression:  Unchanged Chronicity:  Recurrent Similar to prior headaches: yes   Relieved by:  Nothing Worsened by:  Nothing Ineffective treatments:  None tried Associated symptoms: abdominal pain, nausea and vomiting   Associated symptoms: no blurred vision, no fever, no focal weakness, no neck pain and no sore throat   Emesis Associated symptoms: abdominal pain and headaches   Associated symptoms: no fever and no sore throat        Past Medical History:  Diagnosis Date  . Diabetes mellitus without complication (HCC)   . Difficult intubation   . Diverticulitis   . Headache   . Hepatitis C    treated and resolved  . History of kidney stones   . Hypertension   . IBS (irritable bowel syndrome)   . Kidney stones   . Sciatica   . Sleep apnea    on cpap  . Stroke [facility](HCC) 2017   memory loss    Patient Active Problem List   Diagnosis Date Noted  . Hypotension 08/22/2018  . Bradycardia 08/22/2018  . Type 2 diabetes mellitus (HCC) 08/22/2018  . Acute encephalopathy 08/21/2018  . Shoulder pain, right 12/22/2017  . UTI (urinary tract infection) 08/21/2017  . Nausea &  vomiting 08/21/2017  . Nicotine dependence 08/21/2017  . Headache 01/07/2017  . Small bowel obstruction (HCC) 09/02/2016  . Pain and swelling of left upper extremity 07/22/2016  . Superficial venous thrombosis of arm, left 07/22/2016  . ICH (intracerebral hemorrhage) (HCC)   . Essential hypertension, malignant 04/19/2016  . Cytotoxic brain edema (HCC) 04/19/2016  . IVH (intraventricular hemorrhage) (HCC) 04/18/2016  . Hepatitis C     Past Surgical History:  Procedure Laterality Date  . ABDOMINAL HYSTERECTOMY    . carpel tunnel syndrome  2017  . HARDWARE REMOVAL Right 12/22/2017   Procedure: HARDWARE REMOVAL-RIGHT ARM;  Surgeon: Lyndle HerrlichBowers, James R, MD;  Location: [facility] ORS;  Service: Orthopedics;  Laterality: Right;  Removal of implant, superficial right humerus  . HUMERUS IM NAIL Right 03/11/2017   Procedure: INTRAMEDULLARY (IM) NAIL HUMERAL;  Surgeon: Lyndle HerrlichBowers, James R, MD;  Location: [facility] ORS;  Service: Orthopedics;  Laterality: Right;  . KIDNEY STONE SURGERY Right 02/12/12  . KIDNEY STONE SURGERY Left 07/15/2012  . LIGATION OF ARTERIOVENOUS  FISTULA Left 06/21/2016   Procedure: LIGATION OF ARTERIOVENOUS  FISTULA ( LIGATION BASILIC VEIN );  Surgeon: Renford DillsGregory G Schnier, MD;  Location: [facility] ORS;  Service: Vascular;  Laterality: Left;  . LITHOTRIPSY    . OOPHORECTOMY    . SINUS EXPLORATION    . TONSILLECTOMY       OB History   No  obstetric history on file.     Family History  Problem Relation Age of Onset  . Heart failure Mother   . Diabetes Father   . Cancer Sister     Social History   Tobacco Use  . Smoking status: Current Every Day Smoker    Packs/day: 1.00    Years: 1.50    Pack years: 1.50    Types: Cigarettes  . Smokeless tobacco: Never Used  Vaping Use  . Vaping Use: Never used  Substance Use Topics  . Alcohol use: Not Currently    Comment: no alcohol since 2002  . Drug use: No    Comment: electric cig    Home Medications Prior to Admission medications     Medication Sig Start Date End Date Taking? Authorizing Provider  alprazolam Prudy Feeler) 2 MG tablet Take 2 mg by mouth 2 (two) times daily as needed for anxiety. 08/04/19   [provider]  budesonide-formoterol (SYMBICORT) 160-4.5 MCG/ACT inhaler Inhale 2 puffs into the lungs 2 (two) times daily.    [provider]  Cetirizine HCl (ZYRTEC PO) Take by mouth.    [provider]  chlorthalidone (HYGROTON) 25 MG tablet Take 25 mg by mouth daily. 07/30/19   [provider]  cholecalciferol (VITAMIN D3) 25 MCG (1000 UT) tablet Take 1,000 Units by mouth daily.    [provider]  cloNIDine (CATAPRES) 0.2 MG tablet Take 0.2 mg by mouth 2 (two) times daily. 10/02/18   [provider]  clopidogrel (PLAVIX) 75 MG tablet Take 1 tablet (75 mg total) by mouth daily. 06/27/16   Marvel Plan, MD  dexamethasone (DECADRON) 2 MG tablet Take 1 tablet (2 mg total) by mouth 2 (two) times daily with a meal. 02/18/20   York Spaniel, MD  etodolac (LODINE) 300 MG capsule Take 1 capsule (300 mg total) by mouth 3 (three) times daily as needed (pain). 01/16/19   Linwood Dibbles, MD  FLUoxetine (PROZAC) 40 MG capsule Take 40 mg by mouth daily.  06/28/18   [provider]  Fremanezumab-vfrm (AJOVY) 225 MG/1.5ML SOAJ Inject 225 mg into the skin every 30 (thirty) days. 07/20/20   Butch Penny, NP  glipiZIDE (GLUCOTROL) 5 MG tablet Take 1 tablet (5 mg total) by mouth 2 (two) times daily before a meal. 04/30/16   Lora Paula, MD  hydrALAZINE (APRESOLINE) 100 MG tablet Take 100 mg by mouth 3 (three) times daily. 07/30/19   [provider]  ibuprofen (ADVIL) 200 MG tablet Take 800 mg by mouth every 6 (six) hours as needed for headache.    [provider]  lamoTRIgine (LAMICTAL) 100 MG tablet Take 1 tablet (100 mg total) by mouth 2 (two) times daily. 06/20/20   York Spaniel, MD  levETIRAcetam (KEPPRA) 500 MG tablet Take 1 tablet (500 mg total) by  mouth 2 (two) times daily. 06/20/20   York Spaniel, MD  losartan (COZAAR) 100 MG tablet Take 100 mg by mouth daily. 10/02/18   [provider]  omeprazole (PRILOSEC) 40 MG capsule Take 40 mg by mouth daily as needed for heartburn. 08/13/19   [provider]  pantoprazole (PROTONIX) 40 MG tablet Take 40 mg by mouth daily.    [provider]  promethazine (PHENERGAN) 25 MG tablet Take 1 tablet (25 mg total) by mouth every 6 (six) hours as needed for nausea or vomiting. 04/21/19   Horton, Mayer Masker, MD  Rimegepant Sulfate (NURTEC) 75 MG TBDP Take 1 tablet daily onset  of migraine. 07/20/20   Butch Penny, NP  traZODone (DESYREL) 100 MG tablet Take 100 mg by mouth at bedtime.    [provider]    Allergies    Gabapentin, Ibuprofen, Tylenol [acetaminophen], Aspirin, Buprenorphine hcl, Carbamazepine, Codeine, Compazine, Ioxaglate, Ivp dye [iodinated diagnostic agents], Metrizamide, Naproxen, Norco [hydrocodone-acetaminophen], Other, Penicillin g, Prochlorperazine maleate, Reglan [metoclopramide], Sulfur, Toradol [ketorolac tromethamine], Tramadol, and Zofran [ondansetron hcl]  Review of Systems   Review of Systems  Constitutional: Negative for fever.  HENT: Negative for sore throat.   Eyes: Negative for blurred vision and visual disturbance.  Respiratory: Negative for shortness of breath.   Cardiovascular: Positive for chest pain.  Gastrointestinal: Positive for abdominal pain, nausea and vomiting.  Genitourinary: Negative for dysuria.  Musculoskeletal: Negative for neck pain.  Skin: Negative for rash.  Neurological: Positive for headaches. Negative for focal weakness.    Physical Exam Updated Vital Signs BP (!) 171/91   Pulse 75   Temp 98.8 F (37.1 C) (Oral)   Resp 19   Ht 5\' 3"  (1.6 m)   SpO2 99%   BMI 32.59 kg/m   Physical Exam Vitals and nursing note reviewed.  Constitutional:      General: She is not in acute distress.    Appearance:  She is well-developed.  HENT:     Head: Normocephalic and atraumatic.  Eyes:     Extraocular Movements: Extraocular movements intact.     Conjunctiva/sclera: Conjunctivae normal.     Pupils: Pupils are equal, round, and reactive to light.  Cardiovascular:     Rate and Rhythm: Normal rate and regular rhythm.     Heart sounds: No murmur heard.   Pulmonary:     Effort: Pulmonary effort is normal. No respiratory distress.     Breath sounds: Normal breath sounds.  Abdominal:     Palpations: Abdomen is soft.     Tenderness: There is no abdominal tenderness.  Musculoskeletal:        General: Normal range of motion.     Cervical back: Neck supple.  Skin:    General: Skin is warm and dry.  Neurological:     Mental Status: She is alert.     GCS: GCS eye subscore is 4. GCS verbal subscore is 5. GCS motor subscore is 6.     Cranial Nerves: No cranial nerve deficit, dysarthria or facial asymmetry.     Sensory: No sensory deficit.     Motor: No weakness.     ED Results / Procedures / Treatments   Labs (all labs ordered are listed, but only abnormal results are displayed) Labs Reviewed  CBC - Abnormal; Notable for the following components:      Result Value   RDW 18.6 (*)    All other components within normal limits  I-STAT BETA HCG BLOOD, ED ([facility], [facility], [facility] ONLY) - Abnormal; Notable for the following components:   I-stat hCG, quantitative 5.3 (*)    All other components within normal limits  CBG MONITORING, ED - Abnormal; Notable for the following components:   Glucose-Capillary 105 (*)    All other components within normal limits  BASIC METABOLIC PANEL  TROPONIN I (HIGH SENSITIVITY)  TROPONIN I (HIGH SENSITIVITY)    EKG EKG Interpretation  Date/Time:  Saturday July 22 2020 17:33:23 EST Ventricular Rate:  70 PR Interval:    QRS Duration: 82 QT Interval:  381 QTC Calculation: 412 R Axis:   7 Text Interpretation: Sinus rhythm Borderline prolonged PR interval Low voltage,  precordial leads Consider anterior infarct No significant change since prior 8/21 Confirmed by Meridee Score [phone]) on 07/22/2020 6:29:18 PM   Radiology DG Chest 2 View  Result Date: 07/22/2020 CLINICAL DATA:  54 year old female with chest pain. EXAM: CHEST - 2 VIEW COMPARISON:  Chest radiograph dated 05/11/2020. FINDINGS: There is chronic interstitial coarsening and bronchitic changes. No focal consolidation, pleural effusion, pneumothorax. Stable cardiac silhouette. Atherosclerotic calcification of the aorta. Degenerative changes of the spine. No acute osseous pathology. Recording device over the left chest wall. IMPRESSION: No active cardiopulmonary disease. Electronically Signed   By: Elgie Collard M.D.   On: 07/22/2020 18:16    Procedures Procedures (including critical care time)  Medications Ordered in ED Medications  HYDROmorphone (DILAUDID) injection 1 mg (1 mg Intravenous Given 07/22/20 1856)  HYDROmorphone (DILAUDID) injection 1 mg (1 mg Intravenous Given 07/22/20 1933)    ED Course  I have reviewed the triage vital signs and the nursing notes.  Pertinent labs & imaging results that were available during my care of the patient were reviewed by me and considered in my medical decision making (see chart for details).  Clinical Course as of Jul 23 1012  Sat Jul 22, 2020  1829 Chest x-ray interpreted by me as no acute infiltrates.   [MB]  1940 Reassessed patient she said her headache is down to a 6.  Blood pressure also improved.  Delta troponin negative.  Will dose with another dose of pain medicine and hopefully should be able to be discharged soon.   [MB]  2010 Reassessed patient.  She says her headaches 1001 now and she is asking for something to drink.  She said she will call her husband for a ride.   [MB]    Clinical Course User Index [MB] Terrilee Files, MD   MDM Rules/Calculators/A&P                         This patient complains of migraine headache,  nausea and vomiting, chest pain, upper abdominal pain; this involves an extensive number of treatment Options and is a complaint that carries with it a high risk of complications and Morbidity. The differential includes migraine headache, ACS, GERD, gastritis, peptic ulcer disease, cholelithiasis, musculoskeletal  I ordered, reviewed and interpreted labs, which included CBC with normal white count normal hemoglobin, chemistries normal, troponin flat I ordered medication IV pain medicine as patient has allergies to topical migraine cocktail medications I ordered imaging studies which included chest x-ray and I independently    visualized and interpreted imaging which showed no acute pulmonary disease Previous records obtained and reviewed in epic, patient has frequent visits to the ED for migraines along with other painful conditions  After the interventions stated above, I reevaluated the patient and found patient's pain to be better controlled.  She said she would like to be discharged so she can go home and sleep and the headache will usually be better in the morning.  She is getting a ride from her husband.  Return instructions discussed.   Final Clinical Impression(s) / ED Diagnoses Final diagnoses:  Migraine without status migrainosus, not intractable, unspecified migraine type  Nonspecific chest pain  Primary hypertension    Rx / DC Orders ED Discharge Orders    None       Terrilee Files, MD 07/23/20 1016

## 2020-07-22 NOTE — Discharge Instructions (Signed)
You were seen in the emergency department for evaluation of headache elevated blood pressure chest pain abdominal pain nausea and vomiting.  You had lab work that did not show any serious findings.  Your symptoms improved with some medication.  Please follow-up with your regular doctors.  Return to the emergency department for any worsening or concerning symptoms.

## 2020-07-22 NOTE — ED Triage Notes (Signed)
Patient reports headache and vomiting starting yesterday. Pain rated 9/10, states she went to cardiologist yesterday and a heart monitor was put on for her to wear for 14 days. Patient takes blood pressure medication and took them today.

## 2020-07-24 ENCOUNTER — Telehealth: Payer: Self-pay | Admitting: Neurology

## 2020-07-24 ENCOUNTER — Telehealth: Payer: Self-pay | Admitting: Adult Health

## 2020-07-24 NOTE — Telephone Encounter (Signed)
Received PA request for Ajovy. PA was started on [email or web address]. Key is [id number]. Will check on status of PA in 2-3 business days.

## 2020-07-25 IMAGING — CT CT HEAD W/O CM
3 series · 15 of 47 positions shown, 18 images · non-contrast
Comparison: May 06, 2018

CLINICAL DATA: Slurred speech.  Stroke in 2549.  Fall this morning.

EXAM:
CT HEAD WITHOUT CONTRAST
TECHNIQUE: Contiguous axial images were obtained from the base of the skull
through the vertex without intravenous contrast.

[Series 2: head wo · axial · 0.47mm/px · z∈[-171,-46]mm · 9 of 31 slices shown, 12 images]
[im 3/31  brain]
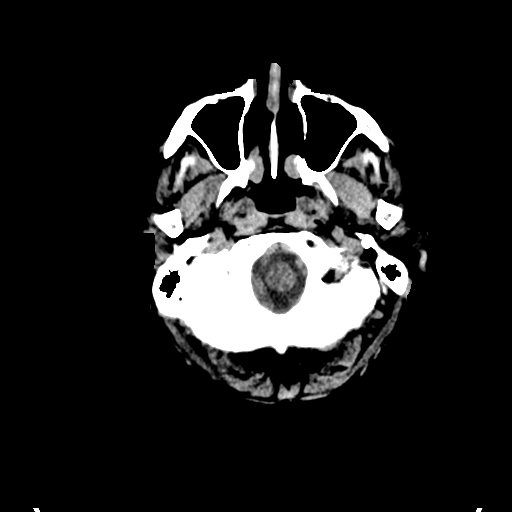
[im 3/31  bone]
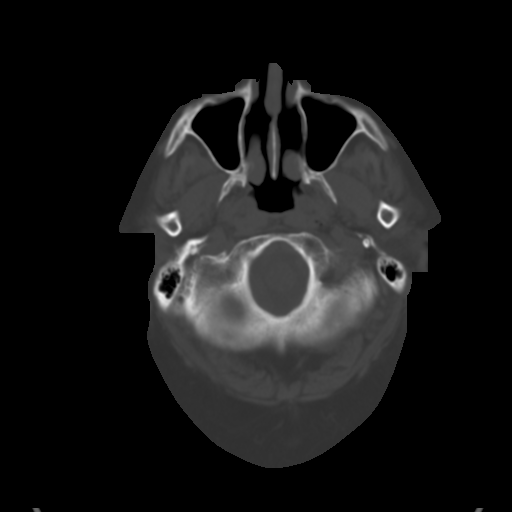
[im 6/31  brain]
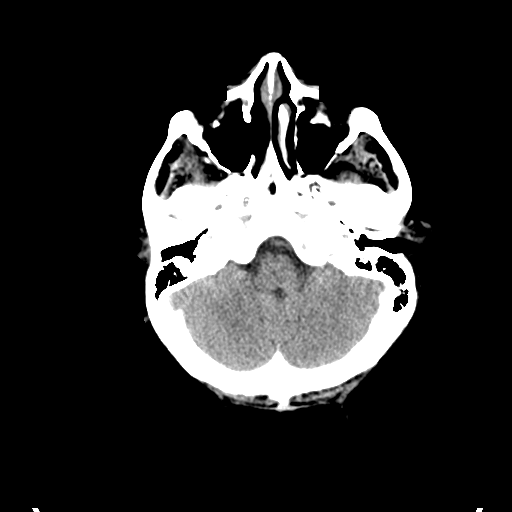
[im 9/31  brain]
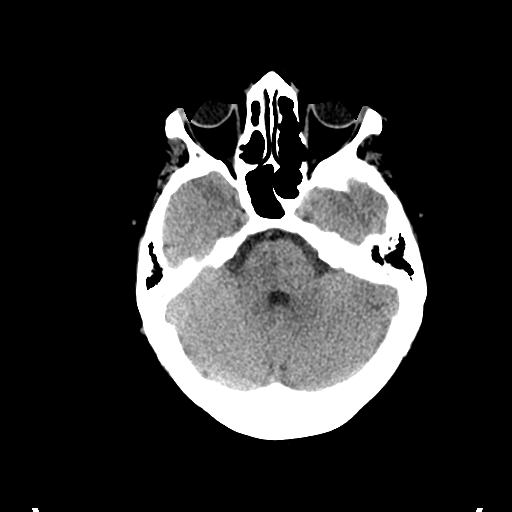
[im 12/31  brain]
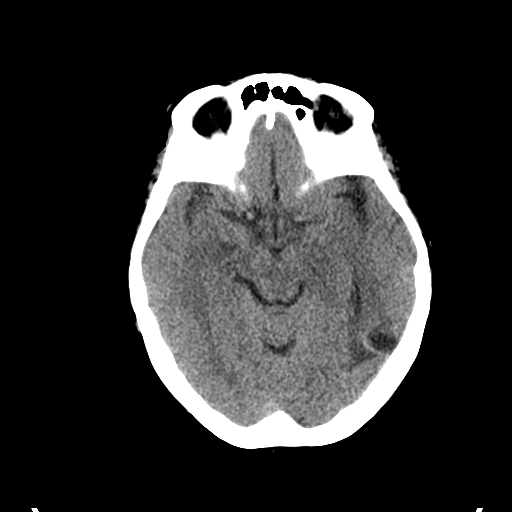
[im 16/31  brain]
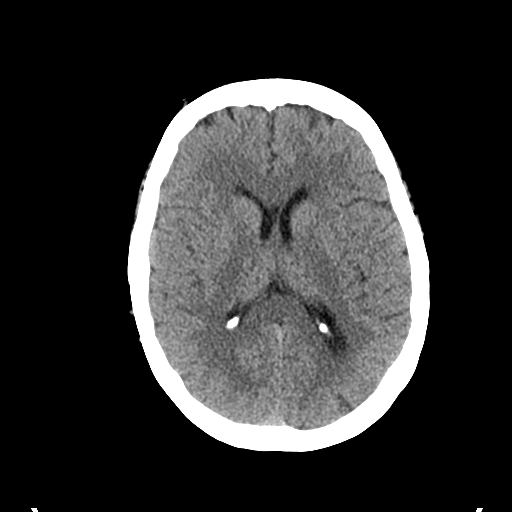
[im 16/31  bone]
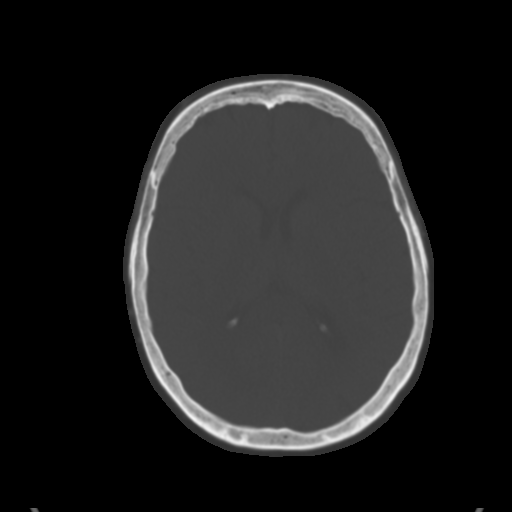
[im 19/31  brain]
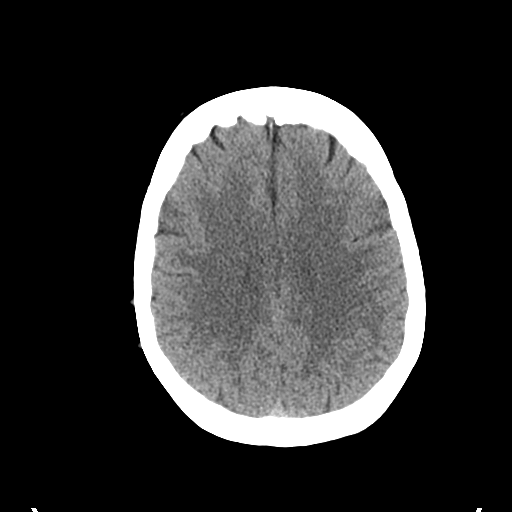
[im 22/31  brain]
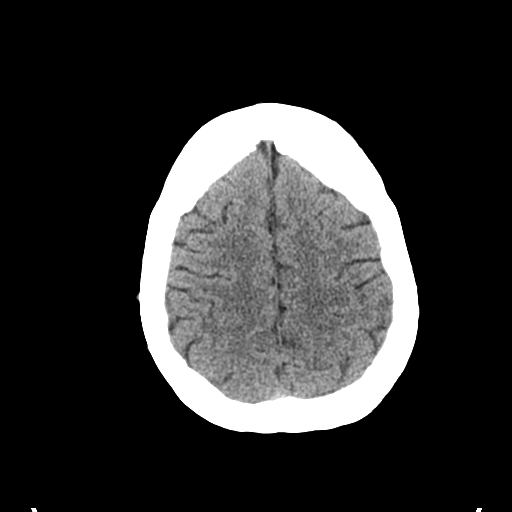
[im 25/31  brain]
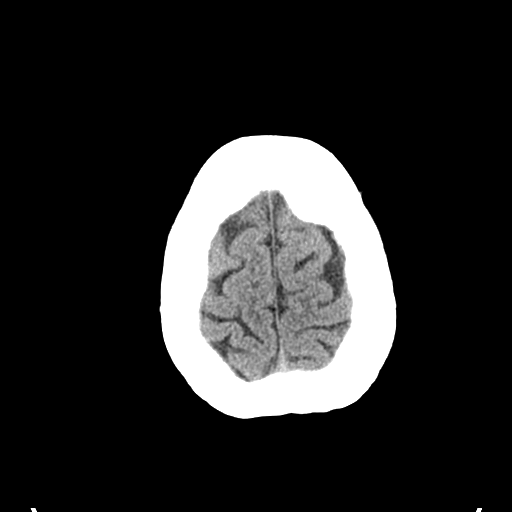
[im 28/31  brain]
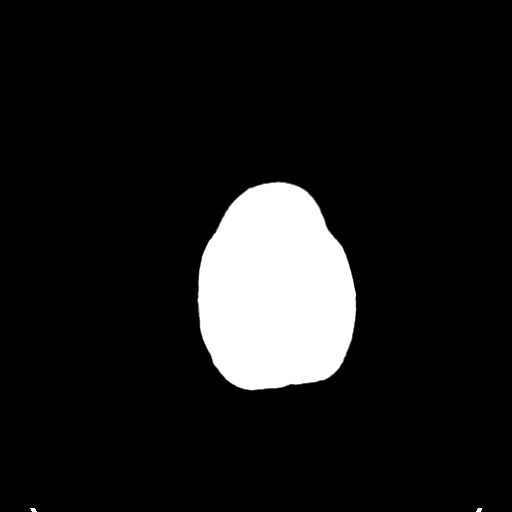
[im 28/31  bone]
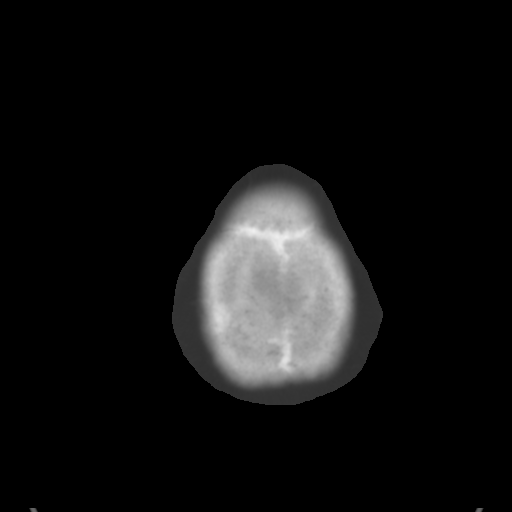

[Series 4: coronal soft tissue · coronal · 0.30mm/px · 3 of 63 slices shown]
[im 21/63  brain]
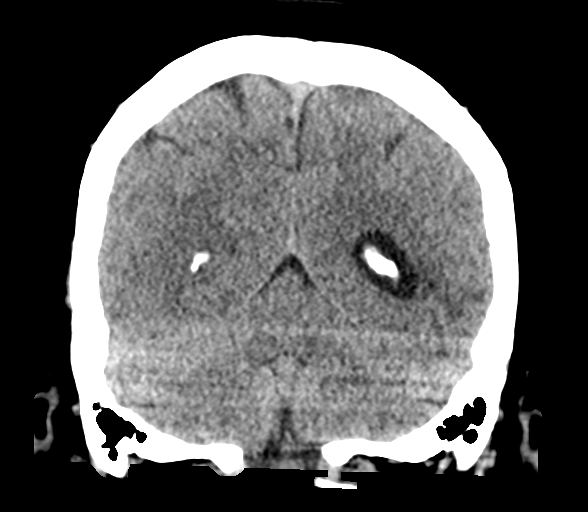
[im 28/63  brain]
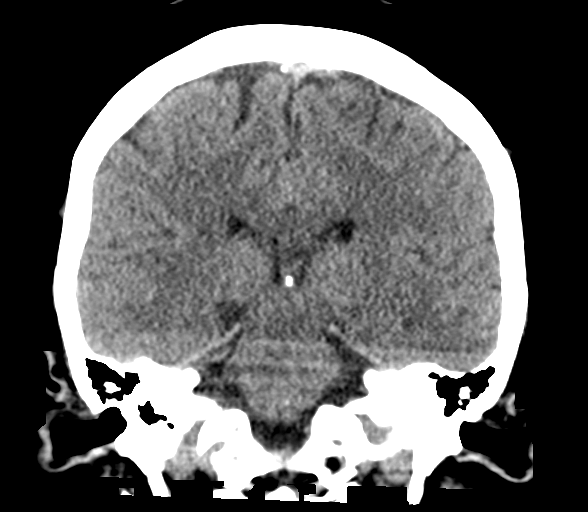
[im 35/63  brain]
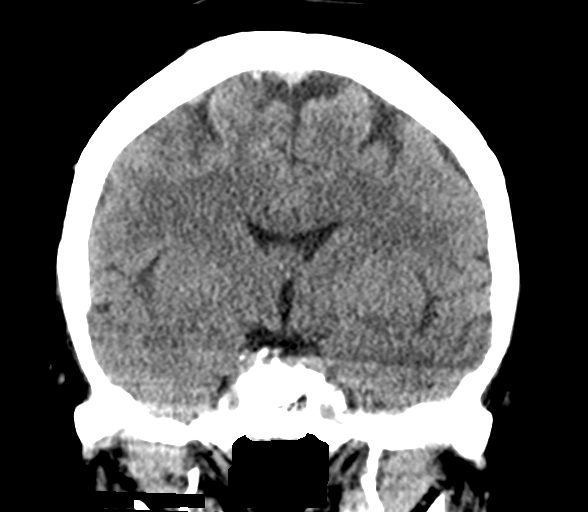

[Series 5: sagittal soft tissue · sagittal · 0.30mm/px · 3 of 53 slices shown]
[im 18/53  brain]
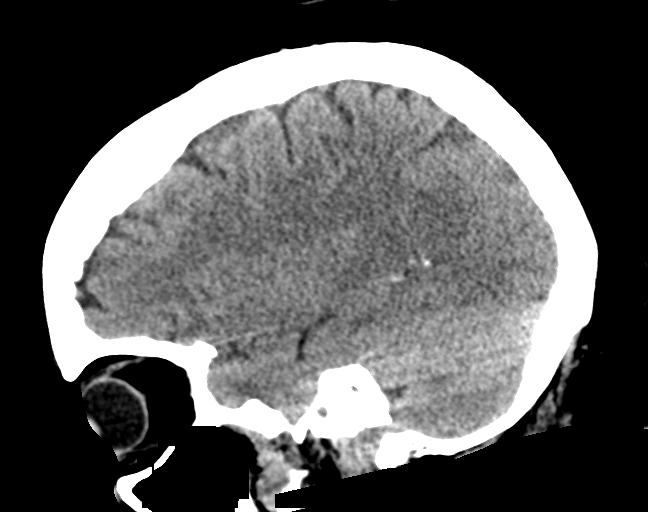
[im 27/53  brain]
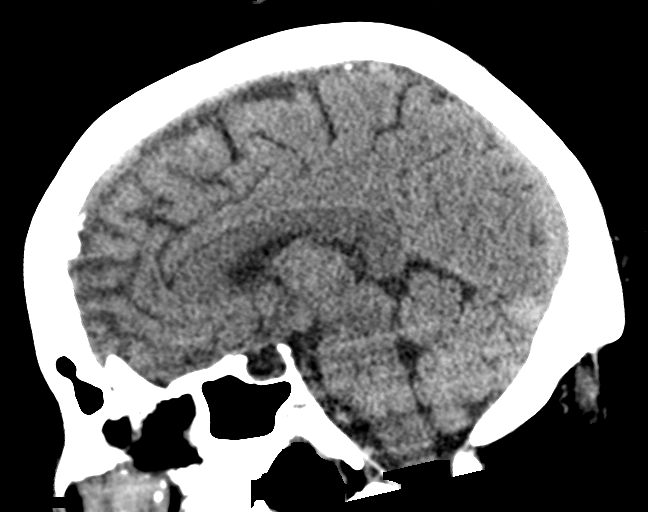
[im 35/53  brain]
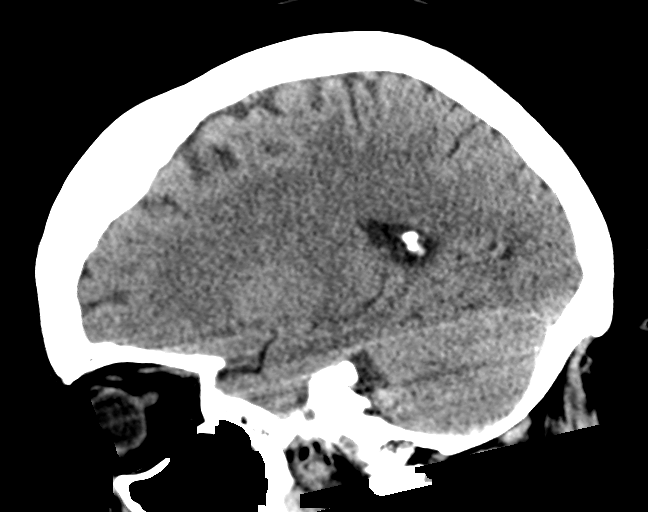

[15 of 47 positions shown; findings below may reference images not displayed]

FINDINGS: Brain: No subdural, epidural, or subarachnoid hemorrhage. Left
posterior temporal infarct, stable with ex vacuo dilatation of the
adjacent left lateral ventricle. No acute cortical ischemia or
infarct. Ventricles and sulci are stable. No mass effect or midline
shift. Cerebellum, brainstem, and basal cisterns are normal.

Vascular: No hyperdense vessel or unexpected calcification.

Skull: Normal. Negative for fracture or focal lesion.

Sinuses/Orbits: Mucous retention cyst in the left maxillary sinus
and mucosal thickening in the ethmoid air cells.

Other: None.
IMPRESSION: 1. Remote left posterior temporal infarct. No acute abnormalities
are identified.

## 2020-07-26 ENCOUNTER — Telehealth: Payer: Self-pay | Admitting: Adult Health

## 2020-07-26 NOTE — Telephone Encounter (Signed)
Pt has called to report she has had a migraine since Sunday, pt even went to ED and was given medication which pt states elevated her blood pressure.  Pt is asking for a call as to what can be done while waiting to be seen

## 2020-07-26 NOTE — Telephone Encounter (Signed)
I called pt she is having migraine, went to Ed 07-22-20 Bp elevated has seen cardiology and he has changed her meds to losartan 100mg  po daily and bp still elevated at 175-/119 has thallium scan stress test 07-31-20.  She had dilaudid x 2 at the Ed helped migraine, no Bp.  She has had aura for last 2 days R side of head /ear sore (eye/eardrum).  Due ajovy 08-04-20 PA still pending.  Will check on nurtec, ubrelvy 100mg  dose as not helped and she does not have any.  She will go to ED if get worse.  I will f/u with her tomorrow.  MM/NP gone I believe but will send to her and see if any other recommendations.

## 2020-07-27 NOTE — Telephone Encounter (Signed)
I spoke to MM/NP.  She stated that narcotics we do not prescribe for migraines.  (pt not sure what she had previously started with C or percocet type drug). ??  I relayed this to pt LMVM,  And another option was steroids, but due to her BP and pulse being elevated,  that not good option at this time.  ED was only alternative at this time.  She will call back as needed.

## 2020-07-27 NOTE — Telephone Encounter (Signed)
If headache does not improve she will need to go back to ED. Hopefully when she starts Ajovy headache frequency and severity will improve.

## 2020-07-27 NOTE — Telephone Encounter (Signed)
Pt did not pick up LMVM.

## 2020-07-27 NOTE — Telephone Encounter (Signed)
PA for Ajovy has been approved. Effective dates are 07/27/20-10/27/20. Will fax a copy of the determination to patient's pharmacy.

## 2020-07-27 NOTE — Telephone Encounter (Signed)
I called pt and she states not better, nurtec is pending for approval, ajovy approved.  She states about the same and  level 8.  If not better will go to ED.  She keeps talking about medication that helped previously starts with c.  Like codiene, percocet.  I relayed that we do not precribe that for pts.  Will see if any more reccs?

## 2020-07-27 NOTE — Telephone Encounter (Signed)
Noted. Please check on patient and see if headache has improved

## 2020-07-28 IMAGING — CT CT ABD-PELV W/O CM
2 of 4 series · 15 of 46 positions shown, 17 images · non-contrast
Comparison: CT scan of June 25, 2018.

CLINICAL DATA: Acute right-sided abdominal pain.

EXAM:
CT ABDOMEN AND PELVIS WITHOUT CONTRAST
TECHNIQUE: Multidetector CT imaging of the abdomen and pelvis was performed
following the standard protocol without IV contrast.

[Series 4: thins · axial · 0.72mm/px · z∈[-903,-466]mm · 12 of 1013 slices shown, 14 images]
[im 70/1013  soft-tissue]
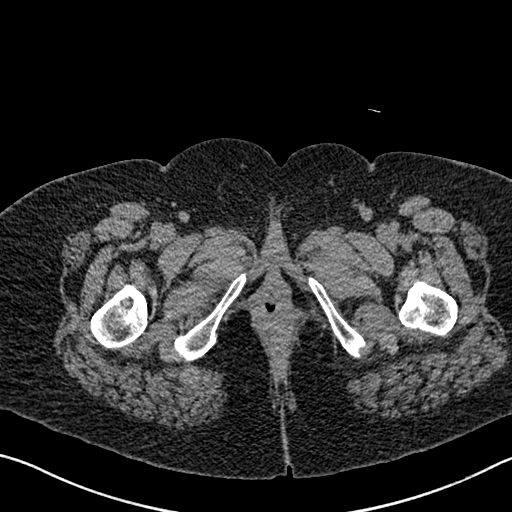
[im 70/1013  bone]
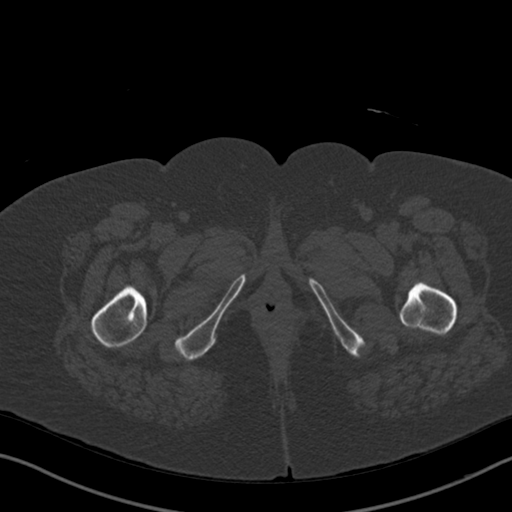
[im 140/1013  soft-tissue]
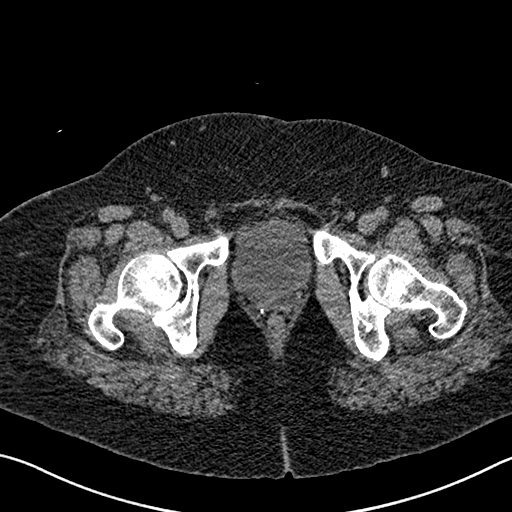
[im 210/1013  soft-tissue]
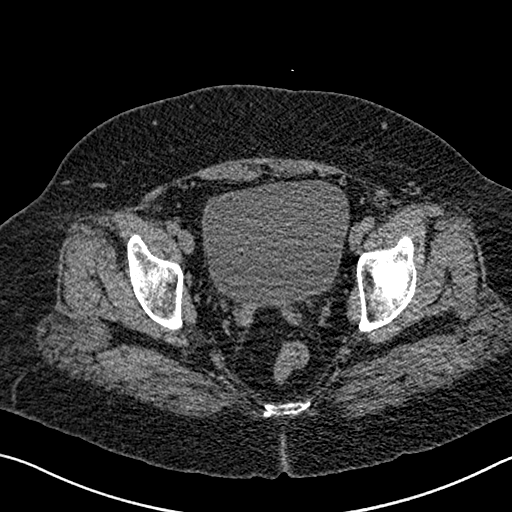
[im 315/1013  soft-tissue]
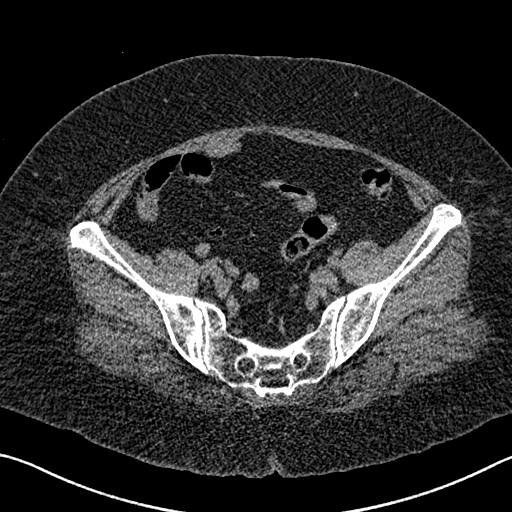
[im 384/1013  soft-tissue]
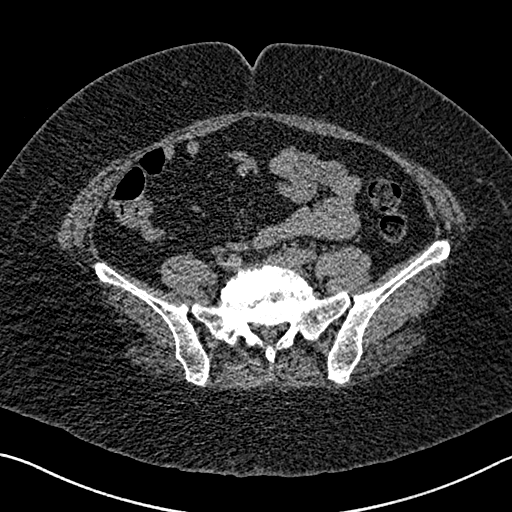
[im 454/1013  soft-tissue]
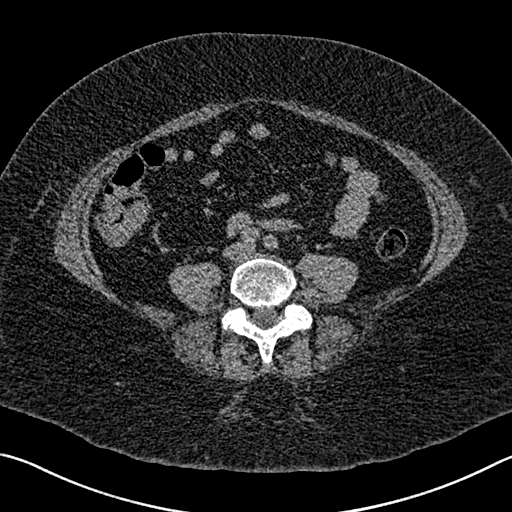
[im 559/1013  soft-tissue]
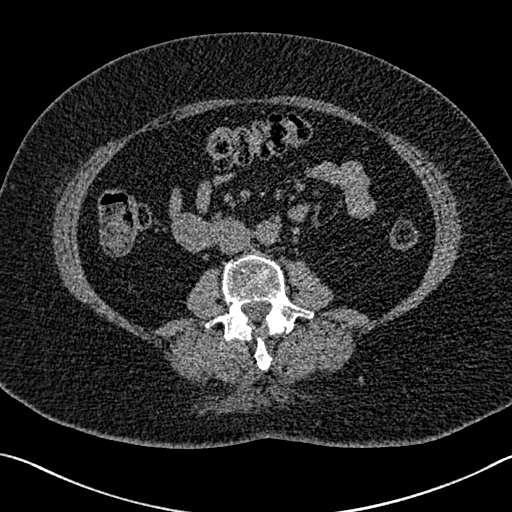
[im 629/1013  soft-tissue]
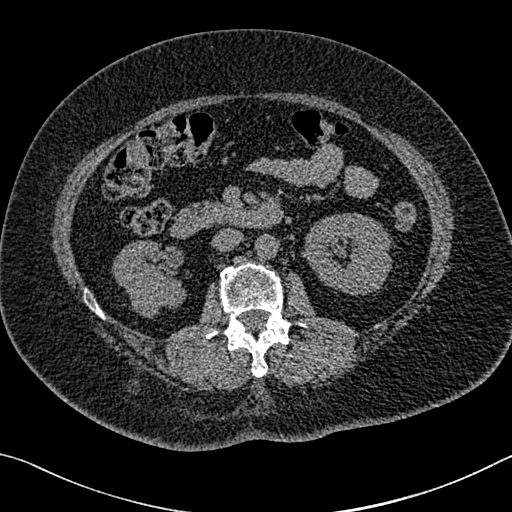
[im 698/1013  soft-tissue]
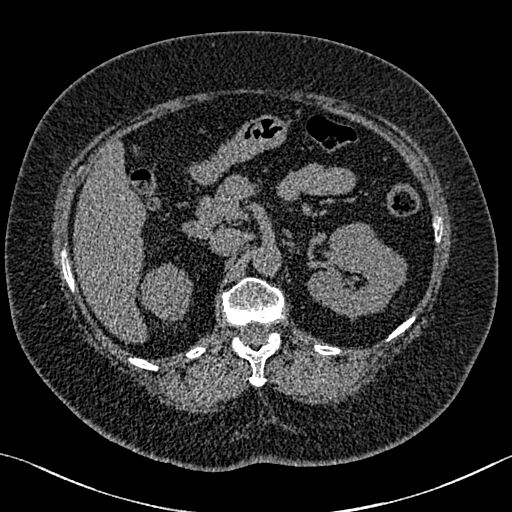
[im 698/1013  bone]
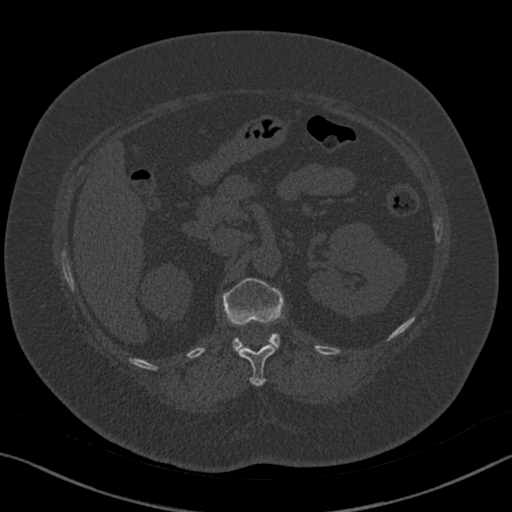
[im 803/1013  soft-tissue]
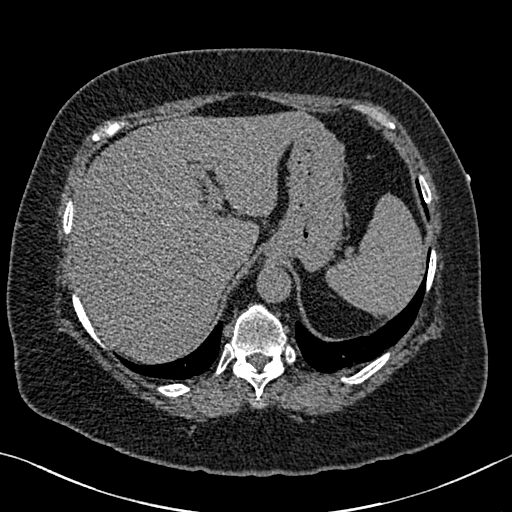
[im 873/1013  soft-tissue]
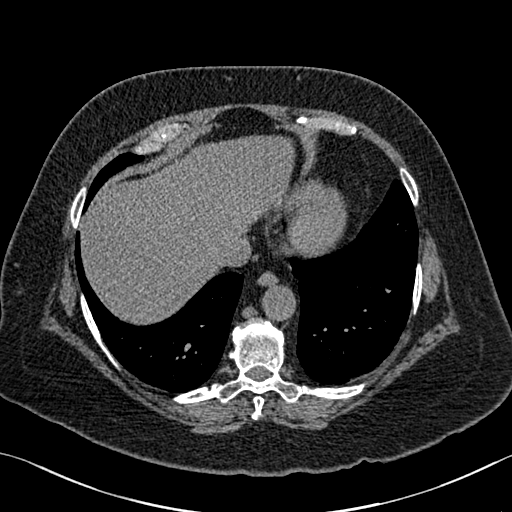
[im 943/1013  soft-tissue]
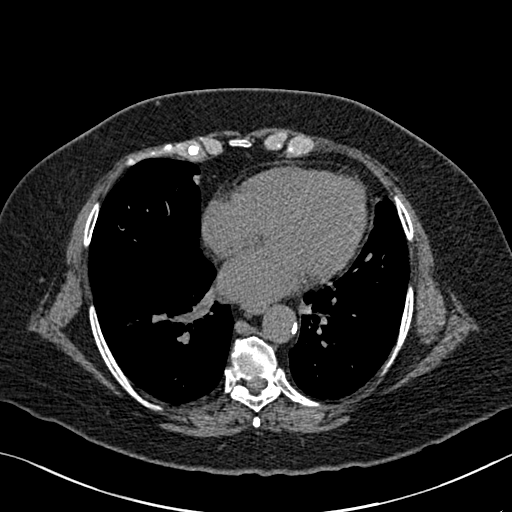

[Series 10: coronal st · coronal · 0.63mm/px · 3 of 95 slices shown]
[im 32/95  soft-tissue]
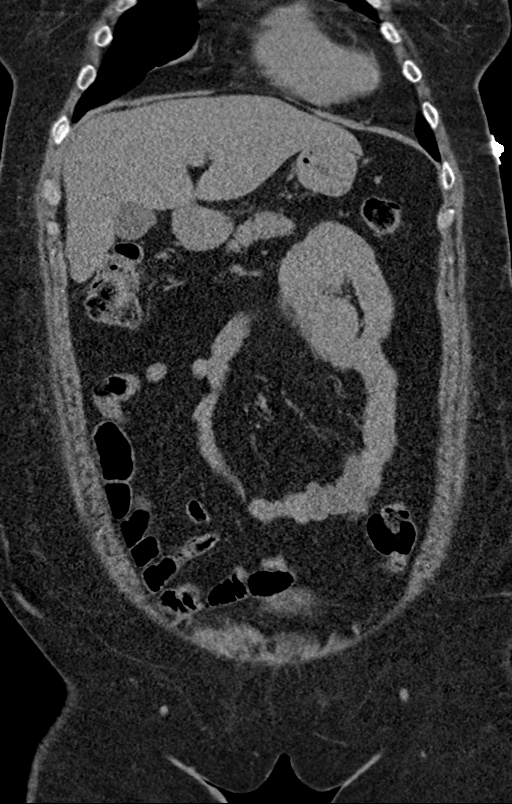
[im 42/95  soft-tissue]
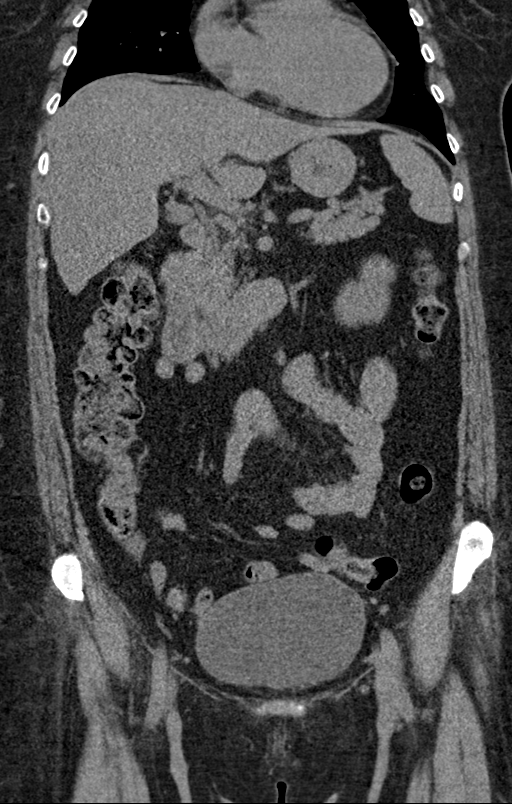
[im 53/95  soft-tissue]
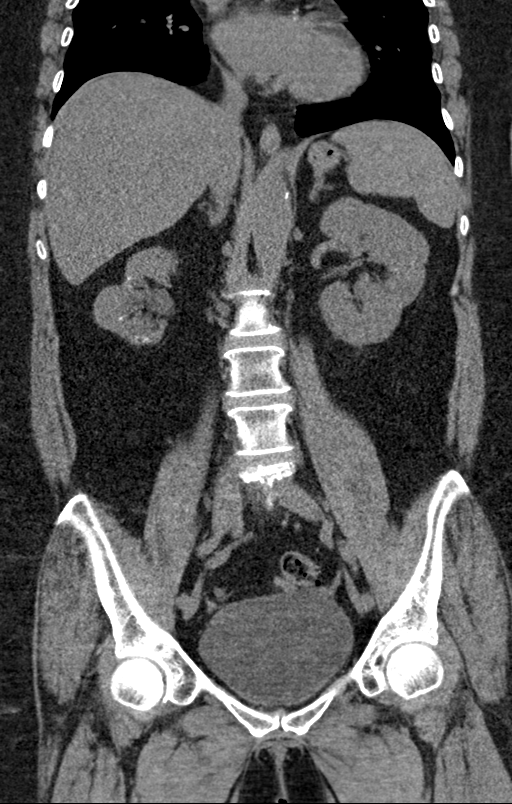

[15 of 46 positions shown; findings below may reference images not displayed]

FINDINGS: Lower chest: No acute abnormality.

Hepatobiliary: No focal liver abnormality is seen. No gallstones,
gallbladder wall thickening, or biliary dilatation.

Pancreas: Unremarkable. No pancreatic ductal dilatation or
surrounding inflammatory changes.

Spleen: Normal in size without focal abnormality.

Adrenals/Urinary Tract: Adrenal glands appear normal. Right renal
atrophy and cortical scarring is noted. Nonobstructive right
nephrolithiasis is noted. No hydronephrosis or renal obstruction is
noted. Urinary bladder is unremarkable.

Stomach/Bowel: Stomach is within normal limits. Appendix appears
normal. No evidence of bowel wall thickening, distention, or
inflammatory changes.

Vascular/Lymphatic: Aortic atherosclerosis. No enlarged abdominal or
pelvic lymph nodes.

Reproductive: Status post hysterectomy. No adnexal masses.

Other: No abdominal wall hernia or abnormality. No abdominopelvic
ascites.

Musculoskeletal: No acute or significant osseous findings.
IMPRESSION: Nonobstructive right nephrolithiasis. No hydronephrosis or renal
obstruction is noted. Right renal atrophy and cortical scarring is
noted.

Aortic Atherosclerosis ([id number]).

## 2020-07-28 IMAGING — CT CT HEAD W/O CM
3 series · 16 of 47 positions shown, 19 images · non-contrast
Comparison: 06/26/2018 and 10/13/2017

CLINICAL DATA: Minor head trauma.

EXAM:
CT HEAD WITHOUT CONTRAST
TECHNIQUE: Contiguous axial images were obtained from the base of the skull
through the vertex without intravenous contrast.

[Series 5: head wo · axial · 0.41mm/px · z∈[-203,-73]mm · 10 of 32 slices shown, 13 images]
[im 3/32  brain]
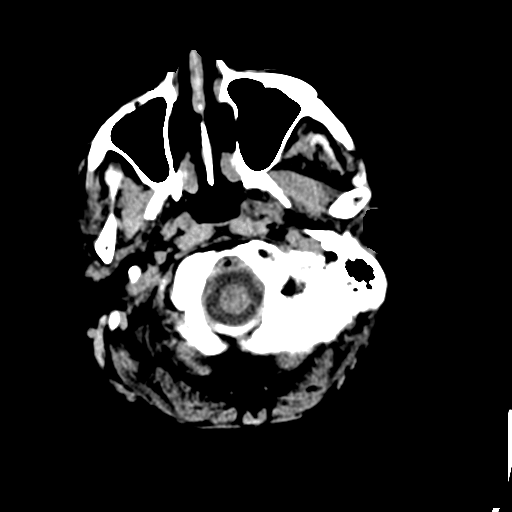
[im 3/32  bone]
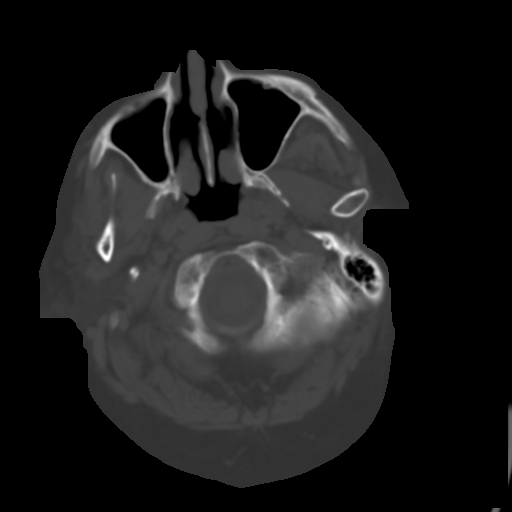
[im 6/32  brain]
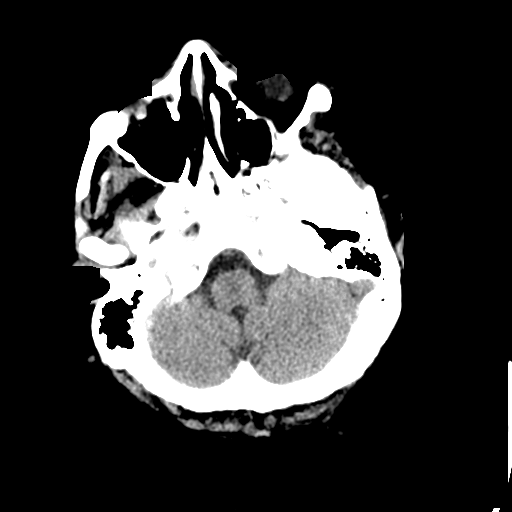
[im 9/32  brain]
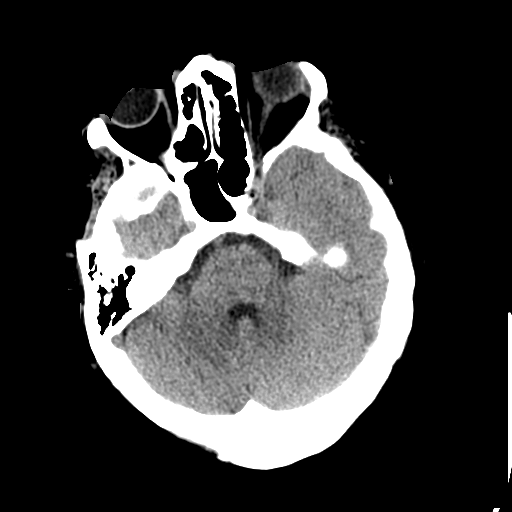
[im 11/32  brain]
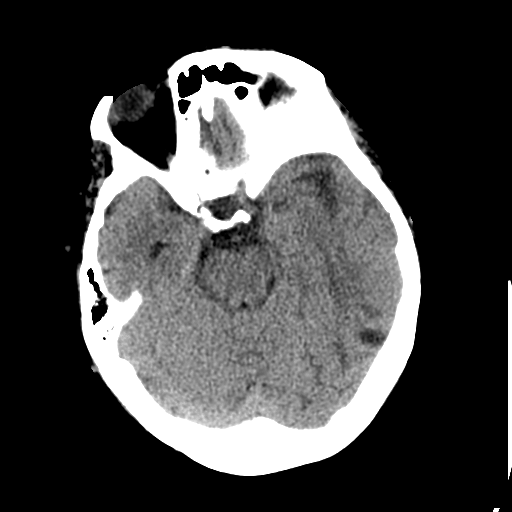
[im 14/32  brain]
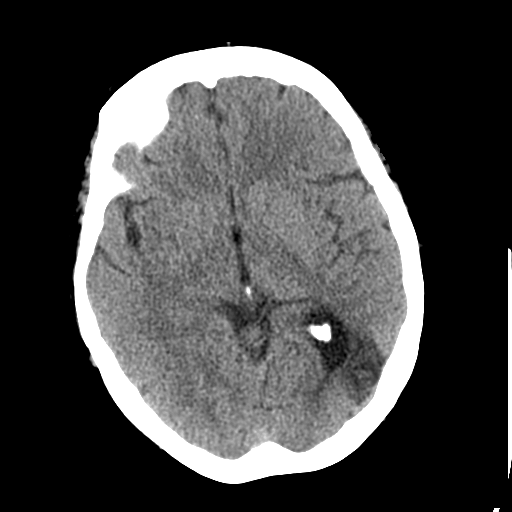
[im 14/32  bone]
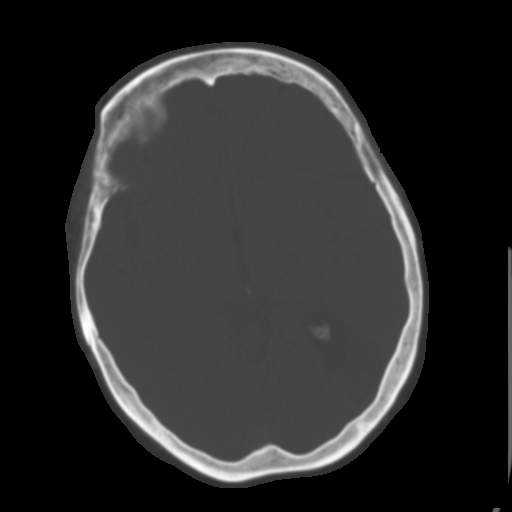
[im 18/32  brain]
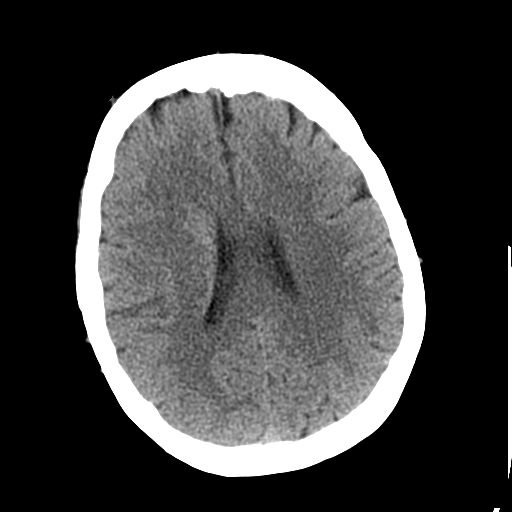
[im 21/32  brain]
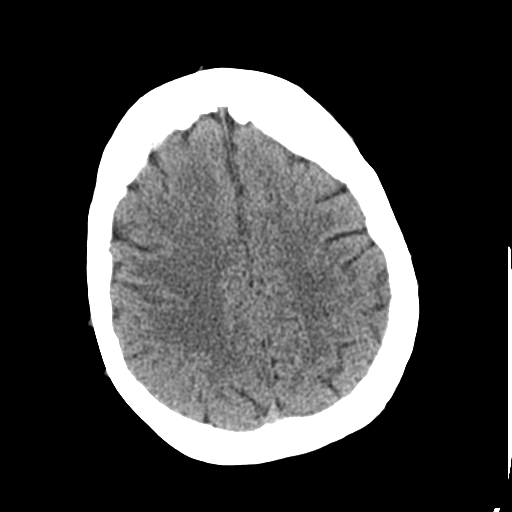
[im 24/32  brain]
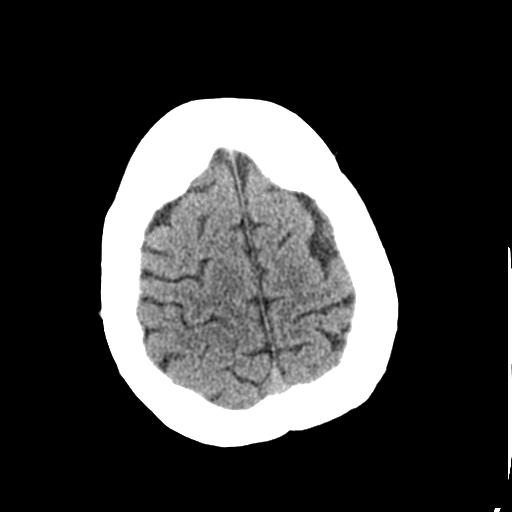
[im 26/32  brain]
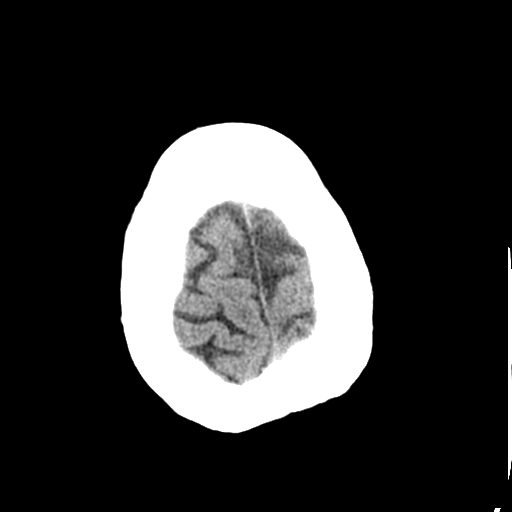
[im 26/32  bone]
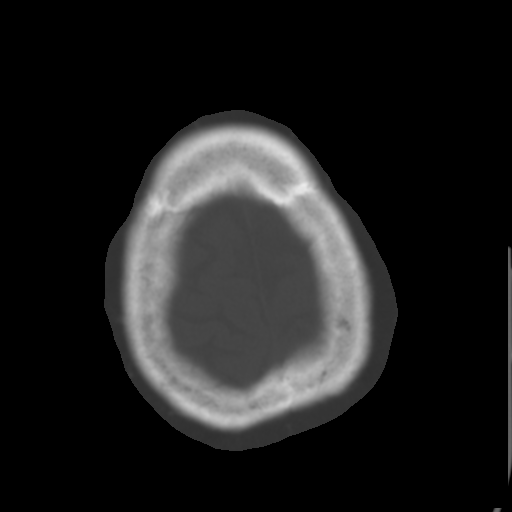
[im 29/32  brain]
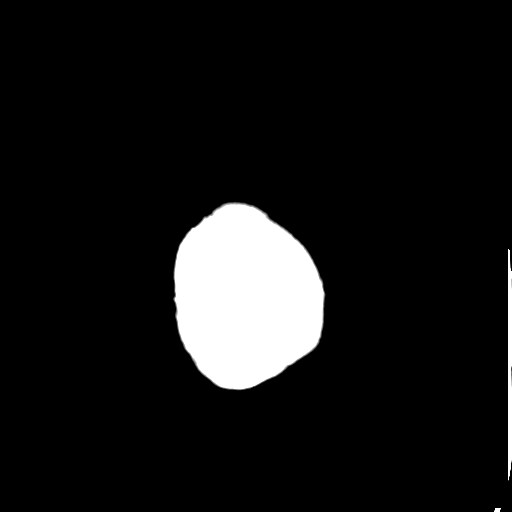

[Series 7: coronal soft tissue · coronal · 0.31mm/px · 3 of 69 slices shown]
[im 23/69  brain]
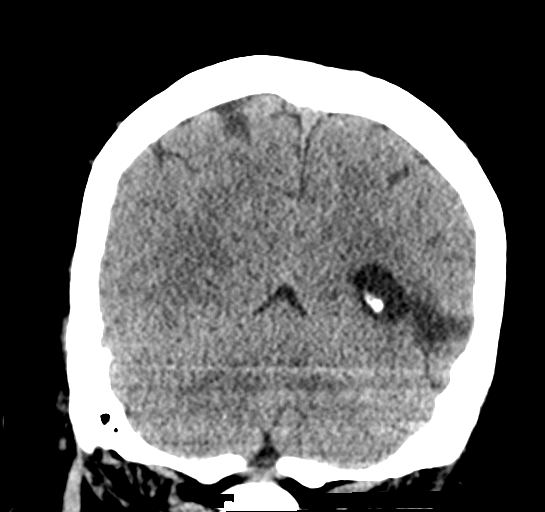
[im 31/69  brain]
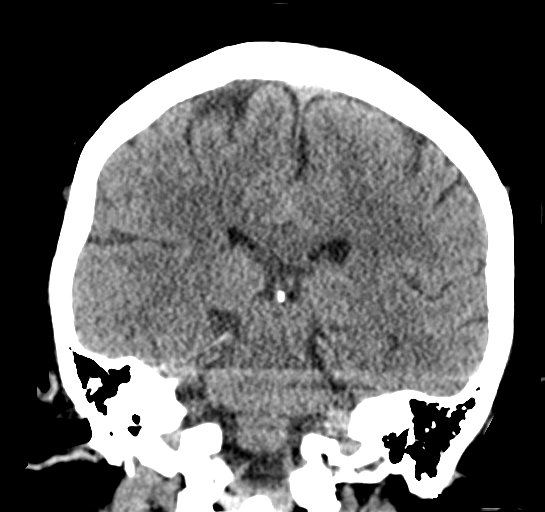
[im 38/69  brain]
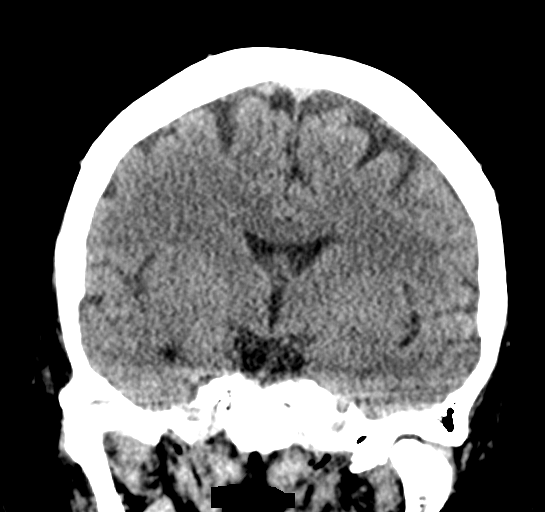

[Series 8: sagittal soft tissue · sagittal · 0.32mm/px · 3 of 61 slices shown]
[im 21/61  brain]
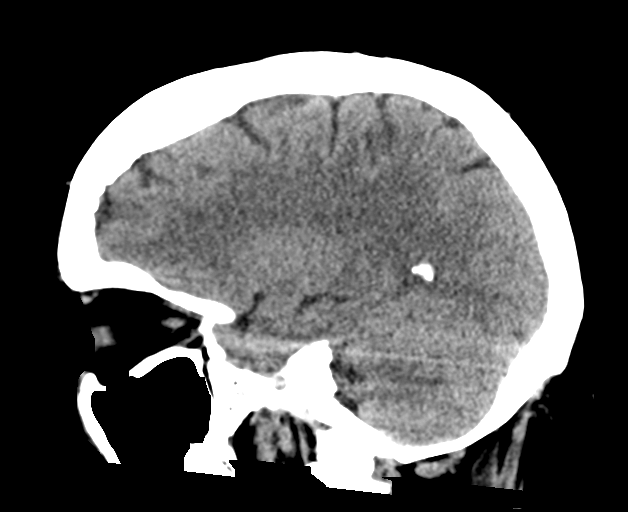
[im 31/61  brain]
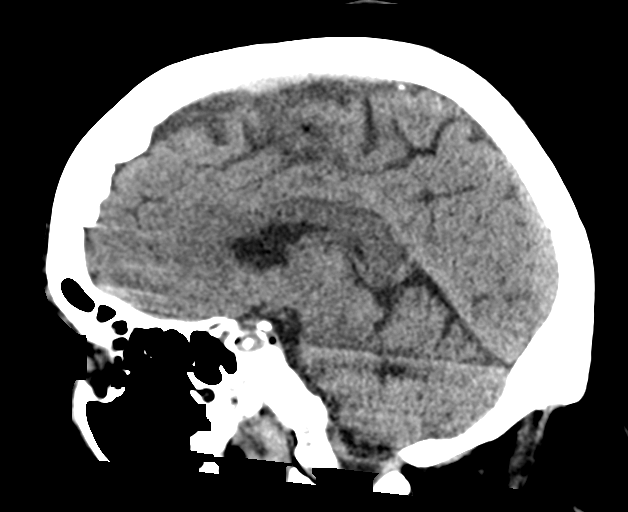
[im 41/61  brain]
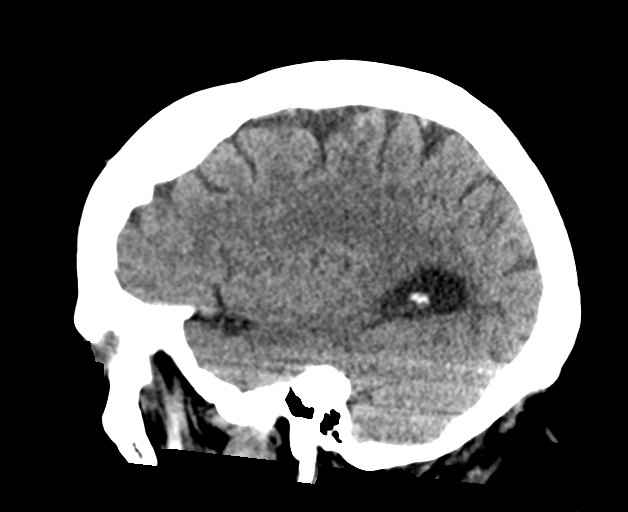

[16 of 47 positions shown; findings below may reference images not displayed]

FINDINGS: Brain: Ventricles, cisterns and other CSF spaces are within normal.
Old left posterior parietal/watershed infarct. No evidence of mass,
mass effect, shift of midline structures or acute hemorrhage. No
acute infarction.

Vascular: No hyperdense vessel or unexpected calcification.

Skull: Normal. Negative for fracture or focal lesion.

Sinuses/Orbits: No acute finding.

Other: None.
IMPRESSION: No acute findings.

Old left posterior parietal/watershed infarct.

## 2020-07-31 NOTE — Telephone Encounter (Signed)
Pt called, pharmacy said medicaid want to know why physician prescribed Nurtec and Imitrex for the same symptoms Medicaid asking for the physician to call them. I would like a call from the nurse.

## 2020-07-31 NOTE — Telephone Encounter (Signed)
I called the pharmacy ([facility]) at [facility].  They still waiting on nurtec for pt (pa pending).  I do not see that pt had been given sumatriptan before by [place].  Looks like 11/2018 there is noted prescription.

## 2020-08-01 NOTE — Telephone Encounter (Signed)
I called pharmacy and pt picked up emgality on Sunday 07-30-20 as the ajovy not approved?  So is not able to pick this up now until gets to be closer to 30 day.  Nurtec denied per [place] [employer] 07-27-20.

## 2020-08-01 NOTE — Telephone Encounter (Signed)
I called pt and her phone disconnected.

## 2020-08-02 NOTE — Addendum Note (Signed)
Addended by: Guy Begin on: 08/02/2020 09:09 AM   Modules accepted: Orders

## 2020-08-02 NOTE — Telephone Encounter (Signed)
LMVM for pt that ajovy was approved.  She should be able to get that next time injection is due. She is to call back if questions.

## 2020-08-02 NOTE — Telephone Encounter (Signed)
Spoke to pt yesterday afternoon.  She continued with migraine.  She took emgality 07-31-20, I relayed that AJOVY had been approved.  Refaxed to [facility] to let them know. (this had [place] [facility]- Margie Ege NP as the provider).  Pt had been to [facility] for migraine over the weekend.  She should be able to get ajovy next time.   Cancelled emgality in her med list.

## 2020-08-18 ENCOUNTER — Other Ambulatory Visit: Payer: Self-pay | Admitting: Nurse Practitioner

## 2020-08-18 DIAGNOSIS — Z1231 Encounter for screening mammogram for malignant neoplasm of breast: Secondary | ICD-10-CM

## 2020-08-21 ENCOUNTER — Telehealth: Payer: Self-pay | Admitting: Adult Health

## 2020-08-21 IMAGING — CT CT HEAD W/O CM
3 series · 16 of 47 positions shown, 19 images · non-contrast
Comparison: 06/28/2018

CLINICAL DATA: Headache, nausea, vomiting and dizziness x3 days.

EXAM:
CT HEAD WITHOUT CONTRAST
TECHNIQUE: Contiguous axial images were obtained from the base of the skull
through the vertex without intravenous contrast.

[Series 2: head wo · axial · 0.41mm/px · z∈[+1279,+1404]mm · 10 of 31 slices shown, 13 images]
[im 3/31  brain]
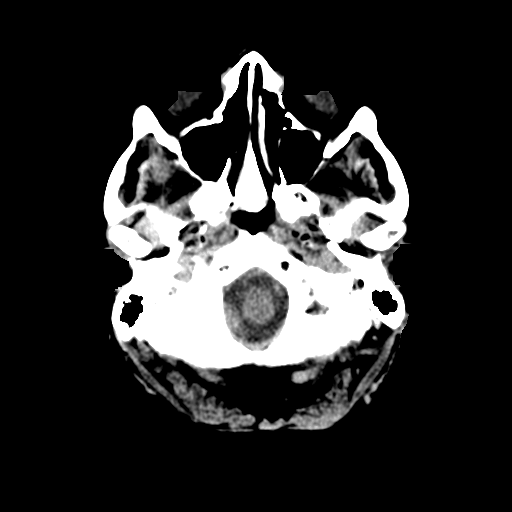
[im 3/31  bone]
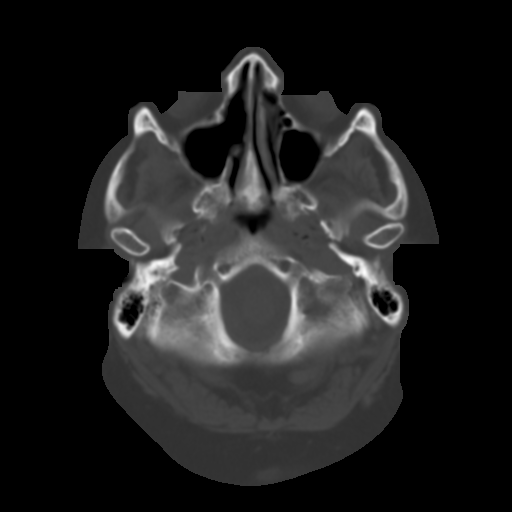
[im 6/31  brain]
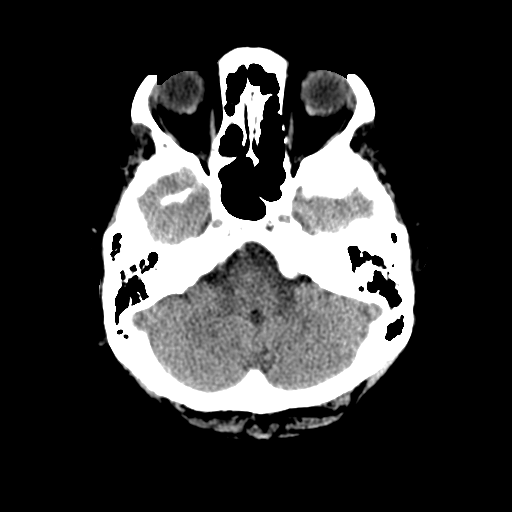
[im 9/31  brain]
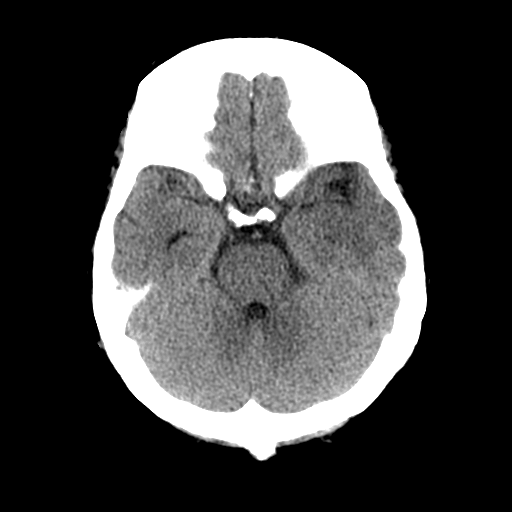
[im 11/31  brain]
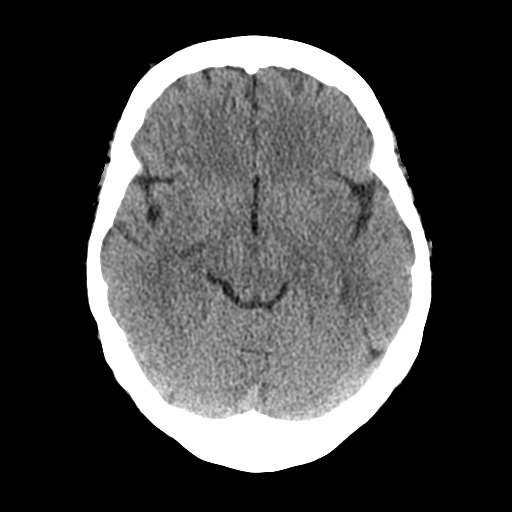
[im 14/31  brain]
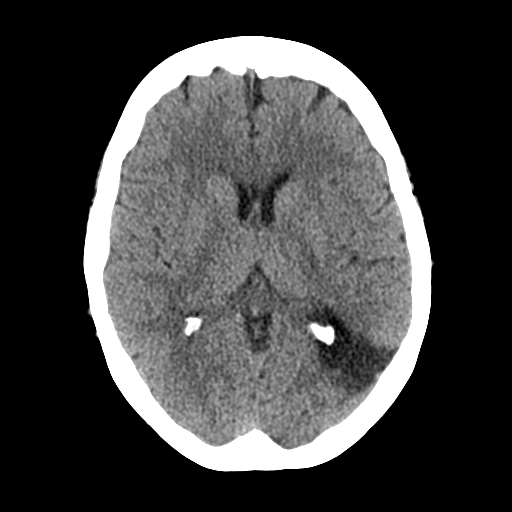
[im 14/31  bone]
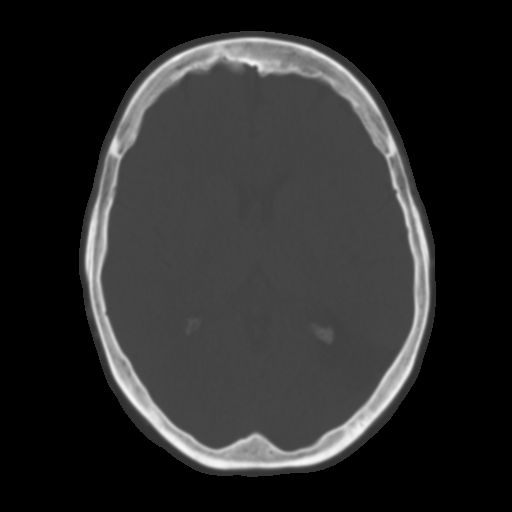
[im 17/31  brain]
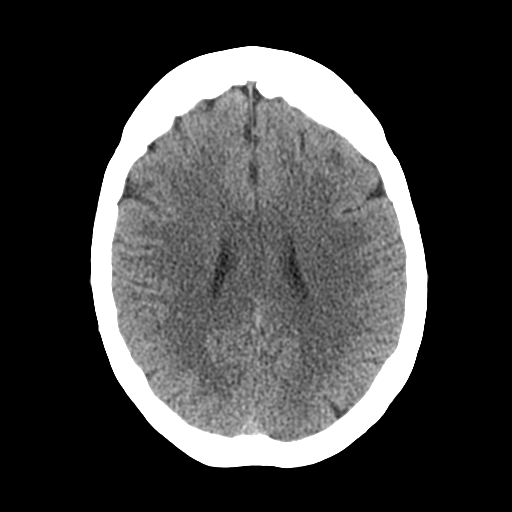
[im 20/31  brain]
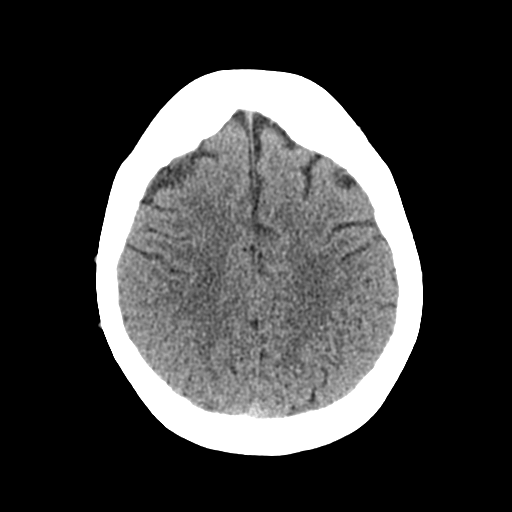
[im 23/31  brain]
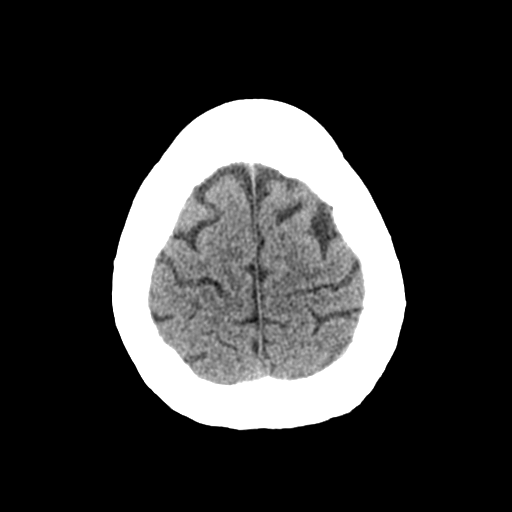
[im 25/31  brain]
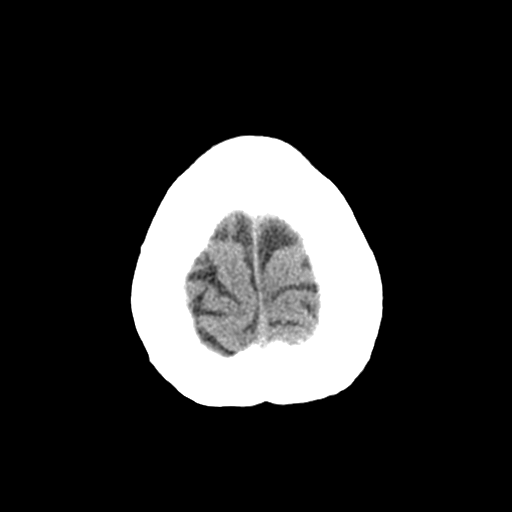
[im 25/31  bone]
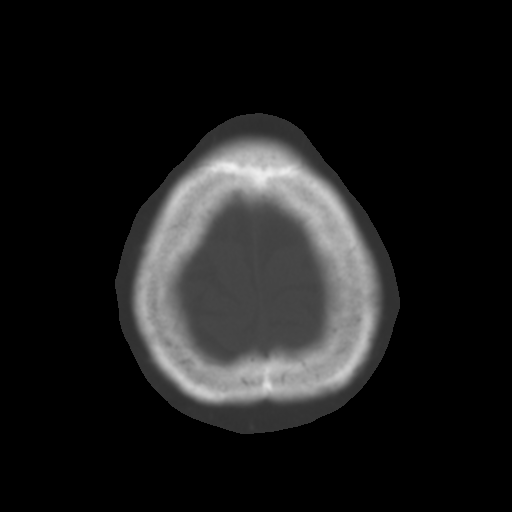
[im 28/31  brain]
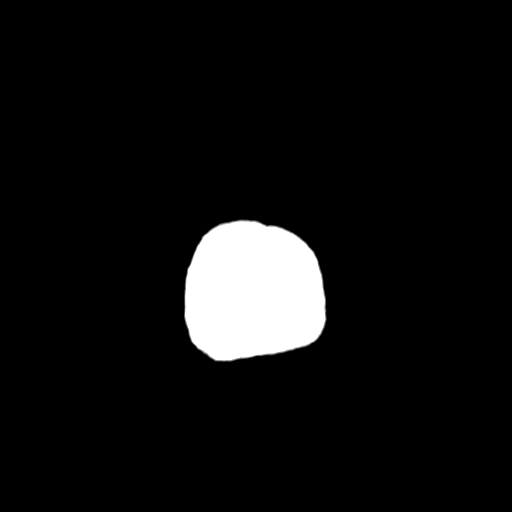

[Series 5: coronal soft tissue · coronal · 0.29mm/px · 3 of 66 slices shown]
[im 22/66  brain]
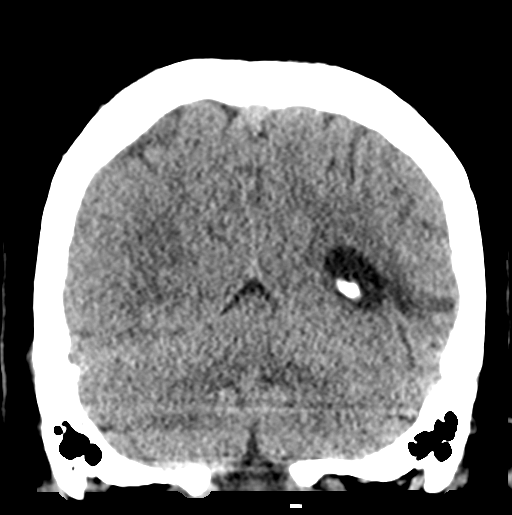
[im 29/66  brain]
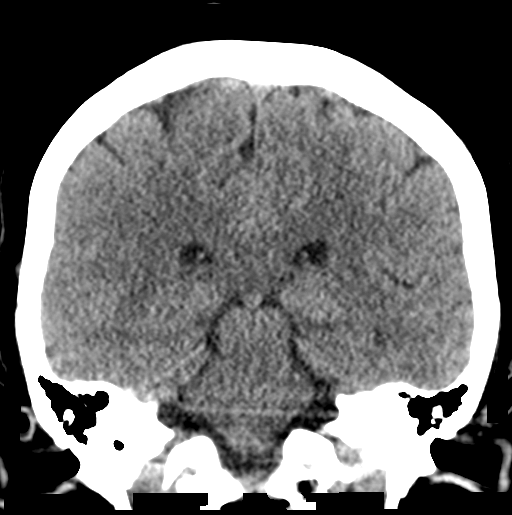
[im 37/66  brain]
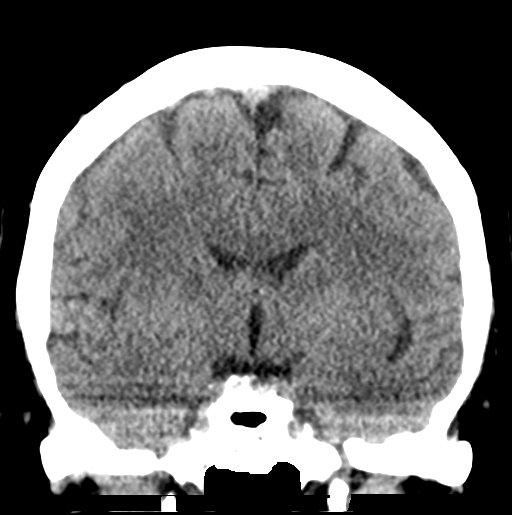

[Series 6: sagittal soft tissue · sagittal · 0.29mm/px · 3 of 51 slices shown]
[im 17/51  brain]
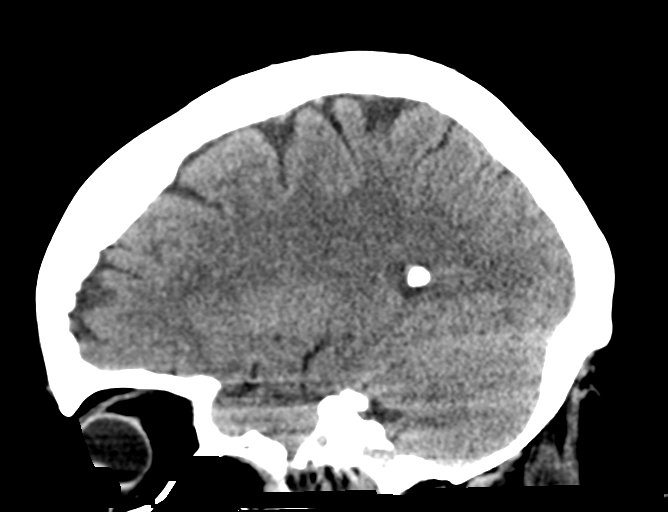
[im 26/51  brain]
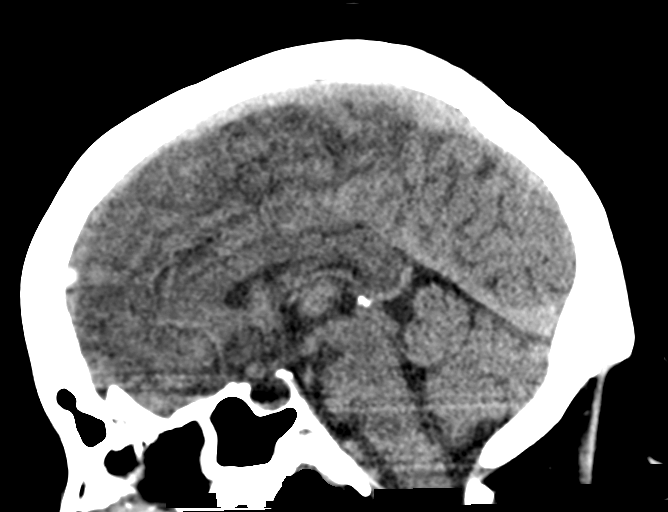
[im 34/51  brain]
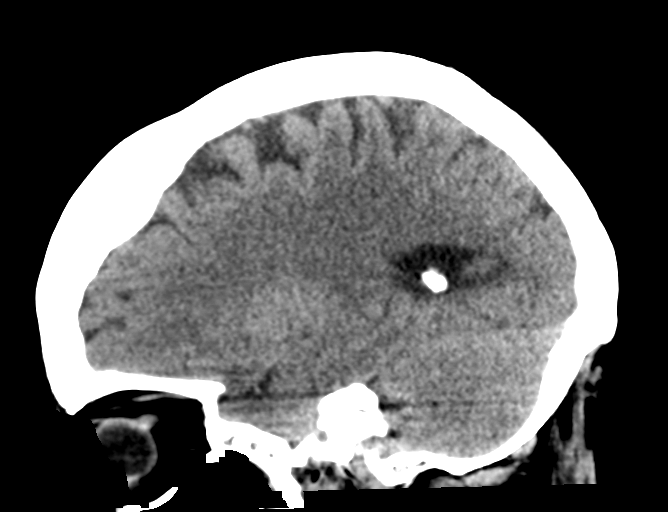

[16 of 47 positions shown; findings below may reference images not displayed]

FINDINGS: Brain: Redemonstration of remote left posterior parietal infarct
with encephalomalacia. No acute intracranial hemorrhage, acute
appearing large vascular territory infarct, intra-axial mass nor
extra-axial fluid. Midline fourth ventricle and basal cisterns
without effacement. The cerebellum and brainstem are nonacute in
appearance.

Vascular: No hyperdense vessel sign.

Skull: Intact

Sinuses/Orbits: Intact

Other: None
IMPRESSION: Remote left posterior parietal lobe infarct with encephalomalacia.
No acute intracranial abnormality.

## 2020-08-21 NOTE — Telephone Encounter (Signed)
Pt called, feet are hurting so bad I can hardly walk. Can physician prescribe something to help. Would like a call from the nurse.

## 2020-08-22 MED ORDER — LEVETIRACETAM 750 MG PO TABS: 750.0000 mg | ORAL_TABLET | Freq: Two times a day (BID) | ORAL | 3 refills | Status: DC

## 2020-08-22 NOTE — Telephone Encounter (Signed)
The patient called back about the Keppra, she indicates that she has not been on Keppra, this prescription was sent in on 20 June 2020 with 60 tablets and 3 refills.  The patient will call pharmacy regarding the prescription.  If she is not on it, she will need to restart it.

## 2020-08-22 NOTE — Telephone Encounter (Signed)
Called Stephanie [name] after speaking with MM/NP.  Stephanie [name] is stating that she had EMG/[place] bu Dr. Anne Hahn.  01-2020.  I read thew results other unremarkable.  She states this was for the toe pain, feels like frozen, Progressively gotten worse, very painful.   Per MM/NP see Dr. Anne Hahn, stated will forward to Dr. Anne Hahn / Alcario Drought for cancellation to see for neuropathy.  She states wanted Video visit and did not want another [place]/EMG.  I relayed that due to sx she may need to come in.  She verbalized understanding.   Transportation was an issue.  Stephanie [name] is taking Bernita Raisin, has had hx stroke, cannot take triptans (imitrex), also I relayed to her that Arnetha Massy was approved she is due 08-30-20, since last injection was emgality 07-31-20.  She will call pharmacy.  Nurtec PA was denied.

## 2020-08-22 NOTE — Telephone Encounter (Signed)
I called the patient.  She is having some pain in the feet with walking, but she does describe cold sensations in the feet.  She will go up on the Keppra taking 500 mg taking 1.5 tablets twice a day, I will call in the 750 mg tablets.  The patient will start taking capsicin cream applied to the feet 3 times daily.

## 2020-08-23 NOTE — Telephone Encounter (Signed)
Called and followed up with patient.  She picked up the Levitracetam at the pharmacy yesterday so she has all of her medicine.  She still has headache today, hurting the same as it always has.    Patient received her denial letter for nurtek but approved aimovig.  She stated she just took it last night.  She stated she would call us back in a week if she wasn't feeling any better.

## 2020-08-31 ENCOUNTER — Telehealth: Payer: Medicaid Other | Admitting: Adult Health

## 2020-09-20 IMAGING — DX DG CHEST 1V PORT
1 series · 1 of 1 positions shown · non-contrast
Comparison: Radiograph earlier this day

CLINICAL DATA: Intubation.

EXAM:
PORTABLE CHEST 1 VIEW

[chest ap]
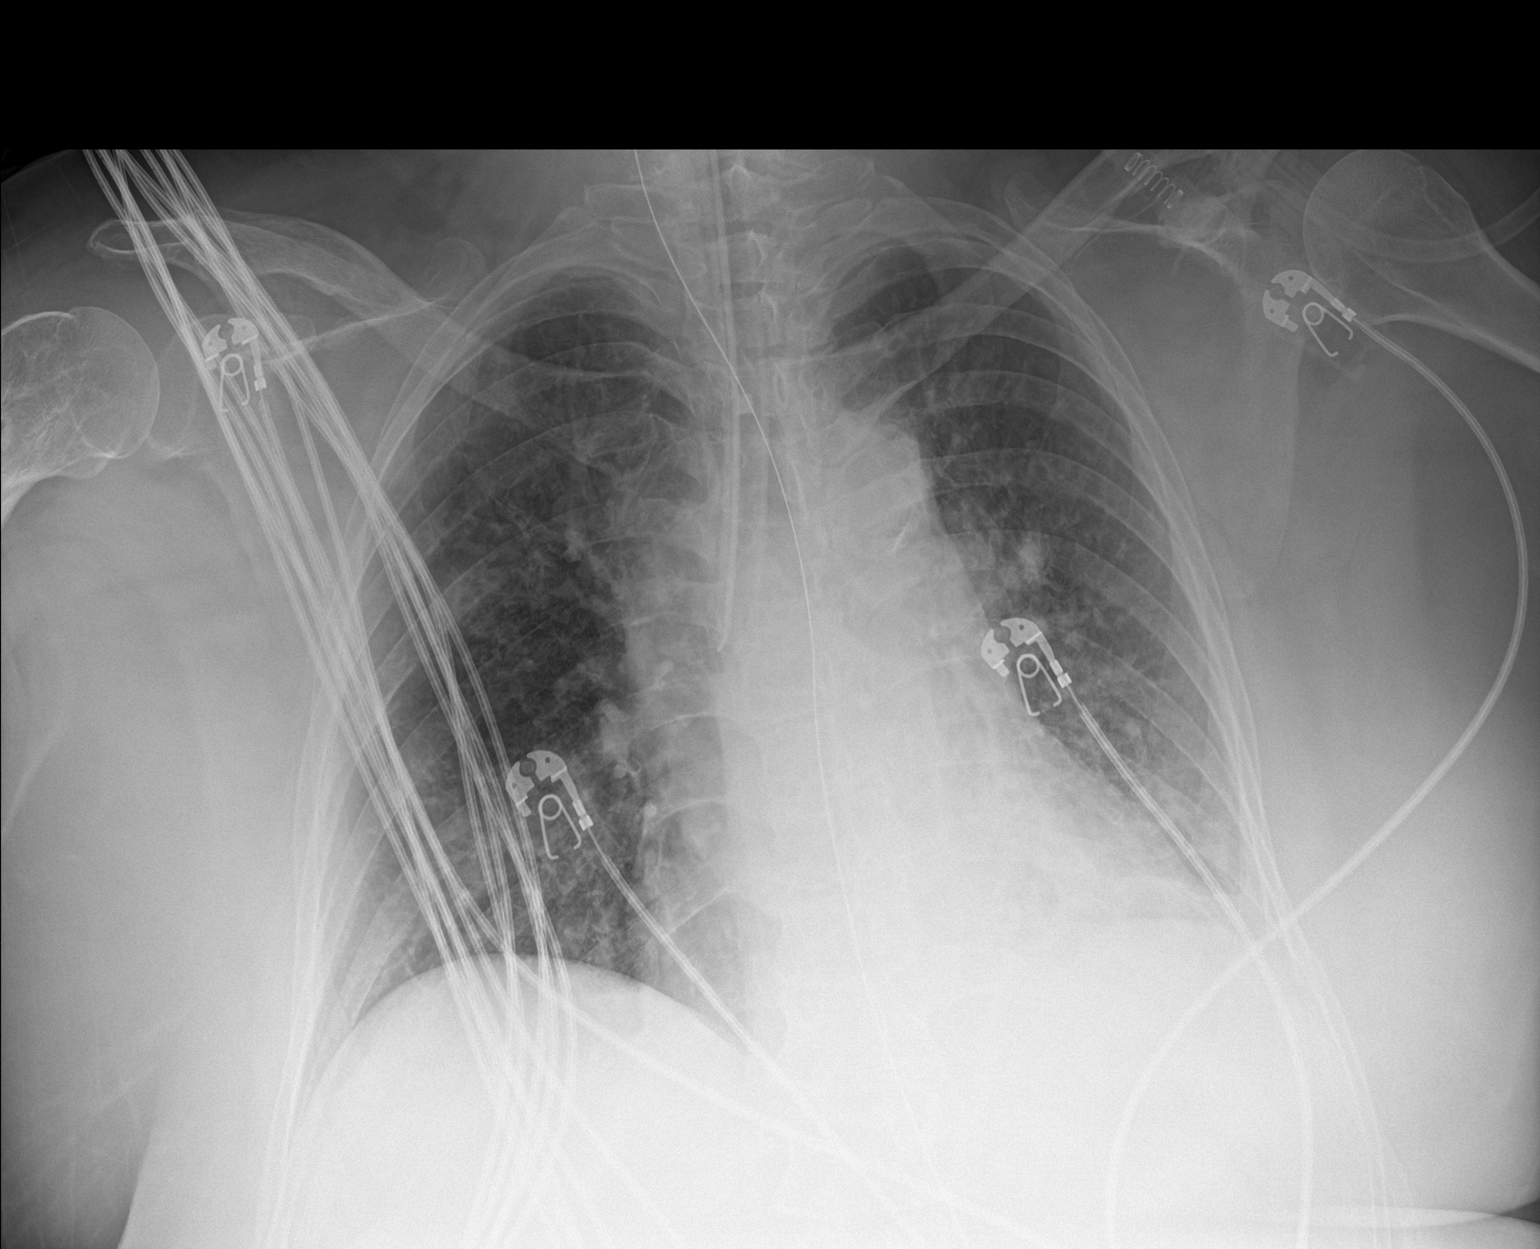

[1 of 1 positions shown; findings below may reference images not displayed]

FINDINGS: Tip of the endotracheal tube in the right mainstem bronchus. Enteric
tube in place with tip below the diaphragm, side-port not well
visualized. Unchanged heart size and mediastinal contours.
Increasing retrocardiac opacity with possible left pleural effusion.
Vascular congestion again seen. No pneumothorax. No acute osseous
abnormalities.
IMPRESSION: 1. Right mainstem intubation. Recommend retraction of 3.5 cm for
optimal placement. Tip of the enteric tube below the diaphragm.
2. Increasing retrocardiac opacity with possible left pleural
effusion, aspiration is considered. Vascular congestion again seen.

These results were called by telephone at the time of interpretation
on 08/21/2018 at [DATE] to Dr. KAORI AARONS , who verbally
acknowledged these results.

## 2020-09-21 ENCOUNTER — Telehealth: Payer: Self-pay | Admitting: Adult Health

## 2020-09-21 IMAGING — DX DG CHEST 1V PORT
1 series · 1 of 1 positions shown · non-contrast
Comparison: Radiograph August 21, 2018.

CLINICAL DATA: Respiratory failure.

EXAM:
PORTABLE CHEST 1 VIEW

[chest]
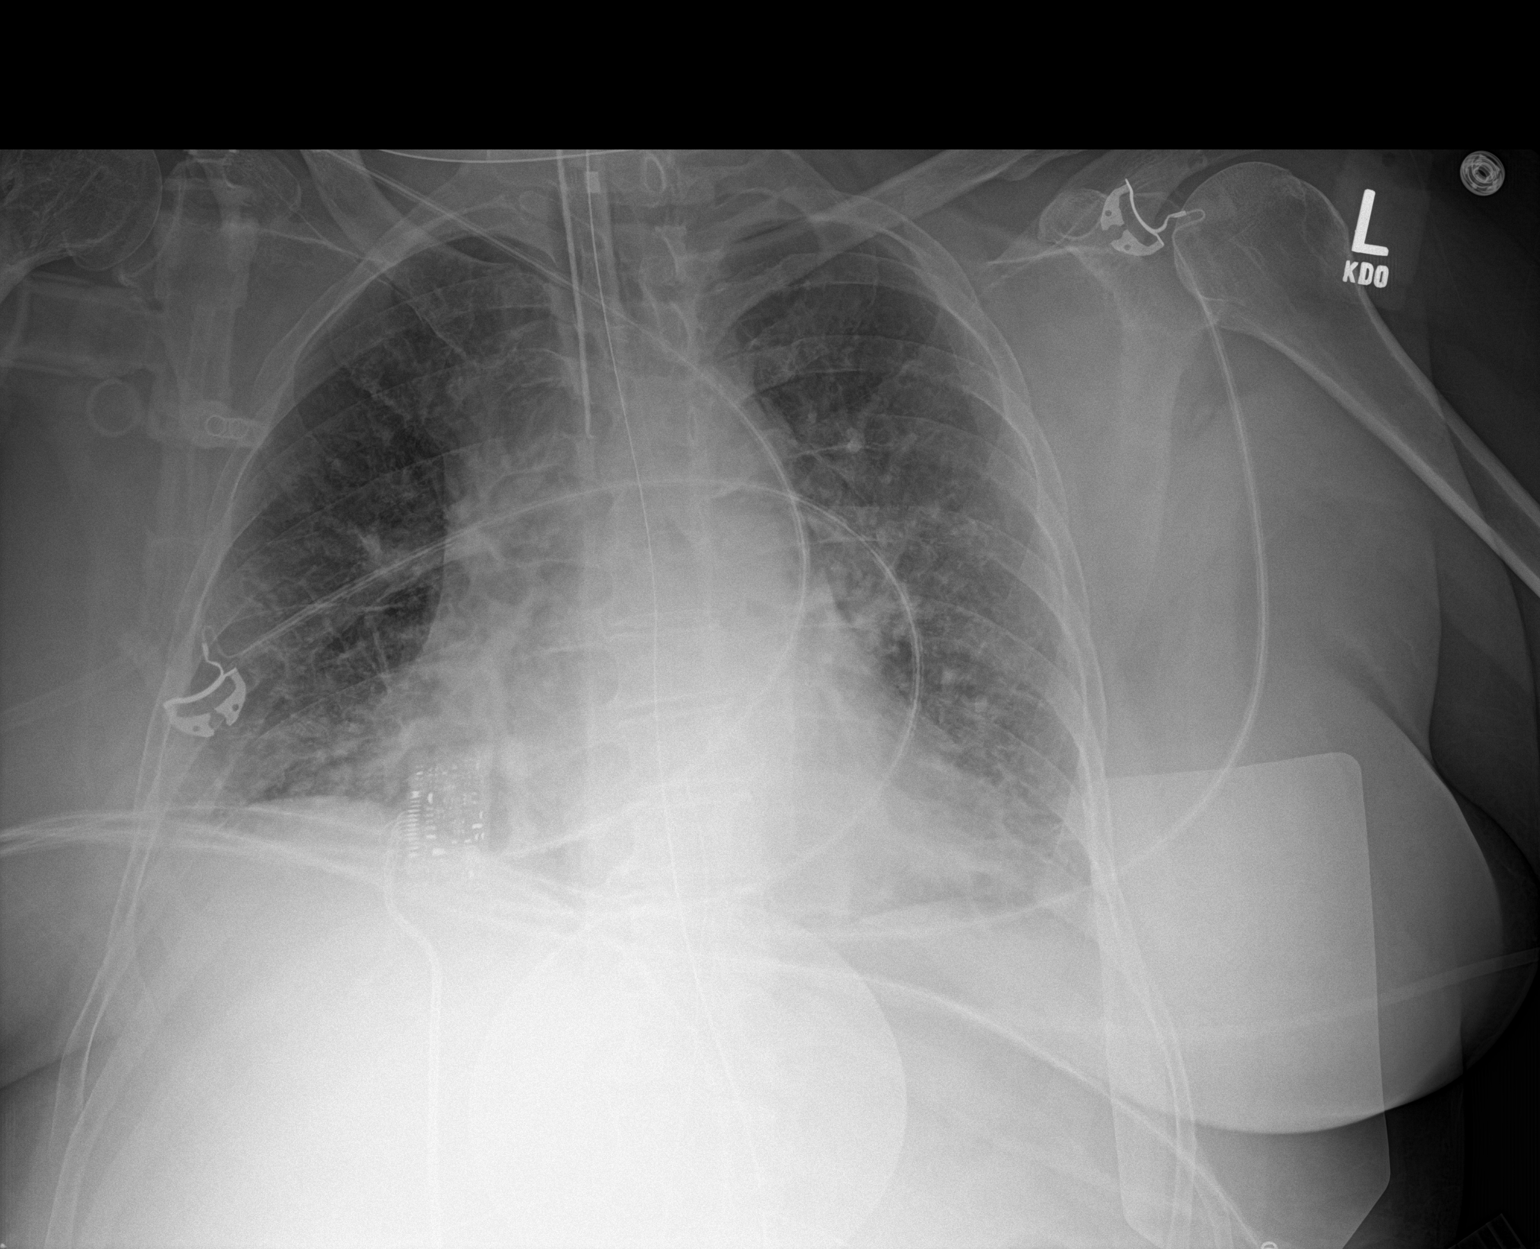

[1 of 1 positions shown; findings below may reference images not displayed]

FINDINGS: Stable cardiomediastinal silhouette. Endotracheal tube has been
withdrawn and is in grossly good position with distal tip 3 cm above
the carina. Nasogastric tube is in good position. No pneumothorax is
noted. Mild perihilar and bibasilar opacities are noted concerning
for edema or possibly atelectasis. Minimal pleural effusions may be
present. Bony thorax is unremarkable.
IMPRESSION: Endotracheal and nasogastric tubes are in grossly good position.
Mild bilateral perihilar and bibasilar opacities are noted
suggesting atelectasis or possibly edema. Minimal pleural effusions.

## 2020-09-21 IMAGING — CT CT HEAD W/O CM
3 of 8 series · 12 of 47 positions shown, 14 images · non-contrast
Comparison: CT HEAD July 22, 2018

CLINICAL DATA: Altered mental status.  History of stroke.

EXAM:
CT HEAD WITHOUT CONTRAST
CT CERVICAL SPINE WITHOUT CONTRAST
TECHNIQUE: Multidetector CT imaging of the head and cervical spine was
performed following the standard protocol without intravenous
contrast. Multiplanar CT image reconstructions of the cervical spine
were also generated.

[Series 6: cor soft · coronal · 0.32mm/px · 3 of 64 slices shown]
[im 16/64  brain]
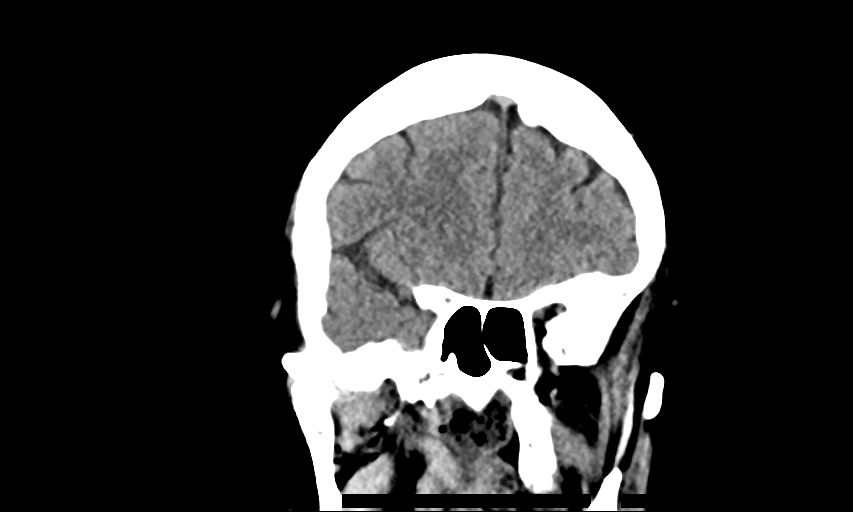
[im 32/64  brain]
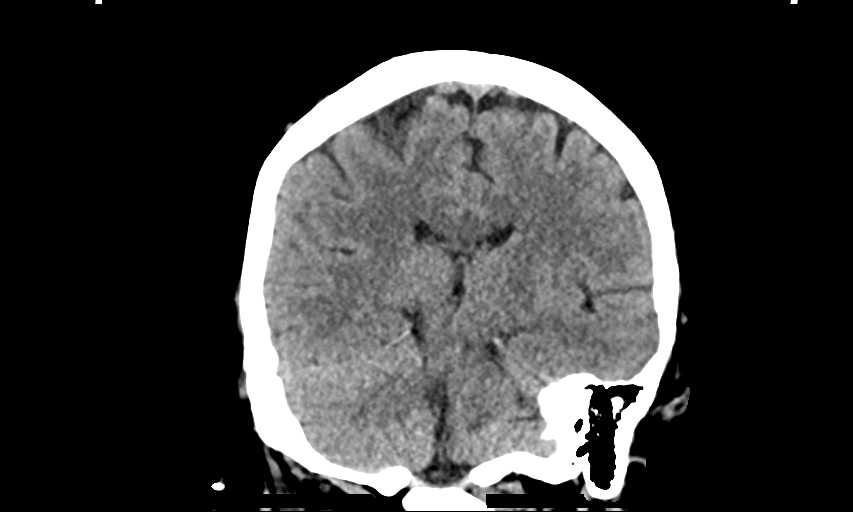
[im 48/64  brain]
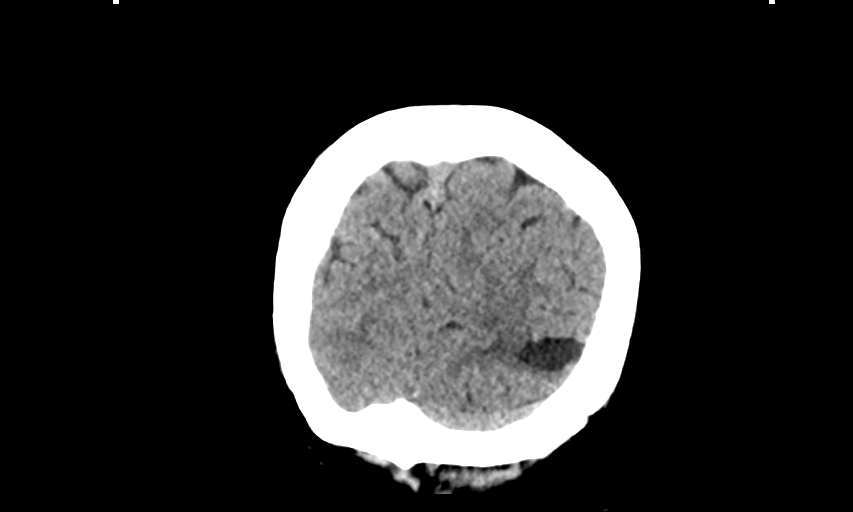

[Series 7: sag soft · sagittal · 0.32mm/px · 2 of 67 slices shown]
[im 23/67  brain]
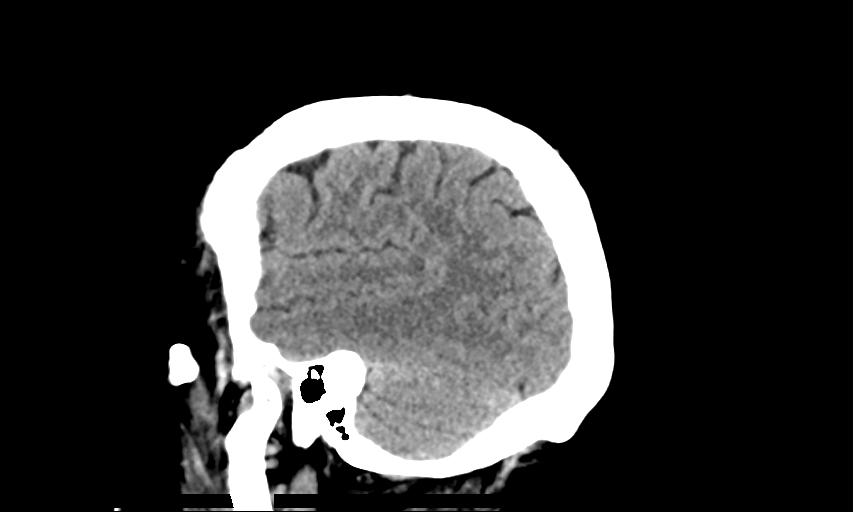
[im 45/67  brain]
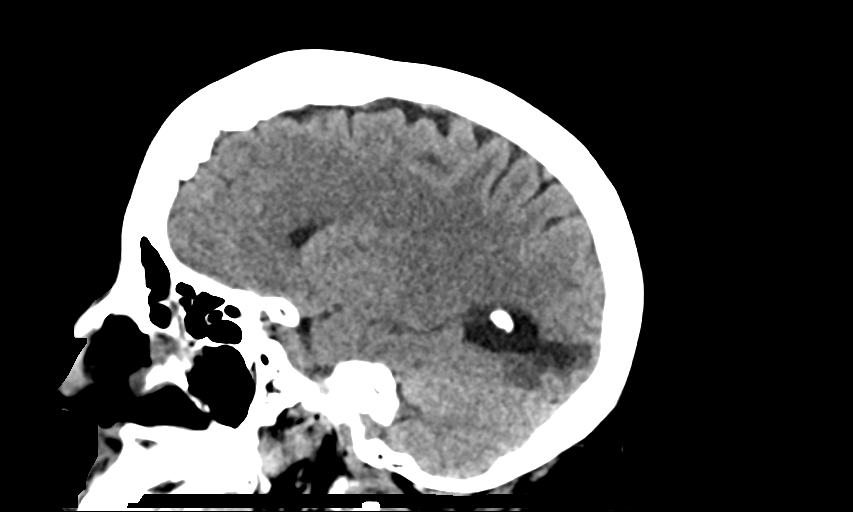

[Series 12: orthogonal axials · axial · 0.21mm/px · z∈[-346,-220]mm · 7 of 92 slices shown, 9 images]
[im 12/92  brain]
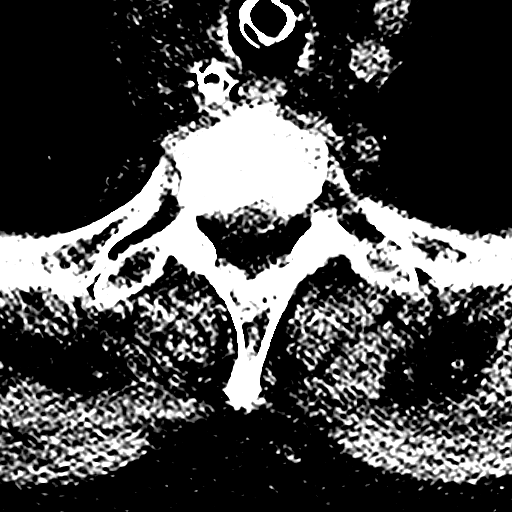
[im 12/92  bone]
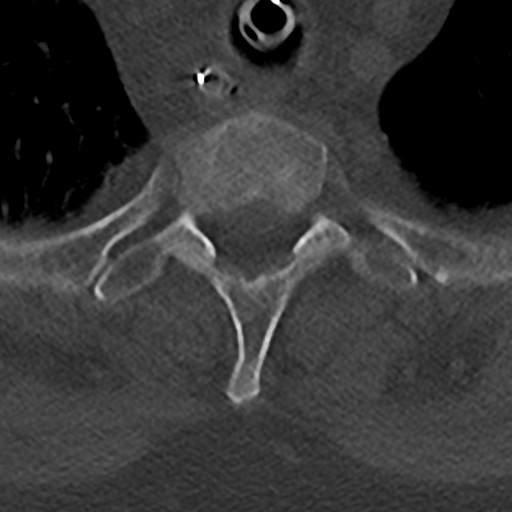
[im 23/92  brain]
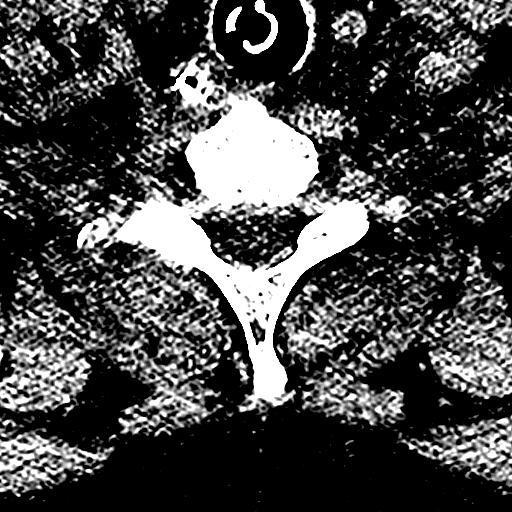
[im 35/92  brain]
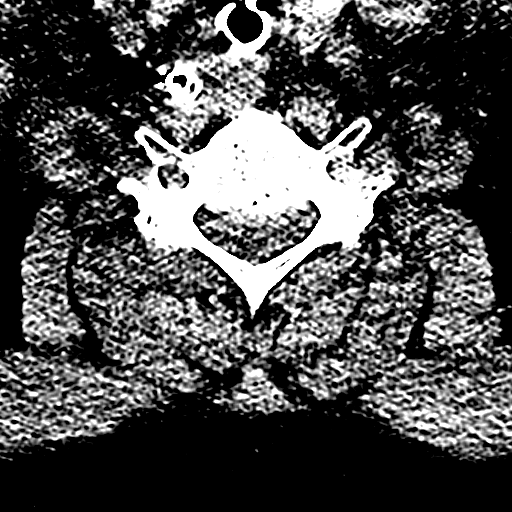
[im 46/92  brain]
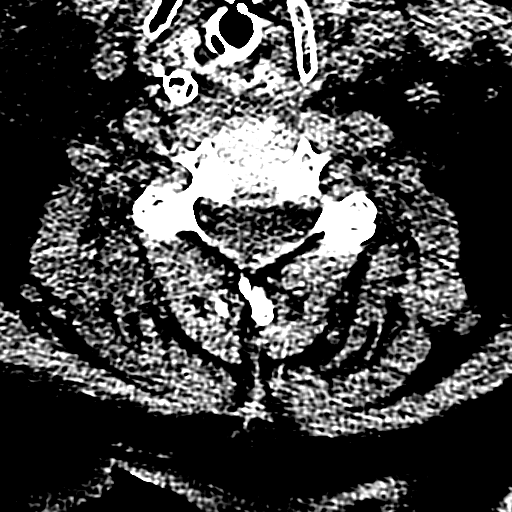
[im 57/92  brain]
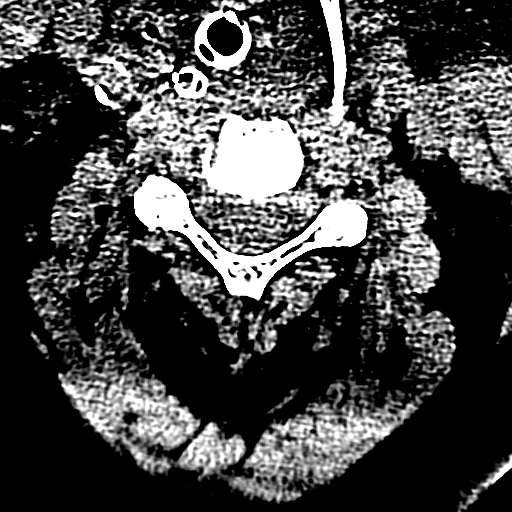
[im 57/92  bone]
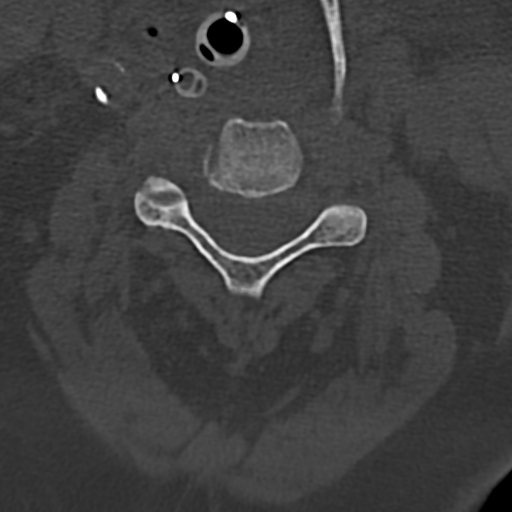
[im 69/92  brain]
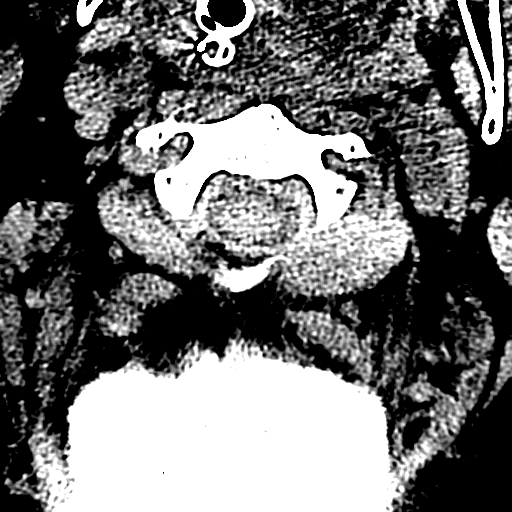
[im 80/92  brain]
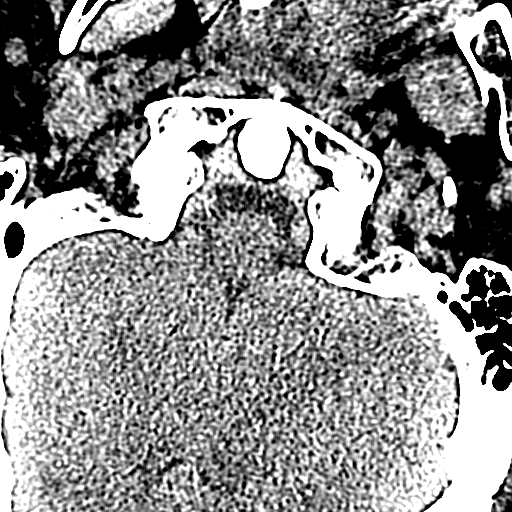

[12 of 47 positions shown; findings below may reference images not displayed]

FINDINGS: CT HEAD FINDINGS

BRAIN: No intraparenchymal hemorrhage, mass effect nor midline
shift. LEFT occipital lobe encephalomalacia with ex vacuo dilatation
subjacent ventricle. No acute large vascular territory infarcts. No
abnormal extra-axial fluid collections. Basal cisterns are patent.

VASCULAR: Mild calcific atherosclerosis carotid siphon.

SKULL/SOFT TISSUES: No skull fracture. No significant soft tissue
swelling.

ORBITS/SINUSES: The included ocular globes and orbital contents are
normal.Moderate lobulated paranasal sinus mucosal thickening.
Mastoid air cells are well aerated.

OTHER: Life support lines in place. Subcutaneous gas within included
face soft tissues. Multiple dental caries.

CT CERVICAL SPINE FINDINGS

ALIGNMENT: Straightened lordosis.  Vertebral bodies in alignment.

SKULL BASE AND VERTEBRAE: Cervical vertebral bodies and posterior
elements are intact. Moderate C5-6 and mild C6-7 disc height loss
with endplate spurring compatible with degenerative discs. T1 and T2
Schmorl's nodes. No destructive bony lesions. C1-2 articulation
maintained.

SOFT TISSUES AND SPINAL CANAL: Life support lines in place. Mild
calcific atherosclerosis carotid bifurcations. RIGHT greater than
LEFT anterior neck subcutaneous fat stranding, possibly
postprocedural. Small volume anterior neck subcutaneous gas.

DISC LEVELS: No significant osseous canal stenosis or neural
foraminal narrowing.

UPPER CHEST: Lung apices are clear.

OTHER: None.
IMPRESSION: CT HEAD:

1. No acute intracranial process.
2. Stable examination including old LEFT occipital lobe infarct.

CT CERVICAL SPINE:

1. No fracture or malalignment.
2. Life support lines in place.

## 2020-09-21 NOTE — Telephone Encounter (Signed)
Called pt and LMVM for her to return call.  Lamotrigine prescribed iniitally by Dr. Sherryll Burger? For what?  Been taking??

## 2020-09-21 NOTE — Telephone Encounter (Signed)
I called pt.  She asked about her lamotrigine dose and her levetiracetam dose.  I relayed that she should be on lamotrigine 100mg  po bid, and levetiracetam 750mg  po bid.  She had bottle of 500mg  levetiracetam and she will get rid of this to not get confused.  Made appt for her to come in 10-10-20 at 0900.  She was ok to come in.

## 2020-09-21 NOTE — Telephone Encounter (Signed)
Pt. states she needs to speak with RN regarding which dose to take of lamoTRIgine (LAMICTAL) 100 MG tablet. Please advise.

## 2020-10-10 ENCOUNTER — Ambulatory Visit: Payer: Self-pay | Admitting: Adult Health

## 2020-10-17 ENCOUNTER — Other Ambulatory Visit: Payer: Self-pay | Admitting: *Deleted

## 2020-10-17 MED ORDER — AJOVY 225 MG/1.5ML ~~LOC~~ SOAJ
225.0000 mg | SUBCUTANEOUS | 5 refills | Status: DC
Start: 2020-10-17 — End: 2022-02-20

## 2020-10-18 ENCOUNTER — Telehealth: Payer: Self-pay | Admitting: Adult Health

## 2020-10-18 NOTE — Telephone Encounter (Addendum)
Called pharmacy and need PA on the ajovy. No change in insurance per pt.  She has noted decrease in frequency and intensity of her migraines. Initiated [facility] [place] [facility] for ajovy 225mg /1.62ml. [id number]. Determination 5 days.

## 2020-10-18 NOTE — Telephone Encounter (Signed)
Pt states she has checked with [id number] re: her Ajovy and they have not received anything from [facility] re: her refill.  Pt is asking if the pharmacy can be reached out to again by RN

## 2020-10-25 NOTE — Telephone Encounter (Signed)
Called [place] [facility] and no answer from person once getting thru prompts.  Will try tomorrow.  Could not pull up from portal or no answer on CMM (stated to call plan).

## 2020-10-26 NOTE — Telephone Encounter (Signed)
On [place] [facility],  Denied AJOVY.

## 2020-10-30 ENCOUNTER — Emergency Department: Payer: Medicaid Other

## 2020-10-30 ENCOUNTER — Telehealth: Payer: Self-pay | Admitting: *Deleted

## 2020-10-30 ENCOUNTER — Emergency Department
Admission: EM | Admit: 2020-10-30 | Discharge: 2020-10-30 | Discharge status: Against Medical Advice | Payer: Medicaid Other | Attending: Emergency Medicine | Admitting: Emergency Medicine

## 2020-10-30 ENCOUNTER — Encounter: Payer: Self-pay | Admitting: Emergency Medicine

## 2020-10-30 ENCOUNTER — Other Ambulatory Visit: Payer: Self-pay

## 2020-10-30 DIAGNOSIS — Z79899 Other long term (current) drug therapy: Secondary | ICD-10-CM | POA: Insufficient documentation

## 2020-10-30 DIAGNOSIS — I10 Essential (primary) hypertension: Secondary | ICD-10-CM | POA: Diagnosis not present

## 2020-10-30 DIAGNOSIS — Z7984 Long term (current) use of oral hypoglycemic drugs: Secondary | ICD-10-CM | POA: Diagnosis not present

## 2020-10-30 DIAGNOSIS — R1110 Vomiting, unspecified: Secondary | ICD-10-CM | POA: Diagnosis not present

## 2020-10-30 DIAGNOSIS — E119 Type 2 diabetes mellitus without complications: Secondary | ICD-10-CM | POA: Diagnosis not present

## 2020-10-30 DIAGNOSIS — Z8673 Personal history of transient ischemic attack (TIA), and cerebral infarction without residual deficits: Secondary | ICD-10-CM | POA: Diagnosis not present

## 2020-10-30 DIAGNOSIS — F17210 Nicotine dependence, cigarettes, uncomplicated: Secondary | ICD-10-CM | POA: Diagnosis not present

## 2020-10-30 DIAGNOSIS — R519 Headache, unspecified: Secondary | ICD-10-CM

## 2020-10-30 LAB — COMPREHENSIVE METABOLIC PANEL
ALT: 17 U/L (ref 0–44)
AST: 24 U/L (ref 15–41)
Albumin: 4.1 g/dL (ref 3.5–5.0)
Alkaline Phosphatase: 69 U/L (ref 38–126)
Anion gap: 7 (ref 5–15)
BUN: 8 mg/dL (ref 6–20)
CO2: 25 mmol/L (ref 22–32)
Calcium: 9.3 mg/dL (ref 8.9–10.3)
Chloride: 108 mmol/L (ref 98–111)
Creatinine, Ser: 0.74 mg/dL (ref 0.44–1.00)
GFR, Estimated: >60 mL/min (ref >60)
Glucose, Bld: 87 mg/dL (ref 70–99)
Potassium: 3.8 mmol/L (ref 3.5–5.1)
Sodium: 140 mmol/L (ref 135–145)
Total Bilirubin: 0.5 mg/dL (ref 0.3–1.2)
Total Protein: 7.4 g/dL (ref 6.5–8.1)

## 2020-10-30 LAB — DIFFERENTIAL
Abs Immature Granulocytes: 0.01 10*3/uL (ref 0.00–0.07)
Basophils Absolute: 0 10*3/uL (ref 0.0–0.1)
Basophils Relative: 1 %
Eosinophils Absolute: 0.1 10*3/uL (ref 0.0–0.5)
Eosinophils Relative: 1 %
Immature Granulocytes: 0 %
Lymphocytes Relative: 45 %
Lymphs Abs: 2.9 10*3/uL (ref 0.7–4.0)
Monocytes Absolute: 0.6 10*3/uL (ref 0.1–1.0)
Monocytes Relative: 9 %
Neutro Abs: 2.9 10*3/uL (ref 1.7–7.7)
Neutrophils Relative %: 44 %

## 2020-10-30 LAB — CBC
HCT: 41.3 % (ref 36.0–46.0)
Hemoglobin: 13.7 g/dL (ref 12.0–15.0)
MCH: 29.7 pg (ref 26.0–34.0)
MCHC: 33.2 g/dL (ref 30.0–36.0)
MCV: 89.6 fL (ref 80.0–100.0)
Platelets: 188 10*3/uL (ref 150–400)
RBC: 4.61 MIL/uL (ref 3.87–5.11)
RDW: 15.6 % — ABNORMAL HIGH (ref 11.5–15.5)
WBC: 6.5 10*3/uL (ref 4.0–10.5)
nRBC: 0 % (ref 0.0–0.2)

## 2020-10-30 LAB — APTT: aPTT: 30 seconds (ref 24–36)

## 2020-10-30 LAB — PROTIME-INR
INR: 1 (ref 0.8–1.2)
Prothrombin Time: 12.5 seconds (ref 11.4–15.2)

## 2020-10-30 MED ORDER — KETOROLAC TROMETHAMINE 30 MG/ML IJ SOLN
30.0000 mg | Freq: Once | INTRAMUSCULAR | Status: DC
  Filled 2020-10-30: qty 1

## 2020-10-30 MED ORDER — MAGNESIUM SULFATE 2 GM/50ML IV SOLN
2.0000 g | Freq: Once | INTRAVENOUS | Status: AC
  Administered 2020-10-30: 2 g via INTRAVENOUS
  Filled 2020-10-30: qty 50

## 2020-10-30 MED ORDER — SODIUM CHLORIDE 0.9% FLUSH
3.0000 mL | Freq: Once | INTRAVENOUS | Status: AC
Start: 2020-10-30 — End: 2020-10-30
  Administered 2020-10-30: 3 mL via INTRAVENOUS

## 2020-10-30 MED ORDER — DEXAMETHASONE SODIUM PHOSPHATE 10 MG/ML IJ SOLN
10.0000 mg | Freq: Once | INTRAMUSCULAR | Status: AC
  Administered 2020-10-30: 10 mg via INTRAVENOUS
  Filled 2020-10-30: qty 1

## 2020-10-30 MED ORDER — METOCLOPRAMIDE HCL 5 MG/ML IJ SOLN
10.0000 mg | Freq: Once | INTRAMUSCULAR | Status: AC
  Administered 2020-10-30: 10 mg via INTRAVENOUS
  Filled 2020-10-30: qty 2

## 2020-10-30 MED ORDER — DIPHENHYDRAMINE HCL 50 MG/ML IJ SOLN
50.0000 mg | Freq: Once | INTRAMUSCULAR | Status: AC
  Administered 2020-10-30: 50 mg via INTRAVENOUS
  Filled 2020-10-30: qty 1

## 2020-10-30 NOTE — Telephone Encounter (Signed)
reattempting to get PA for ajovy on [place] [facility]. ([employer] form)  MM to sign then will fax.

## 2020-10-30 NOTE — ED Triage Notes (Addendum)
Pt in w/severe migraine since 2330, states she began vomiting off and on since 0100. C/o some blurred vision. Does take Emgala and Imitrex, just recently started Neurtec as well. Does have hx of migraines and ICH CVA back in 2017. No neuro deficits noted in triage. BP 164/90 - typically 110's/70's. No thinners, states she stopped taking plavix 1 mo ago

## 2020-10-30 NOTE — ED Provider Notes (Signed)
[facility] Emergency Department Provider Note   ____________________________________________   Event Date/Time   First MD Initiated Contact with Patient 10/30/20 1407     (approximate)  I have reviewed the triage vital signs and the nursing notes.   HISTORY  Chief Complaint Migraine and Emesis    HPI Rayven B Butsch is a 55 y.o. female with a past medical history of type 2 diabetes, migraines, hypertension, IBS, and chronic sciatica who presents for a headache and vomiting that patient states has been present since 2330.  Patient does also complain of some blurred vision which is normal for her migraines.  Patient states that she takes Imitrex and Emgala as well as recently starting Nurtec for her migraine treatment.  Patient also states that she had a CVA in 2017 and has had worsening migraine since then.  Patient currently denies any tinnitus, difficulty speaking, facial droop, sore throat, chest pain, shortness of breath, abdominal pain, diarrhea, dysuria, or weakness/numbness/paresthesias in any extremity         Past Medical History:  Diagnosis Date  . Diabetes mellitus without complication (HCC)   . Difficult intubation   . Diverticulitis   . Headache   . Hepatitis C    treated and resolved  . History of kidney stones   . Hypertension   . IBS (irritable bowel syndrome)   . Kidney stones   . Sciatica   . Sleep apnea    on cpap  . Stroke [facility]) 2017   memory loss    Patient Active Problem List   Diagnosis Date Noted  . Hypotension 08/22/2018  . Bradycardia 08/22/2018  . Type 2 diabetes mellitus (HCC) 08/22/2018  . Acute encephalopathy 08/21/2018  . Shoulder pain, right 12/22/2017  . UTI (urinary tract infection) 08/21/2017  . Nausea & vomiting 08/21/2017  . Nicotine dependence 08/21/2017  . Headache 01/07/2017  . Small bowel obstruction (HCC) 09/02/2016  . Pain and swelling of left upper extremity 07/22/2016  . Superficial  venous thrombosis of arm, left 07/22/2016  . ICH (intracerebral hemorrhage) (HCC)   . Essential hypertension, malignant 04/19/2016  . Cytotoxic brain edema (HCC) 04/19/2016  . IVH (intraventricular hemorrhage) (HCC) 04/18/2016  . Hepatitis C     Past Surgical History:  Procedure Laterality Date  . ABDOMINAL HYSTERECTOMY    . carpel tunnel syndrome  2017  . HARDWARE REMOVAL Right 12/22/2017   Procedure: HARDWARE REMOVAL-RIGHT ARM;  Surgeon: Lyndle Herrlich, MD;  Location: [facility] ORS;  Service: Orthopedics;  Laterality: Right;  Removal of implant, superficial right humerus  . HUMERUS IM NAIL Right 03/11/2017   Procedure: INTRAMEDULLARY (IM) NAIL HUMERAL;  Surgeon: Lyndle Herrlich, MD;  Location: [facility] ORS;  Service: Orthopedics;  Laterality: Right;  . KIDNEY STONE SURGERY Right 02/12/12  . KIDNEY STONE SURGERY Left 07/15/2012  . LIGATION OF ARTERIOVENOUS  FISTULA Left 06/21/2016   Procedure: LIGATION OF ARTERIOVENOUS  FISTULA ( LIGATION BASILIC VEIN );  Surgeon: Renford Dills, MD;  Location: [facility] ORS;  Service: Vascular;  Laterality: Left;  . LITHOTRIPSY    . OOPHORECTOMY    . SINUS EXPLORATION    . TONSILLECTOMY      Prior to Admission medications   Medication Sig Start Date End Date Taking? Authorizing Provider  alprazolam Prudy Feeler) 2 MG tablet Take 2 mg by mouth 2 (two) times daily as needed for anxiety. 08/04/19   [provider]  ARIPiprazole (ABILIFY) 5 MG tablet Take 5 mg by mouth daily. 06/16/20  [provider]  budesonide-formoterol (SYMBICORT) 160-4.5 MCG/ACT inhaler Inhale 2 puffs into the lungs 2 (two) times daily.    [provider]  cetirizine (ZYRTEC) 10 MG tablet Take 10 mg by mouth daily. 06/18/20   [provider]  Cetirizine HCl (ZYRTEC PO) Take by mouth.    [provider]  chlorthalidone (HYGROTON) 25 MG tablet Take 25 mg by mouth daily. 07/30/19   [provider]  cholecalciferol (VITAMIN D3) 25 MCG (1000 UT)  tablet Take 1,000 Units by mouth daily.    [provider]  cloNIDine (CATAPRES) 0.2 MG tablet Take 0.2 mg by mouth 2 (two) times daily. 10/02/18   [provider]  clopidogrel (PLAVIX) 75 MG tablet Take 1 tablet (75 mg total) by mouth daily. 06/27/16   Marvel Plan, MD  dexamethasone (DECADRON) 2 MG tablet Take 1 tablet (2 mg total) by mouth 2 (two) times daily with a meal. 02/18/20   York Spaniel, MD  etodolac (LODINE) 300 MG capsule Take 1 capsule (300 mg total) by mouth 3 (three) times daily as needed (pain). 01/16/19   Linwood Dibbles, MD  FARXIGA 10 MG TABS tablet Take 10 mg by mouth daily. 03/10/20   [provider]  FLUoxetine (PROZAC) 40 MG capsule Take 40 mg by mouth daily.  06/28/18   [provider]  Fremanezumab-vfrm (AJOVY) 225 MG/1.5ML SOAJ Inject 225 mg into the skin every 30 (thirty) days. 10/17/20   Butch Penny, NP  glipiZIDE (GLUCOTROL) 5 MG tablet Take 1 tablet (5 mg total) by mouth 2 (two) times daily before a meal. 04/30/16   Lora Paula, MD  hydrALAZINE (APRESOLINE) 100 MG tablet Take 100 mg by mouth 3 (three) times daily. 07/30/19   [provider]  hydrOXYzine (ATARAX/VISTARIL) 25 MG tablet Take 25 mg by mouth daily. 07/06/20   [provider]  lamoTRIgine (LAMICTAL) 100 MG tablet Take 1 tablet (100 mg total) by mouth 2 (two) times daily. 06/20/20   York Spaniel, MD  levETIRAcetam (KEPPRA) 750 MG tablet Take 1 tablet (750 mg total) by mouth 2 (two) times daily. 08/22/20   York Spaniel, MD  losartan (COZAAR) 50 MG tablet Take 50 mg by mouth daily. 06/19/20   [provider]  omeprazole (PRILOSEC) 40 MG capsule Take 40 mg by mouth daily as needed for heartburn. 08/13/19   [provider]  promethazine (PHENERGAN) 25 MG tablet Take 1 tablet (25 mg total) by mouth every 6 (six) hours as needed for nausea or vomiting. Patient not taking: Reported on 07/22/2020 04/21/19   Horton, Mayer Masker, MD   Rimegepant Sulfate (NURTEC) 75 MG TBDP Take 1 tablet daily onset of migraine. 07/20/20   Butch Penny, NP  traZODone (DESYREL) 150 MG tablet Take 150 mg by mouth at bedtime as needed for sleep.  05/30/20   [provider]  TRINTELLIX 10 MG TABS tablet Take 10 mg by mouth daily. 04/05/20   [provider]    Allergies Gabapentin, Ibuprofen, Tylenol [acetaminophen], Aspirin, Buprenorphine hcl, Carbamazepine, Codeine, Compazine, Elemental sulfur, Ioxaglate, Ivp dye [iodinated diagnostic agents], Metrizamide, Naproxen, Norco [hydrocodone-acetaminophen], Other, Penicillin g, Prochlorperazine maleate, Reglan [metoclopramide], Toradol [ketorolac tromethamine], Tramadol, and Zofran [ondansetron hcl]  Family History  Problem Relation Age of Onset  . Heart failure Mother   . Diabetes Father   . Cancer Sister     Social History Social History   Tobacco Use  . Smoking status: Current Every Day Smoker    Packs/day: 1.00  Years: 1.50    Pack years: 1.50    Types: Cigarettes  . Smokeless tobacco: Never Used  Vaping Use  . Vaping Use: Never used  Substance Use Topics  . Alcohol use: Not Currently    Comment: no alcohol since 2002  . Drug use: No    Comment: electric cig    Review of Systems Constitutional: No fever/chills Eyes: Endorses visual changes. ENT: No sore throat. Cardiovascular: Denies chest pain. Respiratory: Denies shortness of breath. Gastrointestinal: No abdominal pain.  No nausea, no vomiting.  No diarrhea. Genitourinary: Negative for dysuria. Musculoskeletal: Negative for acute arthralgias Skin: Negative for rash. Neurological: Positive for headaches, negative weakness/numbness/paresthesias in any extremity Psychiatric: Negative for suicidal ideation/homicidal ideation   ____________________________________________   PHYSICAL EXAM:  VITAL SIGNS: ED Triage Vitals  Enc Vitals Group     BP 10/30/20 1258 (!) 164/90     Pulse Rate 10/30/20  1258 70     Resp 10/30/20 1258 17     Temp 10/30/20 1258 97.8 F (36.6 C)     Temp Source 10/30/20 1258 Oral     SpO2 10/30/20 1258 99 %     Weight 10/30/20 1259 190 lb (86.2 kg)     Height 10/30/20 1259 5\' 2"  (1.575 m)     Head Circumference --      Peak Flow --      Pain Score 10/30/20 1618 6     Pain Loc --      Pain Edu? --      Excl. in GC? --    Constitutional: Alert and oriented. Well appearing and in no acute distress. Eyes: Conjunctivae are normal. PERRL. Head: Atraumatic. Nose: No congestion/rhinnorhea. Mouth/Throat: Mucous membranes are moist. Neck: No stridor Cardiovascular: Grossly normal heart sounds.  Good peripheral circulation. Respiratory: Normal respiratory effort.  No retractions. Gastrointestinal: Soft and nontender. No distention. Musculoskeletal: No obvious deformities Neurologic:  Normal speech and language. No gross focal neurologic deficits are appreciated. Skin:  Skin is warm and dry. No rash noted. Psychiatric: Mood and affect are aggravated. Speech and behavior are normal.  ____________________________________________   LABS (all labs ordered are listed, but only abnormal results are displayed)  Labs Reviewed  CBC - Abnormal; Notable for the following components:      Result Value   RDW 15.6 (*)    All other components within normal limits  PROTIME-INR  APTT  DIFFERENTIAL  COMPREHENSIVE METABOLIC PANEL  CBG MONITORING, ED  POC URINE PREG, ED   ____________________________________________  EKG  ED ECG REPORT I, 11/01/20, the attending physician, personally viewed and interpreted this ECG.  Date: 10/30/2020 EKG Time: 1314 Rate: 65 Rhythm: normal sinus rhythm QRS Axis: normal Intervals: normal ST/T Wave abnormalities: normal Narrative Interpretation: no evidence of acute ischemia  ____________________________________________  RADIOLOGY  ED MD interpretation: CT of the head without contrast shows no evidence of acute  abnormalities including no intracerebral hemorrhage, obvious masses, or significant edema.  Of note, patient has evidence of chronic infarct in the left temporoparietal lobe that is unchanged from previous  Official radiology report(s): CT HEAD WO CONTRAST  Result Date: 10/30/2020 CLINICAL DATA:  Headache EXAM: CT HEAD WITHOUT CONTRAST TECHNIQUE: Contiguous axial images were obtained from the base of the skull through the vertex without intravenous contrast. COMPARISON:  CT head 04/20/2020 FINDINGS: Brain: Ventricle size normal. Chronic infarct left posterior temporal and parietal lobe unchanged. Negative for acute infarct, hemorrhage, mass. Vascular: Negative for hyperdense vessel Skull: Negative Sinuses/Orbits: Mild mucosal  edema paranasal sinuses. Negative orbit Other: None IMPRESSION: No acute abnormality Chronic infarct left temporoparietal lobe unchanged from 2021. Electronically Signed   By: Marlan Palauharles  Clark M.D.   On: 10/30/2020 13:38    ____________________________________________   PROCEDURES  Procedure(s) performed (including Critical Care):  Procedures   ____________________________________________   INITIAL IMPRESSION / ASSESSMENT AND PLAN / ED COURSE  As part of my medical decision making, I reviewed the following data within the electronic MEDICAL RECORD NUMBER Nursing notes reviewed and incorporated, Labs reviewed, EKG interpreted, Old chart reviewed, Radiograph reviewed and Notes from prior ED visits reviewed and incorporated        Presents with Headache.  No focal neurological symptoms. Neuro exam is benign. Pt is nontoxic. VSS. Tx: Toradol, Reglan, Benadryl, Decadron, IVF Based on history and normal neurological exam I have low suspicion for intracranial tumor, intracranial bleed, meningitis, temporal arteritis, glaucoma, CO poisoning.  Most likely patient has benign headache/normal migraine, recommend rest, hydration, and ibuprofen.  Disposition: Patient requesting  IV narcotics from nursing staff multiple times and when told that she was not going to receive any, patient eloped.  I was unable to speak to patient prior to her elopement but her IV was taken out prior to her leaving.      ____________________________________________   FINAL CLINICAL IMPRESSION(S) / ED DIAGNOSES  Final diagnoses:  Acute nonintractable headache, unspecified headache type     ED Discharge Orders    None       Note:  This document was prepared using Dragon voice recognition software and may include unintentional dictation errors.   Merwyn KatosBradler, Lonald Troiani K, MD 10/30/20 [id number]

## 2020-10-30 NOTE — ED Notes (Signed)
Pt eloped , ed rn was able to remove iv cath prior to pt eloping

## 2020-10-31 NOTE — Telephone Encounter (Signed)
Faxed form to [place] [facility] for continuation of ajovy [phone].  Determination pending

## 2020-10-31 NOTE — Telephone Encounter (Signed)
signed

## 2020-11-01 NOTE — Telephone Encounter (Signed)
I relayed to pt her AJOVY did go thru and sent to [facility], she can pick up.  She ssaid nurtec did not help.  She went to ED and toradol was given and she was allergic/itching.  FYI.  If her phone cuts off she has finicky phone, may call.

## 2020-11-02 ENCOUNTER — Telehealth (INDEPENDENT_AMBULATORY_CARE_PROVIDER_SITE_OTHER): Payer: Medicaid Other | Admitting: Adult Health

## 2020-11-02 ENCOUNTER — Telehealth: Payer: Self-pay | Admitting: Adult Health

## 2020-11-02 DIAGNOSIS — G4733 Obstructive sleep apnea (adult) (pediatric): Secondary | ICD-10-CM | POA: Diagnosis not present

## 2020-11-02 DIAGNOSIS — G43719 Chronic migraine without aura, intractable, without status migrainosus: Secondary | ICD-10-CM | POA: Diagnosis not present

## 2020-11-02 NOTE — Telephone Encounter (Signed)
Called patient to advise her there are refills on file. She stated pharmacy told her Nurtec needs PA. I advised we will start PA today. Patient verbalized understanding, appreciation. Nurtec PA, key: [id number], [id number], cannot take preferred: triptans due to prior stroke and CVD. PA sent to plan.

## 2020-11-02 NOTE — Progress Notes (Signed)
  [facility] [address] [place]. [place] [address] [phone]     Virtual Visit via Telephone Note  I connected with Stephanie [name] on 11/02/20 at  8:00 AM EST by telephone located remotely at [facility] and verified that I am speaking with the correct person using two identifiers who reports being located  At home.   Visit scheduled by RN. She discussed the limitations, risks, security and privacy concerns of performing an evaluation and management service by telephone and the availability of in person appointments. I also discussed with the patient that there may be a patient responsible charge related to this service. The patient expressed understanding and agreed to proceed. See telephone note for consent and additional scheduling information.    History of Present Illness:  Stephanie [name] is a 55 y.o. female with a history of migraine headaches.  She was originally scheduled for video visit but was unable to get her camera to work.  She was transitioned to a telephone visit.  The patient recently just got Ajovy approved.  She plans to pick up this prescription today.  Reports that she woke up with a severe headache Monday.  She went to [facility] and they gave her Toradol despite the fact that she is allergic to this medication.  She states that it decreases severity of her headache but did not resolve it.  She states that she still has a mild headache.  She also reports that she will have a sleep study through her PCP tomorrow.  Patient plans to get restarted on CPAP therapy.  She states in the past when she was on CPAP she rarely had headaches.    Observations/Objective:  Generalized: Well developed, in no acute distress   Neurological examination  Mentation: Alert oriented to time, place, history taking. Follows all commands speech and language fluent  Assessment and Plan:  1: Migraine headaches  --Start Ajovy --Okay  to take Nurtec daily for 3 to 5 days to see if it resolves her current headache --Advised that when she started CPAP her headaches may improve. --She will follow-up in 3 months or sooner if needed.   Follow Up Instructions:   F/U in 3 months    I discussed the assessment and treatment plan with the patient.  The patient was provided an opportunity to ask questions and all were answered to their satisfaction. The patient agreed with the plan and verbalized an understanding of the instructions.   I spent 15 minutes on the telephone and non-face-to-face time with patient.  This included previsit chart review, lab review, study review, order entry, electronic health record documentation, patient education.    Butch Penny NP-C  [facility] [address] [place], [place] [address]  Phone [phone] Fax [phone]

## 2020-11-02 NOTE — Telephone Encounter (Signed)
Pt request refill Rimegepant Sulfate (NURTEC) 75 MG TBDP at [facility] [phone]

## 2020-11-03 NOTE — Progress Notes (Signed)
I have read the note, and I agree with the clinical assessment and plan.  Charles K Willis   

## 2020-11-08 NOTE — Telephone Encounter (Addendum)
Called [place] [facility] to check status of nurtec PA.  Spoke with Rosey Bath who stated I needed pharmacy line; she transferred call but it was cut off. Ref id #[id number] Called back spoke with Lupita Leash who stated nurtec was denied due to wrong form being submitted. New PA started, clinical questions answered. Call back for decision in 24 hours. [id number]. Interaction id [id number] for call.

## 2020-11-13 ENCOUNTER — Telehealth: Payer: Self-pay | Admitting: Neurology

## 2020-11-13 DIAGNOSIS — I611 Nontraumatic intracerebral hemorrhage in hemisphere, cortical: Secondary | ICD-10-CM

## 2020-11-13 DIAGNOSIS — I615 Nontraumatic intracerebral hemorrhage, intraventricular: Secondary | ICD-10-CM

## 2020-11-13 MED ORDER — DEXAMETHASONE 2 MG PO TABS: ORAL_TABLET | ORAL | 0 refills | Status: DC

## 2020-11-13 MED ORDER — PREDNISONE 10 MG PO TABS: ORAL_TABLET | ORAL | 0 refills | Status: DC

## 2020-11-13 NOTE — Telephone Encounter (Signed)
Repeat call:  Dr. Anne Hahn just called a medication for the patient, Decadron- patient called back:  " the med for migraine need pre-authorization,takes 14 days, I  need another medication for now, something else. "  The patient had already spoken to Alverda Skeans, RN and message was handled by Butch Penny and Dr. Frances Furbish.  She is set up for IV treatment tomorrow . I offered to convert the 2 mg decadron to 10 mg dexamethasone. Patient accepted.   Melvyn Novas, MD

## 2020-11-13 NOTE — Telephone Encounter (Signed)
Sent message to Ilean Skill for migraine depacon infusion tomorrow.

## 2020-11-13 NOTE — Telephone Encounter (Signed)
I called the patient.  The patient has had a headache for 2 weeks.  I will send in a prescription for a 3-day course of Decadron, I will have my nurse call her tomorrow after she talked to the infusion nurse to see if she can come in for a Depacon injection of 1000 mg tomorrow.

## 2020-11-13 NOTE — Telephone Encounter (Signed)
Patient called after-hours call center at 7:48 AM today with a message of having a really bad migraine for 2 weeks.  Cicero Duck or [place], please call patient back and get more information, then please discuss with Aundra Millet or Work in MD, if Aundra Millet out today.

## 2020-11-13 NOTE — Telephone Encounter (Signed)
Called pt and she is c/o of migrained for the last 2 wks.  She did receive ajovy last week.  Not helped.  She has not been able to get nurtec?  Will have to check on PA.  Level 9, eyes closed, listening to TV.  Having leg pain ( neuropathy another problem, given pregabalin by pcp).   Went to [facility] ED given toradol, cannot take causes itching.  Then went to [place] only option was toradol, she did not get, had MRI, she did not know results.  Has n/v takes phenergan works sometimes.  ? cpap use

## 2020-11-13 NOTE — Telephone Encounter (Signed)
We can offer several things:  She can have a Depakote infusion --unsure if she has had this in the past?  Can do 1000 milligrams IV. OR She can get Nurtec and take 1 tablet daily for the next 5 days to see if this breaks of her headache  Also please inquire if the patient has started CPAP --this is not managed through our office.  In the past she has reported that when she was on CPAP she did not have headaches.  I would strongly encouraged the patient to get started back on CPAP as this may be why medication is not working for her headaches

## 2020-11-13 NOTE — Addendum Note (Signed)
Addended by: York Spaniel on: 11/13/2020 04:55 PM   Modules accepted: Orders

## 2020-11-13 NOTE — Telephone Encounter (Addendum)
Called [place] [facility], to check status of nurtec PA, spoke with Lupita Leash who stated nurtec was denied due to question stating patient has dry mouth. I advised her there was nothing in her chart about this, unsure how that information was submitted. She stated a new PA can be done, clinical questions answered. PA #[id number]  Decision in 24 hours. Call [phone], opt 1,3 2, 2 to get to pharmacy PA dept.  Interaction id #[id number].

## 2020-11-14 ENCOUNTER — Emergency Department (HOSPITAL_COMMUNITY)
Admission: EM | Admit: 2020-11-14 | Discharge: 2020-11-14 | Disposition: A | Discharge status: Home / Self Care | Payer: Medicaid Other | Attending: Emergency Medicine | Admitting: Emergency Medicine

## 2020-11-14 ENCOUNTER — Encounter (HOSPITAL_COMMUNITY): Payer: Self-pay

## 2020-11-14 ENCOUNTER — Other Ambulatory Visit: Payer: Self-pay

## 2020-11-14 ENCOUNTER — Encounter: Payer: Self-pay | Admitting: *Deleted

## 2020-11-14 DIAGNOSIS — R519 Headache, unspecified: Secondary | ICD-10-CM | POA: Diagnosis not present

## 2020-11-14 DIAGNOSIS — Z7902 Long term (current) use of antithrombotics/antiplatelets: Secondary | ICD-10-CM | POA: Insufficient documentation

## 2020-11-14 DIAGNOSIS — Z8669 Personal history of other diseases of the nervous system and sense organs: Secondary | ICD-10-CM | POA: Insufficient documentation

## 2020-11-14 DIAGNOSIS — Z79899 Other long term (current) drug therapy: Secondary | ICD-10-CM | POA: Diagnosis not present

## 2020-11-14 DIAGNOSIS — Z7984 Long term (current) use of oral hypoglycemic drugs: Secondary | ICD-10-CM | POA: Insufficient documentation

## 2020-11-14 DIAGNOSIS — I10 Essential (primary) hypertension: Secondary | ICD-10-CM | POA: Diagnosis not present

## 2020-11-14 DIAGNOSIS — F17210 Nicotine dependence, cigarettes, uncomplicated: Secondary | ICD-10-CM | POA: Insufficient documentation

## 2020-11-14 DIAGNOSIS — E119 Type 2 diabetes mellitus without complications: Secondary | ICD-10-CM | POA: Diagnosis not present

## 2020-11-14 MED ORDER — SODIUM CHLORIDE 0.9 % IV BOLUS
1000.0000 mL | Freq: Once | INTRAVENOUS | Status: AC
  Administered 2020-11-14: 1000 mL via INTRAVENOUS

## 2020-11-14 MED ORDER — DROPERIDOL 2.5 MG/ML IJ SOLN
1.2500 mg | Freq: Once | INTRAMUSCULAR | Status: AC
  Administered 2020-11-14: 1.25 mg via INTRAVENOUS
  Filled 2020-11-14: qty 2

## 2020-11-14 NOTE — ED Triage Notes (Addendum)
Patient states, "I have been to 2 different ER's for my migraine and they won't give me anything except for Tramadol and Toradol. They haven't helped me at all. Patient mentioned a few times that morphine works really good for her migraines.  patient c/o migraine behind her right eye x 2 weeks. patient also c/o N/V x 1 week. Patient reports light and sound sensitivity

## 2020-11-14 NOTE — ED Notes (Signed)
An After Visit Summary was printed and given to the patient. Discharge instructions given and no further questions at this time.  

## 2020-11-14 NOTE — Discharge Instructions (Signed)
Please be sure to follow-up with your physician for appropriate ongoing outpatient management of your headaches.  Return here for concerning changes in your condition.

## 2020-11-14 NOTE — Telephone Encounter (Addendum)
Called [place] [facility] to check status of nurtec PA, spoke with Alexia Freestone, pharmacy PA Dept who stated nurtec approved until 11/08/2021, pa #[id number].  Sent my chart to advise patient.

## 2020-11-14 NOTE — Telephone Encounter (Signed)
Called could not LM mailbox full.  Per Freddi Starr, Rn in [facility] 0900 available this am.

## 2020-11-14 NOTE — ED Provider Notes (Signed)
[facility]-EMERGENCY DEPT Provider Note   CSN: [id number] Arrival date & time: 11/14/20  1303     History Chief Complaint  Patient presents with  . Migraine    Stephanie [name] is a 55 y.o. female.  HPI Patient presents with headache. Patient knowledges a history of chronic migraine, as well as multiple other medical problems and chronic pain. She also has history of kidney stones, has ongoing pain she identifies a similar to do that she typically experiences. No change in this, and she notes that she is here because of her headache. This headache has been present for about 2 weeks in spite of taking home medication, and being seen and evaluated to different emergency departments. She notes that she was not provided narcotics and her most recent ED visit, and feels as though this may have contributed to her ongoing pain. She denies other new weakness, was ambulatory into the room, seemingly denies other new complaints, and does not complain of discoordination, syncope, chest pain.     Past Medical History:  Diagnosis Date  . Diabetes mellitus without complication (HCC)   . Difficult intubation   . Diverticulitis   . Headache   . Hepatitis C    treated and resolved  . History of kidney stones   . Hypertension   . IBS (irritable bowel syndrome)   . Kidney stones   . Sciatica   . Sleep apnea    on cpap  . Stroke [facility]) 2017   memory loss    Patient Active Problem List   Diagnosis Date Noted  . Hypotension 08/22/2018  . Bradycardia 08/22/2018  . Type 2 diabetes mellitus (HCC) 08/22/2018  . Acute encephalopathy 08/21/2018  . Shoulder pain, right 12/22/2017  . UTI (urinary tract infection) 08/21/2017  . Nausea & vomiting 08/21/2017  . Nicotine dependence 08/21/2017  . Headache 01/07/2017  . Small bowel obstruction (HCC) 09/02/2016  . Pain and swelling of left upper extremity 07/22/2016  . Superficial venous thrombosis of arm, left 07/22/2016   . ICH (intracerebral hemorrhage) (HCC)   . Essential hypertension, malignant 04/19/2016  . Cytotoxic brain edema (HCC) 04/19/2016  . IVH (intraventricular hemorrhage) (HCC) 04/18/2016  . Hepatitis C     Past Surgical History:  Procedure Laterality Date  . ABDOMINAL HYSTERECTOMY    . carpel tunnel syndrome  2017  . HARDWARE REMOVAL Right 12/22/2017   Procedure: HARDWARE REMOVAL-RIGHT ARM;  Surgeon: Lyndle Herrlich, MD;  Location: [facility] ORS;  Service: Orthopedics;  Laterality: Right;  Removal of implant, superficial right humerus  . HUMERUS IM NAIL Right 03/11/2017   Procedure: INTRAMEDULLARY (IM) NAIL HUMERAL;  Surgeon: Lyndle Herrlich, MD;  Location: [facility] ORS;  Service: Orthopedics;  Laterality: Right;  . KIDNEY STONE SURGERY Right 02/12/12  . KIDNEY STONE SURGERY Left 07/15/2012  . LIGATION OF ARTERIOVENOUS  FISTULA Left 06/21/2016   Procedure: LIGATION OF ARTERIOVENOUS  FISTULA ( LIGATION BASILIC VEIN );  Surgeon: Renford Dills, MD;  Location: [facility] ORS;  Service: Vascular;  Laterality: Left;  . LITHOTRIPSY    . OOPHORECTOMY    . SINUS EXPLORATION    . TONSILLECTOMY       OB History   No obstetric history on file.     Family History  Problem Relation Age of Onset  . Heart failure Mother   . Diabetes Father   . Cancer Sister     Social History   Tobacco Use  . Smoking status: Current Every Day Smoker  Packs/day: 1.00    Years: 1.50    Pack years: 1.50    Types: Cigarettes  . Smokeless tobacco: Never Used  Vaping Use  . Vaping Use: Never used  Substance Use Topics  . Alcohol use: Not Currently    Comment: no alcohol since 2002  . Drug use: No    Home Medications Prior to Admission medications   Medication Sig Start Date End Date Taking? Authorizing Provider  alprazolam Prudy Feeler) 2 MG tablet Take 2 mg by mouth 2 (two) times daily as needed for anxiety. 08/04/19   [provider]  ARIPiprazole (ABILIFY) 5 MG tablet Take 5 mg by mouth daily. 06/16/20    [provider]  budesonide-formoterol (SYMBICORT) 160-4.5 MCG/ACT inhaler Inhale 2 puffs into the lungs 2 (two) times daily.    [provider]  cetirizine (ZYRTEC) 10 MG tablet Take 10 mg by mouth daily. 06/18/20   [provider]  Cetirizine HCl (ZYRTEC PO) Take by mouth.    [provider]  chlorthalidone (HYGROTON) 25 MG tablet Take 25 mg by mouth daily. 07/30/19   [provider]  cholecalciferol (VITAMIN D3) 25 MCG (1000 UT) tablet Take 1,000 Units by mouth daily.    [provider]  cloNIDine (CATAPRES) 0.2 MG tablet Take 0.2 mg by mouth 2 (two) times daily. 10/02/18   [provider]  clopidogrel (PLAVIX) 75 MG tablet Take 1 tablet (75 mg total) by mouth daily. 06/27/16   Marvel Plan, MD  etodolac (LODINE) 300 MG capsule Take 1 capsule (300 mg total) by mouth 3 (three) times daily as needed (pain). 01/16/19   Linwood Dibbles, MD  FARXIGA 10 MG TABS tablet Take 10 mg by mouth daily. 03/10/20   [provider]  FLUoxetine (PROZAC) 40 MG capsule Take 40 mg by mouth daily.  06/28/18   [provider]  Fremanezumab-vfrm (AJOVY) 225 MG/1.5ML SOAJ Inject 225 mg into the skin every 30 (thirty) days. 10/17/20   Butch Penny, NP  glipiZIDE (GLUCOTROL) 5 MG tablet Take 1 tablet (5 mg total) by mouth 2 (two) times daily before a meal. 04/30/16   Lora Paula, MD  hydrALAZINE (APRESOLINE) 100 MG tablet Take 100 mg by mouth 3 (three) times daily. 07/30/19   [provider]  hydrOXYzine (ATARAX/VISTARIL) 25 MG tablet Take 25 mg by mouth daily. 07/06/20   [provider]  lamoTRIgine (LAMICTAL) 100 MG tablet Take 1 tablet (100 mg total) by mouth 2 (two) times daily. 06/20/20   York Spaniel, MD  levETIRAcetam (KEPPRA) 750 MG tablet Take 1 tablet (750 mg total) by mouth 2 (two) times daily. 08/22/20   York Spaniel, MD  losartan (COZAAR) 50 MG tablet Take 50 mg by mouth daily. 06/19/20   [provider]  omeprazole (PRILOSEC) 40 MG capsule Take 40 mg by mouth daily as needed for heartburn. 08/13/19   [provider]  predniSONE (DELTASONE) 10 MG tablet Take 4O mg with food once a day po for 3 days , then 20 mg for 3 days 11/13/20   Dohmeier, Porfirio Mylar, MD  promethazine (PHENERGAN) 25 MG tablet Take 1 tablet (25 mg total) by mouth every 6 (six) hours as needed for nausea or vomiting. Patient not taking: Reported on 07/22/2020 04/21/19   Horton, Mayer Masker, MD  Rimegepant Sulfate (NURTEC) 75 MG TBDP Take 1 tablet daily onset of migraine. 07/20/20   Butch Penny, NP  traZODone (DESYREL) 150 MG tablet Take 150 mg by mouth at bedtime  as needed for sleep.  05/30/20   [provider]  TRINTELLIX 10 MG TABS tablet Take 10 mg by mouth daily. 04/05/20   [provider]    Allergies    Gabapentin, Ibuprofen, Tylenol [acetaminophen], Aspirin, Buprenorphine hcl, Carbamazepine, Codeine, Compazine, Elemental sulfur, Ioxaglate, Ivp dye [iodinated diagnostic agents], Metrizamide, Naproxen, Norco [hydrocodone-acetaminophen], Other, Penicillin g, Prochlorperazine maleate, Reglan [metoclopramide], Toradol [ketorolac tromethamine], Tramadol, and Zofran [ondansetron hcl]  Review of Systems   Review of Systems  Constitutional:       Per HPI, otherwise negative  HENT:       Per HPI, otherwise negative  Respiratory:       Per HPI, otherwise negative  Cardiovascular:       Per HPI, otherwise negative  Gastrointestinal: Positive for abdominal pain.  Endocrine:       Negative aside from HPI  Genitourinary:       Neg aside from HPI   Musculoskeletal:       Per HPI, otherwise negative  Skin: Negative.   Neurological: Positive for headaches. Negative for syncope and weakness.    Physical Exam Updated Vital Signs BP (!) 149/99   Pulse 90   Temp (!) 97.5 F (36.4 C) (Oral)   Resp 18   Ht 5\' 2"  (1.575 m)   Wt 93.4 kg   SpO2 99%   BMI 37.68 kg/m   Physical  Exam Vitals and nursing note reviewed.  Constitutional:      General: She is not in acute distress.    Appearance: She is well-developed and well-nourished. She is not ill-appearing or diaphoretic.  HENT:     Head: Normocephalic and atraumatic.  Eyes:     Extraocular Movements: EOM normal.     Conjunctiva/sclera: Conjunctivae normal.  Cardiovascular:     Rate and Rhythm: Normal rate and regular rhythm.  Pulmonary:     Effort: Pulmonary effort is normal. No respiratory distress.     Breath sounds: Normal breath sounds. No stridor.  Abdominal:     General: There is no distension.     Tenderness: There is no abdominal tenderness. There is no guarding.  Musculoskeletal:        General: No edema.  Skin:    General: Skin is warm and dry.  Neurological:     Mental Status: She is alert and oriented to person, place, and time.     Cranial Nerves: No cranial nerve deficit or dysarthria.     Motor: No abnormal muscle tone.     Gait: Gait normal.  Psychiatric:        Mood and Affect: Mood and affect normal.     ED Results / Procedures / Treatments   Labs (all labs ordered are listed, but only abnormal results are displayed) Labs Reviewed - No data to display  EKG None  Radiology No results found.  Procedures Procedures   Medications Ordered in ED Medications  sodium chloride 0.9 % bolus 1,000 mL (has no administration in time range)  droperidol (INAPSINE) 2.5 MG/ML injection 1.25 mg (has no administration in time range)    ED Course  I have reviewed the triage vital signs and the nursing notes.  Pertinent labs & imaging results that were available during my care of the patient were reviewed by me and considered in my medical decision making (see chart for details).     Chart review after the initial visit notable for ED visit at our affiliated facility last week. Patient noted to have eloped  after not receiving narcotics.  After my initial evaluation, with no evidence  for new neurologic phenomena, no distress, no hemodynamic instability, the patient was written for fluids, droperidol. Her extensive allergy list precludes many other options. Given the absence of new neurologic phenomenon, distress, no advanced imaging indicated.  Patient's other complaints, which she notes, are chronic, seemingly unchanged.  Adult female with history of migraines presents with headache.  Patient has no new neurologic complaints, is awake, alert, oriented appropriately, ambulatory, without new red flags suggesting new processes. Patient required initial interventions here with fluids, medications, will require additional evaluation, may be appropriate for discharge.  Dr. Estell Harpin is aware of the patient.  Final Clinical Impression(s) / ED Diagnoses Final diagnoses:  Bad headache     Gerhard Munch, MD 11/14/20 1644

## 2020-11-14 NOTE — Telephone Encounter (Signed)
I called pt and she could not come in 0900 or 0930, she asked about tomorrow because of transportation. Then said anything this afternoon??  I relayed 1200.  She said ok.  Will see her then.

## 2020-11-16 LAB — HM MAMMOGRAPHY

## 2020-11-20 IMAGING — CT CT ABD-PELV W/O
2 of 4 series · 16 of 46 positions shown, 18 images · non-contrast
Comparison: 08/22/2018

CLINICAL DATA: Abdominal pain, diverticulitis suspected, left-sided
abdominal tenderness

EXAM:
CT ABDOMEN AND PELVIS WITHOUT CONTRAST
TECHNIQUE: Multidetector CT imaging of the abdomen and pelvis was performed
following the standard protocol without IV contrast. Oral enteric
contrast was administered.

[Series 2: axial st · axial · 0.88mm/px · z∈[-514,-89]mm · 13 of 96 slices shown, 15 images]
[im 6/96  soft-tissue]
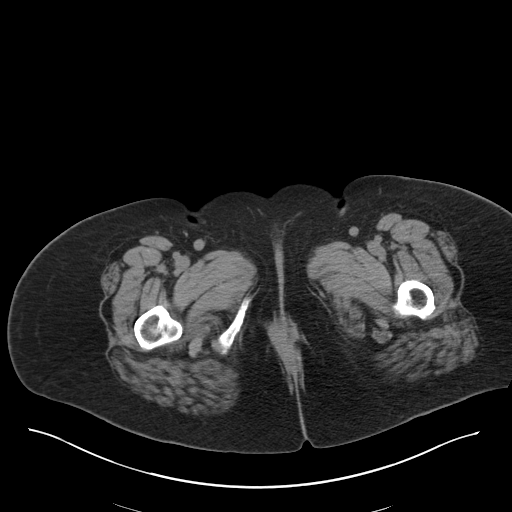
[im 6/96  bone]
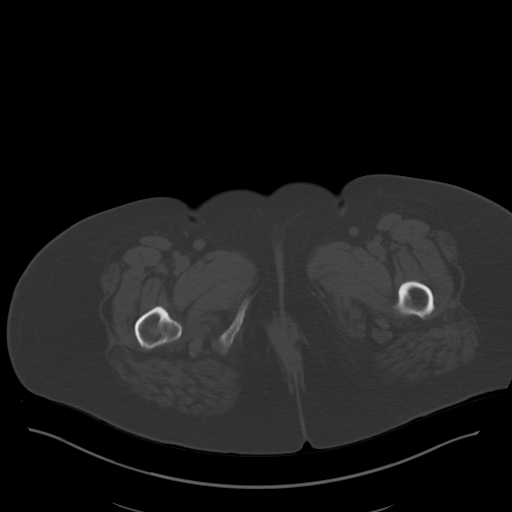
[im 16/96  soft-tissue]
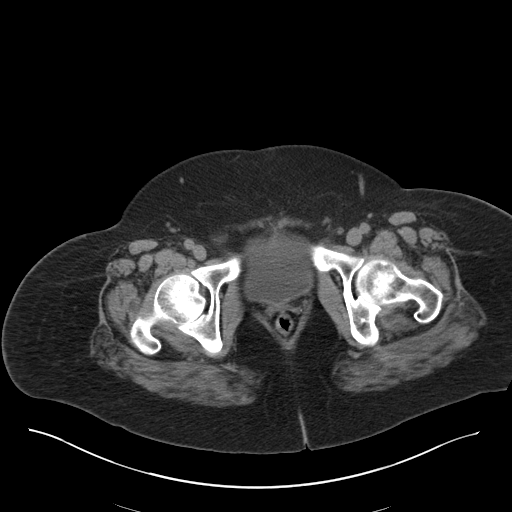
[im 21/96  soft-tissue]
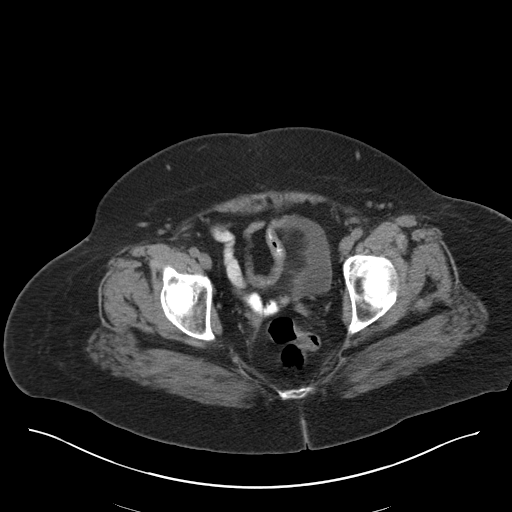
[im 26/96  soft-tissue]
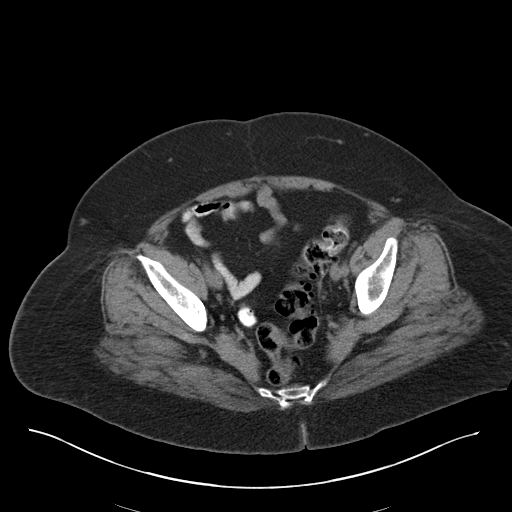
[im 36/96  soft-tissue]
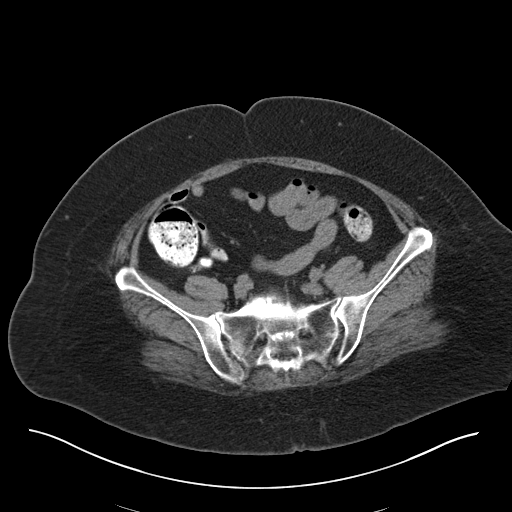
[im 41/96  soft-tissue]
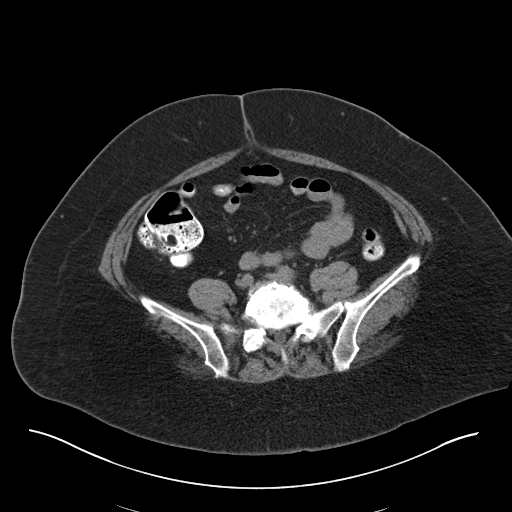
[im 51/96  soft-tissue]
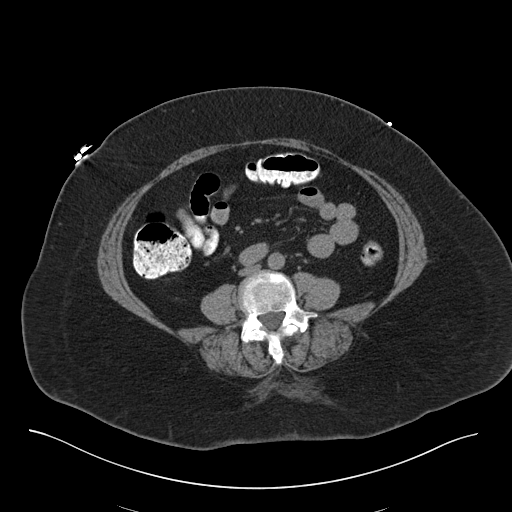
[im 56/96  soft-tissue]
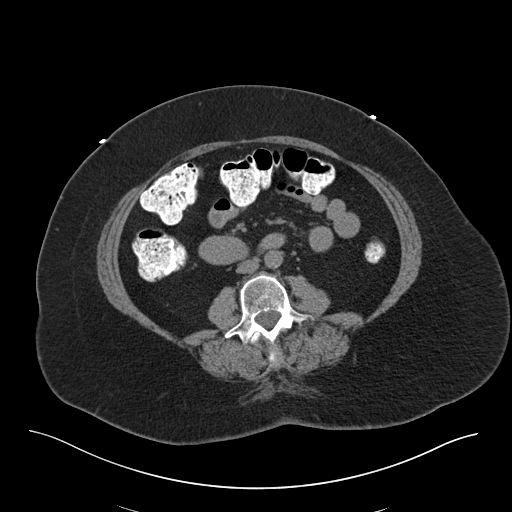
[im 61/96  soft-tissue]
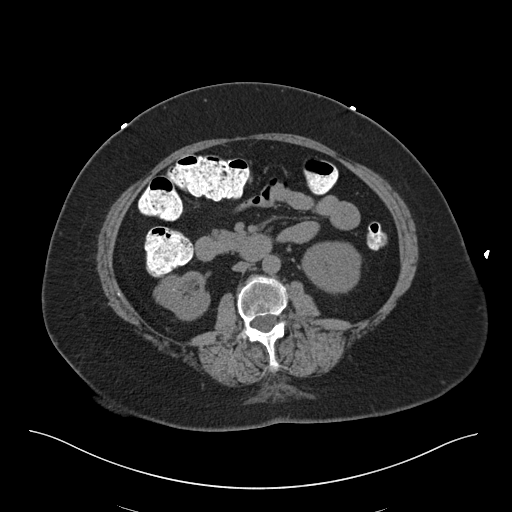
[im 61/96  bone]
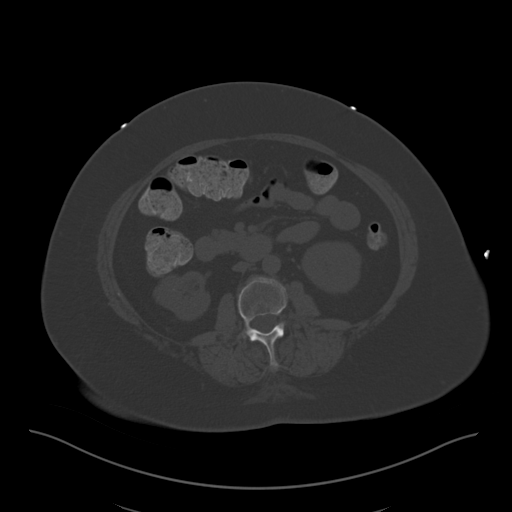
[im 71/96  soft-tissue]
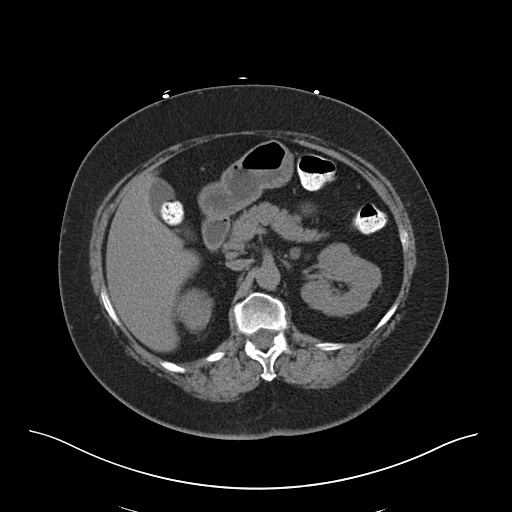
[im 76/96  soft-tissue]
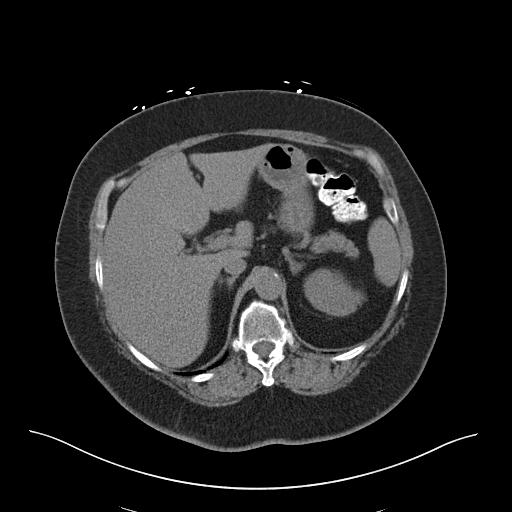
[im 81/96  soft-tissue]
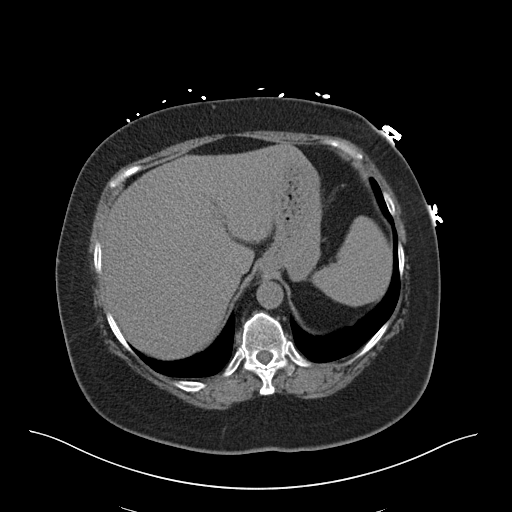
[im 91/96  soft-tissue]
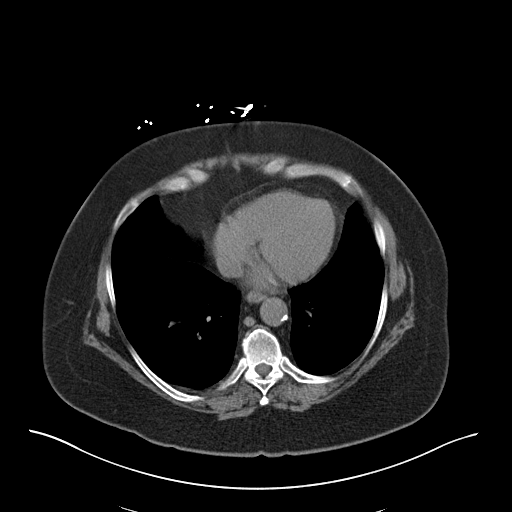

[Series 5: coronal st · coronal · 0.75mm/px · 3 of 161 slices shown]
[im 54/161  soft-tissue]
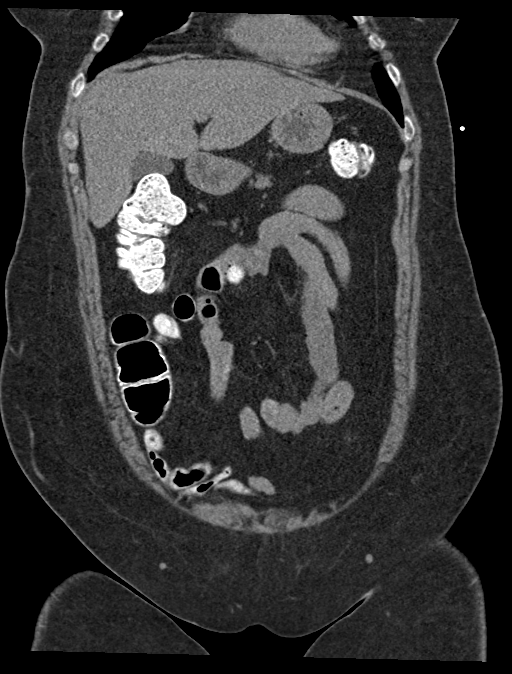
[im 72/161  soft-tissue]
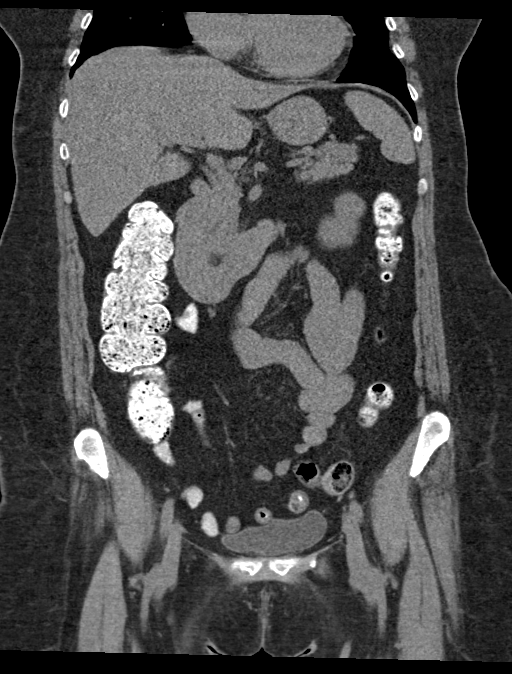
[im 89/161  soft-tissue]
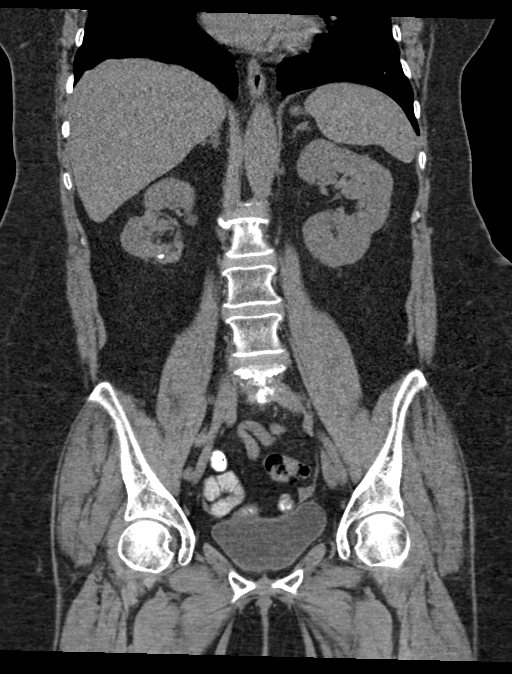

[16 of 46 positions shown; findings below may reference images not displayed]

FINDINGS: Lower chest: No acute abnormality.

Hepatobiliary: No focal liver abnormality is seen. No gallstones,
gallbladder wall thickening, or biliary dilatation.

Pancreas: Unremarkable. No pancreatic ductal dilatation or
surrounding inflammatory changes.

Spleen: Normal in size without focal abnormality.

Adrenals/Urinary Tract: Adrenal glands are unremarkable. Atrophic
appearing right kidney with small nonobstructive calculi. Bladder is
unremarkable.

Stomach/Bowel: Stomach is within normal limits. Appendix appears
normal. No evidence of bowel wall thickening, distention, or
inflammatory changes. Sigmoid diverticulosis without evidence of
acute diverticulitis.

Vascular/Lymphatic: No significant vascular findings are present. No
enlarged abdominal or pelvic lymph nodes.

Reproductive: No mass or other abnormality. Status post
hysterectomy.

Other: No abdominal wall hernia or abnormality. No abdominopelvic
ascites.

Musculoskeletal: No acute or significant osseous findings.
IMPRESSION: 1. Sigmoid diverticulosis without evidence of acute diverticulitis.
No acute non-contrast CT findings to explain left-sided abdominal
pain.

2.  Other chronic and incidental findings as above.

## 2020-11-27 ENCOUNTER — Ambulatory Visit: Payer: Medicaid Other | Admitting: *Deleted

## 2020-11-29 ENCOUNTER — Telehealth: Payer: Self-pay | Admitting: Adult Health

## 2020-11-29 NOTE — Telephone Encounter (Signed)
I called pt.  She has asking for [place] to prescribe her xanax since we are local.  She states it is helpful for her migraines.  She has gotten initially for anxiety, panic attacks and as noted helps her slight headaches.  (thru psychiatry).  She does VV via psych [facility] , gets urine at [facility].  She states that nurtec and ajovy is not helping.  From 4-5 migraines /month she now has 2-3/ month.  I relayed that is better.  Migraines wake her from sleep. , has vomiting, vision issues when has migraines (zofran not help, phenergan, makes her sleepy (not vomiting when asleep that is good).  Next appt 02-01-21).  I relayed that since her psych has been giving to her for anxiety/ panic they will need to continue to address.  Xanax is not something that we give for migraines.  Will forward message to MM/NP.

## 2020-11-29 NOTE — Telephone Encounter (Signed)
I agree I will not take over Xanax prescription.  Also agree that her migraines have in fact improved if she is now only having 2-3 a month.

## 2020-11-29 NOTE — Telephone Encounter (Signed)
Pt called wanting to know if provider can start prescribing her the alprazolam Stephanie [name]) 2 MG tablet that she has been taking due to them helping her migraines. Please advise.

## 2020-11-29 NOTE — Telephone Encounter (Signed)
I called pt and per MM/NP we will not take over her xanax prescription.  I relayed that if with anxiety we have pts see there pcp or psychiatry, psychologist, counselor.  She verbalized understanding.

## 2020-12-01 ENCOUNTER — Ambulatory Visit: Payer: Medicaid Other | Admitting: *Deleted

## 2020-12-11 ENCOUNTER — Telehealth: Payer: Self-pay

## 2020-12-11 NOTE — Telephone Encounter (Signed)
Patient left a message on the triage line stating that she was seen in the ER over the weekend for a UTI and she is concerned that she is on the wrong abx. She is not feeling any better, she is in a lot of pain which is causing her to walk with a cane.  Attempted to return patient's call no answer, could not leave vmail mailbox full -[facility] care everywhere lab results show urine cx grew mixed flora

## 2020-12-11 NOTE — Telephone Encounter (Signed)
Spoke with patient she states she has been vomiting and having terrible stomach and lower back pain. Patient states that [facility] ruled out a stone on CTscan and told her she has a UTI and gave ABX which she finished today. She was notified her that culture results from [facility] were negative for UTI, culture grew mixed flora (skin contamination). Patient denies fever and chills. She states her only urinary symptom is trouble emptying. She feels that she needs to go but has to strain to get a little out. She was offered an appointment for further evaluation of urinary symptoms, patient is not able to come until Friday afternoon due to transportation. She was told if her symptoms worsen or she is unable to urinate to go to the ED. She verbalized understanding. She was instructed to contact PCP in regards to back pain and stomach pain and vomiting for further evaluation. No urological reason found based off of [facility] findings.

## 2020-12-15 ENCOUNTER — Encounter: Payer: Self-pay | Admitting: Physician Assistant

## 2020-12-15 ENCOUNTER — Other Ambulatory Visit: Payer: Self-pay

## 2020-12-15 ENCOUNTER — Ambulatory Visit (INDEPENDENT_AMBULATORY_CARE_PROVIDER_SITE_OTHER): Payer: Medicaid Other | Admitting: Physician Assistant

## 2020-12-15 VITALS — BP 156/92 | HR 91 | Ht 62.0 in | Wt 210.0 lb

## 2020-12-15 DIAGNOSIS — R1031 Right lower quadrant pain: Secondary | ICD-10-CM | POA: Diagnosis not present

## 2020-12-15 DIAGNOSIS — N200 Calculus of kidney: Secondary | ICD-10-CM | POA: Diagnosis not present

## 2020-12-15 DIAGNOSIS — R39198 Other difficulties with micturition: Secondary | ICD-10-CM | POA: Diagnosis not present

## 2020-12-15 LAB — BLADDER SCAN AMB NON-IMAGING

## 2020-12-15 NOTE — Progress Notes (Signed)
12/15/2020 4:51 PM   Stephanie [name] 07-Feb-1966 [id number]  CC: Chief Complaint  Patient presents with  . Urinary Retention    HPI: Stephanie [name] is a 55 y.o. female with PMH recurrent nephrolithiasis previously managed by Dr. Marcy Salvo at [facility] who presents today for evaluation of RLQ pain and difficulty urinating.  She was seen in the [facility] emergency department 12 days ago for evaluation of the same abdominal pain.  CT stone study was performed at that time which revealed no obstructing urolithiasis.  She was treated with IV Rocephin in the emergency department and discharged on a 5-day course of Macrobid for treatment of possible UTI.  Her urine culture ultimately resulted with mixed urogenital flora.  Today she reports ongoing RLQ pain and hip pain that has been so severe that she has required a cane for ambulation this week.  She states she initially felt she was not voiding well, however this has resolved.  She completed her prescribed course of Macrobid.  I personally reviewed her abdominal CT from [facility], which reveals bilateral parenchymal calcifications with no evidence of obstructing urolithiasis.  Patient denies sick contacts and is up-to-date on Covid vaccine and boosters.  She denies GI upset including constipation or diarrhea.  She states she wishes to transfer her urologic care to our practice and says she has not seen [facility] urology for 8 years, however on chart review she underwent right ureteroscopy with Dr. Marcy Salvo in July 2020.  In-office UA today positive for 1+ leukocyte esterase; urine microscopy with 6-10 WBCs/HPF and >10 epithelial cells/hpf. PVR 49mL.  PMH: Past Medical History:  Diagnosis Date  . Diabetes mellitus without complication (HCC)   . Difficult intubation   . Diverticulitis   . Headache   . Hepatitis C    treated and resolved  . History of kidney stones   . Hypertension   . IBS (irritable bowel syndrome)   . Kidney stones   . Sciatica   .  Sleep apnea    on cpap  . Stroke [facility]) 2017   memory loss    Surgical History: Past Surgical History:  Procedure Laterality Date  . ABDOMINAL HYSTERECTOMY    . carpel tunnel syndrome  2017  . HARDWARE REMOVAL Right 12/22/2017   Procedure: HARDWARE REMOVAL-RIGHT ARM;  Surgeon: Lyndle Herrlich, MD;  Location: [facility] ORS;  Service: Orthopedics;  Laterality: Right;  Removal of implant, superficial right humerus  . HUMERUS IM NAIL Right 03/11/2017   Procedure: INTRAMEDULLARY (IM) NAIL HUMERAL;  Surgeon: Lyndle Herrlich, MD;  Location: [facility] ORS;  Service: Orthopedics;  Laterality: Right;  . KIDNEY STONE SURGERY Right 02/12/12  . KIDNEY STONE SURGERY Left 07/15/2012  . LIGATION OF ARTERIOVENOUS  FISTULA Left 06/21/2016   Procedure: LIGATION OF ARTERIOVENOUS  FISTULA ( LIGATION BASILIC VEIN );  Surgeon: Renford Dills, MD;  Location: [facility] ORS;  Service: Vascular;  Laterality: Left;  . LITHOTRIPSY    . OOPHORECTOMY    . SINUS EXPLORATION    . TONSILLECTOMY      Home Medications:  Allergies as of 12/15/2020      Reactions   Gabapentin Itching   Ibuprofen Itching   Tylenol [acetaminophen] Itching   Aspirin Itching   Buprenorphine Hcl Itching   Carbamazepine Itching, Other (See Comments), Rash   Makes her feel like something is crawling under her skin.   Codeine Itching, Other (See Comments)   Makes her feel like something is crawling under her skin. Can take, sometimes  makes her itch   Compazine Itching, Other (See Comments)   "makes my skin crawl"   Elemental Sulfur Hives, Rash, Other (See Comments)   Skin Rashes, Hives   Ioxaglate Itching   Ivp Dye [iodinated Diagnostic Agents] Itching, Other (See Comments)   Makes her itch really bad. Had to get 2-3 shots of Benadryl when she was in the hospital.   Metrizamide Itching   Naproxen Itching   Norco [hydrocodone-acetaminophen] Itching   Other Itching, Other (See Comments)   Makes her itch really bad. Had to get 2-3 shots of Benadryl  when she was in the hospital.   Penicillin G Hives, Rash, Other (See Comments)   Has patient had a PCN reaction causing immediate rash, facial/tongue/throat swelling, SOB or lightheadedness with hypotension: Yes Has patient had a PCN reaction causing severe rash involving mucus membranes or skin necrosis: No Has patient had a PCN reaction that required hospitalization: No Has patient had a PCN reaction occurring within the last 10 years: No If all of the above answers are "NO", then may proceed with Cephalosporin use.   Prochlorperazine Maleate Other (See Comments)   Compazine makes her skin crawl - needs 2-3 shots of Benadryl to get relief.   Reglan [metoclopramide] Itching, Other (See Comments)   Makes her skin crawl   Toradol [ketorolac Tromethamine] Itching   Tramadol Itching   Zofran [ondansetron Hcl] Itching      Medication List       Accurate as of December 15, 2020  4:51 PM. If you have any questions, ask your nurse or doctor.        Ajovy 225 MG/1.5ML Soaj Generic drug: Fremanezumab-vfrm Inject 225 mg into the skin every 30 (thirty) days.   alprazolam 2 MG tablet Commonly known as: XANAX Take 2 mg by mouth 2 (two) times daily as needed for anxiety.   ARIPiprazole 5 MG tablet Commonly known as: ABILIFY Take 5 mg by mouth daily.   budesonide-formoterol 160-4.5 MCG/ACT inhaler Commonly known as: SYMBICORT Inhale 2 puffs into the lungs 2 (two) times daily.   chlorthalidone 25 MG tablet Commonly known as: HYGROTON Take 25 mg by mouth daily.   cholecalciferol 25 MCG (1000 UNIT) tablet Commonly known as: VITAMIN D3 Take 1,000 Units by mouth daily.   cloNIDine 0.2 MG tablet Commonly known as: CATAPRES Take 0.2 mg by mouth 2 (two) times daily.   clopidogrel 75 MG tablet Commonly known as: PLAVIX Take 1 tablet (75 mg total) by mouth daily.   etodolac 300 MG capsule Commonly known as: LODINE Take 1 capsule (300 mg total) by mouth 3 (three) times daily as needed  (pain).   Farxiga 10 MG Tabs tablet Generic drug: dapagliflozin propanediol Take 10 mg by mouth daily.   FLUoxetine 40 MG capsule Commonly known as: PROZAC Take 40 mg by mouth daily.   glipiZIDE 5 MG tablet Commonly known as: GLUCOTROL Take 1 tablet (5 mg total) by mouth 2 (two) times daily before a meal.   hydrALAZINE 100 MG tablet Commonly known as: APRESOLINE Take 100 mg by mouth 3 (three) times daily.   hydrOXYzine 25 MG tablet Commonly known as: ATARAX/VISTARIL Take 25 mg by mouth daily.   lamoTRIgine 100 MG tablet Commonly known as: LaMICtal Take 1 tablet (100 mg total) by mouth 2 (two) times daily.   levETIRAcetam 750 MG tablet Commonly known as: KEPPRA Take 1 tablet (750 mg total) by mouth 2 (two) times daily.   losartan 50 MG tablet Commonly known as:  COZAAR Take 50 mg by mouth daily.   Nurtec 75 MG Tbdp Generic drug: Rimegepant Sulfate Take 1 tablet daily onset of migraine.   omeprazole 40 MG capsule Commonly known as: PRILOSEC Take 40 mg by mouth daily as needed for heartburn.   predniSONE 10 MG tablet Commonly known as: DELTASONE Take 4O mg with food once a day po for 3 days , then 20 mg for 3 days   promethazine 25 MG tablet Commonly known as: PHENERGAN Take 1 tablet (25 mg total) by mouth every 6 (six) hours as needed for nausea or vomiting.   traZODone 150 MG tablet Commonly known as: DESYREL Take 150 mg by mouth at bedtime as needed for sleep.   Trintellix 10 MG Tabs tablet Generic drug: vortioxetine HBr Take 10 mg by mouth daily.   ZYRTEC PO Take by mouth.   cetirizine 10 MG tablet Commonly known as: ZYRTEC Take 10 mg by mouth daily.       Allergies:  Allergies  Allergen Reactions  . Gabapentin Itching  . Ibuprofen Itching  . Tylenol [Acetaminophen] Itching  . Aspirin Itching  . Buprenorphine Hcl Itching  . Carbamazepine Itching, Other (See Comments) and Rash    Makes her feel like something is crawling under her skin.  .  Codeine Itching and Other (See Comments)    Makes her feel like something is crawling under her skin. Can take, sometimes makes her itch  . Compazine Itching and Other (See Comments)    "makes my skin crawl"  . Elemental Sulfur Hives, Rash and Other (See Comments)    Skin Rashes, Hives  . Ioxaglate Itching  . Ivp Dye [Iodinated Diagnostic Agents] Itching and Other (See Comments)    Makes her itch really bad. Had to get 2-3 shots of Benadryl when she was in the hospital.  . Metrizamide Itching  . Naproxen Itching  . Norco [Hydrocodone-Acetaminophen] Itching  . Other Itching and Other (See Comments)    Makes her itch really bad. Had to get 2-3 shots of Benadryl when she was in the hospital.  . Penicillin G Hives, Rash and Other (See Comments)    Has patient had a PCN reaction causing immediate rash, facial/tongue/throat swelling, SOB or lightheadedness with hypotension: Yes Has patient had a PCN reaction causing severe rash involving mucus membranes or skin necrosis: No Has patient had a PCN reaction that required hospitalization: No Has patient had a PCN reaction occurring within the last 10 years: No If all of the above answers are "NO", then may proceed with Cephalosporin use.   Marland Kitchen. Prochlorperazine Maleate Other (See Comments)    Compazine makes her skin crawl - needs 2-3 shots of Benadryl to get relief.  . Reglan [Metoclopramide] Itching and Other (See Comments)    Makes her skin crawl  . Toradol [Ketorolac Tromethamine] Itching  . Tramadol Itching  . Zofran [Ondansetron Hcl] Itching    Family History: Family History  Problem Relation Age of Onset  . Heart failure Mother   . Diabetes Father   . Cancer Sister     Social History:   reports that she has been smoking cigarettes. She has a 1.50 pack-year smoking history. She has never used smokeless tobacco. She reports previous alcohol use. She reports that she does not use drugs.  Physical Exam: BP (!) 156/92   Pulse 91    Ht 5\' 2"  (1.575 m)   Wt 210 lb (95.3 kg)   BMI 38.41 kg/m   Constitutional:  Alert  and oriented, no acute distress, nontoxic appearing HEENT: [place], AT Cardiovascular: No clubbing, cyanosis, or edema Respiratory: Normal respiratory effort, no increased work of breathing Skin: No rashes, bruises or suspicious lesions Neurologic: Grossly intact, no focal deficits, moving all 4 extremities Psychiatric: Normal mood and affect  Laboratory Data: Results for orders placed or performed in visit on 12/15/20  Microscopic Examination   Urine  Result Value Ref Range   WBC, UA 6-10 (A) 0 - 5 /hpf   RBC 0-2 0 - 2 /hpf   Epithelial Cells (non renal) >10 (A) 0 - 10 /hpf   Renal Epithel, UA 0-10 (A) None seen /hpf   Bacteria, UA None seen None seen/Few  Urinalysis, Complete  Result Value Ref Range   Specific Gravity, UA 1.015 1.005 - 1.030   pH, UA 6.5 5.0 - 7.5   Color, UA Yellow Yellow   Appearance Ur Hazy (A) Clear   Leukocytes,UA 1+ (A) Negative   Protein,UA Negative Negative/Trace   Glucose, UA Negative Negative   Ketones, UA Negative Negative   RBC, UA Negative Negative   Bilirubin, UA Negative Negative   Urobilinogen, Ur 0.2 0.2 - 1.0 mg/dL   Nitrite, UA Negative Negative   Microscopic Examination See below:   Bladder Scan (Post Void Residual) in office  Result Value Ref Range   Scan Result 35mL    Assessment & Plan:   1. Right lower quadrant abdominal pain UA benign with negative culture last week at [facility].  No evidence of obstructive urolithiasis on recent CT and no acute symptomatic changes since this testing was performed.  I do not believe her symptoms are urologic in nature and encouraged her to follow-up with her PCP for further evaluation.  She expressed understanding. - Urinalysis, Complete  2. Difficulty urinating PVR WNL, no further intervention indicated. - Bladder Scan (Post Void Residual) in office   Return if symptoms worsen or fail to improve.  Carman Ching, PA-C  [facility] [address] [place], [place] [address] [phone]

## 2020-12-16 LAB — URINALYSIS, COMPLETE
Bilirubin, UA: NEGATIVE
Glucose, UA: NEGATIVE
Ketones, UA: NEGATIVE
Nitrite, UA: NEGATIVE
Protein,UA: NEGATIVE
RBC, UA: NEGATIVE
Specific Gravity, UA: 1.015 (ref 1.005–1.030)
Urobilinogen, Ur: 0.2 mg/dL (ref 0.2–1.0)
pH, UA: 6.5 (ref 5.0–7.5)

## 2020-12-16 LAB — MICROSCOPIC EXAMINATION
Bacteria, UA: NONE SEEN
Epithelial Cells (non renal): >10 /hpf — AB (ref 0–10)

## 2020-12-20 IMAGING — CT CT ABDOMEN AND PELVIS WITHOUT CONTRAST
2 of 4 series · 16 of 46 positions shown, 18 images · non-contrast
Comparison: 11/18/2018

CLINICAL DATA: Nausea, vomiting, diarrhea, and generalized
abdominal pain for 4 days.

EXAM:
CT ABDOMEN AND PELVIS WITHOUT CONTRAST
TECHNIQUE: Multidetector CT imaging of the abdomen and pelvis was performed
following the standard protocol without IV contrast.

[Series 2: routine abd/pel wo · axial · 0.74mm/px · z∈[-549,-129]mm · 13 of 92 slices shown, 15 images]
[im 4/92  soft-tissue]
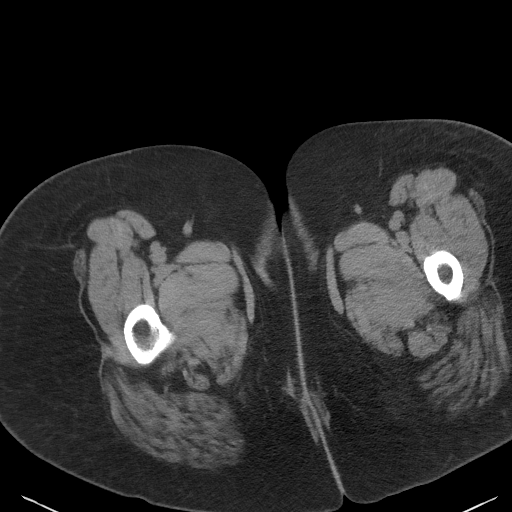
[im 4/92  bone]
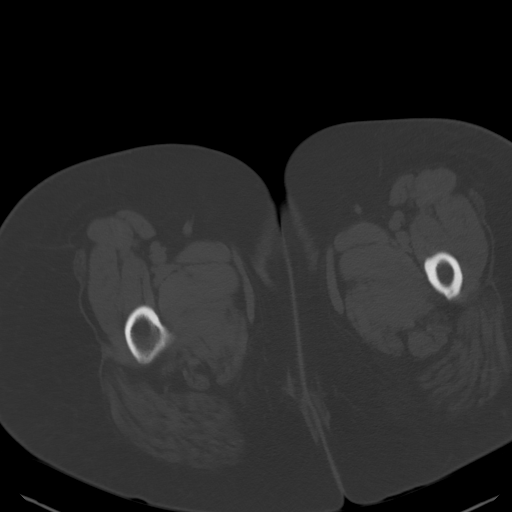
[im 12/92  soft-tissue]
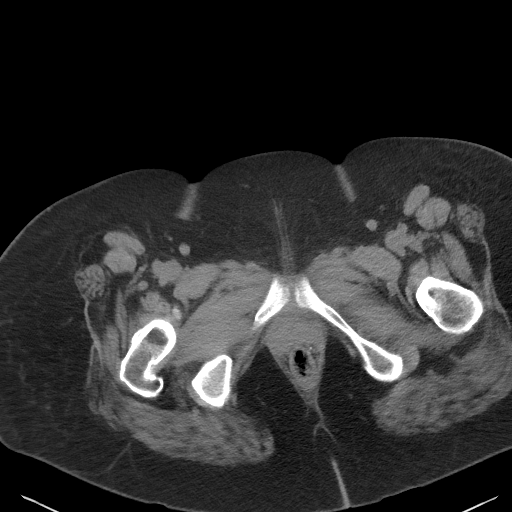
[im 20/92  soft-tissue]
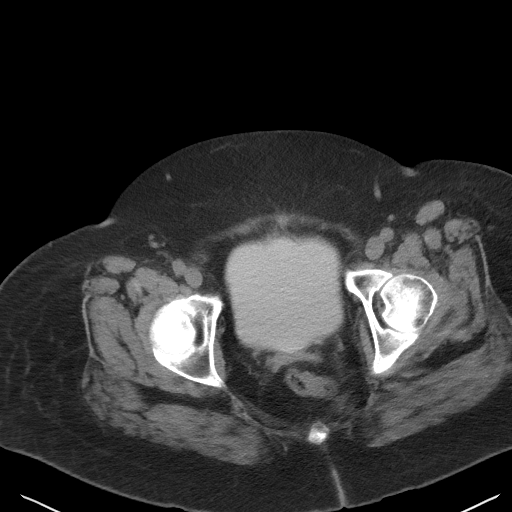
[im 24/92  soft-tissue]
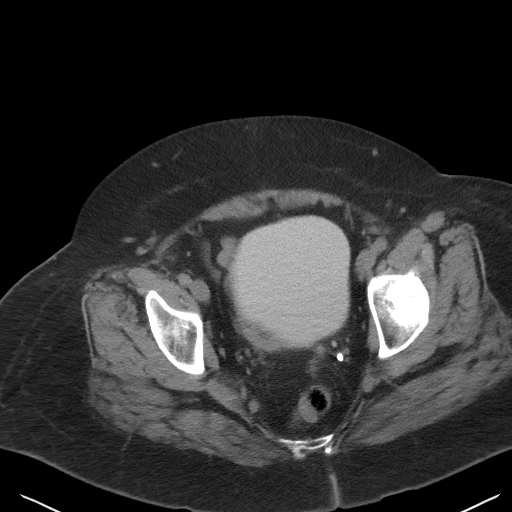
[im 32/92  soft-tissue]
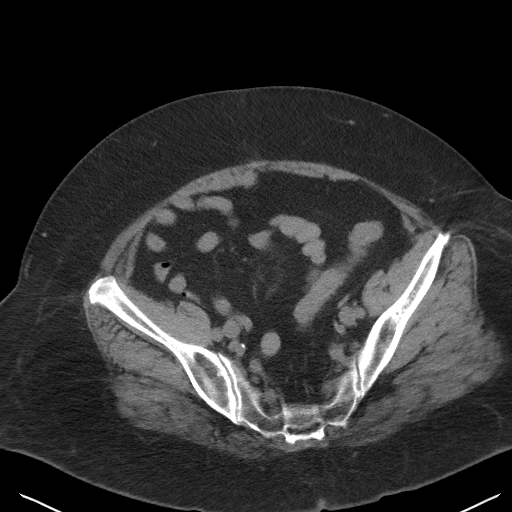
[im 40/92  soft-tissue]
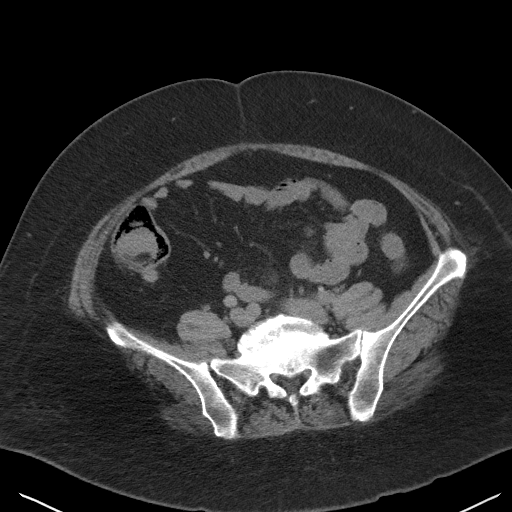
[im 48/92  soft-tissue]
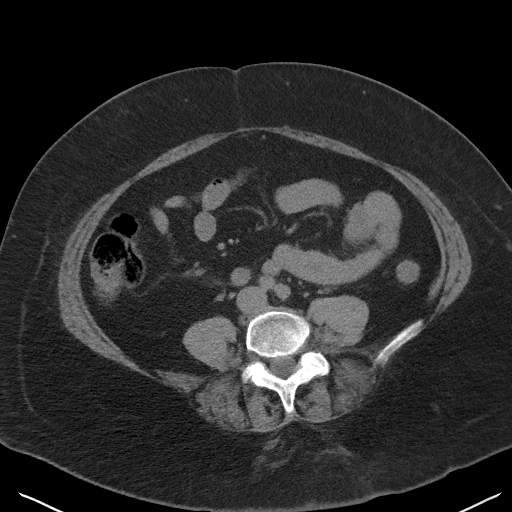
[im 52/92  soft-tissue]
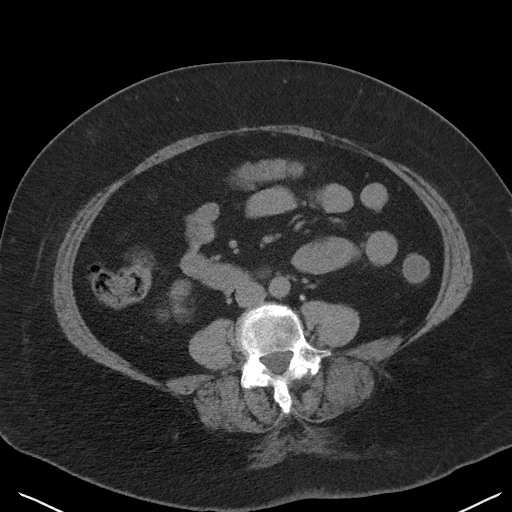
[im 60/92  soft-tissue]
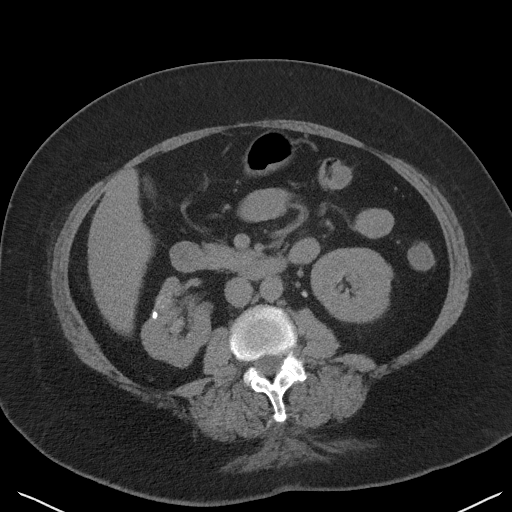
[im 60/92  bone]
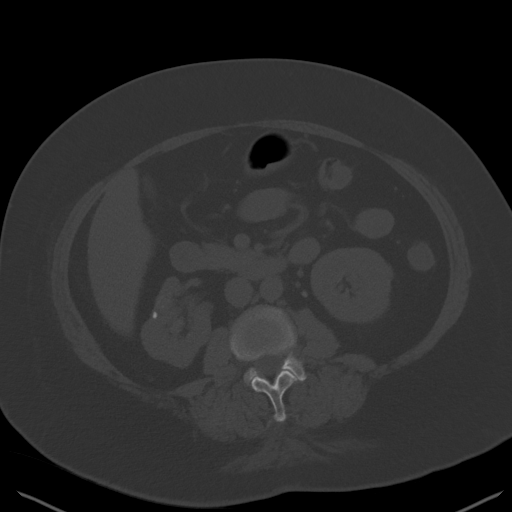
[im 68/92  soft-tissue]
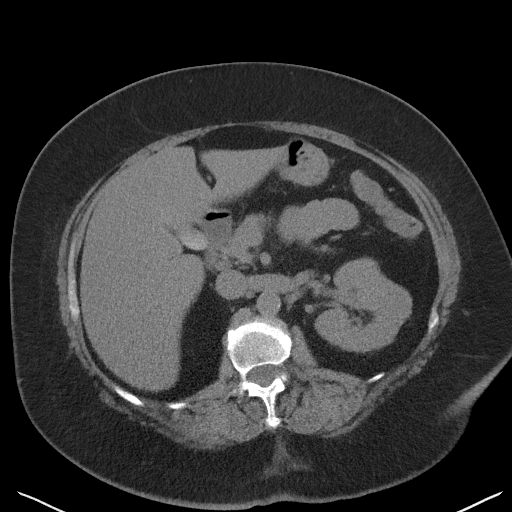
[im 72/92  soft-tissue]
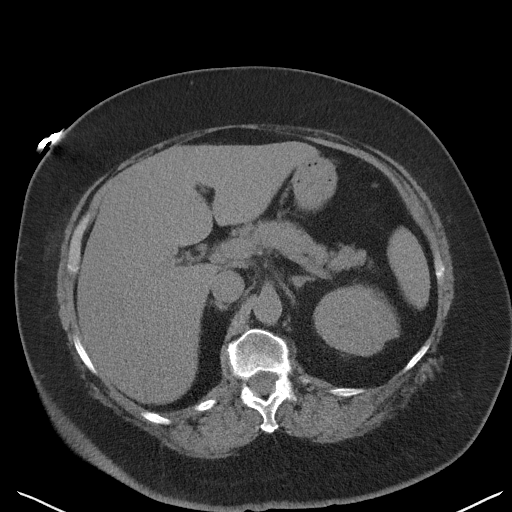
[im 80/92  soft-tissue]
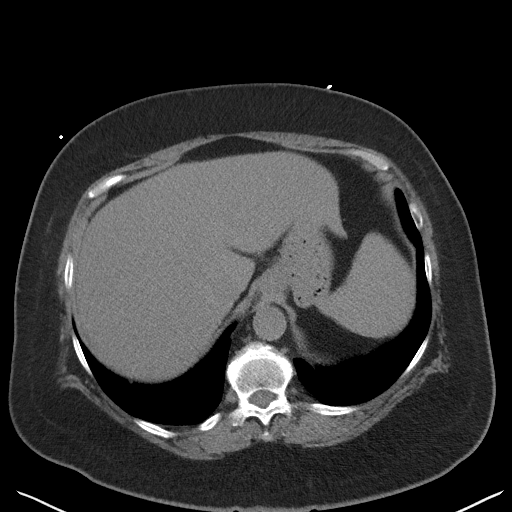
[im 88/92  soft-tissue]
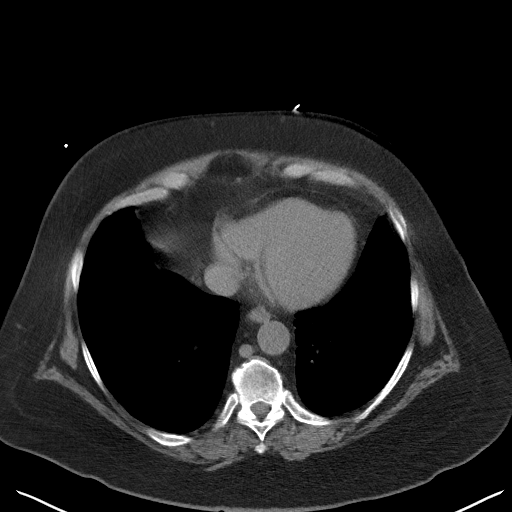

[Series 5: coronal st · coronal · 0.86mm/px · 3 of 101 slices shown]
[im 34/101  soft-tissue]
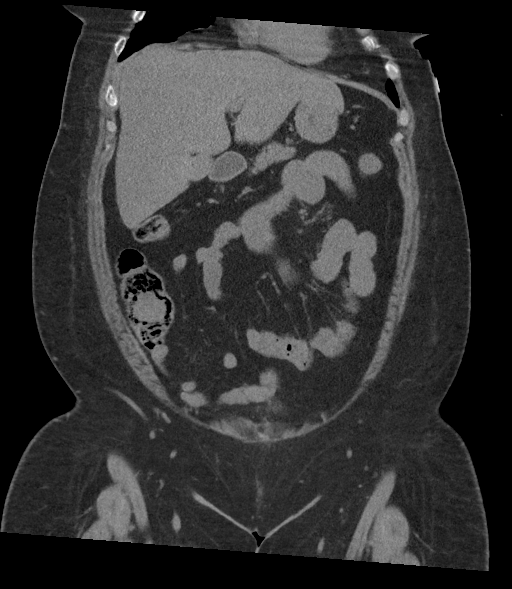
[im 45/101  soft-tissue]
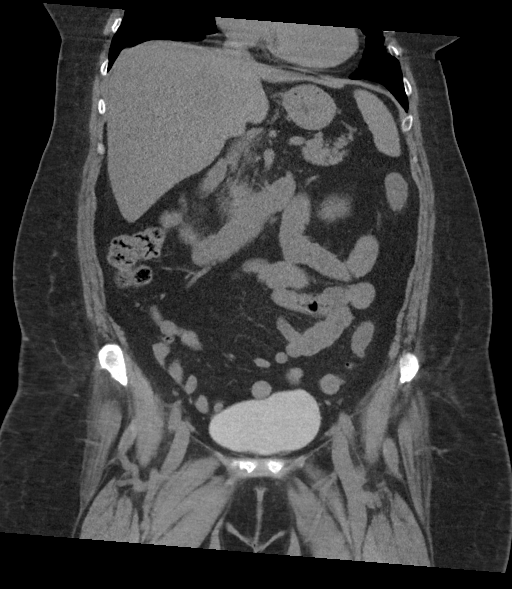
[im 56/101  soft-tissue]
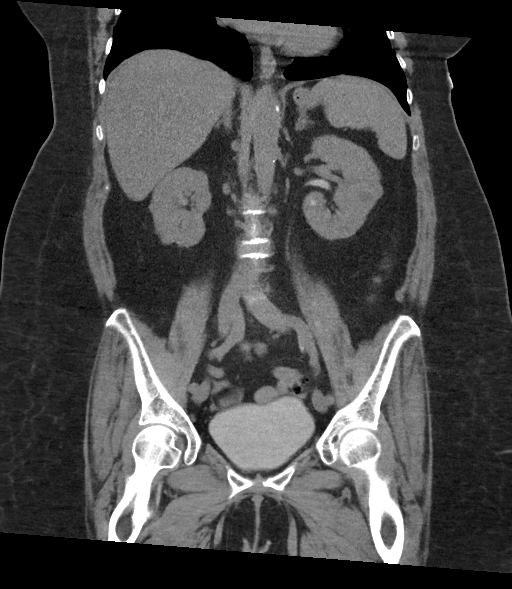

[16 of 46 positions shown; findings below may reference images not displayed]

FINDINGS: Lower chest: Lung bases are clear.

Hepatobiliary: No focal liver lesions. Increased density in the
gallbladder likely represents vicarious excretion of contrast
material period. No gallbladder wall thickening or edema. No bile
duct dilatation.

Pancreas: Unremarkable. No pancreatic ductal dilatation or
surrounding inflammatory changes.

Spleen: Normal in size without focal abnormality.

Adrenals/Urinary Tract: No adrenal gland nodules. Multiple stones in
the right kidney and a single stone in the left kidney. Largest
right renal stone measures 4.5 mm in diameter. Residual contrast
material in the bladder, renal collecting system and ureters.
Parenchymal scarring on both kidneys, greater on the right. No
hydronephrosis or hydroureter. Bladder wall is not thickened and no
filling defects are demonstrated.

Stomach/Bowel: Stomach, small bowel, and colon are mostly
decompressed with scattered stool in the colon. No wall thickening
or inflammatory changes. Scattered diverticula in the sigmoid colon
without evidence of diverticulitis. Appendix is normal.

Vascular/Lymphatic: Aortic atherosclerosis. No enlarged abdominal or
pelvic lymph nodes.

Reproductive: Status post hysterectomy. No adnexal masses.

Other: No free air or free fluid in the abdomen. Abdominal wall
musculature appears intact.

Musculoskeletal: Degenerative changes in the spine. No destructive
bone lesions.
IMPRESSION: Bilateral nonobstructing intrarenal stones. Scarring in both
kidneys. No hydronephrosis or hydroureter. No evidence of bowel
obstruction or inflammation.

## 2021-01-08 ENCOUNTER — Other Ambulatory Visit: Payer: Self-pay | Admitting: Neurology

## 2021-01-29 ENCOUNTER — Telehealth: Payer: Medicaid Other | Admitting: Adult Health

## 2021-02-01 ENCOUNTER — Ambulatory Visit: Payer: Medicaid Other | Admitting: Adult Health

## 2021-02-06 ENCOUNTER — Ambulatory Visit: Payer: Medicaid Other | Admitting: Adult Health

## 2021-02-15 ENCOUNTER — Telehealth: Payer: Self-pay | Admitting: Adult Health

## 2021-02-15 ENCOUNTER — Ambulatory Visit: Payer: Medicaid Other | Admitting: Adult Health

## 2021-02-15 ENCOUNTER — Encounter: Payer: Self-pay | Admitting: Adult Health

## 2021-02-15 IMAGING — DX PORTABLE CHEST - 1 VIEW
1 series · 1 of 1 positions shown · non-contrast
Comparison: 11/18/2018

CLINICAL DATA: Pt returned today with same symptoms as seen for on
01/12/19. Pt reported she continues to have right flank pain. Pt
stated she had a CT which she states revealed kidney and gall
bladder stones.chest pain

EXAM:
PORTABLE CHEST 1 VIEW

[chest ap]
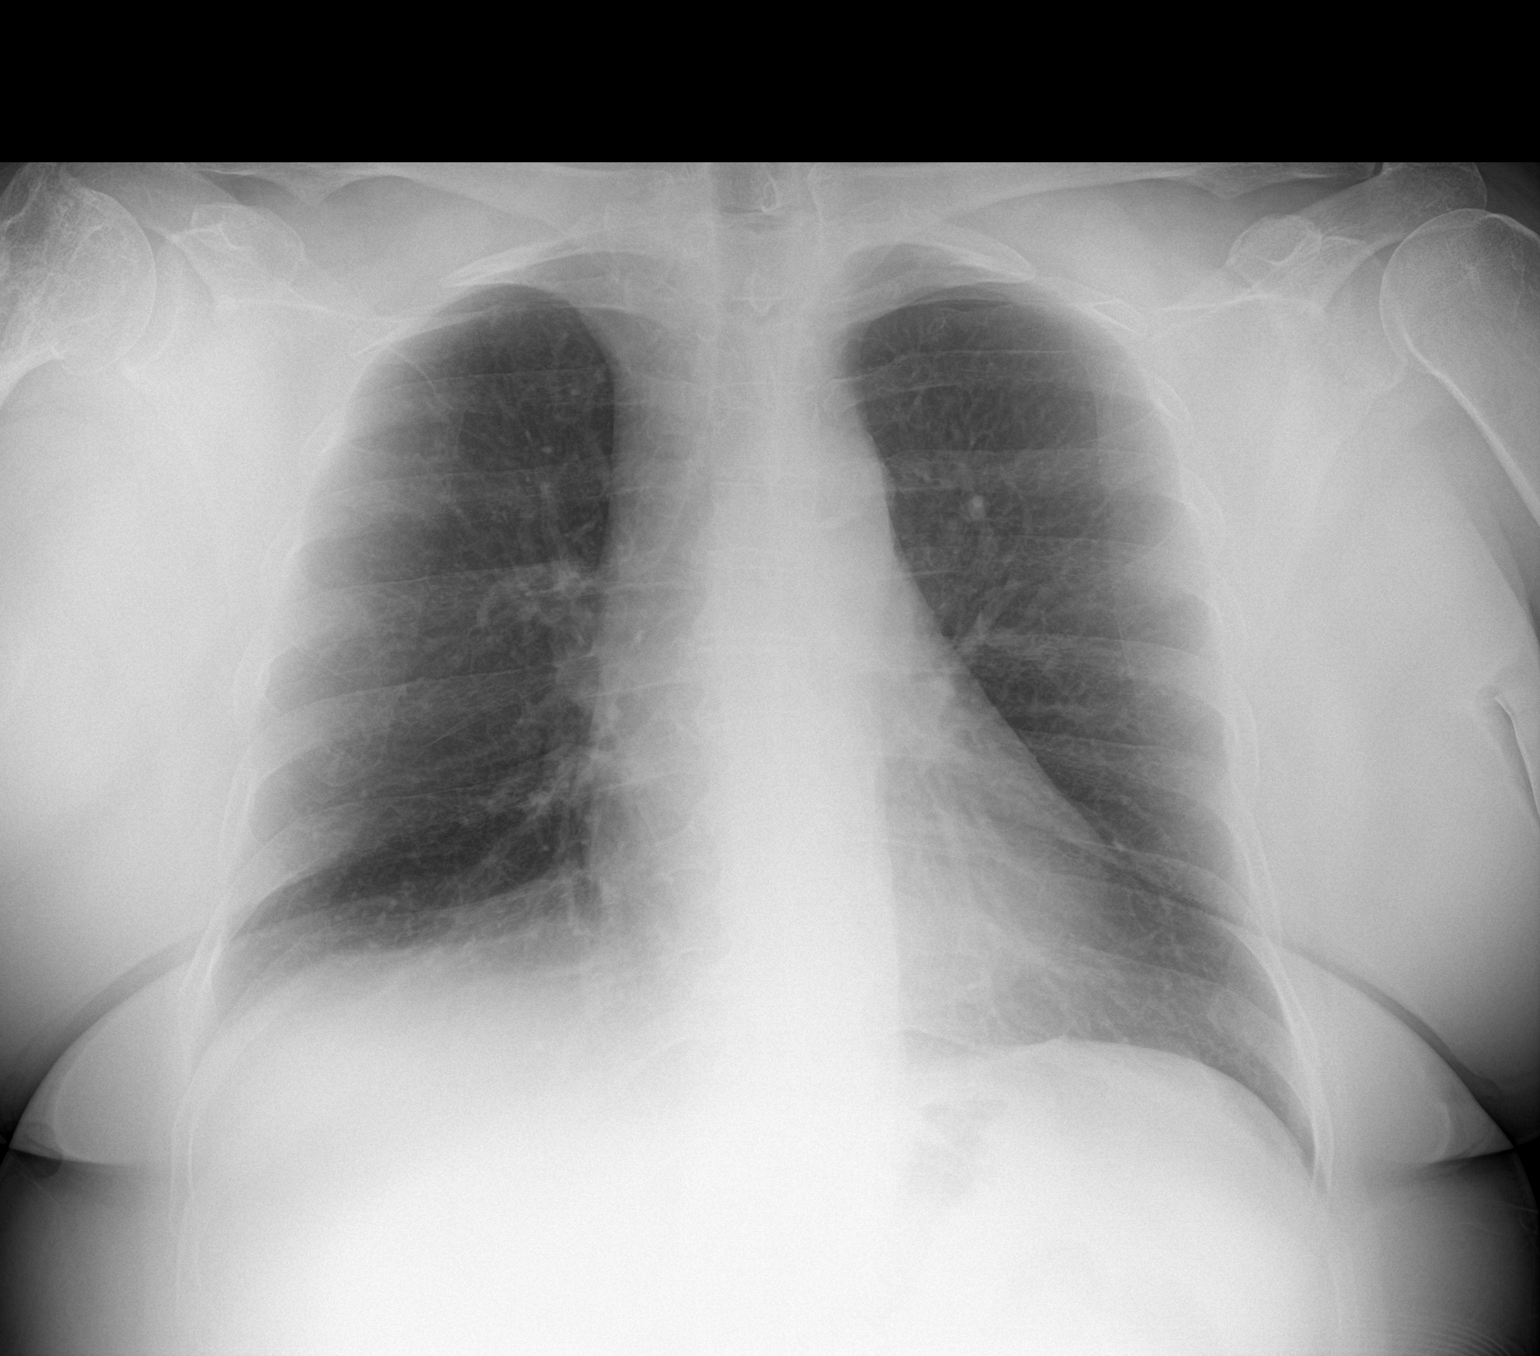

[1 of 1 positions shown; findings below may reference images not displayed]

FINDINGS: Normal mediastinum and cardiac silhouette. Normal pulmonary
vasculature. No evidence of effusion, infiltrate, or pneumothorax.
No acute bony abnormality.
IMPRESSION: No acute cardiopulmonary process.

## 2021-02-15 NOTE — Telephone Encounter (Signed)
Patient no showed appt 08/31/20, 01/29/21 and today 02/15/21. Per policy please dismiss from our practice.

## 2021-02-18 ENCOUNTER — Other Ambulatory Visit: Payer: Self-pay

## 2021-02-18 ENCOUNTER — Emergency Department (HOSPITAL_COMMUNITY): Payer: Medicaid Other

## 2021-02-18 ENCOUNTER — Emergency Department (HOSPITAL_COMMUNITY)
Admission: EM | Admit: 2021-02-18 | Discharge: 2021-02-18 | Disposition: A | Discharge status: Home / Self Care | Payer: Medicaid Other | Attending: Emergency Medicine | Admitting: Emergency Medicine

## 2021-02-18 ENCOUNTER — Encounter (HOSPITAL_COMMUNITY): Payer: Self-pay | Admitting: Emergency Medicine

## 2021-02-18 DIAGNOSIS — Z7902 Long term (current) use of antithrombotics/antiplatelets: Secondary | ICD-10-CM | POA: Diagnosis not present

## 2021-02-18 DIAGNOSIS — F17210 Nicotine dependence, cigarettes, uncomplicated: Secondary | ICD-10-CM | POA: Insufficient documentation

## 2021-02-18 DIAGNOSIS — M25561 Pain in right knee: Secondary | ICD-10-CM | POA: Diagnosis present

## 2021-02-18 DIAGNOSIS — R739 Hyperglycemia, unspecified: Secondary | ICD-10-CM

## 2021-02-18 DIAGNOSIS — I10 Essential (primary) hypertension: Secondary | ICD-10-CM | POA: Insufficient documentation

## 2021-02-18 DIAGNOSIS — E1165 Type 2 diabetes mellitus with hyperglycemia: Secondary | ICD-10-CM | POA: Insufficient documentation

## 2021-02-18 DIAGNOSIS — Z79899 Other long term (current) drug therapy: Secondary | ICD-10-CM | POA: Insufficient documentation

## 2021-02-18 DIAGNOSIS — G8929 Other chronic pain: Secondary | ICD-10-CM | POA: Diagnosis not present

## 2021-02-18 DIAGNOSIS — Z7984 Long term (current) use of oral hypoglycemic drugs: Secondary | ICD-10-CM | POA: Diagnosis not present

## 2021-02-18 DIAGNOSIS — M545 Low back pain, unspecified: Secondary | ICD-10-CM | POA: Diagnosis not present

## 2021-02-18 LAB — CBC
HCT: 39.8 % (ref 36.0–46.0)
Hemoglobin: 12.6 g/dL (ref 12.0–15.0)
MCH: 28.8 pg (ref 26.0–34.0)
MCHC: 31.7 g/dL (ref 30.0–36.0)
MCV: 91.1 fL (ref 80.0–100.0)
Platelets: 152 10*3/uL (ref 150–400)
RBC: 4.37 MIL/uL (ref 3.87–5.11)
RDW: 13.7 % (ref 11.5–15.5)
WBC: 6.5 10*3/uL (ref 4.0–10.5)
nRBC: 0 % (ref 0.0–0.2)

## 2021-02-18 LAB — BASIC METABOLIC PANEL
Anion gap: 8 (ref 5–15)
BUN: 18 mg/dL (ref 6–20)
CO2: 30 mmol/L (ref 22–32)
Calcium: 9.8 mg/dL (ref 8.9–10.3)
Chloride: 102 mmol/L (ref 98–111)
Creatinine, Ser: 1.02 mg/dL — ABNORMAL HIGH (ref 0.44–1.00)
GFR, Estimated: >60 mL/min (ref >60)
Glucose, Bld: 298 mg/dL — ABNORMAL HIGH (ref 70–99)
Potassium: 4.5 mmol/L (ref 3.5–5.1)
Sodium: 140 mmol/L (ref 135–145)

## 2021-02-18 LAB — URINALYSIS, ROUTINE W REFLEX MICROSCOPIC
Bilirubin Urine: NEGATIVE
Glucose, UA: >=500 mg/dL — AB
Ketones, ur: NEGATIVE mg/dL
Nitrite: NEGATIVE
Protein, ur: NEGATIVE mg/dL
Specific Gravity, Urine: 1.023 (ref 1.005–1.030)
pH: 7 (ref 5.0–8.0)

## 2021-02-18 LAB — CBG MONITORING, ED
Glucose-Capillary: 358 mg/dL — ABNORMAL HIGH (ref 70–99)
Glucose-Capillary: 293 mg/dL — ABNORMAL HIGH (ref 70–99)
Glucose-Capillary: 199 mg/dL — ABNORMAL HIGH (ref 70–99)

## 2021-02-18 MED ORDER — MORPHINE SULFATE (PF) 2 MG/ML IV SOLN
2.0000 mg | Freq: Once | INTRAVENOUS | Status: AC
  Administered 2021-02-18: 2 mg via INTRAMUSCULAR
  Filled 2021-02-18: qty 1

## 2021-02-18 MED ORDER — MORPHINE SULFATE (PF) 4 MG/ML IV SOLN
4.0000 mg | Freq: Once | INTRAVENOUS | Status: AC
  Administered 2021-02-18: 4 mg via INTRAMUSCULAR
  Filled 2021-02-18: qty 1

## 2021-02-18 NOTE — Discharge Instructions (Addendum)
Please continue to monitor symptoms closely.  If you develop any new or worsening symptoms, please come back to the emergency department.  I would also recommend you continue to monitor your blood pressure.  We discussed that your blood pressure has been running a low and you discontinued your hydralazine recently but you were persistently hypertensive at this visit.  If you find that you are continuing to have elevated blood pressure at home I would recommend restarting your hydralazine.  Please follow-up with your regular doctor regarding your blood pressure as well.  It was a pleasure to meet you.

## 2021-02-18 NOTE — ED Provider Notes (Signed)
[facility]-EMERGENCY DEPT Provider Note   CSN: [id number] Arrival date & time: 02/18/21  1014     History Chief Complaint  Patient presents with   Leg Pain   Back Pain   Hyperglycemia    Stephanie [name] is a 55 y.o. female.  HPI Patient is a 55 year old female with a history of diabetes mellitus, hepatitis C, hypertension, sciatica, CVA, who presents to the emergency department due to right knee pain and low back pain.  Patient states her symptoms started about 1 week ago after falling and tripping over a dog.  She states that she landed on her buttocks and does not remember striking her knee.  She reports pain to the right anterior knee that worsens with ambulation as well as flexion/extension of the joint.  She states her low back pain is chronic.  She has been taking OTC pain medications with minimal relief.  Also notes that her blood sugar has recently been high.  No chest pain, shortness of breath, abdominal pain, fevers, chills.    Past Medical History:  Diagnosis Date   Diabetes mellitus without complication (HCC)    Difficult intubation    Diverticulitis    Headache    Hepatitis C    treated and resolved   History of kidney stones    Hypertension    IBS (irritable bowel syndrome)    Kidney stones    Sciatica    Sleep apnea    on cpap   Stroke (HCC) 2017   memory loss    Patient Active Problem List   Diagnosis Date Noted   Hypotension 08/22/2018   Bradycardia 08/22/2018   Type 2 diabetes mellitus (HCC) 08/22/2018   Acute encephalopathy 08/21/2018   Shoulder pain, right 12/22/2017   UTI (urinary tract infection) 08/21/2017   Nausea & vomiting 08/21/2017   Nicotine dependence 08/21/2017   Headache 01/07/2017   Small bowel obstruction (HCC) 09/02/2016   Pain and swelling of left upper extremity 07/22/2016   Superficial venous thrombosis of arm, left 07/22/2016   ICH (intracerebral hemorrhage) (HCC)    Essential hypertension, malignant  04/19/2016   Cytotoxic brain edema (HCC) 04/19/2016   IVH (intraventricular hemorrhage) (HCC) 04/18/2016   Hepatitis C     Past Surgical History:  Procedure Laterality Date   ABDOMINAL HYSTERECTOMY     carpel tunnel syndrome  2017   HARDWARE REMOVAL Right 12/22/2017   Procedure: HARDWARE REMOVAL-RIGHT ARM;  Surgeon: Lyndle Herrlich, MD;  Location: [facility] ORS;  Service: Orthopedics;  Laterality: Right;  Removal of implant, superficial right humerus   HUMERUS IM NAIL Right 03/11/2017   Procedure: INTRAMEDULLARY (IM) NAIL HUMERAL;  Surgeon: Lyndle Herrlich, MD;  Location: [facility] ORS;  Service: Orthopedics;  Laterality: Right;   KIDNEY STONE SURGERY Right 02/12/12   KIDNEY STONE SURGERY Left 07/15/2012   LIGATION OF ARTERIOVENOUS  FISTULA Left 06/21/2016   Procedure: LIGATION OF ARTERIOVENOUS  FISTULA ( LIGATION BASILIC VEIN );  Surgeon: Renford Dills, MD;  Location: [facility] ORS;  Service: Vascular;  Laterality: Left;   LITHOTRIPSY     OOPHORECTOMY     SINUS EXPLORATION     TONSILLECTOMY       OB History   No obstetric history on file.     Family History  Problem Relation Age of Onset   Heart failure Mother    Diabetes Father    Cancer Sister     Social History   Tobacco Use   Smoking status: Every  Day    Packs/day: 1.00    Years: 1.50    Pack years: 1.50    Types: Cigarettes   Smokeless tobacco: Never  Vaping Use   Vaping Use: Never used  Substance Use Topics   Alcohol use: Not Currently    Comment: no alcohol since 2002   Drug use: No    Home Medications Prior to Admission medications   Medication Sig Start Date End Date Taking? Authorizing Provider  ALPRAZolam Prudy Feeler) 1 MG tablet Take 1 mg by mouth as needed. 12/08/20   [provider]  alprazolam Prudy Feeler) 2 MG tablet Take 2 mg by mouth 2 (two) times daily as needed for anxiety. Patient not taking: Reported on 12/18/2020 08/04/19   [provider]  ARIPiprazole (ABILIFY) 20 MG tablet Take 20 mg by mouth  daily. 02/14/21   [provider]  ARIPiprazole (ABILIFY) 5 MG tablet Take 5 mg by mouth daily. 06/16/20   [provider]  budesonide-formoterol (SYMBICORT) 160-4.5 MCG/ACT inhaler Inhale 2 puffs into the lungs 2 (two) times daily.    [provider]  cetirizine (ZYRTEC) 10 MG tablet Take 10 mg by mouth daily. 06/18/20   [provider]  cetirizine (ZYRTEC) 10 MG tablet Take 10 mg by mouth at bedtime. 01/07/21   [provider]  Cetirizine HCl (ZYRTEC PO) Take by mouth.    [provider]  chlorthalidone (HYGROTON) 25 MG tablet Take 25 mg by mouth daily. 07/30/19   [provider]  cholecalciferol (VITAMIN D3) 25 MCG (1000 UT) tablet Take 1,000 Units by mouth daily.    [provider]  cloNIDine (CATAPRES) 0.2 MG tablet Take 0.2 mg by mouth 2 (two) times daily. 10/02/18   [provider]  clopidogrel (PLAVIX) 75 MG tablet Take 1 tablet (75 mg total) by mouth daily. 06/27/16   Marvel Plan, MD  diazepam (VALIUM) 10 MG tablet Take 15 mg by mouth 2 (two) times daily. 01/23/21   [provider]  etodolac (LODINE) 300 MG capsule Take 1 capsule (300 mg total) by mouth 3 (three) times daily as needed (pain). 01/16/19   Linwood Dibbles, MD  FARXIGA 10 MG TABS tablet Take 10 mg by mouth daily. 03/10/20   [provider]  FLUoxetine (PROZAC) 40 MG capsule Take 40 mg by mouth daily.  06/28/18   [provider]  Fremanezumab-vfrm (AJOVY) 225 MG/1.5ML SOAJ Inject 225 mg into the skin every 30 (thirty) days. 10/17/20   Butch Penny, NP  furosemide (LASIX) 40 MG tablet Take 40 mg by mouth daily as needed for edema. 01/23/21   [provider]  glipiZIDE (GLUCOTROL) 5 MG tablet Take 1 tablet (5 mg total) by mouth 2 (two) times daily before a meal. 04/30/16   Lora Paula, MD  hydrALAZINE (APRESOLINE) 100 MG tablet Take 100 mg by mouth 3 (three) times daily. 07/30/19   [provider]  hydrALAZINE  (APRESOLINE) 25 MG tablet Take 25 mg by mouth 2 (two) times daily. 11/27/20   [provider]  hydrOXYzine (ATARAX/VISTARIL) 25 MG tablet Take 25 mg by mouth daily. 07/06/20   [provider]  KLOR-CON M10 10 MEQ tablet Take 10 mEq by mouth daily as needed. With furosemide 01/23/21   [provider]  lamoTRIgine (LAMICTAL) 100 MG tablet Take 1 tablet (100 mg total) by mouth 2 (two) times daily. 06/20/20   York Spaniel, MD  levETIRAcetam (KEPPRA) 750 MG tablet Take 1 tablet by mouth twice daily 01/08/21  York Spaniel, MD  losartan (COZAAR) 100 MG tablet Take 100 mg by mouth daily. 01/23/21   [provider]  losartan (COZAAR) 50 MG tablet Take 50 mg by mouth daily. 06/19/20   [provider]  metoprolol succinate (TOPROL-XL) 25 MG 24 hr tablet Take 1 tablet by mouth daily. 11/12/20   [provider]  omeprazole (PRILOSEC) 40 MG capsule Take 40 mg by mouth daily as needed for heartburn. 08/13/19   [provider]  phentermine (ADIPEX-P) 37.5 MG tablet Take 37.5 mg by mouth every morning. 01/23/21   [provider]  prazosin (MINIPRESS) 1 MG capsule Take 3 mg by mouth every evening. 02/15/21   [provider]  predniSONE (DELTASONE) 10 MG tablet Take 4O mg with food once a day po for 3 days , then 20 mg for 3 days 11/13/20   Dohmeier, Porfirio Mylar, MD  pregabalin (LYRICA) 150 MG capsule Take 150 mg by mouth at bedtime. 12/05/20   [provider]  PROAIR HFA 108 (90 Base) MCG/ACT inhaler Inhale 1-2 puffs into the lungs every 4 (four) hours as needed for wheezing or shortness of breath. 11/17/20   [provider]  promethazine (PHENERGAN) 25 MG tablet Take 1 tablet (25 mg total) by mouth every 6 (six) hours as needed for nausea or vomiting. 04/21/19   Horton, Mayer Masker, MD  Rimegepant Sulfate (NURTEC) 75 MG TBDP Take 1 tablet daily onset of migraine. 07/20/20   Butch Penny, NP  rosuvastatin (CRESTOR) 20 MG tablet  SMARTSIG:1 Tablet(s) By Mouth Every Evening 12/06/20   [provider]  traZODone (DESYREL) 100 MG tablet Take 50-100 mg by mouth at bedtime. 02/14/21   [provider]  traZODone (DESYREL) 150 MG tablet Take 150 mg by mouth at bedtime as needed for sleep.  05/30/20   [provider]  TRINTELLIX 10 MG TABS tablet Take 10 mg by mouth daily. 04/05/20   [provider]  TRINTELLIX 20 MG TABS tablet Take 20 mg by mouth daily. 01/22/21   [provider]  UBRELVY 100 MG TABS Take 1 tablet by mouth See admin instructions. Take 1 tablet (100mg ) by mouth daily as needed for migraine. May repeat dose in 2 hours if needed. 08/23/20   [provider]    Allergies    Gabapentin, Ibuprofen, Tylenol [acetaminophen], Aspirin, Buprenorphine hcl, Carbamazepine, Codeine, Compazine, Elemental sulfur, Ioxaglate, Ivp dye [iodinated diagnostic agents], Metrizamide, Naproxen, Norco [hydrocodone-acetaminophen], Other, Penicillin g, Prochlorperazine maleate, Reglan [metoclopramide], Toradol [ketorolac tromethamine], Tramadol, and Zofran [ondansetron hcl]  Review of Systems   Review of Systems  All other systems reviewed and are negative. Ten systems reviewed and are negative for acute change, except as noted in the HPI.   Physical Exam Updated Vital Signs BP (!) 201/118 (BP Location: Left Arm)   Pulse 60   Temp 97.7 F (36.5 C) (Oral)   Resp 16   SpO2 99%   Physical Exam Vitals and nursing note reviewed.  Constitutional:      General: She is not in acute distress.    Appearance: Normal appearance. She is not ill-appearing, toxic-appearing or diaphoretic.  HENT:     Head: Normocephalic and atraumatic.     Right Ear: External ear normal.     Left Ear: External ear normal.     Nose: Nose normal.     Mouth/Throat:     Mouth: Mucous membranes are moist.     Pharynx: Oropharynx is clear. No oropharyngeal exudate or posterior oropharyngeal erythema.  Eyes:      General: No scleral icterus.       Right eye: No discharge.        Left eye: No discharge.     Extraocular Movements: Extraocular movements intact.     Conjunctiva/sclera: Conjunctivae normal.  Cardiovascular:     Rate and Rhythm: Normal rate and regular rhythm.     Pulses: Normal pulses.     Heart sounds: Normal heart sounds. No murmur heard.   No friction rub. No gallop.  Pulmonary:     Effort: Pulmonary effort is normal. No respiratory distress.     Breath sounds: Normal breath sounds. No stridor. No wheezing, rhonchi or rales.  Abdominal:     General: Abdomen is flat.     Palpations: Abdomen is soft.     Tenderness: There is no abdominal tenderness.  Musculoskeletal:        General: Tenderness present. Normal range of motion.     Cervical back: Normal range of motion and neck supple. No tenderness.     Right lower leg: No edema.     Left lower leg: No edema.     Comments: Moderate TTP noted along the right anterior knee and medial joint line.  Unable to assess range of motion or laxity in the joint due to patient's pain.  No visible joint effusion though difficult to assess due to body habitus.  No increased warmth in the joint.  No overlying skin changes.  No leg swelling or calf pain.  Additional mild TTP noted to the left lumbar musculature.  No midline spine pain.  Skin:    General: Skin is warm and dry.  Neurological:     General: No focal deficit present.     Mental Status: She is alert and oriented to person, place, and time.  Psychiatric:        Mood and Affect: Mood normal.        Behavior: Behavior normal.   ED Results / Procedures / Treatments   Labs (all labs ordered are listed, but only abnormal results are displayed) Labs Reviewed  BASIC METABOLIC PANEL - Abnormal; Notable for the following components:      Result Value   Glucose, Bld 298 (*)    Creatinine, Ser 1.02 (*)    All other components within normal limits  URINALYSIS, ROUTINE W REFLEX MICROSCOPIC  - Abnormal; Notable for the following components:   APPearance HAZY (*)    Glucose, UA >=500 (*)    Hgb urine dipstick SMALL (*)    Leukocytes,Ua SMALL (*)    Bacteria, UA MANY (*)    All other components within normal limits  CBG MONITORING, ED - Abnormal; Notable for the following components:   Glucose-Capillary 358 (*)    All other components within normal limits  CBG MONITORING, ED - Abnormal; Notable for the following components:   Glucose-Capillary 293 (*)    All other components within normal limits  CBG MONITORING, ED - Abnormal; Notable for the following components:   Glucose-Capillary 199 (*)    All other components within normal limits  CBC   EKG None  Radiology DG Knee Complete 4 Views Right  Result Date: 02/18/2021 CLINICAL DATA:  Right lateral knee pain after tripping over the dog a week ago. EXAM: RIGHT KNEE - COMPLETE 4+ VIEW COMPARISON:  Right knee x-rays dated June 26, 2018. FINDINGS: No evidence of fracture, dislocation, or joint effusion. No evidence of arthropathy or other focal bone abnormality. Soft tissues  are unremarkable. IMPRESSION: Negative. Electronically Signed   By: Obie DredgeWilliam T Derry M.D.   On: 02/18/2021 13:03    Procedures Procedures   Medications Ordered in ED Medications  morphine 4 MG/ML injection 4 mg (4 mg Intramuscular Given 02/18/21 1253)  morphine 2 MG/ML injection 2 mg (2 mg Intramuscular Given 02/18/21 1525)    ED Course  I have reviewed the triage vital signs and the nursing notes.  Pertinent labs & imaging results that were available during my care of the patient were reviewed by me and considered in my medical decision making (see chart for details).    MDM Rules/Calculators/A&P                          Pt is a 55 y.o. female who presents to the emergency department with low back pain and right knee pain after fall that occurred 1 week ago.  Labs: CT without abnormalities. BMP with a glucose of 298 and a creatinine of 1.02.   No elevation in anion gap at 8.  GFR greater than 60. UA with glucosuria greater than 500, small hemoglobin, small leukocytes, many bacteria.  Imaging: X-ray of the right knee is negative.  I, Placido SouLogan Lailoni Baquera, PA-C, personally reviewed and evaluated these images and lab results as part of my medical decision-making.  Physical exam and imaging is reassuring of the right knee.  She was given morphine in the emergency department with moderate relief of her symptoms.  She now notes significantly improved range of motion of the knee.  Lab work generally reassuring.  CBC without leukocytosis.  Mildly elevated creatinine but GFR greater than 60.  No anion gap.  Afebrile and not tachycardic.  Patient was hypertensive at this visit.  She denies any chest pain or shortness of breath.  Patient states that she was dealing with episodes of hypotension recently and her primary doctor discontinued her hydralazine.  We discussed this at length.  Recommended that she continue to monitor her BP at home and if she finds that it is persistently elevated she should consider reaching out to her primary doctor to restart her hydralazine.  She verbalized understanding of this.  Feel the patient is stable for discharge at this time and she is agreeable.  Given strict return precautions.  Recommended she follow-up with orthopedics regarding her knee.  Her questions were answered and she was amicable at the time of discharge.  Note: Portions of this report may have been transcribed using voice recognition software. Every effort was made to ensure accuracy; however, inadvertent computerized transcription errors may be present.   Final Clinical Impression(s) / ED Diagnoses Final diagnoses:  Acute pain of right knee  Chronic left-sided low back pain without sciatica  Hyperglycemia   Rx / DC Orders ED Discharge Orders     None        Placido SouJoldersma, Jalin Erpelding, PA-C 02/18/21 1627    Vanetta MuldersZackowski, Scott, MD 02/21/21 [phone]

## 2021-02-18 NOTE — ED Triage Notes (Signed)
Patient c/o right leg pain and lower back pain after tripping over dog x1 week ago. Reports hyperglycemia x2 days despite taking prescribed medications. Hx type 2 diabetes. CBG 358 in triage.

## 2021-02-19 ENCOUNTER — Encounter: Payer: Self-pay | Admitting: Adult Health

## 2021-03-07 IMAGING — CT CT HEAD WITHOUT CONTRAST
3 of 4 series · 15 of 47 positions shown, 18 images · non-contrast
Comparison: 11/18/2018

CLINICAL DATA: Headache. History of stroke.

EXAM:
CT HEAD WITHOUT CONTRAST
TECHNIQUE: Contiguous axial images were obtained from the base of the skull
through the vertex without intravenous contrast.

[Series 2: head wo · axial · 0.47mm/px · z∈[+1448,+1568]mm · 9 of 30 slices shown, 12 images]
[im 3/30  brain]
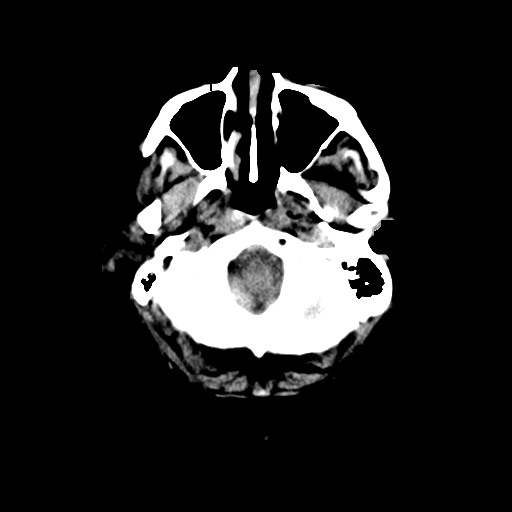
[im 3/30  bone]
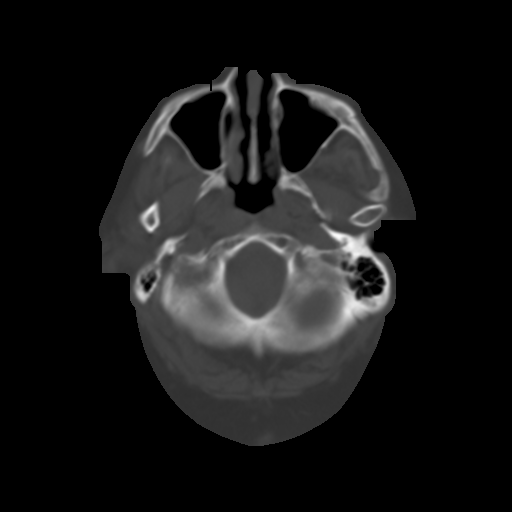
[im 7/30  brain]
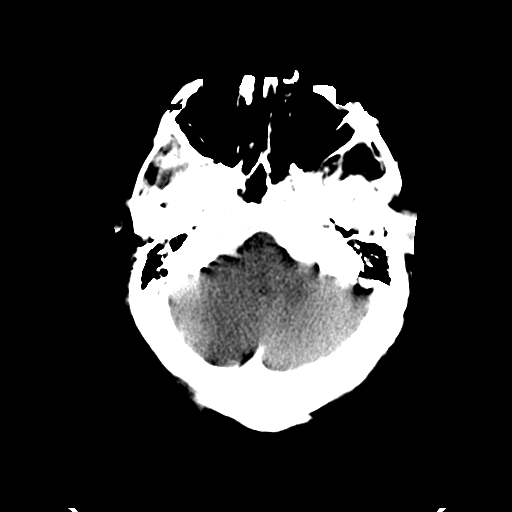
[im 9/30  brain]
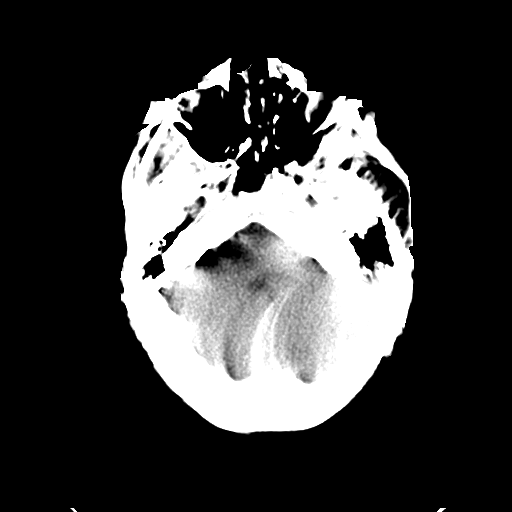
[im 13/30  brain]
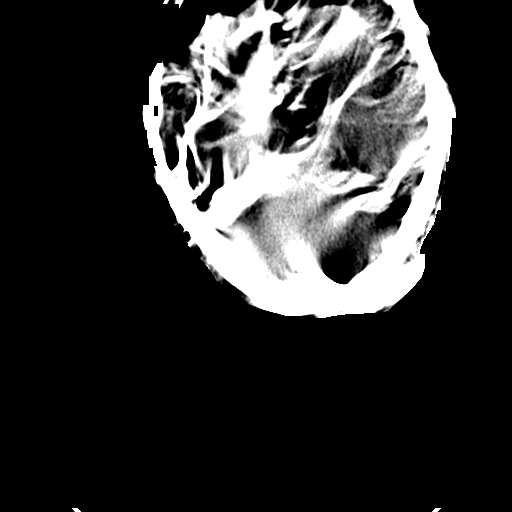
[im 15/30  brain]
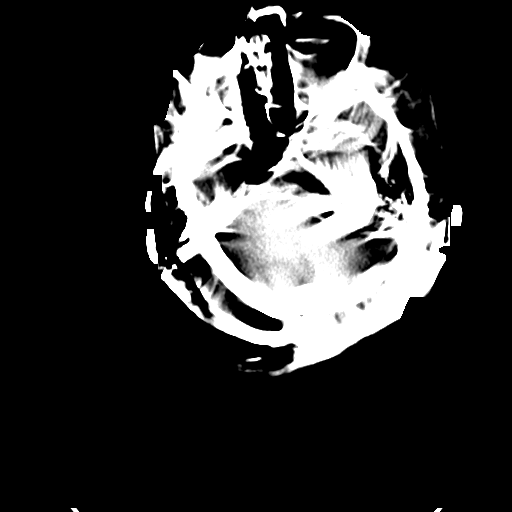
[im 15/30  bone]
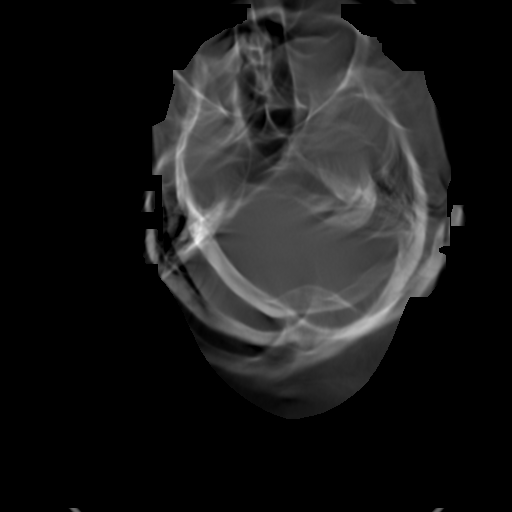
[im 17/30  brain]
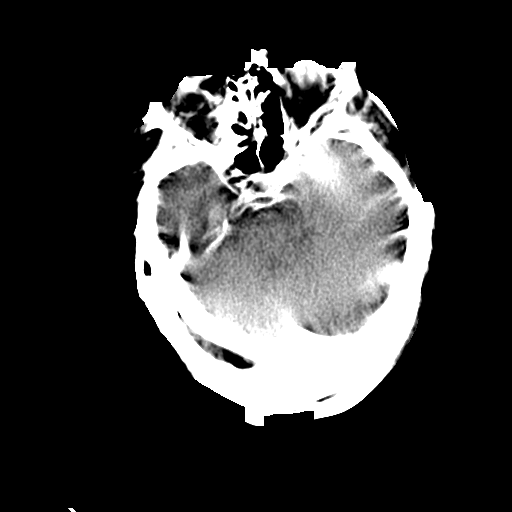
[im 21/30  brain]
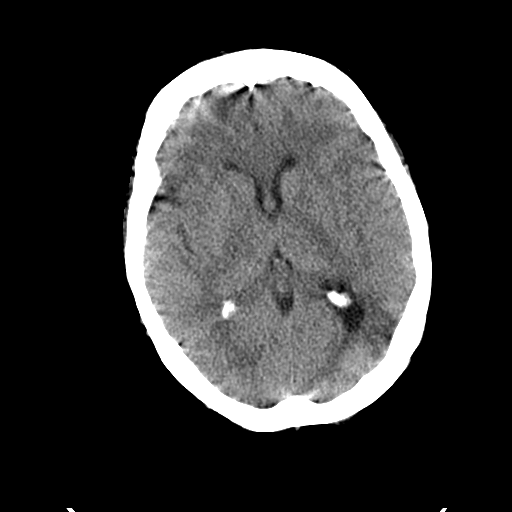
[im 23/30  brain]
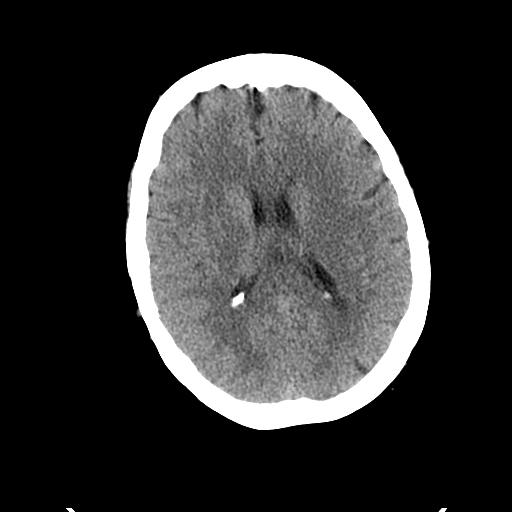
[im 27/30  brain]
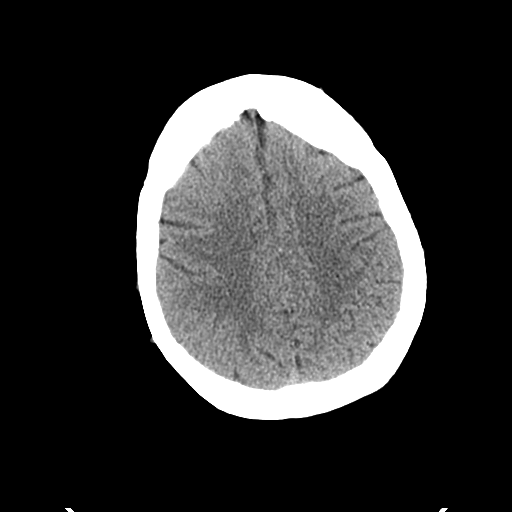
[im 27/30  bone]
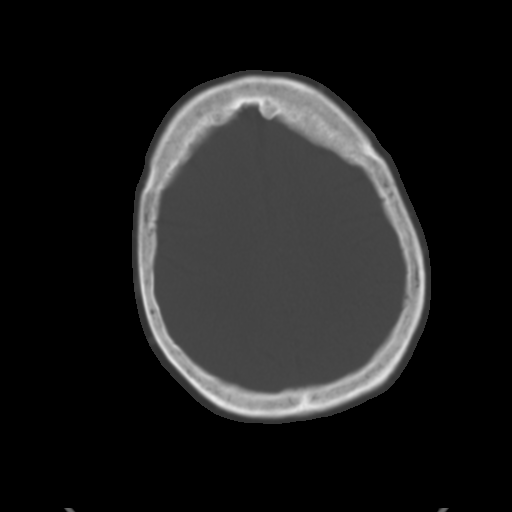

[Series 7: coronal soft tissue · coronal · 0.38mm/px · 3 of 60 slices shown]
[im 20/60  brain]
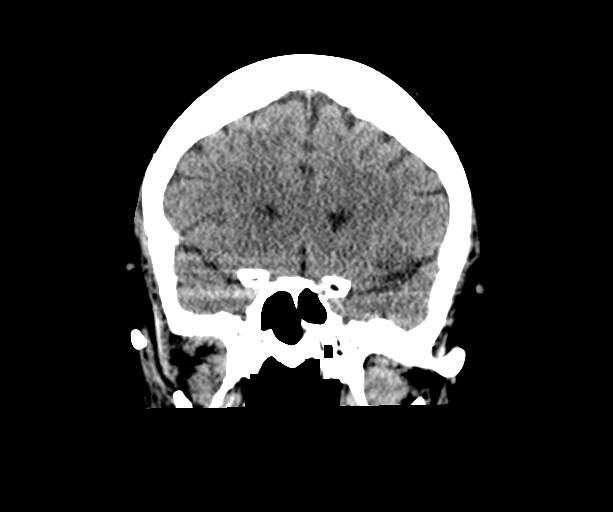
[im 27/60  brain]
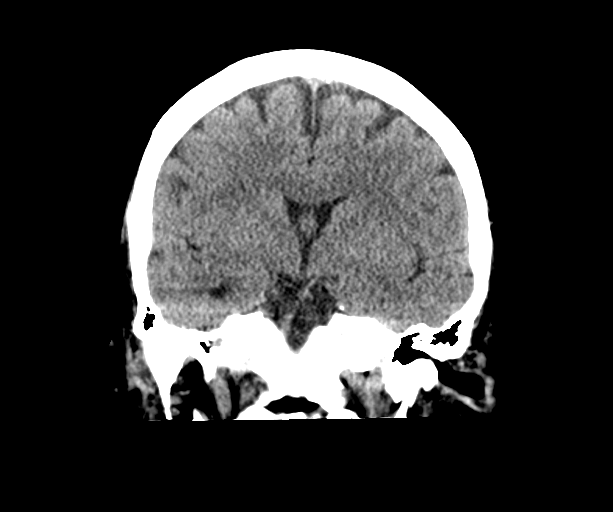
[im 33/60  brain]
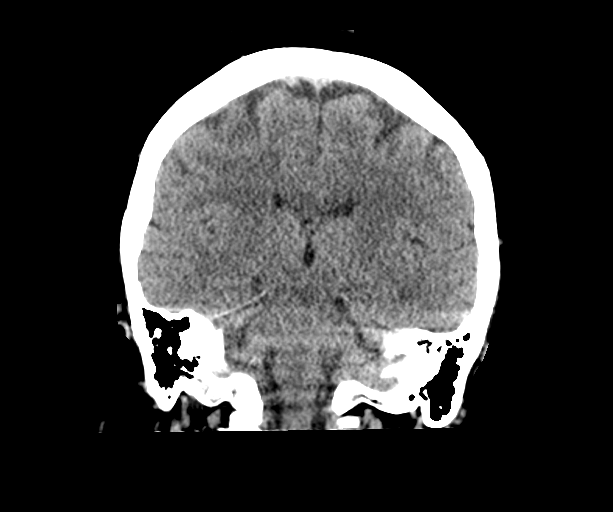

[Series 8: sagittal soft tissue · sagittal · 0.40mm/px · 3 of 48 slices shown]
[im 16/48  brain]
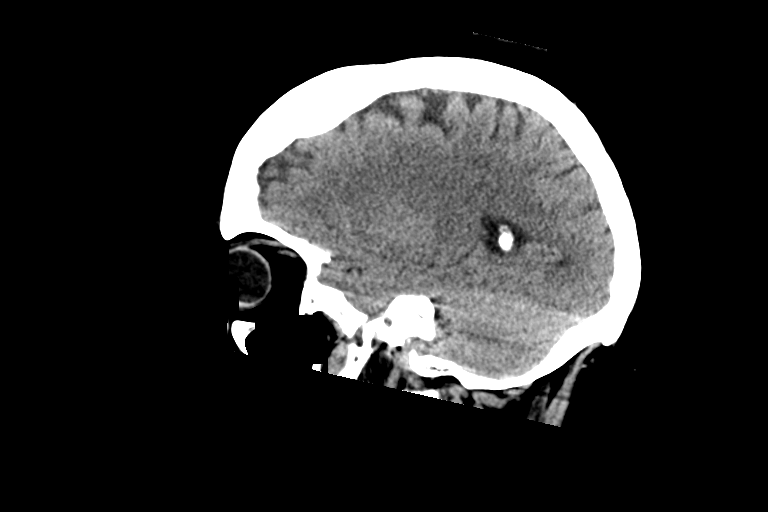
[im 24/48  brain]
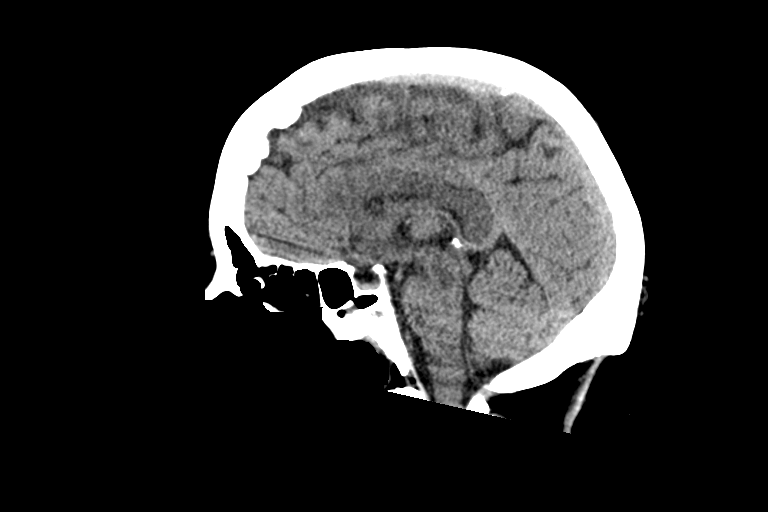
[im 32/48  brain]
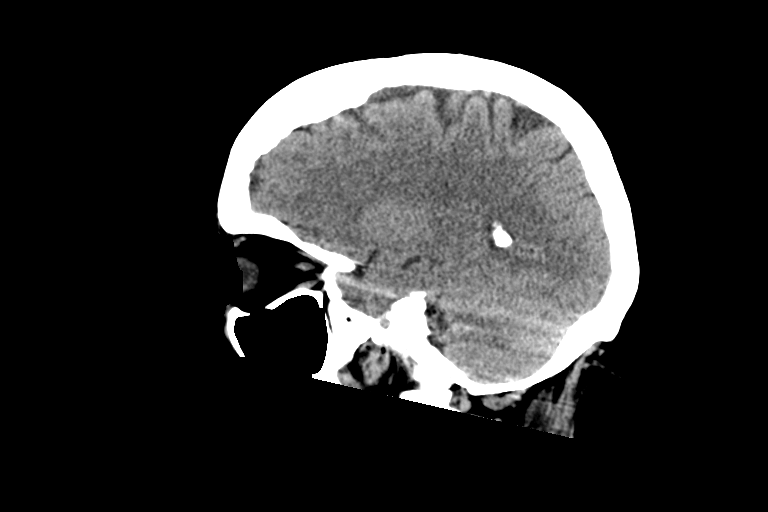

[15 of 47 positions shown; findings below may reference images not displayed]

FINDINGS: Brain: A chronic left temporo-occipital infarct is again noted with
ex vacuo dilatation of the atrium of the left lateral ventricle.
There is no evidence of acute infarct, intracranial hemorrhage,
mass, midline shift, or extra-axial fluid collection.

Vascular: Calcified atherosclerosis at the skull base. No hyperdense
vessel.

Skull: No fracture or focal osseous lesion.

Sinuses/Orbits: Mild right ethmoid and right sphenoid sinus mucosal
thickening. Clear mastoid air cells. Unremarkable orbits.

Other: None.
IMPRESSION: 1. No evidence of acute intracranial abnormality.
2. Chronic left temporo-occipital infarct.

## 2021-03-12 IMAGING — CR ABDOMEN - 1 VIEW
2 series · 2 of 2 positions shown · non-contrast
Comparison: 02/05/2018

CLINICAL DATA: Right-sided flank pain for several weeks

EXAM:
ABDOMEN - 1 VIEW

[x abdomen supine (1 of 2)]
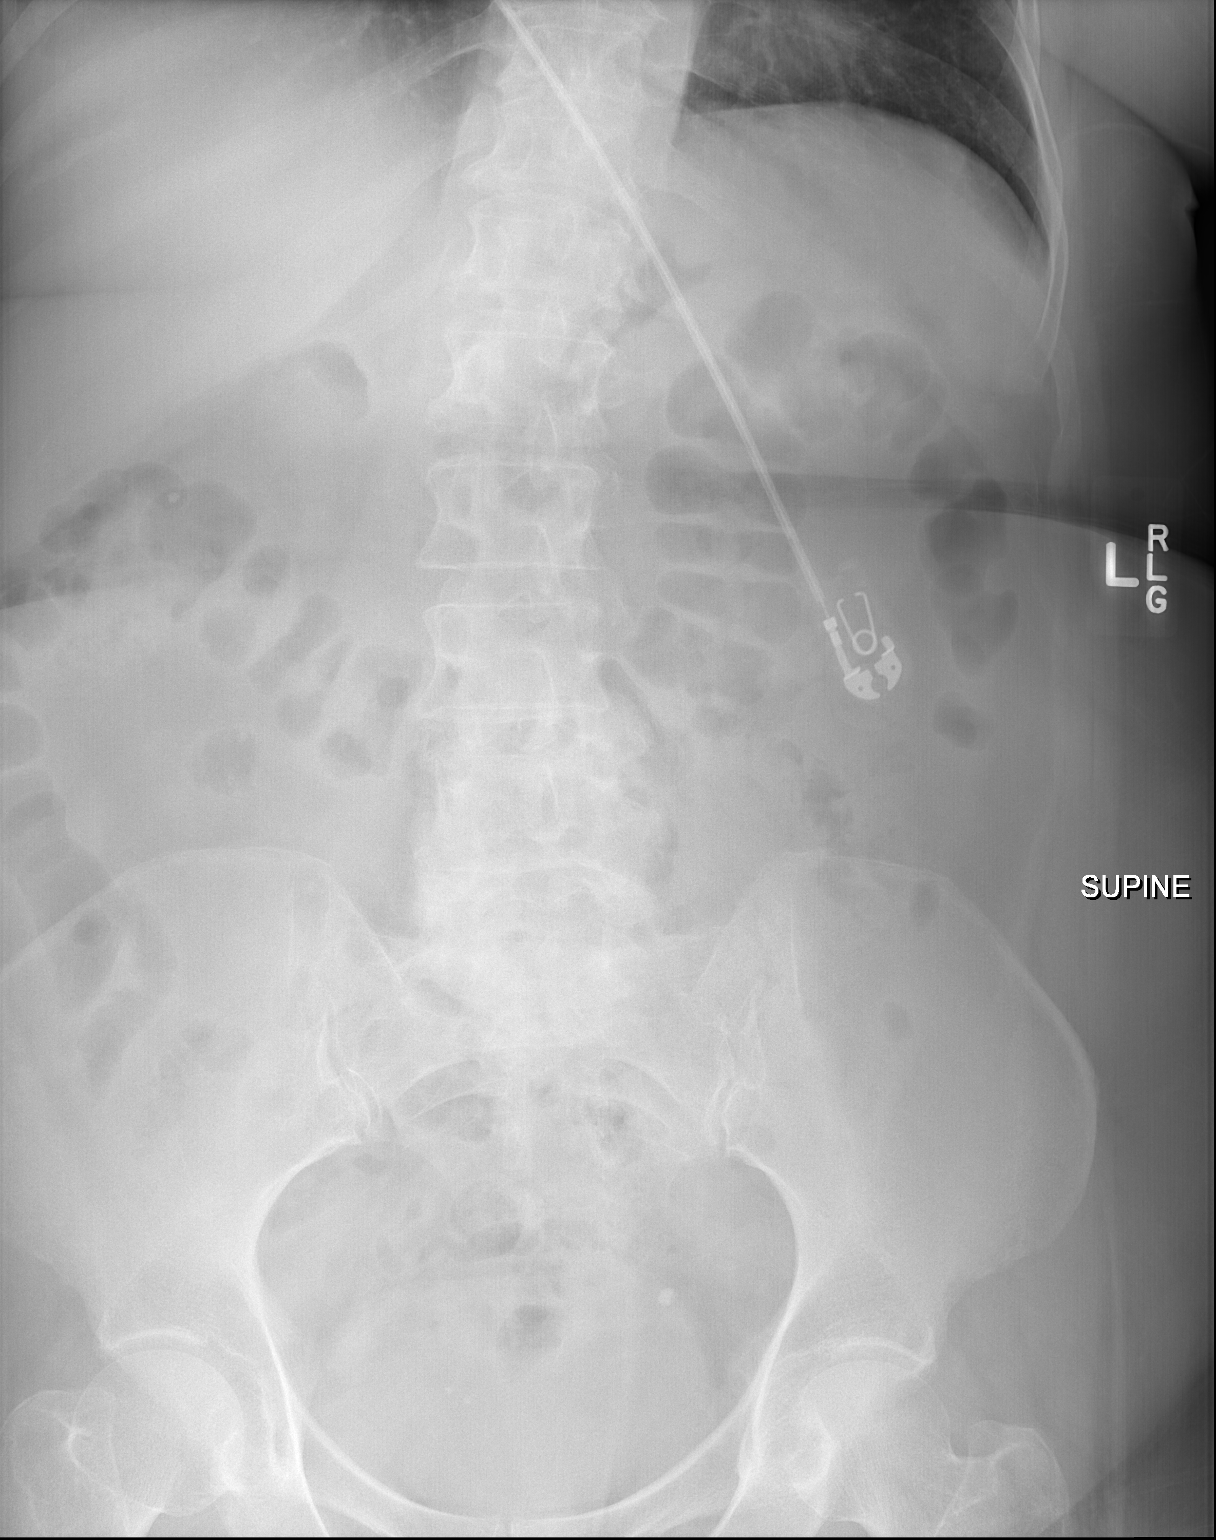

[x abdomen supine (2 of 2)]
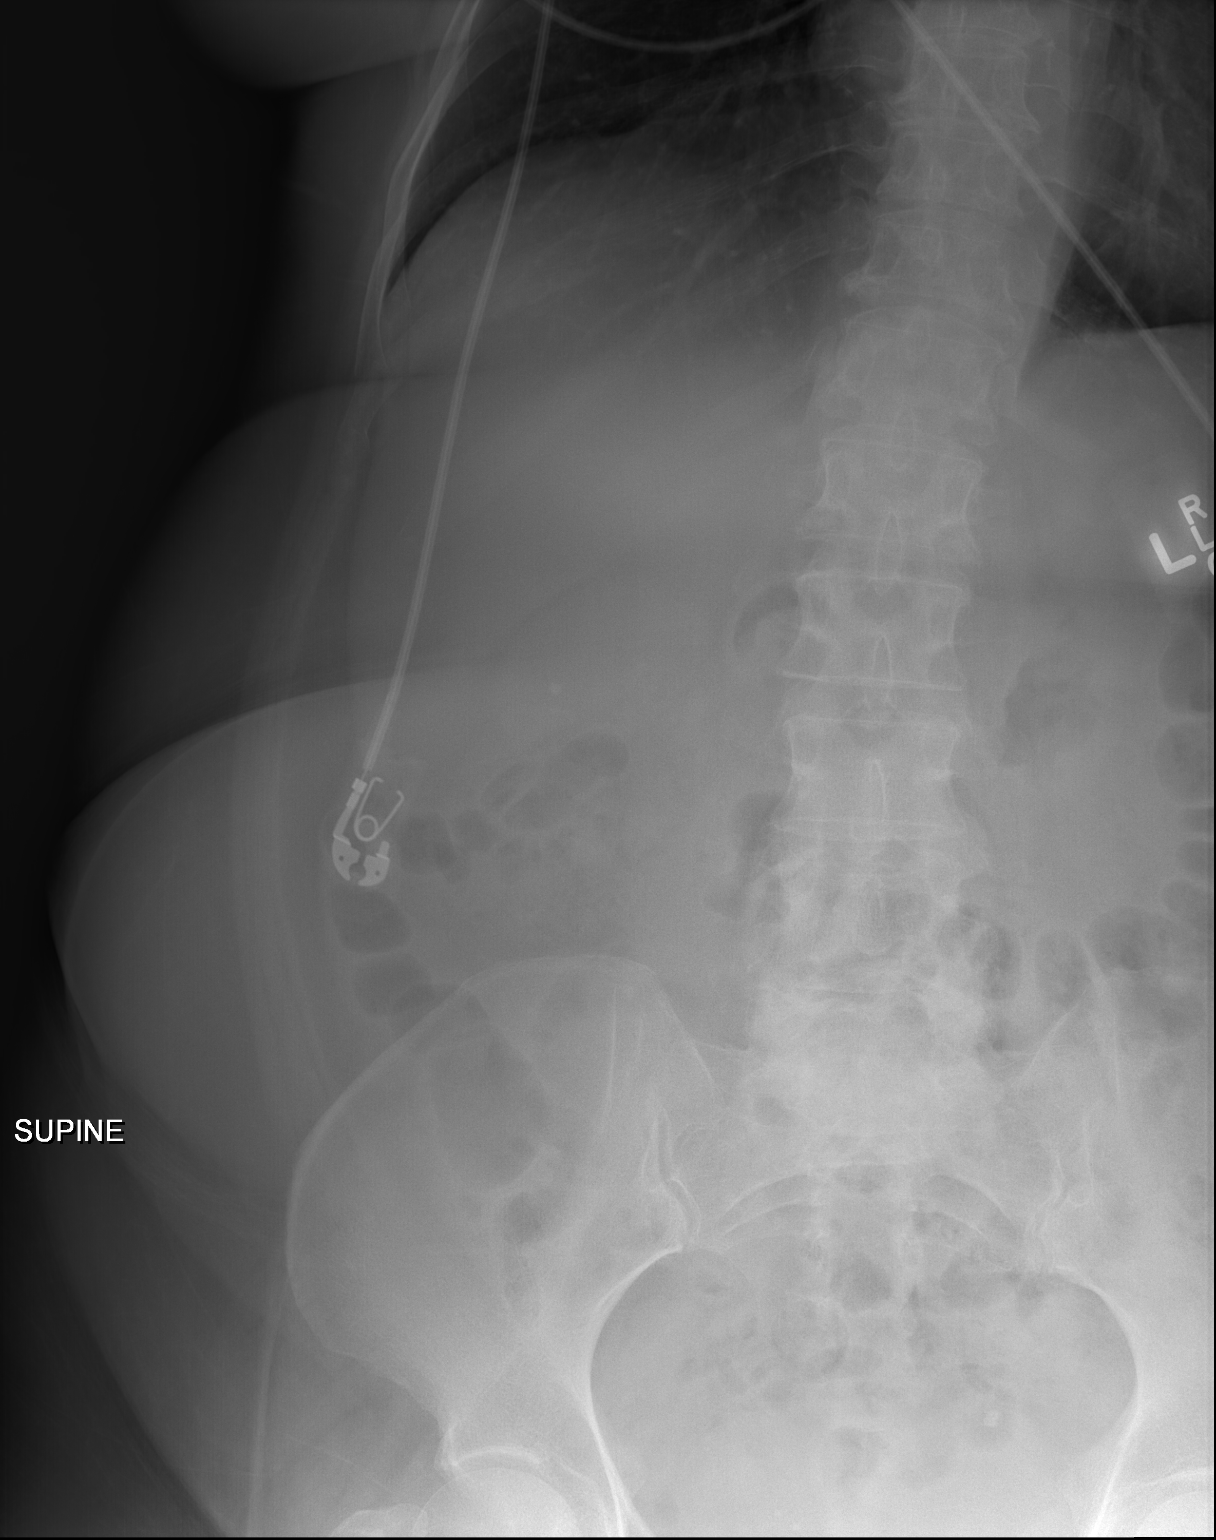

[2 of 2 positions shown; findings below may reference images not displayed]

FINDINGS: Five lumbar type vertebral bodies are well visualized. Vertebral
body height is well maintained. Parenchymal calcification in the
right kidney is noted although no definitive renal calculi are noted
on this exam. No bony abnormality is noted.
IMPRESSION: No acute abnormality noted.

## 2021-03-14 ENCOUNTER — Emergency Department (HOSPITAL_COMMUNITY): Payer: Medicaid Other

## 2021-03-14 ENCOUNTER — Emergency Department (HOSPITAL_COMMUNITY)
Admission: EM | Admit: 2021-03-14 | Discharge: 2021-03-14 | Disposition: A | Discharge status: Home / Self Care | Payer: Medicaid Other | Attending: Emergency Medicine | Admitting: Emergency Medicine

## 2021-03-14 ENCOUNTER — Other Ambulatory Visit: Payer: Self-pay

## 2021-03-14 ENCOUNTER — Encounter (HOSPITAL_COMMUNITY): Payer: Self-pay

## 2021-03-14 DIAGNOSIS — Z7902 Long term (current) use of antithrombotics/antiplatelets: Secondary | ICD-10-CM | POA: Insufficient documentation

## 2021-03-14 DIAGNOSIS — Z7984 Long term (current) use of oral hypoglycemic drugs: Secondary | ICD-10-CM | POA: Insufficient documentation

## 2021-03-14 DIAGNOSIS — E119 Type 2 diabetes mellitus without complications: Secondary | ICD-10-CM | POA: Insufficient documentation

## 2021-03-14 DIAGNOSIS — S0990XA Unspecified injury of head, initial encounter: Secondary | ICD-10-CM

## 2021-03-14 DIAGNOSIS — Y9389 Activity, other specified: Secondary | ICD-10-CM | POA: Diagnosis not present

## 2021-03-14 DIAGNOSIS — Z79899 Other long term (current) drug therapy: Secondary | ICD-10-CM | POA: Diagnosis not present

## 2021-03-14 DIAGNOSIS — S0083XA Contusion of other part of head, initial encounter: Secondary | ICD-10-CM | POA: Diagnosis not present

## 2021-03-14 DIAGNOSIS — W01190A Fall on same level from slipping, tripping and stumbling with subsequent striking against furniture, initial encounter: Secondary | ICD-10-CM | POA: Diagnosis not present

## 2021-03-14 DIAGNOSIS — I10 Essential (primary) hypertension: Secondary | ICD-10-CM | POA: Diagnosis not present

## 2021-03-14 DIAGNOSIS — F17210 Nicotine dependence, cigarettes, uncomplicated: Secondary | ICD-10-CM | POA: Diagnosis not present

## 2021-03-14 DIAGNOSIS — Y92009 Unspecified place in unspecified non-institutional (private) residence as the place of occurrence of the external cause: Secondary | ICD-10-CM

## 2021-03-14 MED ORDER — TRAMADOL HCL 50 MG PO TABS: 50.0000 mg | ORAL_TABLET | Freq: Four times a day (QID) | ORAL | 0 refills | Status: DC | PRN

## 2021-03-14 MED ORDER — TRAMADOL HCL 50 MG PO TABS
50.0000 mg | ORAL_TABLET | Freq: Once | ORAL | Status: AC
  Administered 2021-03-14: 50 mg via ORAL
  Filled 2021-03-14: qty 1

## 2021-03-14 NOTE — ED Provider Notes (Signed)
[facility] Provider [facility]   CSN: [id number] Arrival date & time: 03/14/21  [id number]     History Chief Complaint  Patient presents with   Fall   Facial Pain    Stephanie [name] is a 55 y.o. female.  HPI She presents for evaluation of injury which occurred this morning.  She states she noticed dizziness this morning but was able to ambulate, had bent over to pick up a piece of paper when she fell forward striking her face on some plywood that was in the hallway.  She was able to ambulate afterwards, when her husband helped her up.  She came here by private vehicle.  She denies loss of consciousness.  She denies neck pain, weakness or paresthesia.  She has a headache, and pain in her right eye area.  She was able to take her morning medications and eat.  She does not take anticoagulants.  She has a history of intracranial bleeding causing a stroke.  She denies recent illnesses including fever, vomiting, cough or chest pain.  There are no other known active modifying factors.    Past Medical History:  Diagnosis Date   Diabetes mellitus without complication (HCC)    Difficult intubation    Diverticulitis    Headache    Hepatitis C    treated and resolved   History of kidney stones    Hypertension    IBS (irritable bowel syndrome)    Kidney stones    Sciatica    Sleep apnea    on cpap   Stroke (HCC) 2017   memory loss    Patient Active Problem List   Diagnosis Date Noted   Hypotension 08/22/2018   Bradycardia 08/22/2018   Type 2 diabetes mellitus (HCC) 08/22/2018   Acute encephalopathy 08/21/2018   Shoulder pain, right 12/22/2017   UTI (urinary tract infection) 08/21/2017   Nausea & vomiting 08/21/2017   Nicotine dependence 08/21/2017   Headache 01/07/2017   Small bowel obstruction (HCC) 09/02/2016   Pain and swelling of left upper extremity 07/22/2016   Superficial venous thrombosis of arm, left 07/22/2016   ICH (intracerebral  hemorrhage) (HCC)    Essential hypertension, malignant 04/19/2016   Cytotoxic brain edema (HCC) 04/19/2016   IVH (intraventricular hemorrhage) (HCC) 04/18/2016   Hepatitis C     Past Surgical History:  Procedure Laterality Date   ABDOMINAL HYSTERECTOMY     carpel tunnel syndrome  2017   HARDWARE REMOVAL Right 12/22/2017   Procedure: HARDWARE REMOVAL-RIGHT ARM;  Surgeon: Lyndle HerrlichBowers, James R, MD;  Location: [facility] ORS;  Service: Orthopedics;  Laterality: Right;  Removal of implant, superficial right humerus   HUMERUS IM NAIL Right 03/11/2017   Procedure: INTRAMEDULLARY (IM) NAIL HUMERAL;  Surgeon: Lyndle HerrlichBowers, James R, MD;  Location: [facility] ORS;  Service: Orthopedics;  Laterality: Right;   KIDNEY STONE SURGERY Right 02/12/12   KIDNEY STONE SURGERY Left 07/15/2012   LIGATION OF ARTERIOVENOUS  FISTULA Left 06/21/2016   Procedure: LIGATION OF ARTERIOVENOUS  FISTULA ( LIGATION BASILIC VEIN );  Surgeon: Renford DillsGregory G Schnier, MD;  Location: [facility] ORS;  Service: Vascular;  Laterality: Left;   LITHOTRIPSY     OOPHORECTOMY     SINUS EXPLORATION     TONSILLECTOMY       OB History   No obstetric history on file.     Family History  Problem Relation Age of Onset   Heart failure Mother    Diabetes Father    Cancer Sister  Social History   Tobacco Use   Smoking status: Every Day    Packs/day: 1.00    Years: 1.50    Pack years: 1.50    Types: Cigarettes   Smokeless tobacco: Never  Vaping Use   Vaping Use: Never used  Substance Use Topics   Alcohol use: Not Currently    Comment: no alcohol since 2002   Drug use: No    Home Medications Prior to Admission medications   Medication Sig Start Date End Date Taking? Authorizing Provider  traMADol (ULTRAM) 50 MG tablet Take 1 tablet (50 mg total) by mouth every 6 (six) hours as needed for moderate pain. 03/14/21  Yes Mancel Bale, MD  ALPRAZolam Prudy Feeler) 1 MG tablet Take 1 mg by mouth as needed. 12/08/20   [provider]  alprazolam Prudy Feeler) 2 MG  tablet Take 2 mg by mouth 2 (two) times daily as needed for anxiety. Patient not taking: Reported on 12/18/2020 08/04/19   [provider]  ARIPiprazole (ABILIFY) 20 MG tablet Take 20 mg by mouth daily. 02/14/21   [provider]  ARIPiprazole (ABILIFY) 5 MG tablet Take 5 mg by mouth daily. 06/16/20   [provider]  budesonide-formoterol (SYMBICORT) 160-4.5 MCG/ACT inhaler Inhale 2 puffs into the lungs 2 (two) times daily.    [provider]  cetirizine (ZYRTEC) 10 MG tablet Take 10 mg by mouth daily. 06/18/20   [provider]  cetirizine (ZYRTEC) 10 MG tablet Take 10 mg by mouth at bedtime. 01/07/21   [provider]  Cetirizine HCl (ZYRTEC PO) Take by mouth.    [provider]  chlorthalidone (HYGROTON) 25 MG tablet Take 25 mg by mouth daily. 07/30/19   [provider]  cholecalciferol (VITAMIN D3) 25 MCG (1000 UT) tablet Take 1,000 Units by mouth daily.    [provider]  cloNIDine (CATAPRES) 0.2 MG tablet Take 0.2 mg by mouth 2 (two) times daily. 10/02/18   [provider]  clopidogrel (PLAVIX) 75 MG tablet Take 1 tablet (75 mg total) by mouth daily. 06/27/16   Marvel Plan, MD  diazepam (VALIUM) 10 MG tablet Take 15 mg by mouth 2 (two) times daily. 01/23/21   [provider]  etodolac (LODINE) 300 MG capsule Take 1 capsule (300 mg total) by mouth 3 (three) times daily as needed (pain). 01/16/19   Linwood Dibbles, MD  FARXIGA 10 MG TABS tablet Take 10 mg by mouth daily. 03/10/20   [provider]  FLUoxetine (PROZAC) 40 MG capsule Take 40 mg by mouth daily.  06/28/18   [provider]  Fremanezumab-vfrm (AJOVY) 225 MG/1.5ML SOAJ Inject 225 mg into the skin every 30 (thirty) days. 10/17/20   Butch Penny, NP  furosemide (LASIX) 40 MG tablet Take 40 mg by mouth daily as needed for edema. 01/23/21   [provider]  glipiZIDE (GLUCOTROL) 5 MG tablet Take 1 tablet (5 mg total) by mouth 2  (two) times daily before a meal. 04/30/16   Lora Paula, MD  hydrALAZINE (APRESOLINE) 100 MG tablet Take 100 mg by mouth 3 (three) times daily. 07/30/19   [provider]  hydrALAZINE (APRESOLINE) 25 MG tablet Take 25 mg by mouth 2 (two) times daily. 11/27/20   [provider]  hydrOXYzine (ATARAX/VISTARIL) 25 MG tablet Take 25 mg by mouth daily. 07/06/20   [provider]  KLOR-CON M10 10 MEQ tablet Take 10 mEq by mouth daily as needed. With furosemide 01/23/21   [provider]  lamoTRIgine (LAMICTAL) 100 MG tablet Take 1 tablet (100 mg total) by mouth 2 (two) times daily. 06/20/20   York Spaniel, MD  levETIRAcetam (KEPPRA) 750 MG tablet Take 1 tablet by mouth twice daily 01/08/21   York Spaniel, MD  losartan (COZAAR) 100 MG tablet Take 100 mg by mouth daily. 01/23/21   [provider]  losartan (COZAAR) 50 MG tablet Take 50 mg by mouth daily. 06/19/20   [provider]  metoprolol succinate (TOPROL-XL) 25 MG 24 hr tablet Take 1 tablet by mouth daily. 11/12/20   [provider]  omeprazole (PRILOSEC) 40 MG capsule Take 40 mg by mouth daily as needed for heartburn. 08/13/19   [provider]  phentermine (ADIPEX-P) 37.5 MG tablet Take 37.5 mg by mouth every morning. 01/23/21   [provider]  prazosin (MINIPRESS) 1 MG capsule Take 3 mg by mouth every evening. 02/15/21   [provider]  predniSONE (DELTASONE) 10 MG tablet Take 4O mg with food once a day po for 3 days , then 20 mg for 3 days 11/13/20   Dohmeier, Porfirio Mylar, MD  pregabalin (LYRICA) 150 MG capsule Take 150 mg by mouth at bedtime. 12/05/20   [provider]  PROAIR HFA 108 (90 Base) MCG/ACT inhaler Inhale 1-2 puffs into the lungs every 4 (four) hours as needed for wheezing or shortness of breath. 11/17/20   [provider]  promethazine (PHENERGAN) 25 MG tablet Take 1 tablet (25 mg total) by mouth every 6 (six) hours as needed for  nausea or vomiting. 04/21/19   Horton, Mayer Masker, MD  Rimegepant Sulfate (NURTEC) 75 MG TBDP Take 1 tablet daily onset of migraine. 07/20/20   Butch Penny, NP  rosuvastatin (CRESTOR) 20 MG tablet SMARTSIG:1 Tablet(s) By Mouth Every Evening 12/06/20   [provider]  traZODone (DESYREL) 100 MG tablet Take 50-100 mg by mouth at bedtime. 02/14/21   [provider]  traZODone (DESYREL) 150 MG tablet Take 150 mg by mouth at bedtime as needed for sleep.  05/30/20   [provider]  TRINTELLIX 10 MG TABS tablet Take 10 mg by mouth daily. 04/05/20   [provider]  TRINTELLIX 20 MG TABS tablet Take 20 mg by mouth daily. 01/22/21   [provider]  UBRELVY 100 MG TABS Take 1 tablet by mouth See admin instructions. Take 1 tablet (100mg ) by mouth daily as needed for migraine. May repeat dose in 2 hours if needed. 08/23/20   [provider]    Allergies    Gabapentin, Ibuprofen, Tylenol [acetaminophen], Aspirin, Buprenorphine hcl, Carbamazepine, Codeine, Compazine, Elemental sulfur, Ioxaglate, Ivp dye [iodinated diagnostic agents], Metrizamide, Naproxen, Norco [hydrocodone-acetaminophen], Other, Penicillin g, Prochlorperazine maleate, Reglan [metoclopramide], Toradol [ketorolac tromethamine], Tramadol, and Zofran [ondansetron hcl]  Review of Systems   Review of Systems  All other systems reviewed and are negative.  Physical Exam Updated Vital Signs BP 136/86 (BP Location: Right Arm)   Pulse 74   Temp 98.4 F (36.9 C) (Oral)   Resp 18   SpO2 97%   Physical Exam Vitals and nursing note reviewed.  Constitutional:      General: She is not in acute distress.    Appearance: She is well-developed. She is obese. She is not toxic-appearing or diaphoretic.  HENT:     Head: Normocephalic.     Comments: Very minor swelling, beneath medial left thigh, with tiny abrasion which is not bleeding.  No midface crepitation or deformity.  No trismus.  No  visible  dental injury.  Tender on cranium without deformity, swelling, abrasion or laceration.    Right Ear: External ear normal.     Left Ear: External ear normal.     Nose: No congestion or rhinorrhea.     Mouth/Throat:     Pharynx: No oropharyngeal exudate or posterior oropharyngeal erythema.     Comments: Moist mucous membranes. Eyes:     Conjunctiva/sclera: Conjunctivae normal.     Pupils: Pupils are equal, round, and reactive to light.  Neck:     Trachea: Phonation normal.  Cardiovascular:     Rate and Rhythm: Normal rate and regular rhythm.  Pulmonary:     Effort: Pulmonary effort is normal.  Abdominal:     General: There is no distension.  Musculoskeletal:        General: No swelling, tenderness or signs of injury. Normal range of motion.     Cervical back: Normal range of motion and neck supple.  Skin:    General: Skin is warm and dry.  Neurological:     Mental Status: She is alert and oriented to person, place, and time.     Cranial Nerves: No cranial nerve deficit.     Sensory: No sensory deficit.     Motor: No abnormal muscle tone.     Coordination: Coordination normal.  Psychiatric:        Mood and Affect: Mood normal.        Behavior: Behavior normal.        Thought Content: Thought content normal.        Judgment: Judgment normal.    ED Results / Procedures / Treatments   Labs (all labs ordered are listed, but only abnormal results are displayed) Labs Reviewed - No data to display  EKG None  Radiology CT Head Wo Contrast  Result Date: 03/14/2021 CLINICAL DATA:  Tripped and fell onto face this morning EXAM: CT HEAD WITHOUT CONTRAST CT CERVICAL SPINE WITHOUT CONTRAST TECHNIQUE: Multidetector CT imaging of the head and cervical spine was performed following the standard protocol without intravenous contrast. Multiplanar CT image reconstructions of the cervical spine were also generated. COMPARISON:  CT head and cervical spine 01/25/2021 FINDINGS: CT HEAD FINDINGS  Brain: Normal ventricular morphology. No midline shift or mass effect. Again identified posterior LEFT temporal lobe infarct which extends into inferior LEFT parietal lobe. No intracranial hemorrhage, mass lesion, or evidence of acute infarction. No extra-axial fluid collections. Vascular: No hyperdense vessels. Skull: Intact Sinuses/Orbits: Clear Other: N/A CT CERVICAL SPINE FINDINGS Alignment: Normal Skull base and vertebrae: Vertebral body heights maintained. Disc space narrowing with endplate spur formation at C5-C6 and C6-C7. Mild scattered facet degenerative changes. No fracture, subluxation, or bone destruction. Soft tissues and spinal canal: Prevertebral soft tissues normal thickness. Atherosclerotic calcifications at carotid bifurcations. Disc levels:  No specific abnormalities Upper chest: Lung apices clear Other: N/A IMPRESSION: Old posterior LEFT temporal lobe/inferior LEFT parietal lobe infarct. No acute intracranial abnormalities. Degenerative disc and facet disease changes of the cervical spine. No acute cervical spine abnormalities. Electronically Signed   By: Ulyses Southward M.D.   On: 03/14/2021 10:08   CT Cervical Spine Wo Contrast  Result Date: 03/14/2021 CLINICAL DATA:  Tripped and fell onto face this morning EXAM: CT HEAD WITHOUT CONTRAST CT CERVICAL SPINE WITHOUT CONTRAST TECHNIQUE: Multidetector CT imaging of the head and cervical spine was performed following the standard protocol without intravenous contrast. Multiplanar CT image reconstructions of the cervical spine were also generated. COMPARISON:  CT head  and cervical spine 01/25/2021 FINDINGS: CT HEAD FINDINGS Brain: Normal ventricular morphology. No midline shift or mass effect. Again identified posterior LEFT temporal lobe infarct which extends into inferior LEFT parietal lobe. No intracranial hemorrhage, mass lesion, or evidence of acute infarction. No extra-axial fluid collections. Vascular: No hyperdense vessels. Skull: Intact  Sinuses/Orbits: Clear Other: N/A CT CERVICAL SPINE FINDINGS Alignment: Normal Skull base and vertebrae: Vertebral body heights maintained. Disc space narrowing with endplate spur formation at C5-C6 and C6-C7. Mild scattered facet degenerative changes. No fracture, subluxation, or bone destruction. Soft tissues and spinal canal: Prevertebral soft tissues normal thickness. Atherosclerotic calcifications at carotid bifurcations. Disc levels:  No specific abnormalities Upper chest: Lung apices clear Other: N/A IMPRESSION: Old posterior LEFT temporal lobe/inferior LEFT parietal lobe infarct. No acute intracranial abnormalities. Degenerative disc and facet disease changes of the cervical spine. No acute cervical spine abnormalities. Electronically Signed   By: Ulyses Southward M.D.   On: 03/14/2021 10:08    Procedures Procedures   Medications Ordered in ED Medications  traMADol (ULTRAM) tablet 50 mg (50 mg Oral Given 03/14/21 0945)    ED Course  I have reviewed the triage vital signs and the nursing notes.  Pertinent labs & imaging results that were available during my care of the patient were reviewed by me and considered in my medical decision making (see chart for details).  Clinical Course as of 03/14/21 1124  Wed Mar 14, 2021  3790 She was offered Tylenol and declined need for analgesia. [EW]  [phone] Patient now states that she would like something for pain, and that Tylenol causes her to have abdominal discomfort.  She does not take narcotics regularly.  I will order tramadol. [EW]    Clinical Course User Index [EW] Mancel Bale, MD   MDM Rules/Calculators/A&P                           Patient Vitals for the past 24 hrs:  BP Temp Temp src Pulse Resp SpO2  03/14/21 0836 136/86 98.4 F (36.9 C) Oral 74 18 97 %    11:21 AM Reevaluation with update and discussion. After initial assessment and treatment, an updated evaluation reveals she reports her pain is better after the tramadol.  Findings  discussed with patient, and her partner who was in the room at this time.  All questions answered. Mancel Bale   Medical Decision Making:  This patient is presenting for evaluation of injury to face, which does require a range of treatment options, and is a complaint that involves a moderate risk of morbidity and mortality. The differential diagnoses include contusion, fracture, spine injury. I decided to review old records, and in summary middle-aged female presenting with mechanical fall, who has a history of intraventricular hemorrhage.  She does not take anticoagulants.  She takes multiple psychiatric, and a diuretic medication.  She takes Keppra apparently as prophylaxis and does not know of having seizures.  She has a history of migraines. I did not require additional historical information from anyone.  Radiologic Tests Ordered, included CT head and cervical spine.  I independently Visualized: Radiograph images, which show no acute injuries    Critical Interventions-clinical evaluation, medication treatment, radiographic imaging, observation and reassessment  After These Interventions, the Patient was reevaluated and was found stable for discharge.  No serious injuries.  Doubt intracranial bleeding, cervical spine fracture or spinal injury.  Very low suspicion for significant facial fracture.  CRITICAL CARE-no Performed by:  Mancel Bale  Nursing Notes Reviewed/ Care Coordinated Applicable Imaging Reviewed Interpretation of Laboratory Data incorporated into ED treatment  The patient appears reasonably screened and/or stabilized for discharge and I doubt any other medical condition or other [facility] requiring further screening, evaluation, or treatment in the ED at this time prior to discharge.  Plan: Home Medications-continue usual; Home Treatments-great advance activity, cryotherapy if needed; return here if the recommended treatment, does not improve the symptoms; Recommended follow  up-PCP, PR     Final Clinical Impression(s) / ED Diagnoses Final diagnoses:  Fall in home, initial encounter  Injury of head, initial encounter  Contusion of face, initial encounter    Rx / DC Orders ED Discharge Orders          Ordered    traMADol (ULTRAM) 50 MG tablet  Every 6 hours PRN        03/14/21 1114             Mancel Bale, MD 03/14/21 1124

## 2021-03-14 NOTE — ED Notes (Signed)
Pt requesting pain meds, provider made aware.  

## 2021-03-14 NOTE — ED Triage Notes (Addendum)
Patient reports tripping over her dog and falling face first on to some wood. Patient is having reconstruction to her bathroom.   Patient reports stroke 2017 and on blood thinners. Patient told to come to ED if she ever falls and states she is at risk for a brain bleed. Patient unable to tell this RN what type of blood thinners she is on.   A/Ox4 Ambulatory in triage.   C/O nose pain and right cheek area.

## 2021-03-14 NOTE — ED Notes (Signed)
Pt ambulates independently, steady gait, husband at bedside and driving pt home. Gave pt cranberry juice to take home w her

## 2021-03-14 NOTE — Discharge Instructions (Addendum)
Use ice on sore areas 3-4 times a day for 2 days.  After that heat can help.  Return here or see your doctor as needed for problems.

## 2021-05-20 IMAGING — CT CT HEAD WITHOUT CONTRAST
3 series · 16 of 47 positions shown, 19 images · non-contrast
Comparison: 03/12/2019

CLINICAL DATA: Fall, history of CVA

EXAM:
CT HEAD WITHOUT CONTRAST
TECHNIQUE: Contiguous axial images were obtained from the base of the skull
through the vertex without intravenous contrast.

[Series 2: head wo · axial · 0.47mm/px · z∈[-227,-102]mm · 10 of 31 slices shown, 13 images]
[im 3/31  brain]
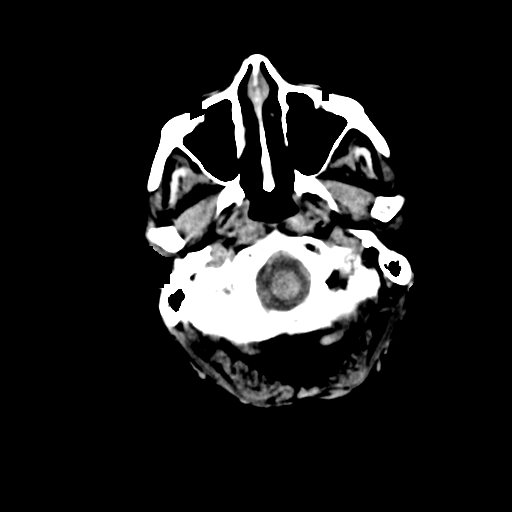
[im 3/31  bone]
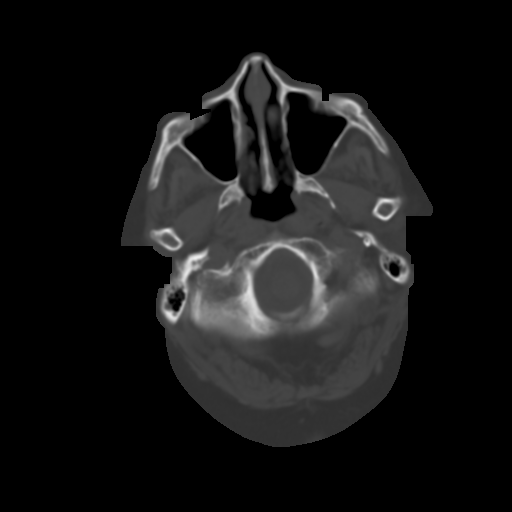
[im 6/31  brain]
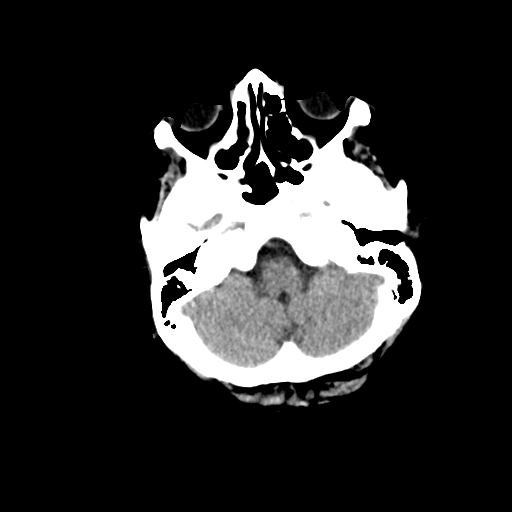
[im 9/31  brain]
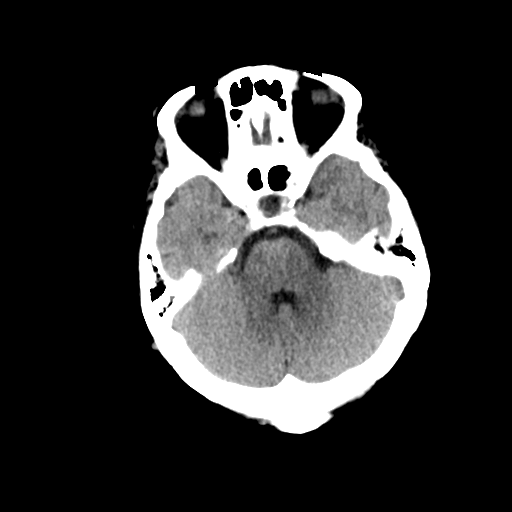
[im 11/31  brain]
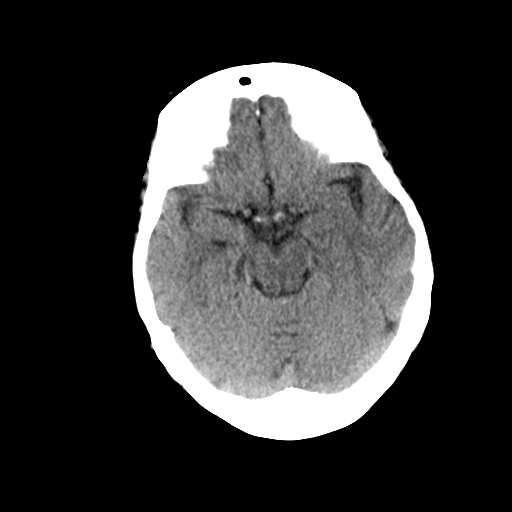
[im 14/31  brain]
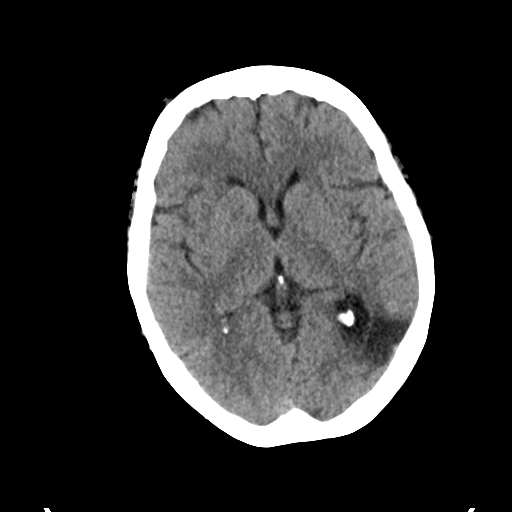
[im 14/31  bone]
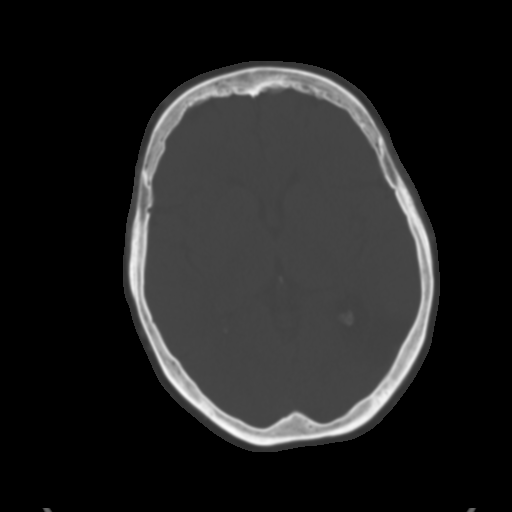
[im 17/31  brain]
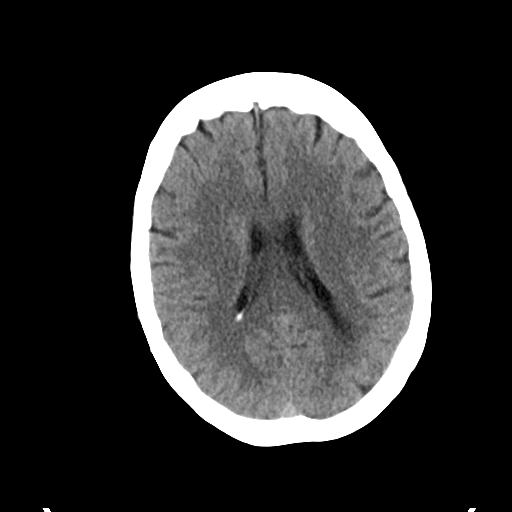
[im 20/31  brain]
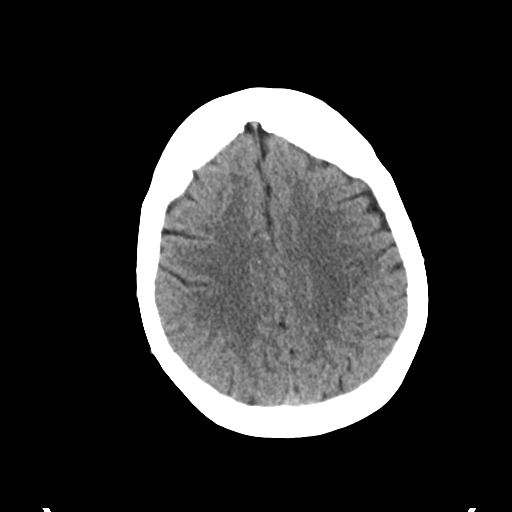
[im 23/31  brain]
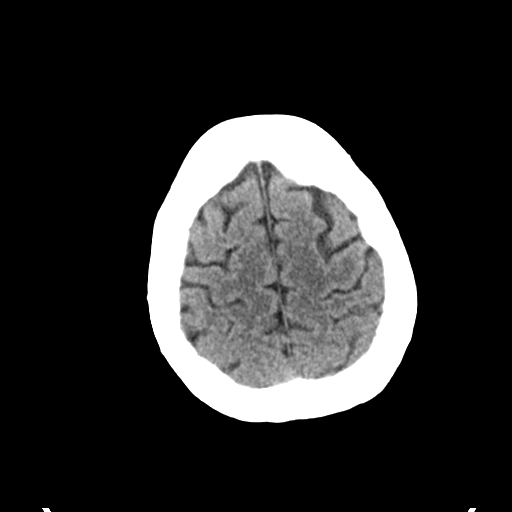
[im 25/31  brain]
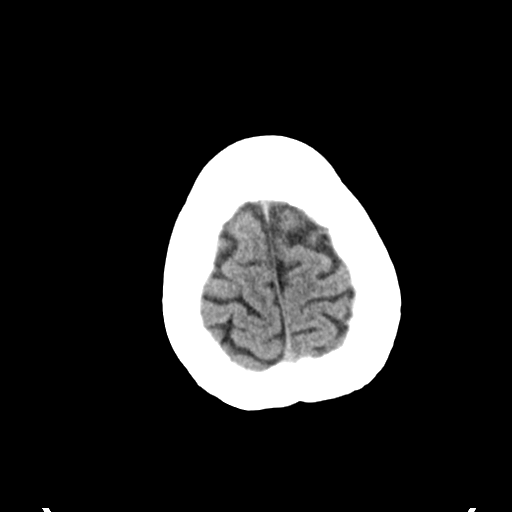
[im 25/31  bone]
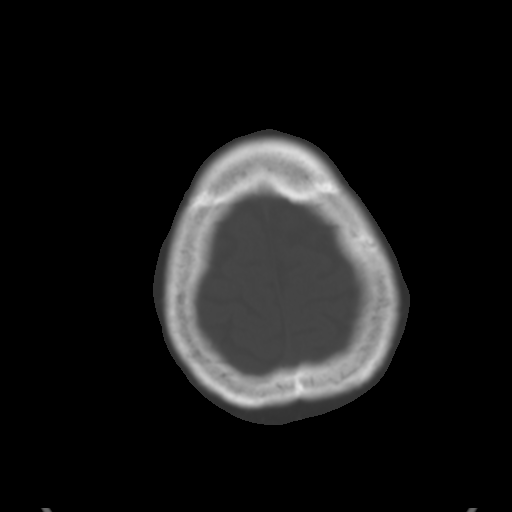
[im 28/31  brain]
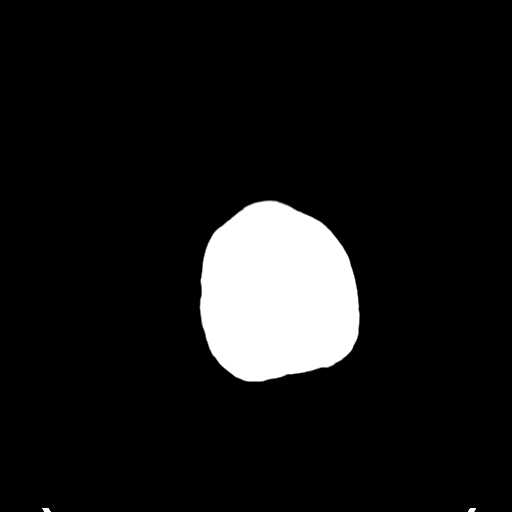

[Series 4: coronal soft tissue · coronal · 0.28mm/px · 3 of 62 slices shown]
[im 21/62  brain]
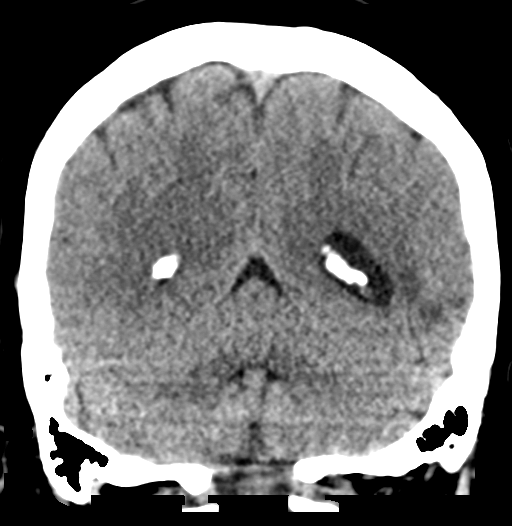
[im 28/62  brain]
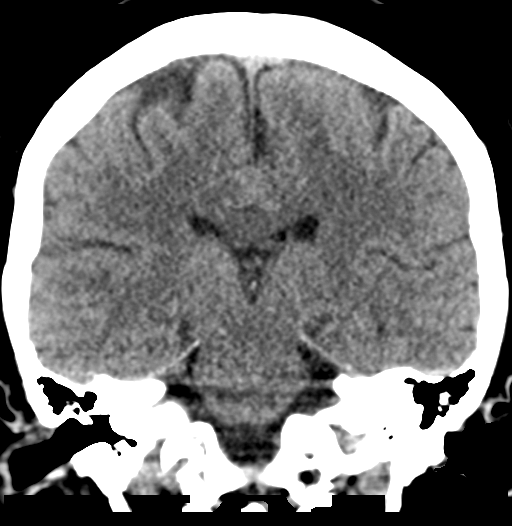
[im 34/62  brain]
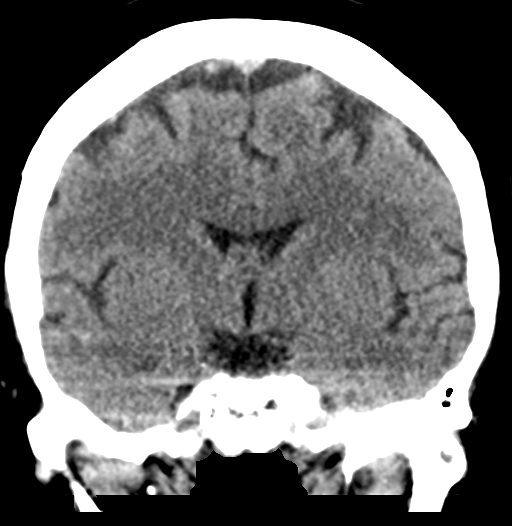

[Series 5: sagittal soft tissue · sagittal · 0.29mm/px · 3 of 49 slices shown]
[im 17/49  brain]
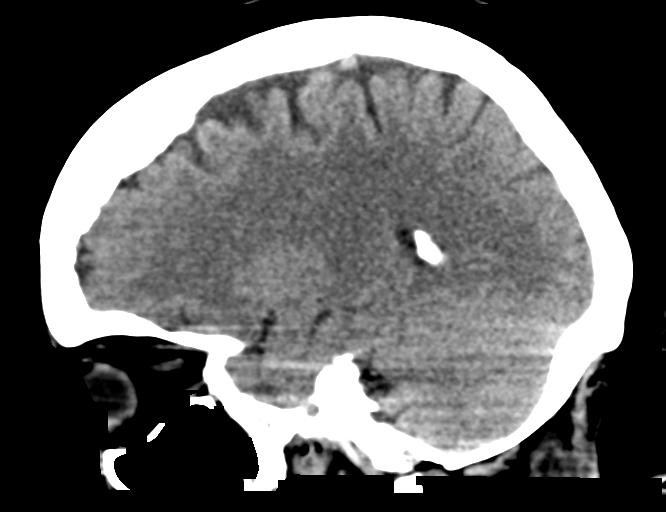
[im 25/49  brain]
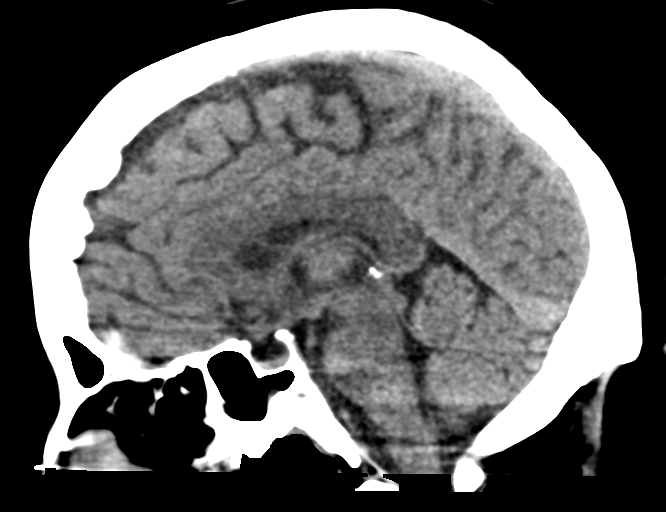
[im 33/49  brain]
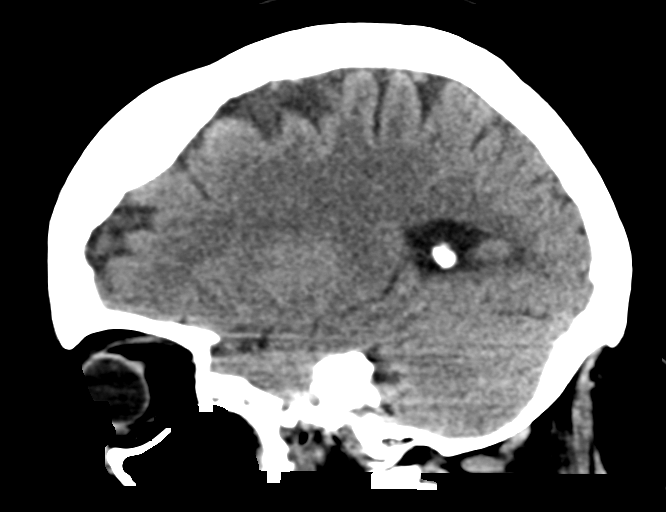

[16 of 47 positions shown; findings below may reference images not displayed]

FINDINGS: Brain: No evidence of acute infarction, hemorrhage, hydrocephalus,
extra-axial collection or mass lesion/mass effect.

Encephalomalacic changes related to old left temporo-occipital
infarct.

Vascular: No hyperdense vessel or unexpected calcification.

Skull: Normal. Negative for fracture or focal lesion.

Sinuses/Orbits: The visualized paranasal sinuses are essentially
clear. The mastoid air cells are unopacified.

Other: None.
IMPRESSION: No evidence of acute intracranial abnormality.

Old left temporo-occipital infarct.

## 2021-05-21 IMAGING — CT CT RENAL STONE PROTOCOL
2 of 3 series · 16 of 46 positions shown, 18 images · non-contrast
Comparison: CT of the abdomen pelvis dated 02/06/2019

CLINICAL DATA: 53-year-old female with left back pain and flank
pain. Concern for kidney stone.

EXAM:
CT ABDOMEN AND PELVIS WITHOUT CONTRAST
TECHNIQUE: Multidetector CT imaging of the abdomen and pelvis was performed
following the standard protocol without IV contrast.

[Series 3: lung bases · axial · 0.75mm/px · z∈[+1510,+1576]mm · 13 of 39 slices shown, 15 images]
[im 3/39  soft-tissue]
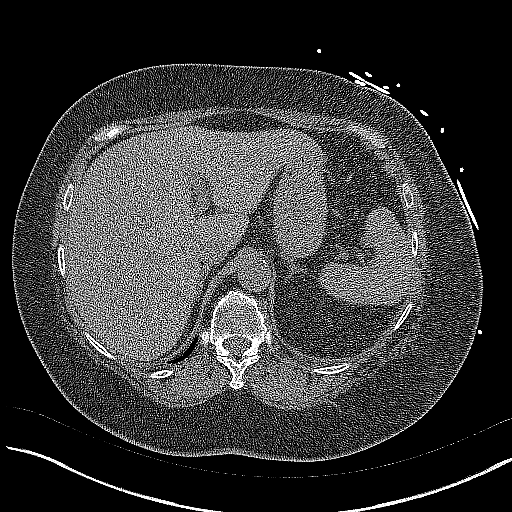
[im 3/39  bone]
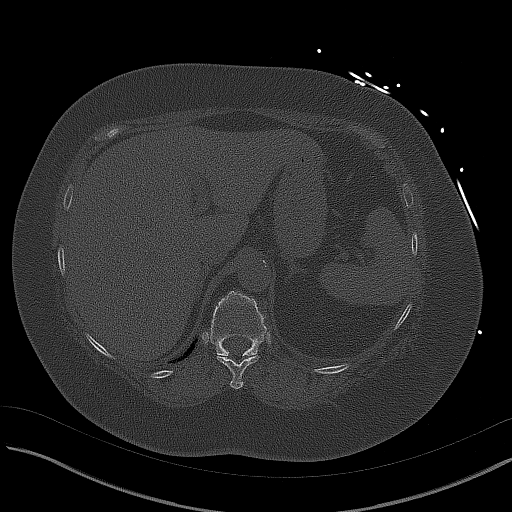
[im 5/39  soft-tissue]
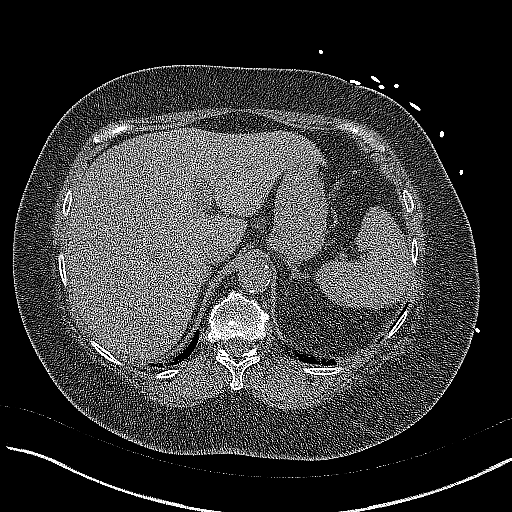
[im 8/39  soft-tissue]
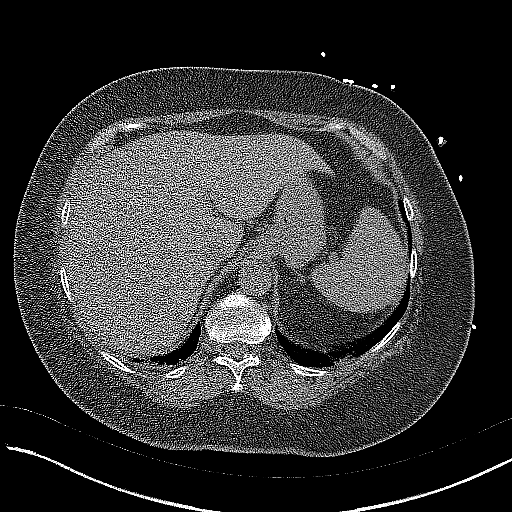
[im 12/39  soft-tissue]
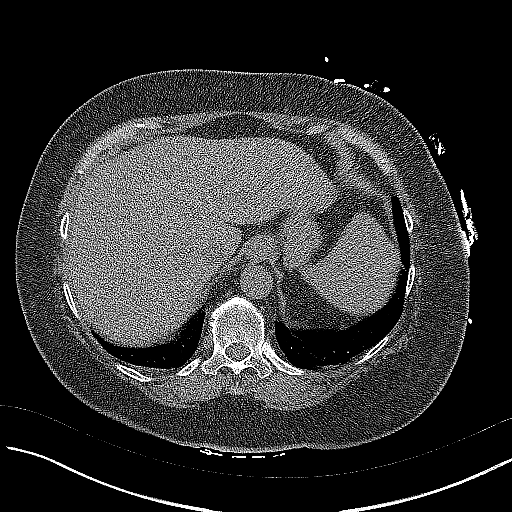
[im 14/39  soft-tissue]
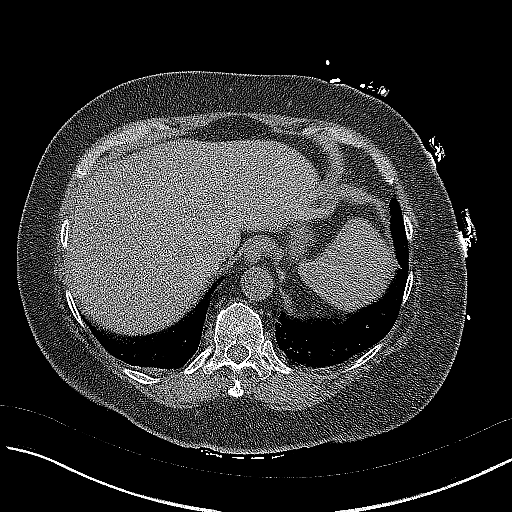
[im 16/39  soft-tissue]
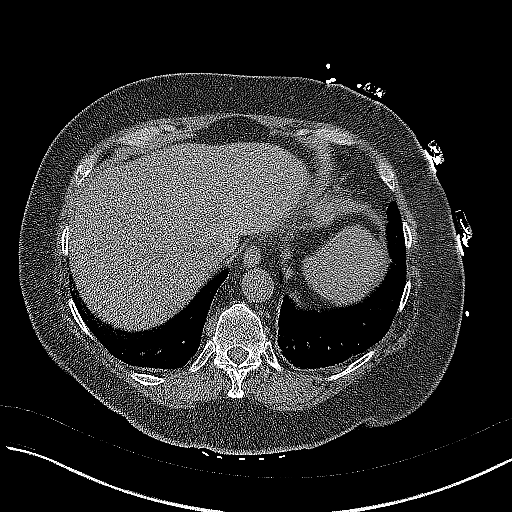
[im 20/39  soft-tissue]
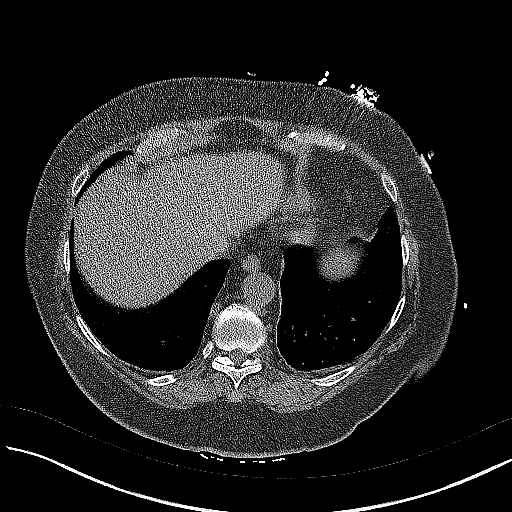
[im 23/39  soft-tissue]
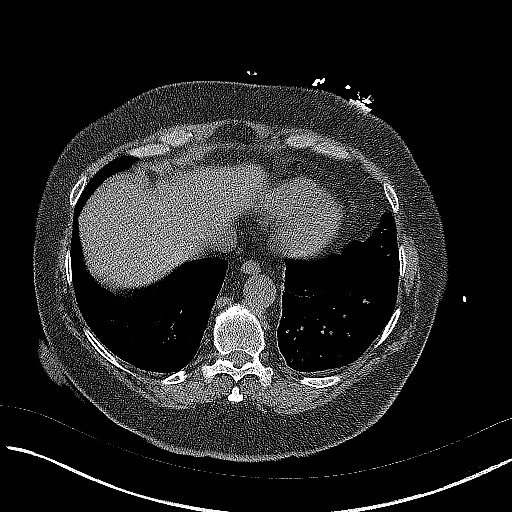
[im 25/39  soft-tissue]
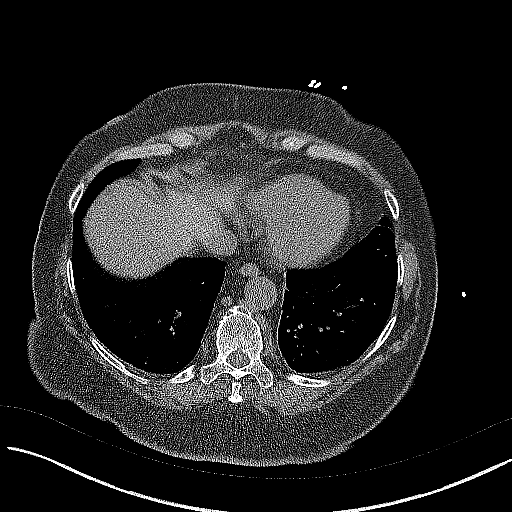
[im 25/39  bone]
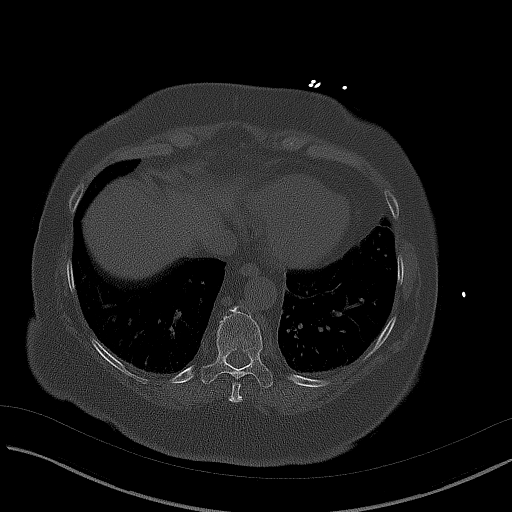
[im 27/39  soft-tissue]
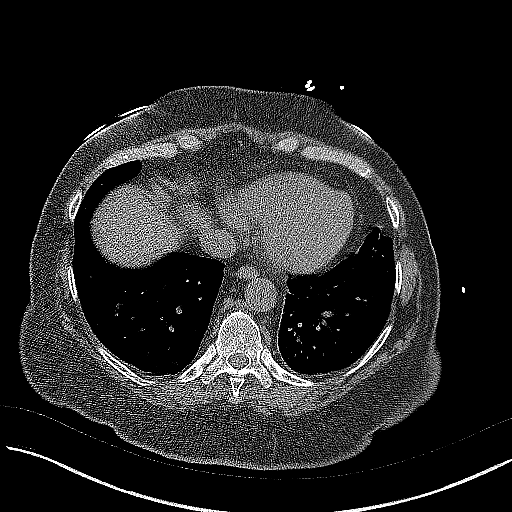
[im 31/39  soft-tissue]
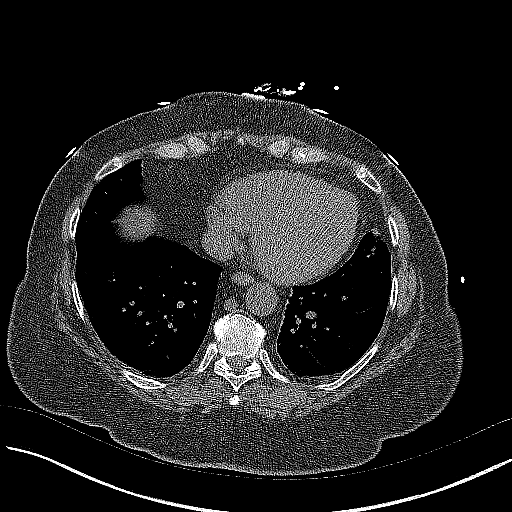
[im 34/39  soft-tissue]
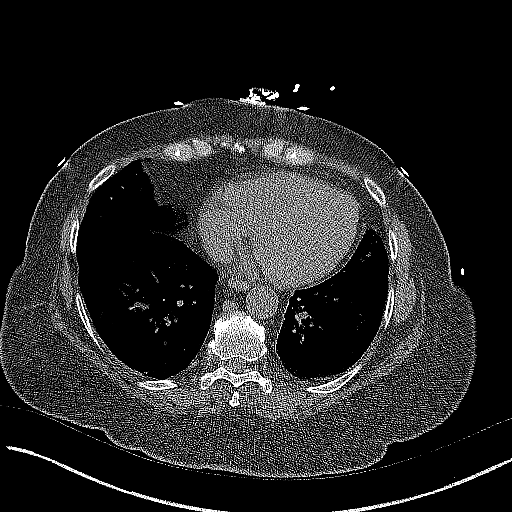
[im 36/39  soft-tissue]
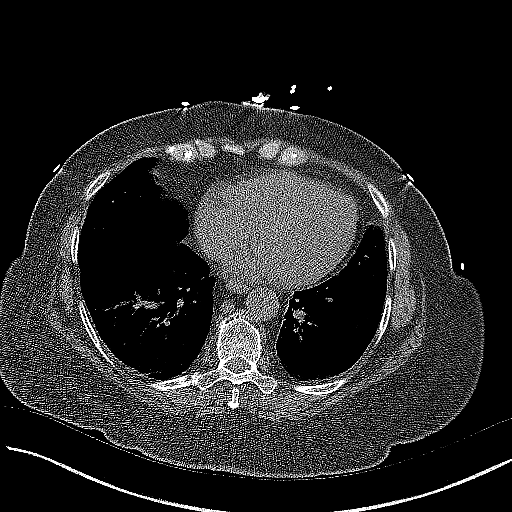

[Series 4: coronal · coronal · 0.70mm/px · 3 of 144 slices shown]
[im 48/144  soft-tissue]
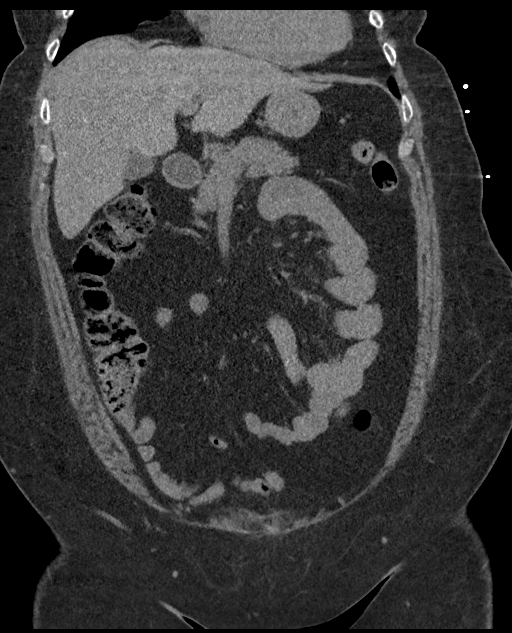
[im 64/144  soft-tissue]
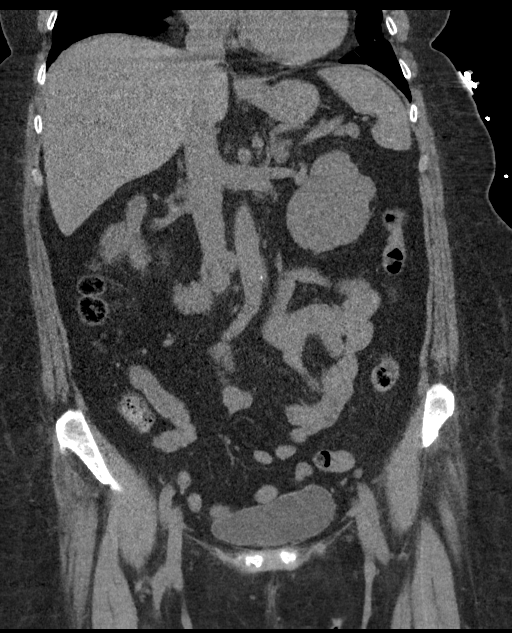
[im 80/144  soft-tissue]
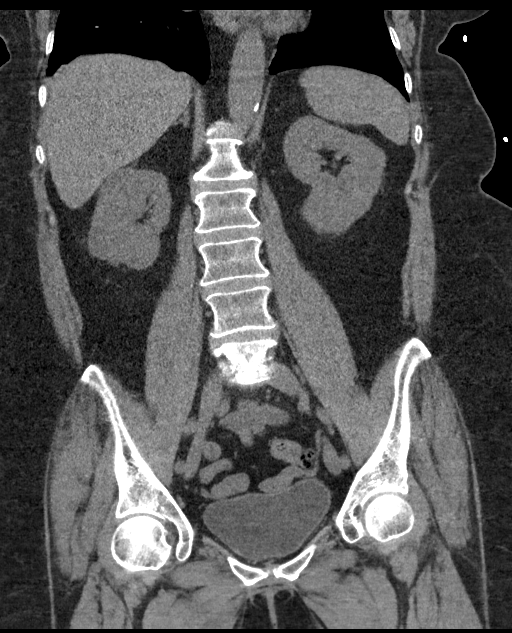

[16 of 46 positions shown; findings below may reference images not displayed]

FINDINGS: Evaluation of this exam is limited in the absence of intravenous
contrast.

Lower chest: The visualized lung bases are clear.

No intra-abdominal free air or free fluid.

Hepatobiliary: No focal liver abnormality is seen. No gallstones,
gallbladder wall thickening, or biliary dilatation.

Pancreas: Unremarkable. No pancreatic ductal dilatation or
surrounding inflammatory changes.

Spleen: Normal in size without focal abnormality.

Adrenals/Urinary Tract: The adrenal glands are unremarkable. Areas
of parenchymal atrophy and cortical infarct primarily involving the
inferior pole of the right kidney similar to prior studies dating
back to 3436. There are multiple nonobstructing bilateral renal
calculi measure up to 3-4 mm. There is no hydronephrosis on either
side. The visualized ureters and urinary bladder appear
unremarkable.

Stomach/Bowel: There is sigmoid diverticulosis without active
inflammatory changes. There is no bowel obstruction or active
inflammation. The appendix is normal.

Vascular/Lymphatic: Mild atherosclerotic calcification of the aorta.
The IVC is unremarkable. No portal venous gas. There is no
adenopathy.

Reproductive: Hysterectomy. No pelvic mass.

Other: None

Musculoskeletal: Degenerative changes primarily at L5-S1. No acute
osseous pathology.
IMPRESSION: 1. Small nonobstructing bilateral renal calculi. No hydronephrosis
or obstructing stone.
2. Right renal inferior pole parenchyma atrophy and cortical
scarring.
3. Sigmoid diverticulosis. No bowel obstruction or active
inflammation. Normal appendix.
4. Aortic Atherosclerosis (NQGDA-C88.8).

## 2021-06-16 IMAGING — CT CT HEAD W/O CM
3 series · 15 of 47 positions shown, 18 images · non-contrast
Comparison: 04/20/2019

CLINICAL DATA: Headache

EXAM:
CT HEAD WITHOUT CONTRAST
TECHNIQUE: Contiguous axial images were obtained from the base of the skull
through the vertex without intravenous contrast.

[Series 2: head wo · axial · 0.43mm/px · z∈[-203,-78]mm · 9 of 31 slices shown, 12 images]
[im 3/31  brain]
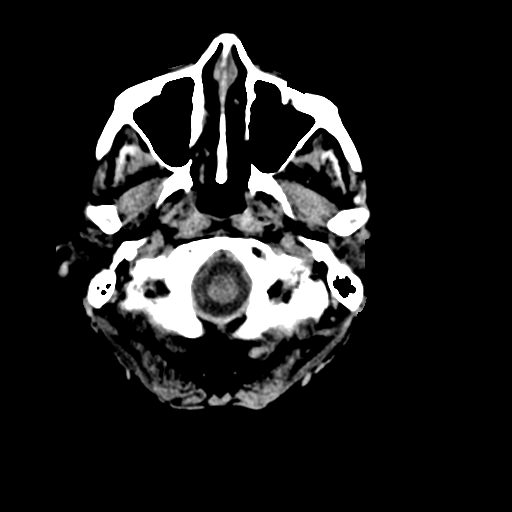
[im 3/31  bone]
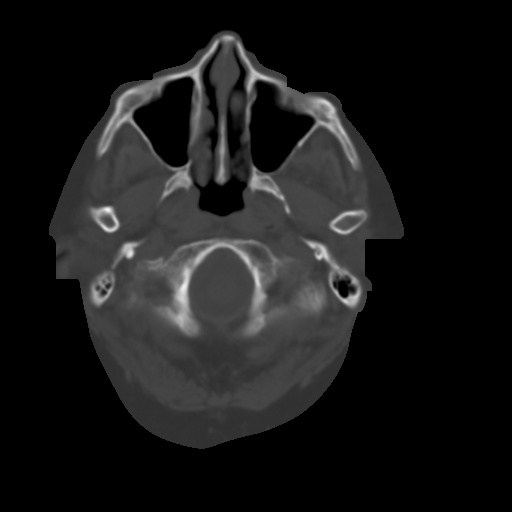
[im 6/31  brain]
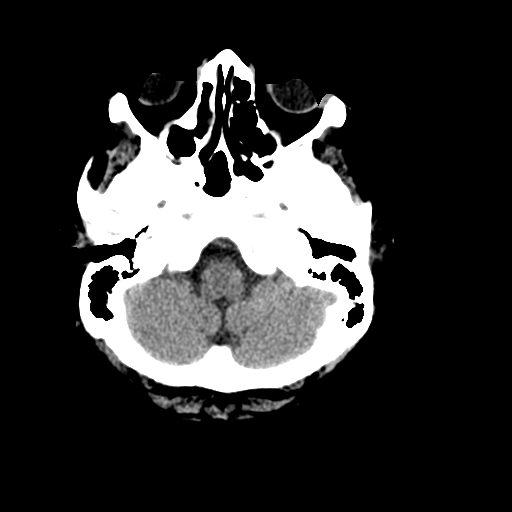
[im 9/31  brain]
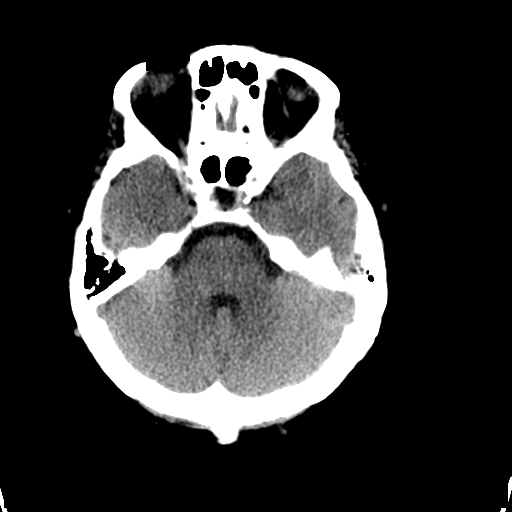
[im 12/31  brain]
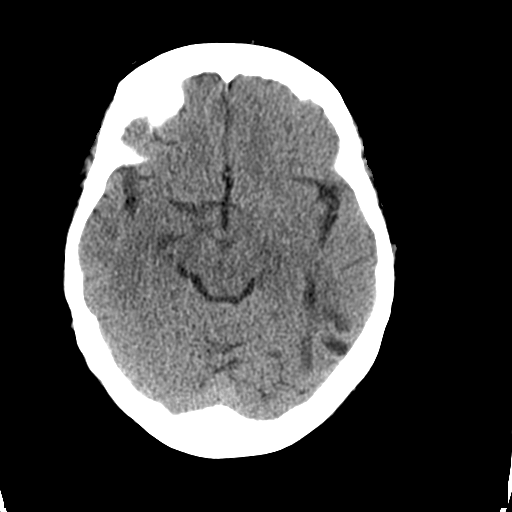
[im 16/31  brain]
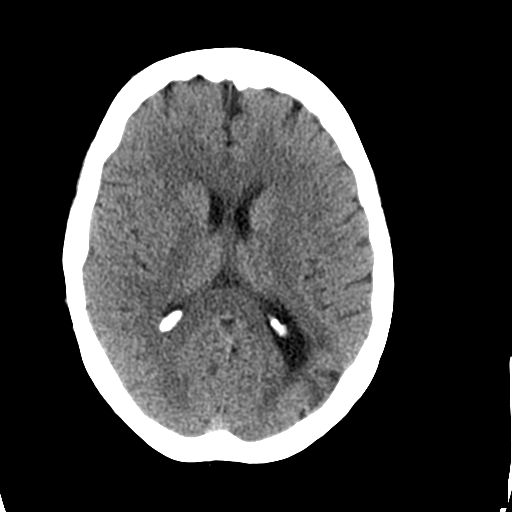
[im 16/31  bone]
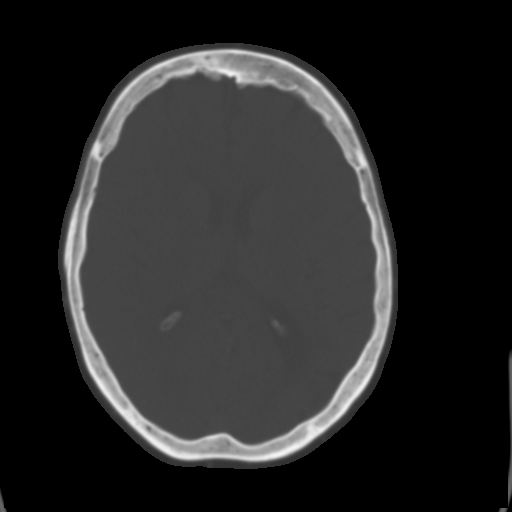
[im 19/31  brain]
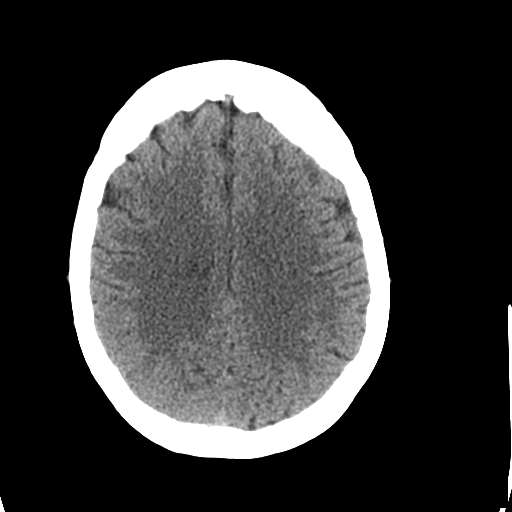
[im 22/31  brain]
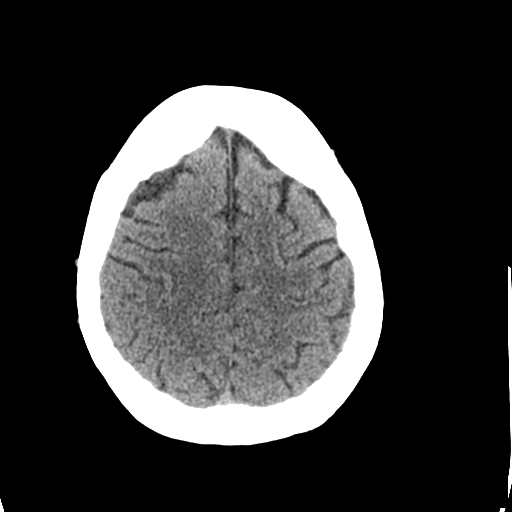
[im 25/31  brain]
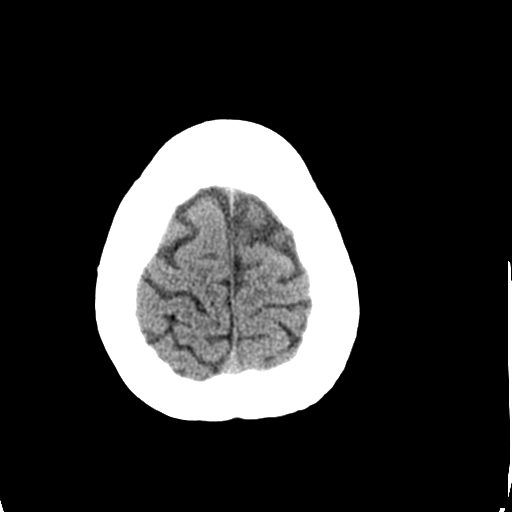
[im 28/31  brain]
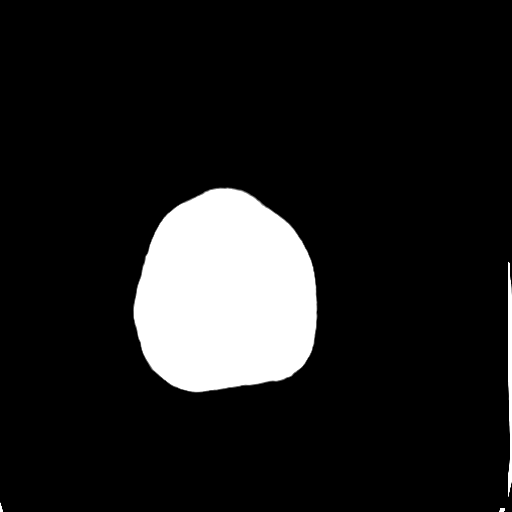
[im 28/31  bone]
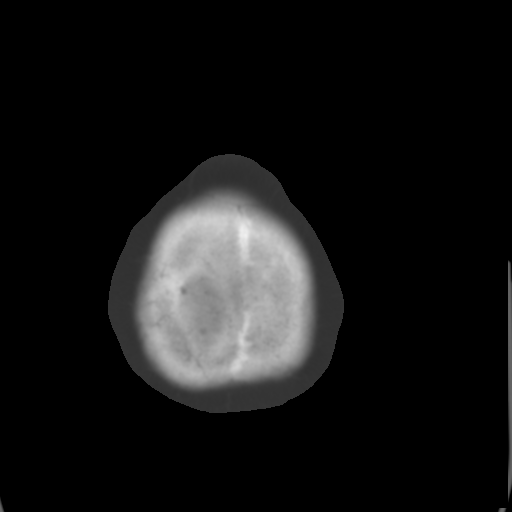

[Series 4: coronal soft tissue · coronal · 0.32mm/px · 3 of 71 slices shown]
[im 24/71  brain]
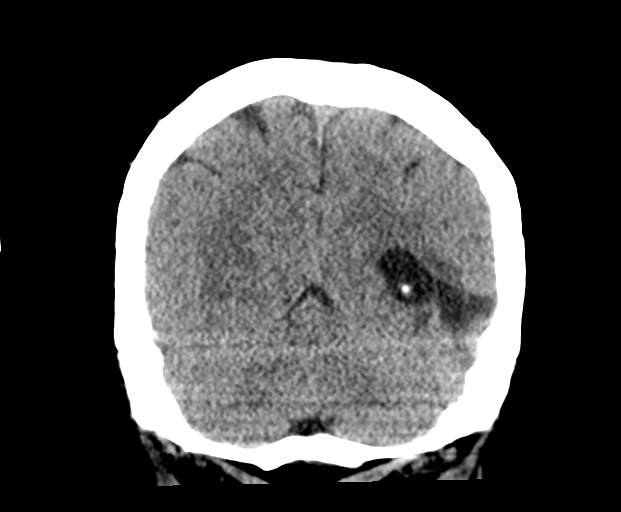
[im 32/71  brain]
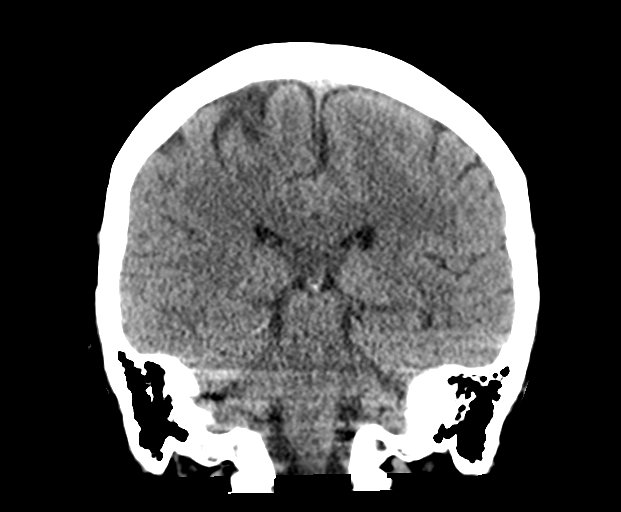
[im 39/71  brain]
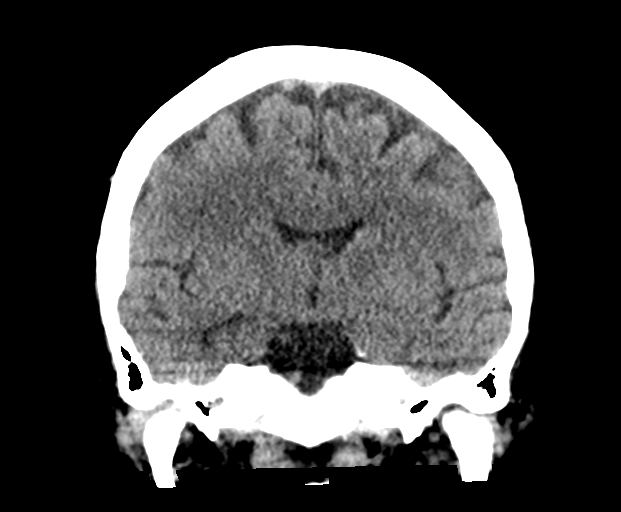

[Series 5: sagittal soft tissue · sagittal · 0.32mm/px · 3 of 60 slices shown]
[im 20/60  brain]
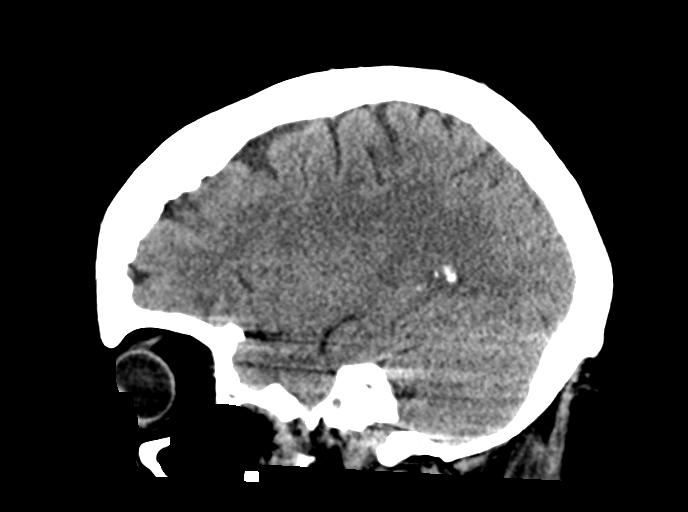
[im 30/60  brain]
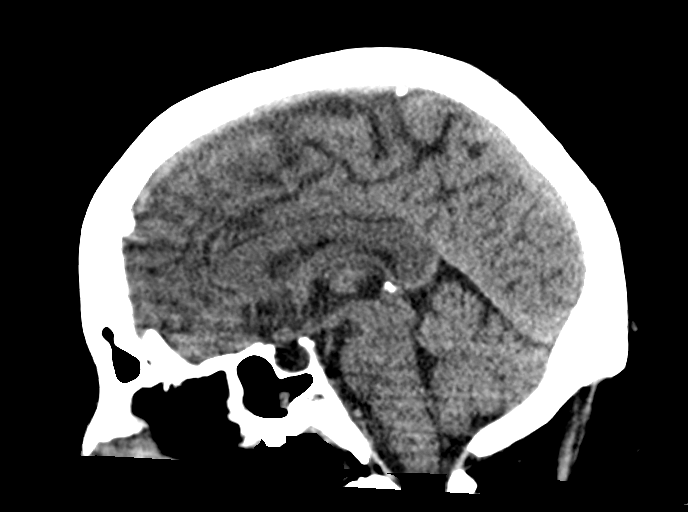
[im 40/60  brain]
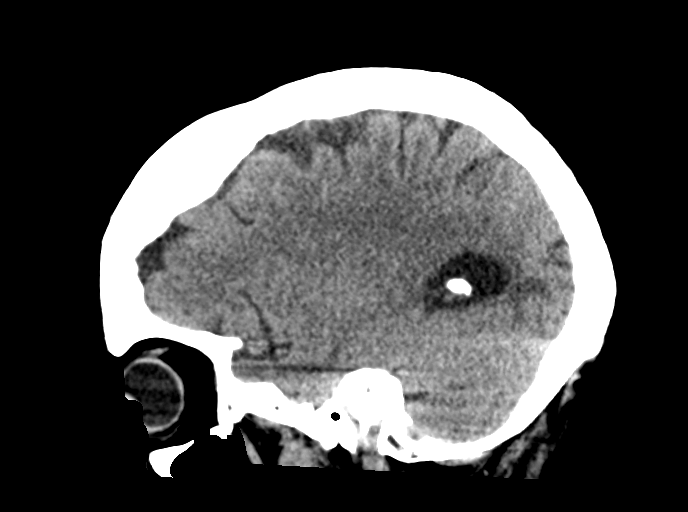

[15 of 47 positions shown; findings below may reference images not displayed]

FINDINGS: Brain: No evidence of acute infarction, hemorrhage, hydrocephalus,
extra-axial collection or mass lesion/mass effect. Stable
encephalomalacia related to prior left temporo-occipital infarct.

Vascular: Mild atherosclerotic calcifications involving the large
vessels of the skull base. No unexpected hyperdense vessel.

Skull: Normal. Negative for fracture or focal lesion.

Sinuses/Orbits: Scattered ethmoid sinus mucosal thickening,
unchanged. Remaining paranasal sinuses and mastoid air cells are
clear. Orbital structures intact.

Other: None.
IMPRESSION: 1. No acute intracranial findings.
2. Chronic left temporo-occipital infarct.

## 2021-06-20 IMAGING — CR DG ANKLE COMPLETE 3+V*L*
3 series · 3 of 3 positions shown · non-contrast
Comparison: None.

CLINICAL DATA: Trip and fall

EXAM:
LEFT FOOT - COMPLETE 3+ VIEW; LEFT ANKLE COMPLETE - 3+ VIEW

[x ankle ap left]
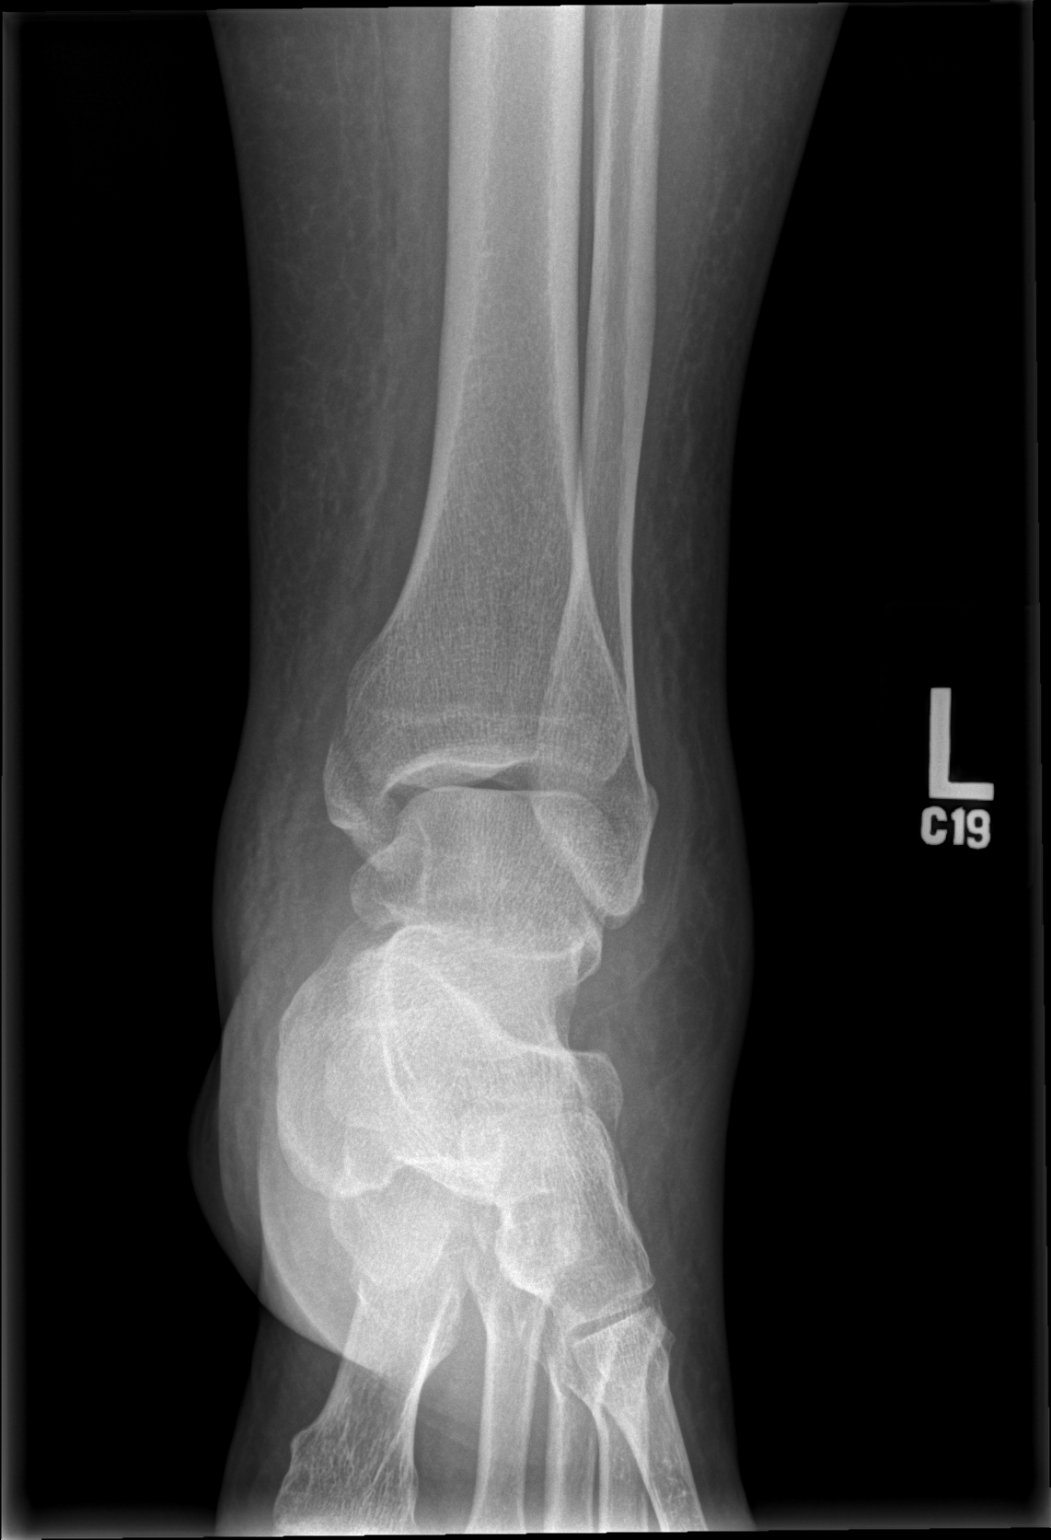

[x ankle obl left]
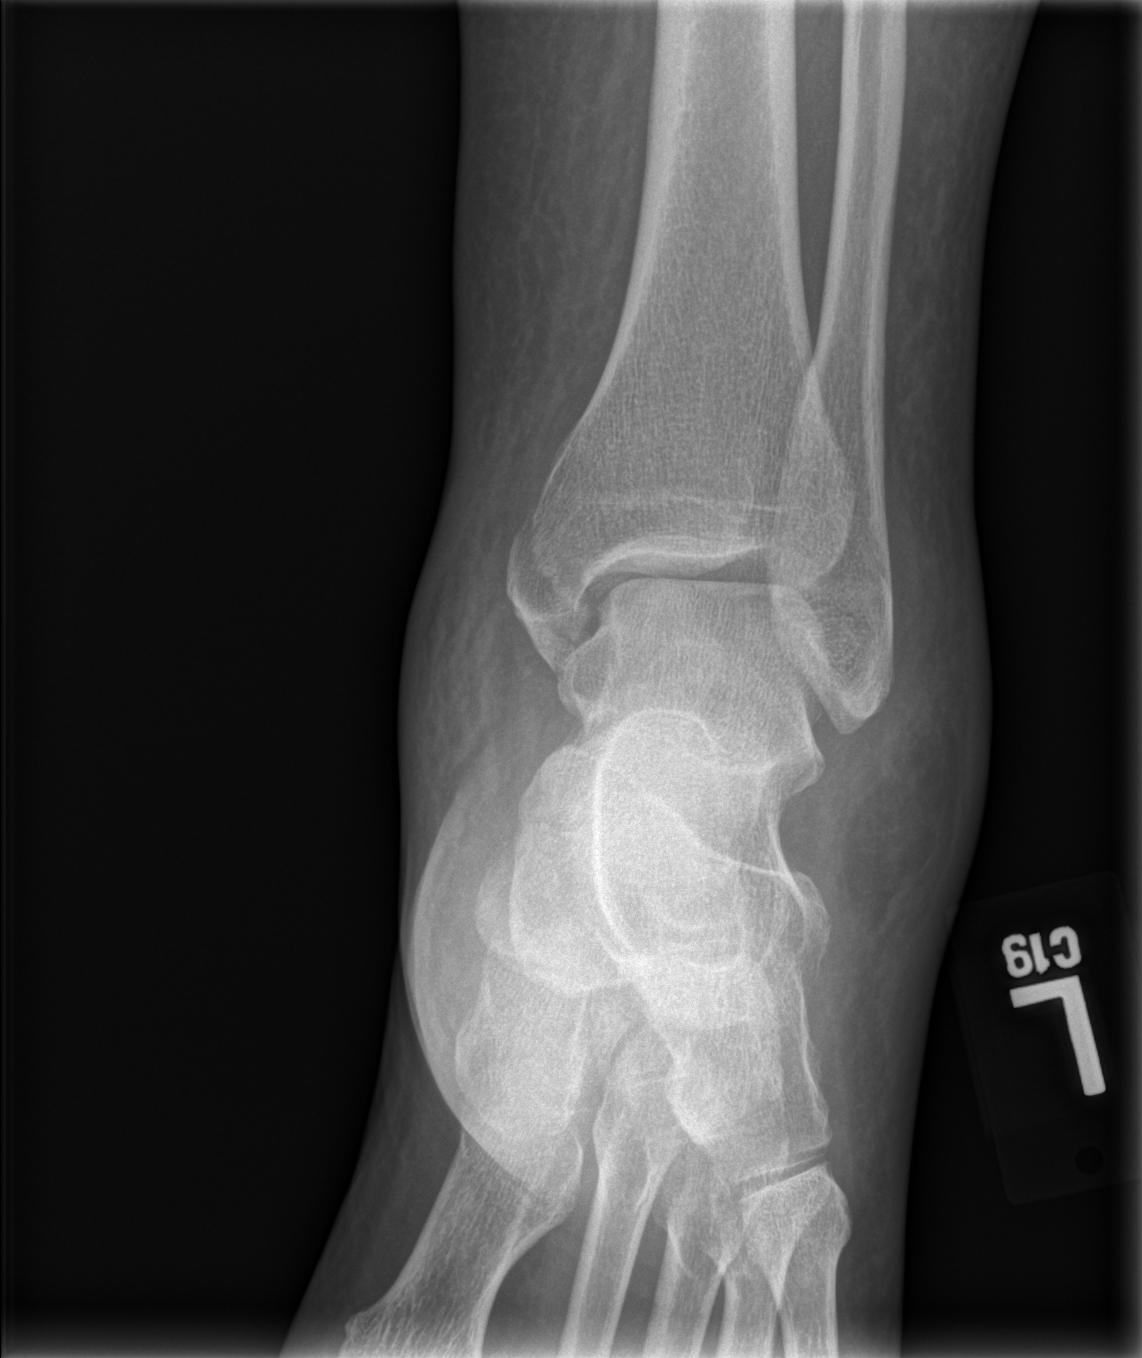

[x ankle lat left]
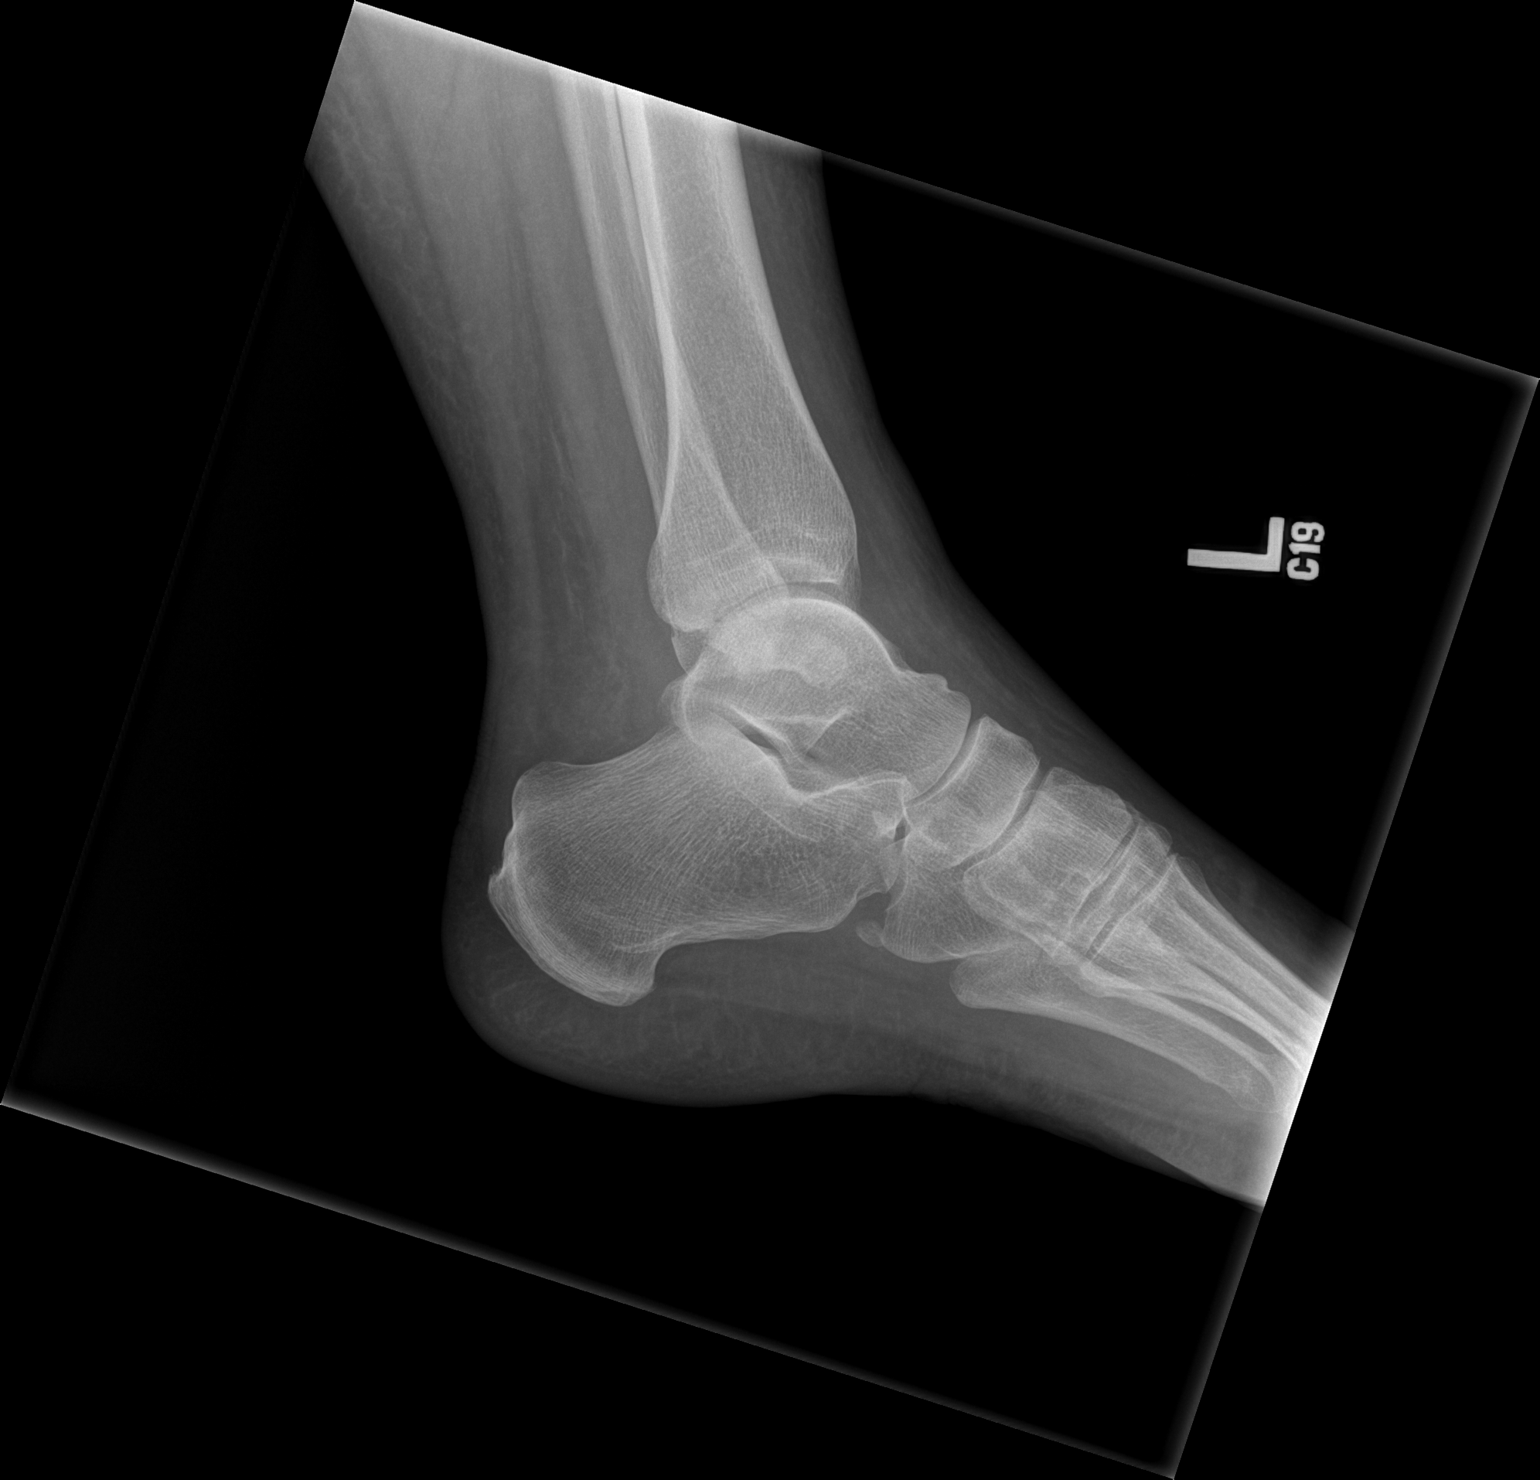

[3 of 3 positions shown; findings below may reference images not displayed]

FINDINGS: There is a mildly displaced fracture of the left medial malleolus.
The lateral and posterior malleoli are intact. Diffuse soft tissue
edema about the ankle. The joint spaces are generally well
preserved. There appears to be at least one periarticular erosion,
of the lateral aspect of the fifth proximal phalanx about the
proximal interphalangeal joint.
IMPRESSION: 1. There is a mildly displaced fracture of the left medial
malleolus. The lateral and posterior malleoli are intact.

2.  No fracture or dislocation of the left foot.

3. There appears to be at least one periarticular erosion, of the
lateral aspect of the fifth proximal phalanx about the proximal
interphalangeal joint. Findings suggest gouty arthropathy. Correlate
with clinical history.

## 2021-06-20 IMAGING — CR DG FOOT COMPLETE 3+V*L*
3 series · 3 of 3 positions shown · non-contrast
Comparison: None.

CLINICAL DATA: Trip and fall

EXAM:
LEFT FOOT - COMPLETE 3+ VIEW; LEFT ANKLE COMPLETE - 3+ VIEW

[x foot ap left]
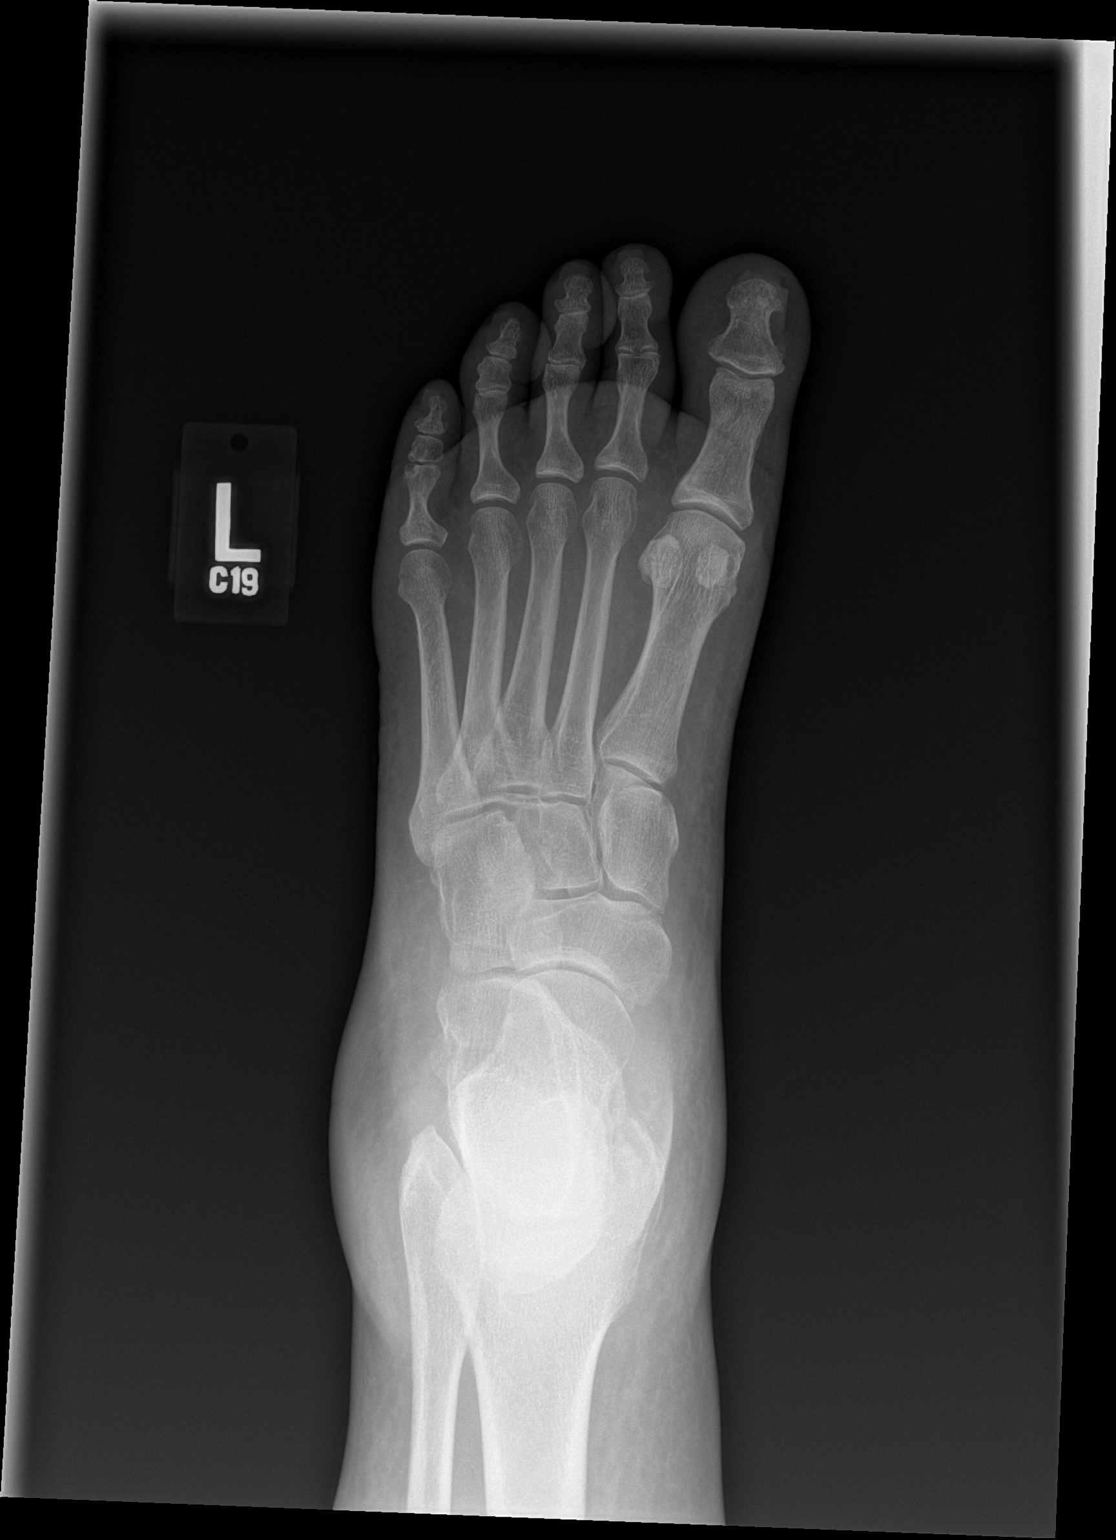

[x foot obl left]
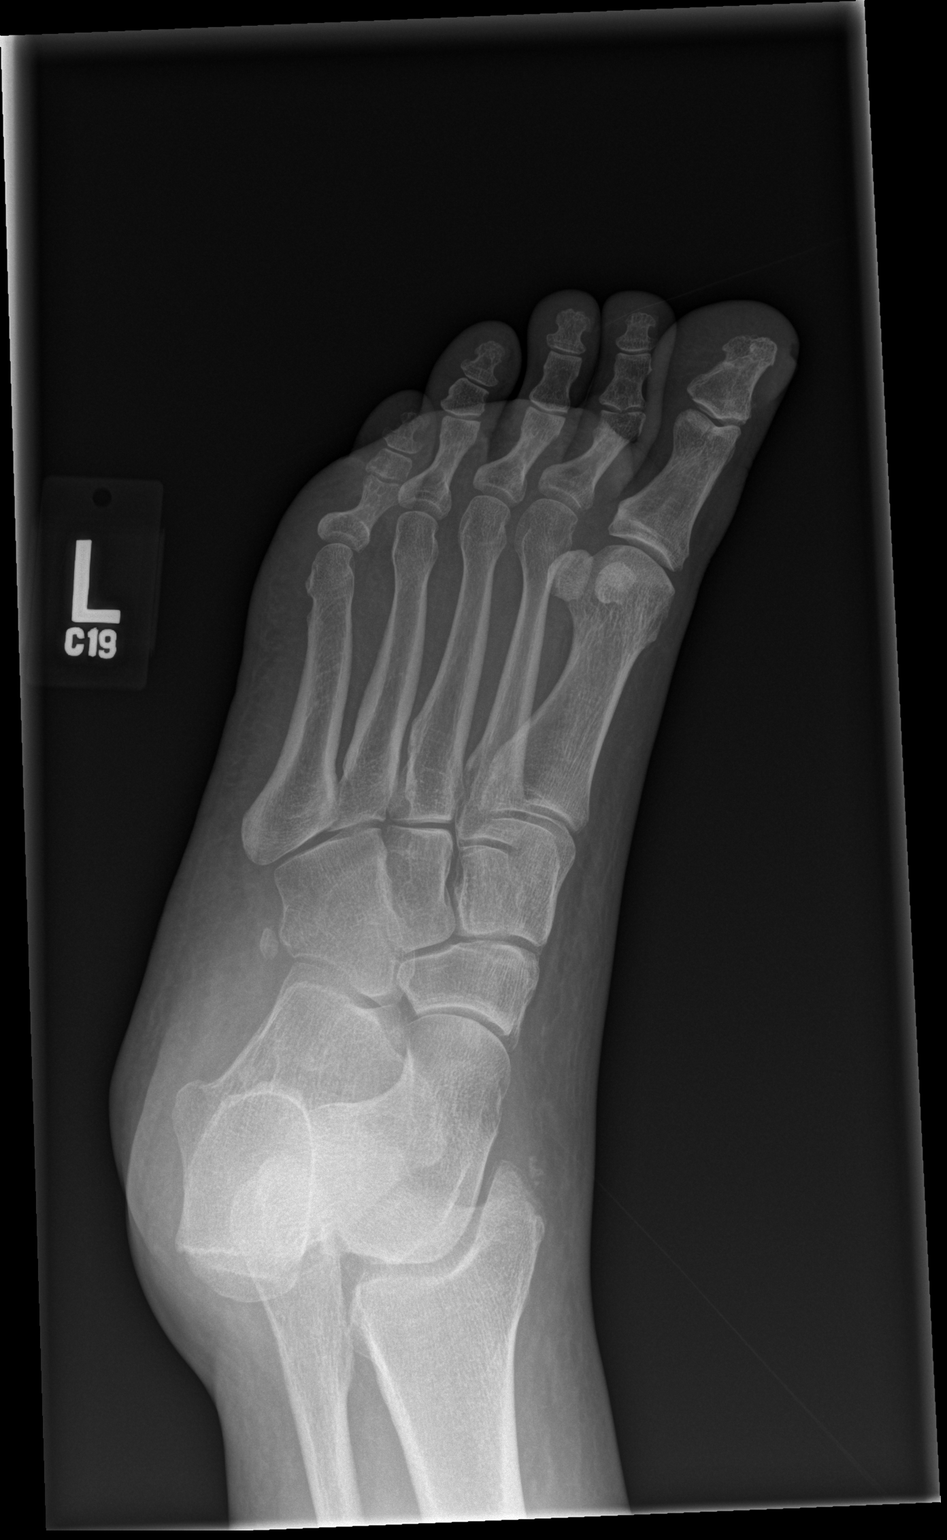

[x foot lat left]
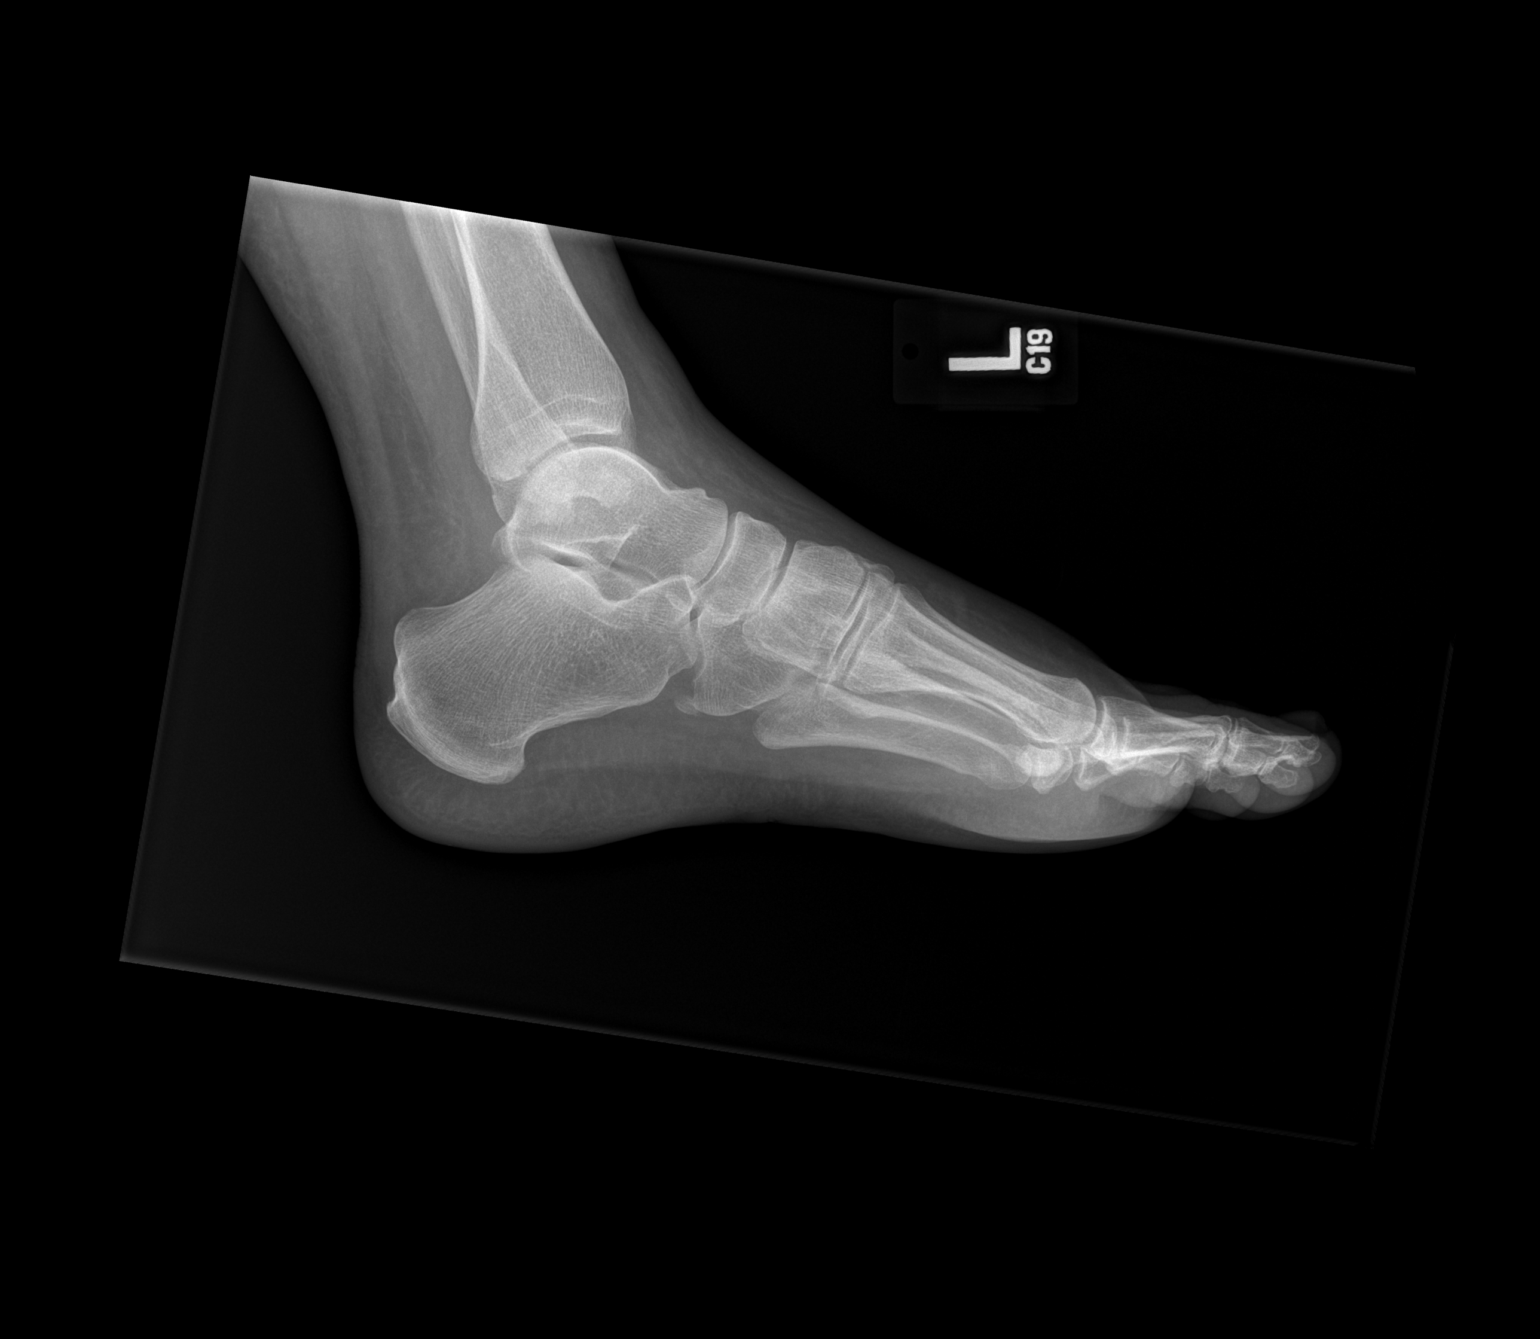

[3 of 3 positions shown; findings below may reference images not displayed]

FINDINGS: There is a mildly displaced fracture of the left medial malleolus.
The lateral and posterior malleoli are intact. Diffuse soft tissue
edema about the ankle. The joint spaces are generally well
preserved. There appears to be at least one periarticular erosion,
of the lateral aspect of the fifth proximal phalanx about the
proximal interphalangeal joint.
IMPRESSION: 1. There is a mildly displaced fracture of the left medial
malleolus. The lateral and posterior malleoli are intact.

2.  No fracture or dislocation of the left foot.

3. There appears to be at least one periarticular erosion, of the
lateral aspect of the fifth proximal phalanx about the proximal
interphalangeal joint. Findings suggest gouty arthropathy. Correlate
with clinical history.

## 2021-06-21 ENCOUNTER — Encounter (HOSPITAL_COMMUNITY): Payer: Self-pay

## 2021-06-21 ENCOUNTER — Emergency Department (HOSPITAL_COMMUNITY): Payer: Medicaid Other

## 2021-06-21 ENCOUNTER — Other Ambulatory Visit: Payer: Self-pay

## 2021-06-21 ENCOUNTER — Emergency Department (HOSPITAL_COMMUNITY)
Admission: EM | Admit: 2021-06-21 | Discharge: 2021-06-21 | Disposition: A | Discharge status: Home / Self Care | Payer: Medicaid Other | Attending: Emergency Medicine | Admitting: Emergency Medicine

## 2021-06-21 DIAGNOSIS — W01198A Fall on same level from slipping, tripping and stumbling with subsequent striking against other object, initial encounter: Secondary | ICD-10-CM | POA: Insufficient documentation

## 2021-06-21 DIAGNOSIS — R0789 Other chest pain: Secondary | ICD-10-CM | POA: Diagnosis not present

## 2021-06-21 DIAGNOSIS — F17210 Nicotine dependence, cigarettes, uncomplicated: Secondary | ICD-10-CM | POA: Insufficient documentation

## 2021-06-21 DIAGNOSIS — Z7984 Long term (current) use of oral hypoglycemic drugs: Secondary | ICD-10-CM | POA: Insufficient documentation

## 2021-06-21 DIAGNOSIS — Z7902 Long term (current) use of antithrombotics/antiplatelets: Secondary | ICD-10-CM | POA: Diagnosis not present

## 2021-06-21 DIAGNOSIS — W19XXXA Unspecified fall, initial encounter: Secondary | ICD-10-CM

## 2021-06-21 DIAGNOSIS — Z79899 Other long term (current) drug therapy: Secondary | ICD-10-CM | POA: Diagnosis not present

## 2021-06-21 DIAGNOSIS — R519 Headache, unspecified: Secondary | ICD-10-CM | POA: Diagnosis present

## 2021-06-21 DIAGNOSIS — M25551 Pain in right hip: Secondary | ICD-10-CM | POA: Diagnosis not present

## 2021-06-21 DIAGNOSIS — I10 Essential (primary) hypertension: Secondary | ICD-10-CM | POA: Insufficient documentation

## 2021-06-21 DIAGNOSIS — E119 Type 2 diabetes mellitus without complications: Secondary | ICD-10-CM | POA: Diagnosis not present

## 2021-06-21 DIAGNOSIS — J449 Chronic obstructive pulmonary disease, unspecified: Secondary | ICD-10-CM | POA: Insufficient documentation

## 2021-06-21 DIAGNOSIS — M542 Cervicalgia: Secondary | ICD-10-CM | POA: Diagnosis not present

## 2021-06-21 LAB — CBC WITH DIFFERENTIAL/PLATELET
Abs Immature Granulocytes: 0.02 10*3/uL (ref 0.00–0.07)
Basophils Absolute: 0 10*3/uL (ref 0.0–0.1)
Basophils Relative: 1 %
Eosinophils Absolute: 0.1 10*3/uL (ref 0.0–0.5)
Eosinophils Relative: 1 %
HCT: 48.2 % — ABNORMAL HIGH (ref 36.0–46.0)
Hemoglobin: 15.2 g/dL — ABNORMAL HIGH (ref 12.0–15.0)
Immature Granulocytes: 0 %
Lymphocytes Relative: 32 %
Lymphs Abs: 1.9 10*3/uL (ref 0.7–4.0)
MCH: 31.5 pg (ref 26.0–34.0)
MCHC: 31.5 g/dL (ref 30.0–36.0)
MCV: 99.8 fL (ref 80.0–100.0)
Monocytes Absolute: 0.6 10*3/uL (ref 0.1–1.0)
Monocytes Relative: 11 %
Neutro Abs: 3.2 10*3/uL (ref 1.7–7.7)
Neutrophils Relative %: 55 %
Platelets: 157 10*3/uL (ref 150–400)
RBC: 4.83 MIL/uL (ref 3.87–5.11)
RDW: 13.8 % (ref 11.5–15.5)
WBC: 5.8 10*3/uL (ref 4.0–10.5)
nRBC: 0 % (ref 0.0–0.2)

## 2021-06-21 LAB — COMPREHENSIVE METABOLIC PANEL
ALT: 28 U/L (ref 0–44)
AST: 28 U/L (ref 15–41)
Albumin: 4.7 g/dL (ref 3.5–5.0)
Alkaline Phosphatase: 105 U/L (ref 38–126)
Anion gap: 11 (ref 5–15)
BUN: 17 mg/dL (ref 6–20)
CO2: 28 mmol/L (ref 22–32)
Calcium: 9.8 mg/dL (ref 8.9–10.3)
Chloride: 100 mmol/L (ref 98–111)
Creatinine, Ser: 0.89 mg/dL (ref 0.44–1.00)
GFR, Estimated: >60 mL/min (ref >60)
Glucose, Bld: 147 mg/dL — ABNORMAL HIGH (ref 70–99)
Potassium: 3.6 mmol/L (ref 3.5–5.1)
Sodium: 139 mmol/L (ref 135–145)
Total Bilirubin: 0.5 mg/dL (ref 0.3–1.2)
Total Protein: 8.2 g/dL — ABNORMAL HIGH (ref 6.5–8.1)

## 2021-06-21 MED ORDER — SODIUM CHLORIDE 0.9 % IV BOLUS
500.0000 mL | Freq: Once | INTRAVENOUS | Status: AC
  Administered 2021-06-21: 500 mL via INTRAVENOUS

## 2021-06-21 MED ORDER — DIPHENHYDRAMINE HCL 50 MG/ML IJ SOLN
25.0000 mg | Freq: Once | INTRAMUSCULAR | Status: AC
  Administered 2021-06-21: 25 mg via INTRAVENOUS
  Filled 2021-06-21: qty 1

## 2021-06-21 MED ORDER — FENTANYL CITRATE PF 50 MCG/ML IJ SOSY
50.0000 ug | PREFILLED_SYRINGE | Freq: Once | INTRAMUSCULAR | Status: AC
  Administered 2021-06-21: 50 ug via INTRAVENOUS
  Filled 2021-06-21: qty 1

## 2021-06-21 NOTE — ED Provider Notes (Signed)
[facility]-EMERGENCY DEPT Provider Note   CSN: [id number] Arrival date & time: 06/21/21  1118     History Chief Complaint  Patient presents with   Fall   Headache    Stephanie [name] is a 55 y.o. female.  Stephanie [name] is a 55 y.o. female with hx of HTN, DM, COPD, stroke, IBS, and chronic pain, whp resents to ED for evaluation after a fall. Pt reports she had a fall at 11:30 last night. She  reports she slipped in her bathroom striking the right side of her forehead. She reports she did lose consciousness when she hit her head she is also complaining of pain in her neck and constant headache.  Patient is on Plavix, no other anticoagulation.  Also complaining of some anterior discomfort to her chest and pain in her right hip from the fall.  Patient reports she feels like she has a sharp stabbing pain in the front of her head and she reports that she feels drunk since falling but has not drank any alcohol or used any substances.    The history is provided by the patient and medical records.      Past Medical History:  Diagnosis Date   Diabetes mellitus without complication (HCC)    Difficult intubation    Diverticulitis    Headache    Hepatitis C    treated and resolved   History of kidney stones    Hypertension    IBS (irritable bowel syndrome)    Kidney stones    Sciatica    Sleep apnea    on cpap   Stroke (HCC) 2017   memory loss    Patient Active Problem List   Diagnosis Date Noted   Hypotension 08/22/2018   Bradycardia 08/22/2018   Type 2 diabetes mellitus (HCC) 08/22/2018   Acute encephalopathy 08/21/2018   Shoulder pain, right 12/22/2017   UTI (urinary tract infection) 08/21/2017   Nausea & vomiting 08/21/2017   Nicotine dependence 08/21/2017   Headache 01/07/2017   Small bowel obstruction (HCC) 09/02/2016   Pain and swelling of left upper extremity 07/22/2016   Superficial venous thrombosis of arm, left 07/22/2016   ICH  (intracerebral hemorrhage) (HCC)    Essential hypertension, malignant 04/19/2016   Cytotoxic brain edema (HCC) 04/19/2016   IVH (intraventricular hemorrhage) (HCC) 04/18/2016   Hepatitis C     Past Surgical History:  Procedure Laterality Date   ABDOMINAL HYSTERECTOMY     carpel tunnel syndrome  2017   HARDWARE REMOVAL Right 12/22/2017   Procedure: HARDWARE REMOVAL-RIGHT ARM;  Surgeon: Lyndle Herrlich, MD;  Location: [facility] ORS;  Service: Orthopedics;  Laterality: Right;  Removal of implant, superficial right humerus   HUMERUS IM NAIL Right 03/11/2017   Procedure: INTRAMEDULLARY (IM) NAIL HUMERAL;  Surgeon: Lyndle Herrlich, MD;  Location: [facility] ORS;  Service: Orthopedics;  Laterality: Right;   KIDNEY STONE SURGERY Right 02/12/12   KIDNEY STONE SURGERY Left 07/15/2012   LIGATION OF ARTERIOVENOUS  FISTULA Left 06/21/2016   Procedure: LIGATION OF ARTERIOVENOUS  FISTULA ( LIGATION BASILIC VEIN );  Surgeon: Renford Dills, MD;  Location: [facility] ORS;  Service: Vascular;  Laterality: Left;   LITHOTRIPSY     OOPHORECTOMY     SINUS EXPLORATION     TONSILLECTOMY       OB History   No obstetric history on file.     Family History  Problem Relation Age of Onset   Heart failure Mother  Diabetes Father    Cancer Sister     Social History   Tobacco Use   Smoking status: Every Day    Packs/day: 1.00    Years: 1.50    Pack years: 1.50    Types: Cigarettes   Smokeless tobacco: Never  Vaping Use   Vaping Use: Never used  Substance Use Topics   Alcohol use: Not Currently    Comment: no alcohol since 2002   Drug use: No    Home Medications Prior to Admission medications   Medication Sig Start Date End Date Taking? Authorizing Provider  ARIPiprazole (ABILIFY) 15 MG tablet Take 15 mg by mouth every morning. 06/10/21  Yes [provider]  budesonide-formoterol (SYMBICORT) 160-4.5 MCG/ACT inhaler Inhale 2 puffs into the lungs 2 (two) times daily.   Yes [provider]   CAPLYTA 42 MG capsule Take 42 mg by mouth every evening. 06/08/21  Yes [provider]  cetirizine (ZYRTEC) 10 MG tablet Take 10 mg by mouth at bedtime. 01/07/21  Yes [provider]  cholecalciferol (VITAMIN D3) 25 MCG (1000 UT) tablet Take 1,000 Units by mouth daily.   Yes [provider]  diazepam (VALIUM) 10 MG tablet Take 10 mg by mouth every 8 (eight) hours as needed for anxiety. 01/23/21  Yes [provider]  Fremanezumab-vfrm (AJOVY) 225 MG/1.5ML SOAJ Inject 225 mg into the skin every 30 (thirty) days. 10/17/20  Yes Butch Penny, NP  furosemide (LASIX) 40 MG tablet Take 40 mg by mouth daily as needed for edema. 01/23/21  Yes [provider]  glipiZIDE (GLUCOTROL) 5 MG tablet Take 1 tablet (5 mg total) by mouth 2 (two) times daily before a meal. 04/30/16  Yes Lora Paula, MD  KLOR-CON M10 10 MEQ tablet Take 10 mEq by mouth daily as needed. With furosemide 01/23/21  Yes [provider]  lamoTRIgine (LAMICTAL) 100 MG tablet Take 1 tablet (100 mg total) by mouth 2 (two) times daily. 06/20/20  Yes York Spaniel, MD  levETIRAcetam (KEPPRA) 750 MG tablet Take 1 tablet by mouth twice daily 01/08/21  Yes York Spaniel, MD  losartan (COZAAR) 100 MG tablet Take 100 mg by mouth daily. 01/23/21  Yes [provider]  metoprolol succinate (TOPROL-XL) 25 MG 24 hr tablet Take 1 tablet by mouth daily. 11/12/20  Yes [provider]  omeprazole (PRILOSEC) 40 MG capsule Take 40 mg by mouth daily as needed for heartburn. 08/13/19  Yes [provider]  phentermine (ADIPEX-P) 37.5 MG tablet Take 37.5 mg by mouth every morning. 01/23/21  Yes [provider]  prazosin (MINIPRESS) 1 MG capsule Take 3 mg by mouth every evening. 02/15/21  Yes [provider]  pregabalin (LYRICA) 150 MG capsule Take 150 mg by mouth at bedtime. 12/05/20  Yes [provider]  Rimegepant Sulfate (NURTEC) 75 MG TBDP Take 1 tablet daily  onset of migraine. 07/20/20  Yes Butch Penny, NP  rosuvastatin (CRESTOR) 20 MG tablet Take 20 mg by mouth daily. 12/06/20  Yes [provider]  traZODone (DESYREL) 150 MG tablet Take 150 mg by mouth at bedtime as needed for sleep.  05/30/20  Yes [provider]  TRINTELLIX 20 MG TABS tablet Take 20 mg by mouth daily. 01/22/21  Yes [provider]  clopidogrel (PLAVIX) 75 MG tablet Take 1 tablet (75 mg total) by mouth daily. Patient not taking: Reported on 06/21/2021 06/27/16   Marvel Plan, MD    Allergies    Gabapentin, Ibuprofen,  Sulfa antibiotics, Tylenol [acetaminophen], Aspirin, Buprenorphine hcl, Carbamazepine, Codeine, Compazine, Elemental sulfur, Ioxaglate, Ivp dye [iodinated diagnostic agents], Metrizamide, Naproxen, Norco [hydrocodone-acetaminophen], Other, Penicillin g, Prochlorperazine maleate, Reglan [metoclopramide], Toradol [ketorolac tromethamine], Tramadol, and Zofran [ondansetron hcl]  Review of Systems   Review of Systems  Constitutional:  Negative for chills and fever.  HENT: Negative.    Eyes:  Negative for visual disturbance.  Respiratory:  Negative for cough and shortness of breath.   Cardiovascular:  Positive for chest pain.  Gastrointestinal:  Negative for abdominal pain, nausea and vomiting.  Genitourinary:  Negative for dysuria.  Musculoskeletal:  Positive for arthralgias and back pain. Negative for neck pain.  Skin:  Negative for color change, rash and wound.  Neurological:  Positive for syncope and headaches. Negative for dizziness, weakness, light-headedness and numbness.  All other systems reviewed and are negative.  Physical Exam Updated Vital Signs BP 119/86   Pulse 78   Temp 97.8 F (36.6 C) (Oral)   Resp 16   SpO2 98%   Physical Exam Vitals and nursing note reviewed.  Constitutional:      General: She is not in acute distress.    Appearance: Normal appearance. She is well-developed. She is not diaphoretic.      Comments: Pt was initially more drowsy, but on re-evaluation is more awake and alert, able to answer all questions  HENT:     Head: Normocephalic.     Comments: Tenderness over the right forehead and supraorbital region without palpable hematoma, stepoff or deformity, no signs of trauma elsewhere, neg battle sign    Mouth/Throat:     Mouth: Mucous membranes are moist.     Pharynx: Oropharynx is clear.  Eyes:     General:        Right eye: No discharge.        Left eye: No discharge.     Extraocular Movements: Extraocular movements intact.     Pupils: Pupils are equal, round, and reactive to light.  Neck:     Comments: Tenderness over c-spine and right paraspinal muscles, ROM intact Cardiovascular:     Rate and Rhythm: Normal rate and regular rhythm.     Pulses: Normal pulses.     Heart sounds: Normal heart sounds.  Pulmonary:     Effort: Pulmonary effort is normal. No respiratory distress.     Breath sounds: Normal breath sounds. No wheezing or rales.     Comments: Respirations equal and unlabored, patient able to speak in full sentences, lungs clear to auscultation bilaterally, mild anterior chest wall tenderness, no deformity, ecchymosis or crepitus Chest:     Chest wall: Tenderness present.  Abdominal:     General: Bowel sounds are normal. There is no distension.     Palpations: Abdomen is soft. There is no mass.     Tenderness: There is no abdominal tenderness. There is no guarding.     Comments: Abdomen soft, nondistended, nontender to palpation in all quadrants without guarding or peritoneal signs  Musculoskeletal:        General: No deformity.     Cervical back: Neck supple.     Comments: Mild tenderness over the T-spine, no stepoff. No midline lumbar spine tenderness. Some mild tenderness over the right hip, but pt able to range it All other joints supple and easily moveable, all compartments soft  Skin:    General: Skin is warm and dry.     Capillary Refill: Capillary  refill takes less than 2 seconds.  Neurological:  Mental Status: She is alert and oriented to person, place, and time.     GCS: GCS eye subscore is 4. GCS verbal subscore is 5. GCS motor subscore is 6.     Coordination: Coordination normal.     Comments: Speech is clear, able to follow commands CN III-XII intact Normal strength in upper and lower extremities bilaterally including dorsiflexion and plantar flexion, strong and equal grip strength Sensation normal to light and sharp touch Moves extremities without ataxia, coordination intact  Psychiatric:        Mood and Affect: Mood normal.        Behavior: Behavior normal.    ED Results / Procedures / Treatments   Labs (all labs ordered are listed, but only abnormal results are displayed) Labs Reviewed  COMPREHENSIVE METABOLIC PANEL - Abnormal; Notable for the following components:      Result Value   Glucose, Bld 147 (*)    Total Protein 8.2 (*)    All other components within normal limits  CBC WITH DIFFERENTIAL/PLATELET - Abnormal; Notable for the following components:   Hemoglobin 15.2 (*)    HCT 48.2 (*)    All other components within normal limits    EKG EKG Interpretation  Date/Time:  Thursday June 21 2021 12:33:37 EDT Ventricular Rate:  85 PR Interval:  215 QRS Duration: 102 QT Interval:  376 QTC Calculation: 448 R Axis:   12 Text Interpretation: Sinus rhythm Prolonged PR interval Low voltage, precordial leads since last tracing no significant change Confirmed by Rolan Bucco [phone]) on 06/21/2021 2:11:51 PM  Radiology DG Chest 2 View  Result Date: 06/21/2021 CLINICAL DATA:  Fall EXAM: CHEST - 2 VIEW COMPARISON:  07/22/2020 FINDINGS: The heart size and mediastinal contours are within normal limits. Both lungs are clear. The visualized skeletal structures are unremarkable. IMPRESSION: No active cardiopulmonary disease. Electronically Signed   By: Marlan Palau M.D.   On: 06/21/2021 14:31   CT Head Wo  Contrast  Result Date: 06/21/2021 CLINICAL DATA:  Head trauma, coagulopathy (Age 68-64y) EXAM: CT HEAD WITHOUT CONTRAST TECHNIQUE: Contiguous axial images were obtained from the base of the skull through the vertex without intravenous contrast. COMPARISON:  March 14, 2021 FINDINGS: Brain: There is no acute intracranial hemorrhage, mass effect, or edema. No new loss of gray-white differentiation. Chronic infarct of the posterior left temporal lobe is again noted. There is no extra-axial fluid collection. Ventricles and sulci are within normal limits in size and configuration apart from ex vacuo dilatation the posterior left lateral ventricle. Vascular: There is atherosclerotic calcification at the skull base. Skull: Calvarium is unremarkable. Sinuses/Orbits: Evidence of prior sinonasal surgery. Mucosal thickening is present neck. Orbits are unremarkable. Other: Mastoid air cells are clear. IMPRESSION: No evidence of acute intracranial injury. Electronically Signed   By: Guadlupe Spanish M.D.   On: 06/21/2021 12:42   CT Cervical Spine Wo Contrast  Result Date: 06/21/2021 CLINICAL DATA:  Neck trauma, midline tenderness (Age 68-64y) EXAM: CT CERVICAL SPINE WITHOUT CONTRAST TECHNIQUE: Multidetector CT imaging of the cervical spine was performed without intravenous contrast. Multiplanar CT image reconstructions were also generated. COMPARISON:  July 2022 FINDINGS: Alignment: No significant listhesis. Skull base and vertebrae: Stable vertebral body heights with lower cervical degenerative endplate irregularity. No acute fracture. Soft tissues and spinal canal: No prevertebral fluid or swelling. No visible canal hematoma. Disc levels: Stable degenerative changes. No high-grade osseous encroachment on the spinal canal or neural foramina. Upper chest: Included lung apices are clear. Other: Calcified plaque  at the common carotid bifurcations. IMPRESSION: No acute cervical spine fracture. Electronically Signed   By: Guadlupe Spanish M.D.   On: 06/21/2021 12:46   DG Hip Unilat W or Wo Pelvis 2-3 Views Right  Result Date: 06/21/2021 CLINICAL DATA:  Fall. EXAM: DG HIP (WITH OR WITHOUT PELVIS) 2-3V RIGHT COMPARISON:  None. FINDINGS: There is no evidence of hip fracture or dislocation. There is no evidence of arthropathy or other focal bone abnormality. IMPRESSION: Negative. Electronically Signed   By: Marlan Palau M.D.   On: 06/21/2021 14:20    Procedures Procedures   Medications Ordered in ED Medications  sodium chloride 0.9 % bolus 500 mL (0 mLs Intravenous Stopped 06/21/21 1642)  fentaNYL (SUBLIMAZE) injection 50 mcg (50 mcg Intravenous Given 06/21/21 1439)  diphenhydrAMINE (BENADRYL) injection 25 mg (25 mg Intravenous Given 06/21/21 1439)    ED Course  I have reviewed the triage vital signs and the nursing notes.  Pertinent labs & imaging results that were available during my care of the patient were reviewed by me and considered in my medical decision making (see chart for details).    MDM Rules/Calculators/A&P                           55 year old female presents after a fall last night, striking her head.  She is on Plavix, no other anticoagulation, since the fall she has had a worsening headache and reports feeling like she is drunk, denies any substance use.  Also complaining of some neck pain, anterior chest tenderness and right hip pain.  No focal neurologic deficits noted on exam.  On initial assessment patient did seem somewhat drowsy, but after patient got into a room she was much more alert and able to answer all questions.  On chart review it appears patient has been seen for multiple similar falls complaining of headache and pain and has been to several different hospital systems with these complaints.  CT of the head and cervical spine as well as x-rays of the chest and right hip ordered as well as basic labs and EKG.  I have independently ordered, reviewed and interpreted all labs and  imaging: CBC: No leukocytosis, hemoglobin slightly elevated CMP: Glucose 147, but no other significant electrolyte derangements, normal renal and liver function  EKG was sinus rhythm with prolonged PR, no significant change when compared to previous tracing.  CT of the head and cervical spine with no evidence of acute traumatic injury fortunately x-rays of the chest and right hip are reassuring as well with no acute abnormality.  Pain treated here in the ED and now patient is able to ambulate in the department without assistance and reports feeling improved overall.  She seems to be at her baseline, no longer appears drowsy and at this time is stable for discharge home.  Discussed PCP follow-up and provided return precautions.  Discharged home in good condition.  Final Clinical Impression(s) / ED Diagnoses Final diagnoses:  Fall, initial encounter  Bad headache    Rx / DC Orders ED Discharge Orders     None        Legrand Rams 06/22/21 2120    Rolan Bucco, MD 06/25/21 1501

## 2021-06-21 NOTE — ED Notes (Addendum)
[phone] husband for updates

## 2021-06-21 NOTE — ED Provider Notes (Signed)
Emergency Medicine Provider Triage Evaluation Note  Stephanie [name] , a 55 y.o. female  was evaluated in triage.  Pt complains of fall and head injury.  Patient reports that around 11:38 this morning she was in her bathroom when she fell striking her head.  She reports she did not lose consciousness when she hit her head she is also complaining of pain in her neck.  Patient is on Plavix, no other anticoagulation.  Also complaining of some anterior discomfort to her chest and pain in her right hip from the fall.  Patient reports she feels like she has a sharp stabbing pain in the front of her head and she reports that she feels drunk since falling but has not drank any alcohol or used any substances.  Review of Systems  Positive: Headache, neck pain, chest pain, hip pain, confusion Negative: Vomiting, weakness  Physical Exam  BP 111/82 (BP Location: Left Arm)   Pulse 93   Temp 97.7 F (36.5 C) (Oral)   Resp 18   SpO2 94%  Gen:   Awake, but appears drowsy and confused Resp:  Normal effort , mild anterior chest wall tenderness without palpable deformity MSK:   Some tenderness and decreased range of motion of the right hip, tenderness over the cervical spine Other:  5/5 strength in bilateral upper and lower extremities sensation intact and radial nerves grossly intact but patient appears drowsy  Medical Decision Making  Medically screening exam initiated at 11:51 AM.  Appropriate orders placed.  Stephanie [name] was informed that the remainder of the evaluation will be completed by another provider, this initial triage assessment does not replace that evaluation, and the importance of remaining in the ED until their evaluation is complete.  Patient will be taken directly to CT, given patient having head injury now with severe headache and drowsiness, on Plavix, high clinical concern for potential intracranial bleed.   Dartha Lodge, PA-C 06/21/21 1155    Rolan Bucco, MD 06/21/21  [phone]

## 2021-06-21 NOTE — ED Notes (Signed)
Pt got OOB unassisted and ambulated around room w/ a steady gait.

## 2021-06-21 NOTE — Discharge Instructions (Signed)
Your lab work and imaging is reassuring, continue all of your home medications and follow-up closely with your primary care provider.

## 2021-06-21 NOTE — ED Triage Notes (Signed)
Pt arrived via POV, c/o mechanical fall last night, hit head, unsure what she hit on. On plavix. States she has a severe headache, feels "like an ice pick is in my head" and like she "feels drunk"

## 2021-07-04 ENCOUNTER — Emergency Department (HOSPITAL_COMMUNITY)
Admission: EM | Admit: 2021-07-04 | Discharge: 2021-07-04 | Disposition: A | Discharge status: Home / Self Care | Payer: Medicaid Other | Attending: Emergency Medicine | Admitting: Emergency Medicine

## 2021-07-04 ENCOUNTER — Emergency Department (HOSPITAL_COMMUNITY): Payer: Medicaid Other

## 2021-07-04 ENCOUNTER — Encounter (HOSPITAL_COMMUNITY): Payer: Self-pay

## 2021-07-04 DIAGNOSIS — E119 Type 2 diabetes mellitus without complications: Secondary | ICD-10-CM | POA: Insufficient documentation

## 2021-07-04 DIAGNOSIS — I10 Essential (primary) hypertension: Secondary | ICD-10-CM | POA: Diagnosis not present

## 2021-07-04 DIAGNOSIS — F17210 Nicotine dependence, cigarettes, uncomplicated: Secondary | ICD-10-CM | POA: Insufficient documentation

## 2021-07-04 DIAGNOSIS — Z7901 Long term (current) use of anticoagulants: Secondary | ICD-10-CM | POA: Insufficient documentation

## 2021-07-04 DIAGNOSIS — Z79899 Other long term (current) drug therapy: Secondary | ICD-10-CM | POA: Diagnosis not present

## 2021-07-04 DIAGNOSIS — M25552 Pain in left hip: Secondary | ICD-10-CM | POA: Diagnosis not present

## 2021-07-04 DIAGNOSIS — M25512 Pain in left shoulder: Secondary | ICD-10-CM | POA: Insufficient documentation

## 2021-07-04 DIAGNOSIS — Z7984 Long term (current) use of oral hypoglycemic drugs: Secondary | ICD-10-CM | POA: Diagnosis not present

## 2021-07-04 MED ORDER — MORPHINE SULFATE (PF) 2 MG/ML IV SOLN
2.0000 mg | Freq: Once | INTRAVENOUS | Status: AC
  Administered 2021-07-04: 2 mg via INTRAMUSCULAR
  Filled 2021-07-04: qty 1

## 2021-07-04 NOTE — ED Triage Notes (Signed)
Pt arrived via POV, c/o left shoulder and hip pain. S/p fall last week.

## 2021-07-04 NOTE — ED Provider Notes (Signed)
Emergency Medicine Provider Triage Evaluation Note  Stephanie [name] , a 55 y.o. female  was evaluated in triage.  Pt complains of L side pain, mostly of the L shoulder and arm.  However also reports pain of her left leg, left anterior and posterior torso.  She states she has had pain like this for a week.  Is not taking anything for it.  Review of Systems  Positive: L sided pain Negative: numbness  Physical Exam  BP 117/87 (BP Location: Right Arm)   Pulse 96   Temp (!) 97.5 F (36.4 C) (Oral)   Resp 17   SpO2 99%  Gen:   Awake, no distress   Resp:  Normal effort  MSK:   Diffuse tenderness palpation of the entire left side of the body anterior and posteriorly.  Medical Decision Making  Medically screening exam initiated at 5:32 PM.  Appropriate orders placed.  Stephanie [name] was informed that the remainder of the evaluation will be completed by another provider, this initial triage assessment does not replace that evaluation, and the importance of remaining in the ED until their evaluation is complete.  Shoulder xray   Stephanie Apley, PA-C 07/04/21 1733    Tegeler, Canary Brim, MD 07/04/21 2306

## 2021-07-04 NOTE — ED Notes (Signed)
Shoulder immobilizer applied before discharge. 

## 2021-07-04 NOTE — Discharge Instructions (Addendum)
Please follow-up with your orthopedist regarding your symptoms today.  You can continue to wear your sling as needed for comfort.  If you develop any new or worsening symptoms please come back to the emergency department.  It was a pleasure to meet you.

## 2021-07-04 NOTE — ED Provider Notes (Signed)
[facility]-EMERGENCY DEPT Provider Note   CSN: [id number] Arrival date & time: 07/04/21  1708     History Chief Complaint  Patient presents with   Hip Pain   Shoulder Pain    Stephanie [name] is a 55 y.o. female.  HPI Patient is a 55 year old female with a medical history as noted below.  She presents to the emergency department due to a fall that occurred 2 weeks ago.  She states she tripped over her dog and fell on her left side.  Reports persistent pain to the left shoulder and left hip since the fall.  No numbness or weakness.  No other complaints.  Patient is right-hand dominant.    Past Medical History:  Diagnosis Date   Diabetes mellitus without complication (HCC)    Difficult intubation    Diverticulitis    Headache    Hepatitis C    treated and resolved   History of kidney stones    Hypertension    IBS (irritable bowel syndrome)    Kidney stones    Sciatica    Sleep apnea    on cpap   Stroke (HCC) 2017   memory loss    Patient Active Problem List   Diagnosis Date Noted   Hypotension 08/22/2018   Bradycardia 08/22/2018   Type 2 diabetes mellitus (HCC) 08/22/2018   Acute encephalopathy 08/21/2018   Shoulder pain, right 12/22/2017   UTI (urinary tract infection) 08/21/2017   Nausea & vomiting 08/21/2017   Nicotine dependence 08/21/2017   Headache 01/07/2017   Small bowel obstruction (HCC) 09/02/2016   Pain and swelling of left upper extremity 07/22/2016   Superficial venous thrombosis of arm, left 07/22/2016   ICH (intracerebral hemorrhage) (HCC)    Essential hypertension, malignant 04/19/2016   Cytotoxic brain edema (HCC) 04/19/2016   IVH (intraventricular hemorrhage) (HCC) 04/18/2016   Hepatitis C     Past Surgical History:  Procedure Laterality Date   ABDOMINAL HYSTERECTOMY     carpel tunnel syndrome  2017   HARDWARE REMOVAL Right 12/22/2017   Procedure: HARDWARE REMOVAL-RIGHT ARM;  Surgeon: Lyndle Herrlich, MD;   Location: [facility] ORS;  Service: Orthopedics;  Laterality: Right;  Removal of implant, superficial right humerus   HUMERUS IM NAIL Right 03/11/2017   Procedure: INTRAMEDULLARY (IM) NAIL HUMERAL;  Surgeon: Lyndle Herrlich, MD;  Location: [facility] ORS;  Service: Orthopedics;  Laterality: Right;   KIDNEY STONE SURGERY Right 02/12/12   KIDNEY STONE SURGERY Left 07/15/2012   LIGATION OF ARTERIOVENOUS  FISTULA Left 06/21/2016   Procedure: LIGATION OF ARTERIOVENOUS  FISTULA ( LIGATION BASILIC VEIN );  Surgeon: Renford Dills, MD;  Location: [facility] ORS;  Service: Vascular;  Laterality: Left;   LITHOTRIPSY     OOPHORECTOMY     SINUS EXPLORATION     TONSILLECTOMY       OB History   No obstetric history on file.     Family History  Problem Relation Age of Onset   Heart failure Mother    Diabetes Father    Cancer Sister     Social History   Tobacco Use   Smoking status: Every Day    Packs/day: 1.00    Years: 1.50    Pack years: 1.50    Types: Cigarettes   Smokeless tobacco: Never  Vaping Use   Vaping Use: Never used  Substance Use Topics   Alcohol use: Not Currently    Comment: no alcohol since 2002   Drug use: No  Home Medications Prior to Admission medications   Medication Sig Start Date End Date Taking? Authorizing Provider  ARIPiprazole (ABILIFY) 15 MG tablet Take 15 mg by mouth every morning. 06/10/21   [provider]  budesonide-formoterol (SYMBICORT) 160-4.5 MCG/ACT inhaler Inhale 2 puffs into the lungs 2 (two) times daily.    [provider]  CAPLYTA 42 MG capsule Take 42 mg by mouth every evening. 06/08/21   [provider]  cetirizine (ZYRTEC) 10 MG tablet Take 10 mg by mouth at bedtime. 01/07/21   [provider]  cholecalciferol (VITAMIN D3) 25 MCG (1000 UT) tablet Take 1,000 Units by mouth daily.    [provider]  clopidogrel (PLAVIX) 75 MG tablet Take 1 tablet (75 mg total) by mouth daily. Patient not taking: Reported on  06/21/2021 06/27/16   Marvel Plan, MD  diazepam (VALIUM) 10 MG tablet Take 10 mg by mouth every 8 (eight) hours as needed for anxiety. 01/23/21   [provider]  Fremanezumab-vfrm (AJOVY) 225 MG/1.5ML SOAJ Inject 225 mg into the skin every 30 (thirty) days. 10/17/20   Butch Penny, NP  furosemide (LASIX) 40 MG tablet Take 40 mg by mouth daily as needed for edema. 01/23/21   [provider]  glipiZIDE (GLUCOTROL) 5 MG tablet Take 1 tablet (5 mg total) by mouth 2 (two) times daily before a meal. 04/30/16   Lora Paula, MD  KLOR-CON M10 10 MEQ tablet Take 10 mEq by mouth daily as needed. With furosemide 01/23/21   [provider]  lamoTRIgine (LAMICTAL) 100 MG tablet Take 1 tablet (100 mg total) by mouth 2 (two) times daily. 06/20/20   York Spaniel, MD  levETIRAcetam (KEPPRA) 750 MG tablet Take 1 tablet by mouth twice daily 01/08/21   York Spaniel, MD  losartan (COZAAR) 100 MG tablet Take 100 mg by mouth daily. 01/23/21   [provider]  metoprolol succinate (TOPROL-XL) 25 MG 24 hr tablet Take 1 tablet by mouth daily. 11/12/20   [provider]  omeprazole (PRILOSEC) 40 MG capsule Take 40 mg by mouth daily as needed for heartburn. 08/13/19   [provider]  phentermine (ADIPEX-P) 37.5 MG tablet Take 37.5 mg by mouth every morning. 01/23/21   [provider]  prazosin (MINIPRESS) 1 MG capsule Take 3 mg by mouth every evening. 02/15/21   [provider]  pregabalin (LYRICA) 150 MG capsule Take 150 mg by mouth at bedtime. 12/05/20   [provider]  Rimegepant Sulfate (NURTEC) 75 MG TBDP Take 1 tablet daily onset of migraine. 07/20/20   Butch Penny, NP  rosuvastatin (CRESTOR) 20 MG tablet Take 20 mg by mouth daily. 12/06/20   [provider]  traZODone (DESYREL) 150 MG tablet Take 150 mg by mouth at bedtime as needed for sleep.  05/30/20   [provider]  TRINTELLIX 20 MG TABS tablet Take 20 mg by  mouth daily. 01/22/21   [provider]    Allergies    Gabapentin, Ibuprofen, Sulfa antibiotics, Tylenol [acetaminophen], Aspirin, Buprenorphine hcl, Carbamazepine, Codeine, Compazine, Elemental sulfur, Ioxaglate, Ivp dye [iodinated diagnostic agents], Metrizamide, Naproxen, Norco [hydrocodone-acetaminophen], Other, Penicillin g, Prochlorperazine maleate, Reglan [metoclopramide], Toradol [ketorolac tromethamine], Tramadol, and Zofran [ondansetron hcl]  Review of Systems   Review of Systems  Musculoskeletal:  Positive for arthralgias and myalgias.  Skin:  Negative for color change and wound.  Neurological:  Negative for weakness and numbness.   Physical Exam Updated Vital Signs BP 117/87 (BP Location: Right Arm)  Pulse 96   Temp (!) 97.5 F (36.4 C) (Oral)   Resp 17   SpO2 99%   Physical Exam Vitals and nursing note reviewed.  Constitutional:      General: She is not in acute distress.    Appearance: She is well-developed.  HENT:     Head: Normocephalic and atraumatic.     Right Ear: External ear normal.     Left Ear: External ear normal.  Eyes:     General: No scleral icterus.       Right eye: No discharge.        Left eye: No discharge.     Conjunctiva/sclera: Conjunctivae normal.  Neck:     Trachea: No tracheal deviation.  Cardiovascular:     Rate and Rhythm: Normal rate.  Pulmonary:     Effort: Pulmonary effort is normal. No respiratory distress.     Breath sounds: No stridor.  Abdominal:     General: There is no distension.  Musculoskeletal:        General: Tenderness present. No swelling or deformity.     Cervical back: Neck supple.     Comments: Mild tenderness noted circumferentially about the left shoulder.  No overlying skin changes.  Unable to assess range of motion due to patient's pain.  2+ radial pulses.  Distal sensation intact.  Grip strength intact.  Mild tenderness noted along the left lateral hip.  Full range of motion of the left hip.   Patient able to stand and ambulate unassisted.  2+ pedal pulses.  2+ patellar DTRs noted bilaterally.  Skin:    General: Skin is warm and dry.     Findings: No rash.  Neurological:     Mental Status: She is alert.     Cranial Nerves: Cranial nerve deficit: no gross deficits.   ED Results / Procedures / Treatments   Labs (all labs ordered are listed, but only abnormal results are displayed) Labs Reviewed - No data to display  EKG None  Radiology DG Shoulder Left  Result Date: 07/04/2021 CLINICAL DATA:  Left shoulder pain.  No other history provided. EXAM: LEFT SHOULDER - 2+ VIEW COMPARISON:  None. FINDINGS: The glenohumeral and AC joints are intact.  No acute fracture. IMPRESSION: No acute bony findings. Electronically Signed   By: Rudie Meyer M.D.   On: 07/04/2021 18:50    Procedures Procedures   Medications Ordered in ED Medications  morphine 2 MG/ML injection 2 mg (has no administration in time range)    ED Course  I have reviewed the triage vital signs and the nursing notes.  Pertinent labs & imaging results that were available during my care of the patient were reviewed by me and considered in my medical decision making (see chart for details).    MDM Rules/Calculators/A&P                          Patient is a 55 year old female who presents to the emergency department due to a fall that occurred 2 weeks ago.  Patient has had persistent pain in the left shoulder and hip since falling.  X-rays were obtained in triage of the left shoulder which were negative.  Although the left hip does have mild tenderness along the lateral aspect, patient has full range of motion of the hip and is able to stand and ambulate unassisted.  Did not feel that further images were warranted and she was agreeable.  Patient given a  dose of morphine for her acute pain.  Will place in a shoulder sling for comfort.  She states that she has an orthopedist that she will follow-up with regarding her  symptoms today.  We discussed return precautions.  Her questions were answered and she was amicable at the time of discharge.  Final Clinical Impression(s) / ED Diagnoses Final diagnoses:  Acute pain of left shoulder    Rx / DC Orders ED Discharge Orders     None        Placido Sou, PA-C 07/04/21 2040    Jacalyn Lefevre, MD 07/04/21 2118

## 2021-07-14 ENCOUNTER — Emergency Department (HOSPITAL_COMMUNITY): Payer: Medicaid Other

## 2021-07-14 ENCOUNTER — Other Ambulatory Visit: Payer: Self-pay

## 2021-07-14 ENCOUNTER — Emergency Department (HOSPITAL_COMMUNITY)
Admission: EM | Admit: 2021-07-14 | Discharge: 2021-07-14 | Disposition: A | Discharge status: Home / Self Care | Payer: Medicaid Other | Attending: Emergency Medicine | Admitting: Emergency Medicine

## 2021-07-14 DIAGNOSIS — Z7984 Long term (current) use of oral hypoglycemic drugs: Secondary | ICD-10-CM | POA: Diagnosis not present

## 2021-07-14 DIAGNOSIS — Z79899 Other long term (current) drug therapy: Secondary | ICD-10-CM | POA: Diagnosis not present

## 2021-07-14 DIAGNOSIS — E119 Type 2 diabetes mellitus without complications: Secondary | ICD-10-CM | POA: Diagnosis not present

## 2021-07-14 DIAGNOSIS — S060X9A Concussion with loss of consciousness of unspecified duration, initial encounter: Secondary | ICD-10-CM | POA: Insufficient documentation

## 2021-07-14 DIAGNOSIS — S0990XA Unspecified injury of head, initial encounter: Secondary | ICD-10-CM | POA: Diagnosis present

## 2021-07-14 DIAGNOSIS — F17210 Nicotine dependence, cigarettes, uncomplicated: Secondary | ICD-10-CM | POA: Insufficient documentation

## 2021-07-14 DIAGNOSIS — S43402A Unspecified sprain of left shoulder joint, initial encounter: Secondary | ICD-10-CM | POA: Diagnosis not present

## 2021-07-14 DIAGNOSIS — I10 Essential (primary) hypertension: Secondary | ICD-10-CM | POA: Diagnosis not present

## 2021-07-14 DIAGNOSIS — S43409A Unspecified sprain of unspecified shoulder joint, initial encounter: Secondary | ICD-10-CM

## 2021-07-14 DIAGNOSIS — W182XXA Fall in (into) shower or empty bathtub, initial encounter: Secondary | ICD-10-CM | POA: Insufficient documentation

## 2021-07-14 LAB — BLOOD GAS, VENOUS
Acid-Base Excess: 6.5 mmol/L — ABNORMAL HIGH (ref 0.0–2.0)
Bicarbonate: 34.3 mmol/L — ABNORMAL HIGH (ref 20.0–28.0)
O2 Saturation: 44.3 %
Patient temperature: 98.6
pCO2, Ven: 66.1 mmHg — ABNORMAL HIGH (ref 44.0–60.0)
pH, Ven: 7.335 (ref 7.250–7.430)
pO2, Ven: <31 mmHg — CL (ref 32.0–45.0)

## 2021-07-14 LAB — COMPREHENSIVE METABOLIC PANEL
ALT: 19 U/L (ref 0–44)
AST: 20 U/L (ref 15–41)
Albumin: 4.4 g/dL (ref 3.5–5.0)
Alkaline Phosphatase: 119 U/L (ref 38–126)
Anion gap: 9 (ref 5–15)
BUN: 18 mg/dL (ref 6–20)
CO2: 32 mmol/L (ref 22–32)
Calcium: 9.9 mg/dL (ref 8.9–10.3)
Chloride: 99 mmol/L (ref 98–111)
Creatinine, Ser: 0.81 mg/dL (ref 0.44–1.00)
GFR, Estimated: >60 mL/min (ref >60)
Glucose, Bld: 125 mg/dL — ABNORMAL HIGH (ref 70–99)
Potassium: 3.5 mmol/L (ref 3.5–5.1)
Sodium: 140 mmol/L (ref 135–145)
Total Bilirubin: 0.4 mg/dL (ref 0.3–1.2)
Total Protein: 7.9 g/dL (ref 6.5–8.1)

## 2021-07-14 LAB — CBC WITH DIFFERENTIAL/PLATELET
Abs Immature Granulocytes: 0.02 10*3/uL (ref 0.00–0.07)
Basophils Absolute: 0 10*3/uL (ref 0.0–0.1)
Basophils Relative: 0 %
Eosinophils Absolute: 0.1 10*3/uL (ref 0.0–0.5)
Eosinophils Relative: 1 %
HCT: 42 % (ref 36.0–46.0)
Hemoglobin: 13.7 g/dL (ref 12.0–15.0)
Immature Granulocytes: 0 %
Lymphocytes Relative: 27 %
Lymphs Abs: 1.8 10*3/uL (ref 0.7–4.0)
MCH: 30.9 pg (ref 26.0–34.0)
MCHC: 32.6 g/dL (ref 30.0–36.0)
MCV: 94.8 fL (ref 80.0–100.0)
Monocytes Absolute: 0.7 10*3/uL (ref 0.1–1.0)
Monocytes Relative: 10 %
Neutro Abs: 4.1 10*3/uL (ref 1.7–7.7)
Neutrophils Relative %: 62 %
Platelets: 155 10*3/uL (ref 150–400)
RBC: 4.43 MIL/uL (ref 3.87–5.11)
RDW: 13.1 % (ref 11.5–15.5)
WBC: 6.7 10*3/uL (ref 4.0–10.5)
nRBC: 0 % (ref 0.0–0.2)

## 2021-07-14 LAB — ETHANOL: Alcohol, Ethyl (B): <10 mg/dL (ref <10)

## 2021-07-14 LAB — AMMONIA: Ammonia: 29 umol/L (ref 9–35)

## 2021-07-14 MED ORDER — FENTANYL CITRATE PF 50 MCG/ML IJ SOSY
50.0000 ug | PREFILLED_SYRINGE | Freq: Once | INTRAMUSCULAR | Status: AC
  Administered 2021-07-14: 50 ug via INTRAVENOUS
  Filled 2021-07-14: qty 1

## 2021-07-14 NOTE — ED Provider Notes (Signed)
Emergency Medicine Provider Triage Evaluation Note  Stephanie [name] , a 55 y.o. female  was evaluated in triage.  Pt complains of a fall that occurred this morning.  She got the shower and fell forward into the tub.  Unknown cause of her fall.  She states she did take her Valium this morning prior to falling.  She reports neck pain, head pain, left shoulder pain.  Unknown LOC.  Review of Systems  Positive:  Negative: See above   Physical Exam  BP 129/89   Pulse 100   Temp 97.8 F (36.6 C) (Oral)   Resp 16   Ht 5\' 2"  (1.575 m)   Wt 95.3 kg   SpO2 96%   BMI 38.41 kg/m  Gen:   Awake, no distress   Resp:  Normal effort  MSK:   Moves extremities without difficulty  Other:  Somnolent.  Medical Decision Making  Medically screening exam initiated at 11:53 AM.  Appropriate orders placed.  Stephanie [name] was informed that the remainder of the evaluation will be completed by another provider, this initial triage assessment does not replace that evaluation, and the importance of remaining in the ED until their evaluation is complete.  Will obtain basic labs and drug screen.  Patient is extremely somnolent in the triage room.   [name] Socorro, PA-C 07/14/21 1155    13/05/22, MD 07/15/21 [phone]

## 2021-07-14 NOTE — ED Triage Notes (Signed)
Patient reports fall around 130am , hitting head, left shoulder. Pain is 10/10. History of falls. Denies current anticoag usage

## 2021-07-14 NOTE — ED Notes (Signed)
IV removed by patient, bleeding controlled at this time.

## 2021-07-14 NOTE — ED Provider Notes (Signed)
[facility]-EMERGENCY DEPT Provider Note   CSN: [id number] Arrival date & time: 07/14/21  1036     History Chief Complaint  Patient presents with   Fall    Stephanie [name] is a 55 y.o. female.   Fall   Patient presents to the ED for evaluation after fall this a.m. around 10 AM.  Patient states she was getting out of the tub.  sHe started to feel lightheaded and then falling forward striking her head and face.  Patient did take her Valium dose prior to the fall this morning.  She is now complaining of pain in her head and face.  It hurts when she moves her eyes.  She is also having pain in her left shoulder.  She denies any nausea.  No fevers or chills.  No chest pain or shortness of breath.  Past Medical History:  Diagnosis Date   Diabetes mellitus without complication (HCC)    Difficult intubation    Diverticulitis    Headache    Hepatitis C    treated and resolved   History of kidney stones    Hypertension    IBS (irritable bowel syndrome)    Kidney stones    Sciatica    Sleep apnea    on cpap   Stroke (HCC) 2017   memory loss    Patient Active Problem List   Diagnosis Date Noted   Hypotension 08/22/2018   Bradycardia 08/22/2018   Type 2 diabetes mellitus (HCC) 08/22/2018   Acute encephalopathy 08/21/2018   Shoulder pain, right 12/22/2017   UTI (urinary tract infection) 08/21/2017   Nausea & vomiting 08/21/2017   Nicotine dependence 08/21/2017   Headache 01/07/2017   Small bowel obstruction (HCC) 09/02/2016   Pain and swelling of left upper extremity 07/22/2016   Superficial venous thrombosis of arm, left 07/22/2016   ICH (intracerebral hemorrhage) (HCC)    Essential hypertension, malignant 04/19/2016   Cytotoxic brain edema (HCC) 04/19/2016   IVH (intraventricular hemorrhage) (HCC) 04/18/2016   Hepatitis C     Past Surgical History:  Procedure Laterality Date   ABDOMINAL HYSTERECTOMY     carpel tunnel syndrome  2017   HARDWARE  REMOVAL Right 12/22/2017   Procedure: HARDWARE REMOVAL-RIGHT ARM;  Surgeon: Lyndle Herrlich, MD;  Location: [facility] ORS;  Service: Orthopedics;  Laterality: Right;  Removal of implant, superficial right humerus   HUMERUS IM NAIL Right 03/11/2017   Procedure: INTRAMEDULLARY (IM) NAIL HUMERAL;  Surgeon: Lyndle Herrlich, MD;  Location: [facility] OR[facility];  Service: Orthopedics;  Laterality: Right;   KIDNEY STONE SURGERY Right 02/12/12   KIDNEY STONE SURGERY Left 07/15/2012   LIGATION OF ARTERIOVENOUS  FISTULA Left 06/21/2016   Procedure: LIGATION OF ARTERIOVENOUS  FISTULA ( LIGATION BASILIC VEIN );  Surgeon: Renford Dills, MD;  Location: [facility] ORS;  Service: Vascular;  Laterality: Left;   LITHOTRIPSY     OOPHORECTOMY     SINUS EXPLORATION     TONSILLECTOMY       OB History   No obstetric history on file.     Family History  Problem Relation Age of Onset   Heart failure Mother    Diabetes Father    Cancer Sister     Social History   Tobacco Use   Smoking status: Every Day    Packs/day: 1.00    Years: 1.50    Pack years: 1.50    Types: Cigarettes   Smokeless tobacco: Never  Vaping Use   Vaping  Use: Never used  Substance Use Topics   Alcohol use: Not Currently    Comment: no alcohol since 2002   Drug use: No    Home Medications Prior to Admission medications   Medication Sig Start Date End Date Taking? Authorizing Provider  ARIPiprazole (ABILIFY) 15 MG tablet Take 15 mg by mouth every morning. 06/10/21  Yes [provider]  budesonide-formoterol (SYMBICORT) 160-4.5 MCG/ACT inhaler Inhale 2 puffs into the lungs 2 (two) times daily.   Yes [provider]  CAPLYTA 42 MG capsule Take 42 mg by mouth every evening. 06/08/21  Yes [provider]  cetirizine (ZYRTEC) 10 MG tablet Take 10 mg by mouth at bedtime as needed for allergies. 01/07/21  Yes [provider]  cholecalciferol (VITAMIN D3) 25 MCG (1000 UT) tablet Take 1,000 Units by mouth daily.   Yes  [provider]  Cyanocobalamin (VITAMIN B-12 PO) Take 1 tablet by mouth daily.   Yes [provider]  diazepam (VALIUM) 10 MG tablet Take 10 mg by mouth every 8 (eight) hours as needed for anxiety. 01/23/21  Yes [provider]  Fremanezumab-vfrm (AJOVY) 225 MG/1.5ML SOAJ Inject 225 mg into the skin every 30 (thirty) days. 10/17/20  Yes Butch Penny, NP  furosemide (LASIX) 40 MG tablet Take 40 mg by mouth daily as needed for edema. 01/23/21  Yes [provider]  glipiZIDE (GLUCOTROL) 5 MG tablet Take 1 tablet (5 mg total) by mouth 2 (two) times daily before a meal. 04/30/16  Yes Lora Paula, MD  KLOR-CON M10 10 MEQ tablet Take 10 mEq by mouth daily as needed (when taking furosemide). 01/23/21  Yes [provider]  lamoTRIgine (LAMICTAL) 100 MG tablet Take 1 tablet (100 mg total) by mouth 2 (two) times daily. 06/20/20  Yes York Spaniel, MD  levETIRAcetam (KEPPRA) 750 MG tablet Take 1 tablet by mouth twice daily Patient taking differently: Take 750 mg by mouth 2 (two) times daily. 01/08/21  Yes York Spaniel, MD  losartan (COZAAR) 100 MG tablet Take 100 mg by mouth daily. 01/23/21  Yes [provider]  metoprolol succinate (TOPROL-XL) 25 MG 24 hr tablet Take 25 mg by mouth daily. 11/12/20  Yes [provider]  omeprazole (PRILOSEC) 40 MG capsule Take 40 mg by mouth daily. 08/13/19  Yes [provider]  phentermine (ADIPEX-P) 37.5 MG tablet Take 37.5 mg by mouth every morning. 01/23/21  Yes [provider]  prazosin (MINIPRESS) 1 MG capsule Take 3 mg by mouth every evening. 02/15/21  Yes [provider]  pregabalin (LYRICA) 150 MG capsule Take 150 mg by mouth at bedtime. 12/05/20  Yes [provider]  Rimegepant Sulfate (NURTEC) 75 MG TBDP Take 1 tablet daily onset of migraine. Patient taking differently: Take 75 mg by mouth daily as needed (onset of migraine). 07/20/20  Yes Butch Penny, NP   traZODone (DESYREL) 150 MG tablet Take 150 mg by mouth at bedtime as needed for sleep.  05/30/20  Yes [provider]  TRINTELLIX 20 MG TABS tablet Take 20 mg by mouth daily. 01/22/21  Yes [provider]    Allergies    Gabapentin, Ibuprofen, Sulfa antibiotics, Tylenol [acetaminophen], Aspirin, Buprenorphine hcl, Carbamazepine, Codeine, Compazine, Elemental sulfur, Ioxaglate, Ivp dye [iodinated diagnostic agents], Metrizamide, Naproxen, Norco [hydrocodone-acetaminophen], Other, Penicillin g, Prochlorperazine maleate, Reglan [metoclopramide], Toradol [ketorolac tromethamine], Tramadol, and Zofran [ondansetron hcl]  Review of Systems   Review of Systems  All other systems reviewed and are negative.  Physical  Exam Updated Vital Signs BP 111/78   Pulse 77   Temp 97.8 F (36.6 C) (Oral)   Resp 16   Ht 1.575 m (5\' 2" )   Wt 95.3 kg   SpO2 97%   BMI 38.41 kg/m   Physical Exam Vitals and nursing note reviewed.  Constitutional:      General: She is not in acute distress.    Appearance: She is well-developed.  HENT:     Head: Normocephalic and atraumatic.     Right Ear: External ear normal.     Left Ear: External ear normal.  Eyes:     General: No scleral icterus.       Right eye: No discharge.        Left eye: No discharge.     Conjunctiva/sclera: Conjunctivae normal.  Neck:     Trachea: No tracheal deviation.  Cardiovascular:     Rate and Rhythm: Normal rate and regular rhythm.  Pulmonary:     Effort: Pulmonary effort is normal. No respiratory distress.     Breath sounds: Normal breath sounds. No stridor. No wheezing or rales.  Chest:     Chest wall: Tenderness present. No deformity.  Abdominal:     General: Bowel sounds are normal. There is no distension.     Palpations: Abdomen is soft.     Tenderness: There is no abdominal tenderness. There is no guarding or rebound.  Musculoskeletal:        General: No deformity.     Left shoulder: Tenderness  present.     Cervical back: Neck supple.     Comments: No tenderness palpation bilateral lower extremities, right upper extremity without tenderness or deformity, left upper extremity no tenderness in the elbow or wrist but patient does have tenderness in the left shoulder  Skin:    General: Skin is warm and dry.     Findings: No rash.  Neurological:     General: No focal deficit present.     Mental Status: She is alert.     Cranial Nerves: No cranial nerve deficit (no facial droop, extraocular movements intact, no slurred speech).     Sensory: No sensory deficit.     Motor: No abnormal muscle tone or seizure activity.     Coordination: Coordination normal.  Psychiatric:        Mood and Affect: Mood normal.    ED Results / Procedures / Treatments   Labs (all labs ordered are listed, but only abnormal results are displayed) Labs Reviewed  COMPREHENSIVE METABOLIC PANEL - Abnormal; Notable for the following components:      Result Value   Glucose, Bld 125 (*)    All other components within normal limits  BLOOD GAS, VENOUS - Abnormal; Notable for the following components:   pCO2, Ven 66.1 (*)    pO2, Ven <31.0 (*)    Bicarbonate 34.3 (*)    Acid-Base Excess 6.5 (*)    All other components within normal limits  CBC WITH DIFFERENTIAL/PLATELET  ETHANOL  AMMONIA  RAPID URINE DRUG SCREEN, HOSP PERFORMED    EKG None  Radiology CT Head Wo Contrast  Result Date: 07/14/2021 CLINICAL DATA:  Head trauma.  Fall EXAM: CT HEAD WITHOUT CONTRAST CT CERVICAL SPINE WITHOUT CONTRAST TECHNIQUE: Multidetector CT imaging of the head and cervical spine was performed following the standard protocol without intravenous contrast. Multiplanar CT image reconstructions of the cervical spine were also generated. COMPARISON:  CT head cervical spine 06/21/2021 FINDINGS: CT HEAD FINDINGS Brain:  Chronic infarct left temporoparietal lobe unchanged. Negative for acute infarct, hemorrhage, mass, hydrocephalus.  Empty sella. Vascular: Negative for hyperdense vessel Skull: Negative Sinuses/Orbits: Mucosal edema paranasal sinuses. Prior sinus surgery. Negative orbit Other: None CT CERVICAL SPINE FINDINGS Alignment: Normal Skull base and vertebrae: Negative for fracture Soft tissues and spinal canal: Atherosclerotic calcification of the carotid artery bilaterally. No soft tissue mass. Disc levels: Disc and facet degeneration in the cervical spine without significant spinal stenosis. Upper chest: Lung apices clear bilaterally. Other: None IMPRESSION: 1. No acute intracranial abnormality. Chronic infarct left temporoparietal lobe 2. Negative for cervical spine fracture. Electronically Signed   By: Marlan Palau M.D.   On: 07/14/2021 13:16   CT Cervical Spine Wo Contrast  Result Date: 07/14/2021 CLINICAL DATA:  Head trauma.  Fall EXAM: CT HEAD WITHOUT CONTRAST CT CERVICAL SPINE WITHOUT CONTRAST TECHNIQUE: Multidetector CT imaging of the head and cervical spine was performed following the standard protocol without intravenous contrast. Multiplanar CT image reconstructions of the cervical spine were also generated. COMPARISON:  CT head cervical spine 06/21/2021 FINDINGS: CT HEAD FINDINGS Brain: Chronic infarct left temporoparietal lobe unchanged. Negative for acute infarct, hemorrhage, mass, hydrocephalus. Empty sella. Vascular: Negative for hyperdense vessel Skull: Negative Sinuses/Orbits: Mucosal edema paranasal sinuses. Prior sinus surgery. Negative orbit Other: None CT CERVICAL SPINE FINDINGS Alignment: Normal Skull base and vertebrae: Negative for fracture Soft tissues and spinal canal: Atherosclerotic calcification of the carotid artery bilaterally. No soft tissue mass. Disc levels: Disc and facet degeneration in the cervical spine without significant spinal stenosis. Upper chest: Lung apices clear bilaterally. Other: None IMPRESSION: 1. No acute intracranial abnormality. Chronic infarct left temporoparietal lobe 2.  Negative for cervical spine fracture. Electronically Signed   By: Marlan Palau M.D.   On: 07/14/2021 13:16   DG Chest Portable 1 View  Result Date: 07/14/2021 CLINICAL DATA:  Shortness of breath.  Fall earlier today. EXAM: PORTABLE CHEST 1 VIEW COMPARISON:  Chest radiograph 06/21/2021 FINDINGS: The heart size and mediastinal contours are within normal limits. Aortic calcifications. Subsegmental left lower lobe atelectasis. The right lung is clear. No pleural effusion or pneumothorax. No acute osseous abnormality. IMPRESSION: No acute cardiopulmonary abnormality. Aortic Atherosclerosis (ICD10-I70.0). Electronically Signed   By: Sherron Ales M.D.   On: 07/14/2021 14:57   DG Shoulder Left  Result Date: 07/14/2021 CLINICAL DATA:  55 year old female with a history of left anterior shoulder pain after a fall EXAM: LEFT SHOULDER - 2+ VIEW COMPARISON:  07/04/2021 FINDINGS: No acute displaced fracture. Glenohumeral joint appears congruent. Degenerative changes of the acromioclavicular joint. Unremarkable appearance of the proximal humerus. IMPRESSION: Negative for acute bony abnormality. Degenerative changes of the [facility] joint Electronically Signed   By: Gilmer Mor D.O.   On: 07/14/2021 13:27    Procedures Procedures   Medications Ordered in ED Medications  fentaNYL (SUBLIMAZE) injection 50 mcg (50 mcg Intravenous Given 07/14/21 1409)    ED Course  I have reviewed the triage vital signs and the nursing notes.  Pertinent labs & imaging results that were available during my care of the patient were reviewed by me and considered in my medical decision making (see chart for details).  Clinical Course as of 07/14/21 1546  Sat Jul 14, 2021  1402 Labs show normal venous pH.  Metabolic panel unremarkable.  CBC normal.  Ammonia and alcohol level are negative. [JK]  1403 Head CT, C-spine CT are negative.  Shoulder x-ray is negative [JK]    Clinical Course User Index [JK] Linwood Dibbles, MD  MDM  Rules/Calculators/A&P                           Patient presented to the emergency room for evaluation after a fall.  Patient is not sure why she fell but she did take Valium this morning.  Patient did seem somewhat somnolent initially.  ED work-up otherwise reassuring.  No focal neurodeficits.  Venous blood gas does show elevated PCO2 but patient has a normal pH.  Suspect chronic component.  No signs of anemia.  No electrolyte abnormalities.  Ammonia levels normal.  X-rays and CT scans without serious injury.  Suspect mechanical fall.  [place] medication may have contributed to her fall.   Final Clinical Impression(s) / ED Diagnoses Final diagnoses:  Concussion with loss of consciousness, initial encounter  Sprain of shoulder, unspecified laterality, unspecified shoulder sprain type, initial encounter    Rx / DC Orders ED Discharge Orders     None        Linwood Dibbles, MD 07/14/21 1547

## 2021-07-14 NOTE — Discharge Instructions (Addendum)
Take over-the-counter medications as needed for pain.  Follow-up with your doctor to be rechecked if the symptoms do not resolve over the next week  X-rays obtained today are without evidence of significant fracture or other acute abnormality.

## 2021-07-16 ENCOUNTER — Emergency Department: Payer: Medicaid Other

## 2021-07-16 ENCOUNTER — Emergency Department
Admission: EM | Admit: 2021-07-16 | Discharge: 2021-07-16 | Disposition: A | Discharge status: Home / Self Care | Payer: Medicaid Other | Attending: Emergency Medicine | Admitting: Emergency Medicine

## 2021-07-16 ENCOUNTER — Other Ambulatory Visit: Payer: Self-pay

## 2021-07-16 ENCOUNTER — Encounter: Payer: Self-pay | Admitting: Emergency Medicine

## 2021-07-16 DIAGNOSIS — N3000 Acute cystitis without hematuria: Secondary | ICD-10-CM | POA: Diagnosis not present

## 2021-07-16 DIAGNOSIS — W010XXA Fall on same level from slipping, tripping and stumbling without subsequent striking against object, initial encounter: Secondary | ICD-10-CM | POA: Diagnosis not present

## 2021-07-16 DIAGNOSIS — E119 Type 2 diabetes mellitus without complications: Secondary | ICD-10-CM | POA: Insufficient documentation

## 2021-07-16 DIAGNOSIS — F17210 Nicotine dependence, cigarettes, uncomplicated: Secondary | ICD-10-CM | POA: Diagnosis not present

## 2021-07-16 DIAGNOSIS — S0083XA Contusion of other part of head, initial encounter: Secondary | ICD-10-CM | POA: Diagnosis not present

## 2021-07-16 DIAGNOSIS — Z79899 Other long term (current) drug therapy: Secondary | ICD-10-CM | POA: Insufficient documentation

## 2021-07-16 DIAGNOSIS — I10 Essential (primary) hypertension: Secondary | ICD-10-CM | POA: Insufficient documentation

## 2021-07-16 DIAGNOSIS — R296 Repeated falls: Secondary | ICD-10-CM

## 2021-07-16 DIAGNOSIS — S0993XA Unspecified injury of face, initial encounter: Secondary | ICD-10-CM | POA: Diagnosis present

## 2021-07-16 LAB — COMPREHENSIVE METABOLIC PANEL
ALT: 23 U/L (ref 0–44)
AST: 25 U/L (ref 15–41)
Albumin: 4.2 g/dL (ref 3.5–5.0)
Alkaline Phosphatase: 107 U/L (ref 38–126)
Anion gap: 11 (ref 5–15)
BUN: 15 mg/dL (ref 6–20)
CO2: 29 mmol/L (ref 22–32)
Calcium: 9.6 mg/dL (ref 8.9–10.3)
Chloride: 102 mmol/L (ref 98–111)
Creatinine, Ser: 0.86 mg/dL (ref 0.44–1.00)
GFR, Estimated: >60 mL/min (ref >60)
Glucose, Bld: 99 mg/dL (ref 70–99)
Potassium: 3.7 mmol/L (ref 3.5–5.1)
Sodium: 142 mmol/L (ref 135–145)
Total Bilirubin: 0.8 mg/dL (ref 0.3–1.2)
Total Protein: 7.6 g/dL (ref 6.5–8.1)

## 2021-07-16 LAB — CBC WITH DIFFERENTIAL/PLATELET
Abs Immature Granulocytes: 0.01 10*3/uL (ref 0.00–0.07)
Basophils Absolute: 0 10*3/uL (ref 0.0–0.1)
Basophils Relative: 1 %
Eosinophils Absolute: 0.1 10*3/uL (ref 0.0–0.5)
Eosinophils Relative: 1 %
HCT: 42.2 % (ref 36.0–46.0)
Hemoglobin: 13.8 g/dL (ref 12.0–15.0)
Immature Granulocytes: 0 %
Lymphocytes Relative: 33 %
Lymphs Abs: 2.3 10*3/uL (ref 0.7–4.0)
MCH: 31.2 pg (ref 26.0–34.0)
MCHC: 32.7 g/dL (ref 30.0–36.0)
MCV: 95.3 fL (ref 80.0–100.0)
Monocytes Absolute: 0.7 10*3/uL (ref 0.1–1.0)
Monocytes Relative: 10 %
Neutro Abs: 3.7 10*3/uL (ref 1.7–7.7)
Neutrophils Relative %: 55 %
Platelets: 162 10*3/uL (ref 150–400)
RBC: 4.43 MIL/uL (ref 3.87–5.11)
RDW: 13 % (ref 11.5–15.5)
WBC: 6.8 10*3/uL (ref 4.0–10.5)
nRBC: 0 % (ref 0.0–0.2)

## 2021-07-16 LAB — URINALYSIS, COMPLETE (UACMP) WITH MICROSCOPIC
Bilirubin Urine: NEGATIVE
Glucose, UA: NEGATIVE mg/dL
Hgb urine dipstick: NEGATIVE
Ketones, ur: NEGATIVE mg/dL
Nitrite: NEGATIVE
Protein, ur: 30 mg/dL — AB
Specific Gravity, Urine: 1.02 (ref 1.005–1.030)
WBC, UA: >50 WBC/hpf — ABNORMAL HIGH (ref 0–5)
pH: 5 (ref 5.0–8.0)

## 2021-07-16 LAB — TROPONIN I (HIGH SENSITIVITY): Troponin I (High Sensitivity): <2 ng/L (ref <18)

## 2021-07-16 LAB — TSH: TSH: 1.443 u[IU]/mL (ref 0.350–4.500)

## 2021-07-16 MED ORDER — SODIUM CHLORIDE 0.9 % IV SOLN
1.0000 g | Freq: Once | INTRAVENOUS | Status: AC
  Administered 2021-07-16: 1 g via INTRAVENOUS
  Filled 2021-07-16: qty 10

## 2021-07-16 MED ORDER — FLUCONAZOLE 100 MG PO TABS: 150.0000 mg | ORAL_TABLET | Freq: Once | ORAL | 0 refills | Status: AC

## 2021-07-16 MED ORDER — NITROFURANTOIN MONOHYD MACRO 100 MG PO CAPS: 100.0000 mg | ORAL_CAPSULE | Freq: Two times a day (BID) | ORAL | 0 refills | Status: AC

## 2021-07-16 NOTE — ED Notes (Signed)
Patient transported to MRI 

## 2021-07-16 NOTE — ED Triage Notes (Signed)
Pt does not recall if she passed out or not. Denies any blood thinner

## 2021-07-16 NOTE — ED Provider Notes (Signed)
[facility] Emergency Department Provider Note  ____________________________________________   Event Date/Time   First MD Initiated Contact with Patient 07/16/21 1432     (approximate)  I have reviewed the triage vital signs and the nursing notes.   HISTORY  Chief Complaint Fall   HPI Stephanie [name] is a 55 y.o. 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  Patient states she tripped over her dog this morning and fell onto her face.  She states she landed on her nose and is not sure if she passed out or not.  She has some headache face pain.  She did not have any acute pain after this fall in her arms or legs chest or back.  She notes this is the fourth fall she has had in the past week.  She was seen on 10/5 after a fall and states she had 2 since then.  He states that the first 1 since being evaluated on the fifth was because she was in the bathroom and did not turn the light on and tripped on something.  States that the second time she fell she was also in the bathroom and then lost her balance.  She denies any fevers, vision changes, vertigo, chest pain, cough, vomiting, diarrhea, burning with urination, rash or other clear associated sick symptoms.  No medication changes.  She has been taking her Valium she has taken for a long time and does not think this is related.  She denies any EtOH use or illicit drug use.  She is not on any blood thinners.  No other acute concerns at this time.         Past Medical History:  Diagnosis Date   Diabetes mellitus without complication ([place])    Difficult intubation    Diverticulitis    Headache    Hepatitis C    treated and resolved   History of  kidney stones    Hypertension    IBS (irritable bowel syndrome)    Kidney stones    Sciatica    Sleep apnea    on cpap   Stroke ([place]) 2017   memory loss    Patient Active Problem List   Diagnosis Date Noted   Hypotension 08/22/2018   Bradycardia 08/22/2018   Type 2 diabetes mellitus ([place]) 08/22/2018   Acute encephalopathy 08/21/2018   Shoulder pain, right 12/22/2017   UTI (urinary tract infection) 08/21/2017   Nausea & vomiting 08/21/2017   Nicotine dependence 08/21/2017   Headache 01/07/2017   Small bowel obstruction ([place]) 09/02/2016   Pain and swelling of left upper extremity 07/22/2016   Superficial venous thrombosis of arm, left 07/22/2016   ICH (intracerebral hemorrhage) ([place])    Essential hypertension, malignant 04/19/2016   Cytotoxic brain edema ([place]) 04/19/2016   IVH (intraventricular hemorrhage) ([place]) 04/18/2016   Hepatitis C     Past Surgical History:  Procedure Laterality Date   ABDOMINAL HYSTERECTOMY     carpel tunnel syndrome  2017   HARDWARE REMOVAL Right 12/22/2017   Procedure: HARDWARE REMOVAL-RIGHT ARM;  Surgeon: Lovell Sheehan, MD;  Location: [facility] ORS;  Service: Orthopedics;  Laterality: Right;  Removal of implant, superficial right humerus   HUMERUS IM NAIL Right 03/11/2017  Procedure: INTRAMEDULLARY (IM) NAIL HUMERAL;  Surgeon: Lovell Sheehan, MD;  Location: [facility] ORS;  Service: Orthopedics;  Laterality: Right;   KIDNEY STONE SURGERY Right 02/12/12   KIDNEY STONE SURGERY Left 07/15/2012   LIGATION OF ARTERIOVENOUS  FISTULA Left 06/21/2016   Procedure: LIGATION OF ARTERIOVENOUS  FISTULA ( LIGATION BASILIC VEIN );  Surgeon: Katha Cabal, MD;  Location: [facility] ORS;  Service: Vascular;  Laterality: Left;   LITHOTRIPSY     OOPHORECTOMY     SINUS EXPLORATION     TONSILLECTOMY      Prior to Admission medications   Medication Sig Start Date End Date Taking? Authorizing Provider  nitrofurantoin, macrocrystal-monohydrate, (MACROBID) 100 MG capsule Take 1  capsule (100 mg total) by mouth 2 (two) times daily for 7 days. 07/16/21 07/23/21 Yes Lucrezia Starch, MD  ARIPiprazole (ABILIFY) 15 MG tablet Take 15 mg by mouth every morning. 06/10/21   [provider]  budesonide-formoterol (SYMBICORT) 160-4.5 MCG/ACT inhaler Inhale 2 puffs into the lungs 2 (two) times daily.    [provider]  CAPLYTA 42 MG capsule Take 42 mg by mouth every evening. 06/08/21   [provider]  cetirizine (ZYRTEC) 10 MG tablet Take 10 mg by mouth at bedtime as needed for allergies. 01/07/21   [provider]  cholecalciferol (VITAMIN D3) 25 MCG (1000 UT) tablet Take 1,000 Units by mouth daily.    [provider]  Cyanocobalamin (VITAMIN B-12 PO) Take 1 tablet by mouth daily.    [provider]  diazepam (VALIUM) 10 MG tablet Take 10 mg by mouth every 8 (eight) hours as needed for anxiety. 01/23/21   [provider]  Fremanezumab-vfrm (AJOVY) 225 MG/1.5ML SOAJ Inject 225 mg into the skin every 30 (thirty) days. 10/17/20   Ward Givens, NP  furosemide (LASIX) 40 MG tablet Take 40 mg by mouth daily as needed for edema. 01/23/21   [provider]  glipiZIDE (GLUCOTROL) 5 MG tablet Take 1 tablet (5 mg total) by mouth 2 (two) times daily before a meal. 04/30/16   Milagros Loll, MD  KLOR-CON M10 10 MEQ tablet Take 10 mEq by mouth daily as needed (when taking furosemide). 01/23/21   [provider]  lamoTRIgine (LAMICTAL) 100 MG tablet Take 1 tablet (100 mg total) by mouth 2 (two) times daily. 06/20/20   Kathrynn Ducking, MD  levETIRAcetam (KEPPRA) 750 MG tablet Take 1 tablet by mouth twice daily Patient taking differently: Take 750 mg by mouth 2 (two) times daily. 01/08/21   Kathrynn Ducking, MD  losartan (COZAAR) 100 MG tablet Take 100 mg by mouth daily. 01/23/21   [provider]  metoprolol succinate (TOPROL-XL) 25 MG 24 hr tablet Take 25 mg by mouth daily. 11/12/20   [provider]   omeprazole (PRILOSEC) 40 MG capsule Take 40 mg by mouth daily. 08/13/19   [provider]  phentermine (ADIPEX-P) 37.5 MG tablet Take 37.5 mg by mouth every morning. 01/23/21   [provider]  prazosin (MINIPRESS) 1 MG capsule Take 3 mg by mouth every evening. 02/15/21   [provider]  pregabalin (LYRICA) 150 MG capsule Take 150 mg by mouth at bedtime. 12/05/20   [provider]  Rimegepant Sulfate (NURTEC) 75 MG TBDP Take 1 tablet daily onset of migraine. Patient taking differently: Take 75 mg by mouth daily as needed (onset of migraine). 07/20/20   Ward Givens, NP  traZODone (DESYREL) 150 MG tablet Take 150 mg by mouth at  bedtime as needed for sleep.  05/30/20   [provider]  TRINTELLIX 20 MG TABS tablet Take 20 mg by mouth daily. 01/22/21   [provider]    Allergies Gabapentin, Ibuprofen, Sulfa antibiotics, Tylenol [acetaminophen], Aspirin, Buprenorphine hcl, Carbamazepine, Codeine, Compazine, Elemental sulfur, Ioxaglate, Ivp dye [iodinated diagnostic agents], Metrizamide, Naproxen, Norco [hydrocodone-acetaminophen], Other, Penicillin g, Prochlorperazine maleate, Reglan [metoclopramide], Toradol [ketorolac tromethamine], Tramadol, and Zofran [ondansetron hcl]  Family History  Problem Relation Age of Onset   Heart failure Mother    Diabetes Father    Cancer Sister     Social History Social History   Tobacco Use   Smoking status: Every Day    Packs/day: 1.00    Years: 1.50    Pack years: 1.50    Types: Cigarettes   Smokeless tobacco: Never  Vaping Use   Vaping Use: Never used  Substance Use Topics   Alcohol use: Not Currently    Comment: no alcohol since 2002   Drug use: No    Review of Systems  Review of Systems  Constitutional:  Negative for chills and fever.  HENT:  Negative for sore throat.   Eyes:  Negative for pain.  Respiratory:  Negative for cough and stridor.   Cardiovascular:  Negative for chest  pain.  Gastrointestinal:  Negative for vomiting.  Musculoskeletal:  Positive for falls.  Skin:  Negative for rash.  Neurological:  Positive for headaches. Negative for seizures and loss of consciousness.  Psychiatric/Behavioral:  Negative for suicidal ideas.   All other systems reviewed and are negative.    ____________________________________________   PHYSICAL EXAM:  VITAL SIGNS: ED Triage Vitals  Enc Vitals Group     BP 07/16/21 1146 119/80     Pulse Rate 07/16/21 1146 79     Resp 07/16/21 1146 16     Temp 07/16/21 1146 97.7 F (36.5 C)     Temp Source 07/16/21 1146 Oral     SpO2 07/16/21 1146 100 %     Weight --      Height 07/16/21 1147 5\' 2"  (1.575 m)     Head Circumference --      Peak Flow --      Pain Score 07/16/21 1147 10     Pain Loc --      Pain Edu? --      Excl. in [place]? --    Vitals:   07/16/21 1146 07/16/21 1459  BP: 119/80 120/78  Pulse: 79 80  Resp: 16 16  Temp: 97.7 F (36.5 C)   SpO2: 100% 100%   Physical Exam Vitals and nursing note reviewed.  Constitutional:      General: She is not in acute distress.    Appearance: She is well-developed.  HENT:     Head: Normocephalic and atraumatic.     Right Ear: External ear normal.     Left Ear: External ear normal.     Nose: Nose normal.  Eyes:     Conjunctiva/sclera: Conjunctivae normal.  Cardiovascular:     Rate and Rhythm: Normal rate and regular rhythm.     Heart sounds: No murmur heard. Pulmonary:     Effort: Pulmonary effort is normal. No respiratory distress.     Breath sounds: Normal breath sounds.  Abdominal:     Palpations: Abdomen is soft.     Tenderness: There is no abdominal tenderness.  Musculoskeletal:     Cervical back: Neck supple.  Skin:    General: Skin is warm and  dry.     Capillary Refill: Capillary refill takes less than 2 seconds.  Neurological:     Mental Status: She is alert and oriented to person, place, and time.  Psychiatric:        Mood and Affect: Mood  normal.    .  Superficial abrasion over the lateral aspect of the right forearm midway between the right elbow and wrist.  Patient otherwise has full strength in her bilateral upper extremities and lower extremities.  There is a small abrasion over the right anterior knee.  There are no effusion or deformities of the bilateral elbows, wrists, knees or ankles.  2+ radial and DP pulses.  Sensation is intact to light touch throughout all extremities.  There is no tenderness step-offs deformities over the C/T/L-spine.  No pronator drift or finger dysmetria.  There is some tenderness over the nasal bridge which is a little bit of erythema and edema.  There is also some tenderness over the bilateral maxillary areas.  Oropharynx is unremarkable.  There are some dried blood visible in both nares without any evidence of septal hematoma on external exam.  Patient seem to have a little swelling around her left eye at the left eyebrow not able to elevate to the same degree as the right eyebrow.  Cranial nerves II through XII are otherwise grossly intact. ____________________________________________   LABS (all labs ordered are listed, but only abnormal results are displayed)  Labs Reviewed  URINALYSIS, COMPLETE (UACMP) WITH MICROSCOPIC - Abnormal; Notable for the following components:      Result Value   Color, Urine AMBER (*)    APPearance CLOUDY (*)    Protein, ur 30 (*)    Leukocytes,Ua LARGE (*)    WBC, UA >50 (*)    Bacteria, UA FEW (*)    All other components within normal limits  URINE CULTURE  CBC WITH DIFFERENTIAL/PLATELET  COMPREHENSIVE METABOLIC PANEL  TSH  TROPONIN I (HIGH SENSITIVITY)   ____________________________________________  EKG  ECG remarkable for sinus rhythm with a first-degree AV block with a PR interval of 210, otherwise unremarkable intervals and fairly low amplitude several leads without clear evidence of acute ischemia or significant  arrhythmia. ____________________________________________  RADIOLOGY  ED MD interpretation: CT head, face and C-spine show evidence of prior CVA but no evidence of acute facial fracture, skull fracture, acute intracranial hemorrhage or acute ischemia, edema, ventriculomegaly or acute C-spine injury.  MR brain shows no evidence of any recent or acute infarct hemorrhage or mass-effect.  Evidence of prior strokes is noted.  Official radiology report(s): CT Head Wo Contrast  Result Date: 07/16/2021 CLINICAL DATA:  Nasal and left facial swelling following a fall onto her face this morning. Concussion diagnosed after a fall last week. EXAM: CT HEAD WITHOUT CONTRAST CT MAXILLOFACIAL WITHOUT CONTRAST CT CERVICAL SPINE WITHOUT CONTRAST TECHNIQUE: Multidetector CT imaging of the head, cervical spine, and maxillofacial structures were performed using the standard protocol without intravenous contrast. Multiplanar CT image reconstructions of the cervical spine and maxillofacial structures were also generated. COMPARISON:  Head and cervical spine CT dated 07/14/2021. Maxillofacial CT dated 05/11/2020. FINDINGS: CT HEAD FINDINGS Brain: Stable old left posterior watershed distribution cerebral infarct. Stable normal size and position of the ventricles. No intracranial hemorrhage, mass lesion or CT evidence of acute infarction. Vascular: No hyperdense vessel or unexpected calcification. Skull: Normal. Negative for fracture or focal lesion. Other: None. CT MAXILLOFACIAL FINDINGS Osseous: No fracture or mandibular dislocation. No destructive process. Orbits: Negative. No traumatic or  inflammatory finding. Sinuses: Stable bilateral maxillary sinus retention cysts and surgical absence of the medial wall of the right maxillary sinus. Soft tissues: Unremarkable. CT CERVICAL SPINE FINDINGS Alignment: Stable straightening of the normal cervical lordosis without subluxation. Skull base and vertebrae: No acute fracture. No primary  bone lesion or focal pathologic process. Soft tissues and spinal canal: No prevertebral fluid or swelling. No visible canal hematoma. Disc levels:  Stable degenerative changes at multiple levels. Upper chest: Clear lung apices. Other: Bilateral carotid artery atheromatous calcifications. IMPRESSION: 1. No skull fracture or intracranial hemorrhage. 2. No maxillofacial fracture. 3. No cervical spine fracture or subluxation. 4. Stable old left posterior watershed cerebral infarct. Electronically Signed   By: Claudie Revering M.D.   On: 07/16/2021 12:29   CT Cervical Spine Wo Contrast  Result Date: 07/16/2021 CLINICAL DATA:  Nasal and left facial swelling following a fall onto her face this morning. Concussion diagnosed after a fall last week. EXAM: CT HEAD WITHOUT CONTRAST CT MAXILLOFACIAL WITHOUT CONTRAST CT CERVICAL SPINE WITHOUT CONTRAST TECHNIQUE: Multidetector CT imaging of the head, cervical spine, and maxillofacial structures were performed using the standard protocol without intravenous contrast. Multiplanar CT image reconstructions of the cervical spine and maxillofacial structures were also generated. COMPARISON:  Head and cervical spine CT dated 07/14/2021. Maxillofacial CT dated 05/11/2020. FINDINGS: CT HEAD FINDINGS Brain: Stable old left posterior watershed distribution cerebral infarct. Stable normal size and position of the ventricles. No intracranial hemorrhage, mass lesion or CT evidence of acute infarction. Vascular: No hyperdense vessel or unexpected calcification. Skull: Normal. Negative for fracture or focal lesion. Other: None. CT MAXILLOFACIAL FINDINGS Osseous: No fracture or mandibular dislocation. No destructive process. Orbits: Negative. No traumatic or inflammatory finding. Sinuses: Stable bilateral maxillary sinus retention cysts and surgical absence of the medial wall of the right maxillary sinus. Soft tissues: Unremarkable. CT CERVICAL SPINE FINDINGS Alignment: Stable straightening of the  normal cervical lordosis without subluxation. Skull base and vertebrae: No acute fracture. No primary bone lesion or focal pathologic process. Soft tissues and spinal canal: No prevertebral fluid or swelling. No visible canal hematoma. Disc levels:  Stable degenerative changes at multiple levels. Upper chest: Clear lung apices. Other: Bilateral carotid artery atheromatous calcifications. IMPRESSION: 1. No skull fracture or intracranial hemorrhage. 2. No maxillofacial fracture. 3. No cervical spine fracture or subluxation. 4. Stable old left posterior watershed cerebral infarct. Electronically Signed   By: Claudie Revering M.D.   On: 07/16/2021 12:29   MR BRAIN WO CONTRAST  Result Date: 07/16/2021 CLINICAL DATA:  Neuro deficit, acute, stroke suspected EXAM: MRI HEAD WITHOUT CONTRAST TECHNIQUE: Multiplanar, multiecho pulse sequences of the brain and surrounding structures were obtained without intravenous contrast. COMPARISON:  March 2022 FINDINGS: Brain: There is no acute infarction or intracranial hemorrhage. There is no intracranial mass, mass effect, or edema. There is no hydrocephalus or extra-axial fluid collection. Encephalomalacia involving posterior left temporal and lateral occipital lobes with chronic blood products. Ventricles and sulci are within normal limits in size and configuration apart from ex vacuo dilatation of the posterior left lateral ventricle. Unchanged patchy additional foci of T2 hyperintensity in the supratentorial white matter that may reflect minor chronic microvascular ischemic changes. Vascular: Major vessel flow voids at the skull base are preserved. Skull and upper cervical spine: Normal marrow signal is preserved. Sinuses/Orbits: Minor mucosal thickening.  Orbits are unremarkable. Other: Sella is partially empty.  Mastoid air cells are clear. IMPRESSION: No evidence of recent infarction, hemorrhage, or mass. Stable chronic findings detailed above. Electronically  Signed   By: Guadlupe Spanish M.D.   On: 07/16/2021 16:52   CT Maxillofacial Wo Contrast  Result Date: 07/16/2021 CLINICAL DATA:  Nasal and left facial swelling following a fall onto her face this morning. Concussion diagnosed after a fall last week. EXAM: CT HEAD WITHOUT CONTRAST CT MAXILLOFACIAL WITHOUT CONTRAST CT CERVICAL SPINE WITHOUT CONTRAST TECHNIQUE: Multidetector CT imaging of the head, cervical spine, and maxillofacial structures were performed using the standard protocol without intravenous contrast. Multiplanar CT image reconstructions of the cervical spine and maxillofacial structures were also generated. COMPARISON:  Head and cervical spine CT dated 07/14/2021. Maxillofacial CT dated 05/11/2020. FINDINGS: CT HEAD FINDINGS Brain: Stable old left posterior watershed distribution cerebral infarct. Stable normal size and position of the ventricles. No intracranial hemorrhage, mass lesion or CT evidence of acute infarction. Vascular: No hyperdense vessel or unexpected calcification. Skull: Normal. Negative for fracture or focal lesion. Other: None. CT MAXILLOFACIAL FINDINGS Osseous: No fracture or mandibular dislocation. No destructive process. Orbits: Negative. No traumatic or inflammatory finding. Sinuses: Stable bilateral maxillary sinus retention cysts and surgical absence of the medial wall of the right maxillary sinus. Soft tissues: Unremarkable. CT CERVICAL SPINE FINDINGS Alignment: Stable straightening of the normal cervical lordosis without subluxation. Skull base and vertebrae: No acute fracture. No primary bone lesion or focal pathologic process. Soft tissues and spinal canal: No prevertebral fluid or swelling. No visible canal hematoma. Disc levels:  Stable degenerative changes at multiple levels. Upper chest: Clear lung apices. Other: Bilateral carotid artery atheromatous calcifications. IMPRESSION: 1. No skull fracture or intracranial hemorrhage. 2. No maxillofacial fracture. 3. No cervical spine fracture or  subluxation. 4. Stable old left posterior watershed cerebral infarct. Electronically Signed   By: Beckie Salts M.D.   On: 07/16/2021 12:29    ____________________________________________   PROCEDURES  Procedure(s) performed (including Critical Care):  Procedures   ____________________________________________   INITIAL IMPRESSION / ASSESSMENT AND PLAN / ED COURSE      Patient presents for assessment after a fall described above.  This is her fourth fall patient has had in the last week she states she has had several more earlier this past month.  She states that the fall today occurred because she tripped over her dog.  She is complaining of headache and some pain around her nose.  On arrival she is afebrile and hemodynamically stable.  She does have some erythema and tenderness over the nasal bridge and a little bit of asymmetry with the left eyebrow looking a little swollen and patient unable to elevate to the previous right eyebrow.  Otherwise she seems neurovascular intact with an abrasion over her right knee and right forearm without any other significant injuries noted on exam.  Her low suspicion for significant occult visceral injury.  CT head, face and C-spine show evidence of prior CVA but no evidence of acute facial fracture, skull fracture, acute intracranial hemorrhage or acute ischemia, edema, ventriculomegaly or acute C-spine injury.  MR brain shows no evidence of acute CVA or other acute process and I suspect some significant eyebrows may be due to facial trauma and some swelling.  Impression is contusion to the nose eyebrows.  With regard to the etiology of patient's recurrent falls she is describing mechanical mechanisms although given number of falls additional differential considerations include possible arrhythmia, anemia, metabolic derangements, polypharmacy and effects of patient's chronic benzodiazepine use, weakness from acute infectious process such as a UTI and  endocrine derangements.  EKG with some nonspecific findings and  given nonelevated troponin and I have low suspicion for ACS.  CBC shows no leukocytosis or acute anemia.  CMP shows no significant electrolyte or metabolic derangements.  UA is consistent with cystitis with 30 protein, large leukocyte esterase and 3-50 WBCs and some bacteria noted.  Urine culture sent and patient given dose of Rocephin pending remainder of work-up.    I do not believe patient is septic from this is likely contributing factor to her recent falls.  Discussed at length with the patient my concerns for her Klonopin use and I offered and recommended admission for medication management and for her to undergo PT OT evaluations.  Patient states she strongly defers to go home and follow-up with her PCP.  Given stable vitals without evidence of objective deficits and low suspicion for sepsis or CVA I think this is reasonable.  Patient is alert and oriented I think she has capacity to make this choice.  Discharged in stable condition.  She was able to ambulate unassisted prior to discharge.     ____________________________________________   FINAL CLINICAL IMPRESSION(S) / ED DIAGNOSES  Final diagnoses:  Recurrent falls  Acute cystitis without hematuria  Contusion of face, initial encounter    Medications  cefTRIAXone (ROCEPHIN) 1 g in sodium chloride 0.9 % 100 mL IVPB (has no administration in time range)     ED Discharge Orders          Ordered    nitrofurantoin, macrocrystal-monohydrate, (MACROBID) 100 MG capsule  2 times daily        07/16/21 1702             Note:  This document was prepared using Dragon voice recognition software and may include unintentional dictation errors.    Lucrezia Starch, MD 07/16/21 [phone]

## 2021-07-16 NOTE — ED Triage Notes (Signed)
Pt to ED via EMS with c/o a fall, she tripped over her dog this am. She fell on her face, her nose appears to be swollen. She also fell last week and was diagnosised with a concussion. She also has has swelling on the left side of her face.

## 2021-07-17 LAB — URINE CULTURE

## 2021-07-24 ENCOUNTER — Other Ambulatory Visit: Payer: Self-pay | Admitting: Neurology

## 2021-08-16 IMAGING — CR DG ANKLE COMPLETE 3+V*L*
3 series · 3 of 3 positions shown · non-contrast
Comparison: 06/16/2019, 05/21/1999

CLINICAL DATA: Medial malleolus fracture, pain

EXAM:
LEFT FOOT - COMPLETE 3+ VIEW; LEFT ANKLE COMPLETE - 3+ VIEW

[ankle ap]
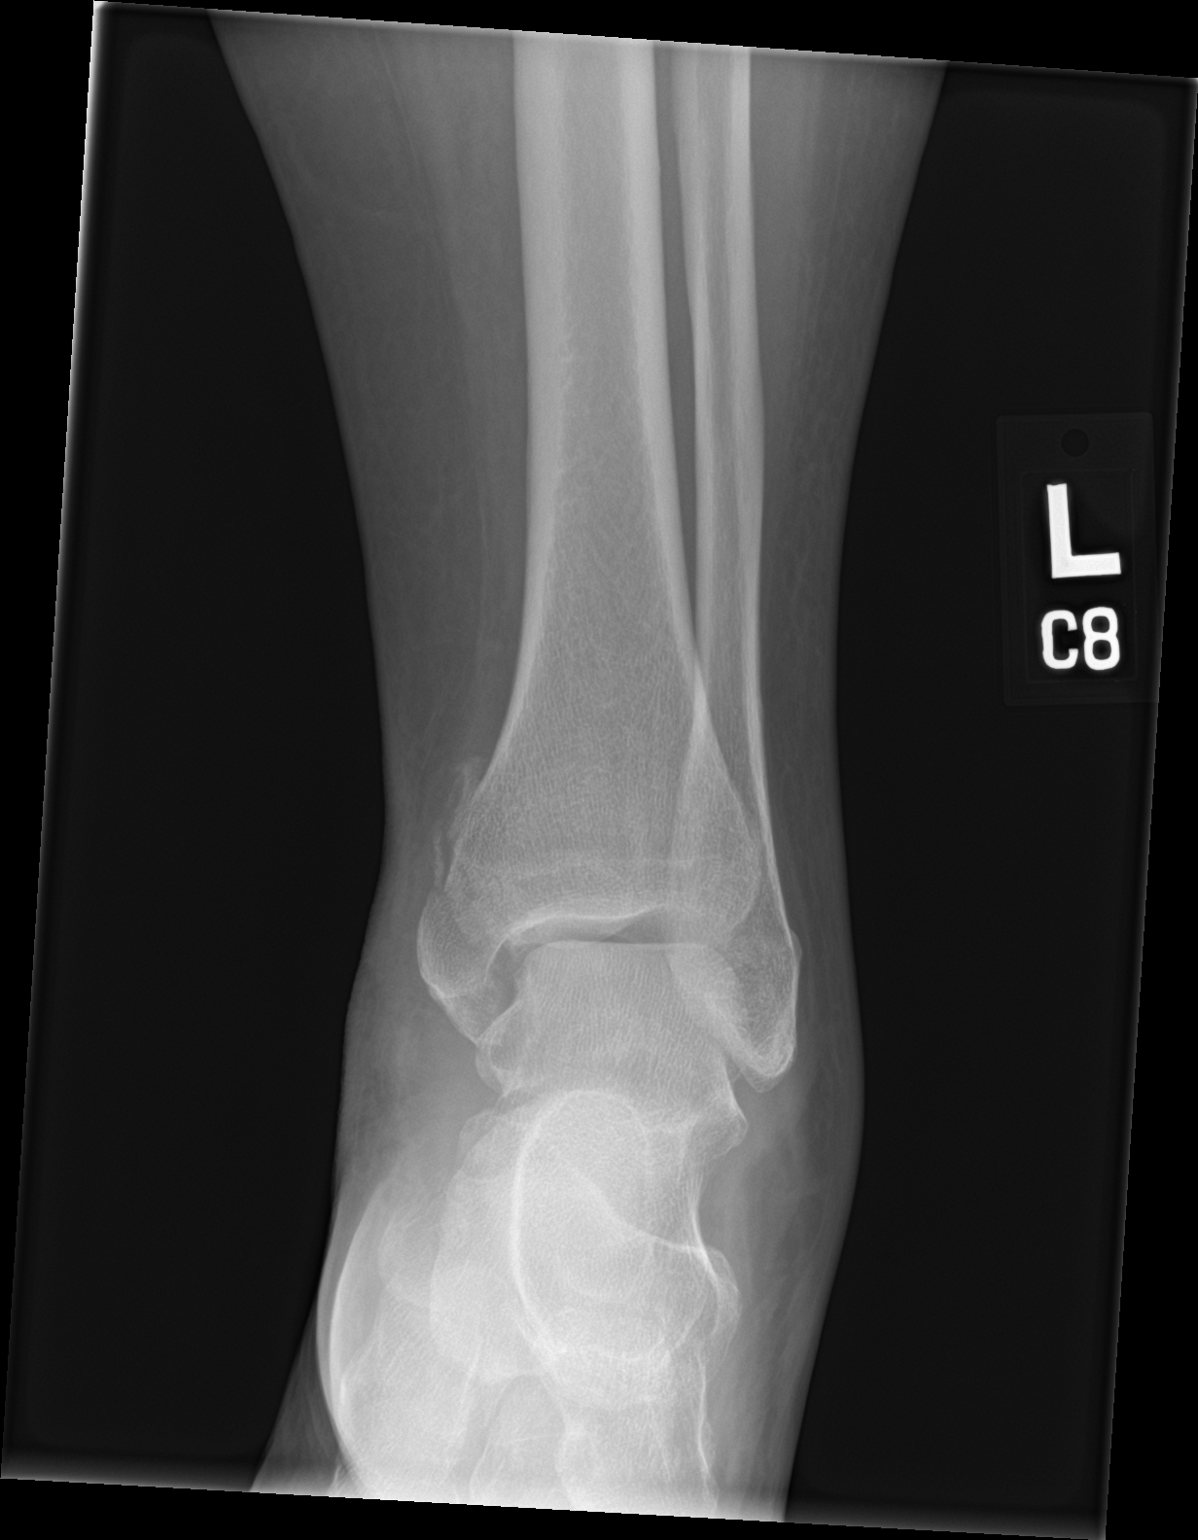

[ankle obl]
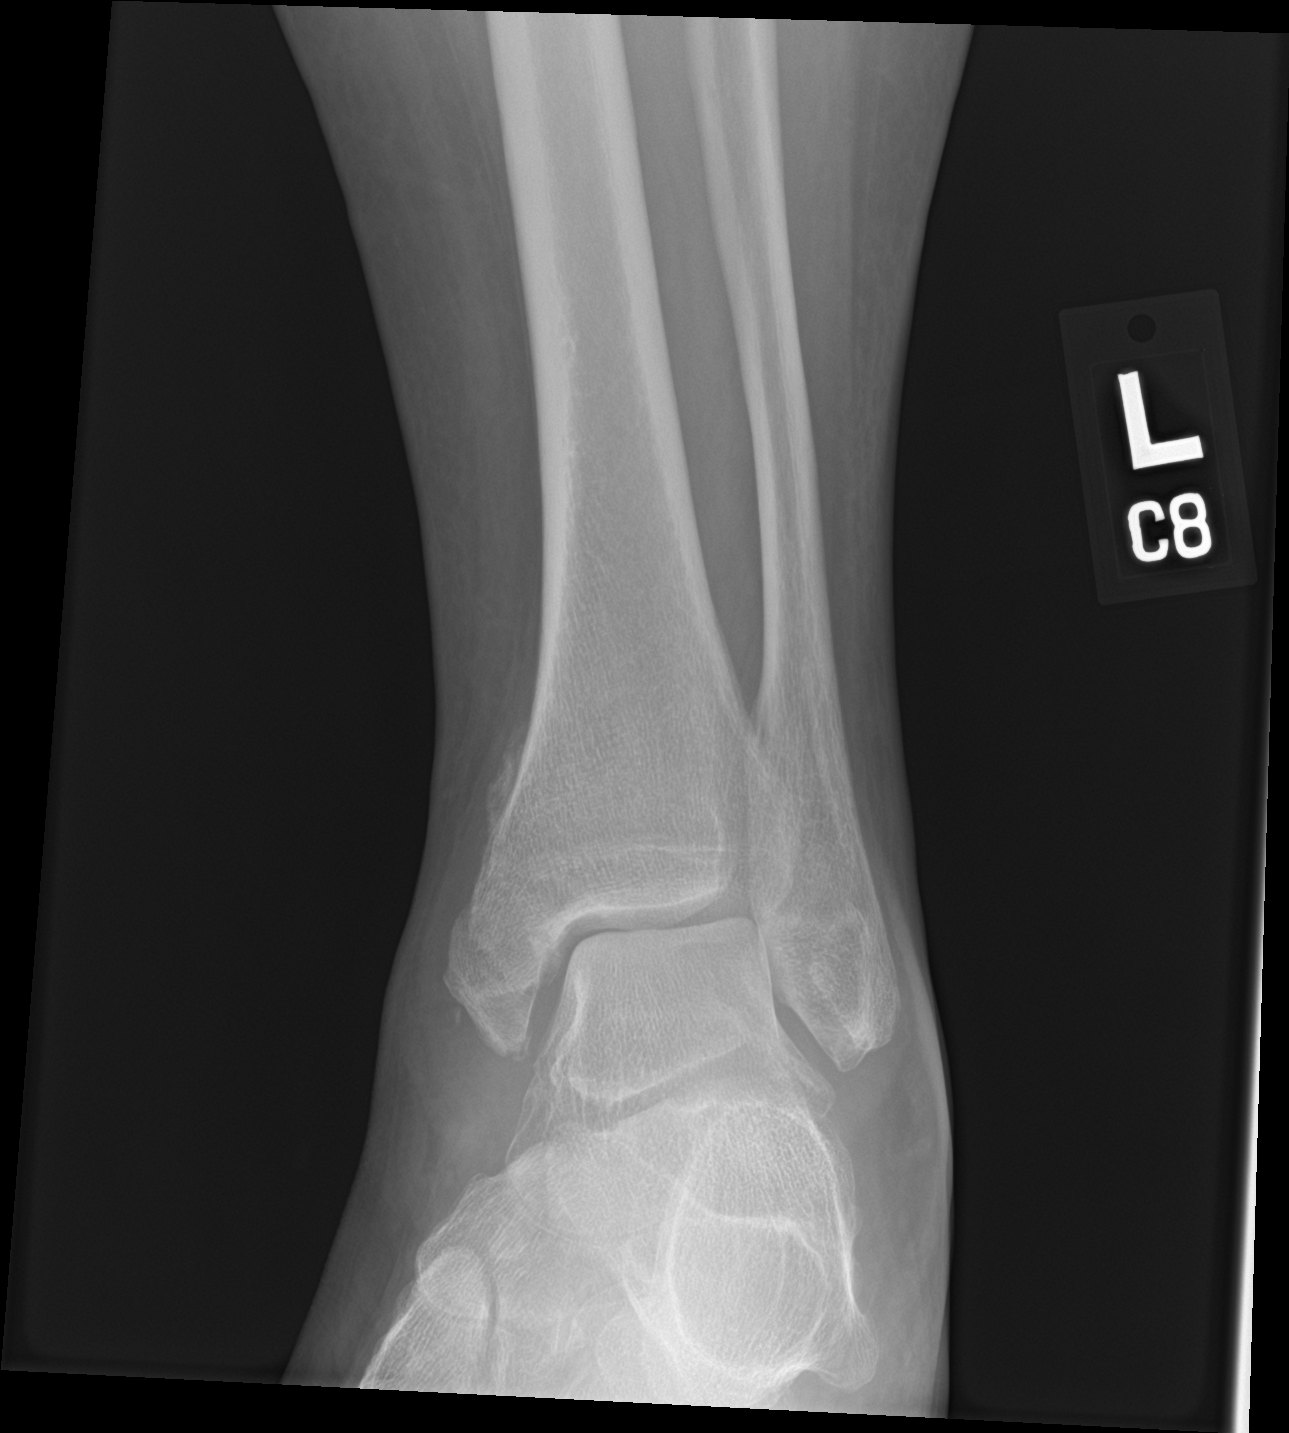

[ankle lat]
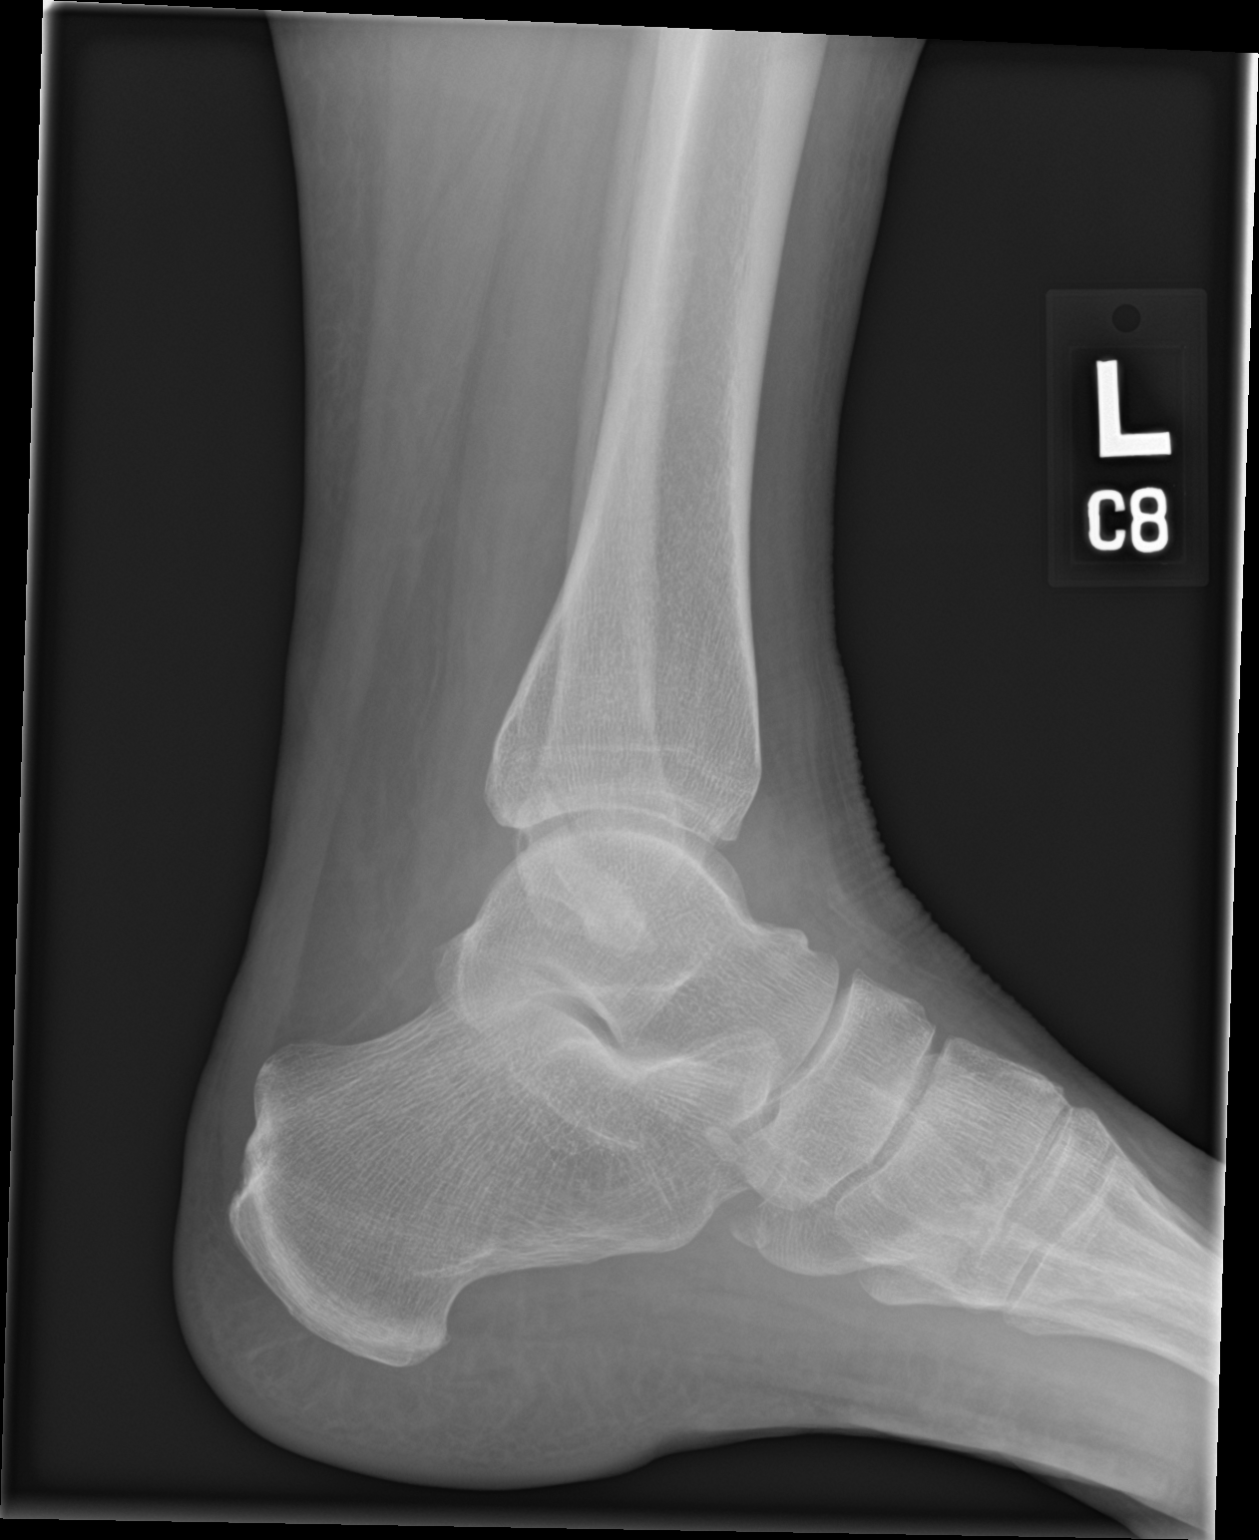

[3 of 3 positions shown; findings below may reference images not displayed]

FINDINGS: Redemonstrated, mildly displaced fracture of the left medial
malleolus of the ankle, with interval formation of some bony callus
and adjacent heterotopic ossification, consistent with early
healing. No other fracture or dislocation of the left foot or ankle.
There is a redemonstrated, possible periarticular erosion of the
left fifth proximal phalanx abutting the proximal interphalangeal
joint. Joint spaces are well preserved. Mild soft tissue edema about
the left ankle.
IMPRESSION: 1. Redemonstrated, mildly displaced fracture of the left medial
malleolus of the ankle, with interval formation of some bony callus
and adjacent heterotopic ossification, consistent with early
healing.

2.  No other fracture or dislocation of the left foot or ankle.

3. Mild soft tissue edema about the left ankle.

## 2021-08-16 IMAGING — CR DG FOOT COMPLETE 3+V*L*
3 series · 3 of 3 positions shown · non-contrast
Comparison: 06/16/2019, 05/21/1999

CLINICAL DATA: Medial malleolus fracture, pain

EXAM:
LEFT FOOT - COMPLETE 3+ VIEW; LEFT ANKLE COMPLETE - 3+ VIEW

[foot obl (1 of 2)]
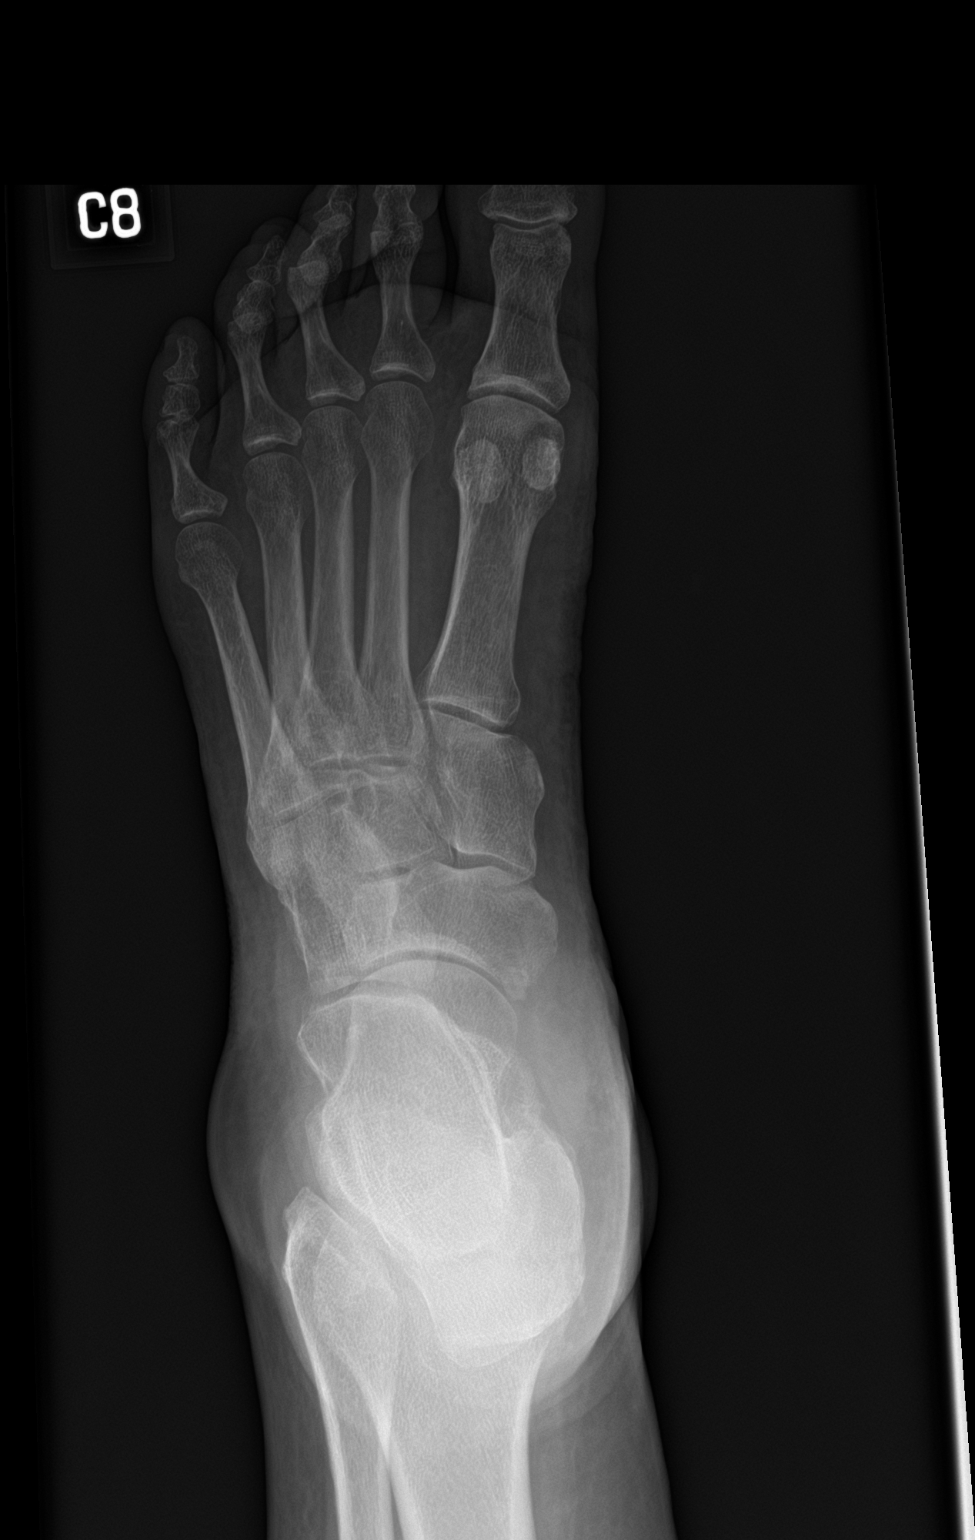

[foot lat]
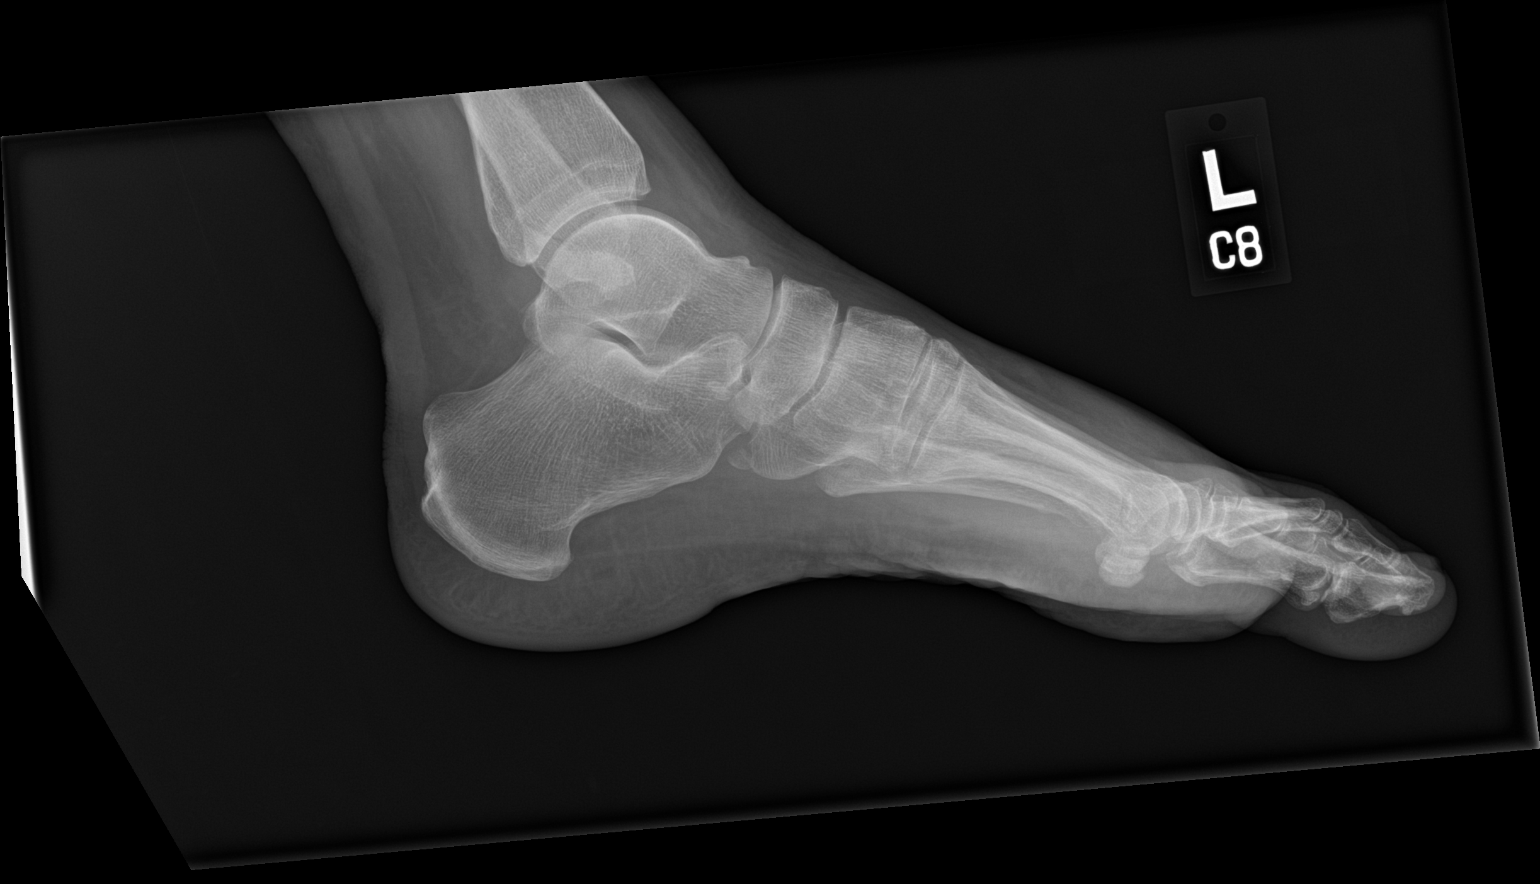

[foot obl (2 of 2)]
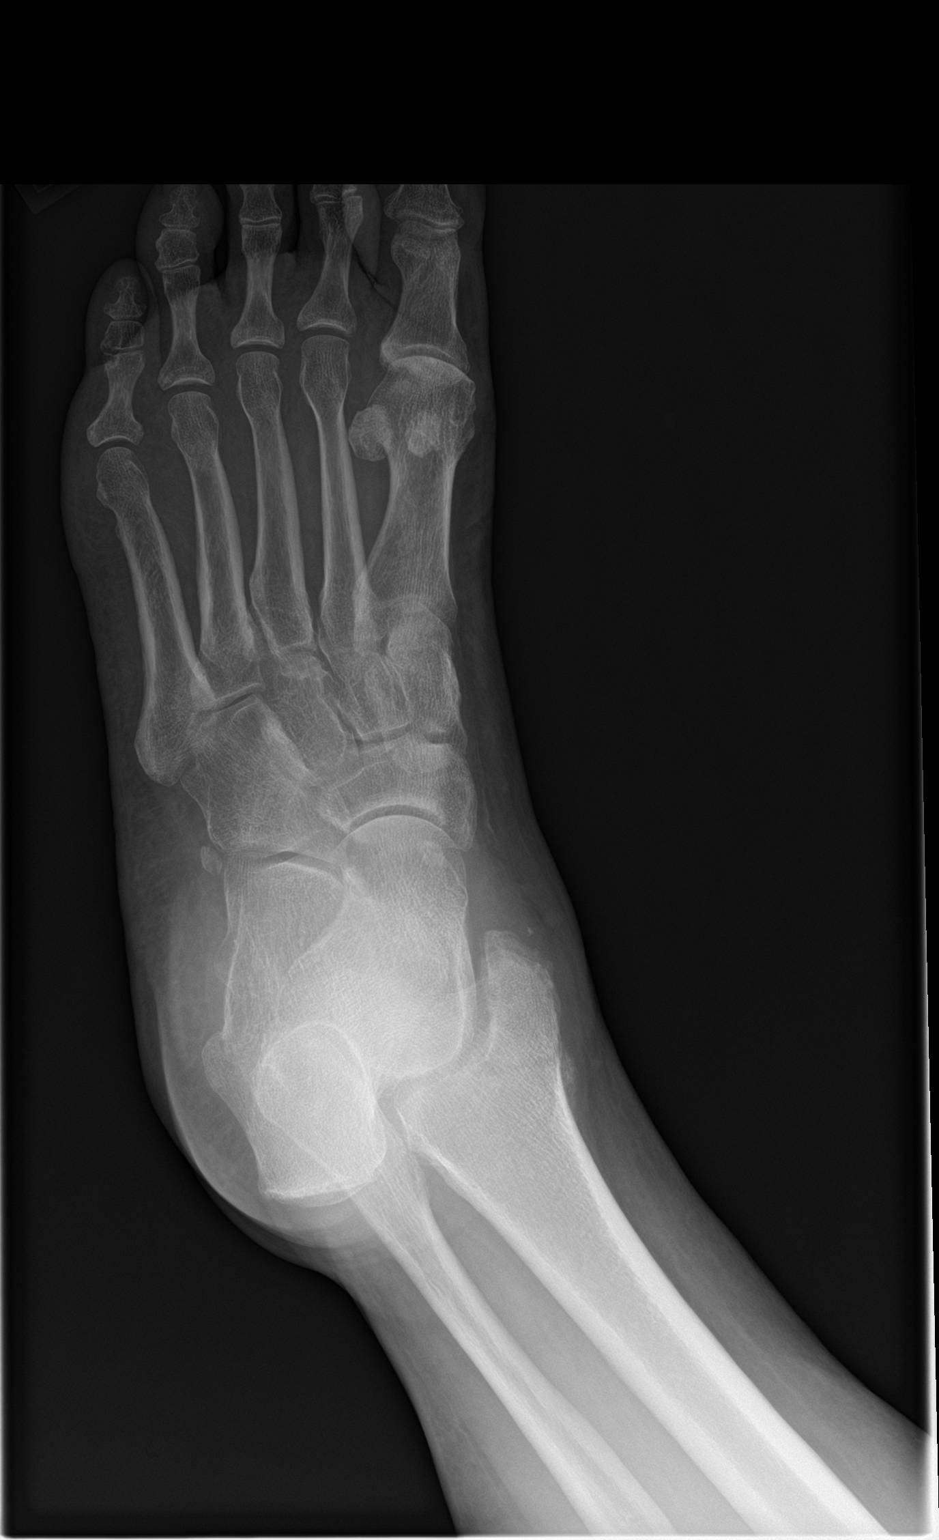

[3 of 3 positions shown; findings below may reference images not displayed]

FINDINGS: Redemonstrated, mildly displaced fracture of the left medial
malleolus of the ankle, with interval formation of some bony callus
and adjacent heterotopic ossification, consistent with early
healing. No other fracture or dislocation of the left foot or ankle.
There is a redemonstrated, possible periarticular erosion of the
left fifth proximal phalanx abutting the proximal interphalangeal
joint. Joint spaces are well preserved. Mild soft tissue edema about
the left ankle.
IMPRESSION: 1. Redemonstrated, mildly displaced fracture of the left medial
malleolus of the ankle, with interval formation of some bony callus
and adjacent heterotopic ossification, consistent with early
healing.

2.  No other fracture or dislocation of the left foot or ankle.

3. Mild soft tissue edema about the left ankle.

## 2021-08-18 ENCOUNTER — Emergency Department (HOSPITAL_COMMUNITY)
Admission: EM | Admit: 2021-08-18 | Discharge: 2021-08-18 | Disposition: A | Discharge status: Home / Self Care | Payer: Medicaid Other | Attending: Emergency Medicine | Admitting: Emergency Medicine

## 2021-08-18 ENCOUNTER — Emergency Department (HOSPITAL_COMMUNITY): Payer: Medicaid Other

## 2021-08-18 ENCOUNTER — Other Ambulatory Visit: Payer: Self-pay

## 2021-08-18 ENCOUNTER — Encounter (HOSPITAL_COMMUNITY): Payer: Self-pay | Admitting: Emergency Medicine

## 2021-08-18 DIAGNOSIS — W1839XA Other fall on same level, initial encounter: Secondary | ICD-10-CM | POA: Insufficient documentation

## 2021-08-18 DIAGNOSIS — I10 Essential (primary) hypertension: Secondary | ICD-10-CM | POA: Insufficient documentation

## 2021-08-18 DIAGNOSIS — E119 Type 2 diabetes mellitus without complications: Secondary | ICD-10-CM | POA: Diagnosis not present

## 2021-08-18 DIAGNOSIS — M79602 Pain in left arm: Secondary | ICD-10-CM

## 2021-08-18 DIAGNOSIS — M79622 Pain in left upper arm: Secondary | ICD-10-CM | POA: Diagnosis not present

## 2021-08-18 DIAGNOSIS — F17210 Nicotine dependence, cigarettes, uncomplicated: Secondary | ICD-10-CM | POA: Insufficient documentation

## 2021-08-18 DIAGNOSIS — Z79899 Other long term (current) drug therapy: Secondary | ICD-10-CM | POA: Insufficient documentation

## 2021-08-18 MED ORDER — METHOCARBAMOL 500 MG PO TABS: 500.0000 mg | ORAL_TABLET | Freq: Two times a day (BID) | ORAL | 0 refills | Status: DC

## 2021-08-18 MED ORDER — OXYCODONE-ACETAMINOPHEN 5-325 MG PO TABS
2.0000 | ORAL_TABLET | Freq: Once | ORAL | Status: AC
  Administered 2021-08-18: 2 via ORAL
  Filled 2021-08-18: qty 2

## 2021-08-18 NOTE — ED Provider Notes (Signed)
Emergency Medicine Provider Triage Evaluation Note  Stephanie [name] , a 55 y.o. female  was evaluated in triage.  Pt complains of left arm pain  Review of Systems  Positive: Tender forearm and mid upper arm Negative:   Physical Exam  BP (!) 134/91 (BP Location: Right Arm)   Pulse 100   Temp 97.9 F (36.6 C) (Oral)   Resp 18   Ht 5\' 2"  (1.575 m)   Wt 95 kg   SpO2 94%   BMI 38.31 kg/m  Gen:   Awake, no distress   Resp:  Normal effort  MSK:   Moves extremities without difficulty  Other:  Tender midforearm to shoulder   Medical Decision Making  Medically screening exam initiated at 2:11 PM.  Appropriate orders placed.  Stephanie [name] was informed that the remainder of the evaluation will be completed by another provider, this initial triage assessment does not replace that evaluation, and the importance of remaining in the ED until their evaluation is complete.     [name], Elson Areas 08/18/21 1412    14/10/22, MD 08/18/21 2251

## 2021-08-18 NOTE — Discharge Instructions (Addendum)
Please use Tylenol or ibuprofen for pain.  You may use 600 mg ibuprofen every 6 hours or 1000 mg of Tylenol every 6 hours.  You may choose to alternate between the 2.  This would be most effective.  Not to exceed 4 g of Tylenol within 24 hours.  Not to exceed 3200 mg ibuprofen 24 hours.  You can take the muscle relaxant in addition to the above up to twice daily. 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 Please follow up with orthopedics for further evaluation at your earliest convenience. You can keep it in the sling, ice the painful areas, and I recommend resting it unless you must use it.

## 2021-08-18 NOTE — ED Notes (Signed)
Ortho called for immobilizer 

## 2021-08-18 NOTE — ED Triage Notes (Signed)
Patient states she fell last week and landed on her L arm, has had L arm pain since then, L wrist pain, reports pain is the worst in her L shoulder. ROM intact but limited by pain.

## 2021-08-18 NOTE — Progress Notes (Signed)
Orthopedic Tech Progress Note Patient Details:  Stephanie [name] May 16, 1966 [id number]  Ortho Devices Type of Ortho Device: Shoulder immobilizer Ortho Device/Splint Location: Left Arm Ortho Device/Splint Interventions: Application   Post Interventions Patient Tolerated: Well  Genelle Bal Merion Caton 08/18/2021, 3:49 PM

## 2021-08-18 NOTE — ED Provider Notes (Signed)
[facility]-EMERGENCY DEPT Provider Note   CSN: [id number] Arrival date & time: 08/18/21  1328     History No chief complaint on file.   Stephanie [name] is a 55 y.o. female with no significant past medical history who presents with left arm pain extending from the shoulder down to the wrist after a fall around 2 weeks ago.  Patient reports that she had a mechanical, nonsyncopal fall, caught herself on her left arm and has been experiencing pain in the arm ever since.  Patient denies any numbness or tingling of the arm.  Patient has been trying Advil, Excedrin, Tylenol for the pain with minimal relief.  Patient rates the pain as 9/10 at this time, reports worse with movement of the arm, and decreased range of motion secondary pain.  Patient denies any swelling of the arm.  No history of injury to the same arm.  HPI     Past Medical History:  Diagnosis Date   Diabetes mellitus without complication (HCC)    Difficult intubation    Diverticulitis    Headache    Hepatitis C    treated and resolved   History of kidney stones    Hypertension    IBS (irritable bowel syndrome)    Kidney stones    Sciatica    Sleep apnea    on cpap   Stroke (HCC) 2017   memory loss    Patient Active Problem List   Diagnosis Date Noted   Hypotension 08/22/2018   Bradycardia 08/22/2018   Type 2 diabetes mellitus (HCC) 08/22/2018   Acute encephalopathy 08/21/2018   Shoulder pain, right 12/22/2017   UTI (urinary tract infection) 08/21/2017   Nausea & vomiting 08/21/2017   Nicotine dependence 08/21/2017   Headache 01/07/2017   Small bowel obstruction (HCC) 09/02/2016   Pain and swelling of left upper extremity 07/22/2016   Superficial venous thrombosis of arm, left 07/22/2016   ICH (intracerebral hemorrhage) (HCC)    Essential hypertension, malignant 04/19/2016   Cytotoxic brain edema (HCC) 04/19/2016   IVH (intraventricular hemorrhage) (HCC) 04/18/2016   Hepatitis C      Past Surgical History:  Procedure Laterality Date   ABDOMINAL HYSTERECTOMY     carpel tunnel syndrome  2017   HARDWARE REMOVAL Right 12/22/2017   Procedure: HARDWARE REMOVAL-RIGHT ARM;  Surgeon: Lyndle Herrlich, MD;  Location: [facility] ORS;  Service: Orthopedics;  Laterality: Right;  Removal of implant, superficial right humerus   HUMERUS IM NAIL Right 03/11/2017   Procedure: INTRAMEDULLARY (IM) NAIL HUMERAL;  Surgeon: Lyndle Herrlich, MD;  Location: [facility] ORS;  Service: Orthopedics;  Laterality: Right;   KIDNEY STONE SURGERY Right 02/12/12   KIDNEY STONE SURGERY Left 07/15/2012   LIGATION OF ARTERIOVENOUS  FISTULA Left 06/21/2016   Procedure: LIGATION OF ARTERIOVENOUS  FISTULA ( LIGATION BASILIC VEIN );  Surgeon: Renford Dills, MD;  Location: [facility] ORS;  Service: Vascular;  Laterality: Left;   LITHOTRIPSY     OOPHORECTOMY     SINUS EXPLORATION     TONSILLECTOMY       OB History   No obstetric history on file.     Family History  Problem Relation Age of Onset   Heart failure Mother    Diabetes Father    Cancer Sister     Social History   Tobacco Use   Smoking status: Every Day    Packs/day: 1.00    Years: 1.50    Pack years: 1.50  Types: Cigarettes   Smokeless tobacco: Never  Vaping Use   Vaping Use: Never used  Substance Use Topics   Alcohol use: Not Currently    Comment: no alcohol since 2002   Drug use: No    Home Medications Prior to Admission medications   Medication Sig Start Date End Date Taking? Authorizing Provider  methocarbamol (ROBAXIN) 500 MG tablet Take 1 tablet (500 mg total) by mouth 2 (two) times daily. 08/18/21  Yes Siddalee Vanderheiden H, PA-C  ARIPiprazole (ABILIFY) 15 MG tablet Take 15 mg by mouth every morning. 06/10/21   [provider]  budesonide-formoterol (SYMBICORT) 160-4.5 MCG/ACT inhaler Inhale 2 puffs into the lungs 2 (two) times daily.    [provider]  CAPLYTA 42 MG capsule Take 42 mg by mouth every evening.  06/08/21   [provider]  cetirizine (ZYRTEC) 10 MG tablet Take 10 mg by mouth at bedtime as needed for allergies. 01/07/21   [provider]  cholecalciferol (VITAMIN D3) 25 MCG (1000 UT) tablet Take 1,000 Units by mouth daily.    [provider]  Cyanocobalamin (VITAMIN B-12 PO) Take 1 tablet by mouth daily.    [provider]  diazepam (VALIUM) 10 MG tablet Take 10 mg by mouth every 8 (eight) hours as needed for anxiety. 01/23/21   [provider]  Fremanezumab-vfrm (AJOVY) 225 MG/1.5ML SOAJ Inject 225 mg into the skin every 30 (thirty) days. 10/17/20   Butch Penny, NP  furosemide (LASIX) 40 MG tablet Take 40 mg by mouth daily as needed for edema. 01/23/21   [provider]  glipiZIDE (GLUCOTROL) 5 MG tablet Take 1 tablet (5 mg total) by mouth 2 (two) times daily before a meal. 04/30/16   Lora Paula, MD  KLOR-CON M10 10 MEQ tablet Take 10 mEq by mouth daily as needed (when taking furosemide). 01/23/21   [provider]  lamoTRIgine (LAMICTAL) 100 MG tablet Take 1 tablet (100 mg total) by mouth 2 (two) times daily. 06/20/20   York Spaniel, MD  levETIRAcetam (KEPPRA) 750 MG tablet Take 1 tablet by mouth twice daily Patient taking differently: Take 750 mg by mouth 2 (two) times daily. 01/08/21   York Spaniel, MD  losartan (COZAAR) 100 MG tablet Take 100 mg by mouth daily. 01/23/21   [provider]  metoprolol succinate (TOPROL-XL) 25 MG 24 hr tablet Take 25 mg by mouth daily. 11/12/20   [provider]  omeprazole (PRILOSEC) 40 MG capsule Take 40 mg by mouth daily. 08/13/19   [provider]  phentermine (ADIPEX-P) 37.5 MG tablet Take 37.5 mg by mouth every morning. 01/23/21   [provider]  prazosin (MINIPRESS) 1 MG capsule Take 3 mg by mouth every evening. 02/15/21   [provider]  pregabalin (LYRICA) 150 MG capsule Take 150 mg by mouth at bedtime. 12/05/20   [provider]  Rimegepant Sulfate (NURTEC) 75 MG TBDP Take 1 tablet daily onset of migraine. Patient taking differently: Take 75 mg by mouth daily as needed (onset of migraine). 07/20/20   Butch Penny, NP  traZODone (DESYREL) 150 MG tablet Take 150 mg by mouth at bedtime as needed for sleep.  05/30/20   [provider]  TRINTELLIX 20 MG TABS tablet Take 20 mg by mouth daily. 01/22/21   [provider]    Allergies    Gabapentin, Ibuprofen, Sulfa antibiotics, Tylenol [acetaminophen], Aspirin, Buprenorphine hcl, Carbamazepine, Codeine, Compazine, Elemental sulfur, Ioxaglate, Ivp dye [iodinated diagnostic agents],  Metrizamide, Naproxen, Norco [hydrocodone-acetaminophen], Other, Penicillin g, Prochlorperazine maleate, Reglan [metoclopramide], Toradol [ketorolac tromethamine], Tramadol, and Zofran [ondansetron hcl]  Review of Systems   Review of Systems  Musculoskeletal:  Positive for myalgias.  All other systems reviewed and are negative.  Physical Exam Updated Vital Signs BP (!) 134/91 (BP Location: Right Arm)   Pulse 100   Temp 97.9 F (36.6 C) (Oral)   Resp 18   Ht 5\' 2"  (1.575 m)   Wt 95 kg   SpO2 94%   BMI 38.31 kg/m   Physical Exam Vitals and nursing note reviewed.  Constitutional:      General: She is not in acute distress.    Appearance: Normal appearance.  HENT:     Head: Normocephalic and atraumatic.  Eyes:     General:        Right eye: No discharge.        Left eye: No discharge.  Cardiovascular:     Rate and Rhythm: Normal rate and regular rhythm.     Pulses: Normal pulses.     Comments: Intact radial, ulnar pulses on the left Pulmonary:     Effort: Pulmonary effort is normal. No respiratory distress.  Musculoskeletal:        General: No deformity.     Comments: Generalized tenderness to palpation extending from the left shoulder down the left upper arm, into the antecubital fossa, and down to the left wrist.  There is no step-off or deformity along  the entire arm.  There is no evidence of redness.  There is no evidence of swelling.  Patient has intact passive range of motion, decreased active range of motion secondary to pain.  Patient has intact strength of perhaps 4/5 of the left upper extremity secondary to effort after pain.  Skin:    General: Skin is warm and dry.     Capillary Refill: Capillary refill takes less than 2 seconds.  Neurological:     Mental Status: She is alert and oriented to person, place, and time.  Psychiatric:        Mood and Affect: Mood normal.        Behavior: Behavior normal.    ED Results / Procedures / Treatments   Labs (all labs ordered are listed, but only abnormal results are displayed) Labs Reviewed - No data to display  EKG None  Radiology DG Forearm Left  Result Date: 08/18/2021 CLINICAL DATA:  Fall EXAM: LEFT HUMERUS - 2+ VIEW; LEFT FOREARM - 2 VIEW COMPARISON:  None. FINDINGS: There is no evidence of fracture or other focal bone lesions. Soft tissues are unremarkable. IMPRESSION: No acute osseous abnormality of the left humerus or left forearm. Electronically Signed   By: 14/06/2021 M.D.   On: 08/18/2021 14:46   DG Humerus Left  Result Date: 08/18/2021 CLINICAL DATA:  Fall EXAM: LEFT HUMERUS - 2+ VIEW; LEFT FOREARM - 2 VIEW COMPARISON:  None. FINDINGS: There is no evidence of fracture or other focal bone lesions. Soft tissues are unremarkable. IMPRESSION: No acute osseous abnormality of the left humerus or left forearm. Electronically Signed   By: 14/06/2021 M.D.   On: 08/18/2021 14:46    Procedures Procedures   Medications Ordered in ED Medications  oxyCODONE-acetaminophen (PERCOCET/ROXICET) 5-325 MG per tablet 2 tablet (2 tablets Oral Given 08/18/21 1540)    ED Course  I have reviewed the triage vital signs and the nursing notes.  Pertinent labs & imaging results that were available during my care  of the patient were reviewed by me and considered in my medical decision  making (see chart for details).    MDM Rules/Calculators/A&P                         Overall well-appearing patient with significant pain of the left arm after a fall 2 weeks ago.  Patient has had minimal relief with over-the-counter pain medication.  Radiographic imaging does not reveal any acute abnormality of the arm, evidence of arthritis, or other abnormality.  There is no evidence of dislocation.  I do not palpate any step-offs or deformity, do not see evidence of muscular tear, tendon rupture.  Patient with diffuse generalized pain of the entire arm, but otherwise neurovascularly intact, intact radial, ulnar pulses, no sensory deficits per patient, and intact strength.  Given the extent of her pain some concern for early progression to regional pain syndrome, encouraged follow-up with orthopedics for further evaluation.  We will prescribe muscle relaxant in addition to encouraging continued use of ibuprofen, Tylenol.  Will place arm in sling to reduce usage until patient can be further evaluated.  Encouraged to keep the arm elevated, ice the areas that are the most tender and follow-up if she has any sensory loss, redness, swelling, or worsening pain.  Patient discharged in stable condition, return precautions given. Final Clinical Impression(s) / ED Diagnoses Final diagnoses:  Left arm pain    Rx / DC Orders ED Discharge Orders          Ordered    methocarbamol (ROBAXIN) 500 MG tablet  2 times daily        08/18/21 1534             Olene Floss, PA-C 08/18/21 1622    Wynetta Fines, MD 08/18/21 2252

## 2021-10-01 NOTE — Progress Notes (Deleted)
10/02/2021 9:45 PM   Stephanie [name] [birth year] [id number]  Referring provider: Fayrene Helper, NP [address] [place],  [place] [address]  No chief complaint on file.  Urological history: 1. Nephrolithiasis -last stone procedure 03/2019 at [facility]   HPI: Stephanie [name] is a 56 y.o. female who presents today for abdominal and lower back pain.   UA***  KUB ****  PVR ****    PMH: Past Medical History:  Diagnosis Date   Diabetes mellitus without complication (HCC)    Difficult intubation    Diverticulitis    Headache    Hepatitis C    treated and resolved   History of kidney stones    Hypertension    IBS (irritable bowel syndrome)    Kidney stones    Sciatica    Sleep apnea    on cpap   Stroke [facility]) 2017   memory loss    Surgical History: Past Surgical History:  Procedure Laterality Date   ABDOMINAL HYSTERECTOMY     carpel tunnel syndrome  2017   HARDWARE REMOVAL Right 12/22/2017   Procedure: HARDWARE REMOVAL-RIGHT ARM;  Surgeon: Lyndle Herrlich, MD;  Location: [facility] ORS;  Service: Orthopedics;  Laterality: Right;  Removal of implant, superficial right humerus   HUMERUS IM NAIL Right 03/11/2017   Procedure: INTRAMEDULLARY (IM) NAIL HUMERAL;  Surgeon: Lyndle Herrlich, MD;  Location: [facility] ORS;  Service: Orthopedics;  Laterality: Right;   KIDNEY STONE SURGERY Right 02/12/12   KIDNEY STONE SURGERY Left 07/15/2012   LIGATION OF ARTERIOVENOUS  FISTULA Left 06/21/2016   Procedure: LIGATION OF ARTERIOVENOUS  FISTULA ( LIGATION BASILIC VEIN );  Surgeon: Renford Dills, MD;  Location: [facility] ORS;  Service: Vascular;  Laterality: Left;   LITHOTRIPSY     OOPHORECTOMY     SINUS EXPLORATION     TONSILLECTOMY      Home Medications:  Allergies as of 10/02/2021       Reactions   Gabapentin Itching   Ibuprofen Itching   Sulfa Antibiotics Hives   Tylenol [acetaminophen] Itching   Aspirin Itching   Buprenorphine Hcl Itching   Carbamazepine Itching, Other (See  Comments), Rash   Makes her feel like something is crawling under her skin.   Codeine Itching, Other (See Comments)   Makes her feel like something is crawling under her skin. Can take, sometimes makes her itch   Compazine Itching, Other (See Comments)   "makes my skin crawl"   Elemental Sulfur Hives, Rash, Other (See Comments)   Skin Rashes, Hives   Ioxaglate Itching   Ivp Dye [iodinated Contrast Media] Itching, Other (See Comments)   Makes her itch really bad. Had to get 2-3 shots of Benadryl when she was in the hospital.   Metrizamide Itching   Naproxen Itching   Norco [hydrocodone-acetaminophen] Itching   Other Itching, Other (See Comments)   Makes her itch really bad. Had to get 2-3 shots of Benadryl when she was in the hospital.   Penicillin G Hives, Rash, Other (See Comments)   Has patient had a PCN reaction causing immediate rash, facial/tongue/throat swelling, SOB or lightheadedness with hypotension: Yes Has patient had a PCN reaction causing severe rash involving mucus membranes or skin necrosis: No Has patient had a PCN reaction that required hospitalization: No Has patient had a PCN reaction occurring within the last 10 years: No If all of the above answers are "NO", then may proceed with Cephalosporin use.   Prochlorperazine Maleate Other (See  Comments)   Compazine makes her skin crawl - needs 2-3 shots of Benadryl to get relief.   Reglan [metoclopramide] Itching, Other (See Comments)   Makes her skin crawl   Toradol [ketorolac Tromethamine] Itching   Tramadol Itching   Zofran [ondansetron Hcl] Itching        Medication List        Accurate as of October 01, 2021  9:45 PM. If you have any questions, ask your nurse or doctor.          Ajovy 225 MG/1.5ML Soaj Generic drug: Fremanezumab-vfrm Inject 225 mg into the skin every 30 (thirty) days.   ARIPiprazole 15 MG tablet Commonly known as: ABILIFY Take 15 mg by mouth every morning.   budesonide-formoterol  160-4.5 MCG/ACT inhaler Commonly known as: SYMBICORT Inhale 2 puffs into the lungs 2 (two) times daily.   Caplyta 42 MG capsule Generic drug: lumateperone tosylate Take 42 mg by mouth every evening.   cetirizine 10 MG tablet Commonly known as: ZYRTEC Take 10 mg by mouth at bedtime as needed for allergies.   cholecalciferol 25 MCG (1000 UNIT) tablet Commonly known as: VITAMIN D3 Take 1,000 Units by mouth daily.   diazepam 10 MG tablet Commonly known as: VALIUM Take 10 mg by mouth every 8 (eight) hours as needed for anxiety.   furosemide 40 MG tablet Commonly known as: LASIX Take 40 mg by mouth daily as needed for edema.   glipiZIDE 5 MG tablet Commonly known as: GLUCOTROL Take 1 tablet (5 mg total) by mouth 2 (two) times daily before a meal.   Klor-Con M10 10 MEQ tablet Generic drug: potassium chloride Take 10 mEq by mouth daily as needed (when taking furosemide).   lamoTRIgine 100 MG tablet Commonly known as: LaMICtal Take 1 tablet (100 mg total) by mouth 2 (two) times daily.   levETIRAcetam 750 MG tablet Commonly known as: KEPPRA Take 1 tablet by mouth twice daily   losartan 100 MG tablet Commonly known as: COZAAR Take 100 mg by mouth daily.   methocarbamol 500 MG tablet Commonly known as: ROBAXIN Take 1 tablet (500 mg total) by mouth 2 (two) times daily.   metoprolol succinate 25 MG 24 hr tablet Commonly known as: TOPROL-XL Take 25 mg by mouth daily.   Nurtec 75 MG Tbdp Generic drug: Rimegepant Sulfate Take 1 tablet daily onset of migraine. What changed:  how much to take how to take this when to take this reasons to take this additional instructions   omeprazole 40 MG capsule Commonly known as: PRILOSEC Take 40 mg by mouth daily.   phentermine 37.5 MG tablet Commonly known as: ADIPEX-P Take 37.5 mg by mouth every morning.   prazosin 1 MG capsule Commonly known as: MINIPRESS Take 3 mg by mouth every evening.   pregabalin 150 MG  capsule Commonly known as: LYRICA Take 150 mg by mouth at bedtime.   traZODone 150 MG tablet Commonly known as: DESYREL Take 150 mg by mouth at bedtime as needed for sleep.   Trintellix 20 MG Tabs tablet Generic drug: vortioxetine HBr Take 20 mg by mouth daily.   VITAMIN B-12 PO Take 1 tablet by mouth daily.        Allergies:  Allergies  Allergen Reactions   Gabapentin Itching   Ibuprofen Itching   Sulfa Antibiotics Hives   Tylenol [Acetaminophen] Itching   Aspirin Itching   Buprenorphine Hcl Itching   Carbamazepine Itching, Other (See Comments) and Rash    Makes her feel like  something is crawling under her skin.   Codeine Itching and Other (See Comments)    Makes her feel like something is crawling under her skin. Can take, sometimes makes her itch   Compazine Itching and Other (See Comments)    "makes my skin crawl"   Elemental Sulfur Hives, Rash and Other (See Comments)    Skin Rashes, Hives   Ioxaglate Itching   Ivp Dye [Iodinated Contrast Media] Itching and Other (See Comments)    Makes her itch really bad. Had to get 2-3 shots of Benadryl when she was in the hospital.   Metrizamide Itching   Naproxen Itching   Norco [Hydrocodone-Acetaminophen] Itching   Other Itching and Other (See Comments)    Makes her itch really bad. Had to get 2-3 shots of Benadryl when she was in the hospital.   Penicillin G Hives, Rash and Other (See Comments)    Has patient had a PCN reaction causing immediate rash, facial/tongue/throat swelling, SOB or lightheadedness with hypotension: Yes Has patient had a PCN reaction causing severe rash involving mucus membranes or skin necrosis: No Has patient had a PCN reaction that required hospitalization: No Has patient had a PCN reaction occurring within the last 10 years: No If all of the above answers are "NO", then may proceed with Cephalosporin use.    Prochlorperazine Maleate Other (See Comments)    Compazine makes her skin crawl -  needs 2-3 shots of Benadryl to get relief.   Reglan [Metoclopramide] Itching and Other (See Comments)    Makes her skin crawl   Toradol [Ketorolac Tromethamine] Itching   Tramadol Itching   Zofran [Ondansetron Hcl] Itching    Family History: Family History  Problem Relation Age of Onset   Heart failure Mother    Diabetes Father    Cancer Sister     Social History:  reports that she has been smoking cigarettes. She has a 1.50 pack-year smoking history. She has never used smokeless tobacco. She reports that she does not currently use alcohol. She reports that she does not use drugs.  ROS: Pertinent ROS in HPI  Physical Exam: There were no vitals taken for this visit.  Constitutional:  Well nourished. Alert and oriented, No acute distress. HEENT: [place] AT, moist mucus membranes.  Trachea midline, no masses. Cardiovascular: No clubbing, cyanosis, or edema. Respiratory: Normal respiratory effort, no increased work of breathing. GI: Abdomen is soft, non tender, non distended, no abdominal masses. Liver and spleen not palpable.  No hernias appreciated.  Stool sample for occult testing is not indicated.   GU: No CVA tenderness.  No bladder fullness or masses.  *** external genitalia, *** pubic hair distribution, no lesions.  Normal urethral meatus, no lesions, no prolapse, no discharge.   No urethral masses, tenderness and/or tenderness. No bladder fullness, tenderness or masses. *** vagina mucosa, *** estrogen effect, no discharge, no lesions, *** pelvic support, *** cystocele and *** rectocele noted.  No cervical motion tenderness.  Uterus is freely mobile and non-fixed.  No adnexal/parametria masses or tenderness noted.  Anus and perineum are without rashes or lesions.   ***  Skin: No rashes, bruises or suspicious lesions. Lymph: No cervical or inguinal adenopathy. Neurologic: Grossly intact, no focal deficits, moving all 4 extremities. Psychiatric: Normal mood and affect.    Laboratory  Data: Lab Results  Component Value Date   WBC 6.8 07/16/2021   HGB 13.8 07/16/2021   HCT 42.2 07/16/2021   MCV 95.3 07/16/2021   PLT 162  07/16/2021    Lab Results  Component Value Date   CREATININE 0.86 07/16/2021    Lab Results  Component Value Date   TSH 1.443 07/16/2021    Lab Results  Component Value Date   AST 25 07/16/2021   Lab Results  Component Value Date   ALT 23 07/16/2021    Urinalysis ***   I have reviewed the labs.   Pertinent Imaging: *** I have independently reviewed the films.  See HPI.   Assessment & Plan:  ***  1. Nephrolithiasis ***  No follow-ups on file.  These notes generated with voice recognition software. I apologize for typographical errors.  Zara Council, PA-C  [facility] [address]  [place], [place] [address] [phone]

## 2021-10-02 ENCOUNTER — Ambulatory Visit: Payer: Medicaid Other | Admitting: Urology

## 2021-10-02 DIAGNOSIS — N200 Calculus of kidney: Secondary | ICD-10-CM

## 2021-10-31 ENCOUNTER — Emergency Department (HOSPITAL_COMMUNITY): Payer: Medicaid Other

## 2021-10-31 ENCOUNTER — Emergency Department (HOSPITAL_COMMUNITY)
Admission: EM | Admit: 2021-10-31 | Discharge: 2021-10-31 | Disposition: A | Discharge status: Home / Self Care | Payer: Medicaid Other | Attending: Emergency Medicine | Admitting: Emergency Medicine

## 2021-10-31 DIAGNOSIS — G8929 Other chronic pain: Secondary | ICD-10-CM | POA: Insufficient documentation

## 2021-10-31 DIAGNOSIS — M5416 Radiculopathy, lumbar region: Secondary | ICD-10-CM | POA: Insufficient documentation

## 2021-10-31 DIAGNOSIS — N3000 Acute cystitis without hematuria: Secondary | ICD-10-CM | POA: Insufficient documentation

## 2021-10-31 DIAGNOSIS — N200 Calculus of kidney: Secondary | ICD-10-CM | POA: Insufficient documentation

## 2021-10-31 DIAGNOSIS — M25512 Pain in left shoulder: Secondary | ICD-10-CM | POA: Diagnosis not present

## 2021-10-31 DIAGNOSIS — R519 Headache, unspecified: Secondary | ICD-10-CM | POA: Diagnosis not present

## 2021-10-31 LAB — COMPREHENSIVE METABOLIC PANEL
ALT: 35 U/L (ref 0–44)
AST: 27 U/L (ref 15–41)
Albumin: 4 g/dL (ref 3.5–5.0)
Alkaline Phosphatase: 99 U/L (ref 38–126)
Anion gap: 9 (ref 5–15)
BUN: 19 mg/dL (ref 6–20)
CO2: 27 mmol/L (ref 22–32)
Calcium: 9.8 mg/dL (ref 8.9–10.3)
Chloride: 101 mmol/L (ref 98–111)
Creatinine, Ser: 0.79 mg/dL (ref 0.44–1.00)
GFR, Estimated: >60 mL/min (ref >60)
Glucose, Bld: 138 mg/dL — ABNORMAL HIGH (ref 70–99)
Potassium: 3.9 mmol/L (ref 3.5–5.1)
Sodium: 137 mmol/L (ref 135–145)
Total Bilirubin: 0.4 mg/dL (ref 0.3–1.2)
Total Protein: 7.6 g/dL (ref 6.5–8.1)

## 2021-10-31 LAB — URINALYSIS, ROUTINE W REFLEX MICROSCOPIC
Bilirubin Urine: NEGATIVE
Glucose, UA: NEGATIVE mg/dL
Hgb urine dipstick: NEGATIVE
Ketones, ur: NEGATIVE mg/dL
Nitrite: NEGATIVE
Protein, ur: 30 mg/dL — AB
Specific Gravity, Urine: 1.016 (ref 1.005–1.030)
WBC, UA: >50 WBC/hpf — ABNORMAL HIGH (ref 0–5)
pH: 5 (ref 5.0–8.0)

## 2021-10-31 LAB — CBC WITH DIFFERENTIAL/PLATELET
Abs Immature Granulocytes: 0.03 10*3/uL (ref 0.00–0.07)
Basophils Absolute: 0 10*3/uL (ref 0.0–0.1)
Basophils Relative: 0 %
Eosinophils Absolute: 0.1 10*3/uL (ref 0.0–0.5)
Eosinophils Relative: 1 %
HCT: 42.6 % (ref 36.0–46.0)
Hemoglobin: 13.8 g/dL (ref 12.0–15.0)
Immature Granulocytes: 0 %
Lymphocytes Relative: 31 %
Lymphs Abs: 2.5 10*3/uL (ref 0.7–4.0)
MCH: 31.1 pg (ref 26.0–34.0)
MCHC: 32.4 g/dL (ref 30.0–36.0)
MCV: 95.9 fL (ref 80.0–100.0)
Monocytes Absolute: 0.8 10*3/uL (ref 0.1–1.0)
Monocytes Relative: 10 %
Neutro Abs: 4.7 10*3/uL (ref 1.7–7.7)
Neutrophils Relative %: 58 %
Platelets: 199 10*3/uL (ref 150–400)
RBC: 4.44 MIL/uL (ref 3.87–5.11)
RDW: 13.1 % (ref 11.5–15.5)
WBC: 8.2 10*3/uL (ref 4.0–10.5)
nRBC: 0 % (ref 0.0–0.2)

## 2021-10-31 MED ORDER — LORAZEPAM 2 MG/ML IJ SOLN
1.0000 mg | Freq: Once | INTRAMUSCULAR | Status: AC
  Administered 2021-10-31: 1 mg via INTRAVENOUS
  Filled 2021-10-31: qty 1

## 2021-10-31 MED ORDER — SODIUM CHLORIDE 0.9 % IV SOLN
1.0000 g | Freq: Once | INTRAVENOUS | Status: AC
  Administered 2021-10-31: 1 g via INTRAVENOUS
  Filled 2021-10-31: qty 10

## 2021-10-31 MED ORDER — MORPHINE SULFATE (PF) 4 MG/ML IV SOLN
4.0000 mg | Freq: Once | INTRAVENOUS | Status: AC
  Administered 2021-10-31: 4 mg via INTRAVENOUS
  Filled 2021-10-31: qty 1

## 2021-10-31 MED ORDER — HYDROMORPHONE HCL 1 MG/ML IJ SOLN
1.0000 mg | Freq: Once | INTRAMUSCULAR | Status: AC | PRN
Start: 2021-10-31 — End: 2021-10-31
  Administered 2021-10-31: 1 mg via INTRAVENOUS
  Filled 2021-10-31: qty 1

## 2021-10-31 MED ORDER — SODIUM CHLORIDE 0.9 % IV BOLUS
1000.0000 mL | Freq: Once | INTRAVENOUS | Status: AC
  Administered 2021-10-31: 1000 mL via INTRAVENOUS

## 2021-10-31 MED ORDER — CEPHALEXIN 500 MG PO CAPS: 500.0000 mg | ORAL_CAPSULE | Freq: Two times a day (BID) | ORAL | 0 refills | Status: DC

## 2021-10-31 MED ORDER — ONDANSETRON HCL 4 MG/2ML IJ SOLN
4.0000 mg | Freq: Once | INTRAMUSCULAR | Status: AC
  Administered 2021-10-31: 4 mg via INTRAVENOUS
  Filled 2021-10-31: qty 2

## 2021-10-31 MED ORDER — OXYCODONE-ACETAMINOPHEN 5-325 MG PO TABS: 1.0000 | ORAL_TABLET | Freq: Four times a day (QID) | ORAL | 0 refills | Status: DC | PRN

## 2021-10-31 NOTE — ED Provider Notes (Signed)
[place] DEPT Provider Note   CSN: [id number] Arrival date & time: 10/31/21  1618     History  Chief Complaint  Patient presents with   Flank Pain   Headache   Shoulder Pain    Stephanie [name] is a 56 y.o. female hx of kidney stone, chronic migraines, chronic back pain, here presenting with flank pain and headache and shoulder pain.  Patient states that she has been having pain all over.  She states that she has left shoulder pain for the last several weeks.  Patient has been followed up with [facility] multiple times and has been put in a shoulder immobilizer with no relief.  Patient also had progressive worsening back pain.  Patient had injection by her spine doctor about a week ago and the pain has gotten worse.  Patient also complains of headaches as well.  Patient has a history of migraines and took her medicines for migraines with no relief.  The history is provided by the patient.      Home Medications Prior to Admission medications   Medication Sig Start Date End Date Taking? Authorizing Provider  ARIPiprazole (ABILIFY) 15 MG tablet Take 15 mg by mouth every morning. 06/10/21   [provider]  budesonide-formoterol (SYMBICORT) 160-4.5 MCG/ACT inhaler Inhale 2 puffs into the lungs 2 (two) times daily.    [provider]  CAPLYTA 42 MG capsule Take 42 mg by mouth every evening. 06/08/21   [provider]  cetirizine (ZYRTEC) 10 MG tablet Take 10 mg by mouth at bedtime as needed for allergies. 01/07/21   [provider]  cholecalciferol (VITAMIN D3) 25 MCG (1000 UT) tablet Take 1,000 Units by mouth daily.    [provider]  Cyanocobalamin (VITAMIN B-12 PO) Take 1 tablet by mouth daily.    [provider]  diazepam (VALIUM) 10 MG tablet Take 10 mg by mouth every 8 (eight) hours as needed for anxiety. 01/23/21   [provider]  Fremanezumab-vfrm (AJOVY) 225 MG/1.5ML SOAJ Inject  225 mg into the skin every 30 (thirty) days. 10/17/20   Ward Givens, NP  furosemide (LASIX) 40 MG tablet Take 40 mg by mouth daily as needed for edema. 01/23/21   [provider]  glipiZIDE (GLUCOTROL) 5 MG tablet Take 1 tablet (5 mg total) by mouth 2 (two) times daily before a meal. 04/30/16   Milagros Loll, MD  KLOR-CON M10 10 MEQ tablet Take 10 mEq by mouth daily as needed (when taking furosemide). 01/23/21   [provider]  lamoTRIgine (LAMICTAL) 100 MG tablet Take 1 tablet (100 mg total) by mouth 2 (two) times daily. 06/20/20   Kathrynn Ducking, MD  levETIRAcetam (KEPPRA) 750 MG tablet Take 1 tablet by mouth twice daily Patient taking differently: Take 750 mg by mouth 2 (two) times daily. 01/08/21   Kathrynn Ducking, MD  losartan (COZAAR) 100 MG tablet Take 100 mg by mouth daily. 01/23/21   [provider]  methocarbamol (ROBAXIN) 500 MG tablet Take 1 tablet (500 mg total) by mouth 2 (two) times daily. 08/18/21   Prosperi, Christian H, PA-C  metoprolol succinate (TOPROL-XL) 25 MG 24 hr tablet Take 25 mg by mouth daily. 11/12/20   [provider]  omeprazole (PRILOSEC) 40 MG capsule Take 40 mg by mouth daily. 08/13/19   [provider]  phentermine (ADIPEX-P) 37.5 MG tablet Take 37.5 mg by mouth every morning. 01/23/21   [provider]  prazosin (MINIPRESS)  1 MG capsule Take 3 mg by mouth every evening. 02/15/21   [provider]  pregabalin (LYRICA) 150 MG capsule Take 150 mg by mouth at bedtime. 12/05/20   [provider]  Rimegepant Sulfate (NURTEC) 75 MG TBDP Take 1 tablet daily onset of migraine. Patient taking differently: Take 75 mg by mouth daily as needed (onset of migraine). 07/20/20   Butch Penny, NP  traZODone (DESYREL) 150 MG tablet Take 150 mg by mouth at bedtime as needed for sleep.  05/30/20   [provider]  TRINTELLIX 20 MG TABS tablet Take 20 mg by mouth daily. 01/22/21   [provider]       Allergies    Gabapentin, Ibuprofen, Sulfa antibiotics, Tylenol [acetaminophen], Aspirin, Buprenorphine hcl, Carbamazepine, Codeine, Compazine, Elemental sulfur, Ioxaglate, Ivp dye [iodinated contrast media], Metrizamide, Naproxen, Norco [hydrocodone-acetaminophen], Other, Penicillin g, Prochlorperazine maleate, Reglan [metoclopramide], Toradol [ketorolac tromethamine], Tramadol, and Zofran [ondansetron hcl]    Review of Systems   Review of Systems  Genitourinary:  Positive for flank pain.  Neurological:  Positive for headaches.  All other systems reviewed and are negative.  Physical Exam Updated Vital Signs BP 128/85 (BP Location: Right Arm)    Pulse 85    Temp (!) 97.5 F (36.4 C) (Oral)    Resp 18    Ht 5\' 2"  (1.575 m)    Wt 88 kg    SpO2 97%    BMI 35.48 kg/m  Physical Exam Vitals and nursing note reviewed.  Constitutional:      Comments: Uncomfortable, crying in pain  HENT:     Head: Normocephalic.  Eyes:     Extraocular Movements: Extraocular movements intact.     Pupils: Pupils are equal, round, and reactive to light.  Cardiovascular:     Rate and Rhythm: Normal rate and regular rhythm.  Pulmonary:     Effort: Pulmonary effort is normal.     Breath sounds: Normal breath sounds.  Abdominal:     General: Bowel sounds are normal.     Palpations: Abdomen is soft.     Comments: + Left CVA tenderness  Musculoskeletal:        General: Normal range of motion.     Cervical back: Normal range of motion and neck supple.     Comments: Patient has lower lumbar tenderness.  Patient does not have any obvious redness to the injection site.  Skin:    General: Skin is warm.  Neurological:     Mental Status: She is alert and oriented to person, place, and time.     Comments: It is very difficult to get a complete neurologic exam on patient.  Patient has left shoulder immobilizer and has been having left shoulder pain still unable to range the left upper extremity that much.   Patient also is in severe pain and difficult for her to move both legs.  However there is no saddle anesthesia.  Patient has normal reflexes bilateral knees  Psychiatric:        Mood and Affect: Mood normal.        Behavior: Behavior normal.    ED Results / Procedures / Treatments   Labs (all labs ordered are listed, but only abnormal results are displayed) Labs Reviewed  CBC WITH DIFFERENTIAL/PLATELET  COMPREHENSIVE METABOLIC PANEL  URINALYSIS, ROUTINE W REFLEX MICROSCOPIC    EKG None  Radiology No results found.  Procedures Procedures    Medications Ordered in ED Medications  morphine (PF) 4 MG/ML injection 4  mg (has no administration in time range)  ondansetron (ZOFRAN) injection 4 mg (has no administration in time range)  LORazepam (ATIVAN) injection 1 mg (has no administration in time range)    ED Course/ Medical Decision Making/ A&P                           Medical Decision Making Stephanie [name] is a 56 y.o. female here presenting with back pain and flank pain and headache and shoulder pain.  Patient has been having shoulder pain for the last 2 months.  Patient has worsening back pain and headaches.  Patient requesting pain medicine.  I am concerned for possible epidural abscess versus lumbar radiculopathy.  Also concern for worsening migraines versus head bleed.  The shoulder pain has been going on for several months and I think likely rotator cuff tear versus frozen shoulder.  We will get an x-ray as well.  We will also get CT renal stone to rule out kidney stones.  Will give pain medicine and reassess.  9:32 PM Patient's UA showed UTI.  CT renal stone showed nonobstructing renal calculi.  MRI showed disc bulging but no obvious spinal stenosis.  Patient's pain under control after pain medicine.  However she desatted briefly and her blood pressure dropped to the 80s.  Patient given 2 L normal appearing bolus and blood pressure up to the upper 90s.  Husband is now at  bedside.  He states that this is baseline for her.  Patient is more awake now and tolerated p.o.  At this point, patient will be discharged home with antibiotics.  She is on Valium at baseline for anxiety.  I told her I can only give several pills of Percocet for pain.  I told her that she will need to follow-up with her back doctor and orthopedic doctor.  Problems Addressed: Acute cystitis without hematuria: acute illness or injury Chronic left shoulder pain: acute illness or injury Lumbar radiculopathy: acute illness or injury  Amount and/or Complexity of Data Reviewed Labs: ordered. Decision-making details documented in ED Course. Radiology: ordered and independent interpretation performed. Decision-making details documented in ED Course.  Risk Prescription drug management.  Final Clinical Impression(s) / ED Diagnoses Final diagnoses:  None    Rx / DC Orders ED Discharge Orders     None         Drenda Freeze, MD 10/31/21 2137

## 2021-10-31 NOTE — ED Triage Notes (Signed)
Pt states she has had a headache for the past 3 days. Pt also complains of right sided flank for the past 4 to 5 days. Pt reports left shoulder pain for almost 2 months.

## 2021-10-31 NOTE — Discharge Instructions (Addendum)
You have a urinary tract infection.  Please take Keflex 3 times daily for a week.  I prescribed Percocet as needed for pain.  Please do not take it with Valium  You need to follow-up with your orthopedic doctor and also your spine doctor at [facility].  Return to ER if you have worse back pain, shoulder pain, trouble walking, flank pain, fever, vomiting

## 2021-10-31 NOTE — ED Notes (Signed)
Post dilaudid admin pt sys dropped to high 80s and SPO2 into 80s. 4L [place] applied to restore SPO2 levels. MD notified with orders placed.

## 2021-10-31 NOTE — ED Notes (Signed)
Broome removed, and SPO2 levels are stable in mid/high 90s.

## 2021-10-31 NOTE — ED Notes (Signed)
Pt given cranberry juice. Tolerated fluids.

## 2021-11-09 ENCOUNTER — Ambulatory Visit: Payer: Medicaid Other | Admitting: Urology

## 2021-11-09 ENCOUNTER — Encounter: Payer: Self-pay | Admitting: Urology

## 2021-12-17 DIAGNOSIS — M5441 Lumbago with sciatica, right side: Secondary | ICD-10-CM | POA: Diagnosis not present

## 2021-12-17 DIAGNOSIS — G8929 Other chronic pain: Secondary | ICD-10-CM | POA: Diagnosis not present

## 2021-12-17 DIAGNOSIS — M479 Spondylosis, unspecified: Secondary | ICD-10-CM | POA: Diagnosis not present

## 2021-12-31 ENCOUNTER — Encounter: Payer: Self-pay | Admitting: Family

## 2022-01-06 NOTE — Progress Notes (Signed)
  This encounter was created in error - please disregard. No show 

## 2022-01-07 DIAGNOSIS — M479 Spondylosis, unspecified: Secondary | ICD-10-CM | POA: Diagnosis not present

## 2022-01-25 ENCOUNTER — Ambulatory Visit: Payer: Medicaid Other | Admitting: Nurse Practitioner

## 2022-01-25 DIAGNOSIS — R2689 Other abnormalities of gait and mobility: Secondary | ICD-10-CM | POA: Diagnosis not present

## 2022-01-25 DIAGNOSIS — Z8673 Personal history of transient ischemic attack (TIA), and cerebral infarction without residual deficits: Secondary | ICD-10-CM | POA: Diagnosis not present

## 2022-01-25 DIAGNOSIS — I69398 Other sequelae of cerebral infarction: Secondary | ICD-10-CM | POA: Diagnosis not present

## 2022-01-25 DIAGNOSIS — Z79899 Other long term (current) drug therapy: Secondary | ICD-10-CM | POA: Diagnosis not present

## 2022-01-25 DIAGNOSIS — G4733 Obstructive sleep apnea (adult) (pediatric): Secondary | ICD-10-CM | POA: Diagnosis not present

## 2022-01-25 DIAGNOSIS — Z8669 Personal history of other diseases of the nervous system and sense organs: Secondary | ICD-10-CM | POA: Diagnosis not present

## 2022-01-25 DIAGNOSIS — R569 Unspecified convulsions: Secondary | ICD-10-CM | POA: Diagnosis not present

## 2022-01-25 DIAGNOSIS — R404 Transient alteration of awareness: Secondary | ICD-10-CM | POA: Diagnosis not present

## 2022-01-25 DIAGNOSIS — E538 Deficiency of other specified B group vitamins: Secondary | ICD-10-CM | POA: Diagnosis not present

## 2022-01-25 DIAGNOSIS — R296 Repeated falls: Secondary | ICD-10-CM | POA: Diagnosis not present

## 2022-01-28 DIAGNOSIS — F411 Generalized anxiety disorder: Secondary | ICD-10-CM | POA: Diagnosis not present

## 2022-01-28 DIAGNOSIS — F4312 Post-traumatic stress disorder, chronic: Secondary | ICD-10-CM | POA: Diagnosis not present

## 2022-01-28 DIAGNOSIS — F319 Bipolar disorder, unspecified: Secondary | ICD-10-CM | POA: Diagnosis not present

## 2022-01-28 DIAGNOSIS — R69 Illness, unspecified: Secondary | ICD-10-CM | POA: Diagnosis not present

## 2022-01-28 DIAGNOSIS — F410 Panic disorder [episodic paroxysmal anxiety] without agoraphobia: Secondary | ICD-10-CM | POA: Diagnosis not present

## 2022-01-29 ENCOUNTER — Encounter (HOSPITAL_COMMUNITY): Payer: Self-pay | Admitting: Pharmacy Technician

## 2022-01-29 ENCOUNTER — Other Ambulatory Visit: Payer: Self-pay

## 2022-01-29 ENCOUNTER — Emergency Department (HOSPITAL_COMMUNITY)
Admission: EM | Admit: 2022-01-29 | Discharge: 2022-01-29 | Disposition: A | Discharge status: Home / Self Care | Payer: Medicaid Other | Attending: Emergency Medicine | Admitting: Emergency Medicine

## 2022-01-29 ENCOUNTER — Emergency Department (HOSPITAL_COMMUNITY): Payer: Medicaid Other

## 2022-01-29 DIAGNOSIS — G9389 Other specified disorders of brain: Secondary | ICD-10-CM | POA: Diagnosis not present

## 2022-01-29 DIAGNOSIS — H532 Diplopia: Secondary | ICD-10-CM | POA: Diagnosis not present

## 2022-01-29 DIAGNOSIS — Z9889 Other specified postprocedural states: Secondary | ICD-10-CM | POA: Diagnosis not present

## 2022-01-29 DIAGNOSIS — M545 Low back pain, unspecified: Secondary | ICD-10-CM | POA: Diagnosis not present

## 2022-01-29 DIAGNOSIS — W010XXA Fall on same level from slipping, tripping and stumbling without subsequent striking against object, initial encounter: Secondary | ICD-10-CM | POA: Insufficient documentation

## 2022-01-29 DIAGNOSIS — R4182 Altered mental status, unspecified: Secondary | ICD-10-CM | POA: Diagnosis not present

## 2022-01-29 DIAGNOSIS — R1030 Lower abdominal pain, unspecified: Secondary | ICD-10-CM | POA: Diagnosis not present

## 2022-01-29 DIAGNOSIS — W19XXXA Unspecified fall, initial encounter: Secondary | ICD-10-CM

## 2022-01-29 DIAGNOSIS — R519 Headache, unspecified: Secondary | ICD-10-CM | POA: Insufficient documentation

## 2022-01-29 DIAGNOSIS — S0993XA Unspecified injury of face, initial encounter: Secondary | ICD-10-CM | POA: Diagnosis not present

## 2022-01-29 DIAGNOSIS — J3489 Other specified disorders of nose and nasal sinuses: Secondary | ICD-10-CM | POA: Diagnosis not present

## 2022-01-29 MED ORDER — OXYCODONE HCL 5 MG PO TABS
5.0000 mg | ORAL_TABLET | Freq: Once | ORAL | Status: AC
  Administered 2022-01-29: 5 mg via ORAL
  Filled 2022-01-29: qty 1

## 2022-01-29 MED ORDER — OXYCODONE HCL 5 MG PO TABS
2.5000 mg | ORAL_TABLET | Freq: Once | ORAL | Status: AC
Start: 2022-01-29 — End: 2022-01-29
  Administered 2022-01-29: 2.5 mg via ORAL
  Filled 2022-01-29: qty 1

## 2022-01-29 MED ORDER — KETOROLAC TROMETHAMINE 15 MG/ML IJ SOLN
15.0000 mg | Freq: Once | INTRAMUSCULAR | Status: DC
  Filled 2022-01-29: qty 1

## 2022-01-29 NOTE — ED Notes (Signed)
Patient transported to CT 

## 2022-01-29 NOTE — ED Triage Notes (Signed)
Pt here with pain to L side of head after falling yesterday when going into her house. Denies LOC. Pt also complains of ongoing R flank pain from kidney stones dx 4 months ago.

## 2022-01-29 NOTE — ED Provider Notes (Signed)
[place] DEPT Provider Note   CSN: [id number] Arrival date & time: 01/29/22  1030     History  Chief Complaint  Patient presents with   Fall   Flank Pain    Stephanie [name] is a 56 y.o. female.  72 yo F with a chief complaints of a fall.  Looking at the patient's records she has a history of frequent falls.  She tells me that she thinks she got tripped up by her dog and fell.  This occurred yesterday.  She struck the left side of her face.  She denies confusion denies vomiting.  She does feel like she is seeing double on that side now which is new for her.  She says it changes when she closes her left eye.  Has never noticed that before.  She denies any neck pain chest pain abdominal pain.  She has been having chronic back pain for at least 4 months or so.  She is seeing a orthopedic doctor who is helping take care of this for her.  She denies any significant change to her back pain but just wanted to let us know that she was having back pain.  She denies loss of bowel or bladder denies loss of sensation denies numbness or weakness legs   Fall  Flank Pain      Home Medications Prior to Admission medications   Medication Sig Start Date End Date Taking? Authorizing Provider  ARIPiprazole (ABILIFY) 15 MG tablet Take 15 mg by mouth every morning. 06/10/21   [provider]  budesonide-formoterol (SYMBICORT) 160-4.5 MCG/ACT inhaler Inhale 2 puffs into the lungs 2 (two) times daily.    [provider]  CAPLYTA 42 MG capsule Take 42 mg by mouth every evening. 06/08/21   [provider]  cephALEXin (KEFLEX) 500 MG capsule Take 1 capsule (500 mg total) by mouth 2 (two) times daily. 10/31/21   Drenda Freeze, MD  cetirizine (ZYRTEC) 10 MG tablet Take 10 mg by mouth at bedtime as needed for allergies. 01/07/21   [provider]  cholecalciferol (VITAMIN D3) 25 MCG (1000 UT) tablet Take 1,000 Units by mouth daily.     [provider]  Cyanocobalamin (VITAMIN B-12 PO) Take 1 tablet by mouth daily.    [provider]  diazepam (VALIUM) 10 MG tablet Take 10 mg by mouth every 8 (eight) hours as needed for anxiety. 01/23/21   [provider]  Fremanezumab-vfrm (AJOVY) 225 MG/1.5ML SOAJ Inject 225 mg into the skin every 30 (thirty) days. 10/17/20   Ward Givens, NP  furosemide (LASIX) 40 MG tablet Take 40 mg by mouth daily as needed for edema. 01/23/21   [provider]  glipiZIDE (GLUCOTROL) 5 MG tablet Take 1 tablet (5 mg total) by mouth 2 (two) times daily before a meal. 04/30/16   Milagros Loll, MD  KLOR-CON M10 10 MEQ tablet Take 10 mEq by mouth daily as needed (when taking furosemide). 01/23/21   [provider]  lamoTRIgine (LAMICTAL) 100 MG tablet Take 1 tablet (100 mg total) by mouth 2 (two) times daily. 06/20/20   Kathrynn Ducking, MD  levETIRAcetam (KEPPRA) 750 MG tablet Take 1 tablet by mouth twice daily Patient taking differently: Take 750 mg by mouth 2 (two) times daily. 01/08/21   Kathrynn Ducking, MD  losartan (COZAAR) 100 MG tablet Take 100 mg by mouth daily. 01/23/21   [provider]  methocarbamol (ROBAXIN) 500 MG tablet Take 1  tablet (500 mg total) by mouth 2 (two) times daily. 08/18/21   Prosperi, Christian H, PA-C  metoprolol succinate (TOPROL-XL) 25 MG 24 hr tablet Take 25 mg by mouth daily. 11/12/20   [provider]  omeprazole (PRILOSEC) 40 MG capsule Take 40 mg by mouth daily. 08/13/19   [provider]  oxyCODONE-acetaminophen (PERCOCET) 5-325 MG tablet Take 1 tablet by mouth every 6 (six) hours as needed. 10/31/21   Drenda Freeze, MD  phentermine (ADIPEX-P) 37.5 MG tablet Take 37.5 mg by mouth every morning. 01/23/21   [provider]  prazosin (MINIPRESS) 1 MG capsule Take 3 mg by mouth every evening. 02/15/21   [provider]  pregabalin (LYRICA) 150 MG capsule Take 150 mg by mouth at bedtime.  12/05/20   [provider]  Rimegepant Sulfate (NURTEC) 75 MG TBDP Take 1 tablet daily onset of migraine. Patient taking differently: Take 75 mg by mouth daily as needed (onset of migraine). 07/20/20   Ward Givens, NP  traZODone (DESYREL) 150 MG tablet Take 150 mg by mouth at bedtime as needed for sleep.  05/30/20   [provider]  TRINTELLIX 20 MG TABS tablet Take 20 mg by mouth daily. 01/22/21   [provider]      Allergies    Gabapentin, Ibuprofen, Sulfa antibiotics, Tylenol [acetaminophen], Aspirin, Buprenorphine hcl, Carbamazepine, Codeine, Compazine, Elemental sulfur, Ioxaglate, Ivp dye [iodinated contrast media], Metrizamide, Naproxen, Norco [hydrocodone-acetaminophen], Other, Penicillin g, Prochlorperazine maleate, Reglan [metoclopramide], Toradol [ketorolac tromethamine], Tramadol, and Zofran [ondansetron hcl]    Review of Systems   Review of Systems  Genitourinary:  Positive for flank pain.   Physical Exam Updated Vital Signs BP (!) 142/78   Pulse 60   Temp 97.6 F (36.4 C) (Oral)   Resp 18   Ht 5\' 2"  (1.575 m)   Wt 96.2 kg   SpO2 97%   BMI 38.78 kg/m  Physical Exam Vitals and nursing note reviewed.  Constitutional:      General: She is not in acute distress.    Appearance: She is well-developed. She is not diaphoretic.  HENT:     Head: Normocephalic.     Comments: Faint bruising to the left zygomatic arch Eyes:     Pupils: Pupils are equal, round, and reactive to light.  Cardiovascular:     Rate and Rhythm: Normal rate and regular rhythm.     Heart sounds: No murmur heard.   No friction rub. No gallop.  Pulmonary:     Effort: Pulmonary effort is normal.     Breath sounds: No wheezing or rales.  Abdominal:     General: There is no distension.     Palpations: Abdomen is soft.     Tenderness: There is no abdominal tenderness.  Musculoskeletal:        General: No tenderness.     Cervical back: Normal range of motion and neck  supple.     Comments: Pain mostly to the right paraspinal musculature along the L-spine.  No rash.  Pulse motor and sensation intact distally.  Reflexes are 2+ and equal.  No clonus.  Skin:    General: Skin is warm and dry.  Neurological:     Mental Status: She is alert and oriented to person, place, and time.     Cranial Nerves: Cranial nerves 2-12 are intact.     Sensory: Sensation is intact.     Motor: Motor function is intact.     Coordination: Coordination is intact.  Comments: Benign neurologic exam.  Extraocular motion intact.  Psychiatric:        Behavior: Behavior normal.    ED Results / Procedures / Treatments   Labs (all labs ordered are listed, but only abnormal results are displayed) Labs Reviewed - No data to display  EKG None  Radiology CT Head Wo Contrast  Result Date: 01/29/2022 CLINICAL DATA:  Head trauma. Abnormal mental status. Left-sided head pain after falling yesterday. EXAM: CT HEAD WITHOUT CONTRAST TECHNIQUE: Contiguous axial images were obtained from the base of the skull through the vertex without intravenous contrast. RADIATION DOSE REDUCTION: This exam was performed according to the departmental dose-optimization program which includes automated exposure control, adjustment of the mA and/or kV according to patient size and/or use of iterative reconstruction technique. COMPARISON:  CT brain 10/31/2021; MRI brain 07/16/2021 FINDINGS: Brain: There is mild cortical atrophy, unchanged from prior and within normal limits for patient age. The ventricles are normal in configuration. The basilar cisterns are patent. No significant change in left temporal-occipital volume loss and encephalomalacia from remote infarct. No acute intracranial hemorrhage is seen. No abnormal extra-axial fluid collection. Preservation of the normal cortical gray-white interface without CT evidence of an acute major vascular territorial cortical based infarction. Vascular: No hyperdense  vessel or unexpected calcification. Skull: Normal. Negative for fracture or focal lesion. Sinuses/Orbits: The visualized orbits are unremarkable. Redemonstration of prior right maxillary antrostomy. Mild peripheral mucosal thickening throughout the ethmoid air cells overall with similar sinus opacification compared to prior. Mild left sphenoid sinus peripheral mucosal thickening is new from prior. The visualized mastoid air cells are clear. Other: None. IMPRESSION: 1. No significant change compared to 10/31/2021. 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 3. No acute intracranial process. Electronically Signed   By: Yvonne Kendall M.D.   On: 01/29/2022 11:47   CT Maxillofacial Wo Contrast  Result Date: 01/29/2022 CLINICAL DATA:  Blunt facial trauma. Left-sided head pain after falling yesterday. EXAM: CT MAXILLOFACIAL WITHOUT CONTRAST TECHNIQUE: Multidetector CT imaging of the maxillofacial structures was performed. Multiplanar CT image reconstructions were also generated. RADIATION DOSE REDUCTION: This exam was performed according to the departmental dose-optimization program which includes automated exposure control, adjustment of the mA and/or kV according to patient size and/or use of iterative reconstruction technique. COMPARISON:  None Available. FINDINGS: Osseous: The nasal bone, zygomatic arches, and pterygoid plates are intact. The bilateral temporomandibular joints are intact. The superior, inferior, medial, and lateral aspect of the each bony orbit are intact. The atlantodens interval is intact. Orbits: The orbital globes are intact. The extraocular muscles are intact. Sinuses: The frontal and right maxillary sinuses are well aerated. Mild peripheral mucosal thickening of the ethmoid air cells diffusely. Mild peripheral mucosal thickening of the left sphenoid sinus and inferior left maxillary sinus. No air-fluid levels. Postsurgical changes of prior right maxillary antrostomy. Soft tissues: Mild induration  of the superior left maxillary malar/cheek soft tissues. No significant hematoma. Moderate atherosclerotic calcifications within the bilateral carotid bulbs. Limited intracranial: Partial visualization of old encephalomalacia within the left temporal-lateral occipital lobe, as on 10/31/2021 head CT. IMPRESSION: 1. No acute fracture. 2. Mild paranasal sinus mucosal thickening.  No air-fluid levels. 3. Electronically Signed   By: Yvonne Kendall M.D.   On: 01/29/2022 12:44    Procedures Procedures    Medications Ordered in ED Medications  ketorolac (TORADOL) 15 MG/ML injection 15 mg ( Intramuscular Patient Refused/Not Given 01/29/22 1101)  oxyCODONE (Oxy IR/ROXICODONE) immediate release tablet 5 mg (5 mg Oral Given 01/29/22 1100)  ED Course/ Medical Decision Making/ A&P                           Medical Decision Making Amount and/or Complexity of Data Reviewed Radiology: ordered.  Risk Prescription drug management.   56 yo F with a chief complaint of a fall.  This occurred yesterday.  She was concerned because she was having a mild headache off and on.  She denies confusion denies vomiting.  She supposed be on Plavix but has not started taking it yet.  She is however endorsing that she is having diplopia but only with the left eye.  There is no extraocular motion abnormality that I can appreciate on exam.  Will obtain CT imaging to assess for possible facial fracture with entrapment.  Patient also complaining of some chronic back pain.  She actually tells me that its not that significant currently.  We will give her a dose of medication here for comfort.  Patient CT imaging is negative.  We will have her follow-up with the ophthalmologist for the monocular diplopia.  We will have her follow-up with her family doctor or neurologist for recurrent falls.  Follow-up with her orthopedic doctor for her chronic back pain.  1:04 PM:  I have discussed the diagnosis/risks/treatment options with the  patient.  Evaluation and diagnostic testing in the emergency department does not suggest an emergent condition requiring admission or immediate intervention beyond what has been performed at this time.  They will follow up with  PCP, optho, ortho. We also discussed returning to the ED immediately if new or worsening sx occur. We discussed the sx which are most concerning (e.g., sudden worsening pain, fever, inability to tolerate by mouth) that necessitate immediate return. Medications administered to the patient during their visit and any new prescriptions provided to the patient are listed below.  Medications given during this visit Medications  ketorolac (TORADOL) 15 MG/ML injection 15 mg ( Intramuscular Patient Refused/Not Given 01/29/22 1101)  oxyCODONE (Oxy IR/ROXICODONE) immediate release tablet 5 mg (5 mg Oral Given 01/29/22 1100)     The patient appears reasonably screen and/or stabilized for discharge and I doubt any other medical condition or other [facility] requiring further screening, evaluation, or treatment in the ED at this time prior to discharge.          Final Clinical Impression(s) / ED Diagnoses Final diagnoses:  Fall, initial encounter    Rx / DC Orders ED Discharge Orders     None         Deno Etienne, DO 01/29/22 1304

## 2022-01-29 NOTE — Discharge Instructions (Signed)
There was no broken bone in your face no change to the inside of your head.  Please follow-up with your doctor take care of your back as well as her family doctor and probably her neurologist.  If you have new vision changes to that eye then seeing your ophthalmologist would be a good idea.  If you do not have an ophthalmologist I have provided information for the one that is on-call today.  Please give them a call and set up an appointment so she can

## 2022-01-30 ENCOUNTER — Ambulatory Visit: Payer: Medicaid Other | Admitting: Nurse Practitioner

## 2022-01-30 ENCOUNTER — Ambulatory Visit: Payer: Self-pay

## 2022-01-30 NOTE — Telephone Encounter (Signed)
  Chief Complaint: falls Symptoms: pt fell twice in past 2 days d/t dog Frequency: ongoing Pertinent Negatives: Patient denies injury Disposition: [] ED /[] Urgent Care (no appt availability in office) / [x] Appointment(In office/virtual)/ []  [place] [facility]/ [] Home Care/ [] Refused Recommended Disposition /[] [facility]/ []  Follow-up with PCP Additional Notes: Pt has a history of frequent falls. Pt fell yesterday and went to ED for evaluation. Pt fell again today. Both times due to her large dog.  Reason for Disposition  [1] Fall AND [2] went to emergency department for evaluation or treatment  Answer Assessment - Initial Assessment Questions 1. MECHANISM: "How did the fall happen?"     Dog knocked her down 2. DOMESTIC VIOLENCE AND ELDER ABUSE SCREENING: "Did you fall because someone pushed you or tried to hurt you?" If Yes, ask: "Are you safe now?"     dog 3. ONSET: "When did the fall happen?" (e.g., minutes, hours, or days ago)     20-30 minutes 4. LOCATION: "What part of the body hit the ground?" (e.g., back, buttocks, head, hips, knees, hands, head, stomach)     head 5. INJURY: "Did you hurt (injure) yourself when you fell?" If Yes, ask: "What did you injure? Tell me more about this?" (e.g., body area; type of injury; pain severity)"     no 6. PAIN: "Is there any pain?" If Yes, ask: "How bad is the pain?" (e.g., Scale 1-10; or mild,  moderate, severe)   - NONE (0): No pain   - MILD (1-3): Doesn't interfere with normal activities    - MODERATE (4-7): Interferes with normal activities or awakens from sleep    - SEVERE (8-10): Excruciating pain, unable to do any normal activities      no 7. SIZE: For cuts, bruises, or swelling, ask: "How large is it?" (e.g., inches or centimeters)      no 8. PREGNANCY: "Is there any chance you are pregnant?" "When was your last menstrual period?"     na 9. OTHER SYMPTOMS: "Do you have any other symptoms?" (e.g., dizziness, fever,  weakness; new onset or worsening).      no 10. CAUSE: "What do you think caused the fall (or falling)?" (e.g., tripped, dizzy spell)       Dog  Protocols used: Falls and [facility]

## 2022-02-12 DIAGNOSIS — Z5181 Encounter for therapeutic drug level monitoring: Secondary | ICD-10-CM | POA: Diagnosis not present

## 2022-02-12 DIAGNOSIS — R69 Illness, unspecified: Secondary | ICD-10-CM | POA: Diagnosis not present

## 2022-02-12 DIAGNOSIS — F1320 Sedative, hypnotic or anxiolytic dependence, uncomplicated: Secondary | ICD-10-CM | POA: Diagnosis not present

## 2022-02-12 DIAGNOSIS — Z79899 Other long term (current) drug therapy: Secondary | ICD-10-CM | POA: Diagnosis not present

## 2022-02-13 ENCOUNTER — Ambulatory Visit: Payer: Self-pay | Admitting: Family Medicine

## 2022-02-13 ENCOUNTER — Ambulatory Visit: Payer: Self-pay | Admitting: *Deleted

## 2022-02-13 ENCOUNTER — Ambulatory Visit: Payer: Self-pay

## 2022-02-13 ENCOUNTER — Encounter: Payer: Self-pay | Admitting: Family Medicine

## 2022-02-13 VITALS — BP 148/92 | HR 90 | Resp 16 | Ht 62.0 in | Wt 215.0 lb

## 2022-02-13 DIAGNOSIS — E119 Type 2 diabetes mellitus without complications: Secondary | ICD-10-CM

## 2022-02-13 DIAGNOSIS — G4730 Sleep apnea, unspecified: Secondary | ICD-10-CM

## 2022-02-13 DIAGNOSIS — R062 Wheezing: Secondary | ICD-10-CM | POA: Insufficient documentation

## 2022-02-13 DIAGNOSIS — M7989 Other specified soft tissue disorders: Secondary | ICD-10-CM | POA: Diagnosis not present

## 2022-02-13 DIAGNOSIS — Z8673 Personal history of transient ischemic attack (TIA), and cerebral infarction without residual deficits: Secondary | ICD-10-CM

## 2022-02-13 MED ORDER — FUROSEMIDE 40 MG PO TABS: 40.0000 mg | ORAL_TABLET | Freq: Every day | ORAL | 0 refills | Status: DC | PRN

## 2022-02-13 MED ORDER — BUDESONIDE-FORMOTEROL FUMARATE 160-4.5 MCG/ACT IN AERO
2.0000 | INHALATION_SPRAY | Freq: Two times a day (BID) | RESPIRATORY_TRACT | 0 refills | Status: DC
Start: 2022-02-13 — End: 2022-02-20

## 2022-02-13 MED ORDER — ALBUTEROL SULFATE HFA 108 (90 BASE) MCG/ACT IN AERS: 2.0000 | INHALATION_SPRAY | Freq: Four times a day (QID) | RESPIRATORY_TRACT | 0 refills | Status: DC | PRN

## 2022-02-13 NOTE — Telephone Encounter (Signed)
  Chief Complaint: bilateral feet swelling Symptoms: painful bilateral feet swelling Frequency: 2 weeks Pertinent Negatives: Patient denies   Disposition: [] ED /[] Urgent Care (no appt availability in office) / [x] Appointment(In office/virtual)/ []  [place] [facility]/ [] Home Care/ [] Refused Recommended Disposition /[] [place] [facility]/ []  Follow-up with PCP Additional Notes:

## 2022-02-13 NOTE — Patient Instructions (Addendum)
It was great to see you!  Our plans for today:  - Take furosemide 40mg  in the morning. If you are still having swelling 6-8 hours after your first dose, take a second dose.  - Use your Symbicort (budesonide-fomoterol) inhaler 2 puffs twice daily. Use your albuterol inhaler only if you are having trouble breathing.  - Wear compression socks or tight socks to help push the fluid up your leg.  - come back next week for follow up. Bring ALL of your medications with you to follow up. Bring your husband if he is able to come since he helps with your medications.   We are checking some labs today, we will release these results to your MyChart.  Take care and seek immediate care sooner if you develop any concerns.   Dr. 

## 2022-02-13 NOTE — Assessment & Plan Note (Signed)
Obtain A1c today.

## 2022-02-13 NOTE — Progress Notes (Signed)
   SUBJECTIVE:   CHIEF COMPLAINT / HPI:   Patient new to our office today and presents for acute visit.   LEG SWELLING - Having leg swelling for 2 weeks. - Location: b/l feet and lower legs - Timing of swelling: constant. - New medications: no - Painful with walking a lot. - She is unsure if she is taking lasix. Initially said she is taking twice daily since swelling increased (usually only takes once daily) but does not have lasix bottle with her. Then says she is unsure if she ran out. Husband manages medication for her and fills her pill box.  - Previously seen at [facility], been about a month and a half since last seen and out of some of her medications. Hasn't used her inhaler in a while. Otherwise denies any medication changes recently. - reports h/o stroke with memory difficulties since.  - Last smoked 4 months ago. Currently using non-nicotine vaping. - Denies h/o kidney, heart, liver problems or h/o cancer. - has h/o superficial venous thrombosis of L arm. Reports she is on plavix but this is not on medication list nor with her today. - reports compliance with CPAP - denies redness, chest pain, bloody BM, weight loss, abdomen swelling. - denies orthopnea. Sleeping in recliner past few months due to back pain.  - denies shortness of breath but does report hasn't been moving as much cause she gets short of breath when she does.  - denies recent long trips, recent surgeries, or immobility.   Previously evaluated by Cardiology 06/2021 for lower extremity edema with recommendations for no further diagnostic testing due to multiple previous unrevealing testing.  ECHO 2017, technically difficult study, with LVEF 65-70%, G1DD.  OBJECTIVE:   BP (!) 148/92   Pulse 90   Resp 16   Ht 5\' 2"  (1.575 m)   Wt 215 lb (97.5 kg)   SpO2 100%   BMI 39.32 kg/m   Gen: well appearing, in NAD Card: RRR Lungs: end expiratory wheezing throughout. Speaks in full sentences. Comfortable WOB on RA,  appropriately saturated. Ext: WWP, 2+ pitting edema to shins bilaterally. Negative homan's. No redness or asymmetry.    ASSESSMENT/PLAN:   Type 2 diabetes mellitus (HCC) Obtain A1c today.  Leg swelling New to our office today and hasn't seen primary care in at least 1.5 months. Unclear if has been taking lasix as her health insight and knowledge is poor. Husband manages medication for her and she is unsure of what she takes. Previously evaluated by Cardiology 06/2021 with previous ECHO 2017 with LVEF 65-70% and G1DD. Will refill lasix for her and obtain labs to assess CHF exacerbation.   Continue BP regimen, CPAP. Counseled on compression stockings. Exam and symptoms not consistent with DVT, cellulitis, compartment syndrome. F/u next week. Advised to bring ALL medications with her and husband/son to more accurately assess what she is taking at home.   Wheezing Heard on exam today and with prior use of symbicort though no formal diagnosis listed in our chart. She is unsure how long she has been out of inhalers. Will refill today. Will attempt to obtain prior records at follow up next week.   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     2018, DO

## 2022-02-13 NOTE — Telephone Encounter (Signed)
Reason for Disposition  [1] MODERATE leg swelling (e.g., swelling extends up to knees) AND [2] new-onset or worsening  Answer Assessment - Initial Assessment Questions 1. ONSET: "When did the swelling start?" (e.g., minutes, hours, days)     2 weeks ago 2. LOCATION: "What part of the leg is swollen?"  "Are both legs swollen or just one leg?"     Both feet- does go into legs 3. SEVERITY: "How bad is the swelling?" (e.g., localized; mild, moderate, severe)  - Localized - small area of swelling localized to one leg  - MILD pedal edema - swelling limited to foot and ankle, pitting edema < 1/4 inch (6 mm) deep, rest and elevation eliminate most or all swelling  - MODERATE edema - swelling of lower leg to knee, pitting edema > 1/4 inch (6 mm) deep, rest and elevation only partially reduce swelling  - SEVERE edema - swelling extends above knee, facial or hand swelling present      moderate 4. REDNESS: "Does the swelling look red or infected?"     red 5. PAIN: "Is the swelling painful to touch?" If Yes, ask: "How painful is it?"   (Scale 1-10; mild, moderate or severe)     Hurts to walk 6. FEVER: "Do you have a fever?" If Yes, ask: "What is it, how was it measured, and when did it start?"      no 7. CAUSE: "What do you think is causing the leg swelling?"     Unsure 8. MEDICAL HISTORY: "Do you have a history of heart failure, kidney disease, liver failure, or cancer?"     *No Answer* 9. RECURRENT SYMPTOM: "Have you had leg swelling before?" If Yes, ask: "When was the last time?" "What happened that time?"     no 10. OTHER SYMPTOMS: "Do you have any other symptoms?" (e.g., chest pain, difficulty breathing)       no 11. PREGNANCY: "Is there any chance you are pregnant?" "When was your last menstrual period?"       *No Answer*  Protocols used: Leg Swelling and Edema-A-AH

## 2022-02-13 NOTE — Assessment & Plan Note (Signed)
With memory loss complicating care.

## 2022-02-13 NOTE — Assessment & Plan Note (Addendum)
New to our office today and hasn't seen primary care in at least 1.5 months. Unclear if has been taking lasix as her health insight and knowledge is poor. Husband manages medication for her and she is unsure of what she takes. Previously evaluated by Cardiology 06/2021 with previous ECHO 2017 with LVEF 65-70% and G1DD. Will refill lasix for her and obtain labs to assess CHF exacerbation.   Continue BP regimen, CPAP. Counseled on compression stockings. Exam and symptoms not consistent with DVT, cellulitis, compartment syndrome. F/u next week. Advised to bring ALL medications with her and husband/son to more accurately assess what she is taking at home.

## 2022-02-13 NOTE — Telephone Encounter (Signed)
Call dropped before speaking with pt. Another nurse was able to speak with pt.

## 2022-02-13 NOTE — Assessment & Plan Note (Signed)
Heard on exam today and with prior use of symbicort though no formal diagnosis listed in our chart. She is unsure how long she has been out of inhalers. Will refill today. Will attempt to obtain prior records at follow up next week.

## 2022-02-14 LAB — COMPREHENSIVE METABOLIC PANEL
AG Ratio: 1.4 (calc) (ref 1.0–2.5)
ALT: 16 U/L (ref 6–29)
AST: 17 U/L (ref 10–35)
Albumin: 4.3 g/dL (ref 3.6–5.1)
Alkaline phosphatase (APISO): 114 U/L (ref 37–153)
BUN: 12 mg/dL (ref 7–25)
CO2: 27 mmol/L (ref 20–32)
Calcium: 10.1 mg/dL (ref 8.6–10.4)
Chloride: 104 mmol/L (ref 98–110)
Creat: 0.79 mg/dL (ref 0.50–1.03)
Globulin: 3.1 g/dL (calc) (ref 1.9–3.7)
Glucose, Bld: 66 mg/dL (ref 65–99)
Potassium: 4.3 mmol/L (ref 3.5–5.3)
Sodium: 143 mmol/L (ref 135–146)
Total Bilirubin: 0.4 mg/dL (ref 0.2–1.2)
Total Protein: 7.4 g/dL (ref 6.1–8.1)

## 2022-02-14 LAB — HEMOGLOBIN A1C
Hgb A1c MFr Bld: 6 % of total Hgb — ABNORMAL HIGH (ref <5.7)
Mean Plasma Glucose: 126 mg/dL
eAG (mmol/L): 7 mmol/L

## 2022-02-14 LAB — CBC WITH DIFFERENTIAL/PLATELET
Absolute Monocytes: 345 cells/uL (ref 200–950)
Basophils Absolute: 18 cells/uL (ref 0–200)
Basophils Relative: 0.4 %
Eosinophils Absolute: 60 cells/uL (ref 15–500)
Eosinophils Relative: 1.3 %
HCT: 41.1 % (ref 35.0–45.0)
Hemoglobin: 13.6 g/dL (ref 11.7–15.5)
Lymphs Abs: 1716 cells/uL (ref 850–3900)
MCH: 30.5 pg (ref 27.0–33.0)
MCHC: 33.1 g/dL (ref 32.0–36.0)
MCV: 92.2 fL (ref 80.0–100.0)
MPV: 11.4 fL (ref 7.5–12.5)
Monocytes Relative: 7.5 %
Neutro Abs: 2461 cells/uL (ref 1500–7800)
Neutrophils Relative %: 53.5 %
Platelets: 160 10*3/uL (ref 140–400)
RBC: 4.46 10*6/uL (ref 3.80–5.10)
RDW: 12.2 % (ref 11.0–15.0)
Total Lymphocyte: 37.3 %
WBC: 4.6 10*3/uL (ref 3.8–10.8)

## 2022-02-14 LAB — LIPID PANEL
Cholesterol: 123 mg/dL (ref <200)
HDL: 73 mg/dL (ref >=50)
LDL Cholesterol (Calc): 35 mg/dL (calc)
Non-HDL Cholesterol (Calc): 50 mg/dL (calc) (ref <130)
Total CHOL/HDL Ratio: 1.7 (calc) (ref <5.0)
Triglycerides: 69 mg/dL (ref <150)

## 2022-02-14 LAB — BRAIN NATRIURETIC PEPTIDE: Brain Natriuretic Peptide: 9 pg/mL (ref <100)

## 2022-02-15 ENCOUNTER — Telehealth: Payer: Self-pay | Admitting: Internal Medicine

## 2022-02-15 NOTE — Telephone Encounter (Signed)
Were you going to order something? I do not see any imaging orders or referrals?

## 2022-02-15 NOTE — Telephone Encounter (Signed)
Copied from CRM [id number]. Topic: General - Other >> Feb 15, 2022  9:16 AM Stephanie [name] J wrote: Reason for CRM: Pt was advised she would get imagineg of her heart and liver / pt hasnt heard anything about this imagaing or an appt and called to find out what she needs to do/ please advise

## 2022-02-17 ENCOUNTER — Emergency Department (HOSPITAL_COMMUNITY)
Admission: EM | Admit: 2022-02-17 | Discharge: 2022-02-17 | Disposition: A | Discharge status: Home / Self Care | Payer: Medicaid Other | Attending: Emergency Medicine | Admitting: Emergency Medicine

## 2022-02-17 ENCOUNTER — Encounter (HOSPITAL_COMMUNITY): Payer: Self-pay | Admitting: Emergency Medicine

## 2022-02-17 ENCOUNTER — Emergency Department (HOSPITAL_COMMUNITY): Payer: Medicaid Other

## 2022-02-17 ENCOUNTER — Other Ambulatory Visit: Payer: Self-pay

## 2022-02-17 DIAGNOSIS — N209 Urinary calculus, unspecified: Secondary | ICD-10-CM | POA: Insufficient documentation

## 2022-02-17 DIAGNOSIS — G43809 Other migraine, not intractable, without status migrainosus: Secondary | ICD-10-CM | POA: Diagnosis not present

## 2022-02-17 DIAGNOSIS — G43709 Chronic migraine without aura, not intractable, without status migrainosus: Secondary | ICD-10-CM | POA: Diagnosis not present

## 2022-02-17 DIAGNOSIS — Z7984 Long term (current) use of oral hypoglycemic drugs: Secondary | ICD-10-CM | POA: Diagnosis not present

## 2022-02-17 DIAGNOSIS — Z79899 Other long term (current) drug therapy: Secondary | ICD-10-CM | POA: Diagnosis not present

## 2022-02-17 DIAGNOSIS — N10 Acute tubulo-interstitial nephritis: Secondary | ICD-10-CM | POA: Insufficient documentation

## 2022-02-17 DIAGNOSIS — R519 Headache, unspecified: Secondary | ICD-10-CM | POA: Diagnosis present

## 2022-02-17 LAB — CBC WITH DIFFERENTIAL/PLATELET
Abs Immature Granulocytes: 0.01 10*3/uL (ref 0.00–0.07)
Basophils Absolute: 0 10*3/uL (ref 0.0–0.1)
Basophils Relative: 1 %
Eosinophils Absolute: 0.1 10*3/uL (ref 0.0–0.5)
Eosinophils Relative: 1 %
HCT: 47 % — ABNORMAL HIGH (ref 36.0–46.0)
Hemoglobin: 15.2 g/dL — ABNORMAL HIGH (ref 12.0–15.0)
Immature Granulocytes: 0 %
Lymphocytes Relative: 25 %
Lymphs Abs: 1.7 10*3/uL (ref 0.7–4.0)
MCH: 30.6 pg (ref 26.0–34.0)
MCHC: 32.3 g/dL (ref 30.0–36.0)
MCV: 94.8 fL (ref 80.0–100.0)
Monocytes Absolute: 0.5 10*3/uL (ref 0.1–1.0)
Monocytes Relative: 8 %
Neutro Abs: 4.3 10*3/uL (ref 1.7–7.7)
Neutrophils Relative %: 65 %
Platelets: 162 10*3/uL (ref 150–400)
RBC: 4.96 MIL/uL (ref 3.87–5.11)
RDW: 12.9 % (ref 11.5–15.5)
WBC: 6.7 10*3/uL (ref 4.0–10.5)
nRBC: 0 % (ref 0.0–0.2)

## 2022-02-17 LAB — COMPREHENSIVE METABOLIC PANEL
ALT: 24 U/L (ref 0–44)
AST: 23 U/L (ref 15–41)
Albumin: 5 g/dL (ref 3.5–5.0)
Alkaline Phosphatase: 114 U/L (ref 38–126)
Anion gap: 10 (ref 5–15)
BUN: 15 mg/dL (ref 6–20)
CO2: 30 mmol/L (ref 22–32)
Calcium: 10.1 mg/dL (ref 8.9–10.3)
Chloride: 101 mmol/L (ref 98–111)
Creatinine, Ser: 0.86 mg/dL (ref 0.44–1.00)
GFR, Estimated: >60 mL/min (ref >60)
Glucose, Bld: 110 mg/dL — ABNORMAL HIGH (ref 70–99)
Potassium: 3.7 mmol/L (ref 3.5–5.1)
Sodium: 141 mmol/L (ref 135–145)
Total Bilirubin: 0.8 mg/dL (ref 0.3–1.2)
Total Protein: 8.9 g/dL — ABNORMAL HIGH (ref 6.5–8.1)

## 2022-02-17 LAB — URINALYSIS, ROUTINE W REFLEX MICROSCOPIC
Bilirubin Urine: NEGATIVE
Glucose, UA: NEGATIVE mg/dL
Hgb urine dipstick: NEGATIVE
Ketones, ur: NEGATIVE mg/dL
Nitrite: NEGATIVE
Protein, ur: NEGATIVE mg/dL
Specific Gravity, Urine: 1.006 (ref 1.005–1.030)
WBC, UA: >50 WBC/hpf — ABNORMAL HIGH (ref 0–5)
pH: 6 (ref 5.0–8.0)

## 2022-02-17 LAB — LIPASE, BLOOD: Lipase: 35 U/L (ref 11–51)

## 2022-02-17 MED ORDER — LACTATED RINGERS IV BOLUS
500.0000 mL | Freq: Once | INTRAVENOUS | Status: AC
  Administered 2022-02-17: 500 mL via INTRAVENOUS

## 2022-02-17 MED ORDER — CEFPODOXIME PROXETIL 200 MG PO TABS: 200.0000 mg | ORAL_TABLET | Freq: Two times a day (BID) | ORAL | 0 refills | Status: DC

## 2022-02-17 MED ORDER — SODIUM CHLORIDE 0.9 % IV SOLN
1.0000 g | Freq: Once | INTRAVENOUS | Status: AC
  Administered 2022-02-17: 1 g via INTRAVENOUS
  Filled 2022-02-17: qty 10

## 2022-02-17 MED ORDER — PROCHLORPERAZINE EDISYLATE 10 MG/2ML IJ SOLN
10.0000 mg | Freq: Once | INTRAMUSCULAR | Status: AC
  Administered 2022-02-17: 10 mg via INTRAVENOUS
  Filled 2022-02-17: qty 2

## 2022-02-17 MED ORDER — DIPHENHYDRAMINE HCL 50 MG/ML IJ SOLN
25.0000 mg | Freq: Once | INTRAMUSCULAR | Status: AC
Start: 2022-02-17 — End: 2022-02-17
  Administered 2022-02-17: 25 mg via INTRAVENOUS
  Filled 2022-02-17: qty 1

## 2022-02-17 NOTE — ED Provider Notes (Signed)
[facility]-EMERGENCY DEPT Provider Note   CSN: [id number] Arrival date & time: 02/17/22  5784     History  Chief Complaint  Patient presents with   Migraine   Flank Pain    Stephanie [name] is a 56 y.o. female.  HPI Patient reports she has a typical migraine.  She has pain behind her forehead and behind her nose and eye particularly to the right side.  She reports it does make her nauseated but she is not having any vomiting.  Reports the only thing that can help with a headache is morphine or Dilaudid.  She reports she has migraine medications to take at home but the do not do anything.  Patient denies any fever or neck stiffness.  No visual changes.  Patient seems slightly oversedated.  I asked about home medications and her prescribed Valium, which visit with neurology indicates was recommended to decrease and discontinue due to history of oversedation.  Patient reports that is her migraine headache that is making her groggy, and her speech, which I noted sounds slightly thick, is due to not having had anything to drink for about 4 hours since coming to the emergency department per the patient.  She also notes that her provider did stop prescribing Valium, and changed her to Xanax.  Second complaint is for flank pain and abdominal pain.  Patient reports she thinks her kidney stones are active.  She is got deep pain in her back and sides.  Patient however denies that she has had any fever.  As with the report about the migraine no active vomiting but some nausea.  Patient does not specifically endorse burning urgency with urination.  She reports she is urinating a fair amount.  She denies chest pain or shortness of breath.  Patient's feet are mildly swollen but she reports its not much compared to what is normal for her.    Home Medications Prior to Admission medications   Medication Sig Start Date End Date Taking? Authorizing Provider  cefpodoxime (VANTIN) 200 MG  tablet Take 1 tablet (200 mg total) by mouth 2 (two) times daily. 02/17/22  Yes Arby Barrette, MD  albuterol (VENTOLIN HFA) 108 (90 Base) MCG/ACT inhaler Inhale 2 puffs into the lungs every 6 (six) hours as needed for wheezing or shortness of breath. 02/13/22   Caro Laroche, DO  ARIPiprazole (ABILIFY) 15 MG tablet Take 15 mg by mouth every morning. 06/10/21   [provider]  budesonide-formoterol (SYMBICORT) 160-4.5 MCG/ACT inhaler Inhale 2 puffs into the lungs 2 (two) times daily. 02/13/22   Caro Laroche, DO  CAPLYTA 42 MG capsule Take 42 mg by mouth every evening. 06/08/21   [provider]  cetirizine (ZYRTEC) 10 MG tablet Take 10 mg by mouth at bedtime as needed for allergies. 01/07/21   [provider]  cholecalciferol (VITAMIN D3) 25 MCG (1000 UT) tablet Take 1,000 Units by mouth daily.    [provider]  Continuous Blood Gluc Sensor (FREESTYLE LIBRE 2 SENSOR) MISC USE WITH READER TO CHECK SUGARS DAILY 02/01/22   [provider]  Cyanocobalamin (VITAMIN B-12 PO) Take 1 tablet by mouth daily.    [provider]  Fremanezumab-vfrm (AJOVY) 225 MG/1.5ML SOAJ Inject 225 mg into the skin every 30 (thirty) days. 10/17/20   Butch Penny, NP  furosemide (LASIX) 40 MG tablet Take 1 tablet (40 mg total) by mouth daily as needed for edema. 02/13/22   Caro Laroche, DO  glipiZIDE (  GLUCOTROL) 5 MG tablet Take 1 tablet (5 mg total) by mouth 2 (two) times daily before a meal. 04/30/16   Lora PaulaKrall, Jennifer T, MD  KLOR-CON M10 10 MEQ tablet Take 10 mEq by mouth daily as needed (when taking furosemide). 01/23/21   [provider]  lamoTRIgine (LAMICTAL) 100 MG tablet Take 1 tablet (100 mg total) by mouth 2 (two) times daily. 06/20/20   York SpanielWillis, Charles K, MD  levETIRAcetam (KEPPRA) 250 MG tablet Take 250 mg by mouth 2 (two) times daily. 01/15/22   [provider]  losartan (COZAAR) 100 MG tablet Take 100 mg by mouth daily. 01/23/21   [provider]  metoprolol succinate (TOPROL-XL) 25 MG 24 hr tablet Take 25 mg by mouth daily. 11/12/20   [provider]  omeprazole (PRILOSEC) 40 MG capsule Take 40 mg by mouth daily. 08/13/19   [provider]  oxyCODONE-acetaminophen (PERCOCET) 5-325 MG tablet Take 1 tablet by mouth every 6 (six) hours as needed. Patient not taking: Reported on 02/13/2022 10/31/21   Charlynne PanderYao, David Hsienta, MD  phentermine (ADIPEX-P) 37.5 MG tablet Take 37.5 mg by mouth every morning. Patient not taking: Reported on 02/13/2022 01/23/21   [provider]  prazosin (MINIPRESS) 1 MG capsule Take 3 mg by mouth every evening. 02/15/21   [provider]  pregabalin (LYRICA) 150 MG capsule Take 150 mg by mouth at bedtime. Patient not taking: Reported on 02/13/2022 12/05/20   [provider]  Rimegepant Sulfate (NURTEC) 75 MG TBDP Take 1 tablet daily onset of migraine. 07/20/20   Butch PennyMillikan, Megan, NP  rosuvastatin (CRESTOR) 20 MG tablet SMARTSIG:1 Tablet(s) By Mouth Every Evening 12/29/21   [provider]  traZODone (DESYREL) 150 MG tablet Take 150 mg by mouth at bedtime as needed for sleep.  05/30/20   [provider]  TRINTELLIX 20 MG TABS tablet Take 20 mg by mouth daily. 01/22/21   [provider]      Allergies    Gabapentin, Ibuprofen, Sulfa antibiotics, Tylenol [acetaminophen], Aspirin, Buprenorphine hcl, Carbamazepine, Codeine, Compazine, Elemental sulfur, Ioxaglate, Ivp dye [iodinated contrast media], Metrizamide, Naproxen, Norco [hydrocodone-acetaminophen], Other, Penicillin g, Prochlorperazine maleate, Reglan [metoclopramide], Toradol [ketorolac tromethamine], Tramadol, and Zofran [ondansetron hcl]    Review of Systems   Review of Systems 10 Systems reviewed negative except as per HPI Physical Exam Updated Vital Signs BP 129/73   Pulse 78   Temp 98 F (36.7 C)   Resp 16   SpO2 96%  Physical Exam Constitutional:      Comments: Patient is  nontoxic.  No respiratory distress.  She is wearing reflective sunglasses.  HENT:     Mouth/Throat:     Mouth: Mucous membranes are moist.     Pharynx: Oropharynx is clear.  Eyes:     Extraocular Movements: Extraocular movements intact.     Conjunctiva/sclera: Conjunctivae normal.     Pupils: Pupils are equal, round, and reactive to light.     Comments: Pupils are about 2 mm and responsive.  Cardiovascular:     Rate and Rhythm: Normal rate and regular rhythm.  Pulmonary:     Effort: Pulmonary effort is normal.     Breath sounds: Normal breath sounds.  Abdominal:     General: There is no distension.     Palpations: Abdomen is soft.     Tenderness: There is abdominal tenderness. There is no guarding.     Comments: Patient is abdomen is soft.  She endorses some mild diffuse tenderness but  does not have any guarding.  Musculoskeletal:        General: Normal range of motion.     Cervical back: Neck supple.     Comments: 1+ edema bilateral feet symmetric.  Feet and lower legs are in good condition without any active wounds or joint effusions  Skin:    General: Skin is warm and dry.  Neurological:     Comments: Patient awakens to voice.  She does stay awake during the interview but her voice sounds slightly thick and mildly slurred.  She has the appearance of being sedated but not significantly confused.  EXTR ocular motions intact.  Motor grip strength 5\5.  Patient can independently raise each leg and hold briefly.     ED Results / Procedures / Treatments   Labs (all labs ordered are listed, but only abnormal results are displayed) Labs Reviewed  COMPREHENSIVE METABOLIC PANEL - Abnormal; Notable for the following components:      Result Value   Glucose, Bld 110 (*)    Total Protein 8.9 (*)    All other components within normal limits  CBC WITH DIFFERENTIAL/PLATELET - Abnormal; Notable for the following components:   Hemoglobin 15.2 (*)    HCT 47.0 (*)    All other components  within normal limits  URINALYSIS, ROUTINE W REFLEX MICROSCOPIC - Abnormal; Notable for the following components:   Color, Urine STRAW (*)    APPearance HAZY (*)    Leukocytes,Ua LARGE (*)    WBC, UA >50 (*)    Bacteria, UA FEW (*)    All other components within normal limits  URINE CULTURE  LIPASE, BLOOD    EKG None  Radiology CT ABDOMEN PELVIS WO CONTRAST  Result Date: 02/17/2022 CLINICAL DATA:  Abdominal pain right lower quadrant, right flank EXAM: CT ABDOMEN AND PELVIS WITHOUT CONTRAST TECHNIQUE: Multidetector CT imaging of the abdomen and pelvis was performed following the standard protocol without IV contrast. RADIATION DOSE REDUCTION: This exam was performed according to the departmental dose-optimization program which includes automated exposure control, adjustment of the mA and/or kV according to patient size and/or use of iterative reconstruction technique. COMPARISON:  10/31/2021 FINDINGS: Lower chest: Aortic Atherosclerosis (ICD10-170.0). No pleural or pericardial effusion. Hepatobiliary: No focal liver abnormality is seen. No gallstones, gallbladder wall thickening, or biliary dilatation. Pancreas: Unremarkable. No pancreatic ductal dilatation or surrounding inflammatory changes. Spleen: Normal in size without focal abnormality. Adrenals/Urinary Tract: No adrenal mass. Bilateral renal calcifications more numerous right than left. Largest calcification on the right peripherally in the upper pole 6 mm, on the left in the upper pole 5 mm. Lobular right renal contour as before. No hydronephrosis. Urinary bladder physiologically distended. Stomach/Bowel: Stomach and small bowel unremarkable. Normal appendix. The colon is incompletely distended, unremarkable. Vascular/Lymphatic: Aortic Atherosclerosis (ICD10-170.0). No abdominal or pelvic adenopathy. Reproductive: Status post hysterectomy. No adnexal masses. Other: Left pelvic phlebolith.  No ascites.  No free air. Musculoskeletal: Lumbar  spondylitic change most marked L5-S1. No acute findings. IMPRESSION: 1. Bilateral nonobstructive urolithiasis. 2.  Aortic Atherosclerosis (ICD10-170.0). Electronically Signed   By: Corlis Leak M.D.   On: 02/17/2022 11:20    Procedures Procedures    Medications Ordered in ED Medications  prochlorperazine (COMPAZINE) injection 10 mg (10 mg Intravenous Given 02/17/22 1330)  diphenhydrAMINE (BENADRYL) injection 25 mg (25 mg Intravenous Given 02/17/22 1331)  cefTRIAXone (ROCEPHIN) 1 g in sodium chloride 0.9 % 100 mL IVPB (0 g Intravenous Stopped 02/17/22 1532)  lactated ringers bolus 500 mL (0 mLs Intravenous  Stopped 02/17/22 1533)    ED Course/ Medical Decision Making/ A&P                           Medical Decision Making Amount and/or Complexity of Data Reviewed Labs: ordered.  Risk Prescription drug management.   Patient's first complaint is regarding a migraine headache.  She reports it is a typical headache.  Review of EMR indicates she has been seen for longstanding headaches and treated by neurology.  Patient has had an MRI 11\7\2022 which showed chronic encephalomalacia.  Patient had a CT scan of the head [phone] without acute changes.  At this time I do not feel that further imaging is indicated.  Due to sedated appearance and history of sedating medications as etiology for imbalance, frequent falls and mental status change, I do not feel that adding narcotic pain medications as appropriate.  Patient reports the only medications that do alleviate her migraines are Dilaudid or morphine.  At this time we will proceed with fluid resuscitation.  Patient has been given compazine and Benadryl for chronic migraines.  Urinalysis does show greater than 50 white blood cells and specimen appears good.  We will proceed with urine culture.  CT scan interpreted by radiology shows stable stones in the kidneys but no inflammatory changes or other acute findings.  She does not have leukocytosis.  At  this time, with no fever, stable vital signs, no leukocytosis, no inflammatory changes on CT, will opt to treat for pyelonephritis\UTI based on urine specimen and patient's pain complaint.  Patient is given a gram of Rocephin IV.  She is rehydrated in the emergency department.  Patient is up and ambulatory to the bathroom without difficulty.  She is coordinated.  At this time I do feel she is stable for discharge.  Migraine is consistent with chronic migraine syndrome.  Patient has had multiple different therapies.  She has had extensive imaging already this year and I do not identify any acute changes that I think indicate new imaging studies.  Plan will be for discharge with cefpodoxime for UTI with complaints of flank and back pain.  Discharge instructions include return precautions and home management.        Final Clinical Impression(s) / ED Diagnoses Final diagnoses:  Acute pyelonephritis  Chronic migraine without aura without status migrainosus, not intractable    Rx / DC Orders ED Discharge Orders          Ordered    cefpodoxime (VANTIN) 200 MG tablet  2 times daily        02/17/22 1542              Arby Barrette, MD 02/17/22 1548

## 2022-02-17 NOTE — ED Triage Notes (Signed)
Pt reports migraine since yesterday and right flank pain x 4 days due to kidney stones.

## 2022-02-17 NOTE — ED Notes (Signed)
Urine sample attempted, pt was unsuccessful at giving sample.

## 2022-02-17 NOTE — ED Provider Triage Note (Signed)
Emergency Medicine Provider Triage Evaluation Note  Stephanie [name] , a 56 y.o. female  was evaluated in triage.  Pt complains of right lower quadrant abdominal pain this been ongoing for couple of days.  She reports associated diarrhea but denies any nausea, vomiting, fever, chills.  She does also mention some increased urinary frequency however she is on Lasix.  Patient also complaining of a 4-day history of headache.  She states this is very typical of her normal migraines.'s been unchanged with Advil.  Review of Systems  Positive:  Negative: See above   Physical Exam  BP (!) 145/92 (BP Location: Left Arm)   Pulse 82   Resp 18   SpO2 98%  Gen:   Awake, no distress   Resp:  Normal effort  MSK:   Moves extremities without difficulty  Other:  Cranial nerves II through XII are intact.  5/5 strength to the upper and lower extremities.  Normal sensation to the upper and lower extremities. There is tenderness to the RLQ.  Medical Decision Making  Medically screening exam initiated at 10:31 AM.  Appropriate orders placed.  Stephanie [name] was informed that the remainder of the evaluation will be completed by another provider, this initial triage assessment does not replace that evaluation, and the importance of remaining in the ED until their evaluation is complete.     Honor Loh [place], [place] 02/17/22 [id number]

## 2022-02-18 NOTE — Telephone Encounter (Signed)
Left detailed vm °

## 2022-02-19 LAB — URINE CULTURE: Culture: <10000 — AB

## 2022-02-20 ENCOUNTER — Telehealth: Payer: Self-pay

## 2022-02-20 ENCOUNTER — Ambulatory Visit (INDEPENDENT_AMBULATORY_CARE_PROVIDER_SITE_OTHER): Payer: Medicaid Other | Admitting: Family Medicine

## 2022-02-20 ENCOUNTER — Encounter: Payer: Self-pay | Admitting: Family Medicine

## 2022-02-20 VITALS — BP 100/62 | HR 105 | Temp 98.3°F | Resp 16 | Ht 62.0 in | Wt 206.7 lb

## 2022-02-20 DIAGNOSIS — Z8673 Personal history of transient ischemic attack (TIA), and cerebral infarction without residual deficits: Secondary | ICD-10-CM

## 2022-02-20 DIAGNOSIS — I10 Essential (primary) hypertension: Secondary | ICD-10-CM | POA: Diagnosis not present

## 2022-02-20 DIAGNOSIS — Z1211 Encounter for screening for malignant neoplasm of colon: Secondary | ICD-10-CM

## 2022-02-20 DIAGNOSIS — N200 Calculus of kidney: Secondary | ICD-10-CM | POA: Diagnosis not present

## 2022-02-20 DIAGNOSIS — Z8619 Personal history of other infectious and parasitic diseases: Secondary | ICD-10-CM | POA: Diagnosis not present

## 2022-02-20 DIAGNOSIS — R296 Repeated falls: Secondary | ICD-10-CM

## 2022-02-20 DIAGNOSIS — Z8541 Personal history of malignant neoplasm of cervix uteri: Secondary | ICD-10-CM

## 2022-02-20 DIAGNOSIS — R062 Wheezing: Secondary | ICD-10-CM

## 2022-02-20 DIAGNOSIS — B182 Chronic viral hepatitis C: Secondary | ICD-10-CM

## 2022-02-20 DIAGNOSIS — K219 Gastro-esophageal reflux disease without esophagitis: Secondary | ICD-10-CM | POA: Diagnosis not present

## 2022-02-20 DIAGNOSIS — I615 Nontraumatic intracerebral hemorrhage, intraventricular: Secondary | ICD-10-CM | POA: Diagnosis not present

## 2022-02-20 DIAGNOSIS — E538 Deficiency of other specified B group vitamins: Secondary | ICD-10-CM

## 2022-02-20 DIAGNOSIS — M5440 Lumbago with sciatica, unspecified side: Secondary | ICD-10-CM

## 2022-02-20 DIAGNOSIS — E119 Type 2 diabetes mellitus without complications: Secondary | ICD-10-CM

## 2022-02-20 DIAGNOSIS — E2839 Other primary ovarian failure: Secondary | ICD-10-CM

## 2022-02-20 DIAGNOSIS — I611 Nontraumatic intracerebral hemorrhage in hemisphere, cortical: Secondary | ICD-10-CM

## 2022-02-20 DIAGNOSIS — F319 Bipolar disorder, unspecified: Secondary | ICD-10-CM

## 2022-02-20 DIAGNOSIS — Z79899 Other long term (current) drug therapy: Secondary | ICD-10-CM | POA: Diagnosis not present

## 2022-02-20 DIAGNOSIS — M7989 Other specified soft tissue disorders: Secondary | ICD-10-CM

## 2022-02-20 DIAGNOSIS — G8929 Other chronic pain: Secondary | ICD-10-CM

## 2022-02-20 DIAGNOSIS — Z8669 Personal history of other diseases of the nervous system and sense organs: Secondary | ICD-10-CM

## 2022-02-20 DIAGNOSIS — E785 Hyperlipidemia, unspecified: Secondary | ICD-10-CM

## 2022-02-20 MED ORDER — BREZTRI AEROSPHERE 160-9-4.8 MCG/ACT IN AERO: 2.0000 | INHALATION_SPRAY | Freq: Two times a day (BID) | RESPIRATORY_TRACT | 3 refills | Status: DC

## 2022-02-20 MED ORDER — METFORMIN HCL 500 MG PO TABS: 500.0000 mg | ORAL_TABLET | Freq: Two times a day (BID) | ORAL | 3 refills | Status: DC

## 2022-02-20 NOTE — Telephone Encounter (Signed)
CALLED PATIENT NO ANSWER LEFT VOICEMAIL FOR A CALL BACK ? ?

## 2022-02-20 NOTE — Assessment & Plan Note (Signed)
Tolerant of PPI, continue.

## 2022-02-20 NOTE — Assessment & Plan Note (Signed)
With multiple sedating medications contributing to frequent falls and tired appearance though improved from last week since changing valium to xanax. Will obtain records from psychiatry. Strongly recommend reducing dose of sedating meds as able to control symptoms without contributing to falls.

## 2022-02-20 NOTE — Assessment & Plan Note (Signed)
LDL at goal. Tolerant of statin, continue.

## 2022-02-20 NOTE — Assessment & Plan Note (Signed)
Improved though unsure how she is taking lasix. Instructed to take daily with additional pill in afternoon if remains edematous. F/u next month.

## 2022-02-20 NOTE — Assessment & Plan Note (Signed)
Continue to follow with ortho and PMR. Agree with HHPT as ordered by Neuro. Goal to avoid chronic narcotics given frequent falls and previous substance use d/o.

## 2022-02-20 NOTE — Assessment & Plan Note (Signed)
With encephalomalacia seen on prior imaging and resultant memory loss complicating care.

## 2022-02-20 NOTE — Assessment & Plan Note (Signed)
CT from recent ED visit reviewed with stable stones, no urinary obstruction seen.

## 2022-02-20 NOTE — Assessment & Plan Note (Addendum)
Previously evaluated by Neuro with multiple ED visits for same. Likely multifactorial and largely iatrogenic. On multiple sedating psychiatric meds though appears improved from last week since change from valium to xanax. Will stop losartan and metoprolol given lower BP. Also changing glipizide to metformin given concern for hypoglycemia. Also concern about medication adherence given patient does not manage her own medication and not sure what she takes, pharmacy consulted for polypharmacy and med adherence. Labs previously obtained by Neuro reviewed. F/u next month.

## 2022-02-20 NOTE — Assessment & Plan Note (Signed)
S/p treatment per patient. Obtaining serologies.

## 2022-02-20 NOTE — Assessment & Plan Note (Signed)
A1c controlled. Some concern for hypoglycemic events especially with frequent falls, some lower CBGs reported and glipizide use. Will change glipizide to metformin as previously tolerated well. Foot exam completed today. Has eye appt next month. Will obtain prior PCP records. F/u in 1 month.

## 2022-02-20 NOTE — Assessment & Plan Note (Signed)
Unclear etiology, no prior formal diagnosis listed in chart. Improved with regular breztri use and prn albuterol. Obtaining records from prior PCP. F/u next month.

## 2022-02-20 NOTE — Assessment & Plan Note (Signed)
Obtaining records from prior PCP to determine need for ongoing PAPs given h/o TAH.

## 2022-02-20 NOTE — Patient Instructions (Addendum)
It was great to see you!  Our plans for today:  - Take lasix (furosemide) every day in the morning. If you notice your legs are still swollen in the afternoon (at least 6 hours after last dose), take another pill. Don't take at least 3 hours before bed.  - Take metformin in place of the glipizide. - Someone will call you with appointments for your colonoscopy and bone density scan. - Check with your insurance about getting your shingles vaccine. You can also get this at the pharmacy.  We are checking some labs today, we will release these results to your MyChart.  Take care and seek immediate care sooner if you develop any concerns.   Dr. Linwood Dibbles

## 2022-02-20 NOTE — Progress Notes (Signed)
SUBJECTIVE:   CHIEF COMPLAINT / HPI:   Leg edema - seen 6/7 as new patient for acute visit. At that time had LE swelling for 2 weeks. Unclear if she was taking her lasix and had a lot of confusion about her medications. Refilled lasix and obtained labs at that time.  - better - not sure how she is taking lasix. States her daughter in law, Shanda Bumps lays out meds for her.   Hypertension: - Medications: losartan, metoprolol - Compliance: has been out of losartan and metoprolol for months - Denies any SOB, CP, vision changes, medication Ses - does have h/o frequent falls with concern for low BP and multiple sedating meds (see below)  Diabetes, Type 2 - Last A1c 6.0 - Medications: glipizide - Compliance: reports compliance - Checking BG at home: yes, 90-120s. Lowest 50s. - Eye exam: has appt in July - Foot exam: done today - Microalbumin: done today - Statin: yes - PNA vaccine: unclear if previously got, will obtain records. - Denies symptoms of numbness extremities, foot ulcers/trauma  HLD - medications: crestor - compliance: reports compliance - medication SEs: none  Migraines - ajovy, nurtec. Follows with Neuro, last saw 5/19. Next appt 7/19.  Bipolar d/o - follows with Psychiatry, [facility], every 3 months, mainly virtually. On xanax, lamictal, abilify, trazodone, trintellix, caplyta, prazosin. Recently switched from valium to xanax, last week. States symptoms are well controlled.   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 On keppra. States last episode was months ago. - last saw Neuro 5/19, thought frequent falls and spells to be 2/2 multiple sedating medications. Placed urgent home health consult with PT/OT/speech,  nursing, social work. - last fell last week, Thursday or Friday. Falls about once every 2-3 weeks. Hasn't fallen since change from valium to xanax.   OSA - not on CPAP, previously referred for hypoglossal nerve stimulator.  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 Next visit 7/31. - Getting facet injections - Previously on oxycodone, not on currently.  Wheezing - unclear underlying lung diagnosis - taking breztri BID, albuterol prn.   H/o hep C - s/p tx 2002 per patient. Previously saw Dr. Welton Flakes.   Reproductive history - post-menopausal, surgical in 30s. Oophorectomy for dermoid cyst. Also with TAH. Per chart review has h/o cervical cancer.  States she still gets pap smears  Healthcare Maintenance - Vaccines: received 1 J&J vaccine, 2 mRNA boosters.  - Colonoscopy: order placed today. - Mammogram: UTD, done 11/2020 - Pap Smear: s/p TAH - DEXA Scan: ordered today - A1c: UTD - Lipid Panel: UTD   OBJECTIVE:   BP 100/62   Pulse (!) 105   Temp 98.3 F (36.8 C)   Resp 16   Ht 5\' 2"  (1.575 m)   Wt 206 lb 11.2 oz (93.8 kg)   SpO2 90%   BMI 37.81 kg/m   Gen: tired appearing, in NAD Card: RRR Lungs: CTAB, no wheeze/rales Ext: WWP, 1+ pitting edema to shins bilaterally   ASSESSMENT/PLAN:   Essential hypertension, malignant Low today. Will stop losartan and metoprolol. Obtaining labs today.  Esophageal reflux Tolerant of PPI, continue.   Hepatitis C S/p treatment per patient. Obtaining serologies.   Type 2 diabetes mellitus (HCC) A1c  controlled. Some concern for hypoglycemic events especially with frequent falls, some lower CBGs reported and glipizide use. Will change glipizide to metformin as previously tolerated well. Foot exam completed today. Has eye appt next month. Will obtain prior PCP records. F/u in 1 month.   History of CVA (cerebrovascular accident) With encephalomalacia  seen on prior imaging and resultant memory loss complicating care.   Lumbago with sciatica Continue to follow with ortho and PMR. Agree with HHPT as ordered by Neuro. Goal to avoid chronic narcotics given frequent falls and previous substance use d/o.  Kidney stone CT from recent ED visit reviewed with stable stones, no urinary obstruction seen.   Bipolar disorder (HCC) With multiple sedating medications contributing to frequent falls and tired appearance though improved from last week since changing valium to xanax. Will obtain records from psychiatry. Strongly recommend reducing dose of sedating meds as able to control symptoms without contributing to falls.   Falls frequently Previously evaluated by Neuro with multiple ED visits for same. Likely multifactorial and largely iatrogenic. On multiple sedating psychiatric meds though appears improved from last week since change from valium to xanax. Will stop losartan and metoprolol given lower BP. Also changing glipizide to metformin given concern for hypoglycemia. Also concern about medication adherence given patient does not manage her own medication and not sure what she takes, pharmacy consulted for polypharmacy and med adherence. Labs previously obtained by Neuro reviewed. F/u next month.  History of cervical cancer Obtaining records from prior PCP to determine need for ongoing PAPs given h/o TAH.   Hyperlipidemia, unspecified LDL at goal. Tolerant of statin, continue.  Leg swelling Improved though unsure how she is taking lasix. Instructed to take daily with additional pill in afternoon if remains edematous. F/u next month.  Wheezing Unclear etiology, no prior formal diagnosis listed in chart. Improved with regular breztri use and prn albuterol. Obtaining records from prior PCP. F/u next month.     Caro Laroche, DO

## 2022-02-20 NOTE — Assessment & Plan Note (Signed)
Low today. Will stop losartan and metoprolol. Obtaining labs today.

## 2022-02-21 ENCOUNTER — Telehealth: Payer: Self-pay | Admitting: Pharmacist

## 2022-02-21 ENCOUNTER — Ambulatory Visit: Payer: Medicaid Other | Admitting: Family Medicine

## 2022-02-21 NOTE — Telephone Encounter (Signed)
Received referral for medication management support from Dr. Linwood Dibbles. Contacted patient to schedule appointment. Left voicemail for her to return my call at her convenience.   Catie Eppie Gibson, PharmD, [facility] [phone]

## 2022-02-24 LAB — DRUG MONITOR, PANEL 1, W/CONF, URINE
Alphahydroxyalprazolam: 426 ng/mL — ABNORMAL HIGH (ref <25)
Alphahydroxymidazolam: NEGATIVE ng/mL (ref <50)
Alphahydroxytriazolam: NEGATIVE ng/mL (ref <50)
Aminoclonazepam: NEGATIVE ng/mL (ref <25)
Amphetamine: NEGATIVE ng/mL (ref <250)
Amphetamines: NEGATIVE ng/mL (ref <500)
Barbiturates: NEGATIVE ng/mL (ref <300)
Benzodiazepines: POSITIVE ng/mL — AB (ref <100)
Cocaine Metabolite: NEGATIVE ng/mL (ref <150)
Creatinine: 84.9 mg/dL (ref >=20.0)
Hydroxyethylflurazepam: NEGATIVE ng/mL (ref <50)
Lorazepam: NEGATIVE ng/mL (ref <50)
Marijuana Metabolite: NEGATIVE ng/mL (ref <20)
Methadone Metabolite: NEGATIVE ng/mL (ref <100)
Methamphetamine: NEGATIVE ng/mL (ref <250)
Nordiazepam: 724 ng/mL — ABNORMAL HIGH (ref <50)
Opiates: NEGATIVE ng/mL (ref <100)
Oxazepam: 5589 ng/mL — ABNORMAL HIGH (ref <50)
Oxidant: NEGATIVE ug/mL (ref <200)
Oxycodone: NEGATIVE ng/mL (ref <100)
Phencyclidine: NEGATIVE ng/mL (ref <25)
Temazepam: 2824 ng/mL — ABNORMAL HIGH (ref <50)
pH: 6.2 (ref 4.5–9.0)

## 2022-02-24 LAB — TEST AUTHORIZATION: TEST CODE:: 39422

## 2022-02-24 LAB — BASIC METABOLIC PANEL
BUN: 20 mg/dL (ref 7–25)
CO2: 32 mmol/L (ref 20–32)
Calcium: 10.5 mg/dL — ABNORMAL HIGH (ref 8.6–10.4)
Chloride: 103 mmol/L (ref 98–110)
Creat: 0.81 mg/dL (ref 0.50–1.03)
Glucose, Bld: 117 mg/dL — ABNORMAL HIGH (ref 65–99)
Potassium: 4 mmol/L (ref 3.5–5.3)
Sodium: 143 mmol/L (ref 135–146)

## 2022-02-24 LAB — DM TEMPLATE

## 2022-02-24 LAB — MICROALBUMIN / CREATININE URINE RATIO
Creatinine, Urine: 87 mg/dL (ref 20–275)
Microalb Creat Ratio: 23 mcg/mg creat (ref <30)
Microalb, Ur: 2 mg/dL

## 2022-02-24 LAB — VITAMIN B12: Vitamin B-12: 380 pg/mL (ref 200–1100)

## 2022-02-24 LAB — HEPATITIS C RNA QUANTITATIVE
HCV Quantitative Log: <1.18 log IU/mL
HCV RNA, PCR, QN: <15 IU/mL

## 2022-02-25 ENCOUNTER — Encounter: Payer: Self-pay | Admitting: Family Medicine

## 2022-02-25 NOTE — Telephone Encounter (Signed)
Contacted patient, scheduled for appointment on Friday

## 2022-02-27 ENCOUNTER — Ambulatory Visit: Payer: Medicaid Other | Admitting: Urology

## 2022-02-28 DIAGNOSIS — R69 Illness, unspecified: Secondary | ICD-10-CM | POA: Diagnosis not present

## 2022-02-28 DIAGNOSIS — F410 Panic disorder [episodic paroxysmal anxiety] without agoraphobia: Secondary | ICD-10-CM | POA: Diagnosis not present

## 2022-02-28 DIAGNOSIS — F411 Generalized anxiety disorder: Secondary | ICD-10-CM | POA: Diagnosis not present

## 2022-02-28 DIAGNOSIS — F319 Bipolar disorder, unspecified: Secondary | ICD-10-CM | POA: Diagnosis not present

## 2022-02-28 DIAGNOSIS — F4312 Post-traumatic stress disorder, chronic: Secondary | ICD-10-CM | POA: Diagnosis not present

## 2022-03-01 ENCOUNTER — Encounter: Payer: Self-pay | Admitting: Urology

## 2022-03-01 ENCOUNTER — Other Ambulatory Visit: Payer: Self-pay | Admitting: Nurse Practitioner

## 2022-03-01 ENCOUNTER — Other Ambulatory Visit: Payer: Self-pay | Admitting: Pharmacist

## 2022-03-01 DIAGNOSIS — M7989 Other specified soft tissue disorders: Secondary | ICD-10-CM

## 2022-03-01 MED ORDER — FUROSEMIDE 40 MG PO TABS: 40.0000 mg | ORAL_TABLET | Freq: Two times a day (BID) | ORAL | 0 refills | Status: DC | PRN

## 2022-03-09 DIAGNOSIS — Z419 Encounter for procedure for purposes other than remedying health state, unspecified: Secondary | ICD-10-CM | POA: Diagnosis not present

## 2022-03-11 DIAGNOSIS — G4733 Obstructive sleep apnea (adult) (pediatric): Secondary | ICD-10-CM | POA: Diagnosis not present

## 2022-03-11 DIAGNOSIS — Z7689 Persons encountering health services in other specified circumstances: Secondary | ICD-10-CM | POA: Diagnosis not present

## 2022-03-22 ENCOUNTER — Ambulatory Visit: Payer: Medicaid Other | Admitting: Internal Medicine

## 2022-03-28 ENCOUNTER — Ambulatory Visit: Payer: Self-pay

## 2022-03-28 NOTE — Telephone Encounter (Signed)
  Chief Complaint: abdominal Pain Symptoms: abdominal pain - pain shoots to right side of abdomen, diarrhea Frequency: a few months off and on - now constant Pertinent Negatives: Patient denies fever Disposition: [] ED /[x] Urgent Care (no appt availability in office) / [] Appointment(In office/virtual)/ []  [facility]/ [] Home Care/ [] Refused Recommended Disposition /[] [place] [facility]/ []  Follow-up with PCP Additional Notes: Pt has a history of kidney stones. Pt thinks this may be a kidney stone, but unsure. Pain is now constant. Pt also has diarrhea.

## 2022-03-28 NOTE — Telephone Encounter (Signed)
Reason for Disposition  [1] SEVERE pain AND [2] present < 1 hour  Answer Assessment - Initial Assessment Questions 1. LOCATION: "Where does it hurt?"      All over abdomen 2. RADIATION: "Does the pain shoot anywhere else?" (e.g., chest, back)     To the right side 3. ONSET: "When did the pain begin?" (e.g., minutes, hours or days ago)      Off and on for a few months 4. SUDDEN: "Gradual or sudden onset?"     sudden 5. PATTERN "Does the pain come and go, or is it constant?"    - If it comes and goes: "How long does it last?" "Do you have pain now?"     (Note: Comes and goes means the pain is intermittent. It goes away completely between bouts.)    - If constant: "Is it getting better, staying the same, or getting worse?"      (Note: Constant means the pain never goes away completely; most serious pain is constant and gets worse.)      Comes and goes 6. SEVERITY: "How bad is the pain?"  (e.g., Scale 1-10; mild, moderate, or severe)    - MILD (1-3): Doesn't interfere with normal activities, abdomen soft and not tender to touch.     - MODERATE (4-7): Interferes with normal activities or awakens from sleep, abdomen tender to touch.     - SEVERE (8-10): Excruciating pain, doubled over, unable to do any normal activities.       9/10 7. RECURRENT SYMPTOM: "Have you ever had this type of stomach pain before?" If Yes, ask: "When was the last time?" and "What happened that time?"      Kidney stones 8. CAUSE: "What do you think is causing the stomach pain?"     Kidney stones 9. RELIEVING/AGGRAVATING FACTORS: "What makes it better or worse?" (e.g., antacids, bending or twisting motion, bowel movement)     Walking makes it worse 10. OTHER SYMPTOMS: "Do you have any other symptoms?" (e.g., back pain, diarrhea, fever, urination pain, vomiting)       diarrhea 11. PREGNANCY: "Is there any chance you are pregnant?" "When was your last menstrual period?"       na  Protocols used: Abdominal Pain -  [facility]

## 2022-03-28 NOTE — Telephone Encounter (Signed)
Please schedule appointment.

## 2022-03-29 ENCOUNTER — Other Ambulatory Visit: Payer: Self-pay

## 2022-03-29 ENCOUNTER — Encounter: Payer: Self-pay | Admitting: Nurse Practitioner

## 2022-03-29 ENCOUNTER — Ambulatory Visit: Payer: Medicaid Other | Admitting: Nurse Practitioner

## 2022-03-29 VITALS — BP 128/80 | HR 94 | Temp 97.6°F | Resp 14 | Ht 62.0 in | Wt 198.2 lb

## 2022-03-29 DIAGNOSIS — Z87442 Personal history of urinary calculi: Secondary | ICD-10-CM | POA: Diagnosis not present

## 2022-03-29 DIAGNOSIS — N3000 Acute cystitis without hematuria: Secondary | ICD-10-CM | POA: Diagnosis not present

## 2022-03-29 DIAGNOSIS — M7989 Other specified soft tissue disorders: Secondary | ICD-10-CM | POA: Diagnosis not present

## 2022-03-29 DIAGNOSIS — R1084 Generalized abdominal pain: Secondary | ICD-10-CM | POA: Diagnosis not present

## 2022-03-29 DIAGNOSIS — R197 Diarrhea, unspecified: Secondary | ICD-10-CM

## 2022-03-29 LAB — POCT URINALYSIS DIPSTICK
Bilirubin, UA: NEGATIVE
Glucose, UA: NEGATIVE
Ketones, UA: NEGATIVE
Nitrite, UA: NEGATIVE
Odor: NORMAL
Protein, UA: POSITIVE — AB
Spec Grav, UA: 1.025 (ref 1.010–1.025)
Urobilinogen, UA: 0.2 E.U./dL
pH, UA: 6 (ref 5.0–8.0)

## 2022-03-29 MED ORDER — NITROFURANTOIN MONOHYD MACRO 100 MG PO CAPS: 100.0000 mg | ORAL_CAPSULE | Freq: Two times a day (BID) | ORAL | 0 refills | Status: DC

## 2022-03-29 MED ORDER — FUROSEMIDE 40 MG PO TABS: 40.0000 mg | ORAL_TABLET | Freq: Two times a day (BID) | ORAL | 0 refills | Status: DC | PRN

## 2022-03-29 NOTE — Progress Notes (Signed)
BP 128/80   Pulse 94   Temp 97.6 F (36.4 C) (Oral)   Resp 14   Ht 5\' 2"  (1.575 m)   Wt 198 lb 3.2 oz (89.9 kg)   SpO2 96%   BMI 36.25 kg/m    Subjective:    [name]: , female    DOB: 10-Dec-1965, 56 y.o.   MRN: [id number]  HPI: Stephanie [name] is a 56 y.o. female  Chief Complaint  Patient presents with   Abdominal Pain   Abdominal pain: She says for about a month and a half she has had abdominal pain and diarrhea.  Patient describes the pain is kind of all over.  Patient has a hard time describing it she says it is kind of a crampy sharp feeling.  Patient reports that it kind of comes and goes.  Says nothing makes it worse and nothing makes it better.  She denies any fever, nausea or vomiting.  She was seen in the emergency department on 02/17/2022 and diagnosed with acute pyelonephritis.  CT scan did show that she did have some kidney stones in her kidneys.  Will get urine sample, stool sample and labs.  Urine sample came back with positive protein and large leukocytes.  We will send in Macrobid.  Instructed to push fluids.  Urine sent for culture. Scheduled for colonoscopy on 04/25/2022.   History of kidney stones: Was seen in the emergency department on 02/17/2022.  CT abdomen pelvis showed No adrenal mass. Bilateral renal calcifications more numerous right than left. Largest calcification on the right peripherally in the upper pole 6 mm, on the left in the upper pole 5 mm. Lobular right renal contour as before. No hydronephrosis. Urinary bladder physiologically distended. referral to urology placed.   Relevant past medical, surgical, family and social history reviewed and updated as indicated. Interim medical history since our last visit reviewed. Allergies and medications reviewed and updated.  Review of Systems  Constitutional: Negative for fever or weight change.  Respiratory: Negative for cough and shortness of breath.   Cardiovascular: Negative for  chest pain or palpitations.  Gastrointestinal: Positive for abdominal pain, diarrhea Musculoskeletal: Negative for gait problem or joint swelling.  Skin: Negative for rash.  Neurological: Negative for dizziness or headache.  No other specific complaints in a complete review of systems (except as listed in HPI above).      Objective:    BP 128/80   Pulse 94   Temp 97.6 F (36.4 C) (Oral)   Resp 14   Ht 5\' 2"  (1.575 m)   Wt 198 lb 3.2 oz (89.9 kg)   SpO2 96%   BMI 36.25 kg/m   Wt Readings from Last 3 Encounters:  03/29/22 198 lb 3.2 oz (89.9 kg)  02/20/22 206 lb 11.2 oz (93.8 kg)  02/13/22 215 lb (97.5 kg)    Physical Exam  Constitutional: Patient appears well-developed and well-nourished. Obese  No distress.  HEENT: head atraumatic, normocephalic, pupils equal and reactive to light, neck supple Cardiovascular: Normal rate, regular rhythm and normal heart sounds.  No murmur heard. No BLE edema. Pulmonary/Chest: Effort normal and breath sounds normal. No respiratory distress. Abdominal: Soft.  Generalized tenderness all over.  No CVA tenderness.  Bowel sounds present Psychiatric: Patient has a normal mood and affect. behavior is normal. Judgment and thought content normal.  Results for orders placed or performed in visit on 03/29/22  POCT urinalysis dipstick  Result Value Ref Range   Color, UA  yellow    Clarity, UA cloudy    Glucose, UA Negative Negative   Bilirubin, UA neg    Ketones, UA neg    Spec Grav, UA 1.025 1.010 - 1.025   Blood, UA moderate    pH, UA 6.0 5.0 - 8.0   Protein, UA Positive (A) Negative   Urobilinogen, UA 0.2 0.2 or 1.0 E.U./dL   Nitrite, UA neg    Leukocytes, UA Large (3+) (A) Negative   Appearance clody    Odor normal       Assessment & Plan:   Problem List Items Addressed This Visit       Genitourinary   Kidney stone    Referral placed to urology.        Other   Leg swelling    reports she needs a refill on her Lasix for her leg  swelling.  Refill sent of Lasix 40 mg.      Relevant Medications   furosemide (LASIX) 40 MG tablet   Other Visit Diagnoses     Generalized abdominal pain    -  Primary   Getting labs, urine dip and culture, stool culture.  Patient is scheduled for a colonoscopy.   Relevant Orders   Ambulatory referral to Urology   POCT urinalysis dipstick (Completed)   Urine Culture   CBC with Differential/Platelet   COMPLETE METABOLIC PANEL WITH GFR   Diarrhea, unspecified type       Getting a stool culture.  Discussed with patient she should stick to a brat diet and push fluids.   Relevant Orders   Giardia Antigen (Stool)   Salmonella/Shigella Cult, Campy EIA and Shiga Toxin reflex   Clostridium difficile culture-fecal   Ova and parasite examination   Acute cystitis without hematuria       Urine dip did show positive leukocytes and protein.  We will start Macrobid.  Urine sent for culture.   Relevant Medications   nitrofurantoin, macrocrystal-monohydrate, (MACROBID) 100 MG capsule        Follow up plan: Return in 3 months (on 06/29/2022) for follow up, with Dr Caralee Ates.

## 2022-03-29 NOTE — Assessment & Plan Note (Addendum)
reports she needs a refill on her Lasix for her leg swelling.  Refill sent of Lasix 40 mg.

## 2022-03-29 NOTE — Assessment & Plan Note (Signed)
Referral placed to urology.

## 2022-04-01 LAB — CBC WITH DIFFERENTIAL/PLATELET
Absolute Monocytes: 389 cells/uL (ref 200–950)
Basophils Absolute: 22 cells/uL (ref 0–200)
Basophils Relative: 0.4 %
Eosinophils Absolute: 32 cells/uL (ref 15–500)
Eosinophils Relative: 0.6 %
HCT: 43.7 % (ref 35.0–45.0)
Hemoglobin: 14.6 g/dL (ref 11.7–15.5)
Lymphs Abs: 1966 cells/uL (ref 850–3900)
MCH: 30.5 pg (ref 27.0–33.0)
MCHC: 33.4 g/dL (ref 32.0–36.0)
MCV: 91.4 fL (ref 80.0–100.0)
MPV: 11.1 fL (ref 7.5–12.5)
Monocytes Relative: 7.2 %
Neutro Abs: 2992 cells/uL (ref 1500–7800)
Neutrophils Relative %: 55.4 %
Platelets: 169 10*3/uL (ref 140–400)
RBC: 4.78 10*6/uL (ref 3.80–5.10)
RDW: 12.8 % (ref 11.0–15.0)
Total Lymphocyte: 36.4 %
WBC: 5.4 10*3/uL (ref 3.8–10.8)

## 2022-04-01 LAB — URINE CULTURE
MICRO NUMBER:: 13680315
SPECIMEN QUALITY:: ADEQUATE

## 2022-04-01 LAB — COMPLETE METABOLIC PANEL WITH GFR
AG Ratio: 1.6 (calc) (ref 1.0–2.5)
ALT: 23 U/L (ref 6–29)
AST: 20 U/L (ref 10–35)
Albumin: 4.7 g/dL (ref 3.6–5.1)
Alkaline phosphatase (APISO): 117 U/L (ref 37–153)
BUN: 18 mg/dL (ref 7–25)
CO2: 34 mmol/L — ABNORMAL HIGH (ref 20–32)
Calcium: 10.3 mg/dL (ref 8.6–10.4)
Chloride: 97 mmol/L — ABNORMAL LOW (ref 98–110)
Creat: 0.82 mg/dL (ref 0.50–1.03)
Globulin: 3 g/dL (calc) (ref 1.9–3.7)
Glucose, Bld: 152 mg/dL — ABNORMAL HIGH (ref 65–99)
Potassium: 3.8 mmol/L (ref 3.5–5.3)
Sodium: 143 mmol/L (ref 135–146)
Total Bilirubin: 0.4 mg/dL (ref 0.2–1.2)
Total Protein: 7.7 g/dL (ref 6.1–8.1)
eGFR: 83 mL/min/{1.73_m2} (ref >=60)

## 2022-04-03 DIAGNOSIS — R197 Diarrhea, unspecified: Secondary | ICD-10-CM | POA: Diagnosis not present

## 2022-04-06 ENCOUNTER — Emergency Department (HOSPITAL_COMMUNITY): Payer: Medicaid Other

## 2022-04-06 ENCOUNTER — Encounter (HOSPITAL_COMMUNITY): Payer: Self-pay

## 2022-04-06 ENCOUNTER — Other Ambulatory Visit: Payer: Self-pay

## 2022-04-06 ENCOUNTER — Emergency Department (HOSPITAL_COMMUNITY)
Admission: EM | Admit: 2022-04-06 | Discharge: 2022-04-06 | Disposition: A | Discharge status: Home / Self Care | Payer: Medicaid Other | Attending: Emergency Medicine | Admitting: Emergency Medicine

## 2022-04-06 DIAGNOSIS — R197 Diarrhea, unspecified: Secondary | ICD-10-CM | POA: Diagnosis not present

## 2022-04-06 DIAGNOSIS — R109 Unspecified abdominal pain: Secondary | ICD-10-CM

## 2022-04-06 DIAGNOSIS — K760 Fatty (change of) liver, not elsewhere classified: Secondary | ICD-10-CM | POA: Diagnosis not present

## 2022-04-06 DIAGNOSIS — N390 Urinary tract infection, site not specified: Secondary | ICD-10-CM | POA: Diagnosis not present

## 2022-04-06 DIAGNOSIS — N200 Calculus of kidney: Secondary | ICD-10-CM | POA: Diagnosis not present

## 2022-04-06 DIAGNOSIS — N261 Atrophy of kidney (terminal): Secondary | ICD-10-CM | POA: Diagnosis not present

## 2022-04-06 DIAGNOSIS — K5730 Diverticulosis of large intestine without perforation or abscess without bleeding: Secondary | ICD-10-CM | POA: Diagnosis not present

## 2022-04-06 DIAGNOSIS — I10 Essential (primary) hypertension: Secondary | ICD-10-CM | POA: Diagnosis not present

## 2022-04-06 DIAGNOSIS — Z7902 Long term (current) use of antithrombotics/antiplatelets: Secondary | ICD-10-CM | POA: Insufficient documentation

## 2022-04-06 LAB — CBC
HCT: 43.6 % (ref 36.0–46.0)
Hemoglobin: 14.3 g/dL (ref 12.0–15.0)
MCH: 30.6 pg (ref 26.0–34.0)
MCHC: 32.8 g/dL (ref 30.0–36.0)
MCV: 93.2 fL (ref 80.0–100.0)
Platelets: 167 10*3/uL (ref 150–400)
RBC: 4.68 MIL/uL (ref 3.87–5.11)
RDW: 13.2 % (ref 11.5–15.5)
WBC: 6 10*3/uL (ref 4.0–10.5)
nRBC: 0 % (ref 0.0–0.2)

## 2022-04-06 LAB — COMPREHENSIVE METABOLIC PANEL
ALT: 33 U/L (ref 0–44)
AST: 25 U/L (ref 15–41)
Albumin: 4 g/dL (ref 3.5–5.0)
Alkaline Phosphatase: 98 U/L (ref 38–126)
Anion gap: 11 (ref 5–15)
BUN: 15 mg/dL (ref 6–20)
CO2: 31 mmol/L (ref 22–32)
Calcium: 10.2 mg/dL (ref 8.9–10.3)
Chloride: 99 mmol/L (ref 98–111)
Creatinine, Ser: 0.8 mg/dL (ref 0.44–1.00)
GFR, Estimated: >60 mL/min (ref >60)
Glucose, Bld: 179 mg/dL — ABNORMAL HIGH (ref 70–99)
Potassium: 3.4 mmol/L — ABNORMAL LOW (ref 3.5–5.1)
Sodium: 141 mmol/L (ref 135–145)
Total Bilirubin: 0.3 mg/dL (ref 0.3–1.2)
Total Protein: 7.5 g/dL (ref 6.5–8.1)

## 2022-04-06 LAB — URINALYSIS, ROUTINE W REFLEX MICROSCOPIC
Bilirubin Urine: NEGATIVE
Glucose, UA: NEGATIVE mg/dL
Ketones, ur: NEGATIVE mg/dL
Nitrite: NEGATIVE
Protein, ur: NEGATIVE mg/dL
Specific Gravity, Urine: 1.013 (ref 1.005–1.030)
WBC, UA: >50 WBC/hpf — ABNORMAL HIGH (ref 0–5)
pH: 5 (ref 5.0–8.0)

## 2022-04-06 LAB — LIPASE, BLOOD: Lipase: 37 U/L (ref 11–51)

## 2022-04-06 LAB — I-STAT BETA HCG BLOOD, ED (MC, WL, AP ONLY): I-stat hCG, quantitative: 5.3 m[IU]/mL — ABNORMAL HIGH (ref <5)

## 2022-04-06 MED ORDER — CEFDINIR 300 MG PO CAPS: 300.0000 mg | ORAL_CAPSULE | Freq: Two times a day (BID) | ORAL | 0 refills | Status: DC

## 2022-04-06 MED ORDER — SODIUM CHLORIDE 0.9 % IV SOLN
1.0000 g | Freq: Once | INTRAVENOUS | Status: AC
  Administered 2022-04-06: 1 g via INTRAVENOUS
  Filled 2022-04-06: qty 10

## 2022-04-06 MED ORDER — FENTANYL CITRATE PF 50 MCG/ML IJ SOSY
50.0000 ug | PREFILLED_SYRINGE | Freq: Once | INTRAMUSCULAR | Status: AC
  Administered 2022-04-06: 50 ug via INTRAVENOUS
  Filled 2022-04-06: qty 1

## 2022-04-06 MED ORDER — SODIUM CHLORIDE 0.9 % IV BOLUS
1000.0000 mL | Freq: Once | INTRAVENOUS | Status: AC
  Administered 2022-04-06: 1000 mL via INTRAVENOUS

## 2022-04-06 NOTE — ED Triage Notes (Signed)
Pt reports bilateral flank pain and lower abdominal pain x1 week. Pt endorses nausea. Pt has hx of known kidney stones.

## 2022-04-06 NOTE — ED Provider Triage Note (Signed)
Emergency Medicine Provider Triage Evaluation Note  Stephanie [name] , a 56 y.o. female  was evaluated in triage.  Pt complains of diarrhea for 1 week and lower back pain over the past couple of days.  She has a history of kidney stones and was concerned that stones were causing her pain.  No fevers or vomiting.  No urinary symptoms.  Pain is becoming more severe.  Review of Systems  Positive: Back pain Negative: Fever  Physical Exam  BP (!) 141/93 (BP Location: Left Arm)   Pulse 98   Temp 98.2 F (36.8 C) (Oral)   Resp 18   SpO2 98%  Gen:   Awake, no distress   Resp:  Normal effort  MSK:   Moves extremities without difficulty  Other:    Medical Decision Making  Medically screening exam initiated at 9:37 AM.  Appropriate orders placed.  Stephanie [name] was informed that the remainder of the evaluation will be completed by another provider, this initial triage assessment does not replace that evaluation, and the importance of remaining in the ED until their evaluation is complete.     Renne Crigler, PA-C 04/06/22 [phone]

## 2022-04-06 NOTE — ED Notes (Signed)
Called lab to order urine culture.

## 2022-04-06 NOTE — ED Provider Notes (Signed)
[facility]-EMERGENCY DEPT Provider Note   CSN: [id number] Arrival date & time: 04/06/22  9983     History  Chief Complaint  Patient presents with   Abdominal Pain   Flank Pain    Stephanie [name] is a 56 y.o. female.  Patient presents to the ED with diarrhea for 1 week and lower back pain over the past couple of days.  She has a history of kidney stones and was concerned that stones were causing her pain.  No fevers or vomiting.  No urinary symptoms.  Pain is becoming more severe.  She does have a history of urinary tract infections.  She was seen and treated in the emergency department for pyelonephritis in June 2023.  She has recently seen by PCP on 03/29/2022 per note: "She says for about a month and a half she has had abdominal pain and diarrhea.  Patient describes the pain is kind of all over.  Patient has a hard time describing it she says it is kind of a crampy sharp feeling."  Urine once again showed signs of infection and patient was prescribed Macrobid.  She also had stool studies done at that appointment.  Upcoming follow-up with GI for colonoscopy.       Home Medications Prior to Admission medications   Medication Sig Start Date End Date Taking? Authorizing Provider  albuterol (VENTOLIN HFA) 108 (90 Base) MCG/ACT inhaler Inhale 2 puffs into the lungs every 6 (six) hours as needed for wheezing or shortness of breath. 02/13/22   Caro Laroche, DO  ALPRAZolam Prudy Feeler) 1 MG tablet SMARTSIG:1 Tablet(s) By Mouth 1-2 Times Daily 02/13/22   [provider]  ARIPiprazole (ABILIFY) 15 MG tablet Take 15 mg by mouth every morning. 06/10/21   [provider]  BREZTRI AEROSPHERE 160-9-4.8 MCG/ACT AERO Inhale 2 puffs into the lungs 2 (two) times daily. 02/20/22   Caro Laroche, DO  CAPLYTA 42 MG capsule Take 42 mg by mouth every evening. 06/08/21   [provider]  cefpodoxime (VANTIN) 200 MG tablet Take 200 mg by mouth 2 (two) times  daily.    [provider]  cholecalciferol (VITAMIN D3) 25 MCG (1000 UT) tablet Take 1,000 Units by mouth daily.    [provider]  clopidogrel (PLAVIX) 75 MG tablet Take 75 mg by mouth daily. 02/25/22   [provider]  Continuous Blood Gluc Sensor (FREESTYLE LIBRE 2 SENSOR) MISC USE WITH READER TO CHECK SUGARS DAILY 02/01/22   [provider]  Cyanocobalamin (VITAMIN B-12 PO) Take 1 tablet by mouth daily.    [provider]  furosemide (LASIX) 40 MG tablet Take 1 tablet (40 mg total) by mouth 2 (two) times daily as needed for edema (take 40 mg in the morning, can take additional 40 mg in afternoon if needed for edema). 03/29/22   Berniece Salines, FNP  KLOR-CON M10 10 MEQ tablet Take 10 mEq by mouth daily as needed (when taking furosemide). 01/23/21   [provider]  lamoTRIgine (LAMICTAL) 100 MG tablet Take 1 tablet (100 mg total) by mouth 2 (two) times daily. 06/20/20   York Spaniel, MD  levETIRAcetam (KEPPRA) 250 MG tablet Take 250 mg by mouth 2 (two) times daily. 01/15/22   [provider]  metFORMIN (GLUCOPHAGE) 500 MG tablet Take 1 tablet (500 mg total) by mouth 2 (two) times daily with a meal. 02/20/22   Rumball, Darl Householder, DO  nitrofurantoin, macrocrystal-monohydrate, (MACROBID) 100 MG capsule Take  1 capsule (100 mg total) by mouth 2 (two) times daily. 03/29/22   Berniece Salines, FNP  omeprazole (PRILOSEC) 40 MG capsule Take 40 mg by mouth daily. 08/13/19   [provider]  prazosin (MINIPRESS) 2 MG capsule Take 4 mg by mouth every evening. 02/15/21   [provider]  rosuvastatin (CRESTOR) 20 MG tablet Take 20 mg by mouth daily. 12/29/21   [provider]  traZODone (DESYREL) 150 MG tablet Take 150 mg by mouth at bedtime as needed for sleep.  05/30/20   [provider]  TRINTELLIX 20 MG TABS tablet Take 20 mg by mouth daily. 01/22/21   [provider]      Allergies    Gabapentin,  Ibuprofen, Sulfa antibiotics, Tylenol [acetaminophen], Aspirin, Buprenorphine hcl, Carbamazepine, Codeine, Compazine, Elemental sulfur, Ioxaglate, Ivp dye [iodinated contrast media], Metrizamide, Naproxen, Norco [hydrocodone-acetaminophen], Other, Penicillin g, Prochlorperazine maleate, Reglan [metoclopramide], Toradol [ketorolac tromethamine], Tramadol, and Zofran [ondansetron hcl]    Review of Systems   Review of Systems  Physical Exam Updated Vital Signs BP 126/75 (BP Location: Left Arm)   Pulse 95   Temp 98 F (36.7 C) (Oral)   Resp 18   SpO2 95%   Physical Exam Vitals and nursing note reviewed.  Constitutional:      General: She is not in acute distress.    Appearance: She is well-developed.  HENT:     Head: Normocephalic and atraumatic.     Right Ear: External ear normal.     Left Ear: External ear normal.     Nose: Nose normal.  Eyes:     Conjunctiva/sclera: Conjunctivae normal.  Cardiovascular:     Rate and Rhythm: Normal rate and regular rhythm.     Heart sounds: No murmur heard. Pulmonary:     Effort: No respiratory distress.     Breath sounds: No wheezing, rhonchi or rales.  Abdominal:     Palpations: Abdomen is soft.     Tenderness: There is generalized abdominal tenderness. There is no guarding or rebound.  Musculoskeletal:     Cervical back: Normal range of motion and neck supple.     Right lower leg: No edema.     Left lower leg: No edema.  Skin:    General: Skin is warm and dry.     Findings: No rash.  Neurological:     General: No focal deficit present.     Mental Status: She is alert. Mental status is at baseline.     Motor: No weakness.  Psychiatric:        Mood and Affect: Mood normal.     ED Results / Procedures / Treatments   Labs (all labs ordered are listed, but only abnormal results are displayed) Labs Reviewed  COMPREHENSIVE METABOLIC PANEL - Abnormal; Notable for the following components:      Result Value   Potassium 3.4 (*)     Glucose, Bld 179 (*)    All other components within normal limits  URINALYSIS, ROUTINE W REFLEX MICROSCOPIC - Abnormal; Notable for the following components:   APPearance CLOUDY (*)    Hgb urine dipstick SMALL (*)    Leukocytes,Ua LARGE (*)    WBC, UA >50 (*)    Bacteria, UA MANY (*)    All other components within normal limits  I-STAT BETA HCG BLOOD, ED ([facility], [facility], [facility] ONLY) - Abnormal; Notable for the following components:   I-stat hCG, quantitative 5.3 (*)    All other components within normal  limits  URINE CULTURE  LIPASE, BLOOD  CBC    EKG None  Radiology CT ABDOMEN PELVIS WO CONTRAST  Result Date: 04/06/2022 CLINICAL DATA:  56 year old female with history of abdominal pain and back pain. EXAM: CT ABDOMEN AND PELVIS WITHOUT CONTRAST TECHNIQUE: Multidetector CT imaging of the abdomen and pelvis was performed following the standard protocol without IV contrast. RADIATION DOSE REDUCTION: This exam was performed according to the departmental dose-optimization program which includes automated exposure control, adjustment of the mA and/or kV according to patient size and/or use of iterative reconstruction technique. COMPARISON:  CT the abdomen and pelvis 02/17/2022. FINDINGS: Lower chest: Atherosclerotic calcifications in the descending thoracic aorta. Hepatobiliary: Diffuse low attenuation throughout the hepatic parenchyma, indicative of a background of hepatic steatosis. No suspicious cystic or solid hepatic lesions are confidently identified on today's noncontrast CT examination. Gallbladder is nearly completely decompressed, but otherwise unremarkable in appearance. Pancreas: No definite pancreatic mass or peripancreatic fluid collections or inflammatory changes. Spleen: Unremarkable. Adrenals/Urinary Tract: Multiple nonobstructive calculi are noted within the collecting systems of both kidneys measuring up to 5 mm in the upper pole collecting system of the left kidney. Mild right renal atrophy  with multifocal cortical thinning, presumably areas of chronic post infectious or inflammatory scarring. No definite suspicious renal lesions confidently identified on today's noncontrast CT examination. No calcifications are identified along the course of either ureter or within the lumen of the urinary bladder. No hydroureteronephrosis. Urinary bladder is nearly completely decompressed, but otherwise unremarkable in appearance. Bilateral adrenal glands are normal in appearance. Stomach/Bowel: Unenhanced appearance of the stomach is normal. There is no pathologic dilatation of small bowel or colon. Numerous colonic diverticulae are noted, particularly in the sigmoid colon, without surrounding inflammatory changes to suggest an acute diverticulitis at this time. Normal appendix. Vascular/Lymphatic: Atherosclerosis in the abdominal aorta and pelvic vasculature. No lymphadenopathy noted in the abdomen or pelvis. Reproductive: Status post hysterectomy. Ovaries are not confidently identified may be surgically absent or atrophic. Other: No significant volume of ascites.  No pneumoperitoneum. Musculoskeletal: There are no aggressive appearing lytic or blastic lesions noted in the visualized portions of the skeleton. IMPRESSION: 1. Multiple nonobstructive calculi are noted within the collecting systems of both kidneys. No ureteral stones or findings of urinary tract obstruction are noted at this time. 2. Hepatic steatosis. 3. Colonic diverticulosis without evidence of acute diverticulitis at this time. 4. Aortic atherosclerosis. Electronically Signed   By: Trudie Reed M.D.   On: 04/06/2022 10:16    Procedures Procedures    Medications Ordered in ED Medications  cefTRIAXone (ROCEPHIN) 1 g in sodium chloride 0.9 % 100 mL IVPB (has no administration in time range)  sodium chloride 0.9 % bolus 1,000 mL (has no administration in time range)  fentaNYL (SUBLIMAZE) injection 50 mcg (has no administration in time  range)    ED Course/ Medical Decision Making/ A&P    Patient seen and examined. History obtained directly from patient. Also reviewed recent PCP notes and treatment for UTI, ED notes.  Labs/EKG: Personally reviewed and interpreted.  CBC unremarkable; CMP with glucose 179, potassium 3.4 otherwise unremarkable; lipase normal.  UA with greater than 50 white blood cells, white blood cell clumps, large leukocyte esterase.  Added urine culture.  Recent urine culture grew Citrobacter freundii and raoultella ornithinolytica --both sensitive to Macrobid, however patient with UA that does not appear to be improved.   Imaging: Personally reviewed and interpreted CT imaging, agree negative without acute findings.  Medications/Fluids: Ordered: Dose of  IV Rocephin, IV fluid bolus, IV fentanyl 50 mcg for pain.  Most recent vital signs reviewed and are as follows: BP 126/75 (BP Location: Left Arm)   Pulse 95   Temp 98 F (36.7 C) (Oral)   Resp 18   SpO2 95%   Initial impression: Abdominal pain, continued UA which is consistent with infection, not improved with recent treatment with Macrobid.  Anticipate discharge to home after treatment.  2:20 PM   Most current vital signs reviewed and are as follows: BP (!) 133/96 (BP Location: Left Arm)   Pulse 72   Temp 98 F (36.7 C) (Oral)   Resp 18   SpO2 97%   Plan: Discharge to home.   Prescriptions written for: Cefdinir  ED return instructions discussed: The patient was urged to return to the Emergency Department immediately with worsening of current symptoms, worsening abdominal pain, persistent vomiting, blood noted in stools, fever, or any other concerns. The patient verbalized understanding.   Follow-up instructions discussed: Patient encouraged to follow-up with their PCP in 3 days.                            Medical Decision Making Amount and/or Complexity of Data Reviewed Labs: ordered. Radiology: ordered.  Risk Prescription drug  management.   For this patient's complaint of abdominal pain, the following conditions were considered on the differential diagnosis: gastritis/PUD, enteritis/duodenitis, appendicitis, cholelithiasis/cholecystitis, cholangitis, pancreatitis, ruptured viscus, colitis, diverticulitis, small/large bowel obstruction, proctitis, cystitis, pyelonephritis, ureteral colic, aortic dissection, aortic aneurysm. In women, ectopic pregnancy, pelvic inflammatory disease, ovarian cysts, and tubo-ovarian abscess were also considered. Atypical chest etiologies were also considered including ACS, PE, and pneumonia.   The patient's vital signs, pertinent lab work and imaging were reviewed and interpreted as discussed in the ED course. Hospitalization was considered for further testing, treatments, or serial exams/observation. However as patient is well-appearing, has a stable exam, and reassuring studies today, I do not feel that they warrant admission at this time. This plan was discussed with the patient who verbalizes agreement and comfort with this plan and seems reliable and able to return to the Emergency Department with worsening or changing symptoms.           Final Clinical Impression(s) / ED Diagnoses Final diagnoses:  Flank pain  Urinary tract infection without hematuria, site unspecified    Rx / DC Orders ED Discharge Orders          Ordered    cefdinir (OMNICEF) 300 MG capsule  2 times daily        04/06/22 1420              Renne Crigler, PA-C 04/06/22 1421    Terald Sleeper, MD 04/06/22 1528

## 2022-04-06 NOTE — Discharge Instructions (Addendum)
Please read and follow all provided instructions.  Your diagnoses today include:  1. Flank pain   2. Urinary tract infection without hematuria, site unspecified     Tests performed today include: Blood cell counts and platelets: Normal white blood cell count and red blood cell count Kidney and liver function tests: Blood sugar was slightly high Pancreas function test (called lipase) Urine test to look for infection: Suggests infection Urine culture: Pending CT scan of the abdomen pelvis: No acute findings which would explain your symptoms today Vital signs. See below for your results today.   Medications prescribed:  Cefdinir: antibiotic for urine infection  Take any prescribed medications only as directed.  Home care instructions:  Follow any educational materials contained in this packet.  Follow-up instructions: Please follow-up with your primary care provider in the next 3 days for further evaluation of your symptoms.    Return instructions:  SEEK IMMEDIATE MEDICAL ATTENTION IF: The pain does not go away or becomes severe  A temperature above 101F develops  Repeated vomiting occurs (multiple episodes)  The pain becomes localized to portions of the abdomen. The right side could possibly be appendicitis. In an adult, the left lower portion of the abdomen could be colitis or diverticulitis.  Blood is being passed in stools or vomit (bright red or black tarry stools)  You develop chest pain, difficulty breathing, dizziness or fainting, or become confused, poorly responsive, or inconsolable (young children) If you have any other emergent concerns regarding your health  Additional Information: Abdominal (belly) pain can be caused by many things. Your caregiver performed an examination and possibly ordered blood/urine tests and imaging (CT scan, x-rays, ultrasound). Many cases can be observed and treated at home after initial evaluation in the emergency department. Even though you  are being discharged home, abdominal pain can be unpredictable. Therefore, you need a repeated exam if your pain does not resolve, returns, or worsens. Most patients with abdominal pain don't have to be admitted to the hospital or have surgery, but serious problems like appendicitis and gallbladder attacks can start out as nonspecific pain. Many abdominal conditions cannot be diagnosed in one visit, so follow-up evaluations are very important.  Your vital signs today were: BP (!) 133/96   Pulse 87   Temp 98 F (36.7 C) (Oral)   Resp (!) 25   SpO2 93%  If your blood pressure (bp) was elevated above 135/85 this visit, please have this repeated by your doctor within one month. --------------

## 2022-04-07 LAB — URINE CULTURE

## 2022-04-08 ENCOUNTER — Ambulatory Visit: Payer: Self-pay | Admitting: *Deleted

## 2022-04-08 DIAGNOSIS — Z7689 Persons encountering health services in other specified circumstances: Secondary | ICD-10-CM | POA: Diagnosis not present

## 2022-04-08 NOTE — Telephone Encounter (Signed)
Answer Assessment - Initial Assessment Questions 1. MAIN CONCERN OR SYMPTOM: "What's your main concern?" (e.g., low oxygen level, breathing difficulty) "What question do you have?"     Hard to breath 2.  OXYGEN EQUIPMENT:  Are you having trouble with your oxygen equipment?  (e.g., cannula, mask, tubing, tank, concentrator)     No O2 3. ONSET: "When did the  trouble breathing  start?"      Thursday 4. OXYGEN THERAPY:    - "Do you currently use home oxygen?" (e.g., yes, no).    - If Yes, ask: "What is your oxygen source?" (e.g., O2 tank, O2 concentrator).    - If Yes, ask: "How do you get the oxygen?" (e.g., nasal prongs, face mask).    - If Yes, ask: "How much oxygen are you supposed to use?" (e.g., 1-2 L [place])     no 5. PULSE OXIMETER:    - "Do you have a pulse oximeter (pulse ox)?"  (e.g., yes, no)    - If Yes, ask: "Where do you place the probe?" (e.g., fingertip, ear lobe)     no 6. O2 MONITORING: "What is the oxygen level (pulse ox reading)?" (e.g., 70-100%)     no 7. VSS MONITORING: "Do you monitor/measure your oxygen level or vital signs (e.g., yes, no, measurements are automatically sent to provider/call center). Document CURRENT and NORMAL BASELINE values if available.     -  P: "What is your pulse rate per minute?"   -  RR: "What is your respiratory rate per minute?"       8. BREATHING DIFFICULTY: "Are you having any difficulty breathing?" If Yes, ask: "How bad is it?"  (e.g., none, mild, moderate, severe)   - MILD: No SOB at rest, mild SOB with walking, speaks normally in sentences, able to lie down, no retractions, pulse < 100.   - MODERATE: SOB at rest, SOB with minimal exertion and prefers to sit, cannot lie down flat, speaks in phrases, mild retractions, audible wheezing, pulse 100-120.   - SEVERE: Very SOB at rest, speaks in single words, struggling to breathe, sitting hunched forward, retractions, pulse > 120      Mild/moderate 9. OTHER SYMPTOMS: "Do you have any other  symptoms?" (e.g., fever, change in sputum)     UTI treatment 10. SMOKING: "Do you smoke currently?" "Is there anyone that smokes around you?"  (Note: smoking around oxygen is dangerous!)       no  Protocols used: COPD Oxygen Monitoring and Hypoxia-A-AH

## 2022-04-08 NOTE — Telephone Encounter (Signed)
  Chief Complaint: SOB- does not have inhaler Symptoms: SOB with exertion Frequency: several days Pertinent Negatives: Patient denies   Disposition: [] ED /[x] Urgent Care (no appt availability in office) / [] Appointment(In office/virtual)/ []  [place] [facility]/ [] Home Care/ [] Refused Recommended Disposition /[] [place] [facility]/ []  Follow-up with PCP Additional Notes: Patient states she is unable to get [facility]- per Rx should be at pharmacy . Patient unable to measure O2 sat- does admit SOB is worse and needs medication. Advised I will call pharmacy when they open and let her know if she can pick up Rx- in meantime UC advised for her increased SOB without medication.  Reason for Disposition  [1] MILD difficulty breathing (e.g., minimal/no SOB at rest, SOB with walking) AND [2] worse than normal  Protocols used: COPD Oxygen Monitoring and Hypoxia-A-AH

## 2022-04-08 NOTE — Telephone Encounter (Signed)
Call to pharmacy: Plan limitation exceeded message- needs prior authorization. Patient may need alternative Rx.

## 2022-04-08 NOTE — Telephone Encounter (Signed)
Pt notified, sample left up front

## 2022-04-09 ENCOUNTER — Telehealth (INDEPENDENT_AMBULATORY_CARE_PROVIDER_SITE_OTHER): Payer: Medicaid Other | Admitting: Family Medicine

## 2022-04-09 ENCOUNTER — Encounter: Payer: Self-pay | Admitting: Family Medicine

## 2022-04-09 ENCOUNTER — Ambulatory Visit: Payer: Self-pay

## 2022-04-09 VITALS — BP 104/68 | HR 93

## 2022-04-09 DIAGNOSIS — E119 Type 2 diabetes mellitus without complications: Secondary | ICD-10-CM | POA: Diagnosis not present

## 2022-04-09 DIAGNOSIS — R739 Hyperglycemia, unspecified: Secondary | ICD-10-CM | POA: Diagnosis not present

## 2022-04-09 DIAGNOSIS — N3000 Acute cystitis without hematuria: Secondary | ICD-10-CM

## 2022-04-09 DIAGNOSIS — Z7984 Long term (current) use of oral hypoglycemic drugs: Secondary | ICD-10-CM

## 2022-04-09 DIAGNOSIS — Z419 Encounter for procedure for purposes other than remedying health state, unspecified: Secondary | ICD-10-CM | POA: Diagnosis not present

## 2022-04-09 DIAGNOSIS — Z09 Encounter for follow-up examination after completed treatment for conditions other than malignant neoplasm: Secondary | ICD-10-CM

## 2022-04-09 LAB — SALMONELLA/SHIGELLA CULT, CAMPY EIA AND SHIGA TOXIN RFL ECOLI
MICRO NUMBER: 13698584
MICRO NUMBER:: 13698585
MICRO NUMBER:: 13698586
Result:: NOT DETECTED
SHIGA RESULT:: NOT DETECTED
SPECIMEN QUALITY: ADEQUATE
SPECIMEN QUALITY:: ADEQUATE
SPECIMEN QUALITY:: ADEQUATE

## 2022-04-09 LAB — OVA AND PARASITE EXAMINATION
CONCENTRATE RESULT:: NONE SEEN
MICRO NUMBER:: 13698489
SPECIMEN QUALITY:: ADEQUATE
TRICHROME RESULT:: NONE SEEN

## 2022-04-09 LAB — GIARDIA ANTIGEN
MICRO NUMBER:: 13698504
RESULT:: NOT DETECTED
SPECIMEN QUALITY:: ADEQUATE

## 2022-04-09 MED ORDER — METFORMIN HCL 500 MG PO TABS: 1000.0000 mg | ORAL_TABLET | Freq: Two times a day (BID) | ORAL | 0 refills | Status: DC

## 2022-04-09 NOTE — Assessment & Plan Note (Signed)
Appears there is not prior records of T2DM dx, and she was managed on glipizide but d/c by Dr. Linwood Dibbles in June.  Pt could not provide me this hx during visit today.  She seems to be tolerating metformin w/o concerning SE and her renal function in ED was reviewed and good Will have her increase metformin dose to 1000 mg BID with food/meals and f/up in office. Lab Results  Component Value Date   HGBA1C 6.0 (H) 02/13/2022   Pt reports sugars high x 2 days, and this may be secondary to acute illness, encouraged pt to continue meds for illness, increase metformin and monitor CBGs fasting and avoid other readings during the day due to elevation from meals. Will wait to add any other meds until recheck in office with meter =- would like to avoid hypoglycemia with her hx (CVA/falls etc)

## 2022-04-09 NOTE — Telephone Encounter (Signed)
  Chief Complaint: BS over 200 past 2 weeks Symptoms: frequency (urinary), Lightheadedness (has at baseline, dizziness (fell this am and scraped knee Frequency: last 2 weeks Pertinent Negatives: Patient denies breathing  Disposition: [] ED /[] Urgent Care (no appt availability in office) / [] Appointment(In office/virtual)/ [x]  [place] [facility]/ [] Home Care/ [] Refused Recommended Disposition /[] [place] [facility]/ []  Follow-up with PCP Additional Notes: no available in office appts- [facility] today Reason for Disposition  SEVERE dizziness (e.g., unable to stand, requires support to walk, feels like passing out now)  Answer Assessment - Initial Assessment Questions 1. BLOOD GLUCOSE: "What is your blood glucose level?"      259 at 0840 over 200 past 2 weeks  261 0850 2. ONSET: "When did you check the blood glucose?"     0840 and 0850 3. USUAL RANGE: "What is your glucose level usually?" (e.g., usual fasting morning value, usual evening value)     200's 4. KETONES: "Do you check for ketones (urine or blood test strips)?" If Yes, ask: "What does the test show now?"      no 5. TYPE 1 or 2:  "Do you know what type of diabetes you have?"  (e.g., Type 1, Type 2, Gestational; doesn't know)      Type 2  6. INSULIN: "Do you take insulin?" "What type of insulin(s) do you use? What is the mode of delivery? (syringe, pen; injection or pump)?"      no 7. DIABETES PILLS: "Do you take any pills for your diabetes?" If Yes, ask: "Have you missed taking any pills recently?"     no 8. OTHER SYMPTOMS: "Do you have any symptoms?" (e.g., fever, frequent urination, difficulty breathing, dizziness, weakness, vomiting)     Frequent urination- fell (walked in living room and foot sliid)  Answer Assessment - Initial Assessment Questions 1. DESCRIPTION: "Describe your dizziness."     lightheaded 2. LIGHTHEADED: "Do you feel lightheaded?" (e.g., somewhat faint, woozy, weak upon standing)     yes 3.  VERTIGO: "Do you feel like either you or the room is spinning or tilting?" (i.e. vertigo)     no 4. SEVERITY: "How bad is it?"  "Do you feel like you are going to faint?" "Can you stand and walk?"   - MILD: Feels slightly dizzy, but walking normally.   - MODERATE: Feels unsteady when walking, but not falling; interferes with normal activities (e.g., school, work).   - SEVERE: Unable to walk without falling, or requires assistance to walk without falling; feels like passing out now.      Severe- fell this  5. ONSET:  "When did the dizziness begin?"     H/o CVA and dizziness baseline 6. AGGRAVATING FACTORS: "Does anything make it worse?" (e.g., standing, change in head position)    Dizziness comes and goes no matter what position 7. HEART RATE: "Can you tell me your heart rate?" "How many beats in 15 seconds?"  (Note: not all patients can do this)       97 8. CAUSE: "What do you think is causing the dizziness?"     Baseline CVA 9. RECURRENT SYMPTOM: "Have you had dizziness before?" If Yes, ask: "When was the last time?" "What happened that time?"     chronic 10. OTHER SYMPTOMS: "Do you have any other symptoms?" (e.g., fever, chest pain, vomiting, diarrhea, bleeding)       UTI, diarrhea  Protocols used: Diabetes - High Blood Sugar-A-AH, Dizziness - Lightheadedness-A-AH

## 2022-04-09 NOTE — Progress Notes (Signed)
Name: Stephanie [name]   MRN: [id number]    DOB: 1966/02/09   Date:04/09/2022       Progress Note  Subjective:    Chief Complaint  Chief Complaint  Patient presents with   Follow-up   Diabetes    Blood sugars have been elevated for the past month around 269. Pt fell and scraped her knee    I connected with  [name] on 04/09/22 at  3:00 PM EDT by telephone and verified that I am speaking with the correct person using two identifiers.   I discussed the limitations, risks, security and privacy concerns of performing an evaluation and management service by telephone and the availability of in person appointments. Staff also discussed with the patient that there may be a patient responsible charge related to this service.  Patient verbalized understanding and agreed to proceed with encounter. Patient Location: home Provider Location: [facility] clinic office  Additional Individuals present: none  HPI  Presents for concerns of high blood sugars, BP and HR issues? Later she states that she had appt for ED f/up Glucose has been elevated for the past couple days was 260's Checking blood sugars 3-4 times a day -  200's in the am fasting and higher postprandial On metformin 500 mg BID - started by Dr. Linwood Dibbles only a month or so ago, no hx of DM, but family hx, only A1C that I can see is recent and in predm range Lab Results  Component Value Date   HGBA1C 6.0 (H) 02/13/2022    She reports her BP is low 103/69 -I reviewed her flowsheets and vital signs and she has had blood pressure similar to this in office in the past most of her blood pressures are 120 systolic She reports a fast heart rate of HR 99 She is not on any blood pressure or heart rate medications but she is prescribed Lasix which she is taking for lower extremity edema and recent refill was given to take up to twice daily as needed, patient does not know if she is taking it daily or not because someone else manages her  medications BP Readings from Last 6 Encounters:  04/09/22 104/68  04/06/22 122/89  03/29/22 128/80  02/20/22 100/62  02/17/22 129/73  02/13/22 (!) 148/92   Pulse Readings from Last 3 Encounters:  04/09/22 93  04/06/22 78  03/29/22 94     Pt is currently on abx for UTI after ED eval 3 days ago -about a week prior to that she was in office and also treated with antibiotic for UTI She had initially gone to the ER because of abdominal pain and concern for kidney stones She says that she has kidney stones however CT shows they are nonobstructing stones in her kidneys and not in ureters She was unaware that she had a UTI She does endorse loss of appetite -but states that she is eating and drinking normally, currently denies abdominal pain     Patient Active Problem List   Diagnosis Date Noted   Leg swelling 02/13/2022   Wheezing 02/13/2022   Migraine 05/12/2020   Type 2 diabetes mellitus (HCC) 08/22/2018   Other specified cardiac dysrhythmias 08/22/2018   Acute encephalopathy 08/21/2018   Kidney stone 03/10/2018   History of CVA (cerebrovascular accident) 01/28/2017   History of migraine 01/28/2017   Small bowel obstruction (HCC) 09/02/2016   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   Bipolar disorder (HCC) 05/09/2016   Hyperlipidemia, unspecified 05/09/2016   Irritable  bowel syndrome 05/09/2016   Lumbago with sciatica 05/09/2016   Esophageal reflux 05/09/2016   Scoliosis 05/09/2016   Diverticulosis of colon 05/09/2016   DDD (degenerative disc disease), cervical 05/09/2016   ICH (intracerebral hemorrhage) (HCC)    Essential hypertension, malignant 04/19/2016   Cytotoxic brain edema (HCC) 04/19/2016   IVH (intraventricular hemorrhage) (HCC) 04/18/2016   Bilateral carpal tunnel syndrome 04/12/2016   Falls frequently 01/12/2016   Trigeminal neuralgia 06/08/2015   Sacroiliac dysfunction 03/04/2014   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 or radiculopathy 05/20/2013    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   Tobacco use disorder 09/11/2012   History of cervical cancer 07/30/2012   Hepatitis C     Social History   Tobacco Use   Smoking status: Every Day    Packs/day: 1.00    Years: 1.50    Total pack years: 1.50    Types: Cigarettes   Smokeless tobacco: Never  Substance Use Topics   Alcohol use: Not Currently    Comment: no alcohol since 2002     Current Outpatient Medications:    albuterol (VENTOLIN HFA) 108 (90 Base) MCG/ACT inhaler, Inhale 2 puffs into the lungs every 6 (six) hours as needed for wheezing or shortness of breath., Disp: 8 g, Rfl: 0   ALPRAZolam (XANAX) 1 MG tablet, SMARTSIG:1 Tablet(s) By Mouth 1-2 Times Daily, Disp: , Rfl:    ARIPiprazole (ABILIFY) 15 MG tablet, Take 15 mg by mouth every morning., Disp: , Rfl:    BREZTRI AEROSPHERE 160-9-4.8 MCG/ACT AERO, Inhale 2 puffs into the lungs 2 (two) times daily., Disp: 5.9 g, Rfl: 3   CAPLYTA 42 MG capsule, Take 42 mg by mouth every evening., Disp: , Rfl:    cefdinir (OMNICEF) 300 MG capsule, Take 1 capsule (300 mg total) by mouth 2 (two) times daily., Disp: 14 capsule, Rfl: 0   cholecalciferol (VITAMIN D3) 25 MCG (1000 UT) tablet, Take 1,000 Units by mouth daily., Disp: , Rfl:    clopidogrel (PLAVIX) 75 MG tablet, Take 75 mg by mouth daily., Disp: , Rfl:    Continuous Blood Gluc Sensor (FREESTYLE LIBRE 2 SENSOR) MISC, USE WITH READER TO CHECK SUGARS DAILY, Disp: , Rfl:    Cyanocobalamin (VITAMIN B-12 PO), Take 1 tablet by mouth daily., Disp: , Rfl:    furosemide (LASIX) 40 MG tablet, Take 1 tablet (40 mg total) by mouth 2 (two) times daily as needed for edema (take 40 mg in the morning, can take additional 40 mg in afternoon if needed for edema)., Disp: 60 tablet, Rfl: 0   KLOR-CON M10 10 MEQ tablet, Take 10 mEq by mouth daily as needed (when taking furosemide)., Disp: , Rfl:    lamoTRIgine (LAMICTAL) 100 MG tablet, Take 1 tablet (100 mg total) by mouth 2 (two) times daily.,  Disp: 60 tablet, Rfl: 3   levETIRAcetam (KEPPRA) 250 MG tablet, Take 250 mg by mouth 2 (two) times daily., Disp: , Rfl:    metFORMIN (GLUCOPHAGE) 500 MG tablet, Take 1 tablet (500 mg total) by mouth 2 (two) times daily with a meal., Disp: 180 tablet, Rfl: 3   omeprazole (PRILOSEC) 40 MG capsule, Take 40 mg by mouth daily., Disp: , Rfl:    prazosin (MINIPRESS) 2 MG capsule, Take 4 mg by mouth every evening., Disp: , Rfl:    rosuvastatin (CRESTOR) 20 MG tablet, Take 20 mg by mouth daily., Disp: , Rfl:    traZODone (DESYREL) 150 MG tablet, Take 150 mg by mouth at bedtime as needed  for sleep. , Disp: , Rfl:    TRINTELLIX 20 MG TABS tablet, Take 20 mg by mouth daily., Disp: , Rfl:   Allergies  Allergen Reactions   Gabapentin Itching   Ibuprofen Itching   Sulfa Antibiotics Hives   Tylenol [Acetaminophen] Itching   Aspirin Itching   Buprenorphine Hcl Itching   Carbamazepine Itching, Other (See Comments) and Rash    Makes her feel like something is crawling under her skin.   Codeine Itching and Other (See Comments)    Makes her feel like something is crawling under her skin. Can take, sometimes makes her itch   Compazine Itching and Other (See Comments)    "makes my skin crawl"   Elemental Sulfur Hives, Rash and Other (See Comments)    Skin Rashes, Hives   Ioxaglate Itching   Ivp Dye [Iodinated Contrast Media] Itching and Other (See Comments)    Makes her itch really bad. Had to get 2-3 shots of Benadryl when she was in the hospital.   Metrizamide Itching   Naproxen Itching   Norco [Hydrocodone-Acetaminophen] Itching   Other Itching and Other (See Comments)    Makes her itch really bad. Had to get 2-3 shots of Benadryl when she was in the hospital.   Penicillin G Hives, Rash and Other (See Comments)    Has patient had a PCN reaction causing immediate rash, facial/tongue/throat swelling, SOB or lightheadedness with hypotension: Yes Has patient had a PCN reaction causing severe rash  involving mucus membranes or skin necrosis: No Has patient had a PCN reaction that required hospitalization: No Has patient had a PCN reaction occurring within the last 10 years: No If all of the above answers are "NO", then may proceed with Cephalosporin use.    Prochlorperazine Maleate Other (See Comments)    Compazine makes her skin crawl - needs 2-3 shots of Benadryl to get relief.   Reglan [Metoclopramide] Itching and Other (See Comments)    Makes her skin crawl   Toradol [Ketorolac Tromethamine] Itching   Tramadol Itching   Zofran [Ondansetron Hcl] Itching    Chart Review: I personally reviewed active problem list, medication list, allergies, family history, social history, health maintenance, notes from last encounter, lab results, imaging with the patient/caregiver today.   Review of Systems  Constitutional: Negative.   HENT: Negative.    Eyes: Negative.   Respiratory: Negative.    Cardiovascular: Negative.   Gastrointestinal: Negative.   Endocrine: Negative.   Genitourinary: Negative.   Musculoskeletal: Negative.   Skin: Negative.   Allergic/Immunologic: Negative.   Neurological: Negative.   Hematological: Negative.   Psychiatric/Behavioral: Negative.    All other systems reviewed and are negative.    Objective:    Virtual encounter, vitals limited, only able to obtain the following Today's Vitals   04/09/22 1010  BP: 104/68  Pulse: 93   There is no height or weight on file to calculate BMI. Nursing Note and Vital Signs reviewed.  Physical Exam Vitals and nursing note reviewed.  Pulmonary:     Effort: No respiratory distress.  Neurological:     Mental Status: She is alert.  Psychiatric:        Mood and Affect: Mood and affect normal.        Speech: Speech is delayed and slurred.     PE limited by telephone encounter  No results found for this or any previous visit (from the past 72 hour(s)).  Assessment and Plan:   Problem List Items  Addressed  This Visit       Endocrine   Type 2 diabetes mellitus (HCC)    Appears there is not prior records of T2DM dx, and she was managed on glipizide but d/c by Dr. Linwood Dibbles in June.  Pt could not provide me this hx during visit today.  She seems to be tolerating metformin w/o concerning SE and her renal function in ED was reviewed and good Will have her increase metformin dose to 1000 mg BID with food/meals and f/up in office. Lab Results  Component Value Date   HGBA1C 6.0 (H) 02/13/2022   Pt reports sugars high x 2 days, and this may be secondary to acute illness, encouraged pt to continue meds for illness, increase metformin and monitor CBGs fasting and avoid other readings during the day due to elevation from meals. Will wait to add any other meds until recheck in office with meter =- would like to avoid hypoglycemia with her hx (CVA/falls etc)       Relevant Medications   metFORMIN (GLUCOPHAGE) 500 MG tablet  - dose change - 1000 mg po BID with meals (gradually increase dose)    Other Visit Diagnoses     Acute cystitis without hematuria    -  Primary   culture came back contaminated - recollect at f/up visit - recommend she finish abx at least 5 d   Hyperglycemia       it was unclear why sugars were suddenly so high - but there was recent med changes, d/c glipizide and started metformin, illness, will need to f/up in office   Relevant Medications   metFORMIN (GLUCOPHAGE) 500 MG tablet  - dose change - 1000 mg po BID with meals (gradually increase dose)    Encounter for examination following treatment at hospital       chart reviewed thoroughly today - pt able to give limited hx, labs were reassuring, sugars high, urine culture contaminated       Pt was also concerned for BP low and HR high though they were both within NL - BP slightly soft, but not much unlike Bps in the past - advised her to stop lasix second dose - and may hold first dose if no LE edema - this may be  causing some hypovolemia and with high blood sugars she may be also auto-diuresing  She does not manage her own meds - so she was instructed to ask about meds in pill box and get lasix out and reserve for only PRN use  She has f/up appt next Monday - discussed with provider regarding reviewing BP/pulse, lasix, and sugars - pt instructed to bring in glucometer with her.    Can repeat A1C earliest Sept 7 2023 and otherwise med adjustments based on glucose readings Pt is already avoiding sugar/carbs - states sugars high with drinking only water and eating a yogurt Lab Results  Component Value Date   HGBA1C 6.0 (H) 02/13/2022  '  -Red flags and when to present for emergency care or RTC including but not limited to new/worsening/un-resolving symptoms,  reviewed with patient at time of visit. Follow up and care instructions discussed and provided in AVS. - I discussed the assessment and treatment plan with the patient. The patient was provided an opportunity to ask questions and all were answered. The patient agreed with the plan and demonstrated an understanding of the instructions.  - The patient was advised to call back or seek an in-person evaluation if the symptoms worsen or  if the condition fails to improve as anticipated.  I provided 32+ minutes of non-face-to-face time during this encounter.  [name], PA-C 04/09/22 3:30 PM

## 2022-04-10 DIAGNOSIS — Z7689 Persons encountering health services in other specified circumstances: Secondary | ICD-10-CM | POA: Diagnosis not present

## 2022-04-11 ENCOUNTER — Telehealth: Payer: Self-pay | Admitting: Internal Medicine

## 2022-04-11 NOTE — Telephone Encounter (Signed)
Pt needs PA for BREZTRI AEROSPHERE 160-9-4.8 MCG/ACT AERO / please advise

## 2022-04-11 NOTE — Telephone Encounter (Signed)
PA started today, waiting to hear back from ins. Most likely no covered

## 2022-04-12 ENCOUNTER — Other Ambulatory Visit: Payer: Medicaid Other

## 2022-04-15 ENCOUNTER — Ambulatory Visit: Payer: Medicaid Other | Admitting: Nurse Practitioner

## 2022-04-15 NOTE — Progress Notes (Deleted)
There were no vitals taken for this visit.   Subjective:    [name]: Stephanie [name], female    DOB: October 27, 1965, 56 y.o.   MRN: [id number]  HPI: Stephanie [name] is a 56 y.o. female  No chief complaint on file.   Relevant past medical, surgical, family and social history reviewed and updated as indicated. Interim medical history since our last visit reviewed. Allergies and medications reviewed and updated.  Review of Systems  Per HPI unless specifically indicated above     Objective:    There were no vitals taken for this visit.  Wt Readings from Last 3 Encounters:  03/29/22 198 lb 3.2 oz (89.9 kg)  02/20/22 206 lb 11.2 oz (93.8 kg)  02/13/22 215 lb (97.5 kg)    Physical Exam  Results for orders placed or performed during the hospital encounter of 04/06/22  Urine Culture   Specimen: Urine, Clean Catch  Result Value Ref Range   Specimen Description      URINE, CLEAN CATCH Performed at [facility], [address], [place], [place] [address]    Special Requests      NONE Performed at [facility], [address] [place], [place] [address]    Culture MULTIPLE SPECIES PRESENT, SUGGEST RECOLLECTION (A)    Report Status 04/07/2022 FINAL   Lipase, blood  Result Value Ref Range   Lipase 37 11 - 51 U/L  Comprehensive metabolic panel  Result Value Ref Range   Sodium 141 135 - 145 mmol/L   Potassium 3.4 (L) 3.5 - 5.1 mmol/L   Chloride 99 98 - 111 mmol/L   CO2 31 22 - 32 mmol/L   Glucose, Bld 179 (H) 70 - 99 mg/dL   BUN 15 6 - 20 mg/dL   Creatinine, Ser 6.28 0.44 - 1.00 mg/dL   Calcium 36.6 8.9 - 29.4 mg/dL   Total Protein 7.5 6.5 - 8.1 g/dL   Albumin 4.0 3.5 - 5.0 g/dL   AST 25 15 - 41 U/L   ALT 33 0 - 44 U/L   Alkaline Phosphatase 98 38 - 126 U/L   Total Bilirubin 0.3 0.3 - 1.2 mg/dL   GFR, Estimated >76 >54 mL/min   Anion gap 11 5 - 15  CBC  Result Value Ref Range   WBC 6.0 4.0 - 10.5 K/uL   RBC 4.68 3.87 - 5.11  MIL/uL   Hemoglobin 14.3 12.0 - 15.0 g/dL   HCT 65.0 35.4 - 65.6 %   MCV 93.2 80.0 - 100.0 fL   MCH 30.6 26.0 - 34.0 pg   MCHC 32.8 30.0 - 36.0 g/dL   RDW 81.2 75.1 - 70.0 %   Platelets 167 150 - 400 K/uL   nRBC 0.0 0.0 - 0.2 %  Urinalysis, Routine w reflex microscopic Urine, Clean Catch  Result Value Ref Range   Color, Urine YELLOW YELLOW   APPearance CLOUDY (A) CLEAR   Specific Gravity, Urine 1.013 1.005 - 1.030   pH 5.0 5.0 - 8.0   Glucose, UA NEGATIVE NEGATIVE mg/dL   Hgb urine dipstick SMALL (A) NEGATIVE   Bilirubin Urine NEGATIVE NEGATIVE   Ketones, ur NEGATIVE NEGATIVE mg/dL   Protein, ur NEGATIVE NEGATIVE mg/dL   Nitrite NEGATIVE NEGATIVE   Leukocytes,Ua LARGE (A) NEGATIVE   RBC / HPF 6-10 0 - 5 RBC/hpf   WBC, UA >50 (H) 0 - 5 WBC/hpf   Bacteria, UA MANY (A) NONE SEEN   Squamous Epithelial /  LPF 11-20 0 - 5   WBC Clumps PRESENT    Mucus PRESENT   I-Stat beta hCG blood, ED  Result Value Ref Range   I-stat hCG, quantitative 5.3 (H) <5 mIU/mL   Comment 3              Assessment & Plan:   Problem List Items Addressed This Visit   None    Follow up plan: No follow-ups on file.

## 2022-04-16 ENCOUNTER — Ambulatory Visit: Payer: Self-pay

## 2022-04-16 ENCOUNTER — Emergency Department: Payer: Medicaid Other

## 2022-04-16 ENCOUNTER — Observation Stay
Admission: EM | Admit: 2022-04-16 | Discharge: 2022-04-17 | Disposition: A | Discharge status: Home / Self Care | Payer: Medicaid Other | Attending: Internal Medicine | Admitting: Internal Medicine

## 2022-04-16 ENCOUNTER — Other Ambulatory Visit: Payer: Self-pay

## 2022-04-16 ENCOUNTER — Encounter: Payer: Self-pay | Admitting: Emergency Medicine

## 2022-04-16 ENCOUNTER — Observation Stay: Payer: Medicaid Other

## 2022-04-16 DIAGNOSIS — F17210 Nicotine dependence, cigarettes, uncomplicated: Secondary | ICD-10-CM | POA: Insufficient documentation

## 2022-04-16 DIAGNOSIS — Z743 Need for continuous supervision: Secondary | ICD-10-CM | POA: Diagnosis not present

## 2022-04-16 DIAGNOSIS — N390 Urinary tract infection, site not specified: Secondary | ICD-10-CM | POA: Diagnosis not present

## 2022-04-16 DIAGNOSIS — G9340 Encephalopathy, unspecified: Secondary | ICD-10-CM | POA: Diagnosis not present

## 2022-04-16 DIAGNOSIS — R4182 Altered mental status, unspecified: Secondary | ICD-10-CM | POA: Diagnosis not present

## 2022-04-16 DIAGNOSIS — N200 Calculus of kidney: Secondary | ICD-10-CM | POA: Diagnosis not present

## 2022-04-16 DIAGNOSIS — N3000 Acute cystitis without hematuria: Secondary | ICD-10-CM

## 2022-04-16 DIAGNOSIS — E785 Hyperlipidemia, unspecified: Secondary | ICD-10-CM

## 2022-04-16 DIAGNOSIS — I700 Atherosclerosis of aorta: Secondary | ICD-10-CM | POA: Diagnosis not present

## 2022-04-16 DIAGNOSIS — R410 Disorientation, unspecified: Secondary | ICD-10-CM

## 2022-04-16 DIAGNOSIS — E876 Hypokalemia: Secondary | ICD-10-CM | POA: Insufficient documentation

## 2022-04-16 DIAGNOSIS — E669 Obesity, unspecified: Secondary | ICD-10-CM | POA: Insufficient documentation

## 2022-04-16 DIAGNOSIS — R109 Unspecified abdominal pain: Secondary | ICD-10-CM | POA: Diagnosis not present

## 2022-04-16 DIAGNOSIS — G9389 Other specified disorders of brain: Secondary | ICD-10-CM | POA: Diagnosis not present

## 2022-04-16 DIAGNOSIS — I6782 Cerebral ischemia: Secondary | ICD-10-CM | POA: Diagnosis not present

## 2022-04-16 DIAGNOSIS — Z7901 Long term (current) use of anticoagulants: Secondary | ICD-10-CM | POA: Diagnosis not present

## 2022-04-16 DIAGNOSIS — B9620 Unspecified Escherichia coli [E. coli] as the cause of diseases classified elsewhere: Secondary | ICD-10-CM | POA: Insufficient documentation

## 2022-04-16 DIAGNOSIS — Z8673 Personal history of transient ischemic attack (TIA), and cerebral infarction without residual deficits: Secondary | ICD-10-CM | POA: Insufficient documentation

## 2022-04-16 DIAGNOSIS — R739 Hyperglycemia, unspecified: Secondary | ICD-10-CM | POA: Diagnosis not present

## 2022-04-16 DIAGNOSIS — Z79899 Other long term (current) drug therapy: Secondary | ICD-10-CM | POA: Insufficient documentation

## 2022-04-16 DIAGNOSIS — Z7984 Long term (current) use of oral hypoglycemic drugs: Secondary | ICD-10-CM | POA: Insufficient documentation

## 2022-04-16 DIAGNOSIS — I10 Essential (primary) hypertension: Secondary | ICD-10-CM | POA: Insufficient documentation

## 2022-04-16 DIAGNOSIS — Z6835 Body mass index (BMI) 35.0-35.9, adult: Secondary | ICD-10-CM | POA: Insufficient documentation

## 2022-04-16 DIAGNOSIS — J341 Cyst and mucocele of nose and nasal sinus: Secondary | ICD-10-CM | POA: Diagnosis not present

## 2022-04-16 DIAGNOSIS — R531 Weakness: Secondary | ICD-10-CM | POA: Diagnosis not present

## 2022-04-16 DIAGNOSIS — E1165 Type 2 diabetes mellitus with hyperglycemia: Secondary | ICD-10-CM | POA: Insufficient documentation

## 2022-04-16 DIAGNOSIS — E119 Type 2 diabetes mellitus without complications: Secondary | ICD-10-CM

## 2022-04-16 DIAGNOSIS — I639 Cerebral infarction, unspecified: Secondary | ICD-10-CM | POA: Diagnosis not present

## 2022-04-16 DIAGNOSIS — N281 Cyst of kidney, acquired: Secondary | ICD-10-CM | POA: Diagnosis not present

## 2022-04-16 DIAGNOSIS — N261 Atrophy of kidney (terminal): Secondary | ICD-10-CM | POA: Diagnosis not present

## 2022-04-16 DIAGNOSIS — F17200 Nicotine dependence, unspecified, uncomplicated: Secondary | ICD-10-CM | POA: Diagnosis present

## 2022-04-16 LAB — URINALYSIS, ROUTINE W REFLEX MICROSCOPIC
Bilirubin Urine: NEGATIVE
Glucose, UA: >=500 mg/dL — AB
Hgb urine dipstick: NEGATIVE
Ketones, ur: NEGATIVE mg/dL
Nitrite: NEGATIVE
Protein, ur: NEGATIVE mg/dL
Specific Gravity, Urine: 1.011 (ref 1.005–1.030)
WBC, UA: >50 WBC/hpf — ABNORMAL HIGH (ref 0–5)
pH: 6 (ref 5.0–8.0)

## 2022-04-16 LAB — PROTIME-INR
INR: 1 (ref 0.8–1.2)
Prothrombin Time: 12.9 seconds (ref 11.4–15.2)

## 2022-04-16 LAB — COMPREHENSIVE METABOLIC PANEL
ALT: 34 U/L (ref 0–44)
AST: 30 U/L (ref 15–41)
Albumin: 3.9 g/dL (ref 3.5–5.0)
Alkaline Phosphatase: 96 U/L (ref 38–126)
Anion gap: 11 (ref 5–15)
BUN: 19 mg/dL (ref 6–20)
CO2: 28 mmol/L (ref 22–32)
Calcium: 9.3 mg/dL (ref 8.9–10.3)
Chloride: 102 mmol/L (ref 98–111)
Creatinine, Ser: 0.83 mg/dL (ref 0.44–1.00)
GFR, Estimated: >60 mL/min (ref >60)
Glucose, Bld: 244 mg/dL — ABNORMAL HIGH (ref 70–99)
Potassium: 3.4 mmol/L — ABNORMAL LOW (ref 3.5–5.1)
Sodium: 141 mmol/L (ref 135–145)
Total Bilirubin: 0.5 mg/dL (ref 0.3–1.2)
Total Protein: 7.4 g/dL (ref 6.5–8.1)

## 2022-04-16 LAB — CBC
HCT: 42.1 % (ref 36.0–46.0)
Hemoglobin: 13.4 g/dL (ref 12.0–15.0)
MCH: 29.9 pg (ref 26.0–34.0)
MCHC: 31.8 g/dL (ref 30.0–36.0)
MCV: 94 fL (ref 80.0–100.0)
Platelets: 168 10*3/uL (ref 150–400)
RBC: 4.48 MIL/uL (ref 3.87–5.11)
RDW: 13.5 % (ref 11.5–15.5)
WBC: 6.4 10*3/uL (ref 4.0–10.5)
nRBC: 0 % (ref 0.0–0.2)

## 2022-04-16 LAB — POC URINE PREG, ED: Preg Test, Ur: NEGATIVE

## 2022-04-16 LAB — URINE DRUG SCREEN, QUALITATIVE (ARMC ONLY)
Amphetamines, Ur Screen: NOT DETECTED
Barbiturates, Ur Screen: NOT DETECTED
Benzodiazepine, Ur Scrn: POSITIVE — AB
Cannabinoid 50 Ng, Ur ~~LOC~~: NOT DETECTED
Cocaine Metabolite,Ur ~~LOC~~: NOT DETECTED
MDMA (Ecstasy)Ur Screen: NOT DETECTED
Methadone Scn, Ur: NOT DETECTED
Opiate, Ur Screen: NOT DETECTED
Phencyclidine (PCP) Ur S: NOT DETECTED
Tricyclic, Ur Screen: NOT DETECTED

## 2022-04-16 LAB — TSH: TSH: 1.218 u[IU]/mL (ref 0.350–4.500)

## 2022-04-16 LAB — GLUCOSE, CAPILLARY: Glucose-Capillary: 139 mg/dL — ABNORMAL HIGH (ref 70–99)

## 2022-04-16 MED ORDER — SENNOSIDES-DOCUSATE SODIUM 8.6-50 MG PO TABS: 1.0000 | ORAL_TABLET | Freq: Every evening | ORAL | Status: DC | PRN

## 2022-04-16 MED ORDER — HALOPERIDOL LACTATE 5 MG/ML IJ SOLN
2.0000 mg | Freq: Once | INTRAMUSCULAR | Status: AC
  Administered 2022-04-16: 2 mg via INTRAVENOUS
  Filled 2022-04-16: qty 1

## 2022-04-16 MED ORDER — ENOXAPARIN SODIUM 40 MG/0.4ML IJ SOSY
40.0000 mg | PREFILLED_SYRINGE | INTRAMUSCULAR | Status: DC
  Administered 2022-04-16: 40 mg via SUBCUTANEOUS
  Filled 2022-04-16: qty 0.4

## 2022-04-16 MED ORDER — SODIUM CHLORIDE 0.9 % IV SOLN: INTRAVENOUS | Status: DC

## 2022-04-16 MED ORDER — SODIUM CHLORIDE 0.9 % IV SOLN
2.0000 g | Freq: Once | INTRAVENOUS | Status: AC
  Administered 2022-04-16: 2 g via INTRAVENOUS
  Filled 2022-04-16: qty 20

## 2022-04-16 MED ORDER — ACETAMINOPHEN 325 MG PO TABS
650.0000 mg | ORAL_TABLET | ORAL | Status: DC | PRN
  Filled 2022-04-16: qty 2

## 2022-04-16 MED ORDER — ACETAMINOPHEN 160 MG/5ML PO SOLN: 650.0000 mg | ORAL | Status: DC | PRN

## 2022-04-16 MED ORDER — POTASSIUM CHLORIDE CRYS ER 20 MEQ PO TBCR
30.0000 meq | EXTENDED_RELEASE_TABLET | Freq: Once | ORAL | Status: AC
  Administered 2022-04-16: 30 meq via ORAL
  Filled 2022-04-16: qty 1

## 2022-04-16 MED ORDER — STROKE: EARLY STAGES OF RECOVERY BOOK: Freq: Once | Status: AC

## 2022-04-16 MED ORDER — INSULIN ASPART 100 UNIT/ML IJ SOLN
0.0000 [IU] | Freq: Three times a day (TID) | INTRAMUSCULAR | Status: DC
  Administered 2022-04-17 (×3): 2 [IU] via SUBCUTANEOUS
  Filled 2022-04-16 (×3): qty 1

## 2022-04-16 MED ORDER — ACETAMINOPHEN 650 MG RE SUPP: 650.0000 mg | RECTAL | Status: DC | PRN

## 2022-04-16 NOTE — H&P (Signed)
History and Physical    Patient: Stephanie [name] DOB: 01-25-1966 DOA: 04/16/2022 DOS: the patient was seen and examined on 04/16/2022 PCP: Margarita Mail, DO  Patient coming from: {Point_of_Origin:[address]}  Chief Complaint:  Chief Complaint  Patient presents with   Altered Mental Status   HPI: Stephanie [name] is a 56 y.o. female with medical history significant of ***   Upon admission into the emergency department patient was noted to have stable vital signs.  Labs significant for potassium 3.4, glucose 244, and anion gap of 11.  Urinalysis was positive for large leukocytes rare bacteria, and greater than 50 WBCs.  CT scan of the head did not note any acute abnormality.  Renal CT noted no acute abnormality with bilateral nephrolithiasis unchanged with stable small right renal cyst.  Patient had been given 2 g of Rocephin IV and Haldol.  Review of Systems: {ROS_Text:26778} Past Medical History:  Diagnosis Date   Closed fracture of shaft of humerus 01/20/2017   Diabetes mellitus without complication (HCC)    Difficult intubation    Diverticulitis    Headache    Hepatitis C    treated and resolved   History of kidney stones    Hypertension    Hypotension 08/22/2018   IBS (irritable bowel syndrome)    Kidney stones    Sciatica    Sleep apnea    on cpap   Stroke (HCC) 2017   memory loss   Superficial venous thrombosis of arm, left 07/22/2016   Past Surgical History:  Procedure Laterality Date   ABDOMINAL HYSTERECTOMY  2007   carpel tunnel syndrome  2017   HARDWARE REMOVAL Right 12/22/2017   Procedure: HARDWARE REMOVAL-RIGHT ARM;  Surgeon: Lyndle Herrlich, MD;  Location: [facility] ORS;  Service: Orthopedics;  Laterality: Right;  Removal of implant, superficial right humerus   HUMERUS IM NAIL Right 03/11/2017   Procedure: INTRAMEDULLARY (IM) NAIL HUMERAL;  Surgeon: Lyndle Herrlich, MD;  Location: [facility] ORS;  Service: Orthopedics;  Laterality: Right;   KIDNEY STONE  SURGERY Right 02/12/2012   KIDNEY STONE SURGERY Left 07/15/2012   LIGATION OF ARTERIOVENOUS  FISTULA Left 06/21/2016   Procedure: LIGATION OF ARTERIOVENOUS  FISTULA ( LIGATION BASILIC VEIN );  Surgeon: Renford Dills, MD;  Location: [facility] ORS;  Service: Vascular;  Laterality: Left;   LITHOTRIPSY     OOPHORECTOMY     SINUS EXPLORATION     TONSILLECTOMY     Social History:  reports that she has been smoking cigarettes. She has a 1.50 pack-year smoking history. She has never used smokeless tobacco. She reports that she does not currently use alcohol. She reports that she does not use drugs.  Allergies  Allergen Reactions   Gabapentin Itching   Ibuprofen Itching   Sulfa Antibiotics Hives   Tylenol [Acetaminophen] Itching   Aspirin Itching   Buprenorphine Hcl Itching   Carbamazepine Itching, Other (See Comments) and Rash    Makes her feel like something is crawling under her skin.   Codeine Itching and Other (See Comments)    Makes her feel like something is crawling under her skin. Can take, sometimes makes her itch   Compazine Itching and Other (See Comments)    "makes my skin crawl"   Elemental Sulfur Hives, Rash and Other (See Comments)    Skin Rashes, Hives   Ioxaglate Itching   Ivp Dye [Iodinated Contrast Media] Itching and Other (See Comments)    Makes her itch really bad. Had to get  2-3 shots of Benadryl when she was in the hospital.   Metrizamide Itching   Naproxen Itching   Norco [Hydrocodone-Acetaminophen] Itching   Other Itching and Other (See Comments)    Makes her itch really bad. Had to get 2-3 shots of Benadryl when she was in the hospital.   Penicillin G Hives, Rash and Other (See Comments)    Has patient had a PCN reaction causing immediate rash, facial/tongue/throat swelling, SOB or lightheadedness with hypotension: Yes Has patient had a PCN reaction causing severe rash involving mucus membranes or skin necrosis: No Has patient had a PCN reaction that required  hospitalization: No Has patient had a PCN reaction occurring within the last 10 years: No If all of the above answers are "NO", then may proceed with Cephalosporin use.    Prochlorperazine Maleate Other (See Comments)    Compazine makes her skin crawl - needs 2-3 shots of Benadryl to get relief.   Reglan [Metoclopramide] Itching and Other (See Comments)    Makes her skin crawl   Toradol [Ketorolac Tromethamine] Itching   Tramadol Itching   Zofran [Ondansetron Hcl] Itching    Family History  Problem Relation Age of Onset   Heart failure Mother    Diabetes Father    Cancer Sister     Prior to Admission medications   Medication Sig Start Date End Date Taking? Authorizing Provider  albuterol (VENTOLIN HFA) 108 (90 Base) MCG/ACT inhaler Inhale 2 puffs into the lungs every 6 (six) hours as needed for wheezing or shortness of breath. 02/13/22   Caro Laroche, DO  ALPRAZolam Prudy Feeler) 1 MG tablet SMARTSIG:1 Tablet(s) By Mouth 1-2 Times Daily 02/13/22   [provider]  ARIPiprazole (ABILIFY) 15 MG tablet Take 15 mg by mouth every morning. 06/10/21   [provider]  BREZTRI AEROSPHERE 160-9-4.8 MCG/ACT AERO Inhale 2 puffs into the lungs 2 (two) times daily. 02/20/22   Caro Laroche, DO  CAPLYTA 42 MG capsule Take 42 mg by mouth every evening. 06/08/21   [provider]  cefdinir (OMNICEF) 300 MG capsule Take 1 capsule (300 mg total) by mouth 2 (two) times daily. 04/06/22   Renne Crigler, PA-C  cholecalciferol (VITAMIN D3) 25 MCG (1000 UT) tablet Take 1,000 Units by mouth daily.    [provider]  clopidogrel (PLAVIX) 75 MG tablet Take 75 mg by mouth daily. 02/25/22   [provider]  Continuous Blood Gluc Sensor (FREESTYLE LIBRE 2 SENSOR) MISC USE WITH READER TO CHECK SUGARS DAILY 02/01/22   [provider]  Cyanocobalamin (VITAMIN B-12 PO) Take 1 tablet by mouth daily.    [provider]  furosemide (LASIX) 40 MG tablet Take 1  tablet (40 mg total) by mouth 2 (two) times daily as needed for edema (take 40 mg in the morning, can take additional 40 mg in afternoon if needed for edema). 03/29/22   Berniece Salines, FNP  KLOR-CON M10 10 MEQ tablet Take 10 mEq by mouth daily as needed (when taking furosemide). 01/23/21   [provider]  lamoTRIgine (LAMICTAL) 100 MG tablet Take 1 tablet (100 mg total) by mouth 2 (two) times daily. 06/20/20   York Spaniel, MD  levETIRAcetam (KEPPRA) 250 MG tablet Take 250 mg by mouth 2 (two) times daily. 01/15/22   [provider]  metFORMIN (GLUCOPHAGE) 500 MG tablet Take 2 tablets (1,000 mg total) by mouth 2 (two) times daily with a meal. 04/09/22   [name], PA-C  omeprazole (PRILOSEC)  40 MG capsule Take 40 mg by mouth daily. 08/13/19   [provider]  prazosin (MINIPRESS) 2 MG capsule Take 4 mg by mouth every evening. 02/15/21   [provider]  rosuvastatin (CRESTOR) 20 MG tablet Take 20 mg by mouth daily. 12/29/21   [provider]  traZODone (DESYREL) 150 MG tablet Take 150 mg by mouth at bedtime as needed for sleep.  05/30/20   [provider]  TRINTELLIX 20 MG TABS tablet Take 20 mg by mouth daily. 01/22/21   [provider]    Physical Exam: Vitals:   04/16/22 1211 04/16/22 1213 04/16/22 1540 04/16/22 1827  BP:  125/72 120/79 (!) 141/78  Pulse:  87 71   Resp:  18 20 18   Temp:  98.4 F (36.9 C) 97.9 F (36.6 C)   TempSrc:  Oral Oral   SpO2: 96% 96% 95% 95%  Weight:  88 kg    Height:  5\' 2"  (1.575 m)     *** Data Reviewed: {Tip this will not be part of the note when signed- Document your independent interpretation of telemetry tracing, EKG, lab, Radiology test or any other diagnostic tests. Add any new diagnostic test ordered today. (Optional):26781} EKG reveals sinus rhythm at 64 bpm with right bundle branch block.  Assessment and Plan: Acute metabolic encephalopathy  -Admit to a telemetry  bed -Neurochecks -Follow-up MRI of the brain -Check TSH and UDS  Urinary tract infection Patient had been diagnosed with a urinary tract infection, but just recently completed antibiotics.  Urinalysis positive for large leukocytes, rare bacteria, and greater than 50 WBCs.  Patient has been given Rocephin IV. -Check urine culture  Hypokalemia Acute.  Potassium 3.4 -Give potassium chloride 30 mEq p.o. x1 dose -Continue to monitor and replace as needed  Controlled diabetes type 2 with hyperglycemia On admission glucose elevated at 244.  Patient's last hemoglobin A1c was 6 on 02/13/2022. -Hypoglycemia protocol -CBGs before every meal with sensitive SSI -Adjust insulin regimen as needed   Obesity BMI 35.48 kg/m  DVT prophylaxis: Lovenox Advance Care Planning:   Code Status: Full Code ***  Consults: ***  Family Communication: ***  Severity of Illness: {Observation/Inpatient:21159}  Author: , MD 04/16/2022 6:54 PM  For on call review [email or web address].

## 2022-04-16 NOTE — Telephone Encounter (Addendum)
Son called to complain that pt is still in waiting room at [facility]. She was taken in by EMS and her VS were taken and she was put in a wheel chair and has been sitting in waiting room since. I explained she has also had lab work done and given medication. There is no further charting to see. Advised him to not take her home, she is being monitored and it would be best for her to continue there until discharged.

## 2022-04-16 NOTE — ED Provider Triage Note (Signed)
Emergency Medicine Provider Triage Evaluation Note  Stephanie [name], a 56 y.o. female  was evaluated in triage.  Pt complains of confusion and slurred speech.  Patient reports was of her normal level of health and cognition yesterday before she went to bed.  She awoke this morning noting that she felt like she was confused, feeling fatigue and dry mouth.  She denies any fevers, chills, sweats.  She does endorse poor oral intake today.  Patient with some baseline left-sided paralysis secondary to a 2017 stroke.  Stroke screen is negative per EMS report today.  Review of Systems  Positive: Confusion Negative: Head injury, LOC  Physical Exam  BP 120/79   Pulse 71   Temp 97.9 F (36.6 C) (Oral)   Resp 20   Ht 5\' 2"  (1.575 m)   Wt 88 kg   SpO2 95%   BMI 35.48 kg/m  Gen:   Awake, no distress  NAD Resp:  Normal effort CTA MSK:   Moves extremities without difficulty  Other:    Medical Decision Making  Medically screening exam initiated at 4:22 PM.  Appropriate orders placed.  Stephanie [name] was informed that the remainder of the evaluation will be completed by another provider, this initial triage assessment does not replace that evaluation, and the importance of remaining in the ED until their evaluation is complete.  Patient to the ED after she called out after speaking to her primary provider this morning.  She awoke feeling confused and tired.  She presents to the ED with reports of confusion and poor oral intake.   Stephanie Guerin, PA-C 04/16/22 1624

## 2022-04-16 NOTE — ED Notes (Signed)
Husband at bedside reports pt is at her baseline mentation. Pt states still feels "off". Meds given per [facility] will monitor.

## 2022-04-16 NOTE — ED Provider Notes (Signed)
[facility] Provider Note    Event Date/Time   First MD Initiated Contact with Patient 04/16/22 1709     (approximate)   History   Altered Mental Status   HPI  Stephanie [name] is a 56 y.o. female  with h/o CVA here with fatigue, confusion. Pt states she was in her usual state of health yesterday. She MORNINGS concerns last morning;.  She felt very sleepy.  She felt like her speech is more slurred.  She states she states that husband as well as friends 643 and bladder and felt like she was getting more confused today.  She has a history of recent UTI and actually just finished a course of antibiotics yesterday.  She has continued to have some increased urinary frequency.  She has a history of stroke with left-sided numbness as well as tingling sensation to her speech, but states that her speech is worse.  Denies any numbness or weakness.  Denies known sick contacts.  No cough.  Breath.  Chest abdominal pain on the right side today, which is new.     Physical Exam   Triage Vital Signs: ED Triage Vitals  Enc Vitals Group     BP 04/16/22 1213 125/72     Pulse Rate 04/16/22 1213 87     Resp 04/16/22 1213 18     Temp 04/16/22 1213 98.4 F (36.9 C)     Temp Source 04/16/22 1213 Oral     SpO2 04/16/22 1211 96 %     Weight 04/16/22 1213 194 lb (88 kg)     Height 04/16/22 1213 5\' 2"  (1.575 m)     Head Circumference --      Peak Flow --      Pain Score 04/16/22 1213 10     Pain Loc --      Pain Edu? --      Excl. in GC? --     Most recent vital signs: Vitals:   04/16/22 2030 04/16/22 2323  BP: 120/69 119/76  Pulse: 68 70  Resp: 16 16  Temp: (!) 97.5 F (36.4 C) 97.6 F (36.4 C)  SpO2: 98% 96%     General: Awake, no distress.  CV:  Good peripheral perfusion.  Resp:  Normal effort.  Lungs clear bilaterally. Abd:  No distention.  No CVA tenderness. Other:  Mildly slurred.  Slight subjective left-sided weakness bilaterally.  Normal sensation  light touch.  No other cranial nerve deficits.   ED Results / Procedures / Treatments   Labs (all labs ordered are listed, but only abnormal results are displayed) Labs Reviewed  COMPREHENSIVE METABOLIC PANEL - Abnormal; Notable for the following components:      Result Value   Potassium 3.4 (*)    Glucose, Bld 244 (*)    All other components within normal limits  URINALYSIS, ROUTINE W REFLEX MICROSCOPIC - Abnormal; Notable for the following components:   Color, Urine YELLOW (*)    APPearance HAZY (*)    Glucose, UA >=500 (*)    Leukocytes,Ua LARGE (*)    WBC, UA >50 (*)    Bacteria, UA RARE (*)    All other components within normal limits  URINE DRUG SCREEN, QUALITATIVE ([facility] ONLY) - Abnormal; Notable for the following components:   Benzodiazepine, Ur Scrn POSITIVE (*)    All other components within normal limits  GLUCOSE, CAPILLARY - Abnormal; Notable for the following components:   Glucose-Capillary 139 (*)    All other  components within normal limits  URINE CULTURE  CBC  PROTIME-INR  TSH  CBC  BASIC METABOLIC PANEL  HIV ANTIBODY (ROUTINE TESTING W REFLEX)  POC URINE PREG, ED  CBG MONITORING, ED     EKG    RADIOLOGY CT head: No acute abnormality CT renal stone: Bilateral nephrolithiasis is unchanged   I also independently reviewed and agree with radiologist interpretations.   PROCEDURES:  Critical Care performed: No  .1-3 Lead EKG Interpretation  Performed by: Shaune Pollack, MD Authorized by: Shaune Pollack, MD     Interpretation: non-specific     ECG rate:  60-70   ECG rate assessment: normal     Rhythm: sinus rhythm     Ectopy: none     Conduction: normal   Comments:     Indication: Weakness   MEDICATIONS ORDERED IN ED: Medications   stroke: early stages of recovery book (has no administration in time range)  0.9 %  sodium chloride infusion ( Intravenous New Bag/Given 04/16/22 2237)  acetaminophen (TYLENOL) tablet 650 mg (has no  administration in time range)    Or  acetaminophen (TYLENOL) 160 MG/5ML solution 650 mg (has no administration in time range)    Or  acetaminophen (TYLENOL) suppository 650 mg (has no administration in time range)  senna-docusate (Senokot-S) tablet 1 tablet (has no administration in time range)  enoxaparin (LOVENOX) injection 40 mg (40 mg Subcutaneous Given 04/16/22 2236)  insulin aspart (novoLOG) injection 0-9 Units (has no administration in time range)  cefTRIAXone (ROCEPHIN) 2 g in sodium chloride 0.9 % 100 mL IVPB (has no administration in time range)  rosuvastatin (CRESTOR) tablet 20 mg (has no administration in time range)  clopidogrel (PLAVIX) tablet 75 mg (has no administration in time range)  pantoprazole (PROTONIX) EC tablet 40 mg (has no administration in time range)  levETIRAcetam (KEPPRA) tablet 250 mg (has no administration in time range)  mometasone-formoterol (DULERA) 100-5 MCG/ACT inhaler 2 puff (has no administration in time range)    And  umeclidinium bromide (INCRUSE ELLIPTA) 62.5 MCG/ACT 1 puff (has no administration in time range)  cefTRIAXone (ROCEPHIN) 2 g in sodium chloride 0.9 % 100 mL IVPB (0 g Intravenous Stopped 04/16/22 2015)  haloperidol lactate (HALDOL) injection 2 mg (2 mg Intravenous Given 04/16/22 1811)  potassium chloride (KLOR-CON M) CR tablet 30 mEq (30 mEq Oral Given 04/16/22 2235)     IMPRESSION / MDM / ASSESSMENT AND PLAN / ED COURSE  I reviewed the triage vital signs and the nursing notes.                               The patient is on the cardiac monitor to evaluate for evidence of arrhythmia and/or significant heart rate changes.   Ddx:  Differential includes the following, with pertinent life- or limb-threatening emergencies considered:  CVA, TIA, acute encephalopathy in setting of UTI, dehydration, polypharmacy, anemia  Patient's presentation is most consistent with acute presentation with potential threat to life or bodily function.  MDM:   56 yo F with PMHx CVA here with AMS, weakness. Pt with slightly slurred speech but o/w no new focal deficits. VSS. CBC without leukocytosis. Lytes wnl. UA concerning for ongoing UTI despite recent course and suspect this could be causing encephalopathy/possible recurrence of her prior stroke deficits. CT stone study neg for obstruction or other abnormality. CT Head negative.Pt confused from baseline.  Will admit for acute encephalopathy possibly 2/2 UTI, with plan  for MRI given her CVA history.    MEDICATIONS GIVEN IN ED: Medications   stroke: early stages of recovery book (has no administration in time range)  0.9 %  sodium chloride infusion ( Intravenous New Bag/Given 04/16/22 2237)  acetaminophen (TYLENOL) tablet 650 mg (has no administration in time range)    Or  acetaminophen (TYLENOL) 160 MG/5ML solution 650 mg (has no administration in time range)    Or  acetaminophen (TYLENOL) suppository 650 mg (has no administration in time range)  senna-docusate (Senokot-S) tablet 1 tablet (has no administration in time range)  enoxaparin (LOVENOX) injection 40 mg (40 mg Subcutaneous Given 04/16/22 2236)  insulin aspart (novoLOG) injection 0-9 Units (has no administration in time range)  cefTRIAXone (ROCEPHIN) 2 g in sodium chloride 0.9 % 100 mL IVPB (has no administration in time range)  rosuvastatin (CRESTOR) tablet 20 mg (has no administration in time range)  clopidogrel (PLAVIX) tablet 75 mg (has no administration in time range)  pantoprazole (PROTONIX) EC tablet 40 mg (has no administration in time range)  levETIRAcetam (KEPPRA) tablet 250 mg (has no administration in time range)  mometasone-formoterol (DULERA) 100-5 MCG/ACT inhaler 2 puff (has no administration in time range)    And  umeclidinium bromide (INCRUSE ELLIPTA) 62.5 MCG/ACT 1 puff (has no administration in time range)  cefTRIAXone (ROCEPHIN) 2 g in sodium chloride 0.9 % 100 mL IVPB (0 g Intravenous Stopped 04/16/22 2015)  haloperidol  lactate (HALDOL) injection 2 mg (2 mg Intravenous Given 04/16/22 1811)  potassium chloride (KLOR-CON M) CR tablet 30 mEq (30 mEq Oral Given 04/16/22 2235)     Consults:  Hosptialist   EMR reviewed       FINAL CLINICAL IMPRESSION(S) / ED DIAGNOSES   Final diagnoses:  Disorientation  Lower urinary tract infectious disease     Rx / DC Orders   ED Discharge Orders     None        Note:  This document was prepared using Dragon voice recognition software and may include unintentional dictation errors.   Shaune Pollack, MD 04/17/22 [phone]

## 2022-04-16 NOTE — Telephone Encounter (Signed)
Answer Assessment - Initial Assessment Questions 1. REASON FOR CALL or QUESTION: "What is your reason for calling today?" or "How can I best help you?" or "What question do you have that I can help answer?"     Son called, upset that she was taken to hospital by EMS but has been sitting in waiting room in Wheel chair since.  Protocols used: Information Only Call - No Triage-A-AH

## 2022-04-16 NOTE — Telephone Encounter (Signed)
  Chief Complaint: Low BP, HA Symptoms: BP 100/63 Frequency: HA 1 hour Pertinent Negatives: Patient denies  Disposition: [x] ED /[] Urgent Care (no appt availability in office) / [] Appointment(In office/virtual)/ []  [facility]/ [] Home Care/ [] Refused Recommended Disposition /[] [facility]/ []  Follow-up with PCP Additional Notes:  Pt is slurring her words and reports worse HA of her life. Pt is alone and asked me to call Son and  husband.Called husband - LMOM. Called son - no answer. Asked pt if I could call EMS. Pt wants to call husband herself and have me call her back.  Called pt back 3 times - was conferenced into call by pt and spoke to her and her husband. Husband is 45 minutes away. Husband will meet pt at the hospital.  Called EMS  - Stayed on phone while pt spoke with EMS. EMS was dispatched to home.     Reason for Disposition  [1] Loss of speech or garbled speech AND [2] new-onset  Answer Assessment - Initial Assessment Questions 1. LOCATION: "Where does it hurt?"      HA 2. ONSET: "When did the headache start?" (Minutes, hours or days)      About 1 hour ago 3. PATTERN: "Does the pain come and go, or has it been constant since it started?"     Constant 4. SEVERITY: "How bad is the pain?" and "What does it keep you from doing?"  (e.g., Scale 1-10; mild, moderate, or severe)   - MILD (1-3): doesn't interfere with normal activities    - MODERATE (4-7): interferes with normal activities or awakens from sleep    - SEVERE (8-10): excruciating pain, unable to do any normal activities        8-9/10 5. RECURRENT SYMPTOM: "Have you ever had headaches before?" If Yes, ask: "When was the last time?" and "What happened that time?"      yes 6. CAUSE: "What do you think is causing the headache?"     Unsure - possible stroke 7. MIGRAINE: "Have you been diagnosed with migraine headaches?" If Yes, ask: "Is this headache similar?"       8. HEAD INJURY: "Has there been  any recent injury to the head?"       9. OTHER SYMPTOMS: "Do you have any other symptoms?" (fever, stiff neck, eye pain, sore throat, cold symptoms)     Low BP 100/63 - Hx of stroke. 10. PREGNANCY: "Is there any chance you are pregnant?" "When was your last menstrual period?"  Protocols used: [facility]

## 2022-04-16 NOTE — ED Triage Notes (Signed)
Patient to ED via [facility] from home for confusion. Patient finished meds yesterday for UTI. Patient is Aox4 but seems confused per EMS. 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 Stroke screen neg per EMS.

## 2022-04-17 ENCOUNTER — Ambulatory Visit: Payer: Medicaid Other | Admitting: Internal Medicine

## 2022-04-17 ENCOUNTER — Encounter: Payer: Self-pay | Admitting: Internal Medicine

## 2022-04-17 DIAGNOSIS — G9340 Encephalopathy, unspecified: Secondary | ICD-10-CM | POA: Diagnosis not present

## 2022-04-17 DIAGNOSIS — E876 Hypokalemia: Secondary | ICD-10-CM | POA: Diagnosis present

## 2022-04-17 LAB — CBC
HCT: 39 % (ref 36.0–46.0)
Hemoglobin: 12.7 g/dL (ref 12.0–15.0)
MCH: 30.2 pg (ref 26.0–34.0)
MCHC: 32.6 g/dL (ref 30.0–36.0)
MCV: 92.9 fL (ref 80.0–100.0)
Platelets: 154 10*3/uL (ref 150–400)
RBC: 4.2 MIL/uL (ref 3.87–5.11)
RDW: 13.6 % (ref 11.5–15.5)
WBC: 6.2 10*3/uL (ref 4.0–10.5)
nRBC: 0 % (ref 0.0–0.2)

## 2022-04-17 LAB — BASIC METABOLIC PANEL
Anion gap: 10 (ref 5–15)
BUN: 16 mg/dL (ref 6–20)
CO2: 29 mmol/L (ref 22–32)
Calcium: 9.2 mg/dL (ref 8.9–10.3)
Chloride: 104 mmol/L (ref 98–111)
Creatinine, Ser: 0.65 mg/dL (ref 0.44–1.00)
GFR, Estimated: >60 mL/min (ref >60)
Glucose, Bld: 148 mg/dL — ABNORMAL HIGH (ref 70–99)
Potassium: 3.1 mmol/L — ABNORMAL LOW (ref 3.5–5.1)
Sodium: 143 mmol/L (ref 135–145)

## 2022-04-17 LAB — GLUCOSE, CAPILLARY
Glucose-Capillary: 153 mg/dL — ABNORMAL HIGH (ref 70–99)
Glucose-Capillary: 160 mg/dL — ABNORMAL HIGH (ref 70–99)
Glucose-Capillary: 191 mg/dL — ABNORMAL HIGH (ref 70–99)

## 2022-04-17 LAB — HIV ANTIBODY (ROUTINE TESTING W REFLEX): HIV Screen 4th Generation wRfx: NONREACTIVE

## 2022-04-17 MED ORDER — VORTIOXETINE HBR 5 MG PO TABS
20.0000 mg | ORAL_TABLET | Freq: Every day | ORAL | Status: DC
  Administered 2022-04-17: 20 mg via ORAL
  Filled 2022-04-17: qty 4

## 2022-04-17 MED ORDER — DIAZEPAM 5 MG PO TABS
10.0000 mg | ORAL_TABLET | Freq: Three times a day (TID) | ORAL | Status: DC
  Administered 2022-04-17: 10 mg via ORAL
  Filled 2022-04-17: qty 2

## 2022-04-17 MED ORDER — ALBUTEROL SULFATE (2.5 MG/3ML) 0.083% IN NEBU: 2.5000 mg | INHALATION_SOLUTION | Freq: Four times a day (QID) | RESPIRATORY_TRACT | Status: DC | PRN

## 2022-04-17 MED ORDER — PANTOPRAZOLE SODIUM 40 MG PO TBEC
40.0000 mg | DELAYED_RELEASE_TABLET | Freq: Every day | ORAL | Status: DC
  Administered 2022-04-17: 40 mg via ORAL
  Filled 2022-04-17: qty 1

## 2022-04-17 MED ORDER — LUMATEPERONE TOSYLATE 42 MG PO CAPS
42.0000 mg | ORAL_CAPSULE | Freq: Every evening | ORAL | Status: DC
Start: 2022-04-17 — End: 2022-04-17

## 2022-04-17 MED ORDER — UMECLIDINIUM BROMIDE 62.5 MCG/ACT IN AEPB
1.0000 | INHALATION_SPRAY | Freq: Every day | RESPIRATORY_TRACT | Status: DC
  Administered 2022-04-17: 1 via RESPIRATORY_TRACT
  Filled 2022-04-17: qty 7

## 2022-04-17 MED ORDER — POTASSIUM CHLORIDE CRYS ER 20 MEQ PO TBCR
40.0000 meq | EXTENDED_RELEASE_TABLET | ORAL | Status: AC
  Administered 2022-04-17 (×2): 40 meq via ORAL
  Filled 2022-04-17 (×2): qty 2

## 2022-04-17 MED ORDER — MOMETASONE FURO-FORMOTEROL FUM 100-5 MCG/ACT IN AERO
2.0000 | INHALATION_SPRAY | Freq: Two times a day (BID) | RESPIRATORY_TRACT | Status: DC
  Administered 2022-04-17: 2 via RESPIRATORY_TRACT
  Filled 2022-04-17: qty 8.8

## 2022-04-17 MED ORDER — CLOPIDOGREL BISULFATE 75 MG PO TABS
75.0000 mg | ORAL_TABLET | Freq: Every day | ORAL | Status: DC
  Administered 2022-04-17: 75 mg via ORAL
  Filled 2022-04-17: qty 1

## 2022-04-17 MED ORDER — PRAZOSIN HCL 1 MG PO CAPS
4.0000 mg | ORAL_CAPSULE | Freq: Every evening | ORAL | Status: DC
  Administered 2022-04-17: 4 mg via ORAL
  Filled 2022-04-17: qty 4

## 2022-04-17 MED ORDER — LEVETIRACETAM 250 MG PO TABS
250.0000 mg | ORAL_TABLET | Freq: Two times a day (BID) | ORAL | Status: DC
  Administered 2022-04-17 (×2): 250 mg via ORAL
  Filled 2022-04-17 (×3): qty 1

## 2022-04-17 MED ORDER — MORPHINE SULFATE 10 MG/5ML PO SOLN
5.0000 mg | Freq: Once | ORAL | Status: AC
  Administered 2022-04-17: 5 mg via ORAL
  Filled 2022-04-17: qty 5

## 2022-04-17 MED ORDER — KETOROLAC TROMETHAMINE 15 MG/ML IJ SOLN: 15.0000 mg | Freq: Once | INTRAMUSCULAR | Status: DC

## 2022-04-17 MED ORDER — ROSUVASTATIN CALCIUM 20 MG PO TABS
20.0000 mg | ORAL_TABLET | Freq: Every day | ORAL | Status: DC
  Administered 2022-04-17: 20 mg via ORAL
  Filled 2022-04-17: qty 1

## 2022-04-17 MED ORDER — TRAZODONE HCL 50 MG PO TABS: 150.0000 mg | ORAL_TABLET | Freq: Every evening | ORAL | Status: DC | PRN

## 2022-04-17 MED ORDER — DIPHENHYDRAMINE HCL 50 MG/ML IJ SOLN: 12.5000 mg | Freq: Once | INTRAMUSCULAR | Status: DC

## 2022-04-17 MED ORDER — BUDESON-GLYCOPYRROL-FORMOTEROL 160-9-4.8 MCG/ACT IN AERO: 2.0000 | INHALATION_SPRAY | Freq: Two times a day (BID) | RESPIRATORY_TRACT | Status: DC

## 2022-04-17 MED ORDER — SODIUM CHLORIDE 0.9 % IV SOLN: 2.0000 g | INTRAVENOUS | Status: DC

## 2022-04-17 MED ORDER — ARIPIPRAZOLE 15 MG PO TABS: 15.0000 mg | ORAL_TABLET | Freq: Every morning | ORAL | Status: DC

## 2022-04-17 NOTE — Progress Notes (Signed)
Eeg done 

## 2022-04-17 NOTE — Evaluation (Signed)
Physical Therapy Evaluation Patient Details Name: Stephanie [name] MRN: [id number] DOB: [birth year] Today's Date: 04/17/2022  History of Present Illness  Stephanie [name] is a 56 y.o. female with medical history significant of hypertension,CVA, DM type II, chronic headaches, nephrolithiasis who presented after being acutely altered this morning.  Patient reported that she did not feel right when she woke up this morning.  She reported feeling fatigued.  After breakfast felt like she was getting more confused and she stated that it seemed like her speech was slurred.  She complained of having a right-sided headache.  After talking to her primary care office they advised her to come to the hospital for further evaluation.  Denied having any focal weakness change in vision, fever, nausea, vomiting, or diarrhea.  She had recently been diagnosed with a UTI and treated with course of cefdinir which she completed yesterday.  She does report still having some urinary frequency as well as some suprapubic abdominal tenderness.  Records note patient is followed by.  Dr. Carlynn Herald of neurology for these complex partial spells of impaired gait, impaired speech, impaired cognition with memory loss concerning for seizure for which she has been on lamotrigine and Keppra.     Upon admission into the emergency department patient was noted to have stable vital signs.  Labs significant for potassium 3.4, glucose 244, and anion gap of 11.  Urinalysis was positive for large leukocytes rare bacteria, and greater than 50 WBCs.  CT scan of the head did not note any acute abnormality.  Renal CT noted no acute abnormality with bilateral nephrolithiasis unchanged with stable small right renal cyst.  Patient had been given 2 g of Rocephin IV and Haldol.  Clinical Impression  Pt received in bed agreeable to PT eval. As per pt, the speech appears slurred due to old stroke and L sided facial droop and missing teeth, however when she get the  spell, the slurred speech worsens. PT assessment revealed bed mobility independent, transfers with CGA of 11 and ambulated in room with walker and with HHA of CGA. Pt's standing balance is fair evident by unable to SLS and tandem standing  >10 secs without assistance  putting pt at high risk of fall. Pt advised to use walker in room. Pt will benefit form stair training for a safe discharge.  Pt will benefit from HHPT after acute care to return to PLOF and reduce fall risk.      Recommendations for follow up therapy are one component of a multi-disciplinary discharge planning process, led by the attending physician.  Recommendations may be updated based on patient status, additional functional criteria and insurance authorization.  Follow Up Recommendations Home health PT      Assistance Recommended at Discharge Intermittent Supervision/Assistance  Patient can return home with the following  A little help with walking and/or transfers;A little help with bathing/dressing/bathroom;Assistance with cooking/housework;Assistance with feeding;Direct supervision/assist for medications management;Direct supervision/assist for financial management;Assist for transportation;Help with stairs or ramp for entrance    Equipment Recommendations Rolling walker (2 wheels)  Recommendations for Other Services       Functional Status Assessment Patient has had a recent decline in their functional status and demonstrates the ability to make significant improvements in function in a reasonable and predictable amount of time.     Precautions / Restrictions Precautions Precautions: Fall Restrictions Weight Bearing Restrictions: No      Mobility  Bed Mobility Overal bed mobility: Modified Independent  General bed mobility comments: rasied HOB    Transfers Overall transfer level: Needs assistance Equipment used: None Transfers: Sit to/from Stand, Bed to chair/wheelchair/BSC Sit to Stand:  Min guard   Step pivot transfers: Min guard       General transfer comment: due to IV and Telemetry pole.    Ambulation/Gait Ambulation/Gait assistance: Min guard Gait Distance (Feet): 30 Feet Assistive device: Rolling walker (2 wheels), None (attmepted with and w/o AD) Gait Pattern/deviations: Shuffle, Decreased stride length Gait velocity: decreased        Stairs            Wheelchair Mobility    Modified Rankin (Stroke Patients Only)       Balance Overall balance assessment: Needs assistance Sitting-balance support: Feet unsupported Sitting balance-Leahy Scale: Good     Standing balance support: Single extremity supported Standing balance-Leahy Scale: Fair   Single Leg Stance - Right Leg: 14 Single Leg Stance - Left Leg: 8 Tandem Stance - Right Leg: 17 Tandem Stance - Left Leg: 5                     Pertinent Vitals/Pain Pain Assessment Pain Assessment: 0-10 Pain Score: 8  Pain Descriptors / Indicators: Headache Pain Intervention(s): Patient requesting pain meds-RN notified    Home Living Family/patient expects to be discharged to:: Private residence Living Arrangements: Spouse/significant other Available Help at Discharge: Family Type of Home: Mobile home Home Access: Stairs to enter Entrance Stairs-Rails: Doctor, general practice of Steps: 3 rear and 5 front   Home Layout: One level Home Equipment: Cane - single point Additional Comments: Hand held shower    Prior Function Prior Level of Function : Independent/Modified Independent             Mobility Comments: Pt indepdendent at household level ambulation used SPC occassionally. Pt ambulated to the car and used steps independently. Doesn't drive but spouse drives her for appts and grocery shoppping. Husbandcleans and pt cooks ind. ADLs Comments: Independent     Hand Dominance   Dominant Hand: Right    Extremity/Trunk Assessment   Upper Extremity  Assessment Upper Extremity Assessment: Overall WFL for tasks assessed    Lower Extremity Assessment Lower Extremity Assessment: Generalized weakness;LLE deficits/detail LLE Deficits / Details: 3+/5 in hip and knee, 4/5 L ankle, RLE 4/5 grosssly LLE Sensation: WNL LLE Coordination: WNL;decreased gross motor    Cervical / Trunk Assessment Cervical / Trunk Assessment: Normal  Communication   Communication: No difficulties  Cognition Arousal/Alertness: Awake/alert Behavior During Therapy: WFL for tasks assessed/performed Overall Cognitive Status: Within Functional Limits for tasks assessed                                 General Comments: slurred speech due to old stroke and teeth missing        General Comments      Exercises     Assessment/Plan    PT Assessment Patient needs continued PT services  PT Problem List Decreased strength;Decreased activity tolerance;Decreased balance;Decreased coordination;Pain       PT Treatment Interventions Gait training;Stair training;Functional mobility training;Therapeutic activities;Therapeutic exercise;Balance training;Neuromuscular re-education;Patient/family education    PT Goals (Current goals can be found in the Care Plan section)  Acute Rehab PT Goals Patient Stated Goal: get better and go home PT Goal Formulation: With patient Time For Goal Achievement: 05/01/22 Potential to Achieve Goals: Good    Frequency Min 2X/week  Co-evaluation               AM-PAC PT "6 Clicks" Mobility  Outcome Measure Help needed turning from your back to your side while in a flat bed without using bedrails?: None Help needed moving from lying on your back to sitting on the side of a flat bed without using bedrails?: None Help needed moving to and from a bed to a chair (including a wheelchair)?: A Little Help needed standing up from a chair using your arms (e.g., wheelchair or bedside chair)?: A Little Help needed to walk  in hospital room?: A Little Help needed climbing 3-5 steps with a railing? : A Lot 6 Click Score: 19    End of Session Equipment Utilized During Treatment: Gait belt Activity Tolerance: Patient tolerated treatment well;Patient limited by pain Patient left: in chair;with call bell/phone within reach;with chair alarm set Nurse Communication: Mobility status;Patient requests pain meds PT Visit Diagnosis: Unsteadiness on feet (R26.81);Other abnormalities of gait and mobility (R26.89);Muscle weakness (generalized) (M62.81);Pain;Difficulty in walking, not elsewhere classified (R26.2)    Time: 0160-1093 PT Time Calculation (min) (ACUTE ONLY): 25 min   Charges:   PT Evaluation $PT Eval Low Complexity: 1 Low PT Treatments $Gait Training: 8-22 mins $Neuromuscular Re-education: 8-22 mins       Decklin Weddington PT DPT 10:15 AM,04/17/22  04/17/2022, 10:15 AM

## 2022-04-17 NOTE — Progress Notes (Signed)
SLP Cancellation Note  Patient Details Name: Stephanie [name] MRN: [id number] DOB: [birth year]   Cancelled treatment:       Reason Eval/Treat Not Completed: SLP screened, no needs identified, will sign off  Stephanie [name], M.S., CCC-SLP, CBIS Speech-Language Pathologist Certified Brain Injury Specialist [facility] [phone] Ascom [phone] Fax [phone]  Stephanie [name] [name] 04/17/2022, 11:27 AM

## 2022-04-17 NOTE — Evaluation (Signed)
Occupational Therapy Evaluation Patient Details Name: CAMRIN LAPRE MRN: [id number] DOB: Jul 08, 1966 Today's Date: 04/17/2022   History of Present Illness Melenda B Min is a 56 y.o. female with medical history significant of hypertension,CVA, DM type II, chronic headaches, nephrolithiasis who presented after being acutely altered this morning.  Patient reported that she did not feel right when she woke up this morning.  She reported feeling fatigued.  After breakfast felt like she was getting more confused and she stated that it seemed like her speech was slurred.  She complained of having a right-sided headache.  After talking to her primary care office they advised her to come to the hospital for further evaluation.  Denied having any focal weakness change in vision, fever, nausea, vomiting, or diarrhea.  She had recently been diagnosed with a UTI and treated with course of cefdinir which she completed yesterday.  She does report still having some urinary frequency as well as some suprapubic abdominal tenderness.  Records note patient is followed by.  Dr. Carlynn Herald of neurology for these complex partial spells of impaired gait, impaired speech, impaired cognition with memory loss concerning for seizure for which she has been on lamotrigine and Keppra.     Upon admission into the emergency department patient was noted to have stable vital signs.  Labs significant for potassium 3.4, glucose 244, and anion gap of 11.  Urinalysis was positive for large leukocytes rare bacteria, and greater than 50 WBCs.  CT scan of the head did not note any acute abnormality.  Renal CT noted no acute abnormality with bilateral nephrolithiasis unchanged with stable small right renal cyst.  Patient had been given 2 g of Rocephin IV and Haldol.   Clinical Impression   Patient seen for OT evaluation this date. Pt presents with pain, decreased activity tolerance, and impaired balance. At baseline, pt was independent with ADLs,  receives assistance for IADLs, and independent for functional mobility in the home. Pt able to complete all activities this date with supervision including functional transfers, toileting hygiene, and grooming tasks. Required Min guard during functional mobility at times due to IV pole/tele management. Pt appears to be close to baseline for self-care tasks. Patient will benefit from acute skilled OT services while in this hospital to increase overall independence in the areas of ADLs and functional mobility in order to safely discharge home. No OT follow up indicated at this time.  To assess cognition further, OT administered SLUMS. 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 Of note, it is not within occupational therapy scope of practice to diagnose cognitive impairments, this screen indicates need for further testing. The SLUMS is a 30 point, 11 question screening questionnaire that tests orientation, memory, attention, and executive function. Based on this assessment, pt with noted impairments in short term memory, problem solving, and executive function which do not seem to be limiting performance in ADLs. Husband has been assisting pt with higher level IADLs.        Recommendations for follow up therapy are one component of a multi-disciplinary discharge planning process, led by the attending physician.  Recommendations may be updated based on patient status, additional functional criteria and insurance authorization.   Follow Up Recommendations  No OT follow up    Assistance Recommended at Discharge Intermittent Supervision/Assistance  Patient can return home with the following Assistance with cooking/housework;Assist for transportation;A little help with walking and/or transfers    Functional Status Assessment  Patient has had a  recent decline in their functional status and demonstrates the ability to make significant improvements in  function in a reasonable and predictable amount of time.  Equipment Recommendations  None recommended by OT           Precautions / Restrictions Precautions Precautions: Fall Restrictions Weight Bearing Restrictions: No      Mobility Bed Mobility Overal bed mobility:  (received in recliner)               Patient Response: Cooperative, Flat affect  Transfers Overall transfer level: Needs assistance Equipment used: Rolling walker (2 wheels), None (RW initially then no AD) Transfers: Sit to/from Stand Sit to Stand: Supervision                  Balance Overall balance assessment: Mild deficits observed, not formally tested Sitting-balance support: Feet unsupported Sitting balance-Leahy Scale: Good     Standing balance support: Single extremity supported Standing balance-Leahy Scale: Good                             ADL either performed or assessed with clinical judgement   ADL Overall ADL's : Needs assistance/impaired     Grooming: Wash/dry hands;Standing;Supervision/safety Grooming Details (indicate cue type and reason): Pt completed hand hygiene while standing at sink                 Toilet Transfer: Supervision/safety;Regular Social worker and Hygiene: Supervision/safety;Sit to/from stand       Functional mobility during ADLs: Rolling walker (2 wheels);Min guard;Supervision/safety (Initially Min guard due to IV pole/tele management with RW then progressed to supervision without an AD)       Vision Patient Visual Report: No change from baseline                  Pertinent Vitals/Pain Pain Assessment Pain Assessment: Faces Faces Pain Scale: Hurts little more Pain Descriptors / Indicators: Headache Pain Intervention(s): Patient requesting pain meds-RN notified, Monitored during session     Hand Dominance Right   Extremity/Trunk Assessment Upper Extremity Assessment Upper Extremity  Assessment: Overall WFL for tasks assessed   Lower Extremity Assessment Lower Extremity Assessment: Defer to PT evaluation LLE Deficits / Details: 3+/5 in hip and knee, 4/5 L ankle, RLE 4/5 grosssly LLE Sensation: WNL LLE Coordination: WNL;decreased gross motor   Cervical / Trunk Assessment Cervical / Trunk Assessment: Normal   Communication Communication Communication: No difficulties   Cognition Arousal/Alertness: Awake/alert Behavior During Therapy: WFL for tasks assessed/performed Overall Cognitive Status: Within Functional Limits for tasks assessed                                 General Comments: slurred speech from past CVA, pt denies feeling "off" anymore, reports that cognition feels back to baseline     General Comments  Pt denied any numbness/tingling in hands or feet               Home Living Family/patient expects to be discharged to:: Private residence Living Arrangements: Spouse/significant other Available Help at Discharge: Family;Available PRN/intermittently Type of Home: Mobile home Home Access: Stairs to enter Entrance Stairs-Number of Steps: 3 rear and 5 front Entrance Stairs-Rails: Right;Left Home Layout: One level     Bathroom Shower/Tub: Chief Strategy Officer: Standard Bathroom Accessibility: Yes   Home Equipment: Cane - single point;Hand held shower  head   Additional Comments: Hand held shower      Prior Functioning/Environment Prior Level of Function : Independent/Modified Independent             Mobility Comments: Pt reports being independent with HH distances and no AD, uses SPC in the community (i.e. grocery store, appointments). ADLs Comments: Pt reports being independent with ADLs (bathing, dressing) and receives assistance for IADLs (pt does laundry; husband cooks, cleans, and drives). Pt reports 2 falls in the past 6 months. Pt has 5 dogs. Pt does not work, husband works 12 hr shifts.        OT  Problem List: Pain;Impaired balance (sitting and/or standing);Decreased activity tolerance      OT Treatment/Interventions: Self-care/ADL training;Patient/family education;Energy conservation;Therapeutic exercise;Therapeutic activities    OT Goals(Current goals can be found in the care plan section) Acute Rehab OT Goals Patient Stated Goal: return home OT Goal Formulation: With patient Time For Goal Achievement: 05/01/22 Potential to Achieve Goals: Good ADL Goals Pt Will Perform Grooming: Independently;standing Pt Will Perform Upper Body Dressing: Independently;sitting Pt Will Perform Lower Body Dressing: Independently;sitting/lateral leans;sit to/from stand Pt Will Transfer to Toilet: Independently;regular height toilet Pt Will Perform Toileting - Clothing Manipulation and hygiene: Independently;sit to/from stand  OT Frequency: Min 2X/week                  AM-PAC OT "6 Clicks" Daily Activity     Outcome Measure Help from another person eating meals?: None Help from another person taking care of personal grooming?: None Help from another person toileting, which includes using toliet, bedpan, or urinal?: A Little Help from another person bathing (including washing, rinsing, drying)?: A Little Help from another person to put on and taking off regular upper body clothing?: None Help from another person to put on and taking off regular lower body clothing?: A Little 6 Click Score: 21   End of Session Equipment Utilized During Treatment: Gait belt;Rolling walker (2 wheels) Nurse Communication: Mobility status;Patient requests pain meds  Activity Tolerance: Patient tolerated treatment well Patient left: in chair;with call bell/phone within reach  OT Visit Diagnosis: History of falling (Z91.81);Pain;Other abnormalities of gait and mobility (R26.89) Pain - part of body:  (headache)                Time: 1030-1100 OT Time Calculation (min): 30 min Charges:  OT General Charges $OT  Visit: 1 Visit OT Evaluation $OT Eval Low Complexity: 1 Low  [facility] MS, OTR/L ascom [phone]  04/17/22, 12:55 PM

## 2022-04-17 NOTE — Progress Notes (Signed)
Discharge instructions reviewed with patient including followup visits and medication changes.  Understanding was verbalized and all questions were answered.  IV removed without complication; patient tolerated well.  Patient discharged home via wheelchair in stable condition escorted by nursing staff.  

## 2022-04-17 NOTE — Plan of Care (Signed)
°  Problem: Clinical Measurements: °Goal: Ability to maintain clinical measurements within normal limits will improve °Outcome: Progressing °  °Problem: Activity: °Goal: Risk for activity intolerance will decrease °Outcome: Progressing °  °Problem: Nutrition: °Goal: Adequate nutrition will be maintained °Outcome: Progressing °  °

## 2022-04-17 NOTE — Procedures (Signed)
History: 56 yo F with spells of AMS  Sedation: None  Technique: This EEG was acquired with electrodes placed according to the International 10-20 electrode system (including Fp1, Fp2, F3, F4, C3, C4, P3, P4, O1, O2, T3, T4, T5, T6, A1, A2, Fz, Cz, Pz). The following electrodes were missing or displaced: none.   Background: The background consists of intermixed alpha and beta activities. There is a well defined posterior dominant rhythm of 8 Hz that attenuates with eye opening. Sleep is recorded with normal appearing structures.   Photic stimulation: Physiologic driving is present  EEG Abnormalities: none  Clinical Interpretation: This normal EEG is recorded in the waking and sleep state. There was no seizure or seizure predisposition recorded on this study. Please note that lack of epileptiform activity on EEG does not preclude the possibility of epilepsy.   Ritta Slot, MD [facility] [phone]  If 7pm- 7am, please page neurology on call as listed in [facility].

## 2022-04-18 ENCOUNTER — Ambulatory Visit: Payer: Medicaid Other | Admitting: Urology

## 2022-04-18 DIAGNOSIS — Z7689 Persons encountering health services in other specified circumstances: Secondary | ICD-10-CM | POA: Diagnosis not present

## 2022-04-19 ENCOUNTER — Encounter: Payer: Self-pay | Admitting: Urology

## 2022-04-19 LAB — URINE CULTURE: Culture: 10000 — AB

## 2022-04-23 ENCOUNTER — Other Ambulatory Visit: Payer: Self-pay | Admitting: Pharmacist

## 2022-04-23 NOTE — Chronic Care Management (AMB) (Signed)
Care Coordination:  Contacted patient for scheduled follow up. Her daughter, Alvis Lemmings, manages her medications. Attempted to call Dawn but she was still asleep.   Called patient. She notes continued concerns about UTIs, though she missed the scheduled appointment with [facility] last week. Also reports concerns about her blood sugars and blood pressures. Reports her fasting blood sugar this morning was 155, then she ate a banana and checked later, and it was 245. She reports blood pressures have been 100-110s/70-80s lately.   Reports she feels tired and fatigued. Brings up a history of Hepatitis B, though upon chart review, appears it was Hepatitis C.   Attempted to call Dawn to review medications and notify to contact [facility] to reschedule. Will try back later this week.   Patient is scheduled for a colonoscopy at [facility] later this week per Care Everywhere.   Catie Eppie Gibson, PharmD, [facility] [phone]

## 2022-04-26 DIAGNOSIS — K2100 Gastro-esophageal reflux disease with esophagitis, without bleeding: Secondary | ICD-10-CM | POA: Diagnosis not present

## 2022-04-26 DIAGNOSIS — K635 Polyp of colon: Secondary | ICD-10-CM | POA: Diagnosis not present

## 2022-04-26 DIAGNOSIS — Z86010 Personal history of colonic polyps: Secondary | ICD-10-CM | POA: Diagnosis not present

## 2022-04-26 DIAGNOSIS — K5730 Diverticulosis of large intestine without perforation or abscess without bleeding: Secondary | ICD-10-CM | POA: Diagnosis not present

## 2022-04-26 DIAGNOSIS — E785 Hyperlipidemia, unspecified: Secondary | ICD-10-CM | POA: Diagnosis not present

## 2022-04-26 DIAGNOSIS — Z7984 Long term (current) use of oral hypoglycemic drugs: Secondary | ICD-10-CM | POA: Diagnosis not present

## 2022-04-26 DIAGNOSIS — E119 Type 2 diabetes mellitus without complications: Secondary | ICD-10-CM | POA: Diagnosis not present

## 2022-04-26 DIAGNOSIS — F419 Anxiety disorder, unspecified: Secondary | ICD-10-CM | POA: Diagnosis not present

## 2022-04-26 DIAGNOSIS — D128 Benign neoplasm of rectum: Secondary | ICD-10-CM | POA: Diagnosis not present

## 2022-04-26 DIAGNOSIS — F4310 Post-traumatic stress disorder, unspecified: Secondary | ICD-10-CM | POA: Diagnosis not present

## 2022-04-26 DIAGNOSIS — Z888 Allergy status to other drugs, medicaments and biological substances status: Secondary | ICD-10-CM | POA: Diagnosis not present

## 2022-04-26 DIAGNOSIS — I10 Essential (primary) hypertension: Secondary | ICD-10-CM | POA: Diagnosis not present

## 2022-04-26 DIAGNOSIS — D123 Benign neoplasm of transverse colon: Secondary | ICD-10-CM | POA: Diagnosis not present

## 2022-04-26 DIAGNOSIS — Z1211 Encounter for screening for malignant neoplasm of colon: Secondary | ICD-10-CM | POA: Diagnosis not present

## 2022-04-29 DIAGNOSIS — R69 Illness, unspecified: Secondary | ICD-10-CM | POA: Diagnosis not present

## 2022-04-29 DIAGNOSIS — F410 Panic disorder [episodic paroxysmal anxiety] without agoraphobia: Secondary | ICD-10-CM | POA: Diagnosis not present

## 2022-04-29 DIAGNOSIS — F319 Bipolar disorder, unspecified: Secondary | ICD-10-CM | POA: Diagnosis not present

## 2022-04-29 DIAGNOSIS — F4312 Post-traumatic stress disorder, chronic: Secondary | ICD-10-CM | POA: Diagnosis not present

## 2022-04-29 DIAGNOSIS — F411 Generalized anxiety disorder: Secondary | ICD-10-CM | POA: Diagnosis not present

## 2022-04-30 ENCOUNTER — Other Ambulatory Visit: Payer: Self-pay | Admitting: Emergency Medicine

## 2022-04-30 DIAGNOSIS — M7989 Other specified soft tissue disorders: Secondary | ICD-10-CM

## 2022-04-30 MED ORDER — FUROSEMIDE 40 MG PO TABS: 40.0000 mg | ORAL_TABLET | Freq: Two times a day (BID) | ORAL | 3 refills | Status: DC | PRN

## 2022-04-30 NOTE — Discharge Summary (Signed)
Physician Discharge Summary   Patient: Stephanie [name] MRN: [id number] DOB: Mar 22, 1966  Admit date:     04/16/2022  Discharge date: 04/17/2022  Discharge Physician: Pennie Banter   PCP: Margarita Mail, DO   Recommendations at discharge:    Follow up with Primary Care  Discharge Diagnoses: Principal Problem:   Acute encephalopathy Active Problems:   UTI (urinary tract infection)   Type 2 diabetes mellitus (HCC)   History of CVA (cerebrovascular accident)   Hyperlipidemia, unspecified   Tobacco use disorder   Hypokalemia  Resolved Problems:   * No resolved hospital problems. [facility] Course: HPI on admission: "Stephanie [name] is a 56 y.o. female with medical history significant of hypertension,CVA, DM type II, chronic headaches, nephrolithiasis who presented after being acutely altered this morning.  Patient reported that she did not feel right when she woke up this morning.  She reported feeling fatigued.  After breakfast felt like she was getting more confused and she stated that it seemed like her speech was slurred.  She complained of having a right-sided headache.  After talking to her primary care office they advised her to come to the hospital for further evaluation.  Denied having any focal weakness change in vision, fever, nausea, vomiting, or diarrhea.  She had recently been diagnosed with a UTI and treated with course of cefdinir which she completed yesterday.  She does report still having some urinary frequency as well as some suprapubic abdominal tenderness.  Records note patient is followed by.  Dr. Carlynn Herald of neurology for these complex partial spells of impaired gait, impaired speech, impaired cognition with memory loss concerning for seizure for which she has been on lamotrigine and Keppra.   Upon admission into the emergency department patient was noted to have stable vital signs.  Labs significant for potassium 3.4, glucose 244, and anion gap of 11.  Urinalysis  was positive for large leukocytes rare bacteria, and greater than 50 WBCs.  CT scan of the head did not note any acute abnormality.  Renal CT noted no acute abnormality with bilateral nephrolithiasis unchanged with stable small right renal cyst.  Patient had been given 2 g of Rocephin IV and Haldol."   Admitted for observation. Evaluation of her symptoms was unrevealing, including EEG, MRI brain.  She was treated with further antibiotics for UTI.  Potassium was replaced.  Patient's mental status returned to baseline. She requested discharge home and agreeable to follow up with closely with her PCP and neurologist.        Assessment and Plan: Acute  encephalopathy Patient presented with complaints of right-sided headache with lethargy and slurred speech.  CT scan of the head did not note any acute abnormalities.  Patient is followed by Dr. Carlynn Herald of neurology for these complex partial spells of impaired gait, impaired speech, impaired cognition with memory loss concerning for seizure for which she had been treated with AEDs. -Admit to a telemetry bed -Neurochecks -Follow-up MRI of the brain -EEG unremarkable -Check TSH and UDS - unremarkable -Continue Keppra 250 mg twice daily -PT recommended home health, OT  -Plan to complete stroke work-up and consult neurology if MRI positive.   Urinary tract infection Patient had been diagnosed with a urinary tract infection, but just recently completed antibiotics.   Had just completed antibiotics.  Urinalysis positive for large leukocytes, rare bacteria, and greater than 50 WBCs.   Treated with empiric Rocephin. Pt reported being asymptomatic and had finished antibiotics. --follow culture to final,  PCP follow up   Hypokalemia - POA, resolved Acute.  Potassium 3.4 -Give potassium chloride 30 mEq p.o. x1 dose -Continue to monitor and replace as needed   Controlled diabetes type 2 with hyperglycemia On admission glucose elevated at 244.   Patient's last hemoglobin A1c was 6 on 02/13/2022.  Home medication regimen includes metformin 1000 mg twice daily. -Hypoglycemia protocol -Held metformin -CBGs before every meal with sensitive SSI -Adjust insulin regimen as needed   Hyperlipidemia -Continue Crestor   History of CVA with left posterior temporal hemorrhage in 04/2016 -Continue Plavix   GERD -On PPI   Tobacco use disorder -Continue to counsel on need of cessation of tobacco   Obesity BMI 35.48 kg/m Complicates overall care and prognosis.  Recommend lifestyle modifications including physical activity and diet for weight loss and overall long-term health.        Consultants: none Procedures performed: EEG  Disposition: Home Diet recommendation:  Discharge Diet Orders (From admission, onward)     Start     Ordered   04/17/22 0000  Diet - low sodium heart healthy        04/17/22 1625           Carb modified diet DISCHARGE MEDICATION: Allergies as of 04/17/2022       Reactions   Gabapentin Itching   Ibuprofen Itching   Sulfa Antibiotics Hives   Tylenol [acetaminophen] Itching   Aspirin Itching   Buprenorphine Hcl Itching   Carbamazepine Itching, Other (See Comments), Rash   Makes her feel like something is crawling under her skin.   Codeine Itching, Other (See Comments)   Makes her feel like something is crawling under her skin. Can take, sometimes makes her itch   Compazine Itching, Other (See Comments)   "makes my skin crawl"   Elemental Sulfur Hives, Rash, Other (See Comments)   Skin Rashes, Hives   Ioxaglate Itching   Ivp Dye [iodinated Contrast Media] Itching, Other (See Comments)   Makes her itch really bad. Had to get 2-3 shots of Benadryl when she was in the hospital.   Metrizamide Itching   Naproxen Itching   Norco [hydrocodone-acetaminophen] Itching   Other Itching, Other (See Comments)   Makes her itch really bad. Had to get 2-3 shots of Benadryl when she was in the hospital.    Penicillin G Hives, Rash, Other (See Comments)   Has patient had a PCN reaction causing immediate rash, facial/tongue/throat swelling, SOB or lightheadedness with hypotension: Yes Has patient had a PCN reaction causing severe rash involving mucus membranes or skin necrosis: No Has patient had a PCN reaction that required hospitalization: No Has patient had a PCN reaction occurring within the last 10 years: No If all of the above answers are "NO", then may proceed with Cephalosporin use.   Prochlorperazine Maleate Other (See Comments)   Compazine makes her skin crawl - needs 2-3 shots of Benadryl to get relief.   Reglan [metoclopramide] Itching, Other (See Comments)   Makes her skin crawl   Toradol [ketorolac Tromethamine] Itching   Tramadol Itching   Zofran [ondansetron Hcl] Itching        Medication List     STOP taking these medications    ALPRAZolam 1 MG tablet Commonly known as: XANAX   cefdinir 300 MG capsule Commonly known as: OMNICEF   cholecalciferol 25 MCG (1000 UNIT) tablet Commonly known as: VITAMIN D3   VITAMIN B-12 PO       TAKE these medications  albuterol 108 (90 Base) MCG/ACT inhaler Commonly known as: VENTOLIN HFA Inhale 2 puffs into the lungs every 6 (six) hours as needed for wheezing or shortness of breath.   ARIPiprazole 15 MG tablet Commonly known as: ABILIFY Take 15 mg by mouth every morning.   Breztri Aerosphere 160-9-4.8 MCG/ACT Aero Generic drug: Budeson-Glycopyrrol-Formoterol Inhale 2 puffs into the lungs 2 (two) times daily.   Caplyta 42 MG capsule Generic drug: lumateperone tosylate Take 42 mg by mouth every evening.   clopidogrel 75 MG tablet Commonly known as: PLAVIX Take 75 mg by mouth daily.   diazepam 10 MG tablet Commonly known as: VALIUM Take 10 mg by mouth 3 (three) times daily.   FreeStyle Libre 2 Sensor Misc USE WITH READER TO CHECK SUGARS DAILY   Klor-Con M10 10 MEQ tablet Generic drug: potassium chloride Take  10 mEq by mouth daily as needed (when taking furosemide).   lamoTRIgine 100 MG tablet Commonly known as: LaMICtal Take 1 tablet (100 mg total) by mouth 2 (two) times daily.   levETIRAcetam 250 MG tablet Commonly known as: KEPPRA Take 250 mg by mouth 2 (two) times daily.   metFORMIN 500 MG tablet Commonly known as: GLUCOPHAGE Take 2 tablets (1,000 mg total) by mouth 2 (two) times daily with a meal.   omeprazole 40 MG capsule Commonly known as: PRILOSEC Take 40 mg by mouth daily.   prazosin 2 MG capsule Commonly known as: MINIPRESS Take 4 mg by mouth every evening.   rosuvastatin 20 MG tablet Commonly known as: CRESTOR Take 20 mg by mouth daily.   traZODone 150 MG tablet Commonly known as: DESYREL Take 150 mg by mouth at bedtime as needed for sleep.   Trintellix 20 MG Tabs tablet Generic drug: vortioxetine HBr Take 20 mg by mouth daily.        Discharge Exam: Filed Weights   04/16/22 1213 04/16/22 2030  Weight: 88 kg 97.1 kg   General exam: awake, alert, no acute distress HEENT: atraumatic, clear conjunctiva, anicteric sclera, moist mucus membranes, hearing grossly normal  Respiratory system: CTAB, no wheezes, rales or rhonchi, normal respiratory effort. Cardiovascular system: normal S1/S2, RRR, no JVD, murmurs, rubs, gallops, no pedal edema.   Gastrointestinal system: soft, NT, ND, no HSM felt, +bowel sounds. Central nervous system: A&O x4. no gross focal neurologic deficits, normal speech Extremities: moves all, no edema, normal tone Skin: dry, intact, normal temperature, normal color,No rashes, lesions or ulcers Psychiatry: normal mood, congruent affect, judgement and insight appear normal   Condition at discharge: stable  The results of significant diagnostics from this hospitalization (including imaging, microbiology, ancillary and laboratory) are listed below for reference.   Imaging Studies: EEG adult  Result Date: 05-01-2022 Rejeana Brock, MD      01-May-2022  5:44 PM History: 56 yo F with spells of AMS Sedation: None Technique: This EEG was acquired with electrodes placed according to the International 10-20 electrode system (including Fp1, Fp2, F3, F4, C3, C4, P3, P4, O1, O2, T3, T4, T5, T6, A1, A2, Fz, Cz, Pz). The following electrodes were missing or displaced: none. Background: The background consists of intermixed alpha and beta activities. There is a well defined posterior dominant rhythm of 8 Hz that attenuates with eye opening. Sleep is recorded with normal appearing structures. Photic stimulation: Physiologic driving is present EEG Abnormalities: none Clinical Interpretation: This normal EEG is recorded in the waking and sleep state. There was no seizure or seizure predisposition recorded on this study. Please note that  lack of epileptiform activity on EEG does not preclude the possibility of epilepsy. Ritta Slot, MD [facility] [phone] If 7pm- 7am, please page neurology on call as listed in AMION.   MR BRAIN WO CONTRAST  Result Date: 04/16/2022 CLINICAL DATA:  Initial evaluation for neuro deficit, stroke suspected. EXAM: MRI HEAD WITHOUT CONTRAST TECHNIQUE: Multiplanar, multiecho pulse sequences of the brain and surrounding structures were obtained without intravenous contrast. COMPARISON:  Prior CT from earlier the same day. FINDINGS: Brain: Cerebral volume within normal limits. Few scattered subcentimeter foci of T2/FLAIR hyperintensity noted involving the periventricular and deep white matter both cerebral hemispheres, nonspecific, but most like related chronic microvascular ischemic disease. Overall, appearance is mild in nature. Encephalomalacia and gliosis involving the left temporal occipital region likely reflects a remote ischemic infarct. Associated chronic hemosiderin staining at this location. No evidence for acute or subacute ischemia. Gray-white matter differentiation otherwise maintained. No other areas  of chronic cortical infarction. No other acute or chronic intracranial blood products. No mass lesion, midline shift or mass effect. Ex vacuo dilatation of the posterior left lateral ventricle related to the chronic left temporal occipital infarct. No hydrocephalus. No extra-axial fluid collection. Pituitary gland and suprasellar region within normal limits. Vascular: Major intracranial vascular flow voids are maintained. Skull and upper cervical spine: Craniocervical junction within normal limits. Bone marrow signal intensity normal. No scalp soft tissue abnormality. Sinuses/Orbits: Globes and orbital soft tissues within normal limits. Left maxillary and right sphenoid sinus retention cysts noted. Mild scattered mucosal thickening noted about the ethmoidal air cells. No significant mastoid effusion. Other: None. IMPRESSION: 1. No acute intracranial abnormality. 2. Chronic left temporoccipital infarct. 3. Underlying mild chronic microvascular ischemic disease. Electronically Signed   By: Rise Mu M.D.   On: 04/16/2022 22:31   CT Renal Stone Study  Result Date: 04/16/2022 CLINICAL DATA:  Recent UTI post treatment. Flank pain with possible kidney stone. EXAM: CT ABDOMEN AND PELVIS WITHOUT CONTRAST TECHNIQUE: Multidetector CT imaging of the abdomen and pelvis was performed following the standard protocol without IV contrast. RADIATION DOSE REDUCTION: This exam was performed according to the departmental dose-optimization program which includes automated exposure control, adjustment of the mA and/or kV according to patient size and/or use of iterative reconstruction technique. COMPARISON:  04/06/2022 FINDINGS: Lower chest: Heart size is normal. Minimal calcified plaque over the descending thoracic aorta. Lung bases are clear. Hepatobiliary: Liver, gallbladder and biliary tree are normal. Pancreas: Normal. Spleen: Normal. Adrenals/Urinary Tract: Adrenal glands are normal. Stable mild right renal atrophy.  Several bilateral nonobstructing renal stones unchanged. Stable small right renal cysts. Ureters and bladder are normal. Stomach/Bowel: Stomach and small bowel are normal. Appendix is normal. Colon is unremarkable. Vascular/Lymphatic: Minimal calcified plaque over the abdominal aorta which is normal in caliber. No evidence of adenopathy. Reproductive: Previous hysterectomy. Other: No free fluid or focal inflammatory change. Musculoskeletal: Mild degenerative change of the spine. No focal abnormality. IMPRESSION: 1. No acute findings in the abdomen/pelvis. 2. Bilateral nephrolithiasis unchanged. Stable small right renal cysts. Stable mild right renal atrophy. 3. Aortic atherosclerosis. Aortic Atherosclerosis (ICD10-I70.0). Electronically Signed   By: Elberta Fortis M.D.   On: 04/16/2022 17:54   CT HEAD WO CONTRAST ( )  Result Date: 04/16/2022 CLINICAL DATA:  Mental status change, unknown cause EXAM: CT HEAD WITHOUT CONTRAST TECHNIQUE: Contiguous axial images were obtained from the base of the skull through the vertex without intravenous contrast. RADIATION DOSE REDUCTION: This exam was performed according to the departmental dose-optimization program which includes automated exposure  control, adjustment of the mA and/or kV according to patient size and/or use of iterative reconstruction technique. COMPARISON:  01/29/2022 FINDINGS: Brain: No evidence of acute infarction, hemorrhage, hydrocephalus, extra-axial collection or mass lesion/mass effect. Chronic left temporal-occipital lobe infarct with encephalomalacia. Vascular: No hyperdense vessel or unexpected calcification. Skull: Normal. Negative for fracture or focal lesion. Sinuses/Orbits: No acute finding. Other: None. IMPRESSION: 1. No acute intracranial abnormality. 2. Chronic left temporal-occipital lobe infarct. Electronically Signed   By: Duanne GuessNicholas  Plundo D.O.   On: 04/16/2022 16:33   CT ABDOMEN PELVIS WO CONTRAST  Result Date: 04/06/2022 CLINICAL  DATA:  56 year old female with history of abdominal pain and back pain. EXAM: CT ABDOMEN AND PELVIS WITHOUT CONTRAST TECHNIQUE: Multidetector CT imaging of the abdomen and pelvis was performed following the standard protocol without IV contrast. RADIATION DOSE REDUCTION: This exam was performed according to the departmental dose-optimization program which includes automated exposure control, adjustment of the mA and/or kV according to patient size and/or use of iterative reconstruction technique. COMPARISON:  CT the abdomen and pelvis 02/17/2022. FINDINGS: Lower chest: Atherosclerotic calcifications in the descending thoracic aorta. Hepatobiliary: Diffuse low attenuation throughout the hepatic parenchyma, indicative of a background of hepatic steatosis. No suspicious cystic or solid hepatic lesions are confidently identified on today's noncontrast CT examination. Gallbladder is nearly completely decompressed, but otherwise unremarkable in appearance. Pancreas: No definite pancreatic mass or peripancreatic fluid collections or inflammatory changes. Spleen: Unremarkable. Adrenals/Urinary Tract: Multiple nonobstructive calculi are noted within the collecting systems of both kidneys measuring up to 5 mm in the upper pole collecting system of the left kidney. Mild right renal atrophy with multifocal cortical thinning, presumably areas of chronic post infectious or inflammatory scarring. No definite suspicious renal lesions confidently identified on today's noncontrast CT examination. No calcifications are identified along the course of either ureter or within the lumen of the urinary bladder. No hydroureteronephrosis. Urinary bladder is nearly completely decompressed, but otherwise unremarkable in appearance. Bilateral adrenal glands are normal in appearance. Stomach/Bowel: Unenhanced appearance of the stomach is normal. There is no pathologic dilatation of small bowel or colon. Numerous colonic diverticulae are noted,  particularly in the sigmoid colon, without surrounding inflammatory changes to suggest an acute diverticulitis at this time. Normal appendix. Vascular/Lymphatic: Atherosclerosis in the abdominal aorta and pelvic vasculature. No lymphadenopathy noted in the abdomen or pelvis. Reproductive: Status post hysterectomy. Ovaries are not confidently identified may be surgically absent or atrophic. Other: No significant volume of ascites.  No pneumoperitoneum. Musculoskeletal: There are no aggressive appearing lytic or blastic lesions noted in the visualized portions of the skeleton. IMPRESSION: 1. Multiple nonobstructive calculi are noted within the collecting systems of both kidneys. No ureteral stones or findings of urinary tract obstruction are noted at this time. 2. Hepatic steatosis. 3. Colonic diverticulosis without evidence of acute diverticulitis at this time. 4. Aortic atherosclerosis. Electronically Signed   By: Trudie Reedaniel  Entrikin M.D.   On: 04/06/2022 10:16    Microbiology: Results for orders placed or performed during the hospital encounter of 04/16/22  Urine Culture     Status: Abnormal   Collection Time: 04/16/22  1:35 PM   Specimen: Urine, Random  Result Value Ref Range Status   Specimen Description   Final    URINE, RANDOM Performed at [facility], [address] [place], [place] [address]    Special Requests   Final    NONE Performed at [facility], [address] [place], [place] [address]    Culture 10,000 COLONIES/mL  ENTEROCOCCUS FAECIUM (A)  Final   Report Status 04/19/2022 FINAL  Final   Organism ID, Bacteria ENTEROCOCCUS FAECIUM (A)  Final      Susceptibility   Enterococcus faecium - MIC*    AMPICILLIN 8 SENSITIVE Sensitive     NITROFURANTOIN 64 INTERMEDIATE Intermediate     VANCOMYCIN <=0.5 SENSITIVE Sensitive     * 10,000 COLONIES/mL ENTEROCOCCUS FAECIUM    Labs: CBC: No results for input(s): "WBC", "NEUTROABS", "HGB", "HCT", "MCV", "PLT" in the  last 168 hours. Basic Metabolic Panel: No results for input(s): "NA", "K", "CL", "CO2", "GLUCOSE", "BUN", "CREATININE", "CALCIUM", "MG", "PHOS" in the last 168 hours. Liver Function Tests: No results for input(s): "AST", "ALT", "ALKPHOS", "BILITOT", "PROT", "ALBUMIN" in the last 168 hours. CBG: No results for input(s): "GLUCAP" in the last 168 hours.  Discharge time spent: greater than 30 minutes.  Signed: Pennie Banter, DO [facility] 04/30/2022

## 2022-05-01 ENCOUNTER — Other Ambulatory Visit: Payer: Self-pay | Admitting: Pharmacist

## 2022-05-01 ENCOUNTER — Telehealth: Payer: Self-pay | Admitting: Pharmacist

## 2022-05-01 ENCOUNTER — Ambulatory Visit: Payer: Medicaid Other | Admitting: Internal Medicine

## 2022-05-01 NOTE — Chronic Care Management (AMB) (Signed)
Chief Complaint  Patient presents with   Hypertension   High Risk Managed Medicaid    Stephanie [name] is a 56 y.o. year old female who presented for a telephone visit.   They were referred to the pharmacist by their High Risk Managed Medicaid Care Team  for assistance in managing complex medication management.   Patient is participating in a Managed Medicaid Plan:  Yes  Subjective:  Care Team: Primary Care Provider: Margarita Mail, DO ; Next Scheduled Visit: 05/06/22 Urology: Dannette Barbara; Next Scheduled Visit: 05/06/22  Medication Access/Adherence  Current Pharmacy:  [facility] [address] [place] - [address] [place] [address] Phone: [phone] Fax: [phone]   Patient reports affordability concerns with their medications: No  Patient reports access/transportation concerns to their pharmacy: No  Patient reports adherence concerns with their medications:  No    Dawn reiterates that patient's husband fills medications, brings them to her, she fills the weekly pill box and husband reminds patient to take her medications twice daily.   Reports that patient's son in law generally takes her to appointments as he has the most flexible work schedule. They are trying to better coordinate between son in law, husband, and Dawn to discuss results of appointments, as patient is unable to consistently recount instructions.   Dawn reports that she worries that the patient is often unaware of s/sx UTIs before they progress to the point of causing confusion  Diabetes:   Current medications: metformin 1000 mg twice daily Medications tried in the past: glipizide, but hypoglycemia    Hypertension/Edema:   Current medications:  furosemide 40 mg twice daily with potassium 10 mEq daily   Hyperlipidemia/ASCVD Risk Reduction   Current lipid lowering medications: rosuvastatin 20 mg daily   Antiplatelet regimen: clopidogrel 75 mg daily    Bipolar Disorder: Followed by Zorita Pang  Current medications: Trintellix 20 mg daily, aripiprazole 15 mg daily, Capyta 42 mg daily, diazepam 10 mg three times daily - though Dawn reports that she thinks patient takes twice daily; lamotrigine 100 mg twice daily, prazosin 4 mg QPM Seizure disorder: levetiracetam 250 mg twice daily   Dawn reports that patient will not let her manage diazepam, she keeps that prescription to herself.    Acid Reflux: Current medications: omeprazole 40 mg daily   COPD: Current medications: Breztri 2 puffs twice daily; albuterol HFA PRN     Health Maintenance  Health Maintenance Due  Topic Date Due   COVID-19 Vaccine (2 - Janssen risk series) 08/25/2020   INFLUENZA VACCINE  04/09/2022     Objective: Lab Results  Component Value Date   HGBA1C 6.0 (H) 02/13/2022    Lab Results  Component Value Date   CREATININE 0.65 04/17/2022   BUN 16 04/17/2022   NA 143 04/17/2022   K 3.1 (L) 04/17/2022   CL 104 04/17/2022   CO2 29 04/17/2022    Lab Results  Component Value Date   CHOL 123 02/13/2022   HDL 73 02/13/2022   LDLCALC 35 02/13/2022   TRIG 69 02/13/2022   CHOLHDL 1.7 02/13/2022    Medications Reviewed Today     Reviewed by Alden Hipp, RPH-CPP (Pharmacist) on 05/01/22 at 1234  Med List Status: <None>   Medication Order Taking? Sig Documenting Provider Last Dose Status Informant  albuterol (VENTOLIN HFA) 108 (90 Base) MCG/ACT inhaler [id number] Yes Inhale 2 puffs into the lungs every 6 (six) hours as needed for wheezing or shortness of breath. Rumball,  Darl Householder, DO Taking Active Self, Multiple Informants  ARIPiprazole (ABILIFY) 15 MG tablet [id number] Yes Take 15 mg by mouth every morning. [provider] Taking Active Self, Multiple Informants  BREZTRI AEROSPHERE 160-9-4.8 MCG/ACT AERO [id number] Yes Inhale 2 puffs into the lungs 2 (two) times daily. Caro Laroche, DO Taking Active Self, Multiple Informants  CAPLYTA  42 MG capsule [id number] Yes Take 42 mg by mouth every evening. [provider] Taking Active Self, Multiple Informants  clopidogrel (PLAVIX) 75 MG tablet [id number] Yes Take 75 mg by mouth daily. [provider] Taking Active Self, Multiple Informants  Continuous Blood Gluc Sensor (FREESTYLE LIBRE 2 SENSOR) MISC [id number]  USE WITH READER TO CHECK SUGARS DAILY [provider]  Active Self, Multiple Informants  diazepam (VALIUM) 10 MG tablet [id number] Yes Take 10 mg by mouth 3 (three) times daily. [provider] Taking Active Self, Multiple Informants           Med Note Clearance Coots, Simcha Farrington T   Wed May 01, 2022  9:50 AM) Twice daily  furosemide (LASIX) 40 MG tablet [id number] Yes Take 1 tablet (40 mg total) by mouth 2 (two) times daily as needed for edema (take 40 mg in the morning, can take additional 40 mg in afternoon if needed for edema). Berniece Salines, FNP Taking Active   KLOR-CON M10 10 MEQ tablet [id number] Yes Take 10 mEq by mouth daily as needed (when taking furosemide). [provider] Taking Active Self, Multiple Informants  lamoTRIgine (LAMICTAL) 100 MG tablet [id number] Yes Take 1 tablet (100 mg total) by mouth 2 (two) times daily. York Spaniel, MD Taking Active Self, Multiple Informants  levETIRAcetam (KEPPRA) 250 MG tablet [id number] Yes Take 250 mg by mouth 2 (two) times daily. [provider] Taking Active Self, Multiple Informants  metFORMIN (GLUCOPHAGE) 500 MG tablet [id number] Yes Take 2 tablets (1,000 mg total) by mouth 2 (two) times daily with a meal. [name], PA-C Taking Active Self, Multiple Informants  omeprazole (PRILOSEC) 40 MG capsule [id number] Yes Take 40 mg by mouth daily. [provider] Taking Active Self, Multiple Informants  prazosin (MINIPRESS) 2 MG capsule [id number] Yes Take 4 mg by mouth every evening. [provider] Taking Active Self, Multiple Informants  rosuvastatin (CRESTOR) 20 MG  tablet [id number] Yes Take 20 mg by mouth daily. [provider] Taking Active Self, Multiple Informants  traZODone (DESYREL) 150 MG tablet [id number] Yes Take 150 mg by mouth at bedtime as needed for sleep.  [provider] Taking Active Self, Multiple Informants  TRINTELLIX 20 MG TABS tablet [id number] Yes Take 20 mg by mouth daily. [provider] Taking Active Self, Multiple Informants  Med List Note Judeth Horn, CPhT 04/17/22 1014): Elba Barman Friend   [phone]               Assessment/Plan:   Diabetes: - Reviewed with Dawn that patient initially reported to me that her blood glucose last week was "over 200", but through further discussion Viona noted that her fasting glucose was 155 and her glucose right after eating a banana was 245. Dawn also notes that patient often does not accurately portray her diet to team members.  - Encouraged continued collaboration with interdiscplinary team.   Hypertension/Edema: - Controlled - Recommended to continue current regimen at this time   Hyperlipidemia/ASCVD Risk Reduction - Controlled - Recommended to continue current regimen at this time   Bipolar Disorder/Seizure: - Managed by psychiatry. Recommend to continue to collaborate with  psychiatry with goal of reducing of sedating medications.    Acid Reflux: - Recommended to continue current regimen at this time   COPD: - Counseled Dawn on the difference between maintenance and rescue regimen. Recommend to start taking Breztri twice daily.   Follow Up Plan: will hand off to RN CM for care coordination  Catie TClearance Coots, PharmD, [facility] [phone]

## 2022-05-01 NOTE — Telephone Encounter (Signed)
Dawn called back. See other note.   Catie Eppie Gibson, PharmD, [facility] [phone]

## 2022-05-01 NOTE — Telephone Encounter (Signed)
Attempted to call patient's caregiver, Alvis Lemmings, to discuss medications. Left voicemail for her to return my call at her convenience.   Catie Eppie Gibson, PharmD, [facility] [phone]

## 2022-05-06 ENCOUNTER — Encounter: Payer: Self-pay | Admitting: Physician Assistant

## 2022-05-06 ENCOUNTER — Encounter: Payer: Self-pay | Admitting: Internal Medicine

## 2022-05-06 ENCOUNTER — Other Ambulatory Visit (HOSPITAL_COMMUNITY)
Admission: RE | Admit: 2022-05-06 | Discharge: 2022-05-06 | Disposition: A | Discharge status: Home / Self Care | Payer: Medicaid Other | Source: Ambulatory Visit | Attending: Internal Medicine | Admitting: Internal Medicine

## 2022-05-06 ENCOUNTER — Ambulatory Visit: Payer: Medicaid Other | Admitting: Physician Assistant

## 2022-05-06 ENCOUNTER — Ambulatory Visit (INDEPENDENT_AMBULATORY_CARE_PROVIDER_SITE_OTHER): Payer: Medicaid Other | Admitting: Internal Medicine

## 2022-05-06 VITALS — BP 120/76 | HR 113 | Temp 98.2°F | Resp 16 | Ht 62.0 in | Wt 196.0 lb

## 2022-05-06 DIAGNOSIS — E785 Hyperlipidemia, unspecified: Secondary | ICD-10-CM | POA: Diagnosis not present

## 2022-05-06 DIAGNOSIS — J449 Chronic obstructive pulmonary disease, unspecified: Secondary | ICD-10-CM

## 2022-05-06 DIAGNOSIS — K219 Gastro-esophageal reflux disease without esophagitis: Secondary | ICD-10-CM | POA: Diagnosis not present

## 2022-05-06 DIAGNOSIS — B379 Candidiasis, unspecified: Secondary | ICD-10-CM

## 2022-05-06 DIAGNOSIS — R300 Dysuria: Secondary | ICD-10-CM | POA: Insufficient documentation

## 2022-05-06 DIAGNOSIS — E119 Type 2 diabetes mellitus without complications: Secondary | ICD-10-CM

## 2022-05-06 DIAGNOSIS — Z23 Encounter for immunization: Secondary | ICD-10-CM

## 2022-05-06 DIAGNOSIS — N200 Calculus of kidney: Secondary | ICD-10-CM | POA: Diagnosis not present

## 2022-05-06 DIAGNOSIS — Z8673 Personal history of transient ischemic attack (TIA), and cerebral infarction without residual deficits: Secondary | ICD-10-CM | POA: Diagnosis not present

## 2022-05-06 DIAGNOSIS — Z7689 Persons encountering health services in other specified circumstances: Secondary | ICD-10-CM | POA: Diagnosis not present

## 2022-05-06 LAB — POCT URINALYSIS DIPSTICK
Bilirubin, UA: NEGATIVE
Blood, UA: POSITIVE
Glucose, UA: POSITIVE — AB
Ketones, UA: NEGATIVE
Nitrite, UA: NEGATIVE
Protein, UA: NEGATIVE
Spec Grav, UA: 1.01 (ref 1.010–1.025)
Urobilinogen, UA: 0.2 E.U./dL
pH, UA: 6.5 (ref 5.0–8.0)

## 2022-05-06 NOTE — Patient Instructions (Addendum)
It was great seeing you today!  Plan discussed at today's visit: -Urine and cervical swab today -Pneumonia vaccine today -Please bring all of your medications with you next time so we can go through them together  Follow up in: 1 month   Take care and let us know if you have any questions or concerns prior to your next visit.  Dr. Caralee Ates

## 2022-05-06 NOTE — Progress Notes (Signed)
Established Patient Office Visit  Subjective   [name]: Stephanie [name], female    DOB: 1966-09-08  Age: 56 y.o. MRN: [id number]  Chief Complaint  Patient presents with   Hospitalization Follow-up    Acute encephalopathy    HPI Patient is here for hospital follow up.   Discharge Date: 04/17/22 Diagnosis: Acute encephalopathy, UTI Procedures/tests: See below Consultants: See below  New medications: None Discontinued medications: None Status: better overall   Patient presented to the ED due to acute AMS. Was getting more and more confused and had slurred speech and right sided headache. Had been diagnosed with UTI outpatient and treated with Cefdinir, which she did complete. Does follow outpatient with Dr. 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 Labs in the ED showed potassium of 3.4, glucose 244, anion gap of 11. UA positive for leukocytes, rare bacteria and WBCs. CT head negative. Renal CT showed stable bilateral unchanged nephrolithiasis and right renal cyst. MRI brain, EEG without abnormalities. She was treated with IV abx and AMS resolved.   Patient states that she went to ER because her blood pressure was really low and her blood sugar was high at 300. Since she has been home her vitals have been stable. She still has some dysuria but denies hematuria, increased urinary urgency or frequency, abdominal pain. Also denies changes in vaginal discharge.   Diabetes, Type 2: -Last A1c 6/23 6.0% -Medications: Metformin 1000 mg BID (had been on Glipizide  -Patient is compliant with the above medications and reports no side effects.  -Checking BG at home: 170 fasting; random 200-290 -Diet: eats sausage sandwich on wheat bread, no lunch  -Eye exam: UTD 8/23 -Foot exam: UTD, will do at follow up  -Microalbumin: UTD 6/23 -Statin: yes -PNA vaccine: Due -Denies symptoms of hypoglycemia,  polyuria, polydipsia, numbness extremities, foot ulcers/trauma.   HLD/Hx of CVA: -CVA August 2017, follows with Neurology, seeing them later this week  -Medications: Crestor 20 mg not on Plavix 75 mg but it is on her list  -Patient is compliant with above medications and reports no side effects.  -Last lipid panel: Lipid Panel     Component Value Date/Time   CHOL 123 02/13/2022 1211   TRIG 69 02/13/2022 1211   HDL 73 02/13/2022 1211   CHOLHDL 1.7 02/13/2022 1211   VLDL 18 04/22/2016 0357   LDLCALC 35 02/13/2022 1211   COPD: -COPD status: stable -Current medications: Breztri 160, Albuterol PRN -Satisfied with current treatment?: yes -Oxygen use: no -Dyspnea frequency: occasional  -Cough frequency: no -Rescue inhaler frequency:  3 times daily right now but she is out of her Breztri  -Limitation of activity: yes -Productive cough: no -Pneumovax: Not up to Date -Influenza: Not up to Date -Former smoker; quit smoking 6 months ago   Bilateral Nephrolithiasis: -Chronic, following with Urology, seeing them later today  GERD: -Currently on Prilosec 40 mg daily   Depression: -Follows with Psychiatry at [facility]  -Abilify 15 mg, Valium, uncertain of other meds   Patient Active Problem List   Diagnosis Date Noted   Hypokalemia 04/17/2022   Leg swelling 02/13/2022   Wheezing 02/13/2022   Migraine 05/12/2020   Type 2 diabetes mellitus (HCC) 08/22/2018   Other specified cardiac dysrhythmias 08/22/2018   Acute encephalopathy 08/21/2018   Kidney stone 03/10/2018   UTI (urinary tract infection) 08/21/2017   History of CVA (cerebrovascular accident) 01/28/2017   History of  migraine 01/28/2017   Small bowel obstruction (HCC) 09/02/2016   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   Bipolar disorder (HCC) 05/09/2016   Hyperlipidemia, unspecified 05/09/2016   Irritable bowel syndrome 05/09/2016   Lumbago with sciatica 05/09/2016   Esophageal reflux 05/09/2016   Scoliosis 05/09/2016    Diverticulosis of colon 05/09/2016   DDD (degenerative disc disease), cervical 05/09/2016   ICH (intracerebral hemorrhage) (HCC)    Essential hypertension, malignant 04/19/2016   Cytotoxic brain edema (HCC) 04/19/2016   IVH (intraventricular hemorrhage) (HCC) 04/18/2016   Bilateral carpal tunnel syndrome 04/12/2016   Falls frequently 01/12/2016   Trigeminal neuralgia 06/08/2015   Sacroiliac dysfunction 03/04/2014   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 or radiculopathy 05/20/2013   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   Tobacco use disorder 09/11/2012   History of cervical cancer 07/30/2012   Hepatitis C    Past Medical History:  Diagnosis Date   Closed fracture of shaft of humerus 01/20/2017   Diabetes mellitus without complication (HCC)    Difficult intubation    Diverticulitis    Headache    Hepatitis C    treated and resolved   History of kidney stones    Hypertension    Hypotension 08/22/2018   IBS (irritable bowel syndrome)    Kidney stones    Sciatica    Sleep apnea    on cpap   Stroke (HCC) 2017   memory loss   Superficial venous thrombosis of arm, left 07/22/2016   Past Surgical History:  Procedure Laterality Date   ABDOMINAL HYSTERECTOMY  2007   carpel tunnel syndrome  2017   HARDWARE REMOVAL Right 12/22/2017   Procedure: HARDWARE REMOVAL-RIGHT ARM;  Surgeon: Lyndle Herrlich, MD;  Location: [facility] ORS;  Service: Orthopedics;  Laterality: Right;  Removal of implant, superficial right humerus   HUMERUS IM NAIL Right 03/11/2017   Procedure: INTRAMEDULLARY (IM) NAIL HUMERAL;  Surgeon: Lyndle Herrlich, MD;  Location: [facility] ORS;  Service: Orthopedics;  Laterality: Right;   KIDNEY STONE SURGERY Right 02/12/2012   KIDNEY STONE SURGERY Left 07/15/2012   LIGATION OF ARTERIOVENOUS  FISTULA Left 06/21/2016   Procedure: LIGATION OF ARTERIOVENOUS  FISTULA ( LIGATION BASILIC VEIN );  Surgeon: Renford Dills, MD;  Location: [facility] ORS;  Service:  Vascular;  Laterality: Left;   LITHOTRIPSY     OOPHORECTOMY     SINUS EXPLORATION     TONSILLECTOMY     Social History   Tobacco Use   Smoking status: Every Day    Packs/day: 1.00    Years: 1.50    Total pack years: 1.50    Types: Cigarettes   Smokeless tobacco: Never  Vaping Use   Vaping Use: Never used  Substance Use Topics   Alcohol use: Not Currently    Comment: no alcohol since 2002   Drug use: No   Social History   Socioeconomic History   Marital status: Legally Separated    Spouse name: Not on file   Number of children: Not on file   Years of education: Not on file   Highest education level: Not on file  Occupational History   Not on file  Tobacco Use   Smoking status: Every Day    Packs/day: 1.00    Years: 1.50    Total pack years: 1.50    Types: Cigarettes   Smokeless tobacco: Never  Vaping Use   Vaping Use: Never used  Substance and Sexual Activity   Alcohol use: Not Currently  Comment: no alcohol since 2002   Drug use: No   Sexual activity: Not on file  Other Topics Concern   Not on file  Social History Narrative   Not on file   Social Determinants of Health   Financial Resource Strain: Not on file  Food Insecurity: Not on file  Transportation Needs: Not on file  Physical Activity: Not on file  Stress: Not on file  Social Connections: Not on file  Intimate Partner Violence: Not on file   Family Status  Relation Name Status   Mother  Deceased   Father  Alive   Sister  Deceased   Family History  Problem Relation Age of Onset   Heart failure Mother    Diabetes Father    Cancer Sister    Allergies  Allergen Reactions   Gabapentin Itching   Ibuprofen Itching   Sulfa Antibiotics Hives   Tylenol [Acetaminophen] Itching   Aspirin Itching   Buprenorphine Hcl Itching   Carbamazepine Itching, Other (See Comments) and Rash    Makes her feel like something is crawling under her skin.   Codeine Itching and Other (See Comments)     Makes her feel like something is crawling under her skin. Can take, sometimes makes her itch   Compazine Itching and Other (See Comments)    "makes my skin crawl"   Elemental Sulfur Hives, Rash and Other (See Comments)    Skin Rashes, Hives   Ioxaglate Itching   Ivp Dye [Iodinated Contrast Media] Itching and Other (See Comments)    Makes her itch really bad. Had to get 2-3 shots of Benadryl when she was in the hospital.   Metrizamide Itching   Naproxen Itching   Norco [Hydrocodone-Acetaminophen] Itching   Other Itching and Other (See Comments)    Makes her itch really bad. Had to get 2-3 shots of Benadryl when she was in the hospital.   Penicillin G Hives, Rash and Other (See Comments)    Has patient had a PCN reaction causing immediate rash, facial/tongue/throat swelling, SOB or lightheadedness with hypotension: Yes Has patient had a PCN reaction causing severe rash involving mucus membranes or skin necrosis: No Has patient had a PCN reaction that required hospitalization: No Has patient had a PCN reaction occurring within the last 10 years: No If all of the above answers are "NO", then may proceed with Cephalosporin use.    Prochlorperazine Maleate Other (See Comments)    Compazine makes her skin crawl - needs 2-3 shots of Benadryl to get relief.   Reglan [Metoclopramide] Itching and Other (See Comments)    Makes her skin crawl   Toradol [Ketorolac Tromethamine] Itching   Tramadol Itching   Zofran [Ondansetron Hcl] Itching    Review of Systems  Constitutional:  Negative for chills and fever.  Respiratory:  Positive for shortness of breath. Negative for cough, sputum production and wheezing.   Cardiovascular:  Negative for chest pain.  Gastrointestinal:  Negative for abdominal pain, heartburn, nausea and vomiting.  Genitourinary:  Positive for dysuria. Negative for flank pain, frequency, hematuria and urgency.      Objective:     BP 120/76   Pulse (!) 113   Temp 98.2 F  (36.8 C)   Resp 16   Ht 5\' 2"  (1.575 m)   Wt 196 lb (88.9 kg)   SpO2 93%   BMI 35.85 kg/m  BP Readings from Last 3 Encounters:  05/06/22 120/76  04/17/22 124/72  04/09/22 104/68   Wt  Readings from Last 3 Encounters:  05/06/22 196 lb (88.9 kg)  04/16/22 214 lb 1.1 oz (97.1 kg)  03/29/22 198 lb 3.2 oz (89.9 kg)      Physical Exam Constitutional:      Appearance: Normal appearance.  HENT:     Head: Normocephalic and atraumatic.     Mouth/Throat:     Mouth: Mucous membranes are moist.     Pharynx: Oropharynx is clear.  Eyes:     Conjunctiva/sclera: Conjunctivae normal.  Cardiovascular:     Rate and Rhythm: Normal rate and regular rhythm.  Pulmonary:     Effort: Pulmonary effort is normal.     Breath sounds: Normal breath sounds.  Skin:    General: Skin is warm and dry.  Neurological:     Mental Status: She is alert. Mental status is at baseline.  Psychiatric:        Mood and Affect: Affect is flat.        Speech: Speech is delayed.        Behavior: Behavior normal.        Thought Content: Thought content normal.     No results found for any visits on 05/06/22.  Last CBC Lab Results  Component Value Date   WBC 6.2 04/17/2022   HGB 12.7 04/17/2022   HCT 39.0 04/17/2022   MCV 92.9 04/17/2022   MCH 30.2 04/17/2022   RDW 13.6 04/17/2022   PLT 154 04/17/2022   Last metabolic panel Lab Results  Component Value Date   GLUCOSE 148 (H) 04/17/2022   NA 143 04/17/2022   K 3.1 (L) 04/17/2022   CL 104 04/17/2022   CO2 29 04/17/2022   BUN 16 04/17/2022   CREATININE 0.65 04/17/2022   GFRNONAA >60 04/17/2022   CALCIUM 9.2 04/17/2022   PHOS 2.3 (L) 08/22/2018   PROT 7.4 04/16/2022   ALBUMIN 3.9 04/16/2022   BILITOT 0.5 04/16/2022   ALKPHOS 96 04/16/2022   AST 30 04/16/2022   ALT 34 04/16/2022   ANIONGAP 10 04/17/2022   Last lipids Lab Results  Component Value Date   CHOL 123 02/13/2022   HDL 73 02/13/2022   LDLCALC 35 02/13/2022   TRIG 69 02/13/2022    CHOLHDL 1.7 02/13/2022   Last hemoglobin A1c Lab Results  Component Value Date   HGBA1C 6.0 (H) 02/13/2022   Last thyroid functions Lab Results  Component Value Date   TSH 1.218 04/16/2022   Last vitamin D No results found for: "25OHVITD2", "25OHVITD3", "VD25OH" Last vitamin B12 and Folate Lab Results  Component Value Date   VITAMINB12 380 02/20/2022      The ASCVD Risk score (Arnett DK, et al., 2019) failed to calculate for the following reasons:   The valid total cholesterol range is 130 to 320 mg/dL    Assessment & Plan:   1. Burning with urination: Still having some burning despite treatment, will test for yeast infection as well.   - POCT Urinalysis Dipstick - Cervicovaginal ancillary only  2. Type 2 diabetes mellitus without complication, without long-term current use of insulin (HCC): Last A1c 6.0% but sugars higher at home lately. Will continue Metformin 1000 mg BID, follow up in 1 month for recheck A1c and foot exam.   3. History of CVA (cerebrovascular accident)/ Hyperlipidemia, unspecified hyperlipidemia type: Continue Crestor 20 mg however medication list is not correct, patient to bring all of her medications to follow up for review.   4. Vaccine for streptococcus pneumoniae and influenza: Prevnar 20 administered  today.   - Pneumococcal conjugate vaccine 20-valent (Prevnar 20)  5. Chronic obstructive pulmonary disease, unspecified COPD type (HCC): Symptoms worse but ran out of her Markus Daft, waiting for insurance to approve. A sample was given to her today. Continue Albuterol PRN.   6. Kidney stone: Stable, per renal US. Following with Urology later today.   7. Gastroesophageal reflux disease, unspecified whether esophagitis present: Stable, continue Prilosec 40 mg daily.   Return in about 4 weeks (around 06/03/2022).    Margarita Mail, DO

## 2022-05-07 LAB — URINE CULTURE
MICRO NUMBER:: 13842282
SPECIMEN QUALITY:: ADEQUATE

## 2022-05-08 ENCOUNTER — Other Ambulatory Visit: Payer: Self-pay | Admitting: *Deleted

## 2022-05-08 LAB — CERVICOVAGINAL ANCILLARY ONLY
Candida Glabrata: POSITIVE — AB
Candida Vaginitis: POSITIVE — AB
Chlamydia: NEGATIVE
Comment: NEGATIVE
Comment: NEGATIVE
Comment: NEGATIVE
Comment: NEGATIVE
Comment: NORMAL
Neisseria Gonorrhea: NEGATIVE
Trichomonas: NEGATIVE

## 2022-05-08 MED ORDER — FLUCONAZOLE 150 MG PO TABS: 150.0000 mg | ORAL_TABLET | Freq: Once | ORAL | 0 refills | Status: AC

## 2022-05-08 NOTE — Addendum Note (Signed)
Addended by: Margarita Mail on: 05/08/2022 01:56 PM   Modules accepted: Orders

## 2022-05-08 NOTE — Patient Outreach (Signed)
Medicaid Managed Care   Nurse Care Manager Note  05/08/2022 Name:  Stephanie [name] MRN:  [id number] DOB:  [birth year]  Stephanie [name] is an 56 y.o. year old female who is a primary patient of Margarita Mail, DO.  The [facility] Managed Care Coordination team was consulted for assistance with:    History CVA  [name] was given information about Medicaid Managed Care Coordination team services today. [name] Patient agreed to services and verbal consent obtained.  Engaged with patient by telephone for initial visit in response to provider referral for case management and/or care coordination services.   Assessments/Interventions:  Review of past medical history, allergies, medications, health status, including review of consultants reports, laboratory and other test data, was performed as part of comprehensive evaluation and provision of chronic care management services.  SDOH (Social Determinants of Health) assessments and interventions performed: SDOH Interventions    Flowsheet Row Most Recent Value  SDOH Interventions   Food Insecurity Interventions Intervention Not Indicated  Housing Interventions Intervention Not Indicated  Transportation Interventions Other (Comment)  [Provided with [employer] transportation]       Care Plan  Allergies  Allergen Reactions   Gabapentin Itching   Ibuprofen Itching   Sulfa Antibiotics Hives   Tylenol [Acetaminophen] Itching   Aspirin Itching   Buprenorphine Hcl Itching   Carbamazepine Itching, Other (See Comments) and Rash    Makes her feel like something is crawling under her skin.   Codeine Itching and Other (See Comments)    Makes her feel like something is crawling under her skin. Can take, sometimes makes her itch   Compazine Itching and Other (See Comments)    "makes my skin crawl"   Elemental Sulfur Hives, Rash and Other (See Comments)    Skin Rashes, Hives   Ioxaglate Itching   Ivp Dye [Iodinated Contrast  Media] Itching and Other (See Comments)    Makes her itch really bad. Had to get 2-3 shots of Benadryl when she was in the hospital.   Metrizamide Itching   Naproxen Itching   Norco [Hydrocodone-Acetaminophen] Itching   Other Itching and Other (See Comments)    Makes her itch really bad. Had to get 2-3 shots of Benadryl when she was in the hospital.   Penicillin G Hives, Rash and Other (See Comments)    Has patient had a PCN reaction causing immediate rash, facial/tongue/throat swelling, SOB or lightheadedness with hypotension: Yes Has patient had a PCN reaction causing severe rash involving mucus membranes or skin necrosis: No Has patient had a PCN reaction that required hospitalization: No Has patient had a PCN reaction occurring within the last 10 years: No If all of the above answers are "NO", then may proceed with Cephalosporin use.    Prochlorperazine Maleate Other (See Comments)    Compazine makes her skin crawl - needs 2-3 shots of Benadryl to get relief.   Reglan [Metoclopramide] Itching and Other (See Comments)    Makes her skin crawl   Toradol [Ketorolac Tromethamine] Itching   Tramadol Itching   Zofran [Ondansetron Hcl] Itching    Medications Reviewed Today     Reviewed by Heidi Dach, RN (Registered Nurse) on 05/08/22 at 1452  Med List Status: <None>   Medication Order Taking? Sig Documenting Provider Last Dose Status Informant  albuterol (VENTOLIN HFA) 108 (90 Base) MCG/ACT inhaler [id number] No Inhale 2 puffs into the lungs every 6 (six) hours as needed for wheezing or shortness of breath. Caro Laroche,  DO Taking Active Self, Multiple Informants  ARIPiprazole (ABILIFY) 15 MG tablet [id number] No Take 15 mg by mouth every morning. [provider] Taking Active Self, Multiple Informants  BREZTRI AEROSPHERE 160-9-4.8 MCG/ACT AERO [id number] No Inhale 2 puffs into the lungs 2 (two) times daily. Caro Laroche, DO Taking Active Self, Multiple Informants   CAPLYTA 42 MG capsule [id number] No Take 42 mg by mouth every evening. [provider] Taking Active Self, Multiple Informants  clopidogrel (PLAVIX) 75 MG tablet [id number] No Take 75 mg by mouth daily. [provider] Taking Active Self, Multiple Informants  Continuous Blood Gluc Sensor (FREESTYLE LIBRE 2 SENSOR) MISC [id number] No USE WITH READER TO CHECK SUGARS DAILY [provider] Taking Active Self, Multiple Informants  diazepam (VALIUM) 10 MG tablet [id number] No Take 10 mg by mouth 3 (three) times daily. [provider] Taking Active Self, Multiple Informants           Med Note Clearance Coots, [name]   Wed May 01, 2022  9:50 AM) Twice daily  fluconazole (DIFLUCAN) 150 MG tablet [id number]  Take 1 tablet (150 mg total) by mouth once for 1 dose. Margarita Mail, DO  Active   furosemide (LASIX) 40 MG tablet [id number] No Take 1 tablet (40 mg total) by mouth 2 (two) times daily as needed for edema (take 40 mg in the morning, can take additional 40 mg in afternoon if needed for edema). Berniece Salines, FNP Taking Active   KLOR-CON M10 10 MEQ tablet [id number] No Take 10 mEq by mouth daily as needed (when taking furosemide). [provider] Taking Active Self, Multiple Informants  lamoTRIgine (LAMICTAL) 100 MG tablet [id number] No Take 1 tablet (100 mg total) by mouth 2 (two) times daily. York Spaniel, MD Taking Active Self, Multiple Informants  levETIRAcetam (KEPPRA) 250 MG tablet [id number] No Take 250 mg by mouth 2 (two) times daily. [provider] Taking Active Self, Multiple Informants  metFORMIN (GLUCOPHAGE) 500 MG tablet [id number] No Take 2 tablets (1,000 mg total) by mouth 2 (two) times daily with a meal. [name], PA-C Taking Active Self, Multiple Informants  omeprazole (PRILOSEC) 40 MG capsule [id number] No Take 40 mg by mouth daily. [provider] Taking Active Self, Multiple Informants  prazosin (MINIPRESS) 2 MG capsule  [id number] No Take 4 mg by mouth every evening. [provider] Taking Active Self, Multiple Informants  rosuvastatin (CRESTOR) 20 MG tablet [id number] No Take 20 mg by mouth daily. [provider] Taking Active Self, Multiple Informants  traZODone (DESYREL) 150 MG tablet [id number] No Take 150 mg by mouth at bedtime as needed for sleep.  [provider] Taking Active Self, Multiple Informants  TRINTELLIX 20 MG TABS tablet [id number] No Take 20 mg by mouth daily. [provider] Taking Active Self, Multiple Informants  Med List Note Judeth Horn, CPhT 04/17/22 1014): Elba Barman Friend   [phone]             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   Bipolar disorder (HCC)  05/09/2016   Hyperlipidemia, unspecified 05/09/2016   Irritable bowel syndrome 05/09/2016   Lumbago with sciatica 05/09/2016   Esophageal reflux 05/09/2016   Scoliosis 05/09/2016   Diverticulosis of colon 05/09/2016   DDD (degenerative disc disease), cervical 05/09/2016   ICH (intracerebral hemorrhage) (HCC)    Essential hypertension, malignant 04/19/2016   Cytotoxic brain edema (HCC) 04/19/2016   IVH (intraventricular hemorrhage) (HCC) 04/18/2016   Bilateral carpal tunnel syndrome 04/12/2016   Falls frequently 01/12/2016   Trigeminal neuralgia 06/08/2015   Sacroiliac dysfunction 03/04/2014   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 or radiculopathy 05/20/2013   Functional abdominal pain syndrome  10/02/2012   Tobacco use disorder 09/11/2012   History of cervical cancer 07/30/2012   Hepatitis C     Conditions to be addressed/monitored per PCP order:   history CVA  Care Plan : RN Care Manager Plan of Care  Updates made by Heidi Dach, RN since 05/08/2022 12:00 AM     Problem: Development of Plan of Care to address Health Management needs related to Stroke history      Long-Range Goal: Development of Plan of Care to address Health Management needs related to Stroke history   Start Date: 05/08/2022  Expected End Date: 08/06/2022  Priority: High  Note:   Current Barriers:  Chronic Disease Management support and education needs related to Improving Health with history of Stroke  RNCM Clinical Goal(s):  Patient will verbalize understanding of plan for management of Improving Health with history of stroke as evidenced by patient reports attend all scheduled medical appointments: 05/10/22 with Spine Specialist and 9/20 with PCP,  as evidenced by provider documentation in EMR        continue to work with RN Care Manager and/or Social Worker to address care management and care coordination needs related to improving health with history of stroke as evidenced by adherence to CM Team Scheduled appointments     through collaboration with Medical illustrator, provider, and care team.   Interventions: Inter-disciplinary care team collaboration (see longitudinal plan of care) Evaluation of current treatment plan related to  self management and patient's adherence to plan as established by provider   Stroke:  (Status:New goal.) Long Term Goal Reviewed Importance of taking all medications as prescribed Reviewed Importance of attending all scheduled provider appointments Advised to report any changes in symptoms or exercise tolerance Assessed social determinant of health barriers Reviewed referrals to home health Reviewed referrals to outpatient therapy Reviewed the importance of  exercise Assessed use of tobacco use Collaborate with PCP for recommended referrals during recent admission(rolling walker and HH PT) Reviewed recent discharge instructions and admission notes Provided patient with education on fall prevention   Patient Goals/Self-Care Activities: Take medications as prescribed   Attend all scheduled provider appointments Call provider office for new concerns or questions        Follow Up:  Patient agrees to Care Plan and Follow-up.  Plan: The Managed Medicaid care management team will reach out to the patient again over the next 30 days.  Date/time of next scheduled RN care management/care coordination outreach:  06/07/22 @ 2:30pm  Estanislado Emms RN, BSN [place]  [facility] RN Care Coordinator

## 2022-05-08 NOTE — Patient Instructions (Signed)
Visit Information  [name] was given information about Medicaid Managed Care team care coordination services as a part of their [employer] [facility] [employer] Medicaid benefit. Stephanie [name] verbally consented to engagement with the [facility] Managed Care team.   If you are experiencing a medical emergency, please call 911 or report to your local emergency department or urgent care.   If you have a non-emergency medical problem during routine business hours, please contact your provider's office and ask to speak with a nurse.   For questions related to your [facility] health plan, please call: [phone] or go here:[email or web address]  If you would like to schedule transportation through your [facility] plan, please call the following number at least 2 days in advance of your appointment: [phone].  You can also use the MTM portal or MTM mobile app to manage your rides. For the portal, please go to [email or web address]  Call the [facility] Crisis Line at [phone], at any time, 24 hours a day, 7 days a week. If you are in danger or need immediate medical attention call 911.  If you would like help to quit smoking, call 1-800-QUIT-NOW ((660) 072-1438) OR Espaol: 1-855-Djelo-Ya (1-856-314-9702) o para ms informacin haga clic aqu or Text READY to 637-858 to register via text  [name],   Please see education materials related to fall prevention and how to use a walker provided by MyChart link.  Patient verbalizes understanding of instructions and care plan provided today and agrees to view in MyChart. Active MyChart status and patient understanding of how to access instructions and care plan via MyChart confirmed with patient.     Telephone follow up appointment with Managed Medicaid care management team member scheduled for:06/07/22 @ 2:30pm  Estanislado Emms RN, BSN [place]  [facility] RN Care Coordinator   Following is a copy of your plan of  care:  Care Plan : RN Care Manager Plan of Care  Updates made by Heidi Dach, RN since 05/08/2022 12:00 AM     Problem: Development of Plan of Care to address Health Management needs related to Stroke history      Long-Range Goal: Development of Plan of Care to address Health Management needs related to Stroke history   Start Date: 05/08/2022  Expected End Date: 08/06/2022  Priority: High  Note:   Current Barriers:  Chronic Disease Management support and education needs related to Improving Health with history of Stroke  RNCM Clinical Goal(s):  Patient will verbalize understanding of plan for management of Improving Health with history of stroke as evidenced by patient reports attend all scheduled medical appointments: 05/10/22 with Spine Specialist and 9/20 with PCP,  as evidenced by provider documentation in EMR        continue to work with RN Care Manager and/or Social Worker to address care management and care coordination needs related to improving health with history of stroke as evidenced by adherence to CM Team Scheduled appointments     through collaboration with Medical illustrator, provider, and care team.   Interventions: Inter-disciplinary care team collaboration (see longitudinal plan of care) Evaluation of current treatment plan related to  self management and patient's adherence to plan as established by provider   Stroke:  (Status:New goal.) Long Term Goal Reviewed Importance of taking all medications as prescribed Reviewed Importance of attending all scheduled provider appointments Advised to report any changes in symptoms or exercise tolerance Assessed social determinant of health barriers Reviewed referrals to home health Reviewed referrals to outpatient  therapy Reviewed the importance of exercise Assessed use of tobacco use Collaborate with PCP for recommended referrals during recent admission(rolling walker and HH PT) Reviewed recent discharge instructions and  admission notes Provided patient with education on fall prevention   Patient Goals/Self-Care Activities: Take medications as prescribed   Attend all scheduled provider appointments Call provider office for new concerns or questions

## 2022-05-10 DIAGNOSIS — Z419 Encounter for procedure for purposes other than remedying health state, unspecified: Secondary | ICD-10-CM | POA: Diagnosis not present

## 2022-05-14 ENCOUNTER — Other Ambulatory Visit: Payer: Self-pay | Admitting: Internal Medicine

## 2022-05-14 DIAGNOSIS — Z8673 Personal history of transient ischemic attack (TIA), and cerebral infarction without residual deficits: Secondary | ICD-10-CM

## 2022-05-14 DIAGNOSIS — R262 Difficulty in walking, not elsewhere classified: Secondary | ICD-10-CM

## 2022-05-21 IMAGING — CR DG CHEST 2V
2 series · 2 of 2 positions shown · non-contrast
Comparison: 06/16/2019

CLINICAL DATA: Chest pain

EXAM:
CHEST - 2 VIEW

[w chest pa]
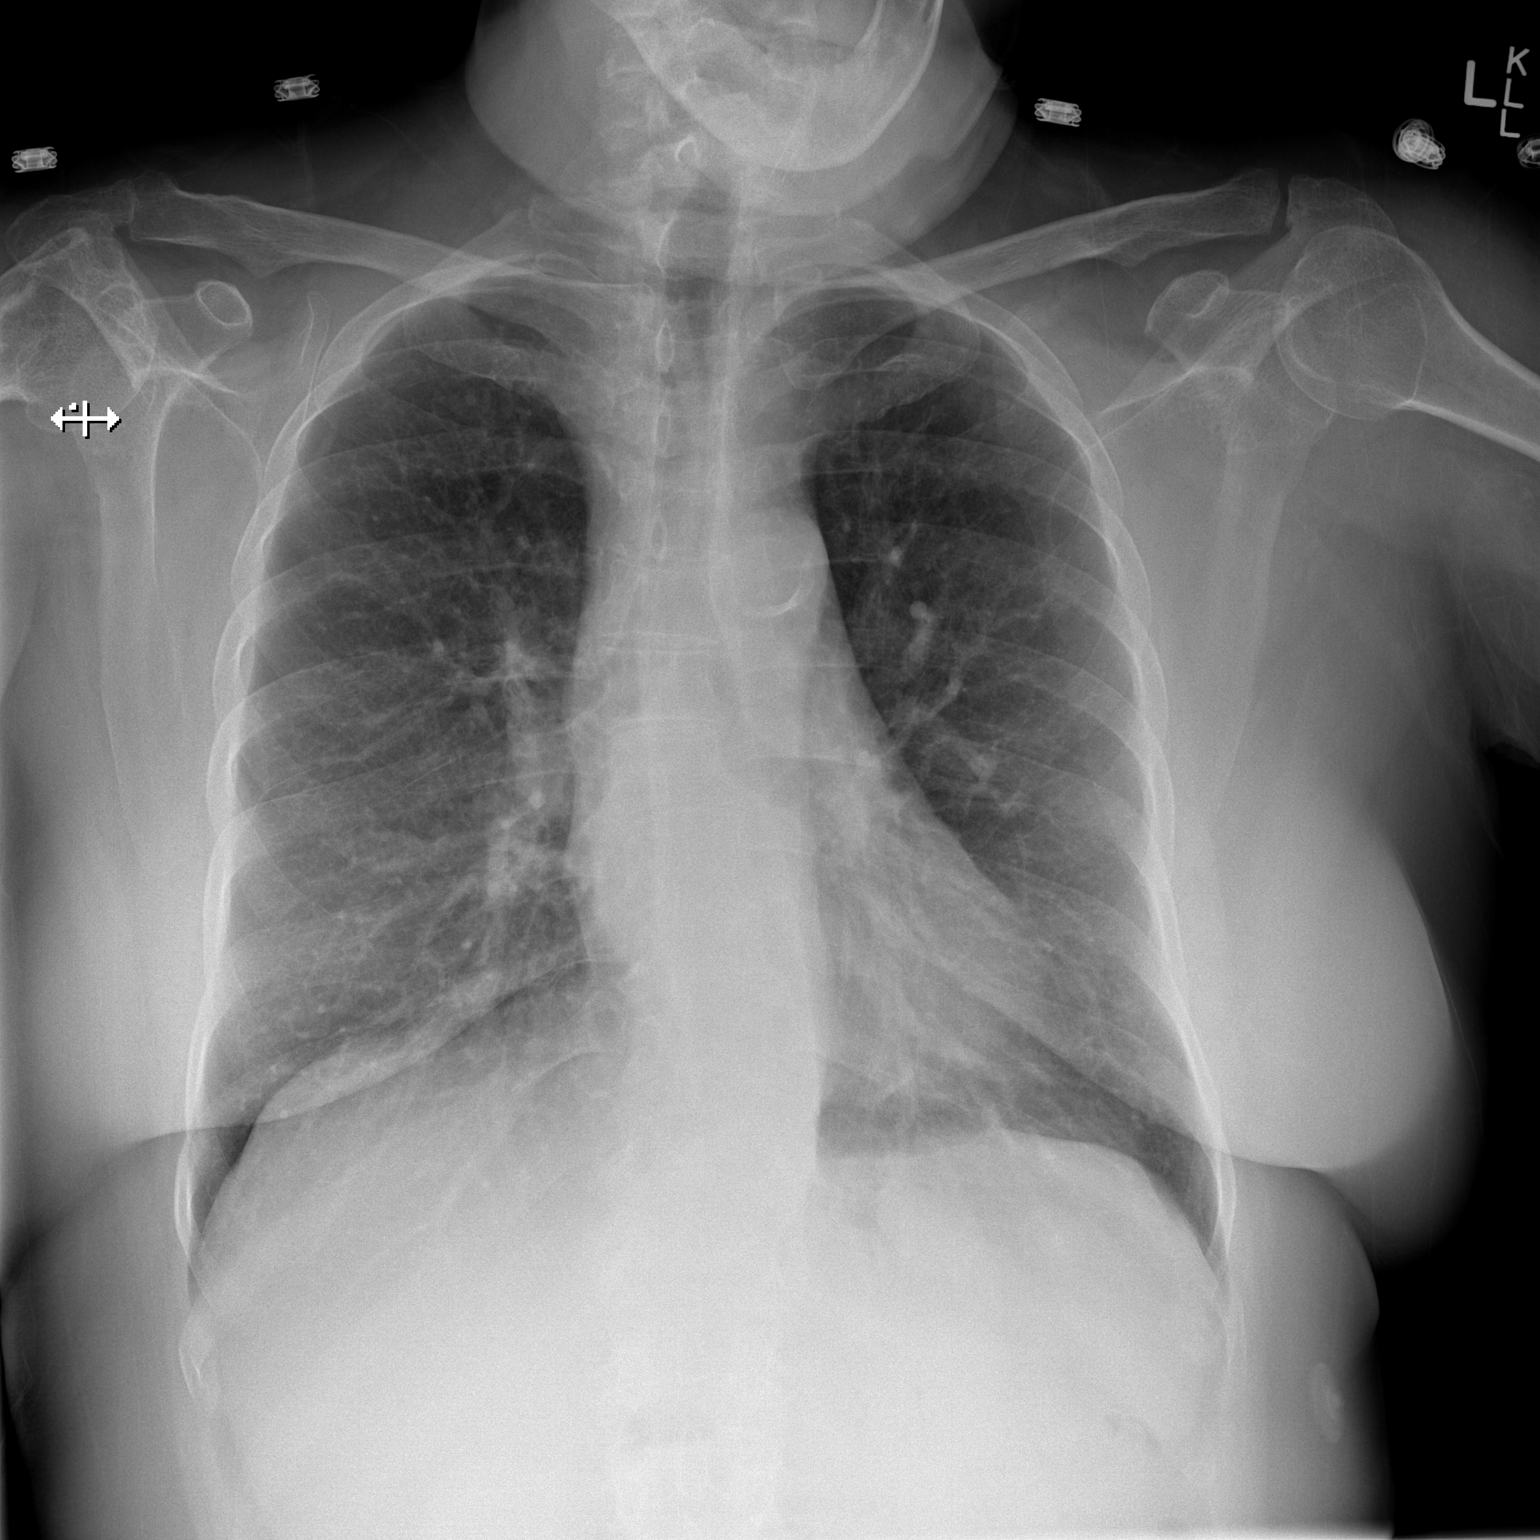

[w chest lat]
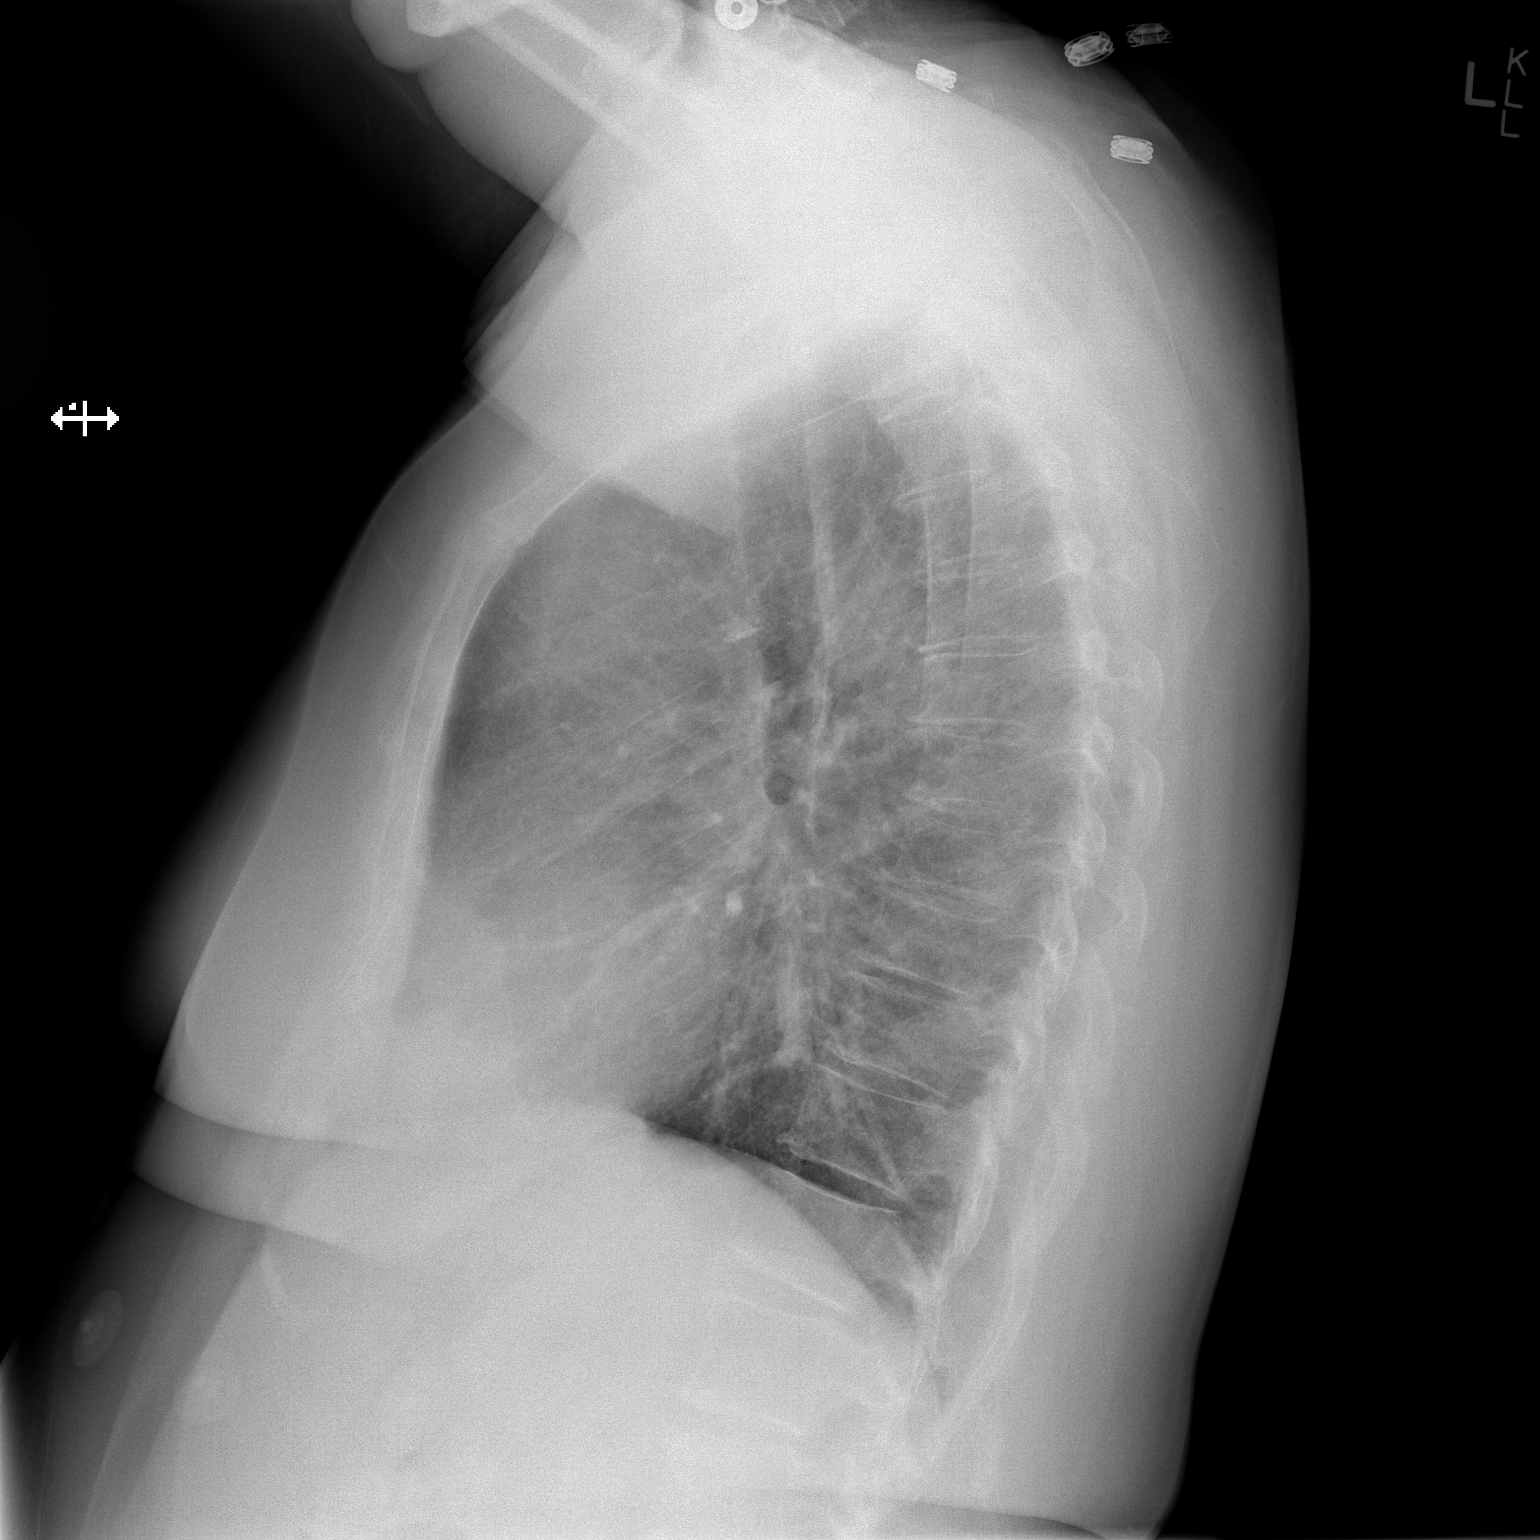

[2 of 2 positions shown; findings below may reference images not displayed]

FINDINGS: Heart and mediastinal contours are within normal limits. No focal
opacities or effusions. No acute bony abnormality. Aortic
atherosclerosis
IMPRESSION: No active cardiopulmonary disease.

## 2022-05-21 IMAGING — CT CT HEAD W/O CM
3 series · 15 of 47 positions shown, 18 images · non-contrast
Comparison: 04/13/2020

CLINICAL DATA: Head trauma, vomiting, migraine headache

EXAM:
CT HEAD WITHOUT CONTRAST
TECHNIQUE: Contiguous axial images were obtained from the base of the skull
through the vertex without intravenous contrast.

[Series 2: head wo · axial · 0.47mm/px · z∈[-147,-22]mm · 9 of 31 slices shown, 12 images]
[im 3/31  brain]
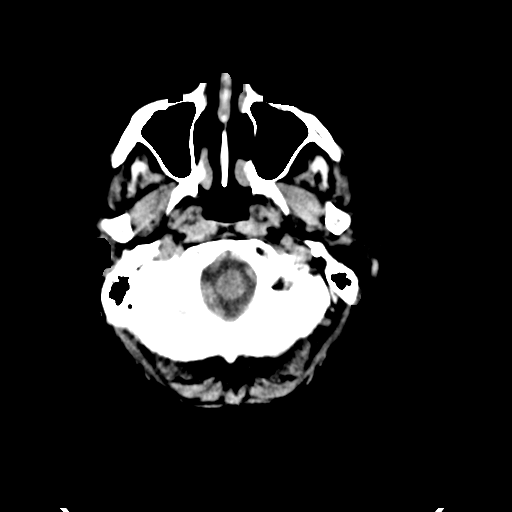
[im 3/31  bone]
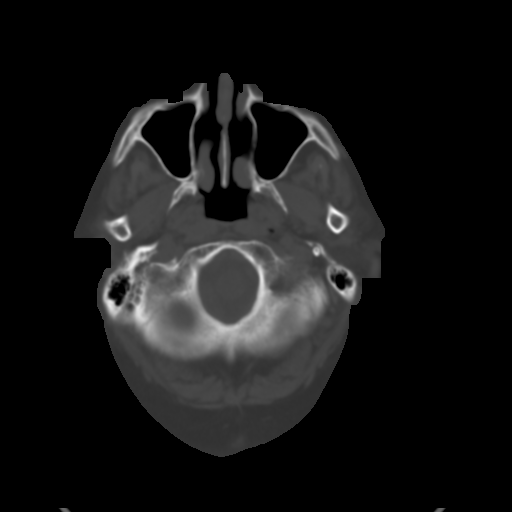
[im 6/31  brain]
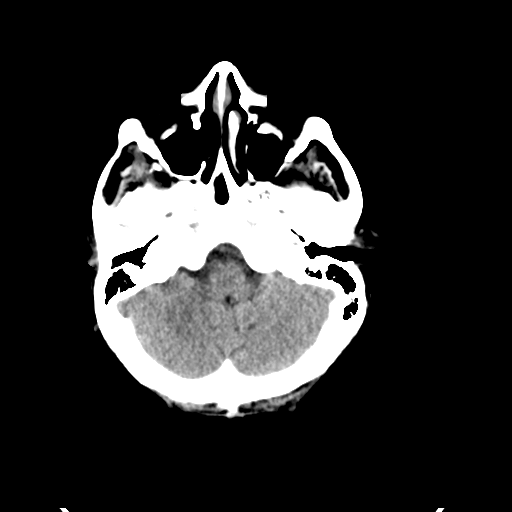
[im 9/31  brain]
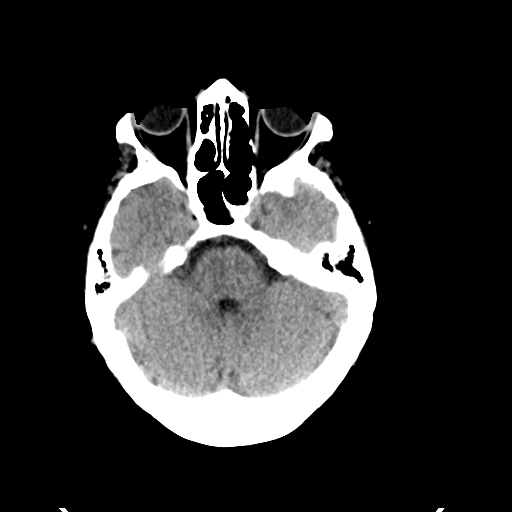
[im 12/31  brain]
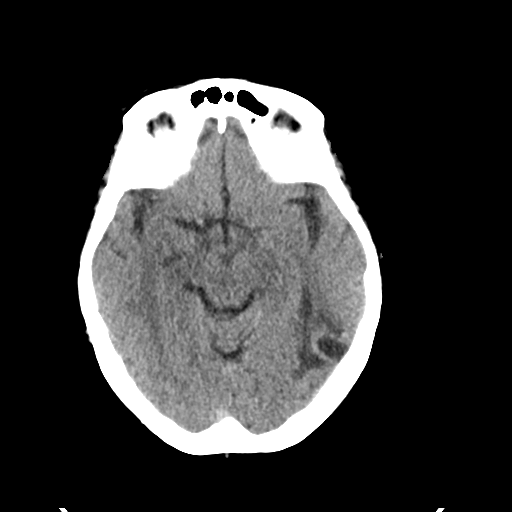
[im 16/31  brain]
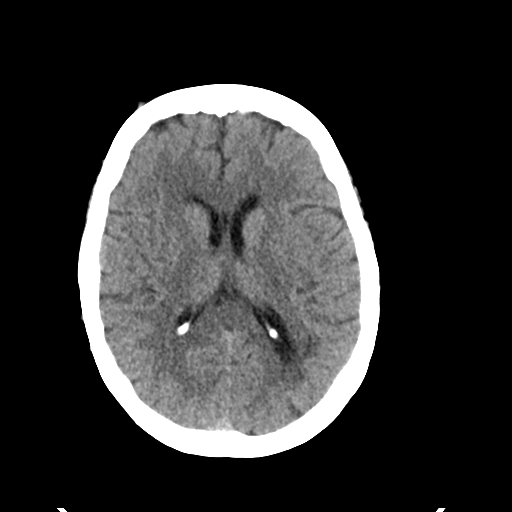
[im 16/31  bone]
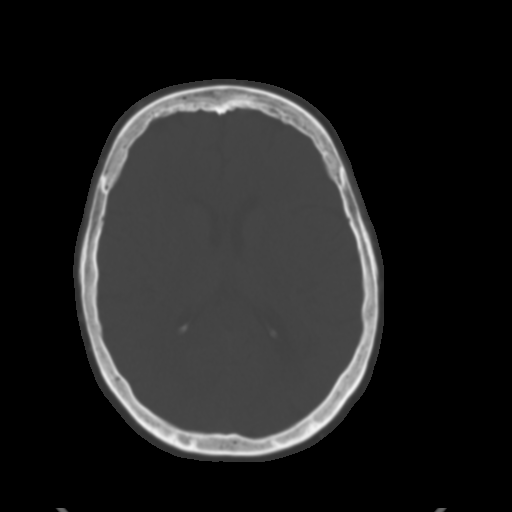
[im 19/31  brain]
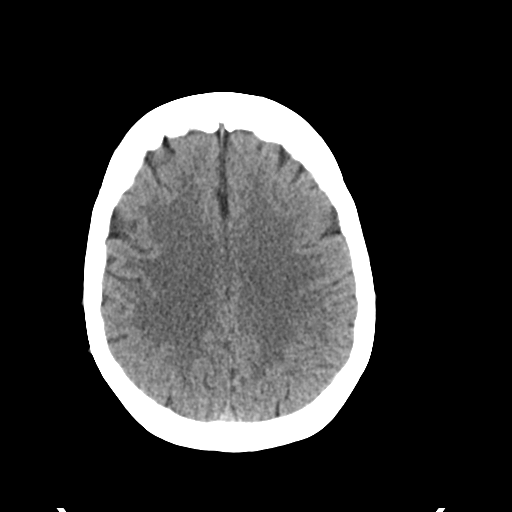
[im 22/31  brain]
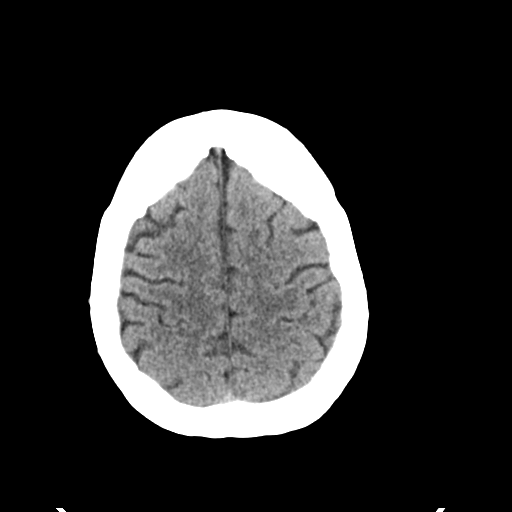
[im 25/31  brain]
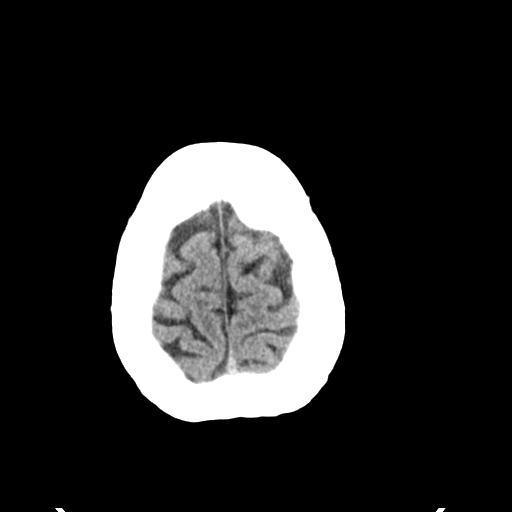
[im 28/31  brain]
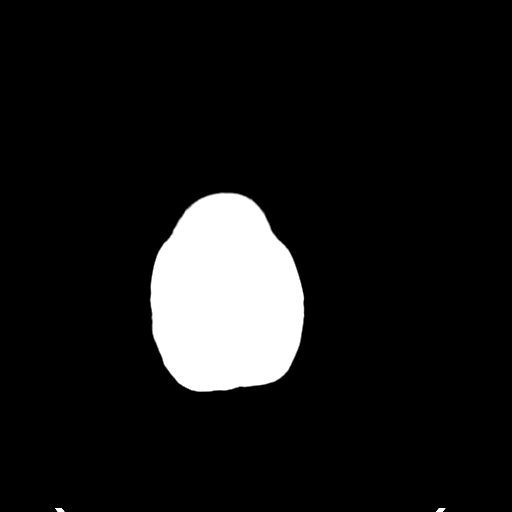
[im 28/31  bone]
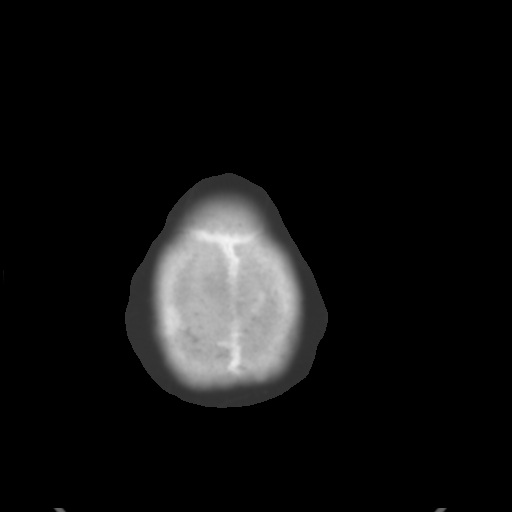

[Series 4: coronal soft tissue · coronal · 0.29mm/px · 3 of 64 slices shown]
[im 22/64  brain]
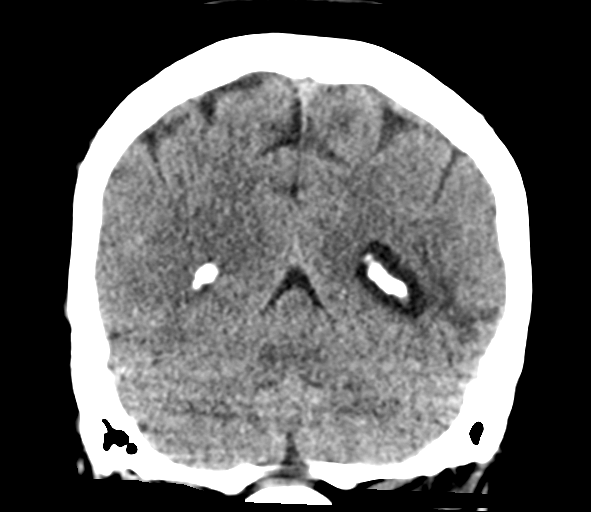
[im 29/64  brain]
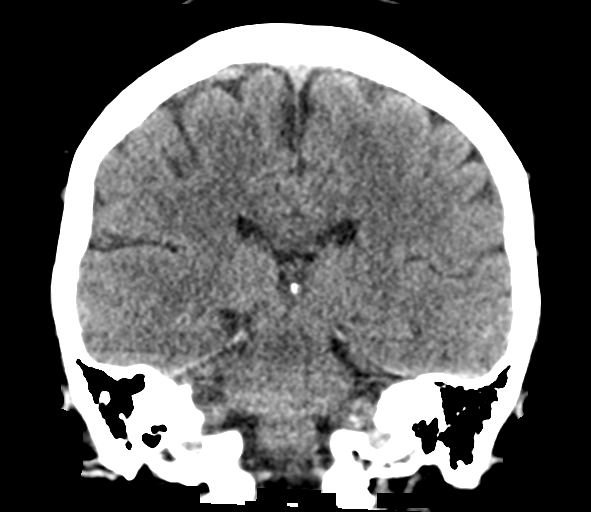
[im 36/64  brain]
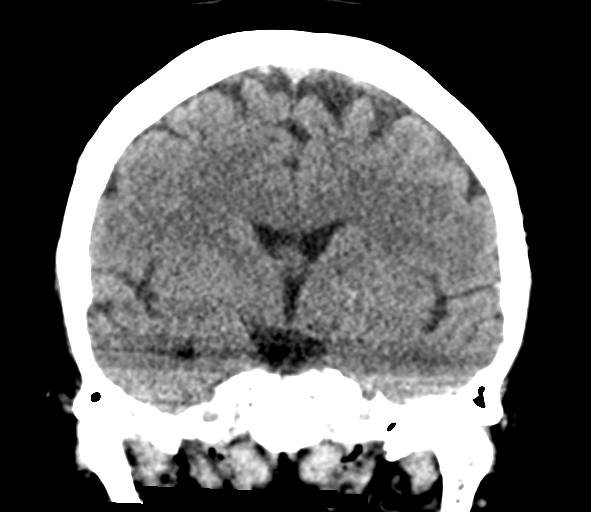

[Series 5: sagittal soft tissue · sagittal · 0.29mm/px · 3 of 58 slices shown]
[im 20/58  brain]
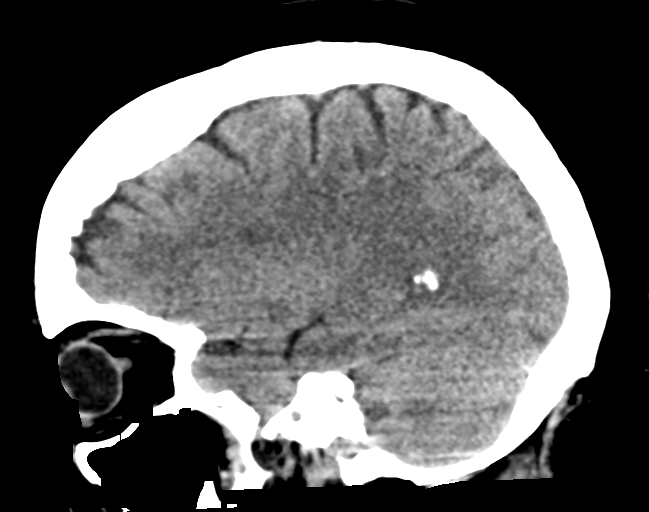
[im 29/58  brain]
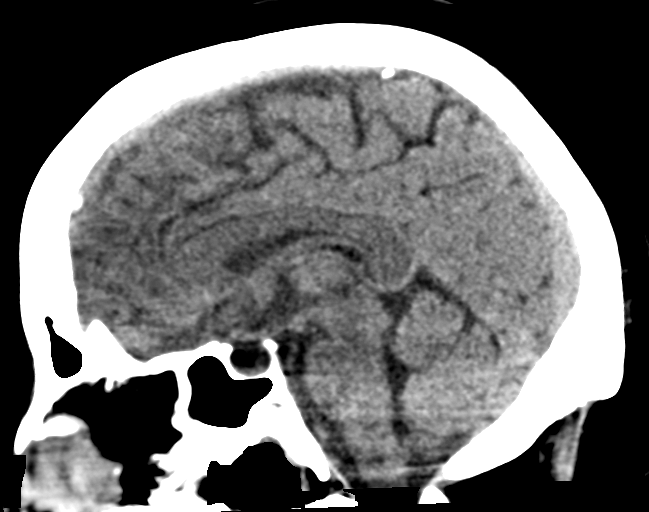
[im 39/58  brain]
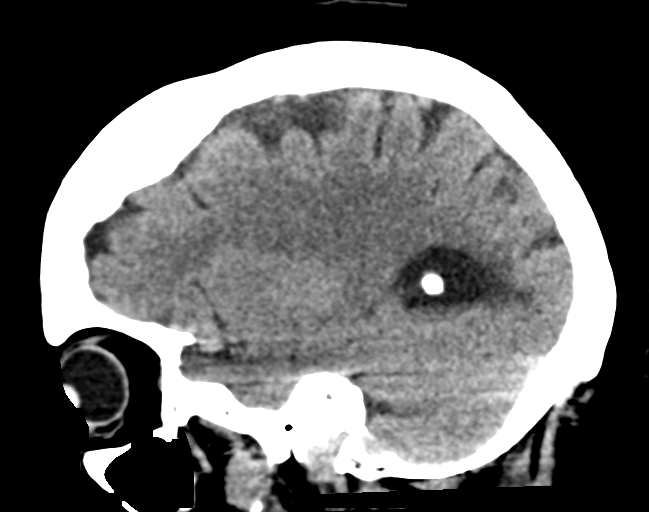

[15 of 47 positions shown; findings below may reference images not displayed]

FINDINGS: Brain: Encephalomalacia within the posterior left temporal lobe
likely related to remote cortical infarct is again noted and is
unchanged. Stable mild asymmetric ex vacuo dilation of the atrium of
the left lateral ventricle. No abnormal intra or extra-axial mass
lesion or fluid collection. No abnormal mass effect or midline
shift. No evidence of acute intracranial hemorrhage or infarct.
Ventricular size is normal. Cerebellum unremarkable.

Vascular: Unremarkable

Skull: Intact

Sinuses/Orbits: Paranasal sinuses are clear. Orbits are
unremarkable.

Other: Mastoid air cells and middle ear cavities are clear.
IMPRESSION: 1. No evidence of acute intracranial pathology.
2. Stable encephalomalacia within the posterior left temporal lobe
likely related to remote cortical infarct.

## 2022-05-29 ENCOUNTER — Encounter: Payer: Self-pay | Admitting: Internal Medicine

## 2022-05-29 ENCOUNTER — Ambulatory Visit: Payer: Self-pay | Admitting: *Deleted

## 2022-05-29 ENCOUNTER — Ambulatory Visit: Payer: Medicaid Other | Admitting: Internal Medicine

## 2022-05-29 VITALS — BP 126/78 | HR 95 | Temp 98.1°F | Resp 18 | Ht 62.0 in | Wt 194.9 lb

## 2022-05-29 DIAGNOSIS — Z23 Encounter for immunization: Secondary | ICD-10-CM | POA: Diagnosis not present

## 2022-05-29 DIAGNOSIS — W19XXXA Unspecified fall, initial encounter: Secondary | ICD-10-CM

## 2022-05-29 DIAGNOSIS — E119 Type 2 diabetes mellitus without complications: Secondary | ICD-10-CM

## 2022-05-29 DIAGNOSIS — F319 Bipolar disorder, unspecified: Secondary | ICD-10-CM | POA: Diagnosis not present

## 2022-05-29 DIAGNOSIS — R062 Wheezing: Secondary | ICD-10-CM

## 2022-05-29 DIAGNOSIS — F410 Panic disorder [episodic paroxysmal anxiety] without agoraphobia: Secondary | ICD-10-CM | POA: Diagnosis not present

## 2022-05-29 DIAGNOSIS — F4312 Post-traumatic stress disorder, chronic: Secondary | ICD-10-CM | POA: Diagnosis not present

## 2022-05-29 DIAGNOSIS — F411 Generalized anxiety disorder: Secondary | ICD-10-CM | POA: Diagnosis not present

## 2022-05-29 LAB — POCT GLYCOSYLATED HEMOGLOBIN (HGB A1C): Hemoglobin A1C: 7.8 % — AB (ref 4.0–5.6)

## 2022-05-29 MED ORDER — BREZTRI AEROSPHERE 160-9-4.8 MCG/ACT IN AERO: 2.0000 | INHALATION_SPRAY | Freq: Two times a day (BID) | RESPIRATORY_TRACT | 3 refills | Status: DC

## 2022-05-29 MED ORDER — RYBELSUS 3 MG PO TABS: 3.0000 mg | ORAL_TABLET | Freq: Every day | ORAL | 1 refills | Status: DC

## 2022-05-29 NOTE — Patient Instructions (Addendum)
It was great seeing you today!  Plan discussed at today's visit: -Inhaler refilled -New diabetes medicine sent to pharmacy -Work on decreasing sugar and carbs in the diet  -Bring all medications and medicine list to follow up   Follow up in: 2 weeks   Take care and let us know if you have any questions or concerns prior to your next visit.  Dr. Rosana Berger

## 2022-05-29 NOTE — Addendum Note (Signed)
Addended by: Docia Furl on: 05/29/2022 02:58 PM   Modules accepted: Orders

## 2022-05-29 NOTE — Telephone Encounter (Signed)
Reason for Disposition  [1] Caller has URGENT medicine question about med that PCP or specialist prescribed AND [2] triager unable to answer question  [1] MODERATE weakness (i.e., interferes with work, school, normal activities) AND [2] new-onset or worsening  Answer Assessment - Initial Assessment Questions 1. NAME of MEDICINE: "What medicine(s) are you calling about?"     Inhaler- not approved 2. QUESTION: "What is your question?" (e.g., double dose of medicine, side effect)     Patient states it has not been approved by insurance- patient has been waiting for 3 weeks- patient  requesting another sample. Patient states her grandson broke the top of her inhaler 3. PRESCRIBER: "Who prescribed the medicine?" Reason: if prescribed by specialist, call should be referred to that group.     PCP 4. SYMPTOMS: "Do you have any symptoms?" If Yes, ask: "What symptoms are you having?"  "How bad are the symptoms (e.g., mild, moderate, severe)     SOB- "little"- feels she needs inhaler 5. PREGNANCY:  "Is there any chance that you are pregnant?" "When was your last menstrual period?"  Answer Assessment - Initial Assessment Questions 1. MECHANISM: "How did the fall happen?"     Legs gave out- patient got up and fell 2. DOMESTIC VIOLENCE AND ELDER ABUSE SCREENING: "Did you fall because someone pushed you or tried to hurt you?" If Yes, ask: "Are you safe now?"     no 3. ONSET: "When did the fall happen?" (e.g., minutes, hours, or days ago)     Friday 4. LOCATION: "What part of the body hit the ground?" (e.g., back, buttocks, head, hips, knees, hands, head, stomach)     Knees and elbows 5. INJURY: "Did you hurt (injure) yourself when you fell?" If Yes, ask: "What did you injure? Tell me more about this?" (e.g., body area; type of injury; pain severity)"     Bruise on knee- R, elbows had scratches 6. PAIN: "Is there any pain?" If Yes, ask: "How bad is the pain?" (e.g., Scale 1-10; or mild,  moderate,  severe)   - NONE (0): No pain   - MILD (1-3): Doesn't interfere with normal activities    - MODERATE (4-7): Interferes with normal activities or awakens from sleep    - SEVERE (8-10): Excruciating pain, unable to do any normal activities      Elbow pain 7. SIZE: For cuts, bruises, or swelling, ask: "How large is it?" (e.g., inches or centimeters)      R elbow- not wanting to heal 8. PREGNANCY: "Is there any chance you are pregnant?" "When was your last menstrual period?"     *No Answer* 9. OTHER SYMPTOMS: "Do you have any other symptoms?" (e.g., dizziness, fever, weakness; new onset or worsening).      Weakness in legs, dizzy occasional  10. CAUSE: "What do you think caused the fall (or falling)?" (e.g., tripped, dizzy spell)       Leg weakness  Protocols used: Medication Question [place], Falls and Falling-A-AH

## 2022-05-29 NOTE — Progress Notes (Signed)
Established Patient Office Visit  Subjective   [name]: Stephanie [name], female    DOB: 1966/05/27  Age: 56 y.o. MRN: [id number]  Chief Complaint  Patient presents with   Follow-up    HPI Patient is here for follow up. Here with step-son.   Fall: [place] Seat-Fell about 5 days ago when she felt like both of her legs gave out -Denies LOC or hitting her head but has had issues with her vision going dark when she bends forward, feeling lightheaded -Is apparently taking 4 blood pressure medications but the only one on her list is Lasix   Diabetes, Type 2: -Last A1c 6/23 6.0% -Medications: Metformin 1000 mg BID (had been on Glipizide in the past) -Patient is compliant with the above medications and reports no side effects.  -Checking BG at home: 170 fasting; random 200-290 -Diet: eats sausage sandwich on wheat bread, no lunch  -Eye exam: UTD 8/23 -Foot exam: UTD, will do at follow up  -Microalbumin: UTD 6/23 -Statin: yes -PNA vaccine: UTD -Denies symptoms of hypoglycemia, polyuria, polydipsia, numbness extremities, foot ulcers/trauma.   HLD/Hx of CVA: -CVA August 2017, follows with Neurology, seeing them later this week  -Medications: Crestor 20 mg not on Plavix 75 mg but it is on her list  -Patient is compliant with above medications and reports no side effects.  -Last lipid panel: Lipid Panel     Component Value Date/Time   CHOL 123 02/13/2022 1211   TRIG 69 02/13/2022 1211   HDL 73 02/13/2022 1211   CHOLHDL 1.7 02/13/2022 1211   VLDL 18 04/22/2016 0357   LDLCALC 35 02/13/2022 1211   COPD: -COPD status: stable -Current medications: Breztri 160, Albuterol PRN -Satisfied with current treatment?: yes -Oxygen use: no -Dyspnea frequency: occasional  -Cough frequency: no -Rescue inhaler frequency:  3 times daily right now but she is out of her Breztri  -Limitation of activity: yes -Productive cough: no -Pneumovax: Not up to Date -Influenza: Not up to Date -Former smoker;  quit smoking 6 months ago   Bilateral Nephrolithiasis: -Chronic, following with Urology, seeing them later today  GERD: -Currently on Prilosec 40 mg daily   Depression: -Follows with Psychiatry at [facility]  -Abilify 15 mg, Valium, uncertain of other meds  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   Bipolar disorder (HCC) 05/09/2016   Hyperlipidemia, unspecified 05/09/2016   Irritable bowel syndrome 05/09/2016   Lumbago with sciatica 05/09/2016   Esophageal reflux 05/09/2016   Scoliosis 05/09/2016   Diverticulosis of colon 05/09/2016   DDD (degenerative disc disease), cervical 05/09/2016   ICH (intracerebral hemorrhage) (HCC)    Essential hypertension, malignant 04/19/2016   Cytotoxic brain edema (HCC) 04/19/2016   IVH (intraventricular hemorrhage) (HCC) 04/18/2016   Bilateral carpal tunnel syndrome 04/12/2016   Falls frequently 01/12/2016   Trigeminal neuralgia 06/08/2015   Sacroiliac dysfunction 03/04/2014   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 or radiculopathy 05/20/2013   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   Tobacco use disorder 09/11/2012   History of cervical cancer 07/30/2012  Hepatitis C    Past Medical History:  Diagnosis Date   Closed fracture of shaft of humerus 01/20/2017   Diabetes mellitus without complication (HCC)    Difficult intubation    Diverticulitis    Headache    Hepatitis C    treated and resolved   History of kidney stones    Hypertension     Hypotension 08/22/2018   IBS (irritable bowel syndrome)    Kidney stones    Sciatica    Sleep apnea    on cpap   Stroke (HCC) 2017   memory loss   Superficial venous thrombosis of arm, left 07/22/2016   Past Surgical History:  Procedure Laterality Date   ABDOMINAL HYSTERECTOMY  2007   carpel tunnel syndrome  2017   HARDWARE REMOVAL Right 12/22/2017   Procedure: HARDWARE REMOVAL-RIGHT ARM;  Surgeon: Lyndle Herrlich, MD;  Location: [facility];  Service: Orthopedics;  Laterality: Right;  Removal of implant, superficial right humerus   HUMERUS IM NAIL Right 03/11/2017   Procedure: INTRAMEDULLARY (IM) NAIL HUMERAL;  Surgeon: Lyndle Herrlich, MD;  Location: [facility];  Service: Orthopedics;  Laterality: Right;   KIDNEY STONE SURGERY Right 02/12/2012   KIDNEY STONE SURGERY Left 07/15/2012   LIGATION OF ARTERIOVENOUS  FISTULA Left 06/21/2016   Procedure: LIGATION OF ARTERIOVENOUS  FISTULA ( LIGATION BASILIC VEIN );  Surgeon: Renford Dills, MD;  Location: [facility];  Service: Vascular;  Laterality: Left;   LITHOTRIPSY     OOPHORECTOMY     SINUS EXPLORATION     TONSILLECTOMY     Social History   Tobacco Use   Smoking status: Every Day    Packs/day: 1.00    Years: 1.50    Total pack years: 1.50    Types: Cigarettes   Smokeless tobacco: Never  Vaping Use   Vaping Use: Never used  Substance Use Topics   Alcohol use: Not Currently    Comment: no alcohol since 2002   Drug use: No   Social History   Socioeconomic History   Marital status: Legally Separated    Spouse name: Not on file   Number of children: Not on file   Years of education: Not on file   Highest education level: Not on file  Occupational History   Not on file  Tobacco Use   Smoking status: Every Day    Packs/day: 1.00    Years: 1.50    Total pack years: 1.50    Types: Cigarettes   Smokeless tobacco: Never  Vaping Use   Vaping Use: Never used  Substance and Sexual Activity   Alcohol use: Not Currently     Comment: no alcohol since 2002   Drug use: No   Sexual activity: Not on file  Other Topics Concern   Not on file  Social History Narrative   Not on file   Social Determinants of Health   Financial Resource Strain: Not on file  Food Insecurity: No Food Insecurity (05/08/2022)   Hunger Vital Sign    Worried About Running Out of Food in the Last Year: Never true    Ran Out of Food in the Last Year: Never true  Transportation Needs: Unmet Transportation Needs (05/08/2022)   PRAPARE - Administrator, Civil Service (Medical): Yes    Lack of Transportation (Non-Medical): No  Physical Activity: Not on file  Stress: Not on file  Social Connections: Not on file  Intimate Partner Violence: Not on file  Family Status  Relation Name Status   Mother  Deceased   Father  Alive   Sister  Deceased   Family History  Problem Relation Age of Onset   Heart failure Mother    Diabetes Father    Cancer Sister    Allergies  Allergen Reactions   Gabapentin Itching   Ibuprofen Itching   Sulfa Antibiotics Hives   Tylenol [Acetaminophen] Itching   Aspirin Itching   Buprenorphine Hcl Itching   Carbamazepine Itching, Other (See Comments) and Rash    Makes her feel like something is crawling under her skin.   Codeine Itching and Other (See Comments)    Makes her feel like something is crawling under her skin. Can take, sometimes makes her itch   Compazine Itching and Other (See Comments)    "makes my skin crawl"   Elemental Sulfur Hives, Rash and Other (See Comments)    Skin Rashes, Hives   Ioxaglate Itching   Ivp Dye [Iodinated Contrast Media] Itching and Other (See Comments)    Makes her itch really bad. Had to get 2-3 shots of Benadryl when she was in the hospital.   Metrizamide Itching   Naproxen Itching   Norco [Hydrocodone-Acetaminophen] Itching   Other Itching and Other (See Comments)    Makes her itch really bad. Had to get 2-3 shots of Benadryl when she was in the  hospital.   Penicillin G Hives, Rash and Other (See Comments)    Has patient had a PCN reaction causing immediate rash, facial/tongue/throat swelling, SOB or lightheadedness with hypotension: Yes Has patient had a PCN reaction causing severe rash involving mucus membranes or skin necrosis: No Has patient had a PCN reaction that required hospitalization: No Has patient had a PCN reaction occurring within the last 10 years: No If all of the above answers are "NO", then may proceed with Cephalosporin use.    Prochlorperazine Maleate Other (See Comments)    Compazine makes her skin crawl - needs 2-3 shots of Benadryl to get relief.   Reglan [Metoclopramide] Itching and Other (See Comments)    Makes her skin crawl   Toradol [Ketorolac Tromethamine] Itching   Tramadol Itching   Zofran [Ondansetron Hcl] Itching    Review of Systems  Constitutional:  Negative for chills and fever.  Respiratory:  Negative for cough, shortness of breath and wheezing.   Cardiovascular:  Negative for chest pain.  Gastrointestinal:  Negative for abdominal pain, heartburn, nausea and vomiting.      Objective:     BP 126/78   Pulse 95   Temp 98.1 F (36.7 C)   Resp 18   Ht 5\' 2"  (1.575 m)   Wt 194 lb 14.4 oz (88.4 kg)   SpO2 97%   BMI 35.65 kg/m  BP Readings from Last 3 Encounters:  05/29/22 126/78  05/06/22 120/76  04/17/22 124/72   Wt Readings from Last 3 Encounters:  05/29/22 194 lb 14.4 oz (88.4 kg)  05/06/22 196 lb (88.9 kg)  04/16/22 214 lb 1.1 oz (97.1 kg)     Physical Exam Constitutional:      Appearance: Normal appearance.  HENT:     Head: Normocephalic and atraumatic.  Eyes:     Conjunctiva/sclera: Conjunctivae normal.  Cardiovascular:     Rate and Rhythm: Normal rate and regular rhythm.     Pulses:          Dorsalis pedis pulses are 2+ on the right side and 2+ on the left side.  Pulmonary:     Effort: Pulmonary effort is normal.     Breath sounds: Normal breath sounds.   Musculoskeletal:     Right foot: Normal range of motion. No deformity, bunion, Charcot foot, foot drop or prominent metatarsal heads.     Left foot: Normal range of motion. No deformity, bunion, Charcot foot, foot drop or prominent metatarsal heads.  Feet:     Right foot:     Protective Sensation: 4 sites tested.  6 sites sensed.     Skin integrity: Skin integrity normal.     Toenail Condition: Right toenails are normal.     Left foot:     Protective Sensation: 5 sites tested.  6 sites sensed.     Skin integrity: Skin integrity normal.     Toenail Condition: Left toenails are normal.  Skin:    General: Skin is warm and dry.     Findings: Laceration present.     Comments: One large scab on left elbow and moderate sized skin tear on right elbow without signs of infection.  Neurological:     Mental Status: She is alert. Mental status is at baseline.  Psychiatric:        Mood and Affect: Affect is flat.        Speech: Speech is delayed.        Behavior: Behavior normal.        Thought Content: Thought content normal.     Results for orders placed or performed in visit on 05/29/22  POCT HgB A1C  Result Value Ref Range   Hemoglobin A1C 7.8 (A) 4.0 - 5.6 %   HbA1c POC (<> result, manual entry)     HbA1c, POC (prediabetic range)     HbA1c, POC (controlled diabetic range)      Last CBC Lab Results  Component Value Date   WBC 6.2 04/17/2022   HGB 12.7 04/17/2022   HCT 39.0 04/17/2022   MCV 92.9 04/17/2022   MCH 30.2 04/17/2022   RDW 13.6 04/17/2022   PLT 154 04/17/2022   Last metabolic panel Lab Results  Component Value Date   GLUCOSE 148 (H) 04/17/2022   NA 143 04/17/2022   K 3.1 (L) 04/17/2022   CL 104 04/17/2022   CO2 29 04/17/2022   BUN 16 04/17/2022   CREATININE 0.65 04/17/2022   GFRNONAA >60 04/17/2022   CALCIUM 9.2 04/17/2022   PHOS 2.3 (L) 08/22/2018   PROT 7.4 04/16/2022   ALBUMIN 3.9 04/16/2022   BILITOT 0.5 04/16/2022   ALKPHOS 96 04/16/2022   AST 30  04/16/2022   ALT 34 04/16/2022   ANIONGAP 10 04/17/2022   Last lipids Lab Results  Component Value Date   CHOL 123 02/13/2022   HDL 73 02/13/2022   LDLCALC 35 02/13/2022   TRIG 69 02/13/2022   CHOLHDL 1.7 02/13/2022   Last hemoglobin A1c Lab Results  Component Value Date   HGBA1C 7.8 (A) 05/29/2022   Last thyroid functions Lab Results  Component Value Date   TSH 1.218 04/16/2022   Last vitamin D No results found for: "25OHVITD2", "25OHVITD3", "VD25OH" Last vitamin B12 and Folate Lab Results  Component Value Date   VITAMINB12 380 02/20/2022      The ASCVD Risk score (Arnett DK, et al., 2019) failed to calculate for the following reasons:   The valid total cholesterol range is 130 to 320 mg/dL    Assessment & Plan:   1. Type 2 diabetes mellitus without complication, without long-term current  use of insulin (HCC): A1c today elevated at 7.8%. Patient states diet has not changed. Continue Metformin 1000 mg BID, add Rybelsus 3 mg daily. Follow up in 2 weeks. Diabetic foot exam today.   - POCT HgB A1C - [facility] Diabetes Foot Exam - Semaglutide (RYBELSUS) 3 MG TABS; Take 3 mg by mouth daily.  Dispense: 30 tablet; Refill: 1  2. Fall, initial encounter: Patient feeling light headed occasionally at home, leading to falls. States she is on 4-5 blood pressure medications but they are not on the list here and she does not know what they are. We discussed patient needs to bring her medications with her at her follow up in 2 weeks. I suspect polypharmacy - patient is on Valium twice daily with slow reactions. May require referral to pharmacy for medication review. Blood pressure stable here but could be having orthostatic changes, recommend monitoring blood pressure at home. DME referral placed at [facility] for cane to help reduce fall risk.   3. Wheezing: Refilled Breztri inhaler today.   - BREZTRI AEROSPHERE 160-9-4.8 MCG/ACT AERO; Inhale 2 puffs into the lungs 2 (two) times daily.  Dispense:  5.9 g; Refill: 3  4. Need for influenza vaccination: Flu vaccine administered today.   - Flu Vaccine QUAD 6+ mos PF IM (Fluarix Quad PF)  Return in about 1 week (around 06/05/2022).    Margarita Mail, DO

## 2022-05-29 NOTE — Telephone Encounter (Signed)
  Chief Complaint: weakness in legs Symptoms: recent fall- weakness in legs, scape on elbow not healing, some dizziness at times Frequency: fell Friday Pertinent Negatives: Patient denies   Disposition: [] ED /[] Urgent Care (no appt availability in office) / [x] Appointment(In office/virtual)/ []  [place] [facility]/ [] Home Care/ [] Refused Recommended Disposition /[] [facility]/ []  Follow-up with PCP Additional Notes: Call to office- patient actually has appointment today- patient notified and will come to that appointment

## 2022-05-31 ENCOUNTER — Telehealth: Payer: Self-pay

## 2022-05-31 ENCOUNTER — Emergency Department (HOSPITAL_COMMUNITY): Payer: Medicaid Other

## 2022-05-31 ENCOUNTER — Encounter (HOSPITAL_COMMUNITY): Payer: Self-pay

## 2022-05-31 ENCOUNTER — Emergency Department (HOSPITAL_COMMUNITY)
Admission: EM | Admit: 2022-05-31 | Discharge: 2022-05-31 | Disposition: A | Discharge status: Home / Self Care | Payer: Medicaid Other | Attending: Emergency Medicine | Admitting: Emergency Medicine

## 2022-05-31 ENCOUNTER — Other Ambulatory Visit: Payer: Self-pay

## 2022-05-31 DIAGNOSIS — S0993XA Unspecified injury of face, initial encounter: Secondary | ICD-10-CM | POA: Diagnosis not present

## 2022-05-31 DIAGNOSIS — Y92009 Unspecified place in unspecified non-institutional (private) residence as the place of occurrence of the external cause: Secondary | ICD-10-CM | POA: Insufficient documentation

## 2022-05-31 DIAGNOSIS — E876 Hypokalemia: Secondary | ICD-10-CM | POA: Insufficient documentation

## 2022-05-31 DIAGNOSIS — M25561 Pain in right knee: Secondary | ICD-10-CM | POA: Diagnosis not present

## 2022-05-31 DIAGNOSIS — R531 Weakness: Secondary | ICD-10-CM | POA: Insufficient documentation

## 2022-05-31 DIAGNOSIS — W19XXXA Unspecified fall, initial encounter: Secondary | ICD-10-CM | POA: Insufficient documentation

## 2022-05-31 DIAGNOSIS — H532 Diplopia: Secondary | ICD-10-CM | POA: Diagnosis not present

## 2022-05-31 DIAGNOSIS — I10 Essential (primary) hypertension: Secondary | ICD-10-CM | POA: Diagnosis not present

## 2022-05-31 DIAGNOSIS — R519 Headache, unspecified: Secondary | ICD-10-CM | POA: Diagnosis not present

## 2022-05-31 DIAGNOSIS — S0990XA Unspecified injury of head, initial encounter: Secondary | ICD-10-CM | POA: Diagnosis not present

## 2022-05-31 DIAGNOSIS — Z7902 Long term (current) use of antithrombotics/antiplatelets: Secondary | ICD-10-CM | POA: Diagnosis not present

## 2022-05-31 LAB — URINALYSIS, ROUTINE W REFLEX MICROSCOPIC
Bacteria, UA: NONE SEEN
Bilirubin Urine: NEGATIVE
Glucose, UA: NEGATIVE mg/dL
Hgb urine dipstick: NEGATIVE
Ketones, ur: NEGATIVE mg/dL
Nitrite: NEGATIVE
Protein, ur: NEGATIVE mg/dL
Specific Gravity, Urine: 1.004 — ABNORMAL LOW (ref 1.005–1.030)
pH: 8 (ref 5.0–8.0)

## 2022-05-31 LAB — CBC WITH DIFFERENTIAL/PLATELET
Abs Immature Granulocytes: 0.01 10*3/uL (ref 0.00–0.07)
Basophils Absolute: 0 10*3/uL (ref 0.0–0.1)
Basophils Relative: 1 %
Eosinophils Absolute: 0 10*3/uL (ref 0.0–0.5)
Eosinophils Relative: 0 %
HCT: 41.7 % (ref 36.0–46.0)
Hemoglobin: 13.9 g/dL (ref 12.0–15.0)
Immature Granulocytes: 0 %
Lymphocytes Relative: 25 %
Lymphs Abs: 1.5 10*3/uL (ref 0.7–4.0)
MCH: 31.2 pg (ref 26.0–34.0)
MCHC: 33.3 g/dL (ref 30.0–36.0)
MCV: 93.5 fL (ref 80.0–100.0)
Monocytes Absolute: 0.5 10*3/uL (ref 0.1–1.0)
Monocytes Relative: 9 %
Neutro Abs: 4 10*3/uL (ref 1.7–7.7)
Neutrophils Relative %: 65 %
Platelets: 203 10*3/uL (ref 150–400)
RBC: 4.46 MIL/uL (ref 3.87–5.11)
RDW: 14.1 % (ref 11.5–15.5)
WBC: 6.1 10*3/uL (ref 4.0–10.5)
nRBC: 0 % (ref 0.0–0.2)

## 2022-05-31 LAB — BASIC METABOLIC PANEL
Anion gap: 8 (ref 5–15)
BUN: 12 mg/dL (ref 6–20)
CO2: 31 mmol/L (ref 22–32)
Calcium: 10.3 mg/dL (ref 8.9–10.3)
Chloride: 102 mmol/L (ref 98–111)
Creatinine, Ser: 0.75 mg/dL (ref 0.44–1.00)
GFR, Estimated: >60 mL/min (ref >60)
Glucose, Bld: 153 mg/dL — ABNORMAL HIGH (ref 70–99)
Potassium: 3 mmol/L — ABNORMAL LOW (ref 3.5–5.1)
Sodium: 141 mmol/L (ref 135–145)

## 2022-05-31 LAB — PROTIME-INR
INR: 1 (ref 0.8–1.2)
Prothrombin Time: 13.5 seconds (ref 11.4–15.2)

## 2022-05-31 MED ORDER — POTASSIUM CHLORIDE CRYS ER 20 MEQ PO TBCR
40.0000 meq | EXTENDED_RELEASE_TABLET | Freq: Once | ORAL | Status: AC
  Administered 2022-05-31: 40 meq via ORAL
  Filled 2022-05-31: qty 2

## 2022-05-31 MED ORDER — ACETAMINOPHEN 500 MG PO TABS
1000.0000 mg | ORAL_TABLET | Freq: Once | ORAL | Status: AC
  Administered 2022-05-31: 1000 mg via ORAL
  Filled 2022-05-31: qty 2

## 2022-05-31 NOTE — ED Notes (Signed)
Pt ambulatory to bathroom w/o assist. Steady gait noted

## 2022-05-31 NOTE — Telephone Encounter (Signed)
Copied from [place] [id number]. Topic: General - Other >> May 31, 2022 11:55 AM Leitha Schuller wrote: Pt making provider aware she felled and "busted her face open" and think she has broken her nose  Pt states she is currently an [facility] ED

## 2022-05-31 NOTE — ED Triage Notes (Signed)
Pt states she fell and tripped on some water on the floor at home today. Pt has some pain to her nose, she fell on her face. Pt has open wound to nose, bleeding controlled at this time. Pt hx hemorrhagic stroke. Pt takes blood thinners, Plavix. Pt denies LOC.

## 2022-05-31 NOTE — ED Notes (Signed)
Attempted to obtain urine sample. Pt ambulatory to bathroom. Pt "missed the cup" and toilet, and urinated on the floor. Will re-attempt later

## 2022-05-31 NOTE — ED Provider Notes (Signed)
[facility] DEPT Provider Note   CSN: [id number] Arrival date & time: 05/31/22  1127     History  Chief Complaint  Patient presents with   Fall   Facial Injury    Stephanie [name] is a 56 y.o. female.  The history is provided by the patient and medical records. No language interpreter was used.  Fall  Facial Injury    Patient is a 56 year old female who was brought to the ED by her husband after she fell this morning. Patient states she slipped on some coffee she spilt in her kitchen. She states her nose hit the table leg of her dining room table. She did not lose consciousness. She states her head, nose, and knee are what is hurting her right now. Patient also had a fall last week where she states she fell on her right knee and her elbows and she did not seek medical treatment for that fall. Patient is taking blood thinners. She denies n/v, dyspnea, chest pain, dizziness, numbness or tingling. She does report weakness, headache, and diplopia.  Home Medications Prior to Admission medications   Medication Sig Start Date End Date Taking? Authorizing Provider  albuterol (VENTOLIN HFA) 108 (90 Base) MCG/ACT inhaler Inhale 2 puffs into the lungs every 6 (six) hours as needed for wheezing or shortness of breath. 02/13/22   Myles Gip, DO  ARIPiprazole (ABILIFY) 15 MG tablet Take 15 mg by mouth every morning. 06/10/21   [provider]  BREZTRI AEROSPHERE 160-9-4.8 MCG/ACT AERO Inhale 2 puffs into the lungs 2 (two) times daily. 05/29/22   Teodora Medici, DO  CAPLYTA 42 MG capsule Take 42 mg by mouth every evening. 06/08/21   [provider]  clopidogrel (PLAVIX) 75 MG tablet Take 75 mg by mouth daily. 02/25/22   [provider]  Continuous Blood Gluc Sensor (FREESTYLE LIBRE 2 SENSOR) MISC USE WITH READER TO CHECK SUGARS DAILY 02/01/22   [provider]  diazepam (VALIUM) 10 MG tablet Take 10 mg by mouth 3 (three) times  daily. 03/27/22   [provider]  furosemide (LASIX) 40 MG tablet Take 1 tablet (40 mg total) by mouth 2 (two) times daily as needed for edema (take 40 mg in the morning, can take additional 40 mg in afternoon if needed for edema). 04/30/22   Bo Merino, FNP  levETIRAcetam (KEPPRA) 250 MG tablet Take 250 mg by mouth 2 (two) times daily. 01/15/22   [provider]  metFORMIN (GLUCOPHAGE) 500 MG tablet Take 2 tablets (1,000 mg total) by mouth 2 (two) times daily with a meal. 04/09/22   Delsa Grana, PA-C  omeprazole (PRILOSEC) 40 MG capsule Take 40 mg by mouth daily. 08/13/19   [provider]  prazosin (MINIPRESS) 2 MG capsule Take 4 mg by mouth every evening. 02/15/21   [provider]      Allergies    Gabapentin, Ibuprofen, Sulfa antibiotics, Tylenol [acetaminophen], Aspirin, Buprenorphine hcl, Carbamazepine, Codeine, Compazine, Elemental sulfur, Ioxaglate, Ivp dye [iodinated contrast media], Metrizamide, Naproxen, Norco [hydrocodone-acetaminophen], Other, Penicillin g, Prochlorperazine maleate, Reglan [metoclopramide], Toradol [ketorolac tromethamine], Tramadol, and Zofran [ondansetron hcl]    Review of Systems   Review of Systems  All other systems reviewed and are negative.   Physical Exam Updated Vital Signs BP 124/86   Pulse 88   Temp (!) 97.3 F (36.3 C) (Oral)   Resp 12   SpO2 91%  Physical Exam Vitals and nursing note reviewed.  Constitutional:  General: She is not in acute distress.    Appearance: She is well-developed.  HENT:     Head:     Comments: No raccoon's eyes, or battle sign, no malocclusion    Nose:     Comments: Dry blood noted to bridge of nose without active bleeding or deep laceration.  No blood in nares, no septal hematoma.  Eyes:     Extraocular Movements: Extraocular movements intact.     Conjunctiva/sclera: Conjunctivae normal.     Pupils: Pupils are equal, round, and reactive to light.  Cardiovascular:     Rate  and Rhythm: Normal rate and regular rhythm.     Pulses: Normal pulses.     Heart sounds: Normal heart sounds.  Pulmonary:     Effort: Pulmonary effort is normal.  Abdominal:     Palpations: Abdomen is soft.     Tenderness: There is no abdominal tenderness.  Musculoskeletal:        General: Tenderness (R knee: anterior knee ttp without bruising or deformity.  normal flexion/extension.) present.     Cervical back: Neck supple.  Skin:    Findings: No rash.  Neurological:     Mental Status: She is alert.  Psychiatric:        Mood and Affect: Mood normal.     ED Results / Procedures / Treatments   Labs (all labs ordered are listed, but only abnormal results are displayed) Labs Reviewed  BASIC METABOLIC PANEL - Abnormal; Notable for the following components:      Result Value   Potassium 3.0 (*)    Glucose, Bld 153 (*)    All other components within normal limits  URINALYSIS, ROUTINE W REFLEX MICROSCOPIC - Abnormal; Notable for the following components:   Color, Urine STRAW (*)    Specific Gravity, Urine 1.004 (*)    Leukocytes,Ua LARGE (*)    All other components within normal limits  CBC WITH DIFFERENTIAL/PLATELET  PROTIME-INR    EKG None ED ECG REPORT   Date: 05/31/2022  Rate: 85  Rhythm: normal sinus rhythm  QRS Axis: left  Intervals: PR prolonged  ST/T Wave abnormalities: nonspecific T wave changes  Conduction Disutrbances:none  Narrative Interpretation:   Old EKG Reviewed: unchanged  I have personally reviewed the EKG tracing and agree with the computerized printout as noted.   Radiology DG Knee Complete 4 Views Right  Result Date: 05/31/2022 CLINICAL DATA:  Right knee pain after fall today. EXAM: RIGHT KNEE - COMPLETE 4+ VIEW COMPARISON:  None Available. FINDINGS: No evidence of fracture, dislocation, or joint effusion. No evidence of arthropathy or other focal bone abnormality. Soft tissues are unremarkable. IMPRESSION: Negative. Electronically Signed   By:  Marijo Conception M.D.   On: 05/31/2022 16:06   CT Head Wo Contrast  Result Date: 05/31/2022 CLINICAL DATA:  Trauma EXAM: CT HEAD WITHOUT CONTRAST TECHNIQUE: Contiguous axial images were obtained from the base of the skull through the vertex without intravenous contrast. RADIATION DOSE REDUCTION: This exam was performed according to the departmental dose-optimization program which includes automated exposure control, adjustment of the mA and/or kV according to patient size and/or use of iterative reconstruction technique. COMPARISON:  MRI brain April 16, 2022 FINDINGS: Brain: Redemonstrated chronic left temporal lobe infarct. No evidence of acute infarct, hemorrhage, extra-axial fluid collection, hydrocephalus. Incidentally noted is a partially empty sella Vascular: No hyperdense vessel or unexpected calcification. Skull: Normal. Negative for fracture or focal lesion. Sinuses/Orbits: No acute finding. Other: None. IMPRESSION: No CT evidence  of intracranial injury. Electronically Signed   By: Marin Roberts M.D.   On: 05/31/2022 15:09   CT Maxillofacial Wo Contrast  Result Date: 05/31/2022 CLINICAL DATA:  Trauma EXAM: CT MAXILLOFACIAL WITHOUT CONTRAST TECHNIQUE: Multidetector CT imaging of the maxillofacial structures was performed. Multiplanar CT image reconstructions were also generated. RADIATION DOSE REDUCTION: This exam was performed according to the departmental dose-optimization program which includes automated exposure control, adjustment of the mA and/or kV according to patient size and/or use of iterative reconstruction technique. COMPARISON:  None Available. FINDINGS: Osseous: No fracture or mandibular dislocation. No destructive process. Orbits: Negative. No traumatic or inflammatory finding. Sinuses: Mild mucosal thickening right sphenoid sinus. Soft tissues: Negative. Limited intracranial: No significant or unexpected finding. IMPRESSION: No evidence of facial bone fracture. Electronically Signed    By: Marin Roberts M.D.   On: 05/31/2022 15:06   CT Cervical Spine Wo Contrast  Result Date: 05/31/2022 CLINICAL DATA:  Trauma EXAM: CT CERVICAL SPINE WITHOUT CONTRAST TECHNIQUE: Multidetector CT imaging of the cervical spine was performed without intravenous contrast. Multiplanar CT image reconstructions were also generated. RADIATION DOSE REDUCTION: This exam was performed according to the departmental dose-optimization program which includes automated exposure control, adjustment of the mA and/or kV according to patient size and/or use of iterative reconstruction technique. COMPARISON:  None Available. FINDINGS: Alignment: Straightening of cervical lordosis. Skull base and vertebrae: No acute fracture. No primary bone lesion or focal pathologic process. Degenerative changes are noted at the superior and inferior endplates of C6 and C7 vertebral bodies. Soft tissues and spinal canal: No prevertebral fluid or swelling. No visible canal hematoma. Disc levels: No evidence of high-grade spinal canal or neural foraminal stenosis. Upper chest: Negative. Other: None. IMPRESSION: No evidence of cervical spine fracture. Electronically Signed   By: Marin Roberts M.D.   On: 05/31/2022 15:01    Procedures Procedures    Medications Ordered in ED Medications  acetaminophen (TYLENOL) tablet 1,000 mg (1,000 mg Oral Given 05/31/22 1658)  potassium chloride SA (KLOR-CON M) CR tablet 40 mEq (40 mEq Oral Given 05/31/22 1658)    ED Course/ Medical Decision Making/ A&P                           Medical Decision Making Amount and/or Complexity of Data Reviewed Labs: ordered. Radiology: ordered. ECG/medicine tests: ordered.  Risk OTC drugs. Prescription drug management.   BP 124/86   Pulse 88   Temp (!) 97.3 F (36.3 C) (Oral)   Resp 12   SpO2 91%   91:102 PM 57 year old female significant history of prior stroke currently on Plavix, frequent falls, presenting to the ED for evaluation of a fall.  Patient  states she slipped on her spilled coffee earlier this morning and fell striking her face against a corner table.  She denies any loss of consciousness.  She is complaining of pain to her face and her right knee from the fall.  She was able to ambulate afterward.  She denies any focal numbness or focal weakness.  She is up-to-date with tetanus.  Pain is moderate in severity.  She denies any specific treatment tried.  She denies any precipitating symptoms prior to the fall.  On exam patient has dried blood noted to the bridge of her nose and blood pressures.  She also has tenderness about right knee.  Posterior scalp nontender.  No other injury noted.  No focal neurodeficit.  Labs and imaging obtained independently viewed interpreted  by me and agree with radiology interpretation.  Labs remarkable for potassium of 3.0, will give supplementation.  Normal WBC, normal H&H, electrolytes are otherwise reassuring.  EKG without concerning arrhythmia or ischemic changes.  CT scan of the maxillofacial, head, and cervical spine did not show any acute fracture or dislocation and no internal bleeding.  Patient does mention that patient is on multiple blood pressure medication and sometimes it affects her blood pressure.  I suspect that the potentially cause her fall and her recurrent falls.  Her doctor is currently working on trying to mediate her medication.  I do recommend close follow-up with.  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  She will need to have her potassium level rechecked.  At this time patient is mentating appropriately, able to ambulate, husband at bedside and she is stable to be discharged home.  Return precaution given.  This patient presents to the ED for concern of fall, this involves an extensive number of treatment options, and is a complaint that carries with it a high risk of complications and morbidity.  The differential diagnosis includes mechanical fall,  stroke, acs, uti, electrolytes imbalance, metabolic derangement, cardiac arrhythmia  Co morbidities that complicate the patient evaluation DM  Stroke  Frequent falls Additional history obtained:  Additional history obtained from husband External records from outside source obtained and reviewed including EMR including prior labs and imaging  Lab Tests:  I Ordered, and personally interpreted labs.  The pertinent results include:  as above  Imaging Studies ordered:  I ordered imaging studies including head/maxillofacial/cspine I independently visualized and interpreted imaging which showed no acute finding I agree with the radiologist interpretation  Cardiac Monitoring:  The patient was maintained on a cardiac monitor.  I personally viewed and interpreted the cardiac monitored which showed an underlying rhythm of: NSR  Medicines ordered and prescription drug management:  I ordered medication including tylenol  for head pain Reevaluation of the patient after these medicines showed that the patient improved I have reviewed the patients home medicines and have made adjustments as needed  Test Considered: as above  Critical Interventions: potassium supplementation  Non opiate pain medication3  Consultations Obtained:  I requested consultation with the attending Dr. Mayra Neer,  and discussed lab and imaging findings as well as pertinent plan - they recommend: outpt f/u  Problem List / ED Course: mechanical fall  Facial injury  Reevaluation:  After the interventions noted above, I reevaluated the patient and found that they have :improved  Social Determinants of Health: tobacco use  Dispostion:  After consideration of the diagnostic results and the patients response to treatment, I feel that the patent would benefit from outpt f/u.         Final Clinical Impression(s) / ED Diagnoses Final diagnoses:  Facial injury, initial encounter  Fall in home, initial  encounter  Hypokalemia    Rx / DC Orders ED Discharge Orders     None         Domenic Moras, PA-C 05/31/22 1800    Audley Hose, MD 05/31/22 2302

## 2022-05-31 NOTE — Discharge Instructions (Addendum)
You have been evaluated for your symptoms.  You do have a cut to your nose.  Please apply Neosporin or bacitracin over the wound to decrease risk of infection.  You may take over-the-counter Tylenol as needed for pain.  Follow-up closely with your doctor for further managements of your condition.  You may benefit from reassessment of your medication to make sure that you are taking the appropriate medication to decrease risk of falling.  Return if you have any concern.  Your potassium level is low, please have it rechecked again.

## 2022-05-31 NOTE — ED Notes (Signed)
Noted pt ambulatory to bathroom in waiting room

## 2022-05-31 NOTE — ED Provider Triage Note (Signed)
Emergency Medicine Provider Triage Evaluation Note  Stephanie [name] , a 56 y.o. female  was evaluated in triage.  Pt complains of fall. Slipped on wet ground (coffee spills) and fell forward striking face against corner table. No LOC.  On plavix due to hx of stroke  Review of Systems  Positive: As above Negative: As above  Physical Exam  BP (!) 145/94 (BP Location: Left Arm)   Pulse (!) 101   Temp (!) 97.3 F (36.3 C) (Oral)   Resp 18   SpO2 95%  Gen:   Awake, no distress   Resp:  Normal effort  MSK:   Moves extremities without difficulty  Other:    Medical Decision Making  Medically screening exam initiated at 12:51 PM.  Appropriate orders placed.  STARASIA SINKO was informed that the remainder of the evaluation will be completed by another provider, this initial triage assessment does not replace that evaluation, and the importance of remaining in the ED until their evaluation is complete.     Domenic Moras, PA-C 05/31/22 1257

## 2022-05-31 NOTE — ED Notes (Signed)
Domenic Moras, PA bedside.

## 2022-05-31 NOTE — Telephone Encounter (Signed)
FYI

## 2022-06-04 ENCOUNTER — Ambulatory Visit: Payer: Self-pay

## 2022-06-04 NOTE — Telephone Encounter (Signed)
Reason for Disposition  [1] Caller requesting a prescription renewal (no refills left), no triage required, AND [2] triager able to renew prescription per department policy  Answer Assessment - Initial Assessment Questions 1. NAME of MEDICINE: "What medicine(s) are you calling about?"     I need a refill on my omeprazole.   I called my pharmacy and they said to call my PCP. 2. QUESTION: "What is your question?" (e.g., double dose of medicine, side effect)     I need a refill 3. PRESCRIBER: "Who prescribed the medicine?" Reason: if prescribed by specialist, call should be referred to that group.     Dr. Rosana Berger 4. SYMPTOMS: "Do you have any symptoms?" If Yes, ask: "What symptoms are you having?"  "How bad are the symptoms (e.g., mild, moderate, severe)     N/A 5. PREGNANCY:  "Is there any chance that you are pregnant?" "When was your last menstrual period?"     N/A  Answer Assessment - Initial Assessment Questions 1. DRUG NAME: "What medicine do you need to have refilled?"     Omeprazole 2. REFILLS REMAINING: "How many refills are remaining?" (Note: The label on the medicine or pill bottle will show how many refills are remaining. If there are no refills remaining, then a renewal may be needed.)     Pharmacy said to call PCP 3. EXPIRATION DATE: "What is the expiration date?" (Note: The label states when the prescription will expire, and thus can no longer be refilled.)     Not asked 4. PRESCRIBING HCP: "Who prescribed it?" Reason: If prescribed by specialist, call should be referred to that group.     Dr. Rosana Berger 5. SYMPTOMS: "Do you have any symptoms?"     N/A 6. PREGNANCY: "Is there any chance that you are pregnant?" "When was your last menstrual period?"     N/A  Protocols used: Medication Question Call-A-AH, Medication Refill and Renewal Call-A-AH  Chief Complaint: Needs refill of Omeprazole   Pharmacy said to call PCP when she called them. Symptoms:  Frequency:  Pertinent  Negatives: Patient denies  Disposition: [] ED /[] Urgent Care (no appt availability in office) / [] Appointment(In office/virtual)/ []  [facility]/ [x] Home Care/ [] Refused Recommended Disposition /[] [facility]/ []  Follow-up with PCP Additional Notes: Needs new rx.   Request sent to Dr. Teodora Medici

## 2022-06-04 NOTE — Progress Notes (Unsigned)
06/05/2022 4:53 PM   Stephanie [name] 01/15/1966 [id number]  Referring provider: Margarita Mail, DO [address] [place],  [place] [address]  Urological history: 1.  Nephrolithiasis -Stone composition 60% calcium oxalate and 40% calcium phosphate  -right URS 03/2019 @ [facility] -CT renal stone study (04/2022) - Several bilateral nonobstructing renal stones unchanged -KUB (05/2022) - ***  2. Renal cysts -CT renal stone study (04/2022) - stable small right renal cysts  3. Right renal atrophy -CT renal stone study (04/2022) - stable mild right renal atrophy   No chief complaint on file.   HPI: Stephanie [name] is a 56 y.o. female who presents today for kidney stones.   UA ***  KUB ***   PMH: Past Medical History:  Diagnosis Date   Closed fracture of shaft of humerus 01/20/2017   Diabetes mellitus without complication (HCC)    Difficult intubation    Diverticulitis    Headache    Hepatitis C    treated and resolved   History of kidney stones    Hypertension    Hypotension 08/22/2018   IBS (irritable bowel syndrome)    Kidney stones    Sciatica    Sleep apnea    on cpap   Stroke (HCC) 2017   memory loss   Superficial venous thrombosis of arm, left 07/22/2016    Surgical History: Past Surgical History:  Procedure Laterality Date   ABDOMINAL HYSTERECTOMY  2007   carpel tunnel syndrome  2017   HARDWARE REMOVAL Right 12/22/2017   Procedure: HARDWARE REMOVAL-RIGHT ARM;  Surgeon: Lyndle Herrlich, MD;  Location: [facility] OR[facility];  Service: Orthopedics;  Laterality: Right;  Removal of implant, superficial right humerus   HUMERUS IM NAIL Right 03/11/2017   Procedure: INTRAMEDULLARY (IM) NAIL HUMERAL;  Surgeon: Lyndle Herrlich, MD;  Location: [facility];  Service: Orthopedics;  Laterality: Right;   KIDNEY STONE SURGERY Right 02/12/2012   KIDNEY STONE SURGERY Left 07/15/2012   LIGATION OF ARTERIOVENOUS  FISTULA Left 06/21/2016   Procedure: LIGATION OF  ARTERIOVENOUS  FISTULA ( LIGATION BASILIC VEIN );  Surgeon: Renford Dills, MD;  Location: [facility] OR[facility];  Service: Vascular;  Laterality: Left;   LITHOTRIPSY     OOPHORECTOMY     SINUS EXPLORATION     TONSILLECTOMY      Home Medications:  Allergies as of 06/05/2022       Reactions   Gabapentin Itching   Ibuprofen Itching   Sulfa Antibiotics Hives   Tylenol [acetaminophen] Itching   Aspirin Itching   Buprenorphine Hcl Itching   Carbamazepine Itching, Other (See Comments), Rash   Makes her feel like something is crawling under her skin.   Codeine Itching, Other (See Comments)   Makes her feel like something is crawling under her skin. Can take, sometimes makes her itch   Compazine Itching, Other (See Comments)   "makes my skin crawl"   Elemental Sulfur Hives, Rash, Other (See Comments)   Skin Rashes, Hives   Ioxaglate Itching   Ivp Dye [iodinated Contrast Media] Itching, Other (See Comments)   Makes her itch really bad. Had to get 2-3 shots of Benadryl when she was in the hospital.   Metrizamide Itching   Naproxen Itching   Norco [hydrocodone-acetaminophen] Itching   Other Itching, Other (See Comments)   Makes her itch really bad. Had to get 2-3 shots of Benadryl when she was in the hospital.   Penicillin G Hives, Rash, Other (See Comments)   Has  patient had a PCN reaction causing immediate rash, facial/tongue/throat swelling, SOB or lightheadedness with hypotension: Yes Has patient had a PCN reaction causing severe rash involving mucus membranes or skin necrosis: No Has patient had a PCN reaction that required hospitalization: No Has patient had a PCN reaction occurring within the last 10 years: No If all of the above answers are "NO", then may proceed with Cephalosporin use.   Prochlorperazine Maleate Other (See Comments)   Compazine makes her skin crawl - needs 2-3 shots of Benadryl to get relief.   Reglan [metoclopramide] Itching, Other (See Comments)   Makes her skin crawl    Toradol [ketorolac Tromethamine] Itching   Tramadol Itching   Zofran [ondansetron Hcl] Itching        Medication List        Accurate as of June 04, 2022  4:53 PM. If you have any questions, ask your nurse or doctor.          albuterol 108 (90 Base) MCG/ACT inhaler Commonly known as: VENTOLIN HFA Inhale 2 puffs into the lungs every 6 (six) hours as needed for wheezing or shortness of breath.   ARIPiprazole 15 MG tablet Commonly known as: ABILIFY Take 15 mg by mouth every morning.   Breztri Aerosphere 160-9-4.8 MCG/ACT Aero Generic drug: Budeson-Glycopyrrol-Formoterol Inhale 2 puffs into the lungs 2 (two) times daily.   Caplyta 42 MG capsule Generic drug: lumateperone tosylate Take 42 mg by mouth every evening.   clopidogrel 75 MG tablet Commonly known as: PLAVIX Take 75 mg by mouth daily.   diazepam 10 MG tablet Commonly known as: VALIUM Take 10 mg by mouth 3 (three) times daily.   FreeStyle Libre 2 Sensor Misc USE WITH READER TO CHECK SUGARS DAILY   furosemide 40 MG tablet Commonly known as: LASIX Take 1 tablet (40 mg total) by mouth 2 (two) times daily as needed for edema (take 40 mg in the morning, can take additional 40 mg in afternoon if needed for edema).   levETIRAcetam 250 MG tablet Commonly known as: KEPPRA Take 250 mg by mouth 2 (two) times daily.   metFORMIN 500 MG tablet Commonly known as: GLUCOPHAGE Take 2 tablets (1,000 mg total) by mouth 2 (two) times daily with a meal.   omeprazole 40 MG capsule Commonly known as: PRILOSEC Take 40 mg by mouth daily.   prazosin 2 MG capsule Commonly known as: MINIPRESS Take 4 mg by mouth every evening.        Allergies:  Allergies  Allergen Reactions   Gabapentin Itching   Ibuprofen Itching   Sulfa Antibiotics Hives   Tylenol [Acetaminophen] Itching   Aspirin Itching   Buprenorphine Hcl Itching   Carbamazepine Itching, Other (See Comments) and Rash    Makes her feel like something is  crawling under her skin.   Codeine Itching and Other (See Comments)    Makes her feel like something is crawling under her skin. Can take, sometimes makes her itch   Compazine Itching and Other (See Comments)    "makes my skin crawl"   Elemental Sulfur Hives, Rash and Other (See Comments)    Skin Rashes, Hives   Ioxaglate Itching   Ivp Dye [Iodinated Contrast Media] Itching and Other (See Comments)    Makes her itch really bad. Had to get 2-3 shots of Benadryl when she was in the hospital.   Metrizamide Itching   Naproxen Itching   Norco [Hydrocodone-Acetaminophen] Itching   Other Itching and Other (See Comments)  Makes her itch really bad. Had to get 2-3 shots of Benadryl when she was in the hospital.   Penicillin G Hives, Rash and Other (See Comments)    Has patient had a PCN reaction causing immediate rash, facial/tongue/throat swelling, SOB or lightheadedness with hypotension: Yes Has patient had a PCN reaction causing severe rash involving mucus membranes or skin necrosis: No Has patient had a PCN reaction that required hospitalization: No Has patient had a PCN reaction occurring within the last 10 years: No If all of the above answers are "NO", then may proceed with Cephalosporin use.    Prochlorperazine Maleate Other (See Comments)    Compazine makes her skin crawl - needs 2-3 shots of Benadryl to get relief.   Reglan [Metoclopramide] Itching and Other (See Comments)    Makes her skin crawl   Toradol [Ketorolac Tromethamine] Itching   Tramadol Itching   Zofran [Ondansetron Hcl] Itching    Family History: Family History  Problem Relation Age of Onset   Heart failure Mother    Diabetes Father    Cancer Sister     Social History:  reports that she has been smoking cigarettes. She has a 1.50 pack-year smoking history. She has never used smokeless tobacco. She reports that she does not currently use alcohol. She reports that she does not use drugs.  ROS: Pertinent ROS  in HPI  Physical Exam: There were no vitals taken for this visit.  Constitutional:  Well nourished. Alert and oriented, No acute distress. HEENT: [place] AT, moist mucus membranes.  Trachea midline, no masses. Cardiovascular: No clubbing, cyanosis, or edema. Respiratory: Normal respiratory effort, no increased work of breathing. GI: Abdomen is soft, non tender, non distended, no abdominal masses. Liver and spleen not palpable.  No hernias appreciated.  Stool sample for occult testing is not indicated.   GU: No CVA tenderness.  No bladder fullness or masses.  *** external genitalia, *** pubic hair distribution, no lesions.  Normal urethral meatus, no lesions, no prolapse, no discharge.   No urethral masses, tenderness and/or tenderness. No bladder fullness, tenderness or masses. *** vagina mucosa, *** estrogen effect, no discharge, no lesions, *** pelvic support, *** cystocele and *** rectocele noted.  No cervical motion tenderness.  Uterus is freely mobile and non-fixed.  No adnexal/parametria masses or tenderness noted.  Anus and perineum are without rashes or lesions.   ***  Skin: No rashes, bruises or suspicious lesions. Lymph: No cervical or inguinal adenopathy. Neurologic: Grossly intact, no focal deficits, moving all 4 extremities. Psychiatric: Normal mood and affect.    Laboratory Data: Lab Results  Component Value Date   WBC 6.1 05/31/2022   HGB 13.9 05/31/2022   HCT 41.7 05/31/2022   MCV 93.5 05/31/2022   PLT 203 05/31/2022    Lab Results  Component Value Date   CREATININE 0.75 05/31/2022    Lab Results  Component Value Date   HGBA1C 7.8 (A) 05/29/2022    Lab Results  Component Value Date   TSH 1.218 04/16/2022       Component Value Date/Time   CHOL 123 02/13/2022 1211   HDL 73 02/13/2022 1211   CHOLHDL 1.7 02/13/2022 1211   VLDL 18 04/22/2016 0357   LDLCALC 35 02/13/2022 1211    Lab Results  Component Value Date   AST 30 04/16/2022   Lab Results  Component  Value Date   ALT 34 04/16/2022    Urinalysis    Component Value Date/Time   COLORURINE STRAW (A)  05/31/2022 1700   APPEARANCEUR CLEAR 05/31/2022 1700   APPEARANCEUR Hazy (A) 12/15/2020 1555   LABSPEC 1.004 (L) 05/31/2022 1700   LABSPEC 1.015 03/30/2014 1354   PHURINE 8.0 05/31/2022 1700   GLUCOSEU NEGATIVE 05/31/2022 1700   GLUCOSEU Negative 03/30/2014 1354   HGBUR NEGATIVE 05/31/2022 1700   BILIRUBINUR NEGATIVE 05/31/2022 1700   BILIRUBINUR Negative 05/06/2022 1325   BILIRUBINUR Negative 12/15/2020 1555   BILIRUBINUR Negative 03/30/2014 1354   KETONESUR NEGATIVE 05/31/2022 1700   PROTEINUR NEGATIVE 05/31/2022 1700   UROBILINOGEN 0.2 05/06/2022 1325   UROBILINOGEN 0.2 02/25/2015 0049   NITRITE NEGATIVE 05/31/2022 1700   LEUKOCYTESUR LARGE (A) 05/31/2022 1700   LEUKOCYTESUR Trace 03/30/2014 1354  I have reviewed the labs.   Pertinent Imaging: *** I have independently reviewed the films.    Assessment & Plan:  ***  1. Nephrolithiasis -UA *** -KUB *** No follow-ups on file.  These notes generated with voice recognition software. I apologize for typographical errors.  [place], [place] [address]  [place], [place] [address] [phone]

## 2022-06-04 NOTE — Telephone Encounter (Signed)
Pt has medication questions, does not know the name of the medication.  Best contact: [phone]             Left message to call back. Please advise

## 2022-06-05 ENCOUNTER — Ambulatory Visit (INDEPENDENT_AMBULATORY_CARE_PROVIDER_SITE_OTHER): Payer: Medicaid Other | Admitting: Urology

## 2022-06-05 ENCOUNTER — Ambulatory Visit
Admission: RE | Admit: 2022-06-05 | Discharge: 2022-06-05 | Disposition: A | Discharge status: Home / Self Care | Payer: Medicaid Other | Attending: Urology | Admitting: Urology

## 2022-06-05 ENCOUNTER — Other Ambulatory Visit: Payer: Self-pay

## 2022-06-05 ENCOUNTER — Encounter: Payer: Self-pay | Admitting: Urology

## 2022-06-05 ENCOUNTER — Ambulatory Visit
Admission: RE | Admit: 2022-06-05 | Discharge: 2022-06-05 | Disposition: A | Discharge status: Home / Self Care | Payer: Medicaid Other | Source: Ambulatory Visit | Attending: Urology | Admitting: Urology

## 2022-06-05 VITALS — BP 133/85 | HR 91 | Ht 62.0 in | Wt 197.0 lb

## 2022-06-05 DIAGNOSIS — R8271 Bacteriuria: Secondary | ICD-10-CM | POA: Diagnosis not present

## 2022-06-05 DIAGNOSIS — R82998 Other abnormal findings in urine: Secondary | ICD-10-CM

## 2022-06-05 DIAGNOSIS — N200 Calculus of kidney: Secondary | ICD-10-CM | POA: Insufficient documentation

## 2022-06-05 DIAGNOSIS — Z87442 Personal history of urinary calculi: Secondary | ICD-10-CM | POA: Diagnosis not present

## 2022-06-05 DIAGNOSIS — R8281 Pyuria: Secondary | ICD-10-CM | POA: Diagnosis not present

## 2022-06-05 DIAGNOSIS — R109 Unspecified abdominal pain: Secondary | ICD-10-CM | POA: Diagnosis not present

## 2022-06-05 DIAGNOSIS — R3989 Other symptoms and signs involving the genitourinary system: Secondary | ICD-10-CM | POA: Diagnosis not present

## 2022-06-05 LAB — URINALYSIS, COMPLETE
Bilirubin, UA: NEGATIVE
Glucose, UA: NEGATIVE
Ketones, UA: NEGATIVE
Nitrite, UA: NEGATIVE
Protein,UA: NEGATIVE
Specific Gravity, UA: 1.01 (ref 1.005–1.030)
Urobilinogen, Ur: 0.2 mg/dL (ref 0.2–1.0)
pH, UA: 7 (ref 5.0–7.5)

## 2022-06-05 LAB — MICROSCOPIC EXAMINATION: WBC, UA: >30 /hpf — AB (ref 0–5)

## 2022-06-05 MED ORDER — NITROFURANTOIN MONOHYD MACRO 100 MG PO CAPS: 100.0000 mg | ORAL_CAPSULE | Freq: Two times a day (BID) | ORAL | 0 refills | Status: DC

## 2022-06-06 DIAGNOSIS — M479 Spondylosis, unspecified: Secondary | ICD-10-CM | POA: Diagnosis not present

## 2022-06-06 MED ORDER — OMEPRAZOLE 40 MG PO CPDR: 40.0000 mg | DELAYED_RELEASE_CAPSULE | Freq: Every day | ORAL | 0 refills | Status: DC

## 2022-06-07 ENCOUNTER — Other Ambulatory Visit: Payer: Self-pay | Admitting: *Deleted

## 2022-06-07 NOTE — Patient Outreach (Signed)
  Medicaid Managed Care   Unsuccessful Attempt Note   06/07/2022 Name: Stephanie [name] MRN: [id number] DOB: 1966/06/07  Referred by: Teodora Medici, DO Reason for referral : High Risk Managed Medicaid (Unsuccessful RNCM follow up telephone outreach)   An unsuccessful telephone outreach was attempted today. The patient was referred to the case management team for assistance with care management and care coordination.    Follow Up Plan: The Managed Medicaid care management team will reach out to the patient again over the next 14 days.    Lurena Joiner RN, BSN [place]  [employer] manager

## 2022-06-07 NOTE — Patient Instructions (Signed)
Visit Information  Ms. Ritaj B Loken  - as a part of your Medicaid benefit, you are eligible for care management and care coordination services at no cost or copay. I was unable to reach you by phone today but would be happy to help you with your health related needs. Please feel free to call me @ [phone].   A member of the Managed Medicaid care management team will reach out to you again over the next 14 days.   Shirlie Enck RN, BSN [place]  [facility] RN Care Coordinator   

## 2022-06-08 LAB — CULTURE, URINE COMPREHENSIVE

## 2022-06-09 DIAGNOSIS — Z419 Encounter for procedure for purposes other than remedying health state, unspecified: Secondary | ICD-10-CM | POA: Diagnosis not present

## 2022-06-10 ENCOUNTER — Telehealth: Payer: Self-pay

## 2022-06-10 ENCOUNTER — Ambulatory Visit: Payer: Medicaid Other | Admitting: Internal Medicine

## 2022-06-10 DIAGNOSIS — Z7689 Persons encountering health services in other specified circumstances: Secondary | ICD-10-CM | POA: Diagnosis not present

## 2022-06-10 DIAGNOSIS — Z79899 Other long term (current) drug therapy: Secondary | ICD-10-CM | POA: Diagnosis not present

## 2022-06-10 DIAGNOSIS — E119 Type 2 diabetes mellitus without complications: Secondary | ICD-10-CM | POA: Diagnosis not present

## 2022-06-10 DIAGNOSIS — G4733 Obstructive sleep apnea (adult) (pediatric): Secondary | ICD-10-CM | POA: Diagnosis not present

## 2022-06-10 DIAGNOSIS — I69398 Other sequelae of cerebral infarction: Secondary | ICD-10-CM | POA: Diagnosis not present

## 2022-06-10 DIAGNOSIS — G43719 Chronic migraine without aura, intractable, without status migrainosus: Secondary | ICD-10-CM | POA: Diagnosis not present

## 2022-06-10 DIAGNOSIS — R2689 Other abnormalities of gait and mobility: Secondary | ICD-10-CM | POA: Diagnosis not present

## 2022-06-10 NOTE — Telephone Encounter (Signed)
-----   Message from Nori Riis, PA-C sent at 06/08/2022 11:08 AM EDT ----- Please let Stephanie [name] know that her urine culture was negative for infectious bacteria.  There is no need for an antibiotic at this time.

## 2022-06-10 NOTE — Telephone Encounter (Signed)
Ok per DPR, LMOM notifying pt. 

## 2022-06-12 ENCOUNTER — Ambulatory Visit: Payer: Medicaid Other | Admitting: Internal Medicine

## 2022-06-12 ENCOUNTER — Encounter: Payer: Self-pay | Admitting: Internal Medicine

## 2022-06-12 VITALS — BP 102/78 | HR 112 | Temp 97.7°F | Resp 18 | Ht 62.0 in | Wt 192.7 lb

## 2022-06-12 DIAGNOSIS — E119 Type 2 diabetes mellitus without complications: Secondary | ICD-10-CM

## 2022-06-12 DIAGNOSIS — R262 Difficulty in walking, not elsewhere classified: Secondary | ICD-10-CM | POA: Diagnosis not present

## 2022-06-12 DIAGNOSIS — R296 Repeated falls: Secondary | ICD-10-CM

## 2022-06-12 DIAGNOSIS — Z79899 Other long term (current) drug therapy: Secondary | ICD-10-CM | POA: Diagnosis not present

## 2022-06-12 DIAGNOSIS — Z8679 Personal history of other diseases of the circulatory system: Secondary | ICD-10-CM

## 2022-06-12 MED ORDER — METFORMIN HCL ER 500 MG PO TB24: 500.0000 mg | ORAL_TABLET | Freq: Two times a day (BID) | ORAL | 1 refills | Status: DC

## 2022-06-12 NOTE — Patient Instructions (Addendum)
It was great seeing you today!  Plan discussed at today's visit: -Stop Plavix -I will discuss with Neurology about seizure medications -Stop Lasix or take half pill as needed for swelling -Referrals to pharmacy, case management and physical therapy placed -Metformin switched to extended release version to help with diarrhea - take 2 pills twice a day   Follow up in: 4 weeks   Take care and let us know if you have any questions or concerns prior to your next visit.  Dr. Rosana Berger

## 2022-06-12 NOTE — Progress Notes (Signed)
Established Patient Office Visit  Subjective   [name]: Stephanie [name], female    DOB: March 13, 1966  Age: 56 y.o. MRN: [id number]  Chief Complaint  Patient presents with   Follow-up    HPI Patient is here for follow up.  She is here with her step-son.   Multiple falls: -[place] again at home a few days ago, fell forward on her face.  She denies feeling dizzy or lightheaded or feeling like she is in a pass out.  Feels like her legs will give out on her. -She had fallen previously and stated that it is because she is on 4 different blood pressure medicines, the patient brought all of her medications in with her today and other than Lasix as needed for swelling which she is not currently taking there are no blood pressure medications. -She does have history of hemorrhagic stroke from 2017, which greatly affected her balance and coordination.  Patient states she is not very active and sits in her recliner all day at home.  Diabetes, Type 2: -Last A1c 9/23 7.8% -Medications: Metformin 1000 mg BID, added Rybelsus 3 mg at LOV but the patient cannot afford this medication (had been on Glipizide in the past) -Patient is reporting diarrhea with the metformin -Checking BG at home: 200 fasting this morning  -Diet: eats sausage sandwich on wheat bread, no lunch  -Eye exam: UTD 8/23 -Foot exam: UTD 9/23 -Microalbumin: UTD 6/23 -Statin: yes -PNA vaccine: UTD -Denies symptoms of hypoglycemia, polyuria, polydipsia, numbness extremities, foot ulcers/trauma.   HLD/Hx of CVA - left posterior temporal hemorrhage in 04/2016 and old posterior left temporal lobe/infarct and parietal lobe infarcts: -CVA August 2017, follows with Neurology -Medications: Crestor 20 mg, Plavix 75 mg  -Was also told that she had a seizure prior to her stroke in 2017 and is currently on Lamictal 100 mg and Keppra 250 mg BID per Neurology  -Patient is compliant with above medications and reports no side effects.  -Last lipid  panel: Lipid Panel     Component Value Date/Time   CHOL 123 02/13/2022 1211   TRIG 69 02/13/2022 1211   HDL 73 02/13/2022 1211   CHOLHDL 1.7 02/13/2022 1211   VLDL 18 04/22/2016 0357   LDLCALC 35 02/13/2022 1211   COPD: -COPD status: stable -Current medications: Breztri 160, Albuterol PRN -Satisfied with current treatment?: yes -Oxygen use: no -Dyspnea frequency: occasional  -Cough frequency: no -Rescue inhaler frequency:  3 times daily right now but she is out of her Breztri  -Limitation of activity: yes -Productive cough: no -Pneumovax: Not up to Date -Influenza: Not up to Date -Former smoker; quit smoking 6 months ago   Bilateral Nephrolithiasis: -Chronic, following with Urology  GERD: -Currently on Prilosec 40 mg daily   Bipolar/Depression: -Follows with Psychiatry at [facility]  -Currently on Abilify 15 mg, Caplyta 42 mg, Prazosin 4 mg at night, Viibryd 40 mg, Trazodone 150 mg and Valium 10 mg taking 2 pills at night   Patient Active Problem List   Diagnosis Date Noted   Hypokalemia 04/17/2022   Leg swelling 02/13/2022   Wheezing 02/13/2022   Migraine 05/12/2020   Type 2 diabetes mellitus ([place]) 08/22/2018   Other specified cardiac dysrhythmias 08/22/2018   Acute encephalopathy 08/21/2018   Kidney stone 03/10/2018   UTI (urinary tract infection) 08/21/2017   History of CVA (cerebrovascular accident) 01/28/2017   History of migraine 01/28/2017   Small bowel obstruction ([facility]) 09/02/2016   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  Bipolar disorder ([place]) 05/09/2016   Hyperlipidemia, unspecified 05/09/2016   Irritable bowel syndrome 05/09/2016   Lumbago with sciatica 05/09/2016   Esophageal reflux 05/09/2016   Scoliosis 05/09/2016   Diverticulosis of colon 05/09/2016   DDD (degenerative disc disease), cervical 05/09/2016   ICH (intracerebral hemorrhage) ([place])    Essential hypertension, malignant 04/19/2016   Cytotoxic brain edema ([place]) 04/19/2016   IVH  (intraventricular hemorrhage) ([place]) 04/18/2016   Bilateral carpal tunnel syndrome 04/12/2016   Falls frequently 01/12/2016   Trigeminal neuralgia 06/08/2015   Sacroiliac dysfunction 03/04/2014   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 or radiculopathy 05/20/2013   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   Tobacco use disorder 09/11/2012   History of cervical cancer 07/30/2012   Hepatitis C    Past Medical History:  Diagnosis Date   Closed fracture of shaft of humerus 01/20/2017   Diabetes mellitus without complication ([place])    Difficult intubation    Diverticulitis    Headache    Hepatitis C    treated and resolved   History of kidney stones    Hypertension    Hypotension 08/22/2018   IBS (irritable bowel syndrome)    Kidney stones    Sciatica    Sleep apnea    on cpap   Stroke ([place]) 2017   memory loss   Superficial venous thrombosis of arm, left 07/22/2016   Past Surgical History:  Procedure Laterality Date   ABDOMINAL HYSTERECTOMY  2007   carpel tunnel syndrome  2017   HARDWARE REMOVAL Right 12/22/2017   Procedure: HARDWARE REMOVAL-RIGHT ARM;  Surgeon: Lovell Sheehan, MD;  Location: [facility] ORS;  Service: Orthopedics;  Laterality: Right;  Removal of implant, superficial right humerus   HUMERUS IM NAIL Right 03/11/2017   Procedure: INTRAMEDULLARY (IM) NAIL HUMERAL;  Surgeon: Lovell Sheehan, MD;  Location: [facility];  Service: Orthopedics;  Laterality: Right;   KIDNEY STONE SURGERY Right 02/12/2012   KIDNEY STONE SURGERY Left 07/15/2012   LIGATION OF ARTERIOVENOUS  FISTULA Left 06/21/2016   Procedure: LIGATION OF ARTERIOVENOUS  FISTULA ( LIGATION BASILIC VEIN );  Surgeon: Katha Cabal, MD;  Location: [facility];  Service: Vascular;  Laterality: Left;   LITHOTRIPSY     OOPHORECTOMY     SINUS EXPLORATION     TONSILLECTOMY     Social History   Tobacco Use   Smoking status: Every Day    Packs/day: 1.00    Years: 1.50    Total pack years: 1.50     Types: Cigarettes   Smokeless tobacco: Never  Vaping Use   Vaping Use: Never used  Substance Use Topics   Alcohol use: Not Currently    Comment: no alcohol since 2002   Drug use: No   Social History   Socioeconomic History   Marital status: Legally Separated    Spouse name: Not on file   Number of children: Not on file   Years of education: Not on file   Highest education level: Not on file  Occupational History   Not on file  Tobacco Use   Smoking status: Every Day    Packs/day: 1.00    Years: 1.50    Total pack years: 1.50    Types: Cigarettes   Smokeless tobacco: Never  Vaping Use   Vaping Use: Never used  Substance and Sexual Activity   Alcohol use: Not Currently    Comment: no alcohol since 2002   Drug use: No   Sexual activity: Not on file  Other Topics Concern   Not on file  Social History Narrative   Not on file   Social Determinants of Health   Financial Resource Strain: Not on file  Food Insecurity: No Food Insecurity (05/08/2022)   Hunger Vital Sign    Worried About Running Out of Food in the Last Year: Never true    Ran Out of Food in the Last Year: Never true  Transportation Needs: Unmet Transportation Needs (05/08/2022)   PRAPARE - Hydrologist (Medical): Yes    Lack of Transportation (Non-Medical): No  Physical Activity: Not on file  Stress: Not on file  Social Connections: Not on file  Intimate Partner Violence: Not on file   Family Status  Relation Name Status   Mother  Deceased   Father  Alive   Sister  Deceased   Family History  Problem Relation Age of Onset   Heart failure Mother    Diabetes Father    Cancer Sister    Allergies  Allergen Reactions   Gabapentin Itching   Ibuprofen Itching   Sulfa Antibiotics Hives   Tylenol [Acetaminophen] Itching   Aspirin Itching   Buprenorphine Hcl Itching   Carbamazepine Itching, Other (See Comments) and Rash    Makes her feel like something is crawling under  her skin.   Codeine Itching and Other (See Comments)    Makes her feel like something is crawling under her skin. Can take, sometimes makes her itch   Compazine Itching and Other (See Comments)    "makes my skin crawl"   Elemental Sulfur Hives, Rash and Other (See Comments)    Skin Rashes, Hives   Ioxaglate Itching   Ivp Dye [Iodinated Contrast Media] Itching and Other (See Comments)    Makes her itch really bad. Had to get 2-3 shots of Benadryl when she was in the hospital.   Metrizamide Itching   Naproxen Itching   Norco [Hydrocodone-Acetaminophen] Itching   Other Itching and Other (See Comments)    Makes her itch really bad. Had to get 2-3 shots of Benadryl when she was in the hospital.   Penicillin G Hives, Rash and Other (See Comments)    Has patient had a PCN reaction causing immediate rash, facial/tongue/throat swelling, SOB or lightheadedness with hypotension: Yes Has patient had a PCN reaction causing severe rash involving mucus membranes or skin necrosis: No Has patient had a PCN reaction that required hospitalization: No Has patient had a PCN reaction occurring within the last 10 years: No If all of the above answers are "NO", then may proceed with Cephalosporin use.    Prochlorperazine Maleate Other (See Comments)    Compazine makes her skin crawl - needs 2-3 shots of Benadryl to get relief.   Reglan [Metoclopramide] Itching and Other (See Comments)    Makes her skin crawl   Toradol [Ketorolac Tromethamine] Itching   Tramadol Itching   Zofran [Ondansetron Hcl] Itching    Review of Systems  Constitutional:  Negative for chills and fever.  Respiratory:  Negative for cough, shortness of breath and wheezing.   Cardiovascular:  Negative for chest pain.  Gastrointestinal:  Negative for abdominal pain, heartburn, nausea and vomiting.  Musculoskeletal:  Positive for falls.  Neurological:  Positive for weakness.      Objective:     BP 102/78   Pulse (!) 112   Temp  97.7 F (36.5 C)   Resp 18   Ht 5\' 2"  (1.575 m)   Wt  192 lb 11.2 oz (87.4 kg)   SpO2 90%   BMI 35.25 kg/m  BP Readings from Last 3 Encounters:  06/12/22 102/78  06/05/22 133/85  05/31/22 116/73   Wt Readings from Last 3 Encounters:  06/12/22 192 lb 11.2 oz (87.4 kg)  06/05/22 197 lb (89.4 kg)  05/29/22 194 lb 14.4 oz (88.4 kg)     Physical Exam Constitutional:      Appearance: Normal appearance.  HENT:     Head: Normocephalic and atraumatic.  Eyes:     Conjunctiva/sclera: Conjunctivae normal.  Cardiovascular:     Rate and Rhythm: Normal rate and regular rhythm.  Pulmonary:     Effort: Pulmonary effort is normal.     Breath sounds: Normal breath sounds.  Musculoskeletal:     Right lower leg: No edema.     Left lower leg: No edema.  Skin:    General: Skin is warm and dry.  Neurological:     Mental Status: She is alert. Mental status is at baseline.  Psychiatric:        Attention and Perception: Attention normal.        Mood and Affect: Affect is flat.        Speech: Speech is delayed.     No results found for any visits on 06/12/22.   Last CBC Lab Results  Component Value Date   WBC 6.1 05/31/2022   HGB 13.9 05/31/2022   HCT 41.7 05/31/2022   MCV 93.5 05/31/2022   MCH 31.2 05/31/2022   RDW 14.1 05/31/2022   PLT 203 [id number]   Last metabolic panel Lab Results  Component Value Date   GLUCOSE 153 (H) 05/31/2022   NA 141 05/31/2022   K 3.0 (L) 05/31/2022   CL 102 05/31/2022   CO2 31 05/31/2022   BUN 12 05/31/2022   CREATININE 0.75 05/31/2022   GFRNONAA >60 05/31/2022   CALCIUM 10.3 05/31/2022   PHOS 2.3 (L) 08/22/2018   PROT 7.4 04/16/2022   ALBUMIN 3.9 04/16/2022   BILITOT 0.5 04/16/2022   ALKPHOS 96 04/16/2022   AST 30 04/16/2022   ALT 34 04/16/2022   ANIONGAP 8 05/31/2022   Last lipids Lab Results  Component Value Date   CHOL 123 02/13/2022   HDL 73 02/13/2022   LDLCALC 35 02/13/2022   TRIG 69 02/13/2022   CHOLHDL 1.7 02/13/2022    Last hemoglobin A1c Lab Results  Component Value Date   HGBA1C 7.8 (A) 05/29/2022   Last thyroid functions Lab Results  Component Value Date   TSH 1.218 04/16/2022   Last vitamin D No results found for: "25OHVITD2", "25OHVITD3", "VD25OH" Last vitamin B12 and Folate Lab Results  Component Value Date   [id number] 02/20/2022      The ASCVD Risk score (Arnett DK, et al., 2019) failed to calculate for the following reasons:   The patient has a prior MI or stroke diagnosis    Assessment & Plan:   1. Polypharmacy: Patient brought all of her medications here today for review. I am concerned, especially with multiple recent falls that her risk of interaction with her medications is high. I'm reaching out to her specialists to confirm her medication list, in the mean time a referral to care management for both pharmacy and case management services will be placed today.   - AMB Referral to Chronic Care Management Services  2. Ambulatory dysfunction/ Falls frequently/History of intraventricular hemorrhage: Risk of side effects/medication interactions high with her current medications, also history of  multiple CVA's in the past contributing to fall risk. Referral to PT placed to help with strengthening to reduce fall risk.   - Ambulatory referral to Physical Therapy  3. Type 2 diabetes mellitus without complication, without long-term current use of insulin ([place]): Having side effects from Metformin, switch to extended release version still 1000 mg BID. Patient could not afford Rybelsus, however she has a history of multiple UTI's (3 this year according to her stepson) and I do not think injections would be a good choice for her. Glipizide is too risky for hypoglycemia, referral placed to pharmacy to see if she would qualifiy for any financial help for the Rybelsus.   - metFORMIN (GLUCOPHAGE-XR) 500 MG 24 hr tablet; Take 1 tablet (500 mg total) by mouth 2 (two) times daily with a meal.   Dispense: 180 tablet; Refill: 1   Return in about 4 weeks (around 07/10/2022).    Teodora Medici, DO

## 2022-06-14 ENCOUNTER — Other Ambulatory Visit: Payer: Self-pay | Admitting: Internal Medicine

## 2022-06-14 ENCOUNTER — Telehealth: Payer: Self-pay | Admitting: Internal Medicine

## 2022-06-14 NOTE — Telephone Encounter (Signed)
Pt.notified

## 2022-06-14 NOTE — Telephone Encounter (Signed)
Copied from [place] [id number]. Topic: General - Other >> Jun 14, 2022  9:32 AM Chapman Fitch wrote: Reason for CRM: Wells Guiles from [facility] Neu[facility] called and stated that pt is not taking keppra but she is taking Lemictal and plavix / this message is for [facility]

## 2022-06-17 ENCOUNTER — Telehealth (INDEPENDENT_AMBULATORY_CARE_PROVIDER_SITE_OTHER): Payer: Medicaid Other | Admitting: Family Medicine

## 2022-06-17 ENCOUNTER — Ambulatory Visit: Payer: Self-pay | Admitting: *Deleted

## 2022-06-17 VITALS — BP 117/90 | HR 84

## 2022-06-17 DIAGNOSIS — R197 Diarrhea, unspecified: Secondary | ICD-10-CM

## 2022-06-17 DIAGNOSIS — R42 Dizziness and giddiness: Secondary | ICD-10-CM

## 2022-06-17 DIAGNOSIS — E876 Hypokalemia: Secondary | ICD-10-CM | POA: Diagnosis not present

## 2022-06-17 DIAGNOSIS — E1165 Type 2 diabetes mellitus with hyperglycemia: Secondary | ICD-10-CM

## 2022-06-17 NOTE — Progress Notes (Signed)
Name: Stephanie [name]   MRN: [id number]    DOB: 12/26/1965   Date:06/17/2022       Progress Note  Subjective:    Chief Complaint  Chief Complaint  Patient presents with   Dizziness   Diarrhea    Sx for over 2 months    I connected with  [name]  on 06/17/22 at  1:20 PM EDT by a video enabled telemedicine application and verified that I am speaking with the correct person using two identifiers.  I discussed the limitations of evaluation and management by telemedicine and the availability of in person appointments. The patient expressed understanding and agreed to proceed. Staff also discussed with the patient that there may be a patient responsible charge related to this service. Patient Location: home  Provider Location: [facility] clinic Additional Individuals present: none   1:27 PM On video without pt joining virtual video visit -pt states she will join  1:32 PM Connected to video  HPI Presents for dizziness and diarrhea but then she states her sugars are high Sugars 200-300+ for 1 month She is on metformin and she had glipizide stopped  Lab Results  Component Value Date   HGBA1C 7.8 (A) 05/29/2022  With glipizide in the past cbg's stayed 140-170  She complains of loose stool/diarrhea - described as watery - she has need to go always after eating  (occurs with everything except chicken and porch chops) she specifically says keto yogurt and spaghetti, diarrhea onset 3 or 4 months  Dizziness -  She has intermittent dizziness when she stands up too fast, she waits a few minutes and it goes away, last week she fell forward and "busted her nose"  She did ER visit for the fall and injury 05/31/21 PCP saw her on 9/20 and then again for close f/up on 10/5 Notes of sugars 200  She is on diuretic - LE edema is better. Peeing a lot Diarrhea 3-4 x a day for 2 weeks then 2 months then more than 4 months - she states she has been on metformin for a long time prior to diarrhea  sx       Patient Active Problem List   Diagnosis Date Noted   Hypokalemia 04/17/2022   Leg swelling 02/13/2022   Wheezing 02/13/2022   Migraine 05/12/2020   Type 2 diabetes mellitus ([place]) 08/22/2018   Other specified cardiac dysrhythmias 08/22/2018   Acute encephalopathy 08/21/2018   Kidney stone 03/10/2018   UTI (urinary tract infection) 08/21/2017   History of CVA (cerebrovascular accident) 01/28/2017   History of migraine 01/28/2017   Small bowel obstruction ([place]) 09/02/2016   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   Bipolar disorder ([place]) 05/09/2016   Hyperlipidemia, unspecified 05/09/2016   Irritable bowel syndrome 05/09/2016   Lumbago with sciatica 05/09/2016   Esophageal reflux 05/09/2016   Scoliosis 05/09/2016   Diverticulosis of colon 05/09/2016   DDD (degenerative disc disease), cervical 05/09/2016   ICH (intracerebral hemorrhage) ([place])    Essential hypertension, malignant 04/19/2016   Cytotoxic brain edema ([place]) 04/19/2016   IVH (intraventricular hemorrhage) ([place]) 04/18/2016   Bilateral carpal tunnel syndrome 04/12/2016   Falls frequently 01/12/2016   Trigeminal neuralgia 06/08/2015   Sacroiliac dysfunction 03/04/2014   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 or radiculopathy 05/20/2013   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   Tobacco use disorder 09/11/2012   History of cervical cancer 07/30/2012   Hepatitis C     Social History   Tobacco Use  Smoking status: Every Day    Packs/day: 1.00    Years: 1.50    Total pack years: 1.50    Types: Cigarettes   Smokeless tobacco: Never  Substance Use Topics   Alcohol use: Not Currently    Comment: no alcohol since 2002     Current Outpatient Medications:    albuterol (VENTOLIN HFA) 108 (90 Base) MCG/ACT inhaler, Inhale 2 puffs into the lungs every 6 (six) hours as needed for wheezing or shortness of breath., Disp: 8 g, Rfl: 0   ARIPiprazole (ABILIFY) 15 MG tablet, Take 15 mg by mouth every  morning., Disp: , Rfl:    BREZTRI AEROSPHERE 160-9-4.8 MCG/ACT AERO, Inhale 2 puffs into the lungs 2 (two) times daily., Disp: 5.9 g, Rfl: 3   Continuous Blood Gluc Sensor (FREESTYLE LIBRE 2 SENSOR) MISC, USE WITH READER TO CHECK SUGARS DAILY, Disp: , Rfl:    diazepam (VALIUM) 10 MG tablet, Take 10 mg by mouth 3 (three) times daily., Disp: , Rfl:    furosemide (LASIX) 40 MG tablet, Take 1 tablet (40 mg total) by mouth 2 (two) times daily as needed for edema (take 40 mg in the morning, can take additional 40 mg in afternoon if needed for edema)., Disp: 60 tablet, Rfl: 3   lamoTRIgine (LAMICTAL) 100 MG tablet, Take 100 mg by mouth 2 (two) times daily., Disp: , Rfl:    metFORMIN (GLUCOPHAGE-XR) 500 MG 24 hr tablet, Take 1 tablet (500 mg total) by mouth 2 (two) times daily with a meal., Disp: 180 tablet, Rfl: 1   nitrofurantoin, macrocrystal-monohydrate, (MACROBID) 100 MG capsule, Take 1 capsule (100 mg total) by mouth every 12 (twelve) hours., Disp: 14 capsule, Rfl: 0   omeprazole (PRILOSEC) 40 MG capsule, Take 1 capsule (40 mg total) by mouth daily., Disp: 30 capsule, Rfl: 0   prazosin (MINIPRESS) 2 MG capsule, Take 4 mg by mouth every evening., Disp: , Rfl:    traZODone (DESYREL) 150 MG tablet, Take by mouth at bedtime., Disp: , Rfl:    Vilazodone HCl (VIIBRYD) 40 MG TABS, Take 40 mg by mouth daily., Disp: , Rfl:    CAPLYTA 42 MG capsule, Take 42 mg by mouth every evening. (Patient not taking: Reported on 06/12/2022), Disp: , Rfl:    clopidogrel (PLAVIX) 75 MG tablet, Take 75 mg by mouth daily. (Patient not taking: Reported on 06/12/2022), Disp: , Rfl:   Allergies  Allergen Reactions   Gabapentin Itching   Ibuprofen Itching   Sulfa Antibiotics Hives   Tylenol [Acetaminophen] Itching   Aspirin Itching   Buprenorphine Hcl Itching   Carbamazepine Itching, Other (See Comments) and Rash    Makes her feel like something is crawling under her skin.   Codeine Itching and Other (See Comments)    Makes  her feel like something is crawling under her skin. Can take, sometimes makes her itch   Compazine Itching and Other (See Comments)    "makes my skin crawl"   Elemental Sulfur Hives, Rash and Other (See Comments)    Skin Rashes, Hives   Ioxaglate Itching   Ivp Dye [Iodinated Contrast Media] Itching and Other (See Comments)    Makes her itch really bad. Had to get 2-3 shots of Benadryl when she was in the hospital.   Metrizamide Itching   Naproxen Itching   Norco [Hydrocodone-Acetaminophen] Itching   Other Itching and Other (See Comments)    Makes her itch really bad. Had to get 2-3 shots of Benadryl when she was  in the hospital.   Penicillin G Hives, Rash and Other (See Comments)    Has patient had a PCN reaction causing immediate rash, facial/tongue/throat swelling, SOB or lightheadedness with hypotension: Yes Has patient had a PCN reaction causing severe rash involving mucus membranes or skin necrosis: No Has patient had a PCN reaction that required hospitalization: No Has patient had a PCN reaction occurring within the last 10 years: No If all of the above answers are "NO", then may proceed with Cephalosporin use.    Prochlorperazine Maleate Other (See Comments)    Compazine makes her skin crawl - needs 2-3 shots of Benadryl to get relief.   Reglan [Metoclopramide] Itching and Other (See Comments)    Makes her skin crawl   Toradol [Ketorolac Tromethamine] Itching   Tramadol Itching   Zofran [Ondansetron Hcl] Itching   Chart review: I personally reviewed active problem list, medication list, allergies, family history, social history, health maintenance, notes from last encounter, lab results, imaging with the patient/caregiver today. i   Review of Systems    Objective:   Virtual encounter, vitals limited, only able to obtain the following Today's Vitals   06/17/22 1343  BP: (!) 117/90  Pulse: 84   There is no height or weight on file to calculate BMI. Nursing Note and  Vital Signs reviewed.  Physical Exam Vitals reviewed.  Constitutional:      General: She is not in acute distress.    Appearance: She is obese. She is not toxic-appearing or diaphoretic.  Psychiatric:        Speech: Speech is delayed, slurred and tangential.        Behavior: Behavior is slowed.        Cognition and Memory: Memory is impaired.     PE limited by virtual encounter  No results found for this or any previous visit (from the past 72 hour(s)).  Assessment and Plan:     ICD-10-CM   1. Type 2 diabetes mellitus with hyperglycemia, without long-term current use of insulin (HCC)  E11.65    blood sugars 200-300+, multiple recent visits with ER and PCP, will consult with PCP about what meds she prefers to add    2. Hypokalemia  E87.6    per ER labs after recent fall- hold lasix, may need recheck labs and supplement if still low -she thinks she has potassium at home but not sure    3. Dizziness  R42    likely multiple factors - polypharmacy, hx of stroke, hypovolemia - diarrhea, diuretic and hyperglycemia, BP 119/90 at home, needs to be seen in person    4. Diarrhea, unspecified type  R19.7    ongoing for 2 weeks, 2 months, and 4+ months, very difficult hx, she reports 3-4 watery bm per day, concern for hypovolemia and electrolyte abnormality    Pt advised to get appt in office to reassess fluid status, possibly check labs - prefer she see her PCP who has seen her multiple times recently and with difficult hx it would be best for her to see her to determine if sig different from her baseline appearance at recent visits.  Will ask PCP about the f/up visit and any preferred meds for hyperglycemia.  2:55 PM Discussed with Dr. Rosana Berger - advised to hold lasix - she should not still be taking that - it was for rescue/short term use only CBGs have not been reported as high as she is stating today - Dr. Loni Muse preferred not to give glypizide  and she has been working on getting pt approved  for rybelsus - multiple other meds she cannot due - inability to given herself injectable, and unable to take SGLT2 inhibitors. Diarrhea - unclear - she has not reported this hx of PCP despite multiple recent visits and specific discussion about GI sx and tolerance of metformin, it was recently changed to XR She may need separate visit to discuss that since sx are newly reported    -Red flags and when to present for emergency care or RTC including chest pain, shortness of breath, new/worsening/un-resolving symptoms, confusion, syncope or near syncope that is new and worse than baselien - reviewed with patient at time of visit. Follow up and care instructions discussed and provided in AVS. - I discussed the assessment and treatment plan with the patient. The patient was provided an opportunity to ask questions and all were answered. The patient agreed with the plan and demonstrated an understanding of the instructions.  I provided 20+ minutes of non-face-to-face time during this encounter.  Delsa Grana, PA-C 06/17/22 1:27 PM

## 2022-06-17 NOTE — Telephone Encounter (Signed)
  Chief Complaint: dizziness, diarrhea Symptoms: weak Frequency: 2 months off and on Pertinent Negatives: Patient denies fever Disposition: [] ED /[] Urgent Care (no appt availability in office) / [x] Appointment(In office/virtual)/ []  [facility]/ [] Home Care/ [] Refused Recommended Disposition /[] [facility]/ []  Follow-up with PCP Additional Notes: MyChart visit with Delsa Grana today, homecare discussed.   Reason for Disposition  [1] MILD dizziness (e.g., walking normally) AND [2] has NOT been evaluated by doctor (or NP/PA) for this  (Exception: Dizziness caused by heat exposure, sudden standing, or poor fluid intake.)  Answer Assessment - Initial Assessment Questions 1. DESCRIPTION: "Describe your dizziness."     Light headed, sometimes spins 2. LIGHTHEADED: "Do you feel lightheaded?" (e.g., somewhat faint, woozy, weak upon standing)     yes 3. VERTIGO: "Do you feel like either you or the room is spinning or tilting?" (i.e. vertigo)     occassionally 4. SEVERITY: "How bad is it?"  "Do you feel like you are going to faint?" "Can you stand and walk?"   - MILD: Feels slightly dizzy, but walking normally.   - MODERATE: Feels unsteady when walking, but not falling; interferes with normal activities (e.g., school, work).   - SEVERE: Unable to walk without falling, or requires assistance to walk without falling; feels like passing out now.      4 5. ONSET:  "When did the dizziness begin?"     When stands 6. AGGRAVATING FACTORS: "Does anything make it worse?" (e.g., standing, change in head position)     standing 7. HEART RATE: "Can you tell me your heart rate?" "How many beats in 15 seconds?"  (Note: not all patients can do this)       80's and she says even 8. CAUSE: "What do you think is causing the dizziness?"     Bp 117/93 9. RECURRENT SYMPTOM: "Have you had dizziness before?" If Yes, ask: "When was the last time?" "What happened that time?"     yes 10. OTHER  SYMPTOMS: "Do you have any other symptoms?" (e.g., fever, chest pain, vomiting, diarrhea, bleeding)       Diarrhea 5 x last night 11. PREGNANCY: "Is there any chance you are pregnant?" "When was your last menstrual period?"       no  Protocols used: Dizziness - Lightheadedness-A-AH

## 2022-06-18 ENCOUNTER — Ambulatory Visit: Payer: Self-pay | Admitting: *Deleted

## 2022-06-18 ENCOUNTER — Other Ambulatory Visit: Payer: Self-pay | Admitting: *Deleted

## 2022-06-18 NOTE — Telephone Encounter (Signed)
Pt.notified

## 2022-06-18 NOTE — Telephone Encounter (Signed)
  Chief Complaint: migraine, out of med Symptoms: frontal and side migraine Frequency: just over a week Pertinent Negatives: Patient denies fever, stiff neck Disposition: [] ED /[] Urgent Care (no appt availability in office) / [x] Appointment(In office/virtual)/ []  [facility]/ [] Home Care/ [] Refused Recommended Disposition /[] [facility]/ []  Follow-up with PCP Additional Notes: Pt has appt tomorrow for multiple problems including ? Dehydration from taking lasix consistently even while severe diarrhea instead of just as needed. Today she is out of Migraine meds, takes two different meds, Nurtec and she did not know the other but starts with an A she thinks. Appt tomorrow with Dr Rosana Berger, asking for both migraine meds to be called in today. (Not on current list so I can not just request and fill them.)   Reason for Disposition  Similar to previously diagnosed migraine headaches  Answer Assessment - Initial Assessment Questions 1. LOCATION: "Where does it hurt?"      Right side and some in forehead 2. ONSET: "When did the headache start?" (Minutes, hours or days)      8 days 3. PATTERN: "Does the pain come and go, or has it been constant since it started?"     steady 4. SEVERITY: "How bad is the pain?" and "What does it keep you from doing?"  (e.g., Scale 1-10; mild, moderate, or severe)   - MILD (1-3): doesn't interfere with normal activities    - MODERATE (4-7): interferes with normal activities or awakens from sleep    - SEVERE (8-10): excruciating pain, unable to do any normal activities        9 5. RECURRENT SYMPTOM: "Have you ever had headaches before?" If Yes, ask: "When was the last time?" and "What happened that time?"      migraines 6. CAUSE: "What do you think is causing the headache?"     migraine 7. MIGRAINE: "Have you been diagnosed with migraine headaches?" If Yes, ask: "Is this headache similar?"      yes 8. HEAD INJURY: "Has there been any recent  injury to the head?"      Bleeding stroke 9. OTHER SYMPTOMS: "Do you have any other symptoms?" (fever, stiff neck, eye pain, sore throat, cold symptoms)     no 10. PREGNANCY: "Is there any chance you are pregnant?" "When was your last menstrual period?"       no  Protocols used: Headache-A-AH

## 2022-06-18 NOTE — Telephone Encounter (Signed)
Pt notified.  She states leisa took her off metformin yesterday or told her to stop taking sugar is running high at 290 this am

## 2022-06-19 ENCOUNTER — Encounter: Payer: Self-pay | Admitting: Internal Medicine

## 2022-06-19 ENCOUNTER — Ambulatory Visit: Payer: Medicaid Other | Admitting: Internal Medicine

## 2022-06-19 VITALS — BP 120/64 | HR 92 | Temp 97.8°F | Resp 16 | Ht 62.0 in | Wt 195.3 lb

## 2022-06-19 DIAGNOSIS — R197 Diarrhea, unspecified: Secondary | ICD-10-CM | POA: Diagnosis not present

## 2022-06-19 DIAGNOSIS — Z79899 Other long term (current) drug therapy: Secondary | ICD-10-CM | POA: Diagnosis not present

## 2022-06-19 NOTE — Patient Instructions (Addendum)
It was great seeing you today!  Plan discussed at today's visit: -Restart Plavix -Stop Levetiracetam (I took this out of your bag) -Only take Furosemide for swelling because it can make your blood pressure too low -Pick up Metformin extended release from pharmacy today to help with diarrhea  Follow up in: already scheduled on 11/1  Take care and let us know if you have any questions or concerns prior to your next visit.  Dr. Rosana Berger

## 2022-06-19 NOTE — Progress Notes (Signed)
Established Patient Office Visit  Subjective   [name]: Stephanie [name], female    DOB: 1966/02/14  Age: 56 y.o. MRN: [id number]  Chief Complaint  Patient presents with   Diabetes   Migraine    HPI Patient is here for diarrhea and dizziness.  She is here with her step-son.   Multiple falls: -History of multiple falls, last of which was last week.  She has not had any falls since our last visit.  She denies feeling dizzy or lightheaded or feeling like she is in a pass out.  Feels like her legs will give out on her. -She had fallen previously and stated that it is because she is on 4 different blood pressure medicines, the patient brought all of her medications in with her today and other than Lasix as needed for swelling which she is not currently taking there are no blood pressure medications. -She does have history of hemorrhagic stroke from 2017, which greatly affected her balance and coordination.  Patient states she is not very active and sits in her recliner all day at home. -Referral to physical therapy placed at last office visit, currently coordinating  Diabetes, Type 2: -Last A1c 9/23 7.8% -Medications: Metformin 1000 mg BID switched to ER version last week due to diarrhea, added Rybelsus 3 mg at LOV but the patient cannot afford this medication (had been on Glipizide in the past) -The patient has not picked up her new prescription for the metformin extended release, I reiterated to her today that her diarrhea is a side effect of the metformin she needs to pick up new prescription to help with this side effect -Checking BG at home: 214  fasting this morning  -Diet: eats sausage sandwich on wheat bread, no lunch  -Eye exam: UTD 8/23 -Foot exam: UTD 9/23 -Microalbumin: UTD 6/23 -Statin: yes -PNA vaccine: UTD -Denies symptoms of hypoglycemia, polyuria, polydipsia, numbness extremities, foot ulcers/trauma.   HLD/Hx of CVA - left posterior temporal hemorrhage in 04/2016 and  old posterior left temporal lobe/infarct and parietal lobe infarcts/Seizure Disorder: -CVA August 2017, follows with Neurology -Medications: Crestor 20 mg, Plavix 75 mg -Was also told that she had a seizure prior to her stroke in 2017 and is currently on Lamictal 100 mg (had been on Keppra 250 mg BID as well but Neurology confirmed she is not to be taking this). -Patient is compliant with above medications and reports no side effects.  -Last lipid panel: Lipid Panel     Component Value Date/Time   CHOL 123 02/13/2022 1211   TRIG 69 02/13/2022 1211   HDL 73 02/13/2022 1211   CHOLHDL 1.7 02/13/2022 1211   VLDL 18 04/22/2016 0357   LDLCALC 35 02/13/2022 1211   COPD: -COPD status: stable -Current medications: Breztri 160, Albuterol PRN -Satisfied with current treatment?: yes -Oxygen use: no -Dyspnea frequency: occasional  -Cough frequency: no -Rescue inhaler frequency:  3 times daily right now but she is out of her Breztri  -Limitation of activity: yes -Productive cough: no -Pneumovax: Not up to Date -Influenza: Not up to Date -Former smoker; quit smoking 6 months ago   Bilateral Nephrolithiasis: -Chronic, following with Urology  GERD: -Currently on Prilosec 40 mg daily   Bipolar/Depression: -Follows with Psychiatry at [facility]  -Currently on Abilify 15 mg, Caplyta 42 mg, Prazosin 4 mg at night, Viibryd 40 mg, Trazodone 150 mg and Valium 10 mg taking 2 pills at night   Patient Active Problem List   Diagnosis Date  Noted   Hypokalemia 04/17/2022   Leg swelling 02/13/2022   Wheezing 02/13/2022   Migraine 05/12/2020   Type 2 diabetes mellitus ([place]) 08/22/2018   Other specified cardiac dysrhythmias 08/22/2018   Acute encephalopathy 08/21/2018   Kidney stone 03/10/2018   UTI (urinary tract infection) 08/21/2017   History of CVA (cerebrovascular accident) 01/28/2017   History of migraine 01/28/2017   Small bowel obstruction ([place]) 09/02/2016   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   Bipolar disorder ([place]) 05/09/2016   Hyperlipidemia, unspecified 05/09/2016   Irritable bowel syndrome 05/09/2016   Lumbago with sciatica 05/09/2016   Esophageal reflux 05/09/2016   Scoliosis 05/09/2016   Diverticulosis of colon 05/09/2016   DDD (degenerative disc disease), cervical 05/09/2016   ICH (intracerebral hemorrhage) ([place])    Essential hypertension, malignant 04/19/2016   Cytotoxic brain edema ([place]) 04/19/2016   IVH (intraventricular hemorrhage) ([place]) 04/18/2016   Bilateral carpal tunnel syndrome 04/12/2016   Falls frequently 01/12/2016   Trigeminal neuralgia 06/08/2015   Sacroiliac dysfunction 03/04/2014   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 or radiculopathy 05/20/2013   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   Tobacco use disorder 09/11/2012   History of cervical cancer 07/30/2012   Hepatitis C    Past Medical History:  Diagnosis Date   Closed fracture of shaft of humerus 01/20/2017   Diabetes mellitus without complication ([place])    Difficult intubation    Diverticulitis    Headache    Hepatitis C    treated and resolved   History of kidney stones    Hypertension    Hypotension 08/22/2018   IBS (irritable bowel syndrome)    Kidney stones    Sciatica    Sleep apnea    on cpap   Stroke ([place]) 2017   memory loss   Superficial venous thrombosis of arm, left 07/22/2016   Past Surgical History:  Procedure Laterality Date   ABDOMINAL HYSTERECTOMY  2007   carpel tunnel syndrome  2017   HARDWARE REMOVAL Right 12/22/2017   Procedure: HARDWARE REMOVAL-RIGHT ARM;  Surgeon: Lovell Sheehan, MD;  Location: [facility] ORS;  Service: Orthopedics;  Laterality: Right;  Removal of implant, superficial right humerus   HUMERUS IM NAIL Right 03/11/2017   Procedure: INTRAMEDULLARY (IM) NAIL HUMERAL;  Surgeon: Lovell Sheehan, MD;  Location: [facility] ORS;  Service: Orthopedics;  Laterality: Right;   KIDNEY STONE SURGERY Right 02/12/2012   KIDNEY STONE  SURGERY Left 07/15/2012   LIGATION OF ARTERIOVENOUS  FISTULA Left 06/21/2016   Procedure: LIGATION OF ARTERIOVENOUS  FISTULA ( LIGATION BASILIC VEIN );  Surgeon: Katha Cabal, MD;  Location: [facility] ORS;  Service: Vascular;  Laterality: Left;   LITHOTRIPSY     OOPHORECTOMY     SINUS EXPLORATION     TONSILLECTOMY     Social History   Tobacco Use   Smoking status: Every Day    Packs/day: 1.00    Years: 1.50    Total pack years: 1.50    Types: Cigarettes   Smokeless tobacco: Never  Vaping Use   Vaping Use: Never used  Substance Use Topics   Alcohol use: Not Currently    Comment: no alcohol since 2002   Drug use: No   Social History   Socioeconomic History   Marital status: Legally Separated    Spouse name: Not on file   Number of children: Not on file   Years of education: Not on file   Highest education level: Not on file  Occupational History  Not on file  Tobacco Use   Smoking status: Every Day    Packs/day: 1.00    Years: 1.50    Total pack years: 1.50    Types: Cigarettes   Smokeless tobacco: Never  Vaping Use   Vaping Use: Never used  Substance and Sexual Activity   Alcohol use: Not Currently    Comment: no alcohol since 2002   Drug use: No   Sexual activity: Not on file  Other Topics Concern   Not on file  Social History Narrative   Not on file   Social Determinants of Health   Financial Resource Strain: Not on file  Food Insecurity: No Food Insecurity (05/08/2022)   Hunger Vital Sign    Worried About Running Out of Food in the Last Year: Never true    Ran Out of Food in the Last Year: Never true  Transportation Needs: Unmet Transportation Needs (05/08/2022)   PRAPARE - Administrator, Civil ServiceTransportation    Lack of Transportation (Medical): Yes    Lack of Transportation (Non-Medical): No  Physical Activity: Not on file  Stress: Not on file  Social Connections: Not on file  Intimate Partner Violence: Not on file   Family Status  Relation Name Status   Mother   Deceased   Father  Alive   Sister  Deceased   Family History  Problem Relation Age of Onset   Heart failure Mother    Diabetes Father    Cancer Sister    Allergies  Allergen Reactions   Gabapentin Itching   Ibuprofen Itching   Sulfa Antibiotics Hives   Tylenol [Acetaminophen] Itching   Aspirin Itching   Buprenorphine Hcl Itching   Carbamazepine Itching, Other (See Comments) and Rash    Makes her feel like something is crawling under her skin.   Codeine Itching and Other (See Comments)    Makes her feel like something is crawling under her skin. Can take, sometimes makes her itch   Compazine Itching and Other (See Comments)    "makes my skin crawl"   Elemental Sulfur Hives, Rash and Other (See Comments)    Skin Rashes, Hives   Ioxaglate Itching   Ivp Dye [Iodinated Contrast Media] Itching and Other (See Comments)    Makes her itch really bad. Had to get 2-3 shots of Benadryl when she was in the hospital.   Metrizamide Itching   Naproxen Itching   Norco [Hydrocodone-Acetaminophen] Itching   Other Itching and Other (See Comments)    Makes her itch really bad. Had to get 2-3 shots of Benadryl when she was in the hospital.   Penicillin G Hives, Rash and Other (See Comments)    Has patient had a PCN reaction causing immediate rash, facial/tongue/throat swelling, SOB or lightheadedness with hypotension: Yes Has patient had a PCN reaction causing severe rash involving mucus membranes or skin necrosis: No Has patient had a PCN reaction that required hospitalization: No Has patient had a PCN reaction occurring within the last 10 years: No If all of the above answers are "NO", then may proceed with Cephalosporin use.    Prochlorperazine Maleate Other (See Comments)    Compazine makes her skin crawl - needs 2-3 shots of Benadryl to get relief.   Reglan [Metoclopramide] Itching and Other (See Comments)    Makes her skin crawl   Toradol [Ketorolac Tromethamine] Itching   Tramadol  Itching   Zofran [Ondansetron Hcl] Itching    Review of Systems  Constitutional:  Negative for chills and fever.  Respiratory:  Negative for cough, shortness of breath and wheezing.   Cardiovascular:  Negative for chest pain.  Gastrointestinal:  Positive for diarrhea. Negative for abdominal pain, heartburn, nausea and vomiting.  Musculoskeletal:  Positive for falls.      Objective:     BP 120/64   Pulse 92   Temp 97.8 F (36.6 C)   Resp 16   Ht 5\' 2"  (1.575 m)   Wt 195 lb 4.8 oz (88.6 kg)   SpO2 95%   BMI 35.72 kg/m  BP Readings from Last 3 Encounters:  06/19/22 120/64  06/17/22 (!) 117/90  06/12/22 102/78   Wt Readings from Last 3 Encounters:  06/19/22 195 lb 4.8 oz (88.6 kg)  06/12/22 192 lb 11.2 oz (87.4 kg)  06/05/22 197 lb (89.4 kg)     Physical Exam Constitutional:      Appearance: Normal appearance.  HENT:     Head: Normocephalic and atraumatic.  Eyes:     Conjunctiva/sclera: Conjunctivae normal.  Cardiovascular:     Rate and Rhythm: Normal rate and regular rhythm.  Pulmonary:     Effort: Pulmonary effort is normal.     Breath sounds: Normal breath sounds.  Musculoskeletal:     Right lower leg: No edema.     Left lower leg: No edema.  Skin:    General: Skin is warm and dry.  Neurological:     Mental Status: She is alert. Mental status is at baseline.  Psychiatric:        Attention and Perception: Attention normal.        Mood and Affect: Affect is flat.        Speech: Speech is delayed.     No results found for any visits on 06/19/22.   Last CBC Lab Results  Component Value Date   WBC 6.1 05/31/2022   HGB 13.9 05/31/2022   HCT 41.7 05/31/2022   MCV 93.5 05/31/2022   MCH 31.2 05/31/2022   RDW 14.1 05/31/2022   PLT 203 05/31/2022   Last metabolic panel Lab Results  Component Value Date   GLUCOSE 153 (H) 05/31/2022   NA 141 05/31/2022   K 3.0 (L) 05/31/2022   CL 102 05/31/2022   CO2 31 05/31/2022   BUN 12 05/31/2022    CREATININE 0.75 05/31/2022   GFRNONAA >60 05/31/2022   CALCIUM 10.3 05/31/2022   PHOS 2.3 (L) 08/22/2018   PROT 7.4 04/16/2022   ALBUMIN 3.9 04/16/2022   BILITOT 0.5 04/16/2022   ALKPHOS 96 04/16/2022   AST 30 04/16/2022   ALT 34 04/16/2022   ANIONGAP 8 05/31/2022   Last lipids Lab Results  Component Value Date   CHOL 123 02/13/2022   HDL 73 02/13/2022   LDLCALC 35 02/13/2022   TRIG 69 02/13/2022   CHOLHDL 1.7 02/13/2022   Last hemoglobin A1c Lab Results  Component Value Date   HGBA1C 7.8 (A) 05/29/2022   Last thyroid functions Lab Results  Component Value Date   TSH 1.218 04/16/2022   Last vitamin D No results found for: "25OHVITD2", "25OHVITD3", "VD25OH" Last vitamin B12 and Folate Lab Results  Component Value Date   VITAMINB12 380 02/20/2022      The ASCVD Risk score (Arnett DK, et al., 2019) failed to calculate for the following reasons:   The patient has a prior MI or stroke diagnosis    Assessment & Plan:   1. Diarrhea, unspecified type: Patient is having diarrhea as a side effect from her metformin, I sent  in a new prescription for metformin extended release to her pharmacy last week, which the patient did not pick up yet.  Discussed today that she needs to pick up that medication to start today to help with her diarrhea.  2. Medication management: Continue to go through medications, she will restart her Plavix and discontinue the Keppra which was taken off her list.  She will only take her Lasix as needed for swelling which she currently does not have to avoid hypotension and dizziness.  Referral previously placed to pharmacy to review medications.  Follow-up already scheduled here for 11/1.   Return for already scheduled .    Margarita Mail, DO

## 2022-06-19 NOTE — Patient Instructions (Signed)
Visit Information  [name] was given information about Medicaid Managed Care team care coordination services as a part of their [facility] Medicaid benefit. Stephanie [name] verbally consented to engagement with the [facility] Managed Care team.   If you are experiencing a medical emergency, please call 911 or report to your local emergency department or urgent care.   If you have a non-emergency medical problem during routine business hours, please contact your provider's office and ask to speak with a nurse.   For questions related to your [facility] health plan, please call: [phone] or go here:[email or web address]  If you would like to schedule transportation through your [facility] plan, please call the following number at least 2 days in advance of your appointment: [phone].  You can also use the MTM portal or MTM mobile app to manage your rides. For the portal, please go to [email or web address].  Call the [place] at [phone], at any time, 24 hours a day, 7 days a week. If you are in danger or need immediate medical attention call 911.  If you would like help to quit smoking, call 1-800-QUIT-NOW 724-828-0567) OR Espaol: 1-855-Djelo-Ya QO:409462) o para ms informacin haga clic aqu or Text READY to 200-400 to register via text  Stephanie [name],   Please see education materials related to DM provided by MyChart link.  Patient verbalizes understanding of instructions and care plan provided today and agrees to view in [place]. Active MyChart status and patient understanding of how to access instructions and care plan via MyChart confirmed with patient.     Telephone follow up appointment with Managed Medicaid care management team member scheduled for:07/23/22 @ 3:30pm  Lurena Joiner RN, BSN [place] RN Care Coordinator   Following is a copy of your plan of care:  Care Plan : RN Care Manager  Plan of Care  Updates made by Melissa Montane, RN since 06/19/2022 12:00 AM     Problem: Development of Plan of Care to address Health Management needs related to Stroke history      Long-Range Goal: Development of Plan of Care to address Health Management needs related to Stroke history   Start Date: 05/08/2022  Expected End Date: 08/06/2022  Priority: High  Note:   Current Barriers:  Chronic Disease Management support and education needs related to Improving Health with history of Stroke Patient having difficulty affording her medications and sensors. Freestyle Libre 14 day sensors, Breztri and Rebelsus are not covered by [employer]. Patient has a PCP appointment on 06/19/22. She has been unable to manage her BS which have been as high as 300. She has not heard from home health regarding PT.  RNCM Clinical Goal(s):  Patient will verbalize understanding of plan for management of Improving Health with history of stroke as evidenced by patient reports attend all scheduled medical appointments: 06/19/22 with PCP, 07/03/22 with GI and 07/10/22 with PCP as evidenced by provider documentation in EMR        continue to work with RN Care Manager and/or Social Worker to address care management and care coordination needs related to improving health with history of stroke as evidenced by adherence to CM Team Scheduled appointments     through collaboration with Consulting civil engineer, provider, and care team.   Interventions: Inter-disciplinary care team collaboration (see longitudinal plan of care) Evaluation of current treatment plan related to  self management and patient's adherence to plan as established by provider Collaborate with PCP regarding  patient needing medications covered by Medicaid   Diabetes Interventions:  (Status:  New goal.) Long Term Goal Assessed patient's understanding of A1c goal: <7% Provided education to patient about basic DM disease process Reviewed medications with patient and  discussed importance of medication adherence Counseled on importance of regular laboratory monitoring as prescribed Discussed plans with patient for ongoing care management follow up and provided patient with direct contact information for care management team Reviewed scheduled/upcoming provider appointments including: 06/19/22 with PCP, 07/03/22 with GI and 07/10/22 with PCP Advised patient, providing education and rationale, to check cbg 4 times a day and record, calling provider for findings outside established parameters Collaborate with PCP regarding insurance not covering CGM sensors Lab Results  Component Value Date   HGBA1C 7.8 (A) 05/29/2022   Stroke:  (Status:Goal on track:  Yes.) Long Term Goal Reviewed Importance of taking all medications as prescribed Reviewed Importance of attending all scheduled provider appointments Advised to report any changes in symptoms or exercise tolerance Assessed social determinant of health barriers Reviewed referrals to home health Reviewed referrals to outpatient therapy Reviewed the importance of exercise Assessed use of tobacco use Collaborate with PCP for recommended referrals during recent admission(rolling walker and HH PT)-Patient has not received walker or been scheduled for PT    Patient Goals/Self-Care Activities: Take medications as prescribed   Attend all scheduled provider appointments Call provider office for new concerns or questions

## 2022-06-19 NOTE — Patient Outreach (Signed)
Medicaid Managed Care   Nurse Care Manager Note  06/19/2022 Name:  Stephanie [name] MRN:  [id number] DOB:  1965/11/22  Stephanie [name] is an 56 y.o. year old female who is a primary patient of Stephanie Medici, DO.  The [facility] Managed Care Coordination team was consulted for assistance with:    Stroke DMII  Stephanie [name] was given information about Medicaid Managed Care Coordination team services today. Stephanie [name] Patient agreed to services and verbal consent obtained.  Engaged with patient by telephone for follow up visit in response to provider referral for case management and/or care coordination services.   Assessments/Interventions:  Review of past medical history, allergies, medications, health status, including review of consultants reports, laboratory and other test data, was performed as part of comprehensive evaluation and provision of chronic care management services.  SDOH (Social Determinants of Health) assessments and interventions performed: SDOH Interventions    Flowsheet Row Patient Outreach Telephone from 05/08/2022 in [place] Coordination Office Visit from 03/29/2022 in [facility]  SDOH Interventions    Food Insecurity Interventions Intervention Not Indicated --  Housing Interventions Intervention Not Indicated --  Transportation Interventions Other (Comment)  [Provided with [employer] transportation] --  Depression Interventions/Treatment  -- Medication       Care Plan  Allergies  Allergen Reactions   Gabapentin Itching   Ibuprofen Itching   Sulfa Antibiotics Hives   Tylenol [Acetaminophen] Itching   Aspirin Itching   Buprenorphine Hcl Itching   Carbamazepine Itching, Other (See Comments) and Rash    Makes her feel like something is crawling under her skin.   Codeine Itching and Other (See Comments)    Makes her feel like something is crawling under her skin. Can take, sometimes makes her itch    Compazine Itching and Other (See Comments)    "makes my skin crawl"   Elemental Sulfur Hives, Rash and Other (See Comments)    Skin Rashes, Hives   Ioxaglate Itching   Ivp Dye [Iodinated Contrast Media] Itching and Other (See Comments)    Makes her itch really bad. Had to get 2-3 shots of Benadryl when she was in the hospital.   Metrizamide Itching   Naproxen Itching   Norco [Hydrocodone-Acetaminophen] Itching   Other Itching and Other (See Comments)    Makes her itch really bad. Had to get 2-3 shots of Benadryl when she was in the hospital.   Penicillin G Hives, Rash and Other (See Comments)    Has patient had a PCN reaction causing immediate rash, facial/tongue/throat swelling, SOB or lightheadedness with hypotension: Yes Has patient had a PCN reaction causing severe rash involving mucus membranes or skin necrosis: No Has patient had a PCN reaction that required hospitalization: No Has patient had a PCN reaction occurring within the last 10 years: No If all of the above answers are "NO", then may proceed with Cephalosporin use.    Prochlorperazine Maleate Other (See Comments)    Compazine makes her skin crawl - needs 2-3 shots of Benadryl to get relief.   Reglan [Metoclopramide] Itching and Other (See Comments)    Makes her skin crawl   Toradol [Ketorolac Tromethamine] Itching   Tramadol Itching   Zofran [Ondansetron Hcl] Itching    Medications Reviewed Today     Reviewed by Stephanie Montane, RN (Registered Nurse) on 06/18/22 at 1549  Med List Status: <None>   Medication Order Taking? Sig Documenting Provider Last Dose Status Informant  albuterol (VENTOLIN  HFA) 108 (90 Base) MCG/ACT inhaler [id number] Yes Inhale 2 puffs into the lungs every 6 (six) hours as needed for wheezing or shortness of breath. Caro Laroche, DO Taking Active Self, Multiple Informants  ARIPiprazole (ABILIFY) 15 MG tablet [id number] Yes Take 15 mg by mouth every morning. [provider] Taking  Active Self, Multiple Informants  BREZTRI AEROSPHERE 160-9-4.8 MCG/ACT AERO [id number] Yes Inhale 2 puffs into the lungs 2 (two) times daily. Margarita Mail, DO Taking Active   CAPLYTA 42 MG capsule [id number] No Take 42 mg by mouth every evening.  Patient not taking: Reported on 06/12/2022   [provider] Not Taking Active Self, Multiple Informants  clopidogrel (PLAVIX) 75 MG tablet [id number] Yes Take 75 mg by mouth daily. [provider] Taking Active Self, Multiple Informants  Continuous Blood Gluc Sensor (FREESTYLE LIBRE 2 SENSOR) MISC [id number] Yes USE WITH READER TO CHECK SUGARS DAILY [provider] Taking Active Self, Multiple Informants  diazepam (VALIUM) 10 MG tablet [id number] Yes Take 10 mg by mouth 3 (three) times daily. [provider] Taking Active Self, Multiple Informants           Med Note (Stephanie [name] A   Tue Jun 18, 2022  3:48 PM) Taking two tablets at night  furosemide (LASIX) 40 MG tablet [id number] No Take 1 tablet (40 mg total) by mouth 2 (two) times daily as needed for edema (take 40 mg in the morning, can take additional 40 mg in afternoon if needed for edema).  Patient not taking: Reported on 06/18/2022   Berniece Salines, FNP Not Taking Active   lamoTRIgine (LAMICTAL) 100 MG tablet [id number] No Take 100 mg by mouth 2 (two) times daily.  Patient not taking: Reported on 06/18/2022   [provider] Not Taking Active   metFORMIN (GLUCOPHAGE-XR) 500 MG 24 hr tablet [id number] Yes Take 1 tablet (500 mg total) by mouth 2 (two) times daily with a meal. Margarita Mail, DO Taking Active   nitrofurantoin, macrocrystal-monohydrate, (MACROBID) 100 MG capsule [id number] No Take 1 capsule (100 mg total) by mouth every 12 (twelve) hours.  Patient not taking: Reported on 06/18/2022   Harle Battiest, PA-C Not Taking Active   omeprazole (PRILOSEC) 40 MG capsule [id number] Yes Take 1 capsule (40 mg total) by mouth daily. Margarita Mail, DO Taking Active   prazosin (MINIPRESS) 2 MG capsule [id number] Yes Take 4 mg by mouth every evening. [provider] Taking Active Self, Multiple Informants  traZODone (DESYREL) 150 MG tablet [id number] Yes Take by mouth at bedtime. [provider] Taking Active   Vilazodone HCl (VIIBRYD) 40 MG TABS [id number] Yes Take 40 mg by mouth daily. 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   Bipolar disorder (HCC) 05/09/2016   Hyperlipidemia, unspecified 05/09/2016   Irritable bowel syndrome 05/09/2016   Lumbago with sciatica 05/09/2016  Esophageal reflux 05/09/2016   Scoliosis 05/09/2016   Diverticulosis of colon 05/09/2016   DDD (degenerative disc disease), cervical 05/09/2016   ICH (intracerebral hemorrhage) (HCC)    Essential hypertension, malignant 04/19/2016   Cytotoxic brain edema (HCC) 04/19/2016   IVH (intraventricular hemorrhage) (HCC) 04/18/2016   Bilateral carpal tunnel syndrome 04/12/2016   Falls frequently 01/12/2016   Trigeminal neuralgia 06/08/2015   Sacroiliac dysfunction 03/04/2014   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 or radiculopathy 05/20/2013   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   Tobacco use disorder 09/11/2012   History of cervical cancer 07/30/2012   Hepatitis C     Conditions to be addressed/monitored  per PCP order:  DMII and Stroke  Care Plan : RN Care Manager Plan of Care  Updates made by Heidi Dach, RN since 06/19/2022 12:00 AM     Problem: Development of Plan of Care to address Health Management needs related to Stroke history      Long-Range Goal: Development of Plan of Care to address Health Management needs related to Stroke history   Start Date: 05/08/2022  Expected End Date: 08/06/2022  Priority: High  Note:   Current Barriers:  Chronic Disease Management support and education needs related to Improving Health with history of Stroke Patient having difficulty affording her medications and sensors. Freestyle Libre 14 day sensors, Breztri and Rebelsus are not covered by [employer]. Patient has a PCP appointment on 06/19/22. She has been unable to manage her BS which have been as high as 300. She has not heard from home health regarding PT.  RNCM Clinical Goal(s):  Patient will verbalize understanding of plan for management of Improving Health with history of stroke as evidenced by patient reports attend all scheduled medical appointments: 06/19/22 with PCP, 07/03/22 with GI and 07/10/22 with PCP as evidenced by provider documentation in EMR        continue to work with RN Care Manager and/or Social Worker to address care management and care coordination needs related to improving health with history of stroke as evidenced by adherence to CM Team Scheduled appointments     through collaboration with Medical illustrator, provider, and care team.   Interventions: Inter-disciplinary care team collaboration (see longitudinal plan of care) Evaluation of current treatment plan related to  self management and patient's adherence to plan as established by provider Collaborate with PCP regarding patient needing medications covered by Medicaid   Diabetes Interventions:  (Status:  New goal.) Long Term Goal Assessed patient's understanding of A1c goal: <7% Provided education to patient about  basic DM disease process Reviewed medications with patient and discussed importance of medication adherence Counseled on importance of regular laboratory monitoring as prescribed Discussed plans with patient for ongoing care management follow up and provided patient with direct contact information for care management team Reviewed scheduled/upcoming provider appointments including: 06/19/22 with PCP, 07/03/22 with GI and 07/10/22 with PCP Advised patient, providing education and rationale, to check cbg 4 times a day and record, calling provider for findings outside established parameters Collaborate with PCP regarding insurance not covering CGM sensors Lab Results  Component Value Date   HGBA1C 7.8 (A) 05/29/2022   Stroke:  (Status:Goal on track:  Yes.) Long Term Goal Reviewed Importance of taking all medications as prescribed Reviewed Importance of attending all scheduled provider appointments Advised to report any changes in symptoms or exercise tolerance Assessed social determinant of health barriers Reviewed referrals to home health Reviewed referrals to outpatient therapy Reviewed the importance of exercise  Assessed use of tobacco use Collaborate with PCP for recommended referrals during recent admission(rolling walker and HH PT)-Patient has not received walker or been scheduled for PT    Patient Goals/Self-Care Activities: Take medications as prescribed   Attend all scheduled provider appointments Call provider office for new concerns or questions        Follow Up:  Patient agrees to Care Plan and Follow-up.  Plan: The Managed Medicaid care management team will reach out to the patient again over the next 30 days.  Date/time of next scheduled RN care management/care coordination outreach:  07/23/22 @ 3:30pm  Estanislado Emms RN, BSN [place]  [facility] RN Care Coordinator

## 2022-06-20 ENCOUNTER — Telehealth: Payer: Self-pay | Admitting: Internal Medicine

## 2022-06-20 NOTE — Telephone Encounter (Signed)
Copied from [place] [id number]. Topic: Referral - Request for Referral >> Jun 20, 2022  1:32 PM Ludger Nutting wrote: Has patient seen PCP for this complaint? No. *If NO, is insurance requiring patient see PCP for this issue before PCP can refer them? Referral for which specialty: Cardiology Preferred provider/office:  Reason for referral:

## 2022-06-24 ENCOUNTER — Ambulatory Visit: Payer: Self-pay

## 2022-06-24 ENCOUNTER — Telehealth: Payer: Self-pay

## 2022-06-24 NOTE — Chronic Care Management (AMB) (Signed)
   Care Guide Note  06/24/2022 Name: LIVIYA SANTINI MRN: [id number] DOB: [birth year]  Referred by: Teodora Medici, DO Reason for referral : Care Coordination (Outreach to schedule Referral with Pharm D Catie )   ZOLA RUNION is a 56 y.o. year old female who is a primary care patient of Teodora Medici, DO. JEZLYN WESTERFIELD was referred to the pharmacist for assistance related to DM.    An unsuccessful telephone outreach was attempted today to contact the patient who was referred to the pharmacy team for assistance with medication management. Additional attempts will be made to contact the patient.   Noreene Larsson, [place], [place] [address] Direct Dial: [phone] [email or web address]

## 2022-06-24 NOTE — Telephone Encounter (Signed)
  Chief Complaint: HTN Symptoms: BP 159/99, HR 60s, dizziness, HA Frequency: today Pertinent Negatives: NA Disposition: [] ED /[] Urgent Care (no appt availability in office) / [x] Appointment(In office/virtual)/ []  [facility]/ [] Home Care/ [] Refused Recommended Disposition /[] [facility]/ []  Follow-up with PCP Additional Notes: pt states she doesn't take anything for HTN but BP elevated and HR been lower than normal. HR normally in 80s. Scheduled pt appt for Tues but pt needs to switch to Wednesday. Care advice given and pt verbalized understanding. No other questions/concerns noted.    Summary: Blood pressure/dizziness   Patient states that her blood pressure is a little high today. Her last reading was 159/99. Patient also states she is dizzy when she stands up.      Reason for Disposition  Systolic BP  >= 888 OR Diastolic >= 280  Answer Assessment - Initial Assessment Questions 1. BLOOD PRESSURE: "What is the blood pressure?" "Did you take at least two measurements 5 minutes apart?"     159/99 2. ONSET: "When did you take your blood pressure?"     today 4. HISTORY: "Do you have a history of high blood pressure?"     HTN 5. MEDICINES: "Are you taking any medicines for blood pressure?" "Have you missed any doses recently?"     no 6. OTHER SYMPTOMS: "Do you have any symptoms?" (e.g., blurred vision, chest pain, difficulty breathing, headache, weakness)     dizziness  Protocols used: Blood Pressure - High-A-AH

## 2022-06-25 ENCOUNTER — Ambulatory Visit: Payer: Medicaid Other | Admitting: Internal Medicine

## 2022-06-26 ENCOUNTER — Ambulatory Visit: Payer: Medicaid Other | Admitting: Nurse Practitioner

## 2022-06-26 ENCOUNTER — Ambulatory Visit: Payer: Medicaid Other | Admitting: Internal Medicine

## 2022-06-26 ENCOUNTER — Encounter: Payer: Self-pay | Admitting: Nurse Practitioner

## 2022-06-26 VITALS — BP 146/82 | HR 96 | Resp 18 | Ht 62.0 in | Wt 204.8 lb

## 2022-06-26 DIAGNOSIS — I10 Essential (primary) hypertension: Secondary | ICD-10-CM | POA: Diagnosis not present

## 2022-06-26 DIAGNOSIS — E1165 Type 2 diabetes mellitus with hyperglycemia: Secondary | ICD-10-CM | POA: Diagnosis not present

## 2022-06-26 DIAGNOSIS — E876 Hypokalemia: Secondary | ICD-10-CM

## 2022-06-26 DIAGNOSIS — R42 Dizziness and giddiness: Secondary | ICD-10-CM | POA: Diagnosis not present

## 2022-06-26 MED ORDER — PIOGLITAZONE HCL 15 MG PO TABS: 15.0000 mg | ORAL_TABLET | Freq: Every day | ORAL | 0 refills | Status: DC

## 2022-06-26 MED ORDER — POTASSIUM CHLORIDE CRYS ER 20 MEQ PO TBCR: EXTENDED_RELEASE_TABLET | ORAL | 3 refills | Status: DC

## 2022-06-26 NOTE — Assessment & Plan Note (Addendum)
Patient is not currently on blood pressure medication other than Lasix that she was taking for swelling.  She was recently told to only take it as needed due to her low blood pressure readings.  Patient's blood pressure today is 150/80 recheck was 146/82.  Discussed with patient she can take Lasix 40 mg once a day keep a blood pressure log and bring to her next appointment. Also take potassium tablet two times a day for three days then daily with lasix.

## 2022-06-26 NOTE — Progress Notes (Addendum)
BP (!) 146/82   Pulse 96   Resp 18   Ht 5\' 2"  (1.575 m)   Wt 204 lb 12.8 oz (92.9 kg)   SpO2 92%   BMI 37.46 kg/m    Subjective:    [name]: , female    DOB: 1966-07-18, 56 y.o.   MRN: [id number]  HPI: Stephanie [name] is a 56 y.o. female  Chief Complaint  Patient presents with   Dizziness   Dizziness:  Patient has been seen multiple times for dizziness.  She reports diarrhea has resolved.  Patient denies any recent falls since last visit.  She states she notices it the dizziness more when she changes position.  Discussed moving slowly to prevent falls.  Referral was placed for physical therapy at a previous appointment.  She says she has not started physical therapy. She says that she has an appointment with neurology coming up. Discussed with patient to make sure she tells neurologist that she is having continued dizziness.    Diabetes: She is currently taking metformin 500 mg BID. She says her blood sugars have been running 210-300.  She says she is checking her blood sugar prior to eating. Her last A1C was 7.8 on 05/29/2022. She would like try monjaro.  Discussed with patient that insurance did not cover rybelsus and would likely not cover mounjaro.  Patient will try actos 15 mg daily.   HTN/edema:  Her blood pressure today is 150/80, recheck was 146/82.  At last office visit with Dr. 05/31/2022 patient's blood pressure was 120/64.  06/17/2022 - 117/90 06/12/2022- 102/78 She says her blood pressure has been running elevated at home around 180-190/70. She also reports she has been having some edema. Minimal edema noted on exam. She was on lasix 40 mg BID, instructions were changed to taking it only PRN for swelling at a previous office visit. Patient says she has not been taking her lasix at all.  Discussed with patient to take lasix once a day.  Also take potassium 20 mEq BID for three days and then once a day with lasix.   Relevant past medical, surgical, family  and social history reviewed and updated as indicated. Interim medical history since our last visit reviewed. Allergies and medications reviewed and updated.  Review of Systems  Constitutional: Negative for fever or weight change.  Respiratory: Negative for cough and shortness of breath.   Cardiovascular: Negative for chest pain or palpitations.  Gastrointestinal: Negative for abdominal pain, no bowel changes.  Musculoskeletal: Negative for gait problem or joint swelling.  Skin: Negative for rash.  Neurological: Positive for dizziness or negative headache.  No other specific complaints in a complete review of systems (except as listed in HPI above).      Objective:    BP (!) 146/82   Pulse 96   Resp 18   Ht 5\' 2"  (1.575 m)   Wt 204 lb 12.8 oz (92.9 kg)   SpO2 92%   BMI 37.46 kg/m   Wt Readings from Last 3 Encounters:  06/26/22 204 lb 12.8 oz (92.9 kg)  06/19/22 195 lb 4.8 oz (88.6 kg)  06/12/22 192 lb 11.2 oz (87.4 kg)    Physical Exam  Constitutional: Patient appears well-developed and well-nourished. Obese  No distress.  HEENT: head atraumatic, normocephalic, pupils equal and reactive to light, ears Tms clear, neck supple, throat within normal limits Cardiovascular: Normal rate, regular rhythm and normal heart sounds.  No murmur heard. mild BLE edema. Pulmonary/Chest:  Effort normal and breath sounds normal. No respiratory distress. Abdominal: Soft.  There is no tenderness. Psychiatric: Patient has a normal mood and affect. behavior is normal. Judgment and thought content normal.  Results for orders placed or performed in visit on 06/05/22  CULTURE, URINE COMPREHENSIVE   Specimen: Urine   UR  Result Value Ref Range   Urine Culture, Comprehensive Final report    Organism ID, Bacteria Comment   Microscopic Examination   Urine  Result Value Ref Range   WBC, UA >30 (A) 0 - 5 /hpf   RBC, Urine 0-2 0 - 2 /hpf   Epithelial Cells (non renal) 0-10 0 - 10 /hpf   Mucus, UA  Present (A) Not Estab.   Bacteria, UA Moderate (A) None seen/Few   Yeast, UA Present (A) None seen  Urinalysis, Complete  Result Value Ref Range   Specific Gravity, UA 1.010 1.005 - 1.030   pH, UA 7.0 5.0 - 7.5   Color, UA Yellow Yellow   Appearance Ur Hazy (A) Clear   Leukocytes,UA 3+ (A) Negative   Protein,UA Negative Negative/Trace   Glucose, UA Negative Negative   Ketones, UA Negative Negative   RBC, UA Trace (A) Negative   Bilirubin, UA Negative Negative   Urobilinogen, Ur 0.2 0.2 - 1.0 mg/dL   Nitrite, UA Negative Negative   Microscopic Examination See below:       Assessment & Plan:   Problem List Items Addressed This Visit       Cardiovascular and Mediastinum   Essential hypertension, malignant    Patient is not currently on blood pressure medication other than Lasix that she was taking for swelling.  She was recently told to only take it as needed due to her low blood pressure readings.  Patient's blood pressure today is 150/80 recheck was 146/82.  Discussed with patient she can take Lasix 40 mg once a day keep a blood pressure log and bring to her next appointment. Also take potassium tablet two times a day for three days then daily with lasix.        Endocrine   Type 2 diabetes mellitus ([place]) - Primary    Continue taking metformin 500 mg twice daily.  We will add on Actos 15 mg daily.  Continue to monitor blood sugar and bring log to next appointment.      Relevant Medications   pioglitazone (ACTOS) 15 MG tablet     Other   Hypokalemia   Relevant Medications   potassium chloride SA (KLOR-CON M) 20 MEQ tablet   Other Visit Diagnoses     Dizziness       Call physical therapy.  At follow-up appointment with neurology please notify them of your continued dizziness.        Follow up plan: Return for has appointment schedule with Rosana Berger on 11/1, follow up.

## 2022-06-26 NOTE — Assessment & Plan Note (Signed)
Continue taking metformin 500 mg twice daily.  We will add on Actos 15 mg daily.  Continue to monitor blood sugar and bring log to next appointment.

## 2022-06-26 NOTE — Progress Notes (Deleted)
Established Patient Office Visit  Subjective   [name]: Stephanie [name], female    DOB: 1966/03/26  Age: 56 y.o. MRN: [id number]  No chief complaint on file.   HPI Patient is here for concerns about blood pressure.  She is here with her step-son.   Multiple falls: -History of multiple falls, last of which was last week.  She has not had any falls since our last visit.  She denies feeling dizzy or lightheaded or feeling like she is in a pass out.  Feels like her legs will give out on her. -She had fallen previously and stated that it is because she is on 4 different blood pressure medicines, the patient brought all of her medications in with her today and other than Lasix as needed for swelling which she is not currently taking there are no blood pressure medications. -She does have history of hemorrhagic stroke from 2017, which greatly affected her balance and coordination.  Patient states she is not very active and sits in her recliner all day at home. -Referral to physical therapy placed at last office visit, currently coordinating  Diabetes, Type 2: -Last A1c 9/23 7.8% -Medications: Metformin 1000 mg BID switched to ER version last week due to diarrhea, added Rybelsus 3 mg at LOV but the patient cannot afford this medication (had been on Glipizide in the past) -The patient has not picked up her new prescription for the metformin extended release, I reiterated to her today that her diarrhea is a side effect of the metformin she needs to pick up new prescription to help with this side effect -Checking BG at home: 214  fasting this morning  -Diet: eats sausage sandwich on wheat bread, no lunch  -Eye exam: UTD 8/23 -Foot exam: UTD 9/23 -Microalbumin: UTD 6/23 -Statin: yes -PNA vaccine: UTD -Denies symptoms of hypoglycemia, polyuria, polydipsia, numbness extremities, foot ulcers/trauma.   HLD/Hx of CVA - left posterior temporal hemorrhage in 04/2016 and old posterior left temporal  lobe/infarct and parietal lobe infarcts/Seizure Disorder: -CVA August 2017, follows with Neurology -Medications: Crestor 20 mg, Plavix 75 mg -Was also told that she had a seizure prior to her stroke in 2017 and is currently on Lamictal 100 mg (had been on Keppra 250 mg BID as well but Neurology confirmed she is not to be taking this). -Patient is compliant with above medications and reports no side effects.  -Last lipid panel: Lipid Panel     Component Value Date/Time   CHOL 123 02/13/2022 1211   TRIG 69 02/13/2022 1211   HDL 73 02/13/2022 1211   CHOLHDL 1.7 02/13/2022 1211   VLDL 18 04/22/2016 0357   LDLCALC 35 02/13/2022 1211   COPD: -COPD status: stable -Current medications: Breztri 160, Albuterol PRN -Satisfied with current treatment?: yes -Oxygen use: no -Dyspnea frequency: occasional  -Cough frequency: no -Rescue inhaler frequency:  3 times daily right now but she is out of her Breztri  -Limitation of activity: yes -Productive cough: no -Pneumovax: Not up to Date -Influenza: Not up to Date -Former smoker; quit smoking 6 months ago   Bilateral Nephrolithiasis: -Chronic, following with Urology  GERD: -Currently on Prilosec 40 mg daily   Bipolar/Depression: -Follows with Psychiatry at [facility]  -Currently on Abilify 15 mg, Caplyta 42 mg, Prazosin 4 mg at night, Viibryd 40 mg, Trazodone 150 mg and Valium 10 mg taking 2 pills at night   Patient Active Problem List   Diagnosis Date Noted   Hypokalemia 04/17/2022  Leg swelling 02/13/2022   Wheezing 02/13/2022   Migraine 05/12/2020   Type 2 diabetes mellitus (HCC) 08/22/2018   Other specified cardiac dysrhythmias 08/22/2018   Acute encephalopathy 08/21/2018   Kidney stone 03/10/2018   UTI (urinary tract infection) 08/21/2017   History of CVA (cerebrovascular accident) 01/28/2017   History of migraine 01/28/2017   Small bowel obstruction (HCC) 09/02/2016   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   Bipolar disorder  (HCC) 05/09/2016   Hyperlipidemia, unspecified 05/09/2016   Irritable bowel syndrome 05/09/2016   Lumbago with sciatica 05/09/2016   Esophageal reflux 05/09/2016   Scoliosis 05/09/2016   Diverticulosis of colon 05/09/2016   DDD (degenerative disc disease), cervical 05/09/2016   ICH (intracerebral hemorrhage) (HCC)    Essential hypertension, malignant 04/19/2016   Cytotoxic brain edema (HCC) 04/19/2016   IVH (intraventricular hemorrhage) (HCC) 04/18/2016   Bilateral carpal tunnel syndrome 04/12/2016   Falls frequently 01/12/2016   Trigeminal neuralgia 06/08/2015   Sacroiliac dysfunction 03/04/2014   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 or radiculopathy 05/20/2013   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   Tobacco use disorder 09/11/2012   History of cervical cancer 07/30/2012   Hepatitis C    Past Medical History:  Diagnosis Date   Closed fracture of shaft of humerus 01/20/2017   Diabetes mellitus without complication (HCC)    Difficult intubation    Diverticulitis    Headache    Hepatitis C    treated and resolved   History of kidney stones    Hypertension    Hypotension 08/22/2018   IBS (irritable bowel syndrome)    Kidney stones    Sciatica    Sleep apnea    on cpap   Stroke (HCC) 2017   memory loss   Superficial venous thrombosis of arm, left 07/22/2016   Past Surgical History:  Procedure Laterality Date   ABDOMINAL HYSTERECTOMY  2007   carpel tunnel syndrome  2017   HARDWARE REMOVAL Right 12/22/2017   Procedure: HARDWARE REMOVAL-RIGHT ARM;  Surgeon: Lyndle Herrlich, MD;  Location: [facility];  Service: Orthopedics;  Laterality: Right;  Removal of implant, superficial right humerus   HUMERUS IM NAIL Right 03/11/2017   Procedure: INTRAMEDULLARY (IM) NAIL HUMERAL;  Surgeon: Lyndle Herrlich, MD;  Location: [facility];  Service: Orthopedics;  Laterality: Right;   KIDNEY STONE SURGERY Right 02/12/2012   KIDNEY STONE SURGERY Left 07/15/2012    LIGATION OF ARTERIOVENOUS  FISTULA Left 06/21/2016   Procedure: LIGATION OF ARTERIOVENOUS  FISTULA ( LIGATION BASILIC VEIN );  Surgeon: Renford Dills, MD;  Location: [facility];  Service: Vascular;  Laterality: Left;   LITHOTRIPSY     OOPHORECTOMY     SINUS EXPLORATION     TONSILLECTOMY     Social History   Tobacco Use   Smoking status: Every Day    Packs/day: 1.00    Years: 1.50    Total pack years: 1.50    Types: Cigarettes   Smokeless tobacco: Never  Vaping Use   Vaping Use: Never used  Substance Use Topics   Alcohol use: Not Currently    Comment: no alcohol since 2002   Drug use: No   Social History   Socioeconomic History   Marital status: Legally Separated    Spouse name: Not on file   Number of children: Not on file   Years of education: Not on file   Highest education level: Not on file  Occupational History   Not on file  Tobacco Use  Smoking status: Every Day    Packs/day: 1.00    Years: 1.50    Total pack years: 1.50    Types: Cigarettes   Smokeless tobacco: Never  Vaping Use   Vaping Use: Never used  Substance and Sexual Activity   Alcohol use: Not Currently    Comment: no alcohol since 2002   Drug use: No   Sexual activity: Not on file  Other Topics Concern   Not on file  Social History Narrative   Not on file   Social Determinants of Health   Financial Resource Strain: Not on file  Food Insecurity: No Food Insecurity (05/08/2022)   Hunger Vital Sign    Worried About Running Out of Food in the Last Year: Never true    Ran Out of Food in the Last Year: Never true  Transportation Needs: Unmet Transportation Needs (05/08/2022)   PRAPARE - Administrator, Civil Service (Medical): Yes    Lack of Transportation (Non-Medical): No  Physical Activity: Not on file  Stress: Not on file  Social Connections: Not on file  Intimate Partner Violence: Not on file   Family Status  Relation Name Status   Mother  Deceased   Father  Alive    Sister  Deceased   Family History  Problem Relation Age of Onset   Heart failure Mother    Diabetes Father    Cancer Sister    Allergies  Allergen Reactions   Gabapentin Itching   Ibuprofen Itching   Sulfa Antibiotics Hives   Tylenol [Acetaminophen] Itching   Aspirin Itching   Buprenorphine Hcl Itching   Carbamazepine Itching, Other (See Comments) and Rash    Makes her feel like something is crawling under her skin.   Codeine Itching and Other (See Comments)    Makes her feel like something is crawling under her skin. Can take, sometimes makes her itch   Compazine Itching and Other (See Comments)    "makes my skin crawl"   Elemental Sulfur Hives, Rash and Other (See Comments)    Skin Rashes, Hives   Ioxaglate Itching   Ivp Dye [Iodinated Contrast Media] Itching and Other (See Comments)    Makes her itch really bad. Had to get 2-3 shots of Benadryl when she was in the hospital.   Metrizamide Itching   Naproxen Itching   Norco [Hydrocodone-Acetaminophen] Itching   Other Itching and Other (See Comments)    Makes her itch really bad. Had to get 2-3 shots of Benadryl when she was in the hospital.   Penicillin G Hives, Rash and Other (See Comments)    Has patient had a PCN reaction causing immediate rash, facial/tongue/throat swelling, SOB or lightheadedness with hypotension: Yes Has patient had a PCN reaction causing severe rash involving mucus membranes or skin necrosis: No Has patient had a PCN reaction that required hospitalization: No Has patient had a PCN reaction occurring within the last 10 years: No If all of the above answers are "NO", then may proceed with Cephalosporin use.    Prochlorperazine Maleate Other (See Comments)    Compazine makes her skin crawl - needs 2-3 shots of Benadryl to get relief.   Reglan [Metoclopramide] Itching and Other (See Comments)    Makes her skin crawl   Toradol [Ketorolac Tromethamine] Itching   Tramadol Itching   Zofran [Ondansetron  Hcl] Itching    Review of Systems  Constitutional:  Negative for chills and fever.  Respiratory:  Negative for cough, shortness of  breath and wheezing.   Cardiovascular:  Negative for chest pain.  Gastrointestinal:  Positive for diarrhea. Negative for abdominal pain, heartburn, nausea and vomiting.  Musculoskeletal:  Positive for falls.      Objective:     There were no vitals taken for this visit. BP Readings from Last 3 Encounters:  06/19/22 120/64  06/17/22 (!) 117/90  06/12/22 102/78   Wt Readings from Last 3 Encounters:  06/19/22 195 lb 4.8 oz (88.6 kg)  06/12/22 192 lb 11.2 oz (87.4 kg)  06/05/22 197 lb (89.4 kg)     Physical Exam Constitutional:      Appearance: Normal appearance.  HENT:     Head: Normocephalic and atraumatic.  Eyes:     Conjunctiva/sclera: Conjunctivae normal.  Cardiovascular:     Rate and Rhythm: Normal rate and regular rhythm.  Pulmonary:     Effort: Pulmonary effort is normal.     Breath sounds: Normal breath sounds.  Musculoskeletal:     Right lower leg: No edema.     Left lower leg: No edema.  Skin:    General: Skin is warm and dry.  Neurological:     Mental Status: She is alert. Mental status is at baseline.  Psychiatric:        Attention and Perception: Attention normal.        Mood and Affect: Affect is flat.        Speech: Speech is delayed.     No results found for any visits on 06/26/22.   Last CBC Lab Results  Component Value Date   WBC 6.1 05/31/2022   HGB 13.9 05/31/2022   HCT 41.7 05/31/2022   MCV 93.5 05/31/2022   MCH 31.2 05/31/2022   RDW 14.1 05/31/2022   PLT 203 05/31/2022   Last metabolic panel Lab Results  Component Value Date   GLUCOSE 153 (H) 05/31/2022   NA 141 05/31/2022   K 3.0 (L) 05/31/2022   CL 102 05/31/2022   CO2 31 05/31/2022   BUN 12 05/31/2022   CREATININE 0.75 05/31/2022   GFRNONAA >60 05/31/2022   CALCIUM 10.3 05/31/2022   PHOS 2.3 (L) 08/22/2018   PROT 7.4 04/16/2022    ALBUMIN 3.9 04/16/2022   BILITOT 0.5 04/16/2022   ALKPHOS 96 04/16/2022   AST 30 04/16/2022   ALT 34 04/16/2022   ANIONGAP 8 05/31/2022   Last lipids Lab Results  Component Value Date   CHOL 123 02/13/2022   HDL 73 02/13/2022   LDLCALC 35 02/13/2022   TRIG 69 02/13/2022   CHOLHDL 1.7 02/13/2022   Last hemoglobin A1c Lab Results  Component Value Date   HGBA1C 7.8 (A) 05/29/2022   Last thyroid functions Lab Results  Component Value Date   TSH 1.218 04/16/2022   Last vitamin D No results found for: "25OHVITD2", "25OHVITD3", "VD25OH" Last vitamin B12 and Folate Lab Results  Component Value Date   VITAMINB12 380 02/20/2022      The ASCVD Risk score (Arnett DK, et al., 2019) failed to calculate for the following reasons:   The patient has a prior MI or stroke diagnosis    Assessment & Plan:   1. Diarrhea, unspecified type: Patient is having diarrhea as a side effect from her metformin, I sent in a new prescription for metformin extended release to her pharmacy last week, which the patient did not pick up yet.  Discussed today that she needs to pick up that medication to start today to help with her diarrhea.  2.  Medication management: Continue to go through medications, she will restart her Plavix and discontinue the Keppra which was taken off her list.  She will only take her Lasix as needed for swelling which she currently does not have to avoid hypotension and dizziness.  Referral previously placed to pharmacy to review medications.  Follow-up already scheduled here for 11/1.   No follow-ups on file.    Teodora Medici, DO

## 2022-06-26 NOTE — Patient Instructions (Addendum)
Can start taking Lasix one time a day Continue to monitor blood pressure and bring log to next appointment Start actos 15 mg daily Continue taking metformin 500 mg two times a day Continue to monitor blood sugar and bring to next appointment

## 2022-06-26 NOTE — Addendum Note (Signed)
Addended by: Serafina Royals F on: 06/26/2022 03:12 PM   Modules accepted: Orders

## 2022-06-27 ENCOUNTER — Telehealth: Payer: Self-pay | Admitting: Internal Medicine

## 2022-06-27 ENCOUNTER — Other Ambulatory Visit: Payer: Self-pay | Admitting: Internal Medicine

## 2022-06-27 DIAGNOSIS — R197 Diarrhea, unspecified: Secondary | ICD-10-CM

## 2022-06-27 NOTE — Telephone Encounter (Signed)
Referral Request - Has patient seen PCP for this complaint? Yes.    Referral for which specialty: GI  Preferred provider/office: [facility]  Reason for referral: constant diarrhea

## 2022-06-27 NOTE — Telephone Encounter (Signed)
Left detailed vm °

## 2022-07-01 ENCOUNTER — Ambulatory Visit: Payer: Self-pay

## 2022-07-01 DIAGNOSIS — N200 Calculus of kidney: Secondary | ICD-10-CM | POA: Diagnosis not present

## 2022-07-01 DIAGNOSIS — Z7689 Persons encountering health services in other specified circumstances: Secondary | ICD-10-CM | POA: Diagnosis not present

## 2022-07-01 DIAGNOSIS — R109 Unspecified abdominal pain: Secondary | ICD-10-CM | POA: Diagnosis not present

## 2022-07-01 NOTE — Telephone Encounter (Signed)
  Chief Complaint: Diarrhea - 3 weeks Symptoms: 3 additional stools daily - pain 8/10 Frequency: 3 weeks Pertinent Negatives: Patient denies fever, blood in stool Disposition: [x] ED /[] Urgent Care (no appt availability in office) / [] Appointment(In office/virtual)/ []  [facility]/ [] Home Care/ [] Refused Recommended Disposition /[] [facility]/ []  Follow-up with PCP Additional Notes: PT states that pain is fairly often at 8/10. PT has had diarrhea for 3 weeks and sometimes cannot get to the bathroom I time. PT states that her stool looks grainy like sand.  Pt also reports falling yesterday in the bathroom.    Reason for Disposition  [1] SEVERE abdominal pain (e.g., excruciating) AND [2] present > 1 hour  Answer Assessment - Initial Assessment Questions 1. DIARRHEA SEVERITY: "How bad is the diarrhea?" "How many more stools have you had in the past 24 hours than normal?"    - NO DIARRHEA (SCALE 0)   - MILD (SCALE 1-3): Few loose or mushy BMs; increase of 1-3 stools over normal daily number of stools; mild increase in ostomy output.   -  MODERATE (SCALE 4-7): Increase of 4-6 stools daily over normal; moderate increase in ostomy output.   -  SEVERE (SCALE 8-10; OR "WORST POSSIBLE"): Increase of 7 or more stools daily over normal; moderate increase in ostomy output; incontinence.      3 additional 2. ONSET: "When did the diarrhea begin?"      3 weeks ago 3. BM CONSISTENCY: "How loose or watery is the diarrhea?"      Grainy - sand looking 4. VOMITING: "Are you also vomiting?" If Yes, ask: "How many times in the past 24 hours?"      no 5. ABDOMEN PAIN: "Are you having any abdomen pain?" If Yes, ask: "What does it feel like?" (e.g., crampy, dull, intermittent, constant)      sometimes 6. ABDOMEN PAIN SEVERITY: If present, ask: "How bad is the pain?"  (e.g., Scale 1-10; mild, moderate, or severe)   - MILD (1-3): doesn't interfere with normal activities, abdomen soft and not  tender to touch    - MODERATE (4-7): interferes with normal activities or awakens from sleep, abdomen tender to touch    - SEVERE (8-10): excruciating pain, doubled over, unable to do any normal activities       8/10 - Most of time 7. ORAL INTAKE: If vomiting, "Have you been able to drink liquids?" "How much liquids have you had in the past 24 hours?"     yes 8. HYDRATION: "Any signs of dehydration?" (e.g., dry mouth [not just dry lips], too weak to stand, dizziness, new weight loss) "When did you last urinate?"     Yes - fell last night 9. EXPOSURE: "Have you traveled to a foreign country recently?" "Have you been exposed to anyone with diarrhea?" "Could you have eaten any food that was spoiled?"      10. ANTIBIOTIC USE: "Are you taking antibiotics now or have you taken antibiotics in the past 2 months?"       1 months for UTI 11. OTHER SYMPTOMS: "Do you have any other symptoms?" (e.g., fever, blood in stool)       no 12. PREGNANCY: "Is there any chance you are pregnant?" "When was your last menstrual period?"       na  Protocols used: [facility]

## 2022-07-02 NOTE — Telephone Encounter (Signed)
Fyi does not look like went to ed

## 2022-07-02 NOTE — Telephone Encounter (Signed)
Or returned stool samples

## 2022-07-03 ENCOUNTER — Ambulatory Visit: Payer: Medicaid Other | Admitting: Gastroenterology

## 2022-07-08 ENCOUNTER — Other Ambulatory Visit: Payer: Medicaid Other | Admitting: Pharmacist

## 2022-07-08 NOTE — Progress Notes (Signed)
07/08/2022 Name: Stephanie [name] MRN: [id number] DOB: [birth year]  Chief Complaint  Patient presents with   Medication Management   Diabetes   Hypertension    Stephanie [name] is a 56 y.o. year old female who presented for a telephone visit.   They were referred to the pharmacist by their PCP for assistance in managing diabetes and complex medication management.   Patient is participating in a Managed Medicaid Plan:  Yes  Subjective:  Care Team: Primary Care Provider: Margarita Mail, DO ; Next Scheduled Visit: 07/10/22  Medication Access/Adherence  Current Pharmacy:  [facility] [address], [place] - [address] [place] [address] Phone: [phone] Fax: [phone]   Patient reports affordability concerns with their medications: No  Patient reports access/transportation concerns to their pharmacy: No  Patient reports adherence concerns with their medications:  No    Patient and Dawn both report they would like to transfer medications to a pharmacy that offers adherence packaging.   Diabetes:  Current medications: metformin XR 500 mg twice daily; pioglitazone 15 mg daily   Per fill history, appears [facility] last filled IR metformin. Patient has been reporting diarrhea. Denies any new swelling since starting pioglitizone a week ago  Current glucose readings: reports readings <120 fasting;  Patient denies hypoglycemic s/sx including dizziness, shakiness, sweating. Patient denies hyperglycemic symptoms including polyuria, polydipsia, polyphagia, nocturia, neuropathy, blurred vision.  Current meal patterns:  - Breakfast: keto yogurt + banana - Lunch: microwave lean cuisine; tries to avoid spaghetti sauce;  - Supper: weight watchers dinner; ravioli with cottage cheese, alfredo sauce - reports packages say no sodium, no sugar - Snacks: reports none except fruits - Drinks: infrequent sprite zero over several days, mostly water    Physical Activity: reports she has an exercise bike and does 20 minutes twice daily   Hypertension:  Current medications: none; furosemide 40 mg twice daily per [facility]   Patient has a validated, automated, upper arm home BP cuff Current blood pressure readings readings: 117/70-80  Patient denies hypotensive s/sx including dizziness, lightheadedness. Reports sudden improvement in this.   Current meal patterns: denies using salt in foods  Hyperlipidemia/ASCVD Risk Reduction  Current lipid lowering medications: none; previously on rosuvastatin 20 mg but patient has not filled since May  Antiplatelet regimen: clopidogrel 75 mg daily  COPD:  Current medications: prescribed Breztri, but Medicaid does not cover; reports she has not needed albuterol HFA lately  Bipolar Disorder  Current medications: aripiprazole 15 mg daily, viladozone 40 mg daily, Caplyta 42 mg daily, prazosin 4 mg QPM, trazodone 150 mg QPM; diazepam 10 mg - takes 1 every evening, if she can't sleep after an hour, she takes a second dose.   Contacted [facility] to confirm intended regimen. She is taking the prescribed regimen per the last visit note from Zorita Pang. Next appointment is 11/15   Health Maintenance  Health Maintenance Due  Topic Date Due   OPHTHALMOLOGY EXAM  Never done   Zoster Vaccines- Shingrix (1 of 2) Never done   COVID-19 Vaccine (2 - Janssen risk series) 08/25/2020     Objective: Lab Results  Component Value Date   HGBA1C 7.8 (A) 05/29/2022    Lab Results  Component Value Date   CREATININE 0.75 05/31/2022   BUN 12 05/31/2022   NA 141 05/31/2022   K 3.0 (L) 05/31/2022   CL 102 05/31/2022   CO2 31 05/31/2022    Lab Results  Component Value Date  CHOL 123 02/13/2022   HDL 73 02/13/2022   LDLCALC 35 02/13/2022   TRIG 69 02/13/2022   CHOLHDL 1.7 02/13/2022    Medications Reviewed Today     Reviewed by Osker Mason, RPH-CPP (Pharmacist) on 07/08/22 at 1354   Med List Status: <None>   Medication Order Taking? Sig Documenting Provider Last Dose Status Informant  albuterol (VENTOLIN HFA) 108 (90 Base) MCG/ACT inhaler [id number] No Inhale 2 puffs into the lungs every 6 (six) hours as needed for wheezing or shortness of breath.  Patient not taking: Reported on 07/08/2022   Myles Gip, DO Not Taking Active Self, Multiple Informants  ARIPiprazole (ABILIFY) 15 MG tablet [id number] Yes Take 15 mg by mouth every morning. [provider] Taking Active Self, Multiple Informants  BREZTRI AEROSPHERE 160-9-4.8 MCG/ACT AERO [id number] Yes Inhale 2 puffs into the lungs 2 (two) times daily. Teodora Medici, DO Taking Active   CAPLYTA 42 MG capsule [id number]  Take 42 mg by mouth every evening. [provider]  Active Self, Multiple Informants  clopidogrel (PLAVIX) 75 MG tablet [id number] Yes Take 75 mg by mouth daily. [provider] Taking Active Self, Multiple Informants  diazepam (VALIUM) 10 MG tablet [id number] Yes Take 10 mg by mouth 3 (three) times daily. [provider] Taking Active Self, Multiple Informants           Med Note Jodi Mourning, Toney Reil Jul 08, 2022 10:32 AM) Takes 1-2 QPM  furosemide (LASIX) 40 MG tablet [id number] Yes Take 1 tablet (40 mg total) by mouth 2 (two) times daily as needed for edema (take 40 mg in the morning, can take additional 40 mg in afternoon if needed for edema). Bo Merino, FNP Taking Active            Med Note Jodi Mourning, Toney Reil Jul 08, 2022 10:49 AM) Taking 40 mg twice daily  lamoTRIgine (LAMICTAL) 100 MG tablet [id number] Yes Take 100 mg by mouth 2 (two) times daily. [provider] Taking Active   metFORMIN (GLUCOPHAGE-XR) 500 MG 24 hr tablet [id number] Yes Take 1 tablet (500 mg total) by mouth 2 (two) times daily with a meal. Teodora Medici, DO Taking Active   omeprazole (PRILOSEC) 40 MG capsule [id number] No Take 1 capsule (40 mg total) by mouth daily.   Patient not taking: Reported on 07/08/2022   Teodora Medici, DO Not Taking Active   pioglitazone (ACTOS) 15 MG tablet [id number] Yes Take 1 tablet (15 mg total) by mouth daily. Bo Merino, FNP Taking Active   potassium chloride SA (KLOR-CON M) 20 MEQ tablet [id number] Yes Take 20 mEq two times a day for three days, then once a day with lasix Bo Merino, FNP Taking Active            Med Note Jodi Mourning, Toney Reil Jul 08, 2022 10:50 AM) Taking twice daily  prazosin (MINIPRESS) 2 MG capsule [id number] Yes Take 4 mg by mouth every evening. [provider] Taking Active Self, Multiple Informants  traZODone (DESYREL) 150 MG tablet [id number]  Take by mouth at bedtime. [provider]  Active   Vilazodone HCl (VIIBRYD) 40 MG TABS [id number] Yes Take 40 mg by mouth daily. [provider] Taking Active   Med List Note Brunilda Payor, CPhT 04/17/22 1014): Augustina Mood Friend   [phone]               Assessment/Plan:   Medication Access/Adherence: - [place]  to patient that offers free delivery and adherence packaging is [facility]. Patient amenable. Contacted the pharmacy. They ask that providers send new prescription. Will collaborate with PCP to send refills on chronic medications to [facility].  Diabetes: - Blood sugar readings improved. Denies increased swelling with pioglitazone - Contacted [facility], asked they cancel the old prescription for metformin IR.  - Continue current regimen at this time  Hypertension: - Per patient, controlled at home. She reports she previously took her blood pressure cuff to the provider's office for comparison.  - Will notify PCP that she has been taking furosemide twice daily. Recommend updated script to [facility].   Hyperlipidemia/ASCVD Risk Reduction - Uncontrolled due to medication nonadherence - Recommend to send refill on rosuvastatin to Pharmacy.  Will collaborate with PCP.   COPD: - Reviewed Medicaid formulary. Recommend to stop Breztri, change to Symbicort 2 puffs twice daily and Spiriva Respimat - patient confirms she has used this formulation before.  - Will collaborate with PCP.   Bipolar Disorder - Moderately well controlled per patient report - Will encourage patient to ask psychiatrist for updated medication prescriptions at next appointment for adherence packaging.   Follow Up Plan: phone call in 3 days  Catie TClearance Coots, PharmD, [facility] [phone]

## 2022-07-10 ENCOUNTER — Telehealth: Payer: Self-pay | Admitting: Internal Medicine

## 2022-07-10 ENCOUNTER — Ambulatory Visit: Payer: Medicaid Other | Admitting: Internal Medicine

## 2022-07-10 DIAGNOSIS — Z419 Encounter for procedure for purposes other than remedying health state, unspecified: Secondary | ICD-10-CM | POA: Diagnosis not present

## 2022-07-10 NOTE — Telephone Encounter (Signed)
Told pt she would have to pick up another

## 2022-07-10 NOTE — Telephone Encounter (Signed)
Copied from [place] [phone]. Topic: General - Other >> Jul 10, 2022  9:06 AM Chapman Fitch wrote: Reason for CRM: Pts husband threw away the container that she was suppose to use for stool sample / pt asked what she needs to do / please advise

## 2022-07-10 NOTE — Telephone Encounter (Signed)
Pt called back and re explained why we canceled due to her just recently being seen and her not doing the stool test yet.  She needs to be seen in 2 months for DM f/u unless she has other concerns.  Her friend can not be added on DPR until she comes in person to add.  Pt notified

## 2022-07-10 NOTE — Telephone Encounter (Signed)
Copied from [place] [id number]. Topic: General - Other >> Jul 10, 2022 10:20 AM Ludger Nutting wrote: Patient would like a nurse to reach out to [facility] [phone] to discuss why the appointment for 07/10/22 was moved to 08/28/22. Dawn is currently listed as an EC, but not on the [place]. Patient would like to have her added to the [facility]. Please follow up with patient.

## 2022-07-17 DIAGNOSIS — G4733 Obstructive sleep apnea (adult) (pediatric): Secondary | ICD-10-CM | POA: Diagnosis not present

## 2022-07-17 DIAGNOSIS — E119 Type 2 diabetes mellitus without complications: Secondary | ICD-10-CM | POA: Diagnosis not present

## 2022-07-17 DIAGNOSIS — R2689 Other abnormalities of gait and mobility: Secondary | ICD-10-CM | POA: Diagnosis not present

## 2022-07-17 DIAGNOSIS — G43719 Chronic migraine without aura, intractable, without status migrainosus: Secondary | ICD-10-CM | POA: Diagnosis not present

## 2022-07-17 DIAGNOSIS — I69398 Other sequelae of cerebral infarction: Secondary | ICD-10-CM | POA: Diagnosis not present

## 2022-07-17 DIAGNOSIS — Z79899 Other long term (current) drug therapy: Secondary | ICD-10-CM | POA: Diagnosis not present

## 2022-07-23 ENCOUNTER — Other Ambulatory Visit: Payer: Medicaid Other | Admitting: *Deleted

## 2022-07-23 NOTE — Patient Outreach (Signed)
  Medicaid Managed Care   Unsuccessful Attempt Note   07/23/2022 Name: Stephanie [name] MRN: [id number] DOB: [birth year]  Referred by: Margarita Mail, DO Reason for referral : High Risk Managed Medicaid (Unsuccessful RNCM follow up telephone outreach)   An unsuccessful telephone outreach was attempted today. The patient was referred to the case management team for assistance with care management and care coordination.    Follow Up Plan: A HIPAA compliant phone message was left for the patient providing contact information and requesting a return call. and The Managed Medicaid care management team will reach out to the patient again over the next 14 days.    Estanislado Emms RN, BSN [place]  [facility]

## 2022-07-23 NOTE — Patient Instructions (Signed)
Visit Information  [name]  - as a part of your Medicaid benefit, you are eligible for care management and care coordination services at no cost or copay. I was unable to reach you by phone today but would be happy to help you with your health related needs. Please feel free to call me @ [phone].   A member of the Managed Medicaid care management team will reach out to you again over the next 14 days.   Marshell Rieger RN, BSN [place]  [facility] RN Care Coordinator   

## 2022-07-24 DIAGNOSIS — F4312 Post-traumatic stress disorder, chronic: Secondary | ICD-10-CM | POA: Diagnosis not present

## 2022-07-24 DIAGNOSIS — F319 Bipolar disorder, unspecified: Secondary | ICD-10-CM | POA: Diagnosis not present

## 2022-07-24 DIAGNOSIS — F410 Panic disorder [episodic paroxysmal anxiety] without agoraphobia: Secondary | ICD-10-CM | POA: Diagnosis not present

## 2022-07-24 DIAGNOSIS — F411 Generalized anxiety disorder: Secondary | ICD-10-CM | POA: Diagnosis not present

## 2022-07-30 ENCOUNTER — Other Ambulatory Visit: Payer: Self-pay

## 2022-07-30 ENCOUNTER — Telehealth: Payer: Self-pay | Admitting: Internal Medicine

## 2022-07-30 DIAGNOSIS — E1165 Type 2 diabetes mellitus with hyperglycemia: Secondary | ICD-10-CM

## 2022-07-30 MED ORDER — BLOOD GLUCOSE METER KIT: PACK | 0 refills | Status: DC

## 2022-07-30 NOTE — Telephone Encounter (Unsigned)
Copied from CRM [id number]. Topic: Referral - Request for Referral >> Jul 30, 2022  8:43 AM Everette C wrote: Has patient seen PCP for this complaint? No. *If NO, is insurance requiring patient see PCP for this issue before PCP can refer them? Referral for which specialty: Oncologist  Preferred provider/office: Patient has no preference  Reason for referral: lung concerns

## 2022-07-31 IMAGING — CR DG ABDOMEN 1V
2 series · 2 of 2 positions shown · non-contrast
Comparison: 02/10/2019 abdominal radiograph

CLINICAL DATA: Right abdominal pain. History of right
nephrolithiasis.

EXAM:
ABDOMEN - 1 VIEW

[abdomen kub (1 of 2)]
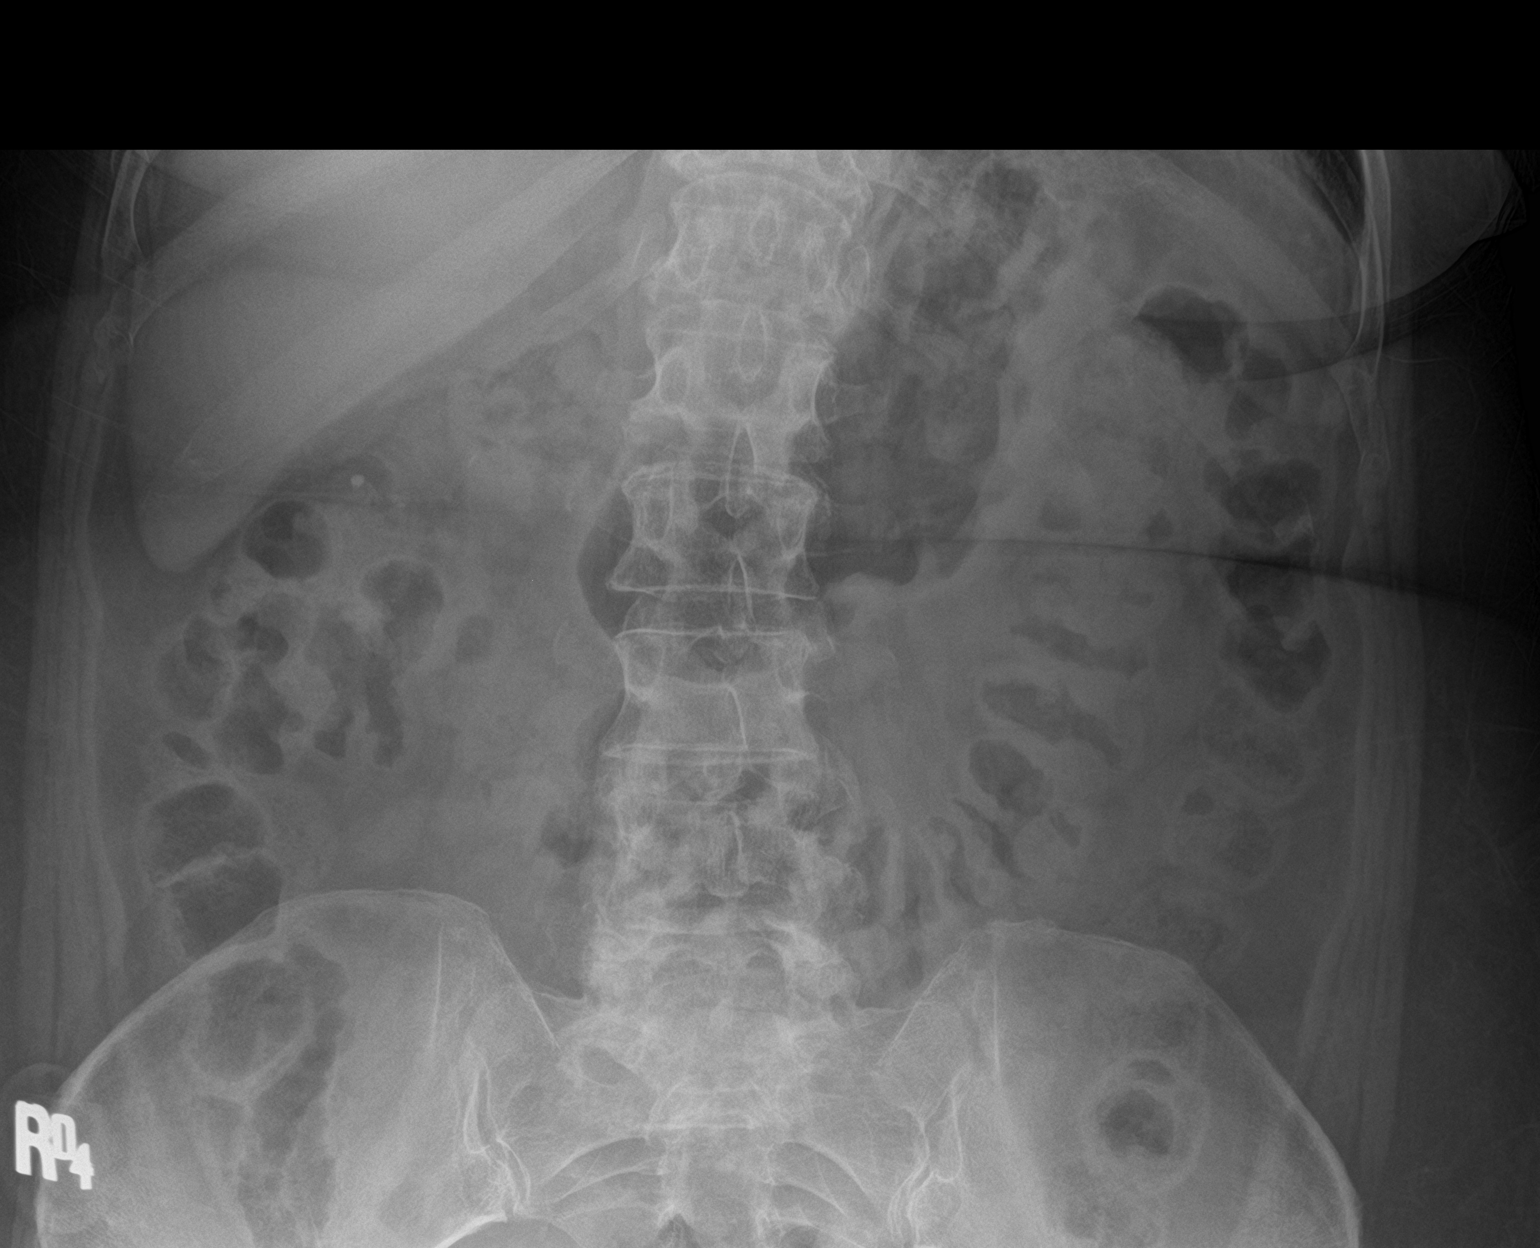

[abdomen kub (2 of 2)]
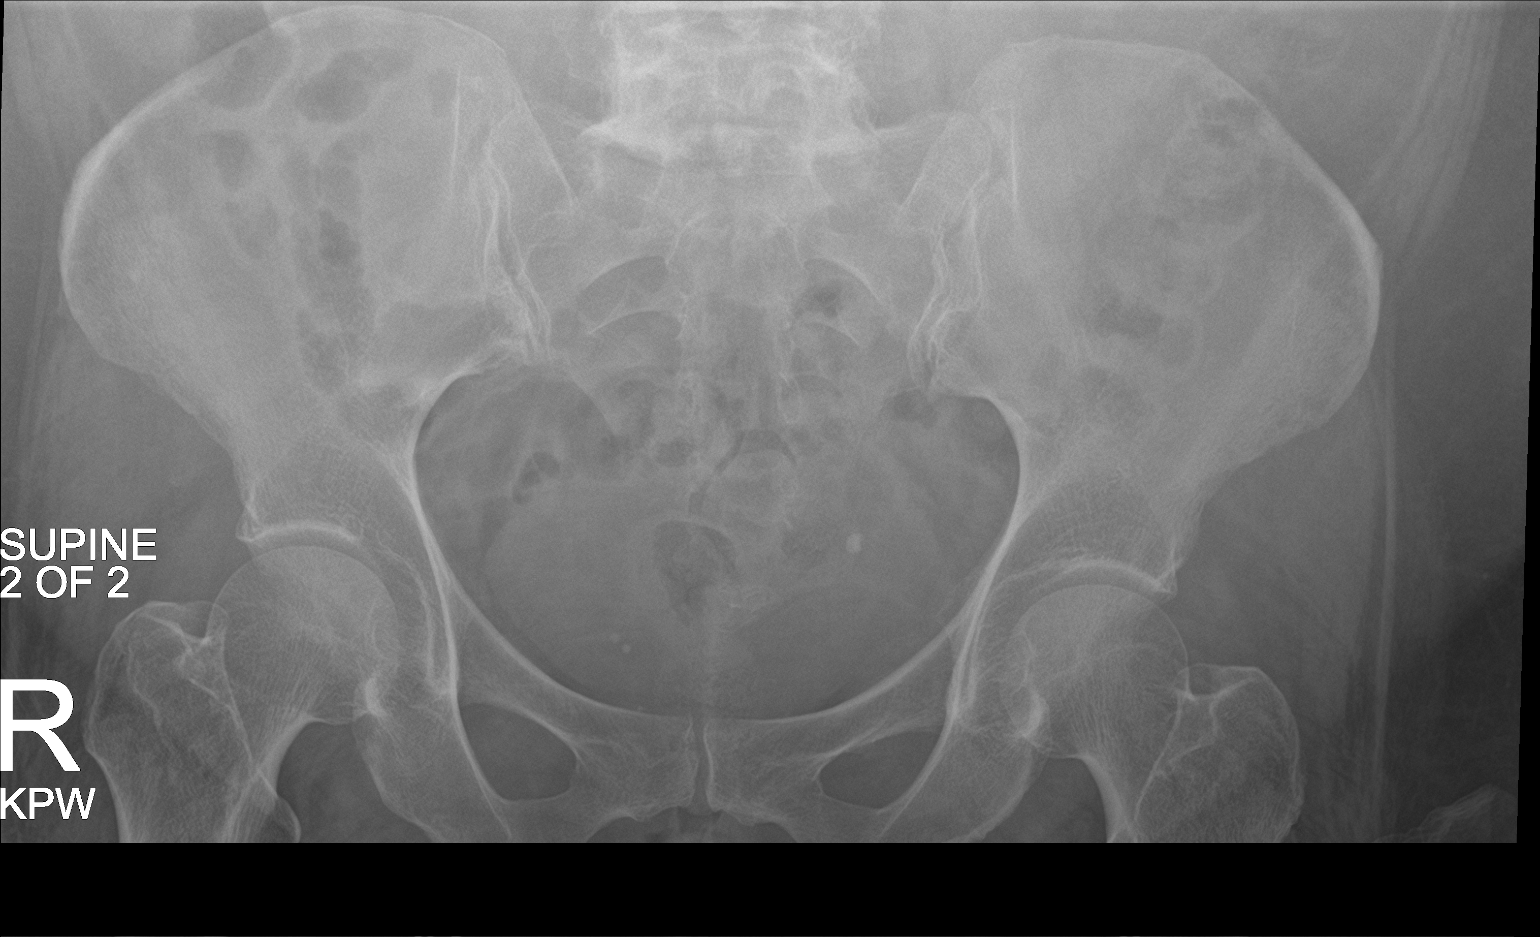

[2 of 2 positions shown; findings below may reference images not displayed]

FINDINGS: There is a 2 mm calcification overlying the upper right kidney. No
additional radiopaque stones overlie the kidneys or expected
locations of the ureters or bladder. Stable calcified small
bilateral deep pelvic venous phleboliths. No dilated small bowel
loops. Mild colonic stool and gas. No evidence of pneumatosis or
pneumoperitoneum. Degenerative changes in the lumbar spine.
IMPRESSION: Tiny 2 mm calcification overlying the upper right kidney. Otherwise
no radiopaque nephrolithiasis.

## 2022-08-05 ENCOUNTER — Encounter: Payer: Self-pay | Admitting: Internal Medicine

## 2022-08-05 ENCOUNTER — Ambulatory Visit (INDEPENDENT_AMBULATORY_CARE_PROVIDER_SITE_OTHER): Payer: Medicaid Other | Admitting: Internal Medicine

## 2022-08-05 ENCOUNTER — Telehealth: Payer: Self-pay

## 2022-08-05 VITALS — BP 132/74 | HR 91 | Temp 97.6°F | Resp 18 | Ht 62.0 in | Wt 197.6 lb

## 2022-08-05 DIAGNOSIS — Z122 Encounter for screening for malignant neoplasm of respiratory organs: Secondary | ICD-10-CM

## 2022-08-05 DIAGNOSIS — I10 Essential (primary) hypertension: Secondary | ICD-10-CM | POA: Diagnosis not present

## 2022-08-05 DIAGNOSIS — E876 Hypokalemia: Secondary | ICD-10-CM

## 2022-08-05 DIAGNOSIS — J439 Emphysema, unspecified: Secondary | ICD-10-CM | POA: Diagnosis not present

## 2022-08-05 DIAGNOSIS — Z7689 Persons encountering health services in other specified circumstances: Secondary | ICD-10-CM | POA: Diagnosis not present

## 2022-08-05 MED ORDER — FLUTICASONE-SALMETEROL 100-50 MCG/ACT IN AEPB: 1.0000 | INHALATION_SPRAY | Freq: Two times a day (BID) | RESPIRATORY_TRACT | 3 refills | Status: DC

## 2022-08-05 NOTE — Progress Notes (Signed)
Acute Office Visit  Subjective:     [name]: Stephanie [name], female    DOB: 04/24/1966, 56 y.o.   MRN: [id number]  Chief Complaint  Patient presents with   abnormal scans    In past states needs referral to cardio for abnormal echo and pulmonology for lung nodule on scans    HPI Patient is in today for questions about lungs. She states that she had some imaging done by her Cardiologist Dr. Welton Flakes in September of 2022 that showed a lung nodule. I cannot find the imaging she is referring to but she is more concerned now because her father was diagnosed with lung cancer. She is a former smoker and quit about 8 months ago but has never had lung cancer screening that I can see. She does have a dry cough that has been going on for the last 2-3 months. She does have some shortness of breath at rest but does well with exertion when she has her inhaler. She uses Breztri daily and Albuterol as needed but hasn't had to use in some time. Occasional wheezes. Has not had to go to the ER within the last year for respiratory reasons. Up to date with vaccines.   Review of Systems  Constitutional:  Negative for chills and fever.  Respiratory:  Positive for cough, shortness of breath and wheezing. Negative for sputum production.       Objective:    BP 132/74   Pulse 91   Temp 97.6 F (36.4 C)   Resp 18   Ht 5\' 2"  (1.575 m)   Wt 197 lb 9.6 oz (89.6 kg)   SpO2 96%   BMI 36.14 kg/m  BP Readings from Last 3 Encounters:  08/05/22 132/74  06/26/22 (!) 146/82  06/19/22 120/64   Wt Readings from Last 3 Encounters:  08/05/22 197 lb 9.6 oz (89.6 kg)  06/26/22 204 lb 12.8 oz (92.9 kg)  06/19/22 195 lb 4.8 oz (88.6 kg)    Physical Exam Constitutional:      Appearance: Normal appearance.  HENT:     Head: Normocephalic and atraumatic.  Eyes:     Conjunctiva/sclera: Conjunctivae normal.  Cardiovascular:     Rate and Rhythm: Normal rate and regular rhythm.  Pulmonary:     Effort: Pulmonary  effort is normal.     Breath sounds: Normal breath sounds. No wheezing, rhonchi or rales.  Neurological:     Mental Status: She is alert. Mental status is at baseline.  Psychiatric:        Mood and Affect: Mood normal.        Behavior: Behavior normal.     No results found for any visits on 08/05/22.      Assessment & Plan:   1. Essential hypertension, malignant/Hypokalemia: Blood pressure much better controlled today, patient states she is taking her Lasix 40 mg daily but is not taking the potassium.  Check BMP today.  Follow-up scheduled for next month.  - Basic Metabolic Panel (BMET)  2. Screening for lung cancer: I am uncertain what imaging she is referring to regarding a pulmonary nodule, however she is due for annual lung cancer screening and a referral will be placed today.  - Ambulatory Referral [facility]  3. Pulmonary emphysema, unspecified emphysema type (HCC): Stable.  She was given a sample of Breztri, however she states Medicare will no longer cover this.  Medicare preferred list will cover Symbicort and Advair, will prescribe today.   -  fluticasone-salmeterol (ADVAIR) 100-50 MCG/ACT AEPB; Inhale 1 puff into the lungs 2 (two) times daily.  Dispense: 1 each; Refill: 3   Return for already scheduled .  Margarita Mail, DO

## 2022-08-05 NOTE — Telephone Encounter (Signed)
..   Medicaid Managed Care Note  08/05/2022 Name: Stephanie [name] MRN: [id number] DOB: [birth year]  Stephanie [name] is a 56 y.o. year old female who is a primary care patient of Margarita Mail, DO and is actively engaged with the care management team. I reached out to [name] by phone today to assist with re-scheduling a follow up visit with the RN Case Manager  Follow up plan: Unsuccessful telephone outreach attempt made. A HIPAA compliant phone message was left for the patient providing contact information and requesting a return call.  The care management team will reach out to the patient again over the next 7 days.    Weston Settle Care Guide, High Risk Medicaid Managed Care Embedded Care Coordination [facility]  [facility]

## 2022-08-06 LAB — BASIC METABOLIC PANEL
BUN: 19 mg/dL (ref 7–25)
CO2: 31 mmol/L (ref 20–32)
Calcium: 9.8 mg/dL (ref 8.6–10.4)
Chloride: 102 mmol/L (ref 98–110)
Creat: 0.81 mg/dL (ref 0.50–1.03)
Glucose, Bld: 140 mg/dL — ABNORMAL HIGH (ref 65–99)
Potassium: 3.5 mmol/L (ref 3.5–5.3)
Sodium: 142 mmol/L (ref 135–146)

## 2022-08-07 DIAGNOSIS — R2689 Other abnormalities of gait and mobility: Secondary | ICD-10-CM | POA: Diagnosis not present

## 2022-08-07 DIAGNOSIS — I69398 Other sequelae of cerebral infarction: Secondary | ICD-10-CM | POA: Diagnosis not present

## 2022-08-07 DIAGNOSIS — Z79899 Other long term (current) drug therapy: Secondary | ICD-10-CM | POA: Diagnosis not present

## 2022-08-07 DIAGNOSIS — G43719 Chronic migraine without aura, intractable, without status migrainosus: Secondary | ICD-10-CM | POA: Diagnosis not present

## 2022-08-08 ENCOUNTER — Telehealth: Payer: Self-pay | Admitting: Gastroenterology

## 2022-08-08 NOTE — Telephone Encounter (Signed)
Called patient to cancel appointment for 08/12/2022. Patient did not answer. I left a message for a call back.

## 2022-08-09 DIAGNOSIS — Z419 Encounter for procedure for purposes other than remedying health state, unspecified: Secondary | ICD-10-CM | POA: Diagnosis not present

## 2022-08-09 NOTE — Telephone Encounter (Signed)
Called patient to cancel upcoming appointment, got no answer. Left vm for a call back.

## 2022-08-12 ENCOUNTER — Ambulatory Visit: Payer: Medicaid Other | Admitting: Gastroenterology

## 2022-08-14 ENCOUNTER — Other Ambulatory Visit: Payer: Self-pay | Admitting: *Deleted

## 2022-08-14 ENCOUNTER — Encounter: Payer: Self-pay | Admitting: *Deleted

## 2022-08-14 ENCOUNTER — Other Ambulatory Visit: Payer: Medicaid Other | Admitting: *Deleted

## 2022-08-14 DIAGNOSIS — Z122 Encounter for screening for malignant neoplasm of respiratory organs: Secondary | ICD-10-CM

## 2022-08-14 DIAGNOSIS — Z87891 Personal history of nicotine dependence: Secondary | ICD-10-CM

## 2022-08-14 NOTE — Patient Instructions (Signed)
Visit Information  Ms. Dawkins was given information about Medicaid Managed Care team care coordination services as a part of their [facility] Medicaid benefit. KHRISTIN KELEHER verbally consented to engagement with the [facility] Managed Care team.   If you are experiencing a medical emergency, please call 911 or report to your local emergency department or urgent care.   If you have a non-emergency medical problem during routine business hours, please contact your provider's office and ask to speak with a nurse.   For questions related to your [facility] health plan, please call: [phone] or go here:[email or web address]  If you would like to schedule transportation through your [facility] plan, please call the following number at least 2 days in advance of your appointment: [phone].  You can also use the MTM portal or MTM mobile app to manage your rides. For the portal, please go to [email or web address]  Call the [facility] Crisis Line at [phone], at any time, 24 hours a day, 7 days a week. If you are in danger or need immediate medical attention call 911.  If you would like help to quit smoking, call 1-800-QUIT-NOW (307-204-6746) OR Espaol: 1-855-Djelo-Ya (4-132-440-1027) o para ms informacin haga clic aqu or Text READY to 253-664 to register via text  Ms. Vear Clock,   Please see education materials related to fall provided by MyChart link.  Patient verbalizes understanding of instructions and care plan provided today and agrees to view in MyChart. Active MyChart status and patient understanding of how to access instructions and care plan via MyChart confirmed with patient.     Telephone follow up appointment with Managed Medicaid care management team member scheduled for:09/13/22 @ 1:15 pm  Estanislado Emms RN, BSN [place]  [facility] RN Care Coordinator   Following is a copy of your plan of care:  Care Plan : RN Care  Manager Plan of Care  Updates made by Heidi Dach, RN since 08/14/2022 12:00 AM     Problem: Development of Plan of Care to address Health Management needs related to Stroke history      Long-Range Goal: Development of Plan of Care to address Health Management needs related to Stroke history   Start Date: 05/08/2022  Expected End Date: 08/06/2022  Priority: High  Note:   Current Barriers:  Chronic Disease Management support and education needs related to Improving Health with history of Stroke Patient having difficulty affording her medications and sensors. Freestyle Libre 14 day is $49 every 14 days. She is riding an exercise bike at home and reports her legs are getting stronger and improved mobility. She is interested in compliance packaging for her medications.  RNCM Clinical Goal(s):  Patient will verbalize understanding of plan for management of Improving Health with history of stroke as evidenced by patient reports attend all scheduled medical appointments: 09/04/22 with PCP, call to schedule lung cancer screening as evidenced by provider documentation in EMR        continue to work with RN Care Manager and/or Social Worker to address care management and care coordination needs related to improving health with history of stroke as evidenced by adherence to CM Team Scheduled appointments     through collaboration with Medical illustrator, provider, and care team.   Interventions: Inter-disciplinary care team collaboration (see longitudinal plan of care) Evaluation of current treatment plan related to  self management and patient's adherence to plan as established by provider Collaborate with PCP regarding patient needing medications covered by Medicaid, list of  covered medications sent Provided patient with [facility] [phone], to call and schedule lung cancer screening Provided patient with Medical [facility] [phone] and [facility] [phone] both offer  compliance packaging and free delivery   Diabetes Interventions:  (Status:  New goal.) Long Term Goal Assessed patient's understanding of A1c goal: <7% Provided education to patient about basic DM disease process Reviewed medications with patient and discussed importance of medication adherence Counseled on importance of regular laboratory monitoring as prescribed Discussed plans with patient for ongoing care management follow up and provided patient with direct contact information for care management team Reviewed scheduled/upcoming provider appointments including: 09/04/22 with PCP and schedule with [place] [facility] for Lung Cancer Screening Advised patient, providing education and rationale, to check cbg 4 times a day and record, calling provider for findings outside established parameters Collaborated with PCP regarding CGM covered by Medicaid, list of covered medications sent to PCP  Lab Results  Component Value Date   HGBA1C 7.8 (A) 05/29/2022   Stroke:  (Status:Goal on track:  Yes.) Long Term Goal Reviewed Importance of taking all medications as prescribed Reviewed Importance of attending all scheduled provider appointments Advised to report any changes in symptoms or exercise tolerance Assessed social determinant of health barriers Reviewed the importance of exercise Reviewed recent Neurology note Encouraged patient to continue to exercise at home(stationary bike)    Patient Goals/Self-Care Activities: Take medications as prescribed   Attend all scheduled provider appointments Call provider office for new concerns or questions

## 2022-08-14 NOTE — Patient Outreach (Signed)
Medicaid Managed Care   Nurse Care Manager Note  08/14/2022 Name:  TARON CONREY MRN:  [id number] DOB:  08/30/1966  SHEVETTE BESS is an 56 y.o. year old female who is a primary patient of Teodora Medici, DO.  The [facility] Managed Care Coordination team was consulted for assistance with:    DMII Stroke  Ms. Barrell was given information about Medicaid Managed Care Coordination team services today. [name] Patient agreed to services and verbal consent obtained.  Engaged with patient by telephone for follow up visit in response to provider referral for case management and/or care coordination services.   Assessments/Interventions:  Review of past medical history, allergies, medications, health status, including review of consultants reports, laboratory and other test data, was performed as part of comprehensive evaluation and provision of chronic care management services.  SDOH (Social Determinants of Health) assessments and interventions performed: SDOH Interventions    Flowsheet Row Patient Outreach Telephone from 08/14/2022 in [place] Patient Outreach Telephone from 05/08/2022 in [place] Coordination Office Visit from 03/29/2022 in [facility]  SDOH Interventions     Food Insecurity Interventions -- Intervention Not Indicated --  Housing Interventions -- Intervention Not Indicated --  Transportation Interventions -- Other (Comment)  [Provided with [employer] transportation] --  Utilities Interventions Intervention Not Indicated -- --  Depression Interventions/Treatment  -- -- Medication       Care Plan  Allergies  Allergen Reactions   Gabapentin Itching   Ibuprofen Itching   Sulfa Antibiotics Hives   Tylenol [Acetaminophen] Itching   Aspirin Itching   Buprenorphine Hcl Itching   Carbamazepine Itching, Other (See Comments) and Rash    Makes her feel like something is crawling  under her skin.   Codeine Itching and Other (See Comments)    Makes her feel like something is crawling under her skin. Can take, sometimes makes her itch   Compazine Itching and Other (See Comments)    "makes my skin crawl"   Elemental Sulfur Hives, Rash and Other (See Comments)    Skin Rashes, Hives   Ioxaglate Itching   Ivp Dye [Iodinated Contrast Media] Itching and Other (See Comments)    Makes her itch really bad. Had to get 2-3 shots of Benadryl when she was in the hospital.   Metrizamide Itching   Naproxen Itching   Norco [Hydrocodone-Acetaminophen] Itching   Other Itching and Other (See Comments)    Makes her itch really bad. Had to get 2-3 shots of Benadryl when she was in the hospital.   Penicillin G Hives, Rash and Other (See Comments)    Has patient had a PCN reaction causing immediate rash, facial/tongue/throat swelling, SOB or lightheadedness with hypotension: Yes Has patient had a PCN reaction causing severe rash involving mucus membranes or skin necrosis: No Has patient had a PCN reaction that required hospitalization: No Has patient had a PCN reaction occurring within the last 10 years: No If all of the above answers are "NO", then may proceed with Cephalosporin use.    Prochlorperazine Maleate Other (See Comments)    Compazine makes her skin crawl - needs 2-3 shots of Benadryl to get relief.   Reglan [Metoclopramide] Itching and Other (See Comments)    Makes her skin crawl   Toradol [Ketorolac Tromethamine] Itching   Tramadol Itching   Zofran [Ondansetron Hcl] Itching    Medications Reviewed Today     Reviewed by Melissa Montane, RN (Registered Nurse)  on 08/14/22 at 1302  Med List Status: <None>   Medication Order Taking? Sig Documenting Provider Last Dose Status Informant  albuterol (VENTOLIN HFA) 108 (90 Base) MCG/ACT inhaler [id number] Yes Inhale 2 puffs into the lungs every 6 (six) hours as needed for wheezing or shortness of breath. Myles Gip, DO  Taking Active Self, Multiple Informants  ARIPiprazole (ABILIFY) 15 MG tablet [id number] Yes Take 15 mg by mouth every morning. [provider] Taking Active Self, Multiple Informants  blood glucose meter kit and supplies [id number] Yes Dispense based on patient and insurance preference. Use up to four times daily as directed. (FOR ICD-10 E10.9, E11.9). Teodora Medici, DO Taking Active   CAPLYTA 42 MG capsule [id number] Yes Take 42 mg by mouth every evening. [provider] Taking Active Self, Multiple Informants  clopidogrel (PLAVIX) 75 MG tablet [id number] Yes Take 75 mg by mouth daily. [provider] Taking Active Self, Multiple Informants  diazepam (VALIUM) 10 MG tablet [id number] Yes Take 10 mg by mouth 3 (three) times daily. [provider] Taking Active Self, Multiple Informants           Med Note Cleopatra Cedar Jul 08, 2022 10:32 AM) Takes 1-2 QPM  fluticasone-salmeterol (ADVAIR) 100-50 MCG/ACT AEPB [id number] Yes Inhale 1 puff into the lungs 2 (two) times daily. Teodora Medici, DO Taking Active   furosemide (LASIX) 40 MG tablet [id number] Yes Take 1 tablet (40 mg total) by mouth 2 (two) times daily as needed for edema (take 40 mg in the morning, can take additional 40 mg in afternoon if needed for edema). Bo Merino, FNP Taking Active            Med Note Jodi Mourning, Toney Reil Jul 08, 2022 10:49 AM) Taking 40 mg twice daily  lamoTRIgine (LAMICTAL) 100 MG tablet [id number] Yes Take 100 mg by mouth 2 (two) times daily. [provider] Taking Active   metFORMIN (GLUCOPHAGE-XR) 500 MG 24 hr tablet [id number] Yes Take 1 tablet (500 mg total) by mouth 2 (two) times daily with a meal. Teodora Medici, DO Taking Active   omeprazole (PRILOSEC) 40 MG capsule [id number] Yes Take 1 capsule (40 mg total) by mouth daily. Teodora Medici, DO Taking Active   pioglitazone (ACTOS) 15 MG tablet [id number] Yes Take 1 tablet (15 mg total) by  mouth daily. Bo Merino, FNP Taking Active   potassium chloride SA (KLOR-CON M) 20 MEQ tablet [id number] Yes Take 20 mEq two times a day for three days, then once a day with lasix Bo Merino, FNP Taking Active            Med Note Jodi Mourning, Toney Reil Jul 08, 2022 10:50 AM) Taking twice daily  prazosin (MINIPRESS) 2 MG capsule [id number] Yes Take 4 mg by mouth every evening. [provider] Taking Active Self, Multiple Informants  traZODone (DESYREL) 150 MG tablet [id number] Yes Take by mouth at bedtime. [provider] Taking Active   Vilazodone HCl (VIIBRYD) 40 MG TABS [id number] Yes Take 40 mg by mouth daily. [provider] Taking Active   Med List Note Brunilda Payor, CPhT 04/17/22 1014): Augustina Mood Friend   [phone]             Patient Active Problem List   Diagnosis Date Noted   Hypokalemia 04/17/2022   Leg swelling 02/13/2022   Wheezing 02/13/2022   Migraine 05/12/2020   Type 2 diabetes mellitus ([place]) 08/22/2018  Other specified cardiac dysrhythmias 08/22/2018   Acute encephalopathy 08/21/2018   Kidney stone 03/10/2018   UTI (urinary tract infection) 08/21/2017   History of CVA (cerebrovascular accident) 01/28/2017   History of migraine 01/28/2017   Small bowel obstruction ([place]) 09/02/2016   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   Bipolar disorder ([place]) 05/09/2016   Hyperlipidemia, unspecified 05/09/2016   Irritable bowel syndrome 05/09/2016   Lumbago with sciatica 05/09/2016   Esophageal reflux 05/09/2016   Scoliosis 05/09/2016   Diverticulosis of colon 05/09/2016   DDD (degenerative disc disease), cervical 05/09/2016   ICH (intracerebral hemorrhage) ([place])    Essential hypertension, malignant 04/19/2016   Cytotoxic brain edema ([place]) 04/19/2016   IVH (intraventricular hemorrhage) ([place]) 04/18/2016   Bilateral carpal tunnel syndrome 04/12/2016   Falls frequently 01/12/2016   Trigeminal neuralgia 06/08/2015   Sacroiliac  dysfunction 03/04/2014   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 or radiculopathy 05/20/2013   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   Tobacco use disorder 09/11/2012   History of cervical cancer 07/30/2012   Hepatitis C     Conditions to be addressed/monitored per PCP order:  DMII and Stroke  Care Plan : RN Care Manager Plan of Care  Updates made by Melissa Montane, RN since 08/14/2022 12:00 AM     Problem: Development of Plan of Care to address Health Management needs related to Stroke history      Long-Range Goal: Development of Plan of Care to address Health Management needs related to Stroke history   Start Date: 05/08/2022  Expected End Date: 08/06/2022  Priority: High  Note:   Current Barriers:  Chronic Disease Management support and education needs related to Improving Health with history of Stroke Patient having difficulty affording her medications and sensors. Freestyle Libre 14 day is $49 every 14 days. She is riding an exercise bike at home and reports her legs are getting stronger and improved mobility. She is interested in compliance packaging for her medications.  RNCM Clinical Goal(s):  Patient will verbalize understanding of plan for management of Improving Health with history of stroke as evidenced by patient reports attend all scheduled medical appointments: 09/04/22 with PCP, call to schedule lung cancer screening as evidenced by provider documentation in EMR        continue to work with RN Care Manager and/or Social Worker to address care management and care coordination needs related to improving health with history of stroke as evidenced by adherence to CM Team Scheduled appointments     through collaboration with Consulting civil engineer, provider, and care team.   Interventions: Inter-disciplinary care team collaboration (see longitudinal plan of care) Evaluation of current treatment plan related to  self management and patient's adherence to plan as  established by provider Collaborate with PCP regarding patient needing medications covered by Medicaid, list of covered medications sent Provided patient with [facility] [phone], to call and schedule lung cancer screening Provided patient with [facility] [phone] and [facility] [phone] both offer compliance packaging and free delivery   Diabetes Interventions:  (Status:  New goal.) Long Term Goal Assessed patient's understanding of A1c goal: <7% Provided education to patient about basic DM disease process Reviewed medications with patient and discussed importance of medication adherence Counseled on importance of regular laboratory monitoring as prescribed Discussed plans with patient for ongoing care management follow up and provided patient with direct contact information for care management team Reviewed scheduled/upcoming provider appointments including: 09/04/22 with PCP and schedule with [facility] for Lung Cancer  Screening Advised patient, providing education and rationale, to check cbg 4 times a day and record, calling provider for findings outside established parameters Collaborated with PCP regarding CGM covered by Medicaid, list of covered medications sent to PCP  Lab Results  Component Value Date   HGBA1C 7.8 (A) 05/29/2022   Stroke:  (Status:Goal on track:  Yes.) Long Term Goal Reviewed Importance of taking all medications as prescribed Reviewed Importance of attending all scheduled provider appointments Advised to report any changes in symptoms or exercise tolerance Assessed social determinant of health barriers Reviewed the importance of exercise Reviewed recent Neurology note Encouraged patient to continue to exercise at home(stationary bike)    Patient Goals/Self-Care Activities: Take medications as prescribed   Attend all scheduled provider appointments Call provider office for new concerns or questions         Follow Up:  Patient agrees to Care Plan and Follow-up.  Plan: The Managed Medicaid care management team will reach out to the patient again over the next 30 days.  Date/time of next scheduled RN care management/care coordination outreach:  09/13/22 @ 1:15pm  Lurena Joiner RN, BSN [employer]  [employer] manager

## 2022-08-19 DIAGNOSIS — Z86718 Personal history of other venous thrombosis and embolism: Secondary | ICD-10-CM | POA: Diagnosis not present

## 2022-08-19 DIAGNOSIS — B1920 Unspecified viral hepatitis C without hepatic coma: Secondary | ICD-10-CM | POA: Diagnosis not present

## 2022-08-19 DIAGNOSIS — Z79899 Other long term (current) drug therapy: Secondary | ICD-10-CM | POA: Diagnosis not present

## 2022-08-19 DIAGNOSIS — N200 Calculus of kidney: Secondary | ICD-10-CM | POA: Diagnosis not present

## 2022-08-19 DIAGNOSIS — M7601 Gluteal tendinitis, right hip: Secondary | ICD-10-CM | POA: Diagnosis not present

## 2022-08-19 DIAGNOSIS — M9904 Segmental and somatic dysfunction of sacral region: Secondary | ICD-10-CM | POA: Diagnosis not present

## 2022-08-19 DIAGNOSIS — Z7902 Long term (current) use of antithrombotics/antiplatelets: Secondary | ICD-10-CM | POA: Diagnosis not present

## 2022-08-19 DIAGNOSIS — E119 Type 2 diabetes mellitus without complications: Secondary | ICD-10-CM | POA: Diagnosis not present

## 2022-08-19 DIAGNOSIS — M546 Pain in thoracic spine: Secondary | ICD-10-CM | POA: Diagnosis not present

## 2022-08-19 DIAGNOSIS — M533 Sacrococcygeal disorders, not elsewhere classified: Secondary | ICD-10-CM | POA: Diagnosis not present

## 2022-08-19 DIAGNOSIS — I10 Essential (primary) hypertension: Secondary | ICD-10-CM | POA: Diagnosis not present

## 2022-08-19 DIAGNOSIS — Z7689 Persons encountering health services in other specified circumstances: Secondary | ICD-10-CM | POA: Diagnosis not present

## 2022-08-19 DIAGNOSIS — Z8673 Personal history of transient ischemic attack (TIA), and cerebral infarction without residual deficits: Secondary | ICD-10-CM | POA: Diagnosis not present

## 2022-08-19 DIAGNOSIS — M7918 Myalgia, other site: Secondary | ICD-10-CM | POA: Diagnosis not present

## 2022-08-19 DIAGNOSIS — Z7983 Long term (current) use of bisphosphonates: Secondary | ICD-10-CM | POA: Diagnosis not present

## 2022-08-19 DIAGNOSIS — Z87891 Personal history of nicotine dependence: Secondary | ICD-10-CM | POA: Diagnosis not present

## 2022-08-19 DIAGNOSIS — G8929 Other chronic pain: Secondary | ICD-10-CM | POA: Diagnosis not present

## 2022-08-22 IMAGING — CR DG CHEST 2V
2 series · 2 of 2 positions shown · non-contrast
Comparison: Chest radiograph dated 05/11/2020.

CLINICAL DATA: 54-year-old female with chest pain.

EXAM:
CHEST - 2 VIEW

[w chest pa]
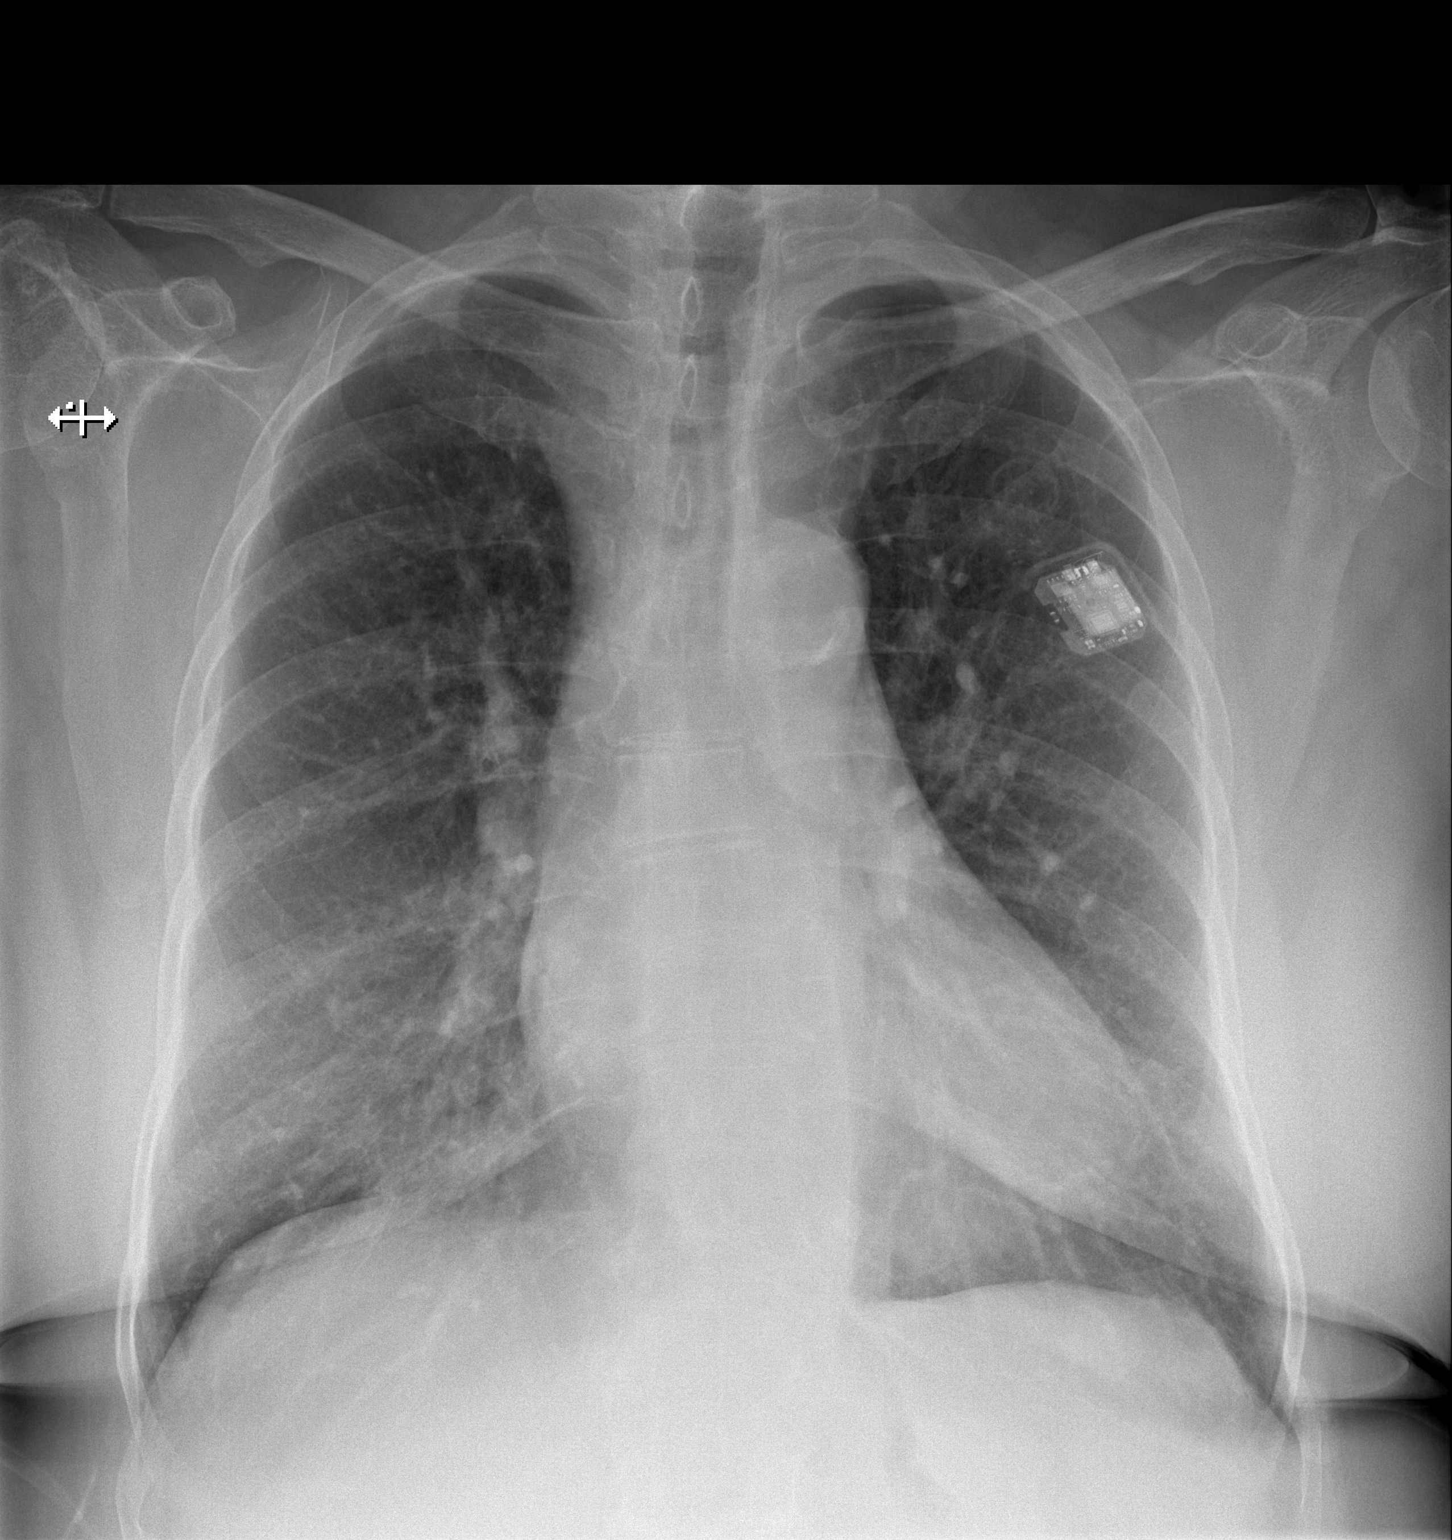

[w chest lat]
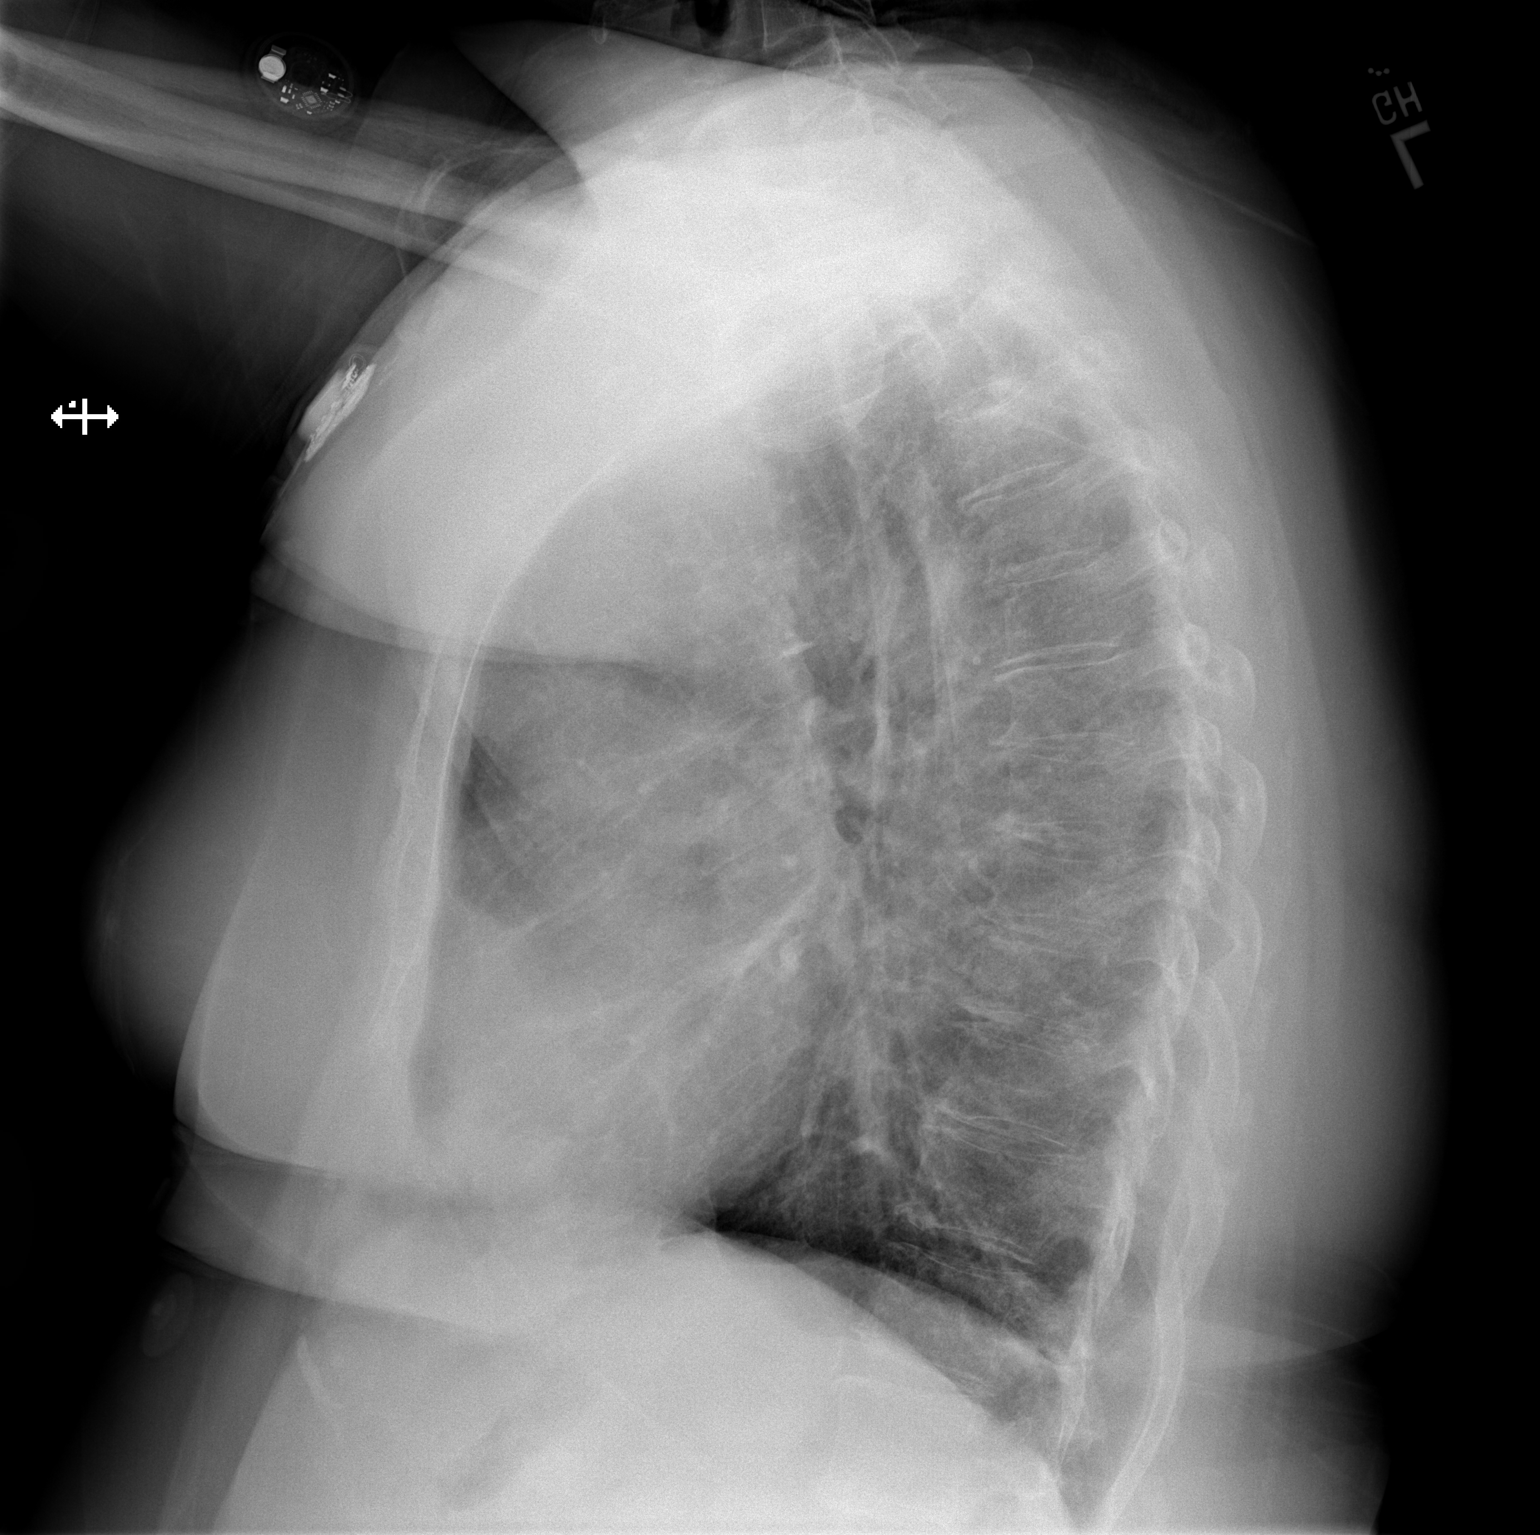

[2 of 2 positions shown; findings below may reference images not displayed]

FINDINGS: There is chronic interstitial coarsening and bronchitic changes. No
focal consolidation, pleural effusion, pneumothorax. Stable cardiac
silhouette. Atherosclerotic calcification of the aorta. Degenerative
changes of the spine. No acute osseous pathology. Recording device
over the left chest wall.
IMPRESSION: No active cardiopulmonary disease.

## 2022-08-28 ENCOUNTER — Ambulatory Visit: Payer: Medicaid Other | Admitting: Internal Medicine

## 2022-09-09 DIAGNOSIS — Z419 Encounter for procedure for purposes other than remedying health state, unspecified: Secondary | ICD-10-CM | POA: Diagnosis not present

## 2022-09-09 NOTE — Progress Notes (Unsigned)
Established Patient Office Visit  Subjective   [name]: Stephanie [name], female    DOB: October 31, 1965  Age: 57 y.o. MRN: [id number]  No chief complaint on file.   HPI Patient is here for follow up. Here with step-son.   Fall: [place] about 5 days ago when she felt like both of her legs gave out -Denies LOC or hitting her head but has had issues with her vision going dark when she bends forward, feeling lightheaded -Is apparently taking 4 blood pressure medications but the only one on her list is Lasix   Diabetes, Type 2: -Last A1c9/23 7.8% -Medications: Metformin 1000 mg BID (had been on Glipizide in the past) -Patient is compliant with the above medications and reports no side effects.  -Checking BG at home: 170 fasting; random 200-290 -Diet: eats sausage sandwich on wheat bread, no lunch  -Eye exam: UTD 8/23 -Foot exam: UTD, will do at follow up  -Microalbumin: UTD 6/23 -Statin: yes -PNA vaccine: UTD -Denies symptoms of hypoglycemia, polyuria, polydipsia, numbness extremities, foot ulcers/trauma.   HLD/Hx of CVA: -CVA August 2017, follows with Neurology, seeing them later this week  -Medications: Crestor 20 mg not on Plavix 75 mg but it is on her list  -Patient is compliant with above medications and reports no side effects.  -Last lipid panel: Lipid Panel     Component Value Date/Time   CHOL 123 02/13/2022 1211   TRIG 69 02/13/2022 1211   HDL 73 02/13/2022 1211   CHOLHDL 1.7 02/13/2022 1211   VLDL 18 04/22/2016 0357   LDLCALC 35 02/13/2022 1211   COPD: -COPD status: stable -Current medications: Breztri 160, Albuterol PRN -Satisfied with current treatment?: yes -Oxygen use: no -Dyspnea frequency: occasional  -Cough frequency: no -Rescue inhaler frequency:  3 times daily right now but she is out of her Breztri  -Limitation of activity: yes -Productive cough: no -Pneumovax: Not up to Date -Influenza: Not up to Date -Former smoker; quit smoking 6 months ago    Bilateral Nephrolithiasis: -Chronic, following with Urology, seeing them later today  GERD: -Currently on Prilosec 40 mg daily   Depression: -Follows with Psychiatry at [place] 15 mg, Valium, uncertain of other meds  Patient Active Problem List   Diagnosis Date Noted   Hypokalemia 04/17/2022   Leg swelling 02/13/2022   Wheezing 02/13/2022   Migraine 05/12/2020   Type 2 diabetes mellitus ([place]) 08/22/2018   Other specified cardiac dysrhythmias 08/22/2018   Acute encephalopathy 08/21/2018   Kidney stone 03/10/2018   UTI (urinary tract infection) 08/21/2017   History of CVA (cerebrovascular accident) 01/28/2017   History of migraine 01/28/2017   Small bowel obstruction ([place]) 09/02/2016   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   Bipolar disorder ([place]) 05/09/2016   Hyperlipidemia, unspecified 05/09/2016   Irritable bowel syndrome 05/09/2016   Lumbago with sciatica 05/09/2016   Esophageal reflux 05/09/2016   Scoliosis 05/09/2016   Diverticulosis of colon 05/09/2016   DDD (degenerative disc disease), cervical 05/09/2016   ICH (intracerebral hemorrhage) ([place])    Essential hypertension, malignant 04/19/2016   Cytotoxic brain edema ([place]) 04/19/2016   IVH (intraventricular hemorrhage) ([place]) 04/18/2016   Bilateral carpal tunnel syndrome 04/12/2016   Falls frequently 01/12/2016   Trigeminal neuralgia 06/08/2015   Sacroiliac dysfunction 03/04/2014   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 or radiculopathy 05/20/2013   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   Tobacco use disorder 09/11/2012   History of cervical cancer 07/30/2012   Hepatitis C  Past Medical History:  Diagnosis Date   Closed fracture of shaft of humerus 01/20/2017   Diabetes mellitus without complication ([place])    Difficult intubation    Diverticulitis    Headache    Hepatitis C    treated and resolved   History of kidney stones    Hypertension    Hypotension 08/22/2018    IBS (irritable bowel syndrome)    Kidney stones    Sciatica    Sleep apnea    on cpap   Stroke ([place]) 2017   memory loss   Superficial venous thrombosis of arm, left 07/22/2016   Past Surgical History:  Procedure Laterality Date   ABDOMINAL HYSTERECTOMY  2007   carpel tunnel syndrome  2017   HARDWARE REMOVAL Right 12/22/2017   Procedure: HARDWARE REMOVAL-RIGHT ARM;  Surgeon: Lovell Sheehan, MD;  Location: [facility] ORS;  Service: Orthopedics;  Laterality: Right;  Removal of implant, superficial right humerus   HUMERUS IM NAIL Right 03/11/2017   Procedure: INTRAMEDULLARY (IM) NAIL HUMERAL;  Surgeon: Lovell Sheehan, MD;  Location: [facility] ORS;  Service: Orthopedics;  Laterality: Right;   KIDNEY STONE SURGERY Right 02/12/2012   KIDNEY STONE SURGERY Left 07/15/2012   LIGATION OF ARTERIOVENOUS  FISTULA Left 06/21/2016   Procedure: LIGATION OF ARTERIOVENOUS  FISTULA ( LIGATION BASILIC VEIN );  Surgeon: Katha Cabal, MD;  Location: [facility] ORS;  Service: Vascular;  Laterality: Left;   LITHOTRIPSY     OOPHORECTOMY     SINUS EXPLORATION     TONSILLECTOMY     Social History   Tobacco Use   Smoking status: Every Day    Packs/day: 1.00    Years: 1.50    Total pack years: 1.50    Types: Cigarettes   Smokeless tobacco: Never  Vaping Use   Vaping Use: Never used  Substance Use Topics   Alcohol use: Not Currently    Comment: no alcohol since 2002   Drug use: No   Social History   Socioeconomic History   Marital status: Legally Separated    Spouse name: Not on file   Number of children: Not on file   Years of education: Not on file   Highest education level: Not on file  Occupational History   Not on file  Tobacco Use   Smoking status: Every Day    Packs/day: 1.00    Years: 1.50    Total pack years: 1.50    Types: Cigarettes   Smokeless tobacco: Never  Vaping Use   Vaping Use: Never used  Substance and Sexual Activity   Alcohol use: Not Currently    Comment: no alcohol since  2002   Drug use: No   Sexual activity: Not on file  Other Topics Concern   Not on file  Social History Narrative   Not on file   Social Determinants of Health   Financial Resource Strain: Not on file  Food Insecurity: No Food Insecurity (05/08/2022)   Hunger Vital Sign    Worried About Running Out of Food in the Last Year: Never true    Ran Out of Food in the Last Year: Never true  Transportation Needs: Unmet Transportation Needs (05/08/2022)   PRAPARE - Hydrologist (Medical): Yes    Lack of Transportation (Non-Medical): No  Physical Activity: Not on file  Stress: Not on file  Social Connections: Not on file  Intimate Partner Violence: Not on file   Family Status  Relation Name  Status   Mother  Deceased   Father  Alive   Sister  Deceased   Family History  Problem Relation Age of Onset   Heart failure Mother    Diabetes Father    Cancer Sister    Allergies  Allergen Reactions   Gabapentin Itching   Ibuprofen Itching   Sulfa Antibiotics Hives   Tylenol [Acetaminophen] Itching   Aspirin Itching   Buprenorphine Hcl Itching   Carbamazepine Itching, Other (See Comments) and Rash    Makes her feel like something is crawling under her skin.   Codeine Itching and Other (See Comments)    Makes her feel like something is crawling under her skin. Can take, sometimes makes her itch   Compazine Itching and Other (See Comments)    "makes my skin crawl"   Elemental Sulfur Hives, Rash and Other (See Comments)    Skin Rashes, Hives   Ioxaglate Itching   Ivp Dye [Iodinated Contrast Media] Itching and Other (See Comments)    Makes her itch really bad. Had to get 2-3 shots of Benadryl when she was in the hospital.   Metrizamide Itching   Naproxen Itching   Norco [Hydrocodone-Acetaminophen] Itching   Other Itching and Other (See Comments)    Makes her itch really bad. Had to get 2-3 shots of Benadryl when she was in the hospital.   Penicillin G Hives,  Rash and Other (See Comments)    Has patient had a PCN reaction causing immediate rash, facial/tongue/throat swelling, SOB or lightheadedness with hypotension: Yes Has patient had a PCN reaction causing severe rash involving mucus membranes or skin necrosis: No Has patient had a PCN reaction that required hospitalization: No Has patient had a PCN reaction occurring within the last 10 years: No If all of the above answers are "NO", then may proceed with Cephalosporin use.    Prochlorperazine Maleate Other (See Comments)    Compazine makes her skin crawl - needs 2-3 shots of Benadryl to get relief.   Reglan [Metoclopramide] Itching and Other (See Comments)    Makes her skin crawl   Toradol [Ketorolac Tromethamine] Itching   Tramadol Itching   Zofran [Ondansetron Hcl] Itching    Review of Systems  Constitutional:  Negative for chills and fever.  Respiratory:  Negative for cough, shortness of breath and wheezing.   Cardiovascular:  Negative for chest pain.  Gastrointestinal:  Negative for abdominal pain, heartburn, nausea and vomiting.      Objective:     There were no vitals taken for this visit. BP Readings from Last 3 Encounters:  08/05/22 132/74  06/26/22 (!) 146/82  06/19/22 120/64   Wt Readings from Last 3 Encounters:  08/05/22 197 lb 9.6 oz (89.6 kg)  06/26/22 204 lb 12.8 oz (92.9 kg)  06/19/22 195 lb 4.8 oz (88.6 kg)     Physical Exam Constitutional:      Appearance: Normal appearance.  HENT:     Head: Normocephalic and atraumatic.  Eyes:     Conjunctiva/sclera: Conjunctivae normal.  Cardiovascular:     Rate and Rhythm: Normal rate and regular rhythm.     Pulses:          Dorsalis pedis pulses are 2+ on the right side and 2+ on the left side.  Pulmonary:     Effort: Pulmonary effort is normal.     Breath sounds: Normal breath sounds.  Musculoskeletal:     Right foot: Normal range of motion. No deformity, bunion, Charcot foot, foot  drop or prominent metatarsal  heads.     Left foot: Normal range of motion. No deformity, bunion, Charcot foot, foot drop or prominent metatarsal heads.  Feet:     Right foot:     Protective Sensation: 4 sites tested.  6 sites sensed.     Skin integrity: Skin integrity normal.     Toenail Condition: Right toenails are normal.     Left foot:     Protective Sensation: 5 sites tested.  6 sites sensed.     Skin integrity: Skin integrity normal.     Toenail Condition: Left toenails are normal.  Skin:    General: Skin is warm and dry.     Findings: Laceration present.     Comments: One large scab on left elbow and moderate sized skin tear on right elbow without signs of infection.  Neurological:     Mental Status: She is alert. Mental status is at baseline.  Psychiatric:        Mood and Affect: Affect is flat.        Speech: Speech is delayed.        Behavior: Behavior normal.        Thought Content: Thought content normal.     No results found for any visits on 09/10/22.   Last CBC Lab Results  Component Value Date   WBC 6.1 05/31/2022   HGB 13.9 05/31/2022   HCT 41.7 05/31/2022   MCV 93.5 05/31/2022   MCH 31.2 05/31/2022   RDW 14.1 05/31/2022   PLT 203 [id number]   Last metabolic panel Lab Results  Component Value Date   GLUCOSE 140 (H) 08/05/2022   NA 142 08/05/2022   K 3.5 08/05/2022   CL 102 08/05/2022   CO2 31 08/05/2022   BUN 19 08/05/2022   CREATININE 0.81 08/05/2022   GFRNONAA >60 05/31/2022   CALCIUM 9.8 08/05/2022   PHOS 2.3 (L) 08/22/2018   PROT 7.4 04/16/2022   ALBUMIN 3.9 04/16/2022   BILITOT 0.5 04/16/2022   ALKPHOS 96 04/16/2022   AST 30 04/16/2022   ALT 34 04/16/2022   ANIONGAP 8 05/31/2022   Last lipids Lab Results  Component Value Date   CHOL 123 02/13/2022   HDL 73 02/13/2022   LDLCALC 35 02/13/2022   TRIG 69 02/13/2022   CHOLHDL 1.7 02/13/2022   Last hemoglobin A1c Lab Results  Component Value Date   HGBA1C 7.8 (A) 05/29/2022   Last thyroid functions Lab  Results  Component Value Date   TSH 1.218 04/16/2022   Last vitamin D No results found for: "25OHVITD2", "25OHVITD3", "VD25OH" Last vitamin B12 and Folate Lab Results  Component Value Date   [id number] 02/20/2022      The ASCVD Risk score (Arnett DK, et al., 2019) failed to calculate for the following reasons:   The patient has a prior MI or stroke diagnosis    Assessment & Plan:   1. Type 2 diabetes mellitus without complication, without long-term current use of insulin ([place]): A1c today elevated at 7.8%. Patient states diet has not changed. Continue Metformin 1000 mg BID, add Rybelsus 3 mg daily. Follow up in 2 weeks. Diabetic foot exam today.   - POCT HgB A1C - HM Diabetes Foot Exam - Semaglutide (RYBELSUS) 3 MG TABS; Take 3 mg by mouth daily.  Dispense: 30 tablet; Refill: 1  2. Fall, initial encounter: Patient feeling light headed occasionally at home, leading to falls. States she is on 4-5 blood pressure medications but they  are not on the list here and she does not know what they are. We discussed patient needs to bring her medications with her at her follow up in 2 weeks. I suspect polypharmacy - patient is on Valium twice daily with slow reactions. May require referral to pharmacy for medication review. Blood pressure stable here but could be having orthostatic changes, recommend monitoring blood pressure at home. DME referral placed at [facility] for cane to help reduce fall risk.   3. Wheezing: Refilled Breztri inhaler today.   - BREZTRI AEROSPHERE 160-9-4.8 MCG/ACT AERO; Inhale 2 puffs into the lungs 2 (two) times daily.  Dispense: 5.9 g; Refill: 3  4. Need for influenza vaccination: Flu vaccine administered today.   - Flu Vaccine QUAD 6+ mos PF IM (Fluarix Quad PF)  No follow-ups on file.    Teodora Medici, DO

## 2022-09-10 ENCOUNTER — Encounter: Payer: Self-pay | Admitting: Internal Medicine

## 2022-09-10 ENCOUNTER — Other Ambulatory Visit: Payer: Self-pay

## 2022-09-10 ENCOUNTER — Ambulatory Visit (INDEPENDENT_AMBULATORY_CARE_PROVIDER_SITE_OTHER): Payer: Medicaid Other | Admitting: Internal Medicine

## 2022-09-10 VITALS — BP 128/74 | HR 100 | Temp 97.8°F | Resp 16 | Ht 62.0 in | Wt 196.4 lb

## 2022-09-10 DIAGNOSIS — Z7689 Persons encountering health services in other specified circumstances: Secondary | ICD-10-CM | POA: Diagnosis not present

## 2022-09-10 DIAGNOSIS — J439 Emphysema, unspecified: Secondary | ICD-10-CM | POA: Diagnosis not present

## 2022-09-10 DIAGNOSIS — K219 Gastro-esophageal reflux disease without esophagitis: Secondary | ICD-10-CM

## 2022-09-10 DIAGNOSIS — M7989 Other specified soft tissue disorders: Secondary | ICD-10-CM | POA: Diagnosis not present

## 2022-09-10 DIAGNOSIS — E1165 Type 2 diabetes mellitus with hyperglycemia: Secondary | ICD-10-CM

## 2022-09-10 LAB — POCT GLYCOSYLATED HEMOGLOBIN (HGB A1C): Hemoglobin A1C: 7.2 % — AB (ref 4.0–5.6)

## 2022-09-10 NOTE — Patient Instructions (Addendum)
It was great seeing you today!  Plan discussed at today's visit: -A1c decreased to 7.2% which is excellent -Let me know if you are currently taking the Actos, if so continue  -Remember to take Lasix only as needed and only take potassium with the lasix   Follow up in: 3 months   Take care and let us know if you have any questions or concerns prior to your next visit.  Dr. Rosana Berger

## 2022-09-13 ENCOUNTER — Other Ambulatory Visit: Payer: Medicaid Other | Admitting: *Deleted

## 2022-09-13 NOTE — Patient Instructions (Signed)
Visit Information  [name]  - as a part of your Medicaid benefit, you are eligible for care management and care coordination services at no cost or copay. I was unable to reach you by phone today but would be happy to help you with your health related needs. Please feel free to call me @ [phone].   A member of the Managed Medicaid care management team will reach out to you again over the next 14 days.   Lurena Joiner RN, BSN [place]  [employer] manager

## 2022-09-13 NOTE — Patient Outreach (Signed)
  Medicaid Managed Care   Unsuccessful Attempt Note   09/13/2022 Name: Stephanie [name] MRN: [id number] DOB: 09-06-1966  Referred by: Teodora Medici, DO Reason for referral : High Risk Managed Medicaid (Unsuccessful RNCM follow up telephone outreach)   An unsuccessful telephone outreach was attempted today. The patient was referred to the case management team for assistance with care management and care coordination.    Follow Up Plan: A HIPAA compliant phone message was left for the patient providing contact information and requesting a return call. and The Managed Medicaid care management team will reach out to the patient again over the next 14 days.    Lurena Joiner RN, BSN [place]  [employer] manager

## 2022-09-14 DIAGNOSIS — R300 Dysuria: Secondary | ICD-10-CM | POA: Diagnosis not present

## 2022-09-14 DIAGNOSIS — N390 Urinary tract infection, site not specified: Secondary | ICD-10-CM | POA: Diagnosis not present

## 2022-09-14 DIAGNOSIS — G43909 Migraine, unspecified, not intractable, without status migrainosus: Secondary | ICD-10-CM | POA: Diagnosis not present

## 2022-09-15 ENCOUNTER — Emergency Department (HOSPITAL_COMMUNITY)
Admission: EM | Admit: 2022-09-15 | Discharge: 2022-09-15 | Disposition: A | Discharge status: Home / Self Care | Payer: Medicaid Other | Attending: Emergency Medicine | Admitting: Emergency Medicine

## 2022-09-15 ENCOUNTER — Other Ambulatory Visit: Payer: Self-pay

## 2022-09-15 ENCOUNTER — Emergency Department (HOSPITAL_COMMUNITY): Payer: Medicaid Other

## 2022-09-15 DIAGNOSIS — G43909 Migraine, unspecified, not intractable, without status migrainosus: Secondary | ICD-10-CM | POA: Diagnosis not present

## 2022-09-15 DIAGNOSIS — I10 Essential (primary) hypertension: Secondary | ICD-10-CM | POA: Insufficient documentation

## 2022-09-15 DIAGNOSIS — R519 Headache, unspecified: Secondary | ICD-10-CM | POA: Diagnosis not present

## 2022-09-15 DIAGNOSIS — E119 Type 2 diabetes mellitus without complications: Secondary | ICD-10-CM | POA: Insufficient documentation

## 2022-09-15 DIAGNOSIS — Z7902 Long term (current) use of antithrombotics/antiplatelets: Secondary | ICD-10-CM | POA: Diagnosis not present

## 2022-09-15 DIAGNOSIS — Z7984 Long term (current) use of oral hypoglycemic drugs: Secondary | ICD-10-CM | POA: Diagnosis not present

## 2022-09-15 LAB — ETHANOL: Alcohol, Ethyl (B): <10 mg/dL (ref <10)

## 2022-09-15 LAB — COMPREHENSIVE METABOLIC PANEL
ALT: 28 U/L (ref 0–44)
AST: 26 U/L (ref 15–41)
Albumin: 3.5 g/dL (ref 3.5–5.0)
Alkaline Phosphatase: 72 U/L (ref 38–126)
Anion gap: 8 (ref 5–15)
BUN: 14 mg/dL (ref 6–20)
CO2: 27 mmol/L (ref 22–32)
Calcium: 9.5 mg/dL (ref 8.9–10.3)
Chloride: 107 mmol/L (ref 98–111)
Creatinine, Ser: 0.79 mg/dL (ref 0.44–1.00)
GFR, Estimated: >60 mL/min (ref >60)
Glucose, Bld: 123 mg/dL — ABNORMAL HIGH (ref 70–99)
Potassium: 4 mmol/L (ref 3.5–5.1)
Sodium: 142 mmol/L (ref 135–145)
Total Bilirubin: 0.2 mg/dL — ABNORMAL LOW (ref 0.3–1.2)
Total Protein: 7.1 g/dL (ref 6.5–8.1)

## 2022-09-15 LAB — CBC WITH DIFFERENTIAL/PLATELET
Abs Immature Granulocytes: 0.02 10*3/uL (ref 0.00–0.07)
Basophils Absolute: 0 10*3/uL (ref 0.0–0.1)
Basophils Relative: 1 %
Eosinophils Absolute: 0.1 10*3/uL (ref 0.0–0.5)
Eosinophils Relative: 2 %
HCT: 42.4 % (ref 36.0–46.0)
Hemoglobin: 13.1 g/dL (ref 12.0–15.0)
Immature Granulocytes: 0 %
Lymphocytes Relative: 47 %
Lymphs Abs: 2.3 10*3/uL (ref 0.7–4.0)
MCH: 30.6 pg (ref 26.0–34.0)
MCHC: 30.9 g/dL (ref 30.0–36.0)
MCV: 99.1 fL (ref 80.0–100.0)
Monocytes Absolute: 0.5 10*3/uL (ref 0.1–1.0)
Monocytes Relative: 11 %
Neutro Abs: 1.9 10*3/uL (ref 1.7–7.7)
Neutrophils Relative %: 39 %
Platelets: 169 10*3/uL (ref 150–400)
RBC: 4.28 MIL/uL (ref 3.87–5.11)
RDW: 13.9 % (ref 11.5–15.5)
WBC: 4.9 10*3/uL (ref 4.0–10.5)
nRBC: 0 % (ref 0.0–0.2)

## 2022-09-15 LAB — CBG MONITORING, ED: Glucose-Capillary: 128 mg/dL — ABNORMAL HIGH (ref 70–99)

## 2022-09-15 LAB — APTT: aPTT: 30 seconds (ref 24–36)

## 2022-09-15 LAB — PROTIME-INR
INR: 0.9 (ref 0.8–1.2)
Prothrombin Time: 12.3 seconds (ref 11.4–15.2)

## 2022-09-15 MED ORDER — DIPHENHYDRAMINE HCL 50 MG/ML IJ SOLN
25.0000 mg | Freq: Once | INTRAMUSCULAR | Status: AC
  Administered 2022-09-15: 25 mg via INTRAVENOUS
  Filled 2022-09-15: qty 1

## 2022-09-15 MED ORDER — HYDROMORPHONE HCL 1 MG/ML IJ SOLN
0.5000 mg | Freq: Once | INTRAMUSCULAR | Status: AC
  Administered 2022-09-15: 0.5 mg via INTRAVENOUS
  Filled 2022-09-15: qty 1

## 2022-09-15 MED ORDER — SODIUM CHLORIDE 0.9 % IV BOLUS
500.0000 mL | Freq: Once | INTRAVENOUS | Status: AC
  Administered 2022-09-15: 500 mL via INTRAVENOUS

## 2022-09-15 NOTE — ED Provider Notes (Signed)
MSE was initiated and I personally evaluated the patient and placed orders (if any) at  5:56 AM on September 15, 2022.  The patient appears stable so that the remainder of the MSE may be completed by another provider.  Patient awake and alert.  She keeps her eyes closed due to photophobia.  Pupils are equal and reactive to light.  She has noted to move all extremities.  Head CT pending given patient's send history of intracranial hemorrhage.   Clarnce Homan, Jenny Reichmann, MD 09/15/22 [phone]

## 2022-09-15 NOTE — ED Triage Notes (Signed)
Pt presents for HA x 1 week, started suddenly. Was diagnosed with migraine at [facility] yesterday and was prescribed medication which minimally relieved pain. Pt also would like staff to know she has a "knot" on the back of her head that is soft and has been present for 2 weeks. Takes plavix.  Endorses intermittent nausea, dizziness, vision change in R eye. Denies weakness

## 2022-09-15 NOTE — ED Provider Notes (Signed)
[facility]-EMERGENCY DEPT Provider Note   CSN: [id number] Arrival date & time: 09/15/22  0534     History  Chief Complaint  Patient presents with   Headache    Stephanie [name] is a 57 y.o. female.  With past medical history significant for stroke, intracranial hemorrhage, diabetes, hypertension who presents to the emergency department with headache.  States she began having headache on Tuesday. She describes it as a sudden onset. States that it is primarily right sided and throbbing. This feels like her typical migraine pattern. She has associated right eye blurriness, photophobia/phonophobia and nausea with vomiting which is also typical of her headaches. She states she has been taking Advil at home without relief of symptoms. She denies neck pain or fever, head trauma, numbness or tingling to the face or extremities, weakness to extremities. Notably she has history of ICH and stroke with mild left facial droop, right sided weakness residual which has been unchanged.   Headache Associated symptoms: photophobia        Home Medications Prior to Admission medications   Medication Sig Start Date End Date Taking? Authorizing Provider  albuterol (VENTOLIN HFA) 108 (90 Base) MCG/ACT inhaler Inhale 2 puffs into the lungs every 6 (six) hours as needed for wheezing or shortness of breath. 02/13/22   Caro Laroche, DO  ARIPiprazole (ABILIFY) 15 MG tablet Take 15 mg by mouth every morning. 06/10/21   [provider]  blood glucose meter kit and supplies Dispense based on patient and insurance preference. Use up to four times daily as directed. (FOR ICD-10 E10.9, E11.9). Patient not taking: Reported on 09/10/2022 07/30/22   Margarita Mail, DO  CAPLYTA 42 MG capsule Take 42 mg by mouth every evening. 06/08/21   [provider]  clopidogrel (PLAVIX) 75 MG tablet Take 75 mg by mouth daily. 02/25/22   [provider]  diazepam (VALIUM) 10 MG tablet  Take 10 mg by mouth 3 (three) times daily. 03/27/22   [provider]  fluticasone-salmeterol (ADVAIR) 100-50 MCG/ACT AEPB Inhale 1 puff into the lungs 2 (two) times daily. 08/05/22   Margarita Mail, DO  furosemide (LASIX) 40 MG tablet Take 1 tablet (40 mg total) by mouth 2 (two) times daily as needed for edema (take 40 mg in the morning, can take additional 40 mg in afternoon if needed for edema). 04/30/22   Berniece Salines, FNP  lamoTRIgine (LAMICTAL) 100 MG tablet Take 100 mg by mouth 2 (two) times daily.    [provider]  metFORMIN (GLUCOPHAGE-XR) 500 MG 24 hr tablet Take 1 tablet (500 mg total) by mouth 2 (two) times daily with a meal. 06/12/22   Margarita Mail, DO  omeprazole (PRILOSEC) 40 MG capsule Take 1 capsule (40 mg total) by mouth daily. 06/06/22   Margarita Mail, DO  pioglitazone (ACTOS) 15 MG tablet Take 1 tablet (15 mg total) by mouth daily. 06/26/22   Berniece Salines, FNP  potassium chloride SA (KLOR-CON M) 20 MEQ tablet Take 20 mEq two times a day for three days, then once a day with lasix 06/26/22   Berniece Salines, FNP  prazosin (MINIPRESS) 2 MG capsule Take 4 mg by mouth every evening. 02/15/21   [provider]  traZODone (DESYREL) 150 MG tablet Take by mouth at bedtime.    [provider]  Vilazodone HCl (VIIBRYD) 40 MG TABS Take 40 mg by mouth daily.    [provider]      Allergies  Gabapentin, Ibuprofen, Sulfa antibiotics, Tylenol [acetaminophen], Aspirin, Buprenorphine hcl, Carbamazepine, Codeine, Compazine, Elemental sulfur, Ioxaglate, Ivp dye [iodinated contrast media], Metrizamide, Naproxen, Norco [hydrocodone-acetaminophen], Other, Penicillin g, Prochlorperazine maleate, Reglan [metoclopramide], Toradol [ketorolac tromethamine], Tramadol, and Zofran [ondansetron hcl]    Review of Systems   Review of Systems  Eyes:  Positive for photophobia and visual disturbance.  Neurological:  Positive for headaches.  All  other systems reviewed and are negative.   Physical Exam Updated Vital Signs BP 106/87   Pulse 64   Temp (!) 97.4 F (36.3 C) (Oral)   Resp 15   Ht 5\' 2"  (1.575 m)   Wt 88.5 kg   SpO2 95%   BMI 35.67 kg/m  Physical Exam Vitals and nursing note reviewed.  Constitutional:      General: She is not in acute distress.    Appearance: Normal appearance. She is well-developed. She is obese. She is ill-appearing.  HENT:     Head: Normocephalic and atraumatic.  Eyes:     General: No scleral icterus.    Extraocular Movements: Extraocular movements intact.     Right eye: Normal extraocular motion.     Left eye: Normal extraocular motion.     Pupils: Pupils are equal, round, and reactive to light.  Cardiovascular:     Rate and Rhythm: Normal rate and regular rhythm.     Heart sounds: Normal heart sounds. No murmur heard. Pulmonary:     Effort: Pulmonary effort is normal. No respiratory distress.     Breath sounds: Normal breath sounds.  Abdominal:     General: There is no distension.     Palpations: Abdomen is soft.  Musculoskeletal:     Cervical back: Normal range of motion and neck supple.  Skin:    General: Skin is warm and dry.     Capillary Refill: Capillary refill takes less than 2 seconds.  Neurological:     General: No focal deficit present.     Mental Status: She is alert and oriented to person, place, and time. Mental status is at baseline.     Sensory: Sensation is intact.     Motor: Weakness present.     Comments: Mild right sided residual weakness   Psychiatric:        Mood and Affect: Mood normal.        Behavior: Behavior normal.        Thought Content: Thought content normal.        Judgment: Judgment normal.     ED Results / Procedures / Treatments   Labs (all labs ordered are listed, but only abnormal results are displayed) Labs Reviewed  COMPREHENSIVE METABOLIC PANEL - Abnormal; Notable for the following components:      Result Value   Glucose, Bld  123 (*)    Total Bilirubin 0.2 (*)    All other components within normal limits  CBG MONITORING, ED - Abnormal; Notable for the following components:   Glucose-Capillary 128 (*)    All other components within normal limits  PROTIME-INR  APTT  ETHANOL  CBC WITH DIFFERENTIAL/PLATELET    EKG EKG Interpretation  Date/Time:  Sunday September 15 2022 06:34:00 EST Ventricular Rate:  63 PR Interval:  195 QRS Duration: 97 QT Interval:  397 QTC Calculation: 407 R Axis:   -19 Text Interpretation: Sinus rhythm Borderline left axis deviation Low voltage, precordial leads Probable anteroseptal infarct, old Rate is slower Confirmed by Shanon Rosser [phone]) on 09/15/2022 6:47:16 AM  Radiology CT HEAD WO  CONTRAST  Result Date: 09/15/2022 CLINICAL DATA:  Headache, new onset. Sudden headache for a week. History of intracranial hemorrhage EXAM: CT HEAD WITHOUT CONTRAST TECHNIQUE: Contiguous axial images were obtained from the base of the skull through the vertex without intravenous contrast. RADIATION DOSE REDUCTION: This exam was performed according to the departmental dose-optimization program which includes automated exposure control, adjustment of the mA and/or kV according to patient size and/or use of iterative reconstruction technique. COMPARISON:  05/31/2022 FINDINGS: Brain: No evidence of acute infarction, hemorrhage, hydrocephalus, extra-axial collection or mass lesion/mass effect. Chronic left cerebral cortex infarct spanning the temporal and occipital cortex. Vascular: No hyperdense vessel or unexpected calcification. Skull: Normal. Negative for fracture or focal lesion. Sinuses/Orbits: No acute finding. IMPRESSION: 1. No acute finding. 2. Remote left temporal occipital infarct. Electronically Signed   By: Tiburcio Pea M.D.   On: 09/15/2022 06:26    Procedures Procedures    Medications Ordered in ED Medications  HYDROmorphone (DILAUDID) injection 0.5 mg (0.5 mg Intravenous Given 09/15/22 0714)   diphenhydrAMINE (BENADRYL) injection 25 mg (25 mg Intravenous Given 09/15/22 0714)  sodium chloride 0.9 % bolus 500 mL (0 mLs Intravenous Stopped 09/15/22 0742)  HYDROmorphone (DILAUDID) injection 0.5 mg (0.5 mg Intravenous Given 09/15/22 7829)    ED Course/ Medical Decision Making/ A&P                           Medical Decision Making Amount and/or Complexity of Data Reviewed Labs: ordered. Radiology: ordered.  Risk Prescription drug management.  Initial Impression and Ddx 57 year old female who presents to the emergency department with headache.  She has some chronic weakness and deficits related to previous stroke without new focal findings.  CT head was ordered in triage.  Will obtain lab work, migraine cocktail. Patient PMH that increases complexity of ED encounter: Migraine, intracranial hemorrhage, stroke, hypertension, diabetes Differential: tension headache, migraine, meningitis, stroke, encephalitis, intracranial bleed, temporal arteritis, acute angle closure glaucoma, carotid artery dissection, idiopathic intracranial hypertension, mass, CNS infection, etc.   Interpretation of Diagnostics I independent reviewed and interpreted the labs as followed: CBC within normal limits, CMP with mildly elevated glucose  - I independently visualized the following imaging with scope of interpretation limited to determining acute life threatening conditions related to emergency care: CT head, which revealed no acute findings  Patient Reassessment and Ultimate Disposition/Management On my assessment this is a 57 year old female who presents with headache. Her symptoms are most consistent with migraine type headache. There are no headache red flags on her exam.  Her neurological exam was without evidence of meningismus, altered mental status or focal findings so doubt meningitis, encephalitis, stroke.  Her presentation is not consistent with an acute intracranial bleed.  We also obtain a CT head  given her significant history which is negative for any new stroke or bleed at this time. There is no history of trauma or neck trauma or neck strain concerning for carotid dissection. Given her history and physical doubt temporal arteritis and acute angle-closure glaucoma. Symptoms are also inconsistent with idiopathic ICH.  Imaging without evidence of tumor or abscess.  She was given a migraine cocktail which is limited based on her significant allergies.  She did have relief of symptoms.  He has follow-up with her primary care provider this week.  She also complained of having this "softness" and pain to the back of her head that is new over the past few weeks.  I examined the posterior  scalp and did not see any mass, wound, rash.  Will have her follow-up with primary care provider regarding this as well.  At this time I feel that she is appropriate for discharge with return precautions.  The patient has been appropriately medically screened and/or stabilized in the ED. I have low suspicion for any other emergent medical condition which would require further screening, evaluation or treatment in the ED or require inpatient management. At time of discharge the patient is hemodynamically stable and in no acute distress. I have discussed work-up results and diagnosis with patient and answered all questions. Patient is agreeable with discharge plan. We discussed strict return precautions for returning to the emergency department and they verbalized understanding.     Patient management required discussion with the following services or consulting groups:  None  Complexity of Problems Addressed Chronic illness with exacerbation  Additional Data Reviewed and Analyzed Further history obtained from: Further history from spouse/family member, Past medical history and medications listed in the EMR, Care Everywhere, and Prior labs/imaging results  Patient Encounter Risk Assessment None  Final Clinical  Impression(s) / ED Diagnoses Final diagnoses:  Migraine without status migrainosus, not intractable, unspecified migraine type    Rx / DC Orders ED Discharge Orders     None         Cristopher Peru, PA-C 09/15/22 2951    Alvira Monday, MD 09/16/22 1435

## 2022-09-15 NOTE — Discharge Instructions (Signed)
You were seen in the emergency department today for migraine.  Your CT of your head as well as your labs are all reassuring.  Please follow-up with your primary care provider this week.  Please return to the emergency department for worsening symptoms.

## 2022-09-15 NOTE — ED Notes (Signed)
Patient asked for some cranberry juice which she was provided. She also stated that her headache is better in the front part of her head, but not the back part.

## 2022-09-16 ENCOUNTER — Ambulatory Visit: Payer: Self-pay

## 2022-09-16 DIAGNOSIS — Z886 Allergy status to analgesic agent status: Secondary | ICD-10-CM | POA: Diagnosis not present

## 2022-09-16 DIAGNOSIS — Z888 Allergy status to other drugs, medicaments and biological substances status: Secondary | ICD-10-CM | POA: Diagnosis not present

## 2022-09-16 DIAGNOSIS — Z885 Allergy status to narcotic agent status: Secondary | ICD-10-CM | POA: Diagnosis not present

## 2022-09-16 DIAGNOSIS — F32A Depression, unspecified: Secondary | ICD-10-CM | POA: Diagnosis not present

## 2022-09-16 DIAGNOSIS — F419 Anxiety disorder, unspecified: Secondary | ICD-10-CM | POA: Diagnosis not present

## 2022-09-16 DIAGNOSIS — G9389 Other specified disorders of brain: Secondary | ICD-10-CM | POA: Diagnosis not present

## 2022-09-16 DIAGNOSIS — Z79899 Other long term (current) drug therapy: Secondary | ICD-10-CM | POA: Diagnosis not present

## 2022-09-16 DIAGNOSIS — G588 Other specified mononeuropathies: Secondary | ICD-10-CM | POA: Diagnosis not present

## 2022-09-16 DIAGNOSIS — R9089 Other abnormal findings on diagnostic imaging of central nervous system: Secondary | ICD-10-CM | POA: Diagnosis not present

## 2022-09-16 DIAGNOSIS — Z7902 Long term (current) use of antithrombotics/antiplatelets: Secondary | ICD-10-CM | POA: Diagnosis not present

## 2022-09-16 DIAGNOSIS — Z8673 Personal history of transient ischemic attack (TIA), and cerebral infarction without residual deficits: Secondary | ICD-10-CM | POA: Diagnosis not present

## 2022-09-16 DIAGNOSIS — Z880 Allergy status to penicillin: Secondary | ICD-10-CM | POA: Diagnosis not present

## 2022-09-16 DIAGNOSIS — Z87891 Personal history of nicotine dependence: Secondary | ICD-10-CM | POA: Diagnosis not present

## 2022-09-16 DIAGNOSIS — M5481 Occipital neuralgia: Secondary | ICD-10-CM | POA: Diagnosis not present

## 2022-09-16 DIAGNOSIS — Z882 Allergy status to sulfonamides status: Secondary | ICD-10-CM | POA: Diagnosis not present

## 2022-09-16 NOTE — Telephone Encounter (Signed)
  Chief Complaint: HA Symptoms: HA on R side front and back, 10/10, knot of back of head Frequency: 1 week  Pertinent Negatives: NA Disposition: [] ED /[] Urgent Care (no appt availability in office) / [x] Appointment(In office/virtual)/ []  [facility]/ [] Home Care/ [] Refused Recommended Disposition /[] [facility]/ []  Follow-up with PCP Additional Notes: pt went to ED last night for HA, was given medication there and sent home with Fioricet. Pt states meds helped but when she woke up HA was bad again. Scheduled FU appt for tomorrow at 1520 and advised if sx get worse prior to appt to call back. Pt verbalized understanding.   Reason for Disposition . [1] SEVERE headache (e.g., excruciating) AND [2] not improved after 2 hours of pain medicine  Answer Assessment - Initial Assessment Questions 1. LOCATION: "Where does it hurt?"      R side front and back  2. ONSET: "When did the headache start?" (Minutes, hours or days)      1 week  3. PATTERN: "Does the pain come and go, or has it been constant since it started?"     Constant  4. SEVERITY: "How bad is the pain?" and "What does it keep you from doing?"  (e.g., Scale 1-10; mild, moderate, or severe)   - MILD (1-3): doesn't interfere with normal activities    - MODERATE (4-7): interferes with normal activities or awakens from sleep    - SEVERE (8-10): excruciating pain, unable to do any normal activities        10 7. MIGRAINE: "Have you been diagnosed with migraine headaches?" If Yes, ask: "Is this headache similar?"      yes 8. HEAD INJURY: "Has there been any recent injury to the head?"      no 9. OTHER SYMPTOMS: "Do you have any other symptoms?" (fever, stiff neck, eye pain, sore throat, cold symptoms)     Knot on back of head  Protocols used: Headache-A-AH

## 2022-09-16 NOTE — Progress Notes (Deleted)
   Acute Office Visit  Subjective:     [name]: Stephanie [name], female    DOB: Oct 12, 1965, 57 y.o.   MRN: [id number]  No chief complaint on file.   HPI Patient is in today for ER follow up.   Discharge Date: 09/15/22 Diagnosis: migraine Procedures/tests: CT Head without anything acute, remote left temporal occipital infarct, EKG sinus rhythm. Sodium 146, chloride 112. Urine with large leukocytes, blood, protein and glucose   Consultants: None New medications: None ? Migraine cocktail  Status: {Blank multiple:19196::"better","worse","stable","fluctuating"}   ROS      Objective:    There were no vitals taken for this visit. {Vitals History (Optional):23777}  Physical Exam  No results found for any visits on 09/17/22.      Assessment & Plan:   Problem List Items Addressed This Visit   None   No orders of the defined types were placed in this encounter.   No follow-ups on file.  Teodora Medici, DO

## 2022-09-17 ENCOUNTER — Ambulatory Visit: Payer: Medicaid Other | Admitting: Internal Medicine

## 2022-09-17 ENCOUNTER — Encounter: Payer: Medicaid Other | Admitting: Primary Care

## 2022-09-17 ENCOUNTER — Telehealth: Payer: Self-pay | Admitting: Primary Care

## 2022-09-17 NOTE — Telephone Encounter (Signed)
LDCT has been canceled.  Pt for rescheduling of sdmv and ldct

## 2022-09-17 NOTE — Telephone Encounter (Signed)
Patient had shared decision visit today scheduled for 1:30pm. I called phone number listed twice and left two messaged. CT is schedule for tomorrow- will need to reschedule if she does not call back in the next 15 mins  I called home phone number and family member answered. She was not available. They tell me she went to ED last night for migraine headache, she did not get home until 4am and family member said she probably was laying down.

## 2022-09-18 ENCOUNTER — Ambulatory Visit: Admission: RE | Admit: 2022-09-18 | Payer: Medicaid Other | Source: Ambulatory Visit

## 2022-09-19 DIAGNOSIS — Z880 Allergy status to penicillin: Secondary | ICD-10-CM | POA: Diagnosis not present

## 2022-09-19 DIAGNOSIS — I10 Essential (primary) hypertension: Secondary | ICD-10-CM | POA: Diagnosis not present

## 2022-09-19 DIAGNOSIS — E119 Type 2 diabetes mellitus without complications: Secondary | ICD-10-CM | POA: Diagnosis not present

## 2022-09-19 DIAGNOSIS — M7918 Myalgia, other site: Secondary | ICD-10-CM | POA: Diagnosis not present

## 2022-09-19 DIAGNOSIS — Z886 Allergy status to analgesic agent status: Secondary | ICD-10-CM | POA: Diagnosis not present

## 2022-09-19 DIAGNOSIS — M25551 Pain in right hip: Secondary | ICD-10-CM | POA: Diagnosis not present

## 2022-09-19 DIAGNOSIS — Z885 Allergy status to narcotic agent status: Secondary | ICD-10-CM | POA: Diagnosis not present

## 2022-09-19 DIAGNOSIS — Z882 Allergy status to sulfonamides status: Secondary | ICD-10-CM | POA: Diagnosis not present

## 2022-09-19 DIAGNOSIS — K2100 Gastro-esophageal reflux disease with esophagitis, without bleeding: Secondary | ICD-10-CM | POA: Diagnosis not present

## 2022-09-19 DIAGNOSIS — M5126 Other intervertebral disc displacement, lumbar region: Secondary | ICD-10-CM | POA: Diagnosis not present

## 2022-09-19 DIAGNOSIS — M5136 Other intervertebral disc degeneration, lumbar region: Secondary | ICD-10-CM | POA: Diagnosis not present

## 2022-09-19 DIAGNOSIS — M5137 Other intervertebral disc degeneration, lumbosacral region: Secondary | ICD-10-CM | POA: Diagnosis not present

## 2022-09-19 DIAGNOSIS — N200 Calculus of kidney: Secondary | ICD-10-CM | POA: Diagnosis not present

## 2022-09-19 DIAGNOSIS — M7912 Myalgia of auxiliary muscles, head and neck: Secondary | ICD-10-CM | POA: Diagnosis not present

## 2022-09-21 ENCOUNTER — Other Ambulatory Visit: Payer: Self-pay

## 2022-09-21 ENCOUNTER — Emergency Department (HOSPITAL_COMMUNITY)
Admission: EM | Admit: 2022-09-21 | Discharge: 2022-09-21 | Disposition: A | Discharge status: Home / Self Care | Payer: Medicaid Other | Attending: Emergency Medicine | Admitting: Emergency Medicine

## 2022-09-21 DIAGNOSIS — E119 Type 2 diabetes mellitus without complications: Secondary | ICD-10-CM | POA: Insufficient documentation

## 2022-09-21 DIAGNOSIS — G43811 Other migraine, intractable, with status migrainosus: Secondary | ICD-10-CM | POA: Diagnosis not present

## 2022-09-21 DIAGNOSIS — I10 Essential (primary) hypertension: Secondary | ICD-10-CM | POA: Diagnosis not present

## 2022-09-21 DIAGNOSIS — R519 Headache, unspecified: Secondary | ICD-10-CM | POA: Diagnosis present

## 2022-09-21 DIAGNOSIS — Z79899 Other long term (current) drug therapy: Secondary | ICD-10-CM | POA: Insufficient documentation

## 2022-09-21 DIAGNOSIS — G43809 Other migraine, not intractable, without status migrainosus: Secondary | ICD-10-CM | POA: Diagnosis not present

## 2022-09-21 DIAGNOSIS — Z7984 Long term (current) use of oral hypoglycemic drugs: Secondary | ICD-10-CM | POA: Diagnosis not present

## 2022-09-21 MED ORDER — MAGNESIUM SULFATE 2 GM/50ML IV SOLN
2.0000 g | Freq: Once | INTRAVENOUS | Status: AC
  Administered 2022-09-21: 2 g via INTRAVENOUS
  Filled 2022-09-21: qty 50

## 2022-09-21 MED ORDER — DIPHENHYDRAMINE HCL 50 MG/ML IJ SOLN
12.5000 mg | Freq: Once | INTRAMUSCULAR | Status: AC
  Administered 2022-09-21: 12.5 mg via INTRAVENOUS
  Filled 2022-09-21: qty 1

## 2022-09-21 MED ORDER — HYDROMORPHONE HCL 1 MG/ML IJ SOLN
1.0000 mg | Freq: Once | INTRAMUSCULAR | Status: AC
  Administered 2022-09-21: 1 mg via INTRAVENOUS
  Filled 2022-09-21: qty 1

## 2022-09-21 MED ORDER — LACTATED RINGERS IV BOLUS
500.0000 mL | Freq: Once | INTRAVENOUS | Status: AC
  Administered 2022-09-21: 500 mL via INTRAVENOUS

## 2022-09-21 NOTE — ED Triage Notes (Signed)
R sided HA x 10days. Seen in ER for same on 09/15/21, has had no change since this visit.  Was seen by spine specialist who tried a trigger point injection.  Continues to endorse nausea and vision changes unchanged from last visit.

## 2022-09-21 NOTE — ED Notes (Signed)
Pt ambulated to restroom without assistance.

## 2022-09-21 NOTE — ED Provider Notes (Signed)
Stephanie [name]   CSN: [id number] Arrival date & time: 09/21/22  0416     History  Chief Complaint  Patient presents with   Migraine    Stephanie [name] is a 57 y.o. female.  Patient is a 57 year old female with a history of diabetes, chronic migraines on Ajovy, and hypertension who is presenting today with recurrent headache.  She reports this headache has been going on for for 5 days but was seen here approximately 10 days ago and also had to get medication for her headache.  She reports that initially the headache was gone and she was okay for 3 to 4 days and then it started again.  This feels like her typical headaches is on the right side of her head makes her vision blurry and sometimes causes her difficulty walking.  She has not had fever, cough, congestion, chest pain or abdominal pain.  She has had intermittent nausea and occasional vomiting.  She did see her doctor yesterday and had an occipital injection in her neck but reports it has not changed the headache type pain.  She reports she is due for an injection for her migraine next week and usually it only lasts a few weeks and then she gets breakthrough headaches.  She has tried Tylenol, Aleve and Advil at home without improvement.  She does not have any abortive therapies at home due to her multiple allergies.  She reports she is here today just to get better pain control.  She does not feel there is anything different about her headache today than prior.  The history is provided by the patient and medical records.  Migraine       Home Medications Prior to Admission medications   Medication Sig Start Date End Date Taking? Authorizing Provider  albuterol (VENTOLIN HFA) 108 (90 Base) MCG/ACT inhaler Inhale 2 puffs into the lungs every 6 (six) hours as needed for wheezing or shortness of breath. 02/13/22   Caro Laroche, DO  ARIPiprazole (ABILIFY) 15 MG tablet Take 15 mg by mouth  every morning. 06/10/21   [provider]  blood glucose meter kit and supplies Dispense based on patient and insurance preference. Use up to four times daily as directed. (FOR ICD-10 E10.9, E11.9). Patient not taking: Reported on 09/10/2022 07/30/22   Margarita Mail, DO  CAPLYTA 42 MG capsule Take 42 mg by mouth every evening. 06/08/21   [provider]  clopidogrel (PLAVIX) 75 MG tablet Take 75 mg by mouth daily. 02/25/22   [provider]  diazepam (VALIUM) 10 MG tablet Take 10 mg by mouth 3 (three) times daily. 03/27/22   [provider]  fluticasone-salmeterol (ADVAIR) 100-50 MCG/ACT AEPB Inhale 1 puff into the lungs 2 (two) times daily. 08/05/22   Margarita Mail, DO  furosemide (LASIX) 40 MG tablet Take 1 tablet (40 mg total) by mouth 2 (two) times daily as needed for edema (take 40 mg in the morning, can take additional 40 mg in afternoon if needed for edema). 04/30/22   Berniece Salines, FNP  lamoTRIgine (LAMICTAL) 100 MG tablet Take 100 mg by mouth 2 (two) times daily.    [provider]  metFORMIN (GLUCOPHAGE-XR) 500 MG 24 hr tablet Take 1 tablet (500 mg total) by mouth 2 (two) times daily with a meal. 06/12/22   Margarita Mail, DO  omeprazole (PRILOSEC) 40 MG capsule Take 1 capsule (40 mg total) by mouth daily. 06/06/22   Margarita Mail, DO  pioglitazone (ACTOS) 15 MG tablet Take 1 tablet (15 mg total) by mouth daily. 06/26/22   Bo Merino, FNP  potassium chloride SA (KLOR-CON M) 20 MEQ tablet Take 20 mEq two times a day for three days, then once a day with lasix 06/26/22   Bo Merino, FNP  prazosin (MINIPRESS) 2 MG capsule Take 4 mg by mouth every evening. 02/15/21   [provider]  traZODone (DESYREL) 150 MG tablet Take by mouth at bedtime.    [provider]  Vilazodone HCl (VIIBRYD) 40 MG TABS Take 40 mg by mouth daily.    [provider]      Allergies    Gabapentin, Ibuprofen, Sulfa antibiotics,  Tylenol [acetaminophen], Aspirin, Buprenorphine hcl, Carbamazepine, Codeine, Compazine, Elemental sulfur, Ioxaglate, Ivp dye [iodinated contrast media], Metrizamide, Naproxen, Norco [hydrocodone-acetaminophen], Other, Penicillin g, Prochlorperazine maleate, Reglan [metoclopramide], Toradol [ketorolac tromethamine], Tramadol, and Zofran [ondansetron hcl]    Review of Systems   Review of Systems  Physical Exam Updated Vital Signs BP 120/73   Pulse 68   Temp 97.8 F (36.6 C) (Oral)   Resp 17   Ht 5\' 2"  (1.575 m)   Wt 88.5 kg   SpO2 90%   BMI 35.67 kg/m  Physical Exam Vitals and nursing [name] reviewed.  Constitutional:      General: She is not in acute distress.    Appearance: She is well-developed.  HENT:     Head: Normocephalic and atraumatic.  Eyes:     Extraocular Movements: Extraocular movements intact.     Pupils: Pupils are equal, round, and reactive to light.     Comments: Photophobia  Cardiovascular:     Rate and Rhythm: Normal rate and regular rhythm.     Heart sounds: Normal heart sounds. No murmur heard.    No friction rub.  Pulmonary:     Effort: Pulmonary effort is normal.     Breath sounds: Normal breath sounds. No wheezing or rales.  Abdominal:     General: Bowel sounds are normal. There is no distension.     Palpations: Abdomen is soft.     Tenderness: There is no abdominal tenderness. There is no guarding or rebound.  Musculoskeletal:        General: No tenderness. Normal range of motion.     Right lower leg: No edema.     Left lower leg: No edema.     Comments: No edema  Skin:    General: Skin is warm and dry.     Findings: No rash.  Neurological:     Mental Status: She is alert and oriented to person, place, and time. Mental status is at baseline.     Cranial Nerves: No cranial nerve deficit.     Sensory: No sensory deficit.     Motor: No weakness.     Coordination: Coordination normal.     Comments: Mild jerks noted in the hands and muscles around  the face but no notable weakness or coordination issues  Psychiatric:        Behavior: Behavior normal.     ED Results / Procedures / Treatments   Labs (all labs ordered are listed, but only abnormal results are displayed) Labs Reviewed - No data to display  EKG None  Radiology No results found.  Procedures Procedures    Medications Ordered in ED Medications  HYDROmorphone (DILAUDID) injection 1 mg (1 mg Intravenous Given 09/21/22 1048)  magnesium sulfate IVPB 2 g 50 mL (0 g Intravenous  Stopped 09/21/22 1151)  diphenhydrAMINE (BENADRYL) injection 12.5 mg (12.5 mg Intravenous Given 09/21/22 1046)  lactated ringers bolus 500 mL (0 mLs Intravenous Stopped 09/21/22 1252)  HYDROmorphone (DILAUDID) injection 1 mg (1 mg Intravenous Given 09/21/22 1221)    ED Course/ Medical Decision Making/ A&P                             Medical Decision Making Risk Prescription drug management.   Pt with multiple medical problems and comorbidities and presenting today with a complaint that caries a high risk for morbidity and mortality.  Here today with a severe migraine she reports the pain is a 9 out of 10.  Patient has seen multiple times in the past for migraines and is on an injectable therapy monthly.  Low suspicion for meningitis, subarachnoid hemorrhage, aneurysm or intracranial hypertension.  Lower suspicion for venous thrombosis at this time.  Patient had an MRI done in August that showed prior infarcts but nothing acute and had a CT scan done last week that was negative for acute issues.  At this time will attempt pain control and reassess.  She is noted to be hypertensive here without currently being on medications.  This is different than her recent trends in the ER and doctor's office.  Will reassess after medications. 2:21 PM After 2 rounds of pain medication patient is now feeling great and wants to go home.        Final Clinical Impression(s) / ED Diagnoses Final diagnoses:   Other migraine with status migrainosus, intractable    Rx / DC Orders ED Discharge Orders     None         Blanchie Dessert, MD 09/21/22 1421

## 2022-09-22 ENCOUNTER — Other Ambulatory Visit: Payer: Self-pay | Admitting: Nurse Practitioner

## 2022-09-22 DIAGNOSIS — E876 Hypokalemia: Secondary | ICD-10-CM

## 2022-09-23 NOTE — Telephone Encounter (Signed)
Requested Prescriptions  Pending Prescriptions Disp Refills   potassium chloride SA (KLOR-CON M20) 20 MEQ tablet [Pharmacy Med Name: Klor-Con M20 20 MEQ Oral Tablet Extended Release] 90 tablet 0    Sig: TAKE 1 TABLET BY MOUTH TWICE DAILY FOR THREE DAYS. THEN TAKE ONE TABLET DAILY WITH LASIX     Endocrinology:  Minerals - Potassium Supplementation Passed - 09/22/2022 10:21 AM      Passed - K in normal range and within 360 days    Potassium  Date Value Ref Range Status  09/15/2022 4.0 3.5 - 5.1 mmol/L Final  03/30/2014 3.7 3.5 - 5.1 mmol/L Final         Passed - Cr in normal range and within 360 days    Creat  Date Value Ref Range Status  08/05/2022 0.81 0.50 - 1.03 mg/dL Final   Creatinine, Ser  Date Value Ref Range Status  09/15/2022 0.79 0.44 - 1.00 mg/dL Final   Creatinine, Urine  Date Value Ref Range Status  02/20/2022 87 20 - 275 mg/dL Final         Passed - Valid encounter within last 12 months    Recent Outpatient Visits           1 week ago Type 2 diabetes mellitus with hyperglycemia, without long-term current use of insulin [facility])   [place], DO   1 month ago Essential hypertension, malignant   [facility] Teodora Medici, DO   2 months ago Type 2 diabetes mellitus with hyperglycemia, without long-term current use of insulin [facility])   [facility] Serafina Royals F, FNP   3 months ago Diarrhea, unspecified type   [facility] Teodora Medici, DO   3 months ago Type 2 diabetes mellitus with hyperglycemia, without long-term current use of insulin [facility])   [facility] Delsa Grana, PA-C       Future Appointments             In 2 months Teodora Medici, [facility], [facility]

## 2022-09-28 ENCOUNTER — Other Ambulatory Visit: Payer: Self-pay | Admitting: Nurse Practitioner

## 2022-09-28 DIAGNOSIS — G4733 Obstructive sleep apnea (adult) (pediatric): Secondary | ICD-10-CM | POA: Diagnosis not present

## 2022-09-28 DIAGNOSIS — E1165 Type 2 diabetes mellitus with hyperglycemia: Secondary | ICD-10-CM

## 2022-09-30 NOTE — Telephone Encounter (Signed)
Requested Prescriptions  Pending Prescriptions Disp Refills   pioglitazone (ACTOS) 15 MG tablet [Pharmacy Med Name: Pioglitazone HCl 15 MG Oral Tablet] 90 tablet 1    Sig: Take 1 tablet by mouth once daily     Endocrinology:  Diabetes - Glitazones - pioglitazone Passed - 09/28/2022  9:24 AM      Passed - HBA1C is between 0 and 7.9 and within 180 days    Hemoglobin A1C  Date Value Ref Range Status  09/10/2022 7.2 (A) 4.0 - 5.6 % Final   Hgb A1c MFr Bld  Date Value Ref Range Status  02/13/2022 6.0 (H) <5.7 % of total Hgb Final    Comment:    For someone without known diabetes, a hemoglobin  A1c value between 5.7% and 6.4% is consistent with prediabetes and should be confirmed with a  follow-up test. . For someone with known diabetes, a value <7% indicates that their diabetes is well controlled. A1c targets should be individualized based on duration of diabetes, age, comorbid conditions, and other considerations. . This assay result is consistent with an increased risk of diabetes. . Currently, no consensus exists regarding use of hemoglobin A1c for diagnosis of diabetes for children. Renella Cunas - Valid encounter within last 6 months    Recent Outpatient Visits           2 weeks ago Type 2 diabetes mellitus with hyperglycemia, without long-term current use of insulin [facility])   [facility] Teodora Medici, DO   1 month ago Essential hypertension, malignant   [facility] Teodora Medici, DO   3 months ago Type 2 diabetes mellitus with hyperglycemia, without long-term current use of insulin [facility])   [facility] Bo Merino, FNP   3 months ago Diarrhea, unspecified type   [facility] Teodora Medici, DO   3 months ago Type 2 diabetes mellitus with hyperglycemia, without long-term current use of insulin [facility])   [facility]  Delsa Grana, PA-C       Future Appointments             In 1 week Teodora Medici, [facility], [place]   In 2 months Teodora Medici, [facility], [facility]

## 2022-10-01 ENCOUNTER — Encounter: Payer: Self-pay | Admitting: *Deleted

## 2022-10-01 ENCOUNTER — Other Ambulatory Visit: Payer: Medicaid Other | Admitting: *Deleted

## 2022-10-01 NOTE — Patient Instructions (Signed)
Visit Information  [name] was given information about Medicaid Managed Care team care coordination services as a part of their [facility] Medicaid benefit. Stephanie [name] verbally consented to engagement with the [facility] Managed Care team.   If you are experiencing a medical emergency, please call 911 or report to your local emergency department or urgent care.   If you have a non-emergency medical problem during routine business hours, please contact your provider's office and ask to speak with a nurse.   For questions related to your [facility] health plan, please call: [phone] or go here:[email or web address]  If you would like to schedule transportation through your [facility] plan, please call the following number at least 2 days in advance of your appointment: [phone].  You can also use the MTM portal or MTM mobile app to manage your rides. For the portal, please go to [email or web address].  Call the [place] at [phone], at any time, 24 hours a day, 7 days a week. If you are in danger or need immediate medical attention call 911.  If you would like help to quit smoking, call 1-800-QUIT-NOW (320)325-7736) OR Espaol: 1-855-Djelo-Ya (7-062-376-2831) o para ms informacin haga clic aqu or Text READY to 200-400 to register via text  [name],   Please see education materials related to lung cancer screening provided by MyChart link.  Patient verbalizes understanding of instructions and care plan provided today and agrees to view in [place]. Active MyChart status and patient understanding of how to access instructions and care plan via MyChart confirmed with patient.     Telephone follow up appointment with Managed Medicaid care management team member scheduled for:10/31/22 @ 3:30pm  Lurena Joiner RN, BSN [place] RN Care Coordinator   Following is a copy of your plan of care:  Care Plan  : RN Care Manager Plan of Care  Updates made by Melissa Montane, RN since 10/01/2022 12:00 AM     Problem: Development of Plan of Care to address Health Management needs related to Stroke history      Long-Range Goal: Development of Plan of Care to address Health Management needs related to Stroke history   Start Date: 05/08/2022  Expected End Date: 08/06/2022  Priority: High  Note:   Current Barriers:  Chronic Disease Management support and education needs related to Improving Health with history of Stroke Patient with recent ED visit for migraine headaches. She takes monthly injections which helps with migraines, but will have break through migraines about 2 weeks after the injection.   RNCM Clinical Goal(s):  Patient will verbalize understanding of plan for management of Improving Health with history of stroke as evidenced by patient reports attend all scheduled medical appointments: 10/09/22 with PCP and Neurology, 10/23/22 with Pulmonology and CT for lung cancer screening as evidenced by provider documentation in EMR        continue to work with RN Care Manager and/or Social Worker to address care management and care coordination needs related to improving health with history of stroke as evidenced by adherence to CM Team Scheduled appointments     through collaboration with RN Care manager, provider, and care team.   Interventions: Inter-disciplinary care team collaboration (see longitudinal plan of care) Evaluation of current treatment plan related to  self management and patient's adherence to plan as established by provider Reviewed upcoming appointments including: PCP on 10/09/22 and Neurology on 10/09/22, 10/23/22 with Pulmonology and Lung CT   Diabetes Interventions:  (  Status:  New goal.) Long Term Goal Assessed patient's understanding of A1c goal: <7% Provided education to patient about basic DM disease process Reviewed medications with patient and discussed importance of  medication adherence Counseled on importance of regular laboratory monitoring as prescribed Discussed plans with patient for ongoing care management follow up and provided patient with direct contact information for care management team Reviewed scheduled/upcoming provider appointments including: 10/09/22 with PCP and Neurology and 10/23/22 with [place] [facility] for [place] patient, providing education and rationale, to check cbg 4 times a day and record, calling provider for findings outside established parameters  Lab Results  Component Value Date   HGBA1C 7.8 (A) 05/29/2022   Stroke:  (Status:Goal on track:  Yes.) Long Term Goal Reviewed Importance of taking all medications as prescribed Reviewed Importance of attending all scheduled provider appointments Advised to report any changes in symptoms or exercise tolerance Assessed social determinant of health barriers Reviewed the importance of exercise Reviewed recent Neurology note Encouraged patient to continue to exercise at home(stationary bike) Advised patient to discuss needing medication for break through migraines  Patient Goals/Self-Care Activities: Take medications as prescribed   Attend all scheduled provider appointments Call provider office for new concerns or questions

## 2022-10-01 NOTE — Patient Outreach (Signed)
Medicaid Managed Care   Nurse Care Manager Note  10/01/2022 Name:  Stephanie [name] MRN:  [id number] DOB:  April 11, 1966  Stephanie [name] is an 57 y.o. year old female who is a primary patient of Teodora Medici, DO.  The [facility] Managed Care Coordination team was consulted for assistance with:    Stroke DMII  Stephanie [name] was given information about Medicaid Managed Care Coordination team services today. Stephanie [name] Patient agreed to services and verbal consent obtained.  Engaged with patient by telephone for follow up visit in response to provider referral for case management and/or care coordination services.   Assessments/Interventions:  Review of past medical history, allergies, medications, health status, including review of consultants reports, laboratory and other test data, was performed as part of comprehensive evaluation and provision of chronic care management services.  SDOH (Social Determinants of Health) assessments and interventions performed: SDOH Interventions    Flowsheet Row Patient Outreach Telephone from 10/01/2022 in [place] Patient Outreach Telephone from 08/14/2022 in [place] Patient Outreach Telephone from 05/08/2022 in [place] Coordination Office Visit from 03/29/2022 in [facility]  SDOH Interventions      Food Insecurity Interventions -- -- Intervention Not Indicated --  Housing Interventions Intervention Not Indicated -- Intervention Not Indicated --  Transportation Interventions Intervention Not Indicated -- Other (Comment)  [Provided with [employer] transportation] --  Utilities Interventions -- Intervention Not Indicated -- --  Depression Interventions/Treatment  -- -- -- Medication       Care Plan  Allergies  Allergen Reactions   Gabapentin Itching   Ibuprofen Itching   Sulfa Antibiotics Hives   Tylenol [Acetaminophen]  Itching   Aspirin Itching   Buprenorphine Hcl Itching   Carbamazepine Itching, Other (See Comments) and Rash    Makes her feel like something is crawling under her skin.   Codeine Itching and Other (See Comments)    Makes her feel like something is crawling under her skin. Can take, sometimes makes her itch   Compazine Itching and Other (See Comments)    "makes my skin crawl"   Elemental Sulfur Hives, Rash and Other (See Comments)    Skin Rashes, Hives   Ioxaglate Itching   Ivp Dye [Iodinated Contrast Media] Itching and Other (See Comments)    Makes her itch really bad. Had to get 2-3 shots of Benadryl when she was in the hospital.   Metrizamide Itching   Naproxen Itching   Norco [Hydrocodone-Acetaminophen] Itching   Other Itching and Other (See Comments)    Makes her itch really bad. Had to get 2-3 shots of Benadryl when she was in the hospital.   Penicillin G Hives, Rash and Other (See Comments)    Has patient had a PCN reaction causing immediate rash, facial/tongue/throat swelling, SOB or lightheadedness with hypotension: Yes Has patient had a PCN reaction causing severe rash involving mucus membranes or skin necrosis: No Has patient had a PCN reaction that required hospitalization: No Has patient had a PCN reaction occurring within the last 10 years: No If all of the above answers are "NO", then may proceed with Cephalosporin use.    Prochlorperazine Maleate Other (See Comments)    Compazine makes her skin crawl - needs 2-3 shots of Benadryl to get relief.   Reglan [Metoclopramide] Itching and Other (See Comments)    Makes her skin crawl   Toradol [Ketorolac Tromethamine] Itching   Tramadol Itching  Zofran [Ondansetron Hcl] Itching    Medications Reviewed Today     Reviewed by Stephanie Montane, RN (Registered Nurse) on 10/01/22 at 1544  Med List Status: <None>   Medication Order Taking? Sig Documenting Provider Last Dose Status Informant  albuterol (VENTOLIN HFA) 108 (90  Base) MCG/ACT inhaler [id number] Yes Inhale 2 puffs into the lungs every 6 (six) hours as needed for wheezing or shortness of breath. Myles Gip, DO Taking Active Self, Multiple Informants  ARIPiprazole (ABILIFY) 15 MG tablet [id number] Yes Take 15 mg by mouth every morning. [provider] Taking Active Self, Multiple Informants  blood glucose meter kit and supplies [id number]  Dispense based on patient and insurance preference. Use up to four times daily as directed. (FOR ICD-10 E10.9, E11.9).  Patient not taking: Reported on 09/10/2022   Teodora Medici, DO  Active   CAPLYTA 42 MG capsule [id number] Yes Take 42 mg by mouth every evening. [provider] Taking Active Self, Multiple Informants  clopidogrel (PLAVIX) 75 MG tablet [id number] Yes Take 75 mg by mouth daily. [provider] Taking Active Self, Multiple Informants  diazepam (VALIUM) 10 MG tablet [id number] Yes Take 10 mg by mouth 3 (three) times daily. [provider] Taking Active Self, Multiple Informants           Med Note Cleopatra Cedar Jul 08, 2022 10:32 AM) Takes 1-2 QPM  fluticasone-salmeterol (ADVAIR) 100-50 MCG/ACT AEPB [id number] Yes Inhale 1 puff into the lungs 2 (two) times daily. Teodora Medici, DO Taking Active   furosemide (LASIX) 40 MG tablet [id number] Yes Take 1 tablet (40 mg total) by mouth 2 (two) times daily as needed for edema (take 40 mg in the morning, can take additional 40 mg in afternoon if needed for edema). Bo Merino, FNP Taking Active            Med Note Jodi Mourning, Toney Reil Jul 08, 2022 10:49 AM) Taking 40 mg twice daily  lamoTRIgine (LAMICTAL) 100 MG tablet [id number] Yes Take 100 mg by mouth 2 (two) times daily. [provider] Taking Active   metFORMIN (GLUCOPHAGE-XR) 500 MG 24 hr tablet [id number] Yes Take 1 tablet (500 mg total) by mouth 2 (two) times daily with a meal. Teodora Medici, DO Taking Active   omeprazole (PRILOSEC) 40  MG capsule [id number] No Take 1 capsule (40 mg total) by mouth daily.  Patient not taking: Reported on 10/01/2022   Teodora Medici, DO Not Taking Active            Med Note Thamas Jaegers, Kimble Delaurentis A   Tue Oct 01, 2022  3:42 PM) Needs refill  pioglitazone (ACTOS) 15 MG tablet [id number] Yes Take 1 tablet by mouth once daily Teodora Medici, DO Taking Active   potassium chloride SA (KLOR-CON M20) 20 MEQ tablet [id number] Yes TAKE 1 TABLET BY MOUTH TWICE DAILY FOR THREE DAYS. THEN TAKE ONE TABLET DAILY WITH Ruby Cola, DO Taking Active   prazosin (MINIPRESS) 2 MG capsule [id number] Yes Take 4 mg by mouth every evening. [provider] Taking Active Self, Multiple Informants  traZODone (DESYREL) 150 MG tablet [id number] Yes Take by mouth at bedtime. [provider] Taking Active   Vilazodone HCl (VIIBRYD) 40 MG TABS [id number] Yes Take 40 mg by mouth daily. 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   Bipolar disorder (HCC) 05/09/2016   Hyperlipidemia, unspecified 05/09/2016   Irritable bowel syndrome 05/09/2016   Lumbago with sciatica 05/09/2016   Esophageal reflux 05/09/2016   Scoliosis 05/09/2016   Diverticulosis of colon 05/09/2016   DDD (degenerative disc disease), cervical 05/09/2016   ICH (intracerebral hemorrhage) (HCC)    Essential hypertension, malignant  04/19/2016   Cytotoxic brain edema (HCC) 04/19/2016   IVH (intraventricular hemorrhage) (HCC) 04/18/2016   Bilateral carpal tunnel syndrome 04/12/2016   Falls frequently 01/12/2016   Trigeminal neuralgia 06/08/2015   Sacroiliac dysfunction 03/04/2014   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 or radiculopathy 05/20/2013   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   Tobacco use disorder 09/11/2012   History of cervical cancer 07/30/2012   Hepatitis C     Conditions to be addressed/monitored per PCP order:  DMII and Stroke  Care Plan : RN Care Manager Plan of Care  Updates made by Heidi Dach, RN since 10/01/2022 12:00 AM     Problem: Development of Plan of Care to address Health Management needs related to Stroke history      Long-Range Goal: Development of Plan of Care to address Health Management needs related to Stroke history   Start Date: 05/08/2022  Expected End Date: 08/06/2022  Priority: High  Note:   Current Barriers:  Chronic Disease Management support and education needs related to Improving Health with history of Stroke Patient with recent ED visit for migraine headaches. She takes monthly injections which helps with migraines, but will have break through migraines about 2 weeks after the injection.   RNCM Clinical Goal(s):  Patient will verbalize understanding of plan for management of Improving Health with history of stroke as evidenced by patient reports attend all scheduled medical appointments: 10/09/22 with PCP and Neurology, 10/23/22 with Pulmonology and CT for lung cancer screening as evidenced by provider documentation in EMR        continue to work with RN Care Manager and/or Social Worker to address care management and care coordination needs related to improving health with history of stroke as evidenced by adherence to CM Team Scheduled appointments     through collaboration with Medical illustrator, provider, and care team.    Interventions: Inter-disciplinary care team collaboration (see longitudinal plan of care) Evaluation of current treatment plan related to  self management and patient's adherence to plan as established by provider Reviewed upcoming appointments including: PCP on 10/09/22 and Neurology on 10/09/22, 10/23/22 with Pulmonology and Lung CT   Diabetes Interventions:  (Status:  New goal.) Long Term Goal Assessed patient's understanding of A1c goal: <7% Provided education to patient about basic DM disease process Reviewed medications with patient and discussed importance of medication adherence Counseled on importance of regular laboratory monitoring as prescribed Discussed plans with patient for ongoing care management follow up and provided patient with direct contact information for care management team Reviewed scheduled/upcoming provider appointments including: 10/09/22 with PCP and Neurology and 10/23/22 with [facility] for Lung Cancer Screening  Advised patient, providing education and rationale, to check cbg 4 times a day and record, calling provider for findings outside established parameters  Lab Results  Component Value Date   HGBA1C 7.8 (A) 05/29/2022   Stroke:  (Status:Goal on track:  Yes.) Long Term Goal Reviewed Importance of taking all medications as prescribed Reviewed Importance of attending all scheduled provider appointments Advised to report any changes in symptoms or exercise tolerance Assessed social determinant of health barriers Reviewed the importance of exercise Reviewed recent Neurology note Encouraged patient to continue to exercise at home(stationary bike) Advised patient to discuss needing medication for break through migraines  Patient Goals/Self-Care Activities: Take medications as prescribed   Attend all scheduled provider appointments Call provider office for new concerns or questions        Follow Up:  Patient agrees to Care Plan and  Follow-up.  Plan: The Managed Medicaid care management team will reach out to the patient again over the next 30 days.  Date/time of next scheduled RN care management/care coordination outreach:  10/31/22 @ 3:30pm  Estanislado Emms RN, BSN [place]  [facility] RN Care Coordinator

## 2022-10-04 ENCOUNTER — Other Ambulatory Visit: Payer: Self-pay | Admitting: Emergency Medicine

## 2022-10-04 DIAGNOSIS — M7989 Other specified soft tissue disorders: Secondary | ICD-10-CM

## 2022-10-04 MED ORDER — FUROSEMIDE 40 MG PO TABS: 40.0000 mg | ORAL_TABLET | Freq: Two times a day (BID) | ORAL | 1 refills | Status: DC | PRN

## 2022-10-08 NOTE — Progress Notes (Deleted)
Established Patient Office Visit  Subjective   [name]: Stephanie [name], female    DOB: 03/21/1966  Age: 57 y.o. MRN: [id number]  No chief complaint on file.   HPI Patient is here for follow up.   Diabetes, Type 2: -Last A1c 1/24 7.2% -Medications: Metformin 1000 mg BID (had been on Glipizide in the past), Actos 15 mg but uncertain if she is taking this one -Patient is compliant with the above medications and reports no side effects. Diarrhea stopped since switching to Metformin ER -Checking BG at home: average 120-150, yesterday morning was 68 but hadn't eaten a lot the night before -Diet: ate a baked potato for dinner, cheese croissant for breakfast  -Eye exam: UTD 8/23 -Foot exam: UTD 9/23 -Microalbumin: UTD 6/23 -Statin: yes -PNA vaccine: UTD -Denies symptoms of hypoglycemia, polyuria, polydipsia, numbness extremities, foot ulcers/trauma.   HLD/Hx of CVA: -CVA August 2017, follows with Neurology last seen 08/07/22 -Medications: Crestor 20 mg not on Plavix 75 mg but it is on her list  -Patient is compliant with above medications and reports no side effects.  -Last lipid panel: Lipid Panel     Component Value Date/Time   CHOL 123 02/13/2022 1211   TRIG 69 02/13/2022 1211   HDL 73 02/13/2022 1211   CHOLHDL 1.7 02/13/2022 1211   VLDL 18 04/22/2016 0357   LDLCALC 35 02/13/2022 1211   COPD: -COPD status: stable -Current medications: Advair BID switched at LOV due to insurance coverage, Albuterol PRN -Satisfied with current treatment?: yes - doesn't ike the Advair as much as the [employer] but overall doing well  -Oxygen use: no -Dyspnea frequency: occasional  -Cough frequency: no -Rescue inhaler frequency:  more recently due to cold -Limitation of activity: yes -Productive cough: no -Pneumovax: Up to Date -Influenza: Up to Date -Former smoker  Bilateral Nephrolithiasis: -Chronic, following with Urology.  GERD: -Currently on Prilosec 40 mg daily, doing well    Depression: -Follows with Psychiatry at [facility]  -Currently on Abilify 15 mg, Caplyta 42 mg, Vviibryd 40 mg and Trazodone 50 mg, Valium PRN  Patient Active Problem List   Diagnosis Date Noted   Hypokalemia 04/17/2022   Leg swelling 02/13/2022   Wheezing 02/13/2022   Migraine 05/12/2020   Type 2 diabetes mellitus ([place]) 08/22/2018   Other specified cardiac dysrhythmias 08/22/2018   Acute encephalopathy 08/21/2018   Kidney stone 03/10/2018   UTI (urinary tract infection) 08/21/2017   History of CVA (cerebrovascular accident) 01/28/2017   History of migraine 01/28/2017   Small bowel obstruction ([place]) 09/02/2016   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   Bipolar disorder ([place]) 05/09/2016   Hyperlipidemia, unspecified 05/09/2016   Irritable bowel syndrome 05/09/2016   Lumbago with sciatica 05/09/2016   Esophageal reflux 05/09/2016   Scoliosis 05/09/2016   Diverticulosis of colon 05/09/2016   DDD (degenerative disc disease), cervical 05/09/2016   ICH (intracerebral hemorrhage) ([place])    Essential hypertension, malignant 04/19/2016   Cytotoxic brain edema ([place]) 04/19/2016   IVH (intraventricular hemorrhage) ([place]) 04/18/2016   Bilateral carpal tunnel syndrome 04/12/2016   Falls frequently 01/12/2016   Trigeminal neuralgia 06/08/2015   Sacroiliac dysfunction 03/04/2014   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 or radiculopathy 05/20/2013   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   Tobacco use disorder 09/11/2012   History of cervical cancer 07/30/2012   Hepatitis C    Past Medical History:  Diagnosis Date   Closed fracture of shaft of humerus 01/20/2017   Diabetes mellitus without complication ([place])  Difficult intubation    Diverticulitis    Headache    Hepatitis C    treated and resolved   History of kidney stones    Hypertension    Hypotension 08/22/2018   IBS (irritable bowel syndrome)    Kidney stones    Sciatica    Sleep apnea    on cpap    Stroke ([place]) 2017   memory loss   Superficial venous thrombosis of arm, left 07/22/2016   Past Surgical History:  Procedure Laterality Date   ABDOMINAL HYSTERECTOMY  2007   carpel tunnel syndrome  2017   HARDWARE REMOVAL Right 12/22/2017   Procedure: HARDWARE REMOVAL-RIGHT ARM;  Surgeon: Lovell Sheehan, MD;  Location: [facility] ORS;  Service: Orthopedics;  Laterality: Right;  Removal of implant, superficial right humerus   HUMERUS IM NAIL Right 03/11/2017   Procedure: INTRAMEDULLARY (IM) NAIL HUMERAL;  Surgeon: Lovell Sheehan, MD;  Location: [facility];  Service: Orthopedics;  Laterality: Right;   KIDNEY STONE SURGERY Right 02/12/2012   KIDNEY STONE SURGERY Left 07/15/2012   LIGATION OF ARTERIOVENOUS  FISTULA Left 06/21/2016   Procedure: LIGATION OF ARTERIOVENOUS  FISTULA ( LIGATION BASILIC VEIN );  Surgeon: Katha Cabal, MD;  Location: [facility] ORS;  Service: Vascular;  Laterality: Left;   LITHOTRIPSY     OOPHORECTOMY     SINUS EXPLORATION     TONSILLECTOMY     Social History   Tobacco Use   Smoking status: Every Day    Packs/day: 1.00    Years: 1.50    Total pack years: 1.50    Types: Cigarettes   Smokeless tobacco: Never  Vaping Use   Vaping Use: Never used  Substance Use Topics   Alcohol use: Not Currently    Comment: no alcohol since 2002   Drug use: No   Social History   Socioeconomic History   Marital status: Legally Separated    Spouse name: Not on file   Number of children: Not on file   Years of education: Not on file   Highest education level: Not on file  Occupational History   Not on file  Tobacco Use   Smoking status: Every Day    Packs/day: 1.00    Years: 1.50    Total pack years: 1.50    Types: Cigarettes   Smokeless tobacco: Never  Vaping Use   Vaping Use: Never used  Substance and Sexual Activity   Alcohol use: Not Currently    Comment: no alcohol since 2002   Drug use: No   Sexual activity: Not on file  Other Topics Concern   Not on file   Social History Narrative   Not on file   Social Determinants of Health   Financial Resource Strain: Not on file  Food Insecurity: No Food Insecurity (05/08/2022)   Hunger Vital Sign    Worried About Running Out of Food in the Last Year: Never true    Ran Out of Food in the Last Year: Never true  Transportation Needs: Unmet Transportation Needs (10/01/2022)   PRAPARE - Hydrologist (Medical): Yes    Lack of Transportation (Non-Medical): No  Physical Activity: Not on file  Stress: Not on file  Social Connections: Not on file  Intimate Partner Violence: Not on file   Family Status  Relation Name Status   Mother  Deceased   Father  Alive   Sister  Deceased   Family History  Problem Relation Age of  Onset   Heart failure Mother    Diabetes Father    Cancer Sister    Allergies  Allergen Reactions   Gabapentin Itching   Ibuprofen Itching   Sulfa Antibiotics Hives   Tylenol [Acetaminophen] Itching   Aspirin Itching   Buprenorphine Hcl Itching   Carbamazepine Itching, Other (See Comments) and Rash    Makes her feel like something is crawling under her skin.   Codeine Itching and Other (See Comments)    Makes her feel like something is crawling under her skin. Can take, sometimes makes her itch   Compazine Itching and Other (See Comments)    "makes my skin crawl"   Elemental Sulfur Hives, Rash and Other (See Comments)    Skin Rashes, Hives   Ioxaglate Itching   Ivp Dye [Iodinated Contrast Media] Itching and Other (See Comments)    Makes her itch really bad. Had to get 2-3 shots of Benadryl when she was in the hospital.   Metrizamide Itching   Naproxen Itching   Norco [Hydrocodone-Acetaminophen] Itching   Other Itching and Other (See Comments)    Makes her itch really bad. Had to get 2-3 shots of Benadryl when she was in the hospital.   Penicillin G Hives, Rash and Other (See Comments)    Has patient had a PCN reaction causing immediate rash,  facial/tongue/throat swelling, SOB or lightheadedness with hypotension: Yes Has patient had a PCN reaction causing severe rash involving mucus membranes or skin necrosis: No Has patient had a PCN reaction that required hospitalization: No Has patient had a PCN reaction occurring within the last 10 years: No If all of the above answers are "NO", then may proceed with Cephalosporin use.    Prochlorperazine Maleate Other (See Comments)    Compazine makes her skin crawl - needs 2-3 shots of Benadryl to get relief.   Reglan [Metoclopramide] Itching and Other (See Comments)    Makes her skin crawl   Toradol [Ketorolac Tromethamine] Itching   Tramadol Itching   Zofran [Ondansetron Hcl] Itching    Review of Systems  Constitutional:  Negative for chills and fever.  Respiratory:  Negative for cough, shortness of breath and wheezing.   Cardiovascular:  Negative for chest pain.  Gastrointestinal:  Negative for abdominal pain, heartburn, nausea and vomiting.      Objective:     There were no vitals taken for this visit. BP Readings from Last 3 Encounters:  09/21/22 120/73  09/15/22 102/71  09/10/22 128/74   Wt Readings from Last 3 Encounters:  09/21/22 195 lb (88.5 kg)  09/15/22 195 lb (88.5 kg)  09/10/22 196 lb 6.4 oz (89.1 kg)     Physical Exam Constitutional:      Appearance: Normal appearance.  HENT:     Head: Normocephalic and atraumatic.  Eyes:     Conjunctiva/sclera: Conjunctivae normal.  Cardiovascular:     Rate and Rhythm: Normal rate and regular rhythm.  Pulmonary:     Effort: Pulmonary effort is normal.     Breath sounds: Normal breath sounds.  Skin:    General: Skin is warm and dry.     Comments: New nose piercing on left side, not infected but bleeding slightly  Neurological:     Mental Status: She is alert. Mental status is at baseline.  Psychiatric:        Mood and Affect: Affect is flat.        Speech: Speech is delayed.        Behavior:  Behavior normal.         Thought Content: Thought content normal.     No results found for any visits on 10/09/22.   Last CBC Lab Results  Component Value Date   WBC 4.9 09/15/2022   HGB 13.1 09/15/2022   HCT 42.4 09/15/2022   MCV 99.1 09/15/2022   MCH 30.6 09/15/2022   RDW 13.9 09/15/2022   PLT 169 [id number]   Last metabolic panel Lab Results  Component Value Date   GLUCOSE 123 (H) 09/15/2022   NA 142 09/15/2022   K 4.0 09/15/2022   CL 107 09/15/2022   CO2 27 09/15/2022   BUN 14 09/15/2022   CREATININE 0.79 09/15/2022   GFRNONAA >60 09/15/2022   CALCIUM 9.5 09/15/2022   PHOS 2.3 (L) 08/22/2018   PROT 7.1 09/15/2022   ALBUMIN 3.5 09/15/2022   BILITOT 0.2 (L) 09/15/2022   ALKPHOS 72 09/15/2022   AST 26 09/15/2022   ALT 28 09/15/2022   ANIONGAP 8 09/15/2022   Last lipids Lab Results  Component Value Date   CHOL 123 02/13/2022   HDL 73 02/13/2022   LDLCALC 35 02/13/2022   TRIG 69 02/13/2022   CHOLHDL 1.7 02/13/2022   Last hemoglobin A1c Lab Results  Component Value Date   HGBA1C 7.2 (A) 09/10/2022   Last thyroid functions Lab Results  Component Value Date   TSH 1.218 04/16/2022   Last vitamin D No results found for: "25OHVITD2", "25OHVITD3", "VD25OH" Last vitamin B12 and Folate Lab Results  Component Value Date   [id number] 02/20/2022      The ASCVD Risk score (Arnett DK, et al., 2019) failed to calculate for the following reasons:   The patient has a prior MI or stroke diagnosis    Assessment & Plan:   1. Type 2 diabetes mellitus with hyperglycemia, without long-term current use of insulin ([place]): A1c in the office improved at 7.2%.  She is doing much better on the metformin extended release 500 mg twice daily.  She was also prescribed Actos 15 mg daily, patient is uncertain if she is taking this but will check at home and let me know.  Otherwise A1c is better controlled, continue current medications.  Follow-up in 3 months.  - POCT HgB A1C  2. Pulmonary  emphysema, unspecified emphysema type ([place]): Stable, doing ok with Advair, Albuterol PRN.  3. Gastroesophageal reflux disease, unspecified whether esophagitis present: Stable.  Continue Prilosec 40 mg.  4. Leg swelling: Improved.  Patient is taking Lasix as needed for lower extremity swelling which she has not had to take in about 1 month.  Reiterated with the patient to only take potassium when she is taking the Lasix.  A few months ago she was having some orthostatic hypotension related to the Lasix with recurrent falls, which has improved.   No follow-ups on file.    Teodora Medici, DO

## 2022-10-09 ENCOUNTER — Ambulatory Visit: Payer: Medicaid Other | Admitting: Internal Medicine

## 2022-10-10 ENCOUNTER — Emergency Department (HOSPITAL_COMMUNITY): Payer: Medicaid Other

## 2022-10-10 ENCOUNTER — Ambulatory Visit: Payer: Self-pay

## 2022-10-10 ENCOUNTER — Other Ambulatory Visit: Payer: Self-pay

## 2022-10-10 ENCOUNTER — Emergency Department (HOSPITAL_COMMUNITY)
Admission: EM | Admit: 2022-10-10 | Discharge: 2022-10-10 | Disposition: A | Discharge status: Home / Self Care | Payer: Medicaid Other | Attending: Emergency Medicine | Admitting: Emergency Medicine

## 2022-10-10 ENCOUNTER — Encounter (HOSPITAL_COMMUNITY): Payer: Self-pay

## 2022-10-10 DIAGNOSIS — Z419 Encounter for procedure for purposes other than remedying health state, unspecified: Secondary | ICD-10-CM | POA: Diagnosis not present

## 2022-10-10 DIAGNOSIS — R519 Headache, unspecified: Secondary | ICD-10-CM

## 2022-10-10 DIAGNOSIS — R0602 Shortness of breath: Secondary | ICD-10-CM | POA: Insufficient documentation

## 2022-10-10 DIAGNOSIS — R079 Chest pain, unspecified: Secondary | ICD-10-CM | POA: Diagnosis not present

## 2022-10-10 DIAGNOSIS — E119 Type 2 diabetes mellitus without complications: Secondary | ICD-10-CM | POA: Diagnosis not present

## 2022-10-10 DIAGNOSIS — Z20822 Contact with and (suspected) exposure to covid-19: Secondary | ICD-10-CM | POA: Diagnosis not present

## 2022-10-10 DIAGNOSIS — R42 Dizziness and giddiness: Secondary | ICD-10-CM | POA: Insufficient documentation

## 2022-10-10 DIAGNOSIS — Z7984 Long term (current) use of oral hypoglycemic drugs: Secondary | ICD-10-CM | POA: Diagnosis not present

## 2022-10-10 DIAGNOSIS — R000 Tachycardia, unspecified: Secondary | ICD-10-CM | POA: Diagnosis not present

## 2022-10-10 LAB — COMPREHENSIVE METABOLIC PANEL
ALT: 19 U/L (ref 0–44)
AST: 22 U/L (ref 15–41)
Albumin: 4.1 g/dL (ref 3.5–5.0)
Alkaline Phosphatase: 96 U/L (ref 38–126)
Anion gap: 11 (ref 5–15)
BUN: 18 mg/dL (ref 6–20)
CO2: 31 mmol/L (ref 22–32)
Calcium: 9.4 mg/dL (ref 8.9–10.3)
Chloride: 99 mmol/L (ref 98–111)
Creatinine, Ser: 0.9 mg/dL (ref 0.44–1.00)
GFR, Estimated: >60 mL/min (ref >60)
Glucose, Bld: 100 mg/dL — ABNORMAL HIGH (ref 70–99)
Potassium: 3.6 mmol/L (ref 3.5–5.1)
Sodium: 141 mmol/L (ref 135–145)
Total Bilirubin: 0.5 mg/dL (ref 0.3–1.2)
Total Protein: 8 g/dL (ref 6.5–8.1)

## 2022-10-10 LAB — RESP PANEL BY RT-PCR (RSV, FLU A&B, COVID)  RVPGX2
Influenza A by PCR: NEGATIVE
Influenza B by PCR: NEGATIVE
Resp Syncytial Virus by PCR: NEGATIVE
SARS Coronavirus 2 by RT PCR: NEGATIVE

## 2022-10-10 LAB — CBC
HCT: 43.3 % (ref 36.0–46.0)
Hemoglobin: 14.2 g/dL (ref 12.0–15.0)
MCH: 31.5 pg (ref 26.0–34.0)
MCHC: 32.8 g/dL (ref 30.0–36.0)
MCV: 96 fL (ref 80.0–100.0)
Platelets: 194 10*3/uL (ref 150–400)
RBC: 4.51 MIL/uL (ref 3.87–5.11)
RDW: 13.9 % (ref 11.5–15.5)
WBC: 8.3 10*3/uL (ref 4.0–10.5)
nRBC: 0 % (ref 0.0–0.2)

## 2022-10-10 LAB — CBG MONITORING, ED: Glucose-Capillary: 119 mg/dL — ABNORMAL HIGH (ref 70–99)

## 2022-10-10 LAB — TROPONIN I (HIGH SENSITIVITY)
Troponin I (High Sensitivity): <2 ng/L (ref <18)
Troponin I (High Sensitivity): <2 ng/L (ref <18)

## 2022-10-10 MED ORDER — HYDROMORPHONE HCL 1 MG/ML IJ SOLN
0.5000 mg | Freq: Once | INTRAMUSCULAR | Status: AC
  Administered 2022-10-10: 0.5 mg via INTRAVENOUS
  Filled 2022-10-10: qty 1

## 2022-10-10 MED ORDER — OXYCODONE-ACETAMINOPHEN 5-325 MG PO TABS: 2.0000 | ORAL_TABLET | ORAL | 0 refills | Status: DC | PRN

## 2022-10-10 MED ORDER — SODIUM CHLORIDE 0.9 % IV BOLUS
500.0000 mL | Freq: Once | INTRAVENOUS | Status: AC
  Administered 2022-10-10: 500 mL via INTRAVENOUS

## 2022-10-10 MED ORDER — MORPHINE SULFATE (PF) 4 MG/ML IV SOLN
4.0000 mg | Freq: Once | INTRAVENOUS | Status: AC
  Administered 2022-10-10: 4 mg via INTRAVENOUS
  Filled 2022-10-10: qty 1

## 2022-10-10 NOTE — ED Provider Triage Note (Signed)
Emergency Medicine Provider Triage Evaluation Note  Stephanie [name] , a 57 y.o. female  was evaluated in triage.  Pt complains of headache, denies dizziness, feeling "numb inside my head" and chest pain x 1 week.  Patient has history of CVA in 2017 and takes Plavix.  She also has history of diabetes.  States her blood sugar meter was reading high today but after several drinks of water her blood sugar has come down to 130s on her CGM.  Review of Systems  Positive: Take, Dizziness, chest pain Negative: Syncope  Physical Exam  BP (!) 138/122 (BP Location: Right Arm)   Pulse (!) 102   Temp 99.1 F (37.3 C) (Oral)   Resp (!) 21   SpO2 97%  Gen:   Awake, no distress   Resp:  Normal effort  MSK:   Moves extremities without difficulty  Other:  Normal strength all extremities are equal, mildly facial droop the patient states is her baseline and converses  Medical Decision Making  Medically screening exam initiated at 2:47 PM.  Appropriate orders placed.  MARGRET MOAT was informed that the remainder of the evaluation will be completed by another provider, this initial triage assessment does not replace that evaluation, and the importance of remaining in the ED until their evaluation is complete.     Gwenevere Abbot, [place] 10/10/22 [phone]

## 2022-10-10 NOTE — Discharge Instructions (Signed)
Drink plenty of fluids and follow-up with your family doctor next week for recheck °

## 2022-10-10 NOTE — Telephone Encounter (Signed)
Patient states that her blood sugar is 160 and she is dizzy. Please advise.   Left message to call back about symptoms

## 2022-10-10 NOTE — ED Triage Notes (Signed)
C/o headache, dizziness with standing, congestion, sob, and centralized cp w/o radiation.  Pt reports cbg-high this am and rechecked it and was 360. Cbg-119 in triage.

## 2022-10-10 NOTE — Telephone Encounter (Signed)
Summary: blood sugar   Patient states that her blood sugar is 160 and she is dizzy. Please advise.           Chief Complaint: elevated BS 360 dizziness Symptoms: BS 360 at approx 1140. Drinking water and rechecked for 280 now. Dizziness noted lightheaded. Headache, c/o chest pain and breathing issues this am. Hx stroke. Reports "knot on back of head" x 2 weeks and now gone. Patient noted with slurred speech unknown if baseline after hx stroke.  Frequency: this am  Pertinent Negatives: Patient denies chest pain no difficulty breathing no N/T on either side of face or body.  Disposition: [x] ED /[] Urgent Care (no appt availability in office) / [] Appointment(In office/virtual)/ []  [facility]/ [] Home Care/ [] Refused Recommended Disposition /[] [facility]/ []  Follow-up with PCP Additional Notes:   Recommended ED for evaluation due to sx and hx stroke.      Reason for Disposition  Patient sounds very sick or weak to the triager  Answer Assessment - Initial Assessment Questions 1. BLOOD GLUCOSE: "What is your blood glucose level?"      360  and now 280 after drinking water  2. ONSET: "When did you check the blood glucose?"     1140  3. USUAL RANGE: "What is your glucose level usually?" (e.g., usual fasting morning value, usual evening value)     68-130 4. KETONES: "Do you check for ketones (urine or blood test strips)?" If Yes, ask: "What does the test show now?"      na 5. TYPE 1 or 2:  "Do you know what type of diabetes you have?"  (e.g., Type 1, Type 2, Gestational; doesn't know)      Type 2  6. INSULIN: "Do you take insulin?" "What type of insulin(s) do you use? What is the mode of delivery? (syringe, pen; injection or pump)?"      na 7. DIABETES PILLS: "Do you take any pills for your diabetes?" If Yes, ask: "Have you missed taking any pills recently?"     Yes took medicaitons 8. OTHER SYMPTOMS: "Do you have any symptoms?" (e.g., fever, frequent urination,  difficulty breathing, dizziness, weakness, vomiting)     Dizziness, headache , lightheaded. C/o chest pain and breathing issues this am. Hx stroke  reports "knot" on back of head x 2 weeks and now gone.  9. PREGNANCY: "Is there any chance you are pregnant?" "When was your last menstrual period?"     na  Protocols used: Diabetes - High Blood Sugar-A-AH

## 2022-10-10 NOTE — ED Provider Notes (Signed)
[place] EMERGENCY DEPARTMENT AT [facility] Provider Note   CSN: [id number] Arrival date & time: 10/10/22  1414     History  Chief Complaint  Patient presents with   Dizziness   Shortness of Breath    Stephanie [name] is a 57 y.o. female.  Patient complains of a headache.  Patient has a history of headaches.  She also is a history of diabetes.  Patient has mild dizziness  The history is provided by the patient and medical records. No language interpreter was used.  Dizziness Quality:  Lightheadedness Severity:  Mild Onset quality:  Sudden Timing:  Intermittent Progression:  Waxing and waning Chronicity:  Recurrent Context: not when bending over   Relieved by:  Nothing Worsened by:  Nothing Ineffective treatments:  None tried Associated symptoms: shortness of breath   Associated symptoms: no chest pain, no diarrhea and no headaches   Shortness of Breath Associated symptoms: no abdominal pain, no chest pain, no cough, no headaches and no rash        Home Medications Prior to Admission medications   Medication Sig Start Date End Date Taking? Authorizing Provider  oxyCODONE-acetaminophen (PERCOCET) 5-325 MG tablet Take 2 tablets by mouth every 4 (four) hours as needed. 10/10/22  Yes Milton Ferguson, MD  albuterol (VENTOLIN HFA) 108 (90 Base) MCG/ACT inhaler Inhale 2 puffs into the lungs every 6 (six) hours as needed for wheezing or shortness of breath. 02/13/22   Myles Gip, DO  ARIPiprazole (ABILIFY) 15 MG tablet Take 15 mg by mouth every morning. 06/10/21   [provider]  blood glucose meter kit and supplies Dispense based on patient and insurance preference. Use up to four times daily as directed. (FOR ICD-10 E10.9, E11.9). 07/30/22   Teodora Medici, DO  CAPLYTA 42 MG capsule Take 42 mg by mouth every evening. 06/08/21   [provider]  clopidogrel (PLAVIX) 75 MG tablet Take 75 mg by mouth daily. 02/25/22   [provider]   diazepam (VALIUM) 10 MG tablet Take 10 mg by mouth 3 (three) times daily. 03/27/22   [provider]  fluticasone-salmeterol (ADVAIR) 100-50 MCG/ACT AEPB Inhale 1 puff into the lungs 2 (two) times daily. 08/05/22   Teodora Medici, DO  furosemide (LASIX) 40 MG tablet Take 1 tablet (40 mg total) by mouth 2 (two) times daily as needed for edema (take 40 mg in the morning, can take additional 40 mg in afternoon if needed for edema). 10/04/22   Teodora Medici, DO  lamoTRIgine (LAMICTAL) 100 MG tablet Take 100 mg by mouth 2 (two) times daily.    [provider]  metFORMIN (GLUCOPHAGE-XR) 500 MG 24 hr tablet Take 1 tablet (500 mg total) by mouth 2 (two) times daily with a meal. 06/12/22   Teodora Medici, DO  omeprazole (PRILOSEC) 40 MG capsule Take 1 capsule (40 mg total) by mouth daily. 06/06/22   Teodora Medici, DO  pioglitazone (ACTOS) 15 MG tablet Take 1 tablet by mouth once daily 09/30/22   Teodora Medici, DO  potassium chloride SA (KLOR-CON M20) 20 MEQ tablet TAKE 1 TABLET BY MOUTH TWICE DAILY FOR THREE DAYS. THEN TAKE ONE TABLET DAILY WITH LASIX 09/23/22   Teodora Medici, DO  prazosin (MINIPRESS) 2 MG capsule Take 4 mg by mouth every evening. 02/15/21   [provider]  traZODone (DESYREL) 150 MG tablet Take by mouth at bedtime.    [provider]  Vilazodone HCl (VIIBRYD) 40 MG TABS Take 40 mg by  mouth daily.    [provider]      Allergies    Gabapentin, Ibuprofen, Sulfa antibiotics, Tylenol [acetaminophen], Aspirin, Buprenorphine hcl, Carbamazepine, Codeine, Compazine, Elemental sulfur, Ioxaglate, Ivp dye [iodinated contrast media], Metrizamide, Naproxen, Norco [hydrocodone-acetaminophen], Other, Penicillin g, Prochlorperazine maleate, Reglan [metoclopramide], Toradol [ketorolac tromethamine], Tramadol, and Zofran [ondansetron hcl]    Review of Systems   Review of Systems  Constitutional:  Negative for appetite change and fatigue.   HENT:  Negative for congestion, ear discharge and sinus pressure.   Eyes:  Negative for discharge.  Respiratory:  Positive for shortness of breath. Negative for cough.   Cardiovascular:  Negative for chest pain.  Gastrointestinal:  Negative for abdominal pain and diarrhea.  Genitourinary:  Negative for frequency and hematuria.  Musculoskeletal:  Negative for back pain.  Skin:  Negative for rash.  Neurological:  Positive for dizziness. Negative for seizures and headaches.  Psychiatric/Behavioral:  Negative for hallucinations.     Physical Exam Updated Vital Signs BP 121/68   Pulse 80   Temp 97.7 F (36.5 C) (Oral)   Resp 16   Ht 5\' 2"  (1.575 m)   Wt 83.5 kg   SpO2 92%   BMI 33.65 kg/m  Physical Exam Vitals and nursing note reviewed.  Constitutional:      Appearance: She is well-developed.  HENT:     Head: Normocephalic.     Nose: Nose normal.  Eyes:     General: No scleral icterus.    Conjunctiva/sclera: Conjunctivae normal.  Neck:     Thyroid: No thyromegaly.  Cardiovascular:     Rate and Rhythm: Normal rate and regular rhythm.     Heart sounds: No murmur heard.    No friction rub. No gallop.  Pulmonary:     Breath sounds: No stridor. No wheezing or rales.  Chest:     Chest wall: No tenderness.  Abdominal:     General: There is no distension.     Tenderness: There is no abdominal tenderness. There is no rebound.  Musculoskeletal:        General: Normal range of motion.     Cervical back: Neck supple.  Lymphadenopathy:     Cervical: No cervical adenopathy.  Skin:    Findings: No erythema or rash.  Neurological:     Mental Status: She is alert and oriented to person, place, and time.     Motor: No abnormal muscle tone.     Coordination: Coordination normal.  Psychiatric:        Behavior: Behavior normal.     ED Results / Procedures / Treatments   Labs (all labs ordered are listed, but only abnormal results are displayed) Labs Reviewed  COMPREHENSIVE  METABOLIC PANEL - Abnormal; Notable for the following components:      Result Value   Glucose, Bld 100 (*)    All other components within normal limits  CBG MONITORING, ED - Abnormal; Notable for the following components:   Glucose-Capillary 119 (*)    All other components within normal limits  RESP PANEL BY RT-PCR (RSV, FLU A&B, COVID)  RVPGX2  CBC  TROPONIN I (HIGH SENSITIVITY)  TROPONIN I (HIGH SENSITIVITY)    EKG None  Radiology DG Chest Port 1 View  Result Date: 10/10/2022 CLINICAL DATA:  Provided history: Chest pain. EXAM: PORTABLE CHEST 1 VIEW COMPARISON:  Prior chest radiographs 07/14/2021 and earlier. FINDINGS: Heart size within normal limits. Aortic atherosclerosis. No appreciable airspace consolidation. No evidence of pleural effusion or pneumothorax. No  acute bony abnormality identified. IMPRESSION: No evidence of acute cardiopulmonary abnormality. Aortic Atherosclerosis (ICD10-I70.0). Electronically Signed   By: Kellie Simmering D.O.   On: 10/10/2022 15:17    Procedures Procedures    Medications Ordered in ED Medications  sodium chloride 0.9 % bolus 500 mL (0 mLs Intravenous Stopped 10/10/22 1758)  morphine (PF) 4 MG/ML injection 4 mg (4 mg Intravenous Given 10/10/22 1711)  HYDROmorphone (DILAUDID) injection 0.5 mg (0.5 mg Intravenous Given 10/10/22 1851)    ED Course/ Medical Decision Making/ A&P                             Medical Decision Making Risk Prescription drug management.  This patient presents to the ED for concern of headache and dizziness, this involves an extensive number of treatment options, and is a complaint that carries with it a high risk of complications and morbidity.  The differential diagnosis includes migraine headache, infection, poorly controlled diabetes   Co morbidities that complicate the patient evaluation  Diabetes and headaches   Additional history obtained:  Additional history obtained from patient External records from outside  source obtained and reviewed including hospital records   Lab Tests:  I Ordered, and personally interpreted labs.  The pertinent results include: CBC and chemistries unremarkable   Imaging Studies ordered:  I ordered imaging studies including chest x-ray I independently visualized and interpreted imaging which showed negative I agree with the radiologist interpretation   Cardiac Monitoring: / EKG:  The patient was maintained on a cardiac monitor.  I personally viewed and interpreted the cardiac monitored which showed an underlying rhythm of: Normal sinus rhythm   Consultations Obtained:  No consultant  Problem List / ED Course / Critical interventions / Medication management  Headache and diabetes I ordered medication including Dilaudid for headache Reevaluation of the patient after these medicines showed that the patient improved I have reviewed the patients home medicines and have made adjustments as needed   Social Determinants of Health:  None   Test / Admission - Considered:  None  Patient with diabetes that seems under good control here in the emergency department.  He also has been having headaches that are chronic and have improved with treatment        Final Clinical Impression(s) / ED Diagnoses Final diagnoses:  Headache disorder    Rx / DC Orders ED Discharge Orders          Ordered    oxyCODONE-acetaminophen (PERCOCET) 5-325 MG tablet  Every 4 hours PRN        10/10/22 2002              Milton Ferguson, MD 10/11/22 [phone]

## 2022-10-11 ENCOUNTER — Ambulatory Visit: Payer: Medicaid Other | Admitting: Internal Medicine

## 2022-10-15 ENCOUNTER — Other Ambulatory Visit: Payer: Self-pay | Admitting: Nurse Practitioner

## 2022-10-16 ENCOUNTER — Encounter: Payer: Self-pay | Admitting: Internal Medicine

## 2022-10-16 ENCOUNTER — Ambulatory Visit: Payer: Medicaid Other | Admitting: Internal Medicine

## 2022-10-16 VITALS — BP 122/78 | HR 112 | Temp 98.3°F | Resp 16 | Ht 62.0 in | Wt 197.8 lb

## 2022-10-16 DIAGNOSIS — E538 Deficiency of other specified B group vitamins: Secondary | ICD-10-CM | POA: Diagnosis not present

## 2022-10-16 DIAGNOSIS — R350 Frequency of micturition: Secondary | ICD-10-CM

## 2022-10-16 DIAGNOSIS — G40909 Epilepsy, unspecified, not intractable, without status epilepticus: Secondary | ICD-10-CM | POA: Diagnosis not present

## 2022-10-16 DIAGNOSIS — G43811 Other migraine, intractable, with status migrainosus: Secondary | ICD-10-CM

## 2022-10-16 DIAGNOSIS — Z79899 Other long term (current) drug therapy: Secondary | ICD-10-CM | POA: Diagnosis not present

## 2022-10-16 DIAGNOSIS — K219 Gastro-esophageal reflux disease without esophagitis: Secondary | ICD-10-CM

## 2022-10-16 DIAGNOSIS — G43719 Chronic migraine without aura, intractable, without status migrainosus: Secondary | ICD-10-CM | POA: Diagnosis not present

## 2022-10-16 DIAGNOSIS — R4189 Other symptoms and signs involving cognitive functions and awareness: Secondary | ICD-10-CM | POA: Diagnosis not present

## 2022-10-16 LAB — POCT URINALYSIS DIPSTICK
Bilirubin, UA: NEGATIVE
Blood, UA: NEGATIVE
Glucose, UA: POSITIVE — AB
Ketones, UA: NEGATIVE
Nitrite, UA: NEGATIVE
Odor: NORMAL
Protein, UA: NEGATIVE
Spec Grav, UA: 1.02 (ref 1.010–1.025)
Urobilinogen, UA: 0.2 E.U./dL
pH, UA: 6.5 (ref 5.0–8.0)

## 2022-10-16 MED ORDER — OMEPRAZOLE 40 MG PO CPDR: 40.0000 mg | DELAYED_RELEASE_CAPSULE | Freq: Every day | ORAL | 1 refills | Status: DC

## 2022-10-16 NOTE — Progress Notes (Signed)
Established Patient Office Visit  Subjective   [name]: Stephanie [name], female    DOB: 01-16-1966  Age: 57 y.o. MRN: [id number]  Chief Complaint  Patient presents with   Follow-up    ER     HPI Patient is here for follow up. Since our last visit 1 month ago the patient has been seen at the ER 3 separate times. The first of which was at [facility] 09/16/22 for headache.  She does have known migraine disorder. She had a CT of her head which was negative for anything acute and she was given a nerve block for occipital neuralgia.  She returned to the ER at [facility] on 09/21/22. She was given Dilaudid, IV fluids, magnesium and Benadryl with resolution of symptoms. She seen again on 10/10/22, given pain medications and was discharged home. Today she states that her migraines are progressing and no medication works besides pain medication given in the ER. She does follow Neurology for migraines, is being seen later today.   She also thinks she may be passing a kidney stone. She denies dysuria, hematuria but has been urinating more frequently and has some suprapubic pain. No flank or abdominal pain. No fevers.   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   Bipolar disorder (HCC) 05/09/2016   Hyperlipidemia, unspecified 05/09/2016   Irritable bowel syndrome 05/09/2016   Lumbago with sciatica 05/09/2016   Esophageal reflux 05/09/2016   Scoliosis 05/09/2016   Diverticulosis of colon 05/09/2016   DDD (degenerative disc disease),  cervical 05/09/2016   ICH (intracerebral hemorrhage) (HCC)    Essential hypertension, malignant 04/19/2016   Cytotoxic brain edema (HCC) 04/19/2016   IVH (intraventricular hemorrhage) (HCC) 04/18/2016   Bilateral carpal tunnel syndrome 04/12/2016   Falls frequently 01/12/2016   Trigeminal neuralgia 06/08/2015   Sacroiliac dysfunction 03/04/2014   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 or radiculopathy 05/20/2013   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   Tobacco use disorder 09/11/2012   History of cervical cancer 07/30/2012   Hepatitis C    Past Medical History:  Diagnosis Date   Closed fracture of shaft of humerus 01/20/2017   Diabetes mellitus without complication (HCC)    Difficult intubation    Diverticulitis    Headache    Hepatitis C    treated and resolved   History of kidney stones    Hypertension    Hypotension 08/22/2018   IBS (irritable bowel syndrome)    Kidney stones    Sciatica    Sleep apnea    on cpap   Stroke (HCC) 2017   memory loss   Superficial venous thrombosis of arm, left 07/22/2016   Past Surgical History:  Procedure Laterality Date   ABDOMINAL HYSTERECTOMY  2007   carpel tunnel syndrome  2017   HARDWARE REMOVAL Right 12/22/2017   Procedure: HARDWARE REMOVAL-RIGHT ARM;  Surgeon: Lyndle Herrlich, MD;  Location: [facility] ORS;  Service: Orthopedics;  Laterality: Right;  Removal of implant, superficial right humerus   HUMERUS IM NAIL Right 03/11/2017  Procedure: INTRAMEDULLARY (IM) NAIL HUMERAL;  Surgeon: Lovell Sheehan, MD;  Location: [facility] ORS;  Service: Orthopedics;  Laterality: Right;   KIDNEY STONE SURGERY Right 02/12/2012   KIDNEY STONE SURGERY Left 07/15/2012   LIGATION OF ARTERIOVENOUS  FISTULA Left 06/21/2016   Procedure: LIGATION OF ARTERIOVENOUS  FISTULA ( LIGATION BASILIC VEIN );  Surgeon: Katha Cabal, MD;  Location: [facility] ORS;  Service: Vascular;  Laterality: Left;   LITHOTRIPSY     OOPHORECTOMY     SINUS  EXPLORATION     TONSILLECTOMY     Social History   Tobacco Use   Smoking status: Every Day    Packs/day: 1.00    Years: 1.50    Total pack years: 1.50    Types: Cigarettes   Smokeless tobacco: Never  Vaping Use   Vaping Use: Never used  Substance Use Topics   Alcohol use: Not Currently    Comment: no alcohol since 2002   Drug use: No   Social History   Socioeconomic History   Marital status: Legally Separated    Spouse name: Not on file   Number of children: Not on file   Years of education: Not on file   Highest education level: Not on file  Occupational History   Not on file  Tobacco Use   Smoking status: Every Day    Packs/day: 1.00    Years: 1.50    Total pack years: 1.50    Types: Cigarettes   Smokeless tobacco: Never  Vaping Use   Vaping Use: Never used  Substance and Sexual Activity   Alcohol use: Not Currently    Comment: no alcohol since 2002   Drug use: No   Sexual activity: Not on file  Other Topics Concern   Not on file  Social History Narrative   Not on file   Social Determinants of Health   Financial Resource Strain: Not on file  Food Insecurity: No Food Insecurity (05/08/2022)   Hunger Vital Sign    Worried About Running Out of Food in the Last Year: Never true    Ran Out of Food in the Last Year: Never true  Transportation Needs: Unmet Transportation Needs (10/01/2022)   PRAPARE - Hydrologist (Medical): Yes    Lack of Transportation (Non-Medical): No  Physical Activity: Not on file  Stress: Not on file  Social Connections: Not on file  Intimate Partner Violence: Not on file   Family Status  Relation Name Status   Mother  Deceased   Father  Alive   Sister  Deceased   Family History  Problem Relation Age of Onset   Heart failure Mother    Diabetes Father    Cancer Sister    Allergies  Allergen Reactions   Gabapentin Itching   Ibuprofen Itching   Sulfa Antibiotics Hives   Tylenol [Acetaminophen]  Itching   Aspirin Itching   Buprenorphine Hcl Itching   Carbamazepine Itching, Other (See Comments) and Rash    Makes her feel like something is crawling under her skin.   Codeine Itching and Other (See Comments)    Makes her feel like something is crawling under her skin. Can take, sometimes makes her itch   Compazine Itching and Other (See Comments)    "makes my skin crawl"   Elemental Sulfur Hives, Rash and Other (See Comments)    Skin Rashes, Hives   Ioxaglate Itching   Ivp Dye [Iodinated Contrast Media] Itching and Other (See  Comments)    Makes her itch really bad. Had to get 2-3 shots of Benadryl when she was in the hospital.   Metrizamide Itching   Naproxen Itching   Norco [Hydrocodone-Acetaminophen] Itching   Other Itching and Other (See Comments)    Makes her itch really bad. Had to get 2-3 shots of Benadryl when she was in the hospital.   Penicillin G Hives, Rash and Other (See Comments)    Has patient had a PCN reaction causing immediate rash, facial/tongue/throat swelling, SOB or lightheadedness with hypotension: Yes Has patient had a PCN reaction causing severe rash involving mucus membranes or skin necrosis: No Has patient had a PCN reaction that required hospitalization: No Has patient had a PCN reaction occurring within the last 10 years: No If all of the above answers are "NO", then may proceed with Cephalosporin use.    Prochlorperazine Maleate Other (See Comments)    Compazine makes her skin crawl - needs 2-3 shots of Benadryl to get relief.   Reglan [Metoclopramide] Itching and Other (See Comments)    Makes her skin crawl   Toradol [Ketorolac Tromethamine] Itching   Tramadol Itching   Zofran [Ondansetron Hcl] Itching    Review of Systems  Constitutional:  Negative for chills and fever.  Respiratory:  Negative for cough, shortness of breath and wheezing.   Cardiovascular:  Negative for chest pain.  Gastrointestinal:  Negative for abdominal pain, heartburn,  nausea and vomiting.  Genitourinary:  Positive for frequency and urgency. Negative for dysuria, flank pain and hematuria.      Objective:     BP 122/78   Pulse (!) 112   Temp 98.3 F (36.8 C)   Resp 16   Ht 5\' 2"  (1.575 m)   Wt 197 lb 12.8 oz (89.7 kg)   SpO2 92%   BMI 36.18 kg/m  BP Readings from Last 3 Encounters:  10/16/22 122/78  10/10/22 121/68  09/21/22 120/73   Wt Readings from Last 3 Encounters:  10/16/22 197 lb 12.8 oz (89.7 kg)  10/10/22 184 lb (83.5 kg)  09/21/22 195 lb (88.5 kg)     Physical Exam HENT:     Head: Normocephalic and atraumatic.  Eyes:     Conjunctiva/sclera: Conjunctivae normal.  Cardiovascular:     Rate and Rhythm: Normal rate and regular rhythm.  Pulmonary:     Effort: Pulmonary effort is normal.     Breath sounds: Normal breath sounds.  Abdominal:     Tenderness: There is no right CVA tenderness or left CVA tenderness.  Skin:    General: Skin is warm and dry.     Comments: Hematoma on scalp   Neurological:     Mental Status: Mental status is at baseline.  Psychiatric:        Mood and Affect: Mood normal.        Behavior: Behavior normal.     No results found for any visits on 10/16/22.   Last CBC Lab Results  Component Value Date   WBC 8.3 10/10/2022   HGB 14.2 10/10/2022   HCT 43.3 10/10/2022   MCV 96.0 10/10/2022   MCH 31.5 10/10/2022   RDW 13.9 10/10/2022   PLT 194 00/17/4944   Last metabolic panel Lab Results  Component Value Date   GLUCOSE 100 (H) 10/10/2022   NA 141 10/10/2022   K 3.6 10/10/2022   CL 99 10/10/2022   CO2 31 10/10/2022   BUN 18 10/10/2022   CREATININE 0.90 10/10/2022   GFRNONAA >  60 10/10/2022   CALCIUM 9.4 10/10/2022   PHOS 2.3 (L) 08/22/2018   PROT 8.0 10/10/2022   ALBUMIN 4.1 10/10/2022   BILITOT 0.5 10/10/2022   ALKPHOS 96 10/10/2022   AST 22 10/10/2022   ALT 19 10/10/2022   ANIONGAP 11 10/10/2022   Last lipids Lab Results  Component Value Date   CHOL 123 02/13/2022   HDL 73  02/13/2022   LDLCALC 35 02/13/2022   TRIG 69 02/13/2022   CHOLHDL 1.7 02/13/2022   Last hemoglobin A1c Lab Results  Component Value Date   HGBA1C 7.2 (A) 09/10/2022   Last thyroid functions Lab Results  Component Value Date   TSH 1.218 04/16/2022   Last vitamin D No results found for: "25OHVITD2", "25OHVITD3", "VD25OH" Last vitamin B12 and Folate Lab Results  Component Value Date   DHRCBULA45 364 02/20/2022      The ASCVD Risk score (Arnett DK, et al., 2019) failed to calculate for the following reasons:   The patient has a prior MI or stroke diagnosis    Assessment & Plan:   1. Increased urinary frequency: UA today with leukocytes, will send for culture.   - POCT Urinalysis Dipstick - Urine Culture  2. Other migraine with status migrainosus, intractable: No symptoms currently, is following with Neurology later today. ER notes, labs and imaging from 09/16/22, 09/21/22, and 10/10/22 reviewed.   3. Gastroesophageal reflux disease, unspecified whether esophagitis present: Refill Prilosec today.   - omeprazole (PRILOSEC) 40 MG capsule; Take 1 capsule (40 mg total) by mouth daily.  Dispense: 90 capsule; Refill: 1   Return for already scheduled.    Teodora Medici, DO

## 2022-10-16 NOTE — Patient Instructions (Signed)
It was great seeing you today!  Plan discussed at today's visit: -Follow up with Neurology for headaches  -Urine test today -Omeprazole refilled   Follow up in: April, already scheduled   Take care and let us know if you have any questions or concerns prior to your next visit.  Dr. Rosana Berger

## 2022-10-17 LAB — URINE CULTURE
MICRO NUMBER:: 14532537
Result:: NO GROWTH
SPECIMEN QUALITY:: ADEQUATE

## 2022-10-21 ENCOUNTER — Telehealth: Payer: Self-pay | Admitting: Internal Medicine

## 2022-10-21 ENCOUNTER — Other Ambulatory Visit: Payer: Self-pay | Admitting: Internal Medicine

## 2022-10-21 DIAGNOSIS — K219 Gastro-esophageal reflux disease without esophagitis: Secondary | ICD-10-CM

## 2022-10-21 NOTE — Telephone Encounter (Signed)
Pt lost her meter and needs another freestyle Libre 2 / please advise   [facility] [address], [place]

## 2022-10-21 NOTE — Telephone Encounter (Signed)
Medication Refill - Medication: omeprazole (PRILOSEC) 40 MG capsule   Pt stated the pharmacy advise her they do not have her medication. Please advise.   Class: Phone In   Has the patient contacted their pharmacy? Yes.    (Agent: If yes, when and what did the pharmacy advise?)  Preferred Pharmacy (with phone number or street name):  [place], [place] - [address]  [place] [address]  Phone: [phone] Fax: [phone]  Hours: Not open 24 hours   Has the patient been seen for an appointment in the last year OR does the patient have an upcoming appointment? Yes.    Agent: Please be advised that RX refills may take up to 3 business days. We ask that you follow-up with your pharmacy.

## 2022-10-22 ENCOUNTER — Other Ambulatory Visit: Payer: Self-pay | Admitting: Internal Medicine

## 2022-10-22 DIAGNOSIS — E1165 Type 2 diabetes mellitus with hyperglycemia: Secondary | ICD-10-CM

## 2022-10-22 MED ORDER — FREESTYLE LIBRE 2 READER DEVI: 1.0000 | 4 refills | Status: DC

## 2022-10-22 MED ORDER — BLOOD GLUCOSE METER KIT: PACK | 0 refills | Status: DC

## 2022-10-22 MED ORDER — FREESTYLE LIBRE 2 SENSOR MISC: 1.0000 | 4 refills | Status: DC

## 2022-10-22 MED ORDER — OMEPRAZOLE 40 MG PO CPDR: 40.0000 mg | DELAYED_RELEASE_CAPSULE | Freq: Every day | ORAL | 1 refills | Status: DC

## 2022-10-22 NOTE — Telephone Encounter (Signed)
Requested Prescriptions  Pending Prescriptions Disp Refills   omeprazole (PRILOSEC) 40 MG capsule 90 capsule 1    Sig: Take 1 capsule (40 mg total) by mouth daily.     Gastroenterology: Proton Pump Inhibitors Passed - 10/21/2022  3:01 PM      Passed - Valid encounter within last 12 months    Recent Outpatient Visits           6 days ago Increased urinary frequency   [facility] Teodora Medici, DO   1 month ago Type 2 diabetes mellitus with hyperglycemia, without long-term current use of insulin [facility])   [facility] Teodora Medici, DO   2 months ago Essential hypertension, malignant   [facility] Teodora Medici, DO   3 months ago Type 2 diabetes mellitus with hyperglycemia, without long-term current use of insulin [facility])   [facility] Bo Merino, FNP   4 months ago Diarrhea, unspecified type   [facility] Teodora Medici, DO       Future Appointments             In 1 month Teodora Medici, [facility], [facility]

## 2022-10-22 NOTE — Telephone Encounter (Signed)
faxed

## 2022-10-22 NOTE — Telephone Encounter (Signed)
Pt called back, I see that a meter has been approved however it has been printed. She needs it electronically submitted to [facility] on [address] in [place]

## 2022-10-23 ENCOUNTER — Other Ambulatory Visit: Payer: Self-pay

## 2022-10-23 ENCOUNTER — Ambulatory Visit
Admission: RE | Admit: 2022-10-23 | Discharge: 2022-10-23 | Disposition: A | Discharge status: Home / Self Care | Payer: 59 | Source: Ambulatory Visit | Attending: Acute Care | Admitting: Acute Care

## 2022-10-23 ENCOUNTER — Other Ambulatory Visit: Payer: Self-pay | Admitting: Student

## 2022-10-23 ENCOUNTER — Ambulatory Visit (INDEPENDENT_AMBULATORY_CARE_PROVIDER_SITE_OTHER): Payer: 59 | Admitting: Primary Care

## 2022-10-23 ENCOUNTER — Encounter: Payer: Self-pay | Admitting: Primary Care

## 2022-10-23 DIAGNOSIS — Z87891 Personal history of nicotine dependence: Secondary | ICD-10-CM | POA: Diagnosis not present

## 2022-10-23 DIAGNOSIS — G43719 Chronic migraine without aura, intractable, without status migrainosus: Secondary | ICD-10-CM

## 2022-10-23 DIAGNOSIS — Z122 Encounter for screening for malignant neoplasm of respiratory organs: Secondary | ICD-10-CM

## 2022-10-23 NOTE — Patient Instructions (Signed)
Thank you for participating in the [facility]. It was our pleasure to meet you today. We will call you with the results of your scan within the next few days. Your scan will be assigned a Lung RADS category score by the physicians reading the scans.  This Lung RADS score determines follow up scanning.  See below for description of categories, and follow up screening recommendations. We will be in touch to schedule your follow up screening annually or based on recommendations of our providers. We will fax a copy of your scan results to your Primary Care Physician, or the physician who referred you to the program, to ensure they have the results. Please call the office if you have any questions or concerns regarding your scanning experience or results.  Our office number is [phone]. Please speak with Denise Phelps, RN. , or  Denise Buckner RN, They are  our Lung Cancer Screening RN.'s If They are unavailable when you call, Please leave a message on the voice mail. We will return your call at our earliest convenience.This voice mail is monitored several times a day.  Remember, if your scan is normal, we will scan you annually as long as you continue to meet the criteria for the program. (Age 50-80, Current smoker or smoker who has quit within the last 15 years). If you are a smoker, remember, quitting is the single most powerful action that you can take to decrease your risk of lung cancer and other pulmonary, breathing related problems. We know quitting is hard, and we are here to help.  Please let us know if there is anything we can do to help you meet your goal of quitting. If you are a former smoker, congratulations. We are proud of you! Remain smoke free! Remember you can refer friends or family members through the number above.  We will screen them to make sure they meet criteria for the program. Thank you for helping us take better care of you by  participating in Lung Screening.  You can receive free nicotine replacement therapy ( patches, gum or mints) by calling 1-800-QUIT NOW. Please call so we can get you on the path to becoming  a non-smoker. I know it is hard, but you can do this!  Lung RADS Categories:  Lung RADS 1: no nodules or definitely non-concerning nodules.  Recommendation is for a repeat annual scan in 12 months.  Lung RADS 2:  nodules that are non-concerning in appearance and behavior with a very low likelihood of becoming an active cancer. Recommendation is for a repeat annual scan in 12 months.  Lung RADS 3: nodules that are probably non-concerning , includes nodules with a low likelihood of becoming an active cancer.  Recommendation is for a 6-month repeat screening scan. Often noted after an upper respiratory illness. We will be in touch to make sure you have no questions, and to schedule your 6-month scan.  Lung RADS 4 A: nodules with concerning findings, recommendation is most often for a follow up scan in 3 months or additional testing based on our provider's assessment of the scan. We will be in touch to make sure you have no questions and to schedule the recommended 3 month follow up scan.  Lung RADS 4 B:  indicates findings that are concerning. We will be in touch with you to schedule additional diagnostic testing based on our provider's  assessment of the scan.  Other options for assistance in smoking cessation (   As covered by your insurance benefits)  Hypnosis for smoking cessation  [employer] [phone]  Acupuncture for smoking cessation  [facility] [phone]   

## 2022-10-23 NOTE — Progress Notes (Addendum)
Virtual televisit Note  I connected with Stephanie [name] on 10/23/22 at  8:30 AM EST by a telephone enabled telemedicine application and verified that I am speaking with the correct person using two identifiers.  Location: Patient: Home Provider: Office   I discussed the limitations of evaluation and management by telemedicine and the availability of in person appointments. The patient expressed understanding and agreed to proceed.   Shared Decision Making Visit Lung Cancer Screening Program [phone])   Eligibility: Age 57 y.o. Pack Years Smoking History Calculation 30 (# packs/per year x # years smoked) Recent History of coughing up blood  no Unexplained weight loss? no ( >Than 15 pounds within the last 6 months ) Prior History Lung / other cancer no (Diagnosis within the last 5 years already requiring surveillance chest CT Scans). Smoking Status Former Smoker Former Smokers: Years since quit: < 1 year  Quit Date: 2023  Visit Components: Discussion included one or more decision making aids. yes Discussion included risk/benefits of screening. yes Discussion included potential follow up diagnostic testing for abnormal scans. yes Discussion included meaning and risk of over diagnosis. yes Discussion included meaning and risk of False Positives. yes Discussion included meaning of total radiation exposure. yes  Counseling Included: Importance of adherence to annual lung cancer LDCT screening. yes Impact of comorbidities on ability to participate in the program. yes Ability and willingness to under diagnostic treatment. yes  Smoking Cessation Counseling: Current Smokers:  Discussed importance of smoking cessation. yes Information about tobacco cessation classes and interventions provided to patient. yes Patient provided with "ticket" for LDCT Scan. NA Symptomatic Patient. no  Counseling(Intermediate counseling: > three minutes) [id number] Diagnosis Code: Tobacco Use  Z72.0 Asymptomatic Patient yes  Counseling (Intermediate counseling: > three minutes counseling) [id number] Former Smokers:  Discussed the importance of maintaining cigarette abstinence. yes Diagnosis Code: Personal History of Nicotine Dependence. [id number] Information about tobacco cessation classes and interventions provided to patient. Yes Patient provided with "ticket" for LDCT Scan. NA Written Order for Lung Cancer Screening with LDCT placed in Epic. Yes (CT Chest Lung Cancer Screening Low Dose W/O CM) [id number] Z12.2-Screening of respiratory organs Z87.891-Personal history of nicotine dependence  I have spent 25 minutes of face to face/ virtual visit   time with Stephanie [name] discussing the risks and benefits of lung cancer screening. We viewed / discussed a power point together that explained in detail the above noted topics. We paused at intervals to allow for questions to be asked and answered to ensure understanding.We discussed that the single most powerful action that she can take to decrease her risk of developing lung cancer is to quit smoking. We discussed whether or not she is ready to commit to setting a quit date. We discussed options for tools to aid in quitting smoking including nicotine replacement therapy, non-nicotine medications, support groups, Quit Smart classes, and behavior modification. We discussed that often times setting smaller, more achievable goals, such as eliminating 1 cigarette a day for a week and then 2 cigarettes a day for a week can be helpful in slowly decreasing the number of cigarettes smoked. This allows for a sense of accomplishment as well as providing a clinical benefit. I provided  her  with smoking cessation  information  with contact information for community resources, classes, free nicotine replacement therapy, and access to mobile apps, text messaging, and on-line smoking cessation help. I have also provided  her  the office contact information in the event  she needs to contact  me, or the screening staff. We discussed the time and location of the scan, and that either Doroteo Glassman RN, Joella Prince, RN  or I will call / send a letter with the results within 24-72 hours of receiving them. The patient verbalized understanding of all of  the above and had no further questions upon leaving the office. They have my contact information in the event they have any further questions.  I spent 3-5 minutes counseling on smoking cessation and the health risks of continued tobacco abuse.  I explained to the patient that there has been a high incidence of coronary artery disease noted on these exams. I explained that this is a non-gated exam therefore degree or severity cannot be determined. This patient is not on statin therapy. I have asked the patient to follow-up with their PCP regarding any incidental finding of coronary artery disease and management with diet or medication as their PCP  feels is clinically indicated. The patient verbalized understanding of the above and had no further questions upon completion of the visit.     Martyn Ehrich, NP

## 2022-10-24 DIAGNOSIS — F319 Bipolar disorder, unspecified: Secondary | ICD-10-CM | POA: Diagnosis not present

## 2022-10-24 DIAGNOSIS — F4312 Post-traumatic stress disorder, chronic: Secondary | ICD-10-CM | POA: Diagnosis not present

## 2022-10-24 DIAGNOSIS — F411 Generalized anxiety disorder: Secondary | ICD-10-CM | POA: Diagnosis not present

## 2022-10-24 DIAGNOSIS — F410 Panic disorder [episodic paroxysmal anxiety] without agoraphobia: Secondary | ICD-10-CM | POA: Diagnosis not present

## 2022-10-25 ENCOUNTER — Other Ambulatory Visit: Payer: Self-pay | Admitting: Nurse Practitioner

## 2022-10-25 DIAGNOSIS — E1165 Type 2 diabetes mellitus with hyperglycemia: Secondary | ICD-10-CM

## 2022-10-29 DIAGNOSIS — Z8673 Personal history of transient ischemic attack (TIA), and cerebral infarction without residual deficits: Secondary | ICD-10-CM | POA: Diagnosis not present

## 2022-10-29 DIAGNOSIS — G43719 Chronic migraine without aura, intractable, without status migrainosus: Secondary | ICD-10-CM | POA: Diagnosis not present

## 2022-10-30 ENCOUNTER — Ambulatory Visit
Admission: RE | Admit: 2022-10-30 | Discharge: 2022-10-30 | Disposition: A | Discharge status: Home / Self Care | Payer: Medicaid Other | Source: Ambulatory Visit | Attending: Student | Admitting: Student

## 2022-10-30 DIAGNOSIS — G43719 Chronic migraine without aura, intractable, without status migrainosus: Secondary | ICD-10-CM | POA: Insufficient documentation

## 2022-10-30 DIAGNOSIS — G43909 Migraine, unspecified, not intractable, without status migrainosus: Secondary | ICD-10-CM | POA: Diagnosis not present

## 2022-10-30 MED ORDER — GADOBUTROL 1 MMOL/ML IV SOLN
7.5000 mL | Freq: Once | INTRAVENOUS | Status: AC | PRN
  Administered 2022-10-30: 7.5 mL via INTRAVENOUS

## 2022-10-31 ENCOUNTER — Other Ambulatory Visit: Payer: Medicaid Other | Admitting: *Deleted

## 2022-10-31 NOTE — Patient Outreach (Signed)
  Medicaid Managed Care   Unsuccessful Attempt Note   10/31/2022 Name: Stephanie [name] MRN: [id number] DOB: [birth year]  Referred by: Teodora Medici, DO Reason for referral : High Risk Managed Medicaid (Unsuccessful RNCM follow up outreach)   An unsuccessful telephone outreach was attempted today. The patient was referred to the case management team for assistance with care management and care coordination.    Follow Up Plan: The Managed Medicaid care management team will reach out to the patient again over the next 7 days.    Lurena Joiner RN, BSN [place]  [employer] manager

## 2022-10-31 NOTE — Patient Instructions (Signed)
Visit Information  [name]  - as a part of your Medicaid benefit, you are eligible for care management and care coordination services at no cost or copay. I was unable to reach you by phone today but would be happy to help you with your health related needs. Please feel free to call me @ [phone].   A member of the Managed Medicaid care management team will reach out to you again over the next 7 days.   Lurena Joiner RN, BSN [place]  [employer] manager

## 2022-11-07 ENCOUNTER — Other Ambulatory Visit: Payer: Self-pay

## 2022-11-08 DIAGNOSIS — Z419 Encounter for procedure for purposes other than remedying health state, unspecified: Secondary | ICD-10-CM | POA: Diagnosis not present

## 2022-11-12 ENCOUNTER — Encounter: Payer: Self-pay | Admitting: *Deleted

## 2022-11-12 ENCOUNTER — Other Ambulatory Visit: Payer: Medicaid Other | Admitting: *Deleted

## 2022-11-12 ENCOUNTER — Telehealth: Payer: Self-pay | Admitting: Internal Medicine

## 2022-11-12 NOTE — Telephone Encounter (Signed)
Patient called back and stated that she went to [facility] a little of a week ago and the neurologist at [facility] told her she had kidney stones and that her pcp would have to refer patient to urology.

## 2022-11-12 NOTE — Patient Outreach (Signed)
Medicaid Managed Care   Nurse Care Manager Note  11/12/2022 Name:  Stephanie [name] MRN:  [id number] DOB:  [birth year]  Stephanie [name] is an 57 y.o. year old female who is a primary patient of Teodora Medici, DO.  The [facility] Managed Care Coordination team was consulted for assistance with:    DMII  Stroke history  [name] was given information about Medicaid Managed Care Coordination team services today. Stephanie [name] Patient agreed to services and verbal consent obtained.  Engaged with patient by telephone for follow up visit in response to provider referral for case management and/or care coordination services.   Assessments/Interventions:  Review of past medical history, allergies, medications, health status, including review of consultants reports, laboratory and other test data, was performed as part of comprehensive evaluation and provision of chronic care management services.  SDOH (Social Determinants of Health) assessments and interventions performed: SDOH Interventions    Flowsheet Row Patient Outreach Telephone from 11/12/2022 in [place] Patient Outreach Telephone from 10/01/2022 in [place] Patient Outreach Telephone from 08/14/2022 in [place] Patient Outreach Telephone from 05/08/2022 in [place] Coordination Office Visit from 03/29/2022 in [facility]  SDOH Interventions       Food Insecurity Interventions Intervention Not Indicated -- -- Intervention Not Indicated --  Housing Interventions -- Intervention Not Indicated -- Intervention Not Indicated --  Transportation Interventions Intervention Not Indicated Intervention Not Indicated -- Other (Comment)  [Provided with [employer] transportation] --  Utilities Interventions -- -- Intervention Not Indicated -- --  Depression Interventions/Treatment  -- -- -- --  Medication       Care Plan  Allergies  Allergen Reactions   Gabapentin Itching   Ibuprofen Itching   Sulfa Antibiotics Hives   Tylenol [Acetaminophen] Itching   Aspirin Itching   Buprenorphine Hcl Itching   Carbamazepine Itching, Other (See Comments) and Rash    Makes her feel like something is crawling under her skin.   Codeine Itching and Other (See Comments)    Makes her feel like something is crawling under her skin. Can take, sometimes makes her itch   Compazine Itching and Other (See Comments)    "makes my skin crawl"   Elemental Sulfur Hives, Rash and Other (See Comments)    Skin Rashes, Hives   Ioxaglate Itching   Ivp Dye [Iodinated Contrast Media] Itching and Other (See Comments)    Makes her itch really bad. Had to get 2-3 shots of Benadryl when she was in the hospital.   Metrizamide Itching   Naproxen Itching   Norco [Hydrocodone-Acetaminophen] Itching   Other Itching and Other (See Comments)    Makes her itch really bad. Had to get 2-3 shots of Benadryl when she was in the hospital.   Penicillin G Hives, Rash and Other (See Comments)    Has patient had a PCN reaction causing immediate rash, facial/tongue/throat swelling, SOB or lightheadedness with hypotension: Yes Has patient had a PCN reaction causing severe rash involving mucus membranes or skin necrosis: No Has patient had a PCN reaction that required hospitalization: No Has patient had a PCN reaction occurring within the last 10 years: No If all of the above answers are "NO", then may proceed with Cephalosporin use.    Prochlorperazine Maleate Other (See Comments)    Compazine makes her skin crawl - needs 2-3 shots of Benadryl to get relief.   Reglan [Metoclopramide] Itching  and Other (See Comments)    Makes her skin crawl   Toradol [Ketorolac Tromethamine] Itching   Tramadol Itching   Zofran [Ondansetron Hcl] Itching    Medications Reviewed Today     Reviewed by Melissa Montane, RN (Registered  Nurse) on 11/12/22 at 1158  Med List Status: <None>   Medication Order Taking? Sig Documenting Provider Last Dose Status Informant  albuterol (VENTOLIN HFA) 108 (90 Base) MCG/ACT inhaler [id number] Yes Inhale 2 puffs into the lungs every 6 (six) hours as needed for wheezing or shortness of breath. Myles Gip, DO Taking Active Self, Multiple Informants  ARIPiprazole (ABILIFY) 15 MG tablet [id number] Yes Take 15 mg by mouth every morning. [provider] Taking Active Self, Multiple Informants  blood glucose meter kit and supplies [id number]  Dispense based on patient and insurance preference. Use up to four times daily as directed. (FOR ICD-10 E10.9, E11.9). Teodora Medici, DO  Active   CAPLYTA 42 MG capsule [id number] Yes Take 42 mg by mouth every evening. [provider] Taking Active Self, Multiple Informants  clopidogrel (PLAVIX) 75 MG tablet [id number] Yes Take 75 mg by mouth daily. [provider] Taking Active Self, Multiple Informants  Continuous Blood Gluc Receiver (FREESTYLE LIBRE 2 READER) DEVI [id number] Yes USE TO CHECK SUGARS DAILY Evern Bio, NP Taking Active   Continuous Blood Gluc Sensor (FREESTYLE LIBRE 2 SENSOR) [place] [id number] Yes 1 each by Does not apply route every 14 (fourteen) days. Teodora Medici, DO Taking Active   diazepam (VALIUM) 10 MG tablet [id number] Yes Take 10 mg by mouth 3 (three) times daily. [provider] Taking Active Self, Multiple Informants           Med Note Duffy Bruce, Para [place] Oct 10, 2022  6:19 PM)    fluticasone-salmeterol (ADVAIR) 100-50 MCG/ACT AEPB [id number] Yes Inhale 1 puff into the lungs 2 (two) times daily. Teodora Medici, DO Taking Active   furosemide (LASIX) 40 MG tablet [id number] Yes Take 1 tablet (40 mg total) by mouth 2 (two) times daily as needed for edema (take 40 mg in the morning, can take additional 40 mg in afternoon if needed for edema). Teodora Medici, DO Taking Active    lamoTRIgine (LAMICTAL) 100 MG tablet [id number] Yes Take 100 mg by mouth 2 (two) times daily. [provider] Taking Active   metFORMIN (GLUCOPHAGE-XR) 500 MG 24 hr tablet [id number] Yes Take 1 tablet (500 mg total) by mouth 2 (two) times daily with a meal. Teodora Medici, DO Taking Active   omeprazole (PRILOSEC) 40 MG capsule [id number] Yes Take 1 capsule (40 mg total) by mouth daily. Teodora Medici, DO Taking Active   oxyCODONE-acetaminophen (PERCOCET) 5-325 MG tablet [id number] No Take 2 tablets by mouth every 4 (four) hours as needed.  Patient not taking: Reported on 11/12/2022   Milton Ferguson, MD Not Taking Active            Med Note (Diaz Crago A   Tue Nov 12, 2022 11:57 AM) Ran out  pioglitazone (ACTOS) 15 MG tablet [id number] Yes Take 1 tablet by mouth once daily Teodora Medici, DO Taking Active   potassium chloride SA (KLOR-CON M20) 20 MEQ tablet [id number] Yes TAKE 1 TABLET BY MOUTH TWICE DAILY FOR THREE DAYS. THEN TAKE ONE TABLET DAILY WITH Ruby Cola, DO Taking Active   prazosin (MINIPRESS) 2 MG capsule [id number] Yes Take 4 mg by mouth every evening. [provider] Taking Active Self, Multiple Informants  traZODone (DESYREL) 150  MG tablet [id number] Yes Take by mouth at bedtime. [provider] Taking Active   Vilazodone HCl (VIIBRYD) 40 MG TABS [id number] Yes Take 40 mg by mouth daily. [provider] Taking Active   Med List Note Brunilda Payor, CPhT 04/17/22 1014): Augustina Mood Friend   [id number]             Patient Active Problem List   Diagnosis Date Noted   Hypokalemia 04/17/2022   Leg swelling 02/13/2022   Wheezing 02/13/2022   Migraine 05/12/2020   Type 2 diabetes mellitus ([place]) 08/22/2018   Other specified cardiac dysrhythmias 08/22/2018   Acute encephalopathy 08/21/2018   Kidney stone 03/10/2018   UTI (urinary tract infection) 08/21/2017   History of CVA (cerebrovascular accident) 01/28/2017   History  of migraine 01/28/2017   Small bowel obstruction ([place]) 09/02/2016   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   Bipolar disorder ([place]) 05/09/2016   Hyperlipidemia, unspecified 05/09/2016   Irritable bowel syndrome 05/09/2016   Lumbago with sciatica 05/09/2016   Esophageal reflux 05/09/2016   Scoliosis 05/09/2016   Diverticulosis of colon 05/09/2016   DDD (degenerative disc disease), cervical 05/09/2016   ICH (intracerebral hemorrhage) ([place])    Essential hypertension, malignant 04/19/2016   Cytotoxic brain edema ([place]) 04/19/2016   IVH (intraventricular hemorrhage) ([place]) 04/18/2016   Bilateral carpal tunnel syndrome 04/12/2016   Falls frequently 01/12/2016   Trigeminal neuralgia 06/08/2015   Sacroiliac dysfunction 03/04/2014   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 or radiculopathy 05/20/2013   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   Tobacco use disorder 09/11/2012   History of cervical cancer 07/30/2012   Hepatitis C     Conditions to be addressed/monitored per PCP order:  DMII and Stroke History  Care Plan : RN Care Manager Plan of Care  Updates made by Melissa Montane, RN since 11/12/2022 12:00 AM     Problem: Development of Plan of Care to address Health Management needs related to Stroke history      Long-Range Goal: Development of Plan of Care to address Health Management needs related to Stroke history   Start Date: 05/08/2022  Expected End Date: 01/13/2023  Priority: High  Note:   Current Barriers:  Chronic Disease Management support and education needs related to Improving Health with history of Stroke Patient seen at [facility] on 10/29/22 and will see Dr. Manuella Ghazi on 11/13/22 for plan of care to treat recurrent headaches. She recently lost her dog of 18 yrs, she has therapy scheduled on 12/11/22 @ 9am.  RNCM Clinical Goal(s):  Patient will verbalize understanding of plan for management of Improving Health with history of stroke as evidenced by patient  reports attend all scheduled medical appointments: 10/09/22 with PCP and Neurology, 10/23/22 with Pulmonology and CT for lung cancer screening as evidenced by provider documentation in EMR        continue to work with RN Care Manager and/or Social Worker to address care management and care coordination needs related to improving health with history of stroke as evidenced by adherence to CM Team Scheduled appointments     through collaboration with Consulting civil engineer, provider, and care team.   Interventions: Inter-disciplinary care team collaboration (see longitudinal plan of care) Evaluation of current treatment plan related to  self management and patient's adherence to plan as established by provider Reviewed upcoming appointments including: PCP on 10/09/22 and Neurology on 10/09/22, 10/23/22 with Pulmonology and Lung CT   Diabetes Interventions:  (Status:  New goal.) Long Term Goal Assessed  patient's understanding of A1c goal: <7% Reviewed medications with patient and discussed importance of medication adherence Counseled on importance of regular laboratory monitoring as prescribed Discussed plans with patient for ongoing care management follow up and provided patient with direct contact information for care management team Reviewed scheduled/upcoming provider appointments including: 10/09/22 with PCP and Neurology and 10/23/22 with [facility] for Lung Cancer Screening Assessed social determinant of health barriers Provided encouragement as patient works to lower her A1C Advised patient continue to work on increasing exercise and improving diet  Lab Results  Component Value Date   HGBA1C 7.2 (A) 09/10/2022   Stroke:  (Status:Goal on track:  Yes.) Long Term Goal Reviewed Importance of taking all medications as prescribed Reviewed Importance of attending all scheduled provider appointments Advised to report any changes in symptoms or exercise tolerance Assessed social determinant of  health barriers Reviewed the importance of exercise Reviewed recent Neurology note Encouraged patient to continue to exercise at home(stationary bike) Advised patient to attend Neurology appointment on 11/13/22 for plan of care-schedule Vyepti infusions Advised patient to make sure she is drinking 6-8 glasses of water a day to improve hydration  Patient Goals/Self-Care Activities: Take medications as prescribed   Attend all scheduled provider appointments Call provider office for new concerns or questions        Follow Up:  Patient agrees to Care Plan and Follow-up.  Plan: The Managed Medicaid care management team will reach out to the patient again over the next 60 days.  Date/time of next scheduled RN care management/care coordination outreach:  01/13/23 @ 10:30am  Lurena Joiner RN, BSN [place] RN Care Coordinator

## 2022-11-12 NOTE — Telephone Encounter (Signed)
Called with no answer. Left voicemail for callback to figure out where she went.

## 2022-11-12 NOTE — Telephone Encounter (Signed)
Copied from [place] [phone]. Topic: Referral - Request for Referral >> Nov 12, 2022  1:29 PM Sabas Sous wrote: Has patient seen PCP for this complaint? No. *If NO, is insurance requiring patient see PCP for this issue before PCP can refer them? Referral for which specialty: Urology  Preferred provider/office: [place]  Reason for referral: Pt states she has kidney stones, she says she just found out at the ED

## 2022-11-12 NOTE — Patient Instructions (Signed)
Visit Information  Stephanie [name] was given information about Medicaid Managed Care team care coordination services as a part of their [facility] Medicaid benefit. Stephanie [name] verbally consented to engagement with the [facility] Managed Care team.   If you are experiencing a medical emergency, please call 911 or report to your local emergency department or urgent care.   If you have a non-emergency medical problem during routine business hours, please contact your provider's office and ask to speak with a nurse.   For questions related to your [facility] health plan, please call: [phone] or go here:[email or web address]  If you would like to schedule transportation through your [facility] plan, please call the following number at least 2 days in advance of your appointment: [phone].  You can also use the MTM portal or MTM mobile app to manage your rides. For the portal, please go to [email or web address].  Call the [place] at [phone], at any time, 24 hours a day, 7 days a week. If you are in danger or need immediate medical attention call 911.  If you would like help to quit smoking, call 1-800-QUIT-NOW 402-013-7062) OR Espaol: 1-855-Djelo-Ya QO:409462) o para ms informacin haga clic aqu or Text READY to 200-400 to register via text  Stephanie [name],   Patient verbalizes understanding of instructions and care plan provided today and agrees to view in [place]. Active MyChart status and patient understanding of how to access instructions and care plan via MyChart confirmed with patient.     Telephone follow up appointment with Managed Medicaid care management team member scheduled for:01/13/23 '@10'$ :30am  Lurena Joiner RN, BSN [place] RN Care Coordinator   Following is a copy of your plan of care:  Care Plan : RN Care Manager Plan of Care  Updates made by Melissa Montane, RN since 11/12/2022 12:00 AM      Problem: Development of Plan of Care to address Health Management needs related to Stroke history      Long-Range Goal: Development of Plan of Care to address Health Management needs related to Stroke history   Start Date: 05/08/2022  Expected End Date: 01/13/2023  Priority: High  Note:   Current Barriers:  Chronic Disease Management support and education needs related to Improving Health with history of Stroke Patient seen at [facility] on 10/29/22 and will see Dr. Manuella Ghazi on 11/13/22 for plan of care to treat recurrent headaches. She recently lost her dog of 18 yrs, she has therapy scheduled on 12/11/22 @ 9am.  RNCM Clinical Goal(s):  Patient will verbalize understanding of plan for management of Improving Health with history of stroke as evidenced by patient reports attend all scheduled medical appointments: 10/09/22 with PCP and Neurology, 10/23/22 with Pulmonology and CT for lung cancer screening as evidenced by provider documentation in EMR        continue to work with RN Care Manager and/or Social Worker to address care management and care coordination needs related to improving health with history of stroke as evidenced by adherence to CM Team Scheduled appointments     through collaboration with Consulting civil engineer, provider, and care team.   Interventions: Inter-disciplinary care team collaboration (see longitudinal plan of care) Evaluation of current treatment plan related to  self management and patient's adherence to plan as established by provider Reviewed upcoming appointments including: PCP on 10/09/22 and Neurology on 10/09/22, 10/23/22 with Pulmonology and Lung CT   Diabetes Interventions:  (Status:  New goal.)  Long Term Goal Assessed patient's understanding of A1c goal: <7% Reviewed medications with patient and discussed importance of medication adherence Counseled on importance of regular laboratory monitoring as prescribed Discussed plans with patient for ongoing care  management follow up and provided patient with direct contact information for care management team Reviewed scheduled/upcoming provider appointments including: 10/09/22 with PCP and Neurology and 10/23/22 with [facility] for Lung Cancer Screening Assessed social determinant of health barriers Provided encouragement as patient works to lower her A1C Advised patient continue to work on increasing exercise and improving diet  Lab Results  Component Value Date   HGBA1C 7.2 (A) 09/10/2022   Stroke:  (Status:Goal on track:  Yes.) Long Term Goal Reviewed Importance of taking all medications as prescribed Reviewed Importance of attending all scheduled provider appointments Advised to report any changes in symptoms or exercise tolerance Assessed social determinant of health barriers Reviewed the importance of exercise Reviewed recent Neurology note Encouraged patient to continue to exercise at home(stationary bike) Advised patient to attend Neurology appointment on 11/13/22 for plan of care-schedule Vyepti infusions Advised patient to make sure she is drinking 6-8 glasses of water a day to improve hydration  Patient Goals/Self-Care Activities: Take medications as prescribed   Attend all scheduled provider appointments Call provider office for new concerns or questions

## 2022-11-13 DIAGNOSIS — I69322 Dysarthria following cerebral infarction: Secondary | ICD-10-CM | POA: Diagnosis not present

## 2022-11-13 DIAGNOSIS — Z7984 Long term (current) use of oral hypoglycemic drugs: Secondary | ICD-10-CM | POA: Diagnosis not present

## 2022-11-13 DIAGNOSIS — R000 Tachycardia, unspecified: Secondary | ICD-10-CM | POA: Diagnosis not present

## 2022-11-13 DIAGNOSIS — M6281 Muscle weakness (generalized): Secondary | ICD-10-CM | POA: Diagnosis not present

## 2022-11-13 DIAGNOSIS — E119 Type 2 diabetes mellitus without complications: Secondary | ICD-10-CM | POA: Diagnosis not present

## 2022-11-13 DIAGNOSIS — J309 Allergic rhinitis, unspecified: Secondary | ICD-10-CM | POA: Diagnosis not present

## 2022-11-13 DIAGNOSIS — R4781 Slurred speech: Secondary | ICD-10-CM | POA: Diagnosis not present

## 2022-11-13 DIAGNOSIS — G8929 Other chronic pain: Secondary | ICD-10-CM | POA: Diagnosis not present

## 2022-11-13 DIAGNOSIS — Z886 Allergy status to analgesic agent status: Secondary | ICD-10-CM | POA: Diagnosis not present

## 2022-11-13 DIAGNOSIS — M5481 Occipital neuralgia: Secondary | ICD-10-CM | POA: Diagnosis not present

## 2022-11-13 DIAGNOSIS — Z7902 Long term (current) use of antithrombotics/antiplatelets: Secondary | ICD-10-CM | POA: Diagnosis not present

## 2022-11-13 DIAGNOSIS — F4310 Post-traumatic stress disorder, unspecified: Secondary | ICD-10-CM | POA: Diagnosis not present

## 2022-11-13 DIAGNOSIS — G5600 Carpal tunnel syndrome, unspecified upper limb: Secondary | ICD-10-CM | POA: Diagnosis not present

## 2022-11-13 DIAGNOSIS — R519 Headache, unspecified: Secondary | ICD-10-CM | POA: Diagnosis not present

## 2022-11-13 DIAGNOSIS — N3000 Acute cystitis without hematuria: Secondary | ICD-10-CM | POA: Diagnosis not present

## 2022-11-13 DIAGNOSIS — I10 Essential (primary) hypertension: Secondary | ICD-10-CM | POA: Diagnosis not present

## 2022-11-13 DIAGNOSIS — M47812 Spondylosis without myelopathy or radiculopathy, cervical region: Secondary | ICD-10-CM | POA: Diagnosis not present

## 2022-11-13 DIAGNOSIS — R2990 Unspecified symptoms and signs involving the nervous system: Secondary | ICD-10-CM | POA: Diagnosis not present

## 2022-11-13 DIAGNOSIS — I69354 Hemiplegia and hemiparesis following cerebral infarction affecting left non-dominant side: Secondary | ICD-10-CM | POA: Diagnosis not present

## 2022-11-13 DIAGNOSIS — F419 Anxiety disorder, unspecified: Secondary | ICD-10-CM | POA: Diagnosis not present

## 2022-11-13 DIAGNOSIS — I6523 Occlusion and stenosis of bilateral carotid arteries: Secondary | ICD-10-CM | POA: Diagnosis not present

## 2022-11-13 DIAGNOSIS — R29818 Other symptoms and signs involving the nervous system: Secondary | ICD-10-CM | POA: Diagnosis not present

## 2022-11-13 NOTE — Telephone Encounter (Signed)
Called patient to attempt to locate imaging report, left voicemail and awaiting response.

## 2022-11-14 DIAGNOSIS — R519 Headache, unspecified: Secondary | ICD-10-CM | POA: Diagnosis not present

## 2022-11-15 ENCOUNTER — Telehealth: Payer: Self-pay

## 2022-11-17 ENCOUNTER — Emergency Department (HOSPITAL_COMMUNITY): Payer: Medicaid Other

## 2022-11-17 ENCOUNTER — Emergency Department (HOSPITAL_COMMUNITY)
Admission: EM | Admit: 2022-11-17 | Discharge: 2022-11-17 | Disposition: A | Discharge status: Home / Self Care | Payer: Medicaid Other | Attending: Medical | Admitting: Medical

## 2022-11-17 ENCOUNTER — Other Ambulatory Visit: Payer: Self-pay

## 2022-11-17 DIAGNOSIS — Z79899 Other long term (current) drug therapy: Secondary | ICD-10-CM | POA: Diagnosis not present

## 2022-11-17 DIAGNOSIS — R109 Unspecified abdominal pain: Secondary | ICD-10-CM | POA: Diagnosis not present

## 2022-11-17 DIAGNOSIS — N12 Tubulo-interstitial nephritis, not specified as acute or chronic: Secondary | ICD-10-CM | POA: Diagnosis not present

## 2022-11-17 DIAGNOSIS — N281 Cyst of kidney, acquired: Secondary | ICD-10-CM | POA: Diagnosis not present

## 2022-11-17 DIAGNOSIS — D72829 Elevated white blood cell count, unspecified: Secondary | ICD-10-CM | POA: Diagnosis not present

## 2022-11-17 DIAGNOSIS — N10 Acute tubulo-interstitial nephritis: Secondary | ICD-10-CM

## 2022-11-17 DIAGNOSIS — Z7984 Long term (current) use of oral hypoglycemic drugs: Secondary | ICD-10-CM | POA: Insufficient documentation

## 2022-11-17 DIAGNOSIS — I10 Essential (primary) hypertension: Secondary | ICD-10-CM | POA: Insufficient documentation

## 2022-11-17 DIAGNOSIS — E876 Hypokalemia: Secondary | ICD-10-CM | POA: Insufficient documentation

## 2022-11-17 DIAGNOSIS — E1165 Type 2 diabetes mellitus with hyperglycemia: Secondary | ICD-10-CM | POA: Diagnosis not present

## 2022-11-17 DIAGNOSIS — K760 Fatty (change of) liver, not elsewhere classified: Secondary | ICD-10-CM | POA: Diagnosis not present

## 2022-11-17 DIAGNOSIS — R7401 Elevation of levels of liver transaminase levels: Secondary | ICD-10-CM | POA: Insufficient documentation

## 2022-11-17 DIAGNOSIS — R079 Chest pain, unspecified: Secondary | ICD-10-CM | POA: Diagnosis not present

## 2022-11-17 DIAGNOSIS — G43909 Migraine, unspecified, not intractable, without status migrainosus: Secondary | ICD-10-CM | POA: Insufficient documentation

## 2022-11-17 DIAGNOSIS — R519 Headache, unspecified: Secondary | ICD-10-CM | POA: Diagnosis present

## 2022-11-17 DIAGNOSIS — D696 Thrombocytopenia, unspecified: Secondary | ICD-10-CM | POA: Insufficient documentation

## 2022-11-17 DIAGNOSIS — Z7902 Long term (current) use of antithrombotics/antiplatelets: Secondary | ICD-10-CM | POA: Diagnosis not present

## 2022-11-17 LAB — CBC WITH DIFFERENTIAL/PLATELET
Abs Immature Granulocytes: 0.02 10*3/uL (ref 0.00–0.07)
Basophils Absolute: 0 10*3/uL (ref 0.0–0.1)
Basophils Relative: 0 %
Eosinophils Absolute: 0.1 10*3/uL (ref 0.0–0.5)
Eosinophils Relative: 1 %
HCT: 39.2 % (ref 36.0–46.0)
Hemoglobin: 12.4 g/dL (ref 12.0–15.0)
Immature Granulocytes: 0 %
Lymphocytes Relative: 29 %
Lymphs Abs: 1.6 10*3/uL (ref 0.7–4.0)
MCH: 31.1 pg (ref 26.0–34.0)
MCHC: 31.6 g/dL (ref 30.0–36.0)
MCV: 98.2 fL (ref 80.0–100.0)
Monocytes Absolute: 0.5 10*3/uL (ref 0.1–1.0)
Monocytes Relative: 9 %
Neutro Abs: 3.3 10*3/uL (ref 1.7–7.7)
Neutrophils Relative %: 61 %
Platelets: 147 10*3/uL — ABNORMAL LOW (ref 150–400)
RBC: 3.99 MIL/uL (ref 3.87–5.11)
RDW: 14.3 % (ref 11.5–15.5)
WBC: 5.5 10*3/uL (ref 4.0–10.5)
nRBC: 0 % (ref 0.0–0.2)

## 2022-11-17 LAB — URINALYSIS, W/ REFLEX TO CULTURE (INFECTION SUSPECTED)
Bilirubin Urine: NEGATIVE
Glucose, UA: NEGATIVE mg/dL
Hgb urine dipstick: NEGATIVE
Ketones, ur: NEGATIVE mg/dL
Nitrite: NEGATIVE
Protein, ur: NEGATIVE mg/dL
Specific Gravity, Urine: 1.013 (ref 1.005–1.030)
WBC, UA: >50 WBC/hpf (ref 0–5)
pH: 5 (ref 5.0–8.0)

## 2022-11-17 LAB — COMPREHENSIVE METABOLIC PANEL
ALT: 19 U/L (ref 0–44)
AST: 24 U/L (ref 15–41)
Albumin: 3.9 g/dL (ref 3.5–5.0)
Alkaline Phosphatase: 110 U/L (ref 38–126)
Anion gap: 8 (ref 5–15)
BUN: 16 mg/dL (ref 6–20)
CO2: 29 mmol/L (ref 22–32)
Calcium: 9.3 mg/dL (ref 8.9–10.3)
Chloride: 98 mmol/L (ref 98–111)
Creatinine, Ser: 0.94 mg/dL (ref 0.44–1.00)
GFR, Estimated: >60 mL/min (ref >60)
Glucose, Bld: 187 mg/dL — ABNORMAL HIGH (ref 70–99)
Potassium: 3.4 mmol/L — ABNORMAL LOW (ref 3.5–5.1)
Sodium: 135 mmol/L (ref 135–145)
Total Bilirubin: 0.7 mg/dL (ref 0.3–1.2)
Total Protein: 7.1 g/dL (ref 6.5–8.1)

## 2022-11-17 LAB — I-STAT BETA HCG BLOOD, ED (MC, WL, AP ONLY): I-stat hCG, quantitative: 10.1 m[IU]/mL — ABNORMAL HIGH (ref <5)

## 2022-11-17 LAB — LACTIC ACID, PLASMA
Lactic Acid, Venous: 2.4 mmol/L (ref 0.5–1.9)
Lactic Acid, Venous: 1.3 mmol/L (ref 0.5–1.9)

## 2022-11-17 LAB — CBG MONITORING, ED: Glucose-Capillary: 166 mg/dL — ABNORMAL HIGH (ref 70–99)

## 2022-11-17 MED ORDER — CEPHALEXIN 500 MG PO CAPS: 500.0000 mg | ORAL_CAPSULE | Freq: Four times a day (QID) | ORAL | 0 refills | Status: AC

## 2022-11-17 MED ORDER — MORPHINE SULFATE (PF) 4 MG/ML IV SOLN
4.0000 mg | Freq: Once | INTRAVENOUS | Status: AC
  Administered 2022-11-17: 4 mg via INTRAVENOUS
  Filled 2022-11-17: qty 1

## 2022-11-17 MED ORDER — SODIUM CHLORIDE 0.9 % IV SOLN
1.0000 g | Freq: Once | INTRAVENOUS | Status: AC
  Administered 2022-11-17: 1 g via INTRAVENOUS
  Filled 2022-11-17: qty 10

## 2022-11-17 MED ORDER — FENTANYL CITRATE PF 50 MCG/ML IJ SOSY
50.0000 ug | PREFILLED_SYRINGE | Freq: Once | INTRAMUSCULAR | Status: AC
  Administered 2022-11-17: 50 ug via INTRAVENOUS
  Filled 2022-11-17: qty 1

## 2022-11-17 MED ORDER — SODIUM CHLORIDE 0.9 % IV BOLUS
1000.0000 mL | Freq: Once | INTRAVENOUS | Status: AC
  Administered 2022-11-17: 1000 mL via INTRAVENOUS

## 2022-11-17 MED ORDER — CEPHALEXIN 500 MG PO CAPS
500.0000 mg | ORAL_CAPSULE | Freq: Once | ORAL | Status: AC
  Administered 2022-11-17: 500 mg via ORAL
  Filled 2022-11-17: qty 1

## 2022-11-17 NOTE — ED Provider Notes (Signed)
[facility] Provider Note   CSN: [id number] Arrival date & time: 11/17/22  0901     History  Chief Complaint  Patient presents with   Flank Pain   Headache    Stephanie [name] is a 57 y.o. female history includes ICH, hypertension, hyperlipidemia, type 2 diabetes, spondylosis, degenerative disc disease, hepatitis C, migraines, CVA with residual left-sided weakness.  Patient presents to the ER today for evaluation of left flank pain that has been ongoing for the past 3-4 days, she describes pain as sharp intermittent and moderate-severe, no clear aggravating or alleviating factors.  She reports associated with some hematuria that she has noticed over the past 2-3 days as well.  She does report being in the emergency permit at [facility] 4 days ago, I reviewed those notes it appears she was diagnosed with a UTI and prescribed Keflex at that time.  Patient reports she is not aware that she had a UTI and has not started any antibiotics.  She does report a history of kidney stone disease and feels that her symptoms today are similar.  Additionally patient reports that she has been experiencing a migraine for the past 2 days as well she describes as typical for her pain is mostly on the front and right side of her head it is aching constant she reports it came on gradually yesterday and has gotten worse throughout the day, she reports it is associated with some blurry vision in her right eye but reports this is normal for her whenever she has a migraine.  She denies any abnormal features.  Denies fever, chills, trauma, nausea, vomiting, sudden onset, neck pain or any additional concerns.  HPI     Home Medications Prior to Admission medications   Medication Sig Start Date End Date Taking? Authorizing Provider  cephALEXin (KEFLEX) 500 MG capsule Take 1 capsule (500 mg total) by mouth 4 (four) times daily for 10 days. 11/17/22 11/27/22 Yes Nuala Alpha A,  PA-C  albuterol (VENTOLIN HFA) 108 (90 Base) MCG/ACT inhaler Inhale 2 puffs into the lungs every 6 (six) hours as needed for wheezing or shortness of breath. 02/13/22   Myles Gip, DO  ARIPiprazole (ABILIFY) 15 MG tablet Take 15 mg by mouth every morning. 06/10/21   [provider]  blood glucose meter kit and supplies Dispense based on patient and insurance preference. Use up to four times daily as directed. (FOR ICD-10 E10.9, E11.9). 10/22/22   Teodora Medici, DO  CAPLYTA 42 MG capsule Take 42 mg by mouth every evening. 06/08/21   [provider]  clopidogrel (PLAVIX) 75 MG tablet Take 75 mg by mouth daily. 02/25/22   [provider]  Continuous Blood Gluc Receiver (FREESTYLE LIBRE 2 READER) DEVI USE TO CHECK SUGARS DAILY 10/28/22   Evern Bio, NP  Continuous Blood Gluc Sensor (FREESTYLE LIBRE 2 SENSOR) MISC 1 each by Does not apply route every 14 (fourteen) days. 10/22/22   Teodora Medici, DO  diazepam (VALIUM) 10 MG tablet Take 10 mg by mouth 3 (three) times daily. 03/27/22   [provider]  fluticasone-salmeterol (ADVAIR) 100-50 MCG/ACT AEPB Inhale 1 puff into the lungs 2 (two) times daily. 08/05/22   Teodora Medici, DO  furosemide (LASIX) 40 MG tablet Take 1 tablet (40 mg total) by mouth 2 (two) times daily as needed for edema (take 40 mg in the morning, can take additional 40 mg in afternoon if needed for edema). 10/04/22   Rosana Berger,  Grayland Ormond, DO  lamoTRIgine (LAMICTAL) 100 MG tablet Take 100 mg by mouth 2 (two) times daily.    [provider]  metFORMIN (GLUCOPHAGE-XR) 500 MG 24 hr tablet Take 1 tablet (500 mg total) by mouth 2 (two) times daily with a meal. 06/12/22   Teodora Medici, DO  omeprazole (PRILOSEC) 40 MG capsule Take 1 capsule (40 mg total) by mouth daily. 10/22/22   Teodora Medici, DO  oxyCODONE-acetaminophen (PERCOCET) 5-325 MG tablet Take 2 tablets by mouth every 4 (four) hours as needed. Patient not taking:  Reported on 11/12/2022 10/10/22   Milton Ferguson, MD  pioglitazone (ACTOS) 15 MG tablet Take 1 tablet by mouth once daily 09/30/22   Teodora Medici, DO  potassium chloride SA (KLOR-CON M20) 20 MEQ tablet TAKE 1 TABLET BY MOUTH TWICE DAILY FOR THREE DAYS. THEN TAKE ONE TABLET DAILY WITH LASIX 09/23/22   Teodora Medici, DO  prazosin (MINIPRESS) 2 MG capsule Take 4 mg by mouth every evening. 02/15/21   [provider]  traZODone (DESYREL) 150 MG tablet Take by mouth at bedtime.    [provider]  Vilazodone HCl (VIIBRYD) 40 MG TABS Take 40 mg by mouth daily.    [provider]      Allergies    Gabapentin, Ibuprofen, Sulfa antibiotics, Tylenol [acetaminophen], Aspirin, Buprenorphine hcl, Carbamazepine, Codeine, Compazine, Elemental sulfur, Ioxaglate, Ivp dye [iodinated contrast media], Metrizamide, Naproxen, Norco [hydrocodone-acetaminophen], Other, Penicillin g, Prochlorperazine maleate, Reglan [metoclopramide], Toradol [ketorolac tromethamine], Tramadol, and Zofran [ondansetron hcl]    Review of Systems   Review of Systems Ten systems are reviewed and are negative for acute change except as noted in the HPI  Physical Exam Updated Vital Signs BP 126/88   Pulse 60   Temp 97.8 F (36.6 C) (Oral)   Resp 18   Ht '5\' 2"'$  (1.575 m)   Wt 90 kg   SpO2 95%   BMI 36.29 kg/m  Physical Exam Constitutional:      General: She is not in acute distress.    Appearance: Normal appearance. She is well-developed. She is not ill-appearing or diaphoretic.  HENT:     Head: Normocephalic and atraumatic.  Eyes:     General: Vision grossly intact. Gaze aligned appropriately.     Pupils: Pupils are equal, round, and reactive to light.  Neck:     Trachea: Trachea and phonation normal.  Pulmonary:     Effort: Pulmonary effort is normal. No respiratory distress.  Abdominal:     General: There is no distension.     Palpations: Abdomen is soft.     Tenderness: There is no abdominal  tenderness. There is left CVA tenderness. There is no right CVA tenderness, guarding or rebound.  Musculoskeletal:        General: Normal range of motion.     Cervical back: Normal range of motion.  Skin:    General: Skin is warm and dry.  Neurological:     Mental Status: She is alert.     GCS: GCS eye subscore is 4. GCS verbal subscore is 5. GCS motor subscore is 6.     Comments:  Mild left-sided facial droop patient reports his baseline since CVA in 2017. Minimal slurred speech patient reports that his baseline since CVA in 2017. Movements bilaterally grossly intact without ataxia.  Equal appropriate grip, push/pull and dorsi/plantarflexion.  Equal hip flexion bilaterally.  Psychiatric:        Behavior: Behavior normal.    ED Results / Procedures / Treatments  Labs (all labs ordered are listed, but only abnormal results are displayed) Labs Reviewed  LACTIC ACID, PLASMA - Abnormal; Notable for the following components:      Result Value   Lactic Acid, Venous 2.4 (*)    All other components within normal limits  COMPREHENSIVE METABOLIC PANEL - Abnormal; Notable for the following components:   Potassium 3.4 (*)    Glucose, Bld 187 (*)    All other components within normal limits  CBC WITH DIFFERENTIAL/PLATELET - Abnormal; Notable for the following components:   Platelets 147 (*)    All other components within normal limits  URINALYSIS, W/ REFLEX TO CULTURE (INFECTION SUSPECTED) - Abnormal; Notable for the following components:   APPearance HAZY (*)    Leukocytes,Ua LARGE (*)    Bacteria, UA RARE (*)    All other components within normal limits  I-STAT BETA HCG BLOOD, ED ([facility], [facility], [facility] ONLY) - Abnormal; Notable for the following components:   I-stat hCG, quantitative 10.1 (*)    All other components within normal limits  CBG MONITORING, ED - Abnormal; Notable for the following components:   Glucose-Capillary 166 (*)    All other components within normal limits  CULTURE, BLOOD  (ROUTINE X 2)  CULTURE, BLOOD (ROUTINE X 2)  URINE CULTURE  LACTIC ACID, PLASMA  PROTIME-INR  APTT    EKG EKG Interpretation  Date/Time:  Sunday November 17 2022 09:47:09 EDT Ventricular Rate:  86 PR Interval:  195 QRS Duration: 94 QT Interval:  354 QTC Calculation: 424 R Axis:   -20 Text Interpretation: Sinus rhythm Borderline left axis deviation Confirmed by Cindee Lame [id number]) on 11/17/2022 12:11:47 PM  Radiology CT Renal Stone Study  Result Date: 11/17/2022 CLINICAL DATA:  Abdominal pain and flank pain. Kidney stones suspected. EXAM: CT ABDOMEN AND PELVIS WITHOUT CONTRAST TECHNIQUE: Multidetector CT imaging of the abdomen and pelvis was performed following the standard protocol without IV contrast. RADIATION DOSE REDUCTION: This exam was performed according to the departmental dose-optimization program which includes automated exposure control, adjustment of the mA and/or kV according to patient size and/or use of iterative reconstruction technique. COMPARISON:  04/16/2022 FINDINGS: Lower chest: Dependent atelectasis noted in the lung bases. Hepatobiliary: The liver shows diffusely decreased attenuation suggesting fat deposition. There is no evidence for gallstones, gallbladder wall thickening, or pericholecystic fluid. No intrahepatic or extrahepatic biliary dilation. Pancreas: No focal mass lesion. No dilatation of the main duct. No intraparenchymal cyst. No peripancreatic edema. Spleen: No splenomegaly. No focal mass lesion. Adrenals/Urinary Tract: No adrenal nodule or mass. Multiple stones are seen in the right kidney, similar to prior and ranging in size from 2 mm up to about 5 mm. No right hydronephrosis. Stable small cyst lower pole right kidney. No followup imaging is recommended. Single 5 mm stone noted upper pole left kidney without hydronephrosis. No ureteral or bladder stones. Stomach/Bowel: Stomach is unremarkable. No gastric wall thickening. No evidence of outlet obstruction.  Duodenum is normally positioned as is the ligament of Treitz. No small bowel wall thickening. No small bowel dilatation. The terminal ileum is normal. The appendix is normal. No gross colonic mass. No colonic wall thickening. Diverticular changes are noted in the left colon without evidence of diverticulitis. Vascular/Lymphatic: There is mild atherosclerotic calcification of the abdominal aorta without aneurysm. There is no gastrohepatic or hepatoduodenal ligament lymphadenopathy. No retroperitoneal or mesenteric lymphadenopathy. No pelvic sidewall lymphadenopathy. Reproductive: Hysterectomy.  There is no adnexal mass. Other: No intraperitoneal free fluid. Musculoskeletal: No worrisome lytic or sclerotic  osseous abnormality. IMPRESSION: 1. No acute findings in the abdomen or pelvis. Specifically, no findings to explain the patient's history of abdominal pain. 2. Bilateral nonobstructing renal stones. 3. Hepatic steatosis. 4. Left colonic diverticulosis without diverticulitis. 5.  Aortic Atherosclerosis (ICD10-I70.0). Electronically Signed   By: Misty Stanley M.D.   On: 11/17/2022 11:05   DG Chest Port 1 View  Result Date: 11/17/2022 CLINICAL DATA:  Questionable sepsis.  Left flank pain and dysuria. EXAM: PORTABLE CHEST 1 VIEW COMPARISON:  10/10/2022. FINDINGS: Clear lungs. Normal heart size and mediastinal contours. No pleural effusion or pneumothorax. Visualized bones and upper abdomen are unremarkable. IMPRESSION: No evidence of acute cardiopulmonary disease. Electronically Signed   By: Emmit Alexanders M.D.   On: 11/17/2022 09:52    Procedures Procedures    Medications Ordered in ED Medications  sodium chloride 0.9 % bolus 1,000 mL (0 mLs Intravenous Stopped 11/17/22 1352)  fentaNYL (SUBLIMAZE) injection 50 mcg (50 mcg Intravenous Given 11/17/22 1048)  morphine (PF) 4 MG/ML injection 4 mg (4 mg Intravenous Given 11/17/22 1212)  cefTRIAXone (ROCEPHIN) 1 g in sodium chloride 0.9 % 100 mL IVPB (0 g  Intravenous Stopped 11/17/22 1352)  cephALEXin (KEFLEX) capsule 500 mg (500 mg Oral Given 11/17/22 1234)    ED Course/ Medical Decision Making/ A&P Clinical Course as of 11/17/22 1550  Sun Nov 17, 2022  0953 Urinalysis, w/ Reflex to Culture (Infection Suspected) -Urine, Clean Catch(!) Greater than 50 WBCs, large leukocytes, rare bacteria.  Suspect UTI will obtain culture. [BM]  [id number] DG Chest [facility] I personally reviewed and interpreted patient's 1 view chest x-ray.  I do not appreciate any obvious PTX, PNA, effusion or consolidation. [BM]  0957 ED EKG 12-Lead I have personally reviewed and interpreted patient's twelve-lead EKG.  Shows normal sinus rhythm with rate of 86.  I do not appreciate any obvious acute ischemic changes. [BM]  1034 CBC with Differential(!) CBC shows slight thrombocytopenia at 147.  No leukocytosis or anemia. [BM]  1035 I-Stat beta hCG blood, ED(!) Beta-hCG 10.1 suspect as false positive.  Patient has a prior hysterectomy. [BM]  1056 Lactic acid, plasma(!!) Lactic 2.4.  Fluids ordered.  [BM]  1057 Comprehensive metabolic panel(!) CMP shows mild hypokalemia at 3.4.  Hyperglycemia at 187.  No emergent electrolyte derangement, AKI, LFT elevations or gap. [BM]  1058 POC CBG, ED(!) CBG 166 [BM]  1207 Patient reassessed, resting company bed.  Reports continued headache and left flank pain.  Will provide additional pain medication.  Vital signs within normal limits, heart rate 75. [BM]  1229 I personally reviewed and interpreted patient CT renal stone study.  I do not appreciate any obvious urolithiasis or hydronephrosis.  Patient does have several phleboliths in the pelvis.  I have reviewed radiology interpretation which reports no acute findings.  Does note nonobstructing renal stones, hepatic steatosis, diverticulosis and aortic atherosclerosis.  Additional findings noted to be a small cyst in the pole of the right kidney, no follow-up imaging recommended. [BM]  1318  Patient reassessed she is resting comfortably in bed no acute distress.  Vital signs stable.  She is sleeping comfortably and easily arousable to voice.  Patient states understanding of care plan she has no questions at this time.  Headache is improving.  We are awaiting second lactic prior to discharge. [BM]  1447 Lactic acid, plasma Delta lactic has cleared, 1.3. [BM]    Clinical Course User Index [BM] Deliah Boston, PA-C  Medical Decision Making 57 year old female history as above presented for evaluation of left flank pain that has been ongoing for the past few days.  She denies any fall or injury, orts pain is sharp and intermittent and she is concerned for a kidney stone she reports history of similar in the past.  She also reports a headache which is consistent with her typical migraine without abnormal features, she reports that this has been ongoing for the past day and a half it started gradually yesterday and worsened throughout the day.  She reports that she was seen at another emergency department for headache a few days ago, they gave her a headache cocktail she reports it never fully resolved.  She denies any abnormal features to her headaches or weakness or difficulty speaking.  She does report a history of CVA back in 2017 which left her with some facial droop and weakness she denies any change in the symptoms.  Amount and/or Complexity of Data Reviewed External Data Reviewed: notes.    Details:  Reviewed ER visit from 11/13/2022 at [facility].  Reviewed consult note from Dr. Olen Pel.  It appears patient presented to the ER for back pain severe headache.  ED provider noted facial droop slurred speech and called a code stroke.  Neurology noted left hemibody sensory loss, mild slurred speech and chronic left-sided facial droop.  Some right hand weakness was noted as well.  Patient was seen to be a poor historian and was outside of tPA window.  Symptoms were noted  to be actually be chronic, symptoms are in the setting of unilateral headache with photophobia.  They recommended MRI brain and treatment for migraine.  MRI obtained on 11/14/2022 was "no findings to suggest acute infarct, as clinically questioned.)  Patient was cleared for discharge.  Patient was given a course of Keflex for UTI. Labs: ordered. Decision-making details documented in ED Course.    Details:  Beta-hCG 10.1 suspect false positive, patient has a hysterectomy. Urinalysis shows large leukocytes, greater than 50 WBCs and bacteria.  No nitrites, no hemoglobin.  Suspect UTI with likely left pyelonephritis as cause of patient's symptoms. CBC shows no leukocytosis or anemia.  Mild thrombocytopenia 147. CMP shows no emergent electrolyte derangement, AKI, LFT elevations or gap. Initial lactic slightly elevated at 2.4 delta lactic improved at 1.3. CBG 166. Radiology: ordered. Decision-making details documented in ED Course.    Details: I personally reviewed and interpreted patient CT renal stone study.  I do not appreciate any obvious urolithiasis or hydronephrosis.  Patient does have several phleboliths in the pelvis.  I have reviewed radiology interpretation which reports no acute findings.  Does note nonobstructing renal stones, hepatic steatosis, diverticulosis and aortic atherosclerosis.  Additional findings noted to be a small cyst in the pole of the right kidney, no follow-up imaging recommended.   I have personally reviewed and interpreted patient's 1 view chest x-ray.  I do not appreciate any obvious PTX, PNA, effusion or consolidation. ECG/medicine tests: ordered. Decision-making details documented in ED Course.    Details: I have personally reviewed and interpreted patient's twelve-lead EKG.  I do not appreciate any obvious acute ischemic changes.  Normal sinus rhythm rate of 86.  Risk Prescription drug management.  Patient reassessed she is sleeping comfortably bed no acute distress.   She reports her headache has improved.  Suspect this to be her typical migraine, low suspicion for ICH or other emergent causes.  This was discussed with patient she is in agreement, patient courage  to follow-up with her PCP and neurologist regards to her headaches.  As the patient's left flank pain suspect this is due to pyelonephritis, she was diagnosed with UTI at the other emergency department a few days ago she reports she was unaware of this and did not take the Keflex they prescribed her.  Patient was given a dose of IV Rocephin here along with Keflex and provided with a course of Keflex outpatient.  Patient encouraged to follow-up with her PCP this week for recheck and strict ER precautions were discussed.  Urine culture was added after discharge, called lab and they confirmed they still have the sample and will run the culture.  Doubt sepsis, no indication for admission at this time.  Case was discussed with attending physician Dr. Mayra Neer who agrees with antibiotic choice and discharge with outpatient follow-up.  At this time there does not appear to be any evidence of an acute emergency medical condition and the patient appears stable for discharge with appropriate outpatient follow up. Diagnosis was discussed with patient who verbalizes understanding of care plan and is agreeable to discharge. I have discussed return precautions with patient who verbalizes understanding. Patient encouraged to follow-up with their PCP and neurologist. All questions answered.    Note: Portions of this report may have been transcribed using voice recognition software. Every effort was made to ensure accuracy; however, inadvertent computerized transcription errors may still be present.         Final Clinical Impression(s) / ED Diagnoses Final diagnoses:  Left flank pain  Acute pyelonephritis  Migraine without status migrainosus, not intractable, unspecified migraine type    Rx / DC Orders ED Discharge  Orders          Ordered    cephALEXin (KEFLEX) 500 MG capsule  4 times daily        11/17/22 1446              Gari Crown 11/17/22 1550    Audley Hose, MD 11/18/22 1717

## 2022-11-17 NOTE — ED Triage Notes (Signed)
Pt reports migrain since last night Excedrin and ibuprofen did not help. Also reports left flank pain and dysuria. Hx kidney stones

## 2022-11-17 NOTE — Discharge Instructions (Addendum)
At this time there does not appear to be the presence of an emergent medical condition, however there is always the potential for conditions to change. Please read and follow the below instructions.  Please return to the Emergency Department immediately for any new or worsening symptoms or if your symptoms do not improve within 2 days. Please be sure to follow up with your Primary Care Provider within one week regarding your visit today; please call their office to schedule an appointment even if you are feeling better for a follow-up visit. Your CT scan showed kidney stones within your kidney.  Please follow-up with your primary care provider regarding this.  It also showed some fatty liver disease, diverticulosis and plaque buildup within your artery.  Please discuss these findings with your primary care provider.  Additionally the radiologist saw a cyst in the lower pole of your right kidney, please discuss this with your primary care provider as well. Please take your antibiotic Keflex as prescribed until complete to help with your symptoms.  Please drink enough water to avoid dehydration and get plenty of rest. Please also discuss your headache with your primary care provider and your neurologist. Your urine test was sent for culture, if it grows out bacteria that require different antibiotic to treat you will be contacted with a new prescription.  Please read the additional information packets attached to your discharge summary.  Go to the nearest Emergency Department immediately if: You have fever or chills You have very bad back pain. You have very bad pain in your lower belly. You have a fever. You have chills. You feeling like you will vomit or you vomit. You have any new/concerning or worsening of symptoms  Do not take your medicine if  develop an itchy rash, swelling in your mouth or lips, or difficulty breathing; call 911 and seek immediate emergency medical attention if this  occurs.  You may review your lab tests and imaging results in their entirety on your MyChart account.  Please discuss all results of fully with your primary care provider and other specialist at your follow-up visit.  Note: Portions of this text may have been transcribed using voice recognition software. Every effort was made to ensure accuracy; however, inadvertent computerized transcription errors may still be present.

## 2022-11-18 NOTE — Telephone Encounter (Signed)
Copied from [place] [id number]. Topic: General - Other >> Nov 18, 2022 11:01 AM [name] wrote: Reason for CRM: Pt is calling back requesting to speak with [name]. Pt says she spoke with [name] last week and was told to call her back.

## 2022-11-19 LAB — URINE CULTURE: Culture: 80000 — AB

## 2022-11-20 ENCOUNTER — Telehealth (HOSPITAL_BASED_OUTPATIENT_CLINIC_OR_DEPARTMENT_OTHER): Payer: Self-pay | Admitting: Emergency Medicine

## 2022-11-20 ENCOUNTER — Emergency Department
Admission: EM | Admit: 2022-11-20 | Discharge: 2022-11-20 | Disposition: A | Discharge status: Home / Self Care | Payer: Medicaid Other | Attending: Emergency Medicine | Admitting: Emergency Medicine

## 2022-11-20 DIAGNOSIS — R29810 Facial weakness: Secondary | ICD-10-CM | POA: Diagnosis not present

## 2022-11-20 DIAGNOSIS — G43909 Migraine, unspecified, not intractable, without status migrainosus: Secondary | ICD-10-CM | POA: Diagnosis not present

## 2022-11-20 DIAGNOSIS — G4489 Other headache syndrome: Secondary | ICD-10-CM | POA: Diagnosis not present

## 2022-11-20 DIAGNOSIS — R519 Headache, unspecified: Secondary | ICD-10-CM | POA: Diagnosis present

## 2022-11-20 DIAGNOSIS — G43009 Migraine without aura, not intractable, without status migrainosus: Secondary | ICD-10-CM | POA: Diagnosis not present

## 2022-11-20 DIAGNOSIS — Z743 Need for continuous supervision: Secondary | ICD-10-CM | POA: Diagnosis not present

## 2022-11-20 DIAGNOSIS — G43001 Migraine without aura, not intractable, with status migrainosus: Secondary | ICD-10-CM | POA: Diagnosis not present

## 2022-11-20 MED ORDER — KETOROLAC TROMETHAMINE 30 MG/ML IJ SOLN
15.0000 mg | Freq: Once | INTRAMUSCULAR | Status: AC
  Administered 2022-11-20: 15 mg via INTRAVENOUS
  Filled 2022-11-20: qty 1

## 2022-11-20 MED ORDER — KETOROLAC TROMETHAMINE 10 MG PO TABS
10.0000 mg | ORAL_TABLET | Freq: Once | ORAL | Status: DC
  Filled 2022-11-20: qty 1

## 2022-11-20 MED ORDER — SODIUM CHLORIDE 0.9 % IV BOLUS
1000.0000 mL | Freq: Once | INTRAVENOUS | Status: AC
  Administered 2022-11-20: 1000 mL via INTRAVENOUS

## 2022-11-20 MED ORDER — BUTALBITAL-APAP-CAFFEINE 50-325-40 MG PO TABS: 1.0000 | ORAL_TABLET | Freq: Four times a day (QID) | ORAL | 0 refills | Status: AC | PRN

## 2022-11-20 MED ORDER — PROCHLORPERAZINE EDISYLATE 10 MG/2ML IJ SOLN
5.0000 mg | Freq: Once | INTRAMUSCULAR | Status: AC
  Administered 2022-11-20: 5 mg via INTRAVENOUS
  Filled 2022-11-20: qty 2

## 2022-11-20 MED ORDER — DIPHENHYDRAMINE HCL 50 MG/ML IJ SOLN
25.0000 mg | Freq: Once | INTRAMUSCULAR | Status: AC
  Administered 2022-11-20: 25 mg via INTRAVENOUS
  Filled 2022-11-20: qty 1

## 2022-11-20 MED ORDER — HYDROMORPHONE HCL 1 MG/ML IJ SOLN
0.5000 mg | Freq: Once | INTRAMUSCULAR | Status: AC
  Administered 2022-11-20: 0.5 mg via INTRAVENOUS
  Filled 2022-11-20: qty 0.5

## 2022-11-20 NOTE — Progress Notes (Signed)
ED Antimicrobial Stewardship Positive Culture Follow Up   Stephanie [name] is an 57 y.o. female who presented to [facility] on 11/17/2022 with a chief complaint of  Chief Complaint  Patient presents with   Flank Pain   Headache    Recent Results (from the past 720 hour(s))  Urine Culture (for pregnant, neutropenic or urologic patients or patients with an indwelling urinary catheter)     Status: Abnormal   Collection Time: 11/17/22  9:23 AM   Specimen: Urine, Clean Catch  Result Value Ref Range Status   Specimen Description   Final    URINE, CLEAN CATCH Performed at [facility], [place] [address] [place], [place] [address]    Special Requests   Final    NONE Performed at [facility], [place] [address] [place], [place] [address]    Culture 80,000 COLONIES/mL STAPHYLOCOCCUS EPIDERMIDIS (A)  Final   Report Status 11/19/2022 FINAL  Final   Organism ID, Bacteria STAPHYLOCOCCUS EPIDERMIDIS (A)  Final      Susceptibility   Staphylococcus epidermidis - MIC*    CIPROFLOXACIN <=0.5 SENSITIVE Sensitive     GENTAMICIN <=0.5 SENSITIVE Sensitive     NITROFURANTOIN <=16 SENSITIVE Sensitive     OXACILLIN >=4 RESISTANT Resistant     TETRACYCLINE <=1 SENSITIVE Sensitive     VANCOMYCIN 1 SENSITIVE Sensitive     TRIMETH/SULFA <=10 SENSITIVE Sensitive     CLINDAMYCIN <=0.25 SENSITIVE Sensitive     RIFAMPIN <=0.5 SENSITIVE Sensitive     Inducible Clindamycin NEGATIVE Sensitive     * 80,000 COLONIES/mL STAPHYLOCOCCUS EPIDERMIDIS  Blood Culture (routine x 2)     Status: None (Preliminary result)   Collection Time: 11/17/22 10:10 AM   Specimen: BLOOD  Result Value Ref Range Status   Specimen Description   Final    BLOOD SITE NOT SPECIFIED Performed at [facility] [address], [place], [place] [address]    Special Requests   Final    BOTTLES DRAWN AEROBIC AND ANAEROBIC Blood Culture adequate volume Performed at [place] [facility] [address] [place], [place] [address]    Culture   Final    NO GROWTH 3 DAYS Performed at [facility], [place] [address], [place], [place] [address]    Report Status PENDING  Incomplete    '[x]'$  Treated with Keflex, organism resistant to prescribed antimicrobial  The patient was prescribed Keflex for a prior UA from [facility] on 11/13/22 that was resumed as the patient had not yet started. Our urine sample from this presentation on 3/10 is noted to have squamous 6-10 so this culture likely represents contamination.  Discussed options with the provider on d/cing Keflex vs allowing patient to continue since prescribed before admission. Decision was made to let the patient continue Keflex with no additional antibiotics at this time.   Plan: Continue Keflex as prescribed - no additional treatment warranted.   ED Provider: Octaviano Glow, MD  Thank you for allowing pharmacy to be a part of this patient's care.  [name], PharmD, BCPS Infectious Diseases Clinical Pharmacist 11/20/2022 9:53 AM

## 2022-11-20 NOTE — ED Provider Notes (Signed)
[facility] Provider Note   Event Date/Time   First MD Initiated Contact with Patient 11/20/22 1533     (approximate) History  Headache and Nausea  HPI Stephanie [name] is a 57 y.o. female with a stated past medical history of CVA with chronic deficits as well as chronic migraines who presents complaining of headache as well as right-sided numbness that she states has been present for the past 3 weeks since this migraine began.  Patient has been seen multiple times in multiple emergency departments over the last 3 weeks and states that "the only thing that has worked is Dilaudid and it took my headache away for 1 week but now it is back".  Patient states that she has only been told to take Tylenol for her headaches and states that this Tylenol has not been helping her pain. ROS: Patient currently denies any vision changes, tinnitus, difficulty speaking, facial droop, sore throat, chest pain, shortness of breath, abdominal pain, nausea/vomiting/diarrhea, dysuria   Physical Exam  Triage Vital Signs: ED Triage Vitals  Enc Vitals Group     BP 11/20/22 1539 133/79     Pulse Rate 11/20/22 1539 87     Resp 11/20/22 1539 (!) 22     Temp --      Temp Source 11/20/22 1539 Oral     SpO2 11/20/22 1538 98 %     Weight --      Height --      Head Circumference --      Peak Flow --      Pain Score 11/20/22 1539 10     Pain Loc --      Pain Edu? --      Excl. in [place]? --    Most recent vital signs: Vitals:   11/20/22 1538 11/20/22 1539  BP:  133/79  Pulse:  87  Resp:  (!) 22  SpO2: 98% 100%   General: Awake, oriented x4. CV:  Good peripheral perfusion.  Resp:  Normal effort.  Abd:  No distention.  Other:  Middle-aged obese Caucasian female laying in bed in no acute distress.  Chronic left-sided facial droop.  Subjective right-sided upper extremity numbness ED Results / Procedures / Treatments  PROCEDURES: Critical Care performed: No .1-3 Lead EKG  Interpretation  Performed by: Naaman Plummer, MD Authorized by: Naaman Plummer, MD     Interpretation: normal     ECG rate:  71   ECG rate assessment: normal     Rhythm: sinus rhythm     Ectopy: none     Conduction: normal    MEDICATIONS ORDERED IN ED: Medications  sodium chloride 0.9 % bolus 1,000 mL (1,000 mLs Intravenous New Bag/Given 11/20/22 1559)  prochlorperazine (COMPAZINE) injection 5 mg (5 mg Intravenous Given 11/20/22 1559)  diphenhydrAMINE (BENADRYL) injection 25 mg (25 mg Intravenous Given 11/20/22 1600)  ketorolac (TORADOL) 30 MG/ML injection 15 mg (15 mg Intravenous Given 11/20/22 1559)  HYDROmorphone (DILAUDID) injection 0.5 mg (0.5 mg Intravenous Given 11/20/22 1644)   IMPRESSION / MDM / ASSESSMENT AND PLAN / ED COURSE  I reviewed the triage vital signs and the nursing notes.                             The patient is on the cardiac monitor to evaluate for evidence of arrhythmia and/or significant heart rate changes. Patient's presentation is most consistent with acute presentation with potential threat  to life or bodily function. Presents with Headache.  No focal neurological symptoms. Neuro exam is benign. Pt is nontoxic. VSS.  Based on history and normal neurological exam I have low suspicion for intracranial tumor, intracranial bleed, meningitis, temporal arteritis, glaucoma, CO poisoning.  Most likely patient has benign headache, recommend rest, hydration, and Fioricet as needed.  Patient encouraged to follow-up with her neurologist, Dr. Manuella Ghazi as soon as possible for further management of her chronic migraines.  Disposition: Discharge home with strict return precautions and instructions for prompt primary care follow up.   FINAL CLINICAL IMPRESSION(S) / ED DIAGNOSES   Final diagnoses:  Migraine without aura and with status migrainosus, not intractable   Rx / DC Orders   ED Discharge Orders          Ordered    butalbital-acetaminophen-caffeine (FIORICET)  50-325-40 MG tablet  Every 6 hours PRN        11/20/22 1659           Note:  This document was prepared using Dragon voice recognition software and may include unintentional dictation errors.   Naaman Plummer, MD 11/20/22 [phone]

## 2022-11-20 NOTE — ED Triage Notes (Signed)
Pt to ED from home with a worsening headache with nausea/vomiting. Pt was diagnosed with migraines 39 years ago and sees a neurologist. Pt reports a hx of stroke in 2017 leading to left sided facial droop and weakness of the left arm and leg. Pt reports right sided facial paralysis (partial) and right sided limb weakness for approx. 3-4  months.

## 2022-11-20 NOTE — Telephone Encounter (Signed)
Post ED Visit - Positive Culture Follow-up  Culture report reviewed by antimicrobial stewardship pharmacist: [place] Team '[]'$  [name], Pharm.D. '[]'$  [name], Pharm.D., BCPS AQ-ID '[]'$  [name], Pharm.D., BCPS '[x]'$  [name], Pharm.D., BCPS '[]'$  [place], [place].D., BCPS, AAHIVP '[]'$  Legrand Como, Pharm.D., BCPS, AAHIVP '[]'$  Salome Arnt, PharmD, BCPS '[]'$  [name], PharmD, BCPS '[]'$  [name], PharmD, BCPS '[]'$  [name], PharmD '[]'$  [name], PharmD, BCPS '[]'$  [name], PharmD  [name] '[]'$  [name], PharmD '[]'$  [name], PharmD '[]'$  [name], PharmD '[]'$  [name], Rph '[]'$  [name], PharmD '[]'$  [name], PharmD '[]'$  [name], PharmD '[]'$  [name], PharmD '[]'$  [name], PharmD '[]'$  [name], PharmD '[]'$  [name], PharmD '[]'$  [name], PharmD '[]'$  [name], PharmD   Positive urine culture Treated with cephalexin, organism sensitive to the same and no further patient follow-up is required at this time.  [name] 11/20/2022, 10:59 AM

## 2022-11-22 LAB — CULTURE, BLOOD (ROUTINE X 2)
Culture: NO GROWTH
Special Requests: ADEQUATE

## 2022-11-27 ENCOUNTER — Ambulatory Visit: Payer: Self-pay

## 2022-11-27 NOTE — Telephone Encounter (Signed)
Second attempt to reach pt. Left message to call back about symptoms. 

## 2022-11-27 NOTE — Telephone Encounter (Signed)
Third attempt to contact patient- no answer and call back message left.

## 2022-11-27 NOTE — Telephone Encounter (Signed)
  The patient called in stating both of her feet are very swollen as of yesterday. She states she takes Lasix and they have swelled up before. Please assist patient further      Left message to call back about symptoms.

## 2022-11-29 NOTE — Telephone Encounter (Signed)
  Chief Complaint: Both feet swollen. Request appointment next Wednesday Symptoms: Above Frequency: 1 week Pertinent Negatives: Patient denies chest pain or SOB Disposition: [] ED /[] Urgent Care (no appt availability in office) / [x] Appointment(In office/virtual)/ []  [facility]/ [] Home Care/ [] Refused Recommended Disposition /[] [facility]/ []  Follow-up with PCP Additional Notes: Call back for worsening of symptoms.

## 2022-11-29 NOTE — Telephone Encounter (Signed)
Reason for Disposition . [1] MILD swelling of both ankles (i.e., pedal edema) AND [2] new-onset or worsening  Answer Assessment - Initial Assessment Questions 1. ONSET: "When did the swelling start?" (e.g., minutes, hours, days)     Last week 2. LOCATION: "What part of the leg is swollen?"  "Are both legs swollen or just one leg?"     Both feet 3. SEVERITY: "How bad is the swelling?" (e.g., localized; mild, moderate, severe)   - Localized: Small area of swelling localized to one leg.   - MILD pedal edema: Swelling limited to foot and ankle, pitting edema < 1/4 inch (6 mm) deep, rest and elevation eliminate most or all swelling.   - MODERATE edema: Swelling of lower leg to knee, pitting edema > 1/4 inch (6 mm) deep, rest and elevation only partially reduce swelling.   - SEVERE edema: Swelling extends above knee, facial or hand swelling present.      Mild 4. REDNESS: "Does the swelling look red or infected?"     Yes 5. PAIN: "Is the swelling painful to touch?" If Yes, ask: "How painful is it?"   (Scale 1-10; mild, moderate or severe)     Walking - 7 6. FEVER: "Do you have a fever?" If Yes, ask: "What is it, how was it measured, and when did it start?"      No 7. CAUSE: "What do you think is causing the leg swelling?"     Unsure 8. MEDICAL HISTORY: "Do you have a history of blood clots (e.g., DVT), cancer, heart failure, kidney disease, or liver failure?"     No 9. RECURRENT SYMPTOM: "Have you had leg swelling before?" If Yes, ask: "When was the last time?" "What happened that time?"     Yes 10. OTHER SYMPTOMS: "Do you have any other symptoms?" (e.g., chest pain, difficulty breathing)       No 11. PREGNANCY: "Is there any chance you are pregnant?" "When was your last menstrual period?"       No  Protocols used: Leg Swelling and Edema-A-AH

## 2022-11-30 IMAGING — CT CT HEAD W/O CM
3 series · 15 of 47 positions shown, 18 images · non-contrast
Comparison: CT head 04/20/2020

CLINICAL DATA: Headache

EXAM:
CT HEAD WITHOUT CONTRAST
TECHNIQUE: Contiguous axial images were obtained from the base of the skull
through the vertex without intravenous contrast.

[Series 3: head wo · axial · 0.47mm/px · z∈[-126,-1]mm · 9 of 30 slices shown, 12 images]
[im 3/30  brain]
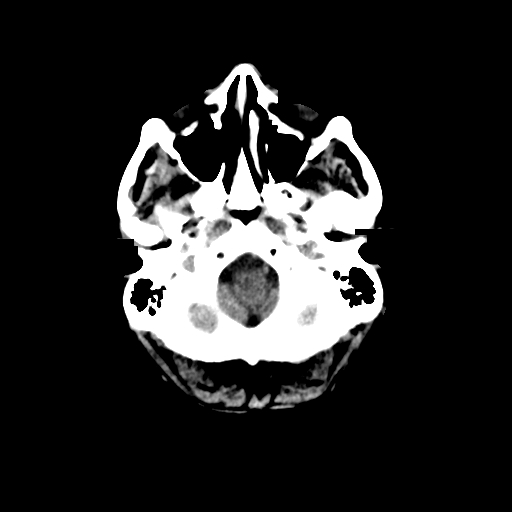
[im 3/30  bone]
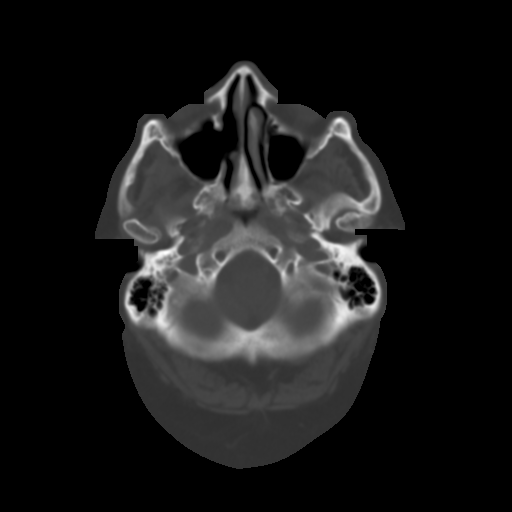
[im 6/30  brain]
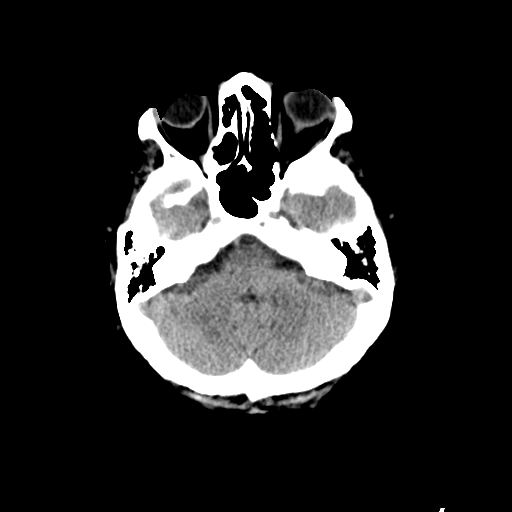
[im 9/30  brain]
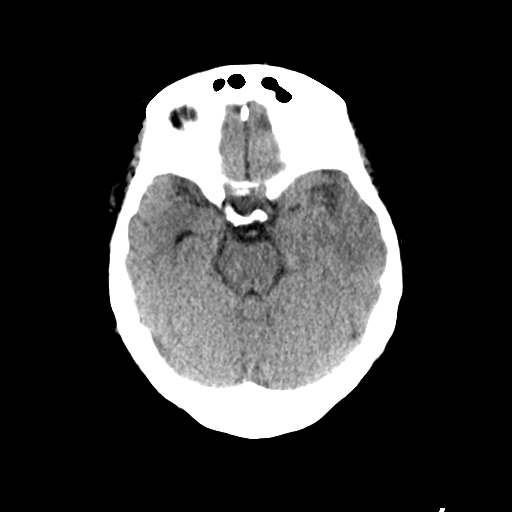
[im 12/30  brain]
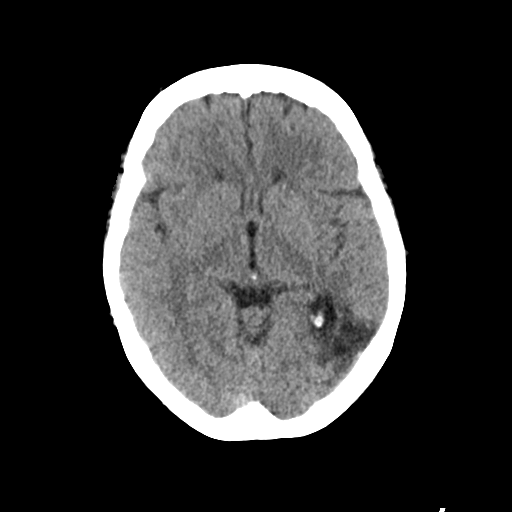
[im 16/30  brain]
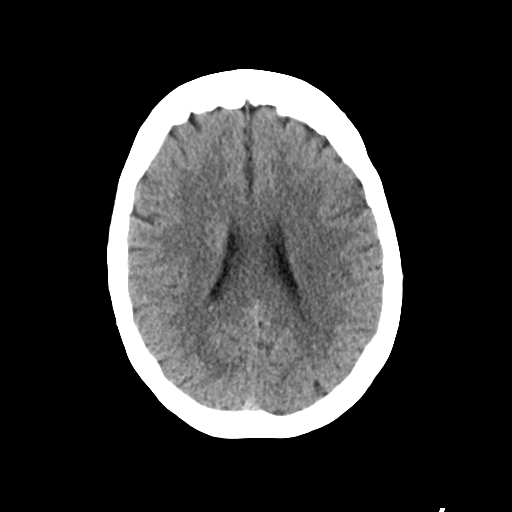
[im 16/30  bone]
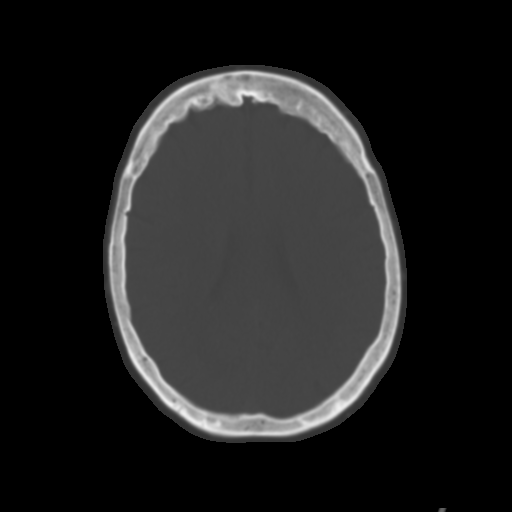
[im 19/30  brain]
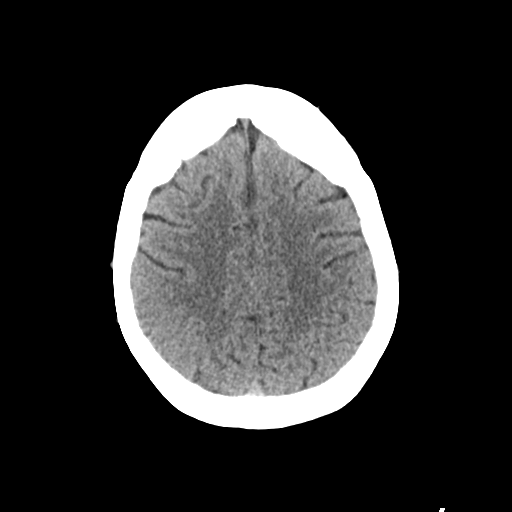
[im 22/30  brain]
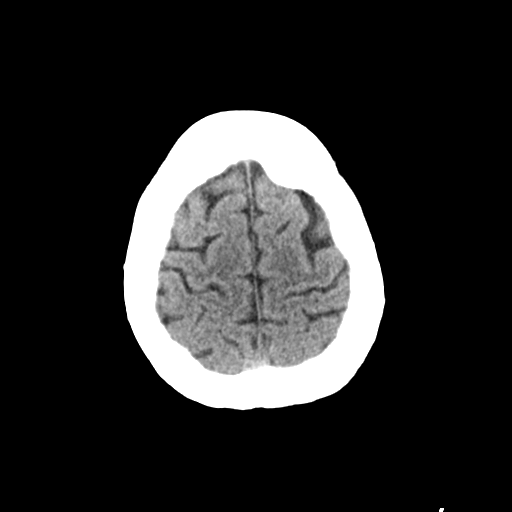
[im 25/30  brain]
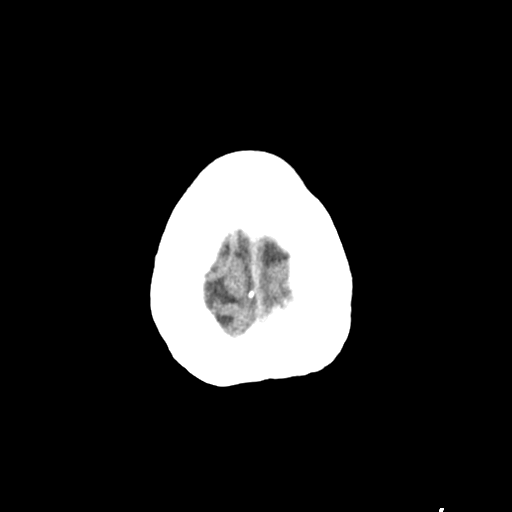
[im 28/30  brain]
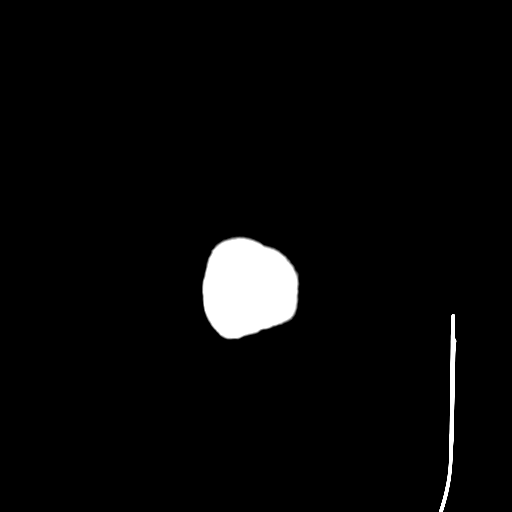
[im 28/30  bone]
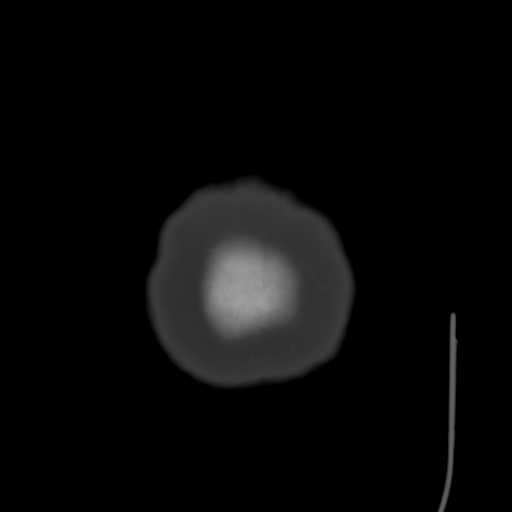

[Series 4: coronal soft tissue · coronal · 0.28mm/px · 3 of 65 slices shown]
[im 22/65  brain]
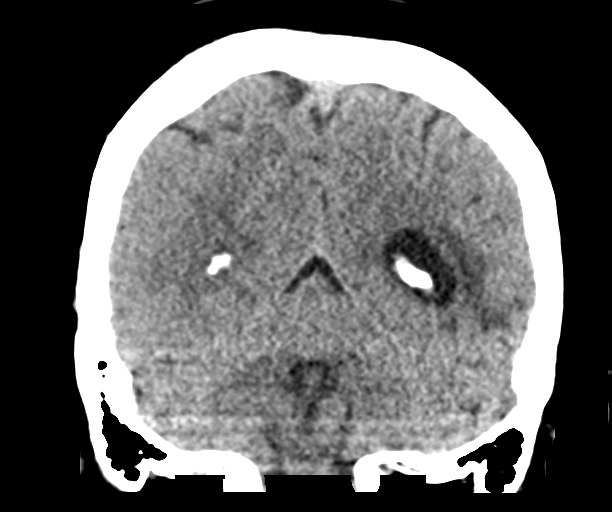
[im 29/65  brain]
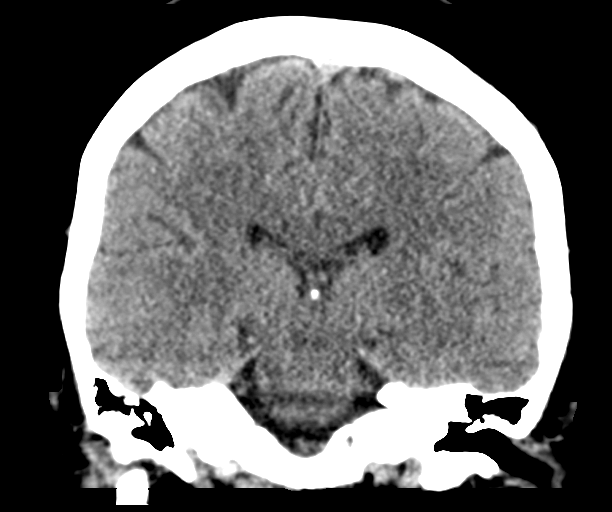
[im 36/65  brain]
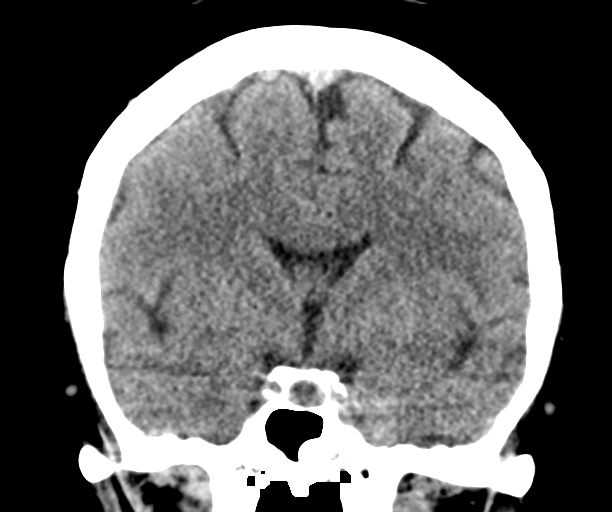

[Series 5: sagittal soft tissue · sagittal · 0.28mm/px · 3 of 53 slices shown]
[im 18/53  brain]
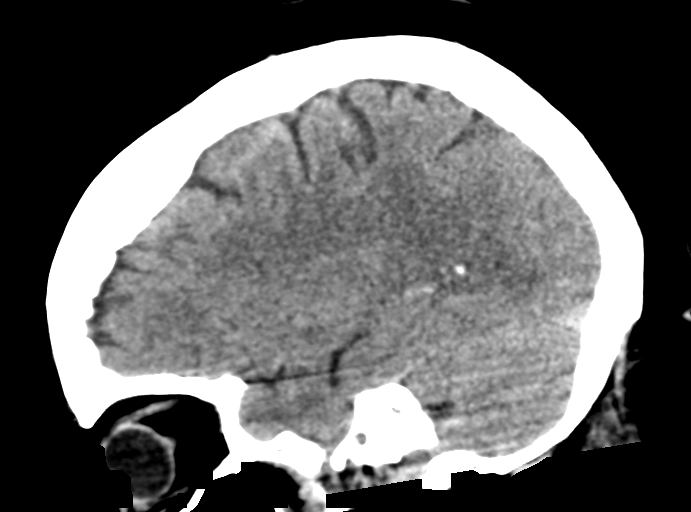
[im 27/53  brain]
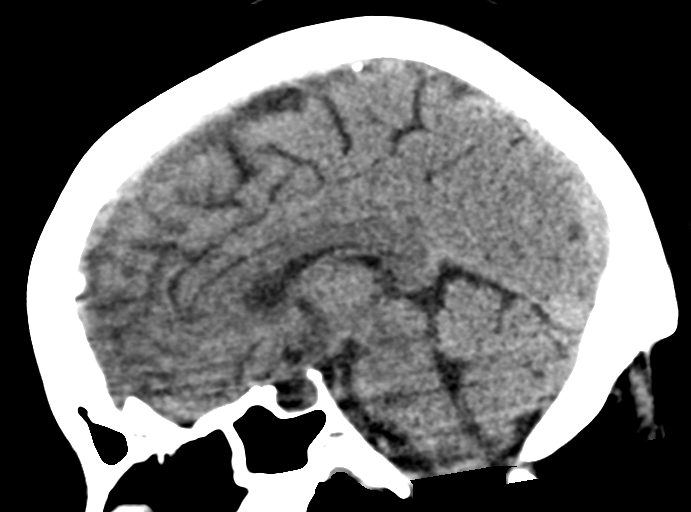
[im 35/53  brain]
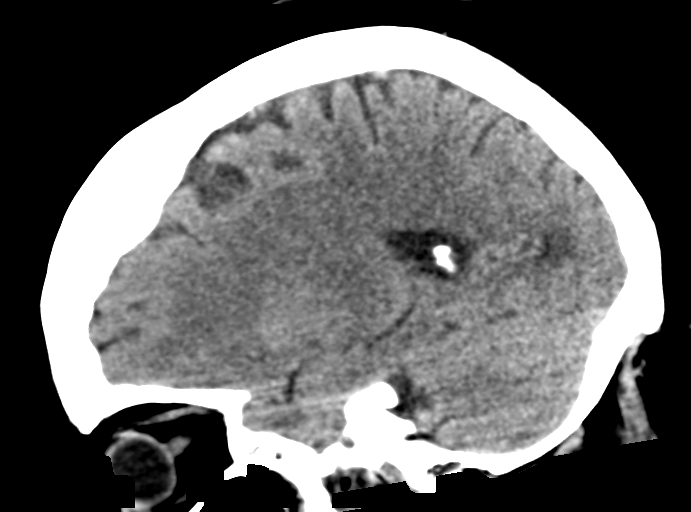

[15 of 47 positions shown; findings below may reference images not displayed]

FINDINGS: Brain: Ventricle size normal. Chronic infarct left posterior
temporal and parietal lobe unchanged.

Negative for acute infarct, hemorrhage, mass.

Vascular: Negative for hyperdense vessel

Skull: Negative

Sinuses/Orbits: Mild mucosal edema paranasal sinuses. Negative orbit

Other: None
IMPRESSION: No acute abnormality

Chronic infarct left temporoparietal lobe unchanged from 4347.

## 2022-12-02 ENCOUNTER — Emergency Department (HOSPITAL_COMMUNITY)
Admission: EM | Admit: 2022-12-02 | Discharge: 2022-12-02 | Disposition: A | Discharge status: Home / Self Care | Payer: Medicaid Other | Attending: Emergency Medicine | Admitting: Emergency Medicine

## 2022-12-02 ENCOUNTER — Encounter (HOSPITAL_COMMUNITY): Payer: Self-pay | Admitting: Emergency Medicine

## 2022-12-02 ENCOUNTER — Emergency Department (HOSPITAL_COMMUNITY): Payer: Medicaid Other

## 2022-12-02 ENCOUNTER — Other Ambulatory Visit: Payer: Self-pay

## 2022-12-02 DIAGNOSIS — Z7984 Long term (current) use of oral hypoglycemic drugs: Secondary | ICD-10-CM | POA: Insufficient documentation

## 2022-12-02 DIAGNOSIS — R519 Headache, unspecified: Secondary | ICD-10-CM | POA: Diagnosis not present

## 2022-12-02 DIAGNOSIS — Z7902 Long term (current) use of antithrombotics/antiplatelets: Secondary | ICD-10-CM | POA: Diagnosis not present

## 2022-12-02 DIAGNOSIS — I10 Essential (primary) hypertension: Secondary | ICD-10-CM | POA: Diagnosis not present

## 2022-12-02 DIAGNOSIS — G43909 Migraine, unspecified, not intractable, without status migrainosus: Secondary | ICD-10-CM | POA: Diagnosis present

## 2022-12-02 DIAGNOSIS — Z79899 Other long term (current) drug therapy: Secondary | ICD-10-CM | POA: Diagnosis not present

## 2022-12-02 DIAGNOSIS — G43809 Other migraine, not intractable, without status migrainosus: Secondary | ICD-10-CM | POA: Diagnosis not present

## 2022-12-02 DIAGNOSIS — E119 Type 2 diabetes mellitus without complications: Secondary | ICD-10-CM | POA: Insufficient documentation

## 2022-12-02 DIAGNOSIS — F17200 Nicotine dependence, unspecified, uncomplicated: Secondary | ICD-10-CM | POA: Diagnosis not present

## 2022-12-02 LAB — CBC
HCT: 41.3 % (ref 36.0–46.0)
Hemoglobin: 13 g/dL (ref 12.0–15.0)
MCH: 31.1 pg (ref 26.0–34.0)
MCHC: 31.5 g/dL (ref 30.0–36.0)
MCV: 98.8 fL (ref 80.0–100.0)
Platelets: 167 10*3/uL (ref 150–400)
RBC: 4.18 MIL/uL (ref 3.87–5.11)
RDW: 13.7 % (ref 11.5–15.5)
WBC: 5.6 10*3/uL (ref 4.0–10.5)
nRBC: 0 % (ref 0.0–0.2)

## 2022-12-02 LAB — CBG MONITORING, ED: Glucose-Capillary: 254 mg/dL — ABNORMAL HIGH (ref 70–99)

## 2022-12-02 LAB — BASIC METABOLIC PANEL
Anion gap: 8 (ref 5–15)
BUN: 18 mg/dL (ref 6–20)
CO2: 28 mmol/L (ref 22–32)
Calcium: 9.4 mg/dL (ref 8.9–10.3)
Chloride: 104 mmol/L (ref 98–111)
Creatinine, Ser: 0.79 mg/dL (ref 0.44–1.00)
GFR, Estimated: >60 mL/min (ref >60)
Glucose, Bld: 208 mg/dL — ABNORMAL HIGH (ref 70–99)
Potassium: 3.7 mmol/L (ref 3.5–5.1)
Sodium: 140 mmol/L (ref 135–145)

## 2022-12-02 MED ORDER — ONDANSETRON HCL 4 MG PO TABS
4.0000 mg | ORAL_TABLET | Freq: Once | ORAL | Status: AC
  Administered 2022-12-02: 4 mg via ORAL
  Filled 2022-12-02: qty 1

## 2022-12-02 MED ORDER — MAGNESIUM SULFATE 2 GM/50ML IV SOLN
2.0000 g | Freq: Once | INTRAVENOUS | Status: AC
  Administered 2022-12-02: 2 g via INTRAVENOUS
  Filled 2022-12-02: qty 50

## 2022-12-02 MED ORDER — KETOROLAC TROMETHAMINE 15 MG/ML IJ SOLN
15.0000 mg | Freq: Once | INTRAMUSCULAR | Status: AC
  Administered 2022-12-02: 15 mg via INTRAVENOUS
  Filled 2022-12-02: qty 1

## 2022-12-02 MED ORDER — SODIUM CHLORIDE 0.9 % IV BOLUS
1000.0000 mL | Freq: Once | INTRAVENOUS | Status: AC
  Administered 2022-12-02: 1000 mL via INTRAVENOUS

## 2022-12-02 MED ORDER — DIPHENHYDRAMINE HCL 25 MG PO CAPS
25.0000 mg | ORAL_CAPSULE | Freq: Once | ORAL | Status: AC
  Administered 2022-12-02: 25 mg via ORAL
  Filled 2022-12-02: qty 1

## 2022-12-02 MED ORDER — HYDROMORPHONE HCL 1 MG/ML IJ SOLN
1.0000 mg | Freq: Once | INTRAMUSCULAR | Status: AC
  Administered 2022-12-02: 1 mg via INTRAVENOUS
  Filled 2022-12-02: qty 1

## 2022-12-02 NOTE — Discharge Instructions (Signed)
Please follow-up with your neurologist if you continue to have ongoing migraine symptoms.  I recommend that you take a stool softener for the next several days continue the narcotic medication that you are seen today.  Please drink plenty of fluids.

## 2022-12-02 NOTE — ED Triage Notes (Signed)
C/o migraine x2 days, playing with dog when it started with sensitivity to light and n/v. Vision changes to right eye. Pt reports right eye changes is normal with her migraines. Tylenol w/o relief.  Pt reports feels like a her normal migraines Hx migraines  A&O x4.

## 2022-12-02 NOTE — ED Provider Triage Note (Signed)
Emergency Medicine Provider Triage Evaluation Note  Stephanie [name] , a 57 y.o. female  was evaluated in triage.  Pt complains of migraine on the right side of her head for the last 2 days.  Patient reports that it started suddenly when she was playing with her dog.  Patient does have a history of chronic migraines.  She reports she was taking Advil, Tylenol with no significant relief.  She reports that she has some loss of vision in the right eye with severe migraines which is not different from baseline.  She denies double vision.  She endorses some photosensitivity and nausea.  Review of Systems  Positive: Headache, vision changes Negative: Fever, chills, numbness  Physical Exam  BP (!) 164/90 (BP Location: Left Arm)   Pulse (!) 109   Temp 98.4 F (36.9 C) (Oral)   Resp 16   SpO2 100%  Gen:   Awake, no distress   Resp:  Normal effort  MSK:   Moves extremities without difficulty  Other:  Moves all 4 limbs spontaneously, CN II through XII grossly intact, can ambulate without difficulty, intact sensation throughout.   Medical Decision Making  Medically screening exam initiated at 3:08 PM.  Appropriate orders placed.  Stephanie [name] was informed that the remainder of the evaluation will be completed by another provider, this initial triage assessment does not replace that evaluation, and the importance of remaining in the ED until their evaluation is complete.  Workup initiated in triage    Stephanie [name], [place] 12/02/22 1510

## 2022-12-02 NOTE — ED Provider Notes (Signed)
[facility] Provider Note   CSN: [id number] Arrival date & time: 12/02/22  1440     History  Chief Complaint  Patient presents with   Migraine    Stephanie [name] is a 57 y.o. female Pt complains of migraine on the right side of her head for the last 2 days.  Patient reports that it started suddenly when she was playing with her dog.  Patient does have a history of chronic migraines.  She reports she was taking Advil, Tylenol with no significant relief.  She reports that she has some loss of vision in the right eye with severe migraines which is not different from baseline.  She denies double vision.  She endorses some photosensitivity and nausea.    Migraine       Home Medications Prior to Admission medications   Medication Sig Start Date End Date Taking? Authorizing Provider  albuterol (VENTOLIN HFA) 108 (90 Base) MCG/ACT inhaler Inhale 2 puffs into the lungs every 6 (six) hours as needed for wheezing or shortness of breath. 02/13/22   Myles Gip, DO  ARIPiprazole (ABILIFY) 15 MG tablet Take 15 mg by mouth every morning. 06/10/21   [provider]  blood glucose meter kit and supplies Dispense based on patient and insurance preference. Use up to four times daily as directed. (FOR ICD-10 E10.9, E11.9). 10/22/22   Teodora Medici, DO  CAPLYTA 42 MG capsule Take 42 mg by mouth every evening. 06/08/21   [provider]  clopidogrel (PLAVIX) 75 MG tablet Take 75 mg by mouth daily. 02/25/22   [provider]  Continuous Blood Gluc Receiver (FREESTYLE LIBRE 2 READER) DEVI USE TO CHECK SUGARS DAILY 10/28/22   Evern Bio, NP  Continuous Blood Gluc Sensor (FREESTYLE LIBRE 2 SENSOR) MISC 1 each by Does not apply route every 14 (fourteen) days. 10/22/22   Teodora Medici, DO  diazepam (VALIUM) 10 MG tablet Take 10 mg by mouth 3 (three) times daily. 03/27/22   [provider]  fluticasone-salmeterol  (ADVAIR) 100-50 MCG/ACT AEPB Inhale 1 puff into the lungs 2 (two) times daily. 08/05/22   Teodora Medici, DO  furosemide (LASIX) 40 MG tablet Take 1 tablet (40 mg total) by mouth 2 (two) times daily as needed for edema (take 40 mg in the morning, can take additional 40 mg in afternoon if needed for edema). 10/04/22   Teodora Medici, DO  lamoTRIgine (LAMICTAL) 100 MG tablet Take 100 mg by mouth 2 (two) times daily.    [provider]  metFORMIN (GLUCOPHAGE-XR) 500 MG 24 hr tablet Take 1 tablet (500 mg total) by mouth 2 (two) times daily with a meal. 06/12/22   Teodora Medici, DO  omeprazole (PRILOSEC) 40 MG capsule Take 1 capsule (40 mg total) by mouth daily. 10/22/22   Teodora Medici, DO  pioglitazone (ACTOS) 15 MG tablet Take 1 tablet by mouth once daily 09/30/22   Teodora Medici, DO  potassium chloride SA (KLOR-CON M20) 20 MEQ tablet TAKE 1 TABLET BY MOUTH TWICE DAILY FOR THREE DAYS. THEN TAKE ONE TABLET DAILY WITH LASIX 09/23/22   Teodora Medici, DO  prazosin (MINIPRESS) 2 MG capsule Take 4 mg by mouth every evening. 02/15/21   [provider]  traZODone (DESYREL) 150 MG tablet Take by mouth at bedtime.    [provider]  Vilazodone HCl (VIIBRYD) 40 MG TABS Take 40 mg by mouth daily.    [provider]  Allergies    Gabapentin, Ibuprofen, Sulfa antibiotics, Tylenol [acetaminophen], Aspirin, Buprenorphine hcl, Carbamazepine, Codeine, Compazine, Elemental sulfur, Ioxaglate, Ivp dye [iodinated contrast media], Metrizamide, Naproxen, Norco [hydrocodone-acetaminophen], Other, Penicillin g, Prochlorperazine maleate, Reglan [metoclopramide], Toradol [ketorolac tromethamine], Tramadol, and Zofran [ondansetron hcl]    Review of Systems   Review of Systems  All other systems reviewed and are negative.   Physical Exam Updated Vital Signs BP 118/68   Pulse 91   Temp (!) 97.5 F (36.4 C) (Oral)   Resp 15   SpO2 93%  Physical Exam Vitals and  nursing note reviewed.  Constitutional:      General: She is not in acute distress.    Appearance: Normal appearance.  HENT:     Head: Normocephalic and atraumatic.  Eyes:     General:        Right eye: No discharge.        Left eye: No discharge.  Cardiovascular:     Rate and Rhythm: Normal rate and regular rhythm.     Heart sounds: No murmur heard.    No friction rub. No gallop.  Pulmonary:     Effort: Pulmonary effort is normal.     Breath sounds: Normal breath sounds.  Abdominal:     General: Bowel sounds are normal.     Palpations: Abdomen is soft.  Skin:    General: Skin is warm and dry.     Capillary Refill: Capillary refill takes less than 2 seconds.  Neurological:     Mental Status: She is alert and oriented to person, place, and time.     Comments: Cranial nerves II through XII grossly intact.  Intact finger-nose, intact heel-to-shin.  Romberg negative, gait normal.  Alert and oriented x3.  Moves all 4 limbs spontaneously, normal coordination.  No pronator drift.  Intact strength 5 out of 5 bilateral upper and lower extremities.    Psychiatric:        Mood and Affect: Mood normal.        Behavior: Behavior normal.     ED Results / Procedures / Treatments   Labs (all labs ordered are listed, but only abnormal results are displayed) Labs Reviewed  BASIC METABOLIC PANEL - Abnormal; Notable for the following components:      Result Value   Glucose, Bld 208 (*)    All other components within normal limits  CBG MONITORING, ED - Abnormal; Notable for the following components:   Glucose-Capillary 254 (*)    All other components within normal limits  CBC    EKG None  Radiology CT Head Wo Contrast  Result Date: 12/02/2022 CLINICAL DATA:  Right-sided headache. EXAM: CT HEAD WITHOUT CONTRAST TECHNIQUE: Contiguous axial images were obtained from the base of the skull through the vertex without intravenous contrast. RADIATION DOSE REDUCTION: This exam was performed  according to the departmental dose-optimization program which includes automated exposure control, adjustment of the mA and/or kV according to patient size and/or use of iterative reconstruction technique. COMPARISON:  Head CT 09/15/2022 and MRI 10/30/2022 FINDINGS: Brain: There is no evidence of an acute infarct, intracranial hemorrhage, mass, midline shift, or extra-axial fluid collection. A chronic left temporo-occipital infarct is again noted with ex vacuo dilatation of the adjacent left lateral ventricle. Vascular: Calcified atherosclerosis at the skull base. No hyperdense vessel. Skull: No acute fracture or suspicious osseous lesion. Sinuses/Orbits: Mild mucosal thickening in the paranasal sinuses. Clear mastoid air cells. Unremarkable orbits. Other: None. IMPRESSION: 1. No evidence of acute intracranial abnormality.  2. Chronic left temporo-occipital infarct. Electronically Signed   By: Logan Bores M.D.   On: 12/02/2022 16:53    Procedures Procedures    Medications Ordered in ED Medications  ondansetron (ZOFRAN) tablet 4 mg (4 mg Oral Given 12/02/22 1513)  diphenhydrAMINE (BENADRYL) capsule 25 mg (25 mg Oral Given 12/02/22 1513)  sodium chloride 0.9 % bolus 1,000 mL (0 mLs Intravenous Stopped 12/02/22 2013)  ketorolac (TORADOL) 15 MG/ML injection 15 mg (15 mg Intravenous Given 12/02/22 1810)  magnesium sulfate IVPB 2 g 50 mL (0 g Intravenous Stopped 12/02/22 1835)  HYDROmorphone (DILAUDID) injection 1 mg (1 mg Intravenous Given 12/02/22 1956)    ED Course/ Medical Decision Making/ A&P                             Medical Decision Making Amount and/or Complexity of Data Reviewed Labs: ordered. Radiology: ordered.  Risk Prescription drug management.   This patient is a 57 y.o. female  who presents to the ED for concern of headache, photophobia.   Differential diagnoses prior to evaluation: The emergent differential diagnosis includes, but is not limited to,  Stroke, increased ICP,  meningitis, CVA, intracranial tumor, venous sinus thrombosis, migraine, cluster headache, hypertension, drug related, head injury, tension headache, sinusitis, dental abscess, otitis media, TMJ, temporal arteritis, glaucoma, trigeminal neuralgia. . This is not an exhaustive differential.   Past Medical History / Co-morbidities: Hypertension, diabetes, tobacco abuse, hepatitis previous intraventricular hemorrhage  Additional history: Chart reviewed. Pertinent results include: Reviewed lab work, imaging from previous emergency department visits, patient with frequent evaluations for headaches, she additionally has history of frequent neurology visits  Physical Exam: Physical exam performed. The pertinent findings include: Patient without significant focal neurologic deficits, headache, photophobia improved after medication patient somewhat tachycardic and hypertensive on arrival but improved after headache improved.  Lab Tests/Imaging studies: I personally interpreted labs/imaging and the pertinent results include: BMP is notable for mild hyperglycemia, glucose 2 8, CBC is unremarkable..  Independently interpreted CT head which shows chronic left temporal infarct, with no new acute changes noted.  I agree with the radiologist interpretation.    Medications: I ordered medication including fluid bolus, Dilaudid, magnesium, Toradol, Benadryl, Zofran.  I have reviewed the patients home medicines and have made adjustments as needed.   Disposition: After consideration of the diagnostic results and the patients response to treatment, I feel that patient symptoms consistent with headache disorder, migraine, her symptoms are improved and she is stable for discharge at this time.   emergency department workup does not suggest an emergent condition requiring admission or immediate intervention beyond what has been performed at this time. The plan is: as above. The patient is safe for discharge and has been  instructed to return immediately for worsening symptoms, change in symptoms or any other concerns.  Final Clinical Impression(s) / ED Diagnoses Final diagnoses:  Other migraine without status migrainosus, not intractable    Rx / DC Orders ED Discharge Orders     None         Dorien Chihuahua 12/02/22 2117    Regan Lemming, MD 12/02/22 2144
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
## 2022-12-04 ENCOUNTER — Ambulatory Visit: Payer: Medicaid Other | Admitting: Family Medicine

## 2022-12-09 DIAGNOSIS — Z419 Encounter for procedure for purposes other than remedying health state, unspecified: Secondary | ICD-10-CM | POA: Diagnosis not present

## 2022-12-10 ENCOUNTER — Other Ambulatory Visit: Payer: Self-pay | Admitting: Nurse Practitioner

## 2022-12-10 DIAGNOSIS — E1165 Type 2 diabetes mellitus with hyperglycemia: Secondary | ICD-10-CM

## 2022-12-10 NOTE — Progress Notes (Deleted)
Established Patient Office Visit  Subjective   [name]: Stephanie [name], female    DOB: [birth year]  Age: 57 y.o. MRN: [id number]  No chief complaint on file.   HPI Patient is here for follow up.   Diabetes, Type 2: -Last A1c 1/24 7.2% -Medications: Metformin 500 mg BID (had been on Glipizide in the past), Actos 15 mg but uncertain if she is taking this one -Patient is compliant with the above medications and reports no side effects. Diarrhea stopped since switching to Metformin ER -Checking BG at home: average 120-150, yesterday morning was 68 but hadn't eaten a lot the night before -Diet: ate a baked potato for dinner, cheese croissant for breakfast  -Eye exam: UTD 8/23 -Foot exam: UTD 9/23 -Microalbumin: UTD 6/23 -Statin: yes -PNA vaccine: UTD -Denies symptoms of hypoglycemia, polyuria, polydipsia, numbness extremities, foot ulcers/trauma.   HLD/Hx of CVA: -CVA August 2017, follows with Neurology last seen 08/07/22 -Medications: Crestor 20 mg, Plavix 75 mg  -Patient is compliant with above medications and reports no side effects.  -Last lipid panel: Lipid Panel     Component Value Date/Time   CHOL 123 02/13/2022 1211   TRIG 69 02/13/2022 1211   HDL 73 02/13/2022 1211   CHOLHDL 1.7 02/13/2022 1211   VLDL 18 04/22/2016 0357   LDLCALC 35 02/13/2022 1211   COPD: -COPD status: stable -Current medications: Advair BID switched at LOV due to insurance coverage, Albuterol PRN -Satisfied with current treatment?: yes - doesn't ike the Advair as much as the [employer] but overall doing well  -Oxygen use: no -Dyspnea frequency: occasional  -Cough frequency: no -Rescue inhaler frequency:  more recently due to cold -Limitation of activity: yes -Productive cough: no -Pneumovax: Up to Date -Influenza: Up to Date -Former smoker  Bilateral Nephrolithiasis: -Chronic, following with Urology.  GERD: -Currently on Prilosec 40 mg daily, doing well   Depression: -Follows with  Psychiatry at [facility]  -Currently on Abilify 15 mg, Caplyta 42 mg, Vviibryd 40 mg and Trazodone 50 mg, Valium PRN  Patient Active Problem List   Diagnosis Date Noted   Hypokalemia 04/17/2022   Leg swelling 02/13/2022   Wheezing 02/13/2022   Migraine 05/12/2020   Type 2 diabetes mellitus 08/22/2018   Other specified cardiac dysrhythmias 08/22/2018   Acute encephalopathy 08/21/2018   Kidney stone 03/10/2018   UTI (urinary tract infection) 08/21/2017   History of CVA (cerebrovascular accident) 01/28/2017   History of migraine 01/28/2017   Small bowel obstruction 09/02/2016   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   Bipolar disorder 05/09/2016   Hyperlipidemia, unspecified 05/09/2016   Irritable bowel syndrome 05/09/2016   Lumbago with sciatica 05/09/2016   Esophageal reflux 05/09/2016   Scoliosis 05/09/2016   Diverticulosis of colon 05/09/2016   DDD (degenerative disc disease), cervical 05/09/2016   ICH (intracerebral hemorrhage)    Essential hypertension, malignant 04/19/2016   Cytotoxic brain edema 04/19/2016   IVH (intraventricular hemorrhage) 04/18/2016   Bilateral carpal tunnel syndrome 04/12/2016   Falls frequently 01/12/2016   Trigeminal neuralgia 06/08/2015   Sacroiliac dysfunction 03/04/2014   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 or radiculopathy 05/20/2013   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   Tobacco use disorder 09/11/2012   History of cervical cancer 07/30/2012   Hepatitis C    Past Medical History:  Diagnosis Date   Closed fracture of shaft of humerus 01/20/2017   Diabetes mellitus without complication ([place])    Difficult intubation    Diverticulitis    Headache  Hepatitis C    treated and resolved   History of kidney stones    Hypertension    Hypotension 08/22/2018   IBS (irritable bowel syndrome)    Kidney stones    Sciatica    Sleep apnea    on cpap   Stroke ([place]) 2017   memory loss   Superficial venous thrombosis of  arm, left 07/22/2016   Past Surgical History:  Procedure Laterality Date   ABDOMINAL HYSTERECTOMY  2007   carpel tunnel syndrome  2017   HARDWARE REMOVAL Right 12/22/2017   Procedure: HARDWARE REMOVAL-RIGHT ARM;  Surgeon: Lovell Sheehan, MD;  Location: [facility] ORS;  Service: Orthopedics;  Laterality: Right;  Removal of implant, superficial right humerus   HUMERUS IM NAIL Right 03/11/2017   Procedure: INTRAMEDULLARY (IM) NAIL HUMERAL;  Surgeon: Lovell Sheehan, MD;  Location: [facility] OR[facility];  Service: Orthopedics;  Laterality: Right;   KIDNEY STONE SURGERY Right 02/12/2012   KIDNEY STONE SURGERY Left 07/15/2012   LIGATION OF ARTERIOVENOUS  FISTULA Left 06/21/2016   Procedure: LIGATION OF ARTERIOVENOUS  FISTULA ( LIGATION BASILIC VEIN );  Surgeon: Katha Cabal, MD;  Location: [facility] ORS;  Service: Vascular;  Laterality: Left;   LITHOTRIPSY     OOPHORECTOMY     SINUS EXPLORATION     TONSILLECTOMY     Social History   Tobacco Use   Smoking status: Every Day    Packs/day: 1.00    Years: 1.50    Additional pack years: 0.00    Total pack years: 1.50    Types: Cigarettes   Smokeless tobacco: Never  Vaping Use   Vaping Use: Never used  Substance Use Topics   Alcohol use: Not Currently    Comment: no alcohol since 2002   Drug use: No   Social History   Socioeconomic History   Marital status: Legally Separated    Spouse name: Not on file   Number of children: Not on file   Years of education: Not on file   Highest education level: Not on file  Occupational History   Not on file  Tobacco Use   Smoking status: Every Day    Packs/day: 1.00    Years: 1.50    Additional pack years: 0.00    Total pack years: 1.50    Types: Cigarettes   Smokeless tobacco: Never  Vaping Use   Vaping Use: Never used  Substance and Sexual Activity   Alcohol use: Not Currently    Comment: no alcohol since 2002   Drug use: No   Sexual activity: Not on file  Other Topics Concern   Not on file   Social History Narrative   Not on file   Social Determinants of Health   Financial Resource Strain: Not on file  Food Insecurity: No Food Insecurity (11/12/2022)   Hunger Vital Sign    Worried About Running Out of Food in the Last Year: Never true    Ran Out of Food in the Last Year: Never true  Transportation Needs: Unmet Transportation Needs (11/12/2022)   PRAPARE - Hydrologist (Medical): Yes    Lack of Transportation (Non-Medical): No  Physical Activity: Not on file  Stress: Not on file  Social Connections: Not on file  Intimate Partner Violence: Not on file   Family Status  Relation Name Status   Mother  Deceased   Father  Alive   Sister  Deceased   Family History  Problem Relation Age  of Onset   Heart failure Mother    Diabetes Father    Cancer Sister    Allergies  Allergen Reactions   Gabapentin Itching   Ibuprofen Itching   Sulfa Antibiotics Hives   Tylenol [Acetaminophen] Itching   Aspirin Itching   Buprenorphine Hcl Itching   Carbamazepine Itching, Other (See Comments) and Rash    Makes her feel like something is crawling under her skin.   Codeine Itching and Other (See Comments)    Makes her feel like something is crawling under her skin. Can take, sometimes makes her itch   Compazine Itching and Other (See Comments)    "makes my skin crawl"   Elemental Sulfur Hives, Rash and Other (See Comments)    Skin Rashes, Hives   Ioxaglate Itching   Ivp Dye [Iodinated Contrast Media] Itching and Other (See Comments)    Makes her itch really bad. Had to get 2-3 shots of Benadryl when she was in the hospital.   Metrizamide Itching   Naproxen Itching   Norco [Hydrocodone-Acetaminophen] Itching   Other Itching and Other (See Comments)    Makes her itch really bad. Had to get 2-3 shots of Benadryl when she was in the hospital.   Penicillin G Hives, Rash and Other (See Comments)    Has patient had a PCN reaction causing immediate rash,  facial/tongue/throat swelling, SOB or lightheadedness with hypotension: Yes Has patient had a PCN reaction causing severe rash involving mucus membranes or skin necrosis: No Has patient had a PCN reaction that required hospitalization: No Has patient had a PCN reaction occurring within the last 10 years: No If all of the above answers are "NO", then may proceed with Cephalosporin use.    Prochlorperazine Maleate Other (See Comments)    Compazine makes her skin crawl - needs 2-3 shots of Benadryl to get relief.   Reglan [Metoclopramide] Itching and Other (See Comments)    Makes her skin crawl   Toradol [Ketorolac Tromethamine] Itching   Tramadol Itching   Zofran [Ondansetron Hcl] Itching    Review of Systems  Constitutional:  Negative for chills and fever.  Respiratory:  Negative for cough, shortness of breath and wheezing.   Cardiovascular:  Negative for chest pain.  Gastrointestinal:  Negative for abdominal pain, heartburn, nausea and vomiting.      Objective:     There were no vitals taken for this visit. BP Readings from Last 3 Encounters:  12/02/22 118/68  11/20/22 127/69  11/17/22 126/88   Wt Readings from Last 3 Encounters:  11/17/22 198 lb 6.6 oz (90 kg)  10/16/22 197 lb 12.8 oz (89.7 kg)  10/10/22 184 lb (83.5 kg)     Physical Exam Constitutional:      Appearance: Normal appearance.  HENT:     Head: Normocephalic and atraumatic.  Eyes:     Conjunctiva/sclera: Conjunctivae normal.  Cardiovascular:     Rate and Rhythm: Normal rate and regular rhythm.  Pulmonary:     Effort: Pulmonary effort is normal.     Breath sounds: Normal breath sounds.  Skin:    General: Skin is warm and dry.     Comments: New nose piercing on left side, not infected but bleeding slightly  Neurological:     Mental Status: She is alert. Mental status is at baseline.  Psychiatric:        Mood and Affect: Affect is flat.        Speech: Speech is delayed.  Behavior: Behavior  normal.        Thought Content: Thought content normal.     No results found for any visits on 12/11/22.   Last CBC Lab Results  Component Value Date   WBC 5.6 12/02/2022   HGB 13.0 12/02/2022   HCT 41.3 12/02/2022   MCV 98.8 12/02/2022   MCH 31.1 12/02/2022   RDW 13.7 12/02/2022   PLT 167 [id number]   Last metabolic panel Lab Results  Component Value Date   GLUCOSE 208 (H) 12/02/2022   NA 140 12/02/2022   K 3.7 12/02/2022   CL 104 12/02/2022   CO2 28 12/02/2022   BUN 18 12/02/2022   CREATININE 0.79 12/02/2022   GFRNONAA >60 12/02/2022   CALCIUM 9.4 12/02/2022   PHOS 2.3 (L) 08/22/2018   PROT 7.1 11/17/2022   ALBUMIN 3.9 11/17/2022   BILITOT 0.7 11/17/2022   ALKPHOS 110 11/17/2022   AST 24 11/17/2022   ALT 19 11/17/2022   ANIONGAP 8 12/02/2022   Last lipids Lab Results  Component Value Date   CHOL 123 02/13/2022   HDL 73 02/13/2022   LDLCALC 35 02/13/2022   TRIG 69 02/13/2022   CHOLHDL 1.7 02/13/2022   Last hemoglobin A1c Lab Results  Component Value Date   HGBA1C 7.2 (A) 09/10/2022   Last thyroid functions Lab Results  Component Value Date   TSH 1.218 04/16/2022   Last vitamin D No results found for: "25OHVITD2", "25OHVITD3", "VD25OH" Last vitamin B12 and Folate Lab Results  Component Value Date   [id number] 02/20/2022      The ASCVD Risk score (Arnett DK, et al., 2019) failed to calculate for the following reasons:   The patient has a prior MI or stroke diagnosis    Assessment & Plan:   1. Type 2 diabetes mellitus with hyperglycemia, without long-term current use of insulin ([place]): A1c in the office improved at 7.2%.  She is doing much better on the metformin extended release 500 mg twice daily.  She was also prescribed Actos 15 mg daily, patient is uncertain if she is taking this but will check at home and let me know.  Otherwise A1c is better controlled, continue current medications.  Follow-up in 3 months.  - POCT HgB A1C  2.  Pulmonary emphysema, unspecified emphysema type ([place]): Stable, doing ok with Advair, Albuterol PRN.  3. Gastroesophageal reflux disease, unspecified whether esophagitis present: Stable.  Continue Prilosec 40 mg.  4. Leg swelling: Improved.  Patient is taking Lasix as needed for lower extremity swelling which she has not had to take in about 1 month.  Reiterated with the patient to only take potassium when she is taking the Lasix.  A few months ago she was having some orthostatic hypotension related to the Lasix with recurrent falls, which has improved.   No follow-ups on file.    Teodora Medici, DO

## 2022-12-11 ENCOUNTER — Ambulatory Visit: Payer: Medicaid Other | Admitting: Internal Medicine

## 2022-12-12 ENCOUNTER — Other Ambulatory Visit: Payer: Self-pay | Admitting: Physical Medicine and Rehabilitation

## 2022-12-12 DIAGNOSIS — M5481 Occipital neuralgia: Secondary | ICD-10-CM

## 2022-12-12 DIAGNOSIS — M542 Cervicalgia: Secondary | ICD-10-CM

## 2022-12-12 NOTE — Progress Notes (Signed)
Established Patient Office Visit  Subjective   [name]: Stephanie [name], female    DOB: Feb 16, 1966  Age: 57 y.o. MRN: [id number]  Chief Complaint  Patient presents with   Follow-up   Diabetes    HPI Patient is here for follow up. Patient states she is having pain in her right knee today. She does have an appointment with Orthopedics next week but is in pain and wanting pain medications.   Diabetes, Type 2: -Last A1c 1/24 7.2% -Medications: Metformin 500 mg BID (had been on Glipizide in the past). Had been prescribed Actos but not currently taking -Patient is compliant with the above medications and reports no side effects. Diarrhea stopped since switching to Metformin ER -Checking BG at home: average 120-150 -Diet: ate a baked potato for dinner, cheese croissant for breakfast  -Eye exam: UTD 8/23 -Foot exam: UTD 9/23 -Microalbumin: UTD 6/23 -Statin: yes -PNA vaccine: UTD -Denies symptoms of hypoglycemia, polyuria, polydipsia, numbness extremities, foot ulcers/trauma.   HLD/Hx of CVA: -CVA August 2017, follows with Neurology last seen 08/07/22 -Medications: Crestor 20 mg, Plavix 75 mg  -Patient is compliant with above medications and reports no side effects.  -Last lipid panel: Lipid Panel     Component Value Date/Time   CHOL 123 02/13/2022 1211   TRIG 69 02/13/2022 1211   HDL 73 02/13/2022 1211   CHOLHDL 1.7 02/13/2022 1211   VLDL 18 04/22/2016 0357   LDLCALC 35 02/13/2022 1211   COPD: -COPD status: stable -Current medications: Advair BID switched at [facility]OV due to insurance coverage, Albuterol PRN -Satisfied with current treatment?: yes - doesn't ike the Advair as much as the [employer] but overall doing well  -Oxygen use: no -Dyspnea frequency: occasional  -Cough frequency: no -Rescue inhaler frequency:  more recently due to cold -Limitation of activity: yes -Productive cough: no -Pneumovax: Up to Date -Influenza: Up to Date -Former smoker  Bilateral  Nephrolithiasis: -Chronic, following with Urology.  GERD: -Currently on Prilosec 40 mg daily, doing well   Depression: -Follows with Psychiatry at [facility]  -Currently on Abilify 15 mg, Caplyta 42 mg, Vviibryd 40 mg and Trazodone 50 mg, Valium PRN  Patient Active Problem List   Diagnosis Date Noted   Hypokalemia 04/17/2022   Leg swelling 02/13/2022   Wheezing 02/13/2022   Migraine 05/12/2020   Type 2 diabetes mellitus 08/22/2018   Other specified cardiac dysrhythmias 08/22/2018   Acute encephalopathy 08/21/2018   Kidney stone 03/10/2018   UTI (urinary tract infection) 08/21/2017   History of CVA (cerebrovascular accident) 01/28/2017   History of migraine 01/28/2017   Small bowel obstruction 09/02/2016   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   Bipolar disorder 05/09/2016   Hyperlipidemia, unspecified 05/09/2016   Irritable bowel syndrome 05/09/2016   Lumbago with sciatica 05/09/2016   Esophageal reflux 05/09/2016   Scoliosis 05/09/2016   Diverticulosis of colon 05/09/2016   DDD (degenerative disc disease), cervical 05/09/2016   ICH (intracerebral hemorrhage)    Essential hypertension, malignant 04/19/2016   Cytotoxic brain edema 04/19/2016   IVH (intraventricular hemorrhage) 04/18/2016   Bilateral carpal tunnel syndrome 04/12/2016   Falls frequently 01/12/2016   Trigeminal neuralgia 06/08/2015   Sacroiliac dysfunction 03/04/2014   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 or radiculopathy 05/20/2013   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   Tobacco use disorder 09/11/2012   History of cervical cancer 07/30/2012   Hepatitis C    Past Medical History:  Diagnosis Date   Closed fracture of shaft of humerus 01/20/2017  Diabetes mellitus without complication    Difficult intubation    Diverticulitis    Headache    Hepatitis C    treated and resolved   History of kidney stones    Hypertension    Hypotension 08/22/2018   IBS (irritable bowel  syndrome)    Kidney stones    Sciatica    Sleep apnea    on cpap   Stroke 2017   memory loss   Superficial venous thrombosis of arm, left 07/22/2016   Past Surgical History:  Procedure Laterality Date   ABDOMINAL HYSTERECTOMY  2007   carpel tunnel syndrome  2017   HARDWARE REMOVAL Right 12/22/2017   Procedure: HARDWARE REMOVAL-RIGHT ARM;  Surgeon: Lyndle Herrlich, MD;  Location: [facility] ORS;  Service: Orthopedics;  Laterality: Right;  Removal of implant, superficial right humerus   HUMERUS IM NAIL Right 03/11/2017   Procedure: INTRAMEDULLARY (IM) NAIL HUMERAL;  Surgeon: Lyndle Herrlich, MD;  Location: [facility] OR[facility];  Service: Orthopedics;  Laterality: Right;   KIDNEY STONE SURGERY Right 02/12/2012   KIDNEY STONE SURGERY Left 07/15/2012   LIGATION OF ARTERIOVENOUS  FISTULA Left 06/21/2016   Procedure: LIGATION OF ARTERIOVENOUS  FISTULA ( LIGATION BASILIC VEIN );  Surgeon: Renford Dills, MD;  Location: [facility] ORS;  Service: Vascular;  Laterality: Left;   LITHOTRIPSY     OOPHORECTOMY     SINUS EXPLORATION     TONSILLECTOMY     Social History   Tobacco Use   Smoking status: Every Day    Packs/day: 1.00    Years: 1.50    Additional pack years: 0.00    Total pack years: 1.50    Types: Cigarettes   Smokeless tobacco: Never  Vaping Use   Vaping Use: Never used  Substance Use Topics   Alcohol use: Not Currently    Comment: no alcohol since 2002   Drug use: No   Social History   Socioeconomic History   Marital status: Legally Separated    Spouse name: Not on file   Number of children: Not on file   Years of education: Not on file   Highest education level: Not on file  Occupational History   Not on file  Tobacco Use   Smoking status: Every Day    Packs/day: 1.00    Years: 1.50    Additional pack years: 0.00    Total pack years: 1.50    Types: Cigarettes   Smokeless tobacco: Never  Vaping Use   Vaping Use: Never used  Substance and Sexual Activity   Alcohol use: Not  Currently    Comment: no alcohol since 2002   Drug use: No   Sexual activity: Not on file  Other Topics Concern   Not on file  Social History Narrative   Not on file   Social Determinants of Health   Financial Resource Strain: Not on file  Food Insecurity: No Food Insecurity (11/12/2022)   Hunger Vital Sign    Worried About Running Out of Food in the Last Year: Never true    Ran Out of Food in the Last Year: Never true  Transportation Needs: Unmet Transportation Needs (11/12/2022)   PRAPARE - Administrator, Civil Service (Medical): Yes    Lack of Transportation (Non-Medical): No  Physical Activity: Not on file  Stress: Not on file  Social Connections: Not on file  Intimate Partner Violence: Not on file   Family Status  Relation Name Status   Mother  Deceased   Father  Alive   Sister  Deceased   Family History  Problem Relation Age of Onset   Heart failure Mother    Diabetes Father    Cancer Sister    Allergies  Allergen Reactions   Gabapentin Itching   Ibuprofen Itching   Sulfa Antibiotics Hives   Tylenol [Acetaminophen] Itching   Aspirin Itching   Buprenorphine Hcl Itching   Carbamazepine Itching, Other (See Comments) and Rash    Makes her feel like something is crawling under her skin.   Codeine Itching and Other (See Comments)    Makes her feel like something is crawling under her skin. Can take, sometimes makes her itch   Compazine Itching and Other (See Comments)    "makes my skin crawl"   Elemental Sulfur Hives, Rash and Other (See Comments)    Skin Rashes, Hives   Ioxaglate Itching   Ivp Dye [Iodinated Contrast Media] Itching and Other (See Comments)    Makes her itch really bad. Had to get 2-3 shots of Benadryl when she was in the hospital.   Metrizamide Itching   Naproxen Itching   Norco [Hydrocodone-Acetaminophen] Itching   Other Itching and Other (See Comments)    Makes her itch really bad. Had to get 2-3 shots of Benadryl when she was  in the hospital.   Penicillin G Hives, Rash and Other (See Comments)    Has patient had a PCN reaction causing immediate rash, facial/tongue/throat swelling, SOB or lightheadedness with hypotension: Yes Has patient had a PCN reaction causing severe rash involving mucus membranes or skin necrosis: No Has patient had a PCN reaction that required hospitalization: No Has patient had a PCN reaction occurring within the last 10 years: No If all of the above answers are "NO", then may proceed with Cephalosporin use.    Prochlorperazine Maleate Other (See Comments)    Compazine makes her skin crawl - needs 2-3 shots of Benadryl to get relief.   Reglan [Metoclopramide] Itching and Other (See Comments)    Makes her skin crawl   Toradol [Ketorolac Tromethamine] Itching   Tramadol Itching   Zofran [Ondansetron Hcl] Itching    Review of Systems  Constitutional:  Negative for chills and fever.  Respiratory:  Negative for cough, shortness of breath and wheezing.   Cardiovascular:  Negative for chest pain.  Gastrointestinal:  Negative for abdominal pain, heartburn, nausea and vomiting.  Musculoskeletal:  Positive for joint pain.      Objective:     BP 126/74   Pulse 99   Temp 97.9 F (36.6 C) (Oral)   Resp 16   Ht 5\' 2"  (1.575 m)   Wt 209 lb 11.2 oz (95.1 kg)   SpO2 98%   BMI 38.35 kg/m  BP Readings from Last 3 Encounters:  12/13/22 126/74  12/02/22 118/68  11/20/22 127/69   Wt Readings from Last 3 Encounters:  12/13/22 209 lb 11.2 oz (95.1 kg)  11/17/22 198 lb 6.6 oz (90 kg)  10/16/22 197 lb 12.8 oz (89.7 kg)     Physical Exam Constitutional:      Appearance: Normal appearance.  HENT:     Head: Normocephalic and atraumatic.  Eyes:     Conjunctiva/sclera: Conjunctivae normal.  Cardiovascular:     Rate and Rhythm: Normal rate and regular rhythm.  Pulmonary:     Effort: Pulmonary effort is normal.     Breath sounds: Normal breath sounds.  Musculoskeletal:     Right lower  leg:  Edema present.     Left lower leg: Edema present.     Comments: Chronic BLE edema  Skin:    General: Skin is warm and dry.  Neurological:     Mental Status: She is alert. Mental status is at baseline.  Psychiatric:        Mood and Affect: Affect is flat.        Speech: Speech is delayed.        Behavior: Behavior normal.        Thought Content: Thought content normal.     Results for orders placed or performed in visit on 12/13/22  POCT HgB A1C  Result Value Ref Range   Hemoglobin A1C 6.8 (A) 4.0 - 5.6 %   HbA1c POC (<> result, manual entry)     HbA1c, POC (prediabetic range)     HbA1c, POC (controlled diabetic range)       Last CBC Lab Results  Component Value Date   WBC 5.6 12/02/2022   HGB 13.0 12/02/2022   HCT 41.3 12/02/2022   MCV 98.8 12/02/2022   MCH 31.1 12/02/2022   RDW 13.7 12/02/2022   PLT 167 12/02/2022   Last metabolic panel Lab Results  Component Value Date   GLUCOSE 208 (H) 12/02/2022   NA 140 12/02/2022   K 3.7 12/02/2022   CL 104 12/02/2022   CO2 28 12/02/2022   BUN 18 12/02/2022   CREATININE 0.79 12/02/2022   GFRNONAA >60 12/02/2022   CALCIUM 9.4 12/02/2022   PHOS 2.3 (L) 08/22/2018   PROT 7.1 11/17/2022   ALBUMIN 3.9 11/17/2022   BILITOT 0.7 11/17/2022   ALKPHOS 110 11/17/2022   AST 24 11/17/2022   ALT 19 11/17/2022   ANIONGAP 8 12/02/2022   Last lipids Lab Results  Component Value Date   CHOL 123 02/13/2022   HDL 73 02/13/2022   LDLCALC 35 02/13/2022   TRIG 69 02/13/2022   CHOLHDL 1.7 02/13/2022   Last hemoglobin A1c Lab Results  Component Value Date   HGBA1C 6.8 (A) 12/13/2022   Last thyroid functions Lab Results  Component Value Date   TSH 1.218 04/16/2022   Last vitamin D No results found for: "25OHVITD2", "25OHVITD3", "VD25OH" Last vitamin B12 and Folate Lab Results  Component Value Date   VITAMINB12 380 02/20/2022      The ASCVD Risk score (Arnett DK, et al., 2019) failed to calculate for the following  reasons:   The patient has a prior MI or stroke diagnosis    Assessment & Plan:   1. Type 2 diabetes mellitus with hyperglycemia, without long-term current use of insulin: A1c today controlled at 6.8%. Continue Metformin 500 mg BID, refilled. Refill Free Style Libre sensors as well.   - POCT HgB A1C - Continuous Blood Gluc Sensor (FREESTYLE LIBRE 2 SENSOR) MISC; 1 each by Does not apply route every 14 (fourteen) days.  Dispense: 4 each; Refill: 4 - metFORMIN (GLUCOPHAGE-XR) 500 MG 24 hr tablet; Take 1 tablet (500 mg total) by mouth 2 (two) times daily with a meal.  Dispense: 180 tablet; Refill: 1  2. Leg swelling: Still present, continue Lasix 40 mg BID. Follow up in 1 month to recheck labs but kidney function the end of March normal.   - furosemide (LASIX) 40 MG tablet; Take 1 tablet (40 mg total) by mouth 2 (two) times daily as needed for edema (take 40 mg in the morning, can take additional 40 mg in afternoon if needed for edema).  Dispense: 30  tablet; Refill: 1  3. Hypokalemia: Refill KCl supplements 20 meQ BID, recheck labs at follow up.   - potassium chloride SA (KLOR-CON M20) 20 MEQ tablet; Take 1 tablet (20 mEq total) by mouth 2 (two) times daily.  Dispense: 90 tablet; Refill: 1  4. Acute pain of right knee: Seeing Orthopedics for evaluation soon, will give a Toradol injection here for pain today.   - ketorolac (TORADOL) injection 60 mg  5. Encounter for screening mammogram for malignant neoplasm of breast: Mammogram ordered.   - MM 3D SCREENING MAMMOGRAM BILATERAL BREAST; Future   Return in about 4 weeks (around 01/10/2023).    Margarita Mail, DO

## 2022-12-13 ENCOUNTER — Ambulatory Visit (INDEPENDENT_AMBULATORY_CARE_PROVIDER_SITE_OTHER): Payer: Medicaid Other | Admitting: Internal Medicine

## 2022-12-13 ENCOUNTER — Encounter: Payer: Self-pay | Admitting: Internal Medicine

## 2022-12-13 VITALS — BP 126/74 | HR 99 | Temp 97.9°F | Resp 16 | Ht 62.0 in | Wt 209.7 lb

## 2022-12-13 DIAGNOSIS — E876 Hypokalemia: Secondary | ICD-10-CM

## 2022-12-13 DIAGNOSIS — M25561 Pain in right knee: Secondary | ICD-10-CM

## 2022-12-13 DIAGNOSIS — M7989 Other specified soft tissue disorders: Secondary | ICD-10-CM

## 2022-12-13 DIAGNOSIS — Z1231 Encounter for screening mammogram for malignant neoplasm of breast: Secondary | ICD-10-CM | POA: Diagnosis not present

## 2022-12-13 DIAGNOSIS — E1165 Type 2 diabetes mellitus with hyperglycemia: Secondary | ICD-10-CM | POA: Diagnosis not present

## 2022-12-13 DIAGNOSIS — Z7689 Persons encountering health services in other specified circumstances: Secondary | ICD-10-CM | POA: Diagnosis not present

## 2022-12-13 LAB — POCT GLYCOSYLATED HEMOGLOBIN (HGB A1C): Hemoglobin A1C: 6.8 % — AB (ref 4.0–5.6)

## 2022-12-13 MED ORDER — FUROSEMIDE 40 MG PO TABS: 40.0000 mg | ORAL_TABLET | Freq: Two times a day (BID) | ORAL | 1 refills | Status: DC | PRN

## 2022-12-13 MED ORDER — FREESTYLE LIBRE 2 SENSOR MISC: 1.0000 | 4 refills | Status: DC

## 2022-12-13 MED ORDER — POTASSIUM CHLORIDE CRYS ER 20 MEQ PO TBCR: 20.0000 meq | EXTENDED_RELEASE_TABLET | Freq: Two times a day (BID) | ORAL | 1 refills | Status: DC

## 2022-12-13 MED ORDER — METFORMIN HCL ER 500 MG PO TB24: 500.0000 mg | ORAL_TABLET | Freq: Two times a day (BID) | ORAL | 1 refills | Status: DC

## 2022-12-13 MED ORDER — KETOROLAC TROMETHAMINE 60 MG/2ML IM SOLN
60.0000 mg | Freq: Once | INTRAMUSCULAR | Status: AC
  Administered 2022-12-13: 30 mg via INTRAMUSCULAR

## 2022-12-13 NOTE — Patient Instructions (Signed)
It was great seeing you today!  Plan discussed at today's visit: -Medications refilled -A1c controlled at 6.8%, which is excellent  -Toradol injection given today, be sure to follow up with Orthopedics   Follow up in: 1 month for labs   Take care and let us know if you have any questions or concerns prior to your next visit.  Dr. Caralee Ates

## 2022-12-16 ENCOUNTER — Telehealth: Payer: Self-pay | Admitting: Internal Medicine

## 2022-12-16 NOTE — Telephone Encounter (Unsigned)
Copied from CRM [id number]. Topic: Referral - Request for Referral >> Dec 16, 2022 12:52 PM Marlow Baars wrote: Has patient seen PCP for this complaint? Yes.  She discussed this briefly at her last visit with her provider Referral for which specialty: Pain management Preferred provider/office: [facility] that accepts her insurance at [facility] Reason for referral: Pain management for her knee and shoulder  Please assist patient further

## 2022-12-17 NOTE — Telephone Encounter (Signed)
See's ortho firday

## 2022-12-18 ENCOUNTER — Ambulatory Visit
Admission: RE | Admit: 2022-12-18 | Discharge: 2022-12-18 | Disposition: A | Discharge status: Home / Self Care | Payer: Medicaid Other | Source: Ambulatory Visit | Attending: Physical Medicine and Rehabilitation | Admitting: Physical Medicine and Rehabilitation

## 2022-12-18 DIAGNOSIS — M5481 Occipital neuralgia: Secondary | ICD-10-CM

## 2022-12-18 DIAGNOSIS — M47812 Spondylosis without myelopathy or radiculopathy, cervical region: Secondary | ICD-10-CM | POA: Diagnosis not present

## 2022-12-18 DIAGNOSIS — M542 Cervicalgia: Secondary | ICD-10-CM | POA: Diagnosis not present

## 2022-12-23 ENCOUNTER — Emergency Department (HOSPITAL_COMMUNITY)
Admission: EM | Admit: 2022-12-23 | Discharge: 2022-12-23 | Disposition: A | Discharge status: Home / Self Care | Payer: Medicaid Other | Attending: Emergency Medicine | Admitting: Emergency Medicine

## 2022-12-23 ENCOUNTER — Other Ambulatory Visit: Payer: Self-pay

## 2022-12-23 ENCOUNTER — Emergency Department (HOSPITAL_COMMUNITY): Payer: Medicaid Other

## 2022-12-23 DIAGNOSIS — E1165 Type 2 diabetes mellitus with hyperglycemia: Secondary | ICD-10-CM | POA: Diagnosis not present

## 2022-12-23 DIAGNOSIS — Z79899 Other long term (current) drug therapy: Secondary | ICD-10-CM | POA: Diagnosis not present

## 2022-12-23 DIAGNOSIS — R519 Headache, unspecified: Secondary | ICD-10-CM | POA: Diagnosis not present

## 2022-12-23 DIAGNOSIS — I2510 Atherosclerotic heart disease of native coronary artery without angina pectoris: Secondary | ICD-10-CM | POA: Diagnosis not present

## 2022-12-23 DIAGNOSIS — I10 Essential (primary) hypertension: Secondary | ICD-10-CM | POA: Insufficient documentation

## 2022-12-23 DIAGNOSIS — Z7984 Long term (current) use of oral hypoglycemic drugs: Secondary | ICD-10-CM | POA: Insufficient documentation

## 2022-12-23 DIAGNOSIS — Z7902 Long term (current) use of antithrombotics/antiplatelets: Secondary | ICD-10-CM | POA: Diagnosis not present

## 2022-12-23 LAB — COMPREHENSIVE METABOLIC PANEL
ALT: 15 U/L (ref 0–44)
AST: 16 U/L (ref 15–41)
Albumin: 3.5 g/dL (ref 3.5–5.0)
Alkaline Phosphatase: 101 U/L (ref 38–126)
Anion gap: 7 (ref 5–15)
BUN: 12 mg/dL (ref 6–20)
CO2: 26 mmol/L (ref 22–32)
Calcium: 8.8 mg/dL — ABNORMAL LOW (ref 8.9–10.3)
Chloride: 105 mmol/L (ref 98–111)
Creatinine, Ser: 0.81 mg/dL (ref 0.44–1.00)
GFR, Estimated: >60 mL/min (ref >60)
Glucose, Bld: 166 mg/dL — ABNORMAL HIGH (ref 70–99)
Potassium: 4.1 mmol/L (ref 3.5–5.1)
Sodium: 138 mmol/L (ref 135–145)
Total Bilirubin: 0.5 mg/dL (ref 0.3–1.2)
Total Protein: 6.8 g/dL (ref 6.5–8.1)

## 2022-12-23 LAB — CBC WITH DIFFERENTIAL/PLATELET
Abs Immature Granulocytes: 0.01 10*3/uL (ref 0.00–0.07)
Basophils Absolute: 0 10*3/uL (ref 0.0–0.1)
Basophils Relative: 1 %
Eosinophils Absolute: 0.1 10*3/uL (ref 0.0–0.5)
Eosinophils Relative: 2 %
HCT: 39.4 % (ref 36.0–46.0)
Hemoglobin: 12.5 g/dL (ref 12.0–15.0)
Immature Granulocytes: 0 %
Lymphocytes Relative: 38 %
Lymphs Abs: 1.8 10*3/uL (ref 0.7–4.0)
MCH: 31.5 pg (ref 26.0–34.0)
MCHC: 31.7 g/dL (ref 30.0–36.0)
MCV: 99.2 fL (ref 80.0–100.0)
Monocytes Absolute: 0.5 10*3/uL (ref 0.1–1.0)
Monocytes Relative: 10 %
Neutro Abs: 2.3 10*3/uL (ref 1.7–7.7)
Neutrophils Relative %: 49 %
Platelets: 150 10*3/uL (ref 150–400)
RBC: 3.97 MIL/uL (ref 3.87–5.11)
RDW: 13.2 % (ref 11.5–15.5)
WBC: 4.7 10*3/uL (ref 4.0–10.5)
nRBC: 0 % (ref 0.0–0.2)

## 2022-12-23 MED ORDER — DIPHENHYDRAMINE HCL 50 MG/ML IJ SOLN
12.5000 mg | Freq: Once | INTRAMUSCULAR | Status: AC
  Administered 2022-12-23: 12.5 mg via INTRAVENOUS
  Filled 2022-12-23: qty 1

## 2022-12-23 MED ORDER — MORPHINE SULFATE (PF) 4 MG/ML IV SOLN
4.0000 mg | Freq: Once | INTRAVENOUS | Status: AC
  Administered 2022-12-23: 4 mg via INTRAVENOUS
  Filled 2022-12-23: qty 1

## 2022-12-23 MED ORDER — DROPERIDOL 2.5 MG/ML IJ SOLN
2.5000 mg | Freq: Once | INTRAMUSCULAR | Status: AC
  Administered 2022-12-23: 2.5 mg via INTRAVENOUS
  Filled 2022-12-23: qty 2

## 2022-12-23 MED ORDER — KETOROLAC TROMETHAMINE 15 MG/ML IJ SOLN
15.0000 mg | Freq: Once | INTRAMUSCULAR | Status: AC
  Administered 2022-12-23: 15 mg via INTRAVENOUS
  Filled 2022-12-23: qty 1

## 2022-12-23 NOTE — ED Triage Notes (Addendum)
Pt states that she has had a migraine since yesterday. C/o pain in the right side of her head. Took tylenol for pain without relief. Vomiting and diarrhea yesterday. Blurry vision. Lower right back pain, reports hx of DJD and sciatica. Pt says that she felt like she had a toothache yesterday and feels like the right side of her face is swollen. Reports she feels like her speech is somewhat slurring since yesterday. 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

## 2022-12-23 NOTE — ED Provider Notes (Signed)
[place] EMERGENCY DEPARTMENT AT [facility] Provider Note   CSN: [id number] Arrival date & time: 12/23/22  0220     History  Chief Complaint  Patient presents with   Headache    Stephanie [name] is a 57 y.o. female.  The history is provided by the patient.  Headache Stephanie [name] is a 57 y.o. female who presents to the Emergency Department complaining of migraine.  She presents to the emergency department complaining of a headache that she describes as her migraine that started on Saturday.  She has a pounding in the right side of her head.  She states this is typical for her migraines.  With her migraine she normally gets blurry vision and she does have this now.  She also complains of low back pain that is bilateral that started yesterday and is described as a stabbing pain.  Her pain radiates down her right leg.  No reported injuries.  She has a history of prior similar back pain, last flareup was 1 month ago of this type of pain. No fevers.  She vomited yesterday-no vomiting today.  She also had 2 episodes of diarrhea yesterday that were nonbloody.  She also feels like her face might feel little bit swollen but she is not fully sure if it is swollen.  She is edentulous and wears dentures.  She also states that she slurs her speech a little bit but this is an ongoing problem.  She does have a history of CVA, diabetes, hypertension, migraines.   Hx/o CVA, DM, HTN.      Home Medications Prior to Admission medications   Medication Sig Start Date End Date Taking? Authorizing Provider  albuterol (VENTOLIN HFA) 108 (90 Base) MCG/ACT inhaler Inhale 2 puffs into the lungs every 6 (six) hours as needed for wheezing or shortness of breath. 02/13/22   Caro Laroche, DO  ARIPiprazole (ABILIFY) 15 MG tablet Take 15 mg by mouth every morning. 06/10/21   [provider]  blood glucose meter kit and supplies Dispense based on patient and insurance preference. Use up to  four times daily as directed. (FOR ICD-10 E10.9, E11.9). 10/22/22   Margarita Mail, DO  CAPLYTA 42 MG capsule Take 42 mg by mouth every evening. 06/08/21   [provider]  clopidogrel (PLAVIX) 75 MG tablet Take 75 mg by mouth daily. 02/25/22   [provider]  Continuous Blood Gluc Receiver (FREESTYLE LIBRE 2 READER) DEVI USE TO CHECK SUGARS DAILY 10/28/22   Orson Eva, NP  Continuous Blood Gluc Sensor (FREESTYLE LIBRE 2 SENSOR) MISC 1 each by Does not apply route every 14 (fourteen) days. 12/13/22   Margarita Mail, DO  diazepam (VALIUM) 10 MG tablet Take 10 mg by mouth 3 (three) times daily. 03/27/22   [provider]  fluticasone-salmeterol (ADVAIR) 100-50 MCG/ACT AEPB Inhale 1 puff into the lungs 2 (two) times daily. 08/05/22   Margarita Mail, DO  furosemide (LASIX) 40 MG tablet Take 1 tablet (40 mg total) by mouth 2 (two) times daily as needed for edema (take 40 mg in the morning, can take additional 40 mg in afternoon if needed for edema). 12/13/22   Margarita Mail, DO  lamoTRIgine (LAMICTAL) 100 MG tablet Take 100 mg by mouth 2 (two) times daily.    [provider]  metFORMIN (GLUCOPHAGE-XR) 500 MG 24 hr tablet Take 1 tablet (500 mg total) by mouth 2 (two) times daily with a meal. 12/13/22   Margarita Mail, DO  omeprazole (PRILOSEC) 40 MG capsule Take 1 capsule (40 mg total) by mouth daily. 10/22/22   Margarita Mail, DO  potassium chloride SA (KLOR-CON M20) 20 MEQ tablet Take 1 tablet (20 mEq total) by mouth 2 (two) times daily. 12/13/22   Margarita Mail, DO  prazosin (MINIPRESS) 2 MG capsule Take 4 mg by mouth every evening. 02/15/21   [provider]  traZODone (DESYREL) 150 MG tablet Take by mouth at bedtime.    [provider]  Vilazodone HCl (VIIBRYD) 40 MG TABS Take 40 mg by mouth daily.    [provider]      Allergies    Gabapentin, Ibuprofen, Sulfa antibiotics, Tylenol [acetaminophen], Aspirin,  Buprenorphine hcl, Carbamazepine, Codeine, Compazine, Elemental sulfur, Ioxaglate, Ivp dye [iodinated contrast media], Metrizamide, Naproxen, Norco [hydrocodone-acetaminophen], Other, Penicillin g, Prochlorperazine maleate, Reglan [metoclopramide], Toradol [ketorolac tromethamine], Tramadol, and Zofran [ondansetron hcl]    Review of Systems   Review of Systems  Neurological:  Positive for headaches.    Physical Exam Updated Vital Signs BP (!) 150/90   Pulse 80   Temp 98.7 F (37.1 C) (Oral)   Resp 16   SpO2 97%  Physical Exam Vitals and nursing note reviewed.  Constitutional:      Appearance: She is well-developed.  HENT:     Head: Normocephalic and atraumatic.  Cardiovascular:     Rate and Rhythm: Normal rate and regular rhythm.     Heart sounds: No murmur heard. Pulmonary:     Effort: Pulmonary effort is normal. No respiratory distress.     Breath sounds: Normal breath sounds.  Abdominal:     Palpations: Abdomen is soft.     Tenderness: There is no abdominal tenderness. There is no guarding or rebound.  Musculoskeletal:        General: No tenderness.     Comments: Pitting edema to BLE  Skin:    General: Skin is warm and dry.  Neurological:     Mental Status: She is alert and oriented to person, place, and time.     Comments: EOMI, visual fields grossly intact. Limited effort on facial strength testing, mild flattening of left nasolabial fold and tilt of head to left - patient states this is chronic. Global weakness in upper and lower extremities.  Tremor in all four extremities on intention, greatest in LUE/LLE.   Psychiatric:        Behavior: Behavior normal.     ED Results / Procedures / Treatments   Labs (all labs ordered are listed, but only abnormal results are displayed) Labs Reviewed  COMPREHENSIVE METABOLIC PANEL - Abnormal; Notable for the following components:      Result Value   Glucose, Bld 166 (*)    Calcium 8.8 (*)    All other components within  normal limits  CBC WITH DIFFERENTIAL/PLATELET  URINALYSIS, ROUTINE W REFLEX MICROSCOPIC    EKG EKG Interpretation  Date/Time:  Monday December 23 2022 03:09:14 EDT Ventricular Rate:  81 PR Interval:    QRS Duration: 73 QT Interval:  365 QTC Calculation: 424 R Axis:   -25 Text Interpretation: Atrial flutter with predominant 4:1 AV block Borderline left axis deviation Anterior infarct, old Confirmed by Tilden Fossa [phone]) on 12/23/2022 6:57:38 AM  Radiology CT Head Wo Contrast  Result Date: 12/23/2022 CLINICAL DATA:  Headache EXAM: CT HEAD WITHOUT CONTRAST TECHNIQUE: Contiguous axial images were obtained from the base of the skull through the vertex without intravenous contrast. RADIATION DOSE REDUCTION: This exam was performed according to  the departmental dose-optimization program which includes automated exposure control, adjustment of the mA and/or kV according to patient size and/or use of iterative reconstruction technique. COMPARISON:  12/02/2022 FINDINGS: Brain: No evidence of acute infarction, hemorrhage, hydrocephalus, extra-axial collection or mass lesion/mass effect. 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 Mild subcortical white matter and periventricular small vessel ischemic changes. Vascular: Mild intracranial atherosclerosis. Skull: Normal. Negative for fracture or focal lesion. Sinuses/Orbits: The visualized paranasal sinuses are essentially clear. The mastoid air cells are unopacified. Other: None. IMPRESSION: No acute intracranial abnormality. 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 Mild small vessel ischemic changes. Electronically Signed   By: Charline Bills M.D.   On: 12/23/2022 03:43    Procedures Procedures    Medications Ordered in ED Medications  droperidol (INAPSINE) 2.5 MG/ML injection 2.5 mg (2.5 mg Intravenous Given 12/23/22 0321)  morphine (PF) 4 MG/ML injection 4 mg (4 mg Intravenous Given 12/23/22 0532)  ketorolac (TORADOL) 15 MG/ML injection 15 mg (15 mg  Intravenous Given 12/23/22 0531)  diphenhydrAMINE (BENADRYL) injection 12.5 mg (12.5 mg Intravenous Given 12/23/22 0530)    ED Course/ Medical Decision Making/ A&P                             Medical Decision Making Amount and/or Complexity of Data Reviewed Labs: ordered. Radiology: ordered.  Risk Prescription drug management.   Patient with history of prior CVA, diabetes, hypertension, recurrent headaches here for evaluation of headache, back pain.  She states both her headache and her backline are similar to prior episodes.  On record review she has been evaluated multiple times for similar symptoms, particularly recently at [facility] when she had MRIs performed, which were negative for acute abnormality.  There is no objective facial swelling on examination.  She was treated with medications in the emergency department with resolution of her symptoms.  Neuroevaluation appears to be at her baseline.  CT head with chronic findings, CMP with hyperglycemia-no additional significant abnormalities.  CBC unremarkable.  Given patient's symptoms are resolved feel she is stable for discharge home with outpatient follow-up and return precautions.  Current clinical picture is not consistent with acute CVA, subarachnoid hemorrhage, dural sinus thrombosis, temporal arteritis, dissection, acute cord compression.        Final Clinical Impression(s) / ED Diagnoses Final diagnoses:  Bad headache    Rx / DC Orders ED Discharge Orders     None         Tilden Fossa, MD 12/23/22 0710

## 2023-01-08 DIAGNOSIS — Z419 Encounter for procedure for purposes other than remedying health state, unspecified: Secondary | ICD-10-CM | POA: Diagnosis not present

## 2023-01-09 ENCOUNTER — Emergency Department (HOSPITAL_COMMUNITY)
Admission: EM | Admit: 2023-01-09 | Discharge: 2023-01-09 | Disposition: A | Discharge status: Home / Self Care | Payer: Medicaid Other | Attending: Emergency Medicine | Admitting: Emergency Medicine

## 2023-01-09 ENCOUNTER — Encounter (HOSPITAL_COMMUNITY): Payer: Self-pay

## 2023-01-09 ENCOUNTER — Emergency Department (HOSPITAL_COMMUNITY): Payer: Medicaid Other

## 2023-01-09 DIAGNOSIS — R109 Unspecified abdominal pain: Secondary | ICD-10-CM

## 2023-01-09 DIAGNOSIS — K5790 Diverticulosis of intestine, part unspecified, without perforation or abscess without bleeding: Secondary | ICD-10-CM

## 2023-01-09 DIAGNOSIS — N390 Urinary tract infection, site not specified: Secondary | ICD-10-CM

## 2023-01-09 DIAGNOSIS — E119 Type 2 diabetes mellitus without complications: Secondary | ICD-10-CM | POA: Diagnosis not present

## 2023-01-09 DIAGNOSIS — K5732 Diverticulitis of large intestine without perforation or abscess without bleeding: Secondary | ICD-10-CM | POA: Diagnosis not present

## 2023-01-09 DIAGNOSIS — K760 Fatty (change of) liver, not elsewhere classified: Secondary | ICD-10-CM

## 2023-01-09 DIAGNOSIS — Z7984 Long term (current) use of oral hypoglycemic drugs: Secondary | ICD-10-CM | POA: Insufficient documentation

## 2023-01-09 DIAGNOSIS — Z8673 Personal history of transient ischemic attack (TIA), and cerebral infarction without residual deficits: Secondary | ICD-10-CM | POA: Diagnosis not present

## 2023-01-09 DIAGNOSIS — F17210 Nicotine dependence, cigarettes, uncomplicated: Secondary | ICD-10-CM | POA: Insufficient documentation

## 2023-01-09 DIAGNOSIS — Z7902 Long term (current) use of antithrombotics/antiplatelets: Secondary | ICD-10-CM | POA: Diagnosis not present

## 2023-01-09 DIAGNOSIS — I10 Essential (primary) hypertension: Secondary | ICD-10-CM | POA: Diagnosis not present

## 2023-01-09 DIAGNOSIS — N281 Cyst of kidney, acquired: Secondary | ICD-10-CM | POA: Diagnosis not present

## 2023-01-09 DIAGNOSIS — Z79899 Other long term (current) drug therapy: Secondary | ICD-10-CM | POA: Diagnosis not present

## 2023-01-09 DIAGNOSIS — R7989 Other specified abnormal findings of blood chemistry: Secondary | ICD-10-CM | POA: Insufficient documentation

## 2023-01-09 LAB — COMPREHENSIVE METABOLIC PANEL
ALT: 14 U/L (ref 0–44)
AST: 23 U/L (ref 15–41)
Albumin: 3.8 g/dL (ref 3.5–5.0)
Alkaline Phosphatase: 102 U/L (ref 38–126)
Anion gap: 12 (ref 5–15)
BUN: 19 mg/dL (ref 6–20)
CO2: 27 mmol/L (ref 22–32)
Calcium: 9.2 mg/dL (ref 8.9–10.3)
Chloride: 98 mmol/L (ref 98–111)
Creatinine, Ser: 1.16 mg/dL — ABNORMAL HIGH (ref 0.44–1.00)
GFR, Estimated: 55 mL/min — ABNORMAL LOW (ref >60)
Glucose, Bld: 180 mg/dL — ABNORMAL HIGH (ref 70–99)
Potassium: 4.1 mmol/L (ref 3.5–5.1)
Sodium: 137 mmol/L (ref 135–145)
Total Bilirubin: 0.9 mg/dL (ref 0.3–1.2)
Total Protein: 7.6 g/dL (ref 6.5–8.1)

## 2023-01-09 LAB — CBC WITH DIFFERENTIAL/PLATELET
Abs Immature Granulocytes: 0.01 10*3/uL (ref 0.00–0.07)
Basophils Absolute: 0 10*3/uL (ref 0.0–0.1)
Basophils Relative: 0 %
Eosinophils Absolute: 0.1 10*3/uL (ref 0.0–0.5)
Eosinophils Relative: 1 %
HCT: 41.3 % (ref 36.0–46.0)
Hemoglobin: 13.3 g/dL (ref 12.0–15.0)
Immature Granulocytes: 0 %
Lymphocytes Relative: 38 %
Lymphs Abs: 2 10*3/uL (ref 0.7–4.0)
MCH: 31.5 pg (ref 26.0–34.0)
MCHC: 32.2 g/dL (ref 30.0–36.0)
MCV: 97.9 fL (ref 80.0–100.0)
Monocytes Absolute: 0.5 10*3/uL (ref 0.1–1.0)
Monocytes Relative: 10 %
Neutro Abs: 2.7 10*3/uL (ref 1.7–7.7)
Neutrophils Relative %: 51 %
Platelets: 203 10*3/uL (ref 150–400)
RBC: 4.22 MIL/uL (ref 3.87–5.11)
RDW: 12.9 % (ref 11.5–15.5)
WBC: 5.3 10*3/uL (ref 4.0–10.5)
nRBC: 0 % (ref 0.0–0.2)

## 2023-01-09 LAB — URINALYSIS, W/ REFLEX TO CULTURE (INFECTION SUSPECTED)
Bilirubin Urine: NEGATIVE
Glucose, UA: NEGATIVE mg/dL
Ketones, ur: NEGATIVE mg/dL
Nitrite: NEGATIVE
Protein, ur: NEGATIVE mg/dL
Specific Gravity, Urine: 1.009 (ref 1.005–1.030)
WBC, UA: >50 WBC/hpf (ref 0–5)
pH: 5 (ref 5.0–8.0)

## 2023-01-09 LAB — LIPASE, BLOOD: Lipase: 35 U/L (ref 11–51)

## 2023-01-09 MED ORDER — CEPHALEXIN 500 MG PO CAPS: 500.0000 mg | ORAL_CAPSULE | Freq: Four times a day (QID) | ORAL | 0 refills | Status: DC

## 2023-01-09 MED ORDER — ONDANSETRON 4 MG PO TBDP
4.0000 mg | ORAL_TABLET | Freq: Once | ORAL | Status: AC
  Administered 2023-01-09: 4 mg via ORAL
  Filled 2023-01-09: qty 1

## 2023-01-09 MED ORDER — KETOROLAC TROMETHAMINE 15 MG/ML IJ SOLN
15.0000 mg | Freq: Once | INTRAMUSCULAR | Status: AC
  Administered 2023-01-09: 15 mg via INTRAVENOUS
  Filled 2023-01-09: qty 1

## 2023-01-09 MED ORDER — HYDROMORPHONE HCL 1 MG/ML IJ SOLN
1.0000 mg | Freq: Once | INTRAMUSCULAR | Status: AC
  Administered 2023-01-09: 1 mg via INTRAMUSCULAR
  Filled 2023-01-09: qty 1

## 2023-01-09 NOTE — ED Provider Notes (Signed)
[place] EMERGENCY DEPARTMENT AT [facility] Provider Note  CSN: [id number] Arrival date & time: 01/09/23 0865  Chief Complaint(s) Flank Pain  HPI Stephanie [name] is a 57 y.o. female with past medical history as below, significant for DM, hepatitis C, nephrolithiasis, hypertension, sciatica, OSA on CPAP who presents to the ED with complaint of right flank pain.  Patient is concerned that she is having another kidney stone.  Reports right flank pain onset approximately 4 days ago, pain radiating from right flank down to her right lower quadrant.  No vomiting.  No fevers or chills.  Increased urgency and dysuria over the past few days.  Some hematuria.  Reports Feels similar to prior kidney stone.  Follows with urology in [place].  Reports prior lithotripsy.  No medications prior to arrival.  Requesting Dilaudid for pain control  Past Medical History Past Medical History:  Diagnosis Date   Closed fracture of shaft of humerus 01/20/2017   Diabetes mellitus without complication (HCC)    Difficult intubation    Diverticulitis    Headache    Hepatitis C    treated and resolved   History of kidney stones    Hypertension    Hypotension 08/22/2018   IBS (irritable bowel syndrome)    Kidney stones    Sciatica    Sleep apnea    on cpap   Stroke (HCC) 2017   memory loss   Superficial venous thrombosis of arm, left 07/22/2016   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   Bipolar disorder (HCC) 05/09/2016    Hyperlipidemia, unspecified 05/09/2016   Irritable bowel syndrome 05/09/2016   Lumbago with sciatica 05/09/2016   Esophageal reflux 05/09/2016   Scoliosis 05/09/2016   Diverticulosis of colon 05/09/2016   DDD (degenerative disc disease), cervical 05/09/2016   ICH (intracerebral hemorrhage) (HCC)    Essential hypertension, malignant 04/19/2016   Cytotoxic brain edema (HCC) 04/19/2016   IVH (intraventricular hemorrhage) (HCC) 04/18/2016   Bilateral carpal tunnel syndrome 04/12/2016   Falls frequently 01/12/2016   Trigeminal neuralgia 06/08/2015   Sacroiliac dysfunction 03/04/2014   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 or radiculopathy 05/20/2013   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   Tobacco use disorder 09/11/2012   History of cervical cancer 07/30/2012   Hepatitis C    Home Medication(s) Prior to Admission medications   Medication Sig Start Date End Date Taking? Authorizing Provider  albuterol (VENTOLIN HFA) 108 (90 Base) MCG/ACT inhaler Inhale 2 puffs into the lungs every 6 (six) hours as needed for wheezing or shortness of breath. 02/13/22   Caro Laroche, DO  ARIPiprazole (ABILIFY) 15 MG tablet Take 15 mg by mouth every morning. 06/10/21   [provider]  blood glucose meter kit and supplies Dispense based on patient and insurance preference. Use up to four times daily as directed. (FOR ICD-10 E10.9, E11.9). 10/22/22   Margarita Mail, DO  CAPLYTA 42 MG capsule Take 42 mg by mouth every evening. 06/08/21   [provider]  clopidogrel (PLAVIX) 75 MG  tablet Take 75 mg by mouth daily. 02/25/22   [provider]  Continuous Blood Gluc Receiver (FREESTYLE LIBRE 2 READER) DEVI USE TO CHECK SUGARS DAILY 10/28/22   Orson Eva, NP  Continuous Blood Gluc Sensor (FREESTYLE LIBRE 2 SENSOR) MISC 1 each by Does not apply route every 14 (fourteen) days. 12/13/22   Margarita Mail, DO  diazepam (VALIUM) 10 MG tablet Take 10 mg by mouth 3  (three) times daily. 03/27/22   [provider]  fluticasone-salmeterol (ADVAIR) 100-50 MCG/ACT AEPB Inhale 1 puff into the lungs 2 (two) times daily. 08/05/22   Margarita Mail, DO  furosemide (LASIX) 40 MG tablet Take 1 tablet (40 mg total) by mouth 2 (two) times daily as needed for edema (take 40 mg in the morning, can take additional 40 mg in afternoon if needed for edema). 12/13/22   Margarita Mail, DO  lamoTRIgine (LAMICTAL) 100 MG tablet Take 100 mg by mouth 2 (two) times daily.    [provider]  metFORMIN (GLUCOPHAGE-XR) 500 MG 24 hr tablet Take 1 tablet (500 mg total) by mouth 2 (two) times daily with a meal. 12/13/22   Margarita Mail, DO  omeprazole (PRILOSEC) 40 MG capsule Take 1 capsule (40 mg total) by mouth daily. 10/22/22   Margarita Mail, DO  potassium chloride SA (KLOR-CON M20) 20 MEQ tablet Take 1 tablet (20 mEq total) by mouth 2 (two) times daily. 12/13/22   Margarita Mail, DO  prazosin (MINIPRESS) 2 MG capsule Take 4 mg by mouth every evening. 02/15/21   [provider]  traZODone (DESYREL) 150 MG tablet Take by mouth at bedtime.    [provider]  Vilazodone HCl (VIIBRYD) 40 MG TABS Take 40 mg by mouth daily.    [provider]                                                                                                                                    Past Surgical History Past Surgical History:  Procedure Laterality Date   ABDOMINAL HYSTERECTOMY  2007   carpel tunnel syndrome  2017   HARDWARE REMOVAL Right 12/22/2017   Procedure: HARDWARE REMOVAL-RIGHT ARM;  Surgeon: Lyndle Herrlich, MD;  Location: [facility] ORS;  Service: Orthopedics;  Laterality: Right;  Removal of implant, superficial right humerus   HUMERUS IM NAIL Right 03/11/2017   Procedure: INTRAMEDULLARY (IM) NAIL HUMERAL;  Surgeon: Lyndle Herrlich, MD;  Location: [facility] ORS;  Service: Orthopedics;  Laterality: Right;   KIDNEY STONE SURGERY Right 02/12/2012    KIDNEY STONE SURGERY Left 07/15/2012   LIGATION OF ARTERIOVENOUS  FISTULA Left 06/21/2016   Procedure: LIGATION OF ARTERIOVENOUS  FISTULA ( LIGATION BASILIC VEIN );  Surgeon: Renford Dills, MD;  Location: [facility] ORS;  Service: Vascular;  Laterality: Left;   LITHOTRIPSY     OOPHORECTOMY     SINUS EXPLORATION     TONSILLECTOMY     Family History  Family History  Problem Relation Age of Onset   Heart failure Mother    Diabetes Father    Cancer Sister     Social History Social History   Tobacco Use   Smoking status: Every Day    Packs/day: 1.00    Years: 1.50    Additional pack years: 0.00    Total pack years: 1.50    Types: Cigarettes   Smokeless tobacco: Never  Vaping Use   Vaping Use: Never used  Substance Use Topics   Alcohol use: Not Currently    Comment: no alcohol since 2002   Drug use: No   Allergies Gabapentin, Ibuprofen, Sulfa antibiotics, Tylenol [acetaminophen], Aspirin, Buprenorphine hcl, Carbamazepine, Codeine, Compazine, Elemental sulfur, Ioxaglate, Ivp dye [iodinated contrast media], Metrizamide, Naproxen, Norco [hydrocodone-acetaminophen], Other, Penicillin g, Prochlorperazine maleate, Reglan [metoclopramide], Toradol [ketorolac tromethamine], Tramadol, and Zofran [ondansetron hcl]  Review of Systems Review of Systems  Constitutional:  Negative for activity change and fever.  HENT:  Negative for facial swelling and trouble swallowing.   Eyes:  Negative for discharge and redness.  Respiratory:  Negative for cough and shortness of breath.   Cardiovascular:  Negative for chest pain and palpitations.  Gastrointestinal:  Positive for abdominal pain. Negative for nausea.  Genitourinary:  Positive for dysuria, flank pain, hematuria and urgency.  Musculoskeletal:  Negative for back pain and gait problem.  Skin:  Negative for pallor and rash.  Neurological:  Negative for syncope and headaches.    Physical Exam Vital Signs  I have reviewed the triage vital  signs BP 114/74   Pulse 82   Temp 97.9 F (36.6 C) (Oral)   Resp 18   Ht 5\' 2"  (1.575 m)   Wt 90.7 kg   SpO2 96%   BMI 36.58 kg/m  Physical Exam Vitals and nursing note reviewed.  Constitutional:      General: She is not in acute distress.    Appearance: Normal appearance.  HENT:     Head: Normocephalic and atraumatic.     Right Ear: External ear normal.     Left Ear: External ear normal.     Nose: Nose normal.     Mouth/Throat:     Mouth: Mucous membranes are moist.  Eyes:     General: No scleral icterus.       Right eye: No discharge.        Left eye: No discharge.  Cardiovascular:     Rate and Rhythm: Normal rate and regular rhythm.     Pulses: Normal pulses.     Heart sounds: Normal heart sounds.  Pulmonary:     Effort: Pulmonary effort is normal. No respiratory distress.     Breath sounds: Normal breath sounds.  Abdominal:     General: Abdomen is flat.     Palpations: Abdomen is soft.     Tenderness: There is abdominal tenderness. There is no guarding or rebound.       Comments: Not peritoneal   Musculoskeletal:     Right lower leg: No edema.     Left lower leg: No edema.  Skin:    General: Skin is warm and dry.     Capillary Refill: Capillary refill takes less than 2 seconds.  Neurological:     Mental Status: She is alert and oriented to person, place, and time.     GCS: GCS eye subscore is 4. GCS verbal subscore is 5. GCS motor subscore is 6.  Psychiatric:  Mood and Affect: Mood normal.        Behavior: Behavior normal.     ED Results and Treatments Labs (all labs ordered are listed, but only abnormal results are displayed) Labs Reviewed  COMPREHENSIVE METABOLIC PANEL - Abnormal; Notable for the following components:      Result Value   Glucose, Bld 180 (*)    Creatinine, Ser 1.16 (*)    GFR, Estimated 55 (*)    All other components within normal limits  LIPASE, BLOOD  CBC WITH DIFFERENTIAL/PLATELET  CBC WITH DIFFERENTIAL/PLATELET   URINALYSIS, W/ REFLEX TO CULTURE (INFECTION SUSPECTED)                                                                                                                          Radiology CT Renal Stone Study  Result Date: 01/09/2023 CLINICAL DATA:  Abdominal pain/flank pain. EXAM: CT ABDOMEN AND PELVIS WITHOUT CONTRAST TECHNIQUE: Multidetector CT imaging of the abdomen and pelvis was performed following the standard protocol without IV contrast. RADIATION DOSE REDUCTION: This exam was performed according to the departmental dose-optimization program which includes automated exposure control, adjustment of the mA and/or kV according to patient size and/or use of iterative reconstruction technique. COMPARISON:  11/17/2022 FINDINGS: Lower chest: No pleural effusion or airspace disease. Hepatobiliary: Hepatic steatosis. Gallbladder appears within normal limits. No bile duct dilatation. Pancreas: Unremarkable. No pancreatic ductal dilatation or surrounding inflammatory changes. Spleen: Normal in size without focal abnormality. Adrenals/Urinary Tract: Normal adrenal glands. Bilateral renal calcifications. The largest stone is in the upper pole of the left kidney measuring 5 mm. Scarring is identified involving the upper and lower pole of the right kidney with associated volume loss. Inferior pole right kidney cyst measures 1.7 cm. Cyst arising off the medial cortex of the lower pole of right kidney measures 1.2 cm. No follow-up imaging recommended. No signs of hydronephrosis bilaterally. No hydroureter or ureteral calculi. The urinary bladder is within normal limits. Stomach/Bowel: Stomach appears normal. The appendix is visualized and appears normal. No pathologic dilatation of the large or small bowel loops. Sigmoid diverticulosis without signs of acute diverticulitis. Vascular/Lymphatic: Aortic atherosclerosis. No aneurysm. No signs of abdominopelvic adenopathy. Reproductive: Status post hysterectomy. No adnexal  masses. Other: There is no free fluid or fluid collections. No signs of pneumoperitoneum. Musculoskeletal: No acute or suspicious osseous findings. L5-S1 degenerative disc disease. IMPRESSION: 1. No acute findings within the abdomen or pelvis. 2. Bilateral nonobstructing renal calculi. 3. Hepatic steatosis. 4. Sigmoid diverticulosis without signs of acute diverticulitis. 5. Aortic Atherosclerosis (ICD10-I70.0). Electronically Signed   By: Signa Kell M.D.   On: 01/09/2023 06:31    Pertinent labs & imaging results that were available during my care of the patient were reviewed by me and considered in my medical decision making (see MDM for details).  Medications Ordered in ED Medications  HYDROmorphone (DILAUDID) injection 1 mg (1 mg Intramuscular Given 01/09/23 0342)  ondansetron (ZOFRAN-ODT) disintegrating tablet 4 mg (4 mg Oral Given  01/09/23 0342)                                                                                                                                     Procedures Procedures  (including critical care time)  Medical Decision Making / ED Course    Medical Decision Making:    Geana Walts Mellin is a 57 y.o. female with past medical history as below, significant for DM, hepatitis C, nephrolithiasis, hypertension, sciatica, OSA on CPAP who presents to the ED with complaint of right flank pain.. The complaint involves an extensive differential diagnosis and also carries with it a high risk of complications and morbidity.  Serious etiology was considered. Ddx includes but is not limited to: Differential diagnosis includes but is not exclusive to ectopic pregnancy, ovarian cyst, ovarian torsion, acute appendicitis, urinary tract infection, endometriosis, bowel obstruction, hernia, colitis, renal colic, gastroenteritis, volvulus etc.    Complete initial physical exam performed, notably the patient  was no acute distress, resting comfortably, HDS, not hypoxic, ambient air,  abdomen is not peritoneal.    Reviewed and confirmed nursing documentation for past medical history, family history, social history.  Vital signs reviewed.    Clinical Course as of 01/09/23 0742  Thu Jan 09, 2023  1610 Labs stable, Cr mildly elevated. CT without obstructing stone  [SG]    Clinical Course User Index [SG] Tanda Rockers A, DO   Labs/imaging are stable, she is pending UA. She is tolerating po. Pain improved with analgesics. HDS. Exam is stable.  Pending ua at shift change  Signed out to incoming EDP Recommend she f/u with her urologist in [place] and her PCP       Additional history obtained: -Additional history obtained from na -External records from outside source obtained and reviewed including: Chart review including previous notes, labs, imaging, consultation notes including prior ED visits, medications, prior labs and imaging.  Prior culture reports   Lab Tests: -I ordered, reviewed, and interpreted labs.   The pertinent results include:   Labs Reviewed  COMPREHENSIVE METABOLIC PANEL - Abnormal; Notable for the following components:      Result Value   Glucose, Bld 180 (*)    Creatinine, Ser 1.16 (*)    GFR, Estimated 55 (*)    All other components within normal limits  LIPASE, BLOOD  CBC WITH DIFFERENTIAL/PLATELET  CBC WITH DIFFERENTIAL/PLATELET  URINALYSIS, W/ REFLEX TO CULTURE (INFECTION SUSPECTED)    Notable for stable  EKG   EKG Interpretation  Date/Time:    Ventricular Rate:    PR Interval:    QRS Duration:   QT Interval:    QTC Calculation:   R Axis:     Text Interpretation:           Imaging Studies ordered: I ordered imaging studies including ct renal I independently visualized the following imaging with scope of interpretation limited to determining acute life threatening  conditions related to emergency care; findings noted above, significant for no acute changes, b/l non obstructing renal calculi I independently  visualized and interpreted imaging. I agree with the radiologist interpretation   Medicines ordered and prescription drug management: Meds ordered this encounter  Medications   HYDROmorphone (DILAUDID) injection 1 mg   ondansetron (ZOFRAN-ODT) disintegrating tablet 4 mg    -I have reviewed the patients home medicines and have made adjustments as needed   Consultations Obtained: na   Cardiac Monitoring: nsr  Social Determinants of Health:  Diagnosis or treatment significantly limited by social determinants of health: current smoker   Reevaluation: After the interventions noted above, I reevaluated the patient and found that they have improved  Co morbidities that complicate the patient evaluation  Past Medical History:  Diagnosis Date   Closed fracture of shaft of humerus 01/20/2017   Diabetes mellitus without complication (HCC)    Difficult intubation    Diverticulitis    Headache    Hepatitis C    treated and resolved   History of kidney stones    Hypertension    Hypotension 08/22/2018   IBS (irritable bowel syndrome)    Kidney stones    Sciatica    Sleep apnea    on cpap   Stroke (HCC) 2017   memory loss   Superficial venous thrombosis of arm, left 07/22/2016      Dispostion: Disposition decision including need for hospitalization was considered, and patient disposition pending at time of sign out.    Final Clinical Impression(s) / ED Diagnoses Final diagnoses:  Abdominal pain, unspecified abdominal location  Diverticulosis  Hepatic steatosis     This chart was dictated using voice recognition software.  Despite best efforts to proofread,  errors can occur which can change the documentation meaning.    Sloan Leiter, DO 01/09/23 [phone]

## 2023-01-09 NOTE — ED Triage Notes (Signed)
Pt arrived POV for right flank pain for the past 3 days, reports "pink" tinge urine when wiping. Reports painful urination as well. Pt reports does not feel like she is emptying her bladder. Pt was seen in March and dx with pyelonephritis. VSS, NAD noted.

## 2023-01-09 NOTE — ED Notes (Signed)
Pt reports that she has 2 kidney stones on her left and 1 on the right.

## 2023-01-09 NOTE — Discharge Instructions (Addendum)
Please follow up with your urologist  It was a pleasure caring for you today in the emergency department.  Please return to the emergency department for any worsening or worrisome symptoms.

## 2023-01-09 NOTE — ED Provider Notes (Signed)
  Physical Exam  BP 113/66   Pulse 85   Temp 97.7 F (36.5 C) (Oral)   Resp 18   Ht 5\' 2"  (1.575 m)   Wt 90.7 kg   SpO2 91%   BMI 36.58 kg/m   Physical Exam  Procedures  Procedures  ED Course / MDM   Clinical Course as of 01/09/23 0920  Thu Jan 09, 2023  0643 Labs stable, Cr mildly elevated. CT without obstructing stone  [SG]    Clinical Course User Index [SG] Sloan Leiter, DO   Medical Decision Making Amount and/or Complexity of Data Reviewed Labs: ordered. Radiology: ordered.  Risk Prescription drug management.     Received patient in signout.  Flank pain.  Previous UTI.  Lab work reassuring.  White count not elevated.  CT scan of the done did not show stones.  Urine shows potential infection.  Will treat since having dysuria.  Do not think severe infection at this time.  Patient requesting more pain medicine.  No narcotics due to chronic pain and no obstructing stone.  Patient does states she can do Toradol   And does not have a real allergy to it.  Will discharge home.  Culture sent. Urology follow up       Benjiman Core, MD 01/09/23 [phone]

## 2023-01-10 ENCOUNTER — Telehealth: Payer: Self-pay | Admitting: *Deleted

## 2023-01-10 LAB — URINE CULTURE: Culture: <10000 — AB

## 2023-01-10 NOTE — Transitions of Care (Post Inpatient/ED Visit) (Signed)
   01/10/2023  Name: Stephanie [name] MRN: [id number] DOB: 1965/12/16  Today's TOC FU Call Status: Today's TOC FU Call Status:: Unsuccessul Call (1st Attempt) Unsuccessful Call (1st Attempt) Date: 01/10/23  Attempted to reach the patient regarding the most recent Inpatient/ED visit.  Follow Up Plan: Additional outreach attempts will be made to reach the patient to complete the Transitions of Care (Post Inpatient/ED visit) call.   Estanislado Emms RN, BSN [place]  Managed [facility] RN Care Coordinator [phone]

## 2023-01-13 ENCOUNTER — Other Ambulatory Visit: Payer: Medicaid Other | Admitting: *Deleted

## 2023-01-13 ENCOUNTER — Other Ambulatory Visit: Payer: Self-pay | Admitting: Internal Medicine

## 2023-01-13 DIAGNOSIS — E1165 Type 2 diabetes mellitus with hyperglycemia: Secondary | ICD-10-CM

## 2023-01-13 NOTE — Patient Instructions (Signed)
Visit Information  Ms. Selyna B Salvaggio  - as a part of your Medicaid benefit, you are eligible for care management and care coordination services at no cost or copay. I was unable to reach you by phone today but would be happy to help you with your health related needs. Please feel free to call me @ [phone].   A member of the Managed Medicaid care management team will reach out to you again over the next 7 days.   Rylei Codispoti RN, BSN [place]  Managed Medicaid RN Care Coordinator [phone]   

## 2023-01-13 NOTE — Transitions of Care (Post Inpatient/ED Visit) (Signed)
   01/13/2023  Name: MARGRETTA GOUCHER MRN: [id number] DOB: [birth year]  Today's TOC FU Call Status: Today's TOC FU Call Status:: Unsuccessful Call (2nd Attempt) Unsuccessful Call (2nd Attempt) Date: 01/13/23  Attempted to reach the patient regarding the most recent Inpatient/ED visit.  Follow Up Plan: Additional outreach attempts will be made to reach the patient to complete the Transitions of Care (Post Inpatient/ED visit) call.   Estanislado Emms RN, BSN [place]  Managed [facility] RN Care Coordinator [phone]

## 2023-01-13 NOTE — Patient Outreach (Signed)
  Medicaid Managed Care   Unsuccessful Attempt Note   01/13/2023 Name: Stephanie [name] MRN: [id number] DOB: 01/05/1966  Referred by: Margarita Mail, DO Reason for referral : Transitions Of Care (Unsuccessful [facility] outreach)   An unsuccessful telephone outreach was attempted today. The patient was referred to the case management team for assistance with care management and care coordination.    Follow Up Plan: A HIPAA compliant phone message was left for the patient providing contact information and requesting a return call. and The Managed Medicaid care management team will reach out to the patient again over the next 7 days.    Estanislado Emms RN, BSN [place]  Managed [facility] RN Care Coordinator [phone]

## 2023-01-14 NOTE — Telephone Encounter (Signed)
Unable to refill per protocol, Rx expired. Discontinued 12/13/22, patient hasn't taking in over 30 days.  Requested Prescriptions  Pending Prescriptions Disp Refills   pioglitazone (ACTOS) 15 MG tablet [Pharmacy Med Name: Pioglitazone HCl 15 MG Oral Tablet] 90 tablet 0    Sig: Take 1 tablet by mouth once daily     Endocrinology:  Diabetes - Glitazones - pioglitazone Passed - 01/13/2023  8:16 AM      Passed - HBA1C is between 0 and 7.9 and within 180 days    Hemoglobin A1C  Date Value Ref Range Status  12/13/2022 6.8 (A) 4.0 - 5.6 % Final   Hgb A1c MFr Bld  Date Value Ref Range Status  02/13/2022 6.0 (H) <5.7 % of total Hgb Final    Comment:    For someone without known diabetes, a hemoglobin  A1c value between 5.7% and 6.4% is consistent with prediabetes and should be confirmed with a  follow-up test. . For someone with known diabetes, a value <7% indicates that their diabetes is well controlled. A1c targets should be individualized based on duration of diabetes, age, comorbid conditions, and other considerations. . This assay result is consistent with an increased risk of diabetes. . Currently, no consensus exists regarding use of hemoglobin A1c for diagnosis of diabetes for children. Verna Czech - Valid encounter within last 6 months    Recent Outpatient Visits           1 month ago Type 2 diabetes mellitus with hyperglycemia, without long-term current use of insulin [facility])   [place] [facility] Margarita Mail, DO   3 months ago Increased urinary frequency   [facility] Margarita Mail, DO   4 months ago Type 2 diabetes mellitus with hyperglycemia, without long-term current use of insulin [facility])   [place] [facility] Margarita Mail, DO   5 months ago Essential hypertension, malignant   [place] [facility] Margarita Mail, DO   6 months ago Type 2 diabetes mellitus with  hyperglycemia, without long-term current use of insulin [facility])   [facility] Berniece Salines, FNP       Future Appointments             Tomorrow Margarita Mail, DO  [place] [facility], [facility]

## 2023-01-14 NOTE — Progress Notes (Unsigned)
Established Patient Office Visit  Subjective   [name]: Stephanie [name], female    DOB: Jul 16, 1966  Age: 57 y.o. MRN: [id number]  No chief complaint on file.   HPI Patient is here for follow up.    Diabetes, Type 2: -Last A1c 1/24 7.2% -Medications: Metformin 500 mg BID (had been on Glipizide in the past). Had been prescribed Actos but not currently taking -Patient is compliant with the above medications and reports no side effects. Diarrhea stopped since switching to Metformin ER -Checking BG at home: average 120-150 -Diet: ate a baked potato for dinner, cheese croissant for breakfast  -Eye exam: UTD 8/23 -Foot exam: UTD 9/23 -Microalbumin: UTD 6/23 -Statin: yes -PNA vaccine: UTD -Denies symptoms of hypoglycemia, polyuria, polydipsia, numbness extremities, foot ulcers/trauma.   HLD/Hx of CVA: -CVA August 2017, follows with Neurology last seen 08/07/22 -Medications: Crestor 20 mg, Plavix 75 mg  -Patient is compliant with above medications and reports no side effects.  -Last lipid panel: Lipid Panel     Component Value Date/Time   CHOL 123 02/13/2022 1211   TRIG 69 02/13/2022 1211   HDL 73 02/13/2022 1211   CHOLHDL 1.7 02/13/2022 1211   VLDL 18 04/22/2016 0357   LDLCALC 35 02/13/2022 1211   COPD: -COPD status: stable -Current medications: Advair BID switched at LOV due to insurance coverage, Albuterol PRN -Satisfied with current treatment?: yes - doesn't ike the Advair as much as the [employer] but overall doing well  -Oxygen use: no -Dyspnea frequency: occasional  -Cough frequency: no -Rescue inhaler frequency:  more recently due to cold -Limitation of activity: yes -Productive cough: no -Pneumovax: Up to Date -Influenza: Up to Date -Former smoker  Bilateral Nephrolithiasis: -Chronic, following with Urology.  GERD: -Currently on Prilosec 40 mg daily, doing well   Depression: -Follows with Psychiatry at [facility]  -Currently on Abilify 15 mg,  Caplyta 42 mg, Vviibryd 40 mg and Trazodone 50 mg, Valium PRN  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   Bipolar disorder (HCC) 05/09/2016   Hyperlipidemia, unspecified 05/09/2016   Irritable bowel syndrome 05/09/2016   Lumbago with sciatica 05/09/2016   Esophageal reflux 05/09/2016   Scoliosis 05/09/2016   Diverticulosis of colon 05/09/2016   DDD (degenerative disc disease), cervical 05/09/2016   ICH (intracerebral hemorrhage) (HCC)    Essential hypertension, malignant 04/19/2016   Cytotoxic brain edema (HCC) 04/19/2016   IVH (intraventricular hemorrhage) (HCC) 04/18/2016   Bilateral carpal tunnel syndrome 04/12/2016   Falls frequently 01/12/2016   Trigeminal neuralgia 06/08/2015   Sacroiliac dysfunction 03/04/2014   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 or radiculopathy 05/20/2013   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   Tobacco use disorder 09/11/2012   History of cervical cancer 07/30/2012   Hepatitis C    Past Medical History:  Diagnosis Date   Closed fracture of shaft of humerus 01/20/2017   Diabetes mellitus without complication (HCC)    Difficult intubation    Diverticulitis    Headache    Hepatitis C    treated and  resolved   History of kidney stones    Hypertension    Hypotension 08/22/2018   IBS (irritable bowel syndrome)    Kidney stones    Sciatica    Sleep apnea    on cpap   Stroke [facility]) 2017   memory loss   Superficial venous thrombosis of arm, left 07/22/2016    Past Surgical History:  Procedure Laterality Date   ABDOMINAL HYSTERECTOMY  2007   carpel tunnel syndrome  2017   HARDWARE REMOVAL Right 12/22/2017   Procedure: HARDWARE REMOVAL-RIGHT ARM;  Surgeon: Lyndle Herrlich, MD;  Location: [facility] ORS;  Service: Orthopedics;  Laterality: Right;  Removal of implant, superficial right humerus   HUMERUS IM NAIL Right 03/11/2017   Procedure: INTRAMEDULLARY (IM) NAIL HUMERAL;  Surgeon: Lyndle Herrlich, MD;  Location: [facility] ORS;  Service: Orthopedics;  Laterality: Right;   KIDNEY STONE SURGERY Right 02/12/2012   KIDNEY STONE SURGERY Left 07/15/2012   LIGATION OF ARTERIOVENOUS  FISTULA Left 06/21/2016   Procedure: LIGATION OF ARTERIOVENOUS  FISTULA ( LIGATION BASILIC VEIN );  Surgeon: Renford Dills, MD;  Location: [facility] ORS;  Service: Vascular;  Laterality: Left;   LITHOTRIPSY     OOPHORECTOMY     SINUS EXPLORATION     TONSILLECTOMY     Social History   Tobacco Use   Smoking status: Every Day    Packs/day: 1.00    Years: 1.50    Additional pack years: 0.00    Total pack years: 1.50    Types: Cigarettes   Smokeless tobacco: Never  Vaping Use   Vaping Use: Never used  Substance Use Topics   Alcohol use: Not Currently    Comment: no alcohol since 2002   Drug use: No   Social History   Socioeconomic History   Marital status: Legally Separated    Spouse name: Not on file   Number of children: Not on file   Years of education: Not on file   Highest education level: Not on file  Occupational History   Not on file  Tobacco Use   Smoking status: Every Day    Packs/day: 1.00    Years: 1.50    Additional pack years: 0.00    Total pack years: 1.50    Types: Cigarettes   Smokeless tobacco: Never  Vaping Use   Vaping Use: Never used  Substance and Sexual Activity   Alcohol use: Not Currently    Comment: no alcohol since 2002   Drug use: No   Sexual activity: Not on file  Other Topics Concern   Not on file  Social History Narrative    Not on file   Social Determinants of Health   Financial Resource Strain: Not on file  Food Insecurity: No Food Insecurity (11/12/2022)   Hunger Vital Sign    Worried About Running Out of Food in the Last Year: Never true    Ran Out of Food in the Last Year: Never true  Transportation Needs: Unmet Transportation Needs (11/12/2022)   PRAPARE - Administrator, Civil Service (Medical): Yes    Lack of Transportation (Non-Medical): No  Physical Activity: Not on file  Stress: Not on file  Social Connections: Not on file  Intimate Partner Violence: Not on file   Family Status  Relation Name Status   Mother  Deceased   Father  Alive   Sister  Deceased   Family History  Problem Relation Age of Onset   Heart failure Mother  Diabetes Father    Cancer Sister    Allergies  Allergen Reactions   Gabapentin Itching   Ibuprofen Itching   Sulfa Antibiotics Hives   Tylenol [Acetaminophen] Itching   Aspirin Itching   Buprenorphine Hcl Itching   Carbamazepine Itching, Other (See Comments) and Rash    Makes her feel like something is crawling under her skin.   Codeine Itching and Other (See Comments)    Makes her feel like something is crawling under her skin. Can take, sometimes makes her itch   Compazine Itching and Other (See Comments)    "makes my skin crawl"   Elemental Sulfur Hives, Rash and Other (See Comments)    Skin Rashes, Hives   Ioxaglate Itching   Ivp Dye [Iodinated Contrast Media] Itching and Other (See Comments)    Makes her itch really bad. Had to get 2-3 shots of Benadryl when she was in the hospital.   Metrizamide Itching   Naproxen Itching   Norco [Hydrocodone-Acetaminophen] Itching   Other Itching and Other (See Comments)    Makes her itch really bad. Had to get 2-3 shots of Benadryl when she was in the hospital.   Penicillin G Hives, Rash and Other (See Comments)    Has patient had a PCN reaction causing immediate rash, facial/tongue/throat swelling,  SOB or lightheadedness with hypotension: Yes Has patient had a PCN reaction causing severe rash involving mucus membranes or skin necrosis: No Has patient had a PCN reaction that required hospitalization: No Has patient had a PCN reaction occurring within the last 10 years: No If all of the above answers are "NO", then may proceed with Cephalosporin use.    Prochlorperazine Maleate Other (See Comments)    Compazine makes her skin crawl - needs 2-3 shots of Benadryl to get relief.   Reglan [Metoclopramide] Itching and Other (See Comments)    Makes her skin crawl   Toradol [Ketorolac Tromethamine] Itching   Tramadol Itching   Zofran [Ondansetron Hcl] Itching    Review of Systems  Constitutional:  Negative for chills and fever.  Respiratory:  Negative for cough, shortness of breath and wheezing.   Cardiovascular:  Negative for chest pain.  Gastrointestinal:  Negative for abdominal pain, heartburn, nausea and vomiting.  Musculoskeletal:  Positive for joint pain.      Objective:     There were no vitals taken for this visit. BP Readings from Last 3 Encounters:  01/09/23 113/66  12/23/22 (!) 150/90  12/13/22 126/74   Wt Readings from Last 3 Encounters:  01/09/23 200 lb (90.7 kg)  12/13/22 209 lb 11.2 oz (95.1 kg)  11/17/22 198 lb 6.6 oz (90 kg)     Physical Exam Constitutional:      Appearance: Normal appearance.  HENT:     Head: Normocephalic and atraumatic.  Eyes:     Conjunctiva/sclera: Conjunctivae normal.  Cardiovascular:     Rate and Rhythm: Normal rate and regular rhythm.  Pulmonary:     Effort: Pulmonary effort is normal.     Breath sounds: Normal breath sounds.  Musculoskeletal:     Right lower leg: Edema present.     Left lower leg: Edema present.     Comments: Chronic BLE edema  Skin:    General: Skin is warm and dry.  Neurological:     Mental Status: She is alert. Mental status is at baseline.  Psychiatric:        Mood and Affect: Affect is flat.  Speech: Speech is delayed.        Behavior: Behavior normal.        Thought Content: Thought content normal.     No results found for any visits on 01/15/23.    Last CBC Lab Results  Component Value Date   WBC 5.3 01/09/2023   HGB 13.3 01/09/2023   HCT 41.3 01/09/2023   MCV 97.9 01/09/2023   MCH 31.5 01/09/2023   RDW 12.9 01/09/2023   PLT 203 01/09/2023   Last metabolic panel Lab Results  Component Value Date   GLUCOSE 180 (H) 01/09/2023   NA 137 01/09/2023   K 4.1 01/09/2023   CL 98 01/09/2023   CO2 27 01/09/2023   BUN 19 01/09/2023   CREATININE 1.16 (H) 01/09/2023   GFRNONAA 55 (L) 01/09/2023   CALCIUM 9.2 01/09/2023   PHOS 2.3 (L) 08/22/2018   PROT 7.6 01/09/2023   ALBUMIN 3.8 01/09/2023   BILITOT 0.9 01/09/2023   ALKPHOS 102 01/09/2023   AST 23 01/09/2023   ALT 14 01/09/2023   ANIONGAP 12 01/09/2023   Last lipids Lab Results  Component Value Date   CHOL 123 02/13/2022   HDL 73 02/13/2022   LDLCALC 35 02/13/2022   TRIG 69 02/13/2022   CHOLHDL 1.7 02/13/2022   Last hemoglobin A1c Lab Results  Component Value Date   HGBA1C 6.8 (A) 12/13/2022   Last thyroid functions Lab Results  Component Value Date   TSH 1.218 04/16/2022   Last vitamin D No results found for: "25OHVITD2", "25OHVITD3", "VD25OH" Last vitamin B12 and Folate Lab Results  Component Value Date   VITAMINB12 380 02/20/2022      The ASCVD Risk score (Arnett DK, et al., 2019) failed to calculate for the following reasons:   The patient has a prior MI or stroke diagnosis    Assessment & Plan:   1. Type 2 diabetes mellitus with hyperglycemia, without long-term current use of insulin: A1c today controlled at 6.8%. Continue Metformin 500 mg BID, refilled. Refill Free Style Libre sensors as well.   - POCT HgB A1C - Continuous Blood Gluc Sensor (FREESTYLE LIBRE 2 SENSOR) MISC; 1 each by Does not apply route every 14 (fourteen) days.  Dispense: 4 each; Refill: 4 - metFORMIN  (GLUCOPHAGE-XR) 500 MG 24 hr tablet; Take 1 tablet (500 mg total) by mouth 2 (two) times daily with a meal.  Dispense: 180 tablet; Refill: 1  2. Leg swelling: Still present, continue Lasix 40 mg BID. Follow up in 1 month to recheck labs but kidney function the end of March normal.   - furosemide (LASIX) 40 MG tablet; Take 1 tablet (40 mg total) by mouth 2 (two) times daily as needed for edema (take 40 mg in the morning, can take additional 40 mg in afternoon if needed for edema).  Dispense: 30 tablet; Refill: 1  3. Hypokalemia: Refill KCl supplements 20 meQ BID, recheck labs at follow up.   - potassium chloride SA (KLOR-CON M20) 20 MEQ tablet; Take 1 tablet (20 mEq total) by mouth 2 (two) times daily.  Dispense: 90 tablet; Refill: 1  4. Acute pain of right knee: Seeing Orthopedics for evaluation soon, will give a Toradol injection here for pain today.   - ketorolac (TORADOL) injection 60 mg  5. Encounter for screening mammogram for malignant neoplasm of breast: Mammogram ordered.   - MM 3D SCREENING MAMMOGRAM BILATERAL BREAST; Future   No follow-ups on file.    Margarita Mail, DO

## 2023-01-15 ENCOUNTER — Ambulatory Visit: Payer: Self-pay

## 2023-01-15 ENCOUNTER — Encounter: Payer: Self-pay | Admitting: Internal Medicine

## 2023-01-15 ENCOUNTER — Ambulatory Visit (INDEPENDENT_AMBULATORY_CARE_PROVIDER_SITE_OTHER): Payer: Medicaid Other | Admitting: Internal Medicine

## 2023-01-15 VITALS — BP 118/74 | HR 111 | Temp 98.5°F | Resp 18 | Ht 62.0 in | Wt 221.7 lb

## 2023-01-15 DIAGNOSIS — Z7984 Long term (current) use of oral hypoglycemic drugs: Secondary | ICD-10-CM | POA: Diagnosis not present

## 2023-01-15 DIAGNOSIS — I10 Essential (primary) hypertension: Secondary | ICD-10-CM | POA: Diagnosis not present

## 2023-01-15 DIAGNOSIS — M1990 Unspecified osteoarthritis, unspecified site: Secondary | ICD-10-CM

## 2023-01-15 DIAGNOSIS — R296 Repeated falls: Secondary | ICD-10-CM | POA: Diagnosis not present

## 2023-01-15 DIAGNOSIS — R112 Nausea with vomiting, unspecified: Secondary | ICD-10-CM | POA: Diagnosis not present

## 2023-01-15 DIAGNOSIS — Z79899 Other long term (current) drug therapy: Secondary | ICD-10-CM | POA: Diagnosis not present

## 2023-01-15 DIAGNOSIS — G43811 Other migraine, intractable, with status migrainosus: Secondary | ICD-10-CM

## 2023-01-15 DIAGNOSIS — G43719 Chronic migraine without aura, intractable, without status migrainosus: Secondary | ICD-10-CM | POA: Diagnosis not present

## 2023-01-15 DIAGNOSIS — Z8673 Personal history of transient ischemic attack (TIA), and cerebral infarction without residual deficits: Secondary | ICD-10-CM | POA: Diagnosis not present

## 2023-01-15 DIAGNOSIS — E1165 Type 2 diabetes mellitus with hyperglycemia: Secondary | ICD-10-CM

## 2023-01-15 MED ORDER — FREESTYLE LIBRE 2 SENSOR MISC: 1.0000 | 4 refills | Status: DC

## 2023-01-15 MED ORDER — IBUPROFEN 800 MG PO TABS
800.0000 mg | ORAL_TABLET | ORAL | 0 refills | Status: DC | PRN
Start: 2023-01-15 — End: 2023-05-29

## 2023-01-15 MED ORDER — PROMETHAZINE HCL 12.5 MG PO TABS
12.5000 mg | ORAL_TABLET | Freq: Three times a day (TID) | ORAL | 0 refills | Status: DC | PRN
Start: 2023-01-15 — End: 2023-12-01

## 2023-01-15 NOTE — Telephone Encounter (Signed)
Reason for Disposition  Prescription request for new medicine (not a refill)    Seen this morning 01/15/2023 by Dr. Caralee Ates for her check up.  Forgot to mention wanting to take Rybelsus to lose wt.  Answer Assessment - Initial Assessment Questions 1. NAME of MEDICINE: "What medicine(s) are you calling about?"     Rybelsus 2. QUESTION: "What is your question?" (e.g., double dose of medicine, side effect)     I want to be prescribed this to lose  weight.    My family member is on it and she has lost 22 lbs.    I forgot to mention it during my appt. This morning. 3. PRESCRIBER: "Who prescribed the medicine?" Reason: if prescribed by specialist, call should be referred to that group.     Dr. Caralee Ates 4. SYMPTOMS: "Do you have any symptoms?" If Yes, ask: "What symptoms are you having?"  "How bad are the symptoms (e.g., mild, moderate, severe)     I want to take it to lose weight. 5. PREGNANCY:  "Is there any chance that you are pregnant?" "When was your last menstrual period?"     N/A due to age  Protocols used: Medication Question Call-A-AH  Chief Complaint: Pt wanting to take Rybelsus to lose weight.   Forgot to mention it during her OV this morning with Dr. Caralee Ates. Symptoms: Wants to lose wt. Frequency: N/A Pertinent Negatives: Patient denies N/A Disposition: [] ED /[] Urgent Care (no appt availability in office) / [] Appointment(In office/virtual)/ []  [facility] [facility]/ [] Home Care/ [] Refused Recommended Disposition /[] [place] [facility]/ [x]  Follow-up with PCP Additional Notes: Message sent to Dr. Caralee Ates.   Appt. Not made since pt just seen this morning for her chec up.   Pt. Agreeable to someone calling her back.

## 2023-01-15 NOTE — Telephone Encounter (Signed)
Patient called, left VM to return the call to the office to discuss medication with a nurse.  Summary: Pt requests a Rx for Rybelsus.   Pt requests a Rx for Rybelsus. Pt stated she forgot to request this during her appt. Pt asked that a nurse return her call. Cb# [phone]

## 2023-01-16 DIAGNOSIS — F319 Bipolar disorder, unspecified: Secondary | ICD-10-CM | POA: Diagnosis not present

## 2023-01-16 DIAGNOSIS — F410 Panic disorder [episodic paroxysmal anxiety] without agoraphobia: Secondary | ICD-10-CM | POA: Diagnosis not present

## 2023-01-16 DIAGNOSIS — F4312 Post-traumatic stress disorder, chronic: Secondary | ICD-10-CM | POA: Diagnosis not present

## 2023-01-16 DIAGNOSIS — F411 Generalized anxiety disorder: Secondary | ICD-10-CM | POA: Diagnosis not present

## 2023-01-17 NOTE — Telephone Encounter (Signed)
Left detailed vm °

## 2023-01-22 ENCOUNTER — Ambulatory Visit: Payer: Medicaid Other | Admitting: Internal Medicine

## 2023-01-23 ENCOUNTER — Other Ambulatory Visit: Payer: Medicaid Other | Admitting: *Deleted

## 2023-01-23 NOTE — Patient Instructions (Signed)
Visit Information  [name]  - as a part of your Medicaid benefit, you are eligible for care management and care coordination services at no cost or copay. I was unable to reach you by phone today but would be happy to help you with your health related needs. Please feel free to call me @ [phone].   A member of the Managed Medicaid care management team will reach out to you again over the next 7 days.   Estanislado Emms RN, BSN [place]  Managed [facility] RN Care Coordinator [phone]

## 2023-01-23 NOTE — Patient Outreach (Signed)
  Medicaid Managed Care   Unsuccessful Attempt Note   01/23/2023 Name: Stephanie [name] MRN: [id number] DOB: September 25, 1965  Referred by: Margarita Mail, DO Reason for referral : High Risk Managed Medicaid (Unsuccessful RNCM follow up telephone outreach)   A second unsuccessful telephone outreach was attempted today. The patient was referred to the case management team for assistance with care management and care coordination.    Follow Up Plan: A HIPAA compliant phone message was left for the patient providing contact information and requesting a return call. and The Managed Medicaid care management team will reach out to the patient again over the next 7 days.    Estanislado Emms RN, BSN [place]  Managed [facility] RN Care Coordinator [phone]

## 2023-01-27 DIAGNOSIS — Z7689 Persons encountering health services in other specified circumstances: Secondary | ICD-10-CM | POA: Diagnosis not present

## 2023-01-27 DIAGNOSIS — N200 Calculus of kidney: Secondary | ICD-10-CM | POA: Diagnosis not present

## 2023-01-27 DIAGNOSIS — R109 Unspecified abdominal pain: Secondary | ICD-10-CM | POA: Diagnosis not present

## 2023-02-04 DIAGNOSIS — Z7689 Persons encountering health services in other specified circumstances: Secondary | ICD-10-CM | POA: Diagnosis not present

## 2023-02-04 DIAGNOSIS — G43719 Chronic migraine without aura, intractable, without status migrainosus: Secondary | ICD-10-CM | POA: Diagnosis not present

## 2023-02-05 ENCOUNTER — Encounter (HOSPITAL_COMMUNITY): Payer: Self-pay

## 2023-02-05 ENCOUNTER — Emergency Department (HOSPITAL_COMMUNITY): Payer: Medicaid Other

## 2023-02-05 ENCOUNTER — Other Ambulatory Visit: Payer: Self-pay

## 2023-02-05 ENCOUNTER — Emergency Department (HOSPITAL_COMMUNITY)
Admission: EM | Admit: 2023-02-05 | Discharge: 2023-02-05 | Disposition: A | Discharge status: Home / Self Care | Payer: Medicaid Other | Attending: Emergency Medicine | Admitting: Emergency Medicine

## 2023-02-05 DIAGNOSIS — M25561 Pain in right knee: Secondary | ICD-10-CM

## 2023-02-05 DIAGNOSIS — Z8673 Personal history of transient ischemic attack (TIA), and cerebral infarction without residual deficits: Secondary | ICD-10-CM | POA: Insufficient documentation

## 2023-02-05 DIAGNOSIS — Z79899 Other long term (current) drug therapy: Secondary | ICD-10-CM | POA: Insufficient documentation

## 2023-02-05 DIAGNOSIS — N3001 Acute cystitis with hematuria: Secondary | ICD-10-CM | POA: Insufficient documentation

## 2023-02-05 DIAGNOSIS — I10 Essential (primary) hypertension: Secondary | ICD-10-CM | POA: Diagnosis not present

## 2023-02-05 DIAGNOSIS — Z7902 Long term (current) use of antithrombotics/antiplatelets: Secondary | ICD-10-CM | POA: Insufficient documentation

## 2023-02-05 DIAGNOSIS — R1030 Lower abdominal pain, unspecified: Secondary | ICD-10-CM | POA: Diagnosis present

## 2023-02-05 DIAGNOSIS — R739 Hyperglycemia, unspecified: Secondary | ICD-10-CM | POA: Insufficient documentation

## 2023-02-05 LAB — URINALYSIS, ROUTINE W REFLEX MICROSCOPIC
Bilirubin Urine: NEGATIVE
Glucose, UA: 150 mg/dL — AB
Ketones, ur: NEGATIVE mg/dL
Nitrite: NEGATIVE
Protein, ur: NEGATIVE mg/dL
Specific Gravity, Urine: 1.01 (ref 1.005–1.030)
WBC, UA: >50 WBC/hpf (ref 0–5)
pH: 6 (ref 5.0–8.0)

## 2023-02-05 LAB — COMPREHENSIVE METABOLIC PANEL
ALT: 16 U/L (ref 0–44)
AST: 16 U/L (ref 15–41)
Albumin: 3.9 g/dL (ref 3.5–5.0)
Alkaline Phosphatase: 101 U/L (ref 38–126)
Anion gap: 9 (ref 5–15)
BUN: 15 mg/dL (ref 6–20)
CO2: 34 mmol/L — ABNORMAL HIGH (ref 22–32)
Calcium: 9.7 mg/dL (ref 8.9–10.3)
Chloride: 96 mmol/L — ABNORMAL LOW (ref 98–111)
Creatinine, Ser: 0.78 mg/dL (ref 0.44–1.00)
GFR, Estimated: >60 mL/min (ref >60)
Glucose, Bld: 168 mg/dL — ABNORMAL HIGH (ref 70–99)
Potassium: 3.7 mmol/L (ref 3.5–5.1)
Sodium: 139 mmol/L (ref 135–145)
Total Bilirubin: 0.5 mg/dL (ref 0.3–1.2)
Total Protein: 7.7 g/dL (ref 6.5–8.1)

## 2023-02-05 LAB — CBC
HCT: 42.3 % (ref 36.0–46.0)
Hemoglobin: 13.5 g/dL (ref 12.0–15.0)
MCH: 31 pg (ref 26.0–34.0)
MCHC: 31.9 g/dL (ref 30.0–36.0)
MCV: 97 fL (ref 80.0–100.0)
Platelets: 162 10*3/uL (ref 150–400)
RBC: 4.36 MIL/uL (ref 3.87–5.11)
RDW: 13.2 % (ref 11.5–15.5)
WBC: 4.8 10*3/uL (ref 4.0–10.5)
nRBC: 0 % (ref 0.0–0.2)

## 2023-02-05 LAB — LIPASE, BLOOD: Lipase: 33 U/L (ref 11–51)

## 2023-02-05 MED ORDER — SODIUM CHLORIDE 0.9 % IV SOLN
12.5000 mg | INTRAVENOUS | Status: AC
  Administered 2023-02-05: 12.5 mg via INTRAVENOUS
  Filled 2023-02-05: qty 12.5

## 2023-02-05 MED ORDER — SODIUM CHLORIDE 0.9 % IV SOLN
1.0000 g | Freq: Once | INTRAVENOUS | Status: AC
  Administered 2023-02-05: 1 g via INTRAVENOUS
  Filled 2023-02-05: qty 10

## 2023-02-05 MED ORDER — CEPHALEXIN 500 MG PO CAPS: 500.0000 mg | ORAL_CAPSULE | Freq: Three times a day (TID) | ORAL | 0 refills | Status: AC

## 2023-02-05 MED ORDER — ACETAMINOPHEN 500 MG PO TABS: 1000.0000 mg | ORAL_TABLET | Freq: Four times a day (QID) | ORAL | 0 refills | Status: DC | PRN

## 2023-02-05 MED ORDER — MORPHINE SULFATE (PF) 4 MG/ML IV SOLN
4.0000 mg | Freq: Once | INTRAVENOUS | Status: AC
  Administered 2023-02-05: 4 mg via INTRAVENOUS
  Filled 2023-02-05: qty 1

## 2023-02-05 MED ORDER — ACETAMINOPHEN 500 MG PO TABS
1000.0000 mg | ORAL_TABLET | Freq: Once | ORAL | Status: AC
  Administered 2023-02-05: 1000 mg via ORAL
  Filled 2023-02-05: qty 2

## 2023-02-05 MED ORDER — MORPHINE SULFATE 15 MG PO TABS
15.0000 mg | ORAL_TABLET | ORAL | Status: DC | PRN
  Administered 2023-02-05: 15 mg via ORAL
  Filled 2023-02-05: qty 1

## 2023-02-05 NOTE — ED Triage Notes (Signed)
C/o right sided abd pain with n/v x 1 week.  Right knee pain x 1 month that is relieved when sitting.

## 2023-02-05 NOTE — ED Provider Notes (Signed)
[place] EMERGENCY DEPARTMENT AT [facility] Provider Note   CSN: [id number] Arrival date & time: 02/05/23  4098     History  Chief Complaint  Patient presents with   Abdominal Pain   Knee Pain    Stephanie [name] is a 57 y.o. female.   Abdominal Pain Knee Pain  Patient is a 57 year old female with past medical history significant for IBS, sleep apnea, hypertension, hepatitis C, stroke with residual left-sided deficits, headache, nephrolithiasis.  She had abdominal hysterectomy in 2007, has had multiple kidney stones requiring intervention in the past, oophorectomy  She is present emergency room today with complaints of approximately 2 months of abdominal pain seems to be right lower abdomen.  She states last night she became nauseous and had 2 episodes of nonbloody nonbilious emesis.  She has had normal bowel movements during this period of time.  No chest pain or difficulty breathing.  She states she has also had some right knee pain for approximately 1 month that is worse when she is walking.  She denies any traumatic injury does not recall any twisting or movement related injury.  No falls, she is not on any anticoagulation.  She denies any urinary frequency urgency dysuria or hematuria.  She denies any hip pain she states she has been walking without significant difficulty but does limp because of discomfort.     Home Medications Prior to Admission medications   Medication Sig Start Date End Date Taking? Authorizing Provider  albuterol (VENTOLIN HFA) 108 (90 Base) MCG/ACT inhaler Inhale 2 puffs into the lungs every 6 (six) hours as needed for wheezing or shortness of breath. 02/13/22   Caro Laroche, DO  ARIPiprazole (ABILIFY) 15 MG tablet Take 15 mg by mouth every morning. 06/10/21   [provider]  blood glucose meter kit and supplies Dispense based on patient and insurance preference. Use up to four times daily as directed. (FOR ICD-10 E10.9,  E11.9). 10/22/22   Margarita Mail, DO  CAPLYTA 42 MG capsule Take 42 mg by mouth every evening. 06/08/21   [provider]  cephALEXin (KEFLEX) 500 MG capsule Take 1 capsule (500 mg total) by mouth 4 (four) times daily. 01/09/23   Benjiman Core, MD  clopidogrel (PLAVIX) 75 MG tablet Take 75 mg by mouth daily. 02/25/22   [provider]  Continuous Blood Gluc Receiver (FREESTYLE LIBRE 2 READER) DEVI USE TO CHECK SUGARS DAILY 10/28/22   Orson Eva, NP  Continuous Glucose Sensor (FREESTYLE LIBRE 2 SENSOR) MISC 1 each by Does not apply route every 14 (fourteen) days. 01/15/23   Margarita Mail, DO  diazepam (VALIUM) 10 MG tablet Take 10 mg by mouth 3 (three) times daily. 03/27/22   [provider]  fluticasone-salmeterol (ADVAIR) 100-50 MCG/ACT AEPB Inhale 1 puff into the lungs 2 (two) times daily. 08/05/22   Margarita Mail, DO  furosemide (LASIX) 40 MG tablet Take 1 tablet (40 mg total) by mouth 2 (two) times daily as needed for edema (take 40 mg in the morning, can take additional 40 mg in afternoon if needed for edema). 12/13/22   Margarita Mail, DO  ibuprofen (ADVIL) 800 MG tablet Take 1 tablet (800 mg total) by mouth as needed for moderate pain. 01/15/23   Margarita Mail, DO  lamoTRIgine (LAMICTAL) 100 MG tablet Take 100 mg by mouth 2 (two) times daily.    [provider]  metFORMIN (GLUCOPHAGE-XR) 500 MG 24 hr tablet Take 1 tablet (500 mg total) by mouth 2 (  two) times daily with a meal. 12/13/22   Margarita Mail, DO  omeprazole (PRILOSEC) 40 MG capsule Take 1 capsule (40 mg total) by mouth daily. 10/22/22   Margarita Mail, DO  potassium chloride SA (KLOR-CON M20) 20 MEQ tablet Take 1 tablet (20 mEq total) by mouth 2 (two) times daily. 12/13/22   Margarita Mail, DO  prazosin (MINIPRESS) 2 MG capsule Take 4 mg by mouth every evening. 02/15/21   [provider]  promethazine (PHENERGAN) 12.5 MG tablet Take 1 tablet (12.5 mg total) by  mouth every 8 (eight) hours as needed for nausea or vomiting. 01/15/23   Margarita Mail, DO  traZODone (DESYREL) 150 MG tablet Take by mouth at bedtime.    [provider]  Vilazodone HCl (VIIBRYD) 40 MG TABS Take 40 mg by mouth daily.    [provider]      Allergies    Gabapentin, Ibuprofen, Sulfa antibiotics, Tylenol [acetaminophen], Aspirin, Buprenorphine hcl, Carbamazepine, Codeine, Compazine, Elemental sulfur, Ioxaglate, Ivp dye [iodinated contrast media], Metrizamide, Naproxen, Norco [hydrocodone-acetaminophen], Other, Penicillin g, Prochlorperazine maleate, Reglan [metoclopramide], Toradol [ketorolac tromethamine], Tramadol, and Zofran [ondansetron hcl]    Review of Systems   Review of Systems  Gastrointestinal:  Positive for abdominal pain.    Physical Exam Updated Vital Signs BP (!) 138/94 (BP Location: Right Arm)   Pulse 93   Temp 97.6 F (36.4 C) (Oral)   Resp 18   Ht 5\' 2"  (1.575 m)   Wt 90 kg   SpO2 97%   BMI 36.29 kg/m  Physical Exam Vitals and nursing note reviewed.  Constitutional:      General: She is not in acute distress. HENT:     Head: Normocephalic and atraumatic.     Nose: Nose normal.  Eyes:     General: No scleral icterus. Cardiovascular:     Rate and Rhythm: Normal rate and regular rhythm.     Pulses: Normal pulses.     Heart sounds: Normal heart sounds.  Pulmonary:     Effort: Pulmonary effort is normal. No respiratory distress.     Breath sounds: No wheezing.  Abdominal:     Palpations: Abdomen is soft.     Tenderness: There is abdominal tenderness in the right lower quadrant.  Musculoskeletal:     Cervical back: Normal range of motion.     Right lower leg: No edema.     Left lower leg: No edema.  Skin:    General: Skin is warm and dry.     Capillary Refill: Capillary refill takes less than 2 seconds.  Neurological:     Mental Status: She is alert. Mental status is at baseline.  Psychiatric:        Mood and  Affect: Mood normal.        Behavior: Behavior normal.     ED Results / Procedures / Treatments   Labs (all labs ordered are listed, but only abnormal results are displayed) Labs Reviewed  LIPASE, BLOOD  COMPREHENSIVE METABOLIC PANEL  CBC  URINALYSIS, ROUTINE W REFLEX MICROSCOPIC    EKG None  Radiology No results found.  Procedures Procedures    Medications Ordered in ED Medications  promethazine (PHENERGAN) 12.5 mg in sodium chloride 0.9 % 50 mL IVPB (has no administration in time range)  morphine (PF) 4 MG/ML injection 4 mg (has no administration in time range)  acetaminophen (TYLENOL) tablet 1,000 mg (has no administration in time range)    ED Course/ Medical Decision Making/ A&P Clinical Course  as of 02/05/23 1238  Wed Feb 05, 2023  0808 Abd pain for 2 months. Was seen in ER at beginning of this month and was tx for UTI but has had consistent symptoms.  [WF]  0809 Nausea last night and vomited twice NBNB.  [WF]  1151 IMPRESSION: Overall no significant interval change. Stable bilateral stones with right-sided renal atrophy. No obstructing ureteral stones.  Moderate colonic stool with some diverticula normal appendix.   [WF]    Clinical Course User Index [WF] Gailen Shelter, [place]                             Medical Decision Making Amount and/or Complexity of Data Reviewed Labs: ordered. Radiology: ordered.  Risk OTC drugs. Prescription drug management.   This patient presents to the ED for concern of abd pain, this involves a number of treatment options, and is a complaint that carries with it a moderate risk of complications and morbidity. A differential diagnosis was considered for the patient's symptoms which is discussed below:   The causes of generalized abdominal pain include but are not limited to AAA, mesenteric ischemia, appendicitis, diverticulitis, DKA, gastritis, gastroenteritis, AMI, nephrolithiasis, pancreatitis, peritonitis, adrenal  insufficiency,lead poisoning, iron toxicity, intestinal ischemia, constipation, UTI,SBO/LBO, splenic rupture, biliary disease, IBD, IBS, PUD, or hepatitis. Ectopic pregnancy, ovarian torsion, PID.    Co morbidities: Discussed in HPI   Brief History:  Patient is a 57 year old female with past medical history significant for IBS, sleep apnea, hypertension, hepatitis C, stroke with residual left-sided deficits, headache, nephrolithiasis.  She had abdominal hysterectomy in 2007, has had multiple kidney stones requiring intervention in the past, oophorectomy  She is present emergency room today with complaints of approximately 2 months of abdominal pain seems to be right lower abdomen.  She states last night she became nauseous and had 2 episodes of nonbloody nonbilious emesis.  She has had normal bowel movements during this period of time.  No chest pain or difficulty breathing.  She states she has also had some right knee pain for approximately 1 month that is worse when she is walking.  She denies any traumatic injury does not recall any twisting or movement related injury.  No falls, she is not on any anticoagulation.  She denies any urinary frequency urgency dysuria or hematuria.  She denies any hip pain she states she has been walking without significant difficulty but does limp because of discomfort.    EMR reviewed including pt PMHx, past surgical history and past visits to ER.   See HPI for more details   Lab Tests:   I personally reviewed all laboratory work and imaging. Metabolic panel without any acute abnormality specifically kidney function within normal limits and no significant electrolyte abnormalities. CBC without leukocytosis or significant anemia. CMP with mild hyperglycemia otherwise reassuring, lipase normal, CBC without leukocytosis or anemia, urinalysis with bacteria, greater than 50 WBCs, large leukocytes, small hemoglobin concerning for UTI  Imaging  Studies:  NAD. I personally reviewed all imaging studies and no acute abnormality found. I agree with radiology interpretation.  CT renal stone study was without any ureterolithiasis.  Stable intrarenal stones present.  Patient does have colonic stool burden.   Cardiac Monitoring:  The patient was maintained on a cardiac monitor.  I personally viewed and interpreted the cardiac monitored which showed an underlying rhythm of: NSR NA   Medicines ordered:  I ordered medication including Rocephin, Phenergan, Tylenol, morphine for  pain Reevaluation of the patient after these medicines showed that the patient improved I have reviewed the patients home medicines and have made adjustments as needed   Critical Interventions:     Consults/Attending Physician      Reevaluation:  After the interventions noted above I re-evaluated patient and found that they have :improved   Social Determinants of Health:      Problem List / ED Course:  Patient seems to have some degree of chronic abdominal pain.  She indicates that she has had some urinary urgency and her urinalysis is concerning for UTI however notably she has had similar appearance of UAs in the past with urine cultures that were bland.  Given her presence of urinary symptoms and concern of UA however will treat empirically with Keflex.  Pain improved here with analgesics.  I recommended that she follow-up with urology at [facility] and also recommend primary care follow-up. Right knee pain atraumatic no ligamentous laxity on exam and x-rays without any fractures or traumatic appearing injuries.  No bony abnormalities or infarcts. Recommend conservative treatments for knee pain and follow-up outpatient.   Dispostion:  After consideration of the diagnostic results and the patients response to treatment, I feel that the patent would benefit from outpatient follow-up.   Final Clinical Impression(s) / ED Diagnoses Final diagnoses:   Acute cystitis with hematuria  Recurrent pain of right knee    Rx / DC Orders ED Discharge Orders     None         Gailen Shelter, [place] 02/05/23 1242    Rexford Maus, DO 02/05/23 1523

## 2023-02-05 NOTE — ED Notes (Signed)
Pt has tolerated po tylenol and able to keep down.

## 2023-02-05 NOTE — ED Notes (Signed)
Pt discharged home. Discharge information discussed. No s/s of distress observed during discharge. 

## 2023-02-05 NOTE — Discharge Instructions (Signed)
You are found to have a urinary tract infection today.  He did not have any kidney stones in the ureters where they cause symptoms.  As we discussed, you do have stones in the actual kidneys themselves these have been present multiple times on prior images that you had.  Recommend you follow-up with urologist as you have had numerous urinary tract infections since you already follow with 1 in [facility] I recommend you continue to follow-up with them.

## 2023-02-06 ENCOUNTER — Other Ambulatory Visit: Payer: Self-pay

## 2023-02-06 DIAGNOSIS — M7989 Other specified soft tissue disorders: Secondary | ICD-10-CM

## 2023-02-06 MED ORDER — FUROSEMIDE 40 MG PO TABS
40.0000 mg | ORAL_TABLET | Freq: Two times a day (BID) | ORAL | 1 refills | Status: DC | PRN
Start: 2023-02-06 — End: 2023-04-22

## 2023-02-06 NOTE — Progress Notes (Deleted)
Established Patient Office Visit  Subjective   [name]: Stephanie [name], female    DOB: 1966-01-10  Age: 57 y.o. MRN: [id number]  No chief complaint on file.   HPI Patient is here to discuss weight loss options.  Diabetes, Type 2: -Last A1c 4/24 6.8% -Medications: Metformin 500 mg BID (had been on Glipizide in the past). Had been prescribed Actos but not currently taking -Patient is compliant with the above medications and reports no side effects. Diarrhea stopped since switching to Metformin ER -Checking BG at home: average 120-150 -Diet: ate a baked potato for dinner, cheese croissant for breakfast  -Eye exam: UTD 8/23 -Foot exam: UTD 9/23 -Microalbumin: UTD 6/23 -Statin: yes -PNA vaccine: UTD -Denies symptoms of hypoglycemia, polyuria, polydipsia, numbness extremities, foot ulcers/trauma.   HLD/Hx of CVA: -CVA August 2017, follows with Neurology last seen 08/07/22 -Medications: Crestor 20 mg, Plavix 75 mg  -Patient is compliant with above medications and reports no side effects.  -Last lipid panel: Lipid Panel     Component Value Date/Time   CHOL 123 02/13/2022 1211   TRIG 69 02/13/2022 1211   HDL 73 02/13/2022 1211   CHOLHDL 1.7 02/13/2022 1211   VLDL 18 04/22/2016 0357   LDLCALC 35 02/13/2022 1211   COPD: -COPD status: stable -Current medications: Advair BID switched at LOV due to insurance coverage, Albuterol PRN -Satisfied with current treatment?: yes - doesn't ike the Advair as much as the [employer] but overall doing well  -Oxygen use: no -Dyspnea frequency: occasional  -Cough frequency: no -Rescue inhaler frequency:  more recently due to cold -Limitation of activity: yes -Productive cough: no -Pneumovax: Up to Date -Influenza: Up to Date -Former smoker  Bilateral Nephrolithiasis: -Chronic, following with Urology.  GERD: -Currently on Prilosec 40 mg daily, doing well   Depression: -Follows with Psychiatry at [facility]  -Currently on  Abilify 15 mg, Caplyta 42 mg, Vviibryd 40 mg and Trazodone 50 mg, Valium PRN  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   Bipolar disorder (HCC) 05/09/2016   Hyperlipidemia, unspecified 05/09/2016   Irritable bowel syndrome 05/09/2016   Lumbago with sciatica 05/09/2016   Esophageal reflux 05/09/2016   Scoliosis 05/09/2016   Diverticulosis of colon 05/09/2016   DDD (degenerative disc disease), cervical 05/09/2016   ICH (intracerebral hemorrhage) (HCC)    Essential hypertension, malignant 04/19/2016   Cytotoxic brain edema (HCC) 04/19/2016   IVH (intraventricular hemorrhage) (HCC) 04/18/2016   Bilateral carpal tunnel syndrome 04/12/2016   Falls frequently 01/12/2016   Trigeminal neuralgia 06/08/2015   Sacroiliac dysfunction 03/04/2014   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 or radiculopathy 05/20/2013   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   Tobacco use disorder 09/11/2012   History of cervical cancer 07/30/2012   Hepatitis C    Past Medical History:  Diagnosis Date   Closed fracture of shaft of humerus 01/20/2017   Diabetes mellitus without complication (HCC)    Difficult intubation    Diverticulitis    Headache    Hepatitis C    treated and  resolved   History of kidney stones    Hypertension    Hypotension 08/22/2018   IBS (irritable bowel syndrome)    Kidney stones    Sciatica    Sleep apnea    on cpap   Stroke [facility]) 2017   memory loss   Superficial venous thrombosis of arm, left  07/22/2016   Past Surgical History:  Procedure Laterality Date   ABDOMINAL HYSTERECTOMY  2007   carpel tunnel syndrome  2017   HARDWARE REMOVAL Right 12/22/2017   Procedure: HARDWARE REMOVAL-RIGHT ARM;  Surgeon: Lyndle Herrlich, MD;  Location: [facility] ORS;  Service: Orthopedics;  Laterality: Right;  Removal of implant, superficial right humerus   HUMERUS IM NAIL Right 03/11/2017   Procedure: INTRAMEDULLARY (IM) NAIL HUMERAL;  Surgeon: Lyndle Herrlich, MD;  Location: [facility] ORS;  Service: Orthopedics;  Laterality: Right;   KIDNEY STONE SURGERY Right 02/12/2012   KIDNEY STONE SURGERY Left 07/15/2012   LIGATION OF ARTERIOVENOUS  FISTULA Left 06/21/2016   Procedure: LIGATION OF ARTERIOVENOUS  FISTULA ( LIGATION BASILIC VEIN );  Surgeon: Renford Dills, MD;  Location: [facility] ORS;  Service: Vascular;  Laterality: Left;   LITHOTRIPSY     OOPHORECTOMY     SINUS EXPLORATION     TONSILLECTOMY     Social History   Tobacco Use   Smoking status: Every Day    Packs/day: 1.00    Years: 1.50    Additional pack years: 0.00    Total pack years: 1.50    Types: Cigarettes   Smokeless tobacco: Never  Vaping Use   Vaping Use: Never used  Substance Use Topics   Alcohol use: Not Currently    Comment: no alcohol since 2002   Drug use: No   Social History   Socioeconomic History   Marital status: Legally Separated    Spouse name: Not on file   Number of children: Not on file   Years of education: Not on file   Highest education level: Not on file  Occupational History   Not on file  Tobacco Use   Smoking status: Every Day    Packs/day: 1.00    Years: 1.50    Additional pack years: 0.00    Total pack years: 1.50    Types: Cigarettes   Smokeless tobacco: Never  Vaping Use   Vaping Use: Never used  Substance and Sexual Activity   Alcohol use: Not Currently    Comment: no alcohol since 2002   Drug use: No   Sexual activity: Not on file  Other Topics Concern   Not on file  Social History  Narrative   Not on file   Social Determinants of Health   Financial Resource Strain: Not on file  Food Insecurity: No Food Insecurity (11/12/2022)   Hunger Vital Sign    Worried About Running Out of Food in the Last Year: Never true    Ran Out of Food in the Last Year: Never true  Transportation Needs: Unmet Transportation Needs (11/12/2022)   PRAPARE - Administrator, Civil Service (Medical): Yes    Lack of Transportation (Non-Medical): No  Physical Activity: Not on file  Stress: Not on file  Social Connections: Not on file  Intimate Partner Violence: Not on file   Family Status  Relation Name Status   Mother  Deceased   Father  Alive   Sister  Deceased   Family History  Problem Relation Age of Onset   Heart failure Mother  Diabetes Father    Cancer Sister    Allergies  Allergen Reactions   Gabapentin Itching   Ibuprofen Itching   Sulfa Antibiotics Hives   Tylenol [Acetaminophen] Itching   Aspirin Itching   Buprenorphine Hcl Itching   Carbamazepine Itching, Other (See Comments) and Rash    Makes her feel like something is crawling under her skin.   Codeine Itching and Other (See Comments)    Makes her feel like something is crawling under her skin. Can take, sometimes makes her itch   Compazine Itching and Other (See Comments)    "makes my skin crawl"   Elemental Sulfur Hives, Rash and Other (See Comments)    Skin Rashes, Hives   Ioxaglate Itching   Ivp Dye [Iodinated Contrast Media] Itching and Other (See Comments)    Makes her itch really bad. Had to get 2-3 shots of Benadryl when she was in the hospital.   Metrizamide Itching   Naproxen Itching   Norco [Hydrocodone-Acetaminophen] Itching   Other Itching and Other (See Comments)    Makes her itch really bad. Had to get 2-3 shots of Benadryl when she was in the hospital.   Penicillin G Hives, Rash and Other (See Comments)    Has patient had a PCN reaction causing immediate rash, facial/tongue/throat  swelling, SOB or lightheadedness with hypotension: Yes Has patient had a PCN reaction causing severe rash involving mucus membranes or skin necrosis: No Has patient had a PCN reaction that required hospitalization: No Has patient had a PCN reaction occurring within the last 10 years: No If all of the above answers are "NO", then may proceed with Cephalosporin use.    Prochlorperazine Maleate Other (See Comments)    Compazine makes her skin crawl - needs 2-3 shots of Benadryl to get relief.   Reglan [Metoclopramide] Itching and Other (See Comments)    Makes her skin crawl   Toradol [Ketorolac Tromethamine] Itching   Tramadol Itching   Zofran [Ondansetron Hcl] Itching    Review of Systems  Constitutional:  Negative for chills and fever.  Respiratory:  Negative for cough, shortness of breath and wheezing.   Cardiovascular:  Negative for chest pain.  Gastrointestinal:  Positive for nausea. Negative for abdominal pain, heartburn and vomiting.  Neurological:  Positive for headaches.      Objective:     There were no vitals taken for this visit. BP Readings from Last 3 Encounters:  02/05/23 134/76  01/15/23 118/74  01/09/23 113/66   Wt Readings from Last 3 Encounters:  02/05/23 198 lb 6.6 oz (90 kg)  01/15/23 221 lb 11.2 oz (100.6 kg)  01/09/23 200 lb (90.7 kg)     Physical Exam Constitutional:      Appearance: Normal appearance.  HENT:     Head: Normocephalic and atraumatic.  Eyes:     Conjunctiva/sclera: Conjunctivae normal.  Cardiovascular:     Rate and Rhythm: Normal rate and regular rhythm.  Pulmonary:     Effort: Pulmonary effort is normal.     Breath sounds: Normal breath sounds.  Musculoskeletal:     Comments: Chronic BLE edema  Skin:    General: Skin is warm and dry.  Neurological:     Mental Status: She is alert. Mental status is at baseline.  Psychiatric:        Mood and Affect: Affect is flat.        Speech: Speech is delayed.        Behavior: Behavior  normal.  Thought Content: Thought content normal.     No results found for any visits on 02/07/23.    Last CBC Lab Results  Component Value Date   WBC 4.8 02/05/2023   HGB 13.5 02/05/2023   HCT 42.3 02/05/2023   MCV 97.0 02/05/2023   MCH 31.0 02/05/2023   RDW 13.2 02/05/2023   PLT 162 02/05/2023   Last metabolic panel Lab Results  Component Value Date   GLUCOSE 168 (H) 02/05/2023   NA 139 02/05/2023   K 3.7 02/05/2023   CL 96 (L) 02/05/2023   CO2 34 (H) 02/05/2023   BUN 15 02/05/2023   CREATININE 0.78 02/05/2023   GFRNONAA >60 02/05/2023   CALCIUM 9.7 02/05/2023   PHOS 2.3 (L) 08/22/2018   PROT 7.7 02/05/2023   ALBUMIN 3.9 02/05/2023   BILITOT 0.5 02/05/2023   ALKPHOS 101 02/05/2023   AST 16 02/05/2023   ALT 16 02/05/2023   ANIONGAP 9 02/05/2023   Last lipids Lab Results  Component Value Date   CHOL 123 02/13/2022   HDL 73 02/13/2022   LDLCALC 35 02/13/2022   TRIG 69 02/13/2022   CHOLHDL 1.7 02/13/2022   Last hemoglobin A1c Lab Results  Component Value Date   HGBA1C 6.8 (A) 12/13/2022   Last thyroid functions Lab Results  Component Value Date   TSH 1.218 04/16/2022   Last vitamin D No results found for: "25OHVITD2", "25OHVITD3", "VD25OH" Last vitamin B12 and Folate Lab Results  Component Value Date   VITAMINB12 380 02/20/2022      The ASCVD Risk score (Arnett DK, et al., 2019) failed to calculate for the following reasons:   The patient has a prior MI or stroke diagnosis    Assessment & Plan:   1. Type 2 diabetes mellitus with hyperglycemia, without long-term current use of insulin (HCC): A1c from earlier this month improved, continue current regimen which includes Metformin 500 mg BID. Free Style Libre sensors refilled.  - Continuous Glucose Sensor (FREESTYLE LIBRE 2 SENSOR) MISC; 1 each by Does not apply route every 14 (fourteen) days.  Dispense: 4 each; Refill: 4  2. Arthritis: Refill Ibuprofen only to use as needed for pain,  will take with food.  - ibuprofen (ADVIL) 800 MG tablet; Take 1 tablet (800 mg total) by mouth as needed for moderate pain.  Dispense: 30 tablet; Refill: 0  3. Nausea and vomiting, unspecified vomiting type/Other migraine with status migrainosus, intractable: Discussed that pain medication is inappropriate for migraines and she needs to discuss migraine medication with her Neurologist. Will give phenergan to use as needed.  - promethazine (PHENERGAN) 12.5 MG tablet; Take 1 tablet (12.5 mg total) by mouth every 8 (eight) hours as needed for nausea or vomiting.  Dispense: 20 tablet; Refill: 0   No follow-ups on file.    Margarita Mail, DO

## 2023-02-07 ENCOUNTER — Ambulatory Visit: Payer: Medicaid Other | Admitting: Internal Medicine

## 2023-02-07 ENCOUNTER — Other Ambulatory Visit: Payer: Self-pay | Admitting: Internal Medicine

## 2023-02-07 ENCOUNTER — Telehealth: Payer: Self-pay | Admitting: *Deleted

## 2023-02-07 ENCOUNTER — Telehealth: Payer: Self-pay | Admitting: Internal Medicine

## 2023-02-07 DIAGNOSIS — N200 Calculus of kidney: Secondary | ICD-10-CM

## 2023-02-07 NOTE — Telephone Encounter (Signed)
Pt is calling to report that she went to the ED on 02/05/23. Reporting 4 Kidney Stones on the Right and 1 stone on the left. Pt is requesting referral to Urology. In the past patient has been to [facility]. Please advise  CB- [phone] 2017

## 2023-02-07 NOTE — Telephone Encounter (Signed)
No answer left detailed vm

## 2023-02-07 NOTE — Transitions of Care (Post Inpatient/ED Visit) (Signed)
   02/07/2023  Name: Stephanie [name] MRN: [id number] DOB: [birth year]  Today's TOC FU Call Status: Today's TOC FU Call Status:: Unsuccessul Call (1st Attempt) Unsuccessful Call (1st Attempt) Date: 02/07/23  Attempted to reach the patient regarding the most recent Inpatient/ED visit.  Follow Up Plan: Additional outreach attempts will be made to reach the patient to complete the Transitions of Care (Post Inpatient/ED visit) call.   Estanislado Emms RN, BSN [place]  [facility] RN Care Coordinator [phone]

## 2023-02-08 DIAGNOSIS — Z419 Encounter for procedure for purposes other than remedying health state, unspecified: Secondary | ICD-10-CM | POA: Diagnosis not present

## 2023-02-12 ENCOUNTER — Other Ambulatory Visit: Payer: Medicaid Other | Admitting: *Deleted

## 2023-02-12 ENCOUNTER — Encounter: Payer: Self-pay | Admitting: *Deleted

## 2023-02-12 NOTE — Patient Outreach (Signed)
Medicaid Managed Care   Nurse Care Manager Note  02/12/2023 Name:  DETA DIPASQUALE MRN:  [id number] DOB:  1966/03/06  Stephanie [name] is an 57 y.o. year old female who is a primary patient of Margarita Mail, DO.  The [facility] Managed Care Coordination team was consulted for assistance with:    Stroke DMII  Ms. Swearengen was given information about Medicaid Managed Care Coordination team services today. [name] Patient agreed to services and verbal consent obtained.  Engaged with patient by telephone for follow up visit in response to provider referral for case management and/or care coordination services.   Assessments/Interventions:  Review of past medical history, allergies, medications, health status, including review of consultants reports, laboratory and other test data, was performed as part of comprehensive evaluation and provision of chronic care management services.  SDOH (Social Determinants of Health) assessments and interventions performed: SDOH Interventions    Flowsheet Row Patient Outreach Telephone from 11/12/2022 in [place] POPULATION HEALTH DEPARTMENT Patient Outreach Telephone from 10/01/2022 in [place] POPULATION HEALTH DEPARTMENT Patient Outreach Telephone from 08/14/2022 in [place] POPULATION HEALTH DEPARTMENT Patient Outreach Telephone from 05/08/2022 in [facility] Community Care Coordination Office Visit from 03/29/2022 in [facility]  SDOH Interventions       Food Insecurity Interventions Intervention Not Indicated -- -- Intervention Not Indicated --  Housing Interventions -- Intervention Not Indicated -- Intervention Not Indicated --  Transportation Interventions Intervention Not Indicated Intervention Not Indicated -- Other (Comment)  [Provided with [employer] transportation] --  Utilities Interventions -- -- Intervention Not Indicated -- --  Depression Interventions/Treatment  -- -- -- -- Medication        Care Plan  Allergies  Allergen Reactions   Gabapentin Itching   Ibuprofen Itching   Sulfa Antibiotics Hives   Tylenol [Acetaminophen] Itching   Aspirin Itching   Buprenorphine Hcl Itching   Carbamazepine Itching, Other (See Comments) and Rash    Makes her feel like something is crawling under her skin.   Codeine Itching and Other (See Comments)    Makes her feel like something is crawling under her skin. Can take, sometimes makes her itch   Compazine Itching and Other (See Comments)    "makes my skin crawl"   Elemental Sulfur Hives, Rash and Other (See Comments)    Skin Rashes, Hives   Ioxaglate Itching   Ivp Dye [Iodinated Contrast Media] Itching and Other (See Comments)    Makes her itch really bad. Had to get 2-3 shots of Benadryl when she was in the hospital.   Metrizamide Itching   Naproxen Itching   Norco [Hydrocodone-Acetaminophen] Itching   Other Itching and Other (See Comments)    Makes her itch really bad. Had to get 2-3 shots of Benadryl when she was in the hospital.   Penicillin G Hives, Rash and Other (See Comments)    Has patient had a PCN reaction causing immediate rash, facial/tongue/throat swelling, SOB or lightheadedness with hypotension: Yes Has patient had a PCN reaction causing severe rash involving mucus membranes or skin necrosis: No Has patient had a PCN reaction that required hospitalization: No Has patient had a PCN reaction occurring within the last 10 years: No If all of the above answers are "NO", then may proceed with Cephalosporin use.    Prochlorperazine Maleate Other (See Comments)    Compazine makes her skin crawl - needs 2-3 shots of Benadryl to get relief.   Reglan [Metoclopramide] Itching and Other (  See Comments)    Makes her skin crawl   Toradol [Ketorolac Tromethamine] Itching   Tramadol Itching   Zofran [Ondansetron Hcl] Itching    Medications Reviewed Today     Reviewed by Heidi Dach, RN (Registered Nurse) on 02/12/23 at  1245  Med List Status: <None>   Medication Order Taking? Sig Documenting Provider Last Dose Status Informant  acetaminophen (TYLENOL) 500 MG tablet [id number]  Take 2 tablets (1,000 mg total) by mouth every 6 (six) hours as needed. Gailen Shelter, PA  Active   albuterol (VENTOLIN HFA) 108 (90 Base) MCG/ACT inhaler [id number] No Inhale 2 puffs into the lungs every 6 (six) hours as needed for wheezing or shortness of breath. Caro Laroche, DO Taking Active Self, Multiple Informants  ARIPiprazole (ABILIFY) 15 MG tablet [id number] No Take 15 mg by mouth every morning. [provider] Taking Active Self, Multiple Informants  blood glucose meter kit and supplies [id number] No Dispense based on patient and insurance preference. Use up to four times daily as directed. (FOR ICD-10 E10.9, E11.9). Margarita Mail, DO Taking Active   CAPLYTA 42 MG capsule [id number] No Take 42 mg by mouth every evening. [provider] Taking Active Self, Multiple Informants  cephALEXin (KEFLEX) 500 MG capsule [id number]  Take 1 capsule (500 mg total) by mouth 3 (three) times daily for 7 days. Solon Augusta S, [place]  Active   clopidogrel (PLAVIX) 75 MG tablet [id number] No Take 75 mg by mouth daily. [provider] Taking Active Self, Multiple Informants  Continuous Blood Gluc Receiver (FREESTYLE LIBRE 2 READER) DEVI [id number] No USE TO CHECK SUGARS DAILY Orson Eva, NP Taking Active   Continuous Glucose Sensor (FREESTYLE LIBRE 2 SENSOR) MISC [id number]  1 each by Does not apply route every 14 (fourteen) days. Margarita Mail, DO  Active   diazepam (VALIUM) 10 MG tablet [id number] No Take 10 mg by mouth 3 (three) times daily. [provider] Taking Active Self, Multiple Informants           Med Note Antony Madura, Harlin Rain Oct 10, 2022  6:19 PM)    fluticasone-salmeterol (ADVAIR) 100-50 MCG/ACT AEPB [id number] No Inhale 1 puff into the lungs 2 (two) times daily. Margarita Mail, DO Taking  Active   furosemide (LASIX) 40 MG tablet [id number]  Take 1 tablet (40 mg total) by mouth 2 (two) times daily as needed for edema (take 40 mg in the morning, can take additional 40 mg in afternoon if needed for edema). Margarita Mail, DO  Active   ibuprofen (ADVIL) 800 MG tablet [id number]  Take 1 tablet (800 mg total) by mouth as needed for moderate pain. Margarita Mail, DO  Active   lamoTRIgine (LAMICTAL) 100 MG tablet [id number] No Take 100 mg by mouth 2 (two) times daily. [provider] Taking Active   metFORMIN (GLUCOPHAGE-XR) 500 MG 24 hr tablet [id number] No Take 1 tablet (500 mg total) by mouth 2 (two) times daily with a meal. Margarita Mail, DO Taking Active   omeprazole (PRILOSEC) 40 MG capsule [id number] No Take 1 capsule (40 mg total) by mouth daily. Margarita Mail, DO Taking Active   potassium chloride SA (KLOR-CON M20) 20 MEQ tablet [id number] No Take 1 tablet (20 mEq total) by mouth 2 (two) times daily. Margarita Mail, DO Taking Active   prazosin (MINIPRESS) 2 MG capsule [id number] No Take 4 mg by mouth every evening. [provider] Taking Active Self, Multiple Informants  promethazine (PHENERGAN) 12.5 MG tablet [id number]  Take 1 tablet (12.5 mg total) by mouth every 8 (eight) hours as needed for nausea or vomiting. Margarita Mail, DO  Active   traZODone (DESYREL) 150 MG tablet [id number] No Take by mouth at bedtime. [provider] Taking Active   Vilazodone HCl (VIIBRYD) 40 MG TABS [id number] No Take 40 mg by mouth daily. 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   Bipolar disorder (HCC) 05/09/2016   Hyperlipidemia, unspecified 05/09/2016   Irritable bowel syndrome 05/09/2016   Lumbago with sciatica 05/09/2016   Esophageal reflux 05/09/2016   Scoliosis 05/09/2016   Diverticulosis of colon 05/09/2016   DDD (degenerative disc disease), cervical 05/09/2016   ICH (intracerebral hemorrhage) (HCC)    Essential hypertension, malignant 04/19/2016   Cytotoxic brain edema (HCC) 04/19/2016   IVH (intraventricular hemorrhage) (HCC) 04/18/2016   Bilateral carpal tunnel syndrome 04/12/2016   Falls frequently 01/12/2016   Trigeminal neuralgia 06/08/2015   Sacroiliac dysfunction 03/04/2014   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 or radiculopathy 05/20/2013   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   Tobacco use disorder 09/11/2012   History of cervical cancer 07/30/2012   Hepatitis C     Conditions to be addressed/monitored per PCP order:  DMII and Stroke  Care Plan : RN Care Manager Plan of Care  Updates made by Heidi Dach, RN since 02/12/2023 12:00 AM     Problem: Development of Plan of Care to address Health Management needs related to Stroke history      Long-Range Goal: Development of Plan of Care to address Health Management needs related to Stroke history   Start Date: 05/08/2022  Expected End Date: 04/09/2023  Priority: High  Note:   Current Barriers:  Chronic Disease Management support and education needs related to Improving Health with history of Stroke Patient receiving Vyepti infusions for headaches. No headache since first infusion one week ago. Patient requesting incontinence supplies.  RNCM Clinical Goal(s):  Patient will verbalize understanding of plan for management of  Improving Health with history of stroke as evidenced by patient reports attend all scheduled medical appointments: PCP on 02/18/23 as evidenced by provider documentation in EMR        continue to work with RN Care Manager and/or Social Worker to address care management and care coordination needs related to improving health with history of stroke as evidenced by adherence to CM Team Scheduled appointments     through collaboration with Medical illustrator, provider, and care team.   Interventions: Inter-disciplinary care team collaboration (see longitudinal plan of care) Evaluation of current treatment plan related to  self management and patient's adherence to plan as established by provider Reviewed upcoming appointments including: PCP 02/18/23   Diabetes Interventions:  (Status:  Goal Met.) Long Term Goal Assessed patient's understanding of A1c goal: <7% Reviewed medications with patient and discussed importance of medication adherence  Counseled on importance of regular laboratory monitoring as prescribed Discussed plans with patient for ongoing care management follow up and provided patient with direct contact information for care management team Reviewed scheduled/upcoming provider appointments including: 10/09/22 with PCP and Neurology and 10/23/22 with [facility] for Lung Cancer Screening Assessed social determinant of health barriers Provided encouragement as patient works to lower her A1C Advised patient continue to work on increasing exercise and improving diet  Lab Results  Component Value Date   HGBA1C 6.8 (A) 12/13/2022   Stroke:  (Status:Condition stable.  Not addressed this visit.) Long Term Goal Reviewed Importance of taking all medications as prescribed Reviewed Importance of attending all scheduled provider appointments Advised to report any changes in symptoms or exercise tolerance Assessed social determinant of health barriers Reviewed the importance of  exercise Reviewed recent Neurology note Encouraged patient to continue to exercise at home(stationary bike) Advised patient to attend Neurology appointment on 11/13/22 for plan of care-schedule Vyepti infusions Advised patient to make sure she is drinking 6-8 glasses of water a day to improve hydration  Patient Goals/Self-Care Activities: Take medications as prescribed   Attend all scheduled provider appointments Call provider office for new concerns or questions        Follow Up:  Patient agrees to Care Plan and Follow-up.  Plan: The Managed Medicaid care management team will reach out to the patient again over the next 30 days.  Date/time of next scheduled RN care management/care coordination outreach:  03/17/23 @ 12:30pm  Estanislado Emms RN, BSN [place]  Managed [facility] RN Care Coordinator [phone]

## 2023-02-12 NOTE — Patient Instructions (Signed)
Visit Information  Ms. Terzian was given information about Medicaid Managed Care team care coordination services as a part of their [facility] Medicaid benefit. DONYELL BIFFLE verbally consented to engagement with the [facility] Managed Care team.   If you are experiencing a medical emergency, please call 911 or report to your local emergency department or urgent care.   If you have a non-emergency medical problem during routine business hours, please contact your provider's office and ask to speak with a nurse.   For questions related to your [facility] health plan, please call: [phone] or go here:[email or web address]  If you would like to schedule transportation through your [facility] plan, please call the following number at least 2 days in advance of your appointment: [phone].   You can also use the MTM portal or MTM mobile app to manage your rides. Reimbursement for transportation is available through [facility]! For the portal, please go to [email or web address]  Call the [facility] Crisis Line at [phone], at any time, 24 hours a day, 7 days a week. If you are in danger or need immediate medical attention call 911.  If you would like help to quit smoking, call 1-800-QUIT-NOW (772 117 1082) OR Espaol: 1-855-Djelo-Ya (4-132-440-1027) o para ms informacin haga clic aqu or Text READY to 253-664 to register via text  Ms. Vear Clock,   Please see education materials related to DM provided by MyChart link.  Patient verbalizes understanding of instructions and care plan provided today and agrees to view in MyChart. Active MyChart status and patient understanding of how to access instructions and care plan via MyChart confirmed with patient.     Telephone follow up appointment with Managed Medicaid care management team member scheduled for:03/17/23 @ 12:30 pm  Estanislado Emms RN, BSN [place]  Managed [facility] RN Care  Coordinator [phone]   Following is a copy of your plan of care:  Care Plan : RN Care Manager Plan of Care  Updates made by Heidi Dach, RN since 02/12/2023 12:00 AM     Problem: Development of Plan of Care to address Health Management needs related to Stroke history      Long-Range Goal: Development of Plan of Care to address Health Management needs related to Stroke history   Start Date: 05/08/2022  Expected End Date: 04/09/2023  Priority: High  Note:   Current Barriers:  Chronic Disease Management support and education needs related to Improving Health with history of Stroke Patient receiving Vyepti infusions for headaches. No headache since first infusion one week ago. Patient requesting incontinence supplies.  RNCM Clinical Goal(s):  Patient will verbalize understanding of plan for management of Improving Health with history of stroke as evidenced by patient reports attend all scheduled medical appointments: PCP on 02/18/23 as evidenced by provider documentation in EMR        continue to work with RN Care Manager and/or Social Worker to address care management and care coordination needs related to improving health with history of stroke as evidenced by adherence to CM Team Scheduled appointments     through collaboration with Medical illustrator, provider, and care team.   Interventions: Inter-disciplinary care team collaboration (see longitudinal plan of care) Evaluation of current treatment plan related to  self management and patient's adherence to plan as established by provider Reviewed upcoming appointments including: PCP 02/18/23   Diabetes Interventions:  (Status:  Goal on track:  Yes.) Long Term Goal Assessed patient's understanding of A1c goal: <7% Reviewed medications with patient and discussed importance of  medication adherence Counseled on importance of regular laboratory monitoring as prescribed Discussed plans with patient for ongoing care management follow up  and provided patient with direct contact information for care management team Reviewed scheduled/upcoming provider appointments including: 10/09/22 with PCP and Neurology and 10/23/22 with [facility] for Lung Cancer Screening Assessed social determinant of health barriers Provided encouragement as patient works to lower her A1C Advised patient continue to work on increasing exercise and improving diet  Lab Results  Component Value Date   HGBA1C 6.8 (A) 12/13/2022   Stroke:  (Status:Goal on track:  Yes.) Long Term Goal Reviewed Importance of taking all medications as prescribed Reviewed Importance of attending all scheduled provider appointments Advised to report any changes in symptoms or exercise tolerance Assessed social determinant of health barriers Reviewed the importance of exercise Reviewed recent Neurology note Encouraged patient to continue to exercise at home(stationary bike) Discussed Vyepti infusions-patient without headache since first infusion on 02/04/23 Advised patient to make sure she is drinking 6-8 glasses of water a day to improve hydration Advised patient to discuss needing incontinence supplies with PCP on 02/18/23  Patient Goals/Self-Care Activities: Take medications as prescribed   Attend all scheduled provider appointments Call provider office for new concerns or questions

## 2023-02-13 DIAGNOSIS — F4312 Post-traumatic stress disorder, chronic: Secondary | ICD-10-CM | POA: Diagnosis not present

## 2023-02-13 DIAGNOSIS — F411 Generalized anxiety disorder: Secondary | ICD-10-CM | POA: Diagnosis not present

## 2023-02-13 DIAGNOSIS — F410 Panic disorder [episodic paroxysmal anxiety] without agoraphobia: Secondary | ICD-10-CM | POA: Diagnosis not present

## 2023-02-13 DIAGNOSIS — F319 Bipolar disorder, unspecified: Secondary | ICD-10-CM | POA: Diagnosis not present

## 2023-02-14 DIAGNOSIS — Z7689 Persons encountering health services in other specified circumstances: Secondary | ICD-10-CM | POA: Diagnosis not present

## 2023-02-18 ENCOUNTER — Inpatient Hospital Stay: Payer: Medicaid Other | Admitting: Internal Medicine

## 2023-02-18 NOTE — Progress Notes (Deleted)
Established Patient Office Visit  Subjective   [name]: Stephanie [name], female    DOB: [birth year]  Age: 57 y.o. MRN: [id number]  No chief complaint on file.   HPI Patient is here for hospital follow up.   Discharge Date: *** Hospital/facility: *** Diagnosis: *** Procedures/tests: *** Consultants: *** New medications: *** Discontinued medications: *** Discharge instructions:  *** Status: {Blank multiple:19196::"better","worse","stable","fluctuating"}   Diabetes, Type 2: -Last A1c 4/24 6.8% -Medications: Metformin 500 mg BID (had been on Glipizide in the past). Had been prescribed Actos but not currently taking -Patient is compliant with the above medications and reports no side effects. Diarrhea stopped since switching to Metformin ER -Checking BG at home: average 120-150 -Diet: ate a baked potato for dinner, cheese croissant for breakfast  -Eye exam: UTD 8/23 -Foot exam: UTD 9/23 -Microalbumin: UTD 6/23 -Statin: yes -PNA vaccine: UTD -Denies symptoms of hypoglycemia, polyuria, polydipsia, numbness extremities, foot ulcers/trauma.   HLD/Hx of CVA: -CVA August 2017, follows with Neurology last seen 08/07/22 -Medications: Crestor 20 mg, Plavix 75 mg  -Patient is compliant with above medications and reports no side effects.  -Last lipid panel: Lipid Panel     Component Value Date/Time   CHOL 123 02/13/2022 1211   TRIG 69 02/13/2022 1211   HDL 73 02/13/2022 1211   CHOLHDL 1.7 02/13/2022 1211   VLDL 18 04/22/2016 0357   LDLCALC 35 02/13/2022 1211   COPD: -COPD status: stable -Current medications: Advair BID switched at [facility]OV due to insurance coverage, Albuterol PRN -Satisfied with current treatment?: yes - doesn't ike the Advair as much as the [employer] but overall doing well  -Oxygen use: no -Dyspnea frequency: occasional  -Cough frequency: no -Rescue inhaler frequency:  more recently due to cold -Limitation of activity: yes -Productive cough: no -Pneumovax:  Up to Date -Influenza: Up to Date -Former smoker  Bilateral Nephrolithiasis: -Chronic, following with Urology.  GERD: -Currently on Prilosec 40 mg daily, doing well   Depression: -Follows with Psychiatry at [facility]  -Currently on Abilify 15 mg, Caplyta 42 mg, Vviibryd 40 mg and Trazodone 50 mg, Valium PRN  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   Bipolar disorder (HCC) 05/09/2016   Hyperlipidemia, unspecified 05/09/2016   Irritable bowel syndrome 05/09/2016   Lumbago with sciatica 05/09/2016   Esophageal reflux 05/09/2016   Scoliosis 05/09/2016   Diverticulosis of colon 05/09/2016   DDD (degenerative disc disease), cervical 05/09/2016   ICH (intracerebral hemorrhage) (HCC)    Essential hypertension, malignant 04/19/2016   Cytotoxic brain edema (HCC) 04/19/2016   IVH (intraventricular hemorrhage) (HCC) 04/18/2016   Bilateral carpal tunnel syndrome 04/12/2016   Falls frequently 01/12/2016   Trigeminal neuralgia 06/08/2015   Sacroiliac dysfunction 03/04/2014   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 or radiculopathy 05/20/2013   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   Tobacco use disorder 09/11/2012   History of cervical cancer 07/30/2012   Hepatitis C    Past Medical History:  Diagnosis Date   Closed fracture of shaft of humerus 01/20/2017   Diabetes mellitus  without complication (HCC)    Difficult intubation    Diverticulitis    Headache    Hepatitis C    treated and  resolved   History of kidney stones    Hypertension    Hypotension 08/22/2018   IBS (irritable bowel syndrome)    Kidney stones    Sciatica    Sleep apnea    on cpap   Stroke (HCC) 2017   memory loss   Superficial venous thrombosis of arm, left 07/22/2016   Past Surgical History:  Procedure Laterality Date   ABDOMINAL HYSTERECTOMY  2007   carpel tunnel syndrome  2017   HARDWARE REMOVAL Right 12/22/2017   Procedure: HARDWARE REMOVAL-RIGHT ARM;  Surgeon: Lyndle Herrlich, MD;  Location: [facility];  Service: Orthopedics;  Laterality: Right;  Removal of implant, superficial right humerus   HUMERUS IM NAIL Right 03/11/2017   Procedure: INTRAMEDULLARY (IM) NAIL HUMERAL;  Surgeon: Lyndle Herrlich, MD;  Location: [facility];  Service: Orthopedics;  Laterality: Right;   KIDNEY STONE SURGERY Right 02/12/2012   KIDNEY STONE SURGERY Left 07/15/2012   LIGATION OF ARTERIOVENOUS  FISTULA Left 06/21/2016   Procedure: LIGATION OF ARTERIOVENOUS  FISTULA ( LIGATION BASILIC VEIN );  Surgeon: Renford Dills, MD;  Location: [facility];  Service: Vascular;  Laterality: Left;   LITHOTRIPSY     OOPHORECTOMY     SINUS EXPLORATION     TONSILLECTOMY     Social History   Tobacco Use   Smoking status: Every Day    Packs/day: 1.00    Years: 1.50    Additional pack years: 0.00    Total pack years: 1.50    Types: Cigarettes   Smokeless tobacco: Never  Vaping Use   Vaping Use: Never used  Substance Use Topics   Alcohol use: Not Currently    Comment: no alcohol since 2002   Drug use: No   Social History   Socioeconomic History   Marital status: Legally Separated    Spouse name: Not on file   Number of children: Not on file   Years of education: Not on file   Highest education level: Not on file  Occupational History   Not on file  Tobacco Use   Smoking status: Every Day    Packs/day: 1.00    Years: 1.50    Additional pack years: 0.00    Total pack years: 1.50    Types: Cigarettes    Smokeless tobacco: Never  Vaping Use   Vaping Use: Never used  Substance and Sexual Activity   Alcohol use: Not Currently    Comment: no alcohol since 2002   Drug use: No   Sexual activity: Not on file  Other Topics Concern   Not on file  Social History Narrative   Not on file   Social Determinants of Health   Financial Resource Strain: Not on file  Food Insecurity: No Food Insecurity (11/12/2022)   Hunger Vital Sign    Worried About Running Out of Food in the Last Year: Never true    Ran Out of Food in the Last Year: Never true  Transportation Needs: Unmet Transportation Needs (11/12/2022)   PRAPARE - Administrator, Civil Service (Medical): Yes    Lack of Transportation (Non-Medical): No  Physical Activity: Not on file  Stress: Not on file  Social Connections: Not on file  Intimate Partner Violence: Not on file   Family Status  Relation Name Status   Mother  Deceased   Father  Alive   Sister  Deceased   Family History  Problem Relation Age of Onset   Heart failure Mother    Diabetes Father    Cancer Sister    Allergies  Allergen Reactions   Gabapentin Itching   Ibuprofen Itching   Sulfa Antibiotics Hives   Tylenol [Acetaminophen] Itching   Aspirin Itching   Buprenorphine Hcl Itching   Carbamazepine Itching, Other (See Comments) and Rash    Makes her feel like something is crawling under her skin.   Codeine Itching and Other (See Comments)    Makes her feel like something is crawling under her skin. Can take, sometimes makes her itch   Compazine Itching and Other (See Comments)    "makes my skin crawl"   Elemental Sulfur Hives, Rash and Other (See Comments)    Skin Rashes, Hives   Ioxaglate Itching   Ivp Dye [Iodinated Contrast Media] Itching and Other (See Comments)    Makes her itch really bad. Had to get 2-3 shots of Benadryl when she was in the hospital.   Metrizamide Itching   Naproxen Itching   Norco [Hydrocodone-Acetaminophen] Itching    Other Itching and Other (See Comments)    Makes her itch really bad. Had to get 2-3 shots of Benadryl when she was in the hospital.   Penicillin G Hives, Rash and Other (See Comments)    Has patient had a PCN reaction causing immediate rash, facial/tongue/throat swelling, SOB or lightheadedness with hypotension: Yes Has patient had a PCN reaction causing severe rash involving mucus membranes or skin necrosis: No Has patient had a PCN reaction that required hospitalization: No Has patient had a PCN reaction occurring within the last 10 years: No If all of the above answers are "NO", then may proceed with Cephalosporin use.    Prochlorperazine Maleate Other (See Comments)    Compazine makes her skin crawl - needs 2-3 shots of Benadryl to get relief.   Reglan [Metoclopramide] Itching and Other (See Comments)    Makes her skin crawl   Toradol [Ketorolac Tromethamine] Itching   Tramadol Itching   Zofran [Ondansetron Hcl] Itching    Review of Systems  Constitutional:  Negative for chills and fever.  Respiratory:  Negative for cough, shortness of breath and wheezing.   Cardiovascular:  Negative for chest pain.  Gastrointestinal:  Positive for nausea. Negative for abdominal pain, heartburn and vomiting.  Neurological:  Positive for headaches.      Objective:     There were no vitals taken for this visit. BP Readings from Last 3 Encounters:  02/05/23 134/76  01/15/23 118/74  01/09/23 113/66   Wt Readings from Last 3 Encounters:  02/05/23 198 lb 6.6 oz (90 kg)  01/15/23 221 lb 11.2 oz (100.6 kg)  01/09/23 200 lb (90.7 kg)     Physical Exam Constitutional:      Appearance: Normal appearance.  HENT:     Head: Normocephalic and atraumatic.  Eyes:     Conjunctiva/sclera: Conjunctivae normal.  Cardiovascular:     Rate and Rhythm: Normal rate and regular rhythm.  Pulmonary:     Effort: Pulmonary effort is normal.     Breath sounds: Normal breath sounds.  Musculoskeletal:      Comments: Chronic BLE edema  Skin:    General: Skin is warm and dry.  Neurological:     Mental Status: She is alert. Mental status is at baseline.  Psychiatric:  Mood and Affect: Affect is flat.        Speech: Speech is delayed.        Behavior: Behavior normal.        Thought Content: Thought content normal.     No results found for any visits on 02/18/23.    Last CBC Lab Results  Component Value Date   WBC 4.8 02/05/2023   HGB 13.5 02/05/2023   HCT 42.3 02/05/2023   MCV 97.0 02/05/2023   MCH 31.0 02/05/2023   RDW 13.2 02/05/2023   PLT 162 02/05/2023   Last metabolic panel Lab Results  Component Value Date   GLUCOSE 168 (H) 02/05/2023   NA 139 02/05/2023   K 3.7 02/05/2023   CL 96 (L) 02/05/2023   CO2 34 (H) 02/05/2023   BUN 15 02/05/2023   CREATININE 0.78 02/05/2023   GFRNONAA >60 02/05/2023   CALCIUM 9.7 02/05/2023   PHOS 2.3 (L) 08/22/2018   PROT 7.7 02/05/2023   ALBUMIN 3.9 02/05/2023   BILITOT 0.5 02/05/2023   ALKPHOS 101 02/05/2023   AST 16 02/05/2023   ALT 16 02/05/2023   ANIONGAP 9 02/05/2023   Last lipids Lab Results  Component Value Date   CHOL 123 02/13/2022   HDL 73 02/13/2022   LDLCALC 35 02/13/2022   TRIG 69 02/13/2022   CHOLHDL 1.7 02/13/2022   Last hemoglobin A1c Lab Results  Component Value Date   HGBA1C 6.8 (A) 12/13/2022   Last thyroid functions Lab Results  Component Value Date   TSH 1.218 04/16/2022   Last vitamin D No results found for: "25OHVITD2", "25OHVITD3", "VD25OH" Last vitamin B12 and Folate Lab Results  Component Value Date   VITAMINB12 380 02/20/2022      The ASCVD Risk score (Arnett DK, et al., 2019) failed to calculate for the following reasons:   The patient has a prior MI or stroke diagnosis    Assessment & Plan:   1. Type 2 diabetes mellitus with hyperglycemia, without long-term current use of insulin (HCC): A1c from earlier this month improved, continue current regimen which includes  Metformin 500 mg BID. Free Style Libre sensors refilled.  - Continuous Glucose Sensor (FREESTYLE LIBRE 2 SENSOR) MISC; 1 each by Does not apply route every 14 (fourteen) days.  Dispense: 4 each; Refill: 4  2. Arthritis: Refill Ibuprofen only to use as needed for pain, will take with food.  - ibuprofen (ADVIL) 800 MG tablet; Take 1 tablet (800 mg total) by mouth as needed for moderate pain.  Dispense: 30 tablet; Refill: 0  3. Nausea and vomiting, unspecified vomiting type/Other migraine with status migrainosus, intractable: Discussed that pain medication is inappropriate for migraines and she needs to discuss migraine medication with her Neurologist. Will give phenergan to use as needed.  - promethazine (PHENERGAN) 12.5 MG tablet; Take 1 tablet (12.5 mg total) by mouth every 8 (eight) hours as needed for nausea or vomiting.  Dispense: 20 tablet; Refill: 0   No follow-ups on file.    Margarita Mail, DO

## 2023-02-20 DIAGNOSIS — Z7689 Persons encountering health services in other specified circumstances: Secondary | ICD-10-CM | POA: Diagnosis not present

## 2023-02-22 ENCOUNTER — Emergency Department (HOSPITAL_COMMUNITY): Payer: Medicaid Other

## 2023-02-22 ENCOUNTER — Other Ambulatory Visit: Payer: Self-pay

## 2023-02-22 ENCOUNTER — Encounter (HOSPITAL_COMMUNITY): Payer: Self-pay | Admitting: *Deleted

## 2023-02-22 ENCOUNTER — Emergency Department (HOSPITAL_COMMUNITY)
Admission: EM | Admit: 2023-02-22 | Discharge: 2023-02-22 | Disposition: A | Discharge status: Home / Self Care | Payer: Medicaid Other | Attending: Emergency Medicine | Admitting: Emergency Medicine

## 2023-02-22 DIAGNOSIS — E119 Type 2 diabetes mellitus without complications: Secondary | ICD-10-CM | POA: Diagnosis not present

## 2023-02-22 DIAGNOSIS — N200 Calculus of kidney: Secondary | ICD-10-CM | POA: Diagnosis not present

## 2023-02-22 DIAGNOSIS — R7989 Other specified abnormal findings of blood chemistry: Secondary | ICD-10-CM | POA: Insufficient documentation

## 2023-02-22 DIAGNOSIS — W19XXXA Unspecified fall, initial encounter: Secondary | ICD-10-CM | POA: Insufficient documentation

## 2023-02-22 DIAGNOSIS — R9431 Abnormal electrocardiogram [ECG] [EKG]: Secondary | ICD-10-CM | POA: Diagnosis not present

## 2023-02-22 DIAGNOSIS — I10 Essential (primary) hypertension: Secondary | ICD-10-CM | POA: Diagnosis not present

## 2023-02-22 DIAGNOSIS — Z7984 Long term (current) use of oral hypoglycemic drugs: Secondary | ICD-10-CM | POA: Insufficient documentation

## 2023-02-22 DIAGNOSIS — M545 Low back pain, unspecified: Secondary | ICD-10-CM

## 2023-02-22 DIAGNOSIS — R531 Weakness: Secondary | ICD-10-CM | POA: Diagnosis not present

## 2023-02-22 DIAGNOSIS — K760 Fatty (change of) liver, not elsewhere classified: Secondary | ICD-10-CM | POA: Diagnosis not present

## 2023-02-22 DIAGNOSIS — N3000 Acute cystitis without hematuria: Secondary | ICD-10-CM | POA: Insufficient documentation

## 2023-02-22 DIAGNOSIS — M25561 Pain in right knee: Secondary | ICD-10-CM | POA: Insufficient documentation

## 2023-02-22 DIAGNOSIS — R29818 Other symptoms and signs involving the nervous system: Secondary | ICD-10-CM | POA: Diagnosis not present

## 2023-02-22 DIAGNOSIS — Z7902 Long term (current) use of antithrombotics/antiplatelets: Secondary | ICD-10-CM | POA: Insufficient documentation

## 2023-02-22 LAB — BASIC METABOLIC PANEL
Anion gap: 12 (ref 5–15)
BUN: 16 mg/dL (ref 6–20)
CO2: 29 mmol/L (ref 22–32)
Calcium: 9.5 mg/dL (ref 8.9–10.3)
Chloride: 99 mmol/L (ref 98–111)
Creatinine, Ser: 0.88 mg/dL (ref 0.44–1.00)
GFR, Estimated: >60 mL/min (ref >60)
Glucose, Bld: 155 mg/dL — ABNORMAL HIGH (ref 70–99)
Potassium: 4 mmol/L (ref 3.5–5.1)
Sodium: 140 mmol/L (ref 135–145)

## 2023-02-22 LAB — URINALYSIS, ROUTINE W REFLEX MICROSCOPIC
Bilirubin Urine: NEGATIVE
Glucose, UA: 50 mg/dL — AB
Ketones, ur: NEGATIVE mg/dL
Nitrite: NEGATIVE
Protein, ur: NEGATIVE mg/dL
Specific Gravity, Urine: 1.005 (ref 1.005–1.030)
pH: 7 (ref 5.0–8.0)

## 2023-02-22 LAB — CBC
HCT: 43.6 % (ref 36.0–46.0)
Hemoglobin: 13.7 g/dL (ref 12.0–15.0)
MCH: 30.9 pg (ref 26.0–34.0)
MCHC: 31.4 g/dL (ref 30.0–36.0)
MCV: 98.4 fL (ref 80.0–100.0)
Platelets: 173 10*3/uL (ref 150–400)
RBC: 4.43 MIL/uL (ref 3.87–5.11)
RDW: 13.6 % (ref 11.5–15.5)
WBC: 5.1 10*3/uL (ref 4.0–10.5)
nRBC: 0 % (ref 0.0–0.2)

## 2023-02-22 LAB — LACTIC ACID, PLASMA
Lactic Acid, Venous: 2.9 mmol/L (ref 0.5–1.9)
Lactic Acid, Venous: 1.3 mmol/L (ref 0.5–1.9)

## 2023-02-22 LAB — AMMONIA: Ammonia: 16 umol/L (ref 9–35)

## 2023-02-22 MED ORDER — CEFADROXIL 500 MG PO CAPS: 500.0000 mg | ORAL_CAPSULE | Freq: Two times a day (BID) | ORAL | 0 refills | Status: DC

## 2023-02-22 MED ORDER — MORPHINE SULFATE (PF) 4 MG/ML IV SOLN
4.0000 mg | Freq: Once | INTRAVENOUS | Status: AC
  Administered 2023-02-22: 4 mg via INTRAVENOUS
  Filled 2023-02-22: qty 1

## 2023-02-22 MED ORDER — MORPHINE SULFATE (PF) 2 MG/ML IV SOLN
2.0000 mg | Freq: Once | INTRAVENOUS | Status: AC
  Administered 2023-02-22: 2 mg via INTRAVENOUS
  Filled 2023-02-22: qty 1

## 2023-02-22 NOTE — ED Triage Notes (Signed)
2 weeks of weak urine stream, fell last night, hurting her butt. Pt had to have help to get up. Pt has difficultly walking since fall.

## 2023-02-22 NOTE — ED Provider Notes (Signed)
[facility] EMERGENCY DEPARTMENT AT [facility] Provider Note   CSN: [id number] Arrival date & time: 02/22/23  0935     History  Chief Complaint  Patient presents with   Fall   Back Pain    Stephanie [name] is a 57 y.o. female with past medical history significant for hepatitis, intraventricular hemorrhage, hypertension, cytotoxic brain edema, previous UTIs, encephalopathy, diabetes, bipolar disorder, she endorses previous history of seizures although no documented history of seizures on her chart review who presents with concern for 2 weeks of weak urine stream, concern for kidney stones, she endorses falling last night, hurting her lower back and buttock.  Patient denies any head injury, neck injury.  She endorses taking Plavix at this time.  Patient with previous stroke, and previously noted facial droop.  She reports that she has had difficulty walking since the fall but she does not walk very much at baseline.  She denies any fever, chills, nausea, vomiting.  She rates her pain 9/10 at time of my evaluation.   Fall  Back Pain      Home Medications Prior to Admission medications   Medication Sig Start Date End Date Taking? Authorizing Provider  cefadroxil (DURICEF) 500 MG capsule Take 1 capsule (500 mg total) by mouth 2 (two) times daily. 02/22/23  Yes Khaleah Duer H, PA-C  acetaminophen (TYLENOL) 500 MG tablet Take 2 tablets (1,000 mg total) by mouth every 6 (six) hours as needed. 02/05/23   Gailen Shelter, PA  albuterol (VENTOLIN HFA) 108 (90 Base) MCG/ACT inhaler Inhale 2 puffs into the lungs every 6 (six) hours as needed for wheezing or shortness of breath. 02/13/22   Caro Laroche, DO  ARIPiprazole (ABILIFY) 15 MG tablet Take 15 mg by mouth every morning. 06/10/21   [provider]  blood glucose meter kit and supplies Dispense based on patient and insurance preference. Use up to four times daily as directed. (FOR ICD-10 E10.9, E11.9). 10/22/22    Margarita Mail, DO  CAPLYTA 42 MG capsule Take 42 mg by mouth every evening. 06/08/21   [provider]  clopidogrel (PLAVIX) 75 MG tablet Take 75 mg by mouth daily. 02/25/22   [provider]  Continuous Blood Gluc Receiver (FREESTYLE LIBRE 2 READER) DEVI USE TO CHECK SUGARS DAILY 10/28/22   Orson Eva, NP  Continuous Glucose Sensor (FREESTYLE LIBRE 2 SENSOR) MISC 1 each by Does not apply route every 14 (fourteen) days. 01/15/23   Margarita Mail, DO  diazepam (VALIUM) 10 MG tablet Take 10 mg by mouth 3 (three) times daily. 03/27/22   [provider]  fluticasone-salmeterol (ADVAIR) 100-50 MCG/ACT AEPB Inhale 1 puff into the lungs 2 (two) times daily. 08/05/22   Margarita Mail, DO  furosemide (LASIX) 40 MG tablet Take 1 tablet (40 mg total) by mouth 2 (two) times daily as needed for edema (take 40 mg in the morning, can take additional 40 mg in afternoon if needed for edema). 02/06/23   Margarita Mail, DO  ibuprofen (ADVIL) 800 MG tablet Take 1 tablet (800 mg total) by mouth as needed for moderate pain. 01/15/23   Margarita Mail, DO  lamoTRIgine (LAMICTAL) 100 MG tablet Take 100 mg by mouth 2 (two) times daily.    [provider]  metFORMIN (GLUCOPHAGE-XR) 500 MG 24 hr tablet Take 1 tablet (500 mg total) by mouth 2 (two) times daily with a meal. 12/13/22   Margarita Mail, DO  omeprazole (PRILOSEC) 40 MG capsule Take 1 capsule (40  mg total) by mouth daily. 10/22/22   Margarita Mail, DO  potassium chloride SA (KLOR-CON M20) 20 MEQ tablet Take 1 tablet (20 mEq total) by mouth 2 (two) times daily. 12/13/22   Margarita Mail, DO  prazosin (MINIPRESS) 2 MG capsule Take 4 mg by mouth every evening. 02/15/21   [provider]  promethazine (PHENERGAN) 12.5 MG tablet Take 1 tablet (12.5 mg total) by mouth every 8 (eight) hours as needed for nausea or vomiting. 01/15/23   Margarita Mail, DO  traZODone (DESYREL) 150 MG tablet Take by mouth at  bedtime.    [provider]  Vilazodone HCl (VIIBRYD) 40 MG TABS Take 40 mg by mouth daily.    [provider]      Allergies    Gabapentin, Ibuprofen, Sulfa antibiotics, Tylenol [acetaminophen], Aspirin, Buprenorphine hcl, Carbamazepine, Codeine, Compazine, Elemental sulfur, Ioxaglate, Ivp dye [iodinated contrast media], Metrizamide, Naproxen, Norco [hydrocodone-acetaminophen], Other, Penicillin g, Prochlorperazine maleate, Reglan [metoclopramide], Toradol [ketorolac tromethamine], Tramadol, and Zofran [ondansetron hcl]    Review of Systems   Review of Systems  Musculoskeletal:  Positive for back pain.  All other systems reviewed and are negative.   Physical Exam Updated Vital Signs BP 113/75   Pulse 95   Temp 98 F (36.7 C) (Oral)   Resp 16   Ht 5\' 2"  (1.575 m)   Wt 81.6 kg   SpO2 96%   BMI 32.92 kg/m  Physical Exam Vitals and nursing note reviewed.  Constitutional:      General: She is not in acute distress.    Appearance: Normal appearance.  HENT:     Head: Normocephalic and atraumatic.     Comments: No evidence of Head trauma, no hemotympanum bilaterally. Eyes:     General:        Right eye: No discharge.        Left eye: No discharge.  Cardiovascular:     Rate and Rhythm: Normal rate and regular rhythm.     Heart sounds: No murmur heard.    No friction rub. No gallop.  Pulmonary:     Effort: Pulmonary effort is normal.     Breath sounds: Normal breath sounds.  Abdominal:     General: Bowel sounds are normal.     Palpations: Abdomen is soft.  Musculoskeletal:     Comments: Midline lumbar tenderness at around level of L4/L5, she has decreased strength of both lower extremities around 4/5, no focal unilateral deficit.  She additionally has some pain with attempted movement of the right knee, but no ligamentous laxity, or effusion noted.  Intact range of motion passively.  She moves both upper extremities with strength 5/5 and normal coordination.   Skin:    General: Skin is warm and dry.     Capillary Refill: Capillary refill takes less than 2 seconds.  Neurological:     Mental Status: She is alert and oriented to person, place, and time.     Comments: Cranial nerves II through XII grossly intact.  Intact finger-nose, intact heel-to-shin.  Romberg negative, gait normal.  Alert and oriented x3.  Moves all 4 limbs spontaneously, normal coordination.  No pronator drift.  Intact strength 5 out of 5 bilateral upper and lower extremities.    Psychiatric:        Mood and Affect: Mood normal.        Behavior: Behavior normal.     ED Results / Procedures / Treatments   Labs (all labs ordered are listed, but only  abnormal results are displayed) Labs Reviewed  BASIC METABOLIC PANEL - Abnormal; Notable for the following components:      Result Value   Glucose, Bld 155 (*)    All other components within normal limits  URINALYSIS, ROUTINE W REFLEX MICROSCOPIC - Abnormal; Notable for the following components:   Color, Urine STRAW (*)    Glucose, UA 50 (*)    Hgb urine dipstick MODERATE (*)    Leukocytes,Ua LARGE (*)    Bacteria, UA RARE (*)    All other components within normal limits  LACTIC ACID, PLASMA - Abnormal; Notable for the following components:   Lactic Acid, Venous 2.9 (*)    All other components within normal limits  CBC  LACTIC ACID, PLASMA  AMMONIA    EKG EKG Interpretation  Date/Time:  Saturday February 22 2023 10:29:17 EDT Ventricular Rate:  88 PR Interval:  195 QRS Duration: 95 QT Interval:  353 QTC Calculation: 428 R Axis:   81 Text Interpretation: Sinus rhythm Probable left atrial enlargement Low voltage, precordial leads Consider anterior infarct Borderline T abnormalities, inferior leads No significant change since last tracing Confirmed by Alvira Monday ([id number]) on 02/22/2023 12:06:22 PM  Radiology CT Lumbar Spine Wo Contrast  Result Date: 02/22/2023 CLINICAL DATA:  Low back pain, increased fracture  risk EXAM: CT LUMBAR SPINE WITHOUT CONTRAST TECHNIQUE: Multidetector CT imaging of the lumbar spine was performed without intravenous contrast administration. Multiplanar CT image reconstructions were also generated. RADIATION DOSE REDUCTION: This exam was performed according to the departmental dose-optimization program which includes automated exposure control, adjustment of the mA and/or kV according to patient size and/or use of iterative reconstruction technique. COMPARISON:  Jan 09, 2023 mm Jan 25, 2021 FINDINGS: Segmentation: 5 lumbar type vertebrae. Alignment: Normal. Vertebrae: No acute fracture or focal pathologic process. Osteopenia. Paraspinal and other soft tissues: Atherosclerotic calcifications of the aorta. Nonobstructing nephrolithiasis of the atrophic RIGHT kidney. Disc levels: Severe intervertebral disc space height loss with a disc bulge at L5-S1. Facet arthropathy. This results in mild bilateral neuroforaminal narrowing. Disc bulge with vacuum disc phenomenon at L4-5 combined with facet arthropathy results in mild bilateral neuroforaminal narrowing. No high-grade canal stenosis. IMPRESSION: 1. No acute fracture or traumatic listhesis of the lumbar spine. Aortic Atherosclerosis (ICD10-I70.0). Electronically Signed   By: Meda Klinefelter M.D.   On: 02/22/2023 11:52   CT Renal Stone Study  Result Date: 02/22/2023 CLINICAL DATA:  Abdominal/flank pain, stone suspected EXAM: CT ABDOMEN AND PELVIS WITHOUT CONTRAST TECHNIQUE: Multidetector CT imaging of the abdomen and pelvis was performed following the standard protocol without IV contrast. RADIATION DOSE REDUCTION: This exam was performed according to the departmental dose-optimization program which includes automated exposure control, adjustment of the mA and/or kV according to patient size and/or use of iterative reconstruction technique. COMPARISON:  Feb 05, 2023 FINDINGS: Evaluation is limited by lack of IV contrast. Lower chest: No acute  abnormality. Hepatobiliary: Hepatic steatosis.  Gallbladder is unremarkable. Pancreas: No peripancreatic fat stranding. Spleen: Unremarkable. Adrenals/Urinary Tract: Unchanged appearance of the adrenal glands. No hydronephrosis. Multiple nonobstructing RIGHT-sided nephrolithiasis. Atrophic appearance of the RIGHT kidney with multifocal areas of renal scarring, similar in comparison to prior. LEFT-sided cortical calcification. No obstructing nephrolithiasis. Bladder is unremarkable. Stomach/Bowel: No evidence of bowel obstruction. Diverticulosis without evidence of acute diverticulitis. Moderate colonic stool burden throughout the colon. The appendix is normal. Stomach is decompressed. Vascular/Lymphatic: Atherosclerotic calcifications of the nonaneurysmal abdominal aorta. No new suspicious lymphadenopathy. Reproductive: Status post hysterectomy. No adnexal masses. Other: No free  air or free fluid. Musculoskeletal: Degenerative changes of the lumbar spine. IMPRESSION: 1. No hydronephrosis. Nonobstructing RIGHT-sided nephrolithiasis within the atrophic RIGHT kidney. 2. Hepatic steatosis. Aortic Atherosclerosis (ICD10-I70.0). Electronically Signed   By: Meda Klinefelter M.D.   On: 02/22/2023 11:46   CT Head Wo Contrast  Result Date: 02/22/2023 CLINICAL DATA:  Neuro deficit. EXAM: CT HEAD WITHOUT CONTRAST TECHNIQUE: Contiguous axial images were obtained from the base of the skull through the vertex without intravenous contrast. RADIATION DOSE REDUCTION: This exam was performed according to the departmental dose-optimization program which includes automated exposure control, adjustment of the mA and/or kV according to patient size and/or use of iterative reconstruction technique. COMPARISON:  Brain CT 12/23/2022 FINDINGS: Brain: No evidence for acute cortically based infarct, intracranial hemorrhage, mass lesion or mass-effect. 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 Vascular: Mild intracranial atherosclerosis. Skull:  Intact. Sinuses/Orbits: Mild mucosal thickening paranasal sinuses. Other: None IMPRESSION: 1. No acute intracranial process. 2. 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 Electronically Signed   By: Annia Belt M.D.   On: 02/22/2023 11:39   DG Knee Complete 4 Views Right  Result Date: 02/22/2023 CLINICAL DATA:  57 year old female status post fall overnight. Difficulty walking. Weakness. EXAM: RIGHT KNEE - COMPLETE 4+ VIEW COMPARISON:  Right knee series 02/05/2023. FINDINGS: Bone mineralization is within normal limits. No evidence of joint effusion on cross-table lateral view. Maintained joint spaces and alignment. Patella intact. No osseous abnormality identified. No discrete soft tissue injury. IMPRESSION: Negative. Electronically Signed   By: Odessa Fleming M.D.   On: 02/22/2023 10:59   DG Pelvis Portable  Result Date: 02/22/2023 CLINICAL DATA:  57 year old female status post fall overnight. Difficulty walking. Weakness. EXAM: PORTABLE PELVIS 1-2 VIEWS COMPARISON:  CT Abdomen and Pelvis 02/05/2023. FINDINGS: AP view at 1050 hours. Femoral heads remain normally located. Bone mineralization is within normal limits for age. Pelvis appears intact. Symphysis and SI joints appear within normal limits. Grossly intact proximal femurs. Negative visible bowel gas pattern. Mild pelvic phleboliths. Lumbosacral junction chronic disc and endplate degeneration. IMPRESSION: No acute fracture or dislocation identified about the pelvis. Electronically Signed   By: Odessa Fleming M.D.   On: 02/22/2023 10:58    Procedures Procedures    Medications Ordered in ED Medications  morphine (PF) 2 MG/ML injection 2 mg (has no administration in time range)  morphine (PF) 4 MG/ML injection 4 mg (4 mg Intravenous Given 02/22/23 1238)    ED Course/ Medical Decision Making/ A&P                             Medical Decision Making Amount and/or Complexity of Data Reviewed Labs: ordered. Radiology: ordered.  Risk Prescription drug  management.   This patient is a 57 y.o. female  who presents to the ED for concern of fall, confusion, weak urine stream, lower back / coccyx pain .   Differential diagnoses prior to evaluation: The emergent differential diagnosis includes, but is not limited to,  acute fracture, head injury, stroke, uti, pyelonephritis, cauda equina . This is not an exhaustive differential.   Past Medical History / Co-morbidities / Social History: hepatitis, intraventricular hemorrhage, hypertension, cytotoxic brain edema, previous UTIs, encephalopathy, diabetes, bipolar disorder  Additional history: Chart reviewed. Pertinent results include: Reviewed lab work, imaging from previous emergency department visits  Physical Exam: Physical exam performed. The pertinent findings include: Patient with some tenderness of the lumbar spine around L4/L5, decree strength of bilateral lower extremity secondary to  pain.  She has mildly antalgic gait.  He is no focal neurologic deficits on my exam.  Signs have been stable in the emergency department.  Lab Tests/Imaging studies: I personally interpreted labs/imaging and the pertinent results include: Urinalysis with moderate hemoglobin, large leukocytes, 21-50 white blood cells and rare bacteria.  Seems clinically suspicious for urinary tract infection, CBC unremarkable, lactic acid normal, 1.3 on repeat, initially was elevated at 2.9 however, unclear etiology with normal anion gap, unremarkable BMP, no evidence of sepsis.  Normal ammonia..  I independently reviewed plain film pelvis x-ray, right knee x-ray, CT of the head, renal stone study and lumbar spine which showed no evidence of acute fracture, dislocation, or acute intra-abdominal abnormality I agree with the radiologist interpretation.  Cardiac monitoring: EKG obtained and interpreted by my attending physician which shows: Normal sinus rhythm, borderline T wave abnormality in inferior leads, no significant change from  baseline   Medications: I ordered medication including for pain.  I have reviewed the patients home medicines and have made adjustments as needed.   Disposition: After consideration of the diagnostic results and the patients response to treatment, I feel that patient is stable for discharge, since symptoms of urinary tract infection, encouraged Tylenol for pain control.   emergency department workup does not suggest an emergent condition requiring admission or immediate intervention beyond what has been performed at this time. The plan is: as above. The patient is safe for discharge and has been instructed to return immediately for worsening symptoms, change in symptoms or any other concerns.  Final Clinical Impression(s) / ED Diagnoses Final diagnoses:  Fall, initial encounter  Acute bilateral low back pain without sciatica  Acute cystitis without hematuria    Rx / DC Orders ED Discharge Orders          Ordered    cefadroxil (DURICEF) 500 MG capsule  2 times daily        02/22/23 1532              Sahib Pella, [place], PA-C 02/22/23 1536    Alvira Monday, MD 02/24/23 1121

## 2023-02-22 NOTE — Discharge Instructions (Signed)
Please use Tylenol for pain.  You may use 1000 mg of Tylenol every 6 hours.  Not to exceed 4 g of Tylenol within 24 hours.  Please take the entire course of antibiotics that prescribed for urinary tract infection and follow-up with your primary care doctor for resolution of symptoms

## 2023-02-24 ENCOUNTER — Telehealth: Payer: Self-pay

## 2023-02-24 NOTE — Transitions of Care (Post Inpatient/ED Visit) (Signed)
   02/24/2023  Name: MAZEY BALTON MRN: [id number] DOB: Aug 30, 1966  Today's TOC FU Call Status: Today's TOC FU Call Status:: Unsuccessul Call (1st Attempt) Unsuccessful Call (1st Attempt) Date: 02/24/23  Attempted to reach the patient regarding the most recent Inpatient/ED visit.  Follow Up Plan: Additional outreach attempts will be made to reach the patient to complete the Transitions of Care (Post Inpatient/ED visit) call.   Abelino Derrick, MHA [facility]  Managed [facility] Social Worker [phone]

## 2023-02-26 DIAGNOSIS — R001 Bradycardia, unspecified: Secondary | ICD-10-CM | POA: Diagnosis not present

## 2023-02-26 DIAGNOSIS — E669 Obesity, unspecified: Secondary | ICD-10-CM | POA: Diagnosis not present

## 2023-02-26 DIAGNOSIS — K219 Gastro-esophageal reflux disease without esophagitis: Secondary | ICD-10-CM | POA: Diagnosis not present

## 2023-02-26 DIAGNOSIS — I8290 Acute embolism and thrombosis of unspecified vein: Secondary | ICD-10-CM | POA: Diagnosis not present

## 2023-02-26 DIAGNOSIS — G4733 Obstructive sleep apnea (adult) (pediatric): Secondary | ICD-10-CM | POA: Diagnosis not present

## 2023-02-26 DIAGNOSIS — Z8673 Personal history of transient ischemic attack (TIA), and cerebral infarction without residual deficits: Secondary | ICD-10-CM | POA: Diagnosis not present

## 2023-02-26 DIAGNOSIS — E7800 Pure hypercholesterolemia, unspecified: Secondary | ICD-10-CM | POA: Diagnosis not present

## 2023-02-26 DIAGNOSIS — E119 Type 2 diabetes mellitus without complications: Secondary | ICD-10-CM | POA: Diagnosis not present

## 2023-02-26 DIAGNOSIS — I10 Essential (primary) hypertension: Secondary | ICD-10-CM | POA: Diagnosis not present

## 2023-03-06 DIAGNOSIS — M7662 Achilles tendinitis, left leg: Secondary | ICD-10-CM | POA: Diagnosis not present

## 2023-03-10 DIAGNOSIS — Z419 Encounter for procedure for purposes other than remedying health state, unspecified: Secondary | ICD-10-CM | POA: Diagnosis not present

## 2023-03-17 ENCOUNTER — Encounter: Payer: Self-pay | Admitting: *Deleted

## 2023-03-17 ENCOUNTER — Other Ambulatory Visit: Payer: Medicaid Other | Admitting: *Deleted

## 2023-03-17 NOTE — Patient Instructions (Signed)
Visit Information  Ms. Vanpelt was given information about Medicaid Managed Care team care coordination services as a part of their [facility] Medicaid benefit. SAMENTHA ELSAYED verbally consented to engagement with the [facility] Managed Care team.   If you are experiencing a medical emergency, please call 911 or report to your local emergency department or urgent care.   If you have a non-emergency medical problem during routine business hours, please contact your provider's office and ask to speak with a nurse.   For questions related to your [facility] health plan, please call: [phone] or go here:[email or web address]  If you would like to schedule transportation through your [facility] plan, please call the following number at least 2 days in advance of your appointment: [phone].   You can also use the MTM portal or MTM mobile app to manage your rides. Reimbursement for transportation is available through [facility]! For the portal, please go to [email or web address]  Call the [facility] Crisis Line at 650-290-2041, at any time, 24 hours a day, 7 days a week. If you are in danger or need immediate medical attention call 911.  If you would like help to quit smoking, call 1-800-QUIT-NOW (779-432-8410) OR Espaol: 1-855-Djelo-Ya (4-132-440-1027) o para ms informacin haga clic aqu or Text READY to 253-664 to register via text  Ms. Bluth - following are the goals we discussed in your visit today:   Goals Addressed   None     Please see education materials related to fall provided by MyChart link.  Patient verbalizes understanding of instructions and care plan provided today and agrees to view in MyChart. Active MyChart status and patient understanding of how to access instructions and care plan via MyChart confirmed with patient.     Telephone follow up appointment with Managed Medicaid care management team member scheduled for:05/19/23 @  12:30pm  Estanislado Emms RN, BSN [place]  Managed [facility] RN Care Coordinator [phone]   Following is a copy of your plan of care:  Care Plan : RN Care Manager Plan of Care  Updates made by Heidi Dach, RN since 03/17/2023 12:00 AM     Problem: Development of Plan of Care to address Health Management needs related to Stroke history      Long-Range Goal: Development of Plan of Care to address Health Management needs related to Stroke history   Start Date: 05/08/2022  Expected End Date: 04/09/2023  Priority: High  Note:   Current Barriers:  Chronic Disease Management support and education needs related to Improving Health with history of Stroke Patient receiving Vyepti infusions for headaches. No headache since first infusion one week ago. Patient requesting incontinence supplies.  RNCM Clinical Goal(s):  Patient will verbalize understanding of plan for management of Improving Health with history of stroke as evidenced by patient reports attend all scheduled medical appointments: PCP on 02/18/23 as evidenced by provider documentation in EMR        continue to work with RN Care Manager and/or Social Worker to address care management and care coordination needs related to improving health with history of stroke as evidenced by adherence to CM Team Scheduled appointments     through collaboration with RN Care manager, provider, and care team.   Interventions: Inter-disciplinary care team collaboration (see longitudinal plan of care) Evaluation of current treatment plan related to  self management and patient's adherence to plan as established by provider Reviewed upcoming appointments including: PCP 03/19/23, 03/20/23 with Spine and Neurosurgery, 04/30/23 with Neurology   Diabetes  Interventions:  (Status:  Goal Met.) Long Term Goal Assessed patient's understanding of A1c goal: <7% Reviewed medications with patient and discussed importance of medication adherence Counseled on  importance of regular laboratory monitoring as prescribed Discussed plans with patient for ongoing care management follow up and provided patient with direct contact information for care management team Reviewed scheduled/upcoming provider appointments including: 10/09/22 with PCP and Neurology and 10/23/22 with [facility] for Lung Cancer Screening Assessed social determinant of health barriers Provided encouragement as patient works to lower her A1C Advised patient continue to work on increasing exercise and improving diet  Lab Results  Component Value Date   HGBA1C 6.8 (A) 12/13/2022   Stroke:  (Status:Goal on track:  Yes.) Long Term Goal Reviewed Importance of taking all medications as prescribed Reviewed Importance of attending all scheduled provider appointments Advised to report any changes in symptoms or exercise tolerance Assessed social determinant of health barriers Reviewed the importance of exercise Encouraged patient to continue to exercise at home(stationary bike) Discussed Vyepti infusions-advised patient to call and scheduled next infusion Provided patient with [facility] [phone] Advised patient to make sure she is drinking 6-8 glasses of water a day to improve hydration Advised patient to discuss needing incontinence supplies with PCP on 03/19/23 Advised patient to discuss episode of recent fall with Neurology and PCP Ensured patient has grab bars in bathroom  Patient Goals/Self-Care Activities: Take medications as prescribed   Attend all scheduled provider appointments Call provider office for new concerns or questions

## 2023-03-17 NOTE — Patient Outreach (Signed)
Medicaid Managed Care   Nurse Care Manager Note  03/17/2023 Name:  Stephanie [name] CURRENT MRN:  [id number] DOB:  01-16-1966  Stephanie [name] is an 57 y.o. year old female who is a primary patient of Margarita Mail, DO.  The [facility] Managed Care Coordination team was consulted for assistance with:    Stroke Hx  Stephanie [name] was given information about Medicaid Managed Care Coordination team services today. Stephanie [name] Patient agreed to services and verbal consent obtained.  Engaged with patient by telephone for follow up visit in response to provider referral for case management and/or care coordination services.   Assessments/Interventions:  Review of past medical history, allergies, medications, health status, including review of consultants reports, laboratory and other test data, was performed as part of comprehensive evaluation and provision of chronic care management services.  SDOH (Social Determinants of Health) assessments and interventions performed: SDOH Interventions    Flowsheet Row Patient Outreach Telephone from 03/17/2023 in [place] POPULATION HEALTH DEPARTMENT Patient Outreach Telephone from 11/12/2022 in [place] POPULATION HEALTH DEPARTMENT Patient Outreach Telephone from 10/01/2022 in [place] POPULATION HEALTH DEPARTMENT Patient Outreach Telephone from 08/14/2022 in [place] POPULATION HEALTH DEPARTMENT Patient Outreach Telephone from 05/08/2022 in [facility] Community Care Coordination Office Visit from 03/29/2022 in [facility]  SDOH Interventions        Food Insecurity Interventions -- Intervention Not Indicated -- -- Intervention Not Indicated --  Housing Interventions Intervention Not Indicated -- Intervention Not Indicated -- Intervention Not Indicated --  Transportation Interventions Payor Benefit Intervention Not Indicated Intervention Not Indicated -- Other (Comment)  [Provided with [employer] transportation] --   Utilities Interventions Intervention Not Indicated -- -- Intervention Not Indicated -- --  Depression Interventions/Treatment  -- -- -- -- -- Medication       Care Plan  Allergies  Allergen Reactions   Gabapentin Itching   Ibuprofen Itching   Sulfa Antibiotics Hives   Tylenol [Acetaminophen] Itching   Aspirin Itching   Buprenorphine Hcl Itching   Carbamazepine Itching, Other (See Comments) and Rash    Makes her feel like something is crawling under her skin.   Codeine Itching and Other (See Comments)    Makes her feel like something is crawling under her skin. Can take, sometimes makes her itch   Compazine Itching and Other (See Comments)    "makes my skin crawl"   Elemental Sulfur Hives, Rash and Other (See Comments)    Skin Rashes, Hives   Ioxaglate Itching   Ivp Dye [Iodinated Contrast Media] Itching and Other (See Comments)    Makes her itch really bad. Had to get 2-3 shots of Benadryl when she was in the hospital.   Metrizamide Itching   Naproxen Itching   Norco [Hydrocodone-Acetaminophen] Itching   Other Itching and Other (See Comments)    Makes her itch really bad. Had to get 2-3 shots of Benadryl when she was in the hospital.   Penicillin G Hives, Rash and Other (See Comments)    Has patient had a PCN reaction causing immediate rash, facial/tongue/throat swelling, SOB or lightheadedness with hypotension: Yes Has patient had a PCN reaction causing severe rash involving mucus membranes or skin necrosis: No Has patient had a PCN reaction that required hospitalization: No Has patient had a PCN reaction occurring within the last 10 years: No If all of the above answers are "NO", then may proceed with Cephalosporin use.    Prochlorperazine Maleate Other (See Comments)  Compazine makes her skin crawl - needs 2-3 shots of Benadryl to get relief.   Reglan [Metoclopramide] Itching and Other (See Comments)    Makes her skin crawl   Toradol [Ketorolac Tromethamine] Itching    Tramadol Itching   Zofran [Ondansetron Hcl] Itching    Medications Reviewed Today     Reviewed by Heidi Dach, RN (Registered Nurse) on 03/17/23 at 1255  Med List Status: <None>   Medication Order Taking? Sig Documenting Provider Last Dose Status Informant  acetaminophen (TYLENOL) 500 MG tablet [id number] Yes Take 2 tablets (1,000 mg total) by mouth every 6 (six) hours as needed. Stephanie Shelter, PA Taking Active   albuterol (VENTOLIN HFA) 108 (90 Base) MCG/ACT inhaler [id number] Yes Inhale 2 puffs into the lungs every 6 (six) hours as needed for wheezing or shortness of breath. Stephanie Laroche, DO Taking Active Self, Multiple Informants  ARIPiprazole (ABILIFY) 15 MG tablet [id number] Yes Take 15 mg by mouth every morning. [provider] Taking Active Self, Multiple Informants  blood glucose meter kit and supplies [id number] Yes Dispense based on patient and insurance preference. Use up to four times daily as directed. (FOR ICD-10 E10.9, E11.9). Margarita Mail, DO Taking Active   CAPLYTA 42 MG capsule [id number] Yes Take 42 mg by mouth every evening. [provider] Taking Active Self, Multiple Informants  cefadroxil (DURICEF) 500 MG capsule [id number] Yes Take 1 capsule (500 mg total) by mouth 2 (two) times daily. Prosperi, Christian H, PA-C Taking Active   clopidogrel (PLAVIX) 75 MG tablet [id number] Yes Take 75 mg by mouth daily. [provider] Taking Active Self, Multiple Informants  Continuous Blood Gluc Receiver (FREESTYLE LIBRE 2 READER) DEVI [id number] Yes USE TO CHECK SUGARS DAILY Stephanie Eva, NP Taking Active   Continuous Glucose Sensor (FREESTYLE LIBRE 2 SENSOR) MISC [id number] Yes 1 each by Does not apply route every 14 (fourteen) days. Margarita Mail, DO Taking Active   diazepam (VALIUM) 10 MG tablet [id number] Yes Take 10 mg by mouth 3 (three) times daily. [provider] Taking Active Self, Multiple Informants           Med Note  Stephanie [name], Harlin Rain Oct 10, 2022  6:19 PM)    fluticasone-salmeterol (ADVAIR) 100-50 MCG/ACT AEPB [id number] Yes Inhale 1 puff into the lungs 2 (two) times daily. Margarita Mail, DO Taking Active   furosemide (LASIX) 40 MG tablet [id number] Yes Take 1 tablet (40 mg total) by mouth 2 (two) times daily as needed for edema (take 40 mg in the morning, can take additional 40 mg in afternoon if needed for edema). Margarita Mail, DO Taking Active   ibuprofen (ADVIL) 800 MG tablet [id number] Yes Take 1 tablet (800 mg total) by mouth as needed for moderate pain. Margarita Mail, DO Taking Active   lamoTRIgine (LAMICTAL) 100 MG tablet [id number] Yes Take 100 mg by mouth 2 (two) times daily. [provider] Taking Active   metFORMIN (GLUCOPHAGE-XR) 500 MG 24 hr tablet [id number] Yes Take 1 tablet (500 mg total) by mouth 2 (two) times daily with a meal. Margarita Mail, DO Taking Active   omeprazole (PRILOSEC) 40 MG capsule [id number] Yes Take 1 capsule (40 mg total) by mouth daily. Margarita Mail, DO Taking Active   potassium chloride SA (KLOR-CON M20) 20 MEQ tablet [id number] Yes Take 1 tablet (20 mEq total) by mouth 2 (two) times daily. Margarita Mail, DO Taking Active   prazosin (MINIPRESS) 2 MG capsule [id number] Yes Take 4 mg by  mouth every evening. [provider] Taking Active Self, Multiple Informants  promethazine (PHENERGAN) 12.5 MG tablet [id number] No Take 1 tablet (12.5 mg total) by mouth every 8 (eight) hours as needed for nausea or vomiting.  Patient not taking: Reported on 03/17/2023   Margarita Mail, DO Not Taking Active   traZODone (DESYREL) 150 MG tablet [id number] Yes Take by mouth at bedtime. [provider] Taking Active   Vilazodone HCl (VIIBRYD) 40 MG TABS [id number] Yes Take 40 mg by mouth daily. 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   Bipolar disorder (HCC) 05/09/2016   Hyperlipidemia, unspecified 05/09/2016   Irritable bowel syndrome 05/09/2016   Lumbago with sciatica 05/09/2016   Esophageal reflux 05/09/2016   Scoliosis 05/09/2016   Diverticulosis of colon 05/09/2016   DDD (degenerative disc disease), cervical 05/09/2016   ICH (intracerebral hemorrhage) (HCC)    Essential hypertension, malignant 04/19/2016   Cytotoxic brain edema (HCC) 04/19/2016   IVH (intraventricular hemorrhage) (HCC) 04/18/2016   Bilateral carpal tunnel syndrome 04/12/2016   Falls frequently 01/12/2016   Trigeminal neuralgia 06/08/2015   Sacroiliac dysfunction 03/04/2014   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 or radiculopathy 05/20/2013   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   Tobacco use disorder 09/11/2012   History of cervical cancer 07/30/2012   Hepatitis C     Conditions to be addressed/monitored per PCP order:   Stroke Hx  Care Plan : RN Care Manager Plan of Care  Updates made by Heidi Dach, RN since 03/17/2023 12:00 AM     Problem: Development of Plan of Care to address Health Management needs related to Stroke history      Long-Range Goal: Development of Plan of Care to address Health Management needs related to Stroke history   Start Date: 05/08/2022  Expected End Date: 04/09/2023  Priority: High  Note:   Current Barriers:  Chronic Disease Management support and education needs related to  Improving Health with history of Stroke Patient receiving Vyepti infusions for headaches. No headache since first infusion one week ago. Patient requesting incontinence supplies.  RNCM Clinical Goal(s):  Patient will verbalize understanding of plan for management of Improving Health with history of stroke as evidenced by patient reports attend all scheduled medical appointments: PCP on 02/18/23 as evidenced by provider documentation in EMR        continue to work with RN Care Manager and/or Social Worker to address care management and care coordination needs related to improving health with history of stroke as evidenced by adherence to CM Team Scheduled appointments     through collaboration with Medical illustrator, provider, and care team.   Interventions: Inter-disciplinary care team collaboration (see longitudinal plan of care) Evaluation of current treatment plan related to  self management and patient's adherence to plan as established by provider Reviewed upcoming appointments including: PCP 03/19/23,  03/20/23 with Spine and Neurosurgery, 04/30/23 with Neurology   Diabetes Interventions:  (Status:  Goal Met.) Long Term Goal Assessed patient's understanding of A1c goal: <7% Reviewed medications with patient and discussed importance of medication adherence Counseled on importance of regular laboratory monitoring as prescribed Discussed plans with patient for ongoing care management follow up and provided patient with direct contact information for care management team Reviewed scheduled/upcoming provider appointments including: 10/09/22 with PCP and Neurology and 10/23/22 with [facility] for Lung Cancer Screening Assessed social determinant of health barriers Provided encouragement as patient works to lower her A1C Advised patient continue to work on increasing exercise and improving diet  Lab Results  Component Value Date   HGBA1C 6.8 (A) 12/13/2022   Stroke:  (Status:Goal on track:   Yes.) Long Term Goal Reviewed Importance of taking all medications as prescribed Reviewed Importance of attending all scheduled provider appointments Advised to report any changes in symptoms or exercise tolerance Assessed social determinant of health barriers Reviewed the importance of exercise Encouraged patient to continue to exercise at home(stationary bike) Discussed Vyepti infusions-advised patient to call and scheduled next infusion Provided patient with [facility] [phone] Advised patient to make sure she is drinking 6-8 glasses of water a day to improve hydration Advised patient to discuss needing incontinence supplies with PCP on 03/19/23 Advised patient to discuss episode of recent fall with Neurology and PCP Ensured patient has grab bars in bathroom  Patient Goals/Self-Care Activities: Take medications as prescribed   Attend all scheduled provider appointments Call provider office for new concerns or questions        Follow Up:  Patient agrees to Care Plan and Follow-up.  Plan: The Managed Medicaid care management team will reach out to the patient again over the next 60 days.  Date/time of next scheduled RN care management/care coordination outreach:  05/19/23 @ 12:30pm  Estanislado Emms RN, BSN [place]  Managed [facility] RN Care Coordinator [phone]

## 2023-03-18 NOTE — Telephone Encounter (Unsigned)
Copied from CRM [id number]. Topic: Referral - Status >> Mar 18, 2023  1:21 PM Ja-Kwan M wrote: Reason for CRM: Pt requests call back regarding the referral for a colonoscopy. Cb# [phone]

## 2023-03-18 NOTE — Telephone Encounter (Signed)
Due for 1 yr f/u colonoscopy at [facility]

## 2023-03-19 ENCOUNTER — Ambulatory Visit: Payer: Medicaid Other | Admitting: Internal Medicine

## 2023-03-20 DIAGNOSIS — Z7689 Persons encountering health services in other specified circumstances: Secondary | ICD-10-CM | POA: Diagnosis not present

## 2023-03-21 IMAGING — CR DG KNEE COMPLETE 4+V*R*
4 series · 4 of 4 positions shown · non-contrast
Comparison: Right knee x-rays dated June 26, 2018.

CLINICAL DATA: Right lateral knee pain after tripping over the dog
a week ago.

EXAM:
RIGHT KNEE - COMPLETE 4+ VIEW

[x knee ap right (1 of 3)]
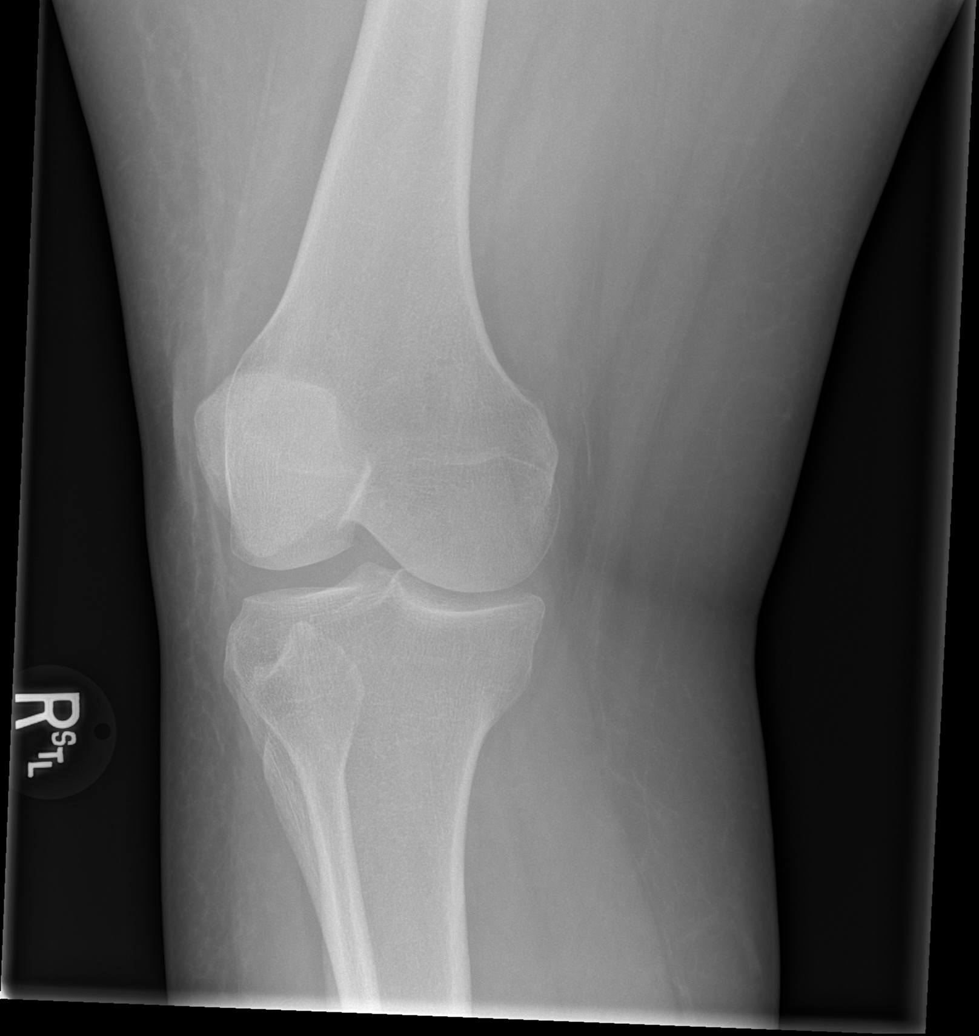

[x knee ap right (2 of 3)]
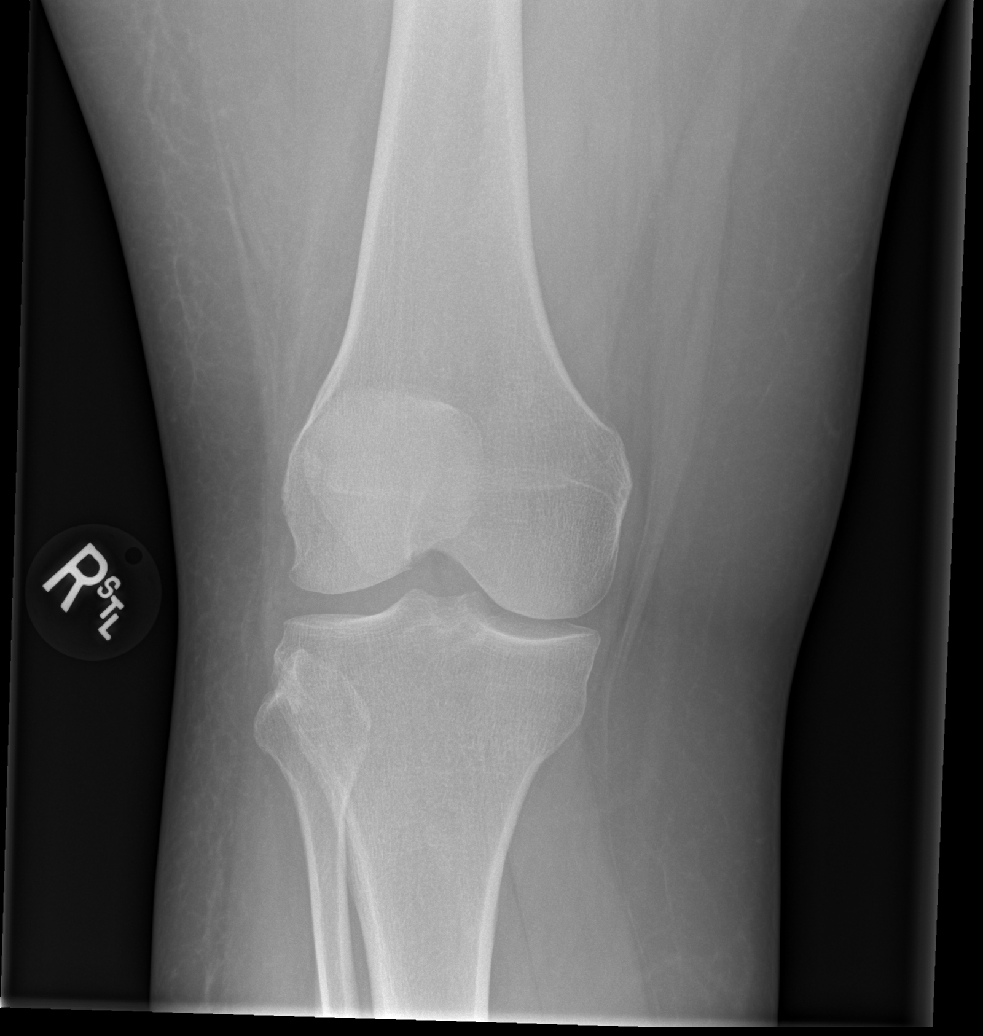

[x knee ap right (3 of 3)]
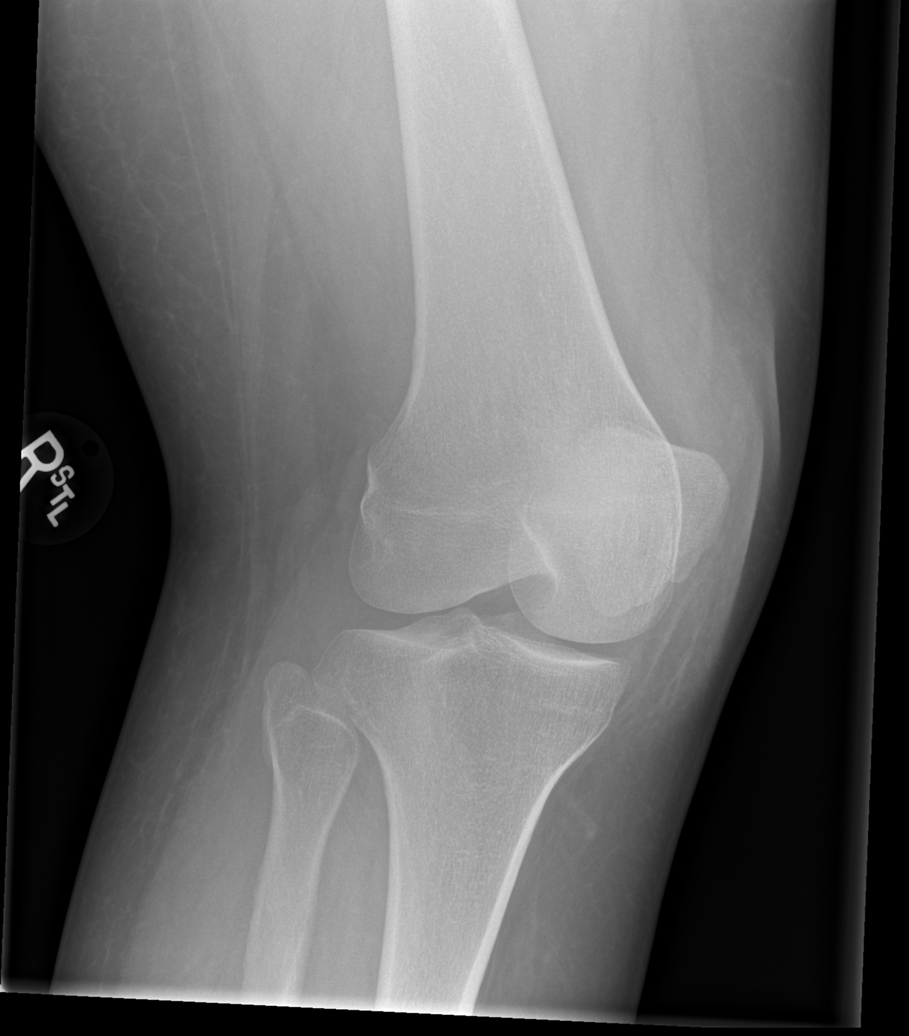

[x knee lat right]
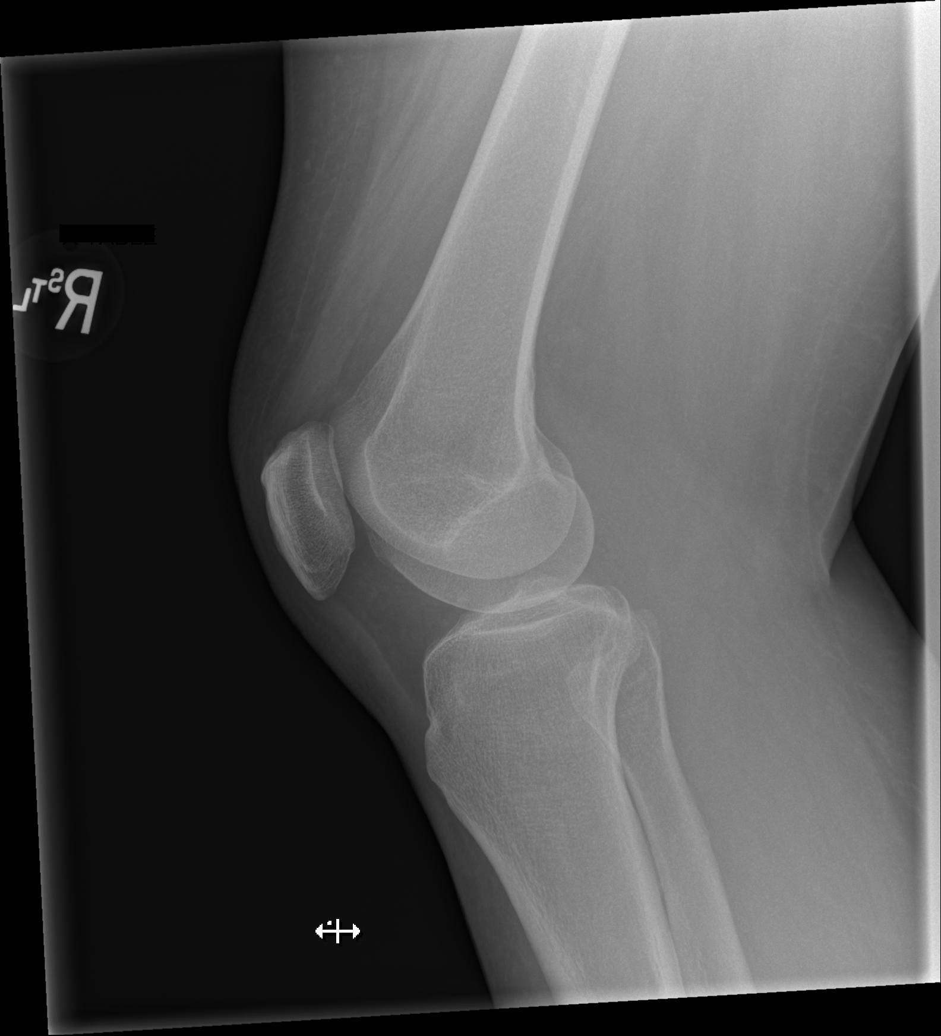

[4 of 4 positions shown; findings below may reference images not displayed]

FINDINGS: No evidence of fracture, dislocation, or joint effusion. No evidence
of arthropathy or other focal bone abnormality. Soft tissues are
unremarkable.
IMPRESSION: Negative.

## 2023-03-22 ENCOUNTER — Other Ambulatory Visit: Payer: Self-pay | Admitting: Internal Medicine

## 2023-03-22 DIAGNOSIS — E1165 Type 2 diabetes mellitus with hyperglycemia: Secondary | ICD-10-CM

## 2023-03-24 NOTE — Telephone Encounter (Signed)
Requested Prescriptions  Refused Prescriptions Disp Refills   pioglitazone (ACTOS) 15 MG tablet [Pharmacy Med Name: Pioglitazone HCl 15 MG Oral Tablet] 90 tablet 0    Sig: Take 1 tablet by mouth once daily     Endocrinology:  Diabetes - Glitazones - pioglitazone Passed - 03/22/2023  3:51 PM      Passed - HBA1C is between 0 and 7.9 and within 180 days    Hemoglobin A1C  Date Value Ref Range Status  12/13/2022 6.8 (A) 4.0 - 5.6 % Final   Hgb A1c MFr Bld  Date Value Ref Range Status  02/13/2022 6.0 (H) <5.7 % of total Hgb Final    Comment:    For someone without known diabetes, a hemoglobin  A1c value between 5.7% and 6.4% is consistent with prediabetes and should be confirmed with a  follow-up test. . For someone with known diabetes, a value <7% indicates that their diabetes is well controlled. A1c targets should be individualized based on duration of diabetes, age, comorbid conditions, and other considerations. . This assay result is consistent with an increased risk of diabetes. . Currently, no consensus exists regarding use of hemoglobin A1c for diagnosis of diabetes for children. Verna Czech - Valid encounter within last 6 months    Recent Outpatient Visits           2 months ago Type 2 diabetes mellitus with hyperglycemia, without long-term current use of insulin [facility])   [place] [facility] Margarita Mail, DO   3 months ago Type 2 diabetes mellitus with hyperglycemia, without long-term current use of insulin [facility])   [place] [facility] Margarita Mail, DO   5 months ago Increased urinary frequency   [facility] Margarita Mail, DO   6 months ago Type 2 diabetes mellitus with hyperglycemia, without long-term current use of insulin [facility])   [facility] Margarita Mail, DO   7 months ago Essential hypertension, malignant   [facility]  Margarita Mail, DO       Future Appointments             In 1 week Margarita Mail, DO [facility]   In 4 months Margarita Mail, DO [facility], [facility]

## 2023-03-25 DIAGNOSIS — Z7689 Persons encountering health services in other specified circumstances: Secondary | ICD-10-CM | POA: Diagnosis not present

## 2023-03-28 DIAGNOSIS — G4733 Obstructive sleep apnea (adult) (pediatric): Secondary | ICD-10-CM | POA: Diagnosis not present

## 2023-03-31 ENCOUNTER — Ambulatory Visit: Payer: Medicaid Other | Admitting: Internal Medicine

## 2023-03-31 DIAGNOSIS — M25512 Pain in left shoulder: Secondary | ICD-10-CM | POA: Diagnosis not present

## 2023-03-31 DIAGNOSIS — M19012 Primary osteoarthritis, left shoulder: Secondary | ICD-10-CM | POA: Diagnosis not present

## 2023-03-31 DIAGNOSIS — M545 Low back pain, unspecified: Secondary | ICD-10-CM | POA: Diagnosis not present

## 2023-03-31 DIAGNOSIS — Z7689 Persons encountering health services in other specified circumstances: Secondary | ICD-10-CM | POA: Diagnosis not present

## 2023-04-01 ENCOUNTER — Ambulatory Visit: Payer: Self-pay

## 2023-04-01 DIAGNOSIS — Z7689 Persons encountering health services in other specified circumstances: Secondary | ICD-10-CM | POA: Diagnosis not present

## 2023-04-01 NOTE — Telephone Encounter (Signed)
Summary: Swelling in feet   Swelling in feet         Chief Complaint: Swelling both feet and ankles."The fluid pill is not working." No availability with PCP this week. Needs 2 day notice for transportation needs. Symptoms: Above Frequency: 3 months Pertinent Negatives: Patient denies SOB or chest pain. Disposition: [] ED /[] Urgent Care (no appt availability in office) / [x] Appointment(In office/virtual)/ []  [place] [facility]/ [] Home Care/ [] Refused Recommended Disposition /[] [place] [facility]/ []  Follow-up with PCP Additional Notes: Pt. Agrees with appointment. Will call back for worsening of symptoms.  Reason for Disposition  [1] MILD swelling of both ankles (i.e., pedal edema) AND [2] new-onset or worsening  Answer Assessment - Initial Assessment Questions 1. ONSET: "When did the swelling start?" (e.g., minutes, hours, days)     3-4 months 2. LOCATION: "What part of the leg is swollen?"  "Are both legs swollen or just one leg?"     Both feet ankles 3. SEVERITY: "How bad is the swelling?" (e.g., localized; mild, moderate, severe)   - Localized: Small area of swelling localized to one leg.   - MILD pedal edema: Swelling limited to foot and ankle, pitting edema < 1/4 inch (6 mm) deep, rest and elevation eliminate most or all swelling.   - MODERATE edema: Swelling of lower leg to knee, pitting edema > 1/4 inch (6 mm) deep, rest and elevation only partially reduce swelling.   - SEVERE edema: Swelling extends above knee, facial or hand swelling present.      Mild 4. REDNESS: "Does the swelling look red or infected?"     No 5. PAIN: "Is the swelling painful to touch?" If Yes, ask: "How painful is it?"   (Scale 1-10; mild, moderate or severe)     8 6. FEVER: "Do you have a fever?" If Yes, ask: "What is it, how was it measured, and when did it start?"      No 7. CAUSE: "What do you think is causing the leg swelling?"     Unsure 8. MEDICAL HISTORY: "Do you have a history of  blood clots (e.g., DVT), cancer, heart failure, kidney disease, or liver failure?"     No 9. RECURRENT SYMPTOM: "Have you had leg swelling before?" If Yes, ask: "When was the last time?" "What happened that time?"     No 10. OTHER SYMPTOMS: "Do you have any other symptoms?" (e.g., chest pain, difficulty breathing)       No 11. PREGNANCY: "Is there any chance you are pregnant?" "When was your last menstrual period?"       No  Protocols used: Leg Swelling and Edema-A-AH

## 2023-04-03 DIAGNOSIS — Z7689 Persons encountering health services in other specified circumstances: Secondary | ICD-10-CM | POA: Diagnosis not present

## 2023-04-04 ENCOUNTER — Ambulatory Visit: Payer: Medicaid Other | Admitting: Nurse Practitioner

## 2023-04-07 ENCOUNTER — Ambulatory Visit: Payer: Medicaid Other | Admitting: Nurse Practitioner

## 2023-04-07 ENCOUNTER — Ambulatory Visit: Payer: Self-pay | Admitting: *Deleted

## 2023-04-07 NOTE — Progress Notes (Deleted)
There were no vitals taken for this visit.   Subjective:    [name]: Stephanie [name], female    DOB: May 02, 1966, 57 y.o.   MRN: [id number]  HPI: Stephanie [name] is a 57 y.o. female  No chief complaint on file.  Bilateral lower extremity edema: she reports her swelling has gotten worse over the last three months.    Relevant past medical, surgical, family and social history reviewed and updated as indicated. Interim medical history since our last visit reviewed. Allergies and medications reviewed and updated.  Review of Systems Constitutional: Negative for fever or weight change.  Respiratory: Negative for cough and shortness of breath.   Cardiovascular: Negative for chest pain or palpitations.  Gastrointestinal: Negative for abdominal pain, no bowel changes.  Musculoskeletal: Negative for gait problem or joint swelling.  Skin: Negative for rash.  Neurological: Negative for dizziness or headache.  No other specific complaints in a complete review of systems (except as listed in HPI above).      Objective:    There were no vitals taken for this visit.  Wt Readings from Last 3 Encounters:  02/22/23 180 lb (81.6 kg)  02/05/23 198 lb 6.6 oz (90 kg)  01/15/23 221 lb 11.2 oz (100.6 kg)    Physical Exam  Constitutional: Patient appears well-developed and well-nourished. Obese *** No distress.  HEENT: head atraumatic, normocephalic, pupils equal and reactive to light, ears ***, neck supple, throat within normal limits Cardiovascular: Normal rate, regular rhythm and normal heart sounds.  No murmur heard. No BLE edema. Pulmonary/Chest: Effort normal and breath sounds normal. No respiratory distress. Abdominal: Soft.  There is no tenderness. Psychiatric: Patient has a normal mood and affect. behavior is normal. Judgment and thought content normal.  Results for orders placed or performed during the hospital encounter of 02/22/23  CBC  Result Value Ref Range   WBC 5.1 4.0 -  10.5 K/uL   RBC 4.43 3.87 - 5.11 MIL/uL   Hemoglobin 13.7 12.0 - 15.0 g/dL   HCT 52.8 41.3 - 24.4 %   MCV 98.4 80.0 - 100.0 fL   MCH 30.9 26.0 - 34.0 pg   MCHC 31.4 30.0 - 36.0 g/dL   RDW 01.0 27.2 - 53.6 %   Platelets 173 150 - 400 K/uL   nRBC 0.0 0.0 - 0.2 %  Basic metabolic panel  Result Value Ref Range   Sodium 140 135 - 145 mmol/L   Potassium 4.0 3.5 - 5.1 mmol/L   Chloride 99 98 - 111 mmol/L   CO2 29 22 - 32 mmol/L   Glucose, Bld 155 (H) 70 - 99 mg/dL   BUN 16 6 - 20 mg/dL   Creatinine, Ser 6.44 0.44 - 1.00 mg/dL   Calcium 9.5 8.9 - 03.4 mg/dL   GFR, Estimated >74 >25 mL/min   Anion gap 12 5 - 15  Urinalysis, Routine w reflex microscopic -Urine, Clean Catch  Result Value Ref Range   Color, Urine STRAW (A) YELLOW   APPearance CLEAR CLEAR   Specific Gravity, Urine 1.005 1.005 - 1.030   pH 7.0 5.0 - 8.0   Glucose, UA 50 (A) NEGATIVE mg/dL   Hgb urine dipstick MODERATE (A) NEGATIVE   Bilirubin Urine NEGATIVE NEGATIVE   Ketones, ur NEGATIVE NEGATIVE mg/dL   Protein, ur NEGATIVE NEGATIVE mg/dL   Nitrite NEGATIVE NEGATIVE   Leukocytes,Ua LARGE (A) NEGATIVE   RBC / HPF 0-5 0 - 5 RBC/hpf   WBC, UA 21-50 0 - 5  WBC/hpf   Bacteria, UA RARE (A) NONE SEEN   Squamous Epithelial / HPF 0-5 0 - 5 /HPF  Lactic acid, plasma  Result Value Ref Range   Lactic Acid, Venous 2.9 (HH) 0.5 - 1.9 mmol/L  Lactic acid, plasma  Result Value Ref Range   Lactic Acid, Venous 1.3 0.5 - 1.9 mmol/L  Ammonia  Result Value Ref Range   Ammonia 16 9 - 35 umol/L      Assessment & Plan:   Problem List Items Addressed This Visit   None    Follow up plan: No follow-ups on file.

## 2023-04-07 NOTE — Telephone Encounter (Signed)
Summary: Swollen Feet   Swollen feet increasingly for 3 months.  Best contact: [phone]          Chief Complaint: bilateral feet swelling  Symptoms: bilateral feet swelling , pain , has to wear "flip flops" regular shoes dont fit. Feet numb, redness noted. Hx diabetic . Frequency: 3 months  Pertinent Negatives: Patient denies swelling to knees, no chest pain no difficulty breathing no fever Disposition: [] ED /[] Urgent Care (no appt availability in office) / [x] Appointment(In office/virtual)/ []  [facility]/ [] Home Care/ [] Refused Recommended Disposition /[] [facility]/ []  Follow-up with PCP Additional Notes:   Appt scheduled for tomorrow with J. Pender,NP no available appt with PCP.       Reason for Disposition  [1] Swollen foot AND [2] no fever  (Exceptions: localized bump from bunions, calluses, insect bite, sting)  Answer Assessment - Initial Assessment Questions 1. ONSET: "When did the pain start?"      3 months ago  2. LOCATION: "Where is the pain located?"      Bilateral feet  3. PAIN: "How bad is the pain?"    (Scale 1-10; or mild, moderate, severe)  - MILD (1-3): doesn't interfere with normal activities.   - MODERATE (4-7): interferes with normal activities (e.g., work or school) or awakens from sleep, limping.   - SEVERE (8-10): excruciating pain, unable to do any normal activities, unable to walk.      Feels frozen and numb 4. WORK OR EXERCISE: "Has there been any recent work or exercise that involved this part of the body?"      Na  5. CAUSE: "What do you think is causing the foot pain?"     Not sure  6. OTHER SYMPTOMS: "Do you have any other symptoms?" (e.g., leg pain, rash, fever, numbness)     Bilateral foot pian , redness noted, numbness, and swelling  can bend toes  7. PREGNANCY: "Is there any chance you are pregnant?" "When was your last menstrual period?"     na  Protocols used: Foot Pain-A-AH

## 2023-04-07 NOTE — Telephone Encounter (Signed)
Pt called back and agent stated pt had transportation issues and cannot come until tomorrow.

## 2023-04-07 NOTE — Telephone Encounter (Signed)
Summary: Swollen Feet   Swollen feet increasingly for 3 months.  Best contact: [phone]      Called patient to review feet swelling for 3 months. No answer, LVMTCB .

## 2023-04-08 ENCOUNTER — Ambulatory Visit: Payer: Medicaid Other | Admitting: Family Medicine

## 2023-04-08 ENCOUNTER — Other Ambulatory Visit: Payer: Self-pay | Admitting: Internal Medicine

## 2023-04-08 DIAGNOSIS — K219 Gastro-esophageal reflux disease without esophagitis: Secondary | ICD-10-CM

## 2023-04-09 NOTE — Telephone Encounter (Signed)
Requested Prescriptions  Pending Prescriptions Disp Refills   omeprazole (PRILOSEC) 40 MG capsule [Pharmacy Med Name: Omeprazole 40 MG Oral Capsule Delayed Release] 90 capsule 1    Sig: Take 1 capsule by mouth once daily     Gastroenterology: Proton Pump Inhibitors Passed - 04/08/2023 10:55 AM      Passed - Valid encounter within last 12 months    Recent Outpatient Visits           2 months ago Type 2 diabetes mellitus with hyperglycemia, without long-term current use of insulin [facility])   [place] [facility] Margarita Mail, DO   3 months ago Type 2 diabetes mellitus with hyperglycemia, without long-term current use of insulin [facility])   [place] [facility] Margarita Mail, DO   5 months ago Increased urinary frequency   [facility] Margarita Mail, DO   7 months ago Type 2 diabetes mellitus with hyperglycemia, without long-term current use of insulin [facility])   [facility] Margarita Mail, DO   8 months ago Essential hypertension, malignant   [facility] Margarita Mail, DO       Future Appointments             In 5 days [name], PA-C [facility], [facility]   In 3 months Margarita Mail, DO [facility], [facility]

## 2023-04-10 DIAGNOSIS — Z419 Encounter for procedure for purposes other than remedying health state, unspecified: Secondary | ICD-10-CM | POA: Diagnosis not present

## 2023-04-14 ENCOUNTER — Ambulatory Visit: Payer: Medicaid Other | Admitting: Family Medicine

## 2023-04-14 IMAGING — CT CT HEAD W/O CM
3 series · 14 of 47 positions shown, 16 images · non-contrast
Comparison: CT head and cervical spine 01/25/2021

CLINICAL DATA: Tripped and fell onto face this morning

EXAM:
CT HEAD WITHOUT CONTRAST
CT CERVICAL SPINE WITHOUT CONTRAST
TECHNIQUE: Multidetector CT imaging of the head and cervical spine was
performed following the standard protocol without intravenous
contrast. Multiplanar CT image reconstructions of the cervical spine
were also generated.

[Series 2: head wo · axial · 0.47mm/px · z∈[-58,+72]mm · 8 of 32 slices shown, 10 images]
[im 3/32  brain]
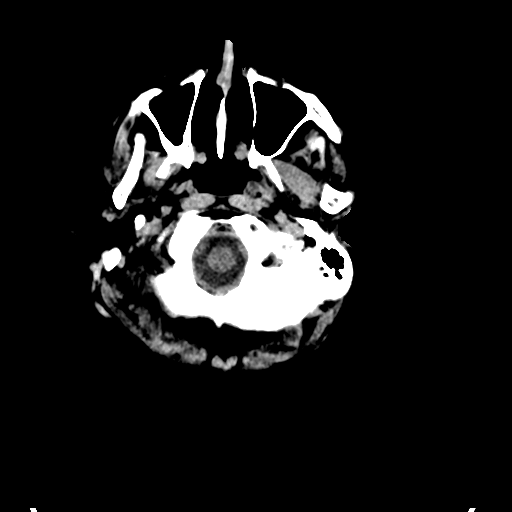
[im 3/32  bone]
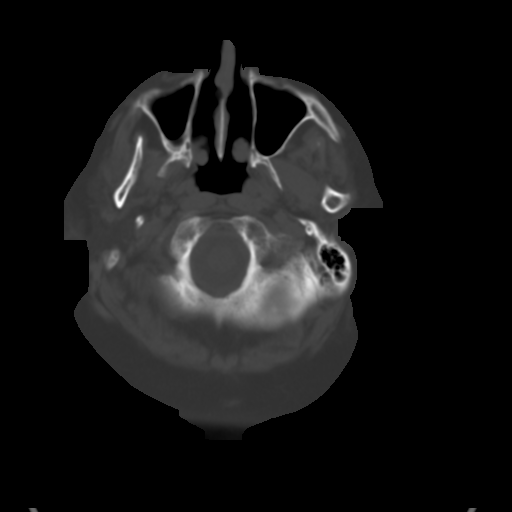
[im 7/32  brain]
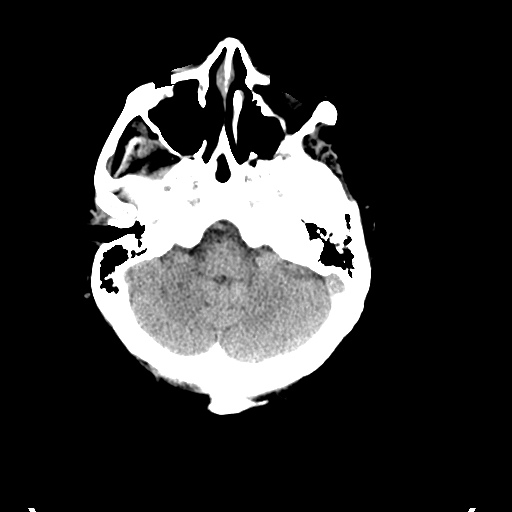
[im 10/32  brain]
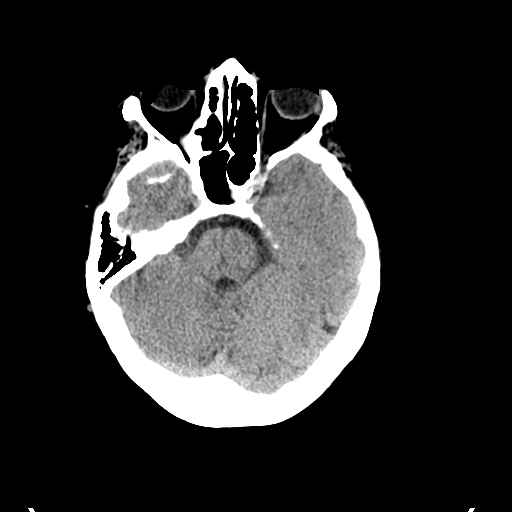
[im 14/32  brain]
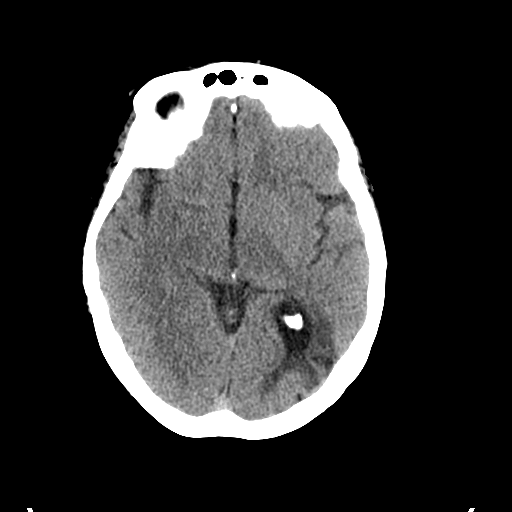
[im 18/32  brain]
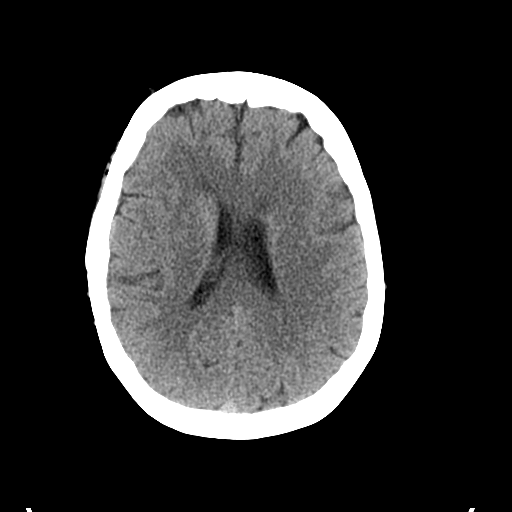
[im 18/32  bone]
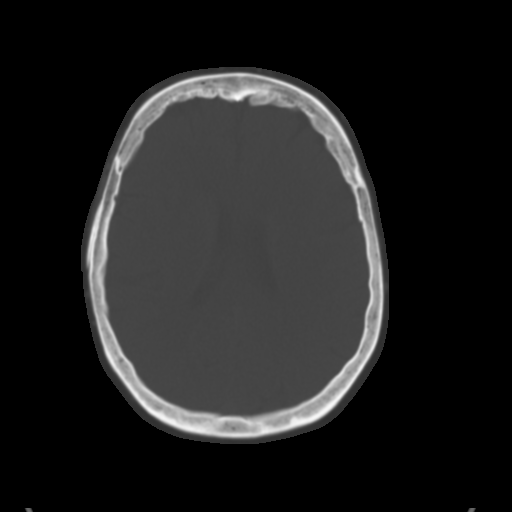
[im 22/32  brain]
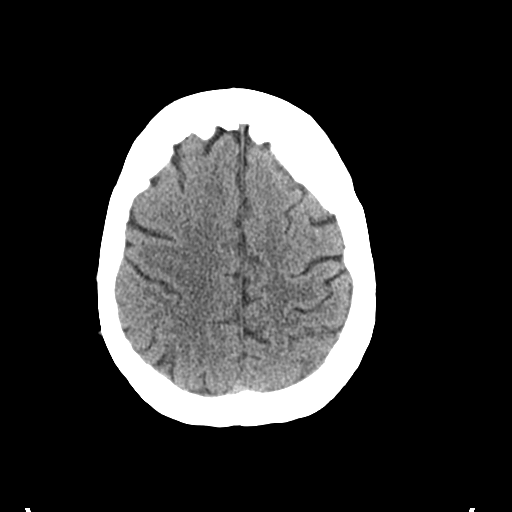
[im 25/32  brain]
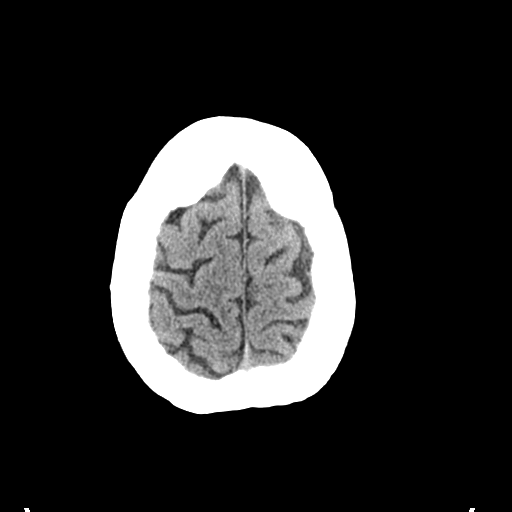
[im 29/32  brain]
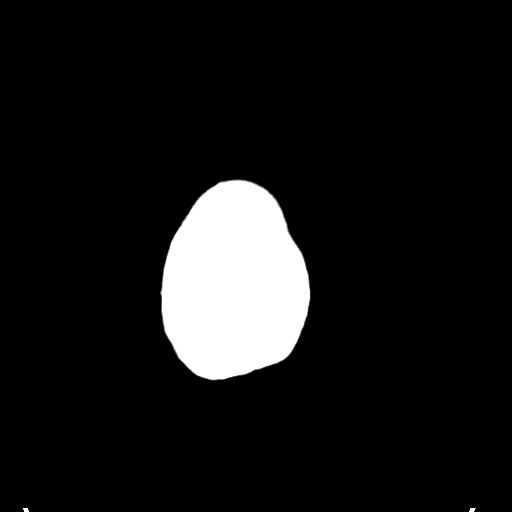

[Series 4: coronal soft tissue · coronal · 0.31mm/px · 3 of 73 slices shown]
[im 25/73  brain]
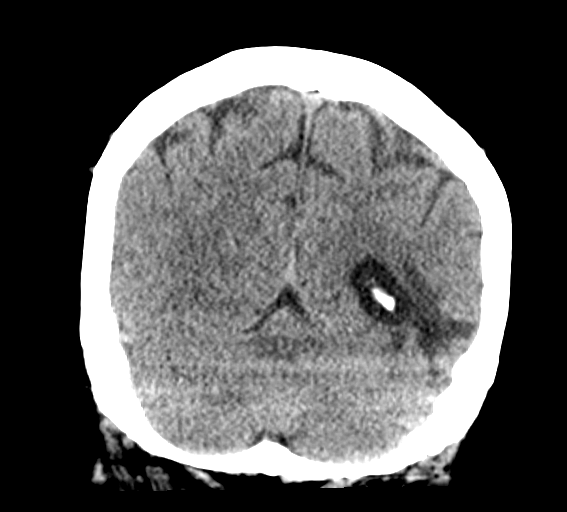
[im 33/73  brain]
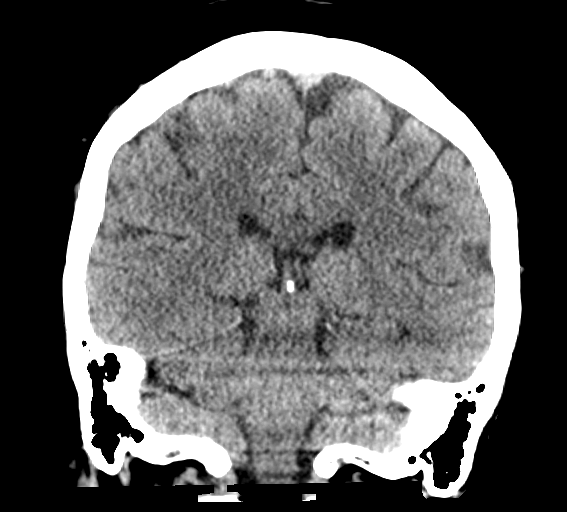
[im 41/73  brain]
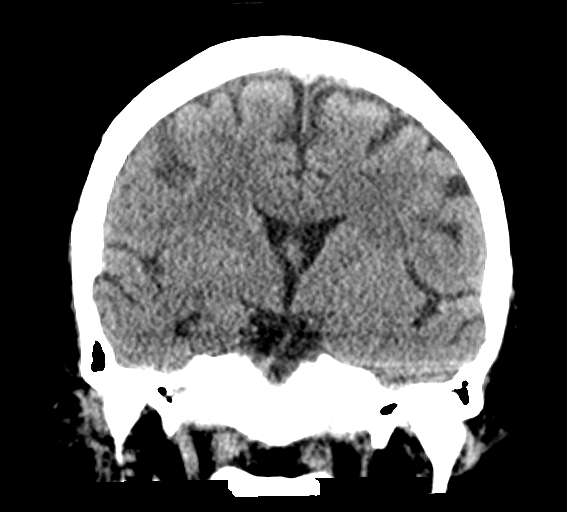

[Series 5: sagittal soft tissue · sagittal · 0.31mm/px · 3 of 58 slices shown]
[im 20/58  brain]
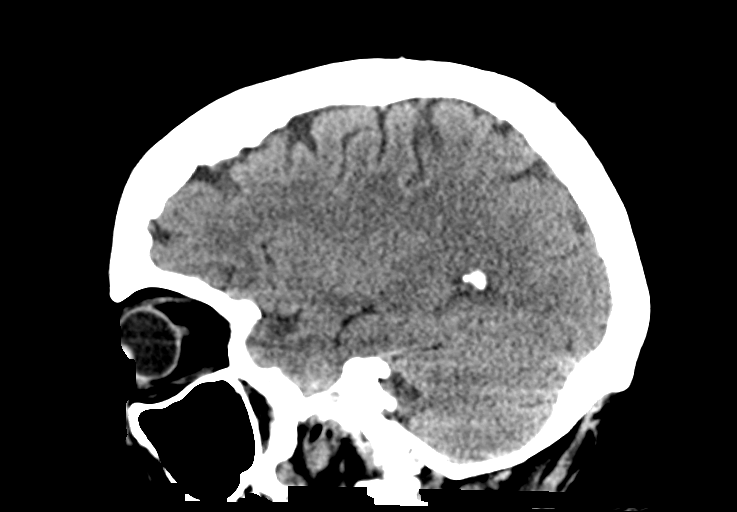
[im 29/58  brain]
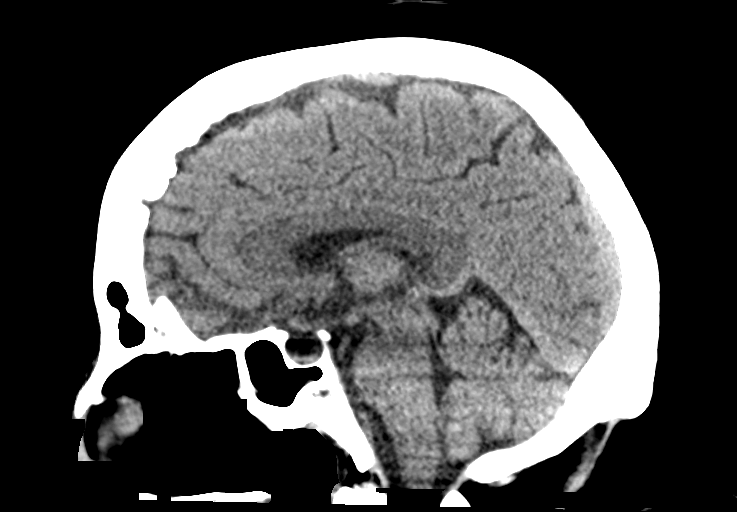
[im 39/58  brain]
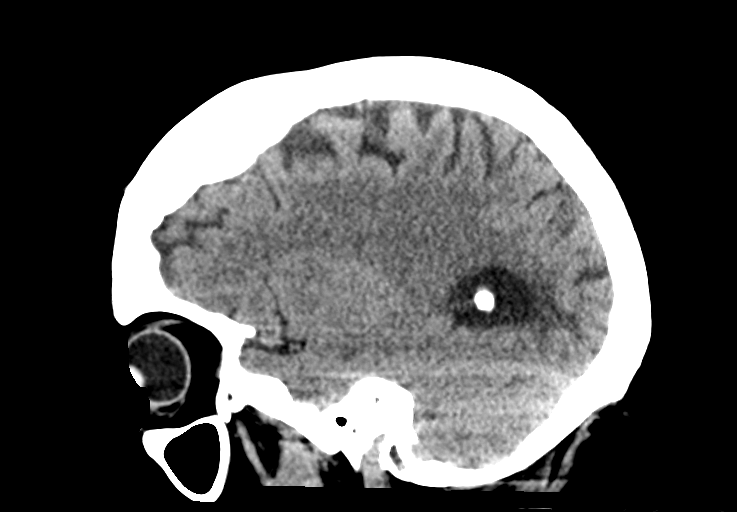

[14 of 47 positions shown; findings below may reference images not displayed]

FINDINGS: CT HEAD FINDINGS

Brain: Normal ventricular morphology. No midline shift or mass
effect. Again identified posterior LEFT temporal lobe infarct which
extends into inferior LEFT parietal lobe. No intracranial
hemorrhage, mass lesion, or evidence of acute infarction. No
extra-axial fluid collections.

Vascular: No hyperdense vessels.

Skull: Intact

Sinuses/Orbits: Clear

Other: N/A

CT CERVICAL SPINE FINDINGS

Alignment: Normal

Skull base and vertebrae: Vertebral body heights maintained. Disc
space narrowing with endplate spur formation at C5-C6 and C6-C7.
Mild scattered facet degenerative changes. No fracture, subluxation,
or bone destruction.

Soft tissues and spinal canal: Prevertebral soft tissues normal
thickness. Atherosclerotic calcifications at carotid bifurcations.

Disc levels:  No specific abnormalities

Upper chest: Lung apices clear

Other: N/A
IMPRESSION: Old posterior LEFT temporal lobe/inferior LEFT parietal lobe
infarct.

No acute intracranial abnormalities.

Degenerative disc and facet disease changes of the cervical spine.

No acute cervical spine abnormalities.

## 2023-04-15 ENCOUNTER — Ambulatory Visit: Payer: Medicaid Other | Admitting: Internal Medicine

## 2023-04-15 ENCOUNTER — Encounter: Payer: Self-pay | Admitting: Internal Medicine

## 2023-04-15 ENCOUNTER — Ambulatory Visit (INDEPENDENT_AMBULATORY_CARE_PROVIDER_SITE_OTHER): Payer: Medicaid Other | Admitting: Internal Medicine

## 2023-04-15 VITALS — BP 124/84 | HR 86 | Temp 97.9°F | Resp 18 | Ht 62.0 in | Wt 226.0 lb

## 2023-04-15 DIAGNOSIS — E1165 Type 2 diabetes mellitus with hyperglycemia: Secondary | ICD-10-CM | POA: Diagnosis not present

## 2023-04-15 DIAGNOSIS — E8770 Fluid overload, unspecified: Secondary | ICD-10-CM | POA: Diagnosis not present

## 2023-04-15 DIAGNOSIS — Z7984 Long term (current) use of oral hypoglycemic drugs: Secondary | ICD-10-CM | POA: Diagnosis not present

## 2023-04-15 DIAGNOSIS — Z7689 Persons encountering health services in other specified circumstances: Secondary | ICD-10-CM | POA: Diagnosis not present

## 2023-04-15 MED ORDER — WEGOVY 0.25 MG/0.5ML ~~LOC~~ SOAJ
0.2500 mg | SUBCUTANEOUS | 0 refills | Status: AC
Start: 2023-04-15 — End: ?

## 2023-04-15 NOTE — Progress Notes (Signed)
Acute Office Visit  Subjective:     [name]: Stephanie [name], female    DOB: August 15, 1966, 57 y.o.   MRN: [id number]  Chief Complaint  Patient presents with   Edema    Feet and ankles    HPI Patient is in today for swelling in feet and ankles. States this has been going on nearly all summer. Even bilaterally. No pain. Taking Lasix 40 mg BID nearly daily. Will elevate legs, doesn't use compression stockings. Trying to increase hydration, decrease sodium. No shortness of breath.   Diabetes, Type 2: -Last A1c 4/24 6.8% -Medications: Metformin 500 mg BID (had been on Glipizide in the past). Had been prescribed Actos but not currently taking. Tried to prescribe Rybelsus in the past but cost was too much.  -Patient is compliant with the above medications and reports no side effects. Diarrhea stopped since switching to Metformin ER -Checking BG at home: has been over 200 recently  -Eye exam: UTD 8/23 -Foot exam: UTD 9/23 -Microalbumin: Due  -Statin: yes -PNA vaccine: UTD -Denies symptoms of hypoglycemia, polyuria, polydipsia, numbness extremities, foot ulcers/trauma.   Review of Systems  Constitutional:  Negative for chills and fever.  Respiratory:  Negative for shortness of breath.   Cardiovascular:  Positive for leg swelling. Negative for chest pain.        Objective:    BP 124/84   Pulse 86   Temp 97.9 F (36.6 C)   Resp 18   Ht 5\' 2"  (1.575 m)   Wt 226 lb (102.5 kg)   SpO2 92%   BMI 41.34 kg/m  BP Readings from Last 3 Encounters:  04/15/23 124/84  02/22/23 124/82  02/05/23 134/76   Wt Readings from Last 3 Encounters:  04/15/23 226 lb (102.5 kg)  02/22/23 180 lb (81.6 kg)  02/05/23 198 lb 6.6 oz (90 kg)      Physical Exam Constitutional:      Appearance: Normal appearance.  HENT:     Head: Normocephalic and atraumatic.  Eyes:     Conjunctiva/sclera: Conjunctivae normal.  Cardiovascular:     Rate and Rhythm: Normal rate and regular rhythm.   Pulmonary:     Effort: Pulmonary effort is normal.     Breath sounds: Normal breath sounds. No rales.  Musculoskeletal:     Right lower leg: Edema present.     Left lower leg: Edema present.     Comments: 2+ BLE pitting edema  Skin:    General: Skin is warm and dry.  Neurological:     General: No focal deficit present.     Mental Status: She is alert. Mental status is at baseline.  Psychiatric:        Mood and Affect: Mood normal.     No results found for any visits on 04/15/23.      Assessment & Plan:   1. Type 2 diabetes mellitus with hyperglycemia, without long-term current use of insulin [facility]): Patient wanting Wegovy prescribed to help with weight loss. Will start lowest dose 0.25 mg weekly. Discussed all potential side effects. A1c in May controlled, but states sugars have been higher more recently. Only on Metformin 500 mg daily for diabetes currently. Follow up in 1 month.   - Semaglutide-Weight Management (WEGOVY) 0.25 MG/0.5ML SOAJ; Inject 0.25 mg into the skin once a week.  Dispense: 2 mL; Refill: 0 - HgB A1c  2. Hypervolemia, unspecified hypervolemia type: Recheck BMP, BNP. Discussed keeping fluid intake < 2 L a day, sodium <  2 g a day. Recommend compression stockings as well, continue to elevate legs. Recheck at follow up.   - Basic Metabolic Panel (BMET) - B Nat Peptide   Return in about 4 weeks (around 05/13/2023).  Margarita Mail, DO

## 2023-04-15 NOTE — Patient Instructions (Signed)
It was great seeing you today!  Plan discussed at today's visit: -Blood work ordered today, results will be uploaded to MyChart.  -For legs, recommend keeping legs elevated, use compression stockings (can get at pharmacy or can go to [facility] here in [place] to be measured for customized compression stockings), keep fluid less than 2 liters a day, keep sodium to less than 2 g a day and continue Lasix -Will prescribe Wegovy 0.25 mg to take once a week  Follow up in: 1 month   Take care and let us know if you have any questions or concerns prior to your next visit.  Dr. Caralee Ates  Edema  Edema is when you have too much fluid in your body or under your skin. Edema may make your legs, feet, and ankles swell. Swelling often happens in looser tissues, such as around your eyes. This is a common condition. It gets more common as you get older. There are many possible causes of edema. These include: Eating too much salt (sodium). Being on your feet or sitting for a long time. Certain medical conditions, such as: Pregnancy. Heart failure. Liver disease. Kidney disease. Cancer. Hot weather may make edema worse. Edema is usually painless. Your skin may look swollen or shiny. Follow these instructions at home: Medicines Take over-the-counter and prescription medicines only as told by your doctor. Your doctor may prescribe a medicine to help your body get rid of extra water (diuretic). Take this medicine if you are told to take it. Eating and drinking Eat a low-salt (low-sodium) diet as told by your doctor. Sometimes, eating less salt may reduce swelling. Depending on the cause of your swelling, you may need to limit how much fluid you drink (fluid restriction). General instructions Raise the injured area above the level of your heart while you are sitting or lying down. Do not sit still or stand for a long time. Do not wear tight clothes. Do not wear garters on your upper  legs. Exercise your legs. This can help the swelling go down. Wear compression stockings as told by your doctor. It is important that these are the right size. These should be prescribed by your doctor to prevent possible injuries. If elastic bandages or wraps are recommended, use them as told by your doctor. Contact a doctor if: Treatment is not working. You have heart, liver, or kidney disease and have symptoms of edema. You have sudden and unexplained weight gain. Get help right away if: You have shortness of breath or chest pain. You cannot breathe when you lie down. You have pain, redness, or warmth in the swollen areas. You have heart, liver, or kidney disease and get edema all of a sudden. You have a fever and your symptoms get worse all of a sudden. These symptoms may be an emergency. Get help right away. Call 911. Do not wait to see if the symptoms will go away. Do not drive yourself to the hospital. Summary Edema is when you have too much fluid in your body or under your skin. Edema may make your legs, feet, and ankles swell. Swelling often happens in looser tissues, such as around your eyes. Raise the injured area above the level of your heart while you are sitting or lying down. Follow your doctor's instructions about diet and how much fluid you can drink. This information is not intended to replace advice given to you by your health care provider. Make sure you discuss any questions you have with your  health care provider. Document Revised: 04/30/2021 Document Reviewed: 04/30/2021 Elsevier Patient Education  2024 [employer].

## 2023-04-21 ENCOUNTER — Other Ambulatory Visit: Payer: Self-pay | Admitting: Internal Medicine

## 2023-04-21 DIAGNOSIS — M7989 Other specified soft tissue disorders: Secondary | ICD-10-CM

## 2023-04-22 ENCOUNTER — Ambulatory Visit: Payer: Self-pay

## 2023-04-22 NOTE — Telephone Encounter (Signed)
Message from Onley M sent at 04/22/2023 10:43 AM EDT  Summary: Pt has some questions and concerns about the glucose meter.   Pt requests that nurse return her call because she has some questions and concerns about the glucose meter. Cb# [phone]         Chief Complaint: requesting glucose meter and lancets and strips for back up in case Libre fails Symptoms: n/a Frequency: n/a Pertinent Negatives: Patient denies n/a Disposition: [] ED /[] Urgent Care (no appt availability in office) / [] Appointment(In office/virtual)/ []  [place] [facility]/ [] Home Care/ [] Refused Recommended Disposition /[] [facility]/ [x]  Follow-up with PCP Additional Notes: was ordered in February but not active at [facility]  Reason for Disposition  [1] Prescription prescribed recently is not at pharmacy AND [2] triager has access to patient's EMR AND [3] prescription is recorded in the EMR  Answer Assessment - Initial Assessment Questions 1. DRUG NAME: "What medicine do you need to have refilled?"     Wanting Glucose meter for back up if [place] fails 2. REFILLS REMAINING: "How many refills are remaining?" (Note: The label on the medicine or pill bottle will show how many refills are remaining. If there are no refills remaining, then a renewal may be needed.)     Zero  Protocols used: Medication Refill and Renewal Call-A-AH

## 2023-04-22 NOTE — Telephone Encounter (Signed)
She lost meter on arm

## 2023-04-22 NOTE — Telephone Encounter (Signed)
Requested Prescriptions  Pending Prescriptions Disp Refills   furosemide (LASIX) 40 MG tablet [Pharmacy Med Name: Furosemide 40 MG Oral Tablet] 30 tablet 0    Sig: TAKE 1 TABLET BY MOUTH TWICE DAILY AS NEEDED FOR  EDEMA  IN  THE  MORNING  CAN  TAKE  ADDITIONAL  TABLET  IN  AFTERNOON  IF  NEEDED     Cardiovascular:  Diuretics - Loop Failed - 04/21/2023 11:10 AM      Failed - Mg Level in normal range and within 180 days    Magnesium  Date Value Ref Range Status  11/20/2018 2.1 1.7 - 2.4 mg/dL Final    Comment:    Performed at [facility], [address] [place], [place] [address]         Passed - K in normal range and within 180 days    Potassium  Date Value Ref Range Status  04/15/2023 4.1 3.5 - 5.3 mmol/L Final  03/30/2014 3.7 3.5 - 5.1 mmol/L Final         Passed - Ca in normal range and within 180 days    Calcium  Date Value Ref Range Status  04/15/2023 9.8 8.6 - 10.4 mg/dL Final   Calcium, Total  Date Value Ref Range Status  03/30/2014 9.4 8.5 - 10.1 mg/dL Final   Calcium, Ion  Date Value Ref Range Status  03/07/2018 0.91 (L) 1.15 - 1.40 mmol/L Final         Passed - Na in normal range and within 180 days    Sodium  Date Value Ref Range Status  04/15/2023 141 135 - 146 mmol/L Final  06/27/2016 147 (H) 134 - 144 mmol/L Final  03/30/2014 135 (L) 136 - 145 mmol/L Final         Passed - Cr in normal range and within 180 days    Creat  Date Value Ref Range Status  04/15/2023 0.72 0.50 - 1.03 mg/dL Final   Creatinine, Urine  Date Value Ref Range Status  02/20/2022 87 20 - 275 mg/dL Final         Passed - Cl in normal range and within 180 days    Chloride  Date Value Ref Range Status  04/15/2023 103 98 - 110 mmol/L Final  03/30/2014 105 98 - 107 mmol/L Final         Passed - Last BP in normal range    BP Readings from Last 1 Encounters:  04/15/23 124/84         Passed - Valid encounter within last 6 months    Recent Outpatient Visits            1 week ago Type 2 diabetes mellitus with hyperglycemia, without long-term current use of insulin [facility])   [place] [facility] Margarita Mail, DO   3 months ago Type 2 diabetes mellitus with hyperglycemia, without long-term current use of insulin [facility])   [place] [facility] Margarita Mail, DO   4 months ago Type 2 diabetes mellitus with hyperglycemia, without long-term current use of insulin [facility])    [facility] Margarita Mail, DO   6 months ago Increased urinary frequency   [facility] Margarita Mail, DO   7 months ago Type 2 diabetes mellitus with hyperglycemia, without long-term current use of insulin [facility])   [facility] Margarita Mail, DO       Future Appointments  In 2 weeks Margarita Mail, DO [place] [facility], [facility]   In 3 months Margarita Mail, DO [facility], [facility]

## 2023-04-23 ENCOUNTER — Other Ambulatory Visit: Payer: Self-pay | Admitting: Internal Medicine

## 2023-04-23 DIAGNOSIS — E1165 Type 2 diabetes mellitus with hyperglycemia: Secondary | ICD-10-CM

## 2023-04-23 MED ORDER — BLOOD GLUCOSE METER KIT: PACK | 0 refills | Status: DC

## 2023-04-24 ENCOUNTER — Telehealth: Payer: Self-pay | Admitting: Internal Medicine

## 2023-04-24 DIAGNOSIS — E1165 Type 2 diabetes mellitus with hyperglycemia: Secondary | ICD-10-CM

## 2023-04-24 MED ORDER — FREESTYLE LIBRE 2 READER DEVI
0 refills | Status: DC
Start: 2023-04-24 — End: 2023-05-20

## 2023-04-24 NOTE — Telephone Encounter (Signed)
Do freestyle refill

## 2023-04-24 NOTE — Telephone Encounter (Signed)
Copied from CRM [id number]. Topic: General - Other >> Apr 23, 2023  4:28 PM Franchot Heidelberg wrote: Reason for CRM: Pt called reporitng that the pharmacy needs a verbal order called in for her Glucose Meter  [facility] [address] [place], [place] - [address] [place] [address] Phone: [phone] Fax: [phone]  blood glucose meter kit and supplies

## 2023-04-24 NOTE — Telephone Encounter (Signed)
Faxed order to pharmacy late yesterday afternoon

## 2023-05-01 ENCOUNTER — Telehealth: Payer: Self-pay | Admitting: Internal Medicine

## 2023-05-01 NOTE — Telephone Encounter (Signed)
PA started

## 2023-05-01 NOTE — Telephone Encounter (Signed)
Copied from CRM [id number]. Topic: General - Inquiry >> May 01, 2023 10:10 AM Haroldine Laws wrote: Reason for CRM: pt called asking for a PA foe the [facility] prescription that was sent over to the pharmacy on 8/6/  she said it would be 1600.00 out of pocket.  [facility] [address]  [employer]  [phone]

## 2023-05-07 ENCOUNTER — Ambulatory Visit: Payer: Medicaid Other | Admitting: Internal Medicine

## 2023-05-07 DIAGNOSIS — G43719 Chronic migraine without aura, intractable, without status migrainosus: Secondary | ICD-10-CM | POA: Diagnosis not present

## 2023-05-07 DIAGNOSIS — Z7689 Persons encountering health services in other specified circumstances: Secondary | ICD-10-CM | POA: Diagnosis not present

## 2023-05-11 DIAGNOSIS — Z419 Encounter for procedure for purposes other than remedying health state, unspecified: Secondary | ICD-10-CM | POA: Diagnosis not present

## 2023-05-16 ENCOUNTER — Other Ambulatory Visit: Payer: Self-pay | Admitting: Internal Medicine

## 2023-05-16 DIAGNOSIS — F410 Panic disorder [episodic paroxysmal anxiety] without agoraphobia: Secondary | ICD-10-CM | POA: Diagnosis not present

## 2023-05-16 DIAGNOSIS — F319 Bipolar disorder, unspecified: Secondary | ICD-10-CM | POA: Diagnosis not present

## 2023-05-16 DIAGNOSIS — F411 Generalized anxiety disorder: Secondary | ICD-10-CM | POA: Diagnosis not present

## 2023-05-16 DIAGNOSIS — F4312 Post-traumatic stress disorder, chronic: Secondary | ICD-10-CM | POA: Diagnosis not present

## 2023-05-16 DIAGNOSIS — Z1211 Encounter for screening for malignant neoplasm of colon: Secondary | ICD-10-CM

## 2023-05-16 NOTE — Telephone Encounter (Signed)
Copied from CRM [id number]. Topic: Referral - Request for Referral >> May 16, 2023  9:35 AM De Blanch wrote: Has patient seen PCP for this complaint? Yes.   *If NO, is insurance requiring patient see PCP for this issue before PCP can refer them? Referral for which specialty: Gastroenterology  Preferred provider/office: [facility] at [facility] / Address: [address], [place], [place] [address] Reason for referral: Pt is requesting a referral for colonoscopy to [facility]. She stated last year she was unable to have it done and was told to have it done in a year, which would be this year.  Please advise.

## 2023-05-18 ENCOUNTER — Other Ambulatory Visit: Payer: Self-pay | Admitting: Internal Medicine

## 2023-05-18 DIAGNOSIS — E876 Hypokalemia: Secondary | ICD-10-CM

## 2023-05-18 DIAGNOSIS — M7989 Other specified soft tissue disorders: Secondary | ICD-10-CM

## 2023-05-18 DIAGNOSIS — E1165 Type 2 diabetes mellitus with hyperglycemia: Secondary | ICD-10-CM

## 2023-05-19 ENCOUNTER — Other Ambulatory Visit: Payer: Medicaid Other | Admitting: *Deleted

## 2023-05-19 ENCOUNTER — Encounter: Payer: Self-pay | Admitting: Internal Medicine

## 2023-05-19 ENCOUNTER — Ambulatory Visit (INDEPENDENT_AMBULATORY_CARE_PROVIDER_SITE_OTHER): Payer: Medicaid Other | Admitting: Internal Medicine

## 2023-05-19 VITALS — BP 130/86 | HR 91 | Temp 97.9°F | Ht 62.0 in | Wt 231.0 lb

## 2023-05-19 DIAGNOSIS — E8770 Fluid overload, unspecified: Secondary | ICD-10-CM | POA: Diagnosis not present

## 2023-05-19 DIAGNOSIS — Z7689 Persons encountering health services in other specified circumstances: Secondary | ICD-10-CM | POA: Diagnosis not present

## 2023-05-19 NOTE — Patient Instructions (Signed)
Visit Information  Ms. [name]  - as a part of your Medicaid benefit, you are eligible for care management and care coordination services at no cost or copay. I was unable to reach you by phone today but would be happy to help you with your health related needs. Please feel free to call me @ [phone].   A member of the Managed Medicaid care management team will reach out to you again over the next 7 days.   Estanislado Emms RN, BSN [place]  [facility] Health RN Care Coordinator [phone]

## 2023-05-19 NOTE — Patient Outreach (Signed)
  Medicaid Managed Care   Unsuccessful Attempt Note   05/19/2023 Name: Stephanie [name] MRN: [id number] DOB: [birth year]  Referred by: Margarita Mail, DO Reason for referral : High Risk Managed Medicaid (Unsuccessful RNCM follow up telephone outreach)   An unsuccessful telephone outreach was attempted today. The patient was referred to the case management team for assistance with care management and care coordination.    Follow Up Plan: A HIPAA compliant phone message was left for the patient providing contact information and requesting a return call. and The Managed Medicaid care management team will reach out to the patient again over the next 7 days.    Estanislado Emms RN, BSN [place]  Value-Based Care [facility] Health RN Care Coordinator [phone]

## 2023-05-19 NOTE — Progress Notes (Signed)
   Acute Office Visit  Subjective:     [name]: Stephanie [name], female    DOB: 1965/09/21, 57 y.o.   MRN: [id number]  Chief Complaint  Patient presents with   Follow-up   Foot Swelling    States it is medication that is causing this     HPI Patient is in today for foot swelling. States it has been worse lately despite taking the Lasix every day. Is worse by the end of the day. Had been so swollen last week that she couldn't fit her feet in her shoes. Has compression stockings, states she is wearing them at home and keeping her feet up. Avoiding sodium and only drinking when thirsty. Denies shortness of breath but cannot lay flat because she feels like she can't breathe, sleeping in recliner.   Review of Systems  Constitutional:  Negative for chills and fever.  Eyes:  Negative for blurred vision.  Respiratory:  Negative for shortness of breath.   Cardiovascular:  Positive for leg swelling. Negative for chest pain.        Objective:    BP 130/86   Pulse 91   Temp 97.9 F (36.6 C)   Ht 5\' 2"  (1.575 m)   Wt 231 lb (104.8 kg)   SpO2 91%   BMI 42.25 kg/m  BP Readings from Last 3 Encounters:  05/19/23 130/86  04/15/23 124/84  02/22/23 124/82   Wt Readings from Last 3 Encounters:  05/19/23 231 lb (104.8 kg)  04/15/23 226 lb (102.5 kg)  02/22/23 180 lb (81.6 kg)      Physical Exam Constitutional:      Appearance: Normal appearance.  HENT:     Head: Normocephalic and atraumatic.  Eyes:     Conjunctiva/sclera: Conjunctivae normal.  Cardiovascular:     Rate and Rhythm: Normal rate and regular rhythm.  Pulmonary:     Effort: Pulmonary effort is normal.     Breath sounds: Normal breath sounds.  Musculoskeletal:     Right lower leg: Edema present.     Left lower leg: Edema present.     Comments: 1+ BLE pitting edema  Skin:    General: Skin is warm and dry.  Neurological:     General: No focal deficit present.     Mental Status: She is alert. Mental status is  at baseline.  Psychiatric:        Mood and Affect: Mood normal.        Behavior: Behavior normal.     No results found for any visits on 05/19/23.      Assessment & Plan:   1. Hypervolemia, unspecified hypervolemia type: Reiterated avoiding sodium, decreasing fluid intake, elevating legs and compression stockings. Will check labs today. Will need to follow up with Cardiology for repeat echo if symptoms continue.   - Basic Metabolic Panel (BMET) - B Nat Peptide   Return in about 2 months (around 07/19/2023) for after 11/6 for a1c.  Margarita Mail, DO

## 2023-05-20 ENCOUNTER — Other Ambulatory Visit: Payer: Self-pay | Admitting: Internal Medicine

## 2023-05-20 DIAGNOSIS — M1990 Unspecified osteoarthritis, unspecified site: Secondary | ICD-10-CM

## 2023-05-20 DIAGNOSIS — E1165 Type 2 diabetes mellitus with hyperglycemia: Secondary | ICD-10-CM

## 2023-05-20 DIAGNOSIS — K219 Gastro-esophageal reflux disease without esophagitis: Secondary | ICD-10-CM

## 2023-05-20 DIAGNOSIS — J439 Emphysema, unspecified: Secondary | ICD-10-CM

## 2023-05-20 LAB — BASIC METABOLIC PANEL
BUN: 11 mg/dL (ref 7–25)
CO2: 27 mmol/L (ref 20–32)
Calcium: 9.4 mg/dL (ref 8.6–10.4)
Chloride: 103 mmol/L (ref 98–110)
Creat: 0.73 mg/dL (ref 0.50–1.03)
Glucose, Bld: 181 mg/dL — ABNORMAL HIGH (ref 65–99)
Potassium: 4.3 mmol/L (ref 3.5–5.3)
Sodium: 142 mmol/L (ref 135–146)

## 2023-05-20 LAB — BRAIN NATRIURETIC PEPTIDE: Brain Natriuretic Peptide: 12 pg/mL (ref <100)

## 2023-05-20 NOTE — Telephone Encounter (Signed)
Medication Refill - Medication: ibuprofen (ADVIL) 800 MG tablet [[id number]] , omeprazole (PRILOSEC) 40 MG capsule [[id number]] , metFORMIN (GLUCOPHAGE-XR) 500 MG 24 hr tablet [[id number]] , albuterol (VENTOLIN HFA) 108 (90 Base) MCG/ACT inhaler [[id number]] , Continuous Glucose Receiver (FREESTYLE LIBRE 2 READER) DEVI [[id number]] , Continuous Glucose Sensor (FREESTYLE LIBRE 2 SENSOR) MISC [[id number]] , fluticasone-salmeterol (ADVAIR) 100-50 MCG/ACT AEPB [[id number]] , Duloxetine 30 MG, Torsemide 40 mg , advair 100/50 disc    Has the patient contacted their pharmacy? Yes.    (Agent: If yes, when and what did the pharmacy advise?) Pharmacy contacted   Preferred Pharmacy (with phone number or street name): [facility] [address] [place] - [address]   Has the patient been seen for an appointment in the last year OR does the patient have an upcoming appointment? Yes.    Agent: Please be advised that RX refills may take up to 3 business days. We ask that you follow-up with your pharmacy.

## 2023-05-20 NOTE — Telephone Encounter (Signed)
Refilled 05/20/23. Requested Prescriptions  Refused Prescriptions Disp Refills   furosemide (LASIX) 40 MG tablet [Pharmacy Med Name: Furosemide 40 MG Oral Tablet] 30 tablet 0    Sig: TAKE 1 TABLET BY MOUTH TWICE DAILY AS NEEDED FOR  EDEMA  IN  THE  MORNING  CAN  TAKE  ADDITIONAL  TABLET  IN  AFTERNOON  IF  NEEDED     Cardiovascular:  Diuretics - Loop Failed - 05/18/2023  9:56 AM      Failed - Mg Level in normal range and within 180 days    Magnesium  Date Value Ref Range Status  11/20/2018 2.1 1.7 - 2.4 mg/dL Final    Comment:    Performed at [facility], [address] [place], [place] [address]         Passed - K in normal range and within 180 days    Potassium  Date Value Ref Range Status  05/19/2023 4.3 3.5 - 5.3 mmol/L Final  03/30/2014 3.7 3.5 - 5.1 mmol/L Final         Passed - Ca in normal range and within 180 days    Calcium  Date Value Ref Range Status  05/19/2023 9.4 8.6 - 10.4 mg/dL Final   Calcium, Total  Date Value Ref Range Status  03/30/2014 9.4 8.5 - 10.1 mg/dL Final   Calcium, Ion  Date Value Ref Range Status  03/07/2018 0.91 (L) 1.15 - 1.40 mmol/L Final         Passed - Na in normal range and within 180 days    Sodium  Date Value Ref Range Status  05/19/2023 142 135 - 146 mmol/L Final  06/27/2016 147 (H) 134 - 144 mmol/L Final  03/30/2014 135 (L) 136 - 145 mmol/L Final         Passed - Cr in normal range and within 180 days    Creat  Date Value Ref Range Status  05/19/2023 0.73 0.50 - 1.03 mg/dL Final   Creatinine, Urine  Date Value Ref Range Status  02/20/2022 87 20 - 275 mg/dL Final         Passed - Cl in normal range and within 180 days    Chloride  Date Value Ref Range Status  05/19/2023 103 98 - 110 mmol/L Final  03/30/2014 105 98 - 107 mmol/L Final         Passed - Last BP in normal range    BP Readings from Last 1 Encounters:  05/19/23 130/86         Passed - Valid encounter within last 6 months    Recent  Outpatient Visits           Yesterday Hypervolemia, unspecified hypervolemia type   [facility]ita Mail, DO   1 month ago Type 2 diabetes mellitus with hyperglycemia, without long-term current use of insulin [facility])   [place] [facility] Margarita Mail, DO   4 months ago Type 2 diabetes mellitus with hyperglycemia, without long-term current use of insulin [facility])   [place] [facility] Margarita Mail, DO   5 months ago Type 2 diabetes mellitus with hyperglycemia, without long-term current use of insulin [facility])   [facility] Margarita Mail, DO   7 months ago Increased urinary frequency   [facility] Margarita Mail, DO       Future Appointments             In  2 months Margarita Mail, DO [place] [facility], [facility]

## 2023-05-20 NOTE — Telephone Encounter (Signed)
Requested Prescriptions  Pending Prescriptions Disp Refills   KLOR-CON M20 20 MEQ tablet [Pharmacy Med Name: Klor-Con M20 20 MEQ Oral Tablet Extended Release] 90 tablet 0    Sig: TAKE 1 TABLET BY MOUTH TWICE DAILY FOR 3 DAYS THEN TAKE 1 TABLET BY MOUTH DAILY WITH LASIX     Endocrinology:  Minerals - Potassium Supplementation Passed - 05/18/2023  9:53 AM      Passed - K in normal range and within 360 days    Potassium  Date Value Ref Range Status  05/19/2023 4.3 3.5 - 5.3 mmol/L Final  03/30/2014 3.7 3.5 - 5.1 mmol/L Final         Passed - Cr in normal range and within 360 days    Creat  Date Value Ref Range Status  05/19/2023 0.73 0.50 - 1.03 mg/dL Final   Creatinine, Urine  Date Value Ref Range Status  02/20/2022 87 20 - 275 mg/dL Final         Passed - Valid encounter within last 12 months    Recent Outpatient Visits           Yesterday Hypervolemia, unspecified hypervolemia type   [facility] Margarita Mail, DO   1 month ago Type 2 diabetes mellitus with hyperglycemia, without long-term current use of insulin [facility])   [place] [facility]ita Mail, DO   4 months ago Type 2 diabetes mellitus with hyperglycemia, without long-term current use of insulin [facility])   [place] [facility] Margarita Mail, DO   5 months ago Type 2 diabetes mellitus with hyperglycemia, without long-term current use of insulin [facility])   [place] [facility] Margarita Mail, DO   7 months ago Increased urinary frequency   [facility] Margarita Mail, DO       Future Appointments             In 2 months Margarita Mail, DO [place] [facility]             furosemide (LASIX) 40 MG tablet [Pharmacy Med Name: Furosemide 40 MG Oral Tablet] 30 tablet 0    Sig: TAKE 1 TABLET BY MOUTH TWICE DAILY AS NEEDED FOR EDEMA IN THE MORNING. CAN TAKE ADDITIONAL  TABLET IN THE AFTERNOON.     Cardiovascular:  Diuretics - Loop Failed - 05/18/2023  9:53 AM      Failed - Mg Level in normal range and within 180 days    Magnesium  Date Value Ref Range Status  11/20/2018 2.1 1.7 - 2.4 mg/dL Final    Comment:    Performed at [facility], [address] [place], [place] [address]         Passed - K in normal range and within 180 days    Potassium  Date Value Ref Range Status  05/19/2023 4.3 3.5 - 5.3 mmol/L Final  03/30/2014 3.7 3.5 - 5.1 mmol/L Final         Passed - Ca in normal range and within 180 days    Calcium  Date Value Ref Range Status  05/19/2023 9.4 8.6 - 10.4 mg/dL Final   Calcium, Total  Date Value Ref Range Status  03/30/2014 9.4 8.5 - 10.1 mg/dL Final   Calcium, Ion  Date Value Ref Range Status  03/07/2018 0.91 (L) 1.15 - 1.40 mmol/L Final         Passed - Na in normal range and within 180 days  Sodium  Date Value Ref Range Status  05/19/2023 142 135 - 146 mmol/L Final  06/27/2016 147 (H) 134 - 144 mmol/L Final  03/30/2014 135 (L) 136 - 145 mmol/L Final         Passed - Cr in normal range and within 180 days    Creat  Date Value Ref Range Status  05/19/2023 0.73 0.50 - 1.03 mg/dL Final   Creatinine, Urine  Date Value Ref Range Status  02/20/2022 87 20 - 275 mg/dL Final         Passed - Cl in normal range and within 180 days    Chloride  Date Value Ref Range Status  05/19/2023 103 98 - 110 mmol/L Final  03/30/2014 105 98 - 107 mmol/L Final         Passed - Last BP in normal range    BP Readings from Last 1 Encounters:  05/19/23 130/86         Passed - Valid encounter within last 6 months    Recent Outpatient Visits           Yesterday Hypervolemia, unspecified hypervolemia type   [facility] Margarita Mail, DO   1 month ago Type 2 diabetes mellitus with hyperglycemia, without long-term current use of insulin [facility])   [facility]  Margarita Mail, DO   4 months ago Type 2 diabetes mellitus with hyperglycemia, without long-term current use of insulin [facility])   [place] [facility] Margarita Mail, DO   5 months ago Type 2 diabetes mellitus with hyperglycemia, without long-term current use of insulin [facility])   [place] [facility] Margarita Mail, DO   7 months ago Increased urinary frequency   [place] [facility] Margarita Mail, DO       Future Appointments             In 2 months Margarita Mail, DO [place] [facility], [facility]            Refused Prescriptions Disp Refills   pioglitazone (ACTOS) 15 MG tablet [Pharmacy Med Name: Pioglitazone HCl 15 MG Oral Tablet] 90 tablet 0    Sig: Take 1 tablet by mouth once daily     Endocrinology:  Diabetes - Glitazones - pioglitazone Passed - 05/18/2023  9:53 AM      Passed - HBA1C is between 0 and 7.9 and within 180 days    Hgb A1c MFr Bld  Date Value Ref Range Status  04/15/2023 7.2 (H) <5.7 % of total Hgb Final    Comment:    For someone without known diabetes, a hemoglobin A1c value of 6.5% or greater indicates that they may have  diabetes and this should be confirmed with a follow-up  test. . For someone with known diabetes, a value <7% indicates  that their diabetes is well controlled and a value  greater than or equal to 7% indicates suboptimal  control. A1c targets should be individualized based on  duration of diabetes, age, comorbid conditions, and  other considerations. . Currently, no consensus exists regarding use of hemoglobin A1c for diagnosis of diabetes for children. Verna Czech - Valid encounter within last 6 months    Recent Outpatient Visits           Yesterday Hypervolemia, unspecified hypervolemia type   [facility] Marg[facility] Mail, DO   1 month ago Type 2  diabetes mellitus with hyperglycemia, without long-term  current use of insulin [facility])   [place] [facility] Margarita Mail, DO   4 months ago Type 2 diabetes mellitus with hyperglycemia, without long-term current use of insulin [facility])   [place] [facility] Margarita Mail, DO   5 months ago Type 2 diabetes mellitus with hyperglycemia, without long-term current use of insulin [facility])   [facility] Margarita Mail, DO   7 months ago Increased urinary frequency   [facility] Margarita Mail, DO       Future Appointments             In 2 months Margarita Mail, DO [facility]

## 2023-05-20 NOTE — Telephone Encounter (Signed)
Patient is no longer on torsemide and duloxetine.

## 2023-05-21 DIAGNOSIS — Z7689 Persons encountering health services in other specified circumstances: Secondary | ICD-10-CM | POA: Diagnosis not present

## 2023-05-21 MED ORDER — METFORMIN HCL ER 500 MG PO TB24: 500.0000 mg | ORAL_TABLET | Freq: Two times a day (BID) | ORAL | 1 refills | Status: DC

## 2023-05-21 MED ORDER — FREESTYLE LIBRE 2 SENSOR MISC
1.0000 | 4 refills | Status: DC
Start: 2023-05-21 — End: 2024-01-02

## 2023-05-21 MED ORDER — FLUTICASONE-SALMETEROL 100-50 MCG/ACT IN AEPB: 1.0000 | INHALATION_SPRAY | Freq: Two times a day (BID) | RESPIRATORY_TRACT | 3 refills | Status: DC

## 2023-05-21 MED ORDER — OMEPRAZOLE 40 MG PO CPDR: 40.0000 mg | DELAYED_RELEASE_CAPSULE | Freq: Every day | ORAL | 1 refills | Status: DC

## 2023-05-21 MED ORDER — FREESTYLE LIBRE 2 READER DEVI: 0 refills | Status: AC

## 2023-05-21 NOTE — Telephone Encounter (Signed)
Requested medication (s) are due for refill today: Yes  Requested medication (s) are on the active medication list: Yes  Last refill:  Ventolin inhaler 02/13/22, Advil 01/15/23  Future visit scheduled: Yes  Notes to clinic:  Unable to refill per protocol, last refill by another provider.      Requested Prescriptions  Pending Prescriptions Disp Refills   ibuprofen (ADVIL) 800 MG tablet 30 tablet 0    Sig: Take 1 tablet (800 mg total) by mouth as needed for moderate pain.     Analgesics:  NSAIDS Failed - 05/21/2023  8:14 AM      Failed - Manual Review: Labs are only required if the patient has taken medication for more than 8 weeks.      Passed - Cr in normal range and within 360 days    Creat  Date Value Ref Range Status  05/19/2023 0.73 0.50 - 1.03 mg/dL Final   Creatinine, Urine  Date Value Ref Range Status  02/20/2022 87 20 - 275 mg/dL Final         Passed - HGB in normal range and within 360 days    Hemoglobin  Date Value Ref Range Status  02/22/2023 13.7 12.0 - 15.0 g/dL Final  35/57/3220 25.4 11.1 - 15.9 g/dL Final         Passed - PLT in normal range and within 360 days    Platelets  Date Value Ref Range Status  02/22/2023 173 150 - 400 K/uL Final  06/27/2016 231 150 - 379 x10E3/uL Final         Passed - HCT in normal range and within 360 days    HCT  Date Value Ref Range Status  02/22/2023 43.6 36.0 - 46.0 % Final   Hematocrit  Date Value Ref Range Status  06/27/2016 44.9 34.0 - 46.6 % Final         Passed - eGFR is 30 or above and within 360 days    EGFR (African American)  Date Value Ref Range Status  03/30/2014 >60  Final   GFR calc Af Amer  Date Value Ref Range Status  04/20/2020 >60 >60 mL/min Final   EGFR (Non-African Amer.)  Date Value Ref Range Status  03/30/2014 >60  Final    Comment:    eGFR values <79mL/min/1.73 m2 may be an indication of chronic kidney disease (CKD). Calculated eGFR is useful in patients with stable renal  function. The eGFR calculation will not be reliable in acutely ill patients when serum creatinine is changing rapidly. It is not useful in  patients on dialysis. The eGFR calculation may not be applicable to patients at the low and high extremes of body sizes, pregnant women, and vegetarians.    GFR, Estimated  Date Value Ref Range Status  02/22/2023 >60 >60 mL/min Final    Comment:    (NOTE) Calculated using the CKD-EPI Creatinine Equation (2021)    eGFR  Date Value Ref Range Status  03/29/2022 83 > OR = 60 mL/min/1.54m2 Final    Comment:    The eGFR is based on the CKD-EPI 2021 equation. To calculate  the new eGFR from a previous Creatinine or Cystatin C result, go to https://www.kidney.org/professionals/ kdoqi/gfr%5Fcalculator          Passed - Patient is not pregnant      Passed - Valid encounter within last 12 months    Recent Outpatient Visits           2 days ago Hypervolemia, unspecified  hypervolemia type   [facility] Marg[facility]ita Mail, DO   1 month ago Type 2 diabetes mellitus with hyperglycemia, without long-term current use of insulin [facility])   [place] [facility] Margarita Mail, DO   4 months ago Type 2 diabetes mellitus with hyperglycemia, without long-term current use of insulin [facility])   [facility] Margarita Mail, DO   5 months ago Type 2 diabetes mellitus with hyperglycemia, without long-term current use of insulin [facility])   [place] [facility] Margarita Mail, DO   7 months ago Increased urinary frequency   [facility] Margarita Mail, DO       Future Appointments             In 2 months Margarita Mail, DO [place] [facility]             albuterol (VENTOLIN HFA) 108 (90 Base) MCG/ACT inhaler 8 g 0    Sig: Inhale 2 puffs into the lungs every 6 (six) hours as needed for wheezing or  shortness of breath.     Pulmonology:  Beta Agonists 2 Passed - 05/21/2023  8:14 AM      Passed - Last BP in normal range    BP Readings from Last 1 Encounters:  05/19/23 130/86         Passed - Last Heart Rate in normal range    Pulse Readings from Last 1 Encounters:  05/19/23 91         Passed - Valid encounter within last 12 months    Recent Outpatient Visits           2 days ago Hypervolemia, unspecified hypervolemia type   [facility]   1 month ago Type 2 diabetes mellitus with hyperglycemia, without long-term current use of insulin [facility])   [place] [facility] Marg[facility]ita Mail, DO   4 months ago Type 2 diabetes mellitus with hyperglycemia, without long-term current use of insulin [facility])   [place] [facility] Margarita Mail, DO   5 months ago Type 2 diabetes mellitus with hyperglycemia, without long-term current use of insulin [facility])   [place] [facility] Margarita Mail, DO   7 months ago Increased urinary frequency   [facility] Marg[facility]ita Mail, DO       Future Appointments             In 2 months Margarita Mail, DO [place] [facility], [facility]            Signed Prescriptions Disp Refills   omeprazole (PRILOSEC) 40 MG capsule 90 capsule 1    Sig: Take 1 capsule (40 mg total) by mouth daily.     Gastroenterology: Proton Pump Inhibitors Passed - 05/21/2023  8:14 AM      Passed - Valid encounter within last 12 months    Recent Outpatient Visits           2 days ago Hypervolemia, unspecified hypervolemia type   [facility] Mail, DO   1 month ago Type 2 diabetes mellitus with hyperglycemia, without long-term current use of insulin [facility])   [place] Mail, DO   4 months ago Type 2 diabetes mellitus with hyperglycemia,  without long-term current use of insulin [facility])   [facility] Margarita Mail, [place]   5  months ago Type 2 diabetes mellitus with hyperglycemia, without long-term current use of insulin [facility])   [place] [facility] Margarita Mail, DO   7 months ago Increased urinary frequency   [facility], DO       Future Appointments             In 2 months Margarita Mail, DO [place] [facility]             metFORMIN (GLUCOPHAGE-XR) 500 MG 24 hr tablet 180 tablet 1    Sig: Take 1 tablet (500 mg total) by mouth 2 (two) times daily with a meal.     Endocrinology:  Diabetes - Biguanides Passed - 05/21/2023  8:14 AM      Passed - Cr in normal range and within 360 days    Creat  Date Value Ref Range Status  05/19/2023 0.73 0.50 - 1.03 mg/dL Final   Creatinine, Urine  Date Value Ref Range Status  02/20/2022 87 20 - 275 mg/dL Final         Passed - HBA1C is between 0 and 7.9 and within 180 days    Hgb A1c MFr Bld  Date Value Ref Range Status  04/15/2023 7.2 (H) <5.7 % of total Hgb Final    Comment:    For someone without known diabetes, a hemoglobin A1c value of 6.5% or greater indicates that they may have  diabetes and this should be confirmed with a follow-up  test. . For someone with known diabetes, a value <7% indicates  that their diabetes is well controlled and a value  greater than or equal to 7% indicates suboptimal  control. A1c targets should be individualized based on  duration of diabetes, age, comorbid conditions, and  other considerations. . Currently, no consensus exists regarding use of hemoglobin A1c for diagnosis of diabetes for children. .          Passed - eGFR in normal range and within 360 days    EGFR (African American)  Date Value Ref Range Status  03/30/2014 >60  Final   GFR calc Af Amer  Date Value Ref Range Status  04/20/2020 >60  >60 mL/min Final   EGFR (Non-African Amer.)  Date Value Ref Range Status  03/30/2014 >60  Final    Comment:    eGFR values <80mL/min/1.73 m2 may be an indication of chronic kidney disease (CKD). Calculated eGFR is useful in patients with stable renal function. The eGFR calculation will not be reliable in acutely ill patients when serum creatinine is changing rapidly. It is not useful in  patients on dialysis. The eGFR calculation may not be applicable to patients at the low and high extremes of body sizes, pregnant women, and vegetarians.    GFR, Estimated  Date Value Ref Range Status  02/22/2023 >60 >60 mL/min Final    Comment:    (NOTE) Calculated using the CKD-EPI Creatinine Equation (2021)    eGFR  Date Value Ref Range Status  03/29/2022 83 > OR = 60 mL/min/1.3m2 Final    Comment:    The eGFR is based on the CKD-EPI 2021 equation. To calculate  the new eGFR from a previous Creatinine or Cystatin C result, go to https://www.kidney.org/professionals/ kdoqi/gfr%5Fcalculator          Passed - B12 Level in normal range and within 720 days    Vitamin B-12  Date Value Ref Range Status  02/20/2022 380 200 - 1,100 pg/mL  Final    Comment:    . Please Note: Although the reference range for vitamin B12 is [phone] pg/mL, it has been reported that between 5 and 10% of patients with values between 200 and 400 pg/mL may experience neuropsychiatric and hematologic abnormalities due to occult B12 deficiency; less than 1% of patients with values above 400 pg/mL will have symptoms. Verna Czech - Valid encounter within last 6 months    Recent Outpatient Visits           2 days ago Hypervolemia, unspecified hypervolemia type   [facility], [facility]   1 month ago Type 2 diabetes mellitus with hyperglycemia, without long-term current use of insulin [facility])   [place] [facility] Margarita Mail, DO   4 months  ago Type 2 diabetes mellitus with hyperglycemia, without long-term current use of insulin [facility])   [place] [facility] Margarita Mail, DO   5 months ago Type 2 diabetes mellitus with hyperglycemia, without long-term current use of insulin [facility])   [place] [facility] Margarita Mail, DO   7 months ago Increased urinary frequency   [facility] Margarita Mail, DO       Future Appointments             In 2 months Margarita Mail, DO [place] [facility]            Passed - CBC within normal limits and completed in the last 12 months    WBC  Date Value Ref Range Status  02/22/2023 5.1 4.0 - 10.5 K/uL Final   RBC  Date Value Ref Range Status  02/22/2023 4.43 3.87 - 5.11 MIL/uL Final   Hemoglobin  Date Value Ref Range Status  02/22/2023 13.7 12.0 - 15.0 g/dL Final  09/11/7251 66.4 11.1 - 15.9 g/dL Final   HCT  Date Value Ref Range Status  02/22/2023 43.6 36.0 - 46.0 % Final   Hematocrit  Date Value Ref Range Status  06/27/2016 44.9 34.0 - 46.6 % Final   MCHC  Date Value Ref Range Status  02/22/2023 31.4 30.0 - 36.0 g/dL Final   [facility]  Date Value Ref Range Status  02/22/2023 30.9 26.0 - 34.0 pg Final   MCV  Date Value Ref Range Status  02/22/2023 98.4 80.0 - 100.0 fL Final  06/27/2016 95 79 - 97 fL Final  03/30/2014 94 80 - 100 fL Final   No results found for: "PLTCOUNTKUC", "LABPLAT", "POCPLA" RDW  Date Value Ref Range Status  02/22/2023 13.6 11.5 - 15.5 % Final  06/27/2016 13.9 12.3 - 15.4 % Final  03/30/2014 13.8 11.5 - 14.5 % Final          Continuous Glucose Receiver (FREESTYLE LIBRE 2 READER) DEVI 1 each 0    Sig: Patient lost her's     Endocrinology: Diabetes - Testing Supplies Passed - 05/21/2023  8:14 AM      Passed - Valid encounter within last 12 months    Recent Outpatient Visits           2 days ago Hypervolemia, unspecified hypervolemia type    [facility]   1 month ago Type 2 diabetes mellitus with hyperglycemia, without long-term current use of insulin [facility])    [facility]   4 months ago Type 2 diabetes mellitus  with hyperglycemia, without long-term current use of insulin [facility])   [place] [facility] Margarita Mail, DO   5 months ago Type 2 diabetes mellitus with hyperglycemia, without long-term current use of insulin [facility])   [place] [facility] Margarita Mail, DO   7 months ago Increased urinary frequency   [facility] Mail, DO       Future Appointments             In 2 months Margarita Mail, DO [place] [facility]             Continuous Glucose Sensor (FREESTYLE LIBRE 2 SENSOR) MISC 4 each 4    Sig: 1 each by Does not apply route every 14 (fourteen) days.     Endocrinology: Diabetes - Testing Supplies Passed - 05/21/2023  8:14 AM      Passed - Valid encounter within last 12 months    Recent Outpatient Visits           2 days ago Hypervolemia, unspecified hypervolemia type   [facility]   1 month ago Type 2 diabetes mellitus with hyperglycemia, without long-term current use of insulin [facility])   [place] [facility] Margarita Mail, DO   4 months ago Type 2 diabetes mellitus with hyperglycemia, without long-term current use of insulin [facility])   [place] [facility] Margarita Mail, DO   5 months ago Type 2 diabetes mellitus with hyperglycemia, without long-term current use of insulin [facility])   [place] [facility] Margarita Mail, DO   7 months ago Increased urinary frequency   [facility] Margarita Mail, DO       Future Appointments             In 2 months  Margarita Mail, DO [place] [facility]             fluticasone-salmeterol (ADVAIR) 100-50 MCG/ACT AEPB 1 each 3    Sig: Inhale 1 puff into the lungs 2 (two) times daily.     Pulmonology:  Combination Products Passed - 05/21/2023  8:14 AM      Passed - Valid encounter within last 12 months    Recent Outpatient Visits           2 days ago Hypervolemia, unspecified hypervolemia type   [facility] Margarita Mail, DO   1 month ago Type 2 diabetes mellitus with hyperglycemia, without long-term current use of insulin [facility])   [place] [facility] Margarita Mail, DO   4 months ago Type 2 diabetes mellitus with hyperglycemia, without long-term current use of insulin [facility])   [place] [facility] Margarita Mail, DO   5 months ago Type 2 diabetes mellitus with hyperglycemia, without long-term current use of insulin [facility])   [facility] Margarita Mail, DO   7 months ago Increased urinary frequency   [facility] Marg[facility] Mail, DO       Future Appointments             In 2 months Margarita Mail, DO [facility] [place], [facility]

## 2023-05-21 NOTE — Telephone Encounter (Signed)
Requested Prescriptions  Pending Prescriptions Disp Refills   omeprazole (PRILOSEC) 40 MG capsule 90 capsule 1    Sig: Take 1 capsule (40 mg total) by mouth daily.     Gastroenterology: Proton Pump Inhibitors Passed - 05/21/2023  8:14 AM      Passed - Valid encounter within last 12 months    Recent Outpatient Visits           2 days ago Hypervolemia, unspecified hypervolemia type   [facility] Margarita Mail, DO   1 month ago Type 2 diabetes mellitus with hyperglycemia, without long-term current use of insulin [facility])   [place] [facility] Marg[facility]ita Mail, DO   4 months ago Type 2 diabetes mellitus with hyperglycemia, without long-term current use of insulin [facility])   [place] [facility] Margarita Mail, DO   5 months ago Type 2 diabetes mellitus with hyperglycemia, without long-term current use of insulin [facility])   [place] [facility] Margarita Mail, DO   7 months ago Increased urinary frequency   [facility] Margarita Mail, DO       Future Appointments             In 2 months Margarita Mail, DO [place] [facility]             ibuprofen (ADVIL) 800 MG tablet 30 tablet 0    Sig: Take 1 tablet (800 mg total) by mouth as needed for moderate pain.     Analgesics:  NSAIDS Failed - 05/21/2023  8:14 AM      Failed - Manual Review: Labs are only required if the patient has taken medication for more than 8 weeks.      Passed - Cr in normal range and within 360 days    Creat  Date Value Ref Range Status  05/19/2023 0.73 0.50 - 1.03 mg/dL Final   Creatinine, Urine  Date Value Ref Range Status  02/20/2022 87 20 - 275 mg/dL Final         Passed - HGB in normal range and within 360 days    Hemoglobin  Date Value Ref Range Status  02/22/2023 13.7 12.0 - 15.0 g/dL Final  09/81/1914 78.2 11.1 - 15.9 g/dL Final         Passed -  PLT in normal range and within 360 days    Platelets  Date Value Ref Range Status  02/22/2023 173 150 - 400 K/uL Final  06/27/2016 231 150 - 379 x10E3/uL Final         Passed - HCT in normal range and within 360 days    HCT  Date Value Ref Range Status  02/22/2023 43.6 36.0 - 46.0 % Final   Hematocrit  Date Value Ref Range Status  06/27/2016 44.9 34.0 - 46.6 % Final         Passed - eGFR is 30 or above and within 360 days    EGFR (African American)  Date Value Ref Range Status  03/30/2014 >60  Final   GFR calc Af Amer  Date Value Ref Range Status  04/20/2020 >60 >60 mL/min Final   EGFR (Non-African Amer.)  Date Value Ref Range Status  03/30/2014 >60  Final    Comment:    eGFR values <24mL/min/1.73 m2 may be an indication of chronic kidney disease (CKD). Calculated eGFR is useful in patients with stable renal function. The eGFR calculation will not be reliable in acutely  ill patients when serum creatinine is changing rapidly. It is not useful in  patients on dialysis. The eGFR calculation may not be applicable to patients at the low and high extremes of body sizes, pregnant women, and vegetarians.    GFR, Estimated  Date Value Ref Range Status  02/22/2023 >60 >60 mL/min Final    Comment:    (NOTE) Calculated using the CKD-EPI Creatinine Equation (2021)    eGFR  Date Value Ref Range Status  03/29/2022 83 > OR = 60 mL/min/1.93m2 Final    Comment:    The eGFR is based on the CKD-EPI 2021 equation. To calculate  the new eGFR from a previous Creatinine or Cystatin C result, go to https://www.kidney.org/professionals/ kdoqi/gfr%5Fcalculator          Passed - Patient is not pregnant      Passed - Valid encounter within last 12 months    Recent Outpatient Visits           2 days ago Hypervolemia, unspecified hypervolemia type   [facility], DO   1 month ago Type 2 diabetes mellitus with hyperglycemia, without  long-term current use of insulin [facility])   [place] [facility] Mail, DO   4 months ago Type 2 diabetes mellitus with hyperglycemia, without long-term current use of insulin [facility])   [place] [facility] Margarita Mail, DO   5 months ago Type 2 diabetes mellitus with hyperglycemia, without long-term current use of insulin [facility])   [place] [facility] Margarita Mail, DO   7 months ago Increased urinary frequency   [place] [facility] Margarita Mail, DO       Future Appointments             In 2 months Margarita Mail, DO [facility]             metFORMIN (GLUCOPHAGE-XR) 500 MG 24 hr tablet 180 tablet 1    Sig: Take 1 tablet (500 mg total) by mouth 2 (two) times daily with a meal.     Endocrinology:  Diabetes - Biguanides Passed - 05/21/2023  8:14 AM      Passed - Cr in normal range and within 360 days    Creat  Date Value Ref Range Status  05/19/2023 0.73 0.50 - 1.03 mg/dL Final   Creatinine, Urine  Date Value Ref Range Status  02/20/2022 87 20 - 275 mg/dL Final         Passed - HBA1C is between 0 and 7.9 and within 180 days    Hgb A1c MFr Bld  Date Value Ref Range Status  04/15/2023 7.2 (H) <5.7 % of total Hgb Final    Comment:    For someone without known diabetes, a hemoglobin A1c value of 6.5% or greater indicates that they may have  diabetes and this should be confirmed with a follow-up  test. . For someone with known diabetes, a value <7% indicates  that their diabetes is well controlled and a value  greater than or equal to 7% indicates suboptimal  control. A1c targets should be individualized based on  duration of diabetes, age, comorbid conditions, and  other considerations. . Currently, no consensus exists regarding use of hemoglobin A1c for diagnosis of diabetes for children. .          Passed - eGFR in normal range and  within 360 days    EGFR (African American)  Date Value Ref Range Status  03/30/2014 >60  Final   GFR calc Af Amer  Date Value Ref Range Status  04/20/2020 >60 >60 mL/min Final   EGFR (Non-African Amer.)  Date Value Ref Range Status  03/30/2014 >60  Final    Comment:    eGFR values <24mL/min/1.73 m2 may be an indication of chronic kidney disease (CKD). Calculated eGFR is useful in patients with stable renal function. The eGFR calculation will not be reliable in acutely ill patients when serum creatinine is changing rapidly. It is not useful in  patients on dialysis. The eGFR calculation may not be applicable to patients at the low and high extremes of body sizes, pregnant women, and vegetarians.    GFR, Estimated  Date Value Ref Range Status  02/22/2023 >60 >60 mL/min Final    Comment:    (NOTE) Calculated using the CKD-EPI Creatinine Equation (2021)    eGFR  Date Value Ref Range Status  03/29/2022 83 > OR = 60 mL/min/1.57m2 Final    Comment:    The eGFR is based on the CKD-EPI 2021 equation. To calculate  the new eGFR from a previous Creatinine or Cystatin C result, go to https://www.kidney.org/professionals/ kdoqi/gfr%5Fcalculator          Passed - B12 Level in normal range and within 720 days    Vitamin B-12  Date Value Ref Range Status  02/20/2022 380 200 - 1,100 pg/mL Final    Comment:    . Please Note: Although the reference range for vitamin B12 is [phone] pg/mL, it has been reported that between 5 and 10% of patients with values between 200 and 400 pg/mL may experience neuropsychiatric and hematologic abnormalities due to occult B12 deficiency; less than 1% of patients with values above 400 pg/mL will have symptoms. Verna Czech - Valid encounter within last 6 months    Recent Outpatient Visits           2 days ago Hypervolemia, unspecified hypervolemia type   [facility], DO   1 month ago Type  2 diabetes mellitus with hyperglycemia, without long-term current use of insulin [facility])   [place] [facility] Marg[facility] Mail, DO   4 months ago Type 2 diabetes mellitus with hyperglycemia, without long-term current use of insulin [facility])   [place] [facility] Margar[facility] Mail, DO   5 months ago Type 2 diabetes mellitus with hyperglycemia, without long-term current use of insulin [facility])   [place] [facility] Margarita Mail, DO   7 months ago Increased urinary frequency   [facility] Margarita Mail, DO       Future Appointments             In 2 months Margarita Mail, DO [place] [facility]            Passed - CBC within normal limits and completed in the last 12 months    WBC  Date Value Ref Range Status  02/22/2023 5.1 4.0 - 10.5 K/uL Final   RBC  Date Value Ref Range Status  02/22/2023 4.43 3.87 - 5.11 MIL/uL Final   Hemoglobin  Date Value Ref Range Status  02/22/2023 13.7 12.0 - 15.0 g/dL Final  88/41/6606 30.1 11.1 - 15.9 g/dL Final   HCT  Date Value Ref Range Status  02/22/2023 43.6 36.0 - 46.0 % Final   Hematocrit  Date  Value Ref Range Status  06/27/2016 44.9 34.0 - 46.6 % Final   MCHC  Date Value Ref Range Status  02/22/2023 31.4 30.0 - 36.0 g/dL Final   [facility]  Date Value Ref Range Status  02/22/2023 30.9 26.0 - 34.0 pg Final   MCV  Date Value Ref Range Status  02/22/2023 98.4 80.0 - 100.0 fL Final  06/27/2016 95 79 - 97 fL Final  03/30/2014 94 80 - 100 fL Final   No results found for: "PLTCOUNTKUC", "LABPLAT", "POCPLA" RDW  Date Value Ref Range Status  02/22/2023 13.6 11.5 - 15.5 % Final  06/27/2016 13.9 12.3 - 15.4 % Final  03/30/2014 13.8 11.5 - 14.5 % Final          albuterol (VENTOLIN HFA) 108 (90 Base) MCG/ACT inhaler 8 g 0    Sig: Inhale 2 puffs into the lungs every 6 (six) hours as needed for wheezing or shortness of  breath.     Pulmonology:  Beta Agonists 2 Passed - 05/21/2023  8:14 AM      Passed - Last BP in normal range    BP Readings from Last 1 Encounters:  05/19/23 130/86         Passed - Last Heart Rate in normal range    Pulse Readings from Last 1 Encounters:  05/19/23 91         Passed - Valid encounter within last 12 months    Recent Outpatient Visits           2 days ago Hypervolemia, unspecified hypervolemia type   [facility]   1 month ago Type 2 diabetes mellitus with hyperglycemia, without long-term current use of insulin [facility])   [place] [facility] Mail, DO   4 months ago Type 2 diabetes mellitus with hyperglycemia, without long-term current use of insulin [facility])   [place] [facility] Mail, DO   5 months ago Type 2 diabetes mellitus with hyperglycemia, without long-term current use of insulin [facility])   [place] [facility] Marg[facility]ita Mail, DO   7 months ago Increased urinary frequency   [facility] Margarita Mail, DO       Future Appointments             In 2 months Margarita Mail, DO [facility]             Continuous Glucose Receiver (FREESTYLE LIBRE 2 READER) DEVI 1 each 0    Sig: Patient lost her's     Endocrinology: Diabetes - Testing Supplies Passed - 05/21/2023  8:14 AM      Passed - Valid encounter within last 12 months    Recent Outpatient Visits           2 days ago Hypervolemia, unspecified hypervolemia type   [facility], DO   1 month ago Type 2 diabetes mellitus with hyperglycemia, without long-term current use of insulin [facility])   [place] [facility], DO   4 months ago Type 2 diabetes mellitus with hyperglycemia, without long-term current use of insulin [facility])   [facility]ita Mail, DO   5 months ago Type 2 diabetes mellitus with hyperglycemia, without long-term current use of insulin [facility])   [place] [facility] Margarita Mail, DO   7 months ago Increased urinary frequency   [place]  [facility] Margarita Mail, DO       Future Appointments             In 2 months Margarita Mail, DO [place] [facility]             Continuous Glucose Sensor (FREESTYLE LIBRE 2 SENSOR) MISC 4 each 4    Sig: 1 each by Does not apply route every 14 (fourteen) days.     Endocrinology: Diabetes - Testing Supplies Passed - 05/21/2023  8:14 AM      Passed - Valid encounter within last 12 months    Recent Outpatient Visits           2 days ago Hypervolemia, unspecified hypervolemia type   [facility], DO   1 month ago Type 2 diabetes mellitus with hyperglycemia, without long-term current use of insulin [facility])   [place] [facility] Margarita Mail, DO   4 months ago Type 2 diabetes mellitus with hyperglycemia, without long-term current use of insulin [facility])   [place] [facility] Marg[facility]ita Mail, DO   5 months ago Type 2 diabetes mellitus with hyperglycemia, without long-term current use of insulin [facility])   [place] [facility] Margarita Mail, DO   7 months ago Increased urinary frequency   [facility], DO       Future Appointments             In 2 months Margarita Mail, DO [place] [facility]             fluticasone-salmeterol (ADVAIR) 100-50 MCG/ACT AEPB 1 each 3    Sig: Inhale 1 puff into the lungs 2 (two) times daily.     Pulmonology:  Combination Products Passed - 05/21/2023  8:14 AM      Passed - Valid encounter within last 12 months    Recent Outpatient Visits            2 days ago Hypervolemia, unspecified hypervolemia type   [facility] Mail, DO   1 month ago Type 2 diabetes mellitus with hyperglycemia, without long-term current use of insulin [facility])   [place] [facility] Marg[facility] Mail, DO   4 months ago Type 2 diabetes mellitus with hyperglycemia, without long-term current use of insulin [facility])   [place] [facility] Margarita Mail, DO   5 months ago Type 2 diabetes mellitus with hyperglycemia, without long-term current use of insulin [facility])   [facility] Marg[facility]ita Mail, DO   7 months ago Increased urinary frequency   [place] [facility] Margarita Mail, DO       Future Appointments             In 2 months Margarita Mail, DO [facility]

## 2023-05-22 ENCOUNTER — Telehealth: Payer: Self-pay | Admitting: Internal Medicine

## 2023-05-22 NOTE — Telephone Encounter (Signed)
error 

## 2023-05-23 ENCOUNTER — Telehealth: Payer: Self-pay | Admitting: Internal Medicine

## 2023-05-23 DIAGNOSIS — G4733 Obstructive sleep apnea (adult) (pediatric): Secondary | ICD-10-CM | POA: Diagnosis not present

## 2023-05-23 DIAGNOSIS — Z8673 Personal history of transient ischemic attack (TIA), and cerebral infarction without residual deficits: Secondary | ICD-10-CM | POA: Diagnosis not present

## 2023-05-23 DIAGNOSIS — I10 Essential (primary) hypertension: Secondary | ICD-10-CM | POA: Diagnosis not present

## 2023-05-23 DIAGNOSIS — R001 Bradycardia, unspecified: Secondary | ICD-10-CM | POA: Diagnosis not present

## 2023-05-23 DIAGNOSIS — E119 Type 2 diabetes mellitus without complications: Secondary | ICD-10-CM | POA: Diagnosis not present

## 2023-05-23 DIAGNOSIS — E669 Obesity, unspecified: Secondary | ICD-10-CM | POA: Diagnosis not present

## 2023-05-23 DIAGNOSIS — R0602 Shortness of breath: Secondary | ICD-10-CM | POA: Diagnosis not present

## 2023-05-23 DIAGNOSIS — R079 Chest pain, unspecified: Secondary | ICD-10-CM | POA: Diagnosis not present

## 2023-05-23 DIAGNOSIS — E7800 Pure hypercholesterolemia, unspecified: Secondary | ICD-10-CM | POA: Diagnosis not present

## 2023-05-23 MED ORDER — ALBUTEROL SULFATE HFA 108 (90 BASE) MCG/ACT IN AERS: 2.0000 | INHALATION_SPRAY | Freq: Four times a day (QID) | RESPIRATORY_TRACT | 0 refills | Status: DC | PRN

## 2023-05-23 NOTE — Telephone Encounter (Signed)
Pt is calling in requesting to speak with a nurse regarding her prescription. Please follow up with pt.

## 2023-05-26 ENCOUNTER — Other Ambulatory Visit: Payer: Self-pay | Admitting: Internal Medicine

## 2023-05-26 ENCOUNTER — Telehealth: Payer: Self-pay | Admitting: Internal Medicine

## 2023-05-26 DIAGNOSIS — E1165 Type 2 diabetes mellitus with hyperglycemia: Secondary | ICD-10-CM

## 2023-05-26 NOTE — Telephone Encounter (Signed)
Copied from CRM [id number]. Topic: General - Other >> May 26, 2023  9:42 AM Phill Myron wrote: Edson Snowball from [facility] will be refaxing an RX request list.

## 2023-05-27 ENCOUNTER — Ambulatory Visit: Payer: Medicaid Other | Admitting: Internal Medicine

## 2023-05-27 NOTE — Telephone Encounter (Signed)
Requested medication (s) are due for refill today: na   Requested medication (s) are on the active medication list: yes   Last refill:  libre sensor- 05/21/23 # 4/each 4 refills , ventolin - 05/23/23 # 8 g  0 refills   Future visit scheduled: yes in 1 month  Notes to clinic:  pharmacy requesting 10 refills. Do you want to refill Rx 10 refills?     Requested Prescriptions  Pending Prescriptions Disp Refills   Continuous Glucose Sensor (FREESTYLE LIBRE 2 SENSOR) MISC [Pharmacy Med Name: FREESTYLE LIBRE 2 SENSOR Miscellaneous] 2 each 10    Sig: USE TO MONITOR BLOOD SUGAR. CHANGE SENSOR EVERY 14 DAYS.     Endocrinology: Diabetes - Testing Supplies Passed - 05/26/2023  7:24 AM      Passed - Valid encounter within last 12 months    Recent Outpatient Visits           1 week ago Hypervolemia, unspecified hypervolemia type   [facility], DO   1 month ago Type 2 diabetes mellitus with hyperglycemia, without long-term current use of insulin [facility])   [place] [facility] Margar[facility] Mail, DO   4 months ago Type 2 diabetes mellitus with hyperglycemia, without long-term current use of insulin [facility])   [place] [facility] Margarita Mail, DO   5 months ago Type 2 diabetes mellitus with hyperglycemia, without long-term current use of insulin [facility])   [place] [facility] Margarita Mail, DO   7 months ago Increased urinary frequency   [place] [facility] Margarita Mail, DO       Future Appointments             In 1 month Margarita Mail, DO [place] [facility]             VENTOLIN HFA 108 (90 Base) MCG/ACT inhaler [Pharmacy Med Name: VENTOLIN HFA INH X1 108 (90 BAS Aerosol] 18 g 10    Sig: INHALE 2 PUFFS BY MOUTH EVERY 6 HOURS AS NEEDED FOR WHEEZING OR SHORTNESS OF BREATH     Pulmonology:  Beta Agonists 2 Passed - 05/26/2023   7:24 AM      Passed - Last BP in normal range    BP Readings from Last 1 Encounters:  05/19/23 130/86         Passed - Last Heart Rate in normal range    Pulse Readings from Last 1 Encounters:  05/19/23 91         Passed - Valid encounter within last 12 months    Recent Outpatient Visits           1 week ago Hypervolemia, unspecified hypervolemia type   [facility]   1 month ago Type 2 diabetes mellitus with hyperglycemia, without long-term current use of insulin [facility])   [place] [facility] Mail, DO   4 months ago Type 2 diabetes mellitus with hyperglycemia, without long-term current use of insulin [facility])    [facility] Margarita Mail, DO   5 months ago Type 2 diabetes mellitus with hyperglycemia, without long-term current use of insulin [facility])   [place] [facility] Marg[facility] Mail, DO   7 months ago Increased urinary frequency   [facility] Margarita Mail, DO       Future Appointments  In 1 month Margarita Mail, DO [place] [facility]

## 2023-05-28 ENCOUNTER — Other Ambulatory Visit: Payer: Self-pay | Admitting: Internal Medicine

## 2023-05-28 DIAGNOSIS — M1990 Unspecified osteoarthritis, unspecified site: Secondary | ICD-10-CM

## 2023-05-28 DIAGNOSIS — M7989 Other specified soft tissue disorders: Secondary | ICD-10-CM

## 2023-05-28 NOTE — Telephone Encounter (Signed)
Medication Refill - Medication:  DULoxetine (CYMBALTA) 30 MG capsule   furosemide (LASIX) 40 MG tablet  ibuprofen (ADVIL) 800 MG tablet  clopidogrel (PLAVIX) 75 MG tablet   Has the patient contacted their pharmacy? Yes, Devonne Doughty with [facility] has called to request these refills  Preferred Pharmacy (with phone number or street name):  [facility] [address] [place] - [address]  Phone: [phone] Fax: [phone]    Has the patient been seen for an appointment in the last year OR does the patient have an upcoming appointment? Yes. F/U on 07/23/2023

## 2023-05-28 NOTE — Telephone Encounter (Signed)
Patient is not taking duloxetine per medication list history, removed on 12/04/17.

## 2023-05-29 ENCOUNTER — Emergency Department (HOSPITAL_COMMUNITY): Payer: Medicaid Other

## 2023-05-29 ENCOUNTER — Other Ambulatory Visit: Payer: Self-pay | Admitting: Internal Medicine

## 2023-05-29 ENCOUNTER — Ambulatory Visit: Payer: Self-pay

## 2023-05-29 ENCOUNTER — Other Ambulatory Visit: Payer: Medicaid Other | Admitting: *Deleted

## 2023-05-29 ENCOUNTER — Emergency Department (HOSPITAL_COMMUNITY)
Admission: EM | Admit: 2023-05-29 | Discharge: 2023-05-30 | Disposition: A | Discharge status: Home / Self Care | Payer: Medicaid Other | Attending: Emergency Medicine | Admitting: Emergency Medicine

## 2023-05-29 DIAGNOSIS — Z7902 Long term (current) use of antithrombotics/antiplatelets: Secondary | ICD-10-CM | POA: Diagnosis not present

## 2023-05-29 DIAGNOSIS — E1165 Type 2 diabetes mellitus with hyperglycemia: Secondary | ICD-10-CM | POA: Diagnosis not present

## 2023-05-29 DIAGNOSIS — R109 Unspecified abdominal pain: Secondary | ICD-10-CM | POA: Diagnosis not present

## 2023-05-29 DIAGNOSIS — K5730 Diverticulosis of large intestine without perforation or abscess without bleeding: Secondary | ICD-10-CM | POA: Diagnosis not present

## 2023-05-29 DIAGNOSIS — Z8673 Personal history of transient ischemic attack (TIA), and cerebral infarction without residual deficits: Secondary | ICD-10-CM | POA: Insufficient documentation

## 2023-05-29 DIAGNOSIS — I10 Essential (primary) hypertension: Secondary | ICD-10-CM | POA: Diagnosis not present

## 2023-05-29 DIAGNOSIS — Z79899 Other long term (current) drug therapy: Secondary | ICD-10-CM | POA: Diagnosis not present

## 2023-05-29 DIAGNOSIS — Z7984 Long term (current) use of oral hypoglycemic drugs: Secondary | ICD-10-CM | POA: Diagnosis not present

## 2023-05-29 DIAGNOSIS — R1084 Generalized abdominal pain: Secondary | ICD-10-CM | POA: Insufficient documentation

## 2023-05-29 DIAGNOSIS — R072 Precordial pain: Secondary | ICD-10-CM | POA: Insufficient documentation

## 2023-05-29 DIAGNOSIS — R079 Chest pain, unspecified: Secondary | ICD-10-CM | POA: Diagnosis not present

## 2023-05-29 DIAGNOSIS — I517 Cardiomegaly: Secondary | ICD-10-CM | POA: Diagnosis not present

## 2023-05-29 DIAGNOSIS — N200 Calculus of kidney: Secondary | ICD-10-CM | POA: Diagnosis not present

## 2023-05-29 DIAGNOSIS — R519 Headache, unspecified: Secondary | ICD-10-CM | POA: Diagnosis not present

## 2023-05-29 LAB — COMPREHENSIVE METABOLIC PANEL
ALT: 34 U/L (ref 0–44)
AST: 27 U/L (ref 15–41)
Albumin: 4.2 g/dL (ref 3.5–5.0)
Alkaline Phosphatase: 98 U/L (ref 38–126)
Anion gap: 12 (ref 5–15)
BUN: 12 mg/dL (ref 6–20)
CO2: 26 mmol/L (ref 22–32)
Calcium: 9.1 mg/dL (ref 8.9–10.3)
Chloride: 96 mmol/L — ABNORMAL LOW (ref 98–111)
Creatinine, Ser: 0.74 mg/dL (ref 0.44–1.00)
GFR, Estimated: >60 mL/min (ref >60)
Glucose, Bld: 166 mg/dL — ABNORMAL HIGH (ref 70–99)
Potassium: 3.4 mmol/L — ABNORMAL LOW (ref 3.5–5.1)
Sodium: 134 mmol/L — ABNORMAL LOW (ref 135–145)
Total Bilirubin: 0.4 mg/dL (ref 0.3–1.2)
Total Protein: 8 g/dL (ref 6.5–8.1)

## 2023-05-29 LAB — URINALYSIS, ROUTINE W REFLEX MICROSCOPIC
Bilirubin Urine: NEGATIVE
Glucose, UA: 150 mg/dL — AB
Ketones, ur: NEGATIVE mg/dL
Nitrite: NEGATIVE
Protein, ur: NEGATIVE mg/dL
Specific Gravity, Urine: 1.009 (ref 1.005–1.030)
WBC, UA: >50 WBC/hpf (ref 0–5)
pH: 6 (ref 5.0–8.0)

## 2023-05-29 LAB — CBC
HCT: 44.3 % (ref 36.0–46.0)
Hemoglobin: 13.9 g/dL (ref 12.0–15.0)
MCH: 30 pg (ref 26.0–34.0)
MCHC: 31.4 g/dL (ref 30.0–36.0)
MCV: 95.5 fL (ref 80.0–100.0)
Platelets: 181 10*3/uL (ref 150–400)
RBC: 4.64 MIL/uL (ref 3.87–5.11)
RDW: 13.6 % (ref 11.5–15.5)
WBC: 6.3 10*3/uL (ref 4.0–10.5)
nRBC: 0 % (ref 0.0–0.2)

## 2023-05-29 LAB — LIPASE, BLOOD: Lipase: 28 U/L (ref 11–51)

## 2023-05-29 LAB — TROPONIN I (HIGH SENSITIVITY): Troponin I (High Sensitivity): 2 ng/L (ref <18)

## 2023-05-29 MED ORDER — SODIUM CHLORIDE 0.9 % IV BOLUS (SEPSIS)
1000.0000 mL | Freq: Once | INTRAVENOUS | Status: AC
  Administered 2023-05-29: 1000 mL via INTRAVENOUS

## 2023-05-29 MED ORDER — FENTANYL CITRATE PF 50 MCG/ML IJ SOSY
50.0000 ug | PREFILLED_SYRINGE | Freq: Once | INTRAMUSCULAR | Status: AC
  Administered 2023-05-29: 50 ug via INTRAVENOUS
  Filled 2023-05-29: qty 1

## 2023-05-29 NOTE — Patient Outreach (Signed)
Care Management/Care Coordination  RN Case Manager Case Closure Note  05/29/2023 Name: Stephanie [name] MRN: [id number] DOB: 1966-06-19  Stephanie [name] is a 57 y.o. year old female who is a primary care patient of Margarita Mail, DO. The care management/care coordination team was consulted for assistance with chronic disease management and/or care coordination needs.   Care Plan : RN Care Manager Plan of Care  Updates made by Heidi Dach, RN since 05/29/2023 12:00 AM  Completed 05/29/2023   Problem: Development of Plan of Care to address Health Management needs related to Stroke history Resolved 05/29/2023     Long-Range Goal: Development of Plan of Care to address Health Management needs related to Stroke history Completed 05/29/2023  Start Date: 05/08/2022  Expected End Date: 04/09/2023  Priority: High  Note:   Current Barriers:  Chronic Disease Management support and education needs related to Improving Health with history of Stroke Patient receiving Vyepti infusions for headaches. No headache since receiving infusions. Patient has questions regarding Valium, she prefers Xanax for anxiety. Currently having regular visits with [facility].  RNCM Clinical Goal(s):  Patient will verbalize understanding of plan for management of Improving Health with history of stroke as evidenced by patient reports attend all scheduled medical appointments: Echocardiogram on 06/09/23 and PCP on 08/04/23 as evidenced by provider documentation in EMR        continue to work with RN Care Manager and/or Social Worker to address care management and care coordination needs related to improving health with history of stroke as evidenced by adherence to CM Team Scheduled appointments     through collaboration with Medical illustrator, provider, and care team.   Interventions: Inter-disciplinary care team collaboration (see longitudinal plan of care) Evaluation of current treatment plan  related to  self management and patient's adherence to plan as established by provider Reviewed upcoming appointments including: 06/09/23 for echocardiogram, 11/25 with PCP Advised patient to discuss concerns regarding valium with provider at [facility] Advised patient to discuss incontinence supplies with PCP Discussed Case Closure, patient understands that she can contact RNCM with new needs/barriers to managing her health   Stroke:  (Status:Goal Met.) Long Term Goal Reviewed Importance of taking all medications as prescribed Reviewed Importance of attending all scheduled provider appointments Advised to report any changes in symptoms or exercise tolerance Assessed social determinant of health barriers Reviewed the importance of exercise Encouraged patient to continue to exercise at home(stationary bike) Advised patient to make sure she is drinking 6-8 glasses of water a day to improve hydration  Patient Goals/Self-Care Activities: Take medications as prescribed   Attend all scheduled provider appointments Call provider office for new concerns or questions        Plan: The patient has met all care management goals, agreed to case closure, and has been provided with contact information for the care management team. Appropriate care team members and provider have been notified via electronic communication. The care management team is available to at any time in the future should needs arise.   Estanislado Emms RN, BSN [place]  [facility] RN Care Coordinator [phone]

## 2023-05-29 NOTE — ED Provider Notes (Signed)
[place] EMERGENCY DEPARTMENT AT [facility] Provider Note   CSN: [id number] Arrival date & time: 05/29/23  1350     History  Chief Complaint  Patient presents with   Hypertension   Chest Pain   Abdominal Pain    Harman B Lemaitre is a 57 y.o. female.  The history is provided by the patient.   Patient with multiple medical conditions including diabetes, hypertension, obesity, IBS, CVA resents with multiple complaints.  Patient reports for the past 3 days she has had lower chest and upper abdominal pain that has been constant every day.  It does not radiate.  No nausea or vomiting, but does report nonbloody diarrhea.  No fevers.  She reports cough and shortness of breath.  She reports mild headache similar to prior migraines. Previous abdominal surgery includes hysterectomy.    Past Medical History:  Diagnosis Date   Closed fracture of shaft of humerus 01/20/2017   Diabetes mellitus without complication (HCC)    Difficult intubation    Diverticulitis    Headache    Hepatitis C    treated and resolved   History of kidney stones    Hypertension    Hypotension 08/22/2018   IBS (irritable bowel syndrome)    Kidney stones    Sciatica    Sleep apnea    on cpap   Stroke [facility]) 2017   memory loss   Superficial venous thrombosis of arm, left 07/22/2016    Home Medications Prior to Admission medications   Medication Sig Start Date End Date Taking? Authorizing Provider  albuterol (VENTOLIN HFA) 108 (90 Base) MCG/ACT inhaler Inhale 2 puffs into the lungs every 6 (six) hours as needed for wheezing or shortness of breath. 05/23/23   Margarita Mail, DO  ARIPiprazole (ABILIFY) 15 MG tablet Take 15 mg by mouth every morning. 06/10/21   [provider]  blood glucose meter kit and supplies Dispense based on patient and insurance preference. Use up to four times daily as directed. (FOR ICD-10 E10.9, E11.9). 04/23/23   Margarita Mail, DO  CAPLYTA 42 MG capsule  Take 42 mg by mouth every evening. 06/08/21   [provider]  clopidogrel (PLAVIX) 75 MG tablet Take 75 mg by mouth daily. 02/25/22   [provider]  Continuous Glucose Receiver (FREESTYLE LIBRE 2 READER) DEVI Patient lost her's 05/21/23   Margarita Mail, DO  Continuous Glucose Sensor (FREESTYLE LIBRE 2 SENSOR) MISC 1 each by Does not apply route every 14 (fourteen) days. 05/21/23   Margarita Mail, DO  diazepam (VALIUM) 10 MG tablet Take 10 mg by mouth 3 (three) times daily. 03/27/22   [provider]  fluticasone-salmeterol (ADVAIR) 100-50 MCG/ACT AEPB Inhale 1 puff into the lungs 2 (two) times daily. 05/21/23   Margarita Mail, DO  furosemide (LASIX) 40 MG tablet TAKE 1 TABLET BY MOUTH TWICE DAILY AS NEEDED FOR EDEMA IN THE MORNING. CAN TAKE ADDITIONAL TABLET IN THE AFTERNOON. 05/20/23   Margarita Mail, DO  KLOR-CON M20 20 MEQ tablet TAKE 1 TABLET BY MOUTH TWICE DAILY FOR 3 DAYS THEN TAKE 1 TABLET BY MOUTH DAILY WITH LASIX 05/20/23   Margarita Mail, DO  lamoTRIgine (LAMICTAL) 100 MG tablet Take 100 mg by mouth 2 (two) times daily.    [provider]  metFORMIN (GLUCOPHAGE-XR) 500 MG 24 hr tablet Take 1 tablet (500 mg total) by mouth 2 (two) times daily with a meal. 05/21/23   Margarita Mail, DO  omeprazole (PRILOSEC) 40 MG capsule Take 1  capsule (40 mg total) by mouth daily. 05/21/23   Margarita Mail, DO  prazosin (MINIPRESS) 2 MG capsule Take 4 mg by mouth every evening. 02/15/21   [provider]  promethazine (PHENERGAN) 12.5 MG tablet Take 1 tablet (12.5 mg total) by mouth every 8 (eight) hours as needed for nausea or vomiting. 01/15/23   Margarita Mail, DO  Semaglutide-Weight Management (WEGOVY) 0.25 MG/0.5ML SOAJ Inject 0.25 mg into the skin once a week. Patient not taking: Reported on 05/29/2023 04/15/23   Margarita Mail, DO  traZODone (DESYREL) 150 MG tablet Take by mouth at bedtime.    [provider]  Vilazodone HCl  (VIIBRYD) 40 MG TABS Take 40 mg by mouth daily.    [provider]      Allergies    Gabapentin, Ibuprofen, Sulfa antibiotics, Tylenol [acetaminophen], Aspirin, Buprenorphine hcl, Carbamazepine, Codeine, Compazine, Elemental sulfur, Ioxaglate, Ivp dye [iodinated contrast media], Metrizamide, Naproxen, Norco [hydrocodone-acetaminophen], Other, Penicillin g, Prochlorperazine maleate, Reglan [metoclopramide], Toradol [ketorolac tromethamine], Tramadol, and Zofran [ondansetron hcl]    Review of Systems   Review of Systems  Constitutional:  Negative for fever.  Cardiovascular:  Positive for chest pain.  Gastrointestinal:  Positive for abdominal pain and diarrhea. Negative for vomiting.    Physical Exam Updated Vital Signs BP (!) 150/78 (BP Location: Left Arm)   Pulse 91   Temp 98.4 F (36.9 C) (Oral)   Resp 16   SpO2 96%  Physical Exam CONSTITUTIONAL: Chronically ill-appearing, sleeping on my arrival to the room HEAD: Normocephalic/atraumatic EYES: EOMI/PERRL ENMT: Mucous membranes dry NECK: supple no meningeal signs SPINE/BACK:entire spine nontender CV: S1/S2 noted, no murmurs/rubs/gallops noted LUNGS: Lungs are clear to auscultation bilaterally, no apparent distress ABDOMEN: soft, obese and distended.  Diffuse moderate tenderness. GU:no cva tenderness NEURO: Pt is awake/alert/appropriate, moves all extremitiesx4.  No facial droop.   EXTREMITIES: pulses normal/equalx4, full ROM SKIN: warm, color normal PSYCH: Flat affect ED Results / Procedures / Treatments   Labs (all labs ordered are listed, but only abnormal results are displayed) Labs Reviewed  COMPREHENSIVE METABOLIC PANEL - Abnormal; Notable for the following components:      Result Value   Sodium 134 (*)    Potassium 3.4 (*)    Chloride 96 (*)    Glucose, Bld 166 (*)    All other components within normal limits  URINALYSIS, ROUTINE W REFLEX MICROSCOPIC - Abnormal; Notable for the following components:    APPearance HAZY (*)    Glucose, UA 150 (*)    Hgb urine dipstick SMALL (*)    Leukocytes,Ua LARGE (*)    Bacteria, UA RARE (*)    All other components within normal limits  CBC  LIPASE, BLOOD  TROPONIN I (HIGH SENSITIVITY)    EKG EKG Interpretation Date/Time:  Thursday May 29 2023 13:58:16 EDT Ventricular Rate:  103 PR Interval:    QRS Duration:  88 QT Interval:  312 QTC Calculation: 409 R Axis:   2  Text Interpretation: Sinus tachycardia Anterior infarct, old Confirmed by Zadie Rhine ([id number]) on 05/29/2023 11:17:57 PM  Radiology CT ABDOMEN PELVIS WO CONTRAST  Result Date: 05/30/2023 CLINICAL DATA:  Diffuse abdominal pain EXAM: CT ABDOMEN AND PELVIS WITHOUT CONTRAST TECHNIQUE: Multidetector CT imaging of the abdomen and pelvis was performed following the standard protocol without IV contrast. RADIATION DOSE REDUCTION: This exam was performed according to the departmental dose-optimization program which includes automated exposure control, adjustment of the mA and/or kV according to patient size and/or use of iterative reconstruction technique. COMPARISON:  02/22/2023 FINDINGS: Lower chest: Mild linear scarring/atelectasis lung bases. Hepatobiliary: Moderate hepatic steatosis focal fatty sparing along the gallbladder fossa. Gallbladder is unremarkable. No intrahepatic or extrahepatic ductal dilatation. Pancreas: Within normal limits. Spleen: Within normal limits. Adrenals/Urinary Tract: Adrenal glands are within normal limits. Right renal cortical scarring/atrophy. Dominant 2.3 cm right lower pole renal cyst, measuring simple fluid density, benign (Bosniak I). No follow-up is recommended. Multiple renal/parenchymal right renal calculi measuring up to 3 mm. 4 mm nonobstructing left upper pole renal calculus (series 2/image 28). Ureteral or bladder calculi. No hydronephrosis. Bladder is within normal limits. Stomach/Bowel: Stomach is within normal limits. No evidence of bowel  obstruction. Normal appendix (series 2/image 62). Sigmoid diverticulosis, without evidence of diverticulitis. Vascular/Lymphatic: No evidence of abdominal aortic aneurysm. Atherosclerotic calcifications of the abdominal aorta and branch vessels. No suspicious abdominopelvic lymphadenopathy. Reproductive: Status post hysterectomy. No adnexal masses. Other: No abdominopelvic ascites. Musculoskeletal: Mild degenerative changes of the visualized thoracolumbar spine. IMPRESSION: No CT findings to account for the patient's abdominal pain. Bilateral nonobstructing renal calculi, as above. No hydronephrosis. Sigmoid diverticulosis, without evidence of diverticulitis. Moderate hepatic steatosis. Electronically Signed   By: Charline Bills M.D.   On: 05/30/2023 00:47   DG Chest 2 View  Result Date: 05/29/2023 CLINICAL DATA:  Chest pain.  Abdominal pain.  Headache. EXAM: CHEST - 2 VIEW COMPARISON:  11/17/2022 FINDINGS: The heart is upper normal in size likely in part accentuated by technique. Mediastinal contours are unchanged. Aortic atherosclerosis. No focal airspace disease, large pleural effusion or pneumothorax. No pulmonary edema. On limited assessment, no acute osseous findings. IMPRESSION: Borderline cardiomegaly, likely in part accentuated by portable AP technique. No acute pulmonary process. Electronically Signed   By: Narda Rutherford M.D.   On: 05/29/2023 16:29    Procedures Procedures    Medications Ordered in ED Medications  fentaNYL (SUBLIMAZE) injection 50 mcg (50 mcg Intravenous Given 05/29/23 2359)  sodium chloride 0.9 % bolus 1,000 mL (0 mLs Intravenous Stopped 05/30/23 0122)    ED Course/ Medical Decision Making/ A&P Clinical Course as of 05/30/23 0159  Thu May 29, 2023  2349 Glucose(!): 166 Hyperglycemia [DW]  2349 Leukocytes,Ua(!): LARGE Urine findings are likely chronic, patient denies symptoms of UTI [DW]  Fri May 30, 2023  0159 Extensive workup was unremarkable.  Patient is up  drinking fluids in no acute distress.  Low suspicion for ACS.  No signs of acute intra-abdominal emergency. Patient be discharged home [DW]    Clinical Course User Index [DW] Zadie Rhine, MD                                 Medical Decision Making Amount and/or Complexity of Data Reviewed Labs: ordered. Decision-making details documented in ED Course. Radiology: ordered.  Risk Prescription drug management.   This patient presents to the ED for concern of abdominal pain, this involves an extensive number of treatment options, and is a complaint that carries with it a high risk of complications and morbidity.  The differential diagnosis includes but is not limited to cholecystitis, cholelithiasis, pancreatitis, gastritis, peptic ulcer disease, appendicitis, bowel obstruction, bowel perforation, diverticulitis, AAA, ischemic bowel, acute coronary syndrome    Comorbidities that complicate the patient evaluation: Patient's presentation is complicated by their history of diabetes, obesity  Social Determinants of Health: Patient's  multiple ER visits   increases the complexity of managing their presentation  Additional history obtained: Records reviewed Care Everywhere/External Records  Lab Tests: I Ordered, and personally interpreted labs.  The pertinent results include: Hyperglycemia  Imaging Studies ordered: I ordered imaging studies including X-ray chest x-ray   I independently visualized and interpreted imaging which showed no acute findings, chronic cardiomegaly I agree with the radiologist interpretation  Cardiac Monitoring: The patient was maintained on a cardiac monitor.  I personally viewed and interpreted the cardiac monitor which showed an underlying rhythm of:  sinus rhythm  Medicines ordered and prescription drug management: I ordered medication including fentanyl for pain Reevaluation of the patient after these medicines showed that the patient     improved  Reevaluation: After the interventions noted above, I reevaluated the patient and found that they have :improved  Complexity of problems addressed: Patient's presentation is most consistent with  acute presentation with potential threat to life or bodily function  Disposition: After consideration of the diagnostic results and the patient's response to treatment,  I feel that the patent would benefit from discharge   .   Patient had more abdominal tenderness on exam.  Low suspicion for ACS or acute cardiac or pulmonary emergency.  CT scan negative.  Patient safe for discharge home. Patient appears to have findings of chronic leukocytes in urine, no signs of UTI she has no symptoms, will defer antibiotics        Final Clinical Impression(s) / ED Diagnoses Final diagnoses:  Precordial pain  Generalized abdominal pain    Rx / DC Orders ED Discharge Orders     None         Zadie Rhine, MD 05/30/23 0201

## 2023-05-29 NOTE — ED Provider Triage Note (Signed)
Emergency Medicine Provider Triage Evaluation Note  KARSIN MOLINERO , a 57 y.o. female  was evaluated in triage.  Pt complains of central chest pain, shortness of breath, abdominal pain and headache for the past 3 days.  She also endorses cough.  Denies nausea, vomiting, fever, known sick contacts.    Review of Systems  Positive: As above Negative: As above  Physical Exam  There were no vitals taken for this visit. Gen:   Awake, no distress   Resp:  Shallow respirations, no adventitious lung sounds appreciated; patient no taking deep breaths MSK:   Moves extremities without difficulty  Other:    Medical Decision Making  Medically screening exam initiated at 1:56 PM.  Appropriate orders placed.  SHANYA ARMWOOD was informed that the remainder of the evaluation will be completed by another provider, this initial triage assessment does not replace that evaluation, and the importance of remaining in the ED until their evaluation is complete.     Melton Alar R, PA-C 05/29/23 1358

## 2023-05-29 NOTE — Telephone Encounter (Signed)
  Chief Complaint: abd pain, chest pain  Symptoms: see note for BP readings, diarrhea, trouble speaking  Pertinent Negatives: Patient denies dizziness, headache, bleeding Disposition: [x] ED /[] Urgent Care (no appt availability in office) / [] Appointment(In office/virtual)/ []  [facility]/ [] Home Care/ [] Refused Recommended Disposition /[] [facility]/ []  Follow-up with PCP Additional Notes: advised to call 911.  Reason for Disposition  [1] SEVERE pain (e.g., excruciating) AND [2] present > 1 hour  Answer Assessment - Initial Assessment Questions 1. LOCATION: "Where does it hurt?"      abdomen 2. RADIATION: "Does the pain shoot anywhere else?" (e.g., chest, back)     Chest pain radiating to left chest  5. PATTERN "Does the pain come and go, or is it constant?"    - If it comes and goes: "How long does it last?" "Do you have pain now?"     (Note: Comes and goes means the pain is intermittent. It goes away completely between bouts.)    - If constant: "Is it getting better, staying the same, or getting worse?"      (Note: Constant means the pain never goes away completely; most serious pain is constant and gets worse.)      constant 6. SEVERITY: "How bad is the pain?"  (e.g., Scale 1-10; mild, moderate, or severe)    - MILD (1-3): Doesn't interfere with normal activities, abdomen soft and not tender to touch.     - MODERATE (4-7): Interferes with normal activities or awakens from sleep, abdomen tender to touch.     - SEVERE (8-10): Excruciating pain, doubled over, unable to do any normal activities.       9/10 8. CAUSE: "What do you think is causing the stomach pain?"     Unsure  10. OTHER SYMPTOMS: "Do you have any other symptoms?" (e.g., back pain, diarrhea, fever, urination pain, vomiting)       Diarrhea x 3 times in last 24 hours  chest pain to mid chest radiating to left chest, having trouble speaking. HTN: 180/106 and 170/104  Protocols used: Abdominal Pain -  Female-A-AH

## 2023-05-29 NOTE — ED Triage Notes (Signed)
Patient here from home reporting central chest pain, abd pain, headache 3 days. Denies n/v.

## 2023-05-29 NOTE — Telephone Encounter (Signed)
Requested medication (s) are due for refill today:   Requested medication (s) are on the active medication list: Yes  Last refill:  Lasix 05/20/23  Future visit scheduled: Yes  Notes to clinic:  Plavix - historical provider, pt.'s mail order pharmacy.    Requested Prescriptions  Pending Prescriptions Disp Refills   furosemide (LASIX) 40 MG tablet 30 tablet 0     Cardiovascular:  Diuretics - Loop Failed - 05/28/2023 10:20 AM      Failed - Mg Level in normal range and within 180 days    Magnesium  Date Value Ref Range Status  11/20/2018 2.1 1.7 - 2.4 mg/dL Final    Comment:    Performed at [facility], [address] [place], [place] [address]         Passed - K in normal range and within 180 days    Potassium  Date Value Ref Range Status  05/19/2023 4.3 3.5 - 5.3 mmol/L Final  03/30/2014 3.7 3.5 - 5.1 mmol/L Final         Passed - Ca in normal range and within 180 days    Calcium  Date Value Ref Range Status  05/19/2023 9.4 8.6 - 10.4 mg/dL Final   Calcium, Total  Date Value Ref Range Status  03/30/2014 9.4 8.5 - 10.1 mg/dL Final   Calcium, Ion  Date Value Ref Range Status  03/07/2018 0.91 (L) 1.15 - 1.40 mmol/L Final         Passed - Na in normal range and within 180 days    Sodium  Date Value Ref Range Status  05/19/2023 142 135 - 146 mmol/L Final  06/27/2016 147 (H) 134 - 144 mmol/L Final  03/30/2014 135 (L) 136 - 145 mmol/L Final         Passed - Cr in normal range and within 180 days    Creat  Date Value Ref Range Status  05/19/2023 0.73 0.50 - 1.03 mg/dL Final   Creatinine, Urine  Date Value Ref Range Status  02/20/2022 87 20 - 275 mg/dL Final         Passed - Cl in normal range and within 180 days    Chloride  Date Value Ref Range Status  05/19/2023 103 98 - 110 mmol/L Final  03/30/2014 105 98 - 107 mmol/L Final         Passed - Last BP in normal range    BP Readings from Last 1 Encounters:  05/19/23 130/86         Passed -  Valid encounter within last 6 months    Recent Outpatient Visits           1 week ago Hypervolemia, unspecified hypervolemia type   [facility] Marg[facility]ita Mail, [facility]   1 month ago Type 2 diabetes mellitus with hyperglycemia, without long-term current use of insulin [facility])   [place] [facility] Margarita Mail, DO   4 months ago Type 2 diabetes mellitus with hyperglycemia, without long-term current use of insulin [facility])   [place] [facility] Marg[facility]ita Mail, DO   5 months ago Type 2 diabetes mellitus with hyperglycemia, without long-term current use of insulin [facility])   [facility] Marg[facility]ita Mail, DO   7 months ago Increased urinary frequency   [facility] Margarita Mail, DO       Future Appointments             In  1 month Margarita Mail, DO [place] [facility]             ibuprofen (ADVIL) 800 MG tablet 30 tablet 0    Sig: Take 1 tablet (800 mg total) by mouth as needed for moderate pain.     Analgesics:  NSAIDS Failed - 05/28/2023 10:20 AM      Failed - Manual Review: Labs are only required if the patient has taken medication for more than 8 weeks.      Passed - Cr in normal range and within 360 days    Creat  Date Value Ref Range Status  05/19/2023 0.73 0.50 - 1.03 mg/dL Final   Creatinine, Urine  Date Value Ref Range Status  02/20/2022 87 20 - 275 mg/dL Final         Passed - HGB in normal range and within 360 days    Hemoglobin  Date Value Ref Range Status  02/22/2023 13.7 12.0 - 15.0 g/dL Final  08/65/7846 96.2 11.1 - 15.9 g/dL Final         Passed - PLT in normal range and within 360 days    Platelets  Date Value Ref Range Status  02/22/2023 173 150 - 400 K/uL Final  06/27/2016 231 150 - 379 x10E3/uL Final         Passed - HCT in normal range and within 360 days    HCT  Date Value Ref Range Status   02/22/2023 43.6 36.0 - 46.0 % Final   Hematocrit  Date Value Ref Range Status  06/27/2016 44.9 34.0 - 46.6 % Final         Passed - eGFR is 30 or above and within 360 days    EGFR (African American)  Date Value Ref Range Status  03/30/2014 >60  Final   GFR calc Af Amer  Date Value Ref Range Status  04/20/2020 >60 >60 mL/min Final   EGFR (Non-African Amer.)  Date Value Ref Range Status  03/30/2014 >60  Final    Comment:    eGFR values <30mL/min/1.73 m2 may be an indication of chronic kidney disease (CKD). Calculated eGFR is useful in patients with stable renal function. The eGFR calculation will not be reliable in acutely ill patients when serum creatinine is changing rapidly. It is not useful in  patients on dialysis. The eGFR calculation may not be applicable to patients at the low and high extremes of body sizes, pregnant women, and vegetarians.    GFR, Estimated  Date Value Ref Range Status  02/22/2023 >60 >60 mL/min Final    Comment:    (NOTE) Calculated using the CKD-EPI Creatinine Equation (2021)    eGFR  Date Value Ref Range Status  03/29/2022 83 > OR = 60 mL/min/1.18m2 Final    Comment:    The eGFR is based on the CKD-EPI 2021 equation. To calculate  the new eGFR from a previous Creatinine or Cystatin C result, go to https://www.kidney.org/professionals/ kdoqi/gfr%5Fcalculator          Passed - Patient is not pregnant      Passed - Valid encounter within last 12 months    Recent Outpatient Visits           1 week ago Hypervolemia, unspecified hypervolemia type   [facility], DO   1 month ago Type 2 diabetes mellitus with hyperglycemia, without long-term current use of insulin [facility])   [facility] Margarita Mail, [place]  4 months ago Type 2 diabetes mellitus with hyperglycemia, without long-term current use of insulin [facility])   [place] [facility] Margarita Mail, DO   5 months ago Type 2 diabetes mellitus with hyperglycemia, without long-term current use of insulin [facility])   [place] [facility], DO   7 months ago Increased urinary frequency   [facility] Margarita Mail, DO       Future Appointments             In 1 month Margarita Mail, DO [place] [facility], [facility]             clopidogrel (PLAVIX) 75 MG tablet      Sig: Take 1 tablet (75 mg total) by mouth daily.     Hematology: Antiplatelets - clopidogrel Passed - 05/28/2023 10:20 AM      Passed - HCT in normal range and within 180 days    HCT  Date Value Ref Range Status  02/22/2023 43.6 36.0 - 46.0 % Final   Hematocrit  Date Value Ref Range Status  06/27/2016 44.9 34.0 - 46.6 % Final         Passed - HGB in normal range and within 180 days    Hemoglobin  Date Value Ref Range Status  02/22/2023 13.7 12.0 - 15.0 g/dL Final  41/32/4401 02.7 11.1 - 15.9 g/dL Final         Passed - PLT in normal range and within 180 days    Platelets  Date Value Ref Range Status  02/22/2023 173 150 - 400 K/uL Final  06/27/2016 231 150 - 379 x10E3/uL Final         Passed - Cr in normal range and within 360 days    Creat  Date Value Ref Range Status  05/19/2023 0.73 0.50 - 1.03 mg/dL Final   Creatinine, Urine  Date Value Ref Range Status  02/20/2022 87 20 - 275 mg/dL Final         Passed - Valid encounter within last 6 months    Recent Outpatient Visits           1 week ago Hypervolemia, unspecified hypervolemia type   [facility] Marg[facility]ita Mail, DO   1 month ago Type 2 diabetes mellitus with hyperglycemia, without long-term current use of insulin [facility])   [place] [facility] Margarita Mail, DO   4 months ago Type 2 diabetes mellitus with hyperglycemia, without long-term current use of insulin [facility])    [facility] Margarita Mail, DO   5 months ago Type 2 diabetes mellitus with hyperglycemia, without long-term current use of insulin [facility])   [facility] Margarita Mail, DO   7 months ago Increased urinary frequency   [facility] Margarita Mail, DO       Future Appointments             In 1 month Margarita Mail, DO [facility], [facility]

## 2023-05-30 ENCOUNTER — Other Ambulatory Visit: Payer: Self-pay | Admitting: Internal Medicine

## 2023-05-30 ENCOUNTER — Emergency Department (HOSPITAL_COMMUNITY): Payer: Medicaid Other

## 2023-05-30 DIAGNOSIS — K5730 Diverticulosis of large intestine without perforation or abscess without bleeding: Secondary | ICD-10-CM | POA: Diagnosis not present

## 2023-05-30 DIAGNOSIS — I10 Essential (primary) hypertension: Secondary | ICD-10-CM

## 2023-05-30 DIAGNOSIS — R0602 Shortness of breath: Secondary | ICD-10-CM

## 2023-05-30 DIAGNOSIS — R079 Chest pain, unspecified: Secondary | ICD-10-CM

## 2023-05-30 DIAGNOSIS — N200 Calculus of kidney: Secondary | ICD-10-CM | POA: Diagnosis not present

## 2023-05-30 DIAGNOSIS — R1084 Generalized abdominal pain: Secondary | ICD-10-CM | POA: Diagnosis not present

## 2023-05-30 NOTE — Discharge Instructions (Signed)
SEEK IMMEDIATE MEDICAL ATTENTION IF: The pain does not go away or becomes severe, particularly over the next 8-12 hours.  A temperature above 100.64F develops.  Repeated vomiting occurs (multiple episodes).  The pain becomes localized to portions of the abdomen. The right side could possibly be appendicitis. In an adult, the left lower portion of the abdomen could be colitis or diverticulitis.  Blood is being passed in stools or vomit (bright red or black tarry stools).  Return also if you develop chest pain, difficulty breathing, dizziness or fainting, or become confused, poorly responsive, or inconsolable.

## 2023-06-01 ENCOUNTER — Other Ambulatory Visit: Payer: Self-pay | Admitting: Internal Medicine

## 2023-06-01 DIAGNOSIS — M7989 Other specified soft tissue disorders: Secondary | ICD-10-CM

## 2023-06-02 NOTE — Telephone Encounter (Signed)
Requested Prescriptions  Pending Prescriptions Disp Refills   albuterol (VENTOLIN HFA) 108 (90 Base) MCG/ACT inhaler [Pharmacy Med Name: VENTOLIN HFA INH X1 108 (90 BAS Aerosol] 18 g 2    Sig: INHALE 2 PUFFS BY MOUTH EVERY 6 HOURS AS NEEDED FOR WHEEZING OR SHORTNESS OF BREATH     Pulmonology:  Beta Agonists 2 Failed - 05/29/2023  4:16 PM      Failed - Last BP in normal range    BP Readings from Last 1 Encounters:  05/30/23 116/83         Passed - Last Heart Rate in normal range    Pulse Readings from Last 1 Encounters:  05/30/23 95         Passed - Valid encounter within last 12 months    Recent Outpatient Visits           2 weeks ago Hypervolemia, unspecified hypervolemia type   [facility]   1 month ago Type 2 diabetes mellitus with hyperglycemia, without long-term current use of insulin [facility])   [place] [facility] Margarita Mail, DO   4 months ago Type 2 diabetes mellitus with hyperglycemia, without long-term current use of insulin [facility])   [place] [facility] Margarita Mail, DO   5 months ago Type 2 diabetes mellitus with hyperglycemia, without long-term current use of insulin [facility])   [facility] Margarita Mail, DO   7 months ago Increased urinary frequency   [facility] Margarita Mail, DO       Future Appointments             In 1 month Margarita Mail, DO [facility], [facility]

## 2023-06-03 ENCOUNTER — Telehealth (HOSPITAL_COMMUNITY): Payer: Self-pay | Admitting: *Deleted

## 2023-06-03 MED ORDER — IVABRADINE HCL 7.5 MG PO TABS: 15.0000 mg | ORAL_TABLET | Freq: Once | ORAL | 0 refills | Status: AC

## 2023-06-03 MED ORDER — METOPROLOL TARTRATE 100 MG PO TABS: ORAL_TABLET | ORAL | 0 refills | Status: DC

## 2023-06-03 NOTE — Telephone Encounter (Signed)
Requested Prescriptions  Pending Prescriptions Disp Refills   furosemide (LASIX) 40 MG tablet [Pharmacy Med Name: Furosemide 40 MG Oral Tablet] 30 tablet 0    Sig: TAKE 1 TABLET BY MOUTH TWICE DAILY AS NEEDED FOR EDEMA IN THE MORNING. CAN TAKE ADDITIONAL TABLET IN THE AFTERNOON.     Cardiovascular:  Diuretics - Loop Failed - 06/01/2023 11:12 AM      Failed - K in normal range and within 180 days    Potassium  Date Value Ref Range Status  05/29/2023 3.4 (L) 3.5 - 5.1 mmol/L Final  03/30/2014 3.7 3.5 - 5.1 mmol/L Final         Failed - Na in normal range and within 180 days    Sodium  Date Value Ref Range Status  05/29/2023 134 (L) 135 - 145 mmol/L Final  06/27/2016 147 (H) 134 - 144 mmol/L Final  03/30/2014 135 (L) 136 - 145 mmol/L Final         Failed - Cl in normal range and within 180 days    Chloride  Date Value Ref Range Status  05/29/2023 96 (L) 98 - 111 mmol/L Final  03/30/2014 105 98 - 107 mmol/L Final         Failed - Mg Level in normal range and within 180 days    Magnesium  Date Value Ref Range Status  11/20/2018 2.1 1.7 - 2.4 mg/dL Final    Comment:    Performed at [facility], [address] [place], [place] [address]         Passed - Ca in normal range and within 180 days    Calcium  Date Value Ref Range Status  05/29/2023 9.1 8.9 - 10.3 mg/dL Final   Calcium, Total  Date Value Ref Range Status  03/30/2014 9.4 8.5 - 10.1 mg/dL Final   Calcium, Ion  Date Value Ref Range Status  03/07/2018 0.91 (L) 1.15 - 1.40 mmol/L Final         Passed - Cr in normal range and within 180 days    Creat  Date Value Ref Range Status  05/19/2023 0.73 0.50 - 1.03 mg/dL Final   Creatinine, Ser  Date Value Ref Range Status  05/29/2023 0.74 0.44 - 1.00 mg/dL Final   Creatinine, Urine  Date Value Ref Range Status  02/20/2022 87 20 - 275 mg/dL Final         Passed - Last BP in normal range    BP Readings from Last 1 Encounters:  05/30/23 116/83          Passed - Valid encounter within last 6 months    Recent Outpatient Visits           2 weeks ago Hypervolemia, unspecified hypervolemia type   [facility], [facility]   1 month ago Type 2 diabetes mellitus with hyperglycemia, without long-term current use of insulin [facility])   [place] [facility] Margarita Mail, DO   4 months ago Type 2 diabetes mellitus with hyperglycemia, without long-term current use of insulin [facility])   [facility] Margarita Mail, DO   5 months ago Type 2 diabetes mellitus with hyperglycemia, without long-term current use of insulin [facility])   [facility] Margarita Mail, DO   7 months ago Increased urinary frequency   [facility] Margarita Mail, DO       Future Appointments  In 1 month Margarita Mail, DO [place] [facility], [facility]

## 2023-06-03 NOTE — Telephone Encounter (Signed)
Attempted to call patient regarding upcoming cardiac CT appointment. Left message on voicemail with name and callback number Johney Frame RN Navigator Cardiac Imaging [facility] [phone] Office   Medication sent to pharmacy, instructions sent via mychart.

## 2023-06-04 ENCOUNTER — Ambulatory Visit: Admission: RE | Admit: 2023-06-04 | Payer: Medicaid Other | Source: Ambulatory Visit

## 2023-06-09 ENCOUNTER — Other Ambulatory Visit: Payer: Self-pay

## 2023-06-09 ENCOUNTER — Ambulatory Visit: Payer: Self-pay

## 2023-06-09 ENCOUNTER — Emergency Department: Payer: Medicaid Other

## 2023-06-09 ENCOUNTER — Emergency Department
Admission: EM | Admit: 2023-06-09 | Discharge: 2023-06-09 | Disposition: A | Discharge status: Home / Self Care | Payer: Medicaid Other | Attending: Emergency Medicine | Admitting: Emergency Medicine

## 2023-06-09 ENCOUNTER — Other Ambulatory Visit: Payer: Self-pay | Admitting: Internal Medicine

## 2023-06-09 DIAGNOSIS — R531 Weakness: Secondary | ICD-10-CM | POA: Diagnosis not present

## 2023-06-09 DIAGNOSIS — I10 Essential (primary) hypertension: Secondary | ICD-10-CM | POA: Insufficient documentation

## 2023-06-09 DIAGNOSIS — R519 Headache, unspecified: Secondary | ICD-10-CM

## 2023-06-09 DIAGNOSIS — Z7689 Persons encountering health services in other specified circumstances: Secondary | ICD-10-CM | POA: Diagnosis not present

## 2023-06-09 DIAGNOSIS — R0602 Shortness of breath: Secondary | ICD-10-CM | POA: Diagnosis not present

## 2023-06-09 DIAGNOSIS — R0689 Other abnormalities of breathing: Secondary | ICD-10-CM | POA: Diagnosis not present

## 2023-06-09 DIAGNOSIS — G4489 Other headache syndrome: Secondary | ICD-10-CM | POA: Diagnosis not present

## 2023-06-09 DIAGNOSIS — M7989 Other specified soft tissue disorders: Secondary | ICD-10-CM

## 2023-06-09 DIAGNOSIS — R0789 Other chest pain: Secondary | ICD-10-CM | POA: Diagnosis not present

## 2023-06-09 DIAGNOSIS — G9389 Other specified disorders of brain: Secondary | ICD-10-CM | POA: Diagnosis not present

## 2023-06-09 LAB — URINALYSIS, ROUTINE W REFLEX MICROSCOPIC
Bilirubin Urine: NEGATIVE
Glucose, UA: >=500 mg/dL — AB
Hgb urine dipstick: NEGATIVE
Ketones, ur: NEGATIVE mg/dL
Nitrite: NEGATIVE
Protein, ur: NEGATIVE mg/dL
Specific Gravity, Urine: 1.02 (ref 1.005–1.030)
pH: 7 (ref 5.0–8.0)

## 2023-06-09 LAB — CBC WITH DIFFERENTIAL/PLATELET
Abs Immature Granulocytes: 0.04 10*3/uL (ref 0.00–0.07)
Basophils Absolute: 0 10*3/uL (ref 0.0–0.1)
Basophils Relative: 0 %
Eosinophils Absolute: 0 10*3/uL (ref 0.0–0.5)
Eosinophils Relative: 0 %
HCT: 41.4 % (ref 36.0–46.0)
Hemoglobin: 13.7 g/dL (ref 12.0–15.0)
Immature Granulocytes: 1 %
Lymphocytes Relative: 10 %
Lymphs Abs: 0.8 10*3/uL (ref 0.7–4.0)
MCH: 30 pg (ref 26.0–34.0)
MCHC: 33.1 g/dL (ref 30.0–36.0)
MCV: 90.6 fL (ref 80.0–100.0)
Monocytes Absolute: 0.1 10*3/uL (ref 0.1–1.0)
Monocytes Relative: 1 %
Neutro Abs: 6.9 10*3/uL (ref 1.7–7.7)
Neutrophils Relative %: 88 %
Platelets: 219 10*3/uL (ref 150–400)
RBC: 4.57 MIL/uL (ref 3.87–5.11)
RDW: 12.7 % (ref 11.5–15.5)
WBC: 7.7 10*3/uL (ref 4.0–10.5)
nRBC: 0 % (ref 0.0–0.2)

## 2023-06-09 LAB — BASIC METABOLIC PANEL
Anion gap: 14 (ref 5–15)
BUN: 11 mg/dL (ref 6–20)
CO2: 23 mmol/L (ref 22–32)
Calcium: 9.7 mg/dL (ref 8.9–10.3)
Chloride: 100 mmol/L (ref 98–111)
Creatinine, Ser: 0.78 mg/dL (ref 0.44–1.00)
GFR, Estimated: >60 mL/min (ref >60)
Glucose, Bld: 310 mg/dL — ABNORMAL HIGH (ref 70–99)
Potassium: 4.6 mmol/L (ref 3.5–5.1)
Sodium: 137 mmol/L (ref 135–145)

## 2023-06-09 MED ORDER — AMLODIPINE BESYLATE 5 MG PO TABS
5.0000 mg | ORAL_TABLET | Freq: Once | ORAL | Status: AC
  Administered 2023-06-09: 5 mg via ORAL
  Filled 2023-06-09: qty 1

## 2023-06-09 MED ORDER — SODIUM CHLORIDE 0.9 % IV BOLUS
500.0000 mL | Freq: Once | INTRAVENOUS | Status: AC
  Administered 2023-06-09: 500 mL via INTRAVENOUS

## 2023-06-09 MED ORDER — DROPERIDOL 2.5 MG/ML IJ SOLN
2.5000 mg | Freq: Once | INTRAMUSCULAR | Status: AC
  Administered 2023-06-09: 2.5 mg via INTRAVENOUS
  Filled 2023-06-09: qty 2

## 2023-06-09 MED ORDER — DEXAMETHASONE SODIUM PHOSPHATE 10 MG/ML IJ SOLN
10.0000 mg | Freq: Once | INTRAMUSCULAR | Status: AC
  Administered 2023-06-09: 10 mg via INTRAVENOUS
  Filled 2023-06-09: qty 1

## 2023-06-09 MED ORDER — DIPHENHYDRAMINE HCL 50 MG/ML IJ SOLN
12.5000 mg | INTRAMUSCULAR | Status: AC
  Administered 2023-06-09: 12.5 mg via INTRAVENOUS
  Filled 2023-06-09: qty 1

## 2023-06-09 NOTE — Telephone Encounter (Signed)
Chief Complaint: Elevated Blood Pressure  Symptoms: headache Frequency: comes and goes  Pertinent Negatives: Patient denies chest pain, blurred vision, nausea, vomiting  Disposition: [] ED /[] Urgent Care (no appt availability in office) / [x] Appointment(In office/virtual)/ []  [facility]/ [] Home Care/ [] Refused Recommended Disposition /[] [place] [facility]/ []  Follow-up with PCP Additional Notes: Patient states she has had elevated blood pressures for a few days now. Yesterday she went to the ED because the systolic was in the 200s and she had a headache. Patient stated the ED provider only treated her for her headache and did not address her elevated blood pressure. Patient reports current blood pressure is 161/102 and Heart rate 115 taken with home monitor. Patient denies a headache at this time. Patient was given care advice and was scheduled for PCP first available appointment 06/16/23.  Reason for Disposition  Systolic BP  >= 160 OR Diastolic >= 100  Answer Assessment - Initial Assessment Questions 1. BLOOD PRESSURE: "What is the blood pressure?" "Did you take at least two measurements 5 minutes apart?"     BP-161/102 P-115 right now 2. ONSET: "When did you take your blood pressure?"     Right now 3. HOW: "How did you take your blood pressure?" (e.g., automatic home BP monitor, visiting nurse)     Home BP monitor upper arm  4. HISTORY: "Do you have a history of high blood pressure?"     Yes  5. MEDICINES: "Are you taking any medicines for blood pressure?" "Have you missed any doses recently?"     Metoprolol 100 MG BID  6. OTHER SYMPTOMS: "Do you have any symptoms?" (e.g., blurred vision, chest pain, difficulty breathing, headache, weakness)     headache  Protocols used: Blood Pressure - High-A-AH

## 2023-06-09 NOTE — Discharge Instructions (Addendum)
Your workup in the Emergency Department today was reassuring.  We did not find any specific abnormalities.  We recommend you drink plenty of fluids, take your regular medications and/or any new ones prescribed today, and follow up with the doctor(s) listed in these documents as recommended.  Your blood pressure has been quite elevated as well.  It is important that you follow-up with your primary care doctor to discuss appropriate management of your blood pressure.  We gave you a dose of a medication called amlodipine to start treatment and you should discuss with your regular doctor if she wants you to continue on this medicine or try a different one, or if in fact you need continued treatment of high blood pressure at all.  Return to the Emergency Department if you develop new or worsening symptoms that concern you.

## 2023-06-09 NOTE — ED Provider Notes (Signed)
[facility] Provider Note    Event Date/Time   First MD Initiated Contact with Patient 06/09/23 [phone]     (approximate)   History   Insomnia (Pt called ems d/t no sleep in 2 days. Pt had weakness and headache x 2 days. Pt was hypertensive @214 /120- 186/110. Negative stroke screen. Hx CVA in 2017. EMS also reports pt had a string scent of urine. Pt did say she has some pain when she urinates. )   HPI Stephanie [name] is a 57 y.o. female who presents by EMS with reports of a persistent headache for 2 days that has kept her from being able to sleep.  Her blood pressure has been elevated but she said that she thinks her blood pressure is up because of the headache.  She had a stroke in 2017 but without any residual deficits.  She said that the headache is primarily on the right side of her head.  No visual changes.  No sensitivity to light.  No nausea nor vomiting.  She denies fever, neck pain or stiffness, chest pain, shortness of breath, nausea, vomiting, abdominal pain, and dysuria.     Physical Exam   Triage Vital Signs: ED Triage Vitals  Encounter Vitals Group     BP 06/09/23 0504 (!) 170/117     Systolic BP Percentile --      Diastolic BP Percentile --      Pulse Rate 06/09/23 0504 93     Resp 06/09/23 0504 (!) 22     Temp 06/09/23 0504 97.8 F (36.6 C)     Temp src --      SpO2 06/09/23 0504 93 %     Weight 06/09/23 0505 105.1 kg (231 lb 9.6 oz)     Height 06/09/23 0505 1.575 m (5\' 2" )     Head Circumference --      Peak Flow --      Pain Score --      Pain Loc --      Pain Education --      Exclude from Growth Chart --     Most recent vital signs: Vitals:   06/09/23 0630 06/09/23 0642  BP: (!) 172/94   Pulse: 70   Resp:  18  Temp:    SpO2: 90%     General: Awake, no distress.  Eyes:  Pupils are equal and reactive and extraocular movement is intact.  No obvious photophobia. CV:  Good peripheral perfusion.  Resp:  Normal effort.  Speaking easily and comfortably, no accessory muscle usage nor intercostal retractions.   Abd:  No distention.  No tenderness to palpation of the abdomen. Neuro:  No gross neurodeficits appreciated, moving all 4 extremities, awake, alert, oriented, no aphasia nor dysarthria, NIH stroke scale 0.   ED Results / Procedures / Treatments   Labs (all labs ordered are listed, but only abnormal results are displayed) Labs Reviewed  URINALYSIS, ROUTINE W REFLEX MICROSCOPIC - Abnormal; Notable for the following components:      Result Value   Color, Urine STRAW (*)    APPearance CLEAR (*)    Glucose, UA >=500 (*)    Leukocytes,Ua SMALL (*)    Bacteria, UA FEW (*)    All other components within normal limits  BASIC METABOLIC PANEL - Abnormal; Notable for the following components:   Glucose, Bld 310 (*)    All other components within normal limits  CBC WITH DIFFERENTIAL/PLATELET  CBC WITH DIFFERENTIAL/PLATELET  RADIOLOGY I viewed and interpreted the patient's CT head without contrast.  See hospital course for details, no acute abnormalities noted.   PROCEDURES:  Critical Care performed: No  .1-3 Lead EKG Interpretation  Performed by: Loleta Rose, MD Authorized by: Loleta Rose, MD     Interpretation: normal     ECG rate:  90   ECG rate assessment: normal     Rhythm: sinus rhythm     Ectopy: none     Conduction: normal       IMPRESSION / MDM / ASSESSMENT AND PLAN / ED COURSE  I reviewed the triage vital signs and the nursing notes.                              Differential diagnosis includes, but is not limited to, migraine, nonspecific headache, metabolic or electrolyte abnormality, acute intracranial hemorrhage, uncontrolled hypertension, neoplasm.  Patient's presentation is most consistent with acute presentation with potential threat to life or bodily function.  Labs/studies ordered: CT head, CBC with differential, BMP, urinalysis  Interventions/Medications  given:  Medications  amLODipine (NORVASC) tablet 5 mg (has no administration in time range)  droperidol (INAPSINE) 2.5 MG/ML injection 2.5 mg (2.5 mg Intravenous Given 06/09/23 0640)  diphenhydrAMINE (BENADRYL) injection 12.5 mg (12.5 mg Intravenous Given 06/09/23 0638)  sodium chloride 0.9 % bolus 500 mL (500 mLs Intravenous New Bag/Given 06/09/23 0640)  dexamethasone (DECADRON) injection 10 mg (10 mg Intravenous Given 06/09/23 0641)    (Note:  hospital course my include additional interventions and/or labs/studies not listed above.)   Patient is awake and alert and appears generally well despite her blood pressure and her report of headache.  She has not had a CT head performed since May 2024 so I will repeat 1 tonight since her headache is acute over the last 2 days.  She has had issues with urinary tract infection in the past and we will check urinalysis tonight as well given some report of occasional dysuria.  I will treat empirically for headache/migraine with medications as listed above.  I viewed her prior EKG from about 2 weeks ago and she has no evidence of QTc prolongation, so I felt comfortable choosing droperidol as the migraine medication.  The patient is on the cardiac monitor to evaluate for evidence of arrhythmia and/or significant heart rate changes.    Clinical Course as of 06/09/23 0715  Mon Jun 09, 2023  0630 CT Head Wo Contrast I viewed and interpreted the patient's head CT and I see no evidence of acute abnormality.  Radiology report notes persistent chronic CVA changes but no evidence of acute abnormality. [CF]  [id number] I reassessed the patient and she is sleeping comfortably.  I woke her up and she said that her headache is definitely improved.  Her blood pressure is still elevated and I am giving her a one-time dose of amlodipine 5 mg p.o. because she cannot remember what blood pressure medicine she takes.  However she has a regular doctor with whom she can follow-up, and I  encouraged her to do so for her uncontrolled hypertension, but I will not prescribe any new medications for her at this time so as not to conflict with the plan of her PCP.  Patient is comfortable with the plan for discharge home and knows to follow-up at the next available opportunity.  I gave my usual and customary return precautions. [CF]    Clinical Course User Index [CF]  Loleta Rose, MD     FINAL CLINICAL IMPRESSION(S) / ED DIAGNOSES   Final diagnoses:  Acute nonintractable headache, unspecified headache type  Uncontrolled hypertension     Rx / DC Orders   ED Discharge Orders     None        Note:  This document was prepared using Dragon voice recognition software and may include unintentional dictation errors.   Loleta Rose, MD 06/09/23 [phone]

## 2023-06-09 NOTE — ED Triage Notes (Signed)
Pt called ems d/t no sleep in 2 days. Pt had weakness and headache x 2 days. Pt was hypertensive @214 /120- 186/110. Negative stroke screen. Hx CVA in 2017. EMS also reports pt had a string scent of urine. Pt did say she has some pain when she urinates.

## 2023-06-10 ENCOUNTER — Telehealth: Payer: Self-pay | Admitting: Internal Medicine

## 2023-06-10 ENCOUNTER — Other Ambulatory Visit: Payer: Self-pay | Admitting: Internal Medicine

## 2023-06-10 ENCOUNTER — Telehealth (HOSPITAL_COMMUNITY): Payer: Self-pay | Admitting: Emergency Medicine

## 2023-06-10 DIAGNOSIS — E1165 Type 2 diabetes mellitus with hyperglycemia: Secondary | ICD-10-CM

## 2023-06-10 DIAGNOSIS — M7989 Other specified soft tissue disorders: Secondary | ICD-10-CM

## 2023-06-10 NOTE — Telephone Encounter (Signed)
Requested by patient refills.  Receipt confirmed by pharmacy 05/21/23 at 3:26 pm . Duplicate request.  Requested Prescriptions  Pending Prescriptions Disp Refills   furosemide (LASIX) 40 MG tablet 30 tablet 0     Cardiovascular:  Diuretics - Loop Failed - 06/10/2023 11:28 AM      Failed - Mg Level in normal range and within 180 days    Magnesium  Date Value Ref Range Status  11/20/2018 2.1 1.7 - 2.4 mg/dL Final    Comment:    Performed at [facility], [address] [place], [place] [address]         Failed - Last BP in normal range    BP Readings from Last 1 Encounters:  06/09/23 (!) 172/94         Passed - K in normal range and within 180 days    Potassium  Date Value Ref Range Status  06/09/2023 4.6 3.5 - 5.1 mmol/L Final  03/30/2014 3.7 3.5 - 5.1 mmol/L Final         Passed - Ca in normal range and within 180 days    Calcium  Date Value Ref Range Status  06/09/2023 9.7 8.9 - 10.3 mg/dL Final   Calcium, Total  Date Value Ref Range Status  03/30/2014 9.4 8.5 - 10.1 mg/dL Final   Calcium, Ion  Date Value Ref Range Status  03/07/2018 0.91 (L) 1.15 - 1.40 mmol/L Final         Passed - Na in normal range and within 180 days    Sodium  Date Value Ref Range Status  06/09/2023 137 135 - 145 mmol/L Final  06/27/2016 147 (H) 134 - 144 mmol/L Final  03/30/2014 135 (L) 136 - 145 mmol/L Final         Passed - Cr in normal range and within 180 days    Creat  Date Value Ref Range Status  05/19/2023 0.73 0.50 - 1.03 mg/dL Final   Creatinine, Ser  Date Value Ref Range Status  06/09/2023 0.78 0.44 - 1.00 mg/dL Final   Creatinine, Urine  Date Value Ref Range Status  02/20/2022 87 20 - 275 mg/dL Final         Passed - Cl in normal range and within 180 days    Chloride  Date Value Ref Range Status  06/09/2023 100 98 - 111 mmol/L Final  03/30/2014 105 98 - 107 mmol/L Final         Passed - Valid encounter within last 6 months    Recent Outpatient Visits            3 weeks ago Hypervolemia, unspecified hypervolemia type   [facility] Margarita Mail, DO   1 month ago Type 2 diabetes mellitus with hyperglycemia, without long-term current use of insulin [facility])   [facility] Margarita Mail, DO   4 months ago Type 2 diabetes mellitus with hyperglycemia, without long-term current use of insulin [facility])   [place] [facility] Margarita Mail, DO   5 months ago Type 2 diabetes mellitus with hyperglycemia, without long-term current use of insulin [facility])   [place] [facility] Margarita Mail, DO   7 months ago Increased urinary frequency   [facility] Margarita Mail, DO       Future Appointments             Tomorrow Zane Herald, Rudolpho Sevin, FNP [facility], [facility]  In 1 week Margarita Mail, DO [facility], [facility]            Refused Prescriptions Disp Refills   Continuous Glucose Sensor (FREESTYLE LIBRE 2 SENSOR) MISC 4 each 4    Sig: 1 each by Does not apply route every 14 (fourteen) days.     Endocrinology: Diabetes - Testing Supplies Passed - 06/10/2023 11:28 AM      Passed - Valid encounter within last 12 months    Recent Outpatient Visits           3 weeks ago Hypervolemia, unspecified hypervolemia type   [facility] Margarita Mail, DO   1 month ago Type 2 diabetes mellitus with hyperglycemia, without long-term current use of insulin [facility])   [facility] Margarita Mail, DO   4 months ago Type 2 diabetes mellitus with hyperglycemia, without long-term current use of insulin [facility])   [facility] Margarita Mail, DO   5 months ago Type 2 diabetes mellitus with hyperglycemia, without long-term current use of insulin [facility])   [facility] Margarita Mail, DO   7 months ago Increased urinary frequency   [facility] Margarita Mail, DO       Future Appointments             Tomorrow Zane Herald, Rudolpho Sevin, FNP [facility], [facility]   In 1 week Margarita Mail, DO [facility], [facility]

## 2023-06-10 NOTE — Telephone Encounter (Signed)
Requested medication (s) are due for refill today: see clinical note  Requested medication (s) are on the active medication list: yes   Last refill:  06/03/23 #30 0 refills  Future visit scheduled: yes tomorrow  Notes to clinic:  sent to [facility] and now patient wants sent to [facility]. Do you want to refill Rx?     Requested Prescriptions  Pending Prescriptions Disp Refills   furosemide (LASIX) 40 MG tablet 30 tablet 0     Cardiovascular:  Diuretics - Loop Failed - 06/10/2023 11:28 AM      Failed - Mg Level in normal range and within 180 days    Magnesium  Date Value Ref Range Status  11/20/2018 2.1 1.7 - 2.4 mg/dL Final    Comment:    Performed at [facility], [address] [place], [place] [address]         Failed - Last BP in normal range    BP Readings from Last 1 Encounters:  06/09/23 (!) 172/94         Passed - K in normal range and within 180 days    Potassium  Date Value Ref Range Status  06/09/2023 4.6 3.5 - 5.1 mmol/L Final  03/30/2014 3.7 3.5 - 5.1 mmol/L Final         Passed - Ca in normal range and within 180 days    Calcium  Date Value Ref Range Status  06/09/2023 9.7 8.9 - 10.3 mg/dL Final   Calcium, Total  Date Value Ref Range Status  03/30/2014 9.4 8.5 - 10.1 mg/dL Final   Calcium, Ion  Date Value Ref Range Status  03/07/2018 0.91 (L) 1.15 - 1.40 mmol/L Final         Passed - Na in normal range and within 180 days    Sodium  Date Value Ref Range Status  06/09/2023 137 135 - 145 mmol/L Final  06/27/2016 147 (H) 134 - 144 mmol/L Final  03/30/2014 135 (L) 136 - 145 mmol/L Final         Passed - Cr in normal range and within 180 days    Creat  Date Value Ref Range Status  05/19/2023 0.73 0.50 - 1.03 mg/dL Final   Creatinine, Ser  Date Value Ref Range Status  06/09/2023 0.78 0.44 - 1.00 mg/dL Final   Creatinine, Urine  Date Value Ref Range Status  02/20/2022 87 20 - 275 mg/dL Final         Passed - Cl in normal range  and within 180 days    Chloride  Date Value Ref Range Status  06/09/2023 100 98 - 111 mmol/L Final  03/30/2014 105 98 - 107 mmol/L Final         Passed - Valid encounter within last 6 months    Recent Outpatient Visits           3 weeks ago Hypervolemia, unspecified hypervolemia type   [facility], [facility]   1 month ago Type 2 diabetes mellitus with hyperglycemia, without long-term current use of insulin [facility])   [place] [facility] Margarita Mail, DO   4 months ago Type 2 diabetes mellitus with hyperglycemia, without long-term current use of insulin [facility])   [facility] Margarita Mail, DO   5 months ago Type 2 diabetes mellitus with hyperglycemia, without long-term current use of insulin [facility])   [facility] Margarita Mail, DO   7 months  ago Increased urinary frequency   [facility] Margarita Mail, [place]       Future Appointments             Tomorrow Zane Herald, Rudolpho Sevin, FNP [facility], [facility]   In 1 week Margarita Mail, DO [facility], [facility]            Refused Prescriptions Disp Refills   Continuous Glucose Sensor (FREESTYLE LIBRE 2 SENSOR) MISC 4 each 4    Sig: 1 each by Does not apply route every 14 (fourteen) days.     Endocrinology: Diabetes - Testing Supplies Passed - 06/10/2023 11:28 AM      Passed - Valid encounter within last 12 months    Recent Outpatient Visits           3 weeks ago Hypervolemia, unspecified hypervolemia type   [facility] Margarita Mail, DO   1 month ago Type 2 diabetes mellitus with hyperglycemia, without long-term current use of insulin [facility])   [facility] Margarita Mail, DO   4 months ago Type 2 diabetes mellitus with hyperglycemia, without long-term current use of insulin [facility])    [facility] Margarita Mail, DO   5 months ago Type 2 diabetes mellitus with hyperglycemia, without long-term current use of insulin [facility])   [facility] Margarita Mail, DO   7 months ago Increased urinary frequency   [facility] Margarita Mail, DO       Future Appointments             Tomorrow Zane Herald, Rudolpho Sevin, FNP [facility], [facility]   In 1 week Margarita Mail, DO [facility], [facility]

## 2023-06-10 NOTE — Telephone Encounter (Signed)
Attempted to call patient regarding upcoming cardiac CT appointment. °Left message on voicemail with name and callback number °Dwan Fennel RN Navigator Cardiac Imaging °[facility] °[phone] Office °[phone] Cell ° °

## 2023-06-10 NOTE — Telephone Encounter (Signed)
Patient must see provider first, at appt tomoroow

## 2023-06-10 NOTE — Telephone Encounter (Unsigned)
Copied from CRM [id number]. Topic: General - Other >> Jun 10, 2023 10:24 AM Everette C wrote: Reason for CRM: Medication Refill - Medication: furosemide (LASIX) 40 MG tablet [[id number]]  Continuous Glucose Sensor (FREESTYLE LIBRE 2 SENSOR) MISC [[id number]]  Has the patient contacted their pharmacy? Yes.   (Agent: If no, request that the patient contact the pharmacy for the refill. If patient does not wish to contact the pharmacy document the reason why and proceed with request.) (Agent: If yes, when and what did the pharmacy advise?)  Preferred Pharmacy (with phone number or street name): [facility] [address] [place] - [address] [place] [address] Phone: [phone] Fax: [phone] Hours: Not open 24 hours   Has the patient been seen for an appointment in the last year OR does the patient have an upcoming appointment? Yes.    Agent: Please be advised that RX refills may take up to 3 business days. We ask that you follow-up with your pharmacy.

## 2023-06-10 NOTE — Telephone Encounter (Addendum)
Patient has called and is requesting a referral for a CT scan for medicaid, for stomach and head. Patient states she has seen her PCP for this and PCP wanted her to have the CT scan done. Please advise.Patient aware she has an appt tomorrow with Della Goo.  Patient's callback # [phone]

## 2023-06-10 NOTE — Telephone Encounter (Signed)
Medication refills request sent last to [facility] instead of [facility].

## 2023-06-10 NOTE — Telephone Encounter (Signed)
Requested medication (s) are due for refill today: routing for review  Requested medication (s) are on the active medication list: yes  Last refill:  06/03/23  Future visit scheduled: yes  Notes to clinic:  short supply was given, should patient continue to take. Routing for approval.     Requested Prescriptions  Pending Prescriptions Disp Refills   furosemide (LASIX) 40 MG tablet [Pharmacy Med Name: FUROSEMIDE 40 MG TABS 40 Tablet] 30 tablet 10    Sig: TAKE 1 TABLET BY MOUTH TWICE DAILY AS NEEDED FOR EDEMA IN THE MORNING AND CAN TAKE AN ADDITIONAL TABLET IN THE AFTERNOON     Cardiovascular:  Diuretics - Loop Failed - 06/09/2023 11:36 AM      Failed - Mg Level in normal range and within 180 days    Magnesium  Date Value Ref Range Status  11/20/2018 2.1 1.7 - 2.4 mg/dL Final    Comment:    Performed at [facility], [address] [place], [place] [address]         Failed - Last BP in normal range    BP Readings from Last 1 Encounters:  06/09/23 (!) 172/94         Passed - K in normal range and within 180 days    Potassium  Date Value Ref Range Status  06/09/2023 4.6 3.5 - 5.1 mmol/L Final  03/30/2014 3.7 3.5 - 5.1 mmol/L Final         Passed - Ca in normal range and within 180 days    Calcium  Date Value Ref Range Status  06/09/2023 9.7 8.9 - 10.3 mg/dL Final   Calcium, Total  Date Value Ref Range Status  03/30/2014 9.4 8.5 - 10.1 mg/dL Final   Calcium, Ion  Date Value Ref Range Status  03/07/2018 0.91 (L) 1.15 - 1.40 mmol/L Final         Passed - Na in normal range and within 180 days    Sodium  Date Value Ref Range Status  06/09/2023 137 135 - 145 mmol/L Final  06/27/2016 147 (H) 134 - 144 mmol/L Final  03/30/2014 135 (L) 136 - 145 mmol/L Final         Passed - Cr in normal range and within 180 days    Creat  Date Value Ref Range Status  05/19/2023 0.73 0.50 - 1.03 mg/dL Final   Creatinine, Ser  Date Value Ref Range Status  06/09/2023  0.78 0.44 - 1.00 mg/dL Final   Creatinine, Urine  Date Value Ref Range Status  02/20/2022 87 20 - 275 mg/dL Final         Passed - Cl in normal range and within 180 days    Chloride  Date Value Ref Range Status  06/09/2023 100 98 - 111 mmol/L Final  03/30/2014 105 98 - 107 mmol/L Final         Passed - Valid encounter within last 6 months    Recent Outpatient Visits           3 weeks ago Hypervolemia, unspecified hypervolemia type   [facility] Margarita Mail, DO   1 month ago Type 2 diabetes mellitus with hyperglycemia, without long-term current use of insulin [facility])   [facility] Margarita Mail, DO   4 months ago Type 2 diabetes mellitus with hyperglycemia, without long-term current use of insulin [facility])   [facility] Margarita Mail, DO   5 months ago Type  2 diabetes mellitus with hyperglycemia, without long-term current use of insulin [facility])   [facility] Margarita Mail, DO   7 months ago Increased urinary frequency   [facility] Margarita Mail, DO       Future Appointments             Tomorrow Zane Herald, Rudolpho Sevin, FNP [facility], [facility]   In 1 week Margarita Mail, DO [facility], [facility]

## 2023-06-11 ENCOUNTER — Encounter: Payer: Self-pay | Admitting: Nurse Practitioner

## 2023-06-11 ENCOUNTER — Other Ambulatory Visit: Payer: Self-pay

## 2023-06-11 ENCOUNTER — Other Ambulatory Visit: Payer: Self-pay | Admitting: Internal Medicine

## 2023-06-11 ENCOUNTER — Ambulatory Visit: Payer: Medicaid Other | Admitting: Nurse Practitioner

## 2023-06-11 ENCOUNTER — Ambulatory Visit: Payer: Medicaid Other

## 2023-06-11 VITALS — BP 138/84 | HR 100 | Temp 98.4°F | Resp 16 | Ht 62.0 in | Wt 228.2 lb

## 2023-06-11 DIAGNOSIS — I10 Essential (primary) hypertension: Secondary | ICD-10-CM

## 2023-06-11 DIAGNOSIS — M7989 Other specified soft tissue disorders: Secondary | ICD-10-CM

## 2023-06-11 DIAGNOSIS — E876 Hypokalemia: Secondary | ICD-10-CM

## 2023-06-11 DIAGNOSIS — Z7689 Persons encountering health services in other specified circumstances: Secondary | ICD-10-CM | POA: Diagnosis not present

## 2023-06-11 MED ORDER — AMLODIPINE BESYLATE 2.5 MG PO TABS
2.5000 mg | ORAL_TABLET | Freq: Every day | ORAL | 0 refills | Status: DC
Start: 2023-06-11 — End: 2023-08-19

## 2023-06-11 NOTE — Assessment & Plan Note (Signed)
Start amlodipine 2.5 mg daily.  Follow up in 4 weeks. Bring blood pressure log and medications at next appointment.

## 2023-06-11 NOTE — Progress Notes (Signed)
BP 138/84   Pulse 100   Temp 98.4 F (36.9 C) (Oral)   Resp 16   Ht 5\' 2"  (1.575 m)   Wt 228 lb 3.2 oz (103.5 kg)   SpO2 93%   BMI 41.74 kg/m    Subjective:    [name]: Stephanie [name], female    DOB: 01-07-1966, 57 y.o.   MRN: [id number]  HPI: Stephanie [name] is a 57 y.o. female  Chief Complaint  Patient presents with   Hypertension    Elevated BP seen in ER   ER follow up/ Hypertension: -patient recently seen in er for headache and elevated blood pressure and treated with one dose of amlodipine, migraine cocktail. Ct scan of head done which was negative. Patient is currently reporting that her blood pressure has been running in the 200s the last 4 days.  She says she used to be on blood pressure medication.  According to her my chart she has previously been on losartan, amlodipine, metoprolol and hydrochlorothiazide.  Patient unaware of what medications she is currently taking, contacted pharmacy and they reported she is only on lasix that would affect blood pressure. Will restart patient on amlodipine follow up in 4 weeks.  -Medications: lasix 40 mg for edema -Patient is compliant with above medications and reports no side effects. -Checking BP at home (average): 200s -Denies any SOB, CP, vision changes, LE edema or symptoms of hypotension -Diet: recommend DASH diet  -Exercise: recommend 150 min of physical activity weekly        06/11/2023   10:38 AM 06/09/2023    6:30 AM 06/09/2023    5:05 AM  Vitals with BMI  Height 5\' 2"   5\' 2"   Weight 228 lbs 3 oz  231 lbs 10 oz  BMI 41.73  42.35  Systolic 138 172   Diastolic 84 94   Pulse 100 70     Relevant past medical, surgical, family and social history reviewed and updated as indicated. Interim medical history since our last visit reviewed. Allergies and medications reviewed and updated.  Review of Systems  Constitutional: Negative for fever or weight change.  Respiratory: Negative for cough and shortness of  breath.   Cardiovascular: Negative for chest pain or palpitations.  Gastrointestinal: Negative for abdominal pain, no bowel changes.  Musculoskeletal: Negative for gait problem or joint swelling.  Skin: Negative for rash.  Neurological: Negative for dizziness, positive for headache.  No other specific complaints in a complete review of systems (except as listed in HPI above).      Objective:    BP 138/84   Pulse 100   Temp 98.4 F (36.9 C) (Oral)   Resp 16   Ht 5\' 2"  (1.575 m)   Wt 228 lb 3.2 oz (103.5 kg)   SpO2 93%   BMI 41.74 kg/m   Wt Readings from Last 3 Encounters:  06/11/23 228 lb 3.2 oz (103.5 kg)  06/09/23 231 lb 9.6 oz (105.1 kg)  05/19/23 231 lb (104.8 kg)    Physical Exam  Constitutional: Patient appears well-developed and well-nourished. Obese  No distress.  HEENT: head atraumatic, normocephalic, pupils equal and reactive to light, neck supple Cardiovascular: Normal rate, regular rhythm and normal heart sounds.  No murmur heard. No BLE edema. Pulmonary/Chest: Effort normal and breath sounds normal. No respiratory distress. Abdominal: Soft.  There is no tenderness. Psychiatric: Patient has a normal mood and affect. behavior is normal. Judgment and thought content normal.  Results for orders placed or  performed during the hospital encounter of 06/09/23  Urinalysis, Routine w reflex microscopic -Urine, Clean Catch  Result Value Ref Range   Color, Urine STRAW (A) YELLOW   APPearance CLEAR (A) CLEAR   Specific Gravity, Urine 1.020 1.005 - 1.030   pH 7.0 5.0 - 8.0   Glucose, UA >=500 (A) NEGATIVE mg/dL   Hgb urine dipstick NEGATIVE NEGATIVE   Bilirubin Urine NEGATIVE NEGATIVE   Ketones, ur NEGATIVE NEGATIVE mg/dL   Protein, ur NEGATIVE NEGATIVE mg/dL   Nitrite NEGATIVE NEGATIVE   Leukocytes,Ua SMALL (A) NEGATIVE   RBC / HPF 0-5 0 - 5 RBC/hpf   WBC, UA 11-20 0 - 5 WBC/hpf   Bacteria, UA FEW (A) NONE SEEN   Squamous Epithelial / HPF 0-5 0 - 5 /HPF  Basic  metabolic panel  Result Value Ref Range   Sodium 137 135 - 145 mmol/L   Potassium 4.6 3.5 - 5.1 mmol/L   Chloride 100 98 - 111 mmol/L   CO2 23 22 - 32 mmol/L   Glucose, Bld 310 (H) 70 - 99 mg/dL   BUN 11 6 - 20 mg/dL   Creatinine, Ser 1.66 0.44 - 1.00 mg/dL   Calcium 9.7 8.9 - 06.3 mg/dL   GFR, Estimated >01 >60 mL/min   Anion gap 14 5 - 15  CBC with Differential/Platelet  Result Value Ref Range   WBC 7.7 4.0 - 10.5 K/uL   RBC 4.57 3.87 - 5.11 MIL/uL   Hemoglobin 13.7 12.0 - 15.0 g/dL   HCT 10.9 32.3 - 55.7 %   MCV 90.6 80.0 - 100.0 fL   MCH 30.0 26.0 - 34.0 pg   MCHC 33.1 30.0 - 36.0 g/dL   RDW 32.2 02.5 - 42.7 %   Platelets 219 150 - 400 K/uL   nRBC 0.0 0.0 - 0.2 %   Neutrophils Relative % 88 %   Neutro Abs 6.9 1.7 - 7.7 K/uL   Lymphocytes Relative 10 %   Lymphs Abs 0.8 0.7 - 4.0 K/uL   Monocytes Relative 1 %   Monocytes Absolute 0.1 0.1 - 1.0 K/uL   Eosinophils Relative 0 %   Eosinophils Absolute 0.0 0.0 - 0.5 K/uL   Basophils Relative 0 %   Basophils Absolute 0.0 0.0 - 0.1 K/uL   Immature Granulocytes 1 %   Abs Immature Granulocytes 0.04 0.00 - 0.07 K/uL      Assessment & Plan:   Problem List Items Addressed This Visit       Cardiovascular and Mediastinum   Primary hypertension - Primary    Start amlodipine 2.5 mg daily.  Follow up in 4 weeks. Bring blood pressure log and medications at next appointment.       Relevant Medications   amLODipine (NORVASC) 2.5 MG tablet     Follow up plan: Return in about 4 weeks (around 07/09/2023) for follow up with dr Caralee Ates.

## 2023-06-11 NOTE — Telephone Encounter (Signed)
Requested Prescriptions  Pending Prescriptions Disp Refills   furosemide (LASIX) 40 MG tablet [Pharmacy Med Name: Furosemide 40 MG Oral Tablet] 30 tablet 0    Sig: TAKE 1 TABLET BY MOUTH TWICE DAILY AS NEEDED FOR EDEMA IN THE MORNING. CAN TAKE ADDITIONAL TABLET IN THE AFTERNOON.     Cardiovascular:  Diuretics - Loop Failed - 06/11/2023 10:28 AM      Failed - Mg Level in normal range and within 180 days    Magnesium  Date Value Ref Range Status  11/20/2018 2.1 1.7 - 2.4 mg/dL Final    Comment:    Performed at [facility], [address] [place], [place] [address]         Failed - Last BP in normal range    BP Readings from Last 1 Encounters:  06/11/23 138/84         Passed - K in normal range and within 180 days    Potassium  Date Value Ref Range Status  06/09/2023 4.6 3.5 - 5.1 mmol/L Final  03/30/2014 3.7 3.5 - 5.1 mmol/L Final         Passed - Ca in normal range and within 180 days    Calcium  Date Value Ref Range Status  06/09/2023 9.7 8.9 - 10.3 mg/dL Final   Calcium, Total  Date Value Ref Range Status  03/30/2014 9.4 8.5 - 10.1 mg/dL Final   Calcium, Ion  Date Value Ref Range Status  03/07/2018 0.91 (L) 1.15 - 1.40 mmol/L Final         Passed - Na in normal range and within 180 days    Sodium  Date Value Ref Range Status  06/09/2023 137 135 - 145 mmol/L Final  06/27/2016 147 (H) 134 - 144 mmol/L Final  03/30/2014 135 (L) 136 - 145 mmol/L Final         Passed - Cr in normal range and within 180 days    Creat  Date Value Ref Range Status  05/19/2023 0.73 0.50 - 1.03 mg/dL Final   Creatinine, Ser  Date Value Ref Range Status  06/09/2023 0.78 0.44 - 1.00 mg/dL Final   Creatinine, Urine  Date Value Ref Range Status  02/20/2022 87 20 - 275 mg/dL Final         Passed - Cl in normal range and within 180 days    Chloride  Date Value Ref Range Status  06/09/2023 100 98 - 111 mmol/L Final  03/30/2014 105 98 - 107 mmol/L Final         Passed -  Valid encounter within last 6 months    Recent Outpatient Visits           Today Primary hypertension   [facility] Berniece Salines, FNP   3 weeks ago Hypervolemia, unspecified hypervolemia type   [facility] Margarita Mail, DO   1 month ago Type 2 diabetes mellitus with hyperglycemia, without long-term current use of insulin [facility])   [place] [facility] Margarita Mail, DO   4 months ago Type 2 diabetes mellitus with hyperglycemia, without long-term current use of insulin [facility])   [place] [facility] Margarita Mail, DO   6 months ago Type 2 diabetes mellitus with hyperglycemia, without long-term current use of insulin [facility])   [facility] Margarita Mail, DO       Future Appointments  In 1 week Margarita Mail, DO [place] [facility]   In 3 weeks Margarita Mail, DO [facility]             potassium chloride SA (KLOR-CON M) 20 MEQ tablet [place] Med Name: Potassium Chloride Crys ER 20 MEQ Oral Tablet Extended Release] 90 tablet 0    Sig: TAKE 1 TABLET BY MOUTH TWICE DAILY FOR 3 DAYS THEN TAKE 1 TABLET BY MOUTH DAILY WITH LASIX     Endocrinology:  Minerals - Potassium Supplementation Passed - 06/11/2023 10:28 AM      Passed - K in normal range and within 360 days    Potassium  Date Value Ref Range Status  06/09/2023 4.6 3.5 - 5.1 mmol/L Final  03/30/2014 3.7 3.5 - 5.1 mmol/L Final         Passed - Cr in normal range and within 360 days    Creat  Date Value Ref Range Status  05/19/2023 0.73 0.50 - 1.03 mg/dL Final   Creatinine, Ser  Date Value Ref Range Status  06/09/2023 0.78 0.44 - 1.00 mg/dL Final   Creatinine, Urine  Date Value Ref Range Status  02/20/2022 87 20 - 275 mg/dL Final         Passed - Valid encounter within last 12 months    Recent Outpatient  Visits           Today Primary hypertension   [facility] Berniece Salines, FNP   3 weeks ago Hypervolemia, unspecified hypervolemia type   [facility] Margarita Mail, DO   1 month ago Type 2 diabetes mellitus with hyperglycemia, without long-term current use of insulin [facility])   [place] [facility] Margarita Mail, DO   4 months ago Type 2 diabetes mellitus with hyperglycemia, without long-term current use of insulin [facility])   [place] [facility] Margarita Mail, DO   6 months ago Type 2 diabetes mellitus with hyperglycemia, without long-term current use of insulin [facility])   [facility] Margarita Mail, DO       Future Appointments             In 1 week Margarita Mail, DO [place] [facility], [facility]   In 3 weeks Margarita Mail, DO [facility]

## 2023-06-16 ENCOUNTER — Other Ambulatory Visit: Payer: Self-pay | Admitting: Internal Medicine

## 2023-06-16 ENCOUNTER — Ambulatory Visit: Payer: Medicaid Other | Admitting: Internal Medicine

## 2023-06-16 NOTE — Telephone Encounter (Signed)
Medication Refill - Medication:   Patient has called and states she spoke with [facility] who sent over 2 requests two times each for 2 prescriptions and she does not know the names of medication or which ones but she states she spoke to a live person at pharmacy who states the pharmacy has not received these 2 prescriptions  Has the patient contacted their pharmacy? Yes, advised to contact PCP  Preferred Pharmacy (with phone number or street name):  [facility] [place], [place] - [address]  Phone: [phone]  Has the patient been seen for an appointment in the last year OR does the patient have an upcoming appointment? Yes. F/U scheduled for 10.14.2024   Patient's callback # [phone]

## 2023-06-16 NOTE — Telephone Encounter (Signed)
[facility] called, extremely long hold, will try again later.

## 2023-06-17 ENCOUNTER — Telehealth (HOSPITAL_COMMUNITY): Payer: Self-pay | Admitting: *Deleted

## 2023-06-17 NOTE — Telephone Encounter (Signed)
Reaching out to patient to offer assistance regarding upcoming cardiac imaging study; pt verbalizes understanding of appt date/time, parking situation and where to check in, pre-test NPO status and medications ordered, and verified current allergies; name and call back number provided for further questions should they arise Hayley Sharpe RN Navigator Cardiac Imaging [facility] [phone] office [phone] cell  

## 2023-06-17 NOTE — Addendum Note (Signed)
Addended by: Nena Polio on: 06/17/2023 12:34 PM   Modules accepted: Orders

## 2023-06-17 NOTE — Telephone Encounter (Signed)
See previous TE encounter, please advise patient.

## 2023-06-17 NOTE — Telephone Encounter (Signed)
[facility] and spoke with Melissa. She states the only medication that they had requested was diazepam 10 mg. She states a new RX is needed, because the medication was transferred to mail order pharmacy at one time and that can only be done once.  Melissa states, a new RX is needed to refill at [facility]. Please advise.

## 2023-06-18 ENCOUNTER — Ambulatory Visit
Admission: RE | Admit: 2023-06-18 | Discharge: 2023-06-18 | Disposition: A | Discharge status: Home / Self Care | Payer: Medicaid Other | Source: Ambulatory Visit | Attending: Internal Medicine | Admitting: Internal Medicine

## 2023-06-18 ENCOUNTER — Telehealth (HOSPITAL_COMMUNITY): Payer: Self-pay | Admitting: *Deleted

## 2023-06-18 DIAGNOSIS — Z7689 Persons encountering health services in other specified circumstances: Secondary | ICD-10-CM | POA: Diagnosis not present

## 2023-06-18 DIAGNOSIS — R079 Chest pain, unspecified: Secondary | ICD-10-CM

## 2023-06-18 DIAGNOSIS — R0602 Shortness of breath: Secondary | ICD-10-CM

## 2023-06-18 DIAGNOSIS — I10 Essential (primary) hypertension: Secondary | ICD-10-CM

## 2023-06-18 NOTE — Telephone Encounter (Signed)
Reaching out to [facility] to inform them that they will need to consider alternative testing.  Patient's HR was 98 despite taking her 13 hour prep, 100mg  metoprolol tartrate and 15mg  ivabradine for her cardiac CT scan.  Larey Brick RN Navigator Cardiac Imaging [facility] V[facility] Services [phone] Office [phone] Cell

## 2023-06-18 NOTE — Progress Notes (Signed)
Pt not able to complete scan due to elevated HR. Pt agreeable with alternate testing.

## 2023-06-18 NOTE — Progress Notes (Addendum)
Pt arrived and states no allergy to the contrast dye. Pt states she did take all her prep meds and the Bp meds she was given. Hr 98 and BP 120/62. Will inform nurse navigators that we will need to reschedule test due to HR.  Pt advised also that she did hour of exercise and had coffee this morning. We advised her to not do those things next time.

## 2023-06-20 DIAGNOSIS — E669 Obesity, unspecified: Secondary | ICD-10-CM | POA: Diagnosis not present

## 2023-06-20 DIAGNOSIS — R001 Bradycardia, unspecified: Secondary | ICD-10-CM | POA: Diagnosis not present

## 2023-06-20 DIAGNOSIS — E119 Type 2 diabetes mellitus without complications: Secondary | ICD-10-CM | POA: Diagnosis not present

## 2023-06-20 DIAGNOSIS — R079 Chest pain, unspecified: Secondary | ICD-10-CM | POA: Diagnosis not present

## 2023-06-20 DIAGNOSIS — R0602 Shortness of breath: Secondary | ICD-10-CM | POA: Diagnosis not present

## 2023-06-20 DIAGNOSIS — I10 Essential (primary) hypertension: Secondary | ICD-10-CM | POA: Diagnosis not present

## 2023-06-20 DIAGNOSIS — E7800 Pure hypercholesterolemia, unspecified: Secondary | ICD-10-CM | POA: Diagnosis not present

## 2023-06-20 DIAGNOSIS — I209 Angina pectoris, unspecified: Secondary | ICD-10-CM | POA: Diagnosis not present

## 2023-06-20 DIAGNOSIS — K219 Gastro-esophageal reflux disease without esophagitis: Secondary | ICD-10-CM | POA: Diagnosis not present

## 2023-06-20 DIAGNOSIS — G4733 Obstructive sleep apnea (adult) (pediatric): Secondary | ICD-10-CM | POA: Diagnosis not present

## 2023-06-23 ENCOUNTER — Ambulatory Visit: Payer: Self-pay

## 2023-06-23 ENCOUNTER — Ambulatory Visit: Payer: Medicaid Other | Admitting: Internal Medicine

## 2023-06-23 NOTE — Telephone Encounter (Signed)
Chief Complaint: High blood sugar  Symptoms: High blood sugar readings Frequency: one week Pertinent Negatives: Patient denies other symptoms Disposition: [] ED /[] Urgent Care (no appt availability in office) / [x] Appointment(In office/virtual)/ []  [place] [facility]/ [] Home Care/ [] Refused Recommended Disposition /[] [place] [facility]/ []  Follow-up with PCP Additional Notes: Patient states she has had elevated blood sugars for about 1 week. Today the blood sugar was 384 a few minutes ago. Patient reports her usual range for blood sugar is 120-170. Patient states she has been taking her Metformin as directed and does not know why it is elevated now. Patient denies any other symptoms. Care advice was given and patient has scheduled for PCP first available appointment 06/30/23. Patient has to request transportation 3 days in advance if provider wants to see her sooner.  Reason for Disposition  [1] Blood glucose > 300 mg/dL (78.2 mmol/L) AND [9] does not  use insulin (e.g., not insulin-dependent; most people with type 2 diabetes)  Answer Assessment - Initial Assessment Questions 1. BLOOD GLUCOSE: "What is your blood glucose level?"      384 2. ONSET: "When did you check the blood glucose?"     Today a few minutes ago 3. USUAL RANGE: "What is your glucose level usually?" (e.g., usual fasting morning value, usual evening value)     120-170        5. TYPE 1 or 2:  "Do you know what type of diabetes you have?"  (e.g., Type 1, Type 2, Gestational; doesn't know)      Type 2 6. INSULIN: "Do you take insulin?" "What type of insulin(s) do you use? What is the mode of delivery? (syringe, pen; injection or pump)?"      No 7. DIABETES PILLS: "Do you take any pills for your diabetes?" If Yes, ask: "Have you missed taking any pills recently?"     Metformin no I have not missed any doses  8. OTHER SYMPTOMS: "Do you have any symptoms?" (e.g., fever, frequent urination, difficulty breathing, dizziness,  weakness, vomiting)     No  Protocols used: Diabetes - High Blood Sugar-A-AH

## 2023-06-25 ENCOUNTER — Ambulatory Visit: Payer: Self-pay | Admitting: *Deleted

## 2023-06-25 NOTE — Telephone Encounter (Signed)
Left detailed vm °

## 2023-06-25 NOTE — Telephone Encounter (Signed)
  Chief Complaint: Glucose meter is reading "High".   Top range is 350 then it reads "High". Symptoms: Thirsty, urinating frequently and has a mild headache.   Has one more day left on this glucose sensor she has on.   When she first put it on it was reading around 140 but now today it's reading "High".   She has an appt on Mon. With Dr. Caralee Ates because her glucose has been elevated. Frequency: Past few days. Pertinent Negatives: Patient denies dizziness, vomiting, weakness or breaking out in sweats. Disposition: [] ED /[] Urgent Care (no appt availability in office) / [] Appointment(In office/virtual)/ []  [place] [facility]/ [] Home Care/ [] Refused Recommended Disposition /[] [place] [facility]/ [x]  Follow-up with PCP Additional Notes: High priority message being sent to Dr. Caralee Ates.    I let pt know someone would be calling her back with directions from Dr. Caralee Ates. Pt. Has an appt with Dr. Caralee Ates on Mon. For elevated glucose levels.

## 2023-06-25 NOTE — Telephone Encounter (Signed)
Reason for Disposition  [1] Blood glucose > 300 mg/dL (82.9 mmol/L) AND [5] two or more times in a row    Glucose meter max is 350.  She is getting readings of "High".   Urinating frequently and has a mild headache.   Drinking a lot of water too.  Answer Assessment - Initial Assessment Questions 1. BLOOD GLUCOSE: "What is your blood glucose level?"      My glucose meter is registering no numbers but says 350 is my max it will measure.   It's been 230-280.   When first put sensor on it was reading 140.    2. ONSET: "When did you check the blood glucose?"     7:00 AM and 10:30 this morning  3. USUAL RANGE: "What is your glucose level usually?" (e.g., usual fasting morning value, usual evening value)     140. I'm drinking a lot of water.    I have a mild headache.   4. KETONES: "Do you check for ketones (urine or blood test strips)?" If Yes, ask: "What does the test show now?"      Not asked 5. TYPE 1 or 2:  "Do you know what type of diabetes you have?"  (e.g., Type 1, Type 2, Gestational; doesn't know)       6. INSULIN: "Do you take insulin?" "What type of insulin(s) do you use? What is the mode of delivery? (syringe, pen; injection or pump)?"      I'm on Lasix from 20 mg to 40 mg now. No insulin 7. DIABETES PILLS: "Do you take any pills for your diabetes?" If Yes, ask: "Have you missed taking any pills recently?"     Yes  8. OTHER SYMPTOMS: "Do you have any symptoms?" (e.g., fever, frequent urination, difficulty breathing, dizziness, weakness, vomiting)     Mild headache and urinary frequency.    I take metformin 9. PREGNANCY: "Is there any chance you are pregnant?" "When was your last menstrual period?"     Not asked  Protocols used: Diabetes - High Blood Sugar-A-AH

## 2023-06-29 NOTE — Progress Notes (Unsigned)
Acute Office Visit  Subjective:     [name]: Stephanie [name], female    DOB: 01-02-1966, 57 y.o.   MRN: [id number]  No chief complaint on file.   HPI Patient is in today for elevated blood sugar.   Diabetes, Type 2: -Last A1c 8/24 7.2% -Medications: Metformin 500 mg BID (had been on Glipizide in the past). Had been prescribed Actos but not currently taking. Tried to prescribe Rybelsus in the past but cost was too much.  -Patient is compliant with the above medications and reports no side effects. Diarrhea stopped since switching to Metformin ER -Checking BG at home: has been over 200 recently  -Eye exam: UTD 8/23 -Foot exam: UTD 9/23 -Microalbumin: Due  -Statin: yes -PNA vaccine: UTD -Denies symptoms of hypoglycemia, polyuria, polydipsia, numbness extremities, foot ulcers/trauma.  ROS      Objective:    There were no vitals taken for this visit. {Vitals History (Optional):23777}  Physical Exam  No results found for any visits on 06/30/23.      Assessment & Plan:   Problem List Items Addressed This Visit   None   No orders of the defined types were placed in this encounter.   No follow-ups on file.  Margarita Mail, DO

## 2023-06-30 ENCOUNTER — Encounter: Payer: Self-pay | Admitting: Internal Medicine

## 2023-06-30 ENCOUNTER — Ambulatory Visit (INDEPENDENT_AMBULATORY_CARE_PROVIDER_SITE_OTHER): Payer: Medicaid Other | Admitting: Internal Medicine

## 2023-06-30 VITALS — BP 150/100 | HR 109 | Temp 98.5°F | Resp 16 | Ht 62.0 in | Wt 220.7 lb

## 2023-06-30 DIAGNOSIS — I10 Essential (primary) hypertension: Secondary | ICD-10-CM

## 2023-06-30 DIAGNOSIS — Z7984 Long term (current) use of oral hypoglycemic drugs: Secondary | ICD-10-CM

## 2023-06-30 DIAGNOSIS — E1165 Type 2 diabetes mellitus with hyperglycemia: Secondary | ICD-10-CM

## 2023-06-30 DIAGNOSIS — Z7689 Persons encountering health services in other specified circumstances: Secondary | ICD-10-CM | POA: Diagnosis not present

## 2023-06-30 MED ORDER — LISINOPRIL 10 MG PO TABS
10.0000 mg | ORAL_TABLET | Freq: Every day | ORAL | 1 refills | Status: DC
Start: 2023-06-30 — End: 2023-08-19

## 2023-06-30 MED ORDER — OZEMPIC (0.25 OR 0.5 MG/DOSE) 2 MG/3ML ~~LOC~~ SOPN
0.2500 mg | PEN_INJECTOR | SUBCUTANEOUS | 0 refills | Status: DC
Start: 2023-06-30 — End: 2023-08-01

## 2023-06-30 NOTE — Telephone Encounter (Signed)
Copied from CRM [id number]. Topic: General - Other >> Jun 30, 2023  1:46 PM Turkey B wrote: Reason for CRM: pt called in about of paperwork was received for Ozempic,. She states it was sent by [facility] pharmacy today

## 2023-07-01 LAB — MICROALBUMIN / CREATININE URINE RATIO
Creatinine, Urine: 118 mg/dL (ref 20–275)
Microalb Creat Ratio: 47 mg/g{creat} — ABNORMAL HIGH (ref <30)
Microalb, Ur: 5.5 mg/dL

## 2023-07-07 DIAGNOSIS — Z7689 Persons encountering health services in other specified circumstances: Secondary | ICD-10-CM | POA: Diagnosis not present

## 2023-07-08 ENCOUNTER — Ambulatory Visit: Payer: Medicaid Other | Admitting: Internal Medicine

## 2023-07-09 ENCOUNTER — Telehealth (HOSPITAL_COMMUNITY): Payer: Self-pay | Admitting: Emergency Medicine

## 2023-07-09 DIAGNOSIS — Z7689 Persons encountering health services in other specified circumstances: Secondary | ICD-10-CM | POA: Diagnosis not present

## 2023-07-09 DIAGNOSIS — R079 Chest pain, unspecified: Secondary | ICD-10-CM

## 2023-07-09 MED ORDER — DIPHENHYDRAMINE HCL 50 MG PO TABS
ORAL_TABLET | ORAL | 0 refills | Status: DC
Start: 2023-07-09 — End: 2023-07-09

## 2023-07-09 MED ORDER — IVABRADINE HCL 5 MG PO TABS
15.0000 mg | ORAL_TABLET | Freq: Once | ORAL | 0 refills | Status: DC
Start: 2023-07-09 — End: 2023-07-09

## 2023-07-09 MED ORDER — METOPROLOL TARTRATE 100 MG PO TABS
100.0000 mg | ORAL_TABLET | Freq: Once | ORAL | 0 refills | Status: DC
Start: 2023-07-09 — End: 2023-07-09

## 2023-07-09 MED ORDER — PREDNISONE 50 MG PO TABS
ORAL_TABLET | ORAL | 0 refills | Status: DC
Start: 2023-07-09 — End: 2023-07-09

## 2023-07-09 NOTE — Telephone Encounter (Signed)
Metoprolol tart 100mg   Ivabradine 15mg   Prednisone 50mg  x 3 Benadryl 50mg  x 1   Sent to pharm on file for CCTA Monday 11/4  Rockwell Alexandria RN Navigator Cardiac Imaging [facility] Heart and Vascular Services [phone] Office  [phone] Cell

## 2023-07-09 NOTE — Telephone Encounter (Signed)
CT reader suggested patient is not a good candidate for CCTA due to HR control despite max dose.  Discontinued meds to pharm  Metoprolol 100mg   Ivabradine 15mg   Prednisone 50mg  x 3  Benadryl 50mg  x 1  Rockwell Alexandria RN Navigator Cardiac Imaging [facility] Heart and Vascular Services [phone] Office  [phone] Cell

## 2023-07-10 ENCOUNTER — Ambulatory Visit: Admission: RE | Admit: 2023-07-10 | Payer: Medicaid Other | Source: Ambulatory Visit

## 2023-07-11 ENCOUNTER — Telehealth (HOSPITAL_COMMUNITY): Payer: Self-pay | Admitting: Emergency Medicine

## 2023-07-11 DIAGNOSIS — Z419 Encounter for procedure for purposes other than remedying health state, unspecified: Secondary | ICD-10-CM | POA: Diagnosis not present

## 2023-07-11 NOTE — Telephone Encounter (Signed)
Attempted to call patient regarding upcoming cardiac CT appointment- WHICH NEEDS CANCELLED DUE TO INABILITY TO ADEQUATELY CONTROL HEART RATE. Left message on voicemail with name and callback number Rockwell Alexandria RN Navigator Cardiac Imaging [facility] Heart and Vascular Services [phone] Office [phone] Cell

## 2023-07-14 ENCOUNTER — Ambulatory Visit: Admission: RE | Admit: 2023-07-14 | Payer: Medicaid Other | Source: Ambulatory Visit

## 2023-07-16 DIAGNOSIS — F319 Bipolar disorder, unspecified: Secondary | ICD-10-CM | POA: Diagnosis not present

## 2023-07-16 DIAGNOSIS — F410 Panic disorder [episodic paroxysmal anxiety] without agoraphobia: Secondary | ICD-10-CM | POA: Diagnosis not present

## 2023-07-16 DIAGNOSIS — F411 Generalized anxiety disorder: Secondary | ICD-10-CM | POA: Diagnosis not present

## 2023-07-16 DIAGNOSIS — F4312 Post-traumatic stress disorder, chronic: Secondary | ICD-10-CM | POA: Diagnosis not present

## 2023-07-21 ENCOUNTER — Ambulatory Visit: Admission: RE | Admit: 2023-07-21 | Payer: Medicaid Other | Source: Ambulatory Visit

## 2023-07-22 ENCOUNTER — Ambulatory Visit: Admission: RE | Admit: 2023-07-22 | Payer: Medicaid Other | Source: Ambulatory Visit

## 2023-07-22 IMAGING — CT CT HEAD W/O CM
4 series · 15 of 47 positions shown, 17 images · non-contrast
Comparison: March 14, 2021

CLINICAL DATA: Head trauma, coagulopathy (Age 19-64y)

EXAM:
CT HEAD WITHOUT CONTRAST
TECHNIQUE: Contiguous axial images were obtained from the base of the skull
through the vertex without intravenous contrast.

[Series 2: head wo · axial · 0.47mm/px · z∈[-163,-58]mm · 7 of 29 slices shown, 9 images]
[im 4/29  brain]
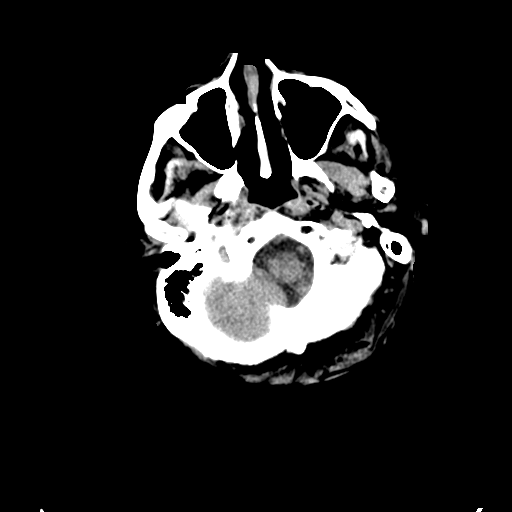
[im 4/29  bone]
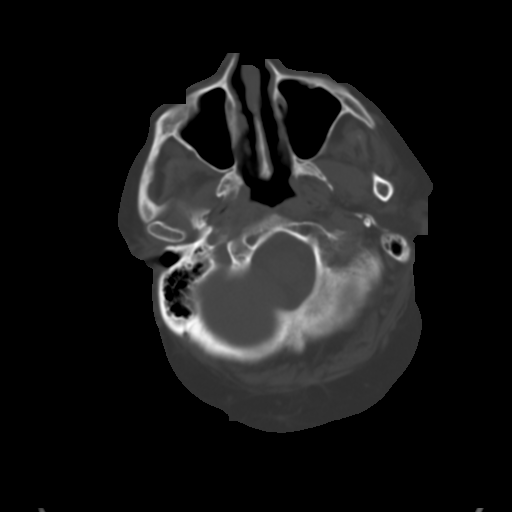
[im 8/29  brain]
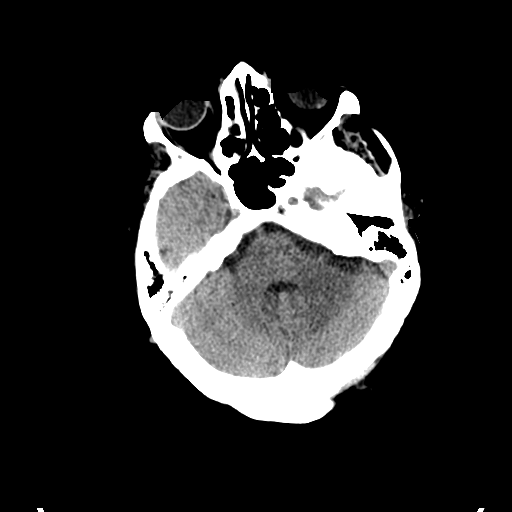
[im 11/29  brain]
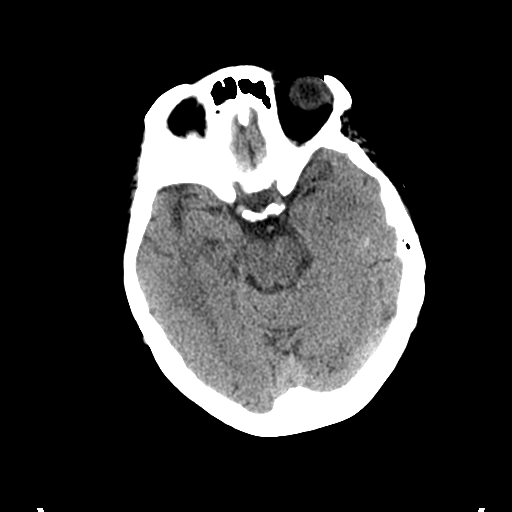
[im 15/29  brain]
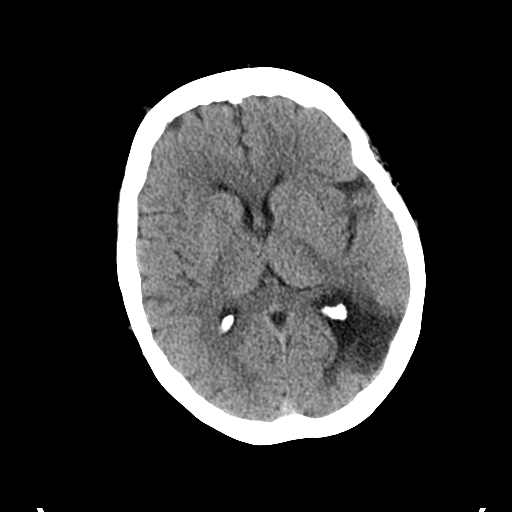
[im 18/29  brain]
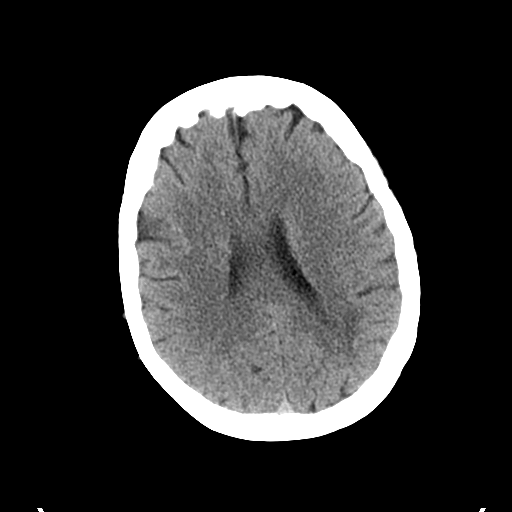
[im 18/29  bone]
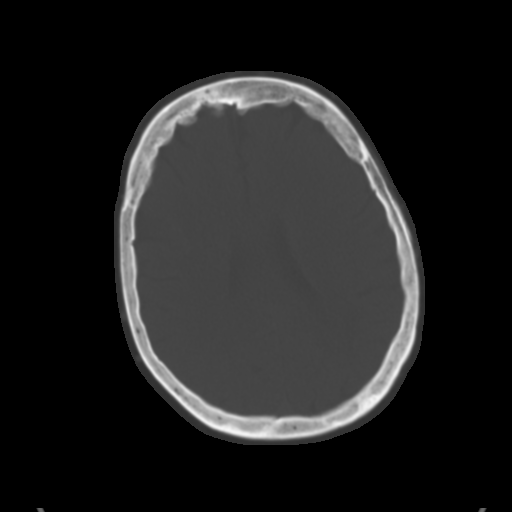
[im 22/29  brain]
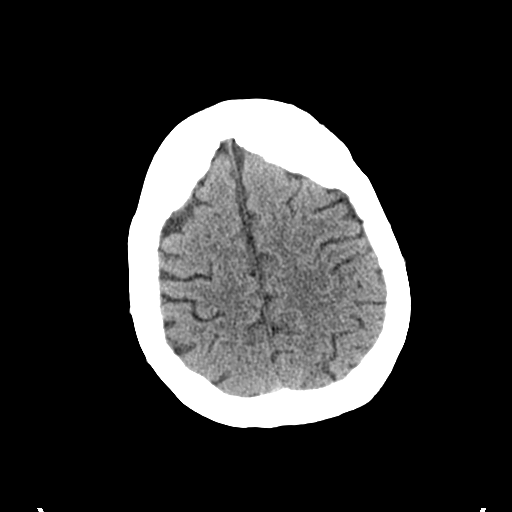
[im 25/29  brain]
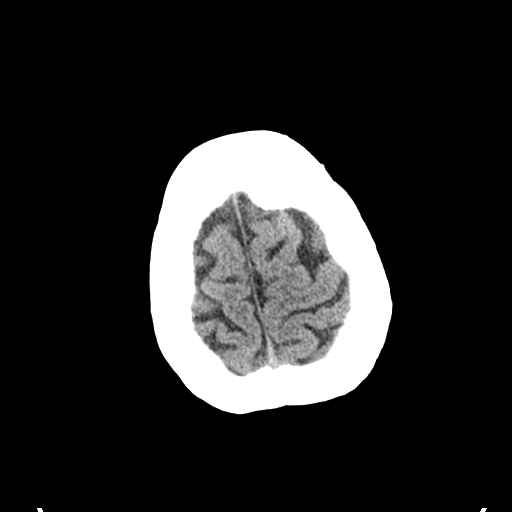

[Series 3: head bone · axial · 0.47mm/px · z∈[-164,-150]mm · 2 of 73 slices shown]
[im 8/73  bone]
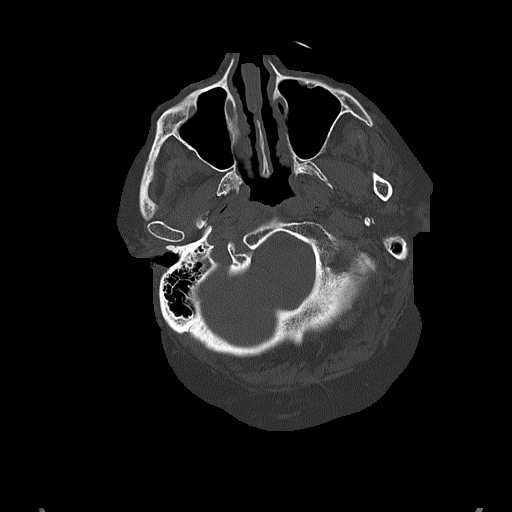
[im 15/73  bone]
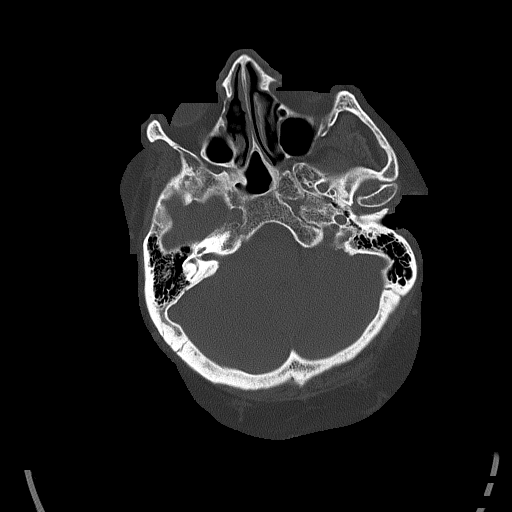

[Series 5: coronal soft tissue · coronal · 0.30mm/px · 3 of 66 slices shown]
[im 22/66  brain]
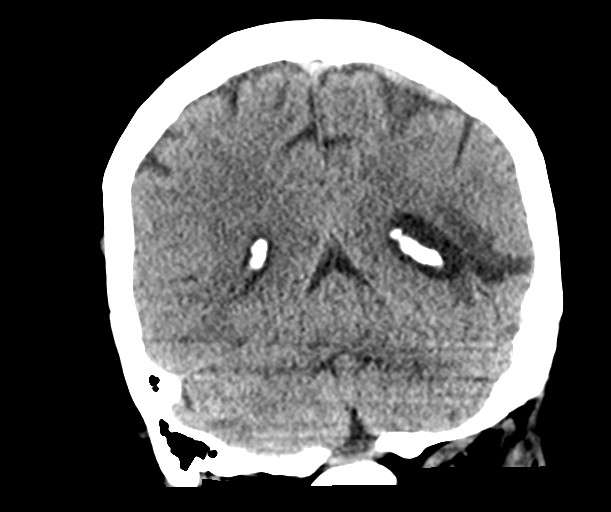
[im 29/66  brain]
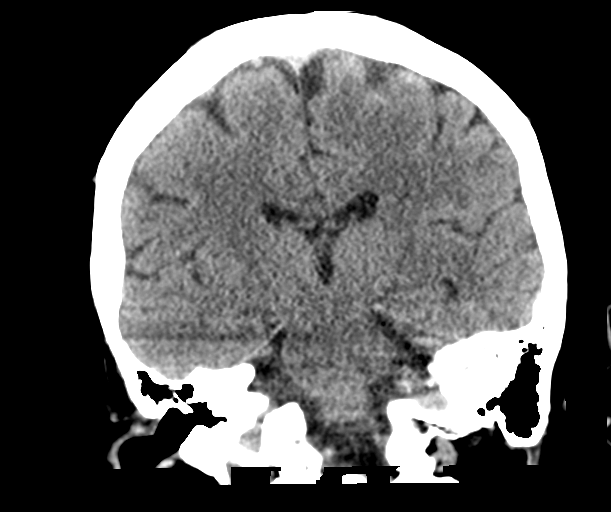
[im 37/66  brain]
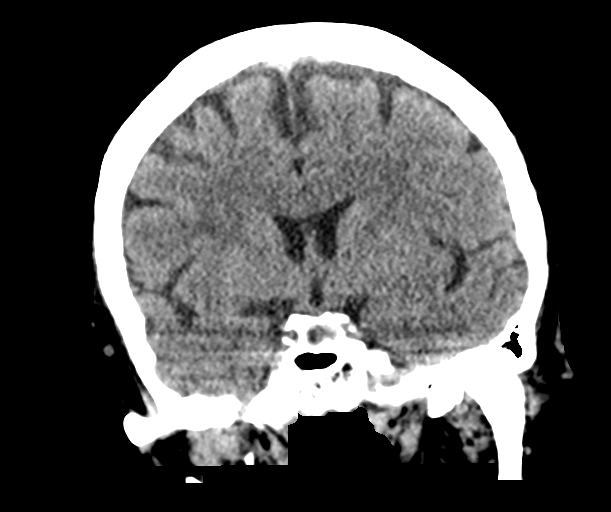

[Series 6: sagittal soft tissue · sagittal · 0.28mm/px · 3 of 60 slices shown]
[im 20/60  brain]
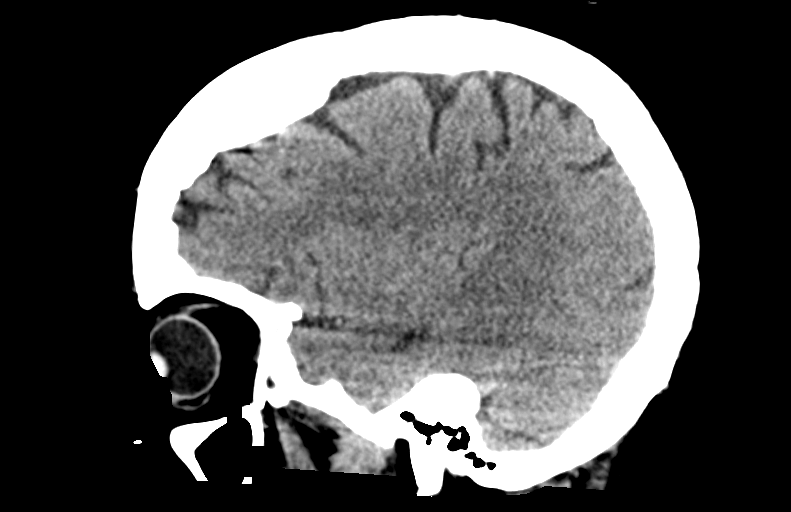
[im 30/60  brain]
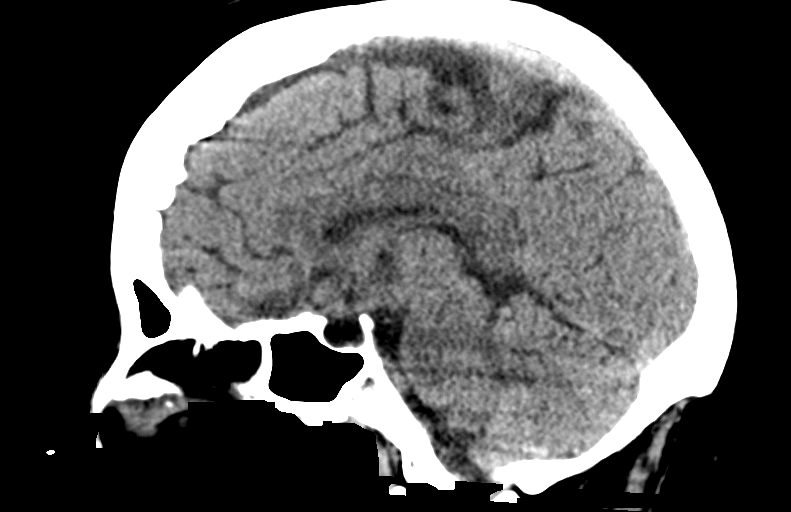
[im 40/60  brain]
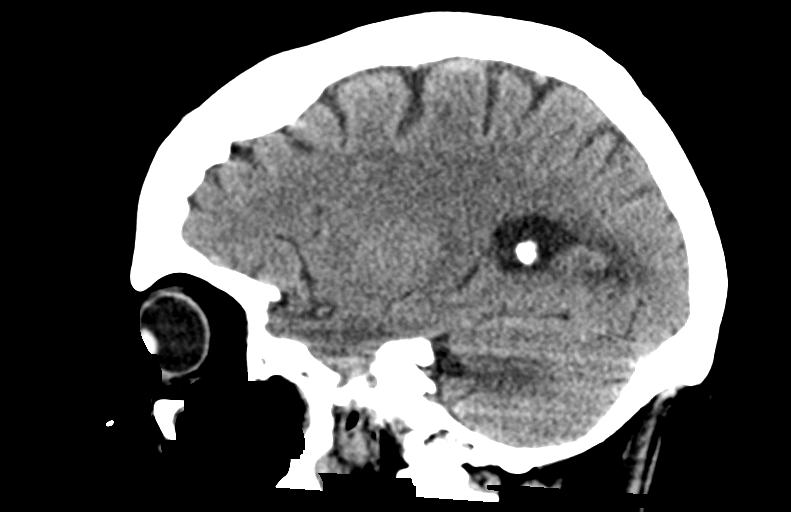

[15 of 47 positions shown; findings below may reference images not displayed]

FINDINGS: Brain: There is no acute intracranial hemorrhage, mass effect, or
edema. No new loss of gray-white differentiation. Chronic infarct of
the posterior left temporal lobe is again noted. There is no
extra-axial fluid collection. Ventricles and sulci are within normal
limits in size and configuration apart from ex vacuo dilatation the
posterior left lateral ventricle.

Vascular: There is atherosclerotic calcification at the skull base.

Skull: Calvarium is unremarkable.

Sinuses/Orbits: Evidence of prior sinonasal surgery. Mucosal
thickening is present neck. Orbits are unremarkable.

Other: Mastoid air cells are clear.
IMPRESSION: No evidence of acute intracranial injury.

## 2023-07-22 IMAGING — CR DG HIP (WITH OR WITHOUT PELVIS) 2-3V*R*
3 series · 3 of 3 positions shown · non-contrast
Comparison: None.

CLINICAL DATA: Fall.

EXAM:
DG HIP (WITH OR WITHOUT PELVIS) 2-3V RIGHT

[w hip lat right]
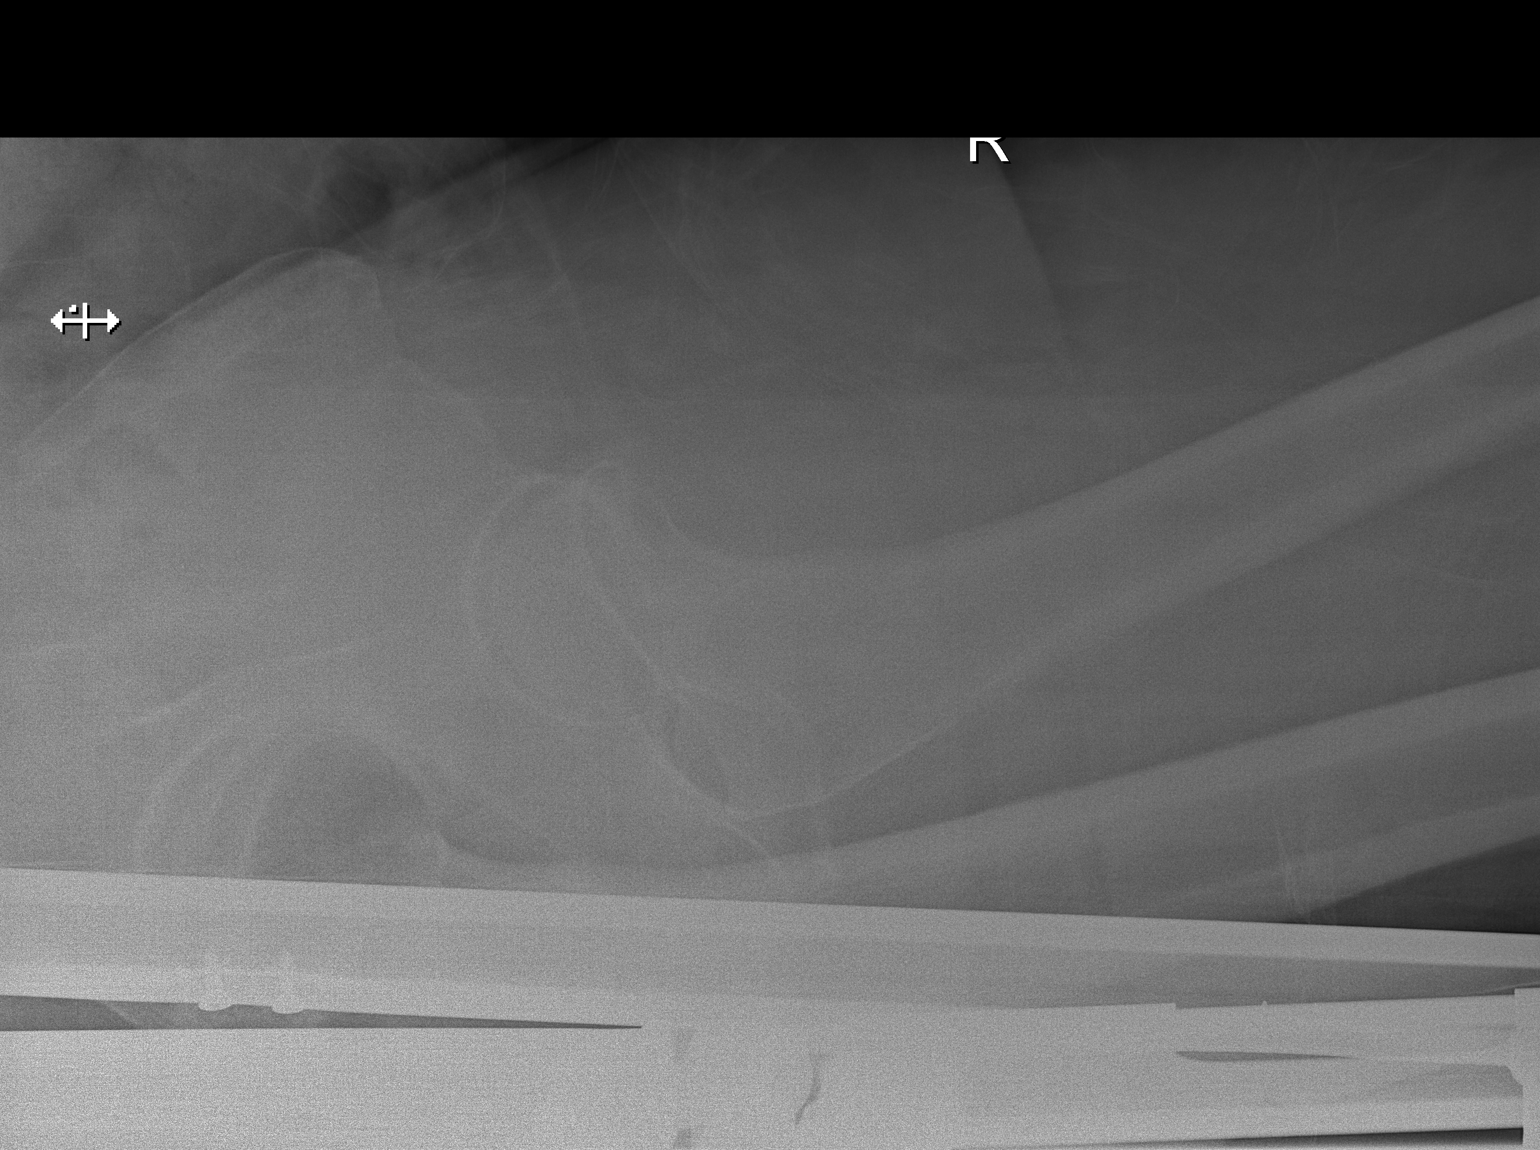

[x pelvis (1 of 2)]
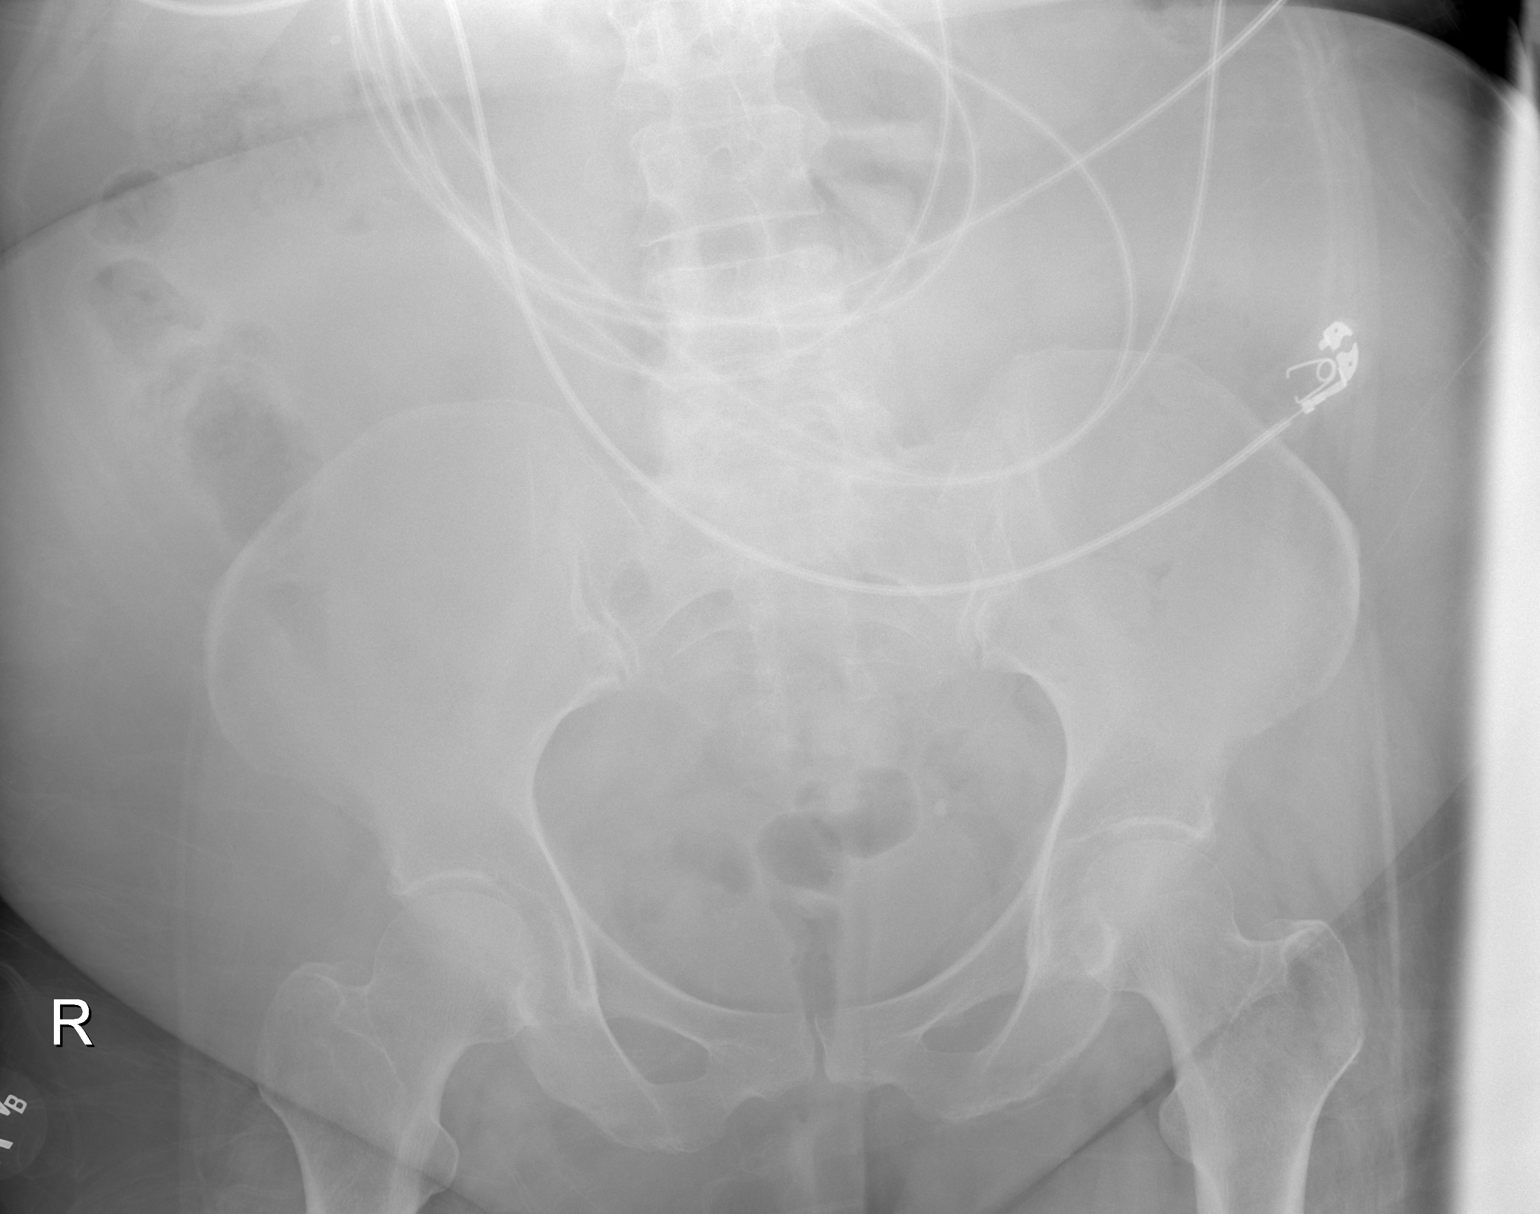

[x pelvis (2 of 2)]
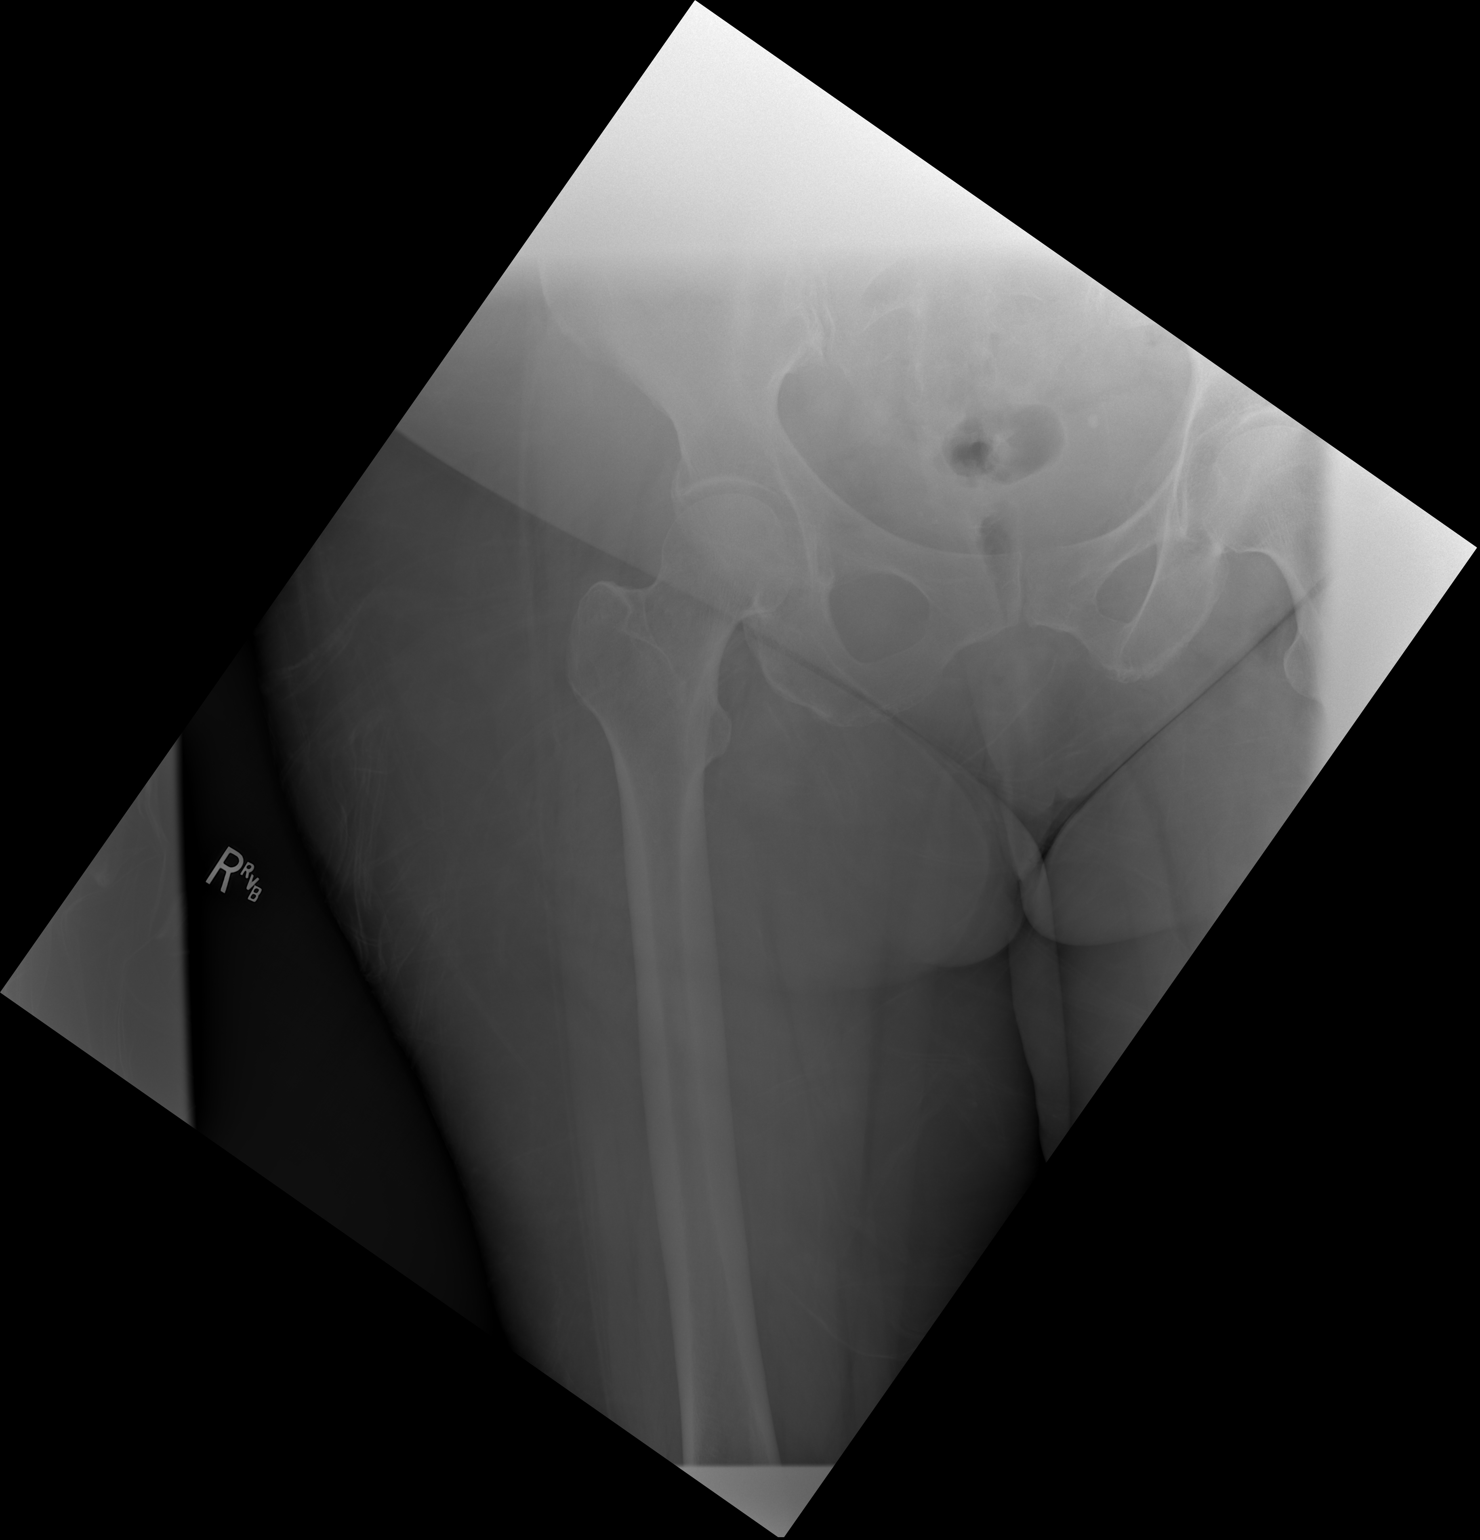

[3 of 3 positions shown; findings below may reference images not displayed]

FINDINGS: There is no evidence of hip fracture or dislocation. There is no
evidence of arthropathy or other focal bone abnormality.
IMPRESSION: Negative.

## 2023-07-22 IMAGING — CT CT CERVICAL SPINE W/O CM
3 of 4 series · 13 of 33 positions shown, 16 images · non-contrast
Comparison: March 2021

CLINICAL DATA: Neck trauma, midline tenderness (Age 16-64y)

EXAM:
CT CERVICAL SPINE WITHOUT CONTRAST
TECHNIQUE: Multidetector CT imaging of the cervical spine was performed without
intravenous contrast. Multiplanar CT image reconstructions were also
generated.

[Series 5: orthogonal bone · axial · 0.23mm/px · z∈[-320,-206]mm · 5 of 89 slices shown, 7 images]
[im 15/89  soft-tissue]
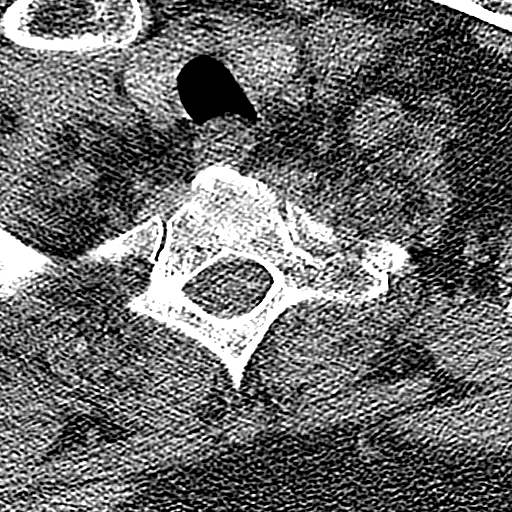
[im 15/89  bone]
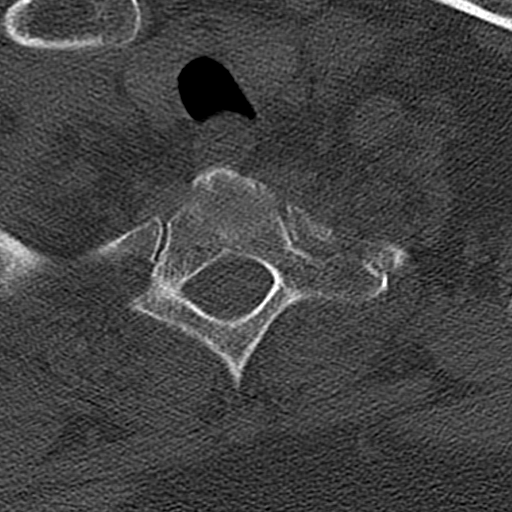
[im 30/89  bone]
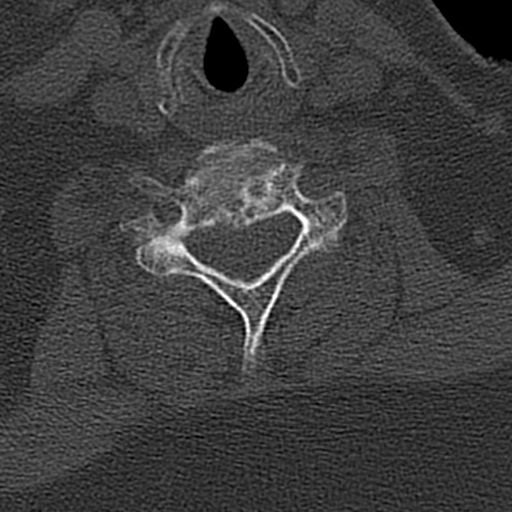
[im 45/89  bone]
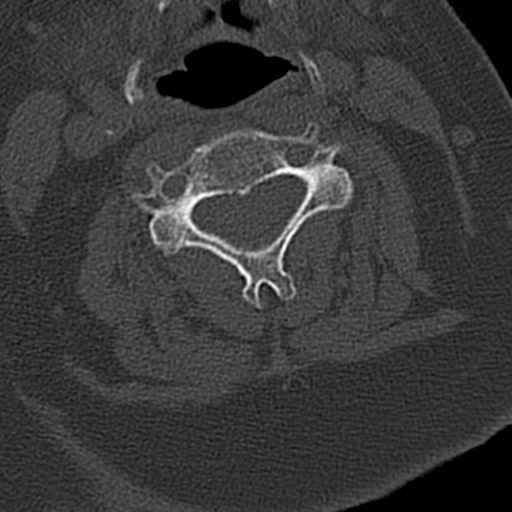
[im 59/89  bone]
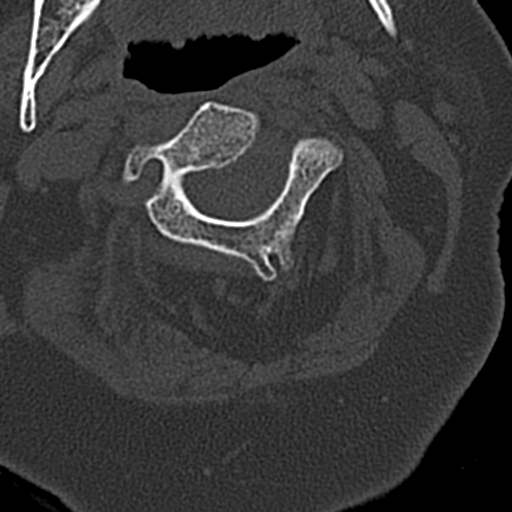
[im 74/89  soft-tissue]
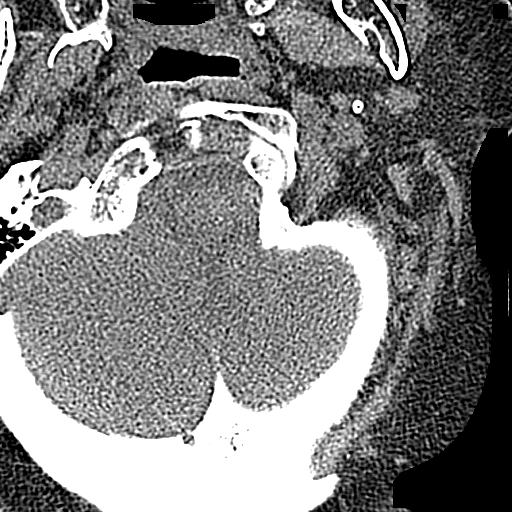
[im 74/89  bone]
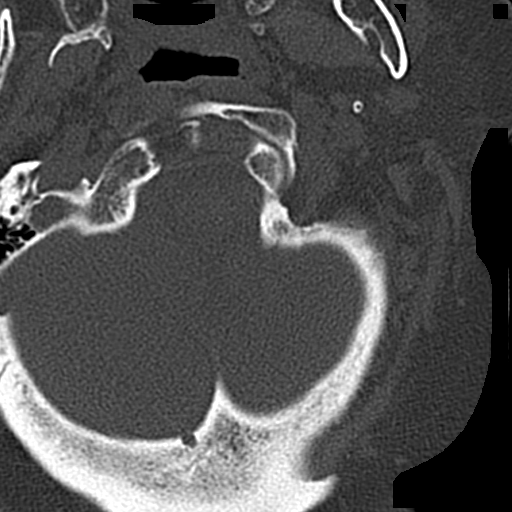

[Series 6: coronal bone · coronal · 0.24mm/px · 3 of 61 slices shown]
[im 13/61  bone]
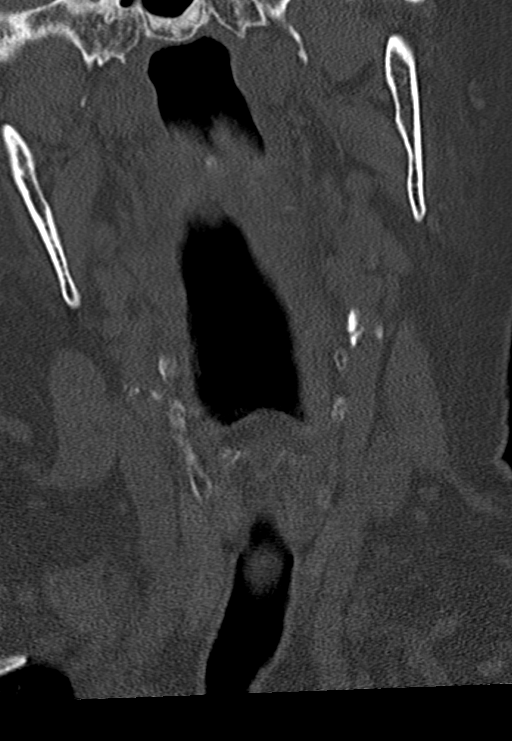
[im 25/61  bone]
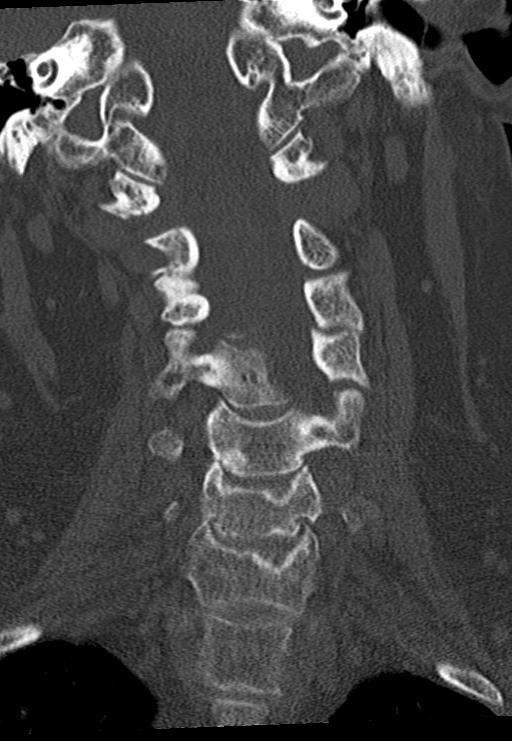
[im 37/61  bone]
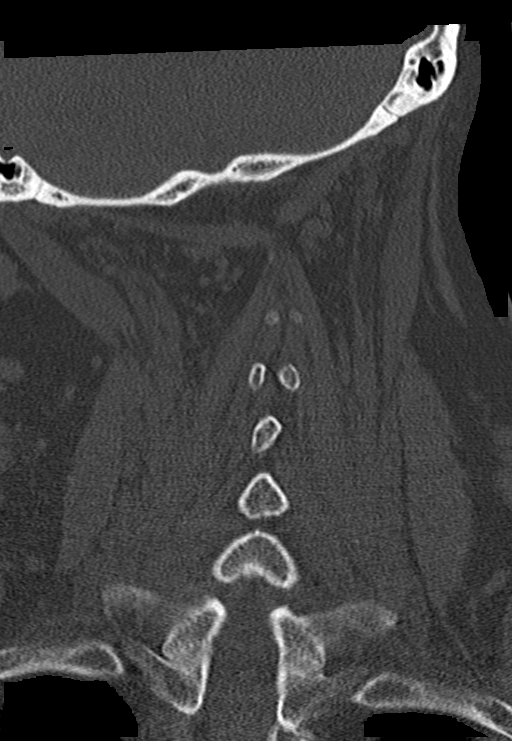

[Series 7: sagittal bone · sagittal · 0.30mm/px · 5 of 61 slices shown, 6 images]
[im 21/61  bone]
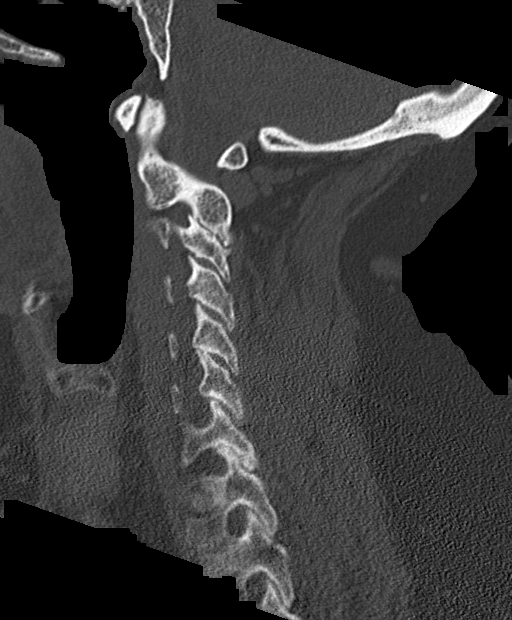
[im 26/61  bone]
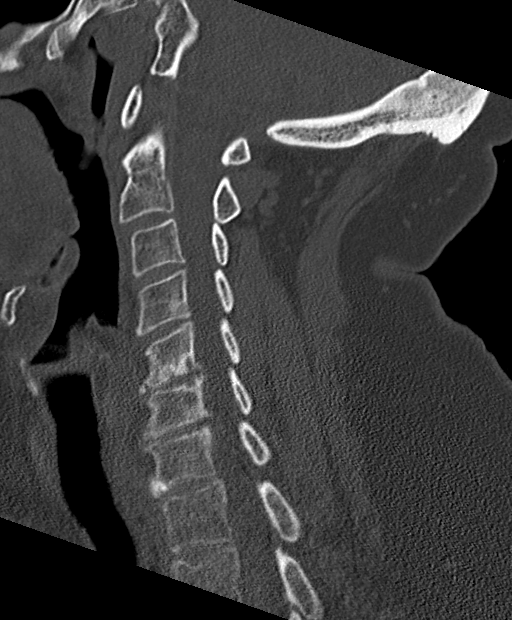
[im 31/61  soft-tissue]
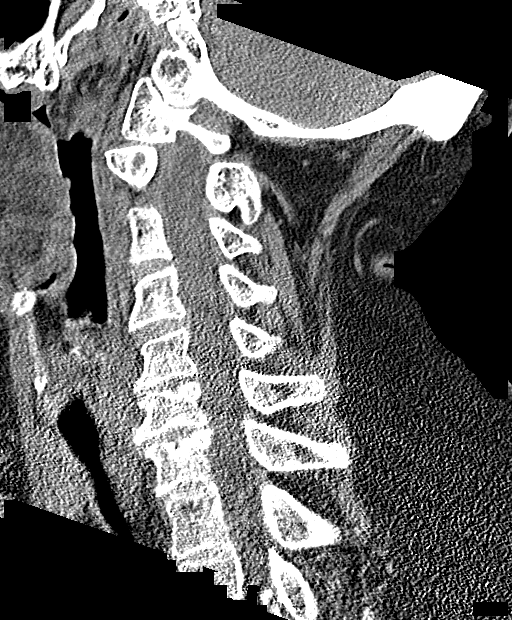
[im 31/61  bone]
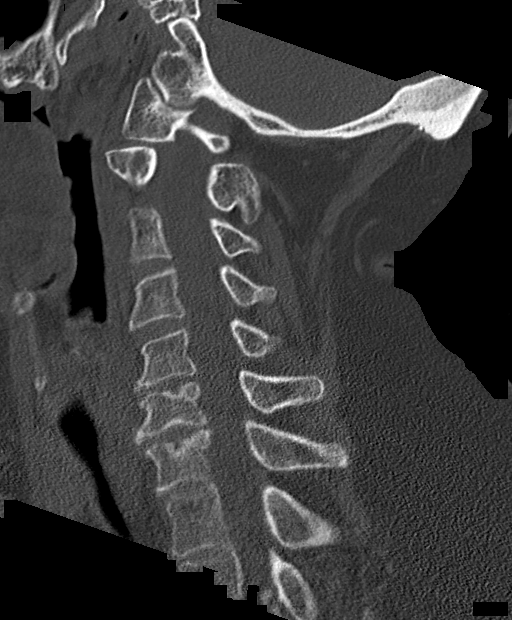
[im 36/61  bone]
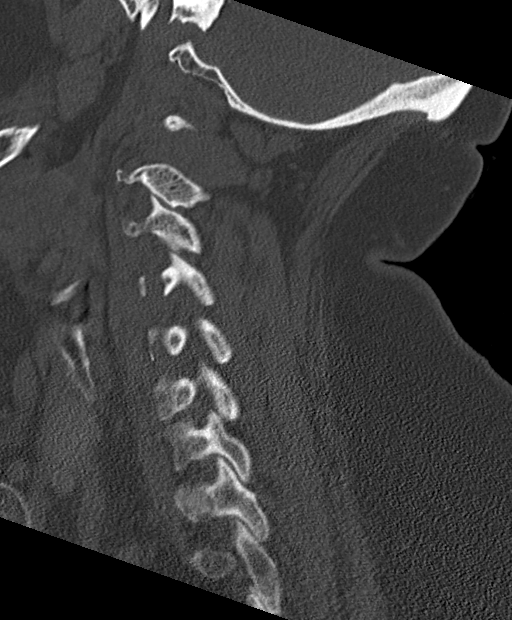
[im 41/61  bone]
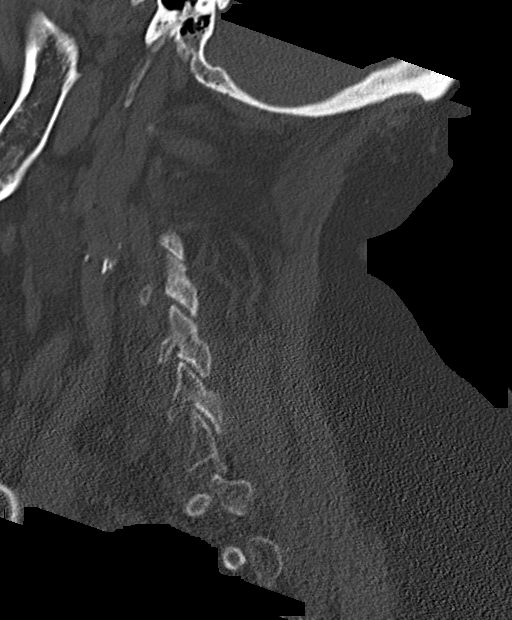

[13 of 33 positions shown; findings below may reference images not displayed]

FINDINGS: Alignment: No significant listhesis.

Skull base and vertebrae: Stable vertebral body heights with lower
cervical degenerative endplate irregularity. No acute fracture.

Soft tissues and spinal canal: No prevertebral fluid or swelling. No
visible canal hematoma.

Disc levels: Stable degenerative changes. No high-grade osseous
encroachment on the spinal canal or neural foramina.

Upper chest: Included lung apices are clear.

Other: Calcified plaque at the common carotid bifurcations.
IMPRESSION: No acute cervical spine fracture.

## 2023-07-22 IMAGING — CR DG CHEST 2V
3 series · 3 of 3 positions shown · non-contrast
Comparison: 07/22/2020

CLINICAL DATA: Fall

EXAM:
CHEST - 2 VIEW

[x chest ap (1 of 2)]
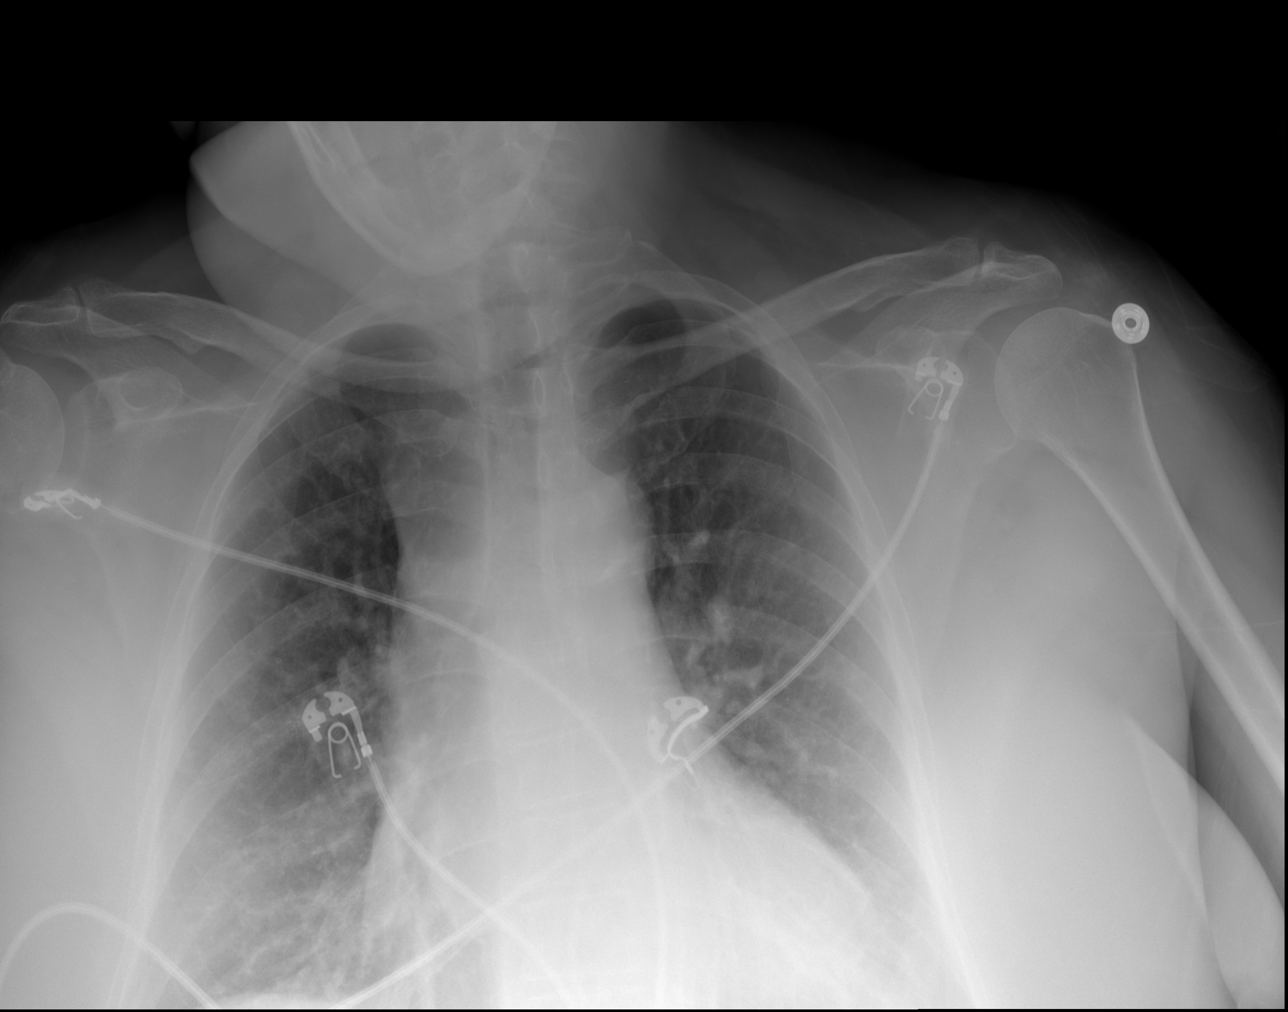

[x chest ap (2 of 2)]
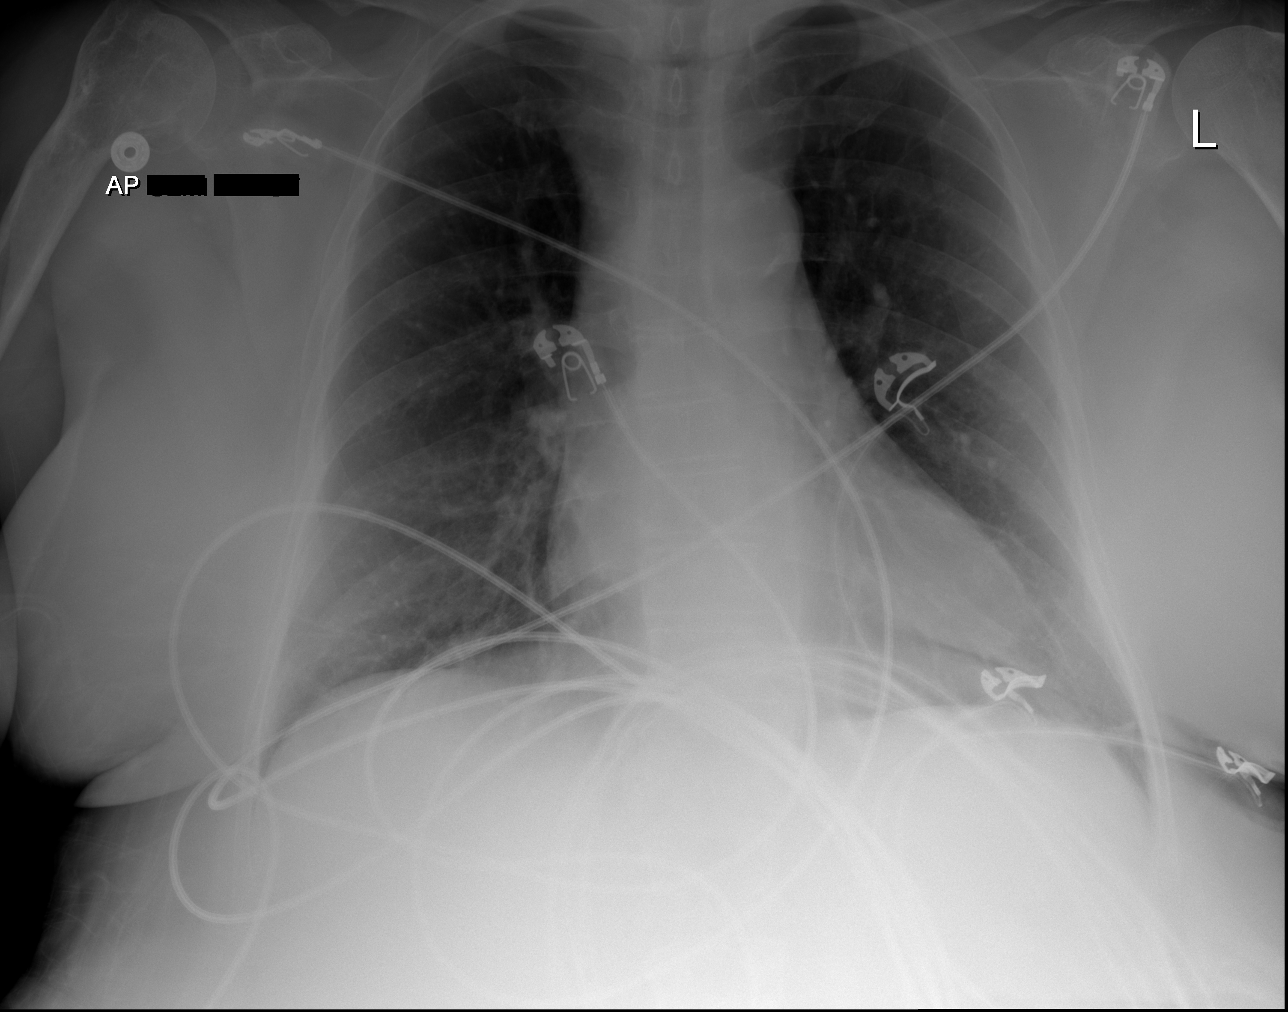

[w chest lat]
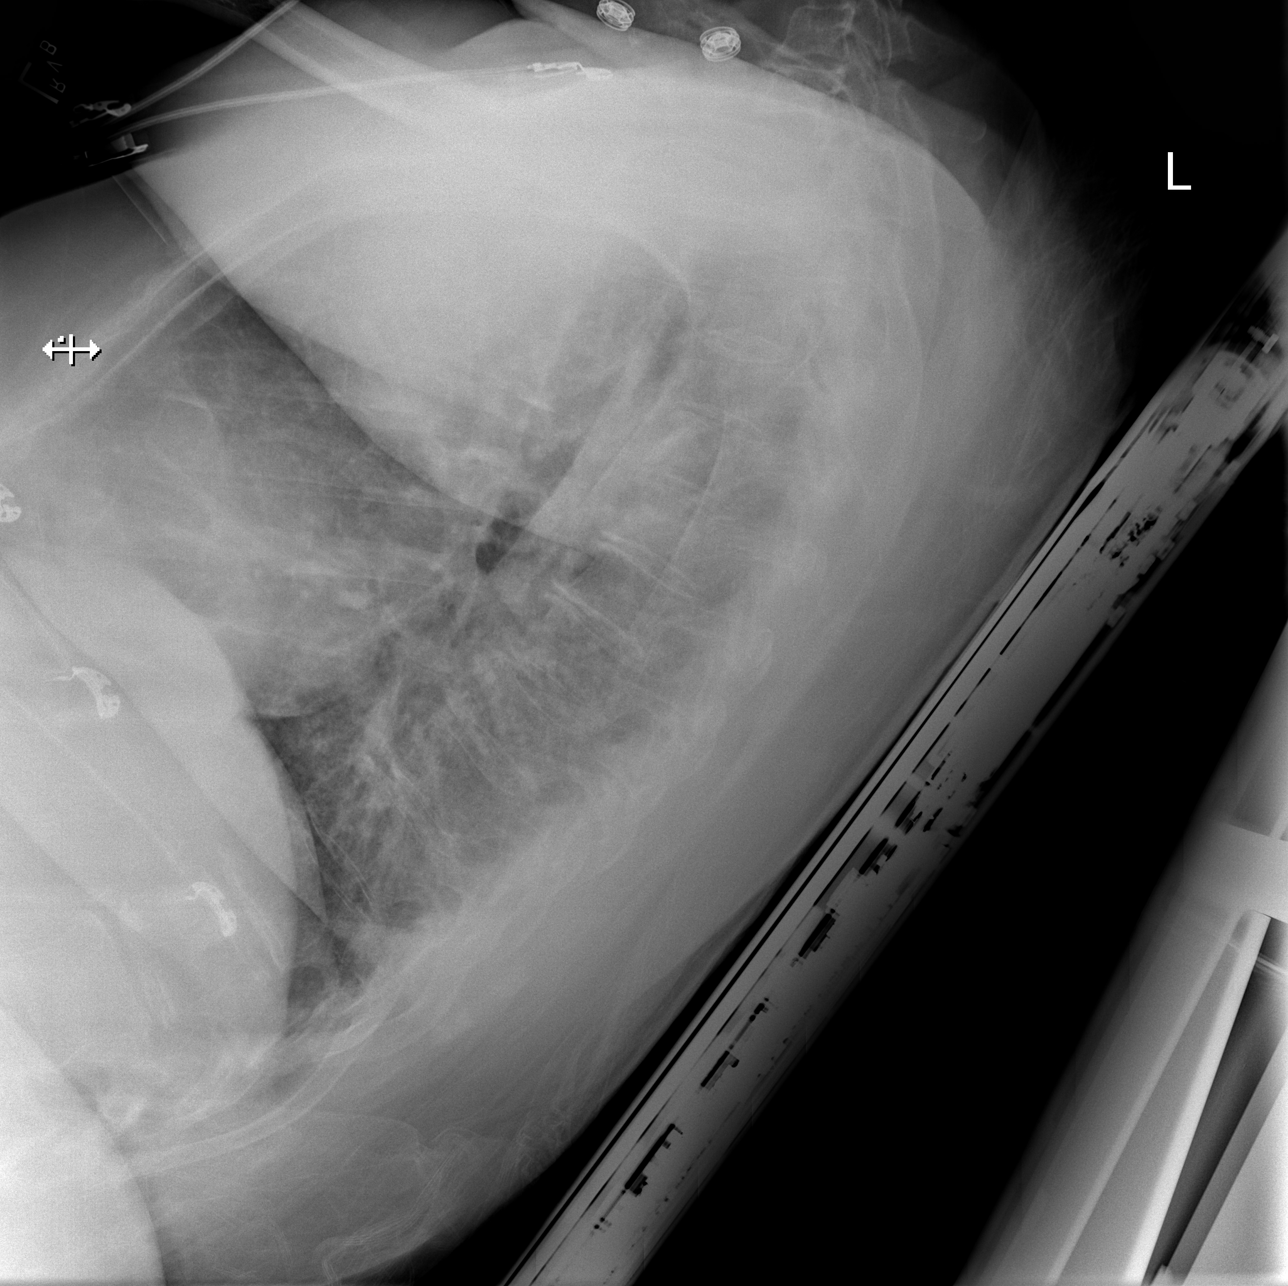

[3 of 3 positions shown; findings below may reference images not displayed]

FINDINGS: The heart size and mediastinal contours are within normal limits.
Both lungs are clear. The visualized skeletal structures are
unremarkable.
IMPRESSION: No active cardiopulmonary disease.

## 2023-07-23 ENCOUNTER — Ambulatory Visit: Payer: Medicaid Other | Admitting: Internal Medicine

## 2023-07-28 ENCOUNTER — Ambulatory Visit: Payer: Medicaid Other | Admitting: Internal Medicine

## 2023-07-30 ENCOUNTER — Telehealth (INDEPENDENT_AMBULATORY_CARE_PROVIDER_SITE_OTHER): Payer: Medicaid Other | Admitting: Physician Assistant

## 2023-07-30 ENCOUNTER — Ambulatory Visit: Payer: Self-pay | Admitting: *Deleted

## 2023-07-30 DIAGNOSIS — I10 Essential (primary) hypertension: Secondary | ICD-10-CM | POA: Diagnosis not present

## 2023-07-30 DIAGNOSIS — H539 Unspecified visual disturbance: Secondary | ICD-10-CM

## 2023-07-30 DIAGNOSIS — M7989 Other specified soft tissue disorders: Secondary | ICD-10-CM

## 2023-07-30 DIAGNOSIS — R0789 Other chest pain: Secondary | ICD-10-CM

## 2023-07-30 NOTE — Telephone Encounter (Signed)
  Chief Complaint: elevated BP Symptoms: elevated BP, headache Frequency: today Pertinent Negatives: Patient denies chest pain, SOB Disposition: [x] ED /[] Urgent Care (no appt availability in office) / [] Appointment(In office/virtual)/ []  [place] [facility]/ [] Home Care/ [] Refused Recommended Disposition /[] [place] [facility]/ []  Follow-up with PCP Additional Notes: At end of call patient did admit she is seeing "dots" advice changed- - ED or 911 for evaluation strongly encouraged for patient symptoms.   Reason for Disposition  [1] Systolic BP  >= 160 OR Diastolic >= 100 AND [2] cardiac (e.g., breathing difficulty, chest pain) or neurologic symptoms (e.g., new-onset blurred or double vision, unsteady gait)  Answer Assessment - Initial Assessment Questions 1. BLOOD PRESSURE: "What is the blood pressure?" "Did you take at least two measurements 5 minutes apart?"     155/110 P 99,  163/119 P100 2. ONSET: "When did you take your blood pressure?"     8:45,  9:00 3. HOW: "How did you take your blood pressure?" (e.g., automatic home BP monitor, visiting nurse)     arm 4. HISTORY: "Do you have a history of high blood pressure?"     yes 5. MEDICINES: "Are you taking any medicines for blood pressure?" "Have you missed any doses recently?"     Yes- no missed doses 6. OTHER SYMPTOMS: "Do you have any symptoms?" (e.g., blurred vision, chest pain, difficulty breathing, headache, weakness)     Headache- yesterday-no transportation to office- offered virtual visit. During call patient admitted she is seeing "dots" - changed advice- she needs ED- or call EMS if alone and no one to take her to ED  Protocols used: Blood Pressure - High-A-AH

## 2023-07-30 NOTE — Progress Notes (Signed)
MyChart Video Visit  Virtual Visit via Video Note   This format is felt to be most appropriate for this patient at this time. Physical exam was limited by quality of the video and audio technology used for the visit.   Patient location: office Patient Location: Home  I discussed the limitations of evaluation and management by telemedicine and the availability of in person appointments. The patient expressed understanding and agreed to proceed.  Patient: Stephanie [name]   DOB: 1965-11-12   57 y.o. Female  MRN: [id number] Visit Date: 07/30/2023  Today's healthcare provider: Debera Lat, PA-C   No chief complaint on file.  Subjective    HPI  Patient with significant medical history of uncontrolled hypertension, migraines, hx of intracerebral hemorrhage and diabetes present to our clinic/VV with complaints of elevated blood pressure. Patient was seen for elevated blood pressure on 06/30/2023 by Dr. Caralee Ates.  Patient was advised to continue amlodipine 2.5 Mg, Lasix 40 Mg, was started on lisinopril 10 Mg  Per chart review, cardiac imaging and prescription for metoprolol, ivabradine, prednisone and bendryl were cancelled due to inability to adequately control heart rate? On 07/09/23  Per triage note, pt contacted clinic for elevated BP and headache on 07/30/23 with complaints on seeing "dots". She endorses having BP of 155/110 with HR of 99 and 163/119 with HR of 100 Tuesday morning. She had a headache on Monday but not at the day of VV. Pt was advised to go to ED, pt declined due to transportation problems  Patient reports that she has been having visual disturbances before and it is not a recent occurrence? During the visit, her bp was 145/84 with HR of 88. She reports that she is feeling much better and takes her BP meds However, she could not remember what BP medications she has been taking.  Medications: Outpatient Medications Prior to Visit  Medication Sig   albuterol (VENTOLIN  HFA) 108 (90 Base) MCG/ACT inhaler INHALE 2 PUFFS BY MOUTH EVERY 6 HOURS AS NEEDED FOR WHEEZING OR SHORTNESS OF BREATH   amLODipine (NORVASC) 2.5 MG tablet Take 1 tablet (2.5 mg total) by mouth daily.   ARIPiprazole (ABILIFY) 15 MG tablet Take 15 mg by mouth every morning.   clopidogrel (PLAVIX) 75 MG tablet Take 75 mg by mouth daily.   Continuous Glucose Receiver (FREESTYLE LIBRE 2 READER) DEVI Patient lost her's (Patient not taking: Reported on 06/11/2023)   Continuous Glucose Sensor (FREESTYLE LIBRE 2 SENSOR) MISC 1 each by Does not apply route every 14 (fourteen) days. (Patient not taking: Reported on 06/11/2023)   diazepam (VALIUM) 10 MG tablet Take 10 mg by mouth 3 (three) times daily.   fluticasone-salmeterol (ADVAIR) 100-50 MCG/ACT AEPB Inhale 1 puff into the lungs 2 (two) times daily.   furosemide (LASIX) 40 MG tablet TAKE 1 TABLET BY MOUTH TWICE DAILY AS NEEDED FOR EDEMA IN THE MORNING. CAN TAKE ADDITIONAL TABLET IN THE AFTERNOON.   lamoTRIgine (LAMICTAL) 100 MG tablet Take 100 mg by mouth 2 (two) times daily.   lisinopril (ZESTRIL) 10 MG tablet Take 1 tablet (10 mg total) by mouth daily.   metFORMIN (GLUCOPHAGE-XR) 500 MG 24 hr tablet Take 1 tablet (500 mg total) by mouth 2 (two) times daily with a meal.   omeprazole (PRILOSEC) 40 MG capsule Take 1 capsule (40 mg total) by mouth daily.   potassium chloride SA (KLOR-CON M) 20 MEQ tablet TAKE 1 TABLET BY MOUTH TWICE DAILY FOR 3 DAYS THEN TAKE 1 TABLET BY MOUTH DAILY  WITH LASIX   promethazine (PHENERGAN) 12.5 MG tablet Take 1 tablet (12.5 mg total) by mouth every 8 (eight) hours as needed for nausea or vomiting.   Semaglutide,0.25 or 0.5MG /DOS, (OZEMPIC, 0.25 OR 0.5 MG/DOSE,) 2 MG/3ML SOPN Inject 0.25 mg into the skin once a week.   traZODone (DESYREL) 150 MG tablet Take by mouth at bedtime.   Vilazodone HCl (VIIBRYD) 40 MG TABS Take 40 mg by mouth daily.   No facility-administered medications prior to visit.    Review of Systems  All  other systems reviewed and are negative.  Except see HPI       Objective    There were no vitals taken for this visit.      Physical Exam Constitutional:      General: She is not in acute distress.    Appearance: Normal appearance.  HENT:     Head: Normocephalic.  Pulmonary:     Effort: Pulmonary effort is normal. No respiratory distress.  Neurological:     Mental Status: She is alert and oriented to person, place, and time. Mental status is at baseline.        Assessment & Plan    Due to technical difficulties, were not able to see patient Cannot observe patient during the visit.  1. Primary hypertension Chronic and unstable However, by the time of appointment 's blood pressure dropped to 146/94 with HR of 88. Unclear, if she has been taking her medications. Pt agreed to do blood work Endorses having chronic leg swelling and chronic? Visual disturbances She is a current everyday smoker. In the setting of complicated cardiac history , ordered: - Troponin T - Pro b natriuretic peptide (BNP)[facility]) - CBC with Differential/Platelet - Comprehensive Metabolic Panel (CMET) - D-dimer Advised to increase amlodipine to 5mg  , continue taking lisinopril 10mg  as well as take her furosemide 80 mg today instead of 40mg  If BP will stay high or pt will continue to experience symptoms including visual, advised to go to ED Pt agreed and expressed her understanding Advised to bring her BP log, her medications and BP devise to her follow-up appointment Needs EKG and physical exam, strongly advised to follow-up with primary. This Friday. No follow-ups on file.     I discussed the assessment and treatment plan with the patient. The patient was provided an opportunity to ask questions and all were answered. The patient agreed with the plan and demonstrated an understanding of the instructions.   The patient was advised to call back or seek an in-person  evaluation if the symptoms worsen or if the condition fails to improve as anticipated.  I provided 10 minutes of non-face-to-face time during this encounter.  I, Debera Lat, PA-C have reviewed all documentation for this visit. The documentation on  07/31/23  for the exam, diagnosis, procedures, and orders are all accurate and complete.  Debera Lat, [facility], MMS [facility] [phone] (phone) [phone] (fax)  [facility]

## 2023-08-01 ENCOUNTER — Encounter: Payer: Self-pay | Admitting: Physician Assistant

## 2023-08-01 ENCOUNTER — Encounter: Payer: Self-pay | Admitting: Internal Medicine

## 2023-08-01 ENCOUNTER — Ambulatory Visit (INDEPENDENT_AMBULATORY_CARE_PROVIDER_SITE_OTHER): Payer: Medicaid Other | Admitting: Internal Medicine

## 2023-08-01 VITALS — BP 138/92 | HR 106 | Temp 98.0°F | Resp 18 | Ht 62.0 in | Wt 224.0 lb

## 2023-08-01 DIAGNOSIS — E1165 Type 2 diabetes mellitus with hyperglycemia: Secondary | ICD-10-CM | POA: Diagnosis not present

## 2023-08-01 DIAGNOSIS — I10 Essential (primary) hypertension: Secondary | ICD-10-CM

## 2023-08-01 DIAGNOSIS — R079 Chest pain, unspecified: Secondary | ICD-10-CM | POA: Diagnosis not present

## 2023-08-01 DIAGNOSIS — Z7984 Long term (current) use of oral hypoglycemic drugs: Secondary | ICD-10-CM

## 2023-08-01 DIAGNOSIS — R001 Bradycardia, unspecified: Secondary | ICD-10-CM | POA: Diagnosis not present

## 2023-08-01 DIAGNOSIS — R0602 Shortness of breath: Secondary | ICD-10-CM | POA: Diagnosis not present

## 2023-08-01 DIAGNOSIS — R296 Repeated falls: Secondary | ICD-10-CM | POA: Diagnosis not present

## 2023-08-01 DIAGNOSIS — Z8673 Personal history of transient ischemic attack (TIA), and cerebral infarction without residual deficits: Secondary | ICD-10-CM | POA: Diagnosis not present

## 2023-08-01 DIAGNOSIS — G4733 Obstructive sleep apnea (adult) (pediatric): Secondary | ICD-10-CM | POA: Diagnosis not present

## 2023-08-01 DIAGNOSIS — E669 Obesity, unspecified: Secondary | ICD-10-CM | POA: Diagnosis not present

## 2023-08-01 DIAGNOSIS — E119 Type 2 diabetes mellitus without complications: Secondary | ICD-10-CM | POA: Diagnosis not present

## 2023-08-01 DIAGNOSIS — K219 Gastro-esophageal reflux disease without esophagitis: Secondary | ICD-10-CM | POA: Diagnosis not present

## 2023-08-01 DIAGNOSIS — Z7689 Persons encountering health services in other specified circumstances: Secondary | ICD-10-CM | POA: Diagnosis not present

## 2023-08-01 LAB — POCT GLYCOSYLATED HEMOGLOBIN (HGB A1C): Hemoglobin A1C: 8.9 % — AB (ref 4.0–5.6)

## 2023-08-01 MED ORDER — OZEMPIC (0.25 OR 0.5 MG/DOSE) 2 MG/3ML ~~LOC~~ SOPN
0.2500 mg | PEN_INJECTOR | SUBCUTANEOUS | 0 refills | Status: DC
Start: 2023-08-01 — End: 2023-09-16

## 2023-08-01 NOTE — Progress Notes (Signed)
Acute Office Visit  Subjective:     [name]: Stephanie [name], female    DOB: 1966/05/22, 57 y.o.   MRN: [id number]  Chief Complaint  Patient presents with   Hypertension    Hypertension Pertinent negatives include no blurred vision or shortness of breath.   Patient is in today for elevated blood pressure.   Hypertension: -Medications: Lisinopril 10 mg, Amlodipine 2.5 mg, Lasix 40 mg per her list but patient uncertain what medications she is taking for blood pressure. She was seen by Dr. Juliann Pares in October, who has Metoprolol, Losartan on her list as well but the patient states someone else took her off of them but she is not sure what she is actually taking -Patient is compliant with above medications and reports no side effects. -Denies any SOB, CP, vision changes, LE edema or symptoms of hypotension -Diet: eating less calories  -Exercise: recently working out at [employer], lost 11 pounds  Diabetes, Type 2: -Last A1c 8/24 7.2% -Medications: Metformin 500 mg BID (had been on Glipizide in the past). Had been prescribed Actos but not currently taking. Tried to prescribe Rybelsus in the past but cost was too much. Also recently tried to prescribe Ozempic but insurance would not cover -Patient is compliant with the above medications and reports no side effects. Diarrhea stopped since switching to Metformin ER -Checking BG at home: 230-300 -Eye exam: Due - patient will call to schedule -Foot exam: Due -Microalbumin: Due  -Statin: uncertain if taking -PNA vaccine: UTD -Denies symptoms of hypoglycemia, polyuria, polydipsia, numbness extremities, foot ulcers/trauma.  Review of Systems  Constitutional:  Negative for chills and fever.  Eyes:  Negative for blurred vision.  Respiratory:  Negative for shortness of breath.   Neurological:  Negative for dizziness.        Objective:    BP (!) 142/98   Pulse (!) 106   Temp 98 F (36.7 C)   Resp 18   Ht 5\' 2"  (1.575 m)    Wt 224 lb (101.6 kg)   SpO2 95%   BMI 40.97 kg/m  BP Readings from Last 3 Encounters:  08/01/23 (!) 142/98  06/30/23 (!) 150/100  06/18/23 120/62   Wt Readings from Last 3 Encounters:  08/01/23 224 lb (101.6 kg)  06/30/23 220 lb 11.2 oz (100.1 kg)  06/11/23 228 lb 3.2 oz (103.5 kg)      Physical Exam Constitutional:      Appearance: Normal appearance.  HENT:     Head: Normocephalic and atraumatic.  Eyes:     Conjunctiva/sclera: Conjunctivae normal.  Cardiovascular:     Rate and Rhythm: Normal rate and regular rhythm.  Pulmonary:     Effort: Pulmonary effort is normal.     Breath sounds: Normal breath sounds.  Skin:    General: Skin is warm and dry.  Neurological:     General: No focal deficit present.     Mental Status: She is alert. Mental status is at baseline.  Psychiatric:        Mood and Affect: Mood normal.        Behavior: Behavior normal.     No results found for any visits on 08/01/23.      Assessment & Plan:   1. Type 2 diabetes mellitus with hyperglycemia, without long-term current use of insulin (HCC): A1c elevated at 8.9%. Patient is uncertain what medications she is taking but is taking Metformin 500 mg XR BID. Will order Ozempic 0.25 mg weekly as she  has Medicaid and it should be covered. Advised patient to call insurance.   - POCT HgB A1C - Semaglutide,0.25 or 0.5MG /DOS, (OZEMPIC, 0.25 OR 0.5 MG/DOSE,) 2 MG/3ML SOPN; Inject 0.25 mg into the skin once a week.  Dispense: 3 mL; Refill: 0  2. Primary hypertension: Blood pressure borderline here today but patient does not know what medications she is on and her medication list is different at every provider office. I cannot make safe recommendations without knowing what she is taking. We attempted to call her pharmacy to see what she is filling but did not get an answer. She will have to bring all of her medications to her next appointment.    Return if symptoms worsen or fail to improve.  Margarita Mail, DO

## 2023-08-01 NOTE — Patient Instructions (Signed)
It was great seeing you today!  Plan discussed at today's visit: -A1c 8.9%, I am sending in another prescription for Ozempic but please call you insurance to see why this was not able to be covered previously -Regarding blood pressure, please find out all the medications you are taking for blood pressure and either send me a mychart message or call with your list  Follow up in: as needed  Take care and let us know if you have any questions or concerns prior to your next visit.  Dr. Caralee Ates

## 2023-08-04 ENCOUNTER — Ambulatory Visit: Payer: Self-pay

## 2023-08-04 IMAGING — CR DG SHOULDER 2+V*L*
3 series · 3 of 3 positions shown · non-contrast
Comparison: None.

CLINICAL DATA: Left shoulder pain.  No other history provided.

EXAM:
LEFT SHOULDER - 2+ VIEW

[w shoulder external left]
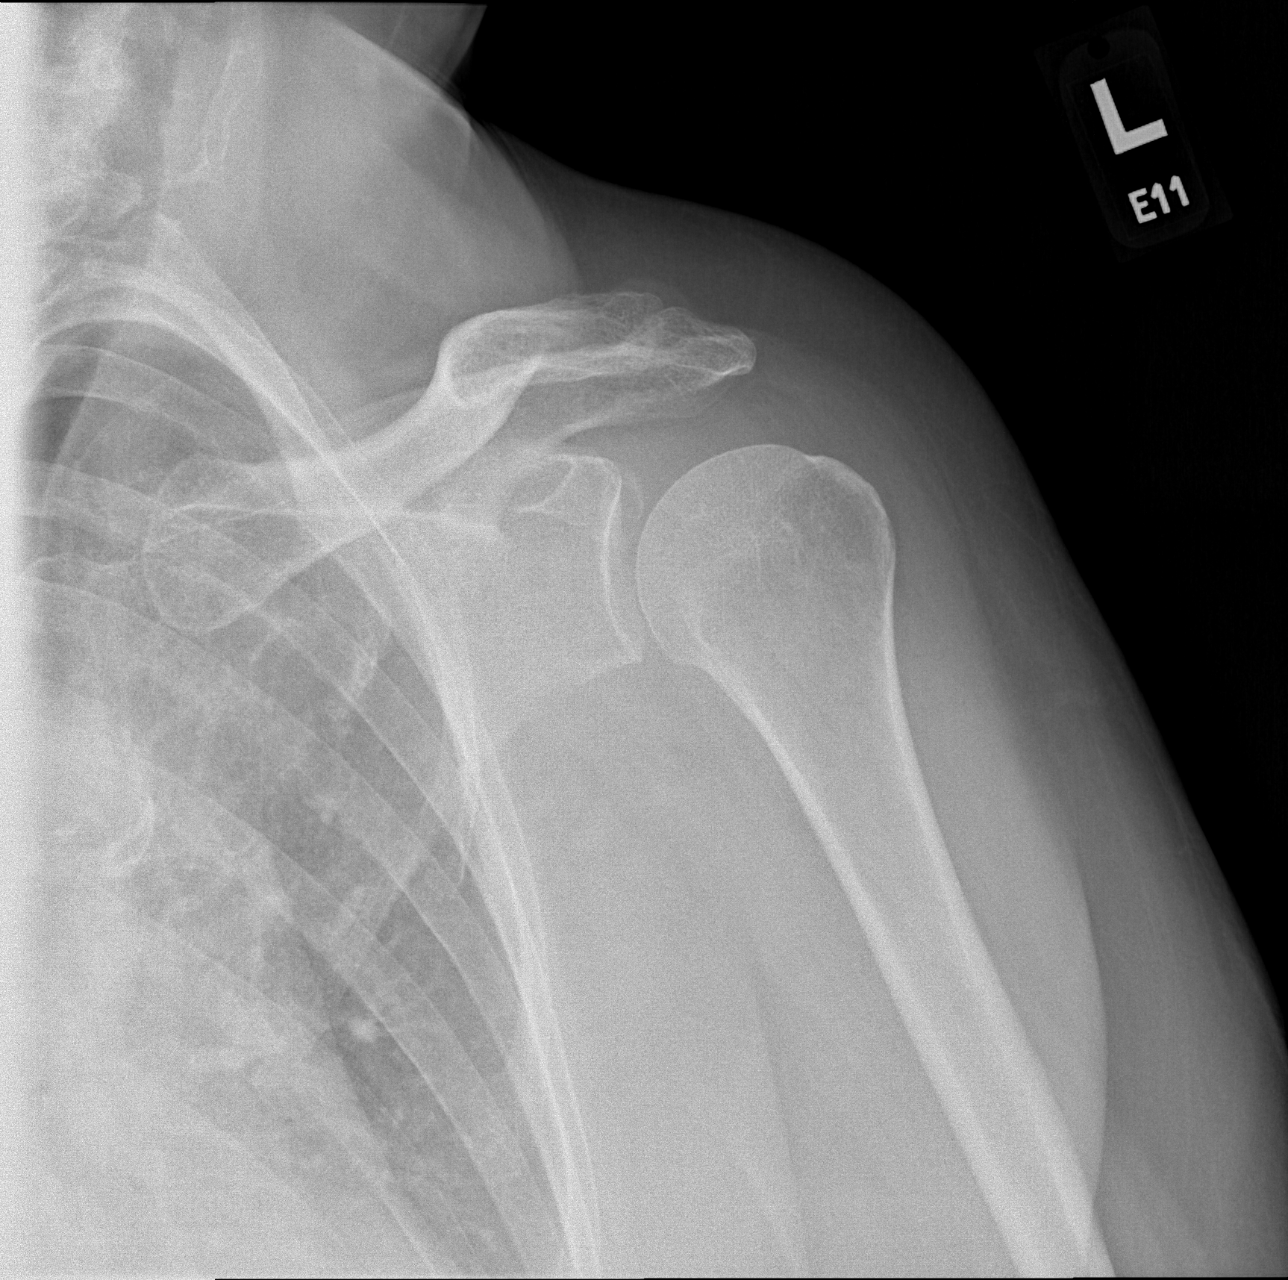

[w shoulder y-view left]
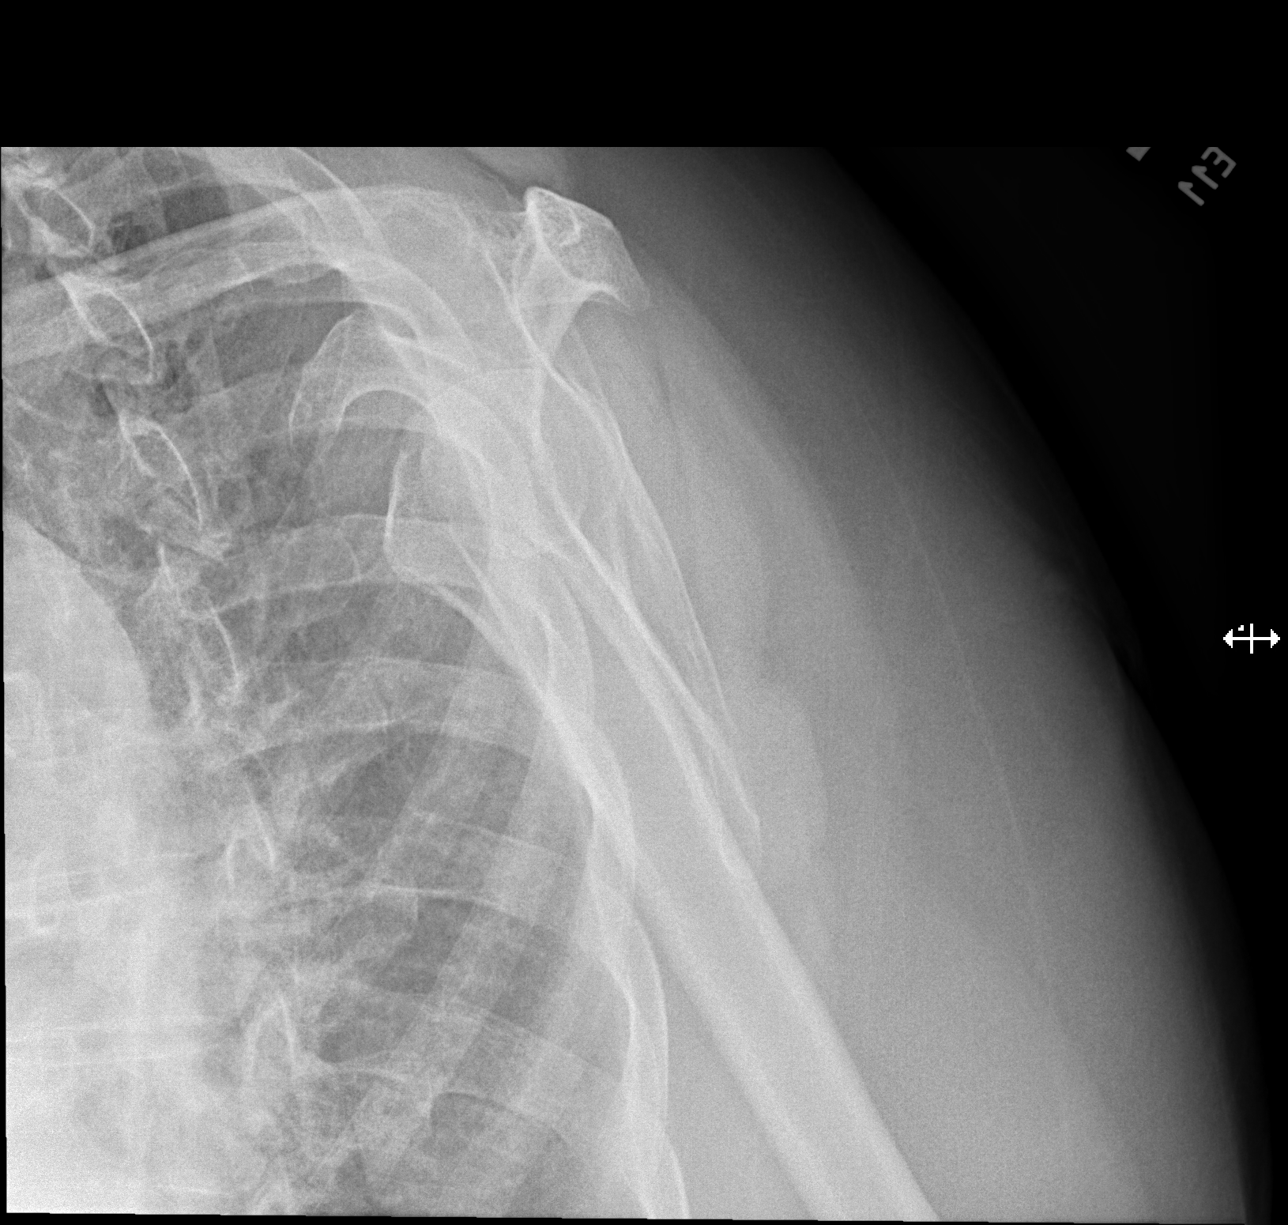

[x shoulder axillary left]
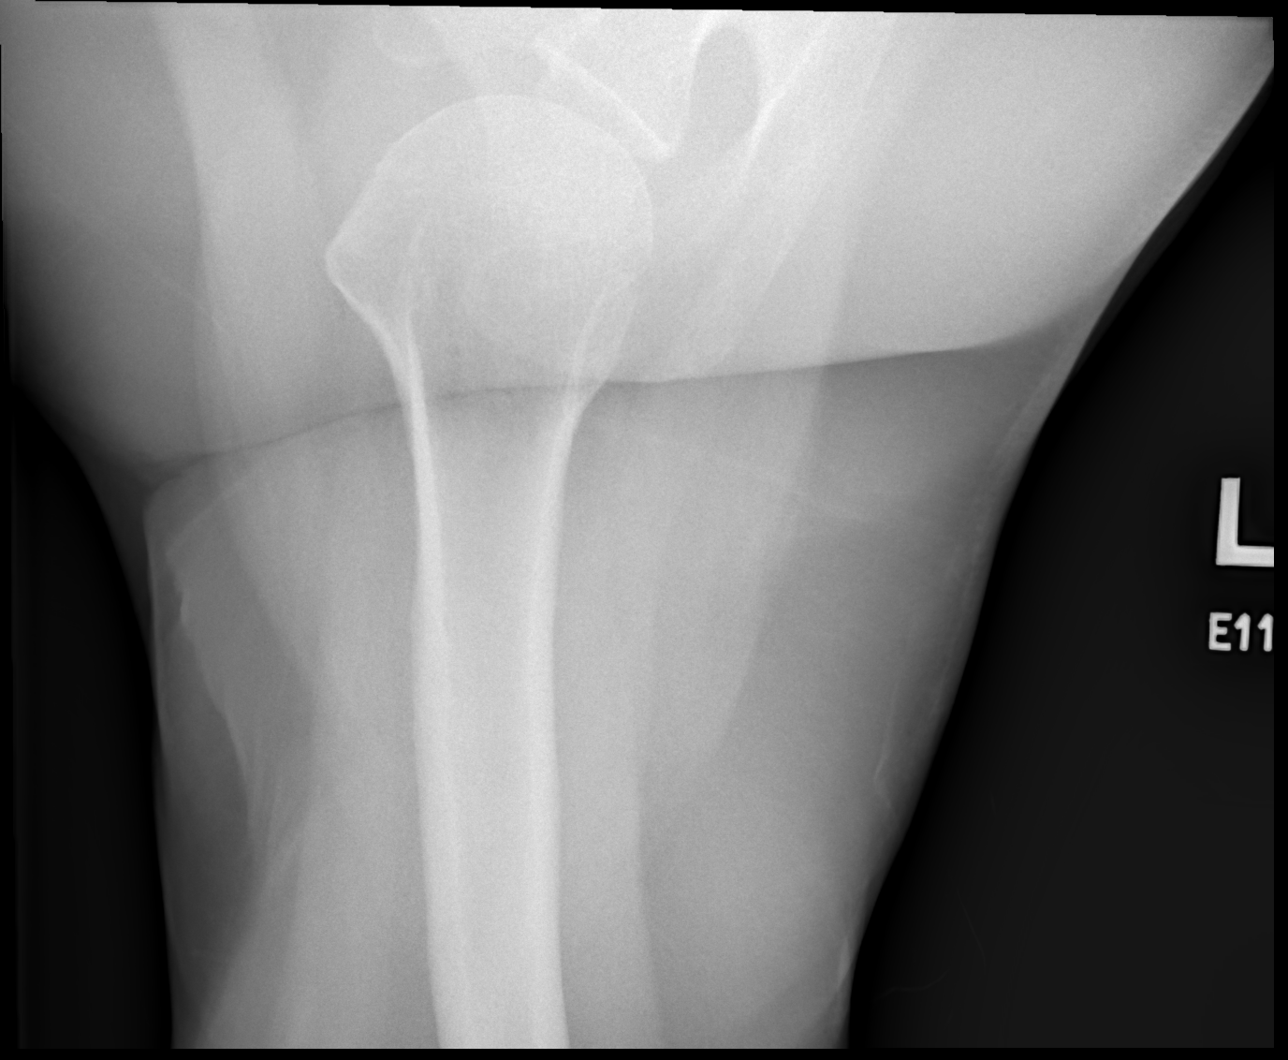

[3 of 3 positions shown; findings below may reference images not displayed]

FINDINGS: The glenohumeral and AC joints are intact.  No acute fracture.
IMPRESSION: No acute bony findings.

## 2023-08-04 NOTE — Telephone Encounter (Signed)
  Chief Complaint: Questions about dispensing medication Symptoms: none Frequency: now Pertinent Negatives: Patient denies  Disposition: [] ED /[] Urgent Care (no appt availability in office) / [] Appointment(In office/virtual)/ []  [facility]/ [] Home Care/ [] Refused Recommended Disposition /[] [facility]/ [x]  Follow-up with PCP Additional Notes: Pt needs help with proper dosing of Ozempic. Pt is unable to determine how to get the proper dose loaded. Pt will go to pharmacy for help with this.   Summary: med management   Pt stated she needs assistance with her Ozempic shot. She stated she is unsure of how many times she needs to turn the pen.   Seeking clinical advice.     Reason for Disposition  [1] Caller has URGENT medicine question about med that PCP or specialist prescribed AND [2] triager unable to answer question  Answer Assessment - Initial Assessment Questions 1. NAME of MEDICINE: "What medicine(s) are you calling about?"     Ozempic 2. QUESTION: "What is your question?" (e.g., double dose of medicine, side effect)     How to set dosage 3. PRESCRIBER: "Who prescribed the medicine?" Reason: if prescribed by specialist, call should be referred to that group.     PCP  Protocols used: Medication Question Call-A-AH

## 2023-08-05 DIAGNOSIS — Z7689 Persons encountering health services in other specified circumstances: Secondary | ICD-10-CM | POA: Diagnosis not present

## 2023-08-05 DIAGNOSIS — Z79899 Other long term (current) drug therapy: Secondary | ICD-10-CM | POA: Diagnosis not present

## 2023-08-05 DIAGNOSIS — R519 Headache, unspecified: Secondary | ICD-10-CM | POA: Diagnosis not present

## 2023-08-05 DIAGNOSIS — G43719 Chronic migraine without aura, intractable, without status migrainosus: Secondary | ICD-10-CM | POA: Diagnosis not present

## 2023-08-05 DIAGNOSIS — Z8673 Personal history of transient ischemic attack (TIA), and cerebral infarction without residual deficits: Secondary | ICD-10-CM | POA: Diagnosis not present

## 2023-08-10 DIAGNOSIS — Z419 Encounter for procedure for purposes other than remedying health state, unspecified: Secondary | ICD-10-CM | POA: Diagnosis not present

## 2023-08-12 DIAGNOSIS — F4310 Post-traumatic stress disorder, unspecified: Secondary | ICD-10-CM | POA: Diagnosis not present

## 2023-08-12 DIAGNOSIS — E785 Hyperlipidemia, unspecified: Secondary | ICD-10-CM | POA: Diagnosis not present

## 2023-08-12 DIAGNOSIS — J449 Chronic obstructive pulmonary disease, unspecified: Secondary | ICD-10-CM | POA: Diagnosis not present

## 2023-08-12 DIAGNOSIS — Z1211 Encounter for screening for malignant neoplasm of colon: Secondary | ICD-10-CM | POA: Diagnosis not present

## 2023-08-12 DIAGNOSIS — K5730 Diverticulosis of large intestine without perforation or abscess without bleeding: Secondary | ICD-10-CM | POA: Diagnosis not present

## 2023-08-12 DIAGNOSIS — I10 Essential (primary) hypertension: Secondary | ICD-10-CM | POA: Diagnosis not present

## 2023-08-12 DIAGNOSIS — G500 Trigeminal neuralgia: Secondary | ICD-10-CM | POA: Diagnosis not present

## 2023-08-12 DIAGNOSIS — F419 Anxiety disorder, unspecified: Secondary | ICD-10-CM | POA: Diagnosis not present

## 2023-08-12 DIAGNOSIS — R569 Unspecified convulsions: Secondary | ICD-10-CM | POA: Diagnosis not present

## 2023-08-12 DIAGNOSIS — K219 Gastro-esophageal reflux disease without esophagitis: Secondary | ICD-10-CM | POA: Diagnosis not present

## 2023-08-12 DIAGNOSIS — E119 Type 2 diabetes mellitus without complications: Secondary | ICD-10-CM | POA: Diagnosis not present

## 2023-08-12 DIAGNOSIS — F32A Depression, unspecified: Secondary | ICD-10-CM | POA: Diagnosis not present

## 2023-08-12 DIAGNOSIS — G4733 Obstructive sleep apnea (adult) (pediatric): Secondary | ICD-10-CM | POA: Diagnosis not present

## 2023-08-14 IMAGING — CR DG RIBS W/ CHEST 3+V*L*
5 series · 5 of 5 positions shown · non-contrast
Comparison: Chest radiograph on 06/21/2021

CLINICAL DATA: Fall this morning. Left chest pain. Initial
encounter.

EXAM:
LEFT RIBS AND CHEST - 3+ VIEW

[chest ap]
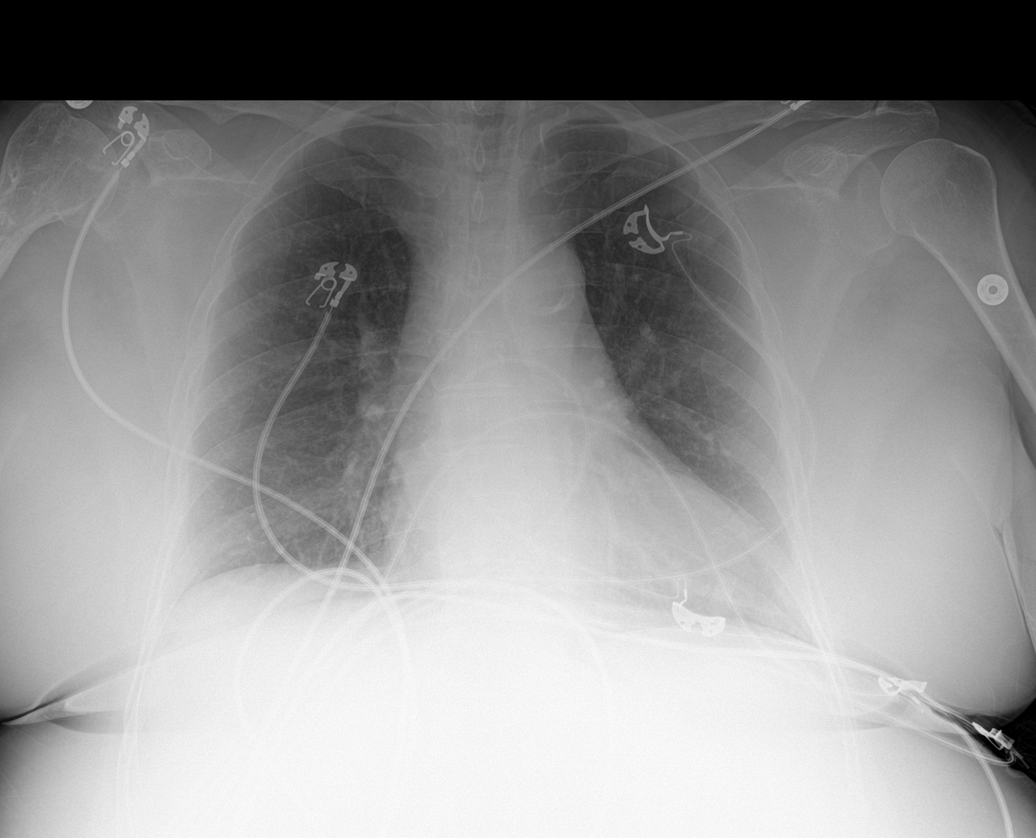

[t ribs ap upper left]
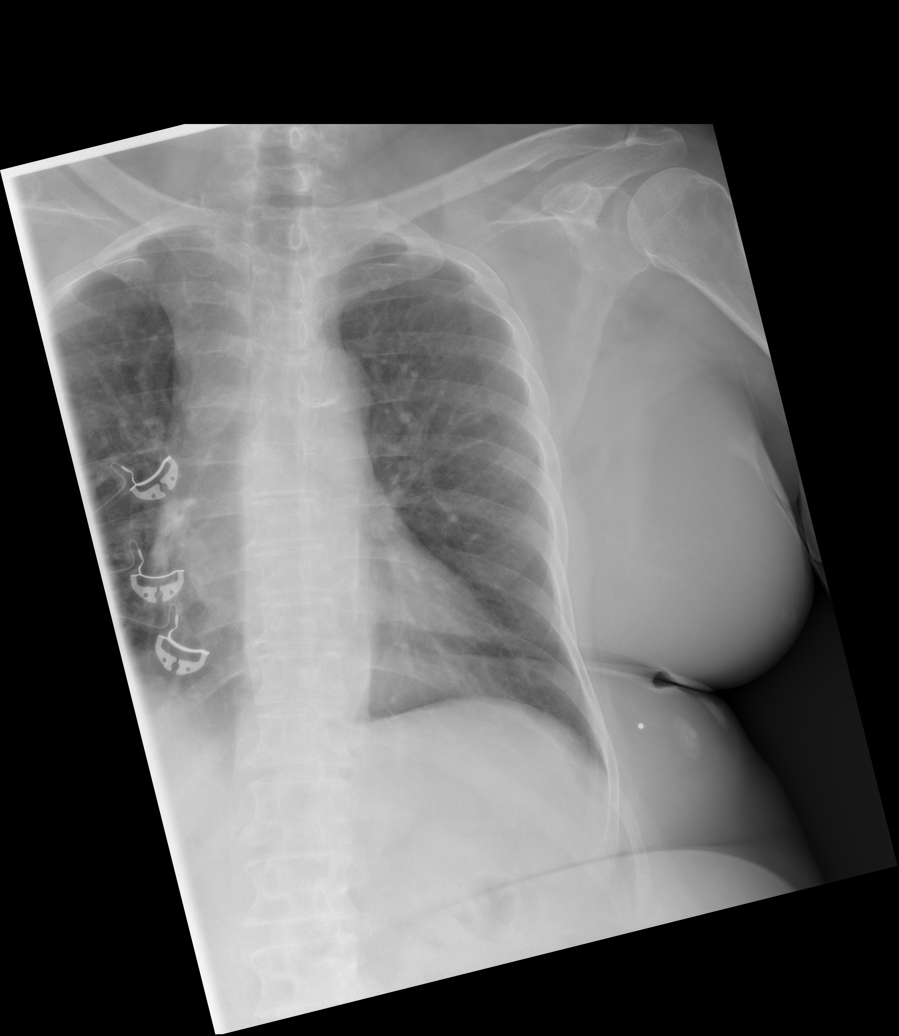

[t ribs ap lower left]
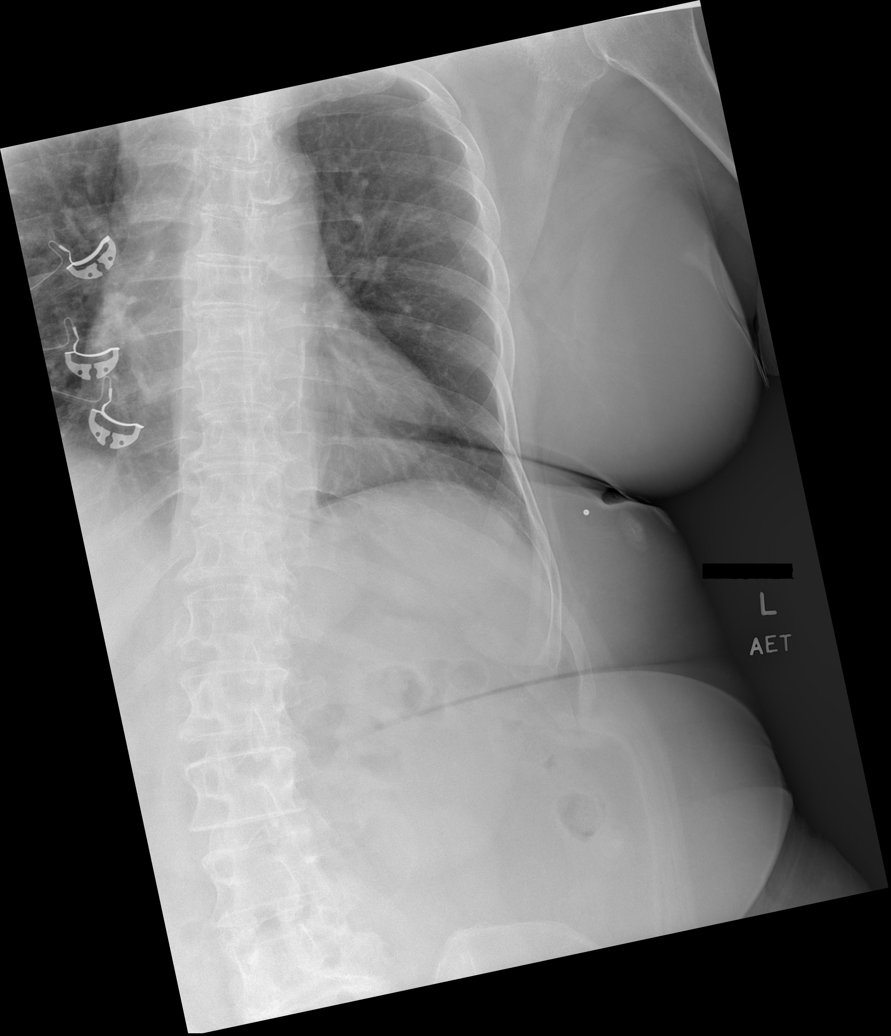

[t ribs lpo left (1 of 2)]
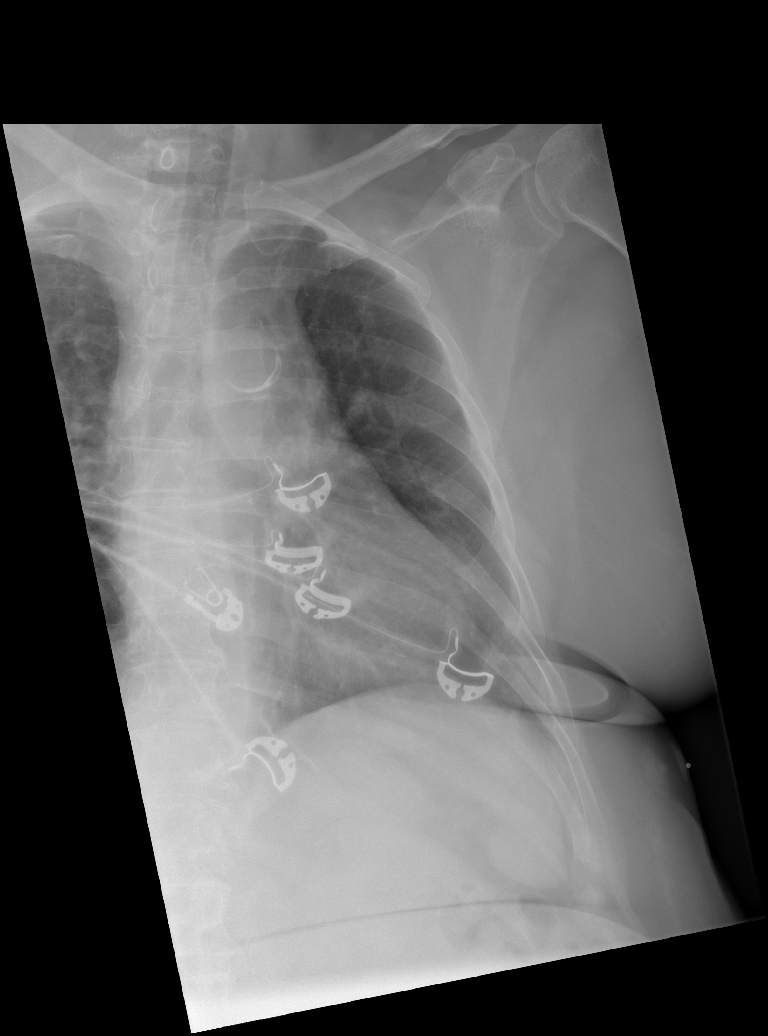

[t ribs lpo left (2 of 2)]
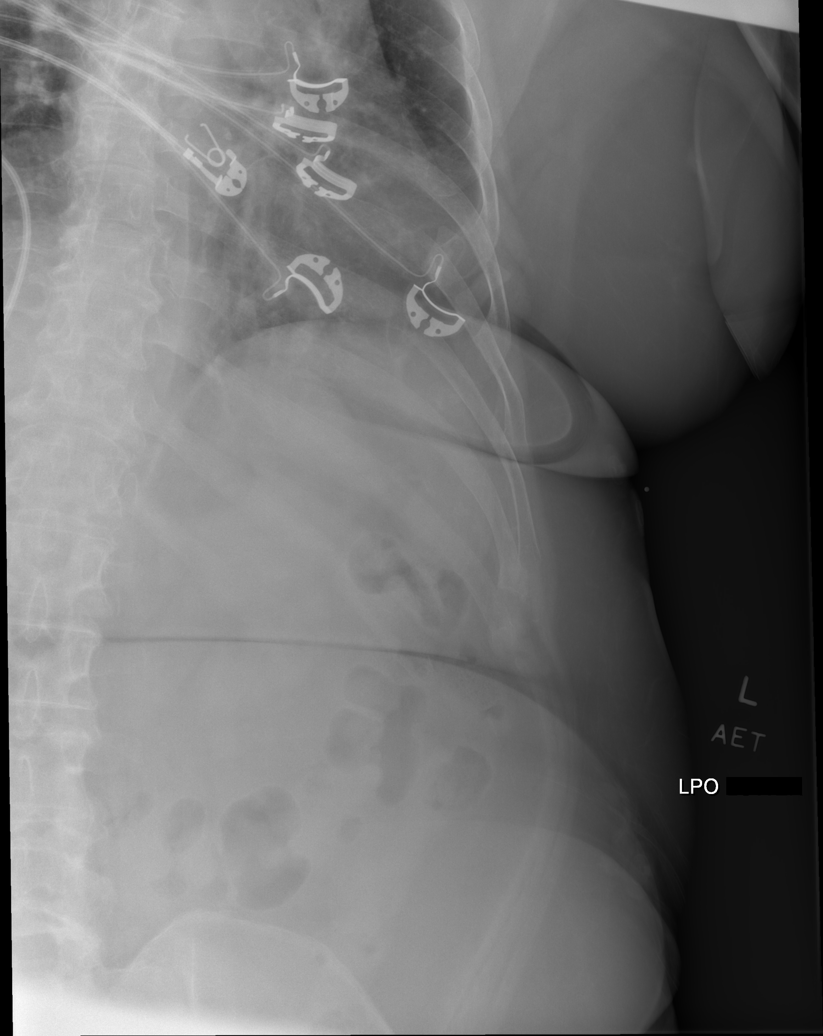

[5 of 5 positions shown; findings below may reference images not displayed]

FINDINGS: No fracture or other bone lesions are seen involving the ribs. There
is no evidence of pneumothorax or pleural effusion. Both lungs are
clear.

Heart size and mediastinal contours are within normal limits. Aortic
atherosclerotic calcification noted.
IMPRESSION: Negative.

## 2023-08-14 IMAGING — DX DG CHEST 1V PORT
1 series · 1 of 1 positions shown · non-contrast
Comparison: Chest radiograph 06/21/2021

CLINICAL DATA: Shortness of breath.  Fall earlier today.

EXAM:
PORTABLE CHEST 1 VIEW

[chest ap]
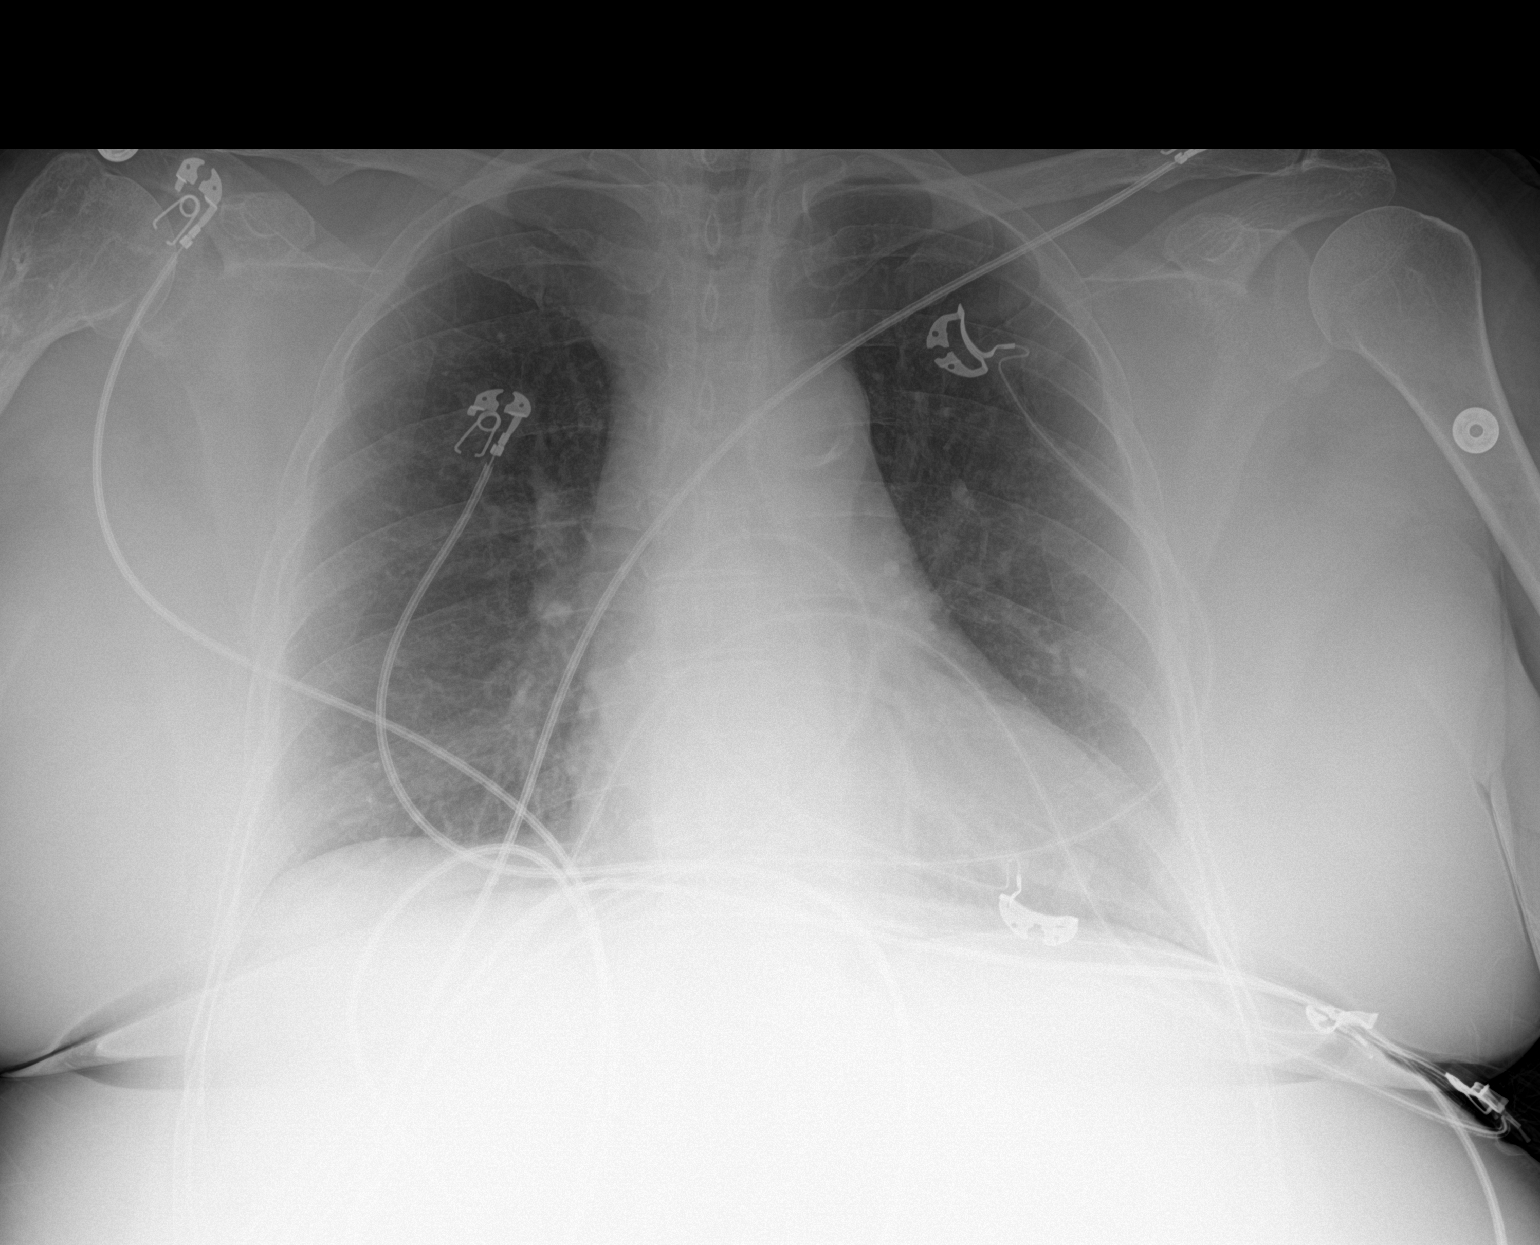

[1 of 1 positions shown; findings below may reference images not displayed]

FINDINGS: The heart size and mediastinal contours are within normal limits.
Aortic calcifications. Subsegmental left lower lobe atelectasis. The
right lung is clear. No pleural effusion or pneumothorax. No acute
osseous abnormality.
IMPRESSION: No acute cardiopulmonary abnormality.

Aortic Atherosclerosis (OAK98-RIN.N).

## 2023-08-14 IMAGING — CT CT CERVICAL SPINE W/O CM
3 of 4 series · 12 of 33 positions shown, 14 images · non-contrast
Comparison: CT head cervical spine 06/21/2021

CLINICAL DATA: Head trauma.  Fall

EXAM:
CT HEAD WITHOUT CONTRAST
CT CERVICAL SPINE WITHOUT CONTRAST
TECHNIQUE: Multidetector CT imaging of the head and cervical spine was
performed following the standard protocol without intravenous
contrast. Multiplanar CT image reconstructions of the cervical spine
were also generated.

[Series 6: orthogonal bone · axial · 0.23mm/px · z∈[-347,-216]mm · 4 of 101 slices shown, 5 images]
[im 17/101  soft-tissue]
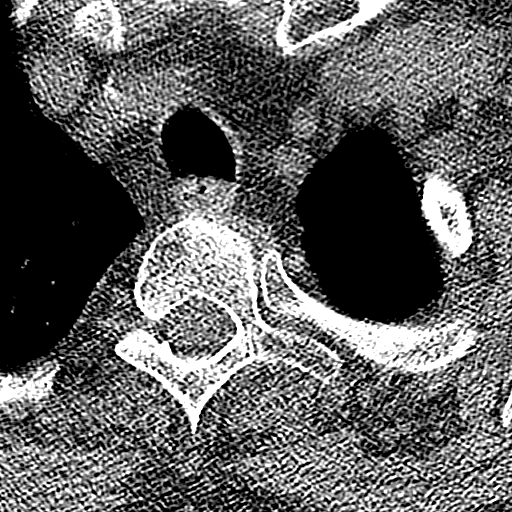
[im 17/101  bone]
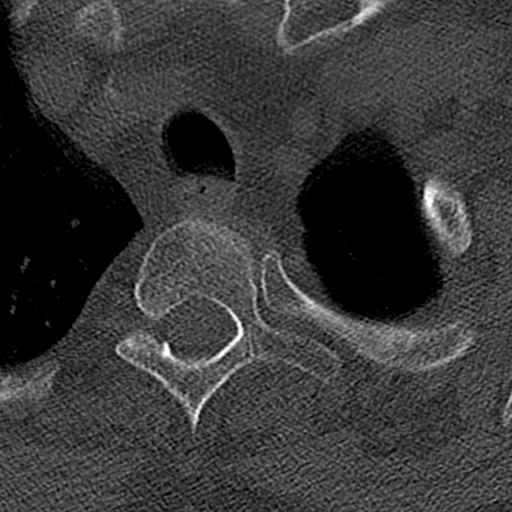
[im 34/101  bone]
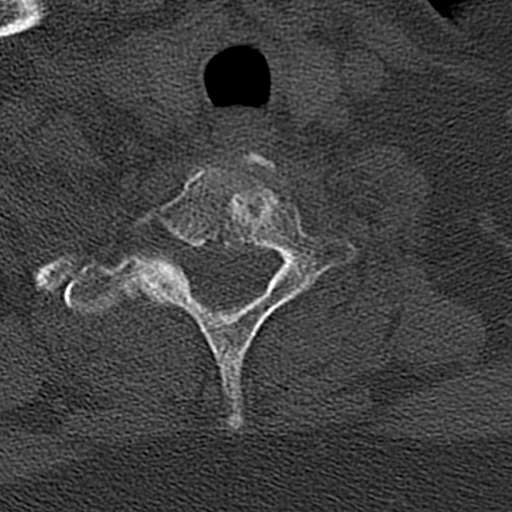
[im 67/101  bone]
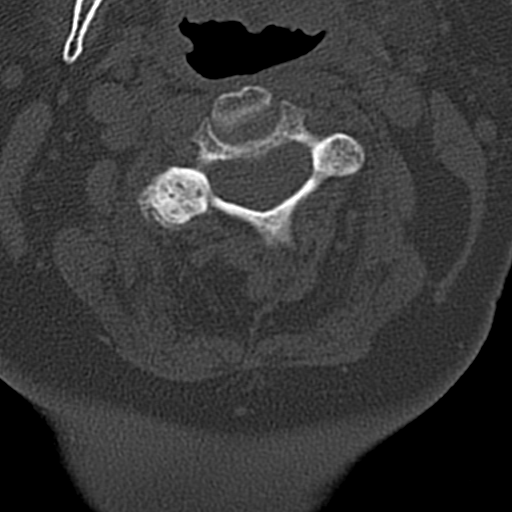
[im 84/101  bone]
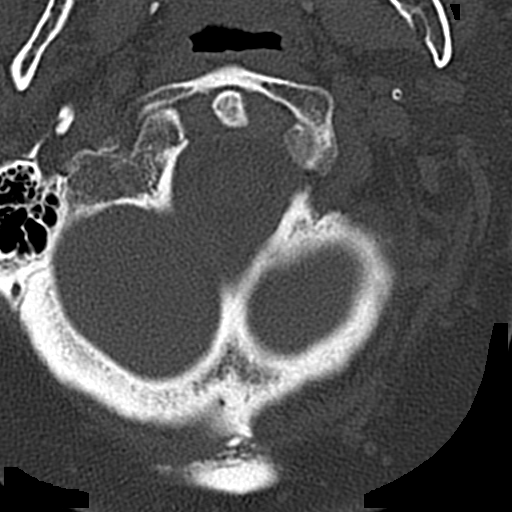

[Series 7: coronal bone · coronal · 0.27mm/px · 3 of 61 slices shown]
[im 13/61  bone]
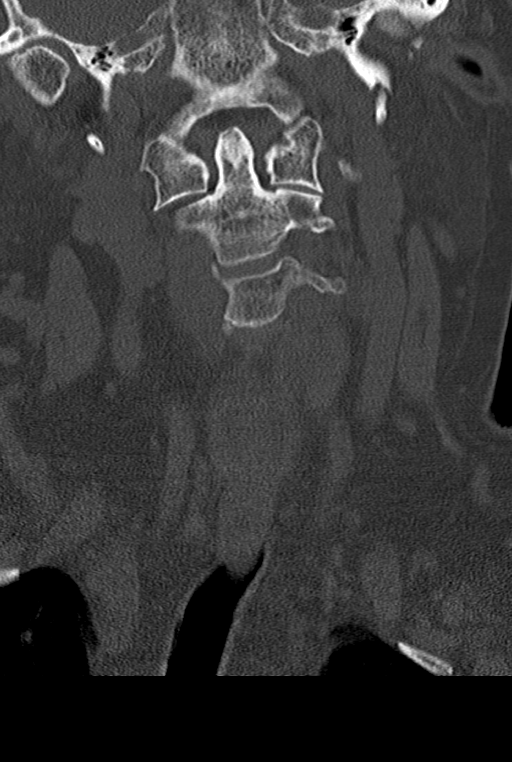
[im 25/61  bone]
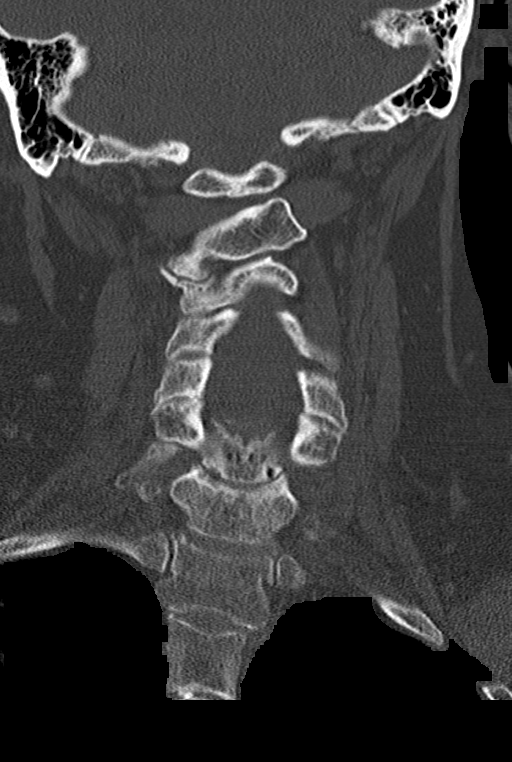
[im 37/61  bone]
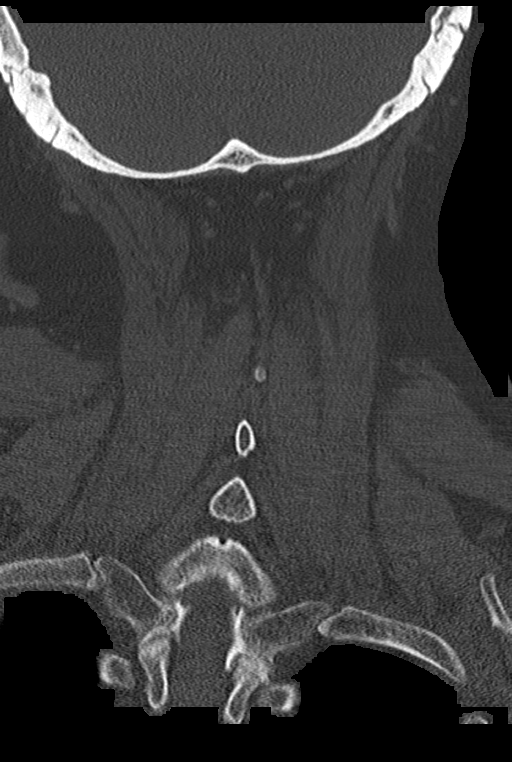

[Series 8: sagittal bone · sagittal · 0.27mm/px · 5 of 62 slices shown, 6 images]
[im 21/62  bone]
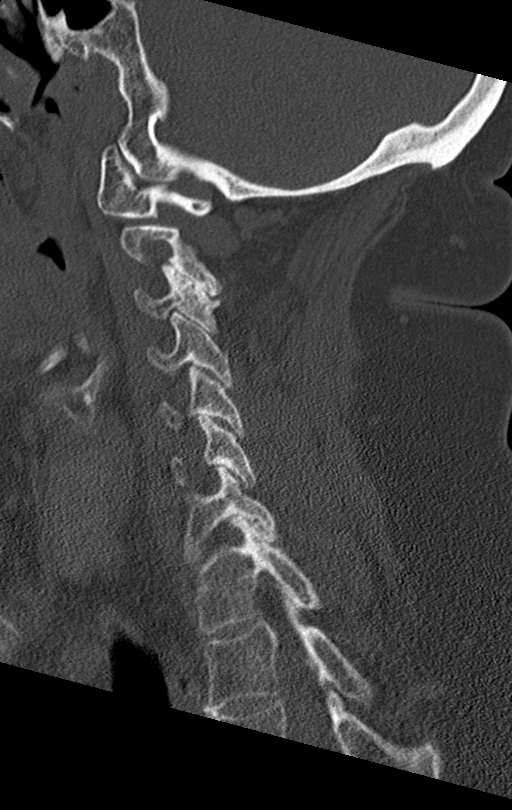
[im 26/62  bone]
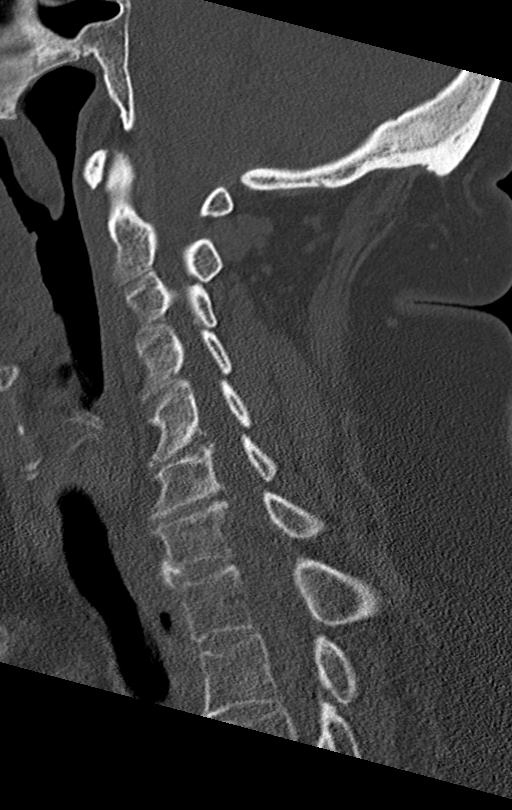
[im 31/62  soft-tissue]
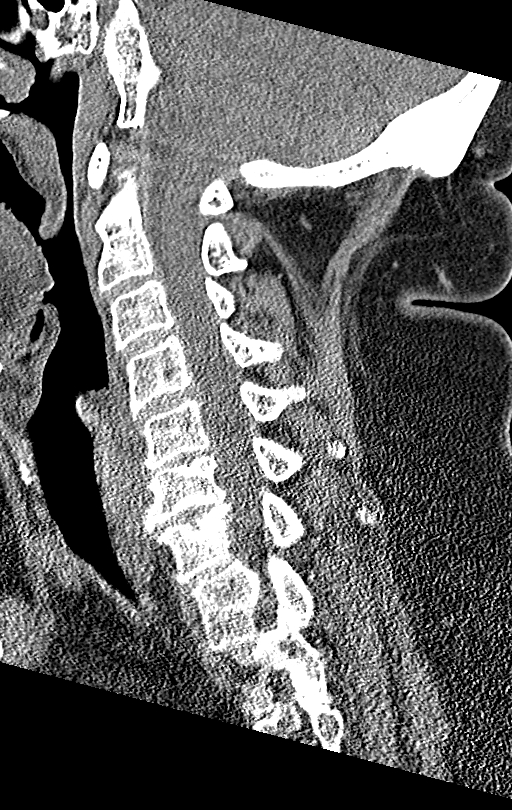
[im 31/62  bone]
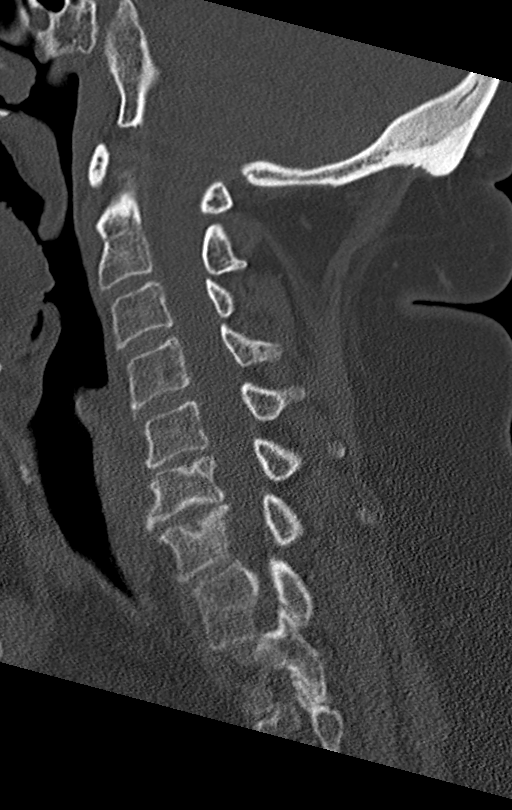
[im 36/62  bone]
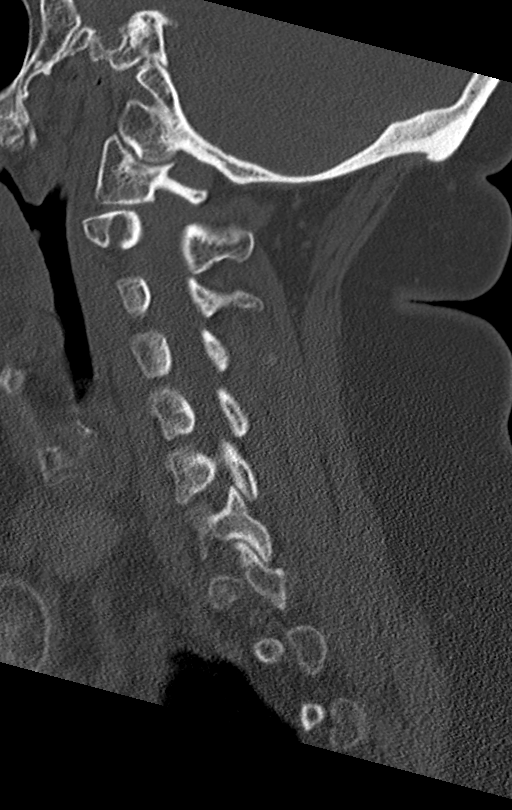
[im 41/62  bone]
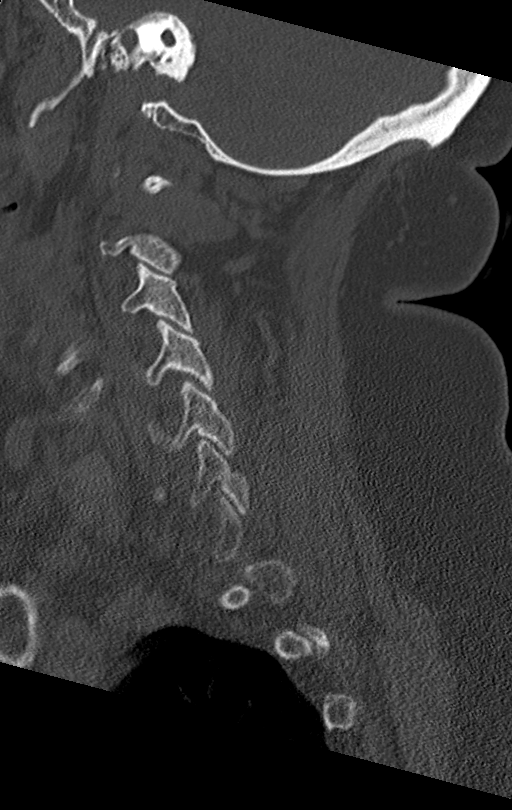

[12 of 33 positions shown; findings below may reference images not displayed]

FINDINGS: CT HEAD FINDINGS

Brain: Chronic infarct left temporoparietal lobe unchanged. Negative
for acute infarct, hemorrhage, mass, hydrocephalus. Empty sella.

Vascular: Negative for hyperdense vessel

Skull: Negative

Sinuses/Orbits: Mucosal edema paranasal sinuses. Prior sinus
surgery. Negative orbit

Other: None

CT CERVICAL SPINE FINDINGS

Alignment: Normal

Skull base and vertebrae: Negative for fracture

Soft tissues and spinal canal: Atherosclerotic calcification of the
carotid artery bilaterally. No soft tissue mass.

Disc levels: Disc and facet degeneration in the cervical spine
without significant spinal stenosis.

Upper chest: Lung apices clear bilaterally.

Other: None
IMPRESSION: 1. No acute intracranial abnormality. Chronic infarct left
temporoparietal lobe
2. Negative for cervical spine fracture.

## 2023-08-14 IMAGING — CR DG SHOULDER 2+V*L*
3 series · 3 of 3 positions shown · non-contrast
Comparison: 07/04/2021

CLINICAL DATA: 55-year-old female with a history of left anterior
shoulder pain after a fall

EXAM:
LEFT SHOULDER - 2+ VIEW

[x shoulder ap left (1 of 3)]
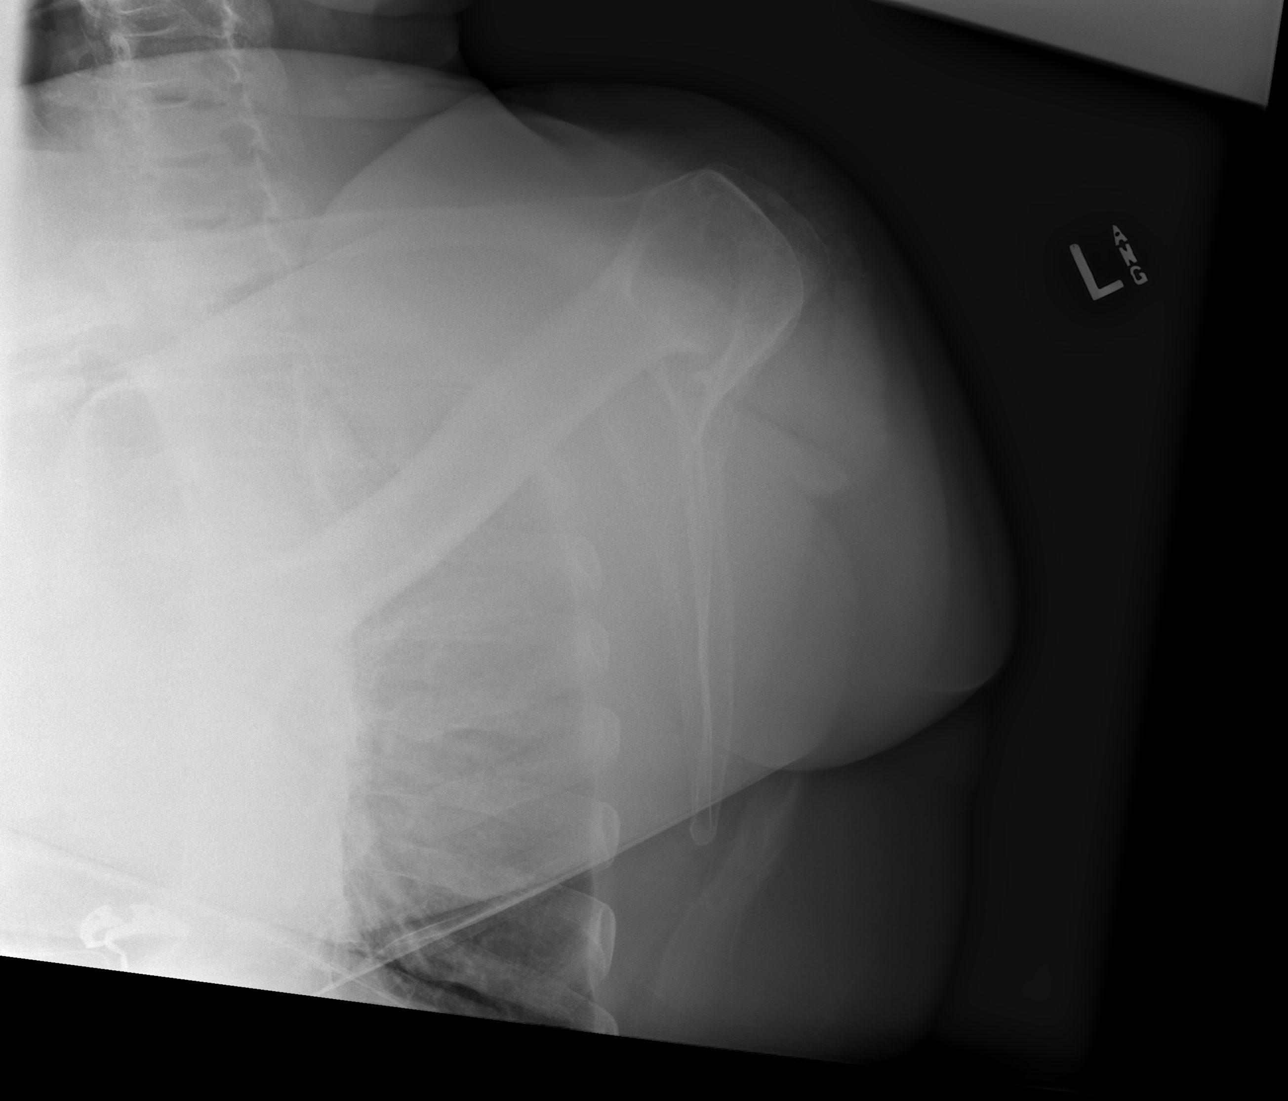

[x shoulder ap left (2 of 3)]
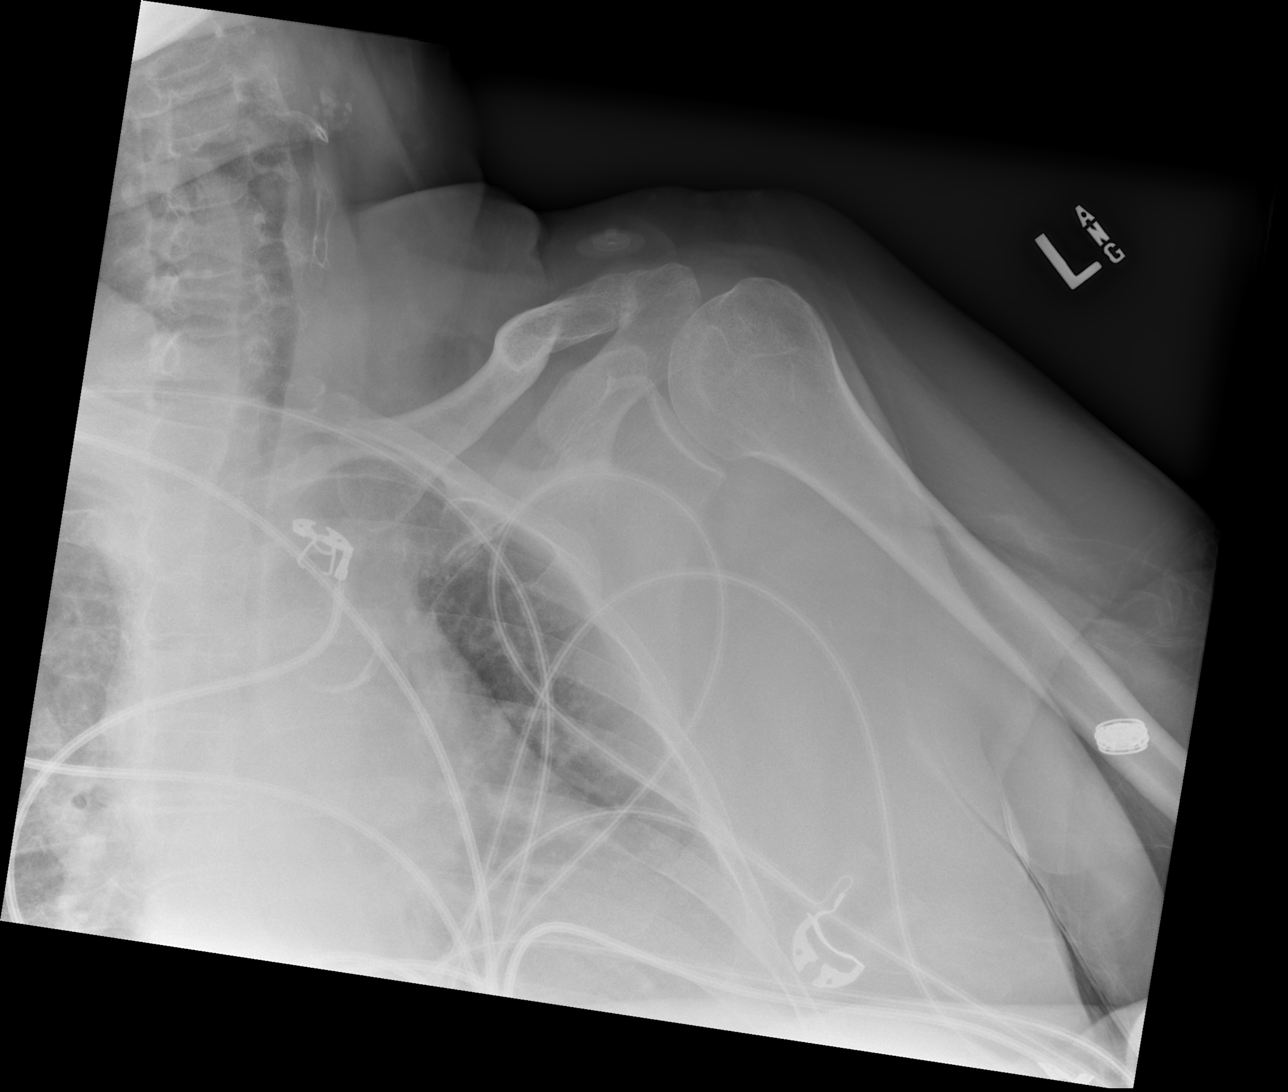

[x shoulder ap left (3 of 3)]
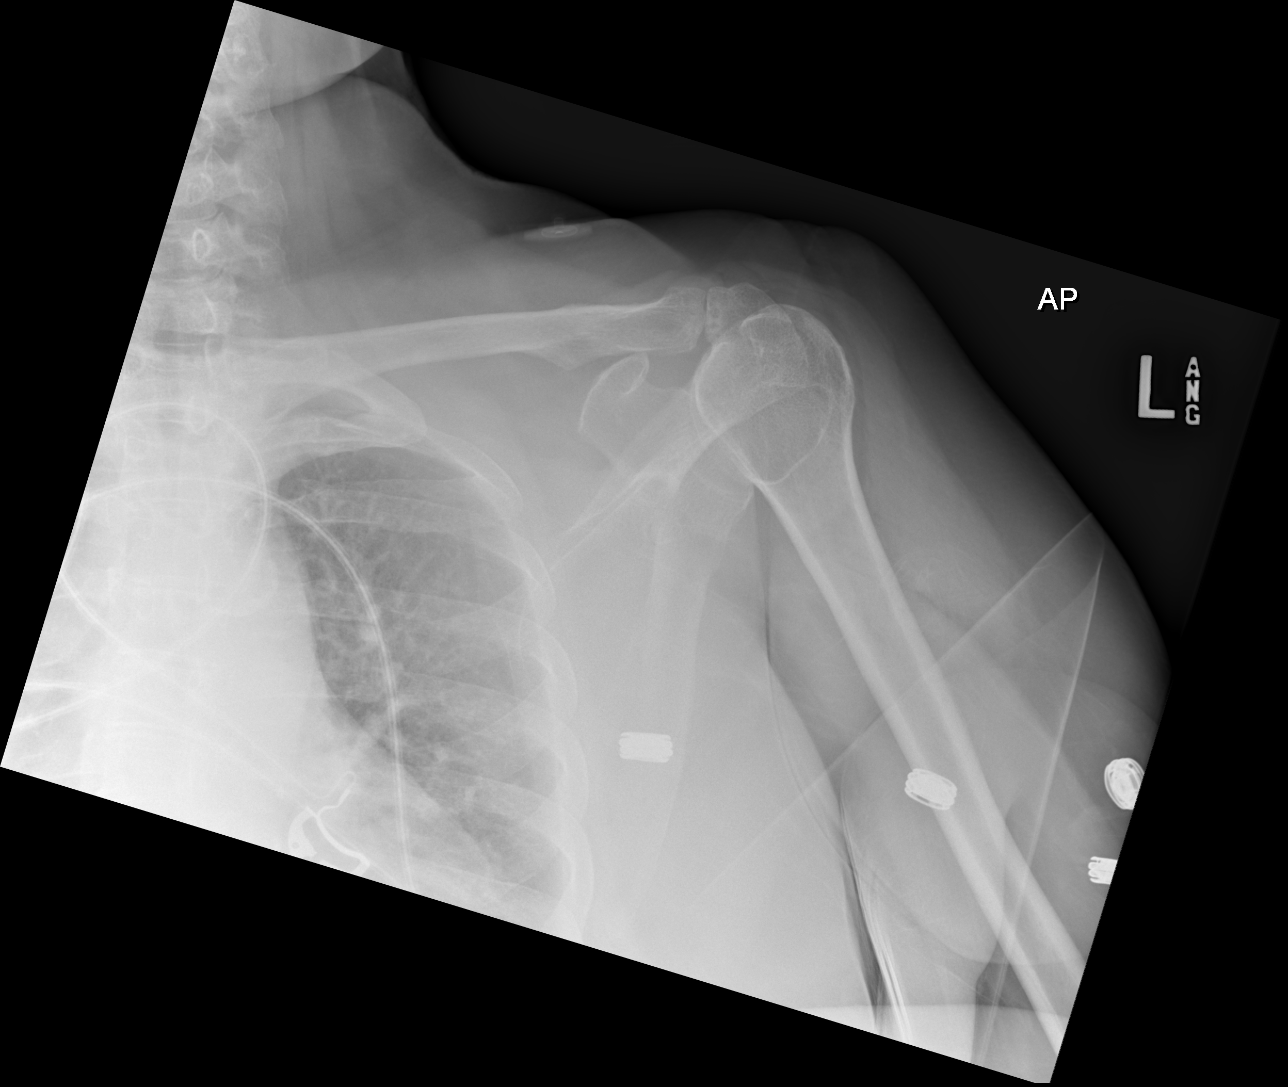

[3 of 3 positions shown; findings below may reference images not displayed]

FINDINGS: No acute displaced fracture. Glenohumeral joint appears congruent.
Degenerative changes of the acromioclavicular joint. Unremarkable
appearance of the proximal humerus.
IMPRESSION: Negative for acute bony abnormality.

Degenerative changes of the AC joint

## 2023-08-14 IMAGING — CT CT HEAD W/O CM
4 series · 15 of 47 positions shown, 17 images · non-contrast
Comparison: CT head cervical spine 06/21/2021

CLINICAL DATA: Head trauma.  Fall

EXAM:
CT HEAD WITHOUT CONTRAST
CT CERVICAL SPINE WITHOUT CONTRAST
TECHNIQUE: Multidetector CT imaging of the head and cervical spine was
performed following the standard protocol without intravenous
contrast. Multiplanar CT image reconstructions of the cervical spine
were also generated.

[Series 3: head wo · axial · 0.47mm/px · z∈[-200,-70]mm · 7 of 36 slices shown, 9 images]
[im 5/36  brain]
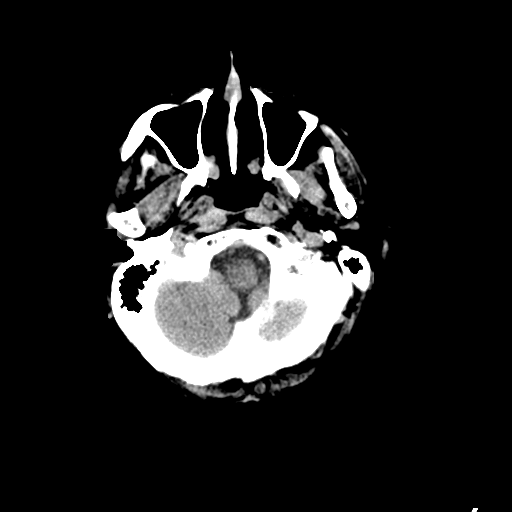
[im 5/36  bone]
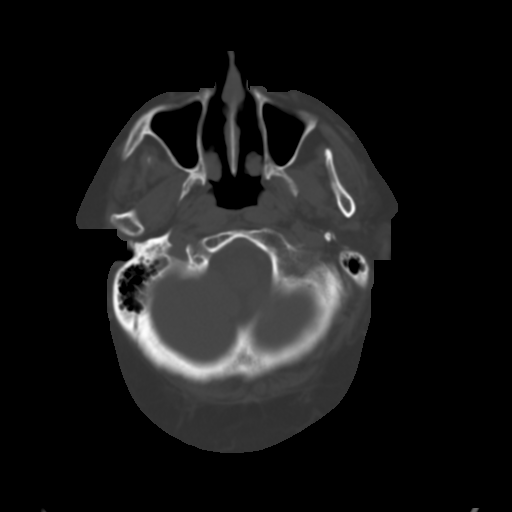
[im 9/36  brain]
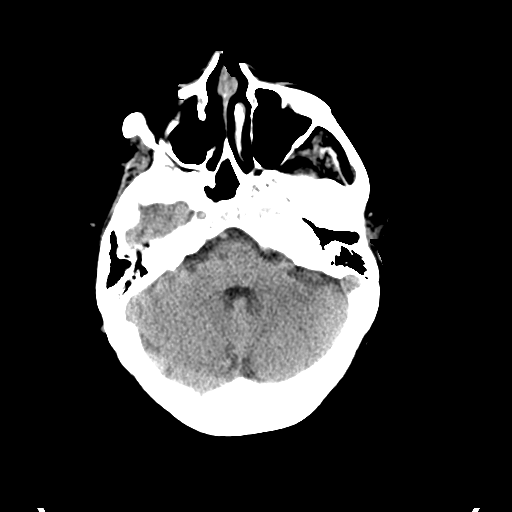
[im 14/36  brain]
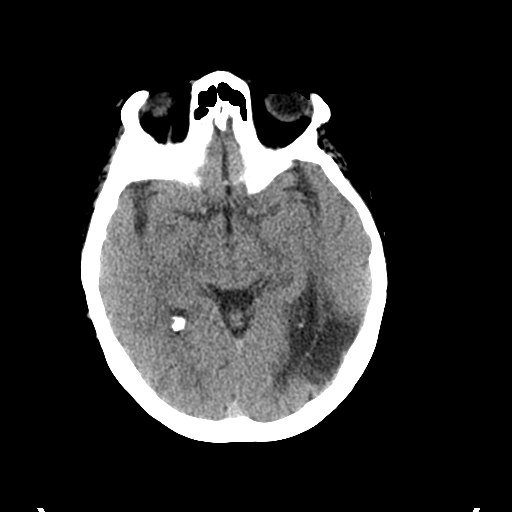
[im 18/36  brain]
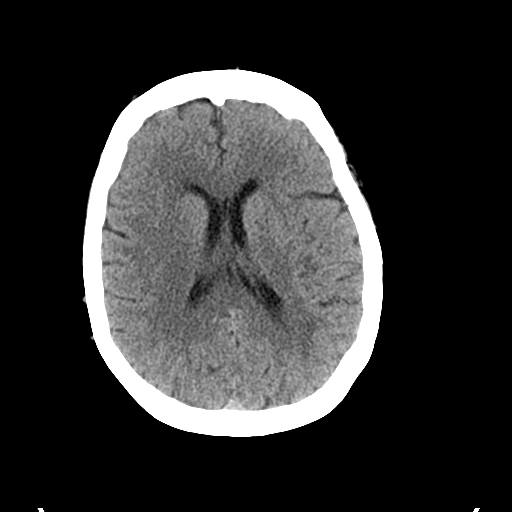
[im 22/36  brain]
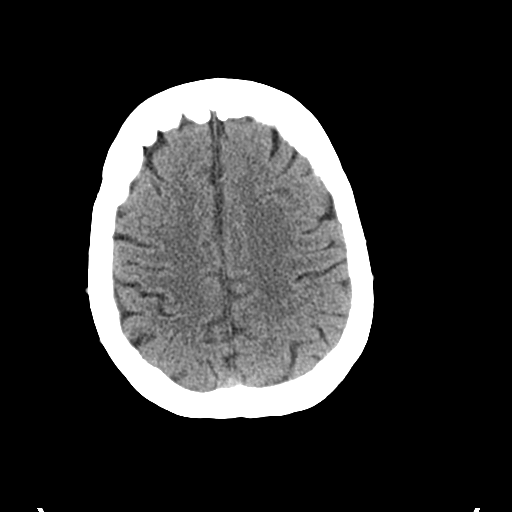
[im 22/36  bone]
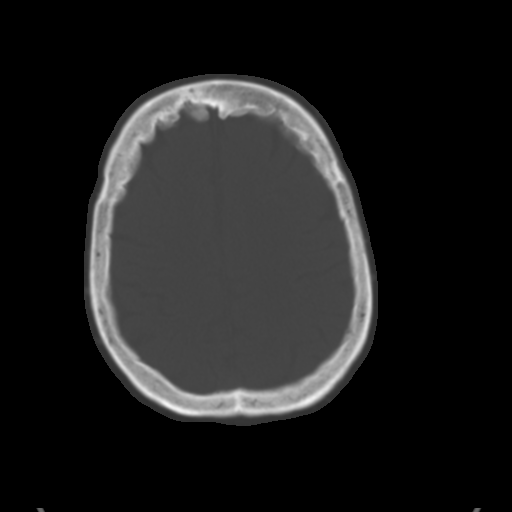
[im 27/36  brain]
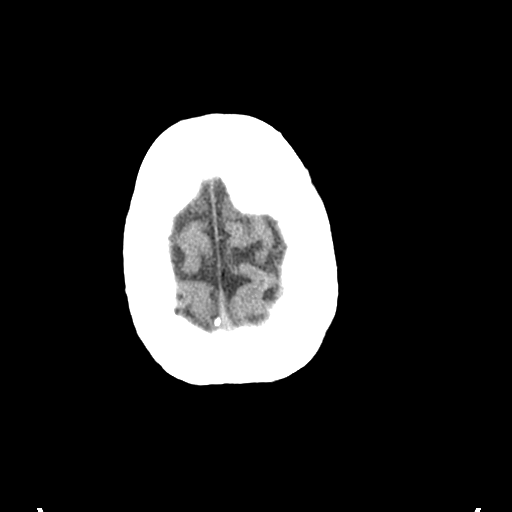
[im 31/36  brain]
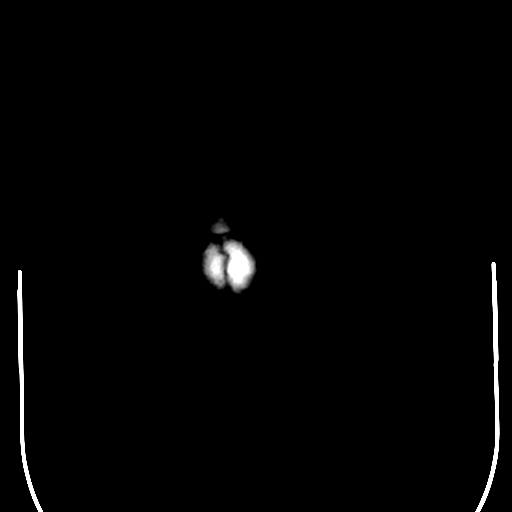

[Series 4: head bone · axial · 0.47mm/px · z∈[-204,-186]mm · 2 of 88 slices shown]
[im 9/88  bone]
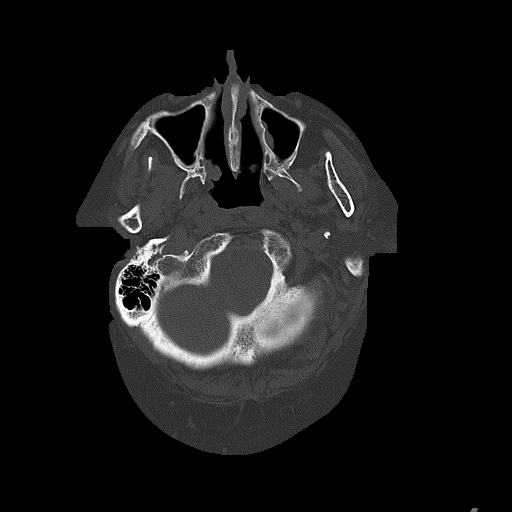
[im 18/88  bone]
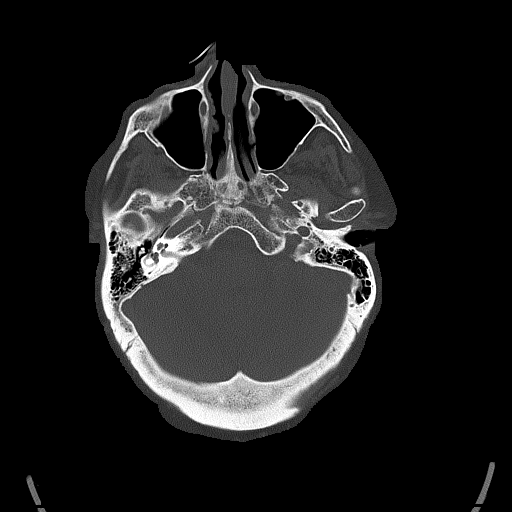

[Series 6: coronal soft tissue · coronal · 0.34mm/px · 3 of 70 slices shown]
[im 24/70  brain]
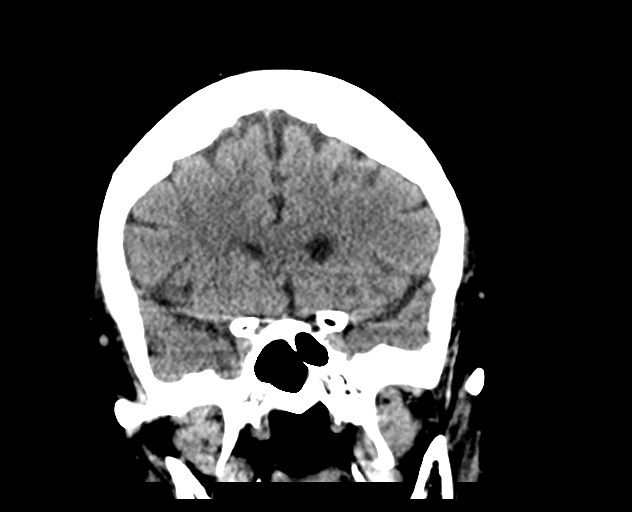
[im 31/70  brain]
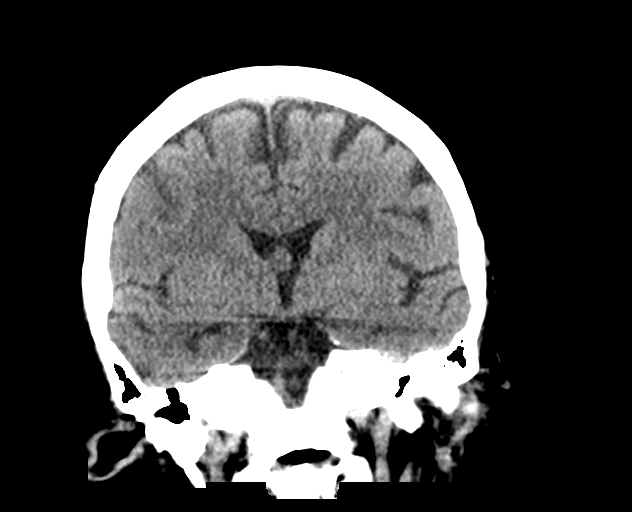
[im 39/70  brain]
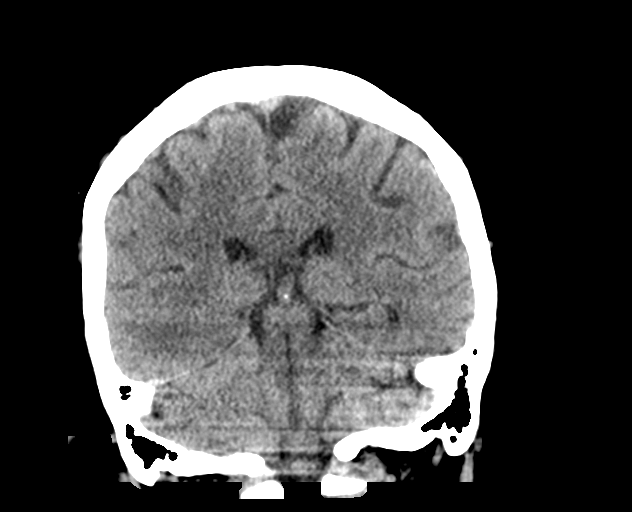

[Series 7: sagittal soft tissue · sagittal · 0.34mm/px · 3 of 57 slices shown]
[im 19/57  brain]
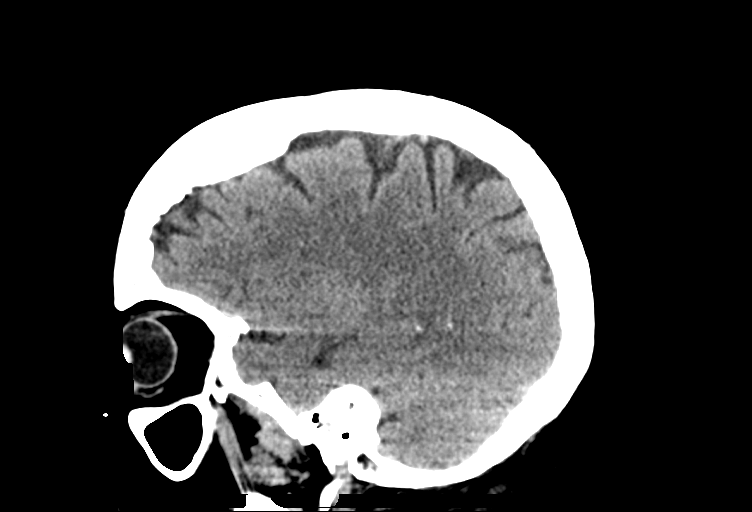
[im 29/57  brain]
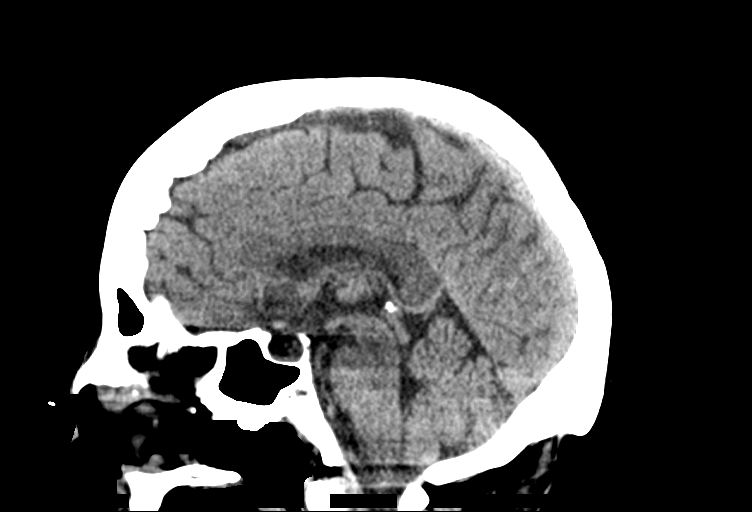
[im 38/57  brain]
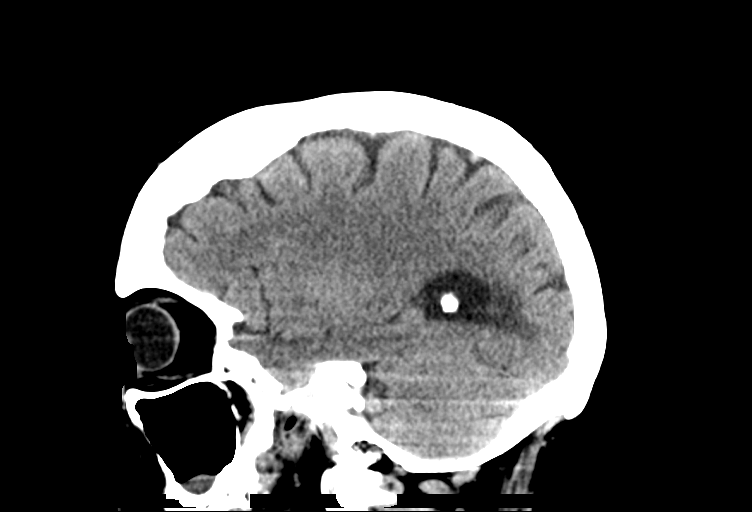

[15 of 47 positions shown; findings below may reference images not displayed]

FINDINGS: CT HEAD FINDINGS

Brain: Chronic infarct left temporoparietal lobe unchanged. Negative
for acute infarct, hemorrhage, mass, hydrocephalus. Empty sella.

Vascular: Negative for hyperdense vessel

Skull: Negative

Sinuses/Orbits: Mucosal edema paranasal sinuses. Prior sinus
surgery. Negative orbit

Other: None

CT CERVICAL SPINE FINDINGS

Alignment: Normal

Skull base and vertebrae: Negative for fracture

Soft tissues and spinal canal: Atherosclerotic calcification of the
carotid artery bilaterally. No soft tissue mass.

Disc levels: Disc and facet degeneration in the cervical spine
without significant spinal stenosis.

Upper chest: Lung apices clear bilaterally.

Other: None
IMPRESSION: 1. No acute intracranial abnormality. Chronic infarct left
temporoparietal lobe
2. Negative for cervical spine fracture.

## 2023-08-16 IMAGING — CT CT MAXILLOFACIAL W/O CM
3 series · 15 of 47 positions shown, 18 images · non-contrast
Comparison: Head and cervical spine CT dated 07/14/2021.
Maxillofacial CT dated 05/11/2020.

CLINICAL DATA: Nasal and left facial swelling following a fall onto
her face this morning. Concussion diagnosed after a fall last week.

EXAM:
CT HEAD WITHOUT CONTRAST
CT MAXILLOFACIAL WITHOUT CONTRAST
CT CERVICAL SPINE WITHOUT CONTRAST
TECHNIQUE: Multidetector CT imaging of the head, cervical spine, and
maxillofacial structures were performed using the standard protocol
without intravenous contrast. Multiplanar CT image reconstructions
of the cervical spine and maxillofacial structures were also
generated.

[Series 2: max soft · axial · 0.33mm/px · z∈[-253,-103]mm · 9 of 88 slices shown, 12 images]
[im 7/88  brain]
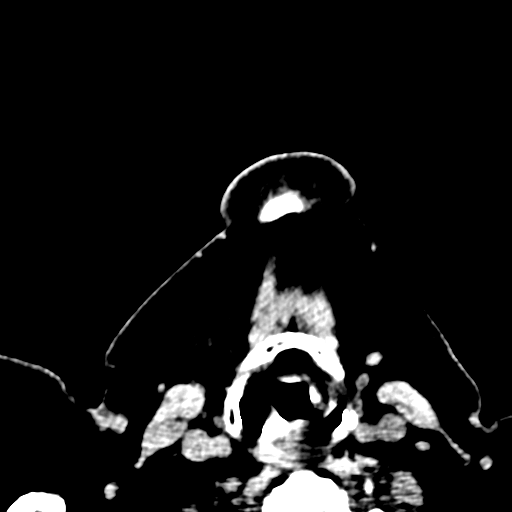
[im 7/88  bone]
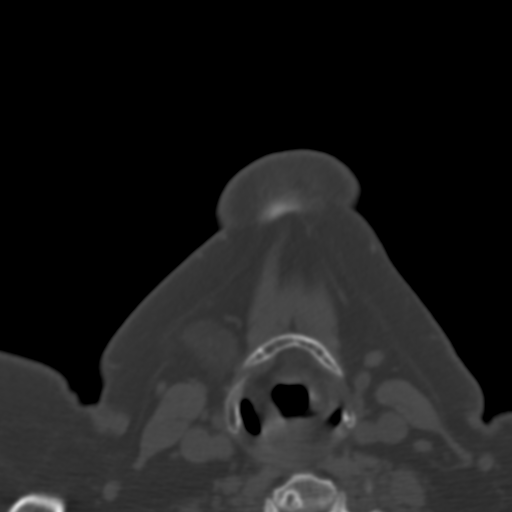
[im 16/88  bone]
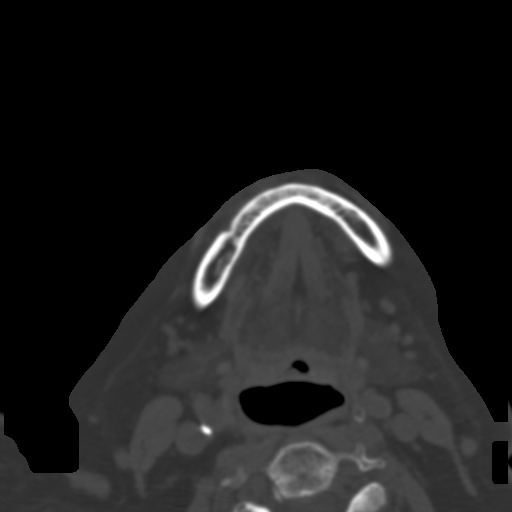
[im 25/88  bone]
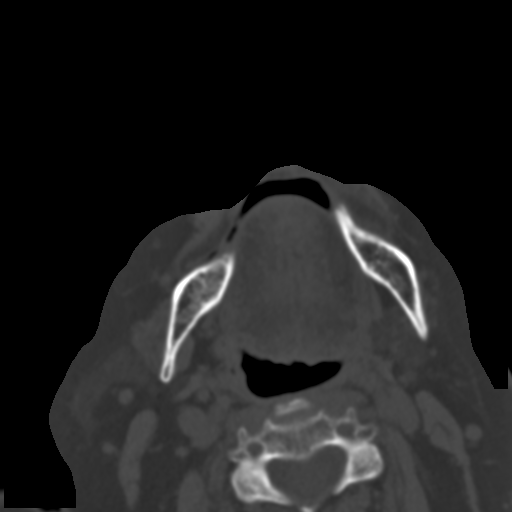
[im 34/88  bone]
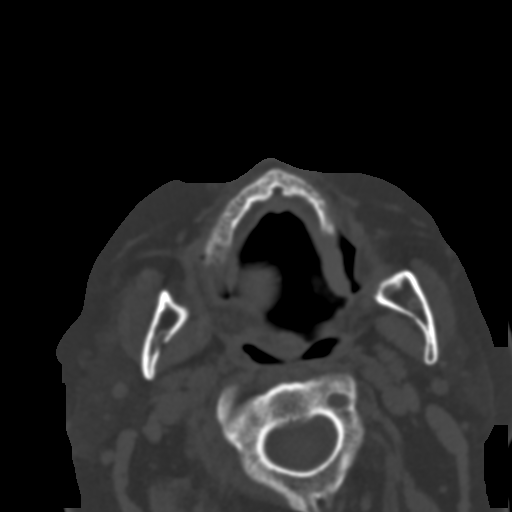
[im 46/88  brain]
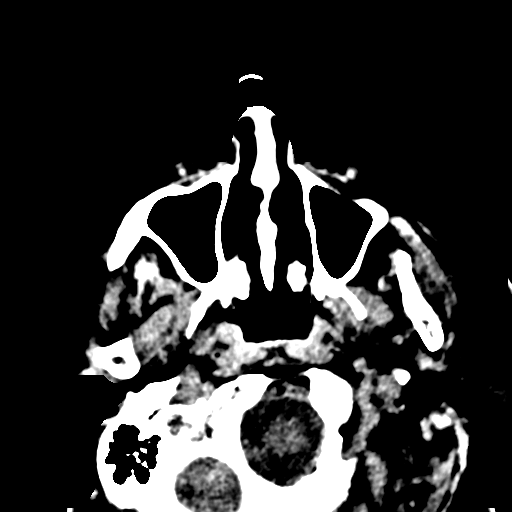
[im 46/88  bone]
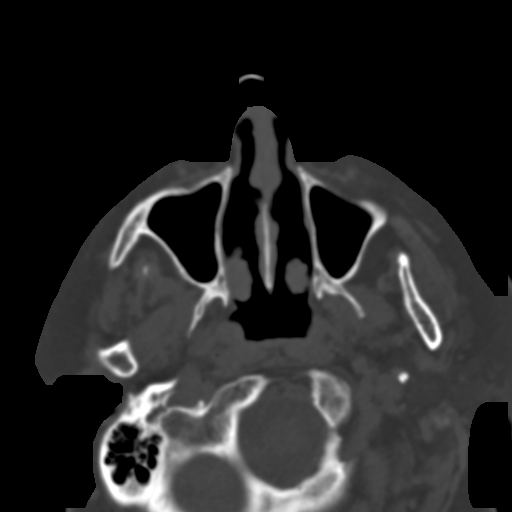
[im 55/88  bone]
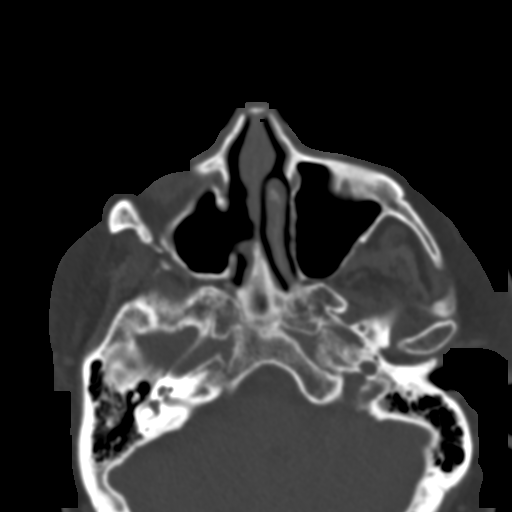
[im 64/88  bone]
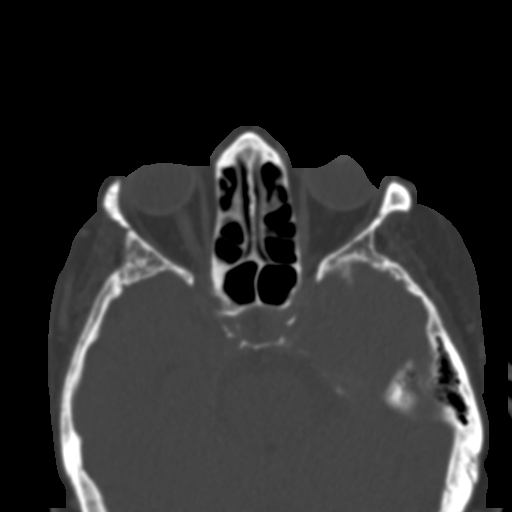
[im 73/88  bone]
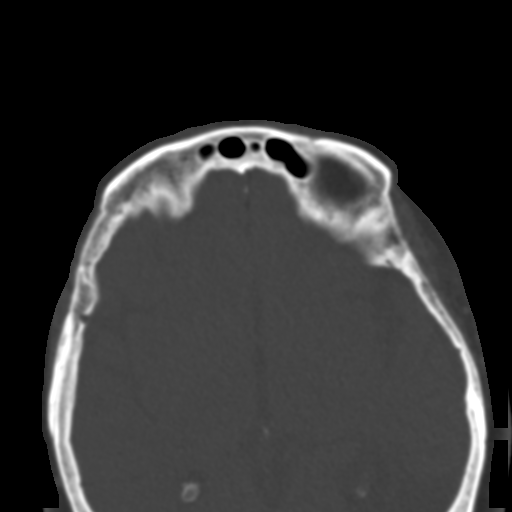
[im 82/88  brain]
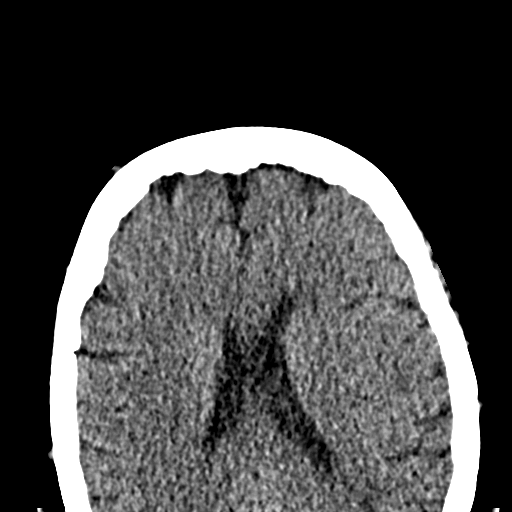
[im 82/88  bone]
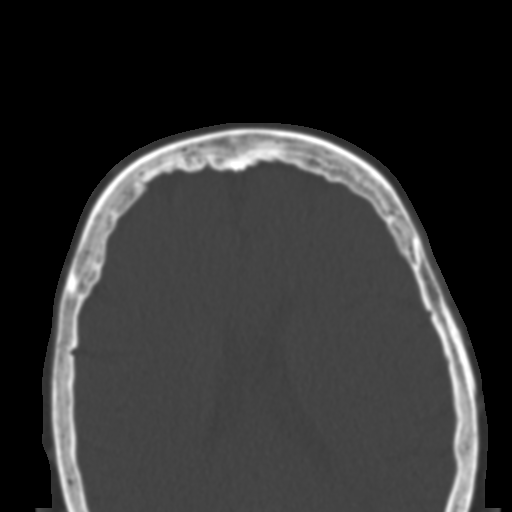

[Series 6: coronal soft · coronal · 0.37mm/px · 3 of 107 slices shown]
[im 36/107  bone]
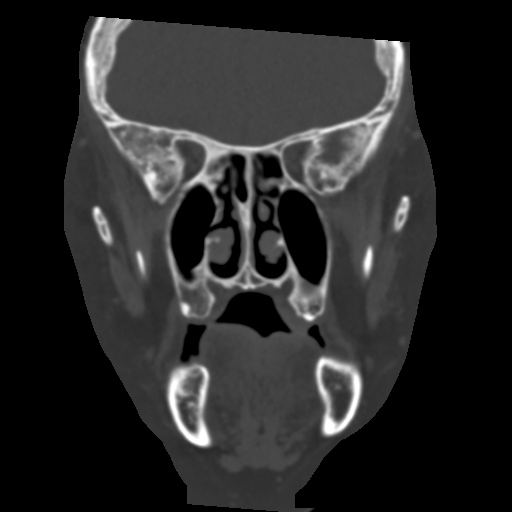
[im 48/107  bone]
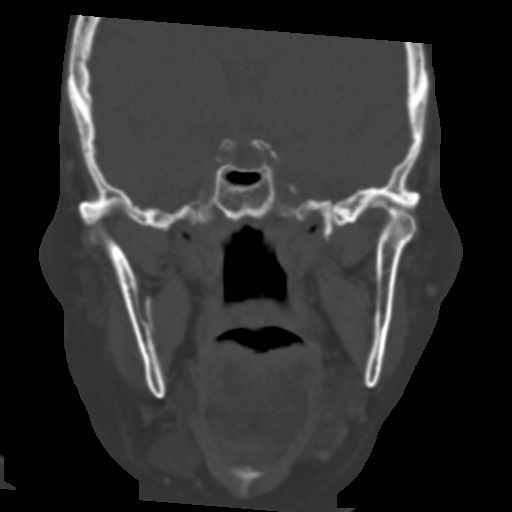
[im 59/107  bone]
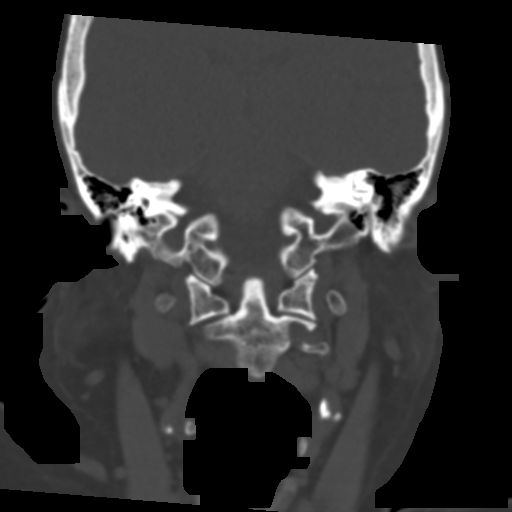

[Series 7: sagittal soft · sagittal · 0.37mm/px · 3 of 95 slices shown]
[im 32/95  bone]
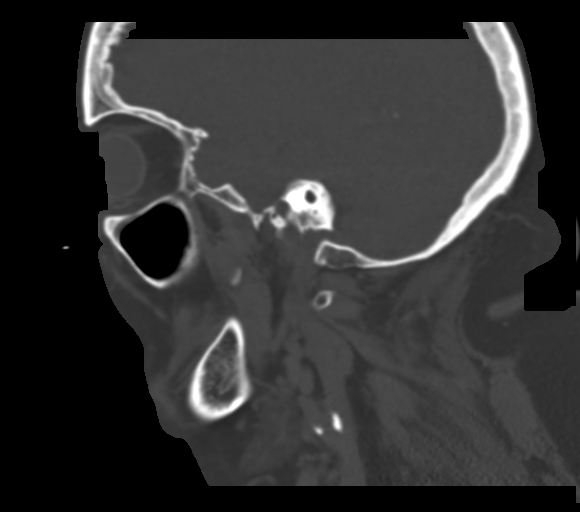
[im 48/95  bone]
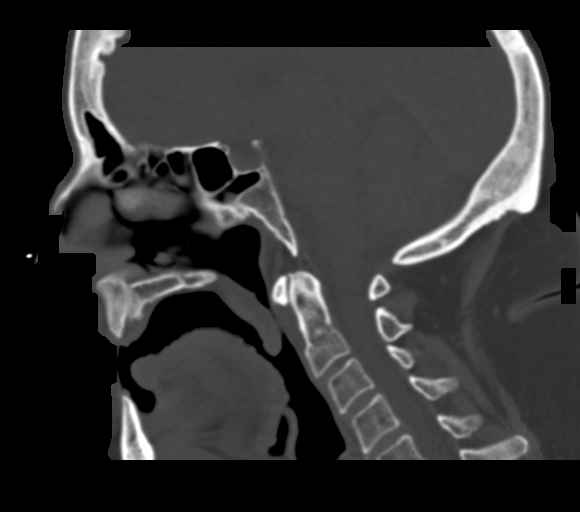
[im 63/95  bone]
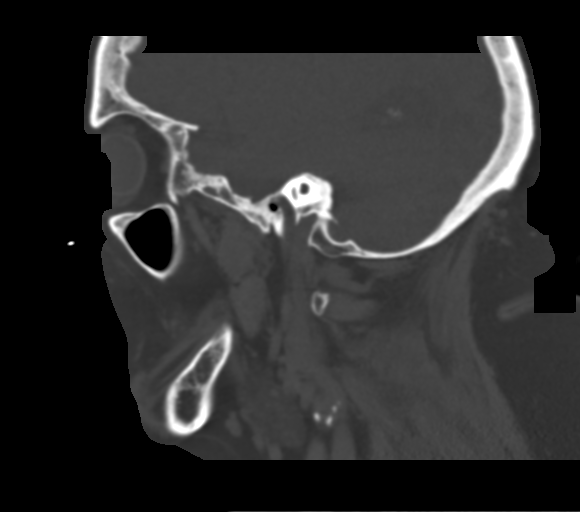

[15 of 47 positions shown; findings below may reference images not displayed]

FINDINGS: CT HEAD FINDINGS

Brain: Stable old left posterior watershed distribution cerebral
infarct. Stable normal size and position of the ventricles. No
intracranial hemorrhage, mass lesion or CT evidence of acute
infarction.

Vascular: No hyperdense vessel or unexpected calcification.

Skull: Normal. Negative for fracture or focal lesion.

Other: None.

CT MAXILLOFACIAL FINDINGS

Osseous: No fracture or mandibular dislocation. No destructive
process.

Orbits: Negative. No traumatic or inflammatory finding.

Sinuses: Stable bilateral maxillary sinus retention cysts and
surgical absence of the medial wall of the right maxillary sinus.

Soft tissues: Unremarkable.

CT CERVICAL SPINE FINDINGS

Alignment: Stable straightening of the normal cervical lordosis
without subluxation.

Skull base and vertebrae: No acute fracture. No primary bone lesion
or focal pathologic process.

Soft tissues and spinal canal: No prevertebral fluid or swelling. No
visible canal hematoma.

Disc levels:  Stable degenerative changes at multiple levels.

Upper chest: Clear lung apices.

Other: Bilateral carotid artery atheromatous calcifications.
IMPRESSION: 1. No skull fracture or intracranial hemorrhage.
2. No maxillofacial fracture.
3. No cervical spine fracture or subluxation.
4. Stable old left posterior watershed cerebral infarct.

## 2023-08-16 IMAGING — MR MR HEAD W/O CM
12 series · 46 of 48 positions shown · non-contrast
Comparison: November 2020

CLINICAL DATA: Neuro deficit, acute, stroke suspected

EXAM:
MRI HEAD WITHOUT CONTRAST
TECHNIQUE: Multiplanar, multiecho pulse sequences of the brain and surrounding
structures were obtained without intravenous contrast.

[Series 5: ax dwi_tracew · axial · 3.0mm · 0.65mm/px · z∈[-117,+37]mm · 3 of 48 slices shown]
[im 1/48]
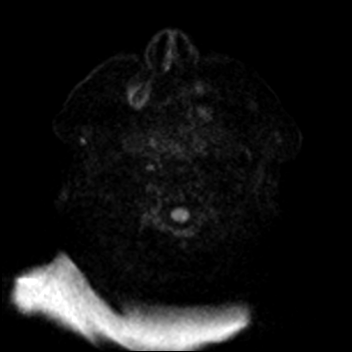
[im 24/48]
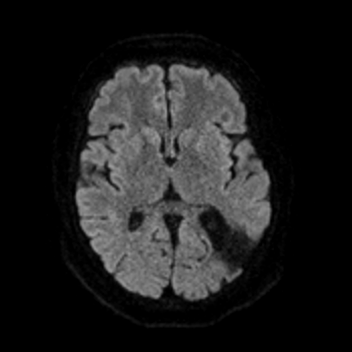
[im 48/48]
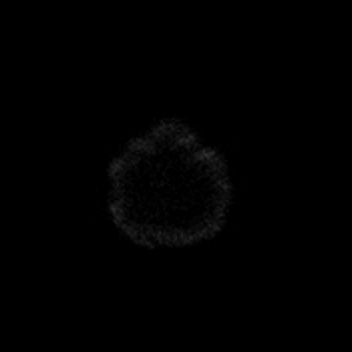

[Series 6: ax dwi_adc · axial · 3.0mm · 0.65mm/px · z∈[-117,+37]mm · 4 of 48 slices shown]
[im 1/48]
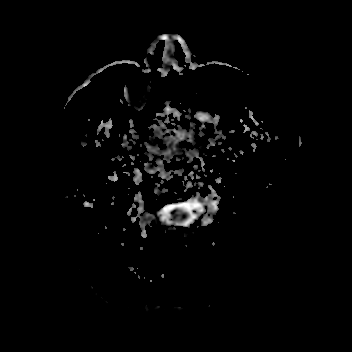
[im 16/48]
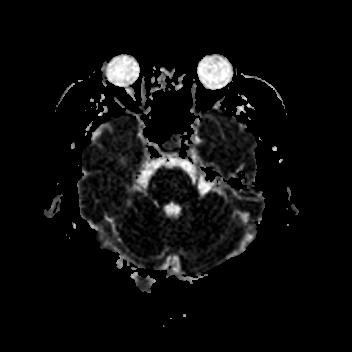
[im 32/48]
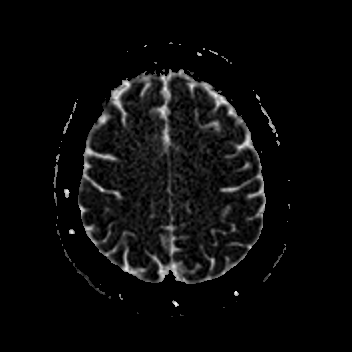
[im 48/48]
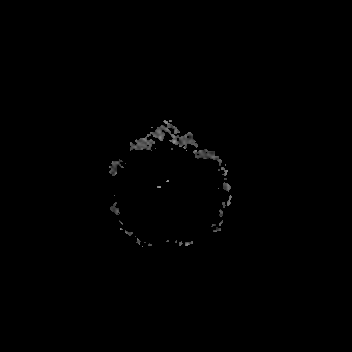

[Series 7: cor dwi_tracew · coronal · 5.0mm · 0.65mm/px · 3 of 40 slices shown]
[im 1/40]
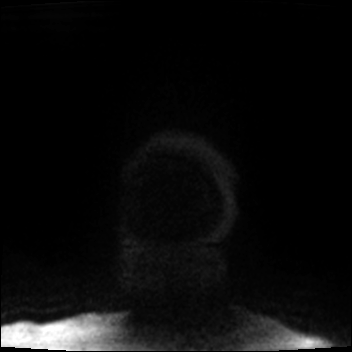
[im 20/40]
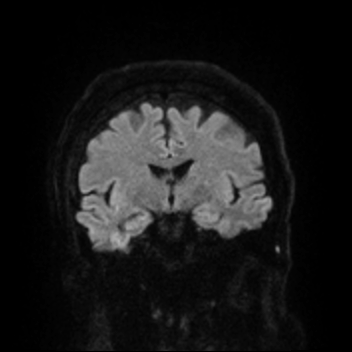
[im 40/40]
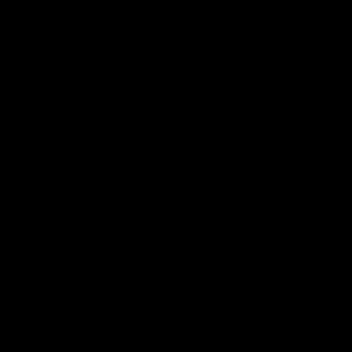

[Series 8: cor dwi_adc · coronal · 5.0mm · 0.65mm/px · 3 of 36 slices shown]
[im 1/36]
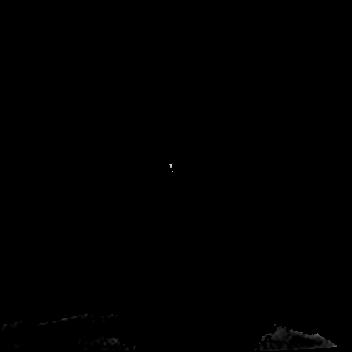
[im 18/36]
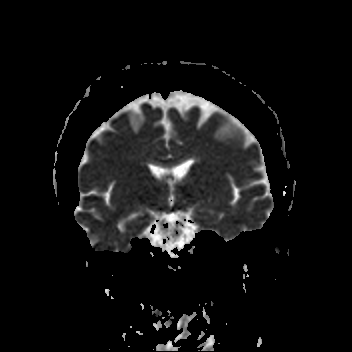
[im 36/36]
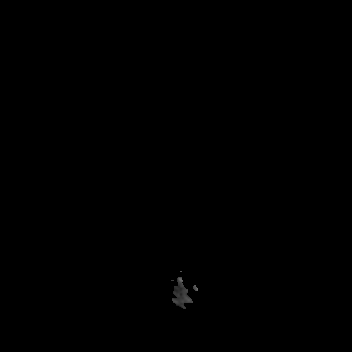

[Series 9: T1 · sagittal · 5.0mm · 0.62mm/px · 2 of 25 slices shown (1 of 2)]
[im 1/25]
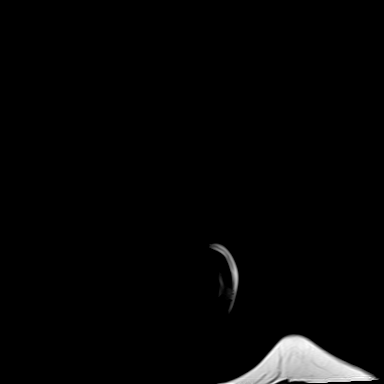
[im 25/25]
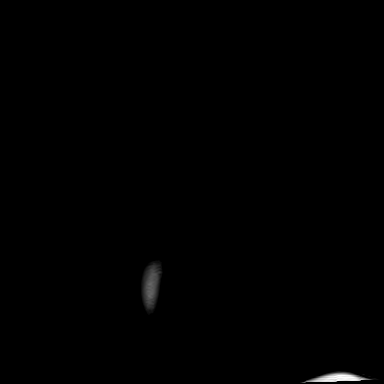

[Series 10: T2 · axial · 5.0mm · 0.53mm/px · z∈[-114,+34]mm · 2 of 26 slices shown (1 of 2)]
[im 1/26]
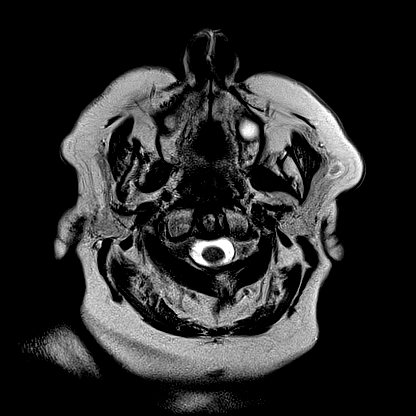
[im 26/26]
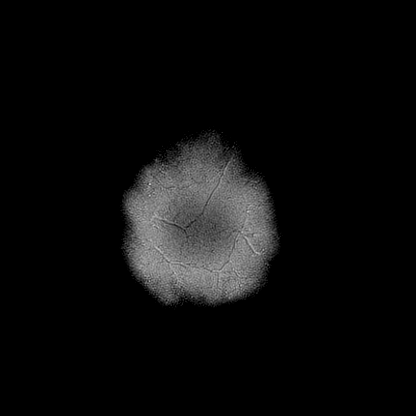

[Series 12: pha_images · axial · 3.0mm · 0.90mm/px · z∈[-127,+48]mm · 5 of 60 slices shown]
[im 1/60]
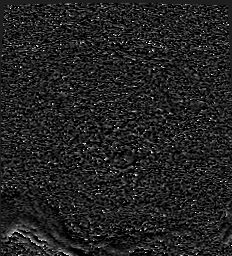
[im 15/60]
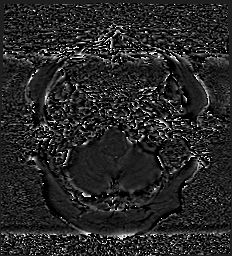
[im 30/60]
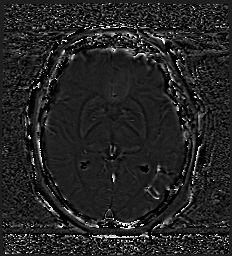
[im 45/60]
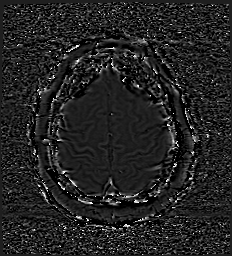
[im 60/60]
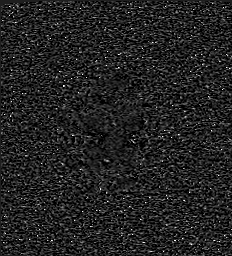

[Series 13: swi_images · axial · 3.0mm · 0.90mm/px · z∈[-127,+48]mm · 5 of 60 slices shown]
[im 1/60]
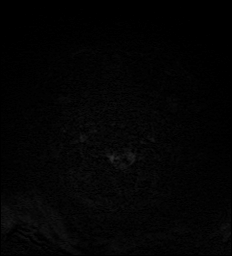
[im 15/60]
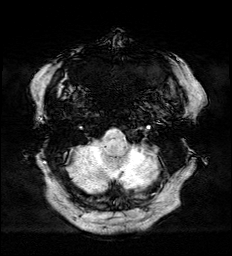
[im 30/60]
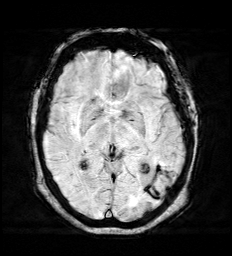
[im 45/60]
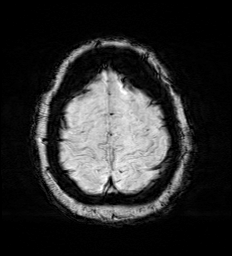
[im 60/60]
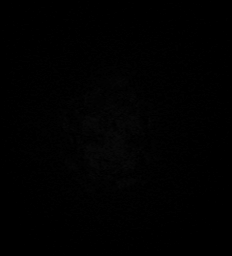

[Series 15: FLAIR · axial · 3.0mm · 0.53mm/px · z∈[-120,+40]mm · 4 of 55 slices shown (1 of 2)]
[im 1/55]
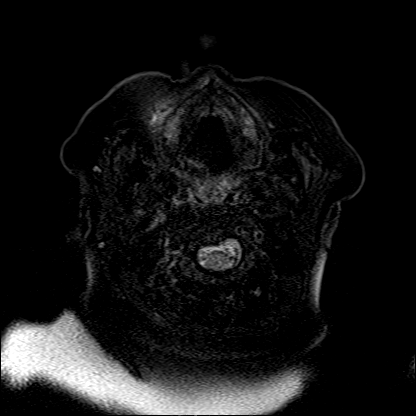
[im 19/55]
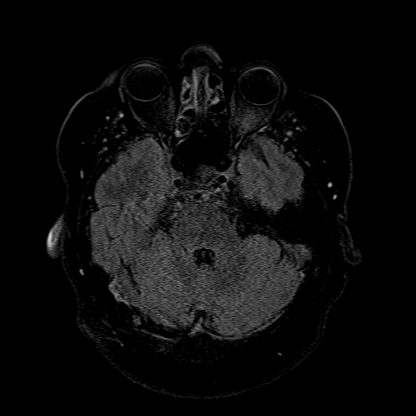
[im 37/55]
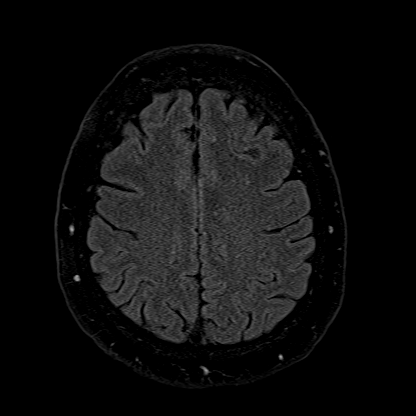
[im 55/55]
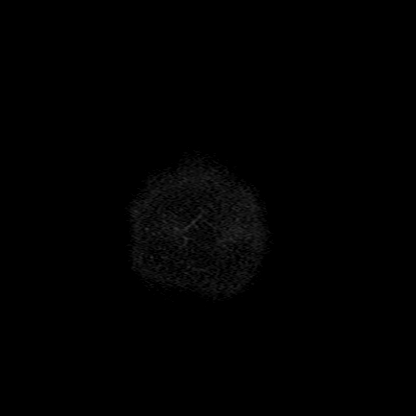

[Series 16: T1 · axial · 1.0mm · 0.98mm/px · z∈[-125,+48]mm · 11 of 176 slices shown (2 of 2)]
[im 1/176]
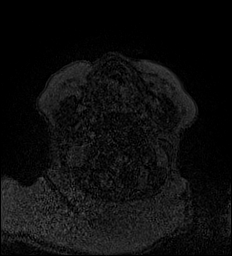
[im 15/176]
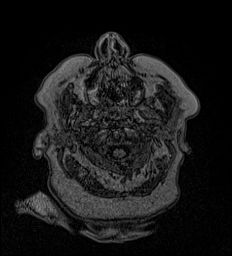
[im 30/176]
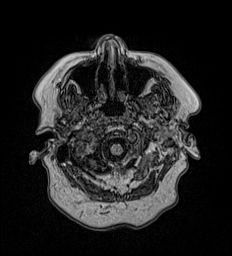
[im 44/176]
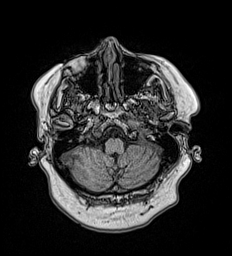
[im 59/176]
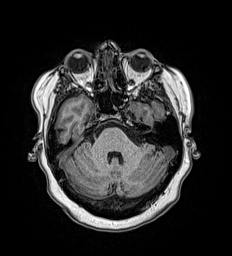
[im 73/176]
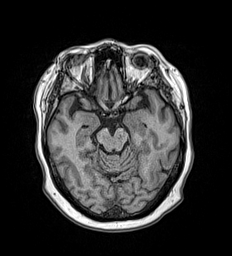
[im 88/176]
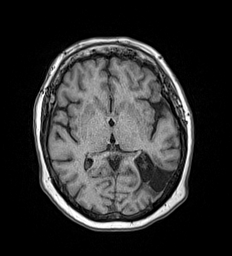
[im 103/176]
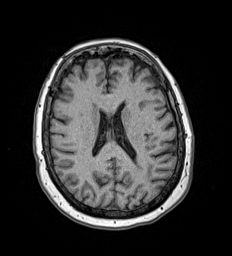
[im 117/176]
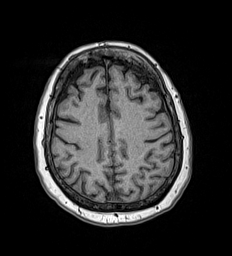
[im 146/176]
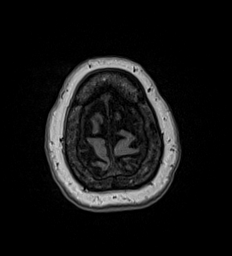
[im 176/176]
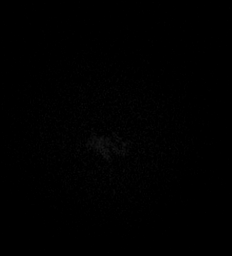

[Series 17: FLAIR · axial · 5.0mm · 1.20mm/px · z∈[-78,+78]mm · 2 of 26 slices shown (2 of 2)]
[im 1/26]
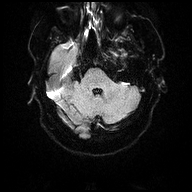
[im 26/26]
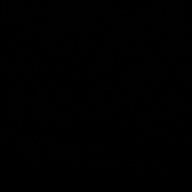

[Series 18: T2 · coronal · 5.0mm · 0.45mm/px · 2 of 31 slices shown (2 of 2)]
[im 1/31]
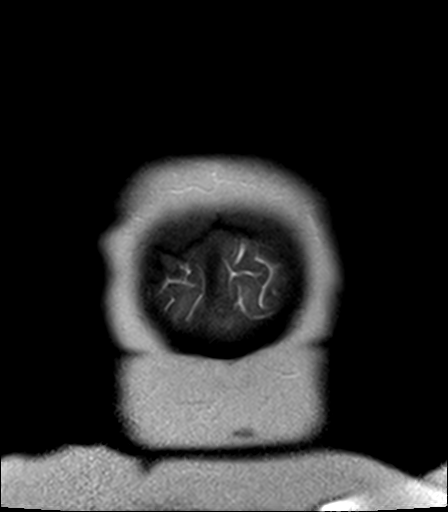
[im 31/31]
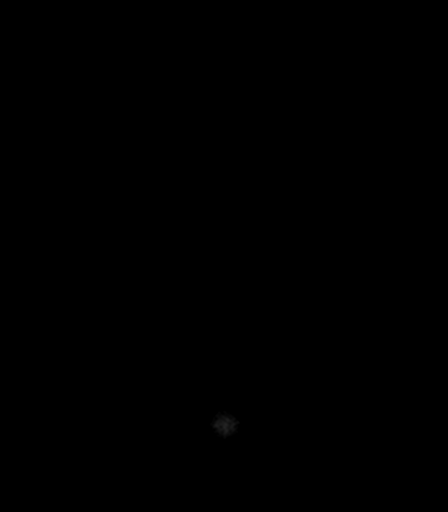

[46 of 48 positions shown; findings below may reference images not displayed]

FINDINGS: Brain: There is no acute infarction or intracranial hemorrhage.
There is no intracranial mass, mass effect, or edema. There is no
hydrocephalus or extra-axial fluid collection.

Encephalomalacia involving posterior left temporal and lateral
occipital lobes with chronic blood products. Ventricles and sulci
are within normal limits in size and configuration apart from ex
vacuo dilatation of the posterior left lateral ventricle. Unchanged
patchy additional foci of T2 hyperintensity in the supratentorial
white matter that may reflect minor chronic microvascular ischemic
changes.

Vascular: Major vessel flow voids at the skull base are preserved.

Skull and upper cervical spine: Normal marrow signal is preserved.

Sinuses/Orbits: Minor mucosal thickening.  Orbits are unremarkable.

Other: Sella is partially empty.  Mastoid air cells are clear.
IMPRESSION: No evidence of recent infarction, hemorrhage, or mass. Stable
chronic findings detailed above.

## 2023-08-16 IMAGING — CT CT HEAD W/O CM
3 series · 15 of 47 positions shown, 18 images · non-contrast
Comparison: Head and cervical spine CT dated 07/14/2021.
Maxillofacial CT dated 05/11/2020.

CLINICAL DATA: Nasal and left facial swelling following a fall onto
her face this morning. Concussion diagnosed after a fall last week.

EXAM:
CT HEAD WITHOUT CONTRAST
CT MAXILLOFACIAL WITHOUT CONTRAST
CT CERVICAL SPINE WITHOUT CONTRAST
TECHNIQUE: Multidetector CT imaging of the head, cervical spine, and
maxillofacial structures were performed using the standard protocol
without intravenous contrast. Multiplanar CT image reconstructions
of the cervical spine and maxillofacial structures were also
generated.

[Series 2: head wo · axial · 0.43mm/px · z∈[-163,-33]mm · 9 of 32 slices shown, 12 images]
[im 3/32  brain]
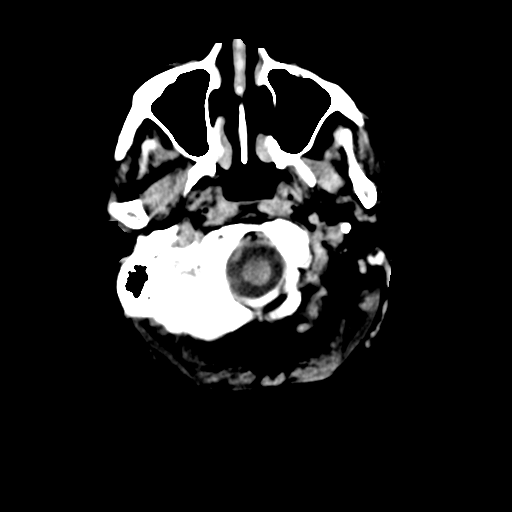
[im 3/32  bone]
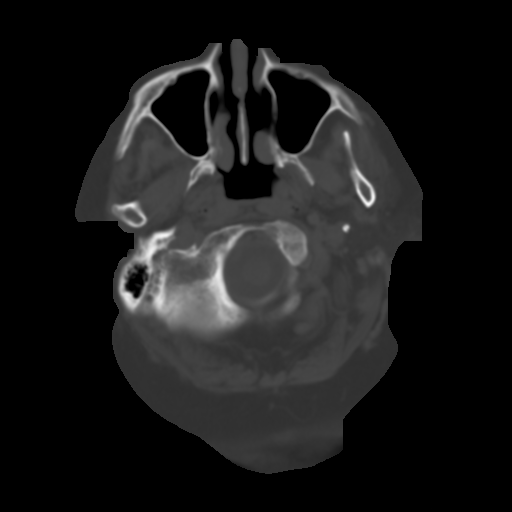
[im 6/32  brain]
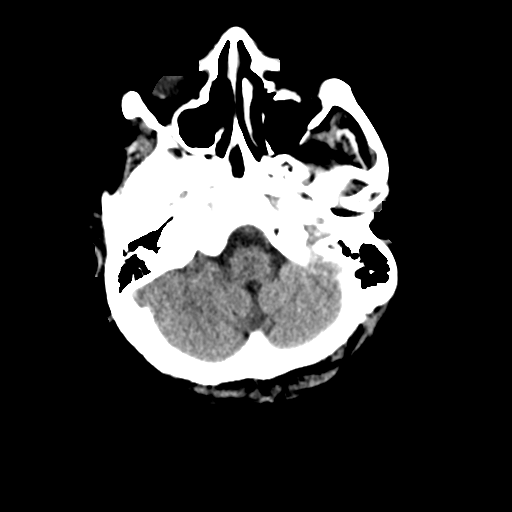
[im 9/32  brain]
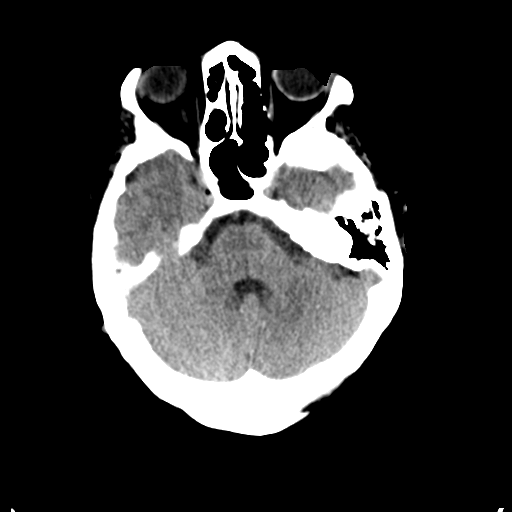
[im 12/32  brain]
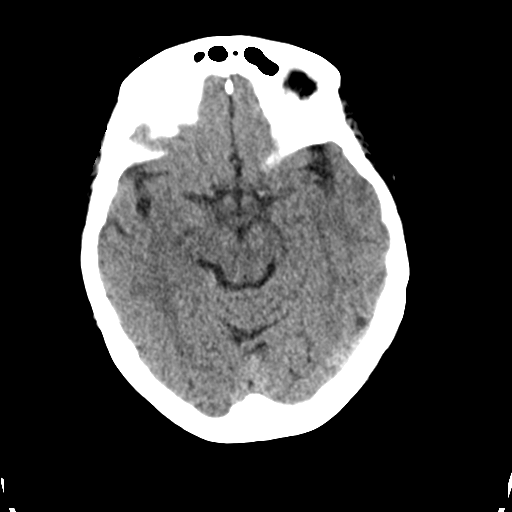
[im 17/32  brain]
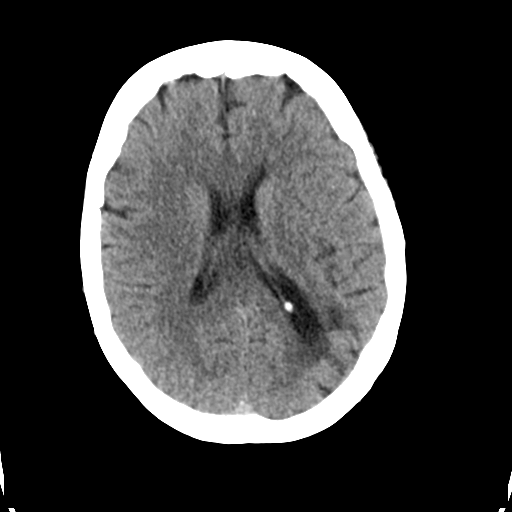
[im 17/32  bone]
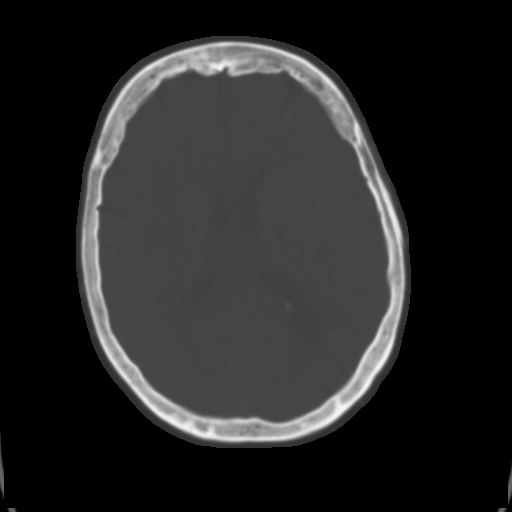
[im 20/32  brain]
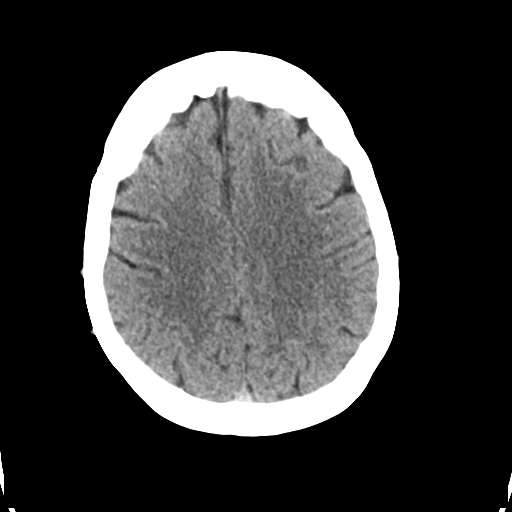
[im 23/32  brain]
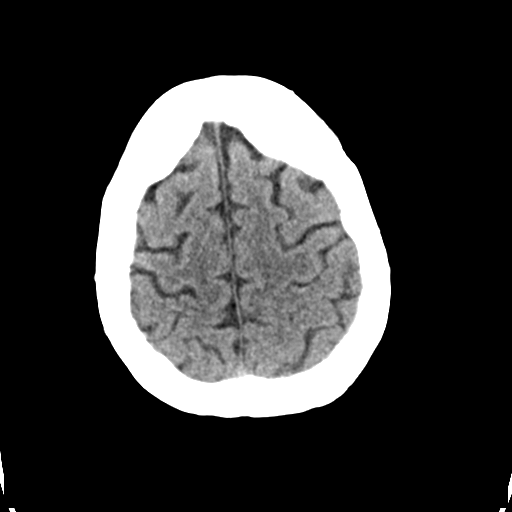
[im 26/32  brain]
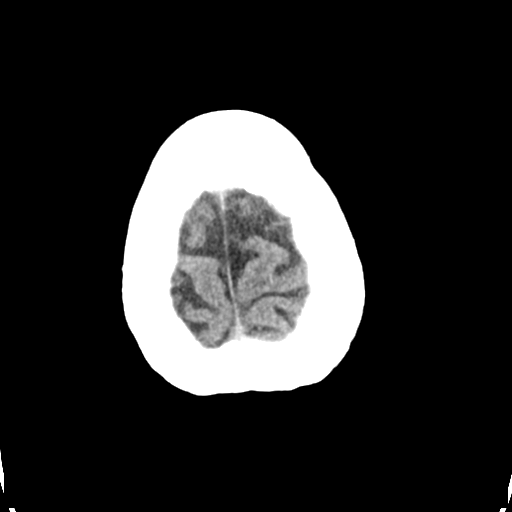
[im 29/32  brain]
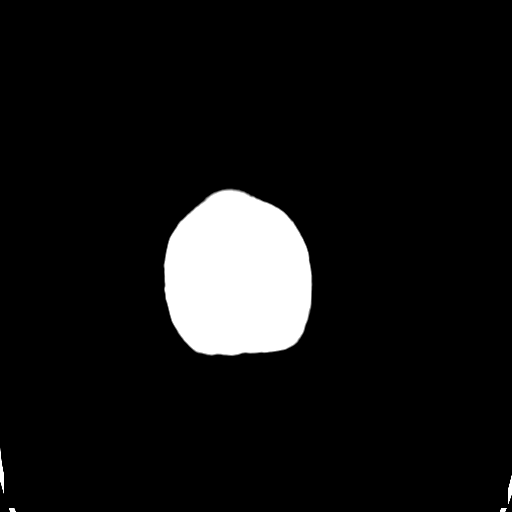
[im 29/32  bone]
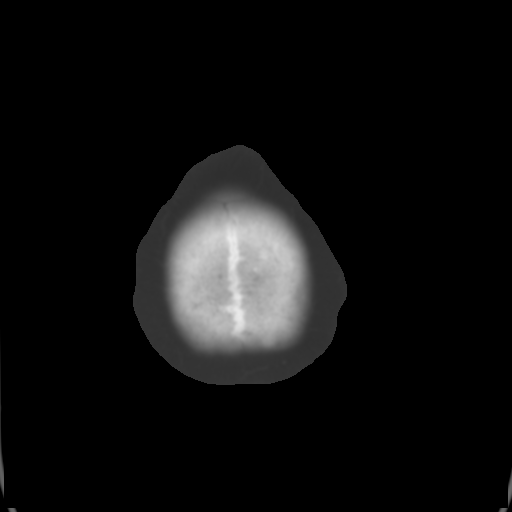

[Series 4: coronal soft tissue · coronal · 0.31mm/px · 3 of 69 slices shown]
[im 23/69  brain]
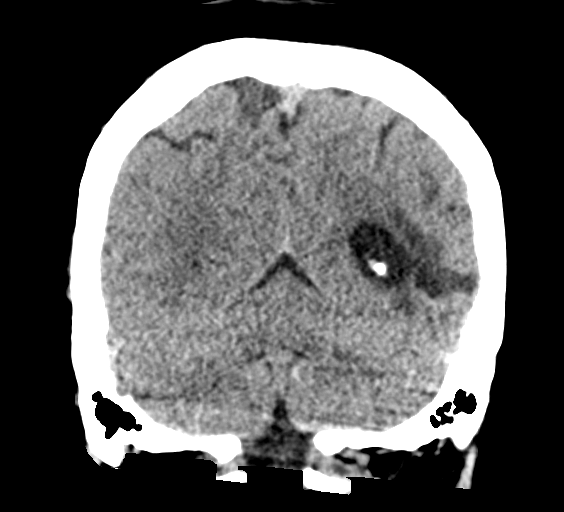
[im 31/69  brain]
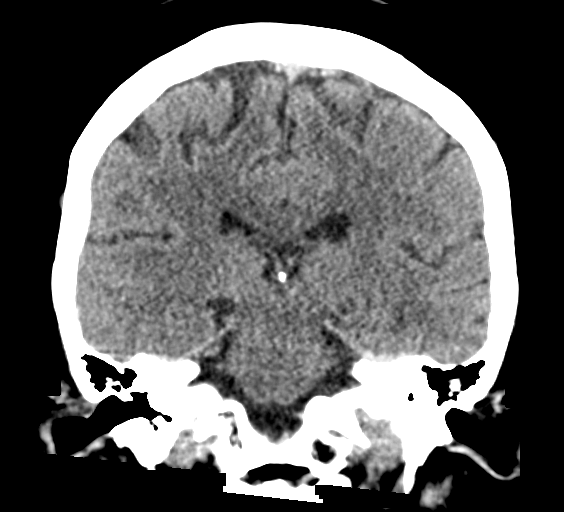
[im 38/69  brain]
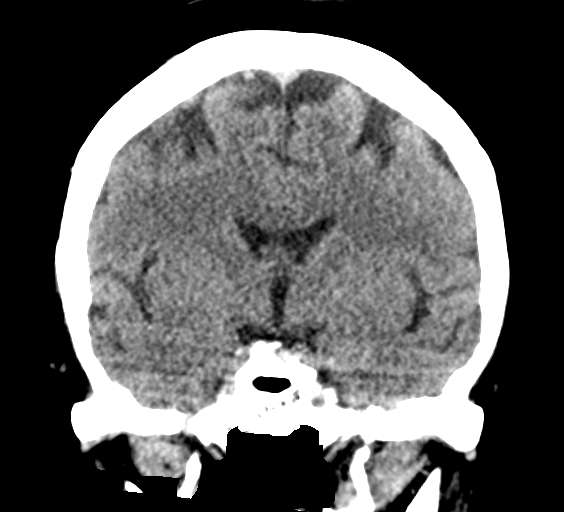

[Series 5: sagittal soft tissue · sagittal · 0.31mm/px · 3 of 58 slices shown]
[im 20/58  brain]
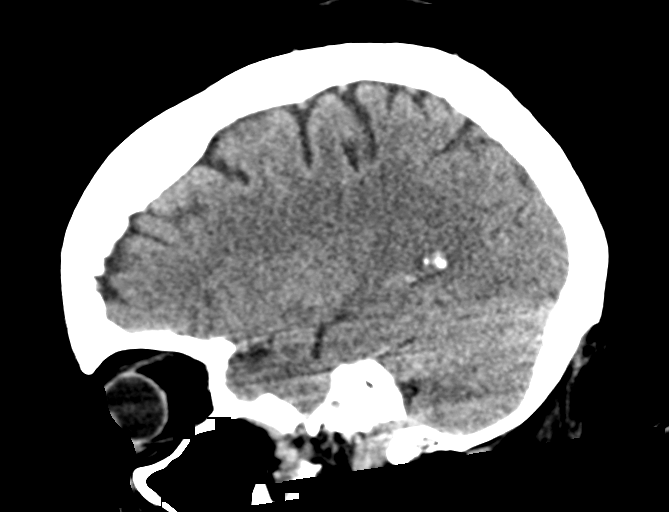
[im 29/58  brain]
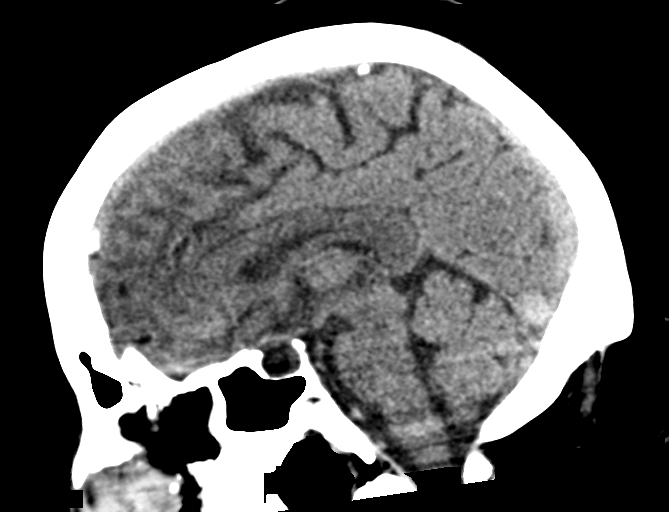
[im 39/58  brain]
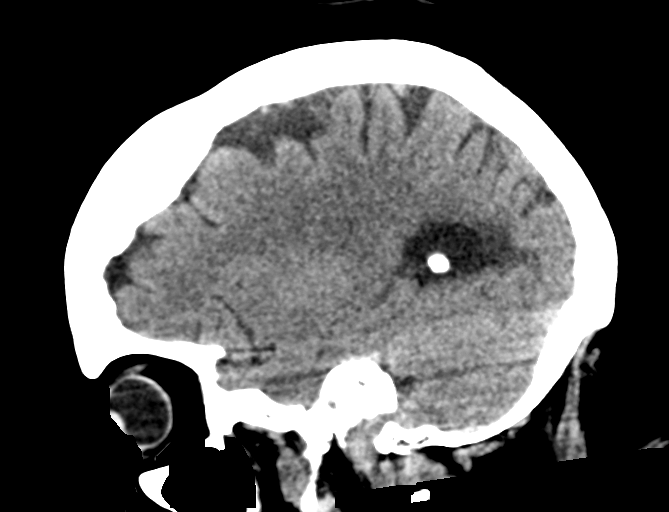

[15 of 47 positions shown; findings below may reference images not displayed]

FINDINGS: CT HEAD FINDINGS

Brain: Stable old left posterior watershed distribution cerebral
infarct. Stable normal size and position of the ventricles. No
intracranial hemorrhage, mass lesion or CT evidence of acute
infarction.

Vascular: No hyperdense vessel or unexpected calcification.

Skull: Normal. Negative for fracture or focal lesion.

Other: None.

CT MAXILLOFACIAL FINDINGS

Osseous: No fracture or mandibular dislocation. No destructive
process.

Orbits: Negative. No traumatic or inflammatory finding.

Sinuses: Stable bilateral maxillary sinus retention cysts and
surgical absence of the medial wall of the right maxillary sinus.

Soft tissues: Unremarkable.

CT CERVICAL SPINE FINDINGS

Alignment: Stable straightening of the normal cervical lordosis
without subluxation.

Skull base and vertebrae: No acute fracture. No primary bone lesion
or focal pathologic process.

Soft tissues and spinal canal: No prevertebral fluid or swelling. No
visible canal hematoma.

Disc levels:  Stable degenerative changes at multiple levels.

Upper chest: Clear lung apices.

Other: Bilateral carotid artery atheromatous calcifications.
IMPRESSION: 1. No skull fracture or intracranial hemorrhage.
2. No maxillofacial fracture.
3. No cervical spine fracture or subluxation.
4. Stable old left posterior watershed cerebral infarct.

## 2023-08-17 ENCOUNTER — Other Ambulatory Visit: Payer: Self-pay | Admitting: Nurse Practitioner

## 2023-08-17 ENCOUNTER — Other Ambulatory Visit: Payer: Self-pay | Admitting: Internal Medicine

## 2023-08-17 DIAGNOSIS — E1165 Type 2 diabetes mellitus with hyperglycemia: Secondary | ICD-10-CM

## 2023-08-17 DIAGNOSIS — E876 Hypokalemia: Secondary | ICD-10-CM

## 2023-08-17 DIAGNOSIS — K219 Gastro-esophageal reflux disease without esophagitis: Secondary | ICD-10-CM

## 2023-08-17 DIAGNOSIS — I10 Essential (primary) hypertension: Secondary | ICD-10-CM

## 2023-08-17 DIAGNOSIS — M7989 Other specified soft tissue disorders: Secondary | ICD-10-CM

## 2023-08-19 NOTE — Telephone Encounter (Signed)
Requested Prescriptions  Pending Prescriptions Disp Refills   amLODipine (NORVASC) 2.5 MG tablet [Pharmacy Med Name: amLODIPine Besylate 2.5 MG Oral Tablet] 90 tablet 0    Sig: Take 1 tablet by mouth once daily     Cardiovascular: Calcium Channel Blockers 2 Failed - 08/17/2023  7:30 PM      Failed - Last BP in normal range    BP Readings from Last 1 Encounters:  08/01/23 (!) 138/92         Passed - Last Heart Rate in normal range    Pulse Readings from Last 1 Encounters:  08/01/23 (!) 106         Passed - Valid encounter within last 6 months    Recent Outpatient Visits           2 weeks ago Type 2 diabetes mellitus with hyperglycemia, without long-term current use of insulin [facility])   [facility] Margarita Mail, DO   2 weeks ago Primary hypertension   [facility] [place], [place], PA-C   1 month ago Type 2 diabetes mellitus with hyperglycemia, without long-term current use of insulin [facility])   [facility] Margarita Mail, DO   2 months ago Primary hypertension   [facility] Berniece Salines, FNP   3 months ago Hypervolemia, unspecified hypervolemia type   [facility] Margarita Mail, DO       Future Appointments             In 1 week Margarita Mail, DO  [facility]   In 2 months Margarita Mail, DO [facility], [facility]

## 2023-08-19 NOTE — Telephone Encounter (Signed)
Omeprazole changed pharmacy  Requested Prescriptions  Pending Prescriptions Disp Refills   omeprazole (PRILOSEC) 40 MG capsule [Pharmacy Med Name: Omeprazole 40 MG Oral Capsule Delayed Release] 90 capsule 0    Sig: Take 1 capsule by mouth once daily     Gastroenterology: Proton Pump Inhibitors Passed - 08/17/2023  7:30 PM      Passed - Valid encounter within last 12 months    Recent Outpatient Visits           2 weeks ago Type 2 diabetes mellitus with hyperglycemia, without long-term current use of insulin [facility])   [place] [facility] Stephanie Mail, Stephanie [name]   2 weeks ago Primary hypertension   [place] [facility] [place], [place], PA-C   1 month ago Type 2 diabetes mellitus with hyperglycemia, without long-term current use of insulin [facility])   [place] [facility] Stephanie Mail, Stephanie [name]   2 months ago Primary hypertension   [facility] Della Goo F, FNP   3 months ago Hypervolemia, unspecified hypervolemia type   [facility] Stephanie Mail, Stephanie [name]       Future Appointments             In 1 week Stephanie Mail, Stephanie [name] [facility]   In 2 months Stephanie Mail, Stephanie [name] [facility], [facility]             furosemide (LASIX) 40 MG tablet [Pharmacy Med Name: Furosemide 40 MG Oral Tablet] 30 tablet     Sig: TAKE 1 TABLET BY MOUTH TWICE DAILY AS NEEDED FOR  EDEMA  IN  THE  MORNING.  CAN  TAKE  AN  ADDITIONAL  TABLET  IN  THE  AFTERNOON     Cardiovascular:  Diuretics - Loop Failed - 08/17/2023  7:30 PM      Failed - Mg Level in normal range and within 180 days    Magnesium  Date Value Ref Range Status  11/20/2018 2.1 1.7 - 2.4 mg/dL Final    Comment:    Performed at [facility], [address] [place], [place] [address]         Failed - Last BP in normal range    BP Readings from Last 1 Encounters:  08/01/23  (!) 138/92         Passed - K in normal range and within 180 days    Potassium  Date Value Ref Range Status  06/09/2023 4.6 3.5 - 5.1 mmol/L Final  03/30/2014 3.7 3.5 - 5.1 mmol/L Final         Passed - Ca in normal range and within 180 days    Calcium  Date Value Ref Range Status  06/09/2023 9.7 8.9 - 10.3 mg/dL Final   Calcium, Total  Date Value Ref Range Status  03/30/2014 9.4 8.5 - 10.1 mg/dL Final   Calcium, Ion  Date Value Ref Range Status  03/07/2018 0.91 (L) 1.15 - 1.40 mmol/L Final         Passed - Na in normal range and within 180 days    Sodium  Date Value Ref Range Status  06/09/2023 137 135 - 145 mmol/L Final  06/27/2016 147 (H) 134 - 144 mmol/L Final  03/30/2014 135 (L) 136 - 145 mmol/L Final         Passed - Cr in normal range and within 180 days    Creat  Date Value Ref Range  Status  05/19/2023 0.73 0.50 - 1.03 mg/dL Final   Creatinine, Ser  Date Value Ref Range Status  06/09/2023 0.78 0.44 - 1.00 mg/dL Final   Creatinine, Urine  Date Value Ref Range Status  06/30/2023 118 20 - 275 mg/dL Final         Passed - Cl in normal range and within 180 days    Chloride  Date Value Ref Range Status  06/09/2023 100 98 - 111 mmol/L Final  03/30/2014 105 98 - 107 mmol/L Final         Passed - Valid encounter within last 6 months    Recent Outpatient Visits           2 weeks ago Type 2 diabetes mellitus with hyperglycemia, without long-term current use of insulin (HCC)   [facility] Stephanie Mail, Stephanie [name]   2 weeks ago Primary hypertension   [place] [place], [place], PA-C   1 month ago Type 2 diabetes mellitus with hyperglycemia, without long-term current use of insulin [facility])   [place] [facility] Stephanie Mail, Stephanie [name]   2 months ago Primary hypertension   [facility] Berniece Salines, FNP   3 months ago Hypervolemia, unspecified hypervolemia type    [facility] Stephanie Mail, Stephanie [name]       Future Appointments             In 1 week Stephanie Mail, Stephanie [name] [facility]   In 2 months Stephanie Mail, Stephanie [name] [facility], [facility]             KLOR-CON M20 20 MEQ tablet [Pharmacy Med Name: Klor-Con M20 20 MEQ Oral Tablet Extended Release] 90 tablet 0    Sig: TAKE 1 TABLET BY MOUTH TWICE DAILY FOR 3 DAYS THEN  TAKE  ONE  TABLET  DAILY  WITH  LASIX     Endocrinology:  Minerals - Potassium Supplementation Passed - 08/17/2023  7:30 PM      Passed - K in normal range and within 360 days    Potassium  Date Value Ref Range Status  06/09/2023 4.6 3.5 - 5.1 mmol/L Final  03/30/2014 3.7 3.5 - 5.1 mmol/L Final         Passed - Cr in normal range and within 360 days    Creat  Date Value Ref Range Status  05/19/2023 0.73 0.50 - 1.03 mg/dL Final   Creatinine, Ser  Date Value Ref Range Status  06/09/2023 0.78 0.44 - 1.00 mg/dL Final   Creatinine, Urine  Date Value Ref Range Status  06/30/2023 118 20 - 275 mg/dL Final         Passed - Valid encounter within last 12 months    Recent Outpatient Visits           2 weeks ago Type 2 diabetes mellitus with hyperglycemia, without long-term current use of insulin [facility])   [place] [facility] Stephanie Mail, Stephanie [name]   2 weeks ago Primary hypertension   [place] [facility] [place], PA-C   1 month ago Type 2 diabetes mellitus with hyperglycemia, without long-term current use of insulin [facility])   [place] [facility] Stephanie Mail, Stephanie [name]   2 months ago Primary hypertension   [facility] Berniece Salines, FNP   3 months ago Hypervolemia, unspecified hypervolemia type   [facility]  Stephanie Mail, Stephanie [name]       Future Appointments             In 1 week Stephanie Mail, Stephanie [name] [place] [facility], [facility]   In 2 months Stephanie Mail, Stephanie [name] [facility], [facility]             lisinopril (ZESTRIL) 10 MG tablet [place] Med Name: Lisinopril 10 MG Oral Tablet] 30 tablet 0    Sig: Take 1 tablet by mouth once daily     Cardiovascular:  ACE Inhibitors Failed - 08/17/2023  7:30 PM      Failed - Last BP in normal range    BP Readings from Last 1 Encounters:  08/01/23 (!) 138/92         Passed - Cr in normal range and within 180 days    Creat  Date Value Ref Range Status  05/19/2023 0.73 0.50 - 1.03 mg/dL Final   Creatinine, Ser  Date Value Ref Range Status  06/09/2023 0.78 0.44 - 1.00 mg/dL Final   Creatinine, Urine  Date Value Ref Range Status  06/30/2023 118 20 - 275 mg/dL Final         Passed - K in normal range and within 180 days    Potassium  Date Value Ref Range Status  06/09/2023 4.6 3.5 - 5.1 mmol/L Final  03/30/2014 3.7 3.5 - 5.1 mmol/L Final         Passed - Patient is not pregnant      Passed - Valid encounter within last 6 months    Recent Outpatient Visits           2 weeks ago Type 2 diabetes mellitus with hyperglycemia, without long-term current use of insulin (HCC)   [place] [facility] Stephanie Mail, Stephanie [name]   2 weeks ago Primary hypertension   [place] [facility], [place], PA-C   1 month ago Type 2 diabetes mellitus with hyperglycemia, without long-term current use of insulin (HCC)   [facility] Stephanie Mail, Stephanie [name]   2 months ago Primary hypertension   [place] [facility] Berniece Salines, FNP   3 months ago Hypervolemia, unspecified hypervolemia type   [facility] Stephanie Mail, Stephanie [name]       Future Appointments             In 1 week Stephanie Mail, Stephanie [name] [facility]   In 2 months Stephanie Mail, Stephanie [name] [place] [facility], [facility]             Refused Prescriptions Disp Refills   pioglitazone (ACTOS) 15 MG tablet [Pharmacy Med Name: Pioglitazone HCl 15 MG Oral Tablet] 90 tablet     Sig: Take 1 tablet by mouth once daily     Endocrinology:  Diabetes - Glitazones - pioglitazone Failed - 08/17/2023  7:30 PM      Failed - HBA1C is between 0 and 7.9 and within 180 days    Hemoglobin A1C  Date Value Ref Range Status  08/01/2023 8.9 (A) 4.0 - 5.6 % Final   Hgb A1c MFr Bld  Date Value Ref Range Status  04/15/2023 7.2 (H) <5.7 % of total Hgb Final    Comment:    For someone without known diabetes, a hemoglobin A1c value of 6.5% or greater indicates that they may have  diabetes and this should be confirmed with a follow-up  test. .  For someone with known diabetes, a value <7% indicates  that their diabetes is well controlled and a value  greater than or equal to 7% indicates suboptimal  control. A1c targets should be individualized based on  duration of diabetes, age, comorbid conditions, and  other considerations. . Currently, no consensus exists regarding use of hemoglobin A1c for diagnosis of diabetes for children. Stephanie [name] - Valid encounter within last 6 months    Recent Outpatient Visits           2 weeks ago Type 2 diabetes mellitus with hyperglycemia, without long-term current use of insulin (HCC)   [place] Stephanie Mail, Stephanie [name]   2 weeks ago Primary hypertension   [place] [facility] [place], [place], PA-C   1 month ago Type 2 diabetes mellitus with hyperglycemia, without long-term current use of insulin [facility])   [place] [facility] Stephanie Mail, Stephanie [name]   2 months ago Primary hypertension   [place] [facility] Berniece Salines, FNP   3 months ago Hypervolemia, unspecified hypervolemia type   [facility] Stephanie Mail, Stephanie [name]       Future Appointments             In 1 week  Stephanie Mail, Stephanie [name] [facility]   In 2 months Stephanie Mail, Stephanie [name] [place] [facility], [facility]             metFORMIN (GLUCOPHAGE-XR) 500 MG 24 hr tablet [Pharmacy Med Name: metFORMIN HCl ER 500 MG Oral Tablet Extended Release 24 Hour] 180 tablet     Sig: TAKE 1 TABLET BY MOUTH TWICE DAILY WITH A MEAL     Endocrinology:  Diabetes - Biguanides Failed - 08/17/2023  7:30 PM      Failed - HBA1C is between 0 and 7.9 and within 180 days    Hemoglobin A1C  Date Value Ref Range Status  08/01/2023 8.9 (A) 4.0 - 5.6 % Final   Hgb A1c MFr Bld  Date Value Ref Range Status  04/15/2023 7.2 (H) <5.7 % of total Hgb Final    Comment:    For someone without known diabetes, a hemoglobin A1c value of 6.5% or greater indicates that they may have  diabetes and this should be confirmed with a follow-up  test. . For someone with known diabetes, a value <7% indicates  that their diabetes is well controlled and a value  greater than or equal to 7% indicates suboptimal  control. A1c targets should be individualized based on  duration of diabetes, age, comorbid conditions, and  other considerations. . Currently, no consensus exists regarding use of hemoglobin A1c for diagnosis of diabetes for children. .          Passed - Cr in normal range and within 360 days    Creat  Date Value Ref Range Status  05/19/2023 0.73 0.50 - 1.03 mg/dL Final   Creatinine, Ser  Date Value Ref Range Status  06/09/2023 0.78 0.44 - 1.00 mg/dL Final   Creatinine, Urine  Date Value Ref Range Status  06/30/2023 118 20 - 275 mg/dL Final         Passed - eGFR in normal range and within 360 days    EGFR (African American)  Date Value Ref Range Status  03/30/2014 >60  Final   GFR calc Af Denyse Dago  Date Value Ref Range Status  04/20/2020 >60 >60 mL/min Final   EGFR (Non-African Amer.)  Date Value Ref Range Status  03/30/2014 >60  Final    Comment:    eGFR values  <62mL/min/1.73 m2 may be an indication of chronic kidney disease (CKD). Calculated eGFR is useful in patients with stable renal function. The eGFR calculation will not be reliable in acutely ill patients when serum creatinine is changing rapidly. It is not useful in  patients on dialysis. The eGFR calculation may not be applicable to patients at the low and high extremes of body sizes, pregnant women, and vegetarians.    GFR, Estimated  Date Value Ref Range Status  06/09/2023 >60 >60 mL/min Final    Comment:    (NOTE) Calculated using the CKD-EPI Creatinine Equation (2021)    eGFR  Date Value Ref Range Status  03/29/2022 83 > OR = 60 mL/min/1.85m2 Final    Comment:    The eGFR is based on the CKD-EPI 2021 equation. To calculate  the new eGFR from a previous Creatinine or Cystatin C result, go to https://www.kidney.org/professionals/ kdoqi/gfr%5Fcalculator          Passed - B12 Level in normal range and within 720 days    Vitamin B-12  Date Value Ref Range Status  02/20/2022 380 200 - 1,100 pg/mL Final    Comment:    . Please Note: Although the reference range for vitamin B12 is [phone] pg/mL, it has been reported that between 5 and 10% of patients with values between 200 and 400 pg/mL may experience neuropsychiatric and hematologic abnormalities due to occult B12 deficiency; less than 1% of patients with values above 400 pg/mL will have symptoms. Stephanie [name] - Valid encounter within last 6 months    Recent Outpatient Visits           2 weeks ago Type 2 diabetes mellitus with hyperglycemia, without long-term current use of insulin [facility])   [place] [facility] Stephanie Mail, Stephanie [name]   2 weeks ago Primary hypertension   [place] [facility] [place], [place], PA-C   1 month ago Type 2 diabetes mellitus with hyperglycemia, without long-term current use of insulin [facility])   [place] Stephanie Mail, Stephanie [name]   2 months ago Primary hypertension   [place] [facility] Berniece Salines, FNP   3 months ago Hypervolemia, unspecified hypervolemia type   [facility] Stephanie Mail, Stephanie [name]       Future Appointments             In 1 week Stephanie Mail, Stephanie [name] [place] [facility]   In 2 months Stephanie Mail, Stephanie [name] [place] [facility], [facility]            Passed - CBC within normal limits and completed in the last 12 months    WBC  Date Value Ref Range Status  06/09/2023 7.7 4.0 - 10.5 K/uL Final   RBC  Date Value Ref Range Status  06/09/2023 4.57 3.87 - 5.11 MIL/uL Final   Hemoglobin  Date Value Ref Range Status  06/09/2023 13.7 12.0 - 15.0 g/dL Final  19/14/7829 56.2 11.1 - 15.9 g/dL Final   HCT  Date Value Ref Range Status  06/09/2023 41.4 36.0 - 46.0 % Final   Hematocrit  Date Value Ref Range Status  06/27/2016 44.9 34.0 - 46.6 % Final   MCHC  Date Value Ref  Range Status  06/09/2023 33.1 30.0 - 36.0 g/dL Final   [facility]  Date Value Ref Range Status  06/09/2023 30.0 26.0 - 34.0 pg Final   MCV  Date Value Ref Range Status  06/09/2023 90.6 80.0 - 100.0 fL Final  06/27/2016 95 79 - 97 fL Final  03/30/2014 94 80 - 100 fL Final   No results found for: "PLTCOUNTKUC", "LABPLAT", "POCPLA" RDW  Date Value Ref Range Status  06/09/2023 12.7 11.5 - 15.5 % Final  06/27/2016 13.9 12.3 - 15.4 % Final  03/30/2014 13.8 11.5 - 14.5 % Final

## 2023-08-19 NOTE — Telephone Encounter (Signed)
Requested medication (s) are due for refill today: yes  Requested medication (s) are on the active medication list: yes  Last refill:  06/11/23 #30  Future visit scheduled: yes  Notes to clinic:  overdue lab work    Requested Prescriptions  Pending Prescriptions Disp Refills   furosemide (LASIX) 40 MG tablet [Pharmacy Med Name: Furosemide 40 MG Oral Tablet] 30 tablet     Sig: TAKE 1 TABLET BY MOUTH TWICE DAILY AS NEEDED FOR  EDEMA  IN  THE  MORNING.  CAN  TAKE  AN  ADDITIONAL  TABLET  IN  THE  AFTERNOON     Cardiovascular:  Diuretics - Loop Failed - 08/17/2023  7:30 PM      Failed - Mg Level in normal range and within 180 days    Magnesium  Date Value Ref Range Status  11/20/2018 2.1 1.7 - 2.4 mg/dL Final    Comment:    Performed at [facility], [address] [place], [place] [address]         Failed - Last BP in normal range    BP Readings from Last 1 Encounters:  08/01/23 (!) 138/92         Passed - K in normal range and within 180 days    Potassium  Date Value Ref Range Status  06/09/2023 4.6 3.5 - 5.1 mmol/L Final  03/30/2014 3.7 3.5 - 5.1 mmol/L Final         Passed - Ca in normal range and within 180 days    Calcium  Date Value Ref Range Status  06/09/2023 9.7 8.9 - 10.3 mg/dL Final   Calcium, Total  Date Value Ref Range Status  03/30/2014 9.4 8.5 - 10.1 mg/dL Final   Calcium, Ion  Date Value Ref Range Status  03/07/2018 0.91 (L) 1.15 - 1.40 mmol/L Final         Passed - Na in normal range and within 180 days    Sodium  Date Value Ref Range Status  06/09/2023 137 135 - 145 mmol/L Final  06/27/2016 147 (H) 134 - 144 mmol/L Final  03/30/2014 135 (L) 136 - 145 mmol/L Final         Passed - Cr in normal range and within 180 days    Creat  Date Value Ref Range Status  05/19/2023 0.73 0.50 - 1.03 mg/dL Final   Creatinine, Ser  Date Value Ref Range Status  06/09/2023 0.78 0.44 - 1.00 mg/dL Final   Creatinine, Urine  Date Value Ref Range  Status  06/30/2023 118 20 - 275 mg/dL Final         Passed - Cl in normal range and within 180 days    Chloride  Date Value Ref Range Status  06/09/2023 100 98 - 111 mmol/L Final  03/30/2014 105 98 - 107 mmol/L Final         Passed - Valid encounter within last 6 months    Recent Outpatient Visits           2 weeks ago Type 2 diabetes mellitus with hyperglycemia, without long-term current use of insulin [facility])   [place] [facility] Margarita Mail, DO   2 weeks ago Primary hypertension   [place] [facility] [place], [place], PA-C   1 month ago Type 2 diabetes mellitus with hyperglycemia, without long-term current use of insulin [facility])   [place] [facility] Margarita Mail, DO   2 months ago Primary hypertension  [facility] Della Goo F, FNP   3 months ago Hypervolemia, unspecified hypervolemia type   [facility]arita Mail, DO       Future Appointments             In 1 week Margarita Mail, DO [place] [facility]   In 2 months Margarita Mail, DO [place] [facility], [facility]            Signed Prescriptions Disp Refills   omeprazole (PRILOSEC) 40 MG capsule 90 capsule 0    Sig: Take 1 capsule by mouth once daily     Gastroenterology: Proton Pump Inhibitors Passed - 08/17/2023  7:30 PM      Passed - Valid encounter within last 12 months    Recent Outpatient Visits           2 weeks ago Type 2 diabetes mellitus with hyperglycemia, without long-term current use of insulin (HCC)   [place] [facility] Margarita Mail, DO   2 weeks ago Primary hypertension   [place] [facility] [place], [place], PA-C   1 month ago Type 2 diabetes mellitus with hyperglycemia, without long-term current use of insulin [facility])   [place] [facility] Margarita Mail,  DO   2 months ago Primary hypertension   [place] [facility] Berniece Salines, FNP   3 months ago Hypervolemia, unspecified hypervolemia type   [facility] Margarita Mail, DO       Future Appointments             In 1 week Margarita Mail, DO [place] [facility], [facility]   In 2 months Margarita Mail, DO [place] [facility], [facility]             KLOR-CON M20 20 MEQ tablet 90 tablet 0    Sig: TAKE 1 TABLET BY MOUTH TWICE DAILY FOR 3 DAYS THEN  TAKE  ONE  TABLET  DAILY  WITH  LASIX     Endocrinology:  Minerals - Potassium Supplementation Passed - 08/17/2023  7:30 PM      Passed - K in normal range and within 360 days    Potassium  Date Value Ref Range Status  06/09/2023 4.6 3.5 - 5.1 mmol/L Final  03/30/2014 3.7 3.5 - 5.1 mmol/L Final         Passed - Cr in normal range and within 360 days    Creat  Date Value Ref Range Status  05/19/2023 0.73 0.50 - 1.03 mg/dL Final   Creatinine, Ser  Date Value Ref Range Status  06/09/2023 0.78 0.44 - 1.00 mg/dL Final   Creatinine, Urine  Date Value Ref Range Status  06/30/2023 118 20 - 275 mg/dL Final         Passed - Valid encounter within last 12 months    Recent Outpatient Visits           2 weeks ago Type 2 diabetes mellitus with hyperglycemia, without long-term current use of insulin [facility])   [place] [facility] Margarita Mail, DO   2 weeks ago Primary hypertension   [place] [facility] [place], [place], PA-C   1 month ago Type 2 diabetes mellitus with hyperglycemia, without long-term current use of insulin [facility])   [place] [facility] Margarita Mail, DO   2 months ago Primary hypertension    [facility] Berniece Salines, FNP   3 months ago Hypervolemia, unspecified hypervolemia type   [facility] Marg[facility]ita Mail, DO        Future Appointments             In 1 week Margarita Mail, DO [place] [facility], [facility]   In 2 months Margarita Mail, DO [place] [facility], [facility]             lisinopril (ZESTRIL) 10 MG tablet 30 tablet 0    Sig: Take 1 tablet by mouth once daily     Cardiovascular:  ACE Inhibitors Failed - 08/17/2023  7:30 PM      Failed - Last BP in normal range    BP Readings from Last 1 Encounters:  08/01/23 (!) 138/92         Passed - Cr in normal range and within 180 days    Creat  Date Value Ref Range Status  05/19/2023 0.73 0.50 - 1.03 mg/dL Final   Creatinine, Ser  Date Value Ref Range Status  06/09/2023 0.78 0.44 - 1.00 mg/dL Final   Creatinine, Urine  Date Value Ref Range Status  06/30/2023 118 20 - 275 mg/dL Final         Passed - K in normal range and within 180 days    Potassium  Date Value Ref Range Status  06/09/2023 4.6 3.5 - 5.1 mmol/L Final  03/30/2014 3.7 3.5 - 5.1 mmol/L Final         Passed - Patient is not pregnant      Passed - Valid encounter within last 6 months    Recent Outpatient Visits           2 weeks ago Type 2 diabetes mellitus with hyperglycemia, without long-term current use of insulin (HCC)   [place] [facility] Margarita Mail, DO   2 weeks ago Primary hypertension   [place] [facility] [place], [place], PA-C   1 month ago Type 2 diabetes mellitus with hyperglycemia, without long-term current use of insulin (HCC)   [place] [facility] Margarita Mail, DO   2 months ago Primary hypertension   [place] [facility] Berniece Salines, FNP   3 months ago Hypervolemia, unspecified hypervolemia type   [facility] Margarita Mail, DO       Future Appointments             In 1 week Margarita Mail, DO [place] [facility], [facility]   In 2 months Margarita Mail, DO [facility], [facility]            Refused Prescriptions Disp Refills   pioglitazone (ACTOS) 15 MG tablet [Pharmacy Med Name: Pioglitazone HCl 15 MG Oral Tablet] 90 tablet     Sig: Take 1 tablet by mouth once daily     Endocrinology:  Diabetes - Glitazones - pioglitazone Failed - 08/17/2023  7:30 PM      Failed - HBA1C is between 0 and 7.9 and within 180 days    Hemoglobin A1C  Date Value Ref Range Status  08/01/2023 8.9 (A) 4.0 - 5.6 % Final   Hgb A1c MFr Bld  Date Value Ref Range Status  04/15/2023 7.2 (H) <5.7 % of total Hgb Final    Comment:    For someone without known diabetes, a hemoglobin A1c value of 6.5% or greater indicates that they may  have  diabetes and this should be confirmed with a follow-up  test. . For someone with known diabetes, a value <7% indicates  that their diabetes is well controlled and a value  greater than or equal to 7% indicates suboptimal  control. A1c targets should be individualized based on  duration of diabetes, age, comorbid conditions, and  other considerations. . Currently, no consensus exists regarding use of hemoglobin A1c for diagnosis of diabetes for children. Verna Czech - Valid encounter within last 6 months    Recent Outpatient Visits           2 weeks ago Type 2 diabetes mellitus with hyperglycemia, without long-term current use of insulin (HCC)   [place] [facility] Mail, DO   2 weeks ago Primary hypertension   [place] [facility] [place], [place], PA-C   1 month ago Type 2 diabetes mellitus with hyperglycemia, without long-term current use of insulin [facility])   [place] [facility] Margarita Mail, DO   2 months ago Primary hypertension   [place] [facility] Berniece Salines, FNP   3 months ago Hypervolemia, unspecified hypervolemia type   [facility] Margarita Mail, DO        Future Appointments             In 1 week Margarita Mail, DO [place] [facility], [facility]   In 2 months Margarita Mail, DO  [facility], [facility]             metFORMIN (GLUCOPHAGE-XR) 500 MG 24 hr tablet [Pharmacy Med Name: metFORMIN HCl ER 500 MG Oral Tablet Extended Release 24 Hour] 180 tablet     Sig: TAKE 1 TABLET BY MOUTH TWICE DAILY WITH A MEAL     Endocrinology:  Diabetes - Biguanides Failed - 08/17/2023  7:30 PM      Failed - HBA1C is between 0 and 7.9 and within 180 days    Hemoglobin A1C  Date Value Ref Range Status  08/01/2023 8.9 (A) 4.0 - 5.6 % Final   Hgb A1c MFr Bld  Date Value Ref Range Status  04/15/2023 7.2 (H) <5.7 % of total Hgb Final    Comment:    For someone without known diabetes, a hemoglobin A1c value of 6.5% or greater indicates that they may have  diabetes and this should be confirmed with a follow-up  test. . For someone with known diabetes, a value <7% indicates  that their diabetes is well controlled and a value  greater than or equal to 7% indicates suboptimal  control. A1c targets should be individualized based on  duration of diabetes, age, comorbid conditions, and  other considerations. . Currently, no consensus exists regarding use of hemoglobin A1c for diagnosis of diabetes for children. .          Passed - Cr in normal range and within 360 days    Creat  Date Value Ref Range Status  05/19/2023 0.73 0.50 - 1.03 mg/dL Final   Creatinine, Ser  Date Value Ref Range Status  06/09/2023 0.78 0.44 - 1.00 mg/dL Final   Creatinine, Urine  Date Value Ref Range Status  06/30/2023 118 20 - 275 mg/dL Final         Passed - eGFR in normal range and within 360 days    EGFR (African American)  Date Value Ref Range Status  03/30/2014 >60  Final   GFR calc Af Amer  Date Value Ref Range Status  04/20/2020 >60 >60 mL/min Final   EGFR (Non-African Amer.)  Date Value Ref Range Status   03/30/2014 >60  Final    Comment:    eGFR values <34mL/min/1.73 m2 may be an indication of chronic kidney disease (CKD). Calculated eGFR is useful in patients with stable renal function. The eGFR calculation will not be reliable in acutely ill patients when serum creatinine is changing rapidly. It is not useful in  patients on dialysis. The eGFR calculation may not be applicable to patients at the low and high extremes of body sizes, pregnant women, and vegetarians.    GFR, Estimated  Date Value Ref Range Status  06/09/2023 >60 >60 mL/min Final    Comment:    (NOTE) Calculated using the CKD-EPI Creatinine Equation (2021)    eGFR  Date Value Ref Range Status  03/29/2022 83 > OR = 60 mL/min/1.54m2 Final    Comment:    The eGFR is based on the CKD-EPI 2021 equation. To calculate  the new eGFR from a previous Creatinine or Cystatin C result, go to https://www.kidney.org/professionals/ kdoqi/gfr%5Fcalculator          Passed - B12 Level in normal range and within 720 days    Vitamin B-12  Date Value Ref Range Status  02/20/2022 380 200 - 1,100 pg/mL Final    Comment:    . Please Note: Although the reference range for vitamin B12 is [id number] pg/mL, it has been reported that between 5 and 10% of patients with values between 200 and 400 pg/mL may experience neuropsychiatric and hematologic abnormalities due to occult B12 deficiency; less than 1% of patients with values above 400 pg/mL will have symptoms. Verna Czech - Valid encounter within last 6 months    Recent Outpatient Visits           2 weeks ago Type 2 diabetes mellitus with hyperglycemia, without long-term current use of insulin [facility])   [place] [facility] Margarita Mail, DO   2 weeks ago Primary hypertension   [place] [facility] [place], [place], PA-C   1 month ago Type 2 diabetes mellitus with hyperglycemia, without long-term current use of insulin [facility])   [facility] Margarita Mail, DO   2 months ago Primary hypertension   [place] [facility] Berniece Salines, FNP   3 months ago Hypervolemia, unspecified hypervolemia type   [facility] Margarita Mail, DO       Future Appointments             In 1 week Margarita Mail, DO [place] [facility]   In 2 months Margarita Mail, DO [place] [facility], [facility]            Passed - CBC within normal limits and completed in the last 12 months    WBC  Date Value Ref Range Status  06/09/2023 7.7 4.0 - 10.5 K/uL Final   RBC  Date Value Ref Range Status  06/09/2023 4.57 3.87 - 5.11 MIL/uL Final   Hemoglobin  Date Value Ref Range Status  06/09/2023 13.7 12.0 - 15.0 g/dL Final  16/06/9603 54.0 11.1 - 15.9 g/dL Final   HCT  Date Value Ref Range Status  06/09/2023 41.4 36.0 - 46.0 % Final   Hematocrit  Date Value Ref Range Status  06/27/2016 44.9 34.0 - 46.6 % Final   MCHC  Date Value Ref Range Status  06/09/2023 33.1 30.0 - 36.0 g/dL Final   [facility]  Date Value Ref Range Status  06/09/2023 30.0 26.0 - 34.0 pg Final   MCV  Date Value Ref Range Status  06/09/2023 90.6 80.0 - 100.0 fL Final  06/27/2016 95 79 - 97 fL Final  03/30/2014 94 80 - 100 fL Final   No results found for: "PLTCOUNTKUC", "LABPLAT", "POCPLA" RDW  Date Value Ref Range Status  06/09/2023 12.7 11.5 - 15.5 % Final  06/27/2016 13.9 12.3 - 15.4 % Final  03/30/2014 13.8 11.5 - 14.5 % Final

## 2023-08-19 NOTE — Telephone Encounter (Signed)
Requested Prescriptions  Pending Prescriptions Disp Refills   omeprazole (PRILOSEC) 40 MG capsule [Pharmacy Med Name: Omeprazole 40 MG Oral Capsule Delayed Release] 90 capsule 0    Sig: Take 1 capsule by mouth once daily     Gastroenterology: Proton Pump Inhibitors Passed - 08/17/2023  7:30 PM      Passed - Valid encounter within last 12 months    Recent Outpatient Visits           2 weeks ago Type 2 diabetes mellitus with hyperglycemia, without long-term current use of insulin (HCC)   [place] [facility] Margarita Mail, DO   2 weeks ago Primary hypertension   [place] [facility] [place], [place], PA-C   1 month ago Type 2 diabetes mellitus with hyperglycemia, without long-term current use of insulin (HCC)   [place] [facility] Margarita Mail, DO   2 months ago Primary hypertension   [place] [facility] Berniece Salines, FNP   3 months ago Hypervolemia, unspecified hypervolemia type   [facility] Margarita Mail, DO       Future Appointments             In 1 week Margarita Mail, DO [place] [facility]   In 2 months Margarita Mail, DO [place] [facility], [facility]             pioglitazone (ACTOS) 15 MG tablet [Pharmacy Med Name: Pioglitazone HCl 15 MG Oral Tablet] 90 tablet     Sig: Take 1 tablet by mouth once daily     Endocrinology:  Diabetes - Glitazones - pioglitazone Failed - 08/17/2023  7:30 PM      Failed - HBA1C is between 0 and 7.9 and within 180 days    Hemoglobin A1C  Date Value Ref Range Status  08/01/2023 8.9 (A) 4.0 - 5.6 % Final   Hgb A1c MFr Bld  Date Value Ref Range Status  04/15/2023 7.2 (H) <5.7 % of total Hgb Final    Comment:    For someone without known diabetes, a hemoglobin A1c value of 6.5% or greater indicates that they may have  diabetes and this should be confirmed with a follow-up   test. . For someone with known diabetes, a value <7% indicates  that their diabetes is well controlled and a value  greater than or equal to 7% indicates suboptimal  control. A1c targets should be individualized based on  duration of diabetes, age, comorbid conditions, and  other considerations. . Currently, no consensus exists regarding use of hemoglobin A1c for diagnosis of diabetes for children. Verna Czech - Valid encounter within last 6 months    Recent Outpatient Visits           2 weeks ago Type 2 diabetes mellitus with hyperglycemia, without long-term current use of insulin [facility])   [place] [facility] Margarita Mail, DO   2 weeks ago Primary hypertension   [place] [facility] [place], [place], PA-C   1 month ago Type 2 diabetes mellitus with hyperglycemia, without long-term current use of insulin [facility])   [facility] Margarita Mail, DO   2 months ago Primary hypertension   [facility] Berniece Salines, FNP   3 months ago Hypervolemia, unspecified hypervolemia type   [facility] Margarita Mail, [place]  Future Appointments             In 1 week Margarita Mail, DO [place] [facility]   In 2 months Margarita Mail, DO [place] [facility], [facility]             furosemide (LASIX) 40 MG tablet [Pharmacy Med Name: Furosemide 40 MG Oral Tablet] 30 tablet     Sig: TAKE 1 TABLET BY MOUTH TWICE DAILY AS NEEDED FOR  EDEMA  IN  THE  MORNING.  CAN  TAKE  AN  ADDITIONAL  TABLET  IN  THE  AFTERNOON     Cardiovascular:  Diuretics - Loop Failed - 08/17/2023  7:30 PM      Failed - Mg Level in normal range and within 180 days    Magnesium  Date Value Ref Range Status  11/20/2018 2.1 1.7 - 2.4 mg/dL Final    Comment:    Performed at [facility], [address] [place], [place] [address]          Failed - Last BP in normal range    BP Readings from Last 1 Encounters:  08/01/23 (!) 138/92         Passed - K in normal range and within 180 days    Potassium  Date Value Ref Range Status  06/09/2023 4.6 3.5 - 5.1 mmol/L Final  03/30/2014 3.7 3.5 - 5.1 mmol/L Final         Passed - Ca in normal range and within 180 days    Calcium  Date Value Ref Range Status  06/09/2023 9.7 8.9 - 10.3 mg/dL Final   Calcium, Total  Date Value Ref Range Status  03/30/2014 9.4 8.5 - 10.1 mg/dL Final   Calcium, Ion  Date Value Ref Range Status  03/07/2018 0.91 (L) 1.15 - 1.40 mmol/L Final         Passed - Na in normal range and within 180 days    Sodium  Date Value Ref Range Status  06/09/2023 137 135 - 145 mmol/L Final  06/27/2016 147 (H) 134 - 144 mmol/L Final  03/30/2014 135 (L) 136 - 145 mmol/L Final         Passed - Cr in normal range and within 180 days    Creat  Date Value Ref Range Status  05/19/2023 0.73 0.50 - 1.03 mg/dL Final   Creatinine, Ser  Date Value Ref Range Status  06/09/2023 0.78 0.44 - 1.00 mg/dL Final   Creatinine, Urine  Date Value Ref Range Status  06/30/2023 118 20 - 275 mg/dL Final         Passed - Cl in normal range and within 180 days    Chloride  Date Value Ref Range Status  06/09/2023 100 98 - 111 mmol/L Final  03/30/2014 105 98 - 107 mmol/L Final         Passed - Valid encounter within last 6 months    Recent Outpatient Visits           2 weeks ago Type 2 diabetes mellitus with hyperglycemia, without long-term current use of insulin [facility])   [place] [facility] Margarita Mail, DO   2 weeks ago Primary hypertension   [place] [facility] [place], [place], PA-C   1 month ago Type 2 diabetes mellitus with hyperglycemia, without long-term current use of insulin [facility])   [place] [address] [facility] Margarita Mail, DO   2 months ago Primary hypertension  [facility] Della Goo F, FNP   3 months ago Hypervolemia, unspecified hypervolemia type   [facility] Margarita Mail, DO       Future Appointments             In 1 week Margarita Mail, DO [place] [facility]   In 2 months Margarita Mail, DO [place] [facility], [facility]             KLOR-CON M20 20 MEQ tablet [Pharmacy Med Name: Klor-Con M20 20 MEQ Oral Tablet Extended Release] 90 tablet 0    Sig: TAKE 1 TABLET BY MOUTH TWICE DAILY FOR 3 DAYS THEN  TAKE  ONE  TABLET  DAILY  WITH  LASIX     Endocrinology:  Minerals - Potassium Supplementation Passed - 08/17/2023  7:30 PM      Passed - K in normal range and within 360 days    Potassium  Date Value Ref Range Status  06/09/2023 4.6 3.5 - 5.1 mmol/L Final  03/30/2014 3.7 3.5 - 5.1 mmol/L Final         Passed - Cr in normal range and within 360 days    Creat  Date Value Ref Range Status  05/19/2023 0.73 0.50 - 1.03 mg/dL Final   Creatinine, Ser  Date Value Ref Range Status  06/09/2023 0.78 0.44 - 1.00 mg/dL Final   Creatinine, Urine  Date Value Ref Range Status  06/30/2023 118 20 - 275 mg/dL Final         Passed - Valid encounter within last 12 months    Recent Outpatient Visits           2 weeks ago Type 2 diabetes mellitus with hyperglycemia, without long-term current use of insulin (HCC)   [place] [facility] Margarita Mail, DO   2 weeks ago Primary hypertension   [place] [facility] [place], [place], PA-C   1 month ago Type 2 diabetes mellitus with hyperglycemia, without long-term current use of insulin [facility])   [place] [facility], DO   2 months ago Primary hypertension   [place] [facility] Della Goo F, FNP   3 months ago Hypervolemia, unspecified hypervolemia type   [facility] Margarita Mail, DO        Future Appointments             In 1 week Margarita Mail, DO [place] [facility]   In 2 months Margarita Mail, DO [place] [facility], [facility]             lisinopril (ZESTRIL) 10 MG tablet [Pharmacy Med Name: Lisinopril 10 MG Oral Tablet] 30 tablet     Sig: Take 1 tablet by mouth once daily     Cardiovascular:  ACE Inhibitors Failed - 08/17/2023  7:30 PM      Failed - Last BP in normal range    BP Readings from Last 1 Encounters:  08/01/23 (!) 138/92         Passed - Cr in normal range and within 180 days    Creat  Date Value Ref Range Status  05/19/2023 0.73 0.50 - 1.03 mg/dL Final   Creatinine, Ser  Date Value Ref Range Status  06/09/2023 0.78 0.44 - 1.00 mg/dL Final   Creatinine, Urine  Date Value Ref Range Status  06/30/2023 118 20 - 275 mg/dL Final  Passed - K in normal range and within 180 days    Potassium  Date Value Ref Range Status  06/09/2023 4.6 3.5 - 5.1 mmol/L Final  03/30/2014 3.7 3.5 - 5.1 mmol/L Final         Passed - Patient is not pregnant      Passed - Valid encounter within last 6 months    Recent Outpatient Visits           2 weeks ago Type 2 diabetes mellitus with hyperglycemia, without long-term current use of insulin (HCC)   [facility], DO   2 weeks ago Primary hypertension   [place] [facility] [place], [place], PA-C   1 month ago Type 2 diabetes mellitus with hyperglycemia, without long-term current use of insulin (HCC)   [place] [facility] Margarita Mail, DO   2 months ago Primary hypertension   [place] [facility] Berniece Salines, FNP   3 months ago Hypervolemia, unspecified hypervolemia type   [facility] Margarita Mail, DO       Future Appointments             In 1 week Margarita Mail, DO [place] [facility], [facility]   In 2 months Margarita Mail, DO  [place] [facility], [facility]             metFORMIN (GLUCOPHAGE-XR) 500 MG 24 hr tablet [Pharmacy Med Name: metFORMIN HCl ER 500 MG Oral Tablet Extended Release 24 Hour] 180 tablet     Sig: TAKE 1 TABLET BY MOUTH TWICE DAILY WITH A MEAL     Endocrinology:  Diabetes - Biguanides Failed - 08/17/2023  7:30 PM      Failed - HBA1C is between 0 and 7.9 and within 180 days    Hemoglobin A1C  Date Value Ref Range Status  08/01/2023 8.9 (A) 4.0 - 5.6 % Final   Hgb A1c MFr Bld  Date Value Ref Range Status  04/15/2023 7.2 (H) <5.7 % of total Hgb Final    Comment:    For someone without known diabetes, a hemoglobin A1c value of 6.5% or greater indicates that they may have  diabetes and this should be confirmed with a follow-up  test. . For someone with known diabetes, a value <7% indicates  that their diabetes is well controlled and a value  greater than or equal to 7% indicates suboptimal  control. A1c targets should be individualized based on  duration of diabetes, age, comorbid conditions, and  other considerations. . Currently, no consensus exists regarding use of hemoglobin A1c for diagnosis of diabetes for children. .          Passed - Cr in normal range and within 360 days    Creat  Date Value Ref Range Status  05/19/2023 0.73 0.50 - 1.03 mg/dL Final   Creatinine, Ser  Date Value Ref Range Status  06/09/2023 0.78 0.44 - 1.00 mg/dL Final   Creatinine, Urine  Date Value Ref Range Status  06/30/2023 118 20 - 275 mg/dL Final         Passed - eGFR in normal range and within 360 days    EGFR (African American)  Date Value Ref Range Status  03/30/2014 >60  Final   GFR calc Af Amer  Date Value Ref Range Status  04/20/2020 >60 >60 mL/min Final   EGFR (Non-African Amer.)  Date Value Ref Range Status  03/30/2014 >  60  Final    Comment:    eGFR values <29mL/min/1.73 m2 may be an indication of  chronic kidney disease (CKD). Calculated eGFR is useful in patients with stable renal function. The eGFR calculation will not be reliable in acutely ill patients when serum creatinine is changing rapidly. It is not useful in  patients on dialysis. The eGFR calculation may not be applicable to patients at the low and high extremes of body sizes, pregnant women, and vegetarians.    GFR, Estimated  Date Value Ref Range Status  06/09/2023 >60 >60 mL/min Final    Comment:    (NOTE) Calculated using the CKD-EPI Creatinine Equation (2021)    eGFR  Date Value Ref Range Status  03/29/2022 83 > OR = 60 mL/min/1.52m2 Final    Comment:    The eGFR is based on the CKD-EPI 2021 equation. To calculate  the new eGFR from a previous Creatinine or Cystatin C result, go to https://www.kidney.org/professionals/ kdoqi/gfr%5Fcalculator          Passed - B12 Level in normal range and within 720 days    Vitamin B-12  Date Value Ref Range Status  02/20/2022 380 200 - 1,100 pg/mL Final    Comment:    . Please Note: Although the reference range for vitamin B12 is [id number] pg/mL, it has been reported that between 5 and 10% of patients with values between 200 and 400 pg/mL may experience neuropsychiatric and hematologic abnormalities due to occult B12 deficiency; less than 1% of patients with values above 400 pg/mL will have symptoms. Verna Czech - Valid encounter within last 6 months    Recent Outpatient Visits           2 weeks ago Type 2 diabetes mellitus with hyperglycemia, without long-term current use of insulin [facility])   [place] [facility] Mail, DO   2 weeks ago Primary hypertension   [place] [facility] [place], [place], PA-C   1 month ago Type 2 diabetes mellitus with hyperglycemia, without long-term current use of insulin [facility])   [place] [facility] Margarita Mail, DO   2 months ago Primary hypertension    [place] [facility] Berniece Salines, FNP   3 months ago Hypervolemia, unspecified hypervolemia type   [facility] Margarita Mail, DO       Future Appointments             In 1 week Margarita Mail, DO [place] [facility]   In 2 months Margarita Mail, DO [place] [facility], [facility]            Passed - CBC within normal limits and completed in the last 12 months    WBC  Date Value Ref Range Status  06/09/2023 7.7 4.0 - 10.5 K/uL Final   RBC  Date Value Ref Range Status  06/09/2023 4.57 3.87 - 5.11 MIL/uL Final   Hemoglobin  Date Value Ref Range Status  06/09/2023 13.7 12.0 - 15.0 g/dL Final  82/95/6213 08.6 11.1 - 15.9 g/dL Final   HCT  Date Value Ref Range Status  06/09/2023 41.4 36.0 - 46.0 % Final   Hematocrit  Date Value Ref Range Status  06/27/2016 44.9 34.0 - 46.6 % Final   MCHC  Date Value Ref Range Status  06/09/2023 33.1 30.0 - 36.0 g/dL Final   [facility]  Date Value Ref Range  Status  06/09/2023 30.0 26.0 - 34.0 pg Final   MCV  Date Value Ref Range Status  06/09/2023 90.6 80.0 - 100.0 fL Final  06/27/2016 95 79 - 97 fL Final  03/30/2014 94 80 - 100 fL Final   No results found for: "PLTCOUNTKUC", "LABPLAT", "POCPLA" RDW  Date Value Ref Range Status  06/09/2023 12.7 11.5 - 15.5 % Final  06/27/2016 13.9 12.3 - 15.4 % Final  03/30/2014 13.8 11.5 - 14.5 % Final

## 2023-08-24 DIAGNOSIS — Z8673 Personal history of transient ischemic attack (TIA), and cerebral infarction without residual deficits: Secondary | ICD-10-CM | POA: Diagnosis not present

## 2023-08-24 DIAGNOSIS — Z809 Family history of malignant neoplasm, unspecified: Secondary | ICD-10-CM | POA: Diagnosis not present

## 2023-08-24 DIAGNOSIS — R569 Unspecified convulsions: Secondary | ICD-10-CM | POA: Diagnosis not present

## 2023-08-24 DIAGNOSIS — Z87891 Personal history of nicotine dependence: Secondary | ICD-10-CM | POA: Diagnosis not present

## 2023-08-24 DIAGNOSIS — Z7984 Long term (current) use of oral hypoglycemic drugs: Secondary | ICD-10-CM | POA: Diagnosis not present

## 2023-08-24 DIAGNOSIS — K219 Gastro-esophageal reflux disease without esophagitis: Secondary | ICD-10-CM | POA: Diagnosis not present

## 2023-08-24 DIAGNOSIS — F319 Bipolar disorder, unspecified: Secondary | ICD-10-CM | POA: Diagnosis not present

## 2023-08-24 DIAGNOSIS — E785 Hyperlipidemia, unspecified: Secondary | ICD-10-CM | POA: Diagnosis not present

## 2023-08-24 DIAGNOSIS — Z8249 Family history of ischemic heart disease and other diseases of the circulatory system: Secondary | ICD-10-CM | POA: Diagnosis not present

## 2023-08-24 DIAGNOSIS — I10 Essential (primary) hypertension: Secondary | ICD-10-CM | POA: Diagnosis not present

## 2023-08-24 DIAGNOSIS — Z833 Family history of diabetes mellitus: Secondary | ICD-10-CM | POA: Diagnosis not present

## 2023-08-24 DIAGNOSIS — E119 Type 2 diabetes mellitus without complications: Secondary | ICD-10-CM | POA: Diagnosis not present

## 2023-08-27 ENCOUNTER — Ambulatory Visit: Payer: Self-pay | Admitting: *Deleted

## 2023-08-27 ENCOUNTER — Other Ambulatory Visit: Payer: Self-pay | Admitting: Internal Medicine

## 2023-08-27 ENCOUNTER — Ambulatory Visit: Payer: Medicaid Other

## 2023-08-27 DIAGNOSIS — M7989 Other specified soft tissue disorders: Secondary | ICD-10-CM

## 2023-08-27 DIAGNOSIS — E1165 Type 2 diabetes mellitus with hyperglycemia: Secondary | ICD-10-CM

## 2023-08-27 NOTE — Telephone Encounter (Signed)
Summary: Pt requests to speak with a nurse regarding 3 medications   Pt requests to speak with a nurse regarding 3 medications but she does not know the names of the medications. Cb# [phone]         Chief Complaint: Medication Refills Symptoms: NA Frequency: NA Pertinent Negatives: Patient denies NA Disposition: [] ED /[] Urgent Care (no appt availability in office) / [] Appointment(In office/virtual)/ []  [place] [facility]/ [] Home Care/ [] Refused Recommended Disposition /[] [place] [facility]/ [x]  Follow-up with PCP Additional Notes:  Pt states pharmacy faxed over 3 medications that need prior authorization.  Does not recall names.  Please advise.  Reason for Disposition  [1] Caller has URGENT medicine question about med that PCP or specialist prescribed AND [2] triager unable to answer question  Answer Assessment - Initial Assessment Questions 1. NAME of MEDICINE: "What medicine(s) are you calling about?"     Unsure 2. QUESTION: "What is your question?" (e.g., double dose of medicine, side effect)     Needs PA  Protocols used: Medication Question Call-A-AH

## 2023-08-28 NOTE — Telephone Encounter (Signed)
Requested Prescriptions  Pending Prescriptions Disp Refills   pioglitazone (ACTOS) 15 MG tablet [Pharmacy Med Name: Pioglitazone HCl 15 MG Oral Tablet] 90 tablet 0    Sig: Take 1 tablet by mouth once daily     Endocrinology:  Diabetes - Glitazones - pioglitazone Failed - 08/28/2023  7:39 AM      Failed - HBA1C is between 0 and 7.9 and within 180 days    Hemoglobin A1C  Date Value Ref Range Status  08/01/2023 8.9 (A) 4.0 - 5.6 % Final   Hgb A1c MFr Bld  Date Value Ref Range Status  04/15/2023 7.2 (H) <5.7 % of total Hgb Final    Comment:    For someone without known diabetes, a hemoglobin A1c value of 6.5% or greater indicates that they may have  diabetes and this should be confirmed with a follow-up  test. . For someone with known diabetes, a value <7% indicates  that their diabetes is well controlled and a value  greater than or equal to 7% indicates suboptimal  control. A1c targets should be individualized based on  duration of diabetes, age, comorbid conditions, and  other considerations. . Currently, no consensus exists regarding use of hemoglobin A1c for diagnosis of diabetes for children. Verna Czech - Valid encounter within last 6 months    Recent Outpatient Visits           3 weeks ago Type 2 diabetes mellitus with hyperglycemia, without long-term current use of insulin (HCC)   [place] [facility] Margarita Mail, DO   4 weeks ago Primary hypertension   [place] [facility] [place], [place], PA-C   1 month ago Type 2 diabetes mellitus with hyperglycemia, without long-term current use of insulin [facility])   [place] [facility] Marg[facility]ita Mail, DO   2 months ago Primary hypertension   [place] [facility] Berniece Salines, FNP   3 months ago Hypervolemia, unspecified hypervolemia type   [facility]ita Mail, DO       Future Appointments              Tomorrow Margarita Mail, DO [place] [facility], [facility]   In 1 month Margarita Mail, DO [facility], [facility]            Signed Prescriptions Disp Refills   metFORMIN (GLUCOPHAGE-XR) 500 MG 24 hr tablet 180 tablet 0    Sig: TAKE 1 TABLET BY MOUTH TWICE DAILY WITH A MEAL     Endocrinology:  Diabetes - Biguanides Failed - 08/28/2023  7:39 AM      Failed - HBA1C is between 0 and 7.9 and within 180 days    Hemoglobin A1C  Date Value Ref Range Status  08/01/2023 8.9 (A) 4.0 - 5.6 % Final   Hgb A1c MFr Bld  Date Value Ref Range Status  04/15/2023 7.2 (H) <5.7 % of total Hgb Final    Comment:    For someone without known diabetes, a hemoglobin A1c value of 6.5% or greater indicates that they may have  diabetes and this should be confirmed with a follow-up  test. . For someone with known diabetes, a value <7% indicates  that their diabetes is well controlled and a value  greater than or equal to 7% indicates suboptimal  control. A1c targets should be individualized based on  duration of diabetes, age, comorbid conditions, and  other considerations. . Currently, no consensus exists regarding use of hemoglobin A1c for diagnosis of diabetes for children. .          Passed - Cr in normal range and within 360 days    Creat  Date Value Ref Range Status  05/19/2023 0.73 0.50 - 1.03 mg/dL Final   Creatinine, Ser  Date Value Ref Range Status  06/09/2023 0.78 0.44 - 1.00 mg/dL Final   Creatinine, Urine  Date Value Ref Range Status  06/30/2023 118 20 - 275 mg/dL Final         Passed - eGFR in normal range and within 360 days    EGFR (African American)  Date Value Ref Range Status  03/30/2014 >60  Final   GFR calc Af Amer  Date Value Ref Range Status  04/20/2020 >60 >60 mL/min Final   EGFR (Non-African Amer.)  Date Value Ref Range Status  03/30/2014 >60  Final    Comment:    eGFR values <43mL/min/1.73 m2 may be an  indication of chronic kidney disease (CKD). Calculated eGFR is useful in patients with stable renal function. The eGFR calculation will not be reliable in acutely ill patients when serum creatinine is changing rapidly. It is not useful in  patients on dialysis. The eGFR calculation may not be applicable to patients at the low and high extremes of body sizes, pregnant women, and vegetarians.    GFR, Estimated  Date Value Ref Range Status  06/09/2023 >60 >60 mL/min Final    Comment:    (NOTE) Calculated using the CKD-EPI Creatinine Equation (2021)    eGFR  Date Value Ref Range Status  03/29/2022 83 > OR = 60 mL/min/1.64m2 Final    Comment:    The eGFR is based on the CKD-EPI 2021 equation. To calculate  the new eGFR from a previous Creatinine or Cystatin C result, go to https://www.kidney.org/professionals/ kdoqi/gfr%5Fcalculator          Passed - B12 Level in normal range and within 720 days    Vitamin B-12  Date Value Ref Range Status  02/20/2022 380 200 - 1,100 pg/mL Final    Comment:    . Please Note: Although the reference range for vitamin B12 is [phone] pg/mL, it has been reported that between 5 and 10% of patients with values between 200 and 400 pg/mL may experience neuropsychiatric and hematologic abnormalities due to occult B12 deficiency; less than 1% of patients with values above 400 pg/mL will have symptoms. Verna Czech - Valid encounter within last 6 months    Recent Outpatient Visits           3 weeks ago Type 2 diabetes mellitus with hyperglycemia, without long-term current use of insulin [facility])   [place] [facility] Marg[facility] Mail, DO   4 weeks ago Primary hypertension   [place] [facility] [place], [place], PA-C   1 month ago Type 2 diabetes mellitus with hyperglycemia, without long-term current use of insulin [facility])   [place] [facility] Margarita Mail, DO   2 months ago Primary  hypertension   [facility] Berniece Salines, FNP   3 months ago Hypervolemia, unspecified hypervolemia type   [facility] Mail, DO       Future Appointments             Tomorrow Margarita Mail, DO [place] [facility]  In 1 month Margarita Mail, DO  [facility], [facility]            Passed - CBC within normal limits and completed in the last 12 months    WBC  Date Value Ref Range Status  06/09/2023 7.7 4.0 - 10.5 K/uL Final   RBC  Date Value Ref Range Status  06/09/2023 4.57 3.87 - 5.11 MIL/uL Final   Hemoglobin  Date Value Ref Range Status  06/09/2023 13.7 12.0 - 15.0 g/dL Final  91/47/8295 62.1 11.1 - 15.9 g/dL Final   HCT  Date Value Ref Range Status  06/09/2023 41.4 36.0 - 46.0 % Final   Hematocrit  Date Value Ref Range Status  06/27/2016 44.9 34.0 - 46.6 % Final   MCHC  Date Value Ref Range Status  06/09/2023 33.1 30.0 - 36.0 g/dL Final   [facility]  Date Value Ref Range Status  06/09/2023 30.0 26.0 - 34.0 pg Final   MCV  Date Value Ref Range Status  06/09/2023 90.6 80.0 - 100.0 fL Final  06/27/2016 95 79 - 97 fL Final  03/30/2014 94 80 - 100 fL Final   No results found for: "PLTCOUNTKUC", "LABPLAT", "POCPLA" RDW  Date Value Ref Range Status  06/09/2023 12.7 11.5 - 15.5 % Final  06/27/2016 13.9 12.3 - 15.4 % Final  03/30/2014 13.8 11.5 - 14.5 % Final          furosemide (LASIX) 40 MG tablet 30 tablet 0    Sig: TAKE 1 TABLET BY MOUTH TWICE DAILY AS NEEDED FOR EDEMA IN THE MORNING. CAN TAKE ADDITIONAL TABLET IN THE AFTERNOON.     Cardiovascular:  Diuretics - Loop Failed - 08/28/2023  7:39 AM      Failed - Mg Level in normal range and within 180 days    Magnesium  Date Value Ref Range Status  11/20/2018 2.1 1.7 - 2.4 mg/dL Final    Comment:    Performed at [facility], [address] [place], [place] [address]          Failed - Last BP in normal range    BP Readings from Last 1 Encounters:  08/01/23 (!) 138/92         Passed - K in normal range and within 180 days    Potassium  Date Value Ref Range Status  06/09/2023 4.6 3.5 - 5.1 mmol/L Final  03/30/2014 3.7 3.5 - 5.1 mmol/L Final         Passed - Ca in normal range and within 180 days    Calcium  Date Value Ref Range Status  06/09/2023 9.7 8.9 - 10.3 mg/dL Final   Calcium, Total  Date Value Ref Range Status  03/30/2014 9.4 8.5 - 10.1 mg/dL Final   Calcium, Ion  Date Value Ref Range Status  03/07/2018 0.91 (L) 1.15 - 1.40 mmol/L Final         Passed - Na in normal range and within 180 days    Sodium  Date Value Ref Range Status  06/09/2023 137 135 - 145 mmol/L Final  06/27/2016 147 (H) 134 - 144 mmol/L Final  03/30/2014 135 (L) 136 - 145 mmol/L Final         Passed - Cr in normal range and within 180 days    Creat  Date Value Ref Range Status  05/19/2023 0.73 0.50 - 1.03 mg/dL Final   Creatinine, Ser  Date Value Ref Range Status  06/09/2023 0.78 0.44 - 1.00 mg/dL  Final   Creatinine, Urine  Date Value Ref Range Status  06/30/2023 118 20 - 275 mg/dL Final         Passed - Cl in normal range and within 180 days    Chloride  Date Value Ref Range Status  06/09/2023 100 98 - 111 mmol/L Final  03/30/2014 105 98 - 107 mmol/L Final         Passed - Valid encounter within last 6 months    Recent Outpatient Visits           3 weeks ago Type 2 diabetes mellitus with hyperglycemia, without long-term current use of insulin [facility])   [place] [facility] Marg[facility]ita Mail, DO   4 weeks ago Primary hypertension   [place] [facility] [place], [place], PA-C   1 month ago Type 2 diabetes mellitus with hyperglycemia, without long-term current use of insulin [facility])   [place] [facility] Margarita Mail, DO   2 months ago Primary hypertension   [facility]  Berniece Salines, FNP   3 months ago Hypervolemia, unspecified hypervolemia type   [facility] Margarita Mail, DO       Future Appointments             Tomorrow Margarita Mail, DO [facility] [facility], [facility]   In 1 month Margarita Mail, DO [facility], [facility]

## 2023-08-28 NOTE — Telephone Encounter (Signed)
Patient call about refills. Notified they have been sent

## 2023-08-28 NOTE — Telephone Encounter (Signed)
Requested Prescriptions  Pending Prescriptions Disp Refills   metFORMIN (GLUCOPHAGE-XR) 500 MG 24 hr tablet [Pharmacy Med Name: metFORMIN HCl ER 500 MG Oral Tablet Extended Release 24 Hour] 180 tablet 0    Sig: TAKE 1 TABLET BY MOUTH TWICE DAILY WITH A MEAL     Endocrinology:  Diabetes - Biguanides Failed - 08/28/2023  7:37 AM      Failed - HBA1C is between 0 and 7.9 and within 180 days    Hemoglobin A1C  Date Value Ref Range Status  08/01/2023 8.9 (A) 4.0 - 5.6 % Final   Hgb A1c MFr Bld  Date Value Ref Range Status  04/15/2023 7.2 (H) <5.7 % of total Hgb Final    Comment:    For someone without known diabetes, a hemoglobin A1c value of 6.5% or greater indicates that they may have  diabetes and this should be confirmed with a follow-up  test. . For someone with known diabetes, a value <7% indicates  that their diabetes is well controlled and a value  greater than or equal to 7% indicates suboptimal  control. A1c targets should be individualized based on  duration of diabetes, age, comorbid conditions, and  other considerations. . Currently, no consensus exists regarding use of hemoglobin A1c for diagnosis of diabetes for children. .          Passed - Cr in normal range and within 360 days    Creat  Date Value Ref Range Status  05/19/2023 0.73 0.50 - 1.03 mg/dL Final   Creatinine, Ser  Date Value Ref Range Status  06/09/2023 0.78 0.44 - 1.00 mg/dL Final   Creatinine, Urine  Date Value Ref Range Status  06/30/2023 118 20 - 275 mg/dL Final         Passed - eGFR in normal range and within 360 days    EGFR (African American)  Date Value Ref Range Status  03/30/2014 >60  Final   GFR calc Af Amer  Date Value Ref Range Status  04/20/2020 >60 >60 mL/min Final   EGFR (Non-African Amer.)  Date Value Ref Range Status  03/30/2014 >60  Final    Comment:    eGFR values <56mL/min/1.73 m2 may be an indication of chronic kidney disease (CKD). Calculated eGFR is useful in  patients with stable renal function. The eGFR calculation will not be reliable in acutely ill patients when serum creatinine is changing rapidly. It is not useful in  patients on dialysis. The eGFR calculation may not be applicable to patients at the low and high extremes of body sizes, pregnant women, and vegetarians.    GFR, Estimated  Date Value Ref Range Status  06/09/2023 >60 >60 mL/min Final    Comment:    (NOTE) Calculated using the CKD-EPI Creatinine Equation (2021)    eGFR  Date Value Ref Range Status  03/29/2022 83 > OR = 60 mL/min/1.73m2 Final    Comment:    The eGFR is based on the CKD-EPI 2021 equation. To calculate  the new eGFR from a previous Creatinine or Cystatin C result, go to https://www.kidney.org/professionals/ kdoqi/gfr%5Fcalculator          Passed - B12 Level in normal range and within 720 days    Vitamin B-12  Date Value Ref Range Status  02/20/2022 380 200 - 1,100 pg/mL Final    Comment:    . Please Note: Although the reference range for vitamin B12 is [phone] pg/mL, it has been reported that between 5 and 10% of patients  with values between 200 and 400 pg/mL may experience neuropsychiatric and hematologic abnormalities due to occult B12 deficiency; less than 1% of patients with values above 400 pg/mL will have symptoms. Verna Czech - Valid encounter within last 6 months    Recent Outpatient Visits           3 weeks ago Type 2 diabetes mellitus with hyperglycemia, without long-term current use of insulin (HCC)   [place] [facility] Margarita Mail, DO   4 weeks ago Primary hypertension   [place] [facility] [place], [place], PA-C   1 month ago Type 2 diabetes mellitus with hyperglycemia, without long-term current use of insulin (HCC)   [place] [facility], DO   2 months ago Primary hypertension   [place] [facility] Berniece Salines,  FNP   3 months ago Hypervolemia, unspecified hypervolemia type   [place] [facility] Margarita Mail, DO       Future Appointments             Tomorrow Margarita Mail, DO [facility], [facility]   In 1 month Margarita Mail, DO [place] [facility], [facility]            Passed - CBC within normal limits and completed in the last 12 months    WBC  Date Value Ref Range Status  06/09/2023 7.7 4.0 - 10.5 K/uL Final   RBC  Date Value Ref Range Status  06/09/2023 4.57 3.87 - 5.11 MIL/uL Final   Hemoglobin  Date Value Ref Range Status  06/09/2023 13.7 12.0 - 15.0 g/dL Final  36/64/4034 74.2 11.1 - 15.9 g/dL Final   HCT  Date Value Ref Range Status  06/09/2023 41.4 36.0 - 46.0 % Final   Hematocrit  Date Value Ref Range Status  06/27/2016 44.9 34.0 - 46.6 % Final   MCHC  Date Value Ref Range Status  06/09/2023 33.1 30.0 - 36.0 g/dL Final   [facility]  Date Value Ref Range Status  06/09/2023 30.0 26.0 - 34.0 pg Final   MCV  Date Value Ref Range Status  06/09/2023 90.6 80.0 - 100.0 fL Final  06/27/2016 95 79 - 97 fL Final  03/30/2014 94 80 - 100 fL Final   No results found for: "PLTCOUNTKUC", "LABPLAT", "POCPLA" RDW  Date Value Ref Range Status  06/09/2023 12.7 11.5 - 15.5 % Final  06/27/2016 13.9 12.3 - 15.4 % Final  03/30/2014 13.8 11.5 - 14.5 % Final          pioglitazone (ACTOS) 15 MG tablet [Pharmacy Med Name: Pioglitazone HCl 15 MG Oral Tablet] 90 tablet 0    Sig: Take 1 tablet by mouth once daily     Endocrinology:  Diabetes - Glitazones - pioglitazone Failed - 08/28/2023  7:37 AM      Failed - HBA1C is between 0 and 7.9 and within 180 days    Hemoglobin A1C  Date Value Ref Range Status  08/01/2023 8.9 (A) 4.0 - 5.6 % Final   Hgb A1c MFr Bld  Date Value Ref Range Status  04/15/2023 7.2 (H) <5.7 % of total Hgb Final    Comment:    For someone without known diabetes, a hemoglobin A1c value of  6.5% or greater indicates that they may have  diabetes and this should be confirmed with a follow-up  test. . For someone with known diabetes,  a value <7% indicates  that their diabetes is well controlled and a value  greater than or equal to 7% indicates suboptimal  control. A1c targets should be individualized based on  duration of diabetes, age, comorbid conditions, and  other considerations. . Currently, no consensus exists regarding use of hemoglobin A1c for diagnosis of diabetes for children. Verna Czech - Valid encounter within last 6 months    Recent Outpatient Visits           3 weeks ago Type 2 diabetes mellitus with hyperglycemia, without long-term current use of insulin [facility])   [place] [facility] Marg[facility] Mail, DO   4 weeks ago Primary hypertension   [place] [facility] [place], [place] [employer], PA-C   1 month ago Type 2 diabetes mellitus with hyperglycemia, without long-term current use of insulin [facility])   [place] [facility] Mail, DO   2 months ago Primary hypertension   [place] [name] Berniece Salines, FNP   3 months ago Hypervolemia, unspecified hypervolemia type   [facility] [facility] Margarita Mail, DO       Future Appointments             Tomorrow Margarita Mail, DO  [facility], [facility]   In 1 month Margarita Mail, DO [facility], [facility]             furosemide (LASIX) 40 MG tablet [Pharmacy Med Name: Furosemide 40 MG Oral Tablet] 30 tablet 0    Sig: TAKE 1 TABLET BY MOUTH TWICE DAILY AS NEEDED FOR EDEMA IN THE MORNING. CAN TAKE ADDITIONAL TABLET IN THE AFTERNOON.     Cardiovascular:  Diuretics - Loop Failed - 08/28/2023  7:37 AM      Failed - Mg Level in normal range and within 180 days    Magnesium  Date Value Ref Range Status  11/20/2018 2.1 1.7 - 2.4 mg/dL Final     Comment:    Performed at [facility], [address] [place], [place] [address]         Failed - Last BP in normal range    BP Readings from Last 1 Encounters:  08/01/23 (!) 138/92         Passed - K in normal range and within 180 days    Potassium  Date Value Ref Range Status  06/09/2023 4.6 3.5 - 5.1 mmol/L Final  03/30/2014 3.7 3.5 - 5.1 mmol/L Final         Passed - Ca in normal range and within 180 days    Calcium  Date Value Ref Range Status  06/09/2023 9.7 8.9 - 10.3 mg/dL Final   Calcium, Total  Date Value Ref Range Status  03/30/2014 9.4 8.5 - 10.1 mg/dL Final   Calcium, Ion  Date Value Ref Range Status  03/07/2018 0.91 (L) 1.15 - 1.40 mmol/L Final         Passed - Na in normal range and within 180 days    Sodium  Date Value Ref Range Status  06/09/2023 137 135 - 145 mmol/L Final  06/27/2016 147 (H) 134 - 144 mmol/L Final  03/30/2014 135 (L) 136 - 145 mmol/L Final         Passed - Cr in normal range and within 180 days    Creat  Date Value Ref Range Status  05/19/2023 0.73 0.50 - 1.03  mg/dL Final   Creatinine, Ser  Date Value Ref Range Status  06/09/2023 0.78 0.44 - 1.00 mg/dL Final   Creatinine, Urine  Date Value Ref Range Status  06/30/2023 118 20 - 275 mg/dL Final         Passed - Cl in normal range and within 180 days    Chloride  Date Value Ref Range Status  06/09/2023 100 98 - 111 mmol/L Final  03/30/2014 105 98 - 107 mmol/L Final         Passed - Valid encounter within last 6 months    Recent Outpatient Visits           3 weeks ago Type 2 diabetes mellitus with hyperglycemia, without long-term current use of insulin [facility])   [place] [facility], DO   4 weeks ago Primary hypertension   [place] [facility] [place], [place], PA-C   1 month ago Type 2 diabetes mellitus with hyperglycemia, without long-term current use of insulin [facility])   [place] [facility] Margarita Mail, DO   2 months ago Primary hypertension   [facility] Berniece Salines, FNP   3 months ago Hypervolemia, unspecified hypervolemia type   [facility] Margarita Mail, DO       Future Appointments             Tomorrow Margarita Mail, DO [facility], [facility]   In 1 month Margarita Mail, DO [facility], [facility]

## 2023-08-29 ENCOUNTER — Ambulatory Visit: Payer: Medicaid Other | Admitting: Internal Medicine

## 2023-09-01 ENCOUNTER — Ambulatory Visit: Payer: Medicaid Other | Admitting: Internal Medicine

## 2023-09-01 ENCOUNTER — Ambulatory Visit: Payer: Self-pay

## 2023-09-01 NOTE — Telephone Encounter (Signed)
Summary: Nurse call back, symptomatic   Pt is experiencing bad cough, head congestion, and upset stomach. Since yesterday, has an appt today but has questions for nurse  Best contact:  [phone]     Chief Complaint: Productive cough with green mucus. Symptoms: Above, wheezing Frequency: Weekend Pertinent Negatives: Patient denies  Disposition: [] ED /[] Urgent Care (no appt availability in office) / [x] Appointment(In office/virtual)/ []  [place] [facility]/ [] Home Care/ [] Refused Recommended Disposition /[] [place] [facility]/ []  Follow-up with PCP Additional Notes: agrees with appointment.  Reason for Disposition  [1] MILD difficulty breathing (e.g., minimal/no SOB at rest, SOB with walking, pulse <100) AND [2] still present when not coughing  Answer Assessment - Initial Assessment Questions 1. ONSET: "When did the cough begin?"      Weekend 2. SEVERITY: "How bad is the cough today?"      Severe 3. SPUTUM: "Describe the color of your sputum" (none, dry cough; clear, white, yellow, green)     Green 4. HEMOPTYSIS: "Are you coughing up any blood?" If so ask: "How much?" (flecks, streaks, tablespoons, etc.)     No 5. DIFFICULTY BREATHING: "Are you having difficulty breathing?" If Yes, ask: "How bad is it?" (e.g., mild, moderate, severe)    - MILD: No SOB at rest, mild SOB with walking, speaks normally in sentences, can lie down, no retractions, pulse < 100.    - MODERATE: SOB at rest, SOB with minimal exertion and prefers to sit, cannot lie down flat, speaks in phrases, mild retractions, audible wheezing, pulse 100-120.    - SEVERE: Very SOB at rest, speaks in single words, struggling to breathe, sitting hunched forward, retractions, pulse > 120      Yes 6. FEVER: "Do you have a fever?" If Yes, ask: "What is your temperature, how was it measured, and when did it start?"     101 7. CARDIAC HISTORY: "Do you have any history of heart disease?" (e.g., heart attack, congestive heart  failure)      No 8. LUNG HISTORY: "Do you have any history of lung disease?"  (e.g., pulmonary embolus, asthma, emphysema)     COPD 9. PE RISK FACTORS: "Do you have a history of blood clots?" (or: recent major surgery, recent prolonged travel, bedridden)     nO 10. OTHER SYMPTOMS: "Do you have any other symptoms?" (e.g., runny nose, wheezing, chest pain)       Wheezing 11. PREGNANCY: "Is there any chance you are pregnant?" "When was your last menstrual period?"       No 12. TRAVEL: "Have you traveled out of the country in the last month?" (e.g., travel history, exposures)       No  Protocols used: Cough - Acute Productive-A-AH

## 2023-09-05 ENCOUNTER — Ambulatory Visit: Payer: Self-pay

## 2023-09-05 NOTE — Telephone Encounter (Signed)
Chief Complaint: Cough Symptoms: Productive cough, headache, runny nose, yellowish-green sputum  Frequency: Comes and goes Pertinent Negatives: Patient denies fever, chest pain, nausea, vomiting, congestion  Disposition: [] ED /[] Urgent Care (no appt availability in office) / [] Appointment(In office/virtual)/ []  [place] [facility]/ [x] Home Care/ [] Refused Recommended Disposition /[] [place] [facility]/ []  Follow-up with PCP Additional Notes: Patient stated she has had a cough for over a week now and is coughing up yellowish-green sputum. Patient states she only coughs a lot in the morning and at bedtime. Patient stated she does not cough much throughout the daytime. Patient states when is coughs a lot she gets a headache and sometimes rib pain. Patient asking what she take or do to improve the cough. Care advice was given and patient stated she will try the recommendations at home and if symptoms do not improve she will callback. Patient reminded of upcoming appointment on 09/16/23 with PCP.   Summary: Cough, wants to speak to a nurse   Wants to speak to a nurse about the cough she has had for over a week that is causing her ribs to hurt and has given her a headache.     Reason for Disposition  [1] Continuous (nonstop) coughing interferes with work or school AND [2] no improvement using cough treatment per Care Advice  Answer Assessment - Initial Assessment Questions 1. ONSET: "When did the cough begin?"      Over a week ago  2. SEVERITY: "How bad is the cough today?"      Moderate  3. SPUTUM: "Describe the color of your sputum" (none, dry cough; clear, white, yellow, green)     Yellow-greenish 4. HEMOPTYSIS: "Are you coughing up any blood?" If so ask: "How much?" (flecks, streaks, tablespoons, etc.)     No  5. DIFFICULTY BREATHING: "Are you having difficulty breathing?" If Yes, ask: "How bad is it?" (e.g., mild, moderate, severe)    - MILD: No SOB at rest, mild SOB with walking, speaks  normally in sentences, can lie down, no retractions, pulse < 100.    - MODERATE: SOB at rest, SOB with minimal exertion and prefers to sit, cannot lie down flat, speaks in phrases, mild retractions, audible wheezing, pulse 100-120.    - SEVERE: Very SOB at rest, speaks in single words, struggling to breathe, sitting hunched forward, retractions, pulse > 120      No  6. FEVER: "Do you have a fever?" If Yes, ask: "What is your temperature, how was it measured, and when did it start?"     No      8. LUNG HISTORY: "Do you have any history of lung disease?"  (e.g., pulmonary embolus, asthma, emphysema)     COPD 9. PE RISK FACTORS: "Do you have a history of blood clots?" (or: recent major surgery, recent prolonged travel, bedridden)     No 10. OTHER SYMPTOMS: "Do you have any other symptoms?" (e.g., runny nose, wheezing, chest pain)       Headache, rib pain, runny nose 12. TRAVEL: "Have you traveled out of the country in the last month?" (e.g., travel history, exposures)       No  Protocols used: Cough - Acute Productive-A-AH

## 2023-09-10 DIAGNOSIS — Z419 Encounter for procedure for purposes other than remedying health state, unspecified: Secondary | ICD-10-CM | POA: Diagnosis not present

## 2023-09-14 DIAGNOSIS — I509 Heart failure, unspecified: Secondary | ICD-10-CM | POA: Diagnosis not present

## 2023-09-14 DIAGNOSIS — E1151 Type 2 diabetes mellitus with diabetic peripheral angiopathy without gangrene: Secondary | ICD-10-CM | POA: Diagnosis not present

## 2023-09-14 DIAGNOSIS — F319 Bipolar disorder, unspecified: Secondary | ICD-10-CM | POA: Diagnosis not present

## 2023-09-14 DIAGNOSIS — F4310 Post-traumatic stress disorder, unspecified: Secondary | ICD-10-CM | POA: Diagnosis not present

## 2023-09-14 DIAGNOSIS — E1165 Type 2 diabetes mellitus with hyperglycemia: Secondary | ICD-10-CM | POA: Diagnosis not present

## 2023-09-14 DIAGNOSIS — J439 Emphysema, unspecified: Secondary | ICD-10-CM | POA: Diagnosis not present

## 2023-09-14 DIAGNOSIS — K219 Gastro-esophageal reflux disease without esophagitis: Secondary | ICD-10-CM | POA: Diagnosis not present

## 2023-09-14 DIAGNOSIS — Z008 Encounter for other general examination: Secondary | ICD-10-CM | POA: Diagnosis not present

## 2023-09-14 DIAGNOSIS — I110 Hypertensive heart disease with heart failure: Secondary | ICD-10-CM | POA: Diagnosis not present

## 2023-09-14 DIAGNOSIS — B199 Unspecified viral hepatitis without hepatic coma: Secondary | ICD-10-CM | POA: Diagnosis not present

## 2023-09-14 DIAGNOSIS — K760 Fatty (change of) liver, not elsewhere classified: Secondary | ICD-10-CM | POA: Diagnosis not present

## 2023-09-14 DIAGNOSIS — I69351 Hemiplegia and hemiparesis following cerebral infarction affecting right dominant side: Secondary | ICD-10-CM | POA: Diagnosis not present

## 2023-09-16 ENCOUNTER — Ambulatory Visit (INDEPENDENT_AMBULATORY_CARE_PROVIDER_SITE_OTHER): Payer: Medicaid Other | Admitting: Internal Medicine

## 2023-09-16 ENCOUNTER — Other Ambulatory Visit: Payer: Self-pay

## 2023-09-16 ENCOUNTER — Encounter: Payer: Self-pay | Admitting: Internal Medicine

## 2023-09-16 ENCOUNTER — Ambulatory Visit: Payer: Medicaid Other | Admitting: Internal Medicine

## 2023-09-16 VITALS — BP 128/82 | HR 115 | Temp 97.9°F | Resp 16 | Ht 62.0 in | Wt 216.3 lb

## 2023-09-16 DIAGNOSIS — Z7984 Long term (current) use of oral hypoglycemic drugs: Secondary | ICD-10-CM | POA: Diagnosis not present

## 2023-09-16 DIAGNOSIS — R052 Subacute cough: Secondary | ICD-10-CM

## 2023-09-16 DIAGNOSIS — Z1231 Encounter for screening mammogram for malignant neoplasm of breast: Secondary | ICD-10-CM

## 2023-09-16 DIAGNOSIS — Z7985 Long-term (current) use of injectable non-insulin antidiabetic drugs: Secondary | ICD-10-CM

## 2023-09-16 DIAGNOSIS — E1165 Type 2 diabetes mellitus with hyperglycemia: Secondary | ICD-10-CM

## 2023-09-16 DIAGNOSIS — I10 Essential (primary) hypertension: Secondary | ICD-10-CM | POA: Diagnosis not present

## 2023-09-16 DIAGNOSIS — Z7689 Persons encountering health services in other specified circumstances: Secondary | ICD-10-CM | POA: Diagnosis not present

## 2023-09-16 MED ORDER — OZEMPIC (0.25 OR 0.5 MG/DOSE) 2 MG/3ML ~~LOC~~ SOPN
0.5000 mg | PEN_INJECTOR | SUBCUTANEOUS | 1 refills | Status: DC
Start: 2023-09-16 — End: 2023-11-20

## 2023-09-16 MED ORDER — BENZONATATE 100 MG PO CAPS
100.0000 mg | ORAL_CAPSULE | Freq: Two times a day (BID) | ORAL | 0 refills | Status: DC | PRN
Start: 2023-09-16 — End: 2023-11-20

## 2023-09-16 NOTE — Progress Notes (Signed)
 Established Patient Office Visit  Subjective   [name]: Stephanie [name], female    DOB: 1966/03/19  Age: 58 y.o. MRN: [id number]  Chief Complaint  Patient presents with   Medical Management of Chronic Issues   Cough    For 2 weeks    Cough Pertinent negatives include no chills, fever, heartburn, shortness of breath or wheezing.    Patient is here for follow up on chronic medical conditions. She has had a dry cough for 2 weeks but improving. Taking Dayquil.    Hypertension: -Medications: Lisinopril  10 mg, Amlodipine  2.5 mg, Lasix  40 mg - not taking Metoprolol   -Patient is compliant with above medications and reports no side effects. -Denies any SOB, CP, vision changes, LE edema or symptoms of hypotension -Diet: eating less calories  -Exercise: recently working out at [employer], lost 11 pounds  Diabetes, Type 2: -Last A1c 11/24 8.9% - had a medicare nurse check on 09/13/22 and it was 7.9% -Medications: Ozempic  0.25 mg now for 2 doses, Metformin  500 mg BID, Pioglitazone  15 mg (had been on Glipizide  in the past). Had been prescribed Actos  but not currently taking. Tried to prescribe Rybelsus  in the past but cost was too much.  -Patient is compliant with the above medications and reports no side effects. Doing well so far with the Ozempic , lost about 9 lbs so far -Checking BG at home: 230-300 -Eye exam: Due - patient will call to schedule -Foot exam: UTD -Microalbumin: UTD -Statin: uncertain if taking -PNA vaccine: UTD -Denies symptoms of hypoglycemia, polyuria, polydipsia, numbness extremities, foot ulcers/trauma.  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   Bipolar disorder (HCC) 05/09/2016   Hyperlipidemia, unspecified 05/09/2016   Irritable bowel syndrome 05/09/2016   Lumbago with sciatica 05/09/2016   Esophageal reflux 05/09/2016   Scoliosis 05/09/2016   Diverticulosis of colon 05/09/2016   DDD (degenerative disc disease), cervical 05/09/2016   ICH (intracerebral hemorrhage) (HCC)    Primary hypertension 04/19/2016   Cytotoxic brain edema (HCC) 04/19/2016   IVH (intraventricular hemorrhage) (HCC) 04/18/2016   Bilateral carpal tunnel syndrome 04/12/2016   Falls frequently 01/12/2016   Trigeminal neuralgia 06/08/2015   Sacroiliac dysfunction 03/04/2014   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 or radiculopathy 05/20/2013   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   Tobacco use disorder 09/11/2012   History of cervical cancer 07/30/2012   Hepatitis C    Past Medical History:  Diagnosis Date   Closed fracture of shaft of humerus 01/20/2017   Diabetes mellitus without complication (HCC)    Difficult intubation    Diverticulitis    Headache    Hepatitis C    treated and resolved   History of kidney stones    Hypertension    Hypotension 08/22/2018   IBS (irritable bowel syndrome)    Kidney stones    Sciatica    Sleep apnea    on cpap   Stroke (HCC) 2017   memory loss   Superficial venous thrombosis of arm, left 07/22/2016   Past Surgical History:  Procedure Laterality Date   ABDOMINAL HYSTERECTOMY  2007   carpel tunnel syndrome  2017   HARDWARE REMOVAL Right 12/22/2017   Procedure: HARDWARE REMOVAL-RIGHT ARM;  Surgeon: Leora Lynwood SAUNDERS, MD;  Location: [facility] ORS;  Service: Orthopedics;  Laterality: Right;  Removal of implant, superficial right humerus   HUMERUS IM NAIL Right 03/11/2017   Procedure: INTRAMEDULLARY (IM) NAIL HUMERAL;  Surgeon: Leora Lynwood SAUNDERS, MD;  Location: [facility] ORS;  Service: Orthopedics;  Laterality: Right;   KIDNEY STONE SURGERY Right 02/12/2012   KIDNEY STONE SURGERY Left 07/15/2012   LIGATION OF ARTERIOVENOUS  FISTULA Left 06/21/2016   Procedure: LIGATION OF ARTERIOVENOUS  FISTULA ( LIGATION BASILIC VEIN );  Surgeon: Cordella KANDICE Shawl, MD;  Location: [facility] ORS;  Service: Vascular;  Laterality: Left;   LITHOTRIPSY     OOPHORECTOMY     SINUS EXPLORATION     TONSILLECTOMY     Social History   Tobacco Use   Smoking status: Every Day    Current packs/day: 1.00    Average packs/day: 1 pack/day for 1.5 years (1.5 ttl pk-yrs)    Types: Cigarettes   Smokeless tobacco: Never  Vaping Use   Vaping status: Never Used  Substance Use Topics   Alcohol use: Not Currently    Comment: no alcohol since 2002   Drug use: No   Social History   Socioeconomic History   Marital status: Legally Separated    Spouse name: Not on file   Number of children: Not on file   Years of education: Not on file   Highest education level: Not on file  Occupational History   Not on file  Tobacco Use   Smoking status: Every Day    Current packs/day: 1.00    Average packs/day: 1 pack/day for 1.5 years (1.5 ttl pk-yrs)    Types: Cigarettes   Smokeless tobacco: Never  Vaping Use   Vaping status: Never Used  Substance and Sexual Activity   Alcohol use: Not Currently    Comment: no alcohol since 2002   Drug use: No   Sexual activity: Not on file  Other Topics Concern   Not on file  Social History Narrative   Not on file   Social Drivers of Health   Financial Resource Strain: Medium Risk (05/12/2020)   Received from [facility], [facility]   Overall Financial Resource Strain (CARDIA)    Difficulty of Paying Living Expenses: Somewhat hard  Food Insecurity: No Food Insecurity (11/12/2022)   Hunger Vital Sign    Worried About Running Out of Food in the Last Year: Never true    Ran Out of Food in the Last Year: Never true   Transportation Needs: No Transportation Needs (03/17/2023)   PRAPARE - Administrator, Civil Service (Medical): No    Lack of Transportation (Non-Medical): No  Physical Activity: Not on file  Stress: Not on file  Social Connections: Not on file  Intimate Partner Violence: Not At Risk (03/17/2023)   Humiliation, Afraid, Rape, and Kick questionnaire    Fear of Current or Ex-Partner: No    Emotionally Abused: No    Physically Abused: No    Sexually Abused: No   Family Status  Relation Name Status   Mother  Deceased   Father  Alive   Sister  Deceased  No partnership data on file   Family History  Problem Relation Age of Onset   Heart failure Mother    Diabetes Father  Cancer Sister    Allergies  Allergen Reactions   Gabapentin  Itching   Ibuprofen  Itching   Sulfa  Antibiotics Hives   Tylenol  [Acetaminophen ] Itching   Aspirin Itching   Buprenorphine Hcl Itching   Carbamazepine Itching, Other (See Comments) and Rash    Makes her feel like something is crawling under her skin.   Codeine Itching and Other (See Comments)    Makes her feel like something is crawling under her skin. Can take, sometimes makes her itch   Compazine  Itching and Other (See Comments)    makes my skin crawl   Elemental Sulfur  Hives, Rash and Other (See Comments)    Skin Rashes, Hives   Ioxaglate Itching   Ivp Dye [Iodinated Contrast Media] Itching and Other (See Comments)    Makes her itch really bad. Had to get 2-3 shots of Benadryl  when she was in the hospital.   Metrizamide Itching   Naproxen  Itching   Norco [Hydrocodone -Acetaminophen ] Itching   Other Itching and Other (See Comments)    Makes her itch really bad. Had to get 2-3 shots of Benadryl  when she was in the hospital.   Penicillin G Hives, Rash and Other (See Comments)    Has patient had a PCN reaction causing immediate rash, facial/tongue/throat swelling, SOB or lightheadedness with hypotension: Yes Has patient had a PCN  reaction causing severe rash involving mucus membranes or skin necrosis: No Has patient had a PCN reaction that required hospitalization: No Has patient had a PCN reaction occurring within the last 10 years: No If all of the above answers are NO, then may proceed with Cephalosporin use.    Prochlorperazine  Maleate Other (See Comments)    Compazine  makes her skin crawl - needs 2-3 shots of Benadryl  to get relief.   Reglan  [Metoclopramide ] Itching and Other (See Comments)    Makes her skin crawl   Toradol  [Ketorolac  Tromethamine ] Itching   Tramadol  Itching   Zofran  [Ondansetron  Hcl] Itching      Review of Systems  Constitutional:  Negative for chills and fever.  Respiratory:  Positive for cough. Negative for shortness of breath and wheezing.   Gastrointestinal:  Negative for abdominal pain, constipation, heartburn, nausea and vomiting.      Objective:     BP 128/82 (Cuff Size: Large)   Pulse (!) 115   Temp 97.9 F (36.6 C) (Oral)   Resp 16   Ht 5' 2 (1.575 m)   Wt 216 lb 4.8 oz (98.1 kg)   SpO2 96%   BMI 39.56 kg/m  BP Readings from Last 3 Encounters:  09/16/23 128/82  08/01/23 (!) 138/92  06/30/23 (!) 150/100   Wt Readings from Last 3 Encounters:  09/16/23 216 lb 4.8 oz (98.1 kg)  08/01/23 224 lb (101.6 kg)  06/30/23 220 lb 11.2 oz (100.1 kg)      Physical Exam Constitutional:      Appearance: Normal appearance.  HENT:     Head: Normocephalic and atraumatic.  Eyes:     Conjunctiva/sclera: Conjunctivae normal.  Cardiovascular:     Rate and Rhythm: Normal rate and regular rhythm.  Pulmonary:     Effort: Pulmonary effort is normal.     Breath sounds: Normal breath sounds. No wheezing, rhonchi or rales.  Skin:    General: Skin is warm and dry.  Neurological:     General: No focal deficit present.     Mental Status: She is alert. Mental status is at baseline.  Psychiatric:  Mood and Affect: Mood normal.        Behavior: Behavior normal.       No results found for any visits on 09/16/23.  Last CBC Lab Results  Component Value Date   WBC 7.7 06/09/2023   HGB 13.7 06/09/2023   HCT 41.4 06/09/2023   MCV 90.6 06/09/2023   MCH 30.0 06/09/2023   RDW 12.7 06/09/2023   PLT 219 06/09/2023   Last metabolic panel Lab Results  Component Value Date   GLUCOSE 310 (H) 06/09/2023   NA 137 06/09/2023   K 4.6 06/09/2023   CL 100 06/09/2023   CO2 23 06/09/2023   BUN 11 06/09/2023   CREATININE 0.78 06/09/2023   GFRNONAA >60 06/09/2023   CALCIUM  9.7 06/09/2023   PHOS 2.3 (L) 08/22/2018   PROT 8.0 05/29/2023   ALBUMIN 4.2 05/29/2023   BILITOT 0.4 05/29/2023   ALKPHOS 98 05/29/2023   AST 27 05/29/2023   ALT 34 05/29/2023   ANIONGAP 14 06/09/2023   Last lipids Lab Results  Component Value Date   CHOL 123 02/13/2022   HDL 73 02/13/2022   LDLCALC 35 02/13/2022   TRIG 69 02/13/2022   CHOLHDL 1.7 02/13/2022   Last hemoglobin A1c Lab Results  Component Value Date   HGBA1C 8.9 (A) 08/01/2023   Last thyroid  functions Lab Results  Component Value Date   TSH 1.218 04/16/2022   Last vitamin D  No results found for: MARIEN BOLLS, VD25OH Last vitamin B12 and Folate Lab Results  Component Value Date   VITAMINB12 380 02/20/2022      The ASCVD Risk score (Arnett DK, et al., 2019) failed to calculate for the following reasons:   Risk score cannot be calculated because patient has a medical history suggesting prior/existing ASCVD    Assessment & Plan:  Type 2 diabetes mellitus with hyperglycemia, without long-term current use of insulin  (HCC) Assessment & Plan: Doing well with Ozempic  will increase to 0.5 mg, recheck A1c in 2 months.   Orders: -     Ozempic  (0.25 or 0.5 MG/DOSE); Inject 0.5 mg into the skin once a week.  Dispense: 3 mL; Refill: 1  Primary hypertension Assessment & Plan: Blood pressure better, reviewed medications but no changes made.    Subacute cough -     Benzonatate ; Take 1  capsule (100 mg total) by mouth 2 (two) times daily as needed for cough.  Dispense: 20 capsule; Refill: 0  Encounter for screening mammogram for malignant neoplasm of breast -     3D Screening Mammogram, Left and Right; Future  Exam benign, prescribe cough suppressant. Reorder mammogram.   Return in about 2 months (around 11/14/2023) for after 2/22, follow up on a1c.    Sharyle Fischer, DO

## 2023-09-16 NOTE — Assessment & Plan Note (Signed)
 Blood pressure better, reviewed medications but no changes made.

## 2023-09-16 NOTE — Assessment & Plan Note (Signed)
 Doing well with Ozempic will increase to 0.5 mg, recheck A1c in 2 months.

## 2023-09-18 IMAGING — CR DG FOREARM 2V*L*
2 series · 2 of 2 positions shown · non-contrast
Comparison: None.

CLINICAL DATA: Fall

EXAM:
LEFT HUMERUS - 2+ VIEW; LEFT FOREARM - 2 VIEW

[x forearm ap left]
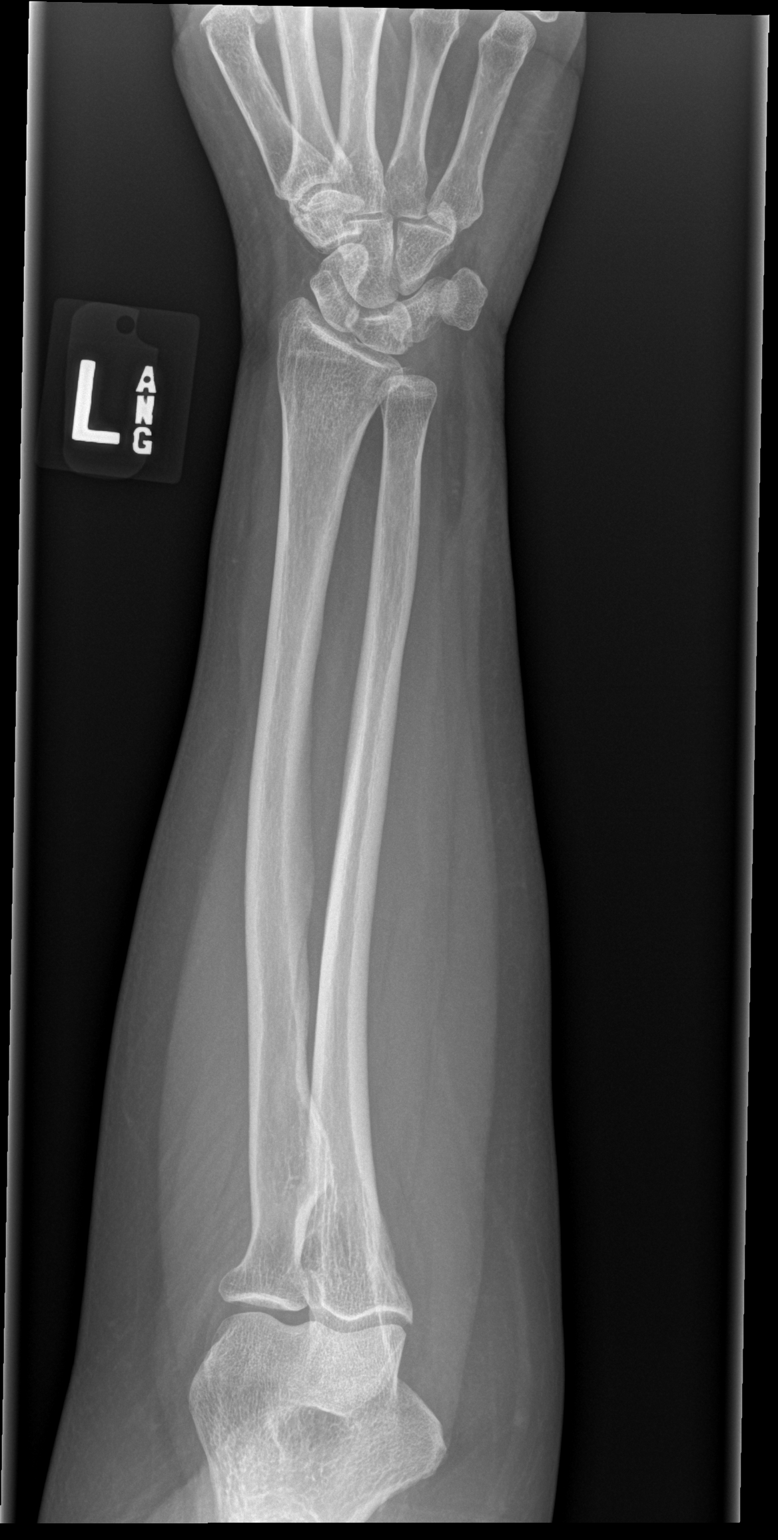

[x forearm lat left]
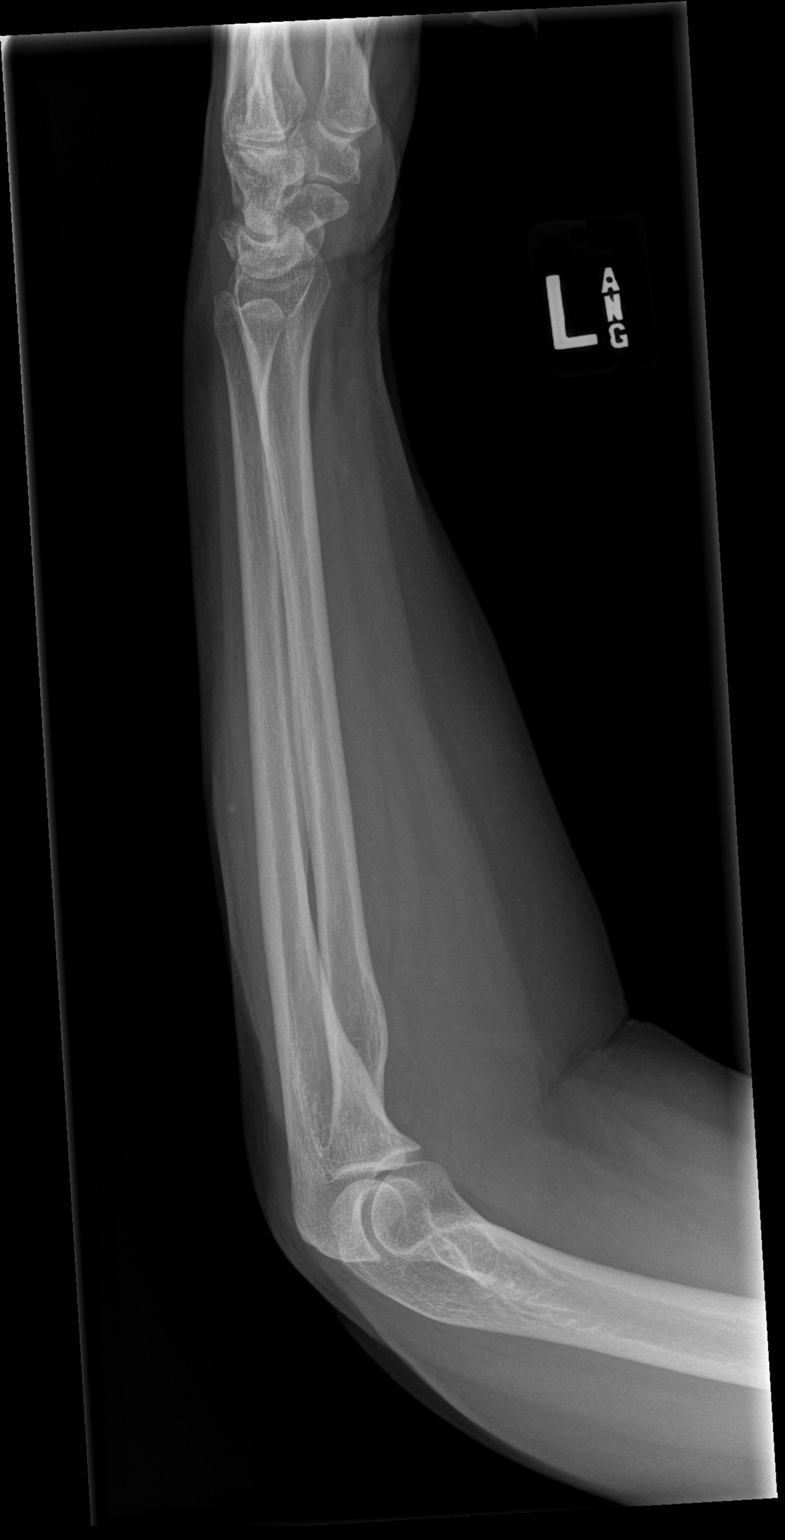

[2 of 2 positions shown; findings below may reference images not displayed]

FINDINGS: There is no evidence of fracture or other focal bone lesions. Soft
tissues are unremarkable.
IMPRESSION: No acute osseous abnormality of the left humerus or left forearm.

## 2023-09-18 IMAGING — CR DG HUMERUS 2V *L*
2 series · 2 of 2 positions shown · non-contrast
Comparison: None.

CLINICAL DATA: Fall

EXAM:
LEFT HUMERUS - 2+ VIEW; LEFT FOREARM - 2 VIEW

[w humerus ap left]
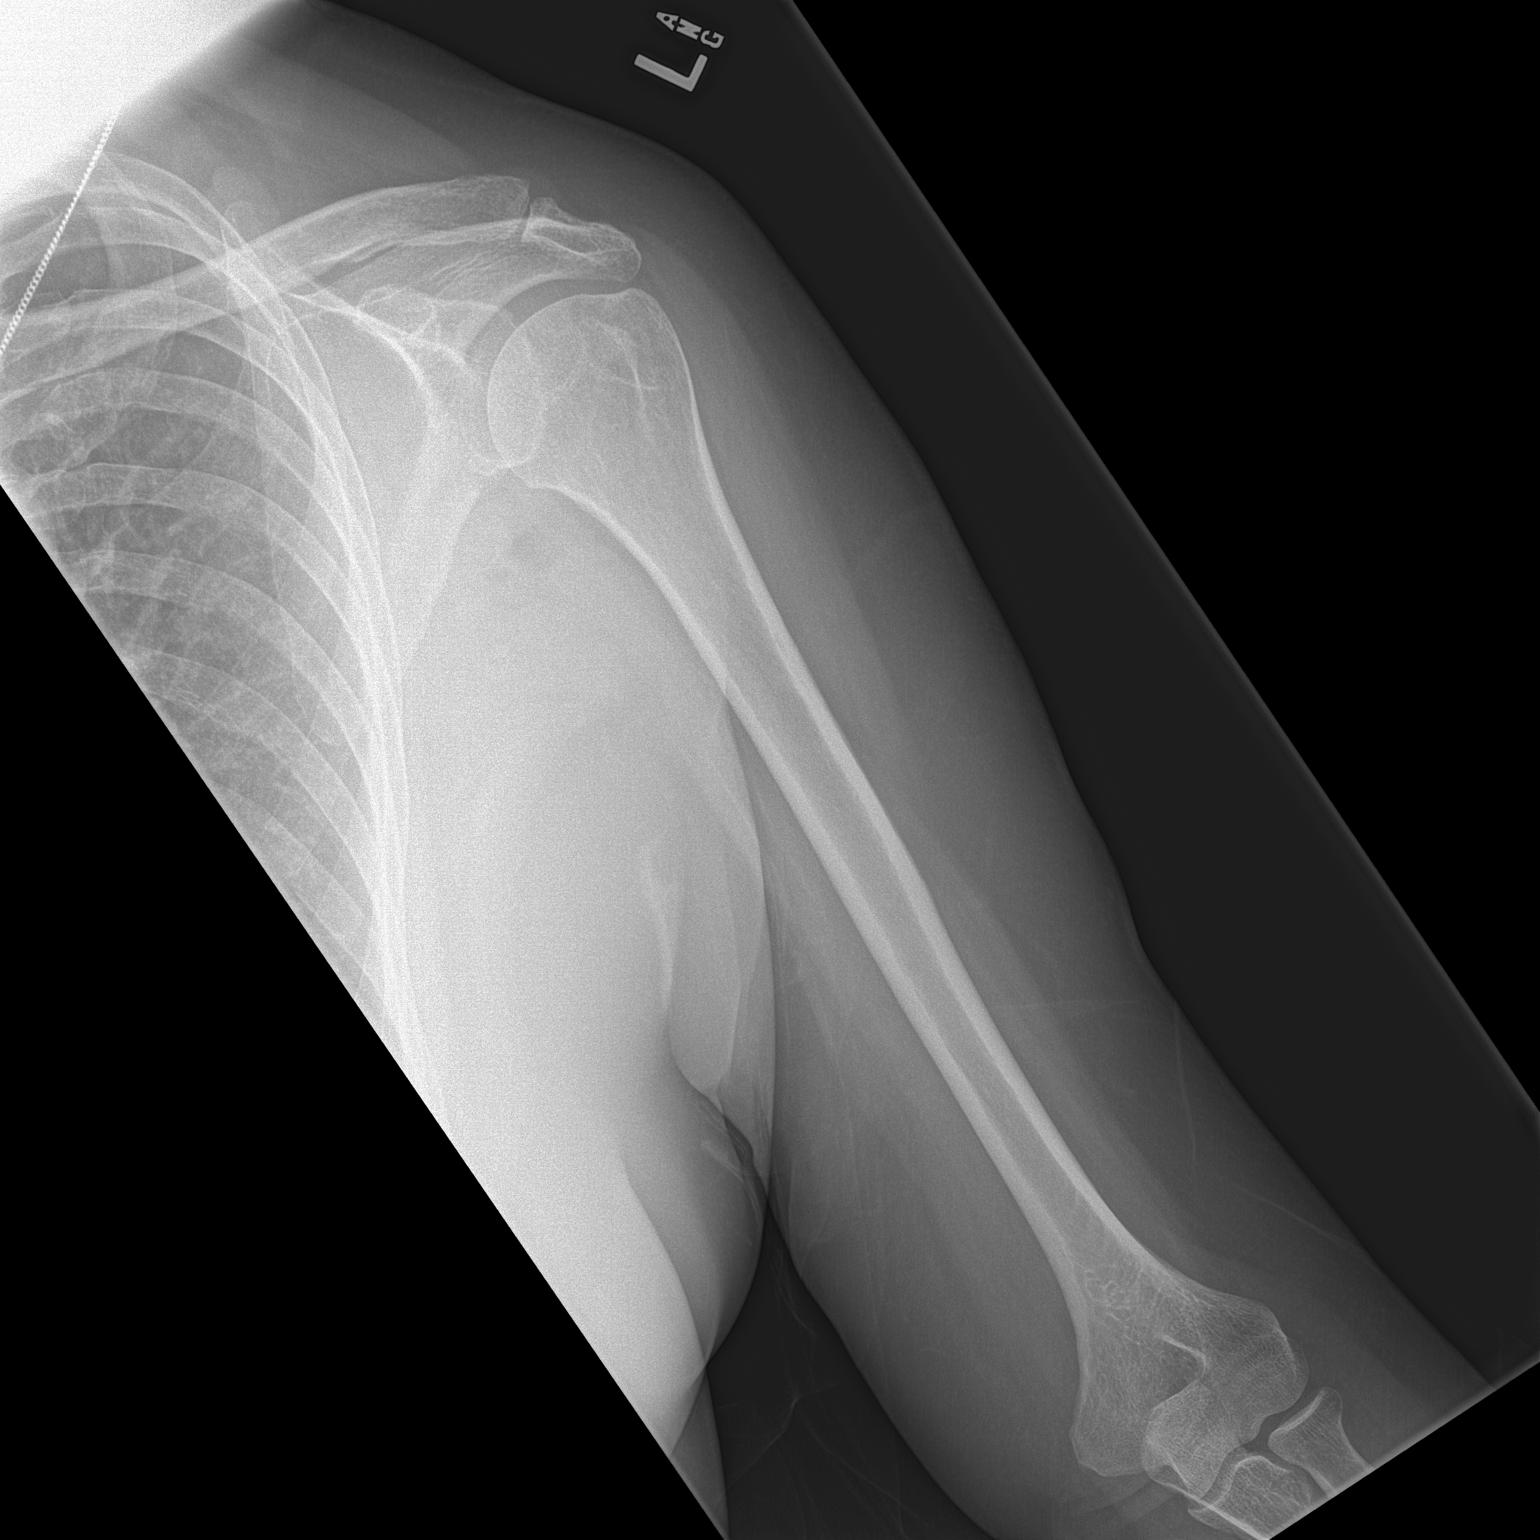

[w humerus lat left]
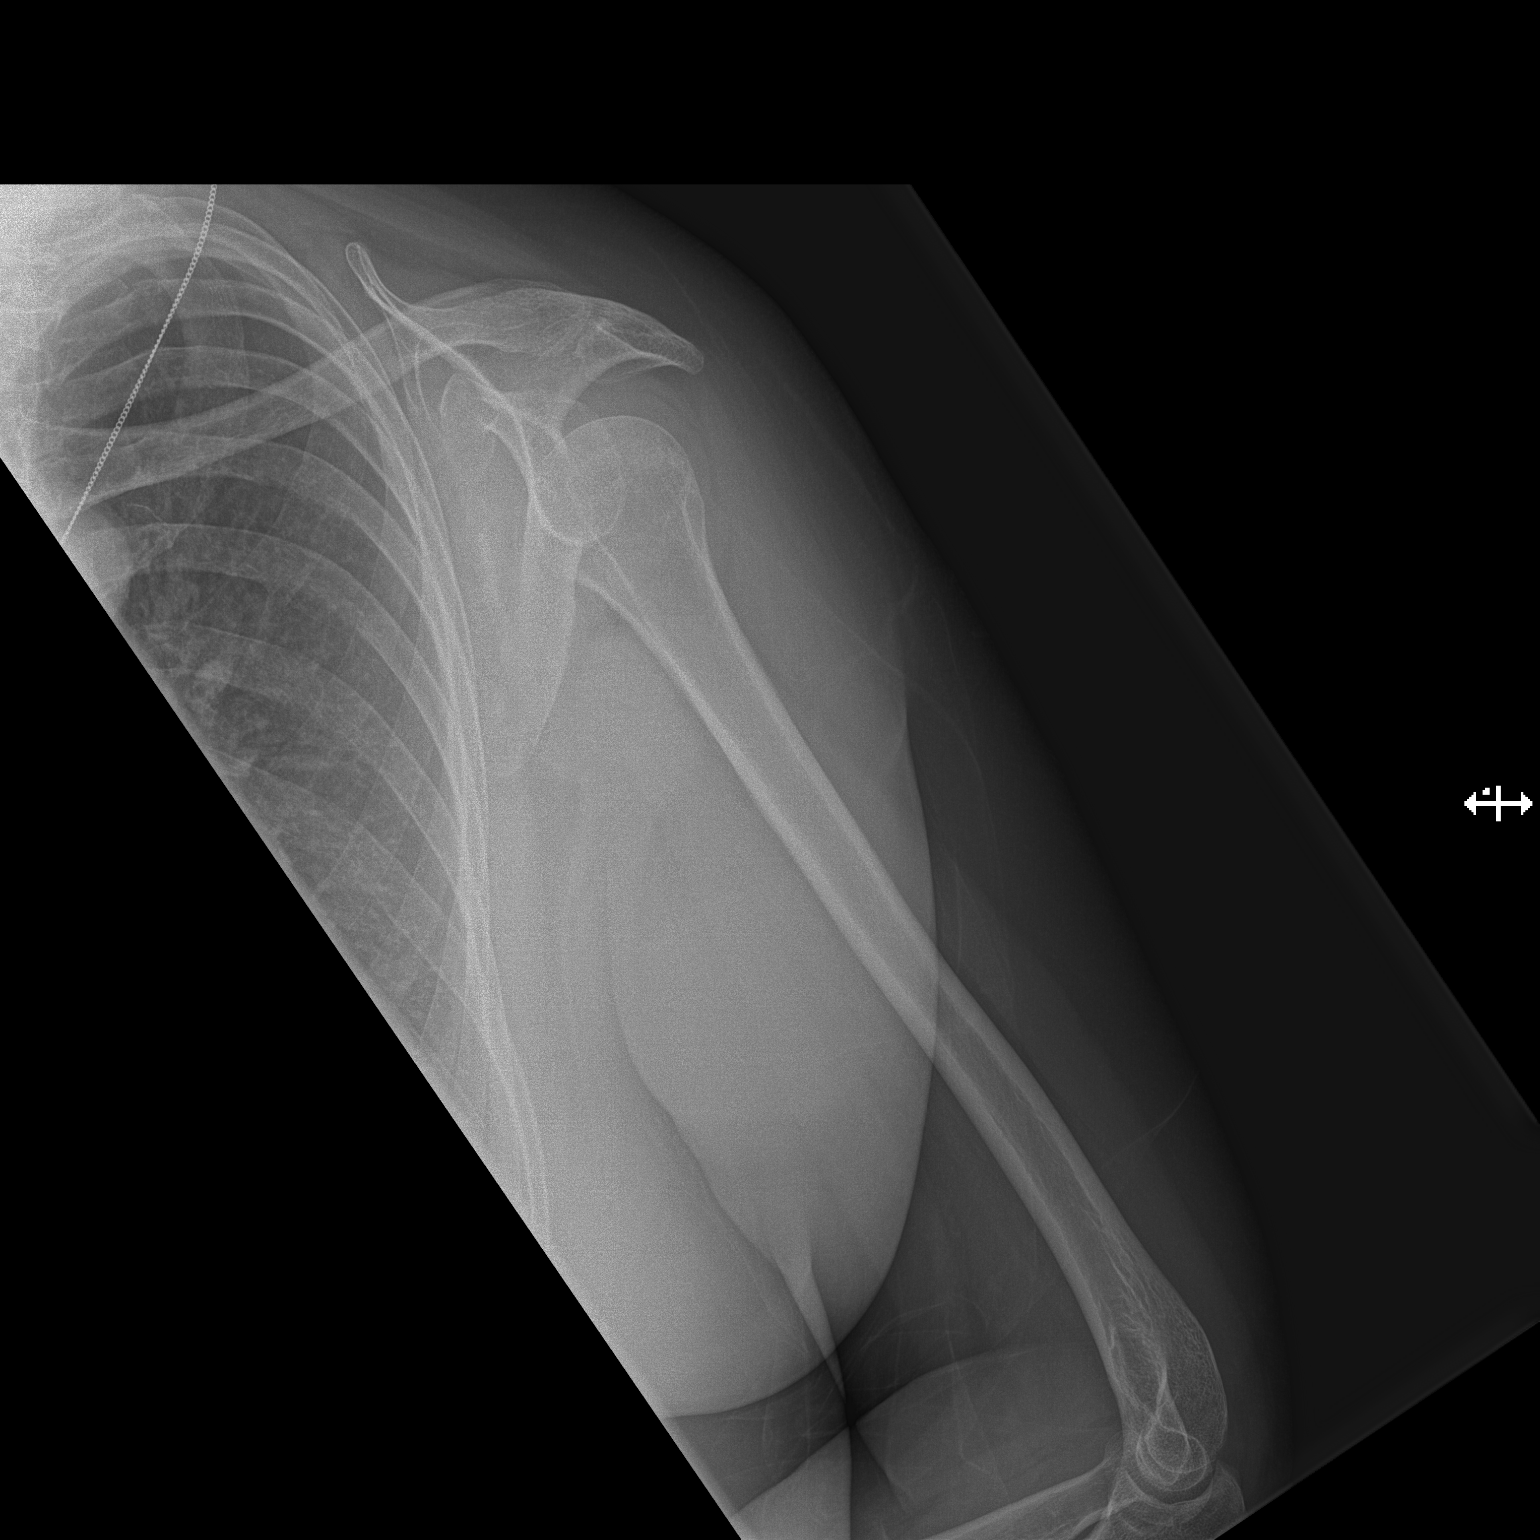

[2 of 2 positions shown; findings below may reference images not displayed]

FINDINGS: There is no evidence of fracture or other focal bone lesions. Soft
tissues are unremarkable.
IMPRESSION: No acute osseous abnormality of the left humerus or left forearm.

## 2023-09-19 ENCOUNTER — Other Ambulatory Visit: Payer: Self-pay | Admitting: Internal Medicine

## 2023-09-19 DIAGNOSIS — I10 Essential (primary) hypertension: Secondary | ICD-10-CM

## 2023-09-22 NOTE — Telephone Encounter (Signed)
 Requested Prescriptions  Pending Prescriptions Disp Refills   lisinopril  (ZESTRIL ) 10 MG tablet [Pharmacy Med Name: Lisinopril  10 MG Oral Tablet] 30 tablet 0    Sig: Take 1 tablet by mouth once daily     Cardiovascular:  ACE Inhibitors Passed - 09/22/2023  2:05 PM      Passed - Cr in normal range and within 180 days    Creat  Date Value Ref Range Status  05/19/2023 0.73 0.50 - 1.03 mg/dL Final   Creatinine, Ser  Date Value Ref Range Status  06/09/2023 0.78 0.44 - 1.00 mg/dL Final   Creatinine, Urine  Date Value Ref Range Status  06/30/2023 118 20 - 275 mg/dL Final         Passed - K in normal range and within 180 days    Potassium  Date Value Ref Range Status  06/09/2023 4.6 3.5 - 5.1 mmol/L Final  03/30/2014 3.7 3.5 - 5.1 mmol/L Final         Passed - Patient is not pregnant      Passed - Last BP in normal range    BP Readings from Last 1 Encounters:  09/16/23 128/82         Passed - Valid encounter within last 6 months    Recent Outpatient Visits           6 days ago Type 2 diabetes mellitus with hyperglycemia, without long-term current use of insulin  [facility])   [place] [facility] Bernardo Fend, DO   1 month ago Type 2 diabetes mellitus with hyperglycemia, without long-term current use of insulin  [facility]   [place] [facility] Bernardo Fend, DO   1 month ago Primary hypertension   [facility] [place], [place], PA-C   2 months ago Type 2 diabetes mellitus with hyperglycemia, without long-term current use of insulin  [facility])   [facility] Bernardo Fend, DO   3 months ago Primary hypertension   [facility] Gareth Mliss FALCON, FNP       Future Appointments             In 1 month Bernardo Fend, DO [facility], [facility]   In 2 months Bernardo Fend, DO [facility], [facility]

## 2023-09-23 ENCOUNTER — Other Ambulatory Visit: Payer: Self-pay | Admitting: Internal Medicine

## 2023-09-23 DIAGNOSIS — E1165 Type 2 diabetes mellitus with hyperglycemia: Secondary | ICD-10-CM

## 2023-09-24 NOTE — Telephone Encounter (Signed)
 Requested Prescriptions  Pending Prescriptions Disp Refills   pioglitazone  (ACTOS ) 15 MG tablet [Pharmacy Med Name: Pioglitazone  HCl 15 MG Oral Tablet] 90 tablet 0    Sig: Take 1 tablet by mouth once daily     Endocrinology:  Diabetes - Glitazones - pioglitazone  Failed - 09/24/2023  9:53 AM      Failed - HBA1C is between 0 and 7.9 and within 180 days    Hemoglobin A1C  Date Value Ref Range Status  08/01/2023 8.9 (A) 4.0 - 5.6 % Final   Hgb A1c MFr Bld  Date Value Ref Range Status  04/15/2023 7.2 (H) <5.7 % of total Hgb Final    Comment:    For someone without known diabetes, a hemoglobin A1c value of 6.5% or greater indicates that they may have  diabetes and this should be confirmed with a follow-up  test. . For someone with known diabetes, a value <7% indicates  that their diabetes is well controlled and a value  greater than or equal to 7% indicates suboptimal  control. A1c targets should be individualized based on  duration of diabetes, age, comorbid conditions, and  other considerations. . Currently, no consensus exists regarding use of hemoglobin A1c for diagnosis of diabetes for children. Temple Feeler - Valid encounter within last 6 months    Recent Outpatient Visits           1 week ago Type 2 diabetes mellitus with hyperglycemia, without long-term current use of insulin  [facility])   [place] [facility] Rockney Cid, DO   1 month ago Type 2 diabetes mellitus with hyperglycemia, without long-term current use of insulin  [facility])   [place] [facility] Rockney Cid, DO   1 month ago Primary hypertension   [facility] [place], [place], PA-C   2 months ago Type 2 diabetes mellitus with hyperglycemia, without long-term current use of insulin  [facility])   [facility] Rockney Cid, DO   3 months ago Primary hypertension   [facility] Quinton Buckler, FNP       Future Appointments             In 1 month [facility] Cid, DO [facility], [facility]   In 2 months Rockney Cid, DO [facility], [facility]

## 2023-09-25 DIAGNOSIS — E119 Type 2 diabetes mellitus without complications: Secondary | ICD-10-CM | POA: Diagnosis not present

## 2023-09-25 DIAGNOSIS — Z23 Encounter for immunization: Secondary | ICD-10-CM | POA: Diagnosis not present

## 2023-09-25 DIAGNOSIS — G4733 Obstructive sleep apnea (adult) (pediatric): Secondary | ICD-10-CM | POA: Diagnosis not present

## 2023-10-01 DIAGNOSIS — G4733 Obstructive sleep apnea (adult) (pediatric): Secondary | ICD-10-CM | POA: Diagnosis not present

## 2023-10-02 DIAGNOSIS — F419 Anxiety disorder, unspecified: Secondary | ICD-10-CM | POA: Diagnosis not present

## 2023-10-02 DIAGNOSIS — S0240DA Maxillary fracture, left side, initial encounter for closed fracture: Secondary | ICD-10-CM | POA: Diagnosis not present

## 2023-10-02 DIAGNOSIS — S199XXA Unspecified injury of neck, initial encounter: Secondary | ICD-10-CM | POA: Diagnosis not present

## 2023-10-02 DIAGNOSIS — Z886 Allergy status to analgesic agent status: Secondary | ICD-10-CM | POA: Diagnosis not present

## 2023-10-02 DIAGNOSIS — K2100 Gastro-esophageal reflux disease with esophagitis, without bleeding: Secondary | ICD-10-CM | POA: Diagnosis not present

## 2023-10-02 DIAGNOSIS — I10 Essential (primary) hypertension: Secondary | ICD-10-CM | POA: Diagnosis not present

## 2023-10-02 DIAGNOSIS — S60212A Contusion of left wrist, initial encounter: Secondary | ICD-10-CM | POA: Diagnosis not present

## 2023-10-02 DIAGNOSIS — F319 Bipolar disorder, unspecified: Secondary | ICD-10-CM | POA: Diagnosis not present

## 2023-10-02 DIAGNOSIS — S299XXA Unspecified injury of thorax, initial encounter: Secondary | ICD-10-CM | POA: Diagnosis not present

## 2023-10-02 DIAGNOSIS — J449 Chronic obstructive pulmonary disease, unspecified: Secondary | ICD-10-CM | POA: Diagnosis not present

## 2023-10-02 DIAGNOSIS — E119 Type 2 diabetes mellitus without complications: Secondary | ICD-10-CM | POA: Diagnosis not present

## 2023-10-02 DIAGNOSIS — M25561 Pain in right knee: Secondary | ICD-10-CM | POA: Diagnosis not present

## 2023-10-02 DIAGNOSIS — Z882 Allergy status to sulfonamides status: Secondary | ICD-10-CM | POA: Diagnosis not present

## 2023-10-02 DIAGNOSIS — Z86718 Personal history of other venous thrombosis and embolism: Secondary | ICD-10-CM | POA: Diagnosis not present

## 2023-10-02 DIAGNOSIS — M5137 Other intervertebral disc degeneration, lumbosacral region with discogenic back pain only: Secondary | ICD-10-CM | POA: Diagnosis not present

## 2023-10-02 DIAGNOSIS — E785 Hyperlipidemia, unspecified: Secondary | ICD-10-CM | POA: Diagnosis not present

## 2023-10-02 DIAGNOSIS — M546 Pain in thoracic spine: Secondary | ICD-10-CM | POA: Diagnosis not present

## 2023-10-02 DIAGNOSIS — Z7689 Persons encountering health services in other specified circumstances: Secondary | ICD-10-CM | POA: Diagnosis not present

## 2023-10-02 DIAGNOSIS — G44309 Post-traumatic headache, unspecified, not intractable: Secondary | ICD-10-CM | POA: Diagnosis not present

## 2023-10-02 DIAGNOSIS — M479 Spondylosis, unspecified: Secondary | ICD-10-CM | POA: Diagnosis not present

## 2023-10-02 DIAGNOSIS — Z8673 Personal history of transient ischemic attack (TIA), and cerebral infarction without residual deficits: Secondary | ICD-10-CM | POA: Diagnosis not present

## 2023-10-10 ENCOUNTER — Ambulatory Visit: Payer: Medicaid Other | Admitting: Internal Medicine

## 2023-10-10 ENCOUNTER — Other Ambulatory Visit: Payer: Self-pay

## 2023-10-10 ENCOUNTER — Encounter: Payer: Self-pay | Admitting: Internal Medicine

## 2023-10-10 VITALS — BP 124/82 | HR 100 | Temp 98.0°F | Resp 16 | Ht 62.0 in | Wt 214.4 lb

## 2023-10-10 DIAGNOSIS — S0240DD Maxillary fracture, left side, subsequent encounter for fracture with routine healing: Secondary | ICD-10-CM | POA: Diagnosis not present

## 2023-10-10 NOTE — Progress Notes (Signed)
Established Patient Office Visit  Subjective   [name]: Stephanie [name], female    DOB: [birth year]  Age: 58 y.o. MRN: [id number]  Chief Complaint  Patient presents with   Facial Injury    Larey Seat face first  1 week ago. Fell at bus pick up at [facility]. Was seen there in ER    HPI  Patient is here for ER follow up. Fell 1 week ago.   Discharge Date: 10/02/23 Hospital/facility: [facility] Diagnosis: closed fracture of left maxilla after fall Procedures/tests: CT maxillofacial with mildly displaced fracture of the left frontal process of maxilla. CT cervical, thoracic, lumbar all normal. Knee x-ray normal  New medications: pain medication Discontinued medications: None Discharge instructions:  Follow up with facial surgery Status: fluctuating  Patient doing better in terms of pain, still has abrasions and bruising.   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   Bipolar disorder (HCC) 05/09/2016   Hyperlipidemia, unspecified 05/09/2016   Irritable bowel syndrome 05/09/2016   Lumbago with sciatica 05/09/2016   Esophageal reflux 05/09/2016   Scoliosis 05/09/2016   Diverticulosis of colon 05/09/2016   DDD (degenerative disc disease), cervical 05/09/2016   ICH (intracerebral hemorrhage) (HCC)    Primary hypertension 04/19/2016   Cytotoxic brain edema (HCC) 04/19/2016   IVH (intraventricular hemorrhage) (HCC) 04/18/2016   Bilateral carpal tunnel syndrome 04/12/2016   Falls frequently 01/12/2016   Trigeminal  neuralgia 06/08/2015   Sacroiliac dysfunction 03/04/2014   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 or radiculopathy 05/20/2013   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   Tobacco use disorder 09/11/2012   History of cervical cancer 07/30/2012   Hepatitis C    Past Medical History:  Diagnosis Date   Closed fracture of shaft of humerus 01/20/2017   Diabetes mellitus without complication (HCC)    Difficult intubation    Diverticulitis    Headache    Hepatitis C    treated and resolved   History of kidney stones    Hypertension    Hypotension 08/22/2018   IBS (irritable bowel syndrome)    Kidney stones    Sciatica    Sleep apnea    on cpap   Stroke (HCC) 2017   memory loss   Superficial venous thrombosis of arm, left 07/22/2016   Past Surgical History:  Procedure Laterality Date   ABDOMINAL HYSTERECTOMY  2007   carpel tunnel syndrome  2017   HARDWARE REMOVAL Right 12/22/2017   Procedure: HARDWARE REMOVAL-RIGHT ARM;  Surgeon: Lyndle Herrlich, MD;  Location: [facility] ORS;  Service: Orthopedics;  Laterality: Right;  Removal of implant, superficial right humerus   HUMERUS IM NAIL Right 03/11/2017   Procedure: INTRAMEDULLARY (IM) NAIL HUMERAL;  Surgeon: Lyndle Herrlich, MD;  Location: [facility] ORS;  Service: Orthopedics;  Laterality: Right;   KIDNEY STONE SURGERY Right 02/12/2012   KIDNEY STONE SURGERY Left 07/15/2012   LIGATION OF ARTERIOVENOUS  FISTULA Left 06/21/2016   Procedure: LIGATION OF ARTERIOVENOUS  FISTULA ( LIGATION BASILIC VEIN );  Surgeon: Renford Dills, MD;  Location:  [facility] ORS;  Service: Vascular;  Laterality: Left;   LITHOTRIPSY     OOPHORECTOMY     SINUS EXPLORATION     TONSILLECTOMY     Social History   Tobacco Use   Smoking status: Every Day    Current packs/day: 1.00    Average packs/day: 1 pack/day for 1.5 years (1.5 ttl pk-yrs)    Types: Cigarettes   Smokeless tobacco: Never  Vaping Use   Vaping status: Never Used  Substance Use  Topics   Alcohol use: Not Currently    Comment: no alcohol since 2002   Drug use: No   Social History   Socioeconomic History   Marital status: Legally Separated    Spouse name: Not on file   Number of children: Not on file   Years of education: Not on file   Highest education level: Not on file  Occupational History   Not on file  Tobacco Use   Smoking status: Every Day    Current packs/day: 1.00    Average packs/day: 1 pack/day for 1.5 years (1.5 ttl pk-yrs)    Types: Cigarettes   Smokeless tobacco: Never  Vaping Use   Vaping status: Never Used  Substance and Sexual Activity   Alcohol use: Not Currently    Comment: no alcohol since 2002   Drug use: No   Sexual activity: Not on file  Other Topics Concern   Not on file  Social History Narrative   Not on file   Social Drivers of Health   Financial Resource Strain: Medium Risk (05/12/2020)   [facility]   Overall Financial Resource Strain (CARDIA)    Difficulty of Paying Living Expenses: Somewhat hard  Food Insecurity: No Food Insecurity (11/12/2022)   Hunger Vital Sign    Worried About Running Out of Food in the Last Year: Never true    Ran Out of Food in the Last Year: Never true  Transportation Needs: No Transportation Needs (03/17/2023)   PRAPARE - Administrator, Civil Service (Medical): No    Lack of Transportation (Non-Medical): No  Physical Activity: Not on file  Stress: Not on file  Social Connections: Not on file  Intimate Partner Violence: Not At Risk (03/17/2023)   Humiliation, Afraid, Rape, and Kick questionnaire    Fear of Current or Ex-Partner: No    Emotionally Abused: No    Physically Abused: No    Sexually Abused: No   Family Status  Relation Name Status   Mother  Deceased   Father  Alive   Sister  Deceased  No partnership data on file   Family History  Problem Relation Age of Onset   Heart failure Mother    Diabetes Father    Cancer Sister     Allergies  Allergen Reactions   Gabapentin Itching   Ibuprofen Itching   Sulfa Antibiotics Hives   Tylenol [Acetaminophen] Itching   Aspirin Itching   Buprenorphine Hcl Itching   Carbamazepine Itching, Other (See Comments) and Rash    Makes her feel like something is crawling under her skin.   Codeine Itching and Other (See Comments)    Makes her feel like something is crawling under her skin. Can take, sometimes makes her itch   Compazine Itching and Other (See Comments)    "makes my skin crawl"   Elemental Sulfur Hives, Rash and Other (See Comments)    Skin Rashes, Hives   Ioxaglate Itching   Ivp  Dye [Iodinated Contrast Media] Itching and Other (See Comments)    Makes her itch really bad. Had to get 2-3 shots of Benadryl when she was in the hospital.   Metrizamide Itching   Naproxen Itching   Norco [Hydrocodone-Acetaminophen] Itching   Other Itching and Other (See Comments)    Makes her itch really bad. Had to get 2-3 shots of Benadryl when she was in the hospital.   Penicillin G Hives, Rash and Other (See Comments)    Has patient had a PCN reaction causing immediate rash, facial/tongue/throat swelling, SOB or lightheadedness with hypotension: Yes Has patient had a PCN reaction causing severe rash involving mucus membranes or skin necrosis: No Has patient had a PCN reaction that required hospitalization: No Has patient had a PCN reaction occurring within the last 10 years: No If all of the above answers are "NO", then may proceed with Cephalosporin use.    Prochlorperazine Maleate Other (See Comments)    Compazine makes her skin crawl - needs 2-3 shots of Benadryl to get relief.   Reglan [Metoclopramide] Itching and Other (See Comments)    Makes her skin crawl   Toradol [Ketorolac Tromethamine] Itching   Tramadol Itching   Zofran [Ondansetron Hcl] Itching      Review of Systems  All other systems reviewed and are negative.     Objective:     BP 124/82 (Cuff  Size: Large)   Pulse 100   Temp 98 F (36.7 C) (Oral)   Resp 16   Ht 5\' 2"  (1.575 m)   Wt 214 lb 6.4 oz (97.3 kg)   SpO2 97%   BMI 39.21 kg/m  BP Readings from Last 3 Encounters:  10/10/23 124/82  09/16/23 128/82  08/01/23 (!) 138/92   Wt Readings from Last 3 Encounters:  10/10/23 214 lb 6.4 oz (97.3 kg)  09/16/23 216 lb 4.8 oz (98.1 kg)  08/01/23 224 lb (101.6 kg)      Physical Exam Constitutional:      Appearance: Normal appearance.  HENT:     Head: Normocephalic and atraumatic.     Nose:     Comments: Swelling in left inferior turbinate with left maxillary hematoma Eyes:     Conjunctiva/sclera: Conjunctivae normal.  Cardiovascular:     Rate and Rhythm: Normal rate and regular rhythm.  Pulmonary:     Effort: Pulmonary effort is normal.     Breath sounds: Normal breath sounds.  Musculoskeletal:     Comments: Healing abrasions on right knee, left arm  Skin:    General: Skin is warm and dry.  Neurological:     General: No focal deficit present.     Mental Status: She is alert. Mental status is at baseline.  Psychiatric:        Mood and Affect: Mood normal.        Behavior: Behavior normal.      No results found for any visits on 10/10/23.  Last CBC Lab Results  Component Value Date   WBC 7.7 06/09/2023   HGB 13.7 06/09/2023   HCT 41.4 06/09/2023   MCV 90.6 06/09/2023   MCH 30.0 06/09/2023   RDW 12.7 06/09/2023   PLT 219 06/09/2023   Last metabolic panel Lab Results  Component Value Date   GLUCOSE 310 (H) 06/09/2023   NA 137 06/09/2023   K 4.6 06/09/2023   CL 100 06/09/2023   CO2 23 06/09/2023   BUN 11 06/09/2023   CREATININE 0.78 06/09/2023   GFRNONAA >60  06/09/2023   CALCIUM 9.7 06/09/2023   PHOS 2.3 (L) 08/22/2018   PROT 8.0 05/29/2023   ALBUMIN 4.2 05/29/2023   BILITOT 0.4 05/29/2023   ALKPHOS 98 05/29/2023   AST 27 05/29/2023   ALT 34 05/29/2023   ANIONGAP 14 06/09/2023   Last lipids Lab Results  Component Value Date   CHOL 123  02/13/2022   HDL 73 02/13/2022   LDLCALC 35 02/13/2022   TRIG 69 02/13/2022   CHOLHDL 1.7 02/13/2022   Last hemoglobin A1c Lab Results  Component Value Date   HGBA1C 8.9 (A) 08/01/2023   Last thyroid functions Lab Results  Component Value Date   TSH 1.218 04/16/2022   Last vitamin D No results found for: "25OHVITD2", "25OHVITD3", "VD25OH" Last vitamin B12 and Folate Lab Results  Component Value Date   VITAMINB12 380 02/20/2022      The ASCVD Risk score (Arnett DK, et al., 2019) failed to calculate for the following reasons:   Risk score cannot be calculated because patient has a medical history suggesting prior/existing ASCVD    Assessment & Plan:   1. Closed fracture of left side of maxilla with routine healing, subsequent encounter (Primary): Reviewed ER note and imaging from 10/02/23. Patient's pain is controlled and she has outpatient follow up scheduled with facial surgery.    Return for already scheduled.    Margarita Mail, DO

## 2023-10-11 DIAGNOSIS — Z419 Encounter for procedure for purposes other than remedying health state, unspecified: Secondary | ICD-10-CM | POA: Diagnosis not present

## 2023-10-13 DIAGNOSIS — F411 Generalized anxiety disorder: Secondary | ICD-10-CM | POA: Diagnosis not present

## 2023-10-13 DIAGNOSIS — F4312 Post-traumatic stress disorder, chronic: Secondary | ICD-10-CM | POA: Diagnosis not present

## 2023-10-13 DIAGNOSIS — F410 Panic disorder [episodic paroxysmal anxiety] without agoraphobia: Secondary | ICD-10-CM | POA: Diagnosis not present

## 2023-10-13 DIAGNOSIS — F319 Bipolar disorder, unspecified: Secondary | ICD-10-CM | POA: Diagnosis not present

## 2023-10-16 ENCOUNTER — Other Ambulatory Visit: Payer: Self-pay | Admitting: Internal Medicine

## 2023-10-16 DIAGNOSIS — M7989 Other specified soft tissue disorders: Secondary | ICD-10-CM

## 2023-10-17 ENCOUNTER — Other Ambulatory Visit: Payer: Self-pay | Admitting: Internal Medicine

## 2023-10-17 DIAGNOSIS — I10 Essential (primary) hypertension: Secondary | ICD-10-CM

## 2023-10-17 NOTE — Telephone Encounter (Signed)
 Requested medication (s) are due for refill today: yes  Requested medication (s) are on the active medication list: yes  Last refill:  09/06/22 #90 2 RF  Future visit scheduled: yes  Notes to clinic:  Mg level overdue    Requested Prescriptions  Pending Prescriptions Disp Refills   furosemide  (LASIX ) 40 MG tablet [Pharmacy Med Name: Furosemide  40 MG Oral Tablet] 30 tablet 0    Sig: TAKE 1 TABLET BY MOUTH TWICE DAILY AS NEEDED FOR EDEMA IN THE MORNING. CAN TAKE ADDITIONAL TABLET IN THE AFTERNOON.     Cardiovascular:  Diuretics - Loop Failed - 10/17/2023  9:53 AM      Failed - Mg Level in normal range and within 180 days    Magnesium   Date Value Ref Range Status  11/20/2018 2.1 1.7 - 2.4 mg/dL Final    Comment:    Performed at [facility], [address] [place], [place] [address]         Passed - K in normal range and within 180 days    Potassium  Date Value Ref Range Status  06/09/2023 4.6 3.5 - 5.1 mmol/L Final  03/30/2014 3.7 3.5 - 5.1 mmol/L Final         Passed - Ca in normal range and within 180 days    Calcium   Date Value Ref Range Status  06/09/2023 9.7 8.9 - 10.3 mg/dL Final   Calcium , Total  Date Value Ref Range Status  03/30/2014 9.4 8.5 - 10.1 mg/dL Final   Calcium , Ion  Date Value Ref Range Status  03/07/2018 0.91 (L) 1.15 - 1.40 mmol/L Final         Passed - Na in normal range and within 180 days    Sodium  Date Value Ref Range Status  06/09/2023 137 135 - 145 mmol/L Final  06/27/2016 147 (H) 134 - 144 mmol/L Final  03/30/2014 135 (L) 136 - 145 mmol/L Final         Passed - Cr in normal range and within 180 days    Creat  Date Value Ref Range Status  05/19/2023 0.73 0.50 - 1.03 mg/dL Final   Creatinine, Ser  Date Value Ref Range Status  06/09/2023 0.78 0.44 - 1.00 mg/dL Final   Creatinine, Urine  Date Value Ref Range Status  06/30/2023 118 20 - 275 mg/dL Final         Passed - Cl in normal range and within 180 days     Chloride  Date Value Ref Range Status  06/09/2023 100 98 - 111 mmol/L Final  03/30/2014 105 98 - 107 mmol/L Final         Passed - Last BP in normal range    BP Readings from Last 1 Encounters:  10/10/23 124/82         Passed - Valid encounter within last 6 months    Recent Outpatient Visits           1 week ago Closed fracture of left side of maxilla with routine healing, subsequent encounter   [facility] Bernardo Fend, DO   1 month ago Type 2 diabetes mellitus with hyperglycemia, without long-term current use of insulin  [facility])   [facility] Bernardo Fend, DO   2 months ago Type 2 diabetes mellitus with hyperglycemia, without long-term current use of insulin  [facility])   [facility] Bernardo Fend, DO   2 months ago Primary hypertension   [facility] [place], [place], PA-C   3 months ago Type 2 diabetes mellitus with hyperglycemia, without long-term current use of insulin  [facility])   [facility] Bernardo Fend, DO       Future Appointments             In 1 week Bernardo Fend, DO [facility]   In 1 month Bernardo Fend, DO [facility]

## 2023-10-20 ENCOUNTER — Ambulatory Visit
Admission: RE | Admit: 2023-10-20 | Discharge: 2023-10-20 | Disposition: A | Discharge status: Home / Self Care | Payer: Medicaid Other | Source: Ambulatory Visit | Attending: Acute Care | Admitting: Acute Care

## 2023-10-20 ENCOUNTER — Ambulatory Visit: Payer: Medicaid Other

## 2023-10-20 DIAGNOSIS — Z87891 Personal history of nicotine dependence: Secondary | ICD-10-CM | POA: Insufficient documentation

## 2023-10-20 DIAGNOSIS — Z122 Encounter for screening for malignant neoplasm of respiratory organs: Secondary | ICD-10-CM | POA: Insufficient documentation

## 2023-10-20 NOTE — Telephone Encounter (Signed)
 Requested Prescriptions  Pending Prescriptions Disp Refills   lisinopril  (ZESTRIL ) 10 MG tablet [Pharmacy Med Name: Lisinopril  10 MG Oral Tablet] 30 tablet 0    Sig: Take 1 tablet by mouth once daily     Cardiovascular:  ACE Inhibitors Passed - 10/20/2023  8:20 AM      Passed - Cr in normal range and within 180 days    Creat  Date Value Ref Range Status  05/19/2023 0.73 0.50 - 1.03 mg/dL Final   Creatinine, Ser  Date Value Ref Range Status  06/09/2023 0.78 0.44 - 1.00 mg/dL Final   Creatinine, Urine  Date Value Ref Range Status  06/30/2023 118 20 - 275 mg/dL Final         Passed - K in normal range and within 180 days    Potassium  Date Value Ref Range Status  06/09/2023 4.6 3.5 - 5.1 mmol/L Final  03/30/2014 3.7 3.5 - 5.1 mmol/L Final         Passed - Patient is not pregnant      Passed - Last BP in normal range    BP Readings from Last 1 Encounters:  10/10/23 124/82         Passed - Valid encounter within last 6 months    Recent Outpatient Visits           1 week ago Closed fracture of left side of maxilla with routine healing, subsequent encounter   [facility] Rockney Cid, DO   1 month ago Type 2 diabetes mellitus with hyperglycemia, without long-term current use of insulin  [facility])   [facility] Rockney Cid, DO   2 months ago Type 2 diabetes mellitus with hyperglycemia, without long-term current use of insulin  [facility])   [facility] Rockney Cid, DO   2 months ago Primary hypertension   [facility] [place], [place], PA-C   3 months ago Type 2 diabetes mellitus with hyperglycemia, without long-term current use of insulin  [facility])   [facility] Rockney Cid, DO       Future Appointments             In 4 days Rock[facility] Cid, DO [facility]   In 1 month Rockney Cid, DO  [facility], [facility]

## 2023-10-22 DIAGNOSIS — J310 Chronic rhinitis: Secondary | ICD-10-CM | POA: Diagnosis not present

## 2023-10-24 ENCOUNTER — Ambulatory Visit: Payer: Medicaid Other | Admitting: Internal Medicine

## 2023-10-28 DIAGNOSIS — Z7689 Persons encountering health services in other specified circumstances: Secondary | ICD-10-CM | POA: Diagnosis not present

## 2023-10-28 DIAGNOSIS — G43719 Chronic migraine without aura, intractable, without status migrainosus: Secondary | ICD-10-CM | POA: Diagnosis not present

## 2023-11-03 ENCOUNTER — Other Ambulatory Visit: Payer: Self-pay

## 2023-11-03 ENCOUNTER — Ambulatory Visit: Payer: Self-pay | Admitting: Internal Medicine

## 2023-11-03 ENCOUNTER — Encounter: Payer: Self-pay | Admitting: Emergency Medicine

## 2023-11-03 DIAGNOSIS — Z122 Encounter for screening for malignant neoplasm of respiratory organs: Secondary | ICD-10-CM

## 2023-11-03 DIAGNOSIS — Z87891 Personal history of nicotine dependence: Secondary | ICD-10-CM

## 2023-11-03 NOTE — Telephone Encounter (Signed)
 Copied from CRM [id number]. Topic: Clinical - Red Word Triage >> Nov 03, 2023  8:18 AM Dondra Prader A wrote: Red Word that prompted transfer to Nurse Triage: Patient states that she is taking a new medicine that is making her itch real bad. She does not know the name of the medication because her friend does her bill box.   Chief Complaint: Possible reaction to medication Symptoms: itching one time (yesterday) on shoulders and arms yesterday Frequency: one time yesterday Pertinent Negatives: Patient denies itching, nausea, vomiting, difficulty breathing, chest pain Disposition: [] ED /[] Urgent Care (no appt availability in office) / [] Appointment(In office/virtual)/ []  [facility]/ [] Home Care/ [] Refused Recommended Disposition /[] [facility]/ [x]  Follow-up with PCP Additional Notes: Patient called and advised that she started a new medication at her PCP office 10/28/2023 and she takes it twice a day.  She states that yesterday she took it and started itching afterwards. Patient states that the medication was for nausea/vomiting. Patient states that she did not have any hives or whelps. Patient denies any itching at this time, chest pain, difficulty breathing, nausea, or vomiting.  Patient states that she did not take any benadryl with it and states that the itching was localized to her shoulders and arms yesterday.  Patient states that her neurologist Dr Joycie Peek is the one that prescribed her the medication.  She had been taking it and yesterday was the first time she had any symptoms.  She advised that she didn't have any other symptoms except for itching yesterday.  She also states that she wasn't outside, or had any new soaps or laundry detergents that she is aware of that may have caused the itching.  Patient is advised to contact the prescribing physician of the medication to discuss the itching that she experienced yesterday.  She is also advised that if anything symptoms  return or she feels worse at any point to go to the emergency room.  Patient verbalized understanding. Patient also states that she has one refill left on her Ozempic and the pharmacy needs her PCP (Dr Caralee Ates) to authorize this before they can fill it. She wanted to let her PCP know this as well.   Reason for Disposition  Taking prescription medication that could cause itching (e.g., codeine/morphine/other opiates, aspirin)  Answer Assessment - Initial Assessment Questions 1. DESCRIPTION: "Describe the itching you are having."     Itching on shoulders and arms with no hives or difficulty breathing 2. SEVERITY: "How bad is it?"    - MILD: Doesn't interfere with normal activities.   - MODERATE-SEVERE: Interferes with work, school, sleep, or other activities.      Mild--only lasted 20 to 30 minutes 3. SCRATCHING: "Are there any scratch marks? Bleeding?"     No 4. ONSET: "When did this begin?"      One episode yesterday 5. CAUSE: "What do you think is causing the itching?" (ask about swimming pools, pollen, animals, soaps, etc.)     Unknown 6. OTHER SYMPTOMS: "Do you have any other symptoms?"      No  Protocols used: Itching - [facility]

## 2023-11-03 NOTE — Telephone Encounter (Signed)
 Called pt back no answer unable to leave VM.   Calling pt to ensure if she had called the provider that prescribed medication for them to be aware

## 2023-11-03 NOTE — Telephone Encounter (Signed)
 Per RN note- Patient is advised to contact the prescribing physician of the medication to discuss the itching that she experienced yesterday

## 2023-11-08 DIAGNOSIS — Z419 Encounter for procedure for purposes other than remedying health state, unspecified: Secondary | ICD-10-CM | POA: Diagnosis not present

## 2023-11-14 ENCOUNTER — Telehealth: Payer: Self-pay

## 2023-11-14 ENCOUNTER — Ambulatory Visit: Payer: Self-pay | Admitting: Internal Medicine

## 2023-11-14 NOTE — Telephone Encounter (Signed)
 Spoke to patient and pharmacy a PA was sent

## 2023-11-14 NOTE — Telephone Encounter (Signed)
 Copied from CRM [id number]. Topic: Clinical - Medication Question >> Nov 14, 2023 10:55 AM Patsy Lager T wrote: Reason for CRM: patient called stated she need a prior auth for Semaglutide,0.25 or 0.5MG /DOS, (OZEMPIC, 0.25 OR 0.5 MG/DOSE,) 2 MG/3ML. Patient says she has been without her meds for over 2 weeks. Please f/u with patient   Disposition: [] ED /[] Urgent Care (no appt availability in office) / [] Appointment(In office/virtual)/ []  [facility]/ [] Home Care/ [] Refused Recommended Disposition /[]  Mobile Bus/ [x]  Follow-up with PCP Additional Notes: Contacted the patient at this time and was informed that the pharmacy contacted her stating they needed a prior authorization from the office. The patient additionally states she has been out of her medication for 2 weeks.

## 2023-11-14 NOTE — Telephone Encounter (Signed)
 Prior Auth from [employer] on  Semaglutide,0.25 or 0.5MG /DOS, (OZEMPIC, 0.25 OR 0.5 MG/DOSE,) 2 MG/3ML SOPN

## 2023-11-14 NOTE — Telephone Encounter (Signed)
 Patient stated she is on 0.5mg  a PA has been sent

## 2023-11-16 DIAGNOSIS — N200 Calculus of kidney: Secondary | ICD-10-CM | POA: Diagnosis not present

## 2023-11-16 DIAGNOSIS — K760 Fatty (change of) liver, not elsewhere classified: Secondary | ICD-10-CM | POA: Diagnosis not present

## 2023-11-16 DIAGNOSIS — N281 Cyst of kidney, acquired: Secondary | ICD-10-CM | POA: Diagnosis not present

## 2023-11-16 DIAGNOSIS — N201 Calculus of ureter: Secondary | ICD-10-CM | POA: Diagnosis not present

## 2023-11-19 ENCOUNTER — Other Ambulatory Visit: Payer: Self-pay | Admitting: Internal Medicine

## 2023-11-19 DIAGNOSIS — I10 Essential (primary) hypertension: Secondary | ICD-10-CM

## 2023-11-19 DIAGNOSIS — K219 Gastro-esophageal reflux disease without esophagitis: Secondary | ICD-10-CM

## 2023-11-19 DIAGNOSIS — R052 Subacute cough: Secondary | ICD-10-CM

## 2023-11-19 DIAGNOSIS — E1165 Type 2 diabetes mellitus with hyperglycemia: Secondary | ICD-10-CM

## 2023-11-19 DIAGNOSIS — M7989 Other specified soft tissue disorders: Secondary | ICD-10-CM

## 2023-11-20 ENCOUNTER — Telehealth: Payer: Self-pay | Admitting: Internal Medicine

## 2023-11-20 ENCOUNTER — Other Ambulatory Visit: Payer: Self-pay | Admitting: Internal Medicine

## 2023-11-20 DIAGNOSIS — E1165 Type 2 diabetes mellitus with hyperglycemia: Secondary | ICD-10-CM

## 2023-11-20 NOTE — Telephone Encounter (Signed)
 Patient called to ask questions about the prescription issue. She says [facility] will not refill any of her medications that were sent on yesterday because there are 2 insurances on file and [place] Medicaid will not pay due to the other insurance on file. She says she did not sign up for a new insurance and it's been on file since January. She is out of all of her medications and she is not able to pay out of pocket for any of them, so she doesn't know what to do. Advised I will send this to the provider to see if something can be done to assist in figuring out this problem.

## 2023-11-20 NOTE — Telephone Encounter (Signed)
 Requested Prescriptions  Pending Prescriptions Disp Refills   metFORMIN (GLUCOPHAGE-XR) 500 MG 24 hr tablet [Pharmacy Med Name: metFORMIN HCl ER 500 MG Oral Tablet Extended Release 24 Hour] 180 tablet 0    Sig: TAKE 1 TABLET BY MOUTH TWICE DAILY WITH A MEAL     Endocrinology:  Diabetes - Biguanides Failed - 11/20/2023  9:23 AM      Failed - HBA1C is between 0 and 7.9 and within 180 days    Hemoglobin A1C  Date Value Ref Range Status  08/01/2023 8.9 (A) 4.0 - 5.6 % Final   Hgb A1c MFr Bld  Date Value Ref Range Status  04/15/2023 7.2 (H) <5.7 % of total Hgb Final    Comment:    For someone without known diabetes, a hemoglobin A1c value of 6.5% or greater indicates that they may have  diabetes and this should be confirmed with a follow-up  test. . For someone with known diabetes, a value <7% indicates  that their diabetes is well controlled and a value  greater than or equal to 7% indicates suboptimal  control. A1c targets should be individualized based on  duration of diabetes, age, comorbid conditions, and  other considerations. . Currently, no consensus exists regarding use of hemoglobin A1c for diagnosis of diabetes for children. .          Passed - Cr in normal range and within 360 days    Creat  Date Value Ref Range Status  05/19/2023 0.73 0.50 - 1.03 mg/dL Final   Creatinine, Ser  Date Value Ref Range Status  06/09/2023 0.78 0.44 - 1.00 mg/dL Final   Creatinine, Urine  Date Value Ref Range Status  06/30/2023 118 20 - 275 mg/dL Final         Passed - eGFR in normal range and within 360 days    EGFR (African American)  Date Value Ref Range Status  03/30/2014 >60  Final   GFR calc Af Amer  Date Value Ref Range Status  04/20/2020 >60 >60 mL/min Final   EGFR (Non-African Amer.)  Date Value Ref Range Status  03/30/2014 >60  Final    Comment:    eGFR values <41mL/min/1.73 m2 may be an indication of chronic kidney disease (CKD). Calculated eGFR is useful in  patients with stable renal function. The eGFR calculation will not be reliable in acutely ill patients when serum creatinine is changing rapidly. It is not useful in  patients on dialysis. The eGFR calculation may not be applicable to patients at the low and high extremes of body sizes, pregnant women, and vegetarians.    GFR, Estimated  Date Value Ref Range Status  06/09/2023 >60 >60 mL/min Final    Comment:    (NOTE) Calculated using the CKD-EPI Creatinine Equation (2021)    eGFR  Date Value Ref Range Status  03/29/2022 83 > OR = 60 mL/min/1.69m2 Final    Comment:    The eGFR is based on the CKD-EPI 2021 equation. To calculate  the new eGFR from a previous Creatinine or Cystatin C result, go to https://www.kidney.org/professionals/ kdoqi/gfr%5Fcalculator          Passed - B12 Level in normal range and within 720 days    Vitamin B-12  Date Value Ref Range Status  02/20/2022 380 200 - 1,100 pg/mL Final    Comment:    . Please Note: Although the reference range for vitamin B12 is [phone] pg/mL, it has been reported that between 5 and 10% of patients  with values between 200 and 400 pg/mL may experience neuropsychiatric and hematologic abnormalities due to occult B12 deficiency; less than 1% of patients with values above 400 pg/mL will have symptoms. Verna Czech - Valid encounter within last 6 months    Recent Outpatient Visits           1 month ago Closed fracture of left side of maxilla with routine healing, subsequent encounter   [facility] Margarita Mail, DO   2 months ago Type 2 diabetes mellitus with hyperglycemia, without long-term current use of insulin [facility])   [place] [facility] Margarita Mail, DO   3 months ago Type 2 diabetes mellitus with hyperglycemia, without long-term current use of insulin [facility])   [place] [facility] Margarita Mail, DO   3 months ago Primary  hypertension   [place] [facility] [place], [place], PA-C   4 months ago Type 2 diabetes mellitus with hyperglycemia, without long-term current use of insulin [facility])   [place] [facility] Margarita Mail, DO       Future Appointments             In 1 week Margarita Mail, DO [place] [facility]            Passed - CBC within normal limits and completed in the last 12 months    WBC  Date Value Ref Range Status  06/09/2023 7.7 4.0 - 10.5 K/uL Final   RBC  Date Value Ref Range Status  06/09/2023 4.57 3.87 - 5.11 MIL/uL Final   Hemoglobin  Date Value Ref Range Status  06/09/2023 13.7 12.0 - 15.0 g/dL Final  16/06/9603 54.0 11.1 - 15.9 g/dL Final   HCT  Date Value Ref Range Status  06/09/2023 41.4 36.0 - 46.0 % Final   Hematocrit  Date Value Ref Range Status  06/27/2016 44.9 34.0 - 46.6 % Final   MCHC  Date Value Ref Range Status  06/09/2023 33.1 30.0 - 36.0 g/dL Final   [facility]  Date Value Ref Range Status  06/09/2023 30.0 26.0 - 34.0 pg Final   MCV  Date Value Ref Range Status  06/09/2023 90.6 80.0 - 100.0 fL Final  06/27/2016 95 79 - 97 fL Final  03/30/2014 94 80 - 100 fL Final   No results found for: "PLTCOUNTKUC", "LABPLAT", "POCPLA" RDW  Date Value Ref Range Status  06/09/2023 12.7 11.5 - 15.5 % Final  06/27/2016 13.9 12.3 - 15.4 % Final  03/30/2014 13.8 11.5 - 14.5 % Final          omeprazole (PRILOSEC) 40 MG capsule [Pharmacy Med Name: Omeprazole 40 MG Oral Capsule Delayed Release] 90 capsule 0    Sig: Take 1 capsule by mouth once daily     Gastroenterology: Proton Pump Inhibitors Passed - 11/20/2023  9:23 AM      Passed - Valid encounter within last 12 months    Recent Outpatient Visits           1 month ago Closed fracture of left side of maxilla with routine healing, subsequent encounter   [facility] Mail, DO   2 months ago Type 2  diabetes mellitus with hyperglycemia, without long-term current use of insulin [facility])   [place] [facility] Marg[facility]ita Mail, DO   3 months ago Type 2 diabetes mellitus with hyperglycemia, without long-term current use of  insulin [facility])   [place] [facility] Margarita Mail, DO   3 months ago Primary hypertension   [place] [facility] [place], [place]   4 months ago Type 2 diabetes mellitus with hyperglycemia, without long-term current use of insulin [facility])   [facility] Margarita Mail, DO       Future Appointments             In 1 week Margarita Mail, DO [place] [facility]             furosemide (LASIX) 40 MG tablet [Pharmacy Med Name: Furosemide 40 MG Oral Tablet] 30 tablet 0    Sig: TAKE 1 TABLET BY MOUTH TWICE DAILY AS NEEDED FOR  EDEMA  IN  THE  MORNING  (CAN  TAKE  ADDITIONAL  TABLET  IN  THE  AFTERNOON)     Cardiovascular:  Diuretics - Loop Failed - 11/20/2023  9:23 AM      Failed - Mg Level in normal range and within 180 days    Magnesium  Date Value Ref Range Status  11/20/2018 2.1 1.7 - 2.4 mg/dL Final    Comment:    Performed at [facility], [address] [place], [place] [address]         Passed - K in normal range and within 180 days    Potassium  Date Value Ref Range Status  06/09/2023 4.6 3.5 - 5.1 mmol/L Final  03/30/2014 3.7 3.5 - 5.1 mmol/L Final         Passed - Ca in normal range and within 180 days    Calcium  Date Value Ref Range Status  06/09/2023 9.7 8.9 - 10.3 mg/dL Final   Calcium, Total  Date Value Ref Range Status  03/30/2014 9.4 8.5 - 10.1 mg/dL Final   Calcium, Ion  Date Value Ref Range Status  03/07/2018 0.91 (L) 1.15 - 1.40 mmol/L Final         Passed - Na in normal range and within 180 days    Sodium  Date Value Ref Range Status  06/09/2023 137 135 - 145 mmol/L Final  06/27/2016 147 (H) 134 -  144 mmol/L Final  03/30/2014 135 (L) 136 - 145 mmol/L Final         Passed - Cr in normal range and within 180 days    Creat  Date Value Ref Range Status  05/19/2023 0.73 0.50 - 1.03 mg/dL Final   Creatinine, Ser  Date Value Ref Range Status  06/09/2023 0.78 0.44 - 1.00 mg/dL Final   Creatinine, Urine  Date Value Ref Range Status  06/30/2023 118 20 - 275 mg/dL Final         Passed - Cl in normal range and within 180 days    Chloride  Date Value Ref Range Status  06/09/2023 100 98 - 111 mmol/L Final  03/30/2014 105 98 - 107 mmol/L Final         Passed - Last BP in normal range    BP Readings from Last 1 Encounters:  10/10/23 124/82         Passed - Valid encounter within last 6 months    Recent Outpatient Visits           1 month ago Closed fracture of left side of maxilla with routine healing, subsequent encounter   [facility]   2 months ago Type 2  diabetes mellitus with hyperglycemia, without long-term current use of insulin [facility])   [place] [facility] Margarita Mail, DO   3 months ago Type 2 diabetes mellitus with hyperglycemia, without long-term current use of insulin [facility])   [place] [facility] Margarita Mail, DO   3 months ago Primary hypertension   [place] [facility] [place], [place], PA-C   4 months ago Type 2 diabetes mellitus with hyperglycemia, without long-term current use of insulin [facility])   [place] [facility] Margarita Mail, DO       Future Appointments             In 1 week Margarita Mail, DO [place] [facility], [facility]             amLODipine (NORVASC) 2.5 MG tablet [Pharmacy Med Name: amLODIPine Besylate 2.5 MG Oral Tablet] 90 tablet 0    Sig: Take 1 tablet by mouth once daily     Cardiovascular: Calcium Channel Blockers 2 Passed - 11/20/2023  9:23 AM      Passed - Last BP in normal  range    BP Readings from Last 1 Encounters:  10/10/23 124/82         Passed - Last Heart Rate in normal range    Pulse Readings from Last 1 Encounters:  10/10/23 100         Passed - Valid encounter within last 6 months    Recent Outpatient Visits           1 month ago Closed fracture of left side of maxilla with routine healing, subsequent encounter   [facility] Mail, DO   2 months ago Type 2 diabetes mellitus with hyperglycemia, without long-term current use of insulin [facility])   [place] [facility]ita Mail, DO   3 months ago Type 2 diabetes mellitus with hyperglycemia, without long-term current use of insulin [facility])   [place] [facility] Margarita Mail, DO   3 months ago Primary hypertension   [place] [facility] [place], [place], PA-C   4 months ago Type 2 diabetes mellitus with hyperglycemia, without long-term current use of insulin [facility])   [place] [facility] Margarita Mail, DO       Future Appointments             In 1 week Margarita Mail, DO [place]             benzonatate (TESSALON) 100 MG capsule [Pharmacy Med Name: Benzonatate 100 MG Oral Capsule] 20 capsule 0    Sig: TAKE 1 CAPSULE BY MOUTH TWICE DAILY AS NEEDED FOR COUGH     Ear, Nose, and Throat:  Antitussives/Expectorants Passed - 11/20/2023  9:23 AM      Passed - Valid encounter within last 12 months    Recent Outpatient Visits           1 month ago Closed fracture of left side of maxilla with routine healing, subsequent encounter   [facility]   2 months ago Type 2 diabetes mellitus with hyperglycemia, without long-term current use of insulin [facility])   [place] [facility] Marg[facility]ita Mail, DO   3 months ago Type 2 diabetes mellitus with hyperglycemia, without  long-term current use of insulin [facility])   [facility] Margarita Mail, DO   3 months ago Primary  hypertension   [place] [facility] [place], [place], [place]   4 months ago Type 2 diabetes mellitus with hyperglycemia, without long-term current use of insulin [facility])   [facility] Margarita Mail, DO       Future Appointments             In 1 week Margarita Mail, DO [place] [facility], [facility]             lisinopril (ZESTRIL) 10 MG tablet [Pharmacy Med Name: Lisinopril 10 MG Oral Tablet] 30 tablet 0    Sig: Take 1 tablet by mouth once daily     Cardiovascular:  ACE Inhibitors Passed - 11/20/2023  9:23 AM      Passed - Cr in normal range and within 180 days    Creat  Date Value Ref Range Status  05/19/2023 0.73 0.50 - 1.03 mg/dL Final   Creatinine, Ser  Date Value Ref Range Status  06/09/2023 0.78 0.44 - 1.00 mg/dL Final   Creatinine, Urine  Date Value Ref Range Status  06/30/2023 118 20 - 275 mg/dL Final         Passed - K in normal range and within 180 days    Potassium  Date Value Ref Range Status  06/09/2023 4.6 3.5 - 5.1 mmol/L Final  03/30/2014 3.7 3.5 - 5.1 mmol/L Final         Passed - Patient is not pregnant      Passed - Last BP in normal range    BP Readings from Last 1 Encounters:  10/10/23 124/82         Passed - Valid encounter within last 6 months    Recent Outpatient Visits           1 month ago Closed fracture of left side of maxilla with routine healing, subsequent encounter   [facility], DO   2 months ago Type 2 diabetes mellitus with hyperglycemia, without long-term current use of insulin [facility])   [place] [facility] Margarita Mail, DO   3 months ago Type 2 diabetes mellitus with hyperglycemia, without long-term current use of insulin [facility])   [place] [facility]  Margarita Mail, DO   3 months ago Primary hypertension   [place] [facility] [place], [place], PA-C   4 months ago Type 2 diabetes mellitus with hyperglycemia, without long-term current use of insulin [facility])   [facility] Margarita Mail, DO       Future Appointments             In 1 week Margarita Mail, DO [place] [facility]

## 2023-11-20 NOTE — Telephone Encounter (Unsigned)
 Copied from CRM [id number]. Topic: Clinical - Medication Refill >> Nov 20, 2023  4:25 PM Priscille Loveless wrote: Most Recent Primary Care Visit:  Provider: Margarita Mail  Department: [facility]  Visit Type: HOSPITAL FU  Date: 10/10/2023  Medication: Semaglutide,0.25 or 0.5MG /DOS, (OZEMPIC, 0.25 OR 0.5 MG/DOSE,) 2 MG/3ML   Has the patient contacted their pharmacy? Yes  Is this the correct pharmacy for this prescription? Yes  This is the patient's preferred pharmacy:   [facility] [address] [place] - [address] [place] [address] Phone: [phone] Fax: [phone]    Has the prescription been filled recently? Yes  Is the patient out of the medication? Yes  Has the patient been seen for an appointment in the last year OR does the patient have an upcoming appointment? Yes  Can we respond through MyChart? No  Agent: Please be advised that Rx refills may take up to 3 business days. We ask that you follow-up with your pharmacy.

## 2023-11-20 NOTE — Telephone Encounter (Signed)
 Copied from CRM [id number]. Topic: Clinical - Medication Question >> Nov 20, 2023  3:49 PM Tiffany S wrote: Reason for CRM: Patient called requesting call back about prescriptions. She has questions about the medication

## 2023-11-21 MED ORDER — OZEMPIC (0.25 OR 0.5 MG/DOSE) 2 MG/3ML ~~LOC~~ SOPN: 0.5000 mg | PEN_INJECTOR | SUBCUTANEOUS | 2 refills | Status: DC

## 2023-11-21 NOTE — Telephone Encounter (Signed)
 Requested Prescriptions  Pending Prescriptions Disp Refills   Semaglutide,0.25 or 0.5MG /DOS, (OZEMPIC, 0.25 OR 0.5 MG/DOSE,) 2 MG/3ML SOPN 3 mL 2    Sig: Inject 0.5 mg into the skin once a week.     Endocrinology:  Diabetes - GLP-1 Receptor Agonists - semaglutide Failed - 11/21/2023 12:28 PM      Failed - HBA1C in normal range and within 180 days    Hemoglobin A1C  Date Value Ref Range Status  08/01/2023 8.9 (A) 4.0 - 5.6 % Final   Hgb A1c MFr Bld  Date Value Ref Range Status  04/15/2023 7.2 (H) <5.7 % of total Hgb Final    Comment:    For someone without known diabetes, a hemoglobin A1c value of 6.5% or greater indicates that they may have  diabetes and this should be confirmed with a follow-up  test. . For someone with known diabetes, a value <7% indicates  that their diabetes is well controlled and a value  greater than or equal to 7% indicates suboptimal  control. A1c targets should be individualized based on  duration of diabetes, age, comorbid conditions, and  other considerations. . Currently, no consensus exists regarding use of hemoglobin A1c for diagnosis of diabetes for children. .          Passed - Cr in normal range and within 360 days    Creat  Date Value Ref Range Status  05/19/2023 0.73 0.50 - 1.03 mg/dL Final   Creatinine, Ser  Date Value Ref Range Status  06/09/2023 0.78 0.44 - 1.00 mg/dL Final   Creatinine, Urine  Date Value Ref Range Status  06/30/2023 118 20 - 275 mg/dL Final         Passed - Valid encounter within last 6 months    Recent Outpatient Visits           1 month ago Closed fracture of left side of maxilla with routine healing, subsequent encounter   [facility], DO   2 months ago Type 2 diabetes mellitus with hyperglycemia, without long-term current use of insulin [facility])   [place] [facility] Marg[facility]ita Mail, DO   3 months ago Type 2 diabetes mellitus with  hyperglycemia, without long-term current use of insulin [facility])   [place] [facility] Margarita Mail, DO   3 months ago Primary hypertension   [place] [facility] [place], [place], PA-C   4 months ago Type 2 diabetes mellitus with hyperglycemia, without long-term current use of insulin [facility])   [facility] Margarita Mail, DO       Future Appointments             In 1 week Margarita Mail, DO [facility]

## 2023-11-24 DIAGNOSIS — F319 Bipolar disorder, unspecified: Secondary | ICD-10-CM | POA: Diagnosis not present

## 2023-11-24 DIAGNOSIS — F410 Panic disorder [episodic paroxysmal anxiety] without agoraphobia: Secondary | ICD-10-CM | POA: Diagnosis not present

## 2023-11-24 DIAGNOSIS — F411 Generalized anxiety disorder: Secondary | ICD-10-CM | POA: Diagnosis not present

## 2023-11-24 DIAGNOSIS — F4312 Post-traumatic stress disorder, chronic: Secondary | ICD-10-CM | POA: Diagnosis not present

## 2023-11-24 NOTE — Telephone Encounter (Signed)
 Can we do a social work/pharmacy referral?

## 2023-11-25 ENCOUNTER — Other Ambulatory Visit: Payer: Self-pay | Admitting: Internal Medicine

## 2023-11-25 DIAGNOSIS — E1165 Type 2 diabetes mellitus with hyperglycemia: Secondary | ICD-10-CM

## 2023-11-25 DIAGNOSIS — Z5971 Insufficient health insurance coverage: Secondary | ICD-10-CM

## 2023-11-25 DIAGNOSIS — Z79899 Other long term (current) drug therapy: Secondary | ICD-10-CM

## 2023-11-25 DIAGNOSIS — I10 Essential (primary) hypertension: Secondary | ICD-10-CM

## 2023-11-25 NOTE — Telephone Encounter (Signed)
 Pt.notified

## 2023-11-27 ENCOUNTER — Ambulatory Visit: Payer: Self-pay

## 2023-11-27 ENCOUNTER — Ambulatory Visit: Admitting: Nurse Practitioner

## 2023-11-27 ENCOUNTER — Other Ambulatory Visit: Payer: Self-pay | Admitting: Internal Medicine

## 2023-11-27 DIAGNOSIS — J439 Emphysema, unspecified: Secondary | ICD-10-CM

## 2023-11-27 NOTE — Telephone Encounter (Signed)
 Copied from CRM [id number]. Topic: Clinical - Red Word Triage >> Nov 27, 2023  8:11 AM Truddie Crumble wrote: Red Word that prompted transfer to Nurse Triage: stomach pain and throwing up this morning  Chief Complaint: abd pain & cough Symptoms: abd pain upper abd/rib cage area, abd fullness, bloating & hard to touch,  Frequency: cough x 1week & abd pain x2 days Pertinent Negatives: Patient denies fever Disposition: [] ED /[] Urgent Care (no appt availability in office) / [x] Appointment(In office/virtual)/ []  [facility]/ [] Home Care/ [] Refused Recommended Disposition /[] [facility]/ []  Follow-up with PCP Additional Notes: stomach is hard and pt fulls bloated - PCP ordered CT of stomach    Reason for Disposition . [1] MILD-MODERATE pain AND [2] constant AND [3] present > 2 hours  Answer Assessment - Initial Assessment Questions 1. LOCATION: "Where does it hurt?"      Top of abd at rib cage 2. RADIATION: "Does the pain shoot anywhere else?" (e.g., chest, back)     no 3. ONSET: "When did the pain begin?" (e.g., minutes, hours or days ago)      X 2 days 4. SUDDEN: "Gradual or sudden onset?"     Gradual - sharp pain  5. PATTERN "Does the pain come and go, or is it constant?"    - If it comes and goes: "How long does it last?" "Do you have pain now?"     (Note: Comes and goes means the pain is intermittent. It goes away completely between bouts.)    - If constant: "Is it getting better, staying the same, or getting worse?"      (Note: Constant means the pain never goes away completely; most serious pain is constant and gets worse.)      constant 6. SEVERITY: "How bad is the pain?"  (e.g., Scale 1-10; mild, moderate, or severe)    - MILD (1-3): Doesn't interfere with normal activities, abdomen soft and not tender to touch.     - MODERATE (4-7): Interferes with normal activities or awakens from sleep, abdomen tender to touch.     - SEVERE (8-10): Excruciating pain, doubled  over, unable to do any normal activities.       moderate 7. RECURRENT SYMPTOM: "Have you ever had this type of stomach pain before?" If Yes, ask: "When was the last time?" and "What happened that time?"      Yes  8. CAUSE: "What do you think is causing the stomach pain?"     unkknown 9. RELIEVING/AGGRAVATING FACTORS: "What makes it better or worse?" (e.g., antacids, bending or twisting motion, bowel movement)     Bending and twisting increasing pain  10. OTHER SYMPTOMS: "Do you have any other symptoms?" (e.g., back pain, diarrhea, fever, urination pain, vomiting)       Back pain all time, some diarrhea 11. PREGNANCY: "Is there any chance you are pregnant?" "When was your last menstrual period?"       N/a  Reason for Disposition . [1] MILD difficulty breathing (e.g., minimal/no SOB at rest, SOB with walking, pulse <100) AND [2] still present when not coughing    Pt scheduled to be seen in office within 4 hours due to abd pain, bloating & ongoing cough.  Answer Assessment - Initial Assessment Questions 1. ONSET: "When did the cough begin?"      Ongoing  2. SEVERITY: "How bad is the cough today?"      Moderate to severe 3. SPUTUM: "Describe the color of your  sputum" (none, dry cough; clear, white, yellow, green)     greenish 4. HEMOPTYSIS: "Are you coughing up any blood?" If so ask: "How much?" (flecks, streaks, tablespoons, etc.)     no 5. DIFFICULTY BREATHING: "Are you having difficulty breathing?" If Yes, ask: "How bad is it?" (e.g., mild, moderate, severe)    - MILD: No SOB at rest, mild SOB with walking, speaks normally in sentences, can lie down, no retractions, pulse < 100.    - MODERATE: SOB at rest, SOB with minimal exertion and prefers to sit, cannot lie down flat, speaks in phrases, mild retractions, audible wheezing, pulse 100-120.    - SEVERE: Very SOB at rest, speaks in single words, struggling to breathe, sitting hunched forward, retractions, pulse > 120      no 6. FEVER:  "Do you have a fever?" If Yes, ask: "What is your temperature, how was it measured, and when did it start?"     no 7. CARDIAC HISTORY: "Do you have any history of heart disease?" (e.g., heart attack, congestive heart failure)      no 8. LUNG HISTORY: "Do you have any history of lung disease?"  (e.g., pulmonary embolus, asthma, emphysema)     N/A 9. PE RISK FACTORS: "Do you have a history of blood clots?" (or: recent major surgery, recent prolonged travel, bedridden)     NO 10. OTHER SYMPTOMS: "Do you have any other symptoms?" (e.g., runny nose, wheezing, chest pain)       NO 11. PREGNANCY: "Is there any chance you are pregnant?" "When was your last menstrual period?"       N/A 12. TRAVEL: "Have you traveled out of the country in the last month?" (e.g., travel history, exposures)       N/A  Protocols used: Cough - Acute Productive-A-AH, Cough - Acute Non-Productive-A-AH

## 2023-11-27 NOTE — Telephone Encounter (Signed)
 Pt had appt today at 11:20 and it is canceled? Say she is going to ER

## 2023-11-28 ENCOUNTER — Telehealth: Payer: Self-pay

## 2023-11-28 NOTE — Progress Notes (Signed)
 Complex Care Management Note  Care Guide Note 11/28/2023 Name: Stephanie [name] MRN: [id number] DOB: 08-01-1966  [name] is a 58 y.o. year old female who sees Margarita Mail, DO for primary care. I reached out to [name] by phone today to offer complex care management services.  [name] was given information about Complex Care Management services today including:   The Complex Care Management services include support from the care team which includes your Nurse Care Manager, Clinical Social Worker, or Pharmacist.  The Complex Care Management team is here to help remove barriers to the health concerns and goals most important to you. Complex Care Management services are voluntary, and the patient may decline or stop services at any time by request to their care team member.   Complex Care Management Consent Status: Patient agreed to services and verbal consent obtained.   Follow up plan:  Telephone appointment with complex care management team member scheduled for:  [facility] 12/09/2023 BSW 12/18/2023  Encounter Outcome:  Patient Scheduled  Penne Lash , RMA     [facility] Guide  Direct Dial: [phone]  Website: [email or web address]

## 2023-11-28 NOTE — Telephone Encounter (Signed)
 Requested Prescriptions  Pending Prescriptions Disp Refills   ADVAIR DISKUS 100-50 MCG/ACT AEPB [Pharmacy Med Name: Advair Diskus 100-50 MCG/DOSE Inhalation Aerosol Powder Breath Activated] 60 each 0    Sig: INHALE 1 DOSE BY MOUTH TWICE DAILY     Pulmonology:  Comb[employer] Passed - 11/28/2023  3:28 PM      Passed - Valid encounter within last 12 months    Recent Outpatient Visits           1 month ago Closed fracture of left side of maxilla with routine healing, subsequent encounter   [facility] Margarita Mail, DO   2 months ago Type 2 diabetes mellitus with hyperglycemia, without long-term current use of insulin [facility])   [facility] Margarita Mail, DO   3 months ago Type 2 diabetes mellitus with hyperglycemia, without long-term current use of insulin [facility])   [facility] Margarita Mail, DO   4 months ago Primary hypertension    [facility] [place], [place], PA-C   5 months ago Type 2 diabetes mellitus with hyperglycemia, without long-term current use of insulin [facility])   [facility] Margarita Mail, DO       Future Appointments             In 3 days Margarita Mail, DO [facility], [facility]

## 2023-12-01 ENCOUNTER — Ambulatory Visit: Payer: Medicaid Other | Admitting: Internal Medicine

## 2023-12-01 ENCOUNTER — Encounter: Payer: Self-pay | Admitting: Internal Medicine

## 2023-12-01 ENCOUNTER — Other Ambulatory Visit: Payer: Self-pay

## 2023-12-01 VITALS — BP 138/86 | HR 98 | Temp 98.2°F | Resp 18 | Ht 62.0 in | Wt 209.8 lb

## 2023-12-01 DIAGNOSIS — E1165 Type 2 diabetes mellitus with hyperglycemia: Secondary | ICD-10-CM | POA: Diagnosis not present

## 2023-12-01 DIAGNOSIS — Z7984 Long term (current) use of oral hypoglycemic drugs: Secondary | ICD-10-CM

## 2023-12-01 DIAGNOSIS — Z7985 Long-term (current) use of injectable non-insulin antidiabetic drugs: Secondary | ICD-10-CM | POA: Diagnosis not present

## 2023-12-01 DIAGNOSIS — Z7689 Persons encountering health services in other specified circumstances: Secondary | ICD-10-CM | POA: Diagnosis not present

## 2023-12-01 DIAGNOSIS — R112 Nausea with vomiting, unspecified: Secondary | ICD-10-CM

## 2023-12-01 DIAGNOSIS — R1031 Right lower quadrant pain: Secondary | ICD-10-CM

## 2023-12-01 LAB — POCT GLYCOSYLATED HEMOGLOBIN (HGB A1C): Hemoglobin A1C: 7.8 % — AB (ref 4.0–5.6)

## 2023-12-01 IMAGING — CR DG SHOULDER 2+V*L*
3 series · 3 of 3 positions shown · non-contrast
Comparison: 08/18/2021

CLINICAL DATA: Left shoulder pain

EXAM:
LEFT SHOULDER - 2+ VIEW

[x shoulder ap left (1 of 3)]
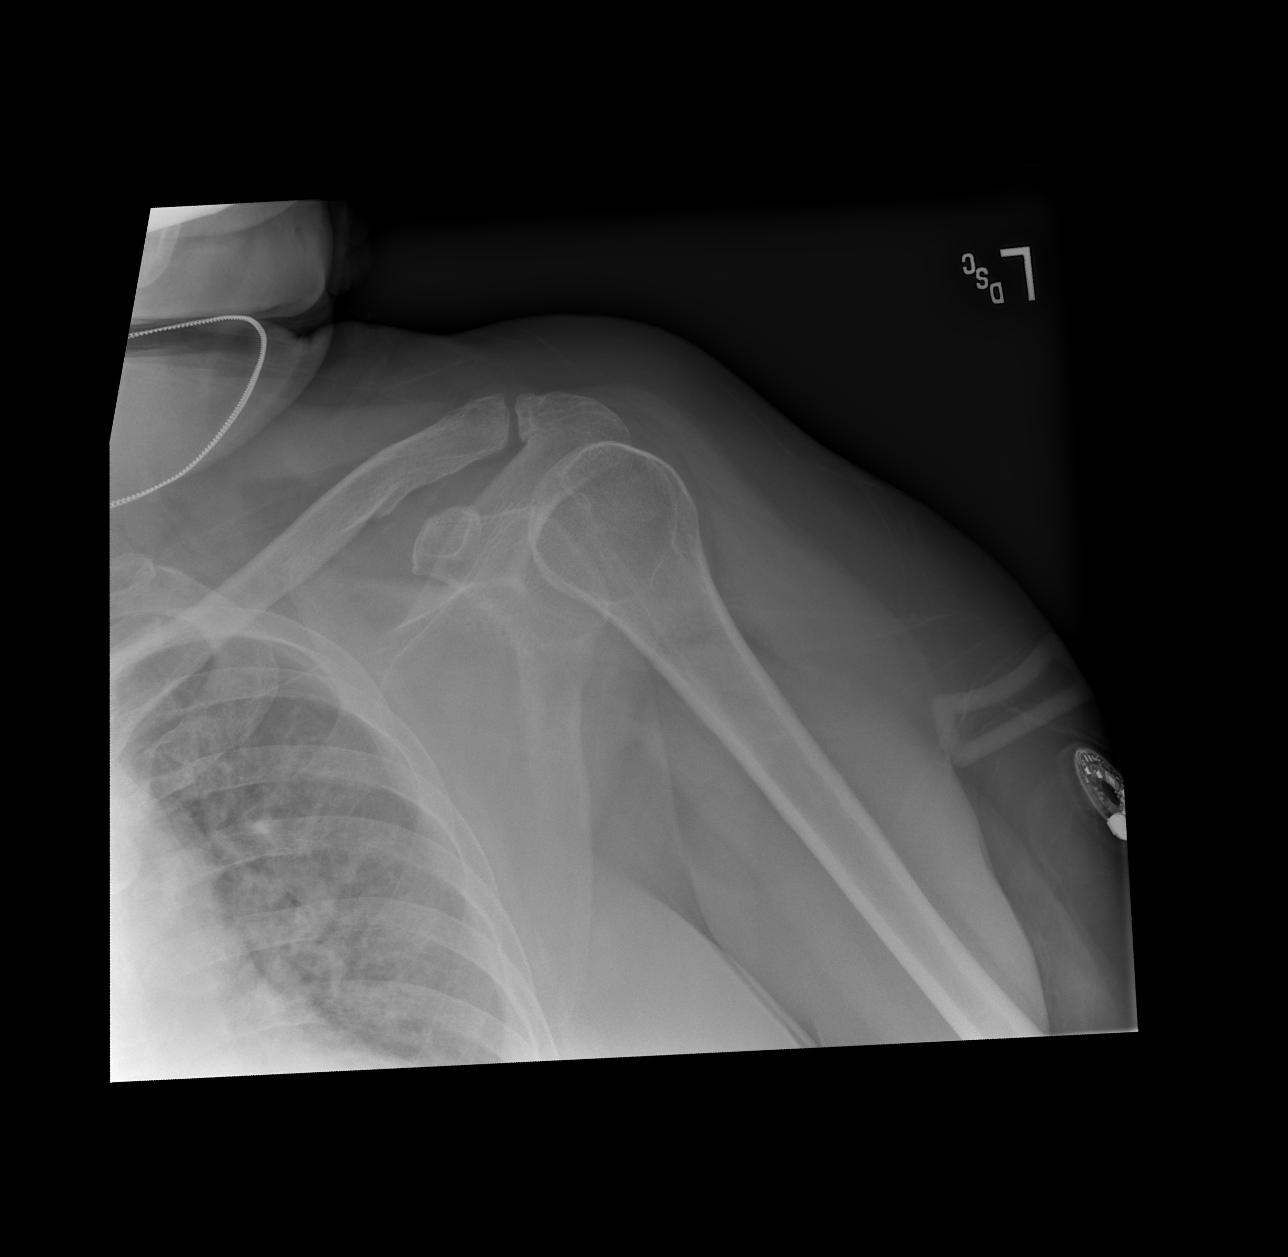

[x shoulder ap left (2 of 3)]
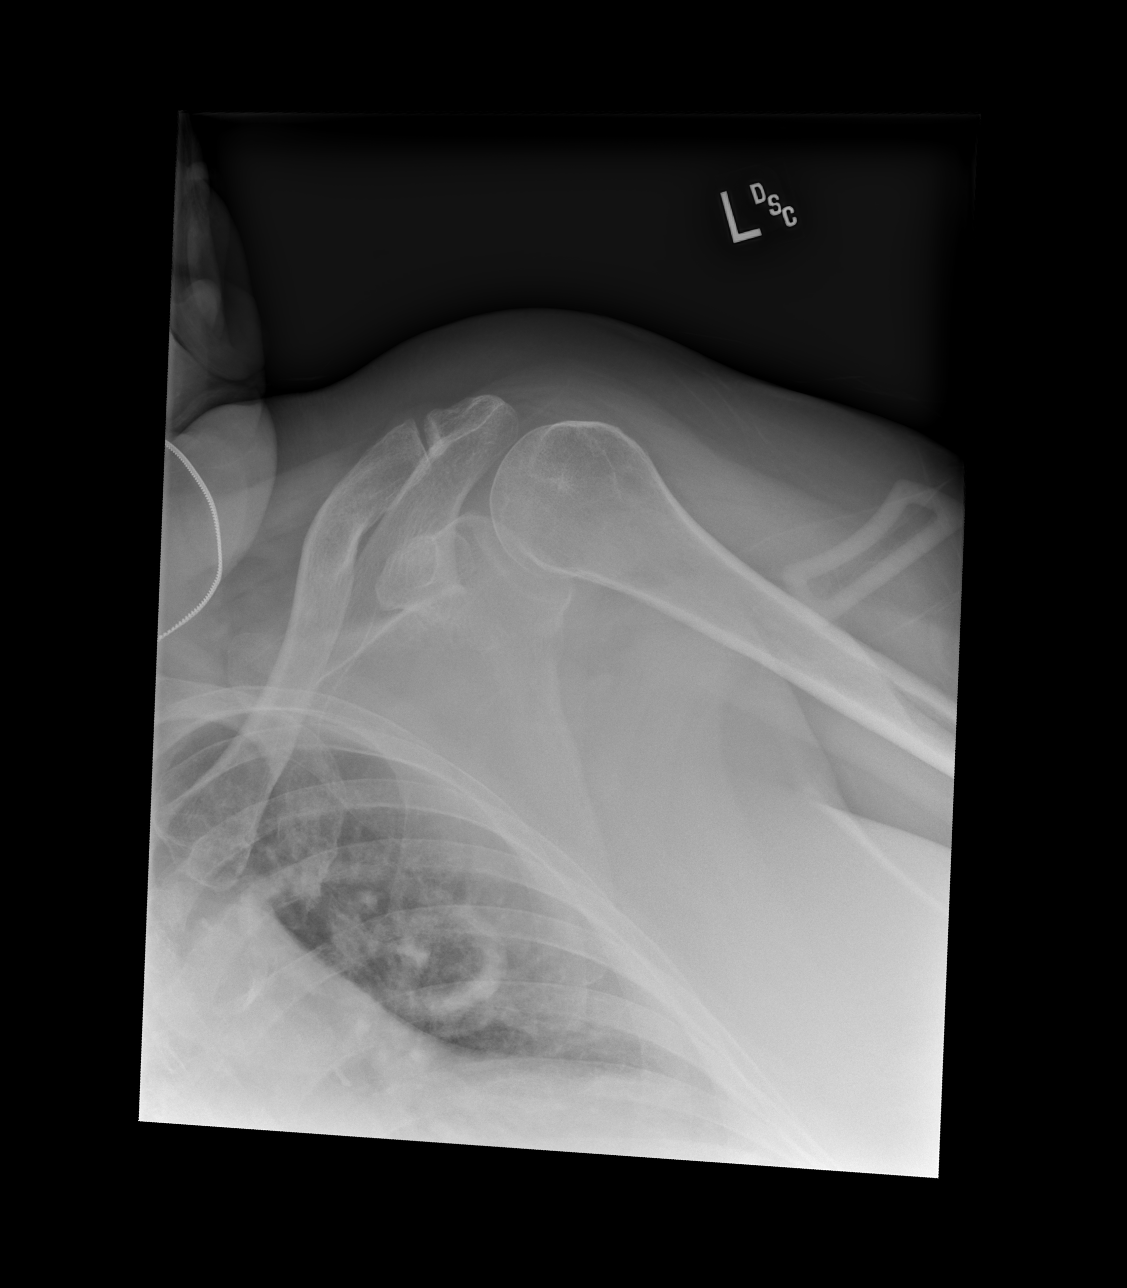

[x shoulder ap left (3 of 3)]
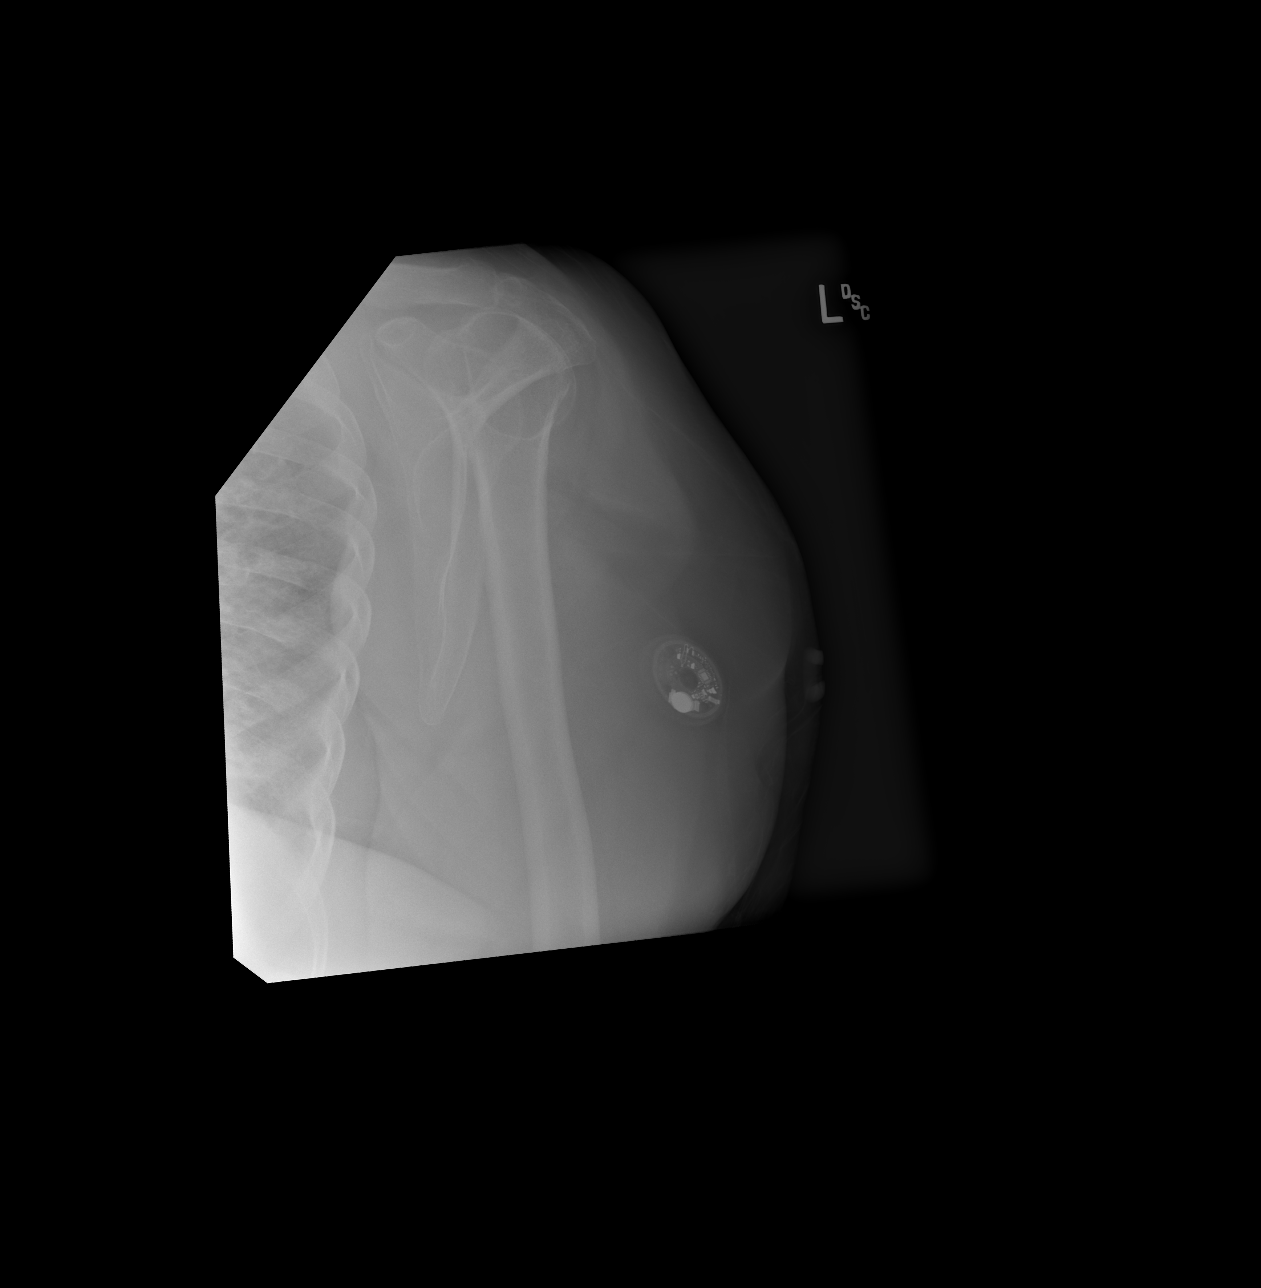

[3 of 3 positions shown; findings below may reference images not displayed]

FINDINGS: Mild AC joint degenerative change.  No fracture or malalignment
IMPRESSION: No acute osseous abnormality

## 2023-12-01 IMAGING — CT CT RENAL STONE PROTOCOL
2 of 4 series · 17 of 46 positions shown, 19 images · non-contrast
Comparison: 06/06/2020

CLINICAL DATA: RIGHT flank pain for 4-5 days



[Series 2: axial st · axial · 0.84mm/px · z∈[-887,-432]mm · 14 of 105 slices shown, 16 images]
[im 7/105  soft-tissue]
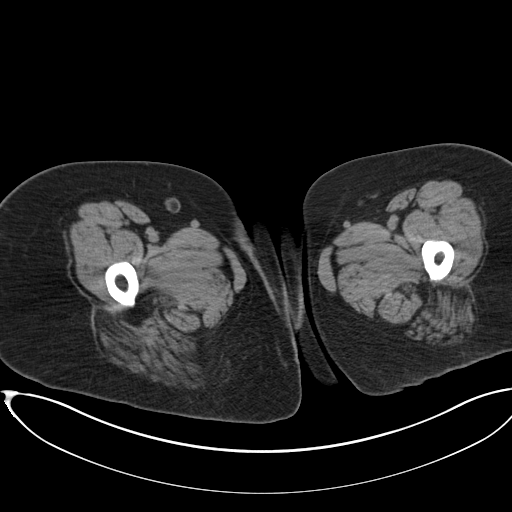
[im 7/105  bone]
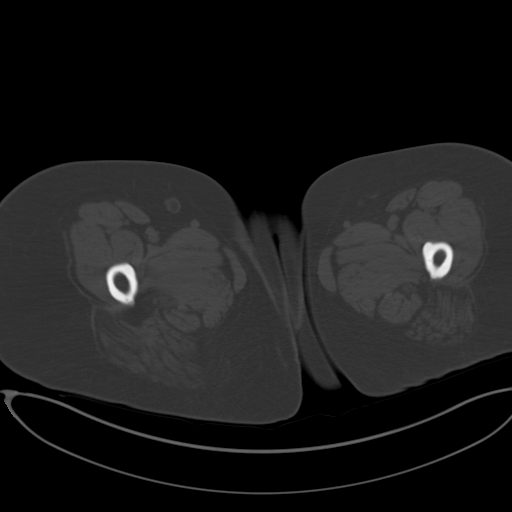
[im 14/105  soft-tissue]
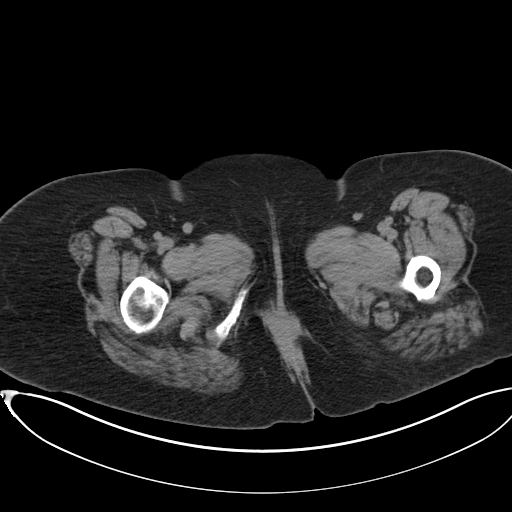
[im 20/105  soft-tissue]
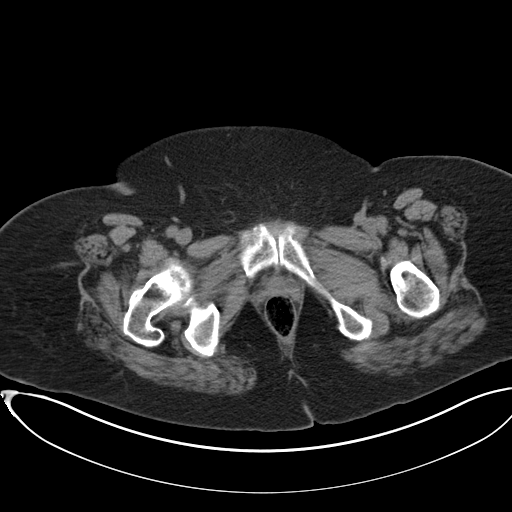
[im 27/105  soft-tissue]
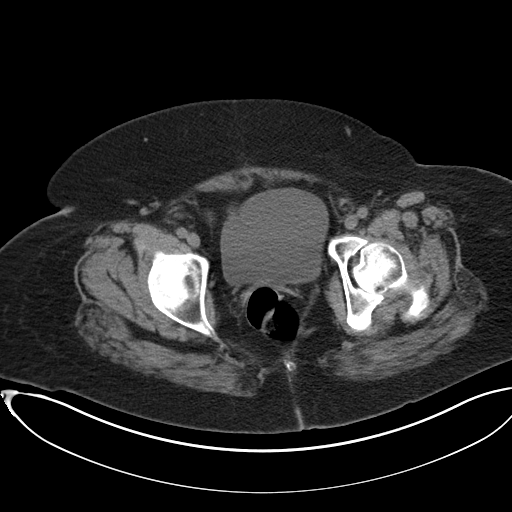
[im 33/105  soft-tissue]
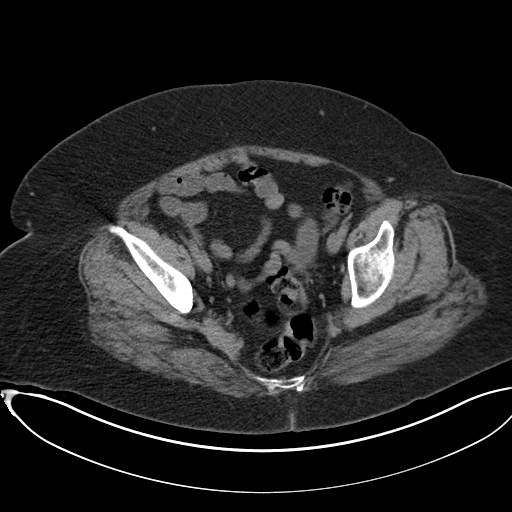
[im 40/105  soft-tissue]
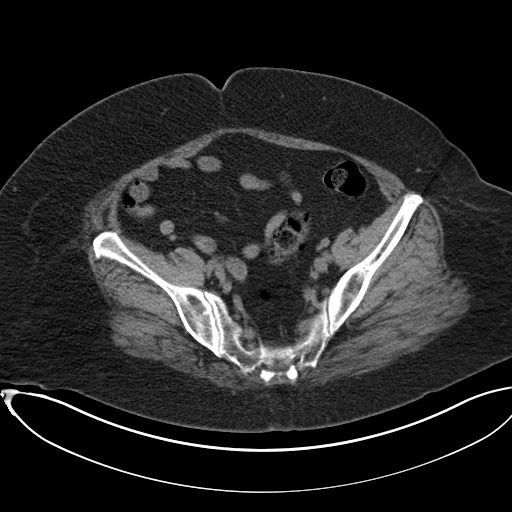
[im 46/105  soft-tissue]
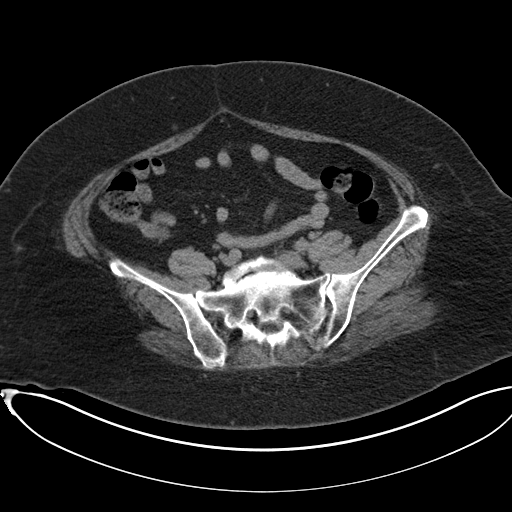
[im 59/105  soft-tissue]
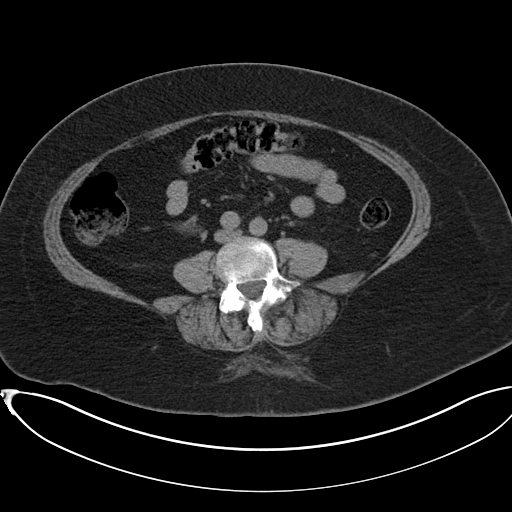
[im 66/105  soft-tissue]
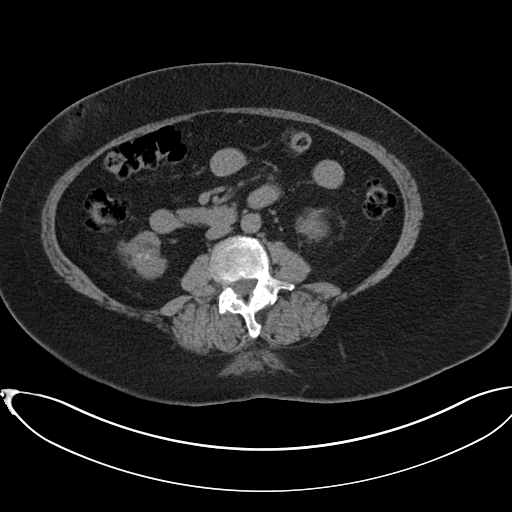
[im 66/105  bone]
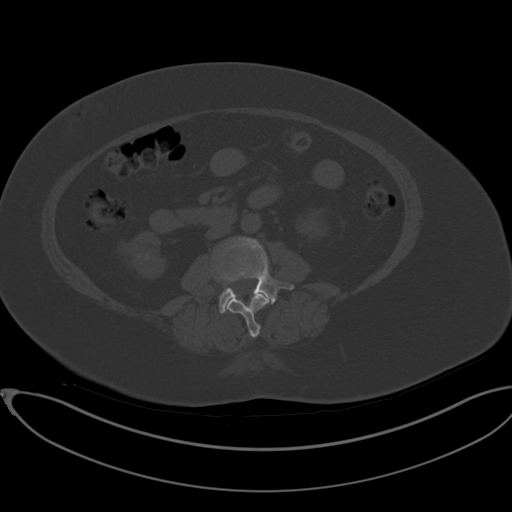
[im 72/105  soft-tissue]
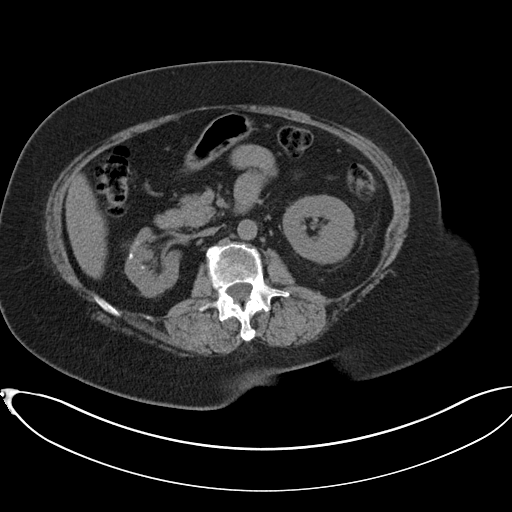
[im 79/105  soft-tissue]
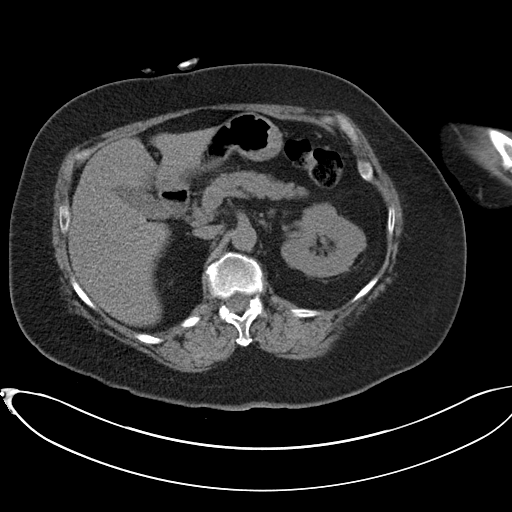
[im 85/105  soft-tissue]
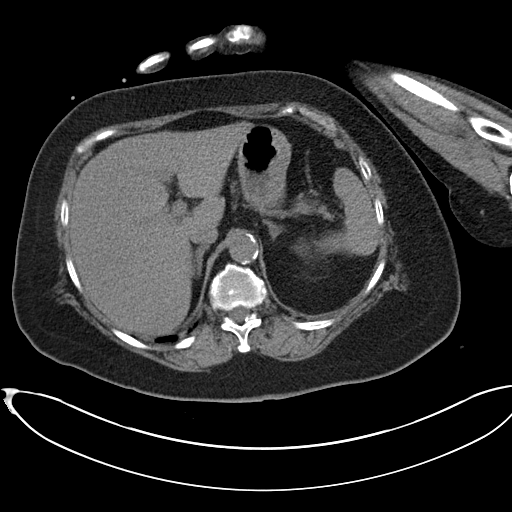
[im 92/105  soft-tissue]
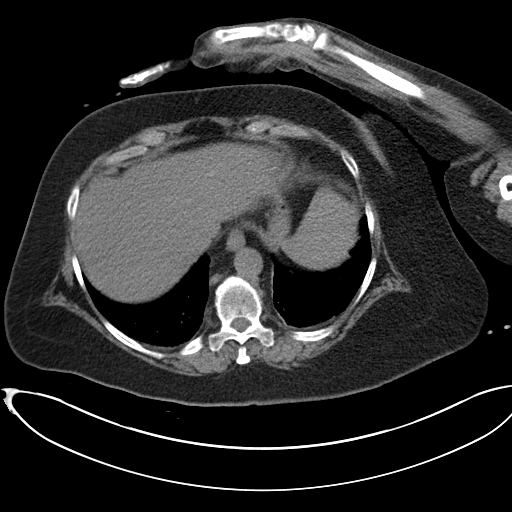
[im 98/105  soft-tissue]
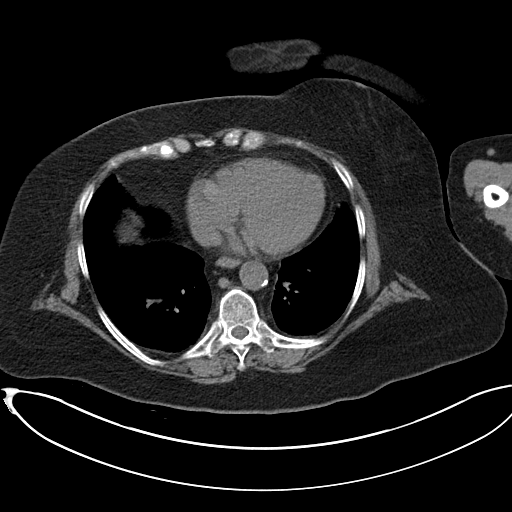

[Series 4: coronal · coronal · 0.99mm/px · 3 of 155 slices shown]
[im 52/155  soft-tissue]
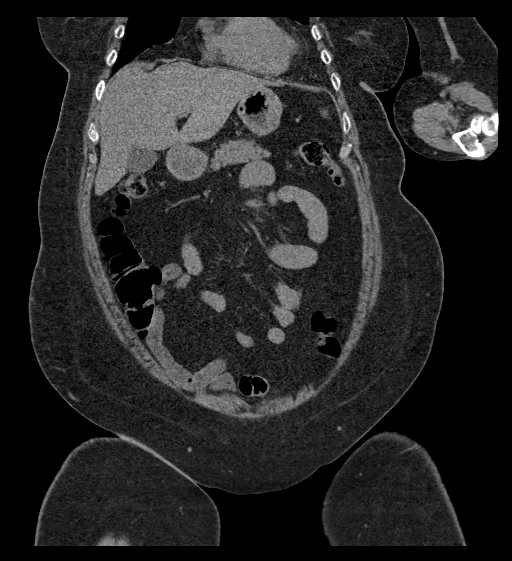
[im 69/155  soft-tissue]
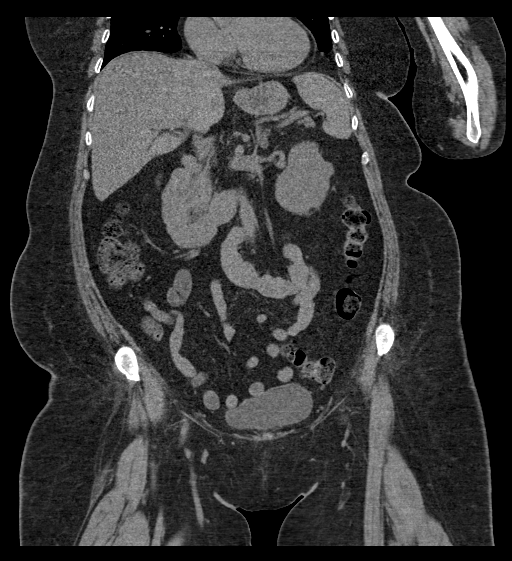
[im 86/155  soft-tissue]
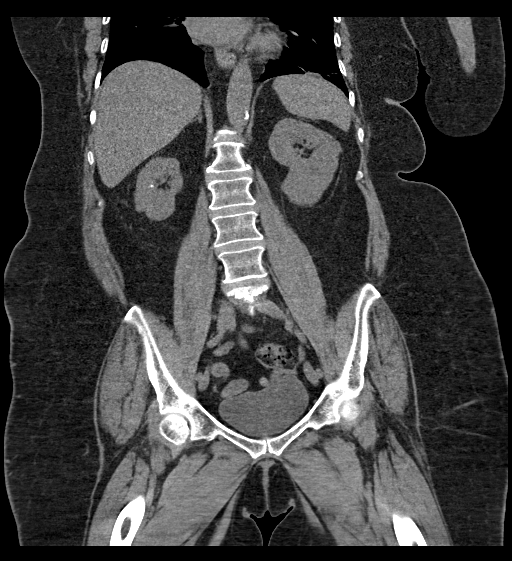

[17 of 46 positions shown; findings below may reference images not displayed]

FINDINGS: Lower chest: Minimal bibasilar atelectasis

Hepatobiliary: Gallbladder and liver normal appearance

Pancreas: Normal appearance

Spleen: Normal appearance

Adrenals/Urinary Tract: Adrenal glands normal appearance. BILATERAL
nonobstructing renal calculi. Cortical scarring RIGHT kidney. Small
medial RIGHT renal cyst. No additional renal masses or
hydronephrosis. No ureteral calcification or dilatation. Bladder
unremarkable.

Stomach/Bowel: Normal appendix. Stomach and bowel loops normal
appearance

Vascular/Lymphatic: Atherosclerotic calcifications aorta and iliac
arteries. Aorta normal caliber. No adenopathy.

Reproductive: Uterus surgically absent with nonvisualization of
ovaries.

Other: No free air or free fluid. No hernia or inflammatory process.

Musculoskeletal: Bones demineralized.
IMPRESSION: BILATERAL nonobstructing renal calculi.

Cortical scarring RIGHT kidney.

No acute intra-abdominal or intrapelvic abnormalities.

Aortic Atherosclerosis ([id number]).

## 2023-12-01 MED ORDER — PROMETHAZINE HCL 12.5 MG PO TABS: 12.5000 mg | ORAL_TABLET | Freq: Three times a day (TID) | ORAL | 0 refills | Status: AC | PRN

## 2023-12-01 MED ORDER — PROMETHAZINE HCL 25 MG PO TABS: 25.0000 mg | ORAL_TABLET | Freq: Once | ORAL | Status: DC

## 2023-12-01 MED ORDER — PROMETHAZINE HCL 25 MG/ML IJ SOLN
25.0000 mg | Freq: Once | INTRAMUSCULAR | Status: AC
Start: 2023-12-01 — End: 2023-12-01
  Administered 2023-12-01: 25 mg via INTRAMUSCULAR

## 2023-12-01 NOTE — Progress Notes (Addendum)
 Established Patient Office Visit  Subjective   [name]: Stephanie [name], female    DOB: 07-Feb-1966  Age: 58 y.o. MRN: [id number]  Chief Complaint  Patient presents with   Medical Management of Chronic Issues    Patient is here for follow up but has been having generalized abdominal pain x 2 week. Appears to be worse in the RLQ, severe. Will come and go, had this 6 months ago and resolved with Phengran and Miralax but today she states she's had it for about 5 days now and it is gradually getting worse. Taking Tylenol and Ibuprofen for pain. Nauseated, vomited multiple times on Thursday but none since that time. No constipation, diarrhea. BM's regular, had one soft BM this morning. Appetite decreased, has not eaten anything yet today. Patient states her belly feels "tight". No fevers, no urinary symptoms. Is on Ozempic but no recent dose changes, states she's had this pain before she was on the Ozempic.   Hypertension: -Medications: Lisinopril 10 mg, Amlodipine 2.5 mg, Lasix 40 mg - not taking Metoprolol  -Patient is compliant with above medications and reports no side effects. -Denies any SOB, CP, vision changes, LE edema or symptoms of hypotension -Diet: eating less calories  -Exercise: recently working out at [employer], lost 11 pounds  Diabetes, Type 2: -Last A1c 11/24 8.9% - had a medicare nurse check on 09/13/22 and it was 7.9% -Medications: Ozempic 0.5 mg, Metformin 500 mg BID, Pioglitazone 15 mg (had been on Glipizide in the past). Had been prescribed Actos but not currently taking. Tried to prescribe Rybelsus in the past but cost was too much.  -Patient is compliant with the above medications and reports no side effects. Doing well so far with the Ozempic, lost about 9 lbs so far -Checking BG at home: 230-300 -Eye exam: Due - patient will call to schedule -Foot exam: UTD -Microalbumin: UTD -Statin: uncertain if taking -PNA vaccine: UTD -Denies symptoms of hypoglycemia,  polyuria, polydipsia, numbness extremities, foot ulcers/trauma.  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   Bipolar disorder (HCC) 05/09/2016   Hyperlipidemia, unspecified 05/09/2016   Irritable bowel syndrome 05/09/2016   Lumbago with sciatica 05/09/2016   Esophageal reflux 05/09/2016   Scoliosis 05/09/2016   Diverticulosis of colon 05/09/2016   DDD (degenerative disc disease), cervical 05/09/2016   ICH (intracerebral hemorrhage) (HCC)    Primary hypertension 04/19/2016   Cytotoxic brain edema (HCC) 04/19/2016   IVH (intraventricular hemorrhage) (HCC) 04/18/2016   Bilateral carpal tunnel syndrome 04/12/2016   Falls frequently 01/12/2016   Trigeminal neuralgia 06/08/2015   Sacroiliac dysfunction 03/04/2014   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 or radiculopathy 05/20/2013   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   Tobacco use disorder 09/11/2012   History of cervical cancer 07/30/2012   Hepatitis C    Past Medical History:  Diagnosis Date   Closed fracture of shaft of humerus 01/20/2017   Diabetes mellitus without complication (HCC)    Difficult intubation    Diverticulitis    Headache    Hepatitis  C    treated and resolved   History of kidney stones    Hypertension    Hypotension 08/22/2018   IBS (irritable bowel syndrome)    Kidney stones    Sciatica    Sleep apnea    on cpap   Stroke [facility]) 2017   memory loss   Superficial venous thrombosis of arm, left 07/22/2016   Past  Surgical History:  Procedure Laterality Date   ABDOMINAL HYSTERECTOMY  2007   carpel tunnel syndrome  2017   HARDWARE REMOVAL Right 12/22/2017   Procedure: HARDWARE REMOVAL-RIGHT ARM;  Surgeon: Lyndle Herrlich, MD;  Location: [facility] ORS;  Service: Orthopedics;  Laterality: Right;  Removal of implant, superficial right humerus   HUMERUS IM NAIL Right 03/11/2017   Procedure: INTRAMEDULLARY (IM) NAIL HUMERAL;  Surgeon: Lyndle Herrlich, MD;  Location: [facility] ORS;  Service: Orthopedics;  Laterality: Right;   KIDNEY STONE SURGERY Right 02/12/2012   KIDNEY STONE SURGERY Left 07/15/2012   LIGATION OF ARTERIOVENOUS  FISTULA Left 06/21/2016   Procedure: LIGATION OF ARTERIOVENOUS  FISTULA ( LIGATION BASILIC VEIN );  Surgeon: Renford Dills, MD;  Location: [facility] ORS;  Service: Vascular;  Laterality: Left;   LITHOTRIPSY     OOPHORECTOMY     SINUS EXPLORATION     TONSILLECTOMY     Social History   Tobacco Use   Smoking status: Every Day    Current packs/day: 1.00    Average packs/day: 1 pack/day for 1.5 years (1.5 ttl pk-yrs)    Types: Cigarettes   Smokeless tobacco: Never  Vaping Use   Vaping status: Never Used  Substance Use Topics   Alcohol use: Not Currently    Comment: no alcohol since 2002   Drug use: No   Social History   Socioeconomic History   Marital status: Legally Separated    Spouse name: Not on file   Number of children: Not on file   Years of education: Not on file   Highest education level: Not on file  Occupational History   Not on file  Tobacco Use   Smoking status: Every Day    Current packs/day: 1.00    Average packs/day: 1 pack/day for 1.5 years (1.5 ttl pk-yrs)    Types: Cigarettes   Smokeless tobacco: Never  Vaping Use   Vaping status: Never Used  Substance and Sexual Activity   Alcohol use: Not Currently    Comment: no alcohol since 2002   Drug use: No   Sexual activity: Not on file  Other Topics Concern   Not on file  Social History Narrative   Not  on file   Social Drivers of Health   Financial Resource Strain: Medium Risk (05/12/2020)   Received from [facility]   Overall Financial Resource Strain (CARDIA)    Difficulty of Paying Living Expenses: Somewhat hard  Food Insecurity: No Food Insecurity (11/12/2022)   Hunger Vital Sign    Worried About Running Out of Food in the Last Year: Never true    Ran Out of Food in the Last Year: Never true  Transportation Needs: No Transportation Needs (03/17/2023)   PRAPARE - Administrator, Civil Service (Medical): No    Lack of Transportation (Non-Medical): No  Physical Activity: Not on file  Stress: Not on file  Social Connections: Not on file  Intimate Partner Violence: Not At Risk (03/17/2023)   Humiliation, Afraid, Rape, and Kick questionnaire    Fear of Current  or Ex-Partner: No    Emotionally Abused: No    Physically Abused: No    Sexually Abused: No   Family Status  Relation Name Status   Mother  Deceased   Father  Alive   Sister  Deceased  No partnership data on file   Family History  Problem Relation Age of Onset   Heart failure Mother    Diabetes Father    Cancer Sister    Allergies  Allergen Reactions   Gabapentin Itching   Ibuprofen Itching   Sulfa Antibiotics Hives   Tylenol [Acetaminophen] Itching   Aspirin Itching   Buprenorphine Hcl Itching   Carbamazepine Itching, Other (See Comments) and Rash    Makes her feel like something is crawling under her skin.   Codeine Itching and Other (See Comments)    Makes her feel like something is crawling under her skin. Can take, sometimes makes her itch   Compazine Itching and Other (See Comments)    "makes my skin crawl"   Elemental Sulfur Hives, Rash and Other (See Comments)    Skin Rashes, Hives   Ioxaglate Itching   Ivp Dye [Iodinated Contrast Media] Itching and Other (See Comments)    Makes her itch really bad. Had to get 2-3 shots of Benadryl when she was in the hospital.    Metrizamide Itching   Naproxen Itching   Norco [Hydrocodone-Acetaminophen] Itching   Other Itching and Other (See Comments)    Makes her itch really bad. Had to get 2-3 shots of Benadryl when she was in the hospital.   Penicillin G Hives, Rash and Other (See Comments)    Has patient had a PCN reaction causing immediate rash, facial/tongue/throat swelling, SOB or lightheadedness with hypotension: Yes Has patient had a PCN reaction causing severe rash involving mucus membranes or skin necrosis: No Has patient had a PCN reaction that required hospitalization: No Has patient had a PCN reaction occurring within the last 10 years: No If all of the above answers are "NO", then may proceed with Cephalosporin use.    Prochlorperazine Maleate Other (See Comments)    Compazine makes her skin crawl - needs 2-3 shots of Benadryl to get relief.   Reglan [Metoclopramide] Itching and Other (See Comments)    Makes her skin crawl   Toradol [Ketorolac Tromethamine] Itching   Tramadol Itching   Zofran [Ondansetron Hcl] Itching      Review of Systems  Constitutional:  Negative for chills and fever.  Gastrointestinal:  Positive for abdominal pain, nausea and vomiting. Negative for blood in stool, constipation, diarrhea, heartburn and melena.  Genitourinary:  Positive for urgency. Negative for dysuria, flank pain, frequency and hematuria.      Objective:     BP 138/86 (Cuff Size: Large)   Pulse 98   Temp 98.2 F (36.8 C) (Oral)   Resp 18   Ht 5\' 2"  (1.575 m)   Wt 209 lb 12.8 oz (95.2 kg)   SpO2 99%   BMI 38.37 kg/m  BP Readings from Last 3 Encounters:  12/01/23 138/86  10/10/23 124/82  09/16/23 128/82   Wt Readings from Last 3 Encounters:  12/01/23 209 lb 12.8 oz (95.2 kg)  10/10/23 214 lb 6.4 oz (97.3 kg)  09/16/23 216 lb 4.8 oz (98.1 kg)      Physical Exam Constitutional:      Appearance: Normal appearance.  HENT:     Head: Normocephalic and atraumatic.  Eyes:      Conjunctiva/sclera: Conjunctivae normal.  Cardiovascular:     Rate and Rhythm: Normal rate and regular rhythm.  Pulmonary:     Effort: Pulmonary effort is normal.     Breath sounds: Normal breath sounds.  Abdominal:     General: Bowel sounds are normal. There is no distension.     Palpations: Abdomen is soft. There is no mass.     Tenderness: There is abdominal tenderness. There is guarding. There is no rebound.     Hernia: No hernia is present.     Comments: Tenderness to palpation in every quadrant but worse in RLQ and epigastric regions, pain with laying down  Skin:    General: Skin is warm and dry.  Neurological:     General: No focal deficit present.     Mental Status: She is alert. Mental status is at baseline.  Psychiatric:        Mood and Affect: Mood normal.        Behavior: Behavior normal.      Results for orders placed or performed in visit on 12/01/23  POCT HgB A1C  Result Value Ref Range   Hemoglobin A1C 7.8 (A) 4.0 - 5.6 %   HbA1c POC (<> result, manual entry)     HbA1c, POC (prediabetic range)     HbA1c, POC (controlled diabetic range)      Last CBC Lab Results  Component Value Date   WBC 7.7 06/09/2023   HGB 13.7 06/09/2023   HCT 41.4 06/09/2023   MCV 90.6 06/09/2023   MCH 30.0 06/09/2023   RDW 12.7 06/09/2023   PLT 219 06/09/2023   Last metabolic panel Lab Results  Component Value Date   GLUCOSE 310 (H) 06/09/2023   NA 137 06/09/2023   K 4.6 06/09/2023   CL 100 06/09/2023   CO2 23 06/09/2023   BUN 11 06/09/2023   CREATININE 0.78 06/09/2023   GFRNONAA >60 06/09/2023   CALCIUM 9.7 06/09/2023   PHOS 2.3 (L) 08/22/2018   PROT 8.0 05/29/2023   ALBUMIN 4.2 05/29/2023   BILITOT 0.4 05/29/2023   ALKPHOS 98 05/29/2023   AST 27 05/29/2023   ALT 34 05/29/2023   ANIONGAP 14 06/09/2023   Last lipids Lab Results  Component Value Date   CHOL 123 02/13/2022   HDL 73 02/13/2022   LDLCALC 35 02/13/2022   TRIG 69 02/13/2022   CHOLHDL 1.7  02/13/2022   Last hemoglobin A1c Lab Results  Component Value Date   HGBA1C 7.8 (A) 12/01/2023   Last thyroid functions Lab Results  Component Value Date   TSH 1.218 04/16/2022   Last vitamin D No results found for: "25OHVITD2", "25OHVITD3", "VD25OH" Last vitamin B12 and Folate Lab Results  Component Value Date   VITAMINB12 380 02/20/2022      The ASCVD Risk score (Arnett DK, et al., 2019) failed to calculate for the following reasons:   Risk score cannot be calculated because patient has a medical history suggesting prior/existing ASCVD    Assessment & Plan:   1. Type 2 diabetes mellitus with hyperglycemia, without long-term current use of insulin (HCC) (Primary): A1c 7.8%, improving but will work up abdominal pain before changing medications.   - POCT HgB A1C  2. Right lower quadrant abdominal pain/Nausea and vomiting, unspecified vomiting type: Just had [facility] at [facility] on 11/28/23 without new findings, will send in refills of Phenergan and give her an injection of it today to help with acute nausea. Will order labs, she has an appointment with her GI tomorrow.  -  CBC w/Diff/Platelet - COMPLETE METABOLIC PANEL WITH GFR - Lipase - promethazine (PHENERGAN) 12.5 MG tablet; Take 1 tablet (12.5 mg total) by mouth every 8 (eight) hours as needed for nausea or vomiting.  Dispense: 20 tablet; Refill: 0 - promethazine (PHENERGAN) tablet 25 mg   Return in about 3 months (around 03/02/2024).    Margarita Mail, DO

## 2023-12-01 NOTE — Addendum Note (Signed)
 Addended by: Davene Costain on: 12/01/2023 02:38 PM   Modules accepted: Orders

## 2023-12-01 NOTE — Addendum Note (Signed)
 Addended by: Margarita Mail on: 12/01/2023 02:17 PM   Modules accepted: Orders

## 2023-12-03 DIAGNOSIS — R519 Headache, unspecified: Secondary | ICD-10-CM | POA: Diagnosis not present

## 2023-12-03 DIAGNOSIS — G8929 Other chronic pain: Secondary | ICD-10-CM | POA: Diagnosis not present

## 2023-12-04 ENCOUNTER — Ambulatory Visit: Payer: Self-pay | Admitting: Internal Medicine

## 2023-12-04 NOTE — Telephone Encounter (Signed)
  Chief Complaint: nausea/vomiting Symptoms: n/v/stomach pain   Disposition: [] ED /[] Urgent Care (no appt availability in office) / [x] Appointment(In office/virtual)/ []  [place] [facility]/ [] Home Care/ [] Refused Recommended Disposition /[]  Mobile Bus/ []  Follow-up with PCP Additional Notes: Pt complaining of nausea/vomiting for 2 days. Pt states the coughing makes it worse. Pt saw provider on 3/24 and was given nausea medication and pt states it is not helping. Pt saw GI two days ago but didn't mention these symptoms. Pt stated, "I call my PCP when I don't feel good." Pt states when she leans back she gets some relief. Pt declined office visit today due to transportation issues. Pt has appt for 3/28 @ 0820. RN gave care advice and pt verbalized understanding.            Copied from CRM [id number]. Topic: Clinical - Red Word Triage >> Dec 04, 2023  9:17 AM Carlatta H wrote: [facility] that prompted transfer to Nurse Triage: Patient has been coughing and vomiting for 2 days//Stomach pains as well for 2 days// Reason for Disposition  Nausea lasts > 1 week  Answer Assessment - Initial Assessment Questions 1. NAUSEA SEVERITY: "How bad is the nausea?" (e.g., mild, moderate, severe; dehydration, weight loss)   - MILD: loss of appetite without change in eating habits   - MODERATE: decreased oral intake without significant weight loss, dehydration, or malnutrition   - SEVERE: inadequate caloric or fluid intake, significant weight loss, symptoms of dehydration     Mild  2. ONSET: "When did the nausea begin?"     2 days ago 3. VOMITING: "Any vomiting?" If Yes, ask: "How many times today?" 3 5. CAUSE: "What do you think is causing the nausea?"     Not sure  Protocols used: Nausea-A-AH

## 2023-12-05 ENCOUNTER — Ambulatory Visit: Admitting: Internal Medicine

## 2023-12-08 ENCOUNTER — Telehealth: Payer: Self-pay

## 2023-12-08 NOTE — Telephone Encounter (Signed)
 Copied from CRM [id number]. Topic: Referral - Request for Referral >> Dec 08, 2023 10:17 AM Priscille Loveless wrote: Did the patient discuss referral with their provider in the last year? Yes   Appointment offered? No  Type of order/referral and detailed reason for visit: Pulmonary specialist in [facility]  Preference of office, provider, location: [facility]  If referral order, have you been seen by this specialty before? Yes [facility]  Can we respond through MyChart? NO

## 2023-12-09 ENCOUNTER — Ambulatory Visit: Payer: Self-pay

## 2023-12-09 NOTE — Patient Outreach (Unsigned)
 Care Coordination   Initial Visit Note   12/10/2023 Name: Stephanie [name] MRN: [id number] DOB: June 07, 1966  Stephanie [name] is a 58 y.o. year old female who sees Margarita Mail, DO for primary care. I spoke with  [name] by phone today.  What matters to the patients health and wellness today?  Patient reports having abdominal pain/ nausea and vomiting x 2 weeks. She states sometimes she coughs so bad she vomits. She states this comes and goes and reports having these symptoms approximately 4-6 months. Patient states she has not been able to eat much.  She reports having follow up visit with her provider on 12/01/23. She states her provider wants to do additional testing related to her stomach symptoms. She states she takes Ozempic however stomach symptoms started before this. Patient states the medication she's been prescribed for the symptoms are not really helping. Patient reports her most recent Hgb A1c was 7.8%.  She states her blood sugars have been elevated over the last few weeks getting as high as 330.  Patient states she has reported this to her provider. Patient states hasn't taken her Lamictal or Plavix for quite a while because she has been out of it. She states she doesn't recall mentioning this to her primary provider. She states she thinks the neurologist ordered these medications.  She states she is scheduled to see the neurologist on 12/16/23.    Goals Addressed             This Visit's Progress    Education and management of health conditions.       Interventions Today    Flowsheet Row Most Recent Value  Chronic Disease   Chronic disease during today's visit Diabetes, Other  [Right lower quadrant abdominal pain/Nausea and vomiting]  General Interventions   General Interventions Discussed/Reviewed General Interventions Discussed, Labs, Doctor Visits  [evaluation of current treatment plan for listed health conditions and patients adherence to plan as established by  providers]  Labs Hgb A1c every 3 months  [Discussed most recent Hgb A1c and goal.]  Doctor Visits Discussed/Reviewed Doctor Visits Discussed  [Discussed primary provider visit /plan of care from 12/01/23.  Reviewed upcoming provider visits. Advised to keep follow up visits with provider as recommended. Confirmed patient has transportation to provider visits.]  Education Interventions   Education Provided Provided Education  [Advised to continue using notebook to record appointments, provider/ health provider names to help with memory. Advised to notify provider for any new/ ongoing symptoms.  Call 911 for severe symptoms.]  Provided Verbal Education On Blood Sugar Monitoring, When to see the doctor, Other  [Discussed abdominal symptoms. Advised patient to have blood work completed at provider office as recommended. Assessed blood sugar readings. Confirmed patient has family support. Advised to notify provider of frequent blood sugars <70 or >250.]  Nutrition Interventions   Nutrition Discussed/Reviewed Nutrition Discussed  [Assess appetite. Advised not to eat spicey or greasy foods due to stomach symptom/nausea / vomiting.]  Pharmacy Interventions   Pharmacy Dicussed/Reviewed Pharmacy Topics Discussed  [Reviewed medications list. Discussed importance of medicaiton compliance. Advised patient to notify neurologist that she is not taking Plavix or lamictal. Message sent to primary care provider informing her patient not taking plavix/ lamictal.]  Advanced Directive Interventions   Advanced Directives Discussed/Reviewed Advanced Directives Discussed              SDOH assessments and interventions completed:  Yes  SDOH Interventions Today    Flowsheet Row Most Recent  Value  SDOH Interventions   Food Insecurity Interventions Intervention Not Indicated  Housing Interventions Intervention Not Indicated  Transportation Interventions Intervention Not Indicated  Utilities Interventions  Intervention Not Indicated        Care Coordination Interventions:  Yes, provided   Follow up plan: Follow up call scheduled for 12/25/23 at 10 am    Encounter Outcome:  Patient Visit Completed   George Ina RN, BSN, CCM [place]  [facility], Population Health Case Manager Phone: [phone]

## 2023-12-09 NOTE — Telephone Encounter (Signed)
 Chief Complaint: cough Symptoms: cough, vomiting, abd pain Frequency: x3-4 Pertinent Negatives: Patient denies fever Disposition: [] ED /[] Urgent Care (no appt availability in office) / [x] Appointment(In office/virtual)/ []  [place] [facility]/ [] Home Care/ [] Refused Recommended Disposition /[] [place] [facility]/ []  Follow-up with PCP Additional Notes: pt states that she developed a cough about 3-4 days ago and this morning after drinking coffee she started coughing and vomited up her coffee.  Patient states that abd pain is 8/10 but she hasn't taking anything for it and its only when she coughs. States she took vicks vapor cool and it did help her get some sleep.   Copied from CRM [id number]. Topic: Clinical - Red Word Triage >> Dec 09, 2023  3:29 PM DeAngela L wrote: Red Word that prompted transfer to Nurse Triage: Patient states she is not feeling well and have been vomiting and coughing since last Wednesday hurts between the ribs Reason for Disposition  SEVERE coughing spells (e.g., whooping sound after coughing, vomiting after coughing)  Answer Assessment - Initial Assessment Questions 1. ONSET: "When did the cough begin?"      3-4 days 2. SEVERITY: "How bad is the cough today?"      severe 3. SPUTUM: "Describe the color of your sputum" (none, dry cough; clear, white, yellow, green)     no 4. HEMOPTYSIS: "Are you coughing up any blood?" If so ask: "How much?" (flecks, streaks, tablespoons, etc.)     no 5. DIFFICULTY BREATHING: "Are you having difficulty breathing?" If Yes, ask: "How bad is it?" (e.g., mild, moderate, severe)    - MILD: No SOB at rest, mild SOB with walking, speaks normally in sentences, can lie down, no retractions, pulse < 100.    - MODERATE: SOB at rest, SOB with minimal exertion and prefers to sit, cannot lie down flat, speaks in phrases, mild retractions, audible wheezing, pulse 100-120.    - SEVERE: Very SOB at rest, speaks in single words, struggling to  breathe, sitting hunched forward, retractions, pulse > 120      mild 6. FEVER: "Do you have a fever?" If Yes, ask: "What is your temperature, how was it measured, and when did it start?"     no 7. CARDIAC HISTORY: "Do you have any history of heart disease?" (e.g., heart attack, congestive heart failure)      no 8. LUNG HISTORY: "Do you have any history of lung disease?"  (e.g., pulmonary embolus, asthma, emphysema)     copd 9. PE RISK FACTORS: "Do you have a history of blood clots?" (or: recent major surgery, recent prolonged travel, bedridden)     no 10. OTHER SYMPTOMS: "Do you have any other symptoms?" (e.g., runny nose, wheezing, chest pain)       Vomiting, palpations, abd pain  Protocols used: Cough - Acute Productive-A-AH

## 2023-12-10 ENCOUNTER — Ambulatory Visit: Admitting: Internal Medicine

## 2023-12-10 NOTE — Patient Instructions (Signed)
 Visit Information  Thank you for taking time to visit with me today. Please don't hesitate to contact me if I can be of assistance to you.   Following are the goals we discussed today:   Goals Addressed             This Visit's Progress    Education and management of health conditions.       Interventions Today    Flowsheet Row Most Recent Value  Chronic Disease   Chronic disease during today's visit Diabetes, Other  [Right lower quadrant abdominal pain/Nausea and vomiting]  General Interventions   General Interventions Discussed/Reviewed General Interventions Discussed, Labs, Doctor Visits  [evaluation of current treatment plan for listed health conditions and patients adherence to plan as established by providers]  Labs Hgb A1c every 3 months  [Discussed most recent Hgb A1c and goal.]  Doctor Visits Discussed/Reviewed Doctor Visits Discussed  [Discussed primary provider visit /plan of care from 12/01/23.  Reviewed upcoming provider visits. Advised to keep follow up visits with provider as recommended. Confirmed patient has transportation to provider visits.]  Education Interventions   Education Provided Provided Education  [Advised to continue using notebook to record appointments, provider/ health provider names to help with memory. Advised to notify provider for any new/ ongoing symptoms.  Call 911 for severe symptoms.]  Provided Verbal Education On Blood Sugar Monitoring, When to see the doctor, Other  [Discussed abdominal symptoms. Advised patient to have blood work completed at provider office as recommended. Assessed blood sugar readings. Confirmed patient has family support. Advised to notify provider of frequent blood sugars <70 or >250.]  Nutrition Interventions   Nutrition Discussed/Reviewed Nutrition Discussed  [Assess appetite. Advised not to eat spicey or greasy foods due to stomach symptom/nausea / vomiting.]  Pharmacy Interventions   Pharmacy Dicussed/Reviewed Pharmacy  Topics Discussed  [Reviewed medications list. Discussed importance of medicaiton compliance. Advised patient to notify neurologist that she is not taking Plavix or lamictal. Message sent to primary care provider informing her patient not taking plavix/ lamictal.]  Advanced Directive Interventions   Advanced Directives Discussed/Reviewed Advanced Directives Discussed              Our next appointment is by telephone on 12/25/23 at 10 am  Please call the care guide team at [phone] if you need to cancel or reschedule your appointment.   If you are experiencing a Mental Health or Behavioral Health Crisis or need someone to talk to, please call the Suicide and Crisis Lifeline: 988 call 1-800-273-TALK (toll free, 24 hour hotline)  Patient verbalizes understanding of instructions and care plan provided today and agrees to view in MyChart. Active MyChart status and patient understanding of how to access instructions and care plan via MyChart confirmed with patient.     George Ina RN, BSN, CCM [employer], Population Health Case Manager Phone: [phone]

## 2023-12-10 NOTE — Telephone Encounter (Signed)
 Left message for patient for patient to call with  reason

## 2023-12-11 ENCOUNTER — Ambulatory Visit: Admitting: Internal Medicine

## 2023-12-11 ENCOUNTER — Other Ambulatory Visit: Payer: Self-pay | Admitting: Internal Medicine

## 2023-12-11 DIAGNOSIS — I10 Essential (primary) hypertension: Secondary | ICD-10-CM

## 2023-12-11 DIAGNOSIS — R052 Subacute cough: Secondary | ICD-10-CM

## 2023-12-11 DIAGNOSIS — E876 Hypokalemia: Secondary | ICD-10-CM

## 2023-12-11 DIAGNOSIS — M7989 Other specified soft tissue disorders: Secondary | ICD-10-CM

## 2023-12-12 NOTE — Telephone Encounter (Signed)
 Requested Prescriptions  Pending Prescriptions Disp Refills   benzonatate (TESSALON) 100 MG capsule [Pharmacy Med Name: Benzonatate 100 MG Oral Capsule] 20 capsule 0    Sig: TAKE 1 CAPSULE BY MOUTH TWICE DAILY AS NEEDED FOR COUGH     Ear, Nose, and Throat:  Antitussives/Expectorants Passed - 12/12/2023 10:38 AM      Passed - Valid encounter within last 12 months    Recent Outpatient Visits           1 week ago Type 2 diabetes mellitus with hyperglycemia, without long-term current use of insulin (HCC)   [place] [facility] Margarita Mail, DO       Future Appointments             In 2 months Margarita Mail, DO [place] [facility], [facility]             furosemide (LASIX) 40 MG tablet [Pharmacy Med Name: Furosemide 40 MG Oral Tablet] 30 tablet 0    Sig: TAKE 1 TABLET BY MOUTH TWICE DAILY AS NEEDED FOR  EDEMA  (CAN  TAKE  AN  ADDITIONAL  TABLET  IN  THE  AFTERNOON)     Cardiovascular:  Diuretics - Loop Failed - 12/12/2023 10:38 AM      Failed - K in normal range and within 180 days    Potassium  Date Value Ref Range Status  06/09/2023 4.6 3.5 - 5.1 mmol/L Final  03/30/2014 3.7 3.5 - 5.1 mmol/L Final         Failed - Ca in normal range and within 180 days    Calcium  Date Value Ref Range Status  06/09/2023 9.7 8.9 - 10.3 mg/dL Final   Calcium, Total  Date Value Ref Range Status  03/30/2014 9.4 8.5 - 10.1 mg/dL Final   Calcium, Ion  Date Value Ref Range Status  03/07/2018 0.91 (L) 1.15 - 1.40 mmol/L Final         Failed - Na in normal range and within 180 days    Sodium  Date Value Ref Range Status  06/09/2023 137 135 - 145 mmol/L Final  06/27/2016 147 (H) 134 - 144 mmol/L Final  03/30/2014 135 (L) 136 - 145 mmol/L Final         Failed - Cr in normal range and within 180 days    Creat  Date Value Ref Range Status  05/19/2023 0.73 0.50 - 1.03 mg/dL Final   Creatinine, Ser  Date Value Ref Range Status  06/09/2023 0.78 0.44  - 1.00 mg/dL Final   Creatinine, Urine  Date Value Ref Range Status  06/30/2023 118 20 - 275 mg/dL Final         Failed - Cl in normal range and within 180 days    Chloride  Date Value Ref Range Status  06/09/2023 100 98 - 111 mmol/L Final  03/30/2014 105 98 - 107 mmol/L Final         Failed - Mg Level in normal range and within 180 days    Magnesium  Date Value Ref Range Status  11/20/2018 2.1 1.7 - 2.4 mg/dL Final    Comment:    Performed at [facility], [address] [place], [place] [address]         Passed - Last BP in normal range    BP Readings from Last 1 Encounters:  12/01/23 138/86         Passed - Valid encounter within last 6 months  Recent Outpatient Visits           1 week ago Type 2 diabetes mellitus with hyperglycemia, without long-term current use of insulin [facility])   [place] [facility] Margarita Mail, DO       Future Appointments             In 2 months Margarita Mail, DO [place] [facility], [facility]             KLOR-CON M20 20 MEQ tablet [Pharmacy Med Name: Klor-Con M20 20 MEQ Oral Tablet Extended Release] 90 tablet 0    Sig: TAKE 1 TABLET BY MOUTH TWICE DAILY FOR 3 DAYS,THEN 1 TABLET ONCE DAILY WITH LASIX     Endocrinology:  Minerals - Potassium Supplementation Passed - 12/12/2023 10:38 AM      Passed - K in normal range and within 360 days    Potassium  Date Value Ref Range Status  06/09/2023 4.6 3.5 - 5.1 mmol/L Final  03/30/2014 3.7 3.5 - 5.1 mmol/L Final         Passed - Cr in normal range and within 360 days    Creat  Date Value Ref Range Status  05/19/2023 0.73 0.50 - 1.03 mg/dL Final   Creatinine, Ser  Date Value Ref Range Status  06/09/2023 0.78 0.44 - 1.00 mg/dL Final   Creatinine, Urine  Date Value Ref Range Status  06/30/2023 118 20 - 275 mg/dL Final         Passed - Valid encounter within last 12 months    Recent Outpatient Visits           1 week ago Type  2 diabetes mellitus with hyperglycemia, without long-term current use of insulin (HCC)   [place] [facility] Margarita Mail, DO       Future Appointments             In 2 months Margarita Mail, DO [place] [facility], [facility]             lisinopril (ZESTRIL) 10 MG tablet [Pharmacy Med Name: Lisinopril 10 MG Oral Tablet] 30 tablet 0    Sig: Take 1 tablet by mouth once daily     Cardiovascular:  ACE Inhibitors Failed - 12/12/2023 10:38 AM      Failed - Cr in normal range and within 180 days    Creat  Date Value Ref Range Status  05/19/2023 0.73 0.50 - 1.03 mg/dL Final   Creatinine, Ser  Date Value Ref Range Status  06/09/2023 0.78 0.44 - 1.00 mg/dL Final   Creatinine, Urine  Date Value Ref Range Status  06/30/2023 118 20 - 275 mg/dL Final         Failed - K in normal range and within 180 days    Potassium  Date Value Ref Range Status  06/09/2023 4.6 3.5 - 5.1 mmol/L Final  03/30/2014 3.7 3.5 - 5.1 mmol/L Final         Passed - Patient is not pregnant      Passed - Last BP in normal range    BP Readings from Last 1 Encounters:  12/01/23 138/86         Passed - Valid encounter within last 6 months    Recent Outpatient Visits           1 week ago Type 2 diabetes mellitus with hyperglycemia, without long-term current use of insulin [facility])   [place] [facility] [place],  Gentry Fitz, DO       Future Appointments             In 2 months Margarita Mail, DO [facility], [facility]

## 2023-12-15 ENCOUNTER — Ambulatory Visit: Admitting: Internal Medicine

## 2023-12-18 ENCOUNTER — Other Ambulatory Visit: Payer: Self-pay

## 2023-12-18 DIAGNOSIS — Z87898 Personal history of other specified conditions: Secondary | ICD-10-CM | POA: Diagnosis not present

## 2023-12-18 DIAGNOSIS — J189 Pneumonia, unspecified organism: Secondary | ICD-10-CM | POA: Diagnosis not present

## 2023-12-18 DIAGNOSIS — I959 Hypotension, unspecified: Secondary | ICD-10-CM | POA: Diagnosis not present

## 2023-12-18 DIAGNOSIS — R918 Other nonspecific abnormal finding of lung field: Secondary | ICD-10-CM | POA: Diagnosis not present

## 2023-12-18 NOTE — Patient Outreach (Signed)
 Complex Care Management   Visit Note  12/18/2023  Name:  Stephanie [name] MRN: [id number] DOB: Dec 01, 1965  Situation: Referral received for Complex Care Management related to  Insurance coverage  I obtained verbal consent from Patient.  Visit completed with Patient  on the phone. Patient states she has resolved all insurance issues and no resources are needed.   Background:   Past Medical History:  Diagnosis Date   Closed fracture of shaft of humerus 01/20/2017   Diabetes mellitus without complication (HCC)    Difficult intubation    Diverticulitis    Headache    Hepatitis C    treated and resolved   History of kidney stones    Hypertension    Hypotension 08/22/2018   IBS (irritable bowel syndrome)    Kidney stones    Sciatica    Sleep apnea    on cpap   Stroke [facility]) 2017   memory loss   Superficial venous thrombosis of arm, left 07/22/2016    Assessment: Patient Reported Symptoms:  Cognitive    Neurological      HEENT      Cardiovascular      Respiratory      Endocrine      Gastrointestinal      Genitourinary      Integumentary      Musculoskeletal      Psychosocial       There were no vitals filed for this visit.  Medications Reviewed Today   Medications were not reviewed in this encounter     Recommendation:   Patient will contact BSW for any future needs.   Follow Up Plan:   Patient has met all care management goals. Care Management case will be closed. Patient has been provided contact information should new needs arise.   Gus Puma, Kenard Gower, MHA [place]  [facility] Social Worker, Population Health [phone]

## 2023-12-18 NOTE — Patient Instructions (Signed)
 Visit Information  Ms. Herrera was given information about Medicaid Managed Care team care coordination services as a part of their [facility] Medicaid benefit. MADDIE BRAZIER verbally consented to engagement with the [facility] Managed Care team.   If you are experiencing a medical emergency, please call 911 or report to your local emergency department or urgent care.   If you have a non-emergency medical problem during routine business hours, please contact your provider's office and ask to speak with a nurse.   For questions related to your [facility] health plan, please call: [phone] or go here:[email or web address]  If you would like to schedule transportation through your [facility] plan, please call the following number at least 2 days in advance of your appointment: [phone].   You can also use the MTM portal or MTM mobile app to manage your rides. Reimbursement for transportation is available through [facility]! For the portal, please go to [email or web address]  Call the [facility] Crisis Line at [phone], at any time, 24 hours a day, 7 days a week. If you are in danger or need immediate medical attention call 911.  If you would like help to quit smoking, call 1-800-QUIT-NOW ((430)160-6879) OR Espaol: 1-855-Djelo-Ya (7-062-376-2831) o para ms informacin haga clic aqu or Text READY to 517-616 to register via text  Ms. Efaw - following are the goals we discussed in your visit today:   Goals Addressed   None      The  Patient                                              has been provided with contact information for the Managed Medicaid care management team and has been advised to call with any health related questions or concerns.  Gus Puma, Kenard Gower, MHA [place]  [facility] Social Worker, Population Health [phone]   Following is a copy of your plan of care:  There are no care plans that you recently  modified to display for this patient.

## 2023-12-19 ENCOUNTER — Ambulatory Visit: Admitting: Internal Medicine

## 2023-12-20 DIAGNOSIS — Z419 Encounter for procedure for purposes other than remedying health state, unspecified: Secondary | ICD-10-CM | POA: Diagnosis not present

## 2023-12-22 DIAGNOSIS — J189 Pneumonia, unspecified organism: Secondary | ICD-10-CM | POA: Diagnosis not present

## 2023-12-31 ENCOUNTER — Other Ambulatory Visit: Payer: Self-pay

## 2023-12-31 NOTE — Patient Outreach (Signed)
 Complex Care Management   Visit Note  12/31/2023  Name:  Stephanie [name] MRN: [id number] DOB: 1966/01/23  Situation: Referral received for Complex Care Management related to  Diabetes  I obtained verbal consent from Patient.  Visit completed with patient  on the phone  Background:   Past Medical History:  Diagnosis Date   Closed fracture of shaft of humerus 01/20/2017   Diabetes mellitus without complication (HCC)    Difficult intubation    Diverticulitis    Headache    Hepatitis C    treated and resolved   History of kidney stones    Hypertension    Hypotension 08/22/2018   IBS (irritable bowel syndrome)    Kidney stones    Sciatica    Sleep apnea    on cpap   Stroke (HCC) 2017   memory loss   Superficial venous thrombosis of arm, left 07/22/2016    Assessment: Patient Reported Symptoms:  Cognitive Cognitive Status: Alert and oriented to person, place, and time, Insightful and able to interpret abstract concepts, Normal speech and language skills Cognitive/Intellectual Conditions Management [RPT]: None reported or documented in medical history or problem list   Health Maintenance Behaviors: Annual physical exam, Exercise, Immunizations Healing Pattern: Average  Neurological Neurological Review of Symptoms: Dizziness (patient states she has dizziness occasionally) Neurological Conditions: Stroke, hemorrhagic (patient states had a stroke 2017. No deficits reported.) Neurological Management Strategies: Medication therapy, Routine screening, Exercise, Weight management, Diet modification Neurological Self-Management Outcome: 4 (good)  HEENT HEENT Symptoms Reported: Other: Other HEENT Symptoms/Conditions: patient states she is being seen by an ENT for allergies and large larynx HEENT Conditions:  (none) HEENT Management Strategies: Routine screening HEENT Self-Management Outcome: 4 (good)  (none)  Cardiovascular Cardiovascular Symptoms Reported: No symptoms  reported Does patient have uncontrolled Hypertension?: No Cardiovascular Conditions: Hypertension Cardiovascular Management Strategies: Routine screening, Medication therapy Cardiovascular Self-Management Outcome: 4 (good) Cardiovascular Comment: Patient states she plans to purchase a blood pressure monitor this weekend and will start monitoring her blood pressure daily as recommended. Patient states she was not able to obtain her Plavix  refill from the pharmacy.  Respiratory Respiratory Symptoms Reported: Dry cough Respiratory Conditions: Seasonal allergies, Sleep disordered breathing (obstructive sleep apnea- wears CPAP) Respiratory Self-Management Outcome: 4 (good) Respiratory Comment: patient states her symptoms related to the severe cough has gotten much better with recent treatment of medrol  dose pack and doxycycline . Patient states she received her nebulizer and uses and needed. She reports wearing her CPAP nightly.  Endocrine Patient reports the following symptoms related to hypoglycemia or hyperglycemia : No symptoms reported Is patient diabetic?: Yes Is patient checking blood sugars at home?: Yes Endocrine Conditions: Diabetes Endocrine Management Strategies: Routine screening, Exercise, Medication therapy, Medical device, Diet modification Endocrine Self-Management Outcome: 4 (good)  Gastrointestinal Gastrointestinal Symptoms Reported: No symptoms reported Gastrointestinal Conditions: Nausea Gastrointestinal Management Strategies: Medication therapy (routine follow up) Gastrointestinal Self-Management Outcome: 4 (good) Gastrointestinal Comment: patient states the nausea she was having is not as bad as it was. Nutrition Risk Screen (CP): No indicators present  Genitourinary Genitourinary Symptoms Reported: No symptoms reported Genitourinary Conditions:  (none)  Integumentary Integumentary Symptoms Reported: Skin changes Additional Integumentary Details: patient states she bruises  easily Skin Conditions:  (none per patient.)  Musculoskeletal Musculoskelatal Symptoms Reviewed: No symptoms reported Musculoskeletal Conditions:  (none per patient.)      Psychosocial Psychosocial Symptoms Reported: No symptoms reported Additional Psychological Details: patient states she is being seen by psychiatrist and counselor regularly and is on medication.  She states she feels her treatment plan is managing her condition/ symptoms. Behavioral Health Conditions: Depression, Anxiety, Bipolar affective disorder (diagnosed at age 58) Behavioral Health Self-Management Outcome: 4 (good) Major Change/Loss/Stressor/Fears (CP): Denies Quality of Family Relationships: supportive Do you feel physically threatened by others?: No      08/01/2023    9:32 AM  Depression screen PHQ 2/9  Decreased Interest 0  Down, Depressed, Hopeless 1  PHQ - 2 Score 1  Altered sleeping 0  Tired, decreased energy 0  Change in appetite 0  Feeling bad or failure about yourself  0  Trouble concentrating 0  Moving slowly or fidgety/restless 0  Suicidal thoughts 0  PHQ-9 Score 1  Difficult doing work/chores Not difficult at all    There were no vitals filed for this visit.  Medications Reviewed Today     Reviewed by Tyjae Shvartsman E, RN (Registered Nurse) on 12/31/23 at [phone]  Med List Status: <None>   Medication Order Taking? Sig Documenting Provider Last Dose Status Informant  ADVAIR DISKUS 100-50 MCG/ACT AEPB [id number] Yes INHALE 1 DOSE BY MOUTH TWICE DAILY Rockney Cid, DO Taking Active   albuterol  (VENTOLIN  HFA) 108 (90 Base) MCG/ACT inhaler [id number] Yes INHALE 2 PUFFS BY MOUTH EVERY 6 HOURS AS NEEDED FOR WHEEZING OR SHORTNESS OF Limmie Ren, DO Taking Active   ALPRAZolam  (XANAX ) 1 MG tablet [id number] Yes Take 1 mg by mouth 2 (two) times daily. [provider] Taking Active   amLODipine  (NORVASC ) 2.5 MG tablet [id number] Yes Take 1 tablet by mouth once daily Rockney Cid, DO Taking Active   ARIPiprazole  (ABILIFY ) 15 MG tablet [id number] Yes Take 15 mg by mouth every morning. [provider] Taking Active Self, Multiple Informants  benzonatate  (TESSALON ) 100 MG capsule [id number] Yes TAKE 1 CAPSULE BY MOUTH TWICE DAILY AS NEEDED FOR COUGH Rockney Cid, DO Taking Active   clopidogrel  (PLAVIX ) 75 MG tablet [id number] No Take 75 mg by mouth daily.  Patient not taking: Reported on 12/01/2023   [provider] Not Taking Active Self, Multiple Informants  Continuous Glucose Receiver (FREESTYLE LIBRE 2 READER) DEVI [id number]  Patient lost her's  Patient not taking: Reported on 12/10/2023   Rockney Cid, DO  Active   Continuous Glucose Sensor (FREESTYLE LIBRE 2 SENSOR) [place] [id number]  1 each by Does not apply route every 14 (fourteen) days.  Patient not taking: Reported on 09/16/2023   Rockney Cid, DO  Active   diazepam  (VALIUM ) 10 MG tablet [id number] No Take 10 mg by mouth 3 (three) times daily.  Patient not taking: Reported on 12/01/2023   [provider] Not Taking Active Self, Multiple Informants           Med Note Randene Bustard Oct 10, 2022  6:19 PM)    furosemide  (LASIX ) 40 MG tablet [id number] Yes TAKE 1 TABLET BY MOUTH TWICE DAILY AS NEEDED FOR  EDEMA  (CAN  TAKE  AN  ADDITIONAL  TABLET  IN  THE  AFTERNOON) Rockney Cid, DO Taking Active   KLOR-CON  M20 20 MEQ tablet [id number] Yes TAKE 1 TABLET BY MOUTH TWICE DAILY FOR 3 DAYS,THEN 1 TABLET ONCE DAILY WITH LASIX  Rockney Cid, DO Taking Active   lamoTRIgine  (LAMICTAL ) 100 MG tablet [id number] No Take 100 mg by mouth 2 (two) times daily.  Patient not taking: Reported on 12/31/2023   [provider] Not Taking Active   lisinopril  (ZESTRIL ) 10 MG tablet [id number] Yes Take 1 tablet by mouth once  daily Rockney Cid, DO Taking Active   metFORMIN  (GLUCOPHAGE -XR) 500 MG 24 hr tablet [id number] Yes TAKE 1 TABLET BY MOUTH TWICE DAILY WITH A MEAL  Rockney Cid, DO Taking Active   omeprazole  (PRILOSEC) 40 MG capsule [id number] Yes Take 1 capsule by mouth once daily Rockney Cid, DO Taking Active   prazosin  (MINIPRESS ) 5 MG capsule [id number] Yes Take 5 mg by mouth at bedtime. [provider] Taking Active   promethazine  (PHENERGAN ) 12.5 MG tablet [id number] Yes Take 1 tablet (12.5 mg total) by mouth every 8 (eight) hours as needed for nausea or vomiting. Rockney Cid, DO Taking Active   Semaglutide ,0.25 or 0.5MG /DOS, (OZEMPIC , 0.25 OR 0.5 MG/DOSE,) 2 MG/3ML SOPN [id number] Yes Inject 0.5 mg into the skin once a week. Rockney Cid, DO Taking Active   traZODone  (DESYREL ) 150 MG tablet [id number] Yes Take by mouth at bedtime. [provider] Taking Active            Med Note Marrie Sizer, Kanin Lia E   Tue Dec 09, 2023  1:33 PM) Patient states she takes 200 mg at HS.   Vilazodone HCl (VIIBRYD) 40 MG TABS [id number] Yes Take 40 mg by mouth daily. [provider] Taking Active   Med List Note Merline Starr, CPhT 04/17/22 1014): Gertha Ku Belvia Boyer   [phone]             Recommendation:   PCP Follow-up   Follow Up Plan:   Telephone follow-up in 1 month with RN Care Manager  Verba Girt RN, BSN, CCM [facility], Population Health Case Manager Phone: [phone]

## 2023-12-31 NOTE — Patient Instructions (Signed)
 Visit Information  Thank you for taking time to visit with me today. Please don't hesitate to contact me if I can be of assistance to you before our next scheduled appointment.  Our next appointment is by telephone on 01/30/24 at 10 am Please call the care guide team at [phone] if you need to cancel or reschedule your appointment.   Following is a copy of your care plan:   Goals Addressed             This Visit's Progress    COMPLETED: Education and management of health conditions.       Interventions Today    Flowsheet Row Most Recent Value  Chronic Disease   Chronic disease during today's visit Diabetes, Other  [Right lower quadrant abdominal pain/Nausea and vomiting]  General Interventions   General Interventions Discussed/Reviewed General Interventions Discussed, Labs, Doctor Visits  [evaluation of current treatment plan for listed health conditions and patients adherence to plan as established by providers]  Labs Hgb A1c every 3 months  [Discussed most recent Hgb A1c and goal.]  Doctor Visits Discussed/Reviewed Doctor Visits Discussed  [Discussed primary provider visit /plan of care from 12/01/23.  Reviewed upcoming provider visits. Advised to keep follow up visits with provider as recommended. Confirmed patient has transportation to provider visits.]  Education Interventions   Education Provided Provided Education  [Advised to continue using notebook to record appointments, provider/ health provider names to help with memory. Advised to notify provider for any new/ ongoing symptoms.  Call 911 for severe symptoms.]  Provided Verbal Education On Blood Sugar Monitoring, When to see the doctor, Other  [Discussed abdominal symptoms. Advised patient to have blood work completed at provider office as recommended. Assessed blood sugar readings. Confirmed patient has family support. Advised to notify provider of frequent blood sugars <70 or >250.]  Nutrition Interventions   Nutrition  Discussed/Reviewed Nutrition Discussed  [Assess appetite. Advised not to eat spicey or greasy foods due to stomach symptom/nausea / vomiting.]  Pharmacy Interventions   Pharmacy Dicussed/Reviewed Pharmacy Topics Discussed  [Reviewed medications list. Discussed importance of medicaiton compliance. Advised patient to notify neurologist that she is not taking Plavix  or lamictal . Message sent to primary care provider informing her patient not taking plavix / lamictal .]  Advanced Directive Interventions   Advanced Directives Discussed/Reviewed Advanced Directives Discussed           VBCI RN Care Plan- DIABETES       Problems:  Chronic Disease Management support and education needs related to DMII  Goal: Over the next 3 months the Patient will attend all scheduled medical appointments: with providers as evidenced by patient report/ chart review.         continue to work with Medical illustrator and/or Social Worker to address care management and care coordination needs related to DMII as evidenced by adherence to care management team scheduled appointments     demonstrate Ongoing adherence to prescribed treatment plan for DMII as evidenced by patient report /chart review.  take all medications exactly as prescribed and will call provider for medication related questions as evidenced by patient report/ chart review.     Patient will obtain her plavix  prescription  Interventions:   Diabetes Interventions: Assessed patient's understanding of A1c goal: <7% Reviewed medications with patient and discussed importance of medication adherence Discussed plans with patient for ongoing care management follow up and provided patient with direct contact information for care management team Reviewed scheduled/upcoming provider appointments including:   Assessed social determinant  of health barriers Contacted patients [facility] and spoke with Crystal requesting refill for patients Plavix .  Patient advised  to call Medicaid to inform them she doesn't have another insurance carrier so they will cover her Plavix .  Mailed patient Advance Directive as requested.  Assessed blood sugar readings.  Discussed hypoglycemic treatment Rule of 15. Advised to eat a form of protein after hypoglycemic treatment to help with stabilizing blood sugar.  Lab Results  Component Value Date   HGBA1C 7.8 (A) 12/01/2023    Patient Self-Care Activities:  Attend all scheduled provider appointments Call pharmacy for medication refills 3-7 days in advance of running out of medications Call provider office for new concerns or questions  Take medications as prescribed   check blood sugar at prescribed times: before meals and at bedtime and when you have symptoms of low or high blood sugar check feet daily for cuts, sores or redness take the blood sugar log to all doctor visits Review Advance Directive when received and contact your RN case manager if you have questions.  Call Medicaid to inform them you do not have another insurance carrier/ plan.  Follow up with [facility] to obtain your Plavix  prescription  Plan:  Telephone follow up appointment with care management team member scheduled for:  01/30/24 at 10 am             Please call the Suicide and Crisis Lifeline: 988 call 1-800-273-TALK (toll free, 24 hour hotline) if you are experiencing a Mental Health or Behavioral Health Crisis or need someone to talk to.  Patient verbalizes understanding of instructions and care plan provided today and agrees to view in MyChart. Active MyChart status and patient understanding of how to access instructions and care plan via MyChart confirmed with patient.     Verba Girt RN, BSN, CCM [employer], Population Health Case Manager Phone: [phone]

## 2024-01-01 ENCOUNTER — Other Ambulatory Visit: Payer: Self-pay | Admitting: Internal Medicine

## 2024-01-01 DIAGNOSIS — E1165 Type 2 diabetes mellitus with hyperglycemia: Secondary | ICD-10-CM

## 2024-01-02 NOTE — Telephone Encounter (Signed)
 Requested Prescriptions  Pending Prescriptions Disp Refills   Continuous Glucose Sensor (FREESTYLE LIBRE 2 SENSOR) MISC [Pharmacy Med Name: FREESTYLE LIBRE 2 SEN 14D KIT] 4 each 0    Sig: USE ONE SENSOR EVERY 14 DAYS     Endocrinology: Diabetes - Testing Supplies Passed - 01/02/2024  1:04 PM      Passed - Valid encounter within last 12 months    Recent Outpatient Visits           1 month ago Type 2 diabetes mellitus with hyperglycemia, without long-term current use of insulin  [facility])   [facility] Rockney Cid, DO       Future Appointments             In 2 months Rockney Cid, DO [facility], [facility]

## 2024-01-07 DIAGNOSIS — F4312 Post-traumatic stress disorder, chronic: Secondary | ICD-10-CM | POA: Diagnosis not present

## 2024-01-07 DIAGNOSIS — F319 Bipolar disorder, unspecified: Secondary | ICD-10-CM | POA: Diagnosis not present

## 2024-01-07 DIAGNOSIS — F410 Panic disorder [episodic paroxysmal anxiety] without agoraphobia: Secondary | ICD-10-CM | POA: Diagnosis not present

## 2024-01-07 DIAGNOSIS — F411 Generalized anxiety disorder: Secondary | ICD-10-CM | POA: Diagnosis not present

## 2024-01-09 ENCOUNTER — Other Ambulatory Visit: Payer: Self-pay | Admitting: Internal Medicine

## 2024-01-09 DIAGNOSIS — I10 Essential (primary) hypertension: Secondary | ICD-10-CM

## 2024-01-12 DIAGNOSIS — M5481 Occipital neuralgia: Secondary | ICD-10-CM | POA: Diagnosis not present

## 2024-01-12 DIAGNOSIS — M7918 Myalgia, other site: Secondary | ICD-10-CM | POA: Diagnosis not present

## 2024-01-12 DIAGNOSIS — G8929 Other chronic pain: Secondary | ICD-10-CM | POA: Diagnosis not present

## 2024-01-13 NOTE — Telephone Encounter (Signed)
 Requested Prescriptions  Pending Prescriptions Disp Refills   lisinopril  (ZESTRIL ) 10 MG tablet [Pharmacy Med Name: Lisinopril  10 MG Oral Tablet] 30 tablet 0    Sig: Take 1 tablet by mouth once daily     Cardiovascular:  ACE Inhibitors Failed - 01/13/2024  8:20 AM      Failed - Cr in normal range and within 180 days    Creat  Date Value Ref Range Status  05/19/2023 0.73 0.50 - 1.03 mg/dL Final   Creatinine, Ser  Date Value Ref Range Status  06/09/2023 0.78 0.44 - 1.00 mg/dL Final   Creatinine, Urine  Date Value Ref Range Status  06/30/2023 118 20 - 275 mg/dL Final         Failed - K in normal range and within 180 days    Potassium  Date Value Ref Range Status  06/09/2023 4.6 3.5 - 5.1 mmol/L Final  03/30/2014 3.7 3.5 - 5.1 mmol/L Final         Passed - Patient is not pregnant      Passed - Last BP in normal range    BP Readings from Last 1 Encounters:  12/01/23 138/86         Passed - Valid encounter within last 6 months    Recent Outpatient Visits           1 month ago Type 2 diabetes mellitus with hyperglycemia, without long-term current use of insulin  [facility])   [facility] Rockney Cid, DO       Future Appointments             In 1 month Rockney Cid, DO [facility], [facility]

## 2024-01-14 DIAGNOSIS — Z7689 Persons encountering health services in other specified circumstances: Secondary | ICD-10-CM | POA: Diagnosis not present

## 2024-01-15 DIAGNOSIS — R0602 Shortness of breath: Secondary | ICD-10-CM | POA: Diagnosis not present

## 2024-01-15 DIAGNOSIS — R0689 Other abnormalities of breathing: Secondary | ICD-10-CM | POA: Diagnosis not present

## 2024-01-15 DIAGNOSIS — R9389 Abnormal findings on diagnostic imaging of other specified body structures: Secondary | ICD-10-CM | POA: Diagnosis not present

## 2024-01-15 DIAGNOSIS — J449 Chronic obstructive pulmonary disease, unspecified: Secondary | ICD-10-CM | POA: Diagnosis not present

## 2024-01-15 DIAGNOSIS — R0600 Dyspnea, unspecified: Secondary | ICD-10-CM | POA: Diagnosis not present

## 2024-01-15 DIAGNOSIS — R911 Solitary pulmonary nodule: Secondary | ICD-10-CM | POA: Diagnosis not present

## 2024-01-19 DIAGNOSIS — Z419 Encounter for procedure for purposes other than remedying health state, unspecified: Secondary | ICD-10-CM | POA: Diagnosis not present

## 2024-01-20 ENCOUNTER — Ambulatory Visit: Admitting: Internal Medicine

## 2024-01-20 ENCOUNTER — Encounter: Payer: Self-pay | Admitting: Internal Medicine

## 2024-01-20 VITALS — BP 128/84 | HR 103 | Temp 98.1°F | Resp 16 | Ht 62.0 in | Wt 203.1 lb

## 2024-01-20 DIAGNOSIS — J439 Emphysema, unspecified: Secondary | ICD-10-CM | POA: Diagnosis not present

## 2024-01-20 DIAGNOSIS — E1165 Type 2 diabetes mellitus with hyperglycemia: Secondary | ICD-10-CM

## 2024-01-20 DIAGNOSIS — Z7985 Long-term (current) use of injectable non-insulin antidiabetic drugs: Secondary | ICD-10-CM | POA: Diagnosis not present

## 2024-01-20 DIAGNOSIS — Z7984 Long term (current) use of oral hypoglycemic drugs: Secondary | ICD-10-CM | POA: Diagnosis not present

## 2024-01-20 LAB — GLUCOSE, POCT (MANUAL RESULT ENTRY): POC Glucose: 257 mg/dL — AB (ref 70–99)

## 2024-01-20 LAB — POCT GLYCOSYLATED HEMOGLOBIN (HGB A1C): Hemoglobin A1C: 8.3 % — AB (ref 4.0–5.6)

## 2024-01-20 MED ORDER — SEMAGLUTIDE (1 MG/DOSE) 4 MG/3ML ~~LOC~~ SOPN
1.0000 mg | PEN_INJECTOR | SUBCUTANEOUS | 2 refills | Status: DC
Start: 2024-01-20 — End: 2024-04-16

## 2024-01-20 NOTE — Progress Notes (Signed)
 Acute Office Visit  Subjective:     [name]: Stephanie [name], female    DOB: 1966-07-31, 58 y.o.   MRN: [id number]  Chief Complaint  Patient presents with   Diabetes    Elevated BS running 250's to 400    HPI Patient is in today for elevated blood sugar.   Discussed the use of AI scribe software for clinical note transcription with the patient, who gave verbal consent to proceed.  History of Present Illness Stephanie [name] is a 58 year old female with diabetes who presents with elevated blood sugar levels.  Blood sugar levels have fluctuated between 50 and 400 mg/dL over the past month, with higher readings in the morning and after meals. She is on Ozempic  0.5 mg and metformin  twice daily. The most recent blood sugar reading is 257 mg/dL. Hemoglobin A1c has increased from 7.8% in March to 8.3% currently, though it has improved from 8.9% in November. No side effects from current diabetes medications.  She experiences wheezing upon waking, which improves throughout the day. She uses Advair and a nebulizer for respiratory symptoms and keeps a rescue inhaler (albuterol ) by her bed or recliner for nighttime use.  She gets about three hours of sleep per night, often going back to sleep after her husband gets up.  Diabetes, Type 2: -Last A1c 11/24 8.9% - had a medicare nurse check on 09/13/22 and it was 7.9% -Medications: Ozempic  0.5 mg, Metformin  500 XR mg BID. -Had been on Pioglitazone  15 mg, Glipizide  and Actos  but not currently taking. Tried to prescribe Rybelsus  in the past but cost was too much.  -Patient is compliant with the above medications and reports no side effects. Doing well so far with the Ozempic , lost about 9 lbs so far -Checking BG at home: 240-400 -Eye exam: Due - patient will call to schedule -Foot exam: UTD -Microalbumin: UTD -Statin: uncertain if taking -PNA vaccine: UTD -Denies symptoms of hypoglycemia, polyuria, polydipsia, numbness extremities, foot  ulcers/trauma.  Review of Systems  All other systems reviewed and are negative.       Objective:     BP 128/84   Pulse (!) 103   Temp 98.1 F (36.7 C) (Oral)   Resp 16   Ht 5\' 2"  (1.575 m)   Wt 203 lb 1.6 oz (92.1 kg)   SpO2 97%   BMI 37.15 kg/m    Physical Exam Constitutional:      Appearance: Normal appearance.  HENT:     Head: Normocephalic and atraumatic.  Eyes:     Conjunctiva/sclera: Conjunctivae normal.  Cardiovascular:     Rate and Rhythm: Normal rate and regular rhythm.  Pulmonary:     Effort: Pulmonary effort is normal.     Breath sounds: Normal breath sounds.  Skin:    General: Skin is warm and dry.  Neurological:     General: No focal deficit present.     Mental Status: She is alert. Mental status is at baseline.  Psychiatric:        Mood and Affect: Mood normal.        Behavior: Behavior normal.     Results for orders placed or performed in visit on 01/20/24  POCT Glucose (CBG)  Result Value Ref Range   POC Glucose 257 (A) 70 - 99 mg/dl  POCT HgB W0J  Result Value Ref Range   Hemoglobin A1C 8.3 (A) 4.0 - 5.6 %   HbA1c POC (<> result, manual entry)  HbA1c, POC (prediabetic range)     HbA1c, POC (controlled diabetic range)          Assessment & Plan:   Assessment & Plan Type 2 diabetes mellitus with hyperglycemia Elevated blood glucose (257) and increased HbA1c (8.3%) indicate worsening glycemic control. Current medication regimen includes Ozempic  0.5 and metformin , with no side effects reported. Decision to increase Ozempic  dose based on need for improved control and cost considerations. - Increase Ozempic  to 1 mg once current supply is finished. - Monitor blood glucose levels and report significant elevations. - Continue metformin  as prescribed. - Follow up in June for reassessment.  COPD Wheezing upon waking suggests current inhaler regimen may be insufficient. Trelegy considered as a potential replacement for Advair due to  insurance preference and additional bronchodilator component. - Provide a sample of Trelegy for a two-week trial. - Discontinue Advair and use Trelegy daily during the trial period. - Continue using albuterol  as needed for acute symptoms. - Evaluate the effectiveness of Trelegy after the trial period and consider prescribing if beneficial.  - POCT Glucose (CBG) - POCT HgB A1C - Semaglutide , 1 MG/DOSE, 4 MG/3ML SOPN; Inject 1 mg as directed once a week.  Dispense: 3 mL; Refill: 2  Return for already scheduled.  Rockney Cid, DO

## 2024-01-23 ENCOUNTER — Other Ambulatory Visit: Payer: Self-pay | Admitting: Internal Medicine

## 2024-01-23 DIAGNOSIS — R052 Subacute cough: Secondary | ICD-10-CM

## 2024-01-26 NOTE — Telephone Encounter (Signed)
 Requested Prescriptions  Refused Prescriptions Disp Refills   benzonatate  (TESSALON ) 100 MG capsule [Pharmacy Med Name: Benzonatate  100 MG Oral Capsule] 20 capsule 0    Sig: TAKE 1 CAPSULE BY MOUTH TWICE DAILY AS NEEDED FOR COUGH     Ear, Nose, and Throat:  Antitussives/Expectorants Passed - 01/26/2024  4:35 PM      Passed - Valid encounter within last 12 months    Recent Outpatient Visits           6 days ago Type 2 diabetes mellitus with hyperglycemia, without long-term current use of insulin  [facility])   [facility] Rockney Cid, DO   1 month ago Type 2 diabetes mellitus with hyperglycemia, without long-term current use of insulin  [facility])   [facility] Rockney Cid, DO       Future Appointments             In 1 month Rockney Cid, DO [facility], [facility]

## 2024-01-29 DIAGNOSIS — Z7689 Persons encountering health services in other specified circumstances: Secondary | ICD-10-CM | POA: Diagnosis not present

## 2024-01-29 DIAGNOSIS — N200 Calculus of kidney: Secondary | ICD-10-CM | POA: Diagnosis not present

## 2024-01-30 ENCOUNTER — Telehealth: Payer: Self-pay

## 2024-02-03 ENCOUNTER — Other Ambulatory Visit: Payer: Self-pay | Admitting: *Deleted

## 2024-02-03 ENCOUNTER — Encounter: Payer: Self-pay | Admitting: *Deleted

## 2024-02-03 LAB — HM DIABETES EYE EXAM

## 2024-02-03 NOTE — Patient Instructions (Signed)
 Visit Information  Stephanie [name] was given information about Medicaid Managed Care team care coordination services as a part of their [facility] Medicaid benefit. Stephanie [name] verbally consented to engagement with the [facility] Managed Care team.   If you are experiencing a medical emergency, please call 911 or report to your local emergency department or urgent care.   If you have a non-emergency medical problem during routine business hours, please contact your provider's office and ask to speak with a nurse.   For questions related to your [facility] health plan, please call: [phone] or go here:[email or web address]  If you would like to schedule transportation through your [facility] plan, please call the following number at least 2 days in advance of your appointment: [phone].   You can also use the MTM portal or MTM mobile app to manage your rides. Reimbursement for transportation is available through [facility]! For the portal, please go to [email or web address]  Call the [facility] Crisis Line at [phone] [phone], at any time, 24 hours a day, 7 days a week. If you are in danger or need immediate medical attention call 911.  If you would like help to quit smoking, call 1-800-QUIT-NOW ((318) 232-8263) OR Espaol: 1-855-Djelo-Ya (6-433-295-1884) o para ms informacin haga clic aqu or Text READY to 166-063 to register via text  Stephanie [name] - following are the goals we discussed in your visit today:   Goals Addressed             This Visit's Progress    VBCI RN Care Plan: Diabetes   On track    Problems:  Chronic Disease Management support and education needs related to [facility] Freestyle [employer] are too expensive and non-preferred with Medicaid through [employer]  Goal: Over the next 3 months the Patient will demonstrate Ongoing adherence to prescribed treatment plan for DMII as evidenced by an A1C of less than 7% Over the next 30 days, the patient  will work with her PCP office to obtain continuous glucose monitor sensors Over the next 7 days, the patient will see an improvement in her URI symptoms and will F/U with her PCP or seek medical attention for any new, worsening, or persistent symptoms  Interventions:  Diabetes Interventions: Assessed patient's understanding of A1c goal: <7% Reviewed medications with patient and discussed importance of medication adherence Discussed plans with patient for ongoing care management follow up and provided patient with direct contact information for care management team Reviewed scheduled/upcoming provider appointments including: video visit with PCP for 02/04/24 to address productive cough and congestion. Assessed social determinant of health barriers Discussed effect of illness on diabetes management and Sick Day Rules Advised to keep appointment with PCP tomorrow and to seek medical attention for any new, worsening, or persistent symptoms Encouraged to drink plenty of water  and to check blood sugar more frequently when sick Discussed that cost of Freestyle Libre sensors at $90 is prohibitive Staff message sent to PCP office: questioned if a prior authorization was started on 01/02/24 after coverage of sensors was denied. Prior authorization request is scanned into media. Advised that Dexcom G6 or G7 is preferred through [facility] Lab Results  Component Value Date   HGBA1C 7.8 (A) 12/01/2023    Patient Self-Care Activities:  Attend all scheduled provider appointments Call provider office for new concerns or questions  check blood sugar at prescribed times: before meals and at bedtime and when you have symptoms of low or high blood sugar Drink plenty of water  Recommended plain Mucinex and to  check with pharmacist about medication interactions  Plan:  Telephone follow up appointment with care management team member scheduled for:  02/06/24 at 10 am             Please see education  materials related to Diabetes and Sick Day Management provided by MyChart link. and as Financial risk analyst.   The patient verbalized understanding of instructions, educational materials, and care plan provided today and agreed to receive a mailed copy of patient instructions, educational materials, and care plan.   Follow-up appointment scheduled with RN Care Manager for 02/06/24 at 10:00 by telephone.   Michele Ahle, RN, BSN [place]  [facility] Health RN Care Manager Direct Dial: [phone]  Fax: [phone]

## 2024-02-03 NOTE — Patient Outreach (Signed)
 Complex Care Management   Visit Note  02/03/2024  Name:  Stephanie [name] MRN: [id number] DOB: 03-Mar-1966  Situation: Referral received for Complex Care Management related to diabetes I obtained verbal consent from Patient.  Visit completed with Stephanie [name] the phone  Background:   Past Medical History:  Diagnosis Date   Closed fracture of shaft of humerus 01/20/2017   Diabetes mellitus without complication (HCC)    Difficult intubation    Diverticulitis    Headache    Hepatitis C    treated and resolved   History of kidney stones    Hypertension    Hypotension 08/22/2018   IBS (irritable bowel syndrome)    Kidney stones    Sciatica    Sleep apnea    on cpap   Stroke [facility]) 2017   memory loss   Superficial venous thrombosis of arm, left 07/22/2016    Assessment: Patient Reported Symptoms:  Cognitive Cognitive Status: Alert and oriented to person, place, and time Cognitive/Intellectual Conditions Management [RPT]: None reported or documented in medical history or problem list   Health Maintenance Behaviors: Annual physical exam, Healthy diet, Sleep adequate, Immunizations Healing Pattern: Unsure Health Facilitated by: Healthy diet, Rest  Neurological Neurological Review of Symptoms: Not assessed    HEENT HEENT Symptoms Reported: Not assessed      Cardiovascular Cardiovascular Symptoms Reported: No symptoms reported    Respiratory Respiratory Symptoms Reported: Productive cough, Shortness of breath Other Respiratory Symptoms: Cough and congestion with green phlegm x 2 weeks. SOB when active. Additional Respiratory Details: Pt stated, "I have the flu." Has not been tested. Respiratory Conditions: Sleep disordered breathing, Seasonal allergies Respiratory Self-Management Outcome: 3 (uncertain) Respiratory Comment: Taking Dayquil and Nyquil. Recommended to check with pharmacist about taking just plain Mucinex instead. Increase water  intake. Use inhalers and  nebulizer as recommended. Appointment scheduled with PCP for tomorrow. Advised to seek medical attention for new, worsening, or persistent symptoms.  Endocrine Patient reports the following symptoms related to hypoglycemia or hyperglycemia : No symptoms reported Is patient diabetic?: Yes Is patient checking blood sugars at home?: Yes Endocrine Conditions: Diabetes Endocrine Management Strategies: Medical device, Routine screening, Medication therapy, Diet modification Endocrine Self-Management Outcome: 4 (good) Endocrine Comment: Discussed Sick Day Rules and effect of illness of blood sugar management. Contacted PCP office regarding Stephanie [name]. Patient unable to afford the $90 cost. Asked if Prior Authorization has been completed? Coverage denial scanned into media tab on 01/02/24. Advised that Dexcom G6 or G7 is preferred under Medicaid through [facility]. Stephanie [name] is non-preferred and will cost more.  Gastrointestinal Gastrointestinal Symptoms Reported: No symptoms reported      Genitourinary Genitourinary Symptoms Reported: No symptoms reported    Integumentary Integumentary Symptoms Reported: Fever, Chills Additional Integumentary Details: Fever up to 101. Associated URI symptoms. Has PCP appointment tomorrow. Skin Management Strategies: Fluid modification, Medication therapy, Routine screening Skin Self-Management Outcome: 4 (good)  Musculoskeletal Musculoskelatal Symptoms Reviewed: No symptoms reported   Falls in the past year?: No Number of falls in past year: 1 or less Was there an injury with Fall?: No Fall Risk Category Calculator: 0 Patient Fall Risk Level: Low Fall Risk    Psychosocial Psychosocial Symptoms Reported: Not assessed            08/01/2023    9:32 AM  Depression screen PHQ 2/9  Decreased Interest 0  Down, Depressed, Hopeless 1  PHQ - 2 Score 1  Altered sleeping 0  Tired, decreased energy 0  Change in appetite 0  Feeling bad or failure about  yourself  0  Trouble concentrating 0  Moving slowly or fidgety/restless 0  Suicidal thoughts 0  PHQ-9 Score 1  Difficult doing work/chores Not difficult at all    There were no vitals filed for this visit.  Medications Reviewed Today     Reviewed by Stephanie Herald, RN (Registered Nurse) on 02/03/24 at 1351  Med List Status: <None>   Medication Order Taking? Sig Documenting Provider Last Dose Status Informant  ADVAIR DISKUS 100-50 MCG/ACT AEPB [id number]  INHALE 1 DOSE BY MOUTH TWICE DAILY Stephanie Cid, DO  Active   albuterol  (VENTOLIN  HFA) 108 (90 Base) MCG/ACT inhaler [id number]  INHALE 2 PUFFS BY MOUTH EVERY 6 HOURS AS NEEDED FOR WHEEZING OR SHORTNESS OF Stephanie Ren, DO  Active   ALPRAZolam  (XANAX ) 1 MG tablet [id number]  Take 1 mg by mouth 2 (two) times daily. [provider]  Active   amLODipine  (NORVASC ) 2.5 MG tablet [id number]  Take 1 tablet by mouth once daily Stephanie Cid, DO  Active   ARIPiprazole  (ABILIFY ) 15 MG tablet [id number]  Take 15 mg by mouth every morning. [provider]  Active Self, Multiple Informants  clopidogrel  (PLAVIX ) 75 MG tablet [id number] Yes Take 75 mg by mouth daily. [provider] Taking Active Self, Multiple Informants  Continuous Glucose Receiver (FREESTYLE LIBRE 2 READER) DEVI [id number] No Patient lost her's  Patient not taking: Reported on 02/03/2024   Stephanie Cid, DO Not Taking Active   Continuous Glucose Sensor (FREESTYLE [place] 2 SENSOR) [place] [id number]  USE ONE SENSOR EVERY 14 DAYS Stephanie Cid, DO  Active   diazepam  (VALIUM ) 10 MG tablet [id number]  Take 10 mg by mouth 3 (three) times daily.  Patient not taking: Reported on 12/01/2023   [provider]  Active Self, Multiple Informants           Med Note Stephanie [name] Oct 10, 2022  6:19 PM)    furosemide  (LASIX ) 40 MG tablet [id number]  TAKE 1 TABLET BY MOUTH TWICE DAILY AS NEEDED FOR  EDEMA  (CAN  TAKE  AN  ADDITIONAL   TABLET  IN  THE  AFTERNOON) Stephanie Cid, DO  Active   KLOR-CON  M20 20 MEQ tablet [id number]  TAKE 1 TABLET BY MOUTH TWICE DAILY FOR 3 DAYS,THEN 1 TABLET ONCE DAILY WITH LASIX  Stephanie Cid, DO  Active   lamoTRIgine  (LAMICTAL ) 100 MG tablet [id number]  Take 100 mg by mouth 2 (two) times daily. [provider]  Active   lisinopril  (ZESTRIL ) 10 MG tablet [id number]  Take 1 tablet by mouth once daily Stephanie Cid, DO  Active   metFORMIN  (GLUCOPHAGE -XR) 500 MG 24 hr tablet [id number] Yes TAKE 1 TABLET BY MOUTH TWICE DAILY WITH A MEAL Stephanie Cid, DO Taking Active   omeprazole  (PRILOSEC) 40 MG capsule [id number]  Take 1 capsule by mouth once daily Stephanie Cid, DO  Active   prazosin  (MINIPRESS ) 5 MG capsule [id number]  Take 5 mg by mouth at bedtime. [provider]  Active   promethazine  (PHENERGAN ) 12.5 MG tablet [id number]  Take 1 tablet (12.5 mg total) by mouth every 8 (eight) hours as needed for nausea or vomiting. Stephanie Cid, DO  Active   Semaglutide , 1 MG/DOSE, 4 MG/3ML SOPN [id number] Yes Inject 1 mg as directed once a week. Stephanie Cid, DO Taking Active   traZODone  (DESYREL ) 150 MG tablet [id number]  Take by mouth at bedtime. [provider]  Active            Med Note (GREEN, DAVINA E   Tue Dec 09, 2023  1:33 PM) Patient states she takes 200 mg at HS.   Vilazodone HCl (VIIBRYD) 40 MG TABS [id number]  Take 40 mg by mouth daily. [provider]  Active   Med List Note Merline Starr, CPhT 04/17/22 1014): Gertha Ku Belvia Boyer   [phone]             Recommendation:   Acute PCP follow-up Keep Appt. For 02/04/24 for flu-like symptoms. Seek medical attention sooner if necessary for new or worsening symptoms  Follow Up Plan:   Telephone follow up appointment date/time:  02/06/24 at 10:00  Michele Ahle, RN, BSN [place]  [facility] Health RN Care Manager Direct Dial: [phone]  Fax:  [phone]

## 2024-02-04 ENCOUNTER — Other Ambulatory Visit: Payer: Self-pay | Admitting: *Deleted

## 2024-02-04 ENCOUNTER — Inpatient Hospital Stay
Admission: RE | Admit: 2024-02-04 | Discharge: 2024-02-04 | Disposition: A | Discharge status: Home / Self Care | Payer: Self-pay | Source: Ambulatory Visit | Attending: Internal Medicine | Admitting: Internal Medicine

## 2024-02-04 ENCOUNTER — Inpatient Hospital Stay
Admission: RE | Admit: 2024-02-04 | Discharge: 2024-02-04 | Disposition: A | Discharge status: Home / Self Care | Payer: Self-pay | Source: Ambulatory Visit | Attending: Internal Medicine

## 2024-02-04 ENCOUNTER — Telehealth: Admitting: Internal Medicine

## 2024-02-04 ENCOUNTER — Other Ambulatory Visit: Payer: Self-pay

## 2024-02-04 DIAGNOSIS — J441 Chronic obstructive pulmonary disease with (acute) exacerbation: Secondary | ICD-10-CM

## 2024-02-04 DIAGNOSIS — Z1231 Encounter for screening mammogram for malignant neoplasm of breast: Secondary | ICD-10-CM

## 2024-02-04 DIAGNOSIS — K219 Gastro-esophageal reflux disease without esophagitis: Secondary | ICD-10-CM | POA: Diagnosis not present

## 2024-02-04 MED ORDER — OMEPRAZOLE 40 MG PO CPDR: 40.0000 mg | DELAYED_RELEASE_CAPSULE | Freq: Every day | ORAL | 1 refills | Status: AC

## 2024-02-04 MED ORDER — DOXYCYCLINE HYCLATE 100 MG PO TABS: 100.0000 mg | ORAL_TABLET | Freq: Two times a day (BID) | ORAL | 0 refills | Status: AC

## 2024-02-04 MED ORDER — PREDNISONE 20 MG PO TABS: 40.0000 mg | ORAL_TABLET | Freq: Every day | ORAL | 0 refills | Status: AC

## 2024-02-04 MED ORDER — BENZONATATE 100 MG PO CAPS
100.0000 mg | ORAL_CAPSULE | Freq: Two times a day (BID) | ORAL | 0 refills | Status: DC | PRN
Start: 2024-02-04 — End: 2024-04-16

## 2024-02-04 NOTE — Progress Notes (Signed)
 Virtual Visit via Phone Note  I connected with Stephanie [name] on 02/04/24 at 10:00 AM EDT by a telephone and verified that I am speaking with the correct person using two identifiers.  Location: Patient: Home Provider: [facility]   I discussed the limitations of evaluation and management by phone and the availability of in person appointments. The patient expressed understanding and agreed to proceed.  History of Present Illness:  Patient is presenting over the phone due to internet issues and cannot use telemedicine application. She has been coughing for the last 10 days. Cough is productive with thick green mucus. She also endorses feeling short of breath when walking to the bathroom and wheezing, especially at night. She does have COPD, is using her Advair inhaler and nebulizer but still is having symptoms. Denies fevers or upper respiratory symptoms. Also taking over the counter Mucinex which helps a little bit.  Observations/Objective:  General: well sounding Pulm: coughing heard over the phone  Assessment and Plan:  1. COPD exacerbation (HCC) (Primary): Symptoms consistent with exacerbation of COPD, will treat with steroids and Doxycycline. Continue inhalers and will send cough suppressant as well. Will call if symptoms worsen or fail to improve.   - predniSONE  (DELTASONE ) 20 MG tablet; Take 2 tablets (40 mg total) by mouth daily for 5 days.  Dispense: 10 tablet; Refill: 0 - doxycycline (VIBRA-TABS) 100 MG tablet; Take 1 tablet (100 mg total) by mouth 2 (two) times daily for 7 days.  Dispense: 14 tablet; Refill: 0 - benzonatate  (TESSALON ) 100 MG capsule; Take 1 capsule (100 mg total) by mouth 2 (two) times daily as needed for cough.  Dispense: 20 capsule; Refill: 0  2. Gastroesophageal reflux disease, unspecified whether esophagitis present: Patient states she needs PPI refill.   - omeprazole  (PRILOSEC) 40 MG capsule; Take 1 capsule (40 mg total) by mouth daily.  Dispense: 90 capsule;  Refill: 1   Follow Up Instructions: PRN    I discussed the assessment and treatment plan with the patient. The patient was provided an opportunity to ask questions and all were answered. The patient agreed with the plan and demonstrated an understanding of the instructions.   The patient was advised to call back or seek an in-person evaluation if the symptoms worsen or if the condition fails to improve as anticipated.  I provided 10 minutes of non-face-to-face time during this encounter.   Rockney Cid, DO

## 2024-02-05 DIAGNOSIS — J129 Viral pneumonia, unspecified: Secondary | ICD-10-CM | POA: Diagnosis not present

## 2024-02-05 DIAGNOSIS — R0602 Shortness of breath: Secondary | ICD-10-CM | POA: Diagnosis not present

## 2024-02-05 DIAGNOSIS — R0689 Other abnormalities of breathing: Secondary | ICD-10-CM | POA: Diagnosis not present

## 2024-02-05 DIAGNOSIS — R0600 Dyspnea, unspecified: Secondary | ICD-10-CM | POA: Diagnosis not present

## 2024-02-05 DIAGNOSIS — J441 Chronic obstructive pulmonary disease with (acute) exacerbation: Secondary | ICD-10-CM | POA: Diagnosis not present

## 2024-02-06 ENCOUNTER — Encounter: Payer: Self-pay | Admitting: *Deleted

## 2024-02-06 ENCOUNTER — Ambulatory Visit

## 2024-02-06 ENCOUNTER — Other Ambulatory Visit: Payer: Self-pay | Admitting: *Deleted

## 2024-02-06 NOTE — Patient Outreach (Unsigned)
 Complex Care Management   Visit Note  02/06/2024  Name:  Stephanie [name] MRN: [id number] DOB: 10-26-1965  Situation: Referral received for Complex Care Management related to diabetes I obtained verbal consent from Patient.  Visit completed with Phoebe Breed on the phone  Background:   Past Medical History:  Diagnosis Date   Closed fracture of shaft of humerus 01/20/2017   Diabetes mellitus without complication (HCC)    Difficult intubation    Diverticulitis    Headache    Hepatitis C    treated and resolved   History of kidney stones    Hypertension    Hypotension 08/22/2018   IBS (irritable bowel syndrome)    Kidney stones    Sciatica    Sleep apnea    on cpap   Stroke (HCC) 2017   memory loss   Superficial venous thrombosis of arm, left 07/22/2016    Assessment: Patient Reported Symptoms:  Cognitive        Neurological      HEENT        Cardiovascular      Respiratory      Endocrine      Gastrointestinal        Genitourinary      Integumentary      Musculoskeletal          Psychosocial              08/01/2023    9:32 AM  Depression screen PHQ 2/9  Decreased Interest 0  Down, Depressed, Hopeless 1  PHQ - 2 Score 1  Altered sleeping 0  Tired, decreased energy 0  Change in appetite 0  Feeling bad or failure about yourself  0  Trouble concentrating 0  Moving slowly or fidgety/restless 0  Suicidal thoughts 0  PHQ-9 Score 1  Difficult doing work/chores Not difficult at all    There were no vitals filed for this visit.  Medications Reviewed Today     Reviewed by Ethelene Herald, RN (Registered Nurse) on 02/06/24 at 1320  Med List Status: <None>   Medication Order Taking? Sig Documenting Provider Last Dose Status Informant  ADVAIR DISKUS 100-50 MCG/ACT AEPB [id number] Yes INHALE 1 DOSE BY MOUTH TWICE DAILY Rockney Cid, DO Taking Active   albuterol  (VENTOLIN  HFA) 108 (90 Base) MCG/ACT inhaler [id number] Yes INHALE 2 PUFFS  BY MOUTH EVERY 6 HOURS AS NEEDED FOR WHEEZING OR SHORTNESS OF Limmie Ren, DO Taking Active   ALPRAZolam  (XANAX ) 1 MG tablet [id number] Yes Take 1 mg by mouth 2 (two) times daily. [provider] Taking Active   amLODipine  (NORVASC ) 2.5 MG tablet [id number] Yes Take 1 tablet by mouth once daily Rockney Cid, DO Taking Active   ARIPiprazole  (ABILIFY ) 15 MG tablet [id number] Yes Take 15 mg by mouth every morning. [provider] Taking Active Self, Multiple Informants  benzonatate  (TESSALON ) 100 MG capsule [id number] Yes Take 1 capsule (100 mg total) by mouth 2 (two) times daily as needed for cough. Rockney Cid, DO Taking Active   clopidogrel  (PLAVIX ) 75 MG tablet [id number] Yes Take 75 mg by mouth daily. [provider] Taking Active Self, Multiple Informants  Continuous Glucose Receiver (FREESTYLE LIBRE 2 READER) DEVI [id number] No Patient lost her's  Patient not taking: Reported on 02/06/2024   Rockney Cid, DO Not Taking Active   Continuous Glucose Sensor (FREESTYLE LIBRE 2 SENSOR) [place] [id number] No USE ONE SENSOR EVERY 14 DAYS  Patient not taking: Reported on 02/06/2024   Bud Care,  Timoteo Force, DO Not Taking Active   diazepam  (VALIUM ) 10 MG tablet [id number] No Take 10 mg by mouth 3 (three) times daily.  Patient not taking: Reported on 12/01/2023   [provider] Not Taking Active Self, Multiple Informants           Med Note Guido Leeks, Guadelupe Leech Oct 10, 2022  6:19 PM)    doxycycline  (VIBRA -TABS) 100 MG tablet [id number] Yes Take 1 tablet (100 mg total) by mouth 2 (two) times daily for 7 days. Rockney Cid, DO Taking Active   furosemide  (LASIX ) 40 MG tablet [id number] Yes TAKE 1 TABLET BY MOUTH TWICE DAILY AS NEEDED FOR  EDEMA  (CAN  TAKE  AN  ADDITIONAL  TABLET  IN  THE  AFTERNOON) Rockney Cid, DO Taking Active   KLOR-CON  M20 20 MEQ tablet [id number] Yes TAKE 1 TABLET BY MOUTH TWICE DAILY FOR 3 DAYS,THEN 1 TABLET ONCE DAILY WITH  LASIX  Rockney Cid, DO Taking Active   lamoTRIgine  (LAMICTAL ) 100 MG tablet [id number] Yes Take 100 mg by mouth 2 (two) times daily. [provider] Taking Active   lisinopril  (ZESTRIL ) 10 MG tablet [id number] Yes Take 1 tablet by mouth once daily Rockney Cid, DO Taking Active   metFORMIN  (GLUCOPHAGE -XR) 500 MG 24 hr tablet [id number] Yes TAKE 1 TABLET BY MOUTH TWICE DAILY WITH A MEAL Rockney Cid, DO Taking Active   omeprazole  (PRILOSEC) 40 MG capsule [id number] Yes Take 1 capsule (40 mg total) by mouth daily. Rockney Cid, DO Taking Active   prazosin  (MINIPRESS ) 5 MG capsule [id number] Yes Take 5 mg by mouth at bedtime. [provider] Taking Active   predniSONE  (DELTASONE ) 20 MG tablet [id number] Yes Take 2 tablets (40 mg total) by mouth daily for 5 days. Rockney Cid, DO Taking Active   promethazine  (PHENERGAN ) 12.5 MG tablet [id number] Yes Take 1 tablet (12.5 mg total) by mouth every 8 (eight) hours as needed for nausea or vomiting. Rockney Cid, DO Taking Active   Semaglutide , 1 MG/DOSE, 4 MG/3ML SOPN [id number] Yes Inject 1 mg as directed once a week. Rockney Cid, DO Taking Active   traZODone  (DESYREL ) 150 MG tablet [id number] Yes Take by mouth at bedtime. [provider] Taking Active            Med Note Marrie Sizer, DAVINA E   Tue Dec 09, 2023  1:33 PM) Patient states she takes 200 mg at HS.   Vilazodone HCl (VIIBRYD) 40 MG TABS [id number] Yes Take 40 mg by mouth daily. [provider] Taking Active   Med List Note Merline Starr, CPhT 04/17/22 1014): Gertha Ku Belvia Boyer   [phone]             Recommendation:   Continue Current Plan of Care  Follow Up Plan:   Telephone follow up appointment date/time:  02/17/24 at 10:30am  Michele Ahle, RN, BSN   [facility] Health RN Care Manager Direct Dial: [phone]  Fax: [phone]

## 2024-02-13 ENCOUNTER — Ambulatory Visit: Payer: Self-pay

## 2024-02-13 NOTE — Telephone Encounter (Signed)
 FYI Only or Action Required?: Action required by provider  Patient was last seen in primary care on 02/04/2024 by Rockney Cid, DO. Called Nurse Triage reporting Hyperglycemia. Symptoms began several weeks ago. Interventions attempted: Prescription medications: Metformin  and Rest, hydration, or home remedies. Symptoms are: unchanged.  Triage Disposition: Call PCP Within 24 Hours  Patient/caregiver understands and will follow disposition?: Yes Copied from CRM [id number]. Topic: Clinical - Red Word Triage >> Feb 13, 2024  2:23 PM Stephanie [name] wrote: Red Word that prompted transfer to Nurse Triage: Hyperglycemia around 1:30p 278. Reason for Disposition  [1] Caller has NON-URGENT medication or insulin  pump question AND [2] triager unable to answer question  Answer Assessment - Initial Assessment Questions 1. BLOOD GLUCOSE: "What is your blood glucose level?"      278 2. ONSET: "When did you check the blood glucose?"     1:30 PM 3. USUAL RANGE: "What is your glucose level usually?" (e.g., usual fasting morning value, usual evening value)     120-140 4. KETONES: "Do you check for ketones (urine or blood test strips)?" If Yes, ask: "What does the test show now?"      Unable to be completed 5. TYPE 1 or 2:  "Do you know what type of diabetes you have?"  (e.g., Type 1, Type 2, Gestational; doesn't know)      Type 2  6. INSULIN : "Do you take insulin ?" "What type of insulin (s) do you use? What is the mode of delivery? (syringe, pen; injection or pump)?"       No insulin  7. DIABETES PILLS: "Do you take any pills for your diabetes?" If Yes, ask: "Have you missed taking any pills recently?"     Metformin  8. OTHER SYMPTOMS: "Do you have any symptoms?" (e.g., fever, frequent urination, difficulty breathing, dizziness, weakness, vomiting)     Reports nausea and vomiting but states her blood sugars are likely not related.   Blood sugar readings have been in the 200s for the past couple of weeks. Patient  reports being on steroids from last weekend to yesterday. Prednisone  40 mg Daily x 5 days.  Protocols used: Diabetes - High Blood Sugar-A-AH

## 2024-02-13 NOTE — Telephone Encounter (Signed)
 Pt states needs rx for trellegy states she is out of sample, also talked with her about sugar readings, she is taking meds.  States she was on prednisone  last week.  I told her that would do it and keep an eye on readings and hopefully will start trending down.

## 2024-02-15 ENCOUNTER — Other Ambulatory Visit: Payer: Self-pay | Admitting: Internal Medicine

## 2024-02-15 DIAGNOSIS — M7989 Other specified soft tissue disorders: Secondary | ICD-10-CM

## 2024-02-15 DIAGNOSIS — I10 Essential (primary) hypertension: Secondary | ICD-10-CM

## 2024-02-16 ENCOUNTER — Other Ambulatory Visit: Payer: Self-pay | Admitting: Internal Medicine

## 2024-02-16 DIAGNOSIS — E876 Hypokalemia: Secondary | ICD-10-CM

## 2024-02-16 DIAGNOSIS — J449 Chronic obstructive pulmonary disease, unspecified: Secondary | ICD-10-CM

## 2024-02-16 MED ORDER — TRELEGY ELLIPTA 100-62.5-25 MCG/ACT IN AEPB: 1.0000 | INHALATION_SPRAY | Freq: Every day | RESPIRATORY_TRACT | 11 refills | Status: DC

## 2024-02-17 ENCOUNTER — Encounter: Payer: Self-pay | Admitting: *Deleted

## 2024-02-17 ENCOUNTER — Ambulatory Visit: Payer: Self-pay

## 2024-02-17 ENCOUNTER — Other Ambulatory Visit: Payer: Self-pay

## 2024-02-17 ENCOUNTER — Other Ambulatory Visit: Payer: Self-pay | Admitting: *Deleted

## 2024-02-17 NOTE — Patient Instructions (Signed)
 Visit Information  Thank you for taking time to visit with me today. Please don't hesitate to contact me if I can be of assistance to you before our next scheduled appointment.  Your next care management appointment is by telephone on 02/19/24 at 12:00   Please call the care guide team at [phone] if you need to cancel, schedule, or reschedule an appointment.   Please call the [place]  National Suicide Prevention Lifeline: 517 079 9521 or TTY: 2708067388 TTY [phone]) to talk to a trained counselor call 911 if you are experiencing a Mental Health or Behavioral Health Crisis or need someone to talk to.  Michele Ahle, RN, BSN [place]  [facility] Health RN Care Manager Direct Dial: [phone]  Fax: [phone]

## 2024-02-17 NOTE — Telephone Encounter (Signed)
 Too soon for refill for Lisinopril .  Requested Prescriptions  Pending Prescriptions Disp Refills   furosemide  (LASIX ) 40 MG tablet [Pharmacy Med Name: Furosemide  40 MG Oral Tablet] 30 tablet 0    Sig: TAKE 1 TABLET BY MOUTH TWICE DAILY AS NEEDED FOR  EDEMA  (CAN  TAKE  AN  ADDITIONAL  TABLET  IN  THE  AFTERNOON)     Cardiovascular:  Diuretics - Loop Failed - 02/17/2024  8:56 AM      Failed - K in normal range and within 180 days    Potassium  Date Value Ref Range Status  06/09/2023 4.6 3.5 - 5.1 mmol/L Final  03/30/2014 3.7 3.5 - 5.1 mmol/L Final         Failed - Ca in normal range and within 180 days    Calcium   Date Value Ref Range Status  06/09/2023 9.7 8.9 - 10.3 mg/dL Final   Calcium , Total  Date Value Ref Range Status  03/30/2014 9.4 8.5 - 10.1 mg/dL Final   Calcium , Ion  Date Value Ref Range Status  03/07/2018 0.91 (L) 1.15 - 1.40 mmol/L Final         Failed - Na in normal range and within 180 days    Sodium  Date Value Ref Range Status  06/09/2023 137 135 - 145 mmol/L Final  06/27/2016 147 (H) 134 - 144 mmol/L Final  03/30/2014 135 (L) 136 - 145 mmol/L Final         Failed - Cr in normal range and within 180 days    Creat  Date Value Ref Range Status  05/19/2023 0.73 0.50 - 1.03 mg/dL Final   Creatinine, Ser  Date Value Ref Range Status  06/09/2023 0.78 0.44 - 1.00 mg/dL Final   Creatinine, Urine  Date Value Ref Range Status  06/30/2023 118 20 - 275 mg/dL Final         Failed - Cl in normal range and within 180 days    Chloride  Date Value Ref Range Status  06/09/2023 100 98 - 111 mmol/L Final  03/30/2014 105 98 - 107 mmol/L Final         Failed - Mg Level in normal range and within 180 days    Magnesium   Date Value Ref Range Status  11/20/2018 2.1 1.7 - 2.4 mg/dL Final    Comment:    Performed at [facility], [address] [place], [place] [address]         Passed - Last BP in normal range    BP Readings from Last 1 Encounters:   01/20/24 128/84         Passed - Valid encounter within last 6 months    Recent Outpatient Visits           1 week ago COPD exacerbation [facility])   [place] [facility] Rockney Cid, DO   4 weeks ago Type 2 diabetes mellitus with hyperglycemia, without long-term current use of insulin  [facility])   [facility]ney Cid, DO   2 months ago Type 2 diabetes mellitus with hyperglycemia, without long-term current use of insulin  [facility])   [place] Rockney Cid, DO       Future Appointments             In 2 weeks Rockney Cid, DO [place] [facility], [facility]             amLODipine  (NORVASC ) 2.5 MG tablet [  Pharmacy Med Name: amLODIPine  Besylate 2.5 MG Oral Tablet] 90 tablet 0    Sig: Take 1 tablet by mouth once daily     Cardiovascular: Calcium  Channel Blockers 2 Passed - 02/17/2024  8:56 AM      Passed - Last BP in normal range    BP Readings from Last 1 Encounters:  01/20/24 128/84         Passed - Last Heart Rate in normal range    Pulse Readings from Last 1 Encounters:  01/20/24 (!) 103         Passed - Valid encounter within last 6 months    Recent Outpatient Visits           1 week ago COPD exacerbation (HCC)   [place] Rockney Cid, DO   4 weeks ago Type 2 diabetes mellitus with hyperglycemia, without long-term current use of insulin  [facility])   [place] [facility] Rockney Cid, DO   2 months ago Type 2 diabetes mellitus with hyperglycemia, without long-term current use of insulin  [facility])   [place] [facility] Rockney Cid, DO       Future Appointments             In 2 weeks Rockney Cid, DO [place] [facility], [facility]             lisinopril  (ZESTRIL ) 10 MG tablet [Pharmacy Med Name: Lisinopril  10 MG Oral Tablet] 90 tablet 0    Sig: Take 1 tablet  by mouth once daily     Cardiovascular:  ACE Inhibitors Failed - 02/17/2024  8:56 AM      Failed - Cr in normal range and within 180 days    Creat  Date Value Ref Range Status  05/19/2023 0.73 0.50 - 1.03 mg/dL Final   Creatinine, Ser  Date Value Ref Range Status  06/09/2023 0.78 0.44 - 1.00 mg/dL Final   Creatinine, Urine  Date Value Ref Range Status  06/30/2023 118 20 - 275 mg/dL Final         Failed - K in normal range and within 180 days    Potassium  Date Value Ref Range Status  06/09/2023 4.6 3.5 - 5.1 mmol/L Final  03/30/2014 3.7 3.5 - 5.1 mmol/L Final         Passed - Patient is not pregnant      Passed - Last BP in normal range    BP Readings from Last 1 Encounters:  01/20/24 128/84         Passed - Valid encounter within last 6 months    Recent Outpatient Visits           1 week ago COPD exacerbation [facility])   [place] [facility] Rockney Cid, DO   4 weeks ago Type 2 diabetes mellitus with hyperglycemia, without long-term current use of insulin  [facility])   [facility] C[facility], DO   2 months ago Type 2 diabetes mellitus with hyperglycemia, without long-term current use of insulin  [facility])   [facility] Rockney Cid, DO       Future Appointments             In 2 weeks Rockney Cid, DO [place] [facility], [facility]

## 2024-02-17 NOTE — Telephone Encounter (Signed)
 Requested Prescriptions  Pending Prescriptions Disp Refills   potassium chloride  SA (KLOR-CON  M20) 20 MEQ tablet [Pharmacy Med Name: Potassium Chloride  Crys ER 20 MEQ Oral Tablet Extended Release] 90 tablet 0    Sig: TAKE ONE TABLET BY MOUTH TWICE DAILY FOR THREE DAYS. THEN TAKE ONE TABLET BY MOUTH ONCE DAILY WITH LASIX      Endocrinology:  Minerals - Potassium Supplementation Passed - 02/17/2024 10:25 AM      Passed - K in normal range and within 360 days    Potassium  Date Value Ref Range Status  06/09/2023 4.6 3.5 - 5.1 mmol/L Final  03/30/2014 3.7 3.5 - 5.1 mmol/L Final         Passed - Cr in normal range and within 360 days    Creat  Date Value Ref Range Status  05/19/2023 0.73 0.50 - 1.03 mg/dL Final   Creatinine, Ser  Date Value Ref Range Status  06/09/2023 0.78 0.44 - 1.00 mg/dL Final   Creatinine, Urine  Date Value Ref Range Status  06/30/2023 118 20 - 275 mg/dL Final         Passed - Valid encounter within last 12 months    Recent Outpatient Visits           1 week ago COPD exacerbation [facility])   [place] [facility] Rockney Cid, DO   4 weeks ago Type 2 diabetes mellitus with hyperglycemia, without long-term current use of insulin  [facility])   [place] [facility] Rock[facility] C[facility], DO   2 months ago Type 2 diabetes mellitus with hyperglycemia, without long-term current use of insulin  [facility])   [facility] Rockney Cid, DO       Future Appointments             In 2 weeks Rockney Cid, DO [place] [facility], [facility]

## 2024-02-17 NOTE — Patient Outreach (Signed)
 Complex Care Management   Visit Note  02/17/2024  Name:  Stephanie [name] MRN: [id number] DOB: Apr 18, 1966  Situation: Referral received for Complex Care Management related to diabetes and persistent cough. I obtained verbal consent from Patient.  Visit completed with Phoebe Breed on the phone  Background:   Past Medical History:  Diagnosis Date   Closed fracture of shaft of humerus 01/20/2017   Diabetes mellitus without complication (HCC)    Difficult intubation    Diverticulitis    Headache    Hepatitis C    treated and resolved   History of kidney stones    Hypertension    Hypotension 08/22/2018   IBS (irritable bowel syndrome)    Kidney stones    Sciatica    Sleep apnea    on cpap   Stroke (HCC) 2017   memory loss   Superficial venous thrombosis of arm, left 07/22/2016    Assessment: Patient Reported Symptoms:  Cognitive Cognitive Status: Alert and oriented to person, place, and time, Normal speech and language skills Cognitive/Intellectual Conditions Management [RPT]: None reported or documented in medical history or problem list   Health Maintenance Behaviors: Annual physical exam, Sleep adequate Healing Pattern: Unsure Health Facilitated by: Rest  Neurological Neurological Review of Symptoms: No symptoms reported    HEENT HEENT Symptoms Reported: Other: Other HEENT Symptoms/Conditions: post-nasal drainage HEENT Conditions:  (none) HEENT Management Strategies: Fluid modification, Medication therapy HEENT Self-Management Outcome: 4 (good) HEENT Comment: visit with urgent care scheduled for this afternoon at 4:00  (none)  Cardiovascular Cardiovascular Symptoms Reported: No symptoms reported Does patient have uncontrolled Hypertension?: No Cardiovascular Conditions: Hypertension Cardiovascular Management Strategies: Medication therapy, Routine screening Cardiovascular Self-Management Outcome: 4 (good)  Respiratory Respiratory Symptoms Reported: Productive  cough, Shortness of breath Other Respiratory Symptoms: continues to have cough and congestion with green phlegm. Respiratory Conditions: Sleep disordered breathing, Seasonal allergies Respiratory Self-Management Outcome: 3 (uncertain) Respiratory Comment: taking mucinex and tessalon  perles and has increased water  intake. Had video visit with PCP office and was given antibiotics and prednisone . Has completed those without improvement. Assisted with scheduling a visit at [facility] in [place] for 4:00 today.  Endocrine Is patient diabetic?: Yes Endocrine Conditions: Diabetes Endocrine Management Strategies: Medication therapy, Medical device, Routine screening, Diet modification Endocrine Self-Management Outcome: 4 (good) Endocrine Comment: Discsussed effect of illness and prednisone  on blood sugar management. 2nd staff message sent to PCP office regarding Freestyle Libre Sensor cost. Patient unable to afford the $90 cost. Asked if Prior Authorization has been completed? Coverage denial scanned into media tab on 01/02/24. Advised that Dexcom G6 or G7 is preferred under Medicaid through [facility]. Jerrilyn Moras is non-preferred and will cost more. Requested order for glucose monitor and supplies to use as a backup.  Gastrointestinal Gastrointestinal Symptoms Reported: Vomiting Additional Gastrointestinal Details: episodes of vomiting due to excessive coughing Gastrointestinal Conditions: Nausea, Vomiting Gastrointestinal Management Strategies: Fluid modification Gastrointestinal Self-Management Outcome: 3 (uncertain) Nutrition Risk Screen (CP): No indicators present  Genitourinary Genitourinary Symptoms Reported: No symptoms reported    Integumentary Integumentary Symptoms Reported: No symptoms reported    Musculoskeletal Musculoskelatal Symptoms Reviewed: No symptoms reported        Psychosocial Psychosocial Symptoms Reported: Not assessed            08/01/2023    9:32 AM   Depression screen PHQ 2/9  Decreased Interest 0  Down, Depressed, Hopeless 1  PHQ - 2 Score 1  Altered sleeping 0  Tired, decreased energy 0  Change in appetite 0  Feeling bad or failure about yourself  0  Trouble concentrating 0  Moving slowly or fidgety/restless 0  Suicidal thoughts 0  PHQ-9 Score 1  Difficult doing work/chores Not difficult at all    There were no vitals filed for this visit.  Medications Reviewed Today     Reviewed by Ethelene Herald, RN (Registered Nurse) on 02/17/24 at 1115  Med List Status: <None>   Medication Order Taking? Sig Documenting Provider Last Dose Status Informant  albuterol  (VENTOLIN  HFA) 108 (90 Base) MCG/ACT inhaler [id number] Yes INHALE 2 PUFFS BY MOUTH EVERY 6 HOURS AS NEEDED FOR WHEEZING OR SHORTNESS OF Limmie Ren, DO Taking Active   ALPRAZolam  (XANAX ) 1 MG tablet [id number] Yes Take 1 mg by mouth 2 (two) times daily. [provider] Taking Active   amLODipine  (NORVASC ) 2.5 MG tablet [id number] Yes Take 1 tablet by mouth once daily Rockney Cid, DO Taking Active   ARIPiprazole  (ABILIFY ) 15 MG tablet [id number] Yes Take 15 mg by mouth every morning. [provider] Taking Active Self, Multiple Informants  benzonatate  (TESSALON ) 100 MG capsule [id number] Yes Take 1 capsule (100 mg total) by mouth 2 (two) times daily as needed for cough. Rockney Cid, DO Taking Active   clopidogrel  (PLAVIX ) 75 MG tablet [id number] Yes Take 75 mg by mouth daily. [provider] Taking Active Self, Multiple Informants  Continuous Glucose Receiver (FREESTYLE LIBRE 2 READER) DEVI [id number] No Patient lost her's  Patient not taking: Reported on 09/16/2023   Rockney Cid, DO Not Taking Active   Continuous Glucose Sensor (FREESTYLE [place]bilt 2 SENSOR) [place] [id number] No USE ONE SENSOR EVERY 14 DAYS  Patient not taking: Reported on 02/17/2024   Rockney Cid, DO Not Taking Active   diazepam  (VALIUM ) 10 MG tablet  [id number] No Take 10 mg by mouth 3 (three) times daily.  Patient not taking: Reported on 12/01/2023   [provider] Not Taking Active Self, Multiple Informants           Med Note Guido Leeks, [place] Leech Oct 10, 2022  6:19 PM)    Fluticasone -Umeclidin-Vilant (TRELEGY ELLIPTA) 100-62.5-25 MCG/ACT AEPB [id number] Yes Inhale 1 puff into the lungs daily. Rockney Cid, DO Taking Active   furosemide  (LASIX ) 40 MG tablet [id number] Yes TAKE 1 TABLET BY MOUTH TWICE DAILY AS NEEDED FOR  EDEMA  (CAN  TAKE  AN  ADDITIONAL  TABLET  IN  THE  AFTERNOON) Rockney Cid, DO Taking Active   lamoTRIgine  (LAMICTAL ) 100 MG tablet [id number] Yes Take 100 mg by mouth 2 (two) times daily. [provider] Taking Active   lisinopril  (ZESTRIL ) 10 MG tablet [id number] Yes Take 1 tablet by mouth once daily Rockney Cid, DO Taking Active   metFORMIN  (GLUCOPHAGE -XR) 500 MG 24 hr tablet [id number] Yes TAKE 1 TABLET BY MOUTH TWICE DAILY WITH A MEAL Rockney Cid, DO Taking Active   omeprazole  (PRILOSEC) 40 MG capsule [id number] Yes Take 1 capsule (40 mg total) by mouth daily. Rockney Cid, DO Taking Active   potassium chloride  SA (KLOR-CON  M20) 20 MEQ tablet [id number] Yes TAKE ONE TABLET BY MOUTH TWICE DAILY FOR THREE DAYS. THEN TAKE ONE TABLET BY MOUTH ONCE DAILY WITH LASIX  Rockney Cid, DO Taking Active   prazosin  (MINIPRESS ) 5 MG capsule [id number] Yes Take 5 mg by mouth at bedtime. [provider] Taking Active   promethazine  (PHENERGAN ) 12.5 MG tablet [id number] Yes Take 1 tablet (12.5 mg total) by mouth every 8 (eight) hours as  needed for nausea or vomiting. Rockney Cid, DO Taking Active   Semaglutide , 1 MG/DOSE, 4 MG/3ML SOPN [id number] Yes Inject 1 mg as directed once a week. Rockney Cid, DO Taking Active   traZODone  (DESYREL ) 150 MG tablet [id number] Yes Take by mouth at bedtime. [provider] Taking Active            Med Note Marrie Sizer, DAVINA E   Tue  Dec 09, 2023  1:33 PM) Patient states she takes 200 mg at HS.   Vilazodone HCl (VIIBRYD) 40 MG TABS [id number] Yes Take 40 mg by mouth daily. [provider] Taking Active   Med List Note Merline Starr, CPhT 04/17/22 1014): Gertha Ku Belvia Boyer   [phone]             Recommendation:   PCP Follow-up Urgent Care today for persistent cough  Follow Up Plan:   Telephone follow up appointment date/time:  02/19/24 at 12:00  Michele Ahle, RN, BSN [place]  [facility] RN Care Manager Direct Dial: [phone]  Fax: [phone]

## 2024-02-17 NOTE — Telephone Encounter (Signed)
 Requested medication (s) are due for refill today: yes  Requested medication (s) are on the active medication list: yes  Last refill:  12/12/23  Future visit scheduled: yes  Notes to clinic:  Unable to refill per protocol due to failed labs, no updated results.      Requested Prescriptions  Pending Prescriptions Disp Refills   furosemide  (LASIX ) 40 MG tablet [Pharmacy Med Name: Furosemide  40 MG Oral Tablet] 30 tablet 0    Sig: TAKE 1 TABLET BY MOUTH TWICE DAILY AS NEEDED FOR  EDEMA  (CAN  TAKE  AN  ADDITIONAL  TABLET  IN  THE  AFTERNOON)     Cardiovascular:  Diuretics - Loop Failed - 02/17/2024  8:57 AM      Failed - K in normal range and within 180 days    Potassium  Date Value Ref Range Status  06/09/2023 4.6 3.5 - 5.1 mmol/L Final  03/30/2014 3.7 3.5 - 5.1 mmol/L Final         Failed - Ca in normal range and within 180 days    Calcium   Date Value Ref Range Status  06/09/2023 9.7 8.9 - 10.3 mg/dL Final   Calcium , Total  Date Value Ref Range Status  03/30/2014 9.4 8.5 - 10.1 mg/dL Final   Calcium , Ion  Date Value Ref Range Status  03/07/2018 0.91 (L) 1.15 - 1.40 mmol/L Final         Failed - Na in normal range and within 180 days    Sodium  Date Value Ref Range Status  06/09/2023 137 135 - 145 mmol/L Final  06/27/2016 147 (H) 134 - 144 mmol/L Final  03/30/2014 135 (L) 136 - 145 mmol/L Final         Failed - Cr in normal range and within 180 days    Creat  Date Value Ref Range Status  05/19/2023 0.73 0.50 - 1.03 mg/dL Final   Creatinine, Ser  Date Value Ref Range Status  06/09/2023 0.78 0.44 - 1.00 mg/dL Final   Creatinine, Urine  Date Value Ref Range Status  06/30/2023 118 20 - 275 mg/dL Final         Failed - Cl in normal range and within 180 days    Chloride  Date Value Ref Range Status  06/09/2023 100 98 - 111 mmol/L Final  03/30/2014 105 98 - 107 mmol/L Final         Failed - Mg Level in normal range and within 180 days    Magnesium   Date  Value Ref Range Status  11/20/2018 2.1 1.7 - 2.4 mg/dL Final    Comment:    Performed at [facility], [address] [place], [place] [address]         Passed - Last BP in normal range    BP Readings from Last 1 Encounters:  01/20/24 128/84         Passed - Valid encounter within last 6 months    Recent Outpatient Visits           1 week ago COPD exacerbation [facility])   [facility] Rockney Cid, DO   4 weeks ago Type 2 diabetes mellitus with hyperglycemia, without long-term current use of insulin  [facility])   [facility] Rockney Cid, DO   2 months ago Type 2 diabetes mellitus with hyperglycemia, without long-term current use of insulin  [facility])   [facility] Rockney Cid, [place]  Future Appointments             In 2 weeks Rockney Cid, DO [facility], [facility]            Signed Prescriptions Disp Refills   amLODipine  (NORVASC ) 2.5 MG tablet 90 tablet 0    Sig: Take 1 tablet by mouth once daily     Cardiovascular: Calcium  Channel Blockers 2 Passed - 02/17/2024  8:57 AM      Passed - Last BP in normal range    BP Readings from Last 1 Encounters:  01/20/24 128/84         Passed - Last Heart Rate in normal range    Pulse Readings from Last 1 Encounters:  01/20/24 (!) 103         Passed - Valid encounter within last 6 months    Recent Outpatient Visits           1 week ago COPD exacerbation (HCC)   [place] [facility] Rockney Cid, DO   4 weeks ago Type 2 diabetes mellitus with hyperglycemia, without long-term current use of insulin  [facility])   [place] [facility] Rockney Cid, DO   2 months ago Type 2 diabetes mellitus with hyperglycemia, without long-term current use of insulin  [facility])   [place] [facility] Rockney Cid, DO       Future Appointments              In 2 weeks Rockney Cid, DO [place] [facility], [facility]            Refused Prescriptions Disp Refills   lisinopril  (ZESTRIL ) 10 MG tablet [Pharmacy Med Name: Lisinopril  10 MG Oral Tablet] 90 tablet 0    Sig: Take 1 tablet by mouth once daily     Cardiovascular:  ACE Inhibitors Failed - 02/17/2024  8:57 AM      Failed - Cr in normal range and within 180 days    Creat  Date Value Ref Range Status  05/19/2023 0.73 0.50 - 1.03 mg/dL Final   Creatinine, Ser  Date Value Ref Range Status  06/09/2023 0.78 0.44 - 1.00 mg/dL Final   Creatinine, Urine  Date Value Ref Range Status  06/30/2023 118 20 - 275 mg/dL Final         Failed - K in normal range and within 180 days    Potassium  Date Value Ref Range Status  06/09/2023 4.6 3.5 - 5.1 mmol/L Final  03/30/2014 3.7 3.5 - 5.1 mmol/L Final         Passed - Patient is not pregnant      Passed - Last BP in normal range    BP Readings from Last 1 Encounters:  01/20/24 128/84         Passed - Valid encounter within last 6 months    Recent Outpatient Visits           1 week ago COPD exacerbation [facility])   [place] [facility] Rockney Cid, DO   4 weeks ago Type 2 diabetes mellitus with hyperglycemia, without long-term current use of insulin  [facility])   [facility] Rockney Cid, DO   2 months ago Type 2 diabetes mellitus with hyperglycemia, without long-term current use of insulin  [facility])   [facility] Rockney Cid, DO       Future Appointments  In 2 weeks Rockney Cid, DO [place] [facility], [facility]

## 2024-02-19 ENCOUNTER — Encounter: Payer: Self-pay | Admitting: *Deleted

## 2024-02-19 ENCOUNTER — Other Ambulatory Visit: Payer: Self-pay | Admitting: *Deleted

## 2024-02-19 DIAGNOSIS — Z419 Encounter for procedure for purposes other than remedying health state, unspecified: Secondary | ICD-10-CM | POA: Diagnosis not present

## 2024-02-22 ENCOUNTER — Other Ambulatory Visit: Payer: Self-pay | Admitting: Internal Medicine

## 2024-02-22 DIAGNOSIS — E1165 Type 2 diabetes mellitus with hyperglycemia: Secondary | ICD-10-CM

## 2024-02-23 ENCOUNTER — Other Ambulatory Visit: Payer: Self-pay | Admitting: Internal Medicine

## 2024-02-23 DIAGNOSIS — E1165 Type 2 diabetes mellitus with hyperglycemia: Secondary | ICD-10-CM

## 2024-02-24 ENCOUNTER — Telehealth: Payer: Self-pay | Admitting: Internal Medicine

## 2024-02-24 NOTE — Telephone Encounter (Signed)
 Copied from CRM [id number]. Topic: General - Other >> Feb 24, 2024  2:14 PM Stephanie [name] wrote: Reason for CRM: Pt called stated returning Helen's call regarding medication change. Please call pt at [phone]

## 2024-02-24 NOTE — Telephone Encounter (Signed)
 Requested Prescriptions  Pending Prescriptions Disp Refills   Continuous Glucose Sensor (FREESTYLE LIBRE 2 SENSOR) MISC [Pharmacy Med Name: FREESTYLE LIBRE 2 SEN 14D KIT] 4 each 2    Sig: USE 1 SENSOR EVERY 14 DAYS     Endocrinology: Diabetes - Testing Supplies Passed - 02/24/2024  4:11 PM      Passed - Valid encounter within last 12 months    Recent Outpatient Visits           2 weeks ago COPD exacerbation [facility])   [place] [facility] Rockney Cid, DO   1 month ago Type 2 diabetes mellitus with hyperglycemia, without long-term current use of insulin  [facility])   [place] [facility] Rockney Cid, DO   2 months ago Type 2 diabetes mellitus with hyperglycemia, without long-term current use of insulin  [facility])   [facility] Rockney Cid, DO       Future Appointments             In 1 week Rockney Cid, DO [place] [facility], [facility]

## 2024-02-25 ENCOUNTER — Telehealth: Payer: Self-pay

## 2024-02-25 ENCOUNTER — Other Ambulatory Visit (HOSPITAL_COMMUNITY): Payer: Self-pay

## 2024-02-25 NOTE — Telephone Encounter (Signed)
 Continuous Glucose Sensor (FREESTYLE LIBRE 2 SENSOR) MISC 4 each 2 02/24/2024 --   Sig: USE 1 SENSOR EVERY 14 DAYS   Sent to pharmacy as: Continuous Glucose Sensor (FREESTYLE LIBRE 2 SENSOR) Misc   E-Prescribing Status: Receipt confirmed by pharmacy (02/24/2024  4:12 PM EDT)    Requested Prescriptions  Pending Prescriptions Disp Refills   Continuous Glucose Sensor (FREESTYLE LIBRE 2 SENSOR) MISC [Pharmacy Med Name: FREESTYLE LIBRE 2 SEN 14D KIT] 2 each 0    Sig: USE ONE SENSOR EVERY 14 DAYS     Endocrinology: Diabetes - Testing Supplies Passed - 02/25/2024  4:52 PM      Passed - Valid encounter within last 12 months    Recent Outpatient Visits           3 weeks ago COPD exacerbation [facility])   [place] [facility] Rockney Cid, DO   1 month ago Type 2 diabetes mellitus with hyperglycemia, without long-term current use of insulin  [facility])   [place] Rockney Cid, DO   2 months ago Type 2 diabetes mellitus with hyperglycemia, without long-term current use of insulin  [facility])   [facility] Rockney Cid, DO       Future Appointments             In 1 week Rockney Cid, DO [place] [facility], [facility]

## 2024-02-25 NOTE — Telephone Encounter (Signed)
 Copied from CRM [id number]. Topic: General - Call Back - No Documentation >> Feb 25, 2024  9:23 AM Zipporah Him wrote: Reason for CRM: Patient states she missed a call from [facility], just returning the call. CAL advised Adriana Hopping is busy and will call back when she is available. Patient understood.

## 2024-02-25 NOTE — Telephone Encounter (Signed)
 Did you call her? No Mary in this office

## 2024-02-29 IMAGING — CT CT HEAD W/O CM
4 series · 16 of 47 positions shown, 18 images · non-contrast
Comparison: CT brain 10/31/2021; MRI brain 07/16/2021

CLINICAL DATA: Head trauma. Abnormal mental status. Left-sided head
pain after falling yesterday.



[Series 3: head wo · axial · 0.47mm/px · z∈[-122,-2]mm · 7 of 33 slices shown, 9 images]
[im 5/33  brain]
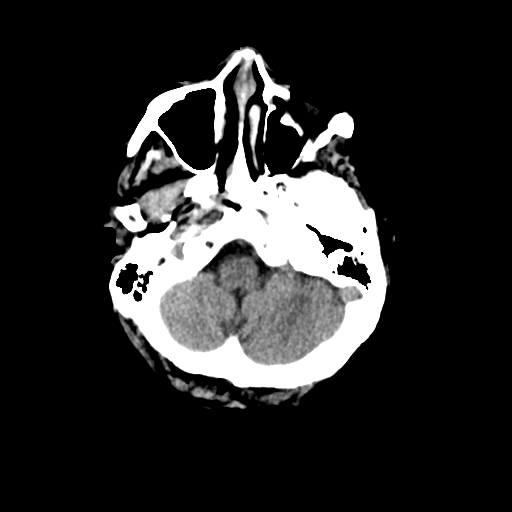
[im 5/33  bone]
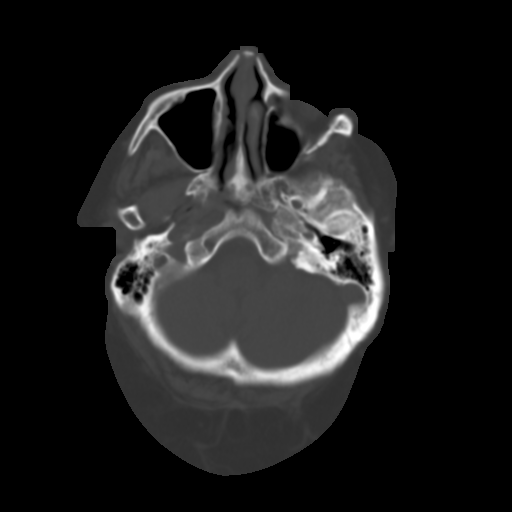
[im 9/33  brain]
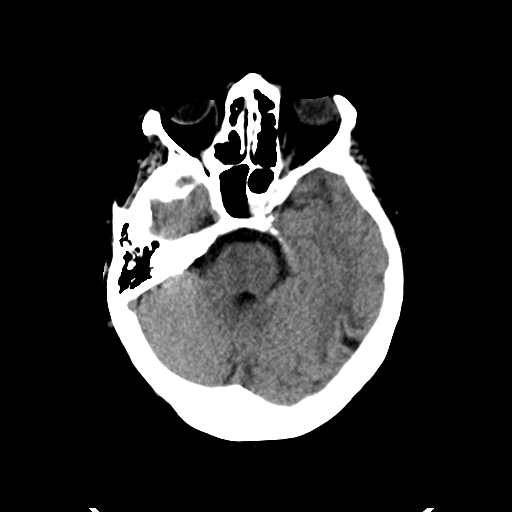
[im 13/33  brain]
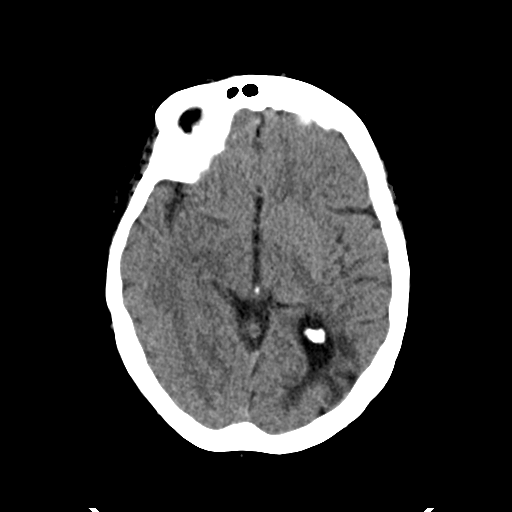
[im 17/33  brain]
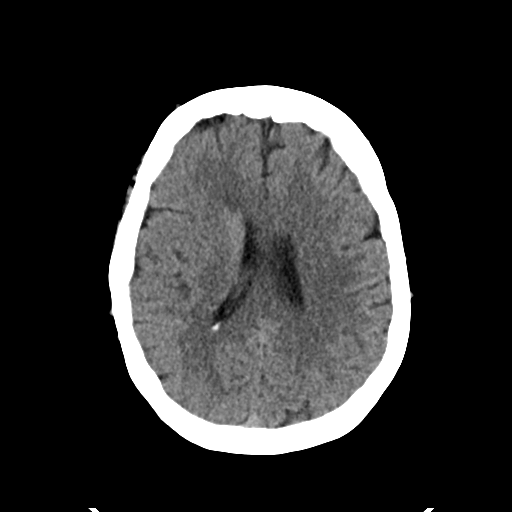
[im 21/33  brain]
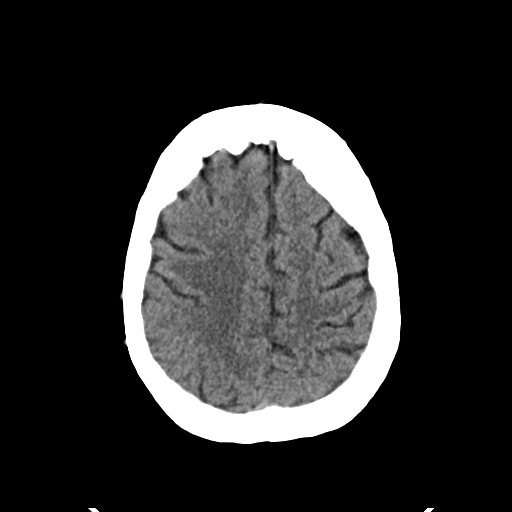
[im 21/33  bone]
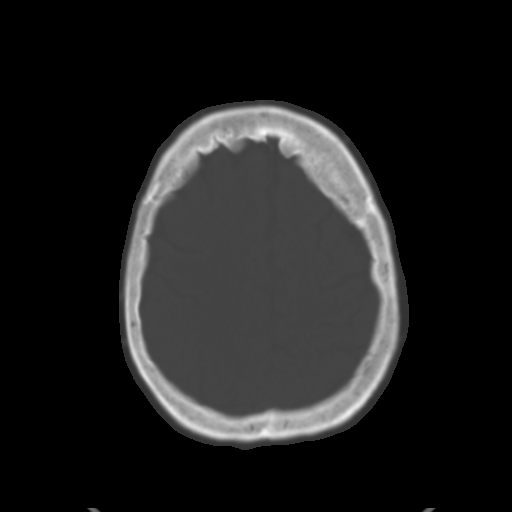
[im 25/33  brain]
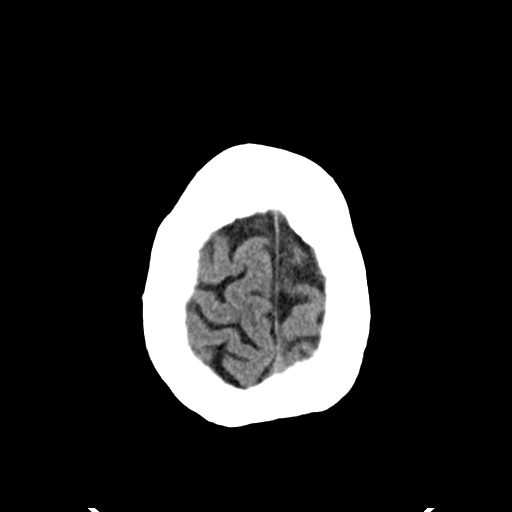
[im 29/33  brain]
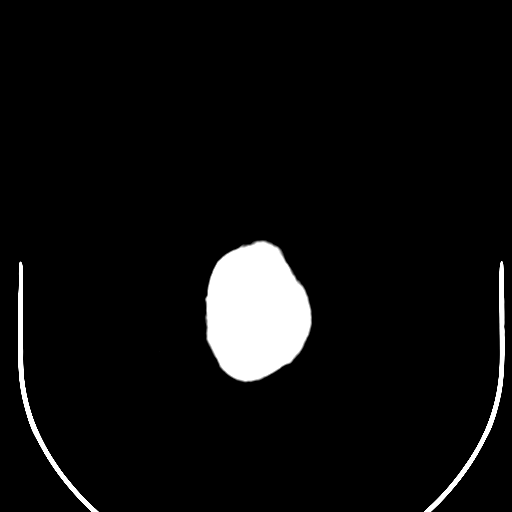

[Series 4: head bone · axial · 0.47mm/px · z∈[-126,-94]mm · 3 of 82 slices shown]
[im 9/82  bone]
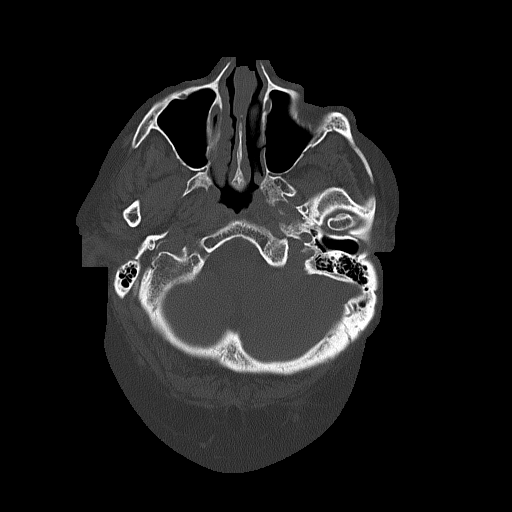
[im 17/82  bone]
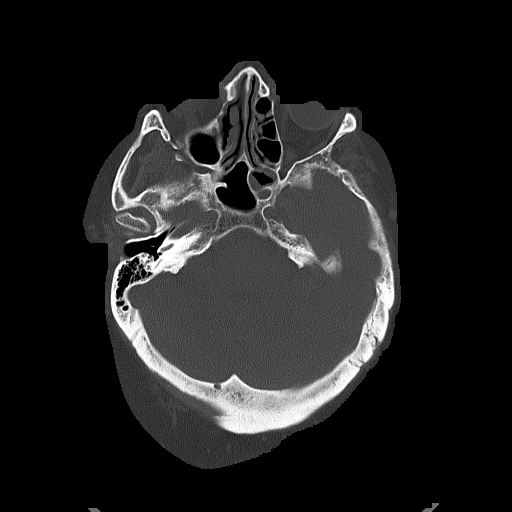
[im 25/82  bone]
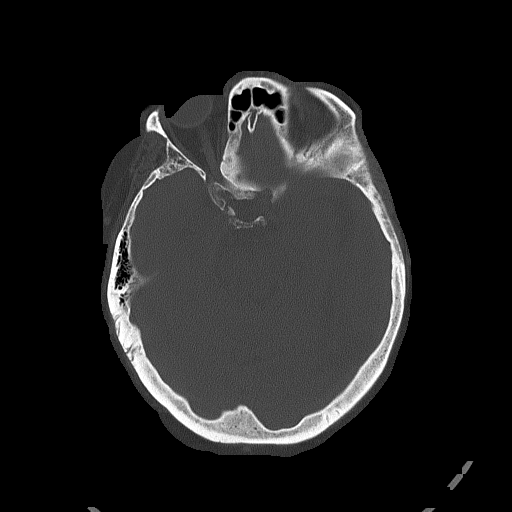

[Series 5: sagittal soft tissue · sagittal · 0.31mm/px · 3 of 60 slices shown]
[im 20/60  brain]
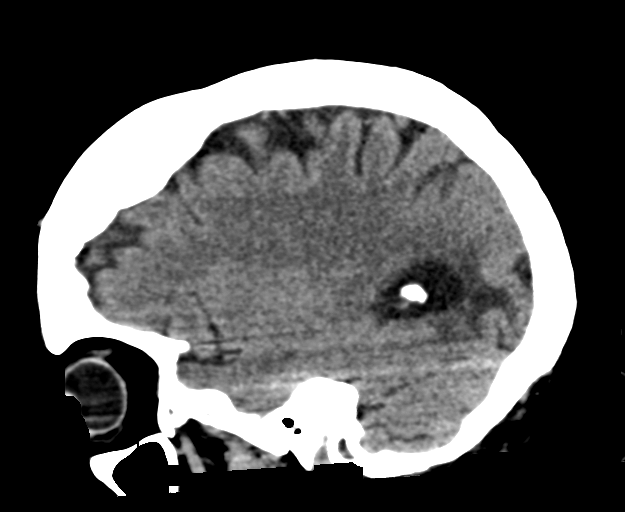
[im 30/60  brain]
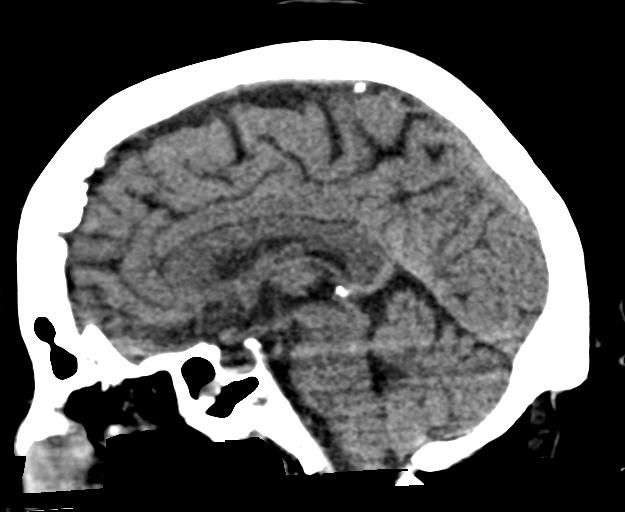
[im 40/60  brain]
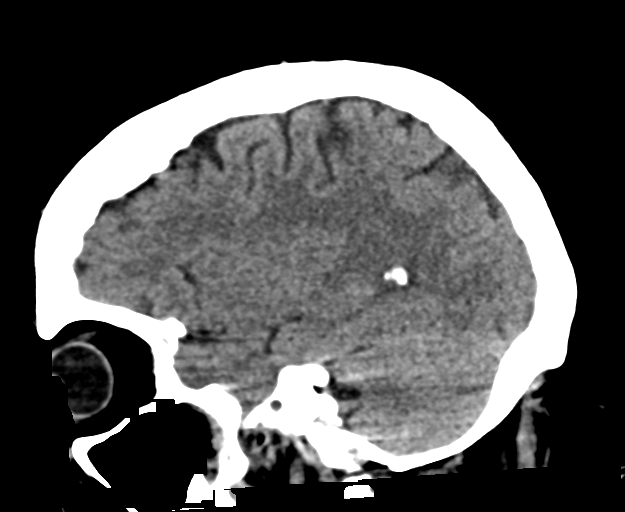

[Series 6: coronal soft tissue · coronal · 0.31mm/px · 3 of 65 slices shown]
[im 22/65  brain]
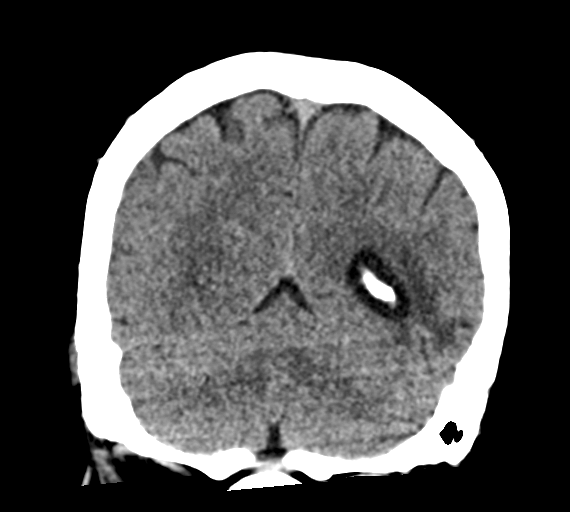
[im 29/65  brain]
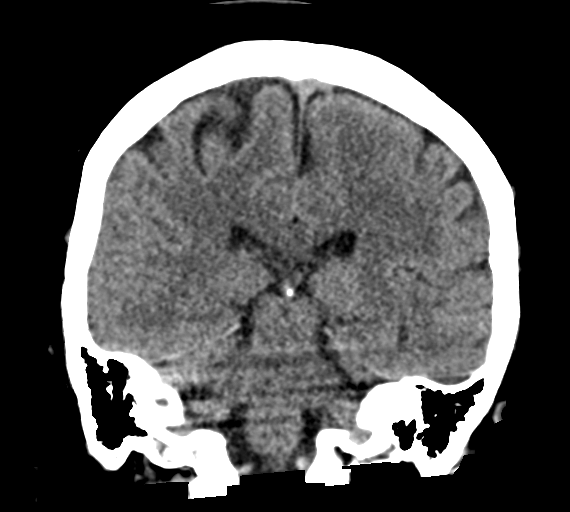
[im 36/65  brain]
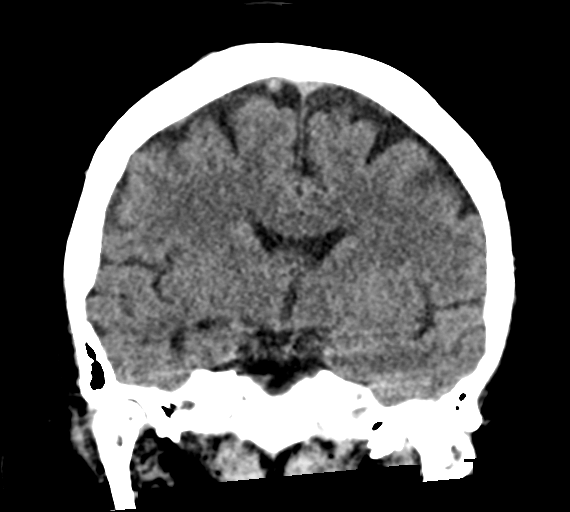

[16 of 47 positions shown; findings below may reference images not displayed]

FINDINGS: Brain: There is mild cortical atrophy, unchanged from prior and
within normal limits for patient age. The ventricles are normal in
configuration. The basilar cisterns are patent.

No significant change in left temporal-occipital volume loss and
encephalomalacia from remote infarct.

No acute intracranial hemorrhage is seen.

No abnormal extra-axial fluid collection.

Preservation of the normal cortical gray-white interface without CT
evidence of an acute major vascular territorial cortical based
infarction.

Vascular: No hyperdense vessel or unexpected calcification.

Skull: Normal. Negative for fracture or focal lesion.

Sinuses/Orbits: The visualized orbits are unremarkable.
Redemonstration of prior right maxillary antrostomy. Mild peripheral
mucosal thickening throughout the ethmoid air cells overall with
similar sinus opacification compared to prior. Mild left sphenoid
sinus peripheral mucosal thickening is new from prior. The
visualized mastoid air cells are clear.

Other: None.
IMPRESSION: 1. No significant change compared to 10/31/2021.
2. Old left temporal-occipital infarct.
3. No acute intracranial process.

## 2024-03-01 DIAGNOSIS — J441 Chronic obstructive pulmonary disease with (acute) exacerbation: Secondary | ICD-10-CM | POA: Diagnosis not present

## 2024-03-01 DIAGNOSIS — B9789 Other viral agents as the cause of diseases classified elsewhere: Secondary | ICD-10-CM | POA: Diagnosis not present

## 2024-03-01 DIAGNOSIS — F17210 Nicotine dependence, cigarettes, uncomplicated: Secondary | ICD-10-CM | POA: Diagnosis not present

## 2024-03-01 DIAGNOSIS — J988 Other specified respiratory disorders: Secondary | ICD-10-CM | POA: Diagnosis not present

## 2024-03-01 DIAGNOSIS — R053 Chronic cough: Secondary | ICD-10-CM | POA: Diagnosis not present

## 2024-03-02 ENCOUNTER — Other Ambulatory Visit: Payer: Self-pay | Admitting: Internal Medicine

## 2024-03-02 DIAGNOSIS — J449 Chronic obstructive pulmonary disease, unspecified: Secondary | ICD-10-CM

## 2024-03-02 NOTE — Telephone Encounter (Unsigned)
 Copied from CRM [id number]. Topic: Clinical - Medication Refill >> Mar 02, 2024  8:33 AM Emylou G wrote: Medication: Fluticasone -Umeclidin-Vilant (TRELEGY ELLIPTA ) 100-62.5-25 MCG/ACT AEPB  Has the patient contacted their pharmacy? Yes (Agent: If no, request that the patient contact the pharmacy for the refill. If patient does not wish to contact the pharmacy document the reason why and proceed with request.) (Agent: If yes, when and what did the pharmacy advise?) waiting on us   This is the patient's preferred pharmacy:  [facility] [address], [place] - [address] [place] [address] Phone: [phone] Fax: [phone]   Is this the correct pharmacy for this prescription? Yes If no, delete pharmacy and type the correct one.   Has the prescription been filled recently? no  Is the patient out of the medication? Yes  Has the patient been seen for an appointment in the last year OR does the patient have an upcoming appointment? Yes  Can we respond through MyChart? No  Agent: Please be advised that Rx refills may take up to 3 business days. We ask that you follow-up with your pharmacy.

## 2024-03-03 NOTE — Telephone Encounter (Signed)
 Too soon for refill.  Requested Prescriptions  Pending Prescriptions Disp Refills   Fluticasone -Umeclidin-Vilant (TRELEGY ELLIPTA ) 100-62.5-25 MCG/ACT AEPB 1 each 11    Sig: Inhale 1 puff into the lungs daily.     Off-Protocol Failed - 03/03/2024  3:20 PM      Failed - Medication not assigned to a protocol, review manually.      Passed - Valid encounter within last 12 months    Recent Outpatient Visits           4 weeks ago COPD exacerbation [facility])   [place] [facility] Bernardo Fend, DO   1 month ago Type 2 diabetes mellitus with hyperglycemia, without long-term current use of insulin  [facility])   [facility]end, DO   3 months ago Type 2 diabetes mellitus with hyperglycemia, without long-term current use of insulin  [facility])   [facility] Bernardo Fend, DO       Future Appointments             In 5 days Bernardo Fend, DO [facility], [facility]

## 2024-03-08 ENCOUNTER — Ambulatory Visit: Admitting: Internal Medicine

## 2024-03-09 ENCOUNTER — Other Ambulatory Visit (HOSPITAL_COMMUNITY): Payer: Self-pay

## 2024-03-10 ENCOUNTER — Other Ambulatory Visit (HOSPITAL_COMMUNITY): Payer: Self-pay

## 2024-03-10 ENCOUNTER — Telehealth: Payer: Self-pay | Admitting: Pharmacy Technician

## 2024-03-10 ENCOUNTER — Other Ambulatory Visit: Payer: Self-pay

## 2024-03-10 DIAGNOSIS — E1165 Type 2 diabetes mellitus with hyperglycemia: Secondary | ICD-10-CM

## 2024-03-10 MED ORDER — DEXCOM G7 RECEIVER DEVI: 0 refills | Status: DC

## 2024-03-10 MED ORDER — DEXCOM G7 SENSOR MISC: 1 refills | Status: DC

## 2024-03-10 NOTE — Telephone Encounter (Signed)
 Pharmacy Patient Advocate Encounter   Received notification from Onbase that prior authorization for Dexcom G7 Sensor is required/requested.   Insurance verification completed.   The patient is insured through [facility] [place] .   Per test claim: PA required; PA submitted to above mentioned insurance via CoverMyMeds Key/confirmation #/EOC [id number] Status is pending

## 2024-03-10 NOTE — Telephone Encounter (Signed)
 Pharmacy Patient Advocate Encounter   Received notification from Onbase that prior authorization for Dexcom G7 Receiver device is required/requested.   Insurance verification completed.   The patient is insured through [facility]  [place] .   Per test claim: PA required; PA submitted to above mentioned insurance via CoverMyMeds Key/confirmation #/EOC [facility] Status is pending

## 2024-03-10 NOTE — Telephone Encounter (Signed)
 Pharmacy Patient Advocate Encounter  Received notification from [facility] Medicaid that Prior Authorization for [facility] G7 Receiver device has been APPROVED from 02/25/24 to 09/06/24. Ran test claim, Copay is $0.00. This test claim was processed through [facility]- copay amounts may vary at other pharmacies due to pharmacy/plan contracts, or as the patient moves through the different stages of their insurance plan.   PA #/Case ID/Reference #: [id number]

## 2024-03-10 NOTE — Telephone Encounter (Signed)
 Pharmacy Patient Advocate Encounter  Received notification from [facility] Medicaid that Prior Authorization for Dexcom G7 Sensor has been APPROVED from 02/25/24 to 09/06/24   PA #/Case ID/Reference #: [id number]

## 2024-03-19 IMAGING — CT CT ABD-PELV W/O CM
2 of 4 series · 16 of 46 positions shown, 18 images · non-contrast
Comparison: 10/31/2021

CLINICAL DATA: Abdominal pain right lower quadrant, right flank



[Series 2: axial st · axial · 0.94mm/px · z∈[-281,+139]mm · 13 of 96 slices shown, 15 images]
[im 6/96  soft-tissue]
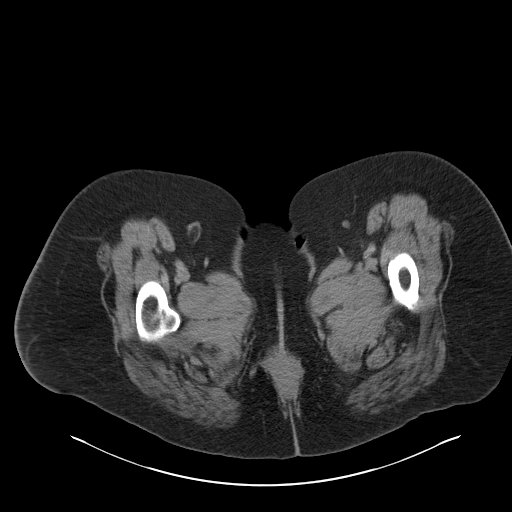
[im 6/96  bone]
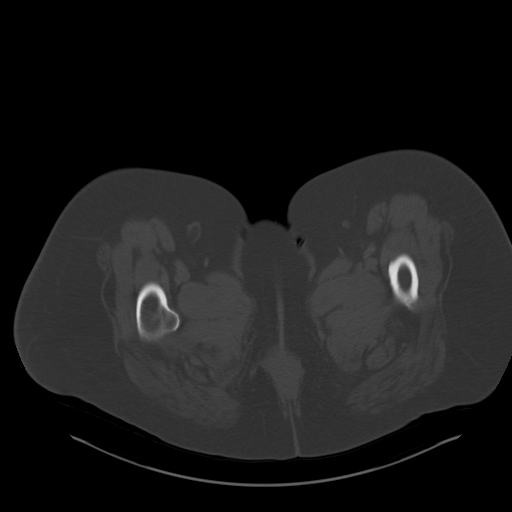
[im 12/96  soft-tissue]
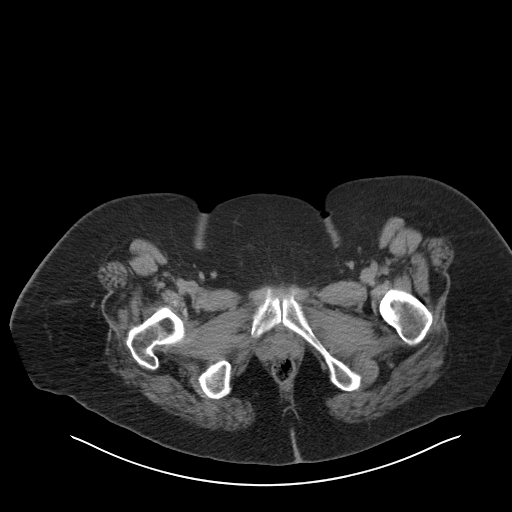
[im 23/96  soft-tissue]
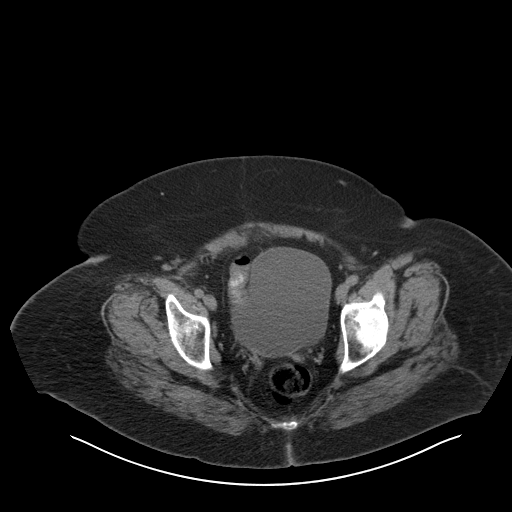
[im 28/96  soft-tissue]
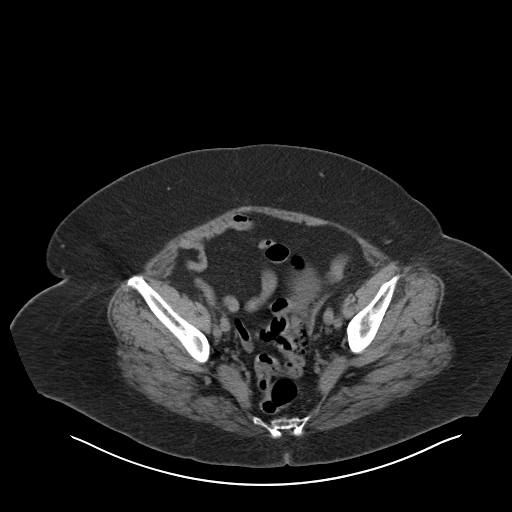
[im 34/96  soft-tissue]
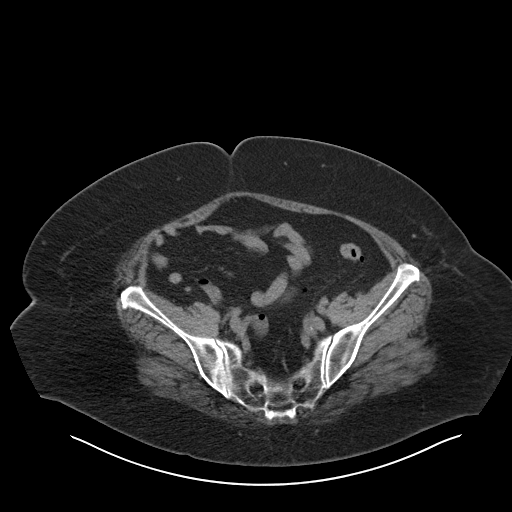
[im 40/96  soft-tissue]
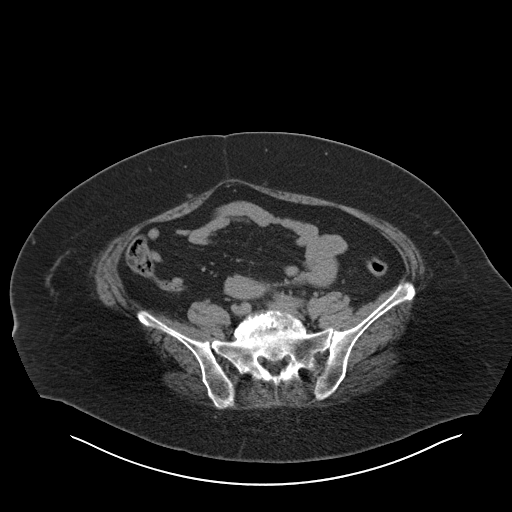
[im 51/96  soft-tissue]
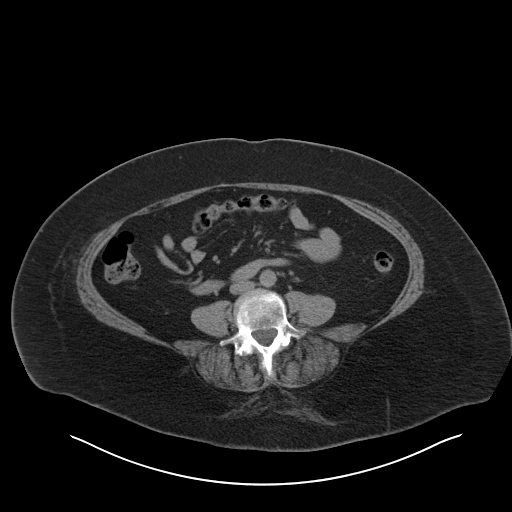
[im 56/96  soft-tissue]
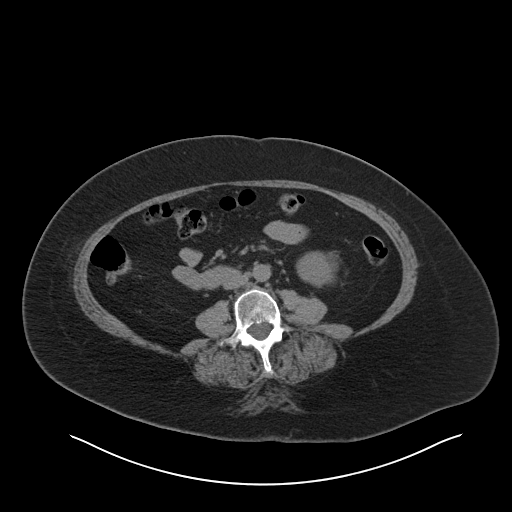
[im 62/96  soft-tissue]
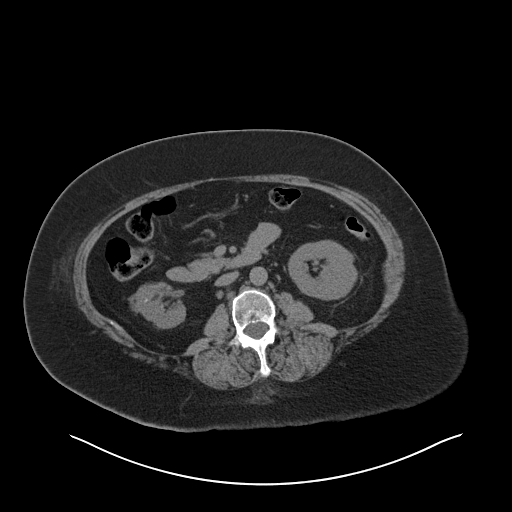
[im 62/96  bone]
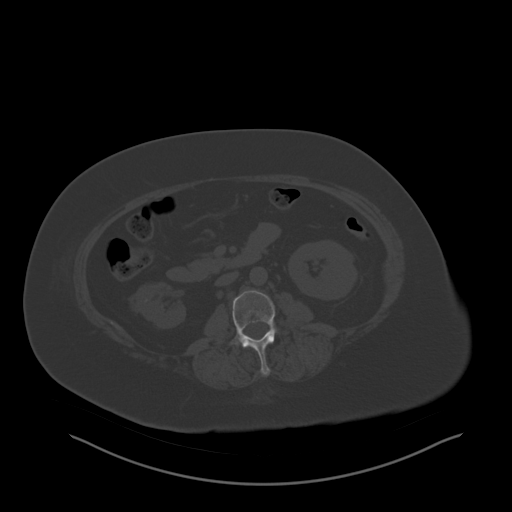
[im 68/96  soft-tissue]
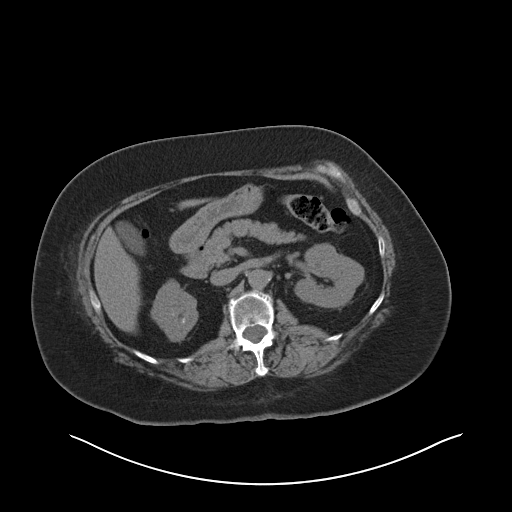
[im 73/96  soft-tissue]
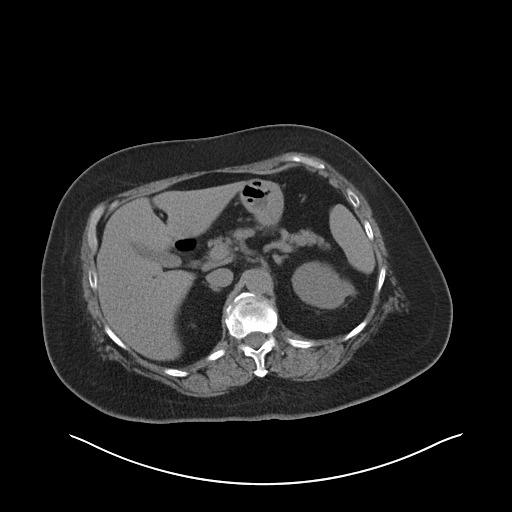
[im 84/96  soft-tissue]
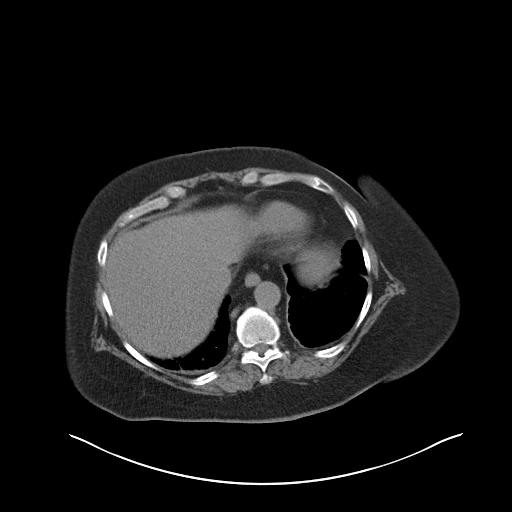
[im 90/96  soft-tissue]
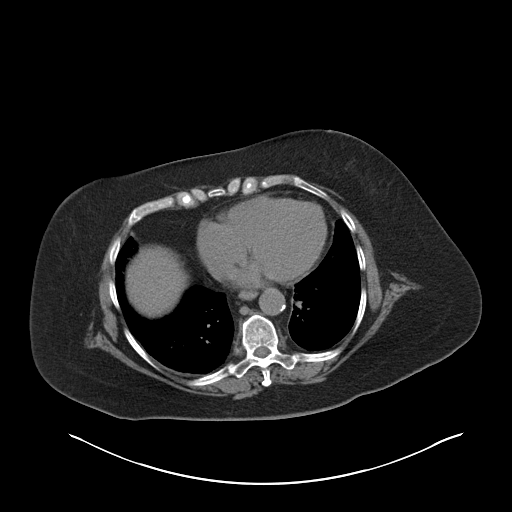

[Series 4: coronal st · coronal · 0.90mm/px · 3 of 159 slices shown]
[im 53/159  soft-tissue]
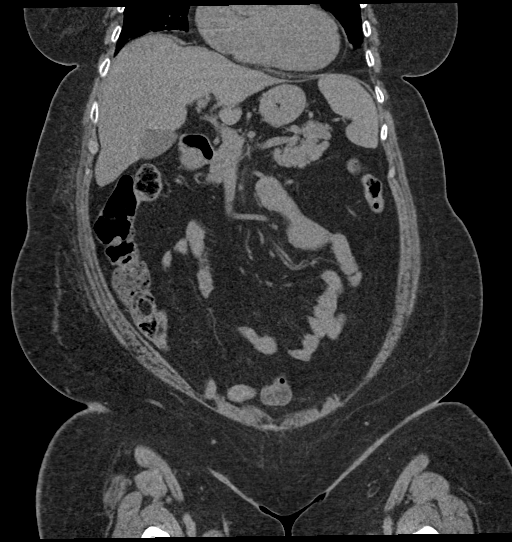
[im 71/159  soft-tissue]
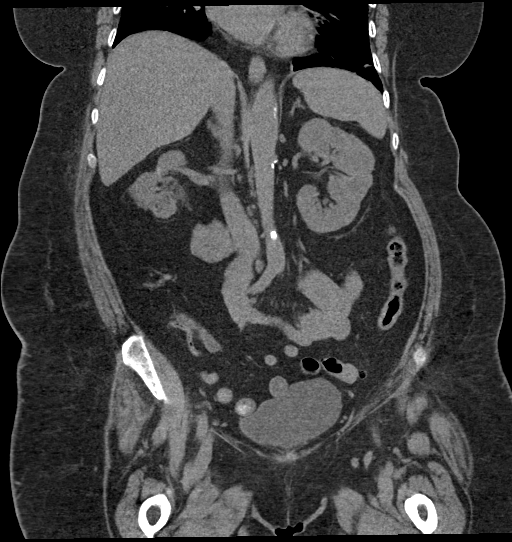
[im 88/159  soft-tissue]
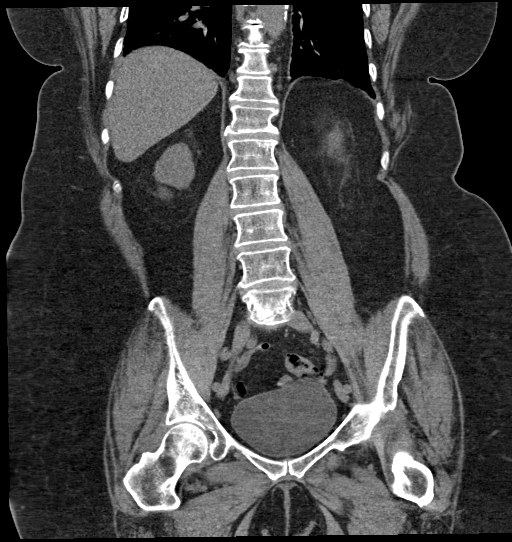

[16 of 46 positions shown; findings below may reference images not displayed]

FINDINGS: Lower chest: Aortic Atherosclerosis (VABKI-170.0). No pleural or
pericardial effusion.

Hepatobiliary: No focal liver abnormality is seen. No gallstones,
gallbladder wall thickening, or biliary dilatation.

Pancreas: Unremarkable. No pancreatic ductal dilatation or
surrounding inflammatory changes.

Spleen: Normal in size without focal abnormality.

Adrenals/Urinary Tract: No adrenal mass. Bilateral renal
calcifications more numerous right than left. Largest calcification
on the right peripherally in the upper pole 6 mm, on the left in the
upper pole 5 mm. Lobular right renal contour as before. No
hydronephrosis. Urinary bladder physiologically distended.

Stomach/Bowel: Stomach and small bowel unremarkable. Normal
appendix. The colon is incompletely distended, unremarkable.

Vascular/Lymphatic: Aortic Atherosclerosis (VABKI-170.0). No
abdominal or pelvic adenopathy.

Reproductive: Status post hysterectomy. No adnexal masses.

Other: Left pelvic phlebolith.  No ascites.  No free air.

Musculoskeletal: Lumbar spondylitic change most marked L5-S1. No
acute findings.
IMPRESSION: 1. Bilateral nonobstructive urolithiasis.
2.  Aortic Atherosclerosis (VABKI-170.0).

## 2024-03-20 DIAGNOSIS — Z419 Encounter for procedure for purposes other than remedying health state, unspecified: Secondary | ICD-10-CM | POA: Diagnosis not present

## 2024-03-22 ENCOUNTER — Other Ambulatory Visit (HOSPITAL_COMMUNITY): Payer: Self-pay

## 2024-03-22 ENCOUNTER — Telehealth: Payer: Self-pay | Admitting: Pharmacy Technician

## 2024-03-22 NOTE — Telephone Encounter (Signed)
 Pharmacy Patient Advocate Encounter  Received notification from [facility] Medicaid that Prior Authorization for Trelegy Ellipta  100-62.5-25MCG/ACT aerosol powder  has been APPROVED from 03/08/24 to 03/22/25. Ran test claim, Copay is $4.00. This test claim was processed through [facility]- copay amounts may vary at other pharmacies due to pharmacy/plan contracts, or as the patient moves through the different stages of their insurance plan.   PA #/Case ID/Reference #: [id number]

## 2024-03-22 NOTE — Telephone Encounter (Signed)
 Pharmacy Patient Advocate Encounter   Received notification from Onbase that prior authorization for Trelegy Ellipta  100-62.5-25MCG/ACT aerosol powder is required/requested.   Insurance verification completed.   The patient is insured through [facility] [place] .   Per test claim: PA required; PA submitted to above mentioned insurance via CoverMyMeds Key/confirmation #/EOC [id number] Status is pending

## 2024-03-30 ENCOUNTER — Ambulatory Visit: Admitting: Internal Medicine

## 2024-03-30 DIAGNOSIS — Z7689 Persons encountering health services in other specified circumstances: Secondary | ICD-10-CM | POA: Diagnosis not present

## 2024-04-06 ENCOUNTER — Other Ambulatory Visit: Payer: Self-pay | Admitting: Internal Medicine

## 2024-04-06 DIAGNOSIS — F17218 Nicotine dependence, cigarettes, with other nicotine-induced disorders: Secondary | ICD-10-CM | POA: Diagnosis not present

## 2024-04-06 DIAGNOSIS — J449 Chronic obstructive pulmonary disease, unspecified: Secondary | ICD-10-CM | POA: Diagnosis not present

## 2024-04-06 DIAGNOSIS — I10 Essential (primary) hypertension: Secondary | ICD-10-CM

## 2024-04-07 NOTE — Telephone Encounter (Signed)
 Requested medication (s) are due for refill today:   Yes  Requested medication (s) are on the active medication list:   Yes  Future visit scheduled:   Yes 8/8   Last ordered: 01/13/2024 #90, 0 refills  Unable to refill because labs are due per protocol    Requested Prescriptions  Pending Prescriptions Disp Refills   lisinopril  (ZESTRIL ) 10 MG tablet [Pharmacy Med Name: Lisinopril  10 MG Oral Tablet] 90 tablet 0    Sig: Take 1 tablet by mouth once daily     Cardiovascular:  ACE Inhibitors Failed - 04/07/2024 11:04 AM      Failed - Cr in normal range and within 180 days    Creat  Date Value Ref Range Status  05/19/2023 0.73 0.50 - 1.03 mg/dL Final   Creatinine, Ser  Date Value Ref Range Status  06/09/2023 0.78 0.44 - 1.00 mg/dL Final   Creatinine, Urine  Date Value Ref Range Status  06/30/2023 118 20 - 275 mg/dL Final         Failed - K in normal range and within 180 days    Potassium  Date Value Ref Range Status  06/09/2023 4.6 3.5 - 5.1 mmol/L Final  03/30/2014 3.7 3.5 - 5.1 mmol/L Final         Passed - Patient is not pregnant      Passed - Last BP in normal range    BP Readings from Last 1 Encounters:  01/20/24 128/84         Passed - Valid encounter within last 6 months    Recent Outpatient Visits           2 months ago COPD exacerbation [facility])   [facility] Bernardo Fend, DO   2 months ago Type 2 diabetes mellitus with hyperglycemia, without long-term current use of insulin  [facility])   [facility] Bernardo Fend, DO   4 months ago Type 2 diabetes mellitus with hyperglycemia, without long-term current use of insulin  [facility])   [facility] Bernardo Fend, [place]

## 2024-04-09 DIAGNOSIS — F4312 Post-traumatic stress disorder, chronic: Secondary | ICD-10-CM | POA: Diagnosis not present

## 2024-04-09 DIAGNOSIS — F411 Generalized anxiety disorder: Secondary | ICD-10-CM | POA: Diagnosis not present

## 2024-04-09 DIAGNOSIS — F319 Bipolar disorder, unspecified: Secondary | ICD-10-CM | POA: Diagnosis not present

## 2024-04-09 DIAGNOSIS — F410 Panic disorder [episodic paroxysmal anxiety] without agoraphobia: Secondary | ICD-10-CM | POA: Diagnosis not present

## 2024-04-13 NOTE — Progress Notes (Signed)
 This encounter was created in error - please disregard.

## 2024-04-16 ENCOUNTER — Encounter: Payer: Self-pay | Admitting: Internal Medicine

## 2024-04-16 ENCOUNTER — Ambulatory Visit (INDEPENDENT_AMBULATORY_CARE_PROVIDER_SITE_OTHER): Admitting: Internal Medicine

## 2024-04-16 ENCOUNTER — Other Ambulatory Visit: Payer: Self-pay

## 2024-04-16 ENCOUNTER — Other Ambulatory Visit (HOSPITAL_COMMUNITY): Payer: Self-pay

## 2024-04-16 VITALS — BP 130/80 | HR 100 | Temp 98.1°F | Resp 18 | Ht 62.0 in | Wt 187.7 lb

## 2024-04-16 DIAGNOSIS — I10 Essential (primary) hypertension: Secondary | ICD-10-CM

## 2024-04-16 DIAGNOSIS — Z7689 Persons encountering health services in other specified circumstances: Secondary | ICD-10-CM | POA: Diagnosis not present

## 2024-04-16 DIAGNOSIS — E1165 Type 2 diabetes mellitus with hyperglycemia: Secondary | ICD-10-CM | POA: Diagnosis not present

## 2024-04-16 DIAGNOSIS — Z7985 Long-term (current) use of injectable non-insulin antidiabetic drugs: Secondary | ICD-10-CM | POA: Diagnosis not present

## 2024-04-16 MED ORDER — SEMAGLUTIDE (2 MG/DOSE) 8 MG/3ML ~~LOC~~ SOPN: 2.0000 mg | PEN_INJECTOR | SUBCUTANEOUS | 1 refills | Status: DC

## 2024-04-16 MED ORDER — AMLODIPINE BESYLATE 2.5 MG PO TABS: 2.5000 mg | ORAL_TABLET | Freq: Every day | ORAL | 1 refills | Status: DC

## 2024-04-16 MED ORDER — METFORMIN HCL ER 500 MG PO TB24: 500.0000 mg | ORAL_TABLET | Freq: Two times a day (BID) | ORAL | 1 refills | Status: DC

## 2024-04-16 NOTE — Progress Notes (Signed)
 Established Patient Office Visit  Subjective   [name]: Stephanie [name], female    DOB: 07-Mar-1966  Age: 58 y.o. MRN: [id number]  Chief Complaint  Patient presents with   Medical Management of Chronic Issues    Patient is here for follow up on chronic medical conditions.   Discussed the use of AI scribe software for clinical note transcription with the patient, who gave verbal consent to proceed.  History of Present Illness Stephanie [name] is a 59 year old female with diabetes who presents for management of her blood sugar levels.  She experiences fluctuations in blood sugar levels, averaging 165 mg/dL, with a recent J8r of 2.6%. She had two episodes of hyperglycemia with levels reaching 303 mg/dL. Her fasting blood sugar is typically 71 mg/dL. She takes Ozempic , possibly at 2 mg, though prescribed 1 mg, and metformin  twice daily.  She has noticed bruising, possibly from bumping into things, with no use of aspirin or other medications that could contribute. She has lost weight from 238 pounds to 187 pounds since March, attributing this to her medication regimen.  Transportation issues have affected her ability to schedule a mammogram ordered in January. She lives 12-13 minutes from the clinic, and drivers have difficulty locating her residence.   Hypertension: -Medications: Lisinopril  10 mg, Amlodipine  2.5 mg. No longer taking Lasix , swelling resolved.  -Patient is compliant with above medications and reports no side effects. -Denies any SOB, CP, vision changes, LE edema or symptoms of hypotension -Diet: eating less calories  -Exercise: recently working out at [employer], lost 11 pounds  Diabetes, Type 2: -Last A1c 8.3% 5/25 -Medications: Ozempic  increased to 1 mg at LOV, Metformin  500 mg BID -Patient is compliant with the above medications and reports no side effects. -Checking BG at home: 165-300, higher lately  -Eye exam: Due - patient will call to schedule -Foot  exam: UTD -Microalbumin: UTD -Statin: uncertain if taking -PNA vaccine: UTD -Denies symptoms of hypoglycemia, polyuria, polydipsia, numbness extremities, foot ulcers/trauma.  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   Bipolar disorder (HCC) 05/09/2016   Hyperlipidemia, unspecified 05/09/2016   Irritable bowel syndrome 05/09/2016   Lumbago with sciatica 05/09/2016   Esophageal reflux 05/09/2016   Scoliosis 05/09/2016   Diverticulosis of colon 05/09/2016   DDD (degenerative disc disease), cervical 05/09/2016   ICH (intracerebral hemorrhage) (HCC)    Primary hypertension 04/19/2016   Cytotoxic brain edema (HCC) 04/19/2016   IVH (intraventricular hemorrhage) (HCC) 04/18/2016   Bilateral carpal tunnel syndrome 04/12/2016   Falls frequently 01/12/2016   Trigeminal neuralgia 06/08/2015   Sacroiliac dysfunction 03/04/2014   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 or radiculopathy 05/20/2013   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   Tobacco use disorder 09/11/2012   History of cervical cancer 07/30/2012   Hepatitis C    Past Medical History:  Diagnosis Date   Closed fracture of shaft of humerus 01/20/2017   Diabetes mellitus without complication (HCC)    Difficult intubation    Diverticulitis    Headache  Hepatitis C    treated and resolved   History of kidney stones    Hypertension    Hypotension 08/22/2018   IBS (irritable bowel syndrome)    Kidney stones    Sciatica    Sleep apnea     on cpap   Stroke (HCC) 2017   memory loss   Superficial venous thrombosis of arm, left 07/22/2016   Past Surgical History:  Procedure Laterality Date   ABDOMINAL HYSTERECTOMY  2007   carpel tunnel syndrome  2017   HARDWARE REMOVAL Right 12/22/2017   Procedure: HARDWARE REMOVAL-RIGHT ARM;  Surgeon: Leora Lynwood SAUNDERS, MD;  Location: [facility];  Service: Orthopedics;  Laterality: Right;  Removal of implant, superficial right humerus   HUMERUS IM NAIL Right 03/11/2017   Procedure: INTRAMEDULLARY (IM) NAIL HUMERAL;  Surgeon: Leora Lynwood SAUNDERS, MD;  Location: [facility];  Service: Orthopedics;  Laterality: Right;   KIDNEY STONE SURGERY Right 02/12/2012   KIDNEY STONE SURGERY Left 07/15/2012   LIGATION OF ARTERIOVENOUS  FISTULA Left 06/21/2016   Procedure: LIGATION OF ARTERIOVENOUS  FISTULA ( LIGATION BASILIC VEIN );  Surgeon: Cordella KANDICE Shawl, MD;  Location: [facility];  Service: Vascular;  Laterality: Left;   LITHOTRIPSY     OOPHORECTOMY     SINUS EXPLORATION     TONSILLECTOMY     Social History   Tobacco Use   Smoking status: Every Day    Current packs/day: 1.00    Average packs/day: 1 pack/day for 1.5 years (1.5 ttl pk-yrs)    Types: Cigarettes   Smokeless tobacco: Never  Vaping Use   Vaping status: Never Used  Substance Use Topics   Alcohol use: Not Currently    Comment: no alcohol since 2002   Drug use: No   Social History   Socioeconomic History   Marital status: Legally Separated    Spouse name: Not on file   Number of children: Not on file   Years of education: Not on file   Highest education level: Not on file  Occupational History   Not on file  Tobacco Use   Smoking status: Every Day    Current packs/day: 1.00    Average packs/day: 1 pack/day for 1.5 years (1.5 ttl pk-yrs)    Types: Cigarettes   Smokeless tobacco: Never  Vaping Use   Vaping status: Never Used  Substance and Sexual Activity   Alcohol use: Not Currently    Comment: no alcohol since 2002   Drug  use: No   Sexual activity: Not on file  Other Topics Concern   Not on file  Social History Narrative   Not on file   Social Drivers of Health   Financial Resource Strain: Low Risk  (02/05/2024)   Received from [facility]   Overall Financial Resource Strain (CARDIA)    Difficulty of Paying Living Expenses: Not very hard  Food Insecurity: No Food Insecurity (02/05/2024)   Received from [facility]   Hunger Vital Sign    Within the past 12 months, you worried that your food would run out before you got the money to buy more.: Never true    Within the past 12 months, the food you bought just didn't last and you didn't have money to get more.: Never true  Transportation Needs: No Transportation Needs (02/17/2024)   PRAPARE - Administrator, Civil Service (Medical): No    Lack of Transportation (Non-Medical): No  Recent Concern: Transportation Needs - Unmet  Transportation Needs (02/05/2024)   Received from [facility] - Transportation    In the past 12 months, has lack of transportation kept you from medical appointments or from getting medications?: Yes    Lack of Transportation (Non-Medical): No  Physical Activity: Not on file  Stress: Not on file  Social Connections: Not on file  Intimate Partner Violence: Not At Risk (12/31/2023)   Humiliation, Afraid, Rape, and Kick questionnaire    Fear of Current or Ex-Partner: No    Emotionally Abused: No    Physically Abused: No    Sexually Abused: No   Family Status  Relation Name Status   Mother  Deceased   Father  Alive   Sister  Deceased  No partnership data on file   Family History  Problem Relation Age of Onset   Heart failure Mother    Diabetes Father    Cancer Sister    Allergies  Allergen Reactions   Gabapentin  Itching   Ibuprofen  Itching   Sulfa  Antibiotics Hives   Tylenol  [Acetaminophen ] Itching   Aspirin Itching   Buprenorphine Hcl Itching    Carbamazepine Itching, Other (See Comments) and Rash    Makes her feel like something is crawling under her skin.   Codeine Itching and Other (See Comments)    Makes her feel like something is crawling under her skin. Can take, sometimes makes her itch   Compazine  Itching and Other (See Comments)    makes my skin crawl   Elemental Sulfur  Hives, Rash and Other (See Comments)    Skin Rashes, Hives   Ioxaglate Itching   Ivp Dye [Iodinated Contrast Media] Itching and Other (See Comments)    Makes her itch really bad. Had to get 2-3 shots of Benadryl  when she was in the hospital. Patient states the IVP dye does not cause her any allergy problems or reactions.    Metrizamide Itching   Naproxen Itching   Norco [Hydrocodone -Acetaminophen ] Itching   Other Itching and Other (See Comments)    Makes her itch really bad. Had to get 2-3 shots of Benadryl  when she was in the hospital.   Penicillin G Hives, Rash and Other (See Comments)    Has patient had a PCN reaction causing immediate rash, facial/tongue/throat swelling, SOB or lightheadedness with hypotension: Yes Has patient had a PCN reaction causing severe rash involving mucus membranes or skin necrosis: No Has patient had a PCN reaction that required hospitalization: No Has patient had a PCN reaction occurring within the last 10 years: No If all of the above answers are NO, then may proceed with Cephalosporin use.    Prochlorperazine  Maleate Other (See Comments)    Compazine  makes her skin crawl - needs 2-3 shots of Benadryl  to get relief.   Reglan  [Metoclopramide ] Itching and Other (See Comments)    Makes her skin crawl   Toradol  [Ketorolac  Tromethamine ] Itching   Tramadol  Itching   Zofran  [Ondansetron  Hcl] Itching      Review of Systems  All other systems reviewed and are negative.     Objective:     BP 130/80 (Cuff Size: Large)   Pulse 100   Temp 98.1 F (36.7 C) (Oral)   Resp 18   Ht 5' 2 (1.575 m)   Wt 187 lb 11.2 oz  (85.1 kg)   SpO2 99%   BMI 34.33 kg/m  BP Readings from Last 3 Encounters:  04/16/24 130/80  01/20/24 128/84  12/01/23 138/86   Wt  Readings from Last 3 Encounters:  04/16/24 187 lb 11.2 oz (85.1 kg)  01/20/24 203 lb 1.6 oz (92.1 kg)  12/01/23 209 lb 12.8 oz (95.2 kg)      Physical Exam Constitutional:      Appearance: Normal appearance.  HENT:     Head: Normocephalic and atraumatic.  Eyes:     Conjunctiva/sclera: Conjunctivae normal.  Cardiovascular:     Rate and Rhythm: Normal rate and regular rhythm.  Pulmonary:     Effort: Pulmonary effort is normal.     Breath sounds: Normal breath sounds.  Skin:    General: Skin is warm and dry.     Comments: Hematomas along arms  Neurological:     General: No focal deficit present.     Mental Status: She is alert. Mental status is at baseline.  Psychiatric:        Mood and Affect: Mood normal.        Behavior: Behavior normal.      No results found for any visits on 04/16/24.   Last CBC Lab Results  Component Value Date   WBC 7.7 06/09/2023   HGB 13.7 06/09/2023   HCT 41.4 06/09/2023   MCV 90.6 06/09/2023   MCH 30.0 06/09/2023   RDW 12.7 06/09/2023   PLT 219 06/09/2023   Last metabolic panel Lab Results  Component Value Date   GLUCOSE 310 (H) 06/09/2023   NA 137 06/09/2023   K 4.6 06/09/2023   CL 100 06/09/2023   CO2 23 06/09/2023   BUN 11 06/09/2023   CREATININE 0.78 06/09/2023   GFRNONAA >60 06/09/2023   CALCIUM  9.7 06/09/2023   PHOS 2.3 (L) 08/22/2018   PROT 8.0 05/29/2023   ALBUMIN 4.2 05/29/2023   BILITOT 0.4 05/29/2023   ALKPHOS 98 05/29/2023   AST 27 05/29/2023   ALT 34 05/29/2023   ANIONGAP 14 06/09/2023   Last lipids Lab Results  Component Value Date   CHOL 123 02/13/2022   HDL 73 02/13/2022   LDLCALC 35 02/13/2022   TRIG 69 02/13/2022   CHOLHDL 1.7 02/13/2022   Last hemoglobin A1c Lab Results  Component Value Date   HGBA1C 8.3 (A) 01/20/2024   Last thyroid  functions Lab Results   Component Value Date   TSH 1.218 04/16/2022   Last vitamin D  No results found for: MARIEN BOLLS, VD25OH Last vitamin B12 and Folate Lab Results  Component Value Date   VITAMINB12 380 02/20/2022      The ASCVD Risk score (Arnett DK, et al., 2019) failed to calculate for the following reasons:   Risk score cannot be calculated because patient has a medical history suggesting prior/existing ASCVD    Assessment & Plan:   Assessment & Plan Type 2 diabetes mellitus Type 2 diabetes mellitus with elevated blood glucose levels. Blood glucose average is 165 mg/dL, with highs up to 696 mg/dL. Weight loss from 238 lbs to 187 lbs noted. - Increase Ozempic  to 2 mg. - Continue metformin  twice daily. - Consider SGLT2 inhibitors if further glucose control is needed and insurance allows. - Schedule follow-up in two months for A1c and other labs.  Hypertension Hypertension well-controlled at 130/80 mmHg. Current medications include amlodipine  and lisinopril . - Continue amlodipine  and lisinopril . - Discontinue Lasix . - Refill amlodipine  prescription.  Bruising secondary to antiplatelet therapy Bruising likely due to Plavix  use, necessary for cardiac health. - Continue Plavix .  - amLODipine  (NORVASC ) 2.5 MG tablet; Take 1 tablet (2.5 mg total) by mouth daily.  Dispense: 90 tablet;  Refill: 1 - Semaglutide , 2 MG/DOSE, 8 MG/3ML SOPN; Inject 2 mg as directed once a week.  Dispense: 3 mL; Refill: 1 - metFORMIN  (GLUCOPHAGE -XR) 500 MG 24 hr tablet; Take 1 tablet (500 mg total) by mouth 2 (two) times daily with a meal.  Dispense: 180 tablet; Refill: 1   Return in about 2 months (around 06/16/2024).    Sharyle Fischer, DO

## 2024-04-20 ENCOUNTER — Encounter: Payer: Self-pay | Admitting: Internal Medicine

## 2024-04-20 DIAGNOSIS — Z419 Encounter for procedure for purposes other than remedying health state, unspecified: Secondary | ICD-10-CM | POA: Diagnosis not present

## 2024-05-21 DIAGNOSIS — Z419 Encounter for procedure for purposes other than remedying health state, unspecified: Secondary | ICD-10-CM | POA: Diagnosis not present

## 2024-05-23 DIAGNOSIS — R109 Unspecified abdominal pain: Secondary | ICD-10-CM | POA: Diagnosis not present

## 2024-05-23 DIAGNOSIS — Z86718 Personal history of other venous thrombosis and embolism: Secondary | ICD-10-CM | POA: Diagnosis not present

## 2024-05-23 DIAGNOSIS — R1032 Left lower quadrant pain: Secondary | ICD-10-CM | POA: Diagnosis not present

## 2024-05-23 DIAGNOSIS — Z882 Allergy status to sulfonamides status: Secondary | ICD-10-CM | POA: Diagnosis not present

## 2024-05-23 DIAGNOSIS — I10 Essential (primary) hypertension: Secondary | ICD-10-CM | POA: Diagnosis not present

## 2024-05-23 DIAGNOSIS — Z7984 Long term (current) use of oral hypoglycemic drugs: Secondary | ICD-10-CM | POA: Diagnosis not present

## 2024-05-23 DIAGNOSIS — R1031 Right lower quadrant pain: Secondary | ICD-10-CM | POA: Diagnosis not present

## 2024-05-23 DIAGNOSIS — N200 Calculus of kidney: Secondary | ICD-10-CM | POA: Diagnosis not present

## 2024-05-23 DIAGNOSIS — J449 Chronic obstructive pulmonary disease, unspecified: Secondary | ICD-10-CM | POA: Diagnosis not present

## 2024-05-23 DIAGNOSIS — N3000 Acute cystitis without hematuria: Secondary | ICD-10-CM | POA: Diagnosis not present

## 2024-05-23 DIAGNOSIS — Z79899 Other long term (current) drug therapy: Secondary | ICD-10-CM | POA: Diagnosis not present

## 2024-05-23 DIAGNOSIS — F4310 Post-traumatic stress disorder, unspecified: Secondary | ICD-10-CM | POA: Diagnosis not present

## 2024-05-23 DIAGNOSIS — E119 Type 2 diabetes mellitus without complications: Secondary | ICD-10-CM | POA: Diagnosis not present

## 2024-05-23 NOTE — ED Provider Notes (Signed)
 Received sign out from previous provider.  Patient Summary: Stephanie [name] is a 58 y.o. female with PMH COPD, CVA, kidney stone s/p stent placement 11 years ago, HTN, DVT, hep C, DVT, GERD who presents for 1 week of bilateral flank pain, R > L  as well as abdominal pain.  Action List:  Follow up CT AP results, UA results Dispo: if no acute abnormalities likely ok for discharge  Updates ED Course as of 05/23/24 1045  Sun May 23, 2024  0708 58 y/oF 1 wk bilateral flank pain; non obs bilat renal calc. Minimal abd pain/mild CVA tenderness. Awaiting UA  0830 CT AP with no acute abnormalities.    Multiple cortically based calcifications throughout the bilateral kidneys, some of which may also represent small stones. Similar punctate nonobstructive right nephrolithiasis, measuring up to 0.3 cm (2:58). No hydronephrosis. Similar right lower pole renal cyst with peripheral calcification/milk of calcium .  0845 Discussed CT results with pt. Pt still with 7/10 abdominal pain. Additional 4mg  morphine  ordered. Able to provide urine sample  0858 UA positive for UTI. 1g CTX ordered. Will plan to send out on oral antibiotics for complex UTI given nonobstructive nephrolithiasis, flank pain. Vitals reassuring with no fever and no leukocytosis on CBC  1010 Pt reports abdominal pain improved. Tolerated 1g CTX well. Pt lives at home with spouse and states she is able to ambulate, conduct ADLs. Discussed plan for discharge with oral antibiotics  1044 Patient sitting up without difficulty. Order placed for 10-day course of cefpodoxime . Discussed return precautions and pt in agreement

## 2024-05-24 NOTE — ED Provider Notes (Signed)
 EMAP test result follow-up note  May 24, 2024 2:41 PM   Call from patient.  She reports she was seen yesterday in the ED and diagnosed with a UTI as well as intrarenal kidney stones.  She states she was told she would be sent home with pain medication but did not receive any prescriptions.  She also was prescribed cefpodoxime  but at the pharmacy was she was told this was not covered by Medicaid and she would need a different medication.  I reviewed her PDMP and agreed to prescribe a short course of oxycodone .  This is listed as an allergy in her chart but she states this is not true, that she has tolerated in the past.  I canceled the cefpodoxime  and instead prescribed cefuroxime which should be covered by Medicaid.  Urine culture dated 05/23/2024 shows E. coli, sensitivities pending.  Stephanie JONETTA Buys, MD

## 2024-05-26 ENCOUNTER — Encounter (HOSPITAL_COMMUNITY): Payer: Self-pay

## 2024-05-26 ENCOUNTER — Emergency Department (HOSPITAL_COMMUNITY)

## 2024-05-26 ENCOUNTER — Emergency Department (HOSPITAL_COMMUNITY)
Admission: EM | Admit: 2024-05-26 | Discharge: 2024-05-27 | Disposition: A | Discharge status: Home / Self Care | Attending: Emergency Medicine | Admitting: Emergency Medicine

## 2024-05-26 ENCOUNTER — Other Ambulatory Visit: Payer: Self-pay

## 2024-05-26 DIAGNOSIS — N390 Urinary tract infection, site not specified: Secondary | ICD-10-CM | POA: Insufficient documentation

## 2024-05-26 DIAGNOSIS — I10 Essential (primary) hypertension: Secondary | ICD-10-CM | POA: Insufficient documentation

## 2024-05-26 DIAGNOSIS — N200 Calculus of kidney: Secondary | ICD-10-CM | POA: Diagnosis not present

## 2024-05-26 DIAGNOSIS — Z79899 Other long term (current) drug therapy: Secondary | ICD-10-CM | POA: Insufficient documentation

## 2024-05-26 DIAGNOSIS — R1084 Generalized abdominal pain: Secondary | ICD-10-CM | POA: Diagnosis present

## 2024-05-26 DIAGNOSIS — E119 Type 2 diabetes mellitus without complications: Secondary | ICD-10-CM | POA: Insufficient documentation

## 2024-05-26 DIAGNOSIS — Z7902 Long term (current) use of antithrombotics/antiplatelets: Secondary | ICD-10-CM | POA: Diagnosis not present

## 2024-05-26 LAB — CBC WITH DIFFERENTIAL/PLATELET
Abs Immature Granulocytes: 0.02 K/uL (ref 0.00–0.07)
Basophils Absolute: 0 K/uL (ref 0.0–0.1)
Basophils Relative: 1 %
Eosinophils Absolute: 0.2 K/uL (ref 0.0–0.5)
Eosinophils Relative: 3 %
HCT: 48.5 % — ABNORMAL HIGH (ref 36.0–46.0)
Hemoglobin: 14.4 g/dL (ref 12.0–15.0)
Immature Granulocytes: 0 %
Lymphocytes Relative: 29 %
Lymphs Abs: 2.3 K/uL (ref 0.7–4.0)
MCH: 26.6 pg (ref 26.0–34.0)
MCHC: 29.7 g/dL — ABNORMAL LOW (ref 30.0–36.0)
MCV: 89.5 fL (ref 80.0–100.0)
Monocytes Absolute: 0.7 K/uL (ref 0.1–1.0)
Monocytes Relative: 9 %
Neutro Abs: 4.8 K/uL (ref 1.7–7.7)
Neutrophils Relative %: 58 %
Platelets: 203 K/uL (ref 150–400)
RBC: 5.42 MIL/uL — ABNORMAL HIGH (ref 3.87–5.11)
RDW: 15.9 % — ABNORMAL HIGH (ref 11.5–15.5)
WBC: 8 K/uL (ref 4.0–10.5)
nRBC: 0 % (ref 0.0–0.2)

## 2024-05-26 LAB — URINALYSIS, ROUTINE W REFLEX MICROSCOPIC
Bilirubin Urine: NEGATIVE
Glucose, UA: NEGATIVE mg/dL
Ketones, ur: NEGATIVE mg/dL
Nitrite: NEGATIVE
Protein, ur: NEGATIVE mg/dL
Specific Gravity, Urine: 1.011 (ref 1.005–1.030)
WBC, UA: >50 WBC/hpf (ref 0–5)
pH: 7 (ref 5.0–8.0)

## 2024-05-26 LAB — BASIC METABOLIC PANEL WITH GFR
Anion gap: 13 (ref 5–15)
BUN: 6 mg/dL (ref 6–20)
CO2: 26 mmol/L (ref 22–32)
Calcium: 10.2 mg/dL (ref 8.9–10.3)
Chloride: 102 mmol/L (ref 98–111)
Creatinine, Ser: 0.65 mg/dL (ref 0.44–1.00)
GFR, Estimated: >60 mL/min (ref >60)
Glucose, Bld: 135 mg/dL — ABNORMAL HIGH (ref 70–99)
Potassium: 4.5 mmol/L (ref 3.5–5.1)
Sodium: 141 mmol/L (ref 135–145)

## 2024-05-26 MED ORDER — SODIUM CHLORIDE 0.9 % IV BOLUS: 1000.0000 mL | Freq: Once | INTRAVENOUS | Status: DC

## 2024-05-26 MED ORDER — FENTANYL CITRATE PF 50 MCG/ML IJ SOSY
50.0000 ug | PREFILLED_SYRINGE | Freq: Once | INTRAMUSCULAR | Status: AC
Start: 2024-05-26 — End: 2024-05-26
  Administered 2024-05-26: 50 ug via INTRAVENOUS
  Filled 2024-05-26: qty 1

## 2024-05-26 NOTE — ED Provider Triage Note (Signed)
 Emergency Medicine Provider Triage Evaluation Note  Stephanie [name] , a 58 y.o. female  was evaluated in triage.  Pt complains of bilateral flank pain.  Patient was seen over at [facility] on 9/14 for similar symptoms.  There was no evidence of ureterolithiasis but there was a couple of small punctate stones based on CT scan in the kidney.  Also diagnosed with urinary tract infection on antibiotics.  Pain worsened over the last 1 to 2 days prompting her arrival here.  She denies any dysuria or hematuria.  Review of Systems  Positive:  Negative: See above   Physical Exam  BP (!) 194/102   Pulse 83   Temp 98.2 F (36.8 C) (Oral)   Resp 18   Ht 5' 2 (1.575 m)   Wt 86.2 kg   SpO2 93%   BMI 34.75 kg/m  Gen:   Awake, no distress   Resp:  Normal effort  MSK:   Moves extremities without difficulty  Other:  Bilateral CVAT  Medical Decision Making  Medically screening exam initiated at 3:34 PM.  Appropriate orders placed.  Stephanie [name] was informed that the remainder of the evaluation will be completed by another provider, this initial triage assessment does not replace that evaluation, and the importance of remaining in the ED until their evaluation is complete.     Theotis Peers [place], [place] 05/26/24 1535

## 2024-05-26 NOTE — Telephone Encounter (Signed)
 Hi,   Patient contacted the clinic regarding the following:  - Patient called in out of Rx for pain due to kidney stone ER visit. Please contact her  Please contact [place] at [phone].  Thanks, Benny Scollo

## 2024-05-26 NOTE — ED Triage Notes (Signed)
 Sent by PCP for possible kidney stone obstruction. Pt has bilateral kidney stones and a UTI, on abx for UTI. Pain worsened today and pt has been vomiting.

## 2024-05-26 NOTE — Telephone Encounter (Signed)
 Spoke with this patient on the phone. She had to go to the ED this weekend for kidney stone issues. She is having severe 10/10 pain in her lower back, stomach, and under her ribs. She is on antibiotics, but she is out of pain medication. Other symptoms:  - No blood in urine - 100.2 fever - hurts when peeing - no foul smelling urine  Informed her that I would forward this message along to the providers and someone would reach back out.

## 2024-05-27 MED ORDER — LEVOFLOXACIN 500 MG PO TABS: 500.0000 mg | ORAL_TABLET | Freq: Every day | ORAL | 0 refills | Status: AC

## 2024-05-27 MED ORDER — LEVOFLOXACIN 500 MG PO TABS
500.0000 mg | ORAL_TABLET | Freq: Once | ORAL | Status: AC
Start: 2024-05-27 — End: 2024-05-27
  Administered 2024-05-27: 500 mg via ORAL
  Filled 2024-05-27: qty 1

## 2024-05-27 MED ORDER — OXYCODONE HCL 5 MG PO TABS
5.0000 mg | ORAL_TABLET | Freq: Once | ORAL | Status: AC
  Administered 2024-05-27: 5 mg via ORAL
  Filled 2024-05-27: qty 1

## 2024-05-27 MED ORDER — OXYCODONE HCL 5 MG PO TABS: 5.0000 mg | ORAL_TABLET | ORAL | 0 refills | Status: AC | PRN

## 2024-05-27 NOTE — Discharge Instructions (Addendum)
 Your urine shows signs of infection, and the culture from urgent care shows that you need to be on a different antibiotic.  Stop taking the antibiotic which was prescribed to you at the urgent care and start taking levofloxacin .  You may take ibuprofen  as needed for pain, take oxycodone  for pain not relieved by ibuprofen .  Return to the emergency department if you start vomiting or start running a high fever or if pain is getting worse.  Otherwise, follow-up with your primary care provider after you have completed the course of antibiotics to make sure that the infection has been cleared.

## 2024-05-27 NOTE — ED Notes (Signed)
 Called pharmacy to send ABX, medication in a failed unit in pyxis.

## 2024-05-27 NOTE — ED Provider Notes (Signed)
 [place] EMERGENCY DEPARTMENT AT [facility] Provider Note   CSN: [id number] Arrival date & time: 05/26/24  1516     Patient presents with: Flank Pain   Stephanie [name] is a 58 y.o. female.   The history is provided by the patient.  Flank Pain   She has history of hypertension, diabetes, stroke, kidney stones and comes in complaining of generalized abdominal and back pain for about the last 5 days.  She had been seen at urgent care and was diagnosed with a UTI and given prescriptions for cefpodoxime  and cefuroxime.  She has been taking them but is not noticing any improvement.  She had been given prescription for oxycodone  which was giving her relief of pain, but she has run out.  She denies nausea or vomiting and denies fever or chills.  She denies urinary urgency or frequency but does endorse urinary tenesmus.    Prior to Admission medications   Medication Sig Start Date End Date Taking? Authorizing Provider  albuterol  (VENTOLIN  HFA) 108 (90 Base) MCG/ACT inhaler INHALE 2 PUFFS BY MOUTH EVERY 6 HOURS AS NEEDED FOR WHEEZING OR SHORTNESS OF BREATH 06/02/23   Bernardo Fend, DO  ALPRAZolam  (XANAX ) 1 MG tablet Take 1 mg by mouth 2 (two) times daily. 11/24/23   [provider]  amLODipine  (NORVASC ) 2.5 MG tablet Take 1 tablet (2.5 mg total) by mouth daily. 04/16/24   Bernardo Fend, DO  ARIPiprazole  (ABILIFY ) 15 MG tablet Take 15 mg by mouth every morning. 06/10/21   [provider]  clopidogrel  (PLAVIX ) 75 MG tablet Take 75 mg by mouth daily. 02/25/22   [provider]  Continuous Glucose Receiver (DEXCOM G7 RECEIVER) DEVI Dx:E11.65 03/10/24   Bernardo Fend, DO  Continuous Glucose Receiver (FREESTYLE LIBRE 2 READER) DEVI Patient lost her's Patient not taking: Reported on 02/19/2024 05/21/23   Bernardo Fend, DO  Continuous Glucose Sensor (DEXCOM G7 SENSOR) MISC Dx E11.65 03/10/24   Bernardo Fend, DO  Continuous Glucose Sensor  (FREESTYLE LIBRE 2 SENSOR) MISC USE 1 SENSOR EVERY 14 DAYS 02/24/24   Bernardo Fend, DO  diazepam  (VALIUM ) 10 MG tablet Take 10 mg by mouth 3 (three) times daily. 03/27/22   [provider]  Fluticasone -Umeclidin-Vilant (TRELEGY ELLIPTA ) 100-62.5-25 MCG/ACT AEPB Inhale 1 puff into the lungs daily. 02/16/24   Bernardo Fend, DO  lamoTRIgine  (LAMICTAL ) 100 MG tablet Take 100 mg by mouth 2 (two) times daily.    [provider]  lisinopril  (ZESTRIL ) 10 MG tablet Take 1 tablet by mouth once daily 04/07/24   Bernardo Fend, DO  metFORMIN  (GLUCOPHAGE -XR) 500 MG 24 hr tablet Take 1 tablet (500 mg total) by mouth 2 (two) times daily with a meal. 04/16/24   Bernardo Fend, DO  omeprazole  (PRILOSEC) 40 MG capsule Take 1 capsule (40 mg total) by mouth daily. 02/04/24   Bernardo Fend, DO  potassium chloride  SA (KLOR-CON  M20) 20 MEQ tablet TAKE ONE TABLET BY MOUTH TWICE DAILY FOR THREE DAYS. THEN TAKE ONE TABLET BY MOUTH ONCE DAILY WITH LASIX  02/17/24   Bernardo Fend, DO  prazosin  (MINIPRESS ) 5 MG capsule Take 5 mg by mouth at bedtime. 11/24/23   [provider]  promethazine  (PHENERGAN ) 12.5 MG tablet Take 1 tablet (12.5 mg total) by mouth every 8 (eight) hours as needed for nausea or vomiting. 12/01/23   Bernardo Fend, DO  Semaglutide , 2 MG/DOSE, 8 MG/3ML SOPN Inject 2 mg as directed once a week. 04/16/24   Bernardo Fend, DO  traZODone  (DESYREL ) 150  MG tablet Take by mouth at bedtime.    [provider]  Vilazodone HCl (VIIBRYD) 40 MG TABS Take 40 mg by mouth daily.    [provider]    Allergies: Gabapentin , Ibuprofen , Sulfa  antibiotics, Tylenol  [acetaminophen ], Aspirin, Buprenorphine hcl, Carbamazepine, Compazine , Elemental sulfur , Metrizamide, Naproxen, Penicillin g, Reglan  [metoclopramide ], Toradol  [ketorolac  tromethamine ], Tramadol , and Zofran  [ondansetron  hcl]    Review of Systems  Genitourinary:  Positive for flank pain.  All other  systems reviewed and are negative.   Updated Vital Signs BP (!) 141/73 (BP Location: Right Arm)   Pulse 85   Temp 98.2 F (36.8 C) (Oral)   Resp 18   Ht 5' 2 (1.575 m)   Wt 86.2 kg   SpO2 98%   BMI 34.75 kg/m   Physical Exam Vitals and nursing note reviewed.   58 year old female, resting comfortably and in no acute distress. Vital signs are significant for borderline elevated blood pressure. Oxygen  saturation is 98%, which is normal. Head is normocephalic and atraumatic. PERRLA, EOMI.  Back is nontender and there is no CVA tenderness. Lungs are clear without rales, wheezes, or rhonchi. Chest is nontender. Heart has regular rate and rhythm without murmur. Abdomen is soft, flat, with mild tenderness diffusely.  There is no rebound or guarding. Extremities have no cyanosis or edema, full range of motion is present. Skin is warm and dry without rash. Neurologic: Mental status is normal, moves all extremities equally.  (all labs ordered are listed, but only abnormal results are displayed) Labs Reviewed  CBC WITH DIFFERENTIAL/PLATELET - Abnormal; Notable for the following components:      Result Value   RBC 5.42 (*)    HCT 48.5 (*)    MCHC 29.7 (*)    RDW 15.9 (*)    All other components within normal limits  BASIC METABOLIC PANEL WITH GFR - Abnormal; Notable for the following components:   Glucose, Bld 135 (*)    All other components within normal limits  URINALYSIS, ROUTINE W REFLEX MICROSCOPIC - Abnormal; Notable for the following components:   APPearance CLOUDY (*)    Hgb urine dipstick SMALL (*)    Leukocytes,Ua LARGE (*)    Bacteria, UA MANY (*)    All other components within normal limits  URINE CULTURE    Radiology: CT Renal Stone Study Result Date: 05/26/2024 CLINICAL DATA:  Bilateral flank pain. EXAM: CT ABDOMEN AND PELVIS WITHOUT CONTRAST TECHNIQUE: Multidetector CT imaging of the abdomen and pelvis was performed following the standard protocol without IV  contrast. RADIATION DOSE REDUCTION: This exam was performed according to the departmental dose-optimization program which includes automated exposure control, adjustment of the mA and/or kV according to patient size and/or use of iterative reconstruction technique. COMPARISON:  May 30, 2023. FINDINGS: Lower chest: No acute abnormality. Hepatobiliary: Probable hepatic steatosis. No cholelithiasis or biliary dilatation. Pancreas: Unremarkable. No pancreatic ductal dilatation or surrounding inflammatory changes. Spleen: Normal in size without focal abnormality. Adrenals/Urinary Tract: Adrenal glands appear normal. Bilateral nephrolithiasis is again noted. Severe cortical scarring of right kidney is noted. No hydronephrosis or renal obstruction is noted. No ureteral calculi are noted. Urinary bladder is decompressed. Stomach/Bowel: Stomach is within normal limits. Appendix appears normal. No evidence of bowel wall thickening, distention, or inflammatory changes. Vascular/Lymphatic: Aortic atherosclerosis. No enlarged abdominal or pelvic lymph nodes. Reproductive: Status post hysterectomy. No adnexal masses. Other: No abdominal wall hernia or abnormality. No abdominopelvic ascites. Musculoskeletal: No acute or significant osseous findings. IMPRESSION: 1. Bilateral nonobstructive  nephrolithiasis. Severe cortical scarring of right kidney is noted. 2. Probable hepatic steatosis. 3. Aortic atherosclerosis. Aortic Atherosclerosis (ICD10-I70.0). Electronically Signed   By: Lynwood Landy Raddle M.D.   On: 05/26/2024 17:51     Procedures   Medications Ordered in the ED  sodium chloride  0.9 % bolus 1,000 mL (has no administration in time range)  oxyCODONE  (Oxy IR/ROXICODONE ) immediate release tablet 5 mg (has no administration in time range)  levofloxacin  (LEVAQUIN ) tablet 500 mg (has no administration in time range)  fentaNYL  (SUBLIMAZE ) injection 50 mcg (50 mcg Intravenous Given 05/26/24 1612)                                     Medical Decision Making  Generalized abdominal and back pain in the setting of urinary infection and history of kidney stones.  This a presentation with a wide range of treatment options and carries with it a high risk of morbidity and complications.  Differential diagnosis includes, but is not limited to, renal colic, pyelonephritis, diverticulitis.  I have reviewed her laboratory tests, and my interpretation is normal basic metabolic panel, normal CBC including normal WBC and differential, urinalysis consistent with UTI with many bacteria and greater than 50 WBCs and WBC clumps present.  Urine has been sent for culture.  CT of abdomen and pelvis without contrast showed presence of nephrolithiasis but no urolithiasis or hydronephrosis and no acute process.  I have independently viewed the images, and agree with radiologist's interpretation.  I reviewed her old records and do note urgent care visit on 05/23/2024 with diagnosis of UTI and prescriptions for oxycodone , cefuroxime, cefpodoxime .  Urine culture at that time grew E. coli which was resistant to all penicillins and cephalosporins but sensitive to fluoroquinolones.  I have ordered a dose of levofloxacin  and oxycodone  and I will discharge her with prescriptions for both.  She will need to follow-up with her primary care provider upon completion of the course of antibiotics.     Final diagnoses:  Urinary tract infection with hematuria, site unspecified    ED Discharge Orders          Ordered    levofloxacin  (LEVAQUIN ) 500 MG tablet  Daily        05/27/24 0049    oxyCODONE  (ROXICODONE ) 5 MG immediate release tablet  Every 4 hours PRN        05/27/24 0049               Raford Lenis, MD 05/27/24 [phone]

## 2024-05-28 LAB — URINE CULTURE: Culture: >=100000 — AB

## 2024-06-01 DIAGNOSIS — Z6834 Body mass index (BMI) 34.0-34.9, adult: Secondary | ICD-10-CM | POA: Diagnosis not present

## 2024-06-01 DIAGNOSIS — S93602A Unspecified sprain of left foot, initial encounter: Secondary | ICD-10-CM | POA: Diagnosis not present

## 2024-06-01 DIAGNOSIS — R2242 Localized swelling, mass and lump, left lower limb: Secondary | ICD-10-CM | POA: Diagnosis not present

## 2024-06-02 DIAGNOSIS — Z886 Allergy status to analgesic agent status: Secondary | ICD-10-CM | POA: Diagnosis not present

## 2024-06-02 DIAGNOSIS — I10 Essential (primary) hypertension: Secondary | ICD-10-CM | POA: Diagnosis not present

## 2024-06-02 DIAGNOSIS — Z86718 Personal history of other venous thrombosis and embolism: Secondary | ICD-10-CM | POA: Diagnosis not present

## 2024-06-02 DIAGNOSIS — Z833 Family history of diabetes mellitus: Secondary | ICD-10-CM | POA: Diagnosis not present

## 2024-06-02 DIAGNOSIS — E119 Type 2 diabetes mellitus without complications: Secondary | ICD-10-CM | POA: Diagnosis not present

## 2024-06-02 DIAGNOSIS — J449 Chronic obstructive pulmonary disease, unspecified: Secondary | ICD-10-CM | POA: Diagnosis not present

## 2024-06-02 DIAGNOSIS — Z880 Allergy status to penicillin: Secondary | ICD-10-CM | POA: Diagnosis not present

## 2024-06-02 DIAGNOSIS — Z043 Encounter for examination and observation following other accident: Secondary | ICD-10-CM | POA: Diagnosis not present

## 2024-06-02 DIAGNOSIS — N3000 Acute cystitis without hematuria: Secondary | ICD-10-CM | POA: Diagnosis not present

## 2024-06-02 DIAGNOSIS — Z882 Allergy status to sulfonamides status: Secondary | ICD-10-CM | POA: Diagnosis not present

## 2024-06-02 DIAGNOSIS — Z7984 Long term (current) use of oral hypoglycemic drugs: Secondary | ICD-10-CM | POA: Diagnosis not present

## 2024-06-02 DIAGNOSIS — Z8673 Personal history of transient ischemic attack (TIA), and cerebral infarction without residual deficits: Secondary | ICD-10-CM | POA: Diagnosis not present

## 2024-06-02 DIAGNOSIS — Z7902 Long term (current) use of antithrombotics/antiplatelets: Secondary | ICD-10-CM | POA: Diagnosis not present

## 2024-06-02 DIAGNOSIS — Z825 Family history of asthma and other chronic lower respiratory diseases: Secondary | ICD-10-CM | POA: Diagnosis not present

## 2024-06-02 DIAGNOSIS — Z8249 Family history of ischemic heart disease and other diseases of the circulatory system: Secondary | ICD-10-CM | POA: Diagnosis not present

## 2024-06-05 ENCOUNTER — Other Ambulatory Visit: Payer: Self-pay | Admitting: Internal Medicine

## 2024-06-05 DIAGNOSIS — E1165 Type 2 diabetes mellitus with hyperglycemia: Secondary | ICD-10-CM

## 2024-06-06 ENCOUNTER — Other Ambulatory Visit: Payer: Self-pay | Admitting: Internal Medicine

## 2024-06-06 DIAGNOSIS — E1165 Type 2 diabetes mellitus with hyperglycemia: Secondary | ICD-10-CM

## 2024-06-07 ENCOUNTER — Other Ambulatory Visit: Payer: Self-pay | Admitting: Internal Medicine

## 2024-06-07 ENCOUNTER — Ambulatory Visit: Payer: Self-pay

## 2024-06-07 DIAGNOSIS — E1165 Type 2 diabetes mellitus with hyperglycemia: Secondary | ICD-10-CM

## 2024-06-07 NOTE — Telephone Encounter (Signed)
 FYI Only or Action Required?: FYI only for provider. Pt is requesting refills be called in today. She has been out of 2 medications for 1 month.  Patient was last seen in primary care on 04/16/2024 by Bernardo Fend, DO.  Called Nurse Triage reporting Hypertension. Pt is also reporting black spots in vision and dizziness.  Symptoms began today.  Interventions attempted: Prescription medications: HTN meds as prescribed.  Symptoms are: gradually worsening.  Triage Disposition: Go to ED Now (Notify PCP)  Patient/caregiver understands and will follow disposition?: Yes                         Copied from CRM [id number]. Topic: Clinical - Red Word Triage >> Jun 07, 2024  4:39 PM Everette C wrote: [facility] that prompted transfer to Nurse Triage: The patient's called to share that their BP was 180/110 when checked roughly 45 minutes ago around 4:00 PM the patient is currently experiencing dizziness Reason for Disposition  [1] Systolic BP >= 160 OR Diastolic >= 100 AND [2] cardiac (e.g., breathing difficulty, chest pain) or neurologic symptoms (e.g., new-onset blurred or double vision, unsteady gait)  Answer Assessment - Initial Assessment Questions 1. BLOOD PRESSURE: What is your blood pressure? Did you take at least two measurements 5 minutes apart?     1 times 2. ONSET: When did you take your blood pressure?     45 minutes 180/110 3. HOW: How did you take your blood pressure? (e.g., automatic home BP monitor, visiting nurse)     Home monitor 4. HISTORY: Do you have a history of high blood pressure?     yes 5. MEDICINES: Are you taking any medicines for blood pressure? Have you missed any doses recently?     Has 4 and has taken all of them 6. OTHER SYMPTOMS: Do you have any symptoms? (e.g., blurred vision, chest pain, difficulty breathing, headache, weakness)     dizziness  Protocols used: Blood Pressure - High-A-AH

## 2024-06-07 NOTE — Telephone Encounter (Unsigned)
 Copied from CRM #[id number]. Topic: Clinical - Medication Refill >> Jun 07, 2024  9:25 AM Stephanie [name] wrote: Medication:  Dexcom G7 Receiver Continuous Glucose Receiver (DEXCOM G7 RECEIVER) DEVI Continuous Glucose Sensor (FREESTYLE LIBRE 2 SENSOR) MISC      Has the patient contacted their pharmacy? Yes (Agent: If no, request that the patient contact the pharmacy for the refill. If patient does not wish to contact the pharmacy document the reason why and proceed with request.) (Agent: If yes, when and what did the pharmacy advise?)  This is the patient's preferred pharmacy:  [facility] [address] [place] - [address] [place] [address] Phone: [phone] Fax: [phone]   Is this the correct pharmacy for this prescription? Yes If no, delete pharmacy and type the correct one.   Has the prescription been filled recently? Yes  Is the patient out of the medication? Yes  Has the patient been seen for an appointment in the last year OR does the patient have an upcoming appointment? Yes  Can we respond through MyChart? Yes  Agent: Please be advised that Rx refills may take up to 3 business days. We ask that you follow-up with your pharmacy.

## 2024-06-07 NOTE — Telephone Encounter (Signed)
 Requested medications are due for refill today.  unusre  Requested medications are on the active medications list.  yes  Last refill. Unsure  Future visit scheduled.   yes  Notes to clinic.  Pt is requesting for refills on diabetic supplies. However the  2 refills are fro different systems. Please advise.    Requested Prescriptions  Pending Prescriptions Disp Refills   Continuous Glucose Receiver (DEXCOM G7 RECEIVER) DEVI 1 each 0    Sig: Dx:E11.65     Endocrinology: Diabetes - Testing Supplies Passed - 06/07/2024  4:58 PM      Passed - Valid encounter within last 12 months    Recent Outpatient Visits           1 month ago Type 2 diabetes mellitus with hyperglycemia, without long-term current use of insulin  [facility])   [facility] Bernardo Fend, DO   4 months ago COPD exacerbation [facility])   [place] [facility] Bernardo Fend, DO   4 months ago Type 2 diabetes mellitus with hyperglycemia, without long-term current use of insulin  [facility])   [place] [facility] Bernardo Fend, DO   6 months ago Type 2 diabetes mellitus with hyperglycemia, without long-term current use of insulin  [facility])   [place] [facility] Bernardo Fend, DO       Future Appointments             In 1 week Bernardo Fend, DO [facility] [place]             Continuous Glucose Sensor (FREESTYLE LIBRE 2 SENSOR) MISC 4 each 2     Endocrinology: Diabetes - Testing Supplies Passed - 06/07/2024  4:58 PM      Passed - Valid encounter within last 12 months    Recent Outpatient Visits           1 month ago Type 2 diabetes mellitus with hyperglycemia, without long-term current use of insulin  [facility])   [facility] Bernardo Fend, DO   4 months ago COPD exacerbation [facility])   [facility] Bernardo Fend, DO   4 months ago Type 2  diabetes mellitus with hyperglycemia, without long-term current use of insulin  [facility])   [facility] Bernardo Fend, DO   6 months ago Type 2 diabetes mellitus with hyperglycemia, without long-term current use of insulin  [facility])   [facility] Bernardo Fend, DO       Future Appointments             In 1 week Bernardo Fend, DO [facility] [place]

## 2024-06-08 ENCOUNTER — Other Ambulatory Visit: Payer: Self-pay | Admitting: Emergency Medicine

## 2024-06-08 MED ORDER — DEXCOM G7 RECEIVER DEVI: 0 refills | Status: DC

## 2024-06-08 MED ORDER — FREESTYLE LIBRE 2 SENSOR MISC: 1.0000 | 2 refills | Status: AC

## 2024-06-08 NOTE — Telephone Encounter (Signed)
 Requested Prescriptions  Refused Prescriptions Disp Refills   Continuous Glucose Receiver (DEXCOM G7 RECEIVER) DEVI [Pharmacy Med Name: DEXCOM G7 RECEIVER  MIS] 1 each 0    Sig: USE AS DIRECTED     Endocrinology: Diabetes - Testing Supplies Passed - 06/08/2024 11:38 AM      Passed - Valid encounter within last 12 months    Recent Outpatient Visits           1 month ago Type 2 diabetes mellitus with hyperglycemia, without long-term current use of insulin  [facility])   [place] [facility] Bernardo Fend, DO   4 months ago COPD exacerbation [facility])   [place] [facility] Bernardo Fend, DO   4 months ago Type 2 diabetes mellitus with hyperglycemia, without long-term current use of insulin  [facility])   [facility] Bernardo Fend, DO   6 months ago Type 2 diabetes mellitus with hyperglycemia, without long-term current use of insulin  [facility])   [facility] Bernardo Fend, DO       Future Appointments             In 1 week Bernardo Fend, DO [facility] [place]

## 2024-06-08 NOTE — Telephone Encounter (Signed)
 Amlodipine  was sent #90 with one refill on 8/18.

## 2024-06-08 NOTE — Telephone Encounter (Signed)
 Requested Prescriptions  Pending Prescriptions Disp Refills   Semaglutide , 2 MG/DOSE, (OZEMPIC , 2 MG/DOSE,) 8 MG/3ML SOPN [Pharmacy Med Name: Ozempic  (2 MG/DOSE) 8 MG/3ML Subcutaneous Solution Pen-injector] 3 mL 2    Sig: INJECT 2MG  UNDER THE SKIN AS DIRECTED ONCE A WEEK     Endocrinology:  Diabetes - GLP-1 Receptor Agonists - semaglutide  Failed - 06/08/2024  3:25 PM      Failed - HBA1C in normal range and within 180 days    Hemoglobin A1C  Date Value Ref Range Status  01/20/2024 8.3 (A) 4.0 - 5.6 % Final   Hgb A1c MFr Bld  Date Value Ref Range Status  04/15/2023 7.2 (H) <5.7 % of total Hgb Final    Comment:    For someone without known diabetes, a hemoglobin A1c value of 6.5% or greater indicates that they may have  diabetes and this should be confirmed with a follow-up  test. . For someone with known diabetes, a value <7% indicates  that their diabetes is well controlled and a value  greater than or equal to 7% indicates suboptimal  control. A1c targets should be individualized based on  duration of diabetes, age, comorbid conditions, and  other considerations. . Currently, no consensus exists regarding use of hemoglobin A1c for diagnosis of diabetes for children. .          Passed - Cr in normal range and within 360 days    Creat  Date Value Ref Range Status  05/19/2023 0.73 0.50 - 1.03 mg/dL Final   Creatinine, Ser  Date Value Ref Range Status  05/26/2024 0.65 0.44 - 1.00 mg/dL Final   Creatinine, Urine  Date Value Ref Range Status  06/30/2023 118 20 - 275 mg/dL Final         Passed - Valid encounter within last 6 months    Recent Outpatient Visits           1 month ago Type 2 diabetes mellitus with hyperglycemia, without long-term current use of insulin  [facility])   [place] [facility] Bernardo Fend, DO   4 months ago COPD exacerbation [facility])   [place] [facility] Bernardo Fend, DO   4 months ago Type 2 diabetes  mellitus with hyperglycemia, without long-term current use of insulin  [facility])   [place] [facility] Bernardo Fend, DO   6 months ago Type 2 diabetes mellitus with hyperglycemia, without long-term current use of insulin  [facility])   [facility] Bernardo Fend, DO       Future Appointments             In 1 week Bernardo Fend, DO [facility] [place]

## 2024-06-09 ENCOUNTER — Ambulatory Visit: Payer: Self-pay

## 2024-06-09 NOTE — Telephone Encounter (Signed)
 FYI Only or Action Required?: FYI only for provider.  Patient was last seen in primary care on 04/16/2024 by Bernardo Fend, DO.  Called Nurse Triage reporting No chief complaint on file.. Symptoms began several days ago.  Interventions attempted: Nothing.  Symptoms are: gradually worsening.  Triage Disposition: No disposition on file.  Patient/caregiver understands and will follow disposition?:   Copied from CRM [id number]. Topic: Clinical - Red Word Triage >> Jun 09, 2024  1:16 PM Antwanette L wrote: Red Word that prompted transfer to Nurse Triage: Patient bp is 174/110 but is reporting no symptoms Reason for Disposition  [1] Systolic BP >= 130 OR Diastolic >= 80 AND [2] taking BP medications  Answer Assessment - Initial Assessment Questions 1. BLOOD PRESSURE: What is your blood pressure? Did you take at least two measurements 5 minutes apart?    140/105, 174/110  2. ONSET: When did you take your blood pressure?     Several Days ago  3. HOW: How did you take your blood pressure? (e.g., automatic home BP monitor, visiting nurse)     Automatic Blood Pressure  4. HISTORY: Do you have a history of high blood pressure?     Yes  5. MEDICINES: Are you taking any medicines for blood pressure? Have you missed any doses recently?     Yes, No Missed Doses  6. OTHER SYMPTOMS: Do you have any symptoms? (e.g., blurred vision, chest pain, difficulty breathing, headache, weakness)     Denies symptoms  7. PREGNANCY: Is there any chance you are pregnant? When was your last menstrual period?     No and No  Protocols used: Blood Pressure - High-A-AH

## 2024-06-14 DIAGNOSIS — I10 Essential (primary) hypertension: Secondary | ICD-10-CM | POA: Diagnosis not present

## 2024-06-14 DIAGNOSIS — J449 Chronic obstructive pulmonary disease, unspecified: Secondary | ICD-10-CM | POA: Diagnosis not present

## 2024-06-14 DIAGNOSIS — N200 Calculus of kidney: Secondary | ICD-10-CM | POA: Diagnosis not present

## 2024-06-14 DIAGNOSIS — N3090 Cystitis, unspecified without hematuria: Secondary | ICD-10-CM | POA: Diagnosis not present

## 2024-06-14 DIAGNOSIS — N760 Acute vaginitis: Secondary | ICD-10-CM | POA: Diagnosis not present

## 2024-06-14 DIAGNOSIS — R10813 Right lower quadrant abdominal tenderness: Secondary | ICD-10-CM | POA: Diagnosis not present

## 2024-06-14 DIAGNOSIS — N3000 Acute cystitis without hematuria: Secondary | ICD-10-CM | POA: Diagnosis not present

## 2024-06-14 DIAGNOSIS — N10 Acute tubulo-interstitial nephritis: Secondary | ICD-10-CM | POA: Diagnosis not present

## 2024-06-15 ENCOUNTER — Ambulatory Visit: Payer: Self-pay

## 2024-06-15 DIAGNOSIS — R10813 Right lower quadrant abdominal tenderness: Secondary | ICD-10-CM | POA: Diagnosis not present

## 2024-06-15 DIAGNOSIS — Z86718 Personal history of other venous thrombosis and embolism: Secondary | ICD-10-CM | POA: Diagnosis not present

## 2024-06-15 DIAGNOSIS — Z79899 Other long term (current) drug therapy: Secondary | ICD-10-CM | POA: Diagnosis not present

## 2024-06-15 DIAGNOSIS — Z885 Allergy status to narcotic agent status: Secondary | ICD-10-CM | POA: Diagnosis not present

## 2024-06-15 DIAGNOSIS — N3090 Cystitis, unspecified without hematuria: Secondary | ICD-10-CM | POA: Diagnosis not present

## 2024-06-15 DIAGNOSIS — Z886 Allergy status to analgesic agent status: Secondary | ICD-10-CM | POA: Diagnosis not present

## 2024-06-15 DIAGNOSIS — N10 Acute tubulo-interstitial nephritis: Secondary | ICD-10-CM | POA: Diagnosis not present

## 2024-06-15 DIAGNOSIS — F419 Anxiety disorder, unspecified: Secondary | ICD-10-CM | POA: Diagnosis not present

## 2024-06-15 DIAGNOSIS — E119 Type 2 diabetes mellitus without complications: Secondary | ICD-10-CM | POA: Diagnosis not present

## 2024-06-15 DIAGNOSIS — K2100 Gastro-esophageal reflux disease with esophagitis, without bleeding: Secondary | ICD-10-CM | POA: Diagnosis not present

## 2024-06-15 DIAGNOSIS — Z7902 Long term (current) use of antithrombotics/antiplatelets: Secondary | ICD-10-CM | POA: Diagnosis not present

## 2024-06-15 DIAGNOSIS — I10 Essential (primary) hypertension: Secondary | ICD-10-CM | POA: Diagnosis not present

## 2024-06-15 DIAGNOSIS — R9431 Abnormal electrocardiogram [ECG] [EKG]: Secondary | ICD-10-CM | POA: Diagnosis not present

## 2024-06-15 DIAGNOSIS — J449 Chronic obstructive pulmonary disease, unspecified: Secondary | ICD-10-CM | POA: Diagnosis not present

## 2024-06-15 DIAGNOSIS — Z880 Allergy status to penicillin: Secondary | ICD-10-CM | POA: Diagnosis not present

## 2024-06-15 DIAGNOSIS — N3000 Acute cystitis without hematuria: Secondary | ICD-10-CM | POA: Diagnosis not present

## 2024-06-15 NOTE — Telephone Encounter (Signed)
 FYI Only or Action Required?: FYI only for provider.  Patient was last seen in primary care on 04/16/2024 by Bernardo Fend, DO.  Called Nurse Triage reporting Hypertension.  Symptoms began unknown.  Interventions attempted: Other: unknown.  Symptoms are: unchanged.  Triage Disposition: Go to ED Now (Notify PCP)  Patient/caregiver understands and will follow disposition?: Yes   Copied from CRM [id number]. Topic: Clinical - Red Word Triage >> Jun 15, 2024  8:03 AM Darshell M wrote: Red Word that prompted transfer to Nurse Triage: Patient with increased blood pressure 200/140. Shortness of breath. Headache. Reason for Disposition  [1] Systolic BP >= 160 OR Diastolic >= 100 AND [2] cardiac (e.g., breathing difficulty, chest pain) or neurologic symptoms (e.g., new-onset blurred or double vision, unsteady gait)  Answer Assessment - Initial Assessment Questions Denies numbness, weakness. States she has chest pressure, shortness of breath, and bad headache. Patient refuses 911 and states husband is on his way home and will take her to ER right away. Patient states she went to ED yesterday and was told she has UTI and yeast infection, but not given any medications for that or her BP at that time, stating her BP was this high yesterday in the ER.  Patient educated very thoroughly on need for 911 and patient refused, stating her husband will take her to ED.  1. BLOOD PRESSURE: What is your blood pressure? Did you take at least two measurements 5 minutes apart?     210/148  2. ONSET: When did you take your blood pressure?     Unable to assess 3. HOW: How did you take your blood pressure? (e.g., automatic home BP monitor, visiting nurse)     At home 4. HISTORY: Do you have a history of high blood pressure?     Unable to assess 5. MEDICINES: Are you taking any medicines for blood pressure? Have you missed any doses recently?     Unable to assess 6. OTHER SYMPTOMS: Do you have  any symptoms? (e.g., blurred vision, chest pain, difficulty breathing, headache, weakness)     Chest pressure, shortness of breath, headache  Protocols used: Blood Pressure - High-A-AH

## 2024-06-15 NOTE — Telephone Encounter (Signed)
 FYI

## 2024-06-16 ENCOUNTER — Ambulatory Visit: Admitting: Internal Medicine

## 2024-06-17 ENCOUNTER — Ambulatory Visit: Admitting: Internal Medicine

## 2024-06-18 ENCOUNTER — Ambulatory Visit (INDEPENDENT_AMBULATORY_CARE_PROVIDER_SITE_OTHER): Admitting: Internal Medicine

## 2024-06-18 ENCOUNTER — Other Ambulatory Visit: Payer: Self-pay

## 2024-06-18 ENCOUNTER — Encounter: Payer: Self-pay | Admitting: Internal Medicine

## 2024-06-18 VITALS — BP 142/84 | HR 99 | Temp 98.3°F | Resp 16 | Ht 62.0 in | Wt 194.8 lb

## 2024-06-18 DIAGNOSIS — E1165 Type 2 diabetes mellitus with hyperglycemia: Secondary | ICD-10-CM

## 2024-06-18 DIAGNOSIS — M545 Low back pain, unspecified: Secondary | ICD-10-CM | POA: Diagnosis not present

## 2024-06-18 DIAGNOSIS — Z7985 Long-term (current) use of injectable non-insulin antidiabetic drugs: Secondary | ICD-10-CM

## 2024-06-18 DIAGNOSIS — G40909 Epilepsy, unspecified, not intractable, without status epilepticus: Secondary | ICD-10-CM | POA: Diagnosis not present

## 2024-06-18 DIAGNOSIS — Z1231 Encounter for screening mammogram for malignant neoplasm of breast: Secondary | ICD-10-CM

## 2024-06-18 LAB — POCT GLYCOSYLATED HEMOGLOBIN (HGB A1C): Hemoglobin A1C: 7.1 % — AB (ref 4.0–5.6)

## 2024-06-18 MED ORDER — NAPROXEN 500 MG PO TABS: 500.0000 mg | ORAL_TABLET | Freq: Two times a day (BID) | ORAL | 0 refills | Status: AC

## 2024-06-18 MED ORDER — LAMOTRIGINE 100 MG PO TABS: 100.0000 mg | ORAL_TABLET | Freq: Two times a day (BID) | ORAL | 1 refills | Status: DC

## 2024-06-18 NOTE — Patient Instructions (Addendum)
 It was great seeing you today!  Plan discussed at today's visit: -Continue antibiotic -Take Naproxen 500 mg every 12 hours for pain, take with food and Benadryl  if causing itching -Increase Amlodipine  to 5 mg for 1 week, recheck blood pressure and if normal can go back to 2.5 mg   Follow up in: 3 months   Take care and let us  know if you have any questions or concerns prior to your next visit.  Dr. Bernardo

## 2024-06-18 NOTE — Progress Notes (Signed)
 Established Patient Office Visit  Subjective   [name]: Stephanie [name], female    DOB: 10/08/1965  Age: 58 y.o. MRN: [id number]  Chief Complaint  Patient presents with   Follow-up    ER     Patient is here for follow up on chronic medical condition and recent ER visit.   Discussed the use of AI scribe software for clinical note transcription with the patient, who gave verbal consent to proceed.  History of Present Illness  Stephanie [name] is a 58 year old female who presents with flank and back pain. She is here with her friend today.  She experiences persistent flank and back pain despite a recent ER visit where she was initially diagnosed with a severe urinary tract infection. However, urine cultures did not confirm an infection. CT showing perinephritic stranding but no hydronephrosis. She was started on Cipro  500 mg but continues to have pain. During the ER visit, she received IV fluids and oxycodone , but no pain medication was prescribed for home use. Anti-inflammatory medications have been effective in the past, but she has a mild allergy to naproxen, causing itching.  Her blood pressure was elevated at 208/169 during the ER visit was better at 142/84 today. She takes amlodipine  2.5 mg and lisinopril  10 mg for hypertension. She monitors her blood pressure at home.  She is on Ozempic  for diabetes management and has one refill left. She takes Lamictal  100 mg twice daily for seizures and needs a refill until her next neurologist appointment in November.  Hypertension: -Medications: Lisinopril  10 mg, Amlodipine  2.5 mg. No longer taking Lasix , swelling resolved.  -Patient is compliant with above medications and reports no side effects. -Denies any SOB, CP, vision changes, LE edema or symptoms of hypotension -Diet: eating less calories  -Exercise: recently working out at [employer], lost 11 pounds  Diabetes, Type 2: -Last A1c 8.3% 5/25 -Medications: Ozempic  increased to  2 mg at LOV, Metformin  500 mg BID -Patient is compliant with the above medications and reports no side effects. -Checking BG at home: 165-300, higher lately  -Eye exam: UTD -Foot exam: UTD -Microalbumin: UTD -Statin: uncertain if taking -PNA vaccine: UTD -Denies symptoms of hypoglycemia, polyuria, polydipsia, numbness extremities, foot ulcers/trauma.  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   Bipolar disorder (HCC) 05/09/2016   Hyperlipidemia, unspecified 05/09/2016   Irritable bowel syndrome 05/09/2016   Lumbago with sciatica 05/09/2016   Esophageal reflux 05/09/2016   Scoliosis 05/09/2016   Diverticulosis of colon 05/09/2016   DDD (degenerative disc disease), cervical 05/09/2016   ICH (intracerebral hemorrhage) (HCC)    Primary hypertension 04/19/2016   Cytotoxic brain edema (HCC) 04/19/2016   IVH (intraventricular hemorrhage) (HCC) 04/18/2016   Bilateral carpal tunnel syndrome 04/12/2016   Falls frequently 01/12/2016   Trigeminal neuralgia 06/08/2015   Sacroiliac dysfunction 03/04/2014   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 or radiculopathy 05/20/2013   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   Tobacco use disorder 09/11/2012   History of cervical cancer 07/30/2012   Hepatitis C  Past Medical History:  Diagnosis Date   Closed fracture of shaft of humerus 01/20/2017   Diabetes mellitus without complication (HCC)    Difficult intubation    Diverticulitis    Headache    Hepatitis C     treated and resolved   History of kidney stones    Hypertension    Hypotension 08/22/2018   IBS (irritable bowel syndrome)    Kidney stones    Sciatica    Sleep apnea    on cpap   Stroke (HCC) 2017   memory loss   Superficial venous thrombosis of arm, left 07/22/2016   Past Surgical History:  Procedure Laterality Date   ABDOMINAL HYSTERECTOMY  2007   carpel tunnel syndrome  2017   HARDWARE REMOVAL Right 12/22/2017   Procedure: HARDWARE REMOVAL-RIGHT ARM;  Surgeon: Leora Lynwood SAUNDERS, MD;  Location: [facility] OR[facility];  Service: Orthopedics;  Laterality: Right;  Removal of implant, superficial right humerus   HUMERUS IM NAIL Right 03/11/2017   Procedure: INTRAMEDULLARY (IM) NAIL HUMERAL;  Surgeon: Leora Lynwood SAUNDERS, MD;  Location: [facility];  Service: Orthopedics;  Laterality: Right;   KIDNEY STONE SURGERY Right 02/12/2012   KIDNEY STONE SURGERY Left 07/15/2012   LIGATION OF ARTERIOVENOUS  FISTULA Left 06/21/2016   Procedure: LIGATION OF ARTERIOVENOUS  FISTULA ( LIGATION BASILIC VEIN );  Surgeon: Cordella KANDICE Shawl, MD;  Location: [facility];  Service: Vascular;  Laterality: Left;   LITHOTRIPSY     OOPHORECTOMY     SINUS EXPLORATION     TONSILLECTOMY     Social History   Tobacco Use   Smoking status: Every Day    Current packs/day: 1.00    Average packs/day: 1 pack/day for 1.5 years (1.5 ttl pk-yrs)    Types: Cigarettes   Smokeless tobacco: Never  Vaping Use   Vaping status: Never Used  Substance Use Topics   Alcohol use: Not Currently    Comment: no alcohol since 2002   Drug use: No   Social History   Socioeconomic History   Marital status: Legally Separated    Spouse name: Not on file   Number of children: Not on file   Years of education: Not on file   Highest education level: Not on file  Occupational History   Not on file  Tobacco Use   Smoking status: Every Day    Current packs/day: 1.00    Average packs/day: 1 pack/day for 1.5 years (1.5 ttl pk-yrs)    Types:  Cigarettes   Smokeless tobacco: Never  Vaping Use   Vaping status: Never Used  Substance and Sexual Activity   Alcohol use: Not Currently    Comment: no alcohol since 2002   Drug use: No   Sexual activity: Not on file  Other Topics Concern   Not on file  Social History Narrative   Not on file   Social Drivers of Health   Financial Resource Strain: Low Risk  (02/05/2024)   Received from [facility]   Overall Financial Resource Strain (CARDIA)    Difficulty of Paying Living Expenses: Not very hard  Food Insecurity: No Food Insecurity (02/05/2024)   Received from [facility]   Hunger Vital Sign    Within the past 12 months, you worried that your food would run out before you got the money to buy more.: Never true    Within the past 12 months, the food you bought just didn't last and you didn't have money to get  more.: Never true  Transportation Needs: No Transportation Needs (02/17/2024)   PRAPARE - Administrator, Civil Service (Medical): No    Lack of Transportation (Non-Medical): No  Recent Concern: Transportation Needs - Unmet Transportation Needs (02/05/2024)   [facility] from [facility] - Transportation    In the past 12 months, has lack of transportation kept you from medical appointments or from getting medications?: Yes    Lack of Transportation (Non-Medical): No  Physical Activity: Not on file  Stress: Not on file  Social Connections: Not on file  Intimate Partner Violence: Not At Risk (12/31/2023)   Humiliation, Afraid, Rape, and Kick questionnaire    Fear of Current or Ex-Partner: No    Emotionally Abused: No    Physically Abused: No    Sexually Abused: No   Family Status  Relation Name Status   Mother  Deceased   Father  Alive   Sister  Deceased  No partnership data on file   Family History  Problem Relation Age of Onset   Heart failure Mother    Diabetes Father    Cancer Sister     Allergies  Allergen Reactions   Gabapentin  Itching   Ibuprofen  Itching   Sulfa  Antibiotics Hives   Tylenol  [Acetaminophen ] Itching   Aspirin Itching   Buprenorphine Hcl Itching   Carbamazepine Itching, Other (See Comments) and Rash    Makes her feel like something is crawling under her skin.   Compazine  Itching and Other (See Comments)    makes my skin crawl   Elemental Sulfur  Hives, Rash and Other (See Comments)    Skin Rashes, Hives   Metrizamide Itching   Naproxen Itching   Penicillin G Hives, Rash and Other (See Comments)    Has patient had a PCN reaction causing immediate rash, facial/tongue/throat swelling, SOB or lightheadedness with hypotension: Yes Has patient had a PCN reaction causing severe rash involving mucus membranes or skin necrosis: No Has patient had a PCN reaction that required hospitalization: No Has patient had a PCN reaction occurring within the last 10 years: No If all of the above answers are NO, then may proceed with Cephalosporin use.    Reglan  [Metoclopramide ] Itching and Other (See Comments)    Makes her skin crawl   Toradol  [Ketorolac  Tromethamine ] Itching   Tramadol  Itching   Zofran  [Ondansetron  Hcl] Itching      Review of Systems  Genitourinary:  Positive for flank pain. Negative for dysuria, frequency, hematuria and urgency.  Musculoskeletal:  Positive for back pain.  All other systems reviewed and are negative.     Objective:     BP (!) 142/84 (Cuff Size: Large)   Pulse 99   Temp 98.3 F (36.8 C) (Oral)   Resp 16   Ht 5' 2 (1.575 m)   Wt 194 lb 12.8 oz (88.4 kg)   SpO2 100%   BMI 35.63 kg/m  BP Readings from Last 3 Encounters:  06/18/24 (!) 142/84  05/27/24 (!) 140/75  04/16/24 130/80   Wt Readings from Last 3 Encounters:  06/18/24 194 lb 12.8 oz (88.4 kg)  05/26/24 190 lb (86.2 kg)  04/16/24 187 lb 11.2 oz (85.1 kg)      Physical Exam Constitutional:      Appearance: Normal appearance.  HENT:     Head:  Normocephalic and atraumatic.  Eyes:     Conjunctiva/sclera: Conjunctivae normal.  Cardiovascular:     Rate and Rhythm: Normal rate and regular  rhythm.  Pulmonary:     Effort: Pulmonary effort is normal.     Breath sounds: Normal breath sounds.  Skin:    General: Skin is warm and dry.  Neurological:     General: No focal deficit present.     Mental Status: She is alert. Mental status is at baseline.  Psychiatric:        Mood and Affect: Mood normal.        Behavior: Behavior normal.      No results found for any visits on 06/18/24.   Last CBC Lab Results  Component Value Date   WBC 8.0 05/26/2024   HGB 14.4 05/26/2024   HCT 48.5 (H) 05/26/2024   MCV 89.5 05/26/2024   MCH 26.6 05/26/2024   RDW 15.9 (H) 05/26/2024   PLT 203 05/26/2024   Last metabolic panel Lab Results  Component Value Date   GLUCOSE 135 (H) 05/26/2024   NA 141 05/26/2024   K 4.5 05/26/2024   CL 102 05/26/2024   CO2 26 05/26/2024   BUN 6 05/26/2024   CREATININE 0.65 05/26/2024   GFRNONAA >60 05/26/2024   CALCIUM  10.2 05/26/2024   PHOS 2.3 (L) 08/22/2018   PROT 8.0 05/29/2023   ALBUMIN 4.2 05/29/2023   BILITOT 0.4 05/29/2023   ALKPHOS 98 05/29/2023   AST 27 05/29/2023   ALT 34 05/29/2023   ANIONGAP 13 05/26/2024   Last lipids Lab Results  Component Value Date   CHOL 123 02/13/2022   HDL 73 02/13/2022   LDLCALC 35 02/13/2022   TRIG 69 02/13/2022   CHOLHDL 1.7 02/13/2022   Last hemoglobin A1c Lab Results  Component Value Date   HGBA1C 8.3 (A) 01/20/2024   Last thyroid  functions Lab Results  Component Value Date   TSH 1.218 04/16/2022   Last vitamin D  No results found for: MARIEN BOLLS, VD25OH Last vitamin B12 and Folate Lab Results  Component Value Date   VITAMINB12 380 02/20/2022      The ASCVD Risk score (Arnett DK, et al., 2019) failed to calculate for the following reasons:   Risk score cannot be calculated because patient has a medical history  suggesting prior/existing ASCVD    Assessment & Plan:   Assessment & Plan Impending pyelonephritis with bilateral nonobstructive nephrolithiasis and abdominal and back pain CT scan showed multiple tiny nonobstructive stones bilaterally and mild bilateral perinephritic stranding, indicating potential early kidney infection. Antibiotics prescribed based on imaging and symptoms. - Continue ciprofloxacin  500 mg as prescribed. - Administer Toradol  injection for pain management. - Prescribe naproxen 500 mg every 12 hours with food and Benadryl  if itching occurs.  Type 2 diabetes mellitus Blood sugar levels slightly elevated at 204. A1c test needed for current glycemic control assessment. - Check A1c with a finger stick test. - Refill Ozempic  prescription based on A1c results.  Hypertension Blood pressure elevated at 142/84, likely due to pain. Previous readings higher during ER visit. Current regimen includes low doses of amlodipine  and lisinopril . Temporary increase in amlodipine  decided. - Double the dose of amlodipine  to 5 mg for one week. - Monitor blood pressure at home and adjust amlodipine  dose based on readings.  Seizure disorder Out of lamotrigine  with neurology appointment in November. Requires prescription coverage until then. - Prescribe lamotrigine  100 mg twice daily until the neurology appointment in November.  - POCT HgB A1C - lamoTRIgine  (LAMICTAL ) 100 MG tablet; Take 1 tablet (100 mg total) by mouth 2 (two) times daily.  Dispense: 60 tablet; Refill: 1 -  MM 3D SCREENING MAMMOGRAM BILATERAL BREAST; Future - naproxen (NAPROSYN) 500 MG tablet; Take 1 tablet (500 mg total) by mouth 2 (two) times daily with a meal for 10 days.  Dispense: 20 tablet; Refill: 0   Return in about 3 months (around 09/18/2024).    Sharyle Fischer, DO

## 2024-06-21 ENCOUNTER — Other Ambulatory Visit: Payer: Self-pay | Admitting: Internal Medicine

## 2024-06-21 ENCOUNTER — Telehealth: Payer: Self-pay | Admitting: Pharmacy Technician

## 2024-06-21 ENCOUNTER — Other Ambulatory Visit (HOSPITAL_COMMUNITY): Payer: Self-pay

## 2024-06-21 DIAGNOSIS — I10 Essential (primary) hypertension: Secondary | ICD-10-CM

## 2024-06-21 NOTE — Telephone Encounter (Signed)
 Pharmacy Patient Advocate Encounter   Received notification from CoverMyMeds that prior authorization for Ozempic  (0.25 or 0.5 MG/DOSE) 2MG /3ML pen-injectors is due for renewal.   Insurance verification completed.   The patient is insured through [facility] MEDICAID.  Action: Medication is now available without a prior authorization.

## 2024-06-23 NOTE — Telephone Encounter (Signed)
 Requested Prescriptions  Pending Prescriptions Disp Refills   lisinopril  (ZESTRIL ) 10 MG tablet [Pharmacy Med Name: Lisinopril  10 MG Oral Tablet] 90 tablet 0    Sig: Take 1 tablet by mouth once daily     Cardiovascular:  ACE Inhibitors Failed - 06/23/2024  3:59 PM      Failed - Last BP in normal range    BP Readings from Last 1 Encounters:  06/18/24 (!) 142/84         Passed - Cr in normal range and within 180 days    Creat  Date Value Ref Range Status  05/19/2023 0.73 0.50 - 1.03 mg/dL Final   Creatinine, Ser  Date Value Ref Range Status  05/26/2024 0.65 0.44 - 1.00 mg/dL Final   Creatinine, Urine  Date Value Ref Range Status  06/30/2023 118 20 - 275 mg/dL Final         Passed - K in normal range and within 180 days    Potassium  Date Value Ref Range Status  05/26/2024 4.5 3.5 - 5.1 mmol/L Final  03/30/2014 3.7 3.5 - 5.1 mmol/L Final         Passed - Patient is not pregnant      Passed - Valid encounter within last 6 months    Recent Outpatient Visits           5 days ago Seizure disorder [facility])   [place] [facility]ardo Fend, DO   2 months ago Type 2 diabetes mellitus with hyperglycemia, without long-term current use of insulin  [facility])   [facility]ardo Fend, DO   4 months ago COPD exacerbation [facility])   [place] [facility] Bernardo Fend, DO   5 months ago Type 2 diabetes mellitus with hyperglycemia, without long-term current use of insulin  [facility])   [facility] Bernardo Fend, DO   6 months ago Type 2 diabetes mellitus with hyperglycemia, without long-term current use of insulin  [facility])   [facility] Bernardo Fend, [place]

## 2024-06-30 DIAGNOSIS — Z7689 Persons encountering health services in other specified circumstances: Secondary | ICD-10-CM | POA: Diagnosis not present

## 2024-07-02 NOTE — Progress Notes (Signed)
 [facility] Physical Medicine and Rehabilitation    Patient Name:Stephanie [name] MRN: [id number] DOB: 1966/08/11 Age: 58 y.o.    ---------------------------------------------------------------------------------------------------------------------- July 02, 2024 9:55 AM. Documentation assistance provided by Annabelle Moors, medical scribe, at the direction of Karvelas, Kristopher Robert, MD. ----------------------------------------------------------------------------------------------------------------------   ASSESSMENT & PLAN:    10/02/23    DIAGNOSIS:  Today, worsening low back pain:  Lumbar facet mediated pain- not relieved with MBBs at levels tested in the past but pain is multifactorial. Myofascial component today addressed with trigger points.  Neck pain, ED visits for same since last seeing me. Abnormal neuro exam with upper motor neuron signs (also confounding history of stroke).    Right Gluteal Pain Syndrome Right Sacroiliac joint dysfunction Mid-thoracic back pain   TREATMENT PLAN:  Performed trigger point injections today in the low back. See full procedure note below.  Ordered an updated lumbar/thoracic spine MRI due to low back pain not improved with physical therapy and abnormal neuro exam with lower limb brisk reflexes. This can be scheduled on the way out today.  No changes to medications today. Continue cymbalta    NEXT STEPS/FOLLOW UP: 1 month; after MRI  A trigger point injection was performed at 2 sites of maximal tenderness at the bilateral paraspinals. Fenestration was performed (10-15 passes per muscle) and twitch response was noted.  Then the sites were injected using 1% plain Lidocaine . This was well tolerated, and followed by some relief of pain.    SUBJECTIVE:    Chief Complaint:  Back pain 07/01/2024: Pt presents in follow up after following with Dr. Suzy for integrative pain management, beginning physical therapy, and presenting to  the ED multiple times with flank pain. Today pt reports frequent falls and pain she rates as a 9/10. Pt endorses previous MBBs, but never got the RFA. She notes after her second injection she could not lay down in bed and has been sleeping in a recliner since. Pt endorses going to PT in [place], but this exacerbated her symptoms. She recently passed 4 kidney stones, but this did not change her pain. She endorses taking oxycodone  for her pain, which was helpful, but did not resolve her symptoms entirely.  CT Abdomen Pelvis (06/15/2024) Impression  No acute abnormalities involving the abdomen or pelvis or evidence of appendicitis as clinically questioned.   CT Lumbar Spine (07/01/2024) Impression    No evidence of acute lumbar spine fracture or traumatic listhesis.   CT Thoracic Spine (10/02/2023) Impression    No evidence of acute thoracic spine fracture or traumatic listhesis.   CT Cervical Spine (10/02/2023) Impression    No evidence of acute cervical spine fracture or traumatic listhesis.      10/02/2023 Pt presents in f/u after obtaining cervical MRI and cervical x-ray and ordering lumbar MRI. Today pt reports her low back pain is the most bothersome. She has a hard time sleeping and bathing herself due to the pain. She has tried tylenol , advil , excedrin, and BC powder without relief. She is still taking cymbalta . She did fall today before this visit and hit her face, wrist, and knee. She denies blurry vision, head pain aside from where she hit her head, or nausea, but she is planning to go to the ED after this visit.    XR Cervical Spine 11/13/22 IMPRESSION: Moderate degenerative changes of the cervical spine, most prominent at C2-C3 and C5-C6.   MRI Cervical Spine 12/18/22 ([facility]) IMPRESSION: 1. Cervical spondylosis, as outlined.  2. At C3-C4 and C4-C5,  disc bulges efface the ventral thecal sac with mild flattening of the ventral spinal cord. However, the dorsal CSF  space is maintained within the spinal canal at these levels.  3. No more than mild spinal canal narrowing at the remaining levels.  4. Multilevel foraminal stenosis, as detailed and greatest bilaterally at C5-C6 (moderate/severe right, moderate left) and bilaterally at C6-C7 (severe right, moderate/severe left).  5. Disc degeneration is greatest at C5-C6 and C6-C7 (moderate to advanced at these levels).  6. T2 and T3 superior endplate vertebral compression deformities with mild height loss, unchanged from the prior chest CT of 10/23/2022.    CT Lumbar Spine 02/22/23 ([facility]) FINDINGS: Disc levels: Severe intervertebral disc space height loss with a disc bulge at L5-S1. Facet arthropathy. This results in mild bilateral neuroforaminal narrowing. Disc bulge with vacuum disc phenomenon at L4-5 combined with facet arthropathy results in mild bilateral neuroforaminal narrowing. No high-grade canal stenosis.    11/13/2022 Pt presents in f/u after receiving TPI to right trap/levator at last visit and increasing Cymbalta  to 60mg . Today pt endorses pain across her low back with numbness in the hip and right leg as well as across the upper back. States she has been taking cymbalta  without relief but no side effects. Ibuprofen  and tylenol  are unhelpful. Her son states her head can lead pretty far.     09/19/22 Pt presents in f/u after prescribing cymbalta  60mg  daily. Today pt reports she is taking 30mg  and never increased to 60mg .  She notes her low back pain has resolved spontaneously. She is now primarily having right neck/shoulder pain. There is no radiation down the arm. No red flags.   CT Abdomen Pelvis 08/19/22   08/19/22 Pt presents in f/u after prescribing medrol  dosepak and ordering b/l L4-5 and L5-S1 facet injections (did not receive). Today pt reports low back pain has improved. Now having R hip pain and mid-thoracic back pain. Describes pain as non-radiating, aching, and sore. Has not been able  to sleep as a result. Flexion of back does not relieve pain.    06/06/22 Pt presents in f/u after receiving bilateral L4-5 MBBs 01/07/22. These 40% effective. Today pt reports back pain that does not radiate. This has been worsening. She feels better when flexed forward.    She has had falls recently but states she went to ED and has had fractures ruled out.   Ct head 04/17/22 Impression  No acute intracranial abnormality.    12/17/21: Pt presents in f/u after completing L5-S1 ILESI 10/22/21 (Dr. Kinnie) and deferring Lyrica  changes to PCP. Today pt reports L5-S1 ILESI 10/22/21 was not very helpful for back pain and relieved some leg pain w/ only 8% relief. Pt states her back pain has spread bilaterally and has been sleeping in a recliner the past 4 months. She has an appt w/ a new PCP 12/31/21. Deferred medication changes to PCP considering extensive allergies.   MRI LUMBAR SPINE WO CONTRAST 10/31/2021 ([facility]) IMPRESSION:  1. Disc bulging with shallow central disc protrusion L4-5. Mild  subarticular stenosis on the right  2. Moderate to advanced disc degeneration L5-S1 with spurring. Mild  subarticular stenosis bilaterally.    10/03/21: Pt presents in f/u after referral to PT and ordering L5-S1 ILESI. Today pt reports ongoing left back and hip pain that sometimes radiates partially down the leg. States she was called to schedule injection but wanted surgery instead. The pain radiates down posterior leg to the left foot intermittently, usually to the calf with  activity. States she takes Lyrica  (unsure of dose).    History of Present Illness:  Ms. Kittleson is a 58 y.o. year old female being evaluated in consultation at the request of Loa Jama Pinal, MD for Follow-up (Follow up back pain of 8) I have reviewed the referring provider's note. Right low back pain w/ radiation to right lateral thigh proximal to knee. H/o similar pain and sciatica.  Previously followed by Dr. Telhan.    Currently pt  endorses pain in L hip that radiates around the low back and sometimes radiates (3-4 days/week). Denies surgery. Endorses back inj 15 yrs ago. Denies bowel/bladder changes. Endorses swelling in feet. No PT for back.    Symptom Location: low back Symptom Onset/Mechanism: chronic (>/= 3 months) Aggravating Factors: standing Alleviating Factors: rest Night Pain: no Unintended Weight Loss: no Fever/Infection:no Neuromotor Function: motor weakness - (no),  gait/coordination disturbance - (no), loss of bowel or bladder control - (no), saddle anesthesia - (no)    Prior Interventions/Modalities: - lidocaine  patch, unhelpful - NSAIDs - back inj 15 yrs ago - TENS - no PT   Meds: - dilauded (at ED) - valium , unhelpful - Lyrica  200mg    Home Exercise Program: None   Prior Diagnostics: XR Lumbar Spine 12/17/20 I independently reviewed these images today. IMPRESSION: 1. No acute osseous abnormality. 2. Multilevel degenerative disc disease, severe and greatest at L5-S1, unchanged. 3. Right nephrolithiasis.   XR Thoracic Spine 12/17/20 IMPRESSION: No acute osseous abnormality.   CT Lumbar Spine 05/11/20 IMPRESSION: No acute fracture or traumatic listhesis of the lumbar spine. Degenerative disc disease most prominent at L5-S1.   Current Medications        Current Outpatient Medications  Medication Sig Dispense Refill  . ALPRAZolam  (XANAX ) 2 MG tablet Take 1 tablet (2 mg total) by mouth two (2) times a day as needed.      . bisacodyl  (DULCOLAX) 5 mg EC tablet Take 2 tablets (10 mg total) by mouth Take as directed. Take 2 tablets (10 mg total) as directed for bowel prep. 2 tablet 0  . calcium -vitamin D  500 mg-5 mcg (200 unit) per tablet Take 1 tablet by mouth in the morning and 1 tablet in the evening. Take with meals.      . chlorthalidone (HYGROTON) 25 MG tablet Take 1 tablet (25 mg total) by mouth every morning.      . clopidogrel  (PLAVIX ) 75 mg tablet Take 1 tablet (75 mg total) by  mouth daily.      . dapagliflozin (FARXIGA) 10 mg Tab tablet Take 1 tablet (10 mg total) by mouth every morning.      . DULoxetine  (CYMBALTA ) 30 MG capsule Take 2 capsules (60 mg total) by mouth daily. 120 capsule 5  . furosemide  (LASIX ) 40 MG tablet Take 1 tablet (40 mg total) by mouth two (2) times a day.      . loratadine  (CLARITIN ) 10 mg tablet Take 1 tablet (10 mg total) by mouth in the morning.      . losartan  (COZAAR ) 100 MG tablet Take 25 mg by mouth daily.       . lumateperone  (CAPLYTA ) 42 mg cap Take 1 capsule by mouth daily.      . metoprolol  tartrate (LOPRESSOR ) 25 MG tablet Take 1 tablet (25 mg total) by mouth two (2) times a day.      . omeprazole  (PRILOSEC) 40 MG capsule Take 1 capsule (40 mg total) by mouth daily.      . polyethylene glycol (  CLEARLAX) 17 gram/dose powder Take as directed for extended bowel prep. 238 g 0  . promethazine  (PHENERGAN ) 25 MG tablet TAKE 1 TABLET BY MOUTH EVERY 8 HOURS AS NEEDED FOR NAUSEA/ VOMITING      . rosuvastatin  (CRESTOR ) 20 MG tablet Take 1 tablet (20 mg total) by mouth daily.      . semaglutide  (OZEMPIC ) 0.25 mg or 0.5 mg (2 mg/3 mL) PnIj Inject 0.5 mg under the skin every seven (7) days.      . traZODone  (DESYREL ) 100 MG tablet Take 1.5 tablets (150 mg total) by mouth nightly.      . ubrogepant  (UBRELVY ) 50 mg tablet Take 1 tablet (50 mg total) by mouth daily as needed.      SABRA EMGALITY PEN 120 mg/mL injection Inject 120 mg under the skin every thirty (30) days. (Patient not taking: Reported on 10/02/2023)      . fremanezumab -vfrm (AJOVY  AUTOINJECTOR) 225 mg/1.5 mL AtIn Inject 225 mg under the skin every twenty-eight (28) days.      . lamoTRIgine  (LAMICTAL ) 25 MG tablet Take 4 tablets (100 mg total) by mouth Two (2) times a day. 240 tablet 0  . levETIRAcetam  (KEPPRA ) 500 MG tablet Take 0.5 tablets (250 mg total) by mouth Two (2) times a day. 30 tablet 0  . methylPREDNISolone  (MEDROL  DOSEPACK) 4 mg tablet follow package directions (Patient not  taking: Reported on 10/02/2023) 1 each 0    No current facility-administered medications for this visit.        Allergies:  Carbamazepine, Percocet [oxycodone -acetaminophen ], Prochlorperazine  maleate, Acetaminophen , Aspirin, Buprenorphine hcl, Codeine, Gabapentin , Hydrocodone -acetaminophen , Ibuprofen , Ioxaglate meglumine, Ketorolac  tromethamine , Metoclopramide , Metoclopramide  hcl, Metrizamide, Naproxen, Ondansetron  hcl, Opioids - morphine  analogues, Penicillins, Prochlorperazine  edisylate, Sulfa  (sulfonamide antibiotics), Toradol  [ketorolac ], and Tramadol    Past Medical / Surgical History:    Past Medical History      Past Medical History:  Diagnosis Date  . Allergic rhinitis    . Anxiety    . Calcium  nephrolithiasis    . COPD (chronic obstructive pulmonary disease) (CMS-HCC)    . CTS (carpal tunnel syndrome)    . Deep vein thrombosis (CMS-HCC)    . Depression    . Diabetes mellitus (CMS-HCC)    . H/O drug abuse (CMS-HCC)    . Headache(784.0)    . Hepatitis C    . Hypertension    . Infectious viral hepatitis    . Joint pain    . Problem with transportation    . PTSD (post-traumatic stress disorder)    . Reflux esophagitis    . Seizure (CMS-HCC)    . Sinus disease    . Slipped intervertebral disc    . Stroke (CMS-HCC)          Past Surgical History       Past Surgical History:  Procedure Laterality Date  . ABDOMINAL SURGERY      . BACK SURGERY      . CARPAL TUNNEL RELEASE      . CESAREAN SECTION      . ELBOW FRACTURE SURGERY      . FEMUR FRACTURE SURGERY      . HAND SURGERY      . HYSTERECTOMY   1993  . OOPHORECTOMY   2007  . percutaneous nephrolithotomy      . PR COLONOSCOPY FLX DX W/COLLJ SPEC WHEN PFRMD Left 08/12/2023    Procedure: COLONOSCOPY, FLEXIBLE, PROXIMAL TO SPLENIC FLEXURE; DIAGNOSTIC, W/WO COLLECTION SPECIMEN BY BRUSH OR WASH;  Surgeon: Angelena Luke Grey,  MD;  [facility] PROCED[facility] [facility];  Service: Gastroenterology  . PR COLONOSCOPY  W/BIOPSY SINGLE/MULTIPLE   03/17/2014    Procedure: COLONOSCOPY, FLEXIBLE, PROXIMAL TO SPLENIC FLEXURE; WITH BIOPSY, SINGLE OR MULTIPLE;  Surgeon: Sherwood CHRISTELLA Emmer, MD;  [facility];  Service: Gastroenterology  . PR COLONOSCOPY W/BIOPSY SINGLE/MULTIPLE N/A 05/03/2015    Procedure: COLONOSCOPY, FLEXIBLE, PROXIMAL TO SPLENIC FLEXURE; WITH BIOPSY, SINGLE OR MULTIPLE;  Surgeon: Norwood Hose, MD;  Location[facility];  Service: Gastrointestinal  . PR COLONOSCOPY W/BIOPSY SINGLE/MULTIPLE Left 04/26/2022    Procedure: COLONOSCOPY, FLEXIBLE, PROXIMAL TO SPLENIC FLEXURE; WITH BIOPSY, SINGLE OR MULTIPLE;  Surgeon: Luke Cory Notch, MD;  Location: [facility]URES [facility] [facility];  Service: Gastroenterology  . PR COLSC FLX W/RMVL OF TUMOR POLYP LESION SNARE TQ N/A 04/26/2022    Procedure: COLONOSCOPY FLEX; W/REMOV TUMOR/LES BY SNARE;  Surgeon: Luke Cory Notch, MD;  Location: [facility]EDURES [facility] [facility];  Service: Gastroenterology  . PR CYSTO/URETERO/PYELOSCOPY, CALCULUS TX Right 03/31/2019    Procedure: CYSTOURETHROSCOPY, W/URETEROSCOPY &/OR PYELOSCOPY; W/REMOVAL/MANIPULATION CALCULUS(URETERAL CATH INCLUDED);  Surgeon: Nicholaus Mt Viprakasit, MD;  Location: CYSTO PROCEDURE SUITES [facility]  Service: Urology  . PR CYSTOSCOPY,INSERT URETERAL STENT Right 03/31/2019    Procedure: CYSTOURETHROSCOPY,  WITH INSERTION OF INDWELLING URETERAL STENT (EG, GIBBONS OR DOUBLE-J TYPE);  Surgeon: Nicholaus Mt Viprakasit, MD;  Location: [facility] PROCEDURE [facility]IT[facility];  Service: Urology  . PR CYSTOURETHROSCOPY,URETER CATHETER Right 03/31/2019    Procedure: CYSTOURETHROSCOPY, W/URETERAL CATHETERIZATION, W/WO IRRIG, INSTILL, OR URETEROPYELOG, EXCLUS OF RADIOLG SVC;  Surgeon: Nicholaus Mt Viprakasit, MD;  Location: [facility]O PROCEDURE SUIT[facility];  Service: Urology  . PR INCIS TENDON SHEATH,RADIAL STYLOID Left 11/25/2013    Procedure: TENDON SHEATH INCS; RADIAL STYLOID (DEQUERVAIN);  Surgeon: Robynn Beaver, MD;   Location: [facility] OR [facility];  Service: Orthopedics  . PR INCIS TENDON SHEATH,RADIAL STYLOID Right 04/15/2016    Procedure: TENDON SHEATH INCS; RADIAL STYLOID (DEQUERVAIN);  Surgeon: Delon Duwaine Terra Jakie, MD;  Location: ASC OR [facility];  Service: Orthopedics  . PR REVISE MEDIAN N/CARPAL TUNNEL SURG Right 04/15/2016    Procedure: NEUROPLASTY AND/OR TRANSPOSITION; MEDIAN NERVE AT CARPAL TUNNEL;  Surgeon: Delon Duwaine Terra Jakie, MD;  Location: ASC OR [facility];  Service: Orthopedics  . PR UPPER GI ENDOSCOPY,DIAGNOSIS N/A 05/06/2013    Procedure: UGI ENDO, INCLUDE ESOPHAGUS, STOMACH, & DUODENUM &/OR JEJUNUM; DX W/WO COLLECTION SPECIMN, BY BRUSH OR WASH;  Surgeon: Myrick LULLA Keels, MD;  Location: [facility] PROCEDURES [facility] [facility];  Service: Gastroenterology  . SINUS SURGERY      . WRIST FRACTURE SURGERY            Social History  Social History         Socioeconomic History  . Marital status: Legally Separated  Tobacco Use  . Smoking status: Former      Current packs/day: 0.00      Average packs/day: 0.5 packs/day for 38.0 years (19.0 ttl pk-yrs)      Types: Cigarettes      Start date: 02/11/1984      Quit date: 02/07/2022      Years since quitting: 1.6  . Smokeless tobacco: Never  Vaping Use  . Vaping status: Every Day  . Substances: Flavoring  Substance and Sexual Activity  . Alcohol use: No      Alcohol/week: 0.0 standard drinks of alcohol      Comment: denies  . Drug use: No  . Sexual activity: Not Currently  Other Topics Concern  . Exercise Yes  Comment: 1.5 a day at home  . Living Situation Yes      Comment: husband    Social Drivers of Acupuncturist Strain: Medium Risk (05/12/2020)    Overall Financial Resource Strain (CARDIA)    . Difficulty of Paying Living Expenses: Somewhat hard  Food Insecurity: No Food Insecurity (11/12/2022)    Received from [facility] [place]    Hunger Vital Sign    . Worried About Programme Researcher, Broadcasting/film/video in the Last  Year: Never true    . Ran Out of Food in the Last Year: Never true  Transportation Needs: No Transportation Needs (03/17/2023)    Received from [facility] - Transportation    . Lack of Transportation (Medical): No    . Lack of Transportation (Non-Medical): No        Family History       Family History  Problem Relation Age of Onset  . Hypertension Father    . Diabetes Father    . Depression Sister    . Hypertension Sister    . Migraines Sister    . Ovarian cancer Sister 35  . Cancer Sister    . Asthma Mother    . COPD Mother    . Heart disease Mother    . Hypertension Mother    . Migraines Mother    . No Known Problems Brother    . No Known Problems Maternal Aunt    . No Known Problems Maternal Uncle    . No Known Problems Paternal Aunt    . No Known Problems Paternal Uncle    . No Known Problems Maternal Grandmother    . No Known Problems Maternal Grandfather    . No Known Problems Paternal Grandmother    . No Known Problems Paternal Grandfather    . Anesthesia problems Neg Hx    . Broken bones Neg Hx    . Clotting disorder Neg Hx    . Collagen disease Neg Hx    . Dislocations Neg Hx    . Fibromyalgia Neg Hx    . Gout Neg Hx    . Hemophilia Neg Hx    . Osteoporosis Neg Hx    . Rheumatologic disease Neg Hx    . Scoliosis Neg Hx    . Severe sprains Neg Hx    . Sickle cell anemia Neg Hx    . Spinal Compression Fracture Neg Hx    . Breast cancer Neg Hx    . Colon cancer Neg Hx    . Endometrial cancer Neg Hx          For any of the above entries which indicate no records on file, the patient reports no relevant history.               Review of Systems:  Review of Systems was completed through a 10 organ system review and is listed in the chart.  Pertinent positives are noted in HPI or flowsheet and otherwise negative.  Patient has been instructed to followup with PCP or appropriate specialist for symptoms outside the purview of this speciality.    OBJECTIVE:    Vitals:    Temp 36.7 C (98.1 F) (Temporal)   Resp 18   Ht 157.5 cm (5' 2)   Wt 99.3 kg (219 lb)   BMI 40.06 kg/m    The above medications, allergies, history, and ROS, and vitals  have been reviewed.   Physical Exam:  GEN: alert and oriented, no apparent distress HEENT: normocephalic, atraumatic, anicteric, moist mucous membranes CV: normal heart rate PULM: normal work of breathing GI: nondistended EXT: no swelling, edema in b/l UE and LE SKIN: no visible ecchymosis or breakdown PSYCH: normal mood and affect   Physical Exam:  GEN: alert and oriented, no apparent distress HEENT: normocephalic, atraumatic, anicteric, moist mucous membranes CV: normal heart rate PULM: normal work of breathing GI: nondistended EXT: no swelling, edema in b/l UE and LE SKIN: no visible ecchymosis or breakdown PSYCH: normal mood and affect   MSK: Manual Muscle Testing    Left Lower Extremity Right Lower Extremity  Hip Flexion  5/5 5/5  Knee Extension  5/5 5/5  Ankle Dorsiflexion  5/5 5/5  Toe Flexion 5/5 5/5  EHL Extension  5/5 5/5    Reflexes     Left Upper Extremity Right Upper Extremity   Triceps 2+ 2+  Biceps 2+ 2+  Brachioradialis 2+ 2+  Hoffman's  negative negative     Left Lower Extremity Right Lower Extremity   Patellar 3+ 3+  Achilles  2+ 1+  Babinski negative negative     Lumbar Spine: Inspection: Normal alignment. No erythema, discoloration, or asymmetry. Palpation: TTP over lumbar paraspinals.  No vertebral body point tenderness. ROM: Pain worse with extension. Facet Loading Pain: - (positive) left, - (positive) right   ---------------------------------------------------------------------------------------------------------------------- July 02, 2024 9:55 AM. Documentation assistance provided by Annabelle Moors, medical scribe, at the direction of Karvelas, Luetta Charleston,  MD. ----------------------------------------------------------------------------------------------------------------------

## 2024-07-05 DIAGNOSIS — M479 Spondylosis, unspecified: Secondary | ICD-10-CM | POA: Diagnosis not present

## 2024-07-05 DIAGNOSIS — Z7689 Persons encountering health services in other specified circumstances: Secondary | ICD-10-CM | POA: Diagnosis not present

## 2024-07-09 DIAGNOSIS — F4312 Post-traumatic stress disorder, chronic: Secondary | ICD-10-CM | POA: Diagnosis not present

## 2024-07-09 DIAGNOSIS — F410 Panic disorder [episodic paroxysmal anxiety] without agoraphobia: Secondary | ICD-10-CM | POA: Diagnosis not present

## 2024-07-09 DIAGNOSIS — F319 Bipolar disorder, unspecified: Secondary | ICD-10-CM | POA: Diagnosis not present

## 2024-07-09 DIAGNOSIS — F411 Generalized anxiety disorder: Secondary | ICD-10-CM | POA: Diagnosis not present

## 2024-07-13 ENCOUNTER — Telehealth: Payer: Self-pay

## 2024-07-13 ENCOUNTER — Telehealth: Payer: Self-pay | Admitting: Pharmacy Technician

## 2024-07-13 ENCOUNTER — Ambulatory Visit: Payer: Self-pay

## 2024-07-13 ENCOUNTER — Other Ambulatory Visit (HOSPITAL_COMMUNITY): Payer: Self-pay

## 2024-07-13 NOTE — Telephone Encounter (Signed)
 Pharmacy Patient Advocate Encounter   Received notification from CoverMyMeds that prior authorization for Ozempic  (2 MG/DOSE) 8MG /3ML pen-injectors is required/requested.   Insurance verification completed.   The patient is insured through [facility] MEDICAID.   Per test claim: PA required; PA submitted to above mentioned insurance via Latent Key/confirmation #/EOC [id number] Status is pending

## 2024-07-13 NOTE — Telephone Encounter (Signed)
 Copied from CRM #[id number]. Topic: General - Other >> Jul 13, 2024 10:33 AM Emylou G wrote: Reason for CRM: Please send over documentation that okay to stop plavix  before her procedure.. They are okay w/phone call too:  [facility]: Colonoscopy.. their number: [phone] .[facility] Fax  [phone] - but patient said to call it's faster

## 2024-07-13 NOTE — Telephone Encounter (Signed)
 FYI Only or Action Required?: FYI only for provider: appointment scheduled on 07/16/2024.  Patient was last seen in primary care on 06/18/2024 by Bernardo Fend, DO.  Called Nurse Triage reporting Hypertension.  Symptoms began yesterday.  Interventions attempted: Nothing.  Symptoms are: gradually worsening.  Triage Disposition: See PCP When Office is Open (Within 3 Days)  Patient/caregiver understands and will follow disposition?: Yes      Copied from CRM #[id number]. Topic: Clinical - Red Word Triage >> Jul 13, 2024  9:19 AM Winona R wrote: Blood pressure 185/101 yesterday pt states BP wont go down, can't find her bp monitor today but she would like to schedule a follow up with DO Bernardo. Pt Er visit on 10/07 was for high blood pressure and Cystitis Reason for Disposition  Systolic BP >= 160 OR Diastolic >= 100  Answer Assessment - Initial Assessment Questions Advised to go to ED for worsening symptoms.   1. BLOOD PRESSURE: What is your blood pressure? Did you take at least two measurements 5 minutes apart?      185/101 unable to take another reading today due to misplacing BP monitor  2. ONSET: When did you take your blood pressure?     Yesterday  3. HOW: How did you take your blood pressure? (e.g., automatic home BP monitor, visiting nurse)     BP monitor at home  4. HISTORY: Do you have a history of high blood pressure?     Yes  5. MEDICINES: Are you taking any medicines for blood pressure? Have you missed any doses recently?     Yes taking BP meds denies missing doses  6. OTHER SYMPTOMS: Do you have any symptoms? (e.g., blurred vision, chest pain, difficulty breathing, headache, weakness)     Denies HR racing as well  Protocols used: Blood Pressure - High-A-AH

## 2024-07-13 NOTE — Telephone Encounter (Signed)
 Pharmacy Patient Advocate Encounter  Received notification from [facility] MEDICAID that Prior Authorization for Ozempic  (2 MG/DOSE) 8MG /3ML pen-injectors has been APPROVED from 07/13/24 to 07/13/25. Ran test claim, Copay is $4.00. This test claim was processed through [facility]- copay amounts may vary at other pharmacies due to pharmacy/plan contracts, or as the patient moves through the different stages of their insurance plan.   PA #/Case ID/Reference #: [id number]

## 2024-07-15 NOTE — Telephone Encounter (Signed)
 Verbal order called and letter also sent.

## 2024-07-16 ENCOUNTER — Inpatient Hospital Stay: Admitting: Internal Medicine

## 2024-07-19 ENCOUNTER — Other Ambulatory Visit: Payer: Self-pay | Admitting: Internal Medicine

## 2024-07-19 DIAGNOSIS — E1165 Type 2 diabetes mellitus with hyperglycemia: Secondary | ICD-10-CM

## 2024-07-19 DIAGNOSIS — G40909 Epilepsy, unspecified, not intractable, without status epilepticus: Secondary | ICD-10-CM

## 2024-07-21 NOTE — Telephone Encounter (Signed)
 Too soon for refill.  Requested Prescriptions  Pending Prescriptions Disp Refills   metFORMIN  (GLUCOPHAGE -XR) 500 MG 24 hr tablet [Pharmacy Med Name: metFORMIN  HCl ER 500 MG Oral Tablet Extended Release 24 Hour] 180 tablet 0    Sig: TAKE 1 TABLET BY MOUTH TWICE DAILY WITH A MEAL     Endocrinology:  Diabetes - Biguanides Failed - 07/21/2024  9:45 AM      Failed - B12 Level in normal range and within 720 days    Vitamin B-12  Date Value Ref Range Status  02/20/2022 380 200 - 1,100 pg/mL Final    Comment:    . Please Note: Although the reference range for vitamin B12 is [phone] pg/mL, it has been reported that between 5 and 10% of patients with values between 200 and 400 pg/mL may experience neuropsychiatric and hematologic abnormalities due to occult B12 deficiency; less than 1% of patients with values above 400 pg/mL will have symptoms. .          Passed - Cr in normal range and within 360 days    Creat  Date Value Ref Range Status  05/19/2023 0.73 0.50 - 1.03 mg/dL Final   Creatinine, Ser  Date Value Ref Range Status  05/26/2024 0.65 0.44 - 1.00 mg/dL Final   Creatinine, Urine  Date Value Ref Range Status  06/30/2023 118 20 - 275 mg/dL Final         Passed - HBA1C is between 0 and 7.9 and within 180 days    Hemoglobin A1C  Date Value Ref Range Status  06/18/2024 7.1 (A) 4.0 - 5.6 % Final   Hgb A1c MFr Bld  Date Value Ref Range Status  04/15/2023 7.2 (H) <5.7 % of total Hgb Final    Comment:    For someone without known diabetes, a hemoglobin A1c value of 6.5% or greater indicates that they may have  diabetes and this should be confirmed with a follow-up  test. . For someone with known diabetes, a value <7% indicates  that their diabetes is well controlled and a value  greater than or equal to 7% indicates suboptimal  control. A1c targets should be individualized based on  duration of diabetes, age, comorbid conditions, and  other  considerations. . Currently, no consensus exists regarding use of hemoglobin A1c for diagnosis of diabetes for children. .          Passed - eGFR in normal range and within 360 days    EGFR (African American)  Date Value Ref Range Status  03/30/2014 >60  Final   GFR calc Af Amer  Date Value Ref Range Status  04/20/2020 >60 >60 mL/min Final   EGFR (Non-African Amer.)  Date Value Ref Range Status  03/30/2014 >60  Final    Comment:    eGFR values <21mL/min/1.73 m2 may be an indication of chronic kidney disease (CKD). Calculated eGFR is useful in patients with stable renal function. The eGFR calculation will not be reliable in acutely ill patients when serum creatinine is changing rapidly. It is not useful in  patients on dialysis. The eGFR calculation may not be applicable to patients at the low and high extremes of body sizes, pregnant women, and vegetarians.    GFR, Estimated  Date Value Ref Range Status  05/26/2024 >60 >60 mL/min Final    Comment:    (NOTE) Calculated using the CKD-EPI Creatinine Equation (2021)    eGFR  Date Value Ref Range Status  03/29/2022 83 > OR =  60 mL/min/1.43m2 Final    Comment:    The eGFR is based on the CKD-EPI 2021 equation. To calculate  the new eGFR from a previous Creatinine or Cystatin C result, go to https://www.kidney.org/professionals/ kdoqi/gfr%5Fcalculator          Passed - Valid encounter within last 6 months    Recent Outpatient Visits           1 month ago Seizure disorder [facility])   [place] [facility] Bernardo Fend, DO   3 months ago Type 2 diabetes mellitus with hyperglycemia, without long-term current use of insulin  [facility])   [facility] Bernardo Fend, DO   5 months ago COPD exacerbation [facility])   [place] [facility] Bern[facility] Fend, DO   6 months ago Type 2 diabetes mellitus with hyperglycemia, without long-term current use of insulin  [facility])    [place] [facility] Bernardo Fend, DO   7 months ago Type 2 diabetes mellitus with hyperglycemia, without long-term current use of insulin  [facility])   [place] [place]end, [place]              Passed - CBC within normal limits and completed in the last 12 months    WBC  Date Value Ref Range Status  05/26/2024 8.0 4.0 - 10.5 K/uL Final   RBC  Date Value Ref Range Status  05/26/2024 5.42 (H) 3.87 - 5.11 MIL/uL Final   Hemoglobin  Date Value Ref Range Status  05/26/2024 14.4 12.0 - 15.0 g/dL Final  89/80/7982 84.8 11.1 - 15.9 g/dL Final   HCT  Date Value Ref Range Status  05/26/2024 48.5 (H) 36.0 - 46.0 % Final   Hematocrit  Date Value Ref Range Status  06/27/2016 44.9 34.0 - 46.6 % Final   MCHC  Date Value Ref Range Status  05/26/2024 29.7 (L) 30.0 - 36.0 g/dL Final   [facility]  Date Value Ref Range Status  05/26/2024 26.6 26.0 - 34.0 pg Final   MCV  Date Value Ref Range Status  05/26/2024 89.5 80.0 - 100.0 fL Final  06/27/2016 95 79 - 97 fL Final  03/30/2014 94 80 - 100 fL Final   No results found for: PLTCOUNTKUC, LABPLAT, POCPLA RDW  Date Value Ref Range Status  05/26/2024 15.9 (H) 11.5 - 15.5 % Final  06/27/2016 13.9 12.3 - 15.4 % Final  03/30/2014 13.8 11.5 - 14.5 % Final          lamoTRIgine  (LAMICTAL ) 100 MG tablet [Pharmacy Med Name: lamoTRIgine  100 MG Oral Tablet] 60 tablet 0    Sig: Take 1 tablet by mouth twice daily     Neurology:  Anticonvulsants - lamotrigine  Failed - 07/21/2024  9:45 AM      Failed - ALT in normal range and within 360 days    ALT  Date Value Ref Range Status  05/29/2023 34 0 - 44 U/L Final   SGPT (ALT)  Date Value Ref Range Status  03/30/2014 29 U/L Final    Comment:    14-63 NOTE: New Reference Range 03/29/14          Failed - AST in normal range and within 360 days    AST  Date Value Ref Range Status  05/29/2023 27 15 - 41 U/L Final   SGOT(AST)  Date  Value Ref Range Status  03/30/2014 23 15 - 37 Unit/L Final         Passed - Cr in normal  range and within 360 days    Creat  Date Value Ref Range Status  05/19/2023 0.73 0.50 - 1.03 mg/dL Final   Creatinine, Ser  Date Value Ref Range Status  05/26/2024 0.65 0.44 - 1.00 mg/dL Final   Creatinine, Urine  Date Value Ref Range Status  06/30/2023 118 20 - 275 mg/dL Final         Passed - Completed PHQ-2 or PHQ-9 in the last 360 days      Passed - Valid encounter within last 12 months    Recent Outpatient Visits           1 month ago Seizure disorder [facility])   [facility] Fend, DO   3 months ago Type 2 diabetes mellitus with hyperglycemia, without long-term current use of insulin  [facility])   [facility] Bernardo Fend, DO   5 months ago COPD exacerbation [facility])   [facility] Bernardo Fend, DO   6 months ago Type 2 diabetes mellitus with hyperglycemia, without long-term current use of insulin  [facility])   [facility] Bernardo Fend, DO   7 months ago Type 2 diabetes mellitus with hyperglycemia, without long-term current use of insulin  [facility])   [facility] Bernardo Fend, [place]

## 2024-07-26 DIAGNOSIS — J449 Chronic obstructive pulmonary disease, unspecified: Secondary | ICD-10-CM | POA: Diagnosis not present

## 2024-07-26 DIAGNOSIS — R911 Solitary pulmonary nodule: Secondary | ICD-10-CM | POA: Diagnosis not present

## 2024-07-26 DIAGNOSIS — Z23 Encounter for immunization: Secondary | ICD-10-CM | POA: Diagnosis not present

## 2024-07-26 DIAGNOSIS — F17210 Nicotine dependence, cigarettes, uncomplicated: Secondary | ICD-10-CM | POA: Diagnosis not present

## 2024-08-07 ENCOUNTER — Emergency Department

## 2024-08-07 ENCOUNTER — Other Ambulatory Visit: Payer: Self-pay

## 2024-08-07 ENCOUNTER — Emergency Department
Admission: EM | Admit: 2024-08-07 | Discharge: 2024-08-07 | Disposition: A | Discharge status: Home / Self Care | Attending: Emergency Medicine | Admitting: Emergency Medicine

## 2024-08-07 DIAGNOSIS — Z8541 Personal history of malignant neoplasm of cervix uteri: Secondary | ICD-10-CM | POA: Insufficient documentation

## 2024-08-07 DIAGNOSIS — Z7902 Long term (current) use of antithrombotics/antiplatelets: Secondary | ICD-10-CM | POA: Diagnosis not present

## 2024-08-07 DIAGNOSIS — M542 Cervicalgia: Secondary | ICD-10-CM | POA: Insufficient documentation

## 2024-08-07 DIAGNOSIS — E119 Type 2 diabetes mellitus without complications: Secondary | ICD-10-CM | POA: Insufficient documentation

## 2024-08-07 DIAGNOSIS — R519 Headache, unspecified: Secondary | ICD-10-CM | POA: Insufficient documentation

## 2024-08-07 DIAGNOSIS — I10 Essential (primary) hypertension: Secondary | ICD-10-CM | POA: Diagnosis not present

## 2024-08-07 DIAGNOSIS — S199XXA Unspecified injury of neck, initial encounter: Secondary | ICD-10-CM | POA: Diagnosis not present

## 2024-08-07 DIAGNOSIS — S0990XA Unspecified injury of head, initial encounter: Secondary | ICD-10-CM | POA: Diagnosis not present

## 2024-08-07 DIAGNOSIS — M5136 Other intervertebral disc degeneration, lumbar region with discogenic back pain only: Secondary | ICD-10-CM | POA: Diagnosis not present

## 2024-08-07 DIAGNOSIS — E278 Other specified disorders of adrenal gland: Secondary | ICD-10-CM | POA: Diagnosis not present

## 2024-08-07 DIAGNOSIS — M47812 Spondylosis without myelopathy or radiculopathy, cervical region: Secondary | ICD-10-CM | POA: Diagnosis not present

## 2024-08-07 DIAGNOSIS — M549 Dorsalgia, unspecified: Secondary | ICD-10-CM | POA: Diagnosis not present

## 2024-08-07 DIAGNOSIS — Y92410 Unspecified street and highway as the place of occurrence of the external cause: Secondary | ICD-10-CM | POA: Diagnosis not present

## 2024-08-07 DIAGNOSIS — N3000 Acute cystitis without hematuria: Secondary | ICD-10-CM | POA: Diagnosis not present

## 2024-08-07 DIAGNOSIS — M791 Myalgia, unspecified site: Secondary | ICD-10-CM | POA: Diagnosis not present

## 2024-08-07 DIAGNOSIS — I700 Atherosclerosis of aorta: Secondary | ICD-10-CM | POA: Diagnosis not present

## 2024-08-07 DIAGNOSIS — Z720 Tobacco use: Secondary | ICD-10-CM | POA: Diagnosis not present

## 2024-08-07 DIAGNOSIS — T07XXXA Unspecified multiple injuries, initial encounter: Secondary | ICD-10-CM | POA: Diagnosis not present

## 2024-08-07 DIAGNOSIS — M7918 Myalgia, other site: Secondary | ICD-10-CM

## 2024-08-07 DIAGNOSIS — G9389 Other specified disorders of brain: Secondary | ICD-10-CM | POA: Diagnosis not present

## 2024-08-07 LAB — URINALYSIS, ROUTINE W REFLEX MICROSCOPIC
Bilirubin Urine: NEGATIVE
Glucose, UA: 50 mg/dL — AB
Hgb urine dipstick: NEGATIVE
Ketones, ur: NEGATIVE mg/dL
Nitrite: NEGATIVE
Protein, ur: NEGATIVE mg/dL
Specific Gravity, Urine: 1.003 — ABNORMAL LOW (ref 1.005–1.030)
WBC, UA: >50 WBC/hpf (ref 0–5)
pH: 6 (ref 5.0–8.0)

## 2024-08-07 LAB — COMPREHENSIVE METABOLIC PANEL WITH GFR
ALT: 13 U/L (ref 0–44)
AST: 16 U/L (ref 15–41)
Albumin: 4.2 g/dL (ref 3.5–5.0)
Alkaline Phosphatase: 115 U/L (ref 38–126)
Anion gap: 10 (ref 5–15)
BUN: 8 mg/dL (ref 6–20)
CO2: 29 mmol/L (ref 22–32)
Calcium: 9.9 mg/dL (ref 8.9–10.3)
Chloride: 105 mmol/L (ref 98–111)
Creatinine, Ser: 0.68 mg/dL (ref 0.44–1.00)
GFR, Estimated: >60 mL/min (ref >60)
Glucose, Bld: 144 mg/dL — ABNORMAL HIGH (ref 70–99)
Potassium: 4 mmol/L (ref 3.5–5.1)
Sodium: 143 mmol/L (ref 135–145)
Total Bilirubin: 0.3 mg/dL (ref 0.0–1.2)
Total Protein: 7.3 g/dL (ref 6.5–8.1)

## 2024-08-07 LAB — LIPASE, BLOOD: Lipase: 69 U/L — ABNORMAL HIGH (ref 11–51)

## 2024-08-07 MED ORDER — IOHEXOL 300 MG/ML  SOLN
100.0000 mL | Freq: Once | INTRAMUSCULAR | Status: AC | PRN
  Administered 2024-08-07: 100 mL via INTRAVENOUS

## 2024-08-07 MED ORDER — LIDOCAINE 5 % EX PTCH
1.0000 | MEDICATED_PATCH | CUTANEOUS | Status: DC
  Filled 2024-08-07: qty 1

## 2024-08-07 MED ORDER — MORPHINE SULFATE (PF) 4 MG/ML IV SOLN
4.0000 mg | Freq: Once | INTRAVENOUS | Status: AC
  Administered 2024-08-07: 4 mg via INTRAVENOUS
  Filled 2024-08-07: qty 1

## 2024-08-07 MED ORDER — CEPHALEXIN 500 MG PO CAPS: 500.0000 mg | ORAL_CAPSULE | Freq: Four times a day (QID) | ORAL | 0 refills | Status: AC

## 2024-08-07 NOTE — Discharge Instructions (Signed)
 Your CT scans are normal without any signs of acute injury.  It does appear that you have a urinary tract infection.  Please take the antibiotics as prescribed.  Please return for any new, worsening, or changing symptoms or other concerns.

## 2024-08-07 NOTE — ED Notes (Signed)
 Pt refused lido patch. Pt states they don't work. I need something else of pain. This RN will notify provider.
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
## 2024-08-07 NOTE — ED Notes (Signed)
 Pt ambulatory to bathroom with steady gait.

## 2024-08-10 ENCOUNTER — Encounter

## 2024-08-10 ENCOUNTER — Other Ambulatory Visit: Payer: Self-pay | Admitting: Internal Medicine

## 2024-08-10 DIAGNOSIS — G40909 Epilepsy, unspecified, not intractable, without status epilepticus: Secondary | ICD-10-CM

## 2024-08-12 NOTE — Telephone Encounter (Signed)
 Requested Prescriptions  Pending Prescriptions Disp Refills   lamoTRIgine  (LAMICTAL ) 100 MG tablet [Pharmacy Med Name: lamoTRIgine  100 MG Oral Tablet] 60 tablet 1    Sig: Take 1 tablet by mouth twice daily     Neurology:  Anticonvulsants - lamotrigine  Failed - 08/12/2024  5:25 PM      Failed - Completed PHQ-2 or PHQ-9 in the last 360 days      Passed - Cr in normal range and within 360 days    Creat  Date Value Ref Range Status  05/19/2023 0.73 0.50 - 1.03 mg/dL Final   Creatinine, Ser  Date Value Ref Range Status  08/07/2024 0.68 0.44 - 1.00 mg/dL Final   Creatinine, Urine  Date Value Ref Range Status  06/30/2023 118 20 - 275 mg/dL Final         Passed - ALT in normal range and within 360 days    ALT  Date Value Ref Range Status  08/07/2024 13 0 - 44 U/L Final   SGPT (ALT)  Date Value Ref Range Status  03/30/2014 29 U/L Final    Comment:    14-63 NOTE: New Reference Range 03/29/14          Passed - AST in normal range and within 360 days    AST  Date Value Ref Range Status  08/07/2024 16 15 - 41 U/L Final   SGOT(AST)  Date Value Ref Range Status  03/30/2014 23 15 - 37 Unit/L Final         Passed - Valid encounter within last 12 months    Recent Outpatient Visits           1 month ago Seizure disorder [facility])   [facility] Bernardo Fend, DO   3 months ago Type 2 diabetes mellitus with hyperglycemia, without long-term current use of insulin  [facility])   [facility] Bernardo Fend, DO   6 months ago COPD exacerbation [facility])   [place] [facility] Bernardo Fend, DO   6 months ago Type 2 diabetes mellitus with hyperglycemia, without long-term current use of insulin  [facility])   [facility] Bernardo Fend, DO   8 months ago Type 2 diabetes mellitus with hyperglycemia, without long-term current use of insulin  [facility])   [facility]  Bernardo Fend, [place]

## 2024-08-16 DIAGNOSIS — D120 Benign neoplasm of cecum: Secondary | ICD-10-CM | POA: Diagnosis not present

## 2024-08-16 DIAGNOSIS — E785 Hyperlipidemia, unspecified: Secondary | ICD-10-CM | POA: Diagnosis not present

## 2024-08-16 DIAGNOSIS — J449 Chronic obstructive pulmonary disease, unspecified: Secondary | ICD-10-CM | POA: Diagnosis not present

## 2024-08-16 DIAGNOSIS — Z1211 Encounter for screening for malignant neoplasm of colon: Secondary | ICD-10-CM | POA: Diagnosis not present

## 2024-08-16 DIAGNOSIS — K635 Polyp of colon: Secondary | ICD-10-CM | POA: Diagnosis not present

## 2024-08-16 DIAGNOSIS — D123 Benign neoplasm of transverse colon: Secondary | ICD-10-CM | POA: Diagnosis not present

## 2024-08-16 DIAGNOSIS — D122 Benign neoplasm of ascending colon: Secondary | ICD-10-CM | POA: Diagnosis not present

## 2024-08-16 DIAGNOSIS — E1122 Type 2 diabetes mellitus with diabetic chronic kidney disease: Secondary | ICD-10-CM | POA: Diagnosis not present

## 2024-08-16 DIAGNOSIS — Z6834 Body mass index (BMI) 34.0-34.9, adult: Secondary | ICD-10-CM | POA: Diagnosis not present

## 2024-08-16 DIAGNOSIS — K2100 Gastro-esophageal reflux disease with esophagitis, without bleeding: Secondary | ICD-10-CM | POA: Diagnosis not present

## 2024-08-16 DIAGNOSIS — N189 Chronic kidney disease, unspecified: Secondary | ICD-10-CM | POA: Diagnosis not present

## 2024-08-16 DIAGNOSIS — Z860101 Personal history of adenomatous and serrated colon polyps: Secondary | ICD-10-CM | POA: Diagnosis not present

## 2024-08-16 DIAGNOSIS — I129 Hypertensive chronic kidney disease with stage 1 through stage 4 chronic kidney disease, or unspecified chronic kidney disease: Secondary | ICD-10-CM | POA: Diagnosis not present

## 2024-08-16 DIAGNOSIS — K648 Other hemorrhoids: Secondary | ICD-10-CM | POA: Diagnosis not present

## 2024-08-16 DIAGNOSIS — I10 Essential (primary) hypertension: Secondary | ICD-10-CM | POA: Diagnosis not present

## 2024-08-16 DIAGNOSIS — D128 Benign neoplasm of rectum: Secondary | ICD-10-CM | POA: Diagnosis not present

## 2024-08-16 DIAGNOSIS — K621 Rectal polyp: Secondary | ICD-10-CM | POA: Diagnosis not present

## 2024-08-16 DIAGNOSIS — E119 Type 2 diabetes mellitus without complications: Secondary | ICD-10-CM | POA: Diagnosis not present

## 2024-08-16 DIAGNOSIS — K5730 Diverticulosis of large intestine without perforation or abscess without bleeding: Secondary | ICD-10-CM | POA: Diagnosis not present

## 2024-08-18 DIAGNOSIS — D123 Benign neoplasm of transverse colon: Secondary | ICD-10-CM | POA: Diagnosis not present

## 2024-08-18 DIAGNOSIS — Z1211 Encounter for screening for malignant neoplasm of colon: Secondary | ICD-10-CM | POA: Diagnosis not present

## 2024-08-18 DIAGNOSIS — D120 Benign neoplasm of cecum: Secondary | ICD-10-CM | POA: Diagnosis not present

## 2024-08-18 DIAGNOSIS — D128 Benign neoplasm of rectum: Secondary | ICD-10-CM | POA: Diagnosis not present

## 2024-08-23 ENCOUNTER — Other Ambulatory Visit: Payer: Self-pay | Admitting: Internal Medicine

## 2024-08-23 DIAGNOSIS — E1165 Type 2 diabetes mellitus with hyperglycemia: Secondary | ICD-10-CM

## 2024-08-26 NOTE — Telephone Encounter (Signed)
 Requested Prescriptions  Pending Prescriptions Disp Refills   Continuous Glucose Sensor (DEXCOM G7 SENSOR) MISC [Pharmacy Med Name: Dexcom G7 Sensor Miscellaneous] 9 each 0    Sig: USE AS DIRECTED     Endocrinology: Diabetes - Testing Supplies Passed - 08/26/2024  1:16 PM      Passed - Valid encounter within last 12 months    Recent Outpatient Visits           2 months ago Seizure disorder [facility])   [place] [facility] Bernardo Fend, [place]   4 months ago Type 2 diabetes mellitus with hyperglycemia, without long-term current use of insulin  [facility])   [facility] Bernardo Fend, DO   6 months ago COPD exacerbation [facility])   [facility] Bernardo Fend, DO   7 months ago Type 2 diabetes mellitus with hyperglycemia, without long-term current use of insulin  [facility])   [facility] Bernardo Fend, DO   8 months ago Type 2 diabetes mellitus with hyperglycemia, without long-term current use of insulin  [facility])   [facility] Bernardo Fend, [place]

## 2024-09-01 ENCOUNTER — Other Ambulatory Visit: Payer: Self-pay | Admitting: Internal Medicine

## 2024-09-01 DIAGNOSIS — E1165 Type 2 diabetes mellitus with hyperglycemia: Secondary | ICD-10-CM

## 2024-09-02 ENCOUNTER — Other Ambulatory Visit: Payer: Self-pay | Admitting: Internal Medicine

## 2024-09-02 DIAGNOSIS — E1165 Type 2 diabetes mellitus with hyperglycemia: Secondary | ICD-10-CM

## 2024-09-03 ENCOUNTER — Other Ambulatory Visit: Payer: Self-pay | Admitting: Internal Medicine

## 2024-09-03 DIAGNOSIS — E1165 Type 2 diabetes mellitus with hyperglycemia: Secondary | ICD-10-CM

## 2024-09-03 NOTE — Telephone Encounter (Signed)
 Requested Prescriptions  Pending Prescriptions Disp Refills   OZEMPIC , 2 MG/DOSE, 8 MG/3ML SOPN [Pharmacy Med Name: Ozempic  (2 MG/DOSE) 8 MG/3ML Subcutaneous Solution Pen-injector] 3 mL 1    Sig: INJECT 2MG  UNDER THE SKIN AS DIRECTED ONCE WEEKLY     Endocrinology:  Diabetes - GLP-1 Receptor Agonists - semaglutide  Failed - 09/03/2024  6:24 PM      Failed - HBA1C in normal range and within 180 days    Hemoglobin A1C  Date Value Ref Range Status  06/18/2024 7.1 (A) 4.0 - 5.6 % Final   Hgb A1c MFr Bld  Date Value Ref Range Status  04/15/2023 7.2 (H) <5.7 % of total Hgb Final    Comment:    For someone without known diabetes, a hemoglobin A1c value of 6.5% or greater indicates that they may have  diabetes and this should be confirmed with a follow-up  test. . For someone with known diabetes, a value <7% indicates  that their diabetes is well controlled and a value  greater than or equal to 7% indicates suboptimal  control. A1c targets should be individualized based on  duration of diabetes, age, comorbid conditions, and  other considerations. . Currently, no consensus exists regarding use of hemoglobin A1c for diagnosis of diabetes for children. .          Passed - Cr in normal range and within 360 days    Creat  Date Value Ref Range Status  05/19/2023 0.73 0.50 - 1.03 mg/dL Final   Creatinine, Ser  Date Value Ref Range Status  08/07/2024 0.68 0.44 - 1.00 mg/dL Final   Creatinine, Urine  Date Value Ref Range Status  06/30/2023 118 20 - 275 mg/dL Final         Passed - Valid encounter within last 6 months    Recent Outpatient Visits           2 months ago Seizure disorder [facility])   [place] [facility]end, DO   4 months ago Type 2 diabetes mellitus with hyperglycemia, without long-term current use of insulin  [facility])   [facility]end, DO   7 months ago COPD exacerbation [facility])   [facility]  [facility] Bernardo Fend, DO   7 months ago Type 2 diabetes mellitus with hyperglycemia, without long-term current use of insulin  [facility])   [facility] Fend, DO   9 months ago Type 2 diabetes mellitus with hyperglycemia, without long-term current use of insulin  [facility])   [facility] Bern[place] Fend, [place]

## 2024-09-03 NOTE — Telephone Encounter (Signed)
 Requested medication (s) are due for refill today: No  Requested medication (s) are on the active medication list: Yes  Last refill:  08/26/24  Future visit scheduled: Yes  Notes to clinic:  See pharmacy request.    Requested Prescriptions  Pending Prescriptions Disp Refills   Continuous Glucose Sensor (DEXCOM G7 SENSOR) MISC [Pharmacy Med Name: OTHELIA PANTHER SENSOR    MIS] 9 each 0    Sig: USE AS DIRECTED     Endocrinology: Diabetes - Testing Supplies Passed - 09/03/2024  2:17 PM      Passed - Valid encounter within last 12 months    Recent Outpatient Visits           2 months ago Seizure disorder [facility])   [facility], DO   4 months ago Type 2 diabetes mellitus with hyperglycemia, without long-term current use of insulin  [facility])   [facility] Fend, DO   7 months ago COPD exacerbation [facility])   [facility] Bernardo Fend, DO   7 months ago Type 2 diabetes mellitus with hyperglycemia, without long-term current use of insulin  [facility])   [facility] Fend, DO   9 months ago Type 2 diabetes mellitus with hyperglycemia, without long-term current use of insulin  [facility])   [facility] Bernardo Fend, [place]

## 2024-09-06 DIAGNOSIS — R911 Solitary pulmonary nodule: Secondary | ICD-10-CM | POA: Diagnosis not present

## 2024-09-06 DIAGNOSIS — I10 Essential (primary) hypertension: Secondary | ICD-10-CM | POA: Diagnosis not present

## 2024-09-06 DIAGNOSIS — J441 Chronic obstructive pulmonary disease with (acute) exacerbation: Secondary | ICD-10-CM | POA: Diagnosis not present

## 2024-09-06 DIAGNOSIS — F17218 Nicotine dependence, cigarettes, with other nicotine-induced disorders: Secondary | ICD-10-CM | POA: Diagnosis not present

## 2024-09-06 DIAGNOSIS — R109 Unspecified abdominal pain: Secondary | ICD-10-CM | POA: Diagnosis not present

## 2024-09-06 DIAGNOSIS — R079 Chest pain, unspecified: Secondary | ICD-10-CM | POA: Diagnosis not present

## 2024-09-06 NOTE — Telephone Encounter (Signed)
 Requested medication (s) are due for refill today: Pharmacy comment: This should be every 10 days for dexcom.   Requested medication (s) are on the active medication list: yes  Last refill:  09/03/24  Future visit scheduled: yes  Notes to clinic:  Pharmacy comment: This should be every 10 days for dexcom.      Requested Prescriptions  Pending Prescriptions Disp Refills   Continuous Glucose Sensor (DEXCOM G7 SENSOR) MISC [Pharmacy Med Name: DEXCOM G7 SENSOR    MIS] 9 each 0    Sig: PLACE 1 SENSOR ONTO THE SKIN EVERY 14 DAYS     Endocrinology: Diabetes - Testing Supplies Passed - 09/06/2024  4:00 PM      Passed - Valid encounter within last 12 months    Recent Outpatient Visits           2 months ago Seizure disorder [facility])   [place] [facility] Bern[facility] Fend, DO   4 months ago Type 2 diabetes mellitus with hyperglycemia, without long-term current use of insulin  [facility])   [facility] Bern[facility] Fend, DO   7 months ago COPD exacerbation [facility])   [facility] Bernardo Fend, DO   7 months ago Type 2 diabetes mellitus with hyperglycemia, without long-term current use of insulin  [facility])   [facility] Bern[facility] Fend, DO   9 months ago Type 2 diabetes mellitus with hyperglycemia, without long-term current use of insulin  [facility])   [facility] Bern[place]end, [place]

## 2024-09-08 ENCOUNTER — Other Ambulatory Visit: Payer: Self-pay | Admitting: Internal Medicine

## 2024-09-08 DIAGNOSIS — E1165 Type 2 diabetes mellitus with hyperglycemia: Secondary | ICD-10-CM

## 2024-09-08 NOTE — Telephone Encounter (Signed)
 Copied from CRM #[id number]. Topic: Clinical - Prescription Issue >> Sep 08, 2024 11:28 AM Stephanie [name] wrote: Reason for CRM: patient lost her dexcom meter and needs another one,. She would like to know if her pcp will send it to her pharmacy?    Continuous Glucose Receiver [facility] G[facility] [place]) DEVI [[id number]]  [facility] [address] [place] - [address] Stephanie [name] [place] [address] Phone: [phone]  Fax: [phone]

## 2024-09-09 NOTE — Telephone Encounter (Signed)
 Requested medications are due for refill today.  yes  Requested medications are on the active medications list.  yes  Last refill. 05/21/2023 1  Future visit scheduled.   yes  Notes to clinic.  Reason for CRM: patient lost her dexcom meter and needs another one,. She would like to know if her pcp will send it to her pharmacy?      Continuous Glucose Receiver (DEXCOM G7 [place]) DEVI [[id number]]    Requested Prescriptions  Pending Prescriptions Disp Refills   Continuous Glucose Receiver (DEXCOM G7 RECEIVER) DEVI 1 each 0    Sig: [id number]:E11.65     Endocrinology: Diabetes - Testing Supplies Passed - 09/09/2024  1:21 PM      Passed - Valid encounter within last 12 months    Recent Outpatient Visits           2 months ago Seizure disorder [facility])   [facility] Fend, DO   4 months ago Type 2 diabetes mellitus with hyperglycemia, without long-term current use of insulin  [facility])   [facility], DO   7 months ago COPD exacerbation [facility])   [facility] Bernardo Fend, DO   7 months ago Type 2 diabetes mellitus with hyperglycemia, without long-term current use of insulin  [place] [facility])   [facility] Bernardo Fend, DO   9 months ago Type 2 diabetes mellitus with hyperglycemia, without long-term current use of insulin  [facility])   [facility] Bernardo Fend, [place]

## 2024-09-10 ENCOUNTER — Ambulatory Visit

## 2024-09-10 ENCOUNTER — Other Ambulatory Visit: Payer: Self-pay | Admitting: Internal Medicine

## 2024-09-10 ENCOUNTER — Emergency Department

## 2024-09-10 ENCOUNTER — Ambulatory Visit: Payer: Self-pay

## 2024-09-10 ENCOUNTER — Other Ambulatory Visit: Payer: Self-pay

## 2024-09-10 ENCOUNTER — Emergency Department
Admission: EM | Admit: 2024-09-10 | Discharge: 2024-09-10 | Disposition: A | Discharge status: Home / Self Care | Source: Home / Self Care | Attending: Emergency Medicine | Admitting: Emergency Medicine

## 2024-09-10 DIAGNOSIS — N200 Calculus of kidney: Secondary | ICD-10-CM | POA: Diagnosis not present

## 2024-09-10 DIAGNOSIS — R197 Diarrhea, unspecified: Secondary | ICD-10-CM | POA: Insufficient documentation

## 2024-09-10 DIAGNOSIS — I10 Essential (primary) hypertension: Secondary | ICD-10-CM | POA: Diagnosis not present

## 2024-09-10 DIAGNOSIS — R0981 Nasal congestion: Secondary | ICD-10-CM | POA: Insufficient documentation

## 2024-09-10 DIAGNOSIS — N390 Urinary tract infection, site not specified: Secondary | ICD-10-CM

## 2024-09-10 DIAGNOSIS — R10A3 Flank pain, bilateral: Secondary | ICD-10-CM | POA: Diagnosis not present

## 2024-09-10 DIAGNOSIS — E1165 Type 2 diabetes mellitus with hyperglycemia: Secondary | ICD-10-CM

## 2024-09-10 DIAGNOSIS — R10A1 Flank pain, right side: Secondary | ICD-10-CM | POA: Insufficient documentation

## 2024-09-10 DIAGNOSIS — E876 Hypokalemia: Secondary | ICD-10-CM | POA: Diagnosis not present

## 2024-09-10 DIAGNOSIS — R112 Nausea with vomiting, unspecified: Secondary | ICD-10-CM | POA: Insufficient documentation

## 2024-09-10 DIAGNOSIS — R059 Cough, unspecified: Secondary | ICD-10-CM | POA: Insufficient documentation

## 2024-09-10 LAB — COMPREHENSIVE METABOLIC PANEL WITH GFR
ALT: 6 U/L (ref 0–44)
AST: 11 U/L — ABNORMAL LOW (ref 15–41)
Albumin: 3.1 g/dL — ABNORMAL LOW (ref 3.5–5.0)
Alkaline Phosphatase: 73 U/L (ref 38–126)
Anion gap: 8 (ref 5–15)
BUN: 6 mg/dL (ref 6–20)
CO2: 23 mmol/L (ref 22–32)
Calcium: 7 mg/dL — ABNORMAL LOW (ref 8.9–10.3)
Chloride: 115 mmol/L — ABNORMAL HIGH (ref 98–111)
Creatinine, Ser: 0.38 mg/dL — ABNORMAL LOW (ref 0.44–1.00)
GFR, Estimated: >60 mL/min
Glucose, Bld: 87 mg/dL (ref 70–99)
Potassium: 2.8 mmol/L — ABNORMAL LOW (ref 3.5–5.1)
Sodium: 146 mmol/L — ABNORMAL HIGH (ref 135–145)
Total Bilirubin: 0.2 mg/dL (ref 0.0–1.2)
Total Protein: 5 g/dL — ABNORMAL LOW (ref 6.5–8.1)

## 2024-09-10 LAB — URINALYSIS, ROUTINE W REFLEX MICROSCOPIC
Bacteria, UA: NONE SEEN
Bilirubin Urine: NEGATIVE
Glucose, UA: 50 mg/dL — AB
Hgb urine dipstick: NEGATIVE
Ketones, ur: NEGATIVE mg/dL
Nitrite: NEGATIVE
Protein, ur: NEGATIVE mg/dL
Specific Gravity, Urine: 1.004 — ABNORMAL LOW (ref 1.005–1.030)
pH: 6 (ref 5.0–8.0)

## 2024-09-10 LAB — CBC WITH DIFFERENTIAL/PLATELET
Abs Immature Granulocytes: 0.02 K/uL (ref 0.00–0.07)
Basophils Absolute: 0 K/uL (ref 0.0–0.1)
Basophils Relative: 1 %
Eosinophils Absolute: 0.2 K/uL (ref 0.0–0.5)
Eosinophils Relative: 2 %
HCT: 44.5 % (ref 36.0–46.0)
Hemoglobin: 13.9 g/dL (ref 12.0–15.0)
Immature Granulocytes: 0 %
Lymphocytes Relative: 24 %
Lymphs Abs: 1.9 K/uL (ref 0.7–4.0)
MCH: 27.9 pg (ref 26.0–34.0)
MCHC: 31.2 g/dL (ref 30.0–36.0)
MCV: 89.4 fL (ref 80.0–100.0)
Monocytes Absolute: 0.7 K/uL (ref 0.1–1.0)
Monocytes Relative: 9 %
Neutro Abs: 4.9 K/uL (ref 1.7–7.7)
Neutrophils Relative %: 64 %
Platelets: 204 K/uL (ref 150–400)
RBC: 4.98 MIL/uL (ref 3.87–5.11)
RDW: 14.6 % (ref 11.5–15.5)
WBC: 7.6 K/uL (ref 4.0–10.5)
nRBC: 0 % (ref 0.0–0.2)

## 2024-09-10 LAB — RESP PANEL BY RT-PCR (RSV, FLU A&B, COVID)  RVPGX2
Influenza A by PCR: NEGATIVE
Influenza B by PCR: NEGATIVE
Resp Syncytial Virus by PCR: NEGATIVE
SARS Coronavirus 2 by RT PCR: NEGATIVE

## 2024-09-10 LAB — LIPASE, BLOOD: Lipase: 46 U/L (ref 11–51)

## 2024-09-10 MED ORDER — HYDROMORPHONE HCL 1 MG/ML IJ SOLN
0.5000 mg | Freq: Once | INTRAMUSCULAR | Status: AC
  Administered 2024-09-10: 0.5 mg via INTRAVENOUS
  Filled 2024-09-10: qty 0.5

## 2024-09-10 MED ORDER — POTASSIUM CHLORIDE CRYS ER 20 MEQ PO TBCR
40.0000 meq | EXTENDED_RELEASE_TABLET | Freq: Once | ORAL | Status: AC
  Administered 2024-09-10: 40 meq via ORAL
  Filled 2024-09-10: qty 2

## 2024-09-10 MED ORDER — METHOCARBAMOL 500 MG PO TABS: 500.0000 mg | ORAL_TABLET | Freq: Two times a day (BID) | ORAL | 0 refills | Status: AC | PRN

## 2024-09-10 MED ORDER — NITROFURANTOIN MONOHYD MACRO 100 MG PO CAPS: 100.0000 mg | ORAL_CAPSULE | Freq: Two times a day (BID) | ORAL | 0 refills | Status: AC

## 2024-09-10 MED ORDER — HYDROMORPHONE HCL 1 MG/ML IJ SOLN
1.0000 mg | Freq: Once | INTRAMUSCULAR | Status: AC
  Administered 2024-09-10: 1 mg via INTRAVENOUS
  Filled 2024-09-10: qty 1

## 2024-09-10 MED ORDER — DEXCOM G7 RECEIVER DEVI: 0 refills | Status: DC

## 2024-09-10 MED ORDER — SODIUM CHLORIDE 0.9 % IV BOLUS
1000.0000 mL | Freq: Once | INTRAVENOUS | Status: AC
  Administered 2024-09-10: 1000 mL via INTRAVENOUS

## 2024-09-10 NOTE — Telephone Encounter (Signed)
 Requested medications are due for refill today.  See note  Requested medications are on the active medications list.  yes  Last refill. 09/10/2024  Future visit scheduled.   yes  Notes to clinic.  Pharmacy comment: Please clarify the directions  for this prescription.SABRA UAD not acceptable per insurance.     Requested Prescriptions  Pending Prescriptions Disp Refills   Continuous Glucose Receiver (DEXCOM G7 RECEIVER) DEVI [Pharmacy Med Name: DEXCOM G7 RECEIVER  MIS] 1 each 0    Sig: USE AS DIRECTED     Endocrinology: Diabetes - Testing Supplies Passed - 09/10/2024  5:22 PM      Passed - Valid encounter within last 12 months    Recent Outpatient Visits           2 months ago Seizure disorder [facility])   [facility], DO   4 months ago Type 2 diabetes mellitus with hyperglycemia, without long-term current use of insulin  [facility])   [facility] Bern[facility] Fend, DO   7 months ago COPD exacerbation [facility])   [facility] Bernardo Fend, DO   7 months ago Type 2 diabetes mellitus with hyperglycemia, without long-term current use of insulin  [facility])   [facility] Bern[facility] Fend, DO   9 months ago Type 2 diabetes mellitus with hyperglycemia, without long-term current use of insulin  [facility])   [facility] Bernardo Fend, [place]

## 2024-09-10 NOTE — Telephone Encounter (Signed)
 FYI Only or Action Required?: FYI only for provider: ED advised.  Patient was last seen in primary care on 06/18/2024 by Bernardo Fend, DO.  Called Nurse Triage reporting Abdominal Pain.  Symptoms began yesterday.  Interventions attempted: Rest, hydration, or home remedies.  Symptoms are: gradually worsening.  Triage Disposition: Go to ED Now (or PCP Triage)  Patient/caregiver understands and will follow disposition?: Yes  Copied from CRM [id number]. Topic: Clinical - Red Word Triage >> Sep 10, 2024 11:34 AM Tiffini S wrote: [facility] that prompted transfer to Nurse Triage: Patient symptoms about three days ago with her appendix with stomach pain/ soreness, tenderness to area Reason for Disposition  [1] Vomiting AND [2] contains bile (green color)  Answer Assessment - Initial Assessment Questions RLQ pain starting 2 days ago- nausea and vomiting up green bile. Pain 8-9/10. She is unable to eat, stomach is bloated, Area is tender. Fever 101 yesterday. Endorses SOB when up walking around. Sudden onset but getting worse over the last few days   Advised ED for evaluation. Patient states her husband will take her in the morning after  1. LOCATION: Where does it hurt?      RLQ 2. RADIATION: Does the pain shoot anywhere else? (e.g., chest, back)     Rigth flank 3. ONSET: When did the pain begin? (e.g., minutes, hours or days ago)      2 days ago 4. SUDDEN: Gradual or sudden onset?     Sudden then worsening the last few days.  5. PATTERN Does the pain come and go, or is it constant?     constant 6. SEVERITY: How bad is the pain?  (e.g., Scale 1-10; mild, moderate, or severe)     8-9/10 7. RECURRENT SYMPTOM: Have you ever had this type of stomach pain before? If Yes, ask: When was the last time? and What happened that time?      denies 8. CAUSE: What do you think is causing the stomach pain? (e.g., gallstones, recent abdominal surgery)     Appendix? 9.  RELIEVING/AGGRAVATING FACTORS: What makes it better or worse? (e.g., antacids, bending or twisting motion, bowel movement)     denies 10. OTHER SYMPTOMS: Do you have any other symptoms? (e.g., back pain, diarrhea, fever, urination pain, vomiting)       Nausea, vomiting, SOB with activity  Protocols used: Abdominal Pain - Female-A-AH

## 2024-09-10 NOTE — ED Triage Notes (Signed)
 First nurse note: Patient c/o right quadrant pain for a couple of days. Patient has history of kidney stones and stroke. Patient was seen at her PMD recently for flu/covid.   96% Room air 100-115 pulse 151/74 bp

## 2024-09-10 NOTE — ED Triage Notes (Signed)
 BIBEMS, c/o R flank pain that radiates to R ABD. Pt reports N/V/D. GCS 15PMH: CVA 2017

## 2024-09-10 NOTE — ED Provider Notes (Signed)
 "  [facility] Provider Note    Event Date/Time   First MD Initiated Contact with Patient 09/10/24 1402     (approximate)  History   Chief Complaint: Abdominal Pain  HPI  Stephanie [name] is a 59 y.o. female with a past medical history of hypertension, kidney stones, presents to the emergency department for right flank pain.  According to the patient for the past 4 days she has been coughing with some congestion and subjective chills and has developed a right flank pain with intermittent nausea vomiting and diarrhea.  According to the patient she went to her doctor when symptoms for started had a negative COVID/flu swab.  Her main complaint today is the worsening pain in her right flank.  Has a history of kidney stones but states this feels different.  Pain is worse if she coughs.  Physical Exam   Triage Vital Signs: ED Triage Vitals  Encounter Vitals Group     BP 09/10/24 1318 (!) 140/106     Girls Systolic BP Percentile --      Girls Diastolic BP Percentile --      Boys Systolic BP Percentile --      Boys Diastolic BP Percentile --      Pulse Rate 09/10/24 1318 100     Resp 09/10/24 1318 16     Temp 09/10/24 1318 97.8 F (36.6 C)     Temp Source 09/10/24 1318 Oral     SpO2 09/10/24 1318 100 %     Weight 09/10/24 1318 181 lb (82.1 kg)     Height 09/10/24 1318 5' 2 (1.575 m)     Head Circumference --      Peak Flow --      Pain Score 09/10/24 1324 9     Pain Loc --      Pain Education --      Exclude from Growth Chart --     Most recent vital signs: Vitals:   09/10/24 1318  BP: (!) 140/106  Pulse: 100  Resp: 16  Temp: 97.8 F (36.6 C)  SpO2: 100%    General: Awake, no distress.  CV:  Good peripheral perfusion.  Regular rate and rhythm  Resp:  Normal effort.  Equal breath sounds bilaterally.  Abd:  No distention.  Soft, mild right flank tenderness to palpation along the right upper and lower areas of the abdomen.  No rebound or  guarding.  ED Results / Procedures / Treatments   RADIOLOGY  CT pending   MEDICATIONS ORDERED IN ED: Medications  sodium chloride  0.9 % bolus 1,000 mL (has no administration in time range)  HYDROmorphone  (DILAUDID ) injection 0.5 mg (has no administration in time range)     IMPRESSION / MDM / ASSESSMENT AND PLAN / ED COURSE  I reviewed the triage vital signs and the nursing notes.  Patient's presentation is most consistent with acute presentation with potential threat to life or bodily function.  Patient presents emergency department for right flank pain.  Also states for last 3 to 4 days she has been coughing congested with nausea but did have an episode of vomiting as well as several episodes of diarrhea.  We will check labs including a urinalysis we will obtain a CT renal scan we will dose fluids and pain medication.  Given the patient's symptoms we will also obtain a COVID/flu swab.  Patient agreeable to plan of care.  Patient's lab work today shows a reassuring CBC.  Patient's chemistry  has resulted showing hypokalemia around 2.8 as well as some mild hypocalcemia.  Patient receiving IV fluids.  Urinalysis lipase and CT renal scan pending.  FINAL CLINICAL IMPRESSION(S) / ED DIAGNOSES   Right flank pain   Note:  This document was prepared using Dragon voice recognition software and may include unintentional dictation errors.   Dorothyann Drivers, MD 09/10/24 1539  "

## 2024-09-10 NOTE — ED Provider Notes (Signed)
" °  Physical Exam  BP (!) 165/91   Pulse 100   Temp 97.8 F (36.6 C) (Oral)   Resp 16   Ht 5' 1 (1.549 m)   Wt 82.1 kg   SpO2 100%   BMI 34.20 kg/m   Physical Exam  Procedures  Procedures  ED Course / MDM    Medical Decision Making Amount and/or Complexity of Data Reviewed Radiology: ordered.  Risk Prescription drug management.   This patient's care was signed out to me at shift change pending completion of workup including urinalysis and CT imaging.  CT imaging does not show any acute intra-abdominal pathology.  There are multiple renal stones that are nonobstructing.  Urinalysis shows leukocyte esterase and white blood cells though no bacteria.  Given the patient's symptoms decision was made to treat her with Macrobid  100 mg twice daily x 5 days.  I also discussed that her symptoms may be related to muscle spasm/strain secondary to her coughing.  Discussed that I will prescribe her Robaxin  for her symptoms but that she should be careful taking this medication as it can make you extremely drowsy.  She will not drive or operate any heavy machinery when taking this medication.  She will follow-up with her primary care physician within the next few days.  Return to the emergency department for new or worsening symptoms.         Rexford Reche HERO, MD 09/10/24 1659  "

## 2024-09-21 ENCOUNTER — Ambulatory Visit (INDEPENDENT_AMBULATORY_CARE_PROVIDER_SITE_OTHER): Admitting: Internal Medicine

## 2024-09-21 ENCOUNTER — Encounter: Payer: Self-pay | Admitting: Internal Medicine

## 2024-09-21 VITALS — BP 130/82 | HR 100 | Temp 97.9°F | Resp 16 | Ht 62.0 in | Wt 183.9 lb

## 2024-09-21 DIAGNOSIS — Z7984 Long term (current) use of oral hypoglycemic drugs: Secondary | ICD-10-CM

## 2024-09-21 DIAGNOSIS — I10 Essential (primary) hypertension: Secondary | ICD-10-CM

## 2024-09-21 DIAGNOSIS — E1165 Type 2 diabetes mellitus with hyperglycemia: Secondary | ICD-10-CM

## 2024-09-21 DIAGNOSIS — R051 Acute cough: Secondary | ICD-10-CM

## 2024-09-21 DIAGNOSIS — J439 Emphysema, unspecified: Secondary | ICD-10-CM

## 2024-09-21 DIAGNOSIS — Z7985 Long-term (current) use of injectable non-insulin antidiabetic drugs: Secondary | ICD-10-CM

## 2024-09-21 LAB — POCT GLYCOSYLATED HEMOGLOBIN (HGB A1C): Hemoglobin A1C: 6 % — AB (ref 4.0–5.6)

## 2024-09-21 MED ORDER — AMLODIPINE BESYLATE 2.5 MG PO TABS: 2.5000 mg | ORAL_TABLET | Freq: Every day | ORAL | 1 refills | Status: AC

## 2024-09-21 MED ORDER — METFORMIN HCL ER 500 MG PO TB24: 500.0000 mg | ORAL_TABLET | Freq: Two times a day (BID) | ORAL | 1 refills | Status: AC

## 2024-09-21 MED ORDER — LISINOPRIL 10 MG PO TABS: 10.0000 mg | ORAL_TABLET | Freq: Every day | ORAL | 1 refills | Status: AC

## 2024-09-21 MED ORDER — BENZONATATE 100 MG PO CAPS: 100.0000 mg | ORAL_CAPSULE | Freq: Two times a day (BID) | ORAL | 0 refills | Status: AC | PRN

## 2024-09-21 NOTE — Progress Notes (Signed)
 "  Established Patient Office Visit  Subjective   [name]: Stephanie [name], female    DOB: 1965/11/15  Age: 59 y.o. MRN: [id number]  Chief Complaint  Patient presents with   Medical Management of Chronic Issues    3 Month recheck    Patient is here for follow up on chronic medical condition and recent ER visit.   Discussed the use of AI scribe software for clinical note transcription with the patient, who gave verbal consent to proceed.  History of Present Illness   Nashley B [name] is a 59 year old female with COPD who presents with a persistent cough.  She has had a persistent cough for 2 weeks, severe enough to trigger vomiting. She has not used cough medicine. Mucinex application worsened her symptoms. She denies fever.  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 She uses Advair and albuterol  inhalers. Albuterol  gives some rescue relief. She is short of breath with walking short distances, which she attributes to COPD. Trelegy was denied by insurance.  Her diabetes is well controlled with a recent A1c of 6.0. She is on Ozempic  2 mg, lisinopril , amlodipine , and metformin  without reported gastrointestinal issues.  Her father is hospitalized with flu and pneumonia, but she has not had close contact. She was tested for flu and COVID-19 earlier this month.  Hypertension: -Medications: Lisinopril  10 mg, Amlodipine  2.5 mg. No longer taking Lasix , swelling resolved.  -Patient is compliant with above medications and reports no side effects. -Denies any SOB, CP, vision changes, LE edema or symptoms of hypotension -Diet: eating less calories  -Exercise: recently working out at [employer], lost 11 pounds  Diabetes, Type 2: -Last A1c 7.1% 10/25 -Medications: Ozempic  2 mg, Metformin  500 mg XR BID -Patient is compliant with the above medications and reports no side effects. -Checking BG at home: 107 currently - using Dexcom -Eye exam: UTD -Foot exam: Due today -Microalbumin: Due  today -Statin: yes -PNA vaccine: UTD -Denies symptoms of hypoglycemia, polyuria, polydipsia, numbness extremities, foot ulcers/trauma.  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   Bipolar disorder (HCC) 05/09/2016   Hyperlipidemia, unspecified 05/09/2016   Irritable bowel syndrome 05/09/2016   Lumbago with sciatica 05/09/2016   Esophageal reflux 05/09/2016   Scoliosis 05/09/2016   Diverticulosis of colon 05/09/2016   DDD (degenerative disc disease), cervical 05/09/2016   ICH (intracerebral hemorrhage) (HCC)    Primary hypertension 04/19/2016   Cytotoxic brain edema (HCC) 04/19/2016   IVH (intraventricular hemorrhage) (HCC) 04/18/2016   Bilateral carpal tunnel syndrome 04/12/2016   Falls frequently 01/12/2016   Trigeminal neuralgia 06/08/2015   Sacroiliac dysfunction 03/04/2014   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 or radiculopathy 05/20/2013   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   Tobacco use disorder 09/11/2012   History of cervical cancer 07/30/2012   Hepatitis C    Past Medical History:  Diagnosis Date   Closed fracture of shaft of humerus 01/20/2017   Diabetes mellitus without complication (HCC)    Difficult intubation    Diverticulitis    Headache  Hepatitis C    treated and resolved   History of kidney stones    Hypertension    Hypotension 08/22/2018   IBS (irritable bowel syndrome)    Kidney stones    Sciatica    Sleep apnea    on cpap   Stroke (HCC) 2017   memory  loss   Superficial venous thrombosis of arm, left 07/22/2016   Past Surgical History:  Procedure Laterality Date   ABDOMINAL HYSTERECTOMY  2007   carpel tunnel syndrome  2017   HARDWARE REMOVAL Right 12/22/2017   Procedure: HARDWARE REMOVAL-RIGHT ARM;  Surgeon: Leora Lynwood SAUNDERS, MD;  Location: [facility] ORS;  Service: Orthopedics;  Laterality: Right;  Removal of implant, superficial right humerus   HUMERUS IM NAIL Right 03/11/2017   Procedure: INTRAMEDULLARY (IM) NAIL HUMERAL;  Surgeon: Leora Lynwood SAUNDERS, MD;  Location: [facility] ORS;  Service: Orthopedics;  Laterality: Right;   KIDNEY STONE SURGERY Right 02/12/2012   KIDNEY STONE SURGERY Left 07/15/2012   LIGATION OF ARTERIOVENOUS  FISTULA Left 06/21/2016   Procedure: LIGATION OF ARTERIOVENOUS  FISTULA ( LIGATION BASILIC VEIN );  Surgeon: Cordella KANDICE Shawl, MD;  Location: [facility] ORS;  Service: Vascular;  Laterality: Left;   LITHOTRIPSY     OOPHORECTOMY     SINUS EXPLORATION     TONSILLECTOMY     Social History   Tobacco Use   Smoking status: Every Day    Current packs/day: 1.00    Average packs/day: 1 pack/day for 1.5 years (1.5 ttl pk-yrs)    Types: Cigarettes   Smokeless tobacco: Never  Vaping Use   Vaping status: Never Used  Substance Use Topics   Alcohol use: Not Currently    Comment: no alcohol since 2002   Drug use: No   Social History   Socioeconomic History   Marital status: Legally Separated    Spouse name: Not on file   Number of children: Not on file   Years of education: Not on file   Highest education level: Not on file  Occupational History   Not on file  Tobacco Use   Smoking status: Every Day    Current packs/day: 1.00    Average packs/day: 1 pack/day for 1.5 years (1.5 ttl pk-yrs)    Types: Cigarettes   Smokeless tobacco: Never  Vaping Use   Vaping status: Never Used  Substance and Sexual Activity   Alcohol use: Not Currently    Comment: no alcohol since 2002   Drug use: No   Sexual activity: Not on file   Other Topics Concern   Not on file  Social History Narrative   Not on file   Social Drivers of Health   Tobacco Use: High Risk (09/21/2024)   Patient History    Smoking Tobacco Use: Every Day    Smokeless Tobacco Use: Never    Passive Exposure: Not on file  Financial Resource Strain: Low Risk  (02/05/2024)   [facility]   Overall Financial Resource Strain (CARDIA)    Difficulty of Paying Living Expenses: Not very hard  Food Insecurity: No Food Insecurity (02/05/2024)   [facility]   Epic    Within the past 12 months, you worried that your food would run out before you got the money to buy more.: Never true    Within the past 12 months, the food you bought just didn't last and you didn't have money to get more.: Never true  Transportation Needs: No  Transportation Needs (02/17/2024)   PRAPARE - Administrator, Civil Service (Medical): No    Lack of Transportation (Non-Medical): No  Recent Concern: Transportation Needs - Unmet Transportation Needs (02/05/2024)   [facility] from [facility] - Transportation    In the past 12 months, has lack of transportation kept you from medical appointments or from getting medications?: Yes    Lack of Transportation (Non-Medical): No  Physical Activity: Not on file  Stress: Not on file  Social Connections: Not on file  Intimate Partner Violence: Not At Risk (12/31/2023)   Humiliation, Afraid, Rape, and Kick questionnaire    Fear of Current or Ex-Partner: No    Emotionally Abused: No    Physically Abused: No    Sexually Abused: No  Depression (PHQ2-9): Low Risk (08/01/2023)   Depression (PHQ2-9)    PHQ-2 Score: 1  Recent Concern: Depression (PHQ2-9) - Medium Risk (05/19/2023)   Depression (PHQ2-9)    PHQ-2 Score: 10  Alcohol Screen: Low Risk (06/11/2023)   Alcohol Screen    Last Alcohol Screening Score (AUDIT): 0  Housing: Low Risk  (02/05/2024)    Received from [facility]   Epic    In the last 12 months, was there a time when you were not able to pay the mortgage or rent on time?: No    In the past 12 months, how many times have you moved where you were living?: 0    At any time in the past 12 months, were you homeless or living in a shelter (including now)?: No  Utilities: Not At Risk (02/05/2024)   Received from [facility] Utilities    Threatened with loss of utilities: No  Health Literacy: Not on file   Family Status  Relation Name Status   Mother  Deceased   Father  Alive   Sister  Deceased  No partnership data on file   Family History  Problem Relation Age of Onset   Heart failure Mother    Diabetes Father    Cancer Sister    Allergies  Allergen Reactions   Gabapentin  Itching   Ibuprofen  Itching   Sulfa  Antibiotics Hives   Tylenol  [Acetaminophen ] Itching   Aspirin Itching   Buprenorphine Hcl Itching   Carbamazepine Itching, Other (See Comments) and Rash    Makes her feel like something is crawling under her skin.   Compazine  Itching and Other (See Comments)    makes my skin crawl   Elemental Sulfur  Hives, Rash and Other (See Comments)    Skin Rashes, Hives   Metrizamide Itching   Naproxen  Itching   Penicillin G Hives, Rash and Other (See Comments)    Has patient had a PCN reaction causing immediate rash, facial/tongue/throat swelling, SOB or lightheadedness with hypotension: Yes Has patient had a PCN reaction causing severe rash involving mucus membranes or skin necrosis: No Has patient had a PCN reaction that required hospitalization: No Has patient had a PCN reaction occurring within the last 10 years: No If all of the above answers are NO, then may proceed with Cephalosporin use.    Reglan  [Metoclopramide ] Itching and Other (See Comments)    Makes her skin crawl   Toradol  [Ketorolac  Tromethamine ] Itching   Tramadol  Itching   Zofran  [Ondansetron  Hcl]  Itching      Review of Systems  Constitutional:  Negative for chills and fever.  Respiratory:  Positive for cough and shortness of  breath. Negative for wheezing.   All other systems reviewed and are negative.     Objective:     BP 130/82 (Cuff Size: Large)   Pulse 100   Temp 97.9 F (36.6 C) (Oral)   Resp 16   Ht 5' 2 (1.575 m)   Wt 183 lb 14.4 oz (83.4 kg)   SpO2 95%   BMI 33.64 kg/m  BP Readings from Last 3 Encounters:  09/21/24 130/82  09/10/24 (!) 152/74  08/07/24 (!) 163/86   Wt Readings from Last 3 Encounters:  09/21/24 183 lb 14.4 oz (83.4 kg)  09/10/24 181 lb (82.1 kg)  08/07/24 174 lb (78.9 kg)      Physical Exam Constitutional:      Appearance: Normal appearance.  HENT:     Head: Normocephalic and atraumatic.  Eyes:     Conjunctiva/sclera: Conjunctivae normal.  Cardiovascular:     Rate and Rhythm: Normal rate and regular rhythm.  Pulmonary:     Effort: Pulmonary effort is normal.     Breath sounds: Normal breath sounds.  Skin:    General: Skin is warm and dry.  Neurological:     General: No focal deficit present.     Mental Status: She is alert. Mental status is at baseline.  Psychiatric:        Mood and Affect: Mood normal.        Behavior: Behavior normal.      Results for orders placed or performed in visit on 09/21/24  POCT HgB A1C  Result Value Ref Range   Hemoglobin A1C 6.0 (A) 4.0 - 5.6 %   HbA1c POC (<> result, manual entry)     HbA1c, POC (prediabetic range)     HbA1c, POC (controlled diabetic range)       Last CBC Lab Results  Component Value Date   WBC 7.6 09/10/2024   HGB 13.9 09/10/2024   HCT 44.5 09/10/2024   MCV 89.4 09/10/2024   MCH 27.9 09/10/2024   RDW 14.6 09/10/2024   PLT 204 09/10/2024   Last metabolic panel Lab Results  Component Value Date   GLUCOSE 87 09/10/2024   NA 146 (H) 09/10/2024   K 2.8 (L) 09/10/2024   CL 115 (H) 09/10/2024   CO2 23 09/10/2024   BUN 6 09/10/2024   CREATININE 0.38 (L)  09/10/2024   GFRNONAA >60 09/10/2024   CALCIUM  7.0 (L) 09/10/2024   PHOS 2.3 (L) 08/22/2018   PROT 5.0 (L) 09/10/2024   ALBUMIN 3.1 (L) 09/10/2024   BILITOT 0.2 09/10/2024   ALKPHOS 73 09/10/2024   AST 11 (L) 09/10/2024   ALT 6 09/10/2024   ANIONGAP 8 09/10/2024   Last lipids Lab Results  Component Value Date   CHOL 123 02/13/2022   HDL 73 02/13/2022   LDLCALC 35 02/13/2022   TRIG 69 02/13/2022   CHOLHDL 1.7 02/13/2022   Last hemoglobin A1c Lab Results  Component Value Date   HGBA1C 6.0 (A) 09/21/2024   Last thyroid  functions Lab Results  Component Value Date   TSH 1.218 04/16/2022   Last vitamin D  No results found for: MARIEN BOLLS, VD25OH Last vitamin B12 and Folate Lab Results  Component Value Date   VITAMINB12 380 02/20/2022      The ASCVD Risk score (Arnett DK, et al., 2019) failed to calculate for the following reasons:   Risk score cannot be calculated because patient has a medical history suggesting prior/existing ASCVD   * - Cholesterol units were assumed  Assessment & Plan:   Assessment & Plan  Acute cough Persistent cough for two weeks, severe enough to induce vomiting. Possible post-viral cough. - Prescribed cough suppressant. - Advised to report if cough worsens or if fever develops.  Chronic obstructive pulmonary disease COPD stage three. Albuterol  most effective. Insurance issues prevent use of Trelegy. Shortness of breath on exertion likely due to COPD. - Continue Advair and albuterol . - Discuss with pulmonologist about potential inhaler changes, including Trelegy and Brezdry. - Follow up with pulmonologist on the 2nd of next month.  Type 2 diabetes mellitus w/Hyperglycemia  Well-controlled with an A1c of 6.0. - Continue current diabetes medications. - Performed diabetic foot exam. - Conducted urine test to check for diabetic kidney disease.  Primary hypertension Amlodipine  prescribed. Lisinopril  refill due. -  Refilled lisinopril  and amlodipine  prescriptions.  General health maintenance Flu vaccination received.  - POCT HgB A1C - metFORMIN  (GLUCOPHAGE -XR) 500 MG 24 hr tablet; Take 1 tablet (500 mg total) by mouth 2 (two) times daily with a meal.  Dispense: 180 tablet; Refill: 1 - Urine Microalbumin w/creat. ratio - HM Diabetes Foot Exam - lisinopril  (ZESTRIL ) 10 MG tablet; Take 1 tablet (10 mg total) by mouth daily.  Dispense: 90 tablet; Refill: 1 - amLODipine  (NORVASC ) 2.5 MG tablet; Take 1 tablet (2.5 mg total) by mouth daily.  Dispense: 90 tablet; Refill: 1 - benzonatate  (TESSALON ) 100 MG capsule; Take 1 capsule (100 mg total) by mouth 2 (two) times daily as needed for cough.  Dispense: 20 capsule; Refill: 0   Return in about 6 months (around 03/21/2025).    Sharyle Fischer, DO "

## 2024-09-22 ENCOUNTER — Ambulatory Visit: Payer: Self-pay | Admitting: Internal Medicine

## 2024-09-22 LAB — MICROALBUMIN / CREATININE URINE RATIO
Creatinine, Urine: 25 mg/dL (ref 20–275)
Microalb Creat Ratio: 20 mg/g{creat}
Microalb, Ur: 0.5 mg/dL

## 2024-10-05 ENCOUNTER — Other Ambulatory Visit: Payer: Self-pay | Admitting: Internal Medicine

## 2024-10-05 DIAGNOSIS — G40909 Epilepsy, unspecified, not intractable, without status epilepticus: Secondary | ICD-10-CM

## 2024-10-06 ENCOUNTER — Other Ambulatory Visit: Payer: Self-pay | Admitting: Internal Medicine

## 2024-10-06 DIAGNOSIS — E1165 Type 2 diabetes mellitus with hyperglycemia: Secondary | ICD-10-CM

## 2024-10-06 NOTE — Telephone Encounter (Signed)
 Requested Prescriptions  Pending Prescriptions Disp Refills   lamoTRIgine  (LAMICTAL ) 100 MG tablet [Pharmacy Med Name: lamoTRIgine  100 MG Oral Tablet] 180 tablet 0    Sig: Take 1 tablet by mouth twice daily     Neurology:  Anticonvulsants - lamotrigine  Failed - 10/06/2024  8:59 AM      Failed - Cr in normal range and within 360 days    Creat  Date Value Ref Range Status  05/19/2023 0.73 0.50 - 1.03 mg/dL Final   Creatinine, Ser  Date Value Ref Range Status  09/10/2024 0.38 (L) 0.44 - 1.00 mg/dL Final   Creatinine, Urine  Date Value Ref Range Status  09/21/2024 25 20 - 275 mg/dL Final         Failed - AST in normal range and within 360 days    AST  Date Value Ref Range Status  09/10/2024 11 (L) 15 - 41 U/L Final   SGOT(AST)  Date Value Ref Range Status  03/30/2014 23 15 - 37 Unit/L Final         Failed - Completed PHQ-2 or PHQ-9 in the last 360 days      Passed - ALT in normal range and within 360 days    ALT  Date Value Ref Range Status  09/10/2024 6 0 - 44 U/L Final   SGPT (ALT)  Date Value Ref Range Status  03/30/2014 29 U/L Final    Comment:    14-63 NOTE: New Reference Range 03/29/14          Passed - Valid encounter within last 12 months    Recent Outpatient Visits           2 weeks ago Type 2 diabetes mellitus with hyperglycemia, without long-term current use of insulin  [facility])   [facility] Bernardo Fend, DO   3 months ago Seizure disorder [facility])   [facility], [facility]   5 months ago Type 2 diabetes mellitus with hyperglycemia, without long-term current use of insulin  [facility])   [facility] Bernardo Fend, DO   8 months ago COPD exacerbation [facility])   [facility] Bernardo Fend, DO   8 months ago Type 2 diabetes mellitus with hyperglycemia, without long-term current use of insulin  [facility])   [facility] Bernardo Fend, [place]

## 2024-10-07 NOTE — Telephone Encounter (Signed)
 Requested Prescriptions  Refused Prescriptions Disp Refills   OZEMPIC , 2 MG/DOSE, 8 MG/3ML SOPN [Pharmacy Med Name: Ozempic  (2 MG/DOSE) 8 MG/3ML Subcutaneous Solution Pen-injector] 3 mL 0    Sig: INJECT 2 MG SUBCUTANEOUSLY ONCE A WEEK     Endocrinology:  Diabetes - GLP-1 Receptor Agonists - semaglutide  Failed - 10/07/2024 12:18 PM      Failed - HBA1C in normal range and within 180 days    Hemoglobin A1C  Date Value Ref Range Status  09/21/2024 6.0 (A) 4.0 - 5.6 % Final   Hgb A1c MFr Bld  Date Value Ref Range Status  04/15/2023 7.2 (H) <5.7 % of total Hgb Final    Comment:    For someone without known diabetes, a hemoglobin A1c value of 6.5% or greater indicates that they may have  diabetes and this should be confirmed with a follow-up  test. . For someone with known diabetes, a value <7% indicates  that their diabetes is well controlled and a value  greater than or equal to 7% indicates suboptimal  control. A1c targets should be individualized based on  duration of diabetes, age, comorbid conditions, and  other considerations. . Currently, no consensus exists regarding use of hemoglobin A1c for diagnosis of diabetes for children. .          Failed - Cr in normal range and within 360 days    Creat  Date Value Ref Range Status  05/19/2023 0.73 0.50 - 1.03 mg/dL Final   Creatinine, Ser  Date Value Ref Range Status  09/10/2024 0.38 (L) 0.44 - 1.00 mg/dL Final   Creatinine, Urine  Date Value Ref Range Status  09/21/2024 25 20 - 275 mg/dL Final         Passed - Valid encounter within last 6 months    Recent Outpatient Visits           2 weeks ago Type 2 diabetes mellitus with hyperglycemia, without long-term current use of insulin  [facility])   [facility] Fend, DO   3 months ago Seizure disorder [facility])   [facility] Bernardo Fend, DO   5 months ago Type 2 diabetes mellitus with hyperglycemia, without  long-term current use of insulin  [facility])   [facility] Bernardo Fend, DO   8 months ago COPD exacerbation [facility])   [facility] Bernardo Fend, DO   8 months ago Type 2 diabetes mellitus with hyperglycemia, without long-term current use of insulin  [facility])   [facility] Bernardo Fend, DO               Continuous Glucose Sensor (DEXCOM G7 SENSOR) MISC [Pharmacy Med Name: Dexcom G7 Sensor Miscellaneous] 9 each 0    Sig: USE AS DIRECTED     Endocrinology: Diabetes - Testing Supplies Passed - 10/07/2024 12:18 PM      Passed - Valid encounter within last 12 months    Recent Outpatient Visits           2 weeks ago Type 2 diabetes mellitus with hyperglycemia, without long-term current use of insulin  [facility])   [facility] Bernardo Fend, DO   3 months ago Seizure disorder [facility])   [facility] Bern[facility] Fend, DO   5 months ago Type 2 diabetes mellitus with hyperglycemia, without long-term current use of insulin  [facility])   [facility] Bernardo Fend, DO   8 months  ago COPD exacerbation [facility])   [facility] Bernardo Fend, DO   8 months ago Type 2 diabetes mellitus with hyperglycemia, without long-term current use of insulin  [facility])   [facility] Bernardo Fend, [place]

## 2024-10-08 ENCOUNTER — Other Ambulatory Visit: Payer: Self-pay | Admitting: Internal Medicine

## 2024-10-08 DIAGNOSIS — E1165 Type 2 diabetes mellitus with hyperglycemia: Secondary | ICD-10-CM

## 2024-10-08 NOTE — Telephone Encounter (Signed)
 Copied from CRM #[id number]. Topic: Clinical - Medication Refill >> Oct 08, 2024 11:44 AM Rosaria E wrote: Medication: Pt just needs a replacement Dexcom Meter, her dog destroyed her current supply. Does not need the kit, just the meter.   diazepam  (VALIUM ) 10 MG tablet  Semaglutide , 2 MG/DOSE, (OZEMPIC , 2 MG/DOSE,) 8 MG/3ML SOPN  Has the patient contacted their pharmacy? Yes (Agent: If no, request that the patient contact the pharmacy for the refill. If patient does not wish to contact the pharmacy document the reason why and proceed with request.) (Agent: If yes, when and what did the pharmacy advise?)  This is the patient's preferred pharmacy:  [facility] [address] [place] - [address] [place] [address] Phone: [phone] Fax: [phone]  Is this the correct pharmacy for this prescription? Yes If no, delete pharmacy and type the correct one.   Has the prescription been filled recently? Yes  Is the patient out of the medication? Yes  Has the patient been seen for an appointment in the last year OR does the patient have an upcoming appointment? Yes  Can we respond through MyChart? Yes  Agent: Please be advised that Rx refills may take up to 3 business days. We ask that you follow-up with your pharmacy.

## 2024-10-08 NOTE — Telephone Encounter (Signed)
 Pt just needs a replacement Dexcom Meter, her dog destroyed her current supply. Does not need the kit, just the meter.

## 2024-10-11 ENCOUNTER — Ambulatory Visit: Payer: Self-pay

## 2024-10-11 MED ORDER — OZEMPIC (2 MG/DOSE) 8 MG/3ML ~~LOC~~ SOPN: 2.0000 mg | PEN_INJECTOR | SUBCUTANEOUS | 1 refills | Status: AC

## 2024-10-11 MED ORDER — BLOOD GLUCOSE MONITOR KIT: PACK | 0 refills | Status: AC

## 2024-10-11 MED ORDER — DEXCOM G7 RECEIVER DEVI: 1.0000 | 0 refills | Status: AC

## 2024-10-11 NOTE — Telephone Encounter (Signed)
 FYI Only or Action Required?: Action required by provider: clinical question for provider and ED advised.  Patient was last seen in primary care on 09/21/2024 by Bernardo Fend, DO.  Called Nurse Triage reporting Fall, Dizziness, and Blood Sugar Problem.  Symptoms began today.  Interventions attempted: Nothing.  Symptoms are: stable.  Triage Disposition: Go to ED Now (or PCP Triage)  Patient/caregiver understands and will follow disposition?: Yes                                  Symptoms: 2 falls today (frequent falls are not normal for patient), 1st fall resulted in injury to knee and elbow, episodes of dizziness occurred before both falls, frequent headaches at baseline Denies: hitting head, LOC Patient takes Plavix     Patient called to report her dog chewed up her glucometer and she has been without it for a week. Patient stated the provider specifically needs to let the insurance company know that she needs a new device to check her blood glucose 2-3 times per day. Patient is unsure of blood glucose at this current time. This RN advised ED. Patient verbalized understanding and agreed to go. Please advise on glucometer issue.   Reason for Disposition  Patient sounds very sick or weak to the triager  Protocols used: Falls and Falling-A-AH  Summary: sugar+ / fell 2x / dizzy   Reason for Triage: patient said she checked her sugar around 8am and it was 260 and she has been dizzy and lightheaded since. She fell on her knees/elbow inside and outside today.

## 2024-10-11 NOTE — Telephone Encounter (Signed)
 Requested medication (s) are due for refill today: Yes  Requested medication (s) are on the active medication list: Yes  Last refill:  04/17/22  Future visit scheduled: Yes  Notes to clinic:  Unable to refill per protocol, cannot delegate, last refill by another provider.      Requested Prescriptions  Pending Prescriptions Disp Refills   diazepam  (VALIUM ) 10 MG tablet 30 tablet     Sig: Take 1 tablet (10 mg total) by mouth 3 (three) times daily.     Not Delegated - Psychiatry: Anxiolytics/Hypnotics 2 Failed - 10/11/2024  1:24 PM      Failed - This refill cannot be delegated      Failed - Urine Drug Screen completed in last 360 days      Passed - Patient is not pregnant      Passed - Valid encounter within last 6 months    Recent Outpatient Visits           2 weeks ago Type 2 diabetes mellitus with hyperglycemia, without long-term current use of insulin  [facility])   [place] [facility] Bernardo Fend, DO   3 months ago Seizure disorder [facility])   [facility]ardo Fend, DO   5 months ago Type 2 diabetes mellitus with hyperglycemia, without long-term current use of insulin  [facility])   [facility] Bernardo Fend, DO   8 months ago COPD exacerbation [facility])   [facility] Bernardo Fend, DO   8 months ago Type 2 diabetes mellitus with hyperglycemia, without long-term current use of insulin  [facility])   [place] [facility] [place]end, [place]              Signed Prescriptions Disp Refills   Continuous Glucose Receiver (DEXCOM G7 RECEIVER) DEVI 1 each 0    Sig: Inject 1 each into the skin See admin instructions.     Endocrinology: Diabetes - Testing Supplies Passed - 10/11/2024  1:24 PM      Passed - Valid encounter within last 12 months    Recent Outpatient Visits           2 weeks ago Type 2 diabetes mellitus with hyperglycemia, without long-term  current use of insulin  [facility])   [facility] Fend, DO   3 months ago Seizure disorder [facility])   [facility]ardo Fend, DO   5 months ago Type 2 diabetes mellitus with hyperglycemia, without long-term current use of insulin  [facility])   [place] Fend, DO   8 months ago COPD exacerbation [facility])   [place] Bernardo Fend, DO   8 months ago Type 2 diabetes mellitus with hyperglycemia, without long-term current use of insulin  [facility])   [place] [facility] Bernardo Fend, DO               Semaglutide , 2 MG/DOSE, (OZEMPIC , 2 MG/DOSE,) 8 MG/3ML SOPN 6 mL 1    Sig: Inject 2 mg into the skin once a week.     Endocrinology:  Diabetes - GLP-1 Receptor Agonists - semaglutide  Failed - 10/11/2024  1:24 PM      Failed - HBA1C in normal range and within 180 days    Hemoglobin A1C  Date Value Ref Range Status  09/21/2024 6.0 (A) 4.0 - 5.6 % Final   Hgb A1c MFr Bld  Date Value Ref Range Status  04/15/2023 7.2 (H) <  5.7 % of total Hgb Final    Comment:    For someone without known diabetes, a hemoglobin A1c value of 6.5% or greater indicates that they may have  diabetes and this should be confirmed with a follow-up  test. . For someone with known diabetes, a value <7% indicates  that their diabetes is well controlled and a value  greater than or equal to 7% indicates suboptimal  control. A1c targets should be individualized based on  duration of diabetes, age, comorbid conditions, and  other considerations. . Currently, no consensus exists regarding use of hemoglobin A1c for diagnosis of diabetes for children. .          Failed - Cr in normal range and within 360 days    Creat  Date Value Ref Range Status  05/19/2023 0.73 0.50 - 1.03 mg/dL Final   Creatinine, Ser  Date Value Ref Range Status  09/10/2024 0.38 (L) 0.44 -  1.00 mg/dL Final   Creatinine, Urine  Date Value Ref Range Status  09/21/2024 25 20 - 275 mg/dL Final         Passed - Valid encounter within last 6 months    Recent Outpatient Visits           2 weeks ago Type 2 diabetes mellitus with hyperglycemia, without long-term current use of insulin  [facility])   [facility] Bernardo Fend, DO   3 months ago Seizure disorder [facility])   [facility] Bern[facility] Fend, DO   5 months ago Type 2 diabetes mellitus with hyperglycemia, without long-term current use of insulin  [facility])   [facility] Bernardo Fend, DO   8 months ago COPD exacerbation [facility])   [facility] Bernardo Fend, DO   8 months ago Type 2 diabetes mellitus with hyperglycemia, without long-term current use of insulin  [facility])   [facility] Bernardo Fend, [place]

## 2024-10-13 ENCOUNTER — Other Ambulatory Visit: Payer: Self-pay

## 2024-10-13 NOTE — Patient Outreach (Signed)
 Complex Care Management   Visit Note  10/13/2024  Name:  Stephanie [name] MRN: [id number] DOB: 06-05-1966  Situation: Referral received for Complex Care Management related to DMII. I obtained verbal consent from Patient.  Visit completed with Patient  on the phone  Background:   Past Medical History:  Diagnosis Date   Closed fracture of shaft of humerus 01/20/2017   Diabetes mellitus without complication (HCC)    Difficult intubation    Diverticulitis    Headache    Hepatitis C    treated and resolved   History of kidney stones    Hypertension    Hypotension 08/22/2018   IBS (irritable bowel syndrome)    Kidney stones    Sciatica    Sleep apnea    on cpap   Stroke [facility] 2017   memory loss   Superficial venous thrombosis of arm, left 07/22/2016    Assessment: Patient Reported Symptoms:  Cognitive Cognitive Status: No symptoms reported, Normal speech and language skills, Alert and oriented to person, place, and time Cognitive/Intellectual Conditions Management [RPT]: None reported or documented in medical history or problem list   Health Maintenance Behaviors: Annual physical exam, Sleep adequate Healing Pattern: Unsure Health Facilitated by: Healthy diet, Rest, Stress management  Neurological Neurological Review of Symptoms: No symptoms reported Neurological Management Strategies: Adequate rest, Routine screening, Coping strategies Neurological Self-Management Outcome: 4 (good)  HEENT HEENT Symptoms Reported: No symptoms reported HEENT Management Strategies: Routine screening HEENT Self-Management Outcome: 4 (good) HEENT Comment: hx GERD    Cardiovascular Cardiovascular Symptoms Reported: No symptoms reported Does patient have uncontrolled Hypertension?: No Cardiovascular Management Strategies: Medication therapy, Routine screening Cardiovascular Self-Management Outcome: 4 (good)  Respiratory Respiratory Symptoms Reported: No symptoms reported Other Respiratory  Symptoms: Has CPAP Additional Respiratory Details: Reports SOB with exertion Respiratory Management Strategies: Adequate rest, Routine screening  Endocrine Endocrine Symptoms Reported: No symptoms reported Is patient diabetic?: Yes Is patient checking blood sugars at home?: No (BS average 120-140. Waiting on approval for the Dexcom) Endocrine Self-Management Outcome: 4 (good)  Gastrointestinal Gastrointestinal Symptoms Reported: Nausea Additional Gastrointestinal Details: Nausea due to Ozempic - reports normal BM daily Gastrointestinal Management Strategies: Adequate rest Gastrointestinal Self-Management Outcome: 3 (uncertain)    Genitourinary Genitourinary Symptoms Reported: No symptoms reported Genitourinary Management Strategies: Adequate rest Genitourinary Self-Management Outcome: 4 (good)  Integumentary Integumentary Symptoms Reported: No symptoms reported Skin Management Strategies: Routine screening Skin Self-Management Outcome: 4 (good)  Musculoskeletal Musculoskelatal Symptoms Reviewed: Back pain Additional Musculoskeletal Details: chronic back pain Musculoskeletal Management Strategies: Routine screening Musculoskeletal Self-Management Outcome: 4 (good) Falls in the past year?: Yes Number of falls in past year: 1 or less Was there an injury with Fall?: No Fall Risk Category Calculator: 1 Patient Fall Risk Level: Low Fall Risk Patient at Risk for Falls Due to: History of fall(s) Fall risk Follow up: Falls prevention discussed  Psychosocial Psychosocial Symptoms Reported: Depression - if selected complete PHQ 2-9 Additional Psychological Details: Still reports seeing psychiatrist Behavioral Management Strategies: Medication therapy, Counseling, Support system Behavioral Health Self-Management Outcome: 3 (uncertain) Major Change/Loss/Stressor/Fears (CP): Denies Techniques to Cope with Loss/Stress/Change: Support group Quality of Family Relationships: supportive, helpful,  involved Do you feel physically threatened by others?: No    10/13/2024    PHQ2-9 Depression Screening   Little interest or pleasure in doing things Not at all  Feeling down, depressed, or hopeless Several days  PHQ-2 - Total Score 1  Trouble falling or staying asleep, or sleeping too much    Feeling tired  or having little energy    Poor appetite or overeating     Feeling bad about yourself - or that you are a failure or have let yourself or your family down    Trouble concentrating on things, such as reading the newspaper or watching television    Moving or speaking so slowly that other people could have noticed.  Or the opposite - being so fidgety or restless that you have been moving around a lot more than usual    Thoughts that you would be better off dead, or hurting yourself in some way    PHQ2-9 Total Score    If you checked off any problems, how difficult have these problems made it for you to do your work, take care of things at home, or get along with other people    Depression Interventions/Treatment      There were no vitals filed for this visit. Pain Scale: 0-10 Pain Score: 0-No pain  Medications Reviewed Today     Reviewed by Leodis Warren DEL, RN (Registered Nurse) on 10/13/24 at 1121  Med List Status: <None>   Medication Order Taking? Sig Documenting Provider Last Dose Status Informant  albuterol  (VENTOLIN  HFA) 108 (90 Base) MCG/ACT inhaler [id number] Yes INHALE 2 PUFFS BY MOUTH EVERY 6 HOURS AS NEEDED FOR WHEEZING OR SHORTNESS OF SHERIDA Bernardo Fend, DO  Active   ALPRAZolam  (XANAX ) 1 MG tablet [id number]  Take 1 mg by mouth 2 (two) times daily.  Patient not taking: Reported on 10/13/2024   [provider]  Active   amLODipine  (NORVASC ) 2.5 MG tablet [id number] Yes Take 1 tablet (2.5 mg total) by mouth daily. Bernardo Fend, DO  Active   ARIPiprazole  (ABILIFY ) 15 MG tablet [id number] Yes Take 15 mg by mouth every morning. [provider]  Active  Self, Multiple Informants  benzonatate  (TESSALON ) 100 MG capsule [id number]  Take 1 capsule (100 mg total) by mouth 2 (two) times daily as needed for cough.  Patient not taking: Reported on 10/13/2024   Bernardo Fend, DO  Active   blood glucose meter kit and supplies KIT [id number] Yes Dispense based on patient and insurance preference. Use up to four times daily as directed. Bernardo Fend, DO  Active   clopidogrel  (PLAVIX ) 75 MG tablet [id number] Yes Take 75 mg by mouth daily. [provider]  Active Self, Multiple Informants  Continuous Glucose Receiver (DEXCOM G7 RECEIVER) DEVI [id number]  Inject 1 each into the skin See admin instructions.  Patient not taking: Reported on 10/13/2024   Bernardo Fend, DO  Active   Continuous Glucose Receiver (FREESTYLE LIBRE 2 READER) ESPIRIDION [id number]  Patient lost her's  Patient not taking: Reported on 10/13/2024   Bernardo Fend, DO  Active   Continuous Glucose Sensor (DEXCOM G7 Platter) [place] [id number]  Apply every 10days  Patient not taking: Reported on 10/13/2024   Bernardo Fend, DO  Active   Continuous Glucose Sensor (FREESTYLE LIBRE 2 SENSOR) [place] [id number]  Inject 1 each into the skin every 14 (fourteen) days. Bernardo Fend, DO  Active   diazepam  (VALIUM ) 10 MG tablet [id number] Yes Take 10 mg by mouth 3 (three) times daily. [provider]  Active Self, Multiple Informants           Med Note MARISA, NATHANEL LOISE Schaumann Oct 10, 2022  6:19 PM)    lamoTRIgine  (LAMICTAL ) 100 MG tablet [id number] Yes Take 1 tablet by mouth twice daily Bernardo Fend, DO  Active   levofloxacin  (LEVAQUIN )  500 MG tablet [id number]  Take 1 tablet (500 mg total) by mouth daily.  Patient not taking: Reported on 10/13/2024   Raford Lenis, MD  Active   lisinopril  (ZESTRIL ) 10 MG tablet [id number] Yes Take 1 tablet (10 mg total) by mouth daily. Bernardo Fend, DO  Active   metFORMIN  (GLUCOPHAGE -XR) 500 MG 24 hr tablet [id number] Yes Take 1 tablet (500 mg  total) by mouth 2 (two) times daily with a meal. Bernardo Fend, DO  Active   omeprazole  (PRILOSEC) 40 MG capsule [id number] Yes Take 1 capsule (40 mg total) by mouth daily. Bernardo Fend, DO  Active   oxyCODONE  (ROXICODONE ) 5 MG immediate release tablet [id number]  Take 1 tablet (5 mg total) by mouth every 4 (four) hours as needed for severe pain (pain score 7-10).  Patient not taking: Reported on 10/13/2024   Raford Lenis, MD  Active   potassium chloride  SA (KLOR-CON  M20) 20 MEQ tablet [id number] Yes TAKE ONE TABLET BY MOUTH TWICE DAILY FOR THREE DAYS. THEN TAKE ONE TABLET BY MOUTH ONCE DAILY WITH LASIX  Bernardo Fend, DO  Active   prazosin  (MINIPRESS ) 5 MG capsule [id number] Yes Take 5 mg by mouth at bedtime. [provider]  Active   promethazine  (PHENERGAN ) 12.5 MG tablet [id number] Yes Take 1 tablet (12.5 mg total) by mouth every 8 (eight) hours as needed for nausea or vomiting. Bernardo Fend, DO  Active   Semaglutide , 2 MG/DOSE, (OZEMPIC , 2 MG/DOSE,) 8 MG/3ML SOPN [id number] Yes Inject 2 mg into the skin once a week. Bernardo Fend, DO  Active   traZODone  (DESYREL ) 150 MG tablet [id number] Yes Take by mouth at bedtime.  Patient taking differently: Take by mouth at bedtime. Patient states she takes 200 mg   [provider]  Active            Med Note DORI, DAVINA E   Tue Dec 09, 2023  1:33 PM) Patient states she takes 200 mg at HS.   Vilazodone HCl (VIIBRYD) 40 MG TABS [id number] Yes Take 40 mg by mouth daily. [provider]  Active   Med List Note Rayne Arley HERO, CPhT 04/17/22 1014): [facility] Friend   [phone]             Recommendation:   Continue Current Plan of Care  Follow Up Plan:   Telephone follow up appointment date/time:  11/03/24 at 11 am  Warren Quivers RN CM Population Health-Complex Care Management [facility] [phone]

## 2024-10-13 NOTE — Patient Instructions (Signed)
 Visit Information  Stephanie [name] was given information about Medicaid Managed Care team care coordination services as a part of their [facility] Medicaid benefit.   If you would like to schedule transportation through your [facility] plan, please call the following number at least 2 days in advance of your appointment: [phone].   You can also use the MTM portal or MTM mobile app to manage your rides. Reimbursement for transportation is available through [facility]! For the portal, please go to [email or web address]  Call the [facility] Crisis Line at [phone], at any time, 24 hours a day, 7 days a week. If you are in danger or need immediate medical attention call 911.  Please see education materials related to DMII provided by MyChart link.  Care plan and visit instructions communicated with the patient verbally today. Patient agrees to receive a copy in MyChart. Active MyChart status and patient understanding of how to access instructions and care plan via MyChart confirmed with patient.     Telephone follow up appointment with Managed Medicaid care management team member scheduled for:11/03/24 at 11 am.  Warren Quivers RN CM Population Health-Complex Care Management [facility] [phone]   Following is a copy of your plan of care:   Goals Addressed             This Visit's Progress    VBCI RN Care Plan: Diabetes   Improving    Problems:  Chronic Disease Management support and education needs related to DMII Knowledge Deficits related to DMII  Goal: Over the next 2 months the Patient will attend all scheduled medical appointments: with providers and specialists pertaining to health maintenance as evidenced by patient report and/or chart review.         continue to work with Medical Illustrator and/or Social Worker to address care management and care coordination needs related to DMII as evidenced by adherence to care management team scheduled appointments      demonstrate Improved adherence to prescribed treatment plan for DMII as evidenced by an A1C of less than 7%- goal met demonstrate understanding of rationale for each prescribed medication as evidenced by patient verbalizing what medications she takes for her diabetes and why it is important for medication compliance.     verbalize understanding of plan for management of DMII as evidenced by following treatment plan- medication compliance, following a low carb diet, monitoring and recording BS daily and reporting any abnormal readings to provider.  Over the next 30 days, the patient will work with her PCP office to obtain continuous glucose monitor sensors- states she is waiting on approval for the Dexcom.   Interventions:  Diabetes Interventions: Provided education to patient about basic DM disease process Reviewed medications with patient and discussed importance of medication adherence Discussed plans with patient for ongoing care management follow up and provided patient with direct contact information for care management team Advised patient, providing education and rationale, to check cbg daily and record, calling provider for findings outside established parameters Screening for signs and symptoms of depression related to chronic disease state  Assessed social determinant of health barriers Discussed effect of illness on diabetes management and Sick Day Rules Lab Results  Component Value Date   HGBA1C 6.0 (A) 09/21/2024    Patient Self-Care Activities:  Follow up with provider on approval for Dexcom Continue to follow low carb diet Check feet daily Check BS daily and keep a record Notify provider of any abnormal results  Plan:  Telephone follow  up appointment with care management team member scheduled for:  11/03/24 at 11 am.

## 2024-10-15 ENCOUNTER — Other Ambulatory Visit (HOSPITAL_COMMUNITY): Payer: Self-pay

## 2024-10-15 ENCOUNTER — Telehealth: Payer: Self-pay | Admitting: Internal Medicine

## 2024-10-15 ENCOUNTER — Other Ambulatory Visit: Payer: Self-pay | Admitting: Internal Medicine

## 2024-10-15 DIAGNOSIS — K219 Gastro-esophageal reflux disease without esophagitis: Secondary | ICD-10-CM

## 2024-10-15 NOTE — Telephone Encounter (Signed)
 Copied from CRM [id number]. Topic: Clinical - Prescription Issue >> Oct 15, 2024  1:08 PM Robinson H wrote: Reason for CRM: Patient following up on to see if her Continuous Glucose Receiver (DEXCOM G7 RECEIVER) DEV was approved by Medicaid, system shows device was sent to pharmacy and discontinued on 2/2. States originally it was denied by insurance.  [place] [phone]

## 2024-10-15 NOTE — Telephone Encounter (Signed)
 To help increase the likelihood of successful prior authorization for a continuous glucose monitor, please review the documentation and include evidence of at least two hypoglycemic events (insurers often require glucose values <=54 mg/dL) and/or clear documentation that the patient relies on insulin  to adequately control their diabetes. Thank you for your partnership.    Good afternoon Stephanie [name], I will be more than happy to send a PA for Dexcom but I don't see that she is on insulin , I do see that she is currently taking Ozempic  a GLP but no insulin .  Please advise because the PA will be denied without documentation of insulin  use or documentation of hypoglycemic events.  Thanks

## 2024-10-20 ENCOUNTER — Ambulatory Visit

## 2024-11-03 ENCOUNTER — Telehealth

## 2025-03-21 ENCOUNTER — Ambulatory Visit: Admitting: Internal Medicine
